# Patient Record
Sex: Male | Born: 1940 | Race: White | Hispanic: No | Marital: Married | Smoking: Former smoker
Health system: Southern US, Community
[De-identification: ages and names within clinical notes are randomized; demographics above are authoritative.]

## PROBLEM LIST (undated history)

## (undated) DIAGNOSIS — Z992 Dependence on renal dialysis: Secondary | ICD-10-CM

## (undated) DIAGNOSIS — I2510 Atherosclerotic heart disease of native coronary artery without angina pectoris: Secondary | ICD-10-CM

## (undated) DIAGNOSIS — E119 Type 2 diabetes mellitus without complications: Secondary | ICD-10-CM

## (undated) DIAGNOSIS — Z9289 Personal history of other medical treatment: Secondary | ICD-10-CM

## (undated) DIAGNOSIS — J189 Pneumonia, unspecified organism: Secondary | ICD-10-CM

## (undated) DIAGNOSIS — I739 Peripheral vascular disease, unspecified: Secondary | ICD-10-CM

## (undated) DIAGNOSIS — I350 Nonrheumatic aortic (valve) stenosis: Secondary | ICD-10-CM

## (undated) DIAGNOSIS — I219 Acute myocardial infarction, unspecified: Secondary | ICD-10-CM

## (undated) DIAGNOSIS — J449 Chronic obstructive pulmonary disease, unspecified: Secondary | ICD-10-CM

## (undated) DIAGNOSIS — I639 Cerebral infarction, unspecified: Secondary | ICD-10-CM

## (undated) DIAGNOSIS — I209 Angina pectoris, unspecified: Secondary | ICD-10-CM

## (undated) DIAGNOSIS — I4891 Unspecified atrial fibrillation: Secondary | ICD-10-CM

## (undated) DIAGNOSIS — Z952 Presence of prosthetic heart valve: Secondary | ICD-10-CM

## (undated) DIAGNOSIS — Z950 Presence of cardiac pacemaker: Secondary | ICD-10-CM

## (undated) DIAGNOSIS — I442 Atrioventricular block, complete: Secondary | ICD-10-CM

## (undated) DIAGNOSIS — N186 End stage renal disease: Secondary | ICD-10-CM

## (undated) DIAGNOSIS — D509 Iron deficiency anemia, unspecified: Secondary | ICD-10-CM

## (undated) DIAGNOSIS — E785 Hyperlipidemia, unspecified: Secondary | ICD-10-CM

## (undated) DIAGNOSIS — Z8719 Personal history of other diseases of the digestive system: Secondary | ICD-10-CM

## (undated) DIAGNOSIS — Z8711 Personal history of peptic ulcer disease: Secondary | ICD-10-CM

## (undated) DIAGNOSIS — I10 Essential (primary) hypertension: Secondary | ICD-10-CM

## (undated) DIAGNOSIS — F40240 Claustrophobia: Secondary | ICD-10-CM

## (undated) DIAGNOSIS — I509 Heart failure, unspecified: Secondary | ICD-10-CM

## (undated) HISTORY — PX: CARDIAC CATHETERIZATION: SHX172

## (undated) HISTORY — DX: Peripheral vascular disease, unspecified: I73.9

## (undated) HISTORY — DX: Essential (primary) hypertension: I10

## (undated) HISTORY — DX: Acute myocardial infarction, unspecified: I21.9

## (undated) HISTORY — PX: COLONOSCOPY W/ BIOPSIES AND POLYPECTOMY: SHX1376

## (undated) HISTORY — DX: Hyperlipidemia, unspecified: E78.5

## (undated) HISTORY — DX: Unspecified atrial fibrillation: I48.91

## (undated) HISTORY — PX: TONSILLECTOMY: SUR1361

## (undated) HISTORY — PX: CORONARY ANGIOPLASTY: SHX604

---

## 1979-07-28 HISTORY — PX: CHOLECYSTECTOMY OPEN: SUR202

## 1989-07-27 HISTORY — PX: CATARACT EXTRACTION W/ INTRAOCULAR LENS  IMPLANT, BILATERAL: SHX1307

## 1998-06-21 ENCOUNTER — Emergency Department (HOSPITAL_COMMUNITY): Admission: EM | Admit: 1998-06-21 | Discharge: 1998-06-21 | Payer: Self-pay | Admitting: Emergency Medicine

## 1998-10-07 ENCOUNTER — Encounter: Payer: Self-pay | Admitting: Emergency Medicine

## 1998-10-07 ENCOUNTER — Emergency Department (HOSPITAL_COMMUNITY): Admission: EM | Admit: 1998-10-07 | Discharge: 1998-10-08 | Payer: Self-pay | Admitting: Emergency Medicine

## 1999-06-20 ENCOUNTER — Ambulatory Visit (HOSPITAL_COMMUNITY): Admission: RE | Admit: 1999-06-20 | Discharge: 1999-06-20 | Payer: Self-pay | Admitting: *Deleted

## 1999-06-20 ENCOUNTER — Encounter: Payer: Self-pay | Admitting: *Deleted

## 1999-10-12 ENCOUNTER — Emergency Department (HOSPITAL_COMMUNITY): Admission: EM | Admit: 1999-10-12 | Discharge: 1999-10-12 | Payer: Self-pay | Admitting: Emergency Medicine

## 1999-10-12 ENCOUNTER — Encounter: Payer: Self-pay | Admitting: Emergency Medicine

## 2003-04-11 ENCOUNTER — Inpatient Hospital Stay (HOSPITAL_COMMUNITY): Admission: EM | Admit: 2003-04-11 | Discharge: 2003-04-14 | Payer: Self-pay | Admitting: Emergency Medicine

## 2003-04-11 ENCOUNTER — Encounter: Payer: Self-pay | Admitting: Emergency Medicine

## 2003-04-12 ENCOUNTER — Encounter: Payer: Self-pay | Admitting: Family Medicine

## 2003-04-13 ENCOUNTER — Encounter: Payer: Self-pay | Admitting: *Deleted

## 2005-11-20 ENCOUNTER — Emergency Department (HOSPITAL_COMMUNITY): Admission: EM | Admit: 2005-11-20 | Discharge: 2005-11-20 | Payer: Self-pay | Admitting: Emergency Medicine

## 2005-11-27 ENCOUNTER — Encounter: Admission: RE | Admit: 2005-11-27 | Discharge: 2005-11-27 | Payer: Self-pay | Admitting: Nephrology

## 2007-08-28 ENCOUNTER — Ambulatory Visit (HOSPITAL_COMMUNITY): Admission: RE | Admit: 2007-08-28 | Discharge: 2007-08-29 | Payer: Self-pay | Admitting: Ophthalmology

## 2007-08-28 ENCOUNTER — Encounter (INDEPENDENT_AMBULATORY_CARE_PROVIDER_SITE_OTHER): Payer: Self-pay | Admitting: Ophthalmology

## 2007-11-13 DIAGNOSIS — D638 Anemia in other chronic diseases classified elsewhere: Secondary | ICD-10-CM | POA: Insufficient documentation

## 2009-01-10 ENCOUNTER — Emergency Department (HOSPITAL_COMMUNITY): Admission: EM | Admit: 2009-01-10 | Discharge: 2009-01-10 | Payer: Self-pay | Admitting: Emergency Medicine

## 2009-01-14 ENCOUNTER — Inpatient Hospital Stay (HOSPITAL_COMMUNITY): Admission: EM | Admit: 2009-01-14 | Discharge: 2009-01-16 | Payer: Self-pay | Admitting: Emergency Medicine

## 2009-08-07 ENCOUNTER — Emergency Department (HOSPITAL_COMMUNITY): Admission: EM | Admit: 2009-08-07 | Discharge: 2009-08-07 | Payer: Self-pay | Admitting: Emergency Medicine

## 2009-08-14 ENCOUNTER — Encounter: Admission: RE | Admit: 2009-08-14 | Discharge: 2009-08-14 | Payer: Self-pay | Admitting: Orthopedic Surgery

## 2009-09-02 ENCOUNTER — Emergency Department (HOSPITAL_COMMUNITY): Admission: EM | Admit: 2009-09-02 | Discharge: 2009-09-02 | Payer: Self-pay | Admitting: Emergency Medicine

## 2009-09-12 ENCOUNTER — Emergency Department (HOSPITAL_COMMUNITY): Admission: EM | Admit: 2009-09-12 | Discharge: 2009-09-12 | Payer: Self-pay | Admitting: Emergency Medicine

## 2009-12-05 DIAGNOSIS — J309 Allergic rhinitis, unspecified: Secondary | ICD-10-CM | POA: Insufficient documentation

## 2010-01-19 DIAGNOSIS — I10 Essential (primary) hypertension: Secondary | ICD-10-CM | POA: Insufficient documentation

## 2010-11-16 ENCOUNTER — Encounter
Admission: RE | Admit: 2010-11-16 | Discharge: 2010-11-16 | Payer: Self-pay | Source: Home / Self Care | Attending: Family Medicine | Admitting: Family Medicine

## 2011-03-13 LAB — BASIC METABOLIC PANEL
BUN: 27 mg/dL — ABNORMAL HIGH (ref 6–23)
CO2: 26 mEq/L (ref 19–32)
Calcium: 8.4 mg/dL (ref 8.4–10.5)
Chloride: 98 mEq/L (ref 96–112)
GFR calc Af Amer: 48 mL/min — ABNORMAL LOW (ref >60)
GFR calc non Af Amer: 49 mL/min — ABNORMAL LOW (ref >60)
Glucose, Bld: 244 mg/dL — ABNORMAL HIGH (ref 70–99)
Potassium: 3.9 mEq/L (ref 3.5–5.1)
Potassium: 4.2 mEq/L (ref 3.5–5.1)
Sodium: 132 mEq/L — ABNORMAL LOW (ref 135–145)
Sodium: 134 mEq/L — ABNORMAL LOW (ref 135–145)

## 2011-03-13 LAB — CBC
HCT: 36.5 % — ABNORMAL LOW (ref 39.0–52.0)
HCT: 36.6 % — ABNORMAL LOW (ref 39.0–52.0)
Hemoglobin: 13.1 g/dL (ref 13.0–17.0)
MCHC: 34.8 g/dL (ref 30.0–36.0)
MCHC: 35.9 g/dL (ref 30.0–36.0)
MCV: 91.6 fL (ref 78.0–100.0)
Platelets: 244 10*3/uL (ref 150–400)
RBC: 3.98 MIL/uL — ABNORMAL LOW (ref 4.22–5.81)
RBC: 4.45 MIL/uL (ref 4.22–5.81)
RDW: 12.5 % (ref 11.5–15.5)
WBC: 11.5 10*3/uL — ABNORMAL HIGH (ref 4.0–10.5)
WBC: 7.3 10*3/uL (ref 4.0–10.5)
WBC: 7.7 10*3/uL (ref 4.0–10.5)

## 2011-03-13 LAB — GLUCOSE, CAPILLARY
Glucose-Capillary: 173 mg/dL — ABNORMAL HIGH (ref 70–99)
Glucose-Capillary: 174 mg/dL — ABNORMAL HIGH (ref 70–99)
Glucose-Capillary: 248 mg/dL — ABNORMAL HIGH (ref 70–99)
Glucose-Capillary: 258 mg/dL — ABNORMAL HIGH (ref 70–99)
Glucose-Capillary: 273 mg/dL — ABNORMAL HIGH (ref 70–99)
Glucose-Capillary: 281 mg/dL — ABNORMAL HIGH (ref 70–99)

## 2011-03-13 LAB — LIPID PANEL
Cholesterol: 146 mg/dL (ref 0–200)
HDL: 22 mg/dL — ABNORMAL LOW (ref >39)
LDL Cholesterol: 82 mg/dL (ref 0–99)
Total CHOL/HDL Ratio: 6.6 RATIO

## 2011-03-13 LAB — CULTURE, BLOOD (ROUTINE X 2)

## 2011-03-13 LAB — POCT I-STAT, CHEM 8
BUN: 38 mg/dL — ABNORMAL HIGH (ref 6–23)
Calcium, Ion: 1.11 mmol/L — ABNORMAL LOW (ref 1.12–1.32)
Creatinine, Ser: 1.8 mg/dL — ABNORMAL HIGH (ref 0.4–1.5)
Glucose, Bld: 256 mg/dL — ABNORMAL HIGH (ref 70–99)
TCO2: 25 mmol/L (ref 0–100)

## 2011-03-13 LAB — DIFFERENTIAL
Basophils Absolute: 0.2 10*3/uL — ABNORMAL HIGH (ref 0.0–0.1)
Basophils Relative: 2 % — ABNORMAL HIGH (ref 0–1)
Eosinophils Relative: 2 % (ref 0–5)
Lymphocytes Relative: 19 % (ref 12–46)

## 2011-03-13 LAB — SEDIMENTATION RATE: Sed Rate: 53 mm/hr — ABNORMAL HIGH (ref 0–16)

## 2011-03-13 LAB — TSH: TSH: 3.659 u[IU]/mL (ref 0.350–4.500)

## 2011-03-13 LAB — BODY FLUID CULTURE

## 2011-03-14 ENCOUNTER — Encounter: Payer: Self-pay | Admitting: Physician Assistant

## 2011-04-10 NOTE — Op Note (Signed)
NAMEDANDRAE, BOWLING               ACCOUNT NO.:  [id number]   MEDICAL RECORD NO.:  [id number]          PATIENT TYPE:  OIB   LOCATION:  2550                         FACILITY:  [facility]   PHYSICIAN:  Chrystie Nose. Zigmund Daniel, M.D. DATE OF BIRTH:  1941/02/16   DATE OF PROCEDURE:  08/28/2007  DATE OF DISCHARGE:                               OPERATIVE REPORT   ADMISSION DIAGNOSES:  1. Dislocated intraocular lens in the vitreous, right eye.  2. Diabetic retinopathy, right eye.  3. Retained lens material, right eye.   PROCEDURES:  Pars plana vitrectomy with 25-gauge system, removal of  intraocular lens from the vitreous, panretinal photocoagulation,  secondary intraocular lens placement with suture in the right eye.   SURGEON:  Chrystie Nose. Zigmund Daniel, M.D.   ASSISTANT:  Thurman Coyer. Lundquist, P.A.   ANESTHESIA:  General.   DETAILS:  Usual prep and drape, limbal peritomy from 8 o'clock to 4  o'clock.  Scleral flaps raised at 3 o'clock and 9 o'clock in  anticipation of IOL suture, 25 gauge trocars placed at 8, 10 and 2  o'clock.  A three-layered diamond knife corneoscleral wound was created  from 10 o'clock to 2 o'clock, incorporating the previous cataract wound.  Pars plana vitrectomy was begun just behind the pupillary axis.  The 25-  gauge probe was placed in the anterior chamber and removal of lens  material was performed from this area.  The vitrectomy was carried  posteriorly, where additional lens material was seen and removed with  the 25-gauge system.  The vitrectomy was carried out to the periphery,  where the intraocular lens was found.  The lens was ensnarled in  vitreous.  The 25-gauge system was used to remove the vitreous carefully  from the intraocular lens and free it in the vitreous cavity.  The  vitrectomy was carried out to the vitreous base, where the vitreous was  trimmed.  The endolaser was positioned in the eye, 2666 burns were  placed around the retinal periphery with a power  between 400 and 1500  milliwatts, 1000 microns each and 0.1 second each.  A full panretinal  photocoagulation was performed..  The corneoscleral wound was opened  with a diamond knife.  The intraocular lens was passed with 25-gauge  forceps through the pupil into the anterior chamber, where it was  grasped with 20-gauge serrated forceps and removed from the eye.  The  lens was sent for gross study.  A new intraocular lens made by [employer], [id number], power 22.0 D, length 12.5 mm, optic 7.0 mm,  serial number [id number], was brought onto the field.  The expiration  date was October 2012.  The lens was inspected and cleaned.  Two Prolene  sutures were passed beneath the scleral flaps at 3 and 9 o'clock behind  the iris and anterior to the capsular remnants.  The sutures were  externalized through the corneoscleral wound, attached to the  intraocular lens, and the intraocular lens was passed into the posterior  chamber and dialed into place.  The sutures were knotted externally  beneath the scleral flaps.  The corneoscleral wound was closed with four  10-0 interrupted nylon sutures.  Additional vitrectomy was performed to  remove hemorrhage from the vitreous cavity and the retinal surface.  Some additional lens fragments were seen and removed.  An 80% gas-fluid  exchange was performed.  The 25-gauge trocars were removed from the eye.  The wounds were tested and found to be tight.  The conjunctiva was  closed with 7-0 chromic suture.  Polymyxin and gentamicin were irrigated  into [place]'s, Marcaine was injected around the globe for postop pain.  Decadron 10 mg was injected into the lower subconjunctival space.  TobraDex ophthalmic ointment, a patch and shield were placed.  The  closing pressure was 10 with a Barraquer keratometer.   COMPLICATIONS:  None.   DURATION:  1 hour 30 minutes.   The patient was awakened and taken to recovery in satisfactory  condition.       Chrystie Nose. Zigmund Daniel, M.D.  Electronically Signed     JDM/MEDQ  D:  08/28/2007  T:  08/29/2007  Job:  [id number]

## 2011-04-10 NOTE — H&P (Signed)
NAMEHEITOR, Jeffrey [name]               ACCOUNT NO.:  [id number]   MEDICAL RECORD NO.:  [id number]          PATIENT TYPE:  INP   LOCATION:  1425                         FACILITY:  [facility]   PHYSICIAN:  Karlyn Agee, M.D. DATE OF BIRTH:  July 12, 1941   DATE OF ADMISSION:  01/13/2009  DATE OF DISCHARGE:                              HISTORY & PHYSICAL   PRIMARY CARE PHYSICIAN:  Rachell Cipro, M.D.   CHIEF COMPLAINT:  Pain and swelling and inflammation of the left ankle  and leg.   HISTORY OF PRESENT ILLNESS:  This is a 70 year old obese Caucasian  gentleman with a history of diabetes, hypertension and coronary artery  disease who got up out of bed in the dark up to 3 nights ago and slipped  and twisted his left ankle.  The patient experienced pain not only in  the ankle and foot but also up in the leg, came to the emergency room,  was evaluated, had x-rays of the ankle done which revealed no acute  fracture.  He was discharged home with pain medications.  Unfortunately,  the prescription was not signed and he could not fill the prescription.  He returned today because of ecchymosis, redness of the ankle, leg pain  extending further up the leg, and also fever and chills.  In the  emergency room, the patient was evaluated and felt to have a cellulitis  and the hospitalist service called to assist with management.  The  patient says the location of the pain has been constant since his injury  and that the pain has always been extending up his leg.  He does admit  to subjective fever and chills and he says he is completely unable to  bear weight on the foot.   He also gives a history of orthopnea and PND and dyspnea with exertion.   PAST MEDICAL HISTORY:  1. Diabetes.  2. Hypertension.  3. Hyperlipidemia.  4. Coronary artery disease.   MEDICATIONS:  1. Amlodipine 10 mg daily.  2. Aspirin 81 mg daily.  3. Glipizide 10 mg twice daily.  4. HCTZ 25 mg daily.  5. Lisinopril 20 mg twice  daily.  6. Metformin 500 mg twice daily.  7. Nitroglycerin sublingually p.r.n.  8. Simvastatin 20 mg at bedtime.   ALLERGIES:  BYETTA causes nausea.   SOCIAL HISTORY:  Denies tobacco, alcohol or illicit drug use.  He is a  retired Nature conservation officer.   FAMILY HISTORY:  Significant for diabetes and hypertension.   REVIEW OF SYSTEMS:  Other than noted above, a 10-point review of systems  was unremarkable.   PHYSICAL EXAM:  Mildly obese Caucasian gentleman reclining in the  stretcher in no acute distress at this time.  VITALS:  His temperature is 98.0, pulse 104, respirations 20, blood  pressure 136/94, he is saturating at 95% on room air.  Pupils are round  and equal, mucous membranes pink and anicteric, no cervical  lymphadenopathy or thyromegaly noted.  No carotid bruits.  CHEST:  Clear to auscultation bilaterally.  CARDIOVASCULAR SYSTEM:  Regular rhythm.  ABDOMEN:  Obese and soft.  He has  right upper quadrant incisional hernia  stemming  from his cholecystectomy.  EXTREMITIES:  He has edema of both legs to just below the knees, left  greater than right.  He has extensive ecchymosis around the left ankle extending down onto  the dorsum of his left foot.  There is no ecchymosis of the leg.  However, it is tender up the leg towards the knee.  He is unable to flex  or dorsiflex the left foot if held in plantar flex position.  CENTRAL NERVOUS SYSTEM:  Cranial nerves II-XII are grossly intact.  He  has no focal lateralizing signs.   LABS:  His white count is 11.5, hemoglobin 14.2, platelets 310.  Absolute granulocyte count is mildly elevated at 8.1.  His serum  chemistry is significant for a low sodium of 133, elevated potassium of  5.1 and a BUN elevated at 38, creatinine 1.8.  Six months ago his BUN  and creatinine were normal.  His glucose is elevated to 256.  X-rays of  the foot revealed no evidence of arthropathy, fracture or dislocation.   ASSESSMENT:  1. Acute injury to  the left ankle and foot without any evidence of      fracture so far.  2. Acute pain and inability to ambulate.  3. Questionable cellulitis.  4. Diabetes type 2, uncontrolled.  5. Hypertension, controlled.  6. History of hyperlipidemia.  7. Acute renal failure.   PLAN:  1. Will admit this gentleman for hydration and better control of his      diabetes.  2. Will repeat plain x-rays to be sure an occult fracture does not      manifest, including x-rays of the tib/fib.  3. If there is no evidence of fracture, will do MRI of the ankle and      foot.  Will start him on Rocephin although despite the leukocytosis      and a subjective history of fever, cellulitis is not a convincing      diagnosis.  Will add sliding scale to his regimen.  Continue      glipizide and hold his metformin because of his renal failure.      Other plans as per orders.      Karlyn Agee, M.D.  Electronically Signed     LC/MEDQ  D:  01/14/2009  T:  01/14/2009  Job:  [id number]   cc:   Rachell Cipro, M.D.

## 2011-04-13 NOTE — Discharge Summary (Signed)
NAME:  Jeffrey [name], Jeffrey [name]                         ACCOUNT NO.:  [id number]   MEDICAL RECORD NO.:  [id number]                   PATIENT TYPE:  INP   LOCATION:  [id number]                                 FACILITY:  [facility]   PHYSICIAN:  Benito Mccreedy, M.D.             DATE OF BIRTH:  [birth year]   DATE OF ADMISSION:  04/11/2003  DATE OF DISCHARGE:  04/14/2003                                 DISCHARGE SUMMARY   DISCHARGE DIAGNOSES:  1. Chest pain, resolved.  No evidence of ischemia.  Cardiolite done on Apr 13, 2003.  2. History of ataxia, transient, possibly secondary to transient ischemic     attack.  Outpatient open MRI/MRA pending.  Outpatient follow-up and     complete workup recommended per primary care physician of patient.  3. Questionable history of myocardial infarction in the past.  4. Diabetes type 2.  5. Hypertension.  6. History of medical noncompliance.   DISCHARGE MEDICATIONS:  1. Aspirin 325 mg p.o. daily.  2. Glucotrol 10 mg p.o. b.i.d.  3. Lopressor 25 b.i.d.  4. Norvasc 10 daily.  5. Zestril 10 mg b.i.d.  6. The patient's Glucophage is currently on hold.   He had a Cardiolite test and, however, on discharge his CBGs have been less  than 200 mg/dl.   ACTIVITY:  As tolerated.   DIET:  Should be 2000-calorie ADA diet and cardiac diet.   DISCHARGE INSTRUCTIONS:  The patient is to report to M.D. if he experiences  any problems.   FOLLOW-UP:  He is to follow-up with Dr. Don Broach office in about two weeks.  The patient is to call for appointment.  Open MRI has been arranged for the  patient at 3:45 p.m. on Apr 23, 2003.  The patient is to call Linus Orn at [phone], and he has been furnished with this number.   HISTORY OF PRESENT ILLNESS:  The patient presented to the ED on the day of  admission with substernal chest pain following a heated argument with his  son.  He was also noted to have had some blurred vision and ataxia with left  facial numbness during  this period.  He was concerned that he might have had  some stroke.  His blood pressure was elevated at 182/89 in the ED during  admission evaluation.  He also had some mild headache, nausea, vomiting, and  indigestion which was subsequently relieved with Tums.  He complained of  generalized weakness and palpitations.  Cardiac and pulmonary exams were  unremarkable at the time of admission.  His EKG revealed some bradycardia  with third-degree AV block without any acute ST and T-wave changes.  On  account of his coronary disease and risk factors as well as questionable  history of prior myocardial infarction, the patient was admitted for further  evaluation.   HOSPITAL COURSE:  The patient was admitted to the hospitalist service.  Serial cardiac enzymes were obtained.  There was no evidence of myocardial  infarction.  He did not have any significant arrhythmias on telemetry  monitoring.  His symptoms resolved, both chest pain and neurologic symptoms,  spontaneously within less than 24 hours.  Cardiology was consulted, and  Cardiolite test was done.  Results are as indicated above.  They are  concerned about possible transient ischemic attack.  MRI was scheduled for  the patient, but he refused the test because he was claustrophobic.  We  tried to do MRI under sedation at [facility], which he also refused  for fear that he may wake up in the middle of the test and be scared to  death.  The patient is currently hemodynamically stable.  He does not  exhibit any neurologic symptoms.  He is being discharged home on aspirin and  his other medications.  He is to follow up with his primary care physician  for further workup of his neurologic complaints.  He is to get his open MRI  done as stated above.  The patient is discharged home in stable condition  __________.   PROCEDURE:  Cardiolite test, as mentioned above.   CONDITION ON DISCHARGE:  Stable and improved.   DISCHARGE  LABORATORY DATA:  WBC count 6.8, hemoglobin 14.4, hematocrit 40.4,  MCV 89.6, platelet count 218.  Sodium 135, potassium 3.7, chloride 105, CO2  27, glucose 161, BUN 12, creatinine 0.9, calcium 9.2.  Hemoglobin A1C 7.2%.  Total cholesterol 157, triglycerides 293, ADL 33, LDL 55.  Homocystine level  8.27 mcmol/L.                                               Benito Mccreedy, M.D.    GO/MEDQ  D:  04/14/2003  T:  04/15/2003  Job:  [id number]   cc:   Fabio Asa, M.D.  [place] [address]  Fax: [phone]   Ples Specter. Henrene Pastor, M.D.  [address]  [place] [address]  Fax: [phone]

## 2011-04-13 NOTE — Consult Note (Signed)
NAME:  Jeffrey [name], Jeffrey [name] NO.:  [id number]   MEDICAL RECORD NO.:  [id number]                   PATIENT TYPE:  INP   LOCATION:  [id number]                                 FACILITY:  [facility]   PHYSICIAN:  Fabio Asa, M.D.                 DATE OF BIRTH:  Jun 18, 1941   DATE OF CONSULTATION:  04/12/2003  DATE OF DISCHARGE:                                   CONSULTATION   REPORT TITLE:  CARDIOLOGY CONSULTATION.   REFERRING PHYSICIAN:  Eber Hong. Geni Bers, M.D.   INDICATION FOR CONSULTATION:  Chest pain.   HISTORY OF PRESENT ILLNESS:  The patient is a 70 year old gentleman known to  me from previous evaluation in 2001. He presented to the emergency room with  complaints of substernal chest pain following a heated argument with his  son. He was also noted to have blurred vision, ataxia and left facial  numbness. The patient was concerned that he may have had a stroke. He was  noted to have an elevated blood pressure of 182/89 in the emergency room.  His chief complaint upon arrival to the emergency room was mild headache,  nausea, vomiting and indigestion relieved by TUMS.  He had generalized  weakness and palpitations.   The patient relates to me that he did not keep his previous scheduled  cardiac catheterization appointment secondary to concerns regarding possible  risk. He has had increasing dyspnea. Serial cardiac enzymes have been  negative x3. His ECG reveals a bradycardiac rhythm with first degree AV  block. There are no acute ST changes noted. The patient described the  symptom as if someone was squeezing the breath out of him.   PAST MEDICAL HISTORY:  1. Significant for questionable myocardial infarction in the past.  2. Diabetes mellitus type 2.  3. Hypertension.  4. Medical noncompliance.   ALLERGIES:  No known drug allergies.   CURRENT MEDICATIONS:  1. Prinivil 10 mg daily.  2. Glucophage 1 g b.i.d.  3. Glucotrol XL 10 mg b.i.d.  4.  Enteric-coated aspirin 81 mg daily.   PAST SURGICAL HISTORY:  Significant for cholecystectomy.   SOCIAL HISTORY:  He is married. Stopped smoking 16 years prior. He had been  smoking up to four packs per day. No history of alcohol or illicit drug use.  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 He states that he climbs a 40 foot ladder and has had no  chest pain with this type of exertion.   FAMILY HISTORY:  Father had a CVA, insulin-dependent diabetes mellitus,  hypertension, and MI. Mother had a CVA and dementia.   REVIEW OF SYSTEMS:  Is as per HPI. He has had a recent left thumb injury  related to his circular saw.   PHYSICAL EXAMINATION:  GENERAL:  An elderly male, appearing older than his  stated age.  VITAL SIGNS:  Blood pressure 187/110, pulse 60, respiratory rate 20.  Telemetry reveals sinus rhythm to sinus bradycardia.  HEENT:  Unremarkable.  NECK:  No carotid bruits.  PULMONARY:  Examination reveals a large chest. Breath sounds are slightly  diminished.  CARDIAC:  Regular rate and rhythm. Heart tones are distant. There is no  appreciable murmur.  ABDOMEN:  Obese. Examination is made difficult by body habitus. There are  normal bowel sounds.  EXTREMITIES:  Reveal distal pulses which are equal and palpable. There is no  peripheral edema noted.  NEUROLOGICAL:  Nonfocal.  SKIN:  Warm and dry.   LABORATORY DATA:  White count 6.8, hematocrit 40.4.  Potassium 3.7,  creatinine 0.7. Serial cardiac enzymes negative as noted above.   ECG as noted above.   IMPRESSION:  1. This is a 70 year old gentleman with previous inferior defect     demonstrated on Cardiolite. Left heart catheterization recommended at     that time. The patient was hesitant to proceed. He remains hesitant to     proceed with cardiac catheterization. We discussed his options. Will     repeat his adenosine/Cardiolite. If his inferior defect is unchanged,      will not recommend further aggressive intervention and it sounds as if he     has activity limited primarily by motivation. He understands his comorbid     conditions including his diabetes, hypertension, and obesity are     contributing to coronary artery disease.  2. Hypertension. Blood pressure currently is poorly controlled. Would     increase both his Norvasc to 10 mg daily as well as his Lisinopril to 20     mg daily.  3. Dyslipidemia. If a recent cholesterol profile has not been obtained, his     LDL goal is less than 100, further recommendations pending the outcome of     his adenosine/Cardiolite.                                               Fabio Asa, M.D.    HP/MEDQ  D:  04/12/2003  T:  04/12/2003  Job:  [id number]

## 2011-08-04 ENCOUNTER — Emergency Department (HOSPITAL_COMMUNITY)
Admission: EM | Admit: 2011-08-04 | Discharge: 2011-08-04 | Disposition: A | Discharge status: Home / Self Care | Payer: Medicare Other | Attending: Emergency Medicine | Admitting: Emergency Medicine

## 2011-08-04 ENCOUNTER — Emergency Department (HOSPITAL_COMMUNITY): Payer: Medicare Other

## 2011-08-04 DIAGNOSIS — S61209A Unspecified open wound of unspecified finger without damage to nail, initial encounter: Secondary | ICD-10-CM | POA: Insufficient documentation

## 2011-08-04 DIAGNOSIS — I10 Essential (primary) hypertension: Secondary | ICD-10-CM | POA: Insufficient documentation

## 2011-08-04 DIAGNOSIS — E119 Type 2 diabetes mellitus without complications: Secondary | ICD-10-CM | POA: Insufficient documentation

## 2011-08-04 DIAGNOSIS — Y92009 Unspecified place in unspecified non-institutional (private) residence as the place of occurrence of the external cause: Secondary | ICD-10-CM | POA: Insufficient documentation

## 2011-08-04 DIAGNOSIS — W298XXA Contact with other powered powered hand tools and household machinery, initial encounter: Secondary | ICD-10-CM | POA: Insufficient documentation

## 2011-08-05 NOTE — Consult Note (Signed)
NAMEJAYGER, SCHMOCK NO.:  [id number]  MEDICAL RECORD NO.:  [id number]  LOCATION:  WLED                         FACILITY:  [facility]  PHYSICIAN:  Leanora Cover, MD        DATE OF BIRTH:  [birth year]  DATE OF CONSULTATION:  08/04/2011 DATE OF DISCHARGE:  08/04/2011                                CONSULTATION   Consult is from [facility] Emergency Department.  Consult is for left thumb table saw injury.  HISTORY:  Mr. Poyer is a 70 year old right-hand dominant male who states he was ripping a piece of wood when he cut him with a table saw, when the saw tipped lacerating his left thumb on the blade.  He was working in his own home.  He suffered a laceration to the volar aspect of the IP joint of the left thumb.  He came to [facility] Emergency Department where he was evaluated.  He was felt to have a tendon injury, was consulted for management of the condition.  Reports no previous injuries to the thumb and no other injuries at this time.  ALLERGIES:  No known drug allergies.  PAST MEDICAL HISTORY: 1. Diabetes. 2. Hypercholesterolemia. 3. Hypertension.  PAST SURGICAL HISTORY:  Cholecystectomy and lower back surgery.  MEDICATIONS:  Metformin, glipizide, amlodipine, Losartan, Januvia, and simvastatin.  SOCIAL HISTORY:  Mr. Colberg does not smoke and does not use alcohol.  FAMILY HISTORY:  Positive for diabetes and hypertension.  REVIEW OF SYSTEMS:  13-point review of systems negative.  PHYSICAL EXAMINATION:  VITAL SIGNS:  Temperature 98.3, pulse 87, respirations 20, and BP 144/73. HEAD:  Normocephalic and atraumatic. NECK:  Supple, full range of motion.  He is resting comfortably in the ER exam room.  He is cooperative with examination.  He is not complaining of any pain. EXTREMITIES:  Right upper extremity has intact sensation, capillary refill in all fingertips.  He can flex and extend the IP joint of thumb and cross his fingers.  No wounds.  No  tenderness to palpation.  Left upper extremity with the exception of the thumb, he has no wounds and no tenderness to palpation.  He has intact sensation and capillary refill in the fingertips.  He has full flexion and extension of the digits.  In the thumb, he has a laceration at the volar aspect of the IP joint of the thumb.  There is a small amount of skin loss.  He has decreased sensation in the tip of the thumb.  He has brisk capillary refill.  He is unable to flex at the IP joint.  Placing the IP joint in flexion, he is unable to maintain it there.  RADIOGRAPHY:  AP, lateral, and oblique views of the thumb showed no fractures, dislocations, or radiopaque foreign bodies.  ASSESSMENT AND PLAN:  Left thumb laceration with flexor pollicis longus laceration, possible digital nerve lacerations.  Discussed with Mr. Slader, the nature of the injury.  At this time, I recommended irrigation and debridement of wound in the emergency department with followup early this week for surgical planning for potential tendon and nerve repair.  PROCEDURE:  Digital block was performed with 10 cc of half  solution of 1% plain lidocaine and 0.5% plain Marcaine.  This is adequate to give total digital anesthesia to the left thumb.  The wound was copiously irrigated with sterile saline and Betadine solution.  1000 cc was used. There is no gross contamination.  5-0 or 4-0 nylon suture was used to oppose the skin edges.  There was good coverage of deeper tissues.  The wound was dressed with sterile Xeroform and 2 x 2's, and wrapped with a Kling bandage.  A thumb spica splint with the wrist and 30 degrees of flexion in the thumb in oppositions was placed.  The thumb tip was pink with brisk capillary refill after procedure.  I will give him Percocet 5/325 1-2 p.o. q.6 hours p.r.n. pain, dispensed #30 and doxycycline 100 mg p.o. b.i.d. x7 days.  I will follow up with him in the office on Monday for surgical  plan.     Leanora Cover, MD     KK/MEDQ  D:  08/04/2011  T:  08/04/2011  Job:  [id number]  Electronically Signed by Leanora Cover  on 08/05/2011 05:07:34 PM

## 2011-08-06 ENCOUNTER — Ambulatory Visit
Admission: RE | Admit: 2011-08-06 | Discharge: 2011-08-06 | Disposition: A | Discharge status: Home / Self Care | Payer: Medicare Other | Source: Ambulatory Visit | Attending: Orthopedic Surgery | Admitting: Orthopedic Surgery

## 2011-08-06 ENCOUNTER — Other Ambulatory Visit: Payer: Self-pay | Admitting: Orthopedic Surgery

## 2011-08-06 ENCOUNTER — Encounter (HOSPITAL_BASED_OUTPATIENT_CLINIC_OR_DEPARTMENT_OTHER)
Admission: RE | Admit: 2011-08-06 | Discharge: 2011-08-06 | Disposition: A | Discharge status: Home / Self Care | Payer: Medicare Other | Source: Ambulatory Visit | Attending: Orthopedic Surgery | Admitting: Orthopedic Surgery

## 2011-08-06 DIAGNOSIS — Z01811 Encounter for preprocedural respiratory examination: Secondary | ICD-10-CM

## 2011-08-06 LAB — BASIC METABOLIC PANEL
BUN: 27 mg/dL — ABNORMAL HIGH (ref 6–23)
Calcium: 9.7 mg/dL (ref 8.4–10.5)
GFR calc non Af Amer: 56 mL/min — ABNORMAL LOW (ref >60)
Glucose, Bld: 209 mg/dL — ABNORMAL HIGH (ref 70–99)
Sodium: 136 mEq/L (ref 135–145)

## 2011-08-07 ENCOUNTER — Emergency Department (HOSPITAL_COMMUNITY)
Admission: EM | Admit: 2011-08-07 | Discharge: 2011-08-07 | Disposition: A | Discharge status: Home / Self Care | Payer: Medicare Other | Attending: Emergency Medicine | Admitting: Emergency Medicine

## 2011-08-07 ENCOUNTER — Ambulatory Visit (HOSPITAL_BASED_OUTPATIENT_CLINIC_OR_DEPARTMENT_OTHER)
Admission: RE | Admit: 2011-08-07 | Discharge: 2011-08-07 | Disposition: A | Discharge status: Home / Self Care | Payer: Medicare Other | Source: Ambulatory Visit | Attending: Orthopedic Surgery | Admitting: Orthopedic Surgery

## 2011-08-07 DIAGNOSIS — W298XXA Contact with other powered powered hand tools and household machinery, initial encounter: Secondary | ICD-10-CM | POA: Insufficient documentation

## 2011-08-07 DIAGNOSIS — Z01812 Encounter for preprocedural laboratory examination: Secondary | ICD-10-CM | POA: Insufficient documentation

## 2011-08-07 DIAGNOSIS — E7800 Pure hypercholesterolemia, unspecified: Secondary | ICD-10-CM | POA: Insufficient documentation

## 2011-08-07 DIAGNOSIS — S61209A Unspecified open wound of unspecified finger without damage to nail, initial encounter: Secondary | ICD-10-CM | POA: Insufficient documentation

## 2011-08-07 DIAGNOSIS — Z01810 Encounter for preprocedural cardiovascular examination: Secondary | ICD-10-CM | POA: Insufficient documentation

## 2011-08-07 DIAGNOSIS — Y998 Other external cause status: Secondary | ICD-10-CM | POA: Insufficient documentation

## 2011-08-07 DIAGNOSIS — E119 Type 2 diabetes mellitus without complications: Secondary | ICD-10-CM | POA: Insufficient documentation

## 2011-08-07 DIAGNOSIS — I10 Essential (primary) hypertension: Secondary | ICD-10-CM | POA: Insufficient documentation

## 2011-08-07 DIAGNOSIS — Z01818 Encounter for other preprocedural examination: Secondary | ICD-10-CM | POA: Insufficient documentation

## 2011-08-07 DIAGNOSIS — I441 Atrioventricular block, second degree: Secondary | ICD-10-CM | POA: Insufficient documentation

## 2011-08-07 DIAGNOSIS — Y92009 Unspecified place in unspecified non-institutional (private) residence as the place of occurrence of the external cause: Secondary | ICD-10-CM | POA: Insufficient documentation

## 2011-08-07 DIAGNOSIS — IMO0002 Reserved for concepts with insufficient information to code with codable children: Secondary | ICD-10-CM | POA: Insufficient documentation

## 2011-08-07 DIAGNOSIS — S65509A Unspecified injury of blood vessel of unspecified finger, initial encounter: Secondary | ICD-10-CM | POA: Insufficient documentation

## 2011-08-07 LAB — GLUCOSE, CAPILLARY
Glucose-Capillary: 212 mg/dL — ABNORMAL HIGH (ref 70–99)
Glucose-Capillary: 163 mg/dL — ABNORMAL HIGH (ref 70–99)
Glucose-Capillary: 184 mg/dL — ABNORMAL HIGH (ref 70–99)

## 2011-08-07 LAB — POCT HEMOGLOBIN-HEMACUE: Hemoglobin: 13.3 g/dL (ref 13.0–17.0)

## 2011-08-08 NOTE — Op Note (Signed)
[name], [name]               ACCOUNT NO.:  [id number]  MEDICAL RECORD NO.:  [id number]  LOCATION:  [facility]                         FACILITY:  [facility]  PHYSICIAN:  Leanora Cover, MD        DATE OF BIRTH:  01-15-1941  DATE OF PROCEDURE:  08/07/2011 DATE OF DISCHARGE:  08/07/2011                              OPERATIVE REPORT   PREOPERATIVE DIAGNOSIS:  Left thumb flexor pollicis longus and digital nerve lacerations.  POSTOPERATIVE DIAGNOSIS:  Left thumb flexor pollicis longus, radial and ulnar digital nerve laceration, and radial digital artery laceration.  PROCEDURE:  Left thumb:  Repair of FPL, repair of radial and ulnar digital nerves, and repair of radial digital artery.  SURGEON:  Leanora Cover, MD  ASSISTANT:  Daryll Brod, MD  ANESTHESIA:  General with regional.  IV FLUIDS:  Per anesthesia flow sheet.  ESTIMATED BLOOD LOSS:  Minimal.  COMPLICATIONS:  None.  SPECIMENS:  None.  TOURNIQUET TIME:  81 minutes.  DISPOSITION:  Stable to PACU.  INDICATIONS:  Jeffrey [name] is a 70 year old male who lacerated his left thumb at the volar aspect of the IP joint on a table saw on Saturday. He presented to the [facility] Emergency Department where he was evaluated. He had decreased sensation of the thumb tip and inability to flex the IP joint.  The wound was irrigated and debrided and surgery planned for the outpatient surgical center.  Risks, benefits, and alternatives of surgery were discussed including the risk of blood loss; infection; damage to nerves, vessels, tendons, ligaments, bone; failure to surgery; need for additional surgery; complications with wound healing; continued pain and stiffness.  He voiced understanding of these risks and elected to proceed.  OPERATIVE COURSE:  After being identified preoperatively by myself, the patient and I agreed upon the procedure and site of procedure.  The surgical site was marked.  Risks, benefits, and alternatives of surgery were  reviewed and he wished to proceed.  Surgical consent had been signed.  He was given 1 g of IV Ancef as preoperative antibiotic prophylaxis.  Regional block was performed by Anesthesia in preoperative holding.  He was transferred to the operating room and placed on the operating table in supine position with left upper extremity on an arm board.  General anesthesia was induced by the anesthesiologist.  Left upper extremity was prepped and draped in normal sterile orthopedic fashion.  A surgical pause was performed between surgeons, Anesthesia, and operating staff and all were in agreement as to the patient, procedure, and site of procedure.  Tourniquet at the proximal aspect of the extremity was inflated to 250 mmHg after exsanguinated of the limb with an Esmarch bandage.  The wound on the thumb was extended in a Brunner-type fashion.  This was carried through the subcutaneous tissues.  There was noted to be a 100% FPL laceration.  The radial and ulnar digital nerves were identified and had been lacerated as well. There was laceration to the radial digital artery.  The ulnar digital artery was intact.  This was traced through the zone of injury.  The FPL was unable to be milked out through the sheath.  An incision was made at  the wrist.  The FCR tendon sheath was incised and the FCR was retracted ulnarly.  The FPL was easily identified.  It was brought out through this wound.  The edges were cleaned.  A 2-0 Prolene suture was used in a Bunnell-type fashion.  A pediatric feeding tube was placed through the tendon sheath.  The 2-0 Prolene suture was placed through this and it was pulled back through the tendon sheath.  The tendon was brought out to the level of the distal phalanx.  There was minimal stump remaining at the distal phalanx.  The Prolene was then passed around the edges of the bone and through the dorsum of the nail.  It was tied over a button with a Webril bumper.  This apposed  the tendon to the base of the distal phalanx well.  The microscope was brought in.  The radial digital artery was cleared of clot and repaired in an interrupted circumferential fashion using 9-0 nylon suture.  The ends had been freshened.  The radial and ulnar digital artery were identified and the stumps identified.  They were very damaged.  They were freshened as best possible.  A 9-0 nylon suture was again used in an interrupted fashion to oppose the nerve ends with a very slight gap to allow for nerve healing.  The wound was irrigated with sterile saline.  The skin was closed with 4-0 nylon in a horizontal mattress fashion.  The wound at the wrist was closed as well.  The wounds were dressed with sterile Xeroform and 4x4s and wrapped with a Kerlix bandage.  A dorsal blocking splint was placed with the wrist flexed to 30 degrees in the thumb and palmar opposition.  This was wrapped with Kerlix and Ace bandage.  The tourniquet was deflated at 81 minutes.  The thumb tip was pink with brisk capillary refill after deflation of the tourniquet.  The operative drapes were broken down.  The patient was awoken from anesthesia safely. He was transferred back to the stretcher and taken to PACU in stable condition.  I will see him back in the office in 1 week for postoperative followup.  I will give him Norco 5/325 one to two p.o. q.6 h. p.r.n. pain, dispensed #50.     Leanora Cover, MD     KK/MEDQ  D:  08/07/2011  T:  08/07/2011  Job:  [id number]  Electronically Signed by Leanora Cover  on 08/08/2011 02:44:45 PM

## 2011-09-05 NOTE — Consult Note (Signed)
NAME:  Jeffrey [name], Jeffrey [name] NO.:  [id number]  MEDICAL RECORD NO.:  [id number]  LOCATION:                                 FACILITY:  PHYSICIAN:  Mali Nachum Derossett, MD         DATE OF BIRTH:  08/22/1941  DATE OF CONSULTATION: DATE OF DISCHARGE:                                CONSULTATION   Mr. Tsung is a pleasant 70 year old male who had been seen by [place] in the past.  Around 2008, he had had some chest pain.  He has multiple risk factors for coronary artery disease.  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  He has not seen [place] since 2009.  Initially, there were plans to proceed with diagnostic catheterization, but the patient never had this done.  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  He went to [facility] and had tendon repair.  While being monitored, he had some transient Wenckebach without symptoms.  His baseline EKG even from 2009 shows sinus rhythm with first-degree AV block.  He denies any chest pain or shortness of breath.  He has not had palpitations or syncope.  His current medications are not completely known, but he is apparently not on a beta-blocker or another chronotropic agent.  He is on metformin, glipizide, Zocor, and Norvasc.  PAST MEDICAL HISTORY:  Remarkable for: 1. Treated hypertension. 2. Treated dyslipidemia. 3. Type 2 non-insulin-dependent diabetes. 4. He had cholecystectomy in the [id number] which was complicated by a     gangrenous gallbladder, this required sometime to heal.  SOCIAL HISTORY:  He has been married for 42 years.  He quit smoking in 1985.  He has 3 children and 5 grandchildren.  FAMILY HISTORY:  Remarkable that his father had diabetes.  REVIEW OF SYSTEMS:  Essentially unremarkable except for noted above.  PHYSICAL EXAMINATION:  VITAL SIGNS:  Blood pressure 164/84, pulse 77, temp 97, and O2  sat is 93%. GENERAL:  He is a well-developed, overweight male in no acute distress. HEENT:  Normocephalic.  Extraocular movements are intact.  Sclerae anicteric. NECK:  Without JVD.  Does have a soft transmitted murmur to both carotids. CHEST:  Clear to auscultation and percussion. CARDIAC:  Regular rate and rhythm with a 2/6 systolic murmur at the left sternal border and aortic valve area with the preserved S2. ABDOMEN:  Obese and nontender.  He has a large right upper quadrant surgical scar. EXTREMITIES:  Without edema.  Distal pulses are intact. NEURO:  Grossly intact.  He is awake, alert, oriented, and cooperative. Moves all extremities without obvious deficit. SKIN:  Cool and dry.  IMPRESSION: 1. Asymptomatic transient Wenckebach. 2. Baseline first-degree arteriovenous block. 3. Treated hypertension. 4. Type 2 non-insulin-dependent diabetes. 5. Treated dyslipidemia. 6. Ex-smoker.  PLAN:  The patient will be seen now by Dr. Debara Pickett as an outpatient.  He may need another echocardiogram and essentially he may have progressedto mild aortic stenosis from his last echo in 2008.     Erlene Quan, P.A.  ______________________________ Mali Lucilla Petrenko, MD    LKK/MEDQ  D:  08/07/2011  T:  08/08/2011  Job:  [id number]  Electronically Signed by Kerin Ransom P.A. on 09/05/2011 09:11:01 AM Electronically Signed by K. Zsofia Prout M.D. on 09/05/2011 08:42:25 PM

## 2011-09-06 LAB — BASIC METABOLIC PANEL
Calcium: 9.2
Creatinine, Ser: 1.12
GFR calc Af Amer: >60
GFR calc non Af Amer: >60
Sodium: 138

## 2011-09-06 LAB — CBC
Hemoglobin: 12.9 — ABNORMAL LOW
MCHC: 34.9
RBC: 4.07 — ABNORMAL LOW
WBC: 5.2

## 2012-07-31 ENCOUNTER — Encounter (HOSPITAL_COMMUNITY): Payer: Self-pay | Admitting: *Deleted

## 2012-07-31 ENCOUNTER — Emergency Department (HOSPITAL_COMMUNITY)
Admission: EM | Admit: 2012-07-31 | Discharge: 2012-08-01 | Disposition: A | Discharge status: Home / Self Care | Payer: Medicare Other | Attending: Gastroenterology | Admitting: Gastroenterology

## 2012-07-31 DIAGNOSIS — E119 Type 2 diabetes mellitus without complications: Secondary | ICD-10-CM | POA: Insufficient documentation

## 2012-07-31 DIAGNOSIS — T18108A Unspecified foreign body in esophagus causing other injury, initial encounter: Secondary | ICD-10-CM | POA: Insufficient documentation

## 2012-07-31 DIAGNOSIS — Z9089 Acquired absence of other organs: Secondary | ICD-10-CM | POA: Insufficient documentation

## 2012-07-31 DIAGNOSIS — Z79899 Other long term (current) drug therapy: Secondary | ICD-10-CM | POA: Insufficient documentation

## 2012-07-31 DIAGNOSIS — I10 Essential (primary) hypertension: Secondary | ICD-10-CM | POA: Insufficient documentation

## 2012-07-31 DIAGNOSIS — IMO0002 Reserved for concepts with insufficient information to code with codable children: Secondary | ICD-10-CM | POA: Insufficient documentation

## 2012-07-31 DIAGNOSIS — G8929 Other chronic pain: Secondary | ICD-10-CM | POA: Insufficient documentation

## 2012-07-31 DIAGNOSIS — M549 Dorsalgia, unspecified: Secondary | ICD-10-CM | POA: Insufficient documentation

## 2012-07-31 DIAGNOSIS — E785 Hyperlipidemia, unspecified: Secondary | ICD-10-CM | POA: Insufficient documentation

## 2012-07-31 MED ORDER — GLUCAGON HCL (RDNA) 1 MG IJ SOLR
1.0000 mg | Freq: Once | INTRAMUSCULAR | Status: AC
  Administered 2012-07-31: 1 mg via INTRAVENOUS
  Filled 2012-07-31: qty 1

## 2012-07-31 MED ORDER — ONDANSETRON HCL 4 MG/2ML IJ SOLN
4.0000 mg | Freq: Once | INTRAMUSCULAR | Status: AC
  Administered 2012-07-31: 4 mg via INTRAVENOUS
  Filled 2012-07-31: qty 2

## 2012-07-31 NOTE — ED Notes (Signed)
Pt states ate stew with potatoes/onions/etc about 90 mins ago; feels like has a potato stuck in his throat; able to speak in full sentences but states when he tries to drink something it comes back up; no resp distress

## 2012-07-31 NOTE — ED Notes (Addendum)
Pt was eating dinner tonight approximately 8PM, food became lodged in his throat.  Difficulty swallowing now.  Four months ago pt choked on a piece of food and wife had to perform heimleich and food was dislodged.  Pt reports at that time he couldn't breathe.  No breathing difficulty with this episode.

## 2012-07-31 NOTE — ED Provider Notes (Signed)
History     CSN: [id number]  Arrival date & time 07/31/12  2154   First MD Initiated Contact with Patient 07/31/12 2308      Chief Complaint  Patient presents with  . Foreign Body    (Consider location/radiation/quality/duration/timing/severity/associated sxs/prior treatment) Patient is a 71 y.o. male presenting with foreign body. The history is provided by the patient and the spouse.  Foreign Body  The current episode started 3 to 5 hours ago. The foreign body is suspected to be swallowed. The foreign body is food. The incident was witnessed. The incident was witnessed/reported by the patient. Pertinent negatives include no chest pain, no fever, no abdominal pain, no congestion, no choking and no difficulty breathing. He has been behaving normally. His past medical history does not include esophageal disease. There were no sick contacts. He has received no recent medical care.    Past Medical History  Diagnosis Date  . Diabetes mellitus   . Hyperlipidemia   . Hypertension   . Chronic back pain     Past Surgical History  Procedure Date  . Cholecystectomy     No family history on file.  History  Substance Use Topics  . Smoking status: Not on file  . Smokeless tobacco: Not on file  . Alcohol Use:       Review of Systems  Constitutional: Negative for fever.  HENT: Negative for congestion.   Respiratory: Negative for choking.   Cardiovascular: Negative for chest pain.  Gastrointestinal: Negative for abdominal pain.  All other systems reviewed and are negative.    Allergies  Byetta 10 mcg pen  Home Medications   Current Outpatient Rx  Name Route Sig Dispense Refill  . AMLODIPINE BESYLATE 10 MG PO TABS Oral Take 10 mg by mouth daily.      [name] GLIPIZIDE 10 MG PO TABS Oral Take 10 mg by mouth 2 (two) times daily before a meal.      . LOSARTAN POTASSIUM-HCTZ 100-25 MG PO TABS Oral Take 1 tablet by mouth daily.      [name] METFORMIN HCL 1000 MG PO TABS Oral Take 1,000 mg  by mouth 2 (two) times daily with a meal.      . SIMVASTATIN 20 MG PO TABS Oral Take 20 mg by mouth at bedtime.        BP 161/72  Pulse 85  Temp 98.3 F (36.8 C)  Resp 20  SpO2 99%  Physical Exam  Constitutional: He is oriented to person, place, and time. He appears well-developed and well-nourished. No distress.  HENT:  Head: Normocephalic and atraumatic.  Mouth/Throat: Oropharynx is clear and moist.  Eyes: Conjunctivae and EOM are normal.  Neck: Normal range of motion. Neck supple.  Cardiovascular: Normal rate and regular rhythm.   Pulmonary/Chest: Effort normal and breath sounds normal. No stridor. He has no wheezes. He has no rales.  Abdominal: Soft. Bowel sounds are normal. There is no tenderness. There is no rebound and no guarding.  Musculoskeletal: Normal range of motion.  Neurological: He is alert and oriented to person, place, and time.  Skin: Skin is warm and dry.  Psychiatric: He has a normal mood and affect.    ED Course  Procedures (including critical care time)  Labs Reviewed - No data to display No results found.   No diagnosis found.    MDM  To endoscopy        Vong Garringer K Neylan Koroma-Rasch, MD 08/01/12 [phone]

## 2012-08-01 ENCOUNTER — Other Ambulatory Visit (HOSPITAL_COMMUNITY): Payer: Self-pay | Admitting: Gastroenterology

## 2012-08-01 ENCOUNTER — Encounter (HOSPITAL_COMMUNITY): Payer: Self-pay | Admitting: *Deleted

## 2012-08-01 ENCOUNTER — Encounter (HOSPITAL_COMMUNITY)
Admission: EM | Disposition: A | Discharge status: Home / Self Care | Payer: Self-pay | Source: Home / Self Care | Attending: Emergency Medicine

## 2012-08-01 ENCOUNTER — Ambulatory Visit (HOSPITAL_COMMUNITY)
Admission: RE | Admit: 2012-08-01 | Discharge: 2012-08-01 | Disposition: A | Discharge status: Home / Self Care | Payer: Medicare Other | Source: Ambulatory Visit | Attending: Gastroenterology | Admitting: Gastroenterology

## 2012-08-01 DIAGNOSIS — R1310 Dysphagia, unspecified: Secondary | ICD-10-CM | POA: Insufficient documentation

## 2012-08-01 DIAGNOSIS — R070 Pain in throat: Secondary | ICD-10-CM

## 2012-08-01 DIAGNOSIS — K224 Dyskinesia of esophagus: Secondary | ICD-10-CM | POA: Insufficient documentation

## 2012-08-01 HISTORY — PX: ESOPHAGOGASTRODUODENOSCOPY: SHX5428

## 2012-08-01 SURGERY — EGD (ESOPHAGOGASTRODUODENOSCOPY)
Anesthesia: Moderate Sedation

## 2012-08-01 MED ORDER — NITROGLYCERIN 0.4 MG SL SUBL
0.4000 mg | SUBLINGUAL_TABLET | Freq: Once | SUBLINGUAL | Status: AC
  Administered 2012-08-01: 0.4 mg via SUBLINGUAL
  Filled 2012-08-01: qty 25

## 2012-08-01 NOTE — ED Notes (Signed)
[id number] Expected date:<BR> Expected time:<BR> Means of arrival:<BR> Comments:<BR> Pt in endo

## 2012-08-01 NOTE — ED Notes (Signed)
Endoscopy RN at bedside to take pt to endoscopy

## 2012-08-01 NOTE — Progress Notes (Signed)
Pt transferred to Endo unit for EGD d/t food impaction.  Pt was not vomiting or spitting.  Inquired if pt had any pain in throat or chest.  Pt stated some soreness/pain in throat.  Paged Dr. Watt Climes.  Per Dr. Watt Climes, gave pt small amount of water to drink to see if food impaction had passed.  Pt did drink water without any problem.  Pt did not vomit or choke from drinking the water.  Dr. Watt Climes informed.  Per Dr. Watt Climes, pt ok to be d/c'ed to home.  Pt to be scheduled for barium swallow later this morning and continue a liquid diet.  Pt informed of this info and Alesia Morin., RN in ED informed also.

## 2012-08-04 ENCOUNTER — Encounter (HOSPITAL_COMMUNITY): Payer: Self-pay

## 2012-08-04 ENCOUNTER — Encounter (HOSPITAL_COMMUNITY): Payer: Self-pay | Admitting: Gastroenterology

## 2012-11-21 DIAGNOSIS — Z87891 Personal history of nicotine dependence: Secondary | ICD-10-CM | POA: Insufficient documentation

## 2013-02-20 ENCOUNTER — Telehealth: Payer: Self-pay | Admitting: Physician Assistant

## 2013-02-20 NOTE — Telephone Encounter (Signed)
Pt has not been seen since June 2012.  We will refill  No medication until seen.  Refill glipizide returned to pharmacy denied. Tried to call patient.  No valid phone numbers.

## 2013-07-10 ENCOUNTER — Other Ambulatory Visit: Payer: Self-pay | Admitting: Physician Assistant

## 2013-07-13 NOTE — Telephone Encounter (Signed)
Pt has been dismissed. Only one month refill given.

## 2013-08-12 ENCOUNTER — Other Ambulatory Visit: Payer: Self-pay | Admitting: Physician Assistant

## 2014-02-14 ENCOUNTER — Other Ambulatory Visit: Payer: Self-pay | Admitting: Physician Assistant

## 2014-02-15 NOTE — Telephone Encounter (Signed)
Pt dismissed and has not been seen in a year.  Refill denied until pt contacts office

## 2014-04-04 ENCOUNTER — Other Ambulatory Visit: Payer: Self-pay | Admitting: Physician Assistant

## 2014-04-05 NOTE — Telephone Encounter (Signed)
Pt has not been seen in over a year.  Refill denied until contact office

## 2014-09-16 ENCOUNTER — Other Ambulatory Visit: Payer: Self-pay | Admitting: *Deleted

## 2014-09-16 ENCOUNTER — Encounter: Payer: Self-pay | Admitting: Vascular Surgery

## 2014-09-16 DIAGNOSIS — Z01810 Encounter for preprocedural cardiovascular examination: Secondary | ICD-10-CM

## 2014-09-16 DIAGNOSIS — N184 Chronic kidney disease, stage 4 (severe): Secondary | ICD-10-CM

## 2014-09-28 ENCOUNTER — Emergency Department (HOSPITAL_COMMUNITY): Payer: Medicare Other

## 2014-09-28 ENCOUNTER — Inpatient Hospital Stay (HOSPITAL_COMMUNITY)
Admission: EM | Admit: 2014-09-28 | Discharge: 2014-10-02 | DRG: 281 | Disposition: A | Discharge status: Home Health | Payer: Medicare Other | Attending: Internal Medicine | Admitting: Internal Medicine

## 2014-09-28 ENCOUNTER — Encounter (HOSPITAL_COMMUNITY): Payer: Self-pay | Admitting: Emergency Medicine

## 2014-09-28 DIAGNOSIS — R4182 Altered mental status, unspecified: Secondary | ICD-10-CM

## 2014-09-28 DIAGNOSIS — I4589 Other specified conduction disorders: Secondary | ICD-10-CM | POA: Diagnosis present

## 2014-09-28 DIAGNOSIS — R7989 Other specified abnormal findings of blood chemistry: Secondary | ICD-10-CM

## 2014-09-28 DIAGNOSIS — Z7982 Long term (current) use of aspirin: Secondary | ICD-10-CM

## 2014-09-28 DIAGNOSIS — E785 Hyperlipidemia, unspecified: Secondary | ICD-10-CM | POA: Diagnosis present

## 2014-09-28 DIAGNOSIS — Z87891 Personal history of nicotine dependence: Secondary | ICD-10-CM

## 2014-09-28 DIAGNOSIS — R410 Disorientation, unspecified: Secondary | ICD-10-CM | POA: Diagnosis present

## 2014-09-28 DIAGNOSIS — I120 Hypertensive chronic kidney disease with stage 5 chronic kidney disease or end stage renal disease: Secondary | ICD-10-CM | POA: Diagnosis not present

## 2014-09-28 DIAGNOSIS — N185 Chronic kidney disease, stage 5: Secondary | ICD-10-CM | POA: Diagnosis present

## 2014-09-28 DIAGNOSIS — E119 Type 2 diabetes mellitus without complications: Secondary | ICD-10-CM | POA: Insufficient documentation

## 2014-09-28 DIAGNOSIS — N179 Acute kidney failure, unspecified: Secondary | ICD-10-CM | POA: Insufficient documentation

## 2014-09-28 DIAGNOSIS — R001 Bradycardia, unspecified: Secondary | ICD-10-CM | POA: Diagnosis present

## 2014-09-28 DIAGNOSIS — E11649 Type 2 diabetes mellitus with hypoglycemia without coma: Secondary | ICD-10-CM

## 2014-09-28 DIAGNOSIS — Z8673 Personal history of transient ischemic attack (TIA), and cerebral infarction without residual deficits: Secondary | ICD-10-CM

## 2014-09-28 DIAGNOSIS — R778 Other specified abnormalities of plasma proteins: Secondary | ICD-10-CM | POA: Diagnosis present

## 2014-09-28 DIAGNOSIS — G8929 Other chronic pain: Secondary | ICD-10-CM | POA: Diagnosis present

## 2014-09-28 DIAGNOSIS — I214 Non-ST elevation (NSTEMI) myocardial infarction: Principal | ICD-10-CM | POA: Diagnosis present

## 2014-09-28 DIAGNOSIS — Z6834 Body mass index (BMI) 34.0-34.9, adult: Secondary | ICD-10-CM

## 2014-09-28 DIAGNOSIS — R079 Chest pain, unspecified: Secondary | ICD-10-CM

## 2014-09-28 DIAGNOSIS — I4891 Unspecified atrial fibrillation: Secondary | ICD-10-CM | POA: Diagnosis present

## 2014-09-28 DIAGNOSIS — E1121 Type 2 diabetes mellitus with diabetic nephropathy: Secondary | ICD-10-CM | POA: Diagnosis present

## 2014-09-28 DIAGNOSIS — I2582 Chronic total occlusion of coronary artery: Secondary | ICD-10-CM | POA: Diagnosis present

## 2014-09-28 DIAGNOSIS — I10 Essential (primary) hypertension: Secondary | ICD-10-CM

## 2014-09-28 DIAGNOSIS — I442 Atrioventricular block, complete: Secondary | ICD-10-CM | POA: Diagnosis not present

## 2014-09-28 DIAGNOSIS — I441 Atrioventricular block, second degree: Secondary | ICD-10-CM | POA: Diagnosis present

## 2014-09-28 DIAGNOSIS — R011 Cardiac murmur, unspecified: Secondary | ICD-10-CM | POA: Diagnosis present

## 2014-09-28 DIAGNOSIS — E669 Obesity, unspecified: Secondary | ICD-10-CM | POA: Diagnosis present

## 2014-09-28 DIAGNOSIS — E11319 Type 2 diabetes mellitus with unspecified diabetic retinopathy without macular edema: Secondary | ICD-10-CM | POA: Diagnosis present

## 2014-09-28 DIAGNOSIS — E162 Hypoglycemia, unspecified: Secondary | ICD-10-CM | POA: Diagnosis not present

## 2014-09-28 DIAGNOSIS — K219 Gastro-esophageal reflux disease without esophagitis: Secondary | ICD-10-CM | POA: Diagnosis present

## 2014-09-28 DIAGNOSIS — I2510 Atherosclerotic heart disease of native coronary artery without angina pectoris: Secondary | ICD-10-CM | POA: Diagnosis present

## 2014-09-28 DIAGNOSIS — I350 Nonrheumatic aortic (valve) stenosis: Secondary | ICD-10-CM | POA: Diagnosis present

## 2014-09-28 DIAGNOSIS — I4510 Unspecified right bundle-branch block: Secondary | ICD-10-CM | POA: Diagnosis present

## 2014-09-28 DIAGNOSIS — Z79899 Other long term (current) drug therapy: Secondary | ICD-10-CM

## 2014-09-28 DIAGNOSIS — N183 Chronic kidney disease, stage 3 (moderate): Secondary | ICD-10-CM

## 2014-09-28 DIAGNOSIS — N189 Chronic kidney disease, unspecified: Secondary | ICD-10-CM

## 2014-09-28 DIAGNOSIS — M549 Dorsalgia, unspecified: Secondary | ICD-10-CM | POA: Diagnosis present

## 2014-09-28 DIAGNOSIS — E8770 Fluid overload, unspecified: Secondary | ICD-10-CM | POA: Diagnosis present

## 2014-09-28 DIAGNOSIS — I272 Other secondary pulmonary hypertension: Secondary | ICD-10-CM | POA: Diagnosis present

## 2014-09-28 HISTORY — DX: Nonrheumatic aortic (valve) stenosis: I35.0

## 2014-09-28 LAB — COMPREHENSIVE METABOLIC PANEL
ALT: 9 U/L (ref 0–53)
AST: 11 U/L (ref 0–37)
Albumin: 3.6 g/dL (ref 3.5–5.2)
Alkaline Phosphatase: 82 U/L (ref 39–117)
Anion gap: 23 — ABNORMAL HIGH (ref 5–15)
BUN: 77 mg/dL — ABNORMAL HIGH (ref 6–23)
CALCIUM: 9.4 mg/dL (ref 8.4–10.5)
CO2: 17 mEq/L — ABNORMAL LOW (ref 19–32)
Chloride: 96 mEq/L (ref 96–112)
Creatinine, Ser: 5.15 mg/dL — ABNORMAL HIGH (ref 0.50–1.35)
GFR, EST AFRICAN AMERICAN: 11 mL/min — AB (ref >90)
GFR, EST NON AFRICAN AMERICAN: 10 mL/min — AB (ref >90)
GLUCOSE: 308 mg/dL — AB (ref 70–99)
Potassium: 3.8 mEq/L (ref 3.7–5.3)
Sodium: 136 mEq/L — ABNORMAL LOW (ref 137–147)
Total Bilirubin: 0.4 mg/dL (ref 0.3–1.2)
Total Protein: 7.3 g/dL (ref 6.0–8.3)

## 2014-09-28 LAB — CBC
HCT: 34.4 % — ABNORMAL LOW (ref 39.0–52.0)
Hemoglobin: 12.6 g/dL — ABNORMAL LOW (ref 13.0–17.0)
MCH: 30.7 pg (ref 26.0–34.0)
MCHC: 36.6 g/dL — AB (ref 30.0–36.0)
MCV: 83.9 fL (ref 78.0–100.0)
Platelets: 245 10*3/uL (ref 150–400)
RBC: 4.1 MIL/uL — ABNORMAL LOW (ref 4.22–5.81)
RDW: 12.9 % (ref 11.5–15.5)
WBC: 8.2 10*3/uL (ref 4.0–10.5)

## 2014-09-28 LAB — URINE MICROSCOPIC-ADD ON

## 2014-09-28 LAB — I-STAT TROPONIN, ED: Troponin i, poc: 0.03 ng/mL (ref 0.00–0.08)

## 2014-09-28 LAB — CBG MONITORING, ED: Glucose-Capillary: 241 mg/dL — ABNORMAL HIGH (ref 70–99)

## 2014-09-28 LAB — URINALYSIS, ROUTINE W REFLEX MICROSCOPIC
Bilirubin Urine: NEGATIVE
Glucose, UA: 500 mg/dL — AB
KETONES UR: NEGATIVE mg/dL
LEUKOCYTES UA: NEGATIVE
NITRITE: NEGATIVE
Protein, ur: >300 mg/dL — AB
Specific Gravity, Urine: 1.013 (ref 1.005–1.030)
Urobilinogen, UA: 0.2 mg/dL (ref 0.0–1.0)
pH: 5.5 (ref 5.0–8.0)

## 2014-09-28 LAB — AMMONIA: AMMONIA: 24 umol/L (ref 11–60)

## 2014-09-28 MED ORDER — NITROGLYCERIN 0.4 MG SL SUBL
0.4000 mg | SUBLINGUAL_TABLET | SUBLINGUAL | Status: DC | PRN
Start: 2014-09-28 — End: 2014-10-02
  Administered 2014-09-28: 0.4 mg via SUBLINGUAL
  Filled 2014-09-28: qty 1

## 2014-09-28 MED ORDER — ASPIRIN 81 MG PO CHEW
243.0000 mg | CHEWABLE_TABLET | Freq: Once | ORAL | Status: AC
  Administered 2014-09-28: 243 mg via ORAL
  Filled 2014-09-28: qty 3

## 2014-09-28 MED ORDER — ONDANSETRON HCL 4 MG/2ML IJ SOLN
4.0000 mg | Freq: Once | INTRAMUSCULAR | Status: AC
  Administered 2014-09-28: 4 mg via INTRAVENOUS
  Filled 2014-09-28: qty 2

## 2014-09-28 MED ORDER — NITROGLYCERIN 0.4 MG SL SUBL
0.4000 mg | SUBLINGUAL_TABLET | SUBLINGUAL | Status: DC | PRN
  Administered 2014-09-28: 0.4 mg via SUBLINGUAL

## 2014-09-28 MED ORDER — ASPIRIN 325 MG PO TABS: 325.0000 mg | ORAL_TABLET | Freq: Once | ORAL | Status: DC

## 2014-09-28 NOTE — ED Notes (Signed)
MD at bedside. 

## 2014-09-28 NOTE — ED Provider Notes (Signed)
CSN: [id number]     Arrival date & time 09/28/14  2033 History   First MD Initiated Contact with Patient 09/28/14 2053     Chief Complaint  Patient presents with  . Extremity Weakness    left sided  . Chest Pain     (Consider location/radiation/quality/duration/timing/severity/associated sxs/prior Treatment) Patient is a 73 y.o. male presenting with chest pain and general illness.  Chest Pain Associated symptoms: fatigue   Associated symptoms: no cough, no fever, no nausea, no shortness of breath and not vomiting   Illness Location:  Generalized Quality:  Confusion, weakness, tremor Severity:  Moderate Onset quality:  Sudden Duration: 30 minutes. Timing:  Constant Progression:  Partially resolved Chronicity:  New Context:  Spontaneous Relieved by:  Nothing Worsened by:  Nothing Associated symptoms: chest pain, diarrhea and fatigue   Associated symptoms: no cough, no fever, no nausea, no shortness of breath and no vomiting     Past Medical History  Diagnosis Date  . Diabetes mellitus   . Hyperlipidemia   . Hypertension   . Chronic back pain   . Renal disorder    Past Surgical History  Procedure Laterality Date  . Cholecystectomy    . Esophagogastroduodenoscopy  08/01/2012    Procedure: ESOPHAGOGASTRODUODENOSCOPY (EGD);  Surgeon: [name], MD;  Location: [facility];  Service: Endoscopy;  Laterality: N/A;   Family History  Problem Relation Age of Onset  . Diabetes Father   . Diabetes Sister    History  Substance Use Topics  . Smoking status: Former Smoker -- 2.00 packs/day for 32 years    Quit date: 11/27/1983  . Smokeless tobacco: Not on file  . Alcohol Use: No     Comment: occ    Review of Systems  Constitutional: Positive for fatigue. Negative for fever.  Respiratory: Negative for cough and shortness of breath.   Cardiovascular: Positive for chest pain.  Gastrointestinal: Positive for diarrhea. Negative for nausea and vomiting.  All other systems  reviewed and are negative.     Allergies  Byetta 10 mcg pen  Home Medications   Prior to Admission medications   Medication Sig Start Date End Date Taking? Authorizing Provider  amLODipine (NORVASC) 10 MG tablet Take 10 mg by mouth daily. 08/03/14  Yes Historical Provider, MD  aspirin 81 MG chewable tablet Chew 81 mg by mouth daily.   Yes Historical Provider, MD  calcitRIOL (ROCALTROL) 0.25 MCG capsule Take 0.25 mcg by mouth daily. 09/02/14  Yes Historical Provider, MD  cloNIDine (CATAPRES) 0.1 MG tablet Take 0.1 mg by mouth 2 (two) times daily. 08/30/14  Yes Historical Provider, MD  furosemide (LASIX) 80 MG tablet Take 80 mg by mouth 2 (two) times daily. 08/30/14  Yes Historical Provider, MD  Insulin Detemir (LEVEMIR) 100 UNIT/ML Pen Inject 35 Units into the skin at bedtime. 06/11/14  Yes Historical Provider, MD  lisinopril (PRINIVIL,ZESTRIL) 40 MG tablet Take 40 mg by mouth 2 (two) times daily. 09/27/14  Yes Historical Provider, MD  nitroGLYCERIN (NITROSTAT) 0.4 MG SL tablet Take 0.4 mg by mouth as needed. 10/16/13  Yes Historical Provider, MD  simvastatin (ZOCOR) 20 MG tablet TAKE 1 TABLET BY MOUTH EVERY NIGHT AT BEDTIME 08/12/13  Yes Mary B Dixon, PA-C  amLODipine (NORVASC) 10 MG tablet Take 10 mg by mouth daily.      Historical Provider, MD  glipiZIDE (GLUCOTROL) 10 MG tablet Take 10 mg by mouth 2 (two) times daily before a meal.      Historical Provider, MD  losartan-hydrochlorothiazide (HYZAAR) 100-25 MG per tablet Take 1 tablet by mouth daily.      Historical Provider, MD  metFORMIN (GLUCOPHAGE) 1000 MG tablet Take 1,000 mg by mouth 2 (two) times daily with a meal.      Historical Provider, MD   BP 153/63 mmHg  Pulse 133  Temp(Src) 97.6 F (36.4 C) (Oral)  Resp 18  Ht 5' 11.5" (1.816 m)  Wt 250 lb (113.399 kg)  BMI 34.39 kg/m2  SpO2 95% Physical Exam  Constitutional: He is oriented to person, place, and time. He appears well-developed and well-nourished.  HENT:  Head:  Normocephalic and atraumatic.  Eyes: Conjunctivae and EOM are normal.  Neck: Normal range of motion. Neck supple.  Cardiovascular: Normal rate, regular rhythm and normal heart sounds.   Pulmonary/Chest: Effort normal and breath sounds normal. No respiratory distress.  Abdominal: He exhibits no distension. There is no tenderness. There is no rebound and no guarding.  Musculoskeletal: Normal range of motion.  Neurological: He is alert and oriented to person, place, and time. He has normal strength. No cranial nerve deficit or sensory deficit.  Skin: Skin is warm and dry.  Vitals reviewed.   ED Course  Procedures (including critical care time) Labs Review Labs Reviewed  CBC - Abnormal; Notable for the following:    RBC 4.10 (*)    Hemoglobin 12.6 (*)    HCT 34.4 (*)    MCHC 36.6 (*)    All other components within normal limits  COMPREHENSIVE METABOLIC PANEL - Abnormal; Notable for the following:    Sodium 136 (*)    CO2 17 (*)    Glucose, Bld 308 (*)    BUN 77 (*)    Creatinine, Ser 5.15 (*)    GFR calc non Af Amer 10 (*)    GFR calc Af Amer 11 (*)    Anion gap 23 (*)    All other components within normal limits  URINALYSIS, ROUTINE W REFLEX MICROSCOPIC - Abnormal; Notable for the following:    Glucose, UA 500 (*)    Hgb urine dipstick SMALL (*)    Protein, ur >300 (*)    All other components within normal limits  URINE MICROSCOPIC-ADD ON - Abnormal; Notable for the following:    Casts GRANULAR CAST (*)    All other components within normal limits  CBG MONITORING, ED - Abnormal; Notable for the following:    Glucose-Capillary 241 (*)    All other components within normal limits  AMMONIA  TROPONIN I  TROPONIN I  TROPONIN I  I-STAT TROPOININ, ED    Imaging Review Dg Chest 1 View  09/28/2014   CLINICAL DATA:  Left-sided arm and leg weakness. Memory loss. Diabetes. Mid chest pain. Code stroke.  EXAM: CHEST - 1 VIEW  COMPARISON:  08/06/2011  FINDINGS: Shallow inspiration.  Mild cardiac enlargement with mild central pulmonary vascular congestion. No edema or consolidation. Atelectasis in the left lung base. Calcified and tortuous aorta. No pneumothorax.  IMPRESSION: Cardiac enlargement with mild pulmonary vascular congestion. No edema or consolidation. Atelectasis left lung base.   Electronically Signed   By: Lucienne Capers M.D.   On: 09/28/2014 21:54   Ct Head Wo Contrast  09/28/2014   CLINICAL DATA:  73 year old male with acute onset of left-sided arm and leg weakness, and memory loss at 8 p.m. this evening.  EXAM: CT HEAD WITHOUT CONTRAST  TECHNIQUE: Contiguous axial images were obtained from the base of the skull through the vertex without intravenous  contrast.  COMPARISON:  Head CT 09/02/2009.  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 Mild cerebral atrophy. Patchy and confluent areas of decreased attenuation are noted throughout the deep and periventricular white matter of the cerebral hemispheres bilaterally, compatible with chronic microvascular ischemic disease. Physiologic calcifications in the basal ganglia bilaterally. No acute intracranial abnormalities. Specifically, no evidence of acute intracranial hemorrhage, no definite findings of acute/subacute cerebral ischemia, no mass, mass effect, hydrocephalus or abnormal intra or extra-axial fluid collections. Visualized paranasal sinuses and mastoids are well pneumatized. No acute displaced skull fractures are identified.  IMPRESSION: 1. No acute intracranial abnormalities. 2. 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   Electronically Signed   By: Vinnie Langton M.D.   On: 09/28/2014 21:56     EKG Interpretation   Date/Time:  Tuesday September 28 2014 20:44:03 EST Ventricular Rate:  85 PR Interval:    QRS Duration: 161 QT Interval:  412 QTC Calculation: 490 R Axis:    130 Text Interpretation:  Atrial fibrillation Right bundle branch block qrs  has widened since last tracing Confirmed by Debby Freiberg [id number]) on  09/28/2014 9:01:28 PM      MDM   Final diagnoses:  Altered mental status    73 y.o. male with pertinent PMH of DM, CKD, chronic back pain presents with acute confusion, chest pain, shaking 30 minutes prior to arrival.  I was called to evaluate the patient in triage due to concern for AMS and concern for code stroke.  Pt has no focal neuro deficits on exam and is not disoriented.  His wife does not endorse any lateralizing symptoms and he appears to be improving.  Initial CBG 240, ECG as above with new afib and RBBB.  Chest pain relieved with nitro.  Initial troponin negative.  CXR with volume overload, pt with some dyspnea.  Labs returned with AKI, likely acute on chronic.  Also with uremia.  This is likely etiology of symptoms.  Consulted hospitalist for stepdown admission and nephrology who state that they will likely dialyze in am.  As pt has negative initial trop and relief, will not start him on heparin.  Admitted in stable condition.  1. Hyperlipidemia   2. Altered mental status   3. Chest pain   4. Essential hypertension         Debby Freiberg, MD 09/29/14 [phone]

## 2014-09-28 NOTE — ED Notes (Signed)
Patient transported to CT/XR ?

## 2014-09-28 NOTE — ED Notes (Signed)
Patient arrives with wife. Patient c/o left sided arm and leg weakness, memory loss. Symptom onset 2000 tonight.

## 2014-09-28 NOTE — ED Notes (Signed)
Patient left arm with increased mobility.

## 2014-09-28 NOTE — ED Notes (Signed)
Dr. Gentry at bedside. 

## 2014-09-28 NOTE — H&P (Signed)
[facility] History and Physical  Jeffrey [name] [id number] DOB: [birth year] DOA: 09/28/2014  Referring physician: ED physician PCP: No primary care provider on file.  Specialists:   Chief Complaint: chest pain  HPI: Jeffrey [name] is a 73 y.o. male with past medical history for diabetes type 2, hypertension, hyperlipidemia, CAD, CKD-III,  history of stroke (without deficit), who presents with chest pain.  Patient reports that at about 8 PM, he started having chest pain. His chest pain is located in the substernal area, constant, 10 out of 10 in severity, sharp, radiating to the left arm. He also had pain over his left leg.his chest pain was not pleuritic, deep breath did not change his chest pain. He does not have cough, fever or chills. Has mild nausea, but no vomiting. Patient has very mild diarrhea in the past 3 days with average of 1 bowel movement with loose stool each day. He does not have abdominal pain. No symptoms for UTI. Per patient's wife, patient was confused for almost 2 hours after his chest pain started. He was brought to ED by his wife. His chest pain improved significantly after given nitroglycerin sublingually. When I evaluated the patient, patient was oriented 3. His chest pain is minimal.   Patient was found to have negative troponin, new onset of RBBB on EKG, elevated blood pressure at 159/119 mmHg. Elevated Cre from 1.26 on 08/06/11 to 5.15 on admission. He is admitted to inpatient for further evaluation and treatment.  Review of Systems: As presented in the history of presenting illness, rest negative.  Where does patient live?  Lives with his wife at home Can patient participate in ADLs? Yes  Allergy:  Allergies  Allergen Reactions  . Byetta 10 Mcg Pen [Exenatide] Nausea And Vomiting    Past Medical History  Diagnosis Date  . Diabetes mellitus   . Hyperlipidemia   . Hypertension   . Chronic back pain   . Renal disorder     Past Surgical History   Procedure Laterality Date  . Cholecystectomy    . Esophagogastroduodenoscopy  08/01/2012    Procedure: ESOPHAGOGASTRODUODENOSCOPY (EGD);  Surgeon: [name], MD;  Location: [facility];  Service: Endoscopy;  Laterality: N/A;    Social History:  reports that he quit smoking about 30 years ago. He does not have any smokeless tobacco history on file. He reports that he does not drink alcohol or use illicit drugs.  Family History:  Family History  Problem Relation Age of Onset  . Diabetes Father   . Diabetes Sister      Prior to Admission medications   Medication Sig Start Date End Date Taking? Authorizing Provider  amLODipine (NORVASC) 10 MG tablet Take 10 mg by mouth daily. 08/03/14  Yes Historical Provider, MD  aspirin 81 MG chewable tablet Chew 81 mg by mouth daily.   Yes Historical Provider, MD  calcitRIOL (ROCALTROL) 0.25 MCG capsule Take 0.25 mcg by mouth daily. 09/02/14  Yes Historical Provider, MD  cloNIDine (CATAPRES) 0.1 MG tablet Take 0.1 mg by mouth 2 (two) times daily. 08/30/14  Yes Historical Provider, MD  furosemide (LASIX) 80 MG tablet Take 80 mg by mouth 2 (two) times daily. 08/30/14  Yes Historical Provider, MD  Insulin Detemir (LEVEMIR) 100 UNIT/ML Pen Inject 35 Units into the skin at bedtime. 06/11/14  Yes Historical Provider, MD  lisinopril (PRINIVIL,ZESTRIL) 40 MG tablet Take 40 mg by mouth 2 (two) times daily. 09/27/14  Yes Historical Provider, MD  nitroGLYCERIN (NITROSTAT) 0.4  MG SL tablet Take 0.4 mg by mouth as needed. 10/16/13  Yes Historical Provider, MD  simvastatin (ZOCOR) 20 MG tablet TAKE 1 TABLET BY MOUTH EVERY NIGHT AT BEDTIME 08/12/13  Yes Mary B Dixon, PA-C  amLODipine (NORVASC) 10 MG tablet Take 10 mg by mouth daily.      Historical Provider, MD  glipiZIDE (GLUCOTROL) 10 MG tablet Take 10 mg by mouth 2 (two) times daily before a meal.      Historical Provider, MD  losartan-hydrochlorothiazide (HYZAAR) 100-25 MG per tablet Take 1 tablet by mouth daily.       Historical Provider, MD  metFORMIN (GLUCOPHAGE) 1000 MG tablet Take 1,000 mg by mouth 2 (two) times daily with a meal.      Historical Provider, MD    Physical Exam: Filed Vitals:   09/28/14 2200 09/28/14 2246 09/28/14 2330 09/29/14 0030  BP: 149/74 153/63 159/115 168/71  Pulse: 64 133 79 139  Temp:  97.6 F (36.4 C)    TempSrc:  Oral    Resp: 16 18 19 17   Height:      Weight:      SpO2: 94% 95% 96% 96%   General: Not in acute distress HEENT:       Eyes: PERRL, EOMI, no scleral icterus       ENT: No discharge from the ears and nose, no pharynx injection, no tonsillar enlargement.        Neck: No JVD, no bruit, no mass felt. Cardiac: [id number], RRR, 2/6 systolic murmurs, no gallops or rubs Pulm: Good air movement bilaterally. Clear to auscultation bilaterally. No rales, wheezing, rhonchi or rubs. Abd: Soft, nondistended, nontender, no rebound pain, no organomegaly, BS present Ext: No edema. 2+DP/PT pulse bilaterally Musculoskeletal: No joint deformities, erythema, or stiffness, ROM full Skin: No rashes.  Neuro: Alert and oriented X3, cranial nerves II-XII grossly intact, muscle strength 5/5 in all extremeties, sensation to light touch intact. Brachial reflex 2+ bilaterally. Knee reflex 1+ bilaterally. Negative Babinski's sign. Normal finger to nose test. Psych: Patient is not psychotic, no suicidal or hemocidal ideation.  Labs on Admission:  Basic Metabolic Panel:  Recent Labs Lab 09/28/14 2115  NA 136*  K 3.8  CL 96  CO2 17*  GLUCOSE 308*  BUN 77*  CREATININE 5.15*  CALCIUM 9.4   Liver Function Tests:  Recent Labs Lab 09/28/14 2115  AST 11  ALT 9  ALKPHOS 82  BILITOT 0.4  PROT 7.3  ALBUMIN 3.6   No results for input(s): LIPASE, AMYLASE in the last 168 hours.  Recent Labs Lab 09/28/14 2115  AMMONIA 24   CBC:  Recent Labs Lab 09/28/14 2115  WBC 8.2  HGB 12.6*  HCT 34.4*  MCV 83.9  PLT 245   Cardiac Enzymes: No results for input(s): CKTOTAL, CKMB,  CKMBINDEX, TROPONINI in the last 168 hours.  BNP (last 3 results) No results for input(s): PROBNP in the last 8760 hours. CBG:  Recent Labs Lab 09/28/14 2039  GLUCAP 241*    Radiological Exams on Admission: Dg Chest 1 View  09/28/2014   CLINICAL DATA:  Left-sided arm and leg weakness. Memory loss. Diabetes. Mid chest pain. Code stroke.  EXAM: CHEST - 1 VIEW  COMPARISON:  08/06/2011  FINDINGS: Shallow inspiration. Mild cardiac enlargement with mild central pulmonary vascular congestion. No edema or consolidation. Atelectasis in the left lung base. Calcified and tortuous aorta. No pneumothorax.  IMPRESSION: Cardiac enlargement with mild pulmonary vascular congestion. No edema or consolidation. Atelectasis left lung base.  Electronically Signed   By: Lucienne Capers M.D.   On: 09/28/2014 21:54   Ct Head Wo Contrast  09/28/2014   CLINICAL DATA:  73 year old male with acute onset of left-sided arm and leg weakness, and memory loss at 8 p.m. this evening.  EXAM: CT HEAD WITHOUT CONTRAST  TECHNIQUE: Contiguous axial images were obtained from the base of the skull through the vertex without intravenous contrast.  COMPARISON:  Head CT 09/02/2009.  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 Mild cerebral atrophy. Patchy and confluent areas of decreased attenuation are noted throughout the deep and periventricular white matter of the cerebral hemispheres bilaterally, compatible with chronic microvascular ischemic disease. Physiologic calcifications in the basal ganglia bilaterally. No acute intracranial abnormalities. Specifically, no evidence of acute intracranial hemorrhage, no definite findings of acute/subacute cerebral ischemia, no mass, mass effect, hydrocephalus or abnormal intra or extra-axial fluid collections. Visualized paranasal sinuses and mastoids are well pneumatized. No acute displaced skull fractures are identified.  IMPRESSION: 1. No acute intracranial abnormalities.  2. 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   Electronically Signed   By: Vinnie Langton M.D.   On: 09/28/2014 21:56    EKG: Independently reviewed.   Assessment/Plan Principal Problem:   Chest pain Active Problems:   Hypertension   History of stroke   DM (diabetes mellitus), type 2   HLD (hyperlipidemia)   CAD (coronary artery disease)   CKD (chronic kidney disease), stage III  Chest pain: patient has significant risk factors for coronary artery disease, including diabetes, hypertension, hyperlipidemia, chronic kidney disease. It is important to rule out ACS. He responded to nitroglycerin quickly. Currently patient's chest pain is very minimal. Another potential differential diagnosis is pulmonary embolism, which is less likely given low Well's score probability and fact that he responded to nitroglycerin with almost complete relief of chest pain.  -will admit to tele bed - cycle CE q6 x3 and repeat her EKG in the am  - Nitroglycerin, Morphine, and aspirin - switch home zocor to high dose of lipitor 80 mg daily - start metoprolol 12.5 mg bid - check FLP and A1C  - will get cardiology consult if test positive for CEs  - 2d echo  HTN: patient is lisinopril, Hyzaar, clonidine, amlodipine Lasix at home - will hold lasix and lisinopril, Hyzaar, Lasix in the setting of acute on CKD - continue home clonidine and amlodipine - start hydralazine 50 mg every 6 hours - Started metoprolol as above  Acute on CKD-III ?: Cre is up from 1.26 on 08/06/11 to 5.15 on admission. Not very sure this is acutely worsening or deteriorated renal fx over the past several years. ED consulted renal. - will check FeNa and renal US - follow up renal rec - hold lisinopril, Hyzaar and Lasix  DM-II: last A1c was 7.9 on 01/14/09. On Levemir  and glipizide, metformin at home  - hold oral  medications - Decrease Levemir dose from 35 units to 20 units while patient is nothing by mouth - SSI - check A1c  Hx of stroke: initially patient's son reported that the patient could have left leg numbness, but when I talked to patient's wife and confirmed that the patient just had left leg  pain without weakness or numbness. Therefore will not pursue stroke workup at this moment. -continue ASA and statin  DVT ppx: SQ Heparin   Code Status: Full  code Family Communication:  Yes, patient's  family     at bed side Disposition Plan: Admit to inpatient   Date of Service 09/29/2014    Ivor Costa [facility] Pager [phone]  If 7PM-7AM, please contact night-coverage [email or web address] Password [email or web address] 09/29/2014, 12:43 AM

## 2014-09-28 NOTE — ED Notes (Signed)
Patient was given 1 NTG at home prior to arrival. Patient is able to state where he is now, is unable to recall events earlier today or in the past couple weeks.

## 2014-09-28 NOTE — ED Notes (Signed)
Patient transported to CT 

## 2014-09-28 NOTE — ED Notes (Signed)
Dr Colin Rhein updated

## 2014-09-29 ENCOUNTER — Inpatient Hospital Stay (HOSPITAL_COMMUNITY): Payer: Medicare Other

## 2014-09-29 ENCOUNTER — Other Ambulatory Visit: Payer: Self-pay

## 2014-09-29 ENCOUNTER — Encounter (HOSPITAL_COMMUNITY): Payer: Self-pay | Admitting: Internal Medicine

## 2014-09-29 DIAGNOSIS — E669 Obesity, unspecified: Secondary | ICD-10-CM | POA: Diagnosis present

## 2014-09-29 DIAGNOSIS — N179 Acute kidney failure, unspecified: Secondary | ICD-10-CM

## 2014-09-29 DIAGNOSIS — I442 Atrioventricular block, complete: Secondary | ICD-10-CM | POA: Diagnosis present

## 2014-09-29 DIAGNOSIS — E1122 Type 2 diabetes mellitus with diabetic chronic kidney disease: Secondary | ICD-10-CM

## 2014-09-29 DIAGNOSIS — N189 Chronic kidney disease, unspecified: Secondary | ICD-10-CM

## 2014-09-29 DIAGNOSIS — E785 Hyperlipidemia, unspecified: Secondary | ICD-10-CM

## 2014-09-29 DIAGNOSIS — Z79899 Other long term (current) drug therapy: Secondary | ICD-10-CM | POA: Diagnosis not present

## 2014-09-29 DIAGNOSIS — I441 Atrioventricular block, second degree: Secondary | ICD-10-CM | POA: Diagnosis present

## 2014-09-29 DIAGNOSIS — I350 Nonrheumatic aortic (valve) stenosis: Secondary | ICD-10-CM | POA: Diagnosis present

## 2014-09-29 DIAGNOSIS — I214 Non-ST elevation (NSTEMI) myocardial infarction: Secondary | ICD-10-CM | POA: Diagnosis present

## 2014-09-29 DIAGNOSIS — I120 Hypertensive chronic kidney disease with stage 5 chronic kidney disease or end stage renal disease: Secondary | ICD-10-CM | POA: Diagnosis present

## 2014-09-29 DIAGNOSIS — Z6834 Body mass index (BMI) 34.0-34.9, adult: Secondary | ICD-10-CM | POA: Diagnosis not present

## 2014-09-29 DIAGNOSIS — I2582 Chronic total occlusion of coronary artery: Secondary | ICD-10-CM | POA: Diagnosis present

## 2014-09-29 DIAGNOSIS — E8770 Fluid overload, unspecified: Secondary | ICD-10-CM | POA: Diagnosis present

## 2014-09-29 DIAGNOSIS — R079 Chest pain, unspecified: Secondary | ICD-10-CM

## 2014-09-29 DIAGNOSIS — I359 Nonrheumatic aortic valve disorder, unspecified: Secondary | ICD-10-CM

## 2014-09-29 DIAGNOSIS — I10 Essential (primary) hypertension: Secondary | ICD-10-CM | POA: Diagnosis present

## 2014-09-29 DIAGNOSIS — E162 Hypoglycemia, unspecified: Secondary | ICD-10-CM | POA: Diagnosis not present

## 2014-09-29 DIAGNOSIS — R011 Cardiac murmur, unspecified: Secondary | ICD-10-CM | POA: Diagnosis present

## 2014-09-29 DIAGNOSIS — E1121 Type 2 diabetes mellitus with diabetic nephropathy: Secondary | ICD-10-CM | POA: Diagnosis present

## 2014-09-29 DIAGNOSIS — I4510 Unspecified right bundle-branch block: Secondary | ICD-10-CM | POA: Diagnosis present

## 2014-09-29 DIAGNOSIS — I4589 Other specified conduction disorders: Secondary | ICD-10-CM | POA: Diagnosis present

## 2014-09-29 DIAGNOSIS — G8929 Other chronic pain: Secondary | ICD-10-CM | POA: Diagnosis present

## 2014-09-29 DIAGNOSIS — Z8673 Personal history of transient ischemic attack (TIA), and cerebral infarction without residual deficits: Secondary | ICD-10-CM | POA: Diagnosis not present

## 2014-09-29 DIAGNOSIS — I4891 Unspecified atrial fibrillation: Secondary | ICD-10-CM | POA: Diagnosis present

## 2014-09-29 DIAGNOSIS — E11649 Type 2 diabetes mellitus with hypoglycemia without coma: Secondary | ICD-10-CM

## 2014-09-29 DIAGNOSIS — Z7982 Long term (current) use of aspirin: Secondary | ICD-10-CM | POA: Diagnosis not present

## 2014-09-29 DIAGNOSIS — I272 Other secondary pulmonary hypertension: Secondary | ICD-10-CM | POA: Diagnosis present

## 2014-09-29 DIAGNOSIS — K219 Gastro-esophageal reflux disease without esophagitis: Secondary | ICD-10-CM | POA: Diagnosis present

## 2014-09-29 DIAGNOSIS — I2510 Atherosclerotic heart disease of native coronary artery without angina pectoris: Secondary | ICD-10-CM | POA: Diagnosis present

## 2014-09-29 DIAGNOSIS — E11319 Type 2 diabetes mellitus with unspecified diabetic retinopathy without macular edema: Secondary | ICD-10-CM | POA: Diagnosis present

## 2014-09-29 DIAGNOSIS — M549 Dorsalgia, unspecified: Secondary | ICD-10-CM | POA: Diagnosis present

## 2014-09-29 DIAGNOSIS — I209 Angina pectoris, unspecified: Secondary | ICD-10-CM

## 2014-09-29 DIAGNOSIS — Z87891 Personal history of nicotine dependence: Secondary | ICD-10-CM | POA: Diagnosis not present

## 2014-09-29 DIAGNOSIS — R410 Disorientation, unspecified: Secondary | ICD-10-CM | POA: Diagnosis present

## 2014-09-29 DIAGNOSIS — N185 Chronic kidney disease, stage 5: Secondary | ICD-10-CM | POA: Diagnosis present

## 2014-09-29 DIAGNOSIS — R001 Bradycardia, unspecified: Secondary | ICD-10-CM | POA: Diagnosis present

## 2014-09-29 LAB — COMPREHENSIVE METABOLIC PANEL
ALT: 6 U/L (ref 0–53)
ANION GAP: 16 — AB (ref 5–15)
AST: 8 U/L (ref 0–37)
Albumin: 2.8 g/dL — ABNORMAL LOW (ref 3.5–5.2)
Alkaline Phosphatase: 66 U/L (ref 39–117)
BILIRUBIN TOTAL: 0.3 mg/dL (ref 0.3–1.2)
BUN: 74 mg/dL — ABNORMAL HIGH (ref 6–23)
CHLORIDE: 102 meq/L (ref 96–112)
CO2: 20 meq/L (ref 19–32)
CREATININE: 5.18 mg/dL — AB (ref 0.50–1.35)
Calcium: 8.7 mg/dL (ref 8.4–10.5)
GFR calc Af Amer: 11 mL/min — ABNORMAL LOW (ref >90)
GFR, EST NON AFRICAN AMERICAN: 10 mL/min — AB (ref >90)
Glucose, Bld: 193 mg/dL — ABNORMAL HIGH (ref 70–99)
Potassium: 4 mEq/L (ref 3.7–5.3)
Sodium: 138 mEq/L (ref 137–147)
Total Protein: 6 g/dL (ref 6.0–8.3)

## 2014-09-29 LAB — GLUCOSE, CAPILLARY
GLUCOSE-CAPILLARY: 125 mg/dL — AB (ref 70–99)
GLUCOSE-CAPILLARY: 156 mg/dL — AB (ref 70–99)
GLUCOSE-CAPILLARY: 255 mg/dL — AB (ref 70–99)
Glucose-Capillary: 178 mg/dL — ABNORMAL HIGH (ref 70–99)

## 2014-09-29 LAB — CBC
HCT: 29.5 % — ABNORMAL LOW (ref 39.0–52.0)
Hemoglobin: 10.8 g/dL — ABNORMAL LOW (ref 13.0–17.0)
MCH: 30.7 pg (ref 26.0–34.0)
MCHC: 36.6 g/dL — ABNORMAL HIGH (ref 30.0–36.0)
MCV: 83.8 fL (ref 78.0–100.0)
PLATELETS: 223 10*3/uL (ref 150–400)
RBC: 3.52 MIL/uL — ABNORMAL LOW (ref 4.22–5.81)
RDW: 12.9 % (ref 11.5–15.5)
WBC: 6.8 10*3/uL (ref 4.0–10.5)

## 2014-09-29 LAB — CBG MONITORING, ED: Glucose-Capillary: 263 mg/dL — ABNORMAL HIGH (ref 70–99)

## 2014-09-29 LAB — LIPID PANEL
Cholesterol: 120 mg/dL (ref 0–200)
HDL: 32 mg/dL — ABNORMAL LOW (ref >39)
LDL Cholesterol: 56 mg/dL (ref 0–99)
TRIGLYCERIDES: 162 mg/dL — AB (ref <150)
Total CHOL/HDL Ratio: 3.8 RATIO
VLDL: 32 mg/dL (ref 0–40)

## 2014-09-29 LAB — RAPID URINE DRUG SCREEN, HOSP PERFORMED
AMPHETAMINES: NOT DETECTED
BENZODIAZEPINES: NOT DETECTED
Barbiturates: NOT DETECTED
COCAINE: NOT DETECTED
Opiates: NOT DETECTED
Tetrahydrocannabinol: NOT DETECTED

## 2014-09-29 LAB — TROPONIN I
TROPONIN I: 0.74 ng/mL — AB (ref <0.30)
Troponin I: <0.3 ng/mL (ref <0.30)

## 2014-09-29 LAB — CREATININE, URINE, RANDOM: Creatinine, Urine: 44 mg/dL

## 2014-09-29 LAB — PROTIME-INR
INR: 1.12 (ref 0.00–1.49)
PROTHROMBIN TIME: 14.6 s (ref 11.6–15.2)

## 2014-09-29 LAB — HEMOGLOBIN A1C
HEMOGLOBIN A1C: 6.8 % — AB (ref <5.7)
Mean Plasma Glucose: 148 mg/dL — ABNORMAL HIGH (ref <117)

## 2014-09-29 LAB — MRSA PCR SCREENING: MRSA BY PCR: NEGATIVE

## 2014-09-29 LAB — SODIUM, URINE, RANDOM: Sodium, Ur: 39 mEq/L

## 2014-09-29 MED ORDER — MORPHINE SULFATE 2 MG/ML IJ SOLN: 2.0000 mg | INTRAMUSCULAR | Status: DC | PRN

## 2014-09-29 MED ORDER — NITROGLYCERIN 0.2 MG/HR TD PT24
0.2000 mg | MEDICATED_PATCH | Freq: Every day | TRANSDERMAL | Status: DC
  Administered 2014-09-29 – 2014-09-30 (×2): 0.2 mg via TRANSDERMAL
  Filled 2014-09-29 (×3): qty 1

## 2014-09-29 MED ORDER — ACETAMINOPHEN 325 MG PO TABS: 650.0000 mg | ORAL_TABLET | Freq: Four times a day (QID) | ORAL | Status: DC | PRN

## 2014-09-29 MED ORDER — METOPROLOL TARTRATE 12.5 MG HALF TABLET
12.5000 mg | ORAL_TABLET | Freq: Two times a day (BID) | ORAL | Status: DC
  Administered 2014-09-29 (×2): 12.5 mg via ORAL
  Filled 2014-09-29 (×3): qty 1

## 2014-09-29 MED ORDER — GI COCKTAIL ~~LOC~~
30.0000 mL | Freq: Once | ORAL | Status: AC
  Administered 2014-09-29: 30 mL via ORAL
  Filled 2014-09-29: qty 30

## 2014-09-29 MED ORDER — AMLODIPINE BESYLATE 10 MG PO TABS
10.0000 mg | ORAL_TABLET | Freq: Every day | ORAL | Status: DC
  Administered 2014-09-29 – 2014-10-02 (×4): 10 mg via ORAL
  Filled 2014-09-29 (×4): qty 1

## 2014-09-29 MED ORDER — SODIUM CHLORIDE 0.9 % IJ SOLN: 3.0000 mL | INTRAMUSCULAR | Status: DC | PRN

## 2014-09-29 MED ORDER — ACETAMINOPHEN 650 MG RE SUPP: 650.0000 mg | Freq: Four times a day (QID) | RECTAL | Status: DC | PRN

## 2014-09-29 MED ORDER — CALCITRIOL 0.25 MCG PO CAPS
0.2500 ug | ORAL_CAPSULE | Freq: Every day | ORAL | Status: DC
  Administered 2014-09-29 – 2014-10-02 (×4): 0.25 ug via ORAL
  Filled 2014-09-29 (×4): qty 1

## 2014-09-29 MED ORDER — INSULIN DETEMIR 100 UNIT/ML ~~LOC~~ SOLN
20.0000 [IU] | Freq: Every day | SUBCUTANEOUS | Status: DC
  Administered 2014-09-29 – 2014-10-01 (×4): 20 [IU] via SUBCUTANEOUS
  Filled 2014-09-29 (×6): qty 0.2

## 2014-09-29 MED ORDER — SODIUM CHLORIDE 0.9 % IJ SOLN
3.0000 mL | Freq: Two times a day (BID) | INTRAMUSCULAR | Status: DC
  Administered 2014-09-29 – 2014-10-01 (×5): 3 mL via INTRAVENOUS

## 2014-09-29 MED ORDER — HEPARIN SODIUM (PORCINE) 5000 UNIT/ML IJ SOLN
5000.0000 [IU] | Freq: Three times a day (TID) | INTRAMUSCULAR | Status: DC
  Administered 2014-09-29 (×3): 5000 [IU] via SUBCUTANEOUS
  Filled 2014-09-29 (×10): qty 1

## 2014-09-29 MED ORDER — ONDANSETRON HCL 4 MG/2ML IJ SOLN: 4.0000 mg | Freq: Three times a day (TID) | INTRAMUSCULAR | Status: DC | PRN

## 2014-09-29 MED ORDER — CLONIDINE HCL 0.1 MG PO TABS
0.1000 mg | ORAL_TABLET | Freq: Two times a day (BID) | ORAL | Status: DC
  Administered 2014-09-29 (×2): 0.1 mg via ORAL
  Filled 2014-09-29 (×3): qty 1

## 2014-09-29 MED ORDER — ATORVASTATIN CALCIUM 80 MG PO TABS
80.0000 mg | ORAL_TABLET | Freq: Every day | ORAL | Status: DC
  Administered 2014-09-29 – 2014-10-01 (×4): 80 mg via ORAL
  Filled 2014-09-29 (×6): qty 1

## 2014-09-29 MED ORDER — SODIUM CHLORIDE 0.9 % IV SOLN: 250.0000 mL | INTRAVENOUS | Status: DC | PRN

## 2014-09-29 MED ORDER — HYDRALAZINE HCL 20 MG/ML IJ SOLN: 10.0000 mg | INTRAMUSCULAR | Status: DC | PRN

## 2014-09-29 MED ORDER — HYDRALAZINE HCL 50 MG PO TABS
50.0000 mg | ORAL_TABLET | Freq: Four times a day (QID) | ORAL | Status: DC
  Administered 2014-09-29 – 2014-10-02 (×15): 50 mg via ORAL
  Filled 2014-09-29 (×20): qty 1

## 2014-09-29 MED ORDER — ASPIRIN 81 MG PO CHEW
81.0000 mg | CHEWABLE_TABLET | Freq: Every day | ORAL | Status: DC
  Administered 2014-09-29 – 2014-09-30 (×2): 81 mg via ORAL
  Filled 2014-09-29 (×2): qty 1

## 2014-09-29 MED ORDER — INSULIN ASPART 100 UNIT/ML ~~LOC~~ SOLN
0.0000 [IU] | Freq: Three times a day (TID) | SUBCUTANEOUS | Status: DC
  Administered 2014-09-29 (×2): 2 [IU] via SUBCUTANEOUS
  Administered 2014-09-29 – 2014-09-30 (×2): 1 [IU] via SUBCUTANEOUS
  Administered 2014-09-30: 2 [IU] via SUBCUTANEOUS
  Administered 2014-09-30 – 2014-10-01 (×2): 1 [IU] via SUBCUTANEOUS
  Administered 2014-10-01 – 2014-10-02 (×3): 2 [IU] via SUBCUTANEOUS
  Administered 2014-10-02: 1 [IU] via SUBCUTANEOUS

## 2014-09-29 NOTE — Consult Note (Addendum)
Renal Service Consult Note [place] 09/29/2014 [address] Requesting Physician: Dr Grandville Silos  Reason for Consult:  Renal failure HPI: The patient is a 73 y.o. year-old with hx of DM2, HTN presented to ED last night with left sided arm and leg weakness and memory loss. Brought in by EMS.  Also had chest pain at rec'd one SL NTG prior to arriving. On ED eval there was no lateralizing sign no code stroke was not called. EKG showed new afib with RVR; CP relieved with NTG, trop negative, CXR w vol overload and creat up at 5.0.  Pt admitted. ACEi and ARB and lasix were held, clon and amlodipine continued and started MTP and hydralazine. Last creat here was 1.26 in 2012.  UA prot > 300, 5.5, 1.013, clear, neg nit/LE, 0-2 rbc/wbc, +gran cast. CXR vasc congestion w/o edema.   PMH includes DM2, HTN, diab retinopathy.  He sees Dr Mercy Moore at [facility] and has a function of "13%" according to pts wife, they have an appt in a week for vein mapping to start access planning. On October 12th this year the creat was 4.7 at the office.  He denies nausea, vomiting dysgeusia, SOB.   ROS  no jt pain  no rash   Chart review: 03/2013 - chest pain, ruled out, Cardiolite negative. Hx ataxia d/t TIA, get OP MRI/MRA. DM 2, HTN, medical noncompliance  Past Medical History  Past Medical History  Diagnosis Date  . Diabetes mellitus   . Hyperlipidemia   . Hypertension   . Chronic back pain   . Renal disorder    Past Surgical History  Past Surgical History  Procedure Laterality Date  . Cholecystectomy    . Esophagogastroduodenoscopy  08/01/2012    Procedure: ESOPHAGOGASTRODUODENOSCOPY (EGD);  Surgeon: [name], MD;  Location: [facility];  Service: Endoscopy;  Laterality: N/A;   Family History  Family History  Problem Relation Age of Onset  . Diabetes Father   . Diabetes Sister    Social History  reports that he quit smoking about 30 years ago. He does not have any  smokeless tobacco history on file. He reports that he does not drink alcohol or use illicit drugs. Allergies  Allergies  Allergen Reactions  . Byetta 10 Mcg Pen [Exenatide] Nausea And Vomiting   Home medications Prior to Admission medications   Medication Sig Start Date End Date Taking? Authorizing Provider  amLODipine (NORVASC) 10 MG tablet Take 10 mg by mouth daily. 08/03/14  Yes Historical Provider, MD  aspirin 81 MG chewable tablet Chew 81 mg by mouth daily.   Yes Historical Provider, MD  calcitRIOL (ROCALTROL) 0.25 MCG capsule Take 0.25 mcg by mouth daily. 09/02/14  Yes Historical Provider, MD  cloNIDine (CATAPRES) 0.1 MG tablet Take 0.1 mg by mouth 2 (two) times daily. 08/30/14  Yes Historical Provider, MD  furosemide (LASIX) 80 MG tablet Take 80 mg by mouth 2 (two) times daily. 08/30/14  Yes Historical Provider, MD  Insulin Detemir (LEVEMIR) 100 UNIT/ML Pen Inject 35 Units into the skin at bedtime. 06/11/14  Yes Historical Provider, MD  lisinopril (PRINIVIL,ZESTRIL) 40 MG tablet Take 40 mg by mouth 2 (two) times daily. 09/27/14  Yes Historical Provider, MD  nitroGLYCERIN (NITROSTAT) 0.4 MG SL tablet Take 0.4 mg by mouth as needed. 10/16/13  Yes Historical Provider, MD  simvastatin (ZOCOR) 20 MG tablet TAKE 1 TABLET BY MOUTH EVERY NIGHT AT BEDTIME 08/12/13  Yes Mary B Dixon, PA-C  amLODipine ([place])  10 MG tablet Take 10 mg by mouth daily.      Historical Provider, MD  glipiZIDE (GLUCOTROL) 10 MG tablet Take 10 mg by mouth 2 (two) times daily before a meal.      Historical Provider, MD  losartan-hydrochlorothiazide (HYZAAR) 100-25 MG per tablet Take 1 tablet by mouth daily.      Historical Provider, MD  metFORMIN (GLUCOPHAGE) 1000 MG tablet Take 1,000 mg by mouth 2 (two) times daily with a meal.      Historical Provider, MD   Liver Function Tests  Recent Labs Lab 09/28/14 2115 09/29/14 0650  AST 11 8  ALT 9 6  ALKPHOS 82 66  BILITOT 0.4 0.3  PROT 7.3 6.0  ALBUMIN 3.6 2.8*   No  results for input(s): LIPASE, AMYLASE in the last 168 hours. CBC  Recent Labs Lab 09/28/14 2115 09/29/14 0650  WBC 8.2 6.8  HGB 12.6* 10.8*  HCT 34.4* 29.5*  MCV 83.9 83.8  PLT 245 [id number]   Basic Metabolic Panel  Recent Labs Lab 09/28/14 2115 09/29/14 0650  NA 136* 138  K 3.8 4.0  CL 96 102  CO2 17* 20  GLUCOSE 308* 193*  BUN 77* 74*  CREATININE 5.15* 5.18*  CALCIUM 9.4 8.7    Filed Vitals:   09/28/14 2330 09/29/14 0030 09/29/14 0150 09/29/14 0525  BP: 159/115 168/71 179/85 141/78  Pulse: 79 139 75 84  Temp:   98.2 F (36.8 C) 98.2 F (36.8 C)  TempSrc:   Oral Oral  Resp: 19 17 18 18   Height:   5\' 11"  (1.803 m)   Weight:   109.408 kg (241 lb 3.2 oz)   SpO2: 96% 96% 97% 96%   Exam Older adult male No rash, cyanosis or gangrene Sclera anicteric, throat clear Chest clear bilat RRR 3/6 blowing syst murmur Abd soft, NTND No LE or UE edema Neuro is Ox 3 and nonfocal   Creat Oct 12th 2015 was 4.7 UA prot > 300, 5.5, 1.013, clear, neg nit/LE, 0-2 rbc/wbc, +gran cast CXR vasc congestion w/o edema.   Assessment: 1 CKD stage V - pt is followed by [facility] and is actively undergoing the process of getting an access in and getting ready for dialysis. He is not grossly uremic and does not require RRT at this time. Renal function is at baseline, no new suggestions.  2 Bradycardia A-V dissociation - per primary svc 3 DM longstanding w retinopathy 4 HTN on norvasc, acei, ARB/HCT, lasix, clonidine. Vol status is good.  5 Hx of CVA 6 Chest pain - echo pending   Plan- will sign off, please call as needed.   Kelly Splinter MD (pgr) [phone]    [phone] 09/29/2014, 12:11 PM

## 2014-09-29 NOTE — Progress Notes (Signed)
Echocardiogram 2D Echocardiogram has been performed.  Jeffrey [name] 09/29/2014, 4:16 PM

## 2014-09-29 NOTE — Progress Notes (Signed)
PT Cancellation Note  Patient Details Name: Jeffrey [name] MRN: [id number] DOB: 06-04-1941   Cancelled Treatment:    Reason Eval/Treat Not Completed: Other (comment) Pt having EKG now.  Also on strict bed rest order.   Please update activity level for PT evaluation.  Thanks!   Chele Cornell,KATHrine E 09/29/2014, 10:48 AM Carmelia Bake, PT, DPT 09/29/2014 Pager: [phone]

## 2014-09-29 NOTE — Progress Notes (Signed)
[facility] PROGRESS NOTE  Jeffrey [name] [id number] DOB: [birth year] DOA: 09/28/2014 PCP: No primary care provider on file.  Assessment/Plan: #1 chest pain Patient had presented with chest pain which was relieved by nitroglycerin. Patient with multiple cardiac risk factors of diabetes, hypertension,hyperlipidemia, history of stroke, chronic kidney disease. Patient also noted to have a murmur on exam. Patient denies any current chest pain.EKG revealing sinus bradycardia with transient rates in the 40s and possible A-V dissociation.cardiac enzymes negative 2. 2-D echo is pending. Patient has been seen by cardiology who recommended transfer to [facility] stepdown unit for further evaluation of patient's possible A-V dissociation and further cardiac workup. Patient currently with acute on chronic kidney disease with a creatinine of 5 and a such likely unable to undergo a cardiac catheterization. Will discontinue clonidine and metoprolol.continue aspirin, Lipitor, hydralazine. Cardiology following and appreciate input and recommendations.  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 EKG and per telemetry. Cardiac enzymes have been negative 2. 2-D echo is pending. Discontinue metoprolol and clonidine. Pacer pads to bedside. Patient has been seen by cardiology and recommended transfer to [facility] for further evaluation and possible EP evaluation. Follow.  #3 acute on chronic kidney disease stage III ??etiology. Patient's creatinine currently of 5.18 last creatinine in Epic was 1.26 in September 2012. Patient was on a ACE inhibitor, ARB, diuretic on admission. ACE inhibitor ARB and diuretic have been discontinued. FENa pending. Renal ultrasound pending. Place a Foley catheter. Consult with nephrology for further evaluation and management.  #4 hypertension Continue Norvasc, hydralazine. Diuretic, ARB, ACE inhibitor held secondary to acute on chronic kidney disease.  Metoprolol and clonidine have been discontinued secondary to A-V dissociation.  #5 diabetes mellitus Check a hemoglobin A1c.CBGs have ranged from 125-263. Continue Levemir. Continue sliding scale insulin.  #6 history of CVA CT head negative. Continue aspirin for secondary stroke prevention.  #7 prophylaxis PPI for GI prophylaxis. Heparin for DVT prophylaxis.   Code Status: full Family Communication: updated patient and family at bedside. Disposition Plan: transfer to [facility] stepdown unit.   Consultants:  Cardiology: Dr. Claiborne Billings 09/29/2014  Procedures:  Chest x-ray 09/28/2014  CT head 09/28/2014  Antibiotics:  none  HPI/Subjective: Patient denies any chest pain. No shortness of breath. Patient states that I want to go home.  Objective: Filed Vitals:   09/29/14 0525  BP: 141/78  Pulse: 84  Temp: 98.2 F (36.8 C)  Resp: 18    Intake/Output Summary (Last 24 hours) at 09/29/14 1129 Last data filed at 09/29/14 [id number]  Gross per 24 hour  Intake      0 ml  Output    150 ml  Net   -150 ml   Filed Weights   09/28/14 2036 09/29/14 0150  Weight: 113.399 kg (250 lb) 109.408 kg (241 lb 3.2 oz)    Exam:   General:  NAD  Cardiovascular: bradycardic with 3/6 systolic ejection murmur.  Respiratory: clear to auscultation bilaterally.  GI: Soft/NT/ND/+BS  Musculoskeletal: No c/c/e  Data Reviewed: Basic Metabolic Panel:  Recent Labs Lab 09/28/14 2115 09/29/14 0650  NA 136* 138  K 3.8 4.0  CL 96 102  CO2 17* 20  GLUCOSE 308* 193*  BUN 77* 74*  CREATININE 5.15* 5.18*  CALCIUM 9.4 8.7   Liver Function Tests:  Recent Labs Lab 09/28/14 2115 09/29/14 0650  AST 11 8  ALT 9 6  ALKPHOS 82 66  BILITOT 0.4 0.3  PROT 7.3 6.0  ALBUMIN 3.6 2.8*  No results for input(s): LIPASE, AMYLASE in the last 168 hours.  Recent Labs Lab 09/28/14 2115  AMMONIA 24   CBC:  Recent Labs Lab 09/28/14 2115 09/29/14 0650  WBC 8.2 6.8  HGB 12.6* 10.8*  HCT  34.4* 29.5*  MCV 83.9 83.8  PLT 245 223   Cardiac Enzymes:  Recent Labs Lab 09/29/14 0045 09/29/14 0650  TROPONINI <0.30 <0.30   BNP (last 3 results) No results for input(s): PROBNP in the last 8760 hours. CBG:  Recent Labs Lab 09/28/14 2039 09/29/14 0105 09/29/14 0811  GLUCAP 241* 263* 178*    No results found for this or any previous visit (from the past 240 hour(s)).   Studies: Dg Chest 1 View  09/28/2014   CLINICAL DATA:  Left-sided arm and leg weakness. Memory loss. Diabetes. Mid chest pain. Code stroke.  EXAM: CHEST - 1 VIEW  COMPARISON:  08/06/2011  FINDINGS: Shallow inspiration. Mild cardiac enlargement with mild central pulmonary vascular congestion. No edema or consolidation. Atelectasis in the left lung base. Calcified and tortuous aorta. No pneumothorax.  IMPRESSION: Cardiac enlargement with mild pulmonary vascular congestion. No edema or consolidation. Atelectasis left lung base.   Electronically Signed   By: Lucienne Capers M.D.   On: 09/28/2014 21:54   Ct Head Wo Contrast  09/28/2014   CLINICAL DATA:  73 year old male with acute onset of left-sided arm and leg weakness, and memory loss at 8 p.m. this evening.  EXAM: CT HEAD WITHOUT CONTRAST  TECHNIQUE: Contiguous axial images were obtained from the base of the skull through the vertex without intravenous contrast.  COMPARISON:  Head CT 09/02/2009.  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 Mild cerebral atrophy. Patchy and confluent areas of decreased attenuation are noted throughout the deep and periventricular white matter of the cerebral hemispheres bilaterally, compatible with chronic microvascular ischemic disease. Physiologic calcifications in the basal ganglia bilaterally. No acute intracranial abnormalities. Specifically, no evidence of acute intracranial hemorrhage, no definite findings of acute/subacute cerebral ischemia, no mass, mass effect, hydrocephalus or abnormal intra or  extra-axial fluid collections. Visualized paranasal sinuses and mastoids are well pneumatized. No acute displaced skull fractures are identified.  IMPRESSION: 1. No acute intracranial abnormalities. 2. 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   Electronically Signed   By: Vinnie Langton M.D.   On: 09/28/2014 21:56    Scheduled Meds: . amLODipine  10 mg Oral Daily  . aspirin  81 mg Oral Daily  . atorvastatin  80 mg Oral q1800  . calcitRIOL  0.25 mcg Oral Daily  . heparin  5,000 Units Subcutaneous 3 times per day  . hydrALAZINE  50 mg Oral 4 times per day  . insulin aspart  0-9 Units Subcutaneous TID WC  . insulin detemir  20 Units Subcutaneous QHS  . nitroGLYCERIN  0.2 mg Transdermal Daily  . sodium chloride  3 mL Intravenous Q12H   Continuous Infusions:   Principal Problem:   Chest pain Active Problems:   AV node dysfunction   Hyperlipidemia   Hypertension   History of stroke   DM (diabetes mellitus), type 2   HLD (hyperlipidemia)   CAD (coronary artery disease)   CKD (chronic kidney disease), stage III   Acute on chronic renal failure    Time spent: 75 mins    [facility] MD [facility] Pager [phone]. If 7PM-7AM, please contact night-coverage at [email or web address], password  [email or web address] 09/29/2014, 11:29 AM  LOS: 1 day

## 2014-09-29 NOTE — Consult Note (Signed)
Cardiology Consultation  Jeffrey [name]    [id number] Apr 02, 1941  Reason for Consult: Chest pain  Requesting Physician: Blaine Hamper  Primary Cardiologist: Dr. Adria Dill  Nephrologist: Dr. Mercy Moore  HPI: [name] is a 73 year old Caucasian male who has a history of  Type 2 diabetes mellitus, long-standing history of hypertension, hyperlipidemia, remote history of stroke, and chronic kidney disease.  According to his wife, the patient has a several year history of atrial fibrillation and has been followed by Dr. Bridgette Habermann in [place].  He has never had a heart catheterization.  He has had a stress test a long time ago.  Last evening while he was sitting in a recliner chair.  He developed significant substernal chest discomfort associated with left arm radiation and felt as if he was having a heart attack.  He became somewhat disoriented.  He ultimately was brought to [facility] where he was seen and admitted by the hospitalist service.  He was felt to have new onset right bundle branch block on ECG and his creatinine on admission was 5.15. The patient had been on lisinopril, Hyzaar, clonidine, amlodipine, and furosemide at home and these were held with the exception of clonidine M amlodipine and the patient was also started on metoprolol. Cardiology consultation is now requested for further evaluation. Presently, he denies any residual chest pain.  He denies shortness of breath.  He has admitted to occasional leg swelling.   Past Medical History  Diagnosis Date  . Diabetes mellitus   . Hyperlipidemia   . Hypertension   . Chronic back pain   . Renal disorder    Past Surgical History  Procedure Laterality Date  . Cholecystectomy    . Esophagogastroduodenoscopy  08/01/2012    Procedure: ESOPHAGOGASTRODUODENOSCOPY (EGD);  Surgeon: [name], MD;  Location: [facility];  Service: Endoscopy;  Laterality: N/A;    FAMHx: Family History  Problem Relation Age of  Onset  . Diabetes Father   . Diabetes Sister     SOCHx:  reports that he quit smoking about 30 years ago. He does not have any smokeless tobacco history on file. He reports that he does not drink alcohol or use illicit drugs. He is retired from [employer].  He lives with his wife. He has 3 children  ALLERGIES: Allergies  Allergen Reactions  . Byetta 10 Mcg Pen [Exenatide] Nausea And Vomiting    ROS: A comprehensive review of systems was negative except for: obesity,chronic back pain, history of GERD,remote stroke without residual deficit, occasional leg swelling.  HOME MEDICATIONS: Prescriptions prior to admission  Medication Sig Dispense Refill Last Dose  . amLODipine (NORVASC) 10 MG tablet Take 10 mg by mouth daily.   09/28/2014 at Unknown time  . aspirin 81 MG chewable tablet Chew 81 mg by mouth daily.   09/28/2014 at Unknown time  . calcitRIOL (ROCALTROL) 0.25 MCG capsule Take 0.25 mcg by mouth daily.  6 09/28/2014 at Unknown time  . cloNIDine (CATAPRES) 0.1 MG tablet Take 0.1 mg by mouth 2 (two) times daily.  1 09/28/2014 at Unknown time  . furosemide (LASIX) 80 MG tablet Take 80 mg by mouth 2 (two) times daily.  6 09/28/2014 at Unknown time  . Insulin Detemir (LEVEMIR) 100 UNIT/ML Pen Inject 35 Units into the skin at bedtime.   09/27/2014 at Unknown time  . lisinopril (PRINIVIL,ZESTRIL) 40 MG tablet Take 40 mg by mouth 2 (two) times daily.   09/28/2014 at Unknown time  . nitroGLYCERIN (NITROSTAT) 0.4 MG  SL tablet Take 0.4 mg by mouth as needed.   09/28/2014 at Unknown time  . simvastatin (ZOCOR) 20 MG tablet TAKE 1 TABLET BY MOUTH EVERY NIGHT AT BEDTIME 30 tablet 3 09/27/2014 at Unknown time  . amLODipine (NORVASC) 10 MG tablet Take 10 mg by mouth daily.     Unknown at Unknown time  . glipiZIDE (GLUCOTROL) 10 MG tablet Take 10 mg by mouth 2 (two) times daily before a meal.     Unknown at Unknown time  . losartan-hydrochlorothiazide (HYZAAR) 100-25 MG per tablet Take 1 tablet by mouth  daily.     Unknown at Unknown time  . metFORMIN (GLUCOPHAGE) 1000 MG tablet Take 1,000 mg by mouth 2 (two) times daily with a meal.     Unknown at Unknown time    HOSPITAL MEDICATIONS: Scheduled: . amLODipine  10 mg Oral Daily  . aspirin  81 mg Oral Daily  . atorvastatin  80 mg Oral q1800  . calcitRIOL  0.25 mcg Oral Daily  . cloNIDine  0.1 mg Oral BID  . heparin  5,000 Units Subcutaneous 3 times per day  . hydrALAZINE  50 mg Oral 4 times per day  . insulin aspart  0-9 Units Subcutaneous TID WC  . insulin detemir  20 Units Subcutaneous QHS  . metoprolol tartrate  12.5 mg Oral BID  . nitroGLYCERIN  0.2 mg Transdermal Daily  . sodium chloride  3 mL Intravenous Q12H    VITALS: Blood pressure 141/78, pulse 84, temperature 98.2 F (36.8 C), temperature source Oral, resp. rate 18, height '5\' 11"'  (1.803 m), weight 241 lb 3.2 oz (109.408 kg), SpO2 96 %.  PHYSICAL EXAM: General appearance: alert and no distress Neck: no adenopathy, no carotid bruit, no JVD, supple, symmetrical, trachea midline and thyroid not enlarged, symmetric, no tenderness/mass/nodules Lungs: diminished breath sounds no wheezing Heart: Bradycardica and irregular; 2/6 harsh systolic murmur Abdomen: soft, non-tender; bowel sounds normal; no masses,  no organomegaly Extremities: Homans sign is negative, no sign of DVT Pulses: 2+ and symmetric Skin: Skin color, texture, turgor normal. No rashes or lesions Neurologic: Grossly normal  Psychologic: Slow affect  ECG (independently read by me): atrial fibrillation right bundle branch block with repolarization changes with initial rate in the 70s to 80s.  Stat EKG ordered by me : ? AV dissociation with ventricular rate in the low 40's  LABS: Results for orders placed or performed during the hospital encounter of 09/28/14 (from the past 48 hour(s))  POC CBG, ED     Status: Abnormal   Collection Time: 09/28/14  8:39 PM  Result Value Ref Range   Glucose-Capillary 241 (H) 70  - 99 mg/dL  CBC     Status: Abnormal   Collection Time: 09/28/14  9:15 PM  Result Value Ref Range   WBC 8.2 4.0 - 10.5 K/uL   RBC 4.10 (L) 4.22 - 5.81 MIL/uL   Hemoglobin 12.6 (L) 13.0 - 17.0 g/dL   HCT 34.4 (L) 39.0 - 52.0 %   MCV 83.9 78.0 - 100.0 fL   MCH 30.7 26.0 - 34.0 pg   MCHC 36.6 (H) 30.0 - 36.0 g/dL   RDW 12.9 11.5 - 15.5 %   Platelets 245 150 - 400 K/uL  Comprehensive metabolic panel     Status: Abnormal   Collection Time: 09/28/14  9:15 PM  Result Value Ref Range   Sodium 136 (L) 137 - 147 mEq/L   Potassium 3.8 3.7 - 5.3 mEq/L   Chloride 96 96 -  112 mEq/L   CO2 17 (L) 19 - 32 mEq/L   Glucose, Bld 308 (H) 70 - 99 mg/dL   BUN 77 (H) 6 - 23 mg/dL   Creatinine, Ser 5.15 (H) 0.50 - 1.35 mg/dL   Calcium 9.4 8.4 - 10.5 mg/dL   Total Protein 7.3 6.0 - 8.3 g/dL   Albumin 3.6 3.5 - 5.2 g/dL   AST 11 0 - 37 U/L   ALT 9 0 - 53 U/L   Alkaline Phosphatase 82 39 - 117 U/L   Total Bilirubin 0.4 0.3 - 1.2 mg/dL   GFR calc non Af Amer 10 (L) >90 mL/min   GFR calc Af Amer 11 (L) >90 mL/min    Comment: (NOTE) The eGFR has been calculated using the CKD EPI equation. This calculation has not been validated in all clinical situations. eGFR's persistently <90 mL/min signify possible Chronic Kidney Disease.    Anion gap 23 (H) 5 - 15    Comment: REPEATED TO VERIFY  Ammonia     Status: None   Collection Time: 09/28/14  9:15 PM  Result Value Ref Range   Ammonia 24 11 - 60 umol/L  I-stat troponin, ED (not at [facility])     Status: None   Collection Time: 09/28/14  9:20 PM  Result Value Ref Range   Troponin i, poc 0.03 0.00 - 0.08 ng/mL   Comment 3            Comment: Due to the release kinetics of cTnI, a negative result within the first hours of the onset of symptoms does not rule out myocardial infarction with certainty. If myocardial infarction is still suspected, repeat the test at appropriate intervals.   Urinalysis, Routine w reflex microscopic     Status: Abnormal    Collection Time: 09/28/14 10:57 PM  Result Value Ref Range   Color, Urine YELLOW YELLOW   APPearance CLEAR CLEAR   Specific Gravity, Urine 1.013 1.005 - 1.030   pH 5.5 5.0 - 8.0   Glucose, UA 500 (A) NEGATIVE mg/dL   Hgb urine dipstick SMALL (A) NEGATIVE   Bilirubin Urine NEGATIVE NEGATIVE   Ketones, ur NEGATIVE NEGATIVE mg/dL   Protein, ur >300 (A) NEGATIVE mg/dL   Urobilinogen, UA 0.2 0.0 - 1.0 mg/dL   Nitrite NEGATIVE NEGATIVE   Leukocytes, UA NEGATIVE NEGATIVE  Urine microscopic-add on     Status: Abnormal   Collection Time: 09/28/14 10:57 PM  Result Value Ref Range   Squamous Epithelial / LPF RARE RARE   WBC, UA 0-2 <3 WBC/hpf   RBC / HPF 0-2 <3 RBC/hpf   Casts GRANULAR CAST (A) NEGATIVE  Troponin I (q 6hr x 3)     Status: None   Collection Time: 09/29/14 12:45 AM  Result Value Ref Range   Troponin I <0.30 <0.30 ng/mL    Comment:        Due to the release kinetics of cTnI, a negative result within the first hours of the onset of symptoms does not rule out myocardial infarction with certainty. If myocardial infarction is still suspected, repeat the test at appropriate intervals.   CBG monitoring, ED     Status: Abnormal   Collection Time: 09/29/14  1:05 AM  Result Value Ref Range   Glucose-Capillary 263 (H) 70 - 99 mg/dL   Comment 1 Notify RN    Comment 2 Documented in Chart   Troponin I (q 6hr x 3)     Status: None   Collection Time:  09/29/14  6:50 AM  Result Value Ref Range   Troponin I <0.30 <0.30 ng/mL    Comment:        Due to the release kinetics of cTnI, a negative result within the first hours of the onset of symptoms does not rule out myocardial infarction with certainty. If myocardial infarction is still suspected, repeat the test at appropriate intervals.   Comprehensive metabolic panel     Status: Abnormal   Collection Time: 09/29/14  6:50 AM  Result Value Ref Range   Sodium 138 137 - 147 mEq/L   Potassium 4.0 3.7 - 5.3 mEq/L   Chloride 102 96 -  112 mEq/L   CO2 20 19 - 32 mEq/L   Glucose, Bld 193 (H) 70 - 99 mg/dL   BUN 74 (H) 6 - 23 mg/dL   Creatinine, Ser 5.18 (H) 0.50 - 1.35 mg/dL   Calcium 8.7 8.4 - 10.5 mg/dL   Total Protein 6.0 6.0 - 8.3 g/dL   Albumin 2.8 (L) 3.5 - 5.2 g/dL   AST 8 0 - 37 U/L   ALT 6 0 - 53 U/L   Alkaline Phosphatase 66 39 - 117 U/L   Total Bilirubin 0.3 0.3 - 1.2 mg/dL   GFR calc non Af Amer 10 (L) >90 mL/min   GFR calc Af Amer 11 (L) >90 mL/min    Comment: (NOTE) The eGFR has been calculated using the CKD EPI equation. This calculation has not been validated in all clinical situations. eGFR's persistently <90 mL/min signify possible Chronic Kidney Disease.    Anion gap 16 (H) 5 - 15  CBC     Status: Abnormal   Collection Time: 09/29/14  6:50 AM  Result Value Ref Range   WBC 6.8 4.0 - 10.5 K/uL   RBC 3.52 (L) 4.22 - 5.81 MIL/uL   Hemoglobin 10.8 (L) 13.0 - 17.0 g/dL   HCT 29.5 (L) 39.0 - 52.0 %   MCV 83.8 78.0 - 100.0 fL   MCH 30.7 26.0 - 34.0 pg   MCHC 36.6 (H) 30.0 - 36.0 g/dL   RDW 12.9 11.5 - 15.5 %   Platelets 223 150 - 400 K/uL  Protime-INR     Status: None   Collection Time: 09/29/14  6:50 AM  Result Value Ref Range   Prothrombin Time 14.6 11.6 - 15.2 seconds   INR 1.12 0.00 - 1.49  Lipid panel     Status: Abnormal   Collection Time: 09/29/14  6:50 AM  Result Value Ref Range   Cholesterol 120 0 - 200 mg/dL   Triglycerides 162 (H) <150 mg/dL   HDL 32 (L) >39 mg/dL   Total CHOL/HDL Ratio 3.8 RATIO   VLDL 32 0 - 40 mg/dL   LDL Cholesterol 56 0 - 99 mg/dL    Comment:        Total Cholesterol/HDL:CHD Risk Coronary Heart Disease Risk Table                     Men   Women  1/2 Average Risk   3.4   3.3  Average Risk       5.0   4.4  2 X Average Risk   9.6   7.1  3 X Average Risk  23.4   11.0        Use the calculated Patient Ratio above and the CHD Risk Table to determine the patient's CHD Risk.        ATP III CLASSIFICATION (  LDL):  <100     mg/dL   Optimal  100-129  mg/dL    Near or Above                    Optimal  130-159  mg/dL   Borderline  160-189  mg/dL   High  >190     mg/dL   Very High Performed at [facility]   Urine rapid drug screen (hosp performed)     Status: None   Collection Time: 09/29/14  7:20 AM  Result Value Ref Range   Opiates NONE DETECTED NONE DETECTED   Cocaine NONE DETECTED NONE DETECTED   Benzodiazepines NONE DETECTED NONE DETECTED   Amphetamines NONE DETECTED NONE DETECTED   Tetrahydrocannabinol NONE DETECTED NONE DETECTED   Barbiturates NONE DETECTED NONE DETECTED    Comment:        DRUG SCREEN FOR MEDICAL PURPOSES ONLY.  IF CONFIRMATION IS NEEDED FOR ANY PURPOSE, NOTIFY LAB WITHIN 5 DAYS.        LOWEST DETECTABLE LIMITS FOR URINE DRUG SCREEN Drug Class       Cutoff (ng/mL) Amphetamine      1000 Barbiturate      200 Benzodiazepine   163 Tricyclics       846 Opiates          300 Cocaine          300 THC              50   Glucose, capillary     Status: Abnormal   Collection Time: 09/29/14  8:11 AM  Result Value Ref Range   Glucose-Capillary 178 (H) 70 - 99 mg/dL    IMAGING: Dg Chest 1 View  09/28/2014   CLINICAL DATA:  Left-sided arm and leg weakness. Memory loss. Diabetes. Mid chest pain. Code stroke.  EXAM: CHEST - 1 VIEW  COMPARISON:  08/06/2011  FINDINGS: Shallow inspiration. Mild cardiac enlargement with mild central pulmonary vascular congestion. No edema or consolidation. Atelectasis in the left lung base. Calcified and tortuous aorta. No pneumothorax.  IMPRESSION: Cardiac enlargement with mild pulmonary vascular congestion. No edema or consolidation. Atelectasis left lung base.   Electronically Signed   By: Lucienne Capers M.D.   On: 09/28/2014 21:54   Ct Head Wo Contrast  09/28/2014   CLINICAL DATA:  73 year old male with acute onset of left-sided arm and leg weakness, and memory loss at 8 p.m. this evening.  EXAM: CT HEAD WITHOUT CONTRAST  TECHNIQUE: Contiguous axial images were obtained from the  base of the skull through the vertex without intravenous contrast.  COMPARISON:  Head CT 09/02/2009.  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 Mild cerebral atrophy. Patchy and confluent areas of decreased attenuation are noted throughout the deep and periventricular white matter of the cerebral hemispheres bilaterally, compatible with chronic microvascular ischemic disease. Physiologic calcifications in the basal ganglia bilaterally. No acute intracranial abnormalities. Specifically, no evidence of acute intracranial hemorrhage, no definite findings of acute/subacute cerebral ischemia, no mass, mass effect, hydrocephalus or abnormal intra or extra-axial fluid collections. Visualized paranasal sinuses and mastoids are well pneumatized. No acute displaced skull fractures are identified.  IMPRESSION: 1. No acute intracranial abnormalities. 2. 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   Electronically Signed   By: Vinnie Langton M.D.   On: 09/28/2014 21:56    IMPRESSION: 1. Probable A-V  dissociation with right bundle branch block rhythm 2. History of atrial fibrillation 3.  Chest pain, possible unstable angina 4.  Acute renal failure 5.  History of prior stroke 6. Type 2 diabetes mellitus with complications 7.  History of hyperlipidemia 8.  Chronic back pain  RECOMMENDATION: Telemetry and ECG has revealed significant bradycardia with transient heart rates in the low 40s and suggestion of A-V dissociation.  Will hold metoprolol and clonidine.  I recommend that the patient be transferred to [facility] stepdown with pacing pads.  He may ultimately require temporary/permanent pacemaker for sinus node dysfunction and advanced heart block.  Patient has significant cardiac risk factors for ischemic heart disease.  Recommend serial cardiac markers.  He now has acute  renal failure with creatinine in excess of 5, precluding diagnostic catheterization.  Recommend 2-D echo Doppler imaging to assess systolic and diastolic function and further evaluation of his harsh systolic murmur which may be representative of a component of aortic stenosis.  Ischemic evaluation will be necessary.  Consider electrophysiology evaluation if advanced heart block does not resolve with discontinuance of negative chronotropic medications. Recommend renal consultation.    Time Spent Directly with Patient: 60 minutes  Attending:  [name], MD, [facility] 09/29/2014 10:26 AM

## 2014-09-29 NOTE — Progress Notes (Signed)
Report called to [place] at [facility]. Hospital course reviewed and all questions answered. Callie Fielding RN

## 2014-09-30 ENCOUNTER — Encounter (HOSPITAL_COMMUNITY)
Admission: EM | Disposition: A | Discharge status: Home Health | Payer: Self-pay | Source: Home / Self Care | Attending: Internal Medicine

## 2014-09-30 DIAGNOSIS — E119 Type 2 diabetes mellitus without complications: Secondary | ICD-10-CM | POA: Insufficient documentation

## 2014-09-30 DIAGNOSIS — R7989 Other specified abnormal findings of blood chemistry: Secondary | ICD-10-CM

## 2014-09-30 DIAGNOSIS — Z8673 Personal history of transient ischemic attack (TIA), and cerebral infarction without residual deficits: Secondary | ICD-10-CM

## 2014-09-30 DIAGNOSIS — I2510 Atherosclerotic heart disease of native coronary artery without angina pectoris: Secondary | ICD-10-CM

## 2014-09-30 DIAGNOSIS — N185 Chronic kidney disease, stage 5: Secondary | ICD-10-CM

## 2014-09-30 DIAGNOSIS — R778 Other specified abnormalities of plasma proteins: Secondary | ICD-10-CM | POA: Diagnosis present

## 2014-09-30 DIAGNOSIS — I25119 Atherosclerotic heart disease of native coronary artery with unspecified angina pectoris: Secondary | ICD-10-CM

## 2014-09-30 DIAGNOSIS — I350 Nonrheumatic aortic (valve) stenosis: Secondary | ICD-10-CM

## 2014-09-30 HISTORY — PX: LEFT AND RIGHT HEART CATHETERIZATION WITH CORONARY ANGIOGRAM: SHX5449

## 2014-09-30 LAB — CBC WITH DIFFERENTIAL/PLATELET
Basophils Absolute: 0 10*3/uL (ref 0.0–0.1)
Basophils Relative: 0 % (ref 0–1)
EOS ABS: 0.2 10*3/uL (ref 0.0–0.7)
Eosinophils Relative: 3 % (ref 0–5)
HEMATOCRIT: 29.9 % — AB (ref 39.0–52.0)
HEMOGLOBIN: 10.7 g/dL — AB (ref 13.0–17.0)
LYMPHS ABS: 1.8 10*3/uL (ref 0.7–4.0)
Lymphocytes Relative: 22 % (ref 12–46)
MCH: 30.7 pg (ref 26.0–34.0)
MCHC: 35.8 g/dL (ref 30.0–36.0)
MCV: 85.7 fL (ref 78.0–100.0)
MONOS PCT: 8 % (ref 3–12)
Monocytes Absolute: 0.7 10*3/uL (ref 0.1–1.0)
NEUTROS PCT: 67 % (ref 43–77)
Neutro Abs: 5.4 10*3/uL (ref 1.7–7.7)
Platelets: 221 10*3/uL (ref 150–400)
RBC: 3.49 MIL/uL — AB (ref 4.22–5.81)
RDW: 13.2 % (ref 11.5–15.5)
WBC: 8 10*3/uL (ref 4.0–10.5)

## 2014-09-30 LAB — GLUCOSE, CAPILLARY
GLUCOSE-CAPILLARY: 134 mg/dL — AB (ref 70–99)
GLUCOSE-CAPILLARY: 180 mg/dL — AB (ref 70–99)
GLUCOSE-CAPILLARY: 133 mg/dL — AB (ref 70–99)
GLUCOSE-CAPILLARY: 190 mg/dL — AB (ref 70–99)
Glucose-Capillary: 129 mg/dL — ABNORMAL HIGH (ref 70–99)

## 2014-09-30 LAB — COMPREHENSIVE METABOLIC PANEL
ALBUMIN: 2.9 g/dL — AB (ref 3.5–5.2)
ALT: 5 U/L (ref 0–53)
AST: 9 U/L (ref 0–37)
Alkaline Phosphatase: 63 U/L (ref 39–117)
Anion gap: 16 — ABNORMAL HIGH (ref 5–15)
BUN: 76 mg/dL — ABNORMAL HIGH (ref 6–23)
CALCIUM: 8.5 mg/dL (ref 8.4–10.5)
CO2: 19 mEq/L (ref 19–32)
Chloride: 106 mEq/L (ref 96–112)
Creatinine, Ser: 5.67 mg/dL — ABNORMAL HIGH (ref 0.50–1.35)
GFR calc non Af Amer: 9 mL/min — ABNORMAL LOW (ref >90)
GFR, EST AFRICAN AMERICAN: 10 mL/min — AB (ref >90)
GLUCOSE: 146 mg/dL — AB (ref 70–99)
Potassium: 4.2 mEq/L (ref 3.7–5.3)
SODIUM: 141 meq/L (ref 137–147)
TOTAL PROTEIN: 5.8 g/dL — AB (ref 6.0–8.3)
Total Bilirubin: 0.4 mg/dL (ref 0.3–1.2)

## 2014-09-30 LAB — POCT I-STAT 3, ART BLOOD GAS (G3+)
Acid-base deficit: 7 mmol/L — ABNORMAL HIGH (ref 0.0–2.0)
BICARBONATE: 18.4 meq/L — AB (ref 20.0–24.0)
O2 Saturation: 93 %
PCO2 ART: 34.6 mmHg — AB (ref 35.0–45.0)
PH ART: 7.334 — AB (ref 7.350–7.450)
TCO2: 19 mmol/L (ref 0–100)
pO2, Arterial: 72 mmHg — ABNORMAL LOW (ref 80.0–100.0)

## 2014-09-30 LAB — POCT I-STAT 3, VENOUS BLOOD GAS (G3P V)
Acid-base deficit: 7 mmol/L — ABNORMAL HIGH (ref 0.0–2.0)
Bicarbonate: 19.3 mEq/L — ABNORMAL LOW (ref 20.0–24.0)
O2 SAT: 69 %
PO2 VEN: 39 mmHg (ref 30.0–45.0)
TCO2: 20 mmol/L (ref 0–100)
pCO2, Ven: 38.7 mmHg — ABNORMAL LOW (ref 45.0–50.0)
pH, Ven: 7.306 — ABNORMAL HIGH (ref 7.250–7.300)

## 2014-09-30 LAB — TROPONIN I
Troponin I: 1.46 ng/mL (ref <0.30)
Troponin I: 1.36 ng/mL (ref <0.30)
Troponin I: 1.08 ng/mL (ref <0.30)

## 2014-09-30 LAB — POCT ACTIVATED CLOTTING TIME: Activated Clotting Time: 118 seconds

## 2014-09-30 LAB — HEPARIN LEVEL (UNFRACTIONATED): Heparin Unfractionated: <0.1 IU/mL — ABNORMAL LOW (ref 0.30–0.70)

## 2014-09-30 LAB — MAGNESIUM: Magnesium: 2.5 mg/dL (ref 1.5–2.5)

## 2014-09-30 SURGERY — LEFT AND RIGHT HEART CATHETERIZATION WITH CORONARY ANGIOGRAM
Anesthesia: LOCAL

## 2014-09-30 MED ORDER — SODIUM CHLORIDE 0.9 % IJ SOLN
3.0000 mL | Freq: Two times a day (BID) | INTRAMUSCULAR | Status: DC
  Administered 2014-09-30: 3 mL via INTRAVENOUS

## 2014-09-30 MED ORDER — ACETAMINOPHEN 325 MG PO TABS: 650.0000 mg | ORAL_TABLET | ORAL | Status: DC | PRN

## 2014-09-30 MED ORDER — SODIUM CHLORIDE 0.9 % IV SOLN
INTRAVENOUS | Status: DC
  Administered 2014-09-30: 11:00:00 via INTRAVENOUS

## 2014-09-30 MED ORDER — HEPARIN SODIUM (PORCINE) 5000 UNIT/ML IJ SOLN
5000.0000 [IU] | Freq: Three times a day (TID) | INTRAMUSCULAR | Status: DC
  Administered 2014-10-01 – 2014-10-02 (×5): 5000 [IU] via SUBCUTANEOUS
  Filled 2014-09-30 (×7): qty 1

## 2014-09-30 MED ORDER — ASPIRIN 81 MG PO CHEW: 81.0000 mg | CHEWABLE_TABLET | ORAL | Status: DC

## 2014-09-30 MED ORDER — SODIUM CHLORIDE 0.9 % IV SOLN: 250.0000 mL | INTRAVENOUS | Status: DC | PRN

## 2014-09-30 MED ORDER — ONDANSETRON HCL 4 MG/2ML IJ SOLN: 4.0000 mg | Freq: Four times a day (QID) | INTRAMUSCULAR | Status: DC | PRN

## 2014-09-30 MED ORDER — HEPARIN (PORCINE) IN NACL 100-0.45 UNIT/ML-% IJ SOLN
1600.0000 [IU]/h | INTRAMUSCULAR | Status: DC
  Administered 2014-09-30: 1300 [IU]/h via INTRAVENOUS
  Filled 2014-09-30 (×2): qty 250

## 2014-09-30 MED ORDER — SODIUM CHLORIDE 0.9 % IJ SOLN: 3.0000 mL | INTRAMUSCULAR | Status: DC | PRN

## 2014-09-30 MED ORDER — SODIUM CHLORIDE 0.9 % IV SOLN
INTRAVENOUS | Status: DC
  Administered 2014-10-01 (×3): via INTRAVENOUS

## 2014-09-30 MED ORDER — FENTANYL CITRATE 0.05 MG/ML IJ SOLN
INTRAMUSCULAR | Status: AC
  Filled 2014-09-30: qty 2

## 2014-09-30 MED ORDER — HEPARIN BOLUS VIA INFUSION
3000.0000 [IU] | Freq: Once | INTRAVENOUS | Status: AC
  Administered 2014-09-30 (×2): 3000 [IU] via INTRAVENOUS
  Filled 2014-09-30: qty 3000

## 2014-09-30 MED ORDER — ASPIRIN EC 81 MG PO TBEC
81.0000 mg | DELAYED_RELEASE_TABLET | Freq: Every day | ORAL | Status: DC
  Administered 2014-10-01 – 2014-10-02 (×2): 81 mg via ORAL
  Filled 2014-09-30 (×2): qty 1

## 2014-09-30 MED ORDER — NITROGLYCERIN 1 MG/10 ML FOR IR/CATH LAB
INTRA_ARTERIAL | Status: AC
  Filled 2014-09-30: qty 10

## 2014-09-30 MED ORDER — HEPARIN (PORCINE) IN NACL 2-0.9 UNIT/ML-% IJ SOLN
INTRAMUSCULAR | Status: AC
  Filled 2014-09-30: qty 1000

## 2014-09-30 MED ORDER — LIDOCAINE HCL (PF) 1 % IJ SOLN
INTRAMUSCULAR | Status: AC
  Filled 2014-09-30: qty 30

## 2014-09-30 MED ORDER — MIDAZOLAM HCL 2 MG/2ML IJ SOLN
INTRAMUSCULAR | Status: AC
  Filled 2014-09-30: qty 2

## 2014-09-30 NOTE — Progress Notes (Signed)
ANTICOAGULATION CONSULT NOTE - Initial Consult  Pharmacy Consult for heparin  Indication: chest pain/ACS  Allergies  Allergen Reactions  . Byetta 10 Mcg Pen [Exenatide] Nausea And Vomiting    Patient Measurements: Height: 5\' 11"  (180.3 cm) Weight: 236 lb 4.8 oz (107.185 kg) IBW/kg (Calculated) : 75.3 Heparin Dosing Weight: 95 kg  Vital Signs: Temp: 98.1 F (36.7 C) (11/05 0745) Temp Source: Oral (11/05 0745) BP: 155/58 mmHg (11/05 0745) Pulse Rate: 66 (11/05 0745)  Labs:  Recent Labs  09/28/14 2115  09/29/14 0650 09/29/14 1158 09/30/14 0105 09/30/14 0801  HGB 12.6*  --  10.8*  --   --  10.7*  HCT 34.4*  --  29.5*  --   --  29.9*  PLT 245  --  223  --   --  221  LABPROT  --   --  14.6  --   --   --   INR  --   --  1.12  --   --   --   HEPARINUNFRC  --   --   --   --   --  <0.10*  CREATININE 5.15*  --  5.18*  --   --  5.67*  TROPONINI  --   < > <0.30 0.74* 1.46* 1.36*  < > = values in this interval not displayed.  Estimated Creatinine Clearance: 14 mL/min (by C-G formula based on Cr of 5.67).   Medical History: Past Medical History  Diagnosis Date  . Diabetes mellitus   . Hyperlipidemia   . Hypertension   . Chronic back pain   . Renal disorder     Medications:  Prescriptions prior to admission  Medication Sig Dispense Refill Last Dose  . amLODipine (NORVASC) 10 MG tablet Take 10 mg by mouth daily.   09/28/2014 at Unknown time  . aspirin 81 MG chewable tablet Chew 81 mg by mouth daily.   09/28/2014 at Unknown time  . calcitRIOL (ROCALTROL) 0.25 MCG capsule Take 0.25 mcg by mouth daily.  6 09/28/2014 at Unknown time  . cloNIDine (CATAPRES) 0.1 MG tablet Take 0.1 mg by mouth 2 (two) times daily.  1 09/28/2014 at Unknown time  . furosemide (LASIX) 80 MG tablet Take 80 mg by mouth 2 (two) times daily.  6 09/28/2014 at Unknown time  . Insulin Detemir (LEVEMIR) 100 UNIT/ML Pen Inject 35 Units into the skin at bedtime.   09/27/2014 at Unknown time  . lisinopril  (PRINIVIL,ZESTRIL) 40 MG tablet Take 40 mg by mouth 2 (two) times daily.   09/28/2014 at Unknown time  . simvastatin (ZOCOR) 20 MG tablet Take 20 mg by mouth at bedtime.   09/28/2014 at Unknown time  . amLODipine (NORVASC) 10 MG tablet Take 10 mg by mouth daily.     Unknown at Unknown time  . glipiZIDE (GLUCOTROL) 10 MG tablet Take 10 mg by mouth 2 (two) times daily before a meal.     Unknown at Unknown time  . losartan-hydrochlorothiazide (HYZAAR) 100-25 MG per tablet Take 1 tablet by mouth daily.     Unknown at Unknown time  . metFORMIN (GLUCOPHAGE) 1000 MG tablet Take 1,000 mg by mouth 2 (two) times daily with a meal.     Unknown at Unknown time  . nitroGLYCERIN (NITROSTAT) 0.4 MG SL tablet Take 0.4 mg by mouth as needed.   Completed Course at Unknown time    Assessment: 73 yo M admitted on 09/28/2014 with slightly elevated troponins and CP. Heparin gtt started and first  HL is undetectable. No issues with gtt per nurse. H/H low but stable since yesterday, plt wnl and stable with no reported s/s bleeding.   Goal of Therapy:  Heparin level 0.3-0.7 units/ml Monitor platelets by anticoagulation protocol: Yes   Plan:  - Increase heparin rate to 1600 u/hr - F/u plans after cath this afternoon - Continue to monitor for s/s bleeding  Harolyn Rutherford, PharmD Clinical Pharmacist - Resident Pager: [phone] Pharmacy: [phone] 09/30/2014 11:36 AM

## 2014-09-30 NOTE — Progress Notes (Signed)
Site area: RFA/RFV Site Prior to Removal:  Level 0 Pressure Applied For:20 min Manual:   Patient Status During Pull:  stable Post Pull Site:  Level 0 Post Pull Instructions Given:  yes Post Pull Pulses Present: palpable Dressing Applied:  clear Bedrest begins @ 1620 Comments:no complications

## 2014-09-30 NOTE — Progress Notes (Signed)
  [facility] Progress Note   Subjective: asked to see again as pt will likely need heart cath and possibly will need HD thereafter d/t dye exposure  Filed Vitals:   09/30/14 0400 09/30/14 0500 09/30/14 0600 09/30/14 0745  BP: 154/67 150/36 125/47 155/58  Pulse: 72 67 120 66  Temp:    98.1 F (36.7 C)  TempSrc:    Oral  Resp: 19 16 15 15   Height:      Weight:      SpO2: 97% 96% 90% 95%   Exam: Older adult male No rash, cyanosis or gangrene Chest clear bilat RRR 3/6 blowing syst murmur Abd soft, NTND No LE or UE edema Neuro is Ox 3 and nonfocal   Creat Oct 12th 2015 was 4.7 UA prot > 300, 5.5, 1.013, clear, neg nit/LE, 0-2 rbc/wbc, +gran cast CXR vasc congestion w/o edema.   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 ARB/acei on hold. Vol status is good.  7 Hx of CVA  Plan- for cath today, pt euvolemic, getting IV NS precath which is appropriate. Will follow.     Kelly Splinter MD  pager [phone]    cell [phone]  09/30/2014, 12:12 PM     Recent Labs Lab 09/28/14 2115 09/29/14 0650 09/30/14 0801  NA 136* 138 141  K 3.8 4.0 4.2  CL 96 102 106  CO2 17* 20 19  GLUCOSE 308* 193* 146*  BUN 77* 74* 76*  CREATININE 5.15* 5.18* 5.67*  CALCIUM 9.4 8.7 8.5    Recent Labs Lab 09/28/14 2115 09/29/14 0650 09/30/14 0801  AST 11 8 9   ALT 9 6 5   ALKPHOS 82 66 63  BILITOT 0.4 0.3 0.4  PROT 7.3 6.0 5.8*  ALBUMIN 3.6 2.8* 2.9*    Recent Labs Lab 09/28/14 2115 09/29/14 0650 09/30/14 0801  WBC 8.2 6.8 8.0  NEUTROABS  --   --  5.4  HGB 12.6* 10.8* 10.7*  HCT 34.4* 29.5* 29.9*  MCV 83.9 83.8 85.7  PLT 245 223 221   . amLODipine  10 mg Oral Daily  . aspirin  81 mg Oral Daily  . [START ON 10/01/2014] aspirin  81 mg Oral Pre-Cath  . atorvastatin  80 mg  Oral q1800  . calcitRIOL  0.25 mcg Oral Daily  . hydrALAZINE  50 mg Oral 4 times per day  . insulin aspart  0-9 Units Subcutaneous TID WC  . insulin detemir  20 Units Subcutaneous QHS  . nitroGLYCERIN  0.2 mg Transdermal Daily  . sodium chloride  3 mL Intravenous Q12H  . sodium chloride  3 mL Intravenous Q12H   . sodium chloride 100 mL/hr at 09/30/14 1120  . heparin 1,300 Units/hr (09/30/14 0225)   sodium chloride, sodium chloride, acetaminophen **OR** acetaminophen, hydrALAZINE, morphine injection, nitroGLYCERIN, ondansetron (ZOFRAN) IV, sodium chloride, sodium chloride

## 2014-09-30 NOTE — H&P (View-Only) (Signed)
SUBJECTIVE:  No further chest pain or SOB.  No acute complaints.  Family at bedside and report that at home he does get SOB walking short distances and does not do much activity, he attributes this to chronic right knee pain.  He denies any history of syncopal or near syncopal episodes.   OBJECTIVE:   Vitals:   Filed Vitals:   09/30/14 0400 09/30/14 0500 09/30/14 0600 09/30/14 0745  BP: 154/67 150/36 125/47 155/58  Pulse: 72 67 120 66  Temp:    98.1 F (36.7 C)  TempSrc:    Oral  Resp: 19 16 15 15   Height:      Weight:      SpO2: 97% 96% 90% 95%   I&O's:   Intake/Output Summary (Last 24 hours) at 09/30/14 1109 Last data filed at 09/30/14 0845  Gross per 24 hour  Intake    327 ml  Output   1750 ml  Net  -1423 ml   TELEMETRY: Reviewed telemetry pt in sinus rhythm with rate 70s-80s:  PHYSICAL EXAM General: Well developed, well nourished, in no acute distress Head:   Normal cephalic and atramatic  Lungs:  Clear bilaterally to auscultation. Heart:  RRR harsh 3/6 systolic murmur over aortic area.   Abdomen: abdomen soft and non-tender Extremities:  Trace edema.   Neuro: Alert and oriented. Psych:  Normal affect, responds appropriately Skin: No rash   LABS: Basic Metabolic Panel:  Recent Labs  09/29/14 0650 09/30/14 0801  NA 138 141  K 4.0 4.2  CL 102 106  CO2 20 19  GLUCOSE 193* 146*  BUN 74* 76*  CREATININE 5.18* 5.67*  CALCIUM 8.7 8.5  MG  --  2.5   Liver Function Tests:  Recent Labs  09/29/14 0650 09/30/14 0801  AST 8 9  ALT 6 5  ALKPHOS 66 63  BILITOT 0.3 0.4  PROT 6.0 5.8*  ALBUMIN 2.8* 2.9*   No results for input(s): LIPASE, AMYLASE in the last 72 hours. CBC:  Recent Labs  09/29/14 0650 09/30/14 0801  WBC 6.8 8.0  NEUTROABS  --  5.4  HGB 10.8* 10.7*  HCT 29.5* 29.9*  MCV 83.8 85.7  PLT 223 221   Cardiac Enzymes:  Recent Labs  09/29/14 1158 09/30/14 0105 09/30/14 0801  TROPONINI 0.74* 1.46* 1.36*   BNP: Invalid input(s):  POCBNP D-Dimer: No results for input(s): DDIMER in the last 72 hours. Hemoglobin A1C:  Recent Labs  09/29/14 0650  HGBA1C 6.8*   Fasting Lipid Panel:  Recent Labs  09/29/14 0650  CHOL 120  HDL 32*  LDLCALC 56  TRIG 162*  CHOLHDL 3.8   Thyroid Function Tests: No results for input(s): TSH, T4TOTAL, T3FREE, THYROIDAB in the last 72 hours.  Invalid input(s): FREET3 Anemia Panel: No results for input(s): VITAMINB12, FOLATE, FERRITIN, TIBC, IRON, RETICCTPCT in the last 72 hours. Coag Panel:   Lab Results  Component Value Date   INR 1.12 09/29/2014    RADIOLOGY: Dg Chest 1 View  09/28/2014   CLINICAL DATA:  Left-sided arm and leg weakness. Memory loss. Diabetes. Mid chest pain. Code stroke.  EXAM: CHEST - 1 VIEW  COMPARISON:  08/06/2011  FINDINGS: Shallow inspiration. Mild cardiac enlargement with mild central pulmonary vascular congestion. No edema or consolidation. Atelectasis in the left lung base. Calcified and tortuous aorta. No pneumothorax.  IMPRESSION: Cardiac enlargement with mild pulmonary vascular congestion. No edema or consolidation. Atelectasis left lung base.   Electronically Signed   By: Lucienne Capers  M.D.   On: 09/28/2014 21:54   Ct Head Wo Contrast  09/28/2014   CLINICAL DATA:  73 year old male with acute onset of left-sided arm and leg weakness, and memory loss at 8 p.m. this evening.  EXAM: CT HEAD WITHOUT CONTRAST  TECHNIQUE: Contiguous axial images were obtained from the base of the skull through the vertex without intravenous contrast.  COMPARISON:  Head CT 09/02/2009.  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 Mild cerebral atrophy. Patchy and confluent areas of decreased attenuation are noted throughout the deep and periventricular white matter of the cerebral hemispheres bilaterally, compatible with chronic microvascular ischemic disease. Physiologic calcifications in the basal ganglia bilaterally. No acute intracranial abnormalities.  Specifically, no evidence of acute intracranial hemorrhage, no definite findings of acute/subacute cerebral ischemia, no mass, mass effect, hydrocephalus or abnormal intra or extra-axial fluid collections. Visualized paranasal sinuses and mastoids are well pneumatized. No acute displaced skull fractures are identified.  IMPRESSION: 1. No acute intracranial abnormalities. 2. 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   Electronically Signed   By: Vinnie Langton M.D.   On: 09/28/2014 21:56   US Renal  09/30/2014   CLINICAL DATA:  Elevated creatinine.  EXAM: RENAL/URINARY TRACT ULTRASOUND COMPLETE  COMPARISON:  Renal ultrasound 11/27/2005  FINDINGS: Right Kidney:  Length: 10.7 cm. The right renal cortex is echogenic without cortical thinning. Negative for right hydronephrosis.  Left Kidney:  Length: 10.7 cm. Left renal cortex may be echogenic but difficult to evaluate the echogenicity. There is a round anechoic structure in the left kidney mid pole which is exophytic. This structure measures up to 3.2 cm and most compatible with a cyst. Previously, this cyst measured up to 2.1 cm.  Bladder:  Bladder is decompressed with a Foley catheter.  IMPRESSION: Negative for hydronephrosis.  Renal parenchyma appears to be echogenic. Findings could be associated with medical renal disease but nonspecific.  Left renal cyst.   Electronically Signed   By: Markus Daft M.D.   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 - Trop 0.74>1.46>1.36, echo shows severe aortic stenosis without evidence of LV dysfunction - Patient needs diagnostic cardiac cathertization as he will likely need AVR and possibly bypass.  Discussed this need with Nephrology (Dr. Jonnie Finner), this may mean that he will need dialysis sooner than expected (was to go for access  planning next week) - Placed patient NPO will take for cardiac cath later this afternoon.  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 - HR much improved today after discontinuation of nodal agents - No need for temporary pacemaker at this time.  CKD Stage 5: ACE and ARB have been held/discontinued.  Also holding Lasix as patient appears euvolemic.  Nephrology feels this is progression of his diabetic nephropathy and will need dialysis in the near future.  However this will likely be sooner than expected as will preform cardiac cath today given his NSTEMI and severe Aortic stenosis.  T2DM: On metformin, glipizide and levemir at home.  Metformin contraindicated in CKD5 and has been discontinued.   -Patient placed on SSI-S and Levemir 20 units -CBG this AM better controlled.  HTN: 155/58 this AM - Amlodipine 10mg  daily -Hydralazine 50mg  Q6 -Hydralazine prn - Home diuretics ACE and ARB and clonidine held.    Lucious Groves,  DO  09/30/2014  11:09 AM  I have examined the patient and reviewed assessment and plan and discussed with patient.  Agree with above as stated.  Patient with multiple medical issues.  Needs  Cath and possible AVR.  He had DOE prior to admission.  Exertion was limited.  He will likely need dialysis in the near future.  D/w Dr. Angelena Form regarding diagnostic cath only.  I think hold off on cardiac eval to preserve renal function would be dangerous given NSTEMI and symptomatic severe AS.  I don't think the valve needs to be crossed.  Obtain right heart pressures as well.  Procedure explained to the patient in detail and he is willing to proceed.   VARANASI,JAYADEEP S.

## 2014-09-30 NOTE — CV Procedure (Signed)
Jeffrey [name] is a 73 y.o. male   [id number]  [id number] LOCATION:  FACILITY: [facility]  PHYSICIAN: [name], MD, [facility] Mar 30, 1941   DATE OF PROCEDURE:  09/30/2014      RIGHT AND LEFT HEART CARDIAC CATHETERIZATION   HISTORY:  Jeffrey [name] is a 73 y.o. male who has a history of type 2 diabetes mellitus, a long-standing history of hypertension, hyperlipidemia, remote history of stroke, and chronic kidney disease.  He developed significant chest pain 2 nights ago associated with left arm radiation.  He's ruled in for non-ST segment elevation MI.  He transiently developed advanced heart block with AV dissociation following institution of beta blocker therapy.  His renal disease had progressed to stage V with a creatinine in excess of 5.  An echo Doppler study has well-preserved global LV function, but with evidence for significant aortic valve stenosis.  He is now referred for right and left heart catheterization.  He will be undergoing AV fistula for his renal disease.   PROCEDURE: Right and Left heart catheterization: Swan-Ganz catheterization, thermodilution an assumed Fick cardiac output determinations, coronary angiography, left ventricular pressure recording without ventriculography.  The patient was brought to the second floor [facility] cath lab in the postabsorptive state. Versed 2 mg and fentanyl 50 mcg were administered for conscious sedation. The right groin was prepped and draped in sterile fashion and a 5 [place] arterial sheath and 7 French venous sheath were inserted without difficulty. A Swan-Ganz catheter was advanced into the venous sheath and pressures were obtained in the right atrium, right ventricle, pulmonary artery, and pulmonary capillary wedge position. Cardiac outputs were obtained by the thermodilution and assumed Fick methods. Oxygen saturation was obtained in the pulmonary artery and aorta. A pigtail catheter was inserted and simultaneous AO/PA pressures  were recorded. The pigtail catheter was advanced into the left ventricle and simultaneous left ventricular and PCW pressures were recorded. Left ventriculography was performed in the RAO projection.  A left ventricle to aorta pullback was performed. The pigtail catheter was then removed and diagnostic catheterization to delineate the coronary anatomy was performed utilizing 5 French Judkins 4 left and right diagnostic catheters. All catheters were removed and the patient. Hemostasis was obtained by direct manual pressure. The patient tolerated the procedure well and returned to his room in satisfactory condition.   HEMODYNAMICS:   RA: 10 RV: 40/8 PA: 43/13 PC: mean 20 LV: 178/16  AO: 135/50  Oxygen saturation in the aorta 93% and the pulmonary artery 69%  Cardiac output: 7.2 l/min (Thermo); 8.6 (Fick)  Cardiac index: 3.2 l/m/m2                3.8  Aortic valve peak to peak gradient 43, mean gradient 43; aortic valve area 1.72 cm with an index of 0.76  ANGIOGRAPHY:   Fluoroscopy revealed coronary calcification as well as significant calcification of the aortic valve with reduced excursion.  Left main: moderate size vessel which bifurcated into the LAD and left circumflex coronary arteries  LAD: heart size vessel that had 50-60% stenosis distal to the first diagonal branch in the proximal segment.  There were moderate septal collaterals supplying the distal RCA.  The LAD gave rise to 3 diagonal branches and extended to the apex and was otherwise free of significant obstructive disease.  Left circumflex: moderate size vessel that gave rise to 2 marginal branches.  There was 30% smooth narrowing in the mid AV groove circumflex.  Right coronary artery: total occlusion  in the proximal to mid segment after a small RV branch.  There were extensive left-to-right collaterals supplying the PLA and PDA predominantly via septal perforating arteries originating from the LAD.  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  Calcified aortic valve with a 43 mm gradient in this patient with atrial fibrillation and calculated valve area of 1.72 cm  Mild pulmonary hypertension.  Total contrast: 70 cc Omnipaque  RECOMMENDATIONS:  Mr. Feik developed advanced heart block during this admission which may be due to total occlusion of his RCA and beta blocker therapy. This has subtotally resolved with discontinuance of beta blocker and clonidine. 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  He is followed by the renal service and may need initiation of dialysis.  He has aortic stenosis with a peak gradient at catheterization of 43 mm in the setting of atrial fibrillation and his valve area of 1.72 does not suggest severe AS.   [name], MD, [facility] 09/30/2014 3:59 PM

## 2014-09-30 NOTE — Progress Notes (Signed)
Utilization review completed. Oluwatomisin Deman, RN, BSN. 

## 2014-09-30 NOTE — Progress Notes (Signed)
OT Cancellation Note  Patient Details Name: [name] MRN: [id number] DOB: 1941/03/03   Cancelled Treatment:    Reason Eval/Treat Not Completed: Patient not medically ready - pt with strict bedrest orders.  Will initiate eval once activity orders progressed.    Darlina Rumpf [place], OTR/L [id number]  09/30/2014, 10:13 AM

## 2014-09-30 NOTE — Interval H&P Note (Signed)
History and Physical Interval Note: Cath Lab Visit (complete for each Cath Lab visit)  Clinical Evaluation Leading to the Procedure:   ACS: Yes.    Non-ACS:    Anginal Classification: CCS IV  Anti-ischemic medical therapy: Maximal Therapy (2 or more classes of medications)  Non-Invasive Test Results: No non-invasive testing performed  Prior CABG: No previous CABG       09/30/2014 2:48 PM  Jeffrey [name]  has presented today for surgery, with the diagnosis of diagnostic only  The various methods of treatment have been discussed with the patient and family. After consideration of risks, benefits and other options for treatment, the patient has consented to  Procedure(s): LEFT AND RIGHT HEART CATHETERIZATION WITH CORONARY ANGIOGRAM (N/A) as a surgical intervention .  The patient's history has been reviewed, patient examined, no change in status, stable for surgery.  I have reviewed the patient's chart and labs.  Questions were answered to the patient's satisfaction.     KELLY,THOMAS A

## 2014-09-30 NOTE — Progress Notes (Signed)
PT Cancellation Note  Patient Details Name: Jeffrey [name] MRN: [id number] DOB: 1941/08/26   Cancelled Treatment:    Reason Eval/Treat Not Completed: Medical issues which prohibited therapy (pt going for cardiac cath)   [facility] 09/30/2014, 1:58 PM

## 2014-09-30 NOTE — Progress Notes (Signed)
PT Cancellation Note  Patient Details Name: Jeffrey [name] MRN: [id number] DOB: 05-25-1941   Cancelled Treatment:    Reason Eval/Treat Not Completed: Other (comment) (Pt with strict bedrest orders written at same time as PT orders.) Need updated activity orders to evaluate pt. Will check back later today.   Janayia Burggraf 09/30/2014, 9:40 AM  Suanne Marker PT [phone]

## 2014-09-30 NOTE — Progress Notes (Signed)
ANTICOAGULATION CONSULT NOTE - Initial Consult  Pharmacy Consult for heparin  Indication: chest pain/ACS  Allergies  Allergen Reactions  . Byetta 10 Mcg Pen [Exenatide] Nausea And Vomiting    Patient Measurements: Height: 5\' 11"  (180.3 cm) Weight: 239 lb 6.7 oz (108.6 kg) IBW/kg (Calculated) : 75.3 Heparin Dosing Weight:   Vital Signs: Temp: 98.1 F (36.7 C) (11/04 2000) Temp Source: Oral (11/04 2000) BP: 146/40 mmHg (11/04 2200) Pulse Rate: 77 (11/04 2200)  Labs:  Recent Labs  09/28/14 2115 09/29/14 0045 09/29/14 0650 09/29/14 1158  HGB 12.6*  --  10.8*  --   HCT 34.4*  --  29.5*  --   PLT 245  --  223  --   LABPROT  --   --  14.6  --   INR  --   --  1.12  --   CREATININE 5.15*  --  5.18*  --   TROPONINI  --  <0.30 <0.30 0.74*    Estimated Creatinine Clearance: 15.4 mL/min (by C-G formula based on Cr of 5.18).   Medical History: Past Medical History  Diagnosis Date  . Diabetes mellitus   . Hyperlipidemia   . Hypertension   . Chronic back pain   . Renal disorder     Medications:  Prescriptions prior to admission  Medication Sig Dispense Refill Last Dose  . amLODipine (NORVASC) 10 MG tablet Take 10 mg by mouth daily.   09/28/2014 at Unknown time  . aspirin 81 MG chewable tablet Chew 81 mg by mouth daily.   09/28/2014 at Unknown time  . calcitRIOL (ROCALTROL) 0.25 MCG capsule Take 0.25 mcg by mouth daily.  6 09/28/2014 at Unknown time  . cloNIDine (CATAPRES) 0.1 MG tablet Take 0.1 mg by mouth 2 (two) times daily.  1 09/28/2014 at Unknown time  . furosemide (LASIX) 80 MG tablet Take 80 mg by mouth 2 (two) times daily.  6 09/28/2014 at Unknown time  . Insulin Detemir (LEVEMIR) 100 UNIT/ML Pen Inject 35 Units into the skin at bedtime.   09/27/2014 at Unknown time  . lisinopril (PRINIVIL,ZESTRIL) 40 MG tablet Take 40 mg by mouth 2 (two) times daily.   09/28/2014 at Unknown time  . simvastatin (ZOCOR) 20 MG tablet Take 20 mg by mouth at bedtime.   09/28/2014 at Unknown  time  . amLODipine (NORVASC) 10 MG tablet Take 10 mg by mouth daily.     Unknown at Unknown time  . glipiZIDE (GLUCOTROL) 10 MG tablet Take 10 mg by mouth 2 (two) times daily before a meal.     Unknown at Unknown time  . losartan-hydrochlorothiazide (HYZAAR) 100-25 MG per tablet Take 1 tablet by mouth daily.     Unknown at Unknown time  . metFORMIN (GLUCOPHAGE) 1000 MG tablet Take 1,000 mg by mouth 2 (two) times daily with a meal.     Unknown at Unknown time  . nitroGLYCERIN (NITROSTAT) 0.4 MG SL tablet Take 0.4 mg by mouth as needed.   Completed Course at Unknown time    Assessment: 73 yo with dm2 htn and stage v CKD admitted for afib with rvr and stroke like sx's (stroke rule out) now with elevated troponin of 0.74. Heparin per acs protocol.  Goal of Therapy:  Heparin level 0.3-0.7 units/ml Monitor platelets by anticoagulation protocol: Yes   Plan:  Heparin 3000 unit bolus then 1300 units/hr. 8 hour HL  Daily HL and cbc starting 11/6  Curlene Dolphin 09/30/2014,1:16 AM

## 2014-09-30 NOTE — Progress Notes (Signed)
SUBJECTIVE:  No further chest pain or SOB.  No acute complaints.  Family at bedside and report that at home he does get SOB walking short distances and does not do much activity, he attributes this to chronic right knee pain.  He denies any history of syncopal or near syncopal episodes.   OBJECTIVE:   Vitals:   Filed Vitals:   09/30/14 0400 09/30/14 0500 09/30/14 0600 09/30/14 0745  BP: 154/67 150/36 125/47 155/58  Pulse: 72 67 120 66  Temp:    98.1 F (36.7 C)  TempSrc:    Oral  Resp: 19 16 15 15   Height:      Weight:      SpO2: 97% 96% 90% 95%   I&O's:   Intake/Output Summary (Last 24 hours) at 09/30/14 1109 Last data filed at 09/30/14 0845  Gross per 24 hour  Intake    327 ml  Output   1750 ml  Net  -1423 ml   TELEMETRY: Reviewed telemetry pt in sinus rhythm with rate 70s-80s:  PHYSICAL EXAM General: Well developed, well nourished, in no acute distress Head:   Normal cephalic and atramatic  Lungs:  Clear bilaterally to auscultation. Heart:  RRR harsh 3/6 systolic murmur over aortic area.   Abdomen: abdomen soft and non-tender Extremities:  Trace edema.   Neuro: Alert and oriented. Psych:  Normal affect, responds appropriately Skin: No rash   LABS: Basic Metabolic Panel:  Recent Labs  09/29/14 0650 09/30/14 0801  NA 138 141  K 4.0 4.2  CL 102 106  CO2 20 19  GLUCOSE 193* 146*  BUN 74* 76*  CREATININE 5.18* 5.67*  CALCIUM 8.7 8.5  MG  --  2.5   Liver Function Tests:  Recent Labs  09/29/14 0650 09/30/14 0801  AST 8 9  ALT 6 5  ALKPHOS 66 63  BILITOT 0.3 0.4  PROT 6.0 5.8*  ALBUMIN 2.8* 2.9*   No results for input(s): LIPASE, AMYLASE in the last 72 hours. CBC:  Recent Labs  09/29/14 0650 09/30/14 0801  WBC 6.8 8.0  NEUTROABS  --  5.4  HGB 10.8* 10.7*  HCT 29.5* 29.9*  MCV 83.8 85.7  PLT 223 221   Cardiac Enzymes:  Recent Labs  09/29/14 1158 09/30/14 0105 09/30/14 0801  TROPONINI 0.74* 1.46* 1.36*   BNP: Invalid input(s):  POCBNP D-Dimer: No results for input(s): DDIMER in the last 72 hours. Hemoglobin A1C:  Recent Labs  09/29/14 0650  HGBA1C 6.8*   Fasting Lipid Panel:  Recent Labs  09/29/14 0650  CHOL 120  HDL 32*  LDLCALC 56  TRIG 162*  CHOLHDL 3.8   Thyroid Function Tests: No results for input(s): TSH, T4TOTAL, T3FREE, THYROIDAB in the last 72 hours.  Invalid input(s): FREET3 Anemia Panel: No results for input(s): VITAMINB12, FOLATE, FERRITIN, TIBC, IRON, RETICCTPCT in the last 72 hours. Coag Panel:   Lab Results  Component Value Date   INR 1.12 09/29/2014    RADIOLOGY: Dg Chest 1 View  09/28/2014   CLINICAL DATA:  Left-sided arm and leg weakness. Memory loss. Diabetes. Mid chest pain. Code stroke.  EXAM: CHEST - 1 VIEW  COMPARISON:  08/06/2011  FINDINGS: Shallow inspiration. Mild cardiac enlargement with mild central pulmonary vascular congestion. No edema or consolidation. Atelectasis in the left lung base. Calcified and tortuous aorta. No pneumothorax.  IMPRESSION: Cardiac enlargement with mild pulmonary vascular congestion. No edema or consolidation. Atelectasis left lung base.   Electronically Signed   By: Lucienne Capers  M.D.   On: 09/28/2014 21:54   Ct Head Wo Contrast  09/28/2014   CLINICAL DATA:  73 year old male with acute onset of left-sided arm and leg weakness, and memory loss at 8 p.m. this evening.  EXAM: CT HEAD WITHOUT CONTRAST  TECHNIQUE: Contiguous axial images were obtained from the base of the skull through the vertex without intravenous contrast.  COMPARISON:  Head CT 09/02/2009.  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 Mild cerebral atrophy. Patchy and confluent areas of decreased attenuation are noted throughout the deep and periventricular white matter of the cerebral hemispheres bilaterally, compatible with chronic microvascular ischemic disease. Physiologic calcifications in the basal ganglia bilaterally. No acute intracranial abnormalities.  Specifically, no evidence of acute intracranial hemorrhage, no definite findings of acute/subacute cerebral ischemia, no mass, mass effect, hydrocephalus or abnormal intra or extra-axial fluid collections. Visualized paranasal sinuses and mastoids are well pneumatized. No acute displaced skull fractures are identified.  IMPRESSION: 1. No acute intracranial abnormalities. 2. 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   Electronically Signed   By: Vinnie Langton M.D.   On: 09/28/2014 21:56   US Renal  09/30/2014   CLINICAL DATA:  Elevated creatinine.  EXAM: RENAL/URINARY TRACT ULTRASOUND COMPLETE  COMPARISON:  Renal ultrasound 11/27/2005  FINDINGS: Right Kidney:  Length: 10.7 cm. The right renal cortex is echogenic without cortical thinning. Negative for right hydronephrosis.  Left Kidney:  Length: 10.7 cm. Left renal cortex may be echogenic but difficult to evaluate the echogenicity. There is a round anechoic structure in the left kidney mid pole which is exophytic. This structure measures up to 3.2 cm and most compatible with a cyst. Previously, this cyst measured up to 2.1 cm.  Bladder:  Bladder is decompressed with a Foley catheter.  IMPRESSION: Negative for hydronephrosis.  Renal parenchyma appears to be echogenic. Findings could be associated with medical renal disease but nonspecific.  Left renal cyst.   Electronically Signed   By: Markus Daft M.D.   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 - Trop 0.74>1.46>1.36, echo shows severe aortic stenosis without evidence of LV dysfunction - Patient needs diagnostic cardiac cathertization as he will likely need AVR and possibly bypass.  Discussed this need with Nephrology (Dr. Jonnie Finner), this may mean that he will need dialysis sooner than expected (was to go for access  planning next week) - Placed patient NPO will take for cardiac cath later this afternoon.  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 - HR much improved today after discontinuation of nodal agents - No need for temporary pacemaker at this time.  CKD Stage 5: ACE and ARB have been held/discontinued.  Also holding Lasix as patient appears euvolemic.  Nephrology feels this is progression of his diabetic nephropathy and will need dialysis in the near future.  However this will likely be sooner than expected as will preform cardiac cath today given his NSTEMI and severe Aortic stenosis.  T2DM: On metformin, glipizide and levemir at home.  Metformin contraindicated in CKD5 and has been discontinued.   -Patient placed on SSI-S and Levemir 20 units -CBG this AM better controlled.  HTN: 155/58 this AM - Amlodipine 10mg  daily -Hydralazine 50mg  Q6 -Hydralazine prn - Home diuretics ACE and ARB and clonidine held.    Lucious Groves,  DO  09/30/2014  11:09 AM  I have examined the patient and reviewed assessment and plan and discussed with patient.  Agree with above as stated.  Patient with multiple medical issues.  Needs  Cath and possible AVR.  He had DOE prior to admission.  Exertion was limited.  He will likely need dialysis in the near future.  D/w Dr. Angelena Form regarding diagnostic cath only.  I think hold off on cardiac eval to preserve renal function would be dangerous given NSTEMI and symptomatic severe AS.  I don't think the valve needs to be crossed.  Obtain right heart pressures as well.  Procedure explained to the patient in detail and he is willing to proceed.   Chales Pelissier S.

## 2014-09-30 NOTE — Progress Notes (Signed)
[facility]  VATCHE BERMAN [id number] DOB: February 15, 1941 DOA: 09/28/2014 PCP: No primary care provider on file.   Brief narrative: 73 year old male patient with known history of diabetes, hypertension dyslipidemia CAD and chronic kidney disease stage V. He presented to the emergency department because of chest pain. Pain was constant and very sharp and radiated to the left arm from the substernal area. Did not have any pleuritic qualities. No associated constitutional symptoms. According to the patient's wife at time of admission the patient was confused for about 2 hours after his chest pain started.  In the ER the patient's troponin was negative. He had a new right bundle branch blockon 12-lead EKG. He was hypertensive with a blood pressure 159/119. His last known creatinine from 2012 was 1.26. Creatinine this admission 5.15.  Since admission patient has developed sinus bradycardia with transient rates in the 40s and concerns for A-V dissociation and might require permanent pacemaker. 2D echocardiogram completed 11/4 demonstrated normal LV function, severe aortic stenosis.Cardiologist at [facility] requested the patient transferred to [facility] for further evaluation for pacemaker.  The patient has been evaluated by nephrology who confirmed that since 2012 patient has experienced progression in his chronic kidney disease and his baseline creatinine is around 4.7. The patient has been scheduled for outpatient evaluation for permanent dialysis access.  After transfer to [facility] patient's troponins bumped. Subsequently cardiologist has now ordered cardiac catheterization in setting of chest pain with heart block and severe aortic stenosis. There are concerns patient may need valve replacement and bypass surgery. Given patient's underlying severe chronic kidney disease the cardiologist did discuss this with Dr. Jonnie Finner of Nephrology. Lasix has been  discontinued and patient is being gently hydrated. Preadmission ARB and ACE inhibitor on hold.    HPI/Subjective: Currently denies chest pain. Endorsing dyspnea on exertion which has been worse over the past 2 weeks. Denies lower extremity edema.  Assessment/Plan: Active Problems:   Chest pain/elevated troponin In setting of heart block, chronic kidney disease but nonetheless is concerning for underlying ischemia-plan is for cardiac catheterization today-appreciate cardiology assistance-continue IV heparin, aspirin, statin and nitrate    AV node dysfunction Currently hemodynamically stable with varying rhythms including second-degree heart block-heart rate has improved with discontinuation of preadmission AV nodal blocking agents-no indication for temporary pacing at this time    Severe aortic stenosis Had known moderate aortic stenosis documented several years prior but now severe-see above regarding heart catheterization-cardiologist anticipates will need surgical repair    DM (diabetes mellitus), type 2 On Glucotrol and metformin and Levemir prior to admission-given low GFR and anticipated need for dialysis metformin contraindicated and Glucotrol has higher risk for symptomatic hypoglycemia in dialysis patients of both of these medications have been discontinued in favor of Levemir only-hemoglobin A1c 6.8-diabetes educator consult    CKD (chronic kidney disease), stage V Appreciate nephrology assistance-for cardiac cath today i.e. Dye load-plan gentle hydration pre-and post procedure-monitor carefully for worsening of renal function in the event needs to begin dialysis this admission    Essential hypertension Moderate control-ARB and ACE inhibitor discontinued due to renal function-continue Apresoline, Norvasc and Nitro-Dur patch    History of stroke    HLD (hyperlipidemia)    DVT prophylaxis: IV heparin Code Status: full Family Communication: family member at bedside but  asleep Disposition Plan/Expected LOS: stepdown   Consultants: Cardiology Nephrology  Procedures: Cardiac catheterization pending  2-D echocardiogram: - Left ventricle: The cavity size was normal.  There was severe concentric hypertrophy. Systolic function was vigorous. The estimated ejection fraction was in the range of 65% to 70%. Wall motion was normal; there were no regional wall motion abnormalities. - Aortic valve: Cusp separation was reduced. There was severe stenosis. Peak velocity is 4.46m/s. Mean gradient (S): 41 mm Hg. - Left atrium: The atrium was mildly dilated. - Right ventricle: The cavity size was mildly dilated. Wall thickness was normal. - Right atrium: The atrium was mildly dilated.  Cultures: none  Antibiotics: none  Objective: Blood pressure 159/90, pulse 84, temperature 97.8 F (36.6 C), temperature source Oral, resp. rate 19, height 5\' 11"  (1.803 m), weight 236 lb 4.8 oz (107.185 kg), SpO2 94 %.  Intake/Output Summary (Last 24 hours) at 09/30/14 1328 Last data filed at 09/30/14 0845  Gross per 24 hour  Intake    327 ml  Output   1050 ml  Net   -723 ml     Exam: Gen: No acute respiratory distress-alert and denies chest pain Chest: Clear to auscultation bilaterally without wheezes, rhonchi or crackles, room air Cardiac: Regular occasionally bradycardic rate and rhythm, S1-S2, no rubs, holosystolic murmur. negative gallops, no peripheral edema, no JVD Abdomen: Soft nontender nondistended without obvious hepatosplenomegaly, no ascites Extremities: Symmetrical in appearance without cyanosis, clubbing or effusion  Scheduled Meds:  Scheduled Meds: . amLODipine  10 mg Oral Daily  . aspirin  81 mg Oral Daily  . [START ON 10/01/2014] aspirin  81 mg Oral Pre-Cath  . atorvastatin  80 mg Oral q1800  . calcitRIOL  0.25 mcg Oral Daily  . hydrALAZINE  50 mg Oral 4 times per day  . insulin aspart  0-9 Units Subcutaneous TID WC  . insulin detemir   20 Units Subcutaneous QHS  . nitroGLYCERIN  0.2 mg Transdermal Daily  . sodium chloride  3 mL Intravenous Q12H  . sodium chloride  3 mL Intravenous Q12H   Continuous Infusions: . sodium chloride 100 mL/hr at 09/30/14 1120  . heparin 1,600 Units/hr (09/30/14 1224)    Data Reviewed: Basic Metabolic Panel:  Recent Labs Lab 09/28/14 2115 09/29/14 0650 09/30/14 0801  NA 136* 138 141  K 3.8 4.0 4.2  CL 96 102 106  CO2 17* 20 19  GLUCOSE 308* 193* 146*  BUN 77* 74* 76*  CREATININE 5.15* 5.18* 5.67*  CALCIUM 9.4 8.7 8.5  MG  --   --  2.5   Liver Function Tests:  Recent Labs Lab 09/28/14 2115 09/29/14 0650 09/30/14 0801  AST 11 8 9   ALT 9 6 5   ALKPHOS 82 66 63  BILITOT 0.4 0.3 0.4  PROT 7.3 6.0 5.8*  ALBUMIN 3.6 2.8* 2.9*   No results for input(s): LIPASE, AMYLASE in the last 168 hours.  Recent Labs Lab 09/28/14 2115  AMMONIA 24   CBC:  Recent Labs Lab 09/28/14 2115 09/29/14 0650 09/30/14 0801  WBC 8.2 6.8 8.0  NEUTROABS  --   --  5.4  HGB 12.6* 10.8* 10.7*  HCT 34.4* 29.5* 29.9*  MCV 83.9 83.8 85.7  PLT 245 223 221   Cardiac Enzymes:  Recent Labs Lab 09/29/14 0045 09/29/14 0650 09/29/14 1158 09/30/14 0105 09/30/14 0801  TROPONINI <0.30 <0.30 0.74* 1.46* 1.36*   BNP (last 3 results) No results for input(s): PROBNP in the last 8760 hours. CBG:  Recent Labs Lab 09/29/14 1133 09/29/14 1652 09/29/14 2215 09/30/14 0801 09/30/14 1210  GLUCAP 125* 156* 255* 134* 180*    Recent Results (from the past 240  hour(s))  MRSA PCR Screening     Status: None   Collection Time: 09/29/14  1:17 PM  Result Value Ref Range Status   MRSA by PCR NEGATIVE NEGATIVE Final    Comment:        The GeneXpert MRSA Assay (FDA approved for NASAL specimens only), is one component of a comprehensive MRSA colonization surveillance program. It is not intended to diagnose MRSA infection nor to guide or monitor treatment for MRSA infections.       Studies:  Recent x-ray studies have been reviewed in detail by the Attending Physician  Time spent :      Erin Hearing, [facility] Office  [phone] Pager [phone]   **If unable to reach the above provider after paging please contact the [facility] @ [phone]  On-Call/Text Page:      [email or web address]      password [email or web address]  If 7PM-7AM, please contact night-coverage [email or web address] Password [email or web address] 09/30/2014, 1:28 PM   LOS: 2 days  Examined patient and discussed assessment and plan with ANP Ebony Hail and agree with above. Patient with multiple complex medical problems> 35 minutes spent in direct patient care

## 2014-10-01 ENCOUNTER — Encounter (HOSPITAL_COMMUNITY): Payer: Self-pay | Admitting: Interventional Cardiology

## 2014-10-01 DIAGNOSIS — I350 Nonrheumatic aortic (valve) stenosis: Secondary | ICD-10-CM

## 2014-10-01 DIAGNOSIS — N179 Acute kidney failure, unspecified: Secondary | ICD-10-CM | POA: Insufficient documentation

## 2014-10-01 LAB — BASIC METABOLIC PANEL
Anion gap: 17 — ABNORMAL HIGH (ref 5–15)
BUN: 70 mg/dL — ABNORMAL HIGH (ref 6–23)
CO2: 16 meq/L — AB (ref 19–32)
Calcium: 8 mg/dL — ABNORMAL LOW (ref 8.4–10.5)
Chloride: 107 mEq/L (ref 96–112)
Creatinine, Ser: 5.15 mg/dL — ABNORMAL HIGH (ref 0.50–1.35)
GFR calc Af Amer: 11 mL/min — ABNORMAL LOW (ref >90)
GFR calc non Af Amer: 10 mL/min — ABNORMAL LOW (ref >90)
GLUCOSE: 158 mg/dL — AB (ref 70–99)
Potassium: 4.2 mEq/L (ref 3.7–5.3)
Sodium: 140 mEq/L (ref 137–147)

## 2014-10-01 LAB — CBC
HCT: 32.5 % — ABNORMAL LOW (ref 39.0–52.0)
HEMOGLOBIN: 11 g/dL — AB (ref 13.0–17.0)
MCH: 29.6 pg (ref 26.0–34.0)
MCHC: 33.8 g/dL (ref 30.0–36.0)
MCV: 87.6 fL (ref 78.0–100.0)
Platelets: 221 10*3/uL (ref 150–400)
RBC: 3.71 MIL/uL — AB (ref 4.22–5.81)
RDW: 13.4 % (ref 11.5–15.5)
WBC: 7.9 10*3/uL (ref 4.0–10.5)

## 2014-10-01 LAB — GLUCOSE, CAPILLARY
GLUCOSE-CAPILLARY: 161 mg/dL — AB (ref 70–99)
GLUCOSE-CAPILLARY: 179 mg/dL — AB (ref 70–99)
GLUCOSE-CAPILLARY: 178 mg/dL — AB (ref 70–99)
Glucose-Capillary: 134 mg/dL — ABNORMAL HIGH (ref 70–99)

## 2014-10-01 NOTE — Progress Notes (Signed)
  [facility] Progress Note   Subjective: doing well, had heart cath yesterday ; AS was not as bad as thought previously . Also had 100% RCA lesion w collaterals from L side, 60% LAD and no LCx lesions. No intervention needed.  70 cc dye and creat is down today, up in chair, no SOB. Good UOP.   Filed Vitals:   10/01/14 0500 10/01/14 0600 10/01/14 0748 10/01/14 1044  BP: 138/77 126/44 150/53 174/62  Pulse: 77 84 89   Temp:   98.4 F (36.9 C)   TempSrc:   Oral   Resp: 19 15 21    Height:      Weight: 109.2 kg (240 lb 11.9 oz)     SpO2: 97% 97% 96%    Exam: Older adult male No rash, cyanosis or gangrene Chest clear bilat RRR 3/6 blowing syst murmur Abd soft, NTND No LE or UE edema Neuro is Ox 3 and nonfocal   Creat Oct 12th 2015 was 4.7 UA prot > 300, 5.5, 1.013, clear, neg nit/LE, 0-2 rbc/wbc, +gran cast CXR vasc congestion w/o edema.   Assessment: 1 CKD 5 - stable renal function today after heart cath yest 2 NSTEMI / chest pain - 2VCAD by cath, no PCI needed 3 Bradycardia with A-V dissociation- better off BB, clon 4 AS- not severe by cath 5 DM longstanding w retinopathy 6 HTN on amlod and hydralazine. ARB/acei on hold. Vol status is good.  7 Hx of CVA  Plan- decrease IVF's 75/hr, check creat am.  Doing well from renal standpoint.     Kelly Splinter MD  pager [phone]    cell [phone]  10/01/2014, 11:17 AM     Recent Labs Lab 09/29/14 0650 09/30/14 0801 10/01/14 0226  NA 138 141 140  K 4.0 4.2 4.2  CL 102 106 107  CO2 20 19 16*  GLUCOSE 193* 146* 158*  BUN 74* 76* 70*  CREATININE 5.18* 5.67* 5.15*  CALCIUM 8.7 8.5 8.0*    Recent Labs Lab 09/28/14 2115 09/29/14 0650 09/30/14 0801  AST 11 8 9   ALT 9 6 5   ALKPHOS 82 66 63  BILITOT 0.4 0.3 0.4  PROT 7.3 6.0 5.8*  ALBUMIN 3.6 2.8* 2.9*    Recent Labs Lab 09/29/14 0650 09/30/14 0801 10/01/14 0226  WBC 6.8 8.0 7.9  NEUTROABS  --  5.4  --   HGB 10.8* 10.7* 11.0*  HCT 29.5*  29.9* 32.5*  MCV 83.8 85.7 87.6  PLT 223 221 221   . amLODipine  10 mg Oral Daily  . aspirin EC  81 mg Oral Daily  . atorvastatin  80 mg Oral q1800  . calcitRIOL  0.25 mcg Oral Daily  . heparin  5,000 Units Subcutaneous 3 times per day  . hydrALAZINE  50 mg Oral 4 times per day  . insulin aspart  0-9 Units Subcutaneous TID WC  . insulin detemir  20 Units Subcutaneous QHS  . sodium chloride  3 mL Intravenous Q12H   . sodium chloride 125 mL/hr at 10/01/14 0537   sodium chloride, acetaminophen, hydrALAZINE, morphine injection, nitroGLYCERIN, ondansetron (ZOFRAN) IV, ondansetron (ZOFRAN) IV, sodium chloride

## 2014-10-01 NOTE — Progress Notes (Signed)
[name] [id number] DOB: 05-Dec-1940 DOA: 09/28/2014 PCP: No primary care provider on file.   Brief narrative: 73 year old male patient with known history of diabetes, hypertension dyslipidemia CAD and chronic kidney disease stage V. He presented to the emergency department because of chest pain. Pain was constant and very sharp and radiated to the left arm from the substernal area. Did not have any pleuritic qualities. No associated constitutional symptoms. According to the patient's wife at time of admission the patient was confused for about 2 hours after his chest pain started.  In the ER the patient's troponin was negative. He had a new right bundle branch blockon 12-lead EKG. He was hypertensive with a blood pressure 159/119. His last known creatinine from 2012 was 1.26. Creatinine this admission 5.15.  Since admission patient has developed sinus bradycardia with transient rates in the 40s and concerns for A-V dissociation and might require permanent pacemaker. 2D echocardiogram completed 11/4 demonstrated normal LV function, severe aortic stenosis.Cardiologist at [facility] requested the patient transferred to [facility] for further evaluation for pacemaker.  The patient has been evaluated by nephrology who confirmed that since 2012 patient has experienced progression in his chronic kidney disease and his baseline creatinine is around 4.7. The patient has been scheduled for outpatient evaluation for permanent dialysis access.  After transfer to [facility] patient's troponins bumped. Subsequently cardiologist has now ordered cardiac catheterization in setting of chest pain with heart block and severe aortic stenosis. There are concerns patient may need valve replacement and bypass surgery. Given patient's underlying severe chronic kidney disease the cardiologist did discuss this with Dr. Jonnie Finner of Nephrology. Lasix has been  discontinued and patient is being gently hydrated. Preadmission ARB and ACE inhibitor on hold.  Cardiac cath revealed primarily non obstructive CAD although did have significant disease RCA but fed by multiple collaterals. Aortic stenosis better clarified on cath as moderate so no surgery indicated. Paln at this point is to focus on medical therapy. Renal function has remained stable post cath and if remains stable in am 11/7 could possibly be ready to dc home. Plan is to have PT/OT evaluate pt.   HPI/Subjective: No CP at rest. Family reported pt with frequent urination and dribbling pre admit.  Assessment/Plan: Active Problems:   Chest pain/elevated troponin In setting of heart block, chronic kidney disease-cardiac catheterization 11/5 with non obstructive CAD except for severe dz RCA (fed by collaterals)-appreciate cardiology assistance-continue aspirin, statin and nitrate    AV node dysfunction Currently hemodynamically stable -heart rate improved with discontinuation of preadmission AV nodal blocking agents-no indication for temporary pacing at this time-11/6 Cards repeating EKG since rhythm today appears junctional    Severe aortic stenosis Had known moderate aortic stenosis documented several years prior -ECHO this admit with severe Aos but cath clarified as moderate    DM (diabetes mellitus), type 2 On Glucotrol and metformin and Levemir prior to admission-given low GFR and anticipated need for dialysis metformin contraindicated and Glucotrol has higher risk for symptomatic hypoglycemia in dialysis patients of both of these medications have been discontinued in favor of Levemir only-hemoglobin A1c 6.8-diabetes educator consult    CKD (chronic kidney disease), stage V Appreciate nephrology assistance--thus far renal function stable post cath-Nephro has decreased IVFs to 75/hr    Essential hypertension Moderate control-ARB and ACE inhibitor discontinued due to renal function/unclear if  should resume at dc-continue Apresoline, Norvasc and Nitro-Dur patch   Physical  Deconditioning PT/OT eval-HHPT recommended as well as RW with wheels so will complete face to face    History of stroke    HLD (hyperlipidemia)    DVT prophylaxis: IV heparin Code Status: full Family Communication: Multiple family members at the bedside Disposition Plan/Expected LOS: Transfer to Telemetry but keep on Team 1 since anticipate dc 11/7   Consultants: Cardiology Nephrology  Procedures: Cardiac catheterization: Significant coronary obstructive disease with coronary calcification and total occlusion of the proximal to mid RCA with extensive left-to-right collaterals; 50 - 60% stenosis in the proximal LAD after the first diagonal branch, and 30% mid AV groove circumflex stenoses.Calcified aortic valve with a 43 mm gradient in this patient with atrial fibrillation and calculated valve area of 1.72 cm. Mild pulmonary hypertension  2-D echocardiogram: - Left ventricle: The cavity size was normal. There was severe concentric hypertrophy. Systolic function was vigorous. The estimated ejection fraction was in the range of 65% to 70%. Wall motion was normal; there were no regional wall motion abnormalities. - Aortic valve: Cusp separation was reduced. There was severe stenosis. Peak velocity is 4.20m/s. Mean gradient (S): 41 mm Hg. - Left atrium: The atrium was mildly dilated. - Right ventricle: The cavity size was mildly dilated. Wall thickness was normal. - Right atrium: The atrium was mildly dilated.  Cultures: none  Antibiotics: none  Objective: Blood pressure 152/59, pulse 82, temperature 98.5 F (36.9 C), temperature source Oral, resp. rate 21, height 5\' 11"  (1.803 m), weight 240 lb 11.9 oz (109.2 kg), SpO2 95 %.  Intake/Output Summary (Last 24 hours) at 10/01/14 1257 Last data filed at 10/01/14 1000  Gross per 24 hour  Intake 1720.83 ml  Output   1700 ml  Net  20.83  ml     Exam: Gen: No acute respiratory distress-alert and denies chest pain Chest: Clear to auscultation bilaterally without wheezes, rhonchi or crackles, room air Cardiac: Regular occasionally bradycardic rate and rhythm, S1-S2, no rubs, holosystolic murmur. negative gallops, no peripheral edema, no JVD Abdomen: Soft nontender nondistended without obvious hepatosplenomegaly, no ascites Extremities: Symmetrical in appearance without cyanosis, clubbing or effusion  Scheduled Meds:  Scheduled Meds: . amLODipine  10 mg Oral Daily  . aspirin EC  81 mg Oral Daily  . atorvastatin  80 mg Oral q1800  . calcitRIOL  0.25 mcg Oral Daily  . heparin  5,000 Units Subcutaneous 3 times per day  . hydrALAZINE  50 mg Oral 4 times per day  . insulin aspart  0-9 Units Subcutaneous TID WC  . insulin detemir  20 Units Subcutaneous QHS  . sodium chloride  3 mL Intravenous Q12H   Continuous Infusions: . sodium chloride 125 mL/hr at 10/01/14 0537    Data Reviewed: Basic Metabolic Panel:  Recent Labs Lab 09/28/14 2115 09/29/14 0650 09/30/14 0801 10/01/14 0226  NA 136* 138 141 140  K 3.8 4.0 4.2 4.2  CL 96 102 106 107  CO2 17* 20 19 16*  GLUCOSE 308* 193* 146* 158*  BUN 77* 74* 76* 70*  CREATININE 5.15* 5.18* 5.67* 5.15*  CALCIUM 9.4 8.7 8.5 8.0*  MG  --   --  2.5  --    Liver Function Tests:  Recent Labs Lab 09/28/14 2115 09/29/14 0650 09/30/14 0801  AST 11 8 9   ALT 9 6 5   ALKPHOS 82 66 63  BILITOT 0.4 0.3 0.4  PROT 7.3 6.0 5.8*  ALBUMIN 3.6 2.8* 2.9*    Recent Labs Lab 09/28/14 2115  AMMONIA 24  CBC:  Recent Labs Lab 09/28/14 2115 09/29/14 0650 09/30/14 0801 10/01/14 0226  WBC 8.2 6.8 8.0 7.9  NEUTROABS  --   --  5.4  --   HGB 12.6* 10.8* 10.7* 11.0*  HCT 34.4* 29.5* 29.9* 32.5*  MCV 83.9 83.8 85.7 87.6  PLT 245 223 221 221   Cardiac Enzymes:  Recent Labs Lab 09/29/14 0650 09/29/14 1158 09/30/14 0105 09/30/14 0801 09/30/14 1940  TROPONINI <0.30 0.74*  1.46* 1.36* 1.08*   BNP (last 3 results) No results for input(s): PROBNP in the last 8760 hours. CBG:  Recent Labs Lab 09/30/14 1601 09/30/14 1654 09/30/14 2102 10/01/14 0751 10/01/14 1231  GLUCAP 133* 129* 190* 134* 161*    Recent Results (from the past 240 hour(s))  MRSA PCR Screening     Status: None   Collection Time: 09/29/14  1:17 PM  Result Value Ref Range Status   MRSA by PCR NEGATIVE NEGATIVE Final    Comment:        The GeneXpert MRSA Assay (FDA approved for NASAL specimens only), is one component of a comprehensive MRSA colonization surveillance program. It is not intended to diagnose MRSA infection nor to guide or monitor treatment for MRSA infections.      Studies:  Recent x-ray studies have been reviewed in detail by the Attending Physician  Time spent :      Erin Hearing, [place] [facility] Office  [phone] Pager [phone]   **If unable to reach the above provider after paging please contact the [place] @ [phone]  On-Call/Text Page:      [email or web address]      password [email or web address]  If 7PM-7AM, please contact night-coverage [email or web address] Password [email or web address] 10/01/2014, 12:57 PM   LOS: 3 days  I have directly reviewed the clinical findings, lab, imaging studies and management of this patient in detail. I have interviewed and examined the patient and agree with the documentation,  as recorded by the Physician extender.  Faye Ramsay M.D on 10/01/2014 at 8:49 PM [facility] Pager [phone]  If 7PM-7AM, please contact night-coverage [email or web address] Office  [phone]

## 2014-10-01 NOTE — Evaluation (Signed)
Physical Therapy Evaluation Patient Details Name: Jeffrey [name] MRN: [id number] DOB: 1941-08-04 Today's Date: 10/01/2014   History of Present Illness  Pt adm with chest pain and found to have NSTEMI. Pt developed 3rd degree heart block. Pt also has CKD and is scheduled for vein mapping in anticipation of initiation of HD soon. Past medical history for diabetes type 2, hypertension, hyperlipidemia, CAD, CKD-III,  history of stroke (without deficit  Clinical Impression  Pt admitted with above. Pt currently with functional limitations due to the deficits listed below (see PT Problem List).  Pt will benefit from skilled PT to increase their independence and safety with mobility to allow discharge home with family.    Follow Up Recommendations Home health PT;Supervision/Assistance - 24 hour    Equipment Recommendations  Rolling walker with 5" wheels    Recommendations for Other Services       Precautions / Restrictions Precautions Precautions: Fall Restrictions Weight Bearing Restrictions: No      Mobility  Bed Mobility               General bed mobility comments: Pt up in chair.  Transfers Overall transfer level: Needs assistance Equipment used: Rolling walker (2 wheeled) Transfers: Sit to/from Stand Sit to Stand: Min assist         General transfer comment: Verbal cues for hand placement and assist to bring hips up.  Ambulation/Gait Ambulation/Gait assistance: Min assist Ambulation Distance (Feet): 30 Feet Assistive device: Rolling walker (2 wheeled) Gait Pattern/deviations: Step-through pattern;Decreased step length - right;Decreased step length - left;Shuffle;Trunk flexed Gait velocity: decr Gait velocity interpretation: Below normal speed for age/gender General Gait Details: Verbal cues to stand more erect and to turn all the way to the chair before sitting.  Stairs            Wheelchair Mobility    Modified Rankin (Stroke Patients Only)        Balance Overall balance assessment: Needs assistance Sitting-balance support: No upper extremity supported;Feet supported Sitting balance-Leahy Scale: Fair     Standing balance support: Bilateral upper extremity supported Standing balance-Leahy Scale: Poor Standing balance comment: Walker and min A for static standing.                             Pertinent Vitals/Pain Pain Assessment: Faces Faces Pain Scale: Hurts little more Pain Location: rt knee Pain Intervention(s): Limited activity within patient's tolerance;Monitored during session;Repositioned    Home Living Family/patient expects to be discharged to:: Private residence Living Arrangements: Spouse/significant other;Children Available Help at Discharge: Family;Available 24 hours/day Type of Home: House Home Access: Stairs to enter Entrance Stairs-Rails: Right Entrance Stairs-Number of Steps: 5 Home Layout: One level Home Equipment: None      Prior Function Level of Independence: Independent               Hand Dominance        Extremity/Trunk Assessment   Upper Extremity Assessment: Defer to OT evaluation           Lower Extremity Assessment: Generalized weakness         Communication   Communication: No difficulties  Cognition Arousal/Alertness: Awake/alert Behavior During Therapy: WFL for tasks assessed/performed Overall Cognitive Status: Within Functional Limits for tasks assessed                      General Comments      Exercises  Assessment/Plan    PT Assessment Patient needs continued PT services  PT Diagnosis Difficulty walking;Generalized weakness   PT Problem List Decreased strength;Decreased activity tolerance;Decreased balance;Decreased mobility;Decreased knowledge of use of DME;Decreased knowledge of precautions;Obesity  PT Treatment Interventions DME instruction;Gait training;Stair training;Functional mobility training;Therapeutic  activities;Therapeutic exercise;Patient/family education;Balance training   PT Goals (Current goals can be found in the Care Plan section) Acute Rehab PT Goals Patient Stated Goal: Return home PT Goal Formulation: With patient/family Time For Goal Achievement: 10/08/14 Potential to Achieve Goals: Good    Frequency Min 3X/week   Barriers to discharge        Co-evaluation               End of Session Equipment Utilized During Treatment: Gait belt Activity Tolerance: Patient limited by fatigue Patient left: in chair;with call bell/phone within reach;with family/visitor present Nurse Communication: Mobility status         Time: [id number] PT Time Calculation (min): 17 min   Charges:   PT Evaluation $Initial PT Evaluation Tier I: 1 Procedure PT Treatments $Gait Training: 8-22 mins   PT G Codes:          Rayana Geurin 10/12/14, 11:48 AM  Suanne Marker PT [phone]
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
## 2014-10-01 NOTE — Plan of Care (Signed)
Problem: Phase II Progression Outcomes Goal: Anginal pain relieved Outcome: Completed/Met Date Met:  10/01/14 Goal: Cath/PCI Day Path if indicated Outcome: Completed/Met Date Met:  10/01/14 Goal: CV Risk Factors identified Outcome: Completed/Met Date Met:  10/01/14

## 2014-10-01 NOTE — Progress Notes (Addendum)
SUBJECTIVE:  No further chest pain or SOB.  No acute complaints.    OBJECTIVE:   Vitals:   Filed Vitals:   10/01/14 0400 10/01/14 0500 10/01/14 0600 10/01/14 0748  BP: 133/58 138/77 126/44 150/53  Pulse: 73 77 84 89  Temp:    98.4 F (36.9 C)  TempSrc:    Oral  Resp: 25 19 15 21   Height:      Weight:  240 lb 11.9 oz (109.2 kg)    SpO2: 94% 97% 97% 96%   I&O's:    Intake/Output Summary (Last 24 hours) at 10/01/14 1028 Last data filed at 10/01/14 1000  Gross per 24 hour  Intake 1720.83 ml  Output   2225 ml  Net -504.17 ml   TELEMETRY: Reviewed telemetry pt appears to be in a regular rhythm although difficult to visualize p waves  PHYSICAL EXAM General: Well developed, well nourished, in no acute distress Head:   Normal cephalic and atramatic  Lungs:  Clear bilaterally to auscultation. Heart:  RRR 3/6 systolic murmur over aortic area.   Abdomen: abdomen soft and non-tender Extremities:  No edema.   Neuro: Alert and oriented. Psych:  Normal affect, responds appropriately Skin: No rash   LABS: Basic Metabolic Panel:  Recent Labs  09/30/14 0801 10/01/14 0226  NA 141 140  K 4.2 4.2  CL 106 107  CO2 19 16*  GLUCOSE 146* 158*  BUN 76* 70*  CREATININE 5.67* 5.15*  CALCIUM 8.5 8.0*  MG 2.5  --    Liver Function Tests:  Recent Labs  09/29/14 0650 09/30/14 0801  AST 8 9  ALT 6 5  ALKPHOS 66 63  BILITOT 0.3 0.4  PROT 6.0 5.8*  ALBUMIN 2.8* 2.9*   No results for input(s): LIPASE, AMYLASE in the last 72 hours. CBC:  Recent Labs  09/30/14 0801 10/01/14 0226  WBC 8.0 7.9  NEUTROABS 5.4  --   HGB 10.7* 11.0*  HCT 29.9* 32.5*  MCV 85.7 87.6  PLT 221 221   Cardiac Enzymes:  Recent Labs  09/30/14 0105 09/30/14 0801 09/30/14 1940  TROPONINI 1.46* 1.36* 1.08*   BNP: Invalid input(s): POCBNP D-Dimer: No results for input(s): DDIMER in the last 72 hours. Hemoglobin A1C:  Recent Labs  09/29/14 0650  HGBA1C 6.8*   Fasting Lipid  Panel:  Recent Labs  09/29/14 0650  CHOL 120  HDL 32*  LDLCALC 56  TRIG 162*  CHOLHDL 3.8   Thyroid Function Tests: No results for input(s): TSH, T4TOTAL, T3FREE, THYROIDAB in the last 72 hours.  Invalid input(s): FREET3 Anemia Panel: No results for input(s): VITAMINB12, FOLATE, FERRITIN, TIBC, IRON, RETICCTPCT in the last 72 hours. Coag Panel:   Lab Results  Component Value Date   INR 1.12 09/29/2014    RADIOLOGY: Dg Chest 1 View  09/28/2014   CLINICAL DATA:  Left-sided arm and leg weakness. Memory loss. Diabetes. Mid chest pain. Code stroke.  EXAM: CHEST - 1 VIEW  COMPARISON:  08/06/2011  FINDINGS: Shallow inspiration. Mild cardiac enlargement with mild central pulmonary vascular congestion. No edema or consolidation. Atelectasis in the left lung base. Calcified and tortuous aorta. No pneumothorax.  IMPRESSION: Cardiac enlargement with mild pulmonary vascular congestion. No edema or consolidation. Atelectasis left lung base.   Electronically Signed   By: Lucienne Capers M.D.   On: 09/28/2014 21:54   Ct Head Wo Contrast  09/28/2014   CLINICAL DATA:  73 year old male with acute onset of left-sided arm and leg weakness, and memory  loss at 8 p.m. this evening.  EXAM: CT HEAD WITHOUT CONTRAST  TECHNIQUE: Contiguous axial images were obtained from the base of the skull through the vertex without intravenous contrast.  COMPARISON:  Head CT 09/02/2009.  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 Mild cerebral atrophy. Patchy and confluent areas of decreased attenuation are noted throughout the deep and periventricular white matter of the cerebral hemispheres bilaterally, compatible with chronic microvascular ischemic disease. Physiologic calcifications in the basal ganglia bilaterally. No acute intracranial abnormalities. Specifically, no evidence of acute intracranial hemorrhage, no definite findings of acute/subacute cerebral ischemia, no mass, mass effect, hydrocephalus  or abnormal intra or extra-axial fluid collections. Visualized paranasal sinuses and mastoids are well pneumatized. No acute displaced skull fractures are identified.  IMPRESSION: 1. No acute intracranial abnormalities. 2. 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   Electronically Signed   By: Vinnie Langton M.D.   On: 09/28/2014 21:56   US Renal  09/30/2014   CLINICAL DATA:  Elevated creatinine.  EXAM: RENAL/URINARY TRACT ULTRASOUND COMPLETE  COMPARISON:  Renal ultrasound 11/27/2005  FINDINGS: Right Kidney:  Length: 10.7 cm. The right renal cortex is echogenic without cortical thinning. Negative for right hydronephrosis.  Left Kidney:  Length: 10.7 cm. Left renal cortex may be echogenic but difficult to evaluate the echogenicity. There is a round anechoic structure in the left kidney mid pole which is exophytic. This structure measures up to 3.2 cm and most compatible with a cyst. Previously, this cyst measured up to 2.1 cm.  Bladder:  Bladder is decompressed with a Foley catheter.  IMPRESSION: Negative for hydronephrosis.  Renal parenchyma appears to be echogenic. Findings could be associated with medical renal disease but nonspecific.  Left renal cyst.   Electronically Signed   By: Markus Daft M.D.   On: 09/30/2014 00:36      ASSESSMENT/ PLAN:  NSTEMI/ Moderate Aortic Stenosis - Trop 0.74>1.46>1.36, echo shows severe aortic stenosis without evidence of LV dysfunction, subsequent cardiac cath shows occlusion of RCA with extensive collaterals, he does have 50-60% stenosis in the LAD and 30% in the Circumflex.  His Aortic Stenosis appears moderate from testing during cardiac cath.  No intervention needed at this time.  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  ? Afib on telemetry/cardiac cath - HR much improved, no need for pacemaker at  this time. - Rhythm appears regular, possibly junctional. Will repeat a 12 lead EKG.  CKD Stage 5: ACE and ARB have been held/discontinued.  Also holding Lasix as patient appears euvolemic. SCr slightly better this morning.  Will need to monitor renal function s/p cath. -Nephrology following  T2DM: On metformin, glipizide and levemir at home.  Metformin contraindicated in CKD5 and has been discontinued.   -Patient placed on SSI-S and Levemir 20 units -CBGs well controlled  HTN: Hypertensive this AM - Amlodipine 10mg  daily -Hydralazine 50mg  Q6 -Hydralazine prn - Home diuretics ACE and ARB and clonidine held.    Dispo: Stable from cardiac standpoint for transfer to floor, will need close monitoring of renal function post cath.  Lucious Groves, DO  10/01/2014  10:28 AM   I have examined the patient and reviewed assessment and plan and discussed with patient.  Agree with above as stated.  Watch renal function closely.  Improved today.  Moderate AS by cath.  Continue medical therapy.  Possibly ready for d/c tomorrow from cardiac standopint if renal function stable.  Dr. Melvia Heaps following.  Cardiology f/u with Dr. Claiborne Billings.  Eron Staat S.

## 2014-10-02 LAB — BASIC METABOLIC PANEL
Anion gap: 15 (ref 5–15)
BUN: 64 mg/dL — ABNORMAL HIGH (ref 6–23)
CO2: 16 meq/L — AB (ref 19–32)
Calcium: 8.1 mg/dL — ABNORMAL LOW (ref 8.4–10.5)
Chloride: 107 mEq/L (ref 96–112)
Creatinine, Ser: 5.37 mg/dL — ABNORMAL HIGH (ref 0.50–1.35)
GFR calc Af Amer: 11 mL/min — ABNORMAL LOW (ref >90)
GFR, EST NON AFRICAN AMERICAN: 9 mL/min — AB (ref >90)
GLUCOSE: 139 mg/dL — AB (ref 70–99)
POTASSIUM: 4.4 meq/L (ref 3.7–5.3)
Sodium: 138 mEq/L (ref 137–147)

## 2014-10-02 LAB — CBC
HCT: 29.8 % — ABNORMAL LOW (ref 39.0–52.0)
Hemoglobin: 10.3 g/dL — ABNORMAL LOW (ref 13.0–17.0)
MCH: 30.3 pg (ref 26.0–34.0)
MCHC: 34.6 g/dL (ref 30.0–36.0)
MCV: 87.6 fL (ref 78.0–100.0)
PLATELETS: 188 10*3/uL (ref 150–400)
RBC: 3.4 MIL/uL — AB (ref 4.22–5.81)
RDW: 13.4 % (ref 11.5–15.5)
WBC: 5.9 10*3/uL (ref 4.0–10.5)

## 2014-10-02 LAB — GLUCOSE, CAPILLARY
Glucose-Capillary: 141 mg/dL — ABNORMAL HIGH (ref 70–99)
Glucose-Capillary: 195 mg/dL — ABNORMAL HIGH (ref 70–99)

## 2014-10-02 MED ORDER — HYDRALAZINE HCL 50 MG PO TABS: 50.0000 mg | ORAL_TABLET | Freq: Four times a day (QID) | ORAL | Status: DC

## 2014-10-02 MED ORDER — SIMVASTATIN 20 MG PO TABS: 20.0000 mg | ORAL_TABLET | Freq: Every day | ORAL | Status: DC

## 2014-10-02 MED ORDER — FUROSEMIDE 80 MG PO TABS: 80.0000 mg | ORAL_TABLET | Freq: Two times a day (BID) | ORAL | Status: DC

## 2014-10-02 NOTE — Progress Notes (Signed)
Pt A&O x4; pt discharge education and instructions completed with pt and family at bedside. All voices understanding and denies any questions. Pt handed his prescription for zocor, hydralazine and lasix. Pt IV and telemetry removed; pt discharge home with spouse to transport him home. Pt transported off unit via wheelchair with belongings and family at side. Francis Gaines Zully Frane RN.

## 2014-10-02 NOTE — Progress Notes (Addendum)
Cardiac Rehab Phase I  Order received.  Pt has already ambulated with PT today.  Reviewed d/c education with pt and family: diet, exercise, MI book, when to call 911/MD, and restrictions.  Permission given to send info to [facility] for Phase II Cardiac Rehab.  Pt and family voiced understanding. Alberteen Sam, [place], ACSM RCEP [phone]

## 2014-10-02 NOTE — Discharge Summary (Signed)
Physician Discharge Summary  Jeffrey [name] [id number] DOB: 09/12/1941 DOA: 09/28/2014  PCP: No primary care provider on file.  Admit date: 09/28/2014 Discharge date: 10/02/2014  Recommendations for Outpatient Follow-up:  1. Pt will need to follow up with PCP in 2-3 weeks post discharge 2. Please obtain BMP to evaluate electrolytes and kidney function 3. Please also check CBC to evaluate Hg and Hct level 4. Please note that ACEI, ARB's now contraindicated due to worsening renal failure, both medications stopped 5. Also stopped metformin due to renal failure   Discharge Diagnoses:  Active Problems:   Chest pain   History of stroke   DM (diabetes mellitus), type 2   HLD (hyperlipidemia)   CAD (coronary artery disease)   CKD (chronic kidney disease), stage V   AV node dysfunction   Essential hypertension   Severe aortic stenosis   Elevated troponin   Diabetes type 2, controlled   Aortic stenosis   AKI (acute kidney injury)  Discharge Condition: Stable  Diet recommendation: Heart healthy diet discussed in details    Brief narrative: 73 year old male patient with known history of diabetes, hypertension dyslipidemia CAD and chronic kidney disease stage V. He presented to the emergency department because of chest pain. Pain was constant and very sharp and radiated to the left arm from the substernal area. Did not have any pleuritic qualities. No associated constitutional symptoms. According to the patient's wife at time of admission the patient was confused for about 2 hours after his chest pain started.  In the ER the patient's troponin was negative. He had a new right bundle branch blockon 12-lead EKG. He was hypertensive with a blood pressure 159/119. His last known creatinine from 2012 was 1.26. Creatinine this admission 5.15.  Since admission patient has developed sinus bradycardia with transient rates in the 40s and concerns for A-V dissociation and might require permanent  pacemaker. 2D echocardiogram completed 11/4 demonstrated normal LV function, severe aortic stenosis.Cardiologist at [facility] requested the patient transferred to [facility] for further evaluation for pacemaker.  The patient has been evaluated by nephrology who confirmed that since 2012 patient has experienced progression in his chronic kidney disease and his baseline creatinine is around 4.7. The patient has been scheduled for outpatient evaluation for permanent dialysis access.  After transfer to [facility] patient's troponins bumped. Subsequently cardiologist has now ordered cardiac catheterization in setting of chest pain with heart block and severe aortic stenosis. There are concerns patient may need valve replacement and bypass surgery. Given patient's underlying severe chronic kidney disease the cardiologist did discuss this with Dr. Jonnie Finner of Nephrology. Lasix has been discontinued and patient is being gently hydrated. Preadmission ARB and ACE inhibitor on hold.  Cardiac cath revealed primarily non obstructive CAD although did have significant disease RCA but fed by multiple collaterals. Aortic stenosis better clarified on cath as moderate so no surgery indicated. Paln at this point is to focus on medical therapy. Renal function has remained stable post cath and if remains stable in am 11/7 could possibly be ready to dc home. Plan is to have PT/OT evaluate pt.  Assessment/Plan: Active Problems:  Chest pain/elevated troponin In setting of heart block, chronic kidney disease-cardiac catheterization 11/5 with non obstructive CAD except for severe dz RCA (fed by collaterals)-appreciate cardiology assistance-continue aspirin, statin and nitrate. Pt is chest pain free this am and wants to go home.    AV node dysfunction Currently hemodynamically stable -heart rate improved with discontinuation of preadmission AV nodal blocking  agents-no indication for temporary pacing at this time-11/6    Severe aortic stenosis Had known moderate aortic stenosis documented several years prior -ECHO this admit with severe Aos but cath clarified as moderate   DM (diabetes mellitus), type 2 On Glucotrol and metformin and Levemir prior to admission-given low GFR and anticipated need for dialysis metformin contraindicated and Glucotrol has higher risk for symptomatic hypoglycemia in dialysis patients so both of these medications have been discontinued in favor of Levemir only-hemoglobin A1c 6.8-diabetes educator consulted and provided recommendations    CKD (chronic kidney disease), stage V Appreciate nephrology assistance--thus far renal function stable post cath-Nephro has decreased IVFs, pt will need to follow up with nephrologist. He was made aware.    Essential hypertension Moderate control-ARB and ACE inhibitor discontinued due to renal dysfunction   Physical Deconditioning PT/OT eval-HHPT recommended as well as RW with wheels so will complete face to face History of stroke HLD (hyperlipidemia)    Code Status: full Family Communication: Multiple family members at the bedside Disposition Plan/Expected LOS: Home   Consultants: Cardiology Nephrology  Procedures: Cardiac catheterization: Significant coronary obstructive disease with coronary calcification and total occlusion of the proximal to mid RCA with extensive left-to-right collaterals; 50 - 60% stenosis in the proximal LAD after the first diagonal branch, and 30% mid AV groove circumflex stenoses.Calcified aortic valve with a 43 mm gradient in this patient with atrial fibrillation and calculated valve area of 1.72 cm. Mild pulmonary hypertension  2-D echocardiogram: - Left ventricle: The cavity size was normal. There was severe concentric hypertrophy. Systolic function was vigorous. The estimated ejection fraction was in the range of 65% to 70%. Wall motion was normal; there were no regional wall  motion abnormalities. - Aortic valve: Cusp separation was reduced. There was severe stenosis. Peak velocity is 4.17m/s. Mean gradient (S): 41 mm Hg. - Left atrium: The atrium was mildly dilated. - Right ventricle: The cavity size was mildly dilated. Wall thickness was normal. - Right atrium: The atrium was mildly dilated.    Discharge Exam: Filed Vitals:   10/02/14 0512  BP: 143/43  Pulse: 84  Temp: 97.7 F (36.5 C)  Resp: 18   Filed Vitals:   10/01/14 1711 10/01/14 2057 10/02/14 0031 10/02/14 0512  BP: 145/58 132/71 147/59 143/43  Pulse: 84 79 84 84  Temp: 98.5 F (36.9 C) 98.3 F (36.8 C)  97.7 F (36.5 C)  TempSrc: Oral Oral  Oral  Resp: 20 18  18   Height:      Weight:    112.175 kg (247 lb 4.8 oz)  SpO2: 95% 94%  98%    General: Pt is alert, follows commands appropriately, not in acute distress Cardiovascular: Regular rate and rhythm, SEM 4/6, no rubs, no gallops Respiratory: Clear to auscultation bilaterally, no wheezing, no crackles, no rhonchi Abdominal: Soft, non tender, non distended, bowel sounds +, no guarding   Discharge Instructions  Discharge Instructions    Diet - low sodium heart healthy    Complete by:  As directed      Increase activity slowly    Complete by:  As directed             Medication List    STOP taking these medications        cloNIDine 0.1 MG tablet  Commonly known as:  CATAPRES     glipiZIDE 10 MG tablet  Commonly known as:  GLUCOTROL     lisinopril 40 MG tablet  Commonly known as:  PRINIVIL,ZESTRIL     losartan-hydrochlorothiazide 100-25 MG per tablet  Commonly known as:  HYZAAR     metFORMIN 1000 MG tablet  Commonly known as:  GLUCOPHAGE      TAKE these medications        amLODipine 10 MG tablet  Commonly known as:  NORVASC  Take 10 mg by mouth daily.     aspirin 81 MG chewable tablet  Chew 81 mg by mouth daily.     calcitRIOL 0.25 MCG capsule  Commonly known as:  ROCALTROL  Take 0.25 mcg by  mouth daily.     furosemide 80 MG tablet  Commonly known as:  LASIX  Take 1 tablet (80 mg total) by mouth 2 (two) times daily.     hydrALAZINE 50 MG tablet  Commonly known as:  APRESOLINE  Take 1 tablet (50 mg total) by mouth every 6 (six) hours.     Insulin Detemir 100 UNIT/ML Pen  Commonly known as:  LEVEMIR  Inject 35 Units into the skin at bedtime.     NITROSTAT 0.4 MG SL tablet  Generic drug:  nitroGLYCERIN  Take 0.4 mg by mouth as needed.     simvastatin 20 MG tablet  Commonly known as:  ZOCOR  Take 1 tablet (20 mg total) by mouth at bedtime.            Follow-up Information    Follow up with [facility].   Contact information:   [place] [id number] [phone]       Follow up with Faye Ramsay, MD.   Specialty:  Internal Medicine   Why:  As needed, If symptoms worsen   Contact information:   [address] [place] [address] [phone]        The results of significant diagnostics from this hospitalization (including imaging, microbiology, ancillary and laboratory) are listed below for reference.     Microbiology: Recent Results (from the past 240 hour(s))  MRSA PCR Screening     Status: None   Collection Time: 09/29/14  1:17 PM  Result Value Ref Range Status   MRSA by PCR NEGATIVE NEGATIVE Final    Comment:        The GeneXpert MRSA Assay (FDA approved for NASAL specimens only), is one component of a comprehensive MRSA colonization surveillance program. It is not intended to diagnose MRSA infection nor to guide or monitor treatment for MRSA infections.      Labs: Basic Metabolic Panel:  Recent Labs Lab 09/28/14 2115 09/29/14 0650 09/30/14 0801 10/01/14 0226 10/02/14 0430  NA 136* 138 141 140 138  K 3.8 4.0 4.2 4.2 4.4  CL 96 102 106 107 107  CO2 17* 20 19 16* 16*  GLUCOSE 308* 193* 146* 158* 139*  BUN 77* 74* 76* 70* 64*  CREATININE 5.15* 5.18* 5.67* 5.15* 5.37*  CALCIUM 9.4 8.7  8.5 8.0* 8.1*  MG  --   --  2.5  --   --    Liver Function Tests:  Recent Labs Lab 09/28/14 2115 09/29/14 0650 09/30/14 0801  AST 11 8 9   ALT 9 6 5   ALKPHOS 82 66 63  BILITOT 0.4 0.3 0.4  PROT 7.3 6.0 5.8*  ALBUMIN 3.6 2.8* 2.9*    Recent Labs Lab 09/28/14 2115  AMMONIA 24   CBC:  Recent Labs Lab 09/28/14 2115 09/29/14 0650 09/30/14 0801 10/01/14 0226 10/02/14 0430  WBC 8.2 6.8 8.0 7.9 5.9  NEUTROABS  --   --  5.4  --   --   HGB 12.6* 10.8* 10.7* 11.0* 10.3*  HCT 34.4* 29.5* 29.9* 32.5* 29.8*  MCV 83.9 83.8 85.7 87.6 87.6  PLT 245 223 221 221 188   Cardiac Enzymes:  Recent Labs Lab 09/29/14 0650 09/29/14 1158 09/30/14 0105 09/30/14 0801 09/30/14 1940  TROPONINI <0.30 0.74* 1.46* 1.36* 1.08*   CBG:  Recent Labs Lab 10/01/14 0751 10/01/14 1231 10/01/14 1721 10/01/14 2053 10/02/14 0617  GLUCAP 134* 161* 179* 178* 141*   SIGNED: Time coordinating discharge: Over 30 minutes  Faye Ramsay, MD  [facility] 10/02/2014, 10:27 AM Pager [phone]  If 7PM-7AM, please contact night-coverage [email or web address] Password [email or web address]

## 2014-10-02 NOTE — Plan of Care (Signed)
Problem: Phase III Progression Outcomes Goal: No anginal pain Outcome: Completed/Met Date Met:  10/02/14 Goal: Vascular site scale level 0 - I Vascular Site Scale Level 0: No bruising/bleeding/hematoma Level I (Mild): Bruising/Ecchymosis, minimal bleeding/ooozing, palpable hematoma < 3 cm Level II (Moderate): Bleeding not affecting hemodynamic parameters, pseudoaneurysm, palpable hematoma > 3 cm Level III (Severe) Bleeding which affects hemodynamic parameters or retroperitoneal hemorrhage  Outcome: Completed/Met Date Met:  10/02/14

## 2014-10-02 NOTE — Discharge Instructions (Signed)
Aortic Stenosis Aortic stenosis is a narrowing of the aortic valve. The aortic valve is a gatelike structure that is located between the lower left chamber of the heart (left ventricle) and the blood vessel that leads away from the heart (aorta). When the aortic valve is narrowed, it does not open all the way. This makes it hard for the heart to pump blood into the aorta and causes the heart to work harder. The extra work can weaken the heart over time and lead to heart failure. CAUSES  Causes of aortic stenosis include:  Calcium deposits on the aortic valve that have made the valve stiff. This condition generally affects those over the age of 30. It is the most common cause of aortic stenosis.  A birth defect.  Rheumatic fever. This is a problem that may occur after a strep throat infection that was not treated adequately. Rheumatic fever can cause permanent damage to heart valves. SIGNS AND SYMPTOMS  People with aortic stenosis usually have no symptoms until the condition becomes severe. It may take 10-20 years for mild or moderate aortic stenosis to become severe. Symptoms may include:   Shortness of breath, especially with physical activity.   Feeling weak and tired (fatigued) or getting tired easily.  Chest discomfort (angina). This may occur with minimal activity if the aortic stenosis is severe.  An irregular or faster-than-normal heartbeat.  Dizziness or fainting that happens with exertion or after taking certain heart medicines (such as nitroglycerin). DIAGNOSIS  Aortic stenosis is usually diagnosed with a physical exam and with a type of imaging test called echocardiography. During echocardiography, sound waves are used to evaluate how blood flows through the heart. If your health care provider suspects aortic stenosis but the test does not clearly show it, a procedure called cardiac catheterization may be done to diagnose the condition. Tests may also be done to evaluate heart  function. They may include:  Electrocardiography. During this test, the electrical impulses of the heart are recorded while you are lying down and sticky patches are placed on your chest, arms, and legs.  Stress tests. There is more than one type of stress test. If a stress test is needed, ask your health care provider about which type is best for you.  Blood tests. TREATMENT  Treatment depends on how severe the aortic stenosis is, your symptoms, and the problems it is causing.   Observation. If the aortic stenosis is mild, no treatment may be needed. However, you will need to have the condition checked regularly to make sure it is not getting worse or causing serious problems.  Surgery. Surgery to repair or replace the aortic valve is the most common treatment for aortic stenosis. Several types of surgeries are available. The most common are open-heart surgery and transcutaneous aortic valve replacement (TAVR). TAVR does not require that the chest be opened. It is usually performed on elderly patients and those who are not able to have open-heart surgery.  Medicines. Medicines may be given to keep symptoms from getting worse. Medicines cannot reverse aortic stenosis. HOME CARE INSTRUCTIONS   You may need to avoid certain types of physical activity. If your aortic stenosis is mild, you may need to avoid only strenuous activity. The more severe your aortic stenosis, the more activities you will need to avoid. Talk with your health care provider about the types of activity you should avoid.  Take medicines only as directed by your health care provider.  If you are a woman with  aortic valve stenosis and want to get pregnant, talk to your health care provider before you become pregnant.  If you are a woman with aortic valve stenosis and are pregnant, keep all follow-up visits with all recommended health care providers.  Keep all follow-up visits for tests, exams, and treatments as directed by  your health care provider. SEEK IMMEDIATE MEDICAL CARE IF:  You develop chest pain or tightness.   You develop shortness of breath or difficulty breathing.   You develop light-headedness or faint.   It feels like your heartbeat is irregular or faster than normal.  You have a fever. Document Released: 08/11/2003 Document Revised: 03/29/2014 Document Reviewed: 11/06/2012 [facility] Patient Information 2015 [place], [place]. This information is not intended to replace advice given to you by your health care provider. Make sure you discuss any questions you have with your health care provider. Chronic Kidney Disease Chronic kidney disease occurs when the kidneys are damaged over a long period. The kidneys are two organs that lie on either side of the spine between the middle of the back and the front of the abdomen. The kidneys:   Remove wastes and extra water from the blood.   Produce important hormones. These help keep bones strong, regulate blood pressure, and help create red blood cells.   Balance the fluids and chemicals in the blood and tissues. A small amount of kidney damage may not cause problems, but a large amount of damage may make it difficult or impossible for the kidneys to work the way they should. If steps are not taken to slow down the kidney damage or stop it from getting worse, the kidneys may stop working permanently. Most of the time, chronic kidney disease does not go away. However, it can often be controlled, and those with the disease can usually live normal lives. CAUSES  The most common causes of chronic kidney disease are diabetes and high blood pressure (hypertension). Chronic kidney disease may also be caused by:   Diseases that cause the kidneys' filters to become inflamed.   Diseases that affect the immune system.   Genetic diseases.   Medicines that damage the kidneys, such as anti-inflammatory medicines.  Poisoning or exposure to toxic  substances.   A reoccurring kidney or urinary infection.   A problem with urine flow. This may be caused by:   Cancer.   Kidney stones.   An enlarged prostate in males. SIGNS AND SYMPTOMS  Because the kidney damage in chronic kidney disease occurs slowly, symptoms develop slowly and may not be obvious until the kidney damage becomes severe. A person may have a kidney disease for years without showing any symptoms. Symptoms can include:   Swelling (edema) of the legs, ankles, or feet.   Tiredness (lethargy).   Nausea or vomiting.   Confusion.   Problems with urination, such as:   Decreased urine production.   Frequent urination, especially at night.   Frequent accidents in children who are potty trained.   Muscle twitches and cramps.   Shortness of breath.  Weakness.   Persistent itchiness.   Loss of appetite.  Metallic taste in the mouth.  Trouble sleeping.  Slowed development in children.  Short stature in children. DIAGNOSIS  Chronic kidney disease may be detected and diagnosed by tests, including blood, urine, imaging, or kidney biopsy tests.  TREATMENT  Most chronic kidney diseases cannot be cured. Treatment usually involves relieving symptoms and preventing or slowing the progression of the disease. Treatment may include:  A special diet. You may need to avoid alcohol and foods thatare salty and high in potassium.   Medicines. These may:   Lower blood pressure.   Relieve anemia.   Relieve swelling.   Protect the bones. HOME CARE INSTRUCTIONS   Follow your prescribed diet.   Take medicines only as directed by your health care provider. Do not take any new medicines (prescription, over-the-counter, or nutritional supplements) unless approved by your health care provider. Many medicines can worsen your kidney damage or need to have the dose adjusted.   Quit smoking if you smoke. Talk to your health care provider about a  smoking cessation program.   Keep all follow-up visits as directed by your health care provider. SEEK IMMEDIATE MEDICAL CARE IF:  Your symptoms get worse or you develop new symptoms.   You develop symptoms of end-stage kidney disease. These include:   Headaches.   Abnormally dark or light skin.   Numbness in the hands or feet.   Easy bruising.   Frequent hiccups.   Menstruation stops.   You have a fever.   You have decreased urine production.   You havepain or bleeding when urinating. MAKE SURE YOU:  Understand these instructions.  Will watch your condition.  Will get help right away if you are not doing well or get worse. FOR MORE INFORMATION   American Association of Kidney Patients: BombTimer.gl  National Kidney Foundation: www.kidney.[place]: https://mathis.com/  [facility]: www.lifeoptions.org and www.kidneyschool.org Document Released: 08/21/2008 Document Revised: 03/29/2014 Document Reviewed: 07/11/2012 [facility] Patient Information 2015 [place], [place]. This information is not intended to replace advice given to you by your health care provider. Make sure you discuss any questions you have with your health care provider.

## 2014-10-02 NOTE — Progress Notes (Signed)
Patient ID: CHAISE GOLDSBORO, male   DOB: 19-Apr-1941, 73 y.o.   MRN: [id number]   SUBJECTIVE:  No further chest pain or SOB.  No acute complaints.    OBJECTIVE:   Vitals:   Filed Vitals:   10/01/14 1711 10/01/14 2057 10/02/14 0031 10/02/14 0512  BP: 145/58 132/71 147/59 143/43  Pulse: 84 79 84 84  Temp: 98.5 F (36.9 C) 98.3 F (36.8 C)  97.7 F (36.5 C)  TempSrc: Oral Oral  Oral  Resp: 20 18  18   Height:      Weight:    112.175 kg (247 lb 4.8 oz)  SpO2: 95% 94%  98%   I&O's:    Intake/Output Summary (Last 24 hours) at 10/02/14 1112 Last data filed at 10/02/14 0730  Gross per 24 hour  Intake   1350 ml  Output   1525 ml  Net   -175 ml   TELEMETRY: NSR no heat block   PHYSICAL EXAM General: Well developed, well nourished, in no acute distress Head:   Normal cephalic and atramatic  Lungs:  Clear bilaterally to auscultation. Heart:  RRR 3/6 systolic murmur over aortic area.   Abdomen: abdomen soft and non-tender Extremities:  No edema.   Neuro: Alert and oriented. Psych:  Normal affect, responds appropriately Skin: No rash RFA/RFV cath sight A no hematoma    LABS: Basic Metabolic Panel:  Recent Labs  09/30/14 0801 10/01/14 0226 10/02/14 0430  NA 141 140 138  K 4.2 4.2 4.4  CL 106 107 107  CO2 19 16* 16*  GLUCOSE 146* 158* 139*  BUN 76* 70* 64*  CREATININE 5.67* 5.15* 5.37*  CALCIUM 8.5 8.0* 8.1*  MG 2.5  --   --    Liver Function Tests:  Recent Labs  09/30/14 0801  AST 9  ALT 5  ALKPHOS 63  BILITOT 0.4  PROT 5.8*  ALBUMIN 2.9*   No results for input(s): LIPASE, AMYLASE in the last 72 hours. CBC:  Recent Labs  09/30/14 0801 10/01/14 0226 10/02/14 0430  WBC 8.0 7.9 5.9  NEUTROABS 5.4  --   --   HGB 10.7* 11.0* 10.3*  HCT 29.9* 32.5* 29.8*  MCV 85.7 87.6 87.6  PLT 221 221 188   Cardiac Enzymes:  Recent Labs  09/30/14 0105 09/30/14 0801 09/30/14 1940  TROPONINI 1.46* 1.36* 1.08*   Coag Panel:   Lab Results  Component Value Date    INR 1.12 09/29/2014    RADIOLOGY: Dg Chest 1 View  09/28/2014   CLINICAL DATA:  Left-sided arm and leg weakness. Memory loss. Diabetes. Mid chest pain. Code stroke.  EXAM: CHEST - 1 VIEW  COMPARISON:  08/06/2011  FINDINGS: Shallow inspiration. Mild cardiac enlargement with mild central pulmonary vascular congestion. No edema or consolidation. Atelectasis in the left lung base. Calcified and tortuous aorta. No pneumothorax.  IMPRESSION: Cardiac enlargement with mild pulmonary vascular congestion. No edema or consolidation. Atelectasis left lung base.   Electronically Signed   By: Lucienne Capers M.D.   On: 09/28/2014 21:54   Ct Head Wo Contrast  09/28/2014   CLINICAL DATA:  73 year old male with acute onset of left-sided arm and leg weakness, and memory loss at 8 p.m. this evening.  EXAM: CT HEAD WITHOUT CONTRAST  TECHNIQUE: Contiguous axial images were obtained from the base of the skull through the vertex without intravenous contrast.  COMPARISON:  Head CT 09/02/2009.  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 Mild cerebral atrophy. Patchy and  confluent areas of decreased attenuation are noted throughout the deep and periventricular white matter of the cerebral hemispheres bilaterally, compatible with chronic microvascular ischemic disease. Physiologic calcifications in the basal ganglia bilaterally. No acute intracranial abnormalities. Specifically, no evidence of acute intracranial hemorrhage, no definite findings of acute/subacute cerebral ischemia, no mass, mass effect, hydrocephalus or abnormal intra or extra-axial fluid collections. Visualized paranasal sinuses and mastoids are well pneumatized. No acute displaced skull fractures are identified.  IMPRESSION: 1. No acute intracranial abnormalities. 2. 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   Electronically  Signed   By: Vinnie Langton M.D.   On: 09/28/2014 21:56   US Renal  09/30/2014   CLINICAL DATA:  Elevated creatinine.  EXAM: RENAL/URINARY TRACT ULTRASOUND COMPLETE  COMPARISON:  Renal ultrasound 11/27/2005  FINDINGS: Right Kidney:  Length: 10.7 cm. The right renal cortex is echogenic without cortical thinning. Negative for right hydronephrosis.  Left Kidney:  Length: 10.7 cm. Left renal cortex may be echogenic but difficult to evaluate the echogenicity. There is a round anechoic structure in the left kidney mid pole which is exophytic. This structure measures up to 3.2 cm and most compatible with a cyst. Previously, this cyst measured up to 2.1 cm.  Bladder:  Bladder is decompressed with a Foley catheter.  IMPRESSION: Negative for hydronephrosis.  Renal parenchyma appears to be echogenic. Findings could be associated with medical renal disease but nonspecific.  Left renal cyst.   Electronically Signed   By: Markus Daft M.D.   On: 09/30/2014 00:36      ASSESSMENT/ PLAN:  Mr. Dona developed advanced heart block during this admission which may be due to total occlusion of his RCA and beta blocker therapy. This has subtotally resolved with discontinuance of beta blocker and clonidine. 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 He is followed by the renal service and may need initiation of dialysis. He has aortic stenosis with a peak gradient at catheterization of 43 mm in the setting of atrial fibrillation and his valve area of 1.72 does not suggest severe AS.  Ok to d/c home will need vascular consult for fistula placement  Cath sight looks fine with no hematoma  Jenkins Rouge

## 2014-10-02 NOTE — Progress Notes (Signed)
Physical Therapy Treatment Patient Details Name: Jeffrey [name] MRN: [id number] DOB: [birth year] Today's Date: 10/02/2014    History of Present Illness Pt adm with chest pain and found to have NSTEMI. Pt developed 3rd degree heart block. Pt also has CKD and is scheduled for vein mapping in anticipation of initiation of HD soon. Past medical history for diabetes type 2, hypertension, hyperlipidemia, CAD, CKD-III,  history of stroke (without deficit    PT Comments    Pt needs encouragement for mobility, but was able to increase ambulation distance.  Pt will continue to need 24hr support at home as he is having difficulty coming to stand without A.  Will continue to follow.    Follow Up Recommendations  Home health PT;Supervision/Assistance - 24 hour     Equipment Recommendations  Rolling walker with 5" wheels    Recommendations for Other Services       Precautions / Restrictions Precautions Precautions: Fall Restrictions Weight Bearing Restrictions: No    Mobility  Bed Mobility Overal bed mobility: Needs Assistance Bed Mobility: Supine to Sit     Supine to sit: Min assist     General bed mobility comments: A to bring trunk up to sitting.    Transfers Overall transfer level: Needs assistance Equipment used: Rolling walker (2 wheeled) Transfers: Sit to/from Stand Sit to Stand: Min assist         General transfer comment: cues for UE use as pt tends to pull on RW.    Ambulation/Gait Ambulation/Gait assistance: Min guard Ambulation Distance (Feet): 100 Feet Assistive device: Rolling walker (2 wheeled) Gait Pattern/deviations: Step-through pattern;Decreased stride length;Trunk flexed     General Gait Details: cues for upright posture and encouragement for increased distance as pt tends to self-limit.     Stairs            Wheelchair Mobility    Modified Rankin (Stroke Patients Only)       Balance Overall balance assessment: Needs  assistance Sitting-balance support: No upper extremity supported;Feet supported Sitting balance-Leahy Scale: Fair     Standing balance support: Bilateral upper extremity supported Standing balance-Leahy Scale: Poor Standing balance comment: leans heavily on RW for balance.                      Cognition Arousal/Alertness: Awake/alert Behavior During Therapy: WFL for tasks assessed/performed Overall Cognitive Status: Within Functional Limits for tasks assessed                      Exercises      General Comments        Pertinent Vitals/Pain Pain Assessment: No/denies pain    Home Living                      Prior Function            PT Goals (current goals can now be found in the care plan section) Acute Rehab PT Goals Patient Stated Goal: to go home PT Goal Formulation: With patient/family Time For Goal Achievement: 10/08/14 Potential to Achieve Goals: Good Progress towards PT goals: Progressing toward goals    Frequency  Min 3X/week    PT Plan Current plan remains appropriate    Co-evaluation             End of Session Equipment Utilized During Treatment: Gait belt Activity Tolerance: Patient limited by fatigue Patient left: in bed;with call bell/phone within reach;with family/visitor present (Sitting EOB)  Time: [id number] PT Time Calculation (min): 24 min  Charges:  $Gait Training: 23-37 mins                    G CodesCatarina Hartshorn, [employer] 10/02/2014, 8:59 AM

## 2014-10-02 NOTE — Progress Notes (Signed)
During discharge education; pt and spouse informs RN pt no longer take his glipizide since he's started on Levemir; also needed prescription for zocor; MD paged and notified. MD to make necessary changes and update AVS. P. Amo Merrianne Mccumbers RN.

## 2014-10-02 NOTE — Progress Notes (Signed)
  [facility] Progress Note   Subjective: no complaints, has voided well after foley removed, creat 5.3 today  Filed Vitals:   10/01/14 1711 10/01/14 2057 10/02/14 0031 10/02/14 0512  BP: 145/58 132/71 147/59 143/43  Pulse: 84 79 84 84  Temp: 98.5 F (36.9 C) 98.3 F (36.8 C)  97.7 F (36.5 C)  TempSrc: Oral Oral  Oral  Resp: 20 18  18   Height:      Weight:    112.175 kg (247 lb 4.8 oz)  SpO2: 95% 94%  98%   Exam: Older adult male No rash, cyanosis or gangrene Chest clear bilat RRR 3/6 blowing syst murmur Abd soft, NTND No LE or UE edema Neuro is Ox 3 and nonfocal   Creat Oct 12th 2015 was 4.7 UA prot > 300, 5.5, 1.013, clear, neg nit/LE, 0-2 rbc/wbc, +gran cast CXR vasc congestion w/o edema.   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 ARB/acei on hold. Vol status is good.  7 Hx of CVA  Plan - stopped IVF's, will sign off.  Would cont to hold acei/ARB for now, can be resumed as needed in OP setting. Would resume po lasix as discharge (80 bid).     Kelly Splinter MD  pager [phone]    cell [phone]  10/02/2014, 10:13 AM     Recent Labs Lab 09/30/14 0801 10/01/14 0226 10/02/14 0430  NA 141 140 138  K 4.2 4.2 4.4  CL 106 107 107  CO2 19 16* 16*  GLUCOSE 146* 158* 139*  BUN 76* 70* 64*  CREATININE 5.67* 5.15* 5.37*  CALCIUM 8.5 8.0* 8.1*    Recent Labs Lab 09/28/14 2115 09/29/14 0650 09/30/14 0801  AST 11 8 9   ALT 9 6 5   ALKPHOS 82 66 63  BILITOT 0.4 0.3 0.4  PROT 7.3 6.0 5.8*  ALBUMIN 3.6 2.8* 2.9*    Recent Labs Lab 09/30/14 0801 10/01/14 0226 10/02/14 0430  WBC 8.0 7.9 5.9  NEUTROABS 5.4  --   --   HGB 10.7* 11.0* 10.3*  HCT 29.9* 32.5* 29.8*  MCV 85.7 87.6 87.6  PLT 221 221 188   . amLODipine   10 mg Oral Daily  . aspirin EC  81 mg Oral Daily  . atorvastatin  80 mg Oral q1800  . calcitRIOL  0.25 mcg Oral Daily  . heparin  5,000 Units Subcutaneous 3 times per day  . hydrALAZINE  50 mg Oral 4 times per day  . insulin aspart  0-9 Units Subcutaneous TID WC  . insulin detemir  20 Units Subcutaneous QHS  . sodium chloride  3 mL Intravenous Q12H   . sodium chloride 75 mL/hr at 10/01/14 2206   sodium chloride, acetaminophen, hydrALAZINE, morphine injection, nitroGLYCERIN, ondansetron (ZOFRAN) IV, ondansetron (ZOFRAN) IV, sodium chloride

## 2014-10-03 ENCOUNTER — Telehealth: Payer: Self-pay | Admitting: Physician Assistant

## 2014-10-03 NOTE — Telephone Encounter (Signed)
Family members had requested we contact the the patient/wife because he seemed to be doing poorly today. I contacted the wife and he has been having diarrhea, by mouth intake is poor. He has only had about 1.5 glasses of ginger ale today and ate almost nothing.  I reviewed the medications with her and the only new medication is hydralazine.   She does not think he is hypotensive and prior to the diarrhea today, he was feeling pretty well. She is compliant with the medication changes made at discharge, holding the metformin among others.  He is currently on Lasix 80 mg twice a day, but has not had his p.m. Dose.  Advised her that if his diarrhea did not improve, he could become dehydrated. I suggested she hold his Lasix tonight and restart it tomorrow if he was feeling better. I advised that he needed to drink about a liter of liquid daily. I advised her that if his symptoms did not improve, she should bring him to the emergency room or call his family physician. He is not currently having any cardiac issues.  She had not weight him today and did not know what his weight was. I encouraged her to get daily weights and continue to monitor him closely.

## 2014-10-03 NOTE — Care Management Note (Signed)
    Page 1 of 2   10/03/2014     8:40:11 AM CARE MANAGEMENT NOTE 10/03/2014  Patient:  SAIGE, BUSBY   Account Number:  [id number]  Date Initiated:  10/03/2014  Documentation initiated by:  [facility]  Subjective/Objective Assessment:   adm: chest pain or SOB     Action/Plan:   discharge planning   Anticipated DC Date:  10/02/2014   Anticipated DC Plan:  [place]  CM consult      [facility] Choice  HOME HEALTH   Choice offered to / List presented to:  C-1 Patient   DME arranged  [place]      DME agency  [place] arranged  [place].   Status of service:  Completed, signed off Medicare Important Message given?   (If response is "NO", the following Medicare IM given date fields will be blank) Date Medicare IM given:   Medicare IM given by:   Date Additional Medicare IM given:   Additional Medicare IM given by:    Discharge Disposition:  [place]  Per UR Regulation:    If discussed at Long Length of Stay Meetings, dates discussed:    Comments:  10/02/14 12:05 Cm met with pt in room to offer choice of home health agency.  Pt chooses [facility] to [facility].  Address and contact informaiton verified with pt.  DME rep called to please deliver rollator to room prior to discharge. Referral called to [facility] rep, Miranda.  No other CM needs were communicated.  Mariane Masters, BSN, CM [phone].

## 2014-10-07 ENCOUNTER — Encounter: Payer: Self-pay | Admitting: Vascular Surgery

## 2014-10-08 ENCOUNTER — Ambulatory Visit (INDEPENDENT_AMBULATORY_CARE_PROVIDER_SITE_OTHER): Payer: Medicare Other | Admitting: Vascular Surgery

## 2014-10-08 ENCOUNTER — Other Ambulatory Visit: Payer: Self-pay

## 2014-10-08 ENCOUNTER — Encounter: Payer: Self-pay | Admitting: Vascular Surgery

## 2014-10-08 ENCOUNTER — Ambulatory Visit (HOSPITAL_COMMUNITY)
Admission: RE | Admit: 2014-10-08 | Discharge: 2014-10-08 | Disposition: A | Discharge status: Home / Self Care | Payer: Medicare Other | Source: Ambulatory Visit | Attending: Vascular Surgery | Admitting: Vascular Surgery

## 2014-10-08 ENCOUNTER — Ambulatory Visit (INDEPENDENT_AMBULATORY_CARE_PROVIDER_SITE_OTHER)
Admission: RE | Admit: 2014-10-08 | Discharge: 2014-10-08 | Disposition: A | Discharge status: Home / Self Care | Payer: Medicare Other | Source: Ambulatory Visit | Attending: Vascular Surgery | Admitting: Vascular Surgery

## 2014-10-08 VITALS — BP 149/52 | HR 60 | Ht 71.0 in | Wt 242.0 lb

## 2014-10-08 DIAGNOSIS — Z01810 Encounter for preprocedural cardiovascular examination: Secondary | ICD-10-CM

## 2014-10-08 DIAGNOSIS — N184 Chronic kidney disease, stage 4 (severe): Secondary | ICD-10-CM | POA: Diagnosis not present

## 2014-10-08 DIAGNOSIS — N185 Chronic kidney disease, stage 5: Secondary | ICD-10-CM

## 2014-10-08 NOTE — Progress Notes (Signed)
New Access Referred by:  Windy Kalata, MD [address] [place], [place] [address]  Reason for referral: New access  History of Present Illness  Jeffrey [name] is a 73 y.o. (22-Oct-1941) male who presents for evaluation for permanent access.  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 The patient is right hand dominant.  The patient has not had previous access procedures.   Of note, he was recently discharged from [facility] on 11/7 after being admitted for chest pain. His cardiac catheterization on 11/5 revealed non obstructive CAD, except for severe disease of the RCA with extensive left-to-right collaterals. His 2D ECHO revealed an EF of 65-70% and severe aortic stenosis. He also had new left sided weakness while hospitalized, but his CT scan was negative for acute stroke. He had a carotid duplex study performed yesterday at [facility].   He has hypertension managed on two antihypertensives. He has insulin dependent diabetes. He has hyperlipidemia managed on a statin. He takes a daily aspirin.   Past Medical History  Diagnosis Date  . Diabetes mellitus   . Hyperlipidemia   . Hypertension   . Chronic back pain   . Renal disorder   . Aortic stenosis   . Anemia   . Atrial fibrillation   . Myocardial infarction   . Peripheral vascular disease     Past Surgical History  Procedure Laterality Date  . Cholecystectomy    . Esophagogastroduodenoscopy  08/01/2012    Procedure: ESOPHAGOGASTRODUODENOSCOPY (EGD);  Surgeon: [name], MD;  Location: [facility];  Service: Endoscopy;  Laterality: N/A;    History   Social History  . Marital Status: Married    Spouse Name: N/A    Number of Children: N/A  . Years of Education: N/A   Occupational History  . Not on file.   Social History Main Topics  . Smoking status: Former Smoker -- 2.00 packs/day for 32 years    Quit date: 11/27/1983  . Smokeless tobacco: Not on file  . Alcohol Use: No     Comment: occ  . Drug Use: No  .  Sexual Activity: Not on file   Other Topics Concern  . Not on file   Social History Narrative    Family History  Problem Relation Age of Onset  . Diabetes Father   . Heart disease Father   . Diabetes Sister     Current Outpatient Prescriptions on File Prior to Visit  Medication Sig Dispense Refill  . amLODipine (NORVASC) 10 MG tablet Take 10 mg by mouth daily.      [name] aspirin 81 MG chewable tablet Chew 81 mg by mouth daily.    . calcitRIOL (ROCALTROL) 0.25 MCG capsule Take 0.25 mcg by mouth daily.  6  . furosemide (LASIX) 80 MG tablet Take 1 tablet (80 mg total) by mouth 2 (two) times daily. 60 tablet 1  . hydrALAZINE (APRESOLINE) 50 MG tablet Take 1 tablet (50 mg total) by mouth every 6 (six) hours. 120 tablet 1  . Insulin Detemir (LEVEMIR) 100 UNIT/ML Pen Inject 35 Units into the skin at bedtime.    . simvastatin (ZOCOR) 20 MG tablet Take 1 tablet (20 mg total) by mouth at bedtime. 30 tablet 1  . nitroGLYCERIN (NITROSTAT) 0.4 MG SL tablet Take 0.4 mg by mouth as needed.     No current facility-administered medications on file prior to visit.    Allergies  Allergen Reactions  . Byetta 10 Mcg Pen [Exenatide] Nausea And Vomiting  REVIEW OF SYSTEMS:  (Positives checked otherwise negative)  CARDIOVASCULAR:  [x]  previous chest pain, [x]  chest pressure, []  palpitations, [x]  shortness of breath when laying flat, [x]  shortness of breath with exertion,  [x]  pain in feet when walking, []  pain in feet when laying flat, []  history of blood clot in veins (DVT), []  history of phlebitis, [x]  swelling in legs, []  varicose veins  PULMONARY:  []  productive cough, []  asthma, []  wheezing  NEUROLOGIC:  [x]  weakness in arms or legs, [x]  numbness in arms or legs, []  difficulty speaking or slurred speech, [x]  temporary loss of vision in one eye, []  dizziness  HEMATOLOGIC:  []  bleeding problems, []  problems with blood clotting too easily  MUSCULOSKEL:  []  joint pain, []  joint  swelling  GASTROINTEST:  []  vomiting blood, []  blood in stool     GENITOURINARY:  []  burning with urination, []  blood in urine  PSYCHIATRIC:  []  history of major depression  INTEGUMENTARY:  []  rashes, []  ulcers  CONSTITUTIONAL:  []  fever, []  chills  Physical Examination  Filed Vitals:   10/08/14 1353  BP: 149/52  Pulse: 60  Height: 5\' 11"  (1.803 m)  Weight: 242 lb (109.77 kg)  SpO2: 97%   Body mass index is 33.77 kg/(m^2).  General: A&O x 3, ill-appearing male in NAD  Head: [place]/AT  Neck: Supple  Pulmonary: Sym exp, good air movt, CTAB, no rales, rhonchi, & wheezing   Cardiac: RRR, Nl S1, S2, harsh systolic murmur  Vascular: Vessel Right Left  Radial Palpable Palpable  Ulnar Palpable Palpable  Brachial Palpable Palpable  Carotid Palpable, with bruit Palpable, without bruit   Gastrointestinal: soft, NTND, -G/R, - HSM, - masses  Musculoskeletal: M/S 5/5 throughout. Extremities without ischemic changes. Feet are warm and pink bilaterally.   Neurologic: CN 2-12 grossly intact. Pain and light touch intact in extremities. Motor exam as listed above  Psychiatric: Judgment intact, Mood & affect appropriate for pt's clinical situation  Dermatologic: See M/S exam for extremity exam, no rashes otherwise noted  Lymph; no palpable cervical LAD  Non-Invasive Vascular Imaging  Vein Mapping  (Date: 10/08/2014):   R arm: acceptable vein conduits include cephalic and basilic  L arm: acceptable vein conduits include cephalic and basilic                                     BUE Doppler (Date: 10/08/2014):   Patent arteries of bilateral upper extremities.   Medical Decision Making  Jeffrey [name] is a 73 y.o. male who presents with CKD stage IV not yet on dialysis  Based on vein mapping and examination, this patient's permanent access options include: left radial cephalic versus brachial cephalic AV fistula.   I had an extensive discussion with this patient in  regards to the nature of access surgery, including risk, benefits, and alternatives.    The patient is aware that the risks of access surgery include but are not limited to: bleeding, infection, steal syndrome, nerve damage, ischemic monomelic neuropathy, failure of access to mature, and possible need for additional access procedures in the future.  The patient has  agreed to proceed with the above procedure which will be scheduled 10/19/14.  Virgina Jock, PA-C [facility] [place] Office: [phone] Pager: [phone]  10/08/2014, 2:20 PM  This patient was seen in conjunction with Dr. Bridgett Larsson  Addendum  I have independently interviewed and examined the patient, and  I agree with the physician assistant's findings.  Pt has multiple options for fistula.  Would start with L RC vs BC AVF.  Risk, benefits, and alternatives to access surgery were discussed.  The patient is aware the risks include but are not limited to: bleeding, infection, steal syndrome, nerve damage, ischemic monomelic neuropathy, failure to mature, need for additional procedures, death and stroke.  The patient agrees to proceed forward with the procedure.   Adele Barthel, MD [facility] [place] Office: [phone] Pager: [phone]  10/08/2014, 5:24 PM

## 2014-10-18 ENCOUNTER — Encounter (HOSPITAL_COMMUNITY): Payer: Self-pay | Admitting: *Deleted

## 2014-10-18 MED ORDER — DEXTROSE 5 % IV SOLN
1.5000 g | INTRAVENOUS | Status: AC
  Administered 2014-10-19: 1.5 g via INTRAVENOUS
  Filled 2014-10-18: qty 1.5

## 2014-10-18 NOTE — Progress Notes (Signed)
Dr. E.Fitzgerald ( anesthesia) made aware that pt spouse indicated that pt had a  MI this month. No new orders given. Anesthesia to review DOS.

## 2014-10-18 NOTE — Progress Notes (Signed)
Pt authorized wife to help complete SDW pre-op call while on speaker-phone. According to wife Jacqlyn Larsen, Pt had an MI on 10/02/14. Pt denies SOB and chest pain.

## 2014-10-19 ENCOUNTER — Encounter (HOSPITAL_COMMUNITY)
Admission: RE | Disposition: A | Discharge status: Home / Self Care | Payer: Self-pay | Source: Ambulatory Visit | Attending: Vascular Surgery

## 2014-10-19 ENCOUNTER — Ambulatory Visit (HOSPITAL_COMMUNITY)
Admission: RE | Admit: 2014-10-19 | Discharge: 2014-10-19 | Disposition: A | Discharge status: Home / Self Care | Payer: Medicare Other | Source: Ambulatory Visit | Attending: Vascular Surgery | Admitting: Vascular Surgery

## 2014-10-19 ENCOUNTER — Encounter (HOSPITAL_COMMUNITY): Payer: Self-pay | Admitting: Anesthesiology

## 2014-10-19 ENCOUNTER — Telehealth: Payer: Self-pay | Admitting: Vascular Surgery

## 2014-10-19 ENCOUNTER — Ambulatory Visit (HOSPITAL_COMMUNITY): Payer: Medicare Other | Admitting: Anesthesiology

## 2014-10-19 DIAGNOSIS — I129 Hypertensive chronic kidney disease with stage 1 through stage 4 chronic kidney disease, or unspecified chronic kidney disease: Secondary | ICD-10-CM | POA: Diagnosis not present

## 2014-10-19 DIAGNOSIS — E119 Type 2 diabetes mellitus without complications: Secondary | ICD-10-CM | POA: Diagnosis not present

## 2014-10-19 DIAGNOSIS — I350 Nonrheumatic aortic (valve) stenosis: Secondary | ICD-10-CM | POA: Diagnosis not present

## 2014-10-19 DIAGNOSIS — E785 Hyperlipidemia, unspecified: Secondary | ICD-10-CM | POA: Insufficient documentation

## 2014-10-19 DIAGNOSIS — I252 Old myocardial infarction: Secondary | ICD-10-CM | POA: Diagnosis not present

## 2014-10-19 DIAGNOSIS — I4891 Unspecified atrial fibrillation: Secondary | ICD-10-CM | POA: Diagnosis not present

## 2014-10-19 DIAGNOSIS — Z794 Long term (current) use of insulin: Secondary | ICD-10-CM | POA: Insufficient documentation

## 2014-10-19 DIAGNOSIS — N184 Chronic kidney disease, stage 4 (severe): Secondary | ICD-10-CM | POA: Insufficient documentation

## 2014-10-19 DIAGNOSIS — I739 Peripheral vascular disease, unspecified: Secondary | ICD-10-CM | POA: Diagnosis not present

## 2014-10-19 DIAGNOSIS — Z7982 Long term (current) use of aspirin: Secondary | ICD-10-CM | POA: Diagnosis not present

## 2014-10-19 DIAGNOSIS — Z87891 Personal history of nicotine dependence: Secondary | ICD-10-CM | POA: Insufficient documentation

## 2014-10-19 HISTORY — DX: Pneumonia, unspecified organism: J18.9

## 2014-10-19 HISTORY — PX: AV FISTULA PLACEMENT: SHX1204

## 2014-10-19 HISTORY — DX: Claustrophobia: F40.240

## 2014-10-19 LAB — POCT I-STAT 4, (NA,K, GLUC, HGB,HCT)
GLUCOSE: 169 mg/dL — AB (ref 70–99)
HEMATOCRIT: 30 % — AB (ref 39.0–52.0)
HEMOGLOBIN: 10.2 g/dL — AB (ref 13.0–17.0)
Potassium: 4.1 mEq/L (ref 3.7–5.3)
Sodium: 134 mEq/L — ABNORMAL LOW (ref 137–147)

## 2014-10-19 LAB — GLUCOSE, CAPILLARY
GLUCOSE-CAPILLARY: 169 mg/dL — AB (ref 70–99)
GLUCOSE-CAPILLARY: 175 mg/dL — AB (ref 70–99)

## 2014-10-19 SURGERY — ARTERIOVENOUS (AV) FISTULA CREATION
Anesthesia: General | Site: Arm Upper | Laterality: Left

## 2014-10-19 MED ORDER — LIDOCAINE-EPINEPHRINE (PF) 1 %-1:200000 IJ SOLN
INTRAMUSCULAR | Status: DC | PRN
  Administered 2014-10-19: 10 mL

## 2014-10-19 MED ORDER — THROMBIN 20000 UNITS EX SOLR
CUTANEOUS | Status: AC
  Filled 2014-10-19: qty 20000

## 2014-10-19 MED ORDER — SODIUM CHLORIDE 0.9 % IR SOLN
Status: DC | PRN
  Administered 2014-10-19: 500 mL

## 2014-10-19 MED ORDER — HYDROMORPHONE HCL 1 MG/ML IJ SOLN: 0.2500 mg | INTRAMUSCULAR | Status: DC | PRN

## 2014-10-19 MED ORDER — ONDANSETRON HCL 4 MG/2ML IJ SOLN: 4.0000 mg | Freq: Once | INTRAMUSCULAR | Status: DC | PRN

## 2014-10-19 MED ORDER — CEFUROXIME SODIUM 1.5 G IJ SOLR
INTRAMUSCULAR | Status: AC
  Filled 2014-10-19: qty 1.5

## 2014-10-19 MED ORDER — BUPIVACAINE HCL (PF) 0.5 % IJ SOLN
INTRAMUSCULAR | Status: DC | PRN
  Administered 2014-10-19: 10 mL

## 2014-10-19 MED ORDER — CHLORHEXIDINE GLUCONATE CLOTH 2 % EX PADS: 6.0000 | MEDICATED_PAD | Freq: Once | CUTANEOUS | Status: DC

## 2014-10-19 MED ORDER — LIDOCAINE-EPINEPHRINE (PF) 1 %-1:200000 IJ SOLN
INTRAMUSCULAR | Status: AC
  Filled 2014-10-19: qty 10

## 2014-10-19 MED ORDER — SODIUM CHLORIDE 0.9 % IV SOLN
INTRAVENOUS | Status: DC
  Administered 2014-10-19 (×2): via INTRAVENOUS

## 2014-10-19 MED ORDER — FENTANYL CITRATE 0.05 MG/ML IJ SOLN
INTRAMUSCULAR | Status: DC | PRN
  Administered 2014-10-19 (×2): 50 ug via INTRAVENOUS

## 2014-10-19 MED ORDER — FENTANYL CITRATE 0.05 MG/ML IJ SOLN
INTRAMUSCULAR | Status: AC
  Filled 2014-10-19: qty 5

## 2014-10-19 MED ORDER — OXYCODONE-ACETAMINOPHEN 5-325 MG PO TABS: 1.0000 | ORAL_TABLET | Freq: Four times a day (QID) | ORAL | Status: DC | PRN

## 2014-10-19 MED ORDER — THROMBIN 20000 UNITS EX SOLR
CUTANEOUS | Status: DC | PRN
  Administered 2014-10-19: 20 mL via TOPICAL

## 2014-10-19 MED ORDER — PROPOFOL 10 MG/ML IV BOLUS
INTRAVENOUS | Status: DC | PRN
  Administered 2014-10-19 (×3): 20 mg via INTRAVENOUS

## 2014-10-19 MED ORDER — 0.9 % SODIUM CHLORIDE (POUR BTL) OPTIME
TOPICAL | Status: DC | PRN
  Administered 2014-10-19: 1000 mL

## 2014-10-19 MED ORDER — PROPOFOL 10 MG/ML IV BOLUS
INTRAVENOUS | Status: AC
  Filled 2014-10-19: qty 20

## 2014-10-19 SURGICAL SUPPLY — 38 items
ADH SKN CLS APL DERMABOND .7 (GAUZE/BANDAGES/DRESSINGS) ×1
ARMBAND PINK RESTRICT EXTREMIT (MISCELLANEOUS) ×3 IMPLANT
BLADE SURG 10 STRL SS (BLADE) ×3 IMPLANT
CANISTER SUCTION 2500CC (MISCELLANEOUS) ×3 IMPLANT
CLIP TI MEDIUM 6 (CLIP) ×3 IMPLANT
CLIP TI WIDE RED SMALL 6 (CLIP) ×3 IMPLANT
COVER PROBE W GEL 5X96 (DRAPES) ×2 IMPLANT
COVER SURGICAL LIGHT HANDLE (MISCELLANEOUS) ×3 IMPLANT
DECANTER SPIKE VIAL GLASS SM (MISCELLANEOUS) ×3 IMPLANT
DERMABOND ADVANCED (GAUZE/BANDAGES/DRESSINGS) ×2
DERMABOND ADVANCED .7 DNX12 (GAUZE/BANDAGES/DRESSINGS) IMPLANT
ELECT REM PT RETURN 9FT ADLT (ELECTROSURGICAL) ×3
ELECTRODE REM PT RTRN 9FT ADLT (ELECTROSURGICAL) ×1 IMPLANT
GLOVE BIO SURGEON STRL SZ 6.5 (GLOVE) ×1 IMPLANT
GLOVE BIO SURGEON STRL SZ7 (GLOVE) ×3 IMPLANT
GLOVE BIO SURGEONS STRL SZ 6.5 (GLOVE) ×1
GLOVE BIOGEL PI IND STRL 6.5 (GLOVE) IMPLANT
GLOVE BIOGEL PI IND STRL 7.5 (GLOVE) ×1 IMPLANT
GLOVE BIOGEL PI INDICATOR 6.5 (GLOVE) ×4
GLOVE BIOGEL PI INDICATOR 7.5 (GLOVE) ×2
GLOVE ECLIPSE 6.5 STRL STRAW (GLOVE) ×2 IMPLANT
GOWN STRL REUS W/ TWL LRG LVL3 (GOWN DISPOSABLE) ×3 IMPLANT
GOWN STRL REUS W/TWL LRG LVL3 (GOWN DISPOSABLE) ×9
KIT BASIN OR (CUSTOM PROCEDURE TRAY) ×3 IMPLANT
KIT ROOM TURNOVER OR (KITS) ×3 IMPLANT
LIQUID BAND (GAUZE/BANDAGES/DRESSINGS) ×3 IMPLANT
NS IRRIG 1000ML POUR BTL (IV SOLUTION) ×3 IMPLANT
PACK CV ACCESS (CUSTOM PROCEDURE TRAY) ×3 IMPLANT
PAD ARMBOARD 7.5X6 YLW CONV (MISCELLANEOUS) ×6 IMPLANT
PROBE PENCIL 8 MHZ STRL DISP (MISCELLANEOUS) IMPLANT
SPONGE SURGIFOAM ABS GEL 100 (HEMOSTASIS) ×2 IMPLANT
SUT MNCRL AB 4-0 PS2 18 (SUTURE) ×3 IMPLANT
SUT PROLENE 6 0 BV (SUTURE) ×4 IMPLANT
SUT PROLENE 7 0 BV 1 (SUTURE) ×5 IMPLANT
SUT VIC AB 3-0 SH 27 (SUTURE) ×3
SUT VIC AB 3-0 SH 27X BRD (SUTURE) ×1 IMPLANT
UNDERPAD 30X30 INCONTINENT (UNDERPADS AND DIAPERS) ×3 IMPLANT
WATER STERILE IRR 1000ML POUR (IV SOLUTION) ×3 IMPLANT

## 2014-10-19 NOTE — H&P (View-Only) (Signed)
New Access Referred by:  Windy Kalata, MD [address] [place], [place] [address]  Reason for referral: New access  History of Present Illness  Jeffrey [name] is a 73 y.o. (May 05, 1941) male who presents for evaluation for permanent access.  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 The patient is right hand dominant.  The patient has not had previous access procedures.   Of note, he was recently discharged from [facility] on 11/7 after being admitted for chest pain. His cardiac catheterization on 11/5 revealed non obstructive CAD, except for severe disease of the RCA with extensive left-to-right collaterals. His 2D ECHO revealed an EF of 65-70% and severe aortic stenosis. He also had new left sided weakness while hospitalized, but his CT scan was negative for acute stroke. He had a carotid duplex study performed yesterday at [facility].   He has hypertension managed on two antihypertensives. He has insulin dependent diabetes. He has hyperlipidemia managed on a statin. He takes a daily aspirin.   Past Medical History  Diagnosis Date  . Diabetes mellitus   . Hyperlipidemia   . Hypertension   . Chronic back pain   . Renal disorder   . Aortic stenosis   . Anemia   . Atrial fibrillation   . Myocardial infarction   . Peripheral vascular disease     Past Surgical History  Procedure Laterality Date  . Cholecystectomy    . Esophagogastroduodenoscopy  08/01/2012    Procedure: ESOPHAGOGASTRODUODENOSCOPY (EGD);  Surgeon: [name], MD;  Location: [facility];  Service: Endoscopy;  Laterality: N/A;    History   Social History  . Marital Status: Married    Spouse Name: N/A    Number of Children: N/A  . Years of Education: N/A   Occupational History  . Not on file.   Social History Main Topics  . Smoking status: Former Smoker -- 2.00 packs/day for 32 years    Quit date: 11/27/1983  . Smokeless tobacco: Not on file  . Alcohol Use: No     Comment: occ  . Drug Use: No  .  Sexual Activity: Not on file   Other Topics Concern  . Not on file   Social History Narrative    Family History  Problem Relation Age of Onset  . Diabetes Father   . Heart disease Father   . Diabetes Sister     Current Outpatient Prescriptions on File Prior to Visit  Medication Sig Dispense Refill  . amLODipine (NORVASC) 10 MG tablet Take 10 mg by mouth daily.      [name] aspirin 81 MG chewable tablet Chew 81 mg by mouth daily.    . calcitRIOL (ROCALTROL) 0.25 MCG capsule Take 0.25 mcg by mouth daily.  6  . furosemide (LASIX) 80 MG tablet Take 1 tablet (80 mg total) by mouth 2 (two) times daily. 60 tablet 1  . hydrALAZINE (APRESOLINE) 50 MG tablet Take 1 tablet (50 mg total) by mouth every 6 (six) hours. 120 tablet 1  . Insulin Detemir (LEVEMIR) 100 UNIT/ML Pen Inject 35 Units into the skin at bedtime.    . simvastatin (ZOCOR) 20 MG tablet Take 1 tablet (20 mg total) by mouth at bedtime. 30 tablet 1  . nitroGLYCERIN (NITROSTAT) 0.4 MG SL tablet Take 0.4 mg by mouth as needed.     No current facility-administered medications on file prior to visit.    Allergies  Allergen Reactions  . Byetta 10 Mcg Pen [Exenatide] Nausea And Vomiting  REVIEW OF SYSTEMS:  (Positives checked otherwise negative)  CARDIOVASCULAR:  [x]  previous chest pain, [x]  chest pressure, []  palpitations, [x]  shortness of breath when laying flat, [x]  shortness of breath with exertion,  [x]  pain in feet when walking, []  pain in feet when laying flat, []  history of blood clot in veins (DVT), []  history of phlebitis, [x]  swelling in legs, []  varicose veins  PULMONARY:  []  productive cough, []  asthma, []  wheezing  NEUROLOGIC:  [x]  weakness in arms or legs, [x]  numbness in arms or legs, []  difficulty speaking or slurred speech, [x]  temporary loss of vision in one eye, []  dizziness  HEMATOLOGIC:  []  bleeding problems, []  problems with blood clotting too easily  MUSCULOSKEL:  []  joint pain, []  joint  swelling  GASTROINTEST:  []  vomiting blood, []  blood in stool     GENITOURINARY:  []  burning with urination, []  blood in urine  PSYCHIATRIC:  []  history of major depression  INTEGUMENTARY:  []  rashes, []  ulcers  CONSTITUTIONAL:  []  fever, []  chills  Physical Examination  Filed Vitals:   10/08/14 1353  BP: 149/52  Pulse: 60  Height: 5\' 11"  (1.803 m)  Weight: 242 lb (109.77 kg)  SpO2: 97%   Body mass index is 33.77 kg/(m^2).  General: A&O x 3, ill-appearing male in NAD  Head: [place]/AT  Neck: Supple  Pulmonary: Sym exp, good air movt, CTAB, no rales, rhonchi, & wheezing   Cardiac: RRR, Nl S1, S2, harsh systolic murmur  Vascular: Vessel Right Left  Radial Palpable Palpable  Ulnar Palpable Palpable  Brachial Palpable Palpable  Carotid Palpable, with bruit Palpable, without bruit   Gastrointestinal: soft, NTND, -G/R, - HSM, - masses  Musculoskeletal: M/S 5/5 throughout. Extremities without ischemic changes. Feet are warm and pink bilaterally.   Neurologic: CN 2-12 grossly intact. Pain and light touch intact in extremities. Motor exam as listed above  Psychiatric: Judgment intact, Mood & affect appropriate for pt's clinical situation  Dermatologic: See M/S exam for extremity exam, no rashes otherwise noted  Lymph; no palpable cervical LAD  Non-Invasive Vascular Imaging  Vein Mapping  (Date: 10/08/2014):   R arm: acceptable vein conduits include cephalic and basilic  L arm: acceptable vein conduits include cephalic and basilic                                     BUE Doppler (Date: 10/08/2014):   Patent arteries of bilateral upper extremities.   Medical Decision Making  Jeffrey [name] is a 73 y.o. male who presents with CKD stage IV not yet on dialysis  Based on vein mapping and examination, this patient's permanent access options include: left radial cephalic versus brachial cephalic AV fistula.   I had an extensive discussion with this patient in  regards to the nature of access surgery, including risk, benefits, and alternatives.    The patient is aware that the risks of access surgery include but are not limited to: bleeding, infection, steal syndrome, nerve damage, ischemic monomelic neuropathy, failure of access to mature, and possible need for additional access procedures in the future.  The patient has  agreed to proceed with the above procedure which will be scheduled 10/19/14.  Virgina Jock, PA-C [facility] [place] Office: [phone] Pager: [phone]  10/08/2014, 2:20 PM  This patient was seen in conjunction with Dr. Bridgett Larsson  Addendum  I have independently interviewed and examined the patient, and  I agree with the physician assistant's findings.  Pt has multiple options for fistula.  Would start with L RC vs BC AVF.  Risk, benefits, and alternatives to access surgery were discussed.  The patient is aware the risks include but are not limited to: bleeding, infection, steal syndrome, nerve damage, ischemic monomelic neuropathy, failure to mature, need for additional procedures, death and stroke.  The patient agrees to proceed forward with the procedure.   Adele Barthel, MD [facility] [place] Office: [phone] Pager: [phone]  10/08/2014, 5:24 PM

## 2014-10-19 NOTE — Anesthesia Postprocedure Evaluation (Signed)
  Anesthesia Post-op Note  Patient: Jeffrey [name]  Procedure(s) Performed: Procedure(s): BRACHIOCEPHALIC ARTERIOVENOUS (AV) FISTULA CREATION  (Left)  Patient Location: PACU  Anesthesia Type:MAC  Level of Consciousness: awake, alert , oriented and patient cooperative  Airway and Oxygen Therapy: Patient Spontanous Breathing  Post-op Pain: none  Post-op Assessment: Post-op Vital signs reviewed, Patient's Cardiovascular Status Stable, Respiratory Function Stable, Patent Airway, No signs of Nausea or vomiting and Pain level controlled  Post-op Vital Signs: stable  Last Vitals:  Filed Vitals:   10/19/14 0919  BP: 138/46  Pulse: 59  Temp: 36.5 C  Resp: 16    Complications: No apparent anesthesia complications

## 2014-10-19 NOTE — Anesthesia Procedure Notes (Signed)
Procedure Name: MAC Date/Time: 10/19/2014 7:30 AM Performed by: Babs Bertin Pre-anesthesia Checklist: Patient identified, Emergency Drugs available, Suction available, Patient being monitored and Timeout performed Patient Re-evaluated:Patient Re-evaluated prior to inductionOxygen Delivery Method: Simple face mask

## 2014-10-19 NOTE — Op Note (Signed)
OPERATIVE NOTE   PROCEDURE: left brachiocephalic arteriovenous fistula placement  PRE-OPERATIVE DIAGNOSIS: chronic kidney disease stage IV   POST-OPERATIVE DIAGNOSIS: same as above   SURGEON: Adele Barthel, MD  ASSISTANT(S): Silva Bandy, PAC   ANESTHESIA: local and MAC  ESTIMATED BLOOD LOSS: 50 cc  FINDING(S): 1.  Palpable thrill and radial pulse at the end of case  SPECIMEN(S):  none  INDICATIONS:   Jeffrey [name] is a 73 y.o. male who presents with chronic kidney disease stage IV.  The patient is scheduled for left brachiocephalic arteriovenous fistula placement.  The patient is aware the risks include but are not limited to: bleeding, infection, steal syndrome, nerve damage, ischemic monomelic neuropathy, failure to mature, and need for additional procedures.  The patient is aware of the risks of the procedure and elects to proceed forward.  DESCRIPTION: After full informed written consent was obtained from the patient, the patient was brought back to the operating room and placed supine upon the operating table.  Prior to induction, the patient received IV antibiotics.   After obtaining adequate anesthesia, the patient was then prepped and draped in the standard fashion for a left arm access procedure.  I turned my attention first to identifying the patient's wrist cephalic vein and radial artery.  The vein appeared too small for use for access purposes.  I then turned my attention to the patient's antecubitum, there I identified the cephalic vein and brachial artery.  Using SonoSite guidance, the location of these vessels were marked out on the skin.   At this point, I injected local anesthetic to obtain a field block of the antecubitum.  In total, I injected about 5 mL of a 1:1 mixture of 0.5% Marcaine without epinephrine and 1% lidocaine with epinephrine.  I made a transverse incision at the level of the antecubitum and dissected through the subcutaneous tissue and fascia to gain  exposure of the brachial artery.  This was noted to be 5 mm in diameter externally.  This was dissected out proximally and distally and controlled with vessel loops .  I then dissected out the cephalic vein.  This was noted to be 4-5 mm in diameter externally.  The distal segment of the vein was ligated with a  2-0 silk, and the vein was transected.  The proximal segment was iinterrogated with serial dilators.  The vein accepted up to a 5 mm dilator without any difficulty.  I then instilled the heparinized saline into the vein and clamped it.  At this point, I reset my exposure of the brachial artery and placed the artery under tension proximally and distally.  I made an arteriotomy with a #11 blade, and then I extended the arteriotomy with a Potts scissor.  I injected heparinized saline proximal and distal to this arteriotomy.  The vein was then sewn to the artery in an end-to-side configuration with a running stitch of 7-0 Prolene.  Prior to completing this anastomosis, I allowed the vein and artery to backbleed.  There was no evidence of clot from any vessels.  I completed the anastomosis in the usual fashion and then released all vessel loops and clamps.  There was palpable  thrill in the venous outflow, and there was a palpable radial pulse.  At this point, I irrigated out the surgical wound.  There was no further active bleeding.  The subcutaneous tissue was reapproximated with a running stitch of 3-0 Vicryl.  The skin was then reapproximated with a running subcuticular  stitch of 4-0 Vicryl.  The skin was then cleaned, dried, and reinforced with Dermabond.  The patient tolerated this procedure well.   COMPLICATIONS: none  CONDITION: stable  Adele Barthel, MD [facility] [place] Office: [phone] Pager: [phone]  10/19/2014, 9:13 AM

## 2014-10-19 NOTE — Telephone Encounter (Addendum)
-----   Message from Mena Goes, RN sent at 10/19/2014  9:35 AM EST ----- Regarding: Schedule   ----- Message -----    From: Alvia Grove, PA-C    Sent: 10/19/2014   9:17 AM      To: [facility] Charge Pool  S/p left brachial cephalic AVF [id number]  F/u with Dr. Bridgett Larsson in 6 weeks. No duplex.  Thanks Kim    10/19/14: patients phone # does not have voicemail. Mailed letter to home address, dpm

## 2014-10-19 NOTE — Transfer of Care (Signed)
Immediate Anesthesia Transfer of Care Note  Patient: Jeffrey [name]  Procedure(s) Performed: Procedure(s): BRACHIOCEPHALIC ARTERIOVENOUS (AV) FISTULA CREATION  (Left)  Patient Location: PACU  Anesthesia Type:MAC  Level of Consciousness: awake, alert  and oriented  Airway & Oxygen Therapy: Patient Spontanous Breathing  Post-op Assessment: Report given to PACU RN and Post -op Vital signs reviewed and stable  Post vital signs: Reviewed and stable  Complications: No apparent anesthesia complications

## 2014-10-19 NOTE — Anesthesia Preprocedure Evaluation (Signed)
Anesthesia Evaluation  Patient identified by MRN, date of birth, ID band Patient awake    Reviewed: Allergy & Precautions, H&P , NPO status , Patient's Chart, lab work & pertinent test results  Airway        Dental   Pulmonary former smoker,          Cardiovascular hypertension, + CAD, + Past MI and + Peripheral Vascular Disease + dysrhythmias Atrial Fibrillation + Valvular Problems/Murmurs AS     Neuro/Psych    GI/Hepatic   Endo/Other  diabetes, Type 2, Insulin DependentMorbid obesity  Renal/GU Dialysis, ESRF and CRFRenal disease     Musculoskeletal  (+) Arthritis -,   Abdominal   Peds  Hematology  (+) anemia ,   Anesthesia Other Findings   Reproductive/Obstetrics                             Anesthesia Physical Anesthesia Plan  ASA: III  Anesthesia Plan: General   Post-op Pain Management:    Induction: Intravenous  Airway Management Planned: Oral ETT  Additional Equipment:   Intra-op Plan:   Post-operative Plan: Extubation in OR  Informed Consent: I have reviewed the patients History and Physical, chart, labs and discussed the procedure including the risks, benefits and alternatives for the proposed anesthesia with the patient or authorized representative who has indicated his/her understanding and acceptance.     Plan Discussed with: CRNA, Anesthesiologist and Surgeon  Anesthesia Plan Comments:         Anesthesia Quick Evaluation

## 2014-10-19 NOTE — Interval H&P Note (Signed)
History and Physical Interval Note:  10/19/2014 7:07 AM  Jeffrey [name]  has presented today for surgery, with the diagnosis of Stage IV Chronic Kidney Disease N18.4  The various methods of treatment have been discussed with the patient and family. After consideration of risks, benefits and other options for treatment, the patient has consented to  Procedure(s): RADIOCEPHALIC VERSUS BRACHIOCEPHALIC ARTERIOVENOUS (AV) FISTULA CREATION  (Left) as a surgical intervention .  The patient's history has been reviewed, patient examined, no change in status, stable for surgery.  I have reviewed the patient's chart and labs.  Questions were answered to the patient's satisfaction.     Azad Calame [id number]

## 2014-10-19 NOTE — Discharge Instructions (Signed)
What to eat: ° °For your first meals, you should eat lightly; only small meals initially.  If you do not have nausea, you may eat larger meals.  Avoid spicy, greasy and heavy food.   ° °General Anesthesia, Adult, Care After  °Refer to this sheet in the next few weeks. These instructions provide you with information on caring for yourself after your procedure. Your health care provider may also give you more specific instructions. Your treatment has been planned according to current medical practices, but problems sometimes occur. Call your health care provider if you have any problems or questions after your procedure.  °WHAT TO EXPECT AFTER THE PROCEDURE  °After the procedure, it is typical to experience:  °Sleepiness.  °Nausea and vomiting. °HOME CARE INSTRUCTIONS  °For the first 24 hours after general anesthesia:  °Have a responsible person with you.  °Do not drive a car. If you are alone, do not take public transportation.  °Do not drink alcohol.  °Do not take medicine that has not been prescribed by your health care provider.  °Do not sign important papers or make important decisions.  °You may resume a normal diet and activities as directed by your health care provider.  °Change bandages (dressings) as directed.  °If you have questions or problems that seem related to general anesthesia, call the hospital and ask for the anesthetist or anesthesiologist on call. °SEEK MEDICAL CARE IF:  °You have nausea and vomiting that continue the day after anesthesia.  °You develop a rash. °SEEK IMMEDIATE MEDICAL CARE IF:  °You have difficulty breathing.  °You have chest pain.  °You have any allergic problems. °Document Released: 02/18/2001 Document Revised: 07/15/2013 Document Reviewed: 05/28/2013  °ExitCare® Patient Information ©2014 [employer].  ° °Tissue Adhesive Wound Care  ° ° °Some cuts and wounds can be closed with tissue adhesive. Adhesive is like glue. It holds the skin together and helps a wound heal faster.  This adhesive goes away on its own as the wound heals.  °HOME CARE  °Showers are allowed. Do not soak the wound in water. Do not take baths, swim, or use hot tubs. Do not use soaps or creams on your wound.  °If a bandage (dressing) was put on, change it as often as told by your doctor.  °Keep the bandage dry.  °Do not scratch, pick, or rub the adhesive.  °Do not put tape over the adhesive. The adhesive could come off.  °Protect the wound from another injury.  °Protect the wound from sun and tanning beds.  °Only take medicine as told by your doctor.  °Keep all doctor visits as told. °GET HELP RIGHT AWAY IF:  °Your wound is red, puffy (swollen), hot, or tender.  °You get a rash after the glue is put on.  °You have more pain in the wound.  °You have a red streak going away from the wound.  °You have yellowish-white fluid (pus) coming from the wound.  °You have more bleeding.  °You have a fever.  °You have chills and start to shake.  °You notice a bad smell coming from the wound.  °Your wound or adhesive breaks open. °MAKE SURE YOU:  °Understand these instructions.  °Will watch your condition.  °Will get help right away if you are not doing well or get worse. °Document Released: 08/21/2008 Document Revised: 09/02/2013 Document Reviewed: 06/03/2013  °ExitCare® Patient Information ©2015 [employer]. This information is not intended to replace advice given to you by your health care provider.   Make sure you discuss any questions you have with your health care provider.  ° ° °

## 2014-10-20 ENCOUNTER — Encounter (HOSPITAL_COMMUNITY): Payer: Self-pay | Admitting: Vascular Surgery

## 2014-10-26 DIAGNOSIS — I639 Cerebral infarction, unspecified: Secondary | ICD-10-CM

## 2014-10-26 DIAGNOSIS — I219 Acute myocardial infarction, unspecified: Secondary | ICD-10-CM

## 2014-10-26 HISTORY — DX: Cerebral infarction, unspecified: I63.9

## 2014-10-26 HISTORY — DX: Acute myocardial infarction, unspecified: I21.9

## 2014-10-27 ENCOUNTER — Ambulatory Visit: Payer: Medicare Other | Admitting: Dietician

## 2014-10-28 ENCOUNTER — Ambulatory Visit: Payer: Medicare Other | Admitting: Physician Assistant

## 2014-11-04 ENCOUNTER — Encounter (HOSPITAL_COMMUNITY): Payer: Self-pay | Admitting: Cardiovascular Disease

## 2014-11-09 ENCOUNTER — Telehealth: Payer: Self-pay | Admitting: *Deleted

## 2014-11-09 NOTE — Telephone Encounter (Signed)
Faxed cardiac rehab phase II order to [facility] signed by Dr. Claiborne Billings.

## 2014-11-26 ENCOUNTER — Other Ambulatory Visit: Payer: Self-pay | Admitting: Internal Medicine

## 2014-12-02 ENCOUNTER — Encounter: Payer: Self-pay | Admitting: Vascular Surgery

## 2014-12-03 ENCOUNTER — Encounter: Payer: Self-pay | Admitting: Vascular Surgery

## 2014-12-03 ENCOUNTER — Ambulatory Visit (INDEPENDENT_AMBULATORY_CARE_PROVIDER_SITE_OTHER): Payer: Self-pay | Admitting: Vascular Surgery

## 2014-12-03 VITALS — BP 147/59 | HR 86 | Ht 71.0 in | Wt 237.0 lb

## 2014-12-03 DIAGNOSIS — N185 Chronic kidney disease, stage 5: Secondary | ICD-10-CM

## 2014-12-03 NOTE — Progress Notes (Signed)
    Postoperative Access Visit   History of Present Illness  ODAY Jeffrey [name] is a 74 y.o. year old male who presents for postoperative follow-up for: L BC AVF (Date: 10/19/14).  The patient's wounds are healed.  The patient notes no steal symptoms.  Pt has baseline paraesthesia in L hand from prior surgeries.  The patient is able to complete their activities of daily living.  The patient's current symptoms are: intermittent paraesthesia in finger tips.  For VQI Use Only  PRE-ADM LIVING: Home  AMB STATUS: Ambulatory  Physical Examination Filed Vitals:   12/03/14 0941  BP: 147/59  Pulse: 86    LUE: Incision is healed, skin feels warm, hand grip is 5/5, sensation in digits is intact, palpable thrill, bruit can be auscultated   Medical Decision Making  JOSH SHURTLEFF is a 74 y.o. year old male who presents s/p L BC AVF.  The patient's access is ready for use.  Thank you for allowing [place] to participate in this patient's care.  Adele Barthel, MD [facility] Office: [phone] Pager: [phone]  12/03/2014, 10:18 AM

## 2014-12-21 ENCOUNTER — Telehealth (HOSPITAL_COMMUNITY): Payer: Self-pay | Admitting: Cardiac Rehabilitation

## 2014-12-21 NOTE — Telephone Encounter (Signed)
pc to pt to enroll in cardiac rehab services.  Pt declined due to inability to afford $50 copay/visit.  Pt also declined cardiac maintenance program. Dr. Claiborne Billings made aware.

## 2014-12-24 ENCOUNTER — Other Ambulatory Visit: Payer: Self-pay | Admitting: Internal Medicine

## 2015-01-11 ENCOUNTER — Emergency Department (HOSPITAL_COMMUNITY): Payer: Medicare Other

## 2015-01-11 ENCOUNTER — Encounter (HOSPITAL_COMMUNITY): Payer: Self-pay

## 2015-01-11 ENCOUNTER — Encounter (HOSPITAL_COMMUNITY)
Admission: EM | Disposition: A | Discharge status: Home / Self Care | Payer: Self-pay | Source: Home / Self Care | Attending: Pulmonary Disease

## 2015-01-11 ENCOUNTER — Inpatient Hospital Stay (HOSPITAL_COMMUNITY)
Admission: EM | Admit: 2015-01-11 | Discharge: 2015-01-15 | DRG: 682 | Disposition: A | Discharge status: Home / Self Care | Payer: Medicare Other | Attending: Internal Medicine | Admitting: Internal Medicine

## 2015-01-11 DIAGNOSIS — F40240 Claustrophobia: Secondary | ICD-10-CM | POA: Diagnosis present

## 2015-01-11 DIAGNOSIS — Z9049 Acquired absence of other specified parts of digestive tract: Secondary | ICD-10-CM | POA: Diagnosis present

## 2015-01-11 DIAGNOSIS — Z992 Dependence on renal dialysis: Secondary | ICD-10-CM

## 2015-01-11 DIAGNOSIS — E119 Type 2 diabetes mellitus without complications: Secondary | ICD-10-CM | POA: Diagnosis present

## 2015-01-11 DIAGNOSIS — R531 Weakness: Secondary | ICD-10-CM | POA: Diagnosis present

## 2015-01-11 DIAGNOSIS — E875 Hyperkalemia: Secondary | ICD-10-CM | POA: Diagnosis present

## 2015-01-11 DIAGNOSIS — Z87891 Personal history of nicotine dependence: Secondary | ICD-10-CM

## 2015-01-11 DIAGNOSIS — J811 Chronic pulmonary edema: Secondary | ICD-10-CM | POA: Diagnosis present

## 2015-01-11 DIAGNOSIS — N189 Chronic kidney disease, unspecified: Secondary | ICD-10-CM

## 2015-01-11 DIAGNOSIS — I252 Old myocardial infarction: Secondary | ICD-10-CM | POA: Diagnosis not present

## 2015-01-11 DIAGNOSIS — N186 End stage renal disease: Secondary | ICD-10-CM | POA: Diagnosis present

## 2015-01-11 DIAGNOSIS — I441 Atrioventricular block, second degree: Secondary | ICD-10-CM | POA: Diagnosis present

## 2015-01-11 DIAGNOSIS — E43 Unspecified severe protein-calorie malnutrition: Secondary | ICD-10-CM | POA: Diagnosis present

## 2015-01-11 DIAGNOSIS — I2510 Atherosclerotic heart disease of native coronary artery without angina pectoris: Secondary | ICD-10-CM | POA: Diagnosis present

## 2015-01-11 DIAGNOSIS — N179 Acute kidney failure, unspecified: Secondary | ICD-10-CM

## 2015-01-11 DIAGNOSIS — N2581 Secondary hyperparathyroidism of renal origin: Secondary | ICD-10-CM | POA: Diagnosis present

## 2015-01-11 DIAGNOSIS — J810 Acute pulmonary edema: Secondary | ICD-10-CM

## 2015-01-11 DIAGNOSIS — I4891 Unspecified atrial fibrillation: Secondary | ICD-10-CM | POA: Diagnosis present

## 2015-01-11 DIAGNOSIS — Z8673 Personal history of transient ischemic attack (TIA), and cerebral infarction without residual deficits: Secondary | ICD-10-CM

## 2015-01-11 DIAGNOSIS — I739 Peripheral vascular disease, unspecified: Secondary | ICD-10-CM | POA: Diagnosis present

## 2015-01-11 DIAGNOSIS — D631 Anemia in chronic kidney disease: Secondary | ICD-10-CM | POA: Diagnosis present

## 2015-01-11 DIAGNOSIS — E785 Hyperlipidemia, unspecified: Secondary | ICD-10-CM | POA: Diagnosis present

## 2015-01-11 DIAGNOSIS — E872 Acidosis, unspecified: Secondary | ICD-10-CM

## 2015-01-11 DIAGNOSIS — I442 Atrioventricular block, complete: Secondary | ICD-10-CM | POA: Diagnosis present

## 2015-01-11 DIAGNOSIS — Z7982 Long term (current) use of aspirin: Secondary | ICD-10-CM | POA: Diagnosis not present

## 2015-01-11 DIAGNOSIS — I120 Hypertensive chronic kidney disease with stage 5 chronic kidney disease or end stage renal disease: Secondary | ICD-10-CM | POA: Diagnosis present

## 2015-01-11 DIAGNOSIS — Z794 Long term (current) use of insulin: Secondary | ICD-10-CM | POA: Diagnosis not present

## 2015-01-11 DIAGNOSIS — J9601 Acute respiratory failure with hypoxia: Secondary | ICD-10-CM | POA: Diagnosis present

## 2015-01-11 DIAGNOSIS — I350 Nonrheumatic aortic (valve) stenosis: Secondary | ICD-10-CM

## 2015-01-11 DIAGNOSIS — Z6832 Body mass index (BMI) 32.0-32.9, adult: Secondary | ICD-10-CM | POA: Diagnosis not present

## 2015-01-11 DIAGNOSIS — R0609 Other forms of dyspnea: Secondary | ICD-10-CM

## 2015-01-11 DIAGNOSIS — M1990 Unspecified osteoarthritis, unspecified site: Secondary | ICD-10-CM | POA: Diagnosis present

## 2015-01-11 DIAGNOSIS — N19 Unspecified kidney failure: Secondary | ICD-10-CM

## 2015-01-11 DIAGNOSIS — R0902 Hypoxemia: Secondary | ICD-10-CM

## 2015-01-11 HISTORY — DX: Atrioventricular block, complete: I44.2

## 2015-01-11 HISTORY — DX: Atherosclerotic heart disease of native coronary artery without angina pectoris: I25.10

## 2015-01-11 LAB — I-STAT TROPONIN, ED: TROPONIN I, POC: 0.02 ng/mL (ref 0.00–0.08)

## 2015-01-11 LAB — APTT: aPTT: 41 seconds — ABNORMAL HIGH (ref 24–37)

## 2015-01-11 LAB — GLUCOSE, CAPILLARY
GLUCOSE-CAPILLARY: 86 mg/dL (ref 70–99)
GLUCOSE-CAPILLARY: 101 mg/dL — AB (ref 70–99)
GLUCOSE-CAPILLARY: 93 mg/dL (ref 70–99)

## 2015-01-11 LAB — TSH: TSH: 3.687 u[IU]/mL (ref 0.350–4.500)

## 2015-01-11 LAB — COMPREHENSIVE METABOLIC PANEL
ALBUMIN: 3.4 g/dL — AB (ref 3.5–5.2)
ALK PHOS: 82 U/L (ref 39–117)
ALT: 8 U/L (ref 0–53)
ALT: 8 U/L (ref 0–53)
AST: 9 U/L (ref 0–37)
AST: 9 U/L (ref 0–37)
Albumin: 3.3 g/dL — ABNORMAL LOW (ref 3.5–5.2)
Alkaline Phosphatase: 77 U/L (ref 39–117)
Anion gap: 19 — ABNORMAL HIGH (ref 5–15)
Anion gap: 17 — ABNORMAL HIGH (ref 5–15)
BILIRUBIN TOTAL: 0.8 mg/dL (ref 0.3–1.2)
BUN: 154 mg/dL — AB (ref 6–23)
BUN: 154 mg/dL — ABNORMAL HIGH (ref 6–23)
CHLORIDE: 104 mmol/L (ref 96–112)
CHLORIDE: 104 mmol/L (ref 96–112)
CO2: 14 mmol/L — AB (ref 19–32)
CO2: 16 mmol/L — AB (ref 19–32)
CREATININE: 10.88 mg/dL — AB (ref 0.50–1.35)
Calcium: 8.3 mg/dL — ABNORMAL LOW (ref 8.4–10.5)
Calcium: 8.3 mg/dL — ABNORMAL LOW (ref 8.4–10.5)
Creatinine, Ser: 10.84 mg/dL — ABNORMAL HIGH (ref 0.50–1.35)
GFR calc Af Amer: 5 mL/min — ABNORMAL LOW (ref >90)
GFR calc Af Amer: 5 mL/min — ABNORMAL LOW (ref >90)
GFR calc non Af Amer: 4 mL/min — ABNORMAL LOW (ref >90)
GFR, EST NON AFRICAN AMERICAN: 4 mL/min — AB (ref >90)
GLUCOSE: 98 mg/dL (ref 70–99)
Glucose, Bld: 96 mg/dL (ref 70–99)
POTASSIUM: 5.4 mmol/L — AB (ref 3.5–5.1)
Potassium: 5.1 mmol/L (ref 3.5–5.1)
Sodium: 137 mmol/L (ref 135–145)
Sodium: 137 mmol/L (ref 135–145)
Total Bilirubin: 0.8 mg/dL (ref 0.3–1.2)
Total Protein: 6.1 g/dL (ref 6.0–8.3)
Total Protein: 6 g/dL (ref 6.0–8.3)

## 2015-01-11 LAB — BASIC METABOLIC PANEL
Anion gap: 9 (ref 5–15)
BUN: 109 mg/dL — ABNORMAL HIGH (ref 6–23)
CHLORIDE: 105 mmol/L (ref 96–112)
CO2: 25 mmol/L (ref 19–32)
Calcium: 8.2 mg/dL — ABNORMAL LOW (ref 8.4–10.5)
Creatinine, Ser: 8.39 mg/dL — ABNORMAL HIGH (ref 0.50–1.35)
GFR calc Af Amer: 6 mL/min — ABNORMAL LOW (ref >90)
GFR, EST NON AFRICAN AMERICAN: 5 mL/min — AB (ref >90)
GLUCOSE: 100 mg/dL — AB (ref 70–99)
POTASSIUM: 4.3 mmol/L (ref 3.5–5.1)
SODIUM: 139 mmol/L (ref 135–145)

## 2015-01-11 LAB — BRAIN NATRIURETIC PEPTIDE: B Natriuretic Peptide: 679.8 pg/mL — ABNORMAL HIGH (ref 0.0–100.0)

## 2015-01-11 LAB — I-STAT CG4 LACTIC ACID, ED
LACTIC ACID, VENOUS: 0.6 mmol/L (ref 0.5–2.0)
Lactic Acid, Venous: 0.86 mmol/L (ref 0.5–2.0)

## 2015-01-11 LAB — CBC WITH DIFFERENTIAL/PLATELET
Basophils Absolute: 0 10*3/uL (ref 0.0–0.1)
Basophils Relative: 0 % (ref 0–1)
EOS ABS: 0.1 10*3/uL (ref 0.0–0.7)
EOS PCT: 2 % (ref 0–5)
HCT: 24.8 % — ABNORMAL LOW (ref 39.0–52.0)
Hemoglobin: 8.3 g/dL — ABNORMAL LOW (ref 13.0–17.0)
LYMPHS PCT: 17 % (ref 12–46)
Lymphs Abs: 0.9 10*3/uL (ref 0.7–4.0)
MCH: 29.1 pg (ref 26.0–34.0)
MCHC: 33.5 g/dL (ref 30.0–36.0)
MCV: 87 fL (ref 78.0–100.0)
Monocytes Absolute: 0.6 10*3/uL (ref 0.1–1.0)
Monocytes Relative: 12 % (ref 3–12)
NEUTROS ABS: 3.6 10*3/uL (ref 1.7–7.7)
Neutrophils Relative %: 69 % (ref 43–77)
Platelets: 233 10*3/uL (ref 150–400)
RBC: 2.85 MIL/uL — AB (ref 4.22–5.81)
RDW: 14.1 % (ref 11.5–15.5)
WBC: 5.2 10*3/uL (ref 4.0–10.5)

## 2015-01-11 LAB — CBC
HEMATOCRIT: 25.7 % — AB (ref 39.0–52.0)
Hemoglobin: 8.6 g/dL — ABNORMAL LOW (ref 13.0–17.0)
MCH: 29.3 pg (ref 26.0–34.0)
MCHC: 33.5 g/dL (ref 30.0–36.0)
MCV: 87.4 fL (ref 78.0–100.0)
Platelets: 234 10*3/uL (ref 150–400)
RBC: 2.94 MIL/uL — AB (ref 4.22–5.81)
RDW: 14.1 % (ref 11.5–15.5)
WBC: 5.2 10*3/uL (ref 4.0–10.5)

## 2015-01-11 LAB — I-STAT CHEM 8, ED
BUN: 139 mg/dL — ABNORMAL HIGH (ref 6–23)
CHLORIDE: 105 mmol/L (ref 96–112)
Calcium, Ion: 1.03 mmol/L — ABNORMAL LOW (ref 1.13–1.30)
Creatinine, Ser: 10.8 mg/dL — ABNORMAL HIGH (ref 0.50–1.35)
GLUCOSE: 100 mg/dL — AB (ref 70–99)
HCT: 28 % — ABNORMAL LOW (ref 39.0–52.0)
HEMOGLOBIN: 9.5 g/dL — AB (ref 13.0–17.0)
Potassium: 5 mmol/L (ref 3.5–5.1)
Sodium: 137 mmol/L (ref 135–145)
TCO2: 14 mmol/L (ref 0–100)

## 2015-01-11 LAB — POCT I-STAT TROPONIN I: Troponin i, poc: 0.02 ng/mL (ref 0.00–0.08)

## 2015-01-11 LAB — MAGNESIUM: Magnesium: 2.9 mg/dL — ABNORMAL HIGH (ref 1.5–2.5)

## 2015-01-11 LAB — MRSA PCR SCREENING: MRSA BY PCR: NEGATIVE

## 2015-01-11 LAB — PROTIME-INR
INR: 1.33 (ref 0.00–1.49)
Prothrombin Time: 16.7 seconds — ABNORMAL HIGH (ref 11.6–15.2)

## 2015-01-11 LAB — LACTIC ACID, PLASMA: LACTIC ACID, VENOUS: 0.8 mmol/L (ref 0.5–2.0)

## 2015-01-11 SURGERY — TEMPORARY PACEMAKER INSERTION
Anesthesia: LOCAL

## 2015-01-11 MED ORDER — NEPRO/CARBSTEADY PO LIQD: 237.0000 mL | ORAL | Status: DC | PRN

## 2015-01-11 MED ORDER — HEPARIN SODIUM (PORCINE) 5000 UNIT/ML IJ SOLN
5000.0000 [IU] | Freq: Three times a day (TID) | INTRAMUSCULAR | Status: DC
  Administered 2015-01-11 – 2015-01-15 (×11): 5000 [IU] via SUBCUTANEOUS
  Filled 2015-01-11 (×15): qty 1

## 2015-01-11 MED ORDER — PENTAFLUOROPROP-TETRAFLUOROETH EX AERO: 1.0000 "application " | INHALATION_SPRAY | CUTANEOUS | Status: DC | PRN

## 2015-01-11 MED ORDER — ASPIRIN 81 MG PO CHEW
CHEWABLE_TABLET | ORAL | Status: AC
  Filled 2015-01-11: qty 4

## 2015-01-11 MED ORDER — CALCITRIOL 0.25 MCG PO CAPS
0.2500 ug | ORAL_CAPSULE | Freq: Every day | ORAL | Status: DC
  Administered 2015-01-12 – 2015-01-15 (×3): 0.25 ug via ORAL
  Filled 2015-01-11 (×4): qty 1

## 2015-01-11 MED ORDER — LIDOCAINE HCL (PF) 1 % IJ SOLN: 5.0000 mL | INTRAMUSCULAR | Status: DC | PRN

## 2015-01-11 MED ORDER — ATROPINE SULFATE 0.1 MG/ML IJ SOLN
INTRAMUSCULAR | Status: AC
  Administered 2015-01-11: 1 mg
  Filled 2015-01-11: qty 10

## 2015-01-11 MED ORDER — DARBEPOETIN ALFA 60 MCG/0.3ML IJ SOSY
60.0000 ug | PREFILLED_SYRINGE | INTRAMUSCULAR | Status: DC
  Administered 2015-01-11: 60 ug via INTRAVENOUS
  Filled 2015-01-11: qty 0.3

## 2015-01-11 MED ORDER — INSULIN ASPART 100 UNIT/ML ~~LOC~~ SOLN
2.0000 [IU] | SUBCUTANEOUS | Status: DC
  Administered 2015-01-12: 4 [IU] via SUBCUTANEOUS

## 2015-01-11 MED ORDER — SODIUM CHLORIDE 0.9 % IV SOLN
350.0000 mg | Freq: Once | INTRAVENOUS | Status: AC
  Administered 2015-01-11: 350 mg via INTRAVENOUS
  Filled 2015-01-11: qty 3.5

## 2015-01-11 MED ORDER — SODIUM CHLORIDE 0.9 % IV SOLN: 100.0000 mL | INTRAVENOUS | Status: DC | PRN

## 2015-01-11 MED ORDER — HEPARIN SODIUM (PORCINE) 1000 UNIT/ML DIALYSIS
1000.0000 [IU] | INTRAMUSCULAR | Status: DC | PRN
  Filled 2015-01-11: qty 1

## 2015-01-11 MED ORDER — LIDOCAINE-PRILOCAINE 2.5-2.5 % EX CREA: 1.0000 "application " | TOPICAL_CREAM | CUTANEOUS | Status: DC | PRN

## 2015-01-11 MED ORDER — ATROPINE SULFATE 1 MG/ML IJ SOLN: 1.0000 mg | Freq: Once | INTRAMUSCULAR | Status: AC

## 2015-01-11 MED ORDER — PANTOPRAZOLE SODIUM 40 MG PO TBEC
40.0000 mg | DELAYED_RELEASE_TABLET | Freq: Every day | ORAL | Status: DC
  Administered 2015-01-11 – 2015-01-15 (×5): 40 mg via ORAL
  Filled 2015-01-11 (×5): qty 1

## 2015-01-11 MED ORDER — HEPARIN SODIUM (PORCINE) 1000 UNIT/ML DIALYSIS
20.0000 [IU]/kg | INTRAMUSCULAR | Status: DC | PRN
  Filled 2015-01-11: qty 3

## 2015-01-11 MED ORDER — ASPIRIN 81 MG PO CHEW
324.0000 mg | CHEWABLE_TABLET | Freq: Once | ORAL | Status: AC
  Administered 2015-01-11: 324 mg via ORAL
  Filled 2015-01-11: qty 4

## 2015-01-11 MED ORDER — SODIUM BICARBONATE 8.4 % IV SOLN
50.0000 meq | Freq: Once | INTRAVENOUS | Status: AC
  Administered 2015-01-11: 50 meq via INTRAVENOUS
  Filled 2015-01-11: qty 50

## 2015-01-11 MED ORDER — ALTEPLASE 2 MG IJ SOLR: 2.0000 mg | Freq: Once | INTRAMUSCULAR | Status: AC | PRN

## 2015-01-11 NOTE — Care Management Note (Addendum)
    Page 1 of 1   01/16/2015     8:48:58 AM CARE MANAGEMENT NOTE 01/16/2015  Patient:  GERVASE, PETRICCA   Account Number:  [id number]  Date Initiated:  01/11/2015  Documentation initiated by:  Elissa Hefty  Subjective/Objective Assessment:   adm w complete heart block, temp pacer to be inserted     Action/Plan:   lives w wife, pcp dr Rosana Hoes cloward, esrd-hd   Anticipated DC Date:     Anticipated DC Plan:  HOME/SELF CARE         Choice offered to / List presented to:             Status of service:   Medicare Important Message given?  YES (If response is "NO", the following Medicare IM given date fields will be blank) Date Medicare IM given:  01/14/2015 Medicare IM given by:  Elissa Hefty Date Additional Medicare IM given:   Additional Medicare IM given by:    Discharge Disposition:  HOME/SELF CARE  Per UR Regulation:  Reviewed for med. necessity/level of care/duration of stay  If discussed at [place] of Stay Meetings, dates discussed:    Comments:  01/16/15 08:30 Cm called MD to confirm home oxygen order was not intended.  MD confirmed.  Pt sats at 97% room air.  No other CM needs were comunicated.  Mariane Masters, BSN, CM [phone].

## 2015-01-11 NOTE — ED Notes (Signed)
Nephrology at bedside

## 2015-01-11 NOTE — ED Notes (Signed)
[facility]- Pt coming from PCP, was seen there due to N/V/D for the last week. Pt was scheduled to have first dialysis treatment tomorrow but based on PCP findings he needs today. Pt noted to have dyspnea with exertion. No distress noted.

## 2015-01-11 NOTE — Progress Notes (Signed)
Patient arrived to unit via stretcher, he is alert and oriented, no complaints of pain or discomfort, hemodialysis nurse in room and started dialysis, she is currently at bedside with patient, family members inwaiting room and updated as to delay inseeing patient, contact information gathered, will continue to monitor patient

## 2015-01-11 NOTE — ED Provider Notes (Signed)
CSN: [id number]     Arrival date & time 01/11/15  [id number] History   First MD Initiated Contact with Patient 01/11/15 1003     Chief Complaint  Patient presents with  . Shortness of Breath  . Emesis     (Consider location/radiation/quality/duration/timing/severity/associated sxs/prior Treatment) Patient is a 74 y.o. male presenting with shortness of breath. The history is provided by the patient.  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 No fever or chills. Pt states his sx are generalized and began gradually and have progressively worsened over the past week. He called his PCP today who advised him to report to the ED for immediate evaluation. He endorses worsening constant SOB since yesterday as well with dry, nonproductive cough. His SOB is constant, worse when lying flat, and worsens with exertion. No alleviating factors. He denies h/o similar sx. No CP, diaphoresis, or palpitations.  Past Medical History  Diagnosis Date  . Diabetes mellitus   . Hyperlipidemia   . Hypertension   . Chronic back pain   . Renal disorder   . Aortic stenosis   . Anemia   . Atrial fibrillation   . Myocardial infarction   . Peripheral vascular disease   . Pneumonia   . Arthritis   . Claustrophobia    Past Surgical History  Procedure Laterality Date  . Cholecystectomy    . Esophagogastroduodenoscopy  08/01/2012    Procedure: ESOPHAGOGASTRODUODENOSCOPY (EGD);  Surgeon: [name], MD;  Location: [facility];  Service: Endoscopy;  Laterality: N/A;  . Cardiac catheterization      November 2015  . Tonsillectomy    . Colonoscopy  w/ biopsies and polypectomy    . Av fistula placement Left 10/19/2014    Procedure: BRACHIOCEPHALIC ARTERIOVENOUS (AV) FISTULA CREATION ;  Surgeon: Conrad [place], MD;  Location: [facility];  Service: Vascular;  Laterality: Left;  . Left and right heart catheterization with coronary angiogram N/A 09/30/2014    Procedure: LEFT AND RIGHT HEART CATHETERIZATION WITH CORONARY ANGIOGRAM;  Surgeon: [name], MD;  Location: [facility];  Service: Cardiovascular;  Laterality: N/A;   Family History  Problem Relation Age of Onset  . Diabetes Father   . Heart disease Father   . Diabetes Sister    History  Substance Use Topics  . Smoking status: Former Smoker -- 2.00 packs/day for 32 years    Quit date: 11/27/1983  . Smokeless tobacco: Never Used  . Alcohol Use: No     Comment: occasional beer    Review of Systems  Constitutional: Positive for fatigue. Negative for fever and chills.  HENT: Negative for congestion, rhinorrhea and sore throat.   Eyes: Negative for visual disturbance.  Respiratory: Positive for shortness of breath. Negative for cough and wheezing.   Gastrointestinal: Positive for nausea and vomiting.  Musculoskeletal: Negative for neck pain.  Skin: Negative for rash and wound.  Neurological: Negative for dizziness, syncope and headaches.  Psychiatric/Behavioral: Positive for confusion.      Allergies  Byetta 10 mcg pen  Home Medications   Prior to Admission medications   Medication  Sig Start Date End Date Taking? Authorizing Provider  amLODipine (NORVASC) 10 MG tablet Take 10 mg by mouth daily.      Historical Provider, MD  aspirin 81 MG chewable tablet Chew 81 mg by mouth daily.    Historical Provider, MD  calcitRIOL (ROCALTROL) 0.25 MCG capsule Take 0.25 mcg by mouth daily. 09/02/14   Historical Provider, MD  cloNIDine (CATAPRES) 0.1 MG tablet  08/30/14   Historical Provider, MD  furosemide (LASIX) 80 MG tablet Take 1 tablet (80 mg total) by mouth 2 (two) times daily.  10/02/14   Theodis Blaze, MD  hydrALAZINE (APRESOLINE) 100 MG tablet  11/18/14   Historical Provider, MD  hydrALAZINE (APRESOLINE) 50 MG tablet Take 1 tablet (50 mg total) by mouth every 6 (six) hours. 10/02/14   Theodis Blaze, MD  Insulin Detemir (LEVEMIR) 100 UNIT/ML Pen Inject 35 Units into the skin at bedtime. 06/11/14   Historical Provider, MD  lisinopril (PRINIVIL,ZESTRIL) 40 MG tablet  09/27/14   Historical Provider, MD  nitroGLYCERIN (NITROSTAT) 0.4 MG SL tablet Take 0.4 mg by mouth as needed. 10/16/13   Historical Provider, MD  oxyCODONE-acetaminophen (ROXICET) 5-325 MG per tablet Take 1 tablet by mouth every 6 (six) hours as needed for severe pain. 10/19/14   Alvia Grove, PA-C  simvastatin (ZOCOR) 20 MG tablet Take 1 tablet (20 mg total) by mouth at bedtime. 10/02/14   Theodis Blaze, MD   BP 119/47 mmHg  Pulse 42  Temp(Src) 97.4 F (36.3 C) (Oral)  Resp 16  SpO2 94% Physical Exam  Constitutional: He is oriented to person, place, and time. He has a sickly appearance. He appears ill.  HENT:  Head: Normocephalic and atraumatic.  Mouth/Throat: No oropharyngeal exudate.  Eyes: Conjunctivae are normal. Pupils are equal, round, and reactive to light.  Neck: Neck supple.  Cardiovascular: Normal heart sounds and intact distal pulses.  Bradycardia present.  Exam reveals no friction rub.   No murmur heard. Pulmonary/Chest: Effort normal. No respiratory distress. He has no wheezes. He has rales (bibasilar to mid lung fields).  Abdominal: Soft. Bowel sounds are normal. He exhibits no distension. There is no tenderness.  Musculoskeletal: He exhibits edema.  Neurological: He is alert and oriented to person, place, and time.  Skin: Skin is warm. No rash noted.  Nursing note and vitals reviewed.   ED Course  Procedures (including critical care time) Labs Review Labs Reviewed  CBC - Abnormal; Notable for the following:    RBC 2.94 (*)    Hemoglobin 8.6 (*)    HCT 25.7 (*)    All other  components within normal limits  COMPREHENSIVE METABOLIC PANEL - Abnormal; Notable for the following:    CO2 14 (*)    BUN 154 (*)    Creatinine, Ser 10.88 (*)    Calcium 8.3 (*)    Albumin 3.3 (*)    GFR calc non Af Amer 4 (*)    GFR calc Af Amer 5 (*)    Anion gap 19 (*)    All other components within normal limits  MAGNESIUM - Abnormal; Notable for the following:    Magnesium 2.9 (*)    All other components within normal limits  BRAIN NATRIURETIC PEPTIDE - Abnormal; Notable for the following:    B Natriuretic Peptide 679.8 (*)    All other components within normal limits  CBC WITH DIFFERENTIAL/PLATELET - Abnormal; Notable for the following:    RBC 2.85 (*)    Hemoglobin 8.3 (*)  HCT 24.8 (*)    All other components within normal limits  COMPREHENSIVE METABOLIC PANEL - Abnormal; Notable for the following:    Potassium 5.4 (*)    CO2 16 (*)    BUN 154 (*)    Creatinine, Ser 10.84 (*)    Calcium 8.3 (*)    Albumin 3.4 (*)    GFR calc non Af Amer 4 (*)    GFR calc Af Amer 5 (*)    Anion gap 17 (*)    All other components within normal limits  APTT - Abnormal; Notable for the following:    aPTT 41 (*)    All other components within normal limits  PROTIME-INR - Abnormal; Notable for the following:    Prothrombin Time 16.7 (*)    All other components within normal limits  BASIC METABOLIC PANEL - Abnormal; Notable for the following:    Potassium 2.0 (*)    BUN <5 (*)    Calcium 7.7 (*)    GFR calc non Af Amer 84 (*)    All other components within normal limits  I-STAT CHEM 8, ED - Abnormal; Notable for the following:    BUN 139 (*)    Creatinine, Ser 10.80 (*)    Glucose, Bld 100 (*)    Calcium, Ion 1.03 (*)    Hemoglobin 9.5 (*)    HCT 28.0 (*)    All other components within normal limits  MRSA PCR SCREENING  TSH  LACTIC ACID, PLASMA  GLUCOSE, CAPILLARY  CBC  BASIC METABOLIC PANEL  MAGNESIUM  PHOSPHORUS  HEPATITIS B SURFACE ANTIGEN  HEPATITIS B SURFACE  ANTIBODY, QUANTITATIVE  HEPATITIS B CORE ANTIBODY, TOTAL  I-STAT TROPOININ, ED  I-STAT CG4 LACTIC ACID, ED  I-STAT TROPOININ, ED  I-STAT CG4 LACTIC ACID, ED  POCT I-STAT TROPONIN I  I-STAT TROPOININ, ED    Imaging Review No results found.   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 See HPI above. On arrival, T 97.4, HR 35-45, with RR 17, BP 119/47, satting 94% on RA. ECG immediately obtained confirming 3rd degree/complete heart block. Pt placed on pacer pads with atropine at bedside. At this time, pt mentating well and maintaining pressures - will monitor closely with pacing/atropine as indicated. At this time, primary considerations include metabolic derangement 2/2 ESRD versus occlusion of collaterals identified on LHC in 09/2014 with subsequent sinus node dysfunction/SSS. Will consult Cardiology for pacer placement/evaluation, as well as Nephrology and Critical Care.  CBC with no leukocytosis, Hgb 8.6 likely 2/2 ESRD. CMP with K 5.1 but acidosis with Co2 14 and BUN 154, c/w AoCKD and now ESRD. Lactate WNL and troponin negative, sugesting primary metabolic component. CXR shows fluid overload with pulmonary edema. Discussed with Cardiology, who will place temp PM  to support pt through dialysis. Dw Nephrology who will dialyze in unit. ICU in agreement.  Will admit to ICU for CHB 2/2 metabolic derangements 2/2 ESRD. Pt remains bradycardic but presures stable and mentating well.  Clinical Impression: 1. Third degree heart block   2. Metabolic acidosis   3. Uremia   4. Hypoxemia     Disposition: Admit  Condition: Critical  Pt seen in conjunction with Dr. Cindi Carbon, MD 01/11/15 2121

## 2015-01-11 NOTE — Progress Notes (Signed)
After reviewing BMP from 1700 multiple changes in labs, spoke with e-link orders given to have redraw for BMP, report given to night shift nurse and to follow lab levels.

## 2015-01-11 NOTE — Procedures (Signed)
Patient was seen on dialysis and the procedure was supervised.  BFR 200  Via AVF-small needles BP is  120/36.   Patient appears to be tolerating treatment well  Jeffrey [name] A 01/11/2015

## 2015-01-11 NOTE — Consult Note (Addendum)
ELECTROPHYSIOLOGY CONSULT NOTE    Patient ID: Jeffrey [name] MRN: [id number], DOB/AGE: Mar 28, 1941 74 y.o.  Admit date: 01/11/2015 Date of Consult: 01/11/2015  Primary Physician: Thurman Coyer, MD Primary Cardiologist: Claiborne Billings  Reason for Consultation: heart block  HPI:  Jeffrey [name] is a 74 y.o. male with a past medical history significant for ESRD (due to start HD today - LUE AV fistula recently placed), diabetes, hyperlipidemia, hypertension, aortic stenosis, CAD (chronically occluded RCA with collaterals), and complete heart block.    He carries a diagnosis of atrial fibrillation but this is not well documented.  He has not been on anticoagulation.   He was admitted in November of 2015 with chest pain and heart block. His AVN blocking agents were discontinued and his conduction improved.  Catheterization demonstrated chronically occluded proximal to mid RCA with extensive left to right collaterals.  He subsequently had dialysis access placed in left upper extremity.  He was scheduled to begin dialysis today but has had a week long progressive fatigue and dizziness.  His family brought him to the ER for further evaluation.  He was found to be in AV  block with a ventricular escape in the 30's with a RBBB.    He has been on no AV nodal blocking agents since discharge 09/2014.    Last echo 09/2014 demonstrated EF of 65-70%, no RWMA, severe AS, LA 46.   Plan today is to place temp wire and dialyze.   EP has been asked to evaluate for treatment options.  Past Medical History  Diagnosis Date  . Diabetes mellitus   . Hyperlipidemia   . Hypertension   . Chronic back pain   . End stage renal disease     a. on dialysis, LUE fistula  . Aortic stenosis     a. severe by echo 09/2014  . Anemia   . Atrial fibrillation     a. not well documented, not on anticoagulation  . Coronary artery disease     a. chronically occluded RCA per cath 09/2014 with collaterals  . Peripheral  vascular disease   . Arthritis   . Claustrophobia   . Complete heart block     a. 11/205 resolved off AVN blocking agents b. recurred 12/2014  . CVA (cerebral infarction)     a.  no deficit      Surgical History:  Past Surgical History  Procedure Laterality Date  . Cholecystectomy    . Esophagogastroduodenoscopy  08/01/2012    Procedure: ESOPHAGOGASTRODUODENOSCOPY (EGD);  Surgeon: [name], MD;  Location: [facility];  Service: Endoscopy;  Laterality: N/A;  . Tonsillectomy    . Colonoscopy w/ biopsies and polypectomy    . Av fistula placement Left 10/19/2014    Procedure: BRACHIOCEPHALIC ARTERIOVENOUS (AV) FISTULA CREATION ;  Surgeon: Conrad Park Hills, MD;  Location: [facility];  Service: Vascular;  Laterality: Left;  . Left and right heart catheterization with coronary angiogram N/A 09/30/2014    Procedure: LEFT AND RIGHT HEART CATHETERIZATION WITH CORONARY ANGIOGRAM;  Surgeon: [name], MD;  Location: [facility] CATH LAB;  Service: Cardiovascular;  Laterality: N/A;      Current facility-administered medications:  .  atropine 0.1 MG/ML injection, , , ,   Current outpatient prescriptions:  .  amLODipine (NORVASC) 10 MG tablet, Take 10 mg by mouth daily.  , Disp: , Rfl:  .  aspirin 81 MG chewable tablet, Chew 81 mg by mouth daily., Disp: , Rfl:  .  calcitRIOL (ROCALTROL) 0.25  MCG capsule, Take 0.25 mcg by mouth daily., Disp: , Rfl: 6 .  furosemide (LASIX) 80 MG tablet, Take 1 tablet (80 mg total) by mouth 2 (two) times daily., Disp: 60 tablet, Rfl: 1 .  hydrALAZINE (APRESOLINE) 50 MG tablet, Take 1 tablet (50 mg total) by mouth every 6 (six) hours. (Patient taking differently: Take 50 mg by mouth 3 (three) times daily. ), Disp: 120 tablet, Rfl: 1 .  Insulin Detemir (LEVEMIR) 100 UNIT/ML Pen, INJECT 35 UNITS INTO THE SKIN AT BEDTIME., Disp: , Rfl:  .  nitroGLYCERIN (NITROSTAT) 0.4 MG SL tablet, Take 0.4 mg by mouth as needed., Disp: , Rfl:  .  simvastatin (ZOCOR) 20 MG tablet, Take 1 tablet  (20 mg total) by mouth at bedtime., Disp: 30 tablet, Rfl: 1 .  oxyCODONE-acetaminophen (ROXICET) 5-325 MG per tablet, Take 1 tablet by mouth every 6 (six) hours as needed for severe pain. (Patient not taking: Reported on 01/11/2015), Disp: 15 tablet, Rfl: 0  Allergies:  Allergies  Allergen Reactions  . Byetta 10 Mcg Pen [Exenatide] Nausea And Vomiting    History   Social History  . Marital Status: Married    Spouse Name: N/A  . Number of Children: N/A  . Years of Education: N/A   Occupational History  . Not on file.   Social History Main Topics  . Smoking status: Former Smoker -- 2.00 packs/day for 32 years    Quit date: 11/27/1983  . Smokeless tobacco: Never Used  . Alcohol Use: No     Comment: occasional beer  . Drug Use: No  . Sexual Activity: Not on file   Other Topics Concern  . Not on file   Social History Narrative     Family History  Problem Relation Age of Onset  . Diabetes Father   . Heart disease Father   . Diabetes Sister     BP 121/38 mmHg  Pulse 38  Temp(Src) 97.4 F (36.3 C) (Oral)  Resp 12  SpO2 99%  Physical Exam: Filed Vitals:   01/11/15 1400 01/11/15 1410 01/11/15 1415 01/11/15 1430  BP:  112/28 111/59 107/36  Pulse:  59 78 63  Temp: 97.5 F (36.4 C) 97.5 F (36.4 C)    TempSrc: Oral Oral    Resp:  20 22 15   Weight:  234 lb 5.6 oz (106.3 kg)    SpO2:  95% 96% 94%    GEN- The patient is elderly and chronically ill appearing, sleeping but rouses.  Currently receiving dialysis Head- normocephalic, atraumatic Eyes-  Sclera clear, conjunctiva pink Ears- hearing intact Oropharynx- clear Neck- supple Lungs- Clear to ausculation bilaterally, normal work of breathing Heart- irregular bradycardic rhythm, 2/6 SEM LUSB GI- soft, NT, ND, + BS Extremities- no clubbing, cyanosis, + dependant edema  MS- age appropriate atrophy Skin- no rash or lesion Psych- euthymic mood, full affect Neuro- currently receiving HD, unable to  assess    Labs:   Lab Results  Component Value Date   WBC 5.2 01/11/2015   HGB 9.5* 01/11/2015   HCT 28.0* 01/11/2015   MCV 87.4 01/11/2015   PLT 234 01/11/2015     Recent Labs Lab 01/11/15 1000 01/11/15 1013  NA 137 137  K 5.1 5.0  CL 104 105  CO2 14*  --   BUN 154* 139*  CREATININE 10.88* 10.80*  CALCIUM 8.3*  --   PROT 6.1  --   BILITOT 0.8  --   ALKPHOS 77  --   ALT 8  --  AST 9  --   GLUCOSE 98 100*    Radiology/Studies: Dg Chest Portable 1 View 01/11/2015   CLINICAL DATA:  74 year old male with shortness of breath and chest pain. Vomiting. Initial encounter.  EXAM: PORTABLE CHEST - 1 VIEW  COMPARISON:  09/28/2014 and earlier.  FINDINGS: Portable AP upright view at 1027 hours. Increased interstitial opacity and indistinctness of pulmonary vasculature. Lower lung volumes. Stable cardiomegaly and mediastinal contours. no pneumothorax. Mild veiling bibasilar opacity. No consolidation identified. Cardiac monitor or resuscitation pads project over the chest.  IMPRESSION: Abnormal pulmonary opacity most suggestive of acute pulmonary edema, likely with small pleural effusions.   Electronically Signed   By: Genevie Ann M.D.   On: 01/11/2015 10:40    EKG: sinus brady w Mobitz I heart block,ventricular rate 42, RBBB  TELEMETRY: Mobitz I heart block with ventricular rate in the 40's  A/P  1. Mobitz I second degree AV block The patient presents with fatigue and lethagy in the setting of second degree AV block and advanced renal failure.  Though his current metabolic derangement may be the cause for his symptoms, I also worry that he has advanced conduction system disease as evident by long first degree AV block, RBBB, and LPHB.  Severe AS frequently accompanies degenerative conduction system disease. He is presently receiving dialysis and appears stable.  At this time, I doubt that he needs a temporary pacing wire, but would make sure he has Zoll Pads on.  Continue to monitor/  assess post dialysis today.  IF he remains bradycardic and conduction does not improve then I would favor PPM implantation.  Pt is clinically quite ill.   A high level of decision making is required for management of this patient.  He is at risk of decompensation/ worsening conduction.  Would keep in CCU for now.   EP to follow.

## 2015-01-11 NOTE — ED Notes (Signed)
Pt resting at this time, reports it is hard to breathe. Pt's O2 increased to 3L/min. Pt reports that feels better.

## 2015-01-11 NOTE — ED Notes (Signed)
Pt noted to be in complete heart block. Pt placed on zoll pads, atropine at bedside. ED resident at bedside at this time.

## 2015-01-11 NOTE — Consult Note (Signed)
[facility] Renal Consultation Note  Requesting MD: Ward- ER Indication for Consultation: uremic symptoms  HPI:  Jeffrey [name] is a 74 y.o. male with past medical history significant for hypertension, diabetes mellitus, hyperlipidemia, coronary artery disease, aortic stenosis as well as peripheral vascular disease. He also has significant chronic kidney disease followed by Dr. Mercy Moore at [facility]. He is status post fistula placement in November 2015. Patient was seen by Dr. Mercy Moore on January 29 at which time patient was doing reasonably well and no plans were in place to initiate dialysis. He contacted our office recently when he started to have uremic symptoms. These have progressed fairly rapidly. He reports a been going on for about a week. He reports not being able to eat with dry heaves and really having a mobility issue as well today. Arrangements have been made for him to start at the [facility] and he was due to start tomorrow. He came to our office today to get some preliminary labs drawn and basically felt terrible so was sent here to the emergency department. He is found to be in complete heart block with initial heart rates in the 30s after atropine in the 50s. Cardiology is seen and wants to place a temporary pacer wire. Patient continues to complain of of basically uremic symptoms. BUN is over 130. He understands that it is time to initiate dialysis and he is ready.  CREATININE, SER  Date/Time Value Ref Range Status  01/11/2015 10:13 AM 10.80* 0.50 - 1.35 mg/dL Final  01/11/2015 10:00 AM 10.88* 0.50 - 1.35 mg/dL Final  10/02/2014 04:30 AM 5.37* 0.50 - 1.35 mg/dL Final  10/01/2014 02:26 AM 5.15* 0.50 - 1.35 mg/dL Final  09/30/2014 08:01 AM 5.67* 0.50 - 1.35 mg/dL Final  09/29/2014 06:50 AM 5.18* 0.50 - 1.35 mg/dL Final  09/28/2014 09:15 PM 5.15* 0.50 - 1.35 mg/dL Final  08/06/2011 03:05 PM 1.26 0.50 - 1.35 mg/dL Final  01/15/2009  06:05 AM 1.44 0.4 - 1.5 mg/dL Final  01/14/2009 04:30 AM 1.73* 0.4 - 1.5 mg/dL Final  01/13/2009 10:55 PM 1.8* 0.4 - 1.5 mg/dL Final  08/28/2007 11:56 AM 1.12  Final     PMHx:   Past Medical History  Diagnosis Date  . Diabetes mellitus   . Hyperlipidemia   . Hypertension   . Chronic back pain   . End stage renal disease     a. on dialysis, LUE fistula  . Aortic stenosis     a. severe by echo 09/2014  . Anemia   . Atrial fibrillation     a. not well documented, not on anticoagulation  . Coronary artery disease     a. chronically occluded RCA per cath 09/2014 with collaterals  . Peripheral vascular disease   . Arthritis   . Claustrophobia   . Complete heart block     a. 11/205 resolved off AVN blocking agents b. recurred 12/2014  . CVA (cerebral infarction)     a.  no deficit     Past Surgical History  Procedure Laterality Date  . Cholecystectomy    . Esophagogastroduodenoscopy  08/01/2012    Procedure: ESOPHAGOGASTRODUODENOSCOPY (EGD);  Surgeon: [name], MD;  Location: [facility];  Service: Endoscopy;  Laterality: N/A;  . Tonsillectomy    . Colonoscopy w/ biopsies and polypectomy    . Av fistula placement Left 10/19/2014    Procedure: BRACHIOCEPHALIC ARTERIOVENOUS (AV) FISTULA CREATION ;  Surgeon: Conrad Beaver, MD;  Location: [place];  Service: Vascular;  Laterality: Left;  . Left and right heart catheterization with coronary angiogram N/A 09/30/2014    Procedure: LEFT AND RIGHT HEART CATHETERIZATION WITH CORONARY ANGIOGRAM;  Surgeon: [name], MD;  Location: [facility] CATH LAB;  Service: Cardiovascular;  Laterality: N/A;    Family Hx:  Family History  Problem Relation Age of Onset  . Diabetes Father   . Heart disease Father   . Diabetes Sister     Social History:  reports that he quit smoking about 31 years ago. He has never used smokeless tobacco. He reports that he does not drink alcohol or use illicit drugs.  Allergies:  Allergies  Allergen Reactions  .  Byetta 10 Mcg Pen [Exenatide] Nausea And Vomiting    Medications: Prior to Admission medications   Medication Sig Start Date End Date Taking? Authorizing Provider  amLODipine (NORVASC) 10 MG tablet Take 10 mg by mouth daily.     Yes Historical Provider, MD  aspirin 81 MG chewable tablet Chew 81 mg by mouth daily.   Yes Historical Provider, MD  calcitRIOL (ROCALTROL) 0.25 MCG capsule Take 0.25 mcg by mouth daily. 09/02/14  Yes Historical Provider, MD  furosemide (LASIX) 80 MG tablet Take 1 tablet (80 mg total) by mouth 2 (two) times daily. 10/02/14  Yes Theodis Blaze, MD  hydrALAZINE (APRESOLINE) 50 MG tablet Take 1 tablet (50 mg total) by mouth every 6 (six) hours. Patient taking differently: Take 50 mg by mouth 3 (three) times daily.  10/02/14  Yes Theodis Blaze, MD  Insulin Detemir (LEVEMIR) 100 UNIT/ML Pen INJECT 35 UNITS INTO THE SKIN AT BEDTIME. 12/21/14  Yes Historical Provider, MD  nitroGLYCERIN (NITROSTAT) 0.4 MG SL tablet Take 0.4 mg by mouth as needed. 10/16/13  Yes Historical Provider, MD  simvastatin (ZOCOR) 20 MG tablet Take 1 tablet (20 mg total) by mouth at bedtime. 10/02/14  Yes Theodis Blaze, MD  oxyCODONE-acetaminophen (ROXICET) 5-325 MG per tablet Take 1 tablet by mouth every 6 (six) hours as needed for severe pain. Patient not taking: Reported on 01/11/2015 10/19/14   Alvia Grove, PA-C    I have reviewed the patient's current medications.  Labs:  Results for orders placed or performed during the hospital encounter of 01/11/15 (from the past 48 hour(s))  CBC     Status: Abnormal   Collection Time: 01/11/15 10:00 AM  Result Value Ref Range   WBC 5.2 4.0 - 10.5 K/uL   RBC 2.94 (L) 4.22 - 5.81 MIL/uL   Hemoglobin 8.6 (L) 13.0 - 17.0 g/dL   HCT 25.7 (L) 39.0 - 52.0 %   MCV 87.4 78.0 - 100.0 fL   MCH 29.3 26.0 - 34.0 pg   MCHC 33.5 30.0 - 36.0 g/dL   RDW 14.1 11.5 - 15.5 %   Platelets 234 150 - 400 K/uL  Comprehensive metabolic panel     Status: Abnormal   Collection  Time: 01/11/15 10:00 AM  Result Value Ref Range   Sodium 137 135 - 145 mmol/L   Potassium 5.1 3.5 - 5.1 mmol/L   Chloride 104 96 - 112 mmol/L   CO2 14 (L) 19 - 32 mmol/L   Glucose, Bld 98 70 - 99 mg/dL   BUN 154 (H) 6 - 23 mg/dL   Creatinine, Ser 10.88 (H) 0.50 - 1.35 mg/dL   Calcium 8.3 (L) 8.4 - 10.5 mg/dL   Total Protein 6.1 6.0 - 8.3 g/dL   Albumin 3.3 (L) 3.5 - 5.2 g/dL  AST 9 0 - 37 U/L   ALT 8 0 - 53 U/L   Alkaline Phosphatase 77 39 - 117 U/L   Total Bilirubin 0.8 0.3 - 1.2 mg/dL   GFR calc non Af Amer 4 (L) >90 mL/min   GFR calc Af Amer 5 (L) >90 mL/min    Comment: (NOTE) The eGFR has been calculated using the CKD EPI equation. This calculation has not been validated in all clinical situations. eGFR's persistently <90 mL/min signify possible Chronic Kidney Disease.    Anion gap 19 (H) 5 - 15  Magnesium     Status: Abnormal   Collection Time: 01/11/15 10:00 AM  Result Value Ref Range   Magnesium 2.9 (H) 1.5 - 2.5 mg/dL  I-Stat Troponin, ED - 0, 3, 6 hours (not at [facility])     Status: None   Collection Time: 01/11/15 10:12 AM  Result Value Ref Range   Troponin i, poc 0.02 0.00 - 0.08 ng/mL   Comment 3            Comment: Due to the release kinetics of cTnI, a negative result within the first hours of the onset of symptoms does not rule out myocardial infarction with certainty. If myocardial infarction is still suspected, repeat the test at appropriate intervals.   I-Stat Chem 8, ED     Status: Abnormal   Collection Time: 01/11/15 10:13 AM  Result Value Ref Range   Sodium 137 135 - 145 mmol/L   Potassium 5.0 3.5 - 5.1 mmol/L   Chloride 105 96 - 112 mmol/L   BUN 139 (H) 6 - 23 mg/dL   Creatinine, Ser 10.80 (H) 0.50 - 1.35 mg/dL   Glucose, Bld 100 (H) 70 - 99 mg/dL   Calcium, Ion 1.03 (L) 1.13 - 1.30 mmol/L   TCO2 14 0 - 100 mmol/L   Hemoglobin 9.5 (L) 13.0 - 17.0 g/dL   HCT 28.0 (L) 39.0 - 52.0 %  I-Stat CG4 Lactic Acid, ED     Status: None   Collection Time:  01/11/15 10:33 AM  Result Value Ref Range   Lactic Acid, Venous 0.86 0.5 - 2.0 mmol/L  Brain natriuretic peptide     Status: Abnormal   Collection Time: 01/11/15 10:48 AM  Result Value Ref Range   B Natriuretic Peptide 679.8 (H) 0.0 - 100.0 pg/mL  TSH     Status: None   Collection Time: 01/11/15 10:58 AM  Result Value Ref Range   TSH 3.687 0.350 - 4.500 uIU/mL     ROS:  A comprehensive review of systems was negative except for: Constitutional: positive for anorexia and fatigue Cardiovascular: positive for dyspnea and lower extremity edema Gastrointestinal: positive for nausea and vomiting being unable to walk  Physical Exam: Filed Vitals:   01/11/15 1215  BP: 128/42  Pulse: 52  Temp:   Resp: 11     General: Elderly appearing white male. He appears to be in mild distress secondary to uremic symptoms HEENT: Eyes are bloodshot. Mucous members are moist without lesion Neck: There is no jugular venous distention Heart: Bradycardic and irregular Lungs: Decreased breath sounds at the bases Abdomen: Soft, nontender, nondistended Extremities: Trace edema- there is a left upper arm AV fistula with good thrill and bruit Skin: Warm and dry Neuro: Alert and tell he is not feeling well  Assessment/Plan: 74 year old white male with multiple medical issues including advanced CKD now with uremic symptoms 1.Renal- patient with uremic symptoms is now time to initiate dialysis therapy. Patient  understands this. Will plan for a 2 hour treatment, gentle, using AV fistula later today. Patient  maybe needs to get pacing wire placed prior to receiving dialysis therapy?   He will get dialysis on Tuesday and Wednesday as well. It seems that he has an outpatient spot in [place] and that will just need to be clarified.  2. HTN/Vol-  - patient now with heart block and lowish blood pressure. He was on a number of antihypertensives in the outpatient setting including labetalol. They will be placed on hold  at this time.  We may need to do dialysis in the ICU 3. Anemia  - he has received iron as an outpatient. Hemoglobin is lower than what it has been. We will initiate ESA and check iron stores to see if he needs more iron 4. Secondary hyperparathyroidism was on calcitriol as an outpatient. Last PTH was actually good at 87. Will continue calcitriol here 5. Disposition will be based on how well he bounces back from this acute illness. Hopefully he would be able to return to his home living situation.   Thank you for the consult, will continue to follow with you    Kaidon Kinker A 01/11/2015, 12:30 PM

## 2015-01-11 NOTE — H&P (Signed)
PULMONARY / CRITICAL CARE MEDICINE   Name: Jeffrey [name] MRN: [id number] DOB: 1941/04/12    ADMISSION DATE:  01/11/2015 CONSULTATION DATE:  01/11/2015  REFERRING MD :  EDP  CHIEF COMPLAINT:  Complete heart block  INITIAL PRESENTATION: 74 year old male with history of stage 4 kidney disease presenting to the ED with the chief complaint of weakness.  Patient was attached to monitor where he was found to be in complete heart block.  Cardiology, renal and CC were consulted.  Patient was noted in renal failure with K of 5, HCO3 of 15, Cr 10.8 and BUN of 139.  STUDIES:  2/16 renal failure and complete heart block  SIGNIFICANT EVENTS: 2/16 first HD.  PAST MEDICAL HISTORY :   has a past medical history of Diabetes mellitus; Hyperlipidemia; Hypertension; Chronic back pain; End stage renal disease; Aortic stenosis; Anemia; Atrial fibrillation; Coronary artery disease; Peripheral vascular disease; Arthritis; Claustrophobia; Complete heart block; and CVA (cerebral infarction).  has past surgical history that includes Cholecystectomy; Esophagogastroduodenoscopy (08/01/2012); Tonsillectomy; Colonoscopy w/ biopsies and polypectomy; AV fistula placement (Left, 10/19/2014); and left and right heart catheterization with coronary angiogram (N/A, 09/30/2014). Prior to Admission medications   Medication Sig Start Date End Date Taking? Authorizing Provider  amLODipine (NORVASC) 10 MG tablet Take 10 mg by mouth daily.     Yes Historical Provider, MD  aspirin 81 MG chewable tablet Chew 81 mg by mouth daily.   Yes Historical Provider, MD  calcitRIOL (ROCALTROL) 0.25 MCG capsule Take 0.25 mcg by mouth daily. 09/02/14  Yes Historical Provider, MD  furosemide (LASIX) 80 MG tablet Take 1 tablet (80 mg total) by mouth 2 (two) times daily. 10/02/14  Yes Theodis Blaze, MD  hydrALAZINE (APRESOLINE) 50 MG tablet Take 1 tablet (50 mg total) by mouth every 6 (six) hours. Patient taking differently: Take 50 mg by mouth 3  (three) times daily.  10/02/14  Yes Theodis Blaze, MD  Insulin Detemir (LEVEMIR) 100 UNIT/ML Pen INJECT 35 UNITS INTO THE SKIN AT BEDTIME. 12/21/14  Yes Historical Provider, MD  nitroGLYCERIN (NITROSTAT) 0.4 MG SL tablet Take 0.4 mg by mouth as needed. 10/16/13  Yes Historical Provider, MD  simvastatin (ZOCOR) 20 MG tablet Take 1 tablet (20 mg total) by mouth at bedtime. 10/02/14  Yes Theodis Blaze, MD  oxyCODONE-acetaminophen (ROXICET) 5-325 MG per tablet Take 1 tablet by mouth every 6 (six) hours as needed for severe pain. Patient not taking: Reported on 01/11/2015 10/19/14   Alvia Grove, PA-C   Allergies  Allergen Reactions  . Byetta 10 Mcg Pen [Exenatide] Nausea And Vomiting    FAMILY HISTORY:  has no family status information on file.  SOCIAL HISTORY:  reports that he quit smoking about 31 years ago. He has never used smokeless tobacco. He reports that he does not drink alcohol or use illicit drugs.  REVIEW OF SYSTEMS:  Positive for feeling tired but no other complaints.  12 point ROS is negative other above.  SUBJECTIVE:   VITAL SIGNS: Temp:  [97.4 F (36.3 C)] 97.4 F (36.3 C) (02/16 1003) Pulse Rate:  [33-97] 33 (02/16 1230) Resp:  [11-19] 13 (02/16 1230) BP: (115-129)/(38-47) 115/42 mmHg (02/16 1230) SpO2:  [94 %-100 %] 99 % (02/16 1230) HEMODYNAMICS:   VENTILATOR SETTINGS:  2L [place] INTAKE / OUTPUT: No intake or output data in the 24 hours ending 01/11/15 1249  PHYSICAL EXAMINATION: General:  Chronically ill appearing male, NAD Neuro:  Alert and oriented, moving all ext to  command. HEENT:  [place]/AT, PERRL, EOM-I and MMM. Cardiovascular:  IRIR, brady, Nl S1/S2, -M/R/G. Lungs:  Diffuse crackles. Abdomen:  Soft, NT, ND and +BS. Musculoskeletal:  +2 edema bilaterally. Skin:  Intact.  LABS:  CBC  Recent Labs Lab 01/11/15 1000 01/11/15 1013  WBC 5.2  --   HGB 8.6* 9.5*  HCT 25.7* 28.0*  PLT 234  --    Coag's No results for input(s): APTT, INR in the last 168  hours. BMET  Recent Labs Lab 01/11/15 1000 01/11/15 1013  NA 137 137  K 5.1 5.0  CL 104 105  CO2 14*  --   BUN 154* 139*  CREATININE 10.88* 10.80*  GLUCOSE 98 100*   Electrolytes  Recent Labs Lab 01/11/15 1000  CALCIUM 8.3*  MG 2.9*   Sepsis Markers  Recent Labs Lab 01/11/15 1033  LATICACIDVEN 0.86   ABG No results for input(s): PHART, PCO2ART, PO2ART in the last 168 hours. Liver Enzymes  Recent Labs Lab 01/11/15 1000  AST 9  ALT 8  ALKPHOS 77  BILITOT 0.8  ALBUMIN 3.3*   Cardiac Enzymes No results for input(s): TROPONINI, PROBNP in the last 168 hours. Glucose No results for input(s): GLUCAP in the last 168 hours.  Imaging No results found.   ASSESSMENT / PLAN:  PULMONARY OETT None A: Hypoxemia due to pulmonary edema. P:   - Titrate O2 for sats. - Monitor for airway protection. - HD.  CARDIOVASCULAR CVL None Left AV graft. A: Complete heart block, likely related to metabolic disarray. P:  - Calcium x1 now. - HD. - External pacer in place. - Tele monitoring. - Cardiology following.  RENAL A:  ESRD at this point.  Hyperkalemia and acidosis. P:   - Renal consult appreciated. - HD in the ICU today.  GASTROINTESTINAL A:  No active issues. P:   - Renal diet. - Replace electrolytes as indicated. - BMET in AM.  HEMATOLOGIC A:  No active issues. P:  - Monitor.  INFECTIOUS A:  No active issues. P:   - Monitor off abx.  ENDOCRINE A:  DM.   P:   - ISS.  NEUROLOGIC A:  Tired, likely related to low HR and marginal BP. P:   - Hold all anti-HTN and sedating medications. - Monitor post dialysis.  FAMILY  - Updates: Family updated at length bedside.  TODAY'S SUMMARY: 43, ESRD now, complete heart block, admit to ICU and HD today.  The patient is critically ill with multiple organ systems failure and requires high complexity decision making for assessment and support, frequent evaluation and titration of therapies,  application of advanced monitoring technologies and extensive interpretation of multiple databases.   Critical Care Time devoted to patient care services described in this note is  35  Minutes. This time reflects time of care of this signee Dr Jennet Maduro. This critical care time does not reflect procedure time, or teaching time or supervisory time of PA/NP/Med student/Med Resident etc but could involve care discussion time.  Rush Farmer, M.D. [facility] Pulmonary/Critical Care Medicine. Pager: [phone]. After hours pager: [phone].  01/11/2015, 12:49 PM

## 2015-01-11 NOTE — ED Notes (Signed)
Critical care at bedside  

## 2015-01-11 NOTE — ED Provider Notes (Addendum)
I saw and evaluated the patient, reviewed the resident's note and I agree with the findings and plan.   EKG Interpretation   Date/Time:  Tuesday January 11 2015 09:59:58 EST Ventricular Rate:  45 PR Interval:    QRS Duration: 169 QT Interval:  536 QTC Calculation: 464 R Axis:   106 Text Interpretation:  Complete AV block with wide QRS complex Right bundle  branch block Confirmed by WARD,  DO, KRISTEN [id number]) on 01/11/2015 10:03:35  AM      Pt is a 74 y.o. male with history of end-stage renal disease who is scheduled to start dialysis tomorrow, hypertension, diabetes, hyperlipidemia, coronary artery disease status post cardiac catheterization in November 2015 which showed RCA disease with collateral flow. In November he was admitted for complete heart block thought secondary to nodal agents. Heart block resolved after stopping clonidine and beta blockers. He has not been on these medications since he comes in today with shortness of breath, generalized weakness and fatigue for the past week and is found to be in third-degree heart block. He is mentating normally and has a normal blood pressure. Labs show potassium of 5.0. Troponin negative.  Magnesium is elevated. He is uremic has a metabolic acidosis. Cardiology has seen the patient and will place a transvenous pacemaker as they would like for him to be dialyzed as they think that this may be more metabolic in nature causing his heart block and would like to hold off on permanent pacemaker at this time.  They will placed transvenous pacemaker when patient is upstairs.  Nephrology has also seen the patient. Critical care to admit.   CRITICAL CARE Performed by: Nyra Jabs   Total critical care time: 45 minutes  Critical care time was exclusive of separately billable procedures and treating other patients.  Critical care was necessary to treat or prevent imminent or life-threatening deterioration.  Critical care was time spent  personally by me on the following activities: development of treatment plan with patient and/or surrogate as well as nursing, discussions with consultants, evaluation of patient's response to treatment, examination of patient, obtaining history from patient or surrogate, ordering and performing treatments and interventions, ordering and review of laboratory studies, ordering and review of radiographic studies, pulse oximetry and re-evaluation of patient's condition.   [place], DO 01/11/15 1249    EKG Interpretation  Date/Time:  Tuesday January 11 2015 12:04:38 EST Ventricular Rate:  41 PR Interval:    QRS Duration: 168 QT Interval:  571 QTC Calculation: 472 R Axis:   106 Text Interpretation:  Incomplete analysis due to missing data in precordial lead(s) Junctional rhythm Right bundle branch block Baseline wander in lead(s) V2 Missing lead(s): V6 No significant change was found Confirmed by WARD,  DO, KRISTEN [phone]) on 01/11/2015 1:02:32 PM        [place], DO 01/11/15 1303

## 2015-01-11 NOTE — ED Notes (Signed)
Cardiology at bedside.

## 2015-01-11 NOTE — ED Notes (Signed)
MD at bedside. 

## 2015-01-11 NOTE — Consult Note (Signed)
CONSULT NOTE  Date: 01/11/2015               Patient Name:  Jeffrey [name] MRN: [id number]  DOB: 1941-10-20 Age / Sex: 74 y.o., male        PCP: Thurman Coyer Primary Cardiologist: Claiborne Billings             Referring Physician: Ward, ( [facility])               Reason for Consult: Complete heart block            History of Present Illness: Patient is a 74 y.o. male with a PMHx of complete heart block, aortic stenosis, CAD, CVA, ESRD , who was admitted to [facility] on 01/11/2015 for evaluation of  Generalized weakness,   He's had progressive renal dysufnction for months. This has progressed rapidly over the past several weeks and this week, he has spent his time in bed.    He was seen in the nephrology office , was found to have a slow HR and was sent to the ER.  [name]  Denies any chest pain   Medications: Outpatient medications:  (Not in a hospital admission)  Current medications: Current Facility-Administered Medications  Medication Dose Route Frequency Provider Last Rate Last Dose  . atropine 0.1 MG/ML injection            Current Outpatient Prescriptions  Medication Sig Dispense Refill  . amLODipine (NORVASC) 10 MG tablet Take 10 mg by mouth daily.      [name] aspirin 81 MG chewable tablet Chew 81 mg by mouth daily.    . calcitRIOL (ROCALTROL) 0.25 MCG capsule Take 0.25 mcg by mouth daily.  6  . cloNIDine (CATAPRES) 0.1 MG tablet     . furosemide (LASIX) 80 MG tablet Take 1 tablet (80 mg total) by mouth 2 (two) times daily. 60 tablet 1  . hydrALAZINE (APRESOLINE) 100 MG tablet     . hydrALAZINE (APRESOLINE) 50 MG tablet Take 1 tablet (50 mg total) by mouth every 6 (six) hours. 120 tablet 1  . Insulin Detemir (LEVEMIR) 100 UNIT/ML Pen Inject 35 Units into the skin at bedtime.    [name] lisinopril (PRINIVIL,ZESTRIL) 40 MG tablet     . nitroGLYCERIN (NITROSTAT) 0.4 MG SL tablet Take 0.4 mg by mouth as needed.    [name] oxyCODONE-acetaminophen (ROXICET) 5-325 MG per tablet Take 1 tablet  by mouth every 6 (six) hours as needed for severe pain. 15 tablet 0  . simvastatin (ZOCOR) 20 MG tablet Take 1 tablet (20 mg total) by mouth at bedtime. 30 tablet 1     Allergies  Allergen Reactions  . Byetta 10 Mcg Pen [Exenatide] Nausea And Vomiting     Past Medical History  Diagnosis Date  . Diabetes mellitus   . Hyperlipidemia   . Hypertension   . Chronic back pain   . Renal disorder   . Aortic stenosis   . Anemia   . Atrial fibrillation   . Myocardial infarction   . Peripheral vascular disease   . Pneumonia   . Arthritis   . Claustrophobia     Past Surgical History  Procedure Laterality Date  . Cholecystectomy    . Esophagogastroduodenoscopy  08/01/2012    Procedure: ESOPHAGOGASTRODUODENOSCOPY (EGD);  Surgeon: [name], MD;  Location: [facility];  Service: Endoscopy;  Laterality: N/A;  . Cardiac catheterization      November 2015  . Tonsillectomy    .  Colonoscopy w/ biopsies and polypectomy    . Av fistula placement Left 10/19/2014    Procedure: BRACHIOCEPHALIC ARTERIOVENOUS (AV) FISTULA CREATION ;  Surgeon: Conrad Hitchita, MD;  Location: [facility];  Service: Vascular;  Laterality: Left;  . Left and right heart catheterization with coronary angiogram N/A 09/30/2014    Procedure: LEFT AND RIGHT HEART CATHETERIZATION WITH CORONARY ANGIOGRAM;  Surgeon: [name], MD;  Location[facility] LAB;  Service: Cardiovascular;  Laterality: N/A;    Family History  Problem Relation Age of Onset  . Diabetes Father   . Heart disease Father   . Diabetes Sister     Social History:  reports that he quit smoking about 31 years ago. He has never used smokeless tobacco. He reports that he does not drink alcohol or use illicit drugs.   Review of Systems: Constitutional:  denies fever, chills, diaphoresis,  Has ahd significant appetite change and  fatigue.  HEENT: denies photophobia, eye pain, redness, hearing loss, ear pain, congestion, sore throat, rhinorrhea, sneezing, neck  pain, neck stiffness and tinnitus.  Respiratory: denies SOB, DOE, cough, chest tightness, and wheezing.  Cardiovascular: denies chest pain, palpitations and leg swelling.  Gastrointestinal: denies nausea, vomiting, abdominal pain, diarrhea, constipation, blood in stool.  Genitourinary: denies dysuria, urgency, frequency, hematuria, flank pain and difficulty urinating.  Musculoskeletal: denies  myalgias, back pain, joint swelling, arthralgias and gait problem.   Skin: denies pallor, rash and wound.  Neurological: denies dizziness, seizures, syncope, weakness, light-headedness, numbness and headaches.   Hematological: denies adenopathy, easy bruising, personal or family bleeding history.  Psychiatric/ Behavioral: denies suicidal ideation, mood changes, confusion, nervousness, sleep disturbance and agitation.    Physical Exam: BP 118/43 mmHg  Pulse 90  Temp(Src) 97.4 F (36.3 C) (Oral)  Resp 16  SpO2 97%  Wt Readings from Last 3 Encounters:  12/03/14 237 lb (107.502 kg)  10/19/14 242 lb (109.77 kg)  10/08/14 242 lb (109.77 kg)    General: Vital signs reviewed and noted. Elderly male, chronically ill appearing   Head: Normocephalic, atraumatic, sclera anicteric,   Neck: Supple. Negative for carotid bruits.   Lungs:  Clear bilaterally, no  wheezes, rales, or rhonchi. Breathing is normal   Heart: RR, bradycardia,  2/6 systolic murmur radiating to the URSB   Abdomen/ GI :  Soft, non-tender, non-distended with normoactive bowel sounds. No hepatomegaly. No rebound/guarding. No obvious abdominal masses  MSK: Strength and the appear normal for age.   Extremities: No clubbing or cyanosis. No edema.  Distal pedal pulses are 2+ and equal   Neurologic:  CN are grossly intact,  Slow to respond.   Psych: Slow to respond    Lab results: Basic Metabolic Panel:  Recent Labs Lab 01/11/15 1013  NA 137  K 5.0  CL 105  GLUCOSE 100*  BUN 139*  CREATININE 10.80*    Liver Function Tests: No  results for input(s): AST, ALT, ALKPHOS, BILITOT, PROT, ALBUMIN in the last 168 hours. No results for input(s): LIPASE, AMYLASE in the last 168 hours. No results for input(s): AMMONIA in the last 168 hours.  CBC:  Recent Labs Lab 01/11/15 1000 01/11/15 1013  WBC 5.2  --   HGB 8.6* 9.5*  HCT 25.7* 28.0*  MCV 87.4  --   PLT 234  --     Cardiac Enzymes: No results for input(s): CKTOTAL, CKMB, CKMBINDEX, TROPONINI in the last 168 hours.  BNP: Invalid input(s): POCBNP  CBG: No results for input(s): GLUCAP in the last  168 hours.  Coagulation Studies: No results for input(s): LABPROT, INR in the last 72 hours.   Other results:  Personal review of EKG shows :  NSR with complete AV block with HR in 30s.    Imaging: Dg Chest Portable 1 View  01/11/2015   CLINICAL DATA:  74 year old male with shortness of breath and chest pain. Vomiting. Initial encounter.  EXAM: PORTABLE CHEST - 1 VIEW  COMPARISON:  09/28/2014 and earlier.  FINDINGS: Portable AP upright view at 1027 hours. Increased interstitial opacity and indistinctness of pulmonary vasculature. Lower lung volumes. Stable cardiomegaly and mediastinal contours. no pneumothorax. Mild veiling bibasilar opacity. No consolidation identified. Cardiac monitor or resuscitation pads project over the chest.  IMPRESSION: Abnormal pulmonary opacity most suggestive of acute pulmonary edema, likely with small pleural effusions.   Electronically Signed   By: Genevie Ann M.D.   On: 01/11/2015 10:40       Assessment & Plan:  1. Complete AV block- his BP is fairly stable.  He has had documented heart block since Nov. 2015.   This appears to be worse.  May be related to his ESRD and related metabolic issues. Will place a temp pacing wire today so that he can be dialyzed.  Anticipate permanent pacer soon / tomorrow if he does not improve   2. Aortic stenosis - mean gradient of 43 at time of cath .  Echo shows normal LV systolic function with a mean  Aortic valve gradient of 41 mm Hg.   3. ESRD - needs to have dialysis.  Potassium is ok.    4. 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  He has extensive left -right collaterals  No angina .   5.      Thayer Headings, Brooke Bonito., MD, [facility] 01/11/2015, 11:04 AM Office - [phone] Pager [phone]

## 2015-01-11 NOTE — Progress Notes (Signed)
Pt first HD tx complete w/ no complications. 1 L removed. Pt alert, resting, no c/o.

## 2015-01-11 NOTE — ED Notes (Signed)
On arrival pt noted to have a HR of of 39, ED resident available and asked to come to bedside.

## 2015-01-12 DIAGNOSIS — N186 End stage renal disease: Secondary | ICD-10-CM

## 2015-01-12 DIAGNOSIS — Z992 Dependence on renal dialysis: Secondary | ICD-10-CM

## 2015-01-12 DIAGNOSIS — R0609 Other forms of dyspnea: Secondary | ICD-10-CM

## 2015-01-12 DIAGNOSIS — I959 Hypotension, unspecified: Secondary | ICD-10-CM

## 2015-01-12 DIAGNOSIS — I441 Atrioventricular block, second degree: Secondary | ICD-10-CM

## 2015-01-12 LAB — CBC
HEMATOCRIT: 23.9 % — AB (ref 39.0–52.0)
HEMOGLOBIN: 7.8 g/dL — AB (ref 13.0–17.0)
MCH: 28.9 pg (ref 26.0–34.0)
MCHC: 32.6 g/dL (ref 30.0–36.0)
MCV: 88.5 fL (ref 78.0–100.0)
Platelets: 191 10*3/uL (ref 150–400)
RBC: 2.7 MIL/uL — AB (ref 4.22–5.81)
RDW: 14.5 % (ref 11.5–15.5)
WBC: 5.6 10*3/uL (ref 4.0–10.5)

## 2015-01-12 LAB — BASIC METABOLIC PANEL
ANION GAP: 10 (ref 5–15)
Anion gap: 10 (ref 5–15)
BUN: 111 mg/dL — ABNORMAL HIGH (ref 6–23)
CHLORIDE: 96 mmol/L (ref 96–112)
CO2: 29 mmol/L (ref 19–32)
CO2: 22 mmol/L (ref 19–32)
CREATININE: 8.41 mg/dL — AB (ref 0.50–1.35)
Calcium: 7.7 mg/dL — ABNORMAL LOW (ref 8.4–10.5)
Calcium: 8.1 mg/dL — ABNORMAL LOW (ref 8.4–10.5)
Chloride: 107 mmol/L (ref 96–112)
Creatinine, Ser: 0.84 mg/dL (ref 0.50–1.35)
GFR calc Af Amer: 6 mL/min — ABNORMAL LOW (ref >90)
GFR, EST NON AFRICAN AMERICAN: 84 mL/min — AB (ref >90)
GFR, EST NON AFRICAN AMERICAN: 5 mL/min — AB (ref >90)
GLUCOSE: 89 mg/dL (ref 70–99)
Glucose, Bld: 89 mg/dL (ref 70–99)
Potassium: 2 mmol/L — CL (ref 3.5–5.1)
Potassium: 4.6 mmol/L (ref 3.5–5.1)
SODIUM: 135 mmol/L (ref 135–145)
SODIUM: 139 mmol/L (ref 135–145)

## 2015-01-12 LAB — HEPATITIS B SURFACE ANTIGEN: Hepatitis B Surface Ag: NEGATIVE

## 2015-01-12 LAB — GLUCOSE, CAPILLARY
GLUCOSE-CAPILLARY: 85 mg/dL (ref 70–99)
GLUCOSE-CAPILLARY: 163 mg/dL — AB (ref 70–99)
Glucose-Capillary: 94 mg/dL (ref 70–99)
Glucose-Capillary: 108 mg/dL — ABNORMAL HIGH (ref 70–99)
Glucose-Capillary: 169 mg/dL — ABNORMAL HIGH (ref 70–99)

## 2015-01-12 LAB — CLOSTRIDIUM DIFFICILE BY PCR: CDIFFPCR: NEGATIVE

## 2015-01-12 LAB — FERRITIN: FERRITIN: 189 ng/mL (ref 22–322)

## 2015-01-12 LAB — PHOSPHORUS: PHOSPHORUS: 8.4 mg/dL — AB (ref 2.3–4.6)

## 2015-01-12 LAB — MAGNESIUM: Magnesium: 2.5 mg/dL (ref 1.5–2.5)

## 2015-01-12 MED ORDER — CHLORHEXIDINE GLUCONATE 4 % EX LIQD
Freq: Once | CUTANEOUS | Status: AC
  Administered 2015-01-12: 21:00:00 via TOPICAL
  Filled 2015-01-12: qty 60

## 2015-01-12 MED ORDER — CHLORHEXIDINE GLUCONATE 4 % EX LIQD
CUTANEOUS | Status: AC
  Administered 2015-01-12: 4 via TOPICAL
  Filled 2015-01-12: qty 15

## 2015-01-12 MED ORDER — SODIUM CHLORIDE 0.9 % IV SOLN: INTRAVENOUS | Status: DC

## 2015-01-12 MED ORDER — INSULIN ASPART 100 UNIT/ML ~~LOC~~ SOLN: 0.0000 [IU] | Freq: Every day | SUBCUTANEOUS | Status: DC

## 2015-01-12 MED ORDER — CALCIUM ACETATE 667 MG PO CAPS
1334.0000 mg | ORAL_CAPSULE | Freq: Three times a day (TID) | ORAL | Status: DC
  Administered 2015-01-12 – 2015-01-15 (×6): 1334 mg via ORAL
  Filled 2015-01-12 (×13): qty 2

## 2015-01-12 MED ORDER — CHLORHEXIDINE GLUCONATE 4 % EX LIQD
Freq: Once | CUTANEOUS | Status: AC
  Administered 2015-01-13: 05:00:00 via TOPICAL
  Filled 2015-01-12: qty 60

## 2015-01-12 MED ORDER — CEFAZOLIN SODIUM-DEXTROSE 2-3 GM-% IV SOLR
2.0000 g | INTRAVENOUS | Status: AC
  Filled 2015-01-12: qty 50

## 2015-01-12 MED ORDER — HEPARIN SODIUM (PORCINE) 1000 UNIT/ML DIALYSIS
20.0000 [IU]/kg | INTRAMUSCULAR | Status: DC | PRN
  Filled 2015-01-12: qty 3

## 2015-01-12 MED ORDER — SODIUM CHLORIDE 0.9 % IR SOLN
80.0000 mg | Status: AC
  Filled 2015-01-12: qty 2

## 2015-01-12 MED ORDER — SODIUM CHLORIDE 0.9 % IV SOLN
INTRAVENOUS | Status: DC
  Administered 2015-01-13: 06:00:00 via INTRAVENOUS

## 2015-01-12 MED ORDER — NEPRO/CARBSTEADY PO LIQD
237.0000 mL | Freq: Two times a day (BID) | ORAL | Status: DC
  Administered 2015-01-12: 237 mL via ORAL
  Filled 2015-01-12 (×7): qty 237

## 2015-01-12 MED ORDER — INSULIN ASPART 100 UNIT/ML ~~LOC~~ SOLN
0.0000 [IU] | Freq: Three times a day (TID) | SUBCUTANEOUS | Status: DC
Start: 2015-01-13 — End: 2015-01-15
  Administered 2015-01-13 – 2015-01-14 (×2): 2 [IU] via SUBCUTANEOUS
  Administered 2015-01-14 – 2015-01-15 (×2): 3 [IU] via SUBCUTANEOUS

## 2015-01-12 MED ORDER — CHLORHEXIDINE GLUCONATE 4 % EX LIQD: 60.0000 mL | Freq: Once | CUTANEOUS | Status: AC

## 2015-01-12 MED ORDER — CHLORHEXIDINE GLUCONATE 4 % EX LIQD
60.0000 mL | Freq: Once | CUTANEOUS | Status: AC
  Administered 2015-01-12: 4 via TOPICAL

## 2015-01-12 MED ORDER — CHLORHEXIDINE GLUCONATE 4 % EX LIQD: Freq: Once | CUTANEOUS | Status: DC

## 2015-01-12 NOTE — Progress Notes (Signed)
PULMONARY / CRITICAL CARE MEDICINE   Name: Jeffrey [name] MRN: [id number] DOB: [birth year]    ADMISSION DATE:  01/11/2015 CONSULTATION DATE:  01/11/2015  REFERRING MD :  EDP  CHIEF COMPLAINT:  Complete heart block  INITIAL PRESENTATION: 74 year old male with history of stage 4 kidney disease presenting to the ED with the chief complaint of weakness.  Patient was attached to monitor where he was found to be in complete heart block.  Cardiology, renal and CC were consulted.  Patient was noted in renal failure with K of 5, HCO3 of 15, Cr 10.8 and BUN of 139.  STUDIES:  2/16 renal failure and complete heart block  SIGNIFICANT EVENTS: 2/16 first HD. 2/17 feels much better but remains in heart block.  SUBJECTIVE: HD with correction of electrolytes but remains in heart block, feels better this AM.  VITAL SIGNS: Temp:  [97.4 F (36.3 C)-98 F (36.7 C)] 97.8 F (36.6 C) (02/17 0800) Pulse Rate:  [26-122] 40 (02/17 0845) Resp:  [11-22] 16 (02/17 0845) BP: (106-146)/(20-59) 140/33 mmHg (02/17 0845) SpO2:  [90 %-100 %] 94 % (02/17 0845) Weight:  [104.2 kg (229 lb 11.5 oz)-106.3 kg (234 lb 5.6 oz)] 104.9 kg (231 lb 4.2 oz) (02/17 0715) HEMODYNAMICS:   VENTILATOR SETTINGS:  2L [place] INTAKE / OUTPUT:  Intake/Output Summary (Last 24 hours) at 01/12/15 0850 Last data filed at 01/12/15 0600  Gross per 24 hour  Intake  223.5 ml  Output   1500 ml  Net -1276.5 ml   PHYSICAL EXAMINATION: General:  Chronically ill appearing male, NAD Neuro:  Alert and oriented, moving all ext to command. HEENT:  [place]/AT, PERRL, EOM-I and MMM. Cardiovascular:  IRIR, brady, Nl S1/S2, -M/R/G. Lungs: Bibasilar crackles Abdomen:  Soft, NT, ND and +BS. Musculoskeletal:  +2 edema bilaterally. Skin:  Intact.  LABS:  CBC  Recent Labs Lab 01/11/15 1000 01/11/15 1013 01/11/15 1103 01/12/15 0306  WBC 5.2  --  5.2 5.6  HGB 8.6* 9.5* 8.3* 7.8*  HCT 25.7* 28.0* 24.8* 23.9*  PLT 234  --  233 191    Coag's  Recent Labs Lab 01/11/15 1103  APTT 41*  INR 1.33   BMET  Recent Labs Lab 01/11/15 1600 01/11/15 1922 01/12/15 0306  NA 135 139 139  K 2.0* 4.3 4.6  CL 96 105 107  CO2 29 25 22   BUN <5* 109* 111*  CREATININE 0.84 8.39* 8.41*  GLUCOSE 89 100* 89   Electrolytes  Recent Labs Lab 01/11/15 1000  01/11/15 1600 01/11/15 1922 01/12/15 0306  CALCIUM 8.3*  < > 7.7* 8.2* 8.1*  MG 2.9*  --   --   --  2.5  PHOS  --   --   --   --  8.4*  < > = values in this interval not displayed. Sepsis Markers  Recent Labs Lab 01/11/15 1033 01/11/15 1325 01/11/15 1332  LATICACIDVEN 0.86 0.8 0.60   ABG No results for input(s): PHART, PCO2ART, PO2ART in the last 168 hours. Liver Enzymes  Recent Labs Lab 01/11/15 1000 01/11/15 1103  AST 9 9  ALT 8 8  ALKPHOS 77 82  BILITOT 0.8 0.8  ALBUMIN 3.3* 3.4*   Cardiac Enzymes No results for input(s): TROPONINI, PROBNP in the last 168 hours. Glucose  Recent Labs Lab 01/11/15 1609 01/11/15 2036 01/11/15 2302 01/12/15 0306  GLUCAP 86 101* 93 85    Imaging Dg Chest Portable 1 View  01/11/2015   CLINICAL DATA:  74 year old male with shortness  of breath and chest pain. Vomiting. Initial encounter.  EXAM: PORTABLE CHEST - 1 VIEW  COMPARISON:  09/28/2014 and earlier.  FINDINGS: Portable AP upright view at 1027 hours. Increased interstitial opacity and indistinctness of pulmonary vasculature. Lower lung volumes. Stable cardiomegaly and mediastinal contours. no pneumothorax. Mild veiling bibasilar opacity. No consolidation identified. Cardiac monitor or resuscitation pads project over the chest.  IMPRESSION: Abnormal pulmonary opacity most suggestive of acute pulmonary edema, likely with small pleural effusions.   Electronically Signed   By: Genevie Ann M.D.   On: 01/11/2015 10:40   ASSESSMENT / PLAN:  PULMONARY OETT None A: Hypoxemia due to pulmonary edema. P:   - Titrate O2 for sats. - Monitor for airway protection. - HD  per renal.  CARDIOVASCULAR CVL None Left AV graft. A: Complete heart block, likely related to metabolic disarray. P:  - HD per renal. - External pacer in place. - Tele monitoring. - Spoke with EP, considering pacer, awaiting final decision however.  RENAL A:  ESRD at this point.  Hyperkalemia and acidosis. P:   - Renal consult appreciated. - HD again today. - Replace electrolytes as indicated.  GASTROINTESTINAL A:  No active issues. P:   - Renal diabetic diet. - BMET in AM.  HEMATOLOGIC A:  No active issues. P:  - Monitor.  INFECTIOUS A:  No active issues. P:   - Monitor off abx.  ENDOCRINE A:  DM.   P:   - ISS.  NEUROLOGIC A:  Tired, likely related to low HR and marginal BP. P:   - Hold all anti-HTN and sedating medications. - Monitor post dialysis.  FAMILY  - Updates: Patient updated at length bedside.  Given persistent heart block, HR in the 30's, EP to determine need for pacer.  Will hold in the ICU for close monitoring given cardiac rhythm.  The patient is critically ill with multiple organ systems failure and requires high complexity decision making for assessment and support, frequent evaluation and titration of therapies, application of advanced monitoring technologies and extensive interpretation of multiple databases.   Critical Care Time devoted to patient care services described in this note is  35  Minutes. This time reflects time of care of this signee Dr Jennet Maduro. This critical care time does not reflect procedure time, or teaching time or supervisory time of PA/NP/Med student/Med Resident etc but could involve care discussion time.  Rush Farmer, M.D. [facility] Pulmonary/Critical Care Medicine. Pager: [phone]. After hours pager: [phone].  01/12/2015, 8:50 AM

## 2015-01-12 NOTE — Progress Notes (Signed)
eLink Physician-Brief Progress Note Patient Name: LAVONTA BEERE DOB: 1941/08/16 MRN: [id number]   Date of Service  01/12/2015  HPI/Events of Note  Changed SSI to [facility] + qhs  eICU Interventions       Intervention Category Minor Interventions: Routine modifications to care plan (e.g. PRN medications for pain, fever)  Luxe Cuadros,Dequan S. 01/12/2015, 7:32 PM

## 2015-01-12 NOTE — Progress Notes (Signed)
Subjective:  Had first HD late yesterday afternoon, tolerated it well.  Says feels much better this AM- still appears to be in heart block  Objective Vital signs in last 24 hours: Filed Vitals:   01/12/15 0300 01/12/15 0400 01/12/15 0500 01/12/15 0600  BP: 112/43 121/34 140/39 130/29  Pulse: 39 33 33 33  Temp:  97.9 F (36.6 C)    TempSrc:  Oral    Resp: 13     Height:      Weight:   104.2 kg (229 lb 11.5 oz)   SpO2: 90% 92% 90% 92%   Weight change:   Intake/Output Summary (Last 24 hours) at 01/12/15 [id number] Last data filed at 01/12/15 0600  Gross per 24 hour  Intake  223.5 ml  Output   1500 ml  Net -1276.5 ml    Assessment/Plan: 74 year old white male with multiple medical issues including advanced CKD now with uremic symptoms 1.Renal- late stage 4 CKD/ now ESRD- patient with uremic symptoms now time to initiate dialysis therapy. Patient understands this. Had 2 hour treatment yest, tolerated well- planning for second treatment today. Patientmaybe needs to get pacing wire placed vs permanent pacer this hospitalization. He will get dialysis today and Wednesday as well. It seems that he has an outpatient spot in [place] verified upon discharge from the hospital 2. HTN/Vol- heart block - patient now with heart block and lowish blood pressure. He was on a number of antihypertensives in the outpatient setting including labetalol. They are on hold at this time.  3. Anemia - he has received iron as an outpatient. Hemoglobin is lower than what it has been. Have initiated ESA and check iron stores to see if he needs more iron 4. Secondary hyperparathyroidism was on calcitriol as an outpatient. Last PTH was actually good at 87. Will continue calcitriol here.  Needs a binder as well, will add phoslo 5. Disposition will be based on how well he bounces back from this acute illness. Hopefully he would be able to return to his home living situation.    Marlet Korte A    Labs: Basic  Metabolic Panel:  Recent Labs Lab 01/11/15 1600 01/11/15 1922 01/12/15 0306  NA 135 139 139  K 2.0* 4.3 4.6  CL 96 105 107  CO2 29 25 22   GLUCOSE 89 100* 89  BUN <5* 109* 111*  CREATININE 0.84 8.39* 8.41*  CALCIUM 7.7* 8.2* 8.1*  PHOS  --   --  8.4*   Liver Function Tests:  Recent Labs Lab 01/11/15 1000 01/11/15 1103  AST 9 9  ALT 8 8  ALKPHOS 77 82  BILITOT 0.8 0.8  PROT 6.1 6.0  ALBUMIN 3.3* 3.4*   No results for input(s): LIPASE, AMYLASE in the last 168 hours. No results for input(s): AMMONIA in the last 168 hours. CBC:  Recent Labs Lab 01/11/15 1000 01/11/15 1013 01/11/15 1103 01/12/15 0306  WBC 5.2  --  5.2 5.6  NEUTROABS  --   --  3.6  --   HGB 8.6* 9.5* 8.3* 7.8*  HCT 25.7* 28.0* 24.8* 23.9*  MCV 87.4  --  87.0 88.5  PLT 234  --  233 191   Cardiac Enzymes: No results for input(s): CKTOTAL, CKMB, CKMBINDEX, TROPONINI in the last 168 hours. CBG:  Recent Labs Lab 01/11/15 1609 01/11/15 2036 01/11/15 2302 01/12/15 0306  GLUCAP 86 101* 93 85    Iron Studies: No results for input(s): IRON, TIBC, TRANSFERRIN, FERRITIN in the last 72 hours. Studies/Results: Dg  Chest Portable 1 View  01/11/2015   CLINICAL DATA:  74 year old male with shortness of breath and chest pain. Vomiting. Initial encounter.  EXAM: PORTABLE CHEST - 1 VIEW  COMPARISON:  09/28/2014 and earlier.  FINDINGS: Portable AP upright view at 1027 hours. Increased interstitial opacity and indistinctness of pulmonary vasculature. Lower lung volumes. Stable cardiomegaly and mediastinal contours. no pneumothorax. Mild veiling bibasilar opacity. No consolidation identified. Cardiac monitor or resuscitation pads project over the chest.  IMPRESSION: Abnormal pulmonary opacity most suggestive of acute pulmonary edema, likely with small pleural effusions.   Electronically Signed   By: Genevie Ann M.D.   On: 01/11/2015 10:40   Medications: Infusions:    Scheduled Medications: . calcitRIOL  0.25 mcg  Oral Daily  . [START ON 01/18/2015] darbepoetin (ARANESP) injection - DIALYSIS  60 mcg Intravenous Q Tue-HD  . heparin subcutaneous  5,000 Units Subcutaneous 3 times per day  . insulin aspart  2-6 Units Subcutaneous 6 times per day  . pantoprazole  40 mg Oral Q1200    have reviewed scheduled and prn medications.  Physical Exam: General: looks much brighter this AM Heart: irreg Lungs: mostly clear Abdomen: soft, non tender Extremities: no peripheral edema Dialysis Access: left upper arm AVF is patent- minimal bruising    01/12/2015,6:53 AM  LOS: 1 day

## 2015-01-12 NOTE — Progress Notes (Signed)
Subjective: Feels much better today than yesterday. No longer lethargic.   Objective: Vital signs in last 24 hours: Temp:  [97.5 F (36.4 C)-98.1 F (36.7 C)] 98.1 F (36.7 C) (02/17 1015) Pulse Rate:  [26-122] 39 (02/17 1015) Resp:  [11-22] 15 (02/17 1015) BP: (106-156)/(20-59) 156/27 mmHg (02/17 1015) SpO2:  [90 %-100 %] 92 % (02/17 1015) Weight:  [229 lb 11.5 oz (104.2 kg)-234 lb 5.6 oz (106.3 kg)] 231 lb 0.7 oz (104.8 kg) (02/17 1015) Last BM Date: 01/11/15  Intake/Output from previous day: 02/16 0701 - 02/17 0700 In: 223.5 [P.O.:120; IV Piggyback:103.5] Out: 1500 [Urine:500] Intake/Output this shift:    Medications Current Facility-Administered Medications  Medication Dose Route Frequency Provider Last Rate Last Dose  . 0.9 %  sodium chloride infusion  100 mL Intravenous PRN Louis Meckel, MD      . 0.9 %  sodium chloride infusion  100 mL Intravenous PRN Louis Meckel, MD      . 0.9 %  sodium chloride infusion   Intravenous Continuous Deboraha Sprang, MD      . 0.9 %  sodium chloride infusion   Intravenous Continuous Deboraha Sprang, MD      . calcitRIOL (ROCALTROL) capsule 0.25 mcg  0.25 mcg Oral Daily Louis Meckel, MD      . calcium acetate (PHOSLO) capsule 1,334 mg  1,334 mg Oral TID WC Louis Meckel, MD   1,334 mg at 01/12/15 0800  . ceFAZolin (ANCEF) IVPB 2 g/50 mL premix  2 g Intravenous On Call Deboraha Sprang, MD      . Derrill Memo ON 01/18/2015] Darbepoetin Alfa (ARANESP) injection 60 mcg  60 mcg Intravenous Q Tue-HD Louis Meckel, MD   60 mcg at 01/11/15 1548  . feeding supplement (NEPRO CARB STEADY) liquid 237 mL  237 mL Oral PRN Louis Meckel, MD      . gentamicin (GARAMYCIN) 80 mg in sodium chloride irrigation 0.9 % 500 mL irrigation  80 mg Irrigation On Call Deboraha Sprang, MD      . heparin injection 1,000 Units  1,000 Units Dialysis PRN Louis Meckel, MD      . heparin injection 2,100 Units  20 Units/kg  Dialysis PRN Louis Meckel, MD      . heparin injection 2,100 Units  20 Units/kg Dialysis PRN Louis Meckel, MD      . heparin injection 5,000 Units  5,000 Units Subcutaneous 3 times per day Rush Farmer, MD   5,000 Units at 01/12/15 [phone]  . insulin aspart (novoLOG) injection 2-6 Units  2-6 Units Subcutaneous 6 times per day Rush Farmer, MD   2 Units at 01/11/15 1315  . lidocaine (PF) (XYLOCAINE) 1 % injection 5 mL  5 mL Intradermal PRN Louis Meckel, MD      . lidocaine-prilocaine (EMLA) cream 1 application  1 application Topical PRN Louis Meckel, MD      . pantoprazole (PROTONIX) EC tablet 40 mg  40 mg Oral Q1200 Rush Farmer, MD   40 mg at 01/11/15 1309  . pentafluoroprop-tetrafluoroeth (GEBAUERS) aerosol 1 application  1 application Topical PRN Louis Meckel, MD        PE: General appearance: alert, cooperative and no distress Neck: no JVD Lungs: clear to auscultation bilaterally carotids brisk  Heart: bradycardiac, irregular rhythm, 2/6 SM at LUSB split s2 Extremities: no LEE Pulses: 2+ and symmetric Skin: warm and dry Neurologic: Grossly normal  Lab Results:   Recent Labs  01/11/15 1000 01/11/15 1013 01/11/15 1103 01/12/15 0306  WBC 5.2  --  5.2 5.6  HGB 8.6* 9.5* 8.3* 7.8*  HCT 25.7* 28.0* 24.8* 23.9*  PLT 234  --  233 191   BMET  Recent Labs  01/11/15 1600 01/11/15 1922 01/12/15 0306  NA 135 139 139  K 2.0* 4.3 4.6  CL 96 105 107  CO2 29 25 22   GLUCOSE 89 100* 89  BUN <5* 109* 111*  CREATININE 0.84 8.39* 8.41*  CALCIUM 7.7* 8.2* 8.1*   PT/INR  Recent Labs  01/11/15 1103  LABPROT 16.7*  INR 1.33   Assessment/Plan  Active Problems:   Aortic valve stenosis, moderate   Mobitz type 1 second degree AV block   ESRD (end stage renal disease) on dialysis   Dyspnea on exertion   1. Heart Block: current rate is in the mid to upper 40s. BP stable. Pt notes significant improvement in symptoms. Electrolytes  have improved with HD. He is currently undergoing another round of HD.  Have reviewed AS with Dr Burt Knack who notes that while echo data were consistent with severe AS the LVOT/Ao ratio was 0.37 and the bradycardia present at echo but resolved at cath are thus more likely to be consistent with moderate AS   Hence, there is no immediate need for AVR and thus the indication for pacing with MBZ1 heart block will be symptoms.  Given that, and that he feels much better following HD, we will get him up and walk him and see how he feels.  If he remains symptomatic and/or chronotropically incompetent then pacing for symptomatic irreversible bradycardia would be appropriate  Have reveiwed this extensively  11:04-11:45   LOS: 1 day    Brittainy M. Rosita Fire, PA-C 01/12/2015 11:34 AM

## 2015-01-12 NOTE — Progress Notes (Signed)
INITIAL NUTRITION ASSESSMENT  Pt meets criteria for SEVERE MALNUTRITION in the context of acute illness as evidenced by 5% weight loss x 2 weeks, intake of </= 50% of his needs for >/= 5 days and moderate depletion of muscle.  DOCUMENTATION CODES Per approved criteria  -Severe malnutrition in the context of acute illness or injury   INTERVENTION: Nepro Shake po BID, each supplement provides 425 kcal and 19 grams protein  Reviewed basics of renal diet with pt and family. Will follow up for formal education.   NUTRITION DIAGNOSIS: Increased nutrient needs related to hemodialysis as evidenced by estimated needs.   Goal: Pt to meet >/= 90% of their estimated nutrition needs   Monitor:  PO intake, supplement acceptance, weight trends, labs  Reason for Assessment: Pt identified as at nutrition risk on the Malnutrition Screen Tool  74 y.o. male  ASSESSMENT: Pt with stage 4 kidney dz presented to ED with weakness, found to be in complete heart block. Pt in renal failure and started HD 2/16 and will continue HD in [place] at d/c.   Pt lives with daughter who is pt's caregiver. Daughter has responsibility for shopping and cooking. Pt has had a poor appetite x 2 weeks and basically did not eat for 2 weeks. Per their report pt was not eating at all during this time. Pt reports weight loss, 5% in 2 weeks. They do not feel that this was related to fluid loss.  Phosphorus elevated  Nutrition Focused Physical Exam:  Subcutaneous Fat:  Orbital Region: WDL Upper Arm Region: WDL Thoracic and Lumbar Region: WDL  Muscle:  Temple Region: mild/moderate depletion Clavicle Bone Region: WDL Clavicle and Acromion Bone Region: WDL Scapular Bone Region: WDL Dorsal Hand: severe depletion Patellar Region: WDL Anterior Thigh Region: WDL Posterior Calf Region: mild/moderate depletion  Edema: not present   Height: Ht Readings from Last 1 Encounters:  01/11/15 5\' 11"  (1.803 m)    Weight: Wt  Readings from Last 1 Encounters:  01/12/15 231 lb 0.7 oz (104.8 kg)    Ideal Body Weight: 78.1 kg   % Ideal Body Weight: 134%  Wt Readings from Last 10 Encounters:  01/12/15 231 lb 0.7 oz (104.8 kg)  12/03/14 237 lb (107.502 kg)  10/19/14 242 lb (109.77 kg)  10/08/14 242 lb (109.77 kg)  10/02/14 247 lb 4.8 oz (112.175 kg)    Usual Body Weight: 242 lb   % Usual Body Weight: 95%  BMI:  Body mass index is 32.24 kg/(m^2).  Estimated Nutritional Needs: Kcal: [id number] Protein: 115-130 grams Fluid: 1.2 L  Skin: abrasion  Diet Order: Diet NPO time specified  EDUCATION NEEDS: -Education not appropriate at this time.    Intake/Output Summary (Last 24 hours) at 01/12/15 1029 Last data filed at 01/12/15 1015  Gross per 24 hour  Intake  223.5 ml  Output   1500 ml  Net -1276.5 ml    Last BM: 2/16   Labs:   Recent Labs Lab 01/11/15 1000  01/11/15 1600 01/11/15 1922 01/12/15 0306  NA 137  < > 135 139 139  K 5.1  < > 2.0* 4.3 4.6  CL 104  < > 96 105 107  CO2 14*  < > 29 25 22   BUN 154*  < > <5* 109* 111*  CREATININE 10.88*  < > 0.84 8.39* 8.41*  CALCIUM 8.3*  < > 7.7* 8.2* 8.1*  MG 2.9*  --   --   --  2.5  PHOS  --   --   --   --  8.4*  GLUCOSE 98  < > 89 100* 89  < > = values in this interval not displayed.  CBG (last 3)   Recent Labs  01/11/15 2302 01/12/15 0306 01/12/15 0755  GLUCAP 93 85 94    Scheduled Meds: . calcitRIOL  0.25 mcg Oral Daily  . calcium acetate  1,334 mg Oral TID WC  . [START ON 01/18/2015] darbepoetin (ARANESP) injection - DIALYSIS  60 mcg Intravenous Q Tue-HD  . heparin subcutaneous  5,000 Units Subcutaneous 3 times per day  . insulin aspart  2-6 Units Subcutaneous 6 times per day  . pantoprazole  40 mg Oral Q1200    Continuous Infusions:   Past Medical History  Diagnosis Date  . Diabetes mellitus   . Hyperlipidemia   . Hypertension   . Chronic back pain   . End stage renal disease     a. on dialysis, LUE fistula  .  Aortic stenosis     a. severe by echo 09/2014  . Anemia   . Atrial fibrillation     a. not well documented, not on anticoagulation  . Coronary artery disease     a. chronically occluded RCA per cath 09/2014 with collaterals  . Peripheral vascular disease   . Arthritis   . Claustrophobia   . Complete heart block     a. 11/205 resolved off AVN blocking agents b. recurred 12/2014  . CVA (cerebral infarction)     a.  no deficit     Past Surgical History  Procedure Laterality Date  . Cholecystectomy    . Esophagogastroduodenoscopy  08/01/2012    Procedure: ESOPHAGOGASTRODUODENOSCOPY (EGD);  Surgeon: [name], MD;  Location: [facility];  Service: Endoscopy;  Laterality: N/A;  . Tonsillectomy    . Colonoscopy w/ biopsies and polypectomy    . Av fistula placement Left 10/19/2014    Procedure: BRACHIOCEPHALIC ARTERIOVENOUS (AV) FISTULA CREATION ;  Surgeon: Conrad Fort Shaw, MD;  Location: [facility];  Service: Vascular;  Laterality: Left;  . Left and right heart catheterization with coronary angiogram N/A 09/30/2014    Procedure: LEFT AND RIGHT HEART CATHETERIZATION WITH CORONARY ANGIOGRAM;  Surgeon: [name], MD;  Location: [facility];  Service: Cardiovascular;  Laterality: N/A;    Maylon Peppers RD, [place], [place] Pager [phone] After Hours Pager

## 2015-01-13 ENCOUNTER — Encounter (HOSPITAL_COMMUNITY)
Admission: EM | Disposition: A | Discharge status: Home / Self Care | Payer: Self-pay | Source: Home / Self Care | Attending: Pulmonary Disease

## 2015-01-13 DIAGNOSIS — R0902 Hypoxemia: Secondary | ICD-10-CM | POA: Diagnosis present

## 2015-01-13 DIAGNOSIS — R0609 Other forms of dyspnea: Secondary | ICD-10-CM

## 2015-01-13 LAB — BASIC METABOLIC PANEL
Anion gap: 11 (ref 5–15)
BUN: 70 mg/dL — ABNORMAL HIGH (ref 6–23)
CHLORIDE: 103 mmol/L (ref 96–112)
CO2: 26 mmol/L (ref 19–32)
CREATININE: 6.32 mg/dL — AB (ref 0.50–1.35)
Calcium: 8.3 mg/dL — ABNORMAL LOW (ref 8.4–10.5)
GFR calc Af Amer: 9 mL/min — ABNORMAL LOW (ref >90)
GFR calc non Af Amer: 8 mL/min — ABNORMAL LOW (ref >90)
Glucose, Bld: 106 mg/dL — ABNORMAL HIGH (ref 70–99)
Potassium: 4.2 mmol/L (ref 3.5–5.1)
Sodium: 140 mmol/L (ref 135–145)

## 2015-01-13 LAB — GLUCOSE, CAPILLARY
GLUCOSE-CAPILLARY: 103 mg/dL — AB (ref 70–99)
Glucose-Capillary: 144 mg/dL — ABNORMAL HIGH (ref 70–99)
Glucose-Capillary: 116 mg/dL — ABNORMAL HIGH (ref 70–99)
Glucose-Capillary: 144 mg/dL — ABNORMAL HIGH (ref 70–99)

## 2015-01-13 LAB — HEPATITIS B CORE ANTIBODY, TOTAL: Hep B Core Total Ab: NEGATIVE

## 2015-01-13 LAB — MAGNESIUM: Magnesium: 2.4 mg/dL (ref 1.5–2.5)

## 2015-01-13 LAB — CBC
HEMATOCRIT: 24.6 % — AB (ref 39.0–52.0)
HEMOGLOBIN: 7.9 g/dL — AB (ref 13.0–17.0)
MCH: 28.7 pg (ref 26.0–34.0)
MCHC: 32.1 g/dL (ref 30.0–36.0)
MCV: 89.5 fL (ref 78.0–100.0)
Platelets: 190 10*3/uL (ref 150–400)
RBC: 2.75 MIL/uL — ABNORMAL LOW (ref 4.22–5.81)
RDW: 14.6 % (ref 11.5–15.5)
WBC: 6.4 10*3/uL (ref 4.0–10.5)

## 2015-01-13 LAB — IRON AND TIBC
IRON: 59 ug/dL (ref 42–165)
Saturation Ratios: 26 % (ref 20–55)
TIBC: 227 ug/dL (ref 215–435)
UIBC: 168 ug/dL (ref 125–400)

## 2015-01-13 LAB — HEPATITIS B SURFACE ANTIBODY, QUANTITATIVE: Hepatitis B-Post: <3.1 m[IU]/mL — ABNORMAL LOW (ref >9.9)

## 2015-01-13 LAB — PHOSPHORUS: Phosphorus: 5.8 mg/dL — ABNORMAL HIGH (ref 2.3–4.6)

## 2015-01-13 LAB — ABO/RH: ABO/RH(D): O NEG

## 2015-01-13 LAB — PREPARE RBC (CROSSMATCH)

## 2015-01-13 SURGERY — PERMANENT PACEMAKER INSERTION

## 2015-01-13 MED ORDER — SODIUM CHLORIDE 0.9 % IV SOLN: Freq: Once | INTRAVENOUS | Status: DC

## 2015-01-13 MED ORDER — AMLODIPINE BESYLATE 5 MG PO TABS
5.0000 mg | ORAL_TABLET | Freq: Every day | ORAL | Status: DC
  Administered 2015-01-13: 5 mg via ORAL
  Filled 2015-01-13 (×2): qty 1

## 2015-01-13 NOTE — Plan of Care (Cosign Needed)
Problem: Food- and Nutrition-Related Knowledge Deficit (NB-1.1) Goal: Nutrition education Formal process to instruct or train a patient/client in a skill or to impart knowledge to help patients/clients voluntarily manage or modify food choices and eating behavior to maintain or improve health. Outcome: Completed/Met Date Met:  01/13/15 Pt initially identified due to Malnutrition Screening Tool (MST) report; required additional nutrition education for renal diet  Pt with hx of DM2, and familiar with CHO counting. New HD pt for ESRD.  Wife at bedside. Discussed outpatient education, pt receptive.   DI provided "Chose A Meal: A Personal Meal Plan for Dialysis Patients" from the [employer]. Discussed different food groups and emphasized the importance of protein. Explained how to reduce potassium, phosphorous and sodium intake in the diet, and provided alternative food options.  Provided a menu with sample foods for breakfast, lunch and dinner, and discussed fluid restriction. Also included helpful tips for maintaining CHO counting while on renal diet. Teach back method used.  Expect good compliance.  Body mass index is 32.24 kg/(m^2). Pt meets criteria for obesity class I based on current BMI.  Current diet order is Renal/CHO Mod with 1.2L fluid restriction. Labs and medications reviewed. No further nutrition interventions warranted at this time. If additional nutrition issues arise, please consult RD.  Jeffrey Dove, MS Dietetic Intern Pager: [phone]

## 2015-01-13 NOTE — Progress Notes (Signed)
SUBJECTIVE: The patient is doing well today.  At this time, he denies chest pain, shortness of breath, or any new concerns.  Remains significantly improved symptomatically.  Remains in Mobitz I, heart rate 30-40's at rest, up to 60's with exertion.   Plan for HD and transfusion tomorrow.  CURRENT MEDICATIONS: . sodium chloride   Intravenous Once  . amLODipine  5 mg Oral QHS  . calcitRIOL  0.25 mcg Oral Daily  . calcium acetate  1,334 mg Oral TID WC  .  ceFAZolin (ANCEF) IV  2 g Intravenous On Call  . [START ON 01/18/2015] darbepoetin (ARANESP) injection - DIALYSIS  60 mcg Intravenous Q Tue-HD  . feeding supplement (NEPRO CARB STEADY)  237 mL Oral BID BM  . gentamicin irrigation  80 mg Irrigation On Call  . heparin subcutaneous  5,000 Units Subcutaneous 3 times per day  . insulin aspart  0-15 Units Subcutaneous TID WC  . insulin aspart  0-5 Units Subcutaneous QHS  . pantoprazole  40 mg Oral Q1200   . sodium chloride 50 mL/hr at 01/13/15 0606    OBJECTIVE: Physical Exam: Filed Vitals:   01/13/15 0600 01/13/15 0700 01/13/15 0800 01/13/15 0900  BP: 166/46  155/46 149/40  Pulse: 36 38 36 39  Temp:   97.7 F (36.5 C)   TempSrc:   Oral   Resp: 15 9 13 12   Height:      Weight:      SpO2: 99% 87% 93% 94%    Intake/Output Summary (Last 24 hours) at 01/13/15 0940 Last data filed at 01/13/15 0800  Gross per 24 hour  Intake    335 ml  Output    500 ml  Net   -165 ml    Telemetry reveals Mobitz1 with ventricular rates 30-40's, HR up to 70's w exertion.   Physical Exam: Well developed and nourished in no acute distress HENT normal Neck supple with JVP-flat Clear Regular rate and rhythm,2/6 with preserved S2 Abd-soft with active BS No Clubbing cyanosis edema Skin-warm and dry A & Oriented  Grossly normal sensory and motor function   LABS: Basic Metabolic Panel:  Recent Labs  01/12/15 0306 01/13/15 0331  NA 139 140  K 4.6 4.2  CL 107 103  CO2 22 26  GLUCOSE  89 106*  BUN 111* 70*  CREATININE 8.41* 6.32*  CALCIUM 8.1* 8.3*  MG 2.5 2.4  PHOS 8.4* 5.8*   Liver Function Tests:  Recent Labs  01/11/15 1000 01/11/15 1103  AST 9 9  ALT 8 8  ALKPHOS 77 82  BILITOT 0.8 0.8  PROT 6.1 6.0  ALBUMIN 3.3* 3.4*   CBC:  Recent Labs  01/11/15 1103 01/12/15 0306 01/13/15 0331  WBC 5.2 5.6 6.4  NEUTROABS 3.6  --   --   HGB 8.3* 7.8* 7.9*  HCT 24.8* 23.9* 24.6*  MCV 87.0 88.5 89.5  PLT 233 191 190    RADIOLOGY: Dg Chest Portable 1 View 01/11/2015   CLINICAL DATA:  74 year old male with shortness of breath and chest pain. Vomiting. Initial encounter.  EXAM: PORTABLE CHEST - 1 VIEW  COMPARISON:  09/28/2014 and earlier.  FINDINGS: Portable AP upright view at 1027 hours. Increased interstitial opacity and indistinctness of pulmonary vasculature. Lower lung volumes. Stable cardiomegaly and mediastinal contours. no pneumothorax. Mild veiling bibasilar opacity. No consolidation identified. Cardiac monitor or resuscitation pads project over the chest.  IMPRESSION: Abnormal pulmonary opacity most suggestive of acute pulmonary edema, likely with small pleural effusions.  Electronically Signed   By: Genevie Ann M.D.   On: 01/11/2015 10:40    ASSESSMENT AND PLAN:  Active Problems:   Aortic valve stenosis, moderate   Mobitz type 1 second degree AV block   ESRD (end stage renal disease) on dialysis   Dyspnea on exertion   Largely asymptomatic relative to bradycardia  Will follow abnd hold on pacing for now Will followup in a few weeks Have reviewed with family

## 2015-01-13 NOTE — Progress Notes (Signed)
PULMONARY / CRITICAL CARE MEDICINE   Name: Jeffrey [name] MRN: [id number] DOB: Dec 15, 1940    ADMISSION DATE:  01/11/2015 CONSULTATION DATE:  01/11/2015  REFERRING MD :  EDP  CHIEF COMPLAINT:  Complete heart block  INITIAL PRESENTATION: 74 year old male with history of stage 4 kidney disease presenting to the ED with the chief complaint of weakness.  Patient was attached to monitor where he was found to be in complete heart block.  Cardiology, renal and CC were consulted.  Patient was noted in renal failure with K of 5, HCO3 of 15, Cr 10.8 and BUN of 139.  STUDIES:  2/16 renal failure and complete heart block  SIGNIFICANT EVENTS: 2/16 first HD. 2/17 feels much better but remains in heart block.  SUBJECTIVE: Feels much better this AM, HR increases to 70 with ambulation.  VITAL SIGNS: Temp:  [97.4 F (36.3 C)-98.4 F (36.9 C)] 97.7 F (36.5 C) (02/18 0800) Pulse Rate:  [32-147] 39 (02/18 0900) Resp:  [9-25] 12 (02/18 0900) BP: (115-166)/(10-93) 149/40 mmHg (02/18 0900) SpO2:  [87 %-100 %] 94 % (02/18 0900) Weight:  [104.6 kg (230 lb 9.6 oz)] 104.6 kg (230 lb 9.6 oz) (02/18 0410) HEMODYNAMICS:   VENTILATOR SETTINGS:  2L [place] INTAKE / OUTPUT:  Intake/Output Summary (Last 24 hours) at 01/13/15 1032 Last data filed at 01/13/15 0800  Gross per 24 hour  Intake    335 ml  Output    500 ml  Net   -165 ml   PHYSICAL EXAMINATION: General:  Chronically ill appearing male, NAD Neuro:  Alert and oriented, moving all ext to command. HEENT:  [place]/AT, PERRL, EOM-I and MMM. Cardiovascular:  IRIR, brady, Nl S1/S2, -M/R/G. Lungs: Bibasilar crackles Abdomen:  Soft, NT, ND and +BS. Musculoskeletal:  +2 edema bilaterally. Skin:  Intact.  LABS:  CBC  Recent Labs Lab 01/11/15 1103 01/12/15 0306 01/13/15 0331  WBC 5.2 5.6 6.4  HGB 8.3* 7.8* 7.9*  HCT 24.8* 23.9* 24.6*  PLT 233 191 190   Coag's  Recent Labs Lab 01/11/15 1103  APTT 41*  INR 1.33   BMET  Recent Labs Lab  01/11/15 1922 01/12/15 0306 01/13/15 0331  NA 139 139 140  K 4.3 4.6 4.2  CL 105 107 103  CO2 25 22 26   BUN 109* 111* 70*  CREATININE 8.39* 8.41* 6.32*  GLUCOSE 100* 89 106*   Electrolytes  Recent Labs Lab 01/11/15 1000  01/11/15 1922 01/12/15 0306 01/13/15 0331  CALCIUM 8.3*  < > 8.2* 8.1* 8.3*  MG 2.9*  --   --  2.5 2.4  PHOS  --   --   --  8.4* 5.8*  < > = values in this interval not displayed. Sepsis Markers  Recent Labs Lab 01/11/15 1033 01/11/15 1325 01/11/15 1332  LATICACIDVEN 0.86 0.8 0.60   ABG No results for input(s): PHART, PCO2ART, PO2ART in the last 168 hours. Liver Enzymes  Recent Labs Lab 01/11/15 1000 01/11/15 1103  AST 9 9  ALT 8 8  ALKPHOS 77 82  BILITOT 0.8 0.8  ALBUMIN 3.3* 3.4*   Cardiac Enzymes No results for input(s): TROPONINI, PROBNP in the last 168 hours. Glucose  Recent Labs Lab 01/12/15 0306 01/12/15 0755 01/12/15 1213 01/12/15 1608 01/12/15 2127 01/13/15 0741  GLUCAP 85 94 108* 169* 163* 103*    Imaging No results found. ASSESSMENT / PLAN:  PULMONARY OETT None A: Hypoxemia due to pulmonary edema. P:   - Titrate O2 for sats. - IS per  RT protocol. - HD per renal.  CARDIOVASCULAR CVL None Left AV graft. A: Complete heart block, likely related to metabolic disarray. P:  - HD per renal. - Tele monitoring. - Spoke with EP, no pacer necessary as HR increases with ambulation.  RENAL A:  ESRD at this point.  Hyperkalemia and acidosis. P:   - Renal consult appreciated. - HD again today. - Replace electrolytes as indicated.  GASTROINTESTINAL A:  No active issues. P:   - Renal diabetic diet. - BMET in AM.  HEMATOLOGIC A:  No active issues. P:  - Monitor.  INFECTIOUS A:  No active issues. P:   - Monitor off abx.  ENDOCRINE A:  DM.   P:   - ISS.  NEUROLOGIC A:  Tired, likely related to low HR and marginal BP. P:   - Hold all anti-HTN and sedating medications. - Monitor post  dialysis.  Cardiology has decided to pacer is needed, will transfer to SDU and to [facility] service.  PCCM will sign off, please call back if needed.  Rush Farmer, M.D. [facility] Pulmonary/Critical Care Medicine. Pager: [phone]. After hours pager: [phone].  01/13/2015, 10:32 AM

## 2015-01-13 NOTE — Progress Notes (Signed)
Subjective:  Continues to feel much better than upon admission.  S/p HD #2 yesterday.  Patient thinks he may be getting pacer this AM but Dr. Caryl Comes in his note indicates it might not be needed if no symptoms  Objective Vital signs in last 24 hours: Filed Vitals:   01/13/15 0400 01/13/15 0410 01/13/15 0500 01/13/15 0600  BP: 147/38  149/31 166/46  Pulse: 41  36 36  Temp:  98.1 F (36.7 C)    TempSrc:  Oral    Resp: 12  11 15   Height:      Weight:  104.6 kg (230 lb 9.6 oz)    SpO2: 95%  98% 99%   Weight change: -1.4 kg (-3 lb 1.4 oz)  Intake/Output Summary (Last 24 hours) at 01/13/15 [id number] Last data filed at 01/13/15 0414  Gross per 24 hour  Intake    240 ml  Output    500 ml  Net   -260 ml    Assessment/Plan: 74 year old white male with multiple medical issues including advanced CKD now with uremic symptoms 1.Renal- late stage 4 CKD/ now ESRD- patient with uremic symptoms now time to initiate dialysis therapy. Patient understands this. Had first HD 2/16, second 2/17.  Patientmaybe needs to get pacing wire placed vs permanent pacer this hospitalization. I will give him a break today and plan for HD tomorrow.  Need to clarify OP spot because patient now says he wants to go [facility]- very close to his house 2. HTN/Vol- heart block - patient now with heart block blood pressure better. He was on a number of antihypertensives in the outpatient setting including labetalol. They were on hold, will resume norvasc q HS.  Possible pacer this admit, today ???  3. Anemia - he has received iron as an outpatient. Hemoglobin is lower than what it has been. Have initiated ESA and check iron stores to see if he needs more iron- still pending.  Will plan on giving blood with HD tomorrow given hgb in the 7's 4. Secondary hyperparathyroidism was on calcitriol as an outpatient. Last PTH was actually good at 87. Will continue calcitriol here.  Needs a binder as well, will add phoslo 5. Disposition will be  based on how well he bounces back from this acute illness. Hopefully he would be able to return to his home living situation.  Seems to be doing well.  Will clarify OP dialysis spot today   Aileene Lanum A    Labs: Basic Metabolic Panel:  Recent Labs Lab 01/11/15 1922 01/12/15 0306 01/13/15 0331  NA 139 139 140  K 4.3 4.6 4.2  CL 105 107 103  CO2 25 22 26   GLUCOSE 100* 89 106*  BUN 109* 111* 70*  CREATININE 8.39* 8.41* 6.32*  CALCIUM 8.2* 8.1* 8.3*  PHOS  --  8.4* 5.8*   Liver Function Tests:  Recent Labs Lab 01/11/15 1000 01/11/15 1103  AST 9 9  ALT 8 8  ALKPHOS 77 82  BILITOT 0.8 0.8  PROT 6.1 6.0  ALBUMIN 3.3* 3.4*   No results for input(s): LIPASE, AMYLASE in the last 168 hours. No results for input(s): AMMONIA in the last 168 hours. CBC:  Recent Labs Lab 01/11/15 1000  01/11/15 1103 01/12/15 0306 01/13/15 0331  WBC 5.2  --  5.2 5.6 6.4  NEUTROABS  --   --  3.6  --   --   HGB 8.6*  < > 8.3* 7.8* 7.9*  HCT 25.7*  < > 24.8* 23.9*  24.6*  MCV 87.4  --  87.0 88.5 89.5  PLT 234  --  233 191 190  < > = values in this interval not displayed. Cardiac Enzymes: No results for input(s): CKTOTAL, CKMB, CKMBINDEX, TROPONINI in the last 168 hours. CBG:  Recent Labs Lab 01/12/15 0306 01/12/15 0755 01/12/15 1213 01/12/15 1608 01/12/15 2127  GLUCAP 85 94 108* 169* 163*    Iron Studies:   Recent Labs  01/12/15 0730  FERRITIN 189   Studies/Results: Dg Chest Portable 1 View  01/11/2015   CLINICAL DATA:  74 year old male with shortness of breath and chest pain. Vomiting. Initial encounter.  EXAM: PORTABLE CHEST - 1 VIEW  COMPARISON:  09/28/2014 and earlier.  FINDINGS: Portable AP upright view at 1027 hours. Increased interstitial opacity and indistinctness of pulmonary vasculature. Lower lung volumes. Stable cardiomegaly and mediastinal contours. no pneumothorax. Mild veiling bibasilar opacity. No consolidation identified. Cardiac monitor or  resuscitation pads project over the chest.  IMPRESSION: Abnormal pulmonary opacity most suggestive of acute pulmonary edema, likely with small pleural effusions.   Electronically Signed   By: Genevie Ann M.D.   On: 01/11/2015 10:40   Medications: Infusions: . sodium chloride 50 mL/hr at 01/13/15 0606    Scheduled Medications: . calcitRIOL  0.25 mcg Oral Daily  . calcium acetate  1,334 mg Oral TID WC  .  ceFAZolin (ANCEF) IV  2 g Intravenous On Call  . [START ON 01/18/2015] darbepoetin (ARANESP) injection - DIALYSIS  60 mcg Intravenous Q Tue-HD  . feeding supplement (NEPRO CARB STEADY)  237 mL Oral BID BM  . gentamicin irrigation  80 mg Irrigation On Call  . heparin subcutaneous  5,000 Units Subcutaneous 3 times per day  . insulin aspart  0-15 Units Subcutaneous TID WC  . insulin aspart  0-5 Units Subcutaneous QHS  . pantoprazole  40 mg Oral Q1200    have reviewed scheduled and prn medications.  Physical Exam: General: looks  brighter this AM Heart: irreg Lungs: mostly clear Abdomen: soft, non tender Extremities: no peripheral edema Dialysis Access: left upper arm AVF is patent- minimal bruising    01/13/2015,7:12 AM  LOS: 2 days

## 2015-01-14 DIAGNOSIS — E43 Unspecified severe protein-calorie malnutrition: Secondary | ICD-10-CM | POA: Diagnosis present

## 2015-01-14 LAB — GLUCOSE, CAPILLARY
GLUCOSE-CAPILLARY: 96 mg/dL (ref 70–99)
GLUCOSE-CAPILLARY: 142 mg/dL — AB (ref 70–99)
GLUCOSE-CAPILLARY: 155 mg/dL — AB (ref 70–99)
GLUCOSE-CAPILLARY: 99 mg/dL (ref 70–99)

## 2015-01-14 LAB — BASIC METABOLIC PANEL
Anion gap: 9 (ref 5–15)
BUN: 66 mg/dL — ABNORMAL HIGH (ref 6–23)
CO2: 27 mmol/L (ref 19–32)
CREATININE: 6.56 mg/dL — AB (ref 0.50–1.35)
Calcium: 8.3 mg/dL — ABNORMAL LOW (ref 8.4–10.5)
Chloride: 103 mmol/L (ref 96–112)
GFR calc Af Amer: 9 mL/min — ABNORMAL LOW (ref >90)
GFR calc non Af Amer: 7 mL/min — ABNORMAL LOW (ref >90)
Glucose, Bld: 101 mg/dL — ABNORMAL HIGH (ref 70–99)
Potassium: 3.9 mmol/L (ref 3.5–5.1)
Sodium: 139 mmol/L (ref 135–145)

## 2015-01-14 LAB — CBC
HCT: 23.9 % — ABNORMAL LOW (ref 39.0–52.0)
Hemoglobin: 7.7 g/dL — ABNORMAL LOW (ref 13.0–17.0)
MCH: 29.4 pg (ref 26.0–34.0)
MCHC: 32.2 g/dL (ref 30.0–36.0)
MCV: 91.2 fL (ref 78.0–100.0)
Platelets: 160 10*3/uL (ref 150–400)
RBC: 2.62 MIL/uL — AB (ref 4.22–5.81)
RDW: 14.7 % (ref 11.5–15.5)
WBC: 6.4 10*3/uL (ref 4.0–10.5)

## 2015-01-14 LAB — MAGNESIUM: Magnesium: 2.3 mg/dL (ref 1.5–2.5)

## 2015-01-14 LAB — PHOSPHORUS: Phosphorus: 5.5 mg/dL — ABNORMAL HIGH (ref 2.3–4.6)

## 2015-01-14 MED ORDER — HYDRALAZINE HCL 50 MG PO TABS
50.0000 mg | ORAL_TABLET | Freq: Three times a day (TID) | ORAL | Status: DC
  Administered 2015-01-14 – 2015-01-15 (×3): 50 mg via ORAL
  Filled 2015-01-14 (×5): qty 1

## 2015-01-14 MED ORDER — HYDRALAZINE HCL 20 MG/ML IJ SOLN: 10.0000 mg | INTRAMUSCULAR | Status: DC | PRN

## 2015-01-14 MED ORDER — AMLODIPINE BESYLATE 10 MG PO TABS
10.0000 mg | ORAL_TABLET | Freq: Every day | ORAL | Status: DC
  Administered 2015-01-14 – 2015-01-15 (×2): 10 mg via ORAL
  Filled 2015-01-14 (×2): qty 1

## 2015-01-14 NOTE — Progress Notes (Signed)
Subjective:  Continues to feel much better than upon admission.  Now on for his third HD.  Now seems that pacer has been decided against. He is more mobile.  Has been set up as OP at [facility]   Objective Vital signs in last 24 hours: Filed Vitals:   01/14/15 0748 01/14/15 0753 01/14/15 0830 01/14/15 0845  BP: 145/48 148/67 132/62 121/49  Pulse: 44 48 47 46  Temp: 97.8 F (36.6 C)   97.8 F (36.6 C)  TempSrc: Oral   Oral  Resp: 14 15 13 13   Height:      Weight: 105 kg (231 lb 7.7 oz)     SpO2: 93%   95%   Weight change: 0.244 kg (8.6 oz)  Intake/Output Summary (Last 24 hours) at 01/14/15 [id number] Last data filed at 01/14/15 0845  Gross per 24 hour  Intake   1210 ml  Output    650 ml  Net    560 ml    Assessment/Plan: 74 year old white male with multiple medical issues including advanced CKD now with uremic symptoms 1.Renal- late stage 4 CKD/ now ESRD- patient with uremic symptoms now time to initiate dialysis therapy. Patient understands this. Had first HD 2/16, second 2/17 and third today.  Now seems he wont need a pacer. Has a spot at [facility] on TTS- so will plan for fourth HD tomorrow but then could be discharged tomorrow from my standpoint to start there on Tuesday. 2. HTN/Vol- heart block - patient now with heart block blood pressure better. He was on a number of antihypertensives in the outpatient setting including labetalol. I have resumed norvasc q HS.  Volume status seems OK 3. Anemia - he has received iron as an outpatient. Hemoglobin is lower than what it has been. Have initiated ESA and check iron stores to see if he needs more iron- sat 26 and ferr 189.   giving blood with HD today 4. Secondary hyperparathyroidism was on calcitriol as an outpatient. Last PTH was actually good at 87. Will continue calcitriol here.  Needs a binder as well, have added phoslo 5. Disposition seems to be approaching discharge- could aim for tomorrow after HD- then to go to OP center on  Tuesday  Jourden Delmont A    Labs: Basic Metabolic Panel:  Recent Labs Lab 01/12/15 0306 01/13/15 0331 01/14/15 0333  NA 139 140 139  K 4.6 4.2 3.9  CL 107 103 103  CO2 22 26 27   GLUCOSE 89 106* 101*  BUN 111* 70* 66*  CREATININE 8.41* 6.32* 6.56*  CALCIUM 8.1* 8.3* 8.3*  PHOS 8.4* 5.8* 5.5*   Liver Function Tests:  Recent Labs Lab 01/11/15 1000 01/11/15 1103  AST 9 9  ALT 8 8  ALKPHOS 77 82  BILITOT 0.8 0.8  PROT 6.1 6.0  ALBUMIN 3.3* 3.4*   No results for input(s): LIPASE, AMYLASE in the last 168 hours. No results for input(s): AMMONIA in the last 168 hours. CBC:  Recent Labs Lab 01/11/15 1000  01/11/15 1103 01/12/15 0306 01/13/15 0331 01/14/15 0333  WBC 5.2  --  5.2 5.6 6.4 6.4  NEUTROABS  --   --  3.6  --   --   --   HGB 8.6*  < > 8.3* 7.8* 7.9* 7.7*  HCT 25.7*  < > 24.8* 23.9* 24.6* 23.9*  MCV 87.4  --  87.0 88.5 89.5 91.2  PLT 234  --  233 191 190 160  < > = values in this interval  not displayed. Cardiac Enzymes: No results for input(s): CKTOTAL, CKMB, CKMBINDEX, TROPONINI in the last 168 hours. CBG:  Recent Labs Lab 01/13/15 0741 01/13/15 1202 01/13/15 1707 01/13/15 2140 01/14/15 0721  GLUCAP 103* 144* 116* 144* 96    Iron Studies:   Recent Labs  01/12/15 0730  IRON 59  TIBC 227  FERRITIN 189   Studies/Results: No results found. Medications: Infusions:    Scheduled Medications: . amLODipine  5 mg Oral QHS  . calcitRIOL  0.25 mcg Oral Daily  . calcium acetate  1,334 mg Oral TID WC  . [START ON 01/18/2015] darbepoetin (ARANESP) injection - DIALYSIS  60 mcg Intravenous Q Tue-HD  . feeding supplement (NEPRO CARB STEADY)  237 mL Oral BID BM  . heparin subcutaneous  5,000 Units Subcutaneous 3 times per day  . insulin aspart  0-15 Units Subcutaneous TID WC  . insulin aspart  0-5 Units Subcutaneous QHS  . pantoprazole  40 mg Oral Q1200    have reviewed scheduled and prn medications.  Physical Exam: General: looks   brighter this AM Heart: irreg Lungs: mostly clear Abdomen: soft, non tender Extremities: no peripheral edema Dialysis Access: left upper arm AVF is patent- minimal bruising    01/14/2015,9:03 AM  LOS: 3 days

## 2015-01-14 NOTE — Progress Notes (Signed)
SUBJECTIVE: The patient is doing well today.  At this time, he denies chest pain, shortness of breath, or any new concerns.  Remains significantly improved symptomatically.  He states "I feel like a new man".  Remains in Mobitz I, heart rate 30-40's at rest, up to 60's with exertion.      CURRENT MEDICATIONS: . sodium chloride   Intravenous Once  . amLODipine  5 mg Oral QHS  . calcitRIOL  0.25 mcg Oral Daily  . calcium acetate  1,334 mg Oral TID WC  . [START ON 01/18/2015] darbepoetin (ARANESP) injection - DIALYSIS  60 mcg Intravenous Q Tue-HD  . feeding supplement (NEPRO CARB STEADY)  237 mL Oral BID BM  . heparin subcutaneous  5,000 Units Subcutaneous 3 times per day  . insulin aspart  0-15 Units Subcutaneous TID WC  . insulin aspart  0-5 Units Subcutaneous QHS  . pantoprazole  40 mg Oral Q1200      OBJECTIVE: Physical Exam: Filed Vitals:   01/14/15 0100 01/14/15 0200 01/14/15 0248 01/14/15 0330  BP:      Pulse: 38 41 41 41  Temp:    98.1 F (36.7 C)  TempSrc:    Oral  Resp:  14 14 14   Height:    5\' 11"  (1.803 m)  Weight:    231 lb 12.8 oz (105.144 kg)  SpO2: 96% 94% 88% 90%    Intake/Output Summary (Last 24 hours) at 01/14/15 0719 Last data filed at 01/14/15 0600  Gross per 24 hour  Intake   1110 ml  Output    650 ml  Net    460 ml    Telemetry reveals Mobitz1 with ventricular rates 30-40's, chronically ill, HR up to 70's w exertion.  Physical Exam: Well developed and nourished in no acute distress HENT normal Neck supple with JVP-flat Clear Regular rate and rhythm,2/6 with preserved S2 Abd-soft with active BS No Clubbing cyanosis edema Skin-warm and dry A & Oriented  Grossly normal sensory and motor function   LABS: Basic Metabolic Panel:  Recent Labs  01/13/15 0331 01/14/15 0333  NA 140 139  K 4.2 3.9  CL 103 103  CO2 26 27  GLUCOSE 106* 101*  BUN 70* 66*  CREATININE 6.32* 6.56*  CALCIUM 8.3* 8.3*  MG 2.4 2.3  PHOS 5.8* 5.5*   Liver  Function Tests:  Recent Labs  01/11/15 1000 01/11/15 1103  AST 9 9  ALT 8 8  ALKPHOS 77 82  BILITOT 0.8 0.8  PROT 6.1 6.0  ALBUMIN 3.3* 3.4*   CBC:  Recent Labs  01/11/15 1103  01/13/15 0331 01/14/15 0333  WBC 5.2  < > 6.4 6.4  NEUTROABS 3.6  --   --   --   HGB 8.3*  < > 7.9* 7.7*  HCT 24.8*  < > 24.6* 23.9*  MCV 87.0  < > 89.5 91.2  PLT 233  < > 190 160  < > = values in this interval not displayed.  RADIOLOGY: Dg Chest Portable 1 View 01/11/2015   CLINICAL DATA:  74 year old male with shortness of breath and chest pain. Vomiting. Initial encounter.  EXAM: PORTABLE CHEST - 1 VIEW  COMPARISON:  09/28/2014 and earlier.  FINDINGS: Portable AP upright view at 1027 hours. Increased interstitial opacity and indistinctness of pulmonary vasculature. Lower lung volumes. Stable cardiomegaly and mediastinal contours. no pneumothorax. Mild veiling bibasilar opacity. No consolidation identified. Cardiac monitor or resuscitation pads project over the chest.  IMPRESSION: Abnormal pulmonary opacity  most suggestive of acute pulmonary edema, likely with small pleural effusions.   Electronically Signed   By: Genevie Ann M.D.   On: 01/11/2015 10:40    ASSESSMENT AND PLAN:  Active Problems:   Aortic valve stenosis, moderate   Mobitz type 1 second degree AV block   ESRD (end stage renal disease) on dialysis   Dyspnea on exertion   Hypoxemia  1. Mobitz I second degree AV block Doing well at this time, Appears clinically stable I had a long discussion with patient, spouse, and son today.  They are clear that they would prefer to avoid PPM. Will therefore follow with Dr Caryl Comes in the office.  If he develops worsening symptoms of bradycardia then pacing may be required in the future.    OK to discharge from EP standpoint. Electrophysiology team to see as needed while here. Please call with questions.

## 2015-01-14 NOTE — Progress Notes (Signed)
01/14/2015 9:34 AM Hemodialysis Outpatient Note; this patient has been accepted at the [place] [facility] center on a Tuesday, Thursday and Saturday 2nd shift schedule. The center can begin treatment on Tuesday 2.23.16 at 11:30 AM. Thank you. Gordy Savers

## 2015-01-14 NOTE — Progress Notes (Signed)
PT Cancellation Note  Patient Details Name: Jeffrey [name] MRN: [id number] DOB: 1941-07-30   Cancelled Treatment:    Reason Eval/Treat Not Completed: Patient at procedure or test/unavailable;Other (comment) (Was in HD when attempted this AM and is now fatigued.) Pt has been amb in room with nursing and has equipment and support at home. Will check on tomorrow.   Mazell Aylesworth 01/14/2015, 4:06 PM  [employer] [place]

## 2015-01-14 NOTE — Progress Notes (Signed)
[facility] TEAM 1 - Stepdown/ICU TEAM Progress Note  Jeffrey [name] [id number] DOB: 21-Sep-1941 DOA: 01/11/2015 PCP: Thurman Coyer, MD  Admit HPI / Brief Narrative: 74 year old male with history of DM, HLD, HTN, AoS, Afib, CAD, PVD, prior CVA, and stage 4 chronic kidney disease who presented to the ED with a chief complaint of weakness. Patient was found to be in complete heart block. Cardiology, Renal and PCCM were consulted. Patient was noted in renal failure with K of 5, HCO3 of 15, Cr 10.8 and BUN of 139.  Significant Events: 2/16 first HD 2/17 remains in heart block.  HPI/Subjective: Pt states he feels much better today.  He denies cp, sob, n/v, or abdom pain.  He is tolerating HD w/o difficulty at the time of my exam.    Assessment/Plan:  Acute hypoxic respiratory failure due to pulmonary edema Corrected w/ HD  Late stage 4 CKD declined to new ESRD w/ hyperkalemia and metabolic acidosis  Per Nephrology - HD has been initiated - has been set up for outpt HD - to get one more inpt tx Saturday, and will then plan for d/c home   Mobitz Type 1 second degree AV block Per EP - no plans for pacer at this time - to f/u w/ EP "in a few weeks"  Anemia of CKD For transfusion today in HD - Fe studies now within low normal range - f/u CBC in AM   CAD cardiac catheterization 09/30/14 with "significant coronary obstructive disease with coronary calcification and total occlusion of the proximal to mid RCA with extensive left-to-right collaterals; 50 - 60% stenosis in the proximal LAD after the first diagonal branch, and 30% mid AV groove circumflex stenoses"  DM CBG currently well controlled  HTN BP reasonably controlled - follow w/o change for now w/ ongoing HD   AoS - moderate Per cath 09/30/14 "43 mm gradient in this patient with atrial fibrillation and calculated valve area of 1.72 cm"  Severe malnutrition in the context of acute illness   Code Status: FULL Family  Communication: no family present at time of exam Disposition Plan: transfer to med bed on renal unit - plan for d/c home after HD 2/20  Consultants: Nephrology  [facility] Cards/EP  Antibiotics: none  DVT prophylaxis: SQ heparin   Objective: Blood pressure 148/67, pulse 48, temperature 97.8 F (36.6 C), temperature source Oral, resp. rate 15, height 5\' 11"  (1.803 m), weight 105 kg (231 lb 7.7 oz), SpO2 93 %.  Intake/Output Summary (Last 24 hours) at 01/14/15 0820 Last data filed at 01/14/15 0600  Gross per 24 hour  Intake   1060 ml  Output    650 ml  Net    410 ml   Exam: General: No acute respiratory distress Lungs: Clear to auscultation bilaterally without wheezes or crackles Cardiovascular: Regular rhythm without murmur gallop or rub  Abdomen: Nontender, nondistended, soft, bowel sounds positive, no rebound, no ascites, no appreciable mass Extremities: No significant cyanosis, clubbing, or edema bilateral lower extremities  Data Reviewed: Basic Metabolic Panel:  Recent Labs Lab 01/11/15 1000  01/11/15 1600 01/11/15 1922 01/12/15 0306 01/13/15 0331 01/14/15 0333  NA 137  < > 135 139 139 140 139  K 5.1  < > 2.0* 4.3 4.6 4.2 3.9  CL 104  < > 96 105 107 103 103  CO2 14*  < > 29 25 22 26 27   GLUCOSE 98  < > 89 100* 89 106* 101*  BUN 154*  < > <  5* 109* 111* 70* 66*  CREATININE 10.88*  < > 0.84 8.39* 8.41* 6.32* 6.56*  CALCIUM 8.3*  < > 7.7* 8.2* 8.1* 8.3* 8.3*  MG 2.9*  --   --   --  2.5 2.4 2.3  PHOS  --   --   --   --  8.4* 5.8* 5.5*  < > = values in this interval not displayed.  Liver Function Tests:  Recent Labs Lab 01/11/15 1000 01/11/15 1103  AST 9 9  ALT 8 8  ALKPHOS 77 82  BILITOT 0.8 0.8  PROT 6.1 6.0  ALBUMIN 3.3* 3.4*   Coags:  Recent Labs Lab 01/11/15 1103  INR 1.33    Recent Labs Lab 01/11/15 1103  APTT 41*   CBC:  Recent Labs Lab 01/11/15 1000 01/11/15 1013 01/11/15 1103 01/12/15 0306 01/13/15 0331 01/14/15 0333  WBC 5.2   --  5.2 5.6 6.4 6.4  NEUTROABS  --   --  3.6  --   --   --   HGB 8.6* 9.5* 8.3* 7.8* 7.9* 7.7*  HCT 25.7* 28.0* 24.8* 23.9* 24.6* 23.9*  MCV 87.4  --  87.0 88.5 89.5 91.2  PLT 234  --  233 191 190 160   CBG:  Recent Labs Lab 01/12/15 2127 01/13/15 0741 01/13/15 1202 01/13/15 1707 01/13/15 2140  GLUCAP 163* 103* 144* 116* 144*    Recent Results (from the past 240 hour(s))  MRSA PCR Screening     Status: None   Collection Time: 01/11/15  2:05 PM  Result Value Ref Range Status   MRSA by PCR NEGATIVE NEGATIVE Final    Comment:        The GeneXpert MRSA Assay (FDA approved for NASAL specimens only), is one component of a comprehensive MRSA colonization surveillance program. It is not intended to diagnose MRSA infection nor to guide or monitor treatment for MRSA infections.   Clostridium Difficile by PCR     Status: None   Collection Time: 01/12/15 10:15 AM  Result Value Ref Range Status   C difficile by pcr NEGATIVE NEGATIVE Final     Studies:  Recent x-ray studies have been reviewed in detail by the Attending Physician  Scheduled Meds:  Scheduled Meds: . sodium chloride   Intravenous Once  . amLODipine  5 mg Oral QHS  . calcitRIOL  0.25 mcg Oral Daily  . calcium acetate  1,334 mg Oral TID WC  . [START ON 01/18/2015] darbepoetin (ARANESP) injection - DIALYSIS  60 mcg Intravenous Q Tue-HD  . feeding supplement (NEPRO CARB STEADY)  237 mL Oral BID BM  . heparin subcutaneous  5,000 Units Subcutaneous 3 times per day  . insulin aspart  0-15 Units Subcutaneous TID WC  . insulin aspart  0-5 Units Subcutaneous QHS  . pantoprazole  40 mg Oral Q1200    Time spent on care of this patient: 35 mins   Abhiram Criado T , MD   [facility] Office  [phone] Pager - Text Page per Shea Evans as per below:  On-Call/Text Page:      [email or web address]      password [email or web address]  If 7PM-7AM, please contact night-coverage [email or web address] Password [email or web address] 01/14/2015, 8:20 AM   LOS: 3  days

## 2015-01-14 NOTE — Procedures (Signed)
Patient was seen on dialysis and the procedure was supervised.  BFR 300  Via AVF BP is  141/57.   Patient appears to be tolerating treatment well  Jeffrey [name] A 01/14/2015

## 2015-01-15 DIAGNOSIS — Z992 Dependence on renal dialysis: Secondary | ICD-10-CM

## 2015-01-15 LAB — CBC
HEMATOCRIT: 28.2 % — AB (ref 39.0–52.0)
HEMOGLOBIN: 9.2 g/dL — AB (ref 13.0–17.0)
MCH: 29.5 pg (ref 26.0–34.0)
MCHC: 32.6 g/dL (ref 30.0–36.0)
MCV: 90.4 fL (ref 78.0–100.0)
Platelets: 140 10*3/uL — ABNORMAL LOW (ref 150–400)
RBC: 3.12 MIL/uL — ABNORMAL LOW (ref 4.22–5.81)
RDW: 15.4 % (ref 11.5–15.5)
WBC: 7.2 10*3/uL (ref 4.0–10.5)

## 2015-01-15 LAB — TYPE AND SCREEN
ABO/RH(D): O NEG
ANTIBODY SCREEN: NEGATIVE
Unit division: 0
Unit division: 0

## 2015-01-15 LAB — GLUCOSE, CAPILLARY
GLUCOSE-CAPILLARY: 154 mg/dL — AB (ref 70–99)
Glucose-Capillary: 111 mg/dL — ABNORMAL HIGH (ref 70–99)

## 2015-01-15 MED ORDER — INSULIN DETEMIR 100 UNIT/ML FLEXPEN: 8.0000 [IU] | PEN_INJECTOR | Freq: Every day | SUBCUTANEOUS | Status: DC

## 2015-01-15 MED ORDER — HEPARIN SODIUM (PORCINE) 1000 UNIT/ML DIALYSIS: 20.0000 [IU]/kg | INTRAMUSCULAR | Status: DC | PRN

## 2015-01-15 MED ORDER — HYDRALAZINE HCL 50 MG PO TABS: 50.0000 mg | ORAL_TABLET | Freq: Three times a day (TID) | ORAL | Status: DC

## 2015-01-15 MED ORDER — DARBEPOETIN ALFA 60 MCG/0.3ML IJ SOSY: 60.0000 ug | PREFILLED_SYRINGE | INTRAMUSCULAR | Status: DC

## 2015-01-15 MED ORDER — CALCIUM ACETATE 667 MG PO CAPS: 1334.0000 mg | ORAL_CAPSULE | Freq: Three times a day (TID) | ORAL | Status: DC

## 2015-01-15 MED ORDER — PENTAFLUOROPROP-TETRAFLUOROETH EX AERO
INHALATION_SPRAY | CUTANEOUS | Status: AC
  Administered 2015-01-15: 07:00:00
  Filled 2015-01-15: qty 103.5

## 2015-01-15 MED ORDER — PANTOPRAZOLE SODIUM 40 MG PO TBEC: 40.0000 mg | DELAYED_RELEASE_TABLET | Freq: Every day | ORAL | Status: DC

## 2015-01-15 NOTE — Procedures (Signed)
Patient was seen on dialysis and the procedure was supervised.  BFR 250  Via AVF BP is  142/58.   Patient appears to be tolerating treatment well  Ozetta Flatley A 01/15/2015

## 2015-01-15 NOTE — Progress Notes (Signed)
PT Cancellation/Discharge Note  Patient Details Name: Jeffrey [name] MRN: [id number] DOB: Dec 09, 1940   Cancelled Treatment:    Reason Eval/Treat Not Completed: Patient at procedure or test/unavailable;Patient declined, no reason specified.  Patient in HD in am.  This pm, patient declined PT services, stating he is "getting around fine".  "I'm just ready to go home"   RN made aware. PT will sign off.   Jeffrey [name] 01/15/2015, 3:02 PM Jeffrey [name]. Jeffrey [name], [place] Pager [phone]

## 2015-01-15 NOTE — Progress Notes (Signed)
Subjective:  Continues to feel much better than upon admission.  Now on for his fourth HD.  Now seems that pacer has been decided against. He is more mobile.  Has been set up as OP at [facility] is for discharge after HD today   Objective Vital signs in last 24 hours: Filed Vitals:   01/15/15 0703 01/15/15 0730 01/15/15 0800 01/15/15 0830  BP: 139/64 143/55 151/58 138/58  Pulse: 46 46 41 47  Temp:      TempSrc:      Resp:      Height:      Weight:      SpO2:       Weight change: -0.144 kg (-5.1 oz)  Intake/Output Summary (Last 24 hours) at 01/15/15 [id number] Last data filed at 01/15/15 0520  Gross per 24 hour  Intake    835 ml  Output   1530 ml  Net   -695 ml    Assessment/Plan: 74 year old white male with multiple medical issues including advanced CKD now with uremic symptoms 1.Renal- late stage 4 CKD/ now ESRD- patient with uremic symptoms now time to initiate dialysis therapy. Patient understands this. Had first HD 2/16, second 2/17 and third 2/19 and fourth today.  Now seems he wont need a pacer. Has a spot at [facility] on TTS- to be   discharged today after HD and to start there ([facility]) on Tuesday. AVF working well 2. HTN/Vol- heart block - patient now with heart block blood pressure better. He was on a number of antihypertensives in the outpatient setting including labetalol. I have resumed norvasc q HS.  Volume status seems OK 3. Anemia - he has received iron as an outpatient. Hemoglobin is lower than what it has been. Have initiated ESA    Got  blood with HD yesterday  4. Secondary hyperparathyroidism was on calcitriol as an outpatient. Last PTH was actually good at 87. Will continue calcitriol here.  Needs a binder as well, have added phoslo 5. Disposition - for discharge today  Quitman Norberto A    Labs: Basic Metabolic Panel:  Recent Labs Lab 01/12/15 0306 01/13/15 0331 01/14/15 0333  NA 139 140 139  K 4.6 4.2 3.9  CL 107 103 103  CO2 22 26 27   GLUCOSE 89 106*  101*  BUN 111* 70* 66*  CREATININE 8.41* 6.32* 6.56*  CALCIUM 8.1* 8.3* 8.3*  PHOS 8.4* 5.8* 5.5*   Liver Function Tests:  Recent Labs Lab 01/11/15 1000 01/11/15 1103  AST 9 9  ALT 8 8  ALKPHOS 77 82  BILITOT 0.8 0.8  PROT 6.1 6.0  ALBUMIN 3.3* 3.4*   No results for input(s): LIPASE, AMYLASE in the last 168 hours. No results for input(s): AMMONIA in the last 168 hours. CBC:  Recent Labs Lab 01/11/15 1103 01/12/15 0306 01/13/15 0331 01/14/15 0333 01/15/15 0500  WBC 5.2 5.6 6.4 6.4 7.2  NEUTROABS 3.6  --   --   --   --   HGB 8.3* 7.8* 7.9* 7.7* 9.2*  HCT 24.8* 23.9* 24.6* 23.9* 28.2*  MCV 87.0 88.5 89.5 91.2 90.4  PLT 233 191 190 160 140*   Cardiac Enzymes: No results for input(s): CKTOTAL, CKMB, CKMBINDEX, TROPONINI in the last 168 hours. CBG:  Recent Labs Lab 01/13/15 2140 01/14/15 0721 01/14/15 1252 01/14/15 1606 01/14/15 2158  GLUCAP 144* 96 142* 155* 99    Iron Studies:  No results for input(s): IRON, TIBC, TRANSFERRIN, FERRITIN in the last 72 hours. Studies/Results: No  results found. Medications: Infusions:    Scheduled Medications: . amLODipine  10 mg Oral Daily  . calcitRIOL  0.25 mcg Oral Daily  . calcium acetate  1,334 mg Oral TID WC  . [START ON 01/18/2015] darbepoetin (ARANESP) injection - DIALYSIS  60 mcg Intravenous Q Tue-HD  . feeding supplement (NEPRO CARB STEADY)  237 mL Oral BID BM  . heparin subcutaneous  5,000 Units Subcutaneous 3 times per day  . hydrALAZINE  50 mg Oral TID  . insulin aspart  0-15 Units Subcutaneous TID WC  . insulin aspart  0-5 Units Subcutaneous QHS  . pantoprazole  40 mg Oral Q1200    have reviewed scheduled and prn medications.  Physical Exam: General: looks  brighter this AM Heart: irreg Lungs: mostly clear Abdomen: soft, non tender Extremities: no peripheral edema Dialysis Access: left upper arm AVF is patent- minimal bruising    01/15/2015,9:03 AM  LOS: 4 days

## 2015-01-15 NOTE — Discharge Summary (Signed)
DISCHARGE SUMMARY  Jeffrey [name]  MR#: [id number]  DOB:12/12/1940  Date of Admission: 01/11/2015 Date of Discharge: 01/15/2015  Attending Physician:MCCLUNG,JEFFREY T  Patient's WN:[id number] L, MD  Consults: Nephrology  [facility] Cards/EP  Disposition: d/c home   Follow-up Appts:     Follow-up Information    Follow up with Jeffrey L, MD. Schedule an appointment as soon as possible for a visit in 1 week.   Specialty:  Internal Medicine   Contact information:   [address] [place] [address] [phone]       Follow up with Jeffrey Axe, MD. Schedule an appointment as soon as possible for a visit in 2 weeks.   Specialty:  Cardiology   Contact information:   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 Patient was thought to be in complete heart block. Cardiology, Renal and PCCM were consulted. Patient was also noted to be in severe renal failure with K of 5, HCO3 of 15, Cr 10.8 and BUN of 139.  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 w/  hyperkalemia and metabolic acidosis  Per Nephrology - HD has been initiated - has been set up for outpt HD - last inpt tx was on 2/20 - cleared for d/c home per Nephrology    Mobitz Type 1 second degree AV block Per EP his arrhythmia was a Mobitz 1, and not CHB - no plans for pacer at this time - to f/u w/ EP "in a few weeks"  Anemia of CKD Transfused 2/19 in HD - Fe studies within low normal range - f/u CBC as outpt   CAD cardiac catheterization 09/30/14 with "significant coronary obstructive disease with coronary calcification and total occlusion of the proximal to mid RCA with extensive left-to-right collaterals; 50 - 60% stenosis in the proximal LAD after the first diagonal branch, and 30% mid AV groove circumflex stenoses"  DM CBG currently well controlled  HTN BP reasonably controlled - follow w/o change for now w/ ongoing HD   AoS - moderate Per cath 09/30/14 "43 mm gradient in this patient with atrial fibrillation and calculated valve area of 1.72 cm"  Severe malnutrition in the context of acute illness      Medication List    STOP taking these medications        furosemide 80 MG tablet  Commonly known as:  LASIX      TAKE these medications        amLODipine 10 MG tablet  Commonly known as:  NORVASC  Take 10 mg by mouth  daily.     aspirin 81 MG chewable tablet  Chew 81 mg by mouth daily.     calcitRIOL 0.25 MCG capsule  Commonly known as:  ROCALTROL  Take 0.25 mcg by mouth daily.     calcium acetate 667 MG capsule  Commonly known as:  PHOSLO  Take 2 capsules (1,334 mg total) by mouth 3 (three) times daily with meals.     Darbepoetin Alfa 60 MCG/0.3ML Sosy injection  Commonly known as:  ARANESP  Inject 0.3 mLs (60 mcg total) into the vein every Tuesday with hemodialysis.  Start taking on:  01/18/2015     hydrALAZINE 50 MG tablet  Commonly known as:  APRESOLINE  Take 1 tablet (50 mg total) by mouth 3 (three) times daily.     Insulin Detemir 100 UNIT/ML  Pen  Commonly known as:  LEVEMIR  Inject 8 Units into the skin daily at 10 pm.     NITROSTAT 0.4 MG SL tablet  Generic drug:  nitroGLYCERIN  Take 0.4 mg by mouth as needed.     oxyCODONE-acetaminophen 5-325 MG per tablet  Commonly known as:  ROXICET  Take 1 tablet by mouth every 6 (six) hours as needed for severe pain.     simvastatin 20 MG tablet  Commonly known as:  ZOCOR  Take 1 tablet (20 mg total) by mouth at bedtime.        Day of Discharge BP 151/44 mmHg  Pulse 51  Temp(Src) 98 F (36.7 C) (Oral)  Resp 16  Ht 5\' 11"  (1.803 m)  Wt 102.2 kg (225 lb 5 oz)  BMI 31.44 kg/m2  SpO2 97%  Physical Exam: General: No acute respiratory distress Lungs: Clear to auscultation bilaterally without wheezes or crackles Cardiovascular: Regular rate and rhythm without gallop or rub normal S1 and S2 - 3/6 holosystolic M Abdomen: Nontender, nondistended, soft, bowel sounds positive, no rebound, no ascites, no appreciable mass Extremities: No significant cyanosis, clubbing, or edema bilateral lower extremities  Basic Metabolic Panel:  Recent Labs Lab 01/11/15 1000  01/11/15 1600 01/11/15 1922 01/12/15 0306 01/13/15 0331 01/14/15 0333  NA 137  < > 135 139 139 140 139  K 5.1  < > 2.0* 4.3 4.6 4.2 3.9  CL 104  < > 96 105 107 103 103  CO2 14*  < > 29 25 22 26 27   GLUCOSE 98  < > 89 100* 89 106* 101*  BUN 154*  < > <5* 109* 111* 70* 66*  CREATININE 10.88*  < > 0.84 8.39* 8.41* 6.32* 6.56*  CALCIUM 8.3*  < > 7.7* 8.2* 8.1* 8.3* 8.3*  MG 2.9*  --   --   --  2.5 2.4 2.3  PHOS  --   --   --   --  8.4* 5.8* 5.5*  < > = values in this interval not displayed.  Liver Function Tests:  Recent Labs Lab 01/11/15 1000 01/11/15 1103  AST 9 9  ALT 8 8  ALKPHOS 77 82  BILITOT 0.8 0.8  PROT 6.1 6.0  ALBUMIN 3.3* 3.4*   Coags:  Recent Labs Lab 01/11/15 1103  INR 1.33   CBC:  Recent Labs Lab 01/11/15 1103 01/12/15 0306 01/13/15 0331 01/14/15 0333 01/15/15 0500  WBC 5.2  5.6 6.4 6.4 7.2  NEUTROABS 3.6  --   --   --   --   HGB 8.3* 7.8* 7.9* 7.7* 9.2*  HCT 24.8* 23.9* 24.6* 23.9* 28.2*  MCV 87.0 88.5 89.5 91.2 90.4  PLT 233 191 190 160 140*   CBG:  Recent Labs Lab 01/14/15 1252 01/14/15 1606 01/14/15 2158 01/15/15 1016 01/15/15 1210  GLUCAP 142* 155* 99 111* 154*    Recent Results (from the past 240 hour(s))  MRSA PCR Screening     Status: None   Collection Time: 01/11/15  2:05 PM  Result Value Ref Range Status   MRSA by PCR NEGATIVE NEGATIVE Final    Comment:        The GeneXpert MRSA Assay (FDA approved for NASAL specimens only), is one component of a comprehensive MRSA colonization surveillance program. It is not intended to diagnose MRSA infection nor to guide or monitor treatment for MRSA infections.   Clostridium Difficile by PCR     Status: None   Collection Time: 01/12/15 10:15 AM  Result Value Ref Range Status   C difficile by pcr NEGATIVE NEGATIVE Final      Time spent in discharge (includes decision making & examination of pt): >35 minutes  01/15/2015, 1:50 PM   Cherene Altes, MD [facility] Office  [phone] Pager [phone]  On-Call/Text Page:      [email or web address]      password [facility]

## 2015-01-15 NOTE — Progress Notes (Signed)
New Admission Note:   Arrival Method:  VIA WHEELCHAIR Mental Orientation: A&OX4 Telemetry: 2 DEGREE HEART BLOCK Assessment: Completed Skin: INTACT IV: NSL Pain: DENIES Tubes: N/A Safety Measures: Safety Fall Prevention Plan has been given, discussed and signed Admission: Completed [address] Orientation: Patient has been orientated to the room, unit and staff.  Family: NONE PRESENT  Orders have been reviewed and implemented. Will continue to monitor the patient. Call light has been placed within reach and bed alarm has been activated.   Dudley Major BSN, Consulting civil engineer number: [phone]

## 2015-01-15 NOTE — Progress Notes (Signed)
Pt provided with discharge papers including instruction on follow up appointments, new medications, activity level, and diet recommendations. Pt and wife verbalized understanding of all information. Both IV sites were DC'd without complication. VSS. Pt escorted out via wheelchair by this RN.   Tyna Jaksch, RN

## 2015-01-15 NOTE — Discharge Instructions (Signed)
End-Stage Kidney Disease °The kidneys are two organs that lie on either side of the spine between the middle of the back and the front of the abdomen. The kidneys:  °· Remove wastes and extra water from the blood.   °· Produce important hormones. These help keep bones strong, regulate blood pressure, and help create red blood cells.   °· Balance the fluids and chemicals in the blood and tissues. °End-stage kidney disease occurs when the kidneys are so damaged that they cannot do their job. When the kidneys cannot do their job, life-threatening problems occur. The body cannot stay clean and strong without the help of the kidneys. In end-stage kidney disease, the kidneys cannot get better. You need a new kidney or treatments to do some of the work healthy kidneys do in order to stay alive. °CAUSES  °End-stage kidney disease usually occurs when a long-lasting (chronic) kidney disease gets worse. It may also occur after the kidneys are suddenly damaged (acute kidney injury).  °SYMPTOMS  °· Swelling (edema) of the legs, ankles, or feet.   °· Tiredness (lethargy).   °· Nausea or vomiting.   °· Confusion.   °· Problems with urination, such as:   °¨ Decreased urine production.   °¨ Frequent urination, especially at night.   °¨ Frequent accidents in children who are potty trained.   °· Muscle twitches and cramps.   °· Persistent itchiness.   °· Loss of appetite.   °· Headaches.   °· Abnormally dark or light skin.   °· Numbness in the hands or feet.   °· Easy bruising.   °· Frequent hiccups.   °· Menstruation stops. °DIAGNOSIS  °Your health care provider will measure your blood pressure and take some tests. These may include:  °· Urine tests.   °· Blood tests.   °· Imaging tests, such as:   °¨ An ultrasound exam.   °¨ Computed tomography (CT). °· A kidney biopsy. °TREATMENT  °There are two treatments for end-stage kidney disease:  °· A procedure that removes toxic wastes from the body (dialysis).   °· Receiving a new kidney  (kidney transplant). °Both of these treatments have serious risks and consequences. Your health care provider will help you determine which treatment is best for you based on your health, age, and other factors. In addition to having dialysis or a kidney transplant, you may need to take medicines to control high blood pressure (hypertension) and cholesterol and to decrease phosphorus levels in your blood.  °HOME CARE INSTRUCTIONS °· Follow your prescribed diet.   °· Take medicines only as directed by your health care provider.   °· Do not take any new medicines (prescription, over-the-counter, or nutritional supplements) unless approved by your health care provider. Many medicines can worsen your kidney damage or need to have the dose adjusted.   °· Keep all follow-up visits as directed by your health care provider. °MAKE SURE YOU: °· Understand these instructions. °· Will watch your condition. °· Will get help right away if you are not doing well or get worse. °Document Released: 02/02/2004 Document Revised: 03/29/2014 Document Reviewed: 07/11/2012 °ExitCare® Patient Information ©2015 [employer]. This information is not intended to replace advice given to you by your health care provider. Make sure you discuss any questions you have with your health care provider. ° °

## 2015-02-02 ENCOUNTER — Ambulatory Visit (HOSPITAL_COMMUNITY)
Admission: EM | Admit: 2015-02-02 | Discharge: 2015-02-02 | Disposition: A | Discharge status: Home / Self Care | Payer: Medicare Other | Attending: Emergency Medicine | Admitting: Emergency Medicine

## 2015-02-02 ENCOUNTER — Emergency Department (HOSPITAL_COMMUNITY): Payer: Medicare Other

## 2015-02-02 ENCOUNTER — Encounter (HOSPITAL_COMMUNITY)
Admission: EM | Disposition: A | Discharge status: Home / Self Care | Payer: Medicare Other | Source: Home / Self Care | Attending: Emergency Medicine

## 2015-02-02 ENCOUNTER — Telehealth: Payer: Self-pay | Admitting: Vascular Surgery

## 2015-02-02 ENCOUNTER — Emergency Department (HOSPITAL_COMMUNITY): Payer: Medicare Other | Admitting: Anesthesiology

## 2015-02-02 ENCOUNTER — Encounter (HOSPITAL_COMMUNITY): Payer: Self-pay | Admitting: Family Medicine

## 2015-02-02 ENCOUNTER — Other Ambulatory Visit: Payer: Self-pay | Admitting: *Deleted

## 2015-02-02 DIAGNOSIS — Y929 Unspecified place or not applicable: Secondary | ICD-10-CM | POA: Insufficient documentation

## 2015-02-02 DIAGNOSIS — Z6830 Body mass index (BMI) 30.0-30.9, adult: Secondary | ICD-10-CM | POA: Insufficient documentation

## 2015-02-02 DIAGNOSIS — I120 Hypertensive chronic kidney disease with stage 5 chronic kidney disease or end stage renal disease: Secondary | ICD-10-CM | POA: Diagnosis not present

## 2015-02-02 DIAGNOSIS — T148XXA Other injury of unspecified body region, initial encounter: Secondary | ICD-10-CM

## 2015-02-02 DIAGNOSIS — Z9889 Other specified postprocedural states: Secondary | ICD-10-CM | POA: Diagnosis not present

## 2015-02-02 DIAGNOSIS — T82898A Other specified complication of vascular prosthetic devices, implants and grafts, initial encounter: Secondary | ICD-10-CM | POA: Diagnosis not present

## 2015-02-02 DIAGNOSIS — I739 Peripheral vascular disease, unspecified: Secondary | ICD-10-CM | POA: Diagnosis not present

## 2015-02-02 DIAGNOSIS — R079 Chest pain, unspecified: Secondary | ICD-10-CM | POA: Insufficient documentation

## 2015-02-02 DIAGNOSIS — I2510 Atherosclerotic heart disease of native coronary artery without angina pectoris: Secondary | ICD-10-CM | POA: Insufficient documentation

## 2015-02-02 DIAGNOSIS — Z79899 Other long term (current) drug therapy: Secondary | ICD-10-CM | POA: Insufficient documentation

## 2015-02-02 DIAGNOSIS — I4510 Unspecified right bundle-branch block: Secondary | ICD-10-CM | POA: Diagnosis not present

## 2015-02-02 DIAGNOSIS — E785 Hyperlipidemia, unspecified: Secondary | ICD-10-CM | POA: Diagnosis not present

## 2015-02-02 DIAGNOSIS — J449 Chronic obstructive pulmonary disease, unspecified: Secondary | ICD-10-CM | POA: Diagnosis not present

## 2015-02-02 DIAGNOSIS — E119 Type 2 diabetes mellitus without complications: Secondary | ICD-10-CM | POA: Diagnosis not present

## 2015-02-02 DIAGNOSIS — R0789 Other chest pain: Secondary | ICD-10-CM

## 2015-02-02 DIAGNOSIS — G8929 Other chronic pain: Secondary | ICD-10-CM | POA: Diagnosis not present

## 2015-02-02 DIAGNOSIS — Z7982 Long term (current) use of aspirin: Secondary | ICD-10-CM | POA: Diagnosis not present

## 2015-02-02 DIAGNOSIS — I4891 Unspecified atrial fibrillation: Secondary | ICD-10-CM | POA: Insufficient documentation

## 2015-02-02 DIAGNOSIS — Z79891 Long term (current) use of opiate analgesic: Secondary | ICD-10-CM | POA: Diagnosis not present

## 2015-02-02 DIAGNOSIS — Z8673 Personal history of transient ischemic attack (TIA), and cerebral infarction without residual deficits: Secondary | ICD-10-CM | POA: Diagnosis not present

## 2015-02-02 DIAGNOSIS — Z87891 Personal history of nicotine dependence: Secondary | ICD-10-CM | POA: Insufficient documentation

## 2015-02-02 DIAGNOSIS — Z992 Dependence on renal dialysis: Secondary | ICD-10-CM | POA: Diagnosis not present

## 2015-02-02 DIAGNOSIS — M1990 Unspecified osteoarthritis, unspecified site: Secondary | ICD-10-CM | POA: Insufficient documentation

## 2015-02-02 DIAGNOSIS — N186 End stage renal disease: Secondary | ICD-10-CM | POA: Insufficient documentation

## 2015-02-02 DIAGNOSIS — Z794 Long term (current) use of insulin: Secondary | ICD-10-CM | POA: Diagnosis not present

## 2015-02-02 DIAGNOSIS — T82818A Embolism of vascular prosthetic devices, implants and grafts, initial encounter: Secondary | ICD-10-CM | POA: Diagnosis not present

## 2015-02-02 DIAGNOSIS — Z419 Encounter for procedure for purposes other than remedying health state, unspecified: Secondary | ICD-10-CM

## 2015-02-02 DIAGNOSIS — Y832 Surgical operation with anastomosis, bypass or graft as the cause of abnormal reaction of the patient, or of later complication, without mention of misadventure at the time of the procedure: Secondary | ICD-10-CM | POA: Diagnosis not present

## 2015-02-02 DIAGNOSIS — Z4931 Encounter for adequacy testing for hemodialysis: Secondary | ICD-10-CM

## 2015-02-02 HISTORY — PX: INSERTION OF DIALYSIS CATHETER: SHX1324

## 2015-02-02 LAB — CBC WITH DIFFERENTIAL/PLATELET
BASOS ABS: 0 10*3/uL (ref 0.0–0.1)
BASOS PCT: 1 % (ref 0–1)
Eosinophils Absolute: 0.1 10*3/uL (ref 0.0–0.7)
Eosinophils Relative: 2 % (ref 0–5)
HCT: 27.2 % — ABNORMAL LOW (ref 39.0–52.0)
HEMOGLOBIN: 9.1 g/dL — AB (ref 13.0–17.0)
Lymphocytes Relative: 21 % (ref 12–46)
Lymphs Abs: 1.4 10*3/uL (ref 0.7–4.0)
MCH: 30 pg (ref 26.0–34.0)
MCHC: 33.5 g/dL (ref 30.0–36.0)
MCV: 89.8 fL (ref 78.0–100.0)
Monocytes Absolute: 0.6 10*3/uL (ref 0.1–1.0)
Monocytes Relative: 10 % (ref 3–12)
Neutro Abs: 4.3 10*3/uL (ref 1.7–7.7)
Neutrophils Relative %: 66 % (ref 43–77)
PLATELETS: 223 10*3/uL (ref 150–400)
RBC: 3.03 MIL/uL — ABNORMAL LOW (ref 4.22–5.81)
RDW: 15.4 % (ref 11.5–15.5)
WBC: 6.5 10*3/uL (ref 4.0–10.5)

## 2015-02-02 LAB — GLUCOSE, CAPILLARY
Glucose-Capillary: 146 mg/dL — ABNORMAL HIGH (ref 70–99)
Glucose-Capillary: 146 mg/dL — ABNORMAL HIGH (ref 70–99)

## 2015-02-02 LAB — BASIC METABOLIC PANEL
ANION GAP: 12 (ref 5–15)
BUN: 47 mg/dL — AB (ref 6–23)
CO2: 25 mmol/L (ref 19–32)
CREATININE: 5.07 mg/dL — AB (ref 0.50–1.35)
Calcium: 9.1 mg/dL (ref 8.4–10.5)
Chloride: 102 mmol/L (ref 96–112)
GFR calc Af Amer: 12 mL/min — ABNORMAL LOW (ref >90)
GFR calc non Af Amer: 10 mL/min — ABNORMAL LOW (ref >90)
GLUCOSE: 199 mg/dL — AB (ref 70–99)
Potassium: 3.4 mmol/L — ABNORMAL LOW (ref 3.5–5.1)
Sodium: 139 mmol/L (ref 135–145)

## 2015-02-02 LAB — PROTIME-INR
INR: 1.15 (ref 0.00–1.49)
Prothrombin Time: 14.9 seconds (ref 11.6–15.2)

## 2015-02-02 LAB — I-STAT TROPONIN, ED: Troponin i, poc: 0.02 ng/mL (ref 0.00–0.08)

## 2015-02-02 SURGERY — INSERTION OF DIALYSIS CATHETER
Anesthesia: LOCAL | Site: Neck | Laterality: Right

## 2015-02-02 MED ORDER — ONDANSETRON 4 MG PO TBDP: 4.0000 mg | ORAL_TABLET | Freq: Once | ORAL | Status: DC

## 2015-02-02 MED ORDER — SODIUM CHLORIDE 0.9 % IV SOLN
INTRAVENOUS | Status: DC
  Administered 2015-02-02: 13:00:00 via INTRAVENOUS

## 2015-02-02 MED ORDER — EPHEDRINE SULFATE 50 MG/ML IJ SOLN
INTRAMUSCULAR | Status: AC
  Filled 2015-02-02: qty 1

## 2015-02-02 MED ORDER — LIDOCAINE HCL (PF) 1 % IJ SOLN
INTRAMUSCULAR | Status: AC
  Filled 2015-02-02: qty 30

## 2015-02-02 MED ORDER — CEFAZOLIN SODIUM-DEXTROSE 2-3 GM-% IV SOLR
INTRAVENOUS | Status: DC | PRN
  Administered 2015-02-02: 2 g via INTRAVENOUS

## 2015-02-02 MED ORDER — ONDANSETRON HCL 4 MG/2ML IJ SOLN
INTRAMUSCULAR | Status: AC
  Filled 2015-02-02: qty 2

## 2015-02-02 MED ORDER — LIDOCAINE HCL (CARDIAC) 20 MG/ML IV SOLN
INTRAVENOUS | Status: AC
  Filled 2015-02-02: qty 10

## 2015-02-02 MED ORDER — 0.9 % SODIUM CHLORIDE (POUR BTL) OPTIME
TOPICAL | Status: DC | PRN
  Administered 2015-02-02: 1000 mL

## 2015-02-02 MED ORDER — HEPARIN SODIUM (PORCINE) 1000 UNIT/ML IJ SOLN
INTRAMUSCULAR | Status: AC
  Filled 2015-02-02: qty 1

## 2015-02-02 MED ORDER — GLYCOPYRROLATE 0.2 MG/ML IJ SOLN
INTRAMUSCULAR | Status: AC
  Filled 2015-02-02: qty 3

## 2015-02-02 MED ORDER — MEPERIDINE HCL 25 MG/ML IJ SOLN: 6.2500 mg | INTRAMUSCULAR | Status: DC | PRN

## 2015-02-02 MED ORDER — MIDAZOLAM HCL 2 MG/2ML IJ SOLN: 0.5000 mg | Freq: Once | INTRAMUSCULAR | Status: DC | PRN

## 2015-02-02 MED ORDER — SUCCINYLCHOLINE CHLORIDE 20 MG/ML IJ SOLN
INTRAMUSCULAR | Status: AC
  Filled 2015-02-02: qty 1

## 2015-02-02 MED ORDER — ARTIFICIAL TEARS OP OINT
TOPICAL_OINTMENT | OPHTHALMIC | Status: AC
  Filled 2015-02-02: qty 3.5

## 2015-02-02 MED ORDER — HYDROCODONE-ACETAMINOPHEN 5-325 MG PO TABS: 2.0000 | ORAL_TABLET | Freq: Once | ORAL | Status: DC

## 2015-02-02 MED ORDER — FENTANYL CITRATE 0.05 MG/ML IJ SOLN
25.0000 ug | INTRAMUSCULAR | Status: DC | PRN
Start: 2015-02-02 — End: 2015-02-02

## 2015-02-02 MED ORDER — HEPARIN SODIUM (PORCINE) 1000 UNIT/ML IJ SOLN
INTRAMUSCULAR | Status: DC | PRN
  Administered 2015-02-02: 4.2 mL

## 2015-02-02 MED ORDER — MORPHINE SULFATE 4 MG/ML IJ SOLN
4.0000 mg | Freq: Once | INTRAMUSCULAR | Status: AC
  Administered 2015-02-02: 4 mg via INTRAVENOUS
  Filled 2015-02-02: qty 1

## 2015-02-02 MED ORDER — OXYCODONE-ACETAMINOPHEN 5-325 MG PO TABS: 1.0000 | ORAL_TABLET | Freq: Four times a day (QID) | ORAL | Status: DC | PRN

## 2015-02-02 MED ORDER — SODIUM CHLORIDE 0.9 % IJ SOLN
INTRAMUSCULAR | Status: AC
  Filled 2015-02-02: qty 10

## 2015-02-02 MED ORDER — ONDANSETRON HCL 4 MG/2ML IJ SOLN
4.0000 mg | Freq: Once | INTRAMUSCULAR | Status: AC
  Administered 2015-02-02: 4 mg via INTRAVENOUS
  Filled 2015-02-02: qty 2

## 2015-02-02 MED ORDER — CEFAZOLIN SODIUM-DEXTROSE 2-3 GM-% IV SOLR
INTRAVENOUS | Status: AC
Start: 2015-02-02 — End: 2015-02-02
  Filled 2015-02-02: qty 50

## 2015-02-02 MED ORDER — LIDOCAINE HCL (PF) 1 % IJ SOLN
INTRAMUSCULAR | Status: DC | PRN
  Administered 2015-02-02: 12 mL

## 2015-02-02 MED ORDER — ROCURONIUM BROMIDE 50 MG/5ML IV SOLN
INTRAVENOUS | Status: AC
  Filled 2015-02-02: qty 1

## 2015-02-02 MED ORDER — HEPARIN SODIUM (PORCINE) 5000 UNIT/ML IJ SOLN
INTRAMUSCULAR | Status: DC | PRN
  Administered 2015-02-02: 500 mL

## 2015-02-02 MED ORDER — PROMETHAZINE HCL 25 MG/ML IJ SOLN: 6.2500 mg | INTRAMUSCULAR | Status: DC | PRN

## 2015-02-02 SURGICAL SUPPLY — 41 items
BAG DECANTER FOR FLEXI CONT (MISCELLANEOUS) ×2 IMPLANT
BIOPATCH RED 1 DISK 7.0 (GAUZE/BANDAGES/DRESSINGS) ×2 IMPLANT
CATH CANNON HEMO 15F 50CM (CATHETERS) IMPLANT
CATH CANNON HEMO 15FR 19 (HEMODIALYSIS SUPPLIES) ×1 IMPLANT
CATH CANNON HEMO 15FR 23CM (HEMODIALYSIS SUPPLIES) IMPLANT
CATH CANNON HEMO 15FR 31CM (HEMODIALYSIS SUPPLIES) IMPLANT
CATH CANNON HEMO 15FR 32 (HEMODIALYSIS SUPPLIES) IMPLANT
CATH CANNON HEMO 15FR 32CM (HEMODIALYSIS SUPPLIES) IMPLANT
COVER PROBE W GEL 5X96 (DRAPES) IMPLANT
COVER SURGICAL LIGHT HANDLE (MISCELLANEOUS) ×2 IMPLANT
DECANTER SPIKE VIAL GLASS SM (MISCELLANEOUS) ×2 IMPLANT
DRAPE C-ARM 42X72 X-RAY (DRAPES) ×2 IMPLANT
DRAPE CHEST BREAST 15X10 FENES (DRAPES) ×2 IMPLANT
GAUZE SPONGE 2X2 8PLY STRL LF (GAUZE/BANDAGES/DRESSINGS) ×1 IMPLANT
GAUZE SPONGE 4X4 16PLY XRAY LF (GAUZE/BANDAGES/DRESSINGS) ×2 IMPLANT
GLOVE BIOGEL PI IND STRL 6.5 (GLOVE) IMPLANT
GLOVE BIOGEL PI INDICATOR 6.5 (GLOVE) ×2
GLOVE SS BIOGEL STRL SZ 7 (GLOVE) ×1 IMPLANT
GLOVE SUPERSENSE BIOGEL SZ 7 (GLOVE) ×1
GOWN STRL REUS W/ TWL LRG LVL3 (GOWN DISPOSABLE) ×2 IMPLANT
GOWN STRL REUS W/TWL LRG LVL3 (GOWN DISPOSABLE) ×4
KIT BASIN OR (CUSTOM PROCEDURE TRAY) ×2 IMPLANT
KIT ROOM TURNOVER OR (KITS) ×2 IMPLANT
NDL 18GX1X1/2 (RX/OR ONLY) (NEEDLE) ×1 IMPLANT
NDL HYPO 25GX1X1/2 BEV (NEEDLE) ×1 IMPLANT
NEEDLE 18GX1X1/2 (RX/OR ONLY) (NEEDLE) ×2 IMPLANT
NEEDLE 22X1 1/2 (OR ONLY) (NEEDLE) ×2 IMPLANT
NEEDLE HYPO 25GX1X1/2 BEV (NEEDLE) ×2 IMPLANT
NS IRRIG 1000ML POUR BTL (IV SOLUTION) ×2 IMPLANT
PACK SURGICAL SETUP 50X90 (CUSTOM PROCEDURE TRAY) ×2 IMPLANT
PAD ARMBOARD 7.5X6 YLW CONV (MISCELLANEOUS) ×4 IMPLANT
SOAP 2 % CHG 4 OZ (WOUND CARE) ×2 IMPLANT
SPONGE GAUZE 2X2 STER 10/PKG (GAUZE/BANDAGES/DRESSINGS) ×1
SUT ETHILON 3 0 PS 1 (SUTURE) ×2 IMPLANT
SUT VICRYL 4-0 PS2 18IN ABS (SUTURE) ×2 IMPLANT
SYR 20CC LL (SYRINGE) ×2 IMPLANT
SYR 5ML LL (SYRINGE) ×4 IMPLANT
SYR CONTROL 10ML LL (SYRINGE) ×2 IMPLANT
SYRINGE 10CC LL (SYRINGE) ×2 IMPLANT
TAPE CLOTH SURG 4X10 WHT LF (GAUZE/BANDAGES/DRESSINGS) ×1 IMPLANT
WATER STERILE IRR 1000ML POUR (IV SOLUTION) ×2 IMPLANT

## 2015-02-02 NOTE — ED Notes (Addendum)
Pt presents from home with c/o LUE edema. Pt reports went to dialysis yesterday, and when staff inserted needle, pt's fistula started bleeding and arm began to swell/bruise.  Staff was able to control bleeding and instructed him to put ice on arm 31min on/50min off for the rest of the day.  Pt reports that arm has continued to be extremely painful/tender and the edema spread to left shoulder/pectoral area.  Pt is Tue/Thurs/Sat Dialysis pt and did NOT receive any dialysis yesterday.

## 2015-02-02 NOTE — Telephone Encounter (Addendum)
-----   Message from Mena Goes, RN sent at 02/02/2015  1:26 PM EST ----- Regarding: Schedule   ----- Message -----    From: Alvia Grove, PA-C    Sent: 02/02/2015  12:38 PM      To: [facility] Charge Pool  S/p TDC placement 02/02/15  F/u with Dr. Kellie Simmering in 5 weeks.   Thanks, Kim   Notified patient of post op appt. on 03-08-15 at 10:45

## 2015-02-02 NOTE — Anesthesia Postprocedure Evaluation (Signed)
  Anesthesia Post-op Note  Patient: Jeffrey [name]  Procedure(s) Performed: Procedure(s): INSERTION OF DIALYSIS CATHETER  RIGHT INTERNAL JUGULAR (Right)  Patient Location: PACU  Anesthesia Type:MAC  Level of Consciousness: awake, alert , oriented and patient cooperative  Airway and Oxygen Therapy: Patient Spontanous Breathing  Post-op Pain: none  Post-op Assessment: Post-op Vital signs reviewed, Patient's Cardiovascular Status Stable, Respiratory Function Stable, Patent Airway, No signs of Nausea or vomiting and Pain level controlled  Post-op Vital Signs: Reviewed and stable  Last Vitals:  Filed Vitals:   02/02/15 1430  BP: 157/47  Pulse: 34  Temp:   Resp: 16    Complications: No apparent anesthesia complications

## 2015-02-02 NOTE — ED Provider Notes (Signed)
CSN: [id number]     Arrival date & time 02/02/15  0911 History   First MD Initiated Contact with Patient 02/02/15 0913     Chief Complaint  Patient presents with  . Extremity Pain  . Arm Swelling      (Consider location/radiation/quality/duration/timing/severity/associated sxs/prior Treatment) HPI  Jeffrey [name] is a 74 y.o. male with history of end-stage renal disease who began dialysis about 3 weeks ago. He has a pmh of hypertension, diabetes, hyperlipidemia, coronary artery disease status post cardiac catheterization in November 2015 which showed RCA disease with collateral flow. He presents today with complaint of left arm pain. The patient is dialyzed Tuesday, Thursday, Saturday. His last full dialysis was Saturday, 4 days ago. Patient had about 10 minutes of dialysis, before it began bleeding profusely. The patient was had a pressure dressing was placed, ice packs placed. And was discharged home. He was told to apply ice every 15 minutes. The patient began having worsening pain that was progressive all night. Around midnight, he began having pain which extended into the left chest wall. Had associated chills. The patient denies fever, difficulty breathing, nausea, abdominal pain, myalgias. He does not take any anticoagulants.  Past Medical History  Diagnosis Date  . Diabetes mellitus   . Hyperlipidemia   . Hypertension   . Chronic back pain   . End stage renal disease     a. on dialysis, LUE fistula  . Aortic stenosis     a. severe by echo 09/2014  . Anemia   . Atrial fibrillation     a. not well documented, not on anticoagulation  . Coronary artery disease     a. chronically occluded RCA per cath 09/2014 with collaterals  . Peripheral vascular disease   . Arthritis   . Claustrophobia   . Complete heart block     a. 11/205 resolved off AVN blocking agents b. recurred 12/2014  . CVA (cerebral infarction)     a.  no deficit    Past Surgical History  Procedure Laterality Date   . Cholecystectomy    . Esophagogastroduodenoscopy  08/01/2012    Procedure: ESOPHAGOGASTRODUODENOSCOPY (EGD);  Surgeon: [name], MD;  Location: [facility];  Service: Endoscopy;  Laterality: N/A;  . Tonsillectomy    . Colonoscopy w/ biopsies and polypectomy    . Av fistula placement Left 10/19/2014    Procedure: BRACHIOCEPHALIC ARTERIOVENOUS (AV) FISTULA CREATION ;  Surgeon: Conrad Merom, MD;  Location: [address];  Service: Vascular;  Laterality: Left;  . Left and right heart catheterization with coronary angiogram N/A 09/30/2014    Procedure: LEFT AND RIGHT HEART CATHETERIZATION WITH CORONARY ANGIOGRAM;  Surgeon: [name], MD;  Location: [facility] CATH LAB;  Service: Cardiovascular;  Laterality: N/A;   Family History  Problem Relation Age of Onset  . Diabetes Father   . Heart disease Father   . Diabetes Sister    History  Substance Use Topics  . Smoking status: Former Smoker -- 2.00 packs/day for 32 years    Quit date: 11/27/1983  . Smokeless tobacco: Never Used  . Alcohol Use: No     Comment: occasional beer    Review of Systems  Ten systems reviewed and are negative for acute change, except as noted in the HPI.    Allergies  Byetta 10 mcg pen  Home Medications   Prior to Admission medications   Medication Sig Start Date End Date Taking? Authorizing Provider  amLODipine (NORVASC) 10 MG tablet Take 10  mg by mouth daily.      Historical Provider, MD  aspirin 81 MG chewable tablet Chew 81 mg by mouth daily.    Historical Provider, MD  calcitRIOL (ROCALTROL) 0.25 MCG capsule Take 0.25 mcg by mouth daily. 09/02/14   Historical Provider, MD  calcium acetate (PHOSLO) 667 MG capsule Take 2 capsules (1,334 mg total) by mouth 3 (three) times daily with meals. 01/15/15   Cherene Altes, MD  Darbepoetin Alfa (ARANESP) 60 MCG/0.3ML SOSY injection Inject 0.3 mLs (60 mcg total) into the vein every Tuesday with hemodialysis. 01/18/15   Cherene Altes, MD  hydrALAZINE (APRESOLINE) 50 MG  tablet Take 1 tablet (50 mg total) by mouth 3 (three) times daily. 01/15/15   Cherene Altes, MD  Insulin Detemir (LEVEMIR) 100 UNIT/ML Pen Inject 8 Units into the skin daily at 10 pm. 01/15/15   Cherene Altes, MD  nitroGLYCERIN (NITROSTAT) 0.4 MG SL tablet Take 0.4 mg by mouth as needed. 10/16/13   Historical Provider, MD  oxyCODONE-acetaminophen (ROXICET) 5-325 MG per tablet Take 1 tablet by mouth every 6 (six) hours as needed for severe pain. Patient not taking: Reported on 01/11/2015 10/19/14   Alvia Grove, PA-C  simvastatin (ZOCOR) 20 MG tablet Take 1 tablet (20 mg total) by mouth at bedtime. 10/02/14   Theodis Blaze, MD   BP 163/45 mmHg  Pulse 59  Temp(Src) 98.6 F (37 C) (Oral)  Resp 16  Ht 5' 11.5" (1.816 m)  Wt 224 lb 13.9 oz (102 kg)  BMI 30.93 kg/m2  SpO2 99% Physical Exam  Constitutional: He appears well-developed and well-nourished. No distress.  HENT:  Head: Normocephalic and atraumatic.  Eyes: Conjunctivae are normal. No scleral icterus.  Neck: Normal range of motion. Neck supple.  Cardiovascular: Normal rate, regular rhythm, normal heart sounds and intact distal pulses.   Pulmonary/Chest: Effort normal and breath sounds normal. No respiratory distress.  Abdominal: Soft. There is no tenderness.  Musculoskeletal: He exhibits no edema.  Left arm with intact distal pulses, palpable thrill in the antecubital AV graft. There is a tense hematoma on the medial part of the left arm, which is warm, erythematous. Patient has some small vesicles secondary to tape. There is swelling and tenderness into the left axilla and the left pectoralis major with telangiectasia.  Neurological: He is alert.  Skin: Skin is warm and dry. He is not diaphoretic.  Psychiatric: His behavior is normal.  Nursing note and vitals reviewed.       ED Course  Procedures (including critical care time) Labs Review Labs Reviewed  CULTURE, BLOOD (ROUTINE X 2)  CULTURE, BLOOD (ROUTINE X 2)    BASIC METABOLIC PANEL  CBC WITH DIFFERENTIAL/PLATELET  PROTIME-INR    Imaging Review No results found.   EKG Interpretation None      MDM   Final diagnoses:  Chest wall pain    9:59 AM BP 151/57 mmHg  Pulse 58  Temp(Src) 98.6 F (37 C) (Oral)  Resp 16  Ht 5' 11.5" (1.816 m)  Wt 224 lb 13.9 oz (102 kg)  BMI 30.93 kg/m2  SpO2 48%  The 74 year old male with left arm pain. There is a tense hematoma in the left arm. Suspicious for dissection of AV graft with bleeding into the left arm and axilla. We'll check a troponin. He has a normal EKG today. Cultures have been drawn to rule out any infection. There may be an overlying cellulitis versus inflammatory response. Patient will need nephrology and  vascular surgery consults.   10:40 AM BP 151/57 mmHg  Pulse 58  Temp(Src) 98.6 F (37 C) (Oral)  Resp 16  Ht 5' 11.5" (1.816 m)  Wt 224 lb 13.9 oz (102 kg)  BMI 30.93 kg/m2  SpO2 99% Troponin negative. Negative cxr   11:35 AM BP 151/57 mmHg  Pulse 58  Temp(Src) 98.6 F (37 C) (Oral)  Resp 16  Ht 5' 11.5" (1.816 m)  Wt 224 lb 13.9 oz (102 kg)  BMI 30.93 kg/m2  SpO2 99% I spoken with Dr. Posey Pronto regarding the patient's issues. He will consult on the patient. He has asked that vascular surgery be consults it. He suggests the patient may need arteriogram to rule out further bleeding and extravasation. Patient still rates his pain at an elevated platelet level. I will repeat the dose of his morphine. I placed consult to vascular surgery.  12:10 PM BP 151/57 mmHg  Pulse 58  Temp(Src) 98.6 F (37 C) (Oral)  Resp 16  Ht 5' 11.5" (1.816 m)  Wt 224 lb 13.9 oz (102 kg)  BMI 30.93 kg/m2  SpO2 99% Patient will be taken to the OR by Dr. Kellie Simmering for catheter placement. He does not appear to need dialysis today. Plan to discharge after Catheter placement.  Patient last ate a sausage biscuit at 8:30 AM.   Margarita Mail, PA-C 02/02/15 1620

## 2015-02-02 NOTE — Transfer of Care (Signed)
Immediate Anesthesia Transfer of Care Note  Patient: Jeffrey [name]  Procedure(s) Performed: Procedure(s): INSERTION OF DIALYSIS CATHETER  RIGHT INTERNAL JUGULAR (Right)  Patient Location: PACU  Anesthesia Type:MAC  Level of Consciousness: awake and alert   Airway & Oxygen Therapy: Room air  Post-op Assessment: Report given to RN, Post -op Vital signs reviewed and stable and Patient moving all extremities  Post vital signs: Reviewed and stable  Last Vitals:  Filed Vitals:   02/02/15 1200  BP: 165/57  Pulse: 57  Temp:   Resp: 19    Complications: No apparent anesthesia complications

## 2015-02-02 NOTE — Op Note (Signed)
OPERATIVE REPORT  Date of Surgery: 02/02/2015  Surgeon: Tinnie Gens, MD  Assistant: Nurse  Pre-op Diagnosis: End Stage Renal Disease with hematoma left upper arm AV graft  Post-op Diagnosis: Same  Procedure: Procedure(s): #1 bilateral ultrasound localization internal jugular veins #2 insertion tunneled hemodialysis catheter right IJ-19 cm  Anesthesia: Straight local  EBL: Minimal  Complications: None  Procedure Details: The patient was taken the operating are placed in the Trendelenburg position upper chest and neck exposed. Both internal jugular veins were imaged using the mode ultrasound-sono site both noted to be large and widely patent. After prepping and draping in routine sterile manner the right IJ was entered using a supraclavicular approach guidewire passed in the right atrium under fluoroscopic guidance. After dilating the track appropriately a 19 cm tunneled hemodialysis catheter was positioned in the right atrium tunneled peripherally secured with nylon sutures and the wound closed with Vicryl subcuticular fashion sterile dressing applied patient taken to recovery in stable condition for chest x-ray. He tolerated the procedure well   Tinnie Gens, MD 02/02/2015 1:50 PM

## 2015-02-02 NOTE — H&P (Signed)
[facility] History & Physical  Reason for Consult: left upper arm hematoma Referring Physician:  EDP [id number]  History of Present Illness: This is a 74 y.o. male with ESRD on HD TTS  who presents to the [facility] ED today with chest pain and swelling of his left upper arm. He has a left upper arm fistula placed by Dr. Bridgett Larsson on 10/19/14. His fistula started bleeding yesterday during dialysis and his treatment was stopped. The swelling in his left arm started overnight and he started to have chest pain around midnight. He denies any previous bleeding episodes.   He denies any current chest pain or shortness of breath. He is not on any blood thinners. He last a sausage biscuit and cup of coffee around 0830 this morning.   He has a PMH of diabetes mellitus on insulin. His hypertension is managed on norvasc and hydralazine. He has a history of aortic stenosis. He is hyperlipidemia managed on a statin.   Past Medical History  Diagnosis Date  . Diabetes mellitus   . Hyperlipidemia   . Hypertension   . Chronic back pain   . End stage renal disease     a. on dialysis, LUE fistula  . Aortic stenosis     a. severe by echo 09/2014  . Anemia   . Atrial fibrillation     a. not well documented, not on anticoagulation  . Coronary artery disease     a. chronically occluded RCA per cath 09/2014 with collaterals  . Peripheral vascular disease   . Arthritis   . Claustrophobia   . Complete heart block     a. 11/205 resolved off AVN blocking agents b. recurred 12/2014  . CVA (cerebral infarction)     a.  no deficit    Past Surgical History  Procedure Laterality Date  . Cholecystectomy    . Esophagogastroduodenoscopy  08/01/2012    Procedure: ESOPHAGOGASTRODUODENOSCOPY (EGD);  Surgeon: [name], MD;  Location: [facility];  Service: Endoscopy;  Laterality: N/A;  . Tonsillectomy    . Colonoscopy w/ biopsies and polypectomy    . Av fistula placement Left 10/19/2014   Procedure: BRACHIOCEPHALIC ARTERIOVENOUS (AV) FISTULA CREATION ;  Surgeon: Conrad Charlotte, MD;  Location: [place];  Service: Vascular;  Laterality: Left;  . Left and right heart catheterization with coronary angiogram N/A 09/30/2014    Procedure: LEFT AND RIGHT HEART CATHETERIZATION WITH CORONARY ANGIOGRAM;  Surgeon: [name], MD;  Location: [facility] CATH LAB;  Service: Cardiovascular;  Laterality: N/A;    Allergies  Allergen Reactions  . Byetta 10 Mcg Pen [Exenatide] Nausea And Vomiting    Prior to Admission medications   Medication Sig Start Date End Date Taking? Authorizing Provider  amLODipine (NORVASC) 10 MG tablet Take 10 mg by mouth daily.     Yes Historical Provider, MD  aspirin 81 MG chewable tablet Chew 81 mg by mouth daily.   Yes Historical Provider, MD  Insulin Detemir (LEVEMIR) 100 UNIT/ML Pen Inject 8 Units into the skin daily at 10 pm. Patient taking differently: Inject 25 Units into the skin daily at 10 pm.  01/15/15  Yes Cherene Altes, MD  multivitamin (RENA-VIT) TABS tablet Take 1 tablet by mouth daily.   Yes Historical Provider, MD  nitroGLYCERIN (NITROSTAT) 0.4 MG SL tablet Take 0.4 mg by mouth as needed. 10/16/13  Yes Historical Provider, MD  simvastatin (ZOCOR) 20 MG tablet Take 1 tablet (20 mg total) by mouth at bedtime. 10/02/14  Yes Theodis Blaze, MD  calcium acetate (PHOSLO) 667 MG capsule Take 2 capsules (1,334 mg total) by mouth 3 (three) times daily with meals. Patient not taking: Reported on 02/02/2015 01/15/15   Cherene Altes, MD  Darbepoetin Alfa (ARANESP) 60 MCG/0.3ML SOSY injection Inject 0.3 mLs (60 mcg total) into the vein every Tuesday with hemodialysis. Patient not taking: Reported on 02/02/2015 01/18/15   Cherene Altes, MD  hydrALAZINE (APRESOLINE) 50 MG tablet Take 1 tablet (50 mg total) by mouth 3 (three) times daily. Patient not taking: Reported on 02/02/2015 01/15/15   Cherene Altes, MD  oxyCODONE-acetaminophen (ROXICET) 5-325 MG per tablet Take 1  tablet by mouth every 6 (six) hours as needed for severe pain. Patient not taking: Reported on 01/11/2015 10/19/14   Alvia Grove, PA-C    History   Social History  . Marital Status: Married    Spouse Name: N/A  . Number of Children: N/A  . Years of Education: N/A   Occupational History  . Not on file.   Social History Main Topics  . Smoking status: Former Smoker -- 2.00 packs/day for 32 years    Quit date: 11/27/1983  . Smokeless tobacco: Never Used  . Alcohol Use: No     Comment: occasional beer  . Drug Use: No  . Sexual Activity: Not on file   Other Topics Concern  . Not on file   Social History Narrative    Family History  Problem Relation Age of Onset  . Diabetes Father   . Heart disease Father   . Diabetes Sister     ROS: [x]  Positive   [ ]  Negative   [ ]  All sytems reviewed and are negative  General: [ ]  Weight loss, [ ]  Fever, [ ]  chills Neurologic: [ ]  Dizziness, [ ]  Blackouts, [ ]  Seizure [ ]  Stroke, [ ]  "Mini stroke", [ ]  Slurred speech, [ ]  Temporary blindness; [ ]  weakness in arms or legs, [ ]  Hoarseness [x] numbness in feet Cardiac: [ ]  Chest pain/pressure, [ ]  Shortness of breath at rest [ ]  Shortness of breath with exertion, [ ]  Atrial fibrillation or irregular heartbeat [x] shortness of breath lying flat Vascular: [ ]  Pain in legs with walking, [ ]  Pain in legs at rest, [ ]  Pain in legs at night,  [ ]  Non-healing ulcer, [ ]  Blood clot in vein/DVT,   Pulmonary: [ ]  Home oxygen, [ ]  Productive cough, [ ]  Coughing up blood, [ ]  Asthma,  [ ]  Wheezing Musculoskeletal:  [ ]  Arthritis, [ ]  Low back pain, [ ]  Joint pain Hematologic: [ ]  Easy Bruising, [ ]  Anemia; [ ]  Hepatitis Gastrointestinal: [ ]  Blood in stool, [ ]  Gastroesophageal Reflux/heartburn, [ ]  Trouble swallowing Urinary: [ x] chronic Kidney disease, [ x] on HD - [ ]  MWF or [x ] TTHS, [ ]  Burning with urination, [ ]  Difficulty urinating Endocrine: [ ]  hx of diabetes, [ ]  hx of thyroid  disease Skin: [ ]  Rashes, [ ]  Wounds Psychological: [ ]  Anxiety, [ ]  Depression   Physical Examination  Filed Vitals:   02/02/15 0945  BP: 151/57  Pulse: 58  Temp:   Resp: 16   Body mass index is 30.93 kg/(m^2).  General:  WDWN in NAD Gait: Not observed HENT: WNL, normocephalic Pulmonary: normal non-labored breathing , without Rales, rhonchi,  wheezing Cardiac: RRR, systolic murmur,  without carotid bruits Abdomen: soft, NT, no masses Skin: without rashes, without ulcers  Vascular Exam/Pulses:  palpable radial pulses bilaterally. Thrill palpable in left AVF.  Extremities: Left upper arm hematoma and swelling.  Musculoskeletal: no muscle wasting or atrophy  Neurologic: A&O X 3; Appropriate Affect ; SENSATION: normal; MOTOR FUNCTION:  moving all extremities equally. Speech is fluent/normal   CBC    Component Value Date/Time   WBC 6.5 02/02/2015 0945   RBC 3.03* 02/02/2015 0945   HGB 9.1* 02/02/2015 0945   HCT 27.2* 02/02/2015 0945   PLT 223 02/02/2015 0945   MCV 89.8 02/02/2015 0945   MCH 30.0 02/02/2015 0945   MCHC 33.5 02/02/2015 0945   RDW 15.4 02/02/2015 0945   LYMPHSABS 1.4 02/02/2015 0945   MONOABS 0.6 02/02/2015 0945   EOSABS 0.1 02/02/2015 0945   BASOSABS 0.0 02/02/2015 0945    BMET    Component Value Date/Time   NA 139 02/02/2015 0945   K 3.4* 02/02/2015 0945   CL 102 02/02/2015 0945   CO2 25 02/02/2015 0945   GLUCOSE 199* 02/02/2015 0945   BUN 47* 02/02/2015 0945   CREATININE 5.07* 02/02/2015 0945   CALCIUM 9.1 02/02/2015 0945   GFRNONAA 10* 02/02/2015 0945   GFRAA 12* 02/02/2015 0945   ASSESSMENT/PLAN: This is a 74 y.o. male with ESRD on HD TTS with a hematoma of his left upper arm secondary to dialysis cannulation. His chest pain is unlikely cardiac in nature having no acute EKG changes and negative troponin. His CXR is negative except for tiny right pleural effustion. His fistula is still patent with a strong palpable thrill. He will need to rest  his fistula for next 4 weeks for swelling to improve. In the meantime, he will need to dialyze through a catheter. We will proceed to the OR for tunneled dialysis catheter placement under local anesthesia. He will be discharged home afterwards and resume dialysis tomorrow. We will follow up in 5 weeks to determine if his fistula is ready for use.    Virgina Jock, PA-C [facility] Pager: [phone]

## 2015-02-02 NOTE — Consult Note (Addendum)
  This patient is currently on hemodialysis using a left upper arm AV graft placed in December 2015. Yesterday patient had bleeding around puncture site while after only 10 minutes of hemodialysis. He was eventually sent home from dialysis center and developed hematoma in the left upper arm. Comes to the emergency department today with moderate size diffuse hematoma in the left upper arm and needs dialysis access.  On exam there is a diffuse hematoma but pulse and thrill in the graft is excellent. He has a good radial pulse distally in the left upper extremity with no evidence of steal.  Believe the best plan would be to insert a hemodialysis catheter through the right IJ and let this graft hematoma resolve over the next 6 weeks. Plan insertion of dialysis catheter today and we will follow-up patient in office in about 5 weeks

## 2015-02-02 NOTE — ED Provider Notes (Addendum)
Medical screening examination/treatment/procedure(s) were conducted as a shared visit with non-physician practitioner(s) and myself.  I personally evaluated the patient during the encounter.   EKG Interpretation   Date/Time:  Wednesday February 02 2015 09:37:44 EST Ventricular Rate:  65 PR Interval:  289 QRS Duration: 155 QT Interval:  449 QTC Calculation: 467 R Axis:   120 Text Interpretation:  Unknown rhythm, irregular rate Prolonged PR interval  Right bundle branch block No significant change since last tracing  Confirmed by Britania Shreeve  MD, Ross Bender ([id number]) on 02/02/2015 9:51:47 AM      Patient seen by me. Patient was significant hematoma left upper arm possibly involving left upper chest. Patient is dialysis patient normally dialyzed Tuesdays Thursdays and Saturdays yesterday during dialysis started bleeding. They had stopped dialysis. Patient stated he developed swelling in the arm overnight. Started with chest pain around midnight. Suspect that this is bleeding from the graft site. With significant hematoma. Patient still has a thrill in the AV fistula site in the left upper arm.  Would recommend chest x-ray EKG reviewed shows no sniffing changes compared to past EKGs. Patient known to have a history of aortic stenosis. And patient has stated is a dialysis patient. As stated recommend chest x-ray would get a baseline troponin just in case the chest pain is not related to the hematoma. Labs. May need to consult the nephrology as well as vascular surgery.  Patient does have swelling in the left chest area. Most likely due to some mild dissection of blood into that area. Patient is a dialysis patient. Good chance troponin could be elevated. However the chest pain did start around the midnight seem to be delayed from the time of the bleed of the arm area. But does seem to correlate with the timing of the troponin or EKG without any acute changes. Feel that probably not cardiac in nature.   Fredia Sorrow, MD 02/02/15 [id number]  Fredia Sorrow, MD 02/02/15 [phone]

## 2015-02-02 NOTE — Anesthesia Preprocedure Evaluation (Addendum)
Anesthesia Evaluation  Patient identified by MRN, date of birth, ID band Patient awake    Reviewed: Allergy & Precautions, NPO status , Patient's Chart, lab work & pertinent test results  History of Anesthesia Complications Negative for: history of anesthetic complications  Airway Mallampati: I  TM Distance: >3 FB Neck ROM: Full    Dental  (+) Edentulous Upper, Edentulous Lower   Pulmonary COPDformer smoker (quit 1985),  breath sounds clear to auscultation        Cardiovascular hypertension, Pt. on medications - angina+ CAD (occluded RCA) and + Peripheral Vascular Disease + dysrhythmias Atrial Fibrillation + Valvular Problems/Murmurs (11/15 ECHO: severe AS) AS Rhythm:Regular Rate:Normal + Systolic murmurs [id number] ECHO: EF 65-70%, severe aortic stenosis. Peak velocity is 4.43m/s. Mean gradient (S): 41 mm Hg   Neuro/Psych CVA, No Residual Symptoms    GI/Hepatic negative GI ROS, Neg liver ROS,   Endo/Other  diabetes (glu 146), Insulin DependentMorbid obesity  Renal/GU ESRF and DialysisRenal disease (K+ 3.4, unsuccessful dialysis yesterday)     Musculoskeletal   Abdominal (+) + obese,   Peds  Hematology  (+) Blood dyscrasia (Hb 9.1), ,   Anesthesia Other Findings   Reproductive/Obstetrics                          Anesthesia Physical Anesthesia Plan  ASA: III  Anesthesia Plan: MAC   Post-op Pain Management:    Induction:   Airway Management Planned: Natural Airway and Nasal Cannula  Additional Equipment:   Intra-op Plan:   Post-operative Plan:   Informed Consent: I have reviewed the patients History and Physical, chart, labs and discussed the procedure including the risks, benefits and alternatives for the proposed anesthesia with the patient or authorized representative who has indicated his/her understanding and acceptance.     Plan Discussed with: CRNA and Surgeon  Anesthesia Plan  Comments: (Plan routine monitors, local by surgeon)        Anesthesia Quick Evaluation

## 2015-02-03 ENCOUNTER — Encounter (HOSPITAL_COMMUNITY): Payer: Self-pay | Admitting: Vascular Surgery

## 2015-02-03 ENCOUNTER — Other Ambulatory Visit: Payer: Self-pay | Admitting: Internal Medicine

## 2015-02-03 NOTE — Progress Notes (Signed)
Patient is at dialysis, family did not know more than is indicated

## 2015-02-08 LAB — CULTURE, BLOOD (ROUTINE X 2)
CULTURE: NO GROWTH
Culture: NO GROWTH

## 2015-02-14 ENCOUNTER — Encounter: Payer: Self-pay | Admitting: Internal Medicine

## 2015-02-14 ENCOUNTER — Ambulatory Visit (INDEPENDENT_AMBULATORY_CARE_PROVIDER_SITE_OTHER): Payer: Medicare Other | Admitting: Internal Medicine

## 2015-02-14 VITALS — BP 172/50 | HR 53 | Ht 71.0 in | Wt 222.0 lb

## 2015-02-14 DIAGNOSIS — I441 Atrioventricular block, second degree: Secondary | ICD-10-CM

## 2015-02-14 NOTE — Patient Instructions (Signed)
Your physician recommends that you continue on your current medications as directed. Please refer to the Current Medication list given to you today.  Your physician wants you to follow-up in: 4 months with Dr. Caryl Comes. You will receive a reminder letter in the mail two months in advance. If you don't receive a letter, please call our office to schedule the follow-up appointment.

## 2015-02-14 NOTE — Progress Notes (Signed)
Patient Care Team: Thurman Coyer, MD as PCP - General (Internal Medicine) Fleet Contras, MD as Consulting Physician (Nephrology)   HPI  Jeffrey [name] is a 74 y.o. 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  He is admitted with complete heart block and his AV nodal blocking agents were discontinued. This was in November 2015   he was readmitted to/16 symptoms of weakness and bradycardia.  This improved markedly with dialysis.  His aortic stenosis was reviewed and felt to be moderate. Initial numbers suggestive of severe aortic stenosis were felt to be exaggerated because of bradycardia.  Prior to discharge his Heart rates were in the 30s and 40s at rest in 60s with exertion. However, he had no associated symptoms. It was elected to follow and not to pace.  Catheterization 11/15 demonstrated an occluded right coronary artery with left-right collaterals.  Past Medical History  Diagnosis Date  . Diabetes mellitus   . Hyperlipidemia   . Hypertension   . Chronic back pain   . End stage renal disease     a. on dialysis, LUE fistula  . Aortic stenosis     a. severe by echo 09/2014  . Anemia   . Atrial fibrillation     a. not well documented, not on anticoagulation  . Coronary artery disease     a. chronically occluded RCA per cath 09/2014 with collaterals  . Peripheral vascular disease   . Arthritis   . Claustrophobia   . Complete heart block     a. 11/205 resolved off AVN blocking agents b. recurred 12/2014  . CVA (cerebral infarction)     a.  no deficit     Past Surgical History  Procedure Laterality Date  . Cholecystectomy    . Esophagogastroduodenoscopy  08/01/2012    Procedure: ESOPHAGOGASTRODUODENOSCOPY (EGD);  Surgeon: [name], MD;  Location: [facility];  Service: Endoscopy;  Laterality: N/A;  . Tonsillectomy    . Colonoscopy w/ biopsies  and polypectomy    . Av fistula placement Left 10/19/2014    Procedure: BRACHIOCEPHALIC ARTERIOVENOUS (AV) FISTULA CREATION ;  Surgeon: Conrad Wallace, MD;  Location: [facility];  Service: Vascular;  Laterality: Left;  . Left and right heart catheterization with coronary angiogram N/A 09/30/2014    Procedure: LEFT AND RIGHT HEART CATHETERIZATION WITH CORONARY ANGIOGRAM;  Surgeon: [name], MD;  Location: [facility] CATH LAB;  Service: Cardiovascular;  Laterality: N/A;  . Insertion of dialysis catheter Right 02/02/2015    Procedure: INSERTION OF DIALYSIS CATHETER  RIGHT INTERNAL JUGULAR;  Surgeon: Mal [name], MD;  Location: [place];  Service: Vascular;  Laterality: Right;    Current Outpatient Prescriptions  Medication Sig Dispense Refill  . amLODipine (NORVASC) 10 MG tablet Take 10 mg by mouth daily.      [name] aspirin 81 MG chewable tablet Chew 81 mg by mouth daily.    . Insulin Detemir (LEVEMIR) 100 UNIT/ML Pen Inject 8 Units into the skin daily at 10 pm. (Patient taking differently: Inject 20 Units into the skin daily at 10 pm. ) 15 mL 11  . multivitamin (RENA-VIT) TABS tablet Take 1 tablet by mouth daily.    . nitroGLYCERIN (NITROSTAT) 0.4 MG SL tablet Take 0.4 mg by mouth as needed.    . simvastatin (ZOCOR) 20 MG tablet Take 1 tablet (20 mg total) by mouth at bedtime.  30 tablet 1   No current facility-administered medications for this visit.    Allergies  Allergen Reactions  . Byetta 10 Mcg Pen [Exenatide] Nausea And Vomiting    Review of Systems negative except from HPI and PMH  Physical Exam BP 172/50 mmHg  Pulse 53  Ht 5\' 11"  (1.803 m)  Wt 222 lb (100.699 kg)  BMI 30.98 kg/m2 Well developed and well nourished in no acute distress HENT normal E scleral and icterus clear Neck Supple JVP flat; carotids delayed Dialysis catheter in place right subclavian  Clear to ausculation Irregular and slow rate and rhythm,  3/6 harsh systolic murmur Soft with active bowel sounds No clubbing  cyanosis Trace Edema Alert and oriented, grossly normal motor and sensory function Skin Warm and Dry  ECG was ordered today demonstrated sinus rhythm at 75 with 3-2 Mobitz heart block  Assessment and  Plan  Mobitz 1 second degree AV block  Aortic stenosis moderate  Hypertension  End-stage renal disease  AS is stable  The patient is stable.   He continues with asymptomatic second-degree heart block.  Blood pressure remains very elevated. I have been in touch with his nephrologist and his primary care physician whom he should see to dialysis concerning possible up titration of his medications.

## 2015-03-07 ENCOUNTER — Encounter: Payer: Self-pay | Admitting: Vascular Surgery

## 2015-03-08 ENCOUNTER — Encounter: Payer: Self-pay | Admitting: Vascular Surgery

## 2015-03-08 ENCOUNTER — Ambulatory Visit (INDEPENDENT_AMBULATORY_CARE_PROVIDER_SITE_OTHER): Payer: Medicare Other | Admitting: Vascular Surgery

## 2015-03-08 VITALS — BP 178/65 | HR 47 | Ht 71.0 in | Wt 218.2 lb

## 2015-03-08 DIAGNOSIS — N186 End stage renal disease: Secondary | ICD-10-CM

## 2015-03-08 NOTE — Progress Notes (Signed)
Subjective:     Patient ID: Jeffrey [name], male   DOB: 1941/07/07, 74 y.o.   MRN: [id number]  HPI this 74 year old male with end-stage renal disease had a left brachial cephalic fistula created by Dr. Bridgett Larsson in November 2015. Patient developed a significant hematoma during dialysis in early March and I inserted a temporary tunneled hemodialysis catheter in the right IJ. Patient returns today for assessment of the fistula. The hematoma has resolved according to the patient. He has occasional tingling in the left hand but no pain. There are plans to try to utilize the fistula next week on hemodialysis.  Past Medical History  Diagnosis Date  . Diabetes mellitus   . Hyperlipidemia   . Hypertension   . Chronic back pain   . End stage renal disease     a. on dialysis, LUE fistula  . Aortic stenosis     a. severe by echo 09/2014  . Anemia   . Atrial fibrillation     a. not well documented, not on anticoagulation  . Coronary artery disease     a. chronically occluded RCA per cath 09/2014 with collaterals  . Peripheral vascular disease   . Arthritis   . Claustrophobia   . Complete heart block     a. 11/205 resolved off AVN blocking agents b. recurred 12/2014  . CVA (cerebral infarction)     a.  no deficit     History  Substance Use Topics  . Smoking status: Former Smoker -- 2.00 packs/day for 32 years    Quit date: 11/27/1983  . Smokeless tobacco: Never Used  . Alcohol Use: No     Comment: occasional beer    Family History  Problem Relation Age of Onset  . Diabetes Father   . Heart disease Father   . Diabetes Sister     Allergies  Allergen Reactions  . Byetta 10 Mcg Pen [Exenatide] Nausea And Vomiting     Current outpatient prescriptions:  .  amLODipine (NORVASC) 10 MG tablet, Take 10 mg by mouth daily.  , Disp: , Rfl:  .  aspirin 81 MG chewable tablet, Chew 81 mg by mouth daily., Disp: , Rfl:  .  Insulin Detemir (LEVEMIR) 100 UNIT/ML Pen, Inject 8 Units into the skin daily  at 10 pm. (Patient taking differently: Inject 20 Units into the skin daily at 10 pm. ), Disp: 15 mL, Rfl: 11 .  multivitamin (RENA-VIT) TABS tablet, Take 1 tablet by mouth daily., Disp: , Rfl:  .  nitroGLYCERIN (NITROSTAT) 0.4 MG SL tablet, Take 0.4 mg by mouth as needed., Disp: , Rfl:  .  simvastatin (ZOCOR) 20 MG tablet, Take 1 tablet (20 mg total) by mouth at bedtime., Disp: 30 tablet, Rfl: 1  Filed Vitals:   03/08/15 1043 03/08/15 1047  BP: 185/77 178/65  Pulse: 46 47  Height: 5\' 11"  (1.803 m)   Weight: 218 lb 3.2 oz (98.975 kg)   SpO2: 99%     Body mass index is 30.45 kg/(m^2).          Review of Systems   denies chest pain, dyspnea on exertion, hemoptysis  Objective:   Physical Exam BP 178/65 mmHg  Pulse 47  Ht 5\' 11"  (1.803 m)  Wt 218 lb 3.2 oz (98.975 kg)  BMI 30.45 kg/m2  SpO2 99%  Gen. well-developed well-nourished male no apparent stress alert and oriented 3 Left upper arm brachial cephalic fistula with excellent pulse and palpable thrill Hematoma has resolved 1+ radial pulse  palpable with adequately perfused left hand Lungs no rhonchi or wheezing with tunneled hemodialysis catheter in right IJ     Assessment:     Resolution of hematoma left upper arm brachial cephalic fistula Okay to use fistula next week as planned    Plan:     If fistula usage is satisfactory for 2 weeks then notify our office and we will arrange removal of right IJ hemodialysis catheter

## 2015-04-27 ENCOUNTER — Emergency Department (HOSPITAL_COMMUNITY): Payer: Medicare Other

## 2015-04-27 ENCOUNTER — Inpatient Hospital Stay (HOSPITAL_COMMUNITY)
Admission: EM | Admit: 2015-04-27 | Discharge: 2015-04-29 | DRG: 308 | Disposition: A | Discharge status: Home / Self Care | Payer: Medicare Other | Attending: Internal Medicine | Admitting: Internal Medicine

## 2015-04-27 ENCOUNTER — Encounter (HOSPITAL_COMMUNITY): Payer: Self-pay

## 2015-04-27 DIAGNOSIS — E119 Type 2 diabetes mellitus without complications: Secondary | ICD-10-CM | POA: Diagnosis present

## 2015-04-27 DIAGNOSIS — Z833 Family history of diabetes mellitus: Secondary | ICD-10-CM

## 2015-04-27 DIAGNOSIS — R079 Chest pain, unspecified: Secondary | ICD-10-CM

## 2015-04-27 DIAGNOSIS — Z8673 Personal history of transient ischemic attack (TIA), and cerebral infarction without residual deficits: Secondary | ICD-10-CM | POA: Diagnosis not present

## 2015-04-27 DIAGNOSIS — I120 Hypertensive chronic kidney disease with stage 5 chronic kidney disease or end stage renal disease: Secondary | ICD-10-CM | POA: Diagnosis present

## 2015-04-27 DIAGNOSIS — Z992 Dependence on renal dialysis: Secondary | ICD-10-CM | POA: Diagnosis not present

## 2015-04-27 DIAGNOSIS — M549 Dorsalgia, unspecified: Secondary | ICD-10-CM | POA: Diagnosis present

## 2015-04-27 DIAGNOSIS — I2582 Chronic total occlusion of coronary artery: Secondary | ICD-10-CM | POA: Diagnosis present

## 2015-04-27 DIAGNOSIS — Z794 Long term (current) use of insulin: Secondary | ICD-10-CM

## 2015-04-27 DIAGNOSIS — N186 End stage renal disease: Secondary | ICD-10-CM | POA: Diagnosis present

## 2015-04-27 DIAGNOSIS — Z79899 Other long term (current) drug therapy: Secondary | ICD-10-CM

## 2015-04-27 DIAGNOSIS — Z7982 Long term (current) use of aspirin: Secondary | ICD-10-CM

## 2015-04-27 DIAGNOSIS — I272 Other secondary pulmonary hypertension: Secondary | ICD-10-CM | POA: Diagnosis present

## 2015-04-27 DIAGNOSIS — I739 Peripheral vascular disease, unspecified: Secondary | ICD-10-CM | POA: Diagnosis present

## 2015-04-27 DIAGNOSIS — Z87891 Personal history of nicotine dependence: Secondary | ICD-10-CM | POA: Diagnosis not present

## 2015-04-27 DIAGNOSIS — I2510 Atherosclerotic heart disease of native coronary artery without angina pectoris: Secondary | ICD-10-CM | POA: Diagnosis present

## 2015-04-27 DIAGNOSIS — G8929 Other chronic pain: Secondary | ICD-10-CM | POA: Diagnosis present

## 2015-04-27 DIAGNOSIS — E1159 Type 2 diabetes mellitus with other circulatory complications: Secondary | ICD-10-CM

## 2015-04-27 DIAGNOSIS — R001 Bradycardia, unspecified: Secondary | ICD-10-CM | POA: Diagnosis present

## 2015-04-27 DIAGNOSIS — F40240 Claustrophobia: Secondary | ICD-10-CM | POA: Diagnosis present

## 2015-04-27 DIAGNOSIS — I441 Atrioventricular block, second degree: Secondary | ICD-10-CM | POA: Diagnosis present

## 2015-04-27 DIAGNOSIS — Z8249 Family history of ischemic heart disease and other diseases of the circulatory system: Secondary | ICD-10-CM

## 2015-04-27 DIAGNOSIS — E785 Hyperlipidemia, unspecified: Secondary | ICD-10-CM | POA: Diagnosis present

## 2015-04-27 DIAGNOSIS — I25119 Atherosclerotic heart disease of native coronary artery with unspecified angina pectoris: Secondary | ICD-10-CM

## 2015-04-27 DIAGNOSIS — I350 Nonrheumatic aortic (valve) stenosis: Secondary | ICD-10-CM | POA: Diagnosis present

## 2015-04-27 DIAGNOSIS — I442 Atrioventricular block, complete: Secondary | ICD-10-CM | POA: Diagnosis not present

## 2015-04-27 DIAGNOSIS — R7989 Other specified abnormal findings of blood chemistry: Secondary | ICD-10-CM | POA: Diagnosis present

## 2015-04-27 DIAGNOSIS — R778 Other specified abnormalities of plasma proteins: Secondary | ICD-10-CM | POA: Diagnosis present

## 2015-04-27 LAB — BASIC METABOLIC PANEL
Anion gap: 13 (ref 5–15)
BUN: 23 mg/dL — ABNORMAL HIGH (ref 6–20)
CHLORIDE: 94 mmol/L — AB (ref 101–111)
CO2: 30 mmol/L (ref 22–32)
Calcium: 8.6 mg/dL — ABNORMAL LOW (ref 8.9–10.3)
Creatinine, Ser: 3.03 mg/dL — ABNORMAL HIGH (ref 0.61–1.24)
GFR calc non Af Amer: 19 mL/min — ABNORMAL LOW (ref >60)
GFR, EST AFRICAN AMERICAN: 22 mL/min — AB (ref >60)
Glucose, Bld: 104 mg/dL — ABNORMAL HIGH (ref 65–99)
POTASSIUM: 3.2 mmol/L — AB (ref 3.5–5.1)
Sodium: 137 mmol/L (ref 135–145)

## 2015-04-27 LAB — CBC WITH DIFFERENTIAL/PLATELET
Basophils Absolute: 0 10*3/uL (ref 0.0–0.1)
Basophils Relative: 0 % (ref 0–1)
EOS PCT: 3 % (ref 0–5)
Eosinophils Absolute: 0.2 10*3/uL (ref 0.0–0.7)
HEMATOCRIT: 33.6 % — AB (ref 39.0–52.0)
HEMOGLOBIN: 11.7 g/dL — AB (ref 13.0–17.0)
LYMPHS ABS: 1.4 10*3/uL (ref 0.7–4.0)
LYMPHS PCT: 19 % (ref 12–46)
MCH: 30.4 pg (ref 26.0–34.0)
MCHC: 34.8 g/dL (ref 30.0–36.0)
MCV: 87.3 fL (ref 78.0–100.0)
MONOS PCT: 12 % (ref 3–12)
Monocytes Absolute: 1 10*3/uL (ref 0.1–1.0)
Neutro Abs: 5.1 10*3/uL (ref 1.7–7.7)
Neutrophils Relative %: 66 % (ref 43–77)
PLATELETS: 214 10*3/uL (ref 150–400)
RBC: 3.85 MIL/uL — ABNORMAL LOW (ref 4.22–5.81)
RDW: 13.5 % (ref 11.5–15.5)
WBC: 7.7 10*3/uL (ref 4.0–10.5)

## 2015-04-27 LAB — APTT: APTT: 35 s (ref 24–37)

## 2015-04-27 LAB — PROTIME-INR
INR: 1.19 (ref 0.00–1.49)
Prothrombin Time: 15.3 seconds — ABNORMAL HIGH (ref 11.6–15.2)

## 2015-04-27 LAB — GLUCOSE, CAPILLARY: Glucose-Capillary: 192 mg/dL — ABNORMAL HIGH (ref 65–99)

## 2015-04-27 LAB — TROPONIN I: Troponin I: 0.05 ng/mL — ABNORMAL HIGH (ref <0.031)

## 2015-04-27 LAB — MRSA PCR SCREENING: MRSA BY PCR: NEGATIVE

## 2015-04-27 MED ORDER — NITROGLYCERIN 0.4 MG SL SUBL: 0.4000 mg | SUBLINGUAL_TABLET | SUBLINGUAL | Status: DC | PRN

## 2015-04-27 MED ORDER — HEPARIN SODIUM (PORCINE) 5000 UNIT/ML IJ SOLN
5000.0000 [IU] | Freq: Three times a day (TID) | INTRAMUSCULAR | Status: DC
  Administered 2015-04-27 – 2015-04-29 (×5): 5000 [IU] via SUBCUTANEOUS
  Filled 2015-04-27 (×8): qty 1

## 2015-04-27 MED ORDER — AMLODIPINE BESYLATE 10 MG PO TABS
10.0000 mg | ORAL_TABLET | Freq: Every day | ORAL | Status: DC
  Administered 2015-04-27: 10 mg via ORAL
  Filled 2015-04-27 (×2): qty 1

## 2015-04-27 MED ORDER — RENA-VITE PO TABS
1.0000 | ORAL_TABLET | Freq: Every day | ORAL | Status: DC
  Administered 2015-04-28 – 2015-04-29 (×2): 1 via ORAL
  Filled 2015-04-27 (×2): qty 1

## 2015-04-27 MED ORDER — ASPIRIN 300 MG RE SUPP: 300.0000 mg | RECTAL | Status: AC

## 2015-04-27 MED ORDER — INSULIN ASPART 100 UNIT/ML ~~LOC~~ SOLN
0.0000 [IU] | Freq: Three times a day (TID) | SUBCUTANEOUS | Status: DC
  Administered 2015-04-28: 5 [IU] via SUBCUTANEOUS

## 2015-04-27 MED ORDER — ASPIRIN 81 MG PO CHEW: 324.0000 mg | CHEWABLE_TABLET | ORAL | Status: AC

## 2015-04-27 MED ORDER — ASPIRIN 81 MG PO CHEW
81.0000 mg | CHEWABLE_TABLET | Freq: Every day | ORAL | Status: DC
  Administered 2015-04-28 – 2015-04-29 (×2): 81 mg via ORAL
  Filled 2015-04-27 (×2): qty 1

## 2015-04-27 MED ORDER — ASPIRIN EC 81 MG PO TBEC: 81.0000 mg | DELAYED_RELEASE_TABLET | Freq: Every day | ORAL | Status: DC

## 2015-04-27 MED ORDER — INSULIN DETEMIR 100 UNIT/ML ~~LOC~~ SOLN
35.0000 [IU] | Freq: Every day | SUBCUTANEOUS | Status: DC
  Administered 2015-04-28: 35 [IU] via SUBCUTANEOUS
  Filled 2015-04-27 (×3): qty 0.35

## 2015-04-27 MED ORDER — SODIUM CHLORIDE 0.9 % IV SOLN
INTRAVENOUS | Status: DC
  Administered 2015-04-27 – 2015-04-28 (×2): via INTRAVENOUS

## 2015-04-27 MED ORDER — ONDANSETRON HCL 4 MG/2ML IJ SOLN: 4.0000 mg | Freq: Four times a day (QID) | INTRAMUSCULAR | Status: DC | PRN

## 2015-04-27 MED ORDER — ACETAMINOPHEN 325 MG PO TABS: 650.0000 mg | ORAL_TABLET | ORAL | Status: DC | PRN

## 2015-04-27 MED ORDER — INSULIN DETEMIR 100 UNIT/ML FLEXPEN: 35.0000 [IU] | PEN_INJECTOR | Freq: Every day | SUBCUTANEOUS | Status: DC

## 2015-04-27 MED ORDER — SIMVASTATIN 20 MG PO TABS
20.0000 mg | ORAL_TABLET | Freq: Every day | ORAL | Status: DC
  Administered 2015-04-27 – 2015-04-28 (×2): 20 mg via ORAL
  Filled 2015-04-27 (×3): qty 1

## 2015-04-27 NOTE — H&P (Signed)
Jeffrey [name] is an 74 y.o. male.   Primary Cardiologist: Dr. Caryl Comes PMD: Chief Complaint: Chest pain HPI: 74 year old man with known coronary artery disease. He had a cath showing an occluded RCA in November 2015 with left to right collaterals. He was at dialysis today. He had chest discomfort which felt like a band around his upper chest. He also had some hypotension during dialysis with systolics around 88. He had bradycardia into the high 30s.  He was hospitalized several months ago and had high degree heart block noted at that time. Pacemaker was discussed but he was managed medically since he was asymptomatic. At this time, he denies lightheadedness or passing out spells. He does occasionally have lightheadedness when he stands up. This lasts just a few seconds and then he can walk with a cane. He has not passed out.  Past Medical History  Diagnosis Date  . Diabetes mellitus   . Hyperlipidemia   . Hypertension   . Chronic back pain   . End stage renal disease     a. on dialysis, LUE fistula  . Aortic stenosis     a. severe by echo 09/2014  . Anemia   . Atrial fibrillation     a. not well documented, not on anticoagulation  . Coronary artery disease     a. chronically occluded RCA per cath 09/2014 with collaterals  . Peripheral vascular disease   . Arthritis   . Claustrophobia   . Complete heart block     a. 11/205 resolved off AVN blocking agents b. recurred 12/2014  . CVA (cerebral infarction)     a.  no deficit     Past Surgical History  Procedure Laterality Date  . Cholecystectomy    . Esophagogastroduodenoscopy  08/01/2012    Procedure: ESOPHAGOGASTRODUODENOSCOPY (EGD);  Surgeon: [name], MD;  Location: [facility];  Service: Endoscopy;  Laterality: N/A;  . Tonsillectomy    . Colonoscopy w/ biopsies and polypectomy    . Av fistula placement Left 10/19/2014    Procedure: BRACHIOCEPHALIC ARTERIOVENOUS (AV) FISTULA CREATION ;  Surgeon: Conrad Hamtramck, MD;  Location:  [facility];  Service: Vascular;  Laterality: Left;  . Left and right heart catheterization with coronary angiogram N/A 09/30/2014    Procedure: LEFT AND RIGHT HEART CATHETERIZATION WITH CORONARY ANGIOGRAM;  Surgeon: [name], MD;  Location: [facility] CATH LAB;  Service: Cardiovascular;  Laterality: N/A;  . Insertion of dialysis catheter Right 02/02/2015    Procedure: INSERTION OF DIALYSIS CATHETER  RIGHT INTERNAL JUGULAR;  Surgeon: Mal [name], MD;  Location: [place];  Service: Vascular;  Laterality: Right;    Family History  Problem Relation Age of Onset  . Diabetes Father   . Heart disease Father   . Diabetes Sister    Social History:  reports that he quit smoking about 31 years ago. He has never used smokeless tobacco. He reports that he does not drink alcohol or use illicit drugs.  Allergies:  Allergies  Allergen Reactions  . Byetta 10 Mcg Pen [Exenatide] Nausea And Vomiting     (Not in a hospital admission)  Results for orders placed or performed during the hospital encounter of 04/27/15 (from the past 48 hour(s))  Troponin I     Status: Abnormal   Collection Time: 04/27/15  4:49 PM  Result Value Ref Range   Troponin I 0.05 (H) <0.031 ng/mL    Comment:        PERSISTENTLY INCREASED TROPONIN VALUES IN  THE RANGE OF 0.04-0.49 ng/mL CAN BE SEEN IN:       -UNSTABLE ANGINA       -CONGESTIVE HEART FAILURE       -MYOCARDITIS       -CHEST TRAUMA       -ARRYHTHMIAS       -LATE PRESENTING MYOCARDIAL INFARCTION       -COPD   CLINICAL FOLLOW-UP RECOMMENDED.   CBC with Differential/Platelet     Status: Abnormal   Collection Time: 04/27/15  4:49 PM  Result Value Ref Range   WBC 7.7 4.0 - 10.5 K/uL   RBC 3.85 (L) 4.22 - 5.81 MIL/uL   Hemoglobin 11.7 (L) 13.0 - 17.0 g/dL   HCT 33.6 (L) 39.0 - 52.0 %   MCV 87.3 78.0 - 100.0 fL   MCH 30.4 26.0 - 34.0 pg   MCHC 34.8 30.0 - 36.0 g/dL   RDW 13.5 11.5 - 15.5 %   Platelets 214 150 - 400 K/uL   Neutrophils Relative % 66 43 - 77 %   Neutro  Abs 5.1 1.7 - 7.7 K/uL   Lymphocytes Relative 19 12 - 46 %   Lymphs Abs 1.4 0.7 - 4.0 K/uL   Monocytes Relative 12 3 - 12 %   Monocytes Absolute 1.0 0.1 - 1.0 K/uL   Eosinophils Relative 3 0 - 5 %   Eosinophils Absolute 0.2 0.0 - 0.7 K/uL   Basophils Relative 0 0 - 1 %   Basophils Absolute 0.0 0.0 - 0.1 K/uL  Basic metabolic panel     Status: Abnormal   Collection Time: 04/27/15  4:49 PM  Result Value Ref Range   Sodium 137 135 - 145 mmol/L   Potassium 3.2 (L) 3.5 - 5.1 mmol/L   Chloride 94 (L) 101 - 111 mmol/L   CO2 30 22 - 32 mmol/L   Glucose, Bld 104 (H) 65 - 99 mg/dL   BUN 23 (H) 6 - 20 mg/dL   Creatinine, Ser 3.03 (H) 0.61 - 1.24 mg/dL   Calcium 8.6 (L) 8.9 - 10.3 mg/dL   GFR calc non Af Amer 19 (L) >60 mL/min   GFR calc Af Amer 22 (L) >60 mL/min    Comment: (NOTE) The eGFR has been calculated using the CKD EPI equation. This calculation has not been validated in all clinical situations. eGFR's persistently <60 mL/min signify possible Chronic Kidney Disease.    Anion gap 13 5 - 15   Dg Chest Port 1 View  04/27/2015   CLINICAL DATA:  Initial encounter for 1 day history of chest pain  EXAM: PORTABLE CHEST - 1 VIEW  COMPARISON:  02/02/2015.  FINDINGS: 1645 hours. Lordotic positioning with low lung volumes. No focal consolidation or pulmonary edema. No pleural effusion. Vascular congestion noted without overt pulmonary edema. Cardiopericardial silhouette is at upper limits of normal for size. Telemetry leads overlie the chest.  IMPRESSION: Low volume film.  Cardiomegaly with vascular congestion.   Electronically Signed   By: [name] Stanley M.D.   On: 04/27/2015 16:51    ROS:   OBJECTIVE:   Vitals:   Filed Vitals:   04/27/15 1604 04/27/15 1707 04/27/15 1715 04/27/15 1750  BP: 152/44 146/43 167/36 111/65  Pulse: 47 44 71 62  Temp: 97.4 F (36.3 C)     TempSrc: Oral     Resp: '16 14 14 16  ' SpO2: 99% 99% 98% 98%   I&O's:  No intake or output data in the 24 hours ending  04/27/15  1820 TELEMETRY: Reviewed telemetry pt in : NSR with intermittent complete heart block     PHYSICAL EXAM General: Well developed, well nourished, in no acute distress Head:   Normal cephalic and atramatic  Lungs:   Clear bilaterally to auscultation. Heart:  Slightly irregular, S1 S2  No JVD.   Abdomen: abdomen soft and non-tender Msk:  Back normal,  Normal strength and tone for age. Extremities:   No edema.   Neuro: Alert and oriented. Psych:  Normal affect, responds appropriately  LABS: Basic Metabolic Panel:  Recent Labs  04/27/15 1649  NA 137  K 3.2*  CL 94*  CO2 30  GLUCOSE 104*  BUN 23*  CREATININE 3.03*  CALCIUM 8.6*   Liver Function Tests: No results for input(s): AST, ALT, ALKPHOS, BILITOT, PROT, ALBUMIN in the last 72 hours. No results for input(s): LIPASE, AMYLASE in the last 72 hours. CBC:  Recent Labs  04/27/15 1649  WBC 7.7  NEUTROABS 5.1  HGB 11.7*  HCT 33.6*  MCV 87.3  PLT 214   Cardiac Enzymes:  Recent Labs  04/27/15 1649  TROPONINI 0.05*   BNP: Invalid input(s): POCBNP D-Dimer: No results for input(s): DDIMER in the last 72 hours. Hemoglobin A1C: No results for input(s): HGBA1C in the last 72 hours. Fasting Lipid Panel: No results for input(s): CHOL, HDL, LDLCALC, TRIG, CHOLHDL, LDLDIRECT in the last 72 hours. Thyroid Function Tests: No results for input(s): TSH, T4TOTAL, T3FREE, THYROIDAB in the last 72 hours.  Invalid input(s): FREET3 Anemia Panel: No results for input(s): VITAMINB12, FOLATE, FERRITIN, TIBC, IRON, RETICCTPCT in the last 72 hours. Coag Panel:   Lab Results  Component Value Date   INR 1.15 02/02/2015   INR 1.33 01/11/2015   INR 1.12 09/29/2014       Assessment/Plan Chest discomfort: Troponin slightly elevated. This is of unclear significance given his renal disease. We will rule out for MI with serial enzymes. Watch on telemetry in the stepdown unit.  Bradycardia: His blood pressure is stable.  Although there is a conduction abnormality, I don't think he needs temporary pacemaker. Will see how he does overnight. Will consider having EP look at the patient again tomorrow. Its difficult to get a sense of whether his symptoms are coming from any type of bradycardia. He does have lightheadedness but this seems reasonable for the patient while he is on dialysis, or for a few seconds after standing. I am not convinced that a pacemaker would help with those types of symptoms.  Diabetes: Sliding scale insulin. No nighttime coverage. Continuing Levemir per his home dose. Will make nothing by mouth past midnight in the event he does require procedure. Glucose checks 4 times daily.  CAD: Cath results as noted above. He was not having typical angina with exertion prior to this. At this time, medical therapy is limited by his heart rate. Cannot add beta blocker.  Gabriele Loveland S. 04/27/2015, 6:20 PM

## 2015-04-27 NOTE — ED Notes (Signed)
[facility]- pt coming from dialysis began having chest pain with 20 min left of dialysis. Pt reports a discomfort and sharp pain. Pt had MI in December. 324 ASA and 2 nitro en route.

## 2015-04-27 NOTE — ED Provider Notes (Signed)
CSN: [id number]     Arrival date & time 04/27/15  1553 History   First MD Initiated Contact with Patient 04/27/15 1617     Chief Complaint  Patient presents with  . Chest Pain     (Consider location/radiation/quality/duration/timing/severity/associated sxs/prior Treatment) Patient is a 74 y.o. male presenting with chest pain. The history is provided by the patient and the EMS personnel.  Chest Pain Associated symptoms: shortness of breath   Associated symptoms: no abdominal pain, no back pain, no fever, no headache and no nausea    patient is a dialysis patient. Patient normally dialyzed on on day Wednesdays and Fridays. Today at dialysis started with chest pain about 20 minutes prior to the end of dialysis. Lasted for at least 30 minutes. Resolved with aspirin provided by EMS and sublingual nitroglycerin. Patient is now pain-free. The pain was left side of chest. Was described to be 6 out of 10. Associated with shortness of breath no nausea. Patient was seen several months ago for complete heart block by Dr. Caryl Comes while in the hospital they opted not to do a pacemaker. Patient currently has no symptoms.  Past Medical History  Diagnosis Date  . Diabetes mellitus   . Hyperlipidemia   . Hypertension   . Chronic back pain   . End stage renal disease     a. on dialysis, LUE fistula  . Aortic stenosis     a. severe by echo 09/2014  . Anemia   . Atrial fibrillation     a. not well documented, not on anticoagulation  . Coronary artery disease     a. chronically occluded RCA per cath 09/2014 with collaterals  . Peripheral vascular disease   . Arthritis   . Claustrophobia   . Complete heart block     a. 11/205 resolved off AVN blocking agents b. recurred 12/2014  . CVA (cerebral infarction)     a.  no deficit    Past Surgical History  Procedure Laterality Date  . Cholecystectomy    . Esophagogastroduodenoscopy  08/01/2012    Procedure: ESOPHAGOGASTRODUODENOSCOPY (EGD);  Surgeon: [name], MD;  Location: [facility];  Service: Endoscopy;  Laterality: N/A;  . Tonsillectomy    . Colonoscopy w/ biopsies and polypectomy    . Av fistula placement Left 10/19/2014    Procedure: BRACHIOCEPHALIC ARTERIOVENOUS (AV) FISTULA CREATION ;  Surgeon: Conrad Middleway, MD;  Location: [place];  Service: Vascular;  Laterality: Left;  . Left and right heart catheterization with coronary angiogram N/A 09/30/2014    Procedure: LEFT AND RIGHT HEART CATHETERIZATION WITH CORONARY ANGIOGRAM;  Surgeon: [name], MD;  Location: [facility] CATH LAB;  Service: Cardiovascular;  Laterality: N/A;  . Insertion of dialysis catheter Right 02/02/2015    Procedure: INSERTION OF DIALYSIS CATHETER  RIGHT INTERNAL JUGULAR;  Surgeon: Mal [name], MD;  Location: [place];  Service: Vascular;  Laterality: Right;   Family History  Problem Relation Age of Onset  . Diabetes Father   . Heart disease Father   . Diabetes Sister    History  Substance Use Topics  . Smoking status: Former Smoker -- 2.00 packs/day for 32 years    Quit date: 11/27/1983  . Smokeless tobacco: Never Used  . Alcohol Use: No     Comment: occasional beer    Review of Systems  Constitutional: Negative for fever.  HENT: Negative for congestion.   Eyes: Negative for visual disturbance.  Respiratory: Positive for shortness of breath.  Cardiovascular: Positive for chest pain.  Gastrointestinal: Negative for nausea and abdominal pain.  Musculoskeletal: Negative for back pain.  Skin: Negative for rash.  Neurological: Negative for headaches.  Hematological: Bruises/bleeds easily.  Psychiatric/Behavioral: Negative for confusion.      Allergies  Byetta 10 mcg pen  Home Medications   Prior to Admission medications   Medication Sig Start Date End Date Taking? Authorizing Provider  amLODipine (NORVASC) 10 MG tablet Take 10 mg by mouth daily.     Yes Historical Provider, MD  aspirin 81 MG chewable tablet Chew 81 mg by mouth daily.   Yes  Historical Provider, MD  Insulin Detemir (LEVEMIR) 100 UNIT/ML Pen Inject 8 Units into the skin daily at 10 pm. Patient taking differently: Inject 35 Units into the skin daily at 10 pm.  01/15/15  Yes Cherene Altes, MD  multivitamin (RENA-VIT) TABS tablet Take 1 tablet by mouth daily.   Yes Historical Provider, MD  nitroGLYCERIN (NITROSTAT) 0.4 MG SL tablet Take 0.4 mg by mouth as needed. 10/16/13  Yes Historical Provider, MD  simvastatin (ZOCOR) 20 MG tablet Take 1 tablet (20 mg total) by mouth at bedtime. 10/02/14  Yes Theodis Blaze, MD   BP 163/41 mmHg  Pulse 41  Temp(Src) 97.4 F (36.3 C) (Oral)  Resp 24  SpO2 97% Physical Exam  Constitutional: He is oriented to person, place, and time. He appears well-developed and well-nourished. No distress.  HENT:  Head: Normocephalic and atraumatic.  Eyes: Conjunctivae and EOM are normal. Pupils are equal, round, and reactive to light.  Neck: Normal range of motion.  Cardiovascular: Normal rate, regular rhythm and intact distal pulses.   No murmur heard. Pulmonary/Chest: Effort normal and breath sounds normal. No respiratory distress.  Abdominal: Soft. Bowel sounds are normal. There is no tenderness.  Musculoskeletal: Normal range of motion.  Dialysis AV fistula left arm. Good thrill.  Neurological: He is alert and oriented to person, place, and time. No cranial nerve deficit. He exhibits normal muscle tone. Coordination normal.  Skin: Skin is warm. No erythema.  Nursing note and vitals reviewed.   ED Course  Procedures (including critical care time) Labs Review Labs Reviewed  TROPONIN I - Abnormal; Notable for the following:    Troponin I 0.05 (*)    All other components within normal limits  CBC WITH DIFFERENTIAL/PLATELET - Abnormal; Notable for the following:    RBC 3.85 (*)    Hemoglobin 11.7 (*)    HCT 33.6 (*)    All other components within normal limits  BASIC METABOLIC PANEL - Abnormal; Notable for the following:     Potassium 3.2 (*)    Chloride 94 (*)    Glucose, Bld 104 (*)    BUN 23 (*)    Creatinine, Ser 3.03 (*)    Calcium 8.6 (*)    GFR calc non Af Amer 19 (*)    GFR calc Af Amer 22 (*)    All other components within normal limits  TROPONIN I  TROPONIN I  TROPONIN I   Results for orders placed or performed during the hospital encounter of 04/27/15  Troponin I  Result Value Ref Range   Troponin I 0.05 (H) <0.031 ng/mL  CBC with Differential/Platelet  Result Value Ref Range   WBC 7.7 4.0 - 10.5 K/uL   RBC 3.85 (L) 4.22 - 5.81 MIL/uL   Hemoglobin 11.7 (L) 13.0 - 17.0 g/dL   HCT 33.6 (L) 39.0 - 52.0 %   MCV 87.3  78.0 - 100.0 fL   MCH 30.4 26.0 - 34.0 pg   MCHC 34.8 30.0 - 36.0 g/dL   RDW 13.5 11.5 - 15.5 %   Platelets 214 150 - 400 K/uL   Neutrophils Relative % 66 43 - 77 %   Neutro Abs 5.1 1.7 - 7.7 K/uL   Lymphocytes Relative 19 12 - 46 %   Lymphs Abs 1.4 0.7 - 4.0 K/uL   Monocytes Relative 12 3 - 12 %   Monocytes Absolute 1.0 0.1 - 1.0 K/uL   Eosinophils Relative 3 0 - 5 %   Eosinophils Absolute 0.2 0.0 - 0.7 K/uL   Basophils Relative 0 0 - 1 %   Basophils Absolute 0.0 0.0 - 0.1 K/uL  Basic metabolic panel  Result Value Ref Range   Sodium 137 135 - 145 mmol/L   Potassium 3.2 (L) 3.5 - 5.1 mmol/L   Chloride 94 (L) 101 - 111 mmol/L   CO2 30 22 - 32 mmol/L   Glucose, Bld 104 (H) 65 - 99 mg/dL   BUN 23 (H) 6 - 20 mg/dL   Creatinine, Ser 3.03 (H) 0.61 - 1.24 mg/dL   Calcium 8.6 (L) 8.9 - 10.3 mg/dL   GFR calc non Af Amer 19 (L) >60 mL/min   GFR calc Af Amer 22 (L) >60 mL/min   Anion gap 13 5 - 15     Imaging Review Dg Chest Port 1 View  04/27/2015   CLINICAL DATA:  Initial encounter for 1 day history of chest pain  EXAM: PORTABLE CHEST - 1 VIEW  COMPARISON:  02/02/2015.  FINDINGS: 1645 hours. Lordotic positioning with low lung volumes. No focal consolidation or pulmonary edema. No pleural effusion. Vascular congestion noted without overt pulmonary edema. Cardiopericardial  silhouette is at upper limits of normal for size. Telemetry leads overlie the chest.  IMPRESSION: Low volume film.  Cardiomegaly with vascular congestion.   Electronically Signed   By: [name] Stanley M.D.   On: 04/27/2015 16:51     EKG Interpretation   Date/Time:  Wednesday April 27 2015 17:15:13 EDT Ventricular Rate:  43 PR Interval:  66 QRS Duration: 167 QT Interval:  517 QTC Calculation: 437 R Axis:   128 Text Interpretation:  Complete AV block with wide QRS complex Right bundle  branch block Confirmed by Ayannah Faddis  MD, Lanna Labella ([id number]) on 04/27/2015 5:50:57  PM     Repeat EKG consistent with complete heart block.     MDM   Final diagnoses:  Chest pain  Complete heart block    Patient with no further chest pain after receiving the sublingual nitroglycerin and aspirin by EMS. Patient also unmonitored noted to be in complete heart block. Repeat EKG was consistent with that. Cardiology was called. They will admit for cardiac rule out and for further monitoring of the heart block. Patient is not a candidate at this time of for pacemaker insertion. Patient showing no comp locating factors and no symptoms.  Patient noted to have a slight increase in troponin. Serial troponins will be done. Sometimes dialysis patient will have a slight increase in troponin. If this continues to increase could be significant.      Fredia Sorrow, MD 04/27/15 [phone]

## 2015-04-27 NOTE — ED Notes (Signed)
Pt placed on Zoll Monitor.

## 2015-04-27 NOTE — ED Notes (Signed)
Pt HR in the low 40's, second EKG captured and given to EDP. Pt denying chest pain or discomfort at this time. No distress noted.

## 2015-04-28 ENCOUNTER — Telehealth: Payer: Self-pay | Admitting: Internal Medicine

## 2015-04-28 LAB — LIPID PANEL
CHOL/HDL RATIO: 3.6 ratio
CHOLESTEROL: 100 mg/dL (ref 0–200)
HDL: 28 mg/dL — AB (ref >40)
LDL CALC: 48 mg/dL (ref 0–99)
Triglycerides: 121 mg/dL (ref <150)
VLDL: 24 mg/dL (ref 0–40)

## 2015-04-28 LAB — BASIC METABOLIC PANEL
Anion gap: 12 (ref 5–15)
BUN: 35 mg/dL — AB (ref 6–20)
CALCIUM: 8.8 mg/dL — AB (ref 8.9–10.3)
CHLORIDE: 93 mmol/L — AB (ref 101–111)
CO2: 29 mmol/L (ref 22–32)
Creatinine, Ser: 3.96 mg/dL — ABNORMAL HIGH (ref 0.61–1.24)
GFR calc non Af Amer: 13 mL/min — ABNORMAL LOW (ref >60)
GFR, EST AFRICAN AMERICAN: 16 mL/min — AB (ref >60)
GLUCOSE: 113 mg/dL — AB (ref 65–99)
Potassium: 3.3 mmol/L — ABNORMAL LOW (ref 3.5–5.1)
SODIUM: 134 mmol/L — AB (ref 135–145)

## 2015-04-28 LAB — GLUCOSE, CAPILLARY
GLUCOSE-CAPILLARY: 220 mg/dL — AB (ref 65–99)
GLUCOSE-CAPILLARY: 154 mg/dL — AB (ref 65–99)
Glucose-Capillary: 103 mg/dL — ABNORMAL HIGH (ref 65–99)
Glucose-Capillary: 111 mg/dL — ABNORMAL HIGH (ref 65–99)

## 2015-04-28 LAB — TROPONIN I
TROPONIN I: 0.03 ng/mL (ref <0.031)
Troponin I: 0.05 ng/mL — ABNORMAL HIGH (ref <0.031)

## 2015-04-28 LAB — TSH: TSH: 1.422 u[IU]/mL (ref 0.350–4.500)

## 2015-04-28 MED ORDER — AMLODIPINE BESYLATE 10 MG PO TABS
10.0000 mg | ORAL_TABLET | Freq: Every day | ORAL | Status: DC
  Administered 2015-04-28: 10 mg via ORAL
  Filled 2015-04-28 (×2): qty 1

## 2015-04-28 NOTE — Telephone Encounter (Signed)
Informed niece to speak with nurse taking care of patient in the hospital.  Advised to have her call to EP lab and find out if Dr. Lovena Le will be able to see patient before this evening.  Ask if patient will be receiving implant today.  She stated that nurse says she can't get anyone to call her back -- I explained that Lovena Le has been in procedures since 730 am and to call EP lab to get confirmation of consult time. Informed her that I don't think this will happen with Taylor's schedule today, but have nurse call and speak with Dr. Lovena Le about this. She is agreeable.

## 2015-04-28 NOTE — Consult Note (Signed)
Cardiologist:  Caryl Comes Reason for Consult: Bradycardia Referring Physician:   [name] is an 74 y.o. male.  HPI:   The patient is a 74 yo male with a history of CAD, CHB, CVA, DM, HTN, HLD, AS, ESRD, Anemia, PVD.  He underwent a right and left heart cath in Nov 2015 which revealed mild pulmonary HTN, totally occluded prox to mid RCA with extensive L to R collaterals 50 - 60% stenosis in the proximal LAD after the first diagonal branch, and 30% mid AV groove circumflex stenoses.  AO valve area 1.72cm^2.  He developed CHB during that admission and was on beta blocker at the time.   Echo in Nov 2015 revealed EF of 65-70% LV size normal, Severe AS, LA mild dilation, RV mild dilation.  Dr. Rayann Heman consulted during admission in Feb 2016.  At that time the patient wanted to avoid a PPM.  He presented yesterday from dialysis with CP and a HR in the 30's.  Initial EKG HR 42 with 2nd degree AVB and possible intermittent CHB.  Troponin 0.05 then >0.03 x two.  He reports the CP was severe 9/10 "squeezing" and associated with a "clammy" feeling, SOB, dizziness.  He also reports periodic dizziness prior to admission.  CP has resolved.    Past Medical History  Diagnosis Date  . Diabetes mellitus   . Hyperlipidemia   . Hypertension   . Chronic back pain   . End stage renal disease     a. on dialysis, LUE fistula  . Aortic stenosis     a. severe by echo 09/2014  . Anemia   . Atrial fibrillation     a. not well documented, not on anticoagulation  . Coronary artery disease     a. chronically occluded RCA per cath 09/2014 with collaterals  . Peripheral vascular disease   . Arthritis   . Claustrophobia   . Complete heart block     a. 11/205 resolved off AVN blocking agents b. recurred 12/2014  . CVA (cerebral infarction)     a.  no deficit     Past Surgical History  Procedure Laterality Date  . Cholecystectomy    . Esophagogastroduodenoscopy  08/01/2012    Procedure: ESOPHAGOGASTRODUODENOSCOPY  (EGD);  Surgeon: [name], MD;  Location: [facility];  Service: Endoscopy;  Laterality: N/A;  . Tonsillectomy    . Colonoscopy w/ biopsies and polypectomy    . Av fistula placement Left 10/19/2014    Procedure: BRACHIOCEPHALIC ARTERIOVENOUS (AV) FISTULA CREATION ;  Surgeon: Conrad New Home, MD;  Location: [place];  Service: Vascular;  Laterality: Left;  . Left and right heart catheterization with coronary angiogram N/A 09/30/2014    Procedure: LEFT AND RIGHT HEART CATHETERIZATION WITH CORONARY ANGIOGRAM;  Surgeon: [name], MD;  Location: [facility] CATH LAB;  Service: Cardiovascular;  Laterality: N/A;  . Insertion of dialysis catheter Right 02/02/2015    Procedure: INSERTION OF DIALYSIS CATHETER  RIGHT INTERNAL JUGULAR;  Surgeon: Mal [name], MD;  Location: [place];  Service: Vascular;  Laterality: Right;    Family History  Problem Relation Age of Onset  . Diabetes Father   . Heart disease Father   . Diabetes Sister     Social History:  reports that he quit smoking about 31 years ago. He has never used smokeless tobacco. He reports that he does not drink alcohol or use illicit drugs.  Allergies:  Allergies  Allergen Reactions  . Byetta 10 Mcg Pen [Exenatide]  Nausea And Vomiting    Medications: Scheduled Meds: . amLODipine  10 mg Oral Daily  . aspirin  324 mg Oral NOW   Or  . aspirin  300 mg Rectal NOW  . aspirin  81 mg Oral Daily  . heparin  5,000 Units Subcutaneous 3 times per day  . insulin aspart  0-15 Units Subcutaneous TID WC  . insulin detemir  35 Units Subcutaneous QHS  . multivitamin  1 tablet Oral Daily  . simvastatin  20 mg Oral QHS   Continuous Infusions: . sodium chloride 10 mL/hr at 04/28/15 0000   PRN Meds:.acetaminophen, nitroGLYCERIN, ondansetron (ZOFRAN) IV   Results for orders placed or performed during the hospital encounter of 04/27/15 (from the past 48 hour(s))  Troponin I     Status: Abnormal   Collection Time: 04/27/15  4:49 PM  Result Value Ref  Range   Troponin I 0.05 (H) <0.031 ng/mL    Comment:        PERSISTENTLY INCREASED TROPONIN VALUES IN THE RANGE OF 0.04-0.49 ng/mL CAN BE SEEN IN:       -UNSTABLE ANGINA       -CONGESTIVE HEART FAILURE       -MYOCARDITIS       -CHEST TRAUMA       -ARRYHTHMIAS       -LATE PRESENTING MYOCARDIAL INFARCTION       -COPD   CLINICAL FOLLOW-UP RECOMMENDED.   CBC with Differential/Platelet     Status: Abnormal   Collection Time: 04/27/15  4:49 PM  Result Value Ref Range   WBC 7.7 4.0 - 10.5 K/uL   RBC 3.85 (L) 4.22 - 5.81 MIL/uL   Hemoglobin 11.7 (L) 13.0 - 17.0 g/dL   HCT 33.6 (L) 39.0 - 52.0 %   MCV 87.3 78.0 - 100.0 fL   MCH 30.4 26.0 - 34.0 pg   MCHC 34.8 30.0 - 36.0 g/dL   RDW 13.5 11.5 - 15.5 %   Platelets 214 150 - 400 K/uL   Neutrophils Relative % 66 43 - 77 %   Neutro Abs 5.1 1.7 - 7.7 K/uL   Lymphocytes Relative 19 12 - 46 %   Lymphs Abs 1.4 0.7 - 4.0 K/uL   Monocytes Relative 12 3 - 12 %   Monocytes Absolute 1.0 0.1 - 1.0 K/uL   Eosinophils Relative 3 0 - 5 %   Eosinophils Absolute 0.2 0.0 - 0.7 K/uL   Basophils Relative 0 0 - 1 %   Basophils Absolute 0.0 0.0 - 0.1 K/uL  Basic metabolic panel     Status: Abnormal   Collection Time: 04/27/15  4:49 PM  Result Value Ref Range   Sodium 137 135 - 145 mmol/L   Potassium 3.2 (L) 3.5 - 5.1 mmol/L   Chloride 94 (L) 101 - 111 mmol/L   CO2 30 22 - 32 mmol/L   Glucose, Bld 104 (H) 65 - 99 mg/dL   BUN 23 (H) 6 - 20 mg/dL   Creatinine, Ser 3.03 (H) 0.61 - 1.24 mg/dL   Calcium 8.6 (L) 8.9 - 10.3 mg/dL   GFR calc non Af Amer 19 (L) >60 mL/min   GFR calc Af Amer 22 (L) >60 mL/min    Comment: (NOTE) The eGFR has been calculated using the CKD EPI equation. This calculation has not been validated in all clinical situations. eGFR's persistently <60 mL/min signify possible Chronic Kidney Disease.    Anion gap 13 5 - 15  MRSA PCR Screening  Status: None   Collection Time: 04/27/15  8:52 PM  Result Value Ref Range   MRSA by  PCR NEGATIVE NEGATIVE    Comment:        The GeneXpert MRSA Assay (FDA approved for NASAL specimens only), is one component of a comprehensive MRSA colonization surveillance program. It is not intended to diagnose MRSA infection nor to guide or monitor treatment for MRSA infections.   Glucose, capillary     Status: Abnormal   Collection Time: 04/27/15  9:41 PM  Result Value Ref Range   Glucose-Capillary 192 (H) 65 - 99 mg/dL   Comment 1 Capillary Specimen   Troponin I     Status: None   Collection Time: 04/27/15 10:45 PM  Result Value Ref Range   Troponin I <0.03 <0.031 ng/mL    Comment:        NO INDICATION OF MYOCARDIAL INJURY.   Protime-INR     Status: Abnormal   Collection Time: 04/27/15 10:45 PM  Result Value Ref Range   Prothrombin Time 15.3 (H) 11.6 - 15.2 seconds   INR 1.19 0.00 - 1.49  APTT     Status: None   Collection Time: 04/27/15 10:45 PM  Result Value Ref Range   aPTT 35 24 - 37 seconds  TSH     Status: None   Collection Time: 04/27/15 10:45 PM  Result Value Ref Range   TSH 1.422 0.350 - 4.500 uIU/mL  Troponin I     Status: None   Collection Time: 04/28/15 12:20 AM  Result Value Ref Range   Troponin I 0.03 <0.031 ng/mL    Comment:        NO INDICATION OF MYOCARDIAL INJURY.     Dg Chest Port 1 View  04/27/2015   CLINICAL DATA:  Initial encounter for 1 day history of chest pain  EXAM: PORTABLE CHEST - 1 VIEW  COMPARISON:  02/02/2015.  FINDINGS: 1645 hours. Lordotic positioning with low lung volumes. No focal consolidation or pulmonary edema. No pleural effusion. Vascular congestion noted without overt pulmonary edema. Cardiopericardial silhouette is at upper limits of normal for size. Telemetry leads overlie the chest.  IMPRESSION: Low volume film.  Cardiomegaly with vascular congestion.   Electronically Signed   By: [name] Stanley M.D.   On: 04/27/2015 16:51    Review of Systems  Constitutional: Positive for diaphoresis. Negative for fever.  HENT:  Negative for congestion and sore throat.   Respiratory: Positive for shortness of breath. Negative for cough.   Cardiovascular: Positive for chest pain. Negative for palpitations, orthopnea, leg swelling and PND.  Gastrointestinal: Negative for nausea, vomiting, abdominal pain, blood in stool and melena.  Neurological: Positive for dizziness.  All other systems reviewed and are negative.  Blood pressure 159/21, pulse 40, temperature 97.6 F (36.4 C), temperature source Oral, resp. rate 13, height 5' 11.5" (1.816 m), weight 216 lb 7.9 oz (98.2 kg), SpO2 92 %. Physical Exam  Nursing note and vitals reviewed. Constitutional: He appears well-developed and well-nourished. No distress.  HENT:  Head: Normocephalic and atraumatic.  Eyes: EOM are normal. Pupils are equal, round, and reactive to light. No scleral icterus.  Neck: Normal range of motion. Neck supple. No JVD present.  Cardiovascular: Normal rate, S1 normal and S2 normal.  An irregularly irregular rhythm present.  Murmur heard.  Systolic murmur is present with a grade of 2/6  Pulses:      Radial pulses are 1+ on the right side, and 1+ on the left  side.       Dorsalis pedis pulses are 2+ on the right side, and 2+ on the left side.  Respiratory: Effort normal and breath sounds normal. He has no wheezes. He has no rales.  GI: Soft. Bowel sounds are normal. He exhibits no distension. There is no tenderness.  Musculoskeletal: He exhibits no edema.  Lymphadenopathy:    He has no cervical adenopathy.  Neurological: He is alert. He exhibits normal muscle tone.  Skin: Skin is warm and dry.  Psychiatric: He has a normal mood and affect.    Assessment/Plan: Active Problems:   Complete heart block   Second deg AVB   DM   CADm, total RCA with collaterals   CP    AS, Severe.  Mean gradient 57mHg.   There still appears to be some association between the P and QRS at times on the EKG this morning as the QRS interval and P to P changes at  times.   He is not on AVN agents.  Given the severity of CP, I would have expected his troponin to be more elevated than 0.05; next two were <0.03.  He was hypotensive at the time with severe AS and bradycardia.  May have been demand related.  HR seems to respond to activity as it increased to the 70's when I sat him up.   I suspect a PPM may be required however.  MD opinion to follow.   HTarri Fuller PCayucos6/12/2014, 7:45 AM     EP Attending  Patient seen and examined. I have reviewed the findings as documented above by BTarri Fuller PAC. He has symptomatic bradycardia due to intermittant complete heart block which has occurred previously as well. He has a clear cut pacing indication. Real problem is risk of infectious complications after pacing in the setting of hemodialysis. In addition he has surgical AS. Only other real question is if he is not an aortic valve replacement candidate, and I suspect he is not, then if overall longevity would preclude pacing. Dr. SRenaldo Reelhas seen him previously and will followup tomorrow.  GMikle BosworthD

## 2015-04-28 NOTE — Telephone Encounter (Signed)
New message      Want Dr Caryl Comes to know pt is in the hosp waiting to see a cardiologist.  He has had nothing to eat all day thinking he is going to get a pacemaker.  Niece want Dr Caryl Comes to go see pt.

## 2015-04-28 NOTE — Care Management Note (Signed)
Case Management Note  Patient Details  Name: Jeffrey [name] MRN: [id number] Date of Birth: Oct 24, 1941  Subjective/Objective:      Adm w complete heart block              Action/Plan: lives w wife, pcp dr Rosana Hoes cloward   Expected Discharge Date:  04/30/15               Expected Discharge Plan:  [place]  In-House Referral:     Discharge planning Services     Post Acute Care Choice:    Choice offered to:     DME Arranged:    DME Agency:     HH Arranged:    [place] Agency:     Status of Service:     Medicare Important Message Given:    Date Medicare IM Given:    Medicare IM give by:    Date Additional Medicare IM Given:    Additional Medicare Important Message give by:     If discussed at [place] of Stay Meetings, dates discussed:    Additional Comments: ur review done  Lacretia Leigh, RN 04/28/2015, 10:48 AM

## 2015-04-29 DIAGNOSIS — I441 Atrioventricular block, second degree: Principal | ICD-10-CM

## 2015-04-29 LAB — RENAL FUNCTION PANEL
ALBUMIN: 3.2 g/dL — AB (ref 3.5–5.0)
Anion gap: 11 (ref 5–15)
BUN: 57 mg/dL — ABNORMAL HIGH (ref 6–20)
CALCIUM: 9.1 mg/dL (ref 8.9–10.3)
CO2: 25 mmol/L (ref 22–32)
Chloride: 99 mmol/L — ABNORMAL LOW (ref 101–111)
Creatinine, Ser: 5.03 mg/dL — ABNORMAL HIGH (ref 0.61–1.24)
GFR, EST AFRICAN AMERICAN: 12 mL/min — AB (ref >60)
GFR, EST NON AFRICAN AMERICAN: 10 mL/min — AB (ref >60)
Glucose, Bld: 206 mg/dL — ABNORMAL HIGH (ref 65–99)
PHOSPHORUS: 5.4 mg/dL — AB (ref 2.5–4.6)
POTASSIUM: 3.8 mmol/L (ref 3.5–5.1)
Sodium: 135 mmol/L (ref 135–145)

## 2015-04-29 LAB — GLUCOSE, CAPILLARY
Glucose-Capillary: 149 mg/dL — ABNORMAL HIGH (ref 65–99)
Glucose-Capillary: 170 mg/dL — ABNORMAL HIGH (ref 65–99)

## 2015-04-29 LAB — HEMOGLOBIN A1C
Hgb A1c MFr Bld: 6.7 % — ABNORMAL HIGH (ref 4.8–5.6)
MEAN PLASMA GLUCOSE: 146 mg/dL

## 2015-04-29 LAB — CBC
HCT: 28.6 % — ABNORMAL LOW (ref 39.0–52.0)
Hemoglobin: 10.3 g/dL — ABNORMAL LOW (ref 13.0–17.0)
MCH: 31.5 pg (ref 26.0–34.0)
MCHC: 36 g/dL (ref 30.0–36.0)
MCV: 87.5 fL (ref 78.0–100.0)
Platelets: 187 10*3/uL (ref 150–400)
RBC: 3.27 MIL/uL — AB (ref 4.22–5.81)
RDW: 13.6 % (ref 11.5–15.5)
WBC: 6.7 10*3/uL (ref 4.0–10.5)

## 2015-04-29 MED ORDER — SODIUM CHLORIDE 0.9 % IV SOLN: 100.0000 mL | INTRAVENOUS | Status: DC | PRN

## 2015-04-29 MED ORDER — NEPRO/CARBSTEADY PO LIQD
237.0000 mL | ORAL | Status: DC | PRN
  Filled 2015-04-29: qty 237

## 2015-04-29 MED ORDER — HEPARIN SODIUM (PORCINE) 1000 UNIT/ML DIALYSIS: 1000.0000 [IU] | INTRAMUSCULAR | Status: DC | PRN

## 2015-04-29 MED ORDER — ALTEPLASE 2 MG IJ SOLR
2.0000 mg | Freq: Once | INTRAMUSCULAR | Status: DC | PRN
  Filled 2015-04-29: qty 2

## 2015-04-29 MED ORDER — LIDOCAINE HCL (PF) 1 % IJ SOLN: 5.0000 mL | INTRAMUSCULAR | Status: DC | PRN

## 2015-04-29 MED ORDER — LIDOCAINE-PRILOCAINE 2.5-2.5 % EX CREA: 1.0000 "application " | TOPICAL_CREAM | CUTANEOUS | Status: DC | PRN

## 2015-04-29 MED ORDER — PENTAFLUOROPROP-TETRAFLUOROETH EX AERO: 1.0000 "application " | INHALATION_SPRAY | CUTANEOUS | Status: DC | PRN

## 2015-04-29 MED ORDER — HEPARIN SODIUM (PORCINE) 1000 UNIT/ML DIALYSIS
8000.0000 [IU] | Freq: Once | INTRAMUSCULAR | Status: DC
  Filled 2015-04-29: qty 8

## 2015-04-29 NOTE — Progress Notes (Signed)
Patient Name: Jeffrey [name]      SUBJECTIVE  wtihout sob or chest pain or LH this am  Past Medical History  Diagnosis Date  . Diabetes mellitus   . Hyperlipidemia   . Hypertension   . Chronic back pain   . End stage renal disease     a. on dialysis, LUE fistula  . Aortic stenosis     a. severe by echo 09/2014  . Anemia   . Atrial fibrillation     a. not well documented, not on anticoagulation  . Coronary artery disease     a. chronically occluded RCA per cath 09/2014 with collaterals  . Peripheral vascular disease   . Arthritis   . Claustrophobia   . Complete heart block     a. 11/205 resolved off AVN blocking agents b. recurred 12/2014  . CVA (cerebral infarction)     a.  no deficit     Scheduled Meds:  Scheduled Meds: . amLODipine  10 mg Oral Daily  . aspirin  81 mg Oral Daily  . heparin  5,000 Units Subcutaneous 3 times per day  . insulin aspart  0-15 Units Subcutaneous TID WC  . insulin detemir  35 Units Subcutaneous QHS  . multivitamin  1 tablet Oral Daily  . simvastatin  20 mg Oral QHS   Continuous Infusions: . sodium chloride Stopped (04/28/15 1400)   acetaminophen, nitroGLYCERIN, ondansetron (ZOFRAN) IV    PHYSICAL EXAM Filed Vitals:   04/29/15 0352 04/29/15 0400 04/29/15 0600 04/29/15 0800  BP:  141/23 150/37 177/32  Pulse:  41 34 58  Temp: 97.7 F (36.5 C)   98.1 F (36.7 C)  TempSrc: Oral   Oral  Resp:  14 15 17   Height:      Weight:      SpO2:  90% 94% 97%    General appearance: alert, cooperative and no distress Lungs: clear to auscultation bilaterally Heart: irregularly irregular rhythm, S1, S2 normal and systolic murmur: systolic ejection 3/6, crescendo and blowing at 2nd left intercostal space Abdomen: soft, non-tender; bowel sounds normal; no masses,  no organomegaly Extremities: edema none Skin: Skin color, texture, turgor normal. No rashes or lesions or  some erythema at the site of the previous transcuateneous  dialysis catheter Neurologic: Grossly normal  TELEMETRY: Reviewed telemetry pt in 2 AVB 1 mostly wth some higher grade block perhaps:    Intake/Output Summary (Last 24 hours) at 04/29/15 1014 Last data filed at 04/29/15 0354  Gross per 24 hour  Intake    760 ml  Output    600 ml  Net    160 ml    LABS: Basic Metabolic Panel:  Recent Labs Lab 04/27/15 1649 04/28/15 0703  NA 137 134*  K 3.2* 3.3*  CL 94* 93*  CO2 30 29  GLUCOSE 104* 113*  BUN 23* 35*  CREATININE 3.03* 3.96*  CALCIUM 8.6* 8.8*   Cardiac Enzymes:  Recent Labs  04/27/15 2245 04/28/15 0020 04/28/15 0703  TROPONINI <0.03 0.03 0.05*   CBC:  Recent Labs Lab 04/27/15 1649  WBC 7.7  NEUTROABS 5.1  HGB 11.7*  HCT 33.6*  MCV 87.3  PLT 214   PROTIME:  Recent Labs  04/27/15 2245  LABPROT 15.3*  INR 1.19   Liver Function Tests: No results for input(s): AST, ALT, ALKPHOS, BILITOT, PROT, ALBUMIN in the last 72 hours. No results for input(s): LIPASE, AMYLASE in the last 72 hours. BNP: BNP (last 3  results)  Recent Labs  01/11/15 1048  BNP 679.8*    ProBNP (last 3 results) No results for input(s): PROBNP in the last 8760 hours.  D-Dimer: No results for input(s): DDIMER in the last 72 hours. Hemoglobin A1C:  Recent Labs  04/27/15 2245  HGBA1C 6.7*   Fasting Lipid Panel:  Recent Labs  04/28/15 0703  CHOL 100  HDL 28*  LDLCALC 48  TRIG 121  CHOLHDL 3.6   Thyroid Function Tests:  Recent Labs  04/27/15 2245  TSH 1.422       ASSESSMENT AND PLAN:  Principal Problem:   Mobitz type 1 second degree AV block Active Problems:   Elevated troponin   ESRD (end stage renal disease) on dialysis   It strill remains unclear that there are attributable symptoms to his bradycardia and heart block,  The hypotension with HD may or may not have anything to do wth bradycardia.  We have reviewed this with his daughter and wife  Their inclination is to proceed but to defer, his is to  not proceed, and in the absence of clear symptoms, while there is a Class 1 indication for pacing, I have told him that while we can, it is not clear he will be better for it.  He is agreeable however that if this happens again, he will proceed at that point  For now will discharge after HD today  Signed, [name] MD  04/29/2015

## 2015-04-29 NOTE — Discharge Summary (Signed)
Physician Discharge Summary      Cardiologist:  Caryl Comes Patient ID: YEDIDYA MCCOMMON MRN: [id number] DOB/AGE: 1941-10-01 74 y.o.  Admit date: 04/27/2015 Discharge date: 04/29/2015  Admission Diagnoses:  Wenchbach   Discharge Diagnoses:  Principal Problem:   Mobitz type 1 second degree AV block Active Problems:   Elevated troponin   ESRD (end stage renal disease) on dialysis   Wenkebach   Bradycardia   DM   CAD  Discharged Condition: stable  Hospital Course:   74 year old man with known coronary artery disease. He had a cath showing an occluded RCA in November 2015 with left to right collaterals. He was at dialysis today. He had chest discomfort which felt like a band around his upper chest. He also had some hypotension during dialysis with systolics around 88. He had bradycardia into the high 30s.  He was hospitalized several months ago and had high degree heart block noted at that time. Pacemaker was discussed but he was managed medically since he was asymptomatic. At this time, he denies lightheadedness or passing out spells. He does occasionally have lightheadedness when he stands up. This lasts just a few seconds and then he can walk with a cane. He has not passed out.    He was admitted for observation.  Troponin was minimally elevated in the setting of ESRD(0.05). Home insulin and sliding scale given.  EP was consulted.  Dr. Caryl Comes feels it is not clear the the heart block attributed to his symptoms.  There is class one indication for pacemaker and should this happen again, he will implant one.  The patient was seen by Dr. Caryl Comes who felt he was stable for DC home after hemodialysis.   Consults: EP, Nephrology  Significant Diagnostic Studies:  EXAM: PORTABLE CHEST - 1 VIEW  COMPARISON: 02/02/2015.  FINDINGS: 1645 hours. Lordotic positioning with low lung volumes. No focal consolidation or pulmonary edema. No pleural effusion. Vascular congestion noted without overt  pulmonary edema. Cardiopericardial silhouette is at upper limits of normal for size. Telemetry leads overlie the chest.  IMPRESSION: Low volume film. Cardiomegaly with vascular congestion  Treatments: See above  Discharge Exam: Blood pressure 132/68, pulse 47, temperature 96.7 F (35.9 C), temperature source Oral, resp. rate 17, height 5' 11.5" (1.816 m), weight 222 lb 10.6 oz (101 kg), SpO2 96 %.   Disposition: 01-Home or Self Care      Discharge Instructions    Diet - low sodium heart healthy    Complete by:  As directed      Increase activity slowly    Complete by:  As directed             Medication List    TAKE these medications        amLODipine 10 MG tablet  Commonly known as:  NORVASC  Take 10 mg by mouth daily.     aspirin 81 MG chewable tablet  Chew 81 mg by mouth daily.     Insulin Detemir 100 UNIT/ML Pen  Commonly known as:  LEVEMIR  Inject 8 Units into the skin daily at 10 pm.     multivitamin Tabs tablet  Take 1 tablet by mouth daily.     NITROSTAT 0.4 MG SL tablet  Generic drug:  nitroGLYCERIN  Take 0.4 mg by mouth as needed.     simvastatin 20 MG tablet  Commonly known as:  ZOCOR  Take 1 tablet (20 mg total) by mouth at bedtime.       Follow-up  Information    Follow up with [name], MD On 07/14/2015.   Specialty:  Cardiology   Why:  8:30 AM   Contact information:   [id number] [place] [address] [phone]       Signed: Tarri Fuller 04/29/2015, 3:45 PM

## 2015-04-29 NOTE — Consult Note (Signed)
Renal Service Consult Note Jeffrey [name] 04/29/2015 Jeffrey [name] D Requesting Physician:  Dr Caryl Comes  Reason for Consult:  ESRD pt w bradycardia and hypotension on HD HPI: The patient is a 74 y.o. year-old with hx of aortic stenosis, DM, HTN, afib, CAD w RCA occlusion and ESRD on HD MWF. Has been having problems with late hypotension at HD. Had another episode yesterday with HR in the 30's and sent to ED after HD.  Admitted to hospital.  HR now is 50's and no plan for PPM at this time. Asked to see for HD.   Weight gains are 3-4 kg usually.  Started HD in last 6 months. No CP , SOB, n/v/d, no abd pain or confusion.  Past Medical History  Past Medical History  Diagnosis Date  . Diabetes mellitus   . Hyperlipidemia   . Hypertension   . Chronic back pain   . End stage renal disease     a. on dialysis, LUE fistula  . Aortic stenosis     a. severe by echo 09/2014  . Anemia   . Atrial fibrillation     a. not well documented, not on anticoagulation  . Coronary artery disease     a. chronically occluded RCA per cath 09/2014 with collaterals  . Peripheral vascular disease   . Arthritis   . Claustrophobia   . Complete heart block     a. 11/205 resolved off AVN blocking agents b. recurred 12/2014  . CVA (cerebral infarction)     a.  no deficit    Past Surgical History  Past Surgical History  Procedure Laterality Date  . Cholecystectomy    . Esophagogastroduodenoscopy  08/01/2012    Procedure: ESOPHAGOGASTRODUODENOSCOPY (EGD);  Surgeon: [name], MD;  Location: [facility];  Service: Endoscopy;  Laterality: N/A;  . Tonsillectomy    . Colonoscopy w/ biopsies and polypectomy    . Av fistula placement Left 10/19/2014    Procedure: BRACHIOCEPHALIC ARTERIOVENOUS (AV) FISTULA CREATION ;  Surgeon: Conrad Menlo, MD;  Location: [facility];  Service: Vascular;  Laterality: Left;  . Left and right heart catheterization with coronary angiogram N/A 09/30/2014     Procedure: LEFT AND RIGHT HEART CATHETERIZATION WITH CORONARY ANGIOGRAM;  Surgeon: [name], MD;  Location: [facility] CATH LAB;  Service: Cardiovascular;  Laterality: N/A;  . Insertion of dialysis catheter Right 02/02/2015    Procedure: INSERTION OF DIALYSIS CATHETER  RIGHT INTERNAL JUGULAR;  Surgeon: Mal [name], MD;  Location: [facility] OR;  Service: Vascular;  Laterality: Right;   Family History  Family History  Problem Relation Age of Onset  . Diabetes Father   . Heart disease Father   . Diabetes Sister    Social History  reports that he quit smoking about 31 years ago. He has never used smokeless tobacco. He reports that he does not drink alcohol or use illicit drugs. Allergies  Allergies  Allergen Reactions  . Byetta 10 Mcg Pen [Exenatide] Nausea And Vomiting   Home medications Prior to Admission medications   Medication Sig Start Date End Date Taking? Authorizing Provider  amLODipine (NORVASC) 10 MG tablet Take 10 mg by mouth daily.     Yes Historical Provider, MD  aspirin 81 MG chewable tablet Chew 81 mg by mouth daily.   Yes Historical Provider, MD  Insulin Detemir (LEVEMIR) 100 UNIT/ML Pen Inject 8 Units into the skin daily at 10 pm. Patient taking differently: Inject 35 Units into the skin  daily at 10 pm.  01/15/15  Yes Cherene Altes, MD  multivitamin (RENA-VIT) TABS tablet Take 1 tablet by mouth daily.   Yes Historical Provider, MD  nitroGLYCERIN (NITROSTAT) 0.4 MG SL tablet Take 0.4 mg by mouth as needed. 10/16/13  Yes Historical Provider, MD  simvastatin (ZOCOR) 20 MG tablet Take 1 tablet (20 mg total) by mouth at bedtime. 10/02/14  Yes Theodis Blaze, MD   Liver Function Tests No results for input(s): AST, ALT, ALKPHOS, BILITOT, PROT, ALBUMIN in the last 168 hours. No results for input(s): LIPASE, AMYLASE in the last 168 hours. CBC  Recent Labs Lab 04/27/15 1649  WBC 7.7  NEUTROABS 5.1  HGB 11.7*  HCT 33.6*  MCV 87.3  PLT [id number]   Basic Metabolic Panel  Recent  Labs Lab 04/27/15 1649 04/28/15 0703  NA 137 134*  K 3.2* 3.3*  CL 94* 93*  CO2 30 29  GLUCOSE 104* 113*  BUN 23* 35*  CREATININE 3.03* 3.96*  CALCIUM 8.6* 8.8*    Filed Vitals:   04/29/15 0400 04/29/15 0600 04/29/15 0800 04/29/15 1126  BP: 141/23 150/37 177/32 138/60  Pulse: 41 34 58 50  Temp:   98.1 F (36.7 C) 97.7 F (36.5 C)  TempSrc:   Oral Oral  Resp: 14 15 17 22   Height:      Weight:      SpO2: 90% 94% 97% 97%   Exam Alert, no distress No rash, cyanosis or gangrene Sclera anicteric, throat clear No jvd Chest clear bilat Irreg 2/6 sharp SEM, no S3 Abd soft ntnd no mass or ascites No LE edema Access + bruit L arm  HD: MWF NW 4h 97.5kg  2/2 bath  LUA AVF   Heparin 10000 Hect 4, Ven 50/wk, last Hb 11   Assessment: 1. Bradycardia - in pt with CAF and severe AS. No plans for PPM at this time, cannot say for sure per cards that hypotension is symptomatic given that is occurs on HD and there are other reasons to explain it.  2. Hypotension , resolved- prob related to fluid shift given high wt gains 3. ESRD on HD MWF 4. CAD hx RCA occlusion w collaterals 5. Vol up 1 kg only today   Plan- HD today next shift, ok for dc from renal standpoint  Kelly Splinter MD (pgr) [phone]    ([phone] 04/29/2015, 11:37 AM

## 2015-04-30 LAB — HEPATITIS B SURFACE ANTIGEN: Hepatitis B Surface Ag: NEGATIVE — AB

## 2015-04-30 NOTE — Progress Notes (Signed)
Pt VSS, post HD, discussed D/C summary with pt and family.  Pt D/C in w/c to car to go home with wife.    Carol Ada, RN

## 2015-06-13 ENCOUNTER — Telehealth: Payer: Self-pay | Admitting: Internal Medicine

## 2015-06-13 NOTE — Telephone Encounter (Signed)
New message     Pt c/o of Chest Pain: STAT if CP now or developed within 24 hours  1. Are you having CP right now? no  2. Are you experiencing any other symptoms (ex. SOB, nausea, vomiting, sweating)?  Low heart rate (low as the 40's), extreme fatigue  3. How long have you been experiencing CP? Over weekend 4. Is your CP continuous or coming and going? 5. Have you taken Nitroglycerin? yes Pt is having more episodes and is having to take nitro more often.  Pt is at dialysis today until 3pm?

## 2015-06-13 NOTE — Telephone Encounter (Signed)
Spoke with nurse, Angie, at dialysis.  Confirmed patient was taking NTG properly - pt reports only taking one secondary to CP. Nurse says that before Wenckebach incident in February his HR may drop in low 60s during tx.  States that since February hospital stay, HR drops into upper 40s/50s during tx but pt is not d/c from dialysis until HR is greater that 60. Pt reports more fatigue and irregular heart rhythm. Asked her to tell patient we will talk with him later today or tomorrow to see about sooner appt w/ Caryl Comes.

## 2015-06-16 ENCOUNTER — Encounter: Payer: Self-pay | Admitting: Physician Assistant

## 2015-06-16 ENCOUNTER — Ambulatory Visit (INDEPENDENT_AMBULATORY_CARE_PROVIDER_SITE_OTHER): Payer: Medicare Other | Admitting: Physician Assistant

## 2015-06-16 VITALS — BP 150/60 | HR 61 | Ht 71.0 in | Wt 219.2 lb

## 2015-06-16 DIAGNOSIS — I953 Hypotension of hemodialysis: Secondary | ICD-10-CM | POA: Diagnosis not present

## 2015-06-16 DIAGNOSIS — R001 Bradycardia, unspecified: Secondary | ICD-10-CM | POA: Diagnosis not present

## 2015-06-16 NOTE — Progress Notes (Signed)
Cardiology Office Note   Date:  06/16/2015   ID:  Jeffrey [name], DOB [birth year], MRN [id number]  PCP:  Thurman Coyer, MD  Cardiologist:  Dr Suzie Portela, PA-C   Chief complaint: Symptomatic bradycardia  History of Present Illness: Jeffrey [name] is a 74 y.o. male with a history of ESRD on HD, occluded RCA w/ collat, Wenkebach and bradycardia, off BB and rate-lowering CCB.   Jeffrey [name] presents for evaluation of recurrent symptomatic bradycardia during dialysis. Since his last discharge from the hospital, he was initially doing well. However, in the last week or 2, he has had more episodes of symptomatic bradycardia during dialysis. He will develop chest pain and some shortness of breath. At times it feels like there is a band around his chest. This is associated with his heart rate dropping into the 30s and [id number], and a systolic blood pressure in the 80s. Once his heart rate improves, his blood pressure will improve. When he left dialysis yesterday, his heart rate was in the 0000000 and his systolic blood pressure was 119. He states they're afraid to put him on dialysis and keep having to slow the takeoff rate in order to maintain his blood pressure.   Past Medical History  Diagnosis Date  . Diabetes mellitus   . Hyperlipidemia   . Hypertension   . Chronic back pain   . End stage renal disease     a. on dialysis, LUE fistula  . Aortic stenosis     a. severe by echo 09/2014  . Anemia   . Atrial fibrillation     a. not well documented, not on anticoagulation  . Coronary artery disease     a. chronically occluded RCA per cath 09/2014 with collaterals  . Peripheral vascular disease   . Arthritis   . Claustrophobia   . Complete heart block     a. 11/205 resolved off AVN blocking agents b. recurred 12/2014  . CVA (cerebral infarction)     a.  no deficit     Past Surgical History  Procedure Laterality Date  . Cholecystectomy    . Esophagogastroduodenoscopy   08/01/2012    Procedure: ESOPHAGOGASTRODUODENOSCOPY (EGD);  Surgeon: [name], MD;  Location: [facility];  Service: Endoscopy;  Laterality: N/A;  . Tonsillectomy    . Colonoscopy w/ biopsies and polypectomy    . Av fistula placement Left 10/19/2014    Procedure: BRACHIOCEPHALIC ARTERIOVENOUS (AV) FISTULA CREATION ;  Surgeon: Conrad [place], MD;  Location: [facility] [place];  Service: Vascular;  Laterality: Left;  . Left and right heart catheterization with coronary angiogram N/A 09/30/2014    Procedure: LEFT AND RIGHT HEART CATHETERIZATION WITH CORONARY ANGIOGRAM;  Surgeon: [name], MD;  Location: [facility] CATH LAB;  Service: Cardiovascular;  Laterality: N/A;  . Insertion of dialysis catheter Right 02/02/2015    Procedure: INSERTION OF DIALYSIS CATHETER  RIGHT INTERNAL JUGULAR;  Surgeon: Mal [name], MD;  Location: [facility];  Service: Vascular;  Laterality: Right;    Current Outpatient Prescriptions  Medication Sig Dispense Refill  . amLODipine (NORVASC) 10 MG tablet Take 10 mg by mouth daily.      [name] aspirin 81 MG chewable tablet Chew 81 mg by mouth daily.    . hydrALAZINE (APRESOLINE) 25 MG tablet Take 25 mg by mouth 2 (two) times daily.    . Insulin Detemir (LEVEMIR FLEXTOUCH) 100 UNIT/ML Pen Inject 35 Units into the skin at bedtime.    [name]  multivitamin (RENA-VIT) TABS tablet Take 1 tablet by mouth daily.    . nitroGLYCERIN (NITROSTAT) 0.4 MG SL tablet Take 0.4 mg by mouth every 5 (five) minutes as needed for chest pain.     . simvastatin (ZOCOR) 20 MG tablet Take 1 tablet (20 mg total) by mouth at bedtime. 30 tablet 1   No current facility-administered medications for this visit.    Allergies:   Byetta 10 mcg pen    Social History:  The patient  reports that he quit smoking about 31 years ago. He has never used smokeless tobacco. He reports that he does not drink alcohol or use illicit drugs.   Family History:  The patient's family history includes Diabetes in his father and sister; Heart disease  in his father.    ROS:  Please see the history of present illness. All other systems are reviewed and negative.    PHYSICAL EXAM: VS:  BP 150/60 mmHg  Pulse 61  Ht 5\' 11"  (1.803 m)  Wt 219 lb 3.2 oz (99.428 kg)  BMI 30.59 kg/m2  SpO2 96% , BMI Body mass index is 30.59 kg/(m^2). GEN: Well nourished, well developed, in no acute distress HEENT: normal for age Neck:  JVD 9 cm, bilateral carotid bruits due to radiation of murmur, or masses Cardiac: RRR; aortic stenosis murmur, no rubs, or gallops,no edema  Respiratory:  clear to auscultation bilaterally, normal work of breathing GI: soft, nontender, nondistended, + BS MS: no deformity or atrophy Skin: warm and dry, no rash Neuro:  Strength and sensation are intact Psych: euthymic mood, full affect   EKG:  EKG is not ordered today.  Recent Labs: 01/11/2015: ALT 8; B Natriuretic Peptide 679.8* 01/14/2015: Magnesium 2.3 04/27/2015: TSH 1.422 04/29/2015: BUN 57*; Creatinine, Ser 5.03*; Hemoglobin 10.3*; Platelets 187; Potassium 3.8; Sodium 135    Lipid Panel    Component Value Date/Time   CHOL 100 04/28/2015 0703   TRIG 121 04/28/2015 0703   HDL 28* 04/28/2015 0703   CHOLHDL 3.6 04/28/2015 0703   VLDL 24 04/28/2015 0703   LDLCALC 48 04/28/2015 0703     Wt Readings from Last 3 Encounters:  06/16/15 219 lb 3.2 oz (99.428 kg)  04/29/15 222 lb 10.6 oz (101 kg)  03/08/15 218 lb 3.2 oz (98.975 kg)     Other studies Reviewed: Additional studies/ records that were reviewed today include: Previous office notes, hospital records and echo  ASSESSMENT AND PLAN:  1.  Symptomatic bradycardia and Hypotension: We'll review this with Dr. Caryl Comes as he may be a candidate for pacemaker. For now, to minimize the hypotension that goes along with the bradycardia, will have him cut his amlodipine in half on dialysis days as he takes in the morning prior to dialysis. We'll have him take his hydralazine dose after dialysis.  Hopefully, this will help  keep him coming hypotensive along with the bradycardia until a final decision can be made about a device.  Current medicines are reviewed at length with the patient today.  The patient has concerns regarding medicines. These were dressed  The following changes have been made:  Decrease amlodipine to 5 mg daily on dialysis days. He takes hydralazine 25 mg twice a day, take the a.m. dose after dialysis on dialysis days.  Labs/ tests ordered today include: No orders of the defined types were placed in this encounter.     Disposition:  The office will call with follow-up information   Signed, Lenoard Aden  06/16/2015 4:55 PM  [facility] [place], [place], [place]  [address] Phone: [phone]; Fax: [phone]

## 2015-06-16 NOTE — Patient Instructions (Signed)
Medication Instructions:  Your physician has recommended you make the following change in your medication:   1- Decrease Amlodipine to have 1/2 tab on days you have dialysis   2- Take first dose of hydralazine after dialysis  Labwork: NONE  Testing/Procedures: NONE  Follow-Up: Will discuss possible pacemaker with Dr. Caryl Comes. The office will call you with an appointment.  Any Other Special Instructions Will Be Listed Below (If Applicable).

## 2015-06-16 NOTE — Telephone Encounter (Signed)
Pt seen by PA in office today

## 2015-06-28 ENCOUNTER — Telehealth: Payer: Self-pay | Admitting: *Deleted

## 2015-06-28 NOTE — Telephone Encounter (Signed)
Received call from operator that pt's dtr calling in to discuss plan of care w/ dad.  Before she could be transferred to discuss, she hung up. Called patient and informed him that I was unaware of plan of care but did review OV w/ Rosaria Ferries, NP and unsure if she has discussed with Dr. Caryl Comes yet.  Informed patient that I would follow up with this and discuss w/ Caryl Comes.  Informed him that I would call him back once reviewed with physician. Patient verbalized understanding and agreeable to plan.

## 2015-06-29 ENCOUNTER — Telehealth: Payer: Self-pay | Admitting: Internal Medicine

## 2015-06-29 NOTE — Telephone Encounter (Signed)
Dialysis nurse tells me patient is symptomatic bradycardic, dropping into 40s/50s. Patient is lethargic, SOB, has no energy, and fatigued. Nurse states that he looks bad today.  Patient is taking 1 nitro every other day (non-HD days) secondary to "chest pain". Informed nurse that I spoke with patient yesterday (who did not tell me of any complaints of fatigue, SOB, no energy) and informed him that I would be reviewing with Dr. Caryl Comes plan of care and letting him know - he verbalized understanding and agreeable. Explained to nurse that I would discuss with Caryl Comes and call her and pt with instructions.

## 2015-06-29 NOTE — Telephone Encounter (Signed)
New message   Need to confirm follow up plan for patient.  Patient at HD now   Patient took 1 nitro. Has taken every other day.    Pt C/O BP issue: STAT if pt C/O blurred vision, one-sided weakness or slurred speech  1. What are your last 5 BP readings? Vital signs 158/69 sitting / standing  131/67 / hr down  59 -continually dropping.  2. Are you having any other symptoms (ex. Dizziness, headache, blurred vision, passed out)?  Nurse think patient is symptomatic now   3. What is your BP issue? Discuss next plan of care .

## 2015-06-30 NOTE — Telephone Encounter (Signed)
Called patient. Scheduled him to see Caryl Comes on 8/11 (sooner appt) to address issues. Patient verbalized understanding and agreeable to plan.

## 2015-07-07 ENCOUNTER — Encounter: Payer: Self-pay | Admitting: Internal Medicine

## 2015-07-07 ENCOUNTER — Ambulatory Visit (INDEPENDENT_AMBULATORY_CARE_PROVIDER_SITE_OTHER): Payer: Medicare Other | Admitting: Internal Medicine

## 2015-07-07 ENCOUNTER — Other Ambulatory Visit: Payer: Self-pay

## 2015-07-07 VITALS — BP 136/62 | HR 74 | Ht 71.0 in | Wt 221.8 lb

## 2015-07-07 DIAGNOSIS — R001 Bradycardia, unspecified: Secondary | ICD-10-CM

## 2015-07-07 NOTE — Patient Instructions (Signed)
Medication Instructions:  Your physician recommends that you continue on your current medications as directed. Please refer to the Current Medication list given to you today.  Labwork: Handwritten prescription given to you today for:  BMET, CBCD  Testing/Procedures: Your physician has recommended that you have a pacemaker inserted. A pacemaker is a small device that is placed under the skin of your chest or abdomen to help control abnormal heart rhythms. This device uses electrical pulses to prompt the heart to beat at a normal rate. Pacemakers are used to treat heart rhythms that are too slow. Wire (leads) are attached to the pacemaker that goes into the chambers of you heart. This is done in the hospital and usually requires and overnight stay.  Devun Anna will call you with detail for this procedure  Follow-Up: Will call you to arrange wound check  Any Other Special Instructions Will Be Listed Below (If Applicable). Thank you for choosing [facility]!!       Pacemaker Implantation The heart has its own electrical system, or natural pacemaker, to regulate the heartbeat. Sometimes, the natural pacemaker system of the heart fails and causes the heart to beat too slowly. If this happens, a pacemaker can be surgically placed to help the heart beat at a normal or programmed rate. A pacemaker is a small, battery-powered device that is placed under the skin and is programmed to sense your heartbeats. If your heart rate is lower than the programmed rate, the pacemaker will pace your heart. Parts of a pacemaker include:  Wires or leads. The leads are placed in the heart and transmit electricity to the heart. The leads are connected to the pulse generator.  Pulse generator. The pulse generator contains a computer and a memory system. The pulse generator also produces the electrical signal that triggers the heart to beat. A pacemaker may be placed if:  You have a slow heartbeat  (bradycardia).  You have fainting (syncope).  Shortness of breath (dyspnea) due to heart problems. LET [facility] CARE PROVIDER KNOW ABOUT:  Any allergies you may have.  All medicines you are taking, including vitamins, herbs, eye drops, creams, and over-the-counter medicines.  Previous problems you or members of your family have had with the use of anesthetics.  Any blood disorders you have.  Previous surgeries you have had.  Medical conditions you have.  Possibility of pregnancy, if this applies. RISKS AND COMPLICATIONS Generally, pacemaker implantation is a safe procedure. However, problems can occur and include:  Bleeding.  Unable to place the pacemaker under local sedation.  Infection. BEFORE THE PROCEDURE  You will have blood work drawn before the procedure.  Do not use any tobacco products including cigarettes, chewing tobacco, or electronic cigarettes. If you need help quitting, ask your health care provider.  Do not eat or drink anything after midnight on the night before the procedure or as directed by your health care provider.  Ask your health care provider about:  Changing or stopping your regular medicines. This is especially important if you are taking diabetes medicines or blood thinners.  Taking medicines such as aspirin and ibuprofen. These medicines can thin your blood. Do not take these medicines before your procedure if your health care provider asks you not to.  Ask your health care provider if you can take a sip of water with any approved medicines the morning of the procedure. PROCEDURE  The surgery to place a pacemaker is considered a minimally invasive surgical procedure. It is done under  a local anesthetic, which is an injection at the incision site that makes the skin numb. You are also given sedation and pain medicine that makes you drowsy during the procedure.   An intravenous line (IV) will be started in your hand or arm so sedation and  pain medicine can be given during the pacemaker procedure.  A numbing medicine will be injected into the skin where the pacemaker is to be placed. A small incision will then be made into the skin. The pacemaker is usually placed under the skin near the collarbone.  After the incision has been made, the leads will be inserted into a large vein and guided into the heart using X-ray.  Using the same incision that was used to place the leads, a small pocket will be created under the skin to hold the pulse generator. The leads will then be connected to the pulse generator.  The incision site will then be closed. A bandage (dressing) is placed over the pacemaker site. The dressing is removed 24-48 hours afterward. AFTER THE PROCEDURE  You will be taken to a recovery area after the pacemaker implant. Your vital signs such as blood pressure, heart rate, breathing, and oxygen levels will be monitored.  A chest X-ray will be done after the pacemaker has been implanted. This is to make sure the pacemaker and leads are in the correct place. Document Released: 11/02/2002 Document Revised: 03/29/2014 Document Reviewed: 03/18/2012 [facility] Patient Information 2015 [place], [place]. This information is not intended to replace advice given to you by your health care provider. Make sure you discuss any questions you have with your health care provider.     Pacemaker Implantation, Care After Refer to this sheet over the next few weeks. These instructions provide you with information on caring for yourself after the procedure. Your health care provider may also give you more specific instructions. Your treatment has been planned according to current medical practices, but problems sometimes occur. Call your health care provider if you have any problems or questions regarding your pacemaker.  WHAT TO EXPECT AFTER THE PROCEDURE  You may feel pain. Some pain is normal. It may last a few days.  A slight bump may be  seen over the skin where the device was placed. Sometimes, it is possible to feel the device under the skin. This is normal.  In the months and years afterward, your health care provider will check the device, the leads, and the battery every few months. Eventually, when the battery is low, the device will be replaced. HOME CARE INSTRUCTIONS Medicines  Take medicines only as directed by your health care provider.  If you were prescribed an antibiotic medicine, finish it all even if you start to feel better.  Do not take any other medicines without asking your health care provider first. Some medicines, including certain painkillers, can cause bleeding in your stomach after surgery. Wound Care  Do not remove the bandage on your chest until directed to do so by your health care provider.  After your bandage is removed, you may see pieces of tape called skin adhesive strips over the area where the cut was made (incision site). Let them fall off on their own.  Check the incision site every day to make sure it is not infected, bleeding, or starting to pull apart.  Do not use lotions or ointments near the incision site unless directed to do so.  Keep the incision area clean and dry for 2-3 days after  the procedure or as directed by your health care provider. It takes several weeks for the incision site to completely heal.  Do not take baths, swim, or use a hot tub until your health care provider approves. Activities  Try to walk a little every day. Exercising is important after this procedure. It is also important to use your shoulder on the side of the pacemaker in daily tasks that do not require exaggerated motion.  Avoid sudden jerking, pulling, or chopping movements that pull your upper arm far away from your body for at least 6 weeks.  Do not lift your upper arm above your shoulders for at least 6 weeks. This means no tennis, golf, or swimming for this period of time. If you sleep with  the arm above your head, use a restraint to prevent this from happening as you sleep.  You may go back to work when your health care provider says it is okay. Check with your health care provider before you start to drive or play sports. Other Instructions  Follow diet instructions if they were provided. You should be able to eat what you usually do right away, but you may need to limit your salt intake.  Weigh yourself every day. If you suddenly gain weight, fluid may be building up in your body.  Always carry your pacemaker identification card with you. The card should list the implant date, device model, and manufacturer. Consider wearing a medical alert bracelet or necklace.  Tell all health care providers that you have a pacemaker. This may prevent them from giving you a magnetic resource imaging scan (MRI) because of the strong magnets used during that test.  If you must pass through a metal detector, quickly walk through it. Do not stop under the detector or stand near it.  Avoid places or objects with a strong electric or magnetic field, including:  Engineer, maintenance. When at the airport, let officials know you have a pacemaker. Your ID card will let you be checked in a way that is safe for you and that will not damage your pacemaker. Also, do not let a security person wave a magnetic wand near your pacemaker. That can make it stop working.  Power plants.  Large electrical generators.  Radiofrequency transmission towers, such as cell phone and radio towers.  Do not use amateur (ham) radio equipment or electric (arc) welding torches. Some devices are safe to use if held at least 1 foot from your pacemaker. These include power tools, lawn mowers, and speakers. If you are unsure of whether something is safe to use, ask your health care provider.  You may safely use electric blankets, heating pads, computers, and microwave ovens.  When using your cell phone, hold it to the ear  opposite the pacemaker. Do not leave your cell phone in a pocket over the pacemaker.  Keep all follow-up visits as directed by your health care provider. This is how your health care provider makes sure your chest is healing the way it should. Ask your health care provider when you should come back to have your stitches or staples taken out.  Have your pacemaker checked every 3-6 months or as directed by your health care provider. Most pacemakers last for 4-8 years before a new one is needed. SEEK MEDICAL CARE IF:  You gain weight suddenly.  Your legs or feet swell more than they have before.  It feels like your heart is fluttering or skipping beats (heart palpitations).  You have a fever. SEEK IMMEDIATE MEDICAL CARE IF:  You have chest pain.  You feel more short of breath than you have felt before.  You feel more light-headed than you have felt before.  You have problems with your incision site, such as swelling or bleeding, or it starts to open up.  You have drainage, redness, swelling, or pain at your incision site. Document Released: 06/01/2005 Document Revised: 03/29/2014 Document Reviewed: 03/14/2012 [facility] Patient Information 2015 [place], [place]. This information is not intended to replace advice given to you by your health care provider. Make sure you discuss any questions you have with your health care provider.

## 2015-07-07 NOTE — Progress Notes (Signed)
Patient Care Team: Thurman Coyer, MD as PCP - General (Internal Medicine) Fleet Contras, MD as Consulting Physician (Nephrology)   HPI  Jeffrey [name] is a 74 y.o. 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  He is admitted with complete heart block and his AV nodal blocking agents were discontinued. This was in November 2015  he was readmitted 2/16 symptoms of weakness and bradycardia.  This improved markedly with dialysis. More recently, he has had problems at dialysis with bradycardia associated with hypotension and chest pain. The latter resolves with improvement in the heart rate in the blood pressure follows along.  His aortic stenosis was reviewed and felt to be moderate. Initial numbers suggestive of severe aortic stenosis were felt to be exaggerated because of bradycardia.    Catheterization 11/15 demonstrated an occluded right coronary artery with left-right collaterals.  Past Medical History  Diagnosis Date  . Diabetes mellitus   . Hyperlipidemia   . Hypertension   . Chronic back pain   . End stage renal disease     a. on dialysis, LUE fistula  . Aortic stenosis     a. severe by echo 09/2014  . Anemia   . Atrial fibrillation     a. not well documented, not on anticoagulation  . Coronary artery disease     a. chronically occluded RCA per cath 09/2014 with collaterals  . Peripheral vascular disease   . Arthritis   . Claustrophobia   . Complete heart block     a. 11/205 resolved off AVN blocking agents b. recurred 12/2014  . CVA (cerebral infarction)     a.  no deficit     Past Surgical History  Procedure Laterality Date  . Cholecystectomy    . Esophagogastroduodenoscopy  08/01/2012    Procedure: ESOPHAGOGASTRODUODENOSCOPY (EGD);  Surgeon: [name], MD;  Location: [facility];  Service: Endoscopy;  Laterality: N/A;  . Tonsillectomy    .  Colonoscopy w/ biopsies and polypectomy    . Av fistula placement Left 10/19/2014    Procedure: BRACHIOCEPHALIC ARTERIOVENOUS (AV) FISTULA CREATION ;  Surgeon: Conrad Proberta, MD;  Location: [facility];  Service: Vascular;  Laterality: Left;  . Left and right heart catheterization with coronary angiogram N/A 09/30/2014    Procedure: LEFT AND RIGHT HEART CATHETERIZATION WITH CORONARY ANGIOGRAM;  Surgeon: [name], MD;  Location: [facility] CATH LAB;  Service: Cardiovascular;  Laterality: N/A;  . Insertion of dialysis catheter Right 02/02/2015    Procedure: INSERTION OF DIALYSIS CATHETER  RIGHT INTERNAL JUGULAR;  Surgeon: Mal [name], MD;  Location: [facility];  Service: Vascular;  Laterality: Right;    Current Outpatient Prescriptions  Medication Sig Dispense Refill  . amLODipine (NORVASC) 10 MG tablet Take 10 mg by mouth daily.      [name] aspirin 81 MG chewable tablet Chew 81 mg by mouth daily.    . calcium carbonate (TUMS - DOSED IN MG ELEMENTAL CALCIUM) 500 MG chewable tablet Chew 4 tablets by mouth 3 (three) times daily before meals.     . hydrALAZINE (APRESOLINE) 25 MG tablet Take 25 mg by mouth 2 (two) times daily.    . Insulin Detemir (LEVEMIR FLEXTOUCH) 100 UNIT/ML Pen Inject 35 Units into the skin at bedtime.    . multivitamin (RENA-VIT) TABS tablet Take 1 tablet by mouth daily.    . nitroGLYCERIN (NITROSTAT) 0.4  MG SL tablet Place 0.4 mg under the tongue every 5 (five) minutes as needed for chest pain.     . simvastatin (ZOCOR) 20 MG tablet Take 1 tablet (20 mg total) by mouth at bedtime. 30 tablet 1  . lidocaine-prilocaine (EMLA) cream Apply 1 application onto the left arm as directed     No current facility-administered medications for this visit.    Allergies  Allergen Reactions  . Byetta 10 Mcg Pen [Exenatide] Diarrhea and Nausea And Vomiting    Review of Systems negative except from HPI and PMH  Physical Exam BP 136/62 mmHg  Pulse 74  Ht 5\' 11"  (1.803 m)  Wt 221 lb 12.8 oz (100.608 kg)   BMI 30.95 kg/m2 Well developed and well nourished in no acute distress HENT normal E scleral and icterus clear Neck Supple JVP flat; carotids delayed Dialysis shunt in left arm Clear to ausculation Irregular and slow rate and rhythm,  3/6 harsh systolic murmur S2 single Soft with active bowel sounds No clubbing cyanosis Trace Edema Alert and oriented, grossly normal motor and sensory function Skin Warm and Dry  ECG was ordered today demonstrated sinus rhythm at 75 with 3-2 Mobitz heart block  Assessment and  Plan  Mobitz 1 second degree AV block   Complete heart block  Aortic stenosis moderate  Hypertension  End-stage renal disease  AS is stable and felt to be moderate The symptoms of chest pain hypotension in the context of bradycardia during dialysis is not clearly to me a primary arrhythmic event. He is a known occluded RCA with left-to-right collaterals. Another postulated mechanism would be inferior ischemia given by hypotension across his collaterals with subsequent Bezold-Jarisch  reflects an aggravation of his hypotension   I spoke with Dr. CD from nephrology. It is her impression that the bradycardia precedes the dialysis. When he has been dialyzed with bradycardia he tolerates an extremely poorly with chest pain shortness of breath and weakness. He has had asymptomatic bradycardia over months.  There seems to be a change in his ability to tolerate it. This may be partly related to dialysis.  The chest pain may also derive from his aortic stenosis as well as his collateralized right coronary artery. Maintaining blood pressure with  CLS pacemaker might be an advantage here.

## 2015-07-08 ENCOUNTER — Telehealth: Payer: Self-pay | Admitting: Internal Medicine

## 2015-07-08 NOTE — Telephone Encounter (Signed)
Reviewed PPM implant instructions for patient.    NPO after MN, wash chest and neck with antibacterial soap night before and morning of procedure, take 1/2 Levemir dose the night before procedure (Sunday), may take morning medications with small amount of water. He will have labs drawn at dialysis tomorrow and we will hopefully be able to scan into [facility] Monday morning. Procedure on Monday. Wound check scheduled for 8/25. Patient verbalized understanding and agreeable to plan.

## 2015-07-08 NOTE — Telephone Encounter (Signed)
New Message  Pt calling to speak w/ Sherri concerning Pacemaker surg on 8/15. Please call back and discuss.

## 2015-07-11 ENCOUNTER — Observation Stay (HOSPITAL_COMMUNITY)
Admission: RE | Admit: 2015-07-11 | Discharge: 2015-07-12 | Disposition: A | Discharge status: Home / Self Care | Payer: Medicare Other | Source: Ambulatory Visit | Attending: Internal Medicine | Admitting: Internal Medicine

## 2015-07-11 ENCOUNTER — Encounter (HOSPITAL_COMMUNITY)
Admission: RE | Disposition: A | Discharge status: Home / Self Care | Payer: Medicare Other | Source: Ambulatory Visit | Attending: Internal Medicine

## 2015-07-11 ENCOUNTER — Encounter (HOSPITAL_COMMUNITY): Payer: Self-pay | Admitting: General Practice

## 2015-07-11 DIAGNOSIS — Z7982 Long term (current) use of aspirin: Secondary | ICD-10-CM | POA: Insufficient documentation

## 2015-07-11 DIAGNOSIS — Z8673 Personal history of transient ischemic attack (TIA), and cerebral infarction without residual deficits: Secondary | ICD-10-CM | POA: Diagnosis not present

## 2015-07-11 DIAGNOSIS — I350 Nonrheumatic aortic (valve) stenosis: Secondary | ICD-10-CM | POA: Diagnosis not present

## 2015-07-11 DIAGNOSIS — N186 End stage renal disease: Secondary | ICD-10-CM | POA: Diagnosis not present

## 2015-07-11 DIAGNOSIS — R001 Bradycardia, unspecified: Secondary | ICD-10-CM | POA: Diagnosis not present

## 2015-07-11 DIAGNOSIS — I120 Hypertensive chronic kidney disease with stage 5 chronic kidney disease or end stage renal disease: Secondary | ICD-10-CM | POA: Diagnosis not present

## 2015-07-11 DIAGNOSIS — I441 Atrioventricular block, second degree: Principal | ICD-10-CM | POA: Insufficient documentation

## 2015-07-11 DIAGNOSIS — Z79899 Other long term (current) drug therapy: Secondary | ICD-10-CM | POA: Diagnosis not present

## 2015-07-11 DIAGNOSIS — E1151 Type 2 diabetes mellitus with diabetic peripheral angiopathy without gangrene: Secondary | ICD-10-CM | POA: Diagnosis not present

## 2015-07-11 DIAGNOSIS — I2510 Atherosclerotic heart disease of native coronary artery without angina pectoris: Secondary | ICD-10-CM | POA: Insufficient documentation

## 2015-07-11 DIAGNOSIS — Z794 Long term (current) use of insulin: Secondary | ICD-10-CM | POA: Insufficient documentation

## 2015-07-11 DIAGNOSIS — Z992 Dependence on renal dialysis: Secondary | ICD-10-CM | POA: Insufficient documentation

## 2015-07-11 DIAGNOSIS — I442 Atrioventricular block, complete: Secondary | ICD-10-CM | POA: Diagnosis not present

## 2015-07-11 DIAGNOSIS — E785 Hyperlipidemia, unspecified: Secondary | ICD-10-CM | POA: Diagnosis not present

## 2015-07-11 DIAGNOSIS — Z95818 Presence of other cardiac implants and grafts: Secondary | ICD-10-CM | POA: Insufficient documentation

## 2015-07-11 HISTORY — PX: INSERT / REPLACE / REMOVE PACEMAKER: SUR710

## 2015-07-11 HISTORY — DX: Dependence on renal dialysis: N18.6

## 2015-07-11 HISTORY — DX: Personal history of other medical treatment: Z92.89

## 2015-07-11 HISTORY — DX: Iron deficiency anemia, unspecified: D50.9

## 2015-07-11 HISTORY — DX: Angina pectoris, unspecified: I20.9

## 2015-07-11 HISTORY — DX: Type 2 diabetes mellitus without complications: E11.9

## 2015-07-11 HISTORY — DX: Personal history of other diseases of the digestive system: Z87.19

## 2015-07-11 HISTORY — DX: Dependence on renal dialysis: Z99.2

## 2015-07-11 HISTORY — DX: Cerebral infarction, unspecified: I63.9

## 2015-07-11 HISTORY — DX: Presence of cardiac pacemaker: Z95.0

## 2015-07-11 HISTORY — PX: EP IMPLANTABLE DEVICE: SHX172B

## 2015-07-11 HISTORY — DX: Personal history of peptic ulcer disease: Z87.11

## 2015-07-11 LAB — GLUCOSE, CAPILLARY
GLUCOSE-CAPILLARY: 96 mg/dL (ref 65–99)
GLUCOSE-CAPILLARY: 258 mg/dL — AB (ref 65–99)
Glucose-Capillary: 83 mg/dL (ref 65–99)

## 2015-07-11 LAB — BASIC METABOLIC PANEL
Anion gap: 13 (ref 5–15)
BUN: 36 mg/dL — AB (ref 6–20)
CO2: 28 mmol/L (ref 22–32)
CREATININE: 4.93 mg/dL — AB (ref 0.61–1.24)
Calcium: 8.9 mg/dL (ref 8.9–10.3)
Chloride: 96 mmol/L — ABNORMAL LOW (ref 101–111)
GFR calc Af Amer: 12 mL/min — ABNORMAL LOW (ref >60)
GFR, EST NON AFRICAN AMERICAN: 10 mL/min — AB (ref >60)
GLUCOSE: 96 mg/dL (ref 65–99)
POTASSIUM: 3.5 mmol/L (ref 3.5–5.1)
SODIUM: 137 mmol/L (ref 135–145)

## 2015-07-11 LAB — CBC
HCT: 26.5 % — ABNORMAL LOW (ref 39.0–52.0)
Hemoglobin: 9 g/dL — ABNORMAL LOW (ref 13.0–17.0)
MCH: 30.5 pg (ref 26.0–34.0)
MCHC: 34 g/dL (ref 30.0–36.0)
MCV: 89.8 fL (ref 78.0–100.0)
PLATELETS: 309 10*3/uL (ref 150–400)
RBC: 2.95 MIL/uL — ABNORMAL LOW (ref 4.22–5.81)
RDW: 13.6 % (ref 11.5–15.5)
WBC: 8.4 10*3/uL (ref 4.0–10.5)

## 2015-07-11 LAB — PROTIME-INR
INR: 1.44 (ref 0.00–1.49)
Prothrombin Time: 17.6 seconds — ABNORMAL HIGH (ref 11.6–15.2)

## 2015-07-11 LAB — SURGICAL PCR SCREEN
MRSA, PCR: NEGATIVE
Staphylococcus aureus: NEGATIVE

## 2015-07-11 SURGERY — PACEMAKER IMPLANT
Anesthesia: LOCAL

## 2015-07-11 MED ORDER — CEFAZOLIN SODIUM-DEXTROSE 2-3 GM-% IV SOLR
INTRAVENOUS | Status: AC
  Filled 2015-07-11: qty 50

## 2015-07-11 MED ORDER — NITROGLYCERIN 0.4 MG SL SUBL: 0.4000 mg | SUBLINGUAL_TABLET | SUBLINGUAL | Status: DC | PRN

## 2015-07-11 MED ORDER — FENTANYL CITRATE (PF) 100 MCG/2ML IJ SOLN
INTRAMUSCULAR | Status: AC
  Filled 2015-07-11: qty 4

## 2015-07-11 MED ORDER — CEFAZOLIN SODIUM 1-5 GM-% IV SOLN
1.0000 g | INTRAVENOUS | Status: AC
  Administered 2015-07-12: 10:00:00 1 g via INTRAVENOUS
  Filled 2015-07-11: qty 50

## 2015-07-11 MED ORDER — LIDOCAINE-PRILOCAINE 2.5-2.5 % EX CREA
TOPICAL_CREAM | Freq: Every day | CUTANEOUS | Status: DC | PRN
  Filled 2015-07-11: qty 5

## 2015-07-11 MED ORDER — CEFAZOLIN SODIUM-DEXTROSE 2-3 GM-% IV SOLR: 2.0000 g | INTRAVENOUS | Status: DC

## 2015-07-11 MED ORDER — MIDAZOLAM HCL 5 MG/5ML IJ SOLN
INTRAMUSCULAR | Status: AC
  Filled 2015-07-11: qty 25

## 2015-07-11 MED ORDER — LIDOCAINE HCL (PF) 1 % IJ SOLN
INTRAMUSCULAR | Status: AC
  Filled 2015-07-11: qty 30

## 2015-07-11 MED ORDER — CHLORHEXIDINE GLUCONATE 4 % EX LIQD
60.0000 mL | Freq: Once | CUTANEOUS | Status: DC
  Filled 2015-07-11: qty 60

## 2015-07-11 MED ORDER — CEFAZOLIN SODIUM-DEXTROSE 2-3 GM-% IV SOLR
INTRAVENOUS | Status: DC | PRN
  Administered 2015-07-11: 2 g via INTRAVENOUS

## 2015-07-11 MED ORDER — RENA-VITE PO TABS
1.0000 | ORAL_TABLET | Freq: Every day | ORAL | Status: DC
  Administered 2015-07-11: 19:00:00 via ORAL
  Filled 2015-07-11 (×2): qty 1

## 2015-07-11 MED ORDER — SODIUM CHLORIDE 0.9 % IV SOLN: INTRAVENOUS | Status: DC

## 2015-07-11 MED ORDER — ASPIRIN 81 MG PO CHEW
81.0000 mg | CHEWABLE_TABLET | Freq: Every day | ORAL | Status: DC
  Administered 2015-07-11 – 2015-07-12 (×2): 81 mg via ORAL
  Filled 2015-07-11 (×2): qty 1

## 2015-07-11 MED ORDER — LIDOCAINE HCL (PF) 1 % IJ SOLN
INTRAMUSCULAR | Status: DC | PRN
  Administered 2015-07-11: 40 mL via SUBCUTANEOUS

## 2015-07-11 MED ORDER — FENTANYL CITRATE (PF) 100 MCG/2ML IJ SOLN
INTRAMUSCULAR | Status: AC
  Filled 2015-07-11: qty 2

## 2015-07-11 MED ORDER — HEPARIN (PORCINE) IN NACL 2-0.9 UNIT/ML-% IJ SOLN
INTRAMUSCULAR | Status: DC | PRN
  Administered 2015-07-11: 14:00:00

## 2015-07-11 MED ORDER — GENTAMICIN SULFATE 40 MG/ML IJ SOLN: 80.0000 mg | INTRAMUSCULAR | Status: DC

## 2015-07-11 MED ORDER — IOHEXOL 350 MG/ML SOLN
INTRAVENOUS | Status: DC | PRN
  Administered 2015-07-11: 10 mL via INTRAVENOUS

## 2015-07-11 MED ORDER — FENTANYL CITRATE (PF) 100 MCG/2ML IJ SOLN
25.0000 ug | INTRAMUSCULAR | Status: DC | PRN
  Administered 2015-07-11: 25 ug via INTRAVENOUS

## 2015-07-11 MED ORDER — ACETAMINOPHEN 325 MG PO TABS
325.0000 mg | ORAL_TABLET | ORAL | Status: DC | PRN
  Administered 2015-07-11: 650 mg via ORAL
  Filled 2015-07-11: qty 2

## 2015-07-11 MED ORDER — AMLODIPINE BESYLATE 10 MG PO TABS
10.0000 mg | ORAL_TABLET | Freq: Every day | ORAL | Status: DC
  Administered 2015-07-11: 10 mg via ORAL
  Filled 2015-07-11 (×3): qty 1

## 2015-07-11 MED ORDER — GENTAMICIN SULFATE 40 MG/ML IJ SOLN
INTRAMUSCULAR | Status: DC | PRN
  Administered 2015-07-11: 15:00:00

## 2015-07-11 MED ORDER — HYDRALAZINE HCL 25 MG PO TABS
25.0000 mg | ORAL_TABLET | Freq: Two times a day (BID) | ORAL | Status: DC
  Administered 2015-07-11: 25 mg via ORAL
  Filled 2015-07-11 (×2): qty 1

## 2015-07-11 MED ORDER — MUPIROCIN 2 % EX OINT
TOPICAL_OINTMENT | CUTANEOUS | Status: AC
  Administered 2015-07-11: 1 via TOPICAL
  Filled 2015-07-11: qty 22

## 2015-07-11 MED ORDER — CEFAZOLIN SODIUM 1-5 GM-% IV SOLN
1.0000 g | Freq: Four times a day (QID) | INTRAVENOUS | Status: DC
  Filled 2015-07-11 (×3): qty 50

## 2015-07-11 MED ORDER — ONDANSETRON HCL 4 MG/2ML IJ SOLN
4.0000 mg | Freq: Four times a day (QID) | INTRAMUSCULAR | Status: DC | PRN
  Administered 2015-07-11: 4 mg via INTRAVENOUS

## 2015-07-11 MED ORDER — ONDANSETRON HCL 4 MG/2ML IJ SOLN
INTRAMUSCULAR | Status: AC
  Filled 2015-07-11: qty 2

## 2015-07-11 MED ORDER — MIDAZOLAM HCL 5 MG/5ML IJ SOLN
INTRAMUSCULAR | Status: DC | PRN
  Administered 2015-07-11: 1 mg via INTRAVENOUS

## 2015-07-11 MED ORDER — SIMVASTATIN 20 MG PO TABS
20.0000 mg | ORAL_TABLET | Freq: Every day | ORAL | Status: DC
  Administered 2015-07-11: 22:00:00 20 mg via ORAL
  Filled 2015-07-11: qty 1

## 2015-07-11 MED ORDER — CALCIUM CARBONATE ANTACID 500 MG PO CHEW
4.0000 | CHEWABLE_TABLET | Freq: Three times a day (TID) | ORAL | Status: DC
  Administered 2015-07-12: 08:00:00 800 mg via ORAL
  Filled 2015-07-11 (×4): qty 4

## 2015-07-11 MED ORDER — INSULIN DETEMIR 100 UNIT/ML FLEXPEN: 35.0000 [IU] | PEN_INJECTOR | Freq: Every day | SUBCUTANEOUS | Status: DC

## 2015-07-11 MED ORDER — INSULIN DETEMIR 100 UNIT/ML ~~LOC~~ SOLN
35.0000 [IU] | Freq: Every day | SUBCUTANEOUS | Status: DC
  Administered 2015-07-11: 35 [IU] via SUBCUTANEOUS
  Filled 2015-07-11 (×2): qty 0.35

## 2015-07-11 MED ORDER — SODIUM CHLORIDE 0.9 % IR SOLN
Status: AC
  Filled 2015-07-11: qty 2

## 2015-07-11 MED ORDER — YOU HAVE A PACEMAKER BOOK
Freq: Once | Status: AC
  Administered 2015-07-11: 20:00:00
  Filled 2015-07-11: qty 1

## 2015-07-11 MED ORDER — MUPIROCIN 2 % EX OINT
1.0000 "application " | TOPICAL_OINTMENT | Freq: Once | CUTANEOUS | Status: AC
  Administered 2015-07-11: 1 via TOPICAL
  Filled 2015-07-11: qty 22

## 2015-07-11 SURGICAL SUPPLY — 7 items
CABLE SURGICAL S-101-97-12 (CABLE) ×1 IMPLANT
LEAD CAPSURE NOVUS 45CM (Lead) ×1 IMPLANT
LEAD CAPSURE NOVUS 5076-52CM (Lead) ×1 IMPLANT
PAD DEFIB LIFELINK (PAD) ×1 IMPLANT
PPM ELUNA DR-T 394969 (Pacemaker) ×1 IMPLANT
SHEATH CLASSIC 7F (SHEATH) ×2 IMPLANT
TRAY PACEMAKER INSERTION (CUSTOM PROCEDURE TRAY) ×1 IMPLANT

## 2015-07-11 NOTE — Progress Notes (Signed)
CTSP for chest pain not pleuritic and reminsicent of pain in Dec  Echo bedside by Dr [facility] >>neg Speculated that may be related to demand ischemic with the faster heart rate assoc with restoration of AV synchrony.  Device reprogrammed VVI 60 and over next 10 min pain relieved and less sob Concurrently had received zofran and fentanyl  BP 120

## 2015-07-11 NOTE — H&P (Signed)
Patient Care Team: Thurman Coyer, MD as PCP - General (Internal Medicine) Fleet Contras, MD as Consulting Physician (Nephrology)   HPI  Jeffrey [name] is a 74 y.o. 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 He is admitted with complete heart block and his AV nodal blocking agents were discontinued. This was in November 2015 he was readmitted 2/16 symptoms of weakness and bradycardia.  This improved markedly with dialysis. More recently, he has had problems at dialysis with bradycardia associated with hypotension and chest pain. The latter resolves with improvement in the heart rate in the blood pressure follows along.  His aortic stenosis was reviewed and felt to be moderate. Initial numbers suggestive of severe aortic stenosis were felt to be exaggerated because of bradycardia.   Catheterization 11/15 demonstrated an occluded right coronary artery with left-right collaterals.  Past Medical History  Diagnosis Date  . Diabetes mellitus   . Hyperlipidemia   . Hypertension   . Chronic back pain   . End stage renal disease     a. on dialysis, LUE fistula  . Aortic stenosis     a. severe by echo 09/2014  . Anemia   . Atrial fibrillation     a. not well documented, not on anticoagulation  . Coronary artery disease     a. chronically occluded RCA per cath 09/2014 with collaterals  . Peripheral vascular disease   . Arthritis   . Claustrophobia   . Complete heart block     a. 11/205 resolved off AVN blocking agents b. recurred 12/2014  . CVA (cerebral infarction)     a. no deficit     Past Surgical History  Procedure Laterality Date  . Cholecystectomy    . Esophagogastroduodenoscopy  08/01/2012    Procedure: ESOPHAGOGASTRODUODENOSCOPY (EGD); Surgeon: [name], MD; Location: [facility]; Service: Endoscopy; Laterality: N/A;  . Tonsillectomy    . Colonoscopy w/ biopsies and polypectomy    . Av fistula placement Left 10/19/2014    Procedure: BRACHIOCEPHALIC ARTERIOVENOUS (AV) FISTULA CREATION ; Surgeon: Conrad Trenton, MD; Location: [place]; Service: Vascular; Laterality: Left;  . Left and right heart catheterization with coronary angiogram N/A 09/30/2014    Procedure: LEFT AND RIGHT HEART CATHETERIZATION WITH CORONARY ANGIOGRAM; Surgeon: [name], MD; Location: [facility] CATH LAB; Service: Cardiovascular; Laterality: N/A;  . Insertion of dialysis catheter Right 02/02/2015    Procedure: INSERTION OF DIALYSIS CATHETER RIGHT INTERNAL JUGULAR; Surgeon: Mal [name], MD; Location: [place]; Service: Vascular; Laterality: Right;    Current Outpatient Prescriptions  Medication Sig Dispense Refill  . amLODipine (NORVASC) 10 MG tablet Take 10 mg by mouth daily.     [name] aspirin 81 MG chewable tablet Chew 81 mg by mouth daily.    . calcium carbonate (TUMS - DOSED IN MG ELEMENTAL CALCIUM) 500 MG chewable tablet Chew 4 tablets by mouth 3 (three) times daily before meals.     . hydrALAZINE (APRESOLINE) 25 MG tablet Take 25 mg by mouth 2 (two) times daily.    . Insulin Detemir (LEVEMIR FLEXTOUCH) 100 UNIT/ML Pen Inject 35 Units into the skin at bedtime.    . multivitamin (RENA-VIT) TABS tablet Take 1 tablet by mouth daily.    . nitroGLYCERIN (NITROSTAT) 0.4 MG SL tablet Place 0.4 mg under the tongue every 5 (five) minutes as needed for chest pain.     . simvastatin (ZOCOR) 20  MG tablet Take 1 tablet (20 mg total) by mouth at bedtime. 30 tablet 1  . lidocaine-prilocaine (EMLA) cream Apply 1 application onto the left arm as directed     No current facility-administered medications for this visit.    Allergies  Allergen Reactions  . Byetta 10 Mcg Pen [Exenatide] Diarrhea and Nausea  And Vomiting    Review of Systems negative except from HPI and PMH  Physical Exam BP 136/62 mmHg  Pulse 74  Ht 5\' 11"  (1.803 m)  Wt 221 lb 12.8 oz (100.608 kg)  BMI 30.95 kg/m2 Well developed and well nourished in no acute distress HENT normal E scleral and icterus clear Neck Supple JVP flat; carotids delayed Dialysis shunt in left arm Clear to ausculation Irregular and slow rate and rhythm,  3/6 harsh systolic murmur S2 single, irregular Soft with active bowel sounds No clubbing cyanosis Trace Edema Alert and oriented, grossly normal motor and sensory function Skin Warm and Dry  ECG was ordered today demonstrated sinus rhythm at 75 with 3-2 Mobitz heart block  Assessment and Plan  Mobitz 1 second degree AV block   Complete heart block  Aortic stenosis moderate  Hypertension  End-stage renal disease  AS is stable and felt to be moderate The symptoms of chest pain hypotension in the context of bradycardia during dialysis is not clearly to me a primary arrhythmic event. He is a known occluded RCA with left-to-right collaterals. Another postulated mechanism would be inferior ischemia given by hypotension across his collaterals with subsequent Bezold-Jarisch reflects an aggravation of his hypotension   I spoke with Dr. CD from nephrology. It is her impression that the bradycardia precedes the dialysis. When he has been dialyzed with bradycardia he tolerates an extremely poorly with chest pain shortness of breath and weakness. He has had asymptomatic bradycardia over months. There seems to be a change in his ability to tolerate it. This may be partly related to dialysis.  The chest pain may also derive from his aortic stenosis as well as his collateralized right coronary artery. Maintaining blood pressure with CLS pacemaker might be an advantage here.   Procedure explained with risks and benefits including but not limited to bleeding, infection, pneumothorax and  tamponade.  The patient understands and agrees to proceed.  Jeffrey Loflin MD 07/11/2015 12:17 PM

## 2015-07-12 ENCOUNTER — Encounter (HOSPITAL_COMMUNITY): Payer: Self-pay | Admitting: Cardiology

## 2015-07-12 ENCOUNTER — Observation Stay (HOSPITAL_COMMUNITY): Payer: Medicare Other

## 2015-07-12 DIAGNOSIS — Z959 Presence of cardiac and vascular implant and graft, unspecified: Secondary | ICD-10-CM | POA: Diagnosis not present

## 2015-07-12 DIAGNOSIS — Z95818 Presence of other cardiac implants and grafts: Secondary | ICD-10-CM | POA: Insufficient documentation

## 2015-07-12 DIAGNOSIS — N186 End stage renal disease: Secondary | ICD-10-CM | POA: Diagnosis not present

## 2015-07-12 DIAGNOSIS — I441 Atrioventricular block, second degree: Secondary | ICD-10-CM | POA: Diagnosis not present

## 2015-07-12 DIAGNOSIS — I442 Atrioventricular block, complete: Secondary | ICD-10-CM | POA: Diagnosis not present

## 2015-07-12 DIAGNOSIS — I120 Hypertensive chronic kidney disease with stage 5 chronic kidney disease or end stage renal disease: Secondary | ICD-10-CM | POA: Diagnosis not present

## 2015-07-12 LAB — GLUCOSE, CAPILLARY: Glucose-Capillary: 135 mg/dL — ABNORMAL HIGH (ref 65–99)

## 2015-07-12 MED FILL — Lidocaine HCl Local Preservative Free (PF) Inj 1%: INTRAMUSCULAR | Qty: 60 | Status: AC

## 2015-07-12 NOTE — Discharge Instructions (Signed)
° ° °  Supplemental Discharge Instructions for  Pacemaker/Defibrillator Patients  Activity No heavy lifting or vigorous activity with your left/right arm for 6 to 8 weeks.  Do not raise your left/right arm above your head for one week.  Gradually raise your affected arm as drawn below.           __         07/16/15                  07/17/15                  07/18/15                      07/19/15  NO DRIVING for  1 week   ; you may begin driving on  [id number]   .  WOUND CARE - Keep the wound area clean and dry.  Do not get this area wet for one week. No showers for one week; you may shower on  07/19/15   . - The tape/steri-strips on your wound will fall off; do not pull them off.  No bandage is needed on the site.  DO  NOT apply any creams, oils, or ointments to the wound area. - If you notice any drainage or discharge from the wound, any swelling or bruising at the site, or you develop a fever > 101? F after you are discharged home, call the office at once.  Special Instructions - You are still able to use cellular telephones; use the ear opposite the side where you have your pacemaker/defibrillator.  Avoid carrying your cellular phone near your device. - When traveling through airports, show security personnel your identification card to avoid being screened in the metal detectors.  Ask the security personnel to use the hand wand. - Avoid arc welding equipment, MRI testing (magnetic resonance imaging), TENS units (transcutaneous nerve stimulators).  Call the office for questions about other devices. - Avoid electrical appliances that are in poor condition or are not properly grounded. - Microwave ovens are safe to be near or to operate.

## 2015-07-12 NOTE — Discharge Summary (Signed)
ELECTROPHYSIOLOGY PROCEDURE DISCHARGE SUMMARY    Patient ID: Jeffrey [name],  MRN: [id number], DOB/AGE: [birth year] 74 y.o.  Admit date: 07/11/2015 Discharge date: 07/12/2015  Primary Care Physician: Thurman Coyer, MD Electrophysiologist: [facility]  Primary Discharge Diagnosis:  Symptomatic bradycardia status post pacemaker implantation this admission  Secondary Discharge Diagnosis:  1.  ESRD on HD 2.  Moderate aortic stenosis 3.  Diabetes 4.  Hypertension 5.  Hyperlipidemia 6.  CAD with chronically occluded RCA 7.  PVD 8.  Prior CVA 9.  ?AF - not well documented  Allergies  Allergen Reactions  . Byetta 10 Mcg Pen [Exenatide] Diarrhea and Nausea And Vomiting     Procedures This Admission:  1.  Implantation of a Biotronik dual chamber PPM on 07/11/15 by Dr Curt Bears.  The patient received a BTK model number [id number] PPM with model number 5076 right atrial lead and 5076 right ventricular lead. There were no immediate post procedure complications. 2.  CXR on 07/12/15 demonstrated no pneumothorax status post device implantation.   Brief HPI: Jeffrey [name] is a 74 y.o. male with a past medical history as outlined above.  He has had documented heart block in the setting of moderate AS and ESRD on HD.  The patient has had symptomatic bradycardia without reversible causes identified.  Risks, benefits, and alternatives to PPM implantation were reviewed with the patient who wished to proceed.   Hospital Course:  The patient was admitted and underwent implantation of a Biotronik dual chamber pacemaker with details as outlined above.  He  was monitored on telemetry overnight which demonstrated sinus rhythm with 1st degree heart block.  He had some chest pain post procedure that was felt to be related to demand ischemia with restoration of AV synchrony and faster rates.  His device was programmed VVI 60 with resolution of pain.  Beside echo demonstrated no effusion.  Left chest was without  hematoma or ecchymosis.  The device was interrogated and found to be functioning normally.  CXR was obtained and demonstrated no pneumothorax status post device implantation.  Wound care, arm mobility, and restrictions were reviewed with the patient.  The patient was examined and considered stable for discharge to home.    Physical Exam: Filed Vitals:   07/11/15 2000 07/12/15 0251 07/12/15 0320 07/12/15 0754  BP: 123/67 156/56  170/82  Pulse: 75 66  67  Temp: 98.8 F (37.1 C) 98.2 F (36.8 C)  97.4 F (36.3 C)  TempSrc: Axillary Oral  Oral  Resp: 26 17  19   Height:      Weight:   219 lb 2.2 oz (99.4 kg)   SpO2: 94% 94%  91%    Labs:   Lab Results  Component Value Date   WBC 8.4 07/11/2015   HGB 9.0* 07/11/2015   HCT 26.5* 07/11/2015   MCV 89.8 07/11/2015   PLT 309 07/11/2015     Recent Labs Lab 07/11/15 1102  NA 137  K 3.5  CL 96*  CO2 28  BUN 36*  CREATININE 4.93*  CALCIUM 8.9  GLUCOSE 96    Discharge Medications:    Medication List    TAKE these medications        amLODipine 10 MG tablet  Commonly known as:  NORVASC  Take 10 mg by mouth daily.     aspirin 81 MG chewable tablet  Chew 81 mg by mouth daily.     calcium carbonate 500 MG chewable tablet  Commonly known as:  TUMS -  dosed in mg elemental calcium  Chew 4 tablets by mouth 3 (three) times daily before meals.     hydrALAZINE 25 MG tablet  Commonly known as:  APRESOLINE  Take 25 mg by mouth 2 (two) times daily.     LEVEMIR FLEXTOUCH 100 UNIT/ML Pen  Generic drug:  Insulin Detemir  Inject 35 Units into the skin at bedtime.     lidocaine-prilocaine cream  Commonly known as:  EMLA  Apply 1 application onto the left arm as directed     multivitamin Tabs tablet  Take 1 tablet by mouth daily.     NITROSTAT 0.4 MG SL tablet  Generic drug:  nitroGLYCERIN  Place 0.4 mg under the tongue every 5 (five) minutes as needed for chest pain.     simvastatin 20 MG tablet  Commonly known as:  ZOCOR    Take 1 tablet (20 mg total) by mouth at bedtime.        Disposition:  Discharge Instructions    Diet - low sodium heart healthy    Complete by:  As directed      Increase activity slowly    Complete by:  As directed           Follow-up Information    Follow up with [facility] On 07/21/2015.   Why:  at [facility] for wound check   Contact information:   [address]  [phone]       Follow up with [name], MD On 10/11/2015.   Specialty:  Cardiology   Why:  at 3:15PM   Contact information:   [address] [address] [phone]       Duration of Discharge Encounter: Greater than 30 minutes including physician time.  Signed, Chanetta Marshall, NP 07/12/2015 9:06 AM   I have seen and examined this patient with Chanetta Marshall.  Agree with above, note added to reflect my findings.  On exam, pacemaker site clean dry and intact.    Yanilen Adamik M. Marzetta Lanza MD 07/12/2015 12:40 PM

## 2015-07-14 ENCOUNTER — Ambulatory Visit: Payer: Medicare Other | Admitting: Internal Medicine

## 2015-07-18 ENCOUNTER — Encounter (HOSPITAL_COMMUNITY): Payer: Self-pay | Admitting: *Deleted

## 2015-07-18 ENCOUNTER — Inpatient Hospital Stay (HOSPITAL_BASED_OUTPATIENT_CLINIC_OR_DEPARTMENT_OTHER): Payer: Medicare Other

## 2015-07-18 ENCOUNTER — Inpatient Hospital Stay (HOSPITAL_COMMUNITY)
Admission: EM | Admit: 2015-07-18 | Discharge: 2015-08-05 | DRG: 266 | Disposition: A | Discharge status: Home / Self Care | Payer: Medicare Other | Attending: Cardiovascular Disease | Admitting: Cardiovascular Disease

## 2015-07-18 ENCOUNTER — Emergency Department (HOSPITAL_COMMUNITY): Payer: Medicare Other

## 2015-07-18 DIAGNOSIS — R0902 Hypoxemia: Secondary | ICD-10-CM

## 2015-07-18 DIAGNOSIS — Z87891 Personal history of nicotine dependence: Secondary | ICD-10-CM | POA: Diagnosis not present

## 2015-07-18 DIAGNOSIS — I272 Other secondary pulmonary hypertension: Secondary | ICD-10-CM | POA: Diagnosis present

## 2015-07-18 DIAGNOSIS — J90 Pleural effusion, not elsewhere classified: Secondary | ICD-10-CM | POA: Diagnosis present

## 2015-07-18 DIAGNOSIS — I309 Acute pericarditis, unspecified: Secondary | ICD-10-CM | POA: Diagnosis not present

## 2015-07-18 DIAGNOSIS — E118 Type 2 diabetes mellitus with unspecified complications: Secondary | ICD-10-CM

## 2015-07-18 DIAGNOSIS — T148 Other injury of unspecified body region: Secondary | ICD-10-CM | POA: Diagnosis present

## 2015-07-18 DIAGNOSIS — R110 Nausea: Secondary | ICD-10-CM

## 2015-07-18 DIAGNOSIS — G8929 Other chronic pain: Secondary | ICD-10-CM | POA: Diagnosis present

## 2015-07-18 DIAGNOSIS — I351 Nonrheumatic aortic (valve) insufficiency: Secondary | ICD-10-CM | POA: Diagnosis present

## 2015-07-18 DIAGNOSIS — E8779 Other fluid overload: Secondary | ICD-10-CM | POA: Diagnosis not present

## 2015-07-18 DIAGNOSIS — Z7902 Long term (current) use of antithrombotics/antiplatelets: Secondary | ICD-10-CM

## 2015-07-18 DIAGNOSIS — Z992 Dependence on renal dialysis: Secondary | ICD-10-CM

## 2015-07-18 DIAGNOSIS — K761 Chronic passive congestion of liver: Secondary | ICD-10-CM | POA: Diagnosis not present

## 2015-07-18 DIAGNOSIS — J9811 Atelectasis: Secondary | ICD-10-CM | POA: Diagnosis not present

## 2015-07-18 DIAGNOSIS — D631 Anemia in chronic kidney disease: Secondary | ICD-10-CM | POA: Diagnosis present

## 2015-07-18 DIAGNOSIS — Z9842 Cataract extraction status, left eye: Secondary | ICD-10-CM | POA: Diagnosis not present

## 2015-07-18 DIAGNOSIS — Z79899 Other long term (current) drug therapy: Secondary | ICD-10-CM

## 2015-07-18 DIAGNOSIS — Z794 Long term (current) use of insulin: Secondary | ICD-10-CM | POA: Diagnosis not present

## 2015-07-18 DIAGNOSIS — K219 Gastro-esophageal reflux disease without esophagitis: Secondary | ICD-10-CM | POA: Diagnosis present

## 2015-07-18 DIAGNOSIS — R109 Unspecified abdominal pain: Secondary | ICD-10-CM

## 2015-07-18 DIAGNOSIS — E1122 Type 2 diabetes mellitus with diabetic chronic kidney disease: Secondary | ICD-10-CM | POA: Diagnosis present

## 2015-07-18 DIAGNOSIS — I4510 Unspecified right bundle-branch block: Secondary | ICD-10-CM | POA: Diagnosis present

## 2015-07-18 DIAGNOSIS — Z961 Presence of intraocular lens: Secondary | ICD-10-CM | POA: Diagnosis present

## 2015-07-18 DIAGNOSIS — D62 Acute posthemorrhagic anemia: Secondary | ICD-10-CM | POA: Diagnosis not present

## 2015-07-18 DIAGNOSIS — E1165 Type 2 diabetes mellitus with hyperglycemia: Secondary | ICD-10-CM | POA: Diagnosis not present

## 2015-07-18 DIAGNOSIS — J811 Chronic pulmonary edema: Secondary | ICD-10-CM | POA: Diagnosis present

## 2015-07-18 DIAGNOSIS — I340 Nonrheumatic mitral (valve) insufficiency: Secondary | ICD-10-CM | POA: Diagnosis not present

## 2015-07-18 DIAGNOSIS — E1151 Type 2 diabetes mellitus with diabetic peripheral angiopathy without gangrene: Secondary | ICD-10-CM | POA: Diagnosis present

## 2015-07-18 DIAGNOSIS — I2510 Atherosclerotic heart disease of native coronary artery without angina pectoris: Secondary | ICD-10-CM | POA: Diagnosis present

## 2015-07-18 DIAGNOSIS — N186 End stage renal disease: Secondary | ICD-10-CM | POA: Diagnosis not present

## 2015-07-18 DIAGNOSIS — E875 Hyperkalemia: Secondary | ICD-10-CM | POA: Diagnosis not present

## 2015-07-18 DIAGNOSIS — I350 Nonrheumatic aortic (valve) stenosis: Secondary | ICD-10-CM

## 2015-07-18 DIAGNOSIS — E8770 Fluid overload, unspecified: Secondary | ICD-10-CM | POA: Diagnosis present

## 2015-07-18 DIAGNOSIS — E785 Hyperlipidemia, unspecified: Secondary | ICD-10-CM | POA: Diagnosis present

## 2015-07-18 DIAGNOSIS — Z833 Family history of diabetes mellitus: Secondary | ICD-10-CM | POA: Diagnosis not present

## 2015-07-18 DIAGNOSIS — Z006 Encounter for examination for normal comparison and control in clinical research program: Secondary | ICD-10-CM | POA: Diagnosis not present

## 2015-07-18 DIAGNOSIS — J9621 Acute and chronic respiratory failure with hypoxia: Secondary | ICD-10-CM | POA: Diagnosis present

## 2015-07-18 DIAGNOSIS — Z7982 Long term (current) use of aspirin: Secondary | ICD-10-CM | POA: Diagnosis not present

## 2015-07-18 DIAGNOSIS — K7200 Acute and subacute hepatic failure without coma: Secondary | ICD-10-CM | POA: Diagnosis present

## 2015-07-18 DIAGNOSIS — Z8673 Personal history of transient ischemic attack (TIA), and cerebral infarction without residual deficits: Secondary | ICD-10-CM | POA: Diagnosis not present

## 2015-07-18 DIAGNOSIS — I252 Old myocardial infarction: Secondary | ICD-10-CM | POA: Diagnosis not present

## 2015-07-18 DIAGNOSIS — Q211 Atrial septal defect: Secondary | ICD-10-CM

## 2015-07-18 DIAGNOSIS — I2584 Coronary atherosclerosis due to calcified coronary lesion: Secondary | ICD-10-CM | POA: Diagnosis present

## 2015-07-18 DIAGNOSIS — D689 Coagulation defect, unspecified: Secondary | ICD-10-CM | POA: Diagnosis present

## 2015-07-18 DIAGNOSIS — I2582 Chronic total occlusion of coronary artery: Secondary | ICD-10-CM | POA: Diagnosis present

## 2015-07-18 DIAGNOSIS — N2581 Secondary hyperparathyroidism of renal origin: Secondary | ICD-10-CM | POA: Diagnosis present

## 2015-07-18 DIAGNOSIS — Z950 Presence of cardiac pacemaker: Secondary | ICD-10-CM | POA: Diagnosis not present

## 2015-07-18 DIAGNOSIS — Z952 Presence of prosthetic heart valve: Secondary | ICD-10-CM

## 2015-07-18 DIAGNOSIS — I25110 Atherosclerotic heart disease of native coronary artery with unstable angina pectoris: Secondary | ICD-10-CM | POA: Diagnosis not present

## 2015-07-18 DIAGNOSIS — J189 Pneumonia, unspecified organism: Secondary | ICD-10-CM

## 2015-07-18 DIAGNOSIS — I5042 Chronic combined systolic (congestive) and diastolic (congestive) heart failure: Secondary | ICD-10-CM

## 2015-07-18 DIAGNOSIS — E11649 Type 2 diabetes mellitus with hypoglycemia without coma: Secondary | ICD-10-CM

## 2015-07-18 DIAGNOSIS — I5043 Acute on chronic combined systolic (congestive) and diastolic (congestive) heart failure: Secondary | ICD-10-CM | POA: Diagnosis present

## 2015-07-18 DIAGNOSIS — R55 Syncope and collapse: Secondary | ICD-10-CM

## 2015-07-18 DIAGNOSIS — J9601 Acute respiratory failure with hypoxia: Secondary | ICD-10-CM | POA: Diagnosis present

## 2015-07-18 DIAGNOSIS — Z9841 Cataract extraction status, right eye: Secondary | ICD-10-CM

## 2015-07-18 DIAGNOSIS — I25119 Atherosclerotic heart disease of native coronary artery with unspecified angina pectoris: Secondary | ICD-10-CM | POA: Diagnosis not present

## 2015-07-18 DIAGNOSIS — Z888 Allergy status to other drugs, medicaments and biological substances status: Secondary | ICD-10-CM

## 2015-07-18 DIAGNOSIS — R7989 Other specified abnormal findings of blood chemistry: Secondary | ICD-10-CM | POA: Diagnosis not present

## 2015-07-18 DIAGNOSIS — Z8249 Family history of ischemic heart disease and other diseases of the circulatory system: Secondary | ICD-10-CM | POA: Diagnosis not present

## 2015-07-18 DIAGNOSIS — I480 Paroxysmal atrial fibrillation: Secondary | ICD-10-CM | POA: Diagnosis present

## 2015-07-18 DIAGNOSIS — I5023 Acute on chronic systolic (congestive) heart failure: Secondary | ICD-10-CM

## 2015-07-18 DIAGNOSIS — R0789 Other chest pain: Secondary | ICD-10-CM | POA: Diagnosis not present

## 2015-07-18 DIAGNOSIS — I120 Hypertensive chronic kidney disease with stage 5 chronic kidney disease or end stage renal disease: Secondary | ICD-10-CM | POA: Diagnosis present

## 2015-07-18 DIAGNOSIS — R740 Nonspecific elevation of levels of transaminase and lactic acid dehydrogenase [LDH]: Secondary | ICD-10-CM | POA: Diagnosis not present

## 2015-07-18 DIAGNOSIS — Z954 Presence of other heart-valve replacement: Secondary | ICD-10-CM | POA: Diagnosis not present

## 2015-07-18 DIAGNOSIS — R079 Chest pain, unspecified: Secondary | ICD-10-CM | POA: Diagnosis present

## 2015-07-18 DIAGNOSIS — I5033 Acute on chronic diastolic (congestive) heart failure: Secondary | ICD-10-CM | POA: Diagnosis present

## 2015-07-18 DIAGNOSIS — R072 Precordial pain: Secondary | ICD-10-CM | POA: Diagnosis not present

## 2015-07-18 DIAGNOSIS — I509 Heart failure, unspecified: Secondary | ICD-10-CM

## 2015-07-18 HISTORY — DX: Presence of prosthetic heart valve: Z95.2

## 2015-07-18 HISTORY — DX: Heart failure, unspecified: I50.9

## 2015-07-18 LAB — TROPONIN I
TROPONIN I: 0.72 ng/mL — AB (ref <0.031)
TROPONIN I: 0.73 ng/mL — AB (ref <0.031)
TROPONIN I: 0.82 ng/mL — AB (ref <0.031)
TROPONIN I: 0.64 ng/mL — AB (ref <0.031)

## 2015-07-18 LAB — CBC
HCT: 23.4 % — ABNORMAL LOW (ref 39.0–52.0)
HEMATOCRIT: 27 % — AB (ref 39.0–52.0)
HEMOGLOBIN: 8.9 g/dL — AB (ref 13.0–17.0)
HEMOGLOBIN: 7.9 g/dL — AB (ref 13.0–17.0)
MCH: 30.1 pg (ref 26.0–34.0)
MCH: 31.3 pg (ref 26.0–34.0)
MCHC: 33 g/dL (ref 30.0–36.0)
MCHC: 33.8 g/dL (ref 30.0–36.0)
MCV: 91.2 fL (ref 78.0–100.0)
MCV: 92.9 fL (ref 78.0–100.0)
Platelets: 370 10*3/uL (ref 150–400)
Platelets: 302 10*3/uL (ref 150–400)
RBC: 2.96 MIL/uL — AB (ref 4.22–5.81)
RBC: 2.52 MIL/uL — AB (ref 4.22–5.81)
RDW: 14.3 % (ref 11.5–15.5)
RDW: 14.4 % (ref 11.5–15.5)
WBC: 11.1 10*3/uL — AB (ref 4.0–10.5)
WBC: 9.6 10*3/uL (ref 4.0–10.5)

## 2015-07-18 LAB — BASIC METABOLIC PANEL
ANION GAP: 15 (ref 5–15)
BUN: 44 mg/dL — AB (ref 6–20)
CHLORIDE: 94 mmol/L — AB (ref 101–111)
CO2: 28 mmol/L (ref 22–32)
Calcium: 9.7 mg/dL (ref 8.9–10.3)
Creatinine, Ser: 5.63 mg/dL — ABNORMAL HIGH (ref 0.61–1.24)
GFR, EST AFRICAN AMERICAN: 10 mL/min — AB (ref >60)
GFR, EST NON AFRICAN AMERICAN: 9 mL/min — AB (ref >60)
Glucose, Bld: 124 mg/dL — ABNORMAL HIGH (ref 65–99)
POTASSIUM: 3.8 mmol/L (ref 3.5–5.1)
SODIUM: 137 mmol/L (ref 135–145)

## 2015-07-18 LAB — GLUCOSE, CAPILLARY
GLUCOSE-CAPILLARY: 212 mg/dL — AB (ref 65–99)
Glucose-Capillary: 95 mg/dL (ref 65–99)
Glucose-Capillary: 165 mg/dL — ABNORMAL HIGH (ref 65–99)

## 2015-07-18 LAB — I-STAT TROPONIN, ED: Troponin i, poc: 0.25 ng/mL (ref 0.00–0.08)

## 2015-07-18 LAB — HEPATITIS B SURFACE ANTIGEN: HEP B S AG: NEGATIVE

## 2015-07-18 LAB — BRAIN NATRIURETIC PEPTIDE: B NATRIURETIC PEPTIDE 5: 2856 pg/mL — AB (ref 0.0–100.0)

## 2015-07-18 MED ORDER — SODIUM CHLORIDE 0.9 % IV SOLN: 250.0000 mL | INTRAVENOUS | Status: DC | PRN

## 2015-07-18 MED ORDER — ONDANSETRON HCL 4 MG/2ML IJ SOLN
4.0000 mg | Freq: Four times a day (QID) | INTRAMUSCULAR | Status: DC | PRN
  Administered 2015-07-25 – 2015-07-31 (×6): 4 mg via INTRAVENOUS
  Filled 2015-07-18 (×6): qty 2

## 2015-07-18 MED ORDER — ACETAMINOPHEN 325 MG PO TABS
650.0000 mg | ORAL_TABLET | Freq: Four times a day (QID) | ORAL | Status: DC | PRN
  Administered 2015-08-02: 650 mg via ORAL
  Filled 2015-07-18: qty 2

## 2015-07-18 MED ORDER — ALBUTEROL SULFATE (2.5 MG/3ML) 0.083% IN NEBU
2.5000 mg | INHALATION_SOLUTION | RESPIRATORY_TRACT | Status: DC | PRN
  Administered 2015-07-18 – 2015-07-25 (×4): 2.5 mg via RESPIRATORY_TRACT
  Filled 2015-07-18 (×4): qty 3

## 2015-07-18 MED ORDER — HYDROMORPHONE HCL 1 MG/ML IJ SOLN
0.5000 mg | INTRAMUSCULAR | Status: DC | PRN
  Administered 2015-07-26: 1 mg via INTRAVENOUS
  Filled 2015-07-18: qty 1

## 2015-07-18 MED ORDER — ASPIRIN 325 MG PO TABS
325.0000 mg | ORAL_TABLET | Freq: Every day | ORAL | Status: DC
  Administered 2015-07-18 – 2015-08-03 (×15): 325 mg via ORAL
  Filled 2015-07-18 (×17): qty 1

## 2015-07-18 MED ORDER — HYDROCODONE-HOMATROPINE 5-1.5 MG/5ML PO SYRP: 5.0000 mL | ORAL_SOLUTION | ORAL | Status: DC | PRN

## 2015-07-18 MED ORDER — FUROSEMIDE 10 MG/ML IJ SOLN
80.0000 mg | Freq: Once | INTRAMUSCULAR | Status: AC
  Administered 2015-07-18: 80 mg via INTRAVENOUS
  Filled 2015-07-18: qty 8

## 2015-07-18 MED ORDER — INSULIN ASPART 100 UNIT/ML ~~LOC~~ SOLN
0.0000 [IU] | Freq: Three times a day (TID) | SUBCUTANEOUS | Status: DC
  Administered 2015-07-18 – 2015-07-19 (×3): 2 [IU] via SUBCUTANEOUS
  Administered 2015-07-20: 1 [IU] via SUBCUTANEOUS
  Administered 2015-07-20: 2 [IU] via SUBCUTANEOUS
  Administered 2015-07-21: 1 [IU] via SUBCUTANEOUS
  Administered 2015-07-21: 2 [IU] via SUBCUTANEOUS
  Administered 2015-07-23 (×3): 1 [IU] via SUBCUTANEOUS
  Administered 2015-07-24 (×2): 2 [IU] via SUBCUTANEOUS
  Administered 2015-07-24: 3 [IU] via SUBCUTANEOUS
  Administered 2015-07-25 (×3): 2 [IU] via SUBCUTANEOUS
  Administered 2015-07-26: 1 [IU] via SUBCUTANEOUS
  Administered 2015-07-26 – 2015-07-30 (×10): 2 [IU] via SUBCUTANEOUS
  Administered 2015-07-30 – 2015-07-31 (×3): 1 [IU] via SUBCUTANEOUS
  Administered 2015-07-31 – 2015-08-01 (×3): 2 [IU] via SUBCUTANEOUS
  Administered 2015-08-03: 1 [IU] via SUBCUTANEOUS
  Administered 2015-08-03: 3 [IU] via SUBCUTANEOUS
  Administered 2015-08-04 (×4): 1 [IU] via SUBCUTANEOUS

## 2015-07-18 MED ORDER — NITROGLYCERIN 0.4 MG SL SUBL: 0.4000 mg | SUBLINGUAL_TABLET | SUBLINGUAL | Status: DC | PRN

## 2015-07-18 MED ORDER — DARBEPOETIN ALFA 100 MCG/0.5ML IJ SOSY
PREFILLED_SYRINGE | INTRAMUSCULAR | Status: AC
  Filled 2015-07-18: qty 0.5

## 2015-07-18 MED ORDER — METOPROLOL TARTRATE 12.5 MG HALF TABLET
12.5000 mg | ORAL_TABLET | Freq: Two times a day (BID) | ORAL | Status: DC
  Administered 2015-07-18 – 2015-08-05 (×24): 12.5 mg via ORAL
  Filled 2015-07-18 (×38): qty 1

## 2015-07-18 MED ORDER — HEPARIN SODIUM (PORCINE) 5000 UNIT/ML IJ SOLN
5000.0000 [IU] | Freq: Three times a day (TID) | INTRAMUSCULAR | Status: DC
  Administered 2015-07-18 – 2015-08-05 (×47): 5000 [IU] via SUBCUTANEOUS
  Filled 2015-07-18 (×55): qty 1

## 2015-07-18 MED ORDER — ONDANSETRON HCL 4 MG PO TABS: 4.0000 mg | ORAL_TABLET | Freq: Four times a day (QID) | ORAL | Status: DC | PRN

## 2015-07-18 MED ORDER — CALCIUM CARBONATE ANTACID 500 MG PO CHEW
4.0000 | CHEWABLE_TABLET | Freq: Three times a day (TID) | ORAL | Status: DC
  Administered 2015-07-18 – 2015-07-21 (×6): 800 mg via ORAL
  Filled 2015-07-18 (×13): qty 4

## 2015-07-18 MED ORDER — DARBEPOETIN ALFA 100 MCG/0.5ML IJ SOSY
100.0000 ug | PREFILLED_SYRINGE | INTRAMUSCULAR | Status: DC
  Administered 2015-07-18 – 2015-08-01 (×2): 100 ug via INTRAVENOUS
  Filled 2015-07-18 (×3): qty 0.5

## 2015-07-18 MED ORDER — CETYLPYRIDINIUM CHLORIDE 0.05 % MT LIQD
7.0000 mL | Freq: Two times a day (BID) | OROMUCOSAL | Status: DC
  Administered 2015-07-19 – 2015-08-04 (×25): 7 mL via OROMUCOSAL

## 2015-07-18 MED ORDER — ACETAMINOPHEN 650 MG RE SUPP: 650.0000 mg | Freq: Four times a day (QID) | RECTAL | Status: DC | PRN

## 2015-07-18 MED ORDER — SODIUM CHLORIDE 0.9 % IJ SOLN
3.0000 mL | INTRAMUSCULAR | Status: DC | PRN
  Administered 2015-07-21: 3 mL via INTRAVENOUS
  Filled 2015-07-18: qty 3

## 2015-07-18 MED ORDER — NITROGLYCERIN 2 % TD OINT
1.0000 [in_us] | TOPICAL_OINTMENT | Freq: Four times a day (QID) | TRANSDERMAL | Status: DC
  Administered 2015-07-18 – 2015-07-22 (×15): 1 [in_us] via TOPICAL
  Filled 2015-07-18 (×24): qty 30
  Filled 2015-07-18: qty 1
  Filled 2015-07-18 (×6): qty 30

## 2015-07-18 MED ORDER — RENA-VITE PO TABS
1.0000 | ORAL_TABLET | Freq: Every day | ORAL | Status: DC
  Administered 2015-07-18 – 2015-08-04 (×17): 1 via ORAL
  Filled 2015-07-18 (×22): qty 1

## 2015-07-18 MED ORDER — CLOPIDOGREL BISULFATE 75 MG PO TABS
75.0000 mg | ORAL_TABLET | Freq: Every day | ORAL | Status: DC
  Administered 2015-07-18 – 2015-07-28 (×10): 75 mg via ORAL
  Filled 2015-07-18 (×11): qty 1

## 2015-07-18 MED ORDER — SODIUM CHLORIDE 0.9 % IJ SOLN
3.0000 mL | Freq: Two times a day (BID) | INTRAMUSCULAR | Status: DC
  Administered 2015-07-18 – 2015-07-25 (×14): 3 mL via INTRAVENOUS

## 2015-07-18 NOTE — ED Notes (Signed)
Lab at the bedside 

## 2015-07-18 NOTE — ED Notes (Signed)
Pt states that he had a Pacemaker placed on Monday (Medtronic). States that yesterday he began experiencing CP and SOB.

## 2015-07-18 NOTE — Progress Notes (Signed)
  Echocardiogram 2D Echocardiogram has been performed.  Jeffrey [name] 07/18/2015, 3:34 PM

## 2015-07-18 NOTE — Care Management Note (Signed)
Case Management Note  Patient Details  Name: Jeffrey [name] MRN: [id number] Date of Birth: 14-Jul-1941  Subjective/Objective:       Adm r resp failure             Action/Plan: lives w fam, pcp dr Rosana Hoes coward   Expected Discharge Date:                  Expected Discharge Plan:  Home/Self Care  In-House Referral:     Discharge planning Services     Post Acute Care Choice:    Choice offered to:     DME Arranged:    DME Agency:     HH Arranged:    [place] Agency:     Status of Service:     Medicare Important Message Given:    Date Medicare IM Given:    Medicare IM give by:    Date Additional Medicare IM Given:    Additional Medicare Important Message give by:     If discussed at [place] of Stay Meetings, dates discussed:    Additional Comments: ur review done  Lacretia Leigh, RN 07/18/2015, 1:34 PM

## 2015-07-18 NOTE — ED Notes (Signed)
Pt is a MWF dialysis pt.

## 2015-07-18 NOTE — ED Notes (Signed)
MD Arnoldo Morale made aware patient removed from BiPap due to intolerance and placed on 3L [place].

## 2015-07-18 NOTE — Consult Note (Signed)
Jeffrey [name] is an 74 y.o. male referred by Dr Arnoldo Morale   Chief Complaint: ESRD, Pulm Edema, Secd HPTH, anemia HPI:  74yo WM with ESRD comes to ER CO SOB and chest tightness.  Had episode Sat early AM that lasted about 2 hours and then resolved.  Sxs returned last night so he presented to ER.  He just had a pacemaker placed last week for bradycardia.  He was cathed last year and has total occlusion of RCA with collaterals.  He also has severe AS.  Last HD was Friday and he says he got to his DW.  Denies excessive fluid intake over weekend  Past Medical History  Diagnosis Date  . Hyperlipidemia   . Hypertension   . Aortic stenosis     a. severe by echo 09/2014  . Atrial fibrillation     a. not well documented, not on anticoagulation  . Coronary artery disease     a. chronically occluded RCA per cath 09/2014 with collaterals  . Peripheral vascular disease   . Claustrophobia   . Complete heart block     a. 11/205 resolved off AVN blocking agents b. recurred 12/2014  . Presence of permanent cardiac pacemaker   . Anginal pain   . Myocardial infarction 10/2014  . Pneumonia   . Type II diabetes mellitus   . Iron deficiency anemia   . History of blood transfusion     "related to gallbladder OR"  . History of stomach ulcers   . CVA (cerebral vascular accident) 10/2014    denies residual on 07/11/2015  . ESRD (end stage renal disease) on dialysis     a. on dialysis; [facility]; MWF, LUE fistula (07/11/2015)    Past Surgical History  Procedure Laterality Date  . Esophagogastroduodenoscopy  08/01/2012    Procedure: ESOPHAGOGASTRODUODENOSCOPY (EGD);  Surgeon: [name], MD;  Location: [facility];  Service: Endoscopy;  Laterality: N/A;  . Tonsillectomy    . Colonoscopy w/ biopsies and polypectomy    . Av fistula placement Left 10/19/2014    Procedure: BRACHIOCEPHALIC ARTERIOVENOUS (AV) FISTULA CREATION ;  Surgeon: Conrad Cerritos, MD;  Location: [facility];  Service: Vascular;  Laterality:  Left;  . Left and right heart catheterization with coronary angiogram N/A 09/30/2014    Procedure: LEFT AND RIGHT HEART CATHETERIZATION WITH CORONARY ANGIOGRAM;  Surgeon: [name], MD;  Location: [facility] CATH LAB;  Service: Cardiovascular;  Laterality: N/A;  . Insertion of dialysis catheter Right 02/02/2015    Procedure: INSERTION OF DIALYSIS CATHETER  RIGHT INTERNAL JUGULAR;  Surgeon: Mal [name], MD;  Location: [facility];  Service: Vascular;  Laterality: Right;  . Cardiac catheterization    . Insert / replace / remove pacemaker  07/11/2015  . Cholecystectomy open  1980's  . Cataract extraction w/ intraocular lens  implant, bilateral Bilateral 1990's  . Coronary angioplasty    . Ep implantable device N/A 07/11/2015    Procedure: Pacemaker Implant;  Surgeon: [name], MD;  Location: [facility] LAB;  Service: Cardiovascular;  Laterality: N/A;    Family History  Problem Relation Age of Onset  . Diabetes Father   . Heart disease Father   . Diabetes Sister    Social History:  reports that he quit smoking about 31 years ago. He has never used smokeless tobacco. He reports that he drinks alcohol. He reports that he does not use illicit drugs.  Married, lives with wife.  Allergies:  Allergies  Allergen  Reactions  . Byetta 10 Mcg Pen [Exenatide] Diarrhea and Nausea And Vomiting     (Not in a hospital admission)   Lab Results: UA: ND   Recent Labs  07/18/15 0134  WBC 11.1*  HGB 8.9*  HCT 27.0*  PLT 370   BMET  Recent Labs  07/18/15 0134  NA 137  K 3.8  CL 94*  CO2 28  GLUCOSE 124*  BUN 44*  CREATININE 5.63*  CALCIUM 9.7   LFT No results for input(s): PROT, ALBUMIN, AST, ALT, ALKPHOS, BILITOT, BILIDIR, IBILI in the last 72 hours. Dg Chest 2 View  07/18/2015   CLINICAL DATA:  Cough, chest pain and shortness of breath tonight. Recent pacemaker placement.  EXAM: CHEST  2 VIEW  COMPARISON:  07/12/2015  FINDINGS: Dual lead right-sided pacemaker, leads intact. No  pneumothorax. Cardiomegaly is unchanged. Increased perivascular haziness suggestive of pulmonary edema. Probable bilateral pleural effusions with blunting of the costophrenic angles and associated bibasilar atelectasis. No acute osseous abnormalities are seen.  IMPRESSION: Small pleural effusions and perivascular haziness suggestive of pulmonary edema. Recommend correlation for CHF.   Electronically Signed   By: Jeb Levering M.D.   On: 07/18/2015 02:08    ROS: No change in vision + SOB + Chest tightness No Abd pain Lt knee pain from recent fall No dysuria  PHYSICAL EXAM: Blood pressure 120/55, pulse 64, temperature 99.1 F (37.3 C), temperature source Oral, resp. rate 18, SpO2 97 %. HEENT: PERRLA EOMI NECK:+ JVD LUNGS:Bil crackles CARDIAC:irreg, irreg with 3/6 systolic M LSB ABD:+ BS NTND no HSM EXT: Tr edema.  LUA AVF + bruit.  Tenderness and effusion Lt knee NEURO:CNI M&SI, Ox3 no asterixis  HD: [facility].  MWF 4hr, BFR 400, 2K 2Ca, DW 97.5  Assessment: 1. Pulm edema probably combination of extra fluid, AS, anemia 2. ESRD 3. Sec HPTH 4. Anemia 5. AS 6. SP pacemaker 7. DM PLAN: 1. HD this AM and try to remove fluid 2. Aranesp 3. Cont Tums 4. May need re evaluation of his aortic valve   Hunter Bachar T 07/18/2015, 6:11 AM

## 2015-07-18 NOTE — ED Notes (Signed)
RN called Resp [facility] for BIPAP

## 2015-07-18 NOTE — Consult Note (Signed)
Patient ID: Jeffrey [name] MRN: [id number], DOB/AGE: May 10, 1941   Admit date: 07/18/2015   Primary Physician: Thurman Coyer, MD Primary Cardiologist: Dr. Caryl Comes  Pt. Profile:  74 y/o male with a h/o CAD, aortic stenosis, PAF, symptomatic bradycardia s/p recent PPM insertion, type 2 diabetes mellitus, a long-standing history of hypertension, hyperlipidemia, remote history of stroke and chronic kidney disease on HD, presenting with chest pain and dyspnea, in the setting of anemia.  Problem List  Past Medical History  Diagnosis Date  . Hyperlipidemia   . Hypertension   . Aortic stenosis     a. severe by echo 09/2014  . Atrial fibrillation     a. not well documented, not on anticoagulation  . Coronary artery disease     a. chronically occluded RCA per cath 09/2014 with collaterals  . Peripheral vascular disease   . Claustrophobia   . Complete heart block     a. 11/205 resolved off AVN blocking agents b. recurred 12/2014  . Presence of permanent cardiac pacemaker   . Anginal pain   . Myocardial infarction 10/2014  . Pneumonia   . Type II diabetes mellitus   . Iron deficiency anemia   . History of blood transfusion     "related to gallbladder OR"  . History of stomach ulcers   . CVA (cerebral vascular accident) 10/2014    denies residual on 07/11/2015  . ESRD (end stage renal disease) on dialysis     a. on dialysis; [facility]; MWF, LUE fistula (07/11/2015)    Past Surgical History  Procedure Laterality Date  . Esophagogastroduodenoscopy  08/01/2012    Procedure: ESOPHAGOGASTRODUODENOSCOPY (EGD);  Surgeon: [name], MD;  Location: [facility];  Service: Endoscopy;  Laterality: N/A;  . Tonsillectomy    . Colonoscopy w/ biopsies and polypectomy    . Av fistula placement Left 10/19/2014    Procedure: BRACHIOCEPHALIC ARTERIOVENOUS (AV) FISTULA CREATION ;  Surgeon: Conrad Ruidoso Downs, MD;  Location: [place];  Service: Vascular;  Laterality: Left;  . Left and right heart  catheterization with coronary angiogram N/A 09/30/2014    Procedure: LEFT AND RIGHT HEART CATHETERIZATION WITH CORONARY ANGIOGRAM;  Surgeon: [name], MD;  Location: [facility] CATH LAB;  Service: Cardiovascular;  Laterality: N/A;  . Insertion of dialysis catheter Right 02/02/2015    Procedure: INSERTION OF DIALYSIS CATHETER  RIGHT INTERNAL JUGULAR;  Surgeon: Mal [name], MD;  Location: [place];  Service: Vascular;  Laterality: Right;  . Cardiac catheterization    . Insert / replace / remove pacemaker  07/11/2015  . Cholecystectomy open  1980's  . Cataract extraction w/ intraocular lens  implant, bilateral Bilateral 1990's  . Coronary angioplasty    . Ep implantable device N/A 07/11/2015    Procedure: Pacemaker Implant;  Surgeon: [name], MD;  Location: [place] CV LAB;  Service: Cardiovascular;  Laterality: N/A;     Allergies  Allergies  Allergen Reactions  . Byetta 10 Mcg Pen [Exenatide] Diarrhea and Nausea And Vomiting    HPI  The patient is a 74 y/o male, followed by Dr. 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  In November 2015, he presented with chest pain and ruled in for NSTEMI. Subsequent LHC by Dr. Claiborne Billings 10/01/15 demonstrated significant coronary obstructive disease with coronary calcification and total occlusion of  the proximal to mid RCA with extensive left-to-right collaterals; 50-60% stenosis in the proximal LAD after the first diagonal branch, and 30% mid AV groove circumflex stenoses. Given his collateral circulation, no intervention was performed. He was also noted to have a calcified AV with a 43 mm gradient and calculated valve area of 1.72 cm 2. His AS was felt to be moderate. Systolic function was noted to be well preserved at 65-70%.  Most recently, on 07/11/15, he  underwent insertion of a Biotronik dual chamber PPM for recurrent symptomatic bradycardia. Per records, he did have some chest pain post procedure that was felt to be related to demand ischemia with restoration of AV synchrony and faster rates. However, his device was reprogrammed VVI 60 with resolution of pain. Prior to discharge, beside echo demonstrated no effusion, CXR was unremarkable for pneumothorax and device interrogation revealed normal functioning. He was discharged home on 07/12/15.   He presented back to the [facility] ED on 07/18/15 with complaints of chest pain and dyspnea. Symptoms developed 2 days ago. He notes substernal chest pressure and tightness started after the development of a cough and increased work of breathing. CP does not radiate. Not worsened by exertion. Coughing exacerbates his pain. Denies syncope/ near syncope. No orthopnea, PND or LEE. No s/s of systemic infection, including no fever, chills, n/v/d.    On arrival to [facility], CXR showed proper pacemaker lead placement and no evidence for pneumothorax. Cardiac silhouette is unchanged compared to prior. There are however small pleural effusions and perivascular haziness suggestive of pulmonary edema.  BNP is abnormal at 2856.0 (679.8 6 months ago). EKG shows RBBB, unchanged from prior. Troponin levels are abnormal x 3 at  0.73, 0.72, 0.82. CBC is remarkable for progressive anemia with Hgb now at 7.9. Over the last 3 months, he has had serial decreases in Hgb from 12-->7.9.   He denies any recent melena but does note a 1 week h/o brown productive sputum every morning that has the appearance of dried blood.   Earlier today, he underwent HD and 2L of fluid was removed.      Home Medications  Prior to Admission medications   Medication Sig Start Date End Date Taking? Authorizing Provider  amLODipine (NORVASC) 10 MG tablet Take 10 mg by mouth daily.     Yes Historical Provider, MD  aspirin 81 MG chewable tablet Chew 81 mg by mouth  daily.   Yes Historical Provider, MD  calcium carbonate (TUMS - DOSED IN MG ELEMENTAL CALCIUM) 500 MG chewable tablet Chew 4 tablets by mouth 3 (three) times daily before meals.    Yes Historical Provider, MD  hydrALAZINE (APRESOLINE) 25 MG tablet Take 25 mg by mouth 2 (two) times daily. 05/03/15  Yes Historical Provider, MD  Insulin Detemir (LEVEMIR FLEXTOUCH) 100 UNIT/ML Pen Inject 35 Units into the skin at bedtime. 05/03/15  Yes Historical Provider, MD  lidocaine-prilocaine (EMLA) cream Apply 1 application topically 3 (three) times a week. Apply 1 application onto the left arm as directed 06/21/15  Yes Historical Provider, MD  multivitamin (RENA-VIT) TABS tablet Take 1 tablet by mouth daily.   Yes Historical Provider, MD  nitroGLYCERIN (NITROSTAT) 0.4 MG SL tablet Place 0.4 mg under the tongue every 5 (five) minutes as needed for chest pain.  10/16/13  Yes Historical Provider, MD  simvastatin (ZOCOR) 20 MG tablet Take 1 tablet (20 mg total) by mouth at bedtime. 10/02/14  Yes Theodis Blaze, MD    Family History  Family History  Problem Relation Age of Onset  . Diabetes Father   . Heart disease Father   . Diabetes Sister     Social History  Social History   Social History  . Marital Status: Married    Spouse Name: N/A  . Number of Children: N/A  . Years of Education: N/A   Occupational History  . Not on file.   Social History Main Topics  . Smoking status: Former Smoker -- 2.00 packs/day for 32 years    Quit date: 11/27/1983  . Smokeless tobacco: Never Used  . Alcohol Use: Yes     Comment: 07/11/2015 "stopped drinking in 09/2014"  . Drug Use: No  . Sexual Activity: Not on file   Other Topics Concern  . Not on file   Social History Narrative     Review of Systems General:  No chills, fever, night sweats or weight changes.  Cardiovascular:  No chest pain, dyspnea on exertion, edema, orthopnea, palpitations, paroxysmal nocturnal dyspnea. Dermatological: No rash,  lesions/masses Respiratory: No cough, dyspnea Urologic: No hematuria, dysuria Abdominal:   No nausea, vomiting, diarrhea, bright red blood per rectum, melena, or hematemesis Neurologic:  No visual changes, wkns, changes in mental status. All other systems reviewed and are otherwise negative except as noted above.  Physical Exam  Blood pressure 157/55, pulse 62, temperature 97.9 F (36.6 C), temperature source Oral, resp. rate 13, height 5\' 11"  (1.803 m), weight 214 lb 8.1 oz (97.3 kg), SpO2 95 %.  General: Pleasant, NAD Psych: Normal affect. Neuro: Alert and oriented X 3. Moves all extremities spontaneously. HEENT: Normal  Neck: mild radiation of murmur bilaterally, mild JVD. Lungs:  Unlabored, faint bibasilar rales present Heart: irregularly irregular  3/6 murmur heard throughout the precordium. Abdomen: Soft, non-tender, non-distended, BS + x 4.  Extremities: No clubbing, cyanosis or edema. DP/PT/Radials 2+ and equal bilaterally.  Labs  Troponin [facility] Test)  Recent Labs  07/18/15 0155  TROPIPOC 0.25*    Recent Labs  07/18/15 0301 07/18/15 0401 07/18/15 1039  TROPONINI 0.72* 0.73* 0.82*   Lab Results  Component Value Date   WBC 9.6 07/18/2015   HGB 7.9* 07/18/2015   HCT 23.4* 07/18/2015   MCV 92.9 07/18/2015   PLT 302 07/18/2015    Recent Labs Lab 07/18/15 0134  NA 137  K 3.8  CL 94*  CO2 28  BUN 44*  CREATININE 5.63*  CALCIUM 9.7  GLUCOSE 124*   Lab Results  Component Value Date   CHOL 100 04/28/2015   HDL 28* 04/28/2015   LDLCALC 48 04/28/2015   TRIG 121 04/28/2015   No results found for: DDIMER   Radiology/Studies  Dg Chest 2 View  07/18/2015   CLINICAL DATA:  Cough, chest pain and shortness of breath tonight. Recent pacemaker placement.  EXAM: CHEST  2 VIEW  COMPARISON:  07/12/2015  FINDINGS: Dual lead right-sided pacemaker, leads intact. No pneumothorax. Cardiomegaly is unchanged. Increased perivascular haziness suggestive of  pulmonary edema. Probable bilateral pleural effusions with blunting of the costophrenic angles and associated bibasilar atelectasis. No acute osseous abnormalities are seen.  IMPRESSION: Small pleural effusions and perivascular haziness suggestive of pulmonary edema. Recommend correlation for CHF.   Electronically Signed   By: Jeb Levering M.D.   On: 07/18/2015 02:08   Dg Chest 2 View  07/12/2015   CLINICAL DATA:  Status post pacemaker placement, low right shoulder pain  EXAM: CHEST - 2 VIEW  COMPARISON:  04/27/2015  FINDINGS: Cardiac shadow is stable. Lungs  are well aerated bilaterally. No focal infiltrate or sizable effusion is noted. Mild central vascular congestion is noted without interstitial edema. A pacing device is now seen. No pneumothorax is noted. No bony abnormality is seen.  IMPRESSION: Mild vascular congestion.  Status post pacemaker without pneumothorax.   Electronically Signed   By: Inez Catalina M.D.   On: 07/12/2015 07:41    ECG  RBBB, unchanged from prior EKGs.   ASSESSMENT AND PLAN  Principal Problem:   Acute respiratory failure Active Problems:   DM (diabetes mellitus), type 2   HLD (hyperlipidemia)   CAD (coronary artery disease)   Chest pain   Fluid overload   ESRD on hemodialysis   1. Chest Pain/ Abnormal Troponin: atypical for ischemia and exacerbated by coughing. Denies exertional component. Recent PPM insertion roughly 1 week ago, however CXR negative for pneumothorax. EKG shows chronic RBBB. Troponins however are mildly elevated x 3 (0.073-->0.072-->0.82). This is also in the setting of CKD and anemia with Hgb of 7.9. Troponin elevation, likely secondary to demand ischemia. He did have a LHC 09/2014 that showed an occluded prox-mid RCA but with L-R collaterals. Also with residual 50-60% stenosis in the proximal LAD after the first diagonal branch, and 30% mid AV groove circumflex stenoses. Will continue w/u with a 2D echo to reassess LV systolic function (normal  EF at last assessment). If significant change in LVF and/or wall motion, will continue ischemic w/u. Will also assess for pericardial effusion given recent PPM insertion/ ? Perforation. Likely Pericarditis.  2. Aortic Stenosis: doubt recent symptoms are secondary to his AS. Recent CP sounds atypical. He denies syncope/ near syncope. His last 2D echo 09/2014 suggested severe AS by measurement with peak velocity of 4.4 m/s, Mean gradient of 41 mmHg and AVR of 1.76 cm2. However, these measurements were felt to be exaggerated because of his severe bradycardia at that time. Will reassess AV velocity, gradients and valve area now that he is s/p PPM. If, measurements suggest severe AS, will need to consider AVR. Given his multiple co morbidities  he will likely need to be considered for TAVR.    3. Acute Diastolic CHF: patient with increased O2 demands, evidence of volume overload on admit, CXR c/w mild acute pulmonary edema and elevated BNP. S/p HD today with 2L volume removal. Continue volume control through HD. Will check 2D echo to reassess LVF.   4. Anemia: progressive decline in Hgb over the last 3 months from 12-->7.9. He denies melena but questionable 1 week h/o coffee ground emesis. Consider FOBT and possible GI eval for upper endoscopy. Although, this may also be subsequent to his ESRD. Continue to follow H/H. If further drops, may need to hold ASA and Plavix.   5. PPM: recently implanted 07/11/15 for symptomatic bradycardia. HR in the 60s. CXR w/o pneumothorax. Incision site well healed. Does not appear to be infected locally. May have pericarditis given recent symptoms. Device rep contacted to check device today.   6. PAF: in and out of afib this admit. HR well controlled in the 60s. CHA2DS2 VASc score is high given prior CVA, Age >75, HTN and DM. Unsure why he has not been on oral anticoagulation. However, given his anemia, he is not a candidate at this point.  He is on ASA + Plavix  7. HLD: LDL 2  months ago was well controlled at 48. Currently not on statin therapy.  8. DM: management per primary.   9. HTN: BP is well controlled. Continue to  monitor.    Signed, Lyda Jester, PA-C 07/18/2015, 2:02 PM  I have seen and examined the patient along with SIMMONS, BRITTAINY, PA-C.  I have reviewed the chart, notes and new data.  I agree with PA's note.  Key new complaints: principal complaint is pleuritic chest discomfort, worsened by cough and deep breathing Key examination changes: no pericardial rub, elevated JVD, no rales Key new findings / data: lead parameters are unchanged from implant; device programmed VVI soon post-implant, after he developed chest discomfort similar to today's complaint. Echo shows a small sliver of pericardial effusion, that was not present on last echo November 2015 (of course, he has renal failure and could have an alternative reason for this). Minor "plateau" pattern of troponin elevation is not consistent with coronary etiology, although it probably signifies adverse cardiac prognosis in longer term. Although aortic stenosis is near severe and may explain worsening HF, but is not expected to cause chest pain at rest.  PLAN: Suspect acute pericarditis due to small microperforation, without adverse effect on hemodynamics or lead performance. May simply need supportive care/symptom palliation. He seems to be fluid overloaded and still makes urine 2-3 times a day. Will try to see if diuretics improve cough. Anemia is also making CHF worse, especially in the setting of AS. Suspicion for new, acute coronary insufficiency is not high. Would not start anticoagulation for AFib in view of suspected pericarditis and anemia.  Sanda Klein, MD, [place] [phone] 07/18/2015, 5:38 PM

## 2015-07-18 NOTE — Progress Notes (Signed)
Received call from RN for Bipap per MD order. Order reviewed and confirmed by RT. RT set up Bipap, patient on Bipap at this time, no complications noted. Pt is stable at this time. RN aware.

## 2015-07-18 NOTE — Progress Notes (Signed)
Patient is seen &examined. Please see today's H&P for the details. 74 y/o male with PMH of HTN, HPL, CAD, h/o heart block s/p PPM, Aortic Stenosis, ESRD on HD is presented with SOB.  -admitted with acute hypoxic respiratory failure, pulmonary edema, chf, fluid overload. Patient is clinically stable, undergoing HD. He also reported chest tightness prior to admission, trop at 0.7. We will obtain echo, consulted cardiology for chest pains, and evaluation for aortic stenosis pend echo.    Kinnie Feil  [facility] Pager [phone]. If 7PM-7AM, please contact night-coverage at [email or web address], password [facility] 07/18/2015, 10:38 AM  LOS: 0 days

## 2015-07-18 NOTE — H&P (Signed)
[facility] Admission History and Physical       Jeffrey [name] [id number] DOB: 1941/10/15 DOA: 07/18/2015  Referring physician: EDP PCP: Thurman Coyer, MD  Specialists:   Chief Complaint: SOB   HPI: Jeffrey [name] is a 74 y.o. male with a history of ESRD on HD, P. Afib, CAD, HTN and DM2 and recent Pacemaker placement who presents to the ED with complaints of increased SOB and Cough over the past 24 hours.  He reports chest pain associated with coughing.      Review of Systems:  Constitutional: No Weight Loss, No Weight Gain, Night Sweats, Fevers, Chills, Dizziness, Light Headedness, Fatigue, or Generalized Weakness HEENT: No Headaches, Difficulty Swallowing,Tooth/Dental Problems,Sore Throat,  No Sneezing, Rhinitis, Ear Ache, Nasal Congestion, or Post Nasal Drip,  Cardio-vascular:   +Chest pain, +Orthopnea, PND, +Edema in Lower Extremities, Anasarca, Dizziness, Palpitations  Resp: No +Dyspnea, +DOE, No Productive Cough, +Non-Productive Cough, No Hemoptysis, No Wheezing.    GI: No Heartburn, Indigestion, Abdominal Pain, Nausea, Vomiting, Diarrhea, Constipation, Hematemesis, Hematochezia, Melena, Change in Bowel Habits,  Loss of Appetite  GU: No Dysuria, No Change in Color of Urine, No Urgency or Urinary Frequency, No Flank pain.  Musculoskeletal: No Joint Pain or Swelling, No Decreased Range of Motion, No Back Pain.  Neurologic: No Syncope, No Seizures, Muscle Weakness, Paresthesia, Vision Disturbance or Loss, No Diplopia, No Vertigo, No Difficulty Walking,  Skin: No Rash or Lesions. Psych: No Change in Mood or Affect, No Depression or Anxiety, No Memory loss, No Confusion, or Hallucinations   Past Medical History  Diagnosis Date  . Hyperlipidemia   . Hypertension   . Aortic stenosis     a. severe by echo 09/2014  . Atrial fibrillation     a. not well documented, not on anticoagulation  . Coronary artery disease     a. chronically occluded RCA per cath 09/2014  with collaterals  . Peripheral vascular disease   . Claustrophobia   . Complete heart block     a. 11/205 resolved off AVN blocking agents b. recurred 12/2014  . Presence of permanent cardiac pacemaker   . Anginal pain   . Myocardial infarction 10/2014  . Pneumonia   . Type II diabetes mellitus   . Iron deficiency anemia   . History of blood transfusion     "related to gallbladder OR"  . History of stomach ulcers   . CVA (cerebral vascular accident) 10/2014    denies residual on 07/11/2015  . ESRD (end stage renal disease) on dialysis     a. on dialysis; [facility]; MWF, LUE fistula (07/11/2015)     Past Surgical History  Procedure Laterality Date  . Esophagogastroduodenoscopy  08/01/2012    Procedure: ESOPHAGOGASTRODUODENOSCOPY (EGD);  Surgeon: [name], MD;  Location: [facility];  Service: Endoscopy;  Laterality: N/A;  . Tonsillectomy    . Colonoscopy w/ biopsies and polypectomy    . Av fistula placement Left 10/19/2014    Procedure: BRACHIOCEPHALIC ARTERIOVENOUS (AV) FISTULA CREATION ;  Surgeon: Conrad [place], MD;  Location: [place];  Service: Vascular;  Laterality: Left;  . Left and right heart catheterization with coronary angiogram N/A 09/30/2014    Procedure: LEFT AND RIGHT HEART CATHETERIZATION WITH CORONARY ANGIOGRAM;  Surgeon: [name], MD;  Location: [facility] CATH LAB;  Service: Cardiovascular;  Laterality: N/A;  . Insertion of dialysis catheter Right 02/02/2015    Procedure: INSERTION OF DIALYSIS CATHETER  RIGHT INTERNAL JUGULAR;  Surgeon: Mal [name],  MD;  Location: [facility] OR;  Service: Vascular;  Laterality: Right;  . Cardiac catheterization    . Insert / replace / remove pacemaker  07/11/2015  . Cholecystectomy open  1980's  . Cataract extraction w/ intraocular lens  implant, bilateral Bilateral 1990's  . Coronary angioplasty    . Ep implantable device N/A 07/11/2015    Procedure: Pacemaker Implant;  Surgeon: [name], MD;  Location: [place] CV LAB;   Service: Cardiovascular;  Laterality: N/A;      Prior to Admission medications   Medication Sig Start Date End Date Taking? Authorizing Provider  amLODipine (NORVASC) 10 MG tablet Take 10 mg by mouth daily.      Historical Provider, MD  aspirin 81 MG chewable tablet Chew 81 mg by mouth daily.    Historical Provider, MD  calcium carbonate (TUMS - DOSED IN MG ELEMENTAL CALCIUM) 500 MG chewable tablet Chew 4 tablets by mouth 3 (three) times daily before meals.     Historical Provider, MD  hydrALAZINE (APRESOLINE) 25 MG tablet Take 25 mg by mouth 2 (two) times daily. 05/03/15   Historical Provider, MD  Insulin Detemir (LEVEMIR FLEXTOUCH) 100 UNIT/ML Pen Inject 35 Units into the skin at bedtime. 05/03/15   Historical Provider, MD  lidocaine-prilocaine (EMLA) cream Apply 1 application onto the left arm as directed 06/21/15   Historical Provider, MD  multivitamin (RENA-VIT) TABS tablet Take 1 tablet by mouth daily.    Historical Provider, MD  nitroGLYCERIN (NITROSTAT) 0.4 MG SL tablet Place 0.4 mg under the tongue every 5 (five) minutes as needed for chest pain.  10/16/13   Historical Provider, MD  simvastatin (ZOCOR) 20 MG tablet Take 1 tablet (20 mg total) by mouth at bedtime. 10/02/14   Theodis Blaze, MD     Allergies  Allergen Reactions  . Byetta 10 Mcg Pen [Exenatide] Diarrhea and Nausea And Vomiting    Social History:  reports that he quit smoking about 31 years ago. He has never used smokeless tobacco. He reports that he drinks alcohol. He reports that he does not use illicit drugs.    Family History  Problem Relation Age of Onset  . Diabetes Father   . Heart disease Father   . Diabetes Sister        Physical Exam:  GEN:  Pleasant Obese  74 y.o. Caucasian male examined and in no acute distress; cooperative with exam Filed Vitals:   07/18/15 0130 07/18/15 0136 07/18/15 0245 07/18/15 0300  BP:  142/86 125/85 112/92  Pulse: 77  75 76  Temp: 99.1 F (37.3 C)     TempSrc: Oral       Resp: 24  22 21   SpO2: 94%  90% 92%   Blood pressure 112/92, pulse 76, temperature 99.1 F (37.3 C), temperature source Oral, resp. rate 21, SpO2 92 %. PSYCH: He is alert and oriented x4; does not appear anxious does not appear depressed; affect is normal HEENT: Normocephalic and Atraumatic, Mucous membranes pink; PERRLA; EOM intact; Fundi:  Benign;  No scleral icterus, Nares: Patent, Oropharynx: Clear, Edentulous,    Neck:  FROM, No Cervical Lymphadenopathy nor Thyromegaly or Carotid Bruit; No JVD; Breasts:: Not examined CHEST WALL: No tenderness CHEST: Normal respiration, clear to auscultation bilaterally HEART: Regular rate and rhythm; no murmurs rubs or gallops BACK: No kyphosis or scoliosis; No CVA tenderness ABDOMEN: Positive Bowel Sounds, Obese, Soft Non-Tender, No Rebound or Guarding; No Masses, No Organomegaly, No Pannus; No Intertriginous candida. Rectal Exam: Not done  EXTREMITIES: No  Cyanosis, Clubbing, L> R Lower Ext  Edema of BLEs; No Ulcerations. Genitalia: not examined PULSES: 2+ and symmetric SKIN: Normal hydration no rash or ulceration CNS:  Alert and Oriented x 4, No Focal Deficits Vascular: pulses palpable throughout    Labs on Admission:  Basic Metabolic Panel:  Recent Labs Lab 07/11/15 1102 07/18/15 0134  NA 137 137  K 3.5 3.8  CL 96* 94*  CO2 28 28  GLUCOSE 96 124*  BUN 36* 44*  CREATININE 4.93* 5.63*  CALCIUM 8.9 9.7   Liver Function Tests: No results for input(s): AST, ALT, ALKPHOS, BILITOT, PROT, ALBUMIN in the last 168 hours. No results for input(s): LIPASE, AMYLASE in the last 168 hours. No results for input(s): AMMONIA in the last 168 hours. CBC:  Recent Labs Lab 07/11/15 1102 07/18/15 0134  WBC 8.4 11.1*  HGB 9.0* 8.9*  HCT 26.5* 27.0*  MCV 89.8 91.2  PLT 309 370   Cardiac Enzymes: No results for input(s): CKTOTAL, CKMB, CKMBINDEX, TROPONINI in the last 168 hours.  BNP (last 3 results)  Recent Labs  01/11/15 1048  07/18/15 0209  BNP 679.8* 2856.0*    ProBNP (last 3 results) No results for input(s): PROBNP in the last 8760 hours.  CBG:  Recent Labs Lab 07/11/15 1041 07/11/15 1547 07/11/15 2121 07/12/15 0644  GLUCAP 96 83 258* 135*    Radiological Exams on Admission: Dg Chest 2 View  07/18/2015   CLINICAL DATA:  Cough, chest pain and shortness of breath tonight. Recent pacemaker placement.  EXAM: CHEST  2 VIEW  COMPARISON:  07/12/2015  FINDINGS: Dual lead right-sided pacemaker, leads intact. No pneumothorax. Cardiomegaly is unchanged. Increased perivascular haziness suggestive of pulmonary edema. Probable bilateral pleural effusions with blunting of the costophrenic angles and associated bibasilar atelectasis. No acute osseous abnormalities are seen.  IMPRESSION: Small pleural effusions and perivascular haziness suggestive of pulmonary edema. Recommend correlation for CHF.   Electronically Signed   By: Jeb Levering M.D.   On: 07/18/2015 02:08     EKG: Independently reviewed. Normal Sinus Rhythm at 90 +RBBB   Assessment/Plan:     73 y.o. male with  Principal Problem:   1.     Acute respiratory failure- due to Fluid Overload   BiPAP/ O2 PRN   Renal consulted by EDP    For Early dialysis this AM    Active Problems:   2.    Fluid overload   Dialysis this AM      3.    Chest pain   Cardiac Monitoring   Cycle Troponin   Nitropaste, O2 ASA, Metoprolol, Plavix    Continue Simvastatin     4.    CAD (coronary artery disease)   On ASA,  Metoprolol  And Simvastatin Rx        5.    ESRD on hemodialysis- his regular Schedule is MWF, but had Dialysis this past week Tues , Wednesday and Friday   Dialysis This AM      6.    DM (diabetes mellitus), type 2   SSI coverage PRN    Check HbA1C       7.    HLD (hyperlipidemia)   Continue Simvasatin     8.    DVT Prophylaxis   Lovenox    Code Status:     FULL CODE      Family Communication:   Family at Bedside        Disposition Plan:  Inpatient  Status        Time spent: [facility] Pager [phone]   If [place] Please Contact the Day Rounding Team MD for [facility]  If 7PM-7AM, Please Contact Night-Floor Coverage  [email or web address] Password [facility] 07/18/2015, 3:48 AM     ADDENDUM:   Patient was seen and examined on 07/18/2015

## 2015-07-18 NOTE — ED Notes (Signed)
Pt taken to xray 

## 2015-07-18 NOTE — ED Provider Notes (Signed)
CSN: [id number]     Arrival date & time 07/18/15  0118 History  This chart was scribed for Everlene Balls, MD by Ludger Nutting, ED Scribe. This patient was seen in room A04C/A04C and the patient's care was started 2:01 AM.    Chief Complaint  Patient presents with  . Chest Pain  . Shortness of Breath    The history is provided by the patient. No language interpreter was used.     HPI Comments: Jeffrey [name] is a 74 y.o. male with past medical history of ESRD, MI, CAD who presents to the Emergency Department complaining of unchanged right sided, burning and pressure-like chest pain with associated SOB that began 1 day ago. He reports associated cough that is occasionally productive of sputum along with nausea, bilaterally leg swelling and leg pain. Patient states he had a pacemaker placed and was discharged 1 week ago. He receives dialysis treatment every MWF with the last occurrence on 8/19. He denies history of DVT/PE in the past. He reports requiring home oxygen but does not use this because he is unable to afford it. He denies fever, vomiting.   Past Medical History  Diagnosis Date  . Hyperlipidemia   . Hypertension   . Aortic stenosis     a. severe by echo 09/2014  . Atrial fibrillation     a. not well documented, not on anticoagulation  . Coronary artery disease     a. chronically occluded RCA per cath 09/2014 with collaterals  . Peripheral vascular disease   . Claustrophobia   . Complete heart block     a. 11/205 resolved off AVN blocking agents b. recurred 12/2014  . Presence of permanent cardiac pacemaker   . Anginal pain   . Myocardial infarction 10/2014  . Pneumonia   . Type II diabetes mellitus   . Iron deficiency anemia   . History of blood transfusion     "related to gallbladder OR"  . History of stomach ulcers   . CVA (cerebral vascular accident) 10/2014    denies residual on 07/11/2015  . ESRD (end stage renal disease) on dialysis     a. on dialysis; [facility];  MWF, LUE fistula (07/11/2015)   Past Surgical History  Procedure Laterality Date  . Esophagogastroduodenoscopy  08/01/2012    Procedure: ESOPHAGOGASTRODUODENOSCOPY (EGD);  Surgeon: [name], MD;  Location: [facility];  Service: Endoscopy;  Laterality: N/A;  . Tonsillectomy    . Colonoscopy w/ biopsies and polypectomy    . Av fistula placement Left 10/19/2014    Procedure: BRACHIOCEPHALIC ARTERIOVENOUS (AV) FISTULA CREATION ;  Surgeon: Conrad [place], MD;  Location: [place];  Service: Vascular;  Laterality: Left;  . Left and right heart catheterization with coronary angiogram N/A 09/30/2014    Procedure: LEFT AND RIGHT HEART CATHETERIZATION WITH CORONARY ANGIOGRAM;  Surgeon: [name], MD;  Location: [facility] CATH LAB;  Service: Cardiovascular;  Laterality: N/A;  . Insertion of dialysis catheter Right 02/02/2015    Procedure: INSERTION OF DIALYSIS CATHETER  RIGHT INTERNAL JUGULAR;  Surgeon: Mal [name], MD;  Location: [place];  Service: Vascular;  Laterality: Right;  . Cardiac catheterization    . Insert / replace / remove pacemaker  07/11/2015  . Cholecystectomy open  1980's  . Cataract extraction w/ intraocular lens  implant, bilateral Bilateral 1990's  . Coronary angioplasty    . Ep implantable device N/A 07/11/2015    Procedure: Pacemaker Implant;  Surgeon: [name], MD;  Location: [place] CV LAB;  Service: Cardiovascular;  Laterality: N/A;   Family History  Problem Relation Age of Onset  . Diabetes Father   . Heart disease Father   . Diabetes Sister    Social History  Substance Use Topics  . Smoking status: Former Smoker -- 2.00 packs/day for 32 years    Quit date: 11/27/1983  . Smokeless tobacco: Never Used  . Alcohol Use: Yes     Comment: 07/11/2015 "stopped drinking in 09/2014"    Review of Systems  10 Systems reviewed and all are negative for acute change except as noted in the HPI.   Allergies  Byetta 10 mcg pen  Home Medications   Prior to Admission  medications   Medication Sig Start Date End Date Taking? Authorizing Provider  amLODipine (NORVASC) 10 MG tablet Take 10 mg by mouth daily.      Historical Provider, MD  aspirin 81 MG chewable tablet Chew 81 mg by mouth daily.    Historical Provider, MD  calcium carbonate (TUMS - DOSED IN MG ELEMENTAL CALCIUM) 500 MG chewable tablet Chew 4 tablets by mouth 3 (three) times daily before meals.     Historical Provider, MD  hydrALAZINE (APRESOLINE) 25 MG tablet Take 25 mg by mouth 2 (two) times daily. 05/03/15   Historical Provider, MD  Insulin Detemir (LEVEMIR FLEXTOUCH) 100 UNIT/ML Pen Inject 35 Units into the skin at bedtime. 05/03/15   Historical Provider, MD  lidocaine-prilocaine (EMLA) cream Apply 1 application onto the left arm as directed 06/21/15   Historical Provider, MD  multivitamin (RENA-VIT) TABS tablet Take 1 tablet by mouth daily.    Historical Provider, MD  nitroGLYCERIN (NITROSTAT) 0.4 MG SL tablet Place 0.4 mg under the tongue every 5 (five) minutes as needed for chest pain.  10/16/13   Historical Provider, MD  simvastatin (ZOCOR) 20 MG tablet Take 1 tablet (20 mg total) by mouth at bedtime. 10/02/14   Theodis Blaze, MD   BP 142/86 mmHg  Pulse 77  Temp(Src) 99.1 F (37.3 C) (Oral)  Resp 24  SpO2 94% Physical Exam  Constitutional: He is oriented to person, place, and time. Vital signs are normal. He appears well-developed and well-nourished.  Non-toxic appearance. He does not appear ill. No distress.  HENT:  Head: Normocephalic and atraumatic.  Nose: Nose normal.  Mouth/Throat: Oropharynx is clear and moist. No oropharyngeal exudate.  Eyes: Conjunctivae and EOM are normal. Pupils are equal, round, and reactive to light. No scleral icterus.  Neck: Normal range of motion. Neck supple. No tracheal deviation, no edema, no erythema and normal range of motion present. No thyroid mass and no thyromegaly present.  Cardiovascular: Normal rate, regular rhythm, S1 normal, S2 normal, intact  distal pulses and normal pulses.  Exam reveals no gallop and no friction rub.   Murmur heard.  Systolic murmur is present  Pulses:      Radial pulses are 2+ on the right side, and 2+ on the left side.       Dorsalis pedis pulses are 2+ on the right side, and 2+ on the left side.  Pacemaker to right chest  Pulmonary/Chest: Breath sounds normal. Accessory muscle usage present. Tachypnea noted. He is in respiratory distress. He has no wheezes. He has no rhonchi. He has no rales.  Abdominal: Soft. Normal appearance and bowel sounds are normal. He exhibits no distension, no ascites and no mass. There is no hepatosplenomegaly. There is no tenderness. There is no rebound, no guarding and  no CVA tenderness.  Musculoskeletal: Normal range of motion. He exhibits edema (LLE). He exhibits no tenderness.  Lymphadenopathy:    He has no cervical adenopathy.  Neurological: He is alert and oriented to person, place, and time. He has normal strength. No cranial nerve deficit or sensory deficit.  Skin: Skin is warm, dry and intact. No petechiae and no rash noted. He is not diaphoretic. No erythema. No pallor.  No signs of infection, no warmth, no redness, no drainage.   Psychiatric: He has a normal mood and affect. His behavior is normal. Judgment normal.  Nursing note and vitals reviewed.   ED Course  Procedures (including critical care time)  DIAGNOSTIC STUDIES: Oxygen Saturation is 90% on  2 L/min, low by my interpretation.    COORDINATION OF CARE: 2:10 AM Discussed treatment plan with pt at bedside and pt agreed to plan.   Labs Review Labs Reviewed  BASIC METABOLIC PANEL - Abnormal; Notable for the following:    Chloride 94 (*)    Glucose, Bld 124 (*)    BUN 44 (*)    Creatinine, Ser 5.63 (*)    GFR calc non Af Amer 9 (*)    GFR calc Af Amer 10 (*)    All other components within normal limits  CBC - Abnormal; Notable for the following:    WBC 11.1 (*)    RBC 2.96 (*)    Hemoglobin 8.9 (*)     HCT 27.0 (*)    All other components within normal limits  BRAIN NATRIURETIC PEPTIDE - Abnormal; Notable for the following:    B Natriuretic Peptide 2856.0 (*)    All other components within normal limits  TROPONIN I - Abnormal; Notable for the following:    Troponin I 0.72 (*)    All other components within normal limits  TROPONIN I - Abnormal; Notable for the following:    Troponin I 0.73 (*)    All other components within normal limits  I-STAT TROPOININ, ED - Abnormal; Notable for the following:    Troponin i, poc 0.25 (*)    All other components within normal limits  TROPONIN I  TROPONIN I    Imaging Review Dg Chest 2 View  07/18/2015   CLINICAL DATA:  Cough, chest pain and shortness of breath tonight. Recent pacemaker placement.  EXAM: CHEST  2 VIEW  COMPARISON:  07/12/2015  FINDINGS: Dual lead right-sided pacemaker, leads intact. No pneumothorax. Cardiomegaly is unchanged. Increased perivascular haziness suggestive of pulmonary edema. Probable bilateral pleural effusions with blunting of the costophrenic angles and associated bibasilar atelectasis. No acute osseous abnormalities are seen.  IMPRESSION: Small pleural effusions and perivascular haziness suggestive of pulmonary edema. Recommend correlation for CHF.   Electronically Signed   By: Jeb Levering M.D.   On: 07/18/2015 02:08   I have personally reviewed and evaluated these images and lab results as part of my medical decision-making.   EKG Interpretation   Date/Time:  Monday July 18 2015 01:26:26 EDT Ventricular Rate:  90 PR Interval:    QRS Duration: 148 QT Interval:  384 QTC Calculation: 469 R Axis:   129 Text Interpretation:  Right bundle branch block T wave abnormality,  consider inferior ischemia Abnormal ECG No significant change since last  tracing Confirmed by Glynn Octave [phone]) on 07/18/2015 1:31:21 AM      MDM   Final diagnoses:  None   Patient presents to emergency department for  worsening shortness of breath and cough. After recent  discharge, chest x-ray shows pleural effusions and pulmonary edema. BNP is elevated as well. This is likely heart failure versus fluid overload from end-stage renal disease. I spoke with cardiology who does not believe this is an NSTEMI.  I spoke with Dr. Arnoldo Morale with [facility] who will [facility] the patient.  Nephrology is aware as well for dialysis tomorrow.   I personally performed the services described in this documentation, which was scribed in my presence. The recorded information has been reviewed and is accurate.   Everlene Balls, MD 07/18/15 [phone]

## 2015-07-19 DIAGNOSIS — I5042 Chronic combined systolic (congestive) and diastolic (congestive) heart failure: Secondary | ICD-10-CM

## 2015-07-19 DIAGNOSIS — R072 Precordial pain: Secondary | ICD-10-CM

## 2015-07-19 LAB — RENAL FUNCTION PANEL
ALBUMIN: 2.9 g/dL — AB (ref 3.5–5.0)
ANION GAP: 12 (ref 5–15)
BUN: 44 mg/dL — ABNORMAL HIGH (ref 6–20)
CALCIUM: 9.3 mg/dL (ref 8.9–10.3)
CO2: 27 mmol/L (ref 22–32)
Chloride: 96 mmol/L — ABNORMAL LOW (ref 101–111)
Creatinine, Ser: 5.16 mg/dL — ABNORMAL HIGH (ref 0.61–1.24)
GFR, EST AFRICAN AMERICAN: 11 mL/min — AB (ref >60)
GFR, EST NON AFRICAN AMERICAN: 10 mL/min — AB (ref >60)
Glucose, Bld: 176 mg/dL — ABNORMAL HIGH (ref 65–99)
PHOSPHORUS: 4.3 mg/dL (ref 2.5–4.6)
POTASSIUM: 4.5 mmol/L (ref 3.5–5.1)
SODIUM: 135 mmol/L (ref 135–145)

## 2015-07-19 LAB — CBC
HEMATOCRIT: 24.9 % — AB (ref 39.0–52.0)
Hemoglobin: 8.1 g/dL — ABNORMAL LOW (ref 13.0–17.0)
MCH: 30.2 pg (ref 26.0–34.0)
MCHC: 32.5 g/dL (ref 30.0–36.0)
MCV: 92.9 fL (ref 78.0–100.0)
PLATELETS: 293 10*3/uL (ref 150–400)
RBC: 2.68 MIL/uL — AB (ref 4.22–5.81)
RDW: 14.6 % (ref 11.5–15.5)
WBC: 10 10*3/uL (ref 4.0–10.5)

## 2015-07-19 LAB — GLUCOSE, CAPILLARY
GLUCOSE-CAPILLARY: 161 mg/dL — AB (ref 65–99)
GLUCOSE-CAPILLARY: 158 mg/dL — AB (ref 65–99)
Glucose-Capillary: 156 mg/dL — ABNORMAL HIGH (ref 65–99)
Glucose-Capillary: 160 mg/dL — ABNORMAL HIGH (ref 65–99)
Glucose-Capillary: 151 mg/dL — ABNORMAL HIGH (ref 65–99)

## 2015-07-19 LAB — BASIC METABOLIC PANEL
Anion gap: 11 (ref 5–15)
BUN: 34 mg/dL — AB (ref 6–20)
CO2: 28 mmol/L (ref 22–32)
Calcium: 8.5 mg/dL — ABNORMAL LOW (ref 8.9–10.3)
Chloride: 95 mmol/L — ABNORMAL LOW (ref 101–111)
Creatinine, Ser: 4.32 mg/dL — ABNORMAL HIGH (ref 0.61–1.24)
GFR, EST AFRICAN AMERICAN: 14 mL/min — AB (ref >60)
GFR, EST NON AFRICAN AMERICAN: 12 mL/min — AB (ref >60)
Glucose, Bld: 159 mg/dL — ABNORMAL HIGH (ref 65–99)
POTASSIUM: 4.3 mmol/L (ref 3.5–5.1)
SODIUM: 134 mmol/L — AB (ref 135–145)

## 2015-07-19 MED ORDER — SODIUM CHLORIDE 0.9 % IV SOLN: 100.0000 mL | INTRAVENOUS | Status: DC | PRN

## 2015-07-19 MED ORDER — LIDOCAINE HCL (PF) 1 % IJ SOLN: 5.0000 mL | INTRAMUSCULAR | Status: DC | PRN

## 2015-07-19 MED ORDER — PENTAFLUOROPROP-TETRAFLUOROETH EX AERO: 1.0000 "application " | INHALATION_SPRAY | CUTANEOUS | Status: DC | PRN

## 2015-07-19 MED ORDER — NEPRO/CARBSTEADY PO LIQD: 237.0000 mL | ORAL | Status: DC | PRN

## 2015-07-19 MED ORDER — ALTEPLASE 2 MG IJ SOLR
2.0000 mg | Freq: Once | INTRAMUSCULAR | Status: DC | PRN
  Filled 2015-07-19: qty 2

## 2015-07-19 MED ORDER — HEPARIN SODIUM (PORCINE) 1000 UNIT/ML DIALYSIS
1000.0000 [IU] | INTRAMUSCULAR | Status: DC | PRN
  Filled 2015-07-19: qty 1

## 2015-07-19 MED ORDER — DOXYCYCLINE HYCLATE 100 MG PO TABS
100.0000 mg | ORAL_TABLET | Freq: Two times a day (BID) | ORAL | Status: DC
  Administered 2015-07-19 – 2015-07-20 (×3): 100 mg via ORAL
  Filled 2015-07-19 (×3): qty 1

## 2015-07-19 MED ORDER — LIDOCAINE-PRILOCAINE 2.5-2.5 % EX CREA
1.0000 "application " | TOPICAL_CREAM | CUTANEOUS | Status: DC | PRN
  Filled 2015-07-19: qty 5

## 2015-07-19 NOTE — Progress Notes (Signed)
Pt arrived to floor at 1706, via stretcher.  A&0x4.  Oriented to room.  Call bell at bedside and instructed to call for assistance.  Verbalized understanding.  Karie Kirks, Therapist, sports.

## 2015-07-19 NOTE — Progress Notes (Addendum)
Attempted report to 3E x2.

## 2015-07-19 NOTE — Progress Notes (Signed)
Patient Name: Jeffrey [name] Date of Encounter: 07/19/2015  Principal Problem:   Acute respiratory failure Active Problems:   DM (diabetes mellitus), type 2   HLD (hyperlipidemia)   CAD (coronary artery disease)   Chest pain   Fluid overload   ESRD on hemodialysis   Length of Stay: 1  SUBJECTIVE  Pleuritic chest pain no longer a problem. Cough with deep inspiration and orthopnea, weakness persist, despite dialysis yesterday and -1.8L balance. No diuresis after loop diuretics.  CURRENT MEDS . antiseptic oral rinse  7 mL Mouth Rinse BID  . aspirin  325 mg Oral Daily  . calcium carbonate  4 tablet Oral TID WC  . clopidogrel  75 mg Oral Daily  . darbepoetin (ARANESP) injection - DIALYSIS  100 mcg Intravenous Q Mon-HD  . heparin  5,000 Units Subcutaneous 3 times per day  . insulin aspart  0-9 Units Subcutaneous TID WC  . metoprolol tartrate  12.5 mg Oral BID  . multivitamin  1 tablet Oral QHS  . nitroGLYCERIN  1 inch Topical 4 times per day  . sodium chloride  3 mL Intravenous Q12H    OBJECTIVE   Intake/Output Summary (Last 24 hours) at 07/19/15 0948 Last data filed at 07/19/15 0830  Gross per 24 hour  Intake    846 ml  Output   2251 ml  Net  -1405 ml   Filed Weights   07/18/15 1155  Weight: 214 lb 8.1 oz (97.3 kg)    PHYSICAL EXAM Filed Vitals:   07/19/15 0000 07/19/15 0400 07/19/15 0409 07/19/15 0757  BP: 152/103 113/48 113/48 151/65  Pulse: 79 75 76 57  Temp:   98 F (36.7 C) 98.2 F (36.8 C)  TempSrc:   Oral Oral  Resp: 25 15 23 22   Height:      Weight:      SpO2: 93% 91% 92% 97%   General: Alert, oriented x3, no distress Head: no evidence of trauma, PERRL, EOMI, no exophtalmos or lid lag, no myxedema, no xanthelasma; normal ears, nose and oropharynx Neck: 8-9 cm elevation in jugular venous pulsations and no hepatojugular reflux; carotid pulses with mild delay and bilateral carotid bruits Chest: clear to auscultation, no signs of consolidation by  percussion or palpation, normal fremitus, symmetrical and full respiratory excursions Cardiovascular: normal position and quality of the apical impulse, regular rhythm, normal first and second heart sounds, no rubs or gallops, 3/6 mid peaking systolic aortic murmur Abdomen: no tenderness or distention, no masses by palpation, no abnormal pulsatility or arterial bruits, normal bowel sounds, no hepatosplenomegaly Extremities: no clubbing, cyanosis or edema; 2+ radial, ulnar and brachial pulses bilaterally; 2+ right femoral, posterior tibial and dorsalis pedis pulses; 2+ left femoral, posterior tibial and dorsalis pedis pulses; no subclavian or femoral bruits Neurological: grossly nonfocal  LABS  CBC  Recent Labs  07/18/15 0835 07/19/15 0241  WBC 9.6 10.0  HGB 7.9* 8.1*  HCT 23.4* 24.9*  MCV 92.9 92.9  PLT 302 0000000   Basic Metabolic Panel  Recent Labs  07/18/15 0134 07/19/15 0241  NA 137 134*  K 3.8 4.3  CL 94* 95*  CO2 28 28  GLUCOSE 124* 159*  BUN 44* 34*  CREATININE 5.63* 4.32*  CALCIUM 9.7 8.5*   Liver Function Tests No results for input(s): AST, ALT, ALKPHOS, BILITOT, PROT, ALBUMIN in the last 72 hours. No results for input(s): LIPASE, AMYLASE in the last 72 hours. Cardiac Enzymes  Recent Labs  07/18/15 0401 07/18/15 1039 07/18/15 1603  TROPONINI 0.73* 0.82* 0.64*   Radiology Studies Imaging results have been reviewed and Dg Chest 2 View  07/18/2015   CLINICAL DATA:  Cough, chest pain and shortness of breath tonight. Recent pacemaker placement.  EXAM: CHEST  2 VIEW  COMPARISON:  07/12/2015  FINDINGS: Dual lead right-sided pacemaker, leads intact. No pneumothorax. Cardiomegaly is unchanged. Increased perivascular haziness suggestive of pulmonary edema. Probable bilateral pleural effusions with blunting of the costophrenic angles and associated bibasilar atelectasis. No acute osseous abnormalities are seen.  IMPRESSION: Small pleural effusions and perivascular haziness  suggestive of pulmonary edema. Recommend correlation for CHF.   Electronically Signed   By: Jeb Levering M.D.   On: 07/18/2015 02:08    TELE NSR, mostly 1:1 conduction  ECHO Left ventricle: LVEF is approximately 50% with distal inferior and apical hypokinesis. The cavity size was normal. Wall thickness was normal. Systolic function was normal. The estimated ejection fraction was in the range of 55% to 60%. - Aortic valve: AV is thickened, calcified with restricted motion Peak and mean gradiehts through the valve are 75 and 45 mm Hg respectively consistent with severe AS. - Mitral valve: There was mild regurgitation. - Left atrium: The atrium was severely dilated. - Right ventricle: The cavity size was mildly dilated. Wall thickness was normal. - Right atrium: The atrium was moderately dilated. - Pulmonary arteries: PA peak pressure: 32 mm Hg (S). - Pericardium, extracardiac: A trivial pericardial effusion was identified  CATH 09/30/2014 ANGIOGRAPHY:   Fluoroscopy revealed coronary calcification as well as significant calcification of the aortic valve with reduced excursion. Left main: moderate size vessel which bifurcated into the LAD and left circumflex coronary arteries LAD: heart size vessel that had 50-60% stenosis distal to the first diagonal branch in the proximal segment. There were moderate septal collaterals supplying the distal RCA. The LAD gave rise to 3 diagonal branches and extended to the apex and was otherwise free of significant obstructive disease. Left circumflex: moderate size vessel that gave rise to 2 marginal branches. There was 30% smooth narrowing in the mid AV groove circumflex. Right coronary artery: total occlusion in the proximal to mid segment after a small RV branch. There were extensive left-to-right collaterals supplying the PLA and PDA predominantly via septal perforating arteries originating from the LAD. Left ventriculography was  not performed due to the patient's severe chronic kidney disease  ECG SR, 1st deg AVB, RBBB, PVCs  ASSESSMENT AND PLAN  1. Mild acute pericarditis, suspect microperforation. Trivial effusion. I think he will do fine with conservative management 2. Acute on chronic diastolic heart failure - combination of progression to severe aortic stenosis and ESRD. Poor response to high dose loop diuretic, will need HD for additional volume removal. Will ask Dr. Jonnie Finner to see him for additional volume removal via HD. 3. Severe aortic stenosis, now symptomatic. Not sure he is a candidate for conventional AVR (history of stroke, ESRD, some degree of deconditioning) and does not need coronary revascularization (RCA CTO, otherwise mild-moderate disease).. Will ask Dr. Burt Knack to evaluate for TAVR. Not sure about TAVR results in ESRD, but may be a better option for him. 4. Moderate anemia - might benefit from blood transfusion during HD. Hgb has dropped >2 g/dL since June and anemia is more deleterious with AS and CHF. Recheck iron status? (OK in February, may have been rechecked at dialysis center) 5. Dual chamber permanent pacemaker, recently placed. Normal lead parameters and good wound healing. Programmed VVIR backup currently due to concern re: demand  ischemia. I think after we optimize volume status can try programming DDDR again. 6. CAD with RCA-CTO and collateral filling   Sanda Klein, MD, [facility] [phone] office [phone] pager 07/19/2015 9:48 AM

## 2015-07-19 NOTE — Progress Notes (Signed)
Per RN, pt said his wife pulled a tick off his leg. Will start Doxycycline empirically.

## 2015-07-19 NOTE — Consult Note (Signed)
CARDIOLOGY CONSULT NOTE  Patient ID: Jeffrey [name], MRN: [id number], DOB/AGE: [id number] 76 y.o. Admit date: 07/18/2015 Date of Consult: 07/19/2015  Primary Physician: Thurman Coyer, MD Primary Cardiologist: Dr Caryl Comes Referring Physician: Dr Sallyanne Kuster  Chief Complaint: Shortness of breath Reason for Consultation: Severe aortic stenosis  HPI: 74 yo male with multiple medical problems, admitted 8-22 with chest pain and dyspnea after undergoing permanent pacemaker placement for symptomatic bradycardia. He has developed ESRD requiring hemodialysis over the past 6-8 months. During dialysis, he has had problems with symptomatic hypotension and bradycardia. He has longstanding diabetes and developed progressive renal disease over many years. The patient has been in poor health since he was hospitalized in November 2015. At that time he underwent cardiac catheterization demonstrating moderate CAD and moderate AS (mean gradient 43 mmHg but AVA calculated 1.7 square cm because of high CO). Medical therapy was recommended.   Over the past several months, the patient has become increasingly short of breath with minimal activity. He has had episodes of chest pressure at rest and with activity. Also has had frequent lightheadedness and hypotension with hemodialysis.   The patient's wife is at the bedside and she provides many of the details of his medical history. 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 He does not drink alcohol. Had previously been active. They have 3 children and 5 grandchildren, and live in [place].   Medical History:  Past Medical History  Diagnosis Date  . Hyperlipidemia   . Hypertension   . Aortic stenosis     a. severe by echo 09/2014  . Atrial fibrillation     a. not well documented, not on anticoagulation  . Coronary artery disease     a. chronically occluded RCA per cath 09/2014 with collaterals  . Peripheral vascular disease   .  Claustrophobia   . Complete heart block     a. 11/205 resolved off AVN blocking agents b. recurred 12/2014  . Presence of permanent cardiac pacemaker   . Anginal pain   . Myocardial infarction 10/2014  . Pneumonia   . Type II diabetes mellitus   . Iron deficiency anemia   . History of blood transfusion     "related to gallbladder OR"  . History of stomach ulcers   . CVA (cerebral vascular accident) 10/2014    denies residual on 07/11/2015  . ESRD (end stage renal disease) on dialysis     a. on dialysis; [facility]; MWF, LUE fistula (07/11/2015)      Surgical History:  Past Surgical History  Procedure Laterality Date  . Esophagogastroduodenoscopy  08/01/2012    Procedure: ESOPHAGOGASTRODUODENOSCOPY (EGD);  Surgeon: [name], MD;  Location: [facility];  Service: Endoscopy;  Laterality: N/A;  . Tonsillectomy    . Colonoscopy w/ biopsies and polypectomy    . Av fistula placement Left 10/19/2014    Procedure: BRACHIOCEPHALIC ARTERIOVENOUS (AV) FISTULA CREATION ;  Surgeon: Conrad [place], MD;  Location: [facility];  Service: Vascular;  Laterality: Left;  . Left and right heart catheterization with coronary angiogram N/A 09/30/2014    Procedure: LEFT AND RIGHT HEART CATHETERIZATION WITH CORONARY ANGIOGRAM;  Surgeon: [name], MD;  Location[facility];  Service: Cardiovascular;  Laterality: N/A;  . Insertion of dialysis catheter Right 02/02/2015    Procedure: INSERTION OF DIALYSIS CATHETER  RIGHT INTERNAL JUGULAR;  Surgeon: Mal [name], MD;  Location: [facility];  Service: Vascular;  Laterality: Right;  .  Cardiac catheterization    . Insert / replace / remove pacemaker  07/11/2015  . Cholecystectomy open  1980's  . Cataract extraction w/ intraocular lens  implant, bilateral Bilateral 1990's  . Coronary angioplasty    . Ep implantable device N/A 07/11/2015    Procedure: Pacemaker Implant;  Surgeon: [name], MD;  Location: [facility] CV [facility];  Service: Cardiovascular;   Laterality: N/A;     Home Meds: Prior to Admission medications   Medication Sig Start Date End Date Taking? Authorizing Provider  amLODipine (NORVASC) 10 MG tablet Take 10 mg by mouth daily.     Yes Historical Provider, MD  aspirin 81 MG chewable tablet Chew 81 mg by mouth daily.   Yes Historical Provider, MD  calcium carbonate (TUMS - DOSED IN MG ELEMENTAL CALCIUM) 500 MG chewable tablet Chew 4 tablets by mouth 3 (three) times daily before meals.    Yes Historical Provider, MD  hydrALAZINE (APRESOLINE) 25 MG tablet Take 25 mg by mouth 2 (two) times daily. 05/03/15  Yes Historical Provider, MD  Insulin Detemir (LEVEMIR FLEXTOUCH) 100 UNIT/ML Pen Inject 35 Units into the skin at bedtime. 05/03/15  Yes Historical Provider, MD  lidocaine-prilocaine (EMLA) cream Apply 1 application topically 3 (three) times a week. Apply 1 application onto the left arm as directed 06/21/15  Yes Historical Provider, MD  multivitamin (RENA-VIT) TABS tablet Take 1 tablet by mouth daily.   Yes Historical Provider, MD  nitroGLYCERIN (NITROSTAT) 0.4 MG SL tablet Place 0.4 mg under the tongue every 5 (five) minutes as needed for chest pain.  10/16/13  Yes Historical Provider, MD  simvastatin (ZOCOR) 20 MG tablet Take 1 tablet (20 mg total) by mouth at bedtime. 10/02/14  Yes Theodis Blaze, MD    Inpatient Medications:  . antiseptic oral rinse  7 mL Mouth Rinse BID  . aspirin  325 mg Oral Daily  . calcium carbonate  4 tablet Oral TID WC  . clopidogrel  75 mg Oral Daily  . darbepoetin (ARANESP) injection - DIALYSIS  100 mcg Intravenous Q Mon-HD  . heparin  5,000 Units Subcutaneous 3 times per day  . insulin aspart  0-9 Units Subcutaneous TID WC  . metoprolol tartrate  12.5 mg Oral BID  . multivitamin  1 tablet Oral QHS  . nitroGLYCERIN  1 inch Topical 4 times per day  . sodium chloride  3 mL Intravenous Q12H      Allergies:  Allergies  Allergen Reactions  . Byetta 10 Mcg Pen [Exenatide] Diarrhea and Nausea And  Vomiting    Social History   Social History  . Marital Status: Married    Spouse Name: N/A  . Number of Children: N/A  . Years of Education: N/A   Occupational History  . Not on file.   Social History Main Topics  . Smoking status: Former Smoker -- 2.00 packs/day for 32 years    Quit date: 11/27/1983  . Smokeless tobacco: Never Used  . Alcohol Use: Yes     Comment: 07/11/2015 "stopped drinking in 09/2014"  . Drug Use: No  . Sexual Activity: Not on file   Other Topics Concern  . Not on file   Social History Narrative     Family History  Problem Relation Age of Onset  . Diabetes Father   . Heart disease Father   . Diabetes Sister      Review of Systems: General: negative for chills, fever, night sweats or weight changes.  ENT: negative for  rhinorrhea or epistaxis Cardiovascular: see HPI Dermatological: negative for rash Respiratory: positive for cough and shortness of breath GI: negative for nausea, vomiting, diarrhea, bright red blood per rectum, melena, or hematemesis GU: no hematuria, urgency, or frequency. Still makes some urine.  Neurologic: positive for dizziness Heme: no easy bruising or bleeding Endo: negative for excessive thirst, thyroid disorder, or flushing Musculoskeletal: negative for joint pain or swelling, negative for myalgias  All other systems reviewed and are otherwise negative except as noted above.  Physical Exam: Blood pressure 114/49, pulse 55, temperature 98.1 F (36.7 C), temperature source Oral, resp. rate 14, height 5\' 11"  (1.803 m), weight 214 lb 8.1 oz (97.3 kg), SpO2 94 %. Pt is alert and oriented, pleasant elderly male in no distress. HEENT: normal Neck: JVP normal. Carotid upstrokes normal with bilateral L>R bruits. No thyromegaly. Lungs: equal expansion, clear bilaterally CV: Apex is discrete and nondisplaced, RRR with grade 3/6 harsh systolic murmur at the RUSB Abd: soft, NT, +BS, no bruit, no hepatosplenomegaly Back: no CVA  tenderness Ext: no C/C/E Skin: warm and dry without rash Neuro: CNII-XII intact             Strength intact = bilaterally    Labs:  Recent Labs  07/18/15 0301 07/18/15 0401 07/18/15 1039 07/18/15 1603  TROPONINI 0.72* 0.73* 0.82* 0.64*   Lab Results  Component Value Date   WBC 10.0 07/19/2015   HGB 8.1* 07/19/2015   HCT 24.9* 07/19/2015   MCV 92.9 07/19/2015   PLT 293 07/19/2015    Recent Labs Lab 07/19/15 0241  NA 134*  K 4.3  CL 95*  CO2 28  BUN 34*  CREATININE 4.32*  CALCIUM 8.5*  GLUCOSE 159*   Lab Results  Component Value Date   CHOL 100 04/28/2015   HDL 28* 04/28/2015   LDLCALC 48 04/28/2015   TRIG 121 04/28/2015   No results found for: DDIMER  Radiology/Studies:  Dg Chest 2 View  07/18/2015   CLINICAL DATA:  Cough, chest pain and shortness of breath tonight. Recent pacemaker placement.  EXAM: CHEST  2 VIEW  COMPARISON:  07/12/2015  FINDINGS: Dual lead right-sided pacemaker, leads intact. No pneumothorax. Cardiomegaly is unchanged. Increased perivascular haziness suggestive of pulmonary edema. Probable bilateral pleural effusions with blunting of the costophrenic angles and associated bibasilar atelectasis. No acute osseous abnormalities are seen.  IMPRESSION: Small pleural effusions and perivascular haziness suggestive of pulmonary edema. Recommend correlation for CHF.   Electronically Signed   By: Jeb Levering M.D.   On: 07/18/2015 02:08   Dg Chest 2 View  07/12/2015   CLINICAL DATA:  Status post pacemaker placement, low right shoulder pain  EXAM: CHEST - 2 VIEW  COMPARISON:  04/27/2015  FINDINGS: Cardiac shadow is stable. Lungs are well aerated bilaterally. No focal infiltrate or sizable effusion is noted. Mild central vascular congestion is noted without interstitial edema. A pacing device is now seen. No pneumothorax is noted. No bony abnormality is seen.  IMPRESSION: Mild vascular congestion.  Status post pacemaker without pneumothorax.    Electronically Signed   By: Inez Catalina M.D.   On: 07/12/2015 07:41    EKG: NSR with RBBB  Cardiac Studies: 2D Echo: Study Conclusions  - Left ventricle: LVEF is approximately 50% with distal inferior and apical hypokinesis. The cavity size was normal. Wall thickness was normal. Systolic function was normal. The estimated ejection fraction was in the range of 55% to 60%. - Aortic valve: AV is thickened, calcified with restricted motion  Peak and mean gradiehts through the valve are 75 and 45 mm Hg respectively conssitent with severe AS. - Mitral valve: There was mild regurgitation. - Left atrium: The atrium was severely dilated. - Right ventricle: The cavity size was mildly dilated. Wall thickness was normal. - Right atrium: The atrium was moderately dilated. - Pulmonary arteries: PA peak pressure: 32 mm Hg (S). - Pericardium, extracardiac: A trivial pericardial effusion was identified.  CARDIAC CATH STUDY 09/30/2014: HEMODYNAMICS:   RA: 10 RV: 40/8 PA: 43/13 PC: mean 20 LV: 178/16  AO: 135/50  Oxygen saturation in the aorta 93% and the pulmonary artery 69%  Cardiac output: 7.2 l/min (Thermo); 8.6 (Fick)  Cardiac index: 3.2 l/m/m2 3.8  Aortic valve peak to peak gradient 43, mean gradient 43; aortic valve area 1.72 cm with an index of 0.76  ANGIOGRAPHY:   Fluoroscopy revealed coronary calcification as well as significant calcification of the aortic valve with reduced excursion.  Left main: moderate size vessel which bifurcated into the LAD and left circumflex coronary arteries  LAD: heart size vessel that had 50-60% stenosis distal to the first diagonal branch in the proximal segment. There were moderate septal collaterals supplying the distal RCA. The LAD gave rise to 3 diagonal branches and extended to the apex and was otherwise free of significant obstructive disease.  Left circumflex: moderate size vessel that gave rise to 2  marginal branches. There was 30% smooth narrowing in the mid AV groove circumflex.  Right coronary artery: total occlusion in the proximal to mid segment after a small RV branch. There were extensive left-to-right collaterals supplying the PLA and PDA predominantly via septal perforating arteries originating from the LAD.  Left ventriculography was not performed due to the patient's severe chronic kidney disease  Total contrast: 70 cc Omnipaque  IMPRESSION:  Significant coronary obstructive disease with coronary calcification and total occlusion of the proximal to mid RCA with extensive left-to-right collaterals; 50 - 60% stenosis in the proximal LAD after the first diagonal branch, and 30% mid AV groove circumflex stenoses.  Calcified aortic valve with a 43 mm gradient in this patient with atrial fibrillation and calculated valve area of 1.72 cm  Mild pulmonary hypertension.  Total contrast: 70 cc Omnipaque  RECOMMENDATIONS:  Mr. Coniglio developed advanced heart block during this admission which may be due to total occlusion of his RCA and beta blocker therapy. This has subtotally resolved with discontinuance of beta blocker and clonidine. 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 He is followed by the renal service and may need initiation of dialysis. He has aortic stenosis with a peak gradient at catheterization of 43 mm in the setting of atrial fibrillation and his valve area of 1.72 does not suggest severe AS.  STS RISK CALCULATOR Procedure: AV Replacement + CAB Risk of Mortality: 9.502% Morbidity or Mortality: 39.672% Long Length of Stay: 25.484% Short Length of Stay: 8.788% Permanent Stroke: 3.084% Prolonged Ventilation: 27.948% DSW Infection: 1.534% Renal Failure: N/A  Reoperation: 13.613% ASSESSMENT AND PLAN:  74 yo male with NYHA Class 4 CHF on background of multiple comorbid medical problems including ESRD and longstanding  diabetes, who has Stage D severe aortic stenosis. I have personally reviewed with patient's echo study and he meets all criteria for severe aortic stenosis with a mean gradient of 45 mmHg, dimensionless index of 0.221, AVA of 0.7 square cm, and peak velocity of 458 cm/s. His exam and progressive symptoms are also suggestive of severe aortic stenosis. His cardiac  cath study from last year is also reviewed and demonstrates total occlusion of the RCA with left-to-right collaterals and moderate LAD stenosis. His coronaries were difficult to fill likely secondary to high coronary flow.   I have reviewed the natural history of aortic stenosis with the patient and his family members who are present today. We have discussed the limitations of medical therapy and the poor prognosis associated with symptomatic aortic stenosis. We have also reviewed potential treatment options, including palliative medical therapy, conventional surgical aortic valve replacement, and transcatheter aortic valve replacement. We discussed treatment options in the context of this patient's specific comorbid medical conditions. I do not think this patient will be a candidate for conventional surgical aortic valve replacement considering his severe comorbidities and poor functional capacity.  He would like to pursue treatment and further evaluation for TAVR is indicated. Will arrange CT studies, repeat cardiac cath to assess CAD burden in setting of episodic chest pain, and cardiac surgical consultation. I have reviewed inpatient notes and discussed his case with Dr Sallyanne Kuster and Dr Jonnie Finner. He will remain hospitalized for several more days to undergo inpatient dialysis to treat his heart failure.   SignedSherren Mocha MD, [facility] 07/19/2015, 5:03 PM

## 2015-07-19 NOTE — Progress Notes (Addendum)
PROGRESS NOTE  Jeffrey [name] [id number] DOB: 07-05-1941 DOA: 07/18/2015 PCP: Thurman Coyer, MD  Brief history 74 year old male with history of coronary artery disease, aortic stenosis, paroxysmal atrial fibrillation, symptomatic bradycardia status post permanent pacemaker on 07/11/2015, hypertension, hyperlipidemia, stroke, ESRD presented with increasing dyspnea, chest discomfort, and cough. Chest x-ray revealed pulmonary edema with bilateral pleural effusions suggesting fluid overload. Nephrology was consulted and the patient was taken to dialysis. He endorses compliance with his outpatient dialysis. The patient was also noted to have elevated troponins. Cardiology was consulted and felt that this may be demand ischemia in the setting of ESRD, pericarditis, and fluid overloaded. Nephrology was consulted to help manage her dialysis. Assessment/Plan: Acute respiratory failure with hypoxia -Secondary to pulmonary edema and fluid overload -Presently stable on 3 L nasal cannula -Dialysis to help manage with fluid Acute on chronic diastolic CHF -[id number] echocardiogram EF 55-60%, severe aortic stenosis, inferior and apical HK -HD to help with fluid management -Continue metoprolol tartrate -Severe aortic stenosis may be contributing -daily weights Chest pain in a setting of established coronary artery disease/Pericarditis -Likely due to pericarditis-->conservative management per cardiology -Appreciate cardiology follow-up -Continue aspirin and Plavix -November 2015 catheterization total occlusion right RCA ;50-60% stenosis in the proximal LAD  -Elevated troponin due to demand ischemia in the setting of ESRD and decompensated CHF Severe aortic stenosis -Management per cardiology Paroxysmal atrial fibrillation  -Patient is going in and out of atrial fibrillation during this admission  -Continue aspirin and Plavix  -per cardiology--not a good candidate for  AC -CHADS-VASc>2 -rate controlled Hypertension -Continue metoprolol tartrate -Amlodipine and hydralazine on hold secondary to soft blood pressure Hyperlipidemia  -LDL 48  -restart simvastatin Diabetes mellitus type 2   -04/27/2015 hemoglobin A1c 6.7  -CBG is mostly controlled without Levemir at this time  -Novo[employer] sliding scale  -Monitor CBGs and start lower dose Levemir if indicated.    Family Communication:   Wife updated at beside Disposition Plan:   Transfer out of stepdown to tele     Procedures/Studies: Dg Chest 2 View  07/18/2015   CLINICAL DATA:  Cough, chest pain and shortness of breath tonight. Recent pacemaker placement.  EXAM: CHEST  2 VIEW  COMPARISON:  07/12/2015  FINDINGS: Dual lead right-sided pacemaker, leads intact. No pneumothorax. Cardiomegaly is unchanged. Increased perivascular haziness suggestive of pulmonary edema. Probable bilateral pleural effusions with blunting of the costophrenic angles and associated bibasilar atelectasis. No acute osseous abnormalities are seen.  IMPRESSION: Small pleural effusions and perivascular haziness suggestive of pulmonary edema. Recommend correlation for CHF.   Electronically Signed   By: Jeb Levering M.D.   On: 07/18/2015 02:08   Dg Chest 2 View  07/12/2015   CLINICAL DATA:  Status post pacemaker placement, low right shoulder pain  EXAM: CHEST - 2 VIEW  COMPARISON:  04/27/2015  FINDINGS: Cardiac shadow is stable. Lungs are well aerated bilaterally. No focal infiltrate or sizable effusion is noted. Mild central vascular congestion is noted without interstitial edema. A pacing device is now seen. No pneumothorax is noted. No bony abnormality is seen.  IMPRESSION: Mild vascular congestion.  Status post pacemaker without pneumothorax.   Electronically Signed   By: Inez Catalina M.D.   On: 07/12/2015 07:41         Subjective:  patient still complains of intermittent chest pain. Shortness of breath is somewhat better. Still  has dyspnea on exertion. Denies any fevers, chills, nausea, vomiting, diarrhea, abdominal pain.  Denies any hemoptysis.  Objective: Filed Vitals:   07/19/15 0947 07/19/15 1128 07/19/15 1200 07/19/15 1605  BP: 130/59 107/34 101/40 114/49  Pulse: 74 55 54 55  Temp:  98 F (36.7 C)  98.1 F (36.7 C)  TempSrc:  Oral  Oral  Resp:  11 20 14   Height:      Weight:      SpO2:  95% 96% 94%    Intake/Output Summary (Last 24 hours) at 07/19/15 1626 Last data filed at 07/19/15 1300  Gross per 24 hour  Intake    723 ml  Output    325 ml  Net    398 ml   Weight change:  Exam:   General:  Pt is alert, follows commands appropriately, not in acute distress  HEENT: No icterus, No thrush, No neck mass, [place]/AT  Cardiovascular: RRR, S1/S2, no rubs,+S3  Respiratory: bibasilar crackles. No wheezing. Good air movement.   Abdomen: Soft/+BS, non tender, non distended, no guarding; no hepatosplenomegaly  Extremities: No edema, No lymphangitis, No petechiae, No rashes, no synovitis  Data Reviewed: Basic Metabolic Panel:  Recent Labs Lab 07/18/15 0134 07/19/15 0241  NA 137 134*  K 3.8 4.3  CL 94* 95*  CO2 28 28  GLUCOSE 124* 159*  BUN 44* 34*  CREATININE 5.63* 4.32*  CALCIUM 9.7 8.5*   Liver Function Tests: No results for input(s): AST, ALT, ALKPHOS, BILITOT, PROT, ALBUMIN in the last 168 hours. No results for input(s): LIPASE, AMYLASE in the last 168 hours. No results for input(s): AMMONIA in the last 168 hours. CBC:  Recent Labs Lab 07/18/15 0134 07/18/15 0835 07/19/15 0241  WBC 11.1* 9.6 10.0  HGB 8.9* 7.9* 8.1*  HCT 27.0* 23.4* 24.9*  MCV 91.2 92.9 92.9  PLT 370 302 293   Cardiac Enzymes:  Recent Labs Lab 07/18/15 0301 07/18/15 0401 07/18/15 1039 07/18/15 1603  TROPONINI 0.72* 0.73* 0.82* 0.64*   BNP: Invalid input(s): POCBNP CBG:  Recent Labs Lab 07/18/15 1156 07/18/15 1603 07/18/15 2124 07/19/15 0756 07/19/15 1128  GLUCAP 95 165* 212* 156* 161*     Recent Results (from the past 240 hour(s))  Surgical pcr screen     Status: None   Collection Time: 07/11/15 11:02 AM  Result Value Ref Range Status   MRSA, PCR NEGATIVE NEGATIVE Final   Staphylococcus aureus NEGATIVE NEGATIVE Final    Comment:        The Xpert SA Assay (FDA approved for NASAL specimens in patients over 101 years of age), is one component of a comprehensive surveillance program.  Test performance has been validated by [facility] for patients greater than or equal to 27 year old. It is not intended to diagnose infection nor to guide or monitor treatment.      Scheduled Meds: . antiseptic oral rinse  7 mL Mouth Rinse BID  . aspirin  325 mg Oral Daily  . calcium carbonate  4 tablet Oral TID WC  . clopidogrel  75 mg Oral Daily  . darbepoetin (ARANESP) injection - DIALYSIS  100 mcg Intravenous Q Mon-HD  . heparin  5,000 Units Subcutaneous 3 times per day  . insulin aspart  0-9 Units Subcutaneous TID WC  . metoprolol tartrate  12.5 mg Oral BID  . multivitamin  1 tablet Oral QHS  . nitroGLYCERIN  1 inch Topical 4 times per day  . sodium chloride  3 mL Intravenous Q12H   Continuous Infusions:    Danean Marner, DO  [facility] Pager [phone]  If 7PM-7AM, please contact night-coverage [email or web address] Password [email or web address] 07/19/2015, 4:26 PM   LOS: 1 day

## 2015-07-19 NOTE — Progress Notes (Signed)
  [facility] Progress Note   Subjective: breathing somewhat better after HD yest, still coughing  Filed Vitals:   07/19/15 1128 07/19/15 1200 07/19/15 1605 07/19/15 1705  BP: 107/34 101/40 114/49 134/68  Pulse: 55 54 55 56  Temp: 98 F (36.7 C)  98.1 F (36.7 C) 98.3 F (36.8 C)  TempSrc: Oral  Oral Oral  Resp: 11 20 14 16   Height:      Weight:    98.3 kg (216 lb 11.4 oz)  SpO2: 95% 96% 94% 97%   Exam: HEENT: PERRLA EOMI NECK:+ JVD LUNGS:Bil crackles CARDIAC:irreg, irreg with 3/6 systolic M LSB ABD:+ BS NTND no HSM EXT: Tr edema. LUA AVF + bruit. Tenderness and effusion Lt knee NEURO:CNI M&SI, Ox3 no asterixis  HD: [facility]. MWF 4hr, BFR 400, 2K 2Ca, DW 97.5  Assessment: 1. Pulm edema probably combination of extra fluid, AS, anemia 2. ESRD 3. Sec HPTH 4. Anemia 5. AS 6. SP pacemaker 7. DM PLAN: 1. HD tomorrow am, may need another HD Thursday if still symptomatic   Kelly Splinter MD  pager [phone]    cell [phone]  07/19/2015, 5:14 PM     Recent Labs Lab 07/18/15 0134 07/19/15 0241  NA 137 134*  K 3.8 4.3  CL 94* 95*  CO2 28 28  GLUCOSE 124* 159*  BUN 44* 34*  CREATININE 5.63* 4.32*  CALCIUM 9.7 8.5*   No results for input(s): AST, ALT, ALKPHOS, BILITOT, PROT, ALBUMIN in the last 168 hours.  Recent Labs Lab 07/18/15 0134 07/18/15 0835 07/19/15 0241  WBC 11.1* 9.6 10.0  HGB 8.9* 7.9* 8.1*  HCT 27.0* 23.4* 24.9*  MCV 91.2 92.9 92.9  PLT 370 302 293   . antiseptic oral rinse  7 mL Mouth Rinse BID  . aspirin  325 mg Oral Daily  . calcium carbonate  4 tablet Oral TID WC  . clopidogrel  75 mg Oral Daily  . darbepoetin (ARANESP) injection - DIALYSIS  100 mcg Intravenous Q Mon-HD  . heparin  5,000 Units Subcutaneous 3 times per day  . insulin aspart  0-9 Units Subcutaneous TID WC  . metoprolol tartrate  12.5 mg Oral BID  . multivitamin  1 tablet Oral QHS  . nitroGLYCERIN  1 inch Topical 4 times per day  . sodium chloride  3  mL Intravenous Q12H     sodium chloride, acetaminophen **OR** acetaminophen, albuterol, HYDROcodone-homatropine, HYDROmorphone (DILAUDID) injection, nitroGLYCERIN, ondansetron **OR** ondansetron (ZOFRAN) IV, sodium chloride

## 2015-07-20 ENCOUNTER — Other Ambulatory Visit: Payer: Self-pay | Admitting: *Deleted

## 2015-07-20 ENCOUNTER — Other Ambulatory Visit (HOSPITAL_COMMUNITY): Payer: Medicare Other

## 2015-07-20 ENCOUNTER — Inpatient Hospital Stay (HOSPITAL_COMMUNITY): Payer: Medicare Other

## 2015-07-20 DIAGNOSIS — I350 Nonrheumatic aortic (valve) stenosis: Secondary | ICD-10-CM

## 2015-07-20 DIAGNOSIS — Z992 Dependence on renal dialysis: Secondary | ICD-10-CM

## 2015-07-20 DIAGNOSIS — R0789 Other chest pain: Secondary | ICD-10-CM

## 2015-07-20 DIAGNOSIS — E1165 Type 2 diabetes mellitus with hyperglycemia: Secondary | ICD-10-CM

## 2015-07-20 DIAGNOSIS — N186 End stage renal disease: Secondary | ICD-10-CM

## 2015-07-20 LAB — CBC
HCT: 27.6 % — ABNORMAL LOW (ref 39.0–52.0)
Hemoglobin: 9 g/dL — ABNORMAL LOW (ref 13.0–17.0)
MCH: 30.2 pg (ref 26.0–34.0)
MCHC: 32.6 g/dL (ref 30.0–36.0)
MCV: 92.6 fL (ref 78.0–100.0)
Platelets: 332 10*3/uL (ref 150–400)
RBC: 2.98 MIL/uL — ABNORMAL LOW (ref 4.22–5.81)
RDW: 14.8 % (ref 11.5–15.5)
WBC: 10.9 10*3/uL — ABNORMAL HIGH (ref 4.0–10.5)

## 2015-07-20 LAB — PULMONARY FUNCTION TEST
DL/VA % PRED: 108 %
DL/VA: 4.82 ml/min/mmHg/L
DLCO UNC % PRED: 53 %
DLCO cor % pred: 67 %
DLCO cor: 19.92 ml/min/mmHg
DLCO unc: 15.85 ml/min/mmHg
FEF 25-75 PRE: 0.9 L/s
FEF 25-75 Post: 1.15 L/sec
FEF2575-%CHANGE-POST: 27 %
FEF2575-%Pred-Post: 58 %
FEF2575-%Pred-Pre: 45 %
FEV1-%Change-Post: 6 %
FEV1-%PRED-PRE: 57 %
FEV1-%Pred-Post: 61 %
FEV1-PRE: 1.6 L
FEV1-Post: 1.71 L
FEV1FVC-%CHANGE-POST: 10 %
FEV1FVC-%Pred-Pre: 93 %
FEV6-%CHANGE-POST: -4 %
FEV6-%PRED-POST: 62 %
FEV6-%PRED-PRE: 65 %
FEV6-POST: 2.24 L
FEV6-PRE: 2.36 L
FEV6FVC-%Change-Post: -1 %
FEV6FVC-%PRED-POST: 105 %
FEV6FVC-%PRED-PRE: 107 %
FVC-%Change-Post: -3 %
FVC-%PRED-POST: 59 %
FVC-%Pred-Pre: 61 %
FVC-POST: 2.28 L
FVC-Pre: 2.36 L
POST FEV6/FVC RATIO: 99 %
Post FEV1/FVC ratio: 75 %
Pre FEV1/FVC ratio: 68 %
Pre FEV6/FVC Ratio: 100 %
RV % PRED: 74 %
RV: 1.83 L
TLC % PRED: 67 %
TLC: 4.49 L

## 2015-07-20 LAB — GLUCOSE, CAPILLARY
GLUCOSE-CAPILLARY: 155 mg/dL — AB (ref 65–99)
Glucose-Capillary: 166 mg/dL — ABNORMAL HIGH (ref 65–99)
Glucose-Capillary: 128 mg/dL — ABNORMAL HIGH (ref 65–99)
Glucose-Capillary: 148 mg/dL — ABNORMAL HIGH (ref 65–99)

## 2015-07-20 LAB — BASIC METABOLIC PANEL
ANION GAP: 14 (ref 5–15)
BUN: 52 mg/dL — AB (ref 6–20)
CHLORIDE: 92 mmol/L — AB (ref 101–111)
CO2: 27 mmol/L (ref 22–32)
Calcium: 9.3 mg/dL (ref 8.9–10.3)
Creatinine, Ser: 5.72 mg/dL — ABNORMAL HIGH (ref 0.61–1.24)
GFR calc Af Amer: 10 mL/min — ABNORMAL LOW (ref >60)
GFR, EST NON AFRICAN AMERICAN: 9 mL/min — AB (ref >60)
GLUCOSE: 167 mg/dL — AB (ref 65–99)
POTASSIUM: 4.5 mmol/L (ref 3.5–5.1)
SODIUM: 133 mmol/L — AB (ref 135–145)

## 2015-07-20 MED ORDER — MIDODRINE HCL 5 MG PO TABS
10.0000 mg | ORAL_TABLET | Freq: Three times a day (TID) | ORAL | Status: DC
  Administered 2015-07-20 – 2015-07-23 (×8): 10 mg via ORAL
  Administered 2015-07-23: 5 mg via ORAL
  Administered 2015-07-24 – 2015-07-31 (×20): 10 mg via ORAL
  Filled 2015-07-20 (×14): qty 2
  Filled 2015-07-20: qty 1
  Filled 2015-07-20 (×15): qty 2

## 2015-07-20 MED ORDER — ALBUTEROL SULFATE (2.5 MG/3ML) 0.083% IN NEBU
2.5000 mg | INHALATION_SOLUTION | Freq: Once | RESPIRATORY_TRACT | Status: AC
  Administered 2015-07-20: 2.5 mg via RESPIRATORY_TRACT

## 2015-07-20 NOTE — Progress Notes (Signed)
Patient Demographics:    Jeffrey [name], is a 74 y.o. male, DOB - 1941/05/25, [id number]  Admit date - 07/18/2015   Admitting Physician Theressa Millard, MD  Outpatient Primary MD for the patient is Thurman Coyer, MD  LOS - 2   Chief Complaint  Patient presents with  . Chest Pain  . Shortness of Breath        Subjective:    Jeffrey [name] today has, No headache, No chest pain, No abdominal pain - No Nausea, No new weakness tingling or numbness, No Cough - mildly improved shortness of breath.   Assessment  & Plan :     Acute respiratory failure with hypoxia due to acute on chronic diastolic CHF with EF [id number] underlying severe aortic stenosis in a patient with ESRD.  Dialysis for fluid removal, renal and cardiology on board, blood pressure borderline so admitted to drain to augment fluid removal during dialysis procedures. Further management per cardiology and renal. Supportive care with oxygen nebulizer treatment to continue. Beta blocker as tolerated by blood pressure.    Severe aortic stenosis. Cardiology on board, looking into TAV R procedure.   Chest pain in a setting of established coronary artery disease/Pericarditis -Likely due to pericarditis-->conservative management per cardiology, continue aspirin and Plavix.Elevated troponin due to demand ischemia in the setting of ESRD and decompensated CHF   Paroxysmal atrial fibrillation [place] VASC 2 score - 2. Patient is going in and out of atrial fibrillation during this admission , on aspirin and Plavix. Cardiology on board different" to cardiology. Per cardiology poor candidate for anticoagulation.   Hypertension - BP currently soft, low-dose beta blocker if tolerated.   Hyperlipidemia - on home dose statin   Tic removal night of  07/19/2015. Unknown duration of tick exposure, on doxycycline, L[facility] and [facility] serology ordered.   Diabetes mellitus type 2 - last A1c on 04/27/2015 6.7. Currently on sliding scale along with low-dose Levemir will monitor.      Code Status : Full  Family Communication  : Family bedside  Disposition Plan  : TBD  Consults  :  Cards, Renal  Procedures  :   CT Angela chest abdomen pelvis ordered by cardiology for possible TAVR procedure  DVT Prophylaxis  :  Heparin   Lab Results  Component Value Date   PLT 332 07/20/2015    Inpatient Medications  Scheduled Meds: . [COMPLETED] albuterol  2.5 mg Nebulization Once  . antiseptic oral rinse  7 mL Mouth Rinse BID  . aspirin  325 mg Oral Daily  . calcium carbonate  4 tablet Oral TID WC  . clopidogrel  75 mg Oral Daily  . darbepoetin (ARANESP) injection - DIALYSIS  100 mcg Intravenous Q Mon-HD  . doxycycline  100 mg Oral Q12H  . heparin  5,000 Units Subcutaneous 3 times per day  . insulin aspart  0-9 Units Subcutaneous TID WC  . metoprolol tartrate  12.5 mg Oral BID  . midodrine  10 mg Oral TID WC  . multivitamin  1 tablet Oral QHS  . nitroGLYCERIN  1 inch Topical 4 times per day  . sodium chloride  3 mL Intravenous Q12H   Continuous Infusions:  PRN Meds:.sodium chloride, sodium chloride, sodium chloride, acetaminophen **OR** acetaminophen,  albuterol, alteplase, feeding supplement (NEPRO CARB STEADY), heparin, HYDROcodone-homatropine, HYDROmorphone (DILAUDID) injection, lidocaine (PF), lidocaine-prilocaine, nitroGLYCERIN, ondansetron **OR** ondansetron (ZOFRAN) IV, pentafluoroprop-tetrafluoroeth, sodium chloride  Antibiotics  :    Anti-infectives    Start     Dose/Rate Route Frequency Ordered Stop   07/19/15 2200  doxycycline (VIBRA-TABS) tablet 100 mg     100 mg Oral Every 12 hours 07/19/15 2129          Objective:   Filed Vitals:   07/19/15 2202 07/19/15 2300 07/20/15 0639 07/20/15 0900  BP: 117/66  143/67  98/49  Pulse: 77  66 59  Temp: 97.9 F (36.6 C)  97.9 F (36.6 C) 98.5 F (36.9 C)  TempSrc: Oral  Oral Oral  Resp: 18  18 18   Height:      Weight:   98.113 kg (216 lb 4.8 oz)   SpO2: 91% 97% 96% 95%    Wt Readings from Last 3 Encounters:  07/20/15 98.113 kg (216 lb 4.8 oz)  07/12/15 99.4 kg (219 lb 2.2 oz)  07/07/15 100.608 kg (221 lb 12.8 oz)     Intake/Output Summary (Last 24 hours) at 07/20/15 1109 Last data filed at 07/20/15 0900  Gross per 24 hour  Intake    480 ml  Output    450 ml  Net     30 ml     Physical Exam  Awake Alert, Oriented X 3, No new F.N deficits, Normal affect [place].AT,PERRAL Supple Neck,No JVD, No cervical lymphadenopathy appriciated.  Symmetrical Chest wall movement, Good air movement bilaterally, basilar wheezing RRR,No Gallops,Rubs or new Murmurs, No Parasternal Heave +ve B.Sounds, Abd Soft, No tenderness, No organomegaly appriciated, No rebound - guarding or rigidity. No Cyanosis, Clubbing or edema, No new Rash or bruise       Data Review:   Micro Results Recent Results (from the past 240 hour(s))  Surgical pcr screen     Status: None   Collection Time: 07/11/15 11:02 AM  Result Value Ref Range Status   MRSA, PCR NEGATIVE NEGATIVE Final   Staphylococcus aureus NEGATIVE NEGATIVE Final    Comment:        The Xpert SA Assay (FDA approved for NASAL specimens in patients over 42 years of age), is one component of a comprehensive surveillance program.  Test performance has been validated by [facility] for patients greater than or equal to 62 year old. It is not intended to diagnose infection nor to guide or monitor treatment.     Radiology Reports Dg Chest 2 View  07/18/2015   CLINICAL DATA:  Cough, chest pain and shortness of breath tonight. Recent pacemaker placement.  EXAM: CHEST  2 VIEW  COMPARISON:  07/12/2015  FINDINGS: Dual lead right-sided pacemaker, leads intact. No pneumothorax. Cardiomegaly is unchanged. Increased  perivascular haziness suggestive of pulmonary edema. Probable bilateral pleural effusions with blunting of the costophrenic angles and associated bibasilar atelectasis. No acute osseous abnormalities are seen.  IMPRESSION: Small pleural effusions and perivascular haziness suggestive of pulmonary edema. Recommend correlation for CHF.   Electronically Signed   By: Jeb Levering M.D.   On: 07/18/2015 02:08   Dg Chest 2 View  07/12/2015   CLINICAL DATA:  Status post pacemaker placement, low right shoulder pain  EXAM: CHEST - 2 VIEW  COMPARISON:  04/27/2015  FINDINGS: Cardiac shadow is stable. Lungs are well aerated bilaterally. No focal infiltrate or sizable effusion is noted. Mild central vascular congestion is noted without interstitial edema. A pacing device is now  seen. No pneumothorax is noted. No bony abnormality is seen.  IMPRESSION: Mild vascular congestion.  Status post pacemaker without pneumothorax.   Electronically Signed   By: Inez Catalina M.D.   On: 07/12/2015 07:41     CBC  Recent Labs Lab 07/18/15 0134 07/18/15 0835 07/19/15 0241 07/20/15 0310  WBC 11.1* 9.6 10.0 10.9*  HGB 8.9* 7.9* 8.1* 9.0*  HCT 27.0* 23.4* 24.9* 27.6*  PLT 370 302 293 332  MCV 91.2 92.9 92.9 92.6  MCH 30.1 31.3 30.2 30.2  MCHC 33.0 33.8 32.5 32.6  RDW 14.3 14.4 14.6 14.8    Chemistries   Recent Labs Lab 07/18/15 0134 07/19/15 0241 07/19/15 2025 07/20/15 0310  NA 137 134* 135 133*  K 3.8 4.3 4.5 4.5  CL 94* 95* 96* 92*  CO2 28 28 27 27   GLUCOSE 124* 159* 176* 167*  BUN 44* 34* 44* 52*  CREATININE 5.63* 4.32* 5.16* 5.72*  CALCIUM 9.7 8.5* 9.3 9.3   ------------------------------------------------------------------------------------------------------------------ estimated creatinine clearance is 13.1 mL/min (by C-G formula based on Cr of 5.72). ------------------------------------------------------------------------------------------------------------------ No results for input(s): HGBA1C  in the last 72 hours. ------------------------------------------------------------------------------------------------------------------ No results for input(s): CHOL, HDL, LDLCALC, TRIG, CHOLHDL, LDLDIRECT in the last 72 hours. ------------------------------------------------------------------------------------------------------------------ No results for input(s): TSH, T4TOTAL, T3FREE, THYROIDAB in the last 72 hours.  Invalid input(s): FREET3 ------------------------------------------------------------------------------------------------------------------ No results for input(s): VITAMINB12, FOLATE, FERRITIN, TIBC, IRON, RETICCTPCT in the last 72 hours.  Coagulation profile No results for input(s): INR, PROTIME in the last 168 hours.  No results for input(s): DDIMER in the last 72 hours.  Cardiac Enzymes  Recent Labs Lab 07/18/15 0401 07/18/15 1039 07/18/15 1603  TROPONINI 0.73* 0.82* 0.64*   ------------------------------------------------------------------------------------------------------------------ Invalid input(s): POCBNP   Time Spent in minutes  35   Sinclair Alligood K M.D on 07/20/2015 at 11:09 AM  Between 7am to 7pm - Pager - [phone]  After 7pm go to [email or web address] - password [facility]  [facility] -  Office  [phone]

## 2015-07-20 NOTE — Progress Notes (Addendum)
  [facility] Progress Note   Subjective: no complaints  Filed Vitals:   07/20/15 0639 07/20/15 0900 07/20/15 1230 07/20/15 1300  BP: 143/67 98/49 132/58 124/54  Pulse: 66 59 62 65  Temp: 97.9 F (36.6 C) 98.5 F (36.9 C) 97.7 F (36.5 C)   TempSrc: Oral Oral Oral   Resp: 18 18 18 18   Height:      Weight: 98.113 kg (216 lb 4.8 oz)  97.9 kg (215 lb 13.3 oz)   SpO2: 96% 95% 93%    Exam: Alert, on HD, no distress LUNGS: faint basilar rales, improving CARDIAC:irreg, irreg with 3/6 systolic M LSB ABD:+ BS NTND no HSM EXT: Tr edema. LUA AVF + bruit. Tenderness and effusion Lt knee NEURO: Ox3 no asterixis  [facility] MWF 4hr, BFR 400, 2K 2Ca, DW 97.5 heparin none Mircera 100 q 2 Venofer 50/wk  Assessment: 1. Pulm edema / vol excess 2. ESRD 3. Sec HPTH 4. Anemia 5. AS severe 6. SP pacemaker 7. DM PLAN: 1. HD tomorrow again, cont to lower vol   Kelly Splinter MD  pager [phone]    cell [phone]  07/20/2015, 1:25 PM     Recent Labs Lab 07/19/15 0241 07/19/15 2025 07/20/15 0310  NA 134* 135 133*  K 4.3 4.5 4.5  CL 95* 96* 92*  CO2 28 27 27   GLUCOSE 159* 176* 167*  BUN 34* 44* 52*  CREATININE 4.32* 5.16* 5.72*  CALCIUM 8.5* 9.3 9.3  PHOS  --  4.3  --     Recent Labs Lab 07/19/15 2025  ALBUMIN 2.9*    Recent Labs Lab 07/18/15 0835 07/19/15 0241 07/20/15 0310  WBC 9.6 10.0 10.9*  HGB 7.9* 8.1* 9.0*  HCT 23.4* 24.9* 27.6*  MCV 92.9 92.9 92.6  PLT 302 293 332   . antiseptic oral rinse  7 mL Mouth Rinse BID  . aspirin  325 mg Oral Daily  . calcium carbonate  4 tablet Oral TID WC  . clopidogrel  75 mg Oral Daily  . darbepoetin (ARANESP) injection - DIALYSIS  100 mcg Intravenous Q Mon-HD  . doxycycline  100 mg Oral Q12H  . heparin  5,000 Units Subcutaneous 3 times per day  . insulin aspart  0-9 Units Subcutaneous TID WC  . metoprolol tartrate  12.5 mg Oral BID  . midodrine  10 mg Oral TID WC  . multivitamin  1 tablet Oral QHS  .  nitroGLYCERIN  1 inch Topical 4 times per day  . sodium chloride  3 mL Intravenous Q12H     sodium chloride, sodium chloride, sodium chloride, acetaminophen **OR** acetaminophen, albuterol, alteplase, feeding supplement (NEPRO CARB STEADY), heparin, HYDROcodone-homatropine, HYDROmorphone (DILAUDID) injection, lidocaine (PF), lidocaine-prilocaine, nitroGLYCERIN, ondansetron **OR** ondansetron (ZOFRAN) IV, pentafluoroprop-tetrafluoroeth, sodium chloride

## 2015-07-20 NOTE — Progress Notes (Signed)
Patient Name: Jeffrey [name] Date of Encounter: 07/20/2015  Principal Problem:   Acute respiratory failure Active Problems:   DM (diabetes mellitus), type 2   HLD (hyperlipidemia)   CAD (coronary artery disease)   Chest pain   Fluid overload   ESRD on hemodialysis   Acute on chronic diastolic CHF (congestive heart failure)   Length of Stay: 2  SUBJECTIVE  Still dyspneic. "rough night". No chest pain. Just being hooked up to dialysis as I examined him. Evaluated by Dr. Burt Knack last night and w/u for possible TAVR was initiated.  CURRENT MEDS . antiseptic oral rinse  7 mL Mouth Rinse BID  . aspirin  325 mg Oral Daily  . calcium carbonate  4 tablet Oral TID WC  . clopidogrel  75 mg Oral Daily  . darbepoetin (ARANESP) injection - DIALYSIS  100 mcg Intravenous Q Mon-HD  . doxycycline  100 mg Oral Q12H  . heparin  5,000 Units Subcutaneous 3 times per day  . insulin aspart  0-9 Units Subcutaneous TID WC  . metoprolol tartrate  12.5 mg Oral BID  . midodrine  10 mg Oral TID WC  . multivitamin  1 tablet Oral QHS  . nitroGLYCERIN  1 inch Topical 4 times per day  . sodium chloride  3 mL Intravenous Q12H    OBJECTIVE   Intake/Output Summary (Last 24 hours) at 07/20/15 1255 Last data filed at 07/20/15 0900  Gross per 24 hour  Intake    480 ml  Output    450 ml  Net     30 ml   Filed Weights   07/18/15 1155 07/19/15 1705 07/20/15 0639  Weight: 97.3 kg (214 lb 8.1 oz) 98.3 kg (216 lb 11.4 oz) 98.113 kg (216 lb 4.8 oz)    PHYSICAL EXAM Filed Vitals:   07/19/15 2202 07/19/15 2300 07/20/15 0639 07/20/15 0900  BP: 117/66  143/67 98/49  Pulse: 77  66 59  Temp: 97.9 F (36.6 C)  97.9 F (36.6 C) 98.5 F (36.9 C)  TempSrc: Oral  Oral Oral  Resp: 18  18 18   Height:      Weight:   98.113 kg (216 lb 4.8 oz)   SpO2: 91% 97% 96% 95%   General: Alert, oriented x3, no distress Head: no evidence of trauma, PERRL, EOMI, no exophtalmos or lid lag, no myxedema, no xanthelasma;  normal ears, nose and oropharynx Neck: 7-8 cm elevated jugular venous pulsations and no hepatojugular reflux; brisk carotid pulses without delay and no carotid bruits Chest: clear to auscultation, no signs of consolidation by percussion or palpation, normal fremitus, symmetrical and full respiratory excursions Cardiovascular: normal position and quality of the apical impulse, regular rhythm, normal first and second heart sounds, no rubs or gallops, 3/6 mid peaking systolic ejection murmur Abdomen: no tenderness or distention, no masses by palpation, no abnormal pulsatility or arterial bruits, normal bowel sounds, no hepatosplenomegaly Extremities: no clubbing, cyanosis or edema; 2+ radial, ulnar and brachial pulses bilaterally; 2+ right femoral, posterior tibial and dorsalis pedis pulses; 2+ left femoral, posterior tibial and dorsalis pedis pulses; no subclavian or femoral bruits Neurological: grossly nonfocal  LABS  CBC  Recent Labs  07/19/15 0241 07/20/15 0310  WBC 10.0 10.9*  HGB 8.1* 9.0*  HCT 24.9* 27.6*  MCV 92.9 92.6  PLT 293 [id number]   Basic Metabolic Panel  Recent Labs  07/19/15 2025 07/20/15 0310  NA 135 133*  K 4.5 4.5  CL 96* 92*  CO2 27 27  GLUCOSE 176* 167*  BUN 44* 52*  CREATININE 5.16* 5.72*  CALCIUM 9.3 9.3  PHOS 4.3  --    Liver Function Tests  Recent Labs  07/19/15 2025  ALBUMIN 2.9*   No results for input(s): LIPASE, AMYLASE in the last 72 hours. Cardiac Enzymes  Recent Labs  07/18/15 0401 07/18/15 1039 07/18/15 1603  TROPONINI 0.73* 0.82* 0.64*    Radiology Studies Imaging results have been reviewed and No results found.  TELE Not connected  ASSESSMENT AND PLAN  1. Mild acute pericarditis, suspect microperforation. No longer complains of pain. Trivial effusion. I think he will do fine with conservative management 2. Acute on chronic diastolic heart failure - combination of progression to severe aortic stenosis and ESRD.  HD for  additional volume removal today, possibly again Thursday. 3. Severe aortic stenosis, now symptomatic. Dr. Burt Knack started evaluation for TAVR. Not sure about TAVR results in ESRD, but may be his only option. 4. Moderate anemia - Hgb increased to 9 today. Recheck iron status? (OK in February, may have been rechecked at dialysis center) 5. Dual chamber permanent pacemaker, recently placed. Normal lead parameters and good wound healing. Programmed VVIR backup currently due to concern re: demand ischemia. I think after we optimize volume status can try programming DDDR again. 6. CAD with RCA-CTO and collateral filling   Sanda Klein, MD, [facility] [phone] office [phone] pager 07/20/2015 12:55 PM

## 2015-07-21 ENCOUNTER — Ambulatory Visit: Payer: Medicare Other

## 2015-07-21 ENCOUNTER — Inpatient Hospital Stay (HOSPITAL_COMMUNITY): Payer: Medicare Other

## 2015-07-21 ENCOUNTER — Encounter (HOSPITAL_COMMUNITY): Payer: Self-pay | Admitting: Thoracic Surgery (Cardiothoracic Vascular Surgery)

## 2015-07-21 DIAGNOSIS — I25110 Atherosclerotic heart disease of native coronary artery with unstable angina pectoris: Secondary | ICD-10-CM

## 2015-07-21 DIAGNOSIS — I350 Nonrheumatic aortic (valve) stenosis: Secondary | ICD-10-CM

## 2015-07-21 LAB — GLUCOSE, CAPILLARY
GLUCOSE-CAPILLARY: 139 mg/dL — AB (ref 65–99)
GLUCOSE-CAPILLARY: 120 mg/dL — AB (ref 65–99)
GLUCOSE-CAPILLARY: 174 mg/dL — AB (ref 65–99)
Glucose-Capillary: 163 mg/dL — ABNORMAL HIGH (ref 65–99)

## 2015-07-21 LAB — B. BURGDORFI ANTIBODIES: B burgdorferi Ab IgG+IgM: <0.91 {ISR} (ref 0.00–0.90)

## 2015-07-21 MED ORDER — LIDOCAINE-PRILOCAINE 2.5-2.5 % EX CREA
1.0000 "application " | TOPICAL_CREAM | CUTANEOUS | Status: DC | PRN
  Filled 2015-07-21: qty 5

## 2015-07-21 MED ORDER — HEPARIN SODIUM (PORCINE) 1000 UNIT/ML DIALYSIS
1000.0000 [IU] | INTRAMUSCULAR | Status: DC | PRN
  Filled 2015-07-21: qty 1

## 2015-07-21 MED ORDER — LIDOCAINE HCL (PF) 1 % IJ SOLN: 5.0000 mL | INTRAMUSCULAR | Status: DC | PRN

## 2015-07-21 MED ORDER — ALTEPLASE 2 MG IJ SOLR
2.0000 mg | Freq: Once | INTRAMUSCULAR | Status: DC | PRN
  Filled 2015-07-21: qty 2

## 2015-07-21 MED ORDER — SODIUM CHLORIDE 0.9 % IV SOLN: INTRAVENOUS | Status: DC

## 2015-07-21 MED ORDER — SODIUM CHLORIDE 0.9 % IV SOLN: 100.0000 mL | INTRAVENOUS | Status: DC | PRN

## 2015-07-21 MED ORDER — PENTAFLUOROPROP-TETRAFLUOROETH EX AERO: 1.0000 "application " | INHALATION_SPRAY | CUTANEOUS | Status: DC | PRN

## 2015-07-21 MED ORDER — SODIUM CHLORIDE 0.9 % IV SOLN: 250.0000 mL | INTRAVENOUS | Status: DC | PRN

## 2015-07-21 MED ORDER — NEPRO/CARBSTEADY PO LIQD: 237.0000 mL | ORAL | Status: DC | PRN

## 2015-07-21 MED ORDER — SODIUM CHLORIDE 0.9 % IJ SOLN: 3.0000 mL | Freq: Two times a day (BID) | INTRAMUSCULAR | Status: DC

## 2015-07-21 MED ORDER — DOXYCYCLINE HYCLATE 100 MG PO TABS
200.0000 mg | ORAL_TABLET | Freq: Once | ORAL | Status: AC
  Administered 2015-07-21: 200 mg via ORAL
  Filled 2015-07-21: qty 2

## 2015-07-21 MED ORDER — CALCIUM CARBONATE ANTACID 500 MG PO CHEW
1.0000 | CHEWABLE_TABLET | Freq: Three times a day (TID) | ORAL | Status: DC
  Administered 2015-07-21: 200 mg via ORAL
  Filled 2015-07-21: qty 1

## 2015-07-21 MED ORDER — IOHEXOL 350 MG/ML SOLN
100.0000 mL | Freq: Once | INTRAVENOUS | Status: AC | PRN
  Administered 2015-07-21: 100 mL via INTRAVENOUS

## 2015-07-21 MED ORDER — SODIUM CHLORIDE 0.9 % IJ SOLN
3.0000 mL | INTRAMUSCULAR | Status: DC | PRN
Start: 2015-07-21 — End: 2015-07-22

## 2015-07-21 NOTE — Progress Notes (Signed)
Patient Demographics:    Jeffrey [name], is a 74 y.o. male, DOB - 10/26/1941, ZN:[id number]  Admit date - 07/18/2015   Admitting Physician Jeffrey Millard, MD  Outpatient Primary MD for the patient is Jeffrey Coyer, MD  LOS - 3   Chief Complaint  Patient presents with  . Chest Pain  . Shortness of Breath        Subjective:    Jeffrey [name] today has, No headache, No chest pain, No abdominal pain - No Nausea, No new weakness tingling or numbness, No Cough - mildly improved shortness of breath.   Assessment  & Plan :     Acute respiratory failure with hypoxia due to acute on chronic diastolic CHF with EF [id number] underlying severe aortic stenosis in a patient with ESRD.  Dialysis for fluid removal, renal and cardiology on board, blood pressure borderline so admitted to drain to augment fluid removal during dialysis procedures. Further management per cardiology and renal. Supportive care with oxygen nebulizer treatment to continue. Beta blocker as tolerated by blood pressure.    Severe aortic stenosis. Cardiology on board, looking into TAV R procedure.   Chest pain in a setting of established coronary artery disease/Pericarditis  Likely due to pericarditis-->conservative management per cardiology, continue aspirin and Plavix.Elevated troponin due to demand ischemia in the setting of ESRD and decompensated CHF. Appreciate cardiology input.   Paroxysmal atrial fibrillation [place] VASC 2 score - 2. Patient is going in and out of atrial fibrillation during this admission , on aspirin and Plavix. Cardiology on board different" to cardiology. Per cardiology poor candidate for anticoagulation.   Hypertension - BP currently soft, low-dose beta blocker if tolerated.   Hyperlipidemia - on home dose  statin   Tic removal night of 07/19/2015. Unknown duration of tick exposure, given prophylaxis with doxycycline, [facility] serology ordered.   Diabetes mellitus type 2 - last A1c on 04/27/2015 6.7. Currently on sliding scale along with low-dose Levemir will monitor.   CBG (last 3)   Recent Labs  07/20/15 1737 07/20/15 2101 07/21/15 0614  GLUCAP 128* 148* 139*       Code Status : Full  Family Communication  : Family bedside  Disposition Plan  : TBD  Consults  :  Cards, Renal  Procedures  :   CT Angela chest abdomen pelvis ordered by cardiology for possible TAVR procedure  DVT Prophylaxis  :  Heparin   Lab Results  Component Value Date   PLT 332 07/20/2015    Inpatient Medications  Scheduled Meds: . antiseptic oral rinse  7 mL Mouth Rinse BID  . aspirin  325 mg Oral Daily  . calcium carbonate  4 tablet Oral TID WC  . clopidogrel  75 mg Oral Daily  . darbepoetin (ARANESP) injection - DIALYSIS  100 mcg Intravenous Q Mon-HD  . doxycycline  100 mg Oral Q12H  . heparin  5,000 Units Subcutaneous 3 times per day  . insulin aspart  0-9 Units Subcutaneous TID WC  . metoprolol tartrate  12.5 mg Oral BID  . midodrine  10 mg Oral TID WC  . multivitamin  1 tablet Oral QHS  . nitroGLYCERIN  1 inch Topical 4 times per day  . sodium chloride  3 mL Intravenous Q12H   Continuous Infusions:  PRN Meds:.sodium chloride, sodium chloride, sodium chloride, acetaminophen **OR** acetaminophen, albuterol, alteplase, feeding supplement (NEPRO CARB STEADY), heparin, HYDROcodone-homatropine, HYDROmorphone (DILAUDID) injection, lidocaine (PF), lidocaine-prilocaine, nitroGLYCERIN, ondansetron **OR** ondansetron (ZOFRAN) IV, pentafluoroprop-tetrafluoroeth, sodium chloride  Antibiotics  :    Anti-infectives    Start     Dose/Rate Route Frequency Ordered Stop   07/19/15 2200  doxycycline (VIBRA-TABS) tablet 100 mg     100 mg Oral Every 12 hours 07/19/15 2129           Objective:   Filed Vitals:   07/21/15 0830 07/21/15 0900 07/21/15 0930 07/21/15 1000  BP: 120/55 131/45 100/49 114/41  Pulse: 62 55 57 55  Temp:      TempSrc:      Resp: 19 19 17 17   Height:      Weight:      SpO2: 95% 97% 97% 97%    Wt Readings from Last 3 Encounters:  07/21/15 96.3 kg (212 lb 4.9 oz)  07/12/15 99.4 kg (219 lb 2.2 oz)  07/07/15 100.608 kg (221 lb 12.8 oz)     Intake/Output Summary (Last 24 hours) at 07/21/15 1025 Last data filed at 07/21/15 1001  Gross per 24 hour  Intake    240 ml  Output   1637 ml  Net  -1397 ml     Physical Exam  Awake Alert, Oriented X 3, No new F.N deficits, Normal affect .AT,PERRAL Supple Neck,No JVD, No cervical lymphadenopathy appriciated.  Symmetrical Chest wall movement, Good air movement bilaterally, basilar wheezing RRR,No Gallops,Rubs, loud aortic systolic Murmur, No Parasternal Heave +ve B.Sounds, Abd Soft, No tenderness, No organomegaly appriciated, No rebound - guarding or rigidity. No Cyanosis, Clubbing or edema, No new Rash or bruise       Data Review:   Micro Results Recent Results (from the past 240 hour(s))  Surgical pcr screen     Status: None   Collection Time: 07/11/15 11:02 AM  Result Value Ref Range Status   MRSA, PCR NEGATIVE NEGATIVE Final   Staphylococcus aureus NEGATIVE NEGATIVE Final    Comment:        The Xpert SA Assay (FDA approved for NASAL specimens in patients over 37 years of age), is one component of a comprehensive surveillance program.  Test performance has been validated by [facility] for patients greater than or equal to 20 year old. It is not intended to diagnose infection nor to guide or monitor treatment.     Radiology Reports Dg Chest 2 View  07/18/2015   CLINICAL DATA:  Cough, chest pain and shortness of breath tonight. Recent pacemaker placement.  EXAM: CHEST  2 VIEW  COMPARISON:  07/12/2015  FINDINGS: Dual lead right-sided pacemaker, leads intact. No pneumothorax.  Cardiomegaly is unchanged. Increased perivascular haziness suggestive of pulmonary edema. Probable bilateral pleural effusions with blunting of the costophrenic angles and associated bibasilar atelectasis. No acute osseous abnormalities are seen.  IMPRESSION: Small pleural effusions and perivascular haziness suggestive of pulmonary edema. Recommend correlation for CHF.   Electronically Signed   By: Jeffrey [name] M.D.   On: 07/18/2015 02:08   Dg Chest 2 View  07/12/2015   CLINICAL DATA:  Status post pacemaker placement, low right shoulder pain  EXAM: CHEST - 2 VIEW  COMPARISON:  04/27/2015  FINDINGS: Cardiac shadow is stable. Lungs are well aerated bilaterally. No focal infiltrate or sizable effusion is noted. Mild central vascular congestion is noted without interstitial edema. A pacing device  is now seen. No pneumothorax is noted. No bony abnormality is seen.  IMPRESSION: Mild vascular congestion.  Status post pacemaker without pneumothorax.   Electronically Signed   By: Inez Catalina M.D.   On: 07/12/2015 07:41     CBC  Recent Labs Lab 07/18/15 0134 07/18/15 0835 07/19/15 0241 07/20/15 0310  WBC 11.1* 9.6 10.0 10.9*  HGB 8.9* 7.9* 8.1* 9.0*  HCT 27.0* 23.4* 24.9* 27.6*  PLT 370 302 293 332  MCV 91.2 92.9 92.9 92.6  MCH 30.1 31.3 30.2 30.2  MCHC 33.0 33.8 32.5 32.6  RDW 14.3 14.4 14.6 14.8    Chemistries   Recent Labs Lab 07/18/15 0134 07/19/15 0241 07/19/15 2025 07/20/15 0310  NA 137 134* 135 133*  K 3.8 4.3 4.5 4.5  CL 94* 95* 96* 92*  CO2 28 28 27 27   GLUCOSE 124* 159* 176* 167*  BUN 44* 34* 44* 52*  CREATININE 5.63* 4.32* 5.16* 5.72*  CALCIUM 9.7 8.5* 9.3 9.3   ------------------------------------------------------------------------------------------------------------------ estimated creatinine clearance is 13 mL/min (by C-G formula based on Cr of  5.72). ------------------------------------------------------------------------------------------------------------------ No results for input(s): HGBA1C in the last 72 hours. ------------------------------------------------------------------------------------------------------------------ No results for input(s): CHOL, HDL, LDLCALC, TRIG, CHOLHDL, LDLDIRECT in the last 72 hours. ------------------------------------------------------------------------------------------------------------------ No results for input(s): TSH, T4TOTAL, T3FREE, THYROIDAB in the last 72 hours.  Invalid input(s): FREET3 ------------------------------------------------------------------------------------------------------------------ No results for input(s): VITAMINB12, FOLATE, FERRITIN, TIBC, IRON, RETICCTPCT in the last 72 hours.  Coagulation profile No results for input(s): INR, PROTIME in the last 168 hours.  No results for input(s): DDIMER in the last 72 hours.  Cardiac Enzymes  Recent Labs Lab 07/18/15 0401 07/18/15 1039 07/18/15 1603  TROPONINI 0.73* 0.82* 0.64*   ------------------------------------------------------------------------------------------------------------------ Invalid input(s): POCBNP   Time Spent in minutes  35   Jaquise Faux K M.D on 07/21/2015 at 10:25 AM  Between 7am to 7pm - Pager - [phone]  After 7pm go to [email or web address] - password [facility]  [facility] -  Office  [phone]

## 2015-07-21 NOTE — Progress Notes (Signed)
Advised the importance of bed alarm, A Ox4,coherent, pt still refused to put the bed alarm. Will continue to monitor pt

## 2015-07-21 NOTE — Evaluation (Signed)
Physical Therapy Evaluation Patient Details Name: Jeffrey [name] MRN: [id number] DOB: [birth year] Today's Date: 07/21/2015   History of Present Illness  Jeffrey [name] is a 74 y.o. male with a history of ESRD on HD, P. Afib, CAD, HTN and DM2 and recent Pacemaker placement who presents to the ED with complaints of increased SOB and Cough over the past 24 hours. He reports chest pain associated with coughing. With sever AS now planned TAV R.  Clinical Impression  Patient presents with decreased mobility due to deficits listed below in PT problem list.  He will benefit from skilled PT in the acute setting to allow return home with family assist and HHPT.  PT pre-TAVR assessment completed also as noted below.  Will follow along for future assessments as appropriate.      Follow Up Recommendations Home health PT    Equipment Recommendations  Rolling walker with 5" wheels    Recommendations for Other Services       Precautions / Restrictions Precautions Precautions: Fall      Mobility  Bed Mobility Overal bed mobility: Modified Independent             General bed mobility comments: increased time  Transfers Overall transfer level: Needs assistance Equipment used: Rolling walker (2 wheeled) Transfers: Sit to/from Stand Sit to Stand: Min guard         General transfer comment: assist for anterior weight shift  Ambulation/Gait Ambulation/Gait assistance: Min guard Ambulation Distance (Feet): 190 Feet (with three seated rest breaks) Assistive device: Rolling walker (2 wheeled) Gait Pattern/deviations: Step-through pattern;Decreased stride length;Trunk flexed;Wide base of support     General Gait Details: limited by SOB ambulating on O2 with decreased endurance throughout  Stairs            Wheelchair Mobility    Modified Rankin (Stroke Patients Only)        Balance Overall balance assessment: Needs assistance   Sitting balance-Leahy Scale: Good      Standing balance support: Bilateral upper extremity supported Standing balance-Leahy Scale: Poor Standing balance comment: UE support needed for balance due to weakness                             Pertinent Vitals/Pain Pain Assessment: No/denies pain    Home Living Family/patient expects to be discharged to:: Private residence Living Arrangements: Alone Available Help at Discharge: Family;Available 24 hours/day Type of Home: House Home Access: Stairs to enter Entrance Stairs-Rails: Right Entrance Stairs-Number of Steps: 5 Home Layout: One level Home Equipment: None Additional Comments: home O2    Prior Function Level of Independence: Needs assistance      ADL's / Homemaking Assistance Needed: recent need for help with bathing/dressing        Hand Dominance   Dominant Hand: Right    Extremity/Trunk Assessment   Upper Extremity Assessment: Generalized weakness           Lower Extremity Assessment: Generalized weakness         Communication   Communication: No difficulties  Cognition Arousal/Alertness: Awake/alert Behavior During Therapy: WFL for tasks assessed/performed Overall Cognitive Status: Within Functional Limits for tasks assessed                      General Comments   Pre-TAVR assessment  6 Minute Walk Test  Distance - 142 ft      Pre    Post  BP  123/53    124/47  HR    55    57  SaO2    95    94  Modified Borg Dyspnea 3    9  RPE    9    17    5  Meter Walk Test  Trial   1) 13.6 sec   2) 15.9 sec  3) 16.0 sec  Average - 15.2 sec = 1.07 ft/sec   Clinical Frailty Scale - 6                Exercises        Assessment/Plan    PT Assessment Patient needs continued PT services  PT Diagnosis Difficulty walking;Generalized weakness   PT Problem List Decreased strength;Cardiopulmonary status limiting activity;Decreased activity tolerance;Decreased mobility;Decreased knowledge of use  of DME  PT Treatment Interventions DME instruction;Balance training;Gait training;Stair training;Functional mobility training;Patient/family education;Therapeutic activities;Therapeutic exercise   PT Goals (Current goals can be found in the Care Plan section) Acute Rehab PT Goals Patient Stated Goal: To get stronger after procedure PT Goal Formulation: With patient Time For Goal Achievement: 08/04/15 Potential to Achieve Goals: Fair    Frequency Min 3X/week   Barriers to discharge        Co-evaluation               End of Session Equipment Utilized During Treatment: Gait belt;Oxygen Activity Tolerance: Patient limited by fatigue Patient left: in bed;with call bell/phone within reach;with family/visitor present           Time: 1425-1500 PT Time Calculation (min) (ACUTE ONLY): 35 min   Charges:   PT Evaluation $Initial PT Evaluation Tier I: 1 Procedure PT Treatments $Gait Training: 8-22 mins   PT G Codes:        Jeffrey [name],Jeffrey [name] 08/16/2015, 3:36 PM Jeffrey [name], [place] Aug 16, 2015

## 2015-07-21 NOTE — Progress Notes (Signed)
Dialysis RN called d/t pt desat to 89-90% on 5 lpm [place].  Pt in no distress, no increased WOB noted.  Pt states he "can't wear face mask" and states he is "claustrophobic".  Increased [place] to 7 lpm, sat increased to 93-95%.  No distress noted, VSS,  BBSH w/ crackles.  Dialysis RN states pt has just started dialysis and hopes to pull off a lot of fluid.  Pt aware that if he desaturates further, he may need to wear oxygen mask.

## 2015-07-21 NOTE — Care Management Important Message (Signed)
Important Message  Patient Details  Name: Jeffrey [name] MRN: [id number] Date of Birth: 1941/01/21   Medicare Important Message Given:  Yes-second notification given    Pricilla Handler 07/21/2015, 12:07 PM

## 2015-07-21 NOTE — Progress Notes (Signed)
PT Cancellation Note  Patient Details Name: Jeffrey [name] MRN: [id number] DOB: 1941-08-03   Cancelled Treatment:    Reason Eval/Treat Not Completed: Patient at procedure or test/unavailable; currently in HD.  Will attempt later today for TAV R pre-assessment as able.   Symia Herdt,CYNDI 07/21/2015, 9:51 AM  Magda Kiel, PT [phone] 07/21/2015

## 2015-07-21 NOTE — Progress Notes (Addendum)
Subjective:  Had hd today with 2 l uf / just walked  some with PT  Objective Vital signs in last 24 hours: Filed Vitals:   07/21/15 0900 07/21/15 0930 07/21/15 1000 07/21/15 1030  BP: 131/45 100/49 114/41 116/60  Pulse: 55 57 55 59  Temp:    97.1 F (36.2 C)  TempSrc:    Oral  Resp: 19 17 17 18   Height:      Weight:    93.9 kg (207 lb 0.2 oz)  SpO2: 97% 97% 97% 98%   Weight change: -0.4 kg (-14.1 oz)  Physical Exam:  General- Alert,, no distress LUNGS: rare faint basilar rales, improving CARDIAC:irreg, irreg with 3/6 systolic M LSB ABD:+ BS NTND no HSM EXT: Tr  Bipedal edema.  LUA AVF + bruit.    OP HD= [facility] MWF 4hr, BFR 400, 2K 2Ca, DW 97.5 heparin none Mircera 100 q 2 Venofer 50/wk  Problem/Plan: 1. Pulm edema / vol excess- with extensive cardiac issues uf daily gently ,hd in am normal schedule now about 4 kg below prior edw . 2. ESRD = MWF hd  3. Sec HPTH= phos4.3ca correct 10.1 decr tums 4 to 1 ac  4. Anemia= HGB 9.0 on esa  5. AS severe= Card . eval in process for AV surgery 6. SP pacemaker 7. DM   Ernest Haber, PA-C [facility] Beeper [phone] 07/21/2015,2:26 PM  LOS: 3 days   Pt seen, examined and agree w A/P as above.  Kelly Splinter MD pager [phone]    cell [phone] 07/21/2015, 4:56 PM    Labs: Basic Metabolic Panel:  Recent Labs Lab 07/19/15 0241 07/19/15 2025 07/20/15 0310  NA 134* 135 133*  K 4.3 4.5 4.5  CL 95* 96* 92*  CO2 28 27 27   GLUCOSE 159* 176* 167*  BUN 34* 44* 52*  CREATININE 4.32* 5.16* 5.72*  CALCIUM 8.5* 9.3 9.3  PHOS  --  4.3  --    Liver Function Tests:  Recent Labs Lab 07/19/15 2025  ALBUMIN 2.9*   No results for input(s): LIPASE, AMYLASE in the last 168 hours. No results for input(s): AMMONIA in the last 168 hours. CBC:  Recent Labs Lab 07/18/15 0134 07/18/15 0835 07/19/15 0241 07/20/15 0310  WBC 11.1* 9.6 10.0 10.9*  HGB 8.9* 7.9* 8.1* 9.0*  HCT 27.0* 23.4* 24.9* 27.6*  MCV 91.2  92.9 92.9 92.6  PLT 370 302 293 332   Cardiac Enzymes:  Recent Labs Lab 07/18/15 0301 07/18/15 0401 07/18/15 1039 07/18/15 1603  TROPONINI 0.72* 0.73* 0.82* 0.64*   CBG:  Recent Labs Lab 07/20/15 1210 07/20/15 1737 07/20/15 2101 07/21/15 0614 07/21/15 1132  GLUCAP 166* 128* 148* 139* 120*    Studies/Results: No results found. Medications:   . antiseptic oral rinse  7 mL Mouth Rinse BID  . aspirin  325 mg Oral Daily  . calcium carbonate  4 tablet Oral TID WC  . clopidogrel  75 mg Oral Daily  . darbepoetin (ARANESP) injection - DIALYSIS  100 mcg Intravenous Q Mon-HD  . heparin  5,000 Units Subcutaneous 3 times per day  . insulin aspart  0-9 Units Subcutaneous TID WC  . metoprolol tartrate  12.5 mg Oral BID  . midodrine  10 mg Oral TID WC  . multivitamin  1 tablet Oral QHS  . nitroGLYCERIN  1 inch Topical 4 times per day  . sodium chloride  3 mL Intravenous Q12H

## 2015-07-21 NOTE — Progress Notes (Signed)
Patient Name: Jeffrey [name] Date of Encounter: 07/21/2015  Principal Problem:  Acute respiratory failure Active Problems:  DM (diabetes mellitus), type 2  HLD (hyperlipidemia)  CAD (coronary artery disease)  Chest pain  Fluid overload  ESRD on hemodialysis  Acute on chronic diastolic CHF (congestive heart failure) Length of Stay: 3  SUBJECTIVE  Feels better. Seen in dialysis center. No chest pain, SOB or palpitation. Had dialysis yesterday. Next dialysis plan for Monday.  Evaluated by Dr. Burt Knack last night and w/u for possible TAVR was initiated.  CURRENT MEDS . antiseptic oral rinse  7 mL Mouth Rinse BID  . aspirin  325 mg Oral Daily  . calcium carbonate  4 tablet Oral TID WC  . clopidogrel  75 mg Oral Daily  . darbepoetin (ARANESP) injection - DIALYSIS  100 mcg Intravenous Q Mon-HD  . doxycycline  200 mg Oral Once  . heparin  5,000 Units Subcutaneous 3 times per day  . insulin aspart  0-9 Units Subcutaneous TID WC  . metoprolol tartrate  12.5 mg Oral BID  . midodrine  10 mg Oral TID WC  . multivitamin  1 tablet Oral QHS  . nitroGLYCERIN  1 inch Topical 4 times per day  . sodium chloride  3 mL Intravenous Q12H    OBJECTIVE  Filed Vitals:   07/21/15 0900 07/21/15 0930 07/21/15 1000 07/21/15 1030  BP: 131/45 100/49 114/41 116/60  Pulse: 55 57 55 59  Temp:    97.1 F (36.2 C)  TempSrc:    Oral  Resp: 19 17 17 18   Height:      Weight:      SpO2: 97% 97% 97% 98%    Intake/Output Summary (Last 24 hours) at 07/21/15 1044 Last data filed at 07/21/15 1030  Gross per 24 hour  Intake    240 ml  Output   3637 ml  Net  -3397 ml   Filed Weights   07/20/15 1615 07/21/15 0551 07/21/15 0747  Weight: 212 lb 11.9 oz (96.5 kg) 217 lb 3.2 oz (98.521 kg) 212 lb 4.9 oz (96.3 kg)    PHYSICAL EXAM  General: Pleasant, NAD. Neuro: Alert and oriented X 3. Moves all extremities spontaneously. Psych: Normal affect. HEENT:  Normal  Neck: Supple without bruits. +  JVD. Lungs:  Resp regular and unlabored, CTA. Heart: RRR no s3, s4. Systolic  murmurs. Abdomen: Soft, non-tender, non-distended, BS + x 4.  Extremities: No clubbing, cyanosis or edema. DP/PT/Radials 2+ and equal bilaterally.  Accessory Clinical Findings  CBC  Recent Labs  07/19/15 0241 07/20/15 0310  WBC 10.0 10.9*  HGB 8.1* 9.0*  HCT 24.9* 27.6*  MCV 92.9 92.6  PLT 293 [id number]   Basic Metabolic Panel  Recent Labs  07/19/15 2025 07/20/15 0310  NA 135 133*  K 4.5 4.5  CL 96* 92*  CO2 27 27  GLUCOSE 176* 167*  BUN 44* 52*  CREATININE 5.16* 5.72*  CALCIUM 9.3 9.3  PHOS 4.3  --    Liver Function Tests  Recent Labs  07/19/15 2025  ALBUMIN 2.9*   No results for input(s): LIPASE, AMYLASE in the last 72 hours. Cardiac Enzymes  Recent Labs  07/18/15 1603  TROPONINI 0.64*   TELE  Sinus rhythm at rate of 60s.   Radiology/Studies  Dg Chest 2 View  07/18/2015   CLINICAL DATA:  Cough, chest pain and shortness of breath tonight. Recent pacemaker placement.  EXAM: CHEST  2 VIEW  COMPARISON:  07/12/2015  FINDINGS: Dual  lead right-sided pacemaker, leads intact. No pneumothorax. Cardiomegaly is unchanged. Increased perivascular haziness suggestive of pulmonary edema. Probable bilateral pleural effusions with blunting of the costophrenic angles and associated bibasilar atelectasis. No acute osseous abnormalities are seen.  IMPRESSION: Small pleural effusions and perivascular haziness suggestive of pulmonary edema. Recommend correlation for CHF.   Electronically Signed   By: Jeb Levering M.D.   On: 07/18/2015 02:08   Dg Chest 2 View  07/12/2015   CLINICAL DATA:  Status post pacemaker placement, low right shoulder pain  EXAM: CHEST - 2 VIEW  COMPARISON:  04/27/2015  FINDINGS: Cardiac shadow is stable. Lungs are well aerated bilaterally. No focal infiltrate or sizable effusion is noted. Mild central vascular congestion is noted without interstitial edema. A pacing device is now  seen. No pneumothorax is noted. No bony abnormality is seen.  IMPRESSION: Mild vascular congestion.  Status post pacemaker without pneumothorax.   Electronically Signed   By: Inez Catalina M.D.   On: 07/12/2015 07:41    ASSESSMENT AND PLAN    1. Mild acute pericarditis, . No longer complains of pain. Trivial effusion. Continue conservative management  2. Acute on chronic diastolic heart failure - combination of progression to severe aortic stenosis and ESRD. HD for additional volume removal today. Had HD yesterday and possibly again Monday.   3. Severe aortic stenosis, now symptomatic. Dr. Burt Knack started evaluation for TAVR. Not sure about TAVR results in ESRD, but may be his only option.  4. Moderate anemia - Hgb was 9 yesterday. No CBC today.  Recheck iron status? (OK in February, may have been rechecked at dialysis center)  5. Dual chamber permanent pacemaker, recently placed. Normal lead parameters and good wound healing. Programmed VVIR backup currently due to concern re demand ischemia.  6. CAD with RCA-CTO and collateral filling - Continue ASA and plavix.   Jarrett Soho PA-C Pager [phone]  Patient seen, examined. Available data reviewed. Agree with findings, assessment, and plan as outlined by [name], PA-C. Exam reveals a pleasant elderly male in NAD. Lungs CTA, heart RRR with a 3/6 harsh laste peaking systolic murmur at the RUSB, mild peripheral edema. He is clinically better after hemodialysis. CTA reviewed and official interpretation is pending. By my review, he does appear to have adequate bilateral access if TAVR is pursued. Cardiac CTA just completed and interpretation pending. I have recommended coronary angiography as he has known moderate CAD and has had episodes of chest discomfort over past month. After studies completed he will undergo cardiac surgical consultation with either Dr Roxy Manns or Dr Cyndia Bent.   I have reviewed the risks, indications, and  alternatives to cardiac catheterization and possible PCI with the patient. Risks include but are not limited to bleeding, infection, vascular injury, stroke, myocardial infection, arrhythmia, kidney injury, radiation-related injury in the case of prolonged fluoroscopy use, emergency cardiac surgery, and death. The patient understands the risks of serious complication is low ([id number]).   [name], M.D. 07/21/2015 2:17 PM

## 2015-07-21 NOTE — Consult Note (Signed)
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  Patient states that he has been told that he had a heart murmur for many years. He was followed for several years by Dr. Bridgette Habermann in [place] for hypertension and atrial fibrillatioin.  He remained reasonably active physically until approximately 1 year ago. He developed progressive chronic kidney disease and was followed by Dr. Mercy Moore. In November 2015 the patient was admitted to the hospital acutely with a prolonged episode of substernal chest pain and shortness of breath associated with elevated Troponin levels. His hypertension was under poor control and creatinine had risen to 5.1. He initially had sinus rhythm with right bundle branch block but he developed complete heart block with addition of beta blocker therapy.  Transthoracic echocardiogram performed at that time revealed normal left ventricular systolic function with ejection fraction estimated 65%. There was severe aortic stenosis with peak velocity across the  aortic valve measured 4.4 m/s corresponding to a mean transvalvular gradient estimated 41 mmHg.  Cardiac catheterization performed at that time demonstrated multivessel coronary artery disease with 50-60% stenosis of the proximal left anterior descending coronary artery and 100% chronic occlusion of the mid right coronary artery with left-to-right collaterals. Mean transvalvular gradient measured at catheterization was reported 43 mmHg. The reported valve area was calculated 1.72 cm. There was mild pulmonary hypertension. The patient's heart block improved with holding beta blocker therapy. The patient was treated medically.  Since that time the patient has continued to gradually deteriorate. He underwent placement of a left upper arm AV fistula and eventually began hemodialysis in March of this year.  He began to experience episodes of symptomatic bradycardia during dialysis associated with progressive shortness of breath and chest pain.  He eventually underwent dual-chamber permanent pacemaker placement on 07/11/2015.  He was readmitted to the hospital 07/18/2015 with recurrent substernal chest pain and shortness of breath at rest associated with anemia and elevated troponin levels.  Follow-up transthoracic echocardiogram confirms the presence of severe aortic stenosis with peak velocity across the aortic valve measured as high as 4.6 m/s corresponding to mean transvalvular gradient estimated 45 mmHg. Left ventricular ejection fraction was reported 55-60%.  The patient was referred to Dr. Burt Knack and the multidisciplinary heart valve team for management, and follow-up diagnostic cardiac catheterization has been planned for tomorrow. Cardiothoracic surgical consultation was requested.  The patient is married and lives with his wife and son in [place].  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 He has remained reasonably active physically until the past year.  Over the past year the patient has experienced progressive decline of health primarily related to his cardiac disease and chronic kidney disease.  He states that he experiences exertional chest tightness and shortness of breath with very mild activity and occasionally at rest. He has had periods of chest tightness and shortness of breath during dialysis treatments. He sometimes wakes from his sleep feeling as though he smothering and has to sit up. He has had intermittent dizzy spells without syncope. His mobility is somewhat limited because of chronic pain in his left knee. He walks using a cane.  He has had a chronically poor appetite and reports having lost more than 50 pounds over the past 6 months.    Past Medical History  Diagnosis Date  . Hyperlipidemia   . Hypertension   . Aortic stenosis     a. severe by echo 09/2014  . Atrial fibrillation     a. not well documented, not on anticoagulation  . Coronary artery disease     a. chronically occluded RCA per cath 09/2014 with collaterals  . Peripheral vascular disease   . Claustrophobia   . Complete heart block   . Presence of permanent cardiac pacemaker   . Anginal pain   . Myocardial infarction 10/2014  . Pneumonia   . Type II diabetes mellitus   . Iron deficiency anemia   . History of blood transfusion     "related to gallbladder OR"  . History of stomach ulcers   . CVA (cerebral vascular accident) 10/2014    denies residual on 07/11/2015  . ESRD (end stage renal disease) on dialysis     a. on dialysis; [facility]; MWF, LUE fistula (07/11/2015)  . CHF (congestive heart failure)   . Renal insufficiency     Past Surgical History  Procedure Laterality Date  . Esophagogastroduodenoscopy  08/01/2012    Procedure: ESOPHAGOGASTRODUODENOSCOPY (EGD);  Surgeon: [name], MD;  Location: [facility];  Service: Endoscopy;  Laterality: N/A;  . Tonsillectomy    . Colonoscopy w/ biopsies and polypectomy    . Av fistula placement  Left 10/19/2014    Procedure: BRACHIOCEPHALIC ARTERIOVENOUS (AV) FISTULA CREATION ;  Surgeon: Conrad Johnson Lane, MD;  Location: [facility];  Service: Vascular;  Laterality: Left;  . Left and right heart catheterization with coronary angiogram N/A 09/30/2014    Procedure: LEFT AND RIGHT HEART CATHETERIZATION WITH CORONARY ANGIOGRAM;  Surgeon: [name], MD;  Location: [facility] LAB;  Service: Cardiovascular;  Laterality: N/A;  . Insertion of dialysis catheter Right 02/02/2015    Procedure: INSERTION OF DIALYSIS CATHETER  RIGHT INTERNAL JUGULAR;  Surgeon: Mal [name], MD;  Location: [facility];  Service: Vascular;  Laterality: Right;  . Cardiac catheterization    . Insert / replace / remove pacemaker  07/11/2015  . Cholecystectomy open  1980's  . Cataract extraction w/ intraocular lens  implant, bilateral Bilateral 1990's  . Coronary angioplasty    . Ep implantable device N/A 07/11/2015    Procedure: Pacemaker Implant;  Surgeon: [name], MD;  Location: [facility] LAB;  Service: Cardiovascular;  Laterality: N/A;    Family History  Problem Relation Age of Onset  . Diabetes Father   . Heart disease Father   . Diabetes Sister     Social History   Social History  . Marital Status: Married    Spouse Name: N/A  . Number of Children: N/A  . Years of Education: N/A   Occupational History  .  Not on file.   Social History Main Topics  . Smoking status: Former Smoker -- 2.00 packs/day for 32 years    Quit date: 11/27/1983  . Smokeless tobacco: Never Used  . Alcohol Use: Yes     Comment: 07/11/2015 "stopped drinking in 09/2014"  . Drug Use: No  . Sexual Activity: Not on file   Other Topics Concern  . Not on file   Social History Narrative    Prior to Admission medications   Medication Sig Start Date End Date Taking? Authorizing Provider  amLODipine (NORVASC) 10 MG tablet Take 10 mg by mouth daily.     Yes Historical Provider, MD  aspirin 81 MG chewable tablet Chew 81 mg by mouth  daily.   Yes Historical Provider, MD  calcium carbonate (TUMS - DOSED IN MG ELEMENTAL CALCIUM) 500 MG chewable tablet Chew 4 tablets by mouth 3 (three) times daily before meals.    Yes Historical Provider, MD  hydrALAZINE (APRESOLINE) 25 MG tablet Take 25 mg by mouth 2 (two) times daily. 05/03/15  Yes Historical Provider, MD  Insulin Detemir (LEVEMIR FLEXTOUCH) 100 UNIT/ML Pen Inject 35 Units into the skin at bedtime. 05/03/15  Yes Historical Provider, MD  lidocaine-prilocaine (EMLA) cream Apply 1 application topically 3 (three) times a week. Apply 1 application onto the left arm as directed 06/21/15  Yes Historical Provider, MD  multivitamin (RENA-VIT) TABS tablet Take 1 tablet by mouth daily.   Yes Historical Provider, MD  nitroGLYCERIN (NITROSTAT) 0.4 MG SL tablet Place 0.4 mg under the tongue every 5 (five) minutes as needed for chest pain.  10/16/13  Yes Historical Provider, MD  simvastatin (ZOCOR) 20 MG tablet Take 1 tablet (20 mg total) by mouth at bedtime. 10/02/14  Yes Theodis Blaze, MD    Current Facility-Administered Medications  Medication Dose Route Frequency Provider Last Rate Last Dose  . 0.9 %  sodium chloride infusion  250 mL Intravenous PRN Theressa Millard, MD      . 0.9 %  sodium chloride infusion  100 mL Intravenous PRN Roney Jaffe, MD      . 0.9 %  sodium chloride infusion  100 mL Intravenous PRN Roney Jaffe, MD      . acetaminophen (TYLENOL) tablet 650 mg  650 mg Oral Q6H PRN Theressa Millard, MD       Or  . acetaminophen (TYLENOL) suppository 650 mg  650 mg Rectal Q6H PRN Theressa Millard, MD      . albuterol (PROVENTIL) (2.5 MG/3ML) 0.083% nebulizer solution 2.5 mg  2.5 mg Nebulization Q4H PRN Kinnie Feil, MD   2.5 mg at 07/18/15 2354  . alteplase (CATHFLO ACTIVASE) injection 2 mg  2 mg Intracatheter Once PRN Roney Jaffe, MD      . antiseptic oral rinse (CPC / CETYLPYRIDINIUM CHLORIDE 0.05%) solution 7 mL  7 mL Mouth Rinse BID Theressa Millard, MD   7 mL  at 07/21/15 1120  . aspirin tablet 325 mg  325 mg Oral Daily Theressa Millard, MD   325 mg at 07/21/15 1120  . calcium carbonate (TUMS - dosed in mg elemental calcium) chewable tablet 200 mg of elemental calcium  1 tablet Oral TID WC Ernest Haber, PA-C      . clopidogrel (PLAVIX) tablet 75 mg  75 mg Oral Daily Theressa Millard, MD   75 mg at 07/21/15 1120  . Darbepoetin Alfa (ARANESP) injection 100 mcg  100 mcg Intravenous Q Mon-HD Fleet Contras, MD  100 mcg at 07/18/15 1051  . feeding supplement (NEPRO CARB STEADY) liquid 237 mL  237 mL Oral PRN Roney Jaffe, MD      . heparin injection 1,000 Units  1,000 Units Dialysis PRN Roney Jaffe, MD      . heparin injection 5,000 Units  5,000 Units Subcutaneous 3 times per day Theressa Millard, MD   5,000 Units at 07/21/15 1421  . HYDROcodone-homatropine (HYCODAN) 5-1.5 MG/5ML syrup 5 mL  5 mL Oral Q4H PRN Mihai Croitoru, MD      . HYDROmorphone (DILAUDID) injection 0.5-1 mg  0.5-1 mg Intravenous Q3H PRN Theressa Millard, MD      . insulin aspart (novoLOG) injection 0-9 Units  0-9 Units Subcutaneous TID WC Kinnie Feil, MD   1 Units at 07/21/15 [phone]  . lidocaine (PF) (XYLOCAINE) 1 % injection 5 mL  5 mL Intradermal PRN Roney Jaffe, MD      . lidocaine-prilocaine (EMLA) cream 1 application  1 application Topical PRN Roney Jaffe, MD      . metoprolol tartrate (LOPRESSOR) tablet 12.5 mg  12.5 mg Oral BID Theressa Millard, MD   Stopped at 07/20/15 2200  . midodrine (PROAMATINE) tablet 10 mg  10 mg Oral TID WC Thurnell Lose, MD   10 mg at 07/21/15 1119  . multivitamin (RENA-VIT) tablet 1 tablet  1 tablet Oral QHS Fleet Contras, MD   1 tablet at 07/20/15 2136  . nitroGLYCERIN (NITROGLYN) 2 % ointment 1 inch  1 inch Topical 4 times per day Theressa Millard, MD   1 inch at 07/21/15 1133  . nitroGLYCERIN (NITROSTAT) SL tablet 0.4 mg  0.4 mg Sublingual Q5 min PRN Everlene Balls, MD      . ondansetron (ZOFRAN) tablet 4 mg  4 mg  Oral Q6H PRN Theressa Millard, MD       Or  . ondansetron (ZOFRAN) injection 4 mg  4 mg Intravenous Q6H PRN Theressa Millard, MD      . pentafluoroprop-tetrafluoroeth (GEBAUERS) aerosol 1 application  1 application Topical PRN Roney Jaffe, MD      . sodium chloride 0.9 % injection 3 mL  3 mL Intravenous Q12H Theressa Millard, MD   3 mL at 07/21/15 1122  . sodium chloride 0.9 % injection 3 mL  3 mL Intravenous PRN Theressa Millard, MD        Allergies  Allergen Reactions  . Byetta 10 Mcg Pen [Exenatide] Diarrhea and Nausea And Vomiting      Review of Systems:   General:  normal appetite, decreased energy, no weight gain, + significant weight loss, no fever  Cardiac:  + chest pain with exertion, + chest pain at rest, + SOB with exertion, + resting SOB, + PND, no orthopnea, no palpitations, + arrhythmia, no atrial fibrillation, mild LE edema, + dizzy spells, no syncope  Respiratory:  + shortness of breath, no home oxygen, + productive cough, + dry cough, no bronchitis, no wheezing, no hemoptysis, no asthma, no pain with inspiration or cough, no sleep apnea, no CPAP at night  GI:   no difficulty swallowing, no reflux, no frequent heartburn, no hiatal hernia, no abdominal pain, no constipation, no diarrhea, no hematochezia, no hematemesis, no melena  GU:   + makes a small amount of urine daily, no dysuria,  no frequency, no urinary tract infection, no hematuria, no enlarged prostate, no kidney stones, + kidney disease, on dialysis MWF at [facility] via  left upper arm AV fistula  Vascular:  no pain suggestive of claudication, no pain in feet, no leg cramps, no varicose veins, no DVT, no non-healing foot ulcer  Neuro:   + stroke, no TIA's, no seizures, no headaches, no temporary blindness one eye,  no slurred speech, no peripheral neuropathy, + chronic pain, mild instability of gait, no memory/cognitive dysfunction  Musculoskeletal: + arthritis - primarily involving  the left knee, + joint swelling, no myalgias, mild difficulty walking, diminished mobility   Skin:   no rash, no itching, no skin infections, no pressure sores or ulcerations  Psych:   no anxiety, no depression, no nervousness, no unusual recent stress  Eyes:   + blurry vision, no floaters, no recent vision changes, does not wear glasses or contacts  ENT:   no hearing loss, edentulous, no dentures, last saw dentist many years ago  Hematologic:  no easy bruising, no abnormal bleeding, no clotting disorder, no frequent epistaxis  Endocrine:  + diabetes,  checks CBG's at home     Physical Exam:   BP 116/60 mmHg  Pulse 59  Temp(Src) 97.1 F (36.2 C) (Oral)  Resp 18  Ht 5\' 11"  (1.803 m)  Wt 93.9 kg (207 lb 0.2 oz)  BMI 28.89 kg/m2  SpO2 98%  General:  Chronically ill-appearing  HEENT:  Unremarkable   Neck:   no JVD, no bruits, no adenopathy   Chest:   Few bibasilar crackles, o/w clear to auscultation, symmetrical breath sounds, no wheezes, no rhonchi   CV:   RRR, grade IV/VI crescendo/decrescendo systolic murmur best LSB  Abdomen:  soft, non-tender, no masses   Extremities:  warm, well-perfused, pulses diminished, mild lower extremity edema, functioning AV fistula L upper arm  Rectal/GU  Deferred  Neuro:   Grossly non-focal and symmetrical throughout  Skin:   Clean and dry, no rashes, no breakdown  Diagnostic Tests:  Transthoracic Echocardiography  Patient:  Jeffrey [name], Jeffrey [name] MR #:    [id number] Study Date: 09/29/2014 Gender:   M Age:    73 Height:   180.3 cm Weight:   108.4 kg BSA:    2.36 m^2 Pt. Status: Room:    [place]  ATTENDING  Thompson, Reddick, RDCS ADMITTING  Ivor Costa [address], Soledad Gerlach [address] [place] PERFORMING  [facility], Inpatient  cc:  ------------------------------------------------------------------- LV EF: 65% -   70%  ------------------------------------------------------------------- Indications:   Chest pain 786.51.  ------------------------------------------------------------------- History:  PMH:  Coronary artery disease. Stroke. Risk factors: Former tobacco use. Hypertension. Diabetes mellitus.  ------------------------------------------------------------------- Study Conclusions  - Left ventricle: The cavity size was normal. There was severe concentric hypertrophy. Systolic function was vigorous. The estimated ejection fraction was in the range of 65% to 70%. Wall motion was normal; there were no regional wall motion abnormalities. - Aortic valve: Cusp separation was reduced. There was severe stenosis. Peak velocity is 4.59m/s. Mean gradient (S): 41 mm Hg. - Left atrium: The atrium was mildly dilated. - Right ventricle: The cavity size was mildly dilated. Wall thickness was normal. - Right atrium: The atrium was mildly dilated.  Transthoracic echocardiography. M-mode, complete 2D, spectral Doppler, and color Doppler. Birthdate: Patient birthdate: 04-Jul-1941. Age: Patient is 74 yr old. Sex: Gender: male. BMI: 33.3 kg/m^2. Blood pressure:   145/30 Patient status: Inpatient. Study date: Study date: 09/29/2014. Study time: 03:17 PM. Location: ICU/CCU  -------------------------------------------------------------------  ------------------------------------------------------------------- Left ventricle: The cavity size was normal. There was severe concentric hypertrophy. Systolic function  was vigorous. The estimated ejection fraction was in the range of 65% to 70%. Wall motion was normal; there were no regional wall motion abnormalities.  ------------------------------------------------------------------- Aortic valve:  Trileaflet; moderately thickened, moderately calcified leaflets. Cusp separation was reduced. Doppler:  There was severe  stenosis.  There was no regurgitation.  VTI ratio of LVOT to aortic valve: 0.36. Valve area (VTI): 1.76 cm^2. Indexed valve area (VTI): 0.74 cm^2/m^2. Valve area (Vmax): 1.64 cm^2. Indexed valve area (Vmax): 0.69 cm^2/m^2. Mean velocity ratio of LVOT to aortic valve: 0.32. Valve area (Vmean): 1.56 cm^2. Indexed valve area (Vmean): 0.66 cm^2/m^2.  Mean gradient (S): 41 mm Hg. Peak gradient (S): 77 mm Hg.  ------------------------------------------------------------------- Aorta: Aortic root: The aortic root was normal in size.  ------------------------------------------------------------------- Mitral valve:  Structurally normal valve.  Mobility was not restricted. Doppler: Transvalvular velocity was within the normal range. There was no evidence for stenosis. There was no regurgitation.  Peak gradient (D): 7 mm Hg.  ------------------------------------------------------------------- Left atrium: The atrium was mildly dilated.  ------------------------------------------------------------------- Right ventricle: The cavity size was mildly dilated. Wall thickness was normal. Systolic function was normal.  ------------------------------------------------------------------- Pulmonic valve:  Structurally normal valve.  Cusp separation was normal. Doppler: Transvalvular velocity was within the normal range. There was no evidence for stenosis. There was trivial regurgitation.  ------------------------------------------------------------------- Tricuspid valve:  Structurally normal valve.  Doppler: Transvalvular velocity was within the normal range. There was no regurgitation.  ------------------------------------------------------------------- Pulmonary artery:  The main pulmonary artery was normal-sized. Systolic pressure was within the normal range.  ------------------------------------------------------------------- Right atrium: The atrium was mildly  dilated.  ------------------------------------------------------------------- Pericardium: There was no pericardial effusion.  ------------------------------------------------------------------- Systemic veins: Inferior vena cava: The vessel was dilated. The respirophasic diameter changes were blunted (< 50%), consistent with elevated central venous pressure.  ------------------------------------------------------------------- Measurements  Left ventricle              Value     Reference LV ID, ED, PLAX chordal          47.6 mm    43 - 52 LV ID, ES, PLAX chordal          28.6 mm    23 - 38 LV fx shortening, PLAX chordal      40  %    >=29 LV PW thickness, ED            17.1 mm    --------- IVS/LV PW ratio, ED            1.01      <=1.3 Stroke volume, 2D             191  ml    --------- Stroke volume/bsa, 2D           81  ml/m^2  --------- LV e&', lateral              14.7 cm/s   --------- LV E/e&', lateral             9.18      --------- LV e&', medial               7.83 cm/s   --------- LV E/e&', medial              17.24     --------- LV e&', average              11.27 cm/s   --------- LV E/e&', average             11.98     ---------  Ventricular septum            Value     Reference IVS thickness, ED             17.2 mm    ---------  LVOT                   Value     Reference LVOT ID, S                25  mm    --------- LVOT area                 4.91 cm^2   --------- LVOT peak velocity, S           147  cm/s   --------- LVOT mean velocity, S           93.7 cm/s   --------- LVOT VTI, S                 39  cm    --------- LVOT peak gradient, S           9   mm Hg  ---------  Aortic valve               Value     Reference Aortic valve mean velocity, S       295  cm/s   --------- Aortic valve VTI, S            109  cm    --------- Aortic mean gradient, S          41  mm Hg  --------- Aortic peak gradient, S          77  mm Hg  --------- VTI ratio, LVOT/AV            0.36      --------- Aortic valve area, VTI          1.76 cm^2   --------- Aortic valve area/bsa, VTI        0.74 cm^2/m^2 --------- Aortic valve area, peak velocity     1.64 cm^2   --------- Aortic valve area/bsa, peak        0.69 cm^2/m^2 --------- velocity Velocity ratio, mean, LVOT/AV       0.32      --------- Aortic valve area, mean velocity     1.56 cm^2   --------- Aortic valve area/bsa, mean        0.66 cm^2/m^2 --------- velocity  Aorta                   Value     Reference Aortic root ID, ED            37  mm    ---------  Left atrium                Value     Reference LA ID, A-P, ES              46  mm    --------- LA ID/bsa, A-P              1.95 cm/m^2  <=2.2 LA volume, S               138  ml    --------- LA volume/bsa, S             58.4 ml/m^2  --------- LA volume, ES, 1-p A4C  105  ml    --------- LA volume/bsa, ES, 1-p A4C        44.4 ml/m^2  --------- LA volume, ES, 1-p A2C          184  ml    --------- LA volume/bsa, ES, 1-p A2C        77.8 ml/m^2  ---------  Mitral valve               Value     Reference Mitral E-wave peak velocity        135  cm/s   --------- Mitral deceleration time          204  ms    150 - 230 Mitral peak gradient, D          7   mm Hg  ---------  Legend: (L) and (H) mark values outside specified reference range.  ------------------------------------------------------------------- Prepared and Electronically Authenticated by  Candee Furbish, M.D. 2015-11-04T17:40:49   Transthoracic Echocardiography  Patient:  Jeffrey [name], Jeffrey [name] MR #:    [id number] Study Date: 07/18/2015 Gender:   M Age:    73 Height:   180.3 cm Weight:   97.1 kg BSA:    2.23 m^2 Pt. Status: Room:    2H16C  ADMITTING  Arnoldo Morale, Harvette C ATTENDING  Arnoldo Morale, Harvette C SONOGRAPHER Marygrace Drought, RCS ORDERING   [place], [place] REFERRING  [place], [place] PERFORMING  [facility], Outpatient  cc:  ------------------------------------------------------------------- LV EF: 55% -  60%  ------------------------------------------------------------------- Indications:   424.1 Aortic valve disorders.  ------------------------------------------------------------------- History:  PMH:  Chest pain. Coronary artery disease. Risk factors: Hypertension. Diabetes mellitus.  ------------------------------------------------------------------- Study Conclusions  - Left ventricle: LVEF is approximately 50% with distal inferior and apical hypokinesis. The cavity size was normal. Wall thickness was normal. Systolic function was normal. The estimated ejection fraction was in the range of 55% to 60%. - Aortic valve: AV is thickened, calcified with restricted motion Peak and mean gradiehts through the valve are 75 and 45 mm Hg respectively conssitent with severe AS. - Mitral valve: There was mild regurgitation. - Left atrium: The atrium was severely dilated. - Right ventricle: The cavity size was mildly dilated. Wall thickness was normal. - Right atrium: The atrium was moderately dilated. - Pulmonary  arteries: PA peak pressure: 32 mm Hg (S). - Pericardium, extracardiac: A trivial pericardial effusion was identified.  Transthoracic echocardiography. M-mode, complete 2D, spectral Doppler, and color Doppler. Birthdate: Patient birthdate: [birth year]. Age: Patient is 74 yr old. Sex: Gender: male. BMI: 29.8 kg/m^2. Blood pressure:   123/63 Patient status: Inpatient. Study date: Study date: 07/18/2015. Study time: 02:46 PM. Location: Echo laboratory.  -------------------------------------------------------------------  ------------------------------------------------------------------- Left ventricle: LVEF is approximately 50% with distal inferior and apical hypokinesis. The cavity size was normal. Wall thickness was normal. Systolic function was normal. The estimated ejection fraction was in the range of 55% to 60%.  ------------------------------------------------------------------- Aortic valve: AV is thickened, calcified with restricted motion Peak and mean gradiehts through the valve are 75 and 45 mm Hg respectively conssitent with severe AS. Doppler:   VTI ratio of LVOT to aortic valve: 0.23. Valve area (VTI): 0.74 cm^2. Indexed valve area (VTI): 0.33 cm^2/m^2. Peak velocity ratio of LVOT to aortic valve: 0.21. Valve area (Vmax): 0.66 cm^2. Indexed valve area (Vmax): 0.3 cm^2/m^2. Mean velocity ratio of LVOT to aortic valve: 0.24. Valve area (Vmean): 0.74 cm^2. Indexed valve area (Vmean): 0.33 cm^2/m^2.  Mean gradient (S): 43 mm Hg. Peak gradient (S): 84 mm Hg.  -------------------------------------------------------------------  Mitral valve:  Calcified annulus. Mildly thickened leaflets . Doppler: There was mild regurgitation.  Peak gradient (D): 8 mm Hg.  ------------------------------------------------------------------- Left atrium: The atrium was severely dilated.  ------------------------------------------------------------------- Right  ventricle: The cavity size was mildly dilated. Wall thickness was normal. Pacer wire or catheter noted in right ventricle. Systolic function was normal.  ------------------------------------------------------------------- Pulmonic valve:  Structurally normal valve.  Cusp separation was normal. Doppler: Transvalvular velocity was within the normal range. There was mild regurgitation.  ------------------------------------------------------------------- Tricuspid valve:  Structurally normal valve.  Leaflet separation was normal. Doppler: Transvalvular velocity was within the normal range. There was mild regurgitation.  ------------------------------------------------------------------- Right atrium: The atrium was moderately dilated. Pacer wire or catheter noted in right atrium.  ------------------------------------------------------------------- Pericardium: A trivial pericardial effusion was identified.  ------------------------------------------------------------------- Systemic veins: Inferior vena cava: The vessel was dilated. The respirophasic diameter changes were blunted (< 50%), consistent with elevated central venous pressure. Diameter: 21 mm.  ------------------------------------------------------------------- Measurements  IVC                    Value     Reference ID                    21  mm    ---------  Left ventricle              Value     Reference LV ID, ED, PLAX chordal      (H)   55.3 mm    43 - 52 LV ID, ES, PLAX chordal          37.3 mm    23 - 38 LV fx shortening, PLAX chordal      33  %    >=29 LV PW thickness, ED            11.7 mm    --------- IVS/LV PW ratio, ED            1.05      <=1.3 Stroke volume, 2D             77  ml    --------- Stroke volume/bsa, 2D            35  ml/m^2  --------- LV ejection fraction, 1-p A4C       64  %    --------- LV e&', lateral              12.4 cm/s   --------- LV E/e&', lateral             11.13     --------- LV e&', medial               7.44 cm/s   --------- LV E/e&', medial              18.55     --------- LV e&', average              9.92 cm/s   --------- LV E/e&', average             13.91     ---------  Ventricular septum            Value     Reference IVS thickness, ED             12.3 mm    ---------  LVOT                   Value     Reference LVOT ID, S  20  mm    --------- LVOT area                 3.14 cm^2   --------- LVOT peak velocity, S           95.9 cm/s   --------- LVOT mean velocity, S           71.5 cm/s   --------- LVOT VTI, S                24.6 cm    --------- LVOT peak gradient, S           4   mm Hg  ---------  Aortic valve               Value     Reference Aortic valve peak velocity, S       458  cm/s   --------- Aortic valve mean velocity, S       303  cm/s   --------- Aortic valve VTI, S            105  cm    --------- Aortic mean gradient, S          43  mm Hg  --------- Aortic peak gradient, S          84  mm Hg  --------- VTI ratio, LVOT/AV            0.23      --------- Aortic valve area, VTI          0.74 cm^2   --------- Aortic valve area/bsa, VTI        0.33 cm^2/m^2 --------- Velocity ratio, peak, LVOT/AV       0.21      --------- Aortic valve area, peak velocity     0.66 cm^2   --------- Aortic valve area/bsa, peak         0.3  cm^2/m^2 --------- velocity Velocity ratio, mean, LVOT/AV       0.24      --------- Aortic valve area, mean velocity     0.74 cm^2   --------- Aortic valve area/bsa, mean        0.33 cm^2/m^2 --------- velocity  Aorta                   Value     Reference Aortic root ID, ED            31  mm    ---------  Left atrium                Value     Reference LA ID, A-P, ES              55  mm    --------- LA ID/bsa, A-P          (H)   2.47 cm/m^2  <=2.2 LA volume, S               115  ml    --------- LA volume/bsa, S             51.6 ml/m^2  --------- LA volume, ES, 1-p A4C          116  ml    --------- LA volume/bsa, ES, 1-p A4C        52.1 ml/m^2  --------- LA volume, ES, 1-p A2C          108  ml    --------- LA volume/bsa, ES, 1-p A2C  48.5 ml/m^2  ---------  Mitral valve               Value     Reference Mitral E-wave peak velocity        138  cm/s   --------- Mitral deceleration time         162  ms    150 - 230 Mitral peak gradient, D          8   mm Hg  ---------  Pulmonary arteries            Value     Reference PA pressure, S, DP        (H)   32  mm Hg  <=30  Tricuspid valve              Value     Reference Tricuspid maximal inflow         254  cm/s   --------- velocity, PISA Tricuspid regurg peak velocity      254  cm/s   --------- Tricuspid peak RV-RA gradient       26  mm Hg  --------- Tricuspid maximal regurg         26  cm/s   --------- velocity, PISA  Systemic veins              Value     Reference Estimated CVP               3   mm Hg   ---------  Right ventricle              Value     Reference RV pressure, S, DP            29  mm Hg  <=30 RV s&', lateral, S             15.6 cm/s   ---------  Legend: (L) and (H) mark values outside specified reference range.  ------------------------------------------------------------------- Prepared and Electronically Authenticated by  Dorris Carnes, M.D. 2016-08-22T20:31:52   STS Risk Calculator  Procedure    AVR + CABG  Risk of Mortality   23.7% Morbidity or Mortality  60.1% Prolonged LOS   48.8% Short LOS    3.0% Permanent Stroke   5.7% Prolonged Vent Support  51.7% DSW Infection    3.0% Renal Failure    n/a% Reoperation    21.6%    Impression:  Patient has stage D severe symptomatic aortic stenosis and multivessel coronary artery disease.  He presents with progressive symptoms of substernal chest tightness and shortness of breath consistent with unstable angina and chronic diastolic congestive heart failure, [place] Heart Association functional class IV.  I have personally reviewed the patient's two most recent transthoracic echocardiograms and the diagnostic cardiac catheterization performed in November 2015.  Echocardiograms clearly demonstrate the presence of severe aortic stenosis with severe calcification, thickening, and restricted leaflet mobility involving all 3 leaflets of the aortic valve. Peak velocity across the aortic valve was measured well above 4 m/s corresponding to mean transvalvular gradients greater than 40 mmHg on both studies.  Left ventricular systolic function appears reasonably well preserved, although the ejection fraction has dropped slightly since last November. Diagnostic cardiac catheterization performed last November demonstrated multivessel coronary artery disease with severely calcified moderate stenosis of the proximal left anterior descending coronary artery and 100% chronic occlusion  of the right coronary artery.  Follow-up catheterization has been planned for tomorrow.  There is no question that risks associated  with conventional surgical aortic valve replacement and coronary artery bypass grafting would be extremely high in this elderly gentleman with numerous comorbid medical problems.    Plan:  I have discussed the nature of the patient's multiple problems with the patient and his wife at the bedside this evening. We await results from diagnostic cardiac catheterization planned for tomorrow. Will continue to follow and discuss treatment options at length with Dr. Burt Knack and other members of the multidisciplinary heart valve team.   I spent in excess of 120 minutes during the conduct of this hospital consultation and >50% of this time involved direct face-to-face encounter for counseling and/or coordination of the patient's care.   Valentina Gu. Roxy Manns, MD 07/21/2015 6:09 PM

## 2015-07-22 ENCOUNTER — Encounter (HOSPITAL_COMMUNITY)
Admission: EM | Disposition: A | Discharge status: Home / Self Care | Payer: Medicare Other | Source: Home / Self Care | Attending: Internal Medicine

## 2015-07-22 DIAGNOSIS — I350 Nonrheumatic aortic (valve) stenosis: Secondary | ICD-10-CM

## 2015-07-22 DIAGNOSIS — I2510 Atherosclerotic heart disease of native coronary artery without angina pectoris: Secondary | ICD-10-CM

## 2015-07-22 DIAGNOSIS — E8770 Fluid overload, unspecified: Secondary | ICD-10-CM | POA: Insufficient documentation

## 2015-07-22 HISTORY — PX: CARDIAC CATHETERIZATION: SHX172

## 2015-07-22 LAB — BASIC METABOLIC PANEL
Anion gap: 11 (ref 5–15)
BUN: 33 mg/dL — AB (ref 6–20)
CHLORIDE: 95 mmol/L — AB (ref 101–111)
CO2: 28 mmol/L (ref 22–32)
Calcium: 8.6 mg/dL — ABNORMAL LOW (ref 8.9–10.3)
Creatinine, Ser: 4.23 mg/dL — ABNORMAL HIGH (ref 0.61–1.24)
GFR calc Af Amer: 14 mL/min — ABNORMAL LOW (ref >60)
GFR, EST NON AFRICAN AMERICAN: 12 mL/min — AB (ref >60)
GLUCOSE: 154 mg/dL — AB (ref 65–99)
POTASSIUM: 4 mmol/L (ref 3.5–5.1)
Sodium: 134 mmol/L — ABNORMAL LOW (ref 135–145)

## 2015-07-22 LAB — POCT ACTIVATED CLOTTING TIME: Activated Clotting Time: 116 seconds

## 2015-07-22 LAB — POCT I-STAT 3, VENOUS BLOOD GAS (G3P V)
Acid-Base Excess: 4 mmol/L — ABNORMAL HIGH (ref 0.0–2.0)
Bicarbonate: 28.2 mEq/L — ABNORMAL HIGH (ref 20.0–24.0)
O2 SAT: 60 %
PH VEN: 7.435 — AB (ref 7.250–7.300)
TCO2: 30 mmol/L (ref 0–100)
pCO2, Ven: 42.1 mmHg — ABNORMAL LOW (ref 45.0–50.0)
pO2, Ven: 31 mmHg (ref 30.0–45.0)

## 2015-07-22 LAB — PROTIME-INR
INR: 1.28 (ref 0.00–1.49)
Prothrombin Time: 16.2 seconds — ABNORMAL HIGH (ref 11.6–15.2)

## 2015-07-22 LAB — GLUCOSE, CAPILLARY
Glucose-Capillary: 128 mg/dL — ABNORMAL HIGH (ref 65–99)
Glucose-Capillary: 128 mg/dL — ABNORMAL HIGH (ref 65–99)
Glucose-Capillary: 117 mg/dL — ABNORMAL HIGH (ref 65–99)
Glucose-Capillary: 207 mg/dL — ABNORMAL HIGH (ref 65–99)

## 2015-07-22 LAB — POCT I-STAT 3, ART BLOOD GAS (G3+)
Acid-Base Excess: 2 mmol/L (ref 0.0–2.0)
Bicarbonate: 26.4 mEq/L — ABNORMAL HIGH (ref 20.0–24.0)
O2 SAT: 92 %
PCO2 ART: 40.1 mmHg (ref 35.0–45.0)
PO2 ART: 62 mmHg — AB (ref 80.0–100.0)
TCO2: 28 mmol/L (ref 0–100)
pH, Arterial: 7.427 (ref 7.350–7.450)

## 2015-07-22 LAB — CBC
HCT: 25.3 % — ABNORMAL LOW (ref 39.0–52.0)
Hemoglobin: 8.4 g/dL — ABNORMAL LOW (ref 13.0–17.0)
MCH: 31.1 pg (ref 26.0–34.0)
MCHC: 33.2 g/dL (ref 30.0–36.0)
MCV: 93.7 fL (ref 78.0–100.0)
PLATELETS: 345 10*3/uL (ref 150–400)
RBC: 2.7 MIL/uL — AB (ref 4.22–5.81)
RDW: 15.1 % (ref 11.5–15.5)
WBC: 10.6 10*3/uL — ABNORMAL HIGH (ref 4.0–10.5)

## 2015-07-22 LAB — RMSF, IGG, IFA

## 2015-07-22 LAB — ROCKY MTN SPOTTED FVR ABS PNL(IGG+IGM)
RMSF IGG: POSITIVE — AB
RMSF IGM: 0.31 {index} (ref 0.00–0.89)

## 2015-07-22 SURGERY — RIGHT/LEFT HEART CATH AND CORONARY ANGIOGRAPHY

## 2015-07-22 MED ORDER — SODIUM CHLORIDE 0.9 % IJ SOLN
3.0000 mL | Freq: Two times a day (BID) | INTRAMUSCULAR | Status: DC
Start: 2015-07-22 — End: 2015-07-26
  Administered 2015-07-22 – 2015-07-25 (×7): 3 mL via INTRAVENOUS

## 2015-07-22 MED ORDER — SODIUM CHLORIDE 0.9 % IV SOLN: 250.0000 mL | INTRAVENOUS | Status: DC | PRN

## 2015-07-22 MED ORDER — CALCIUM CARBONATE ANTACID 500 MG PO CHEW
3.0000 | CHEWABLE_TABLET | Freq: Three times a day (TID) | ORAL | Status: DC
  Administered 2015-07-22 – 2015-08-03 (×15): 600 mg via ORAL
  Filled 2015-07-22 (×27): qty 3

## 2015-07-22 MED ORDER — FENTANYL CITRATE (PF) 100 MCG/2ML IJ SOLN
INTRAMUSCULAR | Status: AC
  Filled 2015-07-22: qty 4

## 2015-07-22 MED ORDER — SODIUM CHLORIDE 0.9 % IV SOLN
62.5000 mg | INTRAVENOUS | Status: DC
  Administered 2015-07-22 – 2015-08-05 (×4): 62.5 mg via INTRAVENOUS
  Filled 2015-07-22 (×6): qty 5

## 2015-07-22 MED ORDER — MIDODRINE HCL 5 MG PO TABS
ORAL_TABLET | ORAL | Status: AC
  Filled 2015-07-22: qty 2

## 2015-07-22 MED ORDER — MIDAZOLAM HCL 2 MG/2ML IJ SOLN
INTRAMUSCULAR | Status: AC
  Filled 2015-07-22: qty 4

## 2015-07-22 MED ORDER — SODIUM CHLORIDE 0.9 % IJ SOLN: 3.0000 mL | INTRAMUSCULAR | Status: DC | PRN

## 2015-07-22 MED ORDER — MIDAZOLAM HCL 2 MG/2ML IJ SOLN
INTRAMUSCULAR | Status: DC | PRN
  Administered 2015-07-22: 1 mg via INTRAVENOUS

## 2015-07-22 MED ORDER — SODIUM CHLORIDE 0.9 % IJ SOLN
3.0000 mL | Freq: Two times a day (BID) | INTRAMUSCULAR | Status: DC
  Administered 2015-07-22 – 2015-07-25 (×5): 3 mL via INTRAVENOUS

## 2015-07-22 MED ORDER — IOHEXOL 350 MG/ML SOLN
INTRAVENOUS | Status: DC | PRN
  Administered 2015-07-22: 80 mL via INTRA_ARTERIAL

## 2015-07-22 MED ORDER — LIDOCAINE HCL (PF) 1 % IJ SOLN
INTRAMUSCULAR | Status: AC
  Filled 2015-07-22: qty 30

## 2015-07-22 MED ORDER — HEPARIN (PORCINE) IN NACL 2-0.9 UNIT/ML-% IJ SOLN
INTRAMUSCULAR | Status: AC
  Filled 2015-07-22: qty 1500

## 2015-07-22 MED ORDER — LIDOCAINE HCL (PF) 1 % IJ SOLN
INTRAMUSCULAR | Status: DC | PRN
  Administered 2015-07-22: 14:00:00

## 2015-07-22 MED ORDER — NITROGLYCERIN 1 MG/10 ML FOR IR/CATH LAB
INTRA_ARTERIAL | Status: AC
Start: 2015-07-22 — End: 2015-07-22
  Filled 2015-07-22: qty 10

## 2015-07-22 MED ORDER — SODIUM CHLORIDE 0.9 % IV SOLN
INTRAVENOUS | Status: DC | PRN
  Administered 2015-07-22: 10 mL/h via INTRAVENOUS

## 2015-07-22 MED ORDER — FENTANYL CITRATE (PF) 100 MCG/2ML IJ SOLN
INTRAMUSCULAR | Status: DC | PRN
  Administered 2015-07-22: 25 ug via INTRAVENOUS

## 2015-07-22 SURGICAL SUPPLY — 18 items
CATH INFINITI 5FR ANG PIGTAIL (CATHETERS) ×3
CATH INFINITI 5FR JL5 (CATHETERS) ×3
CATH INFINITI 5FR MULTPACK ANG (CATHETERS) ×3
CATH SWAN GANZ 7F STRAIGHT (CATHETERS) ×3
CATHETER LAUNCHER 5F JL5 (CATHETERS) ×3
DEVICE RAD COMP TR BAND LRG (VASCULAR PRODUCTS) ×3
KIT HEART LEFT (KITS) ×3
KIT HEART RIGHT NAMIC (KITS) ×3
PACK CARDIAC CATHETERIZATION (CUSTOM PROCEDURE TRAY) ×3
SHEATH PINNACLE 5F 10CM (SHEATH) ×3
SHEATH PINNACLE 7F 10CM (SHEATH) ×3
SYR MEDRAD MARK V 150ML (SYRINGE) ×3
TRANSDUCER W/STOPCOCK (MISCELLANEOUS) ×6
TUBING CIL FLEX 10 FLL-RA (TUBING) ×3
WIRE EMERALD 3MM-J .025X260CM (WIRE) ×3
WIRE EMERALD 3MM-J .035X150CM (WIRE) ×3
WIRE EMERALD 3MM-J .035X260CM (WIRE) ×3
WIRE SAFE-T 1.5MM-J .035X260CM (WIRE)

## 2015-07-22 NOTE — Progress Notes (Signed)
Biotronik will check pacer and make adjustments.  To DDR with Cls

## 2015-07-22 NOTE — Progress Notes (Signed)
Patient Name: Jeffrey [name] Date of Encounter: 07/22/2015  Principal Problem:   Acute respiratory failure Active Problems:   DM (diabetes mellitus), type 2   HLD (hyperlipidemia)   CAD (coronary artery disease)   Chest pain   Fluid overload   ESRD on hemodialysis   Acute on chronic diastolic CHF (congestive heart failure)   Severe aortic stenosis   Length of Stay: 4  SUBJECTIVE  Seen during HD> Feeling better. For R/L cath later today for TAVR evaluation.  CURRENT MEDS . antiseptic oral rinse  7 mL Mouth Rinse BID  . aspirin  325 mg Oral Daily  . calcium carbonate  1 tablet Oral TID WC  . clopidogrel  75 mg Oral Daily  . darbepoetin (ARANESP) injection - DIALYSIS  100 mcg Intravenous Q Mon-HD  . heparin  5,000 Units Subcutaneous 3 times per day  . insulin aspart  0-9 Units Subcutaneous TID WC  . metoprolol tartrate  12.5 mg Oral BID  . midodrine  10 mg Oral TID WC  . multivitamin  1 tablet Oral QHS  . nitroGLYCERIN  1 inch Topical 4 times per day  . sodium chloride  3 mL Intravenous Q12H  . sodium chloride  3 mL Intravenous Q12H    OBJECTIVE   Intake/Output Summary (Last 24 hours) at 07/22/15 0926 Last data filed at 07/22/15 0600  Gross per 24 hour  Intake    660 ml  Output   2000 ml  Net  -1340 ml   Filed Weights   07/21/15 1030 07/22/15 0627 07/22/15 0748  Weight: 207 lb 0.2 oz (93.9 kg) 207 lb 14.4 oz (94.303 kg) 209 lb 3.5 oz (94.9 kg)    PHYSICAL EXAM Filed Vitals:   07/22/15 0750 07/22/15 0755 07/22/15 0830 07/22/15 0900  BP: 138/69 136/63 142/67 132/59  Pulse: 55 58 68 54  Temp:      TempSrc:      Resp: 16 16 16 15   Height:      Weight:      SpO2: 96% 96% 98% 99%   General: Alert, oriented x3, no distress Head: no evidence of trauma, PERRL, EOMI, no exophtalmos or lid lag, no myxedema, no xanthelasma; normal ears, nose and oropharynx Neck: normal jugular venous pulsations and no hepatojugular reflux; brisk carotid pulses without delay and no  carotid bruits Chest: clear to auscultation, no signs of consolidation by percussion or palpation, normal fremitus, symmetrical and full respiratory excursions Cardiovascular: normal position and quality of the apical impulse, regular rhythm, normal first and second heart sounds, no rubs or gallops, mid-late peaking widespread systolic murmur Abdomen: no tenderness or distention, no masses by palpation, no abnormal pulsatility or arterial bruits, normal bowel sounds, no hepatosplenomegaly Extremities: no clubbing, cyanosis or edema; 2+ radial, ulnar and brachial pulses bilaterally; 2+ right femoral, posterior tibial and dorsalis pedis pulses; 2+ left femoral, posterior tibial and dorsalis pedis pulses; no subclavian or femoral bruits Neurological: grossly nonfocal  LABS  CBC  Recent Labs  07/20/15 0310 07/22/15 0820  WBC 10.9* 10.6*  HGB 9.0* 8.4*  HCT 27.6* 25.3*  MCV 92.6 93.7  PLT 332 123456   Basic Metabolic Panel  Recent Labs  07/19/15 2025 07/20/15 0310 07/22/15 0820  NA 135 133* 134*  K 4.5 4.5 4.0  CL 96* 92* 95*  CO2 27 27 28   GLUCOSE 176* 167* 154*  BUN 44* 52* 33*  CREATININE 5.16* 5.72* 4.23*  CALCIUM 9.3 9.3 8.6*  PHOS 4.3  --   --  Liver Function Tests  Recent Labs  07/19/15 2025  ALBUMIN 2.9*    Radiology Studies Imaging results have been reviewed and No results found.    ASSESSMENT AND PLAN  1. Mild acute pericarditis, . No longer complains of pain. Trivial effusion. Continue conservative management  2. Acute on chronic diastolic heart failure - combination of progression to severe aortic stenosis and ESRD. HD for additional volume removal today. Marked improvement after volume removal with HD.   3. Severe aortic stenosis, now symptomatic. Being evaluated by multidisciplinary heart valve team. Pending interpretation of CTA chest and abdomen. Cath later today.   4. Moderate anemia - Hgb was 8.4 today. Was 9.0 8/24. Monitor closely.  5. Dual  chamber permanent pacemaker, recently placed. Normal lead parameters and good wound healing. Programmed VVIR backup currently due to concern re demand ischemia. Would try to reprogram DDDR today.  6. CAD with RCA-CTO and collateral filling - Continue ASA, lopressor and plavix.    Sanda Klein, MD, [facility] [phone] office [phone] pager 07/22/2015 9:26 AM

## 2015-07-22 NOTE — Progress Notes (Addendum)
Site area: RFA/RFV Site Prior to Removal:  Level 0 Pressure Applied For:40 min Manual:  yes  Patient Status During Pull:  stable Post Pull Site:  Possible lateral growth  1 Post Pull Instructions Given:  yes Post Pull Pulses Present: palpable Dressing Applied clear:   Bedrest begins @ 1600 till 2000 Comments:restless leg-possible lateral hematoma

## 2015-07-22 NOTE — Progress Notes (Signed)
Patient Name: Jeffrey [name] Date of Encounter: 07/22/2015  Principal Problem:  Acute respiratory failure Active Problems:  DM (diabetes mellitus), type 2  HLD (hyperlipidemia)  CAD (coronary artery disease)  Chest pain  Fluid overload  ESRD on hemodialysis  Acute on chronic diastolic CHF (congestive heart failure) Length of Stay: 3  SUBJECTIVE  Feels better. Seen in dialysis center. No chest pain, SOB or palpitation. Plan for cath later today for TVAR evaluation.   CURRENT MEDS . antiseptic oral rinse  7 mL Mouth Rinse BID  . aspirin  325 mg Oral Daily  . calcium carbonate  1 tablet Oral TID WC  . clopidogrel  75 mg Oral Daily  . darbepoetin (ARANESP) injection - DIALYSIS  100 mcg Intravenous Q Mon-HD  . heparin  5,000 Units Subcutaneous 3 times per day  . insulin aspart  0-9 Units Subcutaneous TID WC  . metoprolol tartrate  12.5 mg Oral BID  . midodrine  10 mg Oral TID WC  . multivitamin  1 tablet Oral QHS  . nitroGLYCERIN  1 inch Topical 4 times per day  . sodium chloride  3 mL Intravenous Q12H  . sodium chloride  3 mL Intravenous Q12H    OBJECTIVE  Filed Vitals:   07/22/15 0627 07/22/15 0748 07/22/15 0750 07/22/15 0755  BP: 105/39 149/75 138/69 136/63  Pulse: 57 60 55 58  Temp: 97.8 F (36.6 C) 97.3 F (36.3 C)    TempSrc: Oral Oral    Resp: 19 18 16 16   Height:      Weight: 207 lb 14.4 oz (94.303 kg) 209 lb 3.5 oz (94.9 kg)    SpO2: 95% 96% 96% 96%    Intake/Output Summary (Last 24 hours) at 07/22/15 0853 Last data filed at 07/22/15 0600  Gross per 24 hour  Intake    660 ml  Output   2000 ml  Net  -1340 ml   Filed Weights   07/21/15 1030 07/22/15 0627 07/22/15 0748  Weight: 207 lb 0.2 oz (93.9 kg) 207 lb 14.4 oz (94.303 kg) 209 lb 3.5 oz (94.9 kg)    PHYSICAL EXAM  General: Pleasant, NAD. Neuro: Alert and oriented X 3. Moves all extremities spontaneously. Psych: Normal affect. HEENT:  Normal  Neck: Supple without bruits or  JVD. Lungs:  Resp regular and unlabored, CTA. Heart: RRR no s3, s4. 3/6 harse systolic murmurs. Abdomen: Soft, non-tender, non-distended, BS + x 4.  Extremities: No clubbing, cyanosis or edema. DP/PT/Radials 2+ and equal bilaterally.  Accessory Clinical Findings  CBC  Recent Labs  07/20/15 0310 07/22/15 0820  WBC 10.9* 10.6*  HGB 9.0* 8.4*  HCT 27.6* 25.3*  MCV 92.6 93.7  PLT 332 123456   Basic Metabolic Panel  Recent Labs  07/19/15 2025 07/20/15 0310 07/22/15 0820  NA 135 133* 134*  K 4.5 4.5 4.0  CL 96* 92* 95*  CO2 27 27 28   GLUCOSE 176* 167* 154*  BUN 44* 52* 33*  CREATININE 5.16* 5.72* 4.23*  CALCIUM 9.3 9.3 8.6*  PHOS 4.3  --   --    Liver Function Tests  Recent Labs  07/19/15 2025  ALBUMIN 2.9*   TELE  Paced rhythm. PVCs.   Radiology/Studies  Dg Chest 2 View  07/18/2015   CLINICAL DATA:  Cough, chest pain and shortness of breath tonight. Recent pacemaker placement.  EXAM: CHEST  2 VIEW  COMPARISON:  07/12/2015  FINDINGS: Dual lead right-sided pacemaker, leads intact. No pneumothorax. Cardiomegaly is unchanged. Increased perivascular  haziness suggestive of pulmonary edema. Probable bilateral pleural effusions with blunting of the costophrenic angles and associated bibasilar atelectasis. No acute osseous abnormalities are seen.  IMPRESSION: Small pleural effusions and perivascular haziness suggestive of pulmonary edema. Recommend correlation for CHF.   Electronically Signed   By: Jeb Levering M.D.   On: 07/18/2015 02:08   Dg Chest 2 View  07/12/2015   CLINICAL DATA:  Status post pacemaker placement, low right shoulder pain  EXAM: CHEST - 2 VIEW  COMPARISON:  04/27/2015  FINDINGS: Cardiac shadow is stable. Lungs are well aerated bilaterally. No focal infiltrate or sizable effusion is noted. Mild central vascular congestion is noted without interstitial edema. A pacing device is now seen. No pneumothorax is noted. No bony abnormality is seen.  IMPRESSION:  Mild vascular congestion.  Status post pacemaker without pneumothorax.   Electronically Signed   By: Inez Catalina M.D.   On: 07/12/2015 07:41    ASSESSMENT AND PLAN    1. Mild acute pericarditis, . No longer complains of pain. Trivial effusion. Continue conservative management  2. Acute on chronic diastolic heart failure - combination of progression to severe aortic stenosis and ESRD. HD for additional volume removal today. Seen in HD center.   3. Severe aortic stenosis, now symptomatic. Being evaluated by multidisciplinary heart valve team. Pending interpretation of CTA chest and abdomen. Cath later today.   4. Moderate anemia - Hgb was 8.4 today. Was 9.0 8/24. Monitor closely.  5. Dual chamber permanent pacemaker, recently placed. Normal lead parameters and good wound healing. Programmed VVIR backup currently due to concern re demand ischemia.  6. CAD with RCA-CTO and collateral filling - Continue ASA, lopressor and plavix.   Jarrett Soho PA-C Pager [phone]

## 2015-07-22 NOTE — Progress Notes (Signed)
Patient refused CPAP for tonight. RT informed Pt to call if he changes his mind.

## 2015-07-22 NOTE — Interval H&P Note (Signed)
History and Physical Interval Note:  07/22/2015 1:59 PM  Jeffrey [name]  has presented today for cardiac cath with the diagnosis of severe aortic valve stenosis, workup for AVR/TAVR.  The various methods of treatment have been discussed with the patient and family. After consideration of risks, benefits and other options for treatment, the patient has consented to  Procedure(s): Left Heart Cath and Coronary Angiography (N/A) as a surgical intervention .  The patient's history has been reviewed, patient examined, no change in status, stable for surgery.  I have reviewed the patient's chart and labs.  Questions were answered to the patient's satisfaction.    Cath Lab Visit (complete for each Cath Lab visit)  Clinical Evaluation Leading to the Procedure:   ACS: No.  Non-ACS:    Anginal Classification: CCS III  Anti-ischemic medical therapy: Minimal Therapy (1 class of medications)  Non-Invasive Test Results: No non-invasive testing performed  Prior CABG: No previous CABG        MCALHANY,CHRISTOPHER

## 2015-07-22 NOTE — H&P (View-Only) (Signed)
Patient Name: Jeffrey [name] Date of Encounter: 07/22/2015  Principal Problem:   Acute respiratory failure Active Problems:   DM (diabetes mellitus), type 2   HLD (hyperlipidemia)   CAD (coronary artery disease)   Chest pain   Fluid overload   ESRD on hemodialysis   Acute on chronic diastolic CHF (congestive heart failure)   Severe aortic stenosis   Length of Stay: 4  SUBJECTIVE  Seen during HD> Feeling better. For R/L cath later today for TAVR evaluation.  CURRENT MEDS . antiseptic oral rinse  7 mL Mouth Rinse BID  . aspirin  325 mg Oral Daily  . calcium carbonate  1 tablet Oral TID WC  . clopidogrel  75 mg Oral Daily  . darbepoetin (ARANESP) injection - DIALYSIS  100 mcg Intravenous Q Mon-HD  . heparin  5,000 Units Subcutaneous 3 times per day  . insulin aspart  0-9 Units Subcutaneous TID WC  . metoprolol tartrate  12.5 mg Oral BID  . midodrine  10 mg Oral TID WC  . multivitamin  1 tablet Oral QHS  . nitroGLYCERIN  1 inch Topical 4 times per day  . sodium chloride  3 mL Intravenous Q12H  . sodium chloride  3 mL Intravenous Q12H    OBJECTIVE   Intake/Output Summary (Last 24 hours) at 07/22/15 0926 Last data filed at 07/22/15 0600  Gross per 24 hour  Intake    660 ml  Output   2000 ml  Net  -1340 ml   Filed Weights   07/21/15 1030 07/22/15 0627 07/22/15 0748  Weight: 207 lb 0.2 oz (93.9 kg) 207 lb 14.4 oz (94.303 kg) 209 lb 3.5 oz (94.9 kg)    PHYSICAL EXAM Filed Vitals:   07/22/15 0750 07/22/15 0755 07/22/15 0830 07/22/15 0900  BP: 138/69 136/63 142/67 132/59  Pulse: 55 58 68 54  Temp:      TempSrc:      Resp: 16 16 16 15   Height:      Weight:      SpO2: 96% 96% 98% 99%   General: Alert, oriented x3, no distress Head: no evidence of trauma, PERRL, EOMI, no exophtalmos or lid lag, no myxedema, no xanthelasma; normal ears, nose and oropharynx Neck: normal jugular venous pulsations and no hepatojugular reflux; brisk carotid pulses without delay and no  carotid bruits Chest: clear to auscultation, no signs of consolidation by percussion or palpation, normal fremitus, symmetrical and full respiratory excursions Cardiovascular: normal position and quality of the apical impulse, regular rhythm, normal first and second heart sounds, no rubs or gallops, mid-late peaking widespread systolic murmur Abdomen: no tenderness or distention, no masses by palpation, no abnormal pulsatility or arterial bruits, normal bowel sounds, no hepatosplenomegaly Extremities: no clubbing, cyanosis or edema; 2+ radial, ulnar and brachial pulses bilaterally; 2+ right femoral, posterior tibial and dorsalis pedis pulses; 2+ left femoral, posterior tibial and dorsalis pedis pulses; no subclavian or femoral bruits Neurological: grossly nonfocal  LABS  CBC  Recent Labs  07/20/15 0310 07/22/15 0820  WBC 10.9* 10.6*  HGB 9.0* 8.4*  HCT 27.6* 25.3*  MCV 92.6 93.7  PLT 332 [id number]   Basic Metabolic Panel  Recent Labs  07/19/15 2025 07/20/15 0310 07/22/15 0820  NA 135 133* 134*  K 4.5 4.5 4.0  CL 96* 92* 95*  CO2 27 27 28   GLUCOSE 176* 167* 154*  BUN 44* 52* 33*  CREATININE 5.16* 5.72* 4.23*  CALCIUM 9.3 9.3 8.6*  PHOS 4.3  --   --  Liver Function Tests  Recent Labs  07/19/15 2025  ALBUMIN 2.9*    Radiology Studies Imaging results have been reviewed and No results found.    ASSESSMENT AND PLAN  1. Mild acute pericarditis, . No longer complains of pain. Trivial effusion. Continue conservative management  2. Acute on chronic diastolic heart failure - combination of progression to severe aortic stenosis and ESRD. HD for additional volume removal today. Marked improvement after volume removal with HD.   3. Severe aortic stenosis, now symptomatic. Being evaluated by multidisciplinary heart valve team. Pending interpretation of CTA chest and abdomen. Cath later today.   4. Moderate anemia - Hgb was 8.4 today. Was 9.0 8/24. Monitor closely.  5. Dual  chamber permanent pacemaker, recently placed. Normal lead parameters and good wound healing. Programmed VVIR backup currently due to concern re demand ischemia. Would try to reprogram DDDR today.  6. CAD with RCA-CTO and collateral filling - Continue ASA, lopressor and plavix.    Sanda Klein, MD, [facility] [phone] office [phone] pager 07/22/2015 9:26 AM

## 2015-07-22 NOTE — Progress Notes (Signed)
Patient Demographics:    Jeffrey [name], is a 74 y.o. male, DOB - [birth year], DR:[id number]  Admit date - 07/18/2015   Admitting Physician Theressa Millard, MD  Outpatient Primary MD for the patient is Thurman Coyer, MD  LOS - 4   Chief Complaint  Patient presents with  . Chest Pain  . Shortness of Breath        Subjective:    [name] today has, No headache, No chest pain, No abdominal pain - No Nausea, No new weakness tingling or numbness, No Cough - improved shortness of breath.   Assessment  & Plan :     Acute respiratory failure with hypoxia due to acute on chronic diastolic CHF with EF [id number] underlying severe aortic stenosis in a patient with ESRD.  Dialysis for fluid removal, renal and cardiology on board, blood pressure borderline so admitted to drain to augment fluid removal during dialysis procedures. Further management per cardiology and renal. Supportive care with oxygen nebulizer treatment to continue. Beta blocker as tolerated by blood pressure.    Severe aortic stenosis. Cardiology on board, looking into TAV R procedure.   Chest pain in a setting of established coronary artery disease/Pericarditis  Likely due to pericarditis-->conservative management per cardiology, continue aspirin and Plavix.Elevated troponin due to demand ischemia in the setting of ESRD and decompensated CHF. L heart Cath due on 07-22-15.[name] Appreciate cardiology input.   Paroxysmal atrial fibrillation [place] VASC 2 score - 2. Patient is going in and out of atrial fibrillation during this admission , on aspirin and Plavix. Cardiology on board different" to cardiology. Per cardiology poor candidate for anticoagulation.   Hypertension - BP currently soft, low-dose beta blocker if tolerated.   Hyperlipidemia  - on home dose statin   Tic removal night of 07/19/2015. Unknown duration of tick exposure, given prophylaxis with doxycycline, Lyme serology -ve and [facility] serology pending.   Diabetes mellitus type 2 - last A1c on 04/27/2015 6.7. Currently on sliding scale along with low-dose Levemir will monitor.   CBG (last 3)   Recent Labs  07/21/15 1715 07/21/15 2133 07/22/15 0655  GLUCAP 163* 174* 128*       Code Status : Full  Family Communication  : Family bedside  Disposition Plan  : TBD  Consults  :  Cards, Renal  Procedures  :   CT Angela chest abdomen pelvis ordered by cardiology for possible TAVR procedure  DVT Prophylaxis  :  Heparin   Lab Results  Component Value Date   PLT 332 07/20/2015    Inpatient Medications  Scheduled Meds: . antiseptic oral rinse  7 mL Mouth Rinse BID  . aspirin  325 mg Oral Daily  . calcium carbonate  1 tablet Oral TID WC  . clopidogrel  75 mg Oral Daily  . darbepoetin (ARANESP) injection - DIALYSIS  100 mcg Intravenous Q Mon-HD  . heparin  5,000 Units Subcutaneous 3 times per day  . insulin aspart  0-9 Units Subcutaneous TID WC  . metoprolol tartrate  12.5 mg Oral BID  . midodrine  10 mg Oral TID WC  . multivitamin  1 tablet Oral QHS  . nitroGLYCERIN  1 inch Topical 4 times per day  . sodium chloride  3  mL Intravenous Q12H  . sodium chloride  3 mL Intravenous Q12H   Continuous Infusions: . sodium chloride     PRN Meds:.sodium chloride, sodium chloride, sodium chloride, sodium chloride, acetaminophen **OR** acetaminophen, albuterol, alteplase, feeding supplement (NEPRO CARB STEADY), heparin, HYDROcodone-homatropine, HYDROmorphone (DILAUDID) injection, lidocaine (PF), lidocaine-prilocaine, nitroGLYCERIN, ondansetron **OR** ondansetron (ZOFRAN) IV, pentafluoroprop-tetrafluoroeth, sodium chloride, sodium chloride  Antibiotics  :    Anti-infectives    Start     Dose/Rate Route Frequency Ordered Stop   07/21/15 1400   doxycycline (VIBRA-TABS) tablet 200 mg     200 mg Oral  Once 07/21/15 1027 07/21/15 1421   07/19/15 2200  doxycycline (VIBRA-TABS) tablet 100 mg  Status:  Discontinued     100 mg Oral Every 12 hours 07/19/15 2129 07/21/15 1027        Objective:   Filed Vitals:   07/22/15 0627 07/22/15 0748 07/22/15 0750 07/22/15 0755  BP: 105/39 149/75 138/69 136/63  Pulse: 57 60 55 58  Temp: 97.8 F (36.6 C) 97.3 F (36.3 C)    TempSrc: Oral Oral    Resp: 19 18 16 16   Height:      Weight: 94.303 kg (207 lb 14.4 oz) 94.9 kg (209 lb 3.5 oz)    SpO2: 95% 96% 96% 96%    Wt Readings from Last 3 Encounters:  07/22/15 94.9 kg (209 lb 3.5 oz)  07/12/15 99.4 kg (219 lb 2.2 oz)  07/07/15 100.608 kg (221 lb 12.8 oz)     Intake/Output Summary (Last 24 hours) at 07/22/15 0808 Last data filed at 07/22/15 0600  Gross per 24 hour  Intake    660 ml  Output   2000 ml  Net  -1340 ml     Physical Exam  Awake Alert, Oriented X 3, No new F.N deficits, Normal affect [place].AT,PERRAL Supple Neck,No JVD, No cervical lymphadenopathy appriciated.  Symmetrical Chest wall movement, Good air movement bilaterally, basilar wheezing RRR,No Gallops,Rubs, loud aortic systolic Murmur, No Parasternal Heave +ve B.Sounds, Abd Soft, No tenderness, No organomegaly appriciated, No rebound - guarding or rigidity. No Cyanosis, Clubbing or edema, No new Rash or bruise       Data Review:   Micro Results No results found for this or any previous visit (from the past 240 hour(s)).  Radiology Reports Dg Chest 2 View  07/18/2015   CLINICAL DATA:  Cough, chest pain and shortness of breath tonight. Recent pacemaker placement.  EXAM: CHEST  2 VIEW  COMPARISON:  07/12/2015  FINDINGS: Dual lead right-sided pacemaker, leads intact. No pneumothorax. Cardiomegaly is unchanged. Increased perivascular haziness suggestive of pulmonary edema. Probable bilateral pleural effusions with blunting of the costophrenic angles and associated  bibasilar atelectasis. No acute osseous abnormalities are seen.  IMPRESSION: Small pleural effusions and perivascular haziness suggestive of pulmonary edema. Recommend correlation for CHF.   Electronically Signed   By: Jeb Levering M.D.   On: 07/18/2015 02:08   Dg Chest 2 View  07/12/2015   CLINICAL DATA:  Status post pacemaker placement, low right shoulder pain  EXAM: CHEST - 2 VIEW  COMPARISON:  04/27/2015  FINDINGS: Cardiac shadow is stable. Lungs are well aerated bilaterally. No focal infiltrate or sizable effusion is noted. Mild central vascular congestion is noted without interstitial edema. A pacing device is now seen. No pneumothorax is noted. No bony abnormality is seen.  IMPRESSION: Mild vascular congestion.  Status post pacemaker without pneumothorax.   Electronically Signed   By: Inez Catalina M.D.   On: 07/12/2015 07:41  CBC  Recent Labs Lab 07/18/15 0134 07/18/15 0835 07/19/15 0241 07/20/15 0310  WBC 11.1* 9.6 10.0 10.9*  HGB 8.9* 7.9* 8.1* 9.0*  HCT 27.0* 23.4* 24.9* 27.6*  PLT 370 302 293 332  MCV 91.2 92.9 92.9 92.6  MCH 30.1 31.3 30.2 30.2  MCHC 33.0 33.8 32.5 32.6  RDW 14.3 14.4 14.6 14.8    Chemistries   Recent Labs Lab 07/18/15 0134 07/19/15 0241 07/19/15 2025 07/20/15 0310  NA 137 134* 135 133*  K 3.8 4.3 4.5 4.5  CL 94* 95* 96* 92*  CO2 28 28 27 27   GLUCOSE 124* 159* 176* 167*  BUN 44* 34* 44* 52*  CREATININE 5.63* 4.32* 5.16* 5.72*  CALCIUM 9.7 8.5* 9.3 9.3   ------------------------------------------------------------------------------------------------------------------ estimated creatinine clearance is 12.9 mL/min (by C-G formula based on Cr of 5.72). ------------------------------------------------------------------------------------------------------------------ No results for input(s): HGBA1C in the last 72 hours. ------------------------------------------------------------------------------------------------------------------ No  results for input(s): CHOL, HDL, LDLCALC, TRIG, CHOLHDL, LDLDIRECT in the last 72 hours. ------------------------------------------------------------------------------------------------------------------ No results for input(s): TSH, T4TOTAL, T3FREE, THYROIDAB in the last 72 hours.  Invalid input(s): FREET3 ------------------------------------------------------------------------------------------------------------------ No results for input(s): VITAMINB12, FOLATE, FERRITIN, TIBC, IRON, RETICCTPCT in the last 72 hours.  Coagulation profile No results for input(s): INR, PROTIME in the last 168 hours.  No results for input(s): DDIMER in the last 72 hours.  Cardiac Enzymes  Recent Labs Lab 07/18/15 0401 07/18/15 1039 07/18/15 1603  TROPONINI 0.73* 0.82* 0.64*   ------------------------------------------------------------------------------------------------------------------ Invalid input(s): POCBNP   Time Spent in minutes  35   SINGH,PRASHANT K M.D on 07/22/2015 at 8:08 AM  Between 7am to 7pm - Pager - [phone]  After 7pm go to [email or web address] - password [facility]  [facility] -  Office  [phone]

## 2015-07-22 NOTE — Progress Notes (Signed)
[facility] Progress Note  Assessment/Plan: 1. Pulm edema / vol excess- HD 8/22 UF 2 L 1.4 L 8/24 and 2 L 8/25 - post weight 93.9 - wieght up 1 kg this am from yesterday - HD today then d/c daily HD - however, he needs to strictly watch fluids; has home O2 but had not been using it - if gains high, may need four time a week HD  2. ESRD = MWF hd - goal today - set new EDW to post HD weight today K 4 today - changed to 4 K bath 3. Sec HPTH= tums decreased to 3 ac 4. Anemia= HGB 9.0 down to 8.4 - resume weekly Fe - will need ^ in Aranesp Monday if still down - need to keep Hgb up due to severe AS 5. AS severe= Card . eval in process for AV surgery 6. SP pacemaker 7. DM  Myriam Jacobson, PA-C [place] 07/22/2015,9:28 AM  LOS: 4 days   Pt seen, examined and agree w A/P as above.  Kelly Splinter MD pager [phone]    cell [phone] 07/22/2015, 10:44 AM    Subjective:   Sleepy - they kept waking me up all night  Objective Filed Vitals:   07/22/15 0750 07/22/15 0755 07/22/15 0830 07/22/15 0900  BP: 138/69 136/63 142/67 132/59  Pulse: 55 58 68 54  Temp:      TempSrc:      Resp: 16 16 16 15   Height:      Weight:      SpO2: 96% 96% 98% 99%   Physical Exam goal 3.5 on HD  General: NAD supine breathing easily Heart:irreg irreg 3/6 murmur Lungs: no rales Abdomen: soft NT Extremities: no edema Dialysis Access: left upper AVF  Dialysis Orders: [facility] MWF 4hr, BFR 400, 2K 2Ca, DW 97.5 heparin none Mircera 100 q 2 Venofer 50/wk  Additional Objective Labs: Basic Metabolic Panel:  Recent Labs Lab 07/19/15 2025 07/20/15 0310 07/22/15 0820  NA 135 133* 134*  K 4.5 4.5 4.0  CL 96* 92* 95*  CO2 27 27 28   GLUCOSE 176* 167* 154*  BUN 44* 52* 33*  CREATININE 5.16* 5.72* 4.23*  CALCIUM 9.3 9.3 8.6*  PHOS 4.3  --   --    Liver Function Tests:  Recent Labs Lab 07/19/15 2025  ALBUMIN 2.9*   CBC:  Recent Labs Lab  07/18/15 0134 07/18/15 0835 07/19/15 0241 07/20/15 0310 07/22/15 0820  WBC 11.1* 9.6 10.0 10.9* 10.6*  HGB 8.9* 7.9* 8.1* 9.0* 8.4*  HCT 27.0* 23.4* 24.9* 27.6* 25.3*  MCV 91.2 92.9 92.9 92.6 93.7  PLT 370 302 293 332 345  Cardiac Enzymes:  Recent Labs Lab 07/18/15 0301 07/18/15 0401 07/18/15 1039 07/18/15 1603  TROPONINI 0.72* 0.73* 0.82* 0.64*   CBG:  Recent Labs Lab 07/21/15 0614 07/21/15 1132 07/21/15 1715 07/21/15 2133 07/22/15 0655  GLUCAP 139* 120* 163* 174* 128*  Medications: . sodium chloride     . antiseptic oral rinse  7 mL Mouth Rinse BID  . aspirin  325 mg Oral Daily  . calcium carbonate  1 tablet Oral TID WC  . clopidogrel  75 mg Oral Daily  . darbepoetin (ARANESP) injection - DIALYSIS  100 mcg Intravenous Q Mon-HD  . heparin  5,000 Units Subcutaneous 3 times per day  . insulin aspart  0-9 Units Subcutaneous TID WC  . metoprolol tartrate  12.5 mg Oral BID  . midodrine  10 mg Oral TID WC  .  multivitamin  1 tablet Oral QHS  . nitroGLYCERIN  1 inch Topical 4 times per day  . sodium chloride  3 mL Intravenous Q12H  . sodium chloride  3 mL Intravenous Q12H

## 2015-07-23 LAB — GLUCOSE, CAPILLARY
Glucose-Capillary: 135 mg/dL — ABNORMAL HIGH (ref 65–99)
Glucose-Capillary: 132 mg/dL — ABNORMAL HIGH (ref 65–99)
Glucose-Capillary: 136 mg/dL — ABNORMAL HIGH (ref 65–99)
Glucose-Capillary: 187 mg/dL — ABNORMAL HIGH (ref 65–99)

## 2015-07-23 LAB — BASIC METABOLIC PANEL
Anion gap: 11 (ref 5–15)
BUN: 25 mg/dL — AB (ref 6–20)
CHLORIDE: 95 mmol/L — AB (ref 101–111)
CO2: 28 mmol/L (ref 22–32)
CREATININE: 3.96 mg/dL — AB (ref 0.61–1.24)
Calcium: 8.5 mg/dL — ABNORMAL LOW (ref 8.9–10.3)
GFR calc Af Amer: 16 mL/min — ABNORMAL LOW (ref >60)
GFR calc non Af Amer: 13 mL/min — ABNORMAL LOW (ref >60)
GLUCOSE: 149 mg/dL — AB (ref 65–99)
Potassium: 4.1 mmol/L (ref 3.5–5.1)
SODIUM: 134 mmol/L — AB (ref 135–145)

## 2015-07-23 LAB — CBC
HCT: 26 % — ABNORMAL LOW (ref 39.0–52.0)
Hemoglobin: 8.5 g/dL — ABNORMAL LOW (ref 13.0–17.0)
MCH: 30.8 pg (ref 26.0–34.0)
MCHC: 32.7 g/dL (ref 30.0–36.0)
MCV: 94.2 fL (ref 78.0–100.0)
PLATELETS: 357 10*3/uL (ref 150–400)
RBC: 2.76 MIL/uL — ABNORMAL LOW (ref 4.22–5.81)
RDW: 15.4 % (ref 11.5–15.5)
WBC: 11.1 10*3/uL — AB (ref 4.0–10.5)

## 2015-07-23 NOTE — Progress Notes (Signed)
Subjective:  No complaints of shortness of breath overnight.  Tolerated cath Well yesterday.  Objective:  Vital Signs in the last 24 hours: BP 93/47 mmHg  Pulse 79  Temp(Src) 98.3 F (36.8 C) (Oral)  Resp 20  Ht 5\' 11"  (1.803 m)  Wt 92.9 kg (204 lb 12.9 oz)  BMI 28.58 kg/m2  SpO2 97%  Physical Exam: Pleasant male in no acute distress Lungs:  Clear  Cardiac:  Regular rhythm, normal S1 and S2, no S3, irregular beats noted, harsh 2 to 3/6 systolic murmur of aortic stenosis late peaking  Abdomen:  Soft, nontender, no masses Extremities:  No edema present, Site clean and dry.    Intake/Output from previous day: 08/26 0701 - 08/27 0700 In: 240 [P.O.:240] Out: 2490  Weight Filed Weights   07/22/15 0748 07/22/15 1120 07/23/15 0446  Weight: 94.9 kg (209 lb 3.5 oz) 92.4 kg (203 lb 11.3 oz) 92.9 kg (204 lb 12.9 oz)    Lab Results: Basic Metabolic Panel:  Recent Labs  07/22/15 0820 07/23/15 0321  NA 134* 134*  K 4.0 4.1  CL 95* 95*  CO2 28 28  GLUCOSE 154* 149*  BUN 33* 25*  CREATININE 4.23* 3.96*    CBC:  Recent Labs  07/22/15 0820 07/23/15 0321  WBC 10.6* 11.1*  HGB 8.4* 8.5*  HCT 25.3* 26.0*  MCV 93.7 94.2  PLT 345 357    BNP    Component Value Date/Time   BNP 2856.0* 07/18/2015 0209    PROTIME: Lab Results  Component Value Date   INR 1.28 07/22/2015   INR 1.44 07/11/2015   INR 1.19 04/27/2015    Telemetry: Sinus rhythm some atrial paced with ventricular paced beats  Assessment/Plan:  1.  Severe aortic stenosis currently being evaluated for TAVR 2.  Coronary artery disease at catheterization stable with chronic total occlusion of the right coronary artery with moderate LAD disease 3.  End-stage renal disease on dialysis  Recommendations:  It might be best to go ahead and get his gated cardiac scan on Monday prior to discharge.  Catheterization site appears to be doing well.  Breathing is much better.     Kerry Hough  MD  [facility]  07/23/2015, 10:45 AM

## 2015-07-23 NOTE — Progress Notes (Addendum)
Patient Demographics:    Jeffrey [name], is a 74 y.o. male, DOB - [birth year], DR:[id number]  Admit date - 07/18/2015   Admitting Physician Theressa Millard, MD  Outpatient Primary MD for the patient is Thurman Coyer, MD  LOS - 5   Chief Complaint  Patient presents with  . Chest Pain  . Shortness of Breath        Subjective:    Jeffrey [name] today has, No headache, No chest pain, No abdominal pain - No Nausea, No new weakness tingling or numbness, No Cough - improved shortness of breath.   Assessment  & Plan :     Acute respiratory failure with hypoxia due to acute on chronic diastolic CHF with EF [id number] underlying severe aortic stenosis in a patient with ESRD.  Dialysis for fluid removal, renal and cardiology on board, blood pressure borderline so admitted to drain to augment fluid removal during dialysis procedures. Further management per cardiology and renal. Supportive care with oxygen nebulizer treatment to continue. Beta blocker as tolerated by blood pressure.    Severe aortic stenosis. Cardiology on board, looking into TAV R procedure, likely Monday.   Chest pain in a setting of established coronary artery disease/Pericarditis  Likely due to pericarditis-->conservative management per cardiology, continue aspirin and Plavix.Elevated troponin due to demand ischemia in the setting of ESRD and decompensated CHF. L heart Cath on 07-22-15 stable as below.    Paroxysmal atrial fibrillation [place] VASC 2 score - 2. Patient is going in and out of atrial fibrillation during this admission , on aspirin and Plavix. Cardiology on board different" to cardiology. Per cardiology poor candidate for anticoagulation.   Incidental L Adrenal and R Kidney lesions - follow with PCP.   Hypertension - BP  currently soft, low-dose beta blocker if tolerated.   Hyperlipidemia - on home dose statin   Tic removal night of 07/19/2015. Unknown duration of tick exposure, given prophylaxis with doxycycline 200 mg 1 dose, Lyme serology -ve and [facility] serology IgM -ve IgG +ve, D/W ID - Dr Dairl Ponder,  this likely is past infection and since asymptomatic no further treatment..   Diabetes mellitus type 2 - last A1c on 04/27/2015 6.7. Currently on sliding scale along with low-dose Levemir will monitor.   CBG (last 3)   Recent Labs  07/22/15 1653 07/22/15 2142 07/23/15 0601  GLUCAP 117* 207* 135*       Code Status : Full  Family Communication  : Family bedside  Disposition Plan  : TBD  Consults  :  Cards, Renal ID Dr. De Burrs over the phone  Procedures  :   CT Angela chest abdomen pelvis ordered by cardiology for possible TAVR procedure  L Heart Cath -   Mid RCA lesion, 100% stenosed with distal vessel filling from left to right collaterals.  Prox Cx lesion, 20% stenosed.  Mid Cx lesion, 30% stenosed.  3rd Mrg lesion, 20% stenosed.  Mid LAD lesion, 50% stenosed.  Ost 2nd Diag lesion, 40% stenosed.  1. 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 This does not appear to be flow limiting. Chronic occlusion mid RCA with filling of the distal vessel from left to right collaterals.  2. Severe aortic stenosis by  echo and cardiac cath in November. I did not cross the aortic valve today.   Recommendations: Continue planning for AVR vs TAVR. He will not need coronary intervention before the aortic valve procedure. He may be able to be discharged this weekend and have follow up in the valve clinic to finalize planning for the aortic valve procedure.   DVT Prophylaxis  :  Heparin   Lab Results  Component Value Date   PLT 357 07/23/2015    Inpatient Medications  Scheduled Meds: . antiseptic oral rinse  7 mL Mouth Rinse BID  .  aspirin  325 mg Oral Daily  . calcium carbonate  3 tablet Oral TID WC  . clopidogrel  75 mg Oral Daily  . darbepoetin (ARANESP) injection - DIALYSIS  100 mcg Intravenous Q Mon-HD  . ferric gluconate (FERRLECIT/NULECIT) IV  62.5 mg Intravenous Q Fri-HD  . heparin  5,000 Units Subcutaneous 3 times per day  . insulin aspart  0-9 Units Subcutaneous TID WC  . metoprolol tartrate  12.5 mg Oral BID  . midodrine  10 mg Oral TID WC  . multivitamin  1 tablet Oral QHS  . sodium chloride  3 mL Intravenous Q12H  . sodium chloride  3 mL Intravenous Q12H  . sodium chloride  3 mL Intravenous Q12H   Continuous Infusions:   PRN Meds:.sodium chloride, sodium chloride, sodium chloride, acetaminophen **OR** acetaminophen, albuterol, HYDROcodone-homatropine, HYDROmorphone (DILAUDID) injection, nitroGLYCERIN, ondansetron **OR** ondansetron (ZOFRAN) IV, sodium chloride, sodium chloride, sodium chloride  Antibiotics  :    Anti-infectives    Start     Dose/Rate Route Frequency Ordered Stop   07/21/15 1400  doxycycline (VIBRA-TABS) tablet 200 mg     200 mg Oral  Once 07/21/15 1027 07/21/15 1421   07/19/15 2200  doxycycline (VIBRA-TABS) tablet 100 mg  Status:  Discontinued     100 mg Oral Every 12 hours 07/19/15 2129 07/21/15 1027        Objective:   Filed Vitals:   07/22/15 1819 07/22/15 2216 07/23/15 0156 07/23/15 0446  BP: 100/46 102/60 96/45 103/41  Pulse:  72 74 83  Temp:  97.6 F (36.4 C) 98 F (36.7 C) 98.3 F (36.8 C)  TempSrc:  Oral Oral Oral  Resp:  18 20 18   Height:      Weight:    92.9 kg (204 lb 12.9 oz)  SpO2:  98% 97% 97%    Wt Readings from Last 3 Encounters:  07/23/15 92.9 kg (204 lb 12.9 oz)  07/12/15 99.4 kg (219 lb 2.2 oz)  07/07/15 100.608 kg (221 lb 12.8 oz)     Intake/Output Summary (Last 24 hours) at 07/23/15 1011 Last data filed at 07/23/15 [id number]  Gross per 24 hour  Intake    480 ml  Output   2490 ml  Net  -2010 ml     Physical Exam  Awake Alert, Oriented X  3, No new F.N deficits, Normal affect [place].AT,PERRAL Supple Neck,No JVD, No cervical lymphadenopathy appriciated.  Symmetrical Chest wall movement, Good air movement bilaterally, basilar wheezing RRR,No Gallops,Rubs, loud aortic systolic Murmur, No Parasternal Heave +ve B.Sounds, Abd Soft, No tenderness, No organomegaly appriciated, No rebound - guarding or rigidity. No Cyanosis, Clubbing or edema, No new Rash or bruise       Data Review:   Micro Results No results found for this or any previous visit (from the past 240 hour(s)).  Radiology Reports Dg Chest 2 View  07/18/2015   CLINICAL DATA:  Cough,  chest pain and shortness of breath tonight. Recent pacemaker placement.  EXAM: CHEST  2 VIEW  COMPARISON:  07/12/2015  FINDINGS: Dual lead right-sided pacemaker, leads intact. No pneumothorax. Cardiomegaly is unchanged. Increased perivascular haziness suggestive of pulmonary edema. Probable bilateral pleural effusions with blunting of the costophrenic angles and associated bibasilar atelectasis. No acute osseous abnormalities are seen.  IMPRESSION: Small pleural effusions and perivascular haziness suggestive of pulmonary edema. Recommend correlation for CHF.   Electronically Signed   By: Jeb Levering M.D.   On: 07/18/2015 02:08   Dg Chest 2 View  07/12/2015   CLINICAL DATA:  Status post pacemaker placement, low right shoulder pain  EXAM: CHEST - 2 VIEW  COMPARISON:  04/27/2015  FINDINGS: Cardiac shadow is stable. Lungs are well aerated bilaterally. No focal infiltrate or sizable effusion is noted. Mild central vascular congestion is noted without interstitial edema. A pacing device is now seen. No pneumothorax is noted. No bony abnormality is seen.  IMPRESSION: Mild vascular congestion.  Status post pacemaker without pneumothorax.   Electronically Signed   By: Inez Catalina M.D.   On: 07/12/2015 07:41     CBC  Recent Labs Lab 07/18/15 0835 07/19/15 0241 07/20/15 0310 07/22/15 0820  07/23/15 0321  WBC 9.6 10.0 10.9* 10.6* 11.1*  HGB 7.9* 8.1* 9.0* 8.4* 8.5*  HCT 23.4* 24.9* 27.6* 25.3* 26.0*  PLT 302 293 332 345 357  MCV 92.9 92.9 92.6 93.7 94.2  MCH 31.3 30.2 30.2 31.1 30.8  MCHC 33.8 32.5 32.6 33.2 32.7  RDW 14.4 14.6 14.8 15.1 15.4    Chemistries   Recent Labs Lab 07/19/15 0241 07/19/15 2025 07/20/15 0310 07/22/15 0820 07/23/15 0321  NA 134* 135 133* 134* 134*  K 4.3 4.5 4.5 4.0 4.1  CL 95* 96* 92* 95* 95*  CO2 28 27 27 28 28   GLUCOSE 159* 176* 167* 154* 149*  BUN 34* 44* 52* 33* 25*  CREATININE 4.32* 5.16* 5.72* 4.23* 3.96*  CALCIUM 8.5* 9.3 9.3 8.6* 8.5*   ------------------------------------------------------------------------------------------------------------------ estimated creatinine clearance is 18.5 mL/min (by C-G formula based on Cr of 3.96). ------------------------------------------------------------------------------------------------------------------ No results for input(s): HGBA1C in the last 72 hours. ------------------------------------------------------------------------------------------------------------------ No results for input(s): CHOL, HDL, LDLCALC, TRIG, CHOLHDL, LDLDIRECT in the last 72 hours. ------------------------------------------------------------------------------------------------------------------ No results for input(s): TSH, T4TOTAL, T3FREE, THYROIDAB in the last 72 hours.  Invalid input(s): FREET3 ------------------------------------------------------------------------------------------------------------------ No results for input(s): VITAMINB12, FOLATE, FERRITIN, TIBC, IRON, RETICCTPCT in the last 72 hours.  Coagulation profile  Recent Labs Lab 07/22/15 0720  INR 1.28    No results for input(s): DDIMER in the last 72 hours.  Cardiac Enzymes  Recent Labs Lab 07/18/15 0401 07/18/15 1039 07/18/15 1603  TROPONINI 0.73* 0.82* 0.64*    ------------------------------------------------------------------------------------------------------------------ Invalid input(s): POCBNP   Time Spent in minutes  35   SINGH,PRASHANT K M.D on 07/23/2015 at 10:11 AM  Between 7am to 7pm - Pager - [phone]  After 7pm go to [email or web address] - password [facility]  [facility] -  Office  [phone]

## 2015-07-23 NOTE — Progress Notes (Signed)
  [facility] Progress Note  Assessment: 1 Pulm edema , resolved 2 ESRD new lower dry wt, mwf hd 3 Severe AS per cards 4 Anemia fe, esa 5 SP pacemaker 6 DM  Plan - ok for dc from renal standpoint  Kelly Splinter MD (pgr) [phone]    ([phone] 07/23/2015, 11:50 AM   Subjective: doing well, wants to go home  Objective Filed Vitals:   07/23/15 0156 07/23/15 0446 07/23/15 1000 07/23/15 1039  BP: 96/45 103/41 93/47 93/47   Pulse: 74 83 79 79  Temp: 98 F (36.7 C) 98.3 F (36.8 C)    TempSrc: Oral Oral    Resp: 20 18  20   Height:      Weight:  92.9 kg (204 lb 12.9 oz)    SpO2: 97% 97%  97%   Physical Exam goal 3.5 on HD  General: NAD supine breathing easily Heart:irreg irreg 3/6 murmur Lungs: no rales Abdomen: soft NT Extremities: no edema Dialysis Access: left upper AVF  Dialysis Orders: [facility] MWF 4hr, BFR 400, 2K 2Ca, DW 97.5 heparin none Mircera 100 q 2 Venofer 50/wk  Additional Objective Labs: Basic Metabolic Panel:  Recent Labs Lab 07/19/15 2025 07/20/15 0310 07/22/15 0820 07/23/15 0321  NA 135 133* 134* 134*  K 4.5 4.5 4.0 4.1  CL 96* 92* 95* 95*  CO2 27 27 28 28   GLUCOSE 176* 167* 154* 149*  BUN 44* 52* 33* 25*  CREATININE 5.16* 5.72* 4.23* 3.96*  CALCIUM 9.3 9.3 8.6* 8.5*  PHOS 4.3  --   --   --    Liver Function Tests:  Recent Labs Lab 07/19/15 2025  ALBUMIN 2.9*   CBC:  Recent Labs Lab 07/18/15 0835 07/19/15 0241 07/20/15 0310 07/22/15 0820 07/23/15 0321  WBC 9.6 10.0 10.9* 10.6* 11.1*  HGB 7.9* 8.1* 9.0* 8.4* 8.5*  HCT 23.4* 24.9* 27.6* 25.3* 26.0*  MCV 92.9 92.9 92.6 93.7 94.2  PLT 302 293 332 345 357  Cardiac Enzymes:  Recent Labs Lab 07/18/15 0301 07/18/15 0401 07/18/15 1039 07/18/15 1603  TROPONINI 0.72* 0.73* 0.82* 0.64*   CBG:  Recent Labs Lab 07/22/15 1159 07/22/15 1653 07/22/15 2142 07/23/15 0601 07/23/15 1113  GLUCAP 128* 117* 207* 135* 132*  Medications:   . antiseptic oral rinse   7 mL Mouth Rinse BID  . aspirin  325 mg Oral Daily  . calcium carbonate  3 tablet Oral TID WC  . clopidogrel  75 mg Oral Daily  . darbepoetin (ARANESP) injection - DIALYSIS  100 mcg Intravenous Q Mon-HD  . ferric gluconate (FERRLECIT/NULECIT) IV  62.5 mg Intravenous Q Fri-HD  . heparin  5,000 Units Subcutaneous 3 times per day  . insulin aspart  0-9 Units Subcutaneous TID WC  . metoprolol tartrate  12.5 mg Oral BID  . midodrine  10 mg Oral TID WC  . multivitamin  1 tablet Oral QHS  . sodium chloride  3 mL Intravenous Q12H  . sodium chloride  3 mL Intravenous Q12H  . sodium chloride  3 mL Intravenous Q12H

## 2015-07-23 NOTE — Progress Notes (Signed)
[address]       [place] [address]             [phone]     CARDIOTHORACIC SURGERY PROGRESS NOTE  1 Day Post-Op  S/P Procedure(s) (LRB): Right/Left Heart Cath and Coronary Angiography (N/A)  Subjective: Feels better  Objective: Vital signs in last 24 hours: Temp:  [97.6 F (36.4 C)-98.3 F (36.8 C)] 98.3 F (36.8 C) (08/27 0446) Pulse Rate:  [68-135] 79 (08/27 1039) Cardiac Rhythm:  [-] Ventricular paced (08/27 0701) Resp:  [10-21] 20 (08/27 1039) BP: (93-122)/(41-81) 93/47 mmHg (08/27 1039) SpO2:  [89 %-100 %] 97 % (08/27 1039) Weight:  [92.9 kg (204 lb 12.9 oz)] 92.9 kg (204 lb 12.9 oz) (08/27 0446)  Physical Exam:  Rhythm:   paced  Breath sounds: Few crackles at base  Heart sounds:  RRR w/ systolic murmur  Incisions:  n/a  Abdomen:  soft  Extremities:  warm   Intake/Output from previous day: 08/26 0701 - 08/27 0700 In: 240 [P.O.:240] Out: 2490  Intake/Output this shift: Total I/O In: 240 [P.O.:240] Out: -   Lab Results:  Recent Labs  07/22/15 0820 07/23/15 0321  WBC 10.6* 11.1*  HGB 8.4* 8.5*  HCT 25.3* 26.0*  PLT 345 357   BMET:  Recent Labs  07/22/15 0820 07/23/15 0321  NA 134* 134*  K 4.0 4.1  CL 95* 95*  CO2 28 28  GLUCOSE 154* 149*  BUN 33* 25*  CREATININE 4.23* 3.96*  CALCIUM 8.6* 8.5*    CBG (last 3)   Recent Labs  07/22/15 2142 07/23/15 0601 07/23/15 1113  GLUCAP 207* 135* 132*   PT/INR:   Recent Labs  07/22/15 0720  LABPROT 16.2*  INR 1.28    CXR:  CHEST 2 VIEW  COMPARISON: 07/12/2015  FINDINGS: Dual lead right-sided pacemaker, leads intact. No pneumothorax. Cardiomegaly is unchanged. Increased perivascular haziness suggestive of pulmonary edema. Probable bilateral pleural effusions with blunting of the costophrenic angles and associated bibasilar atelectasis. No acute osseous abnormalities are seen.  IMPRESSION: Small pleural effusions and perivascular haziness suggestive  of pulmonary edema. Recommend correlation for CHF.   Electronically Signed  By: Jeb Levering M.D.  On: 07/18/2015 02:08   CARDIAC CATHETERIZATION  Procedures    Right/Left Heart Cath and Coronary Angiography    PACS Images    Show images for Cardiac catheterization     Link to Procedure Log    Procedure Log      Indications    Severe aortic valve stenosis [I35.0 (ICD-10-CM)]    Technique and Indications    Estimated blood loss <50 mL. Indication: Severe aortic valve stenosis, chest pain concerning for unstable angina, known CAD. Cardiac cath today to assess right heart pressures and exclude progression of CAD.   Procedure: The risks, benefits, complications, treatment options, and expected outcomes were discussed with the patient. The patient and/or family concurred with the proposed plan, giving informed consent. The patient was brought to the cath lab after IV hydration was begun and oral premedication was given. The patient was further sedated with Versed and Fentanyl. The right groin was prepped and draped in the usual manner. Using the modified Seldinger access technique, a 5 French sheath was placed in the right femoral artery. A 7 French sheath was placed in the right femoral vein. A right heart catheterization was performed with a balloon tipped catheter. Standard diagnostic catheters were used to perform selective coronary angiography. The RCA was engaged  with a JR4 catheter and a the left main was engaged with a JL5 catheter. I changed to a 5 Fr JL5 guiding catheter to get better filling of the coronaries. I did not attempt to cross the aortic valve.   There were no immediate complications. The patient was taken to the recovery area in stable condition.   EBL: 5 cc    Conclusion     Mid RCA lesion, 100% stenosed with distal vessel filling from left to right collaterals.  Prox Cx lesion, 20% stenosed.  Mid Cx lesion, 30% stenosed.  3rd Mrg lesion,  20% stenosed.  Mid LAD lesion, 50% stenosed.  Ost 2nd Diag lesion, 40% stenosed.  1. 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 This does not appear to be flow limiting. Chronic occlusion mid RCA with filling of the distal vessel from left to right collaterals.  2. Severe aortic stenosis by echo and cardiac cath in November. I did not cross the aortic valve today.   Recommendations: Continue planning for AVR vs TAVR. He will not need coronary intervention before the aortic valve procedure. He may be able to be discharged this weekend and have follow up in the valve clinic to finalize planning for the aortic valve procedure.      Coronary Findings    Dominance: Right   Left Anterior Descending   . Prox LAD lesion, 50% stenosed. calcified .   [name] First Diagonal Branch   The vessel is moderate in size.   . First Septal Branch   The vessel is moderate in size.   [name] Second Diagonal Branch   The vessel is small in size.   Colon Flattery 2nd Diag lesion, 40% stenosed. discrete .   [name] Second Septal Branch   The vessel is small in size.   . Third Diagonal Branch   The vessel is small in size.   . Third Septal Branch   The vessel is small in size.     Left Circumflex   . Prox Cx lesion, 20% stenosed. calcified discrete .   [name] Mid Cx lesion, 30% stenosed. diffuse .   [name] First Obtuse Marginal Branch   The vessel is small in size.   [name] Second Obtuse Marginal Branch   The vessel is moderate in size.   . Third Obtuse Marginal Branch   . 3rd Mrg lesion, 20% stenosed. diffuse .     Right Coronary Artery   . Mid RCA lesion, 100% stenosed. chronic total occlusion .   [name] Right Posterior Descending Artery   RPDA filled by collaterals from 3rd Sept.       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    Coronary Diagrams    Diagnostic Diagram            Implants    Name ID Temporary Type Supply     No information to display    Hemo Data       Most Recent Value   Fick Cardiac Output  7.27 L/min   Fick Cardiac Output Index  3.4 (L/min)/BSA   RA A Wave  6 mmHg   RA V Wave  7 mmHg   RA Mean  7 mmHg   RV Systolic Pressure  57 mmHg   RV Diastolic Pressure  2 mmHg   RV EDP  8 mmHg   PA Systolic Pressure  60 mmHg   PA Diastolic Pressure  16 mmHg  PA Mean  38 mmHg   PW A Wave  27 mmHg   PW V Wave  22 mmHg   PW Mean  24 mmHg   AO Systolic Pressure  123XX123 mmHg   AO Diastolic Pressure  46 mmHg   AO Mean  71 mmHg   QP/QS  1   TPVR Index  11.19 HRUI   TSVR Index  20.91 HRUI   PVR SVR Ratio  0.22   TPVR/TSVR Ratio  0.54         Assessment/Plan: S/P Procedure(s) (LRB): Right/Left Heart Cath and Coronary Angiography (N/A)  Mr Allums remains clinically stable, overall much improved since initiation of dialysis.  Results of cath noted.  No significant progression of CAD since previous cath.  Moderate pulmonary hypertension.  CTA chest/abd/pelvis reveals diffuse atherosclerotic disease with heavy calcification involving most of the thoracic and abdominal aorta.  He does not appear to have adequate pelvic vascular access for transfemoral approach for TAVR.  Left subclavian approach probably should be avoided because of dialysis access in left upper arm.  Cardiac-gated CTA heart remains pending.  If Mr Mclinn goes home we will arrange for it to be performed as an outpatient and f/u in multidisciplinary heart valve clinic once it has been completed.   I spent in excess of 15 minutes during the conduct of this hospital encounter and >50% of this time involved direct face-to-face encounter with the patient for counseling and/or coordination of their care.    Rexene Alberts 07/23/2015 12:56 PM

## 2015-07-23 NOTE — Progress Notes (Signed)
Patient refused CPAP for tonight 

## 2015-07-24 DIAGNOSIS — E785 Hyperlipidemia, unspecified: Secondary | ICD-10-CM

## 2015-07-24 LAB — GLUCOSE, CAPILLARY
GLUCOSE-CAPILLARY: 154 mg/dL — AB (ref 65–99)
GLUCOSE-CAPILLARY: 219 mg/dL — AB (ref 65–99)
GLUCOSE-CAPILLARY: 153 mg/dL — AB (ref 65–99)
Glucose-Capillary: 157 mg/dL — ABNORMAL HIGH (ref 65–99)

## 2015-07-24 NOTE — Progress Notes (Signed)
Subjective:  Had some shortness of breath this morning when his oxygen tube got cramped.  No chest pain currently and sitting up on side of bed, no chest pain  Objective:  Vital Signs in the last 24 hours: BP 127/71 mmHg  Pulse 92  Temp(Src) 98.1 F (36.7 C) (Oral)  Resp 20  Ht 5\' 11"  (1.803 m)  Wt 94.257 kg (207 lb 12.8 oz)  BMI 28.99 kg/m2  SpO2 100%  Physical Exam: Pleasant male in no acute distress Lungs:  Clear  Cardiac:  Regular rhythm, normal S1 and S2, no S3, irregular beats noted, harsh 2 to 3/6 systolic murmur of aortic stenosis late peaking  Abdomen:  Soft, nontender, no masses Extremities:  No edema present, Site clean and dry.    Intake/Output from previous day: 08/27 0701 - 08/28 0700 In: 1092 [P.O.:1092] Out: -  Weight Filed Weights   07/22/15 1120 07/23/15 0446 07/24/15 0434  Weight: 92.4 kg (203 lb 11.3 oz) 92.9 kg (204 lb 12.9 oz) 94.257 kg (207 lb 12.8 oz)   Lab Results: Basic Metabolic Panel:  Recent Labs  07/22/15 0820 07/23/15 0321  NA 134* 134*  K 4.0 4.1  CL 95* 95*  CO2 28 28  GLUCOSE 154* 149*  BUN 33* 25*  CREATININE 4.23* 3.96*    CBC:  Recent Labs  07/22/15 0820 07/23/15 0321  WBC 10.6* 11.1*  HGB 8.4* 8.5*  HCT 25.3* 26.0*  MCV 93.7 94.2  PLT 345 357    BNP    Component Value Date/Time   BNP 2856.0* 07/18/2015 0209    PROTIME: Lab Results  Component Value Date   INR 1.28 07/22/2015   INR 1.44 07/11/2015   INR 1.19 04/27/2015    Telemetry: Sinus rhythm some atrial paced with ventricular paced beats  Assessment/Plan:  1.  Severe aortic stenosis currently being evaluated for TAVR-some dyspnea this morning 2.  Coronary artery disease at catheterization stable with chronic total occlusion of the right coronary artery with moderate LAD disease 3.  End-stage renal disease on dialysis  Recommendations:  We'll try to get imaging study done tomorrow to assess for TAVR.  Dr. Burt Knack to follow-up tomorrow about  timing.  He can dialyze tomorrow and then arrange dialysis as outpatient.     Kerry Hough  MD [facility]  07/24/2015, 10:15 AM

## 2015-07-24 NOTE — Progress Notes (Signed)
  [facility] Progress Note  Assessment: 1 Pulm edema , resolved 2 ESRD new lower dry wt, mwf hd 3 Severe AS per cards 4 Anemia fe, esa 5 SP pacemaker 6 DM  Plan - kept for more xrays he needs prior to TAVR. Plan HD Monday  Jeffrey Splinter MD (pgr) [phone]    ([phone] 07/24/2015, 2:48 PM   Subjective: stable  Objective Filed Vitals:   07/23/15 1500 07/23/15 2058 07/24/15 0100 07/24/15 0434  BP: 94/34 103/42 93/67 127/71  Pulse: 109 77 76 92  Temp: 97.8 F (36.6 C) 98 F (36.7 C) 98.3 F (36.8 C) 98.1 F (36.7 C)  TempSrc: Oral Oral Oral Oral  Resp: 20 20 20 20   Height:      Weight:    94.257 kg (207 lb 12.8 oz)  SpO2: 87% 98% 98% 100%   Physical Exam goal 3.5 on HD  General: NAD supine breathing easily Heart:irreg irreg 3/6 murmur Lungs: no rales Abdomen: soft NT Extremities: no edema Dialysis Access: left upper AVF  Dialysis Orders: [facility] MWF 4hr, BFR 400, 2K 2Ca, DW 97.5 heparin none Mircera 100 q 2 Venofer 50/wk  Additional Objective Labs: Basic Metabolic Panel:  Recent Labs Lab 07/19/15 2025 07/20/15 0310 07/22/15 0820 07/23/15 0321  NA 135 133* 134* 134*  K 4.5 4.5 4.0 4.1  CL 96* 92* 95* 95*  CO2 27 27 28 28   GLUCOSE 176* 167* 154* 149*  BUN 44* 52* 33* 25*  CREATININE 5.16* 5.72* 4.23* 3.96*  CALCIUM 9.3 9.3 8.6* 8.5*  PHOS 4.3  --   --   --    Liver Function Tests:  Recent Labs Lab 07/19/15 2025  ALBUMIN 2.9*   CBC:  Recent Labs Lab 07/18/15 0835 07/19/15 0241 07/20/15 0310 07/22/15 0820 07/23/15 0321  WBC 9.6 10.0 10.9* 10.6* 11.1*  HGB 7.9* 8.1* 9.0* 8.4* 8.5*  HCT 23.4* 24.9* 27.6* 25.3* 26.0*  MCV 92.9 92.9 92.6 93.7 94.2  PLT 302 293 332 345 357  Cardiac Enzymes:  Recent Labs Lab 07/18/15 0301 07/18/15 0401 07/18/15 1039 07/18/15 1603  TROPONINI 0.72* 0.73* 0.82* 0.64*   CBG:  Recent Labs Lab 07/23/15 1113 07/23/15 1612 07/23/15 2101 07/24/15 0709 07/24/15 1114  GLUCAP 132* 136*  187* 154* 219*  Medications:   . antiseptic oral rinse  7 mL Mouth Rinse BID  . aspirin  325 mg Oral Daily  . calcium carbonate  3 tablet Oral TID WC  . clopidogrel  75 mg Oral Daily  . darbepoetin (ARANESP) injection - DIALYSIS  100 mcg Intravenous Q Mon-HD  . ferric gluconate (FERRLECIT/NULECIT) IV  62.5 mg Intravenous Q Fri-HD  . heparin  5,000 Units Subcutaneous 3 times per day  . insulin aspart  0-9 Units Subcutaneous TID WC  . metoprolol tartrate  12.5 mg Oral BID  . midodrine  10 mg Oral TID WC  . multivitamin  1 tablet Oral QHS  . sodium chloride  3 mL Intravenous Q12H  . sodium chloride  3 mL Intravenous Q12H  . sodium chloride  3 mL Intravenous Q12H

## 2015-07-24 NOTE — Progress Notes (Signed)
Patient refused CPAP for tonight. RT informed Pt to call if he changes his mind.

## 2015-07-24 NOTE — Progress Notes (Signed)
Patient Demographics:    Jeffrey [name], is a 74 y.o. male, DOB - [birth year], ZN:[id number]  Admit date - 07/18/2015   Admitting Physician Theressa Millard, MD  Outpatient Primary MD for the patient is Thurman Coyer, MD  LOS - 6   Chief Complaint  Patient presents with  . Chest Pain  . Shortness of Breath        Subjective:    Jeffrey [name] today has, No headache, No chest pain, No abdominal pain - No Nausea, No new weakness tingling or numbness, No Cough - improved shortness of breath, had some problems with oxygen tube on 07/24/2015 morning.   Assessment  & Plan :     Acute respiratory failure with hypoxia due to acute on chronic diastolic CHF with EF [id number] underlying severe aortic stenosis in a patient with ESRD.  Dialysis for fluid removal, renal and cardiology on board, blood pressure borderline so admitted to drain to augment fluid removal during dialysis procedures. Further management per cardiology and renal. Supportive care with oxygen nebulizer treatment to continue. Beta blocker as tolerated by blood pressure.    Severe aortic stenosis. Cardiology on board, looking into TAV R procedure, likely Monday ?, Appears quite frail and simply best option will be to complete the procedure and then discharge .   Chest pain in a setting of established coronary artery disease/Pericarditis  Likely due to pericarditis-->conservative management per cardiology, continue aspirin and Plavix.Elevated troponin due to demand ischemia in the setting of ESRD and decompensated CHF. L heart Cath on 07-22-15 stable as below.    Paroxysmal atrial fibrillation [place] VASC 2 score - 2. Patient is going in and out of atrial fibrillation during this admission , on aspirin and Plavix. Cardiology on board different" to  cardiology. Per cardiology poor candidate for anticoagulation.   Incidental L Adrenal and R Kidney lesions CT scan ordered by cardiology - follow with PCP age appropriate outpatient workup.   Hypertension - BP currently soft, low-dose beta blocker if tolerated.   Hyperlipidemia - on home dose statin   Tic removal night of 07/19/2015. Unknown duration of tick exposure, given prophylaxis with doxycycline 200 mg 1 dose, Lyme serology -ve and [facility] serology IgM -ve IgG +ve, D/W ID - Dr Dairl Ponder,  this likely is past infection and since asymptomatic no further treatment..   Diabetes mellitus type 2 - last A1c on 04/27/2015 6.7. Currently on sliding scale along with low-dose Levemir will monitor.   CBG (last 3)   Recent Labs  07/23/15 1612 07/23/15 2101 07/24/15 0709  GLUCAP 136* 187* 154*       Code Status : Full  Family Communication  : Family bedside  Disposition Plan  : TBD  Consults  :  Cards, Renal ID Dr. De Burrs over the phone  Procedures  :   CT Angela chest abdomen pelvis ordered by cardiology for possible TAVR procedure  L Heart Cath -   Mid RCA lesion, 100% stenosed with distal vessel filling from left to right collaterals.  Prox Cx lesion, 20% stenosed.  Mid Cx lesion, 30% stenosed.  3rd Mrg lesion, 20% stenosed.  Mid LAD lesion, 50% stenosed.  Ost 2nd Diag lesion, 40% stenosed.  1. 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 This does not appear to be flow limiting. Chronic occlusion mid RCA with filling of the distal vessel from left to right collaterals.  2. Severe aortic stenosis by echo and cardiac cath in November. I did not cross the aortic valve today.   Recommendations: Continue planning for AVR vs TAVR. He will not need coronary intervention before the aortic valve procedure. He may be able to be discharged this weekend and have follow up in the valve clinic to finalize planning for the  aortic valve procedure.   DVT Prophylaxis  :  Heparin   Lab Results  Component Value Date   PLT 357 07/23/2015    Inpatient Medications  Scheduled Meds: . antiseptic oral rinse  7 mL Mouth Rinse BID  . aspirin  325 mg Oral Daily  . calcium carbonate  3 tablet Oral TID WC  . clopidogrel  75 mg Oral Daily  . darbepoetin (ARANESP) injection - DIALYSIS  100 mcg Intravenous Q Mon-HD  . ferric gluconate (FERRLECIT/NULECIT) IV  62.5 mg Intravenous Q Fri-HD  . heparin  5,000 Units Subcutaneous 3 times per day  . insulin aspart  0-9 Units Subcutaneous TID WC  . metoprolol tartrate  12.5 mg Oral BID  . midodrine  10 mg Oral TID WC  . multivitamin  1 tablet Oral QHS  . sodium chloride  3 mL Intravenous Q12H  . sodium chloride  3 mL Intravenous Q12H  . sodium chloride  3 mL Intravenous Q12H   Continuous Infusions:   PRN Meds:.sodium chloride, sodium chloride, sodium chloride, acetaminophen **OR** acetaminophen, albuterol, HYDROcodone-homatropine, HYDROmorphone (DILAUDID) injection, nitroGLYCERIN, ondansetron **OR** ondansetron (ZOFRAN) IV, sodium chloride, sodium chloride, sodium chloride  Antibiotics  :    Anti-infectives    Start     Dose/Rate Route Frequency Ordered Stop   07/21/15 1400  doxycycline (VIBRA-TABS) tablet 200 mg     200 mg Oral  Once 07/21/15 1027 07/21/15 1421   07/19/15 2200  doxycycline (VIBRA-TABS) tablet 100 mg  Status:  Discontinued     100 mg Oral Every 12 hours 07/19/15 2129 07/21/15 1027        Objective:   Filed Vitals:   07/23/15 1500 07/23/15 2058 07/24/15 0100 07/24/15 0434  BP: 94/34 103/42 93/67 127/71  Pulse: 109 77 76 92  Temp: 97.8 F (36.6 C) 98 F (36.7 C) 98.3 F (36.8 C) 98.1 F (36.7 C)  TempSrc: Oral Oral Oral Oral  Resp: 20 20 20 20   Height:      Weight:    94.257 kg (207 lb 12.8 oz)  SpO2: 87% 98% 98% 100%    Wt Readings from Last 3 Encounters:  07/24/15 94.257 kg (207 lb 12.8 oz)  07/12/15 99.4 kg (219 lb 2.2 oz)  07/07/15  100.608 kg (221 lb 12.8 oz)     Intake/Output Summary (Last 24 hours) at 07/24/15 1039 Last data filed at 07/24/15 0856  Gross per 24 hour  Intake    972 ml  Output     25 ml  Net    947 ml     Physical Exam  Awake Alert, Oriented X 3, No new F.N deficits, Normal affect [place].AT,PERRAL Supple Neck,No JVD, No cervical lymphadenopathy appriciated.  Symmetrical Chest wall movement, Good air movement bilaterally, basilar wheezing RRR,No Gallops,Rubs, loud aortic systolic Murmur, No Parasternal Heave +ve B.Sounds, Abd Soft, No tenderness, No organomegaly appriciated, No rebound - guarding or rigidity. No Cyanosis, Clubbing or edema, No new Rash  or bruise       Data Review:   Micro Results No results found for this or any previous visit (from the past 240 hour(s)).  Radiology Reports Dg Chest 2 View  07/18/2015   CLINICAL DATA:  Cough, chest pain and shortness of breath tonight. Recent pacemaker placement.  EXAM: CHEST  2 VIEW  COMPARISON:  07/12/2015  FINDINGS: Dual lead right-sided pacemaker, leads intact. No pneumothorax. Cardiomegaly is unchanged. Increased perivascular haziness suggestive of pulmonary edema. Probable bilateral pleural effusions with blunting of the costophrenic angles and associated bibasilar atelectasis. No acute osseous abnormalities are seen.  IMPRESSION: Small pleural effusions and perivascular haziness suggestive of pulmonary edema. Recommend correlation for CHF.   Electronically Signed   By: Jeb Levering M.D.   On: 07/18/2015 02:08   Dg Chest 2 View  07/12/2015   CLINICAL DATA:  Status post pacemaker placement, low right shoulder pain  EXAM: CHEST - 2 VIEW  COMPARISON:  04/27/2015  FINDINGS: Cardiac shadow is stable. Lungs are well aerated bilaterally. No focal infiltrate or sizable effusion is noted. Mild central vascular congestion is noted without interstitial edema. A pacing device is now seen. No pneumothorax is noted. No bony abnormality is seen.   IMPRESSION: Mild vascular congestion.  Status post pacemaker without pneumothorax.   Electronically Signed   By: Inez Catalina M.D.   On: 07/12/2015 07:41   Ct Angio Chest Aorta W/cm &/or Wo/cm  07/23/2015   CLINICAL DATA:  74 year old male with history of severe aortic stenosis. Preprocedural study prior to potential transcatheter aortic valve replacement (TAVR).  EXAM: CT ANGIOGRAPHY CHEST, ABDOMEN AND PELVIS  TECHNIQUE: Multidetector CT imaging through the chest, abdomen and pelvis was performed using the standard protocol during bolus administration of intravenous contrast. Multiplanar reconstructed images and MIPs were obtained and reviewed to evaluate the vascular anatomy.  CONTRAST:  119mL OMNIPAQUE IOHEXOL 350 MG/ML SOLN  COMPARISON:  No priors.  FINDINGS: CTA CHEST FINDINGS  Mediastinum/Lymph Nodes: Heart size is enlarged. There is no significant pericardial fluid, thickening or pericardial calcification. There is atherosclerosis of the thoracic aorta, the great vessels of the mediastinum and the coronary arteries, including calcified atherosclerotic plaque in the left main, left anterior descending, left circumflex and right coronary arteries. Severe thickening and calcification of the aortic valve. Calcifications of the mitral valve and mitral annulus. 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 Dilatation of the pulmonic trunk (4.8 cm in diameter), suggestive of pulmonary arterial hypertension. Multiple borderline enlarged mediastinal lymph nodes measuring up to 9 mm in short axis. No definite pathologically enlarged mediastinal or hilar lymph nodes. Esophagus is unremarkable in appearance. No axillary lymphadenopathy.  Lungs/Pleura: Moderate bilateral pleural effusions layering dependently with extensive passive atelectasis in the dependent portions of the lower lobes of the lungs bilaterally. Mild diffuse ground-glass  attenuation and interlobular septal thickening in the lungs suggestive of a background of mild interstitial pulmonary edema. No acute consolidative airspace disease. No suspicious appearing pulmonary nodule or mass.  Musculoskeletal/Soft Tissues: There are no aggressive appearing lytic or blastic lesions noted in the visualized portions of the skeleton.  CTA ABDOMEN AND PELVIS FINDINGS  Hepatobiliary: No cystic or solid hepatic lesions. No intra or extrahepatic biliary ductal dilatation. Status post cholecystectomy.  Pancreas: No pancreatic mass. No pancreatic ductal dilatation. No pancreatic or peripancreatic fluid or inflammatory changes.  Spleen: Unremarkable.  Adrenals/Urinary Tract: 2.2 cm left adrenal nodule is indeterminate. Right adrenal gland is  normal in appearance. Exophytic 3.5 cm simple cyst in the posterior aspect of the interpolar region of the left kidney. In the lateral interpolar region of the right kidney there is a 1.7 cm high attenuation (74 HU) lesion which could represent an enhancing lesion or a proteinaceous/hemorrhagic cyst. No hydroureteronephrosis. Urinary bladder is remarkable for a 5 cm diverticulum in the posterior aspect of the urinary bladder on the left side.  Stomach/Bowel: The appearance of the stomach is normal. Several diverticulae of the duodenum are noted. No surrounding inflammatory changes to suggest associated duodenal diverticulitis. No pathologic dilatation of small bowel or colon. Numerous colonic diverticulae are noted, without surrounding inflammatory changes to suggest an acute diverticulitis at this time. Normal appendix.  Vascular/Lymphatic: Vascular findings and measurements pertinent to potential TAVR procedure, as detailed below. No aneurysm or dissection identified. No lymphadenopathy noted in the abdomen or pelvis.  Reproductive: Prostate gland and seminal vesicles are unremarkable in appearance.  Other: Small bilateral inguinal hernias containing only fat  incidentally noted. No significant volume of ascites. No pneumoperitoneum.  Musculoskeletal: Chronic appearing compression fracture of superior endplate of L1 with approximately 15% loss of anterior vertebral body height. There are no aggressive appearing lytic or blastic lesions noted in the visualized portions of the skeleton.  VASCULAR MEASUREMENTS PERTINENT TO TAVR:  AORTA:  Minimal Aortic Diameter -  16 x 16 mm  Severity of Aortic Calcification -  severe  RIGHT PELVIS:  Right Common Iliac Artery -  Minimal Diameter - 9.5 x 8.1 mm  Tortuosity - mild  Calcification - moderate  Right External Iliac Artery -  Minimal Diameter - 8.7 x 6.9 mm  Tortuosity - mild  Calcification - mild  Right Common Femoral Artery -  Minimal Diameter - Proximal 9.0 x 7.8 mm; Distal 8.6 x 3.6 mm  Tortuosity - mild  Calcification - moderate proximally, severe distally  LEFT PELVIS:  Left Common Iliac Artery -  Minimal Diameter - 11.6 x 10.0 mm  Tortuosity - mild  Calcification - moderate  Left External Iliac Artery -  Minimal Diameter - 8.8 x 7.8 mm  Tortuosity - mild  Calcification - mild  Left Common Femoral Artery -  Minimal Diameter - Proximal 7.0 x 6.1 mm; Distal 6.5 x 5.8 mm  Tortuosity - mild  Calcification - moderate proximally, severe distally  Review of the MIP images confirms the above findings.  IMPRESSION: 1. Vascular findings and measurements pertinent to potential TAVR procedure, as detailed above. This patient does appear to have suitable pelvic arterial access bilaterally, particularly if vascular access is achieved in the proximal common femoral arteries (distal common femoral arteries are not suitable for vascular access). 2. Severe thickening calcifications of the aortic valve, compatible with the reported clinical history of aortic stenosis. 3. Severe dilatation of the pulmonic trunk and main pulmonary arteries, suggestive of pulmonary arterial hypertension. 4. Cardiomegaly with evidence of mild interstitial pulmonary  edema and moderate bilateral pleural effusions, suggesting congestive heart failure. Numerous borderline enlarged mediastinal lymph nodes are likely secondary to underlying congestive heart failure. 5. 2.2 cm left adrenal nodule is indeterminate. Statistically, this is likely to represent a small adenoma, and could be further characterized with nonemergent noncontrast CT the abdomen. 6. 1.7 cm high attenuation lesion in the interpolar region of the right kidney may represent either a proteinaceous/hemorrhagic cyst or a solid enhancing lesion. Ideally, this would be characterized by MRI, however, given the patient's pacemaker, further evaluation with nonemergent renal ultrasound is strongly recommended at this  time. Alternatively, if this lesion is of similar high attenuation on the followup noncontrast CT the abdomen (recommended for number 6 above), no further characterization may be needed. 7. Additional incidental findings, as above.   Electronically Signed   By: Vinnie Langton M.D.   On: 07/23/2015 16:01   Ct Angio Abd/pel W/ And/or W/o  07/23/2015   CLINICAL DATA:  74 year old male with history of severe aortic stenosis. Preprocedural study prior to potential transcatheter aortic valve replacement (TAVR).  EXAM: CT ANGIOGRAPHY CHEST, ABDOMEN AND PELVIS  TECHNIQUE: Multidetector CT imaging through the chest, abdomen and pelvis was performed using the standard protocol during bolus administration of intravenous contrast. Multiplanar reconstructed images and MIPs were obtained and reviewed to evaluate the vascular anatomy.  CONTRAST:  150mL OMNIPAQUE IOHEXOL 350 MG/ML SOLN  COMPARISON:  No priors.  FINDINGS: CTA CHEST FINDINGS  Mediastinum/Lymph Nodes: Heart size is enlarged. There is no significant pericardial fluid, thickening or pericardial calcification. There is atherosclerosis of the thoracic aorta, the great vessels of the mediastinum and the coronary arteries, including calcified atherosclerotic  plaque in the left main, left anterior descending, left circumflex and right coronary arteries. Severe thickening and calcification of the aortic valve. Calcifications of the mitral valve and mitral annulus. 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 Dilatation of the pulmonic trunk (4.8 cm in diameter), suggestive of pulmonary arterial hypertension. Multiple borderline enlarged mediastinal lymph nodes measuring up to 9 mm in short axis. No definite pathologically enlarged mediastinal or hilar lymph nodes. Esophagus is unremarkable in appearance. No axillary lymphadenopathy.  Lungs/Pleura: Moderate bilateral pleural effusions layering dependently with extensive passive atelectasis in the dependent portions of the lower lobes of the lungs bilaterally. Mild diffuse ground-glass attenuation and interlobular septal thickening in the lungs suggestive of a background of mild interstitial pulmonary edema. No acute consolidative airspace disease. No suspicious appearing pulmonary nodule or mass.  Musculoskeletal/Soft Tissues: There are no aggressive appearing lytic or blastic lesions noted in the visualized portions of the skeleton.  CTA ABDOMEN AND PELVIS FINDINGS  Hepatobiliary: No cystic or solid hepatic lesions. No intra or extrahepatic biliary ductal dilatation. Status post cholecystectomy.  Pancreas: No pancreatic mass. No pancreatic ductal dilatation. No pancreatic or peripancreatic fluid or inflammatory changes.  Spleen: Unremarkable.  Adrenals/Urinary Tract: 2.2 cm left adrenal nodule is indeterminate. Right adrenal gland is normal in appearance. Exophytic 3.5 cm simple cyst in the posterior aspect of the interpolar region of the left kidney. In the lateral interpolar region of the right kidney there is a 1.7 cm high attenuation (74 HU) lesion which could represent an enhancing lesion or a proteinaceous/hemorrhagic cyst. No  hydroureteronephrosis. Urinary bladder is remarkable for a 5 cm diverticulum in the posterior aspect of the urinary bladder on the left side.  Stomach/Bowel: The appearance of the stomach is normal. Several diverticulae of the duodenum are noted. No surrounding inflammatory changes to suggest associated duodenal diverticulitis. No pathologic dilatation of small bowel or colon. Numerous colonic diverticulae are noted, without surrounding inflammatory changes to suggest an acute diverticulitis at this time. Normal appendix.  Vascular/Lymphatic: Vascular findings and measurements pertinent to potential TAVR procedure, as detailed below. No aneurysm or dissection identified. No lymphadenopathy noted in the abdomen or pelvis.  Reproductive: Prostate gland and seminal vesicles are unremarkable in appearance.  Other: Small bilateral inguinal hernias containing only fat incidentally noted. No significant volume of ascites. No pneumoperitoneum.  Musculoskeletal: Chronic appearing compression fracture of  superior endplate of L1 with approximately 15% loss of anterior vertebral body height. There are no aggressive appearing lytic or blastic lesions noted in the visualized portions of the skeleton.  VASCULAR MEASUREMENTS PERTINENT TO TAVR:  AORTA:  Minimal Aortic Diameter -  16 x 16 mm  Severity of Aortic Calcification -  severe  RIGHT PELVIS:  Right Common Iliac Artery -  Minimal Diameter - 9.5 x 8.1 mm  Tortuosity - mild  Calcification - moderate  Right External Iliac Artery -  Minimal Diameter - 8.7 x 6.9 mm  Tortuosity - mild  Calcification - mild  Right Common Femoral Artery -  Minimal Diameter - Proximal 9.0 x 7.8 mm; Distal 8.6 x 3.6 mm  Tortuosity - mild  Calcification - moderate proximally, severe distally  LEFT PELVIS:  Left Common Iliac Artery -  Minimal Diameter - 11.6 x 10.0 mm  Tortuosity - mild  Calcification - moderate  Left External Iliac Artery -  Minimal Diameter - 8.8 x 7.8 mm  Tortuosity - mild   Calcification - mild  Left Common Femoral Artery -  Minimal Diameter - Proximal 7.0 x 6.1 mm; Distal 6.5 x 5.8 mm  Tortuosity - mild  Calcification - moderate proximally, severe distally  Review of the MIP images confirms the above findings.  IMPRESSION: 1. Vascular findings and measurements pertinent to potential TAVR procedure, as detailed above. This patient does appear to have suitable pelvic arterial access bilaterally, particularly if vascular access is achieved in the proximal common femoral arteries (distal common femoral arteries are not suitable for vascular access). 2. Severe thickening calcifications of the aortic valve, compatible with the reported clinical history of aortic stenosis. 3. Severe dilatation of the pulmonic trunk and main pulmonary arteries, suggestive of pulmonary arterial hypertension. 4. Cardiomegaly with evidence of mild interstitial pulmonary edema and moderate bilateral pleural effusions, suggesting congestive heart failure. Numerous borderline enlarged mediastinal lymph nodes are likely secondary to underlying congestive heart failure. 5. 2.2 cm left adrenal nodule is indeterminate. Statistically, this is likely to represent a small adenoma, and could be further characterized with nonemergent noncontrast CT the abdomen. 6. 1.7 cm high attenuation lesion in the interpolar region of the right kidney may represent either a proteinaceous/hemorrhagic cyst or a solid enhancing lesion. Ideally, this would be characterized by MRI, however, given the patient's pacemaker, further evaluation with nonemergent renal ultrasound is strongly recommended at this time. Alternatively, if this lesion is of similar high attenuation on the followup noncontrast CT the abdomen (recommended for number 6 above), no further characterization may be needed. 7. Additional incidental findings, as above.   Electronically Signed   By: Vinnie Langton M.D.   On: 07/23/2015 16:01     CBC  Recent Labs Lab  07/18/15 0835 07/19/15 0241 07/20/15 0310 07/22/15 0820 07/23/15 0321  WBC 9.6 10.0 10.9* 10.6* 11.1*  HGB 7.9* 8.1* 9.0* 8.4* 8.5*  HCT 23.4* 24.9* 27.6* 25.3* 26.0*  PLT 302 293 332 345 357  MCV 92.9 92.9 92.6 93.7 94.2  MCH 31.3 30.2 30.2 31.1 30.8  MCHC 33.8 32.5 32.6 33.2 32.7  RDW 14.4 14.6 14.8 15.1 15.4    Chemistries   Recent Labs Lab 07/19/15 0241 07/19/15 2025 07/20/15 0310 07/22/15 0820 07/23/15 0321  NA 134* 135 133* 134* 134*  K 4.3 4.5 4.5 4.0 4.1  CL 95* 96* 92* 95* 95*  CO2 28 27 27 28 28   GLUCOSE 159* 176* 167* 154* 149*  BUN 34* 44* 52* 33* 25*  CREATININE 4.32* 5.16* 5.72* 4.23* 3.96*  CALCIUM 8.5* 9.3 9.3 8.6* 8.5*   ------------------------------------------------------------------------------------------------------------------ estimated creatinine clearance is 18.6 mL/min (by C-G formula based on Cr of 3.96). ------------------------------------------------------------------------------------------------------------------ No results for input(s): HGBA1C in the last 72 hours. ------------------------------------------------------------------------------------------------------------------ No results for input(s): CHOL, HDL, LDLCALC, TRIG, CHOLHDL, LDLDIRECT in the last 72 hours. ------------------------------------------------------------------------------------------------------------------ No results for input(s): TSH, T4TOTAL, T3FREE, THYROIDAB in the last 72 hours.  Invalid input(s): FREET3 ------------------------------------------------------------------------------------------------------------------ No results for input(s): VITAMINB12, FOLATE, FERRITIN, TIBC, IRON, RETICCTPCT in the last 72 hours.  Coagulation profile  Recent Labs Lab 07/22/15 0720  INR 1.28    No results for input(s): DDIMER in the last 72 hours.  Cardiac Enzymes  Recent Labs Lab 07/18/15 0401 07/18/15 1039 07/18/15 1603  TROPONINI 0.73* 0.82* 0.64*    ------------------------------------------------------------------------------------------------------------------ Invalid input(s): POCBNP   Time Spent in minutes  35   Jacarius Handel K M.D on 07/24/2015 at 10:39 AM  Between 7am to 7pm - Pager - [phone]  After 7pm go to [email or web address] - password [facility]  [facility] -  Office  [phone]

## 2015-07-25 ENCOUNTER — Inpatient Hospital Stay (HOSPITAL_COMMUNITY): Payer: Medicare Other

## 2015-07-25 ENCOUNTER — Encounter (HOSPITAL_COMMUNITY): Payer: Self-pay | Admitting: Cardiovascular Disease

## 2015-07-25 DIAGNOSIS — I350 Nonrheumatic aortic (valve) stenosis: Secondary | ICD-10-CM | POA: Insufficient documentation

## 2015-07-25 LAB — RENAL FUNCTION PANEL
Albumin: 3.3 g/dL — ABNORMAL LOW (ref 3.5–5.0)
Anion gap: 17 — ABNORMAL HIGH (ref 5–15)
BUN: 64 mg/dL — ABNORMAL HIGH (ref 6–20)
CALCIUM: 9.3 mg/dL (ref 8.9–10.3)
CO2: 23 mmol/L (ref 22–32)
CREATININE: 9.52 mg/dL — AB (ref 0.61–1.24)
Chloride: 92 mmol/L — ABNORMAL LOW (ref 101–111)
GFR, EST AFRICAN AMERICAN: 5 mL/min — AB (ref >60)
GFR, EST NON AFRICAN AMERICAN: 5 mL/min — AB (ref >60)
Glucose, Bld: 176 mg/dL — ABNORMAL HIGH (ref 65–99)
Phosphorus: 6.8 mg/dL — ABNORMAL HIGH (ref 2.5–4.6)
Potassium: 5.4 mmol/L — ABNORMAL HIGH (ref 3.5–5.1)
SODIUM: 132 mmol/L — AB (ref 135–145)

## 2015-07-25 LAB — GLUCOSE, CAPILLARY
GLUCOSE-CAPILLARY: 188 mg/dL — AB (ref 65–99)
GLUCOSE-CAPILLARY: 158 mg/dL — AB (ref 65–99)
GLUCOSE-CAPILLARY: 164 mg/dL — AB (ref 65–99)
Glucose-Capillary: 119 mg/dL — ABNORMAL HIGH (ref 65–99)
Glucose-Capillary: 165 mg/dL — ABNORMAL HIGH (ref 65–99)

## 2015-07-25 MED ORDER — PENTAFLUOROPROP-TETRAFLUOROETH EX AERO: 1.0000 "application " | INHALATION_SPRAY | CUTANEOUS | Status: DC | PRN

## 2015-07-25 MED ORDER — SODIUM CHLORIDE 0.9 % IV SOLN: 100.0000 mL | INTRAVENOUS | Status: DC | PRN

## 2015-07-25 MED ORDER — LIDOCAINE-PRILOCAINE 2.5-2.5 % EX CREA
1.0000 "application " | TOPICAL_CREAM | CUTANEOUS | Status: DC | PRN
  Filled 2015-07-25: qty 5

## 2015-07-25 MED ORDER — HEPARIN SODIUM (PORCINE) 1000 UNIT/ML DIALYSIS
1000.0000 [IU] | INTRAMUSCULAR | Status: DC | PRN
  Filled 2015-07-25: qty 1

## 2015-07-25 MED ORDER — METOPROLOL TARTRATE 1 MG/ML IV SOLN
5.0000 mg | INTRAVENOUS | Status: DC | PRN
  Administered 2015-07-25 (×2): 5 mg via INTRAVENOUS

## 2015-07-25 MED ORDER — NEPRO/CARBSTEADY PO LIQD: 237.0000 mL | ORAL | Status: DC | PRN

## 2015-07-25 MED ORDER — ALTEPLASE 2 MG IJ SOLR
2.0000 mg | Freq: Once | INTRAMUSCULAR | Status: DC | PRN
  Filled 2015-07-25: qty 2

## 2015-07-25 MED ORDER — IOHEXOL 350 MG/ML SOLN
100.0000 mL | Freq: Once | INTRAVENOUS | Status: AC | PRN
  Administered 2015-07-25: 100 mL via INTRAVENOUS

## 2015-07-25 MED ORDER — LIDOCAINE HCL (PF) 1 % IJ SOLN: 5.0000 mL | INTRAMUSCULAR | Status: DC | PRN

## 2015-07-25 MED FILL — Nitroglycerin IV Soln 100 MCG/ML in D5W: INTRA_ARTERIAL | Qty: 10 | Status: AC

## 2015-07-25 NOTE — Progress Notes (Signed)
SUBJECTIVE:  The patient had a very uncomfortable night.  He reports chest pain off and on.  He has had intermittent SOB.     PHYSICAL EXAM Filed Vitals:   07/24/15 0434 07/24/15 2041 07/25/15 0120 07/25/15 0610  BP: 127/71 104/39  122/62  Pulse: 92 69 78 83  Temp: 98.1 F (36.7 C) 97.6 F (36.4 C)  97.7 F (36.5 C)  TempSrc: Oral Oral  Oral  Resp: 20 20 21 22   Height:      Weight: 207 lb 12.8 oz (94.257 kg)   210 lb (95.255 kg)  SpO2: 100% 95% 96% 98%   General:  No acute distress but chronically ill Lungs:  Decreased breath sounds at the bases Heart:  RRR, murmur Abdomen:  Positive bowel sounds, no rebound no guarding Extremities:  No edema  Neuro:  Nonfocal  LABS:  Results for orders placed or performed during the hospital encounter of 07/18/15 (from the past 24 hour(s))  Glucose, capillary     Status: Abnormal   Collection Time: 07/24/15 11:14 AM  Result Value Ref Range   Glucose-Capillary 219 (H) 65 - 99 mg/dL   Comment 1 Notify RN   Glucose, capillary     Status: Abnormal   Collection Time: 07/24/15  4:44 PM  Result Value Ref Range   Glucose-Capillary 153 (H) 65 - 99 mg/dL   Comment 1 Notify RN    Comment 2 Document in Chart   Glucose, capillary     Status: Abnormal   Collection Time: 07/24/15  9:21 PM  Result Value Ref Range   Glucose-Capillary 157 (H) 65 - 99 mg/dL   Comment 1 Notify RN   Glucose, capillary     Status: Abnormal   Collection Time: 07/25/15  6:52 AM  Result Value Ref Range   Glucose-Capillary 188 (H) 65 - 99 mg/dL    Intake/Output Summary (Last 24 hours) at 07/25/15 0914 Last data filed at 07/25/15 0853  Gross per 24 hour  Intake    700 ml  Output      0 ml  Net    700 ml    CT:  1. Vascular findings and measurements pertinent to potential TAVR procedure, as detailed above. This patient does appear to have suitable pelvic arterial access bilaterally, particularly if vascular access is achieved in the proximal common femoral  arteries (distal common femoral arteries are not suitable for vascular access). 2. Severe thickening calcifications of the aortic valve, compatible with the reported clinical history of aortic stenosis. 3. Severe dilatation of the pulmonic trunk and main pulmonary arteries, suggestive of pulmonary arterial hypertension. 4. Cardiomegaly with evidence of mild interstitial pulmonary edema and moderate bilateral pleural effusions, suggesting congestive heart failure. Numerous borderline enlarged mediastinal lymph nodes are likely secondary to underlying congestive heart failure. 5. 2.2 cm left adrenal nodule is indeterminate. Statistically, this is likely to represent a small adenoma, and could be further characterized with nonemergent noncontrast CT the abdomen. 6. 1.7 cm high attenuation lesion in the interpolar region of the right kidney may represent either a proteinaceous/hemorrhagic cyst or a solid enhancing lesion. Ideally, this would be characterized by MRI, however, given the patient's pacemaker, further evaluation with nonemergent renal ultrasound is strongly recommended at this time. Alternatively, if this lesion is of similar high attenuation on the followup noncontrast CT the abdomen (recommended for number 6 above), no further characterization may be needed. 7. Additional incidental findings, as above.  ASSESSMENT AND PLAN:  AS:  I spoke  with Dr. Burt Knack this morning.  With ongoing chest pain and intermittent acute dyspnea I do not think he would be able to stay out of the hospital prior to valve surgery.  He is going to get cardiac gated chest CT today and Dr. Meda Coffee is aware to help facilitate this.  Dr. Burt Knack will discuss further the timing of TAVR and approach with Dr. Roxy Manns.    CAD:   Medical management.  CAD stable since previous cath.    ACUTE ON CHRONIC RESPIRATORY FAILURE:  No acute distress but he feels uncomforable.    ESRD:  Dialysis MWF.   Volume management per  dialysis.    [name] [facility] 07/25/2015 9:14 AM

## 2015-07-25 NOTE — Progress Notes (Signed)
Pt refused HD today. Pt stated " I'm too weak and I dont have the energy after I had my CT scan today" I'll be all right, I want my dialysis tomorrow instead." Family members at bedside. On call nephrologist made aware of pt's refusal as well as informed the current results of K=5.4. Ok to be dialyzed tomorrow per on call nephrologist.

## 2015-07-25 NOTE — Procedures (Addendum)
Dialysis was not performed.  Jeffrey [name] W 07/25/2015, 1:26 PM

## 2015-07-25 NOTE — Progress Notes (Addendum)
Instructed to bladder scan pt and I&0 cath if >261ml.Bladder scan >249ml reading noted.  I&0 cath obtained 256ml   dark urine.  At 0830  Pt tolerated well.  Will continue to monitor.  Karie Kirks, Therapist, sports.

## 2015-07-25 NOTE — Progress Notes (Signed)
PT Cancellation Note  Patient Details Name: GEORGY STAPLE MRN: [id number] DOB: 1940-12-23   Cancelled Treatment:    Reason Eval/Treat Not Completed: Fatigue/lethargy limiting ability to participate (pt declined stating "I can't breathe" sats 97% on 6L with HR 95. Will attempt at later date)   Melford Aase 07/25/2015, 11:08 AM Elwyn Reach, [place]

## 2015-07-25 NOTE — Care Management Important Message (Signed)
Important Message  Patient Details  Name: Jeffrey [name] MRN: [id number] Date of Birth: Feb 11, 1941   Medicare Important Message Given:  Yes-third notification given    Pricilla Handler 07/25/2015, 3:30 PM

## 2015-07-25 NOTE — Progress Notes (Signed)
[facility] On Call Note  Jeffrey [name] declined to go for dialysis tonight stating too tired and didn't feel like it. Lab this evening shows K 5.4 so should be fine to wait until tomorrow AM.  [name], MD [place] Pager 07/25/2015, 9:02 PM

## 2015-07-25 NOTE — Progress Notes (Signed)
Patient Demographics:    Jeffrey [name], is a 74 y.o. male, DOB - [birth year], ZN:[id number]  Admit date - 07/18/2015   Admitting Physician Theressa Millard, MD  Outpatient Primary MD for the patient is Thurman Coyer, MD  LOS - 7   Chief Complaint  Patient presents with  . Chest Pain  . Shortness of Breath        Subjective:    Jeffrey [name] today has, No headache, No chest pain, No abdominal pain - No Nausea, No new weakness tingling or numbness, No Cough - improved shortness of breath, had some problems with oxygen tube on 07/24/2015 morning.   Assessment  & Plan :     Acute respiratory failure with hypoxia due to acute on chronic diastolic CHF with EF [id number] underlying severe aortic stenosis in a patient with ESRD.  Dialysis for fluid removal, renal and cardiology on board, blood pressure borderline so admitted to drain to augment fluid removal during dialysis procedures. Further management per cardiology and renal. Supportive care with oxygen nebulizer treatment to continue. Beta blocker as tolerated by blood pressure.    Severe aortic stenosis. Cardiology on board, looking into TAV R procedure, likely Monday ?, Appears quite frail and simply best option will be to complete the procedure and then discharge. Discussed with cardiology personally on 07/25/2015.   Chest pain in a setting of established coronary artery disease/Pericarditis  Likely due to pericarditis-->conservative management per cardiology, continue aspirin and Plavix.Elevated troponin due to demand ischemia in the setting of ESRD and decompensated CHF. L heart Cath on 07-22-15 stable as below.    Paroxysmal atrial fibrillation [place] VASC 2 score - 2. Patient is going in and out of atrial fibrillation during this admission , on  aspirin and Plavix. Cardiology on board will defer further management of cardiac issues to cardiology. Per cardiology poor candidate for anticoagulation.   Incidental L Adrenal and R Kidney lesions CT scan ordered by cardiology - follow with PCP age appropriate outpatient workup.   Hypertension - BP currently soft, low-dose beta blocker if tolerated.   Hyperlipidemia - on home dose statin   Tic removal night of 07/19/2015. Unknown duration of tick exposure, given prophylaxis with doxycycline 200 mg 1 dose, Lyme serology -ve and [facility] serology IgM -ve IgG +ve, D/W ID - Dr Dairl Ponder,  this likely is past infection and since asymptomatic no further treatment..   Diabetes mellitus type 2 - last A1c on 04/27/2015 6.7. Currently on sliding scale along with low-dose Levemir will monitor.   CBG (last 3)   Recent Labs  07/24/15 1644 07/24/15 2121 07/25/15 0652  GLUCAP 153* 157* 188*       Code Status : Full  Family Communication  : Family bedside  Disposition Plan  : TBD  Consults  :  Cards, Renal ID Dr. De Burrs over the phone  Procedures  :   CT Angela chest abdomen pelvis ordered by cardiology for possible TAVR procedure  L Heart Cath -   Mid RCA lesion, 100% stenosed with distal vessel filling from left to right collaterals.  Prox Cx lesion, 20% stenosed.  Mid Cx lesion, 30% stenosed.  3rd Mrg lesion, 20% stenosed.  Mid LAD lesion, 50% stenosed.  Ost 2nd Diag  lesion, 40% stenosed.  1. 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 This does not appear to be flow limiting. Chronic occlusion mid RCA with filling of the distal vessel from left to right collaterals.  2. Severe aortic stenosis by echo and cardiac cath in November. I did not cross the aortic valve today.   Recommendations: Continue planning for AVR vs TAVR. He will not need coronary intervention before the aortic valve procedure. He may be able to be  discharged this weekend and have follow up in the valve clinic to finalize planning for the aortic valve procedure.   DVT Prophylaxis  :  Heparin   Lab Results  Component Value Date   PLT 357 07/23/2015    Inpatient Medications  Scheduled Meds: . antiseptic oral rinse  7 mL Mouth Rinse BID  . aspirin  325 mg Oral Daily  . calcium carbonate  3 tablet Oral TID WC  . clopidogrel  75 mg Oral Daily  . darbepoetin (ARANESP) injection - DIALYSIS  100 mcg Intravenous Q Mon-HD  . ferric gluconate (FERRLECIT/NULECIT) IV  62.5 mg Intravenous Q Fri-HD  . heparin  5,000 Units Subcutaneous 3 times per day  . insulin aspart  0-9 Units Subcutaneous TID WC  . metoprolol tartrate  12.5 mg Oral BID  . midodrine  10 mg Oral TID WC  . multivitamin  1 tablet Oral QHS  . sodium chloride  3 mL Intravenous Q12H  . sodium chloride  3 mL Intravenous Q12H  . sodium chloride  3 mL Intravenous Q12H   Continuous Infusions:   PRN Meds:.sodium chloride, sodium chloride, sodium chloride, acetaminophen **OR** acetaminophen, albuterol, HYDROcodone-homatropine, HYDROmorphone (DILAUDID) injection, nitroGLYCERIN, ondansetron **OR** ondansetron (ZOFRAN) IV, sodium chloride, sodium chloride, sodium chloride  Antibiotics  :    Anti-infectives    Start     Dose/Rate Route Frequency Ordered Stop   07/21/15 1400  doxycycline (VIBRA-TABS) tablet 200 mg     200 mg Oral  Once 07/21/15 1027 07/21/15 1421   07/19/15 2200  doxycycline (VIBRA-TABS) tablet 100 mg  Status:  Discontinued     100 mg Oral Every 12 hours 07/19/15 2129 07/21/15 1027        Objective:   Filed Vitals:   07/25/15 0120 07/25/15 0610 07/25/15 0914 07/25/15 0924  BP:  122/62 115/53   Pulse: 78 83 105   Temp:  97.7 F (36.5 C)  97.6 F (36.4 C)  TempSrc:  Oral  Oral  Resp: 21 22  20   Height:      Weight:  95.255 kg (210 lb)    SpO2: 96% 98%  95%    Wt Readings from Last 3 Encounters:  07/25/15 95.255 kg (210 lb)  07/12/15 99.4 kg (219 lb  2.2 oz)  07/07/15 100.608 kg (221 lb 12.8 oz)     Intake/Output Summary (Last 24 hours) at 07/25/15 1016 Last data filed at 07/25/15 0853  Gross per 24 hour  Intake    700 ml  Output    200 ml  Net    500 ml     Physical Exam  Awake Alert, Oriented X 3, No new F.N deficits, Normal affect Jeffrey [name],PERRAL Supple Neck,No JVD, No cervical lymphadenopathy appriciated.  Symmetrical Chest wall movement, Good air movement bilaterally, basilar wheezing RRR,No Gallops,Rubs, loud aortic systolic Murmur, No Parasternal Heave +ve B.Sounds, Abd Soft, No tenderness, No organomegaly appriciated, No rebound - guarding or rigidity. No Cyanosis, Clubbing or edema, No new Rash  or bruise       Data Review:   Micro Results No results found for this or any previous visit (from the past 240 hour(s)).  Radiology Reports Dg Chest 2 View  07/18/2015   CLINICAL DATA:  Cough, chest pain and shortness of breath tonight. Recent pacemaker placement.  EXAM: CHEST  2 VIEW  COMPARISON:  07/12/2015  FINDINGS: Dual lead right-sided pacemaker, leads intact. No pneumothorax. Cardiomegaly is unchanged. Increased perivascular haziness suggestive of pulmonary edema. Probable bilateral pleural effusions with blunting of the costophrenic angles and associated bibasilar atelectasis. No acute osseous abnormalities are seen.  IMPRESSION: Small pleural effusions and perivascular haziness suggestive of pulmonary edema. Recommend correlation for CHF.   Electronically Signed   By: Jeb Levering M.D.   On: 07/18/2015 02:08   Dg Chest 2 View  07/12/2015   CLINICAL DATA:  Status post pacemaker placement, low right shoulder pain  EXAM: CHEST - 2 VIEW  COMPARISON:  04/27/2015  FINDINGS: Cardiac shadow is stable. Lungs are well aerated bilaterally. No focal infiltrate or sizable effusion is noted. Mild central vascular congestion is noted without interstitial edema. A pacing device is now seen. No pneumothorax is noted. No bony  abnormality is seen.  IMPRESSION: Mild vascular congestion.  Status post pacemaker without pneumothorax.   Electronically Signed   By: Inez Catalina M.D.   On: 07/12/2015 07:41   Ct Angio Chest Aorta W/cm &/or Wo/cm  07/23/2015   CLINICAL DATA:  74 year old male with history of severe aortic stenosis. Preprocedural study prior to potential transcatheter aortic valve replacement (TAVR).  EXAM: CT ANGIOGRAPHY CHEST, ABDOMEN AND PELVIS  TECHNIQUE: Multidetector CT imaging through the chest, abdomen and pelvis was performed using the standard protocol during bolus administration of intravenous contrast. Multiplanar reconstructed images and MIPs were obtained and reviewed to evaluate the vascular anatomy.  CONTRAST:  1110mL OMNIPAQUE IOHEXOL 350 MG/ML SOLN  COMPARISON:  No priors.  FINDINGS: CTA CHEST FINDINGS  Mediastinum/Lymph Nodes: Heart size is enlarged. There is no significant pericardial fluid, thickening or pericardial calcification. There is atherosclerosis of the thoracic aorta, the great vessels of the mediastinum and the coronary arteries, including calcified atherosclerotic plaque in the left main, left anterior descending, left circumflex and right coronary arteries. Severe thickening and calcification of the aortic valve. Calcifications of the mitral valve and mitral annulus. 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 Dilatation of the pulmonic trunk (4.8 cm in diameter), suggestive of pulmonary arterial hypertension. Multiple borderline enlarged mediastinal lymph nodes measuring up to 9 mm in short axis. No definite pathologically enlarged mediastinal or hilar lymph nodes. Esophagus is unremarkable in appearance. No axillary lymphadenopathy.  Lungs/Pleura: Moderate bilateral pleural effusions layering dependently with extensive passive atelectasis in the dependent portions of the lower lobes of the lungs bilaterally. Mild  diffuse ground-glass attenuation and interlobular septal thickening in the lungs suggestive of a background of mild interstitial pulmonary edema. No acute consolidative airspace disease. No suspicious appearing pulmonary nodule or mass.  Musculoskeletal/Soft Tissues: There are no aggressive appearing lytic or blastic lesions noted in the visualized portions of the skeleton.  CTA ABDOMEN AND PELVIS FINDINGS  Hepatobiliary: No cystic or solid hepatic lesions. No intra or extrahepatic biliary ductal dilatation. Status post cholecystectomy.  Pancreas: No pancreatic mass. No pancreatic ductal dilatation. No pancreatic or peripancreatic fluid or inflammatory changes.  Spleen: Unremarkable.  Adrenals/Urinary Tract: 2.2 cm left adrenal nodule is indeterminate. Right adrenal gland is  normal in appearance. Exophytic 3.5 cm simple cyst in the posterior aspect of the interpolar region of the left kidney. In the lateral interpolar region of the right kidney there is a 1.7 cm high attenuation (74 HU) lesion which could represent an enhancing lesion or a proteinaceous/hemorrhagic cyst. No hydroureteronephrosis. Urinary bladder is remarkable for a 5 cm diverticulum in the posterior aspect of the urinary bladder on the left side.  Stomach/Bowel: The appearance of the stomach is normal. Several diverticulae of the duodenum are noted. No surrounding inflammatory changes to suggest associated duodenal diverticulitis. No pathologic dilatation of small bowel or colon. Numerous colonic diverticulae are noted, without surrounding inflammatory changes to suggest an acute diverticulitis at this time. Normal appendix.  Vascular/Lymphatic: Vascular findings and measurements pertinent to potential TAVR procedure, as detailed below. No aneurysm or dissection identified. No lymphadenopathy noted in the abdomen or pelvis.  Reproductive: Prostate gland and seminal vesicles are unremarkable in appearance.  Other: Small bilateral inguinal hernias  containing only fat incidentally noted. No significant volume of ascites. No pneumoperitoneum.  Musculoskeletal: Chronic appearing compression fracture of superior endplate of L1 with approximately 15% loss of anterior vertebral body height. There are no aggressive appearing lytic or blastic lesions noted in the visualized portions of the skeleton.  VASCULAR MEASUREMENTS PERTINENT TO TAVR:  AORTA:  Minimal Aortic Diameter -  16 x 16 mm  Severity of Aortic Calcification -  severe  RIGHT PELVIS:  Right Common Iliac Artery -  Minimal Diameter - 9.5 x 8.1 mm  Tortuosity - mild  Calcification - moderate  Right External Iliac Artery -  Minimal Diameter - 8.7 x 6.9 mm  Tortuosity - mild  Calcification - mild  Right Common Femoral Artery -  Minimal Diameter - Proximal 9.0 x 7.8 mm; Distal 8.6 x 3.6 mm  Tortuosity - mild  Calcification - moderate proximally, severe distally  LEFT PELVIS:  Left Common Iliac Artery -  Minimal Diameter - 11.6 x 10.0 mm  Tortuosity - mild  Calcification - moderate  Left External Iliac Artery -  Minimal Diameter - 8.8 x 7.8 mm  Tortuosity - mild  Calcification - mild  Left Common Femoral Artery -  Minimal Diameter - Proximal 7.0 x 6.1 mm; Distal 6.5 x 5.8 mm  Tortuosity - mild  Calcification - moderate proximally, severe distally  Review of the MIP images confirms the above findings.  IMPRESSION: 1. Vascular findings and measurements pertinent to potential TAVR procedure, as detailed above. This patient does appear to have suitable pelvic arterial access bilaterally, particularly if vascular access is achieved in the proximal common femoral arteries (distal common femoral arteries are not suitable for vascular access). 2. Severe thickening calcifications of the aortic valve, compatible with the reported clinical history of aortic stenosis. 3. Severe dilatation of the pulmonic trunk and main pulmonary arteries, suggestive of pulmonary arterial hypertension. 4. Cardiomegaly with evidence of mild  interstitial pulmonary edema and moderate bilateral pleural effusions, suggesting congestive heart failure. Numerous borderline enlarged mediastinal lymph nodes are likely secondary to underlying congestive heart failure. 5. 2.2 cm left adrenal nodule is indeterminate. Statistically, this is likely to represent a small adenoma, and could be further characterized with nonemergent noncontrast CT the abdomen. 6. 1.7 cm high attenuation lesion in the interpolar region of the right kidney may represent either a proteinaceous/hemorrhagic cyst or a solid enhancing lesion. Ideally, this would be characterized by MRI, however, given the patient's pacemaker, further evaluation with nonemergent renal ultrasound is strongly recommended at this  time. Alternatively, if this lesion is of similar high attenuation on the followup noncontrast CT the abdomen (recommended for number 6 above), no further characterization may be needed. 7. Additional incidental findings, as above.   Electronically Signed   By: Vinnie Langton M.D.   On: 07/23/2015 16:01   Ct Angio Abd/pel W/ And/or W/o  07/23/2015   CLINICAL DATA:  74 year old male with history of severe aortic stenosis. Preprocedural study prior to potential transcatheter aortic valve replacement (TAVR).  EXAM: CT ANGIOGRAPHY CHEST, ABDOMEN AND PELVIS  TECHNIQUE: Multidetector CT imaging through the chest, abdomen and pelvis was performed using the standard protocol during bolus administration of intravenous contrast. Multiplanar reconstructed images and MIPs were obtained and reviewed to evaluate the vascular anatomy.  CONTRAST:  118mL OMNIPAQUE IOHEXOL 350 MG/ML SOLN  COMPARISON:  No priors.  FINDINGS: CTA CHEST FINDINGS  Mediastinum/Lymph Nodes: Heart size is enlarged. There is no significant pericardial fluid, thickening or pericardial calcification. There is atherosclerosis of the thoracic aorta, the great vessels of the mediastinum and the coronary arteries, including  calcified atherosclerotic plaque in the left main, left anterior descending, left circumflex and right coronary arteries. Severe thickening and calcification of the aortic valve. Calcifications of the mitral valve and mitral annulus. 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 Dilatation of the pulmonic trunk (4.8 cm in diameter), suggestive of pulmonary arterial hypertension. Multiple borderline enlarged mediastinal lymph nodes measuring up to 9 mm in short axis. No definite pathologically enlarged mediastinal or hilar lymph nodes. Esophagus is unremarkable in appearance. No axillary lymphadenopathy.  Lungs/Pleura: Moderate bilateral pleural effusions layering dependently with extensive passive atelectasis in the dependent portions of the lower lobes of the lungs bilaterally. Mild diffuse ground-glass attenuation and interlobular septal thickening in the lungs suggestive of a background of mild interstitial pulmonary edema. No acute consolidative airspace disease. No suspicious appearing pulmonary nodule or mass.  Musculoskeletal/Soft Tissues: There are no aggressive appearing lytic or blastic lesions noted in the visualized portions of the skeleton.  CTA ABDOMEN AND PELVIS FINDINGS  Hepatobiliary: No cystic or solid hepatic lesions. No intra or extrahepatic biliary ductal dilatation. Status post cholecystectomy.  Pancreas: No pancreatic mass. No pancreatic ductal dilatation. No pancreatic or peripancreatic fluid or inflammatory changes.  Spleen: Unremarkable.  Adrenals/Urinary Tract: 2.2 cm left adrenal nodule is indeterminate. Right adrenal gland is normal in appearance. Exophytic 3.5 cm simple cyst in the posterior aspect of the interpolar region of the left kidney. In the lateral interpolar region of the right kidney there is a 1.7 cm high attenuation (74 HU) lesion which could represent an enhancing lesion or a  proteinaceous/hemorrhagic cyst. No hydroureteronephrosis. Urinary bladder is remarkable for a 5 cm diverticulum in the posterior aspect of the urinary bladder on the left side.  Stomach/Bowel: The appearance of the stomach is normal. Several diverticulae of the duodenum are noted. No surrounding inflammatory changes to suggest associated duodenal diverticulitis. No pathologic dilatation of small bowel or colon. Numerous colonic diverticulae are noted, without surrounding inflammatory changes to suggest an acute diverticulitis at this time. Normal appendix.  Vascular/Lymphatic: Vascular findings and measurements pertinent to potential TAVR procedure, as detailed below. No aneurysm or dissection identified. No lymphadenopathy noted in the abdomen or pelvis.  Reproductive: Prostate gland and seminal vesicles are unremarkable in appearance.  Other: Small bilateral inguinal hernias containing only fat incidentally noted. No significant volume of ascites. No pneumoperitoneum.  Musculoskeletal: Chronic appearing compression fracture of  superior endplate of L1 with approximately 15% loss of anterior vertebral body height. There are no aggressive appearing lytic or blastic lesions noted in the visualized portions of the skeleton.  VASCULAR MEASUREMENTS PERTINENT TO TAVR:  AORTA:  Minimal Aortic Diameter -  16 x 16 mm  Severity of Aortic Calcification -  severe  RIGHT PELVIS:  Right Common Iliac Artery -  Minimal Diameter - 9.5 x 8.1 mm  Tortuosity - mild  Calcification - moderate  Right External Iliac Artery -  Minimal Diameter - 8.7 x 6.9 mm  Tortuosity - mild  Calcification - mild  Right Common Femoral Artery -  Minimal Diameter - Proximal 9.0 x 7.8 mm; Distal 8.6 x 3.6 mm  Tortuosity - mild  Calcification - moderate proximally, severe distally  LEFT PELVIS:  Left Common Iliac Artery -  Minimal Diameter - 11.6 x 10.0 mm  Tortuosity - mild  Calcification - moderate  Left External Iliac Artery -  Minimal Diameter - 8.8 x 7.8  mm  Tortuosity - mild  Calcification - mild  Left Common Femoral Artery -  Minimal Diameter - Proximal 7.0 x 6.1 mm; Distal 6.5 x 5.8 mm  Tortuosity - mild  Calcification - moderate proximally, severe distally  Review of the MIP images confirms the above findings.  IMPRESSION: 1. Vascular findings and measurements pertinent to potential TAVR procedure, as detailed above. This patient does appear to have suitable pelvic arterial access bilaterally, particularly if vascular access is achieved in the proximal common femoral arteries (distal common femoral arteries are not suitable for vascular access). 2. Severe thickening calcifications of the aortic valve, compatible with the reported clinical history of aortic stenosis. 3. Severe dilatation of the pulmonic trunk and main pulmonary arteries, suggestive of pulmonary arterial hypertension. 4. Cardiomegaly with evidence of mild interstitial pulmonary edema and moderate bilateral pleural effusions, suggesting congestive heart failure. Numerous borderline enlarged mediastinal lymph nodes are likely secondary to underlying congestive heart failure. 5. 2.2 cm left adrenal nodule is indeterminate. Statistically, this is likely to represent a small adenoma, and could be further characterized with nonemergent noncontrast CT the abdomen. 6. 1.7 cm high attenuation lesion in the interpolar region of the right kidney may represent either a proteinaceous/hemorrhagic cyst or a solid enhancing lesion. Ideally, this would be characterized by MRI, however, given the patient's pacemaker, further evaluation with nonemergent renal ultrasound is strongly recommended at this time. Alternatively, if this lesion is of similar high attenuation on the followup noncontrast CT the abdomen (recommended for number 6 above), no further characterization may be needed. 7. Additional incidental findings, as above.   Electronically Signed   By: Vinnie Langton M.D.   On: 07/23/2015 16:01      CBC  Recent Labs Lab 07/19/15 0241 07/20/15 0310 07/22/15 0820 07/23/15 0321  WBC 10.0 10.9* 10.6* 11.1*  HGB 8.1* 9.0* 8.4* 8.5*  HCT 24.9* 27.6* 25.3* 26.0*  PLT 293 332 345 357  MCV 92.9 92.6 93.7 94.2  MCH 30.2 30.2 31.1 30.8  MCHC 32.5 32.6 33.2 32.7  RDW 14.6 14.8 15.1 15.4    Chemistries   Recent Labs Lab 07/19/15 0241 07/19/15 2025 07/20/15 0310 07/22/15 0820 07/23/15 0321  NA 134* 135 133* 134* 134*  K 4.3 4.5 4.5 4.0 4.1  CL 95* 96* 92* 95* 95*  CO2 28 27 27 28 28   GLUCOSE 159* 176* 167* 154* 149*  BUN 34* 44* 52* 33* 25*  CREATININE 4.32* 5.16* 5.72* 4.23* 3.96*  CALCIUM 8.5* 9.3  9.3 8.6* 8.5*   ------------------------------------------------------------------------------------------------------------------ estimated creatinine clearance is 18.7 mL/min (by C-G formula based on Cr of 3.96). ------------------------------------------------------------------------------------------------------------------ No results for input(s): HGBA1C in the last 72 hours. ------------------------------------------------------------------------------------------------------------------ No results for input(s): CHOL, HDL, LDLCALC, TRIG, CHOLHDL, LDLDIRECT in the last 72 hours. ------------------------------------------------------------------------------------------------------------------ No results for input(s): TSH, T4TOTAL, T3FREE, THYROIDAB in the last 72 hours.  Invalid input(s): FREET3 ------------------------------------------------------------------------------------------------------------------ No results for input(s): VITAMINB12, FOLATE, FERRITIN, TIBC, IRON, RETICCTPCT in the last 72 hours.  Coagulation profile  Recent Labs Lab 07/22/15 0720  INR 1.28    No results for input(s): DDIMER in the last 72 hours.  Cardiac Enzymes  Recent Labs Lab 07/18/15 1039 07/18/15 1603  TROPONINI 0.82* 0.64*    ------------------------------------------------------------------------------------------------------------------ Invalid input(s): POCBNP   Time Spent in minutes  35   SINGH,PRASHANT K M.D on 07/25/2015 at 10:16 AM  Between 7am to 7pm - Pager - [phone]  After 7pm go to [email or web address] - password [facility]  [facility] -  Office  [phone]

## 2015-07-25 NOTE — Progress Notes (Signed)
Pt. And family request that they would like to NOT have the bed alarm on. Pt. And family made aware of safety importance and fall precautions. Charge RN Nellie aware.

## 2015-07-26 ENCOUNTER — Inpatient Hospital Stay (HOSPITAL_COMMUNITY): Payer: Medicare Other

## 2015-07-26 DIAGNOSIS — I350 Nonrheumatic aortic (valve) stenosis: Secondary | ICD-10-CM

## 2015-07-26 LAB — GLUCOSE, CAPILLARY
GLUCOSE-CAPILLARY: 198 mg/dL — AB (ref 65–99)
GLUCOSE-CAPILLARY: 173 mg/dL — AB (ref 65–99)
GLUCOSE-CAPILLARY: 173 mg/dL — AB (ref 65–99)
Glucose-Capillary: 148 mg/dL — ABNORMAL HIGH (ref 65–99)
Glucose-Capillary: 164 mg/dL — ABNORMAL HIGH (ref 65–99)

## 2015-07-26 MED ORDER — PROMETHAZINE HCL 25 MG/ML IJ SOLN
12.5000 mg | Freq: Four times a day (QID) | INTRAMUSCULAR | Status: DC | PRN
  Administered 2015-07-26: 12.5 mg via INTRAVENOUS
  Filled 2015-07-26 (×2): qty 1

## 2015-07-26 MED ORDER — DIPHENHYDRAMINE HCL 50 MG/ML IJ SOLN
12.5000 mg | Freq: Once | INTRAMUSCULAR | Status: AC
  Administered 2015-07-26: 12.5 mg via INTRAVENOUS
  Filled 2015-07-26: qty 1

## 2015-07-26 MED ORDER — DARBEPOETIN ALFA 100 MCG/0.5ML IJ SOSY
100.0000 ug | PREFILLED_SYRINGE | Freq: Once | INTRAMUSCULAR | Status: AC
  Administered 2015-07-27: 100 ug via INTRAVENOUS
  Filled 2015-07-26: qty 0.5

## 2015-07-26 NOTE — Progress Notes (Signed)
Pt c/o nausea after receiving zofran this AM.  Pt given phenergan, shortly after pt c/o of being diaphoretic and feeling awful.  Pt wanting to sit on the side of the bed, but swaying back and forth.  Assisted pt back to lying position.  VS wnl.  MD notified and orders given for 12.5 benadryl, DC phenergan, 6 L o2, and have pt sitting up in bed at 45 degree angle.  Orders carried out, cardiology notified as well.  Will continue to monitor.

## 2015-07-26 NOTE — Progress Notes (Signed)
[address]       [place] [address]             [phone]     CARDIOTHORACIC SURGERY PROGRESS NOTE  4 Days Post-Op  S/P Procedure(s) (LRB): Right/Left Heart Cath and Coronary Angiography (N/A)  Subjective: Mr Jeffrey [name] states that he has been having abdominal pain and nausea all day, as well as emesis early this morning.  He did have a "good" bowel movement last night.  He denies cough or chest pain.  Still dyspneic with activity.  Dialysis not done yesterday or today.  Objective: Vital signs in last 24 hours: Temp:  [97.5 F (36.4 C)-98.2 F (36.8 C)] 97.5 F (36.4 C) (08/30 1506) Pulse Rate:  [72-85] 83 (08/30 1552) Cardiac Rhythm:  [-] Ventricular paced (08/30 0835) Resp:  [20] 20 (08/30 1423) BP: (75-116)/(42-60) 92/46 mmHg (08/30 1552) SpO2:  [91 %-96 %] 96 % (08/30 1423) Weight:  [94.666 kg (208 lb 11.2 oz)] 94.666 kg (208 lb 11.2 oz) (08/30 [id number])  Physical Exam:  Rhythm:   paced  Breath sounds: Few crackles  Heart sounds:  RRR w/ systolic murmur  Incisions:  n/a  Abdomen:  Non-distended.  Mild tenderness diffusely w/out rebound or guarding  Extremities:  Warm, adequately perfused   Intake/Output from previous day: 08/29 0701 - 08/30 0700 In: 580 [P.O.:580] Out: 200 [Urine:200] Intake/Output this shift: Total I/O In: 340 [P.O.:340] Out: -   Lab Results: No results for input(s): WBC, HGB, HCT, PLT in the last 72 hours. BMET:  Recent Labs  07/25/15 1948  NA 132*  K 5.4*  CL 92*  CO2 23  GLUCOSE 176*  BUN 64*  CREATININE 9.52*  CALCIUM 9.3    CBG (last 3)   Recent Labs  07/26/15 1128 07/26/15 1453 07/26/15 1639  GLUCAP 173* 173* 148*   PT/INR:  No results for input(s): LABPROT, INR in the last 72 hours.  CXR:  N/A  Cardiac TAVR CT  TECHNIQUE: The patient was scanned on a Philips 256 scanner. A 120 kV retrospective scan was triggered in the descending thoracic aorta at 111 HU's. Gantry rotation speed was 270  msecs and collimation was .9 mm. 5 mg of iv Metoprolol or nitro were given. The 3D data set was reconstructed in 5% intervals of the R-R cycle. Systolic and diastolic phases were analyzed on a dedicated work station using MPR, MIP and VRT modes. The patient received 80 cc of contrast.  FINDINGS: Aortic Valve: Trileaflet. Severely thickened and calcified with severely restricted leaflet opening.  Aorta: Minimal diffuse calcifications. Normal caliber. No dissection.  Sinotubular Junction: 36 x 33 mm  Ascending Thoracic Aorta: 38 x 36 mm  Aortic Arch: Not visualized  Descending Thoracic Aorta: 29 x 28 mm  Sinus of Valsalva Measurements:  Non-coronary: 37 mm  Right -coronary: 38 mm  Left -coronary: 40 mm  Coronary Artery Height above Annulus:  Left Main: 12 mm  Right Coronary: 16 mm  Virtual Basal Annulus Measurements:  Maximum/Minimum Diameter: 28 x 23 mm  Perimeter: 97 mm  Area: 521 mm2  Optimum Fluoroscopic Angle for Delivery: RAO 2 CRA 2  IMPRESSION: 1. Severely thickened and calcified aortic valve with severely restricted leaflet opening and annular measurements suitable for deployment of 26 mm Edward-SAPIEN 3 valve.  2. Sufficient annulus to coronary distance.  3. Optimum Fluoroscopic Angle for Delivery: RAO 2 CRA 2.  Ena Dawley   Electronically Signed  By: Ena Dawley  On:  07/25/2015 19:05      Study Result     EXAM: OVER-READ INTERPRETATION CT CHEST  The following report is an over-read performed by radiologist Dr. Fonnie [facility] on 07/25/2015. This over-read does not include interpretation of cardiac or coronary anatomy or pathology. The coronary calcium score/coronary CTA interpretation by the cardiologist is attached.  COMPARISON: 07/21/2015.  FINDINGS: Pulmonary arteries are rather enlarged. Low right paratracheal lymph nodes measure up to 11 mm,  likely reactive. Visualized portions of the lungs show septal thickening with a focal area of new consolidation in the subpleural lingula. Bilateral effusions and compressive atelectasis in both lower lobes, incompletely imaged.  IMPRESSION: 1. Congestive heart failure. 2. New infectious or inflammatory consolidation in the lingula. 3. Pulmonary arterial hypertension.  Electronically Signed: By: Lorin Picket M.D. On: 07/25/2015 15:19     PORTABLE ABDOMEN - 1 VIEW  COMPARISON: Abdominal CT from 5 days ago  FINDINGS: Small bilateral kidneys with high-density appearance from IV contrast yesterday. Contrast is seen in the urinary bladder, including large diverticulum. Nonobstructive bowel gas pattern. No concerning intra-abdominal mass effect. Cholecystectomy.  IMPRESSION: 1. Nonobstructive bowel gas pattern. 2. Large bladder diverticulum.   Electronically Signed  By: Monte Fantasia M.D.  On: 07/26/2015 08:51   Assessment/Plan:  Source of abdominal pain, nausea and emesis unclear, although no findings to suggest an acute abdominal process has developed.  Cardiac-gated CTA notable for new opacity in left lung c/w possible pneumonia.  Dialysis hasn't been done since Friday.  I am not sure that Mr Kaczmarczyk will be ready for TAVR next week.  Although he has severe AS and CHF, his CHF improved dramatically following dialysis last week.  He hasn't been dialyzed since despite remaining in the hospital, and now his biggest complaint is abdominal pain and nausea.  CT suggests possible developing inflammatory process in the left lung.  Will continue to follow and consider possible TAVR next week if there are no other ongoing medical problems.  I spent in excess of 15 minutes during the conduct of this hospital encounter and >50% of this time involved direct face-to-face encounter with the patient for counseling and/or coordination of their care.    Rexene Alberts 07/26/2015 6:34 PM

## 2015-07-26 NOTE — Progress Notes (Signed)
Pt took evening pills then vomited. Pt c/o abdominal pain and nausea. Pt given zofran, pt states he cannot take tylenol right now and cannot have other IV pain medicine as it will lower his blood pressure. Will continue to monitor. Ronnette Hila, RN

## 2015-07-26 NOTE — Progress Notes (Signed)
Pt refused wearing CPAP. Maintained on 02 at 2L  [place] . Sat on the low 90's.pt also refused to have 3 SR up. Stated "i won't fall". Continued to observe closely. No SOB noted

## 2015-07-26 NOTE — Progress Notes (Signed)
PT Cancellation Note  Patient Details Name: Jeffrey [name] MRN: [id number] DOB: [birth year]   Cancelled Treatment:    Reason Eval/Treat Not Completed: Other (comment) Sleeping soundly upon arrival;  Pt's wife states he has been quite nauseated today, and politely requests that we hold PT for today;   Roney Marion, [place] Pager [phone] Office [phone]   Roney Marion [facility] 07/26/2015, 9:41 AM

## 2015-07-26 NOTE — Consult Note (Signed)
HEART AND [place] SURGERY CONSULTATION REPORT  Referring Provider is [name], MD PCP is Thurman Coyer, MD  Chief Complaint  Patient presents with  . Chest Pain  . 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 An echo shows severe AS with mean gradient of 45 mm Hg, EF of 55-60%. His wife and daughter say that he was doing well until a few weeks ago when he had rapid progression of shortness of breath, lower extremity edema, decreased appetite and generalized weakness. Cardiac cath on 07/22/2015 shows chronic occlusion of the mid RCA with left to right collaterals to the distal vessel, and moderate 50% mid LAD disease. He has undergone TAVR workup while in the hospital due to his symptoms with generalized weakness and ESRD on HD.   Past Medical History  Diagnosis Date  . Hyperlipidemia   . Hypertension   . Aortic stenosis     a. severe by echo 09/2014  . Atrial fibrillation     a. not well documented, not on anticoagulation  . Coronary artery disease     a. chronically occluded RCA per cath 09/2014 with collaterals  . Peripheral vascular disease   . Claustrophobia   . Complete heart block   . Presence of permanent cardiac pacemaker   . Anginal pain   . Myocardial infarction 10/2014  . Pneumonia   . Type  II diabetes mellitus   . Iron deficiency anemia   . History of blood transfusion     "related to gallbladder OR"  . History of stomach ulcers   . CVA (cerebral vascular accident) 10/2014    denies residual on 07/11/2015  . ESRD (end stage renal disease) on dialysis     a. on dialysis; [facility]; MWF, LUE fistula (07/11/2015)  . CHF (congestive heart failure)   . Renal insufficiency     Past Surgical History  Procedure Laterality Date  . Esophagogastroduodenoscopy  08/01/2012    Procedure: ESOPHAGOGASTRODUODENOSCOPY (EGD);  Surgeon: [name], MD;  Location: [facility];  Service: Endoscopy;  Laterality: N/A;  . Tonsillectomy    . Colonoscopy w/ biopsies and polypectomy    . Av fistula placement Left 10/19/2014    Procedure: BRACHIOCEPHALIC ARTERIOVENOUS (AV) FISTULA CREATION ;  Surgeon: Conrad Tensed, MD;  Location: [facility];  Service: Vascular;  Laterality: Left;  . Left and right heart catheterization with coronary angiogram N/A 09/30/2014    Procedure: LEFT AND RIGHT HEART CATHETERIZATION WITH CORONARY ANGIOGRAM;  Surgeon: [name], MD;  Location: [facility] CATH LAB;  Service: Cardiovascular;  Laterality: N/A;  . Insertion of dialysis catheter Right 02/02/2015    Procedure: INSERTION OF DIALYSIS CATHETER  RIGHT INTERNAL JUGULAR;  Surgeon: Mal [name], MD;  Location: [facility];  Service: Vascular;  Laterality: Right;  . Cardiac catheterization    . Insert / replace / remove pacemaker  07/11/2015  . Cholecystectomy open  1980's  .  Cataract extraction w/ intraocular lens  implant, bilateral Bilateral 1990's  . Coronary angioplasty    . Ep implantable device N/A 07/11/2015    Procedure: Pacemaker Implant;  Surgeon: [name], MD;  Location: [facility] CV LAB;  Service: Cardiovascular;  Laterality: N/A;  . Cardiac catheterization N/A 07/22/2015    Procedure: Right/Left Heart Cath and Coronary Angiography;  Surgeon: [name], MD;  Location: [facility];   Service: Cardiovascular;  Laterality: N/A;    Family History  Problem Relation Age of Onset  . Diabetes Father   . Heart disease Father   . Diabetes Sister     Social History   Social History  . Marital Status: Married    Spouse Name: N/A  . Number of Children: N/A  . Years of Education: N/A   Occupational History  . Not on file.   Social History Main Topics  . Smoking status: Former Smoker -- 2.00 packs/day for 32 years    Quit date: 11/27/1983  . Smokeless tobacco: Never Used  . Alcohol Use: Yes     Comment: 07/11/2015 "stopped drinking in 09/2014"  . Drug Use: No  . Sexual Activity: Not on file   Other Topics Concern  . Not on file   Social History Narrative    Current Facility-Administered Medications  Medication Dose Route Frequency Provider Last Rate Last Dose  . 0.9 %  sodium chloride infusion  100 mL Intravenous PRN Roney Jaffe, MD      . acetaminophen (TYLENOL) tablet 650 mg  650 mg Oral Q6H PRN Theressa Millard, MD      . albuterol (PROVENTIL) (2.5 MG/3ML) 0.083% nebulizer solution 2.5 mg  2.5 mg Nebulization Q4H PRN Kinnie Feil, MD   2.5 mg at 07/25/15 1620  . alteplase (CATHFLO ACTIVASE) injection 2 mg  2 mg Intracatheter Once PRN Roney Jaffe, MD      . antiseptic oral rinse (CPC / CETYLPYRIDINIUM CHLORIDE 0.05%) solution 7 mL  7 mL Mouth Rinse BID Theressa Millard, MD   7 mL at 07/26/15 1000  . aspirin tablet 325 mg  325 mg Oral Daily Theressa Millard, MD   325 mg at 07/26/15 1102  . calcium carbonate (TUMS - dosed in mg elemental calcium) chewable tablet 600 mg of elemental calcium  3 tablet Oral TID WC Alric Seton, PA-C   600 mg of elemental calcium at 07/25/15 1143  . clopidogrel (PLAVIX) tablet 75 mg  75 mg Oral Daily Theressa Millard, MD   75 mg at 07/26/15 1103  . Darbepoetin Alfa (ARANESP) injection 100 mcg  100 mcg Intravenous Q Mon-HD Fleet Contras, MD   100 mcg at 07/18/15 1051  . Darbepoetin Alfa (ARANESP) injection 100  mcg  100 mcg Intravenous Once Thurnell Lose, MD      . feeding supplement (NEPRO CARB STEADY) liquid 237 mL  237 mL Oral PRN Roney Jaffe, MD      . ferric gluconate (NULECIT) 62.5 mg in sodium chloride 0.9 % 100 mL IVPB  62.5 mg Intravenous Q Fri-HD Alric Seton, PA-C   62.5 mg at 07/22/15 1020  . heparin injection 1,000 Units  1,000 Units Dialysis PRN Roney Jaffe, MD      . heparin injection 5,000 Units  5,000 Units Subcutaneous 3 times per day Theressa Millard, MD   5,000 Units at 07/26/15 [phone]  . HYDROcodone-homatropine (HYCODAN) 5-1.5 MG/5ML syrup 5 mL  5 mL Oral Q4H PRN Sanda Klein, MD      .  insulin aspart (novoLOG) injection 0-9 Units  0-9 Units Subcutaneous TID WC Kinnie Feil, MD   2 Units at 07/26/15 1309  . lidocaine (PF) (XYLOCAINE) 1 % injection 5 mL  5 mL Intradermal PRN Roney Jaffe, MD      . lidocaine-prilocaine (EMLA) cream 1 application  1 application Topical PRN Roney Jaffe, MD      . metoprolol (LOPRESSOR) injection 5 mg  5 mg Intravenous Q5 min PRN Dorothy Spark, MD   5 mg at 07/25/15 1453  . metoprolol tartrate (LOPRESSOR) tablet 12.5 mg  12.5 mg Oral BID Theressa Millard, MD   12.5 mg at 07/25/15 2209  . midodrine (PROAMATINE) tablet 10 mg  10 mg Oral TID WC Thurnell Lose, MD   10 mg at 07/26/15 1103  . multivitamin (RENA-VIT) tablet 1 tablet  1 tablet Oral QHS Fleet Contras, MD   1 tablet at 07/25/15 2209  . nitroGLYCERIN (NITROSTAT) SL tablet 0.4 mg  0.4 mg Sublingual Q5 min PRN Everlene Balls, MD      . ondansetron (ZOFRAN) injection 4 mg  4 mg Intravenous Q6H PRN Theressa Millard, MD   4 mg at 07/26/15 0740  . pentafluoroprop-tetrafluoroeth (GEBAUERS) aerosol 1 application  1 application Topical PRN Roney Jaffe, MD      . promethazine (PHENERGAN) injection 12.5 mg  12.5 mg Intravenous Q6H PRN Thurnell Lose, MD   12.5 mg at 07/26/15 1347    Allergies  Allergen Reactions  . Byetta 10 Mcg Pen [Exenatide] Diarrhea and Nausea And  Vomiting      Review of Systems:   General:normal appetite, decreased energy, no weight gain, + significant weight loss, no fever Cardiac:+ chest pain with exertion, + chest pain at rest, + SOB with exertion, + resting SOB, + PND, no orthopnea, no palpitations, + arrhythmia, no atrial fibrillation, mild LE edema, + dizzy spells, no syncope Respiratory:+ shortness of breath, no home oxygen, + productive cough, + dry cough, no bronchitis, no wheezing, no hemoptysis, no asthma, no pain with inspiration or cough, no sleep apnea, no CPAP at night GI:no difficulty swallowing, no reflux, no frequent heartburn, no hiatal hernia, no abdominal pain, no constipation, no diarrhea, no hematochezia, no hematemesis, no melena GU:+ makes a small amount of urine daily, no dysuria, no frequency, no urinary tract infection, no hematuria, no enlarged prostate, no kidney stones, + kidney disease, on dialysis MWF at [facility] via left upper arm AV fistula Vascular:no pain suggestive of claudication, no pain in feet, no leg cramps, no varicose veins, no DVT, no non-healing foot ulcer Neuro:+ stroke, no TIA's, no seizures, no headaches, no temporary blindness one eye, no slurred speech, no peripheral neuropathy, + chronic pain, mild instability of gait, no memory/cognitive dysfunction Musculoskeletal:+ arthritis - primarily involving the left knee, + joint swelling, no myalgias, mild difficulty walking, diminished mobility  Skin:no rash, no itching, no skin infections, no pressure sores or ulcerations Psych:no anxiety, no depression, no nervousness, no unusual recent  stress Eyes:+ blurry vision, no floaters, no recent vision changes, does not wear glasses or contacts ENT:no hearing loss, edentulous, no dentures, last saw dentist many years ago Hematologic:no easy bruising, no abnormal bleeding, no clotting disorder, no frequent epistaxis Endocrine:+ diabetes, checks CBG's at home       Physical Exam:   BP 93/52 mmHg  Pulse 79  Temp(Src) 97.8 F (36.6 C) (Oral)  Resp 20  Ht 5\' 11"  (1.803 m)  Wt 94.666 kg (  208 lb 11.2 oz)  BMI 29.12 kg/m2  SpO2 91%  General:  Chronically ill-appearing but in no distress  HEENT:  Unremarkable , NCAT, PERLA, EOMI, edentulous  Neck:   no JVD, no bruits, no adenopathy or thyromegaly  Chest:   clear to auscultation, symmetrical breath sounds, no wheezes, no rhonchi   CV:   RRR, grade III/VI crescendo/decrescendo murmur heard best at RSB,  no diastolic murmur  Abdomen:  soft, non-tender, no masses or organomegaly  Extremities:  warm, well-perfused, pulses diminished in feet, mild LE edema, left upper arm AV fistula  Rectal/GU  Deferred  Neuro:   Grossly non-focal and symmetrical throughout  Skin:   Clean and dry, no rashes, no breakdown   Diagnostic Tests:             *[facility]*         *[facility]*            [address], [place] [address]              [phone]  ------------------------------------------------------------------- Transthoracic Echocardiography  Patient:  Sheridan, Ritchison MR #:    [id number] Study Date: 07/18/2015 Gender:   M Age:    26 Height:   180.3 cm Weight:   97.1 kg BSA:    2.23 m^2 Pt. Status: Room:    2H16C  ADMITTING  Arnoldo Morale, Harvette C ATTENDING  Arnoldo Morale, Harvette C SONOGRAPHER Marygrace Drought,  RCS ORDERING   [place], Ulugbek N REFERRING  [place], Ulugbek N PERFORMING  [facility], Outpatient  cc:  ------------------------------------------------------------------- LV EF: 55% -  60%  ------------------------------------------------------------------- Indications:   424.1 Aortic valve disorders.  ------------------------------------------------------------------- History:  PMH:  Chest pain. Coronary artery disease. Risk factors: Hypertension. Diabetes mellitus.  ------------------------------------------------------------------- Study Conclusions  - Left ventricle: LVEF is approximately 50% with distal inferior and apical hypokinesis. The cavity size was normal. Wall thickness was normal. Systolic function was normal. The estimated ejection fraction was in the range of 55% to 60%. - Aortic valve: AV is thickened, calcified with restricted motion Peak and mean gradiehts through the valve are 75 and 45 mm Hg respectively conssitent with severe AS. - Mitral valve: There was mild regurgitation. - Left atrium: The atrium was severely dilated. - Right ventricle: The cavity size was mildly dilated. Wall thickness was normal. - Right atrium: The atrium was moderately dilated. - Pulmonary arteries: PA peak pressure: 32 mm Hg (S). - Pericardium, extracardiac: A trivial pericardial effusion was identified.  Transthoracic echocardiography. M-mode, complete 2D, spectral Doppler, and color Doppler. Birthdate: Patient birthdate: 15-Dec-1940. Age: Patient is 74 yr old. Sex: Gender: male. BMI: 29.8 kg/m^2. Blood pressure:   123/63 Patient status: Inpatient. Study date: Study date: 07/18/2015. Study time: 02:46 PM. Location: Echo laboratory.  -------------------------------------------------------------------  ------------------------------------------------------------------- Left ventricle: LVEF is approximately 50% with distal inferior  and apical hypokinesis. The cavity size was normal. Wall thickness was normal. Systolic function was normal. The estimated ejection fraction was in the range of 55% to 60%.  ------------------------------------------------------------------- Aortic valve: AV is thickened, calcified with restricted motion Peak and mean gradiehts through the valve are 75 and 45 mm Hg respectively conssitent with severe AS. Doppler:   VTI ratio of LVOT to aortic valve: 0.23. Valve area (VTI): 0.74 cm^2. Indexed valve area (VTI): 0.33 cm^2/m^2. Peak velocity ratio of LVOT to aortic valve: 0.21. Valve area (Vmax): 0.66 cm^2. Indexed valve area (Vmax): 0.3 cm^2/m^2. Mean  velocity ratio of LVOT to aortic valve: 0.24. Valve area (Vmean): 0.74 cm^2. Indexed valve area (Vmean): 0.33 cm^2/m^2.  Mean gradient (S): 43 mm Hg. Peak gradient (S): 84 mm Hg.  ------------------------------------------------------------------- Mitral valve:  Calcified annulus. Mildly thickened leaflets . Doppler: There was mild regurgitation.  Peak gradient (D): 8 mm Hg.  ------------------------------------------------------------------- Left atrium: The atrium was severely dilated.  ------------------------------------------------------------------- Right ventricle: The cavity size was mildly dilated. Wall thickness was normal. Pacer wire or catheter noted in right ventricle. Systolic function was normal.  ------------------------------------------------------------------- Pulmonic valve:  Structurally normal valve.  Cusp separation was normal. Doppler: Transvalvular velocity was within the normal range. There was mild regurgitation.  ------------------------------------------------------------------- Tricuspid valve:  Structurally normal valve.  Leaflet separation was normal. Doppler: Transvalvular velocity was within the normal range. There was mild  regurgitation.  ------------------------------------------------------------------- Right atrium: The atrium was moderately dilated. Pacer wire or catheter noted in right atrium.  ------------------------------------------------------------------- Pericardium: A trivial pericardial effusion was identified.  ------------------------------------------------------------------- Systemic veins: Inferior vena cava: The vessel was dilated. The respirophasic diameter changes were blunted (< 50%), consistent with elevated central venous pressure. Diameter: 21 mm.  ------------------------------------------------------------------- Measurements  IVC                    Value     Reference ID                    21  mm    ---------  Left ventricle              Value     Reference LV ID, ED, PLAX chordal      (H)   55.3 mm    43 - 52 LV ID, ES, PLAX chordal          37.3 mm    23 - 38 LV fx shortening, PLAX chordal      33  %    >=29 LV PW thickness, ED            11.7 mm    --------- IVS/LV PW ratio, ED            1.05      <=1.3 Stroke volume, 2D             77  ml    --------- Stroke volume/bsa, 2D           35  ml/m^2  --------- LV ejection fraction, 1-p A4C       64  %    --------- LV e&', lateral              12.4 cm/s   --------- LV E/e&', lateral             11.13     --------- LV e&', medial               7.44 cm/s   --------- LV E/e&', medial              18.55     --------- LV e&', average              9.92 cm/s   --------- LV E/e&', average             13.91     ---------  Ventricular septum            Value     Reference IVS thickness, ED              12.3  mm    ---------  LVOT                   Value     Reference LVOT ID, S                20  mm    --------- LVOT area                 3.14 cm^2   --------- LVOT peak velocity, S           95.9 cm/s   --------- LVOT mean velocity, S           71.5 cm/s   --------- LVOT VTI, S                24.6 cm    --------- LVOT peak gradient, S           4   mm Hg  ---------  Aortic valve               Value     Reference Aortic valve peak velocity, S       458  cm/s   --------- Aortic valve mean velocity, S       303  cm/s   --------- Aortic valve VTI, S            105  cm    --------- Aortic mean gradient, S          43  mm Hg  --------- Aortic peak gradient, S          84  mm Hg  --------- VTI ratio, LVOT/AV            0.23      --------- Aortic valve area, VTI          0.74 cm^2   --------- Aortic valve area/bsa, VTI        0.33 cm^2/m^2 --------- Velocity ratio, peak, LVOT/AV       0.21      --------- Aortic valve area, peak velocity     0.66 cm^2   --------- Aortic valve area/bsa, peak        0.3  cm^2/m^2 --------- velocity Velocity ratio, mean, LVOT/AV       0.24      --------- Aortic valve area, mean velocity     0.74 cm^2   --------- Aortic valve area/bsa, mean        0.33 cm^2/m^2 --------- velocity  Aorta                   Value     Reference Aortic root ID, ED            31  mm    ---------  Left atrium                Value     Reference LA ID, A-P, ES              55  mm    --------- LA ID/bsa, A-P          (H)   2.47 cm/m^2  <=2.2 LA volume, S                115  ml    --------- LA volume/bsa, S             51.6 ml/m^2  --------- LA volume, ES, 1-p A4C          116  ml    --------- LA volume/bsa, ES, 1-p A4C        52.1 ml/m^2  --------- LA volume, ES, 1-p A2C          108  ml    --------- LA volume/bsa, ES, 1-p A2C        48.5 ml/m^2  ---------  Mitral valve               Value     Reference Mitral E-wave peak velocity        138  cm/s   --------- Mitral deceleration time         162  ms    150 - 230 Mitral peak gradient, D          8   mm Hg  ---------  Pulmonary arteries            Value     Reference PA pressure, S, DP        (H)   32  mm Hg  <=30  Tricuspid valve              Value     Reference Tricuspid maximal inflow         254  cm/s   --------- velocity, PISA Tricuspid regurg peak velocity      254  cm/s   --------- Tricuspid peak RV-RA gradient       26  mm Hg  --------- Tricuspid maximal regurg         26  cm/s   --------- velocity, PISA  Systemic veins              Value     Reference Estimated CVP               3   mm Hg  ---------  Right ventricle              Value     Reference RV pressure, S, DP            29  mm Hg  <=30 RV s&', lateral, S             15.6 cm/s   ---------  Legend: (L) and (H) mark values outside specified reference range.  ------------------------------------------------------------------- Prepared and Electronically Authenticated by  Dorris Carnes, M.D. 2016-08-22T20:31:52   Cardiac Cath:  Conclusion     Mid RCA lesion, 100% stenosed with distal vessel filling from left to right collaterals.  Prox Cx lesion, 20% stenosed.  Mid Cx lesion, 30%  stenosed.  3rd Mrg lesion, 20% stenosed.  Mid LAD lesion, 50% stenosed.  Ost 2nd Diag lesion, 40% stenosed.  1. 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 This does not appear to be flow limiting. Chronic occlusion mid RCA with filling of the distal vessel from left to right collaterals.  2. Severe aortic stenosis by echo and cardiac cath in November. I did not cross the aortic valve today.   Recommendations: Continue planning for AVR vs TAVR. He will not need coronary intervention before the aortic valve procedure. He may be able to be discharged this weekend and have follow up in the valve clinic to finalize planning for the aortic valve procedure.      Coronary Findings    Dominance: Right   Left Anterior Descending   . Prox LAD lesion, 50% stenosed. calcified .   [name] First Diagonal Branch   The vessel is moderate in  size.   . First Septal Branch   The vessel is moderate in size.   [name] Second Diagonal Branch   The vessel is small in size.   Colon Flattery 2nd Diag lesion, 40% stenosed. discrete .   [name] Second Septal Branch   The vessel is small in size.   . Third Diagonal Branch   The vessel is small in size.   . Third Septal Branch   The vessel is small in size.     Left Circumflex   . Prox Cx lesion, 20% stenosed. calcified discrete .   [name] Mid Cx lesion, 30% stenosed. diffuse .   [name] First Obtuse Marginal Branch   The vessel is small in size.   [name] Second Obtuse Marginal Branch   The vessel is moderate in size.   . Third Obtuse Marginal Branch   . 3rd Mrg lesion, 20% stenosed. diffuse .     Right Coronary Artery   . Mid RCA lesion, 100% stenosed. chronic total occlusion .   [name] Right Posterior Descending Artery   RPDA filled by collaterals from 3rd Sept.       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    Coronary Diagrams    Diagnostic Diagram            Implants     Name ID Temporary Type Supply   No information to display    Hemo Data       Most Recent Value   Fick Cardiac Output  7.27 L/min   Fick Cardiac Output Index  3.4 (L/min)/BSA   RA A Wave  6 mmHg   RA V Wave  7 mmHg   RA Mean  7 mmHg   RV Systolic Pressure  57 mmHg   RV Diastolic Pressure  2 mmHg   RV EDP  8 mmHg   PA Systolic Pressure  60 mmHg   PA Diastolic Pressure  16 mmHg   PA Mean  38 mmHg   PW A Wave  27 mmHg   PW V Wave  22 mmHg   PW Mean  24 mmHg   AO Systolic Pressure  123XX123 mmHg   AO Diastolic Pressure  46 mmHg   AO Mean  71 mmHg   QP/QS  1   TPVR Index  11.19 HRUI   TSVR Index  20.91 HRUI   PVR SVR Ratio  0.22   TPVR/TSVR Ratio  0.54    Order-Level Documents - 07/18/15:      Scan on 07/20/2015 8:02 PM by Deneise Lever, MDScan on 07/20/2015 8:02 PM by Deneise Lever, MD    Encounter-Level Documents - 07/18/15:      Scan on 07/22/2015 2:57 PM by Provider Default, MDScan on 07/22/2015 2:57 PM by Provider Default, MD     Scan on 07/26/2015 9:02 AM by Provider Default, [place] on 07/26/2015 9:02 AM by Provider Default, MD     Electronic signature on 07/18/2015 2:48 AM     Scan on 07/19/2015 1:34 PM by Chauncey Reading JohnsonScan on 07/19/2015 1:34 PM by Robie Ridge    Signed    Electronically signed by [name], MD on 07/22/15 at 1509 EDT   ADDENDUM REPORT: 07/25/2015 19:05  CLINICAL DATA: Aortic stenosis  EXAM: Cardiac TAVR CT  TECHNIQUE: The patient was scanned on a Philips [id number] scanner. A 120 kV retrospective scan was triggered in the descending thoracic aorta at 111 HU's. Gantry rotation speed was 270  msecs and collimation was .9 mm. 5 mg of iv Metoprolol or nitro were given. The 3D data set was reconstructed in 5% intervals of the R-R cycle. Systolic and diastolic phases were analyzed on a dedicated work station using MPR, MIP and VRT modes. The patient received 80 cc of contrast.  FINDINGS: Aortic  Valve: Trileaflet. Severely thickened and calcified with severely restricted leaflet opening.  Aorta: Minimal diffuse calcifications. Normal caliber. No dissection.  Sinotubular Junction: 36 x 33 mm  Ascending Thoracic Aorta: 38 x 36 mm  Aortic Arch: Not visualized  Descending Thoracic Aorta: 29 x 28 mm  Sinus of Valsalva Measurements:  Non-coronary: 37 mm  Right -coronary: 38 mm  Left -coronary: 40 mm  Coronary Artery Height above Annulus:  Left Main: 12 mm  Right Coronary: 16 mm  Virtual Basal Annulus Measurements:  Maximum/Minimum Diameter: 28 x 23 mm  Perimeter: 97 mm  Area: 521 mm2  Optimum Fluoroscopic Angle for Delivery: RAO 2 CRA 2  IMPRESSION: 1. Severely thickened and calcified aortic valve with severely restricted leaflet opening and annular measurements suitable for deployment of 26 mm Edward-SAPIEN 3 valve.  2. Sufficient annulus to coronary distance.  3. Optimum Fluoroscopic Angle for Delivery: RAO 2 CRA 2.  Ena Dawley   Electronically Signed  By: Ena Dawley  On: 07/25/2015 19:05    CLINICAL DATA: 74 year old male with history of severe aortic stenosis. Preprocedural study prior to potential transcatheter aortic valve replacement (TAVR).  EXAM: CT ANGIOGRAPHY CHEST, ABDOMEN AND PELVIS  TECHNIQUE: Multidetector CT imaging through the chest, abdomen and pelvis was performed using the standard protocol during bolus administration of intravenous contrast. Multiplanar reconstructed images and MIPs were obtained and reviewed to evaluate the vascular anatomy.  CONTRAST: 168mL OMNIPAQUE IOHEXOL 350 MG/ML SOLN  COMPARISON: No priors.  FINDINGS: CTA CHEST FINDINGS  Mediastinum/Lymph Nodes: Heart size is enlarged. There is no significant pericardial fluid, thickening or pericardial calcification. There is atherosclerosis of the thoracic aorta, the great vessels of the  mediastinum and the coronary arteries, including calcified atherosclerotic plaque in the left main, left anterior descending, left circumflex and right coronary arteries. Severe thickening and calcification of the aortic valve. Calcifications of the mitral valve and mitral annulus. 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 Dilatation of the pulmonic trunk (4.8 cm in diameter), suggestive of pulmonary arterial hypertension. Multiple borderline enlarged mediastinal lymph nodes measuring up to 9 mm in short axis. No definite pathologically enlarged mediastinal or hilar lymph nodes. Esophagus is unremarkable in appearance. No axillary lymphadenopathy.  Lungs/Pleura: Moderate bilateral pleural effusions layering dependently with extensive passive atelectasis in the dependent portions of the lower lobes of the lungs bilaterally. Mild diffuse ground-glass attenuation and interlobular septal thickening in the lungs suggestive of a background of mild interstitial pulmonary edema. No acute consolidative airspace disease. No suspicious appearing pulmonary nodule or mass.  Musculoskeletal/Soft Tissues: There are no aggressive appearing lytic or blastic lesions noted in the visualized portions of the skeleton.  CTA ABDOMEN AND PELVIS FINDINGS  Hepatobiliary: No cystic or solid hepatic lesions. No intra or extrahepatic biliary ductal dilatation. Status post cholecystectomy.  Pancreas: No pancreatic mass. No pancreatic ductal dilatation. No pancreatic or peripancreatic fluid or inflammatory changes.  Spleen: Unremarkable.  Adrenals/Urinary Tract: 2.2 cm left adrenal nodule is indeterminate. Right adrenal gland is normal in appearance. Exophytic 3.5 cm simple cyst in the posterior aspect of the interpolar region of the left kidney. In the  lateral interpolar region of the right kidney there is a 1.7 cm  high attenuation (74 HU) lesion which could represent an enhancing lesion or a proteinaceous/hemorrhagic cyst. No hydroureteronephrosis. Urinary bladder is remarkable for a 5 cm diverticulum in the posterior aspect of the urinary bladder on the left side.  Stomach/Bowel: The appearance of the stomach is normal. Several diverticulae of the duodenum are noted. No surrounding inflammatory changes to suggest associated duodenal diverticulitis. No pathologic dilatation of small bowel or colon. Numerous colonic diverticulae are noted, without surrounding inflammatory changes to suggest an acute diverticulitis at this time. Normal appendix.  Vascular/Lymphatic: Vascular findings and measurements pertinent to potential TAVR procedure, as detailed below. No aneurysm or dissection identified. No lymphadenopathy noted in the abdomen or pelvis.  Reproductive: Prostate gland and seminal vesicles are unremarkable in appearance.  Other: Small bilateral inguinal hernias containing only fat incidentally noted. No significant volume of ascites. No pneumoperitoneum.  Musculoskeletal: Chronic appearing compression fracture of superior endplate of L1 with approximately 15% loss of anterior vertebral body height. There are no aggressive appearing lytic or blastic lesions noted in the visualized portions of the skeleton.  VASCULAR MEASUREMENTS PERTINENT TO TAVR:  AORTA:  Minimal Aortic Diameter - 16 x 16 mm  Severity of Aortic Calcification - severe  RIGHT PELVIS:  Right Common Iliac Artery -  Minimal Diameter - 9.5 x 8.1 mm  Tortuosity - mild  Calcification - moderate  Right External Iliac Artery -  Minimal Diameter - 8.7 x 6.9 mm  Tortuosity - mild  Calcification - mild  Right Common Femoral Artery -  Minimal Diameter - Proximal 9.0 x 7.8 mm; Distal 8.6 x 3.6 mm  Tortuosity - mild  Calcification - moderate proximally, severe distally  LEFT  PELVIS:  Left Common Iliac Artery -  Minimal Diameter - 11.6 x 10.0 mm  Tortuosity - mild  Calcification - moderate  Left External Iliac Artery -  Minimal Diameter - 8.8 x 7.8 mm  Tortuosity - mild  Calcification - mild  Left Common Femoral Artery -  Minimal Diameter - Proximal 7.0 x 6.1 mm; Distal 6.5 x 5.8 mm  Tortuosity - mild  Calcification - moderate proximally, severe distally  Review of the MIP images confirms the above findings.  IMPRESSION: 1. Vascular findings and measurements pertinent to potential TAVR procedure, as detailed above. This patient does appear to have suitable pelvic arterial access bilaterally, particularly if vascular access is achieved in the proximal common femoral arteries (distal common femoral arteries are not suitable for vascular access). 2. Severe thickening calcifications of the aortic valve, compatible with the reported clinical history of aortic stenosis. 3. Severe dilatation of the pulmonic trunk and main pulmonary arteries, suggestive of pulmonary arterial hypertension. 4. Cardiomegaly with evidence of mild interstitial pulmonary edema and moderate bilateral pleural effusions, suggesting congestive heart failure. Numerous borderline enlarged mediastinal lymph nodes are likely secondary to underlying congestive heart failure. 5. 2.2 cm left adrenal nodule is indeterminate. Statistically, this is likely to represent a small adenoma, and could be further characterized with nonemergent noncontrast CT the abdomen. 6. 1.7 cm high attenuation lesion in the interpolar region of the right kidney may represent either a proteinaceous/hemorrhagic cyst or a solid enhancing lesion. Ideally, this would be characterized by MRI, however, given the patient's pacemaker, further evaluation with nonemergent renal ultrasound is strongly recommended at this time. Alternatively, if this lesion is of similar high attenuation on  the followup noncontrast CT the abdomen (recommended for number 6 above), no  further characterization may be needed. 7. Additional incidental findings, as above.   Electronically Signed  By: Vinnie Langton M.D.  On: 07/23/2015 16:01         Impression:  He has stage D severe symptomatic aortic stenosis and multivessel coronary artery disease presenting with progressive symptoms of substernal chest tightness and shortness of breath consistent with unstable angina and chronic diastolic congestive heart failure, [place] [employer] functional class IV. I have personally reviewed his most recent echo, cath and CT studies. He has severe stenosis of a trileaflet aortic valve with a chronically occluded RCA and moderate LAD stenosis. I agree that he would be a very high risk patient for open surgical AVR due to his age, renal failure and other comorbid factors. I think TAVR would be a much better option for him. He is a candidate for a 26 mm Sapien 3 valve. His pelvic vasculature appears adequate for a transfemoral approach from the proximal common femoral arteries. .  Following the decision to proceed with transcatheter aortic valve replacement, a discussion has been held regarding what types of management strategies would be attempted intraoperatively in the event of life-threatening complications, including whether or not the patient would be considered a candidate for the use of cardiopulmonary bypass and/or conversion to open sternotomy for attempted surgical intervention.  The patient, his wife, daughter and son have been advised of a variety of complications that might develop including but not limited to risks of death, stroke, paravalvular leak, aortic dissection or other major vascular complications, aortic annulus rupture, device embolization, cardiac rupture or perforation, mitral regurgitation, acute myocardial infarction, arrhythmia, heart block or bradycardia requiring  permanent pacemaker placement, congestive heart failure, respiratory failure, renal failure, pneumonia, infection, other late complications related to structural valve deterioration or migration, or other complications that might ultimately cause a temporary or permanent loss of functional independence or other long term morbidity.  The patient provides full informed consent for the procedure as described and all questions were answered. He will probably need to remain hospitalized and undergo TAVR next Tuesday Sept 6 since he is weak and short of breath with minimal activity and will probably not do well if discharged.    Plan:  Transfemoral TAVR 08/02/2015.    Gaye Pollack, MD 07/26/2015

## 2015-07-26 NOTE — Progress Notes (Signed)
Patient Demographics:    Jeffrey [name], is a 74 y.o. male, DOB - 06/15/1941, ZN:[id number]  Admit date - 07/18/2015   Admitting Physician Theressa Millard, MD  Outpatient Primary MD for the patient is Thurman Coyer, MD  LOS - 8   Chief Complaint  Patient presents with  . Chest Pain  . Shortness of Breath      Summary  This is a pleasant 74 year old Caucasian male with history of ESRD on dialysis, severe aortic stenosis, running diastolic dysfunction, type 2 diabetes mellitus who was admitted to the hospital with chest pain and shortness of breath due to acute on chronic diastolic CHF worsened by severe aortic stenosis. He's been seen by cardiology and nephrology, dialysis for fluid removal. He needs TAVR procedure as soon as possible and cardiology is working diligently on it.   Subjective:    Jeffrey [name] today has, No headache, No chest pain, No abdominal pain -  No new weakness tingling or numbness, No Cough - he is to have intermittent shortness of breath, mild nausea this morning.   Assessment  & Plan :     Acute respiratory failure with hypoxia due to acute on chronic diastolic CHF with EF [id number] underlying severe aortic stenosis in a patient with ESRD.  Dialysis for fluid removal, renal and cardiology on board, blood pressure borderline so admitted to drain to augment fluid removal during dialysis procedures. Further management per cardiology and renal. Supportive care with oxygen nebulizer treatment to continue. Beta blocker as tolerated by blood pressure.    Severe aortic stenosis. Cardiology on board, looking into TAV R procedure, likely Monday ?, Appears quite frail and simply best option will be to complete the procedure and then discharge. Discussed with cardiology personally on  07/25/2015. They will try to do the TAV R procedure as soon as possible.   Chest pain in a setting of established coronary artery disease/Pericarditis  Likely due to pericarditis-->conservative management per cardiology, continue aspirin and Plavix.Elevated troponin due to demand ischemia in the setting of ESRD and decompensated CHF. L heart Cath on 07-22-15 stable as below.    Paroxysmal atrial fibrillation [place] VASC 2 score - 2. Patient is going in and out of atrial fibrillation during this admission , on aspirin and Plavix. Cardiology on board will defer further management of cardiac issues to cardiology. Per cardiology poor candidate for anticoagulation.   Incidental L Adrenal and R Kidney lesions CT scan ordered by cardiology - follow with PCP age appropriate outpatient workup.   Hypertension - BP currently soft, low-dose beta blocker if tolerated.   Hyperlipidemia - on home dose statin   Tic removal night of 07/19/2015. Unknown duration of tick exposure, given prophylaxis with doxycycline 200 mg 1 dose, Lyme serology -ve and [facility] serology IgM -ve IgG +ve, D/W ID - Dr Dairl Ponder,  this likely is past infection and since asymptomatic no further treatment..   Diabetes mellitus type 2 - last A1c on 04/27/2015 6.7. Currently on sliding scale along with low-dose Levemir will monitor.   CBG (last 3)   Recent Labs  07/25/15 1647 07/25/15 2121 07/26/15 0631  GLUCAP 158* 164* 198*    Mild nausea on 07/26/2015. Likely due to hypotension and poor perfusion, placed in  flat position from sitting, improved, KUB stable last BM last night. Abdominal exam stable. Zofran as needed.   Code Status : Full, condition remains guarded  Family Communication  : Family bedside  Disposition Plan  : To be discharged after his TAVR procedure, too tenuous to discharge before that.  Consults  :  Cards, Renal ID Dr. De Burrs over the phone  Procedures  :   CT Angela chest abdomen pelvis ordered  by cardiology for possible TAVR procedure  L Heart Cath -   Mid RCA lesion, 100% stenosed with distal vessel filling from left to right collaterals.  Prox Cx lesion, 20% stenosed.  Mid Cx lesion, 30% stenosed.  3rd Mrg lesion, 20% stenosed.  Mid LAD lesion, 50% stenosed.  Ost 2nd Diag lesion, 40% stenosed.  1. 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 This does not appear to be flow limiting. Chronic occlusion mid RCA with filling of the distal vessel from left to right collaterals.  2. Severe aortic stenosis by echo and cardiac cath in November. I did not cross the aortic valve today.   Recommendations: Continue planning for AVR vs TAVR. He will not need coronary intervention before the aortic valve procedure. He may be able to be discharged this weekend and have follow up in the valve clinic to finalize planning for the aortic valve procedure.   DVT Prophylaxis  :  Heparin   Lab Results  Component Value Date   PLT 357 07/23/2015    Inpatient Medications  Scheduled Meds: . antiseptic oral rinse  7 mL Mouth Rinse BID  . aspirin  325 mg Oral Daily  . calcium carbonate  3 tablet Oral TID WC  . clopidogrel  75 mg Oral Daily  . darbepoetin (ARANESP) injection - DIALYSIS  100 mcg Intravenous Q Mon-HD  . ferric gluconate (FERRLECIT/NULECIT) IV  62.5 mg Intravenous Q Fri-HD  . heparin  5,000 Units Subcutaneous 3 times per day  . insulin aspart  0-9 Units Subcutaneous TID WC  . metoprolol tartrate  12.5 mg Oral BID  . midodrine  10 mg Oral TID WC  . multivitamin  1 tablet Oral QHS   Continuous Infusions:   PRN Meds:.sodium chloride, acetaminophen **OR** [DISCONTINUED] acetaminophen, albuterol, alteplase, feeding supplement (NEPRO CARB STEADY), heparin, HYDROcodone-homatropine, lidocaine (PF), lidocaine-prilocaine, metoprolol, nitroGLYCERIN, [DISCONTINUED] ondansetron **OR** ondansetron (ZOFRAN) IV,  pentafluoroprop-tetrafluoroeth  Antibiotics  :    Anti-infectives    Start     Dose/Rate Route Frequency Ordered Stop   07/21/15 1400  doxycycline (VIBRA-TABS) tablet 200 mg     200 mg Oral  Once 07/21/15 1027 07/21/15 1421   07/19/15 2200  doxycycline (VIBRA-TABS) tablet 100 mg  Status:  Discontinued     100 mg Oral Every 12 hours 07/19/15 2129 07/21/15 1027        Objective:   Filed Vitals:   07/25/15 1620 07/25/15 2114 07/26/15 0625 07/26/15 1100  BP:  116/60 75/42 93/52   Pulse:  85 72 79  Temp:  98.2 F (36.8 C) 97.8 F (36.6 C)   TempSrc:  Oral Oral   Resp:  20 20   Height:      Weight:   94.666 kg (208 lb 11.2 oz)   SpO2: 93% 94% 91%     Wt Readings from Last 3 Encounters:  07/26/15 94.666 kg (208 lb 11.2 oz)  07/12/15 99.4 kg (219 lb 2.2 oz)  07/07/15 100.608 kg (221 lb 12.8 oz)  Intake/Output Summary (Last 24 hours) at 07/26/15 1107 Last data filed at 07/26/15 0751  Gross per 24 hour  Intake    460 ml  Output      0 ml  Net    460 ml     Physical Exam  Awake Alert, Oriented X 3, No new F.N deficits, Normal affect [place].AT,PERRAL Supple Neck,No JVD, No cervical lymphadenopathy appriciated.  Symmetrical Chest wall movement, Good air movement bilaterally, basilar wheezing RRR,No Gallops,Rubs, loud aortic systolic Murmur, No Parasternal Heave +ve B.Sounds, Abd Soft, No tenderness, No organomegaly appriciated, No rebound - guarding or rigidity. No Cyanosis, Clubbing or edema, No new Rash or bruise       Data Review:   Micro Results No results found for this or any previous visit (from the past 240 hour(s)).  Radiology Reports Dg Chest 2 View  07/18/2015   CLINICAL DATA:  Cough, chest pain and shortness of breath tonight. Recent pacemaker placement.  EXAM: CHEST  2 VIEW  COMPARISON:  07/12/2015  FINDINGS: Dual lead right-sided pacemaker, leads intact. No pneumothorax. Cardiomegaly is unchanged. Increased perivascular haziness suggestive of pulmonary  edema. Probable bilateral pleural effusions with blunting of the costophrenic angles and associated bibasilar atelectasis. No acute osseous abnormalities are seen.  IMPRESSION: Small pleural effusions and perivascular haziness suggestive of pulmonary edema. Recommend correlation for CHF.   Electronically Signed   By: Jeb Levering M.D.   On: 07/18/2015 02:08   Dg Chest 2 View  07/12/2015   CLINICAL DATA:  Status post pacemaker placement, low right shoulder pain  EXAM: CHEST - 2 VIEW  COMPARISON:  04/27/2015  FINDINGS: Cardiac shadow is stable. Lungs are well aerated bilaterally. No focal infiltrate or sizable effusion is noted. Mild central vascular congestion is noted without interstitial edema. A pacing device is now seen. No pneumothorax is noted. No bony abnormality is seen.  IMPRESSION: Mild vascular congestion.  Status post pacemaker without pneumothorax.   Electronically Signed   By: Inez Catalina M.D.   On: 07/12/2015 07:41   Ct Coronary Morp W/cta Cor W/score W/ca W/cm &/or Wo/cm  07/25/2015   ADDENDUM REPORT: 07/25/2015 19:05  CLINICAL DATA:  Aortic stenosis  EXAM: Cardiac TAVR CT  TECHNIQUE: The patient was scanned on a Philips 256 scanner. A 120 kV retrospective scan was triggered in the descending thoracic aorta at 111 HU's. Gantry rotation speed was 270 msecs and collimation was .9 mm. 5 mg of iv Metoprolol or nitro were given. The 3D data set was reconstructed in 5% intervals of the R-R cycle. Systolic and diastolic phases were analyzed on a dedicated work station using MPR, MIP and VRT modes. The patient received 80 cc of contrast.  FINDINGS: Aortic Valve: Trileaflet. Severely thickened and calcified with severely restricted leaflet opening.  Aorta: Minimal diffuse calcifications. Normal caliber. No dissection.  Sinotubular Junction:  36 x 33 mm  Ascending Thoracic Aorta:  38 x 36 mm  Aortic Arch:  Not visualized  Descending Thoracic Aorta:  29 x 28 mm  Sinus of Valsalva Measurements:   Non-coronary:  37 mm  Right -coronary:  38 mm  Left -coronary:  40 mm  Coronary Artery Height above Annulus:  Left Main:  12 mm  Right Coronary:  16 mm  Virtual Basal Annulus Measurements:  Maximum/Minimum Diameter:  28 x 23 mm  Perimeter:  97 mm  Area:  521 mm2  Optimum Fluoroscopic Angle for Delivery:  RAO 2 CRA 2  IMPRESSION: 1. Severely thickened and calcified aortic  valve with severely restricted leaflet opening and annular measurements suitable for deployment of 26 mm Edward-SAPIEN 3 valve.  2.  Sufficient annulus to coronary distance.  3.  Optimum Fluoroscopic Angle for Delivery:  RAO 2 CRA 2.  Ena Dawley   Electronically Signed   By: Ena Dawley   On: 07/25/2015 19:05   07/25/2015   EXAM: OVER-READ INTERPRETATION  CT CHEST  The following report is an over-read performed by radiologist Dr. Fonnie Birkenhead [facility] on 07/25/2015. This over-read does not include interpretation of cardiac or coronary anatomy or pathology. The coronary calcium score/coronary CTA interpretation by the cardiologist is attached.  COMPARISON:  07/21/2015.  FINDINGS: Pulmonary arteries are rather enlarged. Low right paratracheal lymph nodes measure up to 11 mm, likely reactive. Visualized portions of the lungs show septal thickening with a focal area of new consolidation in the subpleural lingula. Bilateral effusions and compressive atelectasis in both lower lobes, incompletely imaged.  IMPRESSION: 1. Congestive heart failure. 2. New infectious or inflammatory consolidation in the lingula. 3. Pulmonary arterial hypertension.  Electronically Signed: By: Lorin Picket M.D. On: 07/25/2015 15:19   Dg Abd Portable 1v  07/26/2015   CLINICAL DATA:  Nausea  EXAM: PORTABLE ABDOMEN - 1 VIEW  COMPARISON:  Abdominal CT from 5 days ago  FINDINGS: Small bilateral kidneys with high-density appearance from IV contrast yesterday. Contrast is seen in the urinary bladder, including large diverticulum. Nonobstructive bowel  gas pattern. No concerning intra-abdominal mass effect. Cholecystectomy.  IMPRESSION: 1. Nonobstructive bowel gas pattern. 2. Large bladder diverticulum.   Electronically Signed   By: Monte Fantasia M.D.   On: 07/26/2015 08:51   Ct Angio Chest Aorta W/cm &/or Wo/cm  07/23/2015   CLINICAL DATA:  74 year old male with history of severe aortic stenosis. Preprocedural study prior to potential transcatheter aortic valve replacement (TAVR).  EXAM: CT ANGIOGRAPHY CHEST, ABDOMEN AND PELVIS  TECHNIQUE: Multidetector CT imaging through the chest, abdomen and pelvis was performed using the standard protocol during bolus administration of intravenous contrast. Multiplanar reconstructed images and MIPs were obtained and reviewed to evaluate the vascular anatomy.  CONTRAST:  177mL OMNIPAQUE IOHEXOL 350 MG/ML SOLN  COMPARISON:  No priors.  FINDINGS: CTA CHEST FINDINGS  Mediastinum/Lymph Nodes: Heart size is enlarged. There is no significant pericardial fluid, thickening or pericardial calcification. There is atherosclerosis of the thoracic aorta, the great vessels of the mediastinum and the coronary arteries, including calcified atherosclerotic plaque in the left main, left anterior descending, left circumflex and right coronary arteries. Severe thickening and calcification of the aortic valve. Calcifications of the mitral valve and mitral annulus. 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 Dilatation of the pulmonic trunk (4.8 cm in diameter), suggestive of pulmonary arterial hypertension. Multiple borderline enlarged mediastinal lymph nodes measuring up to 9 mm in short axis. No definite pathologically enlarged mediastinal or hilar lymph nodes. Esophagus is unremarkable in appearance. No axillary lymphadenopathy.  Lungs/Pleura: Moderate bilateral pleural effusions layering dependently with extensive passive atelectasis in the dependent  portions of the lower lobes of the lungs bilaterally. Mild diffuse ground-glass attenuation and interlobular septal thickening in the lungs suggestive of a background of mild interstitial pulmonary edema. No acute consolidative airspace disease. No suspicious appearing pulmonary nodule or mass.  Musculoskeletal/Soft Tissues: There are no aggressive appearing lytic or blastic lesions noted in the visualized portions of the skeleton.  CTA ABDOMEN AND PELVIS FINDINGS  Hepatobiliary: No cystic or solid  hepatic lesions. No intra or extrahepatic biliary ductal dilatation. Status post cholecystectomy.  Pancreas: No pancreatic mass. No pancreatic ductal dilatation. No pancreatic or peripancreatic fluid or inflammatory changes.  Spleen: Unremarkable.  Adrenals/Urinary Tract: 2.2 cm left adrenal nodule is indeterminate. Right adrenal gland is normal in appearance. Exophytic 3.5 cm simple cyst in the posterior aspect of the interpolar region of the left kidney. In the lateral interpolar region of the right kidney there is a 1.7 cm high attenuation (74 HU) lesion which could represent an enhancing lesion or a proteinaceous/hemorrhagic cyst. No hydroureteronephrosis. Urinary bladder is remarkable for a 5 cm diverticulum in the posterior aspect of the urinary bladder on the left side.  Stomach/Bowel: The appearance of the stomach is normal. Several diverticulae of the duodenum are noted. No surrounding inflammatory changes to suggest associated duodenal diverticulitis. No pathologic dilatation of small bowel or colon. Numerous colonic diverticulae are noted, without surrounding inflammatory changes to suggest an acute diverticulitis at this time. Normal appendix.  Vascular/Lymphatic: Vascular findings and measurements pertinent to potential TAVR procedure, as detailed below. No aneurysm or dissection identified. No lymphadenopathy noted in the abdomen or pelvis.  Reproductive: Prostate gland and seminal vesicles are unremarkable  in appearance.  Other: Small bilateral inguinal hernias containing only fat incidentally noted. No significant volume of ascites. No pneumoperitoneum.  Musculoskeletal: Chronic appearing compression fracture of superior endplate of L1 with approximately 15% loss of anterior vertebral body height. There are no aggressive appearing lytic or blastic lesions noted in the visualized portions of the skeleton.  VASCULAR MEASUREMENTS PERTINENT TO TAVR:  AORTA:  Minimal Aortic Diameter -  16 x 16 mm  Severity of Aortic Calcification -  severe  RIGHT PELVIS:  Right Common Iliac Artery -  Minimal Diameter - 9.5 x 8.1 mm  Tortuosity - mild  Calcification - moderate  Right External Iliac Artery -  Minimal Diameter - 8.7 x 6.9 mm  Tortuosity - mild  Calcification - mild  Right Common Femoral Artery -  Minimal Diameter - Proximal 9.0 x 7.8 mm; Distal 8.6 x 3.6 mm  Tortuosity - mild  Calcification - moderate proximally, severe distally  LEFT PELVIS:  Left Common Iliac Artery -  Minimal Diameter - 11.6 x 10.0 mm  Tortuosity - mild  Calcification - moderate  Left External Iliac Artery -  Minimal Diameter - 8.8 x 7.8 mm  Tortuosity - mild  Calcification - mild  Left Common Femoral Artery -  Minimal Diameter - Proximal 7.0 x 6.1 mm; Distal 6.5 x 5.8 mm  Tortuosity - mild  Calcification - moderate proximally, severe distally  Review of the MIP images confirms the above findings.  IMPRESSION: 1. Vascular findings and measurements pertinent to potential TAVR procedure, as detailed above. This patient does appear to have suitable pelvic arterial access bilaterally, particularly if vascular access is achieved in the proximal common femoral arteries (distal common femoral arteries are not suitable for vascular access). 2. Severe thickening calcifications of the aortic valve, compatible with the reported clinical history of aortic stenosis. 3. Severe dilatation of the pulmonic trunk and main pulmonary arteries, suggestive of pulmonary  arterial hypertension. 4. Cardiomegaly with evidence of mild interstitial pulmonary edema and moderate bilateral pleural effusions, suggesting congestive heart failure. Numerous borderline enlarged mediastinal lymph nodes are likely secondary to underlying congestive heart failure. 5. 2.2 cm left adrenal nodule is indeterminate. Statistically, this is likely to represent a small adenoma, and could be further characterized with nonemergent noncontrast CT the abdomen. 6. 1.7  cm high attenuation lesion in the interpolar region of the right kidney may represent either a proteinaceous/hemorrhagic cyst or a solid enhancing lesion. Ideally, this would be characterized by MRI, however, given the patient's pacemaker, further evaluation with nonemergent renal ultrasound is strongly recommended at this time. Alternatively, if this lesion is of similar high attenuation on the followup noncontrast CT the abdomen (recommended for number 6 above), no further characterization may be needed. 7. Additional incidental findings, as above.   Electronically Signed   By: Vinnie Langton M.D.   On: 07/23/2015 16:01   Ct Angio Abd/pel W/ And/or W/o  07/23/2015   CLINICAL DATA:  74 year old male with history of severe aortic stenosis. Preprocedural study prior to potential transcatheter aortic valve replacement (TAVR).  EXAM: CT ANGIOGRAPHY CHEST, ABDOMEN AND PELVIS  TECHNIQUE: Multidetector CT imaging through the chest, abdomen and pelvis was performed using the standard protocol during bolus administration of intravenous contrast. Multiplanar reconstructed images and MIPs were obtained and reviewed to evaluate the vascular anatomy.  CONTRAST:  162mL OMNIPAQUE IOHEXOL 350 MG/ML SOLN  COMPARISON:  No priors.  FINDINGS: CTA CHEST FINDINGS  Mediastinum/Lymph Nodes: Heart size is enlarged. There is no significant pericardial fluid, thickening or pericardial calcification. There is atherosclerosis of the thoracic aorta, the great vessels of  the mediastinum and the coronary arteries, including calcified atherosclerotic plaque in the left main, left anterior descending, left circumflex and right coronary arteries. Severe thickening and calcification of the aortic valve. Calcifications of the mitral valve and mitral annulus. 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 Dilatation of the pulmonic trunk (4.8 cm in diameter), suggestive of pulmonary arterial hypertension. Multiple borderline enlarged mediastinal lymph nodes measuring up to 9 mm in short axis. No definite pathologically enlarged mediastinal or hilar lymph nodes. Esophagus is unremarkable in appearance. No axillary lymphadenopathy.  Lungs/Pleura: Moderate bilateral pleural effusions layering dependently with extensive passive atelectasis in the dependent portions of the lower lobes of the lungs bilaterally. Mild diffuse ground-glass attenuation and interlobular septal thickening in the lungs suggestive of a background of mild interstitial pulmonary edema. No acute consolidative airspace disease. No suspicious appearing pulmonary nodule or mass.  Musculoskeletal/Soft Tissues: There are no aggressive appearing lytic or blastic lesions noted in the visualized portions of the skeleton.  CTA ABDOMEN AND PELVIS FINDINGS  Hepatobiliary: No cystic or solid hepatic lesions. No intra or extrahepatic biliary ductal dilatation. Status post cholecystectomy.  Pancreas: No pancreatic mass. No pancreatic ductal dilatation. No pancreatic or peripancreatic fluid or inflammatory changes.  Spleen: Unremarkable.  Adrenals/Urinary Tract: 2.2 cm left adrenal nodule is indeterminate. Right adrenal gland is normal in appearance. Exophytic 3.5 cm simple cyst in the posterior aspect of the interpolar region of the left kidney. In the lateral interpolar region of the right kidney there is a 1.7 cm high attenuation (74 HU) lesion which could  represent an enhancing lesion or a proteinaceous/hemorrhagic cyst. No hydroureteronephrosis. Urinary bladder is remarkable for a 5 cm diverticulum in the posterior aspect of the urinary bladder on the left side.  Stomach/Bowel: The appearance of the stomach is normal. Several diverticulae of the duodenum are noted. No surrounding inflammatory changes to suggest associated duodenal diverticulitis. No pathologic dilatation of small bowel or colon. Numerous colonic diverticulae are noted, without surrounding inflammatory changes to suggest an acute diverticulitis at this time. Normal appendix.  Vascular/Lymphatic: Vascular findings and measurements pertinent to potential TAVR procedure, as detailed below. No aneurysm or  dissection identified. No lymphadenopathy noted in the abdomen or pelvis.  Reproductive: Prostate gland and seminal vesicles are unremarkable in appearance.  Other: Small bilateral inguinal hernias containing only fat incidentally noted. No significant volume of ascites. No pneumoperitoneum.  Musculoskeletal: Chronic appearing compression fracture of superior endplate of L1 with approximately 15% loss of anterior vertebral body height. There are no aggressive appearing lytic or blastic lesions noted in the visualized portions of the skeleton.  VASCULAR MEASUREMENTS PERTINENT TO TAVR:  AORTA:  Minimal Aortic Diameter -  16 x 16 mm  Severity of Aortic Calcification -  severe  RIGHT PELVIS:  Right Common Iliac Artery -  Minimal Diameter - 9.5 x 8.1 mm  Tortuosity - mild  Calcification - moderate  Right External Iliac Artery -  Minimal Diameter - 8.7 x 6.9 mm  Tortuosity - mild  Calcification - mild  Right Common Femoral Artery -  Minimal Diameter - Proximal 9.0 x 7.8 mm; Distal 8.6 x 3.6 mm  Tortuosity - mild  Calcification - moderate proximally, severe distally  LEFT PELVIS:  Left Common Iliac Artery -  Minimal Diameter - 11.6 x 10.0 mm  Tortuosity - mild  Calcification - moderate  Left External Iliac  Artery -  Minimal Diameter - 8.8 x 7.8 mm  Tortuosity - mild  Calcification - mild  Left Common Femoral Artery -  Minimal Diameter - Proximal 7.0 x 6.1 mm; Distal 6.5 x 5.8 mm  Tortuosity - mild  Calcification - moderate proximally, severe distally  Review of the MIP images confirms the above findings.  IMPRESSION: 1. Vascular findings and measurements pertinent to potential TAVR procedure, as detailed above. This patient does appear to have suitable pelvic arterial access bilaterally, particularly if vascular access is achieved in the proximal common femoral arteries (distal common femoral arteries are not suitable for vascular access). 2. Severe thickening calcifications of the aortic valve, compatible with the reported clinical history of aortic stenosis. 3. Severe dilatation of the pulmonic trunk and main pulmonary arteries, suggestive of pulmonary arterial hypertension. 4. Cardiomegaly with evidence of mild interstitial pulmonary edema and moderate bilateral pleural effusions, suggesting congestive heart failure. Numerous borderline enlarged mediastinal lymph nodes are likely secondary to underlying congestive heart failure. 5. 2.2 cm left adrenal nodule is indeterminate. Statistically, this is likely to represent a small adenoma, and could be further characterized with nonemergent noncontrast CT the abdomen. 6. 1.7 cm high attenuation lesion in the interpolar region of the right kidney may represent either a proteinaceous/hemorrhagic cyst or a solid enhancing lesion. Ideally, this would be characterized by MRI, however, given the patient's pacemaker, further evaluation with nonemergent renal ultrasound is strongly recommended at this time. Alternatively, if this lesion is of similar high attenuation on the followup noncontrast CT the abdomen (recommended for number 6 above), no further characterization may be needed. 7. Additional incidental findings, as above.   Electronically Signed   By: Vinnie Langton  M.D.   On: 07/23/2015 16:01     CBC  Recent Labs Lab 07/20/15 0310 07/22/15 0820 07/23/15 0321  WBC 10.9* 10.6* 11.1*  HGB 9.0* 8.4* 8.5*  HCT 27.6* 25.3* 26.0*  PLT 332 345 357  MCV 92.6 93.7 94.2  MCH 30.2 31.1 30.8  MCHC 32.6 33.2 32.7  RDW 14.8 15.1 15.4    Chemistries   Recent Labs Lab 07/19/15 2025 07/20/15 0310 07/22/15 0820 07/23/15 0321 07/25/15 1948  NA 135 133* 134* 134* 132*  K 4.5 4.5 4.0 4.1 5.4*  CL 96*  92* 95* 95* 92*  CO2 27 27 28 28 23   GLUCOSE 176* 167* 154* 149* 176*  BUN 44* 52* 33* 25* 64*  CREATININE 5.16* 5.72* 4.23* 3.96* 9.52*  CALCIUM 9.3 9.3 8.6* 8.5* 9.3   ------------------------------------------------------------------------------------------------------------------ estimated creatinine clearance is 7.8 mL/min (by C-G formula based on Cr of 9.52). ------------------------------------------------------------------------------------------------------------------ No results for input(s): HGBA1C in the last 72 hours. ------------------------------------------------------------------------------------------------------------------ No results for input(s): CHOL, HDL, LDLCALC, TRIG, CHOLHDL, LDLDIRECT in the last 72 hours. ------------------------------------------------------------------------------------------------------------------ No results for input(s): TSH, T4TOTAL, T3FREE, THYROIDAB in the last 72 hours.  Invalid input(s): FREET3 ------------------------------------------------------------------------------------------------------------------ No results for input(s): VITAMINB12, FOLATE, FERRITIN, TIBC, IRON, RETICCTPCT in the last 72 hours.  Coagulation profile  Recent Labs Lab 07/22/15 0720  INR 1.28    No results for input(s): DDIMER in the last 72 hours.  Cardiac Enzymes No results for input(s): CKMB, TROPONINI, MYOGLOBIN in the last 168 hours.  Invalid input(s):  CK ------------------------------------------------------------------------------------------------------------------ Invalid input(s): POCBNP   Time Spent in minutes  35   SINGH,PRASHANT K M.D on 07/26/2015 at 11:07 AM  Between 7am to 7pm - Pager - [phone]  After 7pm go to [email or web address] - password [facility]  [facility] -  Office  [phone]

## 2015-07-26 NOTE — Progress Notes (Signed)
UR completed 

## 2015-07-26 NOTE — Progress Notes (Signed)
[facility] ROUNDING NOTE   Subjective:   Interval History: low BP and somnolent this morning  Received dilaudid  Objective:  Vital signs in last 24 hours:  Temp:  [97.5 F (36.4 C)-98.2 F (36.8 C)] 97.5 F (36.4 C) (08/30 1506) Pulse Rate:  [72-85] 83 (08/30 1552) Resp:  [20] 20 (08/30 1423) BP: (75-116)/(42-60) 92/46 mmHg (08/30 1552) SpO2:  [91 %-96 %] 96 % (08/30 1423) Weight:  [94.666 kg (208 lb 11.2 oz)] 94.666 kg (208 lb 11.2 oz) (08/30 0625)  Weight change: -0.59 kg (-1 lb 4.8 oz) Filed Weights   07/24/15 0434 07/25/15 0610 07/26/15 0625  Weight: 94.257 kg (207 lb 12.8 oz) 95.255 kg (210 lb) 94.666 kg (208 lb 11.2 oz)    Intake/Output: I/O last 3 completed shifts: In: 920 [P.O.:920] Out: 200 [Urine:200]   Intake/Output this shift:     CVS- RRR ejection systolic murmur RS- CTA no rales  ABD- BS present soft non-distended EXT- no edema   Basic Metabolic Panel:  Recent Labs Lab 07/19/15 2025 07/20/15 0310 07/22/15 0820 07/23/15 0321 07/25/15 1948  NA 135 133* 134* 134* 132*  K 4.5 4.5 4.0 4.1 5.4*  CL 96* 92* 95* 95* 92*  CO2 27 27 28 28 23   GLUCOSE 176* 167* 154* 149* 176*  BUN 44* 52* 33* 25* 64*  CREATININE 5.16* 5.72* 4.23* 3.96* 9.52*  CALCIUM 9.3 9.3 8.6* 8.5* 9.3  PHOS 4.3  --   --   --  6.8*    Liver Function Tests:  Recent Labs Lab 07/19/15 2025 07/25/15 1948  ALBUMIN 2.9* 3.3*   No results for input(s): LIPASE, AMYLASE in the last 168 hours. No results for input(s): AMMONIA in the last 168 hours.  CBC:  Recent Labs Lab 07/20/15 0310 07/22/15 0820 07/23/15 0321  WBC 10.9* 10.6* 11.1*  HGB 9.0* 8.4* 8.5*  HCT 27.6* 25.3* 26.0*  MCV 92.6 93.7 94.2  PLT 332 345 357    Cardiac Enzymes: No results for input(s): CKTOTAL, CKMB, CKMBINDEX, TROPONINI in the last 168 hours.  BNP: Invalid input(s): POCBNP  CBG:  Recent Labs Lab 07/25/15 2121 07/26/15 0631 07/26/15 1128 07/26/15 1453 07/26/15 1639   GLUCAP 164* 198* 173* 173* 148*    Microbiology: Results for orders placed or performed during the hospital encounter of 07/11/15  Surgical pcr screen     Status: None   Collection Time: 07/11/15 11:02 AM  Result Value Ref Range Status   MRSA, PCR NEGATIVE NEGATIVE Final   Staphylococcus aureus NEGATIVE NEGATIVE Final    Comment:        The Xpert SA Assay (FDA approved for NASAL specimens in patients over 12 years of age), is one component of a comprehensive surveillance program.  Test performance has been validated by [facility] for patients greater than or equal to 60 year old. It is not intended to diagnose infection nor to guide or monitor treatment.     Coagulation Studies: No results for input(s): LABPROT, INR in the last 72 hours.  Urinalysis: No results for input(s): COLORURINE, LABSPEC, PHURINE, GLUCOSEU, HGBUR, BILIRUBINUR, KETONESUR, PROTEINUR, UROBILINOGEN, NITRITE, LEUKOCYTESUR in the last 72 hours.  Invalid input(s): APPERANCEUR    Imaging: Ct Coronary Morp W/cta Cor W/score W/ca W/cm &/or Wo/cm  07/25/2015   ADDENDUM REPORT: 07/25/2015 19:05  CLINICAL DATA:  Aortic stenosis  EXAM: Cardiac TAVR CT  TECHNIQUE: The patient was scanned on a Philips 256 scanner. A 120 kV retrospective scan was triggered in the descending thoracic  aorta at 111 HU's. Gantry rotation speed was 270 msecs and collimation was .9 mm. 5 mg of iv Metoprolol or nitro were given. The 3D data set was reconstructed in 5% intervals of the R-R cycle. Systolic and diastolic phases were analyzed on a dedicated work station using MPR, MIP and VRT modes. The patient received 80 cc of contrast.  FINDINGS: Aortic Valve: Trileaflet. Severely thickened and calcified with severely restricted leaflet opening.  Aorta: Minimal diffuse calcifications. Normal caliber. No dissection.  Sinotubular Junction:  36 x 33 mm  Ascending Thoracic Aorta:  38 x 36 mm  Aortic Arch:  Not visualized  Descending Thoracic Aorta:   29 x 28 mm  Sinus of Valsalva Measurements:  Non-coronary:  37 mm  Right -coronary:  38 mm  Left -coronary:  40 mm  Coronary Artery Height above Annulus:  Left Main:  12 mm  Right Coronary:  16 mm  Virtual Basal Annulus Measurements:  Maximum/Minimum Diameter:  28 x 23 mm  Perimeter:  97 mm  Area:  521 mm2  Optimum Fluoroscopic Angle for Delivery:  RAO 2 CRA 2  IMPRESSION: 1. Severely thickened and calcified aortic valve with severely restricted leaflet opening and annular measurements suitable for deployment of 26 mm Edward-SAPIEN 3 valve.  2.  Sufficient annulus to coronary distance.  3.  Optimum Fluoroscopic Angle for Delivery:  RAO 2 CRA 2.  Ena Dawley   Electronically Signed   By: Ena Dawley   On: 07/25/2015 19:05   07/25/2015   EXAM: OVER-READ INTERPRETATION  CT CHEST  The following report is an over-read performed by radiologist Dr. Fonnie Birkenhead [facility] on 07/25/2015. This over-read does not include interpretation of cardiac or coronary anatomy or pathology. The coronary calcium score/coronary CTA interpretation by the cardiologist is attached.  COMPARISON:  07/21/2015.  FINDINGS: Pulmonary arteries are rather enlarged. Low right paratracheal lymph nodes measure up to 11 mm, likely reactive. Visualized portions of the lungs show septal thickening with a focal area of new consolidation in the subpleural lingula. Bilateral effusions and compressive atelectasis in both lower lobes, incompletely imaged.  IMPRESSION: 1. Congestive heart failure. 2. New infectious or inflammatory consolidation in the lingula. 3. Pulmonary arterial hypertension.  Electronically Signed: By: Lorin Picket M.D. On: 07/25/2015 15:19   Dg Abd Portable 1v  07/26/2015   CLINICAL DATA:  Nausea  EXAM: PORTABLE ABDOMEN - 1 VIEW  COMPARISON:  Abdominal CT from 5 days ago  FINDINGS: Small bilateral kidneys with high-density appearance from IV contrast yesterday. Contrast is seen in the urinary bladder,  including large diverticulum. Nonobstructive bowel gas pattern. No concerning intra-abdominal mass effect. Cholecystectomy.  IMPRESSION: 1. Nonobstructive bowel gas pattern. 2. Large bladder diverticulum.   Electronically Signed   By: Monte Fantasia M.D.   On: 07/26/2015 08:51     Medications:     . antiseptic oral rinse  7 mL Mouth Rinse BID  . aspirin  325 mg Oral Daily  . calcium carbonate  3 tablet Oral TID WC  . clopidogrel  75 mg Oral Daily  . darbepoetin (ARANESP) injection - DIALYSIS  100 mcg Intravenous Q Mon-HD  . darbepoetin (ARANESP) injection - DIALYSIS  100 mcg Intravenous Once  . ferric gluconate (FERRLECIT/NULECIT) IV  62.5 mg Intravenous Q Fri-HD  . heparin  5,000 Units Subcutaneous 3 times per day  . insulin aspart  0-9 Units Subcutaneous TID WC  . metoprolol tartrate  12.5 mg Oral BID  . midodrine  10 mg  Oral TID WC  . multivitamin  1 tablet Oral QHS   sodium chloride, acetaminophen **OR** [DISCONTINUED] acetaminophen, albuterol, alteplase, feeding supplement (NEPRO CARB STEADY), heparin, HYDROcodone-homatropine, lidocaine (PF), lidocaine-prilocaine, metoprolol, nitroGLYCERIN, [DISCONTINUED] ondansetron **OR** ondansetron (ZOFRAN) IV, pentafluoroprop-tetrafluoroeth  Assessment/ Plan:   ESRD- MWF dialysis refused yesterday  ANEMIA- stable  MBD- binders and vit D  HTN/VOL- aortic stenosis difficult to assess  ACCESS- AVF       LOS: 8 Vicente Weidler W @TODAY @7 :19 PM

## 2015-07-26 NOTE — Progress Notes (Signed)
Patient Name: Jeffrey [name] Date of Encounter: 07/26/2015  Principal Problem:   Acute respiratory failure Active Problems:   DM (diabetes mellitus), type 2   HLD (hyperlipidemia)   CAD (coronary artery disease)   Chest pain   Fluid overload   ESRD on hemodialysis   Acute on chronic diastolic CHF (congestive heart failure)   Severe aortic stenosis   Hypervolemia   Aortic stenosis  SUBJECTIVE  Declined dialysis yesterday, likely today. K of 5.4. Feels weak. Able to sleep for getting dilauded around 4am. Now discontinued due to hypotension.   CURRENT MEDS . antiseptic oral rinse  7 mL Mouth Rinse BID  . aspirin  325 mg Oral Daily  . calcium carbonate  3 tablet Oral TID WC  . clopidogrel  75 mg Oral Daily  . darbepoetin (ARANESP) injection - DIALYSIS  100 mcg Intravenous Q Mon-HD  . darbepoetin (ARANESP) injection - DIALYSIS  100 mcg Intravenous Once  . ferric gluconate (FERRLECIT/NULECIT) IV  62.5 mg Intravenous Q Fri-HD  . heparin  5,000 Units Subcutaneous 3 times per day  . insulin aspart  0-9 Units Subcutaneous TID WC  . metoprolol tartrate  12.5 mg Oral BID  . midodrine  10 mg Oral TID WC  . multivitamin  1 tablet Oral QHS    OBJECTIVE  Filed Vitals:   07/25/15 1620 07/25/15 2114 07/26/15 0625 07/26/15 1100  BP:  116/60 75/42 93/52   Pulse:  85 72 79  Temp:  98.2 F (36.8 C) 97.8 F (36.6 C)   TempSrc:  Oral Oral   Resp:  20 20   Height:      Weight:   208 lb 11.2 oz (94.666 kg)   SpO2: 93% 94% 91%     Intake/Output Summary (Last 24 hours) at 07/26/15 1141 Last data filed at 07/26/15 0751  Gross per 24 hour  Intake    460 ml  Output      0 ml  Net    460 ml   Filed Weights   07/24/15 0434 07/25/15 0610 07/26/15 0625  Weight: 207 lb 12.8 oz (94.257 kg) 210 lb (95.255 kg) 208 lb 11.2 oz (94.666 kg)    PHYSICAL EXAM  General: Chronically ill appearing in  NAD. Neuro: Alert and oriented X 3. Moves all extremities spontaneously. Psych: Normal  affect. HEENT:  Normal  Neck: Supple without bruits or JVD. Lungs:  Resp regular and unlabored. Diminished breath sound at bases Heart: RRR. Systolic murmurs. Abdomen: Soft, non-tender, non-distended, BS + x 4.  Extremities: No clubbing, cyanosis or edema. DP/PT/Radials 2+ and equal bilaterally.  Accessory Clinical Findings  CBC No results for input(s): WBC, NEUTROABS, HGB, HCT, MCV, PLT in the last 72 hours. Basic Metabolic Panel  Recent Labs  07/25/15 1948  NA 132*  K 5.4*  CL 92*  CO2 23  GLUCOSE 176*  BUN 64*  CREATININE 9.52*  CALCIUM 9.3  PHOS 6.8*   Liver Function Tests  Recent Labs  07/25/15 1948  ALBUMIN 3.3*    TELE  Paced Rhythm  Radiology/Studies Cardiac gated chest CT IMPRESSION: 1. Severely thickened and calcified aortic valve with severely restricted leaflet opening and annular measurements suitable for deployment of 26 mm Edward-SAPIEN 3 valve.  2. Sufficient annulus to coronary distance.  3. Optimum Fluoroscopic Angle for Delivery: RAO 2 CRA 2.  ASSESSMENT AND PLAN     1. Severe aortic stenosis, now symptomatic. Being evaluated by multidisciplinary heart valve team.  - Had cardiac CT showed everely  thickened and calcified aortic valve with severely restricted leaflet opening and annular measurements suitable for deployment of 26 mm Edward-SAPIEN 3 valve. Dr. Burt Knack will discuss further the timing of TAVR and approach with Dr. Roxy Manns  2. CAD with RCA-CTO and collateral filling - Continue ASA, lopressor and plavix.   3. Dual chamber permanent pacemaker, recently placed.  - paced rhythm  4. ESRD - per nephrology  5. Acute respiratory failure with hypoxia - Currently in not in acute distress  Signed, Bhagat,Bhavinkumar PA-C Pager [phone]  History and all data above reviewed.  Patient examined.  I agree with the findings as above.  The patient has acute episodes of SOB.  He does not have crackles on exam.  He gets very  uncomfortable and panting and does have pain.  Sats have been low 90s on [place] 2L  The patient exam reveals COR:RRR  ,  Lungs: Decreased breath sounds but no wheezing  ,  Abd: Positive bowel sounds, no rebound no guarding, Ext No edema  .  All available labs, radiology testing, previous records reviewed. Agree with documented assessment and plan.   AS:  Discussed with Dr. Burt Knack.  The soonest that they can do his procedure is next Tuesday.  The patient does not think that he can leave because of his acute episodes of dyspnea.  Continue to monitor in house.    [name] Dorian Duval  12:01 PM  07/26/2015

## 2015-07-27 ENCOUNTER — Inpatient Hospital Stay (HOSPITAL_COMMUNITY): Payer: Medicare Other

## 2015-07-27 DIAGNOSIS — E875 Hyperkalemia: Secondary | ICD-10-CM

## 2015-07-27 LAB — COMPREHENSIVE METABOLIC PANEL
ALBUMIN: 3.2 g/dL — AB (ref 3.5–5.0)
ALT: 1562 U/L — AB (ref 17–63)
AST: 2886 U/L — AB (ref 15–41)
Alkaline Phosphatase: 113 U/L (ref 38–126)
Anion gap: 26 — ABNORMAL HIGH (ref 5–15)
BUN: 84 mg/dL — AB (ref 6–20)
CHLORIDE: 90 mmol/L — AB (ref 101–111)
CO2: 16 mmol/L — AB (ref 22–32)
CREATININE: 11.84 mg/dL — AB (ref 0.61–1.24)
Calcium: 9.2 mg/dL (ref 8.9–10.3)
GFR calc Af Amer: 4 mL/min — ABNORMAL LOW (ref >60)
GFR calc non Af Amer: 4 mL/min — ABNORMAL LOW (ref >60)
GLUCOSE: 172 mg/dL — AB (ref 65–99)
POTASSIUM: 7.2 mmol/L — AB (ref 3.5–5.1)
SODIUM: 132 mmol/L — AB (ref 135–145)
Total Bilirubin: 1.6 mg/dL — ABNORMAL HIGH (ref 0.3–1.2)
Total Protein: 6.9 g/dL (ref 6.5–8.1)

## 2015-07-27 LAB — GLUCOSE, CAPILLARY
GLUCOSE-CAPILLARY: 176 mg/dL — AB (ref 65–99)
GLUCOSE-CAPILLARY: 114 mg/dL — AB (ref 65–99)
GLUCOSE-CAPILLARY: 116 mg/dL — AB (ref 65–99)
Glucose-Capillary: 110 mg/dL — ABNORMAL HIGH (ref 65–99)

## 2015-07-27 LAB — CBC
HCT: 25.8 % — ABNORMAL LOW (ref 39.0–52.0)
Hemoglobin: 8.2 g/dL — ABNORMAL LOW (ref 13.0–17.0)
MCH: 30.4 pg (ref 26.0–34.0)
MCHC: 31.8 g/dL (ref 30.0–36.0)
MCV: 95.6 fL (ref 78.0–100.0)
PLATELETS: 358 10*3/uL (ref 150–400)
RBC: 2.7 MIL/uL — AB (ref 4.22–5.81)
RDW: 17.9 % — AB (ref 11.5–15.5)
WBC: 13.3 10*3/uL — AB (ref 4.0–10.5)

## 2015-07-27 LAB — LIPASE, BLOOD: LIPASE: 27 U/L (ref 22–51)

## 2015-07-27 MED ORDER — DIPHENOXYLATE-ATROPINE 2.5-0.025 MG PO TABS
1.0000 | ORAL_TABLET | Freq: Once | ORAL | Status: AC
  Administered 2015-07-27: 1 via ORAL
  Filled 2015-07-27: qty 1

## 2015-07-27 MED ORDER — HYDROCODONE-ACETAMINOPHEN 5-325 MG PO TABS
1.0000 | ORAL_TABLET | ORAL | Status: AC | PRN
  Administered 2015-07-27 – 2015-07-31 (×2): 1 via ORAL
  Filled 2015-07-27 (×2): qty 1

## 2015-07-27 MED ORDER — DARBEPOETIN ALFA 100 MCG/0.5ML IJ SOSY
PREFILLED_SYRINGE | INTRAMUSCULAR | Status: AC
  Filled 2015-07-27: qty 0.5

## 2015-07-27 NOTE — Progress Notes (Signed)
Pt no c/o diarrhea/loose stool x2 but flushed stool before showing RN. Also complaining of upper back pain and bilateral arm pain. Pt states he has a dull pain "all over". Pt states he has been having more back pain over the past few days. K kirby notified and new orders given. Medicine explained to wife and patient, last BP 111/53. Pt with small loose stool observed by RN, Ordered medicine given to patient.   Pt also refusing to wear CPAP over night, per wife pt wears only if in respiratory distress. Pt on 6Lnc with O2 sats in mid 90s.

## 2015-07-27 NOTE — Progress Notes (Signed)
CRITICAL VALUE ALERT  Critical value received:  Potassium 7.2  Date of notification:  07/27/2015   Time of notification:  0647  Critical value read back:Yes.    Nurse who received alert:  Ronnette Hila, RN  MD notified (1st page):  Tylene Fantasia, NP  Time of first page:  [phone]  Responding MD:  Tylene Fantasia, NP  Time MD responded:  06:50  K Baltazar Najjar also notified of elevated AST and ALT.   HD notified of critical value and to come get patient for dialysis per Tylene Fantasia, NP. Patient and wife updated on plan of care. BP 102/51, O2 98%on 2L, pt A/Ox4, still complaining of abdominal pain. Dr. Carles Collet to bedside to see patient and updated on lab results and events overnight. Patient transported to HD via bed and report given to HD RN. Central telemetry notified. Report given to day shift RN.

## 2015-07-27 NOTE — Procedures (Signed)
I have seen and examined this patient and agree with the plan of care  Left arm fistula   Blood pressure stable Pearl Bents W 07/27/2015, 9:16 AM

## 2015-07-27 NOTE — Progress Notes (Signed)
PT Cancellation Note  Patient Details Name: Jeffrey [name] MRN: [id number] DOB: [birth year]   Cancelled Treatment:    Reason Eval/Treat Not Completed: Other (comment). Pt adamantly refused PT for mobility and at bed level. Will check back tomorrow.   [place], [place] 07/27/2015, 1:59 PM

## 2015-07-27 NOTE — Progress Notes (Signed)
Pt taken up to HD via bed 0745 on O2 at 6L MD spoke to patient and wife at the bedside.

## 2015-07-27 NOTE — Progress Notes (Signed)
Patient Name: Jeffrey [name] Date of Encounter: 07/27/2015   SUBJECTIVE  Went for a abdominal US. Per family patient was feeling well after HD this morning. Abdominal pain improved. TVAR early next week.   CURRENT MEDS . antiseptic oral rinse  7 mL Mouth Rinse BID  . aspirin  325 mg Oral Daily  . calcium carbonate  3 tablet Oral TID WC  . clopidogrel  75 mg Oral Daily  . darbepoetin (ARANESP) injection - DIALYSIS  100 mcg Intravenous Q Mon-HD  . ferric gluconate (FERRLECIT/NULECIT) IV  62.5 mg Intravenous Q Fri-HD  . heparin  5,000 Units Subcutaneous 3 times per day  . insulin aspart  0-9 Units Subcutaneous TID WC  . metoprolol tartrate  12.5 mg Oral BID  . midodrine  10 mg Oral TID WC  . multivitamin  1 tablet Oral QHS    OBJECTIVE  Filed Vitals:   07/27/15 1000 07/27/15 1030 07/27/15 1100 07/27/15 1119  BP: 115/55 111/59 112/61 111/51  Pulse: 85 91 91 90  Temp:    97.6 F (36.4 C)  TempSrc:    Oral  Resp:    16  Height:      Weight:      SpO2:    98%    Intake/Output Summary (Last 24 hours) at 07/27/15 1201 Last data filed at 07/27/15 1119  Gross per 24 hour  Intake    120 ml  Output      0 ml  Net    120 ml   Filed Weights   07/26/15 0625 07/27/15 0526 07/27/15 0745  Weight: 208 lb 11.2 oz (94.666 kg) 202 lb 2.6 oz (91.7 kg) 206 lb 12.7 oz (93.8 kg)    PHYSICAL EXAM  General: Chronically ill appearing in NAD. Neuro: Alert and oriented X 3. Moves all extremities spontaneously. Psych: Normal affect. HEENT: Normal Neck: Supple without bruits or JVD. Lungs: Resp regular and unlabored. Diminished breath sound at bases Heart: RRR. Systolic murmurs. Abdomen: Soft, non-tender, non-distended, BS + x 4.  Extremities: No clubbing, cyanosis or edema. DP/PT/Radials 2+ and equal bilaterally.  Accessory Clinical Findings  CBC  Recent Labs  07/27/15 0345  WBC 13.3*  HGB 8.2*  HCT 25.8*  MCV 95.6  PLT 123456   Basic Metabolic Panel  Recent  Labs  07/25/15 1948 07/27/15 0345  NA 132* 132*  K 5.4* 7.2*  CL 92* 90*  CO2 23 16*  GLUCOSE 176* 172*  BUN 64* 84*  CREATININE 9.52* 11.84*  CALCIUM 9.3 9.2  PHOS 6.8*  --    Liver Function Tests  Recent Labs  07/25/15 1948 07/27/15 0345  AST  --  2886*  ALT  --  1562*  ALKPHOS  --  113  BILITOT  --  1.6*  PROT  --  6.9  ALBUMIN 3.3* 3.2*    Recent Labs  07/27/15 0841  LIPASE 27    TELE  Paced rhythm   ASSESSMENT AND PLAN  1. Severe aortic stenosis, now symptomatic. Being evaluated by multidisciplinary heart valve team.  - Had cardiac CT showed everely thickened and calcified aortic valve with severely restricted leaflet opening and annular measurements suitable for deployment of 26 mm Edward-SAPIEN 3 valve. Also concerning for possible pneumonia?  - TVAR tentatively planned 08/02/15  2. CAD with RCA-CTO and collateral filling - Continue ASA, lopressor and plavix.   3. Dual chamber permanent pacemaker, recently placed.  - paced rhythm  4. ESRD - K of 7.2, had dialysis earlier  today. No event on tele.   5. Acute respiratory failure with hypoxia - Currently in not in acute distress  6. Abdominal pain - Significantly elevated AST and ALT. Pending abdominal US.  Signed, Leanor Kail PA-C   Patient seen, examined. Available data reviewed. Agree with findings, assessment, and plan as outlined by [name], PA-C. The patient was independently interviewed and examined. There are multiple family members at the bedside. I also discussed his case with his wife over the telephone.  On exam, the patient is elderly and in no distress. Jugular venous pressure is mildly elevated. Lung fields are clear with the exception of mildly diminished breath sounds in the bases. Heart is regular rate and rhythm with a grade 4/6 harsh late peaking systolic murmur with absent A2. There is no pretibial edema.  The patient has stage D severe symptomatic aortic stenosis  with acute on chronic diastolic heart failure. He is clinically improved after hemodialysis this morning. Of note he had marked hyperkalemia after refusing dialysis earlier this week. He also has significant transaminitis which may be due to hypotension (shock liver). He does appear clinically improved at present. I have reviewed treatment options once again with the patient and his family. Because of his progressive heart failure and need for recurrent hospitalizations, they are very reluctant for him to leave the hospital before undergoing aortic valve replacement. The patient is not a candidate for conventional aortic valve surgery because of his severe comorbid conditions. As long as he is clinically stable, will plan on TAVR next week. Will follow along until that time to make sure there are no medical contraindications to undergoing TAVR. Will repeat his liver function tests later this week. Note his abdominal ultrasound is negative for any acute pathology. Plan discussed at length with the patient and his family.  [name], M.D. 07/27/2015 5:41 PM

## 2015-07-27 NOTE — Progress Notes (Signed)
PROGRESS NOTE  Jeffrey [name] [id number] DOB: [birth year] DOA: 07/18/2015 PCP: Thurman Coyer, MD  Brief History 74 year old Caucasian male with history of ESRD on dialysis, severe aortic stenosis, running diastolic dysfunction, type 2 diabetes mellitus who was admitted to the hospital with chest pain and shortness of breath due to acute on chronic diastolic CHF worsened by severe aortic stenosis. He's been seen by cardiology and nephrology, dialysis for fluid removal. The patient underwent left heart catheterization 07/22/2015 which showed 100% mid RCA, 50% mid LAD.  He is tentatively scheduled for TAVR on 08/02/15 Assessment/Plan: Acute respiratory failure with hypoxia due to acute on chronic diastolic CHF with EF [id number] underlying severe aortic stenosis in a patient with ESRD.  -Dialysis for fluid removal, renal and cardiology on board, - blood pressure borderline--continue  Midodrine -Appreciate  cardiology and renal.  -Supportive care with oxygen nebulizer treatment to continue.  -Beta blocker as tolerated by blood pressure. -Presently stable on 6 L nasal cannula  Severe aortic stenosis. Cardiology on board, looking into TAV R procedure, likely Monday ?, Appears quite frail and simply best option will be to complete the procedure and then discharge.  -TAVR planned 08/02/15  Hyperkalemia -Potassium 7.2, telemetry did not reveal any changes -Patient taken to dialysis early morning 07/27/2015 -Dialysis was not performed on 07/25/2015; last dialysis was 07/22/2015   Transaminasemia -suspect due to transient episodes of hypotension -trend LFTs -07/18/2015 hepatitis B surface antigen negative  -Check hepatitis C antibody  -Check lipase  -Abdominal ultrasound  -Ehrlichia antibodies; repeat RMSF antibodies -8/25--received doxy 200mg  x 1  Chest pain in a setting of established coronary artery disease/Pericarditis Likely due to pericarditis-->conservative management per  cardiology, continue aspirin and Plavix.Elevated troponin due to demand ischemia in the setting of ESRD and decompensated CHF. L heart Cath on 07-22-15 stable as below.    Paroxysmal atrial fibrillation [place] VASC 2 score - 2. Patient is going in and out of atrial fibrillation during this admission , on aspirin and Plavix. Cardiology on board will defer further management of cardiac issues to cardiology. Per cardiology poor candidate for anticoagulation.   Incidental L Adrenal and R Kidney lesions CT scan ordered by cardiology - follow with PCP age appropriate outpatient workup.   Hypertension - BP currently soft, low-dose beta blocker if tolerated.   Hyperlipidemia - on home dose statin   Tic removal night of 07/19/2015. Unknown duration of tick exposure, given prophylaxis with doxycycline 200 mg 1 dose, Lyme serology -ve and [facility] serology IgM -ve IgG +ve, D/W ID - Dr Linus Salmons, this likely is past infection and since asymptomatic no further treatment..   Diabetes mellitus type 2 - last A1c on 04/27/2015 6.7. Currently on sliding scale along with low-dose Levemir will monitor.       Family Communication:   Wife updated at beside Disposition Plan:   In patient through TAVR 9/6       Procedures/Studies: Dg Chest 2 View  07/18/2015   CLINICAL DATA:  Cough, chest pain and shortness of breath tonight. Recent pacemaker placement.  EXAM: CHEST  2 VIEW  COMPARISON:  07/12/2015  FINDINGS: Dual lead right-sided pacemaker, leads intact. No pneumothorax. Cardiomegaly is unchanged. Increased perivascular haziness suggestive of pulmonary edema. Probable bilateral pleural effusions with blunting of the costophrenic angles and associated bibasilar atelectasis. No acute osseous abnormalities are seen.  IMPRESSION: Small pleural effusions and perivascular haziness suggestive of pulmonary edema. Recommend correlation for CHF.  Electronically Signed   By: Jeb Levering M.D.   On:  07/18/2015 02:08   Dg Chest 2 View  07/12/2015   CLINICAL DATA:  Status post pacemaker placement, low right shoulder pain  EXAM: CHEST - 2 VIEW  COMPARISON:  04/27/2015  FINDINGS: Cardiac shadow is stable. Lungs are well aerated bilaterally. No focal infiltrate or sizable effusion is noted. Mild central vascular congestion is noted without interstitial edema. A pacing device is now seen. No pneumothorax is noted. No bony abnormality is seen.  IMPRESSION: Mild vascular congestion.  Status post pacemaker without pneumothorax.   Electronically Signed   By: Inez Catalina M.D.   On: 07/12/2015 07:41   Ct Coronary Morp W/cta Cor W/score W/ca W/cm &/or Wo/cm  07/25/2015   ADDENDUM REPORT: 07/25/2015 19:05  CLINICAL DATA:  Aortic stenosis  EXAM: Cardiac TAVR CT  TECHNIQUE: The patient was scanned on a Philips 256 scanner. A 120 kV retrospective scan was triggered in the descending thoracic aorta at 111 HU's. Gantry rotation speed was 270 msecs and collimation was .9 mm. 5 mg of iv Metoprolol or nitro were given. The 3D data set was reconstructed in 5% intervals of the R-R cycle. Systolic and diastolic phases were analyzed on a dedicated work station using MPR, MIP and VRT modes. The patient received 80 cc of contrast.  FINDINGS: Aortic Valve: Trileaflet. Severely thickened and calcified with severely restricted leaflet opening.  Aorta: Minimal diffuse calcifications. Normal caliber. No dissection.  Sinotubular Junction:  36 x 33 mm  Ascending Thoracic Aorta:  38 x 36 mm  Aortic Arch:  Not visualized  Descending Thoracic Aorta:  29 x 28 mm  Sinus of Valsalva Measurements:  Non-coronary:  37 mm  Right -coronary:  38 mm  Left -coronary:  40 mm  Coronary Artery Height above Annulus:  Left Main:  12 mm  Right Coronary:  16 mm  Virtual Basal Annulus Measurements:  Maximum/Minimum Diameter:  28 x 23 mm  Perimeter:  97 mm  Area:  521 mm2  Optimum Fluoroscopic Angle for Delivery:  RAO 2 CRA 2  IMPRESSION: 1. Severely thickened  and calcified aortic valve with severely restricted leaflet opening and annular measurements suitable for deployment of 26 mm Edward-SAPIEN 3 valve.  2.  Sufficient annulus to coronary distance.  3.  Optimum Fluoroscopic Angle for Delivery:  RAO 2 CRA 2.  Ena Dawley   Electronically Signed   By: Ena Dawley   On: 07/25/2015 19:05   07/25/2015   EXAM: OVER-READ INTERPRETATION  CT CHEST  The following report is an over-read performed by radiologist Dr. Fonnie Birkenhead [facility] on 07/25/2015. This over-read does not include interpretation of cardiac or coronary anatomy or pathology. The coronary calcium score/coronary CTA interpretation by the cardiologist is attached.  COMPARISON:  07/21/2015.  FINDINGS: Pulmonary arteries are rather enlarged. Low right paratracheal lymph nodes measure up to 11 mm, likely reactive. Visualized portions of the lungs show septal thickening with a focal area of new consolidation in the subpleural lingula. Bilateral effusions and compressive atelectasis in both lower lobes, incompletely imaged.  IMPRESSION: 1. Congestive heart failure. 2. New infectious or inflammatory consolidation in the lingula. 3. Pulmonary arterial hypertension.  Electronically Signed: By: Lorin Picket M.D. On: 07/25/2015 15:19   Dg Abd Portable 1v  07/26/2015   CLINICAL DATA:  Nausea  EXAM: PORTABLE ABDOMEN - 1 VIEW  COMPARISON:  Abdominal CT from 5 days ago  FINDINGS: Small bilateral kidneys with high-density appearance from  IV contrast yesterday. Contrast is seen in the urinary bladder, including large diverticulum. Nonobstructive bowel gas pattern. No concerning intra-abdominal mass effect. Cholecystectomy.  IMPRESSION: 1. Nonobstructive bowel gas pattern. 2. Large bladder diverticulum.   Electronically Signed   By: Monte Fantasia M.D.   On: 07/26/2015 08:51   Ct Angio Chest Aorta W/cm &/or Wo/cm  07/23/2015   CLINICAL DATA:  74 year old male with history of severe aortic  stenosis. Preprocedural study prior to potential transcatheter aortic valve replacement (TAVR).  EXAM: CT ANGIOGRAPHY CHEST, ABDOMEN AND PELVIS  TECHNIQUE: Multidetector CT imaging through the chest, abdomen and pelvis was performed using the standard protocol during bolus administration of intravenous contrast. Multiplanar reconstructed images and MIPs were obtained and reviewed to evaluate the vascular anatomy.  CONTRAST:  152mL OMNIPAQUE IOHEXOL 350 MG/ML SOLN  COMPARISON:  No priors.  FINDINGS: CTA CHEST FINDINGS  Mediastinum/Lymph Nodes: Heart size is enlarged. There is no significant pericardial fluid, thickening or pericardial calcification. There is atherosclerosis of the thoracic aorta, the great vessels of the mediastinum and the coronary arteries, including calcified atherosclerotic plaque in the left main, left anterior descending, left circumflex and right coronary arteries. Severe thickening and calcification of the aortic valve. Calcifications of the mitral valve and mitral annulus. 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 Dilatation of the pulmonic trunk (4.8 cm in diameter), suggestive of pulmonary arterial hypertension. Multiple borderline enlarged mediastinal lymph nodes measuring up to 9 mm in short axis. No definite pathologically enlarged mediastinal or hilar lymph nodes. Esophagus is unremarkable in appearance. No axillary lymphadenopathy.  Lungs/Pleura: Moderate bilateral pleural effusions layering dependently with extensive passive atelectasis in the dependent portions of the lower lobes of the lungs bilaterally. Mild diffuse ground-glass attenuation and interlobular septal thickening in the lungs suggestive of a background of mild interstitial pulmonary edema. No acute consolidative airspace disease. No suspicious appearing pulmonary nodule or mass.  Musculoskeletal/Soft Tissues: There are no aggressive  appearing lytic or blastic lesions noted in the visualized portions of the skeleton.  CTA ABDOMEN AND PELVIS FINDINGS  Hepatobiliary: No cystic or solid hepatic lesions. No intra or extrahepatic biliary ductal dilatation. Status post cholecystectomy.  Pancreas: No pancreatic mass. No pancreatic ductal dilatation. No pancreatic or peripancreatic fluid or inflammatory changes.  Spleen: Unremarkable.  Adrenals/Urinary Tract: 2.2 cm left adrenal nodule is indeterminate. Right adrenal gland is normal in appearance. Exophytic 3.5 cm simple cyst in the posterior aspect of the interpolar region of the left kidney. In the lateral interpolar region of the right kidney there is a 1.7 cm high attenuation (74 HU) lesion which could represent an enhancing lesion or a proteinaceous/hemorrhagic cyst. No hydroureteronephrosis. Urinary bladder is remarkable for a 5 cm diverticulum in the posterior aspect of the urinary bladder on the left side.  Stomach/Bowel: The appearance of the stomach is normal. Several diverticulae of the duodenum are noted. No surrounding inflammatory changes to suggest associated duodenal diverticulitis. No pathologic dilatation of small bowel or colon. Numerous colonic diverticulae are noted, without surrounding inflammatory changes to suggest an acute diverticulitis at this time. Normal appendix.  Vascular/Lymphatic: Vascular findings and measurements pertinent to potential TAVR procedure, as detailed below. No aneurysm or dissection identified. No lymphadenopathy noted in the abdomen or pelvis.  Reproductive: Prostate gland and seminal vesicles are unremarkable in appearance.  Other: Small bilateral inguinal hernias containing only fat incidentally noted. No significant volume of ascites. No pneumoperitoneum.  Musculoskeletal: Chronic appearing compression fracture  of superior endplate of L1 with approximately 15% loss of anterior vertebral body height. There are no aggressive appearing lytic or blastic  lesions noted in the visualized portions of the skeleton.  VASCULAR MEASUREMENTS PERTINENT TO TAVR:  AORTA:  Minimal Aortic Diameter -  16 x 16 mm  Severity of Aortic Calcification -  severe  RIGHT PELVIS:  Right Common Iliac Artery -  Minimal Diameter - 9.5 x 8.1 mm  Tortuosity - mild  Calcification - moderate  Right External Iliac Artery -  Minimal Diameter - 8.7 x 6.9 mm  Tortuosity - mild  Calcification - mild  Right Common Femoral Artery -  Minimal Diameter - Proximal 9.0 x 7.8 mm; Distal 8.6 x 3.6 mm  Tortuosity - mild  Calcification - moderate proximally, severe distally  LEFT PELVIS:  Left Common Iliac Artery -  Minimal Diameter - 11.6 x 10.0 mm  Tortuosity - mild  Calcification - moderate  Left External Iliac Artery -  Minimal Diameter - 8.8 x 7.8 mm  Tortuosity - mild  Calcification - mild  Left Common Femoral Artery -  Minimal Diameter - Proximal 7.0 x 6.1 mm; Distal 6.5 x 5.8 mm  Tortuosity - mild  Calcification - moderate proximally, severe distally  Review of the MIP images confirms the above findings.  IMPRESSION: 1. Vascular findings and measurements pertinent to potential TAVR procedure, as detailed above. This patient does appear to have suitable pelvic arterial access bilaterally, particularly if vascular access is achieved in the proximal common femoral arteries (distal common femoral arteries are not suitable for vascular access). 2. Severe thickening calcifications of the aortic valve, compatible with the reported clinical history of aortic stenosis. 3. Severe dilatation of the pulmonic trunk and main pulmonary arteries, suggestive of pulmonary arterial hypertension. 4. Cardiomegaly with evidence of mild interstitial pulmonary edema and moderate bilateral pleural effusions, suggesting congestive heart failure. Numerous borderline enlarged mediastinal lymph nodes are likely secondary to underlying congestive heart failure. 5. 2.2 cm left adrenal nodule is indeterminate. Statistically, this is  likely to represent a small adenoma, and could be further characterized with nonemergent noncontrast CT the abdomen. 6. 1.7 cm high attenuation lesion in the interpolar region of the right kidney may represent either a proteinaceous/hemorrhagic cyst or a solid enhancing lesion. Ideally, this would be characterized by MRI, however, given the patient's pacemaker, further evaluation with nonemergent renal ultrasound is strongly recommended at this time. Alternatively, if this lesion is of similar high attenuation on the followup noncontrast CT the abdomen (recommended for number 6 above), no further characterization may be needed. 7. Additional incidental findings, as above.   Electronically Signed   By: Vinnie Langton M.D.   On: 07/23/2015 16:01   Ct Angio Abd/pel W/ And/or W/o  07/23/2015   CLINICAL DATA:  74 year old male with history of severe aortic stenosis. Preprocedural study prior to potential transcatheter aortic valve replacement (TAVR).  EXAM: CT ANGIOGRAPHY CHEST, ABDOMEN AND PELVIS  TECHNIQUE: Multidetector CT imaging through the chest, abdomen and pelvis was performed using the standard protocol during bolus administration of intravenous contrast. Multiplanar reconstructed images and MIPs were obtained and reviewed to evaluate the vascular anatomy.  CONTRAST:  129mL OMNIPAQUE IOHEXOL 350 MG/ML SOLN  COMPARISON:  No priors.  FINDINGS: CTA CHEST FINDINGS  Mediastinum/Lymph Nodes: Heart size is enlarged. There is no significant pericardial fluid, thickening or pericardial calcification. There is atherosclerosis of the thoracic aorta, the great vessels of the mediastinum and the coronary arteries, including calcified atherosclerotic plaque in  the left main, left anterior descending, left circumflex and right coronary arteries. Severe thickening and calcification of the aortic valve. Calcifications of the mitral valve and mitral annulus. Right-sided pacemaker device in position with lead tips terminating  in the right atrial appendage and right ventricular apex. Dilatation of the pulmonic trunk (4.8 cm in diameter), suggestive of pulmonary arterial hypertension. Multiple borderline enlarged mediastinal lymph nodes measuring up to 9 mm in short axis. No definite pathologically enlarged mediastinal or hilar lymph nodes. Esophagus is unremarkable in appearance. No axillary lymphadenopathy.  Lungs/Pleura: Moderate bilateral pleural effusions layering dependently with extensive passive atelectasis in the dependent portions of the lower lobes of the lungs bilaterally. Mild diffuse ground-glass attenuation and interlobular septal thickening in the lungs suggestive of a background of mild interstitial pulmonary edema. No acute consolidative airspace disease. No suspicious appearing pulmonary nodule or mass.  Musculoskeletal/Soft Tissues: There are no aggressive appearing lytic or blastic lesions noted in the visualized portions of the skeleton.  CTA ABDOMEN AND PELVIS FINDINGS  Hepatobiliary: No cystic or solid hepatic lesions. No intra or extrahepatic biliary ductal dilatation. Status post cholecystectomy.  Pancreas: No pancreatic mass. No pancreatic ductal dilatation. No pancreatic or peripancreatic fluid or inflammatory changes.  Spleen: Unremarkable.  Adrenals/Urinary Tract: 2.2 cm left adrenal nodule is indeterminate. Right adrenal gland is normal in appearance. Exophytic 3.5 cm simple cyst in the posterior aspect of the interpolar region of the left kidney. In the lateral interpolar region of the right kidney there is a 1.7 cm high attenuation (74 HU) lesion which could represent an enhancing lesion or a proteinaceous/hemorrhagic cyst. No hydroureteronephrosis. Urinary bladder is remarkable for a 5 cm diverticulum in the posterior aspect of the urinary bladder on the left side.  Stomach/Bowel: The appearance of the stomach is normal. Several diverticulae of the duodenum are noted. No surrounding inflammatory changes to  suggest associated duodenal diverticulitis. No pathologic dilatation of small bowel or colon. Numerous colonic diverticulae are noted, without surrounding inflammatory changes to suggest an acute diverticulitis at this time. Normal appendix.  Vascular/Lymphatic: Vascular findings and measurements pertinent to potential TAVR procedure, as detailed below. No aneurysm or dissection identified. No lymphadenopathy noted in the abdomen or pelvis.  Reproductive: Prostate gland and seminal vesicles are unremarkable in appearance.  Other: Small bilateral inguinal hernias containing only fat incidentally noted. No significant volume of ascites. No pneumoperitoneum.  Musculoskeletal: Chronic appearing compression fracture of superior endplate of L1 with approximately 15% loss of anterior vertebral body height. There are no aggressive appearing lytic or blastic lesions noted in the visualized portions of the skeleton.  VASCULAR MEASUREMENTS PERTINENT TO TAVR:  AORTA:  Minimal Aortic Diameter -  16 x 16 mm  Severity of Aortic Calcification -  severe  RIGHT PELVIS:  Right Common Iliac Artery -  Minimal Diameter - 9.5 x 8.1 mm  Tortuosity - mild  Calcification - moderate  Right External Iliac Artery -  Minimal Diameter - 8.7 x 6.9 mm  Tortuosity - mild  Calcification - mild  Right Common Femoral Artery -  Minimal Diameter - Proximal 9.0 x 7.8 mm; Distal 8.6 x 3.6 mm  Tortuosity - mild  Calcification - moderate proximally, severe distally  LEFT PELVIS:  Left Common Iliac Artery -  Minimal Diameter - 11.6 x 10.0 mm  Tortuosity - mild  Calcification - moderate  Left External Iliac Artery -  Minimal Diameter - 8.8 x 7.8 mm  Tortuosity - mild  Calcification - mild  Left Common Femoral Artery -  Minimal Diameter - Proximal 7.0 x 6.1 mm; Distal 6.5 x 5.8 mm  Tortuosity - mild  Calcification - moderate proximally, severe distally  Review of the MIP images confirms the above findings.  IMPRESSION: 1. Vascular findings and measurements  pertinent to potential TAVR procedure, as detailed above. This patient does appear to have suitable pelvic arterial access bilaterally, particularly if vascular access is achieved in the proximal common femoral arteries (distal common femoral arteries are not suitable for vascular access). 2. Severe thickening calcifications of the aortic valve, compatible with the reported clinical history of aortic stenosis. 3. Severe dilatation of the pulmonic trunk and main pulmonary arteries, suggestive of pulmonary arterial hypertension. 4. Cardiomegaly with evidence of mild interstitial pulmonary edema and moderate bilateral pleural effusions, suggesting congestive heart failure. Numerous borderline enlarged mediastinal lymph nodes are likely secondary to underlying congestive heart failure. 5. 2.2 cm left adrenal nodule is indeterminate. Statistically, this is likely to represent a small adenoma, and could be further characterized with nonemergent noncontrast CT the abdomen. 6. 1.7 cm high attenuation lesion in the interpolar region of the right kidney may represent either a proteinaceous/hemorrhagic cyst or a solid enhancing lesion. Ideally, this would be characterized by MRI, however, given the patient's pacemaker, further evaluation with nonemergent renal ultrasound is strongly recommended at this time. Alternatively, if this lesion is of similar high attenuation on the followup noncontrast CT the abdomen (recommended for number 6 above), no further characterization may be needed. 7. Additional incidental findings, as above.   Electronically Signed   By: Vinnie Langton M.D.   On: 07/23/2015 16:01         Subjective: Patient complained of nausea and vomiting over night with epigastric discomfort. Denied any chest pain, shortness breath, fevers, chills, headache, coughing, hemoptysis. Denies any rashes. No hematochezia or melena.   Objective: Filed Vitals:   07/27/15 0022 07/27/15 0526 07/27/15 0728 07/27/15  0730  BP: 111/53 96/56 102/51   Pulse: 70 62    Temp:  97 F (36.1 C)    TempSrc:  Oral    Resp:  18    Height:      Weight:  91.7 kg (202 lb 2.6 oz)    SpO2:  98%  98%    Intake/Output Summary (Last 24 hours) at 07/27/15 0741 Last data filed at 07/26/15 2200  Gross per 24 hour  Intake    340 ml  Output      0 ml  Net    340 ml   Weight change: -2.966 kg (-6 lb 8.6 oz) Exam:   General:  Pt is alert, follows commands appropriately, not in acute distress  HEENT: No icterus, No thrush, No neck mass, [place]/AT; no meningismus   Cardiovascular: RRR, S1/S2, no rubs, no gallops  Respiratory: Bibasilar crackles. No wheeze.   Abdomen: Soft/+BS, epigastric tender without guarding, non distended, no guarding  Extremities: No edema, No lymphangitis, No petechiae, No rashes, no synovitis  Data Reviewed: Basic Metabolic Panel:  Recent Labs Lab 07/22/15 0820 07/23/15 0321 07/25/15 1948 07/27/15 0345  NA 134* 134* 132* 132*  K 4.0 4.1 5.4* 7.2*  CL 95* 95* 92* 90*  CO2 28 28 23  16*  GLUCOSE 154* 149* 176* 172*  BUN 33* 25* 64* 84*  CREATININE 4.23* 3.96* 9.52* 11.84*  CALCIUM 8.6* 8.5* 9.3 9.2  PHOS  --   --  6.8*  --    Liver Function Tests:  Recent Labs Lab 07/25/15 1948 07/27/15 0345  AST  --  2886*  ALT  --  1562*  ALKPHOS  --  113  BILITOT  --  1.6*  PROT  --  6.9  ALBUMIN 3.3* 3.2*   No results for input(s): LIPASE, AMYLASE in the last 168 hours. No results for input(s): AMMONIA in the last 168 hours. CBC:  Recent Labs Lab 07/22/15 0820 07/23/15 0321 07/27/15 0345  WBC 10.6* 11.1* 13.3*  HGB 8.4* 8.5* 8.2*  HCT 25.3* 26.0* 25.8*  MCV 93.7 94.2 95.6  PLT 345 357 358   Cardiac Enzymes: No results for input(s): CKTOTAL, CKMB, CKMBINDEX, TROPONINI in the last 168 hours. BNP: Invalid input(s): POCBNP CBG:  Recent Labs Lab 07/26/15 1128 07/26/15 1453 07/26/15 1639 07/26/15 2214 07/27/15 0542  GLUCAP 173* 173* 148* 164* 176*    No results  found for this or any previous visit (from the past 240 hour(s)).   Scheduled Meds: . antiseptic oral rinse  7 mL Mouth Rinse BID  . aspirin  325 mg Oral Daily  . calcium carbonate  3 tablet Oral TID WC  . clopidogrel  75 mg Oral Daily  . darbepoetin (ARANESP) injection - DIALYSIS  100 mcg Intravenous Q Mon-HD  . darbepoetin (ARANESP) injection - DIALYSIS  100 mcg Intravenous Once  . ferric gluconate (FERRLECIT/NULECIT) IV  62.5 mg Intravenous Q Fri-HD  . heparin  5,000 Units Subcutaneous 3 times per day  . insulin aspart  0-9 Units Subcutaneous TID WC  . metoprolol tartrate  12.5 mg Oral BID  . midodrine  10 mg Oral TID WC  . multivitamin  1 tablet Oral QHS   Continuous Infusions:    Labrenda Lasky, DO  [facility] Pager [phone]  If 7PM-7AM, please contact night-coverage [email or web address] Password [email or web address] 07/27/2015, 7:41 AM   LOS: 9 days

## 2015-07-27 NOTE — Progress Notes (Signed)
PT Cancellation Note  Patient Details Name: Jeffrey [name] MRN: [id number] DOB: Feb 18, 1941   Cancelled Treatment:    Reason Eval/Treat Not Completed: Patient at procedure or test/unavailable in the AM, will check back this afternoon.   [place], [place] 07/27/2015, 1:09 PM

## 2015-07-28 DIAGNOSIS — R740 Nonspecific elevation of levels of transaminase and lactic acid dehydrogenase [LDH]: Secondary | ICD-10-CM

## 2015-07-28 DIAGNOSIS — K761 Chronic passive congestion of liver: Secondary | ICD-10-CM

## 2015-07-28 DIAGNOSIS — I5033 Acute on chronic diastolic (congestive) heart failure: Secondary | ICD-10-CM

## 2015-07-28 LAB — EHRLICHIA ANTIBODY PANEL
E CHAFFEENSIS AB, IGG: NEGATIVE
E chaffeensis (HGE) Ab, IgM: NEGATIVE
E. Chaffeensis (HME) IgM Titer: NEGATIVE
E.Chaffeensis (HME) IgG: NEGATIVE

## 2015-07-28 LAB — GLUCOSE, CAPILLARY
GLUCOSE-CAPILLARY: 113 mg/dL — AB (ref 65–99)
Glucose-Capillary: 152 mg/dL — ABNORMAL HIGH (ref 65–99)
Glucose-Capillary: 160 mg/dL — ABNORMAL HIGH (ref 65–99)
Glucose-Capillary: 131 mg/dL — ABNORMAL HIGH (ref 65–99)

## 2015-07-28 LAB — CBC
HCT: 24.1 % — ABNORMAL LOW (ref 39.0–52.0)
Hemoglobin: 7.8 g/dL — ABNORMAL LOW (ref 13.0–17.0)
MCH: 31.3 pg (ref 26.0–34.0)
MCHC: 32.4 g/dL (ref 30.0–36.0)
MCV: 96.8 fL (ref 78.0–100.0)
PLATELETS: 258 10*3/uL (ref 150–400)
RBC: 2.49 MIL/uL — AB (ref 4.22–5.81)
RDW: 18 % — ABNORMAL HIGH (ref 11.5–15.5)
WBC: 11.5 10*3/uL — ABNORMAL HIGH (ref 4.0–10.5)

## 2015-07-28 LAB — HEPATITIS C ANTIBODY

## 2015-07-28 MED ORDER — ONDANSETRON HCL 4 MG/2ML IJ SOLN
4.0000 mg | Freq: Once | INTRAMUSCULAR | Status: AC
Start: 2015-07-28 — End: 2015-07-28
  Administered 2015-07-28: 4 mg via INTRAVENOUS
  Filled 2015-07-28: qty 2

## 2015-07-28 NOTE — Progress Notes (Signed)
Physical Therapy Treatment Patient Details Name: Jeffrey [name] MRN: [id number] DOB: [birth year] Today's Date: 07/28/2015    History of Present Illness Jeffrey [name] is a 74 y.o. male with a history of ESRD on HD, P. Afib, CAD, HTN and DM2 and recent Pacemaker placement who presents to the ED with complaints of increased SOB and Cough over the past 24 hours. He reports chest pain associated with coughing. With sever AS now planned TAV R.    PT Comments    Pt declining transfers or mobility at this time due to fatigue. Spent session education pt and family on importance of mobility and building up strength/endurance even before procedure takes place. Both verbalized understanding and family supportive. Demonstrated exercises pt can do either in seated or supine position and all verbalized understanding and agreement. Also encouraged ambulation to bathroom with nursing staff as pt has been refusing PT sessions as of late.    Follow Up Recommendations  Home health PT     Equipment Recommendations  Rolling walker with 5" wheels    Recommendations for Other Services       Precautions / Restrictions Precautions Precautions: Fall Restrictions Weight Bearing Restrictions: No    Mobility  Bed Mobility               General bed mobility comments: Pt up in recliner  Transfers                 General transfer comment: Pt refused to perform transfers this session due to fatigue and c/o nausea  Ambulation/Gait             General Gait Details: Pt refused gait/mobility this session   Stairs            Wheelchair Mobility    Modified Rankin (Stroke Patients Only)       Balance                                    Cognition Arousal/Alertness: Awake/alert Behavior During Therapy: WFL for tasks assessed/performed Overall Cognitive Status: Within Functional Limits for tasks assessed                      Exercises       General Comments General comments (skin integrity, edema, etc.): Session focused on education on importance of mobility and therex to prepare for procedure for overall endurance/strengthening. Family at bedside also. PT demonstrated exercises in seated and supine position for BLE to address strength including LAQ, isometric hip adduction, heel slides, hip abduction, and ankle pumps with recommendation for 10 reps each side      Pertinent Vitals/Pain Pain Assessment: No/denies pain    Home Living                      Prior Function            PT Goals (current goals can now be found in the care plan section) Acute Rehab PT Goals PT Goal Formulation: With patient/family Time For Goal Achievement: 08/04/15 Potential to Achieve Goals: Fair Progress towards PT goals: Not progressing toward goals - comment (pt has been refusing sessions)    Frequency  Min 3X/week    PT Plan Current plan remains appropriate    Co-evaluation             End of Session   Activity Tolerance: Patient  limited by fatigue Patient left: in chair;with call bell/phone within reach;with family/visitor present     Time: [id number] PT Time Calculation (min) (ACUTE ONLY): 9 min  Charges:  $Self Care/Home Management: 8-22                    G Codes:      Juanna Cao, PT, DPT Pager #: [phone]  07/28/2015, 2:12 PM

## 2015-07-28 NOTE — Progress Notes (Signed)
PT Cancellation Note  Patient Details Name: Jeffrey [name] MRN: [id number] DOB: [birth year]   Cancelled Treatment:    Reason Eval/Treat Not Completed: Other (comment) Pt refused to work with PT this AM stating "I told them yesterday I wasn't ready." Pt declined OOB at this time and stated he would get up at some point today but was too tired this morning. Will check back again with pt as schedule allows.   Canary Brim Ivory Broad, PT, DPT Pager #: [phone]  07/28/2015, 8:16 AM

## 2015-07-28 NOTE — Progress Notes (Signed)
    Subjective:  Feeling better today. States he's been sitting up most of day. Family at bedside. No CP or shortness of breath at rest.  Objective:  Vital Signs in the last 24 hours: Temp:  [98 F (36.7 C)-98.1 F (36.7 C)] 98.1 F (36.7 C) (09/01 1353) Pulse Rate:  [70-83] 70 (09/01 1353) Resp:  [16] 16 (09/01 1353) BP: (94-112)/(39-67) 102/59 mmHg (09/01 1353) SpO2:  [98 %-100 %] 99 % (09/01 1353) Weight:  [205 lb 6.4 oz (93.169 kg)] 205 lb 6.4 oz (93.169 kg) (09/01 0628)  Intake/Output from previous day: 08/31 0701 - 09/01 0700 In: 540 [P.O.:540] Out: 0   Physical Exam: Pt is alert and oriented, NAD  Lab Results:  Recent Labs  07/27/15 0345 07/28/15 0600  WBC 13.3* 11.5*  HGB 8.2* 7.8*  PLT 358 258    Recent Labs  07/27/15 0345 07/28/15 0600  NA 132* 135  K 7.2* 5.2*  CL 90* 95*  CO2 16* 24  GLUCOSE 172* 117*  BUN 84* 58*  CREATININE 11.84* 8.81*   No results for input(s): TROPONINI in the last 72 hours.  Invalid input(s): CK, MB  Assessment/Plan:  Severe symptomatic AS with diastolic heart failure - plans for TAVR next Tuesday. Transaminase elevation has hit a plateau. GI evaluation reviewed. I am going to stop plavix in case alternative access is required for TAVR. I have reviewed all notes and cannot find a clear indication for plavix - looks like it was started recently for chest pain but no real evidence for ACS and coronary anatomy stable at cath.  [name], M.D. 07/28/2015, 6:10 PM

## 2015-07-28 NOTE — Progress Notes (Signed)
PROGRESS NOTE  ROSHAWN BARBIN [id number] DOB: 03-10-1941 DOA: 07/18/2015 PCP: Thurman Coyer, MD  Brief History 74 year old male with history of diabetes mellitus, coronary artery disease, aortic stenosis, paroxysmal atrial fibrillation, symptomatic bradycardia status post permanent pacemaker on 07/11/2015, hypertension, hyperlipidemia, stroke, ESRD presented with increasing dyspnea, chest discomfort, and cough. Chest x-ray revealed pulmonary edema with bilateral pleural effusions suggesting fluid overload. Nephrology was consulted and the patient was taken to dialysis due to acute on chronic diastolic CHF worsened by severe aortic stenosis. He's been seen by cardiology and nephrology, dialysis for fluid removal. The patient underwent left heart catheterization 07/22/2015 which showed 100% mid RCA, 50% mid LAD. He is tentatively scheduled for TAVR on 08/02/15 Assessment/Plan: Acute respiratory failure with hypoxia due to acute on chronic diastolic CHF with underlying severe aortic stenosis in a patient with ESRD.  -Dialysis for fluid removal,  -appreciate cardiology and renal followup - blood pressure borderline--continue Midodrine -Supportive care with oxygen nebulizer treatment to continue.  -Beta blocker as tolerated by blood pressure. -Presently stable on 5 L nasal cannula Acute on chronic diastolic CHF -[id number] echocardiogram EF 55-60%, severe aortic stenosis, inferior and apical HK -HD to help with fluid management -Continue metoprolol tartrate as BP tolerates -Severe aortic stenosis contributing -daily weights  Severe aortic stenosis.  -appreciate cardiology -TAVR planned 08/02/15  Hyperkalemia -07/27/15--Potassium 7.2, telemetry did not reveal any changes -Patient taken to dialysis early morning 07/27/2015 -Dialysis was not performed on 07/25/2015; last dialysis was 07/22/2015  -improved Transaminasemia -suspect due to transient episodes of hypotension and  hepatic congestion from CHF -trend LFTs -07/18/2015 hepatitis B surface antigen negative  -Check hepatitis C antibody--neg -Check lipase--27  -Abdominal ultrasound--s/p cholecystectomy, no liver lesions -Ehrlichia antibodies; repeat RMSF antibodies-pending but doubt infection -8/25--received doxy 200mg  x 1 -consult GI for opinion  Chest pain in a setting of established coronary artery disease/Pericarditis Likely due to pericarditis-->conservative management per cardiology,  -continue aspirin and Plavix. -Elevated troponin due to demand ischemia in the setting of ESRD and decompensated CHF.  -L heart Cath on 07-22-15--100% mid RCA, 50% mid LAD  Paroxysmal atrial fibrillation [place] VASC 2 score - 2.  -going in and out of atrial fibrillation during this admission -on aspirin and Plavix.   -Per cardiology poor candidate for anticoagulation. -rate controlled  Incidental L Adrenal and R Kidney lesions CT scan ordered by cardiology - follow with PCP age appropriate outpatient workup.   Hypertension - BP currently soft, low-dose beta blocker if tolerated.   Hyperlipidemia - on home dose statin   Tic removal night of 07/19/2015. Unknown duration of tick exposure, given prophylaxis with doxycycline 200 mg 1 dose, Lyme serology -ve and [facility] serology IgM -ve IgG +ve, D/W ID - Dr Linus Salmons, this likely is past infection and since asymptomatic no further treatment..   Diabetes mellitus type 2  -04/27/2015 hemoglobin A1c 6.7  -CBG controlled without Levemir at this time  -NovoLog sliding scale   Family Communication:   Wife updated at beside Disposition Plan:   Remain in hospital through 08/02/15      Procedures/Studies: Dg Chest 2 View  07/27/2015   CLINICAL DATA:  Pneumonia, CHF, chest pain and shortness of breath, but not today  EXAM: CHEST  2 VIEW  COMPARISON:  07/25/2015  FINDINGS: Bilateral interstitial thickening with prominence of the central pulmonary vasculature  bilateral small pleural effusions. No pneumothorax. Stable cardiomegaly. Thoracic aortic atherosclerosis. No acute osseous abnormality. Dual lead cardiac  pacemaker.  IMPRESSION: Mild CHF.   Electronically Signed   By: Kathreen Devoid   On: 07/27/2015 17:36   Dg Chest 2 View  07/18/2015   CLINICAL DATA:  Cough, chest pain and shortness of breath tonight. Recent pacemaker placement.  EXAM: CHEST  2 VIEW  COMPARISON:  07/12/2015  FINDINGS: Dual lead right-sided pacemaker, leads intact. No pneumothorax. Cardiomegaly is unchanged. Increased perivascular haziness suggestive of pulmonary edema. Probable bilateral pleural effusions with blunting of the costophrenic angles and associated bibasilar atelectasis. No acute osseous abnormalities are seen.  IMPRESSION: Small pleural effusions and perivascular haziness suggestive of pulmonary edema. Recommend correlation for CHF.   Electronically Signed   By: Jeb Levering M.D.   On: 07/18/2015 02:08   Dg Chest 2 View  07/12/2015   CLINICAL DATA:  Status post pacemaker placement, low right shoulder pain  EXAM: CHEST - 2 VIEW  COMPARISON:  04/27/2015  FINDINGS: Cardiac shadow is stable. Lungs are well aerated bilaterally. No focal infiltrate or sizable effusion is noted. Mild central vascular congestion is noted without interstitial edema. A pacing device is now seen. No pneumothorax is noted. No bony abnormality is seen.  IMPRESSION: Mild vascular congestion.  Status post pacemaker without pneumothorax.   Electronically Signed   By: Inez Catalina M.D.   On: 07/12/2015 07:41   US Abdomen Complete  07/27/2015   CLINICAL DATA:  Mid abdominal pain, elevated LFTs  EXAM: ULTRASOUND ABDOMEN COMPLETE  COMPARISON:  07/21/2015  FINDINGS: Gallbladder: Surgically absent  Common bile duct: Diameter: 5.5 mm  Liver: No focal lesion identified. Within normal limits in parenchymal echogenicity.  IVC: No abnormality visualized.  Pancreas: Obscured by bowel gas  Spleen: Size and appearance  within normal limits.  Right Kidney: Length: 9.7 cm.  9.6 cm  Left Kidney: Length: 3 cm simple cyst lower pole. Echogenicity within normal limits. No mass or hydronephrosis visualized.  Abdominal aorta: Largely obscured by bowel gas  Other findings: Bilateral pleural effusions  IMPRESSION: Study is somewhat limited. No acute findings identified other than bilateral pleural effusions.   Electronically Signed   By: Skipper Cliche M.D.   On: 07/27/2015 12:42   Ct Coronary Morp W/cta Cor W/score W/ca W/cm &/or Wo/cm  07/25/2015   ADDENDUM REPORT: 07/25/2015 19:05  CLINICAL DATA:  Aortic stenosis  EXAM: Cardiac TAVR CT  TECHNIQUE: The patient was scanned on a Philips 256 scanner. A 120 kV retrospective scan was triggered in the descending thoracic aorta at 111 HU's. Gantry rotation speed was 270 msecs and collimation was .9 mm. 5 mg of iv Metoprolol or nitro were given. The 3D data set was reconstructed in 5% intervals of the R-R cycle. Systolic and diastolic phases were analyzed on a dedicated work station using MPR, MIP and VRT modes. The patient received 80 cc of contrast.  FINDINGS: Aortic Valve: Trileaflet. Severely thickened and calcified with severely restricted leaflet opening.  Aorta: Minimal diffuse calcifications. Normal caliber. No dissection.  Sinotubular Junction:  36 x 33 mm  Ascending Thoracic Aorta:  38 x 36 mm  Aortic Arch:  Not visualized  Descending Thoracic Aorta:  29 x 28 mm  Sinus of Valsalva Measurements:  Non-coronary:  37 mm  Right -coronary:  38 mm  Left -coronary:  40 mm  Coronary Artery Height above Annulus:  Left Main:  12 mm  Right Coronary:  16 mm  Virtual Basal Annulus Measurements:  Maximum/Minimum Diameter:  28 x 23 mm  Perimeter:  97 mm  Area:  521 mm2  Optimum Fluoroscopic Angle for Delivery:  RAO 2 CRA 2  IMPRESSION: 1. Severely thickened and calcified aortic valve with severely restricted leaflet opening and annular measurements suitable for deployment of 26 mm Edward-SAPIEN 3  valve.  2.  Sufficient annulus to coronary distance.  3.  Optimum Fluoroscopic Angle for Delivery:  RAO 2 CRA 2.  Ena Dawley   Electronically Signed   By: Ena Dawley   On: 07/25/2015 19:05   07/25/2015   EXAM: OVER-READ INTERPRETATION  CT CHEST  The following report is an over-read performed by radiologist Dr. Fonnie Birkenhead [facility] on 07/25/2015. This over-read does not include interpretation of cardiac or coronary anatomy or pathology. The coronary calcium score/coronary CTA interpretation by the cardiologist is attached.  COMPARISON:  07/21/2015.  FINDINGS: Pulmonary arteries are rather enlarged. Low right paratracheal lymph nodes measure up to 11 mm, likely reactive. Visualized portions of the lungs show septal thickening with a focal area of new consolidation in the subpleural lingula. Bilateral effusions and compressive atelectasis in both lower lobes, incompletely imaged.  IMPRESSION: 1. Congestive heart failure. 2. New infectious or inflammatory consolidation in the lingula. 3. Pulmonary arterial hypertension.  Electronically Signed: By: Lorin Picket M.D. On: 07/25/2015 15:19   Dg Abd Portable 1v  07/26/2015   CLINICAL DATA:  Nausea  EXAM: PORTABLE ABDOMEN - 1 VIEW  COMPARISON:  Abdominal CT from 5 days ago  FINDINGS: Small bilateral kidneys with high-density appearance from IV contrast yesterday. Contrast is seen in the urinary bladder, including large diverticulum. Nonobstructive bowel gas pattern. No concerning intra-abdominal mass effect. Cholecystectomy.  IMPRESSION: 1. Nonobstructive bowel gas pattern. 2. Large bladder diverticulum.   Electronically Signed   By: Monte Fantasia M.D.   On: 07/26/2015 08:51   Ct Angio Chest Aorta W/cm &/or Wo/cm  07/23/2015   CLINICAL DATA:  74 year old male with history of severe aortic stenosis. Preprocedural study prior to potential transcatheter aortic valve replacement (TAVR).  EXAM: CT ANGIOGRAPHY CHEST, ABDOMEN AND PELVIS   TECHNIQUE: Multidetector CT imaging through the chest, abdomen and pelvis was performed using the standard protocol during bolus administration of intravenous contrast. Multiplanar reconstructed images and MIPs were obtained and reviewed to evaluate the vascular anatomy.  CONTRAST:  173mL OMNIPAQUE IOHEXOL 350 MG/ML SOLN  COMPARISON:  No priors.  FINDINGS: CTA CHEST FINDINGS  Mediastinum/Lymph Nodes: Heart size is enlarged. There is no significant pericardial fluid, thickening or pericardial calcification. There is atherosclerosis of the thoracic aorta, the great vessels of the mediastinum and the coronary arteries, including calcified atherosclerotic plaque in the left main, left anterior descending, left circumflex and right coronary arteries. Severe thickening and calcification of the aortic valve. Calcifications of the mitral valve and mitral annulus. 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 Dilatation of the pulmonic trunk (4.8 cm in diameter), suggestive of pulmonary arterial hypertension. Multiple borderline enlarged mediastinal lymph nodes measuring up to 9 mm in short axis. No definite pathologically enlarged mediastinal or hilar lymph nodes. Esophagus is unremarkable in appearance. No axillary lymphadenopathy.  Lungs/Pleura: Moderate bilateral pleural effusions layering dependently with extensive passive atelectasis in the dependent portions of the lower lobes of the lungs bilaterally. Mild diffuse ground-glass attenuation and interlobular septal thickening in the lungs suggestive of a background of mild interstitial pulmonary edema. No acute consolidative airspace disease. No suspicious appearing pulmonary nodule or mass.  Musculoskeletal/Soft Tissues: There are no aggressive appearing lytic or blastic  lesions noted in the visualized portions of the skeleton.  CTA ABDOMEN AND PELVIS FINDINGS  Hepatobiliary: No cystic or  solid hepatic lesions. No intra or extrahepatic biliary ductal dilatation. Status post cholecystectomy.  Pancreas: No pancreatic mass. No pancreatic ductal dilatation. No pancreatic or peripancreatic fluid or inflammatory changes.  Spleen: Unremarkable.  Adrenals/Urinary Tract: 2.2 cm left adrenal nodule is indeterminate. Right adrenal gland is normal in appearance. Exophytic 3.5 cm simple cyst in the posterior aspect of the interpolar region of the left kidney. In the lateral interpolar region of the right kidney there is a 1.7 cm high attenuation (74 HU) lesion which could represent an enhancing lesion or a proteinaceous/hemorrhagic cyst. No hydroureteronephrosis. Urinary bladder is remarkable for a 5 cm diverticulum in the posterior aspect of the urinary bladder on the left side.  Stomach/Bowel: The appearance of the stomach is normal. Several diverticulae of the duodenum are noted. No surrounding inflammatory changes to suggest associated duodenal diverticulitis. No pathologic dilatation of small bowel or colon. Numerous colonic diverticulae are noted, without surrounding inflammatory changes to suggest an acute diverticulitis at this time. Normal appendix.  Vascular/Lymphatic: Vascular findings and measurements pertinent to potential TAVR procedure, as detailed below. No aneurysm or dissection identified. No lymphadenopathy noted in the abdomen or pelvis.  Reproductive: Prostate gland and seminal vesicles are unremarkable in appearance.  Other: Small bilateral inguinal hernias containing only fat incidentally noted. No significant volume of ascites. No pneumoperitoneum.  Musculoskeletal: Chronic appearing compression fracture of superior endplate of L1 with approximately 15% loss of anterior vertebral body height. There are no aggressive appearing lytic or blastic lesions noted in the visualized portions of the skeleton.  VASCULAR MEASUREMENTS PERTINENT TO TAVR:  AORTA:  Minimal Aortic Diameter -  16 x 16 mm   Severity of Aortic Calcification -  severe  RIGHT PELVIS:  Right Common Iliac Artery -  Minimal Diameter - 9.5 x 8.1 mm  Tortuosity - mild  Calcification - moderate  Right External Iliac Artery -  Minimal Diameter - 8.7 x 6.9 mm  Tortuosity - mild  Calcification - mild  Right Common Femoral Artery -  Minimal Diameter - Proximal 9.0 x 7.8 mm; Distal 8.6 x 3.6 mm  Tortuosity - mild  Calcification - moderate proximally, severe distally  LEFT PELVIS:  Left Common Iliac Artery -  Minimal Diameter - 11.6 x 10.0 mm  Tortuosity - mild  Calcification - moderate  Left External Iliac Artery -  Minimal Diameter - 8.8 x 7.8 mm  Tortuosity - mild  Calcification - mild  Left Common Femoral Artery -  Minimal Diameter - Proximal 7.0 x 6.1 mm; Distal 6.5 x 5.8 mm  Tortuosity - mild  Calcification - moderate proximally, severe distally  Review of the MIP images confirms the above findings.  IMPRESSION: 1. Vascular findings and measurements pertinent to potential TAVR procedure, as detailed above. This patient does appear to have suitable pelvic arterial access bilaterally, particularly if vascular access is achieved in the proximal common femoral arteries (distal common femoral arteries are not suitable for vascular access). 2. Severe thickening calcifications of the aortic valve, compatible with the reported clinical history of aortic stenosis. 3. Severe dilatation of the pulmonic trunk and main pulmonary arteries, suggestive of pulmonary arterial hypertension. 4. Cardiomegaly with evidence of mild interstitial pulmonary edema and moderate bilateral pleural effusions, suggesting congestive heart failure. Numerous borderline enlarged mediastinal lymph nodes are likely secondary to underlying congestive heart failure. 5. 2.2 cm left adrenal nodule is indeterminate. Statistically,  this is likely to represent a small adenoma, and could be further characterized with nonemergent noncontrast CT the abdomen. 6. 1.7 cm high attenuation lesion  in the interpolar region of the right kidney may represent either a proteinaceous/hemorrhagic cyst or a solid enhancing lesion. Ideally, this would be characterized by MRI, however, given the patient's pacemaker, further evaluation with nonemergent renal ultrasound is strongly recommended at this time. Alternatively, if this lesion is of similar high attenuation on the followup noncontrast CT the abdomen (recommended for number 6 above), no further characterization may be needed. 7. Additional incidental findings, as above.   Electronically Signed   By: Vinnie Langton M.D.   On: 07/23/2015 16:01   Ct Angio Abd/pel W/ And/or W/o  07/23/2015   CLINICAL DATA:  74 year old male with history of severe aortic stenosis. Preprocedural study prior to potential transcatheter aortic valve replacement (TAVR).  EXAM: CT ANGIOGRAPHY CHEST, ABDOMEN AND PELVIS  TECHNIQUE: Multidetector CT imaging through the chest, abdomen and pelvis was performed using the standard protocol during bolus administration of intravenous contrast. Multiplanar reconstructed images and MIPs were obtained and reviewed to evaluate the vascular anatomy.  CONTRAST:  153mL OMNIPAQUE IOHEXOL 350 MG/ML SOLN  COMPARISON:  No priors.  FINDINGS: CTA CHEST FINDINGS  Mediastinum/Lymph Nodes: Heart size is enlarged. There is no significant pericardial fluid, thickening or pericardial calcification. There is atherosclerosis of the thoracic aorta, the great vessels of the mediastinum and the coronary arteries, including calcified atherosclerotic plaque in the left main, left anterior descending, left circumflex and right coronary arteries. Severe thickening and calcification of the aortic valve. Calcifications of the mitral valve and mitral annulus. 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 Dilatation of the pulmonic trunk (4.8 cm in diameter), suggestive of pulmonary arterial  hypertension. Multiple borderline enlarged mediastinal lymph nodes measuring up to 9 mm in short axis. No definite pathologically enlarged mediastinal or hilar lymph nodes. Esophagus is unremarkable in appearance. No axillary lymphadenopathy.  Lungs/Pleura: Moderate bilateral pleural effusions layering dependently with extensive passive atelectasis in the dependent portions of the lower lobes of the lungs bilaterally. Mild diffuse ground-glass attenuation and interlobular septal thickening in the lungs suggestive of a background of mild interstitial pulmonary edema. No acute consolidative airspace disease. No suspicious appearing pulmonary nodule or mass.  Musculoskeletal/Soft Tissues: There are no aggressive appearing lytic or blastic lesions noted in the visualized portions of the skeleton.  CTA ABDOMEN AND PELVIS FINDINGS  Hepatobiliary: No cystic or solid hepatic lesions. No intra or extrahepatic biliary ductal dilatation. Status post cholecystectomy.  Pancreas: No pancreatic mass. No pancreatic ductal dilatation. No pancreatic or peripancreatic fluid or inflammatory changes.  Spleen: Unremarkable.  Adrenals/Urinary Tract: 2.2 cm left adrenal nodule is indeterminate. Right adrenal gland is normal in appearance. Exophytic 3.5 cm simple cyst in the posterior aspect of the interpolar region of the left kidney. In the lateral interpolar region of the right kidney there is a 1.7 cm high attenuation (74 HU) lesion which could represent an enhancing lesion or a proteinaceous/hemorrhagic cyst. No hydroureteronephrosis. Urinary bladder is remarkable for a 5 cm diverticulum in the posterior aspect of the urinary bladder on the left side.  Stomach/Bowel: The appearance of the stomach is normal. Several diverticulae of the duodenum are noted. No surrounding inflammatory changes to suggest associated duodenal diverticulitis. No pathologic dilatation of small bowel or colon. Numerous colonic diverticulae are noted, without  surrounding inflammatory changes to suggest an acute diverticulitis  at this time. Normal appendix.  Vascular/Lymphatic: Vascular findings and measurements pertinent to potential TAVR procedure, as detailed below. No aneurysm or dissection identified. No lymphadenopathy noted in the abdomen or pelvis.  Reproductive: Prostate gland and seminal vesicles are unremarkable in appearance.  Other: Small bilateral inguinal hernias containing only fat incidentally noted. No significant volume of ascites. No pneumoperitoneum.  Musculoskeletal: Chronic appearing compression fracture of superior endplate of L1 with approximately 15% loss of anterior vertebral body height. There are no aggressive appearing lytic or blastic lesions noted in the visualized portions of the skeleton.  VASCULAR MEASUREMENTS PERTINENT TO TAVR:  AORTA:  Minimal Aortic Diameter -  16 x 16 mm  Severity of Aortic Calcification -  severe  RIGHT PELVIS:  Right Common Iliac Artery -  Minimal Diameter - 9.5 x 8.1 mm  Tortuosity - mild  Calcification - moderate  Right External Iliac Artery -  Minimal Diameter - 8.7 x 6.9 mm  Tortuosity - mild  Calcification - mild  Right Common Femoral Artery -  Minimal Diameter - Proximal 9.0 x 7.8 mm; Distal 8.6 x 3.6 mm  Tortuosity - mild  Calcification - moderate proximally, severe distally  LEFT PELVIS:  Left Common Iliac Artery -  Minimal Diameter - 11.6 x 10.0 mm  Tortuosity - mild  Calcification - moderate  Left External Iliac Artery -  Minimal Diameter - 8.8 x 7.8 mm  Tortuosity - mild  Calcification - mild  Left Common Femoral Artery -  Minimal Diameter - Proximal 7.0 x 6.1 mm; Distal 6.5 x 5.8 mm  Tortuosity - mild  Calcification - moderate proximally, severe distally  Review of the MIP images confirms the above findings.  IMPRESSION: 1. Vascular findings and measurements pertinent to potential TAVR procedure, as detailed above. This patient does appear to have suitable pelvic arterial access bilaterally,  particularly if vascular access is achieved in the proximal common femoral arteries (distal common femoral arteries are not suitable for vascular access). 2. Severe thickening calcifications of the aortic valve, compatible with the reported clinical history of aortic stenosis. 3. Severe dilatation of the pulmonic trunk and main pulmonary arteries, suggestive of pulmonary arterial hypertension. 4. Cardiomegaly with evidence of mild interstitial pulmonary edema and moderate bilateral pleural effusions, suggesting congestive heart failure. Numerous borderline enlarged mediastinal lymph nodes are likely secondary to underlying congestive heart failure. 5. 2.2 cm left adrenal nodule is indeterminate. Statistically, this is likely to represent a small adenoma, and could be further characterized with nonemergent noncontrast CT the abdomen. 6. 1.7 cm high attenuation lesion in the interpolar region of the right kidney may represent either a proteinaceous/hemorrhagic cyst or a solid enhancing lesion. Ideally, this would be characterized by MRI, however, given the patient's pacemaker, further evaluation with nonemergent renal ultrasound is strongly recommended at this time. Alternatively, if this lesion is of similar high attenuation on the followup noncontrast CT the abdomen (recommended for number 6 above), no further characterization may be needed. 7. Additional incidental findings, as above.   Electronically Signed   By: Vinnie Langton M.D.   On: 07/23/2015 16:01         Subjective: Patient denies fevers, chills, headache, chest pain, dyspnea, nausea, vomiting, diarrhea, abdominal pain, dysuria, hematuria   Objective: Filed Vitals:   07/27/15 1119 07/27/15 1350 07/27/15 2026 07/28/15 0628  BP: 111/51 100/48 94/39 98/42   Pulse: 90 88 82 83  Temp: 97.6 F (36.4 C) 97.6 F (36.4 C) 98.1 F (36.7 C) 98 F (36.7 C)  TempSrc: Oral Oral Oral Oral  Resp: 16 16 16 16   Height:      Weight:    93.169 kg  (205 lb 6.4 oz)  SpO2: 98% 94% 100% 98%    Intake/Output Summary (Last 24 hours) at 07/28/15 1039 Last data filed at 07/28/15 0806  Gross per 24 hour  Intake    660 ml  Output      0 ml  Net    660 ml   Weight change: 2.1 kg (4 lb 10.1 oz) Exam:   General:  Pt is alert, follows commands appropriately, not in acute distress  HEENT: No icterus, No thrush, No neck mass, [place]/AT  Cardiovascular: RRR, S1/S2, no rubs, no gallops  Respiratory: Fine bibasilar crackles. No wheezing. Good air movement.  Abdomen: Soft/+BS, non tender, non distended, no guarding; no hepatosplenomegaly  Extremities: No edema, No lymphangitis, No petechiae, No rashes, no synovitis; no cyanosis or clubbing   Data Reviewed: Basic Metabolic Panel:  Recent Labs Lab 07/22/15 0820 07/23/15 0321 07/25/15 1948 07/27/15 0345 07/28/15 0600  NA 134* 134* 132* 132* 135  K 4.0 4.1 5.4* 7.2* 5.2*  CL 95* 95* 92* 90* 95*  CO2 28 28 23  16* 24  GLUCOSE 154* 149* 176* 172* 117*  BUN 33* 25* 64* 84* 58*  CREATININE 4.23* 3.96* 9.52* 11.84* 8.81*  CALCIUM 8.6* 8.5* 9.3 9.2 8.7*  PHOS  --   --  6.8*  --   --    Liver Function Tests:  Recent Labs Lab 07/25/15 1948 07/27/15 0345 07/28/15 0600  AST  --  2886* 2549*  ALT  --  1562* 2332*  ALKPHOS  --  113 126  BILITOT  --  1.6* 1.3*  PROT  --  6.9 6.2*  ALBUMIN 3.3* 3.2* 3.0*    Recent Labs Lab 07/27/15 0841  LIPASE 27   No results for input(s): AMMONIA in the last 168 hours. CBC:  Recent Labs Lab 07/22/15 0820 07/23/15 0321 07/27/15 0345 07/28/15 0600  WBC 10.6* 11.1* 13.3* 11.5*  HGB 8.4* 8.5* 8.2* 7.8*  HCT 25.3* 26.0* 25.8* 24.1*  MCV 93.7 94.2 95.6 96.8  PLT 345 357 358 258   Cardiac Enzymes: No results for input(s): CKTOTAL, CKMB, CKMBINDEX, TROPONINI in the last 168 hours. BNP: Invalid input(s): POCBNP CBG:  Recent Labs Lab 07/26/15 2214 07/27/15 0542 07/27/15 1258 07/27/15 1653 07/27/15 2131  GLUCAP 164* 176* 110* 114*  116*    Recent Results (from the past 240 hour(s))  Culture, blood (routine x 2)     Status: None (Preliminary result)   Collection Time: 07/27/15  8:30 AM  Result Value Ref Range Status   Specimen Description BLOOD  Final   Special Requests   Final    BOTTLES DRAWN AEROBIC AND ANAEROBIC L AVF RED PORT 10ML   Culture PENDING  Incomplete   Report Status PENDING  Incomplete  Culture, blood (routine x 2)     Status: None (Preliminary result)   Collection Time: 07/27/15  8:45 AM  Result Value Ref Range Status   Specimen Description BLOOD  Final   Special Requests   Final    BOTTLES DRAWN AEROBIC AND ANAEROBIC L AVF REDPORT 10 ML   Culture PENDING  Incomplete   Report Status PENDING  Incomplete     Scheduled Meds: . antiseptic oral rinse  7 mL Mouth Rinse BID  . aspirin  325 mg Oral Daily  . calcium carbonate  3 tablet Oral TID WC  . clopidogrel  75 mg Oral Daily  . darbepoetin (ARANESP) injection - DIALYSIS  100 mcg Intravenous Q Mon-HD  . ferric gluconate (FERRLECIT/NULECIT) IV  62.5 mg Intravenous Q Fri-HD  . heparin  5,000 Units Subcutaneous 3 times per day  . insulin aspart  0-9 Units Subcutaneous TID WC  . metoprolol tartrate  12.5 mg Oral BID  . midodrine  10 mg Oral TID WC  . multivitamin  1 tablet Oral QHS   Continuous Infusions:    Jayce Boyko, DO  [facility] Pager [phone]  If 7PM-7AM, please contact night-coverage [email or web address] Password [email or web address] 07/28/2015, 10:39 AM   LOS: 10 days

## 2015-07-28 NOTE — Consult Note (Signed)
[facility] Consult: 12:15 PM 07/28/2015  LOS: 10 days    Referring Provider: Dr. Carles Collet Primary Care Physician:  Thurman Coyer, MD Primary Gastroenterologist:  Althia Forts     Reason for Consultation:  Transaminitis.   HPI: Jeffrey [name] is a 74 y.o. male.  Hx severe aortic stenosis, CAD with chronic RCA occlusion., diastolic HF,  PAF. S/p pacemaker/ICD for heart block  07/13/2015.  Peripheral vascular disease, previous stroke.  DM2.  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  In 2013 he had a food impaction which passed spontaneously. Says he had a colonoscopy "25 years ago" with a polyp removed.  Not interested in another colonoscopy.   For past few weeks he's had progressive dyspnea on exertion, lower extremity edema, weakness and anorexia.  Admitted with substernal chest pain 10 days ago. Echocardiogram shows severe AS. Cardiac cath 07/22/15 shows chronic mid RCA occlusion with left to right collaterals to the distal vessel, 50% LAD disease. After consulting with Dr. Mohammed Kindle and his cardiology team, it was decided that TAVR is the safest, best option and it is scheduled for 08/02/15. He has been undergoing routine HD as well as dialysis for fluid removal.  Borderline blood pressure has required midodrine for support.  LFTs were not obtained at the time of his initial admission. Previous LFTs last obtained in 12/2014 when they were normal. First set of LFTs on 07/27/15: T bili 1.6, AST/ALT 2886/1562.  Alkaline phosphatase normal. Today AST/ALT  2549/2332. T bili 1.3.  Hep B surface Ab and core Ab negative in 12/2014. Hep C Ab <0.1 on 07/27/15.  CT angio abd/pelvis 8/25: Normal diameter biliary ducts, s/p cholecystectomy. Duodenal and colonic tics, small fat-containing inguinal hernias. L1 compression fracture, adrenal nodule, right  kidney lesion (likely cyst). Ultrasound abdomen 8/31: limited study but no hepatobiliary abnormalities.  Maximum pro time was 17.6 and INR of 1.4 on 07/11/15.  these have not been checked since 8/26 when they were 16.2/1.2.  Review of MAR in Epic will not allow me to review meds given around those dates, thus unable to tell whether or not he was receiving blood thinners. Plavix as well as 3 times a day subcutaneous heparin are new meds, this admission. He is not on PPI now or at home.   Pt's appetite has declined and he developed nausea since admission.  Non-bloody emesis once a couple of days ago.  Daily, brown BMs.  No dysphagia.  Patient received ferric gluconate infusion on 8/26. Aranesp was last given on 07/26/15.  He recalls blood transfusion 30 or so years ago at time of ruptured GB and open cholecystectomy.   Past Medical History  Diagnosis Date  . Hyperlipidemia   . Hypertension   . Aortic stenosis     a. severe by echo 09/2014  . Atrial fibrillation     a. not well documented, not on anticoagulation  . Coronary artery disease     a. chronically occluded RCA per cath 09/2014 with collaterals  . Peripheral vascular disease   . Claustrophobia   . Complete heart block   .  Presence of permanent cardiac pacemaker   . Anginal pain   . Myocardial infarction 10/2014  . Pneumonia   . Type II diabetes mellitus   . Iron deficiency anemia   . History of blood transfusion     "related to gallbladder OR"  . History of stomach ulcers   . CVA (cerebral vascular accident) 10/2014    denies residual on 07/11/2015  . ESRD (end stage renal disease) on dialysis     a. on dialysis; [facility]; MWF, LUE fistula (07/11/2015)  . CHF (congestive heart failure)   . Renal insufficiency     Past Surgical History  Procedure Laterality Date  . Esophagogastroduodenoscopy  08/01/2012    Procedure: ESOPHAGOGASTRODUODENOSCOPY (EGD);  Surgeon: [name], MD;  Location: [facility];  Service:  Endoscopy;  Laterality: N/A;  . Tonsillectomy    . Colonoscopy w/ biopsies and polypectomy    . Av fistula placement Left 10/19/2014    Procedure: BRACHIOCEPHALIC ARTERIOVENOUS (AV) FISTULA CREATION ;  Surgeon: Conrad Hatfield, MD;  Location: [place];  Service: Vascular;  Laterality: Left;  . Left and right heart catheterization with coronary angiogram N/A 09/30/2014    Procedure: LEFT AND RIGHT HEART CATHETERIZATION WITH CORONARY ANGIOGRAM;  Surgeon: [name], MD;  Location: [facility] CATH LAB;  Service: Cardiovascular;  Laterality: N/A;  . Insertion of dialysis catheter Right 02/02/2015    Procedure: INSERTION OF DIALYSIS CATHETER  RIGHT INTERNAL JUGULAR;  Surgeon: Mal [name], MD;  Location: [place];  Service: Vascular;  Laterality: Right;  . Cardiac catheterization    . Insert / replace / remove pacemaker  07/11/2015  . Cholecystectomy open  1980's  . Cataract extraction w/ intraocular lens  implant, bilateral Bilateral 1990's  . Coronary angioplasty    . Ep implantable device N/A 07/11/2015    Procedure: Pacemaker Implant;  Surgeon: [name], MD;  Location: [place] CV LAB;  Service: Cardiovascular;  Laterality: N/A;  . Cardiac catheterization N/A 07/22/2015    Procedure: Right/Left Heart Cath and Coronary Angiography;  Surgeon: [name], MD;  Location: [place] CV LAB;  Service: Cardiovascular;  Laterality: N/A;    Prior to Admission medications   Medication Sig Start Date End Date Taking? Authorizing Provider  amLODipine (NORVASC) 10 MG tablet Take 10 mg by mouth daily.     Yes Historical Provider, MD  aspirin 81 MG chewable tablet Chew 81 mg by mouth daily.   Yes Historical Provider, MD  calcium carbonate (TUMS - DOSED IN MG ELEMENTAL CALCIUM) 500 MG chewable tablet Chew 4 tablets by mouth 3 (three) times daily before meals.    Yes Historical Provider, MD  hydrALAZINE (APRESOLINE) 25 MG tablet Take 25 mg by mouth 2 (two) times daily. 05/03/15  Yes Historical  Provider, MD  Insulin Detemir (LEVEMIR FLEXTOUCH) 100 UNIT/ML Pen Inject 35 Units into the skin at bedtime. 05/03/15  Yes Historical Provider, MD  lidocaine-prilocaine (EMLA) cream Apply 1 application topically 3 (three) times a week. Apply 1 application onto the left arm as directed 06/21/15  Yes Historical Provider, MD  multivitamin (RENA-VIT) TABS tablet Take 1 tablet by mouth daily.   Yes Historical Provider, MD  nitroGLYCERIN (NITROSTAT) 0.4 MG SL tablet Place 0.4 mg under the tongue every 5 (five) minutes as needed for chest pain.  10/16/13  Yes Historical Provider, MD  simvastatin (ZOCOR) 20 MG tablet Take 1 tablet (20 mg total) by mouth at bedtime. 10/02/14  Yes Theodis Blaze, MD  Scheduled Meds: . antiseptic oral rinse  7 mL Mouth Rinse BID  . aspirin  325 mg Oral Daily  . calcium carbonate  3 tablet Oral TID WC  . clopidogrel  75 mg Oral Daily  . darbepoetin (ARANESP) injection - DIALYSIS  100 mcg Intravenous Q Mon-HD  . ferric gluconate (FERRLECIT/NULECIT) IV  62.5 mg Intravenous Q Fri-HD  . heparin  5,000 Units Subcutaneous 3 times per day  . insulin aspart  0-9 Units Subcutaneous TID WC  . metoprolol tartrate  12.5 mg Oral BID  . midodrine  10 mg Oral TID WC  . multivitamin  1 tablet Oral QHS   Infusions:   PRN Meds: acetaminophen **OR** [DISCONTINUED] acetaminophen, albuterol, HYDROcodone-acetaminophen, HYDROcodone-homatropine, metoprolol, nitroGLYCERIN, [DISCONTINUED] ondansetron **OR** ondansetron (ZOFRAN) IV   Allergies as of 07/18/2015 - Review Complete 07/18/2015  Allergen Reaction Noted  . [place] 10 mcg pen [exenatide] Diarrhea and Nausea And Vomiting 07/31/2012    Family History  Problem Relation Age of Onset  . Diabetes Father   . Heart disease Father   . Diabetes Sister     Social History   Social History  . Marital Status: Married    Spouse Name: N/A  . Number of Children: N/A  . Years of Education: N/A   Occupational History  . Not on file.    Social History Main Topics  . Smoking status: Former Smoker -- 2.00 packs/day for 32 years    Quit date: 11/27/1983  . Smokeless tobacco: Never Used  . Alcohol Use: Yes     Comment: 07/11/2015 "stopped drinking in 09/2014"  . Drug Use: No  . Sexual Activity: Not on file   Other Topics Concern  . Not on file   Social History Narrative    REVIEW OF SYSTEMS: Constitutional:  Weakness, no falls ENT:  No nose bleeds Pulm:  + SOB/DOE: improved CV:  No chest pain, improving edema.  GU:  Oliguric, no blood in urine.  GI:  Per HPI Heme:  Per HPI   Transfusions:  Per HPI Neuro:  No headaches, no peripheral tingling or numbness Derm:  No itching, no rash or sores.  Endocrine:  No sweats or chills.  No polyuria or dysuria Immunization:  Not queried.  Travel:  None beyond local counties in last few months.    PHYSICAL EXAM: Vital signs in last 24 hours: Filed Vitals:   07/28/15 1040  BP: 112/67  Pulse: 81  Temp: 98.0  Resp: 16   Wt Readings from Last 3 Encounters:  07/28/15 205 lb 6.4 oz (93.169 kg)  07/12/15 219 lb 2.2 oz (99.4 kg)  07/07/15 221 lb 12.8 oz (100.608 kg)    General: pleasant, ill-appearing.  Looks tired and moderately frail.  Head:  No swelling or asymmetry  Eyes:  No icterus or pallor Ears:  Slightly HOH  Nose:  No congestion or discharge Mouth:  Clear, moist oral MM Neck:  No mass, bruits or JVD Lungs:  Diminished but clear.  No resting dyspnea, no cough Heart: RRR, 3/6systolic murmer.  Pacer/ICD on right upper chest.  Abdomen:  Soft, protuberant, NT.  Active BS.  Incisional hernia in RUQ above chole scar.  No tenderness or masses.  No HSM.   Rectal: deferred   Musc/Skeltl: no joint swelling, redness or deformity Extremities:  No CCE.    Neurologic:  Oriented x 3. Alert. No tremor.  Moves all 4 limbs, strength not tested. Skin:  Prominent, blanchable small vessels in upper left chest. Tattoos:  None seen Nodes:  No cervical adenopathy   Psych:   Pleasant, good spirits, engaged.   Intake/Output from previous day: 08/31 0701 - 09/01 0700 In: 540 [P.O.:540] Out: 0  Intake/Output this shift: Total I/O In: 240 [P.O.:240] Out: 0   LAB RESULTS:  Recent Labs  07/27/15 0345 07/28/15 0600  WBC 13.3* 11.5*  HGB 8.2* 7.8*  HCT 25.8* 24.1*  PLT 358 258   BMET Lab Results  Component Value Date   NA 135 07/28/2015   NA 132* 07/27/2015   NA 132* 07/25/2015   K 5.2* 07/28/2015   K 7.2* 07/27/2015   K 5.4* 07/25/2015   CL 95* 07/28/2015   CL 90* 07/27/2015   CL 92* 07/25/2015   CO2 24 07/28/2015   CO2 16* 07/27/2015   CO2 23 07/25/2015   GLUCOSE 117* 07/28/2015   GLUCOSE 172* 07/27/2015   GLUCOSE 176* 07/25/2015   BUN 58* 07/28/2015   BUN 84* 07/27/2015   BUN 64* 07/25/2015   CREATININE 8.81* 07/28/2015   CREATININE 11.84* 07/27/2015   CREATININE 9.52* 07/25/2015   CALCIUM 8.7* 07/28/2015   CALCIUM 9.2 07/27/2015   CALCIUM 9.3 07/25/2015   LFT  Recent Labs  07/25/15 1948 07/27/15 0345 07/28/15 0600  PROT  --  6.9 6.2*  ALBUMIN 3.3* 3.2* 3.0*  AST  --  2886* 2549*  ALT  --  1562* 2332*  ALKPHOS  --  113 126  BILITOT  --  1.6* 1.3*   PT/INR Lab Results  Component Value Date   INR 1.28 07/22/2015   INR 1.44 07/11/2015   INR 1.19 04/27/2015   Hepatitis Panel  Recent Labs  07/27/15 0841  HCVAB <0.1   C-Diff No components found for: CDIFF Lipase     Component Value Date/Time   LIPASE 27 07/27/2015 0841    Drugs of Abuse     Component Value Date/Time   LABOPIA NONE DETECTED 09/29/2014 0720   COCAINSCRNUR NONE DETECTED 09/29/2014 0720   LABBENZ NONE DETECTED 09/29/2014 0720   AMPHETMU NONE DETECTED 09/29/2014 0720   THCU NONE DETECTED 09/29/2014 0720   LABBARB NONE DETECTED 09/29/2014 0720     RADIOLOGY STUDIES: Dg Chest 2 View  07/27/2015   CLINICAL DATA:  Pneumonia, CHF, chest pain and shortness of breath, but not today  EXAM: CHEST  2 VIEW  COMPARISON:  07/25/2015  FINDINGS:  Bilateral interstitial thickening with prominence of the central pulmonary vasculature bilateral small pleural effusions. No pneumothorax. Stable cardiomegaly. Thoracic aortic atherosclerosis. No acute osseous abnormality. Dual lead cardiac pacemaker.  IMPRESSION: Mild CHF.   Electronically Signed   By: Kathreen Devoid   On: 07/27/2015 17:36   US Abdomen Complete  07/27/2015   CLINICAL DATA:  Mid abdominal pain, elevated LFTs  EXAM: ULTRASOUND ABDOMEN COMPLETE  COMPARISON:  07/21/2015  FINDINGS: Gallbladder: Surgically absent  Common bile duct: Diameter: 5.5 mm  Liver: No focal lesion identified. Within normal limits in parenchymal echogenicity.  IVC: No abnormality visualized.  Pancreas: Obscured by bowel gas  Spleen: Size and appearance within normal limits.  Right Kidney: Length: 9.7 cm.  9.6 cm  Left Kidney: Length: 3 cm simple cyst lower pole. Echogenicity within normal limits. No mass or hydronephrosis visualized.  Abdominal aorta: Largely obscured by bowel gas  Other findings: Bilateral pleural effusions  IMPRESSION: Study is somewhat limited. No acute findings identified other than bilateral pleural effusions.   Electronically Signed   By: Skipper Cliche M.D.   On: 07/27/2015 12:42  ENDOSCOPIC STUDIES: Remote colonoscopy, no records exist  IMPRESSION:   *  Transaminitis.  Likely due to passive hepatic congestion from heart failure due to severe AS.    *  Severe AS and CAD.  TAVR planned for 08/02/15   PLAN:     *  Check coags in AM to assess for any associated, acute synthetic liver dysfunction. Recheck Hep B acute serologies. (negative 12/2013)   Azucena Freed  07/28/2015, 12:15 PM Pager: [phone]      Attending physician's note   I have taken a history, examined the patient and reviewed the chart. I agree with the Advanced Practitioner's note, impression and recommendations. Elevated transaminases very likely due to passive hepatic congestion due to heart failure from severe AS. R/O  acute hepatitis A & B however this is unlikely. Hep C Ab was negative. Abd [place] negative for biliary and hepatic pathology. Trend LFTs and check PT/INR. Consider PRBC tranfusion which could help with HF-defer to primary service.   Ladene Artist, MD [facility] [phone] pager Mon-Fri 8a-5p [phone] weekends, holidays and 5p-8a or per [facility]

## 2015-07-28 NOTE — Progress Notes (Signed)
[facility] ROUNDING NOTE   Subjective:   Interval History: appears much better  Objective:  Vital signs in last 24 hours:  Temp:  [97.6 F (36.4 C)-98.1 F (36.7 C)] 98 F (36.7 C) (09/01 0628) Pulse Rate:  [81-88] 81 (09/01 1040) Resp:  [16] 16 (09/01 0628) BP: (94-112)/(39-67) 112/67 mmHg (09/01 1040) SpO2:  [94 %-100 %] 98 % (09/01 0628) Weight:  [93.169 kg (205 lb 6.4 oz)] 93.169 kg (205 lb 6.4 oz) (09/01 0628)  Weight change: 2.1 kg (4 lb 10.1 oz) Filed Weights   07/27/15 0526 07/27/15 0745 07/28/15 0628  Weight: 91.7 kg (202 lb 2.6 oz) 93.8 kg (206 lb 12.7 oz) 93.169 kg (205 lb 6.4 oz)    Intake/Output: I/O last 3 completed shifts: In: 540 [P.O.:540] Out: 0    Intake/Output this shift:  Total I/O In: 240 [P.O.:240] Out: 0   CVS- RRR RS- CTA ABD- BS present soft non-distended EXT- no edema   Basic Metabolic Panel:  Recent Labs Lab 07/22/15 0820 07/23/15 0321 07/25/15 1948 07/27/15 0345 07/28/15 0600  NA 134* 134* 132* 132* 135  K 4.0 4.1 5.4* 7.2* 5.2*  CL 95* 95* 92* 90* 95*  CO2 28 28 23  16* 24  GLUCOSE 154* 149* 176* 172* 117*  BUN 33* 25* 64* 84* 58*  CREATININE 4.23* 3.96* 9.52* 11.84* 8.81*  CALCIUM 8.6* 8.5* 9.3 9.2 8.7*  PHOS  --   --  6.8*  --   --     Liver Function Tests:  Recent Labs Lab 07/25/15 1948 07/27/15 0345 07/28/15 0600  AST  --  2886* 2549*  ALT  --  1562* 2332*  ALKPHOS  --  113 126  BILITOT  --  1.6* 1.3*  PROT  --  6.9 6.2*  ALBUMIN 3.3* 3.2* 3.0*    Recent Labs Lab 07/27/15 0841  LIPASE 27   No results for input(s): AMMONIA in the last 168 hours.  CBC:  Recent Labs Lab 07/22/15 0820 07/23/15 0321 07/27/15 0345 07/28/15 0600  WBC 10.6* 11.1* 13.3* 11.5*  HGB 8.4* 8.5* 8.2* 7.8*  HCT 25.3* 26.0* 25.8* 24.1*  MCV 93.7 94.2 95.6 96.8  PLT 345 357 358 258    Cardiac Enzymes: No results for input(s): CKTOTAL, CKMB, CKMBINDEX, TROPONINI in the last 168 hours.  BNP: Invalid  input(s): POCBNP  CBG:  Recent Labs Lab 07/27/15 1258 07/27/15 1653 07/27/15 2131 07/28/15 0753 07/28/15 1139  GLUCAP 110* 114* 116* 113* 152*    Microbiology: Results for orders placed or performed during the hospital encounter of 07/18/15  Culture, blood (routine x 2)     Status: None (Preliminary result)   Collection Time: 07/27/15  8:30 AM  Result Value Ref Range Status   Specimen Description BLOOD  Final   Special Requests   Final    BOTTLES DRAWN AEROBIC AND ANAEROBIC L AVF RED PORT 10ML   Culture NO GROWTH 1 DAY  Final   Report Status PENDING  Incomplete  Culture, blood (routine x 2)     Status: None (Preliminary result)   Collection Time: 07/27/15  8:45 AM  Result Value Ref Range Status   Specimen Description BLOOD  Final   Special Requests   Final    BOTTLES DRAWN AEROBIC AND ANAEROBIC L AVF REDPORT 10 ML   Culture NO GROWTH 1 DAY  Final   Report Status PENDING  Incomplete    Coagulation Studies: No results for input(s): LABPROT, INR in the last 72 hours.  Urinalysis: No results for input(s): COLORURINE, LABSPEC, PHURINE, GLUCOSEU, HGBUR, BILIRUBINUR, KETONESUR, PROTEINUR, UROBILINOGEN, NITRITE, LEUKOCYTESUR in the last 72 hours.  Invalid input(s): APPERANCEUR    Imaging: Dg Chest 2 View  07/27/2015   CLINICAL DATA:  Pneumonia, CHF, chest pain and shortness of breath, but not today  EXAM: CHEST  2 VIEW  COMPARISON:  07/25/2015  FINDINGS: Bilateral interstitial thickening with prominence of the central pulmonary vasculature bilateral small pleural effusions. No pneumothorax. Stable cardiomegaly. Thoracic aortic atherosclerosis. No acute osseous abnormality. Dual lead cardiac pacemaker.  IMPRESSION: Mild CHF.   Electronically Signed   By: Kathreen Devoid   On: 07/27/2015 17:36   US Abdomen Complete  07/27/2015   CLINICAL DATA:  Mid abdominal pain, elevated LFTs  EXAM: ULTRASOUND ABDOMEN COMPLETE  COMPARISON:  07/21/2015  FINDINGS: Gallbladder: Surgically absent   Common bile duct: Diameter: 5.5 mm  Liver: No focal lesion identified. Within normal limits in parenchymal echogenicity.  IVC: No abnormality visualized.  Pancreas: Obscured by bowel gas  Spleen: Size and appearance within normal limits.  Right Kidney: Length: 9.7 cm.  9.6 cm  Left Kidney: Length: 3 cm simple cyst lower pole. Echogenicity within normal limits. No mass or hydronephrosis visualized.  Abdominal aorta: Largely obscured by bowel gas  Other findings: Bilateral pleural effusions  IMPRESSION: Study is somewhat limited. No acute findings identified other than bilateral pleural effusions.   Electronically Signed   By: Skipper Cliche M.D.   On: 07/27/2015 12:42     Medications:     . antiseptic oral rinse  7 mL Mouth Rinse BID  . aspirin  325 mg Oral Daily  . calcium carbonate  3 tablet Oral TID WC  . clopidogrel  75 mg Oral Daily  . darbepoetin (ARANESP) injection - DIALYSIS  100 mcg Intravenous Q Mon-HD  . ferric gluconate (FERRLECIT/NULECIT) IV  62.5 mg Intravenous Q Fri-HD  . heparin  5,000 Units Subcutaneous 3 times per day  . insulin aspart  0-9 Units Subcutaneous TID WC  . metoprolol tartrate  12.5 mg Oral BID  . midodrine  10 mg Oral TID WC  . 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

## 2015-07-29 DIAGNOSIS — I25110 Atherosclerotic heart disease of native coronary artery with unstable angina pectoris: Secondary | ICD-10-CM

## 2015-07-29 DIAGNOSIS — I350 Nonrheumatic aortic (valve) stenosis: Secondary | ICD-10-CM

## 2015-07-29 DIAGNOSIS — R7989 Other specified abnormal findings of blood chemistry: Secondary | ICD-10-CM

## 2015-07-29 LAB — COMPREHENSIVE METABOLIC PANEL
ALBUMIN: 3.2 g/dL — AB (ref 3.5–5.0)
ALK PHOS: 126 U/L (ref 38–126)
ALT: 2332 U/L — AB (ref 17–63)
ALT: 1813 U/L — ABNORMAL HIGH (ref 17–63)
ANION GAP: 18 — AB (ref 5–15)
AST: 2549 U/L — ABNORMAL HIGH (ref 15–41)
AST: 966 U/L — ABNORMAL HIGH (ref 15–41)
Albumin: 3 g/dL — ABNORMAL LOW (ref 3.5–5.0)
Alkaline Phosphatase: 147 U/L — ABNORMAL HIGH (ref 38–126)
Anion gap: 16 — ABNORMAL HIGH (ref 5–15)
BUN: 58 mg/dL — ABNORMAL HIGH (ref 6–20)
BUN: 80 mg/dL — ABNORMAL HIGH (ref 6–20)
CALCIUM: 8.7 mg/dL — AB (ref 8.9–10.3)
CALCIUM: 8.9 mg/dL (ref 8.9–10.3)
CO2: 24 mmol/L (ref 22–32)
CO2: 22 mmol/L (ref 22–32)
CREATININE: 8.81 mg/dL — AB (ref 0.61–1.24)
Chloride: 95 mmol/L — ABNORMAL LOW (ref 101–111)
Chloride: 92 mmol/L — ABNORMAL LOW (ref 101–111)
Creatinine, Ser: 10.21 mg/dL — ABNORMAL HIGH (ref 0.61–1.24)
GFR calc non Af Amer: 4 mL/min — ABNORMAL LOW (ref >60)
GFR, EST AFRICAN AMERICAN: 6 mL/min — AB (ref >60)
GFR, EST AFRICAN AMERICAN: 5 mL/min — AB (ref >60)
GFR, EST NON AFRICAN AMERICAN: 5 mL/min — AB (ref >60)
GLUCOSE: 168 mg/dL — AB (ref 65–99)
Glucose, Bld: 117 mg/dL — ABNORMAL HIGH (ref 65–99)
POTASSIUM: 6.2 mmol/L — AB (ref 3.5–5.1)
Potassium: 5.2 mmol/L — ABNORMAL HIGH (ref 3.5–5.1)
SODIUM: 132 mmol/L — AB (ref 135–145)
Sodium: 135 mmol/L (ref 135–145)
TOTAL PROTEIN: 6.7 g/dL (ref 6.5–8.1)
Total Bilirubin: 1.3 mg/dL — ABNORMAL HIGH (ref 0.3–1.2)
Total Bilirubin: 1.6 mg/dL — ABNORMAL HIGH (ref 0.3–1.2)
Total Protein: 6.2 g/dL — ABNORMAL LOW (ref 6.5–8.1)

## 2015-07-29 LAB — RENAL FUNCTION PANEL
Albumin: 3 g/dL — ABNORMAL LOW (ref 3.5–5.0)
Anion gap: 21 — ABNORMAL HIGH (ref 5–15)
BUN: 89 mg/dL — ABNORMAL HIGH (ref 6–20)
CO2: 19 mmol/L — ABNORMAL LOW (ref 22–32)
Calcium: 8.9 mg/dL (ref 8.9–10.3)
Chloride: 93 mmol/L — ABNORMAL LOW (ref 101–111)
Creatinine, Ser: 10.47 mg/dL — ABNORMAL HIGH (ref 0.61–1.24)
GFR calc Af Amer: 5 mL/min — ABNORMAL LOW (ref >60)
GFR calc non Af Amer: 4 mL/min — ABNORMAL LOW (ref >60)
Glucose, Bld: 168 mg/dL — ABNORMAL HIGH (ref 65–99)
Phosphorus: 9.8 mg/dL — ABNORMAL HIGH (ref 2.5–4.6)
Potassium: 5.4 mmol/L — ABNORMAL HIGH (ref 3.5–5.1)
Sodium: 133 mmol/L — ABNORMAL LOW (ref 135–145)

## 2015-07-29 LAB — ROCKY MTN SPOTTED FVR ABS PNL(IGG+IGM)
RMSF IGG: POSITIVE — AB
RMSF IgM: 0.31 index (ref 0.00–0.89)

## 2015-07-29 LAB — HEPATITIS A ANTIBODY, IGM: HEP A IGM: NEGATIVE

## 2015-07-29 LAB — CBC
HCT: 25.2 % — ABNORMAL LOW (ref 39.0–52.0)
Hemoglobin: 8.3 g/dL — ABNORMAL LOW (ref 13.0–17.0)
MCH: 31.8 pg (ref 26.0–34.0)
MCHC: 32.9 g/dL (ref 30.0–36.0)
MCV: 96.6 fL (ref 78.0–100.0)
Platelets: 229 10*3/uL (ref 150–400)
RBC: 2.61 MIL/uL — ABNORMAL LOW (ref 4.22–5.81)
RDW: 18.4 % — ABNORMAL HIGH (ref 11.5–15.5)
WBC: 11.8 10*3/uL — ABNORMAL HIGH (ref 4.0–10.5)

## 2015-07-29 LAB — GLUCOSE, CAPILLARY
GLUCOSE-CAPILLARY: 154 mg/dL — AB (ref 65–99)
Glucose-Capillary: 156 mg/dL — ABNORMAL HIGH (ref 65–99)
Glucose-Capillary: 165 mg/dL — ABNORMAL HIGH (ref 65–99)
Glucose-Capillary: 122 mg/dL — ABNORMAL HIGH (ref 65–99)

## 2015-07-29 LAB — RMSF, IGG, IFA

## 2015-07-29 LAB — PROTIME-INR
INR: 1.96 — ABNORMAL HIGH (ref 0.00–1.49)
PROTHROMBIN TIME: 22.3 s — AB (ref 11.6–15.2)

## 2015-07-29 MED ORDER — SODIUM CHLORIDE 0.9 % IV SOLN: 100.0000 mL | INTRAVENOUS | Status: DC | PRN

## 2015-07-29 MED ORDER — ONDANSETRON HCL 4 MG/2ML IJ SOLN
4.0000 mg | Freq: Three times a day (TID) | INTRAMUSCULAR | Status: AC
  Administered 2015-07-29 – 2015-07-30 (×4): 4 mg via INTRAVENOUS
  Filled 2015-07-29 (×4): qty 2

## 2015-07-29 MED ORDER — HEPARIN SODIUM (PORCINE) 1000 UNIT/ML DIALYSIS
1000.0000 [IU] | INTRAMUSCULAR | Status: DC | PRN
  Filled 2015-07-29: qty 1

## 2015-07-29 MED ORDER — SODIUM POLYSTYRENE SULFONATE 15 GM/60ML PO SUSP
15.0000 g | Freq: Once | ORAL | Status: AC
Start: 2015-07-29 — End: 2015-07-29
  Administered 2015-07-29: 15 g via ORAL
  Filled 2015-07-29: qty 60

## 2015-07-29 MED ORDER — LIDOCAINE-PRILOCAINE 2.5-2.5 % EX CREA
1.0000 "application " | TOPICAL_CREAM | CUTANEOUS | Status: DC | PRN
  Filled 2015-07-29: qty 5

## 2015-07-29 MED ORDER — PANTOPRAZOLE SODIUM 40 MG PO TBEC
40.0000 mg | DELAYED_RELEASE_TABLET | Freq: Every day | ORAL | Status: DC
  Administered 2015-07-29 – 2015-08-05 (×7): 40 mg via ORAL
  Filled 2015-07-29 (×7): qty 1

## 2015-07-29 MED ORDER — CALCIUM CARBONATE ANTACID 500 MG PO CHEW
1.0000 | CHEWABLE_TABLET | Freq: Three times a day (TID) | ORAL | Status: AC | PRN
  Administered 2015-07-29 – 2015-08-05 (×2): 200 mg via ORAL
  Filled 2015-07-29 (×4): qty 1

## 2015-07-29 MED ORDER — ALTEPLASE 2 MG IJ SOLR
2.0000 mg | Freq: Once | INTRAMUSCULAR | Status: DC | PRN
  Filled 2015-07-29: qty 2

## 2015-07-29 MED ORDER — LIDOCAINE HCL (PF) 1 % IJ SOLN: 5.0000 mL | INTRAMUSCULAR | Status: DC | PRN

## 2015-07-29 MED ORDER — SODIUM CHLORIDE 0.9 % IV SOLN
1.0000 g | Freq: Once | INTRAVENOUS | Status: AC
  Administered 2015-07-29: 1 g via INTRAVENOUS
  Filled 2015-07-29: qty 10

## 2015-07-29 MED ORDER — SODIUM POLYSTYRENE SULFONATE 15 GM/60ML PO SUSP: 45.0000 g | Freq: Once | ORAL | Status: DC

## 2015-07-29 MED ORDER — PHYTONADIONE 5 MG PO TABS
10.0000 mg | ORAL_TABLET | Freq: Once | ORAL | Status: AC
  Administered 2015-07-29: 10 mg via ORAL
  Filled 2015-07-29: qty 2

## 2015-07-29 MED ORDER — PENTAFLUOROPROP-TETRAFLUOROETH EX AERO: 1.0000 "application " | INHALATION_SPRAY | CUTANEOUS | Status: DC | PRN

## 2015-07-29 NOTE — Progress Notes (Signed)
Daily Rounding Note  07/29/2015, 8:37 AM  LOS: 11 days   SUBJECTIVE:       No abdominal pain.  Good sized BM yesterday.  Still c/o nausea but no emesis.   OBJECTIVE:         Vital signs in last 24 hours:    Temp:  [97.8 F (36.6 C)-98.3 F (36.8 C)] 98.3 F (36.8 C) (09/02 0426) Pulse Rate:  [70-86] 76 (09/02 0426) Resp:  [16-18] 18 (09/02 0426) BP: (85-112)/(34-68) 91/34 mmHg (09/02 0426) SpO2:  [96 %-99 %] 97 % (09/02 0426) Weight:  [207 lb 10.8 oz (94.2 kg)] 207 lb 10.8 oz (94.2 kg) (09/02 0500) Last BM Date: 07/27/15 Filed Weights   07/27/15 0745 07/28/15 0628 07/29/15 0500  Weight: 206 lb 12.7 oz (93.8 kg) 205 lb 6.4 oz (93.169 kg) 207 lb 10.8 oz (94.2 kg)   General: looks ill, tired, frail.  comfortable   Heart: RRR with 2-3/6 harsh syst murmer Chest: clear bil.  No cough or dyspnea at rest Abdomen: soft, hypoactive BS.  NT, ND, no HSM  Extremities: slight LE edema Neuro/Psych:  Pleasant, somewhat lethargic. Oriented x 3.    Intake/Output from previous day: 09/01 0701 - 09/02 0700 In: 770 [P.O.:660; IV Piggyback:110] Out: 0   Intake/Output this shift:    Lab Results:  Recent Labs  07/27/15 0345 07/28/15 0600  WBC 13.3* 11.5*  HGB 8.2* 7.8*  HCT 25.8* 24.1*  PLT 358 258   BMET  Recent Labs  07/27/15 0345 07/28/15 0600 07/29/15 0402  NA 132* 135 132*  K 7.2* 5.2* 6.2*  CL 90* 95* 92*  CO2 16* 24 22  GLUCOSE 172* 117* 168*  BUN 84* 58* 80*  CREATININE 11.84* 8.81* 10.21*  CALCIUM 9.2 8.7* 8.9   LFT  Recent Labs  07/27/15 0345 07/28/15 0600 07/29/15 0402  PROT 6.9 6.2* 6.7  ALBUMIN 3.2* 3.0* 3.2*  AST 2886* 2549* 966*  ALT 1562* 2332* 1813*  ALKPHOS 113 126 147*  BILITOT 1.6* 1.3* 1.6*   PT/INR  Recent Labs  07/29/15 0402  LABPROT 22.3*  INR 1.96*   Hepatitis Panel  Recent Labs  07/27/15 0841 07/28/15 1815  HCVAB <0.1  --   HEPAIGM  --  Negative     Studies/Results: Dg Chest 2 View  07/27/2015   CLINICAL DATA:  Pneumonia, CHF, chest pain and shortness of breath, but not today  EXAM: CHEST  2 VIEW  COMPARISON:  07/25/2015  FINDINGS: Bilateral interstitial thickening with prominence of the central pulmonary vasculature bilateral small pleural effusions. No pneumothorax. Stable cardiomegaly. Thoracic aortic atherosclerosis. No acute osseous abnormality. Dual lead cardiac pacemaker.  IMPRESSION: Mild CHF.   Electronically Signed   By: Kathreen Devoid   On: 07/27/2015 17:36   US Abdomen Complete  07/27/2015   CLINICAL DATA:  Mid abdominal pain, elevated LFTs  EXAM: ULTRASOUND ABDOMEN COMPLETE  COMPARISON:  07/21/2015  FINDINGS: Gallbladder: Surgically absent  Common bile duct: Diameter: 5.5 mm  Liver: No focal lesion identified. Within normal limits in parenchymal echogenicity.  IVC: No abnormality visualized.  Pancreas: Obscured by bowel gas  Spleen: Size and appearance within normal limits.  Right Kidney: Length: 9.7 cm.  9.6 cm  Left Kidney: Length: 3 cm simple cyst lower pole. Echogenicity within normal limits. No mass or hydronephrosis visualized.  Abdominal aorta: Largely obscured by bowel gas  Other findings: Bilateral pleural effusions  IMPRESSION: Study is somewhat limited. No acute  findings identified other than bilateral pleural effusions.   Electronically Signed   By: Skipper Cliche M.D.   On: 07/27/2015 12:42   Scheduled Meds: . antiseptic oral rinse  7 mL Mouth Rinse BID  . aspirin  325 mg Oral Daily  . calcium carbonate  3 tablet Oral TID WC  . darbepoetin (ARANESP) injection - DIALYSIS  100 mcg Intravenous Q Mon-HD  . ferric gluconate (FERRLECIT/NULECIT) IV  62.5 mg Intravenous Q Fri-HD  . heparin  5,000 Units Subcutaneous 3 times per day  . insulin aspart  0-9 Units Subcutaneous TID WC  . metoprolol tartrate  12.5 mg Oral BID  . midodrine  10 mg Oral TID WC  . multivitamin  1 tablet Oral QHS   Continuous Infusions:  PRN  Meds:.acetaminophen **OR** [DISCONTINUED] acetaminophen, albuterol, calcium carbonate, HYDROcodone-acetaminophen, HYDROcodone-homatropine, metoprolol, nitroGLYCERIN, [DISCONTINUED] ondansetron **OR** ondansetron (ZOFRAN) IV   ASSESMENT:   * Transaminitis. Likely due to passive hepatic congestion from heart failure due to severe AS: ie shock liver. Improving. Acute hepatitis A IgM negative. Awaiting hep B serologies (Hep A IgM, Hep B surface Ag, Hep B core Ab total, Hep Be ab)   *  Coagulopathy.  Only thinner is subq heparin.  This reflects element of hepatic synthetic dysfunction.   * Severe AS and CAD. TAVR planned for 08/02/15   *  Chronic anemia, due to renal disease/chronic disease.  On Aranesp. Got Ferric gluconate on 8/26.   *  ESRD.  Note treatment for hyperkalemia this AM.    *  IDDM.   PLAN   *  coags in AM x 2 days. LFTs in AM.    *  Add scheduled Zofran for next 24 hours. Added daily Protonix.     Azucena Freed  07/29/2015, 8:37 AM Pager: [phone]    Attending physician's note   I have taken an interval history, reviewed the chart and examined the patient. I agree with the Advanced Practitioner's note, impression and recommendations. Passive hepatic congestion leading to elevated LFTs. LFTs have significantly improved. Elevated INR due to liver dysfunction or low Vit K. Would give Vit K if ok cardiology-unsure about best approach with upcoming TAVR. Adding Protonix daily and Zofran for GERD, nausea. Continue to trend LFTs and trend PT/INR. GI signing off. Please call if needed.     Pricilla Riffle. Fuller Plan, MD Marval Regal [phone] pager Mon-Fri 8a-5p [phone] weekends, holidays and 5p-8a or per [facility]

## 2015-07-29 NOTE — Progress Notes (Deleted)
   Patient Name: JOSEMIGUEL SETTLE Date of Encounter: 07/29/2015   SUBJECTIVE  No chest pain or sob. No abdominal pain. Still complaining of nausea.   CURRENT MEDS . antiseptic oral rinse  7 mL Mouth Rinse BID  . aspirin  325 mg Oral Daily  . calcium carbonate  3 tablet Oral TID WC  . darbepoetin (ARANESP) injection - DIALYSIS  100 mcg Intravenous Q Mon-HD  . ferric gluconate (FERRLECIT/NULECIT) IV  62.5 mg Intravenous Q Fri-HD  . heparin  5,000 Units Subcutaneous 3 times per day  . insulin aspart  0-9 Units Subcutaneous TID WC  . metoprolol tartrate  12.5 mg Oral BID  . midodrine  10 mg Oral TID WC  . multivitamin  1 tablet Oral QHS  . ondansetron  4 mg Intravenous 3 times per day  . pantoprazole  40 mg Oral Q0600    OBJECTIVE  Filed Vitals:   07/29/15 0027 07/29/15 0037 07/29/15 0426 07/29/15 0500  BP: 85/43 90/40 91/34    Pulse:  79 76   Temp:   98.3 F (36.8 C)   TempSrc:   Oral   Resp:   18   Height:      Weight:    207 lb 10.8 oz (94.2 kg)  SpO2:   97%     Intake/Output Summary (Last 24 hours) at 07/29/15 1154 Last data filed at 07/29/15 0900  Gross per 24 hour  Intake    650 ml  Output      0 ml  Net    650 ml   Filed Weights   07/27/15 0745 07/28/15 0628 07/29/15 0500  Weight: 206 lb 12.7 oz (93.8 kg) 205 lb 6.4 oz (93.169 kg) 207 lb 10.8 oz (94.2 kg)    PHYSICAL EXAM  General: ill appearing male in NAD. Neuro: Alert and oriented X 3. Moves all extremities spontaneously. Psych: Normal affect. HEENT:  Normal  Neck: Supple without bruits or JVD. Lungs:  Resp regular and unlabored, CTA. Heart: RRR no s3, s4. Systolic murmurs. Abdomen: Soft, non-tender, non-distended, BS + x 4.  Extremities: No clubbing, cyanosis or edema. DP/PT/Radials 2+ and equal bilaterally.  Accessory Clinical Findings  CBC  Recent Labs  07/27/15 0345 07/28/15 0600  WBC 13.3* 11.5*  HGB 8.2* 7.8*  HCT 25.8* 24.1*  MCV 95.6 96.8  PLT 358 0000000   Basic Metabolic  Panel  Recent Labs  07/28/15 0600 07/29/15 0402  NA 135 132*  K 5.2* 6.2*  CL 95* 92*  CO2 24 22  GLUCOSE 117* 168*  BUN 58* 80*  CREATININE 8.81* 10.21*  CALCIUM 8.7* 8.9   Liver Function Tests  Recent Labs  07/28/15 0600 07/29/15 0402  AST 2549* 966*  ALT 2332* 1813*  ALKPHOS 126 147*  BILITOT 1.3* 1.6*  PROT 6.2* 6.7  ALBUMIN 3.0* 3.2*    Recent Labs  07/27/15 0841  LIPASE 27    ASSESSMENT AND PLAN 1. Severe aortic stenosis with diastolic heart failure - TAVR planned for next Tuesday.Stopped plavix if alternative TAVR access is required. Still nauseated.  Elevated LFT due to hepatic congestion leading to hepatic dysfunction and elevated INR. GI recommended Vit K. Will discuss with MD.    Jarrett Soho PA-C Pager [phone]

## 2015-07-29 NOTE — Procedures (Signed)
I have seen and examined this patient and agree with the plan of care    Hb 8.3    K 6.2   Change bath to 1 K   Jozie Wulf W 07/29/2015, 6:12 PM

## 2015-07-29 NOTE — Progress Notes (Signed)
K:6.2, MD notified, ordered Ca Gluconate IV and Kexelate, will continue to monitor, thanks Arvella Nigh RN

## 2015-07-29 NOTE — Progress Notes (Signed)
PROGRESS NOTE  BODIN WIGGER [id number] DOB: 1941-02-10 DOA: 07/18/2015 PCP: Thurman Coyer, MD    Brief History 74 year old male with history of diabetes mellitus, coronary artery disease, aortic stenosis, paroxysmal atrial fibrillation, symptomatic bradycardia status post permanent pacemaker on 07/11/2015, hypertension, hyperlipidemia, stroke, ESRD presented with increasing dyspnea, chest discomfort, and cough. Chest x-ray revealed pulmonary edema with bilateral pleural effusions suggesting fluid overload. Nephrology was consulted and the patient was taken to dialysis due to acute on chronic diastolic CHF worsened by severe aortic stenosis. He's been seen by cardiology and nephrology, dialysis for fluid removal. The patient underwent left heart catheterization 07/22/2015 which showed 100% mid RCA, 50% mid LAD. He is tentatively scheduled for TAVR on 08/02/15 Assessment/Plan: Acute respiratory failure with hypoxia due to acute on chronic diastolic CHF with underlying severe aortic stenosis  -Dialysis for fluid removal,  -appreciate cardiology and renal followup - blood pressure borderline--continue Midodrine -Supportive care with oxygen nebulizer treatment to continue.  -Metoprolol tartrate as tolerated by blood pressure. -Presently stable on 5 L nasal cannula Acute on chronic diastolic CHF -[id number] echocardiogram EF 55-60%, severe aortic stenosis, inferior and apical HK -HD to help with fluid management -Continue metoprolol tartrate as BP tolerates -Severe aortic stenosis contributing -daily weights -still appears clinically hypervolemic -07/29/15--HD---normally MWF  Transaminasemia/Ischemic Hepatitis -suspect due to transient episodes of hypotension and hepatic congestion from CHF -trend LFTs--tending down -07/18/2015 hepatitis B surface antigen negative  -Check hepatitis C antibody--neg -Check lipase--27  -Abdominal ultrasound--s/p cholecystectomy, no liver  lesions -Ehrlichia antibodies--neg -repeatRMSF serologies--no increase in IgG or IgM -8/25--received doxy 200mg  x 1 -appreciate GI opinion  Coagulopathy -secondary to liver dysfunction  -vitamin K given 9/2/216 -follow  Severe aortic stenosis.  -appreciate cardiology -TAVR planned 08/02/15  Hyperkalemia -Patient was hyperkalemic again today--Kayexalate given in the early morning -07/29/2015 dialysis -Repeat BMP in the morning   Chest pain in a setting of established coronary artery disease/Pericarditis Likely due to pericarditis-->conservative management per cardiology,  -continue aspirin and Plavix. -Elevated troponin due to demand ischemia in the setting of ESRD and decompensated CHF.  -L heart Cath on 07-22-15--100% mid RCA, 50% mid LAD  Paroxysmal atrial fibrillation [place] VASC 2 score - 2.  -going in and out of atrial fibrillation during this admission -on aspirin and Plavix.  -Per cardiology poor candidate for anticoagulation. -rate controlled  Incidental L Adrenal and R Kidney lesions CT scan ordered by cardiology - follow with PCP age appropriate outpatient workup.   Hypertension - BP currently soft, low-dose beta blocker if tolerated.   Hyperlipidemia - on home dose statin   Tic removal night of 07/19/2015. Unknown duration of tick exposure, given prophylaxis with doxycycline 200 mg 1 dose  Diabetes mellitus type 2  -04/27/2015 hemoglobin A1c 6.7  -CBG controlled without Levemir at this time  -NovoLog sliding scale   Family Communication: Daughter updated at beside Disposition Plan: Remain in hospital through 08/02/15   Procedures/Studies: Dg Chest 2 View  07/27/2015   CLINICAL DATA:  Pneumonia, CHF, chest pain and shortness of breath, but not today  EXAM: CHEST  2 VIEW  COMPARISON:  07/25/2015  FINDINGS: Bilateral interstitial thickening with prominence of the central pulmonary vasculature bilateral small pleural effusions. No pneumothorax.  Stable cardiomegaly. Thoracic aortic atherosclerosis. No acute osseous abnormality. Dual lead cardiac pacemaker.  IMPRESSION: Mild CHF.   Electronically Signed   By: Kathreen Devoid   On: 07/27/2015 17:36   Dg Chest 2 View  07/18/2015   CLINICAL DATA:  Cough, chest pain and shortness of breath tonight. Recent pacemaker placement.  EXAM: CHEST  2 VIEW  COMPARISON:  07/12/2015  FINDINGS: Dual lead right-sided pacemaker, leads intact. No pneumothorax. Cardiomegaly is unchanged. Increased perivascular haziness suggestive of pulmonary edema. Probable bilateral pleural effusions with blunting of the costophrenic angles and associated bibasilar atelectasis. No acute osseous abnormalities are seen.  IMPRESSION: Small pleural effusions and perivascular haziness suggestive of pulmonary edema. Recommend correlation for CHF.   Electronically Signed   By: Jeb Levering M.D.   On: 07/18/2015 02:08   Dg Chest 2 View  07/12/2015   CLINICAL DATA:  Status post pacemaker placement, low right shoulder pain  EXAM: CHEST - 2 VIEW  COMPARISON:  04/27/2015  FINDINGS: Cardiac shadow is stable. Lungs are well aerated bilaterally. No focal infiltrate or sizable effusion is noted. Mild central vascular congestion is noted without interstitial edema. A pacing device is now seen. No pneumothorax is noted. No bony abnormality is seen.  IMPRESSION: Mild vascular congestion.  Status post pacemaker without pneumothorax.   Electronically Signed   By: Inez Catalina M.D.   On: 07/12/2015 07:41   US Abdomen Complete  07/27/2015   CLINICAL DATA:  Mid abdominal pain, elevated LFTs  EXAM: ULTRASOUND ABDOMEN COMPLETE  COMPARISON:  07/21/2015  FINDINGS: Gallbladder: Surgically absent  Common bile duct: Diameter: 5.5 mm  Liver: No focal lesion identified. Within normal limits in parenchymal echogenicity.  IVC: No abnormality visualized.  Pancreas: Obscured by bowel gas  Spleen: Size and appearance within normal limits.  Right Kidney: Length: 9.7 cm.   9.6 cm  Left Kidney: Length: 3 cm simple cyst lower pole. Echogenicity within normal limits. No mass or hydronephrosis visualized.  Abdominal aorta: Largely obscured by bowel gas  Other findings: Bilateral pleural effusions  IMPRESSION: Study is somewhat limited. No acute findings identified other than bilateral pleural effusions.   Electronically Signed   By: Skipper Cliche M.D.   On: 07/27/2015 12:42   Ct Coronary Morp W/cta Cor W/score W/ca W/cm &/or Wo/cm  07/25/2015   ADDENDUM REPORT: 07/25/2015 19:05  CLINICAL DATA:  Aortic stenosis  EXAM: Cardiac TAVR CT  TECHNIQUE: The patient was scanned on a Philips 256 scanner. A 120 kV retrospective scan was triggered in the descending thoracic aorta at 111 HU's. Gantry rotation speed was 270 msecs and collimation was .9 mm. 5 mg of iv Metoprolol or nitro were given. The 3D data set was reconstructed in 5% intervals of the R-R cycle. Systolic and diastolic phases were analyzed on a dedicated work station using MPR, MIP and VRT modes. The patient received 80 cc of contrast.  FINDINGS: Aortic Valve: Trileaflet. Severely thickened and calcified with severely restricted leaflet opening.  Aorta: Minimal diffuse calcifications. Normal caliber. No dissection.  Sinotubular Junction:  36 x 33 mm  Ascending Thoracic Aorta:  38 x 36 mm  Aortic Arch:  Not visualized  Descending Thoracic Aorta:  29 x 28 mm  Sinus of Valsalva Measurements:  Non-coronary:  37 mm  Right -coronary:  38 mm  Left -coronary:  40 mm  Coronary Artery Height above Annulus:  Left Main:  12 mm  Right Coronary:  16 mm  Virtual Basal Annulus Measurements:  Maximum/Minimum Diameter:  28 x 23 mm  Perimeter:  97 mm  Area:  521 mm2  Optimum Fluoroscopic Angle for Delivery:  RAO 2 CRA 2  IMPRESSION: 1. Severely thickened and calcified aortic valve with severely restricted leaflet opening  and annular measurements suitable for deployment of 26 mm Edward-SAPIEN 3 valve.  2.  Sufficient annulus to coronary distance.   3.  Optimum Fluoroscopic Angle for Delivery:  RAO 2 CRA 2.  Ena Dawley   Electronically Signed   By: Ena Dawley   On: 07/25/2015 19:05   07/25/2015   EXAM: OVER-READ INTERPRETATION  CT CHEST  The following report is an over-read performed by radiologist Dr. Fonnie Birkenhead [facility] on 07/25/2015. This over-read does not include interpretation of cardiac or coronary anatomy or pathology. The coronary calcium score/coronary CTA interpretation by the cardiologist is attached.  COMPARISON:  07/21/2015.  FINDINGS: Pulmonary arteries are rather enlarged. Low right paratracheal lymph nodes measure up to 11 mm, likely reactive. Visualized portions of the lungs show septal thickening with a focal area of new consolidation in the subpleural lingula. Bilateral effusions and compressive atelectasis in both lower lobes, incompletely imaged.  IMPRESSION: 1. Congestive heart failure. 2. New infectious or inflammatory consolidation in the lingula. 3. Pulmonary arterial hypertension.  Electronically Signed: By: Lorin Picket M.D. On: 07/25/2015 15:19   Dg Abd Portable 1v  07/26/2015   CLINICAL DATA:  Nausea  EXAM: PORTABLE ABDOMEN - 1 VIEW  COMPARISON:  Abdominal CT from 5 days ago  FINDINGS: Small bilateral kidneys with high-density appearance from IV contrast yesterday. Contrast is seen in the urinary bladder, including large diverticulum. Nonobstructive bowel gas pattern. No concerning intra-abdominal mass effect. Cholecystectomy.  IMPRESSION: 1. Nonobstructive bowel gas pattern. 2. Large bladder diverticulum.   Electronically Signed   By: Monte Fantasia M.D.   On: 07/26/2015 08:51   Ct Angio Chest Aorta W/cm &/or Wo/cm  07/23/2015   CLINICAL DATA:  74 year old male with history of severe aortic stenosis. Preprocedural study prior to potential transcatheter aortic valve replacement (TAVR).  EXAM: CT ANGIOGRAPHY CHEST, ABDOMEN AND PELVIS  TECHNIQUE: Multidetector CT imaging through the chest,  abdomen and pelvis was performed using the standard protocol during bolus administration of intravenous contrast. Multiplanar reconstructed images and MIPs were obtained and reviewed to evaluate the vascular anatomy.  CONTRAST:  143mL OMNIPAQUE IOHEXOL 350 MG/ML SOLN  COMPARISON:  No priors.  FINDINGS: CTA CHEST FINDINGS  Mediastinum/Lymph Nodes: Heart size is enlarged. There is no significant pericardial fluid, thickening or pericardial calcification. There is atherosclerosis of the thoracic aorta, the great vessels of the mediastinum and the coronary arteries, including calcified atherosclerotic plaque in the left main, left anterior descending, left circumflex and right coronary arteries. Severe thickening and calcification of the aortic valve. Calcifications of the mitral valve and mitral annulus. 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 Dilatation of the pulmonic trunk (4.8 cm in diameter), suggestive of pulmonary arterial hypertension. Multiple borderline enlarged mediastinal lymph nodes measuring up to 9 mm in short axis. No definite pathologically enlarged mediastinal or hilar lymph nodes. Esophagus is unremarkable in appearance. No axillary lymphadenopathy.  Lungs/Pleura: Moderate bilateral pleural effusions layering dependently with extensive passive atelectasis in the dependent portions of the lower lobes of the lungs bilaterally. Mild diffuse ground-glass attenuation and interlobular septal thickening in the lungs suggestive of a background of mild interstitial pulmonary edema. No acute consolidative airspace disease. No suspicious appearing pulmonary nodule or mass.  Musculoskeletal/Soft Tissues: There are no aggressive appearing lytic or blastic lesions noted in the visualized portions of the skeleton.  CTA ABDOMEN AND PELVIS FINDINGS  Hepatobiliary: No cystic or solid hepatic lesions. No intra or extrahepatic  biliary  ductal dilatation. Status post cholecystectomy.  Pancreas: No pancreatic mass. No pancreatic ductal dilatation. No pancreatic or peripancreatic fluid or inflammatory changes.  Spleen: Unremarkable.  Adrenals/Urinary Tract: 2.2 cm left adrenal nodule is indeterminate. Right adrenal gland is normal in appearance. Exophytic 3.5 cm simple cyst in the posterior aspect of the interpolar region of the left kidney. In the lateral interpolar region of the right kidney there is a 1.7 cm high attenuation (74 HU) lesion which could represent an enhancing lesion or a proteinaceous/hemorrhagic cyst. No hydroureteronephrosis. Urinary bladder is remarkable for a 5 cm diverticulum in the posterior aspect of the urinary bladder on the left side.  Stomach/Bowel: The appearance of the stomach is normal. Several diverticulae of the duodenum are noted. No surrounding inflammatory changes to suggest associated duodenal diverticulitis. No pathologic dilatation of small bowel or colon. Numerous colonic diverticulae are noted, without surrounding inflammatory changes to suggest an acute diverticulitis at this time. Normal appendix.  Vascular/Lymphatic: Vascular findings and measurements pertinent to potential TAVR procedure, as detailed below. No aneurysm or dissection identified. No lymphadenopathy noted in the abdomen or pelvis.  Reproductive: Prostate gland and seminal vesicles are unremarkable in appearance.  Other: Small bilateral inguinal hernias containing only fat incidentally noted. No significant volume of ascites. No pneumoperitoneum.  Musculoskeletal: Chronic appearing compression fracture of superior endplate of L1 with approximately 15% loss of anterior vertebral body height. There are no aggressive appearing lytic or blastic lesions noted in the visualized portions of the skeleton.  VASCULAR MEASUREMENTS PERTINENT TO TAVR:  AORTA:  Minimal Aortic Diameter -  16 x 16 mm  Severity of Aortic Calcification -  severe  RIGHT PELVIS:   Right Common Iliac Artery -  Minimal Diameter - 9.5 x 8.1 mm  Tortuosity - mild  Calcification - moderate  Right External Iliac Artery -  Minimal Diameter - 8.7 x 6.9 mm  Tortuosity - mild  Calcification - mild  Right Common Femoral Artery -  Minimal Diameter - Proximal 9.0 x 7.8 mm; Distal 8.6 x 3.6 mm  Tortuosity - mild  Calcification - moderate proximally, severe distally  LEFT PELVIS:  Left Common Iliac Artery -  Minimal Diameter - 11.6 x 10.0 mm  Tortuosity - mild  Calcification - moderate  Left External Iliac Artery -  Minimal Diameter - 8.8 x 7.8 mm  Tortuosity - mild  Calcification - mild  Left Common Femoral Artery -  Minimal Diameter - Proximal 7.0 x 6.1 mm; Distal 6.5 x 5.8 mm  Tortuosity - mild  Calcification - moderate proximally, severe distally  Review of the MIP images confirms the above findings.  IMPRESSION: 1. Vascular findings and measurements pertinent to potential TAVR procedure, as detailed above. This patient does appear to have suitable pelvic arterial access bilaterally, particularly if vascular access is achieved in the proximal common femoral arteries (distal common femoral arteries are not suitable for vascular access). 2. Severe thickening calcifications of the aortic valve, compatible with the reported clinical history of aortic stenosis. 3. Severe dilatation of the pulmonic trunk and main pulmonary arteries, suggestive of pulmonary arterial hypertension. 4. Cardiomegaly with evidence of mild interstitial pulmonary edema and moderate bilateral pleural effusions, suggesting congestive heart failure. Numerous borderline enlarged mediastinal lymph nodes are likely secondary to underlying congestive heart failure. 5. 2.2 cm left adrenal nodule is indeterminate. Statistically, this is likely to represent a small adenoma, and could be further characterized with nonemergent noncontrast CT the abdomen. 6. 1.7 cm high attenuation lesion in the  interpolar region of the right kidney may  represent either a proteinaceous/hemorrhagic cyst or a solid enhancing lesion. Ideally, this would be characterized by MRI, however, given the patient's pacemaker, further evaluation with nonemergent renal ultrasound is strongly recommended at this time. Alternatively, if this lesion is of similar high attenuation on the followup noncontrast CT the abdomen (recommended for number 6 above), no further characterization may be needed. 7. Additional incidental findings, as above.   Electronically Signed   By: Vinnie Langton M.D.   On: 07/23/2015 16:01   Ct Angio Abd/pel W/ And/or W/o  07/23/2015   CLINICAL DATA:  74 year old male with history of severe aortic stenosis. Preprocedural study prior to potential transcatheter aortic valve replacement (TAVR).  EXAM: CT ANGIOGRAPHY CHEST, ABDOMEN AND PELVIS  TECHNIQUE: Multidetector CT imaging through the chest, abdomen and pelvis was performed using the standard protocol during bolus administration of intravenous contrast. Multiplanar reconstructed images and MIPs were obtained and reviewed to evaluate the vascular anatomy.  CONTRAST:  154mL OMNIPAQUE IOHEXOL 350 MG/ML SOLN  COMPARISON:  No priors.  FINDINGS: CTA CHEST FINDINGS  Mediastinum/Lymph Nodes: Heart size is enlarged. There is no significant pericardial fluid, thickening or pericardial calcification. There is atherosclerosis of the thoracic aorta, the great vessels of the mediastinum and the coronary arteries, including calcified atherosclerotic plaque in the left main, left anterior descending, left circumflex and right coronary arteries. Severe thickening and calcification of the aortic valve. Calcifications of the mitral valve and mitral annulus. 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 Dilatation of the pulmonic trunk (4.8 cm in diameter), suggestive of pulmonary arterial hypertension. Multiple borderline enlarged  mediastinal lymph nodes measuring up to 9 mm in short axis. No definite pathologically enlarged mediastinal or hilar lymph nodes. Esophagus is unremarkable in appearance. No axillary lymphadenopathy.  Lungs/Pleura: Moderate bilateral pleural effusions layering dependently with extensive passive atelectasis in the dependent portions of the lower lobes of the lungs bilaterally. Mild diffuse ground-glass attenuation and interlobular septal thickening in the lungs suggestive of a background of mild interstitial pulmonary edema. No acute consolidative airspace disease. No suspicious appearing pulmonary nodule or mass.  Musculoskeletal/Soft Tissues: There are no aggressive appearing lytic or blastic lesions noted in the visualized portions of the skeleton.  CTA ABDOMEN AND PELVIS FINDINGS  Hepatobiliary: No cystic or solid hepatic lesions. No intra or extrahepatic biliary ductal dilatation. Status post cholecystectomy.  Pancreas: No pancreatic mass. No pancreatic ductal dilatation. No pancreatic or peripancreatic fluid or inflammatory changes.  Spleen: Unremarkable.  Adrenals/Urinary Tract: 2.2 cm left adrenal nodule is indeterminate. Right adrenal gland is normal in appearance. Exophytic 3.5 cm simple cyst in the posterior aspect of the interpolar region of the left kidney. In the lateral interpolar region of the right kidney there is a 1.7 cm high attenuation (74 HU) lesion which could represent an enhancing lesion or a proteinaceous/hemorrhagic cyst. No hydroureteronephrosis. Urinary bladder is remarkable for a 5 cm diverticulum in the posterior aspect of the urinary bladder on the left side.  Stomach/Bowel: The appearance of the stomach is normal. Several diverticulae of the duodenum are noted. No surrounding inflammatory changes to suggest associated duodenal diverticulitis. No pathologic dilatation of small bowel or colon. Numerous colonic diverticulae are noted, without surrounding inflammatory changes to suggest  an acute diverticulitis at this time. Normal appendix.  Vascular/Lymphatic: Vascular findings and measurements pertinent to potential TAVR procedure, as detailed below. No aneurysm or dissection identified. No lymphadenopathy noted  in the abdomen or pelvis.  Reproductive: Prostate gland and seminal vesicles are unremarkable in appearance.  Other: Small bilateral inguinal hernias containing only fat incidentally noted. No significant volume of ascites. No pneumoperitoneum.  Musculoskeletal: Chronic appearing compression fracture of superior endplate of L1 with approximately 15% loss of anterior vertebral body height. There are no aggressive appearing lytic or blastic lesions noted in the visualized portions of the skeleton.  VASCULAR MEASUREMENTS PERTINENT TO TAVR:  AORTA:  Minimal Aortic Diameter -  16 x 16 mm  Severity of Aortic Calcification -  severe  RIGHT PELVIS:  Right Common Iliac Artery -  Minimal Diameter - 9.5 x 8.1 mm  Tortuosity - mild  Calcification - moderate  Right External Iliac Artery -  Minimal Diameter - 8.7 x 6.9 mm  Tortuosity - mild  Calcification - mild  Right Common Femoral Artery -  Minimal Diameter - Proximal 9.0 x 7.8 mm; Distal 8.6 x 3.6 mm  Tortuosity - mild  Calcification - moderate proximally, severe distally  LEFT PELVIS:  Left Common Iliac Artery -  Minimal Diameter - 11.6 x 10.0 mm  Tortuosity - mild  Calcification - moderate  Left External Iliac Artery -  Minimal Diameter - 8.8 x 7.8 mm  Tortuosity - mild  Calcification - mild  Left Common Femoral Artery -  Minimal Diameter - Proximal 7.0 x 6.1 mm; Distal 6.5 x 5.8 mm  Tortuosity - mild  Calcification - moderate proximally, severe distally  Review of the MIP images confirms the above findings.  IMPRESSION: 1. Vascular findings and measurements pertinent to potential TAVR procedure, as detailed above. This patient does appear to have suitable pelvic arterial access bilaterally, particularly if vascular access is achieved in the  proximal common femoral arteries (distal common femoral arteries are not suitable for vascular access). 2. Severe thickening calcifications of the aortic valve, compatible with the reported clinical history of aortic stenosis. 3. Severe dilatation of the pulmonic trunk and main pulmonary arteries, suggestive of pulmonary arterial hypertension. 4. Cardiomegaly with evidence of mild interstitial pulmonary edema and moderate bilateral pleural effusions, suggesting congestive heart failure. Numerous borderline enlarged mediastinal lymph nodes are likely secondary to underlying congestive heart failure. 5. 2.2 cm left adrenal nodule is indeterminate. Statistically, this is likely to represent a small adenoma, and could be further characterized with nonemergent noncontrast CT the abdomen. 6. 1.7 cm high attenuation lesion in the interpolar region of the right kidney may represent either a proteinaceous/hemorrhagic cyst or a solid enhancing lesion. Ideally, this would be characterized by MRI, however, given the patient's pacemaker, further evaluation with nonemergent renal ultrasound is strongly recommended at this time. Alternatively, if this lesion is of similar high attenuation on the followup noncontrast CT the abdomen (recommended for number 6 above), no further characterization may be needed. 7. Additional incidental findings, as above.   Electronically Signed   By: Vinnie Langton M.D.   On: 07/23/2015 16:01         Subjective: Patient had loose stools with Kayexalate. Denies any fevers, chills, chest pain, shortness breath, vomiting, abdominal pain. He has some nausea. Denies any headaches.  Objective: Filed Vitals:   07/29/15 0500 07/29/15 1400 07/29/15 1645 07/29/15 1700  BP:  96/43 107/62 102/60  Pulse:  78 83 86  Temp:  97.7 F (36.5 C)    TempSrc:  Oral    Resp:  20 18 20   Height:      Weight: 94.2 kg (207 lb 10.8 oz)  SpO2:  98% 96% 95%    Intake/Output Summary (Last 24 hours) at  07/29/15 1708 Last data filed at 07/29/15 0900  Gross per 24 hour  Intake    650 ml  Output      0 ml  Net    650 ml   Weight change: 0.4 kg (14.1 oz) Exam:   General:  Pt is alert, follows commands appropriately, not in acute distress  HEENT: No icterus, No thrush, No neck mass, [place]/AT  Cardiovascular: RRR, S1/S2, no rubs, no gallops  Respiratory: Bibasilar crackles. No wheezing. Good air movement.  Abdomen: Soft/+BS, non tender, non distended, no guarding  Extremities: 1+LE edema, No lymphangitis, No petechiae, No rashes, no synovitis  Data Reviewed: Basic Metabolic Panel:  Recent Labs Lab 07/23/15 0321 07/25/15 1948 07/27/15 0345 07/28/15 0600 07/29/15 0402  NA 134* 132* 132* 135 132*  K 4.1 5.4* 7.2* 5.2* 6.2*  CL 95* 92* 90* 95* 92*  CO2 28 23 16* 24 22  GLUCOSE 149* 176* 172* 117* 168*  BUN 25* 64* 84* 58* 80*  CREATININE 3.96* 9.52* 11.84* 8.81* 10.21*  CALCIUM 8.5* 9.3 9.2 8.7* 8.9  PHOS  --  6.8*  --   --   --    Liver Function Tests:  Recent Labs Lab 07/25/15 1948 07/27/15 0345 07/28/15 0600 07/29/15 0402  AST  --  2886* 2549* 966*  ALT  --  1562* 2332* 1813*  ALKPHOS  --  113 126 147*  BILITOT  --  1.6* 1.3* 1.6*  PROT  --  6.9 6.2* 6.7  ALBUMIN 3.3* 3.2* 3.0* 3.2*    Recent Labs Lab 07/27/15 0841  LIPASE 27   No results for input(s): AMMONIA in the last 168 hours. CBC:  Recent Labs Lab 07/23/15 0321 07/27/15 0345 07/28/15 0600  WBC 11.1* 13.3* 11.5*  HGB 8.5* 8.2* 7.8*  HCT 26.0* 25.8* 24.1*  MCV 94.2 95.6 96.8  PLT 357 358 258   Cardiac Enzymes: No results for input(s): CKTOTAL, CKMB, CKMBINDEX, TROPONINI in the last 168 hours. BNP: Invalid input(s): POCBNP CBG:  Recent Labs Lab 07/28/15 1643 07/28/15 2130 07/29/15 0618 07/29/15 1139 07/29/15 1601  GLUCAP 160* 131* 156* 154* 165*    Recent Results (from the past 240 hour(s))  Culture, blood (routine x 2)     Status: None (Preliminary result)   Collection Time:  07/27/15  8:30 AM  Result Value Ref Range Status   Specimen Description BLOOD  Final   Special Requests   Final    BOTTLES DRAWN AEROBIC AND ANAEROBIC L AVF RED PORT 10ML   Culture NO GROWTH 2 DAYS  Final   Report Status PENDING  Incomplete  Culture, blood (routine x 2)     Status: None (Preliminary result)   Collection Time: 07/27/15  8:45 AM  Result Value Ref Range Status   Specimen Description BLOOD  Final   Special Requests   Final    BOTTLES DRAWN AEROBIC AND ANAEROBIC L AVF REDPORT 10 ML   Culture NO GROWTH 2 DAYS  Final   Report Status PENDING  Incomplete     Scheduled Meds: . antiseptic oral rinse  7 mL Mouth Rinse BID  . aspirin  325 mg Oral Daily  . calcium carbonate  3 tablet Oral TID WC  . darbepoetin (ARANESP) injection - DIALYSIS  100 mcg Intravenous Q Mon-HD  . ferric gluconate (FERRLECIT/NULECIT) IV  62.5 mg Intravenous Q Fri-HD  . heparin  5,000 Units Subcutaneous 3 times per  day  . insulin aspart  0-9 Units Subcutaneous TID WC  . metoprolol tartrate  12.5 mg Oral BID  . midodrine  10 mg Oral TID WC  . multivitamin  1 tablet Oral QHS  . ondansetron  4 mg Intravenous 3 times per day  . pantoprazole  40 mg Oral Q0600   Continuous Infusions:    Haizel Gatchell, DO  [facility] Pager [phone]  If 7PM-7AM, please contact night-coverage [email or web address] Password [email or web address] 07/29/2015, 5:08 PM   LOS: 11 days

## 2015-07-29 NOTE — Progress Notes (Signed)
Initial Nutrition Assessment  INTERVENTION:  Provide Nepro Shake po BID, each supplement provides 425 kcal and 19 grams protein   NUTRITION DIAGNOSIS:   Inadequate oral intake related to nausea as evidenced by meal completion < 50%.   GOAL:   Patient will meet greater than or equal to 90% of their needs   MONITOR:   PO intake, Supplement acceptance, Labs, Weight trends, Skin, I & O's  REASON FOR ASSESSMENT:   LOS    ASSESSMENT:   74 y.o. male with a history of ESRD on HD, P. Afib, CAD, HTN and DM2 and recent Pacemaker placement who presents to the ED with complaints of increased SOB and Cough over the past 24 hours.  Pt asleep at time of visit; per MD note pt had a very uncomfortable night.  Per gastroenterologist note, pt continues to complain of nausea; had BM yesterday. Pt's weight has dropped >12 lbs in the past few weeks- 5% weight loss is significant for time frame. Per nursing notes, prior to 8/28 pt was eating 75-100% of most meals but, since then he has only been eating 2-25% of most meals.   Medications: calcium carbonate, Zofran, Protonix, Rena-Vit Labs: low sodium and chloride, high potassium, high BUN/creatinine, high AST/ALT, low GFR  Diet Order:  Diet renal/carb modified with fluid restriction Diet-HS Snack?: Nothing; Room service appropriate?: Yes; Fluid consistency:: Thin  Skin:  Wound (see comment) (closed incision on right chest)  Last BM:  8/31  Height:   Ht Readings from Last 1 Encounters:  07/18/15 5\' 11"  (1.803 m)    Weight:   Wt Readings from Last 1 Encounters:  07/29/15 207 lb 10.8 oz (94.2 kg)    Ideal Body Weight:  75.5 kg  BMI:  Body mass index is 28.98 kg/(m^2).  Estimated Nutritional Needs:   Kcal:  [id number]  Protein:  90-100 grams  Fluid:  per MD  EDUCATION NEEDS:   No education needs identified at this time  [place], LDN Inpatient Clinical Dietitian Pager: [phone] After Hours Pager: [phone]

## 2015-07-29 NOTE — Progress Notes (Signed)
SUBJECTIVE:  The patient had a very uncomfortable night.  He reports chest pain off and on.  He has had intermittent SOB.     PHYSICAL EXAM Filed Vitals:   07/29/15 0027 07/29/15 0037 07/29/15 0426 07/29/15 0500  BP: 85/43 90/40 91/34    Pulse:  79 76   Temp:   98.3 F (36.8 C)   TempSrc:   Oral   Resp:   18   Height:      Weight:    207 lb 10.8 oz (94.2 kg)  SpO2:   97%    General:  No acute distress but chronically ill Lungs:  Decreased breath sounds at the bases Heart:  RRR, murmur Abdomen:  Positive bowel sounds, no rebound no guarding Extremities:  No edema  Neuro:  Nonfocal  LABS:  Results for orders placed or performed during the hospital encounter of 07/18/15 (from the past 24 hour(s))  Glucose, capillary     Status: Abnormal   Collection Time: 07/28/15  4:43 PM  Result Value Ref Range   Glucose-Capillary 160 (H) 65 - 99 mg/dL  Hepatitis A antibody, IgM     Status: None   Collection Time: 07/28/15  6:15 PM  Result Value Ref Range   Hep A IgM Negative Negative  Glucose, capillary     Status: Abnormal   Collection Time: 07/28/15  9:30 PM  Result Value Ref Range   Glucose-Capillary 131 (H) 65 - 99 mg/dL  Protime-INR     Status: Abnormal   Collection Time: 07/29/15  4:02 AM  Result Value Ref Range   Prothrombin Time 22.3 (H) 11.6 - 15.2 seconds   INR 1.96 (H) 0.00 - 1.49  Comprehensive metabolic panel     Status: Abnormal   Collection Time: 07/29/15  4:02 AM  Result Value Ref Range   Sodium 132 (L) 135 - 145 mmol/L   Potassium 6.2 (HH) 3.5 - 5.1 mmol/L   Chloride 92 (L) 101 - 111 mmol/L   CO2 22 22 - 32 mmol/L   Glucose, Bld 168 (H) 65 - 99 mg/dL   BUN 80 (H) 6 - 20 mg/dL   Creatinine, Ser 10.21 (H) 0.61 - 1.24 mg/dL   Calcium 8.9 8.9 - 10.3 mg/dL   Total Protein 6.7 6.5 - 8.1 g/dL   Albumin 3.2 (L) 3.5 - 5.0 g/dL   AST 966 (H) 15 - 41 U/L   ALT 1813 (H) 17 - 63 U/L   Alkaline Phosphatase 147 (H) 38 - 126 U/L   Total Bilirubin 1.6 (H) 0.3 - 1.2  mg/dL   GFR calc non Af Amer 4 (L) >60 mL/min   GFR calc Af Amer 5 (L) >60 mL/min   Anion gap 18 (H) 5 - 15  Glucose, capillary     Status: Abnormal   Collection Time: 07/29/15  6:18 AM  Result Value Ref Range   Glucose-Capillary 156 (H) 65 - 99 mg/dL   Comment 1 Notify RN    Comment 2 Document in Chart   Glucose, capillary     Status: Abnormal   Collection Time: 07/29/15 11:39 AM  Result Value Ref Range   Glucose-Capillary 154 (H) 65 - 99 mg/dL   Comment 1 Document in Chart     Intake/Output Summary (Last 24 hours) at 07/29/15 1158 Last data filed at 07/29/15 0900  Gross per 24 hour  Intake    650 ml  Output      0 ml  Net    650  ml    CT:  1. Vascular findings and measurements pertinent to potential TAVR procedure, as detailed above. This patient does appear to have suitable pelvic arterial access bilaterally, particularly if vascular access is achieved in the proximal common femoral arteries (distal common femoral arteries are not suitable for vascular access). 2. Severe thickening calcifications of the aortic valve, compatible with the reported clinical history of aortic stenosis. 3. Severe dilatation of the pulmonic trunk and main pulmonary arteries, suggestive of pulmonary arterial hypertension. 4. Cardiomegaly with evidence of mild interstitial pulmonary edema and moderate bilateral pleural effusions, suggesting congestive heart failure. Numerous borderline enlarged mediastinal lymph nodes are likely secondary to underlying congestive heart failure. 5. 2.2 cm left adrenal nodule is indeterminate. Statistically, this is likely to represent a small adenoma, and could be further characterized with nonemergent noncontrast CT the abdomen. 6. 1.7 cm high attenuation lesion in the interpolar region of the right kidney may represent either a proteinaceous/hemorrhagic cyst or a solid enhancing lesion. Ideally, this would be characterized by MRI, however, given the patient's  pacemaker, further evaluation with nonemergent renal ultrasound is strongly recommended at this time. Alternatively, if this lesion is of similar high attenuation on the followup noncontrast CT the abdomen (recommended for number 6 above), no further characterization may be needed. 7. Additional incidental findings, as above.  ASSESSMENT AND PLAN:  AS:    For TAVR on Tuesday.  Plavix on hold.  Continue current therapy.    CAD:   Medical management.  CAD stable since previous cath.    ACUTE ON CHRONIC RESPIRATORY FAILURE:  No acute distress but he feels uncomforable.    ESRD:  Dialysis MWF.   Volume management per dialysis.    ELEVATED LFTs:   INR 1.96.  I will give vit K.   Enzymes are trending down.    [name] [facility] 07/29/2015 11:58 AM

## 2015-07-29 NOTE — Care Management Important Message (Signed)
Important Message  Patient Details  Name: Jeffrey [name] MRN: [id number] Date of Birth: 1941-11-04   Medicare Important Message Given:  Yes-fourth notification given    Pricilla Handler 07/29/2015, 12:42 PM

## 2015-07-29 NOTE — Progress Notes (Signed)
      [address]       [place] [address]             [phone]     CARDIOTHORACIC SURGERY PROGRESS NOTE  7 Days Post-Op  S/P Procedure(s) (LRB): Right/Left Heart Cath and Coronary Angiography (N/A)  Subjective: Nausea persists.  Currently denies SOB  Objective: Vital signs in last 24 hours: Temp:  [97.8 F (36.6 C)-98.3 F (36.8 C)] 98.3 F (36.8 C) (09/02 0426) Pulse Rate:  [70-86] 76 (09/02 0426) Cardiac Rhythm:  [-] Ventricular paced (09/02 0700) Resp:  [16-18] 18 (09/02 0426) BP: (85-103)/(34-68) 91/34 mmHg (09/02 0426) SpO2:  [96 %-99 %] 97 % (09/02 0426) Weight:  [94.2 kg (207 lb 10.8 oz)] 94.2 kg (207 lb 10.8 oz) (09/02 0500)  Physical Exam:  Rhythm:   paced  Breath sounds: Fairly clear  Heart sounds:  RRR w/ systolic murmur  Incisions:  n/a  Abdomen:  Soft, non-distended, minimally tender  Extremities:  Warm, well-perfused   Intake/Output from previous day: 09/01 0701 - 09/02 0700 In: 770 [P.O.:660; IV Piggyback:110] Out: 0  Intake/Output this shift: Total I/O In: 120 [P.O.:120] Out: 0   Lab Results:  Recent Labs  07/27/15 0345 07/28/15 0600  WBC 13.3* 11.5*  HGB 8.2* 7.8*  HCT 25.8* 24.1*  PLT 358 258   BMET:  Recent Labs  07/28/15 0600 07/29/15 0402  NA 135 132*  K 5.2* 6.2*  CL 95* 92*  CO2 24 22  GLUCOSE 117* 168*  BUN 58* 80*  CREATININE 8.81* 10.21*  CALCIUM 8.7* 8.9    CBG (last 3)   Recent Labs  07/28/15 2130 07/29/15 0618 07/29/15 1139  GLUCAP 131* 156* 154*   PT/INR:   Recent Labs  07/29/15 0402  LABPROT 22.3*  INR 1.96*    CXR:  CHEST 2 VIEW  COMPARISON: 07/25/2015  FINDINGS: Bilateral interstitial thickening with prominence of the central pulmonary vasculature bilateral small pleural effusions. No pneumothorax. Stable cardiomegaly. Thoracic aortic atherosclerosis. No acute osseous abnormality. Dual lead cardiac pacemaker.  IMPRESSION: Mild CHF.   Electronically  Signed  By: Kathreen Devoid  On: 07/27/2015 17:36  Assessment/Plan: S/P Procedure(s) (LRB): Right/Left Heart Cath and Coronary Angiography (N/A)  I remain concerned regarding Mr Meete's multiple ongoing problems.   Transaminase levels continue to drift down, presumably related to hypoperfusion injury to the liver or "shock liver".  I don't think you can attribute the previous very high levels of SGOT and SGPT to "passive congestion."  He does not have much ongoing fluid overload or CHF.  Hyperkalemia persists.  I remain hopeful that he will improve with an aggressive approach to his dialysis treatments so that he will be stable enough to proceed with TAVR next week.  Perhaps he needs more frequent HD treatments for short duration or even transfer to ICU for CRRT.  I spent in excess of 15 minutes during the conduct of this hospital encounter and >50% of this time involved direct face-to-face encounter with the patient for counseling and/or coordination of their care.   Rexene Alberts 07/29/2015 1:49 PM

## 2015-07-30 DIAGNOSIS — J9601 Acute respiratory failure with hypoxia: Secondary | ICD-10-CM

## 2015-07-30 DIAGNOSIS — I25110 Atherosclerotic heart disease of native coronary artery with unstable angina pectoris: Secondary | ICD-10-CM

## 2015-07-30 LAB — HEPATITIS B CORE ANTIBODY, IGM: HEP B C IGM: NEGATIVE

## 2015-07-30 LAB — GLUCOSE, CAPILLARY
GLUCOSE-CAPILLARY: 106 mg/dL — AB (ref 65–99)
GLUCOSE-CAPILLARY: 118 mg/dL — AB (ref 65–99)
GLUCOSE-CAPILLARY: 148 mg/dL — AB (ref 65–99)
GLUCOSE-CAPILLARY: 177 mg/dL — AB (ref 65–99)
Glucose-Capillary: 172 mg/dL — ABNORMAL HIGH (ref 65–99)

## 2015-07-30 LAB — COMPREHENSIVE METABOLIC PANEL
ALK PHOS: 134 U/L — AB (ref 38–126)
ALT: 1199 U/L — AB (ref 17–63)
AST: 358 U/L — AB (ref 15–41)
Albumin: 2.9 g/dL — ABNORMAL LOW (ref 3.5–5.0)
Anion gap: 16 — ABNORMAL HIGH (ref 5–15)
BUN: 47 mg/dL — AB (ref 6–20)
CALCIUM: 8.7 mg/dL — AB (ref 8.9–10.3)
CO2: 27 mmol/L (ref 22–32)
CREATININE: 7.2 mg/dL — AB (ref 0.61–1.24)
Chloride: 91 mmol/L — ABNORMAL LOW (ref 101–111)
GFR, EST AFRICAN AMERICAN: 8 mL/min — AB (ref >60)
GFR, EST NON AFRICAN AMERICAN: 7 mL/min — AB (ref >60)
Glucose, Bld: 121 mg/dL — ABNORMAL HIGH (ref 65–99)
Potassium: 3.9 mmol/L (ref 3.5–5.1)
Sodium: 134 mmol/L — ABNORMAL LOW (ref 135–145)
Total Bilirubin: 1.5 mg/dL — ABNORMAL HIGH (ref 0.3–1.2)
Total Protein: 6 g/dL — ABNORMAL LOW (ref 6.5–8.1)

## 2015-07-30 LAB — PROTIME-INR
INR: 1.73 — ABNORMAL HIGH (ref 0.00–1.49)
Prothrombin Time: 20.2 seconds — ABNORMAL HIGH (ref 11.6–15.2)

## 2015-07-30 LAB — HEPATITIS B E ANTIGEN: Hep B E Ag: NEGATIVE

## 2015-07-30 LAB — HEPATITIS B SURFACE ANTIGEN: HEP B S AG: NEGATIVE

## 2015-07-30 MED ORDER — ONDANSETRON HCL 4 MG/2ML IJ SOLN
4.0000 mg | Freq: Once | INTRAMUSCULAR | Status: AC
  Administered 2015-07-30: 4 mg via INTRAVENOUS
  Filled 2015-07-30: qty 2

## 2015-07-30 MED ORDER — PHYTONADIONE 5 MG PO TABS
10.0000 mg | ORAL_TABLET | Freq: Every day | ORAL | Status: DC
  Administered 2015-07-30 – 2015-08-03 (×4): 10 mg via ORAL
  Filled 2015-07-30 (×6): qty 2

## 2015-07-30 NOTE — Progress Notes (Signed)
Patient Name: Jeffrey [name] Date of Encounter: 07/30/2015  Principal Problem:   Acute respiratory failure Active Problems:   DM (diabetes mellitus), type 2   HLD (hyperlipidemia)   CAD (coronary artery disease)   Chest pain   Fluid overload   ESRD on hemodialysis   Acute on chronic diastolic CHF (congestive heart failure)   Severe aortic stenosis   Hypervolemia   Aortic stenosis   Hyperkalemia   Length of Stay: 12  SUBJECTIVE  No further chest pain, no dyspnea lying fully horizontal in bed. Transaminases improving rapidly.  CURRENT MEDS . antiseptic oral rinse  7 mL Mouth Rinse BID  . aspirin  325 mg Oral Daily  . calcium carbonate  3 tablet Oral TID WC  . darbepoetin (ARANESP) injection - DIALYSIS  100 mcg Intravenous Q Mon-HD  . ferric gluconate (FERRLECIT/NULECIT) IV  62.5 mg Intravenous Q Fri-HD  . heparin  5,000 Units Subcutaneous 3 times per day  . insulin aspart  0-9 Units Subcutaneous TID WC  . metoprolol tartrate  12.5 mg Oral BID  . midodrine  10 mg Oral TID WC  . multivitamin  1 tablet Oral QHS  . ondansetron  4 mg Intravenous 3 times per day  . pantoprazole  40 mg Oral Q0600    OBJECTIVE   Intake/Output Summary (Last 24 hours) at 07/30/15 1153 Last data filed at 07/30/15 1100  Gross per 24 hour  Intake    680 ml  Output   2005 ml  Net  -1325 ml   Filed Weights   07/29/15 1630 07/29/15 2045 07/30/15 0500  Weight: 207 lb 14.3 oz (94.3 kg) 203 lb 7.8 oz (92.3 kg) 203 lb (92.08 kg)    PHYSICAL EXAM Filed Vitals:   07/29/15 2045 07/30/15 0500 07/30/15 0948 07/30/15 1057  BP: 105/61 97/51 100/48 110/58  Pulse: 79 75 77 75  Temp: 97.5 F (36.4 C) 97 F (36.1 C)    TempSrc: Oral Oral    Resp: 21 18    Height:      Weight: 203 lb 7.8 oz (92.3 kg) 203 lb (92.08 kg)    SpO2: 98% 98%     General: Alert, oriented x3, no distress Head: no evidence of trauma, PERRL, EOMI, no exophtalmos or lid lag, no myxedema, no xanthelasma; normal ears, nose and  oropharynx Neck: normal jugular venous pulsations and no hepatojugular reflux; carotid pulses with delay and bilateral carotid bruits Chest: clear to auscultation, no signs of consolidation by percussion or palpation, normal fremitus, symmetrical and full respiratory excursions Cardiovascular: normal position and quality of the apical impulse, regular rhythm, normal first and severely diminished second heart sound, no rubs or gallops, 3/6 late peaking AS murmur Abdomen: no tenderness or distention, no masses by palpation, no abnormal pulsatility or arterial bruits, normal bowel sounds, no hepatosplenomegaly Extremities: no clubbing, cyanosis or edema; 2+ radial, ulnar and brachial pulses bilaterally; 2+ right femoral, posterior tibial and dorsalis pedis pulses; 2+ left femoral, posterior tibial and dorsalis pedis pulses; no subclavian or femoral bruits Neurological: grossly nonfocal  LABS  CBC  Recent Labs  07/28/15 0600 07/29/15 1741  WBC 11.5* 11.8*  HGB 7.8* 8.3*  HCT 24.1* 25.2*  MCV 96.8 96.6  PLT 258 [id number]   Basic Metabolic Panel  Recent Labs  07/29/15 1740 07/30/15 0307  NA 133* 134*  K 5.4* 3.9  CL 93* 91*  CO2 19* 27  GLUCOSE 168* 121*  BUN 89* 47*  CREATININE 10.47* 7.20*  CALCIUM 8.9  8.7*  PHOS 9.8*  --    Liver Function Tests  Recent Labs  07/29/15 0402 07/29/15 1740 07/30/15 0307  AST 966*  --  358*  ALT 1813*  --  1199*  ALKPHOS 147*  --  134*  BILITOT 1.6*  --  1.5*  PROT 6.7  --  6.0*  ALBUMIN 3.2* 3.0* 2.9*    TELE NSR  ASSESSMENT AND PLAN  1. Critical AS 2. Acute on chronic diastolic HF 3. CAD with 100% chronic RCA 4. ESRD on HDF 5. Acute hepatitis, likely due to hypotension/ischemia - improving  Suspect marked elevation in transaminases was due to low cardiac output and hypotension. He is very sensitive to fluid shifts due to the severity of the AS. I think he is as close to euvolemia today as we will get him. Try to keep around  current weight. TAVR Tuesday 08/02/15.   Sanda Klein, MD, [facility] [phone] office [phone] pager 07/30/2015 11:53 AM

## 2015-07-30 NOTE — Progress Notes (Signed)
[facility] ROUNDING NOTE   Subjective:   Interval History: scheduled for valve replacement on 9/6   Objective:  Vital signs in last 24 hours:  Temp:  [97 F (36.1 C)-97.7 F (36.5 C)] 97 F (36.1 C) (09/03 0500) Pulse Rate:  [75-86] 75 (09/03 0500) Resp:  [16-23] 18 (09/03 0500) BP: (92-117)/(43-73) 97/51 mmHg (09/03 0500) SpO2:  [95 %-100 %] 98 % (09/03 0500) Weight:  [92.08 kg (203 lb)-94.3 kg (207 lb 14.3 oz)] 92.08 kg (203 lb) (09/03 0500)  Weight change: 0.1 kg (3.5 oz) Filed Weights   07/29/15 1630 07/29/15 2045 07/30/15 0500  Weight: 94.3 kg (207 lb 14.3 oz) 92.3 kg (203 lb 7.8 oz) 92.08 kg (203 lb)    Intake/Output: I/O last 3 completed shifts: In: [id number] [P.O.:762; IV Piggyback:110] Out: 2005 [Other:2005]   Intake/Output this shift:      General: Pt is alert, follows commands appropriately, not in acute distress  HEENT: No icterus, No thrush, No neck mass, [place]/AT  Cardiovascular: RRR, S1/S2, no rubs, no gallops  Respiratory: Bibasilar crackles. No wheezing. Good air movement.  Abdomen: Soft/+BS, non tender, non distended, no guarding  Extremities: 1+LE edema, No lymphangitis, No petechiae, No rashes, no synovitis   Basic Metabolic Panel:  Recent Labs Lab 07/25/15 1948 07/27/15 0345 07/28/15 0600 07/29/15 0402 07/29/15 1740 07/30/15 0307  NA 132* 132* 135 132* 133* 134*  K 5.4* 7.2* 5.2* 6.2* 5.4* 3.9  CL 92* 90* 95* 92* 93* 91*  CO2 23 16* 24 22 19* 27  GLUCOSE 176* 172* 117* 168* 168* 121*  BUN 64* 84* 58* 80* 89* 47*  CREATININE 9.52* 11.84* 8.81* 10.21* 10.47* 7.20*  CALCIUM 9.3 9.2 8.7* 8.9 8.9 8.7*  PHOS 6.8*  --   --   --  9.8*  --     Liver Function Tests:  Recent Labs Lab 07/27/15 0345 07/28/15 0600 07/29/15 0402 07/29/15 1740 07/30/15 0307  AST 2886* 2549* 966*  --  358*  ALT 1562* 2332* 1813*  --  1199*  ALKPHOS 113 126 147*  --  134*  BILITOT 1.6* 1.3* 1.6*  --  1.5*  PROT 6.9 6.2* 6.7  --  6.0*   ALBUMIN 3.2* 3.0* 3.2* 3.0* 2.9*    Recent Labs Lab 07/27/15 0841  LIPASE 27   No results for input(s): AMMONIA in the last 168 hours.  CBC:  Recent Labs Lab 07/27/15 0345 07/28/15 0600 07/29/15 1741  WBC 13.3* 11.5* 11.8*  HGB 8.2* 7.8* 8.3*  HCT 25.8* 24.1* 25.2*  MCV 95.6 96.8 96.6  PLT 358 258 229    Cardiac Enzymes: No results for input(s): CKTOTAL, CKMB, CKMBINDEX, TROPONINI in the last 168 hours.  BNP: Invalid input(s): POCBNP  CBG:  Recent Labs Lab 07/29/15 1139 07/29/15 1601 07/29/15 2139 07/30/15 0605 07/30/15 0726  GLUCAP 154* 165* 122* 106* 118*    Microbiology: Results for orders placed or performed during the hospital encounter of 07/18/15  Culture, blood (routine x 2)     Status: None (Preliminary result)   Collection Time: 07/27/15  8:30 AM  Result Value Ref Range Status   Specimen Description BLOOD  Final   Special Requests   Final    BOTTLES DRAWN AEROBIC AND ANAEROBIC L AVF RED PORT 10ML   Culture NO GROWTH 2 DAYS  Final   Report Status PENDING  Incomplete  Culture, blood (routine x 2)     Status: None (Preliminary result)   Collection Time: 07/27/15  8:45 AM  Result Value Ref Range Status   Specimen Description BLOOD  Final   Special Requests   Final    BOTTLES DRAWN AEROBIC AND ANAEROBIC L AVF REDPORT 10 ML   Culture NO GROWTH 2 DAYS  Final   Report Status PENDING  Incomplete    Coagulation Studies:  Recent Labs  07/29/15 0402 07/30/15 0307  LABPROT 22.3* 20.2*  INR 1.96* 1.73*    Urinalysis: No results for input(s): COLORURINE, LABSPEC, PHURINE, GLUCOSEU, HGBUR, BILIRUBINUR, KETONESUR, PROTEINUR, UROBILINOGEN, NITRITE, LEUKOCYTESUR in the last 72 hours.  Invalid input(s): APPERANCEUR    Imaging: No results found.   Medications:     . antiseptic oral rinse  7 mL Mouth Rinse BID  . aspirin  325 mg Oral Daily  . calcium carbonate  3 tablet Oral TID WC  . darbepoetin (ARANESP) injection - DIALYSIS  100 mcg  Intravenous Q Mon-HD  . ferric gluconate (FERRLECIT/NULECIT) IV  62.5 mg Intravenous Q Fri-HD  . heparin  5,000 Units Subcutaneous 3 times per day  . insulin aspart  0-9 Units Subcutaneous TID WC  . metoprolol tartrate  12.5 mg Oral BID  . midodrine  10 mg Oral TID WC  . multivitamin  1 tablet Oral QHS  . ondansetron  4 mg Intravenous 3 times per day  . pantoprazole  40 mg Oral Q0600   acetaminophen **OR** [DISCONTINUED] acetaminophen, albuterol, calcium carbonate, HYDROcodone-acetaminophen, HYDROcodone-homatropine, metoprolol, nitroGLYCERIN, [DISCONTINUED] ondansetron **OR** ondansetron (ZOFRAN) IV  Assessment/ Plan:   ESRD-  MWF dialysis K 3.9  ANEMIA-  Stable 8.3    MBD- stable   HTN/VOL- controlled   ACCESS- fistula  OTHER- AVR planned for Tuesday 9/6      LOS: 12 Jeffrey [name] W @TODAY @10 :28 AM

## 2015-07-30 NOTE — Procedures (Signed)
Pt did not want to be placed on BiPAP at this time.  RT instructed pt to call when ready to be placed on CPAP.  RN also informed.

## 2015-07-30 NOTE — Care Management (Signed)
Parameter in system to notify MD if DBP <43mmhg. VS 1344 105/49 pulse 71. S/W Dr. Carles Collet on unit, MD aware, no new orders received

## 2015-07-30 NOTE — Progress Notes (Signed)
PROGRESS NOTE  Jeffrey [name] [id number] DOB: 1941/03/17 DOA: 07/18/2015 PCP: Thurman Coyer, MD   Brief History 74 year old male with history of diabetes mellitus, coronary artery disease, aortic stenosis, paroxysmal atrial fibrillation, symptomatic bradycardia status post permanent pacemaker on 07/11/2015, hypertension, hyperlipidemia, stroke, ESRD presented with increasing dyspnea, chest discomfort, and cough. Chest x-ray revealed pulmonary edema with bilateral pleural effusions suggesting fluid overload. Nephrology was consulted and the patient was taken to dialysis due to acute on chronic diastolic CHF worsened by severe aortic stenosis. He's been seen by cardiology and nephrology, dialysis for fluid removal. The patient underwent left heart catheterization 07/22/2015 which showed 100% mid RCA, 50% mid LAD. He is tentatively scheduled for TAVR on 08/02/15 Assessment/Plan: Acute respiratory failure with hypoxia due to acute on chronic diastolic CHF with underlying severe aortic stenosis -Dialysis for fluid removal,  -appreciate cardiology and renal followup - blood pressure borderline--continue Midodrine -Supportive care with oxygen, nebulizer treatment  -Metoprolol tartrate as tolerated by blood pressure. -Presently stable on 6 L nasal cannula Acute on chronic diastolic CHF -[id number] echocardiogram EF 55-60%, severe aortic stenosis, inferior and apical HK -HD to help with fluid management -Continue metoprolol tartrate as BP tolerates -Severe aortic stenosis contributing -daily weights -07/29/15--HD---normally MWF -pt has PPM for complete heart block  Transaminasemia/Ischemic Hepatitis -due to transient episodes of hypotension and hepatic congestion from CHF -trend LFTs--tending down -07/18/2015 hepatitis B surface antigen negative  -Check hepatitis C antibody--neg -Check lipase--27  -Abdominal ultrasound--s/p cholecystectomy, no liver lesions -Ehrlichia  antibodies--neg -repeatRMSF serologies--no increase in IgG or IgM -8/25--received doxy 200mg  x 1 -appreciate GI opinion  Coagulopathy -secondary to liver dysfunction  -restart daily vitamin K -recheck INR -follow  Severe aortic stenosis.  -appreciate cardiology -TAVR planned 08/02/15  Intermitten/Recurrent nausea -likely due to low flow state from CHF and hypotension -prn zofran  Hyperkalemia -Patient was hyperkalemic again today--Kayexalate given in the early morning -07/29/2015 dialysis -Repeat BMP in the morning   Chest pain in a setting of established coronary artery disease/Pericarditis Likely due to pericarditis-->conservative management per cardiology,  -continue aspirin and Plavix. -Elevated troponin due to demand ischemia in the setting of ESRD and decompensated CHF.  -L heart Cath on 07-22-15--100% mid RCA, 50% mid LAD  Paroxysmal atrial fibrillation [place] VASC 2 score - 2.  -going in and out of atrial fibrillation during this admission -on aspirin and Plavix.  -Per cardiology poor candidate for anticoagulation. -rate controlled  Incidental L Adrenal and R Kidney lesions CT scan ordered by cardiology - follow with PCP age appropriate outpatient workup.   Hypertension - BP currently soft, low-dose beta blocker if tolerated.   Hyperlipidemia - on home dose statin   Tic removal night of 07/19/2015. Unknown duration of tick exposure, given prophylaxis with doxycycline 200 mg 1 dose  Diabetes mellitus type 2  -04/27/2015 hemoglobin A1c 6.7  -CBG controlled without Levemir at this time  -NovoLog sliding scale   Family Communication: Daughter updated at beside Disposition Plan: Remain in hospital through 08/02/15   Procedures/Studies: Dg Chest 2 View  07/27/2015   CLINICAL DATA:  Pneumonia, CHF, chest pain and shortness of breath, but not today  EXAM: CHEST  2 VIEW  COMPARISON:  07/25/2015  FINDINGS: Bilateral interstitial thickening with  prominence of the central pulmonary vasculature bilateral small pleural effusions. No pneumothorax. Stable cardiomegaly. Thoracic aortic atherosclerosis. No acute osseous abnormality. Dual lead cardiac pacemaker.  IMPRESSION: Mild CHF.   Electronically Signed   By:  Kathreen Devoid   On: 07/27/2015 17:36   Dg Chest 2 View  07/18/2015   CLINICAL DATA:  Cough, chest pain and shortness of breath tonight. Recent pacemaker placement.  EXAM: CHEST  2 VIEW  COMPARISON:  07/12/2015  FINDINGS: Dual lead right-sided pacemaker, leads intact. No pneumothorax. Cardiomegaly is unchanged. Increased perivascular haziness suggestive of pulmonary edema. Probable bilateral pleural effusions with blunting of the costophrenic angles and associated bibasilar atelectasis. No acute osseous abnormalities are seen.  IMPRESSION: Small pleural effusions and perivascular haziness suggestive of pulmonary edema. Recommend correlation for CHF.   Electronically Signed   By: Jeb Levering M.D.   On: 07/18/2015 02:08   Dg Chest 2 View  07/12/2015   CLINICAL DATA:  Status post pacemaker placement, low right shoulder pain  EXAM: CHEST - 2 VIEW  COMPARISON:  04/27/2015  FINDINGS: Cardiac shadow is stable. Lungs are well aerated bilaterally. No focal infiltrate or sizable effusion is noted. Mild central vascular congestion is noted without interstitial edema. A pacing device is now seen. No pneumothorax is noted. No bony abnormality is seen.  IMPRESSION: Mild vascular congestion.  Status post pacemaker without pneumothorax.   Electronically Signed   By: Inez Catalina M.D.   On: 07/12/2015 07:41   US Abdomen Complete  07/27/2015   CLINICAL DATA:  Mid abdominal pain, elevated LFTs  EXAM: ULTRASOUND ABDOMEN COMPLETE  COMPARISON:  07/21/2015  FINDINGS: Gallbladder: Surgically absent  Common bile duct: Diameter: 5.5 mm  Liver: No focal lesion identified. Within normal limits in parenchymal echogenicity.  IVC: No abnormality visualized.  Pancreas:  Obscured by bowel gas  Spleen: Size and appearance within normal limits.  Right Kidney: Length: 9.7 cm.  9.6 cm  Left Kidney: Length: 3 cm simple cyst lower pole. Echogenicity within normal limits. No mass or hydronephrosis visualized.  Abdominal aorta: Largely obscured by bowel gas  Other findings: Bilateral pleural effusions  IMPRESSION: Study is somewhat limited. No acute findings identified other than bilateral pleural effusions.   Electronically Signed   By: Skipper Cliche M.D.   On: 07/27/2015 12:42   Ct Coronary Morp W/cta Cor W/score W/ca W/cm &/or Wo/cm  07/25/2015   ADDENDUM REPORT: 07/25/2015 19:05  CLINICAL DATA:  Aortic stenosis  EXAM: Cardiac TAVR CT  TECHNIQUE: The patient was scanned on a Philips 256 scanner. A 120 kV retrospective scan was triggered in the descending thoracic aorta at 111 HU's. Gantry rotation speed was 270 msecs and collimation was .9 mm. 5 mg of iv Metoprolol or nitro were given. The 3D data set was reconstructed in 5% intervals of the R-R cycle. Systolic and diastolic phases were analyzed on a dedicated work station using MPR, MIP and VRT modes. The patient received 80 cc of contrast.  FINDINGS: Aortic Valve: Trileaflet. Severely thickened and calcified with severely restricted leaflet opening.  Aorta: Minimal diffuse calcifications. Normal caliber. No dissection.  Sinotubular Junction:  36 x 33 mm  Ascending Thoracic Aorta:  38 x 36 mm  Aortic Arch:  Not visualized  Descending Thoracic Aorta:  29 x 28 mm  Sinus of Valsalva Measurements:  Non-coronary:  37 mm  Right -coronary:  38 mm  Left -coronary:  40 mm  Coronary Artery Height above Annulus:  Left Main:  12 mm  Right Coronary:  16 mm  Virtual Basal Annulus Measurements:  Maximum/Minimum Diameter:  28 x 23 mm  Perimeter:  97 mm  Area:  521 mm2  Optimum Fluoroscopic Angle for Delivery:  RAO 2 CRA  2  IMPRESSION: 1. Severely thickened and calcified aortic valve with severely restricted leaflet opening and annular measurements  suitable for deployment of 26 mm Edward-SAPIEN 3 valve.  2.  Sufficient annulus to coronary distance.  3.  Optimum Fluoroscopic Angle for Delivery:  RAO 2 CRA 2.  Ena Dawley   Electronically Signed   By: Ena Dawley   On: 07/25/2015 19:05   07/25/2015   EXAM: OVER-READ INTERPRETATION  CT CHEST  The following report is an over-read performed by radiologist Dr. Fonnie Birkenhead [facility] on 07/25/2015. This over-read does not include interpretation of cardiac or coronary anatomy or pathology. The coronary calcium score/coronary CTA interpretation by the cardiologist is attached.  COMPARISON:  07/21/2015.  FINDINGS: Pulmonary arteries are rather enlarged. Low right paratracheal lymph nodes measure up to 11 mm, likely reactive. Visualized portions of the lungs show septal thickening with a focal area of new consolidation in the subpleural lingula. Bilateral effusions and compressive atelectasis in both lower lobes, incompletely imaged.  IMPRESSION: 1. Congestive heart failure. 2. New infectious or inflammatory consolidation in the lingula. 3. Pulmonary arterial hypertension.  Electronically Signed: By: Lorin Picket M.D. On: 07/25/2015 15:19   Dg Abd Portable 1v  07/26/2015   CLINICAL DATA:  Nausea  EXAM: PORTABLE ABDOMEN - 1 VIEW  COMPARISON:  Abdominal CT from 5 days ago  FINDINGS: Small bilateral kidneys with high-density appearance from IV contrast yesterday. Contrast is seen in the urinary bladder, including large diverticulum. Nonobstructive bowel gas pattern. No concerning intra-abdominal mass effect. Cholecystectomy.  IMPRESSION: 1. Nonobstructive bowel gas pattern. 2. Large bladder diverticulum.   Electronically Signed   By: Monte Fantasia M.D.   On: 07/26/2015 08:51   Ct Angio Chest Aorta W/cm &/or Wo/cm  07/23/2015   CLINICAL DATA:  74 year old male with history of severe aortic stenosis. Preprocedural study prior to potential transcatheter aortic valve replacement (TAVR).   EXAM: CT ANGIOGRAPHY CHEST, ABDOMEN AND PELVIS  TECHNIQUE: Multidetector CT imaging through the chest, abdomen and pelvis was performed using the standard protocol during bolus administration of intravenous contrast. Multiplanar reconstructed images and MIPs were obtained and reviewed to evaluate the vascular anatomy.  CONTRAST:  114mL OMNIPAQUE IOHEXOL 350 MG/ML SOLN  COMPARISON:  No priors.  FINDINGS: CTA CHEST FINDINGS  Mediastinum/Lymph Nodes: Heart size is enlarged. There is no significant pericardial fluid, thickening or pericardial calcification. There is atherosclerosis of the thoracic aorta, the great vessels of the mediastinum and the coronary arteries, including calcified atherosclerotic plaque in the left main, left anterior descending, left circumflex and right coronary arteries. Severe thickening and calcification of the aortic valve. Calcifications of the mitral valve and mitral annulus. 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 Dilatation of the pulmonic trunk (4.8 cm in diameter), suggestive of pulmonary arterial hypertension. Multiple borderline enlarged mediastinal lymph nodes measuring up to 9 mm in short axis. No definite pathologically enlarged mediastinal or hilar lymph nodes. Esophagus is unremarkable in appearance. No axillary lymphadenopathy.  Lungs/Pleura: Moderate bilateral pleural effusions layering dependently with extensive passive atelectasis in the dependent portions of the lower lobes of the lungs bilaterally. Mild diffuse ground-glass attenuation and interlobular septal thickening in the lungs suggestive of a background of mild interstitial pulmonary edema. No acute consolidative airspace disease. No suspicious appearing pulmonary nodule or mass.  Musculoskeletal/Soft Tissues: There are no aggressive appearing lytic or blastic lesions noted in the visualized portions of the skeleton.  CTA ABDOMEN  AND PELVIS FINDINGS  Hepatobiliary: No cystic or solid hepatic lesions. No intra or extrahepatic biliary ductal dilatation. Status post cholecystectomy.  Pancreas: No pancreatic mass. No pancreatic ductal dilatation. No pancreatic or peripancreatic fluid or inflammatory changes.  Spleen: Unremarkable.  Adrenals/Urinary Tract: 2.2 cm left adrenal nodule is indeterminate. Right adrenal gland is normal in appearance. Exophytic 3.5 cm simple cyst in the posterior aspect of the interpolar region of the left kidney. In the lateral interpolar region of the right kidney there is a 1.7 cm high attenuation (74 HU) lesion which could represent an enhancing lesion or a proteinaceous/hemorrhagic cyst. No hydroureteronephrosis. Urinary bladder is remarkable for a 5 cm diverticulum in the posterior aspect of the urinary bladder on the left side.  Stomach/Bowel: The appearance of the stomach is normal. Several diverticulae of the duodenum are noted. No surrounding inflammatory changes to suggest associated duodenal diverticulitis. No pathologic dilatation of small bowel or colon. Numerous colonic diverticulae are noted, without surrounding inflammatory changes to suggest an acute diverticulitis at this time. Normal appendix.  Vascular/Lymphatic: Vascular findings and measurements pertinent to potential TAVR procedure, as detailed below. No aneurysm or dissection identified. No lymphadenopathy noted in the abdomen or pelvis.  Reproductive: Prostate gland and seminal vesicles are unremarkable in appearance.  Other: Small bilateral inguinal hernias containing only fat incidentally noted. No significant volume of ascites. No pneumoperitoneum.  Musculoskeletal: Chronic appearing compression fracture of superior endplate of L1 with approximately 15% loss of anterior vertebral body height. There are no aggressive appearing lytic or blastic lesions noted in the visualized portions of the skeleton.  VASCULAR MEASUREMENTS PERTINENT TO TAVR:   AORTA:  Minimal Aortic Diameter -  16 x 16 mm  Severity of Aortic Calcification -  severe  RIGHT PELVIS:  Right Common Iliac Artery -  Minimal Diameter - 9.5 x 8.1 mm  Tortuosity - mild  Calcification - moderate  Right External Iliac Artery -  Minimal Diameter - 8.7 x 6.9 mm  Tortuosity - mild  Calcification - mild  Right Common Femoral Artery -  Minimal Diameter - Proximal 9.0 x 7.8 mm; Distal 8.6 x 3.6 mm  Tortuosity - mild  Calcification - moderate proximally, severe distally  LEFT PELVIS:  Left Common Iliac Artery -  Minimal Diameter - 11.6 x 10.0 mm  Tortuosity - mild  Calcification - moderate  Left External Iliac Artery -  Minimal Diameter - 8.8 x 7.8 mm  Tortuosity - mild  Calcification - mild  Left Common Femoral Artery -  Minimal Diameter - Proximal 7.0 x 6.1 mm; Distal 6.5 x 5.8 mm  Tortuosity - mild  Calcification - moderate proximally, severe distally  Review of the MIP images confirms the above findings.  IMPRESSION: 1. Vascular findings and measurements pertinent to potential TAVR procedure, as detailed above. This patient does appear to have suitable pelvic arterial access bilaterally, particularly if vascular access is achieved in the proximal common femoral arteries (distal common femoral arteries are not suitable for vascular access). 2. Severe thickening calcifications of the aortic valve, compatible with the reported clinical history of aortic stenosis. 3. Severe dilatation of the pulmonic trunk and main pulmonary arteries, suggestive of pulmonary arterial hypertension. 4. Cardiomegaly with evidence of mild interstitial pulmonary edema and moderate bilateral pleural effusions, suggesting congestive heart failure. Numerous borderline enlarged mediastinal lymph nodes are likely secondary to underlying congestive heart failure. 5. 2.2 cm left adrenal nodule is indeterminate. Statistically, this is likely to represent a small adenoma, and could be further characterized  with nonemergent noncontrast CT  the abdomen. 6. 1.7 cm high attenuation lesion in the interpolar region of the right kidney may represent either a proteinaceous/hemorrhagic cyst or a solid enhancing lesion. Ideally, this would be characterized by MRI, however, given the patient's pacemaker, further evaluation with nonemergent renal ultrasound is strongly recommended at this time. Alternatively, if this lesion is of similar high attenuation on the followup noncontrast CT the abdomen (recommended for number 6 above), no further characterization may be needed. 7. Additional incidental findings, as above.   Electronically Signed   By: Vinnie Langton M.D.   On: 07/23/2015 16:01   Ct Angio Abd/pel W/ And/or W/o  07/23/2015   CLINICAL DATA:  74 year old male with history of severe aortic stenosis. Preprocedural study prior to potential transcatheter aortic valve replacement (TAVR).  EXAM: CT ANGIOGRAPHY CHEST, ABDOMEN AND PELVIS  TECHNIQUE: Multidetector CT imaging through the chest, abdomen and pelvis was performed using the standard protocol during bolus administration of intravenous contrast. Multiplanar reconstructed images and MIPs were obtained and reviewed to evaluate the vascular anatomy.  CONTRAST:  177mL OMNIPAQUE IOHEXOL 350 MG/ML SOLN  COMPARISON:  No priors.  FINDINGS: CTA CHEST FINDINGS  Mediastinum/Lymph Nodes: Heart size is enlarged. There is no significant pericardial fluid, thickening or pericardial calcification. There is atherosclerosis of the thoracic aorta, the great vessels of the mediastinum and the coronary arteries, including calcified atherosclerotic plaque in the left main, left anterior descending, left circumflex and right coronary arteries. Severe thickening and calcification of the aortic valve. Calcifications of the mitral valve and mitral annulus. 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 Dilatation of the pulmonic trunk (4.8 cm in  diameter), suggestive of pulmonary arterial hypertension. Multiple borderline enlarged mediastinal lymph nodes measuring up to 9 mm in short axis. No definite pathologically enlarged mediastinal or hilar lymph nodes. Esophagus is unremarkable in appearance. No axillary lymphadenopathy.  Lungs/Pleura: Moderate bilateral pleural effusions layering dependently with extensive passive atelectasis in the dependent portions of the lower lobes of the lungs bilaterally. Mild diffuse ground-glass attenuation and interlobular septal thickening in the lungs suggestive of a background of mild interstitial pulmonary edema. No acute consolidative airspace disease. No suspicious appearing pulmonary nodule or mass.  Musculoskeletal/Soft Tissues: There are no aggressive appearing lytic or blastic lesions noted in the visualized portions of the skeleton.  CTA ABDOMEN AND PELVIS FINDINGS  Hepatobiliary: No cystic or solid hepatic lesions. No intra or extrahepatic biliary ductal dilatation. Status post cholecystectomy.  Pancreas: No pancreatic mass. No pancreatic ductal dilatation. No pancreatic or peripancreatic fluid or inflammatory changes.  Spleen: Unremarkable.  Adrenals/Urinary Tract: 2.2 cm left adrenal nodule is indeterminate. Right adrenal gland is normal in appearance. Exophytic 3.5 cm simple cyst in the posterior aspect of the interpolar region of the left kidney. In the lateral interpolar region of the right kidney there is a 1.7 cm high attenuation (74 HU) lesion which could represent an enhancing lesion or a proteinaceous/hemorrhagic cyst. No hydroureteronephrosis. Urinary bladder is remarkable for a 5 cm diverticulum in the posterior aspect of the urinary bladder on the left side.  Stomach/Bowel: The appearance of the stomach is normal. Several diverticulae of the duodenum are noted. No surrounding inflammatory changes to suggest associated duodenal diverticulitis. No pathologic dilatation of small bowel or colon.  Numerous colonic diverticulae are noted, without surrounding inflammatory changes to suggest an acute diverticulitis at this time. Normal appendix.  Vascular/Lymphatic: Vascular findings and measurements pertinent to  potential TAVR procedure, as detailed below. No aneurysm or dissection identified. No lymphadenopathy noted in the abdomen or pelvis.  Reproductive: Prostate gland and seminal vesicles are unremarkable in appearance.  Other: Small bilateral inguinal hernias containing only fat incidentally noted. No significant volume of ascites. No pneumoperitoneum.  Musculoskeletal: Chronic appearing compression fracture of superior endplate of L1 with approximately 15% loss of anterior vertebral body height. There are no aggressive appearing lytic or blastic lesions noted in the visualized portions of the skeleton.  VASCULAR MEASUREMENTS PERTINENT TO TAVR:  AORTA:  Minimal Aortic Diameter -  16 x 16 mm  Severity of Aortic Calcification -  severe  RIGHT PELVIS:  Right Common Iliac Artery -  Minimal Diameter - 9.5 x 8.1 mm  Tortuosity - mild  Calcification - moderate  Right External Iliac Artery -  Minimal Diameter - 8.7 x 6.9 mm  Tortuosity - mild  Calcification - mild  Right Common Femoral Artery -  Minimal Diameter - Proximal 9.0 x 7.8 mm; Distal 8.6 x 3.6 mm  Tortuosity - mild  Calcification - moderate proximally, severe distally  LEFT PELVIS:  Left Common Iliac Artery -  Minimal Diameter - 11.6 x 10.0 mm  Tortuosity - mild  Calcification - moderate  Left External Iliac Artery -  Minimal Diameter - 8.8 x 7.8 mm  Tortuosity - mild  Calcification - mild  Left Common Femoral Artery -  Minimal Diameter - Proximal 7.0 x 6.1 mm; Distal 6.5 x 5.8 mm  Tortuosity - mild  Calcification - moderate proximally, severe distally  Review of the MIP images confirms the above findings.  IMPRESSION: 1. Vascular findings and measurements pertinent to potential TAVR procedure, as detailed above. This patient does appear to have  suitable pelvic arterial access bilaterally, particularly if vascular access is achieved in the proximal common femoral arteries (distal common femoral arteries are not suitable for vascular access). 2. Severe thickening calcifications of the aortic valve, compatible with the reported clinical history of aortic stenosis. 3. Severe dilatation of the pulmonic trunk and main pulmonary arteries, suggestive of pulmonary arterial hypertension. 4. Cardiomegaly with evidence of mild interstitial pulmonary edema and moderate bilateral pleural effusions, suggesting congestive heart failure. Numerous borderline enlarged mediastinal lymph nodes are likely secondary to underlying congestive heart failure. 5. 2.2 cm left adrenal nodule is indeterminate. Statistically, this is likely to represent a small adenoma, and could be further characterized with nonemergent noncontrast CT the abdomen. 6. 1.7 cm high attenuation lesion in the interpolar region of the right kidney may represent either a proteinaceous/hemorrhagic cyst or a solid enhancing lesion. Ideally, this would be characterized by MRI, however, given the patient's pacemaker, further evaluation with nonemergent renal ultrasound is strongly recommended at this time. Alternatively, if this lesion is of similar high attenuation on the followup noncontrast CT the abdomen (recommended for number 6 above), no further characterization may be needed. 7. Additional incidental findings, as above.   Electronically Signed   By: Vinnie Langton M.D.   On: 07/23/2015 16:01         Subjective: Patient complains of persistent nausea. Denies any fevers, chills, chest discomfort, shortness breath, vomiting, diarrhea, abdominal pain, dysuria, hematuria. No hematochezia or melena.  Objective: Filed Vitals:   07/30/15 0500 07/30/15 0948 07/30/15 1057 07/30/15 1344  BP: 97/51 100/48 110/58 105/49  Pulse: 75 77 75 71  Temp: 97 F (36.1 C)   97.2 F (36.2 C)  TempSrc: Oral    Oral  Resp: 18   18  Height:      Weight: 92.08 kg (203 lb)     SpO2: 98%   95%    Intake/Output Summary (Last 24 hours) at 07/30/15 1511 Last data filed at 07/30/15 1100  Gross per 24 hour  Intake    680 ml  Output   2005 ml  Net  -1325 ml   Weight change: 0.1 kg (3.5 oz) Exam:   General:  Pt is alert, follows commands appropriately, not in acute distress  HEENT: No icterus, No thrush, No neck mass, [place]/AT  Cardiovascular: RRR, S1/S2, no rubs, no gallops  Respiratory: Bibasilar crackles. No wheeze. Good air movement  Abdomen: Soft/+BS, non tender, non distended, no guarding  Extremities: No edema, No lymphangitis, No petechiae, No rashes, no synovitis  Data Reviewed: Basic Metabolic Panel:  Recent Labs Lab 07/25/15 1948 07/27/15 0345 07/28/15 0600 07/29/15 0402 07/29/15 1740 07/30/15 0307  NA 132* 132* 135 132* 133* 134*  K 5.4* 7.2* 5.2* 6.2* 5.4* 3.9  CL 92* 90* 95* 92* 93* 91*  CO2 23 16* 24 22 19* 27  GLUCOSE 176* 172* 117* 168* 168* 121*  BUN 64* 84* 58* 80* 89* 47*  CREATININE 9.52* 11.84* 8.81* 10.21* 10.47* 7.20*  CALCIUM 9.3 9.2 8.7* 8.9 8.9 8.7*  PHOS 6.8*  --   --   --  9.8*  --    Liver Function Tests:  Recent Labs Lab 07/27/15 0345 07/28/15 0600 07/29/15 0402 07/29/15 1740 07/30/15 0307  AST 2886* 2549* 966*  --  358*  ALT 1562* 2332* 1813*  --  1199*  ALKPHOS 113 126 147*  --  134*  BILITOT 1.6* 1.3* 1.6*  --  1.5*  PROT 6.9 6.2* 6.7  --  6.0*  ALBUMIN 3.2* 3.0* 3.2* 3.0* 2.9*    Recent Labs Lab 07/27/15 0841  LIPASE 27   No results for input(s): AMMONIA in the last 168 hours. CBC:  Recent Labs Lab 07/27/15 0345 07/28/15 0600 07/29/15 1741  WBC 13.3* 11.5* 11.8*  HGB 8.2* 7.8* 8.3*  HCT 25.8* 24.1* 25.2*  MCV 95.6 96.8 96.6  PLT 358 258 229   Cardiac Enzymes: No results for input(s): CKTOTAL, CKMB, CKMBINDEX, TROPONINI in the last 168 hours. BNP: Invalid input(s): POCBNP CBG:  Recent Labs Lab 07/29/15 1601  07/29/15 2139 07/30/15 0605 07/30/15 0726 07/30/15 1119  GLUCAP 165* 122* 106* 118* 172*    Recent Results (from the past 240 hour(s))  Culture, blood (routine x 2)     Status: None (Preliminary result)   Collection Time: 07/27/15  8:30 AM  Result Value Ref Range Status   Specimen Description BLOOD  Final   Special Requests   Final    BOTTLES DRAWN AEROBIC AND ANAEROBIC L AVF RED PORT 10ML   Culture NO GROWTH 3 DAYS  Final   Report Status PENDING  Incomplete  Culture, blood (routine x 2)     Status: None (Preliminary result)   Collection Time: 07/27/15  8:45 AM  Result Value Ref Range Status   Specimen Description BLOOD  Final   Special Requests   Final    BOTTLES DRAWN AEROBIC AND ANAEROBIC L AVF REDPORT 10 ML   Culture NO GROWTH 3 DAYS  Final   Report Status PENDING  Incomplete     Scheduled Meds: . antiseptic oral rinse  7 mL Mouth Rinse BID  . aspirin  325 mg Oral Daily  . calcium carbonate  3 tablet Oral TID WC  . darbepoetin (ARANESP) injection - DIALYSIS  100 mcg Intravenous Q Mon-HD  . ferric gluconate (FERRLECIT/NULECIT) IV  62.5 mg Intravenous Q Fri-HD  . heparin  5,000 Units Subcutaneous 3 times per day  . insulin aspart  0-9 Units Subcutaneous TID WC  . metoprolol tartrate  12.5 mg Oral BID  . midodrine  10 mg Oral TID WC  . multivitamin  1 tablet Oral QHS  . pantoprazole  40 mg Oral Q0600  . phytonadione  10 mg Oral Daily   Continuous Infusions:    Jawara Latorre, DO  [facility] Pager [phone]  If 7PM-7AM, please contact night-coverage [email or web address] Password [email or web address] 07/30/2015, 3:11 PM   LOS: 12 days

## 2015-07-31 LAB — PROTIME-INR
INR: 1.55 — ABNORMAL HIGH (ref 0.00–1.49)
Prothrombin Time: 18.6 seconds — ABNORMAL HIGH (ref 11.6–15.2)

## 2015-07-31 LAB — GLUCOSE, CAPILLARY
GLUCOSE-CAPILLARY: 133 mg/dL — AB (ref 65–99)
GLUCOSE-CAPILLARY: 146 mg/dL — AB (ref 65–99)
GLUCOSE-CAPILLARY: 181 mg/dL — AB (ref 65–99)
Glucose-Capillary: 174 mg/dL — ABNORMAL HIGH (ref 65–99)

## 2015-07-31 NOTE — Progress Notes (Signed)
[facility] ROUNDING NOTE   Subjective:   Interval History:scheduled for TAVR on 9/6  Objective:  Vital signs in last 24 hours:  Temp:  [97.2 F (36.2 C)-97.9 F (36.6 C)] 97.9 F (36.6 C) (09/04 0848) Pulse Rate:  [68-103] 77 (09/04 0848) Resp:  [16-20] 18 (09/04 0848) BP: (97-109)/(49-64) 109/62 mmHg (09/04 0848) SpO2:  [91 %-100 %] 97 % (09/04 0848) Weight:  [92.352 kg (203 lb 9.6 oz)] 92.352 kg (203 lb 9.6 oz) (09/04 0444)  Weight change: -1.948 kg (-4 lb 4.7 oz) Filed Weights   07/29/15 2045 07/30/15 0500 07/31/15 0444  Weight: 92.3 kg (203 lb 7.8 oz) 92.08 kg (203 lb) 92.352 kg (203 lb 9.6 oz)    Intake/Output: I/O last 3 completed shifts: In: 680 [P.O.:680] Out: 2005 [Other:2005]   Intake/Output this shift:  Total I/O In: 415 [P.O.:415] Out: 0   CVS- RRR RS- CTA  Some diminished air entry on right  ABD- BS present soft non-distended EXT-  1 + edema   Basic Metabolic Panel:  Recent Labs Lab 07/25/15 1948 07/27/15 0345 07/28/15 0600 07/29/15 0402 07/29/15 1740 07/30/15 0307  NA 132* 132* 135 132* 133* 134*  K 5.4* 7.2* 5.2* 6.2* 5.4* 3.9  CL 92* 90* 95* 92* 93* 91*  CO2 23 16* 24 22 19* 27  GLUCOSE 176* 172* 117* 168* 168* 121*  BUN 64* 84* 58* 80* 89* 47*  CREATININE 9.52* 11.84* 8.81* 10.21* 10.47* 7.20*  CALCIUM 9.3 9.2 8.7* 8.9 8.9 8.7*  PHOS 6.8*  --   --   --  9.8*  --     Liver Function Tests:  Recent Labs Lab 07/27/15 0345 07/28/15 0600 07/29/15 0402 07/29/15 1740 07/30/15 0307  AST 2886* 2549* 966*  --  358*  ALT 1562* 2332* 1813*  --  1199*  ALKPHOS 113 126 147*  --  134*  BILITOT 1.6* 1.3* 1.6*  --  1.5*  PROT 6.9 6.2* 6.7  --  6.0*  ALBUMIN 3.2* 3.0* 3.2* 3.0* 2.9*    Recent Labs Lab 07/27/15 0841  LIPASE 27   No results for input(s): AMMONIA in the last 168 hours.  CBC:  Recent Labs Lab 07/27/15 0345 07/28/15 0600 07/29/15 1741  WBC 13.3* 11.5* 11.8*  HGB 8.2* 7.8* 8.3*  HCT 25.8* 24.1*  25.2*  MCV 95.6 96.8 96.6  PLT 358 258 229    Cardiac Enzymes: No results for input(s): CKTOTAL, CKMB, CKMBINDEX, TROPONINI in the last 168 hours.  BNP: Invalid input(s): POCBNP  CBG:  Recent Labs Lab 07/30/15 0726 07/30/15 1119 07/30/15 1615 07/30/15 2133 07/31/15 0640  GLUCAP 118* 172* 148* 177* 133*    Microbiology: Results for orders placed or performed during the hospital encounter of 07/18/15  Culture, blood (routine x 2)     Status: None (Preliminary result)   Collection Time: 07/27/15  8:30 AM  Result Value Ref Range Status   Specimen Description BLOOD  Final   Special Requests   Final    BOTTLES DRAWN AEROBIC AND ANAEROBIC L AVF RED PORT 10ML   Culture NO GROWTH 3 DAYS  Final   Report Status PENDING  Incomplete  Culture, blood (routine x 2)     Status: None (Preliminary result)   Collection Time: 07/27/15  8:45 AM  Result Value Ref Range Status   Specimen Description BLOOD  Final   Special Requests   Final    BOTTLES DRAWN AEROBIC AND ANAEROBIC L AVF REDPORT 10 ML   Culture NO  GROWTH 3 DAYS  Final   Report Status PENDING  Incomplete    Coagulation Studies:  Recent Labs  07/29/15 0402 07/30/15 0307 07/31/15 0400  LABPROT 22.3* 20.2* 18.6*  INR 1.96* 1.73* 1.55*    Urinalysis: No results for input(s): COLORURINE, LABSPEC, PHURINE, GLUCOSEU, HGBUR, BILIRUBINUR, KETONESUR, PROTEINUR, UROBILINOGEN, NITRITE, LEUKOCYTESUR in the last 72 hours.  Invalid input(s): APPERANCEUR    Imaging: No results found.   Medications:     . antiseptic oral rinse  7 mL Mouth Rinse BID  . aspirin  325 mg Oral Daily  . calcium carbonate  3 tablet Oral TID WC  . darbepoetin (ARANESP) injection - DIALYSIS  100 mcg Intravenous Q Mon-HD  . ferric gluconate (FERRLECIT/NULECIT) IV  62.5 mg Intravenous Q Fri-HD  . heparin  5,000 Units Subcutaneous 3 times per day  . insulin aspart  0-9 Units Subcutaneous TID WC  . metoprolol tartrate  12.5 mg Oral BID  . midodrine   10 mg Oral TID WC  . multivitamin  1 tablet Oral QHS  . pantoprazole  40 mg Oral Q0600  . phytonadione  10 mg Oral Daily   acetaminophen **OR** [DISCONTINUED] acetaminophen, albuterol, calcium carbonate, HYDROcodone-acetaminophen, HYDROcodone-homatropine, metoprolol, nitroGLYCERIN, [DISCONTINUED] ondansetron **OR** ondansetron (ZOFRAN) IV  Assessment/ Plan:  Incidental L Adrenal and R Kidney lesions CT scan ordered by cardiology - follow with PCP age appropriate outpatient workup.   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

## 2015-07-31 NOTE — Progress Notes (Signed)
PROGRESS NOTE  CALVERT POLSON [id number] DOB: Apr 09, 1941 DOA: 07/18/2015 PCP: Thurman Coyer, MD  Brief History 74 year old male with history of diabetes mellitus, coronary artery disease, aortic stenosis, paroxysmal atrial fibrillation, symptomatic bradycardia status post permanent pacemaker on 07/11/2015, hypertension, hyperlipidemia, stroke, ESRD presented with increasing dyspnea, chest discomfort, and cough. Chest x-ray revealed pulmonary edema with bilateral pleural effusions suggesting fluid overload. Nephrology was consulted and the patient was taken to dialysis due to acute on chronic diastolic CHF worsened by severe aortic stenosis. He's been seen by cardiology and nephrology, dialysis for fluid removal. The patient underwent left heart catheterization 07/22/2015 which showed 100% mid RCA, 50% mid LAD. He is tentatively scheduled for TAVR on 08/02/15. Patient's overriding complaint is that of nausea with intermittent emesis. This is thought to be due to his low flow state and resultant possible gastropathy as well as effects of midodrine which was d/ced on 9/4. Assessment/Plan: Acute respiratory failure with hypoxia due to acute on chronic diastolic CHF with underlying severe aortic stenosis -Dialysis for fluid removal,  -appreciate cardiology and renal followup - blood pressure borderline--continue Midodrine -Supportive care with oxygen, nebulizer treatment  -Metoprolol tartrate as tolerated by blood pressure. -Presently stable on 6 L nasal cannula Acute on chronic diastolic CHF -[id number] echocardiogram EF 55-60%, severe aortic stenosis, inferior and apical HK -HD to help with fluid management -Continue metoprolol tartrate as BP tolerates -Severe aortic stenosis contributing -daily weights -07/29/15--HD---normally MWF -pt has PPM for complete heart block  Transaminasemia/Ischemic Hepatitis -due to transient episodes of hypotension and hepatic congestion from  CHF -trend LFTs--trending down -07/18/2015 hepatitis B surface antigen negative  -Check hepatitis C antibody--neg -Check lipase--27  -Abdominal ultrasound--s/p cholecystectomy, no liver lesions -Ehrlichia antibodies--neg -repeatRMSF serologies--no increase in IgG or IgM -8/25--received doxy 200mg  x 1 -appreciate GI opinion  Coagulopathy -secondary to liver dysfunction  -restart daily vitamin K -recheck INR -follow  Severe aortic stenosis.  -appreciate cardiology -TAVR planned 08/02/15  Intermitten/Recurrent nausea -likely due to low flow state from CHF and hypotension -midodrine d/ced by cardiology -prn zofran  Hyperkalemia -Patient was hyperkalemic again today--Kayexalate given in the early morning -07/29/2015 dialysis -resolved  Chest pain in a setting of established coronary artery disease/Pericarditis Likely due to pericarditis-->conservative management per cardiology,  -continue aspirin and Plavix. -Elevated troponin due to demand ischemia in the setting of ESRD and decompensated CHF.  -L heart Cath on 07-22-15--100% mid RCA, 50% mid LAD  Paroxysmal atrial fibrillation [place] VASC 2 score - 2.  -going in and out of atrial fibrillation during this admission -on aspirin  -plavix on hold due for surgery; last dose on 07/28/15  -Per cardiology poor candidate for anticoagulation. -rate controlled  Incidental L Adrenal and R Kidney lesions CT scan ordered by cardiology - follow with PCP age appropriate outpatient workup.   Hypertension - BP currently soft, low-dose beta blocker if tolerated.   Hyperlipidemia - on home dose statin   Tic removal night of 07/19/2015. Unknown duration of tick exposure, given prophylaxis with doxycycline 200 mg 1 dose  Diabetes mellitus type 2  -04/27/2015 hemoglobin A1c 6.7  -CBG controlled without Levemir at this time  -NovoLog sliding scale   Family Communication: Daughter updated at beside Disposition Plan: Remain  in hospital through 08/02/15    Procedures/Studies: Dg Chest 2 View  07/27/2015   CLINICAL DATA:  Pneumonia, CHF, chest pain and shortness of breath, but not today  EXAM: CHEST  2 VIEW  COMPARISON:  07/25/2015  FINDINGS: Bilateral interstitial thickening with prominence of the central pulmonary vasculature bilateral small pleural effusions. No pneumothorax. Stable cardiomegaly. Thoracic aortic atherosclerosis. No acute osseous abnormality. Dual lead cardiac pacemaker.  IMPRESSION: Mild CHF.   Electronically Signed   By: Kathreen Devoid   On: 07/27/2015 17:36   Dg Chest 2 View  07/18/2015   CLINICAL DATA:  Cough, chest pain and shortness of breath tonight. Recent pacemaker placement.  EXAM: CHEST  2 VIEW  COMPARISON:  07/12/2015  FINDINGS: Dual lead right-sided pacemaker, leads intact. No pneumothorax. Cardiomegaly is unchanged. Increased perivascular haziness suggestive of pulmonary edema. Probable bilateral pleural effusions with blunting of the costophrenic angles and associated bibasilar atelectasis. No acute osseous abnormalities are seen.  IMPRESSION: Small pleural effusions and perivascular haziness suggestive of pulmonary edema. Recommend correlation for CHF.   Electronically Signed   By: Jeb Levering M.D.   On: 07/18/2015 02:08   Dg Chest 2 View  07/12/2015   CLINICAL DATA:  Status post pacemaker placement, low right shoulder pain  EXAM: CHEST - 2 VIEW  COMPARISON:  04/27/2015  FINDINGS: Cardiac shadow is stable. Lungs are well aerated bilaterally. No focal infiltrate or sizable effusion is noted. Mild central vascular congestion is noted without interstitial edema. A pacing device is now seen. No pneumothorax is noted. No bony abnormality is seen.  IMPRESSION: Mild vascular congestion.  Status post pacemaker without pneumothorax.   Electronically Signed   By: Inez Catalina M.D.   On: 07/12/2015 07:41   US Abdomen Complete  07/27/2015   CLINICAL DATA:  Mid abdominal pain, elevated LFTs  EXAM:  ULTRASOUND ABDOMEN COMPLETE  COMPARISON:  07/21/2015  FINDINGS: Gallbladder: Surgically absent  Common bile duct: Diameter: 5.5 mm  Liver: No focal lesion identified. Within normal limits in parenchymal echogenicity.  IVC: No abnormality visualized.  Pancreas: Obscured by bowel gas  Spleen: Size and appearance within normal limits.  Right Kidney: Length: 9.7 cm.  9.6 cm  Left Kidney: Length: 3 cm simple cyst lower pole. Echogenicity within normal limits. No mass or hydronephrosis visualized.  Abdominal aorta: Largely obscured by bowel gas  Other findings: Bilateral pleural effusions  IMPRESSION: Study is somewhat limited. No acute findings identified other than bilateral pleural effusions.   Electronically Signed   By: Skipper Cliche M.D.   On: 07/27/2015 12:42   Ct Coronary Morp W/cta Cor W/score W/ca W/cm &/or Wo/cm  07/25/2015   ADDENDUM REPORT: 07/25/2015 19:05  CLINICAL DATA:  Aortic stenosis  EXAM: Cardiac TAVR CT  TECHNIQUE: The patient was scanned on a Philips 256 scanner. A 120 kV retrospective scan was triggered in the descending thoracic aorta at 111 HU's. Gantry rotation speed was 270 msecs and collimation was .9 mm. 5 mg of iv Metoprolol or nitro were given. The 3D data set was reconstructed in 5% intervals of the R-R cycle. Systolic and diastolic phases were analyzed on a dedicated work station using MPR, MIP and VRT modes. The patient received 80 cc of contrast.  FINDINGS: Aortic Valve: Trileaflet. Severely thickened and calcified with severely restricted leaflet opening.  Aorta: Minimal diffuse calcifications. Normal caliber. No dissection.  Sinotubular Junction:  36 x 33 mm  Ascending Thoracic Aorta:  38 x 36 mm  Aortic Arch:  Not visualized  Descending Thoracic Aorta:  29 x 28 mm  Sinus of Valsalva Measurements:  Non-coronary:  37 mm  Right -coronary:  38 mm  Left -coronary:  40 mm  Coronary Artery Height above Annulus:  Left  Main:  12 mm  Right Coronary:  16 mm  Virtual Basal Annulus  Measurements:  Maximum/Minimum Diameter:  28 x 23 mm  Perimeter:  97 mm  Area:  521 mm2  Optimum Fluoroscopic Angle for Delivery:  RAO 2 CRA 2  IMPRESSION: 1. Severely thickened and calcified aortic valve with severely restricted leaflet opening and annular measurements suitable for deployment of 26 mm Edward-SAPIEN 3 valve.  2.  Sufficient annulus to coronary distance.  3.  Optimum Fluoroscopic Angle for Delivery:  RAO 2 CRA 2.  Ena Dawley   Electronically Signed   By: Ena Dawley   On: 07/25/2015 19:05   07/25/2015   EXAM: OVER-READ INTERPRETATION  CT CHEST  The following report is an over-read performed by radiologist Dr. Fonnie Birkenhead [facility] on 07/25/2015. This over-read does not include interpretation of cardiac or coronary anatomy or pathology. The coronary calcium score/coronary CTA interpretation by the cardiologist is attached.  COMPARISON:  07/21/2015.  FINDINGS: Pulmonary arteries are rather enlarged. Low right paratracheal lymph nodes measure up to 11 mm, likely reactive. Visualized portions of the lungs show septal thickening with a focal area of new consolidation in the subpleural lingula. Bilateral effusions and compressive atelectasis in both lower lobes, incompletely imaged.  IMPRESSION: 1. Congestive heart failure. 2. New infectious or inflammatory consolidation in the lingula. 3. Pulmonary arterial hypertension.  Electronically Signed: By: Lorin Picket M.D. On: 07/25/2015 15:19   Dg Abd Portable 1v  07/26/2015   CLINICAL DATA:  Nausea  EXAM: PORTABLE ABDOMEN - 1 VIEW  COMPARISON:  Abdominal CT from 5 days ago  FINDINGS: Small bilateral kidneys with high-density appearance from IV contrast yesterday. Contrast is seen in the urinary bladder, including large diverticulum. Nonobstructive bowel gas pattern. No concerning intra-abdominal mass effect. Cholecystectomy.  IMPRESSION: 1. Nonobstructive bowel gas pattern. 2. Large bladder diverticulum.   Electronically  Signed   By: Monte Fantasia M.D.   On: 07/26/2015 08:51   Ct Angio Chest Aorta W/cm &/or Wo/cm  07/23/2015   CLINICAL DATA:  74 year old male with history of severe aortic stenosis. Preprocedural study prior to potential transcatheter aortic valve replacement (TAVR).  EXAM: CT ANGIOGRAPHY CHEST, ABDOMEN AND PELVIS  TECHNIQUE: Multidetector CT imaging through the chest, abdomen and pelvis was performed using the standard protocol during bolus administration of intravenous contrast. Multiplanar reconstructed images and MIPs were obtained and reviewed to evaluate the vascular anatomy.  CONTRAST:  160mL OMNIPAQUE IOHEXOL 350 MG/ML SOLN  COMPARISON:  No priors.  FINDINGS: CTA CHEST FINDINGS  Mediastinum/Lymph Nodes: Heart size is enlarged. There is no significant pericardial fluid, thickening or pericardial calcification. There is atherosclerosis of the thoracic aorta, the great vessels of the mediastinum and the coronary arteries, including calcified atherosclerotic plaque in the left main, left anterior descending, left circumflex and right coronary arteries. Severe thickening and calcification of the aortic valve. Calcifications of the mitral valve and mitral annulus. 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 Dilatation of the pulmonic trunk (4.8 cm in diameter), suggestive of pulmonary arterial hypertension. Multiple borderline enlarged mediastinal lymph nodes measuring up to 9 mm in short axis. No definite pathologically enlarged mediastinal or hilar lymph nodes. Esophagus is unremarkable in appearance. No axillary lymphadenopathy.  Lungs/Pleura: Moderate bilateral pleural effusions layering dependently with extensive passive atelectasis in the dependent portions of the lower lobes of the lungs bilaterally. Mild diffuse ground-glass attenuation and interlobular septal thickening in the lungs suggestive  of a background of mild  interstitial pulmonary edema. No acute consolidative airspace disease. No suspicious appearing pulmonary nodule or mass.  Musculoskeletal/Soft Tissues: There are no aggressive appearing lytic or blastic lesions noted in the visualized portions of the skeleton.  CTA ABDOMEN AND PELVIS FINDINGS  Hepatobiliary: No cystic or solid hepatic lesions. No intra or extrahepatic biliary ductal dilatation. Status post cholecystectomy.  Pancreas: No pancreatic mass. No pancreatic ductal dilatation. No pancreatic or peripancreatic fluid or inflammatory changes.  Spleen: Unremarkable.  Adrenals/Urinary Tract: 2.2 cm left adrenal nodule is indeterminate. Right adrenal gland is normal in appearance. Exophytic 3.5 cm simple cyst in the posterior aspect of the interpolar region of the left kidney. In the lateral interpolar region of the right kidney there is a 1.7 cm high attenuation (74 HU) lesion which could represent an enhancing lesion or a proteinaceous/hemorrhagic cyst. No hydroureteronephrosis. Urinary bladder is remarkable for a 5 cm diverticulum in the posterior aspect of the urinary bladder on the left side.  Stomach/Bowel: The appearance of the stomach is normal. Several diverticulae of the duodenum are noted. No surrounding inflammatory changes to suggest associated duodenal diverticulitis. No pathologic dilatation of small bowel or colon. Numerous colonic diverticulae are noted, without surrounding inflammatory changes to suggest an acute diverticulitis at this time. Normal appendix.  Vascular/Lymphatic: Vascular findings and measurements pertinent to potential TAVR procedure, as detailed below. No aneurysm or dissection identified. No lymphadenopathy noted in the abdomen or pelvis.  Reproductive: Prostate gland and seminal vesicles are unremarkable in appearance.  Other: Small bilateral inguinal hernias containing only fat incidentally noted. No significant volume of ascites. No pneumoperitoneum.  Musculoskeletal:  Chronic appearing compression fracture of superior endplate of L1 with approximately 15% loss of anterior vertebral body height. There are no aggressive appearing lytic or blastic lesions noted in the visualized portions of the skeleton.  VASCULAR MEASUREMENTS PERTINENT TO TAVR:  AORTA:  Minimal Aortic Diameter -  16 x 16 mm  Severity of Aortic Calcification -  severe  RIGHT PELVIS:  Right Common Iliac Artery -  Minimal Diameter - 9.5 x 8.1 mm  Tortuosity - mild  Calcification - moderate  Right External Iliac Artery -  Minimal Diameter - 8.7 x 6.9 mm  Tortuosity - mild  Calcification - mild  Right Common Femoral Artery -  Minimal Diameter - Proximal 9.0 x 7.8 mm; Distal 8.6 x 3.6 mm  Tortuosity - mild  Calcification - moderate proximally, severe distally  LEFT PELVIS:  Left Common Iliac Artery -  Minimal Diameter - 11.6 x 10.0 mm  Tortuosity - mild  Calcification - moderate  Left External Iliac Artery -  Minimal Diameter - 8.8 x 7.8 mm  Tortuosity - mild  Calcification - mild  Left Common Femoral Artery -  Minimal Diameter - Proximal 7.0 x 6.1 mm; Distal 6.5 x 5.8 mm  Tortuosity - mild  Calcification - moderate proximally, severe distally  Review of the MIP images confirms the above findings.  IMPRESSION: 1. Vascular findings and measurements pertinent to potential TAVR procedure, as detailed above. This patient does appear to have suitable pelvic arterial access bilaterally, particularly if vascular access is achieved in the proximal common femoral arteries (distal common femoral arteries are not suitable for vascular access). 2. Severe thickening calcifications of the aortic valve, compatible with the reported clinical history of aortic stenosis. 3. Severe dilatation of the pulmonic trunk and main pulmonary arteries, suggestive of pulmonary arterial hypertension. 4. Cardiomegaly with evidence of mild interstitial pulmonary edema and  moderate bilateral pleural effusions, suggesting congestive heart failure.  Numerous borderline enlarged mediastinal lymph nodes are likely secondary to underlying congestive heart failure. 5. 2.2 cm left adrenal nodule is indeterminate. Statistically, this is likely to represent a small adenoma, and could be further characterized with nonemergent noncontrast CT the abdomen. 6. 1.7 cm high attenuation lesion in the interpolar region of the right kidney may represent either a proteinaceous/hemorrhagic cyst or a solid enhancing lesion. Ideally, this would be characterized by MRI, however, given the patient's pacemaker, further evaluation with nonemergent renal ultrasound is strongly recommended at this time. Alternatively, if this lesion is of similar high attenuation on the followup noncontrast CT the abdomen (recommended for number 6 above), no further characterization may be needed. 7. Additional incidental findings, as above.   Electronically Signed   By: Vinnie Langton M.D.   On: 07/23/2015 16:01   Ct Angio Abd/pel W/ And/or W/o  07/23/2015   CLINICAL DATA:  74 year old male with history of severe aortic stenosis. Preprocedural study prior to potential transcatheter aortic valve replacement (TAVR).  EXAM: CT ANGIOGRAPHY CHEST, ABDOMEN AND PELVIS  TECHNIQUE: Multidetector CT imaging through the chest, abdomen and pelvis was performed using the standard protocol during bolus administration of intravenous contrast. Multiplanar reconstructed images and MIPs were obtained and reviewed to evaluate the vascular anatomy.  CONTRAST:  12mL OMNIPAQUE IOHEXOL 350 MG/ML SOLN  COMPARISON:  No priors.  FINDINGS: CTA CHEST FINDINGS  Mediastinum/Lymph Nodes: Heart size is enlarged. There is no significant pericardial fluid, thickening or pericardial calcification. There is atherosclerosis of the thoracic aorta, the great vessels of the mediastinum and the coronary arteries, including calcified atherosclerotic plaque in the left main, left anterior descending, left circumflex and right coronary  arteries. Severe thickening and calcification of the aortic valve. Calcifications of the mitral valve and mitral annulus. 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 Dilatation of the pulmonic trunk (4.8 cm in diameter), suggestive of pulmonary arterial hypertension. Multiple borderline enlarged mediastinal lymph nodes measuring up to 9 mm in short axis. No definite pathologically enlarged mediastinal or hilar lymph nodes. Esophagus is unremarkable in appearance. No axillary lymphadenopathy.  Lungs/Pleura: Moderate bilateral pleural effusions layering dependently with extensive passive atelectasis in the dependent portions of the lower lobes of the lungs bilaterally. Mild diffuse ground-glass attenuation and interlobular septal thickening in the lungs suggestive of a background of mild interstitial pulmonary edema. No acute consolidative airspace disease. No suspicious appearing pulmonary nodule or mass.  Musculoskeletal/Soft Tissues: There are no aggressive appearing lytic or blastic lesions noted in the visualized portions of the skeleton.  CTA ABDOMEN AND PELVIS FINDINGS  Hepatobiliary: No cystic or solid hepatic lesions. No intra or extrahepatic biliary ductal dilatation. Status post cholecystectomy.  Pancreas: No pancreatic mass. No pancreatic ductal dilatation. No pancreatic or peripancreatic fluid or inflammatory changes.  Spleen: Unremarkable.  Adrenals/Urinary Tract: 2.2 cm left adrenal nodule is indeterminate. Right adrenal gland is normal in appearance. Exophytic 3.5 cm simple cyst in the posterior aspect of the interpolar region of the left kidney. In the lateral interpolar region of the right kidney there is a 1.7 cm high attenuation (74 HU) lesion which could represent an enhancing lesion or a proteinaceous/hemorrhagic cyst. No hydroureteronephrosis. Urinary bladder is remarkable for a 5 cm diverticulum in the posterior  aspect of the urinary bladder on the left side.  Stomach/Bowel: The appearance of the stomach is normal. Several diverticulae of the duodenum are noted.  No surrounding inflammatory changes to suggest associated duodenal diverticulitis. No pathologic dilatation of small bowel or colon. Numerous colonic diverticulae are noted, without surrounding inflammatory changes to suggest an acute diverticulitis at this time. Normal appendix.  Vascular/Lymphatic: Vascular findings and measurements pertinent to potential TAVR procedure, as detailed below. No aneurysm or dissection identified. No lymphadenopathy noted in the abdomen or pelvis.  Reproductive: Prostate gland and seminal vesicles are unremarkable in appearance.  Other: Small bilateral inguinal hernias containing only fat incidentally noted. No significant volume of ascites. No pneumoperitoneum.  Musculoskeletal: Chronic appearing compression fracture of superior endplate of L1 with approximately 15% loss of anterior vertebral body height. There are no aggressive appearing lytic or blastic lesions noted in the visualized portions of the skeleton.  VASCULAR MEASUREMENTS PERTINENT TO TAVR:  AORTA:  Minimal Aortic Diameter -  16 x 16 mm  Severity of Aortic Calcification -  severe  RIGHT PELVIS:  Right Common Iliac Artery -  Minimal Diameter - 9.5 x 8.1 mm  Tortuosity - mild  Calcification - moderate  Right External Iliac Artery -  Minimal Diameter - 8.7 x 6.9 mm  Tortuosity - mild  Calcification - mild  Right Common Femoral Artery -  Minimal Diameter - Proximal 9.0 x 7.8 mm; Distal 8.6 x 3.6 mm  Tortuosity - mild  Calcification - moderate proximally, severe distally  LEFT PELVIS:  Left Common Iliac Artery -  Minimal Diameter - 11.6 x 10.0 mm  Tortuosity - mild  Calcification - moderate  Left External Iliac Artery -  Minimal Diameter - 8.8 x 7.8 mm  Tortuosity - mild  Calcification - mild  Left Common Femoral Artery -  Minimal Diameter - Proximal 7.0 x 6.1 mm; Distal 6.5  x 5.8 mm  Tortuosity - mild  Calcification - moderate proximally, severe distally  Review of the MIP images confirms the above findings.  IMPRESSION: 1. Vascular findings and measurements pertinent to potential TAVR procedure, as detailed above. This patient does appear to have suitable pelvic arterial access bilaterally, particularly if vascular access is achieved in the proximal common femoral arteries (distal common femoral arteries are not suitable for vascular access). 2. Severe thickening calcifications of the aortic valve, compatible with the reported clinical history of aortic stenosis. 3. Severe dilatation of the pulmonic trunk and main pulmonary arteries, suggestive of pulmonary arterial hypertension. 4. Cardiomegaly with evidence of mild interstitial pulmonary edema and moderate bilateral pleural effusions, suggesting congestive heart failure. Numerous borderline enlarged mediastinal lymph nodes are likely secondary to underlying congestive heart failure. 5. 2.2 cm left adrenal nodule is indeterminate. Statistically, this is likely to represent a small adenoma, and could be further characterized with nonemergent noncontrast CT the abdomen. 6. 1.7 cm high attenuation lesion in the interpolar region of the right kidney may represent either a proteinaceous/hemorrhagic cyst or a solid enhancing lesion. Ideally, this would be characterized by MRI, however, given the patient's pacemaker, further evaluation with nonemergent renal ultrasound is strongly recommended at this time. Alternatively, if this lesion is of similar high attenuation on the followup noncontrast CT the abdomen (recommended for number 6 above), no further characterization may be needed. 7. Additional incidental findings, as above.   Electronically Signed   By: Vinnie Langton M.D.   On: 07/23/2015 16:01         Subjective: Patient continues to complain of intermittent nausea without emesis. Denies any headache, fevers, chills, chest  pain, shortness breath, vomiting, diarrhea, dysuria.  Objective: Filed Vitals:   07/30/15 2134  07/31/15 0444 07/31/15 0848 07/31/15 1408  BP: 100/64 101/56 109/62 107/62  Pulse: 103 77 77 74  Temp: 97.8 F (36.6 C) 97.5 F (36.4 C) 97.9 F (36.6 C) 97.9 F (36.6 C)  TempSrc: Oral Oral Oral Oral  Resp: 18 20 18 18   Height:      Weight:  92.352 kg (203 lb 9.6 oz)    SpO2: 91% 100% 97% 95%    Intake/Output Summary (Last 24 hours) at 07/31/15 1607 Last data filed at 07/31/15 1000  Gross per 24 hour  Intake    415 ml  Output      0 ml  Net    415 ml   Weight change: -1.948 kg (-4 lb 4.7 oz) Exam:   General:  Pt is alert, follows commands appropriately, not in acute distress  HEENT: No icterus, No thrush, No neck mass, [place]/AT  Cardiovascular: RRR, S1/S2, no rubs, no gallops  Respiratory: Bibasilar crackles. No wheeze. Good air movement.  Abdomen: Soft/+BS, non tender, non distended, no guarding  Extremities: No edema, No lymphangitis, No petechiae, No rashes, no synovitis  Data Reviewed: Basic Metabolic Panel:  Recent Labs Lab 07/25/15 1948 07/27/15 0345 07/28/15 0600 07/29/15 0402 07/29/15 1740 07/30/15 0307  NA 132* 132* 135 132* 133* 134*  K 5.4* 7.2* 5.2* 6.2* 5.4* 3.9  CL 92* 90* 95* 92* 93* 91*  CO2 23 16* 24 22 19* 27  GLUCOSE 176* 172* 117* 168* 168* 121*  BUN 64* 84* 58* 80* 89* 47*  CREATININE 9.52* 11.84* 8.81* 10.21* 10.47* 7.20*  CALCIUM 9.3 9.2 8.7* 8.9 8.9 8.7*  PHOS 6.8*  --   --   --  9.8*  --    Liver Function Tests:  Recent Labs Lab 07/27/15 0345 07/28/15 0600 07/29/15 0402 07/29/15 1740 07/30/15 0307  AST 2886* 2549* 966*  --  358*  ALT 1562* 2332* 1813*  --  1199*  ALKPHOS 113 126 147*  --  134*  BILITOT 1.6* 1.3* 1.6*  --  1.5*  PROT 6.9 6.2* 6.7  --  6.0*  ALBUMIN 3.2* 3.0* 3.2* 3.0* 2.9*    Recent Labs Lab 07/27/15 0841  LIPASE 27   No results for input(s): AMMONIA in the last 168 hours. CBC:  Recent Labs Lab  07/27/15 0345 07/28/15 0600 07/29/15 1741  WBC 13.3* 11.5* 11.8*  HGB 8.2* 7.8* 8.3*  HCT 25.8* 24.1* 25.2*  MCV 95.6 96.8 96.6  PLT 358 258 229   Cardiac Enzymes: No results for input(s): CKTOTAL, CKMB, CKMBINDEX, TROPONINI in the last 168 hours. BNP: Invalid input(s): POCBNP CBG:  Recent Labs Lab 07/30/15 1119 07/30/15 1615 07/30/15 2133 07/31/15 0640 07/31/15 1108  GLUCAP 172* 148* 177* 133* 174*    Recent Results (from the past 240 hour(s))  Culture, blood (routine x 2)     Status: None (Preliminary result)   Collection Time: 07/27/15  8:30 AM  Result Value Ref Range Status   Specimen Description BLOOD  Final   Special Requests   Final    BOTTLES DRAWN AEROBIC AND ANAEROBIC L AVF RED PORT 10ML   Culture NO GROWTH 4 DAYS  Final   Report Status PENDING  Incomplete  Culture, blood (routine x 2)     Status: None (Preliminary result)   Collection Time: 07/27/15  8:45 AM  Result Value Ref Range Status   Specimen Description BLOOD  Final   Special Requests   Final    BOTTLES DRAWN AEROBIC AND ANAEROBIC L AVF REDPORT 10  ML   Culture NO GROWTH 4 DAYS  Final   Report Status PENDING  Incomplete     Scheduled Meds: . antiseptic oral rinse  7 mL Mouth Rinse BID  . aspirin  325 mg Oral Daily  . calcium carbonate  3 tablet Oral TID WC  . darbepoetin (ARANESP) injection - DIALYSIS  100 mcg Intravenous Q Mon-HD  . ferric gluconate (FERRLECIT/NULECIT) IV  62.5 mg Intravenous Q Fri-HD  . heparin  5,000 Units Subcutaneous 3 times per day  . insulin aspart  0-9 Units Subcutaneous TID WC  . metoprolol tartrate  12.5 mg Oral BID  . multivitamin  1 tablet Oral QHS  . pantoprazole  40 mg Oral Q0600  . phytonadione  10 mg Oral Daily   Continuous Infusions:    TAT, DAVID, DO  [facility] Pager [phone]  If 7PM-7AM, please contact night-coverage [email or web address] Password [email or web address] 07/31/2015, 4:07 PM   LOS: 13 days

## 2015-07-31 NOTE — Progress Notes (Signed)
Rt removed bipap per pt.  Pt says he will not wear again until after surgery on Tuesday.  Rt will continue to monitor.

## 2015-07-31 NOTE — Progress Notes (Signed)
Patient Name: Jeffrey [name] Date of Encounter: 07/31/2015  Principal Problem:   Acute respiratory failure Active Problems:   DM (diabetes mellitus), type 2   HLD (hyperlipidemia)   CAD (coronary artery disease)   Chest pain   Fluid overload   ESRD on hemodialysis   Acute on chronic diastolic CHF (congestive heart failure)   Severe aortic stenosis   Hypervolemia   Aortic stenosis   Hyperkalemia   Primary Cardiologist: Dr Caryl Comes, Dr Burt Knack  Patient Profile: 74 yo male w/ hx CAD, AS, PAF, PPM, DM, HTN, HL, CVA, ESRD on HD, was admitted 08/22 w/ acute resp failure. Cards following, plan is for TAVR 09/06. Dry weight decreased w/ HD and LFTs were abnl (but had not been checked since 12/2014).    SUBJECTIVE: Was having a great day until he got 2 pills (midodrine 5 mg x 2). About 10" later, became nauseated and vomited them up.  OBJECTIVE Filed Vitals:   07/30/15 1629 07/30/15 2134 07/31/15 0444 07/31/15 0848  BP: 97/51 100/64 101/56 109/62  Pulse: 68 103 77 77  Temp: 97.7 F (36.5 C) 97.8 F (36.6 C) 97.5 F (36.4 C) 97.9 F (36.6 C)  TempSrc: Oral Oral Oral Oral  Resp: 16 18 20 18   Height:      Weight:   203 lb 9.6 oz (92.352 kg)   SpO2: 94% 91% 100% 97%    Intake/Output Summary (Last 24 hours) at 07/31/15 1145 Last data filed at 07/31/15 1000  Gross per 24 hour  Intake    415 ml  Output      0 ml  Net    415 ml   Filed Weights   07/29/15 2045 07/30/15 0500 07/31/15 0444  Weight: 203 lb 7.8 oz (92.3 kg) 203 lb (92.08 kg) 203 lb 9.6 oz (92.352 kg)    PHYSICAL EXAM General: Well developed, well nourished, male in no acute distress. Head: Normocephalic, atraumatic.  Neck: Supple without bruits, JVD 10 cm Lungs:  Resp regular and unlabored, decreased BS bases, few rales. Heart: RRR, S1, S2, no S3, S4, 2/6 murmur; no rub. Abdomen: Soft, non-tender, non-distended, BS + x 4.  Extremities: No clubbing, cyanosis, edema.  Neuro: Alert and oriented X 3. Moves all  extremities spontaneously. Psych: Normal affect.  LABS: CBC: Recent Labs  07/29/15 1741  WBC 11.8*  HGB 8.3*  HCT 25.2*  MCV 96.6  PLT 229   INR: Recent Labs  07/31/15 0400  INR [id number]*   Basic Metabolic Panel: Recent Labs  07/29/15 1740 07/30/15 0307  NA 133* 134*  K 5.4* 3.9  CL 93* 91*  CO2 19* 27  GLUCOSE 168* 121*  BUN 89* 47*  CREATININE 10.47* 7.20*  CALCIUM 8.9 8.7*  PHOS 9.8*  --    Liver Function Tests: Recent Labs  07/29/15 0402 07/29/15 1740 07/30/15 0307  AST 966*  --  358*  ALT 1813*  --  1199*  ALKPHOS 147*  --  134*  BILITOT 1.6*  --  1.5*  PROT 6.7  --  6.0*  ALBUMIN 3.2* 3.0* 2.9*   BNP:  B NATRIURETIC PEPTIDE  Date/Time Value Ref Range Status  07/18/2015 02:09 AM 2856.0* 0.0 - 100.0 pg/mL Final  01/11/2015 10:48 AM 679.8* 0.0 - 100.0 pg/mL Final    TELE:   Atrial fib, V pacing     Current Medications:  . antiseptic oral rinse  7 mL Mouth Rinse BID  . aspirin  325 mg Oral Daily  .  calcium carbonate  3 tablet Oral TID WC  . darbepoetin (ARANESP) injection - DIALYSIS  100 mcg Intravenous Q Mon-HD  . ferric gluconate (FERRLECIT/NULECIT) IV  62.5 mg Intravenous Q Fri-HD  . heparin  5,000 Units Subcutaneous 3 times per day  . insulin aspart  0-9 Units Subcutaneous TID WC  . metoprolol tartrate  12.5 mg Oral BID  . midodrine  10 mg Oral TID WC  . multivitamin  1 tablet Oral QHS  . pantoprazole  40 mg Oral Q0600  . phytonadione  10 mg Oral Daily      ASSESSMENT AND PLAN: Principal Problem:   Acute respiratory failure - volume management w/ HD    ESRD on hemodialysis  - per Nephrology and IM - d/c'd midodrine as it was scheduled, which will hopefully help the N&V - OK to give as PRN for HD if GI symptoms will allow, will leave to Nephrology to manage  Otherwise, per IM/TCTS/Nephrology Active Problems:   DM (diabetes mellitus), type 2   HLD (hyperlipidemia)   CAD (coronary artery disease)   Chest pain   Fluid overload    Acute on chronic diastolic CHF (congestive heart failure)   Severe aortic stenosis   Hypervolemia   Aortic stenosis   Hyperkalemia   Signed, Barrett, Rhonda , PA-C 11:45 AM 07/31/2015  I have seen and examined the patient along with Barrett, Rhonda , PA-C.  I have reviewed the chart, notes and new data.  I agree with PA's note.  Key new complaints: nausea is dominant complaint Key examination changes: no overt hypervolemia and no severe hypotension Key new findings / data: transaminases continue to improve  PLAN: DC midodrine except for immediately pre-dialysis. Midodrine active metabolite (80% cleared renally) may be accumulating (it is cleared by HD, so give only pre dialysis and only if necessary). Reevaluate in AM and after HD tomorrow to see if ready for TAVR  Sanda Klein, MD, [place] [phone] 07/31/2015, 12:58 PM

## 2015-08-01 LAB — RENAL FUNCTION PANEL
ALBUMIN: 2.9 g/dL — AB (ref 3.5–5.0)
Anion gap: 19 — ABNORMAL HIGH (ref 5–15)
BUN: 81 mg/dL — AB (ref 6–20)
CO2: 21 mmol/L — ABNORMAL LOW (ref 22–32)
CREATININE: 9.51 mg/dL — AB (ref 0.61–1.24)
Calcium: 8.3 mg/dL — ABNORMAL LOW (ref 8.9–10.3)
Chloride: 90 mmol/L — ABNORMAL LOW (ref 101–111)
GFR calc Af Amer: 5 mL/min — ABNORMAL LOW (ref >60)
GFR, EST NON AFRICAN AMERICAN: 5 mL/min — AB (ref >60)
Glucose, Bld: 138 mg/dL — ABNORMAL HIGH (ref 65–99)
PHOSPHORUS: 8.5 mg/dL — AB (ref 2.5–4.6)
POTASSIUM: 4.9 mmol/L (ref 3.5–5.1)
Sodium: 130 mmol/L — ABNORMAL LOW (ref 135–145)

## 2015-08-01 LAB — COMPREHENSIVE METABOLIC PANEL
ALK PHOS: 116 U/L (ref 38–126)
ALT: 606 U/L — AB (ref 17–63)
AST: 90 U/L — ABNORMAL HIGH (ref 15–41)
Albumin: 2.9 g/dL — ABNORMAL LOW (ref 3.5–5.0)
Anion gap: 18 — ABNORMAL HIGH (ref 5–15)
BUN: 77 mg/dL — ABNORMAL HIGH (ref 6–20)
CALCIUM: 8.4 mg/dL — AB (ref 8.9–10.3)
CO2: 24 mmol/L (ref 22–32)
CREATININE: 9.48 mg/dL — AB (ref 0.61–1.24)
Chloride: 90 mmol/L — ABNORMAL LOW (ref 101–111)
GFR, EST AFRICAN AMERICAN: 5 mL/min — AB (ref >60)
GFR, EST NON AFRICAN AMERICAN: 5 mL/min — AB (ref >60)
Glucose, Bld: 140 mg/dL — ABNORMAL HIGH (ref 65–99)
Potassium: 4.7 mmol/L (ref 3.5–5.1)
Sodium: 132 mmol/L — ABNORMAL LOW (ref 135–145)
Total Bilirubin: 1.3 mg/dL — ABNORMAL HIGH (ref 0.3–1.2)
Total Protein: 6.3 g/dL — ABNORMAL LOW (ref 6.5–8.1)

## 2015-08-01 LAB — CULTURE, BLOOD (ROUTINE X 2)
Culture: NO GROWTH
Culture: NO GROWTH

## 2015-08-01 LAB — CBC
HCT: 26.7 % — ABNORMAL LOW (ref 39.0–52.0)
HEMATOCRIT: 26.6 % — AB (ref 39.0–52.0)
HEMOGLOBIN: 8.4 g/dL — AB (ref 13.0–17.0)
Hemoglobin: 8.7 g/dL — ABNORMAL LOW (ref 13.0–17.0)
MCH: 31 pg (ref 26.0–34.0)
MCH: 31.9 pg (ref 26.0–34.0)
MCHC: 31.6 g/dL (ref 30.0–36.0)
MCHC: 32.6 g/dL (ref 30.0–36.0)
MCV: 98.2 fL (ref 78.0–100.0)
MCV: 97.8 fL (ref 78.0–100.0)
PLATELETS: 217 10*3/uL (ref 150–400)
Platelets: 223 10*3/uL (ref 150–400)
RBC: 2.71 MIL/uL — AB (ref 4.22–5.81)
RBC: 2.73 MIL/uL — ABNORMAL LOW (ref 4.22–5.81)
RDW: 20.4 % — ABNORMAL HIGH (ref 11.5–15.5)
RDW: 20.5 % — AB (ref 11.5–15.5)
WBC: 10 10*3/uL (ref 4.0–10.5)
WBC: 10.8 10*3/uL — AB (ref 4.0–10.5)

## 2015-08-01 LAB — PROTIME-INR
INR: 1.33 (ref 0.00–1.49)
PROTHROMBIN TIME: 16.6 s — AB (ref 11.6–15.2)

## 2015-08-01 LAB — GLUCOSE, CAPILLARY
GLUCOSE-CAPILLARY: 191 mg/dL — AB (ref 65–99)
Glucose-Capillary: 104 mg/dL — ABNORMAL HIGH (ref 65–99)
Glucose-Capillary: 197 mg/dL — ABNORMAL HIGH (ref 65–99)
Glucose-Capillary: 163 mg/dL — ABNORMAL HIGH (ref 65–99)

## 2015-08-01 LAB — BLOOD GAS, ARTERIAL
ACID-BASE EXCESS: 1.5 mmol/L (ref 0.0–2.0)
BICARBONATE: 25.1 meq/L — AB (ref 20.0–24.0)
Drawn by: 35849
FIO2: 0.21
O2 SAT: 96.8 %
PO2 ART: 86.6 mmHg (ref 80.0–100.0)
Patient temperature: 98.6
TCO2: 26.3 mmol/L (ref 0–100)
pCO2 arterial: 36.7 mmHg (ref 35.0–45.0)
pH, Arterial: 7.45 (ref 7.350–7.450)

## 2015-08-01 LAB — APTT: APTT: 40 s — AB (ref 24–37)

## 2015-08-01 MED ORDER — ALTEPLASE 2 MG IJ SOLR
2.0000 mg | Freq: Once | INTRAMUSCULAR | Status: DC | PRN
  Filled 2015-08-01: qty 2

## 2015-08-01 MED ORDER — DOPAMINE-DEXTROSE 3.2-5 MG/ML-% IV SOLN
0.0000 ug/kg/min | INTRAVENOUS | Status: DC
  Filled 2015-08-01: qty 250

## 2015-08-01 MED ORDER — MAGNESIUM SULFATE 50 % IJ SOLN
40.0000 meq | INTRAMUSCULAR | Status: DC
  Filled 2015-08-01: qty 10

## 2015-08-01 MED ORDER — SODIUM CHLORIDE 0.9 % IV SOLN
INTRAVENOUS | Status: DC
  Filled 2015-08-01: qty 30

## 2015-08-01 MED ORDER — PHENYLEPHRINE HCL 10 MG/ML IJ SOLN
30.0000 ug/min | INTRAVENOUS | Status: DC
  Filled 2015-08-01: qty 2

## 2015-08-01 MED ORDER — VANCOMYCIN HCL 10 G IV SOLR
1250.0000 mg | INTRAVENOUS | Status: AC
  Administered 2015-08-02: 1250 mg via INTRAVENOUS
  Filled 2015-08-01: qty 1250

## 2015-08-01 MED ORDER — DEXMEDETOMIDINE HCL IN NACL 400 MCG/100ML IV SOLN
0.1000 ug/kg/h | INTRAVENOUS | Status: AC
  Administered 2015-08-02: .2 ug/kg/h via INTRAVENOUS
  Filled 2015-08-01: qty 100

## 2015-08-01 MED ORDER — LIDOCAINE HCL (PF) 1 % IJ SOLN: 5.0000 mL | INTRAMUSCULAR | Status: DC | PRN

## 2015-08-01 MED ORDER — POTASSIUM CHLORIDE 2 MEQ/ML IV SOLN
80.0000 meq | INTRAVENOUS | Status: DC
  Filled 2015-08-01: qty 40

## 2015-08-01 MED ORDER — SODIUM CHLORIDE 0.9 % IV SOLN: 100.0000 mL | INTRAVENOUS | Status: DC | PRN

## 2015-08-01 MED ORDER — DARBEPOETIN ALFA 100 MCG/0.5ML IJ SOSY
PREFILLED_SYRINGE | INTRAMUSCULAR | Status: AC
  Filled 2015-08-01: qty 0.5

## 2015-08-01 MED ORDER — NOREPINEPHRINE BITARTRATE 1 MG/ML IV SOLN
0.0000 ug/min | INTRAVENOUS | Status: DC
  Filled 2015-08-01: qty 4

## 2015-08-01 MED ORDER — EPINEPHRINE HCL 1 MG/ML IJ SOLN
0.0000 ug/min | INTRAVENOUS | Status: DC
  Filled 2015-08-01: qty 4

## 2015-08-01 MED ORDER — PENTAFLUOROPROP-TETRAFLUOROETH EX AERO: 1.0000 "application " | INHALATION_SPRAY | CUTANEOUS | Status: DC | PRN

## 2015-08-01 MED ORDER — LIDOCAINE-PRILOCAINE 2.5-2.5 % EX CREA: 1.0000 "application " | TOPICAL_CREAM | CUTANEOUS | Status: DC | PRN

## 2015-08-01 MED ORDER — CHLORHEXIDINE GLUCONATE 4 % EX LIQD
30.0000 mL | CUTANEOUS | Status: AC
  Administered 2015-08-02 (×2): 2 via TOPICAL
  Filled 2015-08-01 (×2): qty 30

## 2015-08-01 MED ORDER — SODIUM CHLORIDE 0.9 % IV SOLN
INTRAVENOUS | Status: AC
  Administered 2015-08-02: 2.3 [IU]/h via INTRAVENOUS
  Filled 2015-08-01: qty 2.5

## 2015-08-01 MED ORDER — HEPARIN SODIUM (PORCINE) 1000 UNIT/ML DIALYSIS: 1000.0000 [IU] | INTRAMUSCULAR | Status: DC | PRN

## 2015-08-01 MED ORDER — DEXTROSE 5 % IV SOLN
1.5000 g | INTRAVENOUS | Status: AC
  Administered 2015-08-02: 1.5 g via INTRAVENOUS
  Filled 2015-08-01: qty 1.5

## 2015-08-01 MED ORDER — NITROGLYCERIN IN D5W 200-5 MCG/ML-% IV SOLN
2.0000 ug/min | INTRAVENOUS | Status: AC
  Administered 2015-08-02: 5 ug/min via INTRAVENOUS
  Filled 2015-08-01: qty 250

## 2015-08-01 NOTE — Progress Notes (Addendum)
PT Cancellation Note and D/C Note  Patient Details Name: Jeffrey [name] MRN: [id number] DOB: [birth year]   Cancelled Treatment:    Reason Eval/Treat Not Completed: Other (comment) (Politely declined, tired from dialysis per pt.  )MD:  PT will sign off due to pt for TAVR surgery tomorrow.  Please reorder PT as appropriate after surgery.  Thanks.  Sign off.     Irwin Brakeman F 08/01/2015, 1:43 PM  [facility] [phone] (pager)

## 2015-08-01 NOTE — Care Management Important Message (Signed)
Important Message  Patient Details  Name: Jeffrey [name] MRN: [id number] Date of Birth: November 03, 1941   Medicare Important Message Given:  Yes-fourth notification given    Loann Quill 08/01/2015, 9:26 AM

## 2015-08-01 NOTE — Progress Notes (Signed)
PROGRESS NOTE  SAKARI WOFFORD [id number] DOB: 1941-05-05 DOA: 07/18/2015 PCP: Thurman Coyer, MD  Brief History 74 year old male with history of diabetes mellitus, coronary artery disease, aortic stenosis, paroxysmal atrial fibrillation, symptomatic bradycardia status post permanent pacemaker on 07/11/2015, hypertension, hyperlipidemia, stroke, ESRD presented with increasing dyspnea, chest discomfort, and cough. Chest x-ray revealed pulmonary edema with bilateral pleural effusions suggesting fluid overload. Nephrology was consulted and the patient was taken to dialysis due to acute on chronic diastolic CHF worsened by severe aortic stenosis. He's been seen by cardiology and nephrology, dialysis for fluid removal. The patient underwent left heart catheterization 07/22/2015 which showed 100% mid RCA, 50% mid LAD. He is tentatively scheduled for TAVR on 08/02/15. Patient's overriding complaint is that of nausea with intermittent emesis. This is thought to be due to his low flow state and resultant possible gastropathy as well as effects of midodrine which was d/ced on 9/4. Assessment/Plan: Acute respiratory failure with hypoxia due to acute on chronic diastolic CHF with underlying severe aortic stenosis -Dialysis for fluid removal,  -appreciate cardiology and renal followup - blood pressure borderline-stable off midodrine -Supportive care with oxygen, nebulizer treatment  -Metoprolol tartrate as tolerated by blood pressure. -Presently stable on 4 L nasal cannula -TAVR planned 08/02/15 Acute on chronic diastolic CHF -[id number] echocardiogram EF 55-60%, severe aortic stenosis, inferior and apical HK -HD to help with fluid management -Continue metoprolol tartrate as BP tolerates -Severe aortic stenosis contributing -daily weights -07/29/15--HD---normally MWF -pt has PPM for complete heart block -New dry wt is around 93- 95 kg Transaminasemia/Ischemic Hepatitis -due to transient  episodes of hypotension and hepatic congestion from CHF -trend LFTs--trending down -07/18/2015 hepatitis B surface antigen negative  -Check hepatitis C antibody--neg -Check lipase--27  -Abdominal ultrasound--s/p cholecystectomy, no liver lesions -Ehrlichia antibodies--neg -repeatRMSF serologies--no increase in IgG or IgM -8/25--received doxy 200mg  x 1 -appreciate GI opinion  Coagulopathy -secondary to liver dysfunction  -restart daily vitamin K -recheck INR--improving--1.96--->1.33 -follow  Severe aortic stenosis.  -appreciate cardiology -TAVR planned 08/02/15  Intermitten/Recurrent nausea -likely due to low flow state from CHF and hypotension -midodrine d/ced by cardiology -prn zofran  Hyperkalemia -Patient was hyperkalemic again today--Kayexalate given in the early morning -07/29/2015, 08/01/15 dialysis -resolved  Chest pain in a setting of established coronary artery disease/Pericarditis Likely due to pericarditis-->conservative management per cardiology,  -continue aspirin and Plavix.--plavix on hold for surgery 9/6 (last dose 9/1) -Elevated troponin due to demand ischemia in the setting of ESRD and decompensated CHF.  -L heart Cath on 07-22-15--100% mid RCA, 50% mid LAD  Paroxysmal atrial fibrillation [place] VASC 2 score - 2.  -going in and out of atrial fibrillation during this admission -on aspirin  -plavix on hold due for surgery; last dose on 07/28/15  -Per cardiology poor candidate for anticoagulation. -rate controlled  Incidental L Adrenal and R Kidney lesions CT scan ordered by cardiology - follow with PCP age appropriate outpatient workup.  Hyperlipidemia - on home dose statin  Diabetes mellitus type 2  -04/27/2015 hemoglobin A1c 6.7  -CBG controlled without Levemir at this time  -NovoLog sliding scale   Family Communication: Daughter updated at beside Disposition Plan: Remain in hospital through 08/02/15     Procedures/Studies: Dg  Chest 2 View  07/27/2015   CLINICAL DATA:  Pneumonia, CHF, chest pain and shortness of breath, but not today  EXAM: CHEST  2 VIEW  COMPARISON:  07/25/2015  FINDINGS: Bilateral interstitial thickening with prominence of the central  pulmonary vasculature bilateral small pleural effusions. No pneumothorax. Stable cardiomegaly. Thoracic aortic atherosclerosis. No acute osseous abnormality. Dual lead cardiac pacemaker.  IMPRESSION: Mild CHF.   Electronically Signed   By: Kathreen Devoid   On: 07/27/2015 17:36   Dg Chest 2 View  07/18/2015   CLINICAL DATA:  Cough, chest pain and shortness of breath tonight. Recent pacemaker placement.  EXAM: CHEST  2 VIEW  COMPARISON:  07/12/2015  FINDINGS: Dual lead right-sided pacemaker, leads intact. No pneumothorax. Cardiomegaly is unchanged. Increased perivascular haziness suggestive of pulmonary edema. Probable bilateral pleural effusions with blunting of the costophrenic angles and associated bibasilar atelectasis. No acute osseous abnormalities are seen.  IMPRESSION: Small pleural effusions and perivascular haziness suggestive of pulmonary edema. Recommend correlation for CHF.   Electronically Signed   By: Jeb Levering M.D.   On: 07/18/2015 02:08   Dg Chest 2 View  07/12/2015   CLINICAL DATA:  Status post pacemaker placement, low right shoulder pain  EXAM: CHEST - 2 VIEW  COMPARISON:  04/27/2015  FINDINGS: Cardiac shadow is stable. Lungs are well aerated bilaterally. No focal infiltrate or sizable effusion is noted. Mild central vascular congestion is noted without interstitial edema. A pacing device is now seen. No pneumothorax is noted. No bony abnormality is seen.  IMPRESSION: Mild vascular congestion.  Status post pacemaker without pneumothorax.   Electronically Signed   By: Inez Catalina M.D.   On: 07/12/2015 07:41   US Abdomen Complete  07/27/2015   CLINICAL DATA:  Mid abdominal pain, elevated LFTs  EXAM: ULTRASOUND ABDOMEN COMPLETE  COMPARISON:  07/21/2015   FINDINGS: Gallbladder: Surgically absent  Common bile duct: Diameter: 5.5 mm  Liver: No focal lesion identified. Within normal limits in parenchymal echogenicity.  IVC: No abnormality visualized.  Pancreas: Obscured by bowel gas  Spleen: Size and appearance within normal limits.  Right Kidney: Length: 9.7 cm.  9.6 cm  Left Kidney: Length: 3 cm simple cyst lower pole. Echogenicity within normal limits. No mass or hydronephrosis visualized.  Abdominal aorta: Largely obscured by bowel gas  Other findings: Bilateral pleural effusions  IMPRESSION: Study is somewhat limited. No acute findings identified other than bilateral pleural effusions.   Electronically Signed   By: Skipper Cliche M.D.   On: 07/27/2015 12:42   Ct Coronary Morp W/cta Cor W/score W/ca W/cm &/or Wo/cm  07/25/2015   ADDENDUM REPORT: 07/25/2015 19:05  CLINICAL DATA:  Aortic stenosis  EXAM: Cardiac TAVR CT  TECHNIQUE: The patient was scanned on a Philips 256 scanner. A 120 kV retrospective scan was triggered in the descending thoracic aorta at 111 HU's. Gantry rotation speed was 270 msecs and collimation was .9 mm. 5 mg of iv Metoprolol or nitro were given. The 3D data set was reconstructed in 5% intervals of the R-R cycle. Systolic and diastolic phases were analyzed on a dedicated work station using MPR, MIP and VRT modes. The patient received 80 cc of contrast.  FINDINGS: Aortic Valve: Trileaflet. Severely thickened and calcified with severely restricted leaflet opening.  Aorta: Minimal diffuse calcifications. Normal caliber. No dissection.  Sinotubular Junction:  36 x 33 mm  Ascending Thoracic Aorta:  38 x 36 mm  Aortic Arch:  Not visualized  Descending Thoracic Aorta:  29 x 28 mm  Sinus of Valsalva Measurements:  Non-coronary:  37 mm  Right -coronary:  38 mm  Left -coronary:  40 mm  Coronary Artery Height above Annulus:  Left Main:  12 mm  Right Coronary:  16 mm  Virtual Basal Annulus Measurements:  Maximum/Minimum Diameter:  28 x 23 mm   Perimeter:  97 mm  Area:  521 mm2  Optimum Fluoroscopic Angle for Delivery:  RAO 2 CRA 2  IMPRESSION: 1. Severely thickened and calcified aortic valve with severely restricted leaflet opening and annular measurements suitable for deployment of 26 mm Edward-SAPIEN 3 valve.  2.  Sufficient annulus to coronary distance.  3.  Optimum Fluoroscopic Angle for Delivery:  RAO 2 CRA 2.  Ena Dawley   Electronically Signed   By: Ena Dawley   On: 07/25/2015 19:05   07/25/2015   EXAM: OVER-READ INTERPRETATION  CT CHEST  The following report is an over-read performed by radiologist Dr. Fonnie Birkenhead [facility] on 07/25/2015. This over-read does not include interpretation of cardiac or coronary anatomy or pathology. The coronary calcium score/coronary CTA interpretation by the cardiologist is attached.  COMPARISON:  07/21/2015.  FINDINGS: Pulmonary arteries are rather enlarged. Low right paratracheal lymph nodes measure up to 11 mm, likely reactive. Visualized portions of the lungs show septal thickening with a focal area of new consolidation in the subpleural lingula. Bilateral effusions and compressive atelectasis in both lower lobes, incompletely imaged.  IMPRESSION: 1. Congestive heart failure. 2. New infectious or inflammatory consolidation in the lingula. 3. Pulmonary arterial hypertension.  Electronically Signed: By: Lorin Picket M.D. On: 07/25/2015 15:19   Dg Abd Portable 1v  07/26/2015   CLINICAL DATA:  Nausea  EXAM: PORTABLE ABDOMEN - 1 VIEW  COMPARISON:  Abdominal CT from 5 days ago  FINDINGS: Small bilateral kidneys with high-density appearance from IV contrast yesterday. Contrast is seen in the urinary bladder, including large diverticulum. Nonobstructive bowel gas pattern. No concerning intra-abdominal mass effect. Cholecystectomy.  IMPRESSION: 1. Nonobstructive bowel gas pattern. 2. Large bladder diverticulum.   Electronically Signed   By: Monte Fantasia M.D.   On: 07/26/2015 08:51     Ct Angio Chest Aorta W/cm &/or Wo/cm  07/23/2015   CLINICAL DATA:  74 year old male with history of severe aortic stenosis. Preprocedural study prior to potential transcatheter aortic valve replacement (TAVR).  EXAM: CT ANGIOGRAPHY CHEST, ABDOMEN AND PELVIS  TECHNIQUE: Multidetector CT imaging through the chest, abdomen and pelvis was performed using the standard protocol during bolus administration of intravenous contrast. Multiplanar reconstructed images and MIPs were obtained and reviewed to evaluate the vascular anatomy.  CONTRAST:  170mL OMNIPAQUE IOHEXOL 350 MG/ML SOLN  COMPARISON:  No priors.  FINDINGS: CTA CHEST FINDINGS  Mediastinum/Lymph Nodes: Heart size is enlarged. There is no significant pericardial fluid, thickening or pericardial calcification. There is atherosclerosis of the thoracic aorta, the great vessels of the mediastinum and the coronary arteries, including calcified atherosclerotic plaque in the left main, left anterior descending, left circumflex and right coronary arteries. Severe thickening and calcification of the aortic valve. Calcifications of the mitral valve and mitral annulus. 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 Dilatation of the pulmonic trunk (4.8 cm in diameter), suggestive of pulmonary arterial hypertension. Multiple borderline enlarged mediastinal lymph nodes measuring up to 9 mm in short axis. No definite pathologically enlarged mediastinal or hilar lymph nodes. Esophagus is unremarkable in appearance. No axillary lymphadenopathy.  Lungs/Pleura: Moderate bilateral pleural effusions layering dependently with extensive passive atelectasis in the dependent portions of the lower lobes of the lungs bilaterally. Mild diffuse ground-glass attenuation and interlobular septal thickening in the lungs suggestive of a background of mild interstitial pulmonary edema. No acute  consolidative  airspace disease. No suspicious appearing pulmonary nodule or mass.  Musculoskeletal/Soft Tissues: There are no aggressive appearing lytic or blastic lesions noted in the visualized portions of the skeleton.  CTA ABDOMEN AND PELVIS FINDINGS  Hepatobiliary: No cystic or solid hepatic lesions. No intra or extrahepatic biliary ductal dilatation. Status post cholecystectomy.  Pancreas: No pancreatic mass. No pancreatic ductal dilatation. No pancreatic or peripancreatic fluid or inflammatory changes.  Spleen: Unremarkable.  Adrenals/Urinary Tract: 2.2 cm left adrenal nodule is indeterminate. Right adrenal gland is normal in appearance. Exophytic 3.5 cm simple cyst in the posterior aspect of the interpolar region of the left kidney. In the lateral interpolar region of the right kidney there is a 1.7 cm high attenuation (74 HU) lesion which could represent an enhancing lesion or a proteinaceous/hemorrhagic cyst. No hydroureteronephrosis. Urinary bladder is remarkable for a 5 cm diverticulum in the posterior aspect of the urinary bladder on the left side.  Stomach/Bowel: The appearance of the stomach is normal. Several diverticulae of the duodenum are noted. No surrounding inflammatory changes to suggest associated duodenal diverticulitis. No pathologic dilatation of small bowel or colon. Numerous colonic diverticulae are noted, without surrounding inflammatory changes to suggest an acute diverticulitis at this time. Normal appendix.  Vascular/Lymphatic: Vascular findings and measurements pertinent to potential TAVR procedure, as detailed below. No aneurysm or dissection identified. No lymphadenopathy noted in the abdomen or pelvis.  Reproductive: Prostate gland and seminal vesicles are unremarkable in appearance.  Other: Small bilateral inguinal hernias containing only fat incidentally noted. No significant volume of ascites. No pneumoperitoneum.  Musculoskeletal: Chronic appearing compression fracture of superior endplate  of L1 with approximately 15% loss of anterior vertebral body height. There are no aggressive appearing lytic or blastic lesions noted in the visualized portions of the skeleton.  VASCULAR MEASUREMENTS PERTINENT TO TAVR:  AORTA:  Minimal Aortic Diameter -  16 x 16 mm  Severity of Aortic Calcification -  severe  RIGHT PELVIS:  Right Common Iliac Artery -  Minimal Diameter - 9.5 x 8.1 mm  Tortuosity - mild  Calcification - moderate  Right External Iliac Artery -  Minimal Diameter - 8.7 x 6.9 mm  Tortuosity - mild  Calcification - mild  Right Common Femoral Artery -  Minimal Diameter - Proximal 9.0 x 7.8 mm; Distal 8.6 x 3.6 mm  Tortuosity - mild  Calcification - moderate proximally, severe distally  LEFT PELVIS:  Left Common Iliac Artery -  Minimal Diameter - 11.6 x 10.0 mm  Tortuosity - mild  Calcification - moderate  Left External Iliac Artery -  Minimal Diameter - 8.8 x 7.8 mm  Tortuosity - mild  Calcification - mild  Left Common Femoral Artery -  Minimal Diameter - Proximal 7.0 x 6.1 mm; Distal 6.5 x 5.8 mm  Tortuosity - mild  Calcification - moderate proximally, severe distally  Review of the MIP images confirms the above findings.  IMPRESSION: 1. Vascular findings and measurements pertinent to potential TAVR procedure, as detailed above. This patient does appear to have suitable pelvic arterial access bilaterally, particularly if vascular access is achieved in the proximal common femoral arteries (distal common femoral arteries are not suitable for vascular access). 2. Severe thickening calcifications of the aortic valve, compatible with the reported clinical history of aortic stenosis. 3. Severe dilatation of the pulmonic trunk and main pulmonary arteries, suggestive of pulmonary arterial hypertension. 4. Cardiomegaly with evidence of mild interstitial pulmonary edema and moderate bilateral pleural effusions, suggesting congestive heart failure. Numerous borderline  enlarged mediastinal lymph nodes are likely  secondary to underlying congestive heart failure. 5. 2.2 cm left adrenal nodule is indeterminate. Statistically, this is likely to represent a small adenoma, and could be further characterized with nonemergent noncontrast CT the abdomen. 6. 1.7 cm high attenuation lesion in the interpolar region of the right kidney may represent either a proteinaceous/hemorrhagic cyst or a solid enhancing lesion. Ideally, this would be characterized by MRI, however, given the patient's pacemaker, further evaluation with nonemergent renal ultrasound is strongly recommended at this time. Alternatively, if this lesion is of similar high attenuation on the followup noncontrast CT the abdomen (recommended for number 6 above), no further characterization may be needed. 7. Additional incidental findings, as above.   Electronically Signed   By: Vinnie Langton M.D.   On: 07/23/2015 16:01   Ct Angio Abd/pel W/ And/or W/o  07/23/2015   CLINICAL DATA:  74 year old male with history of severe aortic stenosis. Preprocedural study prior to potential transcatheter aortic valve replacement (TAVR).  EXAM: CT ANGIOGRAPHY CHEST, ABDOMEN AND PELVIS  TECHNIQUE: Multidetector CT imaging through the chest, abdomen and pelvis was performed using the standard protocol during bolus administration of intravenous contrast. Multiplanar reconstructed images and MIPs were obtained and reviewed to evaluate the vascular anatomy.  CONTRAST:  179mL OMNIPAQUE IOHEXOL 350 MG/ML SOLN  COMPARISON:  No priors.  FINDINGS: CTA CHEST FINDINGS  Mediastinum/Lymph Nodes: Heart size is enlarged. There is no significant pericardial fluid, thickening or pericardial calcification. There is atherosclerosis of the thoracic aorta, the great vessels of the mediastinum and the coronary arteries, including calcified atherosclerotic plaque in the left main, left anterior descending, left circumflex and right coronary arteries. Severe thickening and calcification of the aortic valve.  Calcifications of the mitral valve and mitral annulus. 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 Dilatation of the pulmonic trunk (4.8 cm in diameter), suggestive of pulmonary arterial hypertension. Multiple borderline enlarged mediastinal lymph nodes measuring up to 9 mm in short axis. No definite pathologically enlarged mediastinal or hilar lymph nodes. Esophagus is unremarkable in appearance. No axillary lymphadenopathy.  Lungs/Pleura: Moderate bilateral pleural effusions layering dependently with extensive passive atelectasis in the dependent portions of the lower lobes of the lungs bilaterally. Mild diffuse ground-glass attenuation and interlobular septal thickening in the lungs suggestive of a background of mild interstitial pulmonary edema. No acute consolidative airspace disease. No suspicious appearing pulmonary nodule or mass.  Musculoskeletal/Soft Tissues: There are no aggressive appearing lytic or blastic lesions noted in the visualized portions of the skeleton.  CTA ABDOMEN AND PELVIS FINDINGS  Hepatobiliary: No cystic or solid hepatic lesions. No intra or extrahepatic biliary ductal dilatation. Status post cholecystectomy.  Pancreas: No pancreatic mass. No pancreatic ductal dilatation. No pancreatic or peripancreatic fluid or inflammatory changes.  Spleen: Unremarkable.  Adrenals/Urinary Tract: 2.2 cm left adrenal nodule is indeterminate. Right adrenal gland is normal in appearance. Exophytic 3.5 cm simple cyst in the posterior aspect of the interpolar region of the left kidney. In the lateral interpolar region of the right kidney there is a 1.7 cm high attenuation (74 HU) lesion which could represent an enhancing lesion or a proteinaceous/hemorrhagic cyst. No hydroureteronephrosis. Urinary bladder is remarkable for a 5 cm diverticulum in the posterior aspect of the urinary bladder on the left side.  Stomach/Bowel: The  appearance of the stomach is normal. Several diverticulae of the duodenum are noted. No surrounding inflammatory changes to suggest associated duodenal diverticulitis. No  pathologic dilatation of small bowel or colon. Numerous colonic diverticulae are noted, without surrounding inflammatory changes to suggest an acute diverticulitis at this time. Normal appendix.  Vascular/Lymphatic: Vascular findings and measurements pertinent to potential TAVR procedure, as detailed below. No aneurysm or dissection identified. No lymphadenopathy noted in the abdomen or pelvis.  Reproductive: Prostate gland and seminal vesicles are unremarkable in appearance.  Other: Small bilateral inguinal hernias containing only fat incidentally noted. No significant volume of ascites. No pneumoperitoneum.  Musculoskeletal: Chronic appearing compression fracture of superior endplate of L1 with approximately 15% loss of anterior vertebral body height. There are no aggressive appearing lytic or blastic lesions noted in the visualized portions of the skeleton.  VASCULAR MEASUREMENTS PERTINENT TO TAVR:  AORTA:  Minimal Aortic Diameter -  16 x 16 mm  Severity of Aortic Calcification -  severe  RIGHT PELVIS:  Right Common Iliac Artery -  Minimal Diameter - 9.5 x 8.1 mm  Tortuosity - mild  Calcification - moderate  Right External Iliac Artery -  Minimal Diameter - 8.7 x 6.9 mm  Tortuosity - mild  Calcification - mild  Right Common Femoral Artery -  Minimal Diameter - Proximal 9.0 x 7.8 mm; Distal 8.6 x 3.6 mm  Tortuosity - mild  Calcification - moderate proximally, severe distally  LEFT PELVIS:  Left Common Iliac Artery -  Minimal Diameter - 11.6 x 10.0 mm  Tortuosity - mild  Calcification - moderate  Left External Iliac Artery -  Minimal Diameter - 8.8 x 7.8 mm  Tortuosity - mild  Calcification - mild  Left Common Femoral Artery -  Minimal Diameter - Proximal 7.0 x 6.1 mm; Distal 6.5 x 5.8 mm  Tortuosity - mild  Calcification - moderate proximally,  severe distally  Review of the MIP images confirms the above findings.  IMPRESSION: 1. Vascular findings and measurements pertinent to potential TAVR procedure, as detailed above. This patient does appear to have suitable pelvic arterial access bilaterally, particularly if vascular access is achieved in the proximal common femoral arteries (distal common femoral arteries are not suitable for vascular access). 2. Severe thickening calcifications of the aortic valve, compatible with the reported clinical history of aortic stenosis. 3. Severe dilatation of the pulmonic trunk and main pulmonary arteries, suggestive of pulmonary arterial hypertension. 4. Cardiomegaly with evidence of mild interstitial pulmonary edema and moderate bilateral pleural effusions, suggesting congestive heart failure. Numerous borderline enlarged mediastinal lymph nodes are likely secondary to underlying congestive heart failure. 5. 2.2 cm left adrenal nodule is indeterminate. Statistically, this is likely to represent a small adenoma, and could be further characterized with nonemergent noncontrast CT the abdomen. 6. 1.7 cm high attenuation lesion in the interpolar region of the right kidney may represent either a proteinaceous/hemorrhagic cyst or a solid enhancing lesion. Ideally, this would be characterized by MRI, however, given the patient's pacemaker, further evaluation with nonemergent renal ultrasound is strongly recommended at this time. Alternatively, if this lesion is of similar high attenuation on the followup noncontrast CT the abdomen (recommended for number 6 above), no further characterization may be needed. 7. Additional incidental findings, as above.   Electronically Signed   By: Vinnie Langton M.D.   On: 07/23/2015 16:01         Subjective: For the past 24 hours, the patient has been feeling well without any episodes of nausea. Denies fevers, chills, chest pain, shortness breath, vomiting, diarrhea, abdominal pain,  dysuria, hematuria no rashes.  Objective: Filed Vitals:   08/01/15 1030  08/01/15 1100 08/01/15 1115 08/01/15 1140  BP: 112/66 105/55 111/71 111/61  Pulse: 81 76 85 83  Temp:   98.6 F (37 C) 97.7 F (36.5 C)  TempSrc:   Oral Oral  Resp:   20 20  Height:      Weight:   91.1 kg (200 lb 13.4 oz)   SpO2:   95% 100%    Intake/Output Summary (Last 24 hours) at 08/01/15 1240 Last data filed at 08/01/15 1111  Gross per 24 hour  Intake    340 ml  Output   2971 ml  Net  -2631 ml   Weight change: 1.148 kg (2 lb 8.5 oz) Exam:   General:  Pt is alert, follows commands appropriately, not in acute distress  HEENT: No icterus, No thrush, No neck mass, [place]/AT  Cardiovascular: RRR, S1/S2, no rubs, no gallops  Respiratory: Fine bibasilar crackles. No wheezing.  Abdomen: Soft/+BS, non tender, non distended, no guarding  Extremities: No edema, No lymphangitis, No petechiae, No rashes, no synovitis  Data Reviewed: Basic Metabolic Panel:  Recent Labs Lab 07/25/15 1948  07/29/15 0402 07/29/15 1740 07/30/15 0307 08/01/15 0344 08/01/15 0809  NA 132*  < > 132* 133* 134* 132* 130*  K 5.4*  < > 6.2* 5.4* 3.9 4.7 4.9  CL 92*  < > 92* 93* 91* 90* 90*  CO2 23  < > 22 19* 27 24 21*  GLUCOSE 176*  < > 168* 168* 121* 140* 138*  BUN 64*  < > 80* 89* 47* 77* 81*  CREATININE 9.52*  < > 10.21* 10.47* 7.20* 9.48* 9.51*  CALCIUM 9.3  < > 8.9 8.9 8.7* 8.4* 8.3*  PHOS 6.8*  --   --  9.8*  --   --  8.5*  < > = values in this interval not displayed. Liver Function Tests:  Recent Labs Lab 07/27/15 0345 07/28/15 0600 07/29/15 0402 07/29/15 1740 07/30/15 0307 08/01/15 0344 08/01/15 0809  AST 2886* 2549* 966*  --  358* 90*  --   ALT 1562* 2332* 1813*  --  1199* 606*  --   ALKPHOS 113 126 147*  --  134* 116  --   BILITOT 1.6* 1.3* 1.6*  --  1.5* 1.3*  --   PROT 6.9 6.2* 6.7  --  6.0* 6.3*  --   ALBUMIN 3.2* 3.0* 3.2* 3.0* 2.9* 2.9* 2.9*    Recent Labs Lab 07/27/15 0841  LIPASE 27    No results for input(s): AMMONIA in the last 168 hours. CBC:  Recent Labs Lab 07/27/15 0345 07/28/15 0600 07/29/15 1741 08/01/15 0344 08/01/15 0809  WBC 13.3* 11.5* 11.8* 10.0 10.8*  HGB 8.2* 7.8* 8.3* 8.4* 8.7*  HCT 25.8* 24.1* 25.2* 26.6* 26.7*  MCV 95.6 96.8 96.6 98.2 97.8  PLT 358 258 229 223 217   Cardiac Enzymes: No results for input(s): CKTOTAL, CKMB, CKMBINDEX, TROPONINI in the last 168 hours. BNP: Invalid input(s): POCBNP CBG:  Recent Labs Lab 07/31/15 1108 07/31/15 1630 07/31/15 2104 08/01/15 0639 08/01/15 1144  GLUCAP 174* 146* 181* 191* 104*    Recent Results (from the past 240 hour(s))  Culture, blood (routine x 2)     Status: None (Preliminary result)   Collection Time: 07/27/15  8:30 AM  Result Value Ref Range Status   Specimen Description BLOOD  Final   Special Requests   Final    BOTTLES DRAWN AEROBIC AND ANAEROBIC L AVF RED PORT 10ML   Culture NO GROWTH 4 DAYS  Final  Report Status PENDING  Incomplete  Culture, blood (routine x 2)     Status: None (Preliminary result)   Collection Time: 07/27/15  8:45 AM  Result Value Ref Range Status   Specimen Description BLOOD  Final   Special Requests   Final    BOTTLES DRAWN AEROBIC AND ANAEROBIC L AVF REDPORT 10 ML   Culture NO GROWTH 4 DAYS  Final   Report Status PENDING  Incomplete     Scheduled Meds: . antiseptic oral rinse  7 mL Mouth Rinse BID  . aspirin  325 mg Oral Daily  . calcium carbonate  3 tablet Oral TID WC  . chlorhexidine  30 mL Topical UD  . Darbepoetin Alfa      . darbepoetin (ARANESP) injection - DIALYSIS  100 mcg Intravenous Q Mon-HD  . ferric gluconate (FERRLECIT/NULECIT) IV  62.5 mg Intravenous Q Fri-HD  . heparin  5,000 Units Subcutaneous 3 times per day  . insulin aspart  0-9 Units Subcutaneous TID WC  . metoprolol tartrate  12.5 mg Oral BID  . multivitamin  1 tablet Oral QHS  . pantoprazole  40 mg Oral Q0600  . phytonadione  10 mg Oral Daily   Continuous Infusions:     Vernisha Bacote, DO  [facility] Pager [phone]  If 7PM-7AM, please contact night-coverage [email or web address] Password [email or web address] 08/01/2015, 12:40 PM   LOS: 14 days

## 2015-08-01 NOTE — Progress Notes (Signed)
    Subjective:  Pt doing fine this am. Seen in HD. No chest pain or shortness of breath. Had nausea yesterday around noon for one hour but felt ok remainder of day.  Objective:  Vital Signs in the last 24 hours: Temp:  [96.9 F (36.1 C)-97.9 F (36.6 C)] 96.9 F (36.1 C) (09/05 0730) Pulse Rate:  [59-85] 82 (09/05 1000) Resp:  [18-19] 19 (09/05 0730) BP: (95-107)/(47-65) 106/65 mmHg (09/05 1000) SpO2:  [94 %-99 %] 94 % (09/05 0730) Weight:  [206 lb 2.1 oz (93.5 kg)-208 lb 5.4 oz (94.5 kg)] 208 lb 5.4 oz (94.5 kg) (09/05 0730)  Intake/Output from previous day: 09/04 0701 - 09/05 0700 In: 34 [P.O.:755] Out: 0   Physical Exam: Pt is alert and oriented, chronically ill-appearing male in NAD HEENT: normal Neck: JVP - normal Lungs: CTA bilaterally CV: RRR with 3/6 harsh systolic murmur at the LSB Abd: soft, NT, Positive BS, no hepatomegaly Ext: no C/C/E, right groin with extensive ecchymoses, nontender Skin: warm/dry no rash   Lab Results:  Recent Labs  08/01/15 0344 08/01/15 0809  WBC 10.0 10.8*  HGB 8.4* 8.7*  PLT 223 217    Recent Labs  08/01/15 0344 08/01/15 0809  NA 132* 130*  K 4.7 4.9  CL 90* 90*  CO2 24 21*  GLUCOSE 140* 138*  BUN 77* 81*  CREATININE 9.48* 9.51*   No results for input(s): TROPONINI in the last 72 hours.  Invalid input(s): CK, MB  Assessment/Plan:  1. Acute on chronic diastolic heart failure 2. Severe Aortic stenosis (Stage D) 3. ESRD 4. Transaminitis, improved 5. Coagulopathy, improved with INR 1.3 today 6. Anemia, chronic disease/ESRD 7. Diabetes, Type II 8 Paroxysmal AF - not felt to be a candidate for anticoagulation in past. Currently on ASA alone. Will have to decide on strategy post-TAVR 9. CAD - stable at recent cath with chronic occlusion of dominant RCA, moderate LAD stenosis.   Plan for TAVR tomorrow. All data reviewed (CT's, Cath, echo, lab data). Anticipate left transfemoral approach, 26 mm Sapien 3 transcatheter  valve. The patient is marginal but clinically stable. I don't think delaying TAVR will result in significant clinical improvement, but do have concerns about his multiple chronic medical problems. Hopeful with treatment of his severe aortic stenosis, he will tolerate hemodialysis better from a hemodynamic standpoint.   Following the decision to proceed with transcatheter aortic valve replacement, a discussion has been held regarding what types of management strategies would be attempted intraoperatively in the event of life-threatening complications, including whether or not the patient would be considered a candidate for the use of cardiopulmonary bypass and/or conversion to open sternotomy for attempted surgical intervention.  The patient has been advised of a variety of complications that might develop including but not limited to risks of death, stroke, paravalvular leak, aortic dissection or other major vascular complications, aortic annulus rupture, device embolization, cardiac rupture or perforation, mitral regurgitation, acute myocardial infarction, arrhythmia, heart block or bradycardia requiring permanent pacemaker placement, congestive heart failure, respiratory failure, renal failure, pneumonia, infection, other late complications related to structural valve deterioration or migration, or other complications that might ultimately cause a temporary or permanent loss of functional independence or other long term morbidity.  The patient provides full informed consent for the procedure as described and all questions were answered.  [name], M.D. 08/01/2015, 10:36 AM

## 2015-08-01 NOTE — Procedures (Signed)
STable on HD today.  No SOB, cough or CP.  BP's low 100's to 90's.  For TAVR procedure tomorrow. New dry wt is around 93- 95 kg.  Has lost sig body weight. Exam unchanged.    I was present at this dialysis session, have reviewed the session itself and made  appropriate changes Kelly Splinter MD (pgr) [phone]    ([phone] 05/14/2015, 10:31 AM

## 2015-08-01 NOTE — Anesthesia Preprocedure Evaluation (Addendum)
Anesthesia Evaluation  Patient identified by MRN, date of birth, ID band Patient awake    Reviewed: Allergy & Precautions, NPO status , Patient's Chart, lab work & pertinent test results  History of Anesthesia Complications Negative for: history of anesthetic complications  Airway Mallampati: II  TM Distance: >3 FB Neck ROM: Full    Dental  (+) Edentulous Lower, Edentulous Upper   Pulmonary former smoker (quit 1985),  breath sounds clear to auscultation        Cardiovascular hypertension, Pt. on medications (-) angina+ CAD (100% RCA, mod LD), + Past MI and + Peripheral Vascular Disease + dysrhythmias Atrial Fibrillation + pacemaker + Valvular Problems/Murmurs AS Rhythm:Regular Rate:Normal  07/18/15 ECHO: EF 50-60%, severe AS with peak grad 75 mmHg, mean grad 45 mmHg, mild MR   Neuro/Psych Anxiety CVA, No Residual Symptoms    GI/Hepatic negative GI ROS, Elevated LFTs   Endo/Other  diabetes, Insulin Dependent  Renal/GU ESRF and DialysisRenal disease     Musculoskeletal   Abdominal   Peds  Hematology  (+) Blood dyscrasia (Hb 8.7, INR 1.33), ,   Anesthesia Other Findings   Reproductive/Obstetrics                           Anesthesia Physical Anesthesia Plan  ASA: IV  Anesthesia Plan: General   Post-op Pain Management:    Induction: Intravenous  Airway Management Planned: Oral ETT  Additional Equipment: Arterial line, PA Cath, TEE and Ultrasound Guidance Line Placement  Intra-op Plan:   Post-operative Plan: Extubation in OR  Informed Consent: I have reviewed the patients History and Physical, chart, labs and discussed the procedure including the risks, benefits and alternatives for the proposed anesthesia with the patient or authorized representative who has indicated his/her understanding and acceptance.   Dental advisory given  Plan Discussed with: CRNA and Surgeon  Anesthesia  Plan Comments: (Plan routine monitors, A line, PA cath, GETA with TEE )        Anesthesia Quick Evaluation

## 2015-08-02 ENCOUNTER — Encounter (HOSPITAL_COMMUNITY): Payer: Self-pay | Admitting: Anesthesiology

## 2015-08-02 ENCOUNTER — Inpatient Hospital Stay (HOSPITAL_COMMUNITY): Payer: Medicare Other | Admitting: Certified Registered"

## 2015-08-02 ENCOUNTER — Inpatient Hospital Stay (HOSPITAL_COMMUNITY): Payer: Medicare Other

## 2015-08-02 ENCOUNTER — Encounter (HOSPITAL_COMMUNITY)
Admission: EM | Disposition: A | Discharge status: Home / Self Care | Payer: Medicare Other | Source: Home / Self Care | Attending: Internal Medicine

## 2015-08-02 DIAGNOSIS — I350 Nonrheumatic aortic (valve) stenosis: Secondary | ICD-10-CM

## 2015-08-02 DIAGNOSIS — I351 Nonrheumatic aortic (valve) insufficiency: Secondary | ICD-10-CM

## 2015-08-02 DIAGNOSIS — Z006 Encounter for examination for normal comparison and control in clinical research program: Secondary | ICD-10-CM

## 2015-08-02 DIAGNOSIS — Z952 Presence of prosthetic heart valve: Secondary | ICD-10-CM

## 2015-08-02 HISTORY — DX: Presence of prosthetic heart valve: Z95.2

## 2015-08-02 HISTORY — PX: TRANSCATHETER AORTIC VALVE REPLACEMENT, TRANSFEMORAL: SHX6400

## 2015-08-02 HISTORY — PX: TEE WITHOUT CARDIOVERSION: SHX5443

## 2015-08-02 LAB — CREATININE, SERUM
Creatinine, Ser: 6.84 mg/dL — ABNORMAL HIGH (ref 0.61–1.24)
GFR, EST AFRICAN AMERICAN: 8 mL/min — AB (ref >60)
GFR, EST NON AFRICAN AMERICAN: 7 mL/min — AB (ref >60)

## 2015-08-02 LAB — CBC
HEMATOCRIT: 25.1 % — AB (ref 39.0–52.0)
HEMATOCRIT: 26.9 % — AB (ref 39.0–52.0)
HEMOGLOBIN: 7.9 g/dL — AB (ref 13.0–17.0)
Hemoglobin: 8.4 g/dL — ABNORMAL LOW (ref 13.0–17.0)
MCH: 31.5 pg (ref 26.0–34.0)
MCH: 31.3 pg (ref 26.0–34.0)
MCHC: 31.5 g/dL (ref 30.0–36.0)
MCHC: 31.2 g/dL (ref 30.0–36.0)
MCV: 100 fL (ref 78.0–100.0)
MCV: 100.4 fL — AB (ref 78.0–100.0)
Platelets: 150 10*3/uL (ref 150–400)
Platelets: 150 10*3/uL (ref 150–400)
RBC: 2.51 MIL/uL — AB (ref 4.22–5.81)
RBC: 2.68 MIL/uL — AB (ref 4.22–5.81)
RDW: 21.2 % — ABNORMAL HIGH (ref 11.5–15.5)
RDW: 21.5 % — ABNORMAL HIGH (ref 11.5–15.5)
WBC: 8.6 10*3/uL (ref 4.0–10.5)
WBC: 6.7 10*3/uL (ref 4.0–10.5)

## 2015-08-02 LAB — POCT I-STAT, CHEM 8
BUN: 44 mg/dL — AB (ref 6–20)
BUN: 43 mg/dL — ABNORMAL HIGH (ref 6–20)
BUN: 44 mg/dL — ABNORMAL HIGH (ref 6–20)
BUN: 44 mg/dL — ABNORMAL HIGH (ref 6–20)
CALCIUM ION: 1.04 mmol/L — AB (ref 1.13–1.30)
CALCIUM ION: 1.02 mmol/L — AB (ref 1.13–1.30)
CALCIUM ION: 1.06 mmol/L — AB (ref 1.13–1.30)
CHLORIDE: 95 mmol/L — AB (ref 101–111)
CREATININE: 6.3 mg/dL — AB (ref 0.61–1.24)
Calcium, Ion: 1.01 mmol/L — ABNORMAL LOW (ref 1.13–1.30)
Chloride: 94 mmol/L — ABNORMAL LOW (ref 101–111)
Chloride: 96 mmol/L — ABNORMAL LOW (ref 101–111)
Chloride: 95 mmol/L — ABNORMAL LOW (ref 101–111)
Creatinine, Ser: 6 mg/dL — ABNORMAL HIGH (ref 0.61–1.24)
Creatinine, Ser: 6.2 mg/dL — ABNORMAL HIGH (ref 0.61–1.24)
Creatinine, Ser: 6.8 mg/dL — ABNORMAL HIGH (ref 0.61–1.24)
GLUCOSE: 172 mg/dL — AB (ref 65–99)
GLUCOSE: 169 mg/dL — AB (ref 65–99)
GLUCOSE: 132 mg/dL — AB (ref 65–99)
Glucose, Bld: 176 mg/dL — ABNORMAL HIGH (ref 65–99)
HCT: 27 % — ABNORMAL LOW (ref 39.0–52.0)
HCT: 24 % — ABNORMAL LOW (ref 39.0–52.0)
HCT: 28 % — ABNORMAL LOW (ref 39.0–52.0)
HEMATOCRIT: 27 % — AB (ref 39.0–52.0)
HEMOGLOBIN: 9.2 g/dL — AB (ref 13.0–17.0)
HEMOGLOBIN: 9.2 g/dL — AB (ref 13.0–17.0)
HEMOGLOBIN: 8.2 g/dL — AB (ref 13.0–17.0)
HEMOGLOBIN: 9.5 g/dL — AB (ref 13.0–17.0)
POTASSIUM: 4.1 mmol/L (ref 3.5–5.1)
Potassium: 4.1 mmol/L (ref 3.5–5.1)
Potassium: 4.3 mmol/L (ref 3.5–5.1)
Potassium: 4.4 mmol/L (ref 3.5–5.1)
SODIUM: 131 mmol/L — AB (ref 135–145)
SODIUM: 131 mmol/L — AB (ref 135–145)
Sodium: 130 mmol/L — ABNORMAL LOW (ref 135–145)
Sodium: 131 mmol/L — ABNORMAL LOW (ref 135–145)
TCO2: 24 mmol/L (ref 0–100)
TCO2: 22 mmol/L (ref 0–100)
TCO2: 24 mmol/L (ref 0–100)
TCO2: 23 mmol/L (ref 0–100)

## 2015-08-02 LAB — GLUCOSE, CAPILLARY
GLUCOSE-CAPILLARY: 148 mg/dL — AB (ref 65–99)
GLUCOSE-CAPILLARY: 99 mg/dL (ref 65–99)
GLUCOSE-CAPILLARY: 96 mg/dL (ref 65–99)
GLUCOSE-CAPILLARY: 109 mg/dL — AB (ref 65–99)
GLUCOSE-CAPILLARY: 117 mg/dL — AB (ref 65–99)
Glucose-Capillary: 207 mg/dL — ABNORMAL HIGH (ref 65–99)

## 2015-08-02 LAB — POCT I-STAT 4, (NA,K, GLUC, HGB,HCT)
Glucose, Bld: 151 mg/dL — ABNORMAL HIGH (ref 65–99)
Glucose, Bld: 135 mg/dL — ABNORMAL HIGH (ref 65–99)
HCT: 28 % — ABNORMAL LOW (ref 39.0–52.0)
HCT: 28 % — ABNORMAL LOW (ref 39.0–52.0)
HEMOGLOBIN: 9.5 g/dL — AB (ref 13.0–17.0)
Hemoglobin: 9.5 g/dL — ABNORMAL LOW (ref 13.0–17.0)
Potassium: 4.2 mmol/L (ref 3.5–5.1)
Potassium: 4.2 mmol/L (ref 3.5–5.1)
Sodium: 133 mmol/L — ABNORMAL LOW (ref 135–145)
Sodium: 133 mmol/L — ABNORMAL LOW (ref 135–145)

## 2015-08-02 LAB — POCT I-STAT 3, ART BLOOD GAS (G3+)
ACID-BASE DEFICIT: 2 mmol/L (ref 0.0–2.0)
BICARBONATE: 24.8 meq/L — AB (ref 20.0–24.0)
BICARBONATE: 22.4 meq/L (ref 20.0–24.0)
O2 SAT: 91 %
O2 Saturation: 99 %
PH ART: 7.372 (ref 7.350–7.450)
PO2 ART: 159 mmHg — AB (ref 80.0–100.0)
PO2 ART: 56 mmHg — AB (ref 80.0–100.0)
TCO2: 26 mmol/L (ref 0–100)
TCO2: 23 mmol/L (ref 0–100)
pCO2 arterial: 42.8 mmHg (ref 35.0–45.0)
pCO2 arterial: 33.3 mmHg — ABNORMAL LOW (ref 35.0–45.0)
pH, Arterial: 7.432 (ref 7.350–7.450)

## 2015-08-02 LAB — HEMOGLOBIN A1C
HEMOGLOBIN A1C: 5.6 % (ref 4.8–5.6)
Mean Plasma Glucose: 114 mg/dL

## 2015-08-02 LAB — PROTIME-INR
INR: 1.57 — ABNORMAL HIGH (ref 0.00–1.49)
PROTHROMBIN TIME: 18.8 s — AB (ref 11.6–15.2)

## 2015-08-02 LAB — PREPARE RBC (CROSSMATCH)

## 2015-08-02 LAB — APTT: aPTT: 39 seconds — ABNORMAL HIGH (ref 24–37)

## 2015-08-02 LAB — SURGICAL PCR SCREEN
MRSA, PCR: NEGATIVE
STAPHYLOCOCCUS AUREUS: NEGATIVE

## 2015-08-02 LAB — MAGNESIUM: Magnesium: 2 mg/dL (ref 1.7–2.4)

## 2015-08-02 SURGERY — IMPLANTATION, AORTIC VALVE, TRANSCATHETER, FEMORAL APPROACH
Anesthesia: General | Site: Groin

## 2015-08-02 MED ORDER — 0.9 % SODIUM CHLORIDE (POUR BTL) OPTIME
TOPICAL | Status: DC | PRN
  Administered 2015-08-02: 4000 mL

## 2015-08-02 MED ORDER — IODIXANOL 320 MG/ML IV SOLN
INTRAVENOUS | Status: DC | PRN
  Administered 2015-08-02: 40.1 mL via INTRAVENOUS

## 2015-08-02 MED ORDER — GLYCOPYRROLATE 0.2 MG/ML IJ SOLN
INTRAMUSCULAR | Status: AC
  Filled 2015-08-02: qty 3

## 2015-08-02 MED ORDER — HEPARIN SODIUM (PORCINE) 1000 UNIT/ML IJ SOLN
INTRAMUSCULAR | Status: DC | PRN
Start: 2015-08-02 — End: 2015-08-02
  Administered 2015-08-02: 13000 [IU] via INTRAVENOUS

## 2015-08-02 MED ORDER — TRAMADOL HCL 50 MG PO TABS
50.0000 mg | ORAL_TABLET | ORAL | Status: DC | PRN
  Administered 2015-08-04 – 2015-08-05 (×2): 100 mg via ORAL
  Filled 2015-08-02 (×2): qty 2

## 2015-08-02 MED ORDER — CLOPIDOGREL BISULFATE 75 MG PO TABS
75.0000 mg | ORAL_TABLET | Freq: Every day | ORAL | Status: DC
  Administered 2015-08-03 – 2015-08-05 (×3): 75 mg via ORAL
  Filled 2015-08-02 (×4): qty 1

## 2015-08-02 MED ORDER — HEPARIN SODIUM (PORCINE) 1000 UNIT/ML IJ SOLN
INTRAMUSCULAR | Status: AC
  Filled 2015-08-02: qty 2

## 2015-08-02 MED ORDER — GLYCOPYRROLATE 0.2 MG/ML IJ SOLN
INTRAMUSCULAR | Status: DC | PRN
  Administered 2015-08-02: 0.6 mg via INTRAVENOUS

## 2015-08-02 MED ORDER — DEXMEDETOMIDINE HCL IN NACL 200 MCG/50ML IV SOLN: 0.1000 ug/kg/h | INTRAVENOUS | Status: DC

## 2015-08-02 MED ORDER — PROTAMINE SULFATE 10 MG/ML IV SOLN
INTRAVENOUS | Status: DC | PRN
  Administered 2015-08-02 (×3): 20 mg via INTRAVENOUS
  Administered 2015-08-02: 10 mg via INTRAVENOUS

## 2015-08-02 MED ORDER — LIDOCAINE HCL (CARDIAC) 20 MG/ML IV SOLN
INTRAVENOUS | Status: DC | PRN
  Administered 2015-08-02: 20 mg via INTRAVENOUS
  Administered 2015-08-02: 30 mg via INTRAVENOUS

## 2015-08-02 MED ORDER — SODIUM CHLORIDE 0.9 % IJ SOLN
INTRAMUSCULAR | Status: AC
  Filled 2015-08-02: qty 20

## 2015-08-02 MED ORDER — ROCURONIUM BROMIDE 100 MG/10ML IV SOLN
INTRAVENOUS | Status: DC | PRN
Start: 2015-08-02 — End: 2015-08-02
  Administered 2015-08-02: 25 mg via INTRAVENOUS
  Administered 2015-08-02: 5 mg via INTRAVENOUS

## 2015-08-02 MED ORDER — ROCURONIUM BROMIDE 50 MG/5ML IV SOLN
INTRAVENOUS | Status: AC
  Filled 2015-08-02: qty 1

## 2015-08-02 MED ORDER — SUCCINYLCHOLINE CHLORIDE 20 MG/ML IJ SOLN
INTRAMUSCULAR | Status: AC
  Filled 2015-08-02: qty 1

## 2015-08-02 MED ORDER — MIDAZOLAM HCL 2 MG/2ML IJ SOLN
INTRAMUSCULAR | Status: AC
  Filled 2015-08-02: qty 4

## 2015-08-02 MED ORDER — NEOSTIGMINE METHYLSULFATE 10 MG/10ML IV SOLN
INTRAVENOUS | Status: DC | PRN
  Administered 2015-08-02: 4 mg via INTRAVENOUS

## 2015-08-02 MED ORDER — ATORVASTATIN CALCIUM 40 MG PO TABS
40.0000 mg | ORAL_TABLET | Freq: Every day | ORAL | Status: DC
  Administered 2015-08-02 – 2015-08-05 (×4): 40 mg via ORAL
  Filled 2015-08-02 (×4): qty 1

## 2015-08-02 MED ORDER — DEXTROSE 5 % IV SOLN
0.0000 ug/min | INTRAVENOUS | Status: DC
  Filled 2015-08-02: qty 2

## 2015-08-02 MED ORDER — FENTANYL CITRATE (PF) 250 MCG/5ML IJ SOLN
INTRAMUSCULAR | Status: AC
  Filled 2015-08-02: qty 5

## 2015-08-02 MED ORDER — OXYCODONE HCL 5 MG PO TABS
5.0000 mg | ORAL_TABLET | ORAL | Status: DC | PRN
  Administered 2015-08-02 – 2015-08-04 (×7): 5 mg via ORAL
  Administered 2015-08-05 (×2): 10 mg via ORAL
  Filled 2015-08-02: qty 2
  Filled 2015-08-02 (×7): qty 1
  Filled 2015-08-02: qty 2

## 2015-08-02 MED ORDER — LACTATED RINGERS IV SOLN: 500.0000 mL | Freq: Once | INTRAVENOUS | Status: DC | PRN

## 2015-08-02 MED ORDER — MORPHINE SULFATE (PF) 2 MG/ML IV SOLN: 1.0000 mg | INTRAVENOUS | Status: AC | PRN

## 2015-08-02 MED ORDER — SODIUM CHLORIDE 0.9 % IV SOLN
INTRAVENOUS | Status: DC
  Filled 2015-08-02: qty 2.5

## 2015-08-02 MED ORDER — SODIUM CHLORIDE 0.9 % IR SOLN
Status: DC | PRN
  Administered 2015-08-02: 1500 mL

## 2015-08-02 MED ORDER — LIDOCAINE HCL 4 % MT SOLN
OROMUCOSAL | Status: DC | PRN
Start: 2015-08-02 — End: 2015-08-02
  Administered 2015-08-02: 4 mL via TOPICAL

## 2015-08-02 MED ORDER — SODIUM CHLORIDE 0.9 % IV SOLN: 250.0000 mL | INTRAVENOUS | Status: DC | PRN

## 2015-08-02 MED ORDER — EPHEDRINE SULFATE 50 MG/ML IJ SOLN
INTRAMUSCULAR | Status: AC
  Filled 2015-08-02: qty 1

## 2015-08-02 MED ORDER — FENTANYL CITRATE (PF) 100 MCG/2ML IJ SOLN
INTRAMUSCULAR | Status: DC | PRN
  Administered 2015-08-02: 50 ug via INTRAVENOUS
  Administered 2015-08-02 (×3): 25 ug via INTRAVENOUS

## 2015-08-02 MED ORDER — FAMOTIDINE IN NACL 20-0.9 MG/50ML-% IV SOLN
20.0000 mg | Freq: Two times a day (BID) | INTRAVENOUS | Status: DC
  Administered 2015-08-02: 20 mg via INTRAVENOUS
  Filled 2015-08-02: qty 50

## 2015-08-02 MED ORDER — SODIUM CHLORIDE 0.9 % IJ SOLN: 3.0000 mL | INTRAMUSCULAR | Status: DC | PRN

## 2015-08-02 MED ORDER — ONDANSETRON HCL 4 MG/2ML IJ SOLN
INTRAMUSCULAR | Status: DC | PRN
  Administered 2015-08-02: 4 mg via INTRAVENOUS

## 2015-08-02 MED ORDER — MORPHINE SULFATE (PF) 2 MG/ML IV SOLN: 2.0000 mg | INTRAVENOUS | Status: DC | PRN

## 2015-08-02 MED ORDER — ALBUMIN HUMAN 5 % IV SOLN
250.0000 mL | INTRAVENOUS | Status: DC | PRN
  Filled 2015-08-02: qty 250

## 2015-08-02 MED ORDER — ARTIFICIAL TEARS OP OINT
TOPICAL_OINTMENT | OPHTHALMIC | Status: AC
  Filled 2015-08-02: qty 3.5

## 2015-08-02 MED ORDER — PROPOFOL 10 MG/ML IV BOLUS
INTRAVENOUS | Status: AC
  Filled 2015-08-02: qty 20

## 2015-08-02 MED ORDER — PHENYLEPHRINE HCL 10 MG/ML IJ SOLN
20.0000 mg | INTRAVENOUS | Status: DC | PRN
  Administered 2015-08-02: 20 ug/min via INTRAVENOUS

## 2015-08-02 MED ORDER — NEOSTIGMINE METHYLSULFATE 10 MG/10ML IV SOLN
INTRAVENOUS | Status: AC
  Filled 2015-08-02: qty 1

## 2015-08-02 MED ORDER — ONDANSETRON HCL 4 MG/2ML IJ SOLN
INTRAMUSCULAR | Status: AC
  Filled 2015-08-02: qty 2

## 2015-08-02 MED ORDER — NOREPINEPHRINE BITARTRATE 1 MG/ML IV SOLN
4000.0000 ug | INTRAVENOUS | Status: DC | PRN
Start: 2015-08-02 — End: 2015-08-02
  Administered 2015-08-02: 1 ug/min via INTRAVENOUS

## 2015-08-02 MED ORDER — SODIUM CHLORIDE 0.9 % IV SOLN
INTRAVENOUS | Status: DC | PRN
  Administered 2015-08-02: 07:00:00 via INTRAVENOUS

## 2015-08-02 MED ORDER — MIDAZOLAM HCL 2 MG/2ML IJ SOLN: 2.0000 mg | INTRAMUSCULAR | Status: DC | PRN

## 2015-08-02 MED ORDER — VANCOMYCIN HCL IN DEXTROSE 1-5 GM/200ML-% IV SOLN
1000.0000 mg | Freq: Once | INTRAVENOUS | Status: AC
  Administered 2015-08-02: 1000 mg via INTRAVENOUS
  Filled 2015-08-02: qty 200

## 2015-08-02 MED ORDER — DEXTROSE 5 % IV SOLN
1.5000 g | Freq: Two times a day (BID) | INTRAVENOUS | Status: AC
  Administered 2015-08-02 – 2015-08-03 (×3): 1.5 g via INTRAVENOUS
  Filled 2015-08-02 (×5): qty 1.5

## 2015-08-02 MED ORDER — PROPOFOL 10 MG/ML IV BOLUS
INTRAVENOUS | Status: DC | PRN
  Administered 2015-08-02: 80 mg via INTRAVENOUS

## 2015-08-02 MED ORDER — NITROGLYCERIN IN D5W 200-5 MCG/ML-% IV SOLN: 0.0000 ug/min | INTRAVENOUS | Status: DC

## 2015-08-02 MED ORDER — LIDOCAINE HCL (CARDIAC) 20 MG/ML IV SOLN
INTRAVENOUS | Status: AC
  Filled 2015-08-02: qty 10

## 2015-08-02 MED ORDER — PROTAMINE SULFATE 10 MG/ML IV SOLN
INTRAVENOUS | Status: AC
  Filled 2015-08-02: qty 25

## 2015-08-02 MED ORDER — INSULIN REGULAR BOLUS VIA INFUSION
0.0000 [IU] | Freq: Three times a day (TID) | INTRAVENOUS | Status: DC
  Filled 2015-08-02: qty 10

## 2015-08-02 MED ORDER — SODIUM CHLORIDE 0.9 % IJ SOLN
3.0000 mL | Freq: Two times a day (BID) | INTRAMUSCULAR | Status: DC
  Administered 2015-08-02 – 2015-08-04 (×5): 3 mL via INTRAVENOUS

## 2015-08-02 SURGICAL SUPPLY — 108 items
ADAPTER UNIV SWAN GANZ BIP (ADAPTER) IMPLANT
ADAPTER UNV SWAN GANZ BIP (ADAPTER) ×2
ADH SKN CLS APL DERMABOND .7 (GAUZE/BANDAGES/DRESSINGS) ×2
ADPR CATH UNV NS SG CATH (ADAPTER) ×2
ATTRACTOMAT 16X20 MAGNETIC DRP (DRAPES) IMPLANT
BAG BANDED W/RUBBER/TAPE 36X54 (MISCELLANEOUS) ×4 IMPLANT
BAG DECANTER FOR FLEXI CONT (MISCELLANEOUS) IMPLANT
BAG EQP BAND 135X91 W/RBR TAPE (MISCELLANEOUS) ×2
BAG SNAP BAND KOVER 36X36 (MISCELLANEOUS) ×8 IMPLANT
BLADE 10 SAFETY STRL DISP (BLADE) ×2 IMPLANT
BLADE STERNUM SYSTEM 6 (BLADE) ×4 IMPLANT
BLADE SURG ROTATE 9660 (MISCELLANEOUS) ×2 IMPLANT
CABLE PACING FASLOC BIEGE (MISCELLANEOUS) ×2 IMPLANT
CABLE PACING FASLOC BLUE (MISCELLANEOUS) ×2 IMPLANT
CANISTER SUCTION 2500CC (MISCELLANEOUS) ×2 IMPLANT
CANNULA FEM VENOUS REMOTE 22FR (CANNULA) IMPLANT
CANNULA OPTISITE PERFUSION 16F (CANNULA) IMPLANT
CANNULA OPTISITE PERFUSION 18F (CANNULA) IMPLANT
CATH DIAG EXPO 6F AL2 (CATHETERS) ×2 IMPLANT
CATH DIAG EXPO 6F VENT PIG 145 (CATHETERS) ×4 IMPLANT
CATH S G BIP PACING (SET/KITS/TRAYS/PACK) ×6 IMPLANT
CATH STRAIGHT 5FR 65CM (CATHETERS) ×2 IMPLANT
CLIP TI MEDIUM 24 (CLIP) ×4 IMPLANT
CLIP TI WIDE RED SMALL 24 (CLIP) ×4 IMPLANT
CONT SPEC 4OZ CLIKSEAL STRL BL (MISCELLANEOUS) ×6 IMPLANT
COVER BACK TABLE 24X17X13 BIG (DRAPES) ×2 IMPLANT
COVER DOME SNAP 22 D (MISCELLANEOUS) ×4 IMPLANT
COVER MAYO STAND STRL (DRAPES) ×4 IMPLANT
COVER TABLE BACK 60X90 (DRAPES) ×2 IMPLANT
CRADLE DONUT ADULT HEAD (MISCELLANEOUS) ×4 IMPLANT
DERMABOND ADVANCED (GAUZE/BANDAGES/DRESSINGS) ×2
DERMABOND ADVANCED .7 DNX12 (GAUZE/BANDAGES/DRESSINGS) ×2 IMPLANT
DRAPE INCISE IOBAN 66X45 STRL (DRAPES) ×2 IMPLANT
DRAPE SLUSH MACHINE 52X66 (DRAPES) ×4 IMPLANT
DRAPE TABLE COVER HEAVY DUTY (DRAPES) ×4 IMPLANT
DRSG TEGADERM 4X4.75 (GAUZE/BANDAGES/DRESSINGS) ×4 IMPLANT
ELECT REM PT RETURN 9FT ADLT (ELECTROSURGICAL) ×8
ELECTRODE REM PT RTRN 9FT ADLT (ELECTROSURGICAL) ×4 IMPLANT
FELT TEFLON 6X6 (MISCELLANEOUS) ×4 IMPLANT
FEMORAL VENOUS CANN RAP (CANNULA) IMPLANT
GAUZE SPONGE 4X4 12PLY STRL (GAUZE/BANDAGES/DRESSINGS) ×4 IMPLANT
GLOVE BIO SURGEON STRL SZ 6.5 (GLOVE) ×2 IMPLANT
GLOVE BIO SURGEONS STRL SZ 6.5 (GLOVE) ×2
GLOVE BIOGEL PI IND STRL 6.5 (GLOVE) IMPLANT
GLOVE BIOGEL PI INDICATOR 6.5 (GLOVE) ×8
GLOVE ECLIPSE 7.5 STRL STRAW (GLOVE) ×2 IMPLANT
GLOVE ECLIPSE 8.0 STRL XLNG CF (GLOVE) ×6 IMPLANT
GLOVE EUDERMIC 7 POWDERFREE (GLOVE) ×4 IMPLANT
GLOVE ORTHO TXT STRL SZ7.5 (GLOVE) ×4 IMPLANT
GOWN STRL REUS W/ TWL LRG LVL3 (GOWN DISPOSABLE) ×6 IMPLANT
GOWN STRL REUS W/ TWL XL LVL3 (GOWN DISPOSABLE) ×12 IMPLANT
GOWN STRL REUS W/TWL LRG LVL3 (GOWN DISPOSABLE) ×24
GOWN STRL REUS W/TWL XL LVL3 (GOWN DISPOSABLE) ×24
GUIDEWIRE SAF TJ AMPL .035X180 (WIRE) ×4 IMPLANT
GUIDEWIRE SAFE TJ AMPLATZ EXST (WIRE) ×2 IMPLANT
GUIDEWIRE STRAIGHT .035 260CM (WIRE) ×2 IMPLANT
INSERT FOGARTY 61MM (MISCELLANEOUS) ×4 IMPLANT
INSERT FOGARTY SM (MISCELLANEOUS) ×8 IMPLANT
INSERT FOGARTY XLG (MISCELLANEOUS) IMPLANT
KIT BASIN OR (CUSTOM PROCEDURE TRAY) ×4 IMPLANT
KIT DILATOR VASC 18G NDL (KITS) IMPLANT
KIT ROOM TURNOVER OR (KITS) ×4 IMPLANT
KIT SUCTION CATH 14FR (SUCTIONS) ×6 IMPLANT
NDL PERC 18GX7CM (NEEDLE) ×2 IMPLANT
NEEDLE PERC 18GX7CM (NEEDLE) ×4 IMPLANT
NS IRRIG 1000ML POUR BTL (IV SOLUTION) ×16 IMPLANT
PACK AORTA (CUSTOM PROCEDURE TRAY) ×4 IMPLANT
PAD ARMBOARD 7.5X6 YLW CONV (MISCELLANEOUS) ×8 IMPLANT
PAD ELECT DEFIB RADIOL ZOLL (MISCELLANEOUS) ×4 IMPLANT
PATCH TACHOSII LRG 9.5X4.8 (VASCULAR PRODUCTS) IMPLANT
SHEATH PINNACLE 6F 10CM (SHEATH) ×4 IMPLANT
SHIELD RADPAD SCOOP 12X17 (MISCELLANEOUS) ×2 IMPLANT
SLEEVE REPOSITIONING LENGTH 30 (MISCELLANEOUS) ×2 IMPLANT
SPONGE GAUZE 4X4 12PLY STER LF (GAUZE/BANDAGES/DRESSINGS) ×2 IMPLANT
SPONGE LAP 4X18 X RAY DECT (DISPOSABLE) ×4 IMPLANT
STOPCOCK 4 WAY LG BORE MALE ST (IV SETS) ×2 IMPLANT
STOPCOCK MORSE 400PSI 3WAY (MISCELLANEOUS) ×8 IMPLANT
SUT ETHIBOND X763 2 0 SH 1 (SUTURE) ×4 IMPLANT
SUT GORETEX CV 4 TH 22 36 (SUTURE) ×4 IMPLANT
SUT GORETEX CV4 TH-18 (SUTURE) ×10 IMPLANT
SUT GORETEX TH-18 36 INCH (SUTURE) ×4 IMPLANT
SUT MNCRL AB 3-0 PS2 18 (SUTURE) ×4 IMPLANT
SUT PROLENE 3 0 SH1 36 (SUTURE) IMPLANT
SUT PROLENE 4 0 RB 1 (SUTURE)
SUT PROLENE 4-0 RB1 .5 CRCL 36 (SUTURE) ×2 IMPLANT
SUT PROLENE 5 0 C 1 36 (SUTURE) ×8 IMPLANT
SUT PROLENE 6 0 C 1 30 (SUTURE) ×8 IMPLANT
SUT SILK  1 MH (SUTURE)
SUT SILK 1 MH (SUTURE) ×2 IMPLANT
SUT SILK 2 0 SH CR/8 (SUTURE) IMPLANT
SUT VIC AB 2-0 CT1 27 (SUTURE)
SUT VIC AB 2-0 CT1 TAPERPNT 27 (SUTURE) ×2 IMPLANT
SUT VIC AB 2-0 CTX 36 (SUTURE) IMPLANT
SUT VIC AB 3-0 SH 8-18 (SUTURE) ×14 IMPLANT
SYR 30ML LL (SYRINGE) ×12 IMPLANT
SYR 50ML LL SCALE MARK (SYRINGE) ×4 IMPLANT
SYRINGE 10CC LL (SYRINGE) ×6 IMPLANT
TAPE CLOTH SURG 4X10 WHT LF (GAUZE/BANDAGES/DRESSINGS) ×2 IMPLANT
TOWEL OR 17X26 10 PK STRL BLUE (TOWEL DISPOSABLE) ×8 IMPLANT
TRANSDUCER W/STOPCOCK (MISCELLANEOUS) ×4 IMPLANT
TRAY FOLEY IC TEMP SENS 14FR (CATHETERS) ×4 IMPLANT
TUBE SUCT INTRACARD DLP 20F (MISCELLANEOUS) IMPLANT
TUBING ART PRESS 72  MALE/FEM (TUBING) ×2
TUBING ART PRESS 72 MALE/FEM (TUBING) IMPLANT
TUBING HIGH PRESSURE 120CM (CONNECTOR) ×4 IMPLANT
VALVE HEART TRANSCATH SZ3 29MM (Prosthesis & Implant Heart) ×2 IMPLANT
WIRE .035 3MM-J 145CM (WIRE) ×2 IMPLANT
WIRE AMPLATZ SS-J .035X180CM (WIRE) ×2 IMPLANT

## 2015-08-02 NOTE — Progress Notes (Signed)
Patient had TAVR this am.  Pt presently in ICU Dr. [name] has kindly accepted care of this patient Please contact us if there is any questions or concerns.  [place] signing off.  DTat

## 2015-08-02 NOTE — Op Note (Signed)
CARDIOTHORACIC SURGERY OPERATIVE NOTE  Date of Procedure:  08/02/2015  Preoperative Diagnosis: Severe Aortic Stenosis   Postoperative Diagnosis: Same   Procedure:    Transcatheter Aortic Valve Replacement - Open Left Transfemoral Approach  Edwards Sapien 3 Transcatheter Heart Valve (size 29 mm, model # 9600TFX, serial # [id number])   Co-Surgeons:  Valentina Gu. Roxy Manns, MD and [name], MD  Assistants:   Gaye Pollack, MD   Anesthesiologist:  Midge Minium, MD  Echocardiographer:  Jenkins Rouge, MD  Pre-operative Echo Findings:  Severe aortic stenosis  moderate left ventricular systolic dysfunction  Mild aortic insufficiency  Mild mitral regurgitation  Post-operative Echo Findings:  Trvial paravalvular leak  Unchanged left ventricular systolic function      DETAILS OF THE OPERATIVE PROCEDURE  The majority of the procedure is documented separately in a procedure note by Dr. Burt Knack.   TRANSFEMORAL ACCESS:   A small incision is made in the left groin immediately over the common femoral artery. The subcutaneous tissues are divided with electrocautery and the anterior surface of the common femoral artery is identified. Sharp dissection is utilized to free up the artery proximally and distally.  Because of severe plaque in the common femoral artery dissection was continued proximally to the external iliac artery.  A pair of CV-4 Gore-tex sutures are place as diamond-shaped purse-strings on the anterior surface of the external iliac artery.  The patient is heparinized systemically and ACT verified > 250 seconds.  The external iliac artery is punctured using an 18 gauge needle and a soft J-tipped guidewire is passed into the common iliac artery under fluoroscopic guidance.  A 6 Fr straight diagnostic catheter is placed over the guidewire and the guidewire is removed.  An Amplatz super stiff guidewire is passed through the sheath into the descending thoracic aorta and the  introducing diagnostic catheter is removed.  Serial dilators are passed over the guidewire under continuous fluoroscopic guidance, making certain that each dilator passes easily all of the way into the distal abdominal aorta.  A 16 Fr Commander introducer sheath is passed over the guidewire into the abdominal aorta.  The introducing dilator is removed, the sheath is flushed with heparinized saline, and the sheath is secured to the skin.    FEMORAL SHEATH REMOVAL AND ARTERIAL CLOSURE:  After the completion of successful valve deployment as documented separately by Dr. Burt Knack, the femoral artery sheath is removed and the arteriotomy is closed using the previously placed Gore-tex purse-string sutures. Once the repair has been completed protamine was administered to reverse the anticoagulation. The incision is irrigated with saline solution and subsequently closed in multiple layers using absorbable suture.  The skin incision is closed using a subcuticular skin closure.     Valentina Gu. Roxy Manns MD 08/02/2015 10:17 AM

## 2015-08-02 NOTE — Progress Notes (Signed)
  Echocardiogram Echocardiogram Transesophageal has been performed.  Donata Clay 08/02/2015, 12:06 PM

## 2015-08-02 NOTE — Progress Notes (Signed)
Okay to transport pt w/o tele per verbal order [place].

## 2015-08-02 NOTE — Transfer of Care (Signed)
Immediate Anesthesia Transfer of Care Note  Patient: Jeffrey [name]  Procedure(s) Performed: Procedure(s): TRANSCATHETER AORTIC VALVE REPLACEMENT, TRANSFEMORAL (Left) TRANSESOPHAGEAL ECHOCARDIOGRAM (TEE) (N/A)  Patient Location: SICU  Anesthesia Type:General  Level of Consciousness: awake and alert   Airway & Oxygen Therapy: Patient Spontanous Breathing and Patient connected to nasal cannula oxygen  Post-op Assessment: Report given to RN and Post -op Vital signs reviewed and stable  Post vital signs: Reviewed and stable  Last Vitals:  Filed Vitals:   08/02/15 0537  BP: 91/48  Pulse: 74  Temp: 36.5 C  Resp: 21    Complications: No apparent anesthesia complications

## 2015-08-02 NOTE — Progress Notes (Signed)
Pt off tele, CCMD notified.

## 2015-08-02 NOTE — Op Note (Signed)
[facility]  TAVR OPERATIVE NOTE   Date of Procedure:  08/02/2015  Preoperative Diagnosis: Severe Aortic Stenosis   Postoperative Diagnosis: Same   Procedure:    Transcatheter Aortic Valve Replacement - Transfemoral Approach  Edwards Sapien 3 THV (size 29 mm, model # 9600TFX, serial # [id number])   Co-Surgeons:  Valentina Gu. Roxy Manns, MD and [name], MD  Assistants:   Gaye Pollack, MD   Anesthesiologist:  Dr Glennon Mac  Echocardiographer:  Dr Johnsie Cancel  Pre-operative Echo Findings:  Severe aortic stenosis  Mild left ventricular systolic dysfunction  Mild MR  Post-operative Echo Findings:  Trace paravalvular leak  Mild left ventricular systolic dysfunction  BRIEF CLINICAL NOTE AND INDICATIONS FOR SURGERY  74 yo male with multiple medical problems including ESRD, acute on chronic systolic and diastolic heart failure, diabetes, and severe symptomatic aortic stenosis presents for TAVR.  During the course of the patient's preoperative work up they have been evaluated comprehensively by a multidisciplinary team of specialists coordinated through the [facility] in the [facility].  They have been demonstrated to suffer from symptomatic severe aortic stenosis as noted above. The patient has been counseled extensively as to the relative risks and benefits of all options for the treatment of severe aortic stenosis including long term medical therapy, conventional surgery for aortic valve replacement, and transcatheter aortic valve replacement.  The patient has been independently evaluated by two cardiac surgeons including Dr Roxy Manns and Dr. Cyndia Bent, and they are felt to be at high risk for conventional surgical aortic valve replacement based upon a predicted risk of mortality using the Society of Thor[employer] Surgeons risk calculator of 9.5%. Both surgeons indicated the patient would be a poor candidate for conventional surgery  (predicted risk of mortality >15% and/or predicted risk of permanent morbidity >50%).   Based upon review of all of the patient's preoperative diagnostic tests they are felt to be candidate for transcatheter aortic valve replacement using the transfemoral approach as an alternative to high risk conventional surgery.    Following the decision to proceed with transcatheter aortic valve replacement, a discussion has been held regarding what types of management strategies would be attempted intraoperatively in the event of life-threatening complications, including whether or not the patient would be considered a candidate for the use of cardiopulmonary bypass and/or conversion to open sternotomy for attempted surgical intervention.  The patient has been advised of a variety of complications that might develop peculiar to this approach including but not limited to risks of death, stroke, paravalvular leak, aortic dissection or other major vascular complications, aortic annulus rupture, device embolization, cardiac rupture or perforation, acute myocardial infarction, arrhythmia, heart block or bradycardia requiring permanent pacemaker placement, congestive heart failure, respiratory failure, renal failure, pneumonia, infection, other late complications related to structural valve deterioration or migration, or other complications that might ultimately cause a temporary or permanent loss of functional independence or other long term morbidity.  The patient provides full informed consent for the procedure as described and all questions were answered preoperatively.    DETAILS OF THE OPERATIVE PROCEDURE  PREPARATION:    The patient is brought to the operating room on the above mentioned date and central monitoring was established by the anesthesia team including placement of Swan-Ganz catheter and radial arterial line. The patient is placed in the supine position on the operating table.  Intravenous antibiotics are  administered. General endotracheal anesthesia is induced uneventfully. A Foley catheter is placed.  Baseline  transesophageal echocardiogram was performed. The patient's chest, abdomen, both groins, and both lower extremities are prepared and draped in a sterile manner. A time out procedure is performed.   PERIPHERAL ACCESS:    Using the modified Seldinger technique, femoral arterial and venous access was obtained with placement of 6 Fr sheaths on the right side.  A pigtail diagnostic catheter was passed through the right femoral arterial sheath under fluoroscopic guidance into the aortic root.  A temporary transvenous pacemaker catheter was passed through the right femoral venous sheath under fluoroscopic guidance into the right ventricle.  The pacemaker was tested to ensure stable lead placement and pacemaker capture. Aortic root angiography was performed in order to determine the optimal angiographic angle for valve deployment.   TRANSFEMORAL ACCESS:   A left femoral arterial cutdown was performed by Dr Roxy Manns. Please see his separate operative note for details. The patient was heparinized systemically and ACT verified > 250 seconds.    A 16 Fr transfemoral E-sheath was introduced into the left femoral artery after progressively dilating over an Amplatz superstiff wire. An AL-2 catheter was used to direct a straight-tip exchange length wire across the native aortic valve into the left ventricle. This was exchanged out for a pigtail catheter and position was confirmed in the LV apex. Simultaneous LV and Ao pressures were recorded.  The pigtail catheter was then exchanged for an Amplatz Extra-stiff wire in the LV apex. At that point, BAV was performed using a 25 mm valvuloplasty balloon.  Once optimal position was achieved, BAV was done under rapid ventricular pacing at 180 bpm. The patient recovered well hemodynamically.   TRANSCATHETER HEART VALVE DEPLOYMENT:  An Edwards Sapien 3 THV (size 29 mm)  was prepared and crimped per manufacturer's guidelines, and the proper orientation of the valve is confirmed on the [employer] delivery system. The valve was advanced through the introducer sheath using normal technique until in an appropriate position in the abdominal aorta beyond the sheath tip. The balloon was then retracted and using the fine-tuning wheel was centered on the valve. The valve was then advanced across the aortic arch using appropriate flexion of the catheter. The valve was carefully positioned across the aortic valve annulus. The Commander catheter was retracted using normal technique. Once final position of the valve has been confirmed by angiographic assessment, the valve is deployed while temporarily holding ventilation and during rapid ventricular pacing to maintain systolic blood pressure < 50 mmHg and pulse pressure < 10 mmHg. The balloon inflation is held for >3 seconds after reaching full deployment volume. Once the balloon has fully deflated the balloon is retracted into the ascending aorta and valve function is assessed using TEE. There is felt to be trivial paravalvular leak and no central aortic insufficiency.  The patient's hemodynamic recovery following valve deployment is good.  The deployment balloon and guidewire are both removed. Echo demostrated acceptable post-procedural gradients, stable mitral valve function, and trivial AI.   PROCEDURE COMPLETION:  The sheath was then removed and arteriotomy repaired by Dr Roxy Manns. Please see his separate report for details.  The temporary pacemaker, pigtail catheters and femoral sheaths were removed with manual pressure used for hemostasis.   The patient tolerated the procedure well and is transported to the surgical intensive care in stable condition. There were no immediate intraoperative complications. All sponge instrument and needle counts are verified correct at completion of the operation.   No blood products were  administered during the operation.  The patient received a  total of 40 mL of intravenous contrast during the procedure.  [name] MD 08/02/2015 10:21 AM

## 2015-08-02 NOTE — Anesthesia Postprocedure Evaluation (Signed)
  Anesthesia Post-op Note  Patient: Jeffrey [name]  Procedure(s) Performed: Procedure(s): TRANSCATHETER AORTIC VALVE REPLACEMENT, TRANSFEMORAL (Left) TRANSESOPHAGEAL ECHOCARDIOGRAM (TEE) (N/A)  Patient Location: SICU  Anesthesia Type:General  Level of Consciousness: awake, alert , oriented and patient cooperative  Airway and Oxygen Therapy: Patient Spontanous Breathing  Post-op Pain: mild  Post-op Assessment: Post-op Vital signs reviewed, Patient's Cardiovascular Status Stable, Respiratory Function Stable, Patent Airway and No signs of Nausea or vomiting.  Patient up in chair.              Post-op Vital Signs: Reviewed and stable  Last Vitals:  Filed Vitals:   08/02/15 1500  BP:   Pulse:   Temp: 35.7 C  Resp:     Complications: No apparent anesthesia complications

## 2015-08-02 NOTE — Interval H&P Note (Signed)
History and Physical Interval Note:  08/02/2015 8:24 AM  Jeffrey [name]  has presented today for surgery, with the diagnosis of SEVERE AS  The various methods of treatment have been discussed with the patient and family. After consideration of risks, benefits and other options for treatment, the patient has consented to  Procedure(s): TRANSCATHETER AORTIC VALVE REPLACEMENT, TRANSFEMORAL (Left) TRANSESOPHAGEAL ECHOCARDIOGRAM (TEE) (N/A) as a surgical intervention .  The patient's history has been reviewed, patient examined, no change in status, stable for surgery.  I have reviewed the patient's chart and labs.  Questions were answered to the patient's satisfaction.     [name]

## 2015-08-02 NOTE — Anesthesia Procedure Notes (Addendum)
Anesthesia Procedure Note Central line insertion note. Skin prepped and draped in sterile fashion. Patent vessel identified on u/s using linear probe. Needle advanced under live u/s guidance with aspiration of blood upon entry into vessel. Catheter passed easily over finder needle. Wire passed easily through catheter and location confirmed with u/s. Image saved in chart. Introducer catheter advanced over wire, with aspiration of blood through all ports for confirmation. Line sutured and dressing applied. Pt tolerated well with no immediate complications.   Procedure Name: Intubation Date/Time: 08/02/2015 8:06 AM Performed by: Melina Copa, Lesta Limbert R Pre-anesthesia Checklist: Patient identified, Emergency Drugs available, Suction available, Patient being monitored and Timeout performed Patient Re-evaluated:Patient Re-evaluated prior to inductionOxygen Delivery Method: Circle system utilized Preoxygenation: Pre-oxygenation with 100% oxygen Intubation Type: IV induction Ventilation: Mask ventilation without difficulty Laryngoscope Size: Miller and 2 Tube type: Oral Tube size: 7.5 mm Number of attempts: 1 Airway Equipment and Method: Stylet and LTA kit utilized Placement Confirmation: ETT inserted through vocal cords under direct vision,  positive ETCO2 and breath sounds checked- equal and bilateral Secured at: 23 cm Tube secured with: Tape Dental Injury: Teeth and Oropharynx as per pre-operative assessment

## 2015-08-02 NOTE — Progress Notes (Addendum)
[facility] Progress Note   Subjective: Alert after TAVR procedure this am  Filed Vitals:   08/02/15 1400 08/02/15 1415 08/02/15 1430 08/02/15 1445  BP: 105/53     Pulse: 75 73 75 74  Temp: 96.3 F (35.7 C) 96.3 F (35.7 C) 96.3 F (35.7 C)   TempSrc:      Resp: 11 16 10 11   Height:      Weight:      SpO2: 98% 99% 100% 99%   Exam: Alert, no distress No jvd Chest clear bilat RRR no MRG Abd soft ntnd no ascites No LE edema LUA AVF +bruit Neuro is alert, Ox 3  MWF NW  4hr   2/2 bath  97.5kg   LUA AVF  Heparin 10K      Assessment: 1 Severe AS,. s/p TAVR procedure  2 ESRD HD wed 3 Vol is euvolemic 4 Anemia 5 HPTH 6 SP pacemaker 7 DM  Plan - HD tomorrow, minimal UF    Kelly Splinter MD  pager [phone]    cell [phone]  08/02/2015, 3:01 PM     Recent Labs Lab 07/29/15 1740 07/30/15 0307 08/01/15 0344 08/01/15 0809  08/02/15 0814 08/02/15 0908 08/02/15 0946 08/02/15 1039  NA 133* 134* 132* 130*  < > 131* 130* 131* 133*  K 5.4* 3.9 4.7 4.9  < > 4.1 4.3 4.1 4.2  CL 93* 91* 90* 90*  --  94* 96* 95*  --   CO2 19* 27 24 21*  --   --   --   --   --   GLUCOSE 168* 121* 140* 138*  < > 176* 172* 169* 135*  BUN 89* 47* 77* 81*  --  44* 43* 44*  --   CREATININE 10.47* 7.20* 9.48* 9.51*  --  6.00* 6.30* 6.20*  --   CALCIUM 8.9 8.7* 8.4* 8.3*  --   --   --   --   --   PHOS 9.8*  --   --  8.5*  --   --   --   --   --   < > = values in this interval not displayed.  Recent Labs Lab 07/29/15 0402  07/30/15 0307 08/01/15 0344 08/01/15 0809  AST 966*  --  358* 90*  --   ALT 1813*  --  1199* 606*  --   ALKPHOS 147*  --  134* 116  --   BILITOT 1.6*  --  1.5* 1.3*  --   PROT 6.7  --  6.0* 6.3*  --   ALBUMIN 3.2*  < > 2.9* 2.9* 2.9*  < > = values in this interval not displayed.  Recent Labs Lab 08/01/15 0344 08/01/15 0809  08/02/15 0946 08/02/15 1039 08/02/15 1040  WBC 10.0 10.8*  --   --   --  8.6  HGB 8.4* 8.7*  < > 8.2* 9.5* 7.9*  HCT  26.6* 26.7*  < > 24.0* 28.0* 25.1*  MCV 98.2 97.8  --   --   --  100.0  PLT 223 217  --   --   --  150  < > = values in this interval not displayed. [name] antiseptic oral rinse  7 mL Mouth Rinse BID  . aspirin  325 mg Oral Daily  . atorvastatin  40 mg Oral q1800  . calcium carbonate  3 tablet Oral TID WC  . cefUROXime (ZINACEF)  IV  1.5 g Intravenous Q12H  . [START ON 08/03/2015] clopidogrel  75 mg Oral Q breakfast  . darbepoetin (ARANESP) injection - DIALYSIS  100 mcg Intravenous Q Mon-HD  . famotidine (PEPCID) IV  20 mg Intravenous Q12H  . ferric gluconate (FERRLECIT/NULECIT) IV  62.5 mg Intravenous Q Fri-HD  . heparin  5,000 Units Subcutaneous 3 times per day  . insulin aspart  0-9 Units Subcutaneous TID WC  . metoprolol tartrate  12.5 mg Oral BID  . multivitamin  1 tablet Oral QHS  . pantoprazole  40 mg Oral Q0600  . phytonadione  10 mg Oral Daily  . sodium chloride  3 mL Intravenous Q12H  . vancomycin  1,000 mg Intravenous Once   . dexmedetomidine    . nitroGLYCERIN    . phenylephrine (NEO-SYNEPHRINE) Adult infusion     sodium chloride, acetaminophen **OR** [DISCONTINUED] acetaminophen, albumin human, albuterol, calcium carbonate, HYDROcodone-homatropine, lactated ringers, metoprolol, midazolam, morphine injection, morphine injection, nitroGLYCERIN, [DISCONTINUED] ondansetron **OR** ondansetron (ZOFRAN) IV, oxyCODONE, sodium chloride, traMADol

## 2015-08-02 NOTE — H&P (View-Only) (Signed)
    Subjective:  Pt doing fine this am. Seen in HD. No chest pain or shortness of breath. Had nausea yesterday around noon for one hour but felt ok remainder of day.  Objective:  Vital Signs in the last 24 hours: Temp:  [96.9 F (36.1 C)-97.9 F (36.6 C)] 96.9 F (36.1 C) (09/05 0730) Pulse Rate:  [59-85] 82 (09/05 1000) Resp:  [18-19] 19 (09/05 0730) BP: (95-107)/(47-65) 106/65 mmHg (09/05 1000) SpO2:  [94 %-99 %] 94 % (09/05 0730) Weight:  [206 lb 2.1 oz (93.5 kg)-208 lb 5.4 oz (94.5 kg)] 208 lb 5.4 oz (94.5 kg) (09/05 0730)  Intake/Output from previous day: 09/04 0701 - 09/05 0700 In: 110 [P.O.:755] Out: 0   Physical Exam: Pt is alert and oriented, chronically ill-appearing male in NAD HEENT: normal Neck: JVP - normal Lungs: CTA bilaterally CV: RRR with 3/6 harsh systolic murmur at the LSB Abd: soft, NT, Positive BS, no hepatomegaly Ext: no C/C/E, right groin with extensive ecchymoses, nontender Skin: warm/dry no rash   Lab Results:  Recent Labs  08/01/15 0344 08/01/15 0809  WBC 10.0 10.8*  HGB 8.4* 8.7*  PLT 223 217    Recent Labs  08/01/15 0344 08/01/15 0809  NA 132* 130*  K 4.7 4.9  CL 90* 90*  CO2 24 21*  GLUCOSE 140* 138*  BUN 77* 81*  CREATININE 9.48* 9.51*   No results for input(s): TROPONINI in the last 72 hours.  Invalid input(s): CK, MB  Assessment/Plan:  1. Acute on chronic diastolic heart failure 2. Severe Aortic stenosis (Stage D) 3. ESRD 4. Transaminitis, improved 5. Coagulopathy, improved with INR 1.3 today 6. Anemia, chronic disease/ESRD 7. Diabetes, Type II 8 Paroxysmal AF - not felt to be a candidate for anticoagulation in past. Currently on ASA alone. Will have to decide on strategy post-TAVR 9. CAD - stable at recent cath with chronic occlusion of dominant RCA, moderate LAD stenosis.   Plan for TAVR tomorrow. All data reviewed (CT's, Cath, echo, lab data). Anticipate left transfemoral approach, 26 mm Sapien 3 transcatheter  valve. The patient is marginal but clinically stable. I don't think delaying TAVR will result in significant clinical improvement, but do have concerns about his multiple chronic medical problems. Hopeful with treatment of his severe aortic stenosis, he will tolerate hemodialysis better from a hemodynamic standpoint.   Following the decision to proceed with transcatheter aortic valve replacement, a discussion has been held regarding what types of management strategies would be attempted intraoperatively in the event of life-threatening complications, including whether or not the patient would be considered a candidate for the use of cardiopulmonary bypass and/or conversion to open sternotomy for attempted surgical intervention.  The patient has been advised of a variety of complications that might develop including but not limited to risks of death, stroke, paravalvular leak, aortic dissection or other major vascular complications, aortic annulus rupture, device embolization, cardiac rupture or perforation, mitral regurgitation, acute myocardial infarction, arrhythmia, heart block or bradycardia requiring permanent pacemaker placement, congestive heart failure, respiratory failure, renal failure, pneumonia, infection, other late complications related to structural valve deterioration or migration, or other complications that might ultimately cause a temporary or permanent loss of functional independence or other long term morbidity.  The patient provides full informed consent for the procedure as described and all questions were answered.  [name], M.D. 08/01/2015, 10:36 AM

## 2015-08-03 ENCOUNTER — Encounter (HOSPITAL_COMMUNITY): Payer: Self-pay | Admitting: Cardiovascular Disease

## 2015-08-03 ENCOUNTER — Inpatient Hospital Stay (HOSPITAL_COMMUNITY): Payer: Medicare Other

## 2015-08-03 LAB — BASIC METABOLIC PANEL
ANION GAP: 14 (ref 5–15)
BUN: 53 mg/dL — ABNORMAL HIGH (ref 6–20)
CALCIUM: 8.3 mg/dL — AB (ref 8.9–10.3)
CO2: 24 mmol/L (ref 22–32)
Chloride: 91 mmol/L — ABNORMAL LOW (ref 101–111)
Creatinine, Ser: 7.24 mg/dL — ABNORMAL HIGH (ref 0.61–1.24)
GFR, EST AFRICAN AMERICAN: 8 mL/min — AB (ref >60)
GFR, EST NON AFRICAN AMERICAN: 6 mL/min — AB (ref >60)
Glucose, Bld: 126 mg/dL — ABNORMAL HIGH (ref 65–99)
POTASSIUM: 4.5 mmol/L (ref 3.5–5.1)
SODIUM: 129 mmol/L — AB (ref 135–145)

## 2015-08-03 LAB — GLUCOSE, CAPILLARY
GLUCOSE-CAPILLARY: 144 mg/dL — AB (ref 65–99)
Glucose-Capillary: 204 mg/dL — ABNORMAL HIGH (ref 65–99)
Glucose-Capillary: 98 mg/dL (ref 65–99)
Glucose-Capillary: 192 mg/dL — ABNORMAL HIGH (ref 65–99)

## 2015-08-03 LAB — CBC
HCT: 28 % — ABNORMAL LOW (ref 39.0–52.0)
Hemoglobin: 8.7 g/dL — ABNORMAL LOW (ref 13.0–17.0)
MCH: 31.2 pg (ref 26.0–34.0)
MCHC: 31.1 g/dL (ref 30.0–36.0)
MCV: 100.4 fL — AB (ref 78.0–100.0)
PLATELETS: 149 10*3/uL — AB (ref 150–400)
RBC: 2.79 MIL/uL — AB (ref 4.22–5.81)
RDW: 21.6 % — AB (ref 11.5–15.5)
WBC: 7.3 10*3/uL (ref 4.0–10.5)

## 2015-08-03 LAB — MAGNESIUM: MAGNESIUM: 2 mg/dL (ref 1.7–2.4)

## 2015-08-03 MED ORDER — ASPIRIN 81 MG PO CHEW
81.0000 mg | CHEWABLE_TABLET | Freq: Every day | ORAL | Status: DC
  Administered 2015-08-04 – 2015-08-05 (×2): 81 mg via ORAL
  Filled 2015-08-03: qty 1

## 2015-08-03 MED ORDER — INSULIN DETEMIR 100 UNIT/ML ~~LOC~~ SOLN
10.0000 [IU] | Freq: Every day | SUBCUTANEOUS | Status: DC
  Administered 2015-08-03 – 2015-08-05 (×3): 10 [IU] via SUBCUTANEOUS
  Filled 2015-08-03 (×4): qty 0.1

## 2015-08-03 NOTE — Progress Notes (Signed)
[address]       [place] [address]             [phone]        CARDIOTHORACIC SURGERY PROGRESS NOTE   R1 Day Post-Op Procedure(s) (LRB): TRANSCATHETER AORTIC VALVE REPLACEMENT, TRANSFEMORAL (Left) TRANSESOPHAGEAL ECHOCARDIOGRAM (TEE) (N/A)  Subjective: Looks great.  Feels well other than mild soreness in left groin.  States that he feels [facility] BETTER than prior to surgery  Objective: Vital signs: BP Readings from Last 1 Encounters:  08/03/15 108/46   Pulse Readings from Last 1 Encounters:  08/03/15 72   Resp Readings from Last 1 Encounters:  08/03/15 26   Temp Readings from Last 1 Encounters:  08/03/15 97.8 F (36.6 C) Oral    Hemodynamics: PAP: (42-56)/(24-34) 47/30 mmHg  Physical Exam:  Rhythm:   paced  Breath sounds: clear  Heart sounds:  RRR w/out murmur  Incisions:  Clean and dry  Abdomen:  Soft, non-distended, non-tender  Extremities:  Warm, well-perfused    Intake/Output from previous day: 09/06 0701 - 09/07 0700 In: 2117.4 [P.O.:360; I.V.:1107.4; IV Piggyback:650] Out: [id number] [Urine:425; Stool:400; Blood:50] Intake/Output this shift:    Lab Results:  CBC: Recent Labs  08/02/15 1630 08/02/15 1631 08/03/15 0510  WBC 6.7  --  7.3  HGB 8.4* 9.5* 8.7*  HCT 26.9* 28.0* 28.0*  PLT 150  --  149*    BMET:  Recent Labs  08/01/15 0809  08/02/15 1631 08/03/15 0510  NA 130*  < > 131* 129*  K 4.9  < > 4.4 4.5  CL 90*  < > 95* 91*  CO2 21*  --   --  24  GLUCOSE 138*  < > 132* 126*  BUN 81*  < > 44* 53*  CREATININE 9.51*  < > 6.80* 7.24*  CALCIUM 8.3*  --   --  8.3*  < > = values in this interval not displayed.   PT/INR:   Recent Labs  08/02/15 1040  LABPROT 18.8*  INR 1.57*    CBG (last 3)   Recent Labs  08/02/15 1627 08/02/15 2159 08/03/15 0751  GLUCAP 117* 207* 144*    ABG    Component Value Date/Time   PHART 7.432 08/02/2015 1039   PCO2ART 33.3* 08/02/2015 1039   PO2ART 56.0* 08/02/2015 1039     HCO3 22.4 08/02/2015 1039   TCO2 23 08/02/2015 1631   ACIDBASEDEF 2.0 08/02/2015 1039   O2SAT 91.0 08/02/2015 1039    CXR:  PORTABLE CHEST - 1 VIEW  COMPARISON: 08/02/2015.  FINDINGS: Interim removal Swan-Ganz catheter. Right IJ sheath in stable position Cardiac pacer stable position. Aortic valve replacement. Stable cardiomegaly. Mild pulmonary venous congestion. Interim clearing of mild pulmonary interstitial edema. Bibasilar subsegmental atelectasis. No significant pleural effusion. No pneumothorax.  IMPRESSION: 1. Interim removal of Swan-Ganz catheter. Right IJ sheath in stable position. 2. Cardiac pacer in stable position. Prior aortic valve replacement. Stable cardiomegaly and pulmonary venous congestion. Interim clearing of pulmonary interstitial edema. No pleural effusion. 3. Mild bibasilar subsegmental atelectasis.   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   Rexene Alberts 08/03/2015 8:21 AM

## 2015-08-03 NOTE — Progress Notes (Signed)
[facility] Progress Note   Subjective: Alert , going to floor bed this am  Filed Vitals:   08/03/15 0800 08/03/15 0900 08/03/15 1000 08/03/15 1101  BP: 115/33 111/45 110/72 116/61  Pulse: 76 79 78 79  Temp:    97.6 F (36.4 C)  TempSrc:    Oral  Resp: 23 18 18 18   Height:      Weight:      SpO2: 95% 95% 96% 99%   Exam: Alert, no distress No jvd Chest clear bilat RRR no MRG Abd soft ntnd no ascites No LE edema LUA AVF +bruit Neuro is alert, Ox 3  MWF NW  4hr   2/2 bath  97.5kg   LUA AVF  Heparin 10K      Assessment: 1 Severe AS, s/p TAVR 2 ESRD HD wed 3 Vol is up 2-3kg 4 Anemia 5 HPTH 6 SP pacemaker 7 DM  Plan - HD today    Rob Jonnie Finner MD  pager [phone]    cell [phone]  08/03/2015, 11:57 AM     Recent Labs Lab 07/29/15 1740  08/01/15 0344 08/01/15 0809  08/02/15 0946 08/02/15 1039 08/02/15 1630 08/02/15 1631 08/03/15 0510  NA 133*  < > 132* 130*  < > 131* 133*  --  131* 129*  K 5.4*  < > 4.7 4.9  < > 4.1 4.2  --  4.4 4.5  CL 93*  < > 90* 90*  < > 95*  --   --  95* 91*  CO2 19*  < > 24 21*  --   --   --   --   --  24  GLUCOSE 168*  < > 140* 138*  < > 169* 135*  --  132* 126*  BUN 89*  < > 77* 81*  < > 44*  --   --  44* 53*  CREATININE 10.47*  < > 9.48* 9.51*  < > 6.20*  --  6.84* 6.80* 7.24*  CALCIUM 8.9  < > 8.4* 8.3*  --   --   --   --   --  8.3*  PHOS 9.8*  --   --  8.5*  --   --   --   --   --   --   < > = values in this interval not displayed.  Recent Labs Lab 07/29/15 0402  07/30/15 0307 08/01/15 0344 08/01/15 0809  AST 966*  --  358* 90*  --   ALT 1813*  --  1199* 606*  --   ALKPHOS 147*  --  134* 116  --   BILITOT 1.6*  --  1.5* 1.3*  --   PROT 6.7  --  6.0* 6.3*  --   ALBUMIN 3.2*  < > 2.9* 2.9* 2.9*  < > = values in this interval not displayed.  Recent Labs Lab 08/02/15 1040 08/02/15 1630 08/02/15 1631 08/03/15 0510  WBC 8.6 6.7  --  7.3  HGB 7.9* 8.4* 9.5* 8.7*  HCT 25.1* 26.9* 28.0* 28.0*  MCV  100.0 100.4*  --  100.4*  PLT 150 150  --  149*   . antiseptic oral rinse  7 mL Mouth Rinse BID  . aspirin  81 mg Oral Daily  . atorvastatin  40 mg Oral q1800  . calcium carbonate  3 tablet Oral TID WC  . cefUROXime (ZINACEF)  IV  1.5 g Intravenous Q12H  . clopidogrel  75 mg Oral Q breakfast  . darbepoetin (  ARANESP) injection - DIALYSIS  100 mcg Intravenous Q Mon-HD  . ferric gluconate (FERRLECIT/NULECIT) IV  62.5 mg Intravenous Q Fri-HD  . heparin  5,000 Units Subcutaneous 3 times per day  . insulin aspart  0-9 Units Subcutaneous TID WC  . insulin detemir  10 Units Subcutaneous Daily  . metoprolol tartrate  12.5 mg Oral BID  . multivitamin  1 tablet Oral QHS  . pantoprazole  40 mg Oral Q0600  . sodium chloride  3 mL Intravenous Q12H     sodium chloride, acetaminophen **OR** [DISCONTINUED] acetaminophen, albuterol, calcium carbonate, HYDROcodone-homatropine, metoprolol, nitroGLYCERIN, [DISCONTINUED] ondansetron **OR** ondansetron (ZOFRAN) IV, oxyCODONE, sodium chloride, traMADol

## 2015-08-03 NOTE — Care Management Note (Signed)
Case Management Note  Patient Details  Name: Jeffrey [name] MRN: [id number] Date of Birth: [birth year]  Subjective/Objective:       Post op TAVR on 9-06.  Looks great.  Has already taken laps walking around unit.  Wife at bedside.  Prior to admission was home with wife and son, independent.  Plan is to go home with wife, and son who will be with him 24/7.  Has been using RW with ambulation, may need one on discharge.  Will need order if needs prior to discharge.              Action/Plan:   Expected Discharge Date:                  Expected Discharge Plan:  Home/Self Care  In-House Referral:     Discharge planning Services  CM Consult  Post Acute Care Choice:    Choice offered to:     DME Arranged:    DME Agency:     HH Arranged:    HH Agency:     Status of Service:  In process, will continue to follow  Medicare Important Message Given:  Yes-fourth notification given Date Medicare IM Given:    Medicare IM give by:    Date Additional Medicare IM Given:    Additional Medicare Important Message give by:     If discussed at [name] of Stay Meetings, dates discussed:    Additional Comments:  Vergie Living, RN 08/03/2015, 10:51 AM

## 2015-08-03 NOTE — Progress Notes (Addendum)
    Subjective:  C/o left groin pain after walking this morning, otherwise feels well. No CP or dyspnea.   Objective:  Vital Signs in the last 24 hours: Temp:  [96.3 F (35.7 C)-97.6 F (36.4 C)] 97.6 F (36.4 C) (09/07 0353) Pulse Rate:  [64-87] 72 (09/07 0700) Resp:  [9-26] 26 (09/07 0700) BP: (101-119)/(46-69) 108/46 mmHg (09/07 0700) SpO2:  [94 %-100 %] 98 % (09/07 0700) Arterial Line BP: (128-147)/(46-58) 132/50 mmHg (09/06 1430) Weight:  [210 lb 1.6 oz (95.3 kg)] 210 lb 1.6 oz (95.3 kg) (09/07 0600)  Intake/Output from previous day: 09/06 0701 - 09/07 0700 In: 2117.4 [P.O.:360; I.V.:1107.4; IV Piggyback:650] Out: 875 [Urine:425; Stool:400; Blood:50]  Physical Exam: Pt is alert and oriented, elderly male in NAD HEENT: normal Neck: JVP - normal Lungs: Coarse bilaterally CV: RRR without murmur or gallop Abd: soft, NT Ext: no C/C/E, stable ecchymoses on right, left groin incision clear Skin: warm/dry no rash   Lab Results:  Recent Labs  08/02/15 1630 08/02/15 1631 08/03/15 0510  WBC 6.7  --  7.3  HGB 8.4* 9.5* 8.7*  PLT 150  --  149*    Recent Labs  08/01/15 0809  08/02/15 1631 08/03/15 0510  NA 130*  < > 131* 129*  K 4.9  < > 4.4 4.5  CL 90*  < > 95* 91*  CO2 21*  --   --  24  GLUCOSE 138*  < > 132* 126*  BUN 81*  < > 44* 53*  CREATININE 9.51*  < > 6.80* 7.24*  < > = values in this interval not displayed. No results for input(s): TROPONINI in the last 72 hours.  Invalid input(s): CK, MB  Tele: A-sensed V-paced rhythm  CXR: IMPRESSION: 1. Interim removal of Swan-Ganz catheter. Right IJ sheath in stable position. 2. Cardiac pacer in stable position. Prior aortic valve replacement. Stable cardiomegaly and pulmonary venous congestion. Interim clearing of pulmonary interstitial edema. No pleural effusion. 3. Mild bibasilar subsegmental atelectasis.  Assessment/Plan:  1. Acute on chronic diastolic CHF, improved 2. Severe aortic stenosis s/p  TAVR POD#1 3. ESRD 4. Type II DM 5. PAF - maintaining sinus rhythm, deemed poor candidate for anticoagulation in past 6. CAD without angina 7. Anemia - stable 8. Transaminitis - improved  Progressing very well. H/H stable, interstitial edema clearing. For HD today. Will tx to 2W Stepdown today. Start plavix 75 mg daily. Check 2D Echo. Add Levemir 10 units daily. Stop Vit K and repeat INR tomorrow.  [name], M.D. 08/03/2015, 7:40 AM

## 2015-08-03 NOTE — Procedures (Signed)
Patient was seen on dialysis and the procedure was supervised.  BFR 350  Via AVF BP is  102/65.   Patient appears to be tolerating treatment well  Jeffrey [name] A 08/03/2015

## 2015-08-03 NOTE — Progress Notes (Signed)
Pt transferred to [address] via wheelchair. Transferred to [place] with RN Jen Mow at bedside. No complications.  Eleonore Chiquito Rn [place]

## 2015-08-03 NOTE — Progress Notes (Signed)
Received patient from [address] [place] @ 11:00am   Alert & oriented, up in recliner, vss  B/p 116/61  On room air O2  99%.      Peripheral  IV sites  X 2  NSL  In right arm.  Mervyn Skeeters, RN

## 2015-08-03 NOTE — Plan of Care (Signed)
Problem: Phase I Progression Outcomes Goal: Voiding-avoid urinary catheter unless indicated Outcome: Completed/Met Date Met:  08/03/15 Pt oliguric, foley d/ced previous shift

## 2015-08-04 DIAGNOSIS — Z954 Presence of other heart-valve replacement: Secondary | ICD-10-CM

## 2015-08-04 LAB — GLUCOSE, CAPILLARY
GLUCOSE-CAPILLARY: 129 mg/dL — AB (ref 65–99)
GLUCOSE-CAPILLARY: 137 mg/dL — AB (ref 65–99)
Glucose-Capillary: 131 mg/dL — ABNORMAL HIGH (ref 65–99)
Glucose-Capillary: 100 mg/dL — ABNORMAL HIGH (ref 65–99)

## 2015-08-04 LAB — PROTIME-INR
INR: 1.38 (ref 0.00–1.49)
Prothrombin Time: 17.1 seconds — ABNORMAL HIGH (ref 11.6–15.2)

## 2015-08-04 NOTE — Progress Notes (Signed)
    Subjective:  C/o left groin pain with walking; no CP or dyspnea.  Objective:  Vital Signs in the last 24 hours: Temp:  [97.5 F (36.4 C)-98.4 F (36.9 C)] 98.2 F (36.8 C) (09/08 0543) Pulse Rate:  [76-84] 77 (09/08 0959) Resp:  [11-20] 18 (09/08 0543) BP: (91-116)/(48-85) 113/61 mmHg (09/08 0959) SpO2:  [90 %-99 %] 98 % (09/08 0543) Weight:  [204 lb (92.534 kg)-212 lb 4.9 oz (96.3 kg)] 204 lb (92.534 kg) (09/08 0543)  Intake/Output from previous day: 09/07 0701 - 09/08 0700 In: 10 [I.V.:10] Out: 2493   Physical Exam: Pt is alert and oriented, elderly male in NAD HEENT: normal Neck: supple Lungs: Diminished BS bases CV: RRR 1/6 systolic murmur LSB; no diastolic murmur Abd: soft, NT, no masses Ext: trace edema; stable ecchymoses on right, left groin incision clear Skin: warm/dry no rash Neuro: grossly intact   Lab Results:  Recent Labs  08/02/15 1630 08/02/15 1631 08/03/15 0510  WBC 6.7  --  7.3  HGB 8.4* 9.5* 8.7*  PLT 150  --  149*    Recent Labs  08/02/15 1631 08/03/15 0510  NA 131* 129*  K 4.4 4.5  CL 95* 91*  CO2  --  24  GLUCOSE 132* 126*  BUN 44* 53*  CREATININE 6.80* 7.24*    Tele: V-paced rhythm  CXR: IMPRESSION: 1. Interim removal of Swan-Ganz catheter. Right IJ sheath in stable position. 2. Cardiac pacer in stable position. Prior aortic valve replacement. Stable cardiomegaly and pulmonary venous congestion. Interim clearing of pulmonary interstitial edema. No pleural effusion. 3. Mild bibasilar subsegmental atelectasis.  Assessment/Plan:  1. Acute on chronic diastolic CHF, improved 2. Severe aortic stenosis s/p TAVR POD#2 3. ESRD 4. Type II DM 5. PAF - maintaining sinus rhythm, deemed poor candidate for anticoagulation in past 6. CAD without angina 7. Anemia - stable 8. Transaminitis - improving  Progressing very well. Continue ASA and plavix. Check 2D Echo today s/p TAVR. Plan dialysis in AM and if stable, DC with outpt  fu with Dr Burt Knack. Will need LFTs rechecked as outpt.  Kirk Ruths, M.D. 08/04/2015, 10:48 AM

## 2015-08-04 NOTE — Progress Notes (Addendum)
[address]       [place] [address]             [phone]          2 Days Post-Op Procedure(s) (LRB): TRANSCATHETER AORTIC VALVE REPLACEMENT, TRANSFEMORAL (Left) TRANSESOPHAGEAL ECHOCARDIOGRAM (TEE) (N/A)  Subjective: C/o "burning pain" in groin incision when he gets up and moving, otherwise feels "better than I have been".   Objective: Vital signs in last 24 hours: Patient Vitals for the past 24 hrs:  BP Temp Temp src Pulse Resp SpO2 Weight  08/04/15 0543 107/62 mmHg 98.2 F (36.8 C) Oral 76 18 98 % 204 lb (92.534 kg)  08/03/15 2206 (!) 99/51 mmHg 98.4 F (36.9 C) Oral 84 - 90 % -  08/03/15 1723 (!) 107/53 mmHg 97.8 F (36.6 C) Oral 79 11 97 % 206 lb 12.7 oz (93.8 kg)  08/03/15 1700 (!) 101/51 mmHg - - 80 - - -  08/03/15 1600 (!) 94/49 mmHg - - 80 - - -  08/03/15 1530 (!) 99/52 mmHg - - 76 - - -  08/03/15 1500 (!) 92/52 mmHg - - 77 - - -  08/03/15 1430 (!) 94/48 mmHg - - 76 - - -  08/03/15 1400 (!) 91/52 mmHg - - 77 - - -  08/03/15 1346 102/65 mmHg - - 77 - - -  08/03/15 1335 111/85 mmHg 97.5 F (36.4 C) Oral 77 20 99 % 212 lb 4.9 oz (96.3 kg)  08/03/15 1101 116/61 mmHg 97.6 F (36.4 C) Oral 79 18 99 % -  08/03/15 1000 110/72 mmHg - - 78 18 96 % -  08/03/15 0900 (!) 111/45 mmHg - - 79 18 95 % -   Current Weight  08/04/15 204 lb (92.534 kg)   CBGs 192-129  Intake/Output from previous day: 09/07 0701 - 09/08 0700 In: 10 [I.V.:10] Out: 2493     PHYSICAL EXAM:  Heart: RRR, paced Lungs: Clear Wound: L Groin with ecchymosis, but incision clean and dry Extremities: No significant LE edema    Lab Results: CBC: Recent Labs  08/02/15 1630 08/02/15 1631 08/03/15 0510  WBC 6.7  --  7.3  HGB 8.4* 9.5* 8.7*  HCT 26.9* 28.0* 28.0*  PLT 150  --  149*   BMET:  Recent Labs  08/02/15 1631 08/03/15 0510  NA 131* 129*  K 4.4 4.5  CL 95* 91*  CO2  --  24  GLUCOSE 132* 126*  BUN 44* 53*  CREATININE 6.80* 7.24*  CALCIUM  --   8.3*    PT/INR:  Recent Labs  08/02/15 1040  LABPROT 18.8*  INR 1.57*      Assessment/Plan: S/P Procedure(s) (LRB): TRANSCATHETER AORTIC VALVE REPLACEMENT, TRANSFEMORAL (Left) TRANSESOPHAGEAL ECHOCARDIOGRAM (TEE) (N/A)  CV- BPs low normal but stable, SR/paced. Did not get echo yesterday, likely due to being on dialysis. Will follow up 2D echo today. Continue Plavix/ASA.  ESRD- Continue HD per renal service.  DM- Levemir restarted, titrate dose prn.  Continue ambulation, pulm toilet, routine post-TAVR care.  Disp- home per cardiology.   LOS: 17 days    COLLINS,GINA H 08/04/2015  I have seen and examined the patient and agree with the assessment and plan as outlined.  Looks really good.  Await f/u ECHO.  Possibly ready for d/c home after HD tomorrow.  I spent in excess of 15 minutes during the conduct of this hospital encounter and >50% of this time involved direct face-to-face encounter  with the patient for counseling and/or coordination of their care.   Rexene Alberts 08/04/2015 8:55 AM

## 2015-08-04 NOTE — Care Management Important Message (Signed)
Important Message  Patient Details  Name: COLIE CASTOR MRN: [id number] Date of Birth: 12-07-1940   Medicare Important Message Given:  [facility] notification given    Nathen May 08/04/2015, 9:54 AMImportant Message  Patient Details  Name: NYQUAN PURSIFULL MRN: [id number] Date of Birth: 04-Mar-1941   Medicare Important Message Given:  Yes-second notification given    Nathen May 08/04/2015, 9:53 AM

## 2015-08-04 NOTE — Progress Notes (Signed)
[facility] Progress Note   Subjective: up in chair  Filed Vitals:   08/03/15 1723 08/03/15 2206 08/04/15 0543 08/04/15 0959  BP: 107/53 99/51 107/62 113/61  Pulse: 79 84 76 77  Temp: 97.8 F (36.6 C) 98.4 F (36.9 C) 98.2 F (36.8 C)   TempSrc: Oral Oral Oral   Resp: 11  18   Height:      Weight: 93.8 kg (206 lb 12.7 oz)  92.534 kg (204 lb)   SpO2: 97% 90% 98%    Exam: Alert, no distress No jvd Chest clear bilat RRR no MRG Abd soft ntnd no ascites No LE edema LUA AVF +bruit Neuro is alert, Ox 3  MWF NW  4hr   2/2 bath  97.5kg   LUA AVF  Heparin 10K      Assessment: 1 Severe AS, s/p TAVR 2 ESRD HD friday 3 Vol below dry wt 4 Anemia 5 HPTH 6 SP pacemaker 7 DM  Plan - HD Friday first shift, poss dc tomorrow after HD    Kelly Splinter MD  pager [phone]    cell [phone]  08/04/2015, 12:30 PM     Recent Labs Lab 07/29/15 1740  08/01/15 0344 08/01/15 0809  08/02/15 0946 08/02/15 1039 08/02/15 1630 08/02/15 1631 08/03/15 0510  NA 133*  < > 132* 130*  < > 131* 133*  --  131* 129*  K 5.4*  < > 4.7 4.9  < > 4.1 4.2  --  4.4 4.5  CL 93*  < > 90* 90*  < > 95*  --   --  95* 91*  CO2 19*  < > 24 21*  --   --   --   --   --  24  GLUCOSE 168*  < > 140* 138*  < > 169* 135*  --  132* 126*  BUN 89*  < > 77* 81*  < > 44*  --   --  44* 53*  CREATININE 10.47*  < > 9.48* 9.51*  < > 6.20*  --  6.84* 6.80* 7.24*  CALCIUM 8.9  < > 8.4* 8.3*  --   --   --   --   --  8.3*  PHOS 9.8*  --   --  8.5*  --   --   --   --   --   --   < > = values in this interval not displayed.  Recent Labs Lab 07/29/15 0402  07/30/15 0307 08/01/15 0344 08/01/15 0809  AST 966*  --  358* 90*  --   ALT 1813*  --  1199* 606*  --   ALKPHOS 147*  --  134* 116  --   BILITOT 1.6*  --  1.5* 1.3*  --   PROT 6.7  --  6.0* 6.3*  --   ALBUMIN 3.2*  < > 2.9* 2.9* 2.9*  < > = values in this interval not displayed.  Recent Labs Lab 08/02/15 1040 08/02/15 1630 08/02/15 1631  08/03/15 0510  WBC 8.6 6.7  --  7.3  HGB 7.9* 8.4* 9.5* 8.7*  HCT 25.1* 26.9* 28.0* 28.0*  MCV 100.0 100.4*  --  100.4*  PLT 150 150  --  149*   . antiseptic oral rinse  7 mL Mouth Rinse BID  . aspirin  81 mg Oral Daily  . atorvastatin  40 mg Oral q1800  . calcium carbonate  3 tablet Oral TID WC  . clopidogrel  75 mg Oral  Q breakfast  . darbepoetin (ARANESP) injection - DIALYSIS  100 mcg Intravenous Q Mon-HD  . ferric gluconate (FERRLECIT/NULECIT) IV  62.5 mg Intravenous Q Fri-HD  . heparin  5,000 Units Subcutaneous 3 times per day  . insulin aspart  0-9 Units Subcutaneous TID WC  . insulin detemir  10 Units Subcutaneous Daily  . metoprolol tartrate  12.5 mg Oral BID  . multivitamin  1 tablet Oral QHS  . pantoprazole  40 mg Oral Q0600  . sodium chloride  3 mL Intravenous Q12H     sodium chloride, acetaminophen **OR** [DISCONTINUED] acetaminophen, albuterol, calcium carbonate, HYDROcodone-homatropine, metoprolol, nitroGLYCERIN, [DISCONTINUED] ondansetron **OR** ondansetron (ZOFRAN) IV, oxyCODONE, sodium chloride, traMADol

## 2015-08-04 NOTE — Progress Notes (Signed)
08/04/2015 5:37 PM Encouraged pt again to walk.  Pt states he just walked to the bathroom and he is "give out."  Does not want to walk at this time.  Asked to call us when he was ready. [place]

## 2015-08-04 NOTE — Progress Notes (Signed)
08/04/2015 1400 Encouraged pt to walk.  Pt does not want to walk at this time, he is "hurting too much."  Pt medicated for the pain, will attempt to ambulate again when pain under control. [place]

## 2015-08-05 ENCOUNTER — Inpatient Hospital Stay (HOSPITAL_COMMUNITY): Payer: Medicare Other

## 2015-08-05 ENCOUNTER — Other Ambulatory Visit (HOSPITAL_COMMUNITY): Payer: Medicare Other

## 2015-08-05 ENCOUNTER — Other Ambulatory Visit: Payer: Self-pay | Admitting: Physician Assistant

## 2015-08-05 DIAGNOSIS — I350 Nonrheumatic aortic (valve) stenosis: Secondary | ICD-10-CM

## 2015-08-05 DIAGNOSIS — I340 Nonrheumatic mitral (valve) insufficiency: Secondary | ICD-10-CM

## 2015-08-05 LAB — RENAL FUNCTION PANEL
Albumin: 2.4 g/dL — ABNORMAL LOW (ref 3.5–5.0)
Anion gap: 14 (ref 5–15)
BUN: 40 mg/dL — AB (ref 6–20)
CALCIUM: 7.7 mg/dL — AB (ref 8.9–10.3)
CHLORIDE: 92 mmol/L — AB (ref 101–111)
CO2: 23 mmol/L (ref 22–32)
CREATININE: 6.11 mg/dL — AB (ref 0.61–1.24)
GFR, EST AFRICAN AMERICAN: 9 mL/min — AB (ref >60)
GFR, EST NON AFRICAN AMERICAN: 8 mL/min — AB (ref >60)
Glucose, Bld: 105 mg/dL — ABNORMAL HIGH (ref 65–99)
Phosphorus: 6.5 mg/dL — ABNORMAL HIGH (ref 2.5–4.6)
Potassium: 4.2 mmol/L (ref 3.5–5.1)
SODIUM: 129 mmol/L — AB (ref 135–145)

## 2015-08-05 LAB — CBC
HEMATOCRIT: 28.1 % — AB (ref 39.0–52.0)
HEMOGLOBIN: 8.9 g/dL — AB (ref 13.0–17.0)
MCH: 32.1 pg (ref 26.0–34.0)
MCHC: 31.7 g/dL (ref 30.0–36.0)
MCV: 101.4 fL — AB (ref 78.0–100.0)
Platelets: 105 10*3/uL — ABNORMAL LOW (ref 150–400)
RBC: 2.77 MIL/uL — AB (ref 4.22–5.81)
RDW: 21.8 % — ABNORMAL HIGH (ref 11.5–15.5)
WBC: 8.1 10*3/uL (ref 4.0–10.5)

## 2015-08-05 LAB — TYPE AND SCREEN
ABO/RH(D): O NEG
Antibody Screen: NEGATIVE
UNIT DIVISION: 0
Unit division: 0

## 2015-08-05 LAB — CBC WITH DIFFERENTIAL/PLATELET
BASOS ABS: 0 10*3/uL (ref 0.0–0.1)
Basophils Relative: 0 % (ref 0–1)
EOS ABS: 0.3 10*3/uL (ref 0.0–0.7)
Eosinophils Relative: 3 % (ref 0–5)
HCT: 27.3 % — ABNORMAL LOW (ref 39.0–52.0)
Hemoglobin: 8.5 g/dL — ABNORMAL LOW (ref 13.0–17.0)
LYMPHS ABS: 0.7 10*3/uL (ref 0.7–4.0)
Lymphocytes Relative: 9 % — ABNORMAL LOW (ref 12–46)
MCH: 31.3 pg (ref 26.0–34.0)
MCHC: 31.1 g/dL (ref 30.0–36.0)
MCV: 100.4 fL — AB (ref 78.0–100.0)
MONO ABS: 1.2 10*3/uL — AB (ref 0.1–1.0)
Monocytes Relative: 14 % — ABNORMAL HIGH (ref 3–12)
NEUTROS PCT: 74 % (ref 43–77)
Neutro Abs: 6.1 10*3/uL (ref 1.7–7.7)
PLATELETS: 112 10*3/uL — AB (ref 150–400)
RBC: 2.72 MIL/uL — AB (ref 4.22–5.81)
RDW: 21.5 % — AB (ref 11.5–15.5)
WBC: 8.3 10*3/uL (ref 4.0–10.5)

## 2015-08-05 LAB — GLUCOSE, CAPILLARY
GLUCOSE-CAPILLARY: 89 mg/dL (ref 65–99)
Glucose-Capillary: 101 mg/dL — ABNORMAL HIGH (ref 65–99)
Glucose-Capillary: 104 mg/dL — ABNORMAL HIGH (ref 65–99)

## 2015-08-05 MED ORDER — NEPRO/CARBSTEADY PO LIQD
237.0000 mL | Freq: Two times a day (BID) | ORAL | Status: DC
  Filled 2015-08-05 (×3): qty 237

## 2015-08-05 MED ORDER — PANTOPRAZOLE SODIUM 40 MG PO TBEC: 40.0000 mg | DELAYED_RELEASE_TABLET | Freq: Every day | ORAL | Status: DC

## 2015-08-05 MED ORDER — OXYCODONE-ACETAMINOPHEN 5-325 MG PO TABS: 1.0000 | ORAL_TABLET | Freq: Four times a day (QID) | ORAL | Status: DC | PRN

## 2015-08-05 MED ORDER — METOPROLOL TARTRATE 25 MG PO TABS: 12.5000 mg | ORAL_TABLET | Freq: Two times a day (BID) | ORAL | Status: DC

## 2015-08-05 MED ORDER — CLOPIDOGREL BISULFATE 75 MG PO TABS: 75.0000 mg | ORAL_TABLET | Freq: Every day | ORAL | Status: DC

## 2015-08-05 MED ORDER — ATORVASTATIN CALCIUM 40 MG PO TABS: 40.0000 mg | ORAL_TABLET | Freq: Every day | ORAL | Status: DC

## 2015-08-05 MED FILL — Magnesium Sulfate Inj 50%: INTRAMUSCULAR | Qty: 10 | Status: AC

## 2015-08-05 MED FILL — Potassium Chloride Inj 2 mEq/ML: INTRAVENOUS | Qty: 40 | Status: AC

## 2015-08-05 MED FILL — Heparin Sodium (Porcine) Inj 1000 Unit/ML: INTRAMUSCULAR | Qty: 30 | Status: AC

## 2015-08-05 NOTE — Care Management Note (Signed)
Case Management Note  Patient Details  Name: Jeffrey [name] MRN: [id number] Date of Birth: 1941/06/01  Subjective/Objective:      Pt is s/p TAVR     Action/Plan:  Pt is independent at home with wife, grandaughter and son.  Per pt; he will have 24 hour supervision upon discharge.     Expected Discharge Date:                  Expected Discharge Plan:  Home/Self Care  In-House Referral:     Discharge planning Services  CM Consult  Post Acute Care Choice:    Choice offered to:     DME Arranged:    DME Agency:  [place] Arranged:    [place] Agency:  [facility]  Status of Service:  In process, will continue to follow  Medicare Important Message Given:  Yes-third notification given Date Medicare IM Given:    Medicare IM give by:    Date Additional Medicare IM Given:    Additional Medicare Important Message give by:     If discussed at [place] of Stay Meetings, dates discussed:    Additional Comments: CM assessed pt, offered pt choice, pt choice [facility] for both HHPT and rolling walker, agency contacted, referral accepted/contingent upon orders being written.  CM requested bedside nurse for orders during progression rounds.  CM requested order on physician sticky note on summary page. Maryclare Labrador, RN 08/05/2015, 2:24 PM

## 2015-08-05 NOTE — Progress Notes (Signed)
  Echocardiogram 2D Echocardiogram has been performed.  Jeffrey [name] 08/05/2015, 2:52 PM

## 2015-08-05 NOTE — Progress Notes (Signed)
    Subjective:  Patient denies CP or dyspnea; left groin pain improving  Objective:  Vital Signs in the last 24 hours: Temp:  [97.8 F (36.6 C)-98 F (36.7 C)] 97.8 F (36.6 C) (09/09 0512) Pulse Rate:  [67-77] 76 (09/09 0512) Resp:  [16-17] 16 (09/09 0512) BP: (108-119)/(51-64) 111/58 mmHg (09/09 0512) SpO2:  [92 %-100 %] 94 % (09/09 0512) Weight:  [96.4 kg (212 lb 8.4 oz)] 96.4 kg (212 lb 8.4 oz) (09/09 0512)  Intake/Output from previous day: 09/08 0701 - 09/09 0700 In: 120 [P.O.:120] Out: -   Physical Exam: Pt is alert and oriented, elderly male in NAD HEENT: normal Neck: supple Lungs: CTA CV: RRR 1/6 systolic murmur LSB; no diastolic murmur Abd: soft, NT, no masses Ext: trace edema; stable ecchymoses on right, left groin incision clear Skin: warm/dry no rash Neuro: grossly intact   Lab Results:  Recent Labs  08/03/15 0510 08/05/15 0355  WBC 7.3 8.1  HGB 8.7* 8.9*  PLT 149* 105*    Recent Labs  08/02/15 1631 08/03/15 0510  NA 131* 129*  K 4.4 4.5  CL 95* 91*  CO2  --  24  GLUCOSE 132* 126*  BUN 44* 53*  CREATININE 6.80* 7.24*    Tele: V-paced rhythm  CXR: IMPRESSION: 1. Interim removal of Swan-Ganz catheter. Right IJ sheath in stable position. 2. Cardiac pacer in stable position. Prior aortic valve replacement. Stable cardiomegaly and pulmonary venous congestion. Interim clearing of pulmonary interstitial edema. No pleural effusion. 3. Mild bibasilar subsegmental atelectasis.  Assessment/Plan:  1. Acute on chronic diastolic CHF, improved 2. Severe aortic stenosis s/p TAVR POD#2 3. ESRD 4. Type II DM 5. PAF - maintaining sinus rhythm, deemed poor candidate for anticoagulation in past 6. CAD without angina 7. Anemia - stable 8. Transaminitis - improving  Progressing very well. Continue ASA and plavix. Await 2D Echo today s/p TAVR and completion of dialysis. If echo stable, DC today with outpt fu with Dr Burt Knack. Will need LFTs rechecked  as outpt. > 30 min PA and physician time D2 Kirk Ruths, M.D. 08/05/2015, 9:30 AM

## 2015-08-05 NOTE — Care Management Important Message (Signed)
Important Message  Patient Details  Name: Jeffrey [name] MRN: [id number] Date of Birth: 1941-01-14   Medicare Important Message Given:  Yes-third notification given    Nathen May 08/05/2015, 11:33 AMImportant Message  Patient Details  Name: Jeffrey [name] MRN: [id number] Date of Birth: June 09, 1941   Medicare Important Message Given:  Yes-third notification given    Nathen May 08/05/2015, 11:33 AM

## 2015-08-05 NOTE — Discharge Summary (Signed)
After discharge, called by nurse for pain med rx as patient has been receiving pain medicine regularly. Discussed with Dr. Stanford Breed who agreed he is appropriate for Percocet rx. This was added to his discharge med list and a short term prescription was provided.   Also ordered HHPT per CM request.   Melina Copa PA-C

## 2015-08-05 NOTE — Discharge Summary (Signed)
Discharge Summary   Patient ID: Jeffrey [name],  MRN: [id number], DOB/AGE: 76-05-1941 76 y.o.  Admit date: 07/18/2015 Discharge date: 08/05/2015  Primary Care Provider: [facility] Primary Cardiologist: Dr. Caryl Comes   Discharge Diagnoses Principal Problem:   S/P TAVR (transcatheter aortic valve replacement) Active Problems:   DM (diabetes mellitus), type 2   HLD (hyperlipidemia)   CAD (coronary artery disease)   Chest pain   Fluid overload   Acute respiratory failure   ESRD on hemodialysis   Acute on chronic diastolic CHF (congestive heart failure)   Severe aortic stenosis   Hypervolemia   Aortic stenosis   Hyperkalemia   Allergies Allergies  Allergen Reactions  . Byetta 10 Mcg Pen [Exenatide] Diarrhea and Nausea And Vomiting    Procedures  Cath 07/22/15 Conclusion     Mid RCA lesion, 100% stenosed with distal vessel filling from left to right collaterals.  Prox Cx lesion, 20% stenosed.  Mid Cx lesion, 30% stenosed.  3rd Mrg lesion, 20% stenosed.  Mid LAD lesion, 50% stenosed.  Ost 2nd Diag lesion, 40% stenosed.  1. 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 This does not appear to be flow limiting. Chronic occlusion mid RCA with filling of the distal vessel from left to right collaterals.  2. Severe aortic stenosis by echo and cardiac cath in November. I did not cross the aortic valve today.   Recommendations: Continue planning for AVR vs TAVR. He will not need coronary intervention before the aortic valve procedure. He may be able to be discharged this weekend and have follow up in the valve clinic to finalize planning for the aortic valve procedure     TEE: 08/02/15 Study Conclusions  - Impressions: Pre TAVR: severely calcified trileaflet aortic valve mean gradient 43 mmHg by TTE. Mild AR. Mild MR EF 45% with septal apical and inferior wall hypokineis. Normal aortic root no pericardial effusion and  no LAA thormbus. PFO present  After aortic ballon valvotomy AR became servere  Post TAVR: EF 50% no new RWMA;s. Excellent position of 29 mm Sapien 3 valve. Trivial peri valvular regurgitation and mean gradietn less than 5 mmhg. MR remained Mild. No pericardial effusion and normal aortic root withno trauma. PFO persisted  Impressions:  - Pre TAVR: severely calcified trileaflet aortic valve mean gradient 43 mmHg by TTE. Mild AR. Mild MR EF 45% with septal apical and inferior wall hypokineis. Normal aortic root no pericardial effusion and no LAA thormbus. PFO present  After aortic ballon valvotomy AR became servere  Post TAVR: EF 50% no new RWMA;s. Excellent position of 29 mm Sapien 3 valve. Trivial peri valvular regurgitation and mean gradietn less than 5 mmhg. MR remained Mild. No pericardial effusion and normal aortic root withno trauma. PFO persisted   Transcatheter Aortic Valve Replacement - Transfemoral Approach: 08/02/15 Edwards Sapien 3 THV (size 29 mm, model # 9600TFX, serial # [id number])  Echo 08/05/15 LV EF: 45% -  50%  ------------------------------------------------------------------- History:  PMH: Status post TAVR. Coronary artery disease. Congestive heart failure. Risk factors: Chest pain. End stage renal disease. Diabetes mellitus. Dyslipidemia.  ------------------------------------------------------------------- Study Conclusions  - Left ventricle: The cavity size was normal. Wall thickness was normal. Systolic function was mildly reduced. The estimated ejection fraction was in the range of 45% to 50%. Wall motion was normal; there were no regional wall motion abnormalities. Features are consistent with a pseudonormal left ventricular filling pattern, with concomitant abnormal relaxation and increased filling pressure (grade  2 diastolic dysfunction). - Aortic valve: Valve area (VTI): 2.62 cm^2. Valve  area (Vmax): 2.97 cm^2. Valve area (Vmean): 2.41 cm^2. - Mitral valve: There was mild regurgitation. - Left atrium: The atrium was severely dilated. - Right ventricle: The cavity size was mildly dilated. Systolic function was mildly reduced. - Right atrium: The atrium was mildly dilated.  Impressions:  - The TAVR appears to be working normally   History of Present Illness  The patient is a 74 y/o male, followed by Dr. Caryl Comes, with a h/o CAD, Aortic stenosis, PAF, symptomatic bradycardia s/p PPM insertion, type 2 diabetes mellitus, a long-standing history of hypertension, hyperlipidemia, remote history of stroke and chronic kidney disease on HD (M,W,F) who who presented 07/18/15 to the ED with complaints of increased SOB and Cough over the past 24 hours.   In November 2015, he presented with chest pain and ruled in for NSTEMI. Subsequent LHC by Dr. Claiborne Billings 10/01/15 demonstrated significant coronary obstructive disease with coronary calcification and total occlusion of the proximal to mid RCA with extensive left-to-right collaterals; 50-60% stenosis in the proximal LAD after the first diagonal branch, and 30% mid AV groove circumflex stenoses. Given his collateral circulation, no intervention was performed. He was also noted to have a calcified AV with a 43 mm gradient and calculated valve area of 1.72 cm 2. His AS was felt to be moderate. Systolic function was noted to be well preserved at 65-70%.  Most recently, on 07/11/15, he underwent insertion of a Biotronik dual chamber PPM for recurrent symptomatic bradycardia. Per records, he did have some chest pain post procedure that was felt to be related to demand ischemia with restoration of AV synchrony and faster rates. However, his device was reprogrammed VVI 60 with resolution of pain. Prior to discharge, beside echo demonstrated no effusion, CXR was unremarkable for pneumothorax and device interrogation revealed normal functioning. He was  discharged home on 07/12/15.   He presented back to the [facility] ED on 07/18/15 with complaints of chest pain and dyspnea. Symptoms developed 2 days ago. He notes substernal chest pressure and tightness started after the development of a cough and increased work of breathing.    Hospital Course  He was admitted with acute respiratory failure due to volume overload. He underwent HD for volume management. He was symptomatic due to stage D severe aortic stenosis with acute on chronic diastolic heart failure. He was clinically improved after hemodialysis however had marked hyperkalemia after refusing dialysis, then aggred for HD. He also had significant transaminitis which was due to hypotension (shock liver) and seen by GI team and given vitamin K. He was seen by heart valve team and felt not a candidate for conventional aortic valve surgery because of his severe comorbid conditions. Plan made for TVAR. Cardiac cath showed chronic mid RCA occlusion with left to right collaterals to the distal vessel, 50% LAD disease. CAD stable since previous cath recommended medical management. Cardiac CT showed everely thickened and calcified aortic valve with severely restricted leaflet opening and annular measurements suitable for deployment of 26 mm Edward-SAPIEN 3 valve. Patient had successful TAVR procedure 08/02/15. He progressed will and interstitial edema cleared. He maintained sinus rhythm, deemed poor candidate for anticoagulation in past. Started on DAPT with ASA and Plavix. Patient denied chest pain or dyspnea. He felt stable for discharge. He had dialysis and echo prior to discharge. Post TAVR echo showed LV Ef of 45-50%, no WM abnormality, grade 2 DD, mild mitral regur, mild dilated RA, Aortic  valve: Valve area (VTI): 2.62 cm^2. Valve area (Vmax): 2.97 cm^2. Valve area (Vmean): 2.41 cm^2. F/u with Dr. Burt Knack in 1 month with same day repeat echo. He will also need LFT recheck at that time. Resume outpatient HD, MWF.    Discharge Vitals Blood pressure 125/50, pulse 75, temperature 97.5 F (36.4 C), temperature source Oral, resp. rate 18, height 5\' 11"  (1.803 m), weight 217 lb 6 oz (98.6 kg), SpO2 100 %.  Filed Weights   08/04/15 0543 08/05/15 0512 08/05/15 0830  Weight: 204 lb (92.534 kg) 212 lb 8.4 oz (96.4 kg) 217 lb 6 oz (98.6 kg)    Labs  CBC  Recent Labs  08/05/15 0355 08/05/15 0945  WBC 8.1 8.3  NEUTROABS  --  6.1  HGB 8.9* 8.5*  HCT 28.1* 27.3*  MCV 101.4* 100.4*  PLT 105* [id number]*   Basic Metabolic Panel  Recent Labs  08/03/15 0510 08/05/15 0945  NA 129* 129*  K 4.5 4.2  CL 91* 92*  CO2 24 23  GLUCOSE 126* 105*  BUN 53* 40*  CREATININE 7.24* 6.11*  CALCIUM 8.3* 7.7*  MG 2.0  --   PHOS  --  6.5*   Liver Function Tests  Recent Labs  08/05/15 0945  ALBUMIN 2.4*    Disposition  Pt is being discharged home today in good condition.  Follow-up Plans & Appointments  Follow-up Information    Follow up with [name], MD On 09/05/2015.   Specialty:  Cardiology   Why:  @ 10:30 for echo and @ 11:30 for cardiology follow up   Contact information:   [address] [place] [address] [phone]           Discharge Instructions    Call MD for:  redness, tenderness, or signs of infection (pain, swelling, redness, odor or green/yellow discharge around incision site)    Complete by:  As directed      Diet - low sodium heart healthy    Complete by:  As directed      Discharge instructions    Complete by:  As directed   No driving until seen in clinic by cardiologist.     Increase activity slowly    Complete by:  As directed            F/u Labs/Studies: LFT recheck   Discharge Medications    Medication List    STOP taking these medications        amLODipine 10 MG tablet  Commonly known as:  NORVASC     hydrALAZINE 25 MG tablet  Commonly known as:  APRESOLINE     simvastatin 20 MG tablet  Commonly known as:  ZOCOR       TAKE these medications        aspirin 81 MG chewable tablet  Chew 81 mg by mouth daily.     atorvastatin 40 MG tablet  Commonly known as:  LIPITOR  Take 1 tablet (40 mg total) by mouth daily at 6 PM.     calcium carbonate 500 MG chewable tablet  Commonly known as:  TUMS - dosed in mg elemental calcium  Chew 4 tablets by mouth 3 (three) times daily before meals.     clopidogrel 75 MG tablet  Commonly known as:  PLAVIX  Take 1 tablet (75 mg total) by mouth daily with breakfast.     LEVEMIR FLEXTOUCH 100 UNIT/ML Pen  Generic drug:  Insulin Detemir  Inject 35 Units into the  skin at bedtime.     lidocaine-prilocaine cream  Commonly known as:  EMLA  Apply 1 application topically 3 (three) times a week. Apply 1 application onto the left arm as directed     metoprolol tartrate 25 MG tablet  Commonly known as:  LOPRESSOR  Take 0.5 tablets (12.5 mg total) by mouth 2 (two) times daily.     multivitamin Tabs tablet  Take 1 tablet by mouth daily.     NITROSTAT 0.4 MG SL tablet  Generic drug:  nitroGLYCERIN  Place 0.4 mg under the tongue every 5 (five) minutes as needed for chest pain.     pantoprazole 40 MG tablet  Commonly known as:  PROTONIX  Take 1 tablet (40 mg total) by mouth daily.        Duration of Discharge Encounter   Greater than 30 minutes including physician time.  Signed, Sharina Petre PA-C 08/05/2015, 5:28 PM

## 2015-08-05 NOTE — Progress Notes (Signed)
[facility] Progress Note   Subjective: on HD  Filed Vitals:   08/05/15 1000 08/05/15 1035 08/05/15 1100 08/05/15 1130  BP: 132/47 131/48 137/49 136/52  Pulse: 78 76 79 73  Temp:      TempSrc:      Resp:      Height:      Weight:      SpO2:       Exam: Alert, no distress No jvd Chest clear bilat RRR no MRG Abd soft ntnd no ascites No LE edema LUA AVF +bruit Neuro is alert, Ox 3  MWF NW  4hr   2/2 bath  97.5kg   LUA AVF  Heparin 10K      Assessment: 1 Severe AS, s/p TAVR 2 ESRD HD friday 3 Vol below dry wt 4 Anemia 5 HPTH 6 SP pacemaker 7 DM 8 Dispo poss dc today , per cards  Plan - HD today    Kelly Splinter MD  pager [phone]    cell [phone]  08/05/2015, 12:11 PM     Recent Labs Lab 07/29/15 1740  08/01/15 0809  08/02/15 1631 08/03/15 0510 08/05/15 0945  NA 133*  < > 130*  < > 131* 129* 129*  K 5.4*  < > 4.9  < > 4.4 4.5 4.2  CL 93*  < > 90*  < > 95* 91* 92*  CO2 19*  < > 21*  --   --  24 23  GLUCOSE 168*  < > 138*  < > 132* 126* 105*  BUN 89*  < > 81*  < > 44* 53* 40*  CREATININE 10.47*  < > 9.51*  < > 6.80* 7.24* 6.11*  CALCIUM 8.9  < > 8.3*  --   --  8.3* 7.7*  PHOS 9.8*  --  8.5*  --   --   --  6.5*  < > = values in this interval not displayed.  Recent Labs Lab 07/30/15 0307 08/01/15 0344 08/01/15 0809 08/05/15 0945  AST 358* 90*  --   --   ALT 1199* 606*  --   --   ALKPHOS 134* 116  --   --   BILITOT 1.5* 1.3*  --   --   PROT 6.0* 6.3*  --   --   ALBUMIN 2.9* 2.9* 2.9* 2.4*    Recent Labs Lab 08/03/15 0510 08/05/15 0355 08/05/15 0945  WBC 7.3 8.1 8.3  NEUTROABS  --   --  6.1  HGB 8.7* 8.9* 8.5*  HCT 28.0* 28.1* 27.3*  MCV 100.4* 101.4* 100.4*  PLT 149* 105* 112*   . antiseptic oral rinse  7 mL Mouth Rinse BID  . aspirin  81 mg Oral Daily  . atorvastatin  40 mg Oral q1800  . calcium carbonate  3 tablet Oral TID WC  . clopidogrel  75 mg Oral Q breakfast  . darbepoetin (ARANESP) injection - DIALYSIS   100 mcg Intravenous Q Mon-HD  . ferric gluconate (FERRLECIT/NULECIT) IV  62.5 mg Intravenous Q Fri-HD  . heparin  5,000 Units Subcutaneous 3 times per day  . insulin aspart  0-9 Units Subcutaneous TID WC  . insulin detemir  10 Units Subcutaneous Daily  . metoprolol tartrate  12.5 mg Oral BID  . multivitamin  1 tablet Oral QHS  . pantoprazole  40 mg Oral Q0600  . sodium chloride  3 mL Intravenous Q12H     sodium chloride, acetaminophen **OR** [DISCONTINUED] acetaminophen, albuterol, HYDROcodone-homatropine, metoprolol, nitroGLYCERIN, [DISCONTINUED] ondansetron **  OR** ondansetron (ZOFRAN) IV, oxyCODONE, sodium chloride, traMADol

## 2015-08-05 NOTE — Progress Notes (Signed)
Nutrition Follow-up  DOCUMENTATION CODES:   Not applicable  INTERVENTION:    Continue Nepro Shake po BID, each supplement provides 425 kcal and 19 grams protein  NUTRITION DIAGNOSIS:   Inadequate oral intake related to nausea as evidenced by meal completion < 50%, improved  GOAL:   Patient will meet greater than or equal to 90% of their needs, progressing  MONITOR:   PO intake, Supplement acceptance, Labs, Weight trends, Skin, I & O's  ASSESSMENT:   74 y.o. male with a history of ESRD on HD, P. Afib, CAD, HTN and DM2 and recent Pacemaker placement who presents to the ED with complaints of increased SOB and Cough over the past 24 hours.  Pt s/p procedure 9/6: TRANSCATHETER AORTIC VALVE REPLACEMENT  Pt continues on a Carbohydrate Modified diet.  PO intake remains variable at 0-100% per flowsheet records.  Was receiving Nepro Shake supplements prior to procedure.  Will re-order.  Diet Order:  Diet Carb Modified Fluid consistency:: Thin; Room service appropriate?: Yes  Skin:  Wound (see comment) (closed incision on right chest)  Last BM:  9/7  Height:   Ht Readings from Last 1 Encounters:  07/18/15 5\' 11"  (1.803 m)    Weight:   Wt Readings from Last 1 Encounters:  08/05/15 217 lb 6 oz (98.6 kg)    Ideal Body Weight:  75.5 kg  BMI:  Body mass index is 30.33 kg/(m^2).  Estimated Nutritional Needs:   Kcal:  2000-2300  Protein:  90-100 grams  Fluid:  per MD  EDUCATION NEEDS:   No education needs identified at this time  Arthur Holms, RD, LDN Pager #: [phone] After-Hours Pager #: [phone]

## 2015-08-05 NOTE — Progress Notes (Signed)
Physical Therapy Treatment Patient Details Name: Jeffrey [name] MRN: [id number] DOB: 02-28-1941 Today's Date: 08/05/2015    History of Present Illness Jeffrey [name] is a 74 y.o. male with a history of ESRD on HD, P. Afib, CAD, HTN and DM2 and recent Pacemaker placement who presents to the ED with complaints of increased SOB and Cough over the past 24 hours. He reports chest pain associated with coughing. With sever AS now planned TAV R., now s/p TAVR on 08/02/15.    PT Comments    Patient fatigued from dialysis session earlier today and limited walking distance as patient anticipates d/c home this pm.  Has two steps to enter but will have assist from family and has bilateral railings.  Feel walker for home will assist with access to/from dialysis.  Patient seems overall more energetic than prior to procedure despite having had dialysis this afternoon.  Recommend HHPT initially, but would like to see him referred to outpatient cardiac rehab soon after d/c.  Follow Up Recommendations  Home health PT     Equipment Recommendations  Rolling walker with 5" wheels    Recommendations for Other Services       Precautions / Restrictions Precautions Precautions: Fall    Mobility  Bed Mobility               General bed mobility comments: Pt up in recliner  Transfers   Equipment used: Rolling walker (2 wheeled) Transfers: Sit to/from Stand Sit to Stand: Supervision         General transfer comment: increased time due to pain in groin  Ambulation/Gait Ambulation/Gait assistance: Supervision Ambulation Distance (Feet): 60 Feet Assistive device: Rolling walker (2 wheeled) Gait Pattern/deviations: Step-through pattern;Trunk flexed     General Gait Details: mild flexion with ambulation, walked quickly back to chair; limited due to fatigue/general weakness after dialysis   Stairs            Wheelchair Mobility    Modified Rankin (Stroke Patients Only)        Balance           Standing balance support: No upper extremity supported Standing balance-Leahy Scale: Fair Standing balance comment: can stand static without UE support                    Cognition Arousal/Alertness: Awake/alert Behavior During Therapy: WFL for tasks assessed/performed Overall Cognitive Status: Within Functional Limits for tasks assessed                      Exercises      General Comments        Pertinent Vitals/Pain Pain Assessment: Faces Faces Pain Scale: Hurts whole lot Pain Location: left groin surgical wound Pain Descriptors / Indicators: Sore Pain Intervention(s): Monitored during session    Home Living                      Prior Function            PT Goals (current goals can now be found in the care plan section) Progress towards PT goals: Goals met/education completed, patient discharged from PT    Frequency  Min 3X/week    PT Plan Current plan remains appropriate    Co-evaluation             End of Session Equipment Utilized During Treatment: Gait belt Activity Tolerance: Patient tolerated treatment well Patient left: in chair;with call bell/phone within reach  Time: 7542-3702 PT Time Calculation (min) (ACUTE ONLY): 28 min  Charges:  $Gait Training: 8-22 mins $Self Care/Home Management: 8-22                    G Codes:      WYNN,CYNDI Aug 28, 2015, 3:43 PM  Magda Kiel, [place] 08/28/15

## 2015-08-07 ENCOUNTER — Emergency Department (HOSPITAL_COMMUNITY): Payer: Medicare Other

## 2015-08-07 ENCOUNTER — Emergency Department (HOSPITAL_BASED_OUTPATIENT_CLINIC_OR_DEPARTMENT_OTHER)
Admit: 2015-08-07 | Discharge: 2015-08-07 | Disposition: A | Discharge status: Home / Self Care | Payer: Medicare Other | Attending: Emergency Medicine | Admitting: Emergency Medicine

## 2015-08-07 ENCOUNTER — Emergency Department (HOSPITAL_COMMUNITY)
Admission: EM | Admit: 2015-08-07 | Discharge: 2015-08-07 | Disposition: A | Discharge status: Home / Self Care | Payer: Medicare Other | Attending: Emergency Medicine | Admitting: Emergency Medicine

## 2015-08-07 ENCOUNTER — Encounter (HOSPITAL_COMMUNITY): Payer: Self-pay

## 2015-08-07 ENCOUNTER — Telehealth: Payer: Self-pay | Admitting: Physician Assistant

## 2015-08-07 DIAGNOSIS — R7989 Other specified abnormal findings of blood chemistry: Secondary | ICD-10-CM

## 2015-08-07 DIAGNOSIS — M79609 Pain in unspecified limb: Secondary | ICD-10-CM

## 2015-08-07 DIAGNOSIS — R2242 Localized swelling, mass and lump, left lower limb: Secondary | ICD-10-CM | POA: Diagnosis present

## 2015-08-07 DIAGNOSIS — R05 Cough: Secondary | ICD-10-CM | POA: Diagnosis not present

## 2015-08-07 DIAGNOSIS — Z79899 Other long term (current) drug therapy: Secondary | ICD-10-CM | POA: Insufficient documentation

## 2015-08-07 DIAGNOSIS — Z862 Personal history of diseases of the blood and blood-forming organs and certain disorders involving the immune mechanism: Secondary | ICD-10-CM | POA: Insufficient documentation

## 2015-08-07 DIAGNOSIS — I120 Hypertensive chronic kidney disease with stage 5 chronic kidney disease or end stage renal disease: Secondary | ICD-10-CM | POA: Insufficient documentation

## 2015-08-07 DIAGNOSIS — Z87891 Personal history of nicotine dependence: Secondary | ICD-10-CM | POA: Insufficient documentation

## 2015-08-07 DIAGNOSIS — Z8701 Personal history of pneumonia (recurrent): Secondary | ICD-10-CM | POA: Insufficient documentation

## 2015-08-07 DIAGNOSIS — I25119 Atherosclerotic heart disease of native coronary artery with unspecified angina pectoris: Secondary | ICD-10-CM | POA: Insufficient documentation

## 2015-08-07 DIAGNOSIS — Z992 Dependence on renal dialysis: Secondary | ICD-10-CM | POA: Insufficient documentation

## 2015-08-07 DIAGNOSIS — Z7982 Long term (current) use of aspirin: Secondary | ICD-10-CM | POA: Insufficient documentation

## 2015-08-07 DIAGNOSIS — M7981 Nontraumatic hematoma of soft tissue: Secondary | ICD-10-CM | POA: Insufficient documentation

## 2015-08-07 DIAGNOSIS — Z9889 Other specified postprocedural states: Secondary | ICD-10-CM | POA: Insufficient documentation

## 2015-08-07 DIAGNOSIS — Z952 Presence of prosthetic heart valve: Secondary | ICD-10-CM | POA: Diagnosis not present

## 2015-08-07 DIAGNOSIS — E119 Type 2 diabetes mellitus without complications: Secondary | ICD-10-CM | POA: Insufficient documentation

## 2015-08-07 DIAGNOSIS — I509 Heart failure, unspecified: Secondary | ICD-10-CM | POA: Diagnosis not present

## 2015-08-07 DIAGNOSIS — M7989 Other specified soft tissue disorders: Secondary | ICD-10-CM

## 2015-08-07 DIAGNOSIS — Z8673 Personal history of transient ischemic attack (TIA), and cerebral infarction without residual deficits: Secondary | ICD-10-CM | POA: Insufficient documentation

## 2015-08-07 DIAGNOSIS — S7012XA Contusion of left thigh, initial encounter: Secondary | ICD-10-CM

## 2015-08-07 DIAGNOSIS — I5043 Acute on chronic combined systolic (congestive) and diastolic (congestive) heart failure: Secondary | ICD-10-CM | POA: Diagnosis not present

## 2015-08-07 DIAGNOSIS — I252 Old myocardial infarction: Secondary | ICD-10-CM | POA: Insufficient documentation

## 2015-08-07 DIAGNOSIS — Z794 Long term (current) use of insulin: Secondary | ICD-10-CM | POA: Insufficient documentation

## 2015-08-07 DIAGNOSIS — I4891 Unspecified atrial fibrillation: Secondary | ICD-10-CM | POA: Diagnosis not present

## 2015-08-07 DIAGNOSIS — R059 Cough, unspecified: Secondary | ICD-10-CM

## 2015-08-07 DIAGNOSIS — E785 Hyperlipidemia, unspecified: Secondary | ICD-10-CM | POA: Diagnosis not present

## 2015-08-07 DIAGNOSIS — R600 Localized edema: Secondary | ICD-10-CM

## 2015-08-07 DIAGNOSIS — N186 End stage renal disease: Secondary | ICD-10-CM | POA: Diagnosis not present

## 2015-08-07 DIAGNOSIS — Z7902 Long term (current) use of antithrombotics/antiplatelets: Secondary | ICD-10-CM | POA: Diagnosis not present

## 2015-08-07 LAB — CBC WITH DIFFERENTIAL/PLATELET
BASOS ABS: 0 10*3/uL (ref 0.0–0.1)
Basophils Relative: 0 % (ref 0–1)
EOS ABS: 0.1 10*3/uL (ref 0.0–0.7)
EOS PCT: 2 % (ref 0–5)
HCT: 26.5 % — ABNORMAL LOW (ref 39.0–52.0)
HEMOGLOBIN: 8.4 g/dL — AB (ref 13.0–17.0)
Lymphocytes Relative: 9 % — ABNORMAL LOW (ref 12–46)
Lymphs Abs: 0.6 10*3/uL — ABNORMAL LOW (ref 0.7–4.0)
MCH: 31.6 pg (ref 26.0–34.0)
MCHC: 31.7 g/dL (ref 30.0–36.0)
MCV: 99.6 fL (ref 78.0–100.0)
Monocytes Absolute: 1.2 10*3/uL — ABNORMAL HIGH (ref 0.1–1.0)
Monocytes Relative: 17 % — ABNORMAL HIGH (ref 3–12)
NEUTROS PCT: 72 % (ref 43–77)
Neutro Abs: 4.9 10*3/uL (ref 1.7–7.7)
PLATELETS: 117 10*3/uL — AB (ref 150–400)
RBC: 2.66 MIL/uL — AB (ref 4.22–5.81)
RDW: 20.5 % — ABNORMAL HIGH (ref 11.5–15.5)
WBC: 6.7 10*3/uL (ref 4.0–10.5)

## 2015-08-07 LAB — BASIC METABOLIC PANEL
Anion gap: 14 (ref 5–15)
BUN: 38 mg/dL — AB (ref 6–20)
CALCIUM: 7.4 mg/dL — AB (ref 8.9–10.3)
CO2: 24 mmol/L (ref 22–32)
CREATININE: 5.81 mg/dL — AB (ref 0.61–1.24)
Chloride: 90 mmol/L — ABNORMAL LOW (ref 101–111)
GFR calc Af Amer: 10 mL/min — ABNORMAL LOW (ref >60)
GFR, EST NON AFRICAN AMERICAN: 8 mL/min — AB (ref >60)
GLUCOSE: 86 mg/dL (ref 65–99)
Potassium: 4.2 mmol/L (ref 3.5–5.1)
Sodium: 128 mmol/L — ABNORMAL LOW (ref 135–145)

## 2015-08-07 LAB — I-STAT TROPONIN, ED: TROPONIN I, POC: 0.33 ng/mL — AB (ref 0.00–0.08)

## 2015-08-07 LAB — I-STAT CG4 LACTIC ACID, ED: LACTIC ACID, VENOUS: 1.3 mmol/L (ref 0.5–2.0)

## 2015-08-07 LAB — BRAIN NATRIURETIC PEPTIDE: B NATRIURETIC PEPTIDE 5: 3748.2 pg/mL — AB (ref 0.0–100.0)

## 2015-08-07 NOTE — ED Provider Notes (Signed)
74 yo male w/ recent transcath Aortic valve replacement via left groin. P/w LLE swelling.  Surgical site normal post op. Trop slightly elevated and cards aware and unconcerned. Plan is to [place] LLE, if normal can be dc'd w/ f/u with HD in AM.    Patient's ultrasound showed a thigh hematoma with no pseudoaneurysm. Patient advised to use compression and is to follow up with PCP. Return precautions given.  Renne Musca, MD 08/07/15 [phone]

## 2015-08-07 NOTE — Discharge Instructions (Signed)
Hematoma A hematoma is a collection of blood. The collection of blood can turn into a hard, painful lump under the skin. Your skin may turn blue or yellow if the hematoma is close to the surface of the skin. Most hematomas get better in a few days to weeks. Some hematomas are serious and need medical care. Hematomas can be very small or very big. HOME CARE  Apply ice to the injured area:  Put ice in a plastic bag.  Place a towel between your skin and the bag.  Leave the ice on for 20 minutes, 2-3 times a day for the first 1 to 2 days.  After the first 2 days, switch to using warm packs on the injured area.  Raise (elevate) the injured area to lessen pain and puffiness (swelling). You may also wrap the area with an elastic bandage. Make sure the bandage is not wrapped too tight.  If you have a painful hematoma on your leg or foot, you may use crutches for a couple days.  Only take medicines as told by your doctor. GET HELP RIGHT AWAY IF:   Your pain gets worse.  Your pain is not controlled with medicine.  You have a fever.  Your puffiness gets worse.  Your skin turns more blue or yellow.  Your skin over the hematoma breaks or starts bleeding.  Your hematoma is in your chest or belly (abdomen) and you are short of breath, feel weak, or have a change in consciousness.  Your hematoma is on your scalp and you have a headache that gets worse or a change in alertness or consciousness. MAKE SURE YOU:   Understand these instructions.  Will watch your condition.  Will get help right away if you are not doing well or get worse. Document Released: 12/20/2004 Document Revised: 07/15/2013 Document Reviewed: 04/22/2013 [facility] Patient Information 2015 [place], [place]. This information is not intended to replace advice given to you by your health care provider. Make sure you discuss any questions you have with your health care provider.

## 2015-08-07 NOTE — ED Provider Notes (Signed)
CSN: [id number]     Arrival date & time 08/07/15  1158 History   First MD Initiated Contact with Patient 08/07/15 1315     Chief Complaint  Patient presents with  . Leg Swelling     (Consider location/radiation/quality/duration/timing/severity/associated sxs/prior Treatment) HPI  Jeffrey [name] 74 y.o.male  PCP: [name],[name], MD  Blood pressure 102/57, pulse 64, temperature 97.8 F (36.6 C), temperature source Oral, resp. rate 16, height 5\' 11"  (1.803 m), weight 217 lb 9.6 oz (98.703 kg), SpO2 100 %.  SIGNIFICANT PMH: hypertension, a-fib, aortic stenosis with recent aortic valve repair, CAD, PVD, hx of MI, pneumonia, Diabetes, ESRD on dilaysis MWF- LUE fistula  CHIEF COMPLAINT: lower extremity swelling and cough  When: patient had an AVR transcatheter procedure done on 08/04/15 and since discharge has developed LE swelling that has been worsening How: today his swelling to the legs is so severe that he has had difficulty walking and significant coughing Chronicity: acute Location: left leg Radiation: N/A Quality and severity: achy and cramping pain Alleviating factors: rest Worsening factors: walking Treatments tried: None Associated Symptoms: coughing and right leg swelling. Negative ROS: Confusion, diaphoresis, fever, headache, weakness (general or focal), change of vision,  neck pain, dysphagia, aphagia, chest pain, shortness of breath,  back pain, abdominal pains, nausea, vomiting, diarrhea,  rash.   Past Medical History  Diagnosis Date  . Hyperlipidemia   . Hypertension   . Aortic stenosis     a. severe by echo 09/2014  . Atrial fibrillation     a. not well documented, not on anticoagulation  . Coronary artery disease     a. chronically occluded RCA per cath 09/2014 with collaterals  . Peripheral vascular disease   . Claustrophobia   . Complete heart block   . Presence of permanent cardiac pacemaker   . Anginal pain   . Myocardial infarction 10/2014  .  Pneumonia   . Type II diabetes mellitus   . Iron deficiency anemia   . History of blood transfusion     "related to gallbladder OR"  . History of stomach ulcers   . CVA (cerebral vascular accident) 10/2014    denies residual on 07/11/2015  . ESRD (end stage renal disease) on dialysis     a. on dialysis; [place]; MWF, LUE fistula (07/11/2015)  . CHF (congestive heart failure)   . Renal insufficiency   . 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 Esophagogastroduodenoscopy  08/01/2012    Procedure: ESOPHAGOGASTRODUODENOSCOPY (EGD);  Surgeon: [name], MD;  Location: [facility];  Service: Endoscopy;  Laterality: N/A;  . Tonsillectomy    . Colonoscopy w/ biopsies and polypectomy    . Av fistula placement Left 10/19/2014    Procedure: BRACHIOCEPHALIC ARTERIOVENOUS (AV) FISTULA CREATION ;  Surgeon: Conrad Reynolds, MD;  Location: [facility];  Service: Vascular;  Laterality: Left;  . Left and right heart catheterization with coronary angiogram N/A 09/30/2014    Procedure: LEFT AND RIGHT HEART CATHETERIZATION WITH CORONARY ANGIOGRAM;  Surgeon: [name], MD;  Location: [facility] C[facility] LAB;  Service: Cardiovascular;  Laterality: N/A;  . Insertion of dialysis catheter Right 02/02/2015    Procedure: INSERTION OF DIALYSIS CATHETER  RIGHT INTERNAL JUGULAR;  Surgeon: Mal [name], MD;  Location: [facility];  Service: Vascular;  Laterality: Right;  . Cardiac  catheterization    . Insert / replace / remove pacemaker  07/11/2015  . Cholecystectomy open  1980's  . Cataract extraction w/ intraocular lens  implant, bilateral Bilateral 1990's  . Coronary angioplasty    . Ep implantable device N/A 07/11/2015    Procedure: Pacemaker Implant;  Surgeon: [name], MD;  Location: [facility] CV LAB;  Service: Cardiovascular;  Laterality: N/A;  .  Cardiac catheterization N/A 07/22/2015    Procedure: Right/Left Heart Cath and Coronary Angiography;  Surgeon: [name], MD;  Location: [place] CV LAB;  Service: Cardiovascular;  Laterality: N/A;  . Transcatheter aortic valve replacement, transfemoral Left 08/02/2015    Procedure: TRANSCATHETER AORTIC VALVE REPLACEMENT, TRANSFEMORAL;  Surgeon: [name], MD;  Location: [place];  Service: Open Heart Surgery;  Laterality: Left;  . Tee without cardioversion N/A 08/02/2015    Procedure: TRANSESOPHAGEAL ECHOCARDIOGRAM (TEE);  Surgeon: [name], MD;  Location: [facility];  Service: Open Heart Surgery;  Laterality: N/A;   Family History  Problem Relation Age of Onset  . Diabetes Father   . Heart disease Father   . Diabetes Sister    Social History  Substance Use Topics  . Smoking status: Former Smoker -- 2.00 packs/day for 32 years    Quit date: 11/27/1983  . Smokeless tobacco: Never Used  . Alcohol Use: Yes     Comment: 07/11/2015 "stopped drinking in 09/2014"    Review of Systems    Allergies  [place] 10 mcg pen  Home Medications   Prior to Admission medications   Medication Sig Start Date End Date Taking? Authorizing Provider  aspirin 81 MG chewable tablet Chew 81 mg by mouth daily.   Yes Historical Provider, MD  atorvastatin (LIPITOR) 40 MG tablet Take 1 tablet (40 mg total) by mouth daily at 6 PM. 08/05/15  Yes Bhavinkumar Bhagat, PA  calcium carbonate (TUMS - DOSED IN MG ELEMENTAL CALCIUM) 500 MG chewable tablet Chew 4 tablets by mouth 3 (three) times daily before meals.    Yes Historical Provider, MD  clopidogrel (PLAVIX) 75 MG tablet Take 1 tablet (75 mg total) by mouth daily with breakfast. 08/05/15  Yes Bhavinkumar Bhagat, PA  Insulin Detemir (LEVEMIR FLEXTOUCH) 100 UNIT/ML Pen Inject 35 Units into the skin at bedtime. 05/03/15  Yes Historical Provider, MD  lidocaine-prilocaine (EMLA) cream Apply 1 application topically 3 (three) times a week. Apply 1 application  onto the left arm as directed 06/21/15  Yes Historical Provider, MD  metoprolol tartrate (LOPRESSOR) 25 MG tablet Take 0.5 tablets (12.5 mg total) by mouth 2 (two) times daily. 08/05/15  Yes Bhavinkumar Bhagat, PA  multivitamin (RENA-VIT) TABS tablet Take 1 tablet by mouth daily.   Yes Historical Provider, MD  oxyCODONE-acetaminophen (PERCOCET/ROXICET) 5-325 MG per tablet Take 1 tablet by mouth every 6 (six) hours as needed for moderate pain or severe pain. 08/05/15  Yes Dayna N Dunn, PA-C  pantoprazole (PROTONIX) 40 MG tablet Take 1 tablet (40 mg total) by mouth daily. 08/05/15  Yes Bhavinkumar Bhagat, PA  nitroGLYCERIN (NITROSTAT) 0.4 MG SL tablet Place 0.4 mg under the tongue every 5 (five) minutes as needed for chest pain.  10/16/13   Historical Provider, MD   BP 103/43 mmHg  Pulse 64  Temp(Src) 97.8 F (36.6 C) (Oral)  Resp 15  Ht 5\' 11"  (1.803 m)  Wt 217 lb 9.6 oz (98.703 kg)  BMI 30.36 kg/m2  SpO2 99% Physical Exam  Constitutional: He appears well-developed and well-nourished. No distress.  HENT:  Head: Normocephalic and atraumatic.  Eyes: Pupils are equal, round, and reactive to light.  Neck: Normal range of motion. Neck supple.  Cardiovascular: Normal rate and regular rhythm.   Pulmonary/Chest: Effort normal. He has no decreased breath sounds. He has no wheezes.  Cough during exam  Abdominal: Soft.  Musculoskeletal:  Significant pitting edema to lower extremities.  Left leg is worse than right. Transcatheter site is to left inguinal region. Tender to touch. Associated ecchymosis to abdominal wall.  Neurological: He is alert.  Skin: Skin is warm and dry.  Nursing note and vitals reviewed.   ED Course  Procedures (including critical care time) Labs Review Labs Reviewed  BASIC METABOLIC PANEL - Abnormal; Notable for the following:    Sodium 128 (*)    Chloride 90 (*)    BUN 38 (*)    Creatinine, Ser 5.81 (*)    Calcium 7.4 (*)    GFR calc non Af Amer 8 (*)    GFR calc Af  Amer 10 (*)    All other components within normal limits  BRAIN NATRIURETIC PEPTIDE - Abnormal; Notable for the following:    B Natriuretic Peptide 3748.2 (*)    All other components within normal limits  CBC WITH DIFFERENTIAL/PLATELET - Abnormal; Notable for the following:    RBC 2.66 (*)    Hemoglobin 8.4 (*)    HCT 26.5 (*)    RDW 20.5 (*)    Platelets 117 (*)    Lymphocytes Relative 9 (*)    Lymphs Abs 0.6 (*)    Monocytes Relative 17 (*)    Monocytes Absolute 1.2 (*)    All other components within normal limits  I-STAT TROPOININ, ED - Abnormal; Notable for the following:    Troponin i, poc 0.33 (*)    All other components within normal limits  I-STAT CG4 LACTIC ACID, ED    Imaging Review Dg Chest 2 View  08/07/2015   CLINICAL DATA:  Acute left foot swelling.  EXAM: CHEST  2 VIEW  COMPARISON:  August 03, 2015.  FINDINGS: Stable cardiomegaly is noted. Stable mild central pulmonary vascular congestion is noted. Right-sided pacemaker is unchanged in position. No pneumothorax is noted. Status post aortic valve replacement. Mild bilateral pleural effusions are noted. Mild bibasilar subsegmental atelectasis is noted which is not significantly changed.  IMPRESSION: Stable mild bibasilar subsegmental atelectasis and mild pleural effusions. Stable cardiomegaly and mild central pulmonary vascular congestion.   Electronically Signed   By: Marijo Conception, M.D.   On: 08/07/2015 14:24   I have personally reviewed and evaluated these images and lab results as part of my medical decision-making.   EKG Interpretation   Date/Time:  Sunday August 07 2015 12:57:19 EDT Ventricular Rate:  65 PR Interval:  308 QRS Duration: 242 QT Interval:  512 QTC Calculation: 532 R Axis:   -97 Text Interpretation:  Atrial-sensed ventricular-paced rhythm with  prolonged AV conduction Abnormal ECG Confirmed by KNOTT MD, DANIEL [id number])  on 08/07/2015 2:38:15 PM      MDM   Final diagnoses:  Swelling  of lower extremity  Cough  S/P TAVR (transcatheter aortic valve replacement)   Dr. Laneta Simmers has seen patient as well. He spoke with Cardiology who is going to see the patient as well.   Filed Vitals:   08/07/15 1530  BP: 103/43  Pulse: 64  Temp:   Resp: 15   Cardiology has seen patient. He recommends doppler study to the lower extremity. If no blood clots  the patient can be discharged and will need to have routine dialysis tomorrow to full fluid off his leg. If positive will need inpatient admission.  At the end of shift patient hand off to ER resident MD, Renne Musca.   Delos Haring, PA-C 08/07/15 [place], PA-C 08/07/15 1553  Leo Grosser, MD 08/07/15 2113

## 2015-08-07 NOTE — ED Notes (Signed)
Patient transported to X-ray 

## 2015-08-07 NOTE — Progress Notes (Signed)
VASCULAR LAB PRELIMINARY  PRELIMINARY  PRELIMINARY  PRELIMINARY  Left lower extremity venous duplex completed.    Preliminary report:  There is no DVT or SVT noted in the left lower extremity.  There is a large hematoma noted in the groin with no sign of pseudoaneurysm noted.  Camil Hausmann, RVT 08/07/2015, 5:19 PM

## 2015-08-07 NOTE — Consult Note (Signed)
CARDIOLOGY CONSULT NOTE     Primary Care Physician: Thurman Coyer, MD Referring Physician:  Admit Date: 08/07/2015  Reason for consultation:  L leg swelling  Jeffrey [name] is a 74 y.o. male with a h/o multiple comoribidities including ESRD on HD, CAD, severe AS s/p recent TAVR, and atrial fibrillation who presents for evaluation of L leg swelling.  He is s/p recent TAVR with open L femoral approach.  He has had gradual worsening of L leg swelling.  He has stable SOB.  He reports last dialysis on Friday but is not sure how much fluid was removed.   Today, he denies symptoms of palpitations, chest pain, dizziness, presyncope, syncope, or neurologic sequela. The patient is tolerating medications without difficulties and is otherwise without complaint today.   Past Medical History  Diagnosis Date  . Hyperlipidemia   . Hypertension   . Aortic stenosis     a. severe by echo 09/2014  . Atrial fibrillation     a. not well documented, not on anticoagulation  . Coronary artery disease     a. chronically occluded RCA per cath 09/2014 with collaterals  . Peripheral vascular disease   . Claustrophobia   . Complete heart block   . Presence of permanent cardiac pacemaker   . Anginal pain   . Myocardial infarction 10/2014  . Pneumonia   . Type II diabetes mellitus   . Iron deficiency anemia   . History of blood transfusion     "related to gallbladder OR"  . History of stomach ulcers   . CVA (cerebral vascular accident) 10/2014    denies residual on 07/11/2015  . ESRD (end stage renal disease) on dialysis     a. on dialysis; [facility]; MWF, LUE fistula (07/11/2015)  . CHF (congestive heart failure)   . Renal insufficiency   . 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 Esophagogastroduodenoscopy  08/01/2012   Procedure: ESOPHAGOGASTRODUODENOSCOPY (EGD);  Surgeon: [name], MD;  Location: [facility];  Service: Endoscopy;  Laterality: N/A;  . Tonsillectomy    . Colonoscopy w/ biopsies and polypectomy    . Av fistula placement Left 10/19/2014    Procedure: BRACHIOCEPHALIC ARTERIOVENOUS (AV) FISTULA CREATION ;  Surgeon: Conrad Bremen, MD;  Location: [facility];  Service: Vascular;  Laterality: Left;  . Left and right heart catheterization with coronary angiogram N/A 09/30/2014    Procedure: LEFT AND RIGHT HEART CATHETERIZATION WITH CORONARY ANGIOGRAM;  Surgeon: [name], MD;  Location: [facility];  Service: Cardiovascular;  Laterality: N/A;  . Insertion of dialysis catheter Right 02/02/2015    Procedure: INSERTION OF DIALYSIS CATHETER  RIGHT INTERNAL JUGULAR;  Surgeon: Mal [name], MD;  Location: [facility];  Service: Vascular;  Laterality: Right;  . Cardiac catheterization    . Insert / replace / remove pacemaker  07/11/2015  . Cholecystectomy open  1980's  . Cataract extraction w/ intraocular lens  implant, bilateral Bilateral 1990's  . Coronary angioplasty    . Ep implantable device N/A 07/11/2015    Procedure: Pacemaker Implant;  Surgeon: [name], MD;  Location: [facility];  Service: Cardiovascular;  Laterality: N/A;  . Cardiac catheterization N/A 07/22/2015    Procedure: Right/Left Heart Cath and Coronary Angiography;  Surgeon: [name], MD;  Location: [facility];  Service: Cardiovascular;  Laterality: N/A;  . Transcatheter aortic valve replacement, transfemoral Left 08/02/2015    Procedure: TRANSCATHETER AORTIC VALVE REPLACEMENT, TRANSFEMORAL;  Surgeon: [name], MD;  Location: [facility];  Service: Open Heart Surgery;  Laterality: Left;  . Tee without cardioversion N/A 08/02/2015    Procedure: TRANSESOPHAGEAL ECHOCARDIOGRAM (TEE);  Surgeon: [name], MD;  Location: [facility];  Service: Open Heart Surgery;  Laterality: N/A;       Allergies  Allergen  Reactions  . Byetta 10 Mcg Pen [Exenatide] Diarrhea and Nausea And Vomiting    Social History   Social History  . Marital Status: Married    Spouse Name: N/A  . Number of Children: N/A  . Years of Education: N/A   Occupational History  . Not on file.   Social History Main Topics  . Smoking status: Former Smoker -- 2.00 packs/day for 32 years    Quit date: 11/27/1983  . Smokeless tobacco: Never Used  . Alcohol Use: Yes     Comment: 07/11/2015 "stopped drinking in 09/2014"  . Drug Use: No  . Sexual Activity: Not on file   Other Topics Concern  . Not on file   Social History Narrative    Family History  Problem Relation Age of Onset  . Diabetes Father   . Heart disease Father   . Diabetes Sister     ROS- All systems are reviewed and negative except as per the HPI above  Physical Exam: Telemetry: Filed Vitals:   08/07/15 1303 08/07/15 1430 08/07/15 1500 08/07/15 1530  BP: 102/57 114/48 96/52 103/43  Pulse: 64   64  Temp: 97.8 F (36.6 C)     TempSrc: Oral     Resp: 16  20 15   Height: 5\' 11"  (1.803 m)     Weight: 98.703 kg (217 lb 9.6 oz)     SpO2: 100%   99%    GEN- The patient is chronically ill appearing, alert and oriented x 3 today.   Head- normocephalic, atraumatic Eyes-  Sclera clear, conjunctiva pink Ears- hearing intact Oropharynx- clear Neck- supple, + JVD Lungs- bibasilar rales, normal work of breathing Heart- Regular rate and rhythm, 2/6 SEM GI- soft, NT, ND, + BS Extremities- L groin incision is clear and dry without drainage, moderately tender hematoma at L groin, diffuse ecchymosis over R groin and abdomen,  +1 DP/PT pulses bilaterally, 3 + L leg edena, +1 dependant R leg edema MS- diffuse atrophy Skin- no rash or lesion Psych- euthymic mood, full affect Neuro- strength and sensation are intact  EKG: sinus with V pacing  Labs:   Lab Results  Component Value Date   WBC 6.7 08/07/2015   HGB 8.4* 08/07/2015   HCT 26.5* 08/07/2015    MCV 99.6 08/07/2015   PLT 117* 08/07/2015    Recent Labs Lab 08/01/15 0344  08/07/15 1400  NA 132*  < > 128*  K 4.7  < > 4.2  CL 90*  < > 90*  CO2 24  < > 24  BUN 77*  < > 38*  CREATININE 9.48*  < > 5.81*  CALCIUM 8.4*  < > 7.4*  PROT 6.3*  --   --   BILITOT 1.3*  --   --   ALKPHOS 116  --   --   ALT 606*  --   --   AST 90*  --   --   GLUCOSE 140*  < > 86  < > = values in this interval  not displayed. Lab Results  Component Value Date   TROPONINI 0.64* 07/18/2015    Lab Results  Component Value Date   CHOL 100 04/28/2015   CHOL 120 09/29/2014   CHOL  01/14/2009    146        ATP III CLASSIFICATION:  <200     mg/dL   Desirable  200-239  mg/dL   Borderline High  >=240    mg/dL   High          Lab Results  Component Value Date   HDL 28* 04/28/2015   HDL 32* 09/29/2014   HDL 22* 01/14/2009   Lab Results  Component Value Date   LDLCALC 48 04/28/2015   LDLCALC 56 09/29/2014   LDLCALC  01/14/2009    82        Total Cholesterol/HDL:CHD Risk Coronary Heart Disease Risk Table                     Men   Women  1/2 Average Risk   3.4   3.3  Average Risk       5.0   4.4  2 X Average Risk   9.6   7.1  3 X Average Risk  23.4   11.0        Use the calculated Patient Ratio above and the CHD Risk Table to determine the patient's CHD Risk.        ATP III CLASSIFICATION (LDL):  <100     mg/dL   Optimal  100-129  mg/dL   Near or Above                    Optimal  130-159  mg/dL   Borderline  160-189  mg/dL   High  >190     mg/dL   Very High   Lab Results  Component Value Date   TRIG 121 04/28/2015   TRIG 162* 09/29/2014   TRIG 210* 01/14/2009   Lab Results  Component Value Date   CHOLHDL 3.6 04/28/2015   CHOLHDL 3.8 09/29/2014   CHOLHDL 6.6 01/14/2009   No results found for: LDLDIRECT    Radiology: CXR reveals stable R sided pacemaker, mild worsening of interstitial edema  ASSESSMENT AND PLAN:   1. L leg edema (unilateral) Surgical site is clean and  dry Pulses are ok Would recommend ultrasound of L leg and groin to exclude DVT and also to evaluate for pseudoaneurysm/ fistula (unlikely).  If no DVT, could be discharged with follow-up with Dr Burt Knack (I will alert him of visit to ED).  2. Elevated troponin/ CAD Trivial and without associated symptoms No further workup at this time  3. Acute on chronic systolic dysfunction Appears that he would benefit from additional dialysis.  This can be coordinated by ED team with nephrology As he makes very little urine, I doubt that diuretics would be beneficial at this time.  4. ESRD As above  Anticipate discharge if [place] is ok Please call if I can assist further  Thompson Grayer, MD 08/07/2015  3:53 PM

## 2015-08-07 NOTE — Telephone Encounter (Signed)
    Patient called weekend call service to report new onset left leg swelling. Patient has ESRD on HD and severe AS s/p TAVR 08/02/15. He has new onset swelling in his left foot and calf. Not painful but very tight. No CP or SOB. I am concerned for DVT in the setting of recent surgery and have recommended that he be seen in the ED. He agreed.   Angelena Form PA-C  [facility]

## 2015-08-07 NOTE — ED Notes (Signed)
Pt reports d/c from hospital 08-04-15, s/p transcatheter aortic valve replacement.  Since discharge pt has noticed left leg swelling.  Today pt unable to walk on left leg d/t pain and swelling.  No chest pain, shortness of breath.

## 2015-08-08 DIAGNOSIS — Z95810 Presence of automatic (implantable) cardiac defibrillator: Secondary | ICD-10-CM | POA: Insufficient documentation

## 2015-08-22 ENCOUNTER — Ambulatory Visit
Admission: RE | Admit: 2015-08-22 | Discharge: 2015-08-22 | Disposition: A | Discharge status: Home / Self Care | Payer: Medicare Other | Source: Ambulatory Visit | Attending: Thoracic Surgery (Cardiothoracic Vascular Surgery) | Admitting: Thoracic Surgery (Cardiothoracic Vascular Surgery)

## 2015-08-22 ENCOUNTER — Other Ambulatory Visit: Payer: Self-pay | Admitting: Thoracic Surgery (Cardiothoracic Vascular Surgery)

## 2015-08-22 ENCOUNTER — Ambulatory Visit (INDEPENDENT_AMBULATORY_CARE_PROVIDER_SITE_OTHER): Payer: Medicare Other | Admitting: Physician Assistant

## 2015-08-22 VITALS — BP 130/60 | HR 86 | Resp 20 | Ht 71.0 in | Wt 217.0 lb

## 2015-08-22 DIAGNOSIS — Z954 Presence of other heart-valve replacement: Secondary | ICD-10-CM

## 2015-08-22 DIAGNOSIS — Z952 Presence of prosthetic heart valve: Secondary | ICD-10-CM

## 2015-08-22 DIAGNOSIS — I350 Nonrheumatic aortic (valve) stenosis: Secondary | ICD-10-CM | POA: Diagnosis not present

## 2015-08-22 NOTE — Progress Notes (Addendum)
[address]       [place] [address]             [phone]          HPI: Patient returns for wound check, having undergone TAVR via open left transfemoral approach on 08/02/2015 . The patient's postoperative course was generally uneventful and he was discharged home on 08/05/2015 in good condition.  Since hospital discharge, the patient was seen in the ER at [facility] on 08/07/2015 with increased left leg swelling. He was seen by cardiology at that time and a lower extremity Duplex was performed which was negative for DVT or pseudoaneurysm.    He presents today for routine check of his left groin wound.  He states that he has overall been feeling well since surgery until the last 2-3 days, during which time he has had worsening dyspnea and orthopnea. He has had a mild cough, but no purulent sputum,and has had persistent swelling in his left leg. He denies fevers or chills, chest pain, calf tenderness or dizziness.  He has not had dialysis for the past 4 days, as he skipped today to go to some other appointments this morning.  He states he has been making more urine than usual at home, but denies dysuria.     Current Outpatient Prescriptions  Medication Sig Dispense Refill  . aspirin 81 MG chewable tablet Chew 81 mg by mouth daily.    [name] atorvastatin (LIPITOR) 40 MG tablet Take 1 tablet (40 mg total) by mouth daily at 6 PM. 30 tablet 11  . calcium carbonate (TUMS - DOSED IN MG ELEMENTAL CALCIUM) 500 MG chewable tablet Chew 4 tablets by mouth 3 (three) times daily before meals.     . clopidogrel (PLAVIX) 75 MG tablet Take 1 tablet (75 mg total) by mouth daily with breakfast. 30 tablet 6  . Insulin Detemir (LEVEMIR FLEXTOUCH) 100 UNIT/ML Pen Inject 35 Units into the skin at bedtime.    . lidocaine-prilocaine (EMLA) cream Apply 1 application topically 3 (three) times a week. Apply 1 application onto the left arm as directed    . metoprolol tartrate (LOPRESSOR) 25 MG  tablet Take 0.5 tablets (12.5 mg total) by mouth 2 (two) times daily. 60 tablet 6  . multivitamin (RENA-VIT) TABS tablet Take 1 tablet by mouth daily.    . nitroGLYCERIN (NITROSTAT) 0.4 MG SL tablet Place 0.4 mg under the tongue every 5 (five) minutes as needed for chest pain.     [name] oxyCODONE-acetaminophen (PERCOCET/ROXICET) 5-325 MG per tablet Take 1 tablet by mouth every 6 (six) hours as needed for moderate pain or severe pain. 20 tablet 0  . pantoprazole (PROTONIX) 40 MG tablet Take 1 tablet (40 mg total) by mouth daily. 30 tablet 11   No current facility-administered medications for this visit.     Physical Exam: BP 130/60 HR 86 Resp 20 Wounds: Left groin wound intact without erythema or drainage.  There is resolving ecchymosis. The most distal portion of the incision is macerated, but does not appear infected. Heart: Regular rate and rhythm Lungs: Clear to auscultation Extremities: + bilateral LE edema, L>R   Diagnostic Tests: Chest xray: No results found.     Assessment/Plan: The patient is overall stable from TAVR.  His groin wound appears macerated today but noted infected.  He has not been cleaning the area, stating he was told not to.  I have asked him to shower daily and clean the  area with soap and water and dry thoroughly, then cover with a dry gauze as needed.  His concerns of increased dyspnea and edema appear to be related primarily to volume overload, as he has not had dialysis in 4 days.  We will plan to check a chest x-ray today just to insure that he does not have a new effusion. Ultrasound of the leg from 9/11 did not demonstrate a DVT.  He does have HD tomorrow, and I have encouraged him to get back on schedule and not skip sessions. He may need additional HD sessions in light of his volume overload. This was discussed with Dr. Roxy Manns in the office today. Otherwise, he appears well. Blood pressure is a little higher today than it typically runs (100-110s per patient),  but is not worrisome.  We will see him back at his previously scheduled appointment in 2 weeks, or sooner if he develops further problems.   ADDENDUM: PA/Lateral CXR (9/26)  FINDINGS: RIGHT subclavian sequential pacemaker with leads projecting at RIGHT atrium and RIGHT ventricle.  Enlargement of cardiac silhouette post TAVR.  Slight pulmonary vascular congestion.  Calcified tortuous aorta.  Bibasilar atelectasis.  Remaining lungs clear.  No definite acute infiltrate, pleural effusion, or pneumothorax.  Osseous mineralization normal.  IMPRESSION: Enlargement of cardiac silhouette post TAVR with slight pulmonary vascular congestion.  Minimal bibasilar atelectasis.

## 2015-09-05 ENCOUNTER — Ambulatory Visit (INDEPENDENT_AMBULATORY_CARE_PROVIDER_SITE_OTHER): Payer: Medicare Other | Admitting: Thoracic Surgery (Cardiothoracic Vascular Surgery)

## 2015-09-05 ENCOUNTER — Ambulatory Visit (INDEPENDENT_AMBULATORY_CARE_PROVIDER_SITE_OTHER): Payer: Medicare Other | Admitting: Cardiovascular Disease

## 2015-09-05 ENCOUNTER — Encounter: Payer: Self-pay | Admitting: Thoracic Surgery (Cardiothoracic Vascular Surgery)

## 2015-09-05 ENCOUNTER — Encounter: Payer: Self-pay | Admitting: Cardiovascular Disease

## 2015-09-05 ENCOUNTER — Other Ambulatory Visit: Payer: Self-pay

## 2015-09-05 ENCOUNTER — Ambulatory Visit (HOSPITAL_COMMUNITY): Payer: Medicare Other | Attending: Internal Medicine

## 2015-09-05 VITALS — BP 139/63 | HR 71 | Resp 16 | Ht 71.0 in | Wt 212.0 lb

## 2015-09-05 VITALS — BP 140/70 | HR 70 | Ht 71.0 in | Wt 212.1 lb

## 2015-09-05 DIAGNOSIS — I313 Pericardial effusion (noninflammatory): Secondary | ICD-10-CM | POA: Diagnosis not present

## 2015-09-05 DIAGNOSIS — I340 Nonrheumatic mitral (valve) insufficiency: Secondary | ICD-10-CM | POA: Diagnosis not present

## 2015-09-05 DIAGNOSIS — I120 Hypertensive chronic kidney disease with stage 5 chronic kidney disease or end stage renal disease: Secondary | ICD-10-CM | POA: Diagnosis not present

## 2015-09-05 DIAGNOSIS — E119 Type 2 diabetes mellitus without complications: Secondary | ICD-10-CM | POA: Insufficient documentation

## 2015-09-05 DIAGNOSIS — Z954 Presence of other heart-valve replacement: Secondary | ICD-10-CM

## 2015-09-05 DIAGNOSIS — I25110 Atherosclerotic heart disease of native coronary artery with unstable angina pectoris: Secondary | ICD-10-CM | POA: Diagnosis not present

## 2015-09-05 DIAGNOSIS — I358 Other nonrheumatic aortic valve disorders: Secondary | ICD-10-CM | POA: Insufficient documentation

## 2015-09-05 DIAGNOSIS — S31104A Unspecified open wound of abdominal wall, left lower quadrant without penetration into peritoneal cavity, initial encounter: Secondary | ICD-10-CM | POA: Insufficient documentation

## 2015-09-05 DIAGNOSIS — I517 Cardiomegaly: Secondary | ICD-10-CM | POA: Diagnosis not present

## 2015-09-05 DIAGNOSIS — I350 Nonrheumatic aortic (valve) stenosis: Secondary | ICD-10-CM | POA: Diagnosis not present

## 2015-09-05 DIAGNOSIS — Z992 Dependence on renal dialysis: Secondary | ICD-10-CM | POA: Diagnosis not present

## 2015-09-05 DIAGNOSIS — I10 Essential (primary) hypertension: Secondary | ICD-10-CM | POA: Diagnosis not present

## 2015-09-05 DIAGNOSIS — Z952 Presence of prosthetic heart valve: Secondary | ICD-10-CM | POA: Insufficient documentation

## 2015-09-05 DIAGNOSIS — N186 End stage renal disease: Secondary | ICD-10-CM | POA: Insufficient documentation

## 2015-09-05 DIAGNOSIS — E785 Hyperlipidemia, unspecified: Secondary | ICD-10-CM | POA: Insufficient documentation

## 2015-09-05 MED ORDER — COLLAGENASE 250 UNIT/GM EX OINT: 1.0000 "application " | TOPICAL_OINTMENT | Freq: Every day | CUTANEOUS | Status: DC

## 2015-09-05 MED ORDER — ASPIRIN EC 325 MG PO TBEC
325.0000 mg | DELAYED_RELEASE_TABLET | Freq: Every day | ORAL | Status: DC
Start: 2015-09-05 — End: 2017-05-15

## 2015-09-05 NOTE — Patient Instructions (Addendum)
Medication Instructions:  Your physician has recommended you make the following change in your medication:  1. STOP Plavix (clopidogrel) 2. INCREASE Aspirin 325mg  (enteric coated) take one by mouth daily  Labwork: No new orders.  Testing/Procedures: No new orders.   Follow-Up: Please go to Dr Guy Sandifer office for groin check today.  Your physician recommends that you keep your scheduled follow-up appointment with Dr Caryl Comes.  Your physician has requested that you have an echocardiogram in 1 YEAR. Echocardiography is a painless test that uses sound waves to create images of your heart. It provides your doctor with information about the size and shape of your heart and how well your heart's chambers and valves are working. This procedure takes approximately one hour. There are no restrictions for this procedure.  Your physician wants you to follow-up in: 1 YEAR with Dr Burt Knack.  You will receive a reminder letter in the mail two months in advance. If you don't receive a letter, please call our office to schedule the follow-up appointment.    Any Other Special Instructions Will Be Listed Below (If Applicable).  Your physician discussed the importance of taking an antibiotic prior to any dental, gastrointestinal, genitourinary procedures to prevent damage to the heart valves from infection.

## 2015-09-05 NOTE — Progress Notes (Addendum)
[facility]       CARDIOTHORACIC SURGERY NOTE  Referring Provider is [name], MD Primary Cardiologist is Deboraha Sprang, MD PCP is Thurman Coyer, MD   HPI:  Patient returns to the office today as an unscheduled office visit for wound check status post transcatheter aortic valve replacement via open left transfemoral approach on 08/02/2015. His early postoperative recovery in the hospital was uneventful and he was discharged home on the third postoperative day.  He was seen in the emergency department several days later because of swelling in his left lower extremity.  Noninvasive duplex scan performed at that time was noted to reveal a hematoma in the left groin at that time but no sign of deep venous thrombosis.  He was seen in follow-up in our office on 08/22/2015 at which time he was doing fairly well.  At that time his groin incision appeared macerated on exam but there was no sign of infection. Local wound care instructions were given.  The patient was seen in the office for routine follow-up earlier today by Dr. Burt Knack. Although the patient was doing quite well the appearance of his groin incision was a bit concerning and a follow-up wound check office visit was requested.  The patient reports feeling well overall. He notes that his breathing is considerably better than it was prior to surgery. He has not been having any problems with dialysis. He has no fevers or chills. He has no pain in his groin. There has been no reported drainage from his groin incision.   Current Outpatient Prescriptions  Medication Sig Dispense Refill  . aspirin EC 325 MG tablet Take 1 tablet (325 mg total) by mouth daily. 1 tablet 0  . atorvastatin (LIPITOR) 40 MG tablet Take 1 tablet (40 mg total) by mouth daily at 6 PM. 30 tablet 11  . calcium carbonate (TUMS - DOSED IN MG ELEMENTAL CALCIUM) 500 MG chewable tablet Chew 4 tablets by mouth 3  (three) times daily before meals.     . Insulin Detemir (LEVEMIR FLEXTOUCH) 100 UNIT/ML Pen Inject 35 Units into the skin at bedtime.    . lidocaine-prilocaine (EMLA) cream Apply 1 application topically 3 (three) times a week. Apply 1 application onto the left arm as directed    . metoprolol tartrate (LOPRESSOR) 25 MG tablet Take 0.5 tablets (12.5 mg total) by mouth 2 (two) times daily. 60 tablet 6  . multivitamin (RENA-VIT) TABS tablet Take 1 tablet by mouth daily.    . nitroGLYCERIN (NITROSTAT) 0.4 MG SL tablet Place 0.4 mg under the tongue every 5 (five) minutes as needed for chest pain.     . pantoprazole (PROTONIX) 40 MG tablet Take 1 tablet (40 mg total) by mouth daily. 30 tablet 11  . oxyCODONE-acetaminophen (PERCOCET/ROXICET) 5-325 MG per tablet Take 1 tablet by mouth every 6 (six) hours as needed for moderate pain or severe pain. 20 tablet 0   No current facility-administered medications for this visit.      Physical Exam:   BP 139/63 mmHg  Pulse 71  Resp 16  Ht 5\' 11"  (1.803 m)  Wt 212 lb (96.163 kg)  BMI 29.58 kg/m2  SpO2 98%  General:  Well-appearing  Chest:   clear  CV:   Regular rate and rhythm with soft systolic murmur  Incisions:  Skin edge necrosis involving much of the left groin incision is noted without any signs of soft tissue infection or cellulitis at  this time  Abdomen:  Soft and nontender  Extremities:  Warm and well-perfused  Diagnostic Tests:  n/a   Impression:  Patient has skin edge necrosis involving his left groin incision without any signs for wound infection.  Plan:  I have given the patient a prescription for Santyl ointment to apply once daily to his skin incision. He has been instructed to wash using an antimicrobial soap and water.  The patient will return in 1 week for follow-up and wound check.   I spent in excess of 10 minutes during the conduct of this office consultation and >50% of this time involved direct face-to-face encounter  with the patient for counseling and/or coordination of their care.    Valentina Gu. Roxy Manns, MD 09/05/2015 1:47 PM

## 2015-09-05 NOTE — Patient Instructions (Signed)
Keep incision clean and dry.  Wash with antibacterial soap and water.  Apply Santyl ointment once daily

## 2015-09-07 ENCOUNTER — Encounter: Payer: Self-pay | Admitting: Cardiovascular Disease

## 2015-09-07 NOTE — Progress Notes (Signed)
Cardiology Office Note Date:  09/07/2015   ID:  Jeffrey [name], DOB May 27, 1941, MRN [id number]  PCP:  Thurman Coyer, MD  Cardiologist:  [name], MD    Chief Complaint  Patient presents with  . Follow-up    Aortic stenosis post-TAVR     History of Present Illness: Jeffrey [name] is a 74 y.o. male who presents for 30-day TAVR follow-up. The patient has a complex medical history with ESRD, severe symptomatic aortic stenosis, hx stroke, coronary artery disease with chronic occlusion of the RCA. He underwent PPM placement in August 2016. He then underwent TAVR evaluation after presenting with acute on chronic systolic and diastolic heart failure and ultimately was treated with a 26 mm Sapien 3 THV via a left transfemoral approach 08/02/2015.   He is doing well overall. He's tolerating dialysis well. No chest pain or shortness of breath with activity. Energy level is improved. He is concerned about his groin incision and notes some redness there. He denies tenderness or fever. He does complain of itching all over since he left the hospital. No rash. This is his biggest complaint today.   Past Medical History  Diagnosis Date  . Hyperlipidemia   . Hypertension   . Aortic stenosis     a. severe by echo 09/2014  . Atrial fibrillation ([place])     a. not well documented, not on anticoagulation  . Coronary artery disease     a. chronically occluded RCA per cath 09/2014 with collaterals  . Peripheral vascular disease ([place])   . Claustrophobia   . Complete heart block ([place])   . Presence of permanent cardiac pacemaker   . Anginal pain ([place])   . Myocardial infarction ([place]) 10/2014  . Pneumonia   . Type II diabetes mellitus ([place])   . Iron deficiency anemia   . History of blood transfusion     "related to gallbladder OR"  . History of stomach ulcers   . CVA (cerebral vascular accident) ([place]) 10/2014    denies residual on 07/11/2015  . ESRD (end stage renal disease) on dialysis [facility])      a. on dialysis; [facility]; MWF, LUE fistula (07/11/2015)  . CHF (congestive heart failure) ([place])   . Renal insufficiency   . 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 Esophagogastroduodenoscopy  08/01/2012    Procedure: ESOPHAGOGASTRODUODENOSCOPY (EGD);  Surgeon: [name], MD;  Location: [facility];  Service: Endoscopy;  Laterality: N/A;  . Tonsillectomy    . Colonoscopy w/ biopsies and polypectomy    . Av fistula placement Left 10/19/2014    Procedure: BRACHIOCEPHALIC ARTERIOVENOUS (AV) FISTULA CREATION ;  Surgeon: Conrad , MD;  Location: [facility];  Service: Vascular;  Laterality: Left;  . Left and right heart catheterization with coronary angiogram N/A 09/30/2014    Procedure: LEFT AND RIGHT HEART CATHETERIZATION WITH CORONARY ANGIOGRAM;  Surgeon: [name], MD;  Location: [facility] CATH LAB;  Service: Cardiovascular;  Laterality: N/A;  . Insertion of dialysis catheter Right 02/02/2015    Procedure: INSERTION OF DIALYSIS CATHETER  RIGHT INTERNAL JUGULAR;  Surgeon: Mal [name], MD;  Location: [facility];  Service: Vascular;  Laterality: Right;  . Cardiac catheterization    . Insert / replace / remove pacemaker  07/11/2015  . Cholecystectomy open  1980's  . Cataract  extraction w/ intraocular lens  implant, bilateral Bilateral 1990's  . Coronary angioplasty    . Ep implantable device N/A 07/11/2015    Procedure: Pacemaker Implant;  Surgeon: [name], MD;  Location: [place] CV LAB;  Service: Cardiovascular;  Laterality: N/A;  . Cardiac catheterization N/A 07/22/2015    Procedure: Right/Left Heart Cath and Coronary Angiography;  Surgeon: [name], MD;  Location: [place] CV LAB;  Service: Cardiovascular;  Laterality: N/A;  . Transcatheter aortic valve replacement, transfemoral Left 08/02/2015     Procedure: TRANSCATHETER AORTIC VALVE REPLACEMENT, TRANSFEMORAL;  Surgeon: [name], MD;  Location: [place] [facility];  Service: Open Heart Surgery;  Laterality: Left;  . Tee without cardioversion N/A 08/02/2015    Procedure: TRANSESOPHAGEAL ECHOCARDIOGRAM (TEE);  Surgeon: [name], MD;  Location: [facility];  Service: Open Heart Surgery;  Laterality: N/A;    Current Outpatient Prescriptions  Medication Sig Dispense Refill  . atorvastatin (LIPITOR) 40 MG tablet Take 1 tablet (40 mg total) by mouth daily at 6 PM. 30 tablet 11  . calcium carbonate (TUMS - DOSED IN MG ELEMENTAL CALCIUM) 500 MG chewable tablet Chew 4 tablets by mouth 3 (three) times daily before meals.     . Insulin Detemir (LEVEMIR FLEXTOUCH) 100 UNIT/ML Pen Inject 35 Units into the skin at bedtime.    . lidocaine-prilocaine (EMLA) cream Apply 1 application topically 3 (three) times a week. Apply 1 application onto the left arm as directed    . metoprolol tartrate (LOPRESSOR) 25 MG tablet Take 0.5 tablets (12.5 mg total) by mouth 2 (two) times daily. 60 tablet 6  . multivitamin (RENA-VIT) TABS tablet Take 1 tablet by mouth daily.    . nitroGLYCERIN (NITROSTAT) 0.4 MG SL tablet Place 0.4 mg under the tongue every 5 (five) minutes as needed for chest pain.     [name] oxyCODONE-acetaminophen (PERCOCET/ROXICET) 5-325 MG per tablet Take 1 tablet by mouth every 6 (six) hours as needed for moderate pain or severe pain. 20 tablet 0  . pantoprazole (PROTONIX) 40 MG tablet Take 1 tablet (40 mg total) by mouth daily. 30 tablet 11  . aspirin EC 325 MG tablet Take 1 tablet (325 mg total) by mouth daily. 1 tablet 0  . collagenase (SANTYL) ointment Apply 1 application topically daily. 15 g 0   No current facility-administered medications for this visit.    Allergies:   Byetta 10 mcg pen   Social History:  The patient  reports that he quit smoking about 31 years ago. He has never used smokeless tobacco. He reports that he drinks alcohol. He reports  that he does not use illicit drugs.   Family History:  The patient's  family history includes Diabetes in his father and sister; Heart disease in his father.    ROS:  Please see the history of present illness.  Otherwise, review of systems is positive for fatigue.  All other systems are reviewed and negative.    PHYSICAL EXAM: VS:  BP 140/70 mmHg  Pulse 70  Ht 5\' 11"  (1.803 m)  Wt 212 lb 1.9 oz (96.217 kg)  BMI 29.60 kg/m2 , BMI Body mass index is 29.6 kg/(m^2). GEN: Well nourished, well developed, in no acute distress HEENT: normal Neck: no JVD, no masses.  Cardiac: RRR with 2/6 SEM at the RUSB, no diastolic murmur                Respiratory:  clear to auscultation bilaterally, normal work of breathing GI: soft, nontender, nondistended, +  BS MS: no deformity or atrophy Ext: trace pretibial edema Skin: warm and dry, no rash. Left groin incision with erythema or discharge Neuro:  Strength and sensation are intact Psych: euthymic mood, full affect  EKG:  EKG is ordered today. The ekg ordered today shows AV-sequential pacing  Recent Labs: 04/27/2015: TSH 1.422 08/01/2015: ALT 606* 08/03/2015: Magnesium 2.0 08/07/2015: B Natriuretic Peptide 3748.2*; BUN 38*; Creatinine, Ser 5.81*; Hemoglobin 8.4*; Platelets 117*; Potassium 4.2; Sodium 128*   Lipid Panel     Component Value Date/Time   CHOL 100 04/28/2015 0703   TRIG 121 04/28/2015 0703   HDL 28* 04/28/2015 0703   CHOLHDL 3.6 04/28/2015 0703   VLDL 24 04/28/2015 0703   LDLCALC 48 04/28/2015 0703      Wt Readings from Last 3 Encounters:  09/05/15 212 lb (96.163 kg)  09/05/15 212 lb 1.9 oz (96.217 kg)  08/22/15 217 lb (98.431 kg)     Cardiac Studies Reviewed: 2D Echo: Study Conclusions  - Left ventricle: The cavity size was normal. Wall thickness was increased in a pattern of mild LVH. Systolic function was normal. The estimated ejection fraction was in the range of 55% to 60%. - Aortic valve: AV prosthesis opens  well Peak and mean gradients through the valve are 22 and 14 mm Hg respectivley. There is a small perivalvular leak. - Mitral valve: Calcified annulus. Mildly thickened leaflets . There was mild regurgitation. - Left atrium: The atrium was severely dilated. - Pericardium, extracardiac: A small pericardial effusion was identified.  ASSESSMENT AND PLAN: Aortic valve disease s/p TAVR - pt progressing very well now one month out from TAVR. He has marked improvement in his functional capacity, now NYHA I. Echo reviewed and shows normal valve function with mild paravalvular leak. Will repeat echo in one year. He will go to Dr Guy Sandifer office from here to have his left groin incision checked. I have asked him to stop plavix because I suspect diffuse itching is a drug reaction/allergy. He will increase ASA dose to 325 mg daily. He will call if symptoms don't resolve in 7-10 days. He understands the need to follow SBE prophylaxis per guidelines.   Current medicines are reviewed with the patient today.  The patient does not have concerns regarding medicines.  Labs/ tests ordered today include:   Orders Placed This Encounter  Procedures  . EKG 12-Lead    Disposition:   FU one year with a repeat echo and Valve Clinic appt.  Deatra James, MD  09/07/2015 6:02 AM    [address] [place], [place], [place]  [address] Phone: [phone]; Fax: [phone]

## 2015-09-12 ENCOUNTER — Encounter: Payer: Self-pay | Admitting: Thoracic Surgery (Cardiothoracic Vascular Surgery)

## 2015-09-12 ENCOUNTER — Ambulatory Visit (INDEPENDENT_AMBULATORY_CARE_PROVIDER_SITE_OTHER): Payer: Medicare Other | Admitting: Thoracic Surgery (Cardiothoracic Vascular Surgery)

## 2015-09-12 VITALS — BP 186/66 | HR 57 | Resp 16 | Ht 71.0 in | Wt 212.0 lb

## 2015-09-12 DIAGNOSIS — I350 Nonrheumatic aortic (valve) stenosis: Secondary | ICD-10-CM

## 2015-09-12 DIAGNOSIS — S31104D Unspecified open wound of abdominal wall, left lower quadrant without penetration into peritoneal cavity, subsequent encounter: Secondary | ICD-10-CM

## 2015-09-12 DIAGNOSIS — Z954 Presence of other heart-valve replacement: Secondary | ICD-10-CM

## 2015-09-12 DIAGNOSIS — Z952 Presence of prosthetic heart valve: Secondary | ICD-10-CM

## 2015-09-12 NOTE — Patient Instructions (Signed)
Stop Santyl application when wound VAC therapy starts

## 2015-09-12 NOTE — Progress Notes (Signed)
[address]       [place] [address]             [phone]     CARDIOTHORACIC SURGERY OFFICE NOTE  Referring Provider is [name], MD Primary Cardiologist is Deboraha Sprang, MD PCP is Thurman Coyer, MD   HPI:  Patient returns to the office for wound check status post transcatheter aortic valve replacement via open left transfemoral approach on 08/02/2015. He was seen in the office last week and noted to have skin edge necrosis with poor wound healing but no sign of ongoing wound infection. Local wound care was prescribed using Santyl ointment to facilitate enzymatic debridement of the devitalized skin edges. The patient returns to the office for wound check today. He reports that he feels quite well. He has no shortness of breath. He has no pain. He has not had fevers or chills.   Current Outpatient Prescriptions  Medication Sig Dispense Refill  . aspirin EC 325 MG tablet Take 1 tablet (325 mg total) by mouth daily. 1 tablet 0  . atorvastatin (LIPITOR) 40 MG tablet Take 1 tablet (40 mg total) by mouth daily at 6 PM. 30 tablet 11  . calcium carbonate (TUMS - DOSED IN MG ELEMENTAL CALCIUM) 500 MG chewable tablet Chew 4 tablets by mouth 3 (three) times daily before meals.     . collagenase (SANTYL) ointment Apply 1 application topically daily. 15 g 0  . Insulin Detemir (LEVEMIR FLEXTOUCH) 100 UNIT/ML Pen Inject 35 Units into the skin at bedtime.    . lidocaine-prilocaine (EMLA) cream Apply 1 application topically 3 (three) times a week. Apply 1 application onto the left arm as directed    . metoprolol tartrate (LOPRESSOR) 25 MG tablet Take 0.5 tablets (12.5 mg total) by mouth 2 (two) times daily. 60 tablet 6  . multivitamin (RENA-VIT) TABS tablet Take 1 tablet by mouth daily.    . nitroGLYCERIN (NITROSTAT) 0.4 MG SL tablet Place 0.4 mg under the tongue every 5 (five) minutes as needed for chest pain.     [name] oxyCODONE-acetaminophen (PERCOCET/ROXICET)  5-325 MG per tablet Take 1 tablet by mouth every 6 (six) hours as needed for moderate pain or severe pain. 20 tablet 0  . pantoprazole (PROTONIX) 40 MG tablet Take 1 tablet (40 mg total) by mouth daily. 30 tablet 11   No current facility-administered medications for this visit.      Physical Exam:   BP 186/66 mmHg  Pulse 57  Resp 16  Ht 5\' 11"  (1.803 m)  Wt 212 lb (96.163 kg)  BMI 29.58 kg/m2  SpO2 98%  General:  Well-appearing  Chest:   Clear  CV:   Regular rate and rhythm  Incisions:  Incomplete enzymatic breakdown of devitalized skin edges with evidence of continued skin edge necrosis and failure of local wound healing without signs of infection.  Abdomen:  Soft and nontender  Extremities:  Warm and well-perfused  Minor Procedure:  Sharp excisional debridement is performed on the patient's left groin wound to excise all devitalized tissue and necrotic skin edges. There is no sign of infection present. There is no purulence in the base of the wound. After completion of debridement the wound measures 6 cm in length by 2 cm in width and 1 cm in maximum depth.   Impression:  Nonhealing left groin wound status post excisional debridement of skin edge necrosis and devitalized tissues without sign of active infection. I feel this wound  would best be treated using negative pressure wound therapy.  Plan:  We will arrange for home health nursing care to begin wound VAC placement as soon as practical. The patient will return in 3 weeks for follow-up and wound check.  I spent in excess of 15 minutes during the conduct of this office consultation and >50% of this time involved direct face-to-face encounter with the patient for counseling and/or coordination of their care.    Valentina Gu. Roxy Manns, MD 09/12/2015 10:01 AM

## 2015-10-03 ENCOUNTER — Encounter: Payer: Medicare Other | Admitting: Thoracic Surgery (Cardiothoracic Vascular Surgery)

## 2015-10-04 ENCOUNTER — Encounter: Payer: Self-pay | Admitting: Thoracic Surgery (Cardiothoracic Vascular Surgery)

## 2015-10-04 ENCOUNTER — Ambulatory Visit (INDEPENDENT_AMBULATORY_CARE_PROVIDER_SITE_OTHER): Payer: Medicare Other | Admitting: Thoracic Surgery (Cardiothoracic Vascular Surgery)

## 2015-10-04 VITALS — BP 156/69 | HR 82 | Resp 18 | Ht 71.0 in | Wt 201.0 lb

## 2015-10-04 DIAGNOSIS — Z954 Presence of other heart-valve replacement: Secondary | ICD-10-CM

## 2015-10-04 DIAGNOSIS — Z952 Presence of prosthetic heart valve: Secondary | ICD-10-CM

## 2015-10-04 DIAGNOSIS — S31104D Unspecified open wound of abdominal wall, left lower quadrant without penetration into peritoneal cavity, subsequent encounter: Secondary | ICD-10-CM

## 2015-10-04 DIAGNOSIS — I350 Nonrheumatic aortic (valve) stenosis: Secondary | ICD-10-CM | POA: Diagnosis not present

## 2015-10-04 NOTE — Progress Notes (Signed)
[address]       [place], [address]             [phone]     CARDIOTHORACIC SURGERY OFFICE NOTE  Referring Provider is [name], MD Primary Cardiologist is Deboraha Sprang, MD PCP is Thurman Coyer, MD   HPI:  Patient returns to the office for wound check status post transcatheter aortic valve replacement via open left transfemoral approach on 08/02/2015. Postoperatively the patient developed skin edge necrosis of his left groin wound which ultimately required surgical debridement and application of wound VAC. He was last seen in our office on 09/12/2015. He returns to the office today stating that he is doing exceptionally well. He states that he feels dramatically better than he did prior to surgery. He is not having any exertional shortness of breath or chest pain. Dialysis is apparently going very well. He has not had any fevers or chills. There have not been any problems reported by home health care regarding wound management.   Current Outpatient Prescriptions  Medication Sig Dispense Refill  . aspirin EC 325 MG tablet Take 1 tablet (325 mg total) by mouth daily. 1 tablet 0  . atorvastatin (LIPITOR) 40 MG tablet Take 1 tablet (40 mg total) by mouth daily at 6 PM. 30 tablet 11  . calcium carbonate (TUMS - DOSED IN MG ELEMENTAL CALCIUM) 500 MG chewable tablet Chew 4 tablets by mouth 3 (three) times daily before meals.     . Insulin Detemir (LEVEMIR FLEXTOUCH) 100 UNIT/ML Pen Inject 35 Units into the skin at bedtime.    . lidocaine-prilocaine (EMLA) cream Apply 1 application topically 3 (three) times a week. Apply 1 application onto the left arm as directed    . metoprolol tartrate (LOPRESSOR) 25 MG tablet Take 0.5 tablets (12.5 mg total) by mouth 2 (two) times daily. 60 tablet 6  . multivitamin (RENA-VIT) TABS tablet Take 1 tablet by mouth daily.    . nitroGLYCERIN (NITROSTAT) 0.4 MG SL tablet Place 0.4 mg under the tongue every 5 (five) minutes as  needed for chest pain.     [name] oxyCODONE-acetaminophen (PERCOCET/ROXICET) 5-325 MG per tablet Take 1 tablet by mouth every 6 (six) hours as needed for moderate pain or severe pain. 20 tablet 0  . pantoprazole (PROTONIX) 40 MG tablet Take 1 tablet (40 mg total) by mouth daily. 30 tablet 11   No current facility-administered medications for this visit.      Physical Exam:   BP 156/69 mmHg  Pulse 82  Resp 18  Ht 5\' 11"  (1.803 m)  Wt 201 lb (91.173 kg)  BMI 28.05 kg/m2  SpO2 91%  General:  Elderly but well-appearing  Chest:   Clear to auscultation  CV:   Regular rate and rhythm  Incisions:  Left groin wound is healing nicely. The wound has granulated in considerably. The wound now measures 1.5 x 2 cm in length. It is very shallow, measuring less than 4 mm in depth. There is no cellulitis or sign of infection.  Abdomen:  Soft nontender  Extremities:  Warm and well-perfused  Diagnostic Tests:  n/a   Impression:  Excellent progress with left groin wound.  Plan:  I recommend continuing negative pressure wound therapy for at least another week or so. I'm hopeful that within the next 1-2 weeks the wound will granulate in to the point where the wound VAC could be discontinued.  However, I would be reluctant to stop at  this point. The patient will return in 3 weeks for follow-up wound check.  I spent in excess of 10 minutes during the conduct of this office consultation and >50% of this time involved direct face-to-face encounter with the patient for counseling and/or coordination of their care.    Valentina Gu. Roxy Manns, MD 10/04/2015 10:30 AM

## 2015-10-08 DIAGNOSIS — Z48812 Encounter for surgical aftercare following surgery on the circulatory system: Secondary | ICD-10-CM | POA: Diagnosis not present

## 2015-10-11 ENCOUNTER — Encounter: Payer: Medicare Other | Admitting: Internal Medicine

## 2015-10-24 ENCOUNTER — Ambulatory Visit (INDEPENDENT_AMBULATORY_CARE_PROVIDER_SITE_OTHER): Payer: Medicare Other | Admitting: Physician Assistant

## 2015-10-24 ENCOUNTER — Encounter: Payer: Medicare Other | Admitting: Thoracic Surgery (Cardiothoracic Vascular Surgery)

## 2015-10-24 VITALS — BP 138/52 | HR 76 | Resp 16 | Ht 71.0 in | Wt 212.5 lb

## 2015-10-24 DIAGNOSIS — I350 Nonrheumatic aortic (valve) stenosis: Secondary | ICD-10-CM

## 2015-10-24 DIAGNOSIS — Z954 Presence of other heart-valve replacement: Secondary | ICD-10-CM | POA: Diagnosis not present

## 2015-10-24 DIAGNOSIS — Z952 Presence of prosthetic heart valve: Secondary | ICD-10-CM

## 2015-10-24 NOTE — Progress Notes (Signed)
  HPI:  Patient returns for  postoperative follow-up having undergone an  VAC placement to left groin for skin edge necrosis. He initially underwent a TAVR via left transfemoral approach on 08/02/2015 by Dr. Roxy Manns and Burt Knack. He developed left lower leg swelling after being discharged from the hospital and was lower extremity duplex US was negative for DVT. He was then seen for routine follow up afterward and had a left groin wound incision that appeared to be macerated, but no  signs of infection. He was given local wound care instructions. He returned to see Dr. Roxy Manns on 10/10 for follow up of the left groin wound. He developed skin edge necrosis of the left groin wound. He was instructed to use Santyl ointment to help with enzymatic debridement of the devitalized skin edges. Again, he had no sign of local wound infection. It was then decided to use negative pressure wound therapy and a wound VAC was placed. He was last seen on 11/08. The left groin was granulating, no cellulitis, and it measured 1.5 x 2 cm in length and was less than 4 mm deep. He presents today for follow up of left groin wound. Patient denies fever, chills, or drainage.  Current Outpatient Prescriptions  Medication Sig Dispense Refill  . aspirin EC 325 MG tablet Take 1 tablet (325 mg total) by mouth daily. 1 tablet 0  . atorvastatin (LIPITOR) 40 MG tablet Take 1 tablet (40 mg total) by mouth daily at 6 PM. 30 tablet 11  . calcium carbonate (TUMS - DOSED IN MG ELEMENTAL CALCIUM) 500 MG chewable tablet Chew 4 tablets by mouth 3 (three) times daily before meals.     . Insulin Detemir (LEVEMIR FLEXTOUCH) 100 UNIT/ML Pen Inject 35 Units into the skin at bedtime.    . lidocaine-prilocaine (EMLA) cream Apply 1 application topically 3 (three) times a week. Apply 1 application onto the left arm as directed    . metoprolol tartrate (LOPRESSOR) 25 MG tablet Take 0.5 tablets (12.5 mg total) by mouth 2 (two) times daily. 60 tablet 6  . multivitamin  (RENA-VIT) TABS tablet Take 1 tablet by mouth daily.    . nitroGLYCERIN (NITROSTAT) 0.4 MG SL tablet Place 0.4 mg under the tongue every 5 (five) minutes as needed for chest pain.     [name] oxyCODONE-acetaminophen (PERCOCET/ROXICET) 5-325 MG per tablet Take 1 tablet by mouth every 6 (six) hours as needed for moderate pain or severe pain. 20 tablet 0  . pantoprazole (PROTONIX) 40 MG tablet Take 1 tablet (40 mg total) by mouth daily. 30 tablet 11  Vital Signs: BP 138/52, HR 76, RR 16, oxygen saturation 90% on room air  Physical Exam: CV-RRR with a grade II/VI systolic ejection murmur heard best at right upper sternal border Pulmonary-Clear Extremities-Trace LE edema Wound-left groin wound is clean and dry, completely closed   Impression and Plan: Mr. Langenberg left groin wound has healed completely. He will return to see Dr. Roxy Manns PRN. He will continue to be followed by Dr. Burt Knack. I told him to call Dr. Antionette Char office for a follow up in the next 1-2 months.    Nani Skillern, PA-C [facility] [phone]

## 2015-12-13 ENCOUNTER — Encounter: Payer: Medicare Other | Admitting: Internal Medicine

## 2015-12-14 ENCOUNTER — Encounter: Payer: Self-pay | Admitting: Internal Medicine

## 2015-12-29 IMAGING — CR DG CHEST 2V
2 series · 2 of 2 positions shown · non-contrast
Comparison: 08/07/2015

CLINICAL DATA: Post AVR on 08/02/2015, weakness, shortness of
breath

EXAM:
CHEST  2 VIEW

[w chest pa]
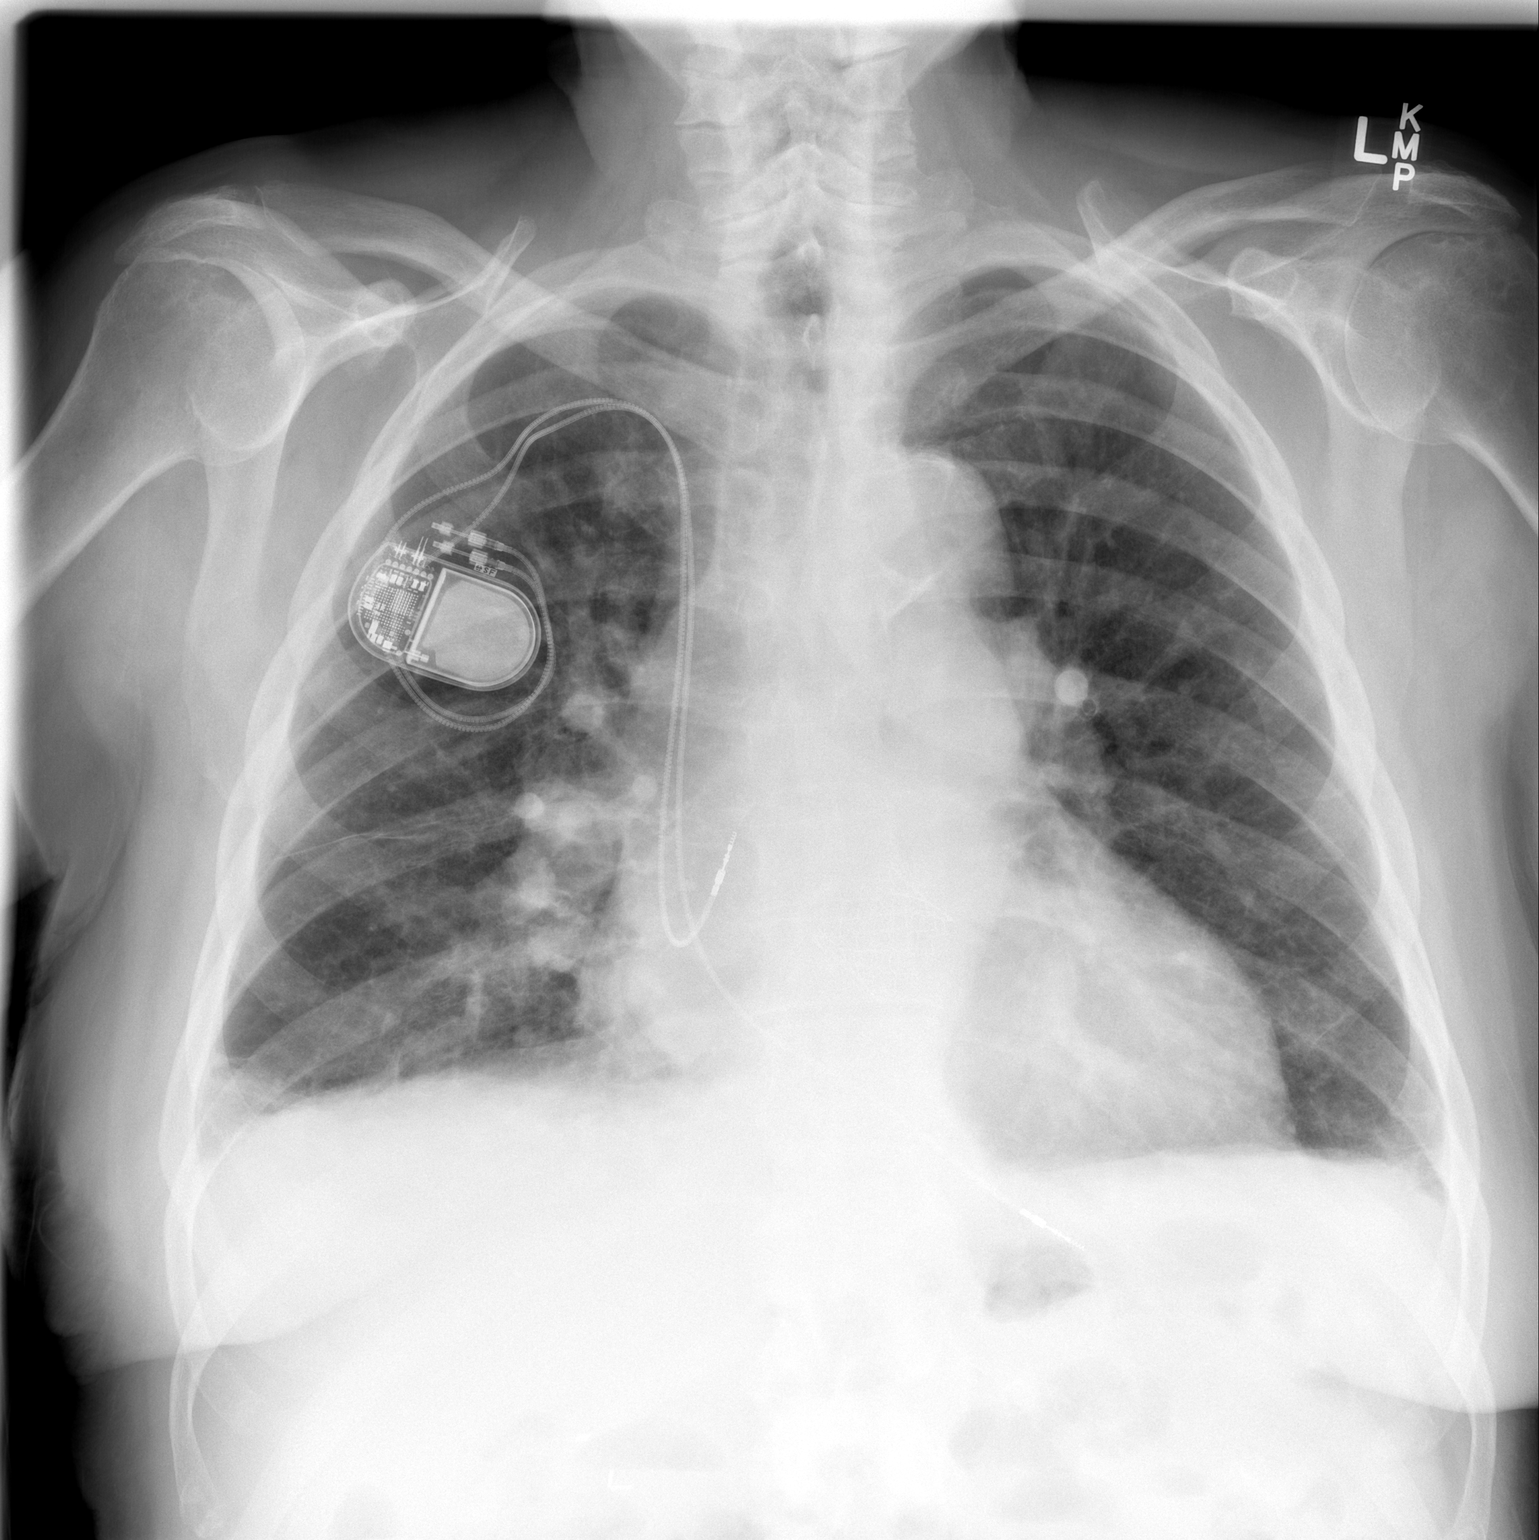

[w chest lat]
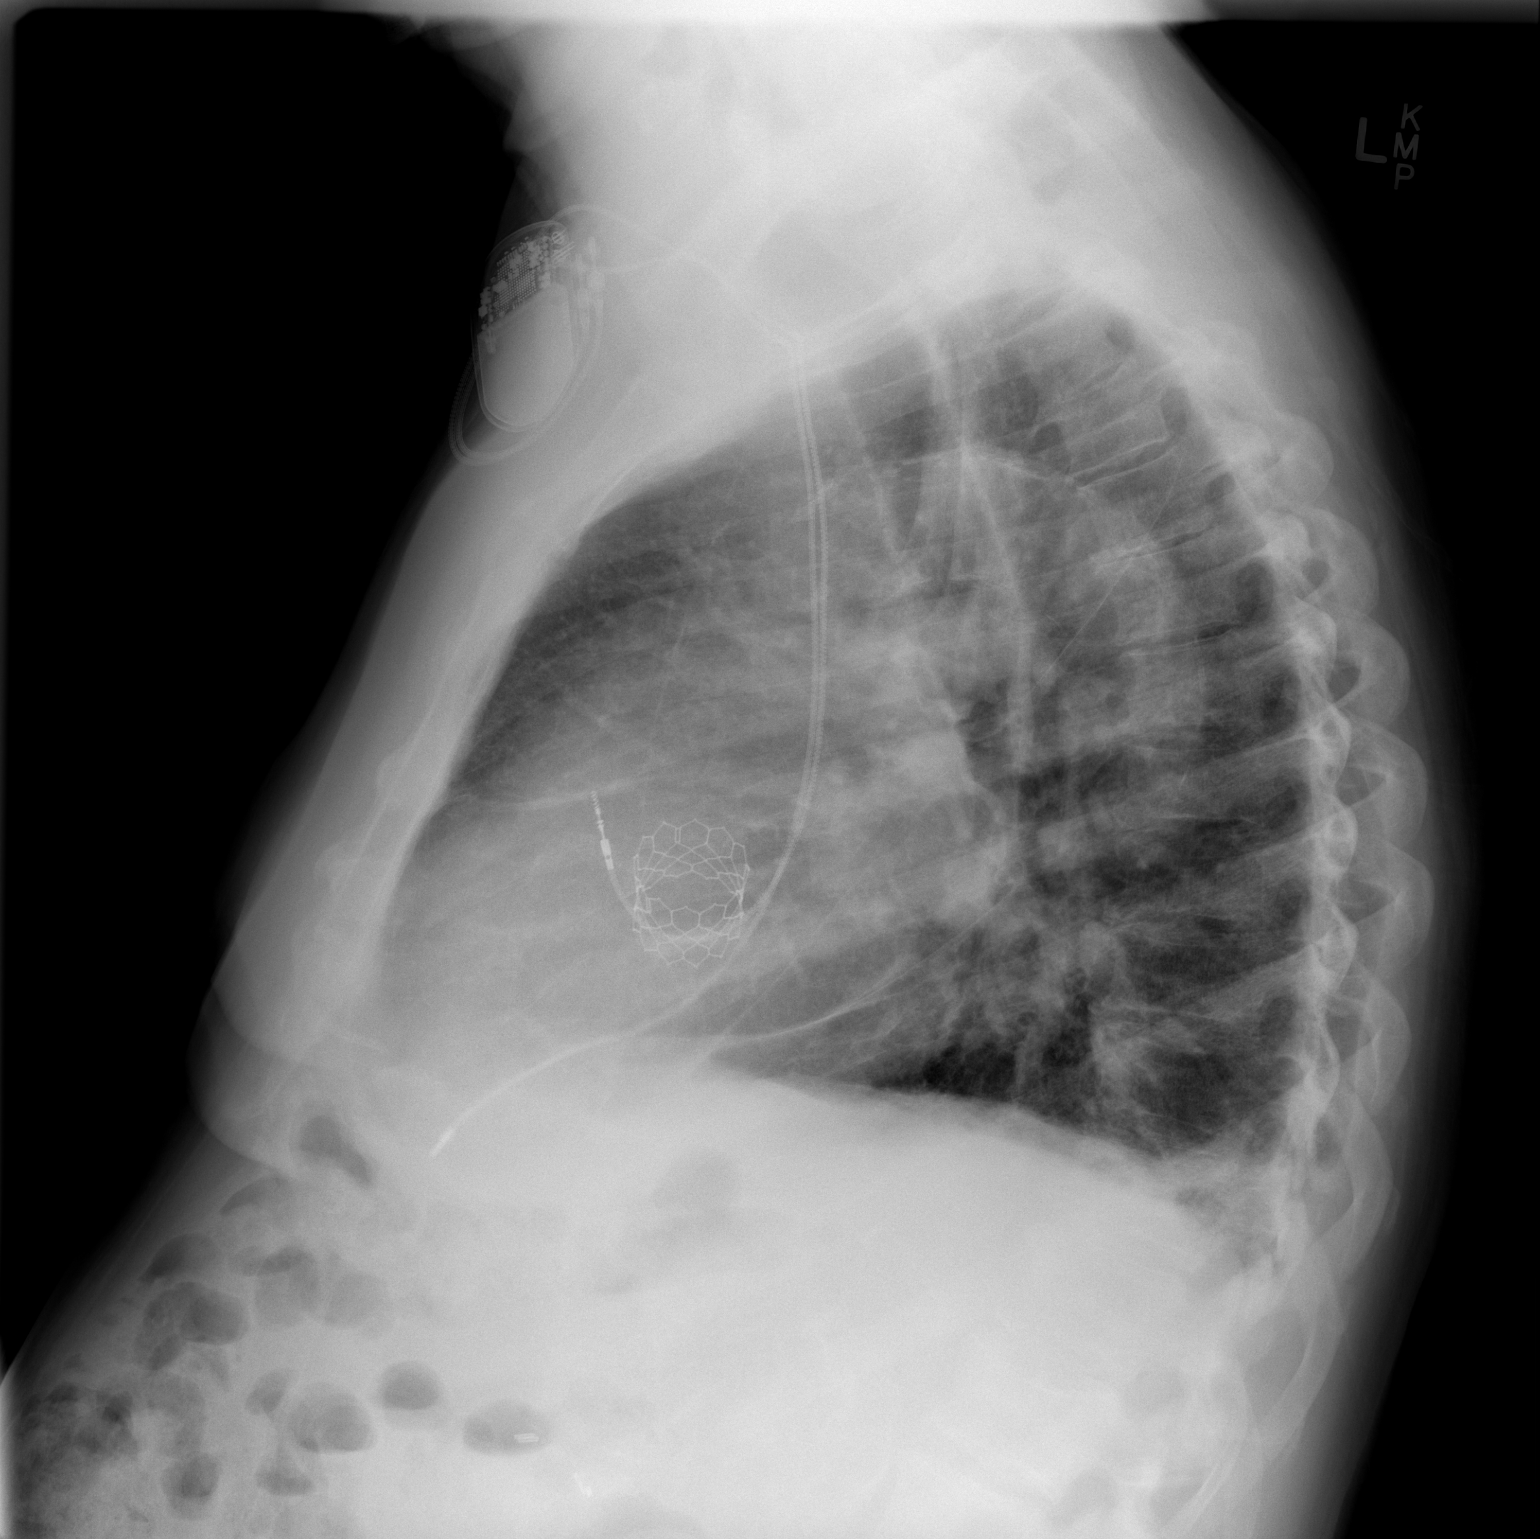

[2 of 2 positions shown; findings below may reference images not displayed]

FINDINGS: RIGHT subclavian sequential pacemaker with leads projecting at RIGHT
atrium and RIGHT ventricle.

Enlargement of cardiac silhouette post TAVR.

Slight pulmonary vascular congestion.

Calcified tortuous aorta.

Bibasilar atelectasis.

Remaining lungs clear.

No definite acute infiltrate, pleural effusion, or pneumothorax.

Osseous mineralization normal.
IMPRESSION: Enlargement of cardiac silhouette post TAVR with slight pulmonary
vascular congestion.

Minimal bibasilar atelectasis.

## 2015-12-30 ENCOUNTER — Encounter: Payer: Self-pay | Admitting: Internal Medicine

## 2016-01-06 ENCOUNTER — Encounter: Payer: Self-pay | Admitting: Internal Medicine

## 2016-02-02 ENCOUNTER — Encounter: Payer: Self-pay | Admitting: Internal Medicine

## 2016-02-02 ENCOUNTER — Ambulatory Visit (INDEPENDENT_AMBULATORY_CARE_PROVIDER_SITE_OTHER): Payer: Medicare Other | Admitting: Internal Medicine

## 2016-02-02 VITALS — BP 138/72 | HR 69 | Ht 71.0 in | Wt 225.0 lb

## 2016-02-02 DIAGNOSIS — Z950 Presence of cardiac pacemaker: Secondary | ICD-10-CM

## 2016-02-02 DIAGNOSIS — R001 Bradycardia, unspecified: Secondary | ICD-10-CM | POA: Diagnosis not present

## 2016-02-02 DIAGNOSIS — I442 Atrioventricular block, complete: Secondary | ICD-10-CM | POA: Diagnosis not present

## 2016-02-02 NOTE — Patient Instructions (Signed)
Medication Instructions:  Your physician recommends that you continue on your current medications as directed. Please refer to the Current Medication list given to you today.  Labwork: None ordered  Testing/Procedures: None ordered  Follow-Up: Remote monitoring is used to monitor your Pacemaker of ICD from home. This monitoring reduces the number of office visits required to check your device to one time per year. It allows [place] to keep an eye on the functioning of your device to ensure it is working properly. You are scheduled for a device check from home on 05/02/2016. You may send your transmission at any time that day. If you have a wireless device, the transmission will be sent automatically. After your physician reviews your transmission, you will receive a postcard with your next transmission date.  Your physician wants you to follow-up in: August with Chanetta Marshall, NP.  You will receive a reminder letter in the mail two months in advance. If you don't receive a letter, please call our office to schedule the follow-up appointment.  If you need a refill on your cardiac medications before your next appointment, please call your pharmacy.  Thank you for choosing [facility]!!

## 2016-02-02 NOTE — Progress Notes (Signed)
Patient Care Team: Thurman Coyer, MD as PCP - General (Internal Medicine) Deboraha Sprang, MD as PCP - Cardiology (Cardiology) Fleet Contras, MD as Consulting Physician (Nephrology)   HPI  Jeffrey [name] is a 75 y.o. 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  He had been having bradycardia associated chest pain during dialysis. This would resolve with normalization of heart rate.  He is also seeing Dr. Billee Cashing.  Initially his aortic stenosis was felt to be exaggerated because of bradycardia; however, given ongoing symptoms following pacing he underwent TAVR. This was associated with significant interval improvement. He received a sapien 3  He has developed CHB  LVEF post TAVR 55-60%  ATrial fibrillation noted on device--up to 14 hrs  Not withstanding all of that, he has done incredibly well. He is walking now with just a cane. He denies significant shortness of breath. His issues with dialysis are also much less problematic   Catheterization 11/15 demonstrated an occluded right coronary artery with left-right collaterals.  Past Medical History  Diagnosis Date  . Hyperlipidemia   . Hypertension   . Aortic stenosis     a. severe by echo 09/2014  . Atrial fibrillation ([place])     a. not well documented, not on anticoagulation  . Coronary artery disease     a. chronically occluded RCA per cath 09/2014 with collaterals  . Peripheral vascular disease ([place])   . Claustrophobia   . Complete heart block ([place])   . Presence of permanent cardiac pacemaker   . Anginal pain ([place])   . Myocardial infarction ([place]) 10/2014  . Pneumonia   . Type II diabetes mellitus ([place])   . Iron deficiency anemia   . History of blood transfusion     "related to gallbladder OR"  . History of  stomach ulcers   . CVA (cerebral vascular accident) ([place]) 10/2014    denies residual on 07/11/2015  . ESRD (end stage renal disease) on dialysis [facility])     a. on dialysis; [facility]; MWF, LUE fistula (07/11/2015)  . CHF (congestive heart failure) ([place])   . Renal insufficiency   . 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 Esophagogastroduodenoscopy  08/01/2012    Procedure: ESOPHAGOGASTRODUODENOSCOPY (EGD);  Surgeon: [name], MD;  Location: [facility];  Service: Endoscopy;  Laterality: N/A;  . Tonsillectomy    . Colonoscopy w/ biopsies and polypectomy    . Av fistula placement Left 10/19/2014    Procedure: BRACHIOCEPHALIC ARTERIOVENOUS (AV) FISTULA CREATION ;  Surgeon: Conrad Beallsville, MD;  Location: [place];  Service: Vascular;  Laterality: Left;  . Left and right heart catheterization with coronary angiogram N/A 09/30/2014    Procedure: LEFT AND RIGHT HEART CATHETERIZATION WITH CORONARY ANGIOGRAM;  Surgeon: [name], MD;  Location: [facility] CATH LAB;  Service: Cardiovascular;  Laterality: N/A;  . Insertion of dialysis catheter Right 02/02/2015    Procedure: INSERTION OF DIALYSIS CATHETER  RIGHT INTERNAL JUGULAR;  Surgeon: Mal [name], MD;  Location: [facility];  Service: Vascular;  Laterality: Right;  . Cardiac catheterization    . Insert / replace / remove pacemaker  07/11/2015  . Cholecystectomy open  1980's  . Cataract extraction w/ intraocular lens  implant, bilateral Bilateral 1990's  . Coronary angioplasty    . Ep implantable device N/A 07/11/2015    Procedure: Pacemaker Implant;  Surgeon: [name], MD;  Location: [facility] CV LAB;  Service: Cardiovascular;  Laterality: N/A;  . Cardiac catheterization N/A 07/22/2015    Procedure: Right/Left Heart Cath and Coronary Angiography;  Surgeon: [name], MD;  Location: [facility] CV LAB;  Service: Cardiovascular;  Laterality: N/A;  . Transcatheter aortic valve replacement, transfemoral Left 08/02/2015    Procedure: TRANSCATHETER AORTIC VALVE REPLACEMENT, TRANSFEMORAL;  Surgeon: [name], MD;  Location: [facility];  Service: Open Heart Surgery;  Laterality: Left;  . Tee without cardioversion N/A 08/02/2015    Procedure: TRANSESOPHAGEAL ECHOCARDIOGRAM (TEE);  Surgeon: [name], MD;  Location: [facility];  Service: Open Heart Surgery;  Laterality: N/A;    Current Outpatient Prescriptions  Medication Sig Dispense Refill  . aspirin EC 325 MG tablet Take 1 tablet (325 mg total) by mouth daily. 1 tablet 0  . atorvastatin (LIPITOR) 40 MG tablet Take 1 tablet (40 mg total) by mouth daily at 6 PM. 30 tablet 11  . Insulin Detemir (LEVEMIR FLEXTOUCH) 100 UNIT/ML Pen Inject 35 Units into the skin at bedtime.    . lidocaine-prilocaine (EMLA) cream Apply 1 application topically 3 (three) times a week. Apply 1 application onto the left arm as directed    . metoprolol tartrate (LOPRESSOR) 25 MG tablet Take 0.5 tablets (12.5 mg total) by mouth 2 (two) times daily. 60 tablet 6  . nitroGLYCERIN (NITROSTAT) 0.4 MG SL tablet Place 0.4 mg under the tongue every 5 (five) minutes as needed for chest pain.     . pantoprazole (PROTONIX) 40 MG tablet Take 1 tablet (40 mg total) by mouth daily. 30 tablet 11  . RENVELA 800 MG tablet Take 3 tablets by mouth 3 (three) times daily before meals.     No current facility-administered medications for this visit.    Allergies  Allergen Reactions  . Byetta 10 Mcg Pen [Exenatide] Diarrhea and Nausea And Vomiting    Review of Systems negative except from HPI and PMH  Physical Exam BP 138/72 mmHg  Pulse 69  Ht 5\' 11"  (1.803 m)  Wt 225 lb (102.059 kg)  BMI 31.39 kg/m2 Well developed and well nourished in no acute distress HENT normal E scleral and icterus clear Neck Supple JVP flat; carotids delayed Dialysis  shunt in left arm Clear to ausculation Irregular and slow rate and rhythm,  2/6 systolic m Soft with active bowel sounds No clubbing cyanosis Trace Edema Alert and oriented, grossly normal motor and sensory function Skin Warm and Dry  ECG was ordered today demonstrated sinus rhythm  with PVCs synchronous pacing with a PR interval of 360 ms   Assessment and  Plan  Complete heart block    Pacemaker Biotronik  Aortic stenosis s/p TAVR  Atrial fibrillation-paroxysmal  Ventricular Tachycardia  End-stage renal disease  He is much improved following device implantation. He has unfortunately developed complete heart block and has a very broad QRS. We will need to be attended to the possibility of pacemaker in his cardiomyopathy.  His atrial fibrillation is of moderate duration about 13 hours. The questions that is unknown is the duration of atrial fibrillation that should prompt therapy. In the normal population this is in the range of probably 6-12 hours. In the end-stage renal dialysis population, i.e.  would think that that number should be longer based on the information that we have about the benefits of Coumadin being less than the risk of bleeding being more. I will arbitrarily establish a threshold of about 36 hours.  Ventricular tachycardia-nonsustained was identified. In the context of post TAVR normal LVEF, this is something we would simply observe.  Event that he has symptoms of worsening shortness of breath, we would anticipate reassessing LV function

## 2016-02-07 LAB — CUP PACEART INCLINIC DEVICE CHECK
Date Time Interrogation Session: 20170314172122
Implantable Lead Implant Date: 20160815
Implantable Lead Location: 753860
Implantable Lead Model: 5076
MDC IDC LEAD IMPLANT DT: 20160815
MDC IDC LEAD LOCATION: 753859
MDC IDC PG SERIAL: 68600861

## 2016-02-09 NOTE — Addendum Note (Signed)
Addended by: Freada Bergeron on: 02/09/2016 03:17 PM   Modules accepted: Orders

## 2016-04-10 ENCOUNTER — Encounter: Payer: Self-pay | Admitting: Internal Medicine

## 2016-05-03 ENCOUNTER — Ambulatory Visit (INDEPENDENT_AMBULATORY_CARE_PROVIDER_SITE_OTHER): Payer: Medicare Other | Admitting: *Deleted

## 2016-05-03 DIAGNOSIS — I442 Atrioventricular block, complete: Secondary | ICD-10-CM

## 2016-05-03 NOTE — Progress Notes (Signed)
Remote pacemaker transmission.   

## 2016-05-07 ENCOUNTER — Encounter: Payer: Self-pay | Admitting: Internal Medicine

## 2016-05-11 LAB — CUP PACEART REMOTE DEVICE CHECK
Date Time Interrogation Session: 20170616153958
Implantable Lead Location: 753860
Implantable Lead Model: 5076
Lead Channel Impedance Value: 410 Ohm
Lead Channel Impedance Value: 429 Ohm
Lead Channel Pacing Threshold Pulse Width: 0.4 ms
MDC IDC LEAD IMPLANT DT: 20160815
MDC IDC LEAD IMPLANT DT: 20160815
MDC IDC LEAD LOCATION: 753859
MDC IDC MSMT LEADCHNL RA PACING THRESHOLD AMPLITUDE: 1.1 V
MDC IDC MSMT LEADCHNL RA SENSING INTR AMPL: 2.7 mV
MDC IDC MSMT LEADCHNL RV PACING THRESHOLD AMPLITUDE: 0.7 V
MDC IDC MSMT LEADCHNL RV PACING THRESHOLD PULSEWIDTH: 0.4 ms
MDC IDC MSMT LEADCHNL RV SENSING INTR AMPL: 14.5 mV
MDC IDC STAT BRADY RA PERCENT PACED: 42 %
MDC IDC STAT BRADY RV PERCENT PACED: 99 %
Pulse Gen Model: 394929
Pulse Gen Serial Number: 68600861

## 2016-05-16 ENCOUNTER — Encounter: Payer: Self-pay | Admitting: Cardiology

## 2016-07-19 ENCOUNTER — Encounter (INDEPENDENT_AMBULATORY_CARE_PROVIDER_SITE_OTHER): Payer: Self-pay

## 2016-07-19 ENCOUNTER — Ambulatory Visit (HOSPITAL_COMMUNITY): Payer: Medicare Other | Attending: Cardiology

## 2016-07-19 ENCOUNTER — Other Ambulatory Visit: Payer: Self-pay

## 2016-07-19 ENCOUNTER — Other Ambulatory Visit: Payer: Self-pay | Admitting: Cardiovascular Disease

## 2016-07-19 ENCOUNTER — Ambulatory Visit: Payer: Medicare Other | Admitting: Cardiovascular Disease

## 2016-07-19 DIAGNOSIS — I132 Hypertensive heart and chronic kidney disease with heart failure and with stage 5 chronic kidney disease, or end stage renal disease: Secondary | ICD-10-CM | POA: Insufficient documentation

## 2016-07-19 DIAGNOSIS — I2510 Atherosclerotic heart disease of native coronary artery without angina pectoris: Secondary | ICD-10-CM | POA: Diagnosis not present

## 2016-07-19 DIAGNOSIS — I359 Nonrheumatic aortic valve disorder, unspecified: Secondary | ICD-10-CM | POA: Diagnosis present

## 2016-07-19 DIAGNOSIS — Z952 Presence of prosthetic heart valve: Secondary | ICD-10-CM

## 2016-07-19 DIAGNOSIS — E785 Hyperlipidemia, unspecified: Secondary | ICD-10-CM | POA: Diagnosis not present

## 2016-07-19 DIAGNOSIS — Z953 Presence of xenogenic heart valve: Secondary | ICD-10-CM | POA: Diagnosis not present

## 2016-07-19 DIAGNOSIS — N186 End stage renal disease: Secondary | ICD-10-CM | POA: Diagnosis not present

## 2016-07-19 DIAGNOSIS — I77810 Thoracic aortic ectasia: Secondary | ICD-10-CM | POA: Diagnosis not present

## 2016-07-19 DIAGNOSIS — I340 Nonrheumatic mitral (valve) insufficiency: Secondary | ICD-10-CM | POA: Diagnosis not present

## 2016-07-19 DIAGNOSIS — Z954 Presence of other heart-valve replacement: Secondary | ICD-10-CM

## 2016-07-19 DIAGNOSIS — Z87891 Personal history of nicotine dependence: Secondary | ICD-10-CM | POA: Diagnosis not present

## 2016-07-19 DIAGNOSIS — I509 Heart failure, unspecified: Secondary | ICD-10-CM | POA: Insufficient documentation

## 2016-07-19 DIAGNOSIS — Z8249 Family history of ischemic heart disease and other diseases of the circulatory system: Secondary | ICD-10-CM | POA: Insufficient documentation

## 2016-07-19 DIAGNOSIS — E1122 Type 2 diabetes mellitus with diabetic chronic kidney disease: Secondary | ICD-10-CM | POA: Insufficient documentation

## 2016-07-19 DIAGNOSIS — I351 Nonrheumatic aortic (valve) insufficiency: Secondary | ICD-10-CM | POA: Insufficient documentation

## 2016-07-25 NOTE — Progress Notes (Signed)
Electrophysiology Office Note Date: 07/26/2016  ID:  Jeffrey [name], DOB 1941/02/22, MRN [id number]  PCP: Thurman Coyer, MD Primary Cardiologist: Burt Knack Electrophysiologist: Caryl Comes  CC: Pacemaker follow-up  Jeffrey [name] is a 75 y.o. male seen today for Dr Caryl Comes.  He presents today for routine electrophysiology followup.  Since last being seen in our clinic, the patient reports doing reasonably well.  He had a mechanical fall 3 weeks ago.  He denies chest pain, palpitations, dyspnea, PND, orthopnea, nausea, vomiting, dizziness, syncope, edema, weight gain, or early satiety.  Device History: Biotronik dual chamber PPM implanted 2016 for complete heart block    Past Medical History:  Diagnosis Date  . Anginal pain ([place])   . Aortic stenosis    a. severe by echo 09/2014  . Atrial fibrillation ([place])    a. not well documented, not on anticoagulation  . CHF (congestive heart failure) ([place])   . Claustrophobia   . Complete heart block ([place])   . Coronary artery disease    a. chronically occluded RCA per cath 09/2014 with collaterals  . CVA (cerebral vascular accident) ([place]) 10/2014   denies residual on 07/11/2015  . ESRD (end stage renal disease) on dialysis [facility])    a. on dialysis; [facility]; MWF, LUE fistula (07/11/2015)  . History of blood transfusion    "related to gallbladder OR"  . History of stomach ulcers   . Hyperlipidemia   . Hypertension   . Iron deficiency anemia   . Myocardial infarction ([place]) 10/2014  . Peripheral vascular disease ([place])   . Pneumonia   . Presence of permanent cardiac pacemaker   . Renal insufficiency   . 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 Type II diabetes mellitus ([place])    Past Surgical History:  Procedure Laterality Date  . AV FISTULA PLACEMENT Left 10/19/2014   Procedure: BRACHIOCEPHALIC ARTERIOVENOUS (AV) FISTULA  CREATION ;  Surgeon: Conrad Coinjock, MD;  Location: [facility];  Service: Vascular;  Laterality: Left;  . CARDIAC CATHETERIZATION    . CARDIAC CATHETERIZATION N/A 07/22/2015   Procedure: Right/Left Heart Cath and Coronary Angiography;  Surgeon: [name], MD;  Location: [place] CV LAB;  Service: Cardiovascular;  Laterality: N/A;  . CATARACT EXTRACTION W/ INTRAOCULAR LENS  IMPLANT, BILATERAL Bilateral 1990's  . CHOLECYSTECTOMY OPEN  1980's  . COLONOSCOPY W/ BIOPSIES AND POLYPECTOMY    . CORONARY ANGIOPLASTY    . EP IMPLANTABLE DEVICE N/A 07/11/2015   Procedure: Pacemaker Implant;  Surgeon: [name], MD;  Location: [place] CV LAB;  Service: Cardiovascular;  Laterality: N/A;  . ESOPHAGOGASTRODUODENOSCOPY  08/01/2012   Procedure: ESOPHAGOGASTRODUODENOSCOPY (EGD);  Surgeon: [name], MD;  Location: [facility];  Service: Endoscopy;  Laterality: N/A;  . INSERT / REPLACE / REMOVE PACEMAKER  07/11/2015  . INSERTION OF DIALYSIS CATHETER Right 02/02/2015   Procedure: INSERTION OF DIALYSIS CATHETER  RIGHT INTERNAL JUGULAR;  Surgeon: Mal [name], MD;  Location: [facility];  Service: Vascular;  Laterality: Right;  . LEFT AND RIGHT HEART CATHETERIZATION WITH CORONARY ANGIOGRAM N/A 09/30/2014   Procedure: LEFT AND RIGHT HEART CATHETERIZATION WITH CORONARY ANGIOGRAM;  Surgeon: [name], MD;  Location: [facility] CATH LAB;  Service: Cardiovascular;  Laterality: N/A;  . TEE WITHOUT CARDIOVERSION N/A 08/02/2015   Procedure: TRANSESOPHAGEAL ECHOCARDIOGRAM (TEE);  Surgeon: [name], MD;  Location: [facility];  Service: Open  Heart Surgery;  Laterality: N/A;  . TONSILLECTOMY    . TRANSCATHETER AORTIC VALVE REPLACEMENT, TRANSFEMORAL Left 08/02/2015   Procedure: TRANSCATHETER AORTIC VALVE REPLACEMENT, TRANSFEMORAL;  Surgeon: [name], MD;  Location: [facility];  Service: Open Heart Surgery;  Laterality: Left;    Current Outpatient Prescriptions  Medication Sig Dispense Refill  . amLODipine (NORVASC)  10 MG tablet Take 1 tablet by mouth daily.    [name] aspirin EC 325 MG tablet Take 1 tablet (325 mg total) by mouth daily. 1 tablet 0  . atorvastatin (LIPITOR) 40 MG tablet Take 1 tablet (40 mg total) by mouth daily at 6 PM. 30 tablet 11  . Insulin Detemir (LEVEMIR FLEXTOUCH) 100 UNIT/ML Pen Inject 35 Units into the skin at bedtime.    . lidocaine-prilocaine (EMLA) cream Apply 1 application topically 3 (three) times a week. Apply 1 application onto the left arm as directed    . metoprolol tartrate (LOPRESSOR) 25 MG tablet Take 0.5 tablets (12.5 mg total) by mouth 2 (two) times daily. 60 tablet 6  . nitroGLYCERIN (NITROSTAT) 0.4 MG SL tablet Place 0.4 mg under the tongue every 5 (five) minutes as needed for chest pain.     . pantoprazole (PROTONIX) 40 MG tablet Take 1 tablet (40 mg total) by mouth daily. 30 tablet 11  . RENVELA 800 MG tablet Take 3 tablets by mouth 3 (three) times daily before meals.     No current facility-administered medications for this visit.     Allergies:   Byetta 10 mcg pen [exenatide]   Social History: Social History   Social History  . Marital status: Married    Spouse name: N/A  . Number of children: N/A  . Years of education: N/A   Occupational History  . Not on file.   Social History Main Topics  . Smoking status: Former Smoker    Packs/day: 2.00    Years: 32.00    Quit date: 11/27/1983  . Smokeless tobacco: Never Used  . Alcohol use Yes     Comment: 07/11/2015 "stopped drinking in 09/2014"  . Drug use: No  . Sexual activity: Not on file   Other Topics Concern  . Not on file   Social History Narrative  . No narrative on file    Family History: Family History  Problem Relation Age of Onset  . Diabetes Father   . Heart disease Father   . Diabetes Sister      Review of Systems: All other systems reviewed and are otherwise negative except as noted above.   Physical Exam: VS:  BP (!) 136/58   Pulse 76   Ht 5' 11.5" (1.816 m)   Wt 232 lb  1.9 oz (105.3 kg)   SpO2 95%   BMI 31.92 kg/m  , BMI Body mass index is 31.92 kg/m.  GEN- The patient is elderly, chronically ill and disheveled appearing, alert and oriented x 3 today.   HEENT: normocephalic, atraumatic; sclera clear, conjunctiva pink; hearing intact; oropharynx clear; neck supple Lungs- Clear to ausculation bilaterally, normal work of breathing.  No wheezes, rales, rhonchi Heart- Regular rate and rhythm GI- soft, non-tender, non-distended, bowel sounds present Extremities- no clubbing, cyanosis, or edema MS- no significant deformity or atrophy Skin- warm and dry, no rash or lesion; PPM pocket well healed, RUE fistula Psych- euthymic mood, full affect Neuro- strength and sensation are intact  PPM Interrogation- reviewed in detail today,  See PACEART report  EKG:  EKG is not ordered today.  Recent  Labs: 08/01/2015: ALT 606 08/03/2015: Magnesium 2.0 08/07/2015: B Natriuretic Peptide 3,748.2; BUN 38; Creatinine, Ser 5.81; Hemoglobin 8.4; Platelets 117; Potassium 4.2; Sodium 128   Wt Readings from Last 3 Encounters:  07/26/16 232 lb 1.9 oz (105.3 kg)  02/02/16 225 lb (102.1 kg)  10/24/15 212 lb 8.4 oz (96.4 kg)     Other studies Reviewed: Additional studies/ records that were reviewed today include: Dr Caryl Comes and Dr Antionette Char office notes  Assessment and Plan:  1.  Complete heart block Normal PPM function See Pace Art report No changes today  2.  Paroxysmal atrial fibrillation Burden by device interrogation today <1% Per Dr Olin Pia last note, will consider warfarin if AF episodes last longer than 36 hours CHADS2VASC is 6  3.  Aortic stenosis s/p TAVR Followed by Dr Burt Knack Stable by echo 06/2016  4.  ESRD On HD   Current medicines are reviewed at length with the patient today.   The patient does not have concerns regarding his medicines.  The following changes were made today:  none  Labs/ tests ordered today include: none No orders of the defined types  were placed in this encounter.    Disposition:   Follow up with home monitoring, Dr Caryl Comes 1 year     Signed, Chanetta Marshall, NP 07/26/2016 10:13 AM  [place] [address] [phone] (office) [phone] (fax)

## 2016-07-26 ENCOUNTER — Encounter: Payer: Self-pay | Admitting: Nurse Practitioner

## 2016-07-26 ENCOUNTER — Ambulatory Visit (INDEPENDENT_AMBULATORY_CARE_PROVIDER_SITE_OTHER): Payer: Medicare Other | Admitting: Nurse Practitioner

## 2016-07-26 ENCOUNTER — Encounter (INDEPENDENT_AMBULATORY_CARE_PROVIDER_SITE_OTHER): Payer: Self-pay

## 2016-07-26 VITALS — BP 136/58 | HR 76 | Ht 71.5 in | Wt 232.1 lb

## 2016-07-26 DIAGNOSIS — Z954 Presence of other heart-valve replacement: Secondary | ICD-10-CM | POA: Diagnosis not present

## 2016-07-26 DIAGNOSIS — I442 Atrioventricular block, complete: Secondary | ICD-10-CM

## 2016-07-26 DIAGNOSIS — I480 Paroxysmal atrial fibrillation: Secondary | ICD-10-CM

## 2016-07-26 DIAGNOSIS — Z952 Presence of prosthetic heart valve: Secondary | ICD-10-CM

## 2016-07-26 DIAGNOSIS — N186 End stage renal disease: Secondary | ICD-10-CM | POA: Diagnosis not present

## 2016-07-26 DIAGNOSIS — Z992 Dependence on renal dialysis: Secondary | ICD-10-CM

## 2016-07-26 NOTE — Patient Instructions (Signed)
Medication Instructions:  Your physician recommends that you continue on your current medications as directed. Please refer to the Current Medication list given to you today.   Labwork: None ordered  Testing/Procedures: None ordered  Follow-Up: Your physician wants you to follow-up in:  [place] will receive a reminder letter in the mail two months in advance. If you don't receive a letter, please call our office to schedule the follow-up appointment.  Remote monitoring is used to monitor your Pacemaker of ICD from home. This monitoring reduces the number of office visits required to check your device to one time per year. It allows [place] to keep an eye on the functioning of your device to ensure it is working properly. You are scheduled for a device check from home on 10/25/2016. You may send your transmission at any time that day. If you have a wireless device, the transmission will be sent automatically. After your physician reviews your transmission, you will receive a postcard with your next transmission date.    Any Other Special Instructions Will Be Listed Below (If Applicable).   If you need a refill on your cardiac medications before your next appointment, please call your pharmacy.

## 2016-07-27 LAB — CUP PACEART INCLINIC DEVICE CHECK
Date Time Interrogation Session: 20170901081252
Implantable Lead Implant Date: 20160815
Implantable Lead Location: 753860
Implantable Lead Model: 5076
MDC IDC LEAD IMPLANT DT: 20160815
MDC IDC LEAD LOCATION: 753859
Pulse Gen Serial Number: 68600861

## 2016-08-03 ENCOUNTER — Telehealth: Payer: Self-pay | Admitting: Cardiovascular Disease

## 2016-08-03 NOTE — Telephone Encounter (Signed)
Follow Up:     Returning call from yesterday,not sure who called him.

## 2016-08-03 NOTE — Telephone Encounter (Signed)
Informed pt of echo results. Pt verbalized understanding.

## 2016-08-07 ENCOUNTER — Encounter: Payer: Self-pay | Admitting: Cardiovascular Disease

## 2016-08-07 ENCOUNTER — Ambulatory Visit (INDEPENDENT_AMBULATORY_CARE_PROVIDER_SITE_OTHER): Payer: Medicare Other | Admitting: Cardiovascular Disease

## 2016-08-07 VITALS — BP 150/62 | HR 73 | Ht 71.5 in | Wt 234.0 lb

## 2016-08-07 DIAGNOSIS — Z954 Presence of other heart-valve replacement: Secondary | ICD-10-CM | POA: Diagnosis not present

## 2016-08-07 DIAGNOSIS — I350 Nonrheumatic aortic (valve) stenosis: Secondary | ICD-10-CM | POA: Diagnosis not present

## 2016-08-07 DIAGNOSIS — Z952 Presence of prosthetic heart valve: Secondary | ICD-10-CM

## 2016-08-07 NOTE — Progress Notes (Signed)
Cardiology Office Note Date:  08/07/2016   ID:  Jeffrey [name], DOB 07/11/1941, MRN [id number]  PCP:  Thurman Coyer, MD  Cardiologist:  [name], MD    Chief Complaint  Patient presents with  . Yearly Cardiac Exam    no sx   History of Present Illness: Jeffrey [name] is a 75 y.o. male who presents for one year TAVR follow-up. The patient has a complex medical history with ESRD, severe symptomatic aortic stenosis, hx of stroke, and coronary artery disease with chronic occlusion of the RCA. He underwent PPM placement in August 2016. He then underwent TAVR evaluation after presenting with acute on chronic systolic and diastolic heart failure and ultimately was treated with a 26 mm Sapien 3 THV via a left transfemoral approach 08/02/2015.   He's doing quite well. He is remodeling his home on his own and is able to do physical work. He doesn't do much walking but denies dyspnea with exertion. He occasionally has shortness of breath with hot weather, but otherwise has no complaints. He denies chest pain. No recent NTG use. No edema, orthopnea, or PND.   Past Medical History:  Diagnosis Date  . Anginal pain ([place])   . Aortic stenosis    a. severe by echo 09/2014  . Atrial fibrillation ([place])    a. not well documented, not on anticoagulation  . CHF (congestive heart failure) ([place])   . Claustrophobia   . Complete heart block ([place])   . Coronary artery disease    a. chronically occluded RCA per cath 09/2014 with collaterals  . CVA (cerebral vascular accident) ([place]) 10/2014   denies residual on 07/11/2015  . ESRD (end stage renal disease) on dialysis [name])    a. on dialysis; [facility]; MWF, LUE fistula (07/11/2015)  . History of blood transfusion    "related to gallbladder OR"  . History of stomach ulcers   . Hyperlipidemia   . Hypertension   . Iron deficiency anemia   . Myocardial infarction ([employer]) 10/2014  . Peripheral vascular disease ([place])   . Pneumonia   . Presence of  permanent cardiac pacemaker   . Renal insufficiency   . 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 Type II diabetes mellitus ([place])     Past Surgical History:  Procedure Laterality Date  . AV FISTULA PLACEMENT Left 10/19/2014   Procedure: BRACHIOCEPHALIC ARTERIOVENOUS (AV) FISTULA CREATION ;  Surgeon: Conrad , MD;  Location: [facility];  Service: Vascular;  Laterality: Left;  . CARDIAC CATHETERIZATION    . CARDIAC CATHETERIZATION N/A 07/22/2015   Procedure: Right/Left Heart Cath and Coronary Angiography;  Surgeon: [name], MD;  Location: [facility] CV LAB;  Service: Cardiovascular;  Laterality: N/A;  . CATARACT EXTRACTION W/ INTRAOCULAR LENS  IMPLANT, BILATERAL Bilateral 1990's  . CHOLECYSTECTOMY OPEN  1980's  . COLONOSCOPY W/ BIOPSIES AND POLYPECTOMY    . CORONARY ANGIOPLASTY    . EP IMPLANTABLE DEVICE N/A 07/11/2015   Procedure: Pacemaker Implant;  Surgeon: [name], MD;  Location: [place] CV LAB;  Service: Cardiovascular;  Laterality: N/A;  . ESOPHAGOGASTRODUODENOSCOPY  08/01/2012   Procedure: ESOPHAGOGASTRODUODENOSCOPY (EGD);  Surgeon: [name], MD;  Location: [facility];  Service: Endoscopy;  Laterality: N/A;  . INSERT / REPLACE / REMOVE PACEMAKER  07/11/2015  . INSERTION OF DIALYSIS CATHETER Right 02/02/2015   Procedure: INSERTION OF DIALYSIS  CATHETER  RIGHT INTERNAL JUGULAR;  Surgeon: Mal [name], MD;  Location: [facility];  Service: Vascular;  Laterality: Right;  . LEFT AND RIGHT HEART CATHETERIZATION WITH CORONARY ANGIOGRAM N/A 09/30/2014   Procedure: LEFT AND RIGHT HEART CATHETERIZATION WITH CORONARY ANGIOGRAM;  Surgeon: [name], MD;  Location[facility];  Service: Cardiovascular;  Laterality: N/A;  . TEE WITHOUT CARDIOVERSION N/A 08/02/2015   Procedure: TRANSESOPHAGEAL ECHOCARDIOGRAM (TEE);  Surgeon: [name], MD;  Location: [facility];  Service: Open Heart Surgery;  Laterality: N/A;  . TONSILLECTOMY    . TRANSCATHETER AORTIC VALVE REPLACEMENT, TRANSFEMORAL Left 08/02/2015   Procedure: TRANSCATHETER AORTIC VALVE REPLACEMENT, TRANSFEMORAL;  Surgeon: [name], MD;  Location: [place];  Service: Open Heart Surgery;  Laterality: Left;    Current Outpatient Prescriptions  Medication Sig Dispense Refill  . amLODipine (NORVASC) 10 MG tablet Take 1 tablet by mouth daily.    [name] aspirin EC 325 MG tablet Take 1 tablet (325 mg total) by mouth daily. 1 tablet 0  . atorvastatin (LIPITOR) 40 MG tablet Take 1 tablet (40 mg total) by mouth daily at 6 PM. 30 tablet 11  . Insulin Detemir (LEVEMIR FLEXTOUCH) 100 UNIT/ML Pen Inject 35 Units into the skin at bedtime.    . lidocaine-prilocaine (EMLA) cream Apply 1 application topically 3 (three) times a week. Apply 1 application onto the left arm as directed    . metoprolol tartrate (LOPRESSOR) 25 MG tablet Take 0.5 tablets (12.5 mg total) by mouth 2 (two) times daily. 60 tablet 6  . nitroGLYCERIN (NITROSTAT) 0.4 MG SL tablet Place 0.4 mg under the tongue every 5 (five) minutes as needed for chest pain.     [name] RENVELA 800 MG tablet Take 3 tablets by mouth 3 (three) times daily before meals.     No current facility-administered medications for this visit.     Allergies:   Byetta 10 mcg pen [exenatide]   Social History:  The patient  reports that he quit smoking about 32 years ago. He has a 64.00 pack-year smoking history. He has never used smokeless tobacco. He reports that he drinks alcohol. He reports that he does not use drugs.   Family History:  The patient's family history includes Diabetes in his father and sister; Heart disease in his father.   ROS:  Please see the history of present illness.  Otherwise, review of systems is positive for easy brusing, erectile dysfunction.  All other systems are reviewed and negative.   PHYSICAL EXAM: VS:  BP (!) 150/62   Pulse 73   Ht 5' 11.5"  (1.816 m)   Wt 106.1 kg (234 lb)   SpO2 96%   BMI 32.18 kg/m  , BMI Body mass index is 32.18 kg/m. GEN: Well nourished, well developed, in no acute distress  HEENT: normal  Neck: no JVD, no masses. bilateral carotid bruits Cardiac: RRR with 2/6 systolic murmur at the RUSB              Respiratory:  clear to auscultation bilaterally, normal work of breathing GI: soft, nontender, nondistended, + BS MS: no deformity or atrophy  Ext: no pretibial edema Skin: warm and dry, no rash Neuro:  Strength and sensation are intact Psych: euthymic mood, full affect  EKG:  EKG is not ordered today.  Recent Labs: No results found for requested labs within last 8760 hours.   Lipid Panel     Component Value Date/Time   CHOL 100 04/28/2015 0703  TRIG 121 04/28/2015 0703   HDL 28 (L) 04/28/2015 0703   CHOLHDL 3.6 04/28/2015 0703   VLDL 24 04/28/2015 0703   LDLCALC 48 04/28/2015 0703      Wt Readings from Last 3 Encounters:  08/07/16 106.1 kg (234 lb)  07/26/16 105.3 kg (232 lb 1.9 oz)  02/02/16 102.1 kg (225 lb)     Cardiac Studies Reviewed: 2D Echo - 07/19/2016: Study Conclusions  - Left ventricle: The cavity size was normal. Wall thickness was   increased in a pattern of moderate LVH. Septal-lateral   dyssynchroncy consistent with RV pacing. Systolic function was   mildly reduced. The estimated ejection fraction was in the range   of 45% to 50%. Diffuse hypokinesis. Doppler parameters are   consistent with abnormal left ventricular relaxation (grade 1   diastolic dysfunction). 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 Trivial peri-valvular regurgitation. No   significant bioprosthetic valvular stenosis. Mean gradient (S):   12 mm Hg. - Aorta: Mildly dilated aortic root and ascending aorta. Aortic   root dimension: 39 mm (ED). Ascending aortic diameter: 38 mm (S). - Mitral valve: Mildly calcified annulus. There was trivial    regurgitation. - Left atrium: The atrium was moderately dilated. - Right ventricle: The cavity size was normal. Pacer wire or   catheter noted in right ventricle. Systolic function was normal. - Right atrium: The atrium was mildly dilated. - Tricuspid valve: Peak RV-RA gradient (S): 17 mm Hg. - Pulmonary arteries: PA peak pressure: 25 mm Hg (S). - Systemic veins: IVC measured 2.0 cm with < 50% respirophasic   variation, suggesting RA pressure 8 mmHg.  Impressions:  - Normal LV size with moderate LV hypertrophy. EF 45-50% with   diffuse hypokinesis and septal-lateral dyssynchrony likely due to   pacing. Normal RV size and systolic function. Bioprosthetic   aortic valve s/p TAVR, trivial perivalvular regurgitation.  ASSESSMENT AND PLAN: 1.  Aortic stenosis now one year out from TAVR: NYHA I sx's. Doing well on current medical therapy. Recent echo reviewed and shows normal function of his transcatheter heart valve with normal gradients and trivial paravalvular leak. SBE prophylaxis guidelines reviewed with the patient. Will see back as needed.  2. ESRD: stable. Reports that he tolerates dialysis well.  3. Erectile dysfunction: no cardiac contraindication to use of Viagra. Discussed the contraindication with NTG - he has not used this in over 6 months.   4. PPM: followed by Dr Caryl Comes  Current medicines are reviewed with the patient today.  The patient does not have concerns regarding medicines.  Labs/ tests ordered today include:  No orders of the defined types were placed in this encounter.   Disposition:   FU Dr Caryl Comes as scheduled  Signed, [name], MD  08/07/2016 4:03 PM    [facility] [address], [place], [place]  [address] Phone: [phone]; Fax: [phone]

## 2016-08-07 NOTE — Patient Instructions (Signed)
Medication Instructions:  Your physician recommends that you continue on your current medications as directed. Please refer to the Current Medication list given to you today.  Labwork: No new orders.   Testing/Procedures: No new orders.   Follow-Up: Your physician recommends that you schedule a follow-up appointment as needed.    Any Other Special Instructions Will Be Listed Below (If Applicable).     If you need a refill on your cardiac medications before your next appointment, please call your pharmacy.

## 2016-10-15 ENCOUNTER — Ambulatory Visit: Payer: Medicare Other | Admitting: Cardiovascular Disease

## 2016-10-25 ENCOUNTER — Ambulatory Visit (INDEPENDENT_AMBULATORY_CARE_PROVIDER_SITE_OTHER): Payer: Medicare Other | Admitting: *Deleted

## 2016-10-25 DIAGNOSIS — I442 Atrioventricular block, complete: Secondary | ICD-10-CM | POA: Diagnosis not present

## 2016-10-25 NOTE — Progress Notes (Signed)
Remote pacemaker transmission.   

## 2016-11-06 ENCOUNTER — Encounter: Payer: Self-pay | Admitting: Cardiology

## 2016-11-28 ENCOUNTER — Telehealth: Payer: Self-pay | Admitting: Internal Medicine

## 2016-11-28 NOTE — Telephone Encounter (Signed)
°  New Prob   Requesting documentation stating that pt can go without wearing a seatbelt while driving as the seatbelt pushes down on his pacemaker and causes him discomfort. Please call.

## 2016-11-28 NOTE — Telephone Encounter (Signed)
I called and notified Jeffrey [name] that we are unable to supply a note that patient cannot wear his seatbelt due to his device.  She voices understanding.

## 2016-11-29 LAB — CUP PACEART REMOTE DEVICE CHECK
Brady Statistic RV Percent Paced: 96 %
Date Time Interrogation Session: 20180104094850
Implantable Lead Implant Date: 20160815
Implantable Pulse Generator Implant Date: 20160815
Lead Channel Impedance Value: 429 Ohm
Lead Channel Impedance Value: 429 Ohm
Lead Channel Pacing Threshold Pulse Width: 0.4 ms
MDC IDC LEAD IMPLANT DT: 20160815
MDC IDC LEAD LOCATION: 753859
MDC IDC LEAD LOCATION: 753860
MDC IDC MSMT LEADCHNL RA PACING THRESHOLD AMPLITUDE: 1 V
MDC IDC MSMT LEADCHNL RA PACING THRESHOLD PULSEWIDTH: 0.4 ms
MDC IDC MSMT LEADCHNL RA SENSING INTR AMPL: 2.8 mV
MDC IDC MSMT LEADCHNL RV PACING THRESHOLD AMPLITUDE: 0.8 V
MDC IDC MSMT LEADCHNL RV SENSING INTR AMPL: 14 mV
MDC IDC PG SERIAL: 68600861
MDC IDC STAT BRADY RA PERCENT PACED: 42 %

## 2017-01-23 ENCOUNTER — Emergency Department (HOSPITAL_COMMUNITY)
Admission: EM | Admit: 2017-01-23 | Discharge: 2017-01-23 | Disposition: A | Discharge status: Home / Self Care | Payer: Medicare Other | Attending: Emergency Medicine | Admitting: Emergency Medicine

## 2017-01-23 ENCOUNTER — Encounter (HOSPITAL_COMMUNITY): Payer: Self-pay | Admitting: *Deleted

## 2017-01-23 ENCOUNTER — Emergency Department (HOSPITAL_COMMUNITY): Payer: Medicare Other

## 2017-01-23 DIAGNOSIS — J069 Acute upper respiratory infection, unspecified: Secondary | ICD-10-CM | POA: Diagnosis not present

## 2017-01-23 DIAGNOSIS — R112 Nausea with vomiting, unspecified: Secondary | ICD-10-CM | POA: Insufficient documentation

## 2017-01-23 DIAGNOSIS — Z794 Long term (current) use of insulin: Secondary | ICD-10-CM | POA: Insufficient documentation

## 2017-01-23 DIAGNOSIS — R197 Diarrhea, unspecified: Secondary | ICD-10-CM | POA: Diagnosis not present

## 2017-01-23 DIAGNOSIS — Z8673 Personal history of transient ischemic attack (TIA), and cerebral infarction without residual deficits: Secondary | ICD-10-CM | POA: Diagnosis not present

## 2017-01-23 DIAGNOSIS — I5033 Acute on chronic diastolic (congestive) heart failure: Secondary | ICD-10-CM | POA: Diagnosis not present

## 2017-01-23 DIAGNOSIS — R05 Cough: Secondary | ICD-10-CM | POA: Diagnosis present

## 2017-01-23 DIAGNOSIS — Z992 Dependence on renal dialysis: Secondary | ICD-10-CM | POA: Insufficient documentation

## 2017-01-23 DIAGNOSIS — Z87891 Personal history of nicotine dependence: Secondary | ICD-10-CM | POA: Insufficient documentation

## 2017-01-23 DIAGNOSIS — I132 Hypertensive heart and chronic kidney disease with heart failure and with stage 5 chronic kidney disease, or end stage renal disease: Secondary | ICD-10-CM | POA: Diagnosis not present

## 2017-01-23 DIAGNOSIS — I252 Old myocardial infarction: Secondary | ICD-10-CM | POA: Insufficient documentation

## 2017-01-23 DIAGNOSIS — Z79899 Other long term (current) drug therapy: Secondary | ICD-10-CM | POA: Insufficient documentation

## 2017-01-23 DIAGNOSIS — N186 End stage renal disease: Secondary | ICD-10-CM | POA: Insufficient documentation

## 2017-01-23 DIAGNOSIS — Z7982 Long term (current) use of aspirin: Secondary | ICD-10-CM | POA: Insufficient documentation

## 2017-01-23 DIAGNOSIS — I2510 Atherosclerotic heart disease of native coronary artery without angina pectoris: Secondary | ICD-10-CM | POA: Insufficient documentation

## 2017-01-23 DIAGNOSIS — E860 Dehydration: Secondary | ICD-10-CM

## 2017-01-23 LAB — CBC WITH DIFFERENTIAL/PLATELET
Basophils Absolute: 0 10*3/uL (ref 0.0–0.1)
Basophils Relative: 0 %
EOS PCT: 4 %
Eosinophils Absolute: 0.4 10*3/uL (ref 0.0–0.7)
HCT: 32.7 % — ABNORMAL LOW (ref 39.0–52.0)
Hemoglobin: 11.4 g/dL — ABNORMAL LOW (ref 13.0–17.0)
LYMPHS ABS: 2.3 10*3/uL (ref 0.7–4.0)
LYMPHS PCT: 24 %
MCH: 31.8 pg (ref 26.0–34.0)
MCHC: 34.9 g/dL (ref 30.0–36.0)
MCV: 91.1 fL (ref 78.0–100.0)
MONO ABS: 0.7 10*3/uL (ref 0.1–1.0)
Monocytes Relative: 8 %
Neutro Abs: 6.3 10*3/uL (ref 1.7–7.7)
Neutrophils Relative %: 64 %
PLATELETS: 215 10*3/uL (ref 150–400)
RBC: 3.59 MIL/uL — AB (ref 4.22–5.81)
RDW: 12.6 % (ref 11.5–15.5)
WBC: 9.7 10*3/uL (ref 4.0–10.5)

## 2017-01-23 LAB — I-STAT CG4 LACTIC ACID, ED
LACTIC ACID, VENOUS: 2.23 mmol/L — AB (ref 0.5–1.9)
LACTIC ACID, VENOUS: 1.92 mmol/L — AB (ref 0.5–1.9)
Lactic Acid, Venous: 2.2 mmol/L (ref 0.5–1.9)

## 2017-01-23 LAB — I-STAT TROPONIN, ED: TROPONIN I, POC: 0.04 ng/mL (ref 0.00–0.08)

## 2017-01-23 LAB — COMPREHENSIVE METABOLIC PANEL
ALT: 10 U/L — AB (ref 17–63)
AST: 19 U/L (ref 15–41)
Albumin: 3.7 g/dL (ref 3.5–5.0)
Alkaline Phosphatase: 85 U/L (ref 38–126)
Anion gap: 16 — ABNORMAL HIGH (ref 5–15)
BILIRUBIN TOTAL: 0.9 mg/dL (ref 0.3–1.2)
BUN: 68 mg/dL — ABNORMAL HIGH (ref 6–20)
CHLORIDE: 95 mmol/L — AB (ref 101–111)
CO2: 25 mmol/L (ref 22–32)
CREATININE: 6.48 mg/dL — AB (ref 0.61–1.24)
Calcium: 9 mg/dL (ref 8.9–10.3)
GFR, EST AFRICAN AMERICAN: 8 mL/min — AB (ref >60)
GFR, EST NON AFRICAN AMERICAN: 7 mL/min — AB (ref >60)
Glucose, Bld: 205 mg/dL — ABNORMAL HIGH (ref 65–99)
Potassium: 4.4 mmol/L (ref 3.5–5.1)
Sodium: 136 mmol/L (ref 135–145)
Total Protein: 7.2 g/dL (ref 6.5–8.1)

## 2017-01-23 MED ORDER — ONDANSETRON 4 MG PO TBDP: 4.0000 mg | ORAL_TABLET | Freq: Three times a day (TID) | ORAL | 0 refills | Status: DC | PRN

## 2017-01-23 MED ORDER — BENZONATATE 100 MG PO CAPS: 100.0000 mg | ORAL_CAPSULE | Freq: Two times a day (BID) | ORAL | Status: DC | PRN

## 2017-01-23 MED ORDER — SODIUM CHLORIDE 0.9 % IV BOLUS (SEPSIS)
500.0000 mL | Freq: Once | INTRAVENOUS | Status: AC
  Administered 2017-01-23: 500 mL via INTRAVENOUS

## 2017-01-23 MED ORDER — BENZONATATE 100 MG PO CAPS: 100.0000 mg | ORAL_CAPSULE | Freq: Two times a day (BID) | ORAL | 0 refills | Status: DC | PRN

## 2017-01-23 NOTE — ED Provider Notes (Signed)
[facility] DEPT Provider Note   CSN: [id number] Arrival date & time: 01/23/17  1558     History   Chief Complaint Chief Complaint  Patient presents with  . URI    PCP sent for eval of possible pneumonia    HPI Jeffrey [name] is a 76 y.o. male.  HPI  1 week of coughing, productive of "green goo", associated with shortness of breath, with coughing has chest pain, reports after episodes of coughing pain worse and lasts 10 minutes. Takes aspirin when has it. Went to [facility] today and recommended coming to ED for evaluation Missed dialysis this AM because feeling too sick, last was Monday Vomited this AM, half a small trash can,  Has been vomiting every day. Not eating much because throwing up.  No diarrhea Runny nose. No sore throat.  Subjective fevers at home the other Feeling ok today except coughing/choking.    Past Medical History:  Diagnosis Date  . Anginal pain ([place])   . Aortic stenosis    a. severe by echo 09/2014  . Atrial fibrillation ([place])    a. not well documented, not on anticoagulation  . CHF (congestive heart failure) ([place])   . Claustrophobia   . Complete heart block ([place])   . Coronary artery disease    a. chronically occluded RCA per cath 09/2014 with collaterals  . CVA (cerebral vascular accident) ([place]) 10/2014   denies residual on 07/11/2015  . ESRD (end stage renal disease) on dialysis [facility]    a. on dialysis; [facility]; MWF, LUE fistula (07/11/2015)  . History of blood transfusion    "related to gallbladder OR"  . History of stomach ulcers   . Hyperlipidemia   . Hypertension   . Iron deficiency anemia   . Myocardial infarction 10/2014  . Peripheral vascular disease ([place])   . Pneumonia   . Presence of permanent cardiac pacemaker   . Renal insufficiency   . 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 Type II  diabetes mellitus [facility])     Patient Active Problem List   Diagnosis Date Noted  . Non-healing open wound of left groin 09/05/2015  . Automatic implantable cardioverter-defibrillator in situ 08/08/2015  . 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 Hyperkalemia 07/27/2015  . Aortic stenosis   . Hypervolemia   . Severe aortic stenosis   . Acute on chronic diastolic CHF (congestive heart failure) ([place]) 07/19/2015  . Fluid overload 07/18/2015  . Acute respiratory failure ([place]) 07/18/2015  . ESRD on hemodialysis ([place]) 07/18/2015  . Hypoxia   . Type 2 diabetes mellitus with complication ([place])   . Cardiac device in situ, other   . 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/2015  . Chest pain   . Atherosclerosis of native coronary artery of native heart with angina pectoris ([place])   . Type 2 diabetes mellitus with other circulatory complications   . End stage renal disease ([place]) 03/08/2015  . Protein-calorie malnutrition, severe ([place]) 01/14/2015  . ESRD (end stage renal disease) on dialysis ([place]) 01/12/2015  . Aortic valve stenosis, moderate 01/11/2015  . Mobitz type 1 second degree AV block   . Elevated troponin 09/30/2014  . History of stroke 09/29/2014  . DM (diabetes mellitus), type 2 ([place]) 09/29/2014  . HLD (hyperlipidemia) 09/29/2014  . CAD (coronary artery disease) 09/29/2014  . Atherosclerosis of coronary artery 09/29/2014  .  Essential hypertension   . History of tobacco use 11/21/2012  . Breath shortness 11/11/2012  . Hyperlipidemia 01/19/2010  . Hypertension 01/19/2010  . Arthritis, degenerative 01/19/2010  . Allergic rhinitis 12/05/2009  . Diabetes mellitus ([place]) 12/05/2009  . Idiopathic peripheral neuropathy 01/05/2009  . 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  . Absolute anemia 11/13/2007    Past Surgical History:  Procedure Laterality Date  . AV FISTULA PLACEMENT Left 10/19/2014   Procedure: BRACHIOCEPHALIC  ARTERIOVENOUS (AV) FISTULA CREATION ;  Surgeon: Conrad Dodson, MD;  Location: [facility];  Service: Vascular;  Laterality: Left;  . CARDIAC CATHETERIZATION    . CARDIAC CATHETERIZATION N/A 07/22/2015   Procedure: Right/Left Heart Cath and Coronary Angiography;  Surgeon: [name], MD;  Location: [facility];  Service: Cardiovascular;  Laterality: N/A;  . CATARACT EXTRACTION W/ INTRAOCULAR LENS  IMPLANT, BILATERAL Bilateral 1990's  . CHOLECYSTECTOMY OPEN  1980's  . COLONOSCOPY W/ BIOPSIES AND POLYPECTOMY    . CORONARY ANGIOPLASTY    . EP IMPLANTABLE DEVICE N/A 07/11/2015   Procedure: Pacemaker Implant;  Surgeon: [name], MD;  Location: [facility];  Service: Cardiovascular;  Laterality: N/A;  . ESOPHAGOGASTRODUODENOSCOPY  08/01/2012   Procedure: ESOPHAGOGASTRODUODENOSCOPY (EGD);  Surgeon: [name], MD;  Location: [facility];  Service: Endoscopy;  Laterality: N/A;  . INSERT / REPLACE / REMOVE PACEMAKER  07/11/2015  . INSERTION OF DIALYSIS CATHETER Right 02/02/2015   Procedure: INSERTION OF DIALYSIS CATHETER  RIGHT INTERNAL JUGULAR;  Surgeon: Mal [name], MD;  Location: [facility];  Service: Vascular;  Laterality: Right;  . LEFT AND RIGHT HEART CATHETERIZATION WITH CORONARY ANGIOGRAM N/A 09/30/2014   Procedure: LEFT AND RIGHT HEART CATHETERIZATION WITH CORONARY ANGIOGRAM;  Surgeon: [name], MD;  Location: [facility] CATH LAB;  Service: Cardiovascular;  Laterality: N/A;  . TEE WITHOUT CARDIOVERSION N/A 08/02/2015   Procedure: TRANSESOPHAGEAL ECHOCARDIOGRAM (TEE);  Surgeon: [name], MD;  Location: [facility];  Service: Open Heart Surgery;  Laterality: N/A;  . TONSILLECTOMY    . TRANSCATHETER AORTIC VALVE REPLACEMENT, TRANSFEMORAL Left 08/02/2015   Procedure: TRANSCATHETER AORTIC VALVE REPLACEMENT, TRANSFEMORAL;  Surgeon: [name], MD;  Location: [facility];  Service: Open Heart Surgery;  Laterality: Left;       Home Medications    Prior to Admission medications     Medication Sig Start Date End Date Taking? Authorizing Provider  amLODipine (NORVASC) 10 MG tablet Take 10 mg by mouth daily.    Yes Historical Provider, MD  aspirin EC 325 MG tablet Take 1 tablet (325 mg total) by mouth daily. 09/05/15  Yes [name], MD  atorvastatin (LIPITOR) 40 MG tablet Take 1 tablet (40 mg total) by mouth daily at 6 PM. 08/05/15  Yes Bhavinkumar Bhagat, PA  calcium carbonate (TUMS - DOSED IN MG ELEMENTAL CALCIUM) 500 MG chewable tablet Chew 1 tablet by mouth 4 (four) times daily as needed for indigestion or heartburn.   Yes Historical Provider, MD  cinacalcet (SENSIPAR) 30 MG tablet Take 30 mg by mouth daily with supper.   Yes Historical Provider, MD  Insulin Detemir (LEVEMIR FLEXTOUCH) 100 UNIT/ML Pen Inject 38 Units into the skin at bedtime.    Yes Historical Provider, MD  lidocaine-prilocaine (EMLA) cream Apply 1 application topically once as needed (prior to accessing port).    Yes Historical Provider, MD  metoprolol tartrate (LOPRESSOR) 25 MG tablet Take 0.5 tablets (12.5 mg total) by mouth 2 (two) times daily. 08/05/15  Yes Leanor Kail,  PA  nitroGLYCERIN (NITROSTAT) 0.4 MG SL tablet Place 0.4 mg under the tongue every 5 (five) minutes as needed for chest pain.    Yes Historical Provider, MD  ondansetron (ZOFRAN) 8 MG tablet Take 8 mg by mouth daily as needed for nausea or vomiting.   Yes Historical Provider, MD  ranitidine (ZANTAC) 150 MG tablet Take 150 mg by mouth 2 (two) times daily as needed for heartburn.   Yes Historical Provider, MD  sevelamer carbonate (RENVELA) 800 MG tablet Take 2,400 mg by mouth 3 (three) times daily with meals.   Yes Historical Provider, MD  benzonatate (TESSALON) 100 MG capsule Take 1 capsule (100 mg total) by mouth 2 (two) times daily as needed for cough. 01/23/17   Gareth Morgan, MD  ondansetron (ZOFRAN ODT) 4 MG disintegrating tablet Take 1 tablet (4 mg total) by mouth every 8 (eight) hours as needed for nausea or vomiting.  01/23/17   Gareth Morgan, MD    Family History Family History  Problem Relation Age of Onset  . Diabetes Father   . Heart disease Father   . Diabetes Sister     Social History Social History  Substance Use Topics  . Smoking status: Former Smoker    Packs/day: 2.00    Years: 32.00    Quit date: 11/27/1983  . Smokeless tobacco: Never Used  . Alcohol use Yes     Comment: 07/11/2015 "stopped drinking in 09/2014"     Allergies   Byetta 10 mcg pen [exenatide] and Codeine   Review of Systems Review of Systems  Constitutional: Negative for fever (not today but subjective days ago).  HENT: Positive for congestion. Negative for sore throat.   Eyes: Negative for visual disturbance.  Respiratory: Positive for cough, shortness of breath and wheezing.   Cardiovascular: Positive for chest pain.  Gastrointestinal: Positive for nausea and vomiting. Negative for abdominal pain, constipation and diarrhea.  Genitourinary: Negative for difficulty urinating and dysuria.  Musculoskeletal: Positive for back pain. Negative for neck stiffness.  Skin: Negative for rash.  Neurological: Negative for syncope and headaches.     Physical Exam Updated Vital Signs BP 152/55 (BP Location: Right Arm)   Pulse (!) 54   Temp 98.1 F (36.7 C) (Oral)   Resp 19   Ht 5\' 11"  (1.803 m)   Wt 236 lb (107 kg)   SpO2 97%   BMI 32.92 kg/m   Physical Exam  Constitutional: He is oriented to person, place, and time. He appears well-developed and well-nourished. No distress.  HENT:  Head: Normocephalic and atraumatic.  Eyes: Conjunctivae and EOM are normal.  Neck: Normal range of motion.  Cardiovascular: Normal rate, regular rhythm, normal heart sounds and intact distal pulses.  Exam reveals no gallop and no friction rub.   No murmur heard. Pulmonary/Chest: Effort normal. No respiratory distress. He has wheezes (occasional however clearing). He has no rales.  Abdominal: Soft. He exhibits no distension.  There is no tenderness. There is no guarding.  Musculoskeletal: He exhibits no edema.  Left fistula with thrill and no erythema  Neurological: He is alert and oriented to person, place, and time.  Skin: Skin is warm and dry. He is not diaphoretic.  Nursing note and vitals reviewed.    ED Treatments / Results  Labs (all labs ordered are listed, but only abnormal results are displayed) Labs Reviewed  CBC WITH DIFFERENTIAL/PLATELET - Abnormal; Notable for the following:       Result Value   RBC 3.59 (*)  Hemoglobin 11.4 (*)    HCT 32.7 (*)    All other components within normal limits  COMPREHENSIVE METABOLIC PANEL - Abnormal; Notable for the following:    Chloride 95 (*)    Glucose, Bld 205 (*)    BUN 68 (*)    Creatinine, Ser 6.48 (*)    ALT 10 (*)    GFR calc non Af Amer 7 (*)    GFR calc Af Amer 8 (*)    Anion gap 16 (*)    All other components within normal limits  I-STAT CG4 LACTIC ACID, ED - Abnormal; Notable for the following:    Lactic Acid, Venous 2.23 (*)    All other components within normal limits  I-STAT CG4 LACTIC ACID, ED - Abnormal; Notable for the following:    Lactic Acid, Venous 2.20 (*)    All other components within normal limits  I-STAT CG4 LACTIC ACID, ED - Abnormal; Notable for the following:    Lactic Acid, Venous 1.92 (*)    All other components within normal limits  I-STAT TROPOININ, ED    EKG  EKG Interpretation  Date/Time:  Wednesday January 23 2017 16:35:07 EST Ventricular Rate:  148 PR Interval:    QRS Duration: 203 QT Interval:  397 QTC Calculation: 736 R Axis:   100 Text Interpretation:  Ventricular-paced complexes Baseline wander in lead(s) V3 No significant change since last tracing Confirmed by [facility] MD, Ennifer Harston ([id number]) on 01/23/2017 6:01:21 PM       Radiology Dg Chest 2 View  Result Date: 01/23/2017 CLINICAL DATA:  Shortness of breath, cough, emesis and nausea. EXAM: CHEST  2 VIEW COMPARISON:  08/22/2015 and CT chest  07/21/2015. FINDINGS: Trachea is midline. Heart is enlarged, stable. Thoracic aorta is calcified. Right subclavian pacemaker lead tips are in the right atrium and right ventricle. Aortic valve replacement. Mild right hilar prominence, unchanged. Minimal bibasilar atelectasis. No pleural fluid. IMPRESSION: 1. Minimal bibasilar atelectasis. 2.  Aortic atherosclerosis (ICD10-170.0). Electronically Signed   By: Lorin Picket M.D.   On: 01/23/2017 16:58    Procedures Procedures (including critical care time)  Medications Ordered in ED Medications  sodium chloride 0.9 % bolus 500 mL (0 mLs Intravenous Stopped 01/23/17 2042)     Initial Impression / Assessment and Plan / ED Course  I have reviewed the triage vital signs and the nursing notes.  Pertinent labs & imaging results that were available during my care of the patient were reviewed by me and considered in my medical decision making (see chart for details).     76 year old male with a history of ESRD on dialysis Monday Wednesday Friday, aortic stenosis status post T aVR, diabetes, CVA, complete heart block with pacemaker in place, coronary artery disease, atrial fibrillation who presents with concern for cough, nasal congestion, chest pain with coughing.  Lactic acid mildly elevated at 2.23. Chest x-ray shows no sign of pneumonia. Setting of no pneumonia, no leukocytosis, normal vital signs, have low suspicion that lactic acid elevation represents sepsis. Suspect this is likely secondary to dehydration in setting of patient not eating or drinking as much over the last few days, and vomiting this morning.  Patient reports chest pain with coughing, with concern that he has a cardiac history. EKG shows a ventricularly paced rhythm, troponin evaluated and negative, and have low suspicion for ACS. Suspect this is likely musculoskeletal pain from coughing. Low suspicion for pulmonary embolus given clinical history, no hypoxia, no tachypnea, no  tachycardia and feel more  likely pain from URI/msk pain.  Patient missed dialysis and labs obtained showing no acute changes.  Initially attempted to po hydrate patient give dialysis hx and repeat mildly elevated lactic acid without significant change. Given 500cc NS with improvement of lactic acid to 1.92.  Suspect symptoms secondary to viral URI and lactate secondary to dehydration. Doubt influenza and given duration of symptoms for 1 week do not feel tamilfu is indicated.    Gave rx for zofran and tessalon pearls. Patient discharged in stable condition with understanding of reasons to return.     Final Clinical Impressions(s) / ED Diagnoses   Final diagnoses:  Upper respiratory tract infection, unspecified type  Dehydration  Nausea and vomiting, intractability of vomiting not specified, unspecified vomiting type    New Prescriptions Discharge Medication List as of 01/23/2017 10:13 PM    START taking these medications   Details  ondansetron (ZOFRAN ODT) 4 MG disintegrating tablet Take 1 tablet (4 mg total) by mouth every 8 (eight) hours as needed for nausea or vomiting., Starting Wed 01/23/2017, Print         Gareth Morgan, MD 01/24/17 [phone]

## 2017-01-23 NOTE — ED Notes (Signed)
Notified EDP,Schlosman,MD., pt. I-stat CG4 Lactic acid results 2.20. RN,Spencer made aware.

## 2017-01-23 NOTE — ED Notes (Signed)
Patient transported to X-ray 

## 2017-01-23 NOTE — ED Triage Notes (Signed)
Pt was sent by his PCP for concerns of pneumonia.  Pt missed his HD treatment today due to being sick (normally M/W/F schedule). Pt is alert and oriented.  Has has a URI with productive cough (phlegm, green and brown), pt states that he has coughed so much that he gets sob and at times it makes him vomit.  No fever or chills with this.

## 2017-01-24 ENCOUNTER — Ambulatory Visit (INDEPENDENT_AMBULATORY_CARE_PROVIDER_SITE_OTHER): Payer: Medicare Other | Admitting: *Deleted

## 2017-01-24 DIAGNOSIS — I442 Atrioventricular block, complete: Secondary | ICD-10-CM | POA: Diagnosis not present

## 2017-01-24 NOTE — Progress Notes (Signed)
Remote pacemaker transmission.   

## 2017-01-29 ENCOUNTER — Encounter: Payer: Self-pay | Admitting: Cardiology

## 2017-01-29 LAB — CUP PACEART REMOTE DEVICE CHECK
Brady Statistic RA Percent Paced: 43 %
Brady Statistic RV Percent Paced: 96 %
Date Time Interrogation Session: 20180306140446
Implantable Lead Implant Date: 20160815
Implantable Lead Location: 753859
Implantable Lead Location: 753860
Lead Channel Pacing Threshold Amplitude: 1.1 V
Lead Channel Pacing Threshold Pulse Width: 0.4 ms
Lead Channel Sensing Intrinsic Amplitude: 3.1 mV
MDC IDC LEAD IMPLANT DT: 20160815
MDC IDC MSMT LEADCHNL RA IMPEDANCE VALUE: 429 Ohm
MDC IDC MSMT LEADCHNL RV IMPEDANCE VALUE: 410 Ohm
MDC IDC MSMT LEADCHNL RV PACING THRESHOLD AMPLITUDE: 0.7 V
MDC IDC MSMT LEADCHNL RV PACING THRESHOLD PULSEWIDTH: 0.4 ms
MDC IDC MSMT LEADCHNL RV SENSING INTR AMPL: 13.3 mV
MDC IDC PG IMPLANT DT: 20160815
Pulse Gen Model: 394929
Pulse Gen Serial Number: 68600861

## 2017-01-30 ENCOUNTER — Telehealth: Payer: Self-pay | Admitting: Cardiovascular Disease

## 2017-01-30 NOTE — Telephone Encounter (Signed)
Reviewed chart and the pt's primary cardiologist has been Dr Caryl Comes (Dr Burt Knack performed TAVR and planned PRN follow-up).  The pt is undergoing kidney transplant and he needs cardiac clearance to proceed (notes in Care Everywhere).  I spoke with Nira Conn RN and scheduled the pt to see Dr Caryl Comes tomorrow.

## 2017-01-30 NOTE — Telephone Encounter (Signed)
Pt's wife called pt needs an appt sooner.

## 2017-01-31 ENCOUNTER — Ambulatory Visit (INDEPENDENT_AMBULATORY_CARE_PROVIDER_SITE_OTHER): Payer: Medicare Other | Admitting: Internal Medicine

## 2017-01-31 ENCOUNTER — Encounter: Payer: Self-pay | Admitting: Internal Medicine

## 2017-01-31 VITALS — BP 158/50 | HR 61 | Ht 71.0 in | Wt 239.2 lb

## 2017-01-31 DIAGNOSIS — Z950 Presence of cardiac pacemaker: Secondary | ICD-10-CM

## 2017-01-31 DIAGNOSIS — I480 Paroxysmal atrial fibrillation: Secondary | ICD-10-CM | POA: Diagnosis not present

## 2017-01-31 DIAGNOSIS — I442 Atrioventricular block, complete: Secondary | ICD-10-CM | POA: Diagnosis not present

## 2017-01-31 DIAGNOSIS — R001 Bradycardia, unspecified: Secondary | ICD-10-CM | POA: Diagnosis not present

## 2017-01-31 DIAGNOSIS — Z952 Presence of prosthetic heart valve: Secondary | ICD-10-CM

## 2017-01-31 LAB — CUP PACEART INCLINIC DEVICE CHECK
Battery Remaining Longevity: 99 mo
Brady Statistic RA Percent Paced: 38 %
Date Time Interrogation Session: 20180308171524
Implantable Lead Implant Date: 20160815
Implantable Lead Location: 753859
Implantable Lead Location: 753860
Implantable Lead Model: 5076
Implantable Lead Model: 5076
Lead Channel Impedance Value: 409 Ohm
Lead Channel Pacing Threshold Amplitude: 0.8 V
Lead Channel Pacing Threshold Pulse Width: 0.4 ms
Lead Channel Setting Pacing Amplitude: 2 V
Lead Channel Setting Pacing Pulse Width: 0.4 ms
MDC IDC LEAD IMPLANT DT: 20160815
MDC IDC MSMT LEADCHNL RA SENSING INTR AMPL: 4.5 mV
MDC IDC MSMT LEADCHNL RV IMPEDANCE VALUE: 448 Ohm
MDC IDC MSMT LEADCHNL RV SENSING INTR AMPL: 10.9 mV
MDC IDC PG IMPLANT DT: 20160815
MDC IDC PG SERIAL: 68600861
MDC IDC SET LEADCHNL RV PACING AMPLITUDE: 2 V
MDC IDC STAT BRADY RV PERCENT PACED: 97 %
Pulse Gen Model: 394929

## 2017-01-31 NOTE — Patient Instructions (Signed)
Medication Instructions: - Your physician recommends that you continue on your current medications as directed. Please refer to the Current Medication list given to you today.  ** we are waiting on a call back from Dr. Lorrene Reid regarding a blood thinner for you **   Labwork: - none ordered  Procedures/Testing: - none ordered  Follow-Up: - Remote monitoring is used to monitor your Pacemaker of ICD from home. This monitoring reduces the number of office visits required to check your device to one time per year. It allows [place] to keep an eye on the functioning of your device to ensure it is working properly. You are scheduled for a device check from home on 05/02/17. You may send your transmission at any time that day. If you have a wireless device, the transmission will be sent automatically. After your physician reviews your transmission, you will receive a postcard with your next transmission date.  - Your physician wants you to follow-up in: 1 year with Dr. Caryl Comes. You will receive a reminder letter in the mail two months in advance. If you don't receive a letter, please call our office to schedule the follow-up appointment.  Any Additional Special Instructions Will Be Listed Below (If Applicable).     If you need a refill on your cardiac medications before your next appointment, please call your pharmacy.

## 2017-01-31 NOTE — Progress Notes (Signed)
Patient Care Team: Thurman Coyer, MD as PCP - General (Internal Medicine) Deboraha Sprang, MD as PCP - Cardiology (Cardiology) Fleet Contras, MD as Consulting Physician (Nephrology)   HPI  Jeffrey [name] is a 76 y.o. male Seen for cardiac clearance for potential kidney transplant.  He has known coronary disease..>>>"with coronary calcification and total occlusion of the proximal to mid RCA with extensive left-to-right collaterals; 50 - 60% stenosis in the proximal LAD after the first diagonal branch, and 30% mid AV groove circumflex stenoses.  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    He has moderate aortic stenosis in the context of end-stage renal disease and poorly controlled blood pressure.  He had been having bradycardia associated chest pain during dialysis. The latter resolves with normalization of heart rate   He has seen  Dr. Billee Cashing for his aortic stenosis; measures severity was felt to be exaggerated because of bradycardia. Subsequently  he underwent TAVR. 9/1  This was associated with significant interval improvement. He received a sapien 3 and Which was complicated by CHB   He remains functionally improved. He can climb stairs. He helps install cabinets.  He has noted no change in his functional status in the last week or 2 (see below)    LVEF post TAVR 55-60% 8/17 Echo   ATrial fibrillation noted on device--up to 14 hrs  Not withstanding all of that, he has done incredibly well. He is walking now with just a cane. He denies significant shortness of breath. His issues with dialysis are also much less problematic   Catheterization 11/15 demonstrated an occluded right coronary artery with left-right collaterals.  Past Medical History:  Diagnosis Date  . Anginal pain ([place])   . Aortic stenosis    a. severe by echo 09/2014  . Atrial fibrillation ([place])    a. not well  documented, not on anticoagulation  . CHF (congestive heart failure) ([place])   . Claustrophobia   . Complete heart block ([place])   . Coronary artery disease    a. chronically occluded RCA per cath 09/2014 with collaterals  . CVA (cerebral vascular accident) ([place]) 10/2014   denies residual on 07/11/2015  . ESRD (end stage renal disease) on dialysis [facility])    a. on dialysis; [facility]; MWF, LUE fistula (07/11/2015)  . History of blood transfusion    "related to gallbladder OR"  . History of stomach ulcers   . Hyperlipidemia   . Hypertension   . Iron deficiency anemia   . Myocardial infarction 10/2014  . Peripheral vascular disease ([place])   . Pneumonia   . Presence of permanent cardiac pacemaker   . Renal insufficiency   . 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 Type II diabetes mellitus ([place])     Past Surgical History:  Procedure Laterality Date  . AV FISTULA PLACEMENT Left 10/19/2014   Procedure: BRACHIOCEPHALIC ARTERIOVENOUS (AV) FISTULA CREATION ;  Surgeon: Conrad Sully, MD;  Location: [place];  Service: Vascular;  Laterality: Left;  . CARDIAC CATHETERIZATION    . CARDIAC CATHETERIZATION N/A 07/22/2015   Procedure: Right/Left Heart Cath and Coronary Angiography;  Surgeon: [name], MD;  Location: [place] CV LAB;  Service: Cardiovascular;  Laterality: N/A;  . CATARACT EXTRACTION W/ INTRAOCULAR LENS  IMPLANT, BILATERAL  Bilateral 1990's  . CHOLECYSTECTOMY OPEN  1980's  . COLONOSCOPY W/ BIOPSIES AND POLYPECTOMY    . CORONARY ANGIOPLASTY    . EP IMPLANTABLE DEVICE N/A 07/11/2015   Procedure: Pacemaker Implant;  Surgeon: [name], MD;  Location: [place] CV LAB;  Service: Cardiovascular;  Laterality: N/A;  . ESOPHAGOGASTRODUODENOSCOPY  08/01/2012   Procedure: ESOPHAGOGASTRODUODENOSCOPY (EGD);  Surgeon: [name], MD;  Location: [facility];   Service: Endoscopy;  Laterality: N/A;  . INSERT / REPLACE / REMOVE PACEMAKER  07/11/2015  . INSERTION OF DIALYSIS CATHETER Right 02/02/2015   Procedure: INSERTION OF DIALYSIS CATHETER  RIGHT INTERNAL JUGULAR;  Surgeon: Mal [name], MD;  Location: [place];  Service: Vascular;  Laterality: Right;  . LEFT AND RIGHT HEART CATHETERIZATION WITH CORONARY ANGIOGRAM N/A 09/30/2014   Procedure: LEFT AND RIGHT HEART CATHETERIZATION WITH CORONARY ANGIOGRAM;  Surgeon: [name], MD;  Location: [facility] CATH LAB;  Service: Cardiovascular;  Laterality: N/A;  . TEE WITHOUT CARDIOVERSION N/A 08/02/2015   Procedure: TRANSESOPHAGEAL ECHOCARDIOGRAM (TEE);  Surgeon: [name], MD;  Location: [place];  Service: Open Heart Surgery;  Laterality: N/A;  . TONSILLECTOMY    . TRANSCATHETER AORTIC VALVE REPLACEMENT, TRANSFEMORAL Left 08/02/2015   Procedure: TRANSCATHETER AORTIC VALVE REPLACEMENT, TRANSFEMORAL;  Surgeon: [name], MD;  Location: [place];  Service: Open Heart Surgery;  Laterality: Left;    Current Outpatient Prescriptions  Medication Sig Dispense Refill  . amLODipine (NORVASC) 10 MG tablet Take 10 mg by mouth daily.     [name] aspirin EC 325 MG tablet Take 1 tablet (325 mg total) by mouth daily. 1 tablet 0  . atorvastatin (LIPITOR) 40 MG tablet Take 1 tablet (40 mg total) by mouth daily at 6 PM. 30 tablet 11  . benzonatate (TESSALON) 100 MG capsule Take 1 capsule (100 mg total) by mouth 2 (two) times daily as needed for cough. 21 capsule 0  . calcium carbonate (TUMS - DOSED IN MG ELEMENTAL CALCIUM) 500 MG chewable tablet Chew 1 tablet by mouth 4 (four) times daily as needed for indigestion or heartburn.    . cinacalcet (SENSIPAR) 30 MG tablet Take 30 mg by mouth daily with supper.    . Insulin Detemir (LEVEMIR FLEXTOUCH) 100 UNIT/ML Pen Inject 38 Units into the skin at bedtime.     . lidocaine-prilocaine (EMLA) cream Apply 1 application topically once as needed (prior to accessing port).     . metoprolol  tartrate (LOPRESSOR) 25 MG tablet Take 0.5 tablets (12.5 mg total) by mouth 2 (two) times daily. 60 tablet 6  . nitroGLYCERIN (NITROSTAT) 0.4 MG SL tablet Place 0.4 mg under the tongue every 5 (five) minutes as needed for chest pain.     [name] ondansetron (ZOFRAN ODT) 4 MG disintegrating tablet Take 1 tablet (4 mg total) by mouth every 8 (eight) hours as needed for nausea or vomiting. 20 tablet 0  . ondansetron (ZOFRAN) 8 MG tablet Take 8 mg by mouth daily as needed for nausea or vomiting.    . ranitidine (ZANTAC) 150 MG tablet Take 150 mg by mouth 2 (two) times daily as needed for heartburn.    . sevelamer carbonate (RENVELA) 800 MG tablet Take 2,400 mg by mouth 3 (three) times daily with meals.     No current facility-administered medications for this visit.     Allergies  Allergen Reactions  . Byetta 10 Mcg Pen [Exenatide] Diarrhea and Nausea And Vomiting  . Codeine Itching    Review of Systems negative  except from HPI and PMH  Physical Exam BP (!) 158/50   Pulse 61   Ht 5\' 11"  (1.803 m)   Wt 239 lb 3.2 oz (108.5 kg)   SpO2 98%   BMI 33.36 kg/m  Well developed and well nourished in no acute distress HENT normal E scleral and icterus clear Neck Supple JVP flat; carotids delayed Dialysis shunt in left arm Clear to ausculation Irregular and slow rate and rhythm,  2/6 systolic m Soft with active bowel sounds No clubbing cyanosis Trace Edema Alert and oriented, grossly normal motor and sensory function Skin Warm and Dry  ECG was ordered today demonstrated sinus rhythm  with PVCs synchronous pacing with a PR interval of 360 ms   Assessment and  Plan  Complete heart block    Pacemaker Biotronik  Aortic stenosis s/p TAVR  Ventricular Tachycardia  End-stage renal disease  Atrial fibrillation-flutter  Persistent  Preoperative evaluation.  No intercurrent Ventricular tachycardia   functional status remains quite good. It sounds like he is at 4 mets . I think his risk  for surgery would be acceptable for renal transplant.  The acute issue is that he has now had persistent atrial fibrillation/flutter. I have called Dr. Sharyne Richters who is his primary nephrologist to discuss anticoagulation. He is currently on high-dose aspirin?.  Last echocardiogram evaluation 8/17 doctor's [facility]

## 2017-02-06 ENCOUNTER — Telehealth: Payer: Self-pay | Admitting: Internal Medicine

## 2017-02-06 NOTE — Telephone Encounter (Signed)
New message    Pt states the office won't call to request surgery clearance for his kidney transplant. I advised him that it is protocol that the performing Dr office give [place] a call to request it so that we have the appropriate information. He could not give me much information and what I have below is all of the information he was able to provide.  Request for surgical clearance:  1. What type of surgery is being performed? Kidney transplant   2. When is this surgery scheduled? Won't schedule until letter   3. Are there any medications that need to be held prior to surgery and how long? no  4. Name of physician performing surgery? unsure  5. What is your office phone and fax number? unsure

## 2017-02-06 NOTE — Telephone Encounter (Signed)
I attempted to contact pt at [phone], received recorded message the subscriber at this number has been changed, disconnected or no longer in service

## 2017-02-07 ENCOUNTER — Encounter: Payer: Self-pay | Admitting: Physician Assistant

## 2017-02-07 NOTE — Telephone Encounter (Signed)
I attempted to call the patient at his contact #- no longer in service. I routed a copy of Dr. Olin Pia note to Dr. Lorrene Reid (renal) to see if she could help facilitate where to send this.

## 2017-02-18 NOTE — Progress Notes (Deleted)
Cardiology Office Note:    Date:  02/18/2017   ID:  Jeffrey [name], DOB [birth year], MRN [id number]  PCP:  Jeffrey Coyer, MD  Cardiologist:  Dr. Sherren [name]   Electrophysiologist:  Dr. Virl [name]   Referring MD: Jeffrey [name], Jeffrey Rossetti, MD   No chief complaint on file. ***  History of Present Illness:    Jeffrey [name] is a 76 y.o. male with a hx of ESRD, aortic stenosis, prior CVA, CAD with chronic occlusion of the RCA, s/p PPM, combined systolic and diastolic CHF.  He underwent TAVR in 8/16.  He was last seen by Dr. Sherren [name] in 9/17.  Last seen by Dr. Virl [name] 01/31/17.  Interrogation of his device demonstrated atrial fibrillation.  Dr. Caryl [name] was to consult with Nephrology and get him set up for anticoagulation.    Prior CV studies:   The following studies were reviewed today:  Echo 07/19/16 Mod LVH, sept-lat dyssynchrony c/w RV pacing, EF 45-50, diff HK, Gr 1 DD, s/p TAVR (mean 12; trivial AI), mildly dilated aortic root (39), ascending aorta 38, MAC, mod lAE, mild RAE, PASP 25  R/L HC 8/16 RCA mid 100 (L-R collats) LCx prox 20, mid 30, OM3 20 LAD mid 64, D2 ost 40  Past Medical History:  Diagnosis Date  . Anginal pain ([place])   . Aortic stenosis    a. severe by echo 09/2014  . Atrial fibrillation ([place])    a. not well documented, not on anticoagulation  . CHF (congestive heart failure) ([place])   . Claustrophobia   . Complete heart block ([place])   . Coronary artery disease    a. chronically occluded RCA per cath 09/2014 with collaterals  . CVA (cerebral vascular accident) ([place]) 10/2014   denies residual on 07/11/2015  . ESRD (end stage renal disease) on dialysis [facility])    a. on dialysis; [facility]; MWF, LUE fistula (07/11/2015)  . History of blood transfusion    "related to gallbladder OR"  . History of stomach ulcers   . Hyperlipidemia   . Hypertension   . Iron deficiency anemia   . Myocardial infarction 10/2014  . Peripheral vascular disease ([place])   .  Pneumonia   . Presence of permanent cardiac pacemaker   . Renal insufficiency   . 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 Type II diabetes mellitus ([place])     Past Surgical History:  Procedure Laterality Date  . AV FISTULA PLACEMENT Left 10/19/2014   Procedure: BRACHIOCEPHALIC ARTERIOVENOUS (AV) FISTULA CREATION ;  Surgeon: Jeffrey [name] , MD;  Location: [place];  Service: Vascular;  Laterality: Left;  . CARDIAC CATHETERIZATION    . CARDIAC CATHETERIZATION N/A 07/22/2015   Procedure: Right/Left Heart Cath and Coronary Angiography;  Surgeon: Jeffrey Blanks, MD;  Location: [place] CV LAB;  Service: Cardiovascular;  Laterality: N/A;  . CATARACT EXTRACTION W/ INTRAOCULAR LENS  IMPLANT, BILATERAL Bilateral 1990's  . CHOLECYSTECTOMY OPEN  1980's  . COLONOSCOPY W/ BIOPSIES AND POLYPECTOMY    . CORONARY ANGIOPLASTY    . EP IMPLANTABLE DEVICE N/A 07/11/2015   Procedure: Pacemaker Implant;  Surgeon: Jeffrey Meredith Leeds, MD;  Location: [place] CV LAB;  Service: Cardiovascular;  Laterality: N/A;  . ESOPHAGOGASTRODUODENOSCOPY  08/01/2012   Procedure: ESOPHAGOGASTRODUODENOSCOPY (EGD);  Surgeon: [place], MD;  Location[facility];  Service: Endoscopy;  Laterality: N/A;  . INSERT /  REPLACE / REMOVE PACEMAKER  07/11/2015  . INSERTION OF DIALYSIS CATHETER Right 02/02/2015   Procedure: INSERTION OF DIALYSIS CATHETER  RIGHT INTERNAL JUGULAR;  Surgeon: Jeffrey [name], MD;  Location: [place];  Service: Vascular;  Laterality: Right;  . LEFT AND RIGHT HEART CATHETERIZATION WITH CORONARY ANGIOGRAM N/A 09/30/2014   Procedure: LEFT AND RIGHT HEART CATHETERIZATION WITH CORONARY ANGIOGRAM;  Surgeon: Jeffrey Sine, MD;  Location[facility] CATH LAB;  Service: Cardiovascular;  Laterality: N/A;  . TEE WITHOUT CARDIOVERSION N/A 08/02/2015   Procedure: TRANSESOPHAGEAL ECHOCARDIOGRAM (TEE);  Surgeon: Jeffrey Mocha, MD;  Location: [place];  Service: Open Heart Surgery;  Laterality: N/A;  . TONSILLECTOMY    . TRANSCATHETER AORTIC VALVE REPLACEMENT, TRANSFEMORAL Left 08/02/2015   Procedure: TRANSCATHETER AORTIC VALVE REPLACEMENT, TRANSFEMORAL;  Surgeon: Jeffrey Mocha, MD;  Location: [place];  Service: Open Heart Surgery;  Laterality: Left;    Current Medications: No outpatient prescriptions have been marked as taking for the 02/19/17 encounter (Appointment) with Jeffrey Shi, PA-C.     Allergies:   Byetta 10 mcg pen [exenatide] and Codeine   Social History   Social History  . Marital status: Married    Spouse name: N/A  . Number of children: N/A  . Years of education: N/A   Social History Main Topics  . Smoking status: Former Smoker    Packs/day: 2.00    Years: 32.00    Quit date: 11/27/1983  . Smokeless tobacco: Never Used  . Alcohol use Yes     Comment: 07/11/2015 "stopped drinking in 09/2014"  . Drug use: No  . Sexual activity: Not on file   Other Topics Concern  . Not on file   Social History Narrative  . No narrative on file     Family History  Problem Relation Age of Onset  . Diabetes Father   . Heart disease Father   . Diabetes Sister      ROS:   Please see the history of present illness.    ROS All other systems reviewed and are negative.   EKGs/Labs/Other Test Reviewed:    EKG:  EKG is *** ordered today.  The ekg ordered today demonstrates ***  Recent Labs: 01/23/2017: ALT 10; BUN 68; Creatinine, Ser 6.48; Hemoglobin 11.4; Platelets 215; Potassium 4.4; Sodium 136   Recent Lipid Panel    Component Value Date/Time   CHOL 100 04/28/2015 0703   TRIG 121 04/28/2015 0703   HDL 28 (L) 04/28/2015 0703   CHOLHDL 3.6 04/28/2015 0703   VLDL 24 04/28/2015 0703   LDLCALC 48 04/28/2015 0703     Physical Exam:    VS:  There were no vitals taken for this visit.    Wt Readings from Last 3 Encounters:  01/31/17 239 lb 3.2 oz (108.5 kg)  01/23/17 236 lb (107 kg)    08/07/16 234 lb (106.1 kg)     ***Physical Exam  ASSESSMENT:    1. Coronary artery disease involving native coronary artery of native heart without angina pectoris   2. Chronic combined systolic and diastolic CHF (congestive heart failure) ([place])   3. ESRD (end stage renal disease) on dialysis ([place])   4. S/P TAVR (transcatheter aortic valve replacement)   5. Essential hypertension   6. Other hyperlipidemia    PLAN:    In order of problems listed above:  Coronary artery disease involving native coronary artery of native heart without angina pectoris  Chronic combined systolic and diastolic CHF (congestive heart failure) ([place])  ESRD (end stage renal disease) on dialysis ([place])  S/P TAVR (transcatheter aortic valve replacement)  Essential hypertension  Other hyperlipidemia***  Dispo:  No Follow-up on file.   Medication Adjustments/Labs and Tests Ordered: Current medicines are reviewed at length with the patient today.  Concerns regarding medicines are outlined above.  Medication changes, Labs and Tests ordered today are outlined in the Patient Instructions noted below. There are no Patient Instructions on file for this visit. Signed, Richardson Dopp, PA-C  02/18/2017 10:33 PM    [facility] [place], [place] [address], [place]  [address] Phone: [phone]; Fax: [phone]

## 2017-02-19 ENCOUNTER — Ambulatory Visit: Payer: Medicare Other | Admitting: Physician Assistant

## 2017-03-09 ENCOUNTER — Inpatient Hospital Stay (HOSPITAL_COMMUNITY)
Admission: EM | Admit: 2017-03-09 | Discharge: 2017-03-14 | DRG: 190 | Disposition: A | Discharge status: Home / Self Care | Payer: Medicare Other | Attending: Family Medicine | Admitting: Family Medicine

## 2017-03-09 ENCOUNTER — Encounter (HOSPITAL_COMMUNITY): Payer: Self-pay | Admitting: Emergency Medicine

## 2017-03-09 ENCOUNTER — Emergency Department (HOSPITAL_COMMUNITY): Payer: Medicare Other

## 2017-03-09 DIAGNOSIS — E1165 Type 2 diabetes mellitus with hyperglycemia: Secondary | ICD-10-CM | POA: Diagnosis present

## 2017-03-09 DIAGNOSIS — E785 Hyperlipidemia, unspecified: Secondary | ICD-10-CM | POA: Diagnosis present

## 2017-03-09 DIAGNOSIS — I5022 Chronic systolic (congestive) heart failure: Secondary | ICD-10-CM | POA: Diagnosis present

## 2017-03-09 DIAGNOSIS — Z952 Presence of prosthetic heart valve: Secondary | ICD-10-CM

## 2017-03-09 DIAGNOSIS — Z87891 Personal history of nicotine dependence: Secondary | ICD-10-CM

## 2017-03-09 DIAGNOSIS — Z95810 Presence of automatic (implantable) cardiac defibrillator: Secondary | ICD-10-CM | POA: Diagnosis present

## 2017-03-09 DIAGNOSIS — J441 Chronic obstructive pulmonary disease with (acute) exacerbation: Secondary | ICD-10-CM | POA: Diagnosis not present

## 2017-03-09 DIAGNOSIS — E669 Obesity, unspecified: Secondary | ICD-10-CM | POA: Diagnosis present

## 2017-03-09 DIAGNOSIS — E11649 Type 2 diabetes mellitus with hypoglycemia without coma: Secondary | ICD-10-CM

## 2017-03-09 DIAGNOSIS — Z6832 Body mass index (BMI) 32.0-32.9, adult: Secondary | ICD-10-CM

## 2017-03-09 DIAGNOSIS — Z950 Presence of cardiac pacemaker: Secondary | ICD-10-CM

## 2017-03-09 DIAGNOSIS — I25119 Atherosclerotic heart disease of native coronary artery with unspecified angina pectoris: Secondary | ICD-10-CM | POA: Diagnosis present

## 2017-03-09 DIAGNOSIS — Z7982 Long term (current) use of aspirin: Secondary | ICD-10-CM

## 2017-03-09 DIAGNOSIS — R05 Cough: Secondary | ICD-10-CM

## 2017-03-09 DIAGNOSIS — R0603 Acute respiratory distress: Secondary | ICD-10-CM | POA: Diagnosis present

## 2017-03-09 DIAGNOSIS — R062 Wheezing: Secondary | ICD-10-CM

## 2017-03-09 DIAGNOSIS — Z794 Long term (current) use of insulin: Secondary | ICD-10-CM

## 2017-03-09 DIAGNOSIS — J9621 Acute and chronic respiratory failure with hypoxia: Secondary | ICD-10-CM | POA: Diagnosis present

## 2017-03-09 DIAGNOSIS — Z79899 Other long term (current) drug therapy: Secondary | ICD-10-CM

## 2017-03-09 DIAGNOSIS — D638 Anemia in other chronic diseases classified elsewhere: Secondary | ICD-10-CM | POA: Diagnosis present

## 2017-03-09 DIAGNOSIS — Z992 Dependence on renal dialysis: Secondary | ICD-10-CM

## 2017-03-09 DIAGNOSIS — I132 Hypertensive heart and chronic kidney disease with heart failure and with stage 5 chronic kidney disease, or end stage renal disease: Secondary | ICD-10-CM | POA: Diagnosis present

## 2017-03-09 DIAGNOSIS — I10 Essential (primary) hypertension: Secondary | ICD-10-CM | POA: Diagnosis present

## 2017-03-09 DIAGNOSIS — R059 Cough, unspecified: Secondary | ICD-10-CM

## 2017-03-09 DIAGNOSIS — Z9861 Coronary angioplasty status: Secondary | ICD-10-CM

## 2017-03-09 DIAGNOSIS — Z953 Presence of xenogenic heart valve: Secondary | ICD-10-CM

## 2017-03-09 DIAGNOSIS — I252 Old myocardial infarction: Secondary | ICD-10-CM

## 2017-03-09 DIAGNOSIS — N186 End stage renal disease: Secondary | ICD-10-CM

## 2017-03-09 DIAGNOSIS — E1122 Type 2 diabetes mellitus with diabetic chronic kidney disease: Secondary | ICD-10-CM | POA: Diagnosis present

## 2017-03-09 DIAGNOSIS — I2510 Atherosclerotic heart disease of native coronary artery without angina pectoris: Secondary | ICD-10-CM | POA: Diagnosis present

## 2017-03-09 DIAGNOSIS — D631 Anemia in chronic kidney disease: Secondary | ICD-10-CM | POA: Diagnosis present

## 2017-03-09 DIAGNOSIS — E871 Hypo-osmolality and hyponatremia: Secondary | ICD-10-CM | POA: Diagnosis present

## 2017-03-09 DIAGNOSIS — M898X9 Other specified disorders of bone, unspecified site: Secondary | ICD-10-CM | POA: Diagnosis present

## 2017-03-09 DIAGNOSIS — Z951 Presence of aortocoronary bypass graft: Secondary | ICD-10-CM

## 2017-03-09 DIAGNOSIS — R0602 Shortness of breath: Secondary | ICD-10-CM

## 2017-03-09 DIAGNOSIS — M6282 Rhabdomyolysis: Secondary | ICD-10-CM | POA: Diagnosis present

## 2017-03-09 DIAGNOSIS — E1151 Type 2 diabetes mellitus with diabetic peripheral angiopathy without gangrene: Secondary | ICD-10-CM | POA: Diagnosis present

## 2017-03-09 DIAGNOSIS — N2581 Secondary hyperparathyroidism of renal origin: Secondary | ICD-10-CM | POA: Diagnosis present

## 2017-03-09 DIAGNOSIS — Z8673 Personal history of transient ischemic attack (TIA), and cerebral infarction without residual deficits: Secondary | ICD-10-CM

## 2017-03-09 LAB — CBC
HCT: 30.6 % — ABNORMAL LOW (ref 39.0–52.0)
HEMOGLOBIN: 10.3 g/dL — AB (ref 13.0–17.0)
MCH: 31.4 pg (ref 26.0–34.0)
MCHC: 33.7 g/dL (ref 30.0–36.0)
MCV: 93.3 fL (ref 78.0–100.0)
PLATELETS: 205 10*3/uL (ref 150–400)
RBC: 3.28 MIL/uL — ABNORMAL LOW (ref 4.22–5.81)
RDW: 13.6 % (ref 11.5–15.5)
WBC: 7 10*3/uL (ref 4.0–10.5)

## 2017-03-09 LAB — COMPREHENSIVE METABOLIC PANEL
ALBUMIN: 3.6 g/dL (ref 3.5–5.0)
ALT: 15 U/L — ABNORMAL LOW (ref 17–63)
ANION GAP: 12 (ref 5–15)
AST: 21 U/L (ref 15–41)
Alkaline Phosphatase: 95 U/L (ref 38–126)
BILIRUBIN TOTAL: 1 mg/dL (ref 0.3–1.2)
BUN: 28 mg/dL — ABNORMAL HIGH (ref 6–20)
CALCIUM: 7.9 mg/dL — AB (ref 8.9–10.3)
CO2: 26 mmol/L (ref 22–32)
Chloride: 95 mmol/L — ABNORMAL LOW (ref 101–111)
Creatinine, Ser: 5.35 mg/dL — ABNORMAL HIGH (ref 0.61–1.24)
GFR calc Af Amer: 11 mL/min — ABNORMAL LOW (ref >60)
GFR calc non Af Amer: 9 mL/min — ABNORMAL LOW (ref >60)
GLUCOSE: 143 mg/dL — AB (ref 65–99)
POTASSIUM: 4.2 mmol/L (ref 3.5–5.1)
SODIUM: 133 mmol/L — AB (ref 135–145)
TOTAL PROTEIN: 7.1 g/dL (ref 6.5–8.1)

## 2017-03-09 LAB — LIPASE, BLOOD: LIPASE: 26 U/L (ref 11–51)

## 2017-03-09 LAB — I-STAT CG4 LACTIC ACID, ED: Lactic Acid, Venous: 0.85 mmol/L (ref 0.5–1.9)

## 2017-03-09 MED ORDER — IPRATROPIUM-ALBUTEROL 0.5-2.5 (3) MG/3ML IN SOLN
3.0000 mL | Freq: Once | RESPIRATORY_TRACT | Status: AC
  Administered 2017-03-09: 3 mL via RESPIRATORY_TRACT
  Filled 2017-03-09: qty 3

## 2017-03-09 MED ORDER — ALBUTEROL (5 MG/ML) CONTINUOUS INHALATION SOLN
10.0000 mg/h | INHALATION_SOLUTION | RESPIRATORY_TRACT | Status: DC
  Administered 2017-03-10: 10 mg/h via RESPIRATORY_TRACT
  Filled 2017-03-09: qty 20

## 2017-03-09 MED ORDER — ONDANSETRON 4 MG PO TBDP
ORAL_TABLET | ORAL | Status: AC
  Filled 2017-03-09: qty 1

## 2017-03-09 MED ORDER — ONDANSETRON 4 MG PO TBDP
4.0000 mg | ORAL_TABLET | Freq: Once | ORAL | Status: AC | PRN
  Administered 2017-03-09: 4 mg via ORAL

## 2017-03-09 NOTE — ED Triage Notes (Signed)
C/O sob, nausea/vomiting/diarrhea since Wednesday.  Per family some confusion.  Also reports pain in center of chest worse with cough.  Productive cough.  Lung sounds coarse in triage.

## 2017-03-10 ENCOUNTER — Encounter (HOSPITAL_COMMUNITY): Payer: Self-pay | Admitting: Family Medicine

## 2017-03-10 DIAGNOSIS — I25119 Atherosclerotic heart disease of native coronary artery with unspecified angina pectoris: Secondary | ICD-10-CM | POA: Diagnosis not present

## 2017-03-10 DIAGNOSIS — J9601 Acute respiratory failure with hypoxia: Secondary | ICD-10-CM | POA: Diagnosis not present

## 2017-03-10 DIAGNOSIS — D649 Anemia, unspecified: Secondary | ICD-10-CM | POA: Diagnosis not present

## 2017-03-10 DIAGNOSIS — I10 Essential (primary) hypertension: Secondary | ICD-10-CM

## 2017-03-10 DIAGNOSIS — Z95810 Presence of automatic (implantable) cardiac defibrillator: Secondary | ICD-10-CM | POA: Diagnosis not present

## 2017-03-10 DIAGNOSIS — E871 Hypo-osmolality and hyponatremia: Secondary | ICD-10-CM

## 2017-03-10 DIAGNOSIS — Z952 Presence of prosthetic heart valve: Secondary | ICD-10-CM | POA: Diagnosis not present

## 2017-03-10 DIAGNOSIS — J441 Chronic obstructive pulmonary disease with (acute) exacerbation: Secondary | ICD-10-CM | POA: Diagnosis not present

## 2017-03-10 DIAGNOSIS — Z992 Dependence on renal dialysis: Secondary | ICD-10-CM | POA: Diagnosis not present

## 2017-03-10 DIAGNOSIS — Z794 Long term (current) use of insulin: Secondary | ICD-10-CM

## 2017-03-10 DIAGNOSIS — N186 End stage renal disease: Secondary | ICD-10-CM | POA: Diagnosis not present

## 2017-03-10 DIAGNOSIS — E1122 Type 2 diabetes mellitus with diabetic chronic kidney disease: Secondary | ICD-10-CM | POA: Diagnosis not present

## 2017-03-10 LAB — GLUCOSE, CAPILLARY
GLUCOSE-CAPILLARY: 332 mg/dL — AB (ref 65–99)
Glucose-Capillary: 206 mg/dL — ABNORMAL HIGH (ref 65–99)
Glucose-Capillary: 335 mg/dL — ABNORMAL HIGH (ref 65–99)
Glucose-Capillary: 197 mg/dL — ABNORMAL HIGH (ref 65–99)
Glucose-Capillary: 288 mg/dL — ABNORMAL HIGH (ref 65–99)

## 2017-03-10 LAB — RENAL FUNCTION PANEL
Albumin: 3.5 g/dL (ref 3.5–5.0)
Anion gap: 15 (ref 5–15)
BUN: 51 mg/dL — ABNORMAL HIGH (ref 6–20)
CO2: 24 mmol/L (ref 22–32)
Calcium: 7.4 mg/dL — ABNORMAL LOW (ref 8.9–10.3)
Chloride: 88 mmol/L — ABNORMAL LOW (ref 101–111)
Creatinine, Ser: 6.61 mg/dL — ABNORMAL HIGH (ref 0.61–1.24)
GFR calc Af Amer: 8 mL/min — ABNORMAL LOW (ref >60)
GFR calc non Af Amer: 7 mL/min — ABNORMAL LOW (ref >60)
Glucose, Bld: 253 mg/dL — ABNORMAL HIGH (ref 65–99)
Phosphorus: 4 mg/dL (ref 2.5–4.6)
Potassium: 3.6 mmol/L (ref 3.5–5.1)
Sodium: 127 mmol/L — ABNORMAL LOW (ref 135–145)

## 2017-03-10 LAB — BASIC METABOLIC PANEL
ANION GAP: 15 (ref 5–15)
BUN: 32 mg/dL — ABNORMAL HIGH (ref 6–20)
CO2: 24 mmol/L (ref 22–32)
Calcium: 7.4 mg/dL — ABNORMAL LOW (ref 8.9–10.3)
Chloride: 93 mmol/L — ABNORMAL LOW (ref 101–111)
Creatinine, Ser: 5.83 mg/dL — ABNORMAL HIGH (ref 0.61–1.24)
GFR calc Af Amer: 10 mL/min — ABNORMAL LOW (ref >60)
GFR calc non Af Amer: 8 mL/min — ABNORMAL LOW (ref >60)
GLUCOSE: 330 mg/dL — AB (ref 65–99)
POTASSIUM: 3.2 mmol/L — AB (ref 3.5–5.1)
Sodium: 132 mmol/L — ABNORMAL LOW (ref 135–145)

## 2017-03-10 LAB — RESPIRATORY PANEL BY PCR
ADENOVIRUS-RVPPCR: NOT DETECTED
BORDETELLA PERTUSSIS-RVPCR: NOT DETECTED
CHLAMYDOPHILA PNEUMONIAE-RVPPCR: NOT DETECTED
CORONAVIRUS NL63-RVPPCR: NOT DETECTED
CORONAVIRUS OC43-RVPPCR: NOT DETECTED
Coronavirus 229E: NOT DETECTED
Coronavirus HKU1: NOT DETECTED
INFLUENZA B-RVPPCR: NOT DETECTED
Influenza A: NOT DETECTED
METAPNEUMOVIRUS-RVPPCR: NOT DETECTED
MYCOPLASMA PNEUMONIAE-RVPPCR: NOT DETECTED
PARAINFLUENZA VIRUS 3-RVPPCR: DETECTED — AB
PARAINFLUENZA VIRUS 4-RVPPCR: NOT DETECTED
Parainfluenza Virus 1: NOT DETECTED
Parainfluenza Virus 2: NOT DETECTED
Respiratory Syncytial Virus: NOT DETECTED
Rhinovirus / Enterovirus: NOT DETECTED

## 2017-03-10 LAB — CBC
HEMATOCRIT: 28.5 % — AB (ref 39.0–52.0)
HEMOGLOBIN: 9.4 g/dL — AB (ref 13.0–17.0)
MCH: 31.1 pg (ref 26.0–34.0)
MCHC: 33 g/dL (ref 30.0–36.0)
MCV: 94.4 fL (ref 78.0–100.0)
Platelets: 165 10*3/uL (ref 150–400)
RBC: 3.02 MIL/uL — AB (ref 4.22–5.81)
RDW: 13.7 % (ref 11.5–15.5)
WBC: 6.2 10*3/uL (ref 4.0–10.5)

## 2017-03-10 LAB — PROCALCITONIN: Procalcitonin: 0.17 ng/mL

## 2017-03-10 LAB — MRSA PCR SCREENING: MRSA BY PCR: NEGATIVE

## 2017-03-10 LAB — INFLUENZA PANEL BY PCR (TYPE A & B)
Influenza A By PCR: NEGATIVE
Influenza B By PCR: NEGATIVE

## 2017-03-10 LAB — BRAIN NATRIURETIC PEPTIDE: B Natriuretic Peptide: 656.1 pg/mL — ABNORMAL HIGH (ref 0.0–100.0)

## 2017-03-10 MED ORDER — BENZONATATE 100 MG PO CAPS
100.0000 mg | ORAL_CAPSULE | Freq: Two times a day (BID) | ORAL | Status: DC | PRN
  Administered 2017-03-10 – 2017-03-12 (×4): 100 mg via ORAL
  Filled 2017-03-10 (×4): qty 1

## 2017-03-10 MED ORDER — AMLODIPINE BESYLATE 10 MG PO TABS
10.0000 mg | ORAL_TABLET | Freq: Every day | ORAL | Status: DC
  Administered 2017-03-10 – 2017-03-14 (×5): 10 mg via ORAL
  Filled 2017-03-10 (×5): qty 1

## 2017-03-10 MED ORDER — INSULIN DETEMIR 100 UNIT/ML ~~LOC~~ SOLN
45.0000 [IU] | Freq: Every day | SUBCUTANEOUS | Status: DC
Start: 2017-03-10 — End: 2017-03-11
  Administered 2017-03-10: 45 [IU] via SUBCUTANEOUS
  Filled 2017-03-10: qty 0.45

## 2017-03-10 MED ORDER — POTASSIUM CHLORIDE CRYS ER 20 MEQ PO TBCR
30.0000 meq | EXTENDED_RELEASE_TABLET | Freq: Once | ORAL | Status: AC
  Administered 2017-03-10: 30 meq via ORAL
  Filled 2017-03-10: qty 1

## 2017-03-10 MED ORDER — DOXERCALCIFEROL 4 MCG/2ML IV SOLN
2.0000 ug | INTRAVENOUS | Status: DC
  Administered 2017-03-11 – 2017-03-13 (×2): 2 ug via INTRAVENOUS
  Filled 2017-03-10 (×2): qty 2

## 2017-03-10 MED ORDER — DOXYCYCLINE HYCLATE 100 MG IV SOLR
100.0000 mg | Freq: Two times a day (BID) | INTRAVENOUS | Status: DC
  Administered 2017-03-10 – 2017-03-13 (×8): 100 mg via INTRAVENOUS
  Filled 2017-03-10 (×9): qty 100

## 2017-03-10 MED ORDER — ONDANSETRON HCL 4 MG/2ML IJ SOLN
4.0000 mg | Freq: Four times a day (QID) | INTRAMUSCULAR | Status: DC | PRN
  Administered 2017-03-10: 4 mg via INTRAVENOUS
  Filled 2017-03-10: qty 2

## 2017-03-10 MED ORDER — METHYLPREDNISOLONE SODIUM SUCC 40 MG IJ SOLR
40.0000 mg | Freq: Every day | INTRAMUSCULAR | Status: DC
  Administered 2017-03-10: 40 mg via INTRAVENOUS
  Filled 2017-03-10: qty 1

## 2017-03-10 MED ORDER — ALBUTEROL SULFATE (2.5 MG/3ML) 0.083% IN NEBU
2.5000 mg | INHALATION_SOLUTION | RESPIRATORY_TRACT | Status: DC | PRN
  Administered 2017-03-10: 2.5 mg via RESPIRATORY_TRACT
  Filled 2017-03-10: qty 3

## 2017-03-10 MED ORDER — IPRATROPIUM-ALBUTEROL 0.5-2.5 (3) MG/3ML IN SOLN
3.0000 mL | Freq: Four times a day (QID) | RESPIRATORY_TRACT | Status: DC
  Administered 2017-03-10: 3 mL via RESPIRATORY_TRACT
  Filled 2017-03-10: qty 3

## 2017-03-10 MED ORDER — INSULIN ASPART 100 UNIT/ML ~~LOC~~ SOLN
0.0000 [IU] | Freq: Three times a day (TID) | SUBCUTANEOUS | Status: DC
  Administered 2017-03-10: 15 [IU] via SUBCUTANEOUS
  Administered 2017-03-10: 4 [IU] via SUBCUTANEOUS
  Administered 2017-03-10: 15 [IU] via SUBCUTANEOUS
  Administered 2017-03-11: 18 [IU] via SUBCUTANEOUS
  Administered 2017-03-11: 15 [IU] via SUBCUTANEOUS
  Administered 2017-03-12: 7 [IU] via SUBCUTANEOUS
  Administered 2017-03-12: 11 [IU] via SUBCUTANEOUS
  Administered 2017-03-12 – 2017-03-13 (×2): 7 [IU] via SUBCUTANEOUS
  Administered 2017-03-13 – 2017-03-14 (×2): 11 [IU] via SUBCUTANEOUS

## 2017-03-10 MED ORDER — ATORVASTATIN CALCIUM 40 MG PO TABS
40.0000 mg | ORAL_TABLET | Freq: Every day | ORAL | Status: DC
  Administered 2017-03-10 – 2017-03-12 (×3): 40 mg via ORAL
  Filled 2017-03-10 (×3): qty 1

## 2017-03-10 MED ORDER — ASPIRIN EC 325 MG PO TBEC
325.0000 mg | DELAYED_RELEASE_TABLET | Freq: Every day | ORAL | Status: DC
  Administered 2017-03-10 – 2017-03-14 (×5): 325 mg via ORAL
  Filled 2017-03-10 (×5): qty 1

## 2017-03-10 MED ORDER — RENA-VITE PO TABS
1.0000 | ORAL_TABLET | Freq: Every day | ORAL | Status: DC
  Administered 2017-03-10 – 2017-03-13 (×4): 1 via ORAL
  Filled 2017-03-10 (×4): qty 1

## 2017-03-10 MED ORDER — METHYLPREDNISOLONE SODIUM SUCC 40 MG IJ SOLR
40.0000 mg | Freq: Two times a day (BID) | INTRAMUSCULAR | Status: DC
  Administered 2017-03-10 – 2017-03-11 (×3): 40 mg via INTRAVENOUS
  Filled 2017-03-10 (×3): qty 1

## 2017-03-10 MED ORDER — INSULIN ASPART 100 UNIT/ML ~~LOC~~ SOLN
15.0000 [IU] | Freq: Three times a day (TID) | SUBCUTANEOUS | Status: DC
  Administered 2017-03-10: 15 [IU] via SUBCUTANEOUS

## 2017-03-10 MED ORDER — INSULIN DETEMIR 100 UNIT/ML ~~LOC~~ SOLN: 38.0000 [IU] | Freq: Every day | SUBCUTANEOUS | Status: DC

## 2017-03-10 MED ORDER — METOPROLOL TARTRATE 12.5 MG HALF TABLET
12.5000 mg | ORAL_TABLET | Freq: Two times a day (BID) | ORAL | Status: DC
  Administered 2017-03-10 – 2017-03-13 (×6): 12.5 mg via ORAL
  Filled 2017-03-10 (×9): qty 1

## 2017-03-10 MED ORDER — HEPARIN SODIUM (PORCINE) 5000 UNIT/ML IJ SOLN
5000.0000 [IU] | Freq: Three times a day (TID) | INTRAMUSCULAR | Status: DC
  Administered 2017-03-10 – 2017-03-14 (×12): 5000 [IU] via SUBCUTANEOUS
  Filled 2017-03-10 (×11): qty 1

## 2017-03-10 MED ORDER — FAMOTIDINE 20 MG PO TABS
10.0000 mg | ORAL_TABLET | Freq: Every day | ORAL | Status: DC
  Administered 2017-03-10: 10 mg via ORAL
  Administered 2017-03-11 – 2017-03-12 (×2): 20 mg via ORAL
  Administered 2017-03-13: 10 mg via ORAL
  Filled 2017-03-10 (×4): qty 1

## 2017-03-10 MED ORDER — ALBUTEROL SULFATE (2.5 MG/3ML) 0.083% IN NEBU
2.5000 mg | INHALATION_SOLUTION | Freq: Four times a day (QID) | RESPIRATORY_TRACT | Status: DC
  Administered 2017-03-10: 2.5 mg via RESPIRATORY_TRACT
  Filled 2017-03-10: qty 3

## 2017-03-10 MED ORDER — CINACALCET HCL 30 MG PO TABS: 30.0000 mg | ORAL_TABLET | Freq: Every day | ORAL | Status: DC

## 2017-03-10 MED ORDER — ONDANSETRON HCL 4 MG/2ML IJ SOLN: 4.0000 mg | Freq: Four times a day (QID) | INTRAMUSCULAR | Status: DC | PRN

## 2017-03-10 MED ORDER — IPRATROPIUM-ALBUTEROL 0.5-2.5 (3) MG/3ML IN SOLN
3.0000 mL | Freq: Three times a day (TID) | RESPIRATORY_TRACT | Status: DC
  Administered 2017-03-10: 3 mL via RESPIRATORY_TRACT
  Filled 2017-03-10 (×2): qty 3

## 2017-03-10 MED ORDER — SEVELAMER CARBONATE 800 MG PO TABS
2400.0000 mg | ORAL_TABLET | Freq: Three times a day (TID) | ORAL | Status: DC
  Administered 2017-03-10 – 2017-03-11 (×5): 2400 mg via ORAL
  Filled 2017-03-10 (×5): qty 3

## 2017-03-10 MED ORDER — ACETAMINOPHEN 650 MG RE SUPP: 650.0000 mg | Freq: Four times a day (QID) | RECTAL | Status: DC | PRN

## 2017-03-10 MED ORDER — ACETAMINOPHEN 325 MG PO TABS
650.0000 mg | ORAL_TABLET | Freq: Four times a day (QID) | ORAL | Status: DC | PRN
  Administered 2017-03-11 – 2017-03-12 (×2): 650 mg via ORAL
  Filled 2017-03-10 (×2): qty 2

## 2017-03-10 MED ORDER — ONDANSETRON HCL 4 MG PO TABS: 4.0000 mg | ORAL_TABLET | Freq: Four times a day (QID) | ORAL | Status: DC | PRN

## 2017-03-10 MED ORDER — INSULIN ASPART 100 UNIT/ML ~~LOC~~ SOLN
10.0000 [IU] | Freq: Three times a day (TID) | SUBCUTANEOUS | Status: DC
  Administered 2017-03-10: 10 [IU] via SUBCUTANEOUS

## 2017-03-10 NOTE — ED Provider Notes (Signed)
[facility] DEPT Provider Note   CSN: [id number] Arrival date & time: 03/09/17  2132     History   Chief Complaint Chief Complaint  Patient presents with  . Cough  . Shortness of Breath  . Nausea    HPI Jeffrey [name] is a 76 y.o. male.  HPI Patient presents to the emergency department with confusion, fever, cough, body aches, nausea, vomiting since Wednesday.  Patient has increasing shortness of breath over the last 2 days.Patient states that the cough is keeping him at night.  He is not able lay flat due to his coughing.  Patient states that seems make the condition betterThe patient denies chest pain,  headache,blurred vision, neck pain, fever, cough, weakness, numbness, dizziness, anorexia, edema, abdominal pain,diarrhea, rash, back pain, dysuria, hematemesis, bloody stool, near syncope, or syncope. Past Medical History:  Diagnosis Date  . Anginal pain ([place])   . Aortic stenosis    a. severe by echo 09/2014  . Atrial fibrillation ([place])    a. not well documented, not on anticoagulation  . CHF (congestive heart failure) ([place])   . Claustrophobia   . Complete heart block ([place])   . Coronary artery disease    a. chronically occluded RCA per cath 09/2014 with collaterals  . CVA (cerebral vascular accident) ([place]) 10/2014   denies residual on 07/11/2015  . ESRD (end stage renal disease) on dialysis [facility])    a. on dialysis; [facility]; MWF, LUE fistula (07/11/2015)  . History of blood transfusion    "related to gallbladder OR"  . History of stomach ulcers   . Hyperlipidemia   . Hypertension   . Iron deficiency anemia   . Myocardial infarction ([place]) 10/2014  . Peripheral vascular disease ([place])   . Pneumonia   . Presence of permanent cardiac pacemaker   . Renal insufficiency   . 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 Type II diabetes mellitus [facility])      Patient Active Problem List   Diagnosis Date Noted  . Non-healing open wound of left groin 09/05/2015  . Automatic implantable cardioverter-defibrillator in situ 08/08/2015  . 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 Hyperkalemia 07/27/2015  . Aortic stenosis   . Hypervolemia   . Severe aortic stenosis   . Chronic combined systolic and diastolic CHF (congestive heart failure) ([place]) 07/19/2015  . Fluid overload 07/18/2015  . Acute respiratory failure ([place]) 07/18/2015  . ESRD on hemodialysis ([place]) 07/18/2015  . Hypoxia   . Type 2 diabetes mellitus with complication ([place])   . Cardiac device in situ, other   . 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/2015  . Chest pain   . Atherosclerosis of native coronary artery of native heart with angina pectoris ([place])   . Type 2 diabetes mellitus with other circulatory complications ([place])   . End stage renal disease ([place]) 03/08/2015  . Protein-calorie malnutrition, severe ([place]) 01/14/2015  . ESRD (end stage renal disease) on dialysis ([place]) 01/12/2015  . Aortic valve stenosis, moderate 01/11/2015  . Mobitz type 1 second degree AV block   . Elevated troponin 09/30/2014  . History of stroke 09/29/2014  . DM (diabetes mellitus), type 2 ([place]) 09/29/2014  . HLD (hyperlipidemia) 09/29/2014  . CAD (coronary artery disease) 09/29/2014  . Atherosclerosis of coronary artery 09/29/2014  . Essential hypertension   . History of tobacco use 11/21/2012  . Breath  shortness 11/11/2012  . Hyperlipidemia 01/19/2010  . Arthritis, degenerative 01/19/2010  . Allergic rhinitis 12/05/2009  . Diabetes mellitus ([place]) 12/05/2009  . Idiopathic peripheral neuropathy 01/05/2009  . 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  . Absolute anemia 11/13/2007    Past Surgical History:  Procedure Laterality Date  . AV FISTULA PLACEMENT Left 10/19/2014   Procedure: BRACHIOCEPHALIC ARTERIOVENOUS (AV) FISTULA CREATION  ;  Surgeon: Conrad Fiddletown, MD;  Location: [facility];  Service: Vascular;  Laterality: Left;  . CARDIAC CATHETERIZATION    . CARDIAC CATHETERIZATION N/A 07/22/2015   Procedure: Right/Left Heart Cath and Coronary Angiography;  Surgeon: [name], MD;  Location: [facility] CV LAB;  Service: Cardiovascular;  Laterality: N/A;  . CATARACT EXTRACTION W/ INTRAOCULAR LENS  IMPLANT, BILATERAL Bilateral 1990's  . CHOLECYSTECTOMY OPEN  1980's  . COLONOSCOPY W/ BIOPSIES AND POLYPECTOMY    . CORONARY ANGIOPLASTY    . EP IMPLANTABLE DEVICE N/A 07/11/2015   Procedure: Pacemaker Implant;  Surgeon: [name], MD;  Location: [facility] CV LAB;  Service: Cardiovascular;  Laterality: N/A;  . ESOPHAGOGASTRODUODENOSCOPY  08/01/2012   Procedure: ESOPHAGOGASTRODUODENOSCOPY (EGD);  Surgeon: [name], MD;  Location: [facility];  Service: Endoscopy;  Laterality: N/A;  . INSERT / REPLACE / REMOVE PACEMAKER  07/11/2015  . INSERTION OF DIALYSIS CATHETER Right 02/02/2015   Procedure: INSERTION OF DIALYSIS CATHETER  RIGHT INTERNAL JUGULAR;  Surgeon: Mal [name], MD;  Location: [facility];  Service: Vascular;  Laterality: Right;  . LEFT AND RIGHT HEART CATHETERIZATION WITH CORONARY ANGIOGRAM N/A 09/30/2014   Procedure: LEFT AND RIGHT HEART CATHETERIZATION WITH CORONARY ANGIOGRAM;  Surgeon: [name], MD;  Location: [facility] CATH LAB;  Service: Cardiovascular;  Laterality: N/A;  . TEE WITHOUT CARDIOVERSION N/A 08/02/2015   Procedure: TRANSESOPHAGEAL ECHOCARDIOGRAM (TEE);  Surgeon: [name], MD;  Location: [facility];  Service: Open Heart Surgery;  Laterality: N/A;  . TONSILLECTOMY    . TRANSCATHETER AORTIC VALVE REPLACEMENT, TRANSFEMORAL Left 08/02/2015   Procedure: TRANSCATHETER AORTIC VALVE REPLACEMENT, TRANSFEMORAL;  Surgeon: [name], MD;  Location: [facility];  Service: Open Heart Surgery;  Laterality: Left;       Home Medications    Prior to Admission medications   Medication Sig Start Date End Date  Taking? Authorizing Provider  amLODipine (NORVASC) 10 MG tablet Take 10 mg by mouth daily.     Historical Provider, MD  aspirin EC 325 MG tablet Take 1 tablet (325 mg total) by mouth daily. 09/05/15   [name], MD  atorvastatin (LIPITOR) 40 MG tablet Take 1 tablet (40 mg total) by mouth daily at 6 PM. 08/05/15   Bhavinkumar Bhagat, PA  benzonatate (TESSALON) 100 MG capsule Take 1 capsule (100 mg total) by mouth 2 (two) times daily as needed for cough. 01/23/17   Gareth Morgan, MD  calcium carbonate (TUMS - DOSED IN MG ELEMENTAL CALCIUM) 500 MG chewable tablet Chew 1 tablet by mouth 4 (four) times daily as needed for indigestion or heartburn.    Historical Provider, MD  cinacalcet (SENSIPAR) 30 MG tablet Take 30 mg by mouth daily with supper.    Historical Provider, MD  Insulin Detemir (LEVEMIR FLEXTOUCH) 100 UNIT/ML Pen Inject 38 Units into the skin at bedtime.     Historical Provider, MD  lidocaine-prilocaine (EMLA) cream Apply 1 application topically once as needed (prior to accessing port).     Historical Provider, MD  metoprolol tartrate (LOPRESSOR) 25 MG tablet Take 0.5 tablets (12.5 mg total) by  mouth 2 (two) times daily. 08/05/15   Bhavinkumar Bhagat, PA  nitroGLYCERIN (NITROSTAT) 0.4 MG SL tablet Place 0.4 mg under the tongue every 5 (five) minutes as needed for chest pain.     Historical Provider, MD  ondansetron (ZOFRAN ODT) 4 MG disintegrating tablet Take 1 tablet (4 mg total) by mouth every 8 (eight) hours as needed for nausea or vomiting. 01/23/17   Gareth Morgan, MD  ondansetron (ZOFRAN) 8 MG tablet Take 8 mg by mouth daily as needed for nausea or vomiting.    Historical Provider, MD  ranitidine (ZANTAC) 150 MG tablet Take 150 mg by mouth 2 (two) times daily as needed for heartburn.    Historical Provider, MD  sevelamer carbonate (RENVELA) 800 MG tablet Take 2,400 mg by mouth 3 (three) times daily with meals.    Historical Provider, MD    Family History Family History  Problem  Relation Age of Onset  . Diabetes Father   . Heart disease Father   . Diabetes Sister     Social History Social History  Substance Use Topics  . Smoking status: Former Smoker    Packs/day: 2.00    Years: 32.00    Quit date: 11/27/1983  . Smokeless tobacco: Never Used  . Alcohol use Yes     Comment: 07/11/2015 "stopped drinking in 09/2014"     Allergies   Byetta 10 mcg pen [exenatide] and Codeine   Review of Systems Review of Systems  All other systems negative except as documented in the HPI. All pertinent positives and negatives as reviewed in the HPI. Physical Exam Updated Vital Signs BP (!) 159/65   Pulse (!) 55   Temp 99.2 F (37.3 C)   Resp 15   Ht 5' 11.5" (1.816 m)   Wt 107 kg   SpO2 95%   BMI 32.46 kg/m   Physical Exam  Constitutional: He is oriented to person, place, and time. He appears well-developed and well-nourished. No distress.  HENT:  Head: Normocephalic and atraumatic.  Mouth/Throat: Oropharynx is clear and moist.  Eyes: EOM are normal. Pupils are equal, round, and reactive to light.  Neck: Normal range of motion. Neck supple.  Cardiovascular: Normal rate, regular rhythm and normal heart sounds.  Exam reveals no gallop and no friction rub.   No murmur heard. Pulmonary/Chest: Effort normal. No respiratory distress. He has wheezes. He has no rales.  Abdominal: Soft. Bowel sounds are normal. He exhibits no distension. There is no tenderness.  Neurological: He is alert and oriented to person, place, and time. He exhibits normal muscle tone. Coordination normal.  Skin: Skin is warm and dry. Capillary refill takes less than 2 seconds. No rash noted. No erythema.  Psychiatric: He has a normal mood and affect. His behavior is normal.  Nursing note and vitals reviewed.    ED Treatments / Results  Labs (all labs ordered are listed, but only abnormal results are displayed) Labs Reviewed  COMPREHENSIVE METABOLIC PANEL - Abnormal; Notable for the  following:       Result Value   Sodium 133 (*)    Chloride 95 (*)    Glucose, Bld 143 (*)    BUN 28 (*)    Creatinine, Ser 5.35 (*)    Calcium 7.9 (*)    ALT 15 (*)    GFR calc non Af Amer 9 (*)    GFR calc Af Amer 11 (*)    All other components within normal limits  CBC - Abnormal; Notable for  the following:    RBC 3.28 (*)    Hemoglobin 10.3 (*)    HCT 30.6 (*)    All other components within normal limits  LIPASE, BLOOD  INFLUENZA PANEL BY PCR (TYPE A & B)  URINALYSIS, ROUTINE W REFLEX MICROSCOPIC  I-STAT CG4 LACTIC ACID, ED    EKG  EKG Interpretation  Date/Time:  Saturday March 09 2017 21:56:25 EDT Ventricular Rate:  55 PR Interval:    QRS Duration: 216 QT Interval:  526 QTC Calculation: 503 R Axis:   -78 Text Interpretation:  Ventricular-paced rhythm Abnormal ECG Confirmed by Gilford Raid MD, JULIE ([id number]) on 03/09/2017 11:06:06 PM       Radiology Dg Chest Port 1 View  Result Date: 03/09/2017 CLINICAL DATA:  Dyspnea, nausea, vomiting and diarrhea since Wednesday EXAM: PORTABLE CHEST 1 VIEW COMPARISON:  01/23/2017 FINDINGS: Borderline cardiomegaly with aortic valve repair. Aortic atherosclerosis with slight uncoiling of the thoracic aorta. No aneurysm. Right subclavian pacemaker apparatus with leads in the right atrium and right ventricle. Mild bilateral stable hilar fullness likely vascular in etiology. Minimal bibasilar atelectasis without pneumonic consolidation. No effusion or pneumothorax. No acute osseous abnormality. IMPRESSION: Stable cardiomegaly with aortic atherosclerosis. Bibasilar atelectasis without pneumonic consolidation. Mild central vascular congestion. Electronically Signed   By: Ashley Royalty M.D.   On: 03/09/2017 23:22    Procedures Procedures (including critical care time)  Medications Ordered in ED Medications  ondansetron (ZOFRAN-ODT) 4 MG disintegrating tablet (not administered)  albuterol (PROVENTIL,VENTOLIN) solution continuous neb (10 mg/hr  Nebulization New Bag/Given 03/10/17 0006)  ondansetron (ZOFRAN-ODT) disintegrating tablet 4 mg (4 mg Oral Given 03/09/17 2201)  ipratropium-albuterol (DUONEB) 0.5-2.5 (3) MG/3ML nebulizer solution 3 mL (3 mLs Nebulization Given 03/09/17 2319)     Initial Impression / Assessment and Plan / ED Course  I have reviewed the triage vital signs and the nursing notes.  Pertinent labs & imaging results that were available during my care of the patient were reviewed by me and considered in my medical decision making (see chart for details).     Patient will be admitted to the hospital for further evaluation and care.  I spoke with the [facility] hospitals will be down to admit the patient.  Patient has require an hour-long neb treatment, along with 4 other supportive care for his breathing  Final Clinical Impressions(s) / ED Diagnoses   Final diagnoses:  None    New Prescriptions New Prescriptions   No medications on file     Dalia Heading, PA-C 03/14/17 0042    Isla Pence, MD 03/14/17 2314

## 2017-03-10 NOTE — H&P (Signed)
History and Physical  Patient Name: Jeffrey [name]     VZC:[id number]    DOB: 1941/09/05    DOA: 03/09/2017 PCP: Thurman Coyer, MD   Patient coming from: Home  Chief Complaint: Dyspnea, cough  HPI: Jeffrey [name] is a 76 y.o. male with a past medical history significant for ESRD on HD MWF, AS s/p TAVR, CHB with pacer, and IDDM who presents with worsening dyspnea, cough, and malaise.  The patient was in his usual state of health until 4 days ago, Wednesday when he developed malaise, episodes of vomiting and diarrhea, myalgias, productive cough, and pleuritic chest pain. Over the next 3 days, cough worsened, and he developed dyspnea with minimal exertion. He has wheezing, productive cough without hemoptysis, subjective fevers, shortness of breath with exertion,. No leg swelling, no change in his chronic orthopnea, no paroxysmal nocturnal dyspnea.  Finally today, family thought he was "talking out of his head" (I.e. Couldn't remember that his daughter had driven him to the ER, was asking why he hadn't seen her all day), so they brought him to the hospital.  ED course: -Temp 99.67F, heart rate 55, respirations 18, blood pressure 120/76, pulse oximetry 94% on room air -Na 133, K 4.2, Cr 5.35, WBC 7K, Hgb 10.3 -Lipase normal -Lactate 0.85 -Influenza PCR negative -Chest x-ray no focal opacity or pneumonia -ECG showed paced rhythm -He was given bronchodilators and had slight improvement, but was still extremely dyspneic working hard to breathe with any exertion, some TRH are asked to evaluate for dyspnea, hypoxic respiratory failure    The patient has a 30+ year history of smoking, denies diagnosis of COPD, inhaler use. Prior PFTs done by Dr. Annamaria Boots 2 years ago shows that he has obstruction, air trapping, and FEV1 mid 50s.  Wife has had a URI this week.     ROS: Review of Systems  Constitutional: Positive for fever and malaise/fatigue.  Respiratory: Positive for cough, sputum production,  shortness of breath and wheezing.   Cardiovascular: Positive for chest pain and orthopnea (chronic). Negative for leg swelling and PND.  Genitourinary: Negative for dysuria, frequency, hematuria and urgency.  Musculoskeletal: Positive for myalgias.  All other systems reviewed and are negative.         Past Medical History:  Diagnosis Date  . Anginal pain ([place])   . Aortic stenosis    a. severe by echo 09/2014  . Atrial fibrillation ([place])    a. not well documented, not on anticoagulation  . CHF (congestive heart failure) ([place])   . Claustrophobia   . Complete heart block ([place])   . Coronary artery disease    a. chronically occluded RCA per cath 09/2014 with collaterals  . CVA (cerebral vascular accident) ([place]) 10/2014   denies residual on 07/11/2015  . ESRD (end stage renal disease) on dialysis [facility])    a. on dialysis; [facility]; MWF, LUE fistula (07/11/2015)  . History of blood transfusion    "related to gallbladder OR"  . History of stomach ulcers   . Hyperlipidemia   . Hypertension   . Iron deficiency anemia   . Myocardial infarction ([place]) 10/2014  . Peripheral vascular disease ([place])   . Pneumonia   . Presence of permanent cardiac pacemaker   . Renal insufficiency   . 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 Type II diabetes mellitus [facility])     Past Surgical  History:  Procedure Laterality Date  . AV FISTULA PLACEMENT Left 10/19/2014   Procedure: BRACHIOCEPHALIC ARTERIOVENOUS (AV) FISTULA CREATION ;  Surgeon: Conrad Courtland, MD;  Location: [employer];  Service: Vascular;  Laterality: Left;  . CARDIAC CATHETERIZATION    . CARDIAC CATHETERIZATION N/A 07/22/2015   Procedure: Right/Left Heart Cath and Coronary Angiography;  Surgeon: [name], MD;  Location: [place] CV LAB;  Service: Cardiovascular;  Laterality: N/A;  . CATARACT EXTRACTION W/  INTRAOCULAR LENS  IMPLANT, BILATERAL Bilateral 1990's  . CHOLECYSTECTOMY OPEN  1980's  . COLONOSCOPY W/ BIOPSIES AND POLYPECTOMY    . CORONARY ANGIOPLASTY    . EP IMPLANTABLE DEVICE N/A 07/11/2015   Procedure: Pacemaker Implant;  Surgeon: [name], MD;  Location: [place] CV LAB;  Service: Cardiovascular;  Laterality: N/A;  . ESOPHAGOGASTRODUODENOSCOPY  08/01/2012   Procedure: ESOPHAGOGASTRODUODENOSCOPY (EGD);  Surgeon: [name], MD;  Location: [facility];  Service: Endoscopy;  Laterality: N/A;  . INSERT / REPLACE / REMOVE PACEMAKER  07/11/2015  . INSERTION OF DIALYSIS CATHETER Right 02/02/2015   Procedure: INSERTION OF DIALYSIS CATHETER  RIGHT INTERNAL JUGULAR;  Surgeon: Mal [name], MD;  Location: [facility];  Service: Vascular;  Laterality: Right;  . LEFT AND RIGHT HEART CATHETERIZATION WITH CORONARY ANGIOGRAM N/A 09/30/2014   Procedure: LEFT AND RIGHT HEART CATHETERIZATION WITH CORONARY ANGIOGRAM;  Surgeon: [name], MD;  Location: [facility] CATH LAB;  Service: Cardiovascular;  Laterality: N/A;  . TEE WITHOUT CARDIOVERSION N/A 08/02/2015   Procedure: TRANSESOPHAGEAL ECHOCARDIOGRAM (TEE);  Surgeon: [name], MD;  Location: [place];  Service: Open Heart Surgery;  Laterality: N/A;  . TONSILLECTOMY    . TRANSCATHETER AORTIC VALVE REPLACEMENT, TRANSFEMORAL Left 08/02/2015   Procedure: TRANSCATHETER AORTIC VALVE REPLACEMENT, TRANSFEMORAL;  Surgeon: [name], MD;  Location: [place];  Service: Open Heart Surgery;  Laterality: Left;    Social History: Patient lives with his wife.  The patient walks with a cane.  He is a smoker for 30+ years, quit >20 yaers ago.  From [place] originally, lived most of his life in [place].    Allergies  Allergen Reactions  . Byetta 10 Mcg Pen [Exenatide] Diarrhea and Nausea And Vomiting  . Codeine Itching    Family history: family history includes Diabetes in his father and sister; Heart disease in his father.  Prior to Admission  medications   Medication Sig Start Date End Date Taking? Authorizing Provider  amLODipine (NORVASC) 10 MG tablet Take 10 mg by mouth daily.     Historical Provider, MD  aspirin EC 325 MG tablet Take 1 tablet (325 mg total) by mouth daily. 09/05/15   [name], MD  atorvastatin (LIPITOR) 40 MG tablet Take 1 tablet (40 mg total) by mouth daily at 6 PM. 08/05/15   Bhavinkumar Bhagat, PA  benzonatate (TESSALON) 100 MG capsule Take 1 capsule (100 mg total) by mouth 2 (two) times daily as needed for cough. 01/23/17   Gareth Morgan, MD  calcium carbonate (TUMS - DOSED IN MG ELEMENTAL CALCIUM) 500 MG chewable tablet Chew 1 tablet by mouth 4 (four) times daily as needed for indigestion or heartburn.    Historical Provider, MD  cinacalcet (SENSIPAR) 30 MG tablet Take 30 mg by mouth daily with supper.    Historical Provider, MD  Insulin Detemir (LEVEMIR FLEXTOUCH) 100 UNIT/ML Pen Inject 38 Units into the skin at bedtime.     Historical Provider, MD  lidocaine-prilocaine (EMLA) cream Apply 1 application topically once as  needed (prior to accessing port).     Historical Provider, MD  metoprolol tartrate (LOPRESSOR) 25 MG tablet Take 0.5 tablets (12.5 mg total) by mouth 2 (two) times daily. 08/05/15   Bhavinkumar Bhagat, PA  nitroGLYCERIN (NITROSTAT) 0.4 MG SL tablet Place 0.4 mg under the tongue every 5 (five) minutes as needed for chest pain.     Historical Provider, MD  ondansetron (ZOFRAN ODT) 4 MG disintegrating tablet Take 1 tablet (4 mg total) by mouth every 8 (eight) hours as needed for nausea or vomiting. 01/23/17   Gareth Morgan, MD  ondansetron (ZOFRAN) 8 MG tablet Take 8 mg by mouth daily as needed for nausea or vomiting.    Historical Provider, MD  ranitidine (ZANTAC) 150 MG tablet Take 150 mg by mouth 2 (two) times daily as needed for heartburn.    Historical Provider, MD  sevelamer carbonate (RENVELA) 800 MG tablet Take 2,400 mg by mouth 3 (three) times daily with meals.    Historical Provider, MD        Physical Exam: BP (!) 159/65   Pulse (!) 55   Temp 99.2 F (37.3 C)   Resp 15   Ht 5' 11.5" (1.816 m)   Wt 107 kg (236 lb)   SpO2 95%   BMI 32.46 kg/m  General appearance: Obese elderly ill-appearing adult male, alert and in in mild distress from cough, dyspnea.   Eyes: Anicteric, conjunctiva pink, lids and lashes normal. PERRL.    ENT: No nasal deformity, discharge, epistaxis.  Hearing normal. OP moist without lesions.  Partially edentulous. Neck: No neck masses.  Trachea midline.  No thyromegaly/tenderness. Lymph: No cervical or supraclavicular lymphadenopathy. Skin: Warm and dry.  No jaundice.  No suspicious rashes or lesions. Cardiac: RRR, nl S1-S2, soft SEM.  Capillary refill is brisk.  JV's somewhat distended.  No LE edema.  Radial and DP pulses 2+ and symmetric. Respiratory: Mild tachypnea, can't breathe without coughing.  Wheezes diffusely. Abdomen: Abdomen soft.  No TTP. No ascites, distension, hepatosplenomegaly.   MSK: No deformities or effusions.  No cyanosis or clubbing. Neuro: Cranial nerves normal.  Sensation intact to light touch. Speech is fluent.  Muscle strength normal.    Psych: Sensorium intact and responding to questions, attention normal.  Behavior appropriate.  Affect normal.  Judgment and insight appear normal.     Labs on Admission:  I have personally reviewed following labs and imaging studies: CBC:  Recent Labs Lab 03/09/17 2205  WBC 7.0  HGB 10.3*  HCT 30.6*  MCV 93.3  PLT 128   Basic Metabolic Panel:  Recent Labs Lab 03/09/17 2205  NA 133*  K 4.2  CL 95*  CO2 26  GLUCOSE 143*  BUN 28*  CREATININE 5.35*  CALCIUM 7.9*   GFR: Estimated Creatinine Clearance: 14.3 mL/min (A) (by C-G formula based on SCr of 5.35 mg/dL (H)).  Liver Function Tests:  Recent Labs Lab 03/09/17 2205  AST 21  ALT 15*  ALKPHOS 95  BILITOT 1.0  PROT 7.1  ALBUMIN 3.6    Recent Labs Lab 03/09/17 2205  LIPASE 26   No results for  input(s): AMMONIA in the last 168 hours. Coagulation Profile: No results for input(s): INR, PROTIME in the last 168 hours. Cardiac Enzymes: No results for input(s): CKTOTAL, CKMB, CKMBINDEX, TROPONINI in the last 168 hours. BNP (last 3 results) No results for input(s): PROBNP in the last 8760 hours. HbA1C: No results for input(s): HGBA1C in the last 72 hours. CBG: No results for  input(s): GLUCAP in the last 168 hours. Lipid Profile: No results for input(s): CHOL, HDL, LDLCALC, TRIG, CHOLHDL, LDLDIRECT in the last 72 hours. Thyroid Function Tests: No results for input(s): TSH, T4TOTAL, FREET4, T3FREE, THYROIDAB in the last 72 hours. Anemia Panel: No results for input(s): VITAMINB12, FOLATE, FERRITIN, TIBC, IRON, RETICCTPCT in the last 72 hours. Sepsis Labs: Lactate 0.85 Invalid input(s): PROCALCITONIN, LACTICIDVEN No results found for this or any previous visit (from the past 240 hour(s)).       Radiological Exams on Admission: Personally reviewed CXR shows no pneumonia: Dg Chest Port 1 View  Result Date: 03/09/2017 CLINICAL DATA:  Dyspnea, nausea, vomiting and diarrhea since Wednesday EXAM: PORTABLE CHEST 1 VIEW COMPARISON:  01/23/2017 FINDINGS: Borderline cardiomegaly with aortic valve repair. Aortic atherosclerosis with slight uncoiling of the thoracic aorta. No aneurysm. Right subclavian pacemaker apparatus with leads in the right atrium and right ventricle. Mild bilateral stable hilar fullness likely vascular in etiology. Minimal bibasilar atelectasis without pneumonic consolidation. No effusion or pneumothorax. No acute osseous abnormality. IMPRESSION: Stable cardiomegaly with aortic atherosclerosis. Bibasilar atelectasis without pneumonic consolidation. Mild central vascular congestion. Electronically Signed   By: Ashley Royalty M.D.   On: 03/09/2017 23:22    EKG: Independently reviewed. Rate 55, v-paced.  Echocardiogram 8/17: EF 45-50% Grade I DD  PFT 2016: FEV1 57% FEV1/FVC  68% pred            Assessment/Plan  1. COPD exacerbation:  Has COPD but doesn't know it.   -Solu-Medrol 40 mg IV daily -Albuterol scheduled and when necessary -Check RVP -Check Procalcitonin -Doxycycline IV   2. Hyponatremia:  Mild, hypervolemic.  3. Insulin-dependent diabetes:  -Continue Levemir -High dose SSI while on steroids  4. History of CVA, AVR, CABG, CAD, chronic systolic CHF:  EF 31%. -Continue amlodipine, metoprolol -Continue aspirin, statin  5. ESRD on HD:  Will need HD on Monday. -Continue Sensipar, Renvela -If BNP significantly elevated, discuss extra HD with Nephrology           DVT prophylaxis: Heparin  Code Status: FULL  Family Communication: Wfie, son, daughter at bedside  Disposition Plan: Anticipate steroids, BDs, doxycycline for COPD flare.  Re-eval breathing tomorrow, if still having difficulty breathing with light exertion, make inpatient and continue treatment, otherwise home. Consults called: None Admission status: OBS At the point of initial evaluation, it is my clinical opinion that admission for OBSERVATION is reasonable and necessary because the patient's presenting complaints in the context of their chronic conditions represent sufficient risk of deterioration or significant morbidity to constitute reasonable grounds for close observation in the hospital setting, but that the patient may be medically stable for discharge from the hospital within 24 to 48 hours.    Medical decision making: Patient seen at 1:15 AM on 03/10/2017.  The patient was discussed with Irena Cords, PA-C.  What exists of the patient's chart was reviewed in depth and summarized above.  Clinical condition: stable from hemodynamic standpoint for floor.        Jeffrey [name] [facility] Pager [phone]

## 2017-03-10 NOTE — Consult Note (Signed)
[facility] Renal Consultation Note    Indication for Consultation:  Management of ESRD/hemodialysis; anemia, hypertension/volume and secondary hyperparathyroidism PCP: Thurman Coyer, MD   HPI: Jeffrey [name] is a 76 y.o. 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  He is s/p TAVR, CHB with pacer, hx CVA. There were no reports of any untoward symptoms on his outpt dialysis treatment notes last Wed or Friday but he said he told the staff about his breathing issues.  He dialyzed without incident Friday with a net UF of 2.3 L and attained his EDW of 104 kg.  He was afebrile and BP were in usual controlled range.    According to admission H and P his symptoms started Wednesday with cough, V, D, DOE, wheezing and today his family brought him to the ED because he was "talking out of his head". He was afebrile upon admission. O2 sat was initial 92 % WBC 7  K 4.2 CXR showed CM, mild central vascular congestions.  He tells me his vomiting was induced by coughing so much. He thinks some of his symptoms have been induced by being under the cold air duct and cold temperatures a his dialysis unit.   He had SSCP associated with coughing so much he thought he was having a heart attack.  He is coughing only mucous.  Has more coughing when he lies flat.  The breathing treatments have helped.  He could "scarcely catch his breath when he came in."  He is eating ok. He denies diarrhea.  He quit smoking in 1985 and used to do long distance trucking.    Past Medical History:  Diagnosis Date  . Anginal pain ([place])   . Aortic stenosis    a. severe by echo 09/2014  . Atrial fibrillation ([place])    a. not well documented, not on anticoagulation  . CHF (congestive heart failure) ([place])   . Claustrophobia   . Complete heart block ([place])   . Coronary artery disease    a. chronically occluded RCA per cath 09/2014 with collaterals   . CVA (cerebral vascular accident) ([place]) 10/2014   denies residual on 07/11/2015  . ESRD (end stage renal disease) on dialysis [facility])    a. on dialysis; [facility]; MWF, LUE fistula (07/11/2015)  . History of blood transfusion    "related to gallbladder OR"  . History of stomach ulcers   . Hyperlipidemia   . Hypertension   . Iron deficiency anemia   . Myocardial infarction ([place]) 10/2014  . Peripheral vascular disease ([place])   . Pneumonia   . Presence of permanent cardiac pacemaker   . Renal insufficiency   . 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 Type II diabetes mellitus ([place])    Past Surgical History:  Procedure Laterality Date  . AV FISTULA PLACEMENT Left 10/19/2014   Procedure: BRACHIOCEPHALIC ARTERIOVENOUS (AV) FISTULA CREATION ;  Surgeon: Conrad Lushton, MD;  Location: [facility];  Service: Vascular;  Laterality: Left;  . CARDIAC CATHETERIZATION    . CARDIAC CATHETERIZATION N/A 07/22/2015   Procedure: Right/Left Heart Cath and Coronary Angiography;  Surgeon: [name], MD;  Location: [facility] CV LAB;  Service: Cardiovascular;  Laterality: N/A;  . CATARACT EXTRACTION W/ INTRAOCULAR LENS  IMPLANT, BILATERAL Bilateral 1990's  .  CHOLECYSTECTOMY OPEN  1980's  . COLONOSCOPY W/ BIOPSIES AND POLYPECTOMY    . CORONARY ANGIOPLASTY    . EP IMPLANTABLE DEVICE N/A 07/11/2015   Procedure: Pacemaker Implant;  Surgeon: [name], MD;  Location: [facility] CV LAB;  Service: Cardiovascular;  Laterality: N/A;  . ESOPHAGOGASTRODUODENOSCOPY  08/01/2012   Procedure: ESOPHAGOGASTRODUODENOSCOPY (EGD);  Surgeon: [name], MD;  Location: [facility];  Service: Endoscopy;  Laterality: N/A;  . INSERT / REPLACE / REMOVE PACEMAKER  07/11/2015  . INSERTION OF DIALYSIS CATHETER Right 02/02/2015   Procedure: INSERTION OF DIALYSIS CATHETER  RIGHT INTERNAL JUGULAR;   Surgeon: Mal [name], MD;  Location: [facility];  Service: Vascular;  Laterality: Right;  . LEFT AND RIGHT HEART CATHETERIZATION WITH CORONARY ANGIOGRAM N/A 09/30/2014   Procedure: LEFT AND RIGHT HEART CATHETERIZATION WITH CORONARY ANGIOGRAM;  Surgeon: [name], MD;  Location: [facility] CATH LAB;  Service: Cardiovascular;  Laterality: N/A;  . TEE WITHOUT CARDIOVERSION N/A 08/02/2015   Procedure: TRANSESOPHAGEAL ECHOCARDIOGRAM (TEE);  Surgeon: [name], MD;  Location: [facility];  Service: Open Heart Surgery;  Laterality: N/A;  . TONSILLECTOMY    . TRANSCATHETER AORTIC VALVE REPLACEMENT, TRANSFEMORAL Left 08/02/2015   Procedure: TRANSCATHETER AORTIC VALVE REPLACEMENT, TRANSFEMORAL;  Surgeon: [name], MD;  Location: [facility];  Service: Open Heart Surgery;  Laterality: Left;   Family History  Problem Relation Age of Onset  . Diabetes Father   . Heart disease Father   . Diabetes Sister   . Alzheimer's disease Mother    Social History:  reports that he quit smoking about 33 years ago. He has a 64.00 pack-year smoking history. He has never used smokeless tobacco. He reports that he drinks alcohol. He reports that he does not use drugs. Allergies  Allergen Reactions  . Byetta 10 Mcg Pen [Exenatide] Diarrhea and Nausea And Vomiting  . Codeine Itching   Prior to Admission medications   Medication Sig Start Date End Date Taking? Authorizing Provider  amLODipine (NORVASC) 10 MG tablet Take 10 mg by mouth daily.    Yes Historical Provider, MD  aspirin EC 325 MG tablet Take 1 tablet (325 mg total) by mouth daily. 09/05/15  Yes [name], MD  atorvastatin (LIPITOR) 40 MG tablet Take 1 tablet (40 mg total) by mouth daily at 6 PM. 08/05/15  Yes Bhavinkumar Bhagat, PA  benzonatate (TESSALON) 100 MG capsule Take 1 capsule (100 mg total) by mouth 2 (two) times daily as needed for cough. 01/23/17  Yes Gareth Morgan, MD  calcium carbonate (TUMS - DOSED IN MG ELEMENTAL CALCIUM) 500 MG chewable tablet Chew 1  tablet by mouth 4 (four) times daily as needed for indigestion or heartburn.   Yes Historical Provider, MD  cinacalcet (SENSIPAR) 30 MG tablet Take 30 mg by mouth daily with supper.   Yes Historical Provider, MD  Insulin Detemir (LEVEMIR FLEXTOUCH) 100 UNIT/ML Pen Inject 36 Units into the skin at bedtime.    Yes Historical Provider, MD  lidocaine-prilocaine (EMLA) cream Apply 1 application topically once as needed (prior to accessing port).    Yes Historical Provider, MD  metoprolol tartrate (LOPRESSOR) 25 MG tablet Take 0.5 tablets (12.5 mg total) by mouth 2 (two) times daily. 08/05/15  Yes Bhavinkumar Bhagat, PA  nitroGLYCERIN (NITROSTAT) 0.4 MG SL tablet Place 0.4 mg under the tongue every 5 (five) minutes as needed for chest pain.    Yes Historical Provider, MD  ondansetron (ZOFRAN ODT) 4 MG disintegrating tablet Take 1 tablet (4 mg  total) by mouth every 8 (eight) hours as needed for nausea or vomiting. 01/23/17  Yes Gareth Morgan, MD  ondansetron (ZOFRAN) 8 MG tablet Take 8 mg by mouth daily as needed for nausea or vomiting.   Yes Historical Provider, MD  ranitidine (ZANTAC) 150 MG tablet Take 150 mg by mouth 2 (two) times daily as needed for heartburn.   Yes Historical Provider, MD  sevelamer carbonate (RENVELA) 800 MG tablet Take 2,400-3,200 mg by mouth See admin instructions. Take 4 capsules with meals and 3 with snacks   Yes Historical Provider, MD   Current Facility-Administered Medications  Medication Dose Route Frequency Provider Last Rate Last Dose  . acetaminophen (TYLENOL) tablet 650 mg  650 mg Oral Q6H PRN Edwin Dada, MD       Or  . acetaminophen (TYLENOL) suppository 650 mg  650 mg Rectal Q6H PRN Edwin Dada, MD      . albuterol (PROVENTIL) (2.5 MG/3ML) 0.083% nebulizer solution 2.5 mg  2.5 mg Nebulization Q2H PRN Edwin Dada, MD      . amLODipine (NORVASC) tablet 10 mg  10 mg Oral Daily Edwin Dada, MD      . aspirin EC tablet 325 mg  325 mg  Oral Daily Edwin Dada, MD   325 mg at 03/10/17 0900  . atorvastatin (LIPITOR) tablet 40 mg  40 mg Oral q1800 Edwin Dada, MD      . benzonatate (TESSALON) capsule 100 mg  100 mg Oral BID PRN Edwin Dada, MD   100 mg at 03/10/17 0329  . cinacalcet (SENSIPAR) tablet 30 mg  30 mg Oral Q supper Edwin Dada, MD      . doxycycline (VIBRAMYCIN) 100 mg in dextrose 5 % 250 mL IVPB  100 mg Intravenous Q12H Edwin Dada, MD   100 mg at 03/10/17 0330  . famotidine (PEPCID) tablet 10 mg  10 mg Oral QHS Edwin Dada, MD      . heparin injection 5,000 Units  5,000 Units Subcutaneous Q8H Edwin Dada, MD   5,000 Units at 03/10/17 [phone]  . insulin aspart (novoLOG) injection 0-20 Units  0-20 Units Subcutaneous TID WC Edwin Dada, MD   15 Units at 03/10/17 0858  . insulin aspart (novoLOG) injection 10 Units  10 Units Subcutaneous TID WC Clanford L Johnson, MD      . insulin detemir (LEVEMIR) injection 45 Units  45 Units Subcutaneous QHS Clanford L Johnson, MD      . ipratropium-albuterol (DUONEB) 0.5-2.5 (3) MG/3ML nebulizer solution 3 mL  3 mL Nebulization Q6H Clanford L Johnson, MD      . methylPREDNISolone sodium succinate (SOLU-MEDROL) 40 mg/mL injection 40 mg  40 mg Intravenous Q12H Clanford L Johnson, MD      . metoprolol tartrate (LOPRESSOR) tablet 12.5 mg  12.5 mg Oral BID Edwin Dada, MD      . ondansetron (ZOFRAN) tablet 4 mg  4 mg Oral Q6H PRN Edwin Dada, MD       Or  . ondansetron (ZOFRAN) injection 4 mg  4 mg Intravenous Q6H PRN Edwin Dada, MD      . ondansetron (ZOFRAN-ODT) 4 MG disintegrating tablet           . sevelamer carbonate (RENVELA) tablet 2,400 mg  2,400 mg Oral TID WC Edwin Dada, MD   2,400 mg at 03/10/17 0859   Labs: Basic Metabolic Panel:  Recent Labs Lab 03/09/17 2205 03/10/17  0403  NA 133* 132*  K 4.2 3.2*  CL 95* 93*  CO2 26 24  GLUCOSE 143* 330*  BUN 28*  32*  CREATININE 5.35* 5.83*  CALCIUM 7.9* 7.4*   Liver Function Tests:  Recent Labs Lab 03/09/17 2205  AST 21  ALT 15*  ALKPHOS 95  BILITOT 1.0  PROT 7.1  ALBUMIN 3.6    Recent Labs Lab 03/09/17 2205  LIPASE 26   CBC:  Recent Labs Lab 03/09/17 2205 03/10/17 0403  WBC 7.0 6.2  HGB 10.3* 9.4*  HCT 30.6* 28.5*  MCV 93.3 94.4  PLT 205 165    CBG:  Recent Labs Lab 03/10/17 0208 03/10/17 0821  GLUCAP 206* 332*   Studies/Results: Dg Chest Port 1 View  Result Date: 03/09/2017 CLINICAL DATA:  Dyspnea, nausea, vomiting and diarrhea since Wednesday EXAM: PORTABLE CHEST 1 VIEW COMPARISON:  01/23/2017 FINDINGS: Borderline cardiomegaly with aortic valve repair. Aortic atherosclerosis with slight uncoiling of the thoracic aorta. No aneurysm. Right subclavian pacemaker apparatus with leads in the right atrium and right ventricle. Mild bilateral stable hilar fullness likely vascular in etiology. Minimal bibasilar atelectasis without pneumonic consolidation. No effusion or pneumothorax. No acute osseous abnormality. IMPRESSION: Stable cardiomegaly with aortic atherosclerosis. Bibasilar atelectasis without pneumonic consolidation. Mild central vascular congestion. Electronically Signed   By: Ashley Royalty M.D.   On: 03/09/2017 23:22    ROS: As per HPI otherwise negative.  Physical Exam: Vitals:   03/09/17 2330 03/10/17 0208 03/10/17 0532 03/10/17 0849  BP: (!) 120/45 (!) 119/36 (!) 119/36   Pulse: (!) 55 (!) 48 (!) 54   Resp: 15 20 19    Temp:  97.8 F (36.6 C) 98.9 F (37.2 C)   TempSrc:  Oral Oral   SpO2: 98% 99% 92% 94%  Weight:  103.9 kg (229 lb)    Height:         General: elderly WM NAD breathing easily on room air Head: NCAT sclera not icteric MMM Neck: Supple. No JVD Lungs: bilateral wheezes - some crackles right base; coughs with deep inspiration Heart: RRR with S1 S2.  Abdomen: soft NT + BS - large RUQ scar from prior GB surgery Lower extremities:without  edema or ischemic changes, no open wounds  Neuro: A & O  X 3. Moves all extremities spontaneously. Psych:  Responds to questions appropriately with a normal affect. Dialysis Access:  Left upper AVF + bruit  Dialysis Orders: MWF NW 4 hr 400/800 EDW 104 2 K 2.5 Ca left upper AVF no heparin Hectorol 2 Mircera 75 q 4 weeks - last 4/4 resumed - had been off since 1/24   Recent labs: hgb 10.8 stable 40% sat ipTH 148 Ca/P ok  On 4 renvela and sensipar 30  Assessment/Plan: 1. Exacerbation of COPD influenza neg - resp panel in process- steroid/nebs/doxycyline per primary- improving  2. ESRD -  MWF - HD tomorrow K 3.2 - start on 4 K bath 3. Hypertension/volume  - BP controlled- Med Rx records indicate he is on amlodipine 10 and MTP 12.5 bid (needs updated med list after d/c since amlodipine is not on actual med list; may need EDW down a tad 4. Anemia  -hgb 10.4 - down to 9.4 - if continues downward trend would redose on Wed on 2 week interval instead of 4 week- no ESA for now 5. Metabolic bone disease -  Continue hectorol/binders Ca on the low side - needs 2.5 Ca bath but Ca is low - hold sensipar 30 mg  for now 6. Nutrition - heart healthy diet ok because K is low - add multivit 7. DM - sugars up likely from steroids- per primary 8. ASCVD hx TAVR, hx CVA, CABG; EF 45-50% 06/2016  Myriam Jacobson, PA-C [facility] Beeper [phone] 03/10/2017, 10:11 AM   Renal Attending: I agree with note as articulated bove. Acquanetta Cabanilla C

## 2017-03-10 NOTE — ED Notes (Signed)
Pt adamantly refusing in and out catheter.  Has attempted urine sample via urinal.  States he will obtain urine this way but we "have to give him a little time".

## 2017-03-10 NOTE — Progress Notes (Signed)
03/09/2017 10:08 PM  03/10/2017 9:39 AM  Jeffrey [name] was seen and examined.  The H&P by the admitting provider , orders, imaging was reviewed.  Please see orders.  Will continue to follow.   Murvin Natal, MD [facility]

## 2017-03-11 DIAGNOSIS — I132 Hypertensive heart and chronic kidney disease with heart failure and with stage 5 chronic kidney disease, or end stage renal disease: Secondary | ICD-10-CM | POA: Diagnosis present

## 2017-03-11 DIAGNOSIS — R05 Cough: Secondary | ICD-10-CM | POA: Diagnosis present

## 2017-03-11 DIAGNOSIS — Z950 Presence of cardiac pacemaker: Secondary | ICD-10-CM | POA: Diagnosis not present

## 2017-03-11 DIAGNOSIS — E1122 Type 2 diabetes mellitus with diabetic chronic kidney disease: Secondary | ICD-10-CM | POA: Diagnosis present

## 2017-03-11 DIAGNOSIS — E1165 Type 2 diabetes mellitus with hyperglycemia: Secondary | ICD-10-CM | POA: Diagnosis present

## 2017-03-11 DIAGNOSIS — Z992 Dependence on renal dialysis: Secondary | ICD-10-CM | POA: Diagnosis not present

## 2017-03-11 DIAGNOSIS — E871 Hypo-osmolality and hyponatremia: Secondary | ICD-10-CM | POA: Diagnosis present

## 2017-03-11 DIAGNOSIS — Z951 Presence of aortocoronary bypass graft: Secondary | ICD-10-CM | POA: Diagnosis not present

## 2017-03-11 DIAGNOSIS — Z87891 Personal history of nicotine dependence: Secondary | ICD-10-CM | POA: Diagnosis not present

## 2017-03-11 DIAGNOSIS — I10 Essential (primary) hypertension: Secondary | ICD-10-CM | POA: Diagnosis not present

## 2017-03-11 DIAGNOSIS — Z95810 Presence of automatic (implantable) cardiac defibrillator: Secondary | ICD-10-CM | POA: Diagnosis not present

## 2017-03-11 DIAGNOSIS — I252 Old myocardial infarction: Secondary | ICD-10-CM | POA: Diagnosis not present

## 2017-03-11 DIAGNOSIS — I5022 Chronic systolic (congestive) heart failure: Secondary | ICD-10-CM | POA: Diagnosis present

## 2017-03-11 DIAGNOSIS — E785 Hyperlipidemia, unspecified: Secondary | ICD-10-CM | POA: Diagnosis present

## 2017-03-11 DIAGNOSIS — J9601 Acute respiratory failure with hypoxia: Secondary | ICD-10-CM | POA: Diagnosis not present

## 2017-03-11 DIAGNOSIS — I2510 Atherosclerotic heart disease of native coronary artery without angina pectoris: Secondary | ICD-10-CM | POA: Diagnosis present

## 2017-03-11 DIAGNOSIS — N2581 Secondary hyperparathyroidism of renal origin: Secondary | ICD-10-CM | POA: Diagnosis present

## 2017-03-11 DIAGNOSIS — Z953 Presence of xenogenic heart valve: Secondary | ICD-10-CM | POA: Diagnosis not present

## 2017-03-11 DIAGNOSIS — D631 Anemia in chronic kidney disease: Secondary | ICD-10-CM | POA: Diagnosis present

## 2017-03-11 DIAGNOSIS — Z794 Long term (current) use of insulin: Secondary | ICD-10-CM | POA: Diagnosis not present

## 2017-03-11 DIAGNOSIS — R0603 Acute respiratory distress: Secondary | ICD-10-CM | POA: Diagnosis present

## 2017-03-11 DIAGNOSIS — Z8673 Personal history of transient ischemic attack (TIA), and cerebral infarction without residual deficits: Secondary | ICD-10-CM | POA: Diagnosis not present

## 2017-03-11 DIAGNOSIS — N186 End stage renal disease: Secondary | ICD-10-CM | POA: Diagnosis present

## 2017-03-11 DIAGNOSIS — Z9861 Coronary angioplasty status: Secondary | ICD-10-CM | POA: Diagnosis not present

## 2017-03-11 DIAGNOSIS — I25119 Atherosclerotic heart disease of native coronary artery with unspecified angina pectoris: Secondary | ICD-10-CM | POA: Diagnosis present

## 2017-03-11 DIAGNOSIS — E1151 Type 2 diabetes mellitus with diabetic peripheral angiopathy without gangrene: Secondary | ICD-10-CM | POA: Diagnosis present

## 2017-03-11 DIAGNOSIS — Z952 Presence of prosthetic heart valve: Secondary | ICD-10-CM | POA: Diagnosis not present

## 2017-03-11 DIAGNOSIS — J441 Chronic obstructive pulmonary disease with (acute) exacerbation: Secondary | ICD-10-CM | POA: Diagnosis present

## 2017-03-11 DIAGNOSIS — M6282 Rhabdomyolysis: Secondary | ICD-10-CM | POA: Diagnosis present

## 2017-03-11 LAB — CBC
HEMATOCRIT: 25.9 % — AB (ref 39.0–52.0)
HEMOGLOBIN: 9.2 g/dL — AB (ref 13.0–17.0)
MCH: 32.3 pg (ref 26.0–34.0)
MCHC: 35.5 g/dL (ref 30.0–36.0)
MCV: 90.9 fL (ref 78.0–100.0)
Platelets: 160 10*3/uL (ref 150–400)
RBC: 2.85 MIL/uL — ABNORMAL LOW (ref 4.22–5.81)
RDW: 13.5 % (ref 11.5–15.5)
WBC: 5.2 10*3/uL (ref 4.0–10.5)

## 2017-03-11 LAB — GLUCOSE, CAPILLARY
GLUCOSE-CAPILLARY: 278 mg/dL — AB (ref 65–99)
GLUCOSE-CAPILLARY: 189 mg/dL — AB (ref 65–99)
GLUCOSE-CAPILLARY: 304 mg/dL — AB (ref 65–99)
Glucose-Capillary: 324 mg/dL — ABNORMAL HIGH (ref 65–99)

## 2017-03-11 LAB — RENAL FUNCTION PANEL
ANION GAP: 13 (ref 5–15)
Albumin: 3.4 g/dL — ABNORMAL LOW (ref 3.5–5.0)
BUN: 48 mg/dL — AB (ref 6–20)
CHLORIDE: 93 mmol/L — AB (ref 101–111)
CO2: 23 mmol/L (ref 22–32)
Calcium: 7.3 mg/dL — ABNORMAL LOW (ref 8.9–10.3)
Creatinine, Ser: 6.96 mg/dL — ABNORMAL HIGH (ref 0.61–1.24)
GFR calc Af Amer: 8 mL/min — ABNORMAL LOW (ref >60)
GFR calc non Af Amer: 7 mL/min — ABNORMAL LOW (ref >60)
GLUCOSE: 220 mg/dL — AB (ref 65–99)
POTASSIUM: 4.3 mmol/L (ref 3.5–5.1)
Phosphorus: 4.8 mg/dL — ABNORMAL HIGH (ref 2.5–4.6)
Sodium: 129 mmol/L — ABNORMAL LOW (ref 135–145)

## 2017-03-11 MED ORDER — LIDOCAINE-PRILOCAINE 2.5-2.5 % EX CREA: 1.0000 "application " | TOPICAL_CREAM | CUTANEOUS | Status: DC | PRN

## 2017-03-11 MED ORDER — SODIUM CHLORIDE 0.9 % IV SOLN: 100.0000 mL | INTRAVENOUS | Status: DC | PRN

## 2017-03-11 MED ORDER — LIDOCAINE HCL (PF) 1 % IJ SOLN: 5.0000 mL | INTRAMUSCULAR | Status: DC | PRN

## 2017-03-11 MED ORDER — IPRATROPIUM-ALBUTEROL 0.5-2.5 (3) MG/3ML IN SOLN
3.0000 mL | RESPIRATORY_TRACT | Status: DC
  Administered 2017-03-11 – 2017-03-13 (×10): 3 mL via RESPIRATORY_TRACT
  Filled 2017-03-11 (×10): qty 3

## 2017-03-11 MED ORDER — INSULIN ASPART 100 UNIT/ML ~~LOC~~ SOLN
18.0000 [IU] | Freq: Three times a day (TID) | SUBCUTANEOUS | Status: DC
Start: 2017-03-11 — End: 2017-03-12
  Administered 2017-03-11 – 2017-03-12 (×3): 18 [IU] via SUBCUTANEOUS

## 2017-03-11 MED ORDER — DOXERCALCIFEROL 4 MCG/2ML IV SOLN
INTRAVENOUS | Status: AC
  Filled 2017-03-11: qty 2

## 2017-03-11 MED ORDER — ALTEPLASE 2 MG IJ SOLR: 2.0000 mg | Freq: Once | INTRAMUSCULAR | Status: DC | PRN

## 2017-03-11 MED ORDER — PENTAFLUOROPROP-TETRAFLUOROETH EX AERO: 1.0000 "application " | INHALATION_SPRAY | CUTANEOUS | Status: DC | PRN

## 2017-03-11 MED ORDER — INSULIN DETEMIR 100 UNIT/ML ~~LOC~~ SOLN
50.0000 [IU] | Freq: Every day | SUBCUTANEOUS | Status: DC
  Administered 2017-03-11: 50 [IU] via SUBCUTANEOUS
  Filled 2017-03-11: qty 0.5

## 2017-03-11 MED ORDER — HEPARIN SODIUM (PORCINE) 1000 UNIT/ML DIALYSIS: 1000.0000 [IU] | INTRAMUSCULAR | Status: DC | PRN

## 2017-03-11 NOTE — Progress Notes (Signed)
Acute respiratory distress ruled in Acute respiratory failure ruled out

## 2017-03-11 NOTE — Procedures (Signed)
Pt here with COPD flare, on OBS status.  Flu neg by swab, on steroids/ nebs/ doxy po.  Improving. HD today, UF to dry wt.    I was present at this dialysis session, have reviewed the session itself and made  appropriate changes Kelly Splinter MD [place] pager [phone]   03/11/2017, 11:14 AM

## 2017-03-11 NOTE — Progress Notes (Signed)
PROGRESS NOTE   Jeffrey [name]  FUX:[id number]  DOB: [birth year]  DOA: 03/09/2017 PCP: Thurman Coyer, MD  Hospital course: Jeffrey [name] is a 76 y.o. male with a past medical history significant for ESRD on HD MWF, AS s/p TAVR, CHB with pacer, and IDDM who presents with worsening dyspnea, cough, and malaise.  He was admitted with COPD exacerbation.    Assessment & Plan:    1. COPD exacerbation Pt continues to struggle, severe cough and wheezing.  He says that he has not been receiving the scheduled nebs as ordered or the PRN nebs.  He struggled to breath most of the night.  He needs ongoing acute hospital care for treatment. He needs ongoing IV solumedrol.  Scheduled nebs need to be given, will intensify the frequency of treatments, continue antibiotics.  Continue to monitor closely.  Repeat CXR in AM.    2. Hyponatremia:  Mild, hypervolemic.  Pt receiving HD treatment 4/16.    3. Insulin-dependent diabetes:  - Uncontrolled blood sugars on steroids, will intensify therapy while on steroids, increase lantus to 50 units and increase prandial coverage to 18 units TID AC of novolog plus supplemental sliding scale coverage.    4. History of CVA, AVR, CABG, CAD, chronic systolic CHF:  EF 02%. -Continue amlodipine, metoprolol -Continue aspirin, statin  5. ESRD on HD:  Nephrology following for HD needs, HD treatment received 4/16 in hospital.  DVT prophylaxis: Heparin  Code Status: FULL  Family Communication: Wife, son, daughter  Disposition Plan: Home  Consults called: nephrology  Subjective: Pt reports that he hasn't been receiving neb treatments since I saw him yesterday morning,  He is coughing and short of breath with significant wheezing, was seen in HD today  Objective: Vitals:   03/11/17 1000 03/11/17 1029 03/11/17 1100 03/11/17 1107  BP: (!) 110/48 (!) 103/46 (!) 122/39 (!) 131/44  Pulse: (!) 47 (!) 55 (!) 54 (!) 54  Resp:    16  Temp:    97.8 F (36.6 C)    TempSrc:    Oral  SpO2:    95%  Weight:    103.8 kg (228 lb 13.4 oz)  Height:        Intake/Output Summary (Last 24 hours) at 03/11/17 1141 Last data filed at 03/11/17 1107  Gross per 24 hour  Intake             1100 ml  Output             3500 ml  Net            -2400 ml   Filed Weights   03/10/17 0208 03/11/17 0700 03/11/17 1107  Weight: 103.9 kg (229 lb) 107.3 kg (236 lb 8.9 oz) 103.8 kg (228 lb 13.4 oz)    Exam:  General exam: awake, alert cooperative seems frustrated Respiratory system: diffuse upper airway congestion noises, diffuse insp/exp wheezing bilateral Cardiovascular system: S1 & S2 heard.  Gastrointestinal system: Abdomen is nondistended, soft and nontender. Normal bowel sounds heard. Central nervous system: Alert and oriented. No focal neurological deficits. Extremities: no Cyanosis.   Data Reviewed: Basic Metabolic Panel:  Recent Labs Lab 03/09/17 2205 03/10/17 0403 03/10/17 1947 03/11/17 0724  NA 133* 132* 127* 129*  K 4.2 3.2* 3.6 4.3  CL 95* 93* 88* 93*  CO2 26 24 24 23   GLUCOSE 143* 330* 253* 220*  BUN 28* 32* 51* 48*  CREATININE 5.35* 5.83* 6.61* 6.96*  CALCIUM 7.9* 7.4* 7.4* 7.3*  PHOS  --   --  4.0 4.8*   Liver Function Tests:  Recent Labs Lab 03/09/17 2205 03/10/17 1947 03/11/17 0724  AST 21  --   --   ALT 15*  --   --   ALKPHOS 95  --   --   BILITOT 1.0  --   --   PROT 7.1  --   --   ALBUMIN 3.6 3.5 3.4*    Recent Labs Lab 03/09/17 2205  LIPASE 26   No results for input(s): AMMONIA in the last 168 hours. CBC:  Recent Labs Lab 03/09/17 2205 03/10/17 0403 03/11/17 0724  WBC 7.0 6.2 5.2  HGB 10.3* 9.4* 9.2*  HCT 30.6* 28.5* 25.9*  MCV 93.3 94.4 90.9  PLT 205 165 160   Cardiac Enzymes: No results for input(s): CKTOTAL, CKMB, CKMBINDEX, TROPONINI in the last 168 hours. CBG (last 3)   Recent Labs  03/10/17 1619 03/10/17 2304 03/11/17 0336  GLUCAP 197* 288* 278*   Recent Results (from the past 240 hour(s))   Respiratory Panel by PCR     Status: Abnormal   Collection Time: 03/09/17 11:13 PM  Result Value Ref Range Status   Adenovirus NOT DETECTED NOT DETECTED Final   Coronavirus 229E NOT DETECTED NOT DETECTED Final   Coronavirus HKU1 NOT DETECTED NOT DETECTED Final   Coronavirus NL63 NOT DETECTED NOT DETECTED Final   Coronavirus OC43 NOT DETECTED NOT DETECTED Final   Metapneumovirus NOT DETECTED NOT DETECTED Final   Rhinovirus / Enterovirus NOT DETECTED NOT DETECTED Final   Influenza A NOT DETECTED NOT DETECTED Final   Influenza B NOT DETECTED NOT DETECTED Final   Parainfluenza Virus 1 NOT DETECTED NOT DETECTED Final   Parainfluenza Virus 2 NOT DETECTED NOT DETECTED Final   Parainfluenza Virus 3 DETECTED (A) NOT DETECTED Final   Parainfluenza Virus 4 NOT DETECTED NOT DETECTED Final   Respiratory Syncytial Virus NOT DETECTED NOT DETECTED Final   Bordetella pertussis NOT DETECTED NOT DETECTED Final   Chlamydophila pneumoniae NOT DETECTED NOT DETECTED Final   Mycoplasma pneumoniae NOT DETECTED NOT DETECTED Final  MRSA PCR Screening     Status: None   Collection Time: 03/10/17  2:00 PM  Result Value Ref Range Status   MRSA by PCR NEGATIVE NEGATIVE Final    Comment:        The GeneXpert MRSA Assay (FDA approved for NASAL specimens only), is one component of a comprehensive MRSA colonization surveillance program. It is not intended to diagnose MRSA infection nor to guide or monitor treatment for MRSA infections.      Studies: Dg Chest Port 1 View  Result Date: 03/09/2017 CLINICAL DATA:  Dyspnea, nausea, vomiting and diarrhea since Wednesday EXAM: PORTABLE CHEST 1 VIEW COMPARISON:  01/23/2017 FINDINGS: Borderline cardiomegaly with aortic valve repair. Aortic atherosclerosis with slight uncoiling of the thoracic aorta. No aneurysm. Right subclavian pacemaker apparatus with leads in the right atrium and right ventricle. Mild bilateral stable hilar fullness likely vascular in etiology.  Minimal bibasilar atelectasis without pneumonic consolidation. No effusion or pneumothorax. No acute osseous abnormality. IMPRESSION: Stable cardiomegaly with aortic atherosclerosis. Bibasilar atelectasis without pneumonic consolidation. Mild central vascular congestion. Electronically Signed   By: Ashley Royalty M.D.   On: 03/09/2017 23:22   Scheduled Meds: . amLODipine  10 mg Oral Daily  . aspirin EC  325 mg Oral Daily  . atorvastatin  40 mg Oral q1800  . doxercalciferol  2 mcg Intravenous Q M,W,F-HD  . doxycycline (VIBRAMYCIN) IV  100 mg Intravenous Q12H  . famotidine  10 mg Oral QHS  . heparin  5,000 Units Subcutaneous Q8H  . insulin aspart  0-20 Units Subcutaneous TID WC  . insulin aspart  18 Units Subcutaneous TID WC  . insulin detemir  50 Units Subcutaneous QHS  . ipratropium-albuterol  3 mL Nebulization Q4H  . methylPREDNISolone (SOLU-MEDROL) injection  40 mg Intravenous Q12H  . metoprolol tartrate  12.5 mg Oral BID  . multivitamin  1 tablet Oral QHS  . sevelamer carbonate  2,400 mg Oral TID WC   Continuous Infusions:  Principal Problem:   COPD with acute exacerbation (HCC) Active Problems:   Type 2 diabetes mellitus with chronic kidney disease on chronic dialysis, with long-term current use of insulin (HCC)   Essential hypertension   ESRD (end stage renal disease) on dialysis [facility])   Atherosclerosis of native coronary artery of native heart with angina pectoris (HCC)   Acute respiratory failure (HCC)   S/P TAVR (transcatheter aortic valve replacement)   Absolute anemia   Automatic implantable cardioverter-defibrillator in situ   Hyponatremia  Time spent:   Irwin Brakeman, MD, FAAFP [facility] Pager [phone]  If 7PM-7AM, please contact night-coverage [email or web address] Password [email or web address] 03/11/2017, 11:41 AM    LOS: 0 days

## 2017-03-11 NOTE — Progress Notes (Signed)
Anemia associated with chronic kidney disease

## 2017-03-11 NOTE — Progress Notes (Signed)
Type 2 diabetes with hyperglycemia

## 2017-03-12 ENCOUNTER — Inpatient Hospital Stay (HOSPITAL_COMMUNITY): Payer: Medicare Other

## 2017-03-12 LAB — RENAL FUNCTION PANEL
ALBUMIN: 3.5 g/dL (ref 3.5–5.0)
Anion gap: 16 — ABNORMAL HIGH (ref 5–15)
BUN: 33 mg/dL — AB (ref 6–20)
CALCIUM: 8 mg/dL — AB (ref 8.9–10.3)
CO2: 24 mmol/L (ref 22–32)
Chloride: 93 mmol/L — ABNORMAL LOW (ref 101–111)
Creatinine, Ser: 4.83 mg/dL — ABNORMAL HIGH (ref 0.61–1.24)
GFR calc Af Amer: 12 mL/min — ABNORMAL LOW (ref >60)
GFR, EST NON AFRICAN AMERICAN: 10 mL/min — AB (ref >60)
GLUCOSE: 270 mg/dL — AB (ref 65–99)
PHOSPHORUS: 3 mg/dL (ref 2.5–4.6)
POTASSIUM: 3.7 mmol/L (ref 3.5–5.1)
SODIUM: 133 mmol/L — AB (ref 135–145)

## 2017-03-12 LAB — GLUCOSE, CAPILLARY
GLUCOSE-CAPILLARY: 234 mg/dL — AB (ref 65–99)
Glucose-Capillary: 291 mg/dL — ABNORMAL HIGH (ref 65–99)
Glucose-Capillary: 219 mg/dL — ABNORMAL HIGH (ref 65–99)
Glucose-Capillary: 158 mg/dL — ABNORMAL HIGH (ref 65–99)

## 2017-03-12 MED ORDER — SEVELAMER CARBONATE 800 MG PO TABS
1600.0000 mg | ORAL_TABLET | Freq: Three times a day (TID) | ORAL | Status: DC
  Administered 2017-03-12 – 2017-03-14 (×5): 1600 mg via ORAL
  Filled 2017-03-12 (×5): qty 2

## 2017-03-12 MED ORDER — GUAIFENESIN ER 600 MG PO TB12
600.0000 mg | ORAL_TABLET | Freq: Two times a day (BID) | ORAL | Status: DC
  Administered 2017-03-12 – 2017-03-14 (×4): 600 mg via ORAL
  Filled 2017-03-12 (×4): qty 1

## 2017-03-12 MED ORDER — INSULIN DETEMIR 100 UNIT/ML ~~LOC~~ SOLN
55.0000 [IU] | Freq: Every day | SUBCUTANEOUS | Status: DC
  Administered 2017-03-12 – 2017-03-13 (×2): 55 [IU] via SUBCUTANEOUS
  Filled 2017-03-12 (×2): qty 0.55

## 2017-03-12 MED ORDER — INSULIN ASPART 100 UNIT/ML ~~LOC~~ SOLN
20.0000 [IU] | Freq: Three times a day (TID) | SUBCUTANEOUS | Status: DC
  Administered 2017-03-12 – 2017-03-14 (×5): 20 [IU] via SUBCUTANEOUS

## 2017-03-12 MED ORDER — DEXTROMETHORPHAN POLISTIREX ER 30 MG/5ML PO SUER
30.0000 mg | Freq: Two times a day (BID) | ORAL | Status: DC | PRN
Start: 2017-03-12 — End: 2017-03-14
  Filled 2017-03-12: qty 5

## 2017-03-12 MED ORDER — METHYLPREDNISOLONE SODIUM SUCC 40 MG IJ SOLR
40.0000 mg | Freq: Two times a day (BID) | INTRAMUSCULAR | Status: DC
  Administered 2017-03-12 – 2017-03-13 (×3): 40 mg via INTRAVENOUS
  Filled 2017-03-12 (×3): qty 1

## 2017-03-12 MED ORDER — DARBEPOETIN ALFA 60 MCG/0.3ML IJ SOSY
60.0000 ug | PREFILLED_SYRINGE | INTRAMUSCULAR | Status: DC
  Administered 2017-03-13: 60 ug via INTRAVENOUS
  Filled 2017-03-12: qty 0.3

## 2017-03-12 NOTE — Progress Notes (Signed)
Subjective:  Sitting in bedside chair ,tolerated hd yest. Sl below edw, no sob   Objective Vital signs in last 24 hours: Vitals:   03/11/17 2013 03/11/17 2333 03/12/17 0533 03/12/17 0802  BP: (!) 137/47  (!) 138/43   Pulse: (!) 54  65   Resp: 16  18   Temp: 97.7 F (36.5 C)  97.8 F (36.6 C)   TempSrc:      SpO2: 97% 95% 98% 95%  Weight:      Height:       Weight change: -3.5 kg (-7 lb 11.5 oz)  Physical Exam: General:  NAD/ alert , Ox3   Lungs: Coarse BS with bilateral wheezes  Heart: RRR with no rub or mur.  Abdomen:  BS +,soft /NT/ND Lower extremities:no pedal edema  Dialysis Access:  Left upper AVF + bruit  Dialysis Orders: MWF NW 4 hr 400/800 EDW 104 2 K 2.5 Ca left upper AVF no heparin Hectorol 2 Mircera 75 q 4 weeks - last 4/4 resumed - had been off since 1/24  OP Recent labs: hgb 10.8 stable 40% sat ipTH 148 Ca/P ok  On 4 renvela and sensipar 30  Problem/Plan: 1. Exacerbation of COPD influenza neg - Improving on steroid/nebs/doxycyline per primary 2. ESRD -  MWF - HD tomorrow K 3.7 - use  4 K bath agoind  Ck pre hd labs 3. Hypertension/volume  - BP controlled-3.5l uf yest =sl below edw 103.8 kg  Post wt/  on amlodipine 10 and MTP 12.5 bid/ may need EDW down a tad 4. Anemia  -hgb 10.4 - down to 9.4>9.2  =with  downward trend will redose on Wed  18th on 2 week interval instead of 4 week/and with ASVD needs q 2weeks as op NOT  q4wks/  5. Metabolic bone disease - Phos 3.0 / Ca  8.0 ,Corec Ca= 8.4 /Continue hectorol/binders Ca on the low side - needs 2.5 Ca bath with  Ca  lowish  - holding  sensipar 30 mg for now 6. Nutrition - heart healthy diet ok because K is low - add multivit 7. DM - sugars up sec  steroids- per primary 8. ASCVD hx TAVR, hx CVA, CABG; EF 45-50% 06/2016  Ernest Haber, PA-C [facility] Beeper [phone] 03/12/2017,9:33 AM  LOS: 1 day   Pt seen, examined and agree w A/P as above.  Kelly Splinter MD [facility] pager  [phone]   03/12/2017, 4:13 PM    Labs: Basic Metabolic Panel:  Recent Labs Lab 03/10/17 1947 03/11/17 0724 03/12/17 0415  NA 127* 129* 133*  K 3.6 4.3 3.7  CL 88* 93* 93*  CO2 24 23 24   GLUCOSE 253* 220* 270*  BUN 51* 48* 33*  CREATININE 6.61* 6.96* 4.83*  CALCIUM 7.4* 7.3* 8.0*  PHOS 4.0 4.8* 3.0   Liver Function Tests:  Recent Labs Lab 03/09/17 2205 03/10/17 1947 03/11/17 0724 03/12/17 0415  AST 21  --   --   --   ALT 15*  --   --   --   ALKPHOS 95  --   --   --   BILITOT 1.0  --   --   --   PROT 7.1  --   --   --   ALBUMIN 3.6 3.5 3.4* 3.5    Recent Labs Lab 03/09/17 2205  LIPASE 26    Recent Labs Lab 03/09/17 2205 03/10/17 0403 03/11/17 0724  WBC 7.0 6.2 5.2  HGB 10.3* 9.4* 9.2*  HCT 30.6* 28.5* 25.9*  MCV 93.3 94.4 90.9  PLT 205 165 160  CBG:  Recent Labs Lab 03/11/17 0336 03/11/17 1149 03/11/17 1705 03/11/17 2129 03/12/17 0813  GLUCAP 278* 189* 304* 324* 234*  Medications: . doxycycline (VIBRAMYCIN) IV     . amLODipine  10 mg Oral Daily  . aspirin EC  325 mg Oral Daily  . atorvastatin  40 mg Oral q1800  . doxercalciferol  2 mcg Intravenous Q M,W,F-HD  . famotidine  10 mg Oral QHS  . heparin  5,000 Units Subcutaneous Q8H  . insulin aspart  0-20 Units Subcutaneous TID WC  . insulin aspart  20 Units Subcutaneous TID WC  . insulin detemir  55 Units Subcutaneous QHS  . ipratropium-albuterol  3 mL Nebulization Q4H  . methylPREDNISolone (SOLU-MEDROL) injection  40 mg Intravenous Q12H  . metoprolol tartrate  12.5 mg Oral BID  . multivitamin  1 tablet Oral QHS  . sevelamer carbonate  2,400 mg Oral TID WC

## 2017-03-12 NOTE — Progress Notes (Signed)
PROGRESS NOTE   Jeffrey [name]  WIO:[id number]  DOB: 1941/07/16  DOA: 03/09/2017 PCP: Thurman Coyer, MD  Hospital course: Jeffrey [name] is a 76 y.o. male with a past medical history significant for ESRD on HD MWF, AS s/p TAVR, CHB with pacer, and IDDM who presents with worsening dyspnea, cough, and malaise.  He was admitted with COPD exacerbation.    Assessment & Plan:    1. Acute Resp Distress / COPD exacerbation Pt tested positive for parainfluenza virus, continues with severe cough and wheezing.  Add mucinex.  He says that he has not been receiving the scheduled nebs as ordered or the PRN nebs.  He struggled to breath most of the night.  He needs ongoing acute hospital care for treatment. He needs ongoing IV solumedrol.  Scheduled nebs need to be given, will intensify the frequency of treatments, continue antibiotics.  Continue to monitor closely.  .    2. Hyponatremia:  Mild, hypervolemic.  Improved after HD treatment 4/16.    3. Insulin-dependent diabetes: Type 2 DM with hyperglycemia - Uncontrolled blood sugars on steroids, will intensify therapy while on steroids, increase lantus to 55 units and increase prandial coverage to 20 units TID AC of novolog plus supplemental sliding scale coverage.    CBG (last 3)   Recent Labs  03/11/17 2129 03/12/17 0813 03/12/17 1148  GLUCAP 324* 234* 291*   4. History of CVA, AVR, CABG, CAD, chronic systolic CHF:  EF 42%. -Continue amlodipine, metoprolol -Continue aspirin, statin  5. ESRD on HD:  Nephrology following for HD needs, HD treatment received 4/16 in hospital.  Anemia associated with CKD  DVT prophylaxis: Heparin  Code Status: FULL  Family Communication: Wife, son, daughter  Disposition Plan: Home  Consults called: nephrology  Subjective: Pt coughing all night long and not able to sleep, still with diffuse wheezing   Objective: Vitals:   03/12/17 0533 03/12/17 0802 03/12/17 0958 03/12/17 1151  BP: (!)  138/43  (!) 145/34   Pulse: 65  62   Resp: 18  16   Temp: 97.8 F (36.6 C)  98 F (36.7 C)   TempSrc:   Oral   SpO2: 98% 95% 100% 100%  Weight:      Height:        Intake/Output Summary (Last 24 hours) at 03/12/17 1248 Last data filed at 03/12/17 0600  Gross per 24 hour  Intake              730 ml  Output                0 ml  Net              730 ml   Filed Weights   03/10/17 0208 03/11/17 0700 03/11/17 1107  Weight: 103.9 kg (229 lb) 107.3 kg (236 lb 8.9 oz) 103.8 kg (228 lb 13.4 oz)    Exam:  General exam: awake, alert cooperative seems frustrated Respiratory system:  diffuse exp wheezing bilateral Cardiovascular system: S1 & S2 heard.  Gastrointestinal system: Abdomen is nondistended, soft and nontender. Normal bowel sounds heard. Central nervous system: Alert and oriented. No focal neurological deficits. Extremities: no cyanosis.   Data Reviewed: Basic Metabolic Panel:  Recent Labs Lab 03/09/17 2205 03/10/17 0403 03/10/17 1947 03/11/17 0724 03/12/17 0415  NA 133* 132* 127* 129* 133*  K 4.2 3.2* 3.6 4.3 3.7  CL 95* 93* 88* 93* 93*  CO2 26 24 24 23 24   GLUCOSE 143* 330* 253*  220* 270*  BUN 28* 32* 51* 48* 33*  CREATININE 5.35* 5.83* 6.61* 6.96* 4.83*  CALCIUM 7.9* 7.4* 7.4* 7.3* 8.0*  PHOS  --   --  4.0 4.8* 3.0   Liver Function Tests:  Recent Labs Lab 03/09/17 2205 03/10/17 1947 03/11/17 0724 03/12/17 0415  AST 21  --   --   --   ALT 15*  --   --   --   ALKPHOS 95  --   --   --   BILITOT 1.0  --   --   --   PROT 7.1  --   --   --   ALBUMIN 3.6 3.5 3.4* 3.5    Recent Labs Lab 03/09/17 2205  LIPASE 26   No results for input(s): AMMONIA in the last 168 hours. CBC:  Recent Labs Lab 03/09/17 2205 03/10/17 0403 03/11/17 0724  WBC 7.0 6.2 5.2  HGB 10.3* 9.4* 9.2*  HCT 30.6* 28.5* 25.9*  MCV 93.3 94.4 90.9  PLT 205 165 160   Cardiac Enzymes: No results for input(s): CKTOTAL, CKMB, CKMBINDEX, TROPONINI in the last 168 hours. CBG  (last 3)   Recent Labs  03/11/17 2129 03/12/17 0813 03/12/17 1148  GLUCAP 324* 234* 291*   Recent Results (from the past 240 hour(s))  Respiratory Panel by PCR     Status: Abnormal   Collection Time: 03/09/17 11:13 PM  Result Value Ref Range Status   Adenovirus NOT DETECTED NOT DETECTED Final   Coronavirus 229E NOT DETECTED NOT DETECTED Final   Coronavirus HKU1 NOT DETECTED NOT DETECTED Final   Coronavirus NL63 NOT DETECTED NOT DETECTED Final   Coronavirus OC43 NOT DETECTED NOT DETECTED Final   Metapneumovirus NOT DETECTED NOT DETECTED Final   Rhinovirus / Enterovirus NOT DETECTED NOT DETECTED Final   Influenza A NOT DETECTED NOT DETECTED Final   Influenza B NOT DETECTED NOT DETECTED Final   Parainfluenza Virus 1 NOT DETECTED NOT DETECTED Final   Parainfluenza Virus 2 NOT DETECTED NOT DETECTED Final   Parainfluenza Virus 3 DETECTED (A) NOT DETECTED Final   Parainfluenza Virus 4 NOT DETECTED NOT DETECTED Final   Respiratory Syncytial Virus NOT DETECTED NOT DETECTED Final   Bordetella pertussis NOT DETECTED NOT DETECTED Final   Chlamydophila pneumoniae NOT DETECTED NOT DETECTED Final   Mycoplasma pneumoniae NOT DETECTED NOT DETECTED Final  MRSA PCR Screening     Status: None   Collection Time: 03/10/17  2:00 PM  Result Value Ref Range Status   MRSA by PCR NEGATIVE NEGATIVE Final    Comment:        The GeneXpert MRSA Assay (FDA approved for NASAL specimens only), is one component of a comprehensive MRSA colonization surveillance program. It is not intended to diagnose MRSA infection nor to guide or monitor treatment for MRSA infections.      Studies: No results found. Scheduled Meds: . amLODipine  10 mg Oral Daily  . aspirin EC  325 mg Oral Daily  . atorvastatin  40 mg Oral q1800  . [START ON 03/13/2017] darbepoetin (ARANESP) injection - DIALYSIS  60 mcg Intravenous Q Wed-HD  . doxercalciferol  2 mcg Intravenous Q M,W,F-HD  . famotidine  10 mg Oral QHS  . heparin   5,000 Units Subcutaneous Q8H  . insulin aspart  0-20 Units Subcutaneous TID WC  . insulin aspart  20 Units Subcutaneous TID WC  . insulin detemir  55 Units Subcutaneous QHS  . ipratropium-albuterol  3 mL Nebulization Q4H  . methylPREDNISolone (  SOLU-MEDROL) injection  40 mg Intravenous Q12H  . metoprolol tartrate  12.5 mg Oral BID  . multivitamin  1 tablet Oral QHS  . sevelamer carbonate  1,600 mg Oral TID WC   Continuous Infusions: . doxycycline (VIBRAMYCIN) IV      Principal Problem:   COPD with acute exacerbation (HCC) Active Problems:   Type 2 diabetes mellitus with chronic kidney disease on chronic dialysis, with long-term current use of insulin (HCC)   Essential hypertension   ESRD (end stage renal disease) on dialysis [facility])   Atherosclerosis of native coronary artery of native heart with angina pectoris (HCC)   Acute respiratory failure (HCC)   S/P TAVR (transcatheter aortic valve replacement)   Absolute anemia   Automatic implantable cardioverter-defibrillator in situ   Hyponatremia  Time spent:   Irwin Brakeman, MD, FAAFP [facility] Pager [phone]  If 7PM-7AM, please contact night-coverage [email or web address] Password [email or web address] 03/12/2017, 12:48 PM    LOS: 1 day

## 2017-03-12 NOTE — Care Management Note (Signed)
Case Management Note  Patient Details  Name: JASMINE MACEACHERN MRN: [id number] Date of Birth: 1940/12/26  Subjective/Objective:                 Patient admitted from home with wife for parainfluenza, hx copd esrd on hd. continues to c/o sob requiring iv steroids and nebs.    Action/Plan:  CM will continue to follow for dc needs.  Expected Discharge Date:  03/10/17               Expected Discharge Plan:  Home/Self Care  In-House Referral:     Discharge planning Services  CM Consult  Post Acute Care Choice:    Choice offered to:     DME Arranged:    DME Agency:     HH Arranged:    HH Agency:     Status of Service:  In process, will continue to follow  If discussed at Long Length of Stay Meetings, dates discussed:    Additional Comments:  Carles Collet, RN 03/12/2017, 4:45 PM

## 2017-03-13 DIAGNOSIS — R05 Cough: Secondary | ICD-10-CM

## 2017-03-13 LAB — RENAL FUNCTION PANEL
Albumin: 3.2 g/dL — ABNORMAL LOW (ref 3.5–5.0)
Anion gap: 16 — ABNORMAL HIGH (ref 5–15)
BUN: 54 mg/dL — ABNORMAL HIGH (ref 6–20)
CALCIUM: 7.7 mg/dL — AB (ref 8.9–10.3)
CO2: 21 mmol/L — ABNORMAL LOW (ref 22–32)
CREATININE: 6.38 mg/dL — AB (ref 0.61–1.24)
Chloride: 91 mmol/L — ABNORMAL LOW (ref 101–111)
GFR, EST AFRICAN AMERICAN: 9 mL/min — AB (ref >60)
GFR, EST NON AFRICAN AMERICAN: 7 mL/min — AB (ref >60)
Glucose, Bld: 253 mg/dL — ABNORMAL HIGH (ref 65–99)
Phosphorus: 4.5 mg/dL (ref 2.5–4.6)
Potassium: 4.2 mmol/L (ref 3.5–5.1)
Sodium: 128 mmol/L — ABNORMAL LOW (ref 135–145)

## 2017-03-13 LAB — CBC
HCT: 27.2 % — ABNORMAL LOW (ref 39.0–52.0)
Hemoglobin: 9.3 g/dL — ABNORMAL LOW (ref 13.0–17.0)
MCH: 31.4 pg (ref 26.0–34.0)
MCHC: 34.2 g/dL (ref 30.0–36.0)
MCV: 91.9 fL (ref 78.0–100.0)
PLATELETS: 212 10*3/uL (ref 150–400)
RBC: 2.96 MIL/uL — AB (ref 4.22–5.81)
RDW: 13.4 % (ref 11.5–15.5)
WBC: 9.7 10*3/uL (ref 4.0–10.5)

## 2017-03-13 LAB — GLUCOSE, CAPILLARY
GLUCOSE-CAPILLARY: 266 mg/dL — AB (ref 65–99)
GLUCOSE-CAPILLARY: 235 mg/dL — AB (ref 65–99)
Glucose-Capillary: 301 mg/dL — ABNORMAL HIGH (ref 65–99)
Glucose-Capillary: 187 mg/dL — ABNORMAL HIGH (ref 65–99)

## 2017-03-13 LAB — CK: CK TOTAL: 1039 U/L — AB (ref 49–397)

## 2017-03-13 MED ORDER — IPRATROPIUM-ALBUTEROL 0.5-2.5 (3) MG/3ML IN SOLN
3.0000 mL | Freq: Four times a day (QID) | RESPIRATORY_TRACT | Status: DC
  Administered 2017-03-13 (×2): 3 mL via RESPIRATORY_TRACT
  Filled 2017-03-13 (×2): qty 3

## 2017-03-13 MED ORDER — DOXERCALCIFEROL 4 MCG/2ML IV SOLN
INTRAVENOUS | Status: AC
  Administered 2017-03-13: 12:00:00
  Filled 2017-03-13: qty 2

## 2017-03-13 MED ORDER — IPRATROPIUM-ALBUTEROL 0.5-2.5 (3) MG/3ML IN SOLN
3.0000 mL | Freq: Four times a day (QID) | RESPIRATORY_TRACT | Status: DC
  Administered 2017-03-13: 3 mL via RESPIRATORY_TRACT
  Filled 2017-03-13 (×2): qty 3

## 2017-03-13 MED ORDER — DARBEPOETIN ALFA 60 MCG/0.3ML IJ SOSY
PREFILLED_SYRINGE | INTRAMUSCULAR | Status: AC
  Administered 2017-03-13: 12:00:00
  Filled 2017-03-13: qty 0.3

## 2017-03-13 MED ORDER — METHYLPREDNISOLONE SODIUM SUCC 125 MG IJ SOLR
60.0000 mg | Freq: Four times a day (QID) | INTRAMUSCULAR | Status: DC
  Administered 2017-03-13 – 2017-03-14 (×4): 60 mg via INTRAVENOUS
  Filled 2017-03-13 (×4): qty 2

## 2017-03-13 MED ORDER — DOXYCYCLINE HYCLATE 100 MG PO TABS
100.0000 mg | ORAL_TABLET | Freq: Two times a day (BID) | ORAL | Status: DC
  Administered 2017-03-13 – 2017-03-14 (×2): 100 mg via ORAL
  Filled 2017-03-13 (×2): qty 1

## 2017-03-13 NOTE — Progress Notes (Signed)
[facility]  PROGRESS NOTE  Jeffrey [name] PZW:[id number] DOB: Apr 12, 1941 DOA: 03/09/2017 PCP: Thurman Coyer, MD   Brief HPI:   76 y.o.malewith a past medical history significant for ESRD on HD MWF, AS s/p TAVR, CHB with pacer, and IDDMwho presents with worsening dyspnea, cough, and malaise.  He was admitted with COPD exacerbation.     Subjective   Patient seen after dialysis, continues to have coughing spell and says that he is not breathing better. CPK elevated to 1039.   Assessment/Plan:     Acute respiratory /distress COPD exacerbation- respiratory panel by PCR is positive for parainfluenza 3, will change Solu-Medrol to 60 minute grams IV every 6 hours, DuoNeb nebs every 6 hours scheduled. Mucinex 1 tablet by mouth twice a day. Continue doxycycline. Will change doxycycline from IV to by mouth.  Elevated CPK/mild rhabdomyolysis- patient is on atorvastatin, will hold atorvastatin at this time. Unfortunately we cannot give IV fluids as patient is on hemodialysis. Will repeat CPK in a.m.  Type 2 diabetes mellitus- patient's blood glucose uncontrolled on steroids. Currently on Lantus 55 units, Milkovich 20 units 3 times a day before meals along with sliding scale insulin.  History of CVA, AVR, CABG, CAD, chronic systolic CHF- Stable, continue amlodipine, metoprolol. Starting on hold as above.  ESRD on dialysis- nephrology following for hemodialysis.   DVT prophylaxis: Heparin  Code Status: Full code  Family Communication: No family at bedside   Disposition Plan: Home in next 1-2 days.   Consultants:  Nephrology  Procedures:  None   Antibiotics:   Anti-infectives    Start     Dose/Rate Route Frequency Ordered Stop   03/13/17 2100  doxycycline (VIBRA-TABS) tablet 100 mg     100 mg Oral Every 12 hours 03/13/17 1404     03/10/17 0230  doxycycline (VIBRAMYCIN) 100 mg in dextrose 5 % 250 mL IVPB  Status:  Discontinued     100 mg 125 mL/hr over 120 Minutes  Intravenous Every 12 hours 03/10/17 0209 03/13/17 1403       Objective   Vitals:   03/13/17 1056 03/13/17 1126 03/13/17 1133 03/13/17 1200  BP: (!) 140/46 134/60    Pulse: 64 60  (!) 50  Resp: 16     Temp: 97.8 F (36.6 C) 97.8 F (36.6 C)    TempSrc: Oral Oral    SpO2: 97% 98% 94%   Weight: 105 kg (231 lb 7.7 oz)     Height:        Intake/Output Summary (Last 24 hours) at 03/13/17 1433 Last data filed at 03/13/17 1056  Gross per 24 hour  Intake              980 ml  Output             3500 ml  Net            -2520 ml   Filed Weights   03/12/17 2112 03/13/17 0650 03/13/17 1056  Weight: 107.5 kg (237 lb) 108.5 kg (239 lb 3.2 oz) 105 kg (231 lb 7.7 oz)     Physical Examination:  General exam: Appears calm and comfortable. Respiratory system: Bilateral wheezing. Cardiovascular system:  RRR. No  murmurs, rubs, gallops. No pedal edema. GI system: Abdomen is nondistended, soft and nontender. No organomegaly.  Central nervous system. No focal neurological deficits. 5 x 5 power in all extremities. Skin: No rashes, lesions or ulcers. Psychiatry: Alert, oriented x 3.Judgement and insight appear normal. Affect normal.  Data Reviewed: I have personally reviewed following labs and imaging studies  CBG:  Recent Labs Lab 03/12/17 1148 03/12/17 1641 03/12/17 2152 03/13/17 0406 03/13/17 1238  GLUCAP 291* 219* 158* 301* 266*    CBC:  Recent Labs Lab 03/09/17 2205 03/10/17 0403 03/11/17 0724 03/13/17 0704  WBC 7.0 6.2 5.2 9.7  HGB 10.3* 9.4* 9.2* 9.3*  HCT 30.6* 28.5* 25.9* 27.2*  MCV 93.3 94.4 90.9 91.9  PLT 205 165 160 361    Basic Metabolic Panel:  Recent Labs Lab 03/10/17 0403 03/10/17 1947 03/11/17 0724 03/12/17 0415 03/13/17 0705  NA 132* 127* 129* 133* 128*  K 3.2* 3.6 4.3 3.7 4.2  CL 93* 88* 93* 93* 91*  CO2 24 24 23 24  21*  GLUCOSE 330* 253* 220* 270* 253*  BUN 32* 51* 48* 33* 54*  CREATININE 5.83* 6.61* 6.96* 4.83* 6.38*  CALCIUM 7.4*  7.4* 7.3* 8.0* 7.7*  PHOS  --  4.0 4.8* 3.0 4.5    Recent Results (from the past 240 hour(s))  Respiratory Panel by PCR     Status: Abnormal   Collection Time: 03/09/17 11:13 PM  Result Value Ref Range Status   Adenovirus NOT DETECTED NOT DETECTED Final   Coronavirus 229E NOT DETECTED NOT DETECTED Final   Coronavirus HKU1 NOT DETECTED NOT DETECTED Final   Coronavirus NL63 NOT DETECTED NOT DETECTED Final   Coronavirus OC43 NOT DETECTED NOT DETECTED Final   Metapneumovirus NOT DETECTED NOT DETECTED Final   Rhinovirus / Enterovirus NOT DETECTED NOT DETECTED Final   Influenza A NOT DETECTED NOT DETECTED Final   Influenza B NOT DETECTED NOT DETECTED Final   Parainfluenza Virus 1 NOT DETECTED NOT DETECTED Final   Parainfluenza Virus 2 NOT DETECTED NOT DETECTED Final   Parainfluenza Virus 3 DETECTED (A) NOT DETECTED Final   Parainfluenza Virus 4 NOT DETECTED NOT DETECTED Final   Respiratory Syncytial Virus NOT DETECTED NOT DETECTED Final   Bordetella pertussis NOT DETECTED NOT DETECTED Final   Chlamydophila pneumoniae NOT DETECTED NOT DETECTED Final   Mycoplasma pneumoniae NOT DETECTED NOT DETECTED Final  MRSA PCR Screening     Status: None   Collection Time: 03/10/17  2:00 PM  Result Value Ref Range Status   MRSA by PCR NEGATIVE NEGATIVE Final    Comment:        The GeneXpert MRSA Assay (FDA approved for NASAL specimens only), is one component of a comprehensive MRSA colonization surveillance program. It is not intended to diagnose MRSA infection nor to guide or monitor treatment for MRSA infections.      Liver Function Tests:  Recent Labs Lab 03/09/17 2205 03/10/17 1947 03/11/17 0724 03/12/17 0415 03/13/17 0705  AST 21  --   --   --   --   ALT 15*  --   --   --   --   ALKPHOS 95  --   --   --   --   BILITOT 1.0  --   --   --   --   PROT 7.1  --   --   --   --   ALBUMIN 3.6 3.5 3.4* 3.5 3.2*    Recent Labs Lab 03/09/17 2205  LIPASE 26   No results for  input(s): AMMONIA in the last 168 hours.  Cardiac Enzymes:  Recent Labs Lab 03/13/17 0704  CKTOTAL 1,039*   BNP (last 3 results)  Recent Labs  03/10/17 0403  BNP 656.1*    ProBNP (last 3 results) No  results for input(s): PROBNP in the last 8760 hours.    Studies: Dg Chest Port 1 View  Result Date: 03/12/2017 CLINICAL DATA:  76 year old male with COPD exacerbation, wheezing and cough. Subsequent encounter. EXAM: PORTABLE CHEST 1 VIEW COMPARISON:  03/09/2017 and 08/22/2015. FINDINGS: Cardiomegaly and biventricular pacer in place. Central pulmonary vascular prominence stable without frank pulmonary edema. No segmental consolidation or pneumothorax. Calcified tortuous aorta. No plain film evidence of pulmonary malignancy. IMPRESSION: Cardiomegaly. Central pulmonary vascular prominence stable in appearance without pulmonary edema. No focal consolidation. Calcified tortuous aorta. Electronically Signed   By: Genia Del M.D.   On: 03/12/2017 13:45    Scheduled Meds: . amLODipine  10 mg Oral Daily  . aspirin EC  325 mg Oral Daily  . darbepoetin (ARANESP) injection - DIALYSIS  60 mcg Intravenous Q Wed-HD  . doxercalciferol  2 mcg Intravenous Q M,W,F-HD  . doxycycline  100 mg Oral Q12H  . famotidine  10 mg Oral QHS  . guaiFENesin  600 mg Oral BID  . heparin  5,000 Units Subcutaneous Q8H  . insulin aspart  0-20 Units Subcutaneous TID WC  . insulin aspart  20 Units Subcutaneous TID WC  . insulin detemir  55 Units Subcutaneous QHS  . ipratropium-albuterol  3 mL Nebulization Q6H  . methylPREDNISolone (SOLU-MEDROL) injection  60 mg Intravenous Q6H  . metoprolol tartrate  12.5 mg Oral BID  . multivitamin  1 tablet Oral QHS  . sevelamer carbonate  1,600 mg Oral TID WC      Time spent: 25 min  [facility] Pager [phone]. If 7PM-7AM, please contact night-coverage at [email or web address], Office  [phone]  password [email or web address] 03/13/2017, 2:33 PM  LOS: 2 days

## 2017-03-13 NOTE — Procedures (Signed)
Breathing better.  Goal if 4 L with HD today, coming off it looks like just 1 kg over dry wt.  Stable for dc from renal standpoint.    I was present at this dialysis session, have reviewed the session itself and made  appropriate changes Kelly Splinter MD [place] pager [phone]   03/13/2017, 12:12 PM

## 2017-03-14 LAB — BASIC METABOLIC PANEL
ANION GAP: 14 (ref 5–15)
BUN: 42 mg/dL — ABNORMAL HIGH (ref 6–20)
CO2: 22 mmol/L (ref 22–32)
Calcium: 7.9 mg/dL — ABNORMAL LOW (ref 8.9–10.3)
Chloride: 91 mmol/L — ABNORMAL LOW (ref 101–111)
Creatinine, Ser: 4.51 mg/dL — ABNORMAL HIGH (ref 0.61–1.24)
GFR calc Af Amer: 13 mL/min — ABNORMAL LOW (ref >60)
GFR calc non Af Amer: 11 mL/min — ABNORMAL LOW (ref >60)
GLUCOSE: 190 mg/dL — AB (ref 65–99)
POTASSIUM: 4.4 mmol/L (ref 3.5–5.1)
Sodium: 127 mmol/L — ABNORMAL LOW (ref 135–145)

## 2017-03-14 LAB — CK: Total CK: 1029 U/L — ABNORMAL HIGH (ref 49–397)

## 2017-03-14 LAB — GLUCOSE, CAPILLARY
GLUCOSE-CAPILLARY: 260 mg/dL — AB (ref 65–99)
Glucose-Capillary: 205 mg/dL — ABNORMAL HIGH (ref 65–99)

## 2017-03-14 MED ORDER — PREDNISONE 10 MG PO TABS: ORAL_TABLET | ORAL | 0 refills | Status: DC

## 2017-03-14 MED ORDER — GUAIFENESIN ER 600 MG PO TB12: 600.0000 mg | ORAL_TABLET | Freq: Two times a day (BID) | ORAL | 0 refills | Status: DC

## 2017-03-14 MED ORDER — IPRATROPIUM-ALBUTEROL 0.5-2.5 (3) MG/3ML IN SOLN
3.0000 mL | Freq: Three times a day (TID) | RESPIRATORY_TRACT | Status: DC
  Administered 2017-03-14: 3 mL via RESPIRATORY_TRACT
  Filled 2017-03-14: qty 3

## 2017-03-14 MED ORDER — DOXYCYCLINE HYCLATE 100 MG PO TABS: 100.0000 mg | ORAL_TABLET | Freq: Two times a day (BID) | ORAL | 0 refills | Status: DC

## 2017-03-14 NOTE — Care Management Note (Signed)
Case Management Note  Patient Details  Name: Jeffrey [name] MRN: [id number] Date of Birth: Apr 17, 1941  Subjective/Objective:    CM following for progression and d/c planning.                 Action/Plan: 03/14/2017 Met with pt and wife, no d/c needs identified.   Expected Discharge Date:  03/14/17               Expected Discharge Plan:  Home/Self Care  In-House Referral:  NA  Discharge planning Services  CM Consult  Post Acute Care Choice:  NA Choice offered to:  NA  DME Arranged:  N/A DME Agency:  NA  HH Arranged:  NA HH Agency:  NA  Status of Service:  Completed, signed off  If discussed at [place] of Stay Meetings, dates discussed:    Additional Comments:  Adron Bene, RN 03/14/2017, 3:52 PM

## 2017-03-14 NOTE — Care Management Important Message (Signed)
Important Message  Patient Details  Name: Jeffrey [name] MRN: [id number] Date of Birth: 07-06-1941   Medicare Important Message Given:  Yes    Guled Gahan, Rory Percy, RN 03/14/2017, 10:39 AM

## 2017-03-14 NOTE — Discharge Summary (Addendum)
Physician Discharge Summary  Jeffrey [name] OAC:[id number] DOB: 01-20-1941 DOA: 03/09/2017  PCP: Thurman Coyer, MD  Admit date: 03/09/2017 Discharge date: 03/14/2017  Time spent: *25 minutes  Recommendations for Outpatient Follow-up:  1. Follow up PCP in 2 weeks 2. Hold Lipitor due to elevated  CPK 3. Check CPK level in 2 weeks   Discharge Diagnoses:  Principal Problem:   COPD with acute exacerbation ([place]) Active Problems:   Type 2 diabetes mellitus with chronic kidney disease on chronic dialysis, with long-term current use of insulin (HCC)   Essential hypertension   ESRD (end stage renal disease) on dialysis [facility])   Atherosclerosis of native coronary artery of native heart with angina pectoris (HCC)   Acute respiratory failure (HCC)   S/P TAVR (transcatheter aortic valve replacement)   Absolute anemia   Automatic implantable cardioverter-defibrillator in situ   Hyponatremia   Discharge Condition: Stable  Diet recommendation: Carb modified diet  Filed Weights   03/12/17 2112 03/13/17 0650 03/13/17 1056  Weight: 107.5 kg (237 lb) 108.5 kg (239 lb 3.2 oz) 105 kg (231 lb 7.7 oz)    History of present illness:  76 y.o.malewith a past medical history significant for ESRD on HD MWF, AS s/p TAVR, CHB with pacer, and IDDMwho presents with worsening dyspnea, cough, and malaise. He was admitted with COPD exacerbation.     Hospital Course:   Acute respiratory /distress COPD exacerbation- respiratory panel by PCR is positive for parainfluenza 3,  Solu-Medrol to 60 milli grams IV every 6 hours, DuoNeb nebs every 6 hours scheduled. Mucinex 1 tablet by mouth twice a day. Breathing has improved, will discharge on Doxycycline 100 mg po bid x 5 days. Prednisone taper over 10 days.  Elevated CPK/mild rhabdomyolysis- patient is on atorvastatin, will hold atorvastatin at this time. Unfortunately we cannot give IV fluids as patient is on hemodialysis. CPK level is 1029. Will need to  check CPK level in 2 weeks.  Type 2 diabetes mellitus- patient's blood glucose uncontrolled on steroids. Was started on  Levemir 55 units, meal coverage 20 units 3 times a day before meals along with sliding scale insulin. Will discharge on home dose of Levemir 35 units subcut daily.  History of CVA, AVR, CABG, CAD, chronic systolic CHF- Stable, continue amlodipine, metoprolol. Statin on hold as above.  ESRD on dialysis- patient on hemodialysis.  Procedures:  None   Consultations:  Nephrology  Discharge Exam: Vitals:   03/14/17 0426 03/14/17 0810  BP: 122/65 (!) 142/76  Pulse: 64 (!) 57  Resp: 18 16  Temp: 98.1 F (36.7 C) 97.7 F (36.5 C)    General: Appears in no acute distress Cardiovascular: RRR, S1S2 Respiratory: Clear bilaterally  Discharge Instructions   Discharge Instructions    Diet - low sodium heart healthy    Complete by:  As directed    Discharge instructions    Complete by:  As directed    Do not take Lipitor for two weeks. Check with your PCP before starting Lipitor   Increase activity slowly    Complete by:  As directed      Current Discharge Medication List    START taking these medications   Details  doxycycline (VIBRA-TABS) 100 MG tablet Take 1 tablet (100 mg total) by mouth every 12 (twelve) hours. Qty: 10 tablet, Refills: 0    guaiFENesin (MUCINEX) 600 MG 12 hr tablet Take 1 tablet (600 mg total) by mouth 2 (two) times daily. Qty: 10 tablet, Refills: 0  predniSONE (DELTASONE) 10 MG tablet Prednisone 40 mg po daily x 2 day then Prednisone 30 mg po daily x 2 day then Prednisone 20 mg po daily x 2 day then Prednisone 10 mg daily x 2 day then stop... Qty: 30 tablet, Refills: 0      CONTINUE these medications which have NOT CHANGED   Details  amLODipine (NORVASC) 10 MG tablet Take 10 mg by mouth daily.     aspirin EC 325 MG tablet Take 1 tablet (325 mg total) by mouth daily. Qty: 1 tablet, Refills: 0   Associated Diagnoses:  Aortic stenosis    benzonatate (TESSALON) 100 MG capsule Take 1 capsule (100 mg total) by mouth 2 (two) times daily as needed for cough. Qty: 21 capsule, Refills: 0    calcium carbonate (TUMS - DOSED IN MG ELEMENTAL CALCIUM) 500 MG chewable tablet Chew 1 tablet by mouth 4 (four) times daily as needed for indigestion or heartburn.    cinacalcet (SENSIPAR) 30 MG tablet Take 30 mg by mouth daily with supper.    Insulin Detemir (LEVEMIR FLEXTOUCH) 100 UNIT/ML Pen Inject 36 Units into the skin at bedtime.     lidocaine-prilocaine (EMLA) cream Apply 1 application topically once as needed (prior to accessing port).     metoprolol tartrate (LOPRESSOR) 25 MG tablet Take 0.5 tablets (12.5 mg total) by mouth 2 (two) times daily. Qty: 60 tablet, Refills: 6    nitroGLYCERIN (NITROSTAT) 0.4 MG SL tablet Place 0.4 mg under the tongue every 5 (five) minutes as needed for chest pain.     ondansetron (ZOFRAN ODT) 4 MG disintegrating tablet Take 1 tablet (4 mg total) by mouth every 8 (eight) hours as needed for nausea or vomiting. Qty: 20 tablet, Refills: 0    ondansetron (ZOFRAN) 8 MG tablet Take 8 mg by mouth daily as needed for nausea or vomiting.    ranitidine (ZANTAC) 150 MG tablet Take 150 mg by mouth 2 (two) times daily as needed for heartburn.    sevelamer carbonate (RENVELA) 800 MG tablet Take 2,400-3,200 mg by mouth See admin instructions. Take 4 capsules with meals and 3 with snacks      STOP taking these medications     atorvastatin (LIPITOR) 40 MG tablet        Allergies  Allergen Reactions  . Byetta 10 Mcg Pen [Exenatide] Diarrhea and Nausea And Vomiting  . Codeine Itching      The results of significant diagnostics from this hospitalization (including imaging, microbiology, ancillary and laboratory) are listed below for reference.    Significant Diagnostic Studies: Dg Chest Port 1 View  Result Date: 03/12/2017 CLINICAL DATA:  76 year old male with COPD exacerbation,  wheezing and cough. Subsequent encounter. EXAM: PORTABLE CHEST 1 VIEW COMPARISON:  03/09/2017 and 08/22/2015. FINDINGS: Cardiomegaly and biventricular pacer in place. Central pulmonary vascular prominence stable without frank pulmonary edema. No segmental consolidation or pneumothorax. Calcified tortuous aorta. No plain film evidence of pulmonary malignancy. IMPRESSION: Cardiomegaly. Central pulmonary vascular prominence stable in appearance without pulmonary edema. No focal consolidation. Calcified tortuous aorta. Electronically Signed   By: Genia Del M.D.   On: 03/12/2017 13:45   Dg Chest Port 1 View  Result Date: 03/09/2017 CLINICAL DATA:  Dyspnea, nausea, vomiting and diarrhea since Wednesday EXAM: PORTABLE CHEST 1 VIEW COMPARISON:  01/23/2017 FINDINGS: Borderline cardiomegaly with aortic valve repair. Aortic atherosclerosis with slight uncoiling of the thoracic aorta. No aneurysm. Right subclavian pacemaker apparatus with leads in the right atrium and right ventricle. Mild bilateral  stable hilar fullness likely vascular in etiology. Minimal bibasilar atelectasis without pneumonic consolidation. No effusion or pneumothorax. No acute osseous abnormality. IMPRESSION: Stable cardiomegaly with aortic atherosclerosis. Bibasilar atelectasis without pneumonic consolidation. Mild central vascular congestion. Electronically Signed   By: Ashley Royalty M.D.   On: 03/09/2017 23:22    Microbiology: Recent Results (from the past 240 hour(s))  Respiratory Panel by PCR     Status: Abnormal   Collection Time: 03/09/17 11:13 PM  Result Value Ref Range Status   Adenovirus NOT DETECTED NOT DETECTED Final   Coronavirus 229E NOT DETECTED NOT DETECTED Final   Coronavirus HKU1 NOT DETECTED NOT DETECTED Final   Coronavirus NL63 NOT DETECTED NOT DETECTED Final   Coronavirus OC43 NOT DETECTED NOT DETECTED Final   Metapneumovirus NOT DETECTED NOT DETECTED Final   Rhinovirus / Enterovirus NOT DETECTED NOT DETECTED Final    Influenza A NOT DETECTED NOT DETECTED Final   Influenza B NOT DETECTED NOT DETECTED Final   Parainfluenza Virus 1 NOT DETECTED NOT DETECTED Final   Parainfluenza Virus 2 NOT DETECTED NOT DETECTED Final   Parainfluenza Virus 3 DETECTED (A) NOT DETECTED Final   Parainfluenza Virus 4 NOT DETECTED NOT DETECTED Final   Respiratory Syncytial Virus NOT DETECTED NOT DETECTED Final   Bordetella pertussis NOT DETECTED NOT DETECTED Final   Chlamydophila pneumoniae NOT DETECTED NOT DETECTED Final   Mycoplasma pneumoniae NOT DETECTED NOT DETECTED Final  MRSA PCR Screening     Status: None   Collection Time: 03/10/17  2:00 PM  Result Value Ref Range Status   MRSA by PCR NEGATIVE NEGATIVE Final    Comment:        The GeneXpert MRSA Assay (FDA approved for NASAL specimens only), is one component of a comprehensive MRSA colonization surveillance program. It is not intended to diagnose MRSA infection nor to guide or monitor treatment for MRSA infections.      Labs: Basic Metabolic Panel:  Recent Labs Lab 03/10/17 1947 03/11/17 0724 03/12/17 0415 03/13/17 0705 03/14/17 0356  NA 127* 129* 133* 128* 127*  K 3.6 4.3 3.7 4.2 4.4  CL 88* 93* 93* 91* 91*  CO2 24 23 24  21* 22  GLUCOSE 253* 220* 270* 253* 190*  BUN 51* 48* 33* 54* 42*  CREATININE 6.61* 6.96* 4.83* 6.38* 4.51*  CALCIUM 7.4* 7.3* 8.0* 7.7* 7.9*  PHOS 4.0 4.8* 3.0 4.5  --    Liver Function Tests:  Recent Labs Lab 03/09/17 2205 03/10/17 1947 03/11/17 0724 03/12/17 0415 03/13/17 0705  AST 21  --   --   --   --   ALT 15*  --   --   --   --   ALKPHOS 95  --   --   --   --   BILITOT 1.0  --   --   --   --   PROT 7.1  --   --   --   --   ALBUMIN 3.6 3.5 3.4* 3.5 3.2*    Recent Labs Lab 03/09/17 2205  LIPASE 26   No results for input(s): AMMONIA in the last 168 hours. CBC:  Recent Labs Lab 03/09/17 2205 03/10/17 0403 03/11/17 0724 03/13/17 0704  WBC 7.0 6.2 5.2 9.7  HGB 10.3* 9.4* 9.2* 9.3*  HCT 30.6*  28.5* 25.9* 27.2*  MCV 93.3 94.4 90.9 91.9  PLT 205 165 160 212   Cardiac Enzymes:  Recent Labs Lab 03/13/17 0704 03/14/17 0356  CKTOTAL 1,039* 1,029*   BNP: BNP (last  3 results)  Recent Labs  03/10/17 0403  BNP 656.1*    ProBNP (last 3 results) No results for input(s): PROBNP in the last 8760 hours.  CBG:  Recent Labs Lab 03/13/17 1238 03/13/17 1714 03/13/17 2122 03/14/17 0405 03/14/17 0737  GLUCAP 266* 235* 187* 205* 260*       Signed:  Oswald Hillock MD.  [facility] 03/14/2017, 10:05 AM

## 2017-03-14 NOTE — Progress Notes (Signed)
Pt discharge to home, discharge instructions, medications, prescriptions and follow up appointments discussed and reviewed with pt, verbalized understanding. IV discontinued, catheter intact, site clean and dry. Pt was escorted out of the unit in wheelchair with wife, took all belongings with them.

## 2017-03-14 NOTE — Care Management Note (Signed)
Case Management Note  Patient Details  Name: Jeffrey [name] MRN: [id number] Date of Birth: 09-Nov-1941  Subjective/Objective:       CM following for progression and d/c planning.              Action/Plan: 03/14/2017 Pt for d/c today to home, no d/c needs identified.   Expected Discharge Date:  03/14/17               Expected Discharge Plan:  Home/Self Care  In-House Referral:   None  Discharge planning Services  CM Consult  Post Acute Care Choice:   N/A Choice offered to:   N/A  DME Arranged:   N/A DME Agency:   N/A  HH Arranged:   N/A HH Agency:   N/A  Status of Service:  Complete  If discussed at Long Length of Stay Meetings, dates discussed:    Additional Comments:  Adron Bene, RN 03/14/2017, 10:40 AM

## 2017-03-22 ENCOUNTER — Encounter (HOSPITAL_COMMUNITY): Payer: Self-pay | Admitting: Emergency Medicine

## 2017-03-22 ENCOUNTER — Emergency Department (HOSPITAL_COMMUNITY)
Admission: EM | Admit: 2017-03-22 | Discharge: 2017-03-22 | Disposition: A | Discharge status: Home / Self Care | Payer: Medicare Other | Attending: Emergency Medicine | Admitting: Emergency Medicine

## 2017-03-22 ENCOUNTER — Emergency Department (HOSPITAL_COMMUNITY): Payer: Medicare Other

## 2017-03-22 DIAGNOSIS — I252 Old myocardial infarction: Secondary | ICD-10-CM | POA: Diagnosis not present

## 2017-03-22 DIAGNOSIS — R0601 Orthopnea: Secondary | ICD-10-CM | POA: Insufficient documentation

## 2017-03-22 DIAGNOSIS — I132 Hypertensive heart and chronic kidney disease with heart failure and with stage 5 chronic kidney disease, or end stage renal disease: Secondary | ICD-10-CM | POA: Diagnosis not present

## 2017-03-22 DIAGNOSIS — I2510 Atherosclerotic heart disease of native coronary artery without angina pectoris: Secondary | ICD-10-CM | POA: Insufficient documentation

## 2017-03-22 DIAGNOSIS — Z7982 Long term (current) use of aspirin: Secondary | ICD-10-CM | POA: Diagnosis not present

## 2017-03-22 DIAGNOSIS — N186 End stage renal disease: Secondary | ICD-10-CM | POA: Insufficient documentation

## 2017-03-22 DIAGNOSIS — E1122 Type 2 diabetes mellitus with diabetic chronic kidney disease: Secondary | ICD-10-CM | POA: Diagnosis not present

## 2017-03-22 DIAGNOSIS — Z8673 Personal history of transient ischemic attack (TIA), and cerebral infarction without residual deficits: Secondary | ICD-10-CM | POA: Insufficient documentation

## 2017-03-22 DIAGNOSIS — J441 Chronic obstructive pulmonary disease with (acute) exacerbation: Secondary | ICD-10-CM | POA: Insufficient documentation

## 2017-03-22 DIAGNOSIS — Z87891 Personal history of nicotine dependence: Secondary | ICD-10-CM | POA: Insufficient documentation

## 2017-03-22 DIAGNOSIS — R0602 Shortness of breath: Secondary | ICD-10-CM | POA: Diagnosis present

## 2017-03-22 DIAGNOSIS — I5042 Chronic combined systolic (congestive) and diastolic (congestive) heart failure: Secondary | ICD-10-CM | POA: Insufficient documentation

## 2017-03-22 DIAGNOSIS — Z992 Dependence on renal dialysis: Secondary | ICD-10-CM | POA: Diagnosis not present

## 2017-03-22 HISTORY — DX: Chronic obstructive pulmonary disease, unspecified: J44.9

## 2017-03-22 LAB — COMPREHENSIVE METABOLIC PANEL
ALBUMIN: 3.4 g/dL — AB (ref 3.5–5.0)
ALT: 17 U/L (ref 17–63)
ANION GAP: 13 (ref 5–15)
AST: 17 U/L (ref 15–41)
Alkaline Phosphatase: 61 U/L (ref 38–126)
BUN: 84 mg/dL — AB (ref 6–20)
CHLORIDE: 99 mmol/L — AB (ref 101–111)
CO2: 24 mmol/L (ref 22–32)
Calcium: 8.3 mg/dL — ABNORMAL LOW (ref 8.9–10.3)
Creatinine, Ser: 5.14 mg/dL — ABNORMAL HIGH (ref 0.61–1.24)
GFR calc Af Amer: 11 mL/min — ABNORMAL LOW (ref >60)
GFR calc non Af Amer: 10 mL/min — ABNORMAL LOW (ref >60)
GLUCOSE: 234 mg/dL — AB (ref 65–99)
POTASSIUM: 5 mmol/L (ref 3.5–5.1)
Sodium: 136 mmol/L (ref 135–145)
Total Bilirubin: 0.5 mg/dL (ref 0.3–1.2)
Total Protein: 5.7 g/dL — ABNORMAL LOW (ref 6.5–8.1)

## 2017-03-22 LAB — CBC WITH DIFFERENTIAL/PLATELET
BASOS ABS: 0 10*3/uL (ref 0.0–0.1)
BASOS PCT: 0 %
EOS ABS: 0.2 10*3/uL (ref 0.0–0.7)
Eosinophils Relative: 1 %
HCT: 31.1 % — ABNORMAL LOW (ref 39.0–52.0)
HEMOGLOBIN: 10.4 g/dL — AB (ref 13.0–17.0)
Lymphocytes Relative: 17 %
Lymphs Abs: 2.2 10*3/uL (ref 0.7–4.0)
MCH: 31.7 pg (ref 26.0–34.0)
MCHC: 33.4 g/dL (ref 30.0–36.0)
MCV: 94.8 fL (ref 78.0–100.0)
Monocytes Absolute: 1 10*3/uL (ref 0.1–1.0)
Monocytes Relative: 8 %
Neutro Abs: 9.2 10*3/uL — ABNORMAL HIGH (ref 1.7–7.7)
Neutrophils Relative %: 74 %
Platelets: 167 10*3/uL (ref 150–400)
RBC: 3.28 MIL/uL — AB (ref 4.22–5.81)
RDW: 15.1 % (ref 11.5–15.5)
WBC: 12.6 10*3/uL — AB (ref 4.0–10.5)

## 2017-03-22 LAB — TROPONIN I: TROPONIN I: 0.06 ng/mL — AB (ref <0.03)

## 2017-03-22 LAB — CK: Total CK: 100 U/L (ref 49–397)

## 2017-03-22 LAB — BRAIN NATRIURETIC PEPTIDE: B NATRIURETIC PEPTIDE 5: 507.6 pg/mL — AB (ref 0.0–100.0)

## 2017-03-22 MED ORDER — IPRATROPIUM BROMIDE 0.02 % IN SOLN
0.5000 mg | Freq: Once | RESPIRATORY_TRACT | Status: AC
  Administered 2017-03-22: 0.5 mg via RESPIRATORY_TRACT
  Filled 2017-03-22: qty 2.5

## 2017-03-22 MED ORDER — METHYLPREDNISOLONE SODIUM SUCC 125 MG IJ SOLR
125.0000 mg | Freq: Once | INTRAMUSCULAR | Status: AC
  Administered 2017-03-22: 125 mg via INTRAVENOUS
  Filled 2017-03-22: qty 2

## 2017-03-22 MED ORDER — ALBUTEROL SULFATE (2.5 MG/3ML) 0.083% IN NEBU
5.0000 mg | INHALATION_SOLUTION | Freq: Once | RESPIRATORY_TRACT | Status: AC
Start: 2017-03-22 — End: 2017-03-22
  Administered 2017-03-22: 5 mg via RESPIRATORY_TRACT
  Filled 2017-03-22: qty 6

## 2017-03-22 MED ORDER — ALBUTEROL SULFATE HFA 108 (90 BASE) MCG/ACT IN AERS
2.0000 | INHALATION_SPRAY | RESPIRATORY_TRACT | Status: DC | PRN
Start: 2017-03-22 — End: 2017-03-22
  Administered 2017-03-22: 2 via RESPIRATORY_TRACT
  Filled 2017-03-22: qty 6.7

## 2017-03-22 NOTE — ED Provider Notes (Signed)
Patient presented to the ER with cough and shortness of breath. Patient was recently hospitalized for same. Patient reports that after going home from the hospital he has not improved. He has persistent shortness of breath productive cough. This morning he coughed and had a lot of phlegm corrected his stroke, causing him to feel like he was choking.  Face to face Exam: HEENT - PERRLA Lungs - bilateral wheezing Heart - RRR, no M/R/G Abd - S/NT/ND Neuro - alert, oriented x3  Plan: Patient with shortness of breath and bronchospasm on examination. Obtain x-ray to rule out developing pneumonia, treated with bronchodilator therapy and follow progress.   Orpah Greek, MD 03/22/17 [phone]

## 2017-03-22 NOTE — ED Provider Notes (Signed)
[facility] DEPT Provider Note   CSN: [id number] Arrival date & time: 03/22/17  0433     History   Chief Complaint Chief Complaint  Patient presents with  . Shortness of Breath    HPI Jeffrey [name] is a 76 y.o. male.  Patient with a history of COPD, CVA, ESRD-HD (MWF), HLD, HTN, PVD, atrial fibrillation, cardiac pacemaker, CHF, CAD, valvular disease, T2DM presents with severe SOB. He reports recent admission for same and feeling no better when he went home. He has a deep cough that is chronic, but no fever, vomiting, dizziness or syncope/near-syncope. He states his only chest pain is with cough. Tonight, the patient woke from sleep choking and having severe SOB, prompting ER visit for evaluation. He does not use oxygen at home. He does not have a nebulizer or inhaler. He was given a taper dose of steroids when recently discharged from the hospital and is scheduled to complete that today. He is compliant with medications, dialysis.   The history is provided by the patient. No language interpreter was used.  Shortness of Breath  Associated symptoms include cough and chest pain (See HPI.). Pertinent negatives include no fever, no vomiting and no abdominal pain.    Past Medical History:  Diagnosis Date  . Anginal pain ([place])   . Aortic stenosis    a. severe by echo 09/2014  . Atrial fibrillation ([place])    a. not well documented, not on anticoagulation  . CHF (congestive heart failure) ([place])   . Claustrophobia   . Complete heart block ([place])   . COPD (chronic obstructive pulmonary disease) ([place])   . Coronary artery disease    a. chronically occluded RCA per cath 09/2014 with collaterals  . CVA (cerebral vascular accident) ([place]) 10/2014   denies residual on 07/11/2015  . ESRD (end stage renal disease) on dialysis [facility])    a. on dialysis; [facility]; MWF, LUE fistula (07/11/2015)  . History of blood transfusion    "related to gallbladder OR"  . History of stomach ulcers     . Hyperlipidemia   . Hypertension   . Iron deficiency anemia   . Myocardial infarction ([place]) 10/2014  . Peripheral vascular disease ([place])   . Pneumonia   . Presence of permanent cardiac pacemaker   . Renal insufficiency   . 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 Type II diabetes mellitus [facility])     Patient Active Problem List   Diagnosis Date Noted  . Hyponatremia 03/10/2017  . COPD with acute exacerbation ([place]) 03/10/2017  . Non-healing open wound of left groin 09/05/2015  . Automatic implantable cardioverter-defibrillator in situ 08/08/2015  . 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 Hyperkalemia 07/27/2015  . Aortic stenosis   . Hypervolemia   . Severe aortic stenosis   . Chronic combined systolic and diastolic CHF (congestive heart failure) ([place]) 07/19/2015  . Fluid overload 07/18/2015  . Acute respiratory failure ([place]) 07/18/2015  . Hypoxia   . Cardiac device in situ, other   . 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/2015  . Chest pain   . Atherosclerosis of native coronary artery of native heart with angina pectoris ([place])   . Protein-calorie malnutrition, severe ([place]) 01/14/2015  . ESRD (end stage renal disease) on dialysis ([place]) 01/12/2015  . Aortic valve stenosis, moderate 01/11/2015  . Mobitz type 1 second degree AV block   .  Elevated troponin 09/30/2014  . History of stroke 09/29/2014  . Type 2 diabetes mellitus with chronic kidney disease on chronic dialysis, with long-term current use of insulin ([place]) 09/29/2014  . HLD (hyperlipidemia) 09/29/2014  . CAD (coronary artery disease) 09/29/2014  . Essential hypertension   . History of tobacco use 11/21/2012  . Hyperlipidemia 01/19/2010  . Arthritis, degenerative 01/19/2010  . Allergic rhinitis 12/05/2009  . Idiopathic peripheral neuropathy 01/05/2009  .  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  . Absolute anemia 11/13/2007    Past Surgical History:  Procedure Laterality Date  . AV FISTULA PLACEMENT Left 10/19/2014   Procedure: BRACHIOCEPHALIC ARTERIOVENOUS (AV) FISTULA CREATION ;  Surgeon: Conrad Ply[place]onville, MD;  Location: [facility];  Service: Vascular;  Laterality: Left;  . CARDIAC CATHETERIZATION    . CARDIAC CATHETERIZATION N/A 07/22/2015   Procedure: Right/Left Heart Cath and Coronary Angiography;  Surgeon: [name], MD;  Location: [facility] CV LAB;  Service: Cardiovascular;  Laterality: N/A;  . CATARACT EXTRACTION W/ INTRAOCULAR LENS  IMPLANT, BILATERAL Bilateral 1990's  . CHOLECYSTECTOMY OPEN  1980's  . COLONOSCOPY W/ BIOPSIES AND POLYPECTOMY    . CORONARY ANGIOPLASTY    . EP IMPLANTABLE DEVICE N/A 07/11/2015   Procedure: Pacemaker Implant;  Surgeon: [name], MD;  Location: [facility] CV LAB;  Service: Cardiovascular;  Laterality: N/A;  . ESOPHAGOGASTRODUODENOSCOPY  08/01/2012   Procedure: ESOPHAGOGASTRODUODENOSCOPY (EGD);  Surgeon: [name], MD;  Location: [facility];  Service: Endoscopy;  Laterality: N/A;  . INSERT / REPLACE / REMOVE PACEMAKER  07/11/2015  . INSERTION OF DIALYSIS CATHETER Right 02/02/2015   Procedure: INSERTION OF DIALYSIS CATHETER  RIGHT INTERNAL JUGULAR;  Surgeon: Mal [name], MD;  Location: [facility];  Service: Vascular;  Laterality: Right;  . LEFT AND RIGHT HEART CATHETERIZATION WITH CORONARY ANGIOGRAM N/A 09/30/2014   Procedure: LEFT AND RIGHT HEART CATHETERIZATION WITH CORONARY ANGIOGRAM;  Surgeon: [name], MD;  Location: [facility] CATH LAB;  Service: Cardiovascular;  Laterality: N/A;  . TEE WITHOUT CARDIOVERSION N/A 08/02/2015   Procedure: TRANSESOPHAGEAL ECHOCARDIOGRAM (TEE);  Surgeon: [name], MD;  Location: [facility];  Service: Open Heart Surgery;  Laterality: N/A;  . TONSILLECTOMY    . TRANSCATHETER AORTIC VALVE REPLACEMENT, TRANSFEMORAL Left 08/02/2015   Procedure:  TRANSCATHETER AORTIC VALVE REPLACEMENT, TRANSFEMORAL;  Surgeon: [name], MD;  Location: [facility];  Service: Open Heart Surgery;  Laterality: Left;       Home Medications    Prior to Admission medications   Medication Sig Start Date End Date Taking? Authorizing Provider  amLODipine (NORVASC) 10 MG tablet Take 10 mg by mouth daily.    Yes Historical Provider, MD  aspirin EC 325 MG tablet Take 1 tablet (325 mg total) by mouth daily. 09/05/15  Yes [name], MD  benzonatate (TESSALON) 100 MG capsule Take 1 capsule (100 mg total) by mouth 2 (two) times daily as needed for cough. 01/23/17  Yes Gareth Morgan, MD  calcium carbonate (TUMS - DOSED IN MG ELEMENTAL CALCIUM) 500 MG chewable tablet Chew 1 tablet by mouth 4 (four) times daily as needed for indigestion or heartburn.   Yes Historical Provider, MD  cinacalcet (SENSIPAR) 30 MG tablet Take 30 mg by mouth daily with supper.   Yes Historical Provider, MD  guaiFENesin (MUCINEX) 600 MG 12 hr tablet Take 1 tablet (600 mg total) by mouth 2 (two) times daily. 03/14/17  Yes Oswald Hillock, MD  Insulin Detemir (LEVEMIR FLEXTOUCH) 100 UNIT/ML Pen Inject 36  Units into the skin at bedtime.    Yes Historical Provider, MD  lidocaine-prilocaine (EMLA) cream Apply 1 application topically once as needed (prior to accessing port).    Yes Historical Provider, MD  metoprolol tartrate (LOPRESSOR) 25 MG tablet Take 0.5 tablets (12.5 mg total) by mouth 2 (two) times daily. 08/05/15  Yes Bhavinkumar Bhagat, PA  nitroGLYCERIN (NITROSTAT) 0.4 MG SL tablet Place 0.4 mg under the tongue every 5 (five) minutes as needed for chest pain.    Yes Historical Provider, MD  ondansetron (ZOFRAN ODT) 4 MG disintegrating tablet Take 1 tablet (4 mg total) by mouth every 8 (eight) hours as needed for nausea or vomiting. 01/23/17  Yes Gareth Morgan, MD  ondansetron (ZOFRAN) 8 MG tablet Take 8 mg by mouth daily as needed for nausea or vomiting.   Yes Historical Provider, MD    predniSONE (DELTASONE) 10 MG tablet Prednisone 40 mg po daily x 2 day then Prednisone 30 mg po daily x 2 day then Prednisone 20 mg po daily x 2 day then Prednisone 10 mg daily x 2 day then stop... 03/14/17  Yes Oswald Hillock, MD  ranitidine (ZANTAC) 150 MG tablet Take 150 mg by mouth 2 (two) times daily as needed for heartburn.   Yes Historical Provider, MD  sevelamer carbonate (RENVELA) 800 MG tablet Take 2,400-3,200 mg by mouth See admin instructions. Take 4 capsules with meals and 3 with snacks   Yes Historical Provider, MD    Family History Family History  Problem Relation Age of Onset  . Diabetes Father   . Heart disease Father   . Diabetes Sister   . Alzheimer's disease Mother     Social History Social History  Substance Use Topics  . Smoking status: Former Smoker    Packs/day: 2.00    Years: 32.00    Quit date: 11/27/1983  . Smokeless tobacco: Never Used  . Alcohol use Yes     Comment: 07/11/2015 "stopped drinking in 09/2014"     Allergies   Byetta 10 mcg pen [exenatide] and Codeine   Review of Systems Review of Systems  Constitutional: Negative for chills and fever.  HENT: Negative.   Respiratory: Positive for cough, choking and shortness of breath.   Cardiovascular: Positive for chest pain (See HPI.).  Gastrointestinal: Negative.  Negative for abdominal pain, nausea and vomiting.  Musculoskeletal: Negative.  Negative for myalgias.  Skin: Negative.   Neurological: Negative.  Negative for syncope and weakness.     Physical Exam Updated Vital Signs BP (!) 155/47   Pulse (!) 55   Temp 98.3 F (36.8 C) (Oral)   Resp 18   SpO2 98%   Physical Exam  Constitutional: He is oriented to person, place, and time. He appears well-developed and well-nourished.  HENT:  Head: Normocephalic.  Neck: Normal range of motion. Neck supple.  Cardiovascular: Normal rate and regular rhythm.   Pulmonary/Chest: Effort normal. He has wheezes. He has rales.  Actively coughing.   Abdominal: Soft. Bowel sounds are normal. There is no tenderness. There is no rebound and no guarding.  Musculoskeletal: Normal range of motion. He exhibits no edema.  Neurological: He is alert and oriented to person, place, and time.  CN's 3-12 grossly intact. Speech is clear and focused. No facial asymmetry. No lateralizing weakness. Reflexes are equal. No deficits of coordination. Ambulatory without imbalance.    Skin: Skin is warm and dry. No rash noted.  Psychiatric: He has a normal mood and affect.  ED Treatments / Results  Labs (all labs ordered are listed, but only abnormal results are displayed) Labs Reviewed  CBC WITH DIFFERENTIAL/PLATELET  TROPONIN I  BRAIN NATRIURETIC PEPTIDE  COMPREHENSIVE METABOLIC PANEL  CK    EKG  EKG Interpretation  Date/Time:  Friday March 22 2017 04:41:33 EDT Ventricular Rate:  55 PR Interval:    QRS Duration: 204 QT Interval:  498 QTC Calculation: 477 R Axis:   -81 Text Interpretation:  VENTRICULAR PACED RHYTHM with underlying atrial flutter No significant change since last tracing Confirmed by POLLINA  MD, CHRISTOPHER ([id number]) on 03/22/2017 4:52:06 AM       Radiology No results found.  Procedures Procedures (including critical care time)  Medications Ordered in ED Medications  albuterol (PROVENTIL) (2.5 MG/3ML) 0.083% nebulizer solution 5 mg (not administered)  ipratropium (ATROVENT) nebulizer solution 0.5 mg (not administered)     Initial Impression / Assessment and Plan / ED Course  I have reviewed the triage vital signs and the nursing notes.  Pertinent labs & imaging results that were available during my care of the patient were reviewed by me and considered in my medical decision making (see chart for details).     Patient here for evaluation and management of SOB and cough. He reports he woke up tonight coughing and choking. He was not able to catch his breath and became concerned. He has had similar symptoms  before. Chart reviewed. Consult notes show this was a common complaint for the patient on recent admission.   He is not hypoxic here. Duoneb given with improvement in cough. CXR does not show any evidence of fluid overload. Labs are essentially unremarkable. Troponin 0.06, appears to be baseline. CK was found to be elevated during that admission to ~1000 - tonight it is normal at 100. Creatinine c/w ESRD.   The patient and family are reassured. IV Solumedrol provided. Will refer to pulmonology to discuss breathing difficulty and potential benefit of nebulizer machine. He is discharged with an inhaler.   Final Clinical Impressions(s) / ED Diagnoses   Final diagnoses:  None   1. Dyspnea  New Prescriptions New Prescriptions   No medications on file     Charlann Lange, PA-C 03/22/17 [place], MD 03/22/17 [phone]

## 2017-03-22 NOTE — ED Triage Notes (Signed)
Patient arrives to ED from home. Patient recently hospitalized - discharged a week ago. Reports that he was awakened from sleep feeling as if he was choking and was short of breath at approx 0300. Patient speaking in full sentences, but then begins to cough.  Denies pain. Patient takes dialysis on Mon, Wed & Fri. Due today.

## 2017-03-22 NOTE — ED Notes (Signed)
ED Provider at bedside. 

## 2017-03-27 ENCOUNTER — Encounter: Payer: Self-pay | Admitting: Pulmonary Disease

## 2017-03-27 ENCOUNTER — Ambulatory Visit (INDEPENDENT_AMBULATORY_CARE_PROVIDER_SITE_OTHER): Payer: Medicare Other | Admitting: Pulmonary Disease

## 2017-03-27 VITALS — BP 138/58 | HR 85 | Ht 71.0 in | Wt 231.8 lb

## 2017-03-27 DIAGNOSIS — J449 Chronic obstructive pulmonary disease, unspecified: Secondary | ICD-10-CM

## 2017-03-27 MED ORDER — BUDESONIDE-FORMOTEROL FUMARATE 160-4.5 MCG/ACT IN AERO: 2.0000 | INHALATION_SPRAY | Freq: Two times a day (BID) | RESPIRATORY_TRACT | 0 refills | Status: DC

## 2017-03-27 MED ORDER — IPRATROPIUM-ALBUTEROL 0.5-2.5 (3) MG/3ML IN SOLN: 3.0000 mL | Freq: Four times a day (QID) | RESPIRATORY_TRACT | 6 refills | Status: DC | PRN

## 2017-03-27 NOTE — Progress Notes (Signed)
Jeffrey [name]    [id number]    03-03-1941  Primary Care Physician:[name],[name], MD  Referring Physician: Thurman Coyer, MD [address], [place] [address]  Chief complaint:  Consult for evaluation, management of COPD  HPI: 76 year old with history of end-stage renal disease on hemodialysis, COPD (CAT Score 37), diabetes mellitus, aortic stenosis s/p TAVR, ICD implantation. He was admitted in April 2018 for acute respiratory failure, COPD exacerbation in setting of parainfluenza virus. He was treated with Solu-Medrol, doonebs, Mucinex, doxycycline with improvement in symptoms.  Since his discharge he reports ongoing issues with dyspnea on exertion, cough with sputum production, wheezing. He has worsening symptoms during dialysis which he attributes to being placed under a air conditoner vent. He states that the cold air makes his breathing worse. He went to the ED for this issue and was given neb treatment. Is currently on just albuterol rescue inhaler.  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 Smoking history: 60 pack year smoking history. Quit in 1983.  Outpatient Encounter Prescriptions as of 03/27/2017  Medication Sig  . albuterol (PROVENTIL HFA;VENTOLIN HFA) 108 (90 Base) MCG/ACT inhaler Inhale into the lungs.  [name] amLODipine (NORVASC) 10 MG tablet Take 10 mg by mouth daily.   [name] aspirin EC 325 MG tablet Take 1 tablet (325 mg total) by mouth daily.  . calcium carbonate (TUMS - DOSED IN MG ELEMENTAL CALCIUM) 500 MG chewable tablet Chew 1 tablet by mouth 4 (four) times daily as needed for indigestion or heartburn.  . cinacalcet (SENSIPAR) 30 MG tablet Take 30 mg by mouth daily with supper.  . Insulin Detemir (LEVEMIR FLEXTOUCH) 100 UNIT/ML Pen Inject 36 Units into the skin at bedtime.   . lidocaine-prilocaine (EMLA) cream Apply 1 application topically  once as needed (prior to accessing port).   . metoprolol tartrate (LOPRESSOR) 25 MG tablet Take 0.5 tablets (12.5 mg total) by mouth 2 (two) times daily.  . nitroGLYCERIN (NITROSTAT) 0.4 MG SL tablet Place 0.4 mg under the tongue every 5 (five) minutes as needed for chest pain.   [name] ondansetron (ZOFRAN ODT) 4 MG disintegrating tablet Take 1 tablet (4 mg total) by mouth every 8 (eight) hours as needed for nausea or vomiting.  . ondansetron (ZOFRAN) 8 MG tablet Take 8 mg by mouth daily as needed for nausea or vomiting.  . ranitidine (ZANTAC) 150 MG tablet Take 150 mg by mouth 2 (two) times daily as needed for heartburn.  . sevelamer carbonate (RENVELA) 800 MG tablet Take 2,400-3,200 mg by mouth See admin instructions. Take 4 capsules with meals and 3 with snacks  . [DISCONTINUED] benzonatate (TESSALON) 100 MG capsule Take 1 capsule (100 mg total) by mouth 2 (two) times daily as needed for cough.  . [DISCONTINUED] guaiFENesin (MUCINEX) 600 MG 12 hr tablet Take 1 tablet (600 mg total) by mouth 2 (two) times daily.  . [DISCONTINUED] predniSONE (DELTASONE) 10 MG tablet Prednisone 40 mg po daily x 2 day then Prednisone 30 mg po daily x 2 day then Prednisone 20 mg po daily x 2 day then Prednisone 10 mg daily x 2 day then stop...   No facility-administered encounter medications on file as of 03/27/2017.     Allergies as of 03/27/2017 - Review Complete 03/27/2017  Allergen Reaction Noted  . Byetta 10 mcg pen [exenatide] Diarrhea and Nausea And Vomiting 07/31/2012  . Codeine Itching  01/23/2017    Past Medical History:  Diagnosis Date  . Anginal pain ([place])   . Aortic stenosis    a. severe by echo 09/2014  . Atrial fibrillation ([place])    a. not well documented, not on anticoagulation  . CHF (congestive heart failure) ([place])   . Claustrophobia   . Complete heart block ([place])   . COPD (chronic obstructive pulmonary disease) ([place])   . Coronary artery disease    a. chronically occluded RCA per cath  09/2014 with collaterals  . CVA (cerebral vascular accident) ([place]) 10/2014   denies residual on 07/11/2015  . ESRD (end stage renal disease) on dialysis [facility])    a. on dialysis; [facility]; MWF, LUE fistula (07/11/2015)  . History of blood transfusion    "related to gallbladder OR"  . History of stomach ulcers   . Hyperlipidemia   . Hypertension   . Iron deficiency anemia   . Myocardial infarction ([place]) 10/2014  . Peripheral vascular disease ([place])   . Pneumonia   . Presence of permanent cardiac pacemaker   . Renal insufficiency   . 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 Type II diabetes mellitus ([place])     Past Surgical History:  Procedure Laterality Date  . AV FISTULA PLACEMENT Left 10/19/2014   Procedure: BRACHIOCEPHALIC ARTERIOVENOUS (AV) FISTULA CREATION ;  Surgeon: Conrad Slayton, MD;  Location: [facility];  Service: Vascular;  Laterality: Left;  . CARDIAC CATHETERIZATION    . CARDIAC CATHETERIZATION N/A 07/22/2015   Procedure: Right/Left Heart Cath and Coronary Angiography;  Surgeon: [name], MD;  Location: [facility] CV LAB;  Service: Cardiovascular;  Laterality: N/A;  . CATARACT EXTRACTION W/ INTRAOCULAR LENS  IMPLANT, BILATERAL Bilateral 1990's  . CHOLECYSTECTOMY OPEN  1980's  . COLONOSCOPY W/ BIOPSIES AND POLYPECTOMY    . CORONARY ANGIOPLASTY    . EP IMPLANTABLE DEVICE N/A 07/11/2015   Procedure: Pacemaker Implant;  Surgeon: [name], MD;  Location: [facility] CV LAB;  Service: Cardiovascular;  Laterality: N/A;  . ESOPHAGOGASTRODUODENOSCOPY  08/01/2012   Procedure: ESOPHAGOGASTRODUODENOSCOPY (EGD);  Surgeon: [name], MD;  Location: [facility];  Service: Endoscopy;  Laterality: N/A;  . INSERT / REPLACE / REMOVE PACEMAKER  07/11/2015  . INSERTION OF DIALYSIS CATHETER Right 02/02/2015   Procedure: INSERTION OF DIALYSIS CATHETER  RIGHT  INTERNAL JUGULAR;  Surgeon: Mal [name], MD;  Location: [facility];  Service: Vascular;  Laterality: Right;  . LEFT AND RIGHT HEART CATHETERIZATION WITH CORONARY ANGIOGRAM N/A 09/30/2014   Procedure: LEFT AND RIGHT HEART CATHETERIZATION WITH CORONARY ANGIOGRAM;  Surgeon: [name], MD;  Location: [facility] CATH LAB;  Service: Cardiovascular;  Laterality: N/A;  . TEE WITHOUT CARDIOVERSION N/A 08/02/2015   Procedure: TRANSESOPHAGEAL ECHOCARDIOGRAM (TEE);  Surgeon: [name], MD;  Location: [facility];  Service: Open Heart Surgery;  Laterality: N/A;  . TONSILLECTOMY    . TRANSCATHETER AORTIC VALVE REPLACEMENT, TRANSFEMORAL Left 08/02/2015   Procedure: TRANSCATHETER AORTIC VALVE REPLACEMENT, TRANSFEMORAL;  Surgeon: [name], MD;  Location: [facility];  Service: Open Heart Surgery;  Laterality: Left;    Family History  Problem Relation Age of Onset  . Diabetes Father   . Heart disease Father   . Diabetes Sister   . Alzheimer's disease Mother     Social History   Social History  . Marital status: Married    Spouse name:  N/A  . Number of children: N/A  . Years of education: N/A   Occupational History  . Not on file.   Social History Main Topics  . Smoking status: Former Smoker    Packs/day: 2.00    Years: 32.00    Quit date: 11/27/1983  . Smokeless tobacco: Never Used  . Alcohol use Yes     Comment: occ  . Drug use: No  . Sexual activity: Yes    Birth control/ protection: None   Other Topics Concern  . Not on file   Social History Narrative  . No narrative on file    Review of systems: Review of Systems  Constitutional: Negative for fever and chills.  HENT: Negative.   Eyes: Negative for blurred vision.  Respiratory: as per HPI  Cardiovascular: Negative for chest pain and palpitations.  Gastrointestinal: Negative for vomiting, diarrhea, blood per rectum. Genitourinary: Negative for dysuria, urgency, frequency and hematuria.  Musculoskeletal: Negative for myalgias, back pain  and joint pain.  Skin: Negative for itching and rash.  Neurological: Negative for dizziness, tremors, focal weakness, seizures and loss of consciousness.  Endo/Heme/Allergies: Negative for environmental allergies.  Psychiatric/Behavioral: Negative for depression, suicidal ideas and hallucinations.  All other systems reviewed and are negative.  Physical Exam: Blood pressure (!) 138/58, pulse 85, height 5\' 11"  (1.803 m), weight 231 lb 12.8 oz (105.1 kg), SpO2 96 %. Gen:      No acute distress HEENT:  EOMI, sclera anicteric Neck:     No masses; no thyromegaly Lungs:    Clear to auscultation bilaterally; normal respiratory effort CV:         Regular rate and rhythm; no murmurs Abd:      + bowel sounds; soft, non-tender; no palpable masses, no distension Ext:    No edema; adequate peripheral perfusion Skin:      Warm and dry; no rash Neuro: alert and oriented x 3 Psych: normal mood and affect  Data Reviewed: Respiratory virus panel 03/09/17- positive for parainfluenza  CT 07/21/15-moderate bilateral pleural effusion, passive atelectasis bilaterally, mild groundglass opacities suggestive of pulmonary edema. No pulmonary mass or nodule and flat. Chest x-ray 03/22/17-cardiomegaly, mild interstitial edema Reviewed all images personally.  Echo 07/19/16 - Normal LV size with moderate LV hypertrophy. EF 45-50% with   diffuse hypokinesis and septal-lateral dyssynchrony likely due to   pacing. Normal RV size and systolic function. Bioprosthetic   aortic valve s/p TAVR, trivial perivalvular regurgitation.  PFTs 07/20/15 FVC 2.28 (59%) FEV1 1.71 (61%) F/F 75 TLC 67% DLCO 53% Moderate obstruction with no bronchodilator response Moderate restriction with diffusion defect.  Assessment:  COPD GOLD D (cat score 37, several exacerbations) Poorly controlled after recent episode of exacerbation, parainfluenza infection. He is just on albuterol inhaler. I'll start him on Symbicort and give prescription  for duonebs to be used as needed for dyspnea. He continue using the albuterol as needed.  Asbestos exposure Reports asbestos exposure during his lifetime. There is no evidence of pulmonary fibrosis on prior imaging. We will continue to monitor.  Plan/Recommendations: - Symbicort 160/4.5 - Prescribed nebulizer, duonebs - Continue using the albuterol rescue inhaler.   Marshell Garfinkel MD [facility] Pager [phone] 03/27/2017, 3:49 PM  CC: [name], Dianna Rossetti, MD

## 2017-03-27 NOTE — Addendum Note (Signed)
Addended by: Maryanna Shape A on: 03/27/2017 04:36 PM   Modules accepted: Orders

## 2017-03-27 NOTE — Patient Instructions (Addendum)
He will start on symbicort inhaler 160/4.5 Give nebulizer machine and duo nebs every 6 hrs as needed for dyspnea Continue using the albuterol  Return in 3 months

## 2017-03-27 NOTE — Progress Notes (Signed)
Patient ID: Jeffrey [name], male   DOB: October 27, 1941, 76 y.o.   MRN: [id number] Patient seen in the office today and instructed on use of symbicort aerosol.  Patient expressed understanding and demonstrated technique.

## 2017-03-29 ENCOUNTER — Telehealth: Payer: Self-pay | Admitting: Pulmonary Disease

## 2017-03-29 MED ORDER — IPRATROPIUM-ALBUTEROL 0.5-2.5 (3) MG/3ML IN SOLN: 3.0000 mL | Freq: Four times a day (QID) | RESPIRATORY_TRACT | 6 refills | Status: DC

## 2017-03-29 NOTE — Telephone Encounter (Signed)
Called and spoke with pharmacy and they stated that the albuterol neb meds was sent in for prn and this cannot be on the rx due to medicare guidelines.  New rx has been sent in and nothing further is needed

## 2017-04-01 ENCOUNTER — Other Ambulatory Visit: Payer: Self-pay | Admitting: Internal Medicine

## 2017-04-02 ENCOUNTER — Telehealth: Payer: Self-pay | Admitting: Pulmonary Disease

## 2017-04-02 MED ORDER — IPRATROPIUM-ALBUTEROL 0.5-2.5 (3) MG/3ML IN SOLN: 3.0000 mL | Freq: Four times a day (QID) | RESPIRATORY_TRACT | 6 refills | Status: DC

## 2017-04-02 NOTE — Telephone Encounter (Signed)
Rx was sent to the wrong pharmacy. Canceled rx that was sent to [facility]. Resent rx to [facility]. Contacted pharmacy and made them aware that this was sent. They had no further questions. Nothing further is needed.

## 2017-04-15 ENCOUNTER — Telehealth: Payer: Self-pay | Admitting: Pulmonary Disease

## 2017-04-15 DIAGNOSIS — R0602 Shortness of breath: Secondary | ICD-10-CM

## 2017-04-15 NOTE — Telephone Encounter (Signed)
Please see if he can be seen as acute visit. Will need chest x ray before visit.  PM

## 2017-04-15 NOTE — Telephone Encounter (Signed)
PM  Please Advise-Sick Message  Pt called in c/o lots of coughing spells that leave him chocking. He states he has a hard time producing mucus but when he does it is greenish. He is c/o increase sob,lots of wheezing,chest tightness, Denies fever, he states he had to give himself a breathing treatment x 2 hours ago. He states when he went to dialysis they listened to his lungs and stated that the fluid build up in seems to be getting worse. Pt wants to know what should his next steps be.   Allergies  Allergen Reactions  . Byetta 10 Mcg Pen [Exenatide] Diarrhea and Nausea And Vomiting  . Codeine Itching   Current Outpatient Prescriptions on File Prior to Visit  Medication Sig Dispense Refill  . albuterol (PROVENTIL HFA;VENTOLIN HFA) 108 (90 Base) MCG/ACT inhaler Inhale into the lungs.    [name] amLODipine (NORVASC) 10 MG tablet Take 10 mg by mouth daily.     [name] aspirin EC 325 MG tablet Take 1 tablet (325 mg total) by mouth daily. 1 tablet 0  . budesonide-formoterol (SYMBICORT) 160-4.5 MCG/ACT inhaler Inhale 2 puffs into the lungs 2 (two) times daily. 1 Inhaler 0  . calcium carbonate (TUMS - DOSED IN MG ELEMENTAL CALCIUM) 500 MG chewable tablet Chew 1 tablet by mouth 4 (four) times daily as needed for indigestion or heartburn.    . cinacalcet (SENSIPAR) 30 MG tablet Take 30 mg by mouth daily with supper.    . Insulin Detemir (LEVEMIR FLEXTOUCH) 100 UNIT/ML Pen Inject 36 Units into the skin at bedtime.     [name] ipratropium-albuterol (DUONEB) 0.5-2.5 (3) MG/3ML SOLN Take 3 mLs by nebulization every 6 (six) hours. 360 mL 6  . lidocaine-prilocaine (EMLA) cream Apply 1 application topically once as needed (prior to accessing port).     . metoprolol tartrate (LOPRESSOR) 25 MG tablet Take 0.5 tablets (12.5 mg total) by mouth 2 (two) times daily. 60 tablet 6  . nitroGLYCERIN (NITROSTAT) 0.4 MG SL tablet Place 0.4 mg under the tongue every 5 (five) minutes as needed for chest pain.     [name] ondansetron (ZOFRAN ODT)  4 MG disintegrating tablet Take 1 tablet (4 mg total) by mouth every 8 (eight) hours as needed for nausea or vomiting. 20 tablet 0  . ondansetron (ZOFRAN) 8 MG tablet Take 8 mg by mouth daily as needed for nausea or vomiting.    . ranitidine (ZANTAC) 150 MG tablet Take 150 mg by mouth 2 (two) times daily as needed for heartburn.    . sevelamer carbonate (RENVELA) 800 MG tablet Take 2,400-3,200 mg by mouth See admin instructions. Take 4 capsules with meals and 3 with snacks     No current facility-administered medications on file prior to visit.

## 2017-04-15 NOTE — Telephone Encounter (Signed)
Pt has been scheduled for acute visit with MW on 04/16/17 at 9:00a with CXR piror. Pt's spouse is aware and voiced her understanding. Nothing further needed.

## 2017-04-16 ENCOUNTER — Ambulatory Visit (INDEPENDENT_AMBULATORY_CARE_PROVIDER_SITE_OTHER): Payer: Medicare Other | Admitting: Internal Medicine

## 2017-04-16 ENCOUNTER — Ambulatory Visit (INDEPENDENT_AMBULATORY_CARE_PROVIDER_SITE_OTHER)
Admission: RE | Admit: 2017-04-16 | Discharge: 2017-04-16 | Disposition: A | Discharge status: Home / Self Care | Payer: Medicare Other | Source: Ambulatory Visit | Attending: Pulmonary Disease | Admitting: Pulmonary Disease

## 2017-04-16 ENCOUNTER — Encounter: Payer: Self-pay | Admitting: Internal Medicine

## 2017-04-16 VITALS — BP 120/58 | HR 72 | Ht 71.0 in | Wt 230.0 lb

## 2017-04-16 DIAGNOSIS — J441 Chronic obstructive pulmonary disease with (acute) exacerbation: Secondary | ICD-10-CM

## 2017-04-16 DIAGNOSIS — R0602 Shortness of breath: Secondary | ICD-10-CM | POA: Diagnosis not present

## 2017-04-16 MED ORDER — CEFDINIR 300 MG PO CAPS: 300.0000 mg | ORAL_CAPSULE | Freq: Two times a day (BID) | ORAL | 0 refills | Status: DC

## 2017-04-16 MED ORDER — FLUTTER DEVI: 0 refills | Status: DC

## 2017-04-16 MED ORDER — BUDESONIDE-FORMOTEROL FUMARATE 160-4.5 MCG/ACT IN AERO
2.0000 | INHALATION_SPRAY | Freq: Two times a day (BID) | RESPIRATORY_TRACT | 11 refills | Status: DC
Start: 2017-04-16 — End: 2017-05-03

## 2017-04-16 NOTE — Progress Notes (Signed)
Jeffrey [name]    [id number]       Primary Care Physician:Jeffrey L, MD  Referring Physician: Thurman Coyer, MD [address] [place], [place] [address]  Chief complaint:  Consult for evaluation, management of COPD  HPI: 23-yowm obese s/p smoking cessation 1985  with history of end-stage renal disease on hemodialysis, COPD GOLD 0    diabetes mellitus, aortic stenosis s/p TAVR, ICD implantation. He was admitted in April 2018 for acute respiratory failure, COPD exacerbation in setting of parainfluenza virus. He was treated with Solu-Medrol, doonebs, Mucinex, doxycycline with improvement in symptoms.   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 Smoking history: 60 pack year smoking history. Quit in 1983.     04/16/2017 acute extended ov/Jeffrey [name] re:  aecopd / GOLD 0 criteria previously  Chief Complaint  Patient presents with  . Acute Visit    Pt states "lung full of congestion"- increased SOB x 2 months. He states also coughing more and cough was prod last night after he used symbicort with green sputum.  He has only been using symbicort every 4 hours and not using his albuterol inhaler. He uses neb once per day on average.   downhill since transient improvement p d/c from hospital 2 m prior to OV   nausea is 24/7  symb over use (up to every 4 hours) and extremely poor insight into meds  HD mwf  No better with prednisone in past  Cough worse in am's, better p neb, green mucus mostly in ams Sob at rest between treatments and no better before vs after HD    No obvious patterns in day to day or daytime variability or   mucus plugs or hemoptysis or cp or chest tightness, subjective wheeze or overt sinus or hb symptoms. No unusual exp hx or h/o childhood pna/ asthma or knowledge of premature birth.   Also denies any obvious fluctuation of symptoms with weather  or environmental changes or other aggravating or alleviating factors except as outlined above   Current Medications, Allergies, Complete Past Medical History, Past Surgical History, Family History, and Social History were reviewed in [facility] record.  ROS  The following are not active complaints unless bolded sore throat, dysphagia, dental problems, itching, sneezing,  nasal congestion or excess/ purulent secretions, ear ache,   fever, chills, sweats, unintended wt loss, classically pleuritic or exertional cp,  orthopnea pnd or leg swelling, presyncope, palpitations, abdominal pain, anorexia, nausea, vomiting, diarrhea  or change in bowel or bladder habits, change in stools or urine, dysuria,hematuria,  rash, arthralgias, visual complaints, headache, numbness, weakness or ataxia or problems with walking or coordination,  change in mood/affect or memory.              Physical Exam:   W/c bound elderly wm nad at rest  Wt Readings from Last 3 Encounters:  04/16/17 230 lb (104.3 kg)  03/27/17 231 lb 12.8 oz (105.1 kg)  03/13/17 231 lb 7.7 oz (105 kg)    Vital signs reviewed  - Note on arrival 02 sats  93% on RA      HEENT: nl dentition, turbinates bilaterally, and oropharynx. Nl external ear canals without cough reflex   NECK :  without JVD/Nodes/TM/ nl carotid upstrokes bilaterally   LUNGS: no acc muscle use,  Nl contour chest  With junky  insp and exp rhonchi  bilaterally   CV:  RRR  no s3 o-  II/VI sem with no increase in P2, and 1+ pitting bilateral lower ext  edema   ABD:  soft and nontender with nl inspiratory excursion in the supine position. No bruits or organomegaly appreciated, bowel sounds nl  MS:  Nl gait/ ext warm without deformities, calf tenderness, cyanosis or clubbing No obvious joint restrictions   SKIN: warm and dry without lesions    NEURO:  alert, approp, nl sensorium with  no motor or cerebellar deficits apparent.       Data  Reviewed: Respiratory virus panel 03/09/17- positive for parainfluenza  CT 07/21/15-moderate bilateral pleural effusion, passive atelectasis bilaterally, mild groundglass opacities suggestive of pulmonary edema. No pulmonary mass or nodule and flat. Chest x-ray 03/22/17-cardiomegaly, mild interstitial edema Reviewed all images personally.  Echo 07/19/16 - Normal LV size with moderate LV hypertrophy. EF 45-50% with   diffuse hypokinesis and septal-lateral dyssynchrony likely due to   pacing. Normal RV size and systolic function. Bioprosthetic   aortic valve s/p TAVR, trivial perivalvular regurgitation.  PFTs 07/20/15 FVC 2.28 (59%) FEV1 1.71 (61%) F/F 75 TLC 67% DLCO 53%    CXR PA and Lateral:   04/16/2017 :    I personally reviewed images and agree with radiology impression as follows:   Cardiac shadow is mildly enlarged. Changes consistent withTAVR are seen. Pacing device is again noted and stable. Aortic calcifications are seen. The lungs are well aerated bilaterally. Some minimal atelectatic changes are noted in the bases bilaterally. No bony abnormality is seen.     Assessment:

## 2017-04-16 NOTE — Patient Instructions (Addendum)
Change Zantac 150 mg in am (after dialysis) and one at bedtime   omnicef 300 mg twice daily x 7 days   For cough > mucinex dm 1200 mg every 12 hours as needed and use the flutter valve as much as possible  Plan A = Automatic = Symbicort 160 Take 2 puffs first thing in am and then another 2 puffs about 12 hours later.     Plan B = Backup Only use your albuterol (proventil) as a rescue medication to be used if you can't catch your breath by resting or doing a relaxed purse lip breathing pattern.  - The less you use it, the better it will work when you need it. - Ok to use the inhaler up to 2 puffs  every 4 hours if you must but call for appointment if use goes up over your usual need - Don't leave home without it !!  (think of it like the spare tire for your car)   Plan C = Crisis -   use your albuterol/ipatropium nebulizer if you first try Plan B and it fails to help > ok to use the nebulizer up to every 4 hours but if start needing it regularly call for immediate appointment  See Tammy NP w/in 2 weeks with all your medications, even over the counter meds, separated in two separate bags, the ones you take no matter what vs the ones you stop once you feel better and take only as needed when you feel you need them.   Tammy  will generate for you a new user friendly medication calendar that will put [place] all on the same page re: your medication use.     Without this process, it simply isn't possible to assure that we are providing  your outpatient care  with  the attention to detail we feel you deserve.   If we cannot assure that you're getting that kind of care,  then we cannot manage your problem effectively from this clinic.  Once you have seen Tammy and we are sure that we're all on the same page with your medication use she will arrange follow up with me.

## 2017-04-17 DIAGNOSIS — E669 Obesity, unspecified: Secondary | ICD-10-CM | POA: Insufficient documentation

## 2017-04-17 NOTE — Assessment & Plan Note (Signed)
Body mass index is 32.08 kg/m.  -  trending no change despite reported nausea x 2 months Lab Results  Component Value Date   TSH 1.422 04/27/2015     Contributing to gerd risk/ doe/reviewed the need and the process to achieve and maintain neg calorie balance > defer f/u primary care including intermittently monitoring thyroid status

## 2017-04-17 NOTE — Assessment & Plan Note (Addendum)
PFTs 07/20/15 FVC 2.28 (59%) FEV1 1.71 (61%) F/F 75 TLC 67% DLCO 53%  So only GOLD 0 but with symptoms very difficult to control ? Why?   DDX of  difficult airways management almost all start with A and  include Adherence, Ace Inhibitors, Acid Reflux, Active Sinus Disease, Alpha 1 Antitripsin deficiency, Anxiety masquerading as Airways dz,  ABPA,  Allergy(esp in young), Aspiration (esp in elderly), Adverse effects of meds,  Active smokers, A bunch of PE's (a small clot burden can't cause this syndrome unless there is already severe underlying pulm or vascular dz with poor reserve) plus two Bs  = Bronchiectasis and Beta blocker use..and one C= CHF   Adherence is always the initial "prime suspect" and is a multilayered concern that requires a "trust but verify" approach in every patient - starting with knowing how to use medications, especially inhalers, correctly, keeping up with refills and understanding the fundamental difference between maintenance and prns vs those medications only taken for a very short course and then stopped and not refilled.  - very poor hfa baseline  - 04/16/2017  After extensive coaching HFA effectiveness =    75%  - needs to return with all meds in hand using a trust but verify approach to confirm accurate Medication  Reconciliation The principal here is that until we are certain that the  patients are doing what we've asked, it makes no sense to ask them to do more.  - desperately needs accurate/ meaningful med reconciliation:  To keep things simple, I have asked the patient to first separate medicines that are perceived as maintenance, that is to be taken daily "no matter what", from those medicines that are taken on only on an as-needed basis and I have given the patient examples of both, and then return to see our NP to generate a  detailed  medication calendar which should be followed until the next physician sees the patient and updates it.    ? Acid (or non-acid)  GERD > always difficult to exclude as up to 75% of pts in some series report no assoc GI/ Heartburn symptoms> rec max (24h)  h2 suppression and diet restrictions/ reviewed and instructions given in writing - low threshold to change to ppi bid ac if not improving  ? Active sinus dz > ominicef and f/u with sinus CT  ? Asp:  DgES 2013:  1.  Repeated  flash laryngeal penetration with thin liquids. 2.  Slight irregularity of the anterior aspect of the cervical esophagus at the C5-6 level, which could represent a mucosal lesion. 3.  Occasional spasms in the cervical and thoracic esophagus. -probably needs to be repeated   ? Allergy/ asthma > symb 160 2bid should be adequate if uses correctly   ? Bronchiectasis > f/u with hrct next if not improving  / in meantime add max dose mucinex/ flutter valve  ? BB effects:  Should be ok on low dose lopressor   ? chf > no recent echo but previous study was abnormal, may need to repeat if not improving with airways rx / in meantime keep as dry as tol on HD      I had an extended discussion with the patient reviewing all relevant studies completed to date and  lasting 25 minutes of a 40  minute acute visit with pt not previously known to me     re  severe non-specific but potentially very serious refractory respiratory symptoms of uncertain and potentially multiple  etiologies.   Each maintenance medication was reviewed in detail including most importantly the difference between maintenance and prns and under what circumstances the prns are to be triggered using an action plan format that is not reflected in the computer generated alphabetically organized AVS.    Please see AVS for specific instructions unique to this office visit that I personally wrote and verbalized to the the pt in detail and then reviewed with pt  by my nurse highlighting any changes in therapy/plan of care  recommended at today's visit.

## 2017-04-27 ENCOUNTER — Inpatient Hospital Stay (HOSPITAL_COMMUNITY)
Admission: EM | Admit: 2017-04-27 | Discharge: 2017-05-03 | DRG: 280 | Disposition: A | Discharge status: Home / Self Care | Payer: Medicare Other | Attending: Internal Medicine | Admitting: Internal Medicine

## 2017-04-27 ENCOUNTER — Emergency Department (HOSPITAL_COMMUNITY): Payer: Medicare Other

## 2017-04-27 ENCOUNTER — Encounter (HOSPITAL_COMMUNITY): Payer: Self-pay | Admitting: Emergency Medicine

## 2017-04-27 DIAGNOSIS — E1122 Type 2 diabetes mellitus with diabetic chronic kidney disease: Secondary | ICD-10-CM | POA: Diagnosis present

## 2017-04-27 DIAGNOSIS — E8779 Other fluid overload: Secondary | ICD-10-CM

## 2017-04-27 DIAGNOSIS — Z952 Presence of prosthetic heart valve: Secondary | ICD-10-CM

## 2017-04-27 DIAGNOSIS — E784 Other hyperlipidemia: Secondary | ICD-10-CM | POA: Diagnosis not present

## 2017-04-27 DIAGNOSIS — I200 Unstable angina: Secondary | ICD-10-CM

## 2017-04-27 DIAGNOSIS — J189 Pneumonia, unspecified organism: Secondary | ICD-10-CM | POA: Diagnosis present

## 2017-04-27 DIAGNOSIS — E785 Hyperlipidemia, unspecified: Secondary | ICD-10-CM | POA: Diagnosis present

## 2017-04-27 DIAGNOSIS — D631 Anemia in chronic kidney disease: Secondary | ICD-10-CM | POA: Diagnosis present

## 2017-04-27 DIAGNOSIS — E1151 Type 2 diabetes mellitus with diabetic peripheral angiopathy without gangrene: Secondary | ICD-10-CM | POA: Diagnosis present

## 2017-04-27 DIAGNOSIS — N2581 Secondary hyperparathyroidism of renal origin: Secondary | ICD-10-CM | POA: Diagnosis present

## 2017-04-27 DIAGNOSIS — Z833 Family history of diabetes mellitus: Secondary | ICD-10-CM

## 2017-04-27 DIAGNOSIS — I350 Nonrheumatic aortic (valve) stenosis: Secondary | ICD-10-CM | POA: Diagnosis present

## 2017-04-27 DIAGNOSIS — Z794 Long term (current) use of insulin: Secondary | ICD-10-CM

## 2017-04-27 DIAGNOSIS — J441 Chronic obstructive pulmonary disease with (acute) exacerbation: Secondary | ICD-10-CM | POA: Diagnosis present

## 2017-04-27 DIAGNOSIS — Z8249 Family history of ischemic heart disease and other diseases of the circulatory system: Secondary | ICD-10-CM | POA: Diagnosis not present

## 2017-04-27 DIAGNOSIS — Z992 Dependence on renal dialysis: Secondary | ICD-10-CM | POA: Diagnosis not present

## 2017-04-27 DIAGNOSIS — I2510 Atherosclerotic heart disease of native coronary artery without angina pectoris: Secondary | ICD-10-CM | POA: Diagnosis not present

## 2017-04-27 DIAGNOSIS — R0789 Other chest pain: Secondary | ICD-10-CM | POA: Diagnosis present

## 2017-04-27 DIAGNOSIS — I442 Atrioventricular block, complete: Secondary | ICD-10-CM | POA: Diagnosis not present

## 2017-04-27 DIAGNOSIS — Z7982 Long term (current) use of aspirin: Secondary | ICD-10-CM

## 2017-04-27 DIAGNOSIS — Z9981 Dependence on supplemental oxygen: Secondary | ICD-10-CM

## 2017-04-27 DIAGNOSIS — I272 Pulmonary hypertension, unspecified: Secondary | ICD-10-CM | POA: Diagnosis present

## 2017-04-27 DIAGNOSIS — Z8673 Personal history of transient ischemic attack (TIA), and cerebral infarction without residual deficits: Secondary | ICD-10-CM

## 2017-04-27 DIAGNOSIS — I132 Hypertensive heart and chronic kidney disease with heart failure and with stage 5 chronic kidney disease, or end stage renal disease: Secondary | ICD-10-CM | POA: Diagnosis present

## 2017-04-27 DIAGNOSIS — Z95810 Presence of automatic (implantable) cardiac defibrillator: Secondary | ICD-10-CM

## 2017-04-27 DIAGNOSIS — G609 Hereditary and idiopathic neuropathy, unspecified: Secondary | ICD-10-CM | POA: Diagnosis present

## 2017-04-27 DIAGNOSIS — F40240 Claustrophobia: Secondary | ICD-10-CM | POA: Diagnosis present

## 2017-04-27 DIAGNOSIS — R7989 Other specified abnormal findings of blood chemistry: Secondary | ICD-10-CM

## 2017-04-27 DIAGNOSIS — Z6832 Body mass index (BMI) 32.0-32.9, adult: Secondary | ICD-10-CM

## 2017-04-27 DIAGNOSIS — E8770 Fluid overload, unspecified: Secondary | ICD-10-CM | POA: Diagnosis not present

## 2017-04-27 DIAGNOSIS — I25119 Atherosclerotic heart disease of native coronary artery with unspecified angina pectoris: Secondary | ICD-10-CM | POA: Diagnosis present

## 2017-04-27 DIAGNOSIS — Z79899 Other long term (current) drug therapy: Secondary | ICD-10-CM

## 2017-04-27 DIAGNOSIS — I5043 Acute on chronic combined systolic (congestive) and diastolic (congestive) heart failure: Secondary | ICD-10-CM | POA: Diagnosis present

## 2017-04-27 DIAGNOSIS — R748 Abnormal levels of other serum enzymes: Secondary | ICD-10-CM | POA: Diagnosis not present

## 2017-04-27 DIAGNOSIS — I25110 Atherosclerotic heart disease of native coronary artery with unstable angina pectoris: Secondary | ICD-10-CM | POA: Diagnosis not present

## 2017-04-27 DIAGNOSIS — N186 End stage renal disease: Secondary | ICD-10-CM | POA: Diagnosis present

## 2017-04-27 DIAGNOSIS — E662 Morbid (severe) obesity with alveolar hypoventilation: Secondary | ICD-10-CM | POA: Diagnosis present

## 2017-04-27 DIAGNOSIS — I5042 Chronic combined systolic (congestive) and diastolic (congestive) heart failure: Secondary | ICD-10-CM | POA: Diagnosis present

## 2017-04-27 DIAGNOSIS — I4891 Unspecified atrial fibrillation: Secondary | ICD-10-CM | POA: Diagnosis present

## 2017-04-27 DIAGNOSIS — J9621 Acute and chronic respiratory failure with hypoxia: Secondary | ICD-10-CM | POA: Diagnosis present

## 2017-04-27 DIAGNOSIS — I255 Ischemic cardiomyopathy: Secondary | ICD-10-CM | POA: Diagnosis present

## 2017-04-27 DIAGNOSIS — E871 Hypo-osmolality and hyponatremia: Secondary | ICD-10-CM | POA: Diagnosis present

## 2017-04-27 DIAGNOSIS — J440 Chronic obstructive pulmonary disease with acute lower respiratory infection: Secondary | ICD-10-CM | POA: Diagnosis present

## 2017-04-27 DIAGNOSIS — I214 Non-ST elevation (NSTEMI) myocardial infarction: Principal | ICD-10-CM | POA: Diagnosis present

## 2017-04-27 DIAGNOSIS — Z8711 Personal history of peptic ulcer disease: Secondary | ICD-10-CM

## 2017-04-27 DIAGNOSIS — J810 Acute pulmonary edema: Secondary | ICD-10-CM | POA: Diagnosis not present

## 2017-04-27 DIAGNOSIS — R778 Other specified abnormalities of plasma proteins: Secondary | ICD-10-CM | POA: Diagnosis present

## 2017-04-27 DIAGNOSIS — I10 Essential (primary) hypertension: Secondary | ICD-10-CM

## 2017-04-27 DIAGNOSIS — Z7951 Long term (current) use of inhaled steroids: Secondary | ICD-10-CM

## 2017-04-27 DIAGNOSIS — J181 Lobar pneumonia, unspecified organism: Secondary | ICD-10-CM | POA: Diagnosis not present

## 2017-04-27 DIAGNOSIS — Z87891 Personal history of nicotine dependence: Secondary | ICD-10-CM

## 2017-04-27 DIAGNOSIS — I361 Nonrheumatic tricuspid (valve) insufficiency: Secondary | ICD-10-CM | POA: Diagnosis not present

## 2017-04-27 DIAGNOSIS — E11649 Type 2 diabetes mellitus with hypoglycemia without coma: Secondary | ICD-10-CM

## 2017-04-27 DIAGNOSIS — E8889 Other specified metabolic disorders: Secondary | ICD-10-CM | POA: Diagnosis present

## 2017-04-27 DIAGNOSIS — E669 Obesity, unspecified: Secondary | ICD-10-CM | POA: Diagnosis present

## 2017-04-27 LAB — HEPATIC FUNCTION PANEL
ALBUMIN: 3.1 g/dL — AB (ref 3.5–5.0)
ALK PHOS: 98 U/L (ref 38–126)
ALT: 12 U/L — AB (ref 17–63)
AST: 20 U/L (ref 15–41)
Bilirubin, Direct: 0.2 mg/dL (ref 0.1–0.5)
Indirect Bilirubin: 1 mg/dL — ABNORMAL HIGH (ref 0.3–0.9)
TOTAL PROTEIN: 7 g/dL (ref 6.5–8.1)
Total Bilirubin: 1.2 mg/dL (ref 0.3–1.2)

## 2017-04-27 LAB — I-STAT ARTERIAL BLOOD GAS, ED
BICARBONATE: 24.9 mmol/L (ref 20.0–28.0)
O2 SAT: 87 %
PO2 ART: 53 mmHg — AB (ref 83.0–108.0)
TCO2: 26 mmol/L (ref 0–100)
pCO2 arterial: 39.5 mmHg (ref 32.0–48.0)
pH, Arterial: 7.407 (ref 7.350–7.450)

## 2017-04-27 LAB — CBC
HEMATOCRIT: 31 % — AB (ref 39.0–52.0)
Hemoglobin: 10 g/dL — ABNORMAL LOW (ref 13.0–17.0)
MCH: 30.7 pg (ref 26.0–34.0)
MCHC: 32.3 g/dL (ref 30.0–36.0)
MCV: 95.1 fL (ref 78.0–100.0)
Platelets: 401 10*3/uL — ABNORMAL HIGH (ref 150–400)
RBC: 3.26 MIL/uL — ABNORMAL LOW (ref 4.22–5.81)
RDW: 16.7 % — ABNORMAL HIGH (ref 11.5–15.5)
WBC: 20.8 10*3/uL — AB (ref 4.0–10.5)

## 2017-04-27 LAB — I-STAT TROPONIN, ED: TROPONIN I, POC: 0.13 ng/mL — AB (ref 0.00–0.08)

## 2017-04-27 LAB — CBG MONITORING, ED
Glucose-Capillary: 402 mg/dL — ABNORMAL HIGH (ref 65–99)
Glucose-Capillary: 468 mg/dL — ABNORMAL HIGH (ref 65–99)
Glucose-Capillary: 369 mg/dL — ABNORMAL HIGH (ref 65–99)

## 2017-04-27 LAB — BRAIN NATRIURETIC PEPTIDE: B Natriuretic Peptide: 1193.6 pg/mL — ABNORMAL HIGH (ref 0.0–100.0)

## 2017-04-27 LAB — BASIC METABOLIC PANEL
ANION GAP: 12 (ref 5–15)
BUN: 44 mg/dL — ABNORMAL HIGH (ref 6–20)
CO2: 26 mmol/L (ref 22–32)
Calcium: 8.6 mg/dL — ABNORMAL LOW (ref 8.9–10.3)
Chloride: 95 mmol/L — ABNORMAL LOW (ref 101–111)
Creatinine, Ser: 3.76 mg/dL — ABNORMAL HIGH (ref 0.61–1.24)
GFR calc Af Amer: 16 mL/min — ABNORMAL LOW (ref >60)
GFR, EST NON AFRICAN AMERICAN: 14 mL/min — AB (ref >60)
Glucose, Bld: 436 mg/dL — ABNORMAL HIGH (ref 65–99)
POTASSIUM: 4.7 mmol/L (ref 3.5–5.1)
SODIUM: 133 mmol/L — AB (ref 135–145)

## 2017-04-27 LAB — GLUCOSE, CAPILLARY: Glucose-Capillary: 275 mg/dL — ABNORMAL HIGH (ref 65–99)

## 2017-04-27 LAB — TROPONIN I: Troponin I: 1.44 ng/mL (ref <0.03)

## 2017-04-27 MED ORDER — LEVOFLOXACIN IN D5W 750 MG/150ML IV SOLN
750.0000 mg | Freq: Once | INTRAVENOUS | Status: DC
  Filled 2017-04-27: qty 150

## 2017-04-27 MED ORDER — HEPARIN SODIUM (PORCINE) 1000 UNIT/ML DIALYSIS: 40.0000 [IU]/kg | INTRAMUSCULAR | Status: DC | PRN

## 2017-04-27 MED ORDER — SODIUM CHLORIDE 0.9 % IV SOLN: 100.0000 mL | INTRAVENOUS | Status: DC | PRN

## 2017-04-27 MED ORDER — ACETAMINOPHEN 325 MG PO TABS: 650.0000 mg | ORAL_TABLET | Freq: Four times a day (QID) | ORAL | Status: DC | PRN

## 2017-04-27 MED ORDER — FAMOTIDINE 20 MG PO TABS
20.0000 mg | ORAL_TABLET | Freq: Two times a day (BID) | ORAL | Status: DC
  Administered 2017-04-27 – 2017-04-28 (×2): 20 mg via ORAL
  Filled 2017-04-27 (×2): qty 1

## 2017-04-27 MED ORDER — MOMETASONE FURO-FORMOTEROL FUM 200-5 MCG/ACT IN AERO
2.0000 | INHALATION_SPRAY | Freq: Two times a day (BID) | RESPIRATORY_TRACT | Status: DC
  Administered 2017-04-28 – 2017-05-02 (×9): 2 via RESPIRATORY_TRACT
  Filled 2017-04-27: qty 8.8

## 2017-04-27 MED ORDER — SODIUM CHLORIDE 0.9 % IV SOLN: 250.0000 mL | INTRAVENOUS | Status: DC | PRN

## 2017-04-27 MED ORDER — METHYLPREDNISOLONE SODIUM SUCC 125 MG IJ SOLR
125.0000 mg | Freq: Once | INTRAMUSCULAR | Status: AC
Start: 2017-04-27 — End: 2017-04-27
  Administered 2017-04-27: 125 mg via INTRAVENOUS
  Filled 2017-04-27: qty 2

## 2017-04-27 MED ORDER — ACETAMINOPHEN 650 MG RE SUPP: 650.0000 mg | Freq: Four times a day (QID) | RECTAL | Status: DC | PRN

## 2017-04-27 MED ORDER — INSULIN ASPART 100 UNIT/ML ~~LOC~~ SOLN: 0.0000 [IU] | Freq: Three times a day (TID) | SUBCUTANEOUS | Status: DC

## 2017-04-27 MED ORDER — SODIUM CHLORIDE 0.9% FLUSH: 3.0000 mL | Freq: Two times a day (BID) | INTRAVENOUS | Status: DC

## 2017-04-27 MED ORDER — LIDOCAINE HCL (PF) 1 % IJ SOLN: 5.0000 mL | INTRAMUSCULAR | Status: DC | PRN

## 2017-04-27 MED ORDER — CINACALCET HCL 30 MG PO TABS
30.0000 mg | ORAL_TABLET | Freq: Every day | ORAL | Status: DC
  Administered 2017-04-28 – 2017-05-03 (×6): 30 mg via ORAL
  Filled 2017-04-27 (×7): qty 1

## 2017-04-27 MED ORDER — ONDANSETRON HCL 4 MG/2ML IJ SOLN: 4.0000 mg | Freq: Four times a day (QID) | INTRAMUSCULAR | Status: DC | PRN

## 2017-04-27 MED ORDER — SODIUM CHLORIDE 0.9% FLUSH
3.0000 mL | Freq: Two times a day (BID) | INTRAVENOUS | Status: DC
  Administered 2017-04-27 – 2017-05-02 (×7): 3 mL via INTRAVENOUS

## 2017-04-27 MED ORDER — HEPARIN (PORCINE) IN NACL 100-0.45 UNIT/ML-% IJ SOLN
1500.0000 [IU]/h | INTRAMUSCULAR | Status: DC
  Administered 2017-04-27 – 2017-04-30 (×4): 1400 [IU]/h via INTRAVENOUS
  Administered 2017-04-30: 1600 [IU]/h via INTRAVENOUS
  Filled 2017-04-27 (×7): qty 250

## 2017-04-27 MED ORDER — INSULIN DETEMIR 100 UNIT/ML ~~LOC~~ SOLN
36.0000 [IU] | Freq: Every day | SUBCUTANEOUS | Status: DC
  Administered 2017-04-27: 36 [IU] via SUBCUTANEOUS
  Filled 2017-04-27 (×3): qty 0.36

## 2017-04-27 MED ORDER — NITROGLYCERIN 0.4 MG SL SUBL
0.4000 mg | SUBLINGUAL_TABLET | SUBLINGUAL | Status: DC | PRN
  Administered 2017-04-27 – 2017-04-28 (×2): 0.4 mg via SUBLINGUAL
  Filled 2017-04-27: qty 1

## 2017-04-27 MED ORDER — IPRATROPIUM-ALBUTEROL 0.5-2.5 (3) MG/3ML IN SOLN
3.0000 mL | Freq: Three times a day (TID) | RESPIRATORY_TRACT | Status: DC
  Administered 2017-04-28 – 2017-05-02 (×11): 3 mL via RESPIRATORY_TRACT
  Filled 2017-04-27 (×12): qty 3

## 2017-04-27 MED ORDER — ALBUTEROL SULFATE (2.5 MG/3ML) 0.083% IN NEBU: 2.5000 mg | INHALATION_SOLUTION | RESPIRATORY_TRACT | Status: DC | PRN

## 2017-04-27 MED ORDER — LIDOCAINE-PRILOCAINE 2.5-2.5 % EX CREA: 1.0000 "application " | TOPICAL_CREAM | Freq: Once | CUTANEOUS | Status: DC | PRN

## 2017-04-27 MED ORDER — PENTAFLUOROPROP-TETRAFLUOROETH EX AERO: 1.0000 "application " | INHALATION_SPRAY | CUTANEOUS | Status: DC | PRN

## 2017-04-27 MED ORDER — ALBUTEROL SULFATE (2.5 MG/3ML) 0.083% IN NEBU
5.0000 mg | INHALATION_SOLUTION | Freq: Once | RESPIRATORY_TRACT | Status: AC
  Administered 2017-04-27: 5 mg via RESPIRATORY_TRACT
  Filled 2017-04-27: qty 6

## 2017-04-27 MED ORDER — INSULIN ASPART 100 UNIT/ML ~~LOC~~ SOLN
0.0000 [IU] | Freq: Every day | SUBCUTANEOUS | Status: DC
  Administered 2017-04-27: 3 [IU] via SUBCUTANEOUS

## 2017-04-27 MED ORDER — ALTEPLASE 2 MG IJ SOLR: 2.0000 mg | Freq: Once | INTRAMUSCULAR | Status: DC | PRN

## 2017-04-27 MED ORDER — AMLODIPINE BESYLATE 5 MG PO TABS
10.0000 mg | ORAL_TABLET | Freq: Every day | ORAL | Status: DC
  Administered 2017-04-28: 10 mg via ORAL
  Filled 2017-04-27 (×2): qty 2

## 2017-04-27 MED ORDER — ASPIRIN EC 325 MG PO TBEC
325.0000 mg | DELAYED_RELEASE_TABLET | Freq: Every day | ORAL | Status: DC
  Administered 2017-04-28 – 2017-04-30 (×3): 325 mg via ORAL
  Filled 2017-04-27 (×4): qty 1

## 2017-04-27 MED ORDER — ONDANSETRON HCL 4 MG PO TABS
4.0000 mg | ORAL_TABLET | Freq: Four times a day (QID) | ORAL | Status: DC | PRN
  Filled 2017-04-27: qty 1

## 2017-04-27 MED ORDER — INSULIN ASPART 100 UNIT/ML ~~LOC~~ SOLN
10.0000 [IU] | Freq: Once | SUBCUTANEOUS | Status: AC
  Administered 2017-04-27: 10 [IU] via INTRAVENOUS
  Filled 2017-04-27: qty 1

## 2017-04-27 MED ORDER — HEPARIN BOLUS VIA INFUSION
3000.0000 [IU] | Freq: Once | INTRAVENOUS | Status: AC
  Administered 2017-04-27: 3000 [IU] via INTRAVENOUS
  Filled 2017-04-27: qty 3000

## 2017-04-27 MED ORDER — SEVELAMER CARBONATE 800 MG PO TABS
3200.0000 mg | ORAL_TABLET | Freq: Three times a day (TID) | ORAL | Status: DC
  Administered 2017-04-28 – 2017-05-03 (×13): 3200 mg via ORAL
  Filled 2017-04-27 (×13): qty 4

## 2017-04-27 MED ORDER — HEPARIN SODIUM (PORCINE) 1000 UNIT/ML DIALYSIS: 1000.0000 [IU] | INTRAMUSCULAR | Status: DC | PRN

## 2017-04-27 MED ORDER — SODIUM CHLORIDE 0.9% FLUSH: 3.0000 mL | INTRAVENOUS | Status: DC | PRN

## 2017-04-27 MED ORDER — CALCIUM CARBONATE ANTACID 500 MG PO CHEW
1.0000 | CHEWABLE_TABLET | Freq: Four times a day (QID) | ORAL | Status: DC | PRN
  Administered 2017-04-28 (×2): 200 mg via ORAL
  Filled 2017-04-27 (×2): qty 1

## 2017-04-27 MED ORDER — NITROGLYCERIN IN D5W 200-5 MCG/ML-% IV SOLN
10.0000 ug/min | INTRAVENOUS | Status: DC
  Administered 2017-04-27: 10 ug/min via INTRAVENOUS
  Filled 2017-04-27: qty 250

## 2017-04-27 MED ORDER — LEVOFLOXACIN IN D5W 500 MG/100ML IV SOLN: 500.0000 mg | INTRAVENOUS | Status: DC

## 2017-04-27 MED ORDER — MORPHINE SULFATE (PF) 4 MG/ML IV SOLN
2.0000 mg | Freq: Once | INTRAVENOUS | Status: AC
  Administered 2017-04-27: 2 mg via INTRAVENOUS
  Filled 2017-04-27: qty 1

## 2017-04-27 MED ORDER — LIDOCAINE-PRILOCAINE 2.5-2.5 % EX CREA: 1.0000 "application " | TOPICAL_CREAM | CUTANEOUS | Status: DC | PRN

## 2017-04-27 MED ORDER — METOPROLOL TARTRATE 12.5 MG HALF TABLET
12.5000 mg | ORAL_TABLET | Freq: Two times a day (BID) | ORAL | Status: DC
  Administered 2017-04-27 – 2017-04-30 (×5): 12.5 mg via ORAL
  Filled 2017-04-27 (×6): qty 1

## 2017-04-27 MED ORDER — ATORVASTATIN CALCIUM 40 MG PO TABS
40.0000 mg | ORAL_TABLET | Freq: Every day | ORAL | Status: DC
  Administered 2017-04-27 – 2017-05-01 (×5): 40 mg via ORAL
  Filled 2017-04-27 (×5): qty 1

## 2017-04-27 MED ORDER — MORPHINE SULFATE (PF) 4 MG/ML IV SOLN
4.0000 mg | INTRAVENOUS | Status: DC | PRN
  Administered 2017-04-28: 4 mg via INTRAVENOUS
  Filled 2017-04-27: qty 1

## 2017-04-27 MED ORDER — IPRATROPIUM-ALBUTEROL 0.5-2.5 (3) MG/3ML IN SOLN: 3.0000 mL | Freq: Four times a day (QID) | RESPIRATORY_TRACT | Status: DC

## 2017-04-27 NOTE — ED Triage Notes (Signed)
Pt c/o chest pain-- has taken 3 NTG at home, states pain in jaw and gums also-- states was in the hospital in [place] last weekend--

## 2017-04-27 NOTE — Progress Notes (Signed)
Patient arrived to unit per ED stretcher c Bipap.  Reviewed treatment plan and this RN agrees.  Report received from bedside RN, Junie Panning.  Consent obtained.  Patient A & o X 4. Lung sounds diminished to ausculation in all fields. BLE 2+ edema. Cardiac: NSR, V paced.  Prepped LUAVF with alcohol and cannulated with two 15 gauge needles.  Pulsation of blood noted.  Flushed access well with saline per protocol.  Connected and secured lines and initiated tx at 1902.  UF goal of 3500 mL and net fluid removal of 3000 mL.  Will continue to monitor.

## 2017-04-27 NOTE — Progress Notes (Signed)
Dialysis treatment completed.  3000 mL ultrafiltrated and net fluid removal 2500 mL.    Patient resting comfortably. Lung sounds diminished to ausculation in all fields, pt sating well on 4L [place]. BLE 2+ edema. Cardiac: V paced.  Disconnected lines and removed needles.  Pressure held for 10 minutes and band aid/gauze dressing applied.  Report given to bedside RN, Gerald Stabs.

## 2017-04-27 NOTE — ED Notes (Signed)
Pt. C/o worsening CP. EDP at bedside. Contacted RT about Bipap.

## 2017-04-27 NOTE — ED Notes (Signed)
Pt. Placed back on NRB mask at this time due to low oxygen saturation and worsening CP.

## 2017-04-27 NOTE — ED Notes (Signed)
Pt. Given urinal and encouraged to void. Pt. sts he is unable to at this time.

## 2017-04-27 NOTE — Consult Note (Addendum)
Reason for Consult: Volume overload/respiratory failure in patient with ESRD Referring Physician: Jacquelynn Cree M.D. [facility])  HPI:  76 year old Caucasian man with past medical history significant for end-stage renal disease on dialysis M/W/F who was admitted for management of chest pain and worsening shortness of breath/cough. He has a history of TAVR, complete heart block status post pacemaker, history of CVA, COPD, type 2 diabetes mellitus, grade 1 diastolic heart failure with EF of 45-50% with diffuse hypokinesis.  He reports having completed his entire dialysis treatment yesterday leaving only about 0.5 kg above his dry weight. Presented with increasing substernal chest pain that radiated to the left shoulder and not alleviated by 3 doses of sublingual nitroglycerin. He had been on oxygen since his last hospital stay but supplementation did not help today.  Dialysis prescription: Monday/Wednesday/Friday, [facility] 4 hours, blood flow rate 400/dialysate flow 800, EDW 104 kg, 2K/2.5 calcium, left upper arm aVF, no heparin, Hectorol 2 g, Mircera 75 g to 4 weeks  Past Medical History:  Diagnosis Date  . Anginal pain ([place])   . Aortic stenosis    a. severe by echo 09/2014  . Atrial fibrillation ([place])    a. not well documented, not on anticoagulation  . CHF (congestive heart failure) ([place])   . Claustrophobia   . Complete heart block ([place])   . COPD (chronic obstructive pulmonary disease) ([place])   . Coronary artery disease    a. chronically occluded RCA per cath 09/2014 with collaterals  . CVA (cerebral vascular accident) ([place]) 10/2014   denies residual on 07/11/2015  . ESRD (end stage renal disease) on dialysis [facility])    a. on dialysis; [place] [facility]; MWF, LUE fistula (07/11/2015)  . History of blood transfusion    "related to gallbladder OR"  . History of stomach ulcers   . Hyperlipidemia   . Hypertension   . Iron deficiency anemia   . Myocardial infarction ([place]) 10/2014   . Peripheral vascular disease ([place])   . Pneumonia   . Presence of permanent cardiac pacemaker   . Renal insufficiency   . 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 Type II diabetes mellitus ([place])     Past Surgical History:  Procedure Laterality Date  . AV FISTULA PLACEMENT Left 10/19/2014   Procedure: BRACHIOCEPHALIC ARTERIOVENOUS (AV) FISTULA CREATION ;  Surgeon: Conrad , MD;  Location: [facility];  Service: Vascular;  Laterality: Left;  . CARDIAC CATHETERIZATION    . CARDIAC CATHETERIZATION N/A 07/22/2015   Procedure: Right/Left Heart Cath and Coronary Angiography;  Surgeon: [name], MD;  Location: [facility] CV LAB;  Service: Cardiovascular;  Laterality: N/A;  . CATARACT EXTRACTION W/ INTRAOCULAR LENS  IMPLANT, BILATERAL Bilateral 1990's  . CHOLECYSTECTOMY OPEN  1980's  . COLONOSCOPY W/ BIOPSIES AND POLYPECTOMY    . CORONARY ANGIOPLASTY    . EP IMPLANTABLE DEVICE N/A 07/11/2015   Procedure: Pacemaker Implant;  Surgeon: [name], MD;  Location: [facility] CV LAB;  Service: Cardiovascular;  Laterality: N/A;  . ESOPHAGOGASTRODUODENOSCOPY  08/01/2012   Procedure: ESOPHAGOGASTRODUODENOSCOPY (EGD);  Surgeon: [name], MD;  Location: [facility];  Service: Endoscopy;  Laterality: N/A;  . INSERT / REPLACE / REMOVE PACEMAKER  07/11/2015  . INSERTION OF DIALYSIS CATHETER Right 02/02/2015   Procedure: INSERTION OF DIALYSIS CATHETER  RIGHT INTERNAL JUGULAR;  Surgeon: Mal [name], MD;  Location: [facility];  Service: Vascular;  Laterality: Right;  . LEFT AND RIGHT HEART CATHETERIZATION WITH CORONARY ANGIOGRAM N/A 09/30/2014   Procedure: LEFT AND RIGHT HEART CATHETERIZATION WITH CORONARY ANGIOGRAM;  Surgeon: [name], MD;  Location: [facility] CATH LAB;  Service: Cardiovascular;  Laterality: N/A;  . TEE WITHOUT CARDIOVERSION N/A 08/02/2015   Procedure:  TRANSESOPHAGEAL ECHOCARDIOGRAM (TEE);  Surgeon: [name], MD;  Location: [facility];  Service: Open Heart Surgery;  Laterality: N/A;  . TONSILLECTOMY    . TRANSCATHETER AORTIC VALVE REPLACEMENT, TRANSFEMORAL Left 08/02/2015   Procedure: TRANSCATHETER AORTIC VALVE REPLACEMENT, TRANSFEMORAL;  Surgeon: [name], MD;  Location: [facility];  Service: Open Heart Surgery;  Laterality: Left;    Family History  Problem Relation Age of Onset  . Diabetes Father   . Heart disease Father   . Diabetes Sister   . Alzheimer's disease Mother     Social History:  reports that he quit smoking about 33 years ago. He has a 64.00 pack-year smoking history. He has never used smokeless tobacco. He reports that he drinks alcohol. He reports that he does not use drugs.  Allergies:  Allergies  Allergen Reactions  . Byetta 10 Mcg Pen [Exenatide] Diarrhea and Nausea And Vomiting  . Codeine Itching    Medications: Scheduled:     Dg Chest Portable 1 View  Result Date: 04/27/2017 CLINICAL DATA:  Chest pain EXAM: PORTABLE CHEST 1 VIEW COMPARISON:  04/16/2017 FINDINGS: The cardio pericardial silhouette is enlarged. Asymmetric airspace disease noted, diffusely on the right and involving the left base. Probable small bilateral effusions. The visualized bony structures of the thorax are intact. Telemetry leads overlie the chest. IMPRESSION: Lateral airspace disease compatible with diffuse infection or asymmetric pulmonary edema. Electronically Signed   By: [name] Stanley M.D.   On: 04/27/2017 14:25    Review of Systems  Constitutional: Positive for malaise/fatigue. Negative for chills and fever.  HENT: Negative.   Eyes: Negative.   Respiratory: Positive for cough, sputum production and shortness of breath. Negative for wheezing.   Cardiovascular: Positive for chest pain, orthopnea and leg swelling.  Gastrointestinal: Negative.   Genitourinary: Negative.   Musculoskeletal: Negative.   Skin: Negative.    Neurological: Positive for weakness.  Endo/Heme/Allergies: Negative.    Blood pressure (!) 140/52, pulse 75, temperature 98.1 F (36.7 C), temperature source Rectal, resp. rate 15, height 5\' 11"  (1.803 m), weight 104.3 kg (230 lb), SpO2 96 %. Physical Exam  Nursing note and vitals reviewed. Constitutional: He is oriented to person, place, and time. He appears well-developed and well-nourished.  Appears comfortable at this time on BiPAP  HENT:  Head: Normocephalic and atraumatic.  Right Ear: External ear normal.  Mouth/Throat: Oropharynx is clear and moist.  Eyes: EOM are normal. Pupils are equal, round, and reactive to light. No scleral icterus.  Neck: Normal range of motion. JVD present. No thyromegaly present.  Cardiovascular: Normal rate, regular rhythm and normal heart sounds.   Respiratory: He has wheezes. He has rales.   Coarse rales with expiratory wheeze bilaterally-on BiPAP  GI: Soft. Bowel sounds are normal. He exhibits no distension. There is no tenderness. There is no rebound.  Musculoskeletal: He exhibits edema.  1+ lower extremity edema over legs, left upper arm aVF  Neurological: He is alert and oriented to person, place, and time.  Skin: Skin is warm and dry. No rash noted. No erythema.    Assessment/Plan: 1. Acute on chronic respiratory failure: Suspected to be from a combination of volume overload and possibly  superimposed lower respiratory tract infection given presence of leukocytosis. Started on empiric management with Levaquin and will undertake emergent hemodialysis for ultrafiltration with hopes of reversing respiratory failure. 2. End-stage renal disease: Emergent hemodialysis to be undertaken today for volume management. 3. Chest pain: Known history of coronary artery disease/angina-to be admitted to the stepdown unit for ongoing telemetry with aspirin/metoprolol and IV heparin. 4. Hyponatremia: Mild, appears to be chronic and likely associated with his COPD  rather than entirely from volume overload. 5. Anemia: Associated with end-stage renal disease, reconcile medications for ESA. 6. Metabolic bone disease: Will reconcile the ED are dosing with usual dialysis and restart phosphorus binders when able to satisfactorily take oral intake.  Robynn Marcel K. 04/27/2017, 6:07 PM

## 2017-04-27 NOTE — ED Notes (Signed)
Pt. 82% on 4L via [place] oxygen. Pt. Placed on NRB at this time. Will make EDP aware.

## 2017-04-27 NOTE — H&P (Signed)
History and Physical:    Jeffrey [name]   AJG:[id number] DOB: Mar 03, 1941 DOA: 04/27/2017  Referring MD/provider: Dr. Lita Mains PCP: Thea Silversmith Dianna Rossetti, MD  Cardiologist: Dr. Caryl Comes Nephrologist: Dr. Lorrene Reid  Patient coming from: Home  Chief Complaint: Chest pain  History of Present Illness:   Jeffrey [name] is an 76 y.o. male multiple medical problems including ESRD who receives HD Q M/W/F and was last dialyzed 04/26/17, atrial fibrillation status post pacemaker, CAD with chronic RCA occlusion, severe aortic stenosis status post bioprosthetic TAVR, chronic systolic and diastolic CHF (EF 35-59 percent, diffuse hypokinesis, grade 1 diastolic dysfunction noted on echo 07/19/16), COPD, diabetes who presents with chest pain that started gradually at 8:30 this morning.  Chest pain radiated to left shoulder, took a NTG, which eased pain off for about 1 hour but then the pain came back so he took an additional NTG which eased the pain off for another hour, then when it came back the 3rd time, he took another NTG but the 3rd time it did not ease the pain off, so he came to the ED for evaluation.  Has been on oxygen at home since his previous hospital stay, and symptoms are associated with worsening shortness of breath.    ED Course:  The patient was given a sublingual nitroglycerin and placed on a nitroglycerin drip as well as BiPAP. Labs were significant for a WBC of 20.8. BNP was 1193.6, troponin 0.13, BUN 44, creatinine 3.76. ABG pH 7.407, PCO2 39.5, PO2 53.  ROS:   Review of Systems  Constitutional: Positive for malaise/fatigue. Negative for chills and fever.  HENT: Negative for sore throat.   Respiratory: Positive for cough, sputum production and shortness of breath. Negative for stridor.   Cardiovascular: Positive for chest pain and palpitations.  Gastrointestinal: Positive for heartburn, nausea and vomiting. Negative for abdominal pain, blood in stool, constipation, diarrhea and melena.    Genitourinary: Negative.   Musculoskeletal: Positive for back pain. Negative for falls.  Skin: Negative.   Neurological: Positive for weakness. Negative for dizziness.  Endo/Heme/Allergies: Bruises/bleeds easily.  Psychiatric/Behavioral: Negative for depression.    Past Medical History:   Past Medical History:  Diagnosis Date  . Anginal pain ([place])   . Aortic stenosis    a. severe by echo 09/2014  . Atrial fibrillation ([employer])    a. not well documented, not on anticoagulation  . CHF (congestive heart failure) ([place])   . Claustrophobia   . Complete heart block ([place])   . COPD (chronic obstructive pulmonary disease) ([place])   . Coronary artery disease    a. chronically occluded RCA per cath 09/2014 with collaterals  . CVA (cerebral vascular accident) ([place]) 10/2014   denies residual on 07/11/2015  . ESRD (end stage renal disease) on dialysis [facility])    a. on dialysis; [facility]; MWF, LUE fistula (07/11/2015)  . History of blood transfusion    "related to gallbladder OR"  . History of stomach ulcers   . Hyperlipidemia   . Hypertension   . Iron deficiency anemia   . Myocardial infarction ([place]) 10/2014  . Peripheral vascular disease ([place])   . Pneumonia   . Presence of permanent cardiac pacemaker   . Renal insufficiency   . 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 Type II diabetes mellitus ([place])     Past Surgical History:   Past Surgical History:  Procedure Laterality Date  . AV FISTULA PLACEMENT Left 10/19/2014   Procedure: BRACHIOCEPHALIC ARTERIOVENOUS (AV) FISTULA CREATION ;  Surgeon: Conrad Rice, MD;  Location: [place];  Service: Vascular;  Laterality: Left;  . CARDIAC CATHETERIZATION    . CARDIAC CATHETERIZATION N/A 07/22/2015   Procedure: Right/Left Heart Cath and Coronary Angiography;  Surgeon: [name], MD;  Location: [place] CV LAB;   Service: Cardiovascular;  Laterality: N/A;  . CATARACT EXTRACTION W/ INTRAOCULAR LENS  IMPLANT, BILATERAL Bilateral 1990's  . CHOLECYSTECTOMY OPEN  1980's  . COLONOSCOPY W/ BIOPSIES AND POLYPECTOMY    . CORONARY ANGIOPLASTY    . EP IMPLANTABLE DEVICE N/A 07/11/2015   Procedure: Pacemaker Implant;  Surgeon: [name], MD;  Location: [place] CV LAB;  Service: Cardiovascular;  Laterality: N/A;  . ESOPHAGOGASTRODUODENOSCOPY  08/01/2012   Procedure: ESOPHAGOGASTRODUODENOSCOPY (EGD);  Surgeon: [name], MD;  Location: [facility];  Service: Endoscopy;  Laterality: N/A;  . INSERT / REPLACE / REMOVE PACEMAKER  07/11/2015  . INSERTION OF DIALYSIS CATHETER Right 02/02/2015   Procedure: INSERTION OF DIALYSIS CATHETER  RIGHT INTERNAL JUGULAR;  Surgeon: Mal [name], MD;  Location: [place] [facility];  Service: Vascular;  Laterality: Right;  . LEFT AND RIGHT HEART CATHETERIZATION WITH CORONARY ANGIOGRAM N/A 09/30/2014   Procedure: LEFT AND RIGHT HEART CATHETERIZATION WITH CORONARY ANGIOGRAM;  Surgeon: [name], MD;  Location: [facility] CATH LAB;  Service: Cardiovascular;  Laterality: N/A;  . TEE WITHOUT CARDIOVERSION N/A 08/02/2015   Procedure: TRANSESOPHAGEAL ECHOCARDIOGRAM (TEE);  Surgeon: [name], MD;  Location: [place];  Service: Open Heart Surgery;  Laterality: N/A;  . TONSILLECTOMY    . TRANSCATHETER AORTIC VALVE REPLACEMENT, TRANSFEMORAL Left 08/02/2015   Procedure: TRANSCATHETER AORTIC VALVE REPLACEMENT, TRANSFEMORAL;  Surgeon: [name], MD;  Location: [place];  Service: Open Heart Surgery;  Laterality: Left;    Social History:   Social History   Social History  . Marital status: Married    Spouse name: N/A  . Number of children: N/A  . Years of education: N/A   Occupational History  . Not on file.   Social History Main Topics  . Smoking status: Former Smoker    Packs/day: 2.00    Years: 32.00    Quit date: 11/27/1983  . Smokeless tobacco: Never Used  . Alcohol use Yes      Comment: occ  . Drug use: No  . Sexual activity: Yes    Birth control/ protection: None   Other Topics Concern  . Not on file   Social History Narrative  . No narrative on file    Allergies   Byetta 10 mcg pen [exenatide] and Codeine  Family history:   Family History  Problem Relation Age of Onset  . Diabetes Father   . Heart disease Father   . Diabetes Sister   . Alzheimer's disease Mother     Current Medications:   Prior to Admission medications   Medication Sig Start Date End Date Taking? Authorizing Provider  albuterol (PROVENTIL HFA;VENTOLIN HFA) 108 (90 Base) MCG/ACT inhaler Inhale into the lungs. 03/26/17  Yes [provider]  amLODipine (NORVASC) 10 MG tablet Take 10 mg by mouth daily.    Yes [provider]  aspirin EC 325 MG tablet Take 1 tablet (325 mg total) by mouth daily. 09/05/15  Yes [name], MD  atorvastatin (LIPITOR) 40 MG tablet Take 40 mg by mouth daily.   Yes [provider]  calcium carbonate (TUMS -  DOSED IN MG ELEMENTAL CALCIUM) 500 MG chewable tablet Chew 1 tablet by mouth 4 (four) times daily as needed for indigestion or heartburn.   Yes [provider]  cinacalcet (SENSIPAR) 30 MG tablet Take 30 mg by mouth daily with supper.   Yes [provider]  Insulin Detemir (LEVEMIR FLEXTOUCH) 100 UNIT/ML Pen Inject 36 Units into the skin at bedtime.    Yes [provider]  ipratropium-albuterol (DUONEB) 0.5-2.5 (3) MG/3ML SOLN Take 3 mLs by nebulization every 6 (six) hours. 04/02/17  Yes Mannam, Praveen, MD  lidocaine-prilocaine (EMLA) cream Apply 1 application topically once as needed (prior to accessing port).    Yes [provider]  metoprolol tartrate (LOPRESSOR) 25 MG tablet Take 0.5 tablets (12.5 mg total) by mouth 2 (two) times daily. 08/05/15  Yes Bhagat, Bhavinkumar, PA  nitroGLYCERIN (NITROSTAT) 0.4 MG SL tablet Place 0.4 mg under the tongue every 5 (five) minutes as needed for chest  pain.    Yes [provider]  ranitidine (ZANTAC) 150 MG tablet Take 150 mg by mouth 2 (two) times daily as needed for heartburn.   Yes [provider]  sevelamer carbonate (RENVELA) 800 MG tablet Take 2,400-3,200 mg by mouth See admin instructions. Take 4 capsules with meals and 3 with snacks   Yes [provider]  budesonide-formoterol (SYMBICORT) 160-4.5 MCG/ACT inhaler Inhale 2 puffs into the lungs 2 (two) times daily. 04/16/17 04/17/17  Tanda Rockers, MD    Physical Exam:   Vitals:   04/27/17 1535 04/27/17 1600 04/27/17 1630 04/27/17 1700  BP:  (!) 143/68 (!) 143/72 132/62  Pulse: 79 96 99 74  Resp: (!) 24 (!) 25 (!) 22 (!) 22  Temp:      TempSrc:      SpO2: 91% (!) 89% (!) 89% 96%  Weight:      Height:         Physical Exam: Blood pressure 132/62, pulse 74, temperature 98.1 F (36.7 C), temperature source Rectal, resp. rate (!) 22, height 5\' 11"  (1.803 m), weight 104.3 kg (230 lb), SpO2 96 %. Gen: Elderly chronically ill appearing male in moderate respiratory distress with BiPAP on. Head: Normocephalic, atraumatic. Eyes: Pupils equal, round and reactive to light. Extraocular movements intact.  Sclerae nonicteric. No lid lag. Mouth: Oropharynx reveals moist mucous membranes, unable to examine posterior pharynx due to BiPAP. Edentulous. Neck: Supple, no thyromegaly, no lymphadenopathy, no obvious jugular venous distention. Chest: Lungs are coarse with bilateral rails midway up the lung fields. Increased work of breathing with tachypnea. CV: Heart sounds are regular with an S1, S2. Grade 2/6 systolic murmur. Abdomen: Soft, obese, nontender with normal active bowel sounds. No hepatosplenomegaly or palpable masses. Extremities: Extremities are with 2+ pitting edema. Left upper arm AV fistula with positive thrill and bruit. Skin: Warm and dry. Scattered ecchymosis. Neuro: Alert and oriented times 3; grossly nonfocal.  Psych: Insight is good and judgment  is appropriate. Mood and affect normal.   Data Review:    Labs: Basic Metabolic Panel:  Recent Labs Lab 04/27/17 1352  NA 133*  K 4.7  CL 95*  CO2 26  GLUCOSE 436*  BUN 44*  CREATININE 3.76*  CALCIUM 8.6*   Liver Function Tests:  Recent Labs Lab 04/27/17 1357  AST 20  ALT 12*  ALKPHOS 98  BILITOT 1.2  PROT 7.0  ALBUMIN 3.1*   No results for input(s): LIPASE, AMYLASE in the last 168 hours. No results for input(s): AMMONIA in the last  168 hours. CBC:  Recent Labs Lab 04/27/17 1352  WBC 20.8*  HGB 10.0*  HCT 31.0*  MCV 95.1  PLT 401*   Cardiac Enzymes: No results for input(s): CKTOTAL, CKMB, CKMBINDEX, TROPONINI in the last 168 hours.  BNP (last 3 results) No results for input(s): PROBNP in the last 8760 hours. CBG:  Recent Labs Lab 04/27/17 1634 04/27/17 1703  GLUCAP 468* 402*    Urinalysis    Component Value Date/Time   COLORURINE YELLOW 09/28/2014 [place] 09/28/2014 2257   LABSPEC 1.013 09/28/2014 2257   PHURINE 5.5 09/28/2014 2257   GLUCOSEU 500 (A) 09/28/2014 2257   HGBUR SMALL (A) 09/28/2014 2257   BILIRUBINUR NEGATIVE 09/28/2014 2257   KETONESUR NEGATIVE 09/28/2014 2257   PROTEINUR >300 (A) 09/28/2014 2257   UROBILINOGEN 0.2 09/28/2014 2257   NITRITE NEGATIVE 09/28/2014 2257   LEUKOCYTESUR NEGATIVE 09/28/2014 2257      Radiographic Studies: Dg Chest Portable 1 View  Result Date: 04/27/2017 CLINICAL DATA:  Chest pain EXAM: PORTABLE CHEST 1 VIEW COMPARISON:  04/16/2017 FINDINGS: The cardio pericardial silhouette is enlarged. Asymmetric airspace disease noted, diffusely on the right and involving the left base. Probable small bilateral effusions. The visualized bony structures of the thorax are intact. Telemetry leads overlie the chest. IMPRESSION: Lateral airspace disease compatible with diffuse infection or asymmetric pulmonary edema. Electronically Signed   By: [name] Stanley M.D.   On: 04/27/2017 14:25     EKG: Independently reviewed. Atrial sensed ventricular paced rhythm.   Assessment/Plan:   Principal Problems:   Chest pain in a patient with known CAD/angina/elevated troponin Admit to SDU. Cardiology consulted by EDP. Continue aspirin and metoprolol. Cycle troponins. We'll start on IV heparin given that this is a high risk patient.    Acute on chronic respiratory failure with hypoxia (HCC)/acute on chronic systolic and diastolic CHF/fluid overload/hypervolemia Multifactorial with volume overload, acute on chronic systolic/diastolic CHF, possible superimposed pneumonia, probable obesity hypoventilation syndrome all contributory. Have spoken to Dr. Posey Pronto about assessing him for the need for urgent HD to address volume overload. Currently needing BiPAP. Given leukocytosis and chest x-ray showing possible superimposed pneumonia, will empirically place on Levaquin.  Active Problems:   COPD Recently finished a course of steroids. Continue Symbicort and duo nebs every 6 hours.    Hypertension Continue Norvasc and metoprolol.    Type 2 diabetes mellitus with chronic kidney disease on chronic dialysis, with long-term current use of insulin (HCC) Continue Levemir 36 units daily at bedtime.    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 Continue Sensipar, Renvela and calcium carbonate.    Severe aortic stenosis status post transcatheter aortic valve replacement Status post TAVR.    Hyperlipidemia Continue Lipitor.    Automatic implantable cardioverter-defibrillator in situ    Idiopathic peripheral neuropathy     Hyponatremia Likely reflective of volume overload.    Morbid obesity due to excess calories (HCC) Body mass index is 32.08 kg/m.   Other information:   DVT prophylaxis: Therapeutic dose heparin ordered. Code Status: Full code. Family Communication: Daughter at the  bedside Disposition Plan: SDU. Consults called: Dr. Posey Pronto, nephrology; cardiology consult called awaiting return phone call. Admission status: Inpatient.   The medical decision making on this patient was of high complexity and the patient is at high risk for clinical deterioration, therefore this is a level 3 visit.   Attestation regarding necessity of inpatient status:   The appropriate admission  status for this patient is INPATIENT. Inpatient status is judged to be reasonable and necessary in order to provide the required intensity of service to ensure the patient's safety. The patient's presenting symptoms, physical exam findings, and initial radiographic and laboratory data in the context of their chronic comorbidities is felt to place them at high risk for further clinical deterioration. Furthermore, it is not anticipated that the patient will be medically stable for discharge from the hospital within 2 midnights of admission. The following factors support the admission status of inpatient.    The patient's presenting symptoms include respiratory distress with increased work of breathing and tachypnea as well as chest pain concerning for unstable angina.  The worrisome physical exam findings include volume overload with bilateral crackles in the lung fields, 2+ edema, increased work of breathing.  The initial radiographic and laboratory data are worrisome because of chest x-ray showing pulmonary edema versus pneumonia versus both; leukocytosis; elevated troponin.  The chronic co-morbidities include COPD, chronic systolic/diastolic CHF, CAD, ESRD on HD.   * I certify that at the point of admission it is my clinical judgment that the patient will require inpatient hospital care spanning beyond 2 midnights from the point of admission due to high intensity of service, high risk for further deterioration and high frequency of surveillance required.*    RAMA,CHRISTINA [facility] Pager [phone] Cell: [phone]   If 7PM-7AM, please contact night-coverage [email or web address] Password [email or web address] 04/27/2017, 5:18 PM

## 2017-04-27 NOTE — ED Notes (Signed)
Pt. Does  Not think he will be able to tolerate BiPAP. Pt. Placed on venturi mask at this and titrated to range of 89-91%.

## 2017-04-27 NOTE — ED Notes (Signed)
Nephrology verbal to D/C nitroglycerin.

## 2017-04-27 NOTE — ED Notes (Signed)
Pt. States increasing CP. EDP made aware. RT at bedside with Bipap.

## 2017-04-27 NOTE — Progress Notes (Signed)
Respiratory contacted for trial off bipap during HD tx.  Will come when able.  Will continue to monitor.

## 2017-04-27 NOTE — ED Notes (Signed)
Admitting at bedside 

## 2017-04-27 NOTE — Progress Notes (Signed)
ANTICOAGULATION CONSULT NOTE - Initial Consult  Pharmacy Consult for Heparin Indication: chest pain/ACS  Allergies  Allergen Reactions  . Byetta 10 Mcg Pen [Exenatide] Diarrhea and Nausea And Vomiting  . Codeine Itching    Patient Measurements: Height: 5\' 11"  (180.3 cm) Weight: 229 lb 15 oz (104.3 kg) (ED weight) IBW/kg (Calculated) : 75.3 Heparin Dosing Weight: 102 kg  Vital Signs: Temp: 98 F (36.7 C) (06/02 1857) Temp Source: Rectal (06/02 1447) BP: 124/60 (06/02 1902) Pulse Rate: 84 (06/02 1902)  Labs:  Recent Labs  04/27/17 1352  HGB 10.0*  HCT 31.0*  PLT 401*  CREATININE 3.76*    Estimated Creatinine Clearance: 19.9 mL/min (A) (by C-G formula based on SCr of 3.76 mg/dL (H)).   Medical History: Past Medical History:  Diagnosis Date  . Anginal pain ([place])   . Aortic stenosis    a. severe by echo 09/2014  . Atrial fibrillation ([place])    a. not well documented, not on anticoagulation  . CHF (congestive heart failure) ([place])   . Claustrophobia   . Complete heart block ([place])   . COPD (chronic obstructive pulmonary disease) ([place])   . Coronary artery disease    a. chronically occluded RCA per cath 09/2014 with collaterals  . CVA (cerebral vascular accident) ([place]) 10/2014   denies residual on 07/11/2015  . ESRD (end stage renal disease) on dialysis [facility] ([place]    a. on dialysis; [facility]; MWF, LUE fistula (07/11/2015)  . History of blood transfusion    "related to gallbladder OR"  . History of stomach ulcers   . Hyperlipidemia   . Hypertension   . Iron deficiency anemia   . Myocardial infarction ([place]) 10/2014  . Peripheral vascular disease ([place])   . Pneumonia   . Presence of permanent cardiac pacemaker   . Renal insufficiency   . 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 Type II diabetes mellitus [facility])      Assessment: 76 year old male  with ESRD to begin heparin for chest pain  Goal of Therapy:  Heparin level 0.3-0.7 units/ml Monitor platelets by anticoagulation protocol: Yes   Plan:  Heparin 3000 units iv bolus x 1 Heparin drip at 1400 units / hr Daily heparin level / CBC  Also to begin Levaquin for pneumonia --> 750 mg IV x 1 then 500 mg iv Q 48  Thank you Anette Guarneri, PharmD [phone] 04/27/2017,7:16 PM

## 2017-04-27 NOTE — ED Provider Notes (Signed)
[facility] DEPT Provider Note   CSN: [id number] Arrival date & time: 04/27/17  1317     History   Chief Complaint Chief Complaint  Patient presents with  . Chest Pain    HPI Jeffrey [name] is a 76 y.o. male.  HPI Patient was recently admitted for pneumonia. States he had his normal dialysis yesterday. Develop shortness of breath, , cough and left-sided chest pain this morning which has progressed throughout the day. Denies any fever or chills. Has had some bilateral lower extremity swelling. Took nitroglycerin at home with some relief of his chest pain. States he took a breathing treatment earlier with some improvement of his shortness of breath. Past Medical History:  Diagnosis Date  . Anginal pain ([place])   . Aortic stenosis    a. severe by echo 09/2014  . Atrial fibrillation ([place])    a. not well documented, not on anticoagulation  . CHF (congestive heart failure) ([place])   . Claustrophobia   . Complete heart block ([place])   . COPD (chronic obstructive pulmonary disease) ([place])   . Coronary artery disease    a. chronically occluded RCA per cath 09/2014 with collaterals  . CVA (cerebral vascular accident) ([place]) 10/2014   denies residual on 07/11/2015  . ESRD (end stage renal disease) on dialysis [facility])    a. on dialysis; [place] [facility]; MWF, LUE fistula (07/11/2015)  . History of blood transfusion    "related to gallbladder OR"  . History of stomach ulcers   . Hyperlipidemia   . Hypertension   . Iron deficiency anemia   . Myocardial infarction ([place]) 10/2014  . Peripheral vascular disease ([place])   . Pneumonia   . Presence of permanent cardiac pacemaker   . Renal insufficiency   . 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 Type II diabetes mellitus [facility])     Patient Active Problem List   Diagnosis Date Noted  . Morbid obesity due to excess calories ([place])  04/17/2017  . Hyponatremia 03/10/2017  . COPD with acute exacerbation ([place]) 03/10/2017  . Non-healing open wound of left groin 09/05/2015  . Automatic implantable cardioverter-defibrillator in situ 08/08/2015  . 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 Aortic stenosis   . Hypervolemia   . Severe aortic stenosis   . Chronic combined systolic and diastolic CHF (congestive heart failure) ([place]) 07/19/2015  . Fluid overload 07/18/2015  . Acute on chronic respiratory failure with hypoxia ([place]) 07/18/2015  . Cardiac device in situ, other   . 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/2015  . Chest pain   . 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 Protein-calorie malnutrition, severe ([place]) 01/14/2015  . ESRD (end stage renal disease) on dialysis ([place]) 01/12/2015  . Aortic valve stenosis, moderate 01/11/2015  . Mobitz type 1 second degree AV block   . Elevated troponin 09/30/2014  . History of stroke 09/29/2014  . Type 2 diabetes mellitus with chronic kidney disease on chronic dialysis, with long-term current use of insulin ([place]) 09/29/2014  . CAD (coronary artery disease) 09/29/2014  . Essential hypertension   . History of tobacco use 11/21/2012  . Hyperlipidemia 01/19/2010  . Arthritis, degenerative 01/19/2010  . Allergic rhinitis 12/05/2009  . Idiopathic peripheral neuropathy 01/05/2009  . 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  . Absolute anemia 11/13/2007  Past Surgical History:  Procedure Laterality Date  . AV FISTULA PLACEMENT Left 10/19/2014   Procedure: BRACHIOCEPHALIC ARTERIOVENOUS (AV) FISTULA CREATION ;  Surgeon: Conrad Townsend, MD;  Location: [facility];  Service: Vascular;  Laterality: Left;  . CARDIAC CATHETERIZATION    . CARDIAC CATHETERIZATION N/A 07/22/2015   Procedure: Right/Left Heart Cath and Coronary Angiography;  Surgeon: [name], MD;  Location: [facility] CV LAB;  Service: Cardiovascular;  Laterality: N/A;  . CATARACT EXTRACTION W/ INTRAOCULAR LENS  IMPLANT, BILATERAL Bilateral 1990's  . CHOLECYSTECTOMY OPEN  1980's  . COLONOSCOPY W/ BIOPSIES AND POLYPECTOMY    . CORONARY ANGIOPLASTY    . EP IMPLANTABLE DEVICE N/A 07/11/2015   Procedure: Pacemaker Implant;  Surgeon: [name], MD;  Location: [facility] CV LAB;  Service: Cardiovascular;  Laterality: N/A;  . ESOPHAGOGASTRODUODENOSCOPY  08/01/2012   Procedure: ESOPHAGOGASTRODUODENOSCOPY (EGD);  Surgeon: [name], MD;  Location: [facility];  Service: Endoscopy;  Laterality: N/A;  . INSERT / REPLACE / REMOVE PACEMAKER  07/11/2015  . INSERTION OF DIALYSIS CATHETER Right 02/02/2015   Procedure: INSERTION OF DIALYSIS CATHETER  RIGHT INTERNAL JUGULAR;  Surgeon: Mal [name], MD;  Location: [facility];  Service: Vascular;  Laterality: Right;  . LEFT AND RIGHT HEART CATHETERIZATION WITH CORONARY ANGIOGRAM N/A 09/30/2014   Procedure: LEFT AND RIGHT HEART CATHETERIZATION WITH CORONARY ANGIOGRAM;  Surgeon: [name], MD;  Location: [facility] CATH LAB;  Service: Cardiovascular;  Laterality: N/A;  . TEE WITHOUT CARDIOVERSION N/A 08/02/2015   Procedure: TRANSESOPHAGEAL ECHOCARDIOGRAM (TEE);  Surgeon: [name], MD;  Location: [facility];  Service: Open Heart Surgery;  Laterality: N/A;  . TONSILLECTOMY    . TRANSCATHETER AORTIC VALVE REPLACEMENT, TRANSFEMORAL Left 08/02/2015   Procedure: TRANSCATHETER AORTIC VALVE REPLACEMENT, TRANSFEMORAL;  Surgeon: [name], MD;  Location: [facility];  Service: Open Heart Surgery;  Laterality: Left;       Home Medications    Prior to Admission medications   Medication Sig Start Date End Date Taking? Authorizing Provider  albuterol (PROVENTIL HFA;VENTOLIN HFA) 108 (90 Base) MCG/ACT inhaler Inhale into the lungs. 03/26/17  Yes [provider]  amLODipine (NORVASC) 10 MG tablet Take 10 mg by mouth daily.    Yes [provider]  aspirin EC 325 MG  tablet Take 1 tablet (325 mg total) by mouth daily. 09/05/15  Yes [name], MD  atorvastatin (LIPITOR) 40 MG tablet Take 40 mg by mouth daily.   Yes [provider]  calcium carbonate (TUMS - DOSED IN MG ELEMENTAL CALCIUM) 500 MG chewable tablet Chew 1 tablet by mouth 4 (four) times daily as needed for indigestion or heartburn.   Yes [provider]  cinacalcet (SENSIPAR) 30 MG tablet Take 30 mg by mouth daily with supper.   Yes [provider]  Insulin Detemir (LEVEMIR FLEXTOUCH) 100 UNIT/ML Pen Inject 36 Units into the skin at bedtime.    Yes [provider]  ipratropium-albuterol (DUONEB) 0.5-2.5 (3) MG/3ML SOLN Take 3 mLs by nebulization every 6 (six) hours. 04/02/17  Yes Mannam, Praveen, MD  lidocaine-prilocaine (EMLA) cream Apply 1 application topically once as needed (prior to accessing port).    Yes [provider]  metoprolol tartrate (LOPRESSOR) 25 MG tablet Take 0.5 tablets (12.5 mg total) by mouth 2 (two) times daily. 08/05/15  Yes Bhagat, Bhavinkumar, PA  nitroGLYCERIN (NITROSTAT) 0.4 MG SL tablet Place 0.4 mg under the tongue every 5 (five) minutes as needed for chest pain.    Yes  [provider]  ranitidine (ZANTAC) 150 MG tablet Take 150 mg by mouth 2 (two) times daily as needed for heartburn.   Yes [provider]  sevelamer carbonate (RENVELA) 800 MG tablet Take 2,400-3,200 mg by mouth See admin instructions. Take 4 capsules with meals and 3 with snacks   Yes [provider]  budesonide-formoterol (SYMBICORT) 160-4.5 MCG/ACT inhaler Inhale 2 puffs into the lungs 2 (two) times daily. 04/16/17 04/17/17  Tanda Rockers, MD    Family History Family History  Problem Relation Age of Onset  . Diabetes Father   . Heart disease Father   . Diabetes Sister   . Alzheimer's disease Mother     Social History Social History  Substance Use Topics  . Smoking status: Former Smoker    Packs/day: 2.00    Years: 32.00      Quit date: 11/27/1983  . Smokeless tobacco: Never Used  . Alcohol use Yes     Comment: occ     Allergies   Byetta 10 mcg pen [exenatide] and Codeine   Review of Systems Review of Systems  Constitutional: Positive for fatigue. Negative for chills and fever.  Respiratory: Positive for cough, shortness of breath and wheezing.   Cardiovascular: Positive for chest pain and leg swelling. Negative for palpitations.  Gastrointestinal: Negative for abdominal pain, constipation, diarrhea, nausea and vomiting.  Musculoskeletal: Negative for back pain, myalgias, neck pain and neck stiffness.  Skin: Negative for rash and wound.  Neurological: Negative for dizziness, weakness, light-headedness, numbness and headaches.  All other systems reviewed and are negative.    Physical Exam Updated Vital Signs BP (!) 148/60 (BP Location: Right Arm)   Pulse 86   Temp 98 F (36.7 C) (Oral)   Resp (!) 23   Ht 5\' 11"  (1.803 m)   Wt 102.6 kg (226 lb 3.1 oz)   SpO2 96%   BMI 31.55 kg/m   Physical Exam  Constitutional: He is oriented to person, place, and time. He appears well-developed and well-nourished.  HENT:  Head: Normocephalic and atraumatic.  Mouth/Throat: Oropharynx is clear and moist.  Eyes: EOM are normal. Pupils are equal, round, and reactive to light.  Neck: Normal range of motion. Neck supple.  Cardiovascular: Normal rate and regular rhythm.   Murmur (Holosystolic murmur) heard. Pulmonary/Chest: He has wheezes. He has rales.  Crackles in bilateral bases. Diffuse expiratory wheezing throughout. Increase work of breathing.  Abdominal: Soft. Bowel sounds are normal. There is no tenderness. There is no rebound and no guarding.  Musculoskeletal: Normal range of motion. He exhibits edema. He exhibits no tenderness.  1+ bilateral lower extremity pitting edema. No asymmetry or calf tenderness.  Neurological: He is alert and oriented to person, place, and time.  Moving all extremities  without focal deficit. Sensation intact.  Skin: Skin is warm and dry. Capillary refill takes less than 2 seconds. No rash noted. No erythema.  Psychiatric: He has a normal mood and affect. His behavior is normal.  Nursing note and vitals reviewed.    ED Treatments / Results  Labs (all labs ordered are listed, but only abnormal results are displayed) Labs Reviewed  BASIC METABOLIC PANEL - Abnormal; Notable for the following:       Result Value   Sodium 133 (*)    Chloride 95 (*)    Glucose, Bld 436 (*)    BUN 44 (*)    Creatinine, Ser 3.76 (*)    Calcium 8.6 (*)    GFR calc  non Af Amer 14 (*)    GFR calc Af Amer 16 (*)    All other components within normal limits  CBC - Abnormal; Notable for the following:    WBC 20.8 (*)    RBC 3.26 (*)    Hemoglobin 10.0 (*)    HCT 31.0 (*)    RDW 16.7 (*)    Platelets 401 (*)    All other components within normal limits  HEPATIC FUNCTION PANEL - Abnormal; Notable for the following:    Albumin 3.1 (*)    ALT 12 (*)    Indirect Bilirubin 1.0 (*)    All other components within normal limits  BRAIN NATRIURETIC PEPTIDE - Abnormal; Notable for the following:    B Natriuretic Peptide 1,193.6 (*)    All other components within normal limits  TROPONIN I - Abnormal; Notable for the following:    Troponin I 1.44 (*)    All other components within normal limits  TROPONIN I - Abnormal; Notable for the following:    Troponin I 1.39 (*)    All other components within normal limits  TROPONIN I - Abnormal; Notable for the following:    Troponin I 1.86 (*)    All other components within normal limits  GLUCOSE, CAPILLARY - Abnormal; Notable for the following:    Glucose-Capillary 275 (*)    All other components within normal limits  BASIC METABOLIC PANEL - Abnormal; Notable for the following:    Sodium 132 (*)    Chloride 92 (*)    Glucose, Bld 457 (*)    BUN 31 (*)    Creatinine, Ser 2.80 (*)    Calcium 8.1 (*)    GFR calc non Af Amer 20 (*)     GFR calc Af Amer 23 (*)    All other components within normal limits  CBC - Abnormal; Notable for the following:    WBC 16.1 (*)    RBC 2.91 (*)    Hemoglobin 8.7 (*)    HCT 27.5 (*)    RDW 17.0 (*)    All other components within normal limits  HEPARIN LEVEL (UNFRACTIONATED) - Abnormal; Notable for the following:    Heparin Unfractionated <0.10 (*)    All other components within normal limits  GLUCOSE, CAPILLARY - Abnormal; Notable for the following:    Glucose-Capillary 437 (*)    All other components within normal limits  RENAL FUNCTION PANEL - Abnormal; Notable for the following:    Sodium 132 (*)    Chloride 94 (*)    Glucose, Bld 357 (*)    BUN 36 (*)    Creatinine, Ser 3.08 (*)    Calcium 8.5 (*)    Albumin 2.9 (*)    GFR calc non Af Amer 18 (*)    GFR calc Af Amer 21 (*)    All other components within normal limits  GLUCOSE, CAPILLARY - Abnormal; Notable for the following:    Glucose-Capillary 351 (*)    All other components within normal limits  CBC - Abnormal; Notable for the following:    WBC 14.7 (*)    RBC 3.00 (*)    Hemoglobin 9.0 (*)    HCT 28.2 (*)    RDW 17.3 (*)    All other components within normal limits  BASIC METABOLIC PANEL - Abnormal; Notable for the following:    Sodium 134 (*)    Chloride 92 (*)    Glucose, Bld 267 (*)    BUN  49 (*)    Creatinine, Ser 3.98 (*)    Calcium 8.2 (*)    GFR calc non Af Amer 13 (*)    GFR calc Af Amer 15 (*)    All other components within normal limits  GLUCOSE, CAPILLARY - Abnormal; Notable for the following:    Glucose-Capillary 171 (*)    All other components within normal limits  GLUCOSE, CAPILLARY - Abnormal; Notable for the following:    Glucose-Capillary 176 (*)    All other components within normal limits  GLUCOSE, CAPILLARY - Abnormal; Notable for the following:    Glucose-Capillary 269 (*)    All other components within normal limits  GLUCOSE, CAPILLARY - Abnormal; Notable for the following:     Glucose-Capillary 178 (*)    All other components within normal limits  GLUCOSE, CAPILLARY - Abnormal; Notable for the following:    Glucose-Capillary 201 (*)    All other components within normal limits  I-STAT TROPOININ, ED - Abnormal; Notable for the following:    Troponin i, poc 0.13 (*)    All other components within normal limits  I-STAT ARTERIAL BLOOD GAS, ED - Abnormal; Notable for the following:    pO2, Arterial 53.0 (*)    All other components within normal limits  CBG MONITORING, ED - Abnormal; Notable for the following:    Glucose-Capillary 468 (*)    All other components within normal limits  CBG MONITORING, ED - Abnormal; Notable for the following:    Glucose-Capillary 402 (*)    All other components within normal limits  CBG MONITORING, ED - Abnormal; Notable for the following:    Glucose-Capillary 369 (*)    All other components within normal limits  MRSA PCR SCREENING  RESPIRATORY PANEL BY PCR  CULTURE, EXPECTORATED SPUTUM-ASSESSMENT  CULTURE, EXPECTORATED SPUTUM-ASSESSMENT  HEPARIN LEVEL (UNFRACTIONATED)  HEPARIN LEVEL (UNFRACTIONATED)  HEPARIN LEVEL (UNFRACTIONATED)  MAGNESIUM  HEMOGLOBIN A1C  HEPARIN LEVEL (UNFRACTIONATED)  CBC  BASIC METABOLIC PANEL    EKG  EKG Interpretation  Date/Time:  Saturday April 27 2017 13:20:07 EDT Ventricular Rate:  93 PR Interval:  158 QRS Duration: 206 QT Interval:  452 QTC Calculation: 561 R Axis:   -61 Text Interpretation:  Atrial-sensed ventricular-paced rhythm Abnormal ECG Confirmed by Lita Mains  MD, Osmin Welz ([id number]) on 04/27/2017 4:49:17 PM       Radiology No results found.  Procedures Procedures (including critical care time)  Medications Ordered in ED Medications  atorvastatin (LIPITOR) tablet 40 mg (40 mg Oral Given 04/29/17 1749)  cinacalcet (SENSIPAR) tablet 30 mg (30 mg Oral Given 04/29/17 1749)  calcium carbonate (TUMS - dosed in mg elemental calcium) chewable tablet 200 mg of elemental calcium (200 mg of  elemental calcium Oral Given 04/28/17 1859)  sevelamer carbonate (RENVELA) tablet 3,200 mg (3,200 mg Oral Given 04/29/17 1749)  aspirin EC tablet 325 mg (325 mg Oral Given 04/29/17 1750)  metoprolol tartrate (LOPRESSOR) tablet 12.5 mg (12.5 mg Oral Given 04/29/17 2134)  mometasone-formoterol (DULERA) 200-5 MCG/ACT inhaler 2 puff (2 puffs Inhalation Given 04/29/17 2046)  sodium chloride flush (NS) 0.9 % injection 3 mL (3 mLs Intravenous Not Given 04/29/17 2136)  sodium chloride flush (NS) 0.9 % injection 3 mL (not administered)  0.9 %  sodium chloride infusion (not administered)  acetaminophen (TYLENOL) tablet 650 mg (not administered)    Or  acetaminophen (TYLENOL) suppository 650 mg (not administered)  ondansetron (ZOFRAN) tablet 4 mg (not administered)    Or  ondansetron (ZOFRAN) injection 4 mg (not administered)  morphine 4 MG/ML injection 4 mg (4 mg Intravenous Given 04/28/17 0100)  heparin ADULT infusion 100 units/mL (25000 units/259mL sodium chloride 0.45%) (1,400 Units/hr Intravenous New Bag/Given 04/29/17 0620)  ipratropium-albuterol (DUONEB) 0.5-2.5 (3) MG/3ML nebulizer solution 3 mL (3 mLs Nebulization Given 04/29/17 2046)  insulin aspart (novoLOG) injection 0-20 Units (4 Units Subcutaneous Given 04/29/17 1754)  insulin aspart (novoLOG) injection 0-5 Units (2 Units Subcutaneous Given 04/29/17 2134)  ferric gluconate (NULECIT) 125 mg in sodium chloride 0.9 % 100 mL IVPB (0 mg Intravenous Stopping Infusion hung by another clincian 04/29/17 1630)  feeding supplement (NEPRO CARB STEADY) liquid 237 mL (237 mLs Oral Not Given 04/29/17 1400)  multivitamin (RENA-VIT) tablet 1 tablet (1 tablet Oral Given 04/29/17 2134)  sevelamer carbonate (RENVELA) tablet 1,600 mg (1,600 mg Oral Not Given 04/29/17 1600)  amLODipine (NORVASC) tablet 10 mg (10 mg Oral Given 04/29/17 2134)  famotidine (PEPCID) tablet 20 mg (20 mg Oral Given 04/29/17 1748)  levofloxacin (LEVAQUIN) tablet 500 mg (not administered)  predniSONE (DELTASONE)  tablet 50 mg (50 mg Oral Given 04/29/17 1748)  MEDLINE mouth rinse (15 mLs Mouth Rinse Given 04/29/17 2136)  insulin aspart (novoLOG) injection 8 Units (8 Units Subcutaneous Given 04/29/17 1754)  insulin detemir (LEVEMIR) injection 44 Units (44 Units Subcutaneous Given 04/29/17 2134)  albuterol (PROVENTIL) (2.5 MG/3ML) 0.083% nebulizer solution 2.5 mg (not administered)  methylPREDNISolone sodium succinate (SOLU-MEDROL) 125 mg/2 mL injection 125 mg (125 mg Intravenous Given 04/27/17 1435)  albuterol (PROVENTIL) (2.5 MG/3ML) 0.083% nebulizer solution 5 mg (5 mg Nebulization Given 04/27/17 1436)  morphine 4 MG/ML injection 2 mg (2 mg Intravenous Given 04/27/17 1612)  insulin aspart (novoLOG) injection 10 Units (10 Units Intravenous Given 04/27/17 1636)  heparin bolus via infusion 3,000 Units (3,000 Units Intravenous Bolus from Bag 04/27/17 2005)  levofloxacin (LEVAQUIN) tablet 750 mg (750 mg Oral Given 04/28/17 1606)   CRITICAL CARE Performed by: Lita Mains, Kaiyan Luczak Total critical care time: 25 minutes Critical care time was exclusive of separately billable procedures and treating other patients. Critical care was necessary to treat or prevent imminent or life-threatening deterioration. Critical care was time spent personally by me on the following activities: development of treatment plan with patient and/or surrogate as well as nursing, discussions with consultants, evaluation of patient's response to treatment, examination of patient, obtaining history from patient or surrogate, ordering and performing treatments and interventions, ordering and review of laboratory studies, ordering and review of radiographic studies, pulse oximetry and re-evaluation of patient's condition.  Initial Impression / Assessment and Plan / ED Course  I have reviewed the triage vital signs and the nursing notes.  Pertinent labs & imaging results that were available during my care of the patient were reviewed by me and considered in my  medical decision making (see chart for details).    Patient initially given nitroglycerin with some improvement of his chest pain. Requiring nonrebreather to maintain oxygen saturations. The patient had waxing and waning chest pain while in the emergency department. Started on nitroglycerin drip. Patient initially refused BiPAP. We will treat with Ativan as needed so the patient can tolerate BiPAP. X-ray with bilateral infiltrates. Given nonproductive cough and fact patient is afebrile think that pneumonia is less likely than pulmonary edema. Discuss with hospitalist who will see patient in the emergency department. Have paged cardiology but waiting reply. Patient received 325mg  aspirin prior to arrival in the emergency department.  Final Clinical Impressions(s) / ED Diagnoses   Final diagnoses:  Unstable angina (HCC)  Acute pulmonary edema (  [facility])    New Prescriptions Current Discharge Medication List       Julianne [facility], MD 04/30/17 [phone]

## 2017-04-27 NOTE — ED Notes (Signed)
BiPAP applied at this time

## 2017-04-28 ENCOUNTER — Inpatient Hospital Stay (HOSPITAL_COMMUNITY): Payer: Medicare Other

## 2017-04-28 DIAGNOSIS — J181 Lobar pneumonia, unspecified organism: Secondary | ICD-10-CM

## 2017-04-28 DIAGNOSIS — I5042 Chronic combined systolic (congestive) and diastolic (congestive) heart failure: Secondary | ICD-10-CM

## 2017-04-28 DIAGNOSIS — J9621 Acute and chronic respiratory failure with hypoxia: Secondary | ICD-10-CM

## 2017-04-28 DIAGNOSIS — Z952 Presence of prosthetic heart valve: Secondary | ICD-10-CM

## 2017-04-28 DIAGNOSIS — I350 Nonrheumatic aortic (valve) stenosis: Secondary | ICD-10-CM

## 2017-04-28 DIAGNOSIS — G609 Hereditary and idiopathic neuropathy, unspecified: Secondary | ICD-10-CM

## 2017-04-28 DIAGNOSIS — Z95810 Presence of automatic (implantable) cardiac defibrillator: Secondary | ICD-10-CM

## 2017-04-28 DIAGNOSIS — I214 Non-ST elevation (NSTEMI) myocardial infarction: Principal | ICD-10-CM

## 2017-04-28 DIAGNOSIS — E784 Other hyperlipidemia: Secondary | ICD-10-CM

## 2017-04-28 DIAGNOSIS — I361 Nonrheumatic tricuspid (valve) insufficiency: Secondary | ICD-10-CM

## 2017-04-28 LAB — BASIC METABOLIC PANEL
Anion gap: 13 (ref 5–15)
BUN: 31 mg/dL — AB (ref 6–20)
CHLORIDE: 92 mmol/L — AB (ref 101–111)
CO2: 27 mmol/L (ref 22–32)
CREATININE: 2.8 mg/dL — AB (ref 0.61–1.24)
Calcium: 8.1 mg/dL — ABNORMAL LOW (ref 8.9–10.3)
GFR calc Af Amer: 23 mL/min — ABNORMAL LOW (ref >60)
GFR calc non Af Amer: 20 mL/min — ABNORMAL LOW (ref >60)
GLUCOSE: 457 mg/dL — AB (ref 65–99)
Potassium: 4.1 mmol/L (ref 3.5–5.1)
Sodium: 132 mmol/L — ABNORMAL LOW (ref 135–145)

## 2017-04-28 LAB — RESPIRATORY PANEL BY PCR
ADENOVIRUS-RVPPCR: NOT DETECTED
Bordetella pertussis: NOT DETECTED
CORONAVIRUS 229E-RVPPCR: NOT DETECTED
CORONAVIRUS NL63-RVPPCR: NOT DETECTED
CORONAVIRUS OC43-RVPPCR: NOT DETECTED
Chlamydophila pneumoniae: NOT DETECTED
Coronavirus HKU1: NOT DETECTED
Influenza A: NOT DETECTED
Influenza B: NOT DETECTED
METAPNEUMOVIRUS-RVPPCR: NOT DETECTED
MYCOPLASMA PNEUMONIAE-RVPPCR: NOT DETECTED
PARAINFLUENZA VIRUS 1-RVPPCR: NOT DETECTED
PARAINFLUENZA VIRUS 2-RVPPCR: NOT DETECTED
PARAINFLUENZA VIRUS 4-RVPPCR: NOT DETECTED
Parainfluenza Virus 3: NOT DETECTED
Respiratory Syncytial Virus: NOT DETECTED
Rhinovirus / Enterovirus: NOT DETECTED

## 2017-04-28 LAB — RENAL FUNCTION PANEL
Albumin: 2.9 g/dL — ABNORMAL LOW (ref 3.5–5.0)
Anion gap: 12 (ref 5–15)
BUN: 36 mg/dL — ABNORMAL HIGH (ref 6–20)
CO2: 26 mmol/L (ref 22–32)
Calcium: 8.5 mg/dL — ABNORMAL LOW (ref 8.9–10.3)
Chloride: 94 mmol/L — ABNORMAL LOW (ref 101–111)
Creatinine, Ser: 3.08 mg/dL — ABNORMAL HIGH (ref 0.61–1.24)
GFR calc Af Amer: 21 mL/min — ABNORMAL LOW (ref >60)
GFR calc non Af Amer: 18 mL/min — ABNORMAL LOW (ref >60)
Glucose, Bld: 357 mg/dL — ABNORMAL HIGH (ref 65–99)
Phosphorus: 3.6 mg/dL (ref 2.5–4.6)
Potassium: 4 mmol/L (ref 3.5–5.1)
Sodium: 132 mmol/L — ABNORMAL LOW (ref 135–145)

## 2017-04-28 LAB — CBC
HCT: 27.5 % — ABNORMAL LOW (ref 39.0–52.0)
Hemoglobin: 8.7 g/dL — ABNORMAL LOW (ref 13.0–17.0)
MCH: 29.9 pg (ref 26.0–34.0)
MCHC: 31.6 g/dL (ref 30.0–36.0)
MCV: 94.5 fL (ref 78.0–100.0)
PLATELETS: 313 10*3/uL (ref 150–400)
RBC: 2.91 MIL/uL — ABNORMAL LOW (ref 4.22–5.81)
RDW: 17 % — AB (ref 11.5–15.5)
WBC: 16.1 10*3/uL — ABNORMAL HIGH (ref 4.0–10.5)

## 2017-04-28 LAB — TROPONIN I
TROPONIN I: 1.39 ng/mL — AB (ref <0.03)
TROPONIN I: 1.86 ng/mL — AB (ref <0.03)

## 2017-04-28 LAB — HEPARIN LEVEL (UNFRACTIONATED)
Heparin Unfractionated: 0.31 IU/mL (ref 0.30–0.70)
Heparin Unfractionated: <0.1 IU/mL — ABNORMAL LOW (ref 0.30–0.70)
Heparin Unfractionated: 0.35 IU/mL (ref 0.30–0.70)

## 2017-04-28 LAB — GLUCOSE, CAPILLARY
GLUCOSE-CAPILLARY: 437 mg/dL — AB (ref 65–99)
GLUCOSE-CAPILLARY: 176 mg/dL — AB (ref 65–99)
Glucose-Capillary: 351 mg/dL — ABNORMAL HIGH (ref 65–99)
Glucose-Capillary: 171 mg/dL — ABNORMAL HIGH (ref 65–99)

## 2017-04-28 LAB — MRSA PCR SCREENING: MRSA BY PCR: NEGATIVE

## 2017-04-28 LAB — ECHOCARDIOGRAM COMPLETE
Height: 71 in
WEIGHTICAEL: 3668.45 [oz_av]

## 2017-04-28 MED ORDER — INSULIN DETEMIR 100 UNIT/ML ~~LOC~~ SOLN
42.0000 [IU] | Freq: Every day | SUBCUTANEOUS | Status: DC
  Administered 2017-04-28: 42 [IU] via SUBCUTANEOUS
  Filled 2017-04-28 (×2): qty 0.42

## 2017-04-28 MED ORDER — LEVOFLOXACIN 750 MG PO TABS
750.0000 mg | ORAL_TABLET | Freq: Once | ORAL | Status: AC
  Administered 2017-04-28: 750 mg via ORAL
  Filled 2017-04-28: qty 1

## 2017-04-28 MED ORDER — INSULIN ASPART 100 UNIT/ML ~~LOC~~ SOLN
0.0000 [IU] | Freq: Every day | SUBCUTANEOUS | Status: DC
  Administered 2017-04-29 – 2017-05-01 (×2): 2 [IU] via SUBCUTANEOUS

## 2017-04-28 MED ORDER — NITROGLYCERIN 0.4 MG SL SUBL
SUBLINGUAL_TABLET | SUBLINGUAL | Status: AC
  Administered 2017-04-28: 0.4 mg via SUBLINGUAL
  Filled 2017-04-28: qty 1

## 2017-04-28 MED ORDER — INSULIN ASPART 100 UNIT/ML ~~LOC~~ SOLN
0.0000 [IU] | Freq: Three times a day (TID) | SUBCUTANEOUS | Status: DC
  Administered 2017-04-28: 4 [IU] via SUBCUTANEOUS
  Administered 2017-04-28 (×2): 20 [IU] via SUBCUTANEOUS
  Administered 2017-04-29: 4 [IU] via SUBCUTANEOUS
  Administered 2017-04-29: 11 [IU] via SUBCUTANEOUS
  Administered 2017-04-30: 20 [IU] via SUBCUTANEOUS
  Administered 2017-04-30: 11 [IU] via SUBCUTANEOUS
  Administered 2017-04-30 – 2017-05-01 (×2): 4 [IU] via SUBCUTANEOUS
  Administered 2017-05-02: 3 [IU] via SUBCUTANEOUS
  Administered 2017-05-02: 7 [IU] via SUBCUTANEOUS
  Administered 2017-05-02: 3 [IU] via SUBCUTANEOUS
  Administered 2017-05-03: 4 [IU] via SUBCUTANEOUS

## 2017-04-28 MED ORDER — NEPRO/CARBSTEADY PO LIQD
237.0000 mL | Freq: Two times a day (BID) | ORAL | Status: DC
  Administered 2017-04-30: 237 mL via ORAL

## 2017-04-28 MED ORDER — LEVOFLOXACIN 500 MG PO TABS
500.0000 mg | ORAL_TABLET | ORAL | Status: DC
  Administered 2017-04-30 – 2017-05-02 (×2): 500 mg via ORAL
  Filled 2017-04-28 (×2): qty 1

## 2017-04-28 MED ORDER — RENA-VITE PO TABS
1.0000 | ORAL_TABLET | Freq: Every day | ORAL | Status: DC
  Administered 2017-04-28 – 2017-05-02 (×5): 1 via ORAL
  Filled 2017-04-28 (×5): qty 1

## 2017-04-28 MED ORDER — SODIUM CHLORIDE 0.9 % IV SOLN
125.0000 mg | INTRAVENOUS | Status: AC
  Administered 2017-04-29 – 2017-05-03 (×3): 125 mg via INTRAVENOUS
  Filled 2017-04-28 (×6): qty 10

## 2017-04-28 MED ORDER — PREDNISONE 50 MG PO TABS
50.0000 mg | ORAL_TABLET | Freq: Every day | ORAL | Status: DC
  Administered 2017-04-28 – 2017-05-03 (×6): 50 mg via ORAL
  Filled 2017-04-28 (×6): qty 1

## 2017-04-28 MED ORDER — INSULIN ASPART 100 UNIT/ML ~~LOC~~ SOLN
6.0000 [IU] | Freq: Three times a day (TID) | SUBCUTANEOUS | Status: DC
  Administered 2017-04-29: 6 [IU] via SUBCUTANEOUS

## 2017-04-28 MED ORDER — FAMOTIDINE 20 MG PO TABS
20.0000 mg | ORAL_TABLET | Freq: Every day | ORAL | Status: DC
  Administered 2017-04-29 – 2017-05-03 (×5): 20 mg via ORAL
  Filled 2017-04-28 (×5): qty 1

## 2017-04-28 MED ORDER — AMLODIPINE BESYLATE 10 MG PO TABS
10.0000 mg | ORAL_TABLET | Freq: Every day | ORAL | Status: DC
  Administered 2017-04-29: 10 mg via ORAL
  Filled 2017-04-28: qty 1

## 2017-04-28 MED ORDER — SEVELAMER CARBONATE 800 MG PO TABS
1600.0000 mg | ORAL_TABLET | Freq: Two times a day (BID) | ORAL | Status: DC
  Administered 2017-05-03: 1600 mg via ORAL
  Filled 2017-04-28: qty 2

## 2017-04-28 NOTE — Progress Notes (Signed)
Patient is stable on 4L at this time without acute respiratory distress. RT will continue to monitor as needed.

## 2017-04-28 NOTE — Progress Notes (Signed)
PROGRESS NOTE    Jeffrey [name]  DGU:[id number] DOB: 26-Dec-1940 DOA: 04/27/2017 PCP: Thurman Coyer, MD   Brief Narrative:  76 y.o. WM PMHx  ESRD on HD M/W/F and was last dialyzed 04/26/17, atrial fibrillation S/P  pacemaker, CAD with chronic RCA occlusion, severe aortic stenosis S/P Bioprosthetic TAVR, chronic systolic and diastolic CHF (EF 95-63 percent, diffuse hypokinesis, grade 1 diastolic dysfunction noted on echo 07/19/16), COPD, DM Type 2.   who presents with chest pain that started gradually at 8:30 this morning.  Chest pain radiated to left shoulder, took a NTG, which eased pain off for about 1 hour but then the pain came back so he took an additional NTG which eased the pain off for another hour, then when it came back the 3rd time, he took another NTG but the 3rd time it did not ease the pain off, so he came to the ED for evaluation.  Has been on oxygen at home since his previous hospital stay, and symptoms are associated with worsening shortness of breath.    ED Course:  The patient was given a sublingual nitroglycerin and placed on a nitroglycerin drip as well as BiPAP. Labs were significant for a WBC of 20.8. BNP was 1193.6, troponin 0.13, BUN 44, creatinine 3.76. ABG pH 7.407, PCO2 39.5, PO2 53.   Subjective: 6/3 A/O 4 patient currently pain-free. States last episode of CP 0200 last night. States his overall health has deteriorated in last 5 weeks. Previously able to ambulate with cane now unable to ambulate dependent upon wheelchair.     Assessment & Plan:   Principal Problem:   Acute on chronic respiratory failure with hypoxia (HCC) Active Problems:   Type 2 diabetes mellitus with chronic kidney disease on chronic dialysis, with long-term current use of insulin (HCC)   Essential hypertension   Elevated troponin   ESRD (end stage renal disease) on dialysis [facility])   Atherosclerosis of native coronary artery of native heart with angina pectoris (HCC)   Fluid overload  Chronic combined systolic and diastolic CHF (congestive heart failure) (HCC)   Severe aortic stenosis   Hypervolemia   S/P TAVR (transcatheter aortic valve replacement)   Hyperlipidemia   Automatic implantable cardioverter-defibrillator in situ   Idiopathic peripheral neuropathy   Hyponatremia   Morbid obesity due to excess calories ([place])   Chest pain in a patient with known CAD/angina/elevated troponin/NSTEMI -Patient most likely suffered mild NSTEMI, however given his overall poor health status and currently pain-free will treat medically. Cardiology on board and agree with plan. -Continue to follow troponins to ensure resolution -Continue IV heparin -Strict in and out. -2.3 L -Metoprolol 12.5 mg BID  Severe aortic stenosis status post transcatheter aortic valve replacement -Status post TAVR. -Cardiology aware patient's admission await further recommendations.  Acute on chronic respiratory failure with hypoxia (HCC)/Acute on Chronic systolic and diastolic CHF/fluid overload -Multifactorial with volume overload, acute on chronic systolic/diastolic CHF, possible superimposed pneumonia, probable obesity hypoventilation syndrome all contributory.  -HD per nephrology -6/3 echocardiogram completed awaiting read -6/3 spoke with Cardiology Dr. Candee Furbish, will see patient will await recommendations.   Automatic implantable cardioverter-defibrillator in situ   CAP -All of patient has been afebrile since admission, given patient's leukocytosis and significant core morbidities will treat empirically for CAP. -Complete 7 day course antibiotics -Flutter valve -Sputum and respiratory virus panel pending -Prednisone 50 mg daily -DuoNeb TID -Dulera 200/5 g twice a day  COPD - See CAP    Hypertension -Amlodipine 10 mg  daily -Metoprolol 12.5 mg  BID.    Type 2 diabetes mellitus with chronic kidney disease on chronic dialysis, with long-term current use of insulin  (HCC) -Increase Levemir 42 units daily at bedtime. -NovoLog 6 units QAC -Resistant SSI  Idiopathic peripheral neuropathy  -Most likely secondary to his diabetes, hemoglobin A1c pending    ESRD (end stage renal disease) on dialysis (HCC)/secondary hyperparathyroidism -Treatment per Nephrology consultation requested, may need additional HD. Continue Sensipar, Renvela and calcium carbonate.      Hyperlipidemia Continue Lipitor.  Hyponatremia Likely reflective of volume overload.    Morbid obesity due to excess calories (HCC) Body mass index is 32.08 kg/m.   Other information:   DVT prophylaxis: Therapeutic dose heparin ordered. Code Status: Full code. Family Communication:    Consultants:  Nephrology Dr. Posey Pronto,  Cardiology Dr. Candee Furbish      Procedures/Significant Events:  6/2 PCXR:Lateral airspace disease compatible with diffuse infection or asymmetric pulmonary edema.   VENTILATOR SETTINGS: NA   Cultures 6/3 respiratory virus panel pending 6/3 sputum pending    Antimicrobials: Anti-infectives    Start     Stop   04/30/17 1800  levofloxacin (LEVAQUIN) tablet 500 mg         04/29/17 2000  levofloxacin (LEVAQUIN) IVPB 500 mg  Status:  Discontinued     04/28/17 1421   04/28/17 1500  levofloxacin (LEVAQUIN) tablet 750 mg     04/28/17 1606   04/27/17 1930  levofloxacin (LEVAQUIN) IVPB 750 mg  Status:  Discontinued     04/28/17 1421       Devices    LINES / TUBES:      Continuous Infusions: . sodium chloride    . [START ON 04/29/2017] ferric gluconate (FERRLECIT/NULECIT) IV    . heparin 1,400 Units/hr (04/28/17 1206)  . [START ON 04/29/2017] levofloxacin (LEVAQUIN) IV    . levofloxacin (LEVAQUIN) IV       Objective: Vitals:   04/28/17 0319 04/28/17 0643 04/28/17 0812 04/28/17 0814  BP: 137/62 132/66    Pulse: 91 88    Resp: (!) 24 20    Temp: 98.2 F (36.8 C) 97.7 F (36.5 C)    TempSrc: Oral Oral    SpO2: 91% 97% 100% 92%   Weight: 229 lb 4.5 oz (104 kg)     Height:        Intake/Output Summary (Last 24 hours) at 04/28/17 1255 Last data filed at 04/28/17 0400  Gross per 24 hour  Intake            110.6 ml  Output             2500 ml  Net          -2389.4 ml   Filed Weights   04/27/17 1857 04/27/17 2232 04/28/17 0319  Weight: 229 lb 15 oz (104.3 kg) 229 lb 4.5 oz (104 kg) 229 lb 4.5 oz (104 kg)    Examination:  General: A/O 4, positive acute respiratory distress Eyes: negative scleral hemorrhage, negative anisocoria, negative icterus ENT: Negative Runny nose, negative gingival bleeding, Neck:  Negative scars, masses, torticollis, lymphadenopathy, JVD Lungs: diffuse poor air movement positive mild wheezes, negative crackles Cardiovascular: Regular rate and rhythm without murmur gallop or rub normal S1 and S2 Abdomen: negative abdominal pain, nondistended, positive soft, bowel sounds, no rebound, no ascites, no appreciable mass Extremities: No significant cyanosis, clubbing, +1-2+ edema bilateral lower extremities Skin: Negative rashes, lesions, ulcers Psychiatric:  Negative depression, negative anxiety, negative fatigue, negative  mania  Central nervous system:  Cranial nerves II through XII intact, tongue/uvula midline, all extremities muscle strength 5/5, sensation intact throughout, negative dysarthria, negative expressive aphasia, negative receptive aphasia.  .     Data Reviewed: Care during the described time interval was provided by me .  I have reviewed this patient's available data, including medical history, events of note, physical examination, and all test results as part of my evaluation. I have personally reviewed and interpreted all radiology studies.  CBC:  Recent Labs Lab 04/27/17 1352 04/28/17 0541  WBC 20.8* 16.1*  HGB 10.0* 8.7*  HCT 31.0* 27.5*  MCV 95.1 94.5  PLT 401* 401   Basic Metabolic Panel:  Recent Labs Lab 04/27/17 1352 04/28/17 0541 04/28/17 1053  NA  133* 132* 132*  K 4.7 4.1 4.0  CL 95* 92* 94*  CO2 26 27 26   GLUCOSE 436* 457* 357*  BUN 44* 31* 36*  CREATININE 3.76* 2.80* 3.08*  CALCIUM 8.6* 8.1* 8.5*  PHOS  --   --  3.6   GFR: Estimated Creatinine Clearance: 24.3 mL/min (A) (by C-G formula based on SCr of 3.08 mg/dL (H)). Liver Function Tests:  Recent Labs Lab 04/27/17 1357 04/28/17 1053  AST 20  --   ALT 12*  --   ALKPHOS 98  --   BILITOT 1.2  --   PROT 7.0  --   ALBUMIN 3.1* 2.9*   No results for input(s): LIPASE, AMYLASE in the last 168 hours. No results for input(s): AMMONIA in the last 168 hours. Coagulation Profile: No results for input(s): INR, PROTIME in the last 168 hours. Cardiac Enzymes:  Recent Labs Lab 04/27/17 2230 04/28/17 0541 04/28/17 1053  TROPONINI 1.44* 1.39* 1.86*   BNP (last 3 results) No results for input(s): PROBNP in the last 8760 hours. HbA1C: No results for input(s): HGBA1C in the last 72 hours. CBG:  Recent Labs Lab 04/27/17 1703 04/27/17 1743 04/27/17 2242 04/28/17 0757 04/28/17 1129  GLUCAP 402* 369* 275* 437* 351*   Lipid Profile: No results for input(s): CHOL, HDL, LDLCALC, TRIG, CHOLHDL, LDLDIRECT in the last 72 hours. Thyroid Function Tests: No results for input(s): TSH, T4TOTAL, FREET4, T3FREE, THYROIDAB in the last 72 hours. Anemia Panel: No results for input(s): VITAMINB12, FOLATE, FERRITIN, TIBC, IRON, RETICCTPCT in the last 72 hours. Urine analysis:    Component Value Date/Time   COLORURINE YELLOW 09/28/2014 2257   APPEARANCEUR CLEAR 09/28/2014 2257   LABSPEC 1.013 09/28/2014 2257   PHURINE 5.5 09/28/2014 2257   GLUCOSEU 500 (A) 09/28/2014 2257   HGBUR SMALL (A) 09/28/2014 2257   BILIRUBINUR NEGATIVE 09/28/2014 2257   KETONESUR NEGATIVE 09/28/2014 2257   PROTEINUR >300 (A) 09/28/2014 2257   UROBILINOGEN 0.2 09/28/2014 2257   NITRITE NEGATIVE 09/28/2014 2257   LEUKOCYTESUR NEGATIVE 09/28/2014 2257   Sepsis  Labs: @LABRCNTIP (procalcitonin:4,lacticidven:4)  ) Recent Results (from the past 240 hour(s))  MRSA PCR Screening     Status: None   Collection Time: 04/28/17  9:58 AM  Result Value Ref Range Status   MRSA by PCR NEGATIVE NEGATIVE Final    Comment:        The GeneXpert MRSA Assay (FDA approved for NASAL specimens only), is one component of a comprehensive MRSA colonization surveillance program. It is not intended to diagnose MRSA infection nor to guide or monitor treatment for MRSA infections.          Radiology Studies: Dg Chest Portable 1 View  Result Date: 04/27/2017 CLINICAL DATA:  Chest pain  EXAM: PORTABLE CHEST 1 VIEW COMPARISON:  04/16/2017 FINDINGS: The cardio pericardial silhouette is enlarged. Asymmetric airspace disease noted, diffusely on the right and involving the left base. Probable small bilateral effusions. The visualized bony structures of the thorax are intact. Telemetry leads overlie the chest. IMPRESSION: Lateral airspace disease compatible with diffuse infection or asymmetric pulmonary edema. Electronically Signed   By: [name] Stanley M.D.   On: 04/27/2017 14:25        Scheduled Meds: . [START ON 04/29/2017] amLODipine  10 mg Oral QHS  . aspirin EC  325 mg Oral Daily  . atorvastatin  40 mg Oral Daily  . cinacalcet  30 mg Oral Q supper  . [START ON 04/29/2017] famotidine  20 mg Oral Daily  . feeding supplement (NEPRO CARB STEADY)  237 mL Oral BID BM  . insulin aspart  0-20 Units Subcutaneous TID WC  . insulin aspart  0-5 Units Subcutaneous QHS  . insulin detemir  36 Units Subcutaneous QHS  . ipratropium-albuterol  3 mL Nebulization TID  . metoprolol tartrate  12.5 mg Oral BID  . mometasone-formoterol  2 puff Inhalation BID  . multivitamin  1 tablet Oral QHS  . sevelamer carbonate  1,600 mg Oral BID  . sevelamer carbonate  3,200 mg Oral TID WC  . sodium chloride flush  3 mL Intravenous Q12H  . sodium chloride flush  3 mL Intravenous Q12H    Continuous Infusions: . sodium chloride    . [START ON 04/29/2017] ferric gluconate (FERRLECIT/NULECIT) IV    . heparin 1,400 Units/hr (04/28/17 1206)  . [START ON 04/29/2017] levofloxacin (LEVAQUIN) IV    . levofloxacin (LEVAQUIN) IV       LOS: 1 day    Time spent: 40 minutes    WOODS, Geraldo Docker, MD [facility] Pager [phone]   If 7PM-7AM, please contact night-coverage [email or web address] Password [email or web address] 04/28/2017, 12:55 PM

## 2017-04-28 NOTE — Progress Notes (Signed)
ANTICOAGULATION CONSULT NOTE - Follow-up Consult  Pharmacy Consult for Heparin Indication: chest pain/ACS  Allergies  Allergen Reactions  . Byetta 10 Mcg Pen [Exenatide] Diarrhea and Nausea And Vomiting  . Codeine Itching    Patient Measurements: Height: 5\' 11"  (180.3 cm) Weight: 229 lb 4.5 oz (104 kg) IBW/kg (Calculated) : 75.3 Heparin Dosing Weight: 102 kg  Vital Signs: Temp: 98.2 F (36.8 C) (06/03 0319) Temp Source: Oral (06/03 0319) BP: 137/62 (06/03 0319) Pulse Rate: 91 (06/03 0319)  Labs:  Recent Labs  04/27/17 1352 04/27/17 2230 04/28/17 0535 04/28/17 0541  HGB 10.0*  --   --  8.7*  HCT 31.0*  --   --  27.5*  PLT 401*  --   --  313  HEPARINUNFRC  --   --  0.31  --   CREATININE 3.76*  --   --   --   TROPONINI  --  1.44*  --   --     Estimated Creatinine Clearance: 19.9 mL/min (A) (by C-G formula based on SCr of 3.76 mg/dL (H)).   Assessment: 76 year old male on heparin for r/o ACS. Heparin level therapeutic on 1400 units/hr. Hgb down a bit, no bleeding noted.  Goal of Therapy:  Heparin level 0.3-0.7 units/ml Monitor platelets by anticoagulation protocol: Yes   Plan:  Continue heparin drip at 1400 units / hr Will f/u 6h confirmatory heparin level  Sherlon Handing, PharmD, BCPS Clinical pharmacist, pager [phone] 04/28/2017,6:33 AM

## 2017-04-28 NOTE — Consult Note (Addendum)
Cardiology Consultation:   Patient ID: Jeffrey [name]; [id number]; 11/27/1940   Admit date: 04/27/2017 Date of Consult: 04/28/2017  Primary Care Provider: Thurman Coyer, MD Primary Cardiologist/ Primary Electrophysiologist:  Dr. Caryl Comes   Patient Profile:   Jeffrey [name] is a 76 y.o. male who is being seen today for the evaluation of Elevated troponin with known coronary artery disease at the request of Dr. Dia Crawford.  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  He's had in the past, bradycardia associated chest pain during hemodialysis.  He came in yesterday after having chest discomfort that started in the morning radiating to his left shoulder easing off with nitroglycerin but then returned. After the third nitroglycerin he came to the ER for further evaluation. He is on home oxygen. Had some increased shortness of breath.  Currently chest pain-free. He was placed originally on BiPAP in the ER with a white count of 21, BNP 1193 and original troponin was 0.13 but has subsequently elevated to as high as 1.8. Creatinine currently is 3.7.  He states that over the past 5 weeks he has had more and more trouble walking due to generalized weakness.     Past Medical History:  Diagnosis Date  . Anginal pain ([place])   . Aortic stenosis    a. severe by echo 09/2014  . Atrial fibrillation ([place])    a. not well documented, not on anticoagulation  . CHF (congestive heart failure) ([place])   . Claustrophobia   . Complete heart block ([place])   . COPD (chronic obstructive pulmonary disease) ([place])   . Coronary artery disease    a. chronically occluded RCA per cath 09/2014 with  collaterals  . CVA (cerebral vascular accident) ([place]) 10/2014   denies residual on 07/11/2015  . ESRD (end stage renal disease) on dialysis [facility])    a. on dialysis; [facility]; MWF, LUE fistula (07/11/2015)  . History of blood transfusion    "related to gallbladder OR"  . History of stomach ulcers   . Hyperlipidemia   . Hypertension   . Iron deficiency anemia   . Myocardial infarction ([place]) 10/2014  . Peripheral vascular disease ([place])   . Pneumonia   . Presence of permanent cardiac pacemaker   . Renal insufficiency   . 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 Type II diabetes mellitus ([place])     Past Surgical History:  Procedure Laterality Date  . AV FISTULA PLACEMENT Left 10/19/2014   Procedure: BRACHIOCEPHALIC ARTERIOVENOUS (AV) FISTULA CREATION ;  Surgeon: Conrad Austin, MD;  Location: [place];  Service: Vascular;  Laterality: Left;  . CARDIAC CATHETERIZATION    . CARDIAC CATHETERIZATION N/A 07/22/2015   Procedure: Right/Left Heart Cath and Coronary Angiography;  Surgeon: [name], MD;  Location: [place] CV LAB;  Service: Cardiovascular;  Laterality: N/A;  . CATARACT EXTRACTION W/ INTRAOCULAR LENS  IMPLANT, BILATERAL Bilateral 1990's  . CHOLECYSTECTOMY OPEN  1980's  . COLONOSCOPY W/ BIOPSIES AND POLYPECTOMY    . CORONARY ANGIOPLASTY    . EP IMPLANTABLE DEVICE N/A 07/11/2015   Procedure: Pacemaker Implant;  Surgeon: [name], MD;  Location: [place] CV LAB;  Service: Cardiovascular;  Laterality: N/A;  . ESOPHAGOGASTRODUODENOSCOPY  08/01/2012   Procedure: ESOPHAGOGASTRODUODENOSCOPY (EGD);  Surgeon: [name], MD;  Location: [facility];  Service: Endoscopy;  Laterality: N/A;  . INSERT / REPLACE / REMOVE PACEMAKER  07/11/2015  . INSERTION OF DIALYSIS CATHETER Right 02/02/2015   Procedure: INSERTION OF DIALYSIS CATHETER  RIGHT INTERNAL  JUGULAR;  Surgeon: Mal [name], MD;  Location: [place];  Service: Vascular;  Laterality: Right;  . LEFT AND RIGHT HEART CATHETERIZATION WITH CORONARY ANGIOGRAM N/A 09/30/2014   Procedure: LEFT AND RIGHT HEART CATHETERIZATION WITH CORONARY ANGIOGRAM;  Surgeon: [name], MD;  Location: [facility] CATH LAB;  Service: Cardiovascular;  Laterality: N/A;  . TEE WITHOUT CARDIOVERSION N/A 08/02/2015   Procedure: TRANSESOPHAGEAL ECHOCARDIOGRAM (TEE);  Surgeon: [name], MD;  Location: [place];  Service: Open Heart Surgery;  Laterality: N/A;  . TONSILLECTOMY    . TRANSCATHETER AORTIC VALVE REPLACEMENT, TRANSFEMORAL Left 08/02/2015   Procedure: TRANSCATHETER AORTIC VALVE REPLACEMENT, TRANSFEMORAL;  Surgeon: [name], MD;  Location: [place];  Service: Open Heart Surgery;  Laterality: Left;     Inpatient Medications: Scheduled Meds: . [START ON 04/29/2017] amLODipine  10 mg Oral QHS  . aspirin EC  325 mg Oral Daily  . atorvastatin  40 mg Oral Daily  . cinacalcet  30 mg Oral Q supper  . [START ON 04/29/2017] famotidine  20 mg Oral Daily  . feeding supplement (NEPRO CARB STEADY)  237 mL Oral BID BM  . insulin aspart  0-20 Units Subcutaneous TID WC  . insulin aspart  0-5 Units Subcutaneous QHS  . insulin detemir  36 Units Subcutaneous QHS  . ipratropium-albuterol  3 mL Nebulization TID  . metoprolol tartrate  12.5 mg Oral BID  . mometasone-formoterol  2 puff Inhalation BID  . multivitamin  1 tablet Oral QHS  . sevelamer carbonate  1,600 mg Oral BID  . sevelamer carbonate  3,200 mg Oral TID WC  . sodium chloride flush  3 mL Intravenous Q12H  . sodium chloride flush  3 mL Intravenous Q12H   Continuous Infusions: . sodium chloride    . [START ON 04/29/2017] ferric gluconate (FERRLECIT/NULECIT) IV    . heparin 1,400 Units/hr (04/28/17 1206)  . [START ON 04/29/2017] levofloxacin (LEVAQUIN) IV    . levofloxacin (LEVAQUIN) IV     PRN Meds: sodium chloride, acetaminophen **OR** acetaminophen, albuterol,  calcium carbonate, morphine injection, ondansetron **OR** ondansetron (ZOFRAN) IV, sodium chloride flush  Allergies:    Allergies  Allergen Reactions  . Byetta 10 Mcg Pen [Exenatide] Diarrhea and Nausea And Vomiting  . Codeine Itching    Social History:   Social History   Social History  . Marital status: Married    Spouse name: N/A  . Number of children: N/A  . Years of education: N/A   Occupational History  . Not on file.   Social History Main Topics  . Smoking status: Former Smoker    Packs/day: 2.00    Years: 32.00    Quit date: 11/27/1983  . Smokeless tobacco: Never Used  . Alcohol use Yes     Comment: occ  . Drug use: No  . Sexual activity: Yes    Birth control/ protection: None   Other Topics Concern  . Not on file   Social History Narrative  . No narrative on file    Family History:   The patient's family history includes Alzheimer's disease in his mother; Diabetes in his  father and sister; Heart disease in his father.  ROS:  Please see the history of present illness.  ROS no fevers, no chills, no orthopnea, no PND. All other ROS reviewed and negative.     Physical Exam/Data:   Vitals:   04/28/17 0319 04/28/17 0643 04/28/17 0812 04/28/17 0814  BP: 137/62 132/66    Pulse: 91 88    Resp: (!) 24 20    Temp: 98.2 F (36.8 C) 97.7 F (36.5 C)    TempSrc: Oral Oral    SpO2: 91% 97% 100% 92%  Weight: 229 lb 4.5 oz (104 kg)     Height:        Intake/Output Summary (Last 24 hours) at 04/28/17 1318 Last data filed at 04/28/17 0400  Gross per 24 hour  Intake            110.6 ml  Output             2500 ml  Net          -2389.4 ml   Filed Weights   04/27/17 1857 04/27/17 2232 04/28/17 0319  Weight: 229 lb 15 oz (104.3 kg) 229 lb 4.5 oz (104 kg) 229 lb 4.5 oz (104 kg)   Body mass index is 31.98 kg/m.  General:  Well nourished, well developed, in no acute distress HEENT: normal Lymph: no adenopathy Neck: no JVD Endocrine:  No  thryomegaly Vascular: No carotid bruits; Left arm AV fistula.  Cardiac:  normal S1, S2; RRR; 2/6 systolic murmur  Lungs:  clear to auscultation bilaterally, no wheezing, rhonchi or rales  Abd: soft, nontender, no hepatomegaly Ext: Bilateral 1-2+ lower extremity edema Musculoskeletal:  No deformities, BUE and BLE strength normal and equal Skin: warm and dry  Neuro:  CNs 2-12 intact, no focal abnormalities noted Psych:  Normal affect    EKG:  The EKG was personally reviewed and demonstrates ventricular pacing  Relevant CV Studies:    Laboratory Data:  Chemistry Recent Labs Lab 04/27/17 1352 04/28/17 0541 04/28/17 1053  NA 133* 132* 132*  K 4.7 4.1 4.0  CL 95* 92* 94*  CO2 26 27 26   GLUCOSE 436* 457* 357*  BUN 44* 31* 36*  CREATININE 3.76* 2.80* 3.08*  CALCIUM 8.6* 8.1* 8.5*  GFRNONAA 14* 20* 18*  GFRAA 16* 23* 21*  ANIONGAP 12 13 12      Recent Labs Lab 04/27/17 1357 04/28/17 1053  PROT 7.0  --   ALBUMIN 3.1* 2.9*  AST 20  --   ALT 12*  --   ALKPHOS 98  --   BILITOT 1.2  --    Hematology Recent Labs Lab 04/27/17 1352 04/28/17 0541  WBC 20.8* 16.1*  RBC 3.26* 2.91*  HGB 10.0* 8.7*  HCT 31.0* 27.5*  MCV 95.1 94.5  MCH 30.7 29.9  MCHC 32.3 31.6  RDW 16.7* 17.0*  PLT 401* 313   Cardiac Enzymes Recent Labs Lab 04/27/17 2230 04/28/17 0541 04/28/17 1053  TROPONINI 1.44* 1.39* 1.86*    Recent Labs Lab 04/27/17 1411  TROPIPOC 0.13*    BNP Recent Labs Lab 04/27/17 1352  BNP 1,193.6*    DDimer No results for input(s): DDIMER in the last 168 hours.  Radiology/Studies:  Dg Chest Portable 1 View  Result Date: 04/27/2017 CLINICAL DATA:  Chest pain EXAM: PORTABLE CHEST 1 VIEW COMPARISON:  04/16/2017 FINDINGS: The cardio pericardial silhouette is enlarged. Asymmetric airspace disease noted, diffusely on the right and involving the left base. Probable small bilateral effusions. The visualized  bony structures of the thorax are intact. Telemetry leads  overlie the chest. IMPRESSION: Lateral airspace disease compatible with diffuse infection or asymmetric pulmonary edema. Electronically Signed   By: [name] Stanley M.D.   On: 04/27/2017 14:25    Assessment and Plan:   76 year old male with known coronary artery disease, end-stage renal disease on hemodialysis aortic valve replacement via TAVR, with ischemic cardiomyopathy, slightly worsened with elevated troponin of 1.8 and recent chest pain concerning for non-ST elevation myocardial infarction.  Non-ST elevation myocardial infarction  - Troponin mildly elevated however recent concomitant anginal chest discomfort with radiation to his lower gums noted. He is currently chest pain-free. We discussed the pros and cons of cardiac catheterization and at this point, he would like to continue with medical therapy. I would continue with IV heparin overnight. Unfortunately, about once every 3 weeks his blood pressures will drop to the 60s and 70s in dialysis and sometimes he needs to be placed in the Trendelenburg position. I'm worried about [place] adding additional anti-anginal medications. I will give him low-dose isosorbide 15 mg once a day to see if this helps. Continue with low-dose metoprolol. In addition, he has become more debilitated over the last several weeks and notes that it is been quite challenging to walk because of weakness.  - Of course, if anginal symptoms worsen or become more worrisome, we can always pursue diagnostic heart catheterization. His right coronary artery is known to be occluded. A nuclear stress test would most certainly be abnormal with ischemia noted in that territory.  End-stage renal disease  - Nephrology is going to try to take off more fluid during hemodialysis sessions.  Coronary artery disease  - As above  Status post aortic valve replacement  - TAVR  Pacemaker  - Functioning well.  - This followed complete heart block after TAVR  Ischemic cardiomyopathy/acute on  chronic systolic heart failure  - He has felt better once increased fluid has been taken off during hemodialysis.  - EF is moderately reduced at approximate 40%, inferolateral apical akinesis noted.  - Unable to utilize ACE inhibitor. Low-dose beta blocker on board.  We will follow along.   Signed, Candee Furbish, MD  04/28/2017 1:18 PM

## 2017-04-28 NOTE — Progress Notes (Signed)
Dr. Sherral Hammers made aware of pt's troponin 1.86. Pt is asymptomatic with no c/o chest pain at this time. MD to consult with cardiology. No new orders at this time.

## 2017-04-28 NOTE — Progress Notes (Signed)
Dr. Sherral Hammers made aware of patient's troponin 1.39 trending down from 1.44. Pt has no c/o chest pain at this time. Echocardiogram ordered.

## 2017-04-28 NOTE — Progress Notes (Signed)
Patient c/o of chest pain . 12 lead ekg, 1 SL nitro and 4mg  morphine given. Patients chest pain has subsided. On call MD made aware. No new orders at these time

## 2017-04-28 NOTE — Progress Notes (Signed)
ANTICOAGULATION CONSULT NOTE - Follow-up Consult  Pharmacy Consult for Heparin Indication: chest pain/ACS  Allergies  Allergen Reactions  . Byetta 10 Mcg Pen [Exenatide] Diarrhea and Nausea And Vomiting  . Codeine Itching    Patient Measurements: Height: 5\' 11"  (180.3 cm) Weight: 229 lb 4.5 oz (104 kg) IBW/kg (Calculated) : 75.3 Heparin Dosing Weight: 102 kg  Vital Signs: Temp: 97.7 F (36.5 C) (06/03 0643) Temp Source: Oral (06/03 0643) BP: 132/66 (06/03 0643) Pulse Rate: 88 (06/03 0643)  Labs:  Recent Labs  04/27/17 1352 04/27/17 2230 04/28/17 0535 04/28/17 0541 04/28/17 1053  HGB 10.0*  --   --  8.7*  --   HCT 31.0*  --   --  27.5*  --   PLT 401*  --   --  313  --   HEPARINUNFRC  --   --  0.31  --  <0.10*  CREATININE 3.76*  --   --  2.80* 3.08*  TROPONINI  --  1.44*  --  1.39* 1.86*    Estimated Creatinine Clearance: 24.3 mL/min (A) (by C-G formula based on SCr of 3.08 mg/dL (H)).   Assessment: 76 year old male on heparin for r/o ACS. No anticoagulation prior to admission.   Confirmatory heparin level was undetectable after being therapeutic on 1400 units/hr. Per conversation with RN, patient pulled out IV and IV team had to come put in another line, so heparin was off shortly prior to level being drawn. Heparin is running now.  Will not increase dose and re-check level in 6 hours.   Goal of Therapy:  Heparin level 0.3-0.7 units/ml Monitor platelets by anticoagulation protocol: Yes   Plan:  Continue heparin drip at 1400 units / hr Repeat cHL at 2000 Daily heparin level and CBC  F/U cardiology plans  Demetrius Charity, PharmD Acute Care Pharmacy Resident  Pager: [phone] 04/28/2017

## 2017-04-28 NOTE — Progress Notes (Signed)
[facility] Progress Note   Subjective: "I had some chest pain this morning but they gave me an aspirin and a shot of morphine and I feel OK now".  Sitting up at bedside. Denies SOB at present.   Objective Vitals:   04/28/17 0319 04/28/17 0643 04/28/17 0812 04/28/17 0814  BP: 137/62 132/66    Pulse: 91 88    Resp: (!) 24 20    Temp: 98.2 F (36.8 C) 97.7 F (36.5 C)    TempSrc: Oral Oral    SpO2: 91% 97% 100% 92%  Weight: 104 kg (229 lb 4.5 oz)     Height:       Physical Exam General: pleasant chronically ill appearing male in NAD Heart: S1,S2, ? S4 RRR. V paced on monitor. Prominent neck veins.  Lungs: BBS with audible expiratory wheezes RU lung fields, few scattered bibasilar crackles. No WOB  Abdomen: obese, soft, non-distended Extremities: Bilateral trace LE edema-pre tib/ankles.  Dialysis Access: LUA AVF + bruit  Additional Objective Labs: Basic Metabolic Panel:  Recent Labs Lab 04/27/17 1352 04/28/17 0541  NA 133* 132*  K 4.7 4.1  CL 95* 92*  CO2 26 27  GLUCOSE 436* 457*  BUN 44* 31*  CREATININE 3.76* 2.80*  CALCIUM 8.6* 8.1*   Liver Function Tests:  Recent Labs Lab 04/27/17 1357  AST 20  ALT 12*  ALKPHOS 98  BILITOT 1.2  PROT 7.0  ALBUMIN 3.1*  CBC:  Recent Labs Lab 04/27/17 1352 04/28/17 0541  WBC 20.8* 16.1*  HGB 10.0* 8.7*  HCT 31.0* 27.5*  MCV 95.1 94.5  PLT 401* 313   Blood Culture    Component Value Date/Time   SDES BLOOD 07/27/2015 0845   SPECREQUEST  07/27/2015 0845    BOTTLES DRAWN AEROBIC AND ANAEROBIC L AVF [place] 10 ML   CULT NO GROWTH 5 DAYS 07/27/2015 0845   REPTSTATUS 08/01/2015 FINAL 07/27/2015 0845    Cardiac Enzymes:  Recent Labs Lab 04/27/17 2230 04/28/17 0541  TROPONINI 1.44* 1.39*   CBG:  Recent Labs Lab 04/27/17 1634 04/27/17 1703 04/27/17 1743 04/27/17 2242 04/28/17 0757  GLUCAP 468* 402* 369* 275* 437*   Studies/Results: Dg Chest Portable 1 View  Result Date:  04/27/2017 CLINICAL DATA:  Chest pain EXAM: PORTABLE CHEST 1 VIEW COMPARISON:  04/16/2017 FINDINGS: The cardio pericardial silhouette is enlarged. Asymmetric airspace disease noted, diffusely on the right and involving the left base. Probable small bilateral effusions. The visualized bony structures of the thorax are intact. Telemetry leads overlie the chest. IMPRESSION: Lateral airspace disease compatible with diffuse infection or asymmetric pulmonary edema. Electronically Signed   By: [name] Stanley M.D.   On: 04/27/2017 14:25   Medications: . sodium chloride    . heparin 1,400 Units/hr (04/27/17 2006)  . [START ON 04/29/2017] levofloxacin (LEVAQUIN) IV    . levofloxacin (LEVAQUIN) IV     . amLODipine  10 mg Oral Daily  . aspirin EC  325 mg Oral Daily  . atorvastatin  40 mg Oral Daily  . cinacalcet  30 mg Oral Q supper  . famotidine  20 mg Oral BID  . insulin aspart  0-20 Units Subcutaneous TID WC  . insulin aspart  0-5 Units Subcutaneous QHS  . insulin detemir  36 Units Subcutaneous QHS  . ipratropium-albuterol  3 mL Nebulization TID  . metoprolol tartrate  12.5 mg Oral BID  . mometasone-formoterol  2 puff Inhalation BID  . sevelamer carbonate  3,200 mg Oral TID WC  .  sodium chloride flush  3 mL Intravenous Q12H  . sodium chloride flush  3 mL Intravenous Q12H   HD orders: MWF [facility] 4 hrs 400/800 160 NRe  2.0 K/2.5 Ca 103 kg -No heparin -Mircera 100 mcg IV Q 2 weeks (last dose 04/24/17 HGB 9.0 04/24/17) -Venofer 100 mg IV X 5 (2/5 doses given Fe 45 Tsat 27% 0523/18) -Hectoral 2 mcg IV TIW (Last PTH 194 04/17/17)  BMD meds: -Sensipar 30 mg PO daily -Renvela 800 mg 4 tabs PO TID AC 2 tabs PO BID w/snack (last Phos 4.2 Ca  8.1 C Ca 8.8 04/17/17)  Assessment/Plan: 1. Acute on Chronic Respiratory Failure: Required BIPAP on adm-now nasal cannula-O2 sats 90% on RA. CXR shows lateral airspace disease suggesting infection vs pulmonary edema. Had urgent HD last PM, still wheezing today with  ankle edema today. Cont O2, Nebs, will lower EDW with HD tomorrow. On levaquin per primary. Afebrile, WBC 16.1.  2. Chest pain: Known CAD.Troponin peak 1.44. On heparin drip-cont ASA/metoprolol. Per primary.  3. ESRD - MWF urgent HD for volume removal last night. Will serially dialyze pt to lower volume, next tx. 04/29/17. K+4.1. Change to 180 NRe on DC (done).  4. Anemia - HGB 8.7 Last ESA Mircera 100 mcg IV 04/24/17. Continue OP Fe load.  5. Secondary hyperparathyroidism - Add renal function panel to today's labs. Cont binders, sensipar, VDRA 6. HTN/volume - HD last PM Pre wt 104.3 kg Net UF 2.5 liters post wt 104 kg. Still with wheezing, bibasilar crackle, ankle edema. HD tomorrow attempt 3-3.5 liters. Continue metoprolol and amlodipine (change to q hs). BP looks stable at present.  7. Nutrition - Albumin 3.1 reports poor PO intake. Renal/Carb mod diet, renal vit, nepro. 8. DM: per primary 9. H/O Aortic stenosis S/P TAVR 08/02/15 10. H/O Afib. Not on anticoagulation as OP. Now on Heparin drip.  11. H/O CHB: perm pacer: Appropriate capture-variable rate 12. H/O CVA 35. H/O CAD.     Marica Trentham H. Kaymarie Wynn NP-C 04/28/2017, 9:54 AM  [employer] [phone]

## 2017-04-28 NOTE — Progress Notes (Signed)
  Echocardiogram 2D Echocardiogram has been performed.  Jeffrey [name] 04/28/2017, 9:35 AM

## 2017-04-28 NOTE — Progress Notes (Signed)
ANTICOAGULATION CONSULT NOTE - Follow-up Consult  Pharmacy Consult for Heparin Indication: chest pain/ACS  Allergies  Allergen Reactions  . Byetta 10 Mcg Pen [Exenatide] Diarrhea and Nausea And Vomiting  . Codeine Itching    Patient Measurements: Height: 5\' 11"  (180.3 cm) Weight: 229 lb 4.5 oz (104 kg) IBW/kg (Calculated) : 75.3 Heparin Dosing Weight: 102 kg  Vital Signs: Temp: 97.6 F (36.4 C) (06/03 2010) Temp Source: Oral (06/03 2010) BP: 138/71 (06/03 2010) Pulse Rate: 76 (06/03 2010)  Labs:  Recent Labs  04/27/17 1352 04/27/17 2230 04/28/17 0535 04/28/17 0541 04/28/17 1053 04/28/17 1945  HGB 10.0*  --   --  8.7*  --   --   HCT 31.0*  --   --  27.5*  --   --   PLT 401*  --   --  313  --   --   HEPARINUNFRC  --   --  0.31  --  <0.10* 0.35  CREATININE 3.76*  --   --  2.80* 3.08*  --   TROPONINI  --  1.44*  --  1.39* 1.86*  --     Estimated Creatinine Clearance: 24.3 mL/min (A) (by C-G formula based on SCr of 3.08 mg/dL (H)).   Assessment: 76 year old male on heparin for r/o ACS. No anticoagulation prior to admission.   PM heparin level therapeutic  Goal of Therapy:  Heparin level 0.3-0.7 units/ml Monitor platelets by anticoagulation protocol: Yes   Plan:  Continue heparin drip at 1400 units / hr Daily heparin level and CBC  F/U cardiology plans  Thank you Anette Guarneri, PharmD [phone] 04/28/2017

## 2017-04-28 NOTE — Progress Notes (Signed)
Dr. Sherral Hammers made aware of pt's blood sugar 437. Blood draw already done this am. Resistant insulin sliding scale ordered.

## 2017-04-29 DIAGNOSIS — R748 Abnormal levels of other serum enzymes: Secondary | ICD-10-CM

## 2017-04-29 LAB — CBC
HEMATOCRIT: 28.2 % — AB (ref 39.0–52.0)
HEMOGLOBIN: 9 g/dL — AB (ref 13.0–17.0)
MCH: 30 pg (ref 26.0–34.0)
MCHC: 31.9 g/dL (ref 30.0–36.0)
MCV: 94 fL (ref 78.0–100.0)
Platelets: 281 10*3/uL (ref 150–400)
RBC: 3 MIL/uL — ABNORMAL LOW (ref 4.22–5.81)
RDW: 17.3 % — AB (ref 11.5–15.5)
WBC: 14.7 10*3/uL — ABNORMAL HIGH (ref 4.0–10.5)

## 2017-04-29 LAB — BASIC METABOLIC PANEL
Anion gap: 13 (ref 5–15)
BUN: 49 mg/dL — AB (ref 6–20)
CHLORIDE: 92 mmol/L — AB (ref 101–111)
CO2: 29 mmol/L (ref 22–32)
CREATININE: 3.98 mg/dL — AB (ref 0.61–1.24)
Calcium: 8.2 mg/dL — ABNORMAL LOW (ref 8.9–10.3)
GFR calc Af Amer: 15 mL/min — ABNORMAL LOW (ref >60)
GFR calc non Af Amer: 13 mL/min — ABNORMAL LOW (ref >60)
Glucose, Bld: 267 mg/dL — ABNORMAL HIGH (ref 65–99)
Potassium: 4.9 mmol/L (ref 3.5–5.1)
Sodium: 134 mmol/L — ABNORMAL LOW (ref 135–145)

## 2017-04-29 LAB — GLUCOSE, CAPILLARY
Glucose-Capillary: 269 mg/dL — ABNORMAL HIGH (ref 65–99)
Glucose-Capillary: 178 mg/dL — ABNORMAL HIGH (ref 65–99)
Glucose-Capillary: 201 mg/dL — ABNORMAL HIGH (ref 65–99)

## 2017-04-29 LAB — HEPARIN LEVEL (UNFRACTIONATED): Heparin Unfractionated: 0.37 IU/mL (ref 0.30–0.70)

## 2017-04-29 LAB — MAGNESIUM: Magnesium: 2 mg/dL (ref 1.7–2.4)

## 2017-04-29 MED ORDER — ALTEPLASE 2 MG IJ SOLR: 2.0000 mg | Freq: Once | INTRAMUSCULAR | Status: DC | PRN

## 2017-04-29 MED ORDER — SODIUM CHLORIDE 0.9 % IV SOLN: 100.0000 mL | INTRAVENOUS | Status: DC | PRN

## 2017-04-29 MED ORDER — ORAL CARE MOUTH RINSE
15.0000 mL | Freq: Two times a day (BID) | OROMUCOSAL | Status: DC
  Administered 2017-04-29 – 2017-05-02 (×3): 15 mL via OROMUCOSAL

## 2017-04-29 MED ORDER — ALBUTEROL SULFATE (2.5 MG/3ML) 0.083% IN NEBU: 2.5000 mg | INHALATION_SOLUTION | RESPIRATORY_TRACT | Status: DC | PRN

## 2017-04-29 MED ORDER — INSULIN ASPART 100 UNIT/ML ~~LOC~~ SOLN
8.0000 [IU] | Freq: Three times a day (TID) | SUBCUTANEOUS | Status: DC
  Administered 2017-04-29: 8 [IU] via SUBCUTANEOUS

## 2017-04-29 MED ORDER — HEPARIN SODIUM (PORCINE) 1000 UNIT/ML DIALYSIS: 1000.0000 [IU] | INTRAMUSCULAR | Status: DC | PRN

## 2017-04-29 MED ORDER — LIDOCAINE HCL (PF) 1 % IJ SOLN: 5.0000 mL | INTRAMUSCULAR | Status: DC | PRN

## 2017-04-29 MED ORDER — LIDOCAINE-PRILOCAINE 2.5-2.5 % EX CREA: 1.0000 "application " | TOPICAL_CREAM | CUTANEOUS | Status: DC | PRN

## 2017-04-29 MED ORDER — PENTAFLUOROPROP-TETRAFLUOROETH EX AERO: 1.0000 "application " | INHALATION_SPRAY | CUTANEOUS | Status: DC | PRN

## 2017-04-29 MED ORDER — INSULIN DETEMIR 100 UNIT/ML ~~LOC~~ SOLN
44.0000 [IU] | Freq: Every day | SUBCUTANEOUS | Status: DC
  Administered 2017-04-29: 44 [IU] via SUBCUTANEOUS
  Filled 2017-04-29: qty 0.44

## 2017-04-29 NOTE — Care Management Note (Addendum)
Case Management Note  Patient Details  Name: Jeffrey [name] MRN: [id number] Date of Birth: [birth year]  Subjective/Objective:   From home with spouse, presents with resp failure, small NSTEMI, ? pnaon heparin drip, has home oxygen (2 liters ) with [employer], will benefit from pt/ot eval.  He has [facility] [employer].  6/6 Tomi Bamberger RN, BSN - s/p heart cath today, noting 65% prox LAD lesion and 1005% mid RCA, will treat with medical therapy trial.   WBC 13.1, hgb 9.3, na 128.    6/7 Tomi Bamberger RN, BSN - wife is with patient all the time, she states she takes care of him, he would like to have a manual w/chair to help him get to HD.  NCM left note for MD to order this for patient.  Wife states she helps him with his medications also.   PCP  Gwenlyn Perking              Action/Plan: NCM will follow for dc needs. Expected Discharge Date:  04/30/17               Expected Discharge Plan:  Home/Self Care  In-House Referral:     Discharge planning Services  CM Consult  Post Acute Care Choice:    Choice offered to:     DME Arranged:    DME Agency:     HH Arranged:    HH Agency:     Status of Service:  In process, will continue to follow  If discussed at Long Length of Stay Meetings, dates discussed:    Additional Comments:  Zenon Mayo, RN 04/29/2017, 12:51 PM

## 2017-04-29 NOTE — Progress Notes (Signed)
[place] TEAM 1 - Stepdown/ICU TEAM  Jeffrey [name]  KDT:[id number] DOB: [birth year] DOA: 04/27/2017 PCP: Thurman Coyer, MD    Brief Narrative:  76 y.o.M Hx ESRD on HD M/W/F, atrial fibrillation S/P pacemaker, CAD with chronic RCA occlusion, severe aortic stenosis S/P Bioprosthetic TAVR, chronic systolic and diastolic CHF (EF 99-83% w/ grade 1 diastolic dysfunction on echo 07/19/16), COPD, and DM2 who presented with chest pain which radiated into his L shoulder associated w/ SOB.    Subjective: The patient is seen on dialysis.  He reports complete resolution of his chest discomfort.  He denies shortness of breath fevers chills nausea or vomiting.  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   DVT prophylaxis: IV heparin  Code Status: FULL CODE Family Communication: no family present at time of exam  Disposition Plan: SDU  Consultants:  Cardiology Nephrology   Procedures: none  Antimicrobials:  Levaquin 6/3 >  Objective: Blood pressure 130/64, pulse 72, temperature 97.8 F (36.6 C), temperature source Oral, resp. rate 16, height 5\' 11"  (1.803 m), weight 106.1 kg (233 lb 14.5 oz), SpO2 96  %.  Intake/Output Summary (Last 24 hours) at 04/29/17 1449 Last data filed at 04/29/17 0900  Gross per 24 hour  Intake          1794.67 ml  Output              400 ml  Net          1394.67 ml   Filed Weights   04/28/17 0319 04/29/17 0500 04/29/17 1220  Weight: 104 kg (229 lb 4.5 oz) 104.4 kg (230 lb 1.6 oz) 106.1 kg (233 lb 14.5 oz)    Examination: General: No acute respiratory distress Lungs: Clear to auscultation bilaterally without wheezes or crackles Cardiovascular: Regular rate and rhythm without murmur gallop or rub normal S1 and S2 Abdomen: Nontender, nondistended, soft, bowel sounds positive, no rebound, no ascites, no appreciable mass Extremities: No significant cyanosis, clubbing, or edema bilateral lower extremities  CBC:  Recent Labs Lab 04/27/17 1352 04/28/17 0541 04/29/17 0401  WBC 20.8* 16.1* 14.7*  HGB 10.0* 8.7* 9.0*  HCT 31.0* 27.5* 28.2*  MCV 95.1 94.5 94.0  PLT 401* 313 382   Basic Metabolic Panel:  Recent Labs Lab 04/27/17 1352 04/28/17 0541 04/28/17 1053 04/29/17 0401  NA 133* 132* 132* 134*  K 4.7 4.1 4.0 4.9  CL 95* 92* 94* 92*  CO2 26 27 26 29   GLUCOSE 436* 457* 357* 267*  BUN 44* 31* 36* 49*  CREATININE 3.76* 2.80* 3.08* 3.98*  CALCIUM 8.6* 8.1* 8.5* 8.2*  MG  --   --   --  2.0  PHOS  --   --  3.6  --  GFR: Estimated Creatinine Clearance: 19 mL/min (A) (by C-G formula based on SCr of 3.98 mg/dL (H)).  Liver Function Tests:  Recent Labs Lab 04/27/17 1357 04/28/17 1053  AST 20  --   ALT 12*  --   ALKPHOS 98  --   BILITOT 1.2  --   PROT 7.0  --   ALBUMIN 3.1* 2.9*    Cardiac Enzymes:  Recent Labs Lab 04/27/17 2230 04/28/17 0541 04/28/17 1053  TROPONINI 1.44* 1.39* 1.86*    HbA1C: Hgb A1c MFr Bld  Date/Time Value Ref Range Status  08/01/2015 12:50 PM 5.6 4.8 - 5.6 % Final    Comment:    (NOTE)         Pre-diabetes: 5.7 - 6.4         Diabetes: >6.4         Glycemic control for adults with diabetes: <7.0    04/27/2015 10:45 PM 6.7 (H) 4.8 - 5.6 % Final    Comment:    (NOTE)         Pre-diabetes: 5.7 - 6.4         Diabetes: >6.4         Glycemic control for adults with diabetes: <7.0     CBG:  Recent Labs Lab 04/28/17 0757 04/28/17 1129 04/28/17 1734 04/28/17 2057 04/29/17 0754  GLUCAP 437* 351* 171* 176* 269*    Recent Results (from the past 240 hour(s))  MRSA PCR Screening     Status: None   Collection Time: 04/28/17  9:58 AM  Result Value Ref Range Status   MRSA by PCR NEGATIVE NEGATIVE Final    Comment:        The GeneXpert MRSA Assay (FDA approved for NASAL specimens only), is one component of a comprehensive MRSA colonization surveillance program. It is not intended to diagnose MRSA infection nor to guide or monitor treatment for MRSA infections.   Respiratory Panel by PCR     Status: None   Collection Time: 04/28/17  4:07 PM  Result Value Ref Range Status   Adenovirus NOT DETECTED NOT DETECTED Final   Coronavirus 229E NOT DETECTED NOT DETECTED Final   Coronavirus HKU1 NOT DETECTED NOT DETECTED Final   Coronavirus NL63 NOT DETECTED NOT DETECTED Final   Coronavirus OC43 NOT DETECTED NOT DETECTED Final   Metapneumovirus NOT DETECTED NOT DETECTED Final   Rhinovirus / Enterovirus NOT DETECTED NOT DETECTED Final   Influenza A NOT DETECTED NOT DETECTED Final   Influenza B NOT DETECTED NOT DETECTED Final   Parainfluenza Virus 1 NOT DETECTED NOT DETECTED Final   Parainfluenza Virus 2 NOT DETECTED NOT DETECTED Final   Parainfluenza Virus 3 NOT DETECTED NOT DETECTED Final   Parainfluenza Virus 4 NOT DETECTED NOT DETECTED Final   Respiratory Syncytial Virus NOT DETECTED NOT DETECTED Final   Bordetella pertussis NOT DETECTED NOT DETECTED Final   Chlamydophila pneumoniae NOT DETECTED NOT DETECTED Final   Mycoplasma pneumoniae NOT DETECTED NOT DETECTED Final     Scheduled Meds: . amLODipine  10 mg Oral QHS  . aspirin EC  325 mg Oral Daily  . atorvastatin  40 mg  Oral Daily  . cinacalcet  30 mg Oral Q supper  . famotidine  20 mg Oral Daily  . feeding supplement (NEPRO CARB STEADY)  237 mL Oral BID BM  . insulin aspart  0-20 Units Subcutaneous TID WC  . insulin aspart  0-5 Units Subcutaneous QHS  . insulin aspart  6 Units Subcutaneous TID WC  .  insulin detemir  42 Units Subcutaneous QHS  . ipratropium-albuterol  3 mL Nebulization TID  . [START ON 04/30/2017] levofloxacin  500 mg Oral Q48H  . mouth rinse  15 mL Mouth Rinse BID  . metoprolol tartrate  12.5 mg Oral BID  . mometasone-formoterol  2 puff Inhalation BID  . multivitamin  1 tablet Oral QHS  . predniSONE  50 mg Oral Q breakfast  . sevelamer carbonate  1,600 mg Oral BID  . sevelamer carbonate  3,200 mg Oral TID WC  . sodium chloride flush  3 mL Intravenous Q12H  . sodium chloride flush  3 mL Intravenous Q12H     LOS: 2 days   Cherene Altes, MD [facility] Office  [phone] Pager - Text Page per Shea Evans as per below:  On-Call/Text Page:      [email or web address]      password [email or web address]  If 7PM-7AM, please contact night-coverage [email or web address] Password [email or web address] 04/29/2017, 2:49 PM

## 2017-04-29 NOTE — Progress Notes (Signed)
ANTICOAGULATION CONSULT NOTE - Follow-up Consult  Pharmacy Consult for Heparin Indication: chest pain/ACS  Allergies  Allergen Reactions  . Byetta 10 Mcg Pen [Exenatide] Diarrhea and Nausea And Vomiting  . Codeine Itching    Patient Measurements: Height: 5\' 11"  (180.3 cm) Weight: 230 lb 1.6 oz (104.4 kg) IBW/kg (Calculated) : 75.3 Heparin Dosing Weight: 102 kg  Assessment: 76 year old male on heparin for CP. No AC PTA. Last heparin level was therapeutic at 0.37. Hgb low but stable at 9.0, plts wnl.  Goal of Therapy:  Heparin level 0.3-0.7 units/ml Monitor platelets by anticoagulation protocol: Yes   Plan:  Continue heparin gtt at 1,400 units/hr Monitor daily heparin level, CBC, s/s of bleed F/U cards plans   Elenor Quinones, PharmD, [facility] Clinical Pharmacist Pager [phone] 04/29/2017 8:03 AM

## 2017-04-29 NOTE — Progress Notes (Signed)
Admit: 04/27/2017 LOS: 2  38M ESRD [place] with NSTEMI, hypervolemia  Subjective:  Says breathing is improved but not resolved Denies angina For dialysis later today   06/03 0701 - 06/04 0700 In: 1072 [P.O.:750; I.V.:322] Out: 400 [Urine:400]  Filed Weights   04/27/17 2232 04/28/17 0319 04/29/17 0500  Weight: 104 kg (229 lb 4.5 oz) 104 kg (229 lb 4.5 oz) 104.4 kg (230 lb 1.6 oz)    Scheduled Meds: . amLODipine  10 mg Oral QHS  . aspirin EC  325 mg Oral Daily  . atorvastatin  40 mg Oral Daily  . cinacalcet  30 mg Oral Q supper  . famotidine  20 mg Oral Daily  . feeding supplement (NEPRO CARB STEADY)  237 mL Oral BID BM  . insulin aspart  0-20 Units Subcutaneous TID WC  . insulin aspart  0-5 Units Subcutaneous QHS  . insulin aspart  6 Units Subcutaneous TID WC  . insulin detemir  42 Units Subcutaneous QHS  . ipratropium-albuterol  3 mL Nebulization TID  . [START ON 04/30/2017] levofloxacin  500 mg Oral Q48H  . mouth rinse  15 mL Mouth Rinse BID  . metoprolol tartrate  12.5 mg Oral BID  . mometasone-formoterol  2 puff Inhalation BID  . multivitamin  1 tablet Oral QHS  . predniSONE  50 mg Oral Q breakfast  . sevelamer carbonate  1,600 mg Oral BID  . sevelamer carbonate  3,200 mg Oral TID WC  . sodium chloride flush  3 mL Intravenous Q12H  . sodium chloride flush  3 mL Intravenous Q12H   Continuous Infusions: . sodium chloride    . ferric gluconate (FERRLECIT/NULECIT) IV    . heparin 1,400 Units/hr (04/29/17 0620)   PRN Meds:.sodium chloride, acetaminophen **OR** acetaminophen, albuterol, calcium carbonate, morphine injection, ondansetron **OR** ondansetron (ZOFRAN) IV, sodium chloride flush  Current Labs: reviewed    Physical Exam:  Blood pressure (!) 156/65, pulse 75, temperature 97.7 F (36.5 C), temperature source Oral, resp. rate 13, height 5\' 11"  (1.803 m), weight 104.4 kg (230 lb 1.6 oz), SpO2 96 %. NAD, obese Interrupts full sentences to catch breath, lungs  vesicular and diminished in the bases otherwise clear RRR Trace edema and lower extremity is bilaterally, left slightly worse than right LUE AV fistula with bruit and thrill Nonfocal with intact cranial nerves II through XII  HD orders: MWF [place] 4 hrs 400/800 160 NRe  2.0 K/2.5 Ca 103 kg -No heparin -Mircera 100 mcg IV Q 2 weeks (last dose 04/24/17 HGB 9.0 04/24/17) -Venofer 100 mg IV X 5 (2/5 doses given Fe 45 Tsat 27% 0523/18) -Hectoral 2 mcg IV TIW (Last PTH 194 04/17/17)  BMD meds: -Sensipar 30 mg PO daily -Renvela 800 mg 4 tabs PO TID AC 2 tabs PO BID w/snack (last Phos 4.2 Ca  8.1 C Ca 8.8 04/17/17)  A 1. ESRD MWF [place] 2. SOB / Pulm Edema / Hypoxia 3. NSTEMI, being medically managed 4. Progressive Debility over past several weeks 5. ANemia 6. 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 HTN, on MTP, amlodipin 8. S/p TAVR 9. Leukocytosis, down trending  P 1. HD today on schedule, target UF 3.5 L, need post weights, 2K bath 2. Reassess after dialysis for potential additional ultrafiltration 3. Question if amlodipine should be discontinued to permit further dose titration of metoprolol, will let cardiology consider   Pearson Grippe MD 04/29/2017, 9:22 AM   Recent Labs Lab 04/28/17 0541 04/28/17 1053 04/29/17 0401  NA 132* 132* 134*  K 4.1 4.0 4.9  CL 92* 94* 92*  CO2 27 26 29   GLUCOSE 457* 357* 267*  BUN 31* 36* 49*  CREATININE 2.80* 3.08* 3.98*  CALCIUM 8.1* 8.5* 8.2*  PHOS  --  3.6  --     Recent Labs Lab 04/27/17 1352 04/28/17 0541 04/29/17 0401  WBC 20.8* 16.1* 14.7*  HGB 10.0* 8.7* 9.0*  HCT 31.0* 27.5* 28.2*  MCV 95.1 94.5 94.0  PLT 401* 313 281

## 2017-04-29 NOTE — Progress Notes (Signed)
Progress Note  Patient Name: Jeffrey [name] Date of Encounter: 04/29/2017  Primary Cardiologist: Lequita Halt / Marlou Porch  Subjective   Feels very good this am no dyspnea wondering when dialysis will be   Inpatient Medications    Scheduled Meds: . amLODipine  10 mg Oral QHS  . aspirin EC  325 mg Oral Daily  . atorvastatin  40 mg Oral Daily  . cinacalcet  30 mg Oral Q supper  . famotidine  20 mg Oral Daily  . feeding supplement (NEPRO CARB STEADY)  237 mL Oral BID BM  . insulin aspart  0-20 Units Subcutaneous TID WC  . insulin aspart  0-5 Units Subcutaneous QHS  . insulin aspart  6 Units Subcutaneous TID WC  . insulin detemir  42 Units Subcutaneous QHS  . ipratropium-albuterol  3 mL Nebulization TID  . [START ON 04/30/2017] levofloxacin  500 mg Oral Q48H  . mouth rinse  15 mL Mouth Rinse BID  . metoprolol tartrate  12.5 mg Oral BID  . mometasone-formoterol  2 puff Inhalation BID  . multivitamin  1 tablet Oral QHS  . predniSONE  50 mg Oral Q breakfast  . sevelamer carbonate  1,600 mg Oral BID  . sevelamer carbonate  3,200 mg Oral TID WC  . sodium chloride flush  3 mL Intravenous Q12H  . sodium chloride flush  3 mL Intravenous Q12H   Continuous Infusions: . sodium chloride    . ferric gluconate (FERRLECIT/NULECIT) IV    . heparin 1,400 Units/hr (04/29/17 0620)   PRN Meds: sodium chloride, acetaminophen **OR** acetaminophen, albuterol, calcium carbonate, morphine injection, ondansetron **OR** ondansetron (ZOFRAN) IV, sodium chloride flush   Vital Signs    Vitals:   04/29/17 0500 04/29/17 0751 04/29/17 0752 04/29/17 0759  BP:    (!) 156/65  Pulse:    75  Resp:    13  Temp:    97.7 F (36.5 C)  TempSrc:    Oral  SpO2:  92% 93% 96%  Weight: 230 lb 1.6 oz (104.4 kg)     Height:        Intake/Output Summary (Last 24 hours) at 04/29/17 1101 Last data filed at 04/29/17 0900  Gross per 24 hour  Intake          1794.67 ml  Output              400 ml  Net          1394.67 ml     Filed Weights   04/27/17 2232 04/28/17 0319 04/29/17 0500  Weight: 229 lb 4.5 oz (104 kg) 229 lb 4.5 oz (104 kg) 230 lb 1.6 oz (104.4 kg)    Telemetry    V pacing - Personally Reviewed  ECG    Paced - Personally Reviewed  Physical Exam  Fistula LUE with thrill Pacer under left clavicle Obese white male  GEN: No acute distress.   Neck: No JVD Cardiac: RRR, no murmurs, rubs, or gallops.  Respiratory: Clear to auscultation bilaterally. GI: Soft, nontender, non-distended  MS: No edema; No deformity. Neuro:  Nonfocal  Psych: Normal affect   Labs    Chemistry  Recent Labs Lab 04/27/17 1357 04/28/17 0541 04/28/17 1053 04/29/17 0401  NA  --  132* 132* 134*  K  --  4.1 4.0 4.9  CL  --  92* 94* 92*  CO2  --  27 26 29   GLUCOSE  --  457* 357* 267*  BUN  --  31* 36*  49*  CREATININE  --  2.80* 3.08* 3.98*  CALCIUM  --  8.1* 8.5* 8.2*  PROT 7.0  --   --   --   ALBUMIN 3.1*  --  2.9*  --   AST 20  --   --   --   ALT 12*  --   --   --   ALKPHOS 98  --   --   --   BILITOT 1.2  --   --   --   GFRNONAA  --  20* 18* 13*  GFRAA  --  23* 21* 15*  ANIONGAP  --  13 12 13      Hematology  Recent Labs Lab 04/27/17 1352 04/28/17 0541 04/29/17 0401  WBC 20.8* 16.1* 14.7*  RBC 3.26* 2.91* 3.00*  HGB 10.0* 8.7* 9.0*  HCT 31.0* 27.5* 28.2*  MCV 95.1 94.5 94.0  MCH 30.7 29.9 30.0  MCHC 32.3 31.6 31.9  RDW 16.7* 17.0* 17.3*  PLT 401* 313 281    Cardiac Enzymes  Recent Labs Lab 04/27/17 2230 04/28/17 0541 04/28/17 1053  TROPONINI 1.44* 1.39* 1.86*     Recent Labs Lab 04/27/17 1411  TROPIPOC 0.13*     BNP  Recent Labs Lab 04/27/17 1352  BNP 1,193.6*     DDimer No results for input(s): DDIMER in the last 168 hours.   Radiology    Dg Chest Portable 1 View  Result Date: 04/27/2017 CLINICAL DATA:  Chest pain EXAM: PORTABLE CHEST 1 VIEW COMPARISON:  04/16/2017 FINDINGS: The cardio pericardial silhouette is enlarged. Asymmetric airspace disease noted,  diffusely on the right and involving the left base. Probable small bilateral effusions. The visualized bony structures of the thorax are intact. Telemetry leads overlie the chest. IMPRESSION: Lateral airspace disease compatible with diffuse infection or asymmetric pulmonary edema. Electronically Signed   By: [name] Stanley M.D.   On: 04/27/2017 14:25    Cardiac Studies   Echo reviewed 04/28/17  EF 40% TAVR valve no perivalvular leak mean gradient 15 mmHg  Patient Profile     76 year old male with known coronary artery disease, end-stage renal disease on hemodialysis aortic valve replacement via TAVR, with ischemic cardiomyopathy, slightly worsened with elevated troponin of 1.8 and recent chest pain concerning for non-ST elevation myocardial infarction.  Assessment & Plan    CAD:  Last cath 06/2015 with total RCA collateralized and 50% mid LAD See note from Dr Marlou Porch plan medical Rx per patient request  TAVR:  SEM on exam no AR gradients reasonable Needs SBE CRF:  Fistula LUE ok dialysis today   Will arrange outpatient f/u with Dr Marlou Porch  Signed, Jenkins Rouge, MD  04/29/2017, 11:01 AM

## 2017-04-30 ENCOUNTER — Encounter: Payer: Medicare Other | Admitting: Adult Health

## 2017-04-30 LAB — LIPID PANEL
Cholesterol: 95 mg/dL (ref 0–200)
HDL: 55 mg/dL (ref >40)
LDL Cholesterol: 30 mg/dL (ref 0–99)
TRIGLYCERIDES: 51 mg/dL (ref <150)
Total CHOL/HDL Ratio: 1.7 RATIO
VLDL: 10 mg/dL (ref 0–40)

## 2017-04-30 LAB — CBC
HCT: 29.3 % — ABNORMAL LOW (ref 39.0–52.0)
HEMOGLOBIN: 9.3 g/dL — AB (ref 13.0–17.0)
MCH: 29.9 pg (ref 26.0–34.0)
MCHC: 31.7 g/dL (ref 30.0–36.0)
MCV: 94.2 fL (ref 78.0–100.0)
Platelets: 259 10*3/uL (ref 150–400)
RBC: 3.11 MIL/uL — AB (ref 4.22–5.81)
RDW: 17.5 % — ABNORMAL HIGH (ref 11.5–15.5)
WBC: 13.2 10*3/uL — ABNORMAL HIGH (ref 4.0–10.5)

## 2017-04-30 LAB — HEPARIN LEVEL (UNFRACTIONATED)
HEPARIN UNFRACTIONATED: 0.17 [IU]/mL — AB (ref 0.30–0.70)
HEPARIN UNFRACTIONATED: 0.32 [IU]/mL (ref 0.30–0.70)
Heparin Unfractionated: 0.53 IU/mL (ref 0.30–0.70)

## 2017-04-30 LAB — BASIC METABOLIC PANEL
ANION GAP: 11 (ref 5–15)
BUN: 30 mg/dL — ABNORMAL HIGH (ref 6–20)
CHLORIDE: 93 mmol/L — AB (ref 101–111)
CO2: 27 mmol/L (ref 22–32)
Calcium: 7.8 mg/dL — ABNORMAL LOW (ref 8.9–10.3)
Creatinine, Ser: 3.02 mg/dL — ABNORMAL HIGH (ref 0.61–1.24)
GFR, EST AFRICAN AMERICAN: 21 mL/min — AB (ref >60)
GFR, EST NON AFRICAN AMERICAN: 18 mL/min — AB (ref >60)
Glucose, Bld: 308 mg/dL — ABNORMAL HIGH (ref 65–99)
POTASSIUM: 4 mmol/L (ref 3.5–5.1)
SODIUM: 131 mmol/L — AB (ref 135–145)

## 2017-04-30 LAB — GLUCOSE, CAPILLARY
GLUCOSE-CAPILLARY: 377 mg/dL — AB (ref 65–99)
GLUCOSE-CAPILLARY: 267 mg/dL — AB (ref 65–99)
GLUCOSE-CAPILLARY: 189 mg/dL — AB (ref 65–99)
Glucose-Capillary: 156 mg/dL — ABNORMAL HIGH (ref 65–99)

## 2017-04-30 LAB — HEMOGLOBIN A1C
Hgb A1c MFr Bld: 7 % — ABNORMAL HIGH (ref 4.8–5.6)
Mean Plasma Glucose: 154 mg/dL

## 2017-04-30 MED ORDER — ISOSORBIDE DINITRATE 10 MG PO TABS
10.0000 mg | ORAL_TABLET | Freq: Three times a day (TID) | ORAL | Status: DC
  Administered 2017-04-30 – 2017-05-01 (×4): 10 mg via ORAL
  Filled 2017-04-30 (×5): qty 1

## 2017-04-30 MED ORDER — INSULIN DETEMIR 100 UNIT/ML ~~LOC~~ SOLN
48.0000 [IU] | Freq: Every day | SUBCUTANEOUS | Status: DC
  Administered 2017-04-30 – 2017-05-02 (×3): 48 [IU] via SUBCUTANEOUS
  Filled 2017-04-30 (×4): qty 0.48

## 2017-04-30 MED ORDER — INSULIN ASPART 100 UNIT/ML ~~LOC~~ SOLN
12.0000 [IU] | Freq: Three times a day (TID) | SUBCUTANEOUS | Status: DC
  Administered 2017-04-30 – 2017-05-03 (×7): 12 [IU] via SUBCUTANEOUS

## 2017-04-30 MED ORDER — METOPROLOL TARTRATE 25 MG PO TABS
25.0000 mg | ORAL_TABLET | Freq: Two times a day (BID) | ORAL | Status: DC
  Administered 2017-04-30 – 2017-05-03 (×6): 25 mg via ORAL
  Filled 2017-04-30 (×6): qty 1

## 2017-04-30 NOTE — Progress Notes (Signed)
ANTICOAGULATION CONSULT NOTE - Follow-up Consult  Pharmacy Consult for Heparin Indication: chest pain/ACS  Allergies  Allergen Reactions  . Byetta 10 Mcg Pen [Exenatide] Diarrhea and Nausea And Vomiting  . Codeine Itching    Patient Measurements: Height: 5\' 11"  (180.3 cm) Weight: 227 lb 11.8 oz (103.3 kg) IBW/kg (Calculated) : 75.3 Heparin Dosing Weight: 102 kg  Vital Signs: Temp: 98.4 F (36.9 C) (06/05 1147) Temp Source: Oral (06/05 1147) BP: 149/57 (06/05 1147) Pulse Rate: 69 (06/05 1147)  Labs:  Recent Labs  04/27/17 2230  04/28/17 0541 04/28/17 1053  04/29/17 0401 04/30/17 0242 04/30/17 1128  HGB  --   < > 8.7*  --   --  9.0* 9.3*  --   HCT  --   --  27.5*  --   --  28.2* 29.3*  --   PLT  --   --  313  --   --  281 259  --   HEPARINUNFRC  --   < >  --  <0.10*  < > 0.37 0.17* 0.32  CREATININE  --   < > 2.80* 3.08*  --  3.98* 3.02*  --   TROPONINI 1.44*  --  1.39* 1.86*  --   --   --   --   < > = values in this interval not displayed.  Estimated Creatinine Clearance: 24.7 mL/min (A) (by C-G formula based on SCr of 3.02 mg/dL (H)).   Assessment: 76 year old male on heparin for CP. No AC PTA. Last heparin level was therapeutic at 0.32 after rate increase. Hgb low but stable at 9.3, plts wnl.  Goal of Therapy:  Heparin level 0.3-0.7 units/ml Monitor platelets by anticoagulation protocol: Yes   Plan:  Continue heparin gtt at 1,600 units/hr Monitor daily heparin level, CBC, s/s of bleed F/U cath tomorrow  Elenor Quinones, PharmD, BCPS Clinical Pharmacist Pager [phone] 04/30/2017 1:57 PM

## 2017-04-30 NOTE — Progress Notes (Signed)
Admit: 04/27/2017 LOS: 3  36M ESRD [place] with NSTEMI, hypervolemia  Subjective:  Feels improved, but still developed angina and dyspnea when using walker to bathroom last evening Wants to regain independence, be more ambulatory [place] Yesterday 3.5L UF, post weight 102.6kg  BP stable post HD  06/04 0701 - 06/05 0700 In: 1468 [P.O.:1050; I.V.:308; IV Piggyback:110] Out: 3825 [Urine:325]  Filed Weights   04/29/17 1220 04/29/17 1630 04/30/17 0451  Weight: 106.1 kg (233 lb 14.5 oz) 102.6 kg (226 lb 3.1 oz) 103.3 kg (227 lb 11.8 oz)    Scheduled Meds: . amLODipine  10 mg Oral QHS  . aspirin EC  325 mg Oral Daily  . atorvastatin  40 mg Oral Daily  . cinacalcet  30 mg Oral Q supper  . famotidine  20 mg Oral Daily  . feeding supplement (NEPRO CARB STEADY)  237 mL Oral BID BM  . insulin aspart  0-20 Units Subcutaneous TID WC  . insulin aspart  0-5 Units Subcutaneous QHS  . insulin aspart  12 Units Subcutaneous TID WC  . insulin detemir  48 Units Subcutaneous QHS  . ipratropium-albuterol  3 mL Nebulization TID  . levofloxacin  500 mg Oral Q48H  . mouth rinse  15 mL Mouth Rinse BID  . metoprolol tartrate  12.5 mg Oral BID  . mometasone-formoterol  2 puff Inhalation BID  . multivitamin  1 tablet Oral QHS  . predniSONE  50 mg Oral Q breakfast  . sevelamer carbonate  1,600 mg Oral BID  . sevelamer carbonate  3,200 mg Oral TID WC  . sodium chloride flush  3 mL Intravenous Q12H   Continuous Infusions: . sodium chloride    . ferric gluconate (FERRLECIT/NULECIT) IV Stopped (04/29/17 1630)  . heparin 1,600 Units/hr (04/30/17 0419)   PRN Meds:.sodium chloride, acetaminophen **OR** acetaminophen, albuterol, calcium carbonate, morphine injection, ondansetron **OR** ondansetron (ZOFRAN) IV, sodium chloride flush  Current Labs: reviewed    Physical Exam:  Blood pressure 122/61, pulse 78, temperature 97.8 F (36.6 C), temperature source  Oral, resp. rate 17, height 5\' 11"  (1.803 m), weight 103.3 kg (227 lb 11.8 oz), SpO2 97 %. NAD, obese Less winded with speech, improved aeration in bases RRR Trace edema and lower extremity is bilateraly seems better today LUE AV fistula with bruit and thrill Nonfocal with intact cranial nerves II through XII  HD orders: MWF [place] 4 hrs 400/800 160 NRe  2.0 K/2.5 Ca 103 kg -No heparin -Mircera 100 mcg IV Q 2 weeks (last dose 04/24/17 HGB 9.0 04/24/17) -Venofer 100 mg IV X 5 (2/5 doses given Fe 45 Tsat 27% 0523/18) -Hectoral 2 mcg IV TIW (Last PTH 194 04/17/17)  BMD meds: -Sensipar 30 mg PO daily -Renvela 800 mg 4 tabs PO TID AC 2 tabs PO BID w/snack (last Phos 4.2 Ca  8.1 C Ca 8.8 04/17/17)  A 1. ESRD MWF [place] 2. SOB / Pulm Edema / Hypoxia 3. NSTEMI, being medically managed 4. Progressive Debility over past several weeks 5. ANemia 6. 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 HTN, on MTP, amlodipin 8. S/p TAVR 9. Hx/o CHB, s/p PPM 10. Leukocytosis, down trending  P 1. HD on schedule, probe for lower EDW,  2. Need standing post weights 3. Question if amlodipine should be discontinued to permit further dose titration of metoprolol, will let cardiology consider   Pearson Grippe MD 04/30/2017, 9:42 AM   Recent Labs Lab 04/28/17 1053 04/29/17 0401 04/30/17  0242  NA 132* 134* 131*  K 4.0 4.9 4.0  CL 94* 92* 93*  CO2 26 29 27   GLUCOSE 357* 267* 308*  BUN 36* 49* 30*  CREATININE 3.08* 3.98* 3.02*  CALCIUM 8.5* 8.2* 7.8*  PHOS 3.6  --   --     Recent Labs Lab 04/28/17 0541 04/29/17 0401 04/30/17 0242  WBC 16.1* 14.7* 13.2*  HGB 8.7* 9.0* 9.3*  HCT 27.5* 28.2* 29.3*  MCV 94.5 94.0 94.2  PLT 313 281 259

## 2017-04-30 NOTE — Progress Notes (Signed)
Pt watching video #115 cardiac cath, PCI and stents

## 2017-04-30 NOTE — Progress Notes (Signed)
Progress Note  Patient Name: Jeffrey [name] Date of Encounter: 04/30/2017  Primary Cardiologist: Lequita Halt / Marlou Porch  Subjective   Pt had chest pressure last evening for about 20 minutes while the nurse was working on his IV's. His breathing is better, but he is still short of breath with minimal exertion and has orthopnea. He has been using a wheel chair due to activity intolerance and would llike to be able to get back to using a walker or cane.   Inpatient Medications    Scheduled Meds: . amLODipine  10 mg Oral QHS  . aspirin EC  325 mg Oral Daily  . atorvastatin  40 mg Oral Daily  . cinacalcet  30 mg Oral Q supper  . famotidine  20 mg Oral Daily  . feeding supplement (NEPRO CARB STEADY)  237 mL Oral BID BM  . insulin aspart  0-20 Units Subcutaneous TID WC  . insulin aspart  0-5 Units Subcutaneous QHS  . insulin aspart  12 Units Subcutaneous TID WC  . insulin detemir  48 Units Subcutaneous QHS  . ipratropium-albuterol  3 mL Nebulization TID  . levofloxacin  500 mg Oral Q48H  . mouth rinse  15 mL Mouth Rinse BID  . metoprolol tartrate  12.5 mg Oral BID  . mometasone-formoterol  2 puff Inhalation BID  . multivitamin  1 tablet Oral QHS  . predniSONE  50 mg Oral Q breakfast  . sevelamer carbonate  1,600 mg Oral BID  . sevelamer carbonate  3,200 mg Oral TID WC  . sodium chloride flush  3 mL Intravenous Q12H   Continuous Infusions: . sodium chloride    . ferric gluconate (FERRLECIT/NULECIT) IV Stopped (04/29/17 1630)  . heparin 1,600 Units/hr (04/30/17 0419)   PRN Meds: sodium chloride, acetaminophen **OR** acetaminophen, albuterol, calcium carbonate, morphine injection, ondansetron **OR** ondansetron (ZOFRAN) IV, sodium chloride flush   Vital Signs    Vitals:   04/30/17 0451 04/30/17 0748 04/30/17 0811 04/30/17 0812  BP:  122/61    Pulse:  78    Resp:  17    Temp:  97.8 F (36.6 C)    TempSrc:  Oral    SpO2:  95% 97% 97%  Weight: 227 lb 11.8 oz (103.3 kg)       Height:        Intake/Output Summary (Last 24 hours) at 04/30/17 0958 Last data filed at 04/30/17 0935  Gross per 24 hour  Intake          1105.33 ml  Output             3825 ml  Net         -2719.67 ml   Filed Weights   04/29/17 1220 04/29/17 1630 04/30/17 0451  Weight: 233 lb 14.5 oz (106.1 kg) 226 lb 3.1 oz (102.6 kg) 227 lb 11.8 oz (103.3 kg)    Telemetry    V pacing - Personally Reviewed  ECG    Paced - Personally Reviewed  Physical Exam  Fistula LUE with bruit and thrill Pacer under left clavicle Obese white male  GEN: No acute distress.   Neck: No JVD Cardiac: RRR, 2/6 SEM, rubs, or gallops.  Respiratory: Clear to auscultation bilaterally. GI: Soft, nontender, non-distended  MS: 1+ LE edema; No deformity. Neuro:  Nonfocal  Psych: Normal affect   Labs    Chemistry  Recent Labs Lab 04/27/17 1357  04/28/17 1053 04/29/17 0401 04/30/17 0242  NA  --   < > 132*  134* 131*  K  --   < > 4.0 4.9 4.0  CL  --   < > 94* 92* 93*  CO2  --   < > 26 29 27   GLUCOSE  --   < > 357* 267* 308*  BUN  --   < > 36* 49* 30*  CREATININE  --   < > 3.08* 3.98* 3.02*  CALCIUM  --   < > 8.5* 8.2* 7.8*  PROT 7.0  --   --   --   --   ALBUMIN 3.1*  --  2.9*  --   --   AST 20  --   --   --   --   ALT 12*  --   --   --   --   ALKPHOS 98  --   --   --   --   BILITOT 1.2  --   --   --   --   GFRNONAA  --   < > 18* 13* 18*  GFRAA  --   < > 21* 15* 21*  ANIONGAP  --   < > 12 13 11   < > = values in this interval not displayed.   Hematology  Recent Labs Lab 04/28/17 0541 04/29/17 0401 04/30/17 0242  WBC 16.1* 14.7* 13.2*  RBC 2.91* 3.00* 3.11*  HGB 8.7* 9.0* 9.3*  HCT 27.5* 28.2* 29.3*  MCV 94.5 94.0 94.2  MCH 29.9 30.0 29.9  MCHC 31.6 31.9 31.7  RDW 17.0* 17.3* 17.5*  PLT 313 281 259    Cardiac Enzymes  Recent Labs Lab 04/27/17 2230 04/28/17 0541 04/28/17 1053  TROPONINI 1.44* 1.39* 1.86*     Recent Labs Lab 04/27/17 1411  TROPIPOC 0.13*      BNP  Recent Labs Lab 04/27/17 1352  BNP 1,193.6*     DDimer No results for input(s): DDIMER in the last 168 hours.   Radiology    No results found.  Cardiac Studies   Echo  04/28/17   Study Conclusions - Left ventricle: The cavity size was normal. Wall thickness was   increased in a pattern of moderate LVH. Systolic function was   mildly to moderately reduced. The estimated ejection fraction was   40%. Diffuse hypokinesis. Doppler parameters are consistent with   restrictive physiology, indicative of decreased left ventricular   diastolic compliance and/or increased left atrial pressure.   Doppler parameters are consistent with high ventricular filling   pressure. - Regional wall motion abnormality: Hypokinesis of the basal   inferoseptal myocardium; moderate hypokinesis of the apical   anterior, apical septal, apical lateral, and apical myocardium. 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 No significant bioprosthetic valvular   stenosis. There was no regurgitation. Peak velocity (S): 290   cm/s. Mean gradient (S): 15 mm Hg. Valve area (VTI): 2.16 cm^2.   Valve area (Vmax): 2.08 cm^2. Valve area (Vmean): 2.15 cm^2. - Mitral valve: Calcified annulus. Mildly thickened leaflets .   There was moderate regurgitation. There is some acoustic   shadowing from mitral annular calcification. - Left atrium: The atrium was severely dilated. - Right ventricle: Pacer wire or catheter noted in right ventricle. - Right atrium: The atrium was mildly dilated. - Tricuspid valve: There was mild regurgitation. - Pulmonic valve: There was mild regurgitation.  Impressions: - I compared today&'s study to the study dated 07/19/16. LVEF appears   slightly better in some views and worse in others,  but appears to   be in the 40% range. The apex, apical septum, anteroapical wall,   and anterolateral walls may be slightly more hypokinetic than    before.   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  Assessment & Plan    CAD:   -Troponins mildly elevated with 1.44, 1.39, 1.86.  -Last cath 06/2015 with total RCA collateralized and 50% mid LAD -Decision not to pursue cardiac cath at present per discussion with Dr. Marlou Porch and pt and optimize medical therapy.  -Pt had some chest pressure last night and still has significant dyspnea with minimal exertion. -Pt often has low BP with dialysis. Nephrology suggests discontinuation of amlodipine to allow for up titration of BB. SBP 120's-140's. Will discuss with Dr. Johnsie Cancel possibility of stopping amlodipine, increasing metoprolol to 25 mg bid and adding isordil 10 mg TID.  -continue aspirin and statin -With ongoing symptoms will discuss cardiac cath option with Dr. Schuyler Amor. Johnsie Cancel reviewed and discussed with pt and family. Will plan for right and left heart cath tomorrow afternoon. He will have dialysis in the morning.   S/p TAVR:   -SEM on exam, echo shows no perivalvular leak, gradient 15 mmHg  CRF   -Dialysis per nephrology, managing fluid status, 3.5L UF yesterday, looking to decrease dry wt target -Fistula LUE ok   Signed, Daune Perch, NP  04/30/2017, 9:58 AM    Patient examined chart reviewed. Exam with obese chronically ill white male. SEM through TAVR valve basilar Rales plus one edema and dialysis fistula. SSCP with dyspnea this am concerning for more angina has total RCA with collaterals and myovue would not be helpful to risk stratify. Given borderline BP and need for dialysis  And elevated troponins favor right and left cath. Discussed with patient and family willing to proceed tomorrow after dialysis in am.  Stop  norvasc start isordil 10 tid.   Jenkins Rouge

## 2017-04-30 NOTE — Progress Notes (Signed)
ANTICOAGULATION CONSULT NOTE - Follow-up Consult  Pharmacy Consult for Heparin Indication: chest pain/ACS  Allergies  Allergen Reactions  . Byetta 10 Mcg Pen [Exenatide] Diarrhea and Nausea And Vomiting  . Codeine Itching    Patient Measurements: Height: 5\' 11"  (180.3 cm) Weight: 227 lb 11.8 oz (103.3 kg) IBW/kg (Calculated) : 75.3 Heparin Dosing Weight: 102 kg  Vital Signs: Temp: 97.9 F (36.6 C) (06/05 1929) Temp Source: Oral (06/05 1929) BP: 133/53 (06/05 1929) Pulse Rate: 73 (06/05 1929)  Labs:  Recent Labs  04/27/17 2230  04/28/17 0541 04/28/17 1053  04/29/17 0401 04/30/17 0242 04/30/17 1128 04/30/17 1813  HGB  --   < > 8.7*  --   --  9.0* 9.3*  --   --   HCT  --   --  27.5*  --   --  28.2* 29.3*  --   --   PLT  --   --  313  --   --  281 259  --   --   HEPARINUNFRC  --   < >  --  <0.10*  < > 0.37 0.17* 0.32 0.53  CREATININE  --   < > 2.80* 3.08*  --  3.98* 3.02*  --   --   TROPONINI 1.44*  --  1.39* 1.86*  --   --   --   --   --   < > = values in this interval not displayed.  Estimated Creatinine Clearance: 24.7 mL/min (A) (by C-G formula based on SCr of 3.02 mg/dL (H)).   Assessment: 76 year old male on heparin for CP. No AC PTA. Last heparin level was therapeutic at 0.32 after rate increase. Hgb low but stable at 9.3, plts wnl.  Repeat heparin level remains therapeutic at 0.53 on heparin 1600 units/hr. No issues with infusion or bleeding noted.  Goal of Therapy:  Heparin level 0.3-0.7 units/ml Monitor platelets by anticoagulation protocol: Yes   Plan:  Continue heparin 1600 units/hr Monitor daily heparin level, CBC, s/s of bleed F/U cath tomorrow  Andrey Cota. Diona Foley, PharmD, BCPS Clinical Pharmacist [phone] 04/30/2017 7:33 PM

## 2017-04-30 NOTE — Progress Notes (Signed)
ANTICOAGULATION CONSULT NOTE - Follow-up Consult  Pharmacy Consult for Heparin Indication: chest pain/ACS  Allergies  Allergen Reactions  . Byetta 10 Mcg Pen [Exenatide] Diarrhea and Nausea And Vomiting  . Codeine Itching    Patient Measurements: Height: 5\' 11"  (180.3 cm) Weight: 226 lb 3.1 oz (102.6 kg) IBW/kg (Calculated) : 75.3 Heparin Dosing Weight: 102 kg  Vital Signs: Temp: 98.1 F (36.7 C) (06/05 0327) Temp Source: Oral (06/05 0327) BP: 144/52 (06/05 0327) Pulse Rate: 71 (06/05 0327)  Labs:  Recent Labs  04/27/17 2230  04/28/17 0541 04/28/17 1053 04/28/17 1945 04/29/17 0401 04/30/17 0242  HGB  --   --  8.7*  --   --  9.0* 9.3*  HCT  --   --  27.5*  --   --  28.2* 29.3*  PLT  --   --  313  --   --  281 259  HEPARINUNFRC  --   < >  --  <0.10* 0.35 0.37 0.17*  CREATININE  --   --  2.80* 3.08*  --  3.98* 3.02*  TROPONINI 1.44*  --  1.39* 1.86*  --   --   --   < > = values in this interval not displayed.  Estimated Creatinine Clearance: 24.6 mL/min (A) (by C-G formula based on SCr of 3.02 mg/dL (H)).   Assessment: 76 year old male on heparin for r/o ACS. Heparin level down to subtherapeutic (0.17) on gtt at 1400 units/hr. CBC stable. No issues with bleeding reported per RN. Heparin off for 30 min as line leaking - this occurred about 0100. Since pt at low end of goal range previously will increase gtt slightly.  Goal of Therapy:  Heparin level 0.3-0.7 units/ml Monitor platelets by anticoagulation protocol: Yes   Plan:  Increase heparin drip to 1600 units / hr Will f/u 8 he heparin level  Sherlon Handing, PharmD, BCPS Clinical pharmacist, pager [phone] 04/30/2017

## 2017-04-30 NOTE — Progress Notes (Signed)
PROGRESS NOTE    Jeffrey [name]  MPN:[id number] DOB: 05-Oct-1941 DOA: 04/27/2017 PCP: Thurman Coyer, MD   Brief Narrative:  76 y.o. WM PMHx  ESRD on HD M/W/F and was last dialyzed 04/26/17, atrial fibrillation S/P  pacemaker, CAD with chronic RCA occlusion, severe aortic stenosis S/P Bioprosthetic TAVR, chronic systolic and diastolic CHF (EF 00-86 percent, diffuse hypokinesis, grade 1 diastolic dysfunction noted on echo 07/19/16), COPD, DM Type 2.   who presents with chest pain that started gradually at 8:30 this morning.  Chest pain radiated to left shoulder, took a NTG, which eased pain off for about 1 hour but then the pain came back so he took an additional NTG which eased the pain off for another hour, then when it came back the 3rd time, he took another NTG but the 3rd time it did not ease the pain off, so he came to the ED for evaluation.  Has been on oxygen at home since his previous hospital stay, and symptoms are associated with worsening shortness of breath.    ED Course:  The patient was given a sublingual nitroglycerin and placed on a nitroglycerin drip as well as BiPAP. Labs were significant for a WBC of 20.8. BNP was 1193.6, troponin 0.13, BUN 44, creatinine 3.76. ABG pH 7.407, PCO2 39.5, PO2 53.   Subjective: 6/5 currently negative CP, negative increased SOB. States last night positive CP and increased SOB about baseline.      Assessment & Plan:   Principal Problem:   Acute on chronic respiratory failure with hypoxia (HCC) Active Problems:   Type 2 diabetes mellitus with chronic kidney disease on chronic dialysis, with long-term current use of insulin (HCC)   Essential hypertension   Elevated troponin   ESRD (end stage renal disease) on dialysis [facility])   Atherosclerosis of native coronary artery of native heart with angina pectoris (HCC)   Fluid overload   Chronic combined systolic and diastolic CHF (congestive heart failure) (HCC)   Severe aortic stenosis  Hypervolemia   S/P TAVR (transcatheter aortic valve replacement)   Hyperlipidemia   Automatic implantable cardioverter-defibrillator in situ   Idiopathic peripheral neuropathy   Hyponatremia   Morbid obesity due to excess calories ([place])   Chest pain in a patient with known CAD/angina/elevated troponin/NSTEMI -Patient most likely suffered mild NSTEMI. Unfortunate patient appears to failed conservative medical management, recurrent CAP overnight. -Cardiology to perform right/left heart catheterization on 6/6 -Continue IV heparin -Strict in and out. Since admission - 3.5 L -Isosorbide dinitrate 10 mg TID -Metoprolol 25 mg BID  Severe aortic stenosis status post transcatheter aortic valve replacement -Status post TAVR.  Acute on chronic respiratory failure with hypoxia (HCC)/Acute on Chronic systolic and diastolic CHF/fluid overload -Multifactorial with volume overload, acute on chronic systolic/diastolic CHF, possible superimposed pneumonia, probable obesity hypoventilation syndrome all contributory.  -HD per nephrology -6/3 echocardiogram completed awaiting read -6/5 right/heart catheterization scheduled for 6/6   Hypertension -Amlodipine 10 mg daily -See NSTEMI   Automatic implantable cardioverter-defibrillator in situ   CAP -All of patient has been afebrile since admission, given patient's leukocytosis and significant core morbidities will treat empirically for CAP. -Complete 7 day course antibiotics -Flutter valve -Sputum and respiratory virus panel pending -Prednisone 50 mg daily -DuoNeb TID -Dulera 200/5 g twice a day  COPD - See CAP    Type 2 diabetes mellitus controlled with CKD. with long-term current use of insulin (HCC) -Increase Levemir 48 units daily at bedtime. -NovoLog 12 units QAC -Resistant SSI  Idiopathic peripheral neuropathy  -Most likely secondary to his diabetes, hemoglobin A1c pending    ESRD (end stage renal disease) on dialysis  (HCC)/secondary hyperparathyroidism -Treatment per Nephrology consultation requested, may need additional HD. Continue Sensipar, Renvela and calcium carbonate.      Hyperlipidemia  -Continue Lipitor 40 mg daily -Lipid panel pending  Hyponatremia -Likely reflective of volume overload. Stable Recent Labs     04/27/17  1352  04/28/17  0541  04/28/17  1053  04/29/17  0401  04/30/17  0242  NA  133*  132*  132*  134*  131*     Morbid obesity due to excess calories (HCC) Body mass index is 32.08 kg/m.   Other information:   DVT prophylaxis: Therapeutic dose heparin ordered. Code Status: Full code. Family Communication:    Consultants:  Nephrology Dr. Posey Pronto,  Cardiology Dr. Candee Furbish      Procedures/Significant Events:  6/2 PCXR:Lateral airspace disease compatible with diffuse infection or asymmetric pulmonary edema. 6/3 Echocardiogram:- Left ventricle: moderate LVH. Systolic function -LVEF= 10%. Diffuse hypokinesis.  - Regional wall motion abnormality: Hypokinesis of the basal, inferoseptal myocardium; moderate hypokinesis of the apical anterior, apical septal, apical lateral, and apical myocardium. - Aortic valve: Bioprosthetic aortic valve s/p transcatheter aortic valve replacement. No significant bioprosthetic valvular stenosis. There was moderate regurgitation.  - Left atrium: severely dilated. - Right ventricle: Pacer wire or catheter noted in right ventricle.     VENTILATOR SETTINGS: NA   Cultures 6/3 respiratory virus panel pending 6/3 sputum pending    Antimicrobials: Anti-infectives    Start     Stop   04/30/17 1800  levofloxacin (LEVAQUIN) tablet 500 mg         04/29/17 2000  levofloxacin (LEVAQUIN) IVPB 500 mg  Status:  Discontinued     04/28/17 1421   04/28/17 1500  levofloxacin (LEVAQUIN) tablet 750 mg     04/28/17 1606   04/27/17 1930  levofloxacin (LEVAQUIN) IVPB 750 mg  Status:  Discontinued     04/28/17 1421        Devices    LINES / TUBES:      Continuous Infusions: . sodium chloride    . ferric gluconate (FERRLECIT/NULECIT) IV Stopped (04/29/17 1630)  . heparin 1,600 Units/hr (04/30/17 0419)     Objective: Vitals:   04/29/17 2257 04/30/17 0327 04/30/17 0451 04/30/17 0748  BP: (!) 148/60 (!) 144/52  122/61  Pulse: 86 71  78  Resp: (!) 23 12  17   Temp: 98 F (36.7 C) 98.1 F (36.7 C)  97.8 F (36.6 C)  TempSrc: Oral Oral  Oral  SpO2: 96% 93%  95%  Weight:   227 lb 11.8 oz (103.3 kg)   Height:        Intake/Output Summary (Last 24 hours) at 04/30/17 0805 Last data filed at 04/30/17 0400  Gross per 24 hour  Intake             1468 ml  Output             3825 ml  Net            -2357 ml   Filed Weights   04/29/17 1220 04/29/17 1630 04/30/17 0451  Weight: 233 lb 14.5 oz (106.1 kg) 226 lb 3.1 oz (102.6 kg) 227 lb 11.8 oz (103.3 kg)    Examination:  General: A/O 4, positive acute respiratory distress Eyes: negative scleral hemorrhage, negative anisocoria, negative icterus ENT: Negative Runny nose, negative gingival bleeding, Neck:  Negative scars, masses, torticollis, lymphadenopathy, JVD Lungs: diffuse poor air movement positive mild wheezes, negative crackles Cardiovascular: Regular rate and rhythm, loud systolic murmur, negative gallop or rub normal S1 and S2 Abdomen: negative abdominal pain, nondistended, positive soft, bowel sounds, no rebound, no ascites, no appreciable mass Extremities: No significant cyanosis, clubbing, +1-2+ edema bilateral lower extremities Skin: Negative rashes, lesions, ulcers Psychiatric:  Negative depression, negative anxiety, negative fatigue, negative mania  Central nervous system:  Cranial nerves II through XII intact, tongue/uvula midline, all extremities muscle strength 5/5, sensation intact throughout, negative dysarthria, negative expressive aphasia, negative receptive aphasia.  .     Data Reviewed: Care during the described  time interval was provided by me .  I have reviewed this patient's available data, including medical history, events of note, physical examination, and all test results as part of my evaluation. I have personally reviewed and interpreted all radiology studies.  CBC:  Recent Labs Lab 04/27/17 1352 04/28/17 0541 04/29/17 0401 04/30/17 0242  WBC 20.8* 16.1* 14.7* 13.2*  HGB 10.0* 8.7* 9.0* 9.3*  HCT 31.0* 27.5* 28.2* 29.3*  MCV 95.1 94.5 94.0 94.2  PLT 401* 313 281 741   Basic Metabolic Panel:  Recent Labs Lab 04/27/17 1352 04/28/17 0541 04/28/17 1053 04/29/17 0401 04/30/17 0242  NA 133* 132* 132* 134* 131*  K 4.7 4.1 4.0 4.9 4.0  CL 95* 92* 94* 92* 93*  CO2 26 27 26 29 27   GLUCOSE 436* 457* 357* 267* 308*  BUN 44* 31* 36* 49* 30*  CREATININE 3.76* 2.80* 3.08* 3.98* 3.02*  CALCIUM 8.6* 8.1* 8.5* 8.2* 7.8*  MG  --   --   --  2.0  --   PHOS  --   --  3.6  --   --    GFR: Estimated Creatinine Clearance: 24.7 mL/min (A) (by C-G formula based on SCr of 3.02 mg/dL (H)). Liver Function Tests:  Recent Labs Lab 04/27/17 1357 04/28/17 1053  AST 20  --   ALT 12*  --   ALKPHOS 98  --   BILITOT 1.2  --   PROT 7.0  --   ALBUMIN 3.1* 2.9*   No results for input(s): LIPASE, AMYLASE in the last 168 hours. No results for input(s): AMMONIA in the last 168 hours. Coagulation Profile: No results for input(s): INR, PROTIME in the last 168 hours. Cardiac Enzymes:  Recent Labs Lab 04/27/17 2230 04/28/17 0541 04/28/17 1053  TROPONINI 1.44* 1.39* 1.86*   BNP (last 3 results) No results for input(s): PROBNP in the last 8760 hours. HbA1C:  Recent Labs  04/29/17 0401  HGBA1C 7.0*   CBG:  Recent Labs Lab 04/28/17 2057 04/29/17 0754 04/29/17 1743 04/29/17 2119 04/30/17 0743  GLUCAP 176* 269* 178* 201* 377*   Lipid Profile: No results for input(s): CHOL, HDL, LDLCALC, TRIG, CHOLHDL, LDLDIRECT in the last 72 hours. Thyroid Function Tests: No results for input(s):  TSH, T4TOTAL, FREET4, T3FREE, THYROIDAB in the last 72 hours. Anemia Panel: No results for input(s): VITAMINB12, FOLATE, FERRITIN, TIBC, IRON, RETICCTPCT in the last 72 hours. Urine analysis:    Component Value Date/Time   COLORURINE YELLOW 09/28/2014 2257   APPEARANCEUR CLEAR 09/28/2014 2257   LABSPEC 1.013 09/28/2014 2257   PHURINE 5.5 09/28/2014 2257   GLUCOSEU 500 (A) 09/28/2014 2257   HGBUR SMALL (A) 09/28/2014 2257   BILIRUBINUR NEGATIVE 09/28/2014 2257   KETONESUR NEGATIVE 09/28/2014 2257   PROTEINUR >300 (A) 09/28/2014 2257   UROBILINOGEN 0.2 09/28/2014 2257   NITRITE  NEGATIVE 09/28/2014 2257   LEUKOCYTESUR NEGATIVE 09/28/2014 2257   Sepsis Labs: @LABRCNTIP (procalcitonin:4,lacticidven:4)  ) Recent Results (from the past 240 hour(s))  MRSA PCR Screening     Status: None   Collection Time: 04/28/17  9:58 AM  Result Value Ref Range Status   MRSA by PCR NEGATIVE NEGATIVE Final    Comment:        The GeneXpert MRSA Assay (FDA approved for NASAL specimens only), is one component of a comprehensive MRSA colonization surveillance program. It is not intended to diagnose MRSA infection nor to guide or monitor treatment for MRSA infections.   Respiratory Panel by PCR     Status: None   Collection Time: 04/28/17  4:07 PM  Result Value Ref Range Status   Adenovirus NOT DETECTED NOT DETECTED Final   Coronavirus 229E NOT DETECTED NOT DETECTED Final   Coronavirus HKU1 NOT DETECTED NOT DETECTED Final   Coronavirus NL63 NOT DETECTED NOT DETECTED Final   Coronavirus OC43 NOT DETECTED NOT DETECTED Final   Metapneumovirus NOT DETECTED NOT DETECTED Final   Rhinovirus / Enterovirus NOT DETECTED NOT DETECTED Final   Influenza A NOT DETECTED NOT DETECTED Final   Influenza B NOT DETECTED NOT DETECTED Final   Parainfluenza Virus 1 NOT DETECTED NOT DETECTED Final   Parainfluenza Virus 2 NOT DETECTED NOT DETECTED Final   Parainfluenza Virus 3 NOT DETECTED NOT DETECTED Final    Parainfluenza Virus 4 NOT DETECTED NOT DETECTED Final   Respiratory Syncytial Virus NOT DETECTED NOT DETECTED Final   Bordetella pertussis NOT DETECTED NOT DETECTED Final   Chlamydophila pneumoniae NOT DETECTED NOT DETECTED Final   Mycoplasma pneumoniae NOT DETECTED NOT DETECTED Final         Radiology Studies: No results found.      Scheduled Meds: . amLODipine  10 mg Oral QHS  . aspirin EC  325 mg Oral Daily  . atorvastatin  40 mg Oral Daily  . cinacalcet  30 mg Oral Q supper  . famotidine  20 mg Oral Daily  . feeding supplement (NEPRO CARB STEADY)  237 mL Oral BID BM  . insulin aspart  0-20 Units Subcutaneous TID WC  . insulin aspart  0-5 Units Subcutaneous QHS  . insulin aspart  8 Units Subcutaneous TID WC  . insulin detemir  44 Units Subcutaneous QHS  . ipratropium-albuterol  3 mL Nebulization TID  . levofloxacin  500 mg Oral Q48H  . mouth rinse  15 mL Mouth Rinse BID  . metoprolol tartrate  12.5 mg Oral BID  . mometasone-formoterol  2 puff Inhalation BID  . multivitamin  1 tablet Oral QHS  . predniSONE  50 mg Oral Q breakfast  . sevelamer carbonate  1,600 mg Oral BID  . sevelamer carbonate  3,200 mg Oral TID WC  . sodium chloride flush  3 mL Intravenous Q12H   Continuous Infusions: . sodium chloride    . ferric gluconate (FERRLECIT/NULECIT) IV Stopped (04/29/17 1630)  . heparin 1,600 Units/hr (04/30/17 0419)     LOS: 3 days    Time spent: 40 minutes    Derran Sear, Geraldo Docker, MD [facility] Pager [phone]   If 7PM-7AM, please contact night-coverage [email or web address] Password [email or web address] 04/30/2017, 8:05 AM

## 2017-04-30 NOTE — Progress Notes (Signed)
Bilateral Groins clipped for procedure tomorrow

## 2017-05-01 ENCOUNTER — Ambulatory Visit (HOSPITAL_COMMUNITY): Admission: RE | Admit: 2017-05-01 | Payer: Medicare Other | Source: Ambulatory Visit | Admitting: Cardiovascular Disease

## 2017-05-01 ENCOUNTER — Encounter (HOSPITAL_COMMUNITY)
Admission: EM | Disposition: A | Discharge status: Home / Self Care | Payer: Self-pay | Source: Home / Self Care | Attending: Internal Medicine

## 2017-05-01 DIAGNOSIS — Z992 Dependence on renal dialysis: Secondary | ICD-10-CM

## 2017-05-01 DIAGNOSIS — N186 End stage renal disease: Secondary | ICD-10-CM

## 2017-05-01 DIAGNOSIS — I25110 Atherosclerotic heart disease of native coronary artery with unstable angina pectoris: Secondary | ICD-10-CM

## 2017-05-01 LAB — CBC
HCT: 29.5 % — ABNORMAL LOW (ref 39.0–52.0)
HEMATOCRIT: 27.2 % — AB (ref 39.0–52.0)
HEMOGLOBIN: 9 g/dL — AB (ref 13.0–17.0)
HEMOGLOBIN: 9.3 g/dL — AB (ref 13.0–17.0)
MCH: 30.3 pg (ref 26.0–34.0)
MCH: 29.8 pg (ref 26.0–34.0)
MCHC: 33.1 g/dL (ref 30.0–36.0)
MCHC: 31.5 g/dL (ref 30.0–36.0)
MCV: 91.6 fL (ref 78.0–100.0)
MCV: 94.6 fL (ref 78.0–100.0)
PLATELETS: 209 10*3/uL (ref 150–400)
Platelets: 241 10*3/uL (ref 150–400)
RBC: 2.97 MIL/uL — ABNORMAL LOW (ref 4.22–5.81)
RBC: 3.12 MIL/uL — AB (ref 4.22–5.81)
RDW: 17.2 % — AB (ref 11.5–15.5)
RDW: 18.1 % — ABNORMAL HIGH (ref 11.5–15.5)
WBC: 14.7 10*3/uL — AB (ref 4.0–10.5)
WBC: 13.1 10*3/uL — AB (ref 4.0–10.5)

## 2017-05-01 LAB — POCT I-STAT 3, VENOUS BLOOD GAS (G3P V)
Acid-Base Excess: 7 mmol/L — ABNORMAL HIGH (ref 0.0–2.0)
Bicarbonate: 31.2 mmol/L — ABNORMAL HIGH (ref 20.0–28.0)
O2 Saturation: 71 %
PH VEN: 7.454 — AB (ref 7.250–7.430)
TCO2: 33 mmol/L (ref 0–100)
pCO2, Ven: 44.4 mmHg (ref 44.0–60.0)
pO2, Ven: 36 mmHg (ref 32.0–45.0)

## 2017-05-01 LAB — PROTIME-INR
INR: 1.27
PROTHROMBIN TIME: 16 s — AB (ref 11.4–15.2)

## 2017-05-01 LAB — POCT ACTIVATED CLOTTING TIME: Activated Clotting Time: 131 seconds

## 2017-05-01 LAB — BASIC METABOLIC PANEL
ANION GAP: 14 (ref 5–15)
BUN: 52 mg/dL — ABNORMAL HIGH (ref 6–20)
CALCIUM: 7.6 mg/dL — AB (ref 8.9–10.3)
CHLORIDE: 90 mmol/L — AB (ref 101–111)
CO2: 24 mmol/L (ref 22–32)
Creatinine, Ser: 4.46 mg/dL — ABNORMAL HIGH (ref 0.61–1.24)
GFR calc non Af Amer: 11 mL/min — ABNORMAL LOW (ref >60)
GFR, EST AFRICAN AMERICAN: 13 mL/min — AB (ref >60)
GLUCOSE: 160 mg/dL — AB (ref 65–99)
POTASSIUM: 3.8 mmol/L (ref 3.5–5.1)
Sodium: 128 mmol/L — ABNORMAL LOW (ref 135–145)

## 2017-05-01 LAB — CREATININE, SERUM
Creatinine, Ser: 2.46 mg/dL — ABNORMAL HIGH (ref 0.61–1.24)
GFR calc non Af Amer: 24 mL/min — ABNORMAL LOW (ref >60)
GFR, EST AFRICAN AMERICAN: 27 mL/min — AB (ref >60)

## 2017-05-01 LAB — GLUCOSE, CAPILLARY
GLUCOSE-CAPILLARY: 195 mg/dL — AB (ref 65–99)
Glucose-Capillary: 112 mg/dL — ABNORMAL HIGH (ref 65–99)
Glucose-Capillary: 100 mg/dL — ABNORMAL HIGH (ref 65–99)
Glucose-Capillary: 106 mg/dL — ABNORMAL HIGH (ref 65–99)
Glucose-Capillary: 232 mg/dL — ABNORMAL HIGH (ref 65–99)

## 2017-05-01 LAB — HEPARIN LEVEL (UNFRACTIONATED)
HEPARIN UNFRACTIONATED: 0.97 [IU]/mL — AB (ref 0.30–0.70)
Heparin Unfractionated: 0.73 IU/mL — ABNORMAL HIGH (ref 0.30–0.70)

## 2017-05-01 LAB — POCT I-STAT 3, ART BLOOD GAS (G3+)
ACID-BASE EXCESS: 6 mmol/L — AB (ref 0.0–2.0)
Bicarbonate: 30.1 mmol/L — ABNORMAL HIGH (ref 20.0–28.0)
O2 Saturation: 96 %
PH ART: 7.473 — AB (ref 7.350–7.450)
PO2 ART: 78 mmHg — AB (ref 83.0–108.0)
TCO2: 31 mmol/L (ref 0–100)
pCO2 arterial: 41.1 mmHg (ref 32.0–48.0)

## 2017-05-01 SURGERY — RIGHT HEART CATH AND CORONARY ANGIOGRAPHY
Anesthesia: LOCAL

## 2017-05-01 MED ORDER — FENTANYL CITRATE (PF) 100 MCG/2ML IJ SOLN
INTRAMUSCULAR | Status: DC | PRN
  Administered 2017-05-01: 25 ug via INTRAVENOUS

## 2017-05-01 MED ORDER — ONDANSETRON HCL 4 MG/2ML IJ SOLN: 4.0000 mg | Freq: Four times a day (QID) | INTRAMUSCULAR | Status: DC | PRN

## 2017-05-01 MED ORDER — HEPARIN SODIUM (PORCINE) 5000 UNIT/ML IJ SOLN
5000.0000 [IU] | Freq: Three times a day (TID) | INTRAMUSCULAR | Status: DC
  Administered 2017-05-01 – 2017-05-03 (×6): 5000 [IU] via SUBCUTANEOUS
  Filled 2017-05-01 (×5): qty 1

## 2017-05-01 MED ORDER — SODIUM CHLORIDE 0.9% FLUSH: 3.0000 mL | Freq: Two times a day (BID) | INTRAVENOUS | Status: DC

## 2017-05-01 MED ORDER — ASPIRIN EC 81 MG PO TBEC
81.0000 mg | DELAYED_RELEASE_TABLET | Freq: Every day | ORAL | Status: DC
  Administered 2017-05-02 – 2017-05-03 (×2): 81 mg via ORAL
  Filled 2017-05-01 (×2): qty 1

## 2017-05-01 MED ORDER — SODIUM CHLORIDE 0.9% FLUSH: 3.0000 mL | INTRAVENOUS | Status: DC | PRN

## 2017-05-01 MED ORDER — IOPAMIDOL (ISOVUE-370) INJECTION 76%
INTRAVENOUS | Status: AC
  Filled 2017-05-01: qty 100

## 2017-05-01 MED ORDER — ASPIRIN 81 MG PO CHEW
81.0000 mg | CHEWABLE_TABLET | ORAL | Status: AC
  Administered 2017-05-01: 81 mg via ORAL
  Filled 2017-05-01: qty 1

## 2017-05-01 MED ORDER — LIDOCAINE HCL (PF) 1 % IJ SOLN
INTRAMUSCULAR | Status: AC
  Filled 2017-05-01: qty 30

## 2017-05-01 MED ORDER — MIDAZOLAM HCL 2 MG/2ML IJ SOLN
INTRAMUSCULAR | Status: AC
  Filled 2017-05-01: qty 2

## 2017-05-01 MED ORDER — ACETAMINOPHEN 325 MG PO TABS: 650.0000 mg | ORAL_TABLET | ORAL | Status: DC | PRN

## 2017-05-01 MED ORDER — SODIUM CHLORIDE 0.9 % IV SOLN
INTRAVENOUS | Status: AC | PRN
  Administered 2017-05-01: 10 mL/h via INTRAVENOUS

## 2017-05-01 MED ORDER — HEPARIN (PORCINE) IN NACL 2-0.9 UNIT/ML-% IJ SOLN
INTRAMUSCULAR | Status: AC | PRN
  Administered 2017-05-01: 1000 mL

## 2017-05-01 MED ORDER — SODIUM CHLORIDE 0.9% FLUSH
3.0000 mL | Freq: Two times a day (BID) | INTRAVENOUS | Status: DC
  Administered 2017-05-01: 3 mL via INTRAVENOUS

## 2017-05-01 MED ORDER — IOPAMIDOL (ISOVUE-370) INJECTION 76%
INTRAVENOUS | Status: DC | PRN
  Administered 2017-05-01: 90 mL via INTRA_ARTERIAL

## 2017-05-01 MED ORDER — FENTANYL CITRATE (PF) 100 MCG/2ML IJ SOLN
INTRAMUSCULAR | Status: AC
  Filled 2017-05-01: qty 2

## 2017-05-01 MED ORDER — ASPIRIN 81 MG PO CHEW: 81.0000 mg | CHEWABLE_TABLET | Freq: Every day | ORAL | Status: DC

## 2017-05-01 MED ORDER — LIDOCAINE HCL (PF) 1 % IJ SOLN
INTRAMUSCULAR | Status: AC
Start: 2017-05-01 — End: 2017-05-01
  Filled 2017-05-01: qty 30

## 2017-05-01 MED ORDER — SODIUM CHLORIDE 0.9 % IV SOLN: 250.0000 mL | INTRAVENOUS | Status: DC | PRN

## 2017-05-01 MED ORDER — ISOSORBIDE DINITRATE 20 MG PO TABS
20.0000 mg | ORAL_TABLET | Freq: Three times a day (TID) | ORAL | Status: DC
  Administered 2017-05-01 – 2017-05-03 (×6): 20 mg via ORAL
  Filled 2017-05-01 (×8): qty 1

## 2017-05-01 MED ORDER — HEPARIN (PORCINE) IN NACL 2-0.9 UNIT/ML-% IJ SOLN
INTRAMUSCULAR | Status: AC
  Filled 2017-05-01: qty 1000

## 2017-05-01 MED ORDER — SODIUM CHLORIDE 0.9 % IV SOLN: INTRAVENOUS | Status: DC

## 2017-05-01 MED ORDER — LIDOCAINE HCL (PF) 1 % IJ SOLN
INTRAMUSCULAR | Status: DC | PRN
  Administered 2017-05-01: 12 mL

## 2017-05-01 MED ORDER — ATORVASTATIN CALCIUM 80 MG PO TABS
80.0000 mg | ORAL_TABLET | Freq: Every day | ORAL | Status: DC
  Administered 2017-05-02 – 2017-05-03 (×2): 80 mg via ORAL
  Filled 2017-05-01 (×2): qty 1

## 2017-05-01 MED ORDER — MIDAZOLAM HCL 2 MG/2ML IJ SOLN
INTRAMUSCULAR | Status: DC | PRN
  Administered 2017-05-01: 2 mg via INTRAVENOUS

## 2017-05-01 SURGICAL SUPPLY — 13 items
CATH INFINITI 5FR JL5 (CATHETERS) ×1 IMPLANT
CATH INFINITI 5FR MULTPACK ANG (CATHETERS) ×1 IMPLANT
CATH SWAN GANZ 7F STRAIGHT (CATHETERS) ×1 IMPLANT
GUIDEWIRE .025 260CM (WIRE) ×1 IMPLANT
KIT HEART LEFT (KITS) ×2 IMPLANT
PACK CARDIAC CATHETERIZATION (CUSTOM PROCEDURE TRAY) ×2 IMPLANT
SHEATH PINNACLE 5F 10CM (SHEATH) ×1 IMPLANT
SHEATH PINNACLE 7F 10CM (SHEATH) ×1 IMPLANT
TRANSDUCER W/STOPCOCK (MISCELLANEOUS) ×4 IMPLANT
TUBING ART PRESS 72  MALE/FEM (TUBING) ×1
TUBING ART PRESS 72 MALE/FEM (TUBING) IMPLANT
WIRE EMERALD 3MM-J .035X150CM (WIRE) ×3 IMPLANT
WIRE HI TORQ VERSACORE-J 145CM (WIRE) ×2 IMPLANT

## 2017-05-01 NOTE — Progress Notes (Signed)
Progress Note  Patient Name: Jeffrey [name] Date of Encounter: 05/01/2017  Primary Cardiologist: Lequita Halt / Marlou Porch  Subjective   No issues with dialysis other than some bleeding around iv heparin dose decreased  Inpatient Medications    Scheduled Meds: . aspirin EC  325 mg Oral Daily  . atorvastatin  40 mg Oral Daily  . cinacalcet  30 mg Oral Q supper  . famotidine  20 mg Oral Daily  . feeding supplement (NEPRO CARB STEADY)  237 mL Oral BID BM  . insulin aspart  0-20 Units Subcutaneous TID WC  . insulin aspart  0-5 Units Subcutaneous QHS  . insulin aspart  12 Units Subcutaneous TID WC  . insulin detemir  48 Units Subcutaneous QHS  . ipratropium-albuterol  3 mL Nebulization TID  . isosorbide dinitrate  10 mg Oral TID  . levofloxacin  500 mg Oral Q48H  . mouth rinse  15 mL Mouth Rinse BID  . metoprolol tartrate  25 mg Oral BID  . mometasone-formoterol  2 puff Inhalation BID  . multivitamin  1 tablet Oral QHS  . predniSONE  50 mg Oral Q breakfast  . sevelamer carbonate  1,600 mg Oral BID  . sevelamer carbonate  3,200 mg Oral TID WC  . sodium chloride flush  3 mL Intravenous Q12H  . sodium chloride flush  3 mL Intravenous Q12H   Continuous Infusions: . sodium chloride    . sodium chloride Stopped (04/30/17 1909)  . sodium chloride    . ferric gluconate (FERRLECIT/NULECIT) IV Stopped (04/29/17 1630)  . heparin 1,500 Units/hr (05/01/17 0433)   PRN Meds: sodium chloride, sodium chloride, acetaminophen **OR** acetaminophen, albuterol, calcium carbonate, morphine injection, ondansetron **OR** ondansetron (ZOFRAN) IV, sodium chloride flush, sodium chloride flush   Vital Signs    Vitals:   05/01/17 0845 05/01/17 0900 05/01/17 0915 05/01/17 0930  BP: (!) 103/39 (!) 96/49 (!) 111/44 (!) 115/44  Pulse: 66 68 69 61  Resp:      Temp:      TempSrc:      SpO2:      Weight:      Height:        Intake/Output Summary (Last 24 hours) at 05/01/17 0950 Last data filed at 05/01/17  0700  Gross per 24 hour  Intake          1148.92 ml  Output              250 ml  Net           898.92 ml   Filed Weights   04/30/17 0451 05/01/17 0500 05/01/17 0655  Weight: 227 lb 11.8 oz (103.3 kg) 229 lb 0.9 oz (103.9 kg) 229 lb 0.9 oz (103.9 kg)    Telemetry    V pacing - Personally Reviewed  ECG    Paced - Personally Reviewed  Physical Exam  Fistula LUE with bruit and thrill Pacer under left clavicle Obese white male  GEN: No acute distress.   Neck: No JVD Cardiac: RRR, 2/6 SEM, rubs, or gallops.  Respiratory: Clear to auscultation bilaterally. GI: Soft, nontender, non-distended  MS: 1+ LE edema; No deformity. Neuro:  Nonfocal  Psych: Normal affect   Labs    Chemistry  Recent Labs Lab 04/27/17 1357  04/28/17 1053 04/29/17 0401 04/30/17 0242 05/01/17 0248  NA  --   < > 132* 134* 131* 128*  K  --   < > 4.0 4.9 4.0 3.8  CL  --   < >  94* 92* 93* 90*  CO2  --   < > 26 29 27 24   GLUCOSE  --   < > 357* 267* 308* 160*  BUN  --   < > 36* 49* 30* 52*  CREATININE  --   < > 3.08* 3.98* 3.02* 4.46*  CALCIUM  --   < > 8.5* 8.2* 7.8* 7.6*  PROT 7.0  --   --   --   --   --   ALBUMIN 3.1*  --  2.9*  --   --   --   AST 20  --   --   --   --   --   ALT 12*  --   --   --   --   --   ALKPHOS 98  --   --   --   --   --   BILITOT 1.2  --   --   --   --   --   GFRNONAA  --   < > 18* 13* 18* 11*  GFRAA  --   < > 21* 15* 21* 13*  ANIONGAP  --   < > 12 13 11 14   < > = values in this interval not displayed.   Hematology  Recent Labs Lab 04/29/17 0401 04/30/17 0242 05/01/17 0248  WBC 14.7* 13.2* 14.7*  RBC 3.00* 3.11* 2.97*  HGB 9.0* 9.3* 9.0*  HCT 28.2* 29.3* 27.2*  MCV 94.0 94.2 91.6  MCH 30.0 29.9 30.3  MCHC 31.9 31.7 33.1  RDW 17.3* 17.5* 17.2*  PLT 281 259 241    Cardiac Enzymes  Recent Labs Lab 04/27/17 2230 04/28/17 0541 04/28/17 1053  TROPONINI 1.44* 1.39* 1.86*     Recent Labs Lab 04/27/17 1411  TROPIPOC 0.13*     BNP  Recent  Labs Lab 04/27/17 1352  BNP 1,193.6*     DDimer No results for input(s): DDIMER in the last 168 hours.   Radiology    No results found.  Cardiac Studies   Echo  04/28/17   Study Conclusions - Left ventricle: The cavity size was normal. Wall thickness was   increased in a pattern of moderate LVH. Systolic function was   mildly to moderately reduced. The estimated ejection fraction was   40%. Diffuse hypokinesis. Doppler parameters are consistent with   restrictive physiology, indicative of decreased left ventricular   diastolic compliance and/or increased left atrial pressure.   Doppler parameters are consistent with high ventricular filling   pressure. - Regional wall motion abnormality: Hypokinesis of the basal   inferoseptal myocardium; moderate hypokinesis of the apical   anterior, apical septal, apical lateral, and apical myocardium. 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 No significant bioprosthetic valvular   stenosis. There was no regurgitation. Peak velocity (S): 290   cm/s. Mean gradient (S): 15 mm Hg. Valve area (VTI): 2.16 cm^2.   Valve area (Vmax): 2.08 cm^2. Valve area (Vmean): 2.15 cm^2. - Mitral valve: Calcified annulus. Mildly thickened leaflets .   There was moderate regurgitation. There is some acoustic   shadowing from mitral annular calcification. - Left atrium: The atrium was severely dilated. - Right ventricle: Pacer wire or catheter noted in right ventricle. - Right atrium: The atrium was mildly dilated. - Tricuspid valve: There was mild regurgitation. - Pulmonic valve: There was mild regurgitation.  Impressions: - I compared today&'s study to the study dated 07/19/16. LVEF appears   slightly better in  some views and worse in others, but appears to   be in the 40% range. The apex, apical septum, anteroapical wall,   and anterolateral walls may be slightly more hypokinetic than   before.   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  Assessment & Plan    CAD:   - known total RCA with collaterals elevated troponin and recurrent dyspnea and chest tightness With minimal activity for cath latter today . Nitrates started   S/p TAVR:   -SEM on exam, echo shows no perivalvular leak, gradient 15 mmHg  CRF   -Dialysis per nephrology, due again 6/8 after this am norvasc DC to support BP  -Fistula LUE ok   Jenkins Rouge

## 2017-05-01 NOTE — H&P (View-Only) (Signed)
Progress Note  Patient Name: Jeffrey [name] Date of Encounter: 05/01/2017  Primary Cardiologist: Lequita Halt / Marlou Porch  Subjective   No issues with dialysis other than some bleeding around iv heparin dose decreased  Inpatient Medications    Scheduled Meds: . aspirin EC  325 mg Oral Daily  . atorvastatin  40 mg Oral Daily  . cinacalcet  30 mg Oral Q supper  . famotidine  20 mg Oral Daily  . feeding supplement (NEPRO CARB STEADY)  237 mL Oral BID BM  . insulin aspart  0-20 Units Subcutaneous TID WC  . insulin aspart  0-5 Units Subcutaneous QHS  . insulin aspart  12 Units Subcutaneous TID WC  . insulin detemir  48 Units Subcutaneous QHS  . ipratropium-albuterol  3 mL Nebulization TID  . isosorbide dinitrate  10 mg Oral TID  . levofloxacin  500 mg Oral Q48H  . mouth rinse  15 mL Mouth Rinse BID  . metoprolol tartrate  25 mg Oral BID  . mometasone-formoterol  2 puff Inhalation BID  . multivitamin  1 tablet Oral QHS  . predniSONE  50 mg Oral Q breakfast  . sevelamer carbonate  1,600 mg Oral BID  . sevelamer carbonate  3,200 mg Oral TID WC  . sodium chloride flush  3 mL Intravenous Q12H  . sodium chloride flush  3 mL Intravenous Q12H   Continuous Infusions: . sodium chloride    . sodium chloride Stopped (04/30/17 1909)  . sodium chloride    . ferric gluconate (FERRLECIT/NULECIT) IV Stopped (04/29/17 1630)  . heparin 1,500 Units/hr (05/01/17 0433)   PRN Meds: sodium chloride, sodium chloride, acetaminophen **OR** acetaminophen, albuterol, calcium carbonate, morphine injection, ondansetron **OR** ondansetron (ZOFRAN) IV, sodium chloride flush, sodium chloride flush   Vital Signs    Vitals:   05/01/17 0845 05/01/17 0900 05/01/17 0915 05/01/17 0930  BP: (!) 103/39 (!) 96/49 (!) 111/44 (!) 115/44  Pulse: 66 68 69 61  Resp:      Temp:      TempSrc:      SpO2:      Weight:      Height:        Intake/Output Summary (Last 24 hours) at 05/01/17 0950 Last data filed at 05/01/17  0700  Gross per 24 hour  Intake          1148.92 ml  Output              250 ml  Net           898.92 ml   Filed Weights   04/30/17 0451 05/01/17 0500 05/01/17 0655  Weight: 227 lb 11.8 oz (103.3 kg) 229 lb 0.9 oz (103.9 kg) 229 lb 0.9 oz (103.9 kg)    Telemetry    V pacing - Personally Reviewed  ECG    Paced - Personally Reviewed  Physical Exam  Fistula LUE with bruit and thrill Pacer under left clavicle Obese white male  GEN: No acute distress.   Neck: No JVD Cardiac: RRR, 2/6 SEM, rubs, or gallops.  Respiratory: Clear to auscultation bilaterally. GI: Soft, nontender, non-distended  MS: 1+ LE edema; No deformity. Neuro:  Nonfocal  Psych: Normal affect   Labs    Chemistry  Recent Labs Lab 04/27/17 1357  04/28/17 1053 04/29/17 0401 04/30/17 0242 05/01/17 0248  NA  --   < > 132* 134* 131* 128*  K  --   < > 4.0 4.9 4.0 3.8  CL  --   < >  94* 92* 93* 90*  CO2  --   < > 26 29 27 24   GLUCOSE  --   < > 357* 267* 308* 160*  BUN  --   < > 36* 49* 30* 52*  CREATININE  --   < > 3.08* 3.98* 3.02* 4.46*  CALCIUM  --   < > 8.5* 8.2* 7.8* 7.6*  PROT 7.0  --   --   --   --   --   ALBUMIN 3.1*  --  2.9*  --   --   --   AST 20  --   --   --   --   --   ALT 12*  --   --   --   --   --   ALKPHOS 98  --   --   --   --   --   BILITOT 1.2  --   --   --   --   --   GFRNONAA  --   < > 18* 13* 18* 11*  GFRAA  --   < > 21* 15* 21* 13*  ANIONGAP  --   < > 12 13 11 14   < > = values in this interval not displayed.   Hematology  Recent Labs Lab 04/29/17 0401 04/30/17 0242 05/01/17 0248  WBC 14.7* 13.2* 14.7*  RBC 3.00* 3.11* 2.97*  HGB 9.0* 9.3* 9.0*  HCT 28.2* 29.3* 27.2*  MCV 94.0 94.2 91.6  MCH 30.0 29.9 30.3  MCHC 31.9 31.7 33.1  RDW 17.3* 17.5* 17.2*  PLT 281 259 241    Cardiac Enzymes  Recent Labs Lab 04/27/17 2230 04/28/17 0541 04/28/17 1053  TROPONINI 1.44* 1.39* 1.86*     Recent Labs Lab 04/27/17 1411  TROPIPOC 0.13*     BNP  Recent  Labs Lab 04/27/17 1352  BNP 1,193.6*     DDimer No results for input(s): DDIMER in the last 168 hours.   Radiology    No results found.  Cardiac Studies   Echo  04/28/17   Study Conclusions - Left ventricle: The cavity size was normal. Wall thickness was   increased in a pattern of moderate LVH. Systolic function was   mildly to moderately reduced. The estimated ejection fraction was   40%. Diffuse hypokinesis. Doppler parameters are consistent with   restrictive physiology, indicative of decreased left ventricular   diastolic compliance and/or increased left atrial pressure.   Doppler parameters are consistent with high ventricular filling   pressure. - Regional wall motion abnormality: Hypokinesis of the basal   inferoseptal myocardium; moderate hypokinesis of the apical   anterior, apical septal, apical lateral, and apical myocardium. 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 No significant bioprosthetic valvular   stenosis. There was no regurgitation. Peak velocity (S): 290   cm/s. Mean gradient (S): 15 mm Hg. Valve area (VTI): 2.16 cm^2.   Valve area (Vmax): 2.08 cm^2. Valve area (Vmean): 2.15 cm^2. - Mitral valve: Calcified annulus. Mildly thickened leaflets .   There was moderate regurgitation. There is some acoustic   shadowing from mitral annular calcification. - Left atrium: The atrium was severely dilated. - Right ventricle: Pacer wire or catheter noted in right ventricle. - Right atrium: The atrium was mildly dilated. - Tricuspid valve: There was mild regurgitation. - Pulmonic valve: There was mild regurgitation.  Impressions: - I compared today&'s study to the study dated 07/19/16. LVEF appears   slightly better in  some views and worse in others, but appears to   be in the 40% range. The apex, apical septum, anteroapical wall,   and anterolateral walls may be slightly more hypokinetic than   before.   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  Assessment & Plan    CAD:   - known total RCA with collaterals elevated troponin and recurrent dyspnea and chest tightness With minimal activity for cath latter today . Nitrates started   S/p TAVR:   -SEM on exam, echo shows no perivalvular leak, gradient 15 mmHg  CRF   -Dialysis per nephrology, due again 6/8 after this am norvasc DC to support BP  -Fistula LUE ok   Jenkins Rouge

## 2017-05-01 NOTE — Progress Notes (Signed)
Site area: rt groin fa and fv sheaths Site Prior to Removal:  Level 0 Pressure Applied For: 20 minutes Manual:   yes Patient Status During Pull:  stable Post Pull Site:  Level 0 Post Pull Instructions Given:  yes Post Pull Pulses Present: yes Dressing Applied:  Gauze and tegaderm Bedrest begins @ 1450 Comments:

## 2017-05-01 NOTE — Procedures (Signed)
I was present at this dialysis session. I have reviewed the session itself and made appropriate changes.   Tolerating well. Goal UF 3L, will be under outpatient EDW.  4K bath.    For LHC + RHC later today. Stopped amlodipine on ISMN.  Next HD tentative 6/8 inpatient or outpatient.   Filed Weights   04/30/17 0451 05/01/17 0500 05/01/17 0655  Weight: 103.3 kg (227 lb 11.8 oz) 103.9 kg (229 lb 0.9 oz) 103.9 kg (229 lb 0.9 oz)     Recent Labs Lab 04/28/17 1053  05/01/17 0248  NA 132*  < > 128*  K 4.0  < > 3.8  CL 94*  < > 90*  CO2 26  < > 24  GLUCOSE 357*  < > 160*  BUN 36*  < > 52*  CREATININE 3.08*  < > 4.46*  CALCIUM 8.5*  < > 7.6*  PHOS 3.6  --   --   < > = values in this interval not displayed.   Recent Labs Lab 04/29/17 0401 04/30/17 0242 05/01/17 0248  WBC 14.7* 13.2* 14.7*  HGB 9.0* 9.3* 9.0*  HCT 28.2* 29.3* 27.2*  MCV 94.0 94.2 91.6  PLT 281 259 241    Scheduled Meds: . aspirin EC  325 mg Oral Daily  . atorvastatin  40 mg Oral Daily  . cinacalcet  30 mg Oral Q supper  . famotidine  20 mg Oral Daily  . feeding supplement (NEPRO CARB STEADY)  237 mL Oral BID BM  . insulin aspart  0-20 Units Subcutaneous TID WC  . insulin aspart  0-5 Units Subcutaneous QHS  . insulin aspart  12 Units Subcutaneous TID WC  . insulin detemir  48 Units Subcutaneous QHS  . ipratropium-albuterol  3 mL Nebulization TID  . isosorbide dinitrate  10 mg Oral TID  . levofloxacin  500 mg Oral Q48H  . mouth rinse  15 mL Mouth Rinse BID  . metoprolol tartrate  25 mg Oral BID  . mometasone-formoterol  2 puff Inhalation BID  . multivitamin  1 tablet Oral QHS  . predniSONE  50 mg Oral Q breakfast  . sevelamer carbonate  1,600 mg Oral BID  . sevelamer carbonate  3,200 mg Oral TID WC  . sodium chloride flush  3 mL Intravenous Q12H  . sodium chloride flush  3 mL Intravenous Q12H   Continuous Infusions: . sodium chloride    . sodium chloride Stopped (04/30/17 1909)  . [START ON  05/02/2017] sodium chloride    . ferric gluconate (FERRLECIT/NULECIT) IV Stopped (04/29/17 1630)  . heparin 1,500 Units/hr (05/01/17 0433)   PRN Meds:.sodium chloride, sodium chloride, acetaminophen **OR** acetaminophen, albuterol, calcium carbonate, morphine injection, ondansetron **OR** ondansetron (ZOFRAN) IV, sodium chloride flush, sodium chloride flush   Pearson Grippe  MD 05/01/2017, 7:50 AM

## 2017-05-01 NOTE — Interval H&P Note (Signed)
Cath Lab Visit (complete for each Cath Lab visit)  Clinical Evaluation Leading to the Procedure:   ACS: No.  Non-ACS:    Anginal Classification: CCS III  Anti-ischemic medical therapy: Maximal Therapy (2 or more classes of medications)  Non-Invasive Test Results: No non-invasive testing performed  Prior CABG: Previous CABG      History and Physical Interval Note:  05/01/2017 1:01 PM  Jeffrey [name]  has presented today for surgery, with the diagnosis of elevated troponins, sob  The various methods of treatment have been discussed with the patient and family. After consideration of risks, benefits and other options for treatment, the patient has consented to  Procedure(s): Right/Left Heart Cath and Coronary Angiography (N/A) as a surgical intervention .  The patient's history has been reviewed, patient examined, no change in status, stable for surgery.  I have reviewed the patient's chart and labs.  Questions were answered to the patient's satisfaction.     Shelva Majestic

## 2017-05-01 NOTE — Progress Notes (Signed)
ANTICOAGULATION CONSULT NOTE - Follow Up Consult  Pharmacy Consult for heparin Indication: chest pain/ACS  Labs:  Recent Labs  04/28/17 0541 04/28/17 1053  04/29/17 0401 04/30/17 0242 04/30/17 1128 04/30/17 1813 05/01/17 0248  HGB 8.7*  --   --  9.0* 9.3*  --   --  9.0*  HCT 27.5*  --   --  28.2* 29.3*  --   --  27.2*  PLT 313  --   --  281 259  --   --  241  LABPROT  --   --   --   --   --   --   --  16.0*  INR  --   --   --   --   --   --   --  1.27  HEPARINUNFRC  --  <0.10*  < > 0.37 0.17* 0.32 0.53 0.73*  CREATININE 2.80* 3.08*  --  3.98* 3.02*  --   --   --   TROPONINI 1.39* 1.86*  --   --   --   --   --   --   < > = values in this interval not displayed.   Assessment: 76yo male now above goal on heparin after two levels at goal though had been trending up; RN notes pt had some bleeding from IV site yesterday during day shift and after HD, eventually controlled, Hgb fairly stable.  Goal of Therapy:  Heparin level 0.3-0.7 units/ml   Plan:  Will decrease heparin gtt slightly to 1500 units/hr and check level in [place], PharmD, BCPS  05/01/2017,4:33 AM

## 2017-05-01 NOTE — Progress Notes (Addendum)
rebleed noted leaving cath holding. Pressure reapplied by Eddie Dibbles.  Site remains level zero with palpable pulses. New bed rest time is 1515 till 1915.

## 2017-05-01 NOTE — Progress Notes (Signed)
[name] - Stepdown/ICU TEAM  Jeffrey [name]  GMW:[id number] DOB: 03-Jan-1941 DOA: 04/27/2017 PCP: Jeffrey Coyer, MD    Brief Narrative:  76 y.o.M Hx ESRD on HD M/W/F, atrial fibrillation S/P pacemaker, CAD with chronic RCA occlusion, severe aortic stenosis S/P Bioprosthetic TAVR, chronic systolic and diastolic CHF (EF 44-03% w/ grade 1 diastolic dysfunction on echo 07/19/16), COPD, and DM2 who presented with chest pain which radiated into his L shoulder associated w/ SOB.    Subjective: Pt has been in HD and the cath lab each time I have attempted to visit him today.  His active med problems have been addressed today by Cardiology and Nephrology.    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   DVT prophylaxis: SQ heparin  Code Status: FULL CODE Family Communication:  Disposition Plan: SDU  Consultants:  Cardiology Nephrology   Procedures: none  Antimicrobials:  Levaquin 6/3 >  Objective: Blood pressure (!) 118/34, pulse 61, temperature 97.6 F (36.4 C), temperature source Oral, resp. rate 10, height 5\' 11"  (1.803 m), weight 101 kg (222 lb 10.6 oz), SpO2 94 %.  Intake/Output Summary (Last 24 hours) at 05/01/17 1716 Last data filed at 05/01/17 1104  Gross per 24 hour  Intake           661.55 ml  Output             2970 ml  Net         -2308.45  ml   Filed Weights   05/01/17 0500 05/01/17 0655 05/01/17 1104  Weight: 103.9 kg (229 lb 0.9 oz) 103.9 kg (229 lb 0.9 oz) 101 kg (222 lb 10.6 oz)    Examination: No exam today = no charge  CBC:  Recent Labs Lab 04/28/17 0541 04/29/17 0401 04/30/17 0242 05/01/17 0248 05/01/17 1556  WBC 16.1* 14.7* 13.2* 14.7* 13.1*  HGB 8.7* 9.0* 9.3* 9.0* 9.3*  HCT 27.5* 28.2* 29.3* 27.2* 29.5*  MCV 94.5 94.0 94.2 91.6 94.6  PLT 313 281 259 241 474   Basic Metabolic Panel:  Recent Labs Lab 04/28/17 0541 04/28/17 1053 04/29/17 0401 04/30/17 0242 05/01/17 0248 05/01/17 1556  NA 132* 132* 134* 131* 128*  --   K 4.1 4.0 4.9 4.0 3.8  --   CL 92* 94* 92* 93* 90*  --   CO2 27 26 29 27 24   --   GLUCOSE 457* 357* 267* 308* 160*  --   BUN 31* 36* 49* 30* 52*  --   CREATININE 2.80* 3.08* 3.98* 3.02* 4.46* 2.46*  CALCIUM 8.1* 8.5* 8.2* 7.8* 7.6*  --   MG  --   --  2.0  --   --   --   PHOS  --  3.6  --   --   --   --    GFR: Estimated Creatinine Clearance: 30 mL/min (A) (by  C-G formula based on SCr of 2.46 mg/dL (H)).  Liver Function Tests:  Recent Labs Lab 04/27/17 1357 04/28/17 1053  AST 20  --   ALT 12*  --   ALKPHOS 98  --   BILITOT 1.2  --   PROT 7.0  --   ALBUMIN 3.1* 2.9*    Cardiac Enzymes:  Recent Labs Lab 04/27/17 2230 04/28/17 0541 04/28/17 1053  TROPONINI 1.44* 1.39* 1.86*    HbA1C: Hgb A1c MFr Bld  Date/Time Value Ref Range Status  04/29/2017 04:01 AM 7.0 (H) 4.8 - 5.6 % Final    Comment:    (NOTE)         Pre-diabetes: 5.7 - 6.4         Diabetes: >6.4         Glycemic control for adults with diabetes: <7.0   08/01/2015 12:50 PM 5.6 4.8 - 5.6 % Final    Comment:    (NOTE)         Pre-diabetes: 5.7 - 6.4         Diabetes: >6.4         Glycemic control for adults with diabetes: <7.0     CBG:  Recent Labs Lab 04/30/17 2100 05/01/17 1031 05/01/17 1208 05/01/17 1428 05/01/17 1634  GLUCAP 189* 112* 100* 106* 195*    Recent Results (from  the past 240 hour(s))  MRSA PCR Screening     Status: None   Collection Time: 04/28/17  9:58 AM  Result Value Ref Range Status   MRSA by PCR NEGATIVE NEGATIVE Final    Comment:        The GeneXpert MRSA Assay (FDA approved for NASAL specimens only), is one component of a comprehensive MRSA colonization surveillance program. It is not intended to diagnose MRSA infection nor to guide or monitor treatment for MRSA infections.   Respiratory Panel by PCR     Status: None   Collection Time: 04/28/17  4:07 PM  Result Value Ref Range Status   Adenovirus NOT DETECTED NOT DETECTED Final   Coronavirus 229E NOT DETECTED NOT DETECTED Final   Coronavirus HKU1 NOT DETECTED NOT DETECTED Final   Coronavirus NL63 NOT DETECTED NOT DETECTED Final   Coronavirus OC43 NOT DETECTED NOT DETECTED Final   Metapneumovirus NOT DETECTED NOT DETECTED Final   Rhinovirus / Enterovirus NOT DETECTED NOT DETECTED Final   Influenza A NOT DETECTED NOT DETECTED Final   Influenza B NOT DETECTED NOT DETECTED Final   Parainfluenza Virus 1 NOT DETECTED NOT DETECTED Final   Parainfluenza Virus 2 NOT DETECTED NOT DETECTED Final   Parainfluenza Virus 3 NOT DETECTED NOT DETECTED Final   Parainfluenza Virus 4 NOT DETECTED NOT DETECTED Final   Respiratory Syncytial Virus NOT DETECTED NOT DETECTED Final   Bordetella pertussis NOT DETECTED NOT DETECTED Final   Chlamydophila pneumoniae NOT DETECTED NOT DETECTED Final   Mycoplasma pneumoniae NOT DETECTED NOT DETECTED Final     Scheduled Meds: . [START ON 05/02/2017] aspirin EC  81 mg Oral Daily  . [START ON 05/02/2017] atorvastatin  80 mg Oral Daily  . cinacalcet  30 mg Oral Q supper  . famotidine  20 mg Oral Daily  . feeding supplement (NEPRO CARB STEADY)  237 mL Oral BID BM  . heparin  5,000 Units Subcutaneous Q8H  . insulin aspart  0-20 Units Subcutaneous TID WC  . insulin aspart  0-5 Units Subcutaneous QHS  . insulin aspart  12 Units Subcutaneous TID WC  . insulin  detemir  48 Units Subcutaneous QHS  . ipratropium-albuterol  3 mL Nebulization TID  . isosorbide dinitrate  20 mg Oral TID  . levofloxacin  500 mg Oral Q48H  . mouth rinse  15 mL Mouth Rinse BID  . metoprolol tartrate  25 mg Oral BID  . mometasone-formoterol  2 puff Inhalation BID  . multivitamin  1 tablet Oral QHS  . predniSONE  50 mg Oral Q breakfast  . sevelamer carbonate  1,600 mg Oral BID  . sevelamer carbonate  3,200 mg Oral TID WC  . sodium chloride flush  3 mL Intravenous Q12H  . sodium chloride flush  3 mL Intravenous Q12H     LOS: 4 days   Cherene Altes, MD [facility] Office  [phone] Pager - Text Page per Shea Evans as per below:  On-Call/Text Page:      [email or web address]      password [email or web address]  If 7PM-7AM, please contact night-coverage [email or web address] Password [email or web address] 05/01/2017, 5:16 PM

## 2017-05-02 ENCOUNTER — Other Ambulatory Visit: Payer: Self-pay

## 2017-05-02 ENCOUNTER — Ambulatory Visit (INDEPENDENT_AMBULATORY_CARE_PROVIDER_SITE_OTHER): Payer: Medicare Other | Admitting: *Deleted

## 2017-05-02 ENCOUNTER — Encounter (HOSPITAL_COMMUNITY): Payer: Self-pay | Admitting: Cardiology

## 2017-05-02 DIAGNOSIS — I25119 Atherosclerotic heart disease of native coronary artery with unspecified angina pectoris: Secondary | ICD-10-CM

## 2017-05-02 DIAGNOSIS — I442 Atrioventricular block, complete: Secondary | ICD-10-CM | POA: Diagnosis not present

## 2017-05-02 DIAGNOSIS — I2510 Atherosclerotic heart disease of native coronary artery without angina pectoris: Secondary | ICD-10-CM | POA: Diagnosis present

## 2017-05-02 LAB — BASIC METABOLIC PANEL
Anion gap: 12 (ref 5–15)
BUN: 34 mg/dL — ABNORMAL HIGH (ref 6–20)
CHLORIDE: 95 mmol/L — AB (ref 101–111)
CO2: 25 mmol/L (ref 22–32)
CREATININE: 3.53 mg/dL — AB (ref 0.61–1.24)
Calcium: 7.4 mg/dL — ABNORMAL LOW (ref 8.9–10.3)
GFR calc non Af Amer: 15 mL/min — ABNORMAL LOW (ref >60)
GFR, EST AFRICAN AMERICAN: 18 mL/min — AB (ref >60)
Glucose, Bld: 280 mg/dL — ABNORMAL HIGH (ref 65–99)
POTASSIUM: 4.6 mmol/L (ref 3.5–5.1)
Sodium: 132 mmol/L — ABNORMAL LOW (ref 135–145)

## 2017-05-02 LAB — CBC
HEMATOCRIT: 26.7 % — AB (ref 39.0–52.0)
Hemoglobin: 8.6 g/dL — ABNORMAL LOW (ref 13.0–17.0)
MCH: 30.7 pg (ref 26.0–34.0)
MCHC: 32.2 g/dL (ref 30.0–36.0)
MCV: 95.4 fL (ref 78.0–100.0)
PLATELETS: 197 10*3/uL (ref 150–400)
RBC: 2.8 MIL/uL — AB (ref 4.22–5.81)
RDW: 18.1 % — ABNORMAL HIGH (ref 11.5–15.5)
WBC: 9.2 10*3/uL (ref 4.0–10.5)

## 2017-05-02 LAB — GLUCOSE, CAPILLARY
GLUCOSE-CAPILLARY: 146 mg/dL — AB (ref 65–99)
GLUCOSE-CAPILLARY: 146 mg/dL — AB (ref 65–99)
Glucose-Capillary: 207 mg/dL — ABNORMAL HIGH (ref 65–99)
Glucose-Capillary: 195 mg/dL — ABNORMAL HIGH (ref 65–99)

## 2017-05-02 MED ORDER — ALBUTEROL SULFATE (2.5 MG/3ML) 0.083% IN NEBU: 2.5000 mg | INHALATION_SOLUTION | RESPIRATORY_TRACT | Status: DC | PRN

## 2017-05-02 MED FILL — Lidocaine HCl Local Preservative Free (PF) Inj 1%: INTRAMUSCULAR | Qty: 30 | Status: AC

## 2017-05-02 NOTE — Progress Notes (Signed)
Admit: 04/27/2017 LOS: 5  59M ESRD [place] with NSTEMI, hypervolemia  Subjective:  LHC/RHC yesterday; no PCI; he has some frustration that no PCI performed HD Yesterday 2.7L UF, post weight 101kg  Hasn't attempted ambulation, no reported dyspnea or angina   06/06 0701 - 06/07 0700 In: 470 [P.O.:360; IV Piggyback:110] Out: 2720   Filed Weights   05/01/17 0500 05/01/17 0655 05/01/17 1104  Weight: 103.9 kg (229 lb 0.9 oz) 103.9 kg (229 lb 0.9 oz) 101 kg (222 lb 10.6 oz)    Scheduled Meds: . aspirin EC  81 mg Oral Daily  . atorvastatin  80 mg Oral Daily  . cinacalcet  30 mg Oral Q supper  . famotidine  20 mg Oral Daily  . feeding supplement (NEPRO CARB STEADY)  237 mL Oral BID BM  . heparin  5,000 Units Subcutaneous Q8H  . insulin aspart  0-20 Units Subcutaneous TID WC  . insulin aspart  0-5 Units Subcutaneous QHS  . insulin aspart  12 Units Subcutaneous TID WC  . insulin detemir  48 Units Subcutaneous QHS  . ipratropium-albuterol  3 mL Nebulization TID  . isosorbide dinitrate  20 mg Oral TID  . levofloxacin  500 mg Oral Q48H  . mouth rinse  15 mL Mouth Rinse BID  . metoprolol tartrate  25 mg Oral BID  . mometasone-formoterol  2 puff Inhalation BID  . multivitamin  1 tablet Oral QHS  . predniSONE  50 mg Oral Q breakfast  . sevelamer carbonate  1,600 mg Oral BID  . sevelamer carbonate  3,200 mg Oral TID WC  . sodium chloride flush  3 mL Intravenous Q12H   Continuous Infusions: . sodium chloride    . ferric gluconate (FERRLECIT/NULECIT) IV Stopped (05/01/17 1106)   PRN Meds:.sodium chloride, acetaminophen, albuterol, calcium carbonate, morphine injection, ondansetron (ZOFRAN) IV, sodium chloride flush  Current Labs: reviewed    Physical Exam:  Blood pressure 126/61, pulse 68, temperature 97.5 F (36.4 C), temperature source Oral, resp. rate 15, height 5\' 11"  (1.803 m), weight 101 kg (222 lb 10.6 oz), SpO2 96 %. NAD, obese Good aeration in bases, nl wob, no  crackles RRR Edema improved LUE AV fistula with bruit and thrill Nonfocal with intact cranial nerves II through XII  HD orders: MWF [place] 4 hrs 400/800 160 NRe  2.0 K/2.5 Ca 103 kg -No heparin -Mircera 100 mcg IV Q 2 weeks (last dose 04/24/17 HGB 9.0 04/24/17) -Venofer 100 mg IV X 5 (2/5 doses given Fe 45 Tsat 27% 0523/18) -Hectoral 2 mcg IV TIW (Last PTH 194 04/17/17)  BMD meds: -Sensipar 30 mg PO daily -Renvela 800 mg 4 tabs PO TID AC 2 tabs PO BID w/snack (last Phos 4.2 Ca  8.1 C Ca 8.8 04/17/17)  A 1. ESRD MWF [place] 2. SOB / Pulm Edema / Hypoxia 3. NSTEMI, being medically managed s/p LHC/RHC -- MTP increased now on ISDN 4. Progressive Debility over past several weeks 5. ANemia, next ESA due 6/13 6. 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 HTN on MTP, amlodipine stopped 8. S/p TAVR 9. Hx/o CHB, s/p PPM 10. Leukocytosis, resolved  P 1. HD on schedule MWF cont to probe for lower EDW; 2K, no heparin 2. Need standing post weights 3. Pt to discuss recent LHC with rounding cardiology   Pearson Grippe MD 05/02/2017, 10:24 AM   Recent Labs Lab 04/28/17 1053  04/30/17 0242 05/01/17 0248 05/01/17 1556 05/02/17 0237  NA 132*  < > 131* 128*  --  132*  K 4.0  < > 4.0 3.8  --  4.6  CL 94*  < > 93* 90*  --  95*  CO2 26  < > 27 24  --  25  GLUCOSE 357*  < > 308* 160*  --  280*  BUN 36*  < > 30* 52*  --  34*  CREATININE 3.08*  < > 3.02* 4.46* 2.46* 3.53*  CALCIUM 8.5*  < > 7.8* 7.6*  --  7.4*  PHOS 3.6  --   --   --   --   --   < > = values in this interval not displayed.  Recent Labs Lab 05/01/17 0248 05/01/17 1556 05/02/17 0237  WBC 14.7* 13.1* 9.2  HGB 9.0* 9.3* 8.6*  HCT 27.2* 29.5* 26.7*  MCV 91.6 94.6 95.4  PLT 241 209 197

## 2017-05-02 NOTE — Discharge Instructions (Signed)
PLEASE REMEMBER TO BRING ALL OF YOUR MEDICATIONS TO EACH OF YOUR FOLLOW-UP OFFICE VISITS.  PLEASE ATTEND ALL SCHEDULED FOLLOW-UP APPOINTMENTS.   Activity: Increase activity slowly as tolerated. You may shower, but no soaking baths (or swimming) for 1 week. No driving for 1 week. No lifting over 10 lbs for 2 weeks. No sexual activity for 2 weeks.   You May Return to Work: in 1 week (if applicable)  Wound Care: You may wash cath site gently with soap and water. Keep cath site clean and dry. If you notice pain, swelling, bleeding or pus at your cath site, please call [phone].

## 2017-05-02 NOTE — Care Management Important Message (Signed)
Important Message  Patient Details  Name: Jeffrey [name] MRN: [id number] Date of Birth: 01/14/1941   Medicare Important Message Given:  Yes    Nathen May 05/02/2017, 12:09 PM

## 2017-05-02 NOTE — Progress Notes (Signed)
Progress Note  Patient Name: Jeffrey [name] Date of Encounter: 05/02/2017  Primary Cardiologist: Lequita Halt / Marlou Porch  Subjective   Pt initially very upset that he did not get an intervention yesterday with cath and did not understand medical therapy. I had a long discussion with pt about the plan for medical therapy and purpose of each of the medications. He admits that his breathing is much better and is not longer having chest pain. He says that he feel much better about the situation now.   Inpatient Medications    Scheduled Meds: . aspirin EC  81 mg Oral Daily  . atorvastatin  80 mg Oral Daily  . cinacalcet  30 mg Oral Q supper  . famotidine  20 mg Oral Daily  . feeding supplement (NEPRO CARB STEADY)  237 mL Oral BID BM  . heparin  5,000 Units Subcutaneous Q8H  . insulin aspart  0-20 Units Subcutaneous TID WC  . insulin aspart  0-5 Units Subcutaneous QHS  . insulin aspart  12 Units Subcutaneous TID WC  . insulin detemir  48 Units Subcutaneous QHS  . ipratropium-albuterol  3 mL Nebulization TID  . isosorbide dinitrate  20 mg Oral TID  . levofloxacin  500 mg Oral Q48H  . mouth rinse  15 mL Mouth Rinse BID  . metoprolol tartrate  25 mg Oral BID  . mometasone-formoterol  2 puff Inhalation BID  . multivitamin  1 tablet Oral QHS  . predniSONE  50 mg Oral Q breakfast  . sevelamer carbonate  1,600 mg Oral BID  . sevelamer carbonate  3,200 mg Oral TID WC  . sodium chloride flush  3 mL Intravenous Q12H   Continuous Infusions: . sodium chloride    . ferric gluconate (FERRLECIT/NULECIT) IV Stopped (05/01/17 1106)   PRN Meds: sodium chloride, acetaminophen, albuterol, calcium carbonate, morphine injection, ondansetron (ZOFRAN) IV, sodium chloride flush   Vital Signs    Vitals:   05/01/17 2350 05/02/17 0408 05/02/17 0800 05/02/17 0900  BP: 111/86 (!) 135/58 126/61   Pulse: 69 63 68   Resp: 18 13 15    Temp:  97.9 F (36.6 C) 97.5 F (36.4 C)   TempSrc:  Oral Oral   SpO2: 95%  93% 100% 96%  Weight:      Height:        Intake/Output Summary (Last 24 hours) at 05/02/17 1136 Last data filed at 05/02/17 0900  Gross per 24 hour  Intake              950 ml  Output                0 ml  Net              950 ml   Filed Weights   05/01/17 0500 05/01/17 0655 05/01/17 1104  Weight: 229 lb 0.9 oz (103.9 kg) 229 lb 0.9 oz (103.9 kg) 222 lb 10.6 oz (101 kg)    Telemetry    V pacing - Personally Reviewed  ECG    Paced - Personally Reviewed  Physical Exam  Fistula LUE with bruit and thrill Pacer under left clavicle Obese white male  GEN: No acute distress.   Neck: No JVD Cardiac: RRR, 2/6 SEM, No rubs, or gallops.  Respiratory: Clear to auscultation bilaterally except for very faint exp wheezes on the left.  GI: Soft, nontender, non-distended  MS: No edema; No deformity. Neuro:  Nonfocal  Psych: Normal affect   Right groin cath site  bruised but soft and without tenderness or hematoma.   Labs    Chemistry  Recent Labs Lab 04/27/17 1357  04/28/17 1053  04/30/17 0242 05/01/17 0248 05/01/17 1556 05/02/17 0237  NA  --   < > 132*  < > 131* 128*  --  132*  K  --   < > 4.0  < > 4.0 3.8  --  4.6  CL  --   < > 94*  < > 93* 90*  --  95*  CO2  --   < > 26  < > 27 24  --  25  GLUCOSE  --   < > 357*  < > 308* 160*  --  280*  BUN  --   < > 36*  < > 30* 52*  --  34*  CREATININE  --   < > 3.08*  < > 3.02* 4.46* 2.46* 3.53*  CALCIUM  --   < > 8.5*  < > 7.8* 7.6*  --  7.4*  PROT 7.0  --   --   --   --   --   --   --   ALBUMIN 3.1*  --  2.9*  --   --   --   --   --   AST 20  --   --   --   --   --   --   --   ALT 12*  --   --   --   --   --   --   --   ALKPHOS 98  --   --   --   --   --   --   --   BILITOT 1.2  --   --   --   --   --   --   --   GFRNONAA  --   < > 18*  < > 18* 11* 24* 15*  GFRAA  --   < > 21*  < > 21* 13* 27* 18*  ANIONGAP  --   < > 12  < > 11 14  --  12  < > = values in this interval not displayed.   Hematology  Recent Labs Lab  05/01/17 0248 05/01/17 1556 05/02/17 0237  WBC 14.7* 13.1* 9.2  RBC 2.97* 3.12* 2.80*  HGB 9.0* 9.3* 8.6*  HCT 27.2* 29.5* 26.7*  MCV 91.6 94.6 95.4  MCH 30.3 29.8 30.7  MCHC 33.1 31.5 32.2  RDW 17.2* 18.1* 18.1*  PLT 241 209 197    Cardiac Enzymes  Recent Labs Lab 04/27/17 2230 04/28/17 0541 04/28/17 1053  TROPONINI 1.44* 1.39* 1.86*     Recent Labs Lab 04/27/17 1411  TROPIPOC 0.13*     BNP  Recent Labs Lab 04/27/17 1352  BNP 1,193.6*     DDimer No results for input(s): DDIMER in the last 168 hours.   Radiology    No results found.  Cardiac Studies   Echo  04/28/17   Study Conclusions - Left ventricle: The cavity size was normal. Wall thickness was   increased in a pattern of moderate LVH. Systolic function was   mildly to moderately reduced. The estimated ejection fraction was   40%. Diffuse hypokinesis. Doppler parameters are consistent with   restrictive physiology, indicative of decreased left ventricular   diastolic compliance and/or increased left atrial pressure.   Doppler parameters are consistent with high ventricular filling   pressure. - Regional wall motion abnormality: Hypokinesis of  the basal   inferoseptal myocardium; moderate hypokinesis of the apical   anterior, apical septal, apical lateral, and apical myocardium. 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 No significant bioprosthetic valvular   stenosis. There was no regurgitation. Peak velocity (S): 290   cm/s. Mean gradient (S): 15 mm Hg. Valve area (VTI): 2.16 cm^2.   Valve area (Vmax): 2.08 cm^2. Valve area (Vmean): 2.15 cm^2. - Mitral valve: Calcified annulus. Mildly thickened leaflets .   There was moderate regurgitation. There is some acoustic   shadowing from mitral annular calcification. - Left atrium: The atrium was severely dilated. - Right ventricle: Pacer wire or catheter noted in right ventricle. - Right atrium: The  atrium was mildly dilated. - Tricuspid valve: There was mild regurgitation. - Pulmonic valve: There was mild regurgitation.  Impressions: - I compared today&'s study to the study dated 07/19/16. LVEF appears   slightly better in some views and worse in others, but appears to   be in the 40% range. The apex, apical septum, anteroapical wall,   and anterolateral walls may be slightly more hypokinetic than   before.   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  Assessment & Plan    CAD:  -Pt admitted with chest tightness and significant DOE with minimal activity -known total RCA with collaterals  -elevated troponin  -R&L heart cath done yesterday showed mild elevation of right heart pressures with mild pulmonary hypertension, well-seated TAVR valve, Multi-vessel CAD with a calcified proximal to mid LAD with 60-70% proximal LAD stenosis, which has slightly progressed from previously, 30-40% mid AV groove circumflex stenoses with 30% stenosis in the distal marginal branch; and previously noted old total occlusion of the mid RCA with left-to-right collaterals to the PDA vessel. Recommendation os for increased medical therapy and if pt develops recurrent symptomatolgy, consider PCI with atherectomy to the prox LAD lesion.   -Atorvastatin has been increased to 80 mg. Isordil added, metoprolol increased to 25 mg bid, on aspirin 81 mg (amlodipine was stopped to make room in BP for heart meds) -Chest pain has resolved and breathing is better since admission with dialysis, med changes and breathing treatments.   Chronic systolic heart failure -EF 40% on echo -Fluid status controlled with dialysis. No appears euvolemic.  S/p TAVR:   -SEM on  exam, echo shows no perivalvular leak, gradient 15 mmHg -Heart cath showed well-seated TAVR valve  CRF   -Dialysis per nephrology, due again 6/8 after this am norvasc DC to support BP  -Fistula LUE ok  Daune Perch, AGNP-C 05/02/2017  11:36 AM Pager: [phone]  Patient examined chart reviewed. Lungs less rales breathing better moderate proximal LAD stenosis Medical Rx. Fluid status adjusted at dialysis Normal functioning TAVR valve Ok to d/c home per primary Service will arrange outpatient f/u with Dr Bertram Savin

## 2017-05-02 NOTE — Progress Notes (Signed)
[facility] TEAM 1 - Stepdown/ICU TEAM  Jeffrey [name]  QBH:[id number] DOB: 11-Sep-1941 DOA: 04/27/2017 PCP: Thurman Coyer, MD    Brief Narrative:  76 y.o.M Hx ESRD on HD M/W/F, atrial fibrillation S/P pacemaker, CAD with chronic RCA occlusion, severe aortic stenosis S/P Bioprosthetic TAVR, chronic systolic and diastolic CHF (EF 09-73% w/ grade 1 diastolic dysfunction on echo 07/19/16), COPD, and DM2 who presented with chest pain which radiated into his L shoulder associated w/ SOB.    Subjective: The patient is resting comfortably in the bed at the time of visit.  He is in no acute respiratory distress and is suffering with no uncontrolled pain.  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   DVT prophylaxis: SQ heparin  Code Status: FULL CODE Family Communication: no family is present at the time of my visit  Disposition Plan: PT/OT evals - ambulate - if does well w/ ambulation can consider d/c home  post HD 6/8  Consultants:  Cardiology Nephrology   Antimicrobials:  Levaquin 6/3 > 6/7  Objective: Blood pressure (!) 115/43, pulse 68, temperature 97.5 F (36.4 C), temperature source Oral, resp. rate 12, height 5\' 11"  (1.803 m), weight 101 kg (222 lb 10.6 oz), SpO2 97 %.  Intake/Output Summary (Last 24 hours) at 05/02/17 1639 Last data filed at 05/02/17 1438  Gross per 24 hour  Intake             1190 ml  Output                0 ml  Net             1190 ml   Filed Weights   05/01/17 0500 05/01/17 0655 05/01/17 1104  Weight: 103.9 kg (229 lb 0.9 oz) 103.9 kg (229 lb 0.9 oz) 101 kg (222 lb 10.6 oz)    Examination: General: No acute respiratory distress Lungs: Clear to auscultation bilaterally without wheeze Cardiovascular: Regular rate and rhythm without murmur  Abdomen: soft, bowel sounds positive Extremities: No significant edema bilateral lower extremities   CBC:  Recent Labs Lab 04/29/17 0401 04/30/17 0242 05/01/17 0248 05/01/17 1556 05/02/17 0237  WBC 14.7* 13.2* 14.7* 13.1* 9.2  HGB 9.0* 9.3* 9.0* 9.3* 8.6*  HCT 28.2* 29.3* 27.2* 29.5* 26.7*  MCV 94.0 94.2 91.6 94.6 95.4  PLT 281 259 241 209 532   Basic Metabolic Panel:  Recent Labs Lab 04/28/17 1053 04/29/17 0401 04/30/17 0242 05/01/17 0248 05/01/17 1556 05/02/17 0237  NA 132* 134* 131*  128*  --  132*  K 4.0 4.9 4.0 3.8  --  4.6  CL 94* 92* 93* 90*  --  95*  CO2 26 29 27 24   --  25  GLUCOSE 357* 267* 308* 160*  --  280*  BUN 36* 49* 30* 52*  --  34*  CREATININE 3.08* 3.98* 3.02* 4.46* 2.46* 3.53*  CALCIUM 8.5* 8.2* 7.8* 7.6*  --  7.4*  MG  --  2.0  --   --   --   --   PHOS 3.6  --   --   --   --   --    GFR: Estimated Creatinine Clearance: 20.9 mL/min (A) (by C-G formula based on SCr of 3.53 mg/dL (H)).  Liver Function Tests:  Recent Labs Lab 04/27/17 1357 04/28/17 1053  AST 20  --   ALT 12*  --   ALKPHOS 98  --   BILITOT 1.2  --   PROT 7.0  --   ALBUMIN 3.1* 2.9*    Cardiac  Enzymes:  Recent Labs Lab 04/27/17 2230 04/28/17 0541 04/28/17 1053  TROPONINI 1.44* 1.39* 1.86*    HbA1C: Hgb A1c MFr Bld  Date/Time Value Ref Range Status  04/29/2017 04:01 AM 7.0 (H) 4.8 - 5.6 % Final    Comment:    (NOTE)         Pre-diabetes: 5.7 - 6.4         Diabetes: >6.4         Glycemic control for adults with diabetes: <7.0   08/01/2015 12:50 PM 5.6 4.8 - 5.6 % Final    Comment:    (NOTE)         Pre-diabetes: 5.7 - 6.4         Diabetes: >6.4         Glycemic control for adults with diabetes: <7.0     CBG:  Recent Labs Lab 05/01/17 1428 05/01/17 1634 05/01/17 2109 05/02/17 0823 05/02/17 1204  GLUCAP 106* 195* 232* 207* 146*    Recent Results (from the past 240 hour(s))  MRSA PCR Screening     Status: None   Collection Time: 04/28/17  9:58 AM  Result Value Ref Range Status   MRSA by PCR NEGATIVE NEGATIVE Final    Comment:        The GeneXpert MRSA Assay (FDA approved for NASAL specimens only), is one component of a comprehensive MRSA colonization surveillance program. It is not intended to diagnose MRSA infection nor to guide or monitor treatment for MRSA infections.   Respiratory Panel by PCR     Status: None   Collection Time: 04/28/17  4:07 PM  Result Value Ref Range Status   Adenovirus NOT DETECTED NOT DETECTED Final   Coronavirus 229E NOT DETECTED NOT DETECTED Final   Coronavirus HKU1 NOT DETECTED NOT DETECTED Final   Coronavirus NL63 NOT DETECTED NOT DETECTED Final   Coronavirus OC43 NOT DETECTED NOT DETECTED Final   Metapneumovirus NOT DETECTED NOT DETECTED Final   Rhinovirus / Enterovirus NOT DETECTED NOT DETECTED Final   Influenza A NOT DETECTED NOT DETECTED Final   Influenza B NOT DETECTED NOT DETECTED Final   Parainfluenza Virus 1 NOT DETECTED NOT DETECTED Final   Parainfluenza Virus 2 NOT DETECTED NOT DETECTED Final   Parainfluenza Virus 3 NOT DETECTED NOT DETECTED Final   Parainfluenza Virus 4 NOT DETECTED NOT DETECTED  Final   Respiratory Syncytial Virus NOT DETECTED NOT DETECTED Final   Bordetella pertussis NOT DETECTED  NOT DETECTED Final   Chlamydophila pneumoniae NOT DETECTED NOT DETECTED Final   Mycoplasma pneumoniae NOT DETECTED NOT DETECTED Final     Scheduled Meds: . aspirin EC  81 mg Oral Daily  . atorvastatin  80 mg Oral Daily  . cinacalcet  30 mg Oral Q supper  . famotidine  20 mg Oral Daily  . feeding supplement (NEPRO CARB STEADY)  237 mL Oral BID BM  . heparin  5,000 Units Subcutaneous Q8H  . insulin aspart  0-20 Units Subcutaneous TID WC  . insulin aspart  0-5 Units Subcutaneous QHS  . insulin aspart  12 Units Subcutaneous TID WC  . insulin detemir  48 Units Subcutaneous QHS  . ipratropium-albuterol  3 mL Nebulization TID  . isosorbide dinitrate  20 mg Oral TID  . levofloxacin  500 mg Oral Q48H  . mouth rinse  15 mL Mouth Rinse BID  . metoprolol tartrate  25 mg Oral BID  . mometasone-formoterol  2 puff Inhalation BID  . multivitamin  1 tablet Oral QHS  . predniSONE  50 mg Oral Q breakfast  . sevelamer carbonate  1,600 mg Oral BID  . sevelamer carbonate  3,200 mg Oral TID WC  . sodium chloride flush  3 mL Intravenous Q12H     LOS: 5 days   Cherene Altes, MD [facility] Office  [phone] Pager - Text Page per Shea Evans as per below:  On-Call/Text Page:      [email or web address]      password [email or web address]  If 7PM-7AM, please contact night-coverage [email or web address] Password [email or web address] 05/02/2017, 4:39 PM

## 2017-05-03 DIAGNOSIS — E8770 Fluid overload, unspecified: Secondary | ICD-10-CM

## 2017-05-03 DIAGNOSIS — I2510 Atherosclerotic heart disease of native coronary artery without angina pectoris: Secondary | ICD-10-CM

## 2017-05-03 DIAGNOSIS — J441 Chronic obstructive pulmonary disease with (acute) exacerbation: Secondary | ICD-10-CM

## 2017-05-03 LAB — CBC
HEMATOCRIT: 26.1 % — AB (ref 39.0–52.0)
HEMOGLOBIN: 8.5 g/dL — AB (ref 13.0–17.0)
MCH: 30.8 pg (ref 26.0–34.0)
MCHC: 32.6 g/dL (ref 30.0–36.0)
MCV: 94.6 fL (ref 78.0–100.0)
Platelets: 185 10*3/uL (ref 150–400)
RBC: 2.76 MIL/uL — ABNORMAL LOW (ref 4.22–5.81)
RDW: 18 % — ABNORMAL HIGH (ref 11.5–15.5)
WBC: 9.4 10*3/uL (ref 4.0–10.5)

## 2017-05-03 LAB — GLUCOSE, CAPILLARY
GLUCOSE-CAPILLARY: 196 mg/dL — AB (ref 65–99)
Glucose-Capillary: 111 mg/dL — ABNORMAL HIGH (ref 65–99)
Glucose-Capillary: 191 mg/dL — ABNORMAL HIGH (ref 65–99)

## 2017-05-03 LAB — BASIC METABOLIC PANEL
ANION GAP: 12 (ref 5–15)
BUN: 61 mg/dL — AB (ref 6–20)
CO2: 25 mmol/L (ref 22–32)
Calcium: 7.5 mg/dL — ABNORMAL LOW (ref 8.9–10.3)
Chloride: 94 mmol/L — ABNORMAL LOW (ref 101–111)
Creatinine, Ser: 4.96 mg/dL — ABNORMAL HIGH (ref 0.61–1.24)
GFR calc non Af Amer: 10 mL/min — ABNORMAL LOW (ref >60)
GFR, EST AFRICAN AMERICAN: 12 mL/min — AB (ref >60)
Glucose, Bld: 311 mg/dL — ABNORMAL HIGH (ref 65–99)
POTASSIUM: 4.2 mmol/L (ref 3.5–5.1)
SODIUM: 131 mmol/L — AB (ref 135–145)

## 2017-05-03 MED ORDER — INSULIN DETEMIR 100 UNIT/ML ~~LOC~~ SOLN: 48.0000 [IU] | Freq: Every day | SUBCUTANEOUS | 0 refills | Status: DC

## 2017-05-03 MED ORDER — ISOSORBIDE DINITRATE 20 MG PO TABS: 20.0000 mg | ORAL_TABLET | Freq: Three times a day (TID) | ORAL | 0 refills | Status: DC

## 2017-05-03 MED ORDER — METOPROLOL TARTRATE 25 MG PO TABS: 25.0000 mg | ORAL_TABLET | Freq: Two times a day (BID) | ORAL | 0 refills | Status: DC

## 2017-05-03 MED ORDER — MOMETASONE FURO-FORMOTEROL FUM 200-5 MCG/ACT IN AERO: 2.0000 | INHALATION_SPRAY | Freq: Two times a day (BID) | RESPIRATORY_TRACT | 0 refills | Status: DC

## 2017-05-03 MED ORDER — CALCIUM CARBONATE ANTACID 500 MG PO CHEW: 1.0000 | CHEWABLE_TABLET | Freq: Four times a day (QID) | ORAL | 0 refills | Status: DC | PRN

## 2017-05-03 MED ORDER — SEVELAMER CARBONATE 800 MG PO TABS: 3200.0000 mg | ORAL_TABLET | Freq: Three times a day (TID) | ORAL | 0 refills | Status: DC

## 2017-05-03 MED ORDER — ATORVASTATIN CALCIUM 40 MG PO TABS: 80.0000 mg | ORAL_TABLET | Freq: Every day | ORAL | 0 refills | Status: DC

## 2017-05-03 MED ORDER — NEPRO/CARBSTEADY PO LIQD
237.0000 mL | Freq: Two times a day (BID) | ORAL | 0 refills | Status: DC
Start: 2017-05-04 — End: 2017-11-21

## 2017-05-03 MED ORDER — SEVELAMER CARBONATE 800 MG PO TABS
1600.0000 mg | ORAL_TABLET | Freq: Two times a day (BID) | ORAL | 0 refills | Status: DC
Start: 2017-05-04 — End: 2017-09-26

## 2017-05-03 NOTE — Procedures (Signed)
I was present at this dialysis session. I have reviewed the session itself and made appropriate changes.   2L [place], goal post weight 100kg.  Pt feels well.  Needs PT eval post HD.  He wants to go home today.  Filed Weights   05/01/17 0655 05/01/17 1104 05/03/17 0500  Weight: 103.9 kg (229 lb 0.9 oz) 101 kg (222 lb 10.6 oz) 104.3 kg (229 lb 15 oz)     Recent Labs Lab 04/28/17 1053  05/03/17 0249  NA 132*  < > 131*  K 4.0  < > 4.2  CL 94*  < > 94*  CO2 26  < > 25  GLUCOSE 357*  < > 311*  BUN 36*  < > 61*  CREATININE 3.08*  < > 4.96*  CALCIUM 8.5*  < > 7.5*  PHOS 3.6  --   --   < > = values in this interval not displayed.   Recent Labs Lab 05/01/17 1556 05/02/17 0237 05/03/17 0249  WBC 13.1* 9.2 9.4  HGB 9.3* 8.6* 8.5*  HCT 29.5* 26.7* 26.1*  MCV 94.6 95.4 94.6  PLT 209 197 185    Scheduled Meds: . aspirin EC  81 mg Oral Daily  . atorvastatin  80 mg Oral Daily  . cinacalcet  30 mg Oral Q supper  . famotidine  20 mg Oral Daily  . feeding supplement (NEPRO CARB STEADY)  237 mL Oral BID BM  . heparin  5,000 Units Subcutaneous Q8H  . insulin aspart  0-20 Units Subcutaneous TID WC  . insulin aspart  0-5 Units Subcutaneous QHS  . insulin aspart  12 Units Subcutaneous TID WC  . insulin detemir  48 Units Subcutaneous QHS  . ipratropium-albuterol  3 mL Nebulization TID  . isosorbide dinitrate  20 mg Oral TID  . mouth rinse  15 mL Mouth Rinse BID  . metoprolol tartrate  25 mg Oral BID  . mometasone-formoterol  2 puff Inhalation BID  . multivitamin  1 tablet Oral QHS  . predniSONE  50 mg Oral Q breakfast  . sevelamer carbonate  1,600 mg Oral BID  . sevelamer carbonate  3,200 mg Oral TID WC   Continuous Infusions: . ferric gluconate (FERRLECIT/NULECIT) IV Stopped (05/01/17 1106)   PRN Meds:.acetaminophen, albuterol, calcium carbonate, morphine injection, ondansetron (ZOFRAN) IV   Pearson Grippe  MD 05/03/2017, 8:02 AM

## 2017-05-03 NOTE — Progress Notes (Signed)
Remote pacemaker transmission.   

## 2017-05-03 NOTE — Discharge Summary (Signed)
Physician Discharge Summary  Jeffrey [name] OEV:[id number] DOB: Apr 18, 1941 DOA: 04/27/2017  PCP: Thurman Coyer, MD  Admit date: 04/27/2017 Discharge date: 05/03/2017  Time spent: 30 minutes  Recommendations for Outpatient Follow-up:   Chest pain in a patient with known CAD/angina/elevated troponin/NSTEMI -Patient most likely suffered mild NSTEMI. Unfortunate patient appears to failed conservative medical management, recurrent CAP overnight. -right/left heart catheterization on 6/6: Showed extensive disease see results below -Strict in and out. Since admission - 3.5 L -Isosorbide dinitrate  -Metoprolol -Follow-up with Dr. 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 Have refused all outpatient help.  Severe aortic stenosisstatus post transcatheter aortic valve replacement -Status post TAVR. -Follow-up with Dr. 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  -HD per nephrology -6/3 echocardiogram LVEF=40%. Diffuse hypokinesis -Spoke with patient and wife who stated does not want home PT, does not want outpatient physical therapy, does not want cardiac rehabilitation. Have refused all outpatient help.   Hypertension -DC Amlodipine secondary to CHF  -See NSTEMI  Automatic implantable cardioverter-defibrillator in  situ   CAP left lower lobe -All of patient has been afebrile since admission, given patient's leukocytosis and significant core morbidities will treat empirically for CAP. 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 with long-term current use of insulin (HCC) -6/4 Hemoglobin A1c= 7.0 -Increase Levemir 48 units daily at bedtime.  Idiopathic peripheral neuropathy -Most likely secondary to his diabetes,   ESRD (end stage renal disease) on dialysis [facility] HD M/W/F)/secondary hyperparathyroidism -Treatment per Nephrology. Continue Sensipar, Renvela and calcium carbonate.  Hyperlipidemia  -Continue Lipitor 80 mg daily  Hyponatremia -Resolved  Morbid obesity due to excess calories (HCC) Body mass index is 32.08 kg/m.   Goals of care  -Spoke with patient and wife who stated does not want home PT, does not want outpatient physical therapy, does not want cardiac rehabilitation. Have refused all outpatient help.    Discharge Diagnoses:  Principal Problem:   Acute on chronic respiratory failure with hypoxia (HCC) Active Problems:   Type 2 diabetes mellitus with chronic kidney disease on chronic dialysis, with long-term current use of insulin (HCC)   Essential hypertension   Elevated troponin   ESRD (end stage renal disease) on dialysis [facility])   Atherosclerosis of native coronary artery of native heart with angina pectoris (HCC)   Fluid overload   Chronic combined systolic and diastolic CHF (congestive heart failure) (HCC)   Severe aortic stenosis   Hypervolemia   S/P TAVR (transcatheter aortic valve replacement)   Hyperlipidemia   Automatic implantable cardioverter-defibrillator in situ   Idiopathic peripheral neuropathy   Hyponatremia   Morbid obesity due to excess calories [facility])   Coronary  artery disease   Discharge Condition: Stable  Diet recommendation: Heart healthy  Filed Weights   05/03/17 0500 05/03/17 0745 05/03/17  1149  Weight: 229 lb 15 oz (104.3 kg) 227 lb 11.8 oz (103.3 kg) 219 lb 9.3 oz (99.6 kg)    History of present illness:  76 y.o.WM PMHx  ESRD on HD M/W/Fand was last dialyzed 04/26/17, atrial fibrillation S/P  pacemaker, CAD with chronic RCA occlusion, severe aortic stenosis S/P Bioprosthetic TAVR, chronic systolic and diastolic CHF (EF 00-86 percent, diffuse hypokinesis, grade 1 diastolic dysfunction noted on echo 07/19/16), COPD, DM Type 2.   who presents with chest pain that started gradually at 8:30 this morning. Chest pain radiated to left shoulder, took a NTG, which eased pain off for about 1 hour but then the pain came back so he took an additional NTG which eased the pain off for another hour, then when it came back the 3rd time, he took another NTG but the 3rd time it did not ease the pain off, so he came to the ED for evaluation. Has been on oxygen at home since his previous hospital stay, and symptoms are associated with worsening shortness of breath.  Patient suffered what appeared to be a mild NSTEMI patient was evaluated by cardiology and determine medical management best course of action. Patient extremely debilitated however Spoke with patient and wife who stated does not want home PT, does not want outpatient physical therapy, does not want cardiac rehabilitation. Have refused all outpatient help.   Procedures: 6/2 PCXR:Lateral airspace disease compatible with diffuse infection or asymmetric pulmonary edema. 6/3 Echocardiogram:- Left ventricle: moderate LVH. Systolic function -PYPP=50%. Diffuse hypokinesis.  - Regional wall motion abnormality: Hypokinesis of the basal,inferoseptal myocardium; moderate hypokinesis of the apicalanterior, apical septal, apical lateral, and apical myocardium. - Aortic valve: Bioprosthetic aortic valve s/p  transcatheter aortic valve replacement. No significant bioprosthetic valvular stenosis. There was moderate regurgitation.  - Left atrium: severely dilated. - Right ventricle: Pacer wire or catheter noted in right ventricle. 6/6 right heart catheterization: Multi-vessel CAD with a calcified proximal to mid LAD with 60-70% proximal LAD stenosis, which has slightly progressed from previously, 30-40% mid AV groove circumflex stenoses with 30% stenosis in the distal marginal branch; and previously noted old total occlusion of the mid RCA with left-to-right collaterals to the PDA vessel.   Consultations: Nephrology Dr. Posey Pronto,  Cardiology Dr. Candee Furbish  Antibiotics Anti-infectives    Start     Stop   04/30/17 1800  levofloxacin (LEVAQUIN) tablet 500 mg  Status:  Discontinued     05/02/17 1740   04/29/17 2000  levofloxacin (LEVAQUIN) IVPB 500 mg  Status:  Discontinued     04/28/17 1421   04/28/17 1500  levofloxacin (LEVAQUIN) tablet 750 mg     04/28/17 1606   04/27/17 1930  levofloxacin (LEVAQUIN) IVPB 750 mg  Status:  Discontinued     04/28/17 1421       Discharge Exam: Vitals:   05/03/17 1130 05/03/17 1149 05/03/17 1227 05/03/17 1631  BP: (!) 142/65 137/68 (!) 134/54 119/77  Pulse: 69 70 62   Resp:  14 17   Temp:  98 F (36.7 C) 97.9 F (36.6 C) 98.2 F (36.8 C)  TempSrc:  Oral Oral Oral  SpO2:  98% 98%   Weight:  219 lb 9.3 oz (99.6 kg)    Height:        General: A/O 4, positive acute respiratory distress Eyes: negative scleral hemorrhage, negative anisocoria, negative icterus ENT: Negative Runny nose, negative gingival bleeding, Neck:  Negative scars, masses, torticollis, lymphadenopathy, JVD Lungs: diffuse poor air movement positive mild  wheezes, negative crackles Cardiovascular: Regular rate and rhythm, loud systolic murmur, negative gallop or rub normal S1 and S2 Abdomen: negative abdominal pain, nondistended, positive soft, bowel sounds, no rebound, no ascites, no  appreciable mass Extremities: No significant cyanosis, clubbing, +1-2+ edema bilateral lower extremities Discharge Instructions   Allergies as of 05/03/2017      Reactions   Byetta 10 Mcg Pen [exenatide] Diarrhea, Nausea And Vomiting   Codeine Itching      Medication List    STOP taking these medications   amLODipine 10 MG tablet Commonly known as:  NORVASC   budesonide-formoterol 160-4.5 MCG/ACT inhaler Commonly known as:  SYMBICORT Replaced by:  mometasone-formoterol 200-5 MCG/ACT Aero     TAKE these medications   albuterol 108 (90 Base) MCG/ACT inhaler Commonly known as:  PROVENTIL HFA;VENTOLIN HFA Inhale into the lungs.   aspirin EC 325 MG tablet Take 1 tablet (325 mg total) by mouth daily.   atorvastatin 40 MG tablet Commonly known as:  LIPITOR Take 2 tablets (80 mg total) by mouth daily. What changed:  how much to take   calcium carbonate 500 MG chewable tablet Commonly known as:  TUMS - dosed in mg elemental calcium Chew 1 tablet (200 mg of elemental calcium total) by mouth 4 (four) times daily as needed for indigestion or heartburn.   cinacalcet 30 MG tablet Commonly known as:  SENSIPAR Take 30 mg by mouth daily with supper.   feeding supplement (NEPRO CARB STEADY) Liqd Take 237 mLs by mouth 2 (two) times daily between meals. Start taking on:  05/04/2017   insulin detemir 100 UNIT/ML injection Commonly known as:  LEVEMIR Inject 0.48 mLs (48 Units total) into the skin at bedtime. What changed:  how much to take  how to take this   ipratropium-albuterol 0.5-2.5 (3) MG/3ML Soln Commonly known as:  DUONEB Take 3 mLs by nebulization every 6 (six) hours.   isosorbide dinitrate 20 MG tablet Commonly known as:  ISORDIL Take 1 tablet (20 mg total) by mouth 3 (three) times daily.   lidocaine-prilocaine cream Commonly known as:  EMLA Apply 1 application topically once as needed (prior to accessing port).   metoprolol tartrate 25 MG tablet Commonly known  as:  LOPRESSOR Take 1 tablet (25 mg total) by mouth 2 (two) times daily. What changed:  how much to take   mometasone-formoterol 200-5 MCG/ACT Aero Commonly known as:  DULERA Inhale 2 puffs into the lungs 2 (two) times daily. Replaces:  budesonide-formoterol 160-4.5 MCG/ACT inhaler   NITROSTAT 0.4 MG SL tablet Generic drug:  nitroGLYCERIN Place 0.4 mg under the tongue every 5 (five) minutes as needed for chest pain.   ranitidine 150 MG tablet Commonly known as:  ZANTAC Take 150 mg by mouth 2 (two) times daily as needed for heartburn.   sevelamer carbonate 800 MG tablet Commonly known as:  RENVELA Take 4 tablets (3,200 mg total) by mouth 3 (three) times daily with meals. What changed:  how much to take  when to take this  additional instructions   sevelamer carbonate 800 MG tablet Commonly known as:  RENVELA Take 2 tablets (1,600 mg total) by mouth 2 (two) times daily at 10 am and 4 pm. Start taking on:  05/04/2017 What changed:  You were already taking a medication with the same name, and this prescription was added. Make sure you understand how and when to take each.      Allergies  Allergen Reactions  . Byetta 10 Mcg Pen [Exenatide] Diarrhea  and Nausea And Vomiting  . Codeine Itching   Follow-up Information    Eileen Stanford, PA-C Follow up.   Specialties:  Cardiology, Radiology Why:  on June 20th at 2:30 for cardiology hospital follow up.  Contact information: [address] [place] [address] [phone]            The results of significant diagnostics from this hospitalization (including imaging, microbiology, ancillary and laboratory) are listed below for reference.    Significant Diagnostic Studies: Dg Chest 2 View  Result Date: 04/16/2017 CLINICAL DATA:  Shortness of Breath EXAM: CHEST  2 VIEW COMPARISON:  03/22/2017 FINDINGS: Cardiac shadow is mildly enlarged. Changes consistent withTAVR are seen. Pacing device is again  noted and stable. Aortic calcifications are seen. The lungs are well aerated bilaterally. Some minimal atelectatic changes are noted in the bases bilaterally. No bony abnormality is seen. IMPRESSION: Minimal bibasilar atelectasis. Electronically Signed   By: Inez Catalina M.D.   On: 04/16/2017 08:52   Dg Chest Portable 1 View  Result Date: 04/27/2017 CLINICAL DATA:  Chest pain EXAM: PORTABLE CHEST 1 VIEW COMPARISON:  04/16/2017 FINDINGS: The cardio pericardial silhouette is enlarged. Asymmetric airspace disease noted, diffusely on the right and involving the left base. Probable small bilateral effusions. The visualized bony structures of the thorax are intact. Telemetry leads overlie the chest. IMPRESSION: Lateral airspace disease compatible with diffuse infection or asymmetric pulmonary edema. Electronically Signed   By: [name] Stanley M.D.   On: 04/27/2017 14:25    Microbiology: Recent Results (from the past 240 hour(s))  MRSA PCR Screening     Status: None   Collection Time: 04/28/17  9:58 AM  Result Value Ref Range Status   MRSA by PCR NEGATIVE NEGATIVE Final    Comment:        The GeneXpert MRSA Assay (FDA approved for NASAL specimens only), is one component of a comprehensive MRSA colonization surveillance program. It is not intended to diagnose MRSA infection nor to guide or monitor treatment for MRSA infections.   Respiratory Panel by PCR     Status: None   Collection Time: 04/28/17  4:07 PM  Result Value Ref Range Status   Adenovirus NOT DETECTED NOT DETECTED Final   Coronavirus 229E NOT DETECTED NOT DETECTED Final   Coronavirus HKU1 NOT DETECTED NOT DETECTED Final   Coronavirus NL63 NOT DETECTED NOT DETECTED Final   Coronavirus OC43 NOT DETECTED NOT DETECTED Final   Metapneumovirus NOT DETECTED NOT DETECTED Final   Rhinovirus / Enterovirus NOT DETECTED NOT DETECTED Final   Influenza A NOT DETECTED NOT DETECTED Final   Influenza B NOT DETECTED NOT DETECTED Final    Parainfluenza Virus 1 NOT DETECTED NOT DETECTED Final   Parainfluenza Virus 2 NOT DETECTED NOT DETECTED Final   Parainfluenza Virus 3 NOT DETECTED NOT DETECTED Final   Parainfluenza Virus 4 NOT DETECTED NOT DETECTED Final   Respiratory Syncytial Virus NOT DETECTED NOT DETECTED Final   Bordetella pertussis NOT DETECTED NOT DETECTED Final   Chlamydophila pneumoniae NOT DETECTED NOT DETECTED Final   Mycoplasma pneumoniae NOT DETECTED NOT DETECTED Final     Labs: Basic Metabolic Panel:  Recent Labs Lab 04/28/17 1053 04/29/17 0401 04/30/17 0242 05/01/17 0248 05/01/17 1556 05/02/17 0237 05/03/17 0249  NA 132* 134* 131* 128*  --  132* 131*  K 4.0 4.9 4.0 3.8  --  4.6 4.2  CL 94* 92* 93* 90*  --  95* 94*  CO2 26 29 27 24   --  25 25  GLUCOSE 357* 267* 308* 160*  --  280* 311*  BUN 36* 49* 30* 52*  --  34* 61*  CREATININE 3.08* 3.98* 3.02* 4.46* 2.46* 3.53* 4.96*  CALCIUM 8.5* 8.2* 7.8* 7.6*  --  7.4* 7.5*  MG  --  2.0  --   --   --   --   --   PHOS 3.6  --   --   --   --   --   --    Liver Function Tests:  Recent Labs Lab 04/27/17 1357 04/28/17 1053  AST 20  --   ALT 12*  --   ALKPHOS 98  --   BILITOT 1.2  --   PROT 7.0  --   ALBUMIN 3.1* 2.9*   No results for input(s): LIPASE, AMYLASE in the last 168 hours. No results for input(s): AMMONIA in the last 168 hours. CBC:  Recent Labs Lab 04/30/17 0242 05/01/17 0248 05/01/17 1556 05/02/17 0237 05/03/17 0249  WBC 13.2* 14.7* 13.1* 9.2 9.4  HGB 9.3* 9.0* 9.3* 8.6* 8.5*  HCT 29.3* 27.2* 29.5* 26.7* 26.1*  MCV 94.2 91.6 94.6 95.4 94.6  PLT 259 241 209 197 185   Cardiac Enzymes:  Recent Labs Lab 04/27/17 2230 04/28/17 0541 04/28/17 1053  TROPONINI 1.44* 1.39* 1.86*   BNP: BNP (last 3 results)  Recent Labs  03/10/17 0403 03/22/17 0503 04/27/17 1352  BNP 656.1* 507.6* 1,193.6*    ProBNP (last 3 results) No results for input(s): PROBNP in the last 8760 hours.  CBG:  Recent Labs Lab 05/02/17 1728  05/02/17 2128 05/03/17 0916 05/03/17 1245 05/03/17 1636  GLUCAP 146* 195* 196* 111* 191*       Signed:  Dia Crawford, MD [facility] [phone] pager

## 2017-05-03 NOTE — Plan of Care (Signed)
Problem: Health Behavior/Discharge Planning: Goal: Ability to manage health-related needs will improve Outcome: Completed/Met Date Met: 05/03/17 Patient declined home health services. Had originally stated he wanted a wheelchair for home but declined these services prior to discharge.

## 2017-05-03 NOTE — Plan of Care (Signed)
Problem: Health Behavior/Discharge Planning: Goal: Ability to manage health-related needs will improve Outcome: Progressing CM following for equipment needs. PT/OT to evaluate prior to discharge.  Problem: Activity: Goal: Risk for activity intolerance will decrease Outcome: Progressing PT/OT eval ordered.

## 2017-05-03 NOTE — Progress Notes (Signed)
OT Cancellation Note  Patient Details Name: KARVER FADDEN MRN: [id number] DOB: 04/08/1941   Cancelled Treatment:    Reason Eval/Treat Not Completed: Patient at procedure or test/ unavailable. Pt in HD.  Will try again this pm.   Lucille Passy, OTR/L [phone]   Lucille Passy M 05/03/2017, 10:24 AM

## 2017-05-03 NOTE — Evaluation (Signed)
Occupational Therapy Evaluation Patient Details Name: Jeffrey [name] MRN: [id number] DOB: 1941/04/17 Today's Date: 05/03/2017    History of Present Illness This 76 y.o. male admitted with radiating CP and SOB.   Pt underwent cardiac cath which showed mild elevation of Rt heart pressures and mild pulmonaary hypertension, well-seated TAVR valve, multi vessel CAD.   Medical therapy has been initiated.  PMH includes:  ESRD with HD 3x/wk; s/p TAVR; chronic systolic heart failure, CAD   Clinical Impression   Pt admitted with above.  He demonstrates generalized weakness, decreased activity tolerance.  He requires min - mod A with ADLs and min A for functional mobility.  He and wife both report he is functioning at his current baseline.  She has been assisting him at home, and states she feels confident continuing (she does appear to potentially be assisting him more than he needs and encouraged him to do as much for self as possible).   They are not interested in further therapy/rehab.  He wants to discharge home ASAP, wife agreeable.  They do not feel they need PT to see, and do not feel they need to practice steps before home (he has 4 to enter).  Wife states they have been doing this regularly, and have a system.   He would like a manual w/c for community mobility, and when he is fatigued at home, and would benefit from a tub transfer bench.   They do not want HH therapies, nor a HH aide.  OT will sign of at this time.      Follow Up Recommendations  No OT follow up;Supervision/Assistance - 24 hour    Equipment Recommendations  Wheelchair (measurements OT);Tub/shower bench    Recommendations for Other Services       Precautions / Restrictions Precautions Precautions: Fall      Mobility Bed Mobility Overal bed mobility: Modified Independent                Transfers Overall transfer level: Needs assistance Equipment used: Standard walker Transfers: Sit to/from Stand;Stand Pivot  Transfers Sit to Stand: Min guard Stand pivot transfers: Min assist       General transfer comment: assist for balance    Balance Overall balance assessment: Needs assistance Sitting-balance support: Feet supported Sitting balance-Leahy Scale: Good     Standing balance support: Bilateral upper extremity supported;During functional activity Standing balance-Leahy Scale: Poor Standing balance comment: Pt heavily reliant on UE support                            ADL either performed or assessed with clinical judgement   ADL Overall ADL's : Needs assistance/impaired Eating/Feeding: Independent   Grooming: Wash/dry hands;Wash/dry face;Oral care;Brushing hair;Set up;Sitting   Upper Body Bathing: Set up;Sitting   Lower Body Bathing: Minimal assistance;Sit to/from stand   Upper Body Dressing : Minimal assistance;Sitting   Lower Body Dressing: Minimal assistance;Sit to/from stand Lower Body Dressing Details (indicate cue type and reason): Pt is able to don/doff socks, but wife reports she does this for him at home  Toilet Transfer: Minimal assistance;Ambulation;Comfort height toilet;Grab bars;RW   Toileting- Clothing Manipulation and Hygiene: Minimal assistance;Sit to/from stand Toileting - Clothing Manipulation Details (indicate cue type and reason): assist to pull  up pants    Tub/Shower Transfer Details (indicate cue type and reason): Discussed options for tub seats with pt and wife - ultimately recommend Tub transfer bench, but instructed them how to use fold  down metal chair or plastic lawn chair with rubber mat  Functional mobility during ADLs: Minimal assistance;Rolling walker General ADL Comments: Wife reports she feels comfortable assisting him with ADLs.  Encouraged her to allow pt to do as much for self as able, but unsure that will occur      Vision         Perception     Praxis      Pertinent Vitals/Pain Pain Assessment: No/denies pain     Hand  Dominance Right   Extremity/Trunk Assessment Upper Extremity Assessment Upper Extremity Assessment: Generalized weakness   Lower Extremity Assessment Lower Extremity Assessment: Generalized weakness   Cervical / Trunk Assessment Cervical / Trunk Assessment: Normal   Communication Communication Communication: No difficulties   Cognition Arousal/Alertness: Awake/alert Behavior During Therapy: WFL for tasks assessed/performed Overall Cognitive Status: Within Functional Limits for tasks assessed                                     General Comments  Pt and wife request w/c for home to make it easier to take him to appts or when he is too fatigued to ambulate at home     Exercises     Shoulder Instructions      Home Living Family/patient expects to be discharged to:: Private residence Living Arrangements: Spouse/significant other Available Help at Discharge: Family;Available 24 hours/day Type of Home: House Home Access: Stairs to enter [employer] of Steps: 4 Entrance Stairs-Rails: Right;Left;Can reach both Home Layout: One level     Bathroom Shower/Tub: Corporate investment banker: Standard Bathroom Accessibility: Yes   Home Equipment: Environmental consultant - 2 wheels;Grab bars - tub/shower;Grab bars - toilet;Cane - single point   Additional Comments: home 02      Prior Functioning/Environment Level of Independence: Needs assistance  Gait / Transfers Assistance Needed: Pt reports he has been using RW to ambulate short distances at home  with min A from wife, since admission ~3wks ago.  They use w/c at dialysis center to transport him into HD center  ADL's / Homemaking Assistance Needed: Pt and wife report she assists him with bathing and dressing as well as IADLs    Comments: wife confirms pt status and reports she is able to continue to assist him and feels comfortable with assisting him  at discharge         OT Problem List: Decreased  strength;Decreased activity tolerance;Impaired balance (sitting and/or standing);Decreased safety awareness;Decreased knowledge of use of DME or AE      OT Treatment/Interventions:      OT Goals(Current goals can be found in the care plan section) Acute Rehab OT Goals Patient Stated Goal: to go home ASAP  OT Goal Formulation: All assessment and education complete, DC therapy  OT Frequency:     Barriers to D/C:            Co-evaluation              AM-PAC PT "6 Clicks" Daily Activity     Outcome Measure Help from another person eating meals?: None Help from another person taking care of personal grooming?: A Little Help from another person toileting, which includes using toliet, bedpan, or urinal?: A Little Help from another person bathing (including washing, rinsing, drying)?: A Little Help from another person to put on and taking off regular upper body clothing?: A Lot Help from another person to  put on and taking off regular lower body clothing?: A Lot 6 Click Score: 17   End of Session Equipment Utilized During Treatment: Gait belt;Rolling walker Nurse Communication: Mobility status  Activity Tolerance: Patient limited by fatigue Patient left: in bed;with call bell/phone within reach;with family/visitor present  OT Visit Diagnosis: Unsteadiness on feet (R26.81);Muscle weakness (generalized) (M62.81)                Time: 1655-3748 OT Time Calculation (min): 22 min Charges:  OT General Charges $OT Visit: 1 Procedure OT Evaluation $OT Eval Moderate Complexity: 1 Procedure G-Codes:     Lucille Passy, OTR/L [phone]   Marston, Nester Bachus M 05/03/2017, 3:01 PM

## 2017-05-03 NOTE — Progress Notes (Signed)
Patient discharged to home with family. PIV x1 removed. Discharge instructions reviewed with patient and patient's spouse including care of right femoral incision site and s/s incision site infection. Prescriptions sent with patient. Patient originally requested a wheelchair at discharge but this was unable to be obtained before case management left for the day. Patient stated he just wanted to go home and didn't want to wait for equipment.   Joellen Jersey, RN

## 2017-05-03 NOTE — Progress Notes (Signed)
PT Cancellation Note  Patient Details Name: Jeffrey [name] MRN: [id number] DOB: 03/22/1941   Cancelled Treatment:    Reason Eval/Treat Not Completed: Patient at procedure or test/unavailable; currently in HD.  Will attempt later as time permits.    Reginia Naas 05/03/2017, 12:07 PM  Magda Kiel, [place] 05/03/2017

## 2017-05-03 NOTE — Progress Notes (Signed)
PT Cancellation Note  Patient Details Name: Jeffrey [name] MRN: [id number] DOB: [birth year]   Cancelled Treatment:    Reason Eval/Treat Not Completed: PT screened, no needs identified, will sign off. Per OT pt and wife feel pt close to baseline and they are comfortable managing his care at home without therapy involvement.   Shary Decamp [place] 05/03/2017, 3:55 PM  [employer] PT [phone]

## 2017-05-03 NOTE — Progress Notes (Signed)
Progress Note  Patient Name: Jeffrey [name] Date of Encounter: 05/03/2017  Primary Cardiologist: Lequita Halt / Marlou Porch  Subjective   No chest pain or dyspnea Finishing dialysis going home today   Inpatient Medications    Scheduled Meds: . aspirin EC  81 mg Oral Daily  . atorvastatin  80 mg Oral Daily  . cinacalcet  30 mg Oral Q supper  . famotidine  20 mg Oral Daily  . feeding supplement (NEPRO CARB STEADY)  237 mL Oral BID BM  . heparin  5,000 Units Subcutaneous Q8H  . insulin aspart  0-20 Units Subcutaneous TID WC  . insulin aspart  0-5 Units Subcutaneous QHS  . insulin aspart  12 Units Subcutaneous TID WC  . insulin detemir  48 Units Subcutaneous QHS  . ipratropium-albuterol  3 mL Nebulization TID  . isosorbide dinitrate  20 mg Oral TID  . mouth rinse  15 mL Mouth Rinse BID  . metoprolol tartrate  25 mg Oral BID  . mometasone-formoterol  2 puff Inhalation BID  . multivitamin  1 tablet Oral QHS  . predniSONE  50 mg Oral Q breakfast  . sevelamer carbonate  1,600 mg Oral BID  . sevelamer carbonate  3,200 mg Oral TID WC   Continuous Infusions:  PRN Meds: acetaminophen, albuterol, calcium carbonate, morphine injection, ondansetron (ZOFRAN) IV   Vital Signs    Vitals:   05/03/17 1030 05/03/17 1100 05/03/17 1130 05/03/17 1149  BP: (!) 148/65 133/62 (!) 142/65 137/68  Pulse: 68 70 69 70  Resp:    14  Temp:    98 F (36.7 C)  TempSrc:    Oral  SpO2:    98%  Weight:    219 lb 9.3 oz (99.6 kg)  Height:        Intake/Output Summary (Last 24 hours) at 05/03/17 1211 Last data filed at 05/03/17 1149  Gross per 24 hour  Intake              360 ml  Output             3700 ml  Net            -3340 ml   Filed Weights   05/03/17 0500 05/03/17 0745 05/03/17 1149  Weight: 229 lb 15 oz (104.3 kg) 227 lb 11.8 oz (103.3 kg) 219 lb 9.3 oz (99.6 kg)    Telemetry    V pacing - Personally Reviewed  ECG    Paced - Personally Reviewed  Physical Exam  Fistula LUE with bruit  and thrill Pacer under left clavicle Affect appropriate Obese chronically ill white male  HEENT: normal Neck supple with no adenopathy JVP normal no bruits no thyromegaly Lungs clear with no wheezing and good diaphragmatic motion Heart:  S1/S2 SEM through TAVR valve no AR  murmur, no rub, gallop or click PMI normal Abdomen: benighn, BS positve, no tenderness, no AAA no bruit.  No HSM or HJR Distal pulses intact with no bruits No edema Neuro non-focal Skin warm and dry No muscular weakness   Right groin cath site bruised but soft and without tenderness or hematoma.   Labs    Chemistry  Recent Labs Lab 04/27/17 1357  04/28/17 1053  05/01/17 0248 05/01/17 1556 05/02/17 0237 05/03/17 0249  NA  --   < > 132*  < > 128*  --  132* 131*  K  --   < > 4.0  < > 3.8  --  4.6 4.2  CL  --   < > 94*  < > 90*  --  95* 94*  CO2  --   < > 26  < > 24  --  25 25  GLUCOSE  --   < > 357*  < > 160*  --  280* 311*  BUN  --   < > 36*  < > 52*  --  34* 61*  CREATININE  --   < > 3.08*  < > 4.46* 2.46* 3.53* 4.96*  CALCIUM  --   < > 8.5*  < > 7.6*  --  7.4* 7.5*  PROT 7.0  --   --   --   --   --   --   --   ALBUMIN 3.1*  --  2.9*  --   --   --   --   --   AST 20  --   --   --   --   --   --   --   ALT 12*  --   --   --   --   --   --   --   ALKPHOS 98  --   --   --   --   --   --   --   BILITOT 1.2  --   --   --   --   --   --   --   GFRNONAA  --   < > 18*  < > 11* 24* 15* 10*  GFRAA  --   < > 21*  < > 13* 27* 18* 12*  ANIONGAP  --   < > 12  < > 14  --  12 12  < > = values in this interval not displayed.   Hematology  Recent Labs Lab 05/01/17 1556 05/02/17 0237 05/03/17 0249  WBC 13.1* 9.2 9.4  RBC 3.12* 2.80* 2.76*  HGB 9.3* 8.6* 8.5*  HCT 29.5* 26.7* 26.1*  MCV 94.6 95.4 94.6  MCH 29.8 30.7 30.8  MCHC 31.5 32.2 32.6  RDW 18.1* 18.1* 18.0*  PLT 209 197 185    Cardiac Enzymes  Recent Labs Lab 04/27/17 2230 04/28/17 0541 04/28/17 1053  TROPONINI 1.44* 1.39* 1.86*      Recent Labs Lab 04/27/17 1411  TROPIPOC 0.13*     BNP  Recent Labs Lab 04/27/17 1352  BNP 1,193.6*     DDimer No results for input(s): DDIMER in the last 168 hours.   Radiology    No results found.  Cardiac Studies   Echo  04/28/17   Study Conclusions - Left ventricle: The cavity size was normal. Wall thickness was   increased in a pattern of moderate LVH. Systolic function was   mildly to moderately reduced. The estimated ejection fraction was   40%. Diffuse hypokinesis. Doppler parameters are consistent with   restrictive physiology, indicative of decreased left ventricular   diastolic compliance and/or increased left atrial pressure.   Doppler parameters are consistent with high ventricular filling   pressure. - Regional wall motion abnormality: Hypokinesis of the basal   inferoseptal myocardium; moderate hypokinesis of the apical   anterior, apical septal, apical lateral, and apical myocardium. 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 No significant bioprosthetic valvular   stenosis. There was no regurgitation. Peak velocity (S): 290   cm/s. Mean gradient (S): 15 mm Hg. Valve area (VTI): 2.16 cm^2.   Valve area (Vmax): 2.08 cm^2.  Valve area (Vmean): 2.15 cm^2. - Mitral valve: Calcified annulus. Mildly thickened leaflets .   There was moderate regurgitation. There is some acoustic   shadowing from mitral annular calcification. - Left atrium: The atrium was severely dilated. - Right ventricle: Pacer wire or catheter noted in right ventricle. - Right atrium: The atrium was mildly dilated. - Tricuspid valve: There was mild regurgitation. - Pulmonic valve: There was mild regurgitation.  Impressions: - I compared today&'s study to the study dated 07/19/16. LVEF appears   slightly better in some views and worse in others, but appears to   be in the 40% range. The apex, apical septum, anteroapical wall,   and  anterolateral walls may be slightly more hypokinetic than   before.   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  Assessment & Plan    CAD:  -Pt admitted with chest tightness and significant DOE with minimal activity -known total RCA with collaterals  -elevated troponin  -R&L heart cath done  05/01/17 showed mild elevation of right heart pressures with mild pulmonary hypertension, well-seated TAVR valve, Multi-vessel CAD with a calcified proximal to mid LAD with 60-70% proximal LAD stenosis, which has slightly progressed from previously, 30-40% mid AV groove circumflex stenoses with 30% stenosis in the distal marginal branch; and previously noted old total occlusion of the mid RCA with left-to-right collaterals to the PDA vessel. Recommendation os for increased medical therapy and if pt develops recurrent symptomatolgy, consider PCI with atherectomy to the prox LAD lesion.   -Atorvastatin has been increased to 80 mg. Isordil added, metoprolol increased to 25 mg bid, on aspirin 81 mg (amlodipine was stopped to make room in BP for heart meds) -Chest pain has resolved and breathing is better since admission with dialysis, med changes and breathing treatments.   Chronic systolic heart failure -EF 40% on echo -Fluid status controlled with dialysis. No appears euvolemic.  S/p TAVR:   -SEM on exam, echo shows no perivalvular leak, gradient 15 mmHg -Heart cath showed well-seated TAVR valve  CRF   -D/C home after dialysis today  Will arrange outpatient f/u with Dr Marlou Porch  Jenkins Rouge, MD

## 2017-05-08 ENCOUNTER — Encounter: Payer: Self-pay | Admitting: Cardiology

## 2017-05-10 ENCOUNTER — Other Ambulatory Visit: Payer: Self-pay

## 2017-05-14 NOTE — Progress Notes (Signed)
Cardiology Office Note    Date:  05/15/2017   ID:  Jeffrey [name], DOB 1941-03-20, MRN [id number]  PCP:  Thurman Coyer, MD  Cardiologist:  Dr. Marlou Porch / Dr Burt Knack (TAVR) / Dr. Caryl Comes (EP)  CC: post hospital follow up  History of Present Illness:  Jeffrey [name] is a 76 y.o. 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   He was seen by Dr. Caryl Comes on 01/31/17. 14 hours of atrial fibrillation was noted on his device. There was some question about starting [place] given elevated CHADSVASC of 5 (CHF, HTN, age, DMT2, vasc dz) but he wanted to clear it with Dr. Lorrene Reid first. It does not appear this ever happened.   Recently admitted 6/2-05/03/17 for chest pain and generalized weakness. His troponin peaked at 1.86. 2D ECHO showed EF 40% with diffuse HK and WMA. R&L heart cath 05/01/17 showed mild elevation of right heart pressures with mild pulmonary hypertension, well-seated TAVR valve, multi-vessel CAD with a calcified proximal to mid LAD with 60-70% proximal LAD stenosis, which has slightly progressed from previously, 30-40% mid AV groove circumflex stenoses with 30% stenosis in the distal marginal branch; and previously noted old total occlusion of the mid RCA with left-to-right collaterals to the PDA vessel. Recommendation was for increased medical therapy and if pt develops recurrent symptomatolgy, we could consider PCI with atherectomy to the prox LAD lesion. Atorvastatin was increased to 80 mg. Isordil was added and metoprolol increased to 25 mg bid (amlodipine was stopped to make room in BP).  He also had some volume overload that was treated with HD.   Today he presents to clinic for follow up. Since leaving the hospital he has been feeling much better in terms of his chest pain. Thinks Isordil is helping. His breathing has been okay. He sometimes has some trouble breathing at  night with orthopnea and PND but this has improved since wearing nocturnal 02. He has has some intermittent LE edema. No palpitations. No blood in stool or urine. Occasionally gets dizziness but no syncope.  He follows with Dr. Lorrene Reid with nephrology. He does HD M, W, Fri.    Past Medical History:  Diagnosis Date  . Anginal pain ([place])   . Aortic stenosis    a. severe by echo 09/2014  . Atrial fibrillation ([place])    a. not well documented, not on anticoagulation  . CHF (congestive heart failure) ([place])    04/28/17 echo-EF 40%, mod LVH, diastolic dysfunction  . Claustrophobia   . Complete heart block ([place])   . COPD (chronic obstructive pulmonary disease) ([place])   . Coronary artery disease    a. chronically occluded RCA per cath 09/2014 with collaterals B. cath 05/01/17 chr occ RCA w/collaterals, 60-70% mid LAD,   . CVA (cerebral vascular accident) ([place]) 10/2014   denies residual on 07/11/2015  . ESRD (end stage renal disease) on dialysis [facility])    a. on dialysis; [facility]; MWF, LUE fistula (07/11/2015)  . History of blood transfusion    "related to gallbladder OR"  . History of stomach ulcers   . Hyperlipidemia   . Hypertension   . Iron deficiency anemia   . Myocardial infarction ([place]) 10/2014  . Peripheral vascular disease ([place])   . Pneumonia   . Presence of permanent cardiac pacemaker   . Renal insufficiency   .  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 Type II diabetes mellitus ([place])     Past Surgical History:  Procedure Laterality Date  . AV FISTULA PLACEMENT Left 10/19/2014   Procedure: BRACHIOCEPHALIC ARTERIOVENOUS (AV) FISTULA CREATION ;  Surgeon: Conrad Lakeview, MD;  Location: [facility];  Service: Vascular;  Laterality: Left;  . CARDIAC CATHETERIZATION    . CARDIAC CATHETERIZATION N/A 07/22/2015   Procedure: Right/Left Heart Cath and Coronary Angiography;  Surgeon:  [name], MD;  Location: [facility] CV LAB;  Service: Cardiovascular;  Laterality: N/A;  . CATARACT EXTRACTION W/ INTRAOCULAR LENS  IMPLANT, BILATERAL Bilateral 1990's  . CHOLECYSTECTOMY OPEN  1980's  . COLONOSCOPY W/ BIOPSIES AND POLYPECTOMY    . CORONARY ANGIOPLASTY    . EP IMPLANTABLE DEVICE N/A 07/11/2015   Procedure: Pacemaker Implant;  Surgeon: [name], MD;  Location: [facility] CV LAB;  Service: Cardiovascular;  Laterality: N/A;  . ESOPHAGOGASTRODUODENOSCOPY  08/01/2012   Procedure: ESOPHAGOGASTRODUODENOSCOPY (EGD);  Surgeon: [name], MD;  Location: [facility];  Service: Endoscopy;  Laterality: N/A;  . INSERT / REPLACE / REMOVE PACEMAKER  07/11/2015  . INSERTION OF DIALYSIS CATHETER Right 02/02/2015   Procedure: INSERTION OF DIALYSIS CATHETER  RIGHT INTERNAL JUGULAR;  Surgeon: Mal [name], MD;  Location: [facility];  Service: Vascular;  Laterality: Right;  . LEFT AND RIGHT HEART CATHETERIZATION WITH CORONARY ANGIOGRAM N/A 09/30/2014   Procedure: LEFT AND RIGHT HEART CATHETERIZATION WITH CORONARY ANGIOGRAM;  Surgeon: [name], MD;  Location: [facility] C[facility] LAB;  Service: Cardiovascular;  Laterality: N/A;  . TEE WITHOUT CARDIOVERSION N/A 08/02/2015   Procedure: TRANSESOPHAGEAL ECHOCARDIOGRAM (TEE);  Surgeon: [name], MD;  Location: [facility];  Service: Open Heart Surgery;  Laterality: N/A;  . TONSILLECTOMY    . TRANSCATHETER AORTIC VALVE REPLACEMENT, TRANSFEMORAL Left 08/02/2015   Procedure: TRANSCATHETER AORTIC VALVE REPLACEMENT, TRANSFEMORAL;  Surgeon: [name], MD;  Location: [facility];  Service: Open Heart Surgery;  Laterality: Left;    Current Medications: Outpatient Medications Prior to Visit  Medication Sig Dispense Refill  . atorvastatin (LIPITOR) 40 MG tablet Take 2 tablets (80 mg total) by mouth daily. 1 tablet 0  . calcium carbonate (TUMS - DOSED IN MG ELEMENTAL CALCIUM) 500 MG chewable tablet Chew 1 tablet (200 mg of elemental calcium total) by  mouth 4 (four) times daily as needed for indigestion or heartburn. 120 tablet 0  . cinacalcet (SENSIPAR) 30 MG tablet Take 30 mg by mouth daily with supper.    . insulin detemir (LEVEMIR) 100 UNIT/ML injection Inject 0.48 mLs (48 Units total) into the skin at bedtime. 10 mL 0  . ipratropium-albuterol (DUONEB) 0.5-2.5 (3) MG/3ML SOLN Take 3 mLs by nebulization every 6 (six) hours. 360 mL 6  . lidocaine-prilocaine (EMLA) cream Apply 1 application topically once as needed (prior to accessing port).     . metoprolol tartrate (LOPRESSOR) 25 MG tablet Take 1 tablet (25 mg total) by mouth 2 (two) times daily. 60 tablet 0  . mometasone-formoterol (DULERA) 200-5 MCG/ACT AERO Inhale 2 puffs into the lungs 2 (two) times daily. 1 Inhaler 0  . nitroGLYCERIN (NITROSTAT) 0.4 MG SL tablet Place 0.4 mg under the tongue every 5 (five) minutes as needed for chest pain.     . Nutritional Supplements (FEEDING SUPPLEMENT, NEPRO CARB STEADY,) LIQD Take 237 mLs by mouth 2 (two) times daily between meals. 60 Can  0  . ranitidine (ZANTAC) 150 MG tablet Take 150 mg by mouth 2 (two) times daily as needed for heartburn.    . sevelamer carbonate (RENVELA) 800 MG tablet Take 4 tablets (3,200 mg total) by mouth 3 (three) times daily with meals. 120 tablet 0  . aspirin EC 325 MG tablet Take 1 tablet (325 mg total) by mouth daily. 1 tablet 0  . isosorbide dinitrate (ISORDIL) 20 MG tablet Take 1 tablet (20 mg total) by mouth 3 (three) times daily. 90 tablet 0  . albuterol (PROVENTIL HFA;VENTOLIN HFA) 108 (90 Base) MCG/ACT inhaler Inhale into the lungs.    . sevelamer carbonate (RENVELA) 800 MG tablet Take 2 tablets (1,600 mg total) by mouth 2 (two) times daily at 10 am and 4 pm. 120 tablet 0   No facility-administered medications prior to visit.      Allergies:   [place] 10 mcg pen [exenatide] and Codeine   Social History   Social History  . Marital status: Married    Spouse name: N/A  . Number of children: N/A  . Years of  education: N/A   Social History Main Topics  . Smoking status: Former Smoker    Packs/day: 2.00    Years: 32.00    Quit date: 11/27/1983  . Smokeless tobacco: Never Used  . Alcohol use Yes     Comment: occ  . Drug use: No  . Sexual activity: Yes    Birth control/ protection: None   Other Topics Concern  . None   Social History Narrative  . None     Family History:  The patient's family history includes Alzheimer's disease in his mother; Diabetes in his father and sister; Heart disease in his father.      ROS:   Please see the history of present illness.    ROS All other systems reviewed and are negative.   PHYSICAL EXAM:   VS:  BP (!) 132/40   Pulse 74   Ht 5\' 11"  (1.803 m)   Wt 226 lb (102.5 kg)   SpO2 (!) 87%   BMI 31.52 kg/m    GEN: Well nourished, well developed, in no acute distress, obese, chronically ill appearing white mail HEENT: normal  Neck: no JVD, carotid bruits, or masses Cardiac: regular; no murmurs, rubs, or gallops, 1+ bilateral LE edema  Respiratory:  clear to auscultation bilaterally, normal work of breathing GI: soft, nontender, nondistended, + BS MS: no deformity or atrophy  Skin: warm and dry, no rash Neuro:  Alert and Oriented x 3, Strength and sensation are intact Psych: euthymic mood, full affect    Wt Readings from Last 3 Encounters:  05/15/17 226 lb (102.5 kg)  05/03/17 219 lb 9.3 oz (99.6 kg)  04/16/17 230 lb (104.3 kg)      Studies/Labs Reviewed:   EKG:  EKG is NOT ordered today.    Recent Labs: 04/27/2017: ALT 12; B Natriuretic Peptide 1,193.6 04/29/2017: Magnesium 2.0 05/03/2017: BUN 61; Creatinine, Ser 4.96; Hemoglobin 8.5; Platelets 185; Potassium 4.2; Sodium 131   Lipid Panel    Component Value Date/Time   CHOL 95 04/30/2017 1412   TRIG 51 04/30/2017 1412   HDL 55 04/30/2017 1412   CHOLHDL 1.7 04/30/2017 1412   VLDL 10 04/30/2017 1412   LDLCALC 30 04/30/2017 1412    Additional studies/ records that were reviewed  today include:  Echo  04/28/17   Study Conclusions - Left ventricle: The cavity size was normal. Wall thickness was increased in  a pattern of moderate LVH. Systolic function was mildly to moderately reduced. The estimated ejection fraction was 40%. Diffuse hypokinesis. Doppler parameters are consistent with restrictive physiology, indicative of decreased left ventricular diastolic compliance and/or increased left atrial pressure. Doppler parameters are consistent with high ventricular filling pressure. - Regional wall motion abnormality: Hypokinesis of the basal inferoseptal myocardium; moderate hypokinesis of the apical anterior, apical septal, apical lateral, and apical myocardium. - Aortic valve: Bioprosthetic aortic valve s/p transcatheter aortic valve replacement. No significant bioprosthetic valvular stenosis. There was no regurgitation. Peak velocity (S): 290 cm/s. Mean gradient (S): 15 mm Hg. Valve area (VTI): 2.16 cm^2. Valve area (Vmax): 2.08 cm^2. Valve area (Vmean): 2.15 cm^2. - Mitral valve: Calcified annulus. Mildly thickened leaflets . There was moderate regurgitation. There is some acoustic shadowing from mitral annular calcification. - Left atrium: The atrium was severely dilated. - Right ventricle: Pacer wire or catheter noted in right ventricle. - Right atrium: The atrium was mildly dilated. - Tricuspid valve: There was mild regurgitation. - Pulmonic valve: There was mild regurgitation. Impressions: - I compared today&'s study to the study dated 07/19/16. LVEF appears slightly better in some views and worse in others, but appears to be in the 40% range. The apex, apical septum, anteroapical wall, and anterolateral walls may be slightly more hypokinetic than before.  05/01/17 Right Heart Cath and Coronary Angiography  Conclusion     Prox LAD lesion, 65 %stenosed.  Prox Cx to Mid Cx lesion, 40 %stenosed.  3rd Mrg lesion,  30 %stenosed.  Mid RCA lesion, 100 %stenosed.  Hemodynamic findings consistent with mild pulmonary hypertension.   Mild elevation of right heart pressures with mild pulmonary hypertension.  Well-seated TAVR valve.  Multi-vessel CAD with a calcified proximal to mid LAD with 60-70% proximal LAD stenosis, which has slightly progressed from previously, 30-40% mid AV groove circumflex stenoses with 30% stenosis in the distal marginal branch; and previously noted old total occlusion of the mid RCA with left-to-right collaterals to the PDA vessel.  RECOMMENDATION: Recommend an initial increased medical therapy trial.  If patient develops recurrent symptomatology, consider possible PCI with atherectomy to the proximal LAD lesion.      ASSESSMENT & PLAN:   CAD: chest pain has improved on Isordil. If he has return of symptoms, we can consider PCI with atherectomy to the prox LAD lesion. Currently on ASA 325mg  daily. Will decrease this 81 mg daily, especially since I am starting coumadin. Continue statin and BB  Chronic systolic CHF: volume management per nephrology. Continue BB. No ACE/ARB given ESRD  Persistent atrial fibrillation: He was seen by Dr. Caryl Comes on 01/31/17. 14 hours of atrial fibrillation was noted on his device. There was some question about starting [place] given elevated CHADSVASC of 5 (CHF, HTN, age, DMT2, vasc dz) but he wanted to clear it with Dr. Lorrene Reid first. It does not appear this ever happened. I had device interrogated today which showed he is in persistent atrial fibrillation. He has never had any issues with bleeding in the past. I will start coumadin (not a DOAC candidate given ESRD on HD) and have him seen in the coumadin clinic next week.   AS s/p TAVR: echo on 04/28/17 showed stable valve replacement with Mean gradient (S): 15 mm Hg and Vmax 2.08 cm^2  CHB s/p PPM: followed by Dr. Caryl Comes.   ESRD on HD: M, W, Fri. Followed by Dr. Lorrene Reid, I will let her know I have started  coumadin.   HTN: BP well controlled  today  HLD: continue statin.   Medication Adjustments/Labs and Tests Ordered: Current medicines are reviewed at length with the patient today.  Concerns regarding medicines are outlined above.  Medication changes, Labs and Tests ordered today are listed in the Patient Instructions below. Patient Instructions  Medication Instructions:  Your physician has recommended you make the following change in your medication:  1.  START Coumadin 5 mg taking 1 tablet daily 2.  REDUCE the Aspirin to 81 mg taking 1 tablet daily  Labwork: None ordered  Testing/Procedures: None ordered  Follow-Up: Your physician recommends that you schedule a follow-up appointment in: [place] Your physician recommends that you schedule a follow-up appointment in: 3 MONTHS WITH DR. Marlou Porch   Any Other Special Instructions Will Be Listed Below (If Applicable).     If you need a refill on your cardiac medications before your next appointment, please call your pharmacy.   Vitamin K Foods and Warfarin Warfarin is a blood thinner (anticoagulant). Anticoagulant medicines help prevent the formation of blood clots. These medicines work by decreasing the activity of vitamin K, which promotes normal blood clotting. When you take warfarin, problems can occur from suddenly increasing or decreasing the amount of vitamin K that you eat from one day to the next. Problems may include:  Blood clots.  Bleeding.  What general guidelines do I need to follow? To avoid problems when taking warfarin:  Eat a balanced diet that includes: ? Fresh fruits and vegetables. ? Whole grains. ? Low-fat dairy products. ? Lean proteins, such as fish, eggs, and lean cuts of meat.  Keep your intake of vitamin K consistent from day to day. To do this: ? Avoid eating large amounts of vitamin K one day and low amounts of vitamin K the next day. ? If you take a  multivitamin that contains vitamin K, be sure to take it every day. ? Know which foods contain vitamin K. Use the lists below to understand serving sizes and the amount of vitamin K in one serving.  Avoid major changes in your diet. If you are going to change your diet, talk with your health care provider before making changes.  Work with a Financial planner (dietitian) to develop a meal plan that works best for you.  High vitamin K foods Foods that are high in vitamin K contain more than 100 mcg (micrograms) per serving. These include:  Broccoli (cooked) -  cup has 110 mcg.  Brussels sprouts (cooked) -  cup has 109 mcg.  Greens, beet (cooked) -  cup has 350 mcg.  Greens, collard (cooked) -  cup has 418 mcg.  Greens, turnip (cooked) -  cup has 265 mcg.  Green onions or scallions -  cup has 105 mcg.  Kale (fresh or frozen) -  cup has 531 mcg.  Parsley (raw) - 10 sprigs has 164 mcg.  Spinach (cooked) -  cup has 444 mcg.  Swiss chard (cooked) -  cup has 287 mcg.  Moderate vitamin K foods Foods that have a moderate amount of vitamin K contain 25-100 mcg per serving. These include:  Asparagus (cooked) - 5 spears have 38 mcg.  Black-eyed peas (dried) -  cup has 32 mcg.  Cabbage (cooked) -  cup has 37 mcg.  Kiwi fruit - 1 medium has 31 mcg.  Lettuce - 1 cup has 57-63 mcg.  Okra (frozen) -  cup has 44 mcg.  Prunes (dried) - 5 prunes have  25 mcg.  Watercress (raw) - 1 cup has 85 mcg.  Low vitamin K foods Foods low in vitamin K contain less than 25 mcg per serving. These include:  Artichoke - 1 medium has 18 mcg.  Avocado - 1 oz. has 6 mcg.  Blueberries -  cup has 14 mcg.  Cabbage (raw) -  cup has 21 mcg.  Carrots (cooked) -  cup has 11 mcg.  Cauliflower (raw) -  cup has 11 mcg.  Cucumber with peel (raw) -  cup has 9 mcg.  Grapes -  cup has 12 mcg.  Mango - 1 medium has 9 mcg.  Nuts - 1 oz. has 15 mcg.  Pear - 1 medium has 8  mcg.  Peas (cooked) -  cup has 19 mcg.  Pickles - 1 spear has 14 mcg.  Pumpkin seeds - 1 oz. has 13 mcg.  Sauerkraut (canned) -  cup has 16 mcg.  Soybeans (cooked) -  cup has 16 mcg.  Tomato (raw) - 1 medium has 10 mcg.  Tomato sauce -  cup has 17 mcg.  Vitamin K-free foods If a food contain less than 5 mcg per serving, it is considered to have no vitamin K. These foods include:  Bread and cereal products.  Cheese.  Eggs.  Fish and shellfish.  Meat and poultry.  Milk and dairy products.  Sunflower seeds.  Actual amounts of vitamin K in foods may be different depending on processing. Talk with your dietitian about what foods you can eat and what foods you should avoid. This information is not intended to replace advice given to you by your health care provider. Make sure you discuss any questions you have with your health care provider. Document Released: 09/09/2009 Document Revised: 06/03/2016 Document Reviewed: 02/15/2016 Elsevier Interactive Patient Education  2017 [place], Angelena Form, [place]  05/15/2017 3:33 PM    [facility] [address], [place], [place]  [address] Phone: [phone]; Fax: [phone]

## 2017-05-15 ENCOUNTER — Encounter (INDEPENDENT_AMBULATORY_CARE_PROVIDER_SITE_OTHER): Payer: Self-pay

## 2017-05-15 ENCOUNTER — Encounter: Payer: Self-pay | Admitting: Physician Assistant

## 2017-05-15 ENCOUNTER — Ambulatory Visit (INDEPENDENT_AMBULATORY_CARE_PROVIDER_SITE_OTHER): Payer: Medicare Other | Admitting: Physician Assistant

## 2017-05-15 ENCOUNTER — Other Ambulatory Visit: Payer: Self-pay | Admitting: Physician Assistant

## 2017-05-15 VITALS — BP 132/40 | HR 74 | Ht 71.0 in | Wt 226.0 lb

## 2017-05-15 DIAGNOSIS — I25110 Atherosclerotic heart disease of native coronary artery with unstable angina pectoris: Secondary | ICD-10-CM

## 2017-05-15 DIAGNOSIS — N186 End stage renal disease: Secondary | ICD-10-CM | POA: Diagnosis not present

## 2017-05-15 DIAGNOSIS — Z950 Presence of cardiac pacemaker: Secondary | ICD-10-CM

## 2017-05-15 DIAGNOSIS — I5022 Chronic systolic (congestive) heart failure: Secondary | ICD-10-CM

## 2017-05-15 DIAGNOSIS — E785 Hyperlipidemia, unspecified: Secondary | ICD-10-CM | POA: Diagnosis not present

## 2017-05-15 DIAGNOSIS — I10 Essential (primary) hypertension: Secondary | ICD-10-CM

## 2017-05-15 DIAGNOSIS — Z952 Presence of prosthetic heart valve: Secondary | ICD-10-CM

## 2017-05-15 DIAGNOSIS — Z992 Dependence on renal dialysis: Secondary | ICD-10-CM

## 2017-05-15 MED ORDER — ASPIRIN EC 81 MG PO TBEC: 81.0000 mg | DELAYED_RELEASE_TABLET | Freq: Every day | ORAL | 3 refills | Status: DC

## 2017-05-15 MED ORDER — WARFARIN SODIUM 5 MG PO TABS: 5.0000 mg | ORAL_TABLET | Freq: Every day | ORAL | 0 refills | Status: DC

## 2017-05-15 MED ORDER — ISOSORBIDE DINITRATE 20 MG PO TABS: 20.0000 mg | ORAL_TABLET | Freq: Three times a day (TID) | ORAL | 3 refills | Status: DC

## 2017-05-15 NOTE — Patient Instructions (Addendum)
Medication Instructions:  Your physician has recommended you make the following change in your medication:  1.  START Coumadin 5 mg taking 1 tablet daily 2.  REDUCE the Aspirin to 81 mg taking 1 tablet daily  Labwork: None ordered  Testing/Procedures: None ordered  Follow-Up: Your physician recommends that you schedule a follow-up appointment in: [place] Your physician recommends that you schedule a follow-up appointment in: 3 MONTHS WITH DR. Marlou Porch   Any Other Special Instructions Will Be Listed Below (If Applicable).     If you need a refill on your cardiac medications before your next appointment, please call your pharmacy.   Vitamin K Foods and Warfarin Warfarin is a blood thinner (anticoagulant). Anticoagulant medicines help prevent the formation of blood clots. These medicines work by decreasing the activity of vitamin K, which promotes normal blood clotting. When you take warfarin, problems can occur from suddenly increasing or decreasing the amount of vitamin K that you eat from one day to the next. Problems may include:  Blood clots.  Bleeding.  What general guidelines do I need to follow? To avoid problems when taking warfarin:  Eat a balanced diet that includes: ? Fresh fruits and vegetables. ? Whole grains. ? Low-fat dairy products. ? Lean proteins, such as fish, eggs, and lean cuts of meat.  Keep your intake of vitamin K consistent from day to day. To do this: ? Avoid eating large amounts of vitamin K one day and low amounts of vitamin K the next day. ? If you take a multivitamin that contains vitamin K, be sure to take it every day. ? Know which foods contain vitamin K. Use the lists below to understand serving sizes and the amount of vitamin K in one serving.  Avoid major changes in your diet. If you are going to change your diet, talk with your health care provider before making changes.  Work with a Administrator, Civil Service (dietitian) to develop a meal plan that works best for you.  High vitamin K foods Foods that are high in vitamin K contain more than 100 mcg (micrograms) per serving. These include:  Broccoli (cooked) -  cup has 110 mcg.  Brussels sprouts (cooked) -  cup has 109 mcg.  Greens, beet (cooked) -  cup has 350 mcg.  Greens, collard (cooked) -  cup has 418 mcg.  Greens, turnip (cooked) -  cup has 265 mcg.  Green onions or scallions -  cup has 105 mcg.  Kale (fresh or frozen) -  cup has 531 mcg.  Parsley (raw) - 10 sprigs has 164 mcg.  Spinach (cooked) -  cup has 444 mcg.  Swiss chard (cooked) -  cup has 287 mcg.  Moderate vitamin K foods Foods that have a moderate amount of vitamin K contain 25-100 mcg per serving. These include:  Asparagus (cooked) - 5 spears have 38 mcg.  Black-eyed peas (dried) -  cup has 32 mcg.  Cabbage (cooked) -  cup has 37 mcg.  Kiwi fruit - 1 medium has 31 mcg.  Lettuce - 1 cup has 57-63 mcg.  Okra (frozen) -  cup has 44 mcg.  Prunes (dried) - 5 prunes have 25 mcg.  Watercress (raw) - 1 cup has 85 mcg.  Low vitamin K foods Foods low in vitamin K contain less than 25 mcg per serving. These include:  Artichoke - 1 medium has 18 mcg.  Avocado - 1 oz. has 6 mcg.  Blueberries -  cup has 14 mcg.  Cabbage (raw) -  cup has 21 mcg.  Carrots (cooked) -  cup has 11 mcg.  Cauliflower (raw) -  cup has 11 mcg.  Cucumber with peel (raw) -  cup has 9 mcg.  Grapes -  cup has 12 mcg.  Mango - 1 medium has 9 mcg.  Nuts - 1 oz. has 15 mcg.  Pear - 1 medium has 8 mcg.  Peas (cooked) -  cup has 19 mcg.  Pickles - 1 spear has 14 mcg.  Pumpkin seeds - 1 oz. has 13 mcg.  Sauerkraut (canned) -  cup has 16 mcg.  Soybeans (cooked) -  cup has 16 mcg.  Tomato (raw) - 1 medium has 10 mcg.  Tomato sauce -  cup has 17 mcg.  Vitamin K-free foods If a food contain less than 5 mcg per serving, it is considered to have  no vitamin K. These foods include:  Bread and cereal products.  Cheese.  Eggs.  Fish and shellfish.  Meat and poultry.  Milk and dairy products.  Sunflower seeds.  Actual amounts of vitamin K in foods may be different depending on processing. Talk with your dietitian about what foods you can eat and what foods you should avoid. This information is not intended to replace advice given to you by your health care provider. Make sure you discuss any questions you have with your health care provider. Document Released: 09/09/2009 Document Revised: 06/03/2016 Document Reviewed: 02/15/2016 Elsevier Interactive Patient Education  2017 [employer].

## 2017-05-21 ENCOUNTER — Ambulatory Visit (INDEPENDENT_AMBULATORY_CARE_PROVIDER_SITE_OTHER): Payer: Medicare Other | Admitting: Pharmacist

## 2017-05-21 ENCOUNTER — Ambulatory Visit: Payer: Medicare Other

## 2017-05-21 DIAGNOSIS — I481 Persistent atrial fibrillation: Secondary | ICD-10-CM

## 2017-05-21 DIAGNOSIS — I4819 Other persistent atrial fibrillation: Secondary | ICD-10-CM

## 2017-05-21 DIAGNOSIS — Z5181 Encounter for therapeutic drug level monitoring: Secondary | ICD-10-CM

## 2017-05-21 DIAGNOSIS — I4891 Unspecified atrial fibrillation: Secondary | ICD-10-CM | POA: Insufficient documentation

## 2017-05-21 DIAGNOSIS — Z952 Presence of prosthetic heart valve: Secondary | ICD-10-CM

## 2017-05-21 LAB — POCT INR: INR: 1.3

## 2017-05-28 ENCOUNTER — Ambulatory Visit (INDEPENDENT_AMBULATORY_CARE_PROVIDER_SITE_OTHER): Payer: Medicare Other | Admitting: *Deleted

## 2017-05-28 DIAGNOSIS — Z5181 Encounter for therapeutic drug level monitoring: Secondary | ICD-10-CM

## 2017-05-28 DIAGNOSIS — I481 Persistent atrial fibrillation: Secondary | ICD-10-CM

## 2017-05-28 DIAGNOSIS — Z952 Presence of prosthetic heart valve: Secondary | ICD-10-CM

## 2017-05-28 DIAGNOSIS — I4819 Other persistent atrial fibrillation: Secondary | ICD-10-CM

## 2017-05-28 LAB — CUP PACEART REMOTE DEVICE CHECK
Brady Statistic AP VP Percent: 55 %
Brady Statistic AS VP Percent: 44 %
Brady Statistic RA Percent Paced: 55 %
Brady Statistic RV Percent Paced: 100 %
Implantable Lead Implant Date: 20160815
Implantable Lead Implant Date: 20160815
Implantable Lead Location: 753859
Implantable Lead Model: 5076
Implantable Lead Model: 5076
Implantable Pulse Generator Implant Date: 20160815
Lead Channel Impedance Value: 416 Ohm
Lead Channel Pacing Threshold Amplitude: 0.9 V
Lead Channel Pacing Threshold Amplitude: 1.2 V
Lead Channel Pacing Threshold Pulse Width: 0.4 ms
Lead Channel Setting Pacing Amplitude: 2 V
Lead Channel Setting Pacing Amplitude: 2 V
MDC IDC LEAD LOCATION: 753860
MDC IDC MSMT LEADCHNL RA PACING THRESHOLD PULSEWIDTH: 0.4 ms
MDC IDC MSMT LEADCHNL RV IMPEDANCE VALUE: 406 Ohm
MDC IDC PG SERIAL: 68600861
MDC IDC SESS DTM: 20180703063007
MDC IDC SET LEADCHNL RV PACING PULSEWIDTH: 0.4 ms
MDC IDC SET LEADCHNL RV SENSING SENSITIVITY: 2 mV
MDC IDC STAT BRADY AP VS PERCENT: 0 %
MDC IDC STAT BRADY AS VS PERCENT: 0 %

## 2017-05-28 LAB — POCT INR: INR: 2.1

## 2017-05-28 MED ORDER — WARFARIN SODIUM 5 MG PO TABS: ORAL_TABLET | ORAL | 1 refills | Status: DC

## 2017-06-04 ENCOUNTER — Ambulatory Visit (INDEPENDENT_AMBULATORY_CARE_PROVIDER_SITE_OTHER): Payer: Medicare Other | Admitting: *Deleted

## 2017-06-04 DIAGNOSIS — I481 Persistent atrial fibrillation: Secondary | ICD-10-CM

## 2017-06-04 DIAGNOSIS — Z5181 Encounter for therapeutic drug level monitoring: Secondary | ICD-10-CM | POA: Diagnosis not present

## 2017-06-04 DIAGNOSIS — Z952 Presence of prosthetic heart valve: Secondary | ICD-10-CM | POA: Diagnosis not present

## 2017-06-04 DIAGNOSIS — I4819 Other persistent atrial fibrillation: Secondary | ICD-10-CM

## 2017-06-04 LAB — POCT INR: INR: 2.4

## 2017-06-11 ENCOUNTER — Ambulatory Visit (INDEPENDENT_AMBULATORY_CARE_PROVIDER_SITE_OTHER): Payer: Medicare Other | Admitting: *Deleted

## 2017-06-11 DIAGNOSIS — I481 Persistent atrial fibrillation: Secondary | ICD-10-CM | POA: Diagnosis not present

## 2017-06-11 DIAGNOSIS — Z5181 Encounter for therapeutic drug level monitoring: Secondary | ICD-10-CM

## 2017-06-11 DIAGNOSIS — Z952 Presence of prosthetic heart valve: Secondary | ICD-10-CM | POA: Diagnosis not present

## 2017-06-11 DIAGNOSIS — I4819 Other persistent atrial fibrillation: Secondary | ICD-10-CM

## 2017-06-11 LAB — POCT INR: INR: 2.5

## 2017-06-13 ENCOUNTER — Telehealth: Payer: Self-pay | Admitting: Pharmacist

## 2017-06-13 NOTE — Telephone Encounter (Signed)
Pt previously in office reporting itching on chest and rash. He reports this started with new medications (warfarin and isosorbide). Request sent to Dr. Marlou Porch and Dr. Caryl Comes (pt's cardiologists) about change/stop isosorbide as trial to help.   Per Dr. Marlou Porch and Dr. Caryl Comes both ok with d/c isosorbide to see if helps with itching/rash. If this does not help will consider other options.   Pt states understanding and appreciation. He will update [place] on itching at next INR check.

## 2017-06-17 ENCOUNTER — Emergency Department (HOSPITAL_COMMUNITY): Payer: Medicare Other

## 2017-06-17 ENCOUNTER — Emergency Department (HOSPITAL_COMMUNITY)
Admission: EM | Admit: 2017-06-17 | Discharge: 2017-06-17 | Disposition: A | Discharge status: Home / Self Care | Payer: Medicare Other | Attending: Emergency Medicine | Admitting: Emergency Medicine

## 2017-06-17 DIAGNOSIS — I5032 Chronic diastolic (congestive) heart failure: Secondary | ICD-10-CM | POA: Diagnosis not present

## 2017-06-17 DIAGNOSIS — Z7901 Long term (current) use of anticoagulants: Secondary | ICD-10-CM | POA: Insufficient documentation

## 2017-06-17 DIAGNOSIS — I110 Hypertensive heart disease with heart failure: Secondary | ICD-10-CM | POA: Insufficient documentation

## 2017-06-17 DIAGNOSIS — Z7982 Long term (current) use of aspirin: Secondary | ICD-10-CM | POA: Insufficient documentation

## 2017-06-17 DIAGNOSIS — Z794 Long term (current) use of insulin: Secondary | ICD-10-CM | POA: Diagnosis not present

## 2017-06-17 DIAGNOSIS — Z992 Dependence on renal dialysis: Secondary | ICD-10-CM | POA: Insufficient documentation

## 2017-06-17 DIAGNOSIS — R109 Unspecified abdominal pain: Secondary | ICD-10-CM

## 2017-06-17 DIAGNOSIS — J449 Chronic obstructive pulmonary disease, unspecified: Secondary | ICD-10-CM | POA: Insufficient documentation

## 2017-06-17 DIAGNOSIS — Z8673 Personal history of transient ischemic attack (TIA), and cerebral infarction without residual deficits: Secondary | ICD-10-CM | POA: Diagnosis not present

## 2017-06-17 DIAGNOSIS — I252 Old myocardial infarction: Secondary | ICD-10-CM | POA: Diagnosis not present

## 2017-06-17 DIAGNOSIS — N3000 Acute cystitis without hematuria: Secondary | ICD-10-CM | POA: Diagnosis not present

## 2017-06-17 DIAGNOSIS — R1084 Generalized abdominal pain: Secondary | ICD-10-CM | POA: Diagnosis not present

## 2017-06-17 DIAGNOSIS — Z87891 Personal history of nicotine dependence: Secondary | ICD-10-CM | POA: Diagnosis not present

## 2017-06-17 DIAGNOSIS — N186 End stage renal disease: Secondary | ICD-10-CM | POA: Diagnosis not present

## 2017-06-17 DIAGNOSIS — E119 Type 2 diabetes mellitus without complications: Secondary | ICD-10-CM | POA: Diagnosis not present

## 2017-06-17 DIAGNOSIS — I4891 Unspecified atrial fibrillation: Secondary | ICD-10-CM | POA: Diagnosis not present

## 2017-06-17 DIAGNOSIS — I120 Hypertensive chronic kidney disease with stage 5 chronic kidney disease or end stage renal disease: Secondary | ICD-10-CM | POA: Diagnosis not present

## 2017-06-17 DIAGNOSIS — Z79899 Other long term (current) drug therapy: Secondary | ICD-10-CM | POA: Insufficient documentation

## 2017-06-17 DIAGNOSIS — I2510 Atherosclerotic heart disease of native coronary artery without angina pectoris: Secondary | ICD-10-CM | POA: Insufficient documentation

## 2017-06-17 LAB — COMPREHENSIVE METABOLIC PANEL
ALK PHOS: 76 U/L (ref 38–126)
ALT: 8 U/L — ABNORMAL LOW (ref 17–63)
ANION GAP: 16 — AB (ref 5–15)
AST: 14 U/L — AB (ref 15–41)
Albumin: 3.4 g/dL — ABNORMAL LOW (ref 3.5–5.0)
BILIRUBIN TOTAL: 0.6 mg/dL (ref 0.3–1.2)
BUN: 74 mg/dL — AB (ref 6–20)
CALCIUM: 7.6 mg/dL — AB (ref 8.9–10.3)
CO2: 22 mmol/L (ref 22–32)
Chloride: 96 mmol/L — ABNORMAL LOW (ref 101–111)
Creatinine, Ser: 6.91 mg/dL — ABNORMAL HIGH (ref 0.61–1.24)
GFR calc Af Amer: 8 mL/min — ABNORMAL LOW (ref >60)
GFR, EST NON AFRICAN AMERICAN: 7 mL/min — AB (ref >60)
Glucose, Bld: 172 mg/dL — ABNORMAL HIGH (ref 65–99)
POTASSIUM: 4.9 mmol/L (ref 3.5–5.1)
Sodium: 134 mmol/L — ABNORMAL LOW (ref 135–145)
Total Protein: 5.9 g/dL — ABNORMAL LOW (ref 6.5–8.1)

## 2017-06-17 LAB — URINALYSIS, ROUTINE W REFLEX MICROSCOPIC
BILIRUBIN URINE: NEGATIVE
Glucose, UA: >=500 mg/dL — AB
Ketones, ur: NEGATIVE mg/dL
NITRITE: NEGATIVE
PH: 6 (ref 5.0–8.0)
Protein, ur: 100 mg/dL — AB
SPECIFIC GRAVITY, URINE: 1.01 (ref 1.005–1.030)

## 2017-06-17 LAB — LIPASE, BLOOD: Lipase: 46 U/L (ref 11–51)

## 2017-06-17 LAB — CBC
HEMATOCRIT: 37.2 % — AB (ref 39.0–52.0)
Hemoglobin: 12.4 g/dL — ABNORMAL LOW (ref 13.0–17.0)
MCH: 30.2 pg (ref 26.0–34.0)
MCHC: 33.3 g/dL (ref 30.0–36.0)
MCV: 90.7 fL (ref 78.0–100.0)
Platelets: 190 10*3/uL (ref 150–400)
RBC: 4.1 MIL/uL — ABNORMAL LOW (ref 4.22–5.81)
RDW: 16.2 % — AB (ref 11.5–15.5)
WBC: 9.8 10*3/uL (ref 4.0–10.5)

## 2017-06-17 LAB — PROTIME-INR
INR: 2.75
PROTHROMBIN TIME: 29.6 s — AB (ref 11.4–15.2)

## 2017-06-17 MED ORDER — CEPHALEXIN 500 MG PO CAPS: 500.0000 mg | ORAL_CAPSULE | Freq: Three times a day (TID) | ORAL | 0 refills | Status: DC

## 2017-06-17 MED ORDER — CEFTRIAXONE SODIUM 1 G IJ SOLR
1.0000 g | Freq: Once | INTRAMUSCULAR | Status: AC
  Administered 2017-06-17: 1 g via INTRAVENOUS
  Filled 2017-06-17: qty 10

## 2017-06-17 MED ORDER — ONDANSETRON 4 MG PO TBDP: 4.0000 mg | ORAL_TABLET | Freq: Three times a day (TID) | ORAL | 0 refills | Status: DC | PRN

## 2017-06-17 MED ORDER — IOPAMIDOL (ISOVUE-300) INJECTION 61%
INTRAVENOUS | Status: AC
  Administered 2017-06-17: 100 mL
  Filled 2017-06-17: qty 100

## 2017-06-17 NOTE — Discharge Instructions (Signed)
You may take over-the-counter Tylenol 1000 mg every 6 hours as needed for pain. 

## 2017-06-17 NOTE — ED Notes (Signed)
Pt presents to ED for assessment of right sided flank and abdominal pain, starting two days ago.  Pt c/o constant nausea, denies vomiting.  Denies changes in bowels.  C/o difficulty starting urination, and little urine coming out when he is able to go.  Pt sts hx of kidney stones.  Also still has appendix.

## 2017-06-17 NOTE — ED Notes (Signed)
Pt reports he has been having abd pains since Friday on the right lower abd area. Pt states he is unable to get any relief.

## 2017-06-17 NOTE — ED Provider Notes (Signed)
TIME SEEN: 3:35 AM  By signing my name below, I, Ny'Kea Lewis, attest that this documentation has been prepared under the direction and in the presence of Ward, Delice Bison, DO. Electronically Signed: Lise Auer, ED Scribe. 06/17/17. 3:46 AM.  CHIEF COMPLAINT: Abdominal Pain   HPI:  Jeffrey [name] is a 76 y.o. 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 He notes associated nausea and RLQ abdominal pain. He describes the pain as "soreness" now. States he is already feeling much better. Per pt's relative, he began to experience right flank pain two days ago that radiates into the right lower quadrant. He endorses a hx of nephrolithiasis, but denies the pain is similar. Pt is currently on dialysis M,W, and F and report he has not missed any treatments. Denies vomiting or diarrhea. He states he does still urinate and urinates 2-3 times a day. No dysuria, hematuria, urinary frequency or urgency. No penile discharge. Has been on dialysis for over 2 years.  He is status post cholecystectomy.  ROS: See HPI Constitutional: no fever  Eyes: no drainage  ENT: no runny nose   Cardiovascular:  no chest pain  Resp: no SOB  GI: no vomiting or diarrhea, + nausea GU: no dysuria Integumentary: no rash  Allergy: no hives  Musculoskeletal: no leg swelling  Neurological: no slurred speech ROS otherwise negative  PAST MEDICAL HISTORY/PAST SURGICAL HISTORY:  Past Medical History:  Diagnosis Date  . Anginal pain ([place])   . Aortic stenosis    a. severe by echo 09/2014  . Atrial fibrillation ([place])    a. not well documented, not on anticoagulation  . CHF (congestive heart failure) ([place])    04/28/17 echo-EF 40%, mod LVH, diastolic dysfunction  . Claustrophobia   . Complete heart block ([place])   . COPD (chronic obstructive pulmonary  disease) ([place])   . Coronary artery disease    a. chronically occluded RCA per cath 09/2014 with collaterals B. cath 05/01/17 chr occ RCA w/collaterals, 60-70% mid LAD,   . CVA (cerebral vascular accident) ([place]) 10/2014   denies residual on 07/11/2015  . ESRD (end stage renal disease) on dialysis [facility])    a. on dialysis; [facility]; MWF, LUE fistula (07/11/2015)  . History of blood transfusion    "related to gallbladder OR"  . History of stomach ulcers   . Hyperlipidemia   . Hypertension   . Iron deficiency anemia   . Myocardial infarction ([place]) 10/2014  . Peripheral vascular disease ([place])   . Pneumonia   . Presence of permanent cardiac pacemaker   . Renal insufficiency   . 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 Type II diabetes mellitus ([facility])     MEDICATIONS:  Prior to Admission medications   Medication Sig Start Date End Date Taking? Authorizing Provider  albuterol (PROVENTIL HFA;VENTOLIN HFA) 108 (90 Base) MCG/ACT inhaler Inhale into the lungs. 03/26/17   [provider]  aspirin EC 81 MG tablet Take 1 tablet (81 mg total) by mouth daily. 05/15/17   Eileen Stanford, PA-C  atorvastatin (LIPITOR) 40 MG tablet Take 2 tablets (80 mg total) by mouth daily. 05/03/17   Allie Bossier, MD  cinacalcet [facility]) 30  MG tablet Take 30 mg by mouth daily with supper.    [provider]  insulin detemir (LEVEMIR) 100 UNIT/ML injection Inject 0.48 mLs (48 Units total) into the skin at bedtime. 05/03/17   Allie Bossier, MD  ipratropium-albuterol (DUONEB) 0.5-2.5 (3) MG/3ML SOLN Take 3 mLs by nebulization every 6 (six) hours. 04/02/17   Mannam, Hart Robinsons, MD  lidocaine-prilocaine (EMLA) cream Apply 1 application topically once as needed (prior to accessing port).     [provider]  metoprolol tartrate (LOPRESSOR) 25 MG tablet Take 1 tablet (25 mg total) by  mouth 2 (two) times daily. 05/03/17   Allie Bossier, MD  mometasone-formoterol [facility]) 200-5 MCG/ACT AERO Inhale 2 puffs into the lungs 2 (two) times daily. 05/03/17   Allie Bossier, MD  nitroGLYCERIN (NITROSTAT) 0.4 MG SL tablet Place 0.4 mg under the tongue every 5 (five) minutes as needed for chest pain.     [provider]  Nutritional Supplements (FEEDING SUPPLEMENT, NEPRO CARB STEADY,) LIQD Take 237 mLs by mouth 2 (two) times daily between meals. 05/04/17   Allie Bossier, MD  ranitidine (ZANTAC) 150 MG tablet Take 150 mg by mouth 2 (two) times daily as needed for heartburn.    [provider]  sevelamer carbonate (RENVELA) 800 MG tablet Take 4 tablets (3,200 mg total) by mouth 3 (three) times daily with meals. 05/03/17   Allie Bossier, MD  sevelamer carbonate (RENVELA) 800 MG tablet Take 2 tablets (1,600 mg total) by mouth 2 (two) times daily at 10 am and 4 pm. 05/04/17   Allie Bossier, MD  warfarin (COUMADIN) 5 MG tablet Take as directed by coumadin clinic 05/28/17   Jerline Pain, MD    ALLERGIES:  Allergies  Allergen Reactions  . Byetta 10 Mcg Pen [Exenatide] Diarrhea and Nausea And Vomiting  . Codeine Itching    SOCIAL HISTORY:  Social History  Substance Use Topics  . Smoking status: Former Smoker    Packs/day: 2.00    Years: 32.00    Quit date: 11/27/1983  . Smokeless tobacco: Never Used  . Alcohol use Yes     Comment: occ    FAMILY HISTORY: Family History  Problem Relation Age of Onset  . Diabetes Father   . Heart disease Father   . Diabetes Sister   . Alzheimer's disease Mother     EXAM: BP (!) 168/61 (BP Location: Right Arm)   Pulse 66   Temp 98 F (36.7 C) (Oral)   Resp 16   Ht 5\' 11"  (1.803 m)   Wt 210 lb (95.3 kg)   SpO2 96%   BMI 29.29 kg/m  CONSTITUTIONAL: Alert and oriented and responds appropriately to questions. Elderly, obese, in no distress, afebrile, smiling and laughing HEAD: Normocephalic EYES: Conjunctivae clear, pupils  appear equal, EOMI ENT: normal nose; moist mucous membranes NECK: Supple, no meningismus, no nuchal rigidity, no LAD  CARD: RRR; S1 and S2 appreciated; + murmur, no clicks, no rubs, no gallops RESP: Normal chest excursion without splinting or tachypnea; breath sounds clear and equal bilaterally; no wheezes, no rhonchi, no rales, no hypoxia or respiratory distress, speaking full sentences ABD/GI: Normal bowel sounds; non-distended; soft, very minimally tender throughout the right abdomen, no rebound, no guarding, no peritoneal signs, no hepatosplenomegaly BACK:  The back appears normal and is non-tender to palpation, there is no CVA tenderness EXT: Normal ROM in all joints; non-tender to palpation; no edema; normal capillary refill; no cyanosis, no calf tenderness  or swelling, fistula with good thrill lumbar region the left upper extremity with 2+ radial pulses bilaterally and no tenderness over this area, redness, warmth    SKIN: Normal color for age and race; warm; no rash NEURO: Moves all extremities equally PSYCH: The patient's mood and manner are appropriate. Grooming and personal hygiene are appropriate.  MEDICAL DECISION MAKING: Patient with complaints of several days of right-sided abdominal pain and nausea. Reports symptoms are slowly improving. States this feels different than his previous episodes of kidney stones. His daughter was concerned for appendicitis. Labs obtained in triage are unremarkable other than renal failure which is his baseline. Potassium is 4.9. No leukocytosis. We'll obtain CT scan for further evaluation. Will check INR. Urine also pending. He declines any pain or nausea medicine at this time. He has asked several times if he can go home.  ED PROGRESS: Patient's CT scan shows mild right hydronephrosis and perinephric stranding without any sign of nephrolithiasis or ureteral obstruction. His urine shows 6-30 red blood cells, 6-30 white blood cells and rare bacteria. I have  sent a urine culture. His INR is therapeutic. I have very low suspicion that this is truly pyelonephritis given he is so well-appearing. Discussed with family that he could've recently passed a kidney stone and that is why he is feeling better and what we see the mild hydronephrosis and perinephric stranding. We will give him a dose of IV ceftriaxone here and discharge him on Keflex in case there is any component of a urinary tract infection. Given he looks so well and has no current complaints and is hemodynamically stable, afebrile and nontoxic, I feel he is safe for discharge. He would prefer discharge home and again has asked several times during the course of his ED visit when he can go home because he is feeling much better. Have recommended close follow-up with his primary care provider. He is scheduled for dialysis at 6 AM today which unfortunately has missed but his daughter feels confident that she can call the dialysis center and get him dialysis later today. Discussed at length return precautions. Patient and family are comfortable with this plan.   At this time, I do not feel there is any life-threatening condition present. I have reviewed and discussed all results (EKG, imaging, lab, urine as appropriate) and exam findings with patient/family. I have reviewed nursing notes and appropriate previous records.  I feel the patient is safe to be discharged home without further emergent workup and can continue workup as an outpatient as needed. Discussed usual and customary return precautions. Patient/family verbalize understanding and are comfortable with this plan.  Outpatient follow-up has been provided if needed. All questions have been answered.  I personally performed the services described in this documentation, which was scribed in my presence. The recorded information has been reviewed and is accurate.     Ward, Delice Bison, DO 06/17/17 [phone]

## 2017-06-18 LAB — URINE CULTURE: Culture: NO GROWTH

## 2017-06-25 ENCOUNTER — Ambulatory Visit (INDEPENDENT_AMBULATORY_CARE_PROVIDER_SITE_OTHER): Payer: Medicare Other | Admitting: *Deleted

## 2017-06-25 DIAGNOSIS — I4819 Other persistent atrial fibrillation: Secondary | ICD-10-CM

## 2017-06-25 DIAGNOSIS — I481 Persistent atrial fibrillation: Secondary | ICD-10-CM

## 2017-06-25 DIAGNOSIS — Z952 Presence of prosthetic heart valve: Secondary | ICD-10-CM

## 2017-06-25 DIAGNOSIS — Z5181 Encounter for therapeutic drug level monitoring: Secondary | ICD-10-CM | POA: Diagnosis not present

## 2017-06-25 LAB — POCT INR: INR: 2.1

## 2017-06-27 ENCOUNTER — Ambulatory Visit: Payer: Medicare Other | Admitting: Pulmonary Disease

## 2017-07-16 ENCOUNTER — Ambulatory Visit (INDEPENDENT_AMBULATORY_CARE_PROVIDER_SITE_OTHER): Payer: Medicare Other | Admitting: Pharmacist

## 2017-07-16 DIAGNOSIS — I481 Persistent atrial fibrillation: Secondary | ICD-10-CM | POA: Diagnosis not present

## 2017-07-16 DIAGNOSIS — Z952 Presence of prosthetic heart valve: Secondary | ICD-10-CM | POA: Diagnosis not present

## 2017-07-16 DIAGNOSIS — Z5181 Encounter for therapeutic drug level monitoring: Secondary | ICD-10-CM

## 2017-07-16 DIAGNOSIS — I4819 Other persistent atrial fibrillation: Secondary | ICD-10-CM

## 2017-07-16 LAB — POCT INR: INR: 1.5

## 2017-07-16 MED ORDER — WARFARIN SODIUM 5 MG PO TABS: ORAL_TABLET | ORAL | 0 refills | Status: DC

## 2017-08-01 ENCOUNTER — Ambulatory Visit (INDEPENDENT_AMBULATORY_CARE_PROVIDER_SITE_OTHER): Payer: Medicare Other | Admitting: *Deleted

## 2017-08-01 DIAGNOSIS — I442 Atrioventricular block, complete: Secondary | ICD-10-CM

## 2017-08-02 NOTE — Progress Notes (Signed)
Remote pacemaker transmission.   

## 2017-08-06 ENCOUNTER — Encounter: Payer: Self-pay | Admitting: Cardiology

## 2017-08-08 ENCOUNTER — Ambulatory Visit (INDEPENDENT_AMBULATORY_CARE_PROVIDER_SITE_OTHER): Payer: Medicare Other | Admitting: Pharmacist

## 2017-08-08 DIAGNOSIS — Z5181 Encounter for therapeutic drug level monitoring: Secondary | ICD-10-CM

## 2017-08-08 DIAGNOSIS — I4891 Unspecified atrial fibrillation: Secondary | ICD-10-CM | POA: Diagnosis not present

## 2017-08-08 DIAGNOSIS — Z952 Presence of prosthetic heart valve: Secondary | ICD-10-CM | POA: Diagnosis not present

## 2017-08-08 LAB — POCT INR: INR: 1.6

## 2017-08-20 ENCOUNTER — Ambulatory Visit (INDEPENDENT_AMBULATORY_CARE_PROVIDER_SITE_OTHER): Payer: Medicare Other | Admitting: *Deleted

## 2017-08-20 DIAGNOSIS — Z952 Presence of prosthetic heart valve: Secondary | ICD-10-CM | POA: Diagnosis not present

## 2017-08-20 DIAGNOSIS — I4891 Unspecified atrial fibrillation: Secondary | ICD-10-CM

## 2017-08-20 DIAGNOSIS — Z5181 Encounter for therapeutic drug level monitoring: Secondary | ICD-10-CM

## 2017-08-20 LAB — POCT INR: INR: 1.9

## 2017-08-20 MED ORDER — WARFARIN SODIUM 5 MG PO TABS: ORAL_TABLET | ORAL | 0 refills | Status: DC

## 2017-08-23 IMAGING — DX DG CHEST 2V
2 series · 2 of 2 positions shown · non-contrast
Comparison: 03/22/2017

CLINICAL DATA: Shortness of Breath

EXAM:
CHEST  2 VIEW

[chest pa]
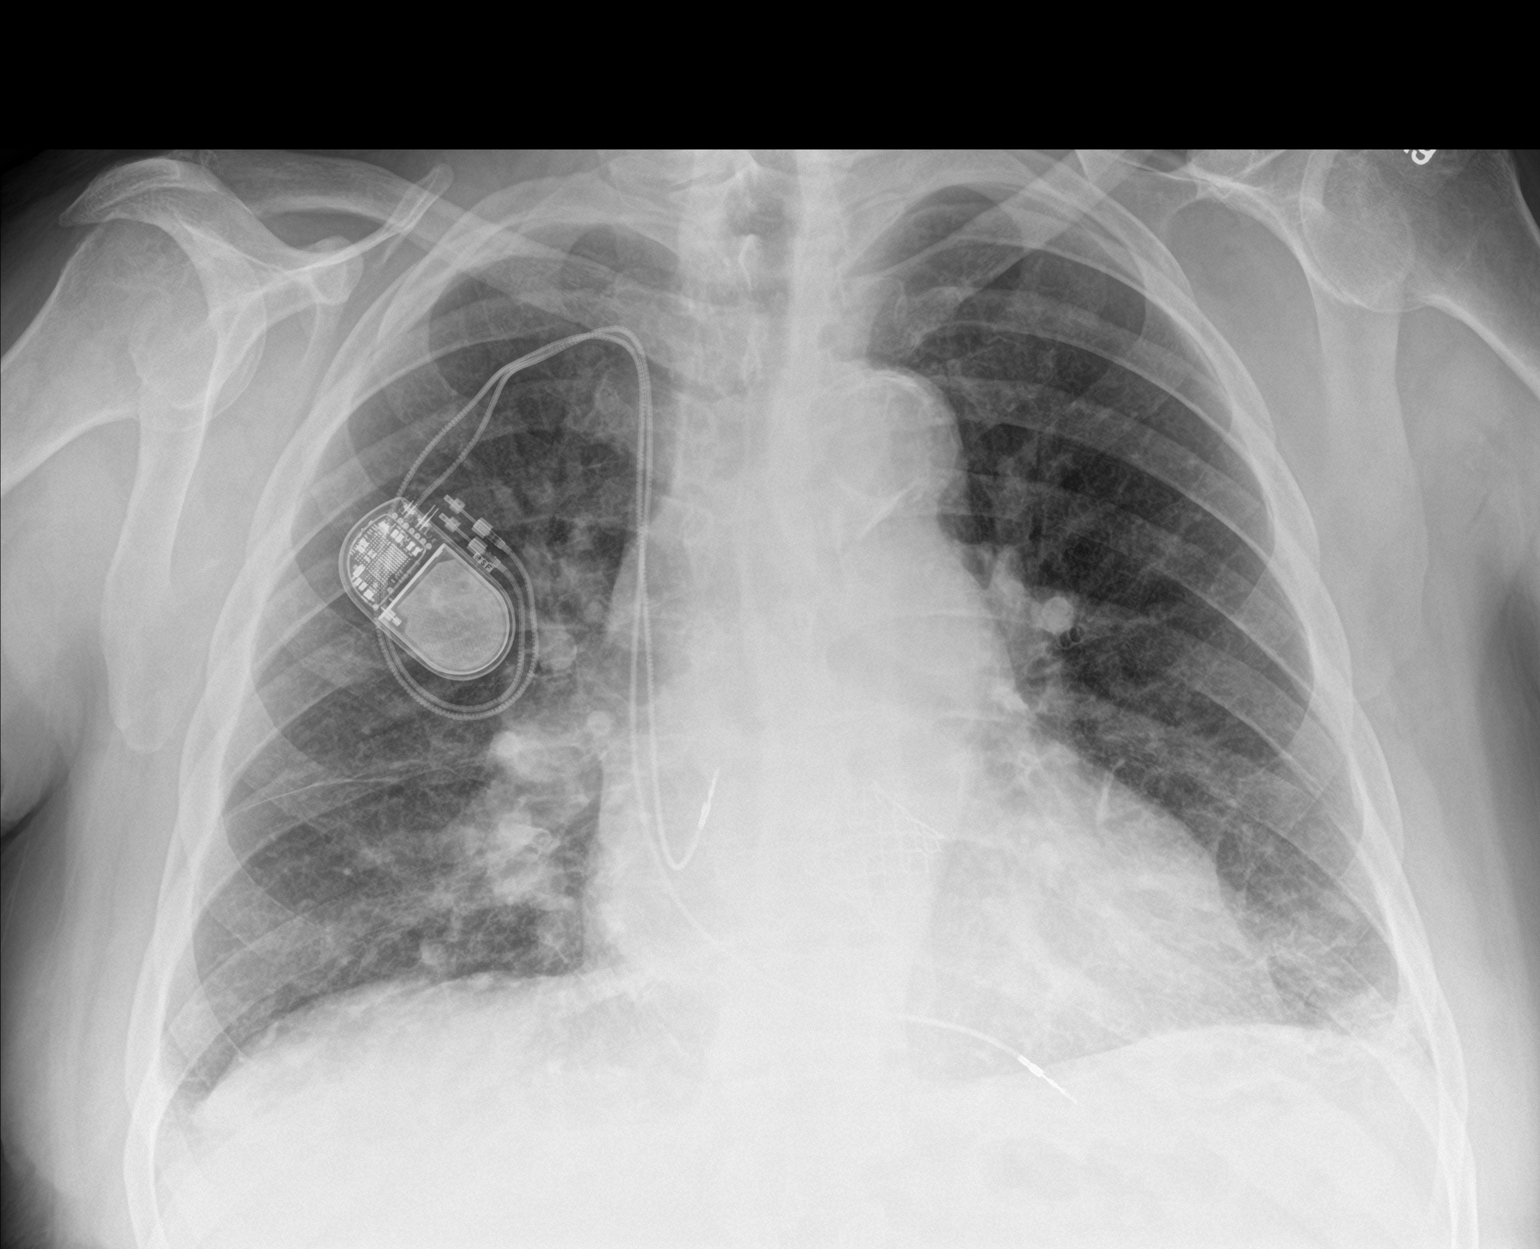

[chest lat]
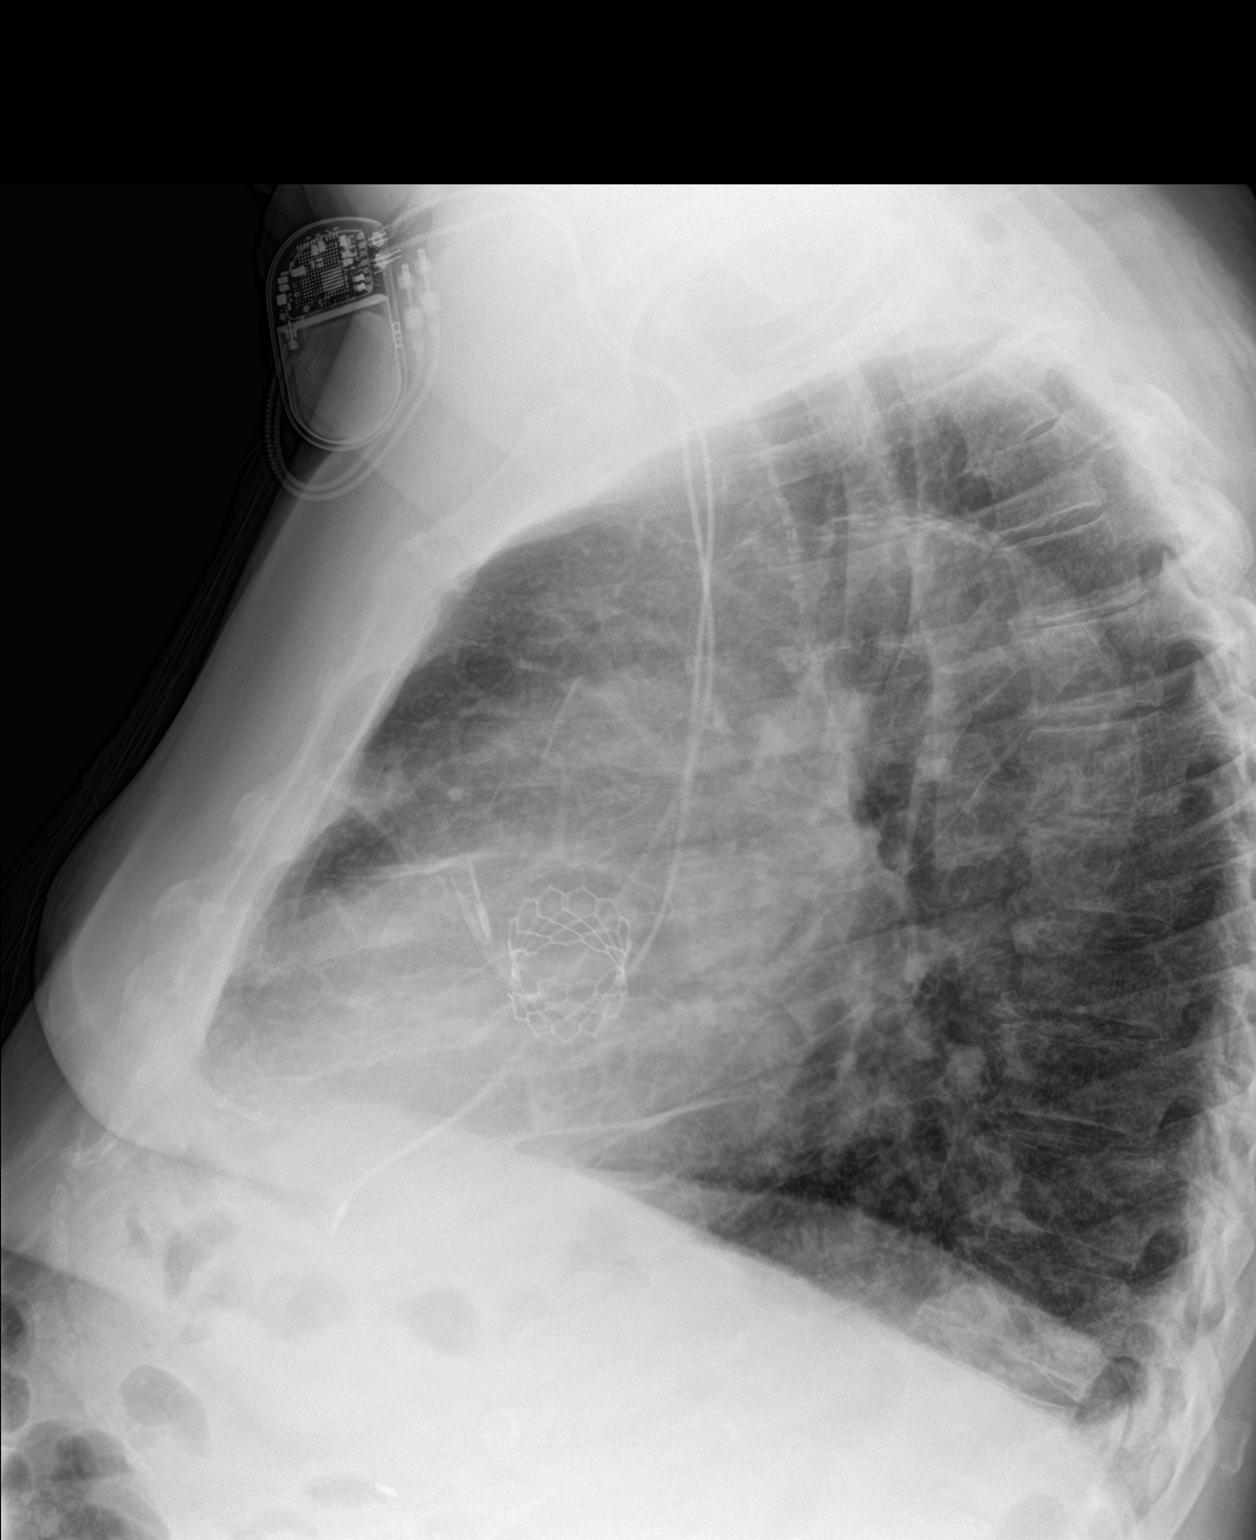

[2 of 2 positions shown; findings below may reference images not displayed]

FINDINGS: Cardiac shadow is mildly enlarged. Changes consistent withTAVR are
seen. Pacing device is again noted and stable. Aortic calcifications
are seen. The lungs are well aerated bilaterally. Some minimal
atelectatic changes are noted in the bases bilaterally. No bony
abnormality is seen.
IMPRESSION: Minimal bibasilar atelectasis.

## 2017-08-27 LAB — CUP PACEART REMOTE DEVICE CHECK
Brady Statistic AP VP Percent: 51 %
Brady Statistic AS VP Percent: 47 %
Brady Statistic AS VS Percent: 1 %
Date Time Interrogation Session: 20181002061746
Implantable Lead Implant Date: 20160815
Implantable Lead Location: 753860
Implantable Lead Model: 5076
Lead Channel Impedance Value: 415 Ohm
Lead Channel Impedance Value: 428 Ohm
Lead Channel Pacing Threshold Pulse Width: 0.4 ms
Lead Channel Setting Pacing Amplitude: 2 V
Lead Channel Setting Pacing Pulse Width: 0.4 ms
Lead Channel Setting Sensing Sensitivity: 2 mV
MDC IDC LEAD IMPLANT DT: 20160815
MDC IDC LEAD LOCATION: 753859
MDC IDC MSMT LEADCHNL RA PACING THRESHOLD AMPLITUDE: 1.2 V
MDC IDC MSMT LEADCHNL RA PACING THRESHOLD PULSEWIDTH: 0.4 ms
MDC IDC MSMT LEADCHNL RV PACING THRESHOLD AMPLITUDE: 0.8 V
MDC IDC PG IMPLANT DT: 20160815
MDC IDC PG SERIAL: 68600861
MDC IDC SET LEADCHNL RV PACING AMPLITUDE: 2 V
MDC IDC STAT BRADY AP VS PERCENT: 0 %
MDC IDC STAT BRADY RA PERCENT PACED: 52 %
MDC IDC STAT BRADY RV PERCENT PACED: 98 %
Pulse Gen Model: 394929

## 2017-09-05 ENCOUNTER — Ambulatory Visit (INDEPENDENT_AMBULATORY_CARE_PROVIDER_SITE_OTHER): Payer: Medicare Other | Admitting: Pharmacist

## 2017-09-05 DIAGNOSIS — Z952 Presence of prosthetic heart valve: Secondary | ICD-10-CM

## 2017-09-05 DIAGNOSIS — Z5181 Encounter for therapeutic drug level monitoring: Secondary | ICD-10-CM

## 2017-09-05 LAB — POCT INR: INR: 2.8

## 2017-09-26 ENCOUNTER — Ambulatory Visit (INDEPENDENT_AMBULATORY_CARE_PROVIDER_SITE_OTHER): Payer: Medicare Other | Admitting: *Deleted

## 2017-09-26 ENCOUNTER — Ambulatory Visit (INDEPENDENT_AMBULATORY_CARE_PROVIDER_SITE_OTHER): Payer: Medicare Other | Admitting: Internal Medicine

## 2017-09-26 ENCOUNTER — Encounter: Payer: Self-pay | Admitting: Internal Medicine

## 2017-09-26 ENCOUNTER — Encounter (INDEPENDENT_AMBULATORY_CARE_PROVIDER_SITE_OTHER): Payer: Self-pay

## 2017-09-26 VITALS — BP 102/60 | HR 67 | Ht 71.0 in | Wt 235.0 lb

## 2017-09-26 DIAGNOSIS — E669 Obesity, unspecified: Secondary | ICD-10-CM

## 2017-09-26 DIAGNOSIS — J449 Chronic obstructive pulmonary disease, unspecified: Secondary | ICD-10-CM | POA: Diagnosis not present

## 2017-09-26 DIAGNOSIS — Z8673 Personal history of transient ischemic attack (TIA), and cerebral infarction without residual deficits: Secondary | ICD-10-CM | POA: Diagnosis not present

## 2017-09-26 DIAGNOSIS — Z5181 Encounter for therapeutic drug level monitoring: Secondary | ICD-10-CM

## 2017-09-26 DIAGNOSIS — Z952 Presence of prosthetic heart valve: Secondary | ICD-10-CM

## 2017-09-26 LAB — POCT INR: INR: 1.9

## 2017-09-26 NOTE — Assessment & Plan Note (Addendum)
Body mass index is 32.78 kg/m.  -  trending up  Lab Results  Component Value Date   TSH 1.422 04/27/2015     Contributing to gerd risk/ doe/reviewed the need and the process to achieve and maintain neg calorie balance > defer f/u primary care including intermittently monitoring thyroid status    Advised re early mobilization post op to prevent atx related to obesity

## 2017-09-26 NOTE — Assessment & Plan Note (Addendum)
Quit smoking in 1985 - Spirometry 09/26/2017  FEV1 2.55 (83%)  Ratio 76 off all rx x months  So he is at risk of AB but no flares in months and has no significant copd at present so no contraindication to kidney transplant nor need for maint rx - can use saba prn going forward and no need for regular f/u   I had an extended final summary discussion with the patient reviewing all relevant studies completed to date and  lasting 15 to 20 minutes of a 25 minute visit on the following issues:     I reviewed the Fletcher curve with the patient that basically indicates  if you quit smoking when your best day FEV1 is still well preserved (as is clearly  the case here)  it is highly unlikely you will progress to severe disease and informed the patient there was  no medication on the market that has proven to alter the curve/ its downward trajectory  or the likelihood of progression of their disease(unlike other chronic medical conditions such as atheroclerosis where we do think we can change the natural hx with risk reducing meds)    Therefore  maintaining abstinence is the most important aspect of care, not choice of inhalers or for that matter, doctors.    rx is for symptoms or flares, neither of  Which applies here    I had an extended discussion with the patient and wife reviewing all relevant studies completed to date and  lasting 15 to 20 minutes of a 25 minute visit    Each maintenance medication was reviewed in detail including most importantly the difference between maintenance and prns and under what circumstances the prns are to be triggered using an action plan format that is not reflected in the computer generated alphabetically organized AVS.    Please see AVS for specific instructions unique to this visit that I personally wrote and verbalized to the the pt in detail and then reviewed with pt  by my nurse highlighting any  changes in therapy recommended at today's visit to their plan of care.

## 2017-09-26 NOTE — Progress Notes (Signed)
Jeffrey [name]    [id number]       Primary Care Physician:[name],[name], MD  Referring Physician: Thurman Coyer, MD [address] [place], [place] [address]  Chief complaint:  Consult for evaluation, management of COPD  Brief patient profile:  76-yowm obese s/p smoking cessation 1985  with history of end-stage renal disease on hemodialysis, COPD GOLD 0    diabetes mellitus, aortic stenosis s/p TAVR, ICD implantation. He was admitted in April 2018 for acute respiratory failure, COPD exacerbation in setting of parainfluenza virus. He was treated with Solu-Medrol, doonebs, Mucinex, doxycycline with improvement in symptoms.   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 Smoking history: 60 pack year smoking history. Quit in 1983.    History of Present Illness  04/16/2017 acute extended ov/Alleya Demeter re:  aecopd / GOLD 0 criteria previously  Chief Complaint  Patient presents with  . Acute Visit    Pt states "lung full of congestion"- increased SOB x 2 months. He states also coughing more and cough was prod last night after he used symbicort with green sputum.  He has only been using symbicort every 4 hours and not using his albuterol inhaler. He uses neb once per day on average.   downhill since transient improvement p d/c from hospital 2 m prior to OV   nausea is 24/7  symb over use (up to every 4 hours) and extremely poor insight into meds  HD mwf  No better with prednisone in past  Cough worse in am's, better p neb, green mucus mostly in ams Sob at rest between treatments and no better before vs after HD  rec Change Zantac 150 mg in am (after dialysis) and one at bedtime  omnicef 300 mg twice daily x 7 days  For cough > mucinex dm 1200 mg every 12 hours as needed and use the flutter valve as much as possible Plan A = Automatic = Symbicort 160 Take 2 puffs first  thing in am and then another 2 puffs about 12 hours later.  Plan B = Backup Only use your albuterol (proventil) as a rescue medication  Plan C = Crisis -   use your albuterol/ipatropium nebulizer if you first try Plan B and it fails to help > ok to use the nebulizer up to every 4 hours but if start needing it regularly call for immediate appointment See Tammy NP w/in 2 weeks with all your medications > did not do > admit with angina     09/26/2017  f/u ov/Tarea Skillman re:  COPD 0  No need for inhalers in months  Chief Complaint  Patient presents with  . Pulmonary Consult    Referred back by Dr Buelah Manis- needs pulm clearance for kidney transplant. Pt states his breathing is "great" and he has not respiratory problems.   Mostly sleeps on R side horizontally / one pillow no 02  No cough / congestion in am Able to do shopping at [facility]  ok/ walk downhill mb and back to house x about 150 ft qod s  Limiting sob/ cp / claudication No maint or prns now   No obvious day to day or daytime variability or assoc excess/ purulent sputum or mucus plugs or hemoptysis or cp or chest tightness, subjective wheeze or overt sinus or hb symptoms. No unusual exp hx or h/o childhood pna/ asthma or knowledge of premature  birth.  Sleeping ok flat without nocturnal  or early am exacerbation  of respiratory  c/o's or need for noct saba. Also denies any obvious fluctuation of symptoms with weather or environmental changes or other aggravating or alleviating factors except as outlined above   Current Allergies, Complete Past Medical History, Past Surgical History, Family History, and Social History were reviewed in [employer] record.  ROS  The following are not active complaints unless bolded Hoarseness, sore throat, dysphagia, dental problems, itching, sneezing,  nasal congestion or discharge of excess mucus or purulent secretions, ear ache,   fever, chills, sweats, unintended wt loss or wt gain, classically  pleuritic or exertional cp,  orthopnea pnd or leg swelling, presyncope, palpitations, abdominal pain, anorexia, nausea, vomiting, diarrhea  or change in bowel habits or change in bladder habits, change in stools or change in urine, dysuria, hematuria,  rash, arthralgias, visual complaints, headache, numbness, weakness or ataxia or problems with walking or coordination,  change in mood/affect or memory.        Current Meds  Medication Sig  . albuterol (PROVENTIL HFA;VENTOLIN HFA) 108 (90 Base) MCG/ACT inhaler Inhale into the lungs - not using x months  . aspirin EC 81 MG tablet Take 1 tablet (81 mg total) by mouth daily.  [name] atorvastatin (LIPITOR) 40 MG tablet Take 2 tablets (80 mg total) by mouth daily.  . cinacalcet (SENSIPAR) 30 MG tablet Take 30 mg by mouth daily with supper.  . insulin detemir (LEVEMIR) 100 UNIT/ML injection Inject 0.48 mLs (48 Units total) into the skin at bedtime.  [name] ipratropium-albuterol (DUONEB) 0.5-2.5 (3) MG/3ML SOLN No longer using at all   . lidocaine-prilocaine (EMLA) cream Apply 1 application topically once as needed (prior to accessing port).   . metoprolol tartrate (LOPRESSOR) 25 MG tablet Take 1 tablet (25 mg total) by mouth 2 (two) times daily.  . nitroGLYCERIN (NITROSTAT) 0.4 MG SL tablet Place 0.4 mg under the tongue every 5 (five) minutes as needed for chest pain.   . Nutritional Supplements (FEEDING SUPPLEMENT, NEPRO CARB STEADY,) LIQD Take 237 mLs by mouth 2 (two) times daily between meals.  . ranitidine (ZANTAC) 150 MG tablet Take 150 mg by mouth 2 (two) times daily as needed for heartburn.  . warfarin (COUMADIN) 5 MG tablet Take as directed by coumadin clinic              Physical Exam:   amb  Elderly obese wm nad    09/26/2017       235  04/16/17 230 lb (104.3 kg)  03/27/17 231 lb 12.8 oz (105.1 kg)  03/13/17 231 lb 7.7 oz (105 kg)    Vital signs reviewed  - Note on arrival 02 sats  94% on RA      HEENT: nl dentition, turbinates  bilaterally, and oropharynx. Nl external ear canals without cough reflex   NECK :  without JVD/Nodes/TM/ nl carotid upstrokes bilaterally   LUNGS: no acc muscle use,  Nl contour chest  Clear to A and P bilaterally   CV:  RRR  no s3 o-  II/VI sem with no increase in P2, and  No significant lower ext  edema   ABD: obese/ soft and nontender with nl inspiratory excursion in the supine position. No bruits or organomegaly appreciated, bowel sounds nl  MS:  Nl gait/ ext warm without deformities, calf tenderness, cyanosis or clubbing No obvious joint restrictions   SKIN: warm and dry without lesions    NEURO:  alert, approp, nl sensorium with  no motor or cerebellar deficits apparent.

## 2017-09-26 NOTE — Patient Instructions (Signed)
You do not have significant copd so you are cleared for surgery from a pulmonary perspective  No need for regular pulmonary follow up here- call if needed

## 2017-09-30 ENCOUNTER — Telehealth: Payer: Self-pay | Admitting: *Deleted

## 2017-09-30 NOTE — Telephone Encounter (Signed)
I mentioned to wife that per office visit with Angelena Form, patient was to f/u with Dr Marlou Porch but wife states that the patient has never saw Dr Marlou Porch and she questions why this was put in. There is a recall in for patient to see Dr Marlou Porch, but she stated that they never received a letter. Please advise. Thanks, MI

## 2017-09-30 NOTE — Telephone Encounter (Signed)
Will route to Dr. Marlou Porch to see if ok for pt to continue Imdur since he never d/c'ed it?

## 2017-09-30 NOTE — Telephone Encounter (Signed)
Patients wife called and requested a refill on isosorbide for the patient. This medication is not listed on patients current med list. Per 06/13/17 phone note patient was to d/c the isosorbide due to itching/rash the was possibly related to this medication. Per wife patient continued to take the isosorbide and the rash and itching subsided. She states that they discussed this with the coumadin clinic when he came in for his inr, but a note was not put in epic. Okay to refill this for the patient? Please advise. Thanks, MI

## 2017-10-03 NOTE — Telephone Encounter (Signed)
I am fine with him continuing his isosorbide. Candee Furbish, MD

## 2017-10-04 ENCOUNTER — Other Ambulatory Visit: Payer: Self-pay | Admitting: *Deleted

## 2017-10-04 MED ORDER — ISOSORBIDE DINITRATE 20 MG PO TABS: 20.0000 mg | ORAL_TABLET | Freq: Three times a day (TID) | ORAL | 2 refills | Status: DC

## 2017-10-04 NOTE — Telephone Encounter (Signed)
Patients wife called to follow up on request to reorder patients isosorbide. She is concerned as patient does not have enough medication to take all of today's doses. She is aware that per Dr Marlou Porch okay to continue isosorbide and that I will send rx in to [facility] as requested. She verbalized her understanding and appreciation.

## 2017-10-22 ENCOUNTER — Ambulatory Visit (INDEPENDENT_AMBULATORY_CARE_PROVIDER_SITE_OTHER): Payer: Medicare Other | Admitting: *Deleted

## 2017-10-22 ENCOUNTER — Encounter (INDEPENDENT_AMBULATORY_CARE_PROVIDER_SITE_OTHER): Payer: Self-pay

## 2017-10-22 DIAGNOSIS — Z5181 Encounter for therapeutic drug level monitoring: Secondary | ICD-10-CM | POA: Diagnosis not present

## 2017-10-22 DIAGNOSIS — Z952 Presence of prosthetic heart valve: Secondary | ICD-10-CM

## 2017-10-22 LAB — POCT INR: INR: 2.3

## 2017-10-22 NOTE — Patient Instructions (Signed)
Continue taking 1and 1/2 tablets every day except 1 tablet on Tuesday, Thursday, and Saturday.  Recheck INR in 4 weeks  Call coumadin clinic with any questions concerns any new medications or if scheduled for any new procedures  [phone]

## 2017-10-31 ENCOUNTER — Ambulatory Visit (INDEPENDENT_AMBULATORY_CARE_PROVIDER_SITE_OTHER): Payer: Medicare Other | Admitting: *Deleted

## 2017-10-31 DIAGNOSIS — I442 Atrioventricular block, complete: Secondary | ICD-10-CM

## 2017-10-31 NOTE — Progress Notes (Signed)
Remote pacemaker transmission.   

## 2017-11-08 ENCOUNTER — Encounter: Payer: Self-pay | Admitting: Cardiology

## 2017-11-08 LAB — CUP PACEART REMOTE DEVICE CHECK
Brady Statistic AS VP Percent: 49 %
Brady Statistic RA Percent Paced: 51 %
Brady Statistic RV Percent Paced: 100 %
Implantable Lead Implant Date: 20160815
Implantable Lead Location: 753859
Implantable Lead Model: 5076
Implantable Lead Model: 5076
Lead Channel Impedance Value: 421 Ohm
Lead Channel Pacing Threshold Amplitude: 1.2 V
Lead Channel Setting Pacing Amplitude: 2 V
Lead Channel Setting Pacing Pulse Width: 0.4 ms
Lead Channel Setting Sensing Sensitivity: 2 mV
MDC IDC LEAD IMPLANT DT: 20160815
MDC IDC LEAD LOCATION: 753860
MDC IDC MSMT LEADCHNL RA IMPEDANCE VALUE: 436 Ohm
MDC IDC MSMT LEADCHNL RA PACING THRESHOLD PULSEWIDTH: 0.4 ms
MDC IDC MSMT LEADCHNL RV PACING THRESHOLD AMPLITUDE: 0.7 V
MDC IDC MSMT LEADCHNL RV PACING THRESHOLD PULSEWIDTH: 0.4 ms
MDC IDC PG IMPLANT DT: 20160815
MDC IDC PG SERIAL: 68600861
MDC IDC SESS DTM: 20181214060744
MDC IDC SET LEADCHNL RA PACING AMPLITUDE: 2 V
MDC IDC STAT BRADY AP VP PERCENT: 51 %
MDC IDC STAT BRADY AP VS PERCENT: 0 %
MDC IDC STAT BRADY AS VS PERCENT: 0 %

## 2017-11-14 ENCOUNTER — Encounter (HOSPITAL_COMMUNITY): Payer: Self-pay | Admitting: Emergency Medicine

## 2017-11-14 ENCOUNTER — Emergency Department (HOSPITAL_COMMUNITY)
Admission: EM | Admit: 2017-11-14 | Discharge: 2017-11-14 | Disposition: A | Discharge status: Home / Self Care | Payer: Medicare Other | Attending: Emergency Medicine | Admitting: Emergency Medicine

## 2017-11-14 ENCOUNTER — Emergency Department (HOSPITAL_COMMUNITY): Payer: Medicare Other

## 2017-11-14 ENCOUNTER — Telehealth: Payer: Self-pay

## 2017-11-14 DIAGNOSIS — J449 Chronic obstructive pulmonary disease, unspecified: Secondary | ICD-10-CM | POA: Insufficient documentation

## 2017-11-14 DIAGNOSIS — Z794 Long term (current) use of insulin: Secondary | ICD-10-CM | POA: Diagnosis not present

## 2017-11-14 DIAGNOSIS — R072 Precordial pain: Secondary | ICD-10-CM | POA: Diagnosis not present

## 2017-11-14 DIAGNOSIS — I5042 Chronic combined systolic (congestive) and diastolic (congestive) heart failure: Secondary | ICD-10-CM | POA: Diagnosis not present

## 2017-11-14 DIAGNOSIS — N186 End stage renal disease: Secondary | ICD-10-CM | POA: Diagnosis not present

## 2017-11-14 DIAGNOSIS — Z7901 Long term (current) use of anticoagulants: Secondary | ICD-10-CM | POA: Diagnosis not present

## 2017-11-14 DIAGNOSIS — Z87891 Personal history of nicotine dependence: Secondary | ICD-10-CM | POA: Insufficient documentation

## 2017-11-14 DIAGNOSIS — R1013 Epigastric pain: Secondary | ICD-10-CM | POA: Insufficient documentation

## 2017-11-14 DIAGNOSIS — I2510 Atherosclerotic heart disease of native coronary artery without angina pectoris: Secondary | ICD-10-CM | POA: Insufficient documentation

## 2017-11-14 DIAGNOSIS — Z79899 Other long term (current) drug therapy: Secondary | ICD-10-CM | POA: Diagnosis not present

## 2017-11-14 DIAGNOSIS — E1122 Type 2 diabetes mellitus with diabetic chronic kidney disease: Secondary | ICD-10-CM | POA: Insufficient documentation

## 2017-11-14 DIAGNOSIS — Z992 Dependence on renal dialysis: Secondary | ICD-10-CM | POA: Diagnosis not present

## 2017-11-14 DIAGNOSIS — I132 Hypertensive heart and chronic kidney disease with heart failure and with stage 5 chronic kidney disease, or end stage renal disease: Secondary | ICD-10-CM | POA: Insufficient documentation

## 2017-11-14 DIAGNOSIS — Z7982 Long term (current) use of aspirin: Secondary | ICD-10-CM | POA: Insufficient documentation

## 2017-11-14 DIAGNOSIS — Z95810 Presence of automatic (implantable) cardiac defibrillator: Secondary | ICD-10-CM | POA: Insufficient documentation

## 2017-11-14 LAB — I-STAT TROPONIN, ED: Troponin i, poc: 0.02 ng/mL (ref 0.00–0.08)

## 2017-11-14 LAB — CBC
HEMATOCRIT: 37 % — AB (ref 39.0–52.0)
Hemoglobin: 12.4 g/dL — ABNORMAL LOW (ref 13.0–17.0)
MCH: 31.6 pg (ref 26.0–34.0)
MCHC: 33.5 g/dL (ref 30.0–36.0)
MCV: 94.4 fL (ref 78.0–100.0)
Platelets: 214 10*3/uL (ref 150–400)
RBC: 3.92 MIL/uL — ABNORMAL LOW (ref 4.22–5.81)
RDW: 15 % (ref 11.5–15.5)
WBC: 9.4 10*3/uL (ref 4.0–10.5)

## 2017-11-14 LAB — TROPONIN I: Troponin I: 0.03 ng/mL (ref <0.03)

## 2017-11-14 LAB — BASIC METABOLIC PANEL
Anion gap: 14 (ref 5–15)
BUN: 53 mg/dL — AB (ref 6–20)
CO2: 26 mmol/L (ref 22–32)
CREATININE: 6.41 mg/dL — AB (ref 0.61–1.24)
Calcium: 8.7 mg/dL — ABNORMAL LOW (ref 8.9–10.3)
Chloride: 93 mmol/L — ABNORMAL LOW (ref 101–111)
GFR calc Af Amer: 9 mL/min — ABNORMAL LOW (ref >60)
GFR, EST NON AFRICAN AMERICAN: 7 mL/min — AB (ref >60)
GLUCOSE: 224 mg/dL — AB (ref 65–99)
Potassium: 5 mmol/L (ref 3.5–5.1)
Sodium: 133 mmol/L — ABNORMAL LOW (ref 135–145)

## 2017-11-14 LAB — LIPASE, BLOOD: Lipase: 43 U/L (ref 11–51)

## 2017-11-14 LAB — BRAIN NATRIURETIC PEPTIDE: B Natriuretic Peptide: 159.6 pg/mL — ABNORMAL HIGH (ref 0.0–100.0)

## 2017-11-14 NOTE — ED Triage Notes (Signed)
Pt was sent over from his MD for an abnormal EKG and was told he could have a blockage. Pt states for 2 weeks he has been having intermittent chest pains. Pt is also dialysis patient with last full treatment  Yesterday.

## 2017-11-14 NOTE — ED Notes (Signed)
Pt updated as to why MD has not been in the room yet.

## 2017-11-14 NOTE — Telephone Encounter (Signed)
Patient's wife called to report that they were at Dr. Collier Flowers office and were instructed to proceed to the ED immediately because "EKG showed blockage." She could not give any other information. She called to ask if he should in fact go to the hospital.  Instructed her to do as instructed since the doctor who saw and examined the patient deemed it necessary to go. She agreed with treatment plan.

## 2017-11-14 NOTE — Consult Note (Signed)
Cardiology Consultation:   Patient ID: Jeffrey [name]; [id number]; 12/04/1940   Admit date: 11/14/2017 Date of Consult: 11/14/2017  Primary Care Provider: Thurman Coyer, MD Primary Cardiologist: Dr. Yevonne [name] Primary Electrophysiologist: Dr. Caryl [name]   Patient Profile:   Jeffrey [name] is a 76 y.o. male with a hx of history of AS s/p TAVR 08/02/2015, CHB s/p PPM, know obstructive CAD per cath 04/2017 treated medically, ESRD on HD (MWF), HTN, PAF on Coumadin, HLD, DM2 and chronic systolic CHF who is being seen today for the evaluation of chest pain at the request of Dr. Dayna [name].  History of Present Illness:   Jeffrey [name] is a pleasant man who presented to the ED with intermittent, worsening epigastric pain that began approximately 2 weeks ago. He describes the pain as a dull, heavy feeling that seems worse with exertion and lying down and becomes more prominent at night and after dialysis. He admits to nausea with some vomiting, weakness, and mild dizziness that started yesterday,11/13/17. He denies fever or diarrhea. At times, he finds it hard to breath and has used his home oxygen which was leftover from a PNA dx. He checked his O2 saturation prior to starting the O2 and found it to be in the 60's. He states that the O2 made his epigastric pain and SOB better. He has also been taking Ranitidine and one dose of Zofran with some relief. He denies palpitations, numbness or tingling in the arms or hands, back or jaw pain. His symptoms are not similar to his symptoms from 04/2017 in which he was found to have multi-vessel CAD with a calcified proximal to mid LAD with 60-70% proximal LAD occulusion. Recommendations at that time were to treat him medically, with the possibility of stenting the LAD at a later date. He has been stable with chest pain since his last admission 6/2-6/8/20018.   In the ED, his troponin levels have been negative, CXR showed mild cardiomegaly, but otherwise  unremarkable. EKG is unconcerning for ischemia and appears to be similar to past tracings.     Past Medical History:  Diagnosis Date  . Anginal pain ([place])   . Aortic stenosis    a. severe by echo 09/2014  . Atrial fibrillation ([place])    a. not well documented, not on anticoagulation  . CHF (congestive heart failure) ([place])    04/28/17 echo-EF 40%, mod LVH, diastolic dysfunction  . Claustrophobia   . Complete heart block ([place])   . COPD (chronic obstructive pulmonary disease) ([place])   . Coronary artery disease    a. chronically occluded RCA per cath 09/2014 with collaterals B. cath 05/01/17 chr occ RCA w/collaterals, 60-70% mid LAD,   . CVA (cerebral vascular accident) ([place]) 10/2014   denies residual on 07/11/2015  . ESRD (end stage renal disease) on dialysis [facility])    a. on dialysis; [facility]; MWF, LUE fistula (07/11/2015)  . History of blood transfusion    "related to gallbladder OR"  . History of stomach ulcers   . Hyperlipidemia   . Hypertension   . Iron deficiency anemia   . Myocardial infarction ([place]) 10/2014  . Peripheral vascular disease ([place])   . Pneumonia   . Presence of permanent cardiac pacemaker   . Renal insufficiency   . 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 Type II diabetes mellitus ([facility])     Past  Surgical History:  Procedure Laterality Date  . AV FISTULA PLACEMENT Left 10/19/2014   Procedure: BRACHIOCEPHALIC ARTERIOVENOUS (AV) FISTULA CREATION ;  Surgeon: Conrad Halawa, MD;  Location: [place];  Service: Vascular;  Laterality: Left;  . CARDIAC CATHETERIZATION    . CARDIAC CATHETERIZATION N/A 07/22/2015   Procedure: Right/Left Heart Cath and Coronary Angiography;  Surgeon: [name], MD;  Location: [facility] CV LAB;  Service: Cardiovascular;  Laterality: N/A;  . CATARACT EXTRACTION W/ INTRAOCULAR LENS  IMPLANT, BILATERAL Bilateral  1990's  . CHOLECYSTECTOMY OPEN  1980's  . COLONOSCOPY W/ BIOPSIES AND POLYPECTOMY    . CORONARY ANGIOPLASTY    . EP IMPLANTABLE DEVICE N/A 07/11/2015   Procedure: Pacemaker Implant;  Surgeon: [name], MD;  Location: [place] CV LAB;  Service: Cardiovascular;  Laterality: N/A;  . ESOPHAGOGASTRODUODENOSCOPY  08/01/2012   Procedure: ESOPHAGOGASTRODUODENOSCOPY (EGD);  Surgeon: [name], MD;  Location: [facility];  Service: Endoscopy;  Laterality: N/A;  . INSERT / REPLACE / REMOVE PACEMAKER  07/11/2015  . INSERTION OF DIALYSIS CATHETER Right 02/02/2015   Procedure: INSERTION OF DIALYSIS CATHETER  RIGHT INTERNAL JUGULAR;  Surgeon: Mal [name], MD;  Location: [facility];  Service: Vascular;  Laterality: Right;  . LEFT AND RIGHT HEART CATHETERIZATION WITH CORONARY ANGIOGRAM N/A 09/30/2014   Procedure: LEFT AND RIGHT HEART CATHETERIZATION WITH CORONARY ANGIOGRAM;  Surgeon: [name], MD;  Location[facility];  Service: Cardiovascular;  Laterality: N/A;  . TEE WITHOUT CARDIOVERSION N/A 08/02/2015   Procedure: TRANSESOPHAGEAL ECHOCARDIOGRAM (TEE);  Surgeon: [name], MD;  Location: [facility];  Service: Open Heart Surgery;  Laterality: N/A;  . TONSILLECTOMY    . TRANSCATHETER AORTIC VALVE REPLACEMENT, TRANSFEMORAL Left 08/02/2015   Procedure: TRANSCATHETER AORTIC VALVE REPLACEMENT, TRANSFEMORAL;  Surgeon: [name], MD;  Location: [place];  Service: Open Heart Surgery;  Laterality: Left;     Prior to Admission medications   Medication Sig Start Date End Date Taking? Authorizing Provider  albuterol (PROVENTIL HFA;VENTOLIN HFA) 108 (90 Base) MCG/ACT inhaler Inhale into the lungs. 03/26/17  Yes [provider]  aspirin EC 81 MG tablet Take 1 tablet (81 mg total) by mouth daily. 05/15/17  Yes Eileen Stanford, PA-C  atorvastatin (LIPITOR) 40 MG tablet Take 2 tablets (80 mg total) by mouth daily. 05/03/17  Yes Allie Bossier, MD  cinacalcet (SENSIPAR) 30 MG tablet Take 30 mg by  mouth daily with supper.   Yes [provider]  insulin detemir (LEVEMIR) 100 UNIT/ML injection Inject 0.48 mLs (48 Units total) into the skin at bedtime. 05/03/17  Yes Allie Bossier, MD  ipratropium-albuterol (DUONEB) 0.5-2.5 (3) MG/3ML SOLN Take 3 mLs by nebulization every 6 (six) hours. 04/02/17  Yes Mannam, Praveen, MD  isosorbide dinitrate (ISORDIL) 20 MG tablet Take 1 tablet (20 mg total) 3 (three) times daily by mouth. 10/04/17  Yes Jerline Pain, MD  lidocaine-prilocaine (EMLA) cream Apply 1 application topically once as needed (prior to accessing port).    Yes [provider]  metoprolol tartrate (LOPRESSOR) 25 MG tablet Take 1 tablet (25 mg total) by mouth 2 (two) times daily. 05/03/17  Yes Allie Bossier, MD  nitroGLYCERIN (NITROSTAT) 0.4 MG SL tablet Place 0.4 mg under the tongue every 5 (five) minutes as needed for chest pain.    Yes [provider]  Nutritional Supplements (FEEDING SUPPLEMENT, NEPRO CARB STEADY,) LIQD Take 237 mLs by mouth 2 (two) times daily between meals. 05/04/17  Yes Dia Crawford  J, MD  ranitidine (ZANTAC) 150 MG tablet Take 150 mg by mouth 2 (two) times daily as needed for heartburn.   Yes [provider]  warfarin (COUMADIN) 5 MG tablet Take as directed by coumadin clinic Patient taking differently: Take 5-7.5 mg by mouth See admin instructions. 5mg  by mouth once daily Tues, Thurs, Sat - 7.5mg  by mouth once daily on Mon, Wed, Fri, 08/20/17  Yes Jerline Pain, MD    Allergies:    Allergies  Allergen Reactions  . Byetta 10 Mcg Pen [Exenatide] Diarrhea and Nausea And Vomiting  . Codeine Itching    Social History:   Social History   Socioeconomic History  . Marital status: Married    Spouse name: Not on file  . Number of children: Not on file  . Years of education: Not on file  . Highest education level: Not on file  Social Needs  . Financial resource strain: Not on file  . Food insecurity - worry: Not on file  . Food  insecurity - inability: Not on file  . Transportation needs - medical: Not on file  . Transportation needs - non-medical: Not on file  Occupational History  . Not on file  Tobacco Use  . Smoking status: Former Smoker    Packs/day: 2.00    Years: 32.00    Pack years: 64.00    Last attempt to quit: 11/27/1983    Years since quitting: 33.9  . Smokeless tobacco: Never Used  Substance and Sexual Activity  . Alcohol use: Yes    Comment: occ  . Drug use: No  . Sexual activity: Yes    Birth control/protection: None  Other Topics Concern  . Not on file  Social History Narrative  . Not on file    Family History:   Family History  Problem Relation Age of [place]set  . Diabetes Father   . Heart disease Father   . Diabetes Sister   . Alzheimer's disease Mother    Family Status:  Family Status  Relation Name Status  . Father  Deceased  . Sister  Alive  . Mother  Deceased  . MGM  Deceased  . MGF  Deceased  . PGM  Deceased  . PGF  Deceased    ROS:  Please see the history of present illness.  All other ROS reviewed and negative.     Physical Exam/Data:   Vitals:   11/14/17 1430 11/14/17 1445 11/14/17 1500 11/14/17 1530  BP: (!) 129/53 124/89 (!) 119/57 (!) 117/57  Pulse: 71 70 76 71  Resp: 19 19 18  (!) 21  Temp:      TempSrc:      SpO2: 96% 98% 98% 95%  Weight:      Height:       No intake or output data in the 24 hours ending 11/14/17 1740 Filed Weights   11/14/17 1338  Weight: 237 lb (107.5 kg)   Body mass index is 33.05 kg/m.   General: Well developed, well nourished, NAD Skin: Warm, dry, intact  Head: Normocephalic, atraumatic, clear, moist mucus membranes. Neck: Negative for carotid bruits. No JVD Lungs:Clear to ausculation bilaterally. No wheezes, rales, or rhonchi. Breathing is unlabored. Cardiovascular: RRR with S1 S2. No murmurs, rubs, gallops, or LV heave appreciated. Abdomen: Firm, tender mid epigastric region with normoactive bowel sounds. He is  guarding. No obvious abdominal masses. MSK: Strength and tone appear normal for age. 5/5 in all extremities Extremities: No edema. No clubbing or cyanosis. DP/PT  pulses 2+ bilaterally Neuro: Alert and oriented. No focal deficits. No facial asymmetry. MAE spontaneously. Psych: Responds to questions appropriately with normal affect.     EKG:  The EKG was personally reviewed and demonstrates: 11/14/17 V-paced SR-72 Telemetry:  Telemetry was personally reviewed and demonstrates: V-pace SR 82  Relevant CV Studies:  ECHO: Echo 04/28/17  Study Conclusions - Left ventricle: The cavity size was normal. Wall thickness was increased in a pattern of moderate LVH. Systolic function was mildly to moderately reduced. The estimated ejection fraction was 40%. Diffuse hypokinesis. Doppler parameters are consistent with restrictive physiology, indicative of decreased left ventricular diastolic compliance and/or increased left atrial pressure. Doppler parameters are consistent with high ventricular filling pressure. - Regional wall motion abnormality: Hypokinesis of the basal inferoseptal myocardium; moderate hypokinesis of the apical anterior, apical septal, apical lateral, and apical myocardium. - Aortic valve: Bioprosthetic aortic valve s/p transcatheter aortic valve replacement. No significant bioprosthetic valvular stenosis. There was no regurgitation. Peak velocity (S): 290 cm/s. Mean gradient (S): 15 mm Hg. Valve area (VTI): 2.16 cm^2. Valve area (Vmax): 2.08 cm^2. Valve area (Vmean): 2.15 cm^2. - Mitral valve: Calcified annulus. Mildly thickened leaflets . There was moderate regurgitation. There is some acoustic shadowing from mitral annular calcification. - Left atrium: The atrium was severely dilated. - Right ventricle: Pacer wire or catheter noted in right ventricle. - Right atrium: The atrium was mildly dilated. - Tricuspid valve: There was mild  regurgitation. - Pulmonic valve: There was mild regurgitation. Impressions: - I compared today&'s study to the study dated 07/19/16. LVEF appears slightly better in some views and worse in others, but appears to be in the 40% range. The apex, apical septum, anteroapical wall, and anterolateral walls may be slightly more hypokinetic than before.  CATH:   05/01/17 Right Heart Cath and Coronary Angiography  Conclusion     Prox LAD lesion, 65 %stenosed.  Prox Cx to Mid Cx lesion, 40 %stenosed.  3rd Mrg lesion, 30 %stenosed.  Mid RCA lesion, 100 %stenosed.  Hemodynamic findings consistent with mild pulmonary hypertension.  Mild elevation of right heart pressures with mild pulmonary hypertension.  Well-seated TAVR valve.  Multi-vessel CAD with a calcified proximal to mid LAD with 60-70% proximal LAD stenosis, which has slightly progressed from previously, 30-40% mid AV groove circumflex stenoses with 30% stenosis in the distal marginal branch; and previously noted old total occlusion of the mid RCA with left-to-right collaterals to the PDA vessel.  RECOMMENDATION: Recommend an initial increased medical therapy trial. If patient develops recurrent symptomatology, consider possible PCI with atherectomy to the proximal LAD lesion.    Laboratory Data:  Chemistry Recent Labs  Lab 11/14/17 1232  NA 133*  K 5.0  CL 93*  CO2 26  GLUCOSE 224*  BUN 53*  CREATININE 6.41*  CALCIUM 8.7*  GFRNONAA 7*  GFRAA 9*  ANIONGAP 14    Total Protein  Date Value Ref Range Status  06/17/2017 5.9 (L) 6.5 - 8.1 g/dL Final   Albumin  Date Value Ref Range Status  06/17/2017 3.4 (L) 3.5 - 5.0 g/dL Final   AST  Date Value Ref Range Status  06/17/2017 14 (L) 15 - 41 U/L Final   ALT  Date Value Ref Range Status  06/17/2017 8 (L) 17 - 63 U/L Final   Alkaline Phosphatase  Date Value Ref Range Status  06/17/2017 76 38 - 126 U/L Final   Total Bilirubin  Date Value Ref  Range Status  06/17/2017 0.6 0.3 - 1.2 mg/dL  Final   Hematology Recent Labs  Lab 11/14/17 1232  WBC 9.4  RBC 3.92*  HGB 12.4*  HCT 37.0*  MCV 94.4  MCH 31.6  MCHC 33.5  RDW 15.0  PLT 214   Cardiac EnzymesNo results for input(s): TROPONINI in the last 168 hours.  Recent Labs  Lab 11/14/17 1255  TROPIPOC 0.02    BNP Recent Labs  Lab 11/14/17 1238  BNP 159.6*    DDimer No results for input(s): DDIMER in the last 168 hours. TSH:  Lab Results  Component Value Date   TSH 1.422 04/27/2015   Lipids: Lab Results  Component Value Date   CHOL 95 04/30/2017   HDL 55 04/30/2017   LDLCALC 30 04/30/2017   TRIG 51 04/30/2017   CHOLHDL 1.7 04/30/2017   HgbA1c: Lab Results  Component Value Date   HGBA1C 7.0 (H) 04/29/2017    Radiology/Studies:  Dg Chest 2 View  Result Date: 11/14/2017 CLINICAL DATA:  Initial evaluation for Eric epigastric pain in shortness breath for 2 weeks. EXAM: CHEST  2 VIEW COMPARISON:  Prior radiograph from 04/27/2017. FINDINGS: Dual lead right-sided pacemaker/ AICD in place with electrodes overlying the right atrium and right ventricle. Mild cardiomegaly, stable. Mediastinal silhouette normal. Aortic atherosclerosis. Lungs normally inflated. Minimal subsegmental atelectasis present at the lung bases. No focal infiltrates. No pulmonary edema or pleural effusion. No pneumothorax. No acute osseus abnormality. IMPRESSION: 1. No active cardiopulmonary disease. 2. Mild cardiomegaly. 3. Aortic atherosclerosis. Electronically Signed   By: Jeannine Boga M.D.   On: 11/14/2017 13:15    Assessment and Plan:   1. CAD:  -Chest pain appears to be epigastric in location -Troponin level negative -EKG unremarkable -CXR negative -CP/epigastirc pain free now -Pt has know LAD lesion with recommendations to treat medically -ASA, stain  2. Chronic systolic heart failure:  -On BB -No ACE/ARB given ESRD  3. Persistent Afib: -On coumadin therapy -No s/s of  bleeding -Pt followed by coumadin clinic -CHADSVASC=5  4. S/p TAVR: -Followed by Dr. Velora Mediate -Echo on 04/28/17 showed stable valve replacement with Mean gradient (S): 15 mm Hg and Vmax 2.08 cm^2  5. ESRD: -HD MWF -Followed by Dr. Lorrene Reid   Active Problems:   * No active hospital problems. *   For questions or updates, please contact [place] Please consult [email or web address] for contact info under Cardiology/STEMI.   Signed, Kathyrn Drown, NP  11/14/2017 5:40 PM  As above, patient seen and examined.  Briefly he is a 76 year old male with past medical history of aortic stenosis status post prior TAVR, prior pacemaker, coronary artery disease, end-stage renal disease dialysis dependent, paroxysmal atrial fibrillation on Coumadin, hypertension, diabetes mellitus, hyperlipidemia for evaluation of epigastric/abdominal pain.  Last echocardiogram June 2018 showed ejection fraction 40%.  Patient is status post aortic valve replacement with mean gradient 15 mmHg and no aortic insufficiency.  Moderate mitral regurgitation severe left atrial enlargement; and mild right atrial enlargement.  Last catheterization June 2018 showed 65% proximal LAD and chronically occluded RCA.  Patient states he has had intermittent abdominal pain for 2 weeks.  It is in the upper abdomen and not related to exertion.  No radiation.  He has also had nausea but no diarrhea.  No melena or hematochezia.  He developed pain yesterday following dialysis which was persistent until 4 AM.  He also notes some dyspnea on exertion and orthopnea.  He came to the emergency room and cardiology asked to evaluate.  On examination he does have some abdominal tenderness to  palpation but no masses; no rebound or guarding.  Troponin is 0.02.  Electrocardiogram is AV paced.  1 epigastric/abdominal pain-pain is likely GI in etiology.  There was some improvement with Zantac.  It was continuous for approximately 15 hours and his troponin is  normal.  He has therefore ruled out.  I do not think he requires further cardiology evaluation.  His most recent catheterization is outlined above and he is not having exertional chest pain.  Would recommend further GI evaluation per emergency room.  2 coronary artery disease-continue statin.  3 history of paroxysmal atrial fibrillation-patient is AV paced.  Continue metoprolol for rate control if atrial fibrillation recurs.  Continue Coumadin. CHADSvasc 8.  4 status post aortic valve replacement-most recent echocardiogram showed normal function.  Continue SBE prophylaxis.  5 status post pacemaker  6 end-stage renal disease-Per nephrology.  We will sign off.  Patient can be discharged from a cardiac standpoint once fully evaluated and treated for abdominal pain.  Further management per emergency room.  Kirk Ruths, MD

## 2017-11-14 NOTE — ED Notes (Signed)
Pt taken to CT.

## 2017-11-14 NOTE — ED Provider Notes (Signed)
[facility] EMERGENCY DEPARTMENT Provider Note   CSN: [id number] Arrival date & time: 11/14/17  1218     History   Chief Complaint Chief Complaint  Patient presents with  . Chest Pain    HPI Jeffrey [name] is a 76 y.o. male.   Chest Pain   This is a recurrent problem. The current episode started more than 1 week ago. The problem occurs daily. The problem has been gradually worsening. The pain is associated with exertion, walking, movement and breathing. The pain is present in the substernal region. The pain is moderate. The quality of the pain is described as brief, heavy and sharp. The pain does not radiate. Associated symptoms include abdominal pain. Pertinent negatives include no fever and no nausea. He has tried nothing for the symptoms. The treatment provided no relief. There are no known risk factors.    Past Medical History:  Diagnosis Date  . Anginal pain ([place])   . Aortic stenosis    a. severe by echo 09/2014  . Atrial fibrillation ([place])    a. not well documented, not on anticoagulation  . CHF (congestive heart failure) ([place])    04/28/17 echo-EF 40%, mod LVH, diastolic dysfunction  . Claustrophobia   . Complete heart block ([place])   . COPD (chronic obstructive pulmonary disease) ([place])   . Coronary artery disease    a. chronically occluded RCA per cath 09/2014 with collaterals B. cath 05/01/17 chr occ RCA w/collaterals, 60-70% mid LAD,   . CVA (cerebral vascular accident) ([place]) 10/2014   denies residual on 07/11/2015  . ESRD (end stage renal disease) on dialysis [facility])    a. on dialysis; [facility]; MWF, LUE fistula (07/11/2015)  . History of blood transfusion    "related to gallbladder OR"  . History of stomach ulcers   . Hyperlipidemia   . Hypertension   . Iron deficiency anemia   . Myocardial infarction ([place]) 10/2014  . Peripheral vascular disease ([place])   . Pneumonia   . Presence of permanent cardiac pacemaker   . Renal insufficiency   .  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 Type II diabetes mellitus [facility])     Patient Active Problem List   Diagnosis Date Noted  . COPD GOLD 0 09/26/2017  . Atrial fibrillation ([place]) [I48.91] 05/21/2017  . Encounter for therapeutic drug monitoring 05/21/2017  . Coronary artery disease   . Obesity (BMI 30-39.9) 04/17/2017  . Hyponatremia 03/10/2017  . COPD with acute exacerbation ([place]) 03/10/2017  . Non-healing open wound of left groin 09/05/2015  . Automatic implantable cardioverter-defibrillator in situ 08/08/2015  . 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 Aortic stenosis   . Hypervolemia   . Severe aortic stenosis   . Chronic combined systolic and diastolic CHF (congestive heart failure) ([place]) 07/19/2015  . Fluid overload 07/18/2015  . Acute on chronic respiratory failure with hypoxia ([place]) 07/18/2015  . Cardiac device in situ, other   . 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/2015  . Chest pain   . Atherosclerosis of native coronary artery of native heart with angina pectoris ([place])   . Protein-calorie malnutrition, severe ([place]) 01/14/2015  . ESRD (end stage renal disease) on dialysis ([place]) 01/12/2015  . Aortic valve stenosis, moderate 01/11/2015  . Mobitz type 1 second degree AV block   . Elevated troponin 09/30/2014  . History of  stroke 09/29/2014  . Type 2 diabetes mellitus with chronic kidney disease on chronic dialysis, with long-term current use of insulin ([place]) 09/29/2014  . CAD (coronary artery disease) 09/29/2014  . Essential hypertension   . History of tobacco use 11/21/2012  . Hyperlipidemia 01/19/2010  . Arthritis, degenerative 01/19/2010  . Allergic rhinitis 12/05/2009  . Idiopathic peripheral neuropathy 01/05/2009  . 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  . Absolute anemia 11/13/2007     Past Surgical History:  Procedure Laterality Date  . AV FISTULA PLACEMENT Left 10/19/2014   Procedure: BRACHIOCEPHALIC ARTERIOVENOUS (AV) FISTULA CREATION ;  Surgeon: Conrad Opal, MD;  Location: [facility];  Service: Vascular;  Laterality: Left;  . CARDIAC CATHETERIZATION    . CARDIAC CATHETERIZATION N/A 07/22/2015   Procedure: Right/Left Heart Cath and Coronary Angiography;  Surgeon: [name], MD;  Location: [facility] CV LAB;  Service: Cardiovascular;  Laterality: N/A;  . CATARACT EXTRACTION W/ INTRAOCULAR LENS  IMPLANT, BILATERAL Bilateral 1990's  . CHOLECYSTECTOMY OPEN  1980's  . COLONOSCOPY W/ BIOPSIES AND POLYPECTOMY    . CORONARY ANGIOPLASTY    . EP IMPLANTABLE DEVICE N/A 07/11/2015   Procedure: Pacemaker Implant;  Surgeon: [name], MD;  Location: [facility] CV LAB;  Service: Cardiovascular;  Laterality: N/A;  . ESOPHAGOGASTRODUODENOSCOPY  08/01/2012   Procedure: ESOPHAGOGASTRODUODENOSCOPY (EGD);  Surgeon: [name], MD;  Location: [facility];  Service: Endoscopy;  Laterality: N/A;  . INSERT / REPLACE / REMOVE PACEMAKER  07/11/2015  . INSERTION OF DIALYSIS CATHETER Right 02/02/2015   Procedure: INSERTION OF DIALYSIS CATHETER  RIGHT INTERNAL JUGULAR;  Surgeon: Mal [name], MD;  Location: [facility];  Service: Vascular;  Laterality: Right;  . LEFT AND RIGHT HEART CATHETERIZATION WITH CORONARY ANGIOGRAM N/A 09/30/2014   Procedure: LEFT AND RIGHT HEART CATHETERIZATION WITH CORONARY ANGIOGRAM;  Surgeon: [name], MD;  Location: [facility] CATH LAB;  Service: Cardiovascular;  Laterality: N/A;  . TEE WITHOUT CARDIOVERSION N/A 08/02/2015   Procedure: TRANSESOPHAGEAL ECHOCARDIOGRAM (TEE);  Surgeon: [name], MD;  Location: [facility];  Service: Open Heart Surgery;  Laterality: N/A;  . TONSILLECTOMY    . TRANSCATHETER AORTIC VALVE REPLACEMENT, TRANSFEMORAL Left 08/02/2015   Procedure: TRANSCATHETER AORTIC VALVE REPLACEMENT, TRANSFEMORAL;  Surgeon: [name], MD;   Location: [facility];  Service: Open Heart Surgery;  Laterality: Left;       Home Medications    Prior to Admission medications   Medication Sig Start Date End Date Taking? Authorizing Provider  albuterol (PROVENTIL HFA;VENTOLIN HFA) 108 (90 Base) MCG/ACT inhaler Inhale into the lungs. 03/26/17  Yes [provider]  aspirin EC 81 MG tablet Take 1 tablet (81 mg total) by mouth daily. 05/15/17  Yes Eileen Stanford, PA-C  atorvastatin (LIPITOR) 40 MG tablet Take 2 tablets (80 mg total) by mouth daily. 05/03/17  Yes Allie Bossier, MD  cinacalcet (SENSIPAR) 30 MG tablet Take 30 mg by mouth daily with supper.   Yes [provider]  insulin detemir (LEVEMIR) 100 UNIT/ML injection Inject 0.48 mLs (48 Units total) into the skin at bedtime. 05/03/17  Yes Allie Bossier, MD  ipratropium-albuterol (DUONEB) 0.5-2.5 (3) MG/3ML SOLN Take 3 mLs by nebulization every 6 (six) hours. 04/02/17  Yes Mannam, Praveen, MD  isosorbide dinitrate (ISORDIL) 20 MG tablet Take 1 tablet (20 mg total) 3 (three) times daily by mouth. 10/04/17  Yes Jerline Pain, MD  lidocaine-prilocaine (EMLA) cream Apply 1 application topically once as needed (prior to accessing port).  Yes [provider]  metoprolol tartrate (LOPRESSOR) 25 MG tablet Take 1 tablet (25 mg total) by mouth 2 (two) times daily. 05/03/17  Yes Allie Bossier, MD  nitroGLYCERIN (NITROSTAT) 0.4 MG SL tablet Place 0.4 mg under the tongue every 5 (five) minutes as needed for chest pain.    Yes [provider]  Nutritional Supplements (FEEDING SUPPLEMENT, NEPRO CARB STEADY,) LIQD Take 237 mLs by mouth 2 (two) times daily between meals. 05/04/17  Yes Allie Bossier, MD  ranitidine (ZANTAC) 150 MG tablet Take 150 mg by mouth 2 (two) times daily as needed for heartburn.   Yes [provider]  warfarin (COUMADIN) 5 MG tablet Take as directed by coumadin clinic Patient taking differently: Take 5-7.5 mg by mouth See admin instructions.  5mg  by mouth once daily Tues, Thurs, Sat - 7.5mg  by mouth once daily on Mon, Wed, Fri, 08/20/17  Yes Jerline Pain, MD    Family History Family History  Problem Relation Age of Onset  . Diabetes Father   . Heart disease Father   . Diabetes Sister   . Alzheimer's disease Mother     Social History Social History   Tobacco Use  . Smoking status: Former Smoker    Packs/day: 2.00    Years: 32.00    Pack years: 64.00    Last attempt to quit: 11/27/1983    Years since quitting: 33.9  . Smokeless tobacco: Never Used  Substance Use Topics  . Alcohol use: Yes    Comment: occ  . Drug use: No     Allergies   Byetta 10 mcg pen [exenatide] and Codeine   Review of Systems Review of Systems  Constitutional: Negative for fever.  Cardiovascular: Positive for chest pain.  Gastrointestinal: Positive for abdominal pain. Negative for nausea.  All other systems reviewed and are negative.    Physical Exam Updated Vital Signs BP 122/61   Pulse 77   Temp 97.8 F (36.6 C) (Oral)   Resp 16   Ht 5\' 11"  (1.803 m)   Wt 107.5 kg (237 lb)   SpO2 96%   BMI 33.05 kg/m   Physical Exam  Constitutional: He is oriented to person, place, and time. He appears well-developed and well-nourished.  HENT:  Head: Normocephalic and atraumatic.  Eyes: Conjunctivae and EOM are normal.  Neck: Normal range of motion.  Cardiovascular: Normal rate.  Pulmonary/Chest: [place] normal. No stridor. No respiratory distress. He has no wheezes.  Abdominal: Soft. He exhibits no distension. There is no tenderness.  Musculoskeletal: Normal range of motion. He exhibits no edema or deformity.  Neurological: He is alert and oriented to person, place, and time. No cranial nerve deficit.  Skin: Skin is warm and dry.  Nursing note and vitals reviewed.    ED Treatments / Results  Labs (all labs ordered are listed, but only abnormal results are displayed) Labs Reviewed  BASIC METABOLIC PANEL - Abnormal; Notable for  the following components:      Result Value   Sodium 133 (*)    Chloride 93 (*)    Glucose, Bld 224 (*)    BUN 53 (*)    Creatinine, Ser 6.41 (*)    Calcium 8.7 (*)    GFR calc non Af Amer 7 (*)    GFR calc Af Amer 9 (*)    All other components within normal limits  CBC - Abnormal; Notable for the following components:   RBC 3.92 (*)    Hemoglobin 12.4 (*)  HCT 37.0 (*)    All other components within normal limits  BRAIN NATRIURETIC PEPTIDE - Abnormal; Notable for the following components:   B Natriuretic Peptide 159.6 (*)    All other components within normal limits  TROPONIN I - Abnormal; Notable for the following components:   Troponin I 0.03 (*)    All other components within normal limits  LIPASE, BLOOD  I-STAT TROPONIN, ED    EKG  EKG Interpretation  Date/Time:  Thursday November 14 2017 12:22:54 EST Ventricular Rate:  72 PR Interval:  180 QRS Duration: 212 QT Interval:  488 QTC Calculation: 534 R Axis:   -76 Text Interpretation:  AV dual-paced rhythm Abnormal ECG No significant change since last tracing paced rhythm noted  Confirmed by Thomasene Lot, [place]ene[place] [phone]) on 11/14/2017 2:45:51 PM       Radiology Ct Abdomen Pelvis Wo Contrast  Result Date: 11/14/2017 CLINICAL DATA:  76 year old male with abdominal pain, nausea and vomiting for 2 weeks. EXAM: CT ABDOMEN AND PELVIS WITHOUT CONTRAST TECHNIQUE: Multidetector CT imaging of the abdomen and pelvis was performed following the standard protocol without IV contrast. COMPARISON:  06/17/2017 and 07/21/2015 CTs FINDINGS: Please note that parenchymal abnormalities may be missed without intravenous contrast. Lower chest: No acute abnormality. Cardiomegaly, aortic valve replacement and pacemaker noted. Hepatobiliary: No significant hepatic abnormalities identified. The patient is status post cholecystectomy. No biliary dilatation. Pancreas: Unremarkable Spleen: Unremarkable Adrenals/Urinary Tract: Bilateral renal atrophy  and a left renal cyst are again noted. There is no evidence of hydronephrosis or obstructing urinary calculi. 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 The right adrenal gland is unremarkable. Bladder is unchanged with a moderate to large diverticulum. Stomach/Bowel: Stomach is within normal limits. Appendix appears normal. No evidence of bowel wall thickening, distention, or inflammatory changes. Vascular/Lymphatic: Aortic atherosclerosis. No enlarged abdominal or pelvic lymph nodes. Reproductive: Prostate is unremarkable. Other: No ascites, focal collection or pneumoperitoneum. Bilateral inguinal hernias containing fat again noted. Musculoskeletal: No acute abnormalities identified. Heterogeneity of the visualized spine is unchanged with mild L1 compression again noted. Increased density within both femoral heads likely represent AVN without flattening or fracture. IMPRESSION: 1. No evidence of acute abnormality 2. Cardiomegaly, aortic valve replacement and heavy atherosclerotic vascular calcifications 3. Bilateral inguinal hernias containing fat 4. Question bilateral femoral head AVN.  No fracture or flattening. 5.  Aortic Atherosclerosis (ICD10-I70.0). Electronically Signed   By: Margarette Canada M.D.   On: 11/14/2017 18:11   Dg Chest 2 View  Result Date: 11/14/2017 CLINICAL DATA:  Initial evaluation for Eric epigastric pain in shortness breath for 2 weeks. EXAM: CHEST  2 VIEW COMPARISON:  Prior radiograph from 04/27/2017. FINDINGS: Dual lead right-sided pacemaker/ AICD in place with electrodes overlying the right atrium and right ventricle. Mild cardiomegaly, stable. Mediastinal silhouette normal. Aortic atherosclerosis. Lungs normally inflated. Minimal subsegmental atelectasis present at the lung bases. No focal infiltrates. No pulmonary edema or pleural effusion. No pneumothorax. No acute osseus abnormality. IMPRESSION: 1. No active cardiopulmonary disease. 2. Mild cardiomegaly. 3. Aortic  atherosclerosis. Electronically Signed   By: Jeannine Boga M.D.   On: 11/14/2017 13:15    Procedures Procedures (including critical care time)  Medications Ordered in ED Medications - No data to display   Initial Impression / Assessment and Plan / ED Course  I have reviewed the triage vital signs and the nursing notes.  Pertinent labs & imaging results that were available during my care of the patient were reviewed by me and considered in my  medical decision making (see chart for details).    She has a history of multivessel disease.  Last time we decided not to do any intervention and stated that if his symptoms worsen that they would likely do it.  Symptoms here today are not entirely typical for ACS however it is associated shortness of breath and nausea and he does have some orthopnea so we will ask cardiology to see him for recommendations on further workup and management of the chest pain. Cardiology seen and doesn't think it is cardiac. Repeat troponin similar to baseline.  Considered GI cause, CT ok for emergent causes, will ask to follow up with GI and PCP for further recommendations andmanagement.   Final Clinical Impressions(s) / ED Diagnoses   Final diagnoses:  Epigastric pain    ED Discharge Orders    None       Tamarcus Condie, Corene Cornea, MD 11/14/17 [phone]

## 2017-11-21 ENCOUNTER — Ambulatory Visit (INDEPENDENT_AMBULATORY_CARE_PROVIDER_SITE_OTHER): Payer: Medicare Other | Admitting: Pharmacist

## 2017-11-21 DIAGNOSIS — Z5181 Encounter for therapeutic drug level monitoring: Secondary | ICD-10-CM | POA: Diagnosis not present

## 2017-11-21 DIAGNOSIS — Z952 Presence of prosthetic heart valve: Secondary | ICD-10-CM

## 2017-11-21 LAB — POCT INR: INR: 2.2

## 2017-11-21 MED ORDER — WARFARIN SODIUM 5 MG PO TABS: ORAL_TABLET | ORAL | 0 refills | Status: DC

## 2017-11-21 NOTE — Patient Instructions (Signed)
Description   Continue taking 1and 1/2 tablets every day except 1 tablet on Tuesday, Thursday, and Saturday.  Recheck INR in 4 weeks  Call coumadin clinic with any questions concerns any new medications or if scheduled for any new procedures  [phone]

## 2017-12-19 ENCOUNTER — Ambulatory Visit (INDEPENDENT_AMBULATORY_CARE_PROVIDER_SITE_OTHER): Payer: Medicare Other | Admitting: *Deleted

## 2017-12-19 DIAGNOSIS — I4891 Unspecified atrial fibrillation: Secondary | ICD-10-CM

## 2017-12-19 DIAGNOSIS — Z952 Presence of prosthetic heart valve: Secondary | ICD-10-CM | POA: Diagnosis not present

## 2017-12-19 DIAGNOSIS — Z5181 Encounter for therapeutic drug level monitoring: Secondary | ICD-10-CM | POA: Diagnosis not present

## 2017-12-19 LAB — POCT INR: INR: 2.5

## 2017-12-19 NOTE — Patient Instructions (Signed)
Description   Continue taking 1 and 1/2 tablets every day except 1 tablet on Tuesday, Thursday, and Saturday.  Recheck INR in 6 weeks.  Call coumadin clinic with any questions concerns any new medications or if scheduled for any new procedures  [phone]

## 2018-01-08 ENCOUNTER — Telehealth: Payer: Self-pay | Admitting: Physician Assistant

## 2018-01-08 NOTE — Telephone Encounter (Signed)
Spoke with patient's wife and gave her Dr Marlou Porch recommendations..  They will DC Coumadin and Isordil and monitor itching,  monitor device if he should go into Afib rhythm and continue ASA 81mg  daily.  She verbalized understanding.

## 2018-01-08 NOTE — Telephone Encounter (Signed)
Spoke with patient in regards to itching and scratching after starting Isordil and Coumadin... Isordil 20mg  was started on 10/04/17 and Coumadin 5mg  on 11/21/17.  The itching came on after the Coumadin was started, benadryl relieved it a bit, but it has been real intense the past 2.5 weeks and he is scratching intensely. Please advise.  Thank you.

## 2018-01-08 NOTE — Telephone Encounter (Signed)
Stop the Coumadin and Isordil. Continue to monitor device for any further evidence of AFIB. Currently none.  Make sure he is on ASA 81.  Candee Furbish, MD

## 2018-01-08 NOTE — Telephone Encounter (Signed)
New Message   Pt c/o medication issue:  1. Name of Medication: warfarin (COUMADIN) 5 MG tablet and isosorbide dinitrate (ISORDIL) 20 MG tablet 2. How are you currently taking this medication (dosage and times per day)? isosorbide dinitrate (ISORDIL) 20 MG tablet Take 1 tablet (20 mg total) 3 (three) times daily by mouth.  3. Are you having a reaction (difficulty breathing--STAT)? hives  4. What is your medication issue? Patients wife is calling stating that the patient has started breaking out in hives when he takes the medications. Not sure which one is causing the hives. Please call to [facility].

## 2018-01-13 ENCOUNTER — Telehealth: Payer: Self-pay | Admitting: Internal Medicine

## 2018-01-13 NOTE — Telephone Encounter (Signed)
Per wife, rash continues and is about the same as before. Per wife, patient does not show any signs of improvement or worsening symptoms. C/O continued itching on arms, legs, chest, back, neck, abdomen and raised whelps like hives with benadryl. No c/o fever. Per wife, patient has been holding isordil and warfarin since the 13th of February. Per wife, she did admit changing detergents a few days ago, but after the patient started warfarin. No change in soaps, lotions or cologne. Wife advised that since there was no improvement with the rash after stopping isordil and warfarin, that it is less likely the cause. Patient is seeing his PCP tomorrow and will address this further with his office. Wife advised that message would be sent to Dr. Marlou Porch for instructions about restarting isordil and warfarin. Wife verbalized understanding of plan.

## 2018-01-13 NOTE — Telephone Encounter (Signed)
New Message     Patient wife is calling back, they called last week about her husband having a allergic reaction to medication and they stopped his warafrin and isosorbide last week and still has the rash, it maybe one of the other medication he is taking    Pt c/o medication issue:  1. Name of Medication:  omdanfetron 8mg  (generic for zofran)  atorvastatin (LIPITOR) 40 MG tablet Take 2 tablets (80 mg total) by mouth daily.    cinacalcet (SENSIPAR) 30 MG tablet Take 30 mg by mouth daily with supper   metoprolol tartrate (LOPRESSOR) 25 MG tablet Take 1 tablet (25 mg total) by mouth 2 (two) times daily.     2. How are you currently taking this medication (dosage and times per day)? As prescribed  3. Are you having a reaction (difficulty breathing--STAT)? yes  4. What is your medication issue? Rash and welts, she is giving him generic benadryl for the itching

## 2018-01-16 NOTE — Telephone Encounter (Signed)
Let's continue to stay off of isosorbide and warfarin. If his pacer interrogation shows any further atrial fibrillation episodes in the future, we can always resume the coumadin. For now continue ASA.   Candee Furbish, MD

## 2018-01-17 NOTE — Telephone Encounter (Signed)
Lm on VM to c/b for recommendations.

## 2018-01-17 NOTE — Telephone Encounter (Signed)
Reviewed orders and recommendations made by Dr Marlou Porch with patient's wife (on [place])  She states understanding.  She reports pt was seen by his PCP and dx with scabies.  He is being treated for this at the present time.

## 2018-01-30 ENCOUNTER — Ambulatory Visit (INDEPENDENT_AMBULATORY_CARE_PROVIDER_SITE_OTHER): Payer: Medicare Other | Admitting: *Deleted

## 2018-01-30 DIAGNOSIS — I442 Atrioventricular block, complete: Secondary | ICD-10-CM

## 2018-01-30 LAB — CUP PACEART REMOTE DEVICE CHECK
Date Time Interrogation Session: 20190324151547
Implantable Lead Implant Date: 20160815
Implantable Lead Implant Date: 20160815
Implantable Lead Model: 5076
MDC IDC LEAD LOCATION: 753859
MDC IDC LEAD LOCATION: 753860
MDC IDC PG IMPLANT DT: 20160815
MDC IDC PG SERIAL: 68600861

## 2018-01-30 NOTE — Progress Notes (Signed)
Remote pacemaker transmission.   

## 2018-01-31 ENCOUNTER — Encounter: Payer: Self-pay | Admitting: Cardiology

## 2018-02-18 ENCOUNTER — Encounter: Payer: Self-pay | Admitting: Internal Medicine

## 2018-02-18 ENCOUNTER — Ambulatory Visit (INDEPENDENT_AMBULATORY_CARE_PROVIDER_SITE_OTHER): Payer: Medicare Other | Admitting: Internal Medicine

## 2018-02-18 VITALS — BP 144/60 | HR 68 | Ht 71.0 in | Wt 241.0 lb

## 2018-02-18 DIAGNOSIS — I442 Atrioventricular block, complete: Secondary | ICD-10-CM | POA: Diagnosis not present

## 2018-02-18 DIAGNOSIS — I5022 Chronic systolic (congestive) heart failure: Secondary | ICD-10-CM

## 2018-02-18 DIAGNOSIS — Z950 Presence of cardiac pacemaker: Secondary | ICD-10-CM

## 2018-02-18 NOTE — Patient Instructions (Addendum)
    Medication Instructions: Your physician recommends that you continue on your current medications as directed. Please refer to the Current Medication list given to you today.  Labwork: None Ordered  Procedures/Testing: None Ordered  Follow-Up: Your physician wants you to follow-up in: 1 YEAR with Tommye Standard, PA. You will receive a reminder letter in the mail two months in advance. If you don't receive a letter, please call our office to schedule the follow-up appointment.  Remote monitoring is used to monitor your Pacemaker from home. This monitoring reduces the number of office visits required to check your device to one time per year. It allows [place] to keep an eye on the functioning of your device to ensure it is working properly. You are scheduled for a device check from home on 05/01/18. You may send your transmission at any time that day. If you have a wireless device, the transmission will be sent automatically. After your physician reviews your transmission, you will receive a postcard with your next transmission date.   If you need a refill on your cardiac medications before your next appointment, please call your pharmacy.       If you need a refill on your cardiac medications before your next appointment, please call your pharmacy.

## 2018-02-18 NOTE — Progress Notes (Signed)
Patient Care Team: [name], Dianna Rossetti, MD as PCP - General (Internal Medicine) Deboraha Sprang, MD as PCP - Cardiology (Cardiology) Fleet Contras, MD as Consulting Physician (Nephrology)   HPI  Jeffrey [name] is a 77 y.o. male Seen in follow-up for pacemaker implanted initially for intermittent complete heart block and then complete heart block complicating Taber  He has known coronary disease..>>>"with coronary calcification and total occlusion of the proximal to mid RCA with extensive left-to-right collaterals; 50 - 60% stenosis in the proximal LAD after the first diagonal branch, and 30% mid AV groove circumflex stenoses.  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    He has moderate aortic stenosis in the context of end-stage renal disease and poorly controlled blood pressure.  He had been having bradycardia associated chest pain during dialysis. The latter resolves with normalization of heart rate   Underwent TAVR. 9/1  This was associated with significant interval improvement. He received a sapien 3 and Which was complicated by CHB      Catheterization 11/15 demonstrated an occluded right coronary artery with left-right collateral LVEF post TAVR 55-60% 8/17 Echo   Exercise tolerance is modest.  He is limited more by his knees.  He is undergoing dialysis and tolerated it reasonably well.  He has had atrial fibrillation and he was started on Coumadin.  This was discontinued because of a rash.  He is on low-dose aspirin.  Thromboembolic risk factors ( age  -2, HTN-1, TIA/CVA-2, DM-1, Vasc disease -1) for a CHADSVASc Score of 7     Past Medical History:  Diagnosis Date  . Anginal pain ([place])   . Aortic stenosis    a. severe by echo 09/2014  . Atrial fibrillation ([place])    a. not well documented, not on anticoagulation  . CHF (congestive heart failure) ([place])    04/28/17 echo-EF  40%, mod LVH, diastolic dysfunction  . Claustrophobia   . Complete heart block ([place])   . COPD (chronic obstructive pulmonary disease) ([place])   . Coronary artery disease    a. chronically occluded RCA per cath 09/2014 with collaterals B. cath 05/01/17 chr occ RCA w/collaterals, 60-70% mid LAD,   . CVA (cerebral vascular accident) ([place]) 10/2014   denies residual on 07/11/2015  . ESRD (end stage renal disease) on dialysis [facility])    a. on dialysis; [facility]; MWF, LUE fistula (07/11/2015)  . History of blood transfusion    "related to gallbladder OR"  . History of stomach ulcers   . Hyperlipidemia   . Hypertension   . Iron deficiency anemia   . Myocardial infarction ([place]) 10/2014  . Peripheral vascular disease ([place])   . Pneumonia   . Presence of permanent cardiac pacemaker   . Renal insufficiency   . 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 Type II diabetes mellitus ([place])     Past Surgical History:  Procedure Laterality Date  . AV FISTULA PLACEMENT Left 10/19/2014   Procedure: BRACHIOCEPHALIC ARTERIOVENOUS (AV) FISTULA CREATION ;  Surgeon: Conrad Frewsburg, MD;  Location: [place];  Service: Vascular;  Laterality: Left;  . CARDIAC CATHETERIZATION    . CARDIAC CATHETERIZATION N/A 07/22/2015   Procedure: Right/Left Heart Cath and Coronary Angiography;  Surgeon: [name], MD;  Location: [place] CV LAB;  Service: Cardiovascular;  Laterality: N/A;  . CATARACT EXTRACTION W/ INTRAOCULAR LENS  IMPLANT, BILATERAL Bilateral 1990's  . CHOLECYSTECTOMY OPEN  1980's  . COLONOSCOPY W/ BIOPSIES AND POLYPECTOMY    . CORONARY ANGIOPLASTY    . EP IMPLANTABLE DEVICE N/A 07/11/2015   Procedure: Pacemaker Implant;  Surgeon: [name], MD;  Location: [place] CV LAB;  Service: Cardiovascular;  Laterality: N/A;  . ESOPHAGOGASTRODUODENOSCOPY  08/01/2012   Procedure:  ESOPHAGOGASTRODUODENOSCOPY (EGD);  Surgeon: [name], MD;  Location[facility];  Service: Endoscopy;  Laterality: N/A;  . INSERT / REPLACE / REMOVE PACEMAKER  07/11/2015  . INSERTION OF DIALYSIS CATHETER Right 02/02/2015   Procedure: INSERTION OF DIALYSIS CATHETER  RIGHT INTERNAL JUGULAR;  Surgeon: Mal [name], MD;  Location: [place];  Service: Vascular;  Laterality: Right;  . LEFT AND RIGHT HEART CATHETERIZATION WITH CORONARY ANGIOGRAM N/A 09/30/2014   Procedure: LEFT AND RIGHT HEART CATHETERIZATION WITH CORONARY ANGIOGRAM;  Surgeon: [name], MD;  Location: [facility] CATH LAB;  Service: Cardiovascular;  Laterality: N/A;  . TEE WITHOUT CARDIOVERSION N/A 08/02/2015   Procedure: TRANSESOPHAGEAL ECHOCARDIOGRAM (TEE);  Surgeon: [name], MD;  Location: [place];  Service: Open Heart Surgery;  Laterality: N/A;  . TONSILLECTOMY    . TRANSCATHETER AORTIC VALVE REPLACEMENT, TRANSFEMORAL Left 08/02/2015   Procedure: TRANSCATHETER AORTIC VALVE REPLACEMENT, TRANSFEMORAL;  Surgeon: [name], MD;  Location: [place];  Service: Open Heart Surgery;  Laterality: Left;    Current Outpatient Medications  Medication Sig Dispense Refill  . aspirin EC 81 MG tablet Take 1 tablet (81 mg total) by mouth daily. 90 tablet 3  . atorvastatin (LIPITOR) 40 MG tablet Take 2 tablets (80 mg total) by mouth daily. 1 tablet 0  . cinacalcet (SENSIPAR) 30 MG tablet Take 30 mg by mouth daily with supper.    . insulin detemir (LEVEMIR) 100 UNIT/ML injection Inject 0.48 mLs (48 Units total) into the skin at bedtime. 10 mL 0  . lidocaine-prilocaine (EMLA) cream Apply 1 application topically once as needed (prior to accessing port).     . metoprolol tartrate (LOPRESSOR) 25 MG tablet Take 1 tablet (25 mg total) by mouth 2 (two) times daily. 60 tablet 0  . nitroGLYCERIN (NITROSTAT) 0.4 MG SL tablet Place 0.4 mg under the tongue every 5 (five) minutes as needed for chest pain.     . ranitidine (ZANTAC) 150 MG tablet Take 150 mg by  mouth 2 (two) times daily as needed for heartburn.     No current facility-administered medications for this visit.     Allergies  Allergen Reactions  . Byetta 10 Mcg Pen [Exenatide] Diarrhea and Nausea And Vomiting  . Codeine Itching    Review of Systems negative except from HPI and PMH  Physical Exam BP (!) 144/60   Pulse 68   Ht 5\' 11"  (1.803 m)   Wt 241 lb (109.3 kg)   SpO2 98%   BMI 33.61 kg/m  Well developed and nourished in no acute distress HENT normal Neck supple with JVP-flat Clear Regular rate and rhythm, n2/6 M  Abd-soft with active BS No Clubbing cyanosis edema  Graft on L arm Skin-warm and dry A & Oriented  Grossly normal sensory and motor function   ECG was ordered today AV pacing with freq PVCs  18/23/51  Assessment and  Plan  Complete heart block    Coronary artery disease  Pacemaker Biotronik  Aortic stenosis s/p TAVR  Ventricular Tachycardia  End-stage renal disease  Atrial  fibrillation-flutter  Persistent    Pt has been taken off anticoagulation 2/2 presumptive rash--he is on aspirin which is probably reasonable given his Taber and his coronary artery disease  He is not protective for his thromboembolic risk.  Data from [place] suggested apixaban may be even better than warfarin in this cohort.  Will defer to his primary physicians should be noted also that in the cohort of  ESRD patients presence of a prior stroke seems to be the trigger to justify the risks of anticoagulation.  No intercurrent Ventricular tachycardia  Afib intermittent    We spent more than 50% of our >25 min visit in face to face counseling regarding the above

## 2018-02-19 LAB — CUP PACEART INCLINIC DEVICE CHECK
Implantable Lead Implant Date: 20160815
Implantable Lead Implant Date: 20160815
Implantable Lead Location: 753859
Implantable Lead Location: 753860
Implantable Lead Model: 5076
Lead Channel Pacing Threshold Amplitude: 0.9 V
Lead Channel Pacing Threshold Amplitude: 0.9 V
Lead Channel Pacing Threshold Pulse Width: 0.4 ms
Lead Channel Pacing Threshold Pulse Width: 0.4 ms
Lead Channel Sensing Intrinsic Amplitude: 2.9 mV
Lead Channel Sensing Intrinsic Amplitude: 2.9 mV
MDC IDC MSMT LEADCHNL RA IMPEDANCE VALUE: 448 Ohm
MDC IDC MSMT LEADCHNL RA PACING THRESHOLD PULSEWIDTH: 0.4 ms
MDC IDC MSMT LEADCHNL RV IMPEDANCE VALUE: 409 Ohm
MDC IDC MSMT LEADCHNL RV PACING THRESHOLD AMPLITUDE: 0.8 V
MDC IDC MSMT LEADCHNL RV SENSING INTR AMPL: 16.4 mV
MDC IDC MSMT LEADCHNL RV SENSING INTR AMPL: 16.4 mV
MDC IDC PG IMPLANT DT: 20160815
MDC IDC SESS DTM: 20190326201000
MDC IDC SET LEADCHNL RA PACING AMPLITUDE: 2 V
MDC IDC SET LEADCHNL RV PACING AMPLITUDE: 2 V
MDC IDC SET LEADCHNL RV PACING PULSEWIDTH: 0.4 ms
MDC IDC SET LEADCHNL RV SENSING SENSITIVITY: 2 mV
Pulse Gen Serial Number: 68600861

## 2018-04-14 ENCOUNTER — Encounter (HOSPITAL_COMMUNITY)
Admission: EM | Disposition: A | Discharge status: Home / Self Care | Payer: Self-pay | Source: Home / Self Care | Attending: Internal Medicine

## 2018-04-14 ENCOUNTER — Emergency Department (HOSPITAL_COMMUNITY): Payer: Medicare Other

## 2018-04-14 ENCOUNTER — Other Ambulatory Visit: Payer: Self-pay

## 2018-04-14 ENCOUNTER — Encounter (HOSPITAL_COMMUNITY): Payer: Self-pay | Admitting: Emergency Medicine

## 2018-04-14 ENCOUNTER — Inpatient Hospital Stay (HOSPITAL_COMMUNITY)
Admission: EM | Admit: 2018-04-14 | Discharge: 2018-04-19 | DRG: 246 | Disposition: A | Discharge status: Home / Self Care | Payer: Medicare Other | Attending: Internal Medicine | Admitting: Internal Medicine

## 2018-04-14 DIAGNOSIS — Z952 Presence of prosthetic heart valve: Secondary | ICD-10-CM

## 2018-04-14 DIAGNOSIS — R079 Chest pain, unspecified: Secondary | ICD-10-CM | POA: Diagnosis present

## 2018-04-14 DIAGNOSIS — I10 Essential (primary) hypertension: Secondary | ICD-10-CM | POA: Diagnosis not present

## 2018-04-14 DIAGNOSIS — F40240 Claustrophobia: Secondary | ICD-10-CM | POA: Diagnosis present

## 2018-04-14 DIAGNOSIS — I5042 Chronic combined systolic (congestive) and diastolic (congestive) heart failure: Secondary | ICD-10-CM | POA: Diagnosis present

## 2018-04-14 DIAGNOSIS — I350 Nonrheumatic aortic (valve) stenosis: Secondary | ICD-10-CM | POA: Diagnosis present

## 2018-04-14 DIAGNOSIS — Z9111 Patient's noncompliance with dietary regimen: Secondary | ICD-10-CM

## 2018-04-14 DIAGNOSIS — D638 Anemia in other chronic diseases classified elsewhere: Secondary | ICD-10-CM | POA: Diagnosis present

## 2018-04-14 DIAGNOSIS — Z992 Dependence on renal dialysis: Secondary | ICD-10-CM | POA: Diagnosis not present

## 2018-04-14 DIAGNOSIS — I2584 Coronary atherosclerosis due to calcified coronary lesion: Secondary | ICD-10-CM | POA: Diagnosis present

## 2018-04-14 DIAGNOSIS — N2581 Secondary hyperparathyroidism of renal origin: Secondary | ICD-10-CM | POA: Diagnosis present

## 2018-04-14 DIAGNOSIS — E785 Hyperlipidemia, unspecified: Secondary | ICD-10-CM | POA: Diagnosis present

## 2018-04-14 DIAGNOSIS — Z79899 Other long term (current) drug therapy: Secondary | ICD-10-CM

## 2018-04-14 DIAGNOSIS — I2510 Atherosclerotic heart disease of native coronary artery without angina pectoris: Secondary | ICD-10-CM | POA: Diagnosis present

## 2018-04-14 DIAGNOSIS — I249 Acute ischemic heart disease, unspecified: Secondary | ICD-10-CM

## 2018-04-14 DIAGNOSIS — I482 Chronic atrial fibrillation: Secondary | ICD-10-CM | POA: Diagnosis present

## 2018-04-14 DIAGNOSIS — I25110 Atherosclerotic heart disease of native coronary artery with unstable angina pectoris: Secondary | ICD-10-CM | POA: Diagnosis not present

## 2018-04-14 DIAGNOSIS — N186 End stage renal disease: Secondary | ICD-10-CM | POA: Diagnosis present

## 2018-04-14 DIAGNOSIS — E8889 Other specified metabolic disorders: Secondary | ICD-10-CM | POA: Diagnosis present

## 2018-04-14 DIAGNOSIS — Z8673 Personal history of transient ischemic attack (TIA), and cerebral infarction without residual deficits: Secondary | ICD-10-CM

## 2018-04-14 DIAGNOSIS — E1122 Type 2 diabetes mellitus with diabetic chronic kidney disease: Secondary | ICD-10-CM | POA: Diagnosis present

## 2018-04-14 DIAGNOSIS — I214 Non-ST elevation (NSTEMI) myocardial infarction: Secondary | ICD-10-CM | POA: Diagnosis not present

## 2018-04-14 DIAGNOSIS — Z950 Presence of cardiac pacemaker: Secondary | ICD-10-CM

## 2018-04-14 DIAGNOSIS — Z9049 Acquired absence of other specified parts of digestive tract: Secondary | ICD-10-CM

## 2018-04-14 DIAGNOSIS — Z6834 Body mass index (BMI) 34.0-34.9, adult: Secondary | ICD-10-CM

## 2018-04-14 DIAGNOSIS — I441 Atrioventricular block, second degree: Secondary | ICD-10-CM | POA: Diagnosis present

## 2018-04-14 DIAGNOSIS — I132 Hypertensive heart and chronic kidney disease with heart failure and with stage 5 chronic kidney disease, or end stage renal disease: Secondary | ICD-10-CM | POA: Diagnosis present

## 2018-04-14 DIAGNOSIS — Z7982 Long term (current) use of aspirin: Secondary | ICD-10-CM

## 2018-04-14 DIAGNOSIS — I25119 Atherosclerotic heart disease of native coronary artery with unspecified angina pectoris: Secondary | ICD-10-CM | POA: Diagnosis present

## 2018-04-14 DIAGNOSIS — Z95810 Presence of automatic (implantable) cardiac defibrillator: Secondary | ICD-10-CM

## 2018-04-14 DIAGNOSIS — J449 Chronic obstructive pulmonary disease, unspecified: Secondary | ICD-10-CM | POA: Diagnosis present

## 2018-04-14 DIAGNOSIS — Z8249 Family history of ischemic heart disease and other diseases of the circulatory system: Secondary | ICD-10-CM

## 2018-04-14 DIAGNOSIS — I252 Old myocardial infarction: Secondary | ICD-10-CM

## 2018-04-14 DIAGNOSIS — R748 Abnormal levels of other serum enzymes: Secondary | ICD-10-CM | POA: Diagnosis not present

## 2018-04-14 DIAGNOSIS — E11649 Type 2 diabetes mellitus with hypoglycemia without coma: Secondary | ICD-10-CM

## 2018-04-14 DIAGNOSIS — Z953 Presence of xenogenic heart valve: Secondary | ICD-10-CM

## 2018-04-14 DIAGNOSIS — R21 Rash and other nonspecific skin eruption: Secondary | ICD-10-CM | POA: Diagnosis present

## 2018-04-14 DIAGNOSIS — E669 Obesity, unspecified: Secondary | ICD-10-CM | POA: Diagnosis present

## 2018-04-14 DIAGNOSIS — L299 Pruritus, unspecified: Secondary | ICD-10-CM | POA: Diagnosis present

## 2018-04-14 DIAGNOSIS — Z833 Family history of diabetes mellitus: Secondary | ICD-10-CM

## 2018-04-14 DIAGNOSIS — T45515A Adverse effect of anticoagulants, initial encounter: Secondary | ICD-10-CM | POA: Diagnosis present

## 2018-04-14 DIAGNOSIS — R0902 Hypoxemia: Secondary | ICD-10-CM | POA: Diagnosis present

## 2018-04-14 DIAGNOSIS — Z87891 Personal history of nicotine dependence: Secondary | ICD-10-CM

## 2018-04-14 DIAGNOSIS — R778 Other specified abnormalities of plasma proteins: Secondary | ICD-10-CM

## 2018-04-14 DIAGNOSIS — T380X5A Adverse effect of glucocorticoids and synthetic analogues, initial encounter: Secondary | ICD-10-CM | POA: Diagnosis present

## 2018-04-14 DIAGNOSIS — Z794 Long term (current) use of insulin: Secondary | ICD-10-CM

## 2018-04-14 DIAGNOSIS — R7989 Other specified abnormal findings of blood chemistry: Secondary | ICD-10-CM

## 2018-04-14 DIAGNOSIS — R0602 Shortness of breath: Secondary | ICD-10-CM

## 2018-04-14 DIAGNOSIS — I959 Hypotension, unspecified: Secondary | ICD-10-CM | POA: Diagnosis not present

## 2018-04-14 DIAGNOSIS — I480 Paroxysmal atrial fibrillation: Secondary | ICD-10-CM | POA: Diagnosis present

## 2018-04-14 DIAGNOSIS — I4891 Unspecified atrial fibrillation: Secondary | ICD-10-CM | POA: Diagnosis present

## 2018-04-14 DIAGNOSIS — E1151 Type 2 diabetes mellitus with diabetic peripheral angiopathy without gangrene: Secondary | ICD-10-CM | POA: Diagnosis present

## 2018-04-14 DIAGNOSIS — Z9981 Dependence on supplemental oxygen: Secondary | ICD-10-CM

## 2018-04-14 DIAGNOSIS — Z955 Presence of coronary angioplasty implant and graft: Secondary | ICD-10-CM

## 2018-04-14 DIAGNOSIS — I272 Pulmonary hypertension, unspecified: Secondary | ICD-10-CM | POA: Diagnosis present

## 2018-04-14 HISTORY — PX: LEFT HEART CATH AND CORONARY ANGIOGRAPHY: CATH118249

## 2018-04-14 LAB — GLUCOSE, CAPILLARY
GLUCOSE-CAPILLARY: 180 mg/dL — AB (ref 65–99)
Glucose-Capillary: 124 mg/dL — ABNORMAL HIGH (ref 65–99)
Glucose-Capillary: 112 mg/dL — ABNORMAL HIGH (ref 65–99)

## 2018-04-14 LAB — HEMOGLOBIN A1C
HEMOGLOBIN A1C: 7.4 % — AB (ref 4.8–5.6)
Mean Plasma Glucose: 165.68 mg/dL

## 2018-04-14 LAB — I-STAT TROPONIN, ED: TROPONIN I, POC: 0.14 ng/mL — AB (ref 0.00–0.08)

## 2018-04-14 LAB — CBC
HEMATOCRIT: 31 % — AB (ref 39.0–52.0)
Hemoglobin: 10 g/dL — ABNORMAL LOW (ref 13.0–17.0)
MCH: 30.5 pg (ref 26.0–34.0)
MCHC: 32.3 g/dL (ref 30.0–36.0)
MCV: 94.5 fL (ref 78.0–100.0)
Platelets: 174 10*3/uL (ref 150–400)
RBC: 3.28 MIL/uL — AB (ref 4.22–5.81)
RDW: 14 % (ref 11.5–15.5)
WBC: 7.7 10*3/uL (ref 4.0–10.5)

## 2018-04-14 LAB — BASIC METABOLIC PANEL
ANION GAP: 15 (ref 5–15)
BUN: 61 mg/dL — ABNORMAL HIGH (ref 6–20)
CALCIUM: 8.8 mg/dL — AB (ref 8.9–10.3)
CHLORIDE: 97 mmol/L — AB (ref 101–111)
CO2: 22 mmol/L (ref 22–32)
Creatinine, Ser: 6.59 mg/dL — ABNORMAL HIGH (ref 0.61–1.24)
GFR calc non Af Amer: 7 mL/min — ABNORMAL LOW (ref >60)
GFR, EST AFRICAN AMERICAN: 8 mL/min — AB (ref >60)
Glucose, Bld: 233 mg/dL — ABNORMAL HIGH (ref 65–99)
POTASSIUM: 4.5 mmol/L (ref 3.5–5.1)
Sodium: 134 mmol/L — ABNORMAL LOW (ref 135–145)

## 2018-04-14 LAB — TROPONIN I
TROPONIN I: 0.36 ng/mL — AB (ref <0.03)
TROPONIN I: 0.53 ng/mL — AB (ref <0.03)

## 2018-04-14 LAB — HEPARIN LEVEL (UNFRACTIONATED): Heparin Unfractionated: 0.17 IU/mL — ABNORMAL LOW (ref 0.30–0.70)

## 2018-04-14 LAB — TSH: TSH: 3.286 u[IU]/mL (ref 0.350–4.500)

## 2018-04-14 LAB — CBG MONITORING, ED: Glucose-Capillary: 125 mg/dL — ABNORMAL HIGH (ref 65–99)

## 2018-04-14 LAB — MRSA PCR SCREENING: MRSA BY PCR: NEGATIVE

## 2018-04-14 LAB — POCT ACTIVATED CLOTTING TIME: Activated Clotting Time: 147 seconds

## 2018-04-14 SURGERY — LEFT HEART CATH AND CORONARY ANGIOGRAPHY
Anesthesia: LOCAL

## 2018-04-14 MED ORDER — HEPARIN BOLUS VIA INFUSION
4000.0000 [IU] | Freq: Once | INTRAVENOUS | Status: AC
  Administered 2018-04-14: 4000 [IU] via INTRAVENOUS
  Filled 2018-04-14: qty 4000

## 2018-04-14 MED ORDER — CINACALCET HCL 30 MG PO TABS
30.0000 mg | ORAL_TABLET | Freq: Every day | ORAL | Status: DC
  Administered 2018-04-15 – 2018-04-17 (×3): 30 mg via ORAL
  Filled 2018-04-14 (×3): qty 1

## 2018-04-14 MED ORDER — INSULIN ASPART 100 UNIT/ML ~~LOC~~ SOLN
0.0000 [IU] | Freq: Every day | SUBCUTANEOUS | Status: DC
  Administered 2018-04-15: 3 [IU] via SUBCUTANEOUS

## 2018-04-14 MED ORDER — ASPIRIN 81 MG PO CHEW
324.0000 mg | CHEWABLE_TABLET | Freq: Once | ORAL | Status: AC
  Administered 2018-04-14: 81 mg via ORAL
  Filled 2018-04-14: qty 4

## 2018-04-14 MED ORDER — SODIUM CHLORIDE 0.9% FLUSH: 3.0000 mL | INTRAVENOUS | Status: DC | PRN

## 2018-04-14 MED ORDER — ASPIRIN EC 81 MG PO TBEC
81.0000 mg | DELAYED_RELEASE_TABLET | Freq: Every day | ORAL | Status: DC
  Administered 2018-04-15 – 2018-04-17 (×3): 81 mg via ORAL
  Filled 2018-04-14 (×3): qty 1

## 2018-04-14 MED ORDER — METOPROLOL TARTRATE 25 MG PO TABS
25.0000 mg | ORAL_TABLET | Freq: Two times a day (BID) | ORAL | Status: DC
  Administered 2018-04-14 – 2018-04-19 (×6): 25 mg via ORAL
  Filled 2018-04-14 (×7): qty 1

## 2018-04-14 MED ORDER — INSULIN ASPART 100 UNIT/ML ~~LOC~~ SOLN
0.0000 [IU] | Freq: Three times a day (TID) | SUBCUTANEOUS | Status: DC
  Administered 2018-04-14: 2 [IU] via SUBCUTANEOUS
  Administered 2018-04-15: 8 [IU] via SUBCUTANEOUS
  Administered 2018-04-16: 2 [IU] via SUBCUTANEOUS
  Administered 2018-04-16: 8 [IU] via SUBCUTANEOUS
  Administered 2018-04-16 – 2018-04-17 (×2): 3 [IU] via SUBCUTANEOUS
  Administered 2018-04-17 – 2018-04-18 (×2): 5 [IU] via SUBCUTANEOUS
  Administered 2018-04-19: 8 [IU] via SUBCUTANEOUS
  Administered 2018-04-19: 15 [IU] via SUBCUTANEOUS
  Filled 2018-04-14: qty 1

## 2018-04-14 MED ORDER — ACETAMINOPHEN 325 MG PO TABS
650.0000 mg | ORAL_TABLET | ORAL | Status: DC | PRN
  Administered 2018-04-18: 650 mg via ORAL
  Filled 2018-04-14: qty 2

## 2018-04-14 MED ORDER — TRIAMCINOLONE 0.1 % CREAM:EUCERIN CREAM 1:1
TOPICAL_CREAM | Freq: Three times a day (TID) | CUTANEOUS | Status: DC
  Administered 2018-04-15: 1 via TOPICAL
  Administered 2018-04-16 – 2018-04-17 (×4): via TOPICAL
  Filled 2018-04-14: qty 1

## 2018-04-14 MED ORDER — SODIUM CHLORIDE 0.9% FLUSH: 3.0000 mL | Freq: Two times a day (BID) | INTRAVENOUS | Status: DC

## 2018-04-14 MED ORDER — ATORVASTATIN CALCIUM 80 MG PO TABS: 80.0000 mg | ORAL_TABLET | Freq: Every day | ORAL | Status: DC

## 2018-04-14 MED ORDER — CALCIUM CARBONATE ANTACID 1250 MG/5ML PO SUSP
500.0000 mg | Freq: Four times a day (QID) | ORAL | Status: DC | PRN
  Administered 2018-04-18: 500 mg via ORAL
  Filled 2018-04-14: qty 5

## 2018-04-14 MED ORDER — SODIUM CHLORIDE 0.9 % IV SOLN: 250.0000 mL | INTRAVENOUS | Status: DC | PRN

## 2018-04-14 MED ORDER — SORBITOL 70 % SOLN: 30.0000 mL | Status: DC | PRN

## 2018-04-14 MED ORDER — DARBEPOETIN ALFA 60 MCG/0.3ML IJ SOSY
60.0000 ug | PREFILLED_SYRINGE | INTRAMUSCULAR | Status: DC
  Filled 2018-04-14: qty 0.3

## 2018-04-14 MED ORDER — FENTANYL CITRATE (PF) 100 MCG/2ML IJ SOLN
INTRAMUSCULAR | Status: AC
  Filled 2018-04-14: qty 2

## 2018-04-14 MED ORDER — NITROGLYCERIN IN D5W 200-5 MCG/ML-% IV SOLN
0.0000 ug/min | Freq: Once | INTRAVENOUS | Status: AC
  Administered 2018-04-14: 5 ug/min via INTRAVENOUS
  Filled 2018-04-14: qty 250

## 2018-04-14 MED ORDER — LIDOCAINE HCL (PF) 1 % IJ SOLN
INTRAMUSCULAR | Status: DC | PRN
  Administered 2018-04-14: 15 mL

## 2018-04-14 MED ORDER — SODIUM CHLORIDE 0.9% FLUSH
3.0000 mL | Freq: Two times a day (BID) | INTRAVENOUS | Status: DC
  Administered 2018-04-14 – 2018-04-16 (×3): 3 mL via INTRAVENOUS

## 2018-04-14 MED ORDER — INSULIN DETEMIR 100 UNIT/ML ~~LOC~~ SOLN
48.0000 [IU] | Freq: Every day | SUBCUTANEOUS | Status: DC
  Administered 2018-04-14 – 2018-04-16 (×3): 48 [IU] via SUBCUTANEOUS
  Filled 2018-04-14 (×4): qty 0.48

## 2018-04-14 MED ORDER — ZOLPIDEM TARTRATE 5 MG PO TABS: 5.0000 mg | ORAL_TABLET | Freq: Every evening | ORAL | Status: DC | PRN

## 2018-04-14 MED ORDER — DOXERCALCIFEROL 4 MCG/2ML IV SOLN
2.0000 ug | INTRAVENOUS | Status: DC
  Administered 2018-04-17: 2 ug via INTRAVENOUS
  Filled 2018-04-14 (×2): qty 2

## 2018-04-14 MED ORDER — CAMPHOR-MENTHOL 0.5-0.5 % EX LOTN
1.0000 "application " | TOPICAL_LOTION | Freq: Three times a day (TID) | CUTANEOUS | Status: DC | PRN
  Administered 2018-04-17: 1 via TOPICAL
  Filled 2018-04-14: qty 222

## 2018-04-14 MED ORDER — HEPARIN (PORCINE) IN NACL 100-0.45 UNIT/ML-% IJ SOLN
1600.0000 [IU]/h | INTRAMUSCULAR | Status: DC
Start: 2018-04-14 — End: 2018-04-14
  Administered 2018-04-14: 1400 [IU]/h via INTRAVENOUS
  Filled 2018-04-14 (×3): qty 250

## 2018-04-14 MED ORDER — LIDOCAINE HCL (PF) 1 % IJ SOLN
INTRAMUSCULAR | Status: AC
Start: 2018-04-14 — End: ?
  Filled 2018-04-14: qty 30

## 2018-04-14 MED ORDER — MIDAZOLAM HCL 2 MG/2ML IJ SOLN
INTRAMUSCULAR | Status: AC
  Filled 2018-04-14: qty 2

## 2018-04-14 MED ORDER — ATORVASTATIN CALCIUM 80 MG PO TABS
80.0000 mg | ORAL_TABLET | Freq: Every day | ORAL | Status: DC
  Administered 2018-04-14 – 2018-04-19 (×5): 80 mg via ORAL
  Filled 2018-04-14 (×5): qty 1

## 2018-04-14 MED ORDER — ACETAMINOPHEN 325 MG PO TABS: 650.0000 mg | ORAL_TABLET | ORAL | Status: DC | PRN

## 2018-04-14 MED ORDER — FAMOTIDINE 20 MG PO TABS
20.0000 mg | ORAL_TABLET | Freq: Two times a day (BID) | ORAL | Status: DC
  Administered 2018-04-14 – 2018-04-15 (×2): 20 mg via ORAL
  Filled 2018-04-14 (×2): qty 1

## 2018-04-14 MED ORDER — NEPRO/CARBSTEADY PO LIQD: 237.0000 mL | Freq: Three times a day (TID) | ORAL | Status: DC | PRN

## 2018-04-14 MED ORDER — IOHEXOL 350 MG/ML SOLN
INTRAVENOUS | Status: DC | PRN
  Administered 2018-04-14: 80 mL via INTRA_ARTERIAL

## 2018-04-14 MED ORDER — HEPARIN (PORCINE) IN NACL 100-0.45 UNIT/ML-% IJ SOLN
1700.0000 [IU]/h | INTRAMUSCULAR | Status: DC
  Administered 2018-04-15: 1500 [IU]/h via INTRAVENOUS
  Administered 2018-04-15: 1600 [IU]/h via INTRAVENOUS
  Administered 2018-04-16: 1550 [IU]/h via INTRAVENOUS
  Administered 2018-04-17: 1700 [IU]/h via INTRAVENOUS
  Administered 2018-04-17: 1650 [IU]/h via INTRAVENOUS
  Filled 2018-04-14 (×5): qty 250

## 2018-04-14 MED ORDER — HEPARIN BOLUS VIA INFUSION
3000.0000 [IU] | Freq: Once | INTRAVENOUS | Status: AC
  Administered 2018-04-14: 3000 [IU] via INTRAVENOUS
  Filled 2018-04-14: qty 3000

## 2018-04-14 MED ORDER — NITROGLYCERIN IN D5W 200-5 MCG/ML-% IV SOLN
0.0000 ug/min | INTRAVENOUS | Status: DC
  Administered 2018-04-14: 10 ug/min via INTRAVENOUS
  Administered 2018-04-17: 15 ug/min via INTRAVENOUS
  Filled 2018-04-14: qty 250

## 2018-04-14 MED ORDER — FENTANYL CITRATE (PF) 100 MCG/2ML IJ SOLN
INTRAMUSCULAR | Status: DC | PRN
  Administered 2018-04-14: 25 ug via INTRAVENOUS

## 2018-04-14 MED ORDER — DOCUSATE SODIUM 283 MG RE ENEM: 1.0000 | ENEMA | RECTAL | Status: DC | PRN

## 2018-04-14 MED ORDER — HEPARIN (PORCINE) IN NACL 2-0.9 UNITS/ML
INTRAMUSCULAR | Status: AC | PRN
  Administered 2018-04-14 (×2): 500 mL

## 2018-04-14 MED ORDER — ONDANSETRON HCL 4 MG/2ML IJ SOLN
4.0000 mg | Freq: Four times a day (QID) | INTRAMUSCULAR | Status: DC | PRN
  Administered 2018-04-17: 4 mg via INTRAVENOUS
  Filled 2018-04-14: qty 2

## 2018-04-14 MED ORDER — SODIUM CHLORIDE 0.9% FLUSH
3.0000 mL | Freq: Two times a day (BID) | INTRAVENOUS | Status: DC
  Administered 2018-04-14 – 2018-04-17 (×4): 3 mL via INTRAVENOUS

## 2018-04-14 MED ORDER — NITROGLYCERIN 2 % TD OINT
1.0000 [in_us] | TOPICAL_OINTMENT | Freq: Once | TRANSDERMAL | Status: AC
  Administered 2018-04-14: 1 [in_us] via TOPICAL
  Filled 2018-04-14: qty 1

## 2018-04-14 MED ORDER — HYDROXYZINE HCL 25 MG PO TABS
25.0000 mg | ORAL_TABLET | Freq: Three times a day (TID) | ORAL | Status: DC | PRN
  Administered 2018-04-17: 25 mg via ORAL
  Filled 2018-04-14: qty 1

## 2018-04-14 MED ORDER — ONDANSETRON HCL 4 MG/2ML IJ SOLN: 4.0000 mg | Freq: Four times a day (QID) | INTRAMUSCULAR | Status: DC | PRN

## 2018-04-14 MED ORDER — ASPIRIN 81 MG PO CHEW: 81.0000 mg | CHEWABLE_TABLET | ORAL | Status: DC

## 2018-04-14 MED ORDER — SODIUM CHLORIDE 0.9 % IV SOLN
INTRAVENOUS | Status: DC
  Administered 2018-04-14: 13:00:00 via INTRAVENOUS

## 2018-04-14 MED ORDER — MIDAZOLAM HCL 2 MG/2ML IJ SOLN
INTRAMUSCULAR | Status: DC | PRN
  Administered 2018-04-14: 2 mg via INTRAVENOUS

## 2018-04-14 MED ORDER — HEPARIN (PORCINE) IN NACL 1000-0.9 UT/500ML-% IV SOLN
INTRAVENOUS | Status: AC
  Filled 2018-04-14: qty 1000

## 2018-04-14 MED ORDER — LIDOCAINE HCL (PF) 1 % IJ SOLN
INTRAMUSCULAR | Status: AC
  Filled 2018-04-14: qty 30

## 2018-04-14 MED ORDER — ASPIRIN 81 MG PO CHEW: 81.0000 mg | CHEWABLE_TABLET | Freq: Every day | ORAL | Status: DC

## 2018-04-14 SURGICAL SUPPLY — 7 items
CATH INFINITI 5FR JL5 (CATHETERS) ×1 IMPLANT
CATH INFINITI 5FR MULTPACK ANG (CATHETERS) ×1 IMPLANT
HOVERMATT SINGLE USE (MISCELLANEOUS) ×1 IMPLANT
KIT HEART LEFT (KITS) ×2 IMPLANT
PACK CARDIAC CATHETERIZATION (CUSTOM PROCEDURE TRAY) ×2 IMPLANT
TRANSDUCER W/STOPCOCK (MISCELLANEOUS) ×2 IMPLANT
WIRE EMERALD 3MM-J .035X150CM (WIRE) ×1 IMPLANT

## 2018-04-14 NOTE — ED Notes (Signed)
EDP at bedside  

## 2018-04-14 NOTE — H&P (View-Only) (Signed)
Cardiology Consultation:   Patient ID: Jeffrey [name]; [id number]; 01-07-1941   Admit date: 04/14/2018 Date of Consult: 04/14/2018  Primary Care Provider: Thurman Coyer, MD Primary Cardiologist: [name], MD  Primary Electrophysiologist:     Patient Profile:   Jeffrey [name] is a 77 y.o. 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 Hal Hope.  History of Present Illness:   [name] has a history of CAD, last cath 2018 with CTO of RCA. He normally takes 1 nitro SL tablet once monthly for symptoms. Last evening at approximately 11:30 pm, he has a sudden onset of 8/10 substernal crushing chest pain that radiated to both shoulders and into his left neck and jaw. He had associated shortness of breath, but no diaphoresis or nausea. He took a nitro with relief for about 30 min. The pain returned and he took another nitro. He again felt relief, but this was short-lived. When the chest pain came back a third time, he decided to report to the ED. He had to take a third nitro to get in the car, he didn't think he would make it to the ER. On arrival, initial POC troponin was 0.14. EKG with paced rhythm. He was started on heparin drip and nitro drip and is now chest pain free.  He dialyzes on MWF. Mildly volume overloaded, not completely unexpected after the weekend and today being his normal HD day.   Past Medical History:  Diagnosis Date  . Anginal pain ([place])   . Aortic stenosis    a. severe by echo 09/2014  . Atrial fibrillation ([place])    a. not well documented, not on anticoagulation  . CHF (congestive heart failure) ([place])    04/28/17 echo-EF 40%, mod LVH, diastolic  dysfunction  . Claustrophobia   . Complete heart block ([place])   . COPD (chronic obstructive pulmonary disease) ([place])   . Coronary artery disease    a. chronically occluded RCA per cath 09/2014 with collaterals B. cath 05/01/17 chr occ RCA w/collaterals, 60-70% mid LAD,   . CVA (cerebral vascular accident) ([place]) 10/2014   denies residual on 07/11/2015  . ESRD (end stage renal disease) on dialysis [facility])    a. on dialysis; [facility]; MWF, LUE fistula (07/11/2015)  . History of blood transfusion    "related to gallbladder OR"  . History of stomach ulcers   . Hyperlipidemia   . Hypertension   . Iron deficiency anemia   . Myocardial infarction ([place]) 10/2014  . Peripheral vascular disease ([place])   . Pneumonia   . Presence of permanent cardiac pacemaker   . 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 Type II diabetes mellitus ([place])     Past Surgical History:  Procedure Laterality Date  . AV FISTULA PLACEMENT Left 10/19/2014   Procedure: BRACHIOCEPHALIC ARTERIOVENOUS (AV) FISTULA CREATION ;  Surgeon: Conrad Unionville, MD;  Location: [place];  Service: Vascular;  Laterality: Left;  . CARDIAC CATHETERIZATION    . CARDIAC CATHETERIZATION N/A 07/22/2015   Procedure: Right/Left  Heart Cath and Coronary Angiography;  Surgeon: [name], MD;  Location: [place] CV LAB;  Service: Cardiovascular;  Laterality: N/A;  . CATARACT EXTRACTION W/ INTRAOCULAR LENS  IMPLANT, BILATERAL Bilateral 1990's  . CHOLECYSTECTOMY OPEN  1980's  . COLONOSCOPY W/ BIOPSIES AND POLYPECTOMY    . CORONARY ANGIOPLASTY    . EP IMPLANTABLE DEVICE N/A 07/11/2015   Procedure: Pacemaker Implant;  Surgeon: [name], MD;  Location: [place] CV LAB;  Service: Cardiovascular;  Laterality: N/A;  . ESOPHAGOGASTRODUODENOSCOPY  08/01/2012   Procedure: ESOPHAGOGASTRODUODENOSCOPY (EGD);  Surgeon: [name], MD;  [facility];  Service: Endoscopy;  Laterality: N/A;  . INSERT / REPLACE / REMOVE PACEMAKER  07/11/2015  . INSERTION OF DIALYSIS CATHETER Right 02/02/2015   Procedure: INSERTION OF DIALYSIS CATHETER  RIGHT INTERNAL JUGULAR;  Surgeon: Mal [name], MD;  Location: [place];  Service: Vascular;  Laterality: Right;  . LEFT AND RIGHT HEART CATHETERIZATION WITH CORONARY ANGIOGRAM N/A 09/30/2014   Procedure: LEFT AND RIGHT HEART CATHETERIZATION WITH CORONARY ANGIOGRAM;  Surgeon: [name], MD;  [facility] C[facility] LAB;  Service: Cardiovascular;  Laterality: N/A;  . TEE WITHOUT CARDIOVERSION N/A 08/02/2015   Procedure: TRANSESOPHAGEAL ECHOCARDIOGRAM (TEE);  Surgeon: [name], MD;  Location: [place];  Service: Open Heart Surgery;  Laterality: N/A;  . TONSILLECTOMY    . TRANSCATHETER AORTIC VALVE REPLACEMENT, TRANSFEMORAL Left 08/02/2015   Procedure: TRANSCATHETER AORTIC VALVE REPLACEMENT, TRANSFEMORAL;  Surgeon: [name], MD;  Location: [place] [place];  Service: Open Heart Surgery;  Laterality: Left;     Home Medications:  Prior to Admission medications   Medication Sig Start Date End Date Taking? Authorizing Provider  aspirin EC 81 MG tablet Take 1 tablet (81 mg total) by mouth daily. 05/15/17  Yes Eileen Stanford, PA-C  atorvastatin (LIPITOR) 40 MG tablet Take 2 tablets (80 mg total) by mouth daily. 05/03/17  Yes Allie Bossier, MD  cinacalcet (SENSIPAR) 30 MG tablet Take 30 mg by mouth daily with supper.   Yes [provider]  insulin detemir (LEVEMIR) 100 UNIT/ML injection Inject 0.48 mLs (48 Units total) into the skin at bedtime. 05/03/17  Yes Allie Bossier, MD  lidocaine-prilocaine (EMLA) cream Apply 1 application topically once as needed (prior to accessing port).    Yes [provider]  metoprolol tartrate (LOPRESSOR) 25 MG tablet Take 1 tablet (25 mg total) by mouth 2 (two) times daily. 05/03/17  Yes Allie Bossier, MD  nitroGLYCERIN (NITROSTAT) 0.4 MG SL  tablet Place 0.4 mg under the tongue every 5 (five) minutes as needed for chest pain.    Yes [provider]  ranitidine (ZANTAC) 150 MG tablet Take 150 mg by mouth 2 (two) times daily as needed for heartburn.   Yes [provider]    Inpatient Medications: Scheduled Meds: . [START ON 04/15/2018] aspirin EC  81 mg Oral Daily  . atorvastatin  80 mg Oral Daily  . cinacalcet  30 mg Oral Q supper  . famotidine  20 mg Oral BID  . insulin aspart  0-15 Units Subcutaneous TID WC  . insulin aspart  0-5 Units Subcutaneous QHS  . insulin detemir  48 Units Subcutaneous QHS  . metoprolol tartrate  25 mg Oral BID  . sodium chloride flush  3 mL Intravenous Q12H   Continuous Infusions: . sodium chloride    . heparin 1,400 Units/hr (04/14/18 0410)  . nitroGLYCERIN 10 mcg/min (04/14/18 1009)   PRN Meds: sodium chloride,  acetaminophen, ondansetron (ZOFRAN) IV, sodium chloride flush  Allergies:    Allergies  Allergen Reactions  . Byetta 10 Mcg Pen [Exenatide] Diarrhea and Nausea And Vomiting  . Codeine Itching    Social History:   Social History   Socioeconomic History  . Marital status: Married    Spouse name: Not on file  . Number of children: Not on file  . Years of education: Not on file  . Highest education level: Not on file  Occupational History  . Occupation: retired  Scientific laboratory technician  . Financial resource strain: Not on file  . Food insecurity:    Worry: Not on file    Inability: Not on file  . Transportation needs:    Medical: Not on file    Non-medical: Not on file  Tobacco Use  . Smoking status: Former Smoker    Packs/day: 2.00    Years: 32.00    Pack years: 64.00    Last attempt to quit: 11/27/1983    Years since quitting: 34.4  . Smokeless tobacco: Never Used  Substance and Sexual Activity  . Alcohol use: Yes    Comment: rare  . Drug use: No  . Sexual activity: Yes    Birth control/protection: None  Lifestyle  . Physical activity:    Days per  week: Not on file    Minutes per session: Not on file  . Stress: Not on file  Relationships  . Social connections:    Talks on phone: Not on file    Gets together: Not on file    Attends religious service: Not on file    Active member of club or organization: Not on file    Attends meetings of clubs or organizations: Not on file    Relationship status: Not on file  . Intimate partner violence:    Fear of current or ex partner: Not on file    Emotionally abused: Not on file    Physically abused: Not on file    Forced sexual activity: Not on file  Other Topics Concern  . Not on file  Social History Narrative  . Not on file    Family History:    Family History  Problem Relation Age of Onset  . Diabetes Father   . Heart disease Father   . Diabetes Sister   . Alzheimer's disease Mother      ROS:  Please see the history of present illness.   All other ROS reviewed and negative.     Physical Exam/Data:   Vitals:   04/14/18 0845 04/14/18 0900 04/14/18 0930 04/14/18 0945  BP: 128/64 (!) 110/91 (!) 122/58 127/79  Pulse: 80 75 76 76  Resp: 17 18 20 14   Temp:      TempSrc:      SpO2: 97% 99% 99% 98%  Weight:      Height:        Intake/Output Summary (Last 24 hours) at 04/14/2018 1025 Last data filed at 04/14/2018 1002 Gross per 24 hour  Intake 8.08 ml  Output -  Net 8.08 ml   Filed Weights   04/14/18 0337  Weight: 241 lb (109.3 kg)   Body mass index is 33.61 kg/m.  General:  Well nourished, well developed, in no acute distress HEENT: normal Neck: no JVD Vascular: No carotid bruits Cardiac:  normal S1, S2; RRR; no murmur Lungs:  clear to auscultation bilaterally, no wheezing, rhonchi or rales  Abd: soft, nontender, no hepatomegaly  Ext: trace B LE  edema Musculoskeletal:  No deformities, BUE and BLE strength normal and equal, left fistula Skin: warm and dry  Neuro:  CNs 2-12 intact, no focal abnormalities noted Psych:  Normal affect   EKG:  The EKG was  personally reviewed and demonstrates:  Paced rhythm Telemetry:  Telemetry was personally reviewed and demonstrates:  Paced rhythm  Relevant CV Studies:  Heart cath 05/01/17:  Prox LAD lesion, 65 %stenosed.  Prox Cx to Mid Cx lesion, 40 %stenosed.  3rd Mrg lesion, 30 %stenosed.  Mid RCA lesion, 100 %stenosed.  Hemodynamic findings consistent with mild pulmonary hypertension.   Mild elevation of right heart pressures with mild pulmonary hypertension.  Well-seated TAVR valve.  Multi-vessel CAD with a calcified proximal to mid LAD with 60-70% proximal LAD stenosis, which has slightly progressed from previously, 30-40% mid AV groove circumflex stenoses with 30% stenosis in the distal marginal branch; and previously noted old total occlusion of the mid RCA with left-to-right collaterals to the PDA vessel.  RECOMMENDATION: Recommend an initial increased medical therapy trial.  If patient develops recurrent symptomatology, consider possible PCI with atherectomy to the proximal LAD lesion.   Echo 04/28/17: Study Conclusions - Left ventricle: The cavity size was normal. Wall thickness was   increased in a pattern of moderate LVH. Systolic function was   mildly to moderately reduced. The estimated ejection fraction was   40%. Diffuse hypokinesis. Doppler parameters are consistent with   restrictive physiology, indicative of decreased left ventricular   diastolic compliance and/or increased left atrial pressure.   Doppler parameters are consistent with high ventricular filling   pressure. - Regional wall motion abnormality: Hypokinesis of the basal   inferoseptal myocardium; moderate hypokinesis of the apical   anterior, apical septal, apical lateral, and apical myocardium. 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 No significant bioprosthetic valvular   stenosis. There was no regurgitation. Peak velocity (S): 290   cm/s. Mean gradient  (S): 15 mm Hg. Valve area (VTI): 2.16 cm^2.   Valve area (Vmax): 2.08 cm^2. Valve area (Vmean): 2.15 cm^2. - Mitral valve: Calcified annulus. Mildly thickened leaflets .   There was moderate regurgitation. There is some acoustic   shadowing from mitral annular calcification. - Left atrium: The atrium was severely dilated. - Right ventricle: Pacer wire or catheter noted in right ventricle. - Right atrium: The atrium was mildly dilated. - Tricuspid valve: There was mild regurgitation. - Pulmonic valve: There was mild regurgitation.  Impressions: - I compared today&'s study to the study dated 07/19/16. LVEF appears   slightly better in some views and worse in others, but appears to   be in the 40% range. The apex, apical septum, anteroapical wall,   and anterolateral walls may be slightly more hypokinetic than   before.  Laboratory Data:  Chemistry Recent Labs  Lab 04/14/18 0230  NA 134*  K 4.5  CL 97*  CO2 22  GLUCOSE 233*  BUN 61*  CREATININE 6.59*  CALCIUM 8.8*  GFRNONAA 7*  GFRAA 8*  ANIONGAP 15    No results for input(s): PROT, ALBUMIN, AST, ALT, ALKPHOS, BILITOT in the last 168 hours. Hematology Recent Labs  Lab 04/14/18 0230  WBC 7.7  RBC 3.28*  HGB 10.0*  HCT 31.0*  MCV 94.5  MCH 30.5  MCHC 32.3  RDW 14.0  PLT 174   Cardiac EnzymesNo results for input(s): TROPONINI in the last 168 hours.  Recent Labs  Lab 04/14/18 0239  TROPIPOC 0.14*  BNPNo results for input(s): BNP, PROBNP in the last 168 hours.  DDimer No results for input(s): DDIMER in the last 168 hours.  Radiology/Studies:  Dg Chest 2 View  Result Date: 04/14/2018 CLINICAL DATA:  Chest pain EXAM: CHEST - 2 VIEW COMPARISON:  11/14/2017 FINDINGS: Right-sided pacing device as before, incompletely visualized ventricular leads. Cardiac valve. Incomplete inclusion of the lung bases. Hyperinflation. No pleural effusion. Cardiomegaly. Aortic atherosclerosis. No pneumothorax. IMPRESSION: No active  cardiopulmonary disease.  Cardiomegaly Electronically Signed   By: Donavan Foil M.D.   On: 04/14/2018 03:17    Assessment and Plan:   1. Chest pain, CAD s/p heart cath in 2018 with occlusion of the RCA, 65% stenosis of the proximal LAD, and 40% stenosis of the proximal to mid Cx - troponin 0.14 at 0239 (3 hrs after initial onset of chest pain) - repeat troponin pending - EKG with paced rhythm (A-sensed) - heparin and nitro drips started, now chest pain free - would favor repeat heart catheterization today given his positive troponin and known CAD - he is NPO   2. Chronic systolic and diastolic heart failure - volume status managed by nephrology and HD - mild lower extremity edema not out of proportion to expected edema on HD day after the weekend - no further shortness of breath   3. AS s/p TAVR, stable by last echo 2018   4. ESRD on HD - to manage volume status    For questions or updates, please contact [facility] Please consult [email or web address] for contact info under Cardiology/STEMI.   Signed, Ledora Bottcher, [place]  04/14/2018 10:25 AM   Agree with note by Fabian Sharp PA-C  Patient with known CAD occluded dominant right with moderate proximal LAD disease by recent cath a year ago.  He has had TAVR, and has chronic systolic heart failure as well as chronic renal insufficiency on hemodialysis.  He was admitted with accelerated angina having taken 3 sublingual nitroglycerin.  His rhythm is paced.  His first enzymes are minimally positive.  He will need diagnostic coronary angiography to define his anatomy and rule out progression of disease in his proximal LAD.  His exam is benign.  Lorretta Harp, M.D., [place], [facility], [place] [address] [place], [place]  [id number]  [phone] 04/14/2018 12:53 PM

## 2018-04-14 NOTE — ED Provider Notes (Signed)
[facility] EMERGENCY DEPARTMENT Provider Note   CSN: [id number] Arrival date & time: 04/14/18  0206     History   Chief Complaint Chief Complaint  Patient presents with  . Chest Pain    HPI Jeffrey [name] is a 77 y.o. male.  Patient presents to the emergency department for evaluation of chest pain.  Patient had onset of substernal chest pain that radiated to both shoulders and his jaw earlier tonight.  He had significant shortness of breath associated with the pain.  He took a nitroglycerin and the pain resolved.  30 minutes later the pain came back.  He took another nitroglycerin with resolution of the pain.  Approximately 30 minutes after that, the pain came back once again.  He proceeded to the ER, took a third nitroglycerin on the way and he is now pain-free again.  He no longer has any shortness of breath.  He reports that this is unusual for him, he normally only has to take a nitro approximately once a month.     Past Medical History:  Diagnosis Date  . Anginal pain ([place])   . Aortic stenosis    a. severe by echo 09/2014  . Atrial fibrillation ([place])    a. not well documented, not on anticoagulation  . CHF (congestive heart failure) ([place])    04/28/17 echo-EF 40%, mod LVH, diastolic dysfunction  . Claustrophobia   . Complete heart block ([place])   . COPD (chronic obstructive pulmonary disease) ([place])   . Coronary artery disease    a. chronically occluded RCA per cath 09/2014 with collaterals B. cath 05/01/17 chr occ RCA w/collaterals, 60-70% mid LAD,   . CVA (cerebral vascular accident) ([place]) 10/2014   denies residual on 07/11/2015  . ESRD (end stage renal disease) on dialysis [facility])    a. on dialysis; [facility]; MWF, LUE fistula (07/11/2015)  . History of blood transfusion    "related to gallbladder OR"  . History of stomach ulcers   . Hyperlipidemia   . Hypertension   . Iron deficiency anemia   . Myocardial infarction ([place]) 10/2014  . Peripheral  vascular disease ([place])   . Pneumonia   . Presence of permanent cardiac pacemaker   . Renal insufficiency   . 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 Type II diabetes mellitus [facility])     Patient Active Problem List   Diagnosis Date Noted  . COPD GOLD 0 09/26/2017  . Atrial fibrillation ([place]) [I48.91] 05/21/2017  . Encounter for therapeutic drug monitoring 05/21/2017  . Coronary artery disease   . Obesity (BMI 30-39.9) 04/17/2017  . Hyponatremia 03/10/2017  . COPD with acute exacerbation ([place]) 03/10/2017  . Non-healing open wound of left groin 09/05/2015  . Automatic implantable cardioverter-defibrillator in situ 08/08/2015  . 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 Aortic stenosis   . Hypervolemia   . Severe aortic stenosis   . Chronic combined systolic and diastolic CHF (congestive heart failure) ([place]) 07/19/2015  . Fluid overload 07/18/2015  . Acute on chronic respiratory failure with hypoxia ([place]) 07/18/2015  . Cardiac device in situ, other   . 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/2015  . Chest pain   . Atherosclerosis of native coronary artery of native heart with angina pectoris ([place])   . Protein-calorie malnutrition, severe ([place]) 01/14/2015  . ESRD (end stage renal disease)  on dialysis ([place]) 01/12/2015  . Aortic valve stenosis, moderate 01/11/2015  . Mobitz type 1 second degree AV block   . Elevated troponin 09/30/2014  . History of stroke 09/29/2014  . Type 2 diabetes mellitus with chronic kidney disease on chronic dialysis, with long-term current use of insulin ([place]) 09/29/2014  . CAD (coronary artery disease) 09/29/2014  . Essential hypertension   . History of tobacco use 11/21/2012  . Hyperlipidemia 01/19/2010  . Arthritis, degenerative 01/19/2010  . Allergic rhinitis 12/05/2009  . Idiopathic peripheral  neuropathy 01/05/2009  . 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  . Absolute anemia 11/13/2007    Past Surgical History:  Procedure Laterality Date  . AV FISTULA PLACEMENT Left 10/19/2014   Procedure: BRACHIOCEPHALIC ARTERIOVENOUS (AV) FISTULA CREATION ;  Surgeon: Conrad Finzel, MD;  Location: [place] [facility];  Service: Vascular;  Laterality: Left;  . CARDIAC CATHETERIZATION    . CARDIAC CATHETERIZATION N/A 07/22/2015   Procedure: Right/Left Heart Cath and Coronary Angiography;  Surgeon: [name], MD;  Location: [place] [facility] CV LAB;  Service: Cardiovascular;  Laterality: N/A;  . CATARACT EXTRACTION W/ INTRAOCULAR LENS  IMPLANT, BILATERAL Bilateral 1990's  . CHOLECYSTECTOMY OPEN  1980's  . COLONOSCOPY W/ BIOPSIES AND POLYPECTOMY    . CORONARY ANGIOPLASTY    . EP IMPLANTABLE DEVICE N/A 07/11/2015   Procedure: Pacemaker Implant;  Surgeon: [name], MD;  Location: [place] CV LAB;  Service: Cardiovascular;  Laterality: N/A;  . ESOPHAGOGASTRODUODENOSCOPY  08/01/2012   Procedure: ESOPHAGOGASTRODUODENOSCOPY (EGD);  Surgeon: [name], MD;  Location: [facility];  Service: Endoscopy;  Laterality: N/A;  . INSERT / REPLACE / REMOVE PACEMAKER  07/11/2015  . INSERTION OF DIALYSIS CATHETER Right 02/02/2015   Procedure: INSERTION OF DIALYSIS CATHETER  RIGHT INTERNAL JUGULAR;  Surgeon: Mal [name], MD;  Location: [place];  Service: Vascular;  Laterality: Right;  . LEFT AND RIGHT HEART CATHETERIZATION WITH CORONARY ANGIOGRAM N/A 09/30/2014   Procedure: LEFT AND RIGHT HEART CATHETERIZATION WITH CORONARY ANGIOGRAM;  Surgeon: [name], MD;  Location: [facility] CATH LAB;  Service: Cardiovascular;  Laterality: N/A;  . TEE WITHOUT CARDIOVERSION N/A 08/02/2015   Procedure: TRANSESOPHAGEAL ECHOCARDIOGRAM (TEE);  Surgeon: [name], MD;  Location: [facility];  Service: Open Heart Surgery;  Laterality: N/A;  . TONSILLECTOMY    . TRANSCATHETER AORTIC VALVE REPLACEMENT, TRANSFEMORAL Left  08/02/2015   Procedure: TRANSCATHETER AORTIC VALVE REPLACEMENT, TRANSFEMORAL;  Surgeon: [name], MD;  Location: [place];  Service: Open Heart Surgery;  Laterality: Left;        Home Medications    Prior to Admission medications   Medication Sig Start Date End Date Taking? Authorizing Provider  aspirin EC 81 MG tablet Take 1 tablet (81 mg total) by mouth daily. 05/15/17  Yes Eileen Stanford, PA-C  atorvastatin (LIPITOR) 40 MG tablet Take 2 tablets (80 mg total) by mouth daily. 05/03/17  Yes Allie Bossier, MD  cinacalcet (SENSIPAR) 30 MG tablet Take 30 mg by mouth daily with supper.   Yes [provider]  insulin detemir (LEVEMIR) 100 UNIT/ML injection Inject 0.48 mLs (48 Units total) into the skin at bedtime. 05/03/17  Yes Allie Bossier, MD  lidocaine-prilocaine (EMLA) cream Apply 1 application topically once as needed (prior to accessing port).    Yes [provider]  metoprolol tartrate (LOPRESSOR) 25 MG tablet Take 1 tablet (25 mg total) by mouth 2 (two) times daily. 05/03/17  Yes Allie Bossier, MD  nitroGLYCERIN (NITROSTAT) 0.4  MG SL tablet Place 0.4 mg under the tongue every 5 (five) minutes as needed for chest pain.    Yes [provider]  ranitidine (ZANTAC) 150 MG tablet Take 150 mg by mouth 2 (two) times daily as needed for heartburn.   Yes [provider]    Family History Family History  Problem Relation Age of Onset  . Diabetes Father   . Heart disease Father   . Diabetes Sister   . Alzheimer's disease Mother     Social History Social History   Tobacco Use  . Smoking status: Former Smoker    Packs/day: 2.00    Years: 32.00    Pack years: 64.00    Last attempt to quit: 11/27/1983    Years since quitting: 34.4  . Smokeless tobacco: Never Used  Substance Use Topics  . Alcohol use: Yes    Comment: occ  . Drug use: No     Allergies   Byetta 10 mcg pen [exenatide] and Codeine   Review of Systems Review of Systems    Respiratory: Positive for shortness of breath.   Cardiovascular: Positive for chest pain.  All other systems reviewed and are negative.    Physical Exam Updated Vital Signs BP (!) 105/47   Pulse 66   Temp 97.7 F (36.5 C) (Oral)   Resp 19   SpO2 97%   Physical Exam  Constitutional: He is oriented to person, place, and time. He appears well-developed and well-nourished. No distress.  HENT:  Head: Normocephalic and atraumatic.  Right Ear: Hearing normal.  Left Ear: Hearing normal.  Nose: Nose normal.  Mouth/Throat: Oropharynx is clear and moist and mucous membranes are normal.  Eyes: Pupils are equal, round, and reactive to light. Conjunctivae and EOM are normal.  Neck: Normal range of motion. Neck supple.  Cardiovascular: Regular rhythm, S1 normal and S2 normal. Exam reveals no gallop and no friction rub.  No murmur heard. Pulmonary/Chest: Effort normal and breath sounds normal. No respiratory distress. He exhibits no tenderness.  Abdominal: Soft. Normal appearance and bowel sounds are normal. There is no hepatosplenomegaly. There is no tenderness. There is no rebound, no guarding, no tenderness at McBurney's point and negative Murphy's sign. No hernia.  Musculoskeletal: Normal range of motion.  Neurological: He is alert and oriented to person, place, and time. He has normal strength. No cranial nerve deficit or sensory deficit. Coordination normal. GCS eye subscore is 4. GCS verbal subscore is 5. GCS motor subscore is 6.  Skin: Skin is warm, dry and intact. No rash noted. No cyanosis.  Psychiatric: He has a normal mood and affect. His speech is normal and behavior is normal. Thought content normal.  Nursing note and vitals reviewed.    ED Treatments / Results  Labs (all labs ordered are listed, but only abnormal results are displayed) Labs Reviewed  BASIC METABOLIC PANEL - Abnormal; Notable for the following components:      Result Value   Sodium 134 (*)    Chloride 97  (*)    Glucose, Bld 233 (*)    BUN 61 (*)    Creatinine, Ser 6.59 (*)    Calcium 8.8 (*)    GFR calc non Af Amer 7 (*)    GFR calc Af Amer 8 (*)    All other components within normal limits  CBC - Abnormal; Notable for the following components:   RBC 3.28 (*)    Hemoglobin 10.0 (*)    HCT 31.0 (*)  All other components within normal limits  I-STAT TROPONIN, ED - Abnormal; Notable for the following components:   Troponin i, poc 0.14 (*)    All other components within normal limits    EKG EKG Interpretation  Date/Time:  Monday Apr 14 2018 02:10:50 EDT Ventricular Rate:  65 PR Interval:  198 QRS Duration: 192 QT Interval:  468 QTC Calculation: 486 R Axis:   -59 Text Interpretation:  Atrial-sensed ventricular-paced rhythm Abnormal ECG Confirmed by Orpah Greek [id number]) on 04/14/2018 3:04:27 AM   Radiology Dg Chest 2 View  Result Date: 04/14/2018 CLINICAL DATA:  Chest pain EXAM: CHEST - 2 VIEW COMPARISON:  11/14/2017 FINDINGS: Right-sided pacing device as before, incompletely visualized ventricular leads. Cardiac valve. Incomplete inclusion of the lung bases. Hyperinflation. No pleural effusion. Cardiomegaly. Aortic atherosclerosis. No pneumothorax. IMPRESSION: No active cardiopulmonary disease.  Cardiomegaly Electronically Signed   By: Donavan Foil M.D.   On: 04/14/2018 03:17    Procedures Procedures (including critical care time)  Medications Ordered in ED Medications  nitroGLYCERIN (NITROGLYN) 2 % ointment 1 inch (has no administration in time range)  aspirin chewable tablet 324 mg (has no administration in time range)     Initial Impression / Assessment and Plan / ED Course  I have reviewed the triage vital signs and the nursing notes.  Pertinent labs & imaging results that were available during my care of the patient were reviewed by me and considered in my medical decision making (see chart for details).     Patient with a known history of coronary  artery disease presents to the ER with chest pain.  Pain has been severe at times tonight, resolves with nitroglycerin but then comes back.  This is unusual for him, he reports that he generally only gets pain approximately once a month that goes away with a nitroglycerin and does not return.  He is currently pain-free.  EKG shows ventricular pacing.  Troponin is slightly elevated at 0.14.  Patient initiated on IV nitro.  He was placed on Nitropaste, but did start having recurrent chest pain and shortness of breath.  Patient therefore placed on nitro drip.  Discussed with cardiology on-call, agrees with current treatment plan.  Does not feel that the patient warrants admission to the cardiology service at this time (appears to have been deemed medical management in the past), cardiology will consult.  CRITICAL CARE Performed by: Orpah Greek   Total critical care time: 30 minutes  Critical care time was exclusive of separately billable procedures and treating other patients.  Critical care was necessary to treat or prevent imminent or life-threatening deterioration.  Critical care was time spent personally by me on the following activities: development of treatment plan with patient and/or surrogate as well as nursing, discussions with consultants, evaluation of patient's response to treatment, examination of patient, obtaining history from patient or surrogate, ordering and performing treatments and interventions, ordering and review of laboratory studies, ordering and review of radiographic studies, pulse oximetry and re-evaluation of patient's condition.   Final Clinical Impressions(s) / ED Diagnoses   Final diagnoses:  ACS (acute coronary syndrome) [facility])    ED Discharge Orders    None       Orpah Greek, MD 04/14/18 [phone]

## 2018-04-14 NOTE — ED Notes (Addendum)
Pacemaker is being interrogated by [employer]

## 2018-04-14 NOTE — Progress Notes (Signed)
Admitting patient when cath lab came to get patient. Will assess and document once pt. Returns from cath lab.

## 2018-04-14 NOTE — Interval H&P Note (Signed)
Cath Lab Visit (complete for each Cath Lab visit)  Clinical Evaluation Leading to the Procedure:   ACS: Yes.    Non-ACS:    Anginal Classification: CCS IV  Anti-ischemic medical therapy: Maximal Therapy (2 or more classes of medications)  Non-Invasive Test Results: No non-invasive testing performed  Prior CABG: No previous CABG      History and Physical Interval Note:  04/14/2018 3:19 PM  Jeffrey [name]  has presented today for surgery, with the diagnosis of cp  The various methods of treatment have been discussed with the patient and family. After consideration of risks, benefits and other options for treatment, the patient has consented to  Procedure(s): LEFT HEART CATH AND CORONARY ANGIOGRAPHY (N/A) as a surgical intervention .  The patient's history has been reviewed, patient examined, no change in status, stable for surgery.  I have reviewed the patient's chart and labs.  Questions were answered to the patient's satisfaction.     Shelva Majestic

## 2018-04-14 NOTE — Interval H&P Note (Signed)
Cath Lab Visit (complete for each Cath Lab visit)  Clinical Evaluation Leading to the Procedure:   ACS: Yes.    Non-ACS:    Anginal Classification: CCS IV  Anti-ischemic medical therapy: Maximal Therapy (2 or more classes of medications)  Non-Invasive Test Results: No non-invasive testing performed  Prior CABG: No previous CABG      History and Physical Interval Note:  04/14/2018 3:18 PM  Jeffrey [name]  has presented today for surgery, with the diagnosis of cp  The various methods of treatment have been discussed with the patient and family. After consideration of risks, benefits and other options for treatment, the patient has consented to  Procedure(s): LEFT HEART CATH AND CORONARY ANGIOGRAPHY (N/A) as a surgical intervention .  The patient's history has been reviewed, patient examined, no change in status, stable for surgery.  I have reviewed the patient's chart and labs.  Questions were answered to the patient's satisfaction.     Shelva Majestic

## 2018-04-14 NOTE — H&P (Signed)
History and Physical    Jeffrey [name] SEG:[id number] DOB: 02-23-1941 DOA: 04/14/2018  PCP: Thurman Coyer, MD Consultants:  [facility] - cardiology; [facility] - nephrology Patient coming from:  Home - lives with wife and son;  [place]: wife, [phone]  Chief Complaint:  Chest pain  HPI: Jeffrey [name] is a 77 y.o. male with medical history significant of DM; s/p TAVR; pacemaker placement; PVD; HTN; HLD; ESRD on MWF HD; CVA; COPD; diastolic CHF; afib (not on Warfarin due to rash); and CAD (cath 05/01/17 with 100% RCA stenosis and multivessel disease with med management but possible future need for PCI with atherectomy to the proximal LAD lesion) presenting with chest pain.  He began "having chest pain, it feels like something squeezing to death.  My gums start hurting and my shoulders hurt like heck".  He gets SOB with the chest pain.  Last night, his arms were hurting too.  The chest pain started last night about 11pm.  He took a NTG and needed another 30 minutes later.  It started really hurting about 1230 and he took another NTG because "I didn't think I was Jeffrey [name] make it here".  After that it kind of eased up.  Then in the ER it started again.  The pain did resolve with the NTG but then it recurred.  Substernal chest pressure, similar to prior episodes.   He was diagnosed with PNA last week and given 2 different types of antibiotics at HD for the last 3 sessions.  It does seem to be better now.   ED Course:  CP - resolves with NTG and recurs.   Troponin 0.14.  Started on NTG drip and heparin drip.  Cardiology to consult.  Review of Systems: As per HPI; otherwise review of systems reviewed and negative.   Ambulatory Status:  Ambulates with a cane or a scooter  Past Medical History:  Diagnosis Date  . Anginal pain ([place])   . Aortic stenosis    a. severe by echo 09/2014  . Atrial fibrillation ([place])    a. not well documented, not on anticoagulation  . CHF (congestive heart failure) ([place])     04/28/17 echo-EF 40%, mod LVH, diastolic dysfunction  . Claustrophobia   . Complete heart block ([place])   . COPD (chronic obstructive pulmonary disease) ([place])   . Coronary artery disease    a. chronically occluded RCA per cath 09/2014 with collaterals B. cath 05/01/17 chr occ RCA w/collaterals, 60-70% mid LAD,   . CVA (cerebral vascular accident) ([place]) 10/2014   denies residual on 07/11/2015  . ESRD (end stage renal disease) on dialysis [facility])    a. on dialysis; [place] [facility]; MWF, LUE fistula (07/11/2015)  . History of blood transfusion    "related to gallbladder OR"  . History of stomach ulcers   . Hyperlipidemia   . Hypertension   . Iron deficiency anemia   . Myocardial infarction ([place]) 10/2014  . Peripheral vascular disease ([place])   . Pneumonia   . Presence of permanent cardiac pacemaker   . 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 Type II diabetes mellitus ([place])     Past Surgical History:  Procedure Laterality Date  . AV FISTULA PLACEMENT Left 10/19/2014   Procedure: BRACHIOCEPHALIC ARTERIOVENOUS (AV) FISTULA CREATION ;  Surgeon: Conrad [place], MD;  Location: [place];  Service: Vascular;  Laterality: Left;  . CARDIAC  CATHETERIZATION    . CARDIAC CATHETERIZATION N/A 07/22/2015   Procedure: Right/Left Heart Cath and Coronary Angiography;  Surgeon: [name], MD;  Location: [place] CV LAB;  Service: Cardiovascular;  Laterality: N/A;  . CATARACT EXTRACTION W/ INTRAOCULAR LENS  IMPLANT, BILATERAL Bilateral 1990's  . CHOLECYSTECTOMY OPEN  1980's  . COLONOSCOPY W/ BIOPSIES AND POLYPECTOMY    . CORONARY ANGIOPLASTY    . EP IMPLANTABLE DEVICE N/A 07/11/2015   Procedure: Pacemaker Implant;  Surgeon: [name], MD;  Location: [place] CV LAB;  Service: Cardiovascular;  Laterality: N/A;  . ESOPHAGOGASTRODUODENOSCOPY  08/01/2012   Procedure:  ESOPHAGOGASTRODUODENOSCOPY (EGD);  Surgeon: [name], MD;  Location: [facility];  Service: Endoscopy;  Laterality: N/A;  . INSERT / REPLACE / REMOVE PACEMAKER  07/11/2015  . INSERTION OF DIALYSIS CATHETER Right 02/02/2015   Procedure: INSERTION OF DIALYSIS CATHETER  RIGHT INTERNAL JUGULAR;  Surgeon: Mal [name], MD;  Location: [place];  Service: Vascular;  Laterality: Right;  . LEFT AND RIGHT HEART CATHETERIZATION WITH CORONARY ANGIOGRAM N/A 09/30/2014   Procedure: LEFT AND RIGHT HEART CATHETERIZATION WITH CORONARY ANGIOGRAM;  Surgeon: [name], MD;  Location: [facility] CATH LAB;  Service: Cardiovascular;  Laterality: N/A;  . TEE WITHOUT CARDIOVERSION N/A 08/02/2015   Procedure: TRANSESOPHAGEAL ECHOCARDIOGRAM (TEE);  Surgeon: [name], MD;  Location: [place];  Service: Open Heart Surgery;  Laterality: N/A;  . TONSILLECTOMY    . TRANSCATHETER AORTIC VALVE REPLACEMENT, TRANSFEMORAL Left 08/02/2015   Procedure: TRANSCATHETER AORTIC VALVE REPLACEMENT, TRANSFEMORAL;  Surgeon: [name], MD;  Location: [place];  Service: Open Heart Surgery;  Laterality: Left;    Social History   Socioeconomic History  . Marital status: Married    Spouse name: Not on file  . Number of children: Not on file  . Years of education: Not on file  . Highest education level: Not on file  Occupational History  . Occupation: retired  Scientific laboratory technician  . Financial resource strain: Not on file  . Food insecurity:    Worry: Not on file    Inability: Not on file  . Transportation needs:    Medical: Not on file    Non-medical: Not on file  Tobacco Use  . Smoking status: Former Smoker    Packs/day: 2.00    Years: 32.00    Pack years: 64.00    Last attempt to quit: 11/27/1983    Years since quitting: 34.4  . Smokeless tobacco: Never Used  Substance and Sexual Activity  . Alcohol use: Yes    Comment: rare  . Drug use: No  . Sexual activity: Yes    Birth control/protection: None  Lifestyle  . Physical activity:      Days per week: Not on file    Minutes per session: Not on file  . Stress: Not on file  Relationships  . Social connections:    Talks on phone: Not on file    Gets together: Not on file    Attends religious service: Not on file    Active member of club or organization: Not on file    Attends meetings of clubs or organizations: Not on file    Relationship status: Not on file  . Intimate partner violence:    Fear of current or ex partner: Not on file    Emotionally abused: Not on file    Physically abused: Not on file    Forced sexual activity: Not on file  Other Topics Concern  .  Not on file  Social History Narrative  . Not on file    Allergies  Allergen Reactions  . Byetta 10 Mcg Pen [Exenatide] Diarrhea and Nausea And Vomiting  . Codeine Itching    Family History  Problem Relation Age of Onset  . Diabetes Father   . Heart disease Father   . Diabetes Sister   . Alzheimer's disease Mother     Prior to Admission medications   Medication Sig Start Date End Date Taking? Authorizing Provider  aspirin EC 81 MG tablet Take 1 tablet (81 mg total) by mouth daily. 05/15/17  Yes Eileen Stanford, PA-C  atorvastatin (LIPITOR) 40 MG tablet Take 2 tablets (80 mg total) by mouth daily. 05/03/17  Yes Allie Bossier, MD  cinacalcet (SENSIPAR) 30 MG tablet Take 30 mg by mouth daily with supper.   Yes [provider]  insulin detemir (LEVEMIR) 100 UNIT/ML injection Inject 0.48 mLs (48 Units total) into the skin at bedtime. 05/03/17  Yes Allie Bossier, MD  lidocaine-prilocaine (EMLA) cream Apply 1 application topically once as needed (prior to accessing port).    Yes [provider]  metoprolol tartrate (LOPRESSOR) 25 MG tablet Take 1 tablet (25 mg total) by mouth 2 (two) times daily. 05/03/17  Yes Allie Bossier, MD  nitroGLYCERIN (NITROSTAT) 0.4 MG SL tablet Place 0.4 mg under the tongue every 5 (five) minutes as needed for chest pain.    Yes [provider]   ranitidine (ZANTAC) 150 MG tablet Take 150 mg by mouth 2 (two) times daily as needed for heartburn.   Yes [provider]    Physical Exam: Vitals:   04/14/18 0845 04/14/18 0900 04/14/18 0930 04/14/18 0945  BP: 128/64 (!) 110/91 (!) 122/58 127/79  Pulse: 80 75 76 76  Resp: 17 18 20 14   Temp:      TempSrc:      SpO2: 97% 99% 99% 98%  Weight:      Height:         General:  Appears calm and comfortable and is NAD Eyes:  PERRL, EOMI, normal lids, iris ENT:  grossly normal hearing, lips & tongue, mmm Neck:  no LAD, masses or thyromegaly; no carotid bruits Cardiovascular:  RRR, no m/r/g. No LE edema.  Respiratory:   CTA bilaterally with no wheezes/rales/rhonchi.  Normal respiratory effort. Abdomen:  soft, NT, ND, NABS Back:   normal alignment, no CVAT Skin:  Diffuse maculopapular rash primarily on arms and back with some excoriation from scratching Musculoskeletal:  grossly normal tone BUE/BLE, good ROM, no bony abnormality Lower extremity:  No LE edema.  Limited foot exam with no ulcerations.  2+ distal pulses. Psychiatric:  grossly normal mood and affect, speech fluent and appropriate, AOx3 Neurologic:  CN 2-12 grossly intact, moves all extremities in coordinated fashion, sensation intact    Radiological Exams on Admission: Dg Chest 2 View  Result Date: 04/14/2018 CLINICAL DATA:  Chest pain EXAM: CHEST - 2 VIEW COMPARISON:  11/14/2017 FINDINGS: Right-sided pacing device as before, incompletely visualized ventricular leads. Cardiac valve. Incomplete inclusion of the lung bases. Hyperinflation. No pleural effusion. Cardiomegaly. Aortic atherosclerosis. No pneumothorax. IMPRESSION: No active cardiopulmonary disease.  Cardiomegaly Electronically Signed   By: Donavan Foil M.D.   On: 04/14/2018 03:17    EKG: Independently reviewed.  Atrial sensed ventricular paced rhythm with rate 65   Labs on Admission: I have personally reviewed the available labs and imaging studies at  the time of the admission.  Pertinent labs:   Glucose 233 BUN 61/Creatinine 6.59/GFR 7 Troponin 0.14 (not generally elevated despite ESRD) Hgb 10.0   Assessment/Plan Principal Problem:   NSTEMI (non-ST elevated myocardial infarction) ([place]) Active Problems:   Type 2 diabetes mellitus with chronic kidney disease on chronic dialysis, with long-term current use of insulin (HCC)   Essential hypertension   Elevated troponin   ESRD (end stage renal disease) on dialysis [facility])   Atherosclerosis of native coronary artery of native heart with angina pectoris (HCC)   Mobitz (type) I (Wenckebach's) atrioventricular block   Chronic combined systolic and diastolic CHF (congestive heart failure) (HCC)   S/P TAVR (transcatheter aortic valve replacement)   Hyperlipidemia   Automatic implantable cardioverter-defibrillator in situ   Atrial fibrillation (HCC) [I48.91]   Rash   NSTEMI with known h/o CAD -Patient with substernal chest pain that came on acutely at rest and has been intermittent for several hours but resolves with NTG; he is now on a heparin drip. -CXR unremarkable.   -Troponin is positive, continuing to trend.   -He has known prior diffuse disease on 6/18 cath with possible need for future PCI for proximal LAD lesion - he appears likely to need repeat cath. -EKG with atrial-sensed ventricular-paced rhythm, making it difficult to decipher acute ST changes. -TIMI risk score is 6; which predicts a 14 days risk of death, recurrent MI, or urgent revascularization of 40.9%.  -Will plan to observe in SDU on telemetry to further evaluate for ACS.  -Patient discussed with cardiology overnight, and they felt that the patient was appropriate for admission to the hospitalist service. -Cardiology has again been consulted and asked to see the patient. -ASA 325 mg PO in the ER, continue with 81 mg daily starting tomorrow -morphine given -Continue Lipitor 80 mg daily for now -Risk factor  stratification with FLP and HgbA1c; will also check TSH  ESRD on HD -Patient on chronic MWF HD -Nephrology prn order set utilized -He does not appear to be volume overloaded or otherwise in need of acute HD -Based on probable need for cath, will consult nephrology but likely hold HD until after the procedure if it is to happen today.  HTN -BP is actually on the low side today despite it being his usual day for HD -Continue Lopressor with tonight's dose, assuming BP has recovered  HLD -Continue Lipitor 80 mg daily -Check FLP -Lipids in 6/18 were: 95/55/30/51  DM -A1c was 7.0 in 6/18, recheck now -Continue Levemir -Cover with moderate-scale SSI  CHF -Echo in 6/18 with EF 40% and unspecified diastolic dysfunction. -No evidence of volume overload at this time.  Afib -Pacer/AICD in place -Not on anticoagulation at this time due to a diffuse rash - there was concern it was related to Coumadin -Will address rash (see below), but it likely would be beneficial for the patient to resume anticoagulation as an outpatient -Currently on a heparin drip  Rash -Diffuse maculopapular eruption primarily on B arms and back -Also with excoriation - it is quite pruritic -He has been on triamcinolone ointment without significant relief -While scabies is a consideration, it appears more diffuse on his arms than this and no one else in the family has it -Could consider skin biopsy as an outpatient -For now, will use prn Sarna and Eucerin with TAC TID and add prn hydroxyzine  DVT prophylaxis: Heparin drip Code Status:  Full - confirmed with patient/family Family Communication: Wife, son, daughter all present throughout evaluation Disposition Plan:  Home once clinically  improved Consults called: Cardiology, nephrology  Admission status: It is my clinical opinion that referral for OBSERVATION is reasonable and necessary in this patient based on the above information provided. The aforementioned  taken together are felt to place the patient at high risk for further clinical deterioration. However it is anticipated that the patient may be medically stable for discharge from the hospital within 24 to 48 hours.    Karmen Bongo MD [facility]  If note is complete, please contact covering daytime or nighttime physician. [email or web address] Password [facility]  04/14/2018, 10:54 AM

## 2018-04-14 NOTE — Consult Note (Signed)
[facility] Renal Consultation Note  Indication for Consultation:  Management of ESRD/hemodialysis; anemia, hypertension/volume and secondary hyperparathyroidism  HPI: Jeffrey [name] is a 77 y.o. male with ESRD Chronic HD on  MWF ( [place] and compliant ) has a history of TAVR, complete heart block status post pacemaker, history of CVA, COPD, CAD = s/p heart cath in 2018 with occlusion of the RCA, 65% stenosis of the proximal LAD, and 40% stenosis of the proximal to mid Cx, type 2 diabetes mellitus, grade 1 diastolic heart failure with EF of 45-50% with diffuse hypokinesis.He is admitted for evaluation of Chest pain  .  3 episodes of  Chest pain with sob started Started 1130 pm   "took  Total of 3 NTG  with last one on drive to ER."     Tropon 0.14  iinitially , CXR  No vol. Overload and K 4.5.  Currently in ER on IV NTG drip without Chest pain or sob .  Reports completed op HD on Friday and recently treated at [facility] center for " PNA" seen 5/13 with chills , cough by Dr Lorrene Reid  And RX   With IV Vanc / fortaz  13.,15.,and 17th  Last 3 HD txs with Neg BC on 04/07/18  .He reports those symptoms resolved last week .  He has home O2 to use prn .         Past Medical History:  Diagnosis Date  . Anginal pain ([place])   . Aortic stenosis    a. severe by echo 09/2014  . Atrial fibrillation ([place])    a. not well documented, not on anticoagulation  . CHF (congestive heart failure) ([place])    04/28/17 echo-EF 40%, mod LVH, diastolic dysfunction  . Claustrophobia   . Complete heart block ([place])   . COPD (chronic obstructive pulmonary disease) ([place])   . Coronary artery disease    a. chronically occluded RCA per cath 09/2014 with collaterals B. cath 05/01/17 chr occ RCA w/collaterals, 60-70% mid LAD,   . CVA (cerebral vascular accident) ([place]) 10/2014   denies residual on 07/11/2015  . ESRD (end stage renal disease) on dialysis [facility])    a. on dialysis; [place]; MWF, LUE fistula (07/11/2015)   . History of blood transfusion    "related to gallbladder OR"  . History of stomach ulcers   . Hyperlipidemia   . Hypertension   . Iron deficiency anemia   . Myocardial infarction ([place]) 10/2014  . Peripheral vascular disease ([place])   . Pneumonia   . Presence of permanent cardiac pacemaker   . 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 Type II diabetes mellitus ([place])     Past Surgical History:  Procedure Laterality Date  . AV FISTULA PLACEMENT Left 10/19/2014   Procedure: BRACHIOCEPHALIC ARTERIOVENOUS (AV) FISTULA CREATION ;  Surgeon: Conrad [place], MD;  Location: [place];  Service: Vascular;  Laterality: Left;  . CARDIAC CATHETERIZATION    . CARDIAC CATHETERIZATION N/A 07/22/2015   Procedure: Right/Left Heart Cath and Coronary Angiography;  Surgeon: [name], MD;  Location: [place] CV LAB;  Service: Cardiovascular;  Laterality: N/A;  . CATARACT EXTRACTION W/ INTRAOCULAR LENS  IMPLANT, BILATERAL Bilateral 1990's  . CHOLECYSTECTOMY OPEN  1980's  . COLONOSCOPY W/ BIOPSIES AND POLYPECTOMY    . CORONARY ANGIOPLASTY    . EP IMPLANTABLE DEVICE N/A 07/11/2015  Procedure: Pacemaker Implant;  Surgeon: [name], MD;  Location: [place] CV LAB;  Service: Cardiovascular;  Laterality: N/A;  . ESOPHAGOGASTRODUODENOSCOPY  08/01/2012   Procedure: ESOPHAGOGASTRODUODENOSCOPY (EGD);  Surgeon: [name], MD;  Location[facility];  Service: Endoscopy;  Laterality: N/A;  . INSERT / REPLACE / REMOVE PACEMAKER  07/11/2015  . INSERTION OF DIALYSIS CATHETER Right 02/02/2015   Procedure: INSERTION OF DIALYSIS CATHETER  RIGHT INTERNAL JUGULAR;  Surgeon: Mal [name], MD;  Location: [place];  Service: Vascular;  Laterality: Right;  . LEFT AND RIGHT HEART CATHETERIZATION WITH CORONARY ANGIOGRAM N/A 09/30/2014   Procedure: LEFT AND RIGHT HEART CATHETERIZATION WITH CORONARY  ANGIOGRAM;  Surgeon: [name], MD;  Location: [facility] CATH LAB;  Service: Cardiovascular;  Laterality: N/A;  . TEE WITHOUT CARDIOVERSION N/A 08/02/2015   Procedure: TRANSESOPHAGEAL ECHOCARDIOGRAM (TEE);  Surgeon: [name], MD;  Location: [place];  Service: Open Heart Surgery;  Laterality: N/A;  . TONSILLECTOMY    . TRANSCATHETER AORTIC VALVE REPLACEMENT, TRANSFEMORAL Left 08/02/2015   Procedure: TRANSCATHETER AORTIC VALVE REPLACEMENT, TRANSFEMORAL;  Surgeon: [name], MD;  Location: [place];  Service: Open Heart Surgery;  Laterality: Left;      Family History  Problem Relation Age of Onset  . Diabetes Father   . Heart disease Father   . Diabetes Sister   . Alzheimer's disease Mother       reports that he quit smoking about 34 years ago. He has a 64.00 pack-year smoking history. He has never used smokeless tobacco. He reports that he drinks alcohol. He reports that he does not use drugs.   Allergies  Allergen Reactions  . Byetta 10 Mcg Pen [Exenatide] Diarrhea and Nausea And Vomiting  . Codeine Itching    Prior to Admission medications   Medication Sig Start Date End Date Taking? Authorizing Provider  aspirin EC 81 MG tablet Take 1 tablet (81 mg total) by mouth daily. 05/15/17  Yes Eileen Stanford, PA-C  atorvastatin (LIPITOR) 40 MG tablet Take 2 tablets (80 mg total) by mouth daily. 05/03/17  Yes Allie Bossier, MD  cinacalcet (SENSIPAR) 30 MG tablet Take 30 mg by mouth daily with supper.   Yes [provider]  insulin detemir (LEVEMIR) 100 UNIT/ML injection Inject 0.48 mLs (48 Units total) into the skin at bedtime. 05/03/17  Yes Allie Bossier, MD  lidocaine-prilocaine (EMLA) cream Apply 1 application topically once as needed (prior to accessing port).    Yes [provider]  metoprolol tartrate (LOPRESSOR) 25 MG tablet Take 1 tablet (25 mg total) by mouth 2 (two) times daily. 05/03/17  Yes Allie Bossier, MD  nitroGLYCERIN (NITROSTAT) 0.4 MG SL tablet Place  0.4 mg under the tongue every 5 (five) minutes as needed for chest pain.    Yes [provider]  ranitidine (ZANTAC) 150 MG tablet Take 150 mg by mouth 2 (two) times daily as needed for heartburn.   Yes [provider]     Anti-infectives (From admission, onward)   None      Results for orders placed or performed during the hospital encounter of 04/14/18 (from the past 48 hour(s))  Basic metabolic panel     Status: Abnormal   Collection Time: 04/14/18  2:30 AM  Result Value Ref Range   Sodium 134 (L) 135 - 145 mmol/L   Potassium 4.5 3.5 - 5.1 mmol/L   Chloride 97 (L) 101 - 111 mmol/L   CO2 22 22 - 32 mmol/L  Glucose, Bld 233 (H) 65 - 99 mg/dL   BUN 61 (H) 6 - 20 mg/dL   Creatinine, Ser 6.59 (H) 0.61 - 1.24 mg/dL   Calcium 8.8 (L) 8.9 - 10.3 mg/dL   GFR calc non Af Amer 7 (L) >60 mL/min   GFR calc Af Amer 8 (L) >60 mL/min    Comment: (NOTE) The eGFR has been calculated using the CKD EPI equation. This calculation has not been validated in all clinical situations. eGFR's persistently <60 mL/min signify possible Chronic Kidney Disease.    Anion gap 15 5 - 15    Comment: Performed at [facility] [address] [place], [place] [address]  CBC     Status: Abnormal   Collection Time: 04/14/18  2:30 AM  Result Value Ref Range   WBC 7.7 4.0 - 10.5 K/uL   RBC 3.28 (L) 4.22 - 5.81 MIL/uL   Hemoglobin 10.0 (L) 13.0 - 17.0 g/dL   HCT 31.0 (L) 39.0 - 52.0 %   MCV 94.5 78.0 - 100.0 fL   MCH 30.5 26.0 - 34.0 pg   MCHC 32.3 30.0 - 36.0 g/dL   RDW 14.0 11.5 - 15.5 %   Platelets 174 150 - 400 K/uL    Comment: Performed at [facility], [place] [address] [place], [place] [address]  I-stat troponin, ED     Status: Abnormal   Collection Time: 04/14/18  2:39 AM  Result Value Ref Range   Troponin i, poc 0.14 (HH) 0.00 - 0.08 ng/mL   Comment NOTIFIED PHYSICIAN    Comment 3            Comment: Due to the release kinetics of cTnI, a negative result within the  first hours of the onset of symptoms does not rule out myocardial infarction with certainty. If myocardial infarction is still suspected, repeat the test at appropriate intervals.   Heparin level (unfractionated)     Status: Abnormal   Collection Time: 04/14/18 10:00 AM  Result Value Ref Range   Heparin Unfractionated 0.17 (L) 0.30 - 0.70 IU/mL    Comment: (NOTE) If heparin results are below expected values, and patient dosage has  been confirmed, suggest follow up testing of antithrombin III levels. Performed at [facility], [place] [address], [place], [place] [address]     ROS: see hpi Physical Exam: Vitals:   04/14/18 0930 04/14/18 0945  BP: (!) 122/58 127/79  Pulse: 76 76  Resp: 20 14  Temp:    SpO2: 99% 98%     General: alert Nad obese wm , OX3, Wn WD HEENT: [place] EOMI , NOniocteriuc Neck: no JVD  Heart: RRR , no mur., no rub or gallop  Lungs: Faint expiratory wheeze / nonlabored breathing  Abdomen: obese , soft ,nt, nd Extremities: bilat. 1+ pedal edema  Skin: no pedal ulcers  Warm dry / noted Maculopapular rash  Some excoriations  Back/ arms  Neuro: moves all extrem , no overt focal deficits  Dialysis Access: Pos bruit LUA AVF mild aneurysm   Dialysis Orders: Center: [facility]   on mwf . EDW 106kg HD Bath 2k, 3.5 ca  Time 4hr Heparin NONE. Access LUA AVF  Hec 2 mcg IV/HD Mircera 60 mcg  q 2wks ( last on 04/02/18)    Assessment/Plan 1. ESRD -  Plan HD on schedule  Today , no acute need for HD now , can do after cath 2.  NSTEMI/ Chest Pain/ Ho CAD - Cardiology  seeing  3. HO AS s/p TAVR / "last Echo 2018  Stable "   4. Hypertension/volume  - bp 127/79 nin ER on NTG drip , no excess vol , 5. Anemia  -hgb  10.0  eas on hd Next due 5/22 6. Metabolic bone disease -   Iv Vit d on Hd , phos binder  7. DM type 2 - per admit  8. HO Afib with pacer / AICD - currently SR   Ernest Haber, PA-C [place] [phone] 04/14/2018, 11:20 AM

## 2018-04-14 NOTE — Progress Notes (Signed)
Dr. Joelyn Oms rescheduled 5/20 HD tx to 5/21 using same HD orders for 5/20 already modified in Epic. Floor notified

## 2018-04-14 NOTE — ED Notes (Signed)
Pt states that he does not have any Chest pain at this time.

## 2018-04-14 NOTE — ED Notes (Signed)
Nitro paste removed.

## 2018-04-14 NOTE — Progress Notes (Signed)
[place] for heparin Indication: chest pain/ACS  Allergies  Allergen Reactions  . Byetta 10 Mcg Pen [Exenatide] Diarrhea and Nausea And Vomiting  . Codeine Itching    Patient Measurements: Height: 5\' 11"  (180.3 cm) Weight: 241 lb (109.3 kg) IBW/kg (Calculated) : 75.3 Heparin Dosing Weight: 98.7 kg  Vital Signs: Temp: 97.7 F (36.5 C) (05/20 0213) Temp Source: Oral (05/20 0213) BP: 127/79 (05/20 0945) Pulse Rate: 76 (05/20 0945)  Labs: Recent Labs    04/14/18 0230 04/14/18 1000  HGB 10.0*  --   HCT 31.0*  --   PLT 174  --   HEPARINUNFRC  --  0.17*  CREATININE 6.59*  --     Estimated Creatinine Clearance: 11.4 mL/min (A) (by C-G formula based on SCr of 6.59 mg/dL (H)).  Assessment: 77 yo man to start heparin for CP. Initial heparin level is low at 0.17. No bleeding noted.   Goal of Therapy:  Heparin level 0.3-0.7 units/ml Monitor platelets by anticoagulation protocol: Yes   Plan:  Heparin bolus 3000 units IV x 1 Increase heparin gtt to 1600 units/hr Check an 8 hr heparin level Daily heparin level and CBC  Takaya Hyslop, Rande Lawman 04/14/2018,11:17 AM

## 2018-04-14 NOTE — Progress Notes (Signed)
ANTICOAGULATION CONSULT NOTE  Pharmacy Consult:  Heparin Indication: chest pain/ACS  Allergies  Allergen Reactions  . Byetta 10 Mcg Pen [Exenatide] Diarrhea and Nausea And Vomiting  . Codeine Itching    Patient Measurements: Height: 5\' 11"  (180.3 cm) Weight: 241 lb (109.3 kg) IBW/kg (Calculated) : 75.3 Heparin Dosing Weight: 99  kg  Vital Signs: BP: 131/47 (05/20 1635) Pulse Rate: 60 (05/20 1635)  Labs: Recent Labs    04/14/18 0230 04/14/18 1000 04/14/18 1003  HGB 10.0*  --   --   HCT 31.0*  --   --   PLT 174  --   --   HEPARINUNFRC  --  0.17*  --   CREATININE 6.59*  --   --   TROPONINI  --   --  0.36*    Estimated Creatinine Clearance: 11.4 mL/min (A) (by C-G formula based on SCr of 6.59 mg/dL (H)).  Assessment: 27 YOM s/p cath to resume IV heparin 10 hours post sheath removal.  Sheath removed around 1630 per RN note.  No bleeding documented.   Goal of Therapy:  Heparin level 0.3-0.7 units/ml Monitor platelets by anticoagulation protocol: Yes    Plan:  On 04/15/18 at 0230, restart heparin gtt at 1600 units/hr, no bolus Check 8 hr heparin level Daily heparin level and CBC Monitor for s/sx of bleeding / hematoma   Wylee Dorantes D. Mina Marble, PharmD, BCPS, BCCCP Pager:  [phone] 04/14/2018, 5:10 PM

## 2018-04-14 NOTE — ED Triage Notes (Signed)
Pt c/o R sided CP radiating up to neck and shoulder onset last night @ 2230, pt took 1 nitro, pain resolved for an hour then returned, pt took another nitro at that time. Pt c/o worsening shob at this time.  Pt does have pacemaker, HD, MWF, last tx Friday.

## 2018-04-14 NOTE — ED Notes (Signed)
Attempted to have pt sign consent for cardiac cath, pt has questions and will not sign at this time.  Paged cardiology.

## 2018-04-14 NOTE — Consult Note (Addendum)
Cardiology Consultation:   Patient ID: Jeffrey [name]; [id number]; [birth year]   Admit date: 04/14/2018 Date of Consult: 04/14/2018  Primary Care Provider: Thurman Coyer, MD Primary Cardiologist: [name], MD  Primary Electrophysiologist:     Patient Profile:   Jeffrey [name] is a 77 y.o. 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 Hal Hope.  History of Present Illness:   Jeffrey [name] has a history of CAD, last cath 2018 with CTO of RCA. He normally takes 1 nitro SL tablet once monthly for symptoms. Last evening at approximately 11:30 pm, he has a sudden onset of 8/10 substernal crushing chest pain that radiated to both shoulders and into his left neck and jaw. He had associated shortness of breath, but no diaphoresis or nausea. He took a nitro with relief for about 30 min. The pain returned and he took another nitro. He again felt relief, but this was short-lived. When the chest pain came back a third time, he decided to report to the ED. He had to take a third nitro to get in the car, he didn't think he would make it to the ER. On arrival, initial POC troponin was 0.14. EKG with paced rhythm. He was started on heparin drip and nitro drip and is now chest pain free.  He dialyzes on MWF. Mildly volume overloaded, not completely unexpected after the weekend and today being his normal HD day.   Past Medical History:  Diagnosis Date  . Anginal pain ([place])   . Aortic stenosis    a. severe by echo 09/2014  . Atrial fibrillation ([place])    a. not well documented, not on anticoagulation  . CHF (congestive heart failure) ([place])    04/28/17 echo-EF 40%, mod LVH, diastolic  dysfunction  . Claustrophobia   . Complete heart block ([place])   . COPD (chronic obstructive pulmonary disease) ([place])   . Coronary artery disease    a. chronically occluded RCA per cath 09/2014 with collaterals B. cath 05/01/17 chr occ RCA w/collaterals, 60-70% mid LAD,   . CVA (cerebral vascular accident) ([place]) 10/2014   denies residual on 07/11/2015  . ESRD (end stage renal disease) on dialysis [facility])    a. on dialysis; [facility]; MWF, LUE fistula (07/11/2015)  . History of blood transfusion    "related to gallbladder OR"  . History of stomach ulcers   . Hyperlipidemia   . Hypertension   . Iron deficiency anemia   . Myocardial infarction ([place]) 10/2014  . Peripheral vascular disease ([place])   . Pneumonia   . Presence of permanent cardiac pacemaker   . 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 Type II diabetes mellitus ([place])     Past Surgical History:  Procedure Laterality Date  . AV FISTULA PLACEMENT Left 10/19/2014   Procedure: BRACHIOCEPHALIC ARTERIOVENOUS (AV) FISTULA CREATION ;  Surgeon: Conrad Shady Hills, MD;  Location: [place] [facility];  Service: Vascular;  Laterality: Left;  . CARDIAC CATHETERIZATION    . CARDIAC CATHETERIZATION N/A 07/22/2015   Procedure: Right/Left  Heart Cath and Coronary Angiography;  Surgeon: [name], MD;  Location: [place] CV LAB;  Service: Cardiovascular;  Laterality: N/A;  . CATARACT EXTRACTION W/ INTRAOCULAR LENS  IMPLANT, BILATERAL Bilateral 1990's  . CHOLECYSTECTOMY OPEN  1980's  . COLONOSCOPY W/ BIOPSIES AND POLYPECTOMY    . CORONARY ANGIOPLASTY    . EP IMPLANTABLE DEVICE N/A 07/11/2015   Procedure: Pacemaker Implant;  Surgeon: [name], MD;  Location: [facility] CV LAB;  Service: Cardiovascular;  Laterality: N/A;  . ESOPHAGOGASTRODUODENOSCOPY  08/01/2012   Procedure: ESOPHAGOGASTRODUODENOSCOPY (EGD);  Surgeon: [name], MD;  Location: [facility];  Service: Endoscopy;  Laterality: N/A;  . INSERT / REPLACE / REMOVE PACEMAKER  07/11/2015  . INSERTION OF DIALYSIS CATHETER Right 02/02/2015   Procedure: INSERTION OF DIALYSIS CATHETER  RIGHT INTERNAL JUGULAR;  Surgeon: Mal [name], MD;  Location: [facility];  Service: Vascular;  Laterality: Right;  . LEFT AND RIGHT HEART CATHETERIZATION WITH CORONARY ANGIOGRAM N/A 09/30/2014   Procedure: LEFT AND RIGHT HEART CATHETERIZATION WITH CORONARY ANGIOGRAM;  Surgeon: [name], MD;  Location: [facility] CATH LAB;  Service: Cardiovascular;  Laterality: N/A;  . TEE WITHOUT CARDIOVERSION N/A 08/02/2015   Procedure: TRANSESOPHAGEAL ECHOCARDIOGRAM (TEE);  Surgeon: [name], MD;  Location: [place];  Service: Open Heart Surgery;  Laterality: N/A;  . TONSILLECTOMY    . TRANSCATHETER AORTIC VALVE REPLACEMENT, TRANSFEMORAL Left 08/02/2015   Procedure: TRANSCATHETER AORTIC VALVE REPLACEMENT, TRANSFEMORAL;  Surgeon: [name], MD;  Location: [place];  Service: Open Heart Surgery;  Laterality: Left;     Home Medications:  Prior to Admission medications   Medication Sig Start Date End Date Taking? Authorizing Provider  aspirin EC 81 MG tablet Take 1 tablet (81 mg total) by mouth daily. 05/15/17  Yes Eileen Stanford, PA-C  atorvastatin (LIPITOR) 40 MG tablet Take 2 tablets (80 mg total) by mouth daily. 05/03/17  Yes Allie Bossier, MD  cinacalcet (SENSIPAR) 30 MG tablet Take 30 mg by mouth daily with supper.   Yes [provider]  insulin detemir (LEVEMIR) 100 UNIT/ML injection Inject 0.48 mLs (48 Units total) into the skin at bedtime. 05/03/17  Yes Allie Bossier, MD  lidocaine-prilocaine (EMLA) cream Apply 1 application topically once as needed (prior to accessing port).    Yes [provider]  metoprolol tartrate (LOPRESSOR) 25 MG tablet Take 1 tablet (25 mg total) by mouth 2 (two) times daily. 05/03/17  Yes Allie Bossier, MD  nitroGLYCERIN (NITROSTAT) 0.4 MG SL  tablet Place 0.4 mg under the tongue every 5 (five) minutes as needed for chest pain.    Yes [provider]  ranitidine (ZANTAC) 150 MG tablet Take 150 mg by mouth 2 (two) times daily as needed for heartburn.   Yes [provider]    Inpatient Medications: Scheduled Meds: . [START ON 04/15/2018] aspirin EC  81 mg Oral Daily  . atorvastatin  80 mg Oral Daily  . cinacalcet  30 mg Oral Q supper  . famotidine  20 mg Oral BID  . insulin aspart  0-15 Units Subcutaneous TID WC  . insulin aspart  0-5 Units Subcutaneous QHS  . insulin detemir  48 Units Subcutaneous QHS  . metoprolol tartrate  25 mg Oral BID  . sodium chloride flush  3 mL Intravenous Q12H   Continuous Infusions: . sodium chloride    . heparin 1,400 Units/hr (04/14/18 0410)  . nitroGLYCERIN 10 mcg/min (04/14/18 1009)   PRN Meds: sodium chloride,  acetaminophen, ondansetron (ZOFRAN) IV, sodium chloride flush  Allergies:    Allergies  Allergen Reactions  . Byetta 10 Mcg Pen [Exenatide] Diarrhea and Nausea And Vomiting  . Codeine Itching    Social History:   Social History   Socioeconomic History  . Marital status: Married    Spouse name: Not on file  . Number of children: Not on file  . Years of education: Not on file  . Highest education level: Not on file  Occupational History  . Occupation: retired  Scientific laboratory technician  . Financial resource strain: Not on file  . Food insecurity:    Worry: Not on file    Inability: Not on file  . Transportation needs:    Medical: Not on file    Non-medical: Not on file  Tobacco Use  . Smoking status: Former Smoker    Packs/day: 2.00    Years: 32.00    Pack years: 64.00    Last attempt to quit: 11/27/1983    Years since quitting: 34.4  . Smokeless tobacco: Never Used  Substance and Sexual Activity  . Alcohol use: Yes    Comment: rare  . Drug use: No  . Sexual activity: Yes    Birth control/protection: None  Lifestyle  . Physical activity:    Days per  week: Not on file    Minutes per session: Not on file  . Stress: Not on file  Relationships  . Social connections:    Talks on phone: Not on file    Gets together: Not on file    Attends religious service: Not on file    Active member of club or organization: Not on file    Attends meetings of clubs or organizations: Not on file    Relationship status: Not on file  . Intimate partner violence:    Fear of current or ex partner: Not on file    Emotionally abused: Not on file    Physically abused: Not on file    Forced sexual activity: Not on file  Other Topics Concern  . Not on file  Social History Narrative  . Not on file    Family History:    Family History  Problem Relation Age of Onset  . Diabetes Father   . Heart disease Father   . Diabetes Sister   . Alzheimer's disease Mother      ROS:  Please see the history of present illness.   All other ROS reviewed and negative.     Physical Exam/Data:   Vitals:   04/14/18 0845 04/14/18 0900 04/14/18 0930 04/14/18 0945  BP: 128/64 (!) 110/91 (!) 122/58 127/79  Pulse: 80 75 76 76  Resp: 17 18 20 14   Temp:      TempSrc:      SpO2: 97% 99% 99% 98%  Weight:      Height:        Intake/Output Summary (Last 24 hours) at 04/14/2018 1025 Last data filed at 04/14/2018 1002 Gross per 24 hour  Intake 8.08 ml  Output -  Net 8.08 ml   Filed Weights   04/14/18 0337  Weight: 241 lb (109.3 kg)   Body mass index is 33.61 kg/m.  General:  Well nourished, well developed, in no acute distress HEENT: normal Neck: no JVD Vascular: No carotid bruits Cardiac:  normal S1, S2; RRR; no murmur Lungs:  clear to auscultation bilaterally, no wheezing, rhonchi or rales  Abd: soft, nontender, no hepatomegaly  Ext: trace B LE  edema Musculoskeletal:  No deformities, BUE and BLE strength normal and equal, left fistula Skin: warm and dry  Neuro:  CNs 2-12 intact, no focal abnormalities noted Psych:  Normal affect   EKG:  The EKG was  personally reviewed and demonstrates:  Paced rhythm Telemetry:  Telemetry was personally reviewed and demonstrates:  Paced rhythm  Relevant CV Studies:  Heart cath 05/01/17:  Prox LAD lesion, 65 %stenosed.  Prox Cx to Mid Cx lesion, 40 %stenosed.  3rd Mrg lesion, 30 %stenosed.  Mid RCA lesion, 100 %stenosed.  Hemodynamic findings consistent with mild pulmonary hypertension.   Mild elevation of right heart pressures with mild pulmonary hypertension.  Well-seated TAVR valve.  Multi-vessel CAD with a calcified proximal to mid LAD with 60-70% proximal LAD stenosis, which has slightly progressed from previously, 30-40% mid AV groove circumflex stenoses with 30% stenosis in the distal marginal branch; and previously noted old total occlusion of the mid RCA with left-to-right collaterals to the PDA vessel.  RECOMMENDATION: Recommend an initial increased medical therapy trial.  If patient develops recurrent symptomatology, consider possible PCI with atherectomy to the proximal LAD lesion.   Echo 04/28/17: Study Conclusions - Left ventricle: The cavity size was normal. Wall thickness was   increased in a pattern of moderate LVH. Systolic function was   mildly to moderately reduced. The estimated ejection fraction was   40%. Diffuse hypokinesis. Doppler parameters are consistent with   restrictive physiology, indicative of decreased left ventricular   diastolic compliance and/or increased left atrial pressure.   Doppler parameters are consistent with high ventricular filling   pressure. - Regional wall motion abnormality: Hypokinesis of the basal   inferoseptal myocardium; moderate hypokinesis of the apical   anterior, apical septal, apical lateral, and apical myocardium. 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 No significant bioprosthetic valvular   stenosis. There was no regurgitation. Peak velocity (S): 290   cm/s. Mean gradient  (S): 15 mm Hg. Valve area (VTI): 2.16 cm^2.   Valve area (Vmax): 2.08 cm^2. Valve area (Vmean): 2.15 cm^2. - Mitral valve: Calcified annulus. Mildly thickened leaflets .   There was moderate regurgitation. There is some acoustic   shadowing from mitral annular calcification. - Left atrium: The atrium was severely dilated. - Right ventricle: Pacer wire or catheter noted in right ventricle. - Right atrium: The atrium was mildly dilated. - Tricuspid valve: There was mild regurgitation. - Pulmonic valve: There was mild regurgitation.  Impressions: - I compared today&'s study to the study dated 07/19/16. LVEF appears   slightly better in some views and worse in others, but appears to   be in the 40% range. The apex, apical septum, anteroapical wall,   and anterolateral walls may be slightly more hypokinetic than   before.  Laboratory Data:  Chemistry Recent Labs  Lab 04/14/18 0230  NA 134*  K 4.5  CL 97*  CO2 22  GLUCOSE 233*  BUN 61*  CREATININE 6.59*  CALCIUM 8.8*  GFRNONAA 7*  GFRAA 8*  ANIONGAP 15    No results for input(s): PROT, ALBUMIN, AST, ALT, ALKPHOS, BILITOT in the last 168 hours. Hematology Recent Labs  Lab 04/14/18 0230  WBC 7.7  RBC 3.28*  HGB 10.0*  HCT 31.0*  MCV 94.5  MCH 30.5  MCHC 32.3  RDW 14.0  PLT 174   Cardiac EnzymesNo results for input(s): TROPONINI in the last 168 hours.  Recent Labs  Lab 04/14/18 0239  TROPIPOC 0.14*  BNPNo results for input(s): BNP, PROBNP in the last 168 hours.  DDimer No results for input(s): DDIMER in the last 168 hours.  Radiology/Studies:  Dg Chest 2 View  Result Date: 04/14/2018 CLINICAL DATA:  Chest pain EXAM: CHEST - 2 VIEW COMPARISON:  11/14/2017 FINDINGS: Right-sided pacing device as before, incompletely visualized ventricular leads. Cardiac valve. Incomplete inclusion of the lung bases. Hyperinflation. No pleural effusion. Cardiomegaly. Aortic atherosclerosis. No pneumothorax. IMPRESSION: No active  cardiopulmonary disease.  Cardiomegaly Electronically Signed   By: Donavan Foil M.D.   On: 04/14/2018 03:17    Assessment and Plan:   1. Chest pain, CAD s/p heart cath in 2018 with occlusion of the RCA, 65% stenosis of the proximal LAD, and 40% stenosis of the proximal to mid Cx - troponin 0.14 at 0239 (3 hrs after initial onset of chest pain) - repeat troponin pending - EKG with paced rhythm (A-sensed) - heparin and nitro drips started, now chest pain free - would favor repeat heart catheterization today given his positive troponin and known CAD - he is NPO   2. Chronic systolic and diastolic heart failure - volume status managed by nephrology and HD - mild lower extremity edema not out of proportion to expected edema on HD day after the weekend - no further shortness of breath   3. AS s/p TAVR, stable by last echo 2018   4. ESRD on HD - to manage volume status    For questions or updates, please contact [place] Please consult [email or web address] for contact info under Cardiology/STEMI.   Signed, Ledora Bottcher, [place]  04/14/2018 10:25 AM   Agree with note by Fabian Sharp PA-C  Patient with known CAD occluded dominant right with moderate proximal LAD disease by recent cath a year ago.  He has had TAVR, and has chronic systolic heart failure as well as chronic renal insufficiency on hemodialysis.  He was admitted with accelerated angina having taken 3 sublingual nitroglycerin.  His rhythm is paced.  His first enzymes are minimally positive.  He will need diagnostic coronary angiography to define his anatomy and rule out progression of disease in his proximal LAD.  His exam is benign.  Lorretta Harp, M.D., [place], [facility], [place] [address] [place], [place]  [address]  [phone] 04/14/2018 12:53 PM

## 2018-04-14 NOTE — ED Notes (Addendum)
Pt states chest pain has came back and is now at a 5 states that it is a sharp pain that goes to his back

## 2018-04-14 NOTE — Progress Notes (Addendum)
Site area: rt groin fa sheath Site Prior to Removal:  Level 0 Pressure Applied For: 20 minutes Manual:   yes Patient Status During Pull:  stable Post Pull Site:  Level 0 Post Pull Instructions Given:  yes Post Pull Pulses Present: palpable Dressing Applied:  Gauze and tegaderm Bedrest begins @ 7373 Comments:

## 2018-04-14 NOTE — Progress Notes (Signed)
ANTICOAGULATION CONSULT NOTE - Initial Consult  Pharmacy Consult for heparin Indication: chest pain/ACS  Allergies  Allergen Reactions  . Byetta 10 Mcg Pen [Exenatide] Diarrhea and Nausea And Vomiting  . Codeine Itching    Patient Measurements: Height: 5\' 11"  (180.3 cm) Weight: 241 lb (109.3 kg) IBW/kg (Calculated) : 75.3 Heparin Dosing Weight: 98.7 kg  Vital Signs: Temp: 97.7 F (36.5 C) (05/20 0213) Temp Source: Oral (05/20 0213) BP: 105/47 (05/20 0330) Pulse Rate: 66 (05/20 0330)  Labs: Recent Labs    04/14/18 0230  HGB 10.0*  HCT 31.0*  PLT 174  CREATININE 6.59*    Estimated Creatinine Clearance: 11.4 mL/min (A) (by C-G formula based on SCr of 6.59 mg/dL (H)).   Medical History: Past Medical History:  Diagnosis Date  . Anginal pain ([place])   . Aortic stenosis    a. severe by echo 09/2014  . Atrial fibrillation ([place])    a. not well documented, not on anticoagulation  . CHF (congestive heart failure) ([place])    04/28/17 echo-EF 40%, mod LVH, diastolic dysfunction  . Claustrophobia   . Complete heart block ([place])   . COPD (chronic obstructive pulmonary disease) ([place])   . Coronary artery disease    a. chronically occluded RCA per cath 09/2014 with collaterals B. cath 05/01/17 chr occ RCA w/collaterals, 60-70% mid LAD,   . CVA (cerebral vascular accident) ([place]) 10/2014   denies residual on 07/11/2015  . ESRD (end stage renal disease) on dialysis [facility])    a. on dialysis; [facility]; MWF, LUE fistula (07/11/2015)  . History of blood transfusion    "related to gallbladder OR"  . History of stomach ulcers   . Hyperlipidemia   . Hypertension   . Iron deficiency anemia   . Myocardial infarction ([place]) 10/2014  . Peripheral vascular disease ([place])   . Pneumonia   . Presence of permanent cardiac pacemaker   . Renal insufficiency   . 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 Type II diabetes mellitus ([facility])     Medications:  See medication history  Assessment: 77 yo man to start heparin for CP.  He was not on anticoagulation PTA Goal of Therapy:  Heparin level 0.3-0.7 units/ml Monitor platelets by anticoagulation protocol: Yes   Plan:  Heparin 4000 unit bolus and drip at 1400 units/hr Check heparin level in 6 hours Daily HL and CBC while on heparin Monitor for bleeding complications  Jeffrey [name] 04/14/2018,3:51 AM

## 2018-04-14 NOTE — ED Notes (Signed)
Notified Yates MD of critical troponin 0.36.

## 2018-04-14 NOTE — ED Notes (Signed)
Pt has [employer] pacemaker, this RN called [phone], representative requested to come to [facility] and interrogate device as we do not have equipment for that device. Representative will be here within 3 hours.

## 2018-04-15 ENCOUNTER — Observation Stay (HOSPITAL_COMMUNITY): Payer: Medicare Other

## 2018-04-15 ENCOUNTER — Encounter (HOSPITAL_COMMUNITY): Payer: Self-pay | Admitting: Cardiovascular Disease

## 2018-04-15 ENCOUNTER — Other Ambulatory Visit: Payer: Self-pay

## 2018-04-15 DIAGNOSIS — E1122 Type 2 diabetes mellitus with diabetic chronic kidney disease: Secondary | ICD-10-CM | POA: Diagnosis present

## 2018-04-15 DIAGNOSIS — Z794 Long term (current) use of insulin: Secondary | ICD-10-CM

## 2018-04-15 DIAGNOSIS — I249 Acute ischemic heart disease, unspecified: Secondary | ICD-10-CM | POA: Diagnosis not present

## 2018-04-15 DIAGNOSIS — E1151 Type 2 diabetes mellitus with diabetic peripheral angiopathy without gangrene: Secondary | ICD-10-CM | POA: Diagnosis present

## 2018-04-15 DIAGNOSIS — Z8249 Family history of ischemic heart disease and other diseases of the circulatory system: Secondary | ICD-10-CM | POA: Diagnosis not present

## 2018-04-15 DIAGNOSIS — Z952 Presence of prosthetic heart valve: Secondary | ICD-10-CM | POA: Diagnosis not present

## 2018-04-15 DIAGNOSIS — I25119 Atherosclerotic heart disease of native coronary artery with unspecified angina pectoris: Secondary | ICD-10-CM

## 2018-04-15 DIAGNOSIS — Z9111 Patient's noncompliance with dietary regimen: Secondary | ICD-10-CM | POA: Diagnosis not present

## 2018-04-15 DIAGNOSIS — Z8673 Personal history of transient ischemic attack (TIA), and cerebral infarction without residual deficits: Secondary | ICD-10-CM | POA: Diagnosis not present

## 2018-04-15 DIAGNOSIS — L299 Pruritus, unspecified: Secondary | ICD-10-CM | POA: Diagnosis present

## 2018-04-15 DIAGNOSIS — I482 Chronic atrial fibrillation: Secondary | ICD-10-CM | POA: Diagnosis not present

## 2018-04-15 DIAGNOSIS — N2581 Secondary hyperparathyroidism of renal origin: Secondary | ICD-10-CM | POA: Diagnosis present

## 2018-04-15 DIAGNOSIS — I351 Nonrheumatic aortic (valve) insufficiency: Secondary | ICD-10-CM | POA: Diagnosis not present

## 2018-04-15 DIAGNOSIS — Z95810 Presence of automatic (implantable) cardiac defibrillator: Secondary | ICD-10-CM | POA: Diagnosis not present

## 2018-04-15 DIAGNOSIS — Z950 Presence of cardiac pacemaker: Secondary | ICD-10-CM | POA: Diagnosis not present

## 2018-04-15 DIAGNOSIS — Z833 Family history of diabetes mellitus: Secondary | ICD-10-CM | POA: Diagnosis not present

## 2018-04-15 DIAGNOSIS — R748 Abnormal levels of other serum enzymes: Secondary | ICD-10-CM | POA: Diagnosis not present

## 2018-04-15 DIAGNOSIS — E785 Hyperlipidemia, unspecified: Secondary | ICD-10-CM | POA: Diagnosis present

## 2018-04-15 DIAGNOSIS — I214 Non-ST elevation (NSTEMI) myocardial infarction: Secondary | ICD-10-CM | POA: Diagnosis present

## 2018-04-15 DIAGNOSIS — Z87891 Personal history of nicotine dependence: Secondary | ICD-10-CM | POA: Diagnosis not present

## 2018-04-15 DIAGNOSIS — I4891 Unspecified atrial fibrillation: Secondary | ICD-10-CM

## 2018-04-15 DIAGNOSIS — I5042 Chronic combined systolic (congestive) and diastolic (congestive) heart failure: Secondary | ICD-10-CM

## 2018-04-15 DIAGNOSIS — I2510 Atherosclerotic heart disease of native coronary artery without angina pectoris: Secondary | ICD-10-CM | POA: Diagnosis not present

## 2018-04-15 DIAGNOSIS — I252 Old myocardial infarction: Secondary | ICD-10-CM | POA: Diagnosis not present

## 2018-04-15 DIAGNOSIS — Z79899 Other long term (current) drug therapy: Secondary | ICD-10-CM | POA: Diagnosis not present

## 2018-04-15 DIAGNOSIS — Z9981 Dependence on supplemental oxygen: Secondary | ICD-10-CM | POA: Diagnosis not present

## 2018-04-15 DIAGNOSIS — Z992 Dependence on renal dialysis: Secondary | ICD-10-CM | POA: Diagnosis not present

## 2018-04-15 DIAGNOSIS — N186 End stage renal disease: Secondary | ICD-10-CM | POA: Diagnosis present

## 2018-04-15 DIAGNOSIS — Z7982 Long term (current) use of aspirin: Secondary | ICD-10-CM | POA: Diagnosis not present

## 2018-04-15 DIAGNOSIS — I132 Hypertensive heart and chronic kidney disease with heart failure and with stage 5 chronic kidney disease, or end stage renal disease: Secondary | ICD-10-CM | POA: Diagnosis present

## 2018-04-15 DIAGNOSIS — F40240 Claustrophobia: Secondary | ICD-10-CM | POA: Diagnosis present

## 2018-04-15 DIAGNOSIS — R079 Chest pain, unspecified: Secondary | ICD-10-CM | POA: Diagnosis present

## 2018-04-15 DIAGNOSIS — I25110 Atherosclerotic heart disease of native coronary artery with unstable angina pectoris: Secondary | ICD-10-CM

## 2018-04-15 DIAGNOSIS — Z953 Presence of xenogenic heart valve: Secondary | ICD-10-CM | POA: Diagnosis not present

## 2018-04-15 LAB — TROPONIN I: Troponin I: 0.47 ng/mL (ref <0.03)

## 2018-04-15 LAB — BASIC METABOLIC PANEL
Anion gap: 15 (ref 5–15)
BUN: 68 mg/dL — AB (ref 6–20)
CHLORIDE: 98 mmol/L — AB (ref 101–111)
CO2: 23 mmol/L (ref 22–32)
Calcium: 8.5 mg/dL — ABNORMAL LOW (ref 8.9–10.3)
Creatinine, Ser: 7.11 mg/dL — ABNORMAL HIGH (ref 0.61–1.24)
GFR calc Af Amer: 8 mL/min — ABNORMAL LOW (ref >60)
GFR, EST NON AFRICAN AMERICAN: 6 mL/min — AB (ref >60)
GLUCOSE: 162 mg/dL — AB (ref 65–99)
POTASSIUM: 4.7 mmol/L (ref 3.5–5.1)
Sodium: 136 mmol/L (ref 135–145)

## 2018-04-15 LAB — CBC
HCT: 31.1 % — ABNORMAL LOW (ref 39.0–52.0)
Hemoglobin: 10.3 g/dL — ABNORMAL LOW (ref 13.0–17.0)
MCH: 30.9 pg (ref 26.0–34.0)
MCHC: 33.1 g/dL (ref 30.0–36.0)
MCV: 93.4 fL (ref 78.0–100.0)
PLATELETS: 175 10*3/uL (ref 150–400)
RBC: 3.33 MIL/uL — AB (ref 4.22–5.81)
RDW: 13.9 % (ref 11.5–15.5)
WBC: 10.2 10*3/uL (ref 4.0–10.5)

## 2018-04-15 LAB — GLUCOSE, CAPILLARY
GLUCOSE-CAPILLARY: 114 mg/dL — AB (ref 65–99)
GLUCOSE-CAPILLARY: 253 mg/dL — AB (ref 65–99)
Glucose-Capillary: 259 mg/dL — ABNORMAL HIGH (ref 65–99)

## 2018-04-15 LAB — LIPID PANEL
CHOL/HDL RATIO: 3.6 ratio
Cholesterol: 80 mg/dL (ref 0–200)
HDL: 22 mg/dL — ABNORMAL LOW (ref >40)
LDL Cholesterol: 22 mg/dL (ref 0–99)
Triglycerides: 179 mg/dL — ABNORMAL HIGH (ref <150)
VLDL: 36 mg/dL (ref 0–40)

## 2018-04-15 LAB — HEPARIN LEVEL (UNFRACTIONATED): Heparin Unfractionated: 0.41 IU/mL (ref 0.30–0.70)

## 2018-04-15 MED ORDER — FAMOTIDINE 20 MG PO TABS
20.0000 mg | ORAL_TABLET | Freq: Every day | ORAL | Status: DC
  Administered 2018-04-16 – 2018-04-19 (×3): 20 mg via ORAL
  Filled 2018-04-15 (×3): qty 1

## 2018-04-15 MED FILL — Heparin Sod (Porcine)-NaCl IV Soln 1000 Unit/500ML-0.9%: INTRAVENOUS | Qty: 1000 | Status: AC

## 2018-04-15 NOTE — Plan of Care (Signed)
Neuro: Pt A&Ox4, neuro exam WNL at this time. Will continue to monitor.   Respiratory: Pt on 3-4L o2 via [place]. Pts o2 sats WNL at this time, but does drop during episodes of sleep. Pt states he was told to wear a cpap before, but can not tolerate, MDs notified.   Cardiovascular: Pt remains Vpaced at this time. Cardiology came to bedside today and spoke with pt and family regarding decision to not go forward with CABG. Pt remains on IV heparin and nitro. BP WNL at this time.     GI/GU: Pt with low urine output, but received HD today, 3L off. No bm, PO intake adequate with good appetite.   Skin: Skin intact with no s/s of skin breakdown at this time. Pt able to reposition independently.    Pain: Pt with no reports of pain throughout shift.    Events: NO acute events throughout shift. Pts plan of care to continue with current regimen, Family updated and no further questions at this time.

## 2018-04-15 NOTE — Care Management Obs Status (Signed)
[facility] NOTIFICATION   Patient Details  Name: Jeffrey [name] MRN: [id number] Date of Birth: 08-12-1941   Medicare Observation Status Notification Given:  Yes    Carles Collet, RN 04/15/2018, 2:55 PM

## 2018-04-15 NOTE — Progress Notes (Signed)
[facility] PROGRESS NOTE  Jeffrey [name]:[id number] DOB: 14-Dec-1940 DOA: 04/14/2018  PCP: Thurman Coyer, MD  Brief History/Interval Summary: 77 year old Caucasian male with a past medical history of diabetes, aortic stenosis status post TAVR, pacemaker placement, peripheral vascular disease hypertension, end-stage renal disease on hemodialysis, history of stroke,, coronary artery disease presented with chest pain.  He was seen by cardiology.  He underwent cardiac catheterization which showed multivessel disease.  Cardiothoracic surgery was consulted by cardiology.  Reason for Visit: Coronary artery disease  Consultants: Cardiology.  Nephrology.  Cardiothoracic surgery.  Procedures:   Cardiac catheterization 5/20 Conclusion      Ost Cx lesion is 95% stenosed.  Prox LAD-1 lesion is 60% stenosed.  Prox LAD-2 lesion is 70% stenosed.  Prox RCA to Dist RCA lesion is 100% stenosed.  Prox Cx to Mid Cx lesion is 30% stenosed.  Ost 2nd Diag lesion is 70% stenosed.   Significant multivessel coronary calcification with 3 vessel CAD, and 60% proximal LAD stenosis, 70% calcified stenosis between the first and second diagonal vessel; dominant left circumflex coronary artery with new 90-95% ostial stenosis and diffuse 30% mid stenosis; total chronic mid RCA occlusion.  There is left to right collateralization to the distal RCA.  Well-seated TAVR valve.  RECOMMENDATION: With the patient's significant coronary calcification, and significant multivessel CAD with high-grade ostial circumflex stenosis in a large dominant circumflex vessel, calcified LAD disease and left-to-right collateralization to the distal RCA, will ask Dr. Ricard Dillon to reevaluate for consideration of possible CABG revascularization surgery in this patient with a history of prior TAVR.    Antibiotics: None  Subjective/Interval History: Patient seen while he was getting dialyzed.  He denies any chest pain  shortness of breath.  He denies any nausea or vomiting.  Waiting to talk to the cardiothoracic surgeon.  ROS: Denies any headaches.  Objective:  Vital Signs  Vitals:   04/15/18 0915 04/15/18 0930 04/15/18 1000 04/15/18 1030  BP: (!) 110/53 (!) 124/55 (!) 110/54 (!) 99/54  Pulse: 69 69 67 76  Resp:      Temp:      TempSrc:      SpO2:      Weight:      Height:        Intake/Output Summary (Last 24 hours) at 04/15/2018 1048 Last data filed at 04/15/2018 0700 Gross per 24 hour  Intake 361.48 ml  Output 200 ml  Net 161.48 ml   Filed Weights   04/14/18 0337 04/15/18 0800  Weight: 109.3 kg (241 lb) 109.5 kg (241 lb 6.5 oz)    General appearance: alert, cooperative, appears stated age and no distress Head: Normocephalic, without obvious abnormality, atraumatic Resp: clear to auscultation bilaterally Cardio: regular rate and rhythm, S1, S2 normal, no murmur, click, rub or gallop GI: soft, non-tender; bowel sounds normal; no masses,  no organomegaly Extremities: extremities normal, atraumatic, no cyanosis or edema Neurologic: Alert and oriented x3.  No focal neurological deficits.  Lab Results:  Data Reviewed: I have personally reviewed following labs and imaging studies  CBC: Recent Labs  Lab 04/14/18 0230 04/15/18 0311  WBC 7.7 10.2  HGB 10.0* 10.3*  HCT 31.0* 31.1*  MCV 94.5 93.4  PLT 174 250    Basic Metabolic Panel: Recent Labs  Lab 04/14/18 0230 04/15/18 0311  NA 134* 136  K 4.5 4.7  CL 97* 98*  CO2 22 23  GLUCOSE 233* 162*  BUN 61* 68*  CREATININE 6.59* 7.11*  CALCIUM 8.8* 8.5*  GFR: Estimated Creatinine Clearance: 10.6 mL/min (A) (by C-G formula based on SCr of 7.11 mg/dL (H)).  Cardiac Enzymes: Recent Labs  Lab 04/14/18 1003 04/14/18 1714 04/14/18 2346  TROPONINI 0.36* 0.53* 0.47*    HbA1C: Recent Labs    04/14/18 1003  HGBA1C 7.4*    CBG: Recent Labs  Lab 04/14/18 1323 04/14/18 1616 04/14/18 1743 04/14/18 2137  GLUCAP  125* 124* 112* 180*    Lipid Profile: Recent Labs    04/15/18 0311  CHOL 80  HDL 22*  LDLCALC 22  TRIG 179*  CHOLHDL 3.6    Thyroid Function Tests: Recent Labs    04/14/18 1003  TSH 3.286     Recent Results (from the past 240 hour(s))  MRSA PCR Screening     Status: None   Collection Time: 04/14/18  6:01 PM  Result Value Ref Range Status   MRSA by PCR NEGATIVE NEGATIVE Final    Comment:        The GeneXpert MRSA Assay (FDA approved for NASAL specimens only), is one component of a comprehensive MRSA colonization surveillance program. It is not intended to diagnose MRSA infection nor to guide or monitor treatment for MRSA infections. Performed at [facility], [place] [address] [place], [place] [address]       Radiology Studies: Dg Chest 2 View  Result Date: 04/14/2018 CLINICAL DATA:  Chest pain EXAM: CHEST - 2 VIEW COMPARISON:  11/14/2017 FINDINGS: Right-sided pacing device as before, incompletely visualized ventricular leads. Cardiac valve. Incomplete inclusion of the lung bases. Hyperinflation. No pleural effusion. Cardiomegaly. Aortic atherosclerosis. No pneumothorax. IMPRESSION: No active cardiopulmonary disease.  Cardiomegaly Electronically Signed   By: Donavan Foil M.D.   On: 04/14/2018 03:17     Medications:  Scheduled: . aspirin EC  81 mg Oral Daily  . atorvastatin  80 mg Oral q1800  . cinacalcet  30 mg Oral Q supper  . [START ON 04/16/2018] darbepoetin (ARANESP) injection - DIALYSIS  60 mcg Intravenous Q Wed-HD  . [START ON 04/16/2018] doxercalciferol  2 mcg Intravenous Q M,W,F-HD  . famotidine  20 mg Oral BID  . insulin aspart  0-15 Units Subcutaneous TID WC  . insulin aspart  0-5 Units Subcutaneous QHS  . insulin detemir  48 Units Subcutaneous QHS  . metoprolol tartrate  25 mg Oral BID  . sodium chloride flush  3 mL Intravenous Q12H  . sodium chloride flush  3 mL Intravenous Q12H  . triamcinolone 0.1 % cream : eucerin   Topical TID    Continuous: . sodium chloride    . sodium chloride    . heparin 1,600 Units/hr (04/15/18 0700)  . nitroGLYCERIN 10 mcg/min (04/15/18 0700)   JHE:RDEYCX chloride, sodium chloride, acetaminophen, calcium carbonate (dosed in mg elemental calcium), camphor-menthol **AND** hydrOXYzine, docusate sodium, feeding supplement (NEPRO CARB STEADY), ondansetron (ZOFRAN) IV, sodium chloride flush, sodium chloride flush, sorbitol, zolpidem  Assessment/Plan:    Non-STEMI with a known history of coronary artery disease Patient seen by cardiology.  Underwent cardiac catheterization as mentioned above.  Patient with significant disease.  Cardiothoracic surgery has been consulted for further management.  Patient is currently on heparin infusion.  He is on nitroglycerin infusion.  Continue aspirin and beta-blocker.  He is on a statin.  End-stage renal disease on hemodialysis He is on a Monday Wednesday Friday schedule.  Nephrology is following.  He is being dialyzed today as he could not be dialyzed yesterday.[name]  History of aortic stenosis status post TAVR Stable.  Essential hypertension Monitor blood pressures closely.  Hyperlipidemia Continue statin.  LDL 22.  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  Continue Levemir and sliding scale coverage.  Chronic systolic CHF EF was 60% on echocardiogram from June.  Seems to be adequately compensated.  History of atrial fibrillation status post pacemaker Stable.  Not noted to be on anticoagulation as it was felt that Coumadin caused a rash.  Will defer to cardiologist.  Rash A diffuse maculopapular eruption was primarily involving arms and back.  Apparently is quite pruritic.  Will continue topical agents for now.  Apparently triamcinolone ointment has not provided any relief.  He will benefit from outpatient dermatology evaluation.  Hypoxia while sleeping Nursing staff reported that whenever patient falls asleep her  saturations dropped.  His lungs sounded clear to auscultation today.  He could have obstructive sleep apnea which will need to be pursued as an outpatient.  Normocytic anemia Likely has anemia of chronic disease.  Continue to monitor hemoglobin.   DVT Prophylaxis: Currently on IV heparin Code Status: Full code Family Communication: Discussed with the patient Disposition Plan: As outlined above.  Await cardiothoracic surgery input.    LOS: 0 days   [facility] Pager [phone] 04/15/2018, 10:48 AM  If 7PM-7AM, please contact night-coverage at [email or web address], password [facility]

## 2018-04-15 NOTE — Consult Note (Addendum)
[address]       [place] [address]             [phone]        Sinan G Shelburne Livengood Medical Record #[id number] Date of Birth: [birth year]  Referring: No ref. provider found Primary Care: [name], Dianna Rossetti, MD Primary Cardiologist:Steven Caryl Comes, MD  Chief Complaint:    Chief Complaint  Patient presents with  . Chest Pain    History of Present Illness:   The patient is a 77 year old male who presented to the emergency department yesterday with chest pain.  He has a significant past medical history including diabetes mellitus, status post TAVR, permanent pacemaker placement for complete heart block , peripheral vascular disease, hypertension, hyperlipidemia, end-stage renal disease on Monday Wednesday Friday hemodialysis, previous CVA, COPD, diastolic congestive heart failure, atrial fibrillation and coronary artery disease.  On presentation he had developed chest pain which was squeezing in nature.  There is some radiation to his mouth as well as arms and shoulders.  It was also associated with some shortness of breath.  He took a nitroglycerin and also repeated approximately 30 minutes later.  The pain intensified over time and he presented to the emergency department.  Of note the patient has a recent diagnosis of pneumonia and was given 2 different types of antibiotics on hemodialysis for the last 3 sessions.  In the emergency department he was started on nitroglycerin and heparin drip.  Initial troponin high was 0.14.  Peak troponin is 0.53 and he has ruled in for non-STEMI.  Cardiology consultation was obtained.  A cardiac catheterization was done and results are listed below.  Findings included significant three-vessel disease including total chronic occlusion of the mid RCA with left-to-right collaterals.  Echocardiogram is currently pending.  Nephrology is assisting with management during hospitalization with his hemodialysis treatments.  We are asked to see  the patient in cardiothoracic surgical consultation for consideration of coronary artery surgical revascularization.    Current Activity/ Functional Status: Patient is independent with mobility/ambulation, transfers, ADL's, IADL's. He does use a cane d/t right knee pain. He does deive himself to and from dialysis   Zubrod Score: At the time of surgery this patient's most appropriate activity status/level should be described as: []     0    Normal activity, no symptoms []     1    Restricted in physical strenuous activity but ambulatory, able to do out light work [x]     2    Ambulatory and capable of self care, unable to do work activities, up and about                 more than 50%  Of the time                            []     3    Only limited self care, in bed greater than 50% of waking hours []     4    Completely disabled, no self care, confined to bed or chair []     5    Moribund  Past Medical History:  Diagnosis Date  . Anginal pain ([place])   . Aortic stenosis    a. severe by echo 09/2014  . Atrial fibrillation ([place])    a. not well documented, not on anticoagulation  . CHF (congestive heart failure) ([place])    04/28/17 echo-EF 40%, mod  LVH, diastolic dysfunction  . Claustrophobia   . Complete heart block ([place])   . COPD (chronic obstructive pulmonary disease) ([place])   . Coronary artery disease    a. chronically occluded RCA per cath 09/2014 with collaterals B. cath 05/01/17 chr occ RCA w/collaterals, 60-70% mid LAD,   . CVA (cerebral vascular accident) ([place]) 10/2014   denies residual on 07/11/2015  . ESRD (end stage renal disease) on dialysis [facility])    a. on dialysis; [facility]; MWF, LUE fistula (07/11/2015)  . History of blood transfusion    "related to gallbladder OR"  . History of stomach ulcers   . Hyperlipidemia   . Hypertension   . Iron deficiency anemia   . Myocardial infarction ([place]) 10/2014  . Peripheral vascular disease ([place])   . Pneumonia   . Presence of permanent  cardiac pacemaker   . 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 Type II diabetes mellitus ([place])     Past Surgical History:  Procedure Laterality Date  . AV FISTULA PLACEMENT Left 10/19/2014   Procedure: BRACHIOCEPHALIC ARTERIOVENOUS (AV) FISTULA CREATION ;  Surgeon: Conrad Oswego, MD;  Location: [place];  Service: Vascular;  Laterality: Left;  . CARDIAC CATHETERIZATION    . CARDIAC CATHETERIZATION N/A 07/22/2015   Procedure: Right/Left Heart Cath and Coronary Angiography;  Surgeon: [name], MD;  Location: [place] CV LAB;  Service: Cardiovascular;  Laterality: N/A;  . CATARACT EXTRACTION W/ INTRAOCULAR LENS  IMPLANT, BILATERAL Bilateral 1990's  . CHOLECYSTECTOMY OPEN  1980's  . COLONOSCOPY W/ BIOPSIES AND POLYPECTOMY    . CORONARY ANGIOPLASTY    . EP IMPLANTABLE DEVICE N/A 07/11/2015   Procedure: Pacemaker Implant;  Surgeon: [name], MD;  Location: [place] CV LAB;  Service: Cardiovascular;  Laterality: N/A;  . ESOPHAGOGASTRODUODENOSCOPY  08/01/2012   Procedure: ESOPHAGOGASTRODUODENOSCOPY (EGD);  Surgeon: [name], MD;  Location: [facility];  Service: Endoscopy;  Laterality: N/A;  . INSERT / REPLACE / REMOVE PACEMAKER  07/11/2015  . INSERTION OF DIALYSIS CATHETER Right 02/02/2015   Procedure: INSERTION OF DIALYSIS CATHETER  RIGHT INTERNAL JUGULAR;  Surgeon: Mal [name], MD;  Location: [place];  Service: Vascular;  Laterality: Right;  . LEFT AND RIGHT HEART CATHETERIZATION WITH CORONARY ANGIOGRAM N/A 09/30/2014   Procedure: LEFT AND RIGHT HEART CATHETERIZATION WITH CORONARY ANGIOGRAM;  Surgeon: [name], MD;  Location: [facility] CATH LAB;  Service: Cardiovascular;  Laterality: N/A;  . LEFT HEART CATH AND CORONARY ANGIOGRAPHY N/A 04/14/2018   Procedure: LEFT HEART CATH AND CORONARY ANGIOGRAPHY;  Surgeon: [name], MD;  Location: [place] CV  LAB;  Service: Cardiovascular;  Laterality: N/A;  . TEE WITHOUT CARDIOVERSION N/A 08/02/2015   Procedure: TRANSESOPHAGEAL ECHOCARDIOGRAM (TEE);  Surgeon: [name], MD;  Location: [facility] [facility];  Service: Open Heart Surgery;  Laterality: N/A;  . TONSILLECTOMY    . TRANSCATHETER AORTIC VALVE REPLACEMENT, TRANSFEMORAL Left 08/02/2015   Procedure: TRANSCATHETER AORTIC VALVE REPLACEMENT, TRANSFEMORAL;  Surgeon: [name], MD;  Location: [facility];  Service: Open Heart Surgery;  Laterality: Left;    Social History   Tobacco Use  Smoking Status Former Smoker  . Packs/day: 2.00  . Years: 32.00  . Pack years: 64.00  . Last attempt to quit: 11/27/1983  . Years since quitting: 34.4  Smokeless Tobacco Never Used    Social History   Substance and Sexual Activity  Alcohol Use Yes   Comment: rare     Allergies  Allergen Reactions  . Byetta 10 Mcg Pen [Exenatide] Diarrhea and Nausea And Vomiting  . Codeine Itching    Current Facility-Administered Medications  Medication Dose Route Frequency Provider Last Rate Last Dose  . 0.9 %  sodium chloride infusion  250 mL Intravenous PRN Karmen Bongo, MD      . 0.9 %  sodium chloride infusion  250 mL Intravenous PRN [name], MD      . acetaminophen (TYLENOL) tablet 650 mg  650 mg Oral Q4H PRN Karmen Bongo, MD      . aspirin EC tablet 81 mg  81 mg Oral Daily Karmen Bongo, MD      . atorvastatin (LIPITOR) tablet 80 mg  80 mg Oral q1800 [name], MD   80 mg at 04/14/18 1742  . calcium carbonate (dosed in mg elemental calcium) suspension 500 mg of elemental calcium  500 mg of elemental calcium Oral Q6H PRN Karmen Bongo, MD      . camphor-menthol [facility]) lotion 1 application  1 application Topical T6A PRN Karmen Bongo, MD       And  . hydrOXYzine (ATARAX/VISTARIL) tablet 25 mg  25 mg Oral Q8H PRN Karmen Bongo, MD      . cinacalcet [facility]) tablet 30 mg  30 mg Oral Q supper Karmen Bongo, MD      . Derrill Memo ON 04/16/2018]  Darbepoetin Alfa (ARANESP) injection 60 mcg  60 mcg Intravenous Q Wed-HD Ernest Haber, PA-C      . docusate sodium [facility]) enema 283 mg  1 enema Rectal PRN Karmen Bongo, MD      . Derrill Memo ON 04/16/2018] doxercalciferol (HECTOROL) injection 2 mcg  2 mcg Intravenous Q M,W,F-HD Zeyfang, David, PA-C      . famotidine (PEPCID) tablet 20 mg  20 mg Oral BID Karmen Bongo, MD   20 mg at 04/14/18 2204  . feeding supplement (NEPRO CARB STEADY) liquid 237 mL  237 mL Oral TID PRN Karmen Bongo, MD      . heparin ADULT infusion 100 units/mL (25000 units/26mL sodium chloride 0.45%)  1,600 Units/hr Intravenous Continuous Dang, Thuy D, RPH 16 mL/hr at 04/15/18 0700 1,600 Units/hr at 04/15/18 0700  . insulin aspart (novoLOG) injection 0-15 Units  0-15 Units Subcutaneous TID WC Karmen Bongo, MD   2 Units at 04/14/18 1331  . insulin aspart (novoLOG) injection 0-5 Units  0-5 Units Subcutaneous QHS Karmen Bongo, MD      . insulin detemir (LEVEMIR) injection 48 Units  48 Units Subcutaneous QHS Karmen Bongo, MD   48 Units at 04/14/18 2249  . metoprolol tartrate (LOPRESSOR) tablet 25 mg  25 mg Oral BID Karmen Bongo, MD   25 mg at 04/14/18 2203  . nitroGLYCERIN 50 mg in dextrose 5 % 250 mL (0.2 mg/mL) infusion  0-200 mcg/min Intravenous Continuous Karmen Bongo, MD 3 mL/hr at 04/15/18 0700 10 mcg/min at 04/15/18 0700  . ondansetron (ZOFRAN) injection 4 mg  4 mg Intravenous Q6H PRN Shelva Majestic A, MD      . sodium chloride flush (NS) 0.9 % injection 3 mL  3 mL Intravenous Q12H Karmen Bongo, MD   3 mL at 04/14/18 2209  . sodium chloride flush (NS) 0.9 % injection 3 mL  3 mL Intravenous PRN Karmen Bongo, MD      . sodium chloride flush (NS) 0.9 % injection 3 mL  3 mL Intravenous Q12H [name], MD  3 mL at 04/14/18 2205  . sodium chloride flush (NS) 0.9 % injection 3 mL  3 mL Intravenous PRN [name], MD      . sorbitol 70 % solution 30 mL  30 mL Oral PRN Karmen Bongo, MD      .  triamcinolone 0.1 % cream : eucerin cream, 1:1   Topical TID Karmen Bongo, MD      . zolpidem Lorrin Mais) tablet 5 mg  5 mg Oral QHS PRN Karmen Bongo, MD        Medications Prior to Admission  Medication Sig Dispense Refill Last Dose  . aspirin EC 81 MG tablet Take 1 tablet (81 mg total) by mouth daily. 90 tablet 3 04/13/2018 at Unknown time  . atorvastatin (LIPITOR) 40 MG tablet Take 2 tablets (80 mg total) by mouth daily. 1 tablet 0 04/13/2018 at Unknown time  . cinacalcet (SENSIPAR) 30 MG tablet Take 30 mg by mouth daily with supper.   04/13/2018 at Unknown time  . insulin detemir (LEVEMIR) 100 UNIT/ML injection Inject 0.48 mLs (48 Units total) into the skin at bedtime. 10 mL 0 04/13/2018 at Unknown time  . lidocaine-prilocaine (EMLA) cream Apply 1 application topically once as needed (prior to accessing port).    Past Month at Unknown time  . metoprolol tartrate (LOPRESSOR) 25 MG tablet Take 1 tablet (25 mg total) by mouth 2 (two) times daily. 60 tablet 0 04/13/2018 at 1800  . nitroGLYCERIN (NITROSTAT) 0.4 MG SL tablet Place 0.4 mg under the tongue every 5 (five) minutes as needed for chest pain.    04/14/2018 at Unknown time  . ranitidine (ZANTAC) 150 MG tablet Take 150 mg by mouth 2 (two) times daily as needed for heartburn.   04/13/2018 at Unknown time    Family History  Problem Relation Age of Onset  . Diabetes Father   . Heart disease Father   . Diabetes Sister   . Alzheimer's disease Mother    [name], MD (Primary)    Procedures   LEFT HEART CATH AND CORONARY ANGIOGRAPHY  Conclusion     Ost Cx lesion is 95% stenosed.  Prox LAD-1 lesion is 60% stenosed.  Prox LAD-2 lesion is 70% stenosed.  Prox RCA to Dist RCA lesion is 100% stenosed.  Prox Cx to Mid Cx lesion is 30% stenosed.  Ost 2nd Diag lesion is 70% stenosed.   Significant multivessel coronary calcification with 3 vessel CAD, and 60% proximal LAD stenosis, 70% calcified stenosis between the first and  second diagonal vessel; dominant left circumflex coronary artery with new 90-95% ostial stenosis and diffuse 30% mid stenosis; total chronic mid RCA occlusion.  There is left to right collateralization to the distal RCA.  Well-seated TAVR valve.  RECOMMENDATION: With the patient's significant coronary calcification, and significant multivessel CAD with high-grade ostial circumflex stenosis in a large dominant circumflex vessel, calcified LAD disease and left-to-right collateralization to the distal RCA, will ask Dr. Ricard Dillon to reevaluate for consideration of possible CABG revascularization surgery in this patient with a history of prior TAVR.   Indications   Coronary artery disease involving native coronary artery of native heart with unstable angina pectoris ([place]) [I25.110 (ICD-10-CM)]  Procedural Details/Technique   Technical Details Mr Zamauri Nez is a 77 year old male who underwent TAVR surgery by Dr. Ricard Dillon and Burt Knack. He has end-stage renal disease and is on dialysis. He has known chronic total mid RCA occlusion. At last catheterization he had calcified LAD stenoses and moderate circumflex disease.  He has developed increasing chest pain and was admitted with unstable angina. Initial troponin is mildly positive. He is referred for cardiac catheterization.  Patient presented to the catheterization laboratory in the fasting state. His right femoral artery was puncture anteriorly and a 5 French sheath was inserted without difficulty. Diagnostic catheterization was performed with 5 French Judkins 4 left and right diagnostic catheters. The TAVR valve was not crossed. The patient tolerated the procedure well. Hemostasis was obtained by direct manual pressure.   Estimated blood loss <50 mL.  During this procedure the patient was administered the following to achieve and maintain moderate conscious sedation: Versed 2 mg, Fentanyl 25 mcg, while the patient's heart rate, blood pressure, and oxygen  saturation were continuously monitored. The period of conscious sedation was 24 minutes, of which I was present face-to-face 100% of this time.  Coronary Findings   Diagnostic  Dominance: Left  Left Anterior Descending  Prox LAD-1 lesion 60% stenosed  Prox LAD-1 lesion is 60% stenosed.  Prox LAD-2 lesion 70% stenosed  Prox LAD-2 lesion is 70% stenosed.  First Diagonal Branch  Second Diagonal [place] 2nd Diag lesion 70% stenosed  Ost 2nd Diag lesion is 70% stenosed.  Left Circumflex  Ost Cx lesion 95% stenosed  Ost Cx lesion is 95% stenosed.  Prox Cx to Mid Cx lesion 30% stenosed  Prox Cx to Mid Cx lesion is 30% stenosed.  Right Coronary Artery  Collaterals  Dist RCA filled by collaterals from 2nd Sept.    Collaterals  Dist RCA filled by collaterals from Dist Cx.    Prox RCA to Dist RCA lesion 100% stenosed  Prox RCA to Dist RCA lesion is 100% stenosed.  Intervention   No interventions have been documented.  Coronary Diagrams   Diagnostic Diagram       Implants    No implant documentation for this case.  MERGE Images   Show images for CARDIAC CATHETERIZATION   Link to Procedure Log   Procedure Log    Hemo Data    Most Recent Value  AO Systolic Pressure 701 mmHg  AO Diastolic Pressure 55 mmHg  AO Mean 87 mmHg  Order-Level Documents - 04/14/2018:   Scan on 04/14/2018 11:46 AM by Default, Provider, MD      Encounter-Level Documents - 04/14/2018:   Scan on 04/15/2018 9:04 AM by Default, Provider, MD  Scan on 04/14/2018 4:02 PM by Default, Provider, MD  Scan on 04/14/2018 2:34 PM by Default, Provider, MD  Document on 04/14/2018 4:27 AM by Orpah Greek, MD: ED PB Billing Extract  Electronic signature on 04/14/2018 2:07 AM - Signed  Electronic signature on 04/14/2018 2:07 AM - Signed      Signed   Electronically signed by [name], MD on 04/14/18 at 2100 EDT  External Result Report   External Result Report   Review of Systems    Constitutional: Positive for malaise/fatigue. Negative for chills, diaphoresis, fever and weight loss.  HENT: Negative for congestion, ear discharge, ear pain, hearing loss, nosebleeds, sinus pain, sore throat and tinnitus.   Eyes: Positive for blurred vision. Negative for double vision, photophobia, pain, discharge and redness.  Respiratory: Positive for cough, sputum production and shortness of breath. Negative for hemoptysis and stridor.   Cardiovascular: Positive for chest pain and palpitations. Negative for orthopnea, claudication, leg swelling and PND.  Gastrointestinal: Positive for diarrhea. Negative for abdominal pain, blood in stool, constipation, heartburn, nausea and vomiting.  Genitourinary: Negative for dysuria, flank pain,  frequency, hematuria and urgency.       Does make urine  Musculoskeletal: Positive for back pain, joint pain and myalgias. Negative for falls and neck pain.       Uses cane, + right knee pain  Skin: Positive for itching and rash.  Neurological: Positive for tingling and sensory change. Negative for dizziness, tremors, speech change, focal weakness, seizures, loss of consciousness, weakness and headaches.  Endo/Heme/Allergies: Negative for environmental allergies and polydipsia. Bruises/bleeds easily.  Psychiatric/Behavioral: Negative.      Physical Exam: BP (!) 99/48   Pulse 70   Temp (!) 97.5 F (36.4 C) (Oral)   Resp 18   Ht 5\' 11"  (1.803 m)   Wt 109.5 kg (241 lb 6.5 oz)   SpO2 96%   BMI 33.67 kg/m   Physical Exam  Constitutional: He is oriented to person, place, and time. He appears well-developed and well-nourished. No distress.  HENT:  Head: Normocephalic and atraumatic.  Mouth/Throat: Oropharynx is clear and moist. No oropharyngeal exudate.  edentulous  Eyes: Pupils are equal, round, and reactive to light. Conjunctivae and EOM are normal. Right eye exhibits no discharge. Left eye exhibits no discharge. No scleral icterus.  Neck: No JVD  present. No tracheal deviation present. No thyromegaly present.  Cardiovascular: Normal rate and regular rhythm. Exam reveals no gallop and no friction rub.  No murmur heard. Pedal and radial pulses absent  Pulmonary/Chest: Effort normal. No stridor. No respiratory distress. He has wheezes. He has no rales. He exhibits no tenderness.  Abdominal: Soft. Bowel sounds are normal. He exhibits no distension and no mass. There is no tenderness. There is no rebound and no guarding.  Musculoskeletal: Normal range of motion. He exhibits no edema, tenderness or deformity.  Lymphadenopathy:    He has no cervical adenopathy.  Neurological: He is alert and oriented to person, place, and time. No cranial nerve deficit. He exhibits normal muscle tone.  Gait not tested, in bed during dialysis. Neuro exam is grossly normal   Skin: Skin is warm and dry. Rash noted. He is not diaphoretic. No erythema. No pallor.  hemorhagic scratches on legs, arm and back  Psychiatric: He has a normal mood and affect. His behavior is normal. Judgment and thought content normal.     Diagnostic Studies & Laboratory data:     Recent Radiology Findings:   Dg Chest 2 View  Result Date: 04/14/2018 CLINICAL DATA:  Chest pain EXAM: CHEST - 2 VIEW COMPARISON:  11/14/2017 FINDINGS: Right-sided pacing device as before, incompletely visualized ventricular leads. Cardiac valve. Incomplete inclusion of the lung bases. Hyperinflation. No pleural effusion. Cardiomegaly. Aortic atherosclerosis. No pneumothorax. IMPRESSION: No active cardiopulmonary disease.  Cardiomegaly Electronically Signed   By: Donavan Foil M.D.   On: 04/14/2018 03:17     I have independently reviewed the above radiologic studies and discussed with the patient   Recent Lab Findings: Lab Results  Component Value Date   WBC 10.2 04/15/2018   HGB 10.3 (L) 04/15/2018   HCT 31.1 (L) 04/15/2018   PLT 175 04/15/2018   GLUCOSE 162 (H) 04/15/2018   CHOL 80 04/15/2018    TRIG 179 (H) 04/15/2018   HDL 22 (L) 04/15/2018   LDLCALC 22 04/15/2018   ALT 8 (L) 06/17/2017   AST 14 (L) 06/17/2017   NA 136 04/15/2018   K 4.7 04/15/2018   CL 98 (L) 04/15/2018   CREATININE 7.11 (H) 04/15/2018   BUN 68 (H) 04/15/2018   CO2 23 04/15/2018  TSH 3.286 04/14/2018   INR 2.5 12/19/2017   HGBA1C 7.4 (H) 04/14/2018      Assessment / Plan: The patient is a 77 year old male with severe three-vessel coronary artery disease and recent non-STEMI.  He has multiple significant medical co morbidities that would make CABG risk stratification quite elevated.     John Giovanni, PA-C 04/15/2018 11:50 AM    STS Risk Calculator  Procedure: Isolated CAB CALCULATE  Risk of Mortality:  17.219%   Renal Failure:  NA  Permanent Stroke:  3.599%   Prolonged Ventilation:  47.964%   DSW Infection:  1.071%   Reoperation:  3.860%   Morbidity or Mortality:  49.312%   Short Length of Stay:  2.350%   Long Length of Stay:  62.371%    I have seen and examined the patient, reviewed his past medical history and diagnostic cardiac catheterization and agree with the assessment as outlined by Jadene Pierini.  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  Previous catheterizations have documented  long-standing 100% chronic occlusion of the right coronary artery.  The patient presented with angina pectoris in June 2018 and catheterization revealed long segment diffuse 65% stenosis of the proximal left anterior descending coronary artery with 40% stenosis of the left circumflex coronary artery.  There was mild pulmonary hypertension at that time and the transcatheter aortic valve was functioning normally.  Medical therapy was recommended.  The patient is continued to have episodes of angina intermittently ever since.  He has been seen in the emergency department several times in the last several months with chest pain and sent home after cardiac enzymes were negative.  He experienced a more severe episode of chest pain yesterday morning which prompted his current admission.  Symptoms had resolved by the time patient was admitted to the hospital but cardiac enzymes were weakly positive.  Diagnostic cardiac catheterization performed by Dr. Claiborne Billings reveals further progression in the severity of stenosis in the left anterior descending coronary artery and high-grade proximal stenosis of the left circumflex coronary artery.  Right heart catheterization was not done.  Echocardiogram was ordered but has not been done.  The patient has not had any further episodes of chest pain since admission.  I have discussed the results of the patient's diagnostic cardiac catheterization, the nature of his progressive ischemic heart disease, the complex nature of his comorbid medical problems, and his long-term prognosis at length with the patient and his family this afternoon.  We discussed treatment options including long-term medical therapy without any type of revascularization, high risk PCI and stenting, and extreme risk surgical revascularization.  We discussed both short-term risks, likelihood of recovery and quality of life, and long-term prognosis with each treatment option and all of his questions were answered.  We  await results of the patient's echocardiogram to assess left ventricular systolic function, signs of pulmonary hypertension, and right ventricular function to further characterize the patient's short-term risk and long-term prognosis.  The  patient is interested in discussing whether or not PCI and stenting might be a viable option.  I would be very reluctant to consider this patient a candidate for coronary artery bypass grafting.   I spent in excess of 90 minutes during the conduct of this initial hospital encounter and >50% of this time involved direct face-to-face encounter with the patient for counseling and/or coordination of their care.          Rexene Alberts, MD 04/15/2018 6:01 PM

## 2018-04-15 NOTE — Progress Notes (Addendum)
Progress Note  Patient Name: Jeffrey [name] Date of Encounter: 04/15/2018  Primary Cardiologist: [name], MD   Subjective   Pt hypoxic this AM requiring supplemental O2. Pt states he feels like he has PNA. Will order CXR. Heparin drip and nitro drip still running. No chest pain.   Inpatient Medications    Scheduled Meds: . aspirin EC  81 mg Oral Daily  . atorvastatin  80 mg Oral q1800  . cinacalcet  30 mg Oral Q supper  . [START ON 04/16/2018] darbepoetin (ARANESP) injection - DIALYSIS  60 mcg Intravenous Q Wed-HD  . [START ON 04/16/2018] doxercalciferol  2 mcg Intravenous Q M,W,F-HD  . famotidine  20 mg Oral BID  . insulin aspart  0-15 Units Subcutaneous TID WC  . insulin aspart  0-5 Units Subcutaneous QHS  . insulin detemir  48 Units Subcutaneous QHS  . metoprolol tartrate  25 mg Oral BID  . sodium chloride flush  3 mL Intravenous Q12H  . sodium chloride flush  3 mL Intravenous Q12H  . triamcinolone 0.1 % cream : eucerin   Topical TID   Continuous Infusions: . sodium chloride    . sodium chloride    . heparin 1,600 Units/hr (04/15/18 0700)  . nitroGLYCERIN 10 mcg/min (04/15/18 0700)   PRN Meds: sodium chloride, sodium chloride, acetaminophen, calcium carbonate (dosed in mg elemental calcium), camphor-menthol **AND** hydrOXYzine, docusate sodium, feeding supplement (NEPRO CARB STEADY), ondansetron (ZOFRAN) IV, sodium chloride flush, sodium chloride flush, sorbitol, zolpidem   Vital Signs    Vitals:   04/15/18 0815 04/15/18 0845 04/15/18 0915 04/15/18 0930  BP: (!) 115/36 (!) 116/49 (!) 110/53 (!) 124/55  Pulse: 70 66 69 69  Resp:      Temp:      TempSrc:      SpO2:      Weight:      Height:        Intake/Output Summary (Last 24 hours) at 04/15/2018 0959 Last data filed at 04/15/2018 0700 Gross per 24 hour  Intake 369.56 ml  Output 600 ml  Net -230.44 ml   Filed Weights   04/14/18 0337 04/15/18 0800  Weight: 241 lb (109.3 kg) 241 lb 6.5 oz (109.5 kg)      Telemetry    Paced rhythm - Personally Reviewed  ECG    No new tracings - Personally Reviewed  Physical Exam   GEN: No acute distress.   Neck: No JVD Cardiac: RRR, no murmurs, rubs, or gallops.  Respiratory: Clear to auscultation bilaterally. GI: Soft, nontender, non-distended  MS: No edema; No deformity. Neuro:  Nonfocal  Psych: Normal affect   Labs    Chemistry Recent Labs  Lab 04/14/18 0230 04/15/18 0311  NA 134* 136  K 4.5 4.7  CL 97* 98*  CO2 22 23  GLUCOSE 233* 162*  BUN 61* 68*  CREATININE 6.59* 7.11*  CALCIUM 8.8* 8.5*  GFRNONAA 7* 6*  GFRAA 8* 8*  ANIONGAP 15 15     Hematology Recent Labs  Lab 04/14/18 0230 04/15/18 0311  WBC 7.7 10.2  RBC 3.28* 3.33*  HGB 10.0* 10.3*  HCT 31.0* 31.1*  MCV 94.5 93.4  MCH 30.5 30.9  MCHC 32.3 33.1  RDW 14.0 13.9  PLT 174 175    Cardiac Enzymes Recent Labs  Lab 04/14/18 1003 04/14/18 1714 04/14/18 2346  TROPONINI 0.36* 0.53* 0.47*    Recent Labs  Lab 04/14/18 0239  TROPIPOC 0.14*     BNPNo results for input(s): BNP,  PROBNP in the last 168 hours.   DDimer No results for input(s): DDIMER in the last 168 hours.   Radiology    Dg Chest 2 View  Result Date: 04/14/2018 CLINICAL DATA:  Chest pain EXAM: CHEST - 2 VIEW COMPARISON:  11/14/2017 FINDINGS: Right-sided pacing device as before, incompletely visualized ventricular leads. Cardiac valve. Incomplete inclusion of the lung bases. Hyperinflation. No pleural effusion. Cardiomegaly. Aortic atherosclerosis. No pneumothorax. IMPRESSION: No active cardiopulmonary disease.  Cardiomegaly Electronically Signed   By: Donavan Foil M.D.   On: 04/14/2018 03:17    Cardiac Studies   Left heart cath 04/14/18:  Colon Flattery Cx lesion is 95% stenosed.  Prox LAD-1 lesion is 60% stenosed.  Prox LAD-2 lesion is 70% stenosed.  Prox RCA to Dist RCA lesion is 100% stenosed.  Prox Cx to Mid Cx lesion is 30% stenosed.  Ost 2nd Diag lesion is 70% stenosed.    Significant multivessel coronary calcification with 3 vessel CAD, and 60% proximal LAD stenosis, 70% calcified stenosis between the first and second diagonal vessel; dominant left circumflex coronary artery with new 90-95% ostial stenosis and diffuse 30% mid stenosis; total chronic mid RCA occlusion.  There is left to right collateralization to the distal RCA.  Well-seated TAVR valve.  RECOMMENDATION: With the patient's significant coronary calcification, and significant multivessel CAD with high-grade ostial circumflex stenosis in a large dominant circumflex vessel, calcified LAD disease and left-to-right collateralization to the distal RCA, will ask Dr. Ricard Dillon to reevaluate for consideration of possible CABG revascularization surgery in this patient with a history of prior TAVR.  Patient Profile     77 y.o. 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   Assessment & Plan    1. NSTEMI, CAD, heart cath 04/14/18 with significant 3v disease including total chronic occlusion of the mid RCA with left-to-right collaterals - no intervention during heart cath - consulted TCTS for possible revascularization with CABG, pending - heparin drip running  - may try him off of nitro to evaluate chest pain after TCTS evaluates - echo pending   2. S/p TAVR - stable on heart cath yesterday   3. ESRD on HD - currently in HD   4. Chronic systolic and diastolic heart failure - echo 2018 with LVEF of 40% - volume management by HD and nephrology   5. Hypoxia this morning with cough - ordered CXR - if PNA, ABX per primary   For questions or updates, please contact [facility] Please consult  [email or web address] for contact info under Cardiology/STEMI.      Signed, Ledora Bottcher, PA  04/15/2018, 9:59 AM     Agree with note by Fabian Sharp PA-C  Admitted with NSTEMI. Cath yesterday showed severe 3VD. S/P TAVR. 2D pending. On IV Hep/NTG. TCTS consult pending. No further CP. Exam benign.  Lorretta Harp, M.D., [place], [facility], [place] [address] [place],   [id number]  [phone] 04/15/2018 11:32 AM

## 2018-04-15 NOTE — Progress Notes (Signed)
ANTICOAGULATION CONSULT NOTE  Pharmacy Consult:  Heparin Indication: chest pain/ACS  Allergies  Allergen Reactions  . Byetta 10 Mcg Pen [Exenatide] Diarrhea and Nausea And Vomiting  . Codeine Itching  . Coumadin [Warfarin Sodium] Rash    Patient Measurements: Height: 5\' 11"  (180.3 cm) Weight: 241 lb 6.5 oz (109.5 kg) IBW/kg (Calculated) : 75.3 Heparin Dosing Weight: 99  kg  Vital Signs: Temp: 97.5 F (36.4 C) (05/21 0800) Temp Source: Oral (05/21 0800) BP: 102/38 (05/21 1200) Pulse Rate: 79 (05/21 1200)  Labs: Recent Labs    04/14/18 0230 04/14/18 1000 04/14/18 1003 04/14/18 1714 04/14/18 2346 04/15/18 0311 04/15/18 1348  HGB 10.0*  --   --   --   --  10.3*  --   HCT 31.0*  --   --   --   --  31.1*  --   PLT 174  --   --   --   --  175  --   HEPARINUNFRC  --  0.17*  --   --   --   --  0.41  CREATININE 6.59*  --   --   --   --  7.11*  --   TROPONINI  --   --  0.36* 0.53* 0.47*  --   --     Estimated Creatinine Clearance: 10.6 mL/min (A) (by C-G formula based on SCr of 7.11 mg/dL (H)).  Assessment: 56 YOM s/p cath to resume IV heparin 10 hours post sheath removal after cath yesterday. Cath 5/20 showed significant multivessel CAD - being evaluated by CTVS.  Hgb 10.3, plt 175. No s/sx of bleeding. Underwent HD today so heparin level delayed. Heparin level this afternoon came back therapeutic at 0.41, on 1600 units/hr.   Goal of Therapy:  Heparin level 0.3-0.7 units/ml Monitor platelets by anticoagulation protocol: Yes   Plan:  Continue heparin infusion at 1600 units/hr Daily heparin level and CBC Monitor for s/sx of bleeding / hematoma Monitor plans per CTVS  Doylene Canard, PharmD Clinical Pharmacist  Pager: [phone] Phone: [phone]

## 2018-04-15 NOTE — Progress Notes (Signed)
[facility] NEPHROLOGY PROGRESS NOTE  Assessment/ Plan: Pt is a 77 y.o. yo male  with ESRD Chronic HD on  MWF ( [facility] and compliant ) admitted with non-STEMI.  Status post cardiac catheter with three-vessel disease. Dialysis Orders: Center: [facility]   on mwf . EDW 106kg HD Bath 2k, 3.5 ca  Time 4hr Heparin NONE. Access LUA AVF  Hec 2 mcg IV/HD Mircera 60 mcg  q 2wks ( last on 04/02/18)  Assessment/Plan:  #Chest pain/NSTEMI: Cath showed three-vessel disease, consulted CT surgery.  No chest pain today.  Per cardiology. # ESRD:MWF, receiving dialysis today because of busy schedule yesterday.  Tolerating well.  2K bath with UF goal of 2.5 kg as tolerated. # Anemia: Hemoglobin 10.3.  Continue Aranesp. # Secondary hyperparathyroidism: Continue Sensipar, Hectorol # HTN/volume: Blood pressure acceptable.  Continue to monitor.  Subjective: Seen and examined during dialysis.  Denies chest pain, shortness of breath, nausea vomiting.  Tolerating dialysis well. Objective Vital signs in last 24 hours: Vitals:   04/15/18 0800 04/15/18 0810 04/15/18 0815 04/15/18 0845  BP: (!) 120/57 (!) 111/53 (!) 115/36 (!) 116/49  Pulse: 66 73 70 66  Resp: 18     Temp: (!) 97.5 F (36.4 C)     TempSrc: Oral     SpO2: 96%     Weight: 109.5 kg (241 lb 6.5 oz)     Height:       Weight change:   Intake/Output Summary (Last 24 hours) at 04/15/2018 0852 Last data filed at 04/15/2018 0700 Gross per 24 hour  Intake 369.56 ml  Output 600 ml  Net -230.44 ml       Labs: Basic Metabolic Panel: Recent Labs  Lab 04/14/18 0230 04/15/18 0311  NA 134* 136  K 4.5 4.7  CL 97* 98*  CO2 22 23  GLUCOSE 233* 162*  BUN 61* 68*  CREATININE 6.59* 7.11*  CALCIUM 8.8* 8.5*   Liver Function Tests: No results for input(s): AST, ALT, ALKPHOS, BILITOT, PROT, ALBUMIN in the last 168 hours. No results for input(s): LIPASE, AMYLASE in the last 168 hours. No results for input(s): AMMONIA in the last 168  hours. CBC: Recent Labs  Lab 04/14/18 0230 04/15/18 0311  WBC 7.7 10.2  HGB 10.0* 10.3*  HCT 31.0* 31.1*  MCV 94.5 93.4  PLT 174 175   Cardiac Enzymes: Recent Labs  Lab 04/14/18 1003 04/14/18 1714 04/14/18 2346  TROPONINI 0.36* 0.53* 0.47*   CBG: Recent Labs  Lab 04/14/18 1323 04/14/18 1616 04/14/18 1743 04/14/18 2137  GLUCAP 125* 124* 112* 180*    Iron Studies: No results for input(s): IRON, TIBC, TRANSFERRIN, FERRITIN in the last 72 hours. Studies/Results: Dg Chest 2 View  Result Date: 04/14/2018 CLINICAL DATA:  Chest pain EXAM: CHEST - 2 VIEW COMPARISON:  11/14/2017 FINDINGS: Right-sided pacing device as before, incompletely visualized ventricular leads. Cardiac valve. Incomplete inclusion of the lung bases. Hyperinflation. No pleural effusion. Cardiomegaly. Aortic atherosclerosis. No pneumothorax. IMPRESSION: No active cardiopulmonary disease.  Cardiomegaly Electronically Signed   By: Donavan Foil M.D.   On: 04/14/2018 03:17    Medications: Infusions: . sodium chloride    . sodium chloride    . heparin 1,600 Units/hr (04/15/18 0700)  . nitroGLYCERIN 10 mcg/min (04/15/18 0700)    Scheduled Medications: . aspirin EC  81 mg Oral Daily  . atorvastatin  80 mg Oral q1800  . cinacalcet  30 mg Oral Q supper  . [START ON 04/16/2018] darbepoetin (ARANESP) injection - DIALYSIS  60 mcg Intravenous Q Wed-HD  . [START ON 04/16/2018] doxercalciferol  2 mcg Intravenous Q M,W,F-HD  . famotidine  20 mg Oral BID  . insulin aspart  0-15 Units Subcutaneous TID WC  . insulin aspart  0-5 Units Subcutaneous QHS  . insulin detemir  48 Units Subcutaneous QHS  . metoprolol tartrate  25 mg Oral BID  . sodium chloride flush  3 mL Intravenous Q12H  . sodium chloride flush  3 mL Intravenous Q12H  . triamcinolone 0.1 % cream : eucerin   Topical TID    have reviewed scheduled and prn medications.  Physical Exam: General:NAD, comfortable Heart:RRR, s1s2 nl Lungs:clear b/l, no  cracjle Abdomen:soft, Non-tender, non-distended Extremities:No edema Dialysis Access: Left AV fistula  Dron Prasad Bhandari 04/15/2018,8:52 AM  LOS: 0 days

## 2018-04-15 NOTE — Plan of Care (Signed)
Continue with current plan of care.

## 2018-04-15 NOTE — Progress Notes (Addendum)
Pt found with low o2 sats suring sleep. Pt states he has been told to wear a CPAP before, but he can not tolerate wearing it. Pt placed on 2L [place] at this time. Will continue to monitor.    04/15/18 0722  Vitals  Pulse Rate 60  ECG Heart Rate 62  Resp (!) 23  Oxygen Therapy  SpO2 (!) 76 %

## 2018-04-15 NOTE — Progress Notes (Signed)
   04/15/18 0728  Vitals  Pulse Rate 71  ECG Heart Rate 71  Resp 17  Oxygen Therapy  SpO2 (!) 84 %    O2 increased to 4L [place]. Will continue to monitor.

## 2018-04-15 NOTE — Progress Notes (Signed)
ANTICOAGULATION CONSULT NOTE  Pharmacy Consult:  Heparin Indication: chest pain/ACS  Addendum:   Nurse called and reports profuse bleeding. Instructed her to reduce rate to 1500 units/hr. She is getting further clarification from MD.   Will continue to follow  Erin Hearing PharmD., BCPS Clinical Pharmacist 04/15/2018 3:29 PM

## 2018-04-16 ENCOUNTER — Other Ambulatory Visit (HOSPITAL_COMMUNITY): Payer: Medicare Other

## 2018-04-16 DIAGNOSIS — I482 Chronic atrial fibrillation: Secondary | ICD-10-CM

## 2018-04-16 DIAGNOSIS — Z992 Dependence on renal dialysis: Secondary | ICD-10-CM

## 2018-04-16 DIAGNOSIS — I249 Acute ischemic heart disease, unspecified: Secondary | ICD-10-CM

## 2018-04-16 DIAGNOSIS — N186 End stage renal disease: Secondary | ICD-10-CM

## 2018-04-16 DIAGNOSIS — I10 Essential (primary) hypertension: Secondary | ICD-10-CM

## 2018-04-16 LAB — HEPARIN LEVEL (UNFRACTIONATED)
Heparin Unfractionated: 0.19 IU/mL — ABNORMAL LOW (ref 0.30–0.70)
Heparin Unfractionated: 0.21 IU/mL — ABNORMAL LOW (ref 0.30–0.70)

## 2018-04-16 LAB — GLUCOSE, CAPILLARY
GLUCOSE-CAPILLARY: 148 mg/dL — AB (ref 65–99)
GLUCOSE-CAPILLARY: 275 mg/dL — AB (ref 65–99)
Glucose-Capillary: 200 mg/dL — ABNORMAL HIGH (ref 65–99)
Glucose-Capillary: 185 mg/dL — ABNORMAL HIGH (ref 65–99)

## 2018-04-16 LAB — CBC
HEMATOCRIT: 28.6 % — AB (ref 39.0–52.0)
Hemoglobin: 9.5 g/dL — ABNORMAL LOW (ref 13.0–17.0)
MCH: 31.5 pg (ref 26.0–34.0)
MCHC: 33.2 g/dL (ref 30.0–36.0)
MCV: 94.7 fL (ref 78.0–100.0)
Platelets: 162 10*3/uL (ref 150–400)
RBC: 3.02 MIL/uL — ABNORMAL LOW (ref 4.22–5.81)
RDW: 14.2 % (ref 11.5–15.5)
WBC: 7.3 10*3/uL (ref 4.0–10.5)

## 2018-04-16 NOTE — Progress Notes (Signed)
Patient having consistently low Bps. MD notified. Nitro drip off per MD orders. Will continue to monitor.

## 2018-04-16 NOTE — Progress Notes (Signed)
Courtland Free, RN called and informed pt's HD tx has been moved to 04/17/18

## 2018-04-16 NOTE — Progress Notes (Addendum)
ANTICOAGULATION CONSULT NOTE  Pharmacy Consult:  Heparin Indication: chest pain/ACS  Allergies  Allergen Reactions  . Byetta 10 Mcg Pen [Exenatide] Diarrhea and Nausea And Vomiting  . Codeine Itching  . Coumadin [Warfarin Sodium] Rash    Patient Measurements: Height: 5\' 11"  (180.3 cm) Weight: 233 lb 4 oz (105.8 kg) IBW/kg (Calculated) : 75.3 Heparin Dosing Weight: 99  kg  Vital Signs: Temp: 98.1 F (36.7 C) (05/22 1603) Temp Source: Oral (05/22 1603) BP: 119/40 (05/22 1603) Pulse Rate: 86 (05/22 1603)  Labs: Recent Labs    04/14/18 0230  04/14/18 1003 04/14/18 1714 04/14/18 2346 04/15/18 0311 04/15/18 1348 04/16/18 0212 04/16/18 1642  HGB 10.0*  --   --   --   --  10.3*  --  9.5*  --   HCT 31.0*  --   --   --   --  31.1*  --  28.6*  --   PLT 174  --   --   --   --  175  --  162  --   HEPARINUNFRC  --    < >  --   --   --   --  0.41 0.19* 0.21*  CREATININE 6.59*  --   --   --   --  7.11*  --   --   --   TROPONINI  --   --  0.36* 0.53* 0.47*  --   --   --   --    < > = values in this interval not displayed.    Estimated Creatinine Clearance: 10.4 mL/min (A) (by C-G formula based on SCr of 7.11 mg/dL (H)).  Assessment: 55 YOM started on heparin for ACS. 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 -heparin level is 0.21 after increase to 1550 units/hr   Goal of Therapy:  Heparin level 0.3-0.5 units/ml - given bleeding Monitor platelets by anticoagulation protocol: Yes   Plan:  Increase heparin infusion at 1650 units/hr Daily heparin level and CBC  Hildred Laser, PharmD Clinical Pharmacist 04/16/2018 6:07 PM

## 2018-04-16 NOTE — Progress Notes (Signed)
Progress Note  Patient Name: Jeffrey [name] Date of Encounter: 04/16/2018  Primary Cardiologist: [name], MD   Subjective   Pt hypoxic this AM requiring supplemental O2. Heparin drip and nitro drip still running. No chest pain.  No change in clinical status.  Had hemodialysis yesterday.  Inpatient Medications    Scheduled Meds: . aspirin EC  81 mg Oral Daily  . atorvastatin  80 mg Oral q1800  . cinacalcet  30 mg Oral Q supper  . darbepoetin (ARANESP) injection - DIALYSIS  60 mcg Intravenous Q Wed-HD  . doxercalciferol  2 mcg Intravenous Q M,W,F-HD  . famotidine  20 mg Oral Daily  . insulin aspart  0-15 Units Subcutaneous TID WC  . insulin aspart  0-5 Units Subcutaneous QHS  . insulin detemir  48 Units Subcutaneous QHS  . metoprolol tartrate  25 mg Oral BID  . sodium chloride flush  3 mL Intravenous Q12H  . sodium chloride flush  3 mL Intravenous Q12H  . triamcinolone 0.1 % cream : eucerin   Topical TID   Continuous Infusions: . sodium chloride    . sodium chloride    . heparin 1,500 Units/hr (04/16/18 0300)  . nitroGLYCERIN 0 mcg/min (04/16/18 0219)   PRN Meds: sodium chloride, sodium chloride, acetaminophen, calcium carbonate (dosed in mg elemental calcium), camphor-menthol **AND** hydrOXYzine, docusate sodium, feeding supplement (NEPRO CARB STEADY), ondansetron (ZOFRAN) IV, sodium chloride flush, sodium chloride flush, sorbitol, zolpidem   Vital Signs    Vitals:   04/15/18 2308 04/16/18 0300 04/16/18 0348 04/16/18 0800  BP: (!) 112/57 (!) 96/40  (!) 103/42  Pulse: 72 68  66  Resp: 13 20  18   Temp: 98.4 F (36.9 C)  98.6 F (37 C) 97.8 F (36.6 C)  TempSrc: Oral  Oral Oral  SpO2: 99% 96%  97%  Weight:      Height:        Intake/Output Summary (Last 24 hours) at 04/16/2018 0958 Last data filed at 04/16/2018 0300 Gross per 24 hour  Intake 1076.58 ml  Output 3667 ml  Net -2590.42 ml   Filed Weights   04/14/18 0337 04/15/18 0800 04/15/18 1210    Weight: 241 lb (109.3 kg) 241 lb 6.5 oz (109.5 kg) 233 lb 4 oz (105.8 kg)    Telemetry    Paced rhythm - Personally Reviewed  ECG    No new tracings - Personally Reviewed  Physical Exam   GEN: No acute distress.   Neck: No JVD Cardiac: RRR, no murmurs, rubs, or gallops.  Respiratory: Clear to auscultation bilaterally. GI: Soft, nontender, non-distended  MS: No edema; No deformity. Neuro:  Nonfocal  Psych: Normal affect   Labs    Chemistry Recent Labs  Lab 04/14/18 0230 04/15/18 0311  NA 134* 136  K 4.5 4.7  CL 97* 98*  CO2 22 23  GLUCOSE 233* 162*  BUN 61* 68*  CREATININE 6.59* 7.11*  CALCIUM 8.8* 8.5*  GFRNONAA 7* 6*  GFRAA 8* 8*  ANIONGAP 15 15     Hematology Recent Labs  Lab 04/14/18 0230 04/15/18 0311 04/16/18 0212  WBC 7.7 10.2 7.3  RBC 3.28* 3.33* 3.02*  HGB 10.0* 10.3* 9.5*  HCT 31.0* 31.1* 28.6*  MCV 94.5 93.4 94.7  MCH 30.5 30.9 31.5  MCHC 32.3 33.1 33.2  RDW 14.0 13.9 14.2  PLT 174 175 162    Cardiac Enzymes Recent Labs  Lab 04/14/18 1003 04/14/18 1714 04/14/18 2346  TROPONINI 0.36* 0.53* 0.47*  Recent Labs  Lab 04/14/18 0239  TROPIPOC 0.14*     BNPNo results for input(s): BNP, PROBNP in the last 168 hours.   DDimer No results for input(s): DDIMER in the last 168 hours.   Radiology    Dg Chest Port 1 View  Result Date: 04/15/2018 CLINICAL DATA:  Cough and shortness of breath EXAM: PORTABLE CHEST 1 VIEW COMPARISON:  Apr 14, 2018 FINDINGS: There is no appreciable edema or consolidation. There is cardiomegaly with pulmonary vascularity within normal limits. Pacemaker leads are attached to the right atrium and right ventricle. There is aortic atherosclerosis. No evident adenopathy. No bone lesions. IMPRESSION: Stable cardiac prominence. Aortic atherosclerosis. Pacemaker leads attached to right atrium and right ventricle. No edema or consolidation. Aortic Atherosclerosis (ICD10-I70.0). Electronically Signed   By: Lowella Grip III M.D.   On: 04/15/2018 14:44    Cardiac Studies   Left heart cath 04/14/18:  Colon Flattery Cx lesion is 95% stenosed.  Prox LAD-1 lesion is 60% stenosed.  Prox LAD-2 lesion is 70% stenosed.  Prox RCA to Dist RCA lesion is 100% stenosed.  Prox Cx to Mid Cx lesion is 30% stenosed.  Ost 2nd Diag lesion is 70% stenosed.   Significant multivessel coronary calcification with 3 vessel CAD, and 60% proximal LAD stenosis, 70% calcified stenosis between the first and second diagonal vessel; dominant left circumflex coronary artery with new 90-95% ostial stenosis and diffuse 30% mid stenosis; total chronic mid RCA occlusion.  There is left to right collateralization to the distal RCA.  Well-seated TAVR valve.  RECOMMENDATION: With the patient's significant coronary calcification, and significant multivessel CAD with high-grade ostial circumflex stenosis in a large dominant circumflex vessel, calcified LAD disease and left-to-right collateralization to the distal RCA, will ask Dr. Ricard Dillon to reevaluate for consideration of possible CABG revascularization surgery in this patient with a history of prior TAVR.  Patient Profile     77 y.o. 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  Cardiac catheterization revealed a non-chronic total RCA, 75% calcified mid LAD and 95% calcified ostial circumflex with an acute angle of the left main.  He was seen by Dr. Roxy Manns for cardiovascular consultation who felt he was "high risk" for surgical revascularization.  We are awaiting the results of his 2D echocardiogram.   Assessment & Plan    1. NSTEMI, CAD, heart cath 04/14/18 with significant 3v disease including total chronic occlusion of the  mid RCA with left-to-right collaterals, 75% calcified mid LAD and 95% calcified ostial circumflex coronary artery. - no intervention during heart cath - consulted TCTS for possible revascularization .  He was seen by Dr. Roxy Manns he was "high risk" catheterization which I agree with. - heparin drip running  -He is currently asymptomatic on IV nitroglycerin. - echo pending   2. S/p TAVR - stable on heart cath yesterday   3. ESRD on HD - currently in HD   4. Chronic systolic and diastolic heart failure - echo 2018 with LVEF of 40% - volume management by HD and nephrology   5. Hypoxia this morning with cough - ordered CXR - if PNA, ABX per primary  I have reviewed his angiograms with Drs. Burt Knack,  and [place].  I agree that surgery is probably not a viable option and PCI is high risk given the angulation of the circumflex on the left main requiring orbital atherectomy.  He is remained asymptomatic on IV heparin and nitro.  A 2D echo is pending.  I think if his EF is severely reduced he would not be for either surgical or percutaneous revascularization but rather medical therapy.  If his LV function is at least as good as it was a year ago we will have a discussion regarding risk benefit ratio of attempted revascularization.  Patient understands the risks and is willing to have a discussion pending results of the 2D echocardiogram.   For questions or updates, please contact [place] Please consult [email or web address] for contact info under Cardiology/STEMI.      Signed, Quay Burow, MD  04/16/2018, 9:58 AM

## 2018-04-16 NOTE — Progress Notes (Signed)
Pt c/o midsternal CP radiating to left shoulder and teeth.  NTG drip titrated per orders with resolution of chest pain.  Pt endorses feelings of anxiety with episodes of chest pain as pt states "If they can't do anything they tell me I have less than six months. I don't know what I'm going to do."

## 2018-04-16 NOTE — Progress Notes (Addendum)
ANTICOAGULATION CONSULT NOTE  Pharmacy Consult:  Heparin Indication: chest pain/ACS  Allergies  Allergen Reactions  . Byetta 10 Mcg Pen [Exenatide] Diarrhea and Nausea And Vomiting  . Codeine Itching  . Coumadin [Warfarin Sodium] Rash    Patient Measurements: Height: 5\' 11"  (180.3 cm) Weight: 233 lb 4 oz (105.8 kg) IBW/kg (Calculated) : 75.3 Heparin Dosing Weight: 99  kg  Vital Signs: Temp: 97.8 F (36.6 C) (05/22 0800) Temp Source: Oral (05/22 0800) BP: 103/42 (05/22 0800) Pulse Rate: 66 (05/22 0800)  Labs: Recent Labs    04/14/18 0230 04/14/18 1000 04/14/18 1003 04/14/18 1714 04/14/18 2346 04/15/18 0311 04/15/18 1348 04/16/18 0212  HGB 10.0*  --   --   --   --  10.3*  --  9.5*  HCT 31.0*  --   --   --   --  31.1*  --  28.6*  PLT 174  --   --   --   --  175  --  162  HEPARINUNFRC  --  0.17*  --   --   --   --  0.41 0.19*  CREATININE 6.59*  --   --   --   --  7.11*  --   --   TROPONINI  --   --  0.36* 0.53* 0.47*  --   --   --     Estimated Creatinine Clearance: 10.4 mL/min (A) (by C-G formula based on SCr of 7.11 mg/dL (H)).  Assessment: 3 YOM started on heparin for ACS. 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  Hgb down slightly to 9.5, plt 162. Bleeding at fistula site since yesterday afternoon- last change was saturated in blood pernursing. Heparin level this morning came back subtherapeutic at 0.19, on 1500 units/hr.   Goal of Therapy:  Heparin level 0.3-0.5 units/ml - given bleeding Monitor platelets by anticoagulation protocol: Yes   Plan:  Increase heparin infusion at 1550 units/hr Daily heparin level and CBC Monitor for s/sx of bleeding Monitor plans per PCI  Doylene Canard, PharmD Clinical Pharmacist  Pager: [phone] Phone: [phone]

## 2018-04-16 NOTE — Progress Notes (Signed)
[name] NEPHROLOGY PROGRESS NOTE  Assessment/ Plan: Pt is a 77 y.o. 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  Status post cardiac catheter with three-vessel disease. Dialysis Orders: Center: [facility]   on mwf . EDW 106kg HD Bath 2k, 3.5 ca  Time 4hr Heparin NONE. Access LUA AVF  Hec 2 mcg IV/HD Mircera 60 mcg  q 2wks ( last on 04/02/18)  Assessment/Plan:  #Chest pain/NSTEMI: Cath showed three-vessel disease.  High risk for CT surgery.  Awaiting echocardiogram for further plan.  No chest pain today.   # ESRD:MWF, had dialysis yesterday and plan for another dialysis today to resume his a schedule. # Anemia: Hemoglobin 9.3.  Continue Aranesp. # Secondary hyperparathyroidism: Continue Sensipar, Hectorol # HTN/volume: Blood pressure lower this morning on nitroglycerin.  Continue to monitor.  May need to be off of nitroglycerin if remains hypotensive.  Subjective: Seen and examined at bedside.  No chest pain, nausea vomiting headache. Objective Vital signs in last 24 hours: Vitals:   04/15/18 2308 04/16/18 0300 04/16/18 0348 04/16/18 0800  BP: (!) 112/57 (!) 96/40  (!) 103/42  Pulse: 72 68  66  Resp: 13 20  18   Temp: 98.4 F (36.9 C)  98.6 F (37 C) 97.8 F (36.6 C)  TempSrc: Oral  Oral Oral  SpO2: 99% 96%  97%  Weight:      Height:       Weight change:   Intake/Output Summary (Last 24 hours) at 04/16/2018 1110 Last data filed at 04/16/2018 0300 Gross per 24 hour  Intake 1076.58 ml  Output 3667 ml  Net -2590.42 ml       Labs: Basic Metabolic Panel: Recent Labs  Lab 04/14/18 0230 04/15/18 0311  NA 134* 136  K 4.5 4.7  CL 97* 98*  CO2 22 23  GLUCOSE 233* 162*  BUN 61* 68*  CREATININE 6.59* 7.11*  CALCIUM 8.8* 8.5*   Liver Function Tests: No results for input(s): AST, ALT, ALKPHOS, BILITOT, PROT, ALBUMIN in the last 168 hours. No results for input(s): LIPASE, AMYLASE in the last 168 hours. No results  for input(s): AMMONIA in the last 168 hours. CBC: Recent Labs  Lab 04/14/18 0230 04/15/18 0311 04/16/18 0212  WBC 7.7 10.2 7.3  HGB 10.0* 10.3* 9.5*  HCT 31.0* 31.1* 28.6*  MCV 94.5 93.4 94.7  PLT 174 175 162   Cardiac Enzymes: Recent Labs  Lab 04/14/18 1003 04/14/18 1714 04/14/18 2346  TROPONINI 0.36* 0.53* 0.47*   CBG: Recent Labs  Lab 04/14/18 2137 04/15/18 1317 04/15/18 1802 04/15/18 2130 04/16/18 0904  GLUCAP 180* 114* 253* 259* 148*    Iron Studies: No results for input(s): IRON, TIBC, TRANSFERRIN, FERRITIN in the last 72 hours. Studies/Results: Dg Chest Port 1 View  Result Date: 04/15/2018 CLINICAL DATA:  Cough and shortness of breath EXAM: PORTABLE CHEST 1 VIEW COMPARISON:  Apr 14, 2018 FINDINGS: There is no appreciable edema or consolidation. There is cardiomegaly with pulmonary vascularity within normal limits. Pacemaker leads are attached to the right atrium and right ventricle. There is aortic atherosclerosis. No evident adenopathy. No bone lesions. IMPRESSION: Stable cardiac prominence. Aortic atherosclerosis. Pacemaker leads attached to right atrium and right ventricle. No edema or consolidation. Aortic Atherosclerosis (ICD10-I70.0). Electronically Signed   By: Lowella Grip III M.D.   On: 04/15/2018 14:44    Medications: Infusions: . sodium chloride    . sodium chloride    . heparin  1,550 Units/hr (04/16/18 1018)  . nitroGLYCERIN 5 mcg/min (04/16/18 1101)    Scheduled Medications: . aspirin EC  81 mg Oral Daily  . atorvastatin  80 mg Oral q1800  . cinacalcet  30 mg Oral Q supper  . darbepoetin (ARANESP) injection - DIALYSIS  60 mcg Intravenous Q Wed-HD  . doxercalciferol  2 mcg Intravenous Q M,W,F-HD  . famotidine  20 mg Oral Daily  . insulin aspart  0-15 Units Subcutaneous TID WC  . insulin aspart  0-5 Units Subcutaneous QHS  . insulin detemir  48 Units Subcutaneous QHS  . metoprolol tartrate  25 mg Oral BID  . sodium chloride flush  3 mL  Intravenous Q12H  . sodium chloride flush  3 mL Intravenous Q12H  . triamcinolone 0.1 % cream : eucerin   Topical TID    have reviewed scheduled and prn medications.  Physical Exam: General: Not in distress, comfortable Heart: Regular rate rhythm S1-S2 normal Lungs:clear b/l, no cracjle Abdomen:soft, Non-tender, non-distended Extremities: No edema Dialysis Access: Left AV fistula  [name] 04/16/2018,11:10 AM  LOS: 1 day

## 2018-04-16 NOTE — Plan of Care (Signed)
Continue with current plan of care.

## 2018-04-16 NOTE — Progress Notes (Signed)
[facility]                                                                              Patient Demographics  Jeffrey [name], is a 77 y.o. male, DOB - 01/05/1941, JJH:[id number]  Admit date - 04/14/2018   Admitting Physician Jeffrey Patience, MD  Outpatient Primary MD for the patient is [name], Dianna Rossetti, MD  Outpatient specialists:   LOS - 1  days   Medical records reviewed and are as summarized below:    Chief Complaint  Patient presents with  . Chest Pain       Brief summary   77 year old Caucasian male with a past medical history of diabetes, aortic stenosis status post TAVR, pacemaker placement, peripheral vascular disease hypertension, end-stage renal disease on hemodialysis, history of stroke, coronary artery disease presented with chest pain.  He was seen by cardiology.  He underwent cardiac catheterization which showed multivessel disease.  Cardiothoracic surgery was consulted by cardiology.  Assessment & Plan    Principal Problem:   NSTEMI (non-ST elevated myocardial infarction) ([place]) underlying known history of CAD -Cardiology was consulted.  Cardiac cath 5/20 showed multivessel coronary artery disease -Cardiothoracic surgery consulted, continue heparin infusion, nitro infusion, aspirin and beta-blocker -Continue statin   Active Problems:   Type 2 diabetes mellitus with chronic kidney disease on chronic dialysis, with long-term current use of insulin (HCC) -CBGs controlled, continue Levemir, sliding scale insulin   essential hypertension -Currently stable, monitor BP closely    ESRD (end stage renal disease) on dialysis (HCC) - HD MWF schedule, renal following  History of atrial fibrillation and history of heart block, status post pacemaker  -Defer to cardiology, not on anticoagulation  Rash -Unclear etiology, diffuse macular papular, continue topical agents for now, outpatient dermatology evaluation  History of aortic stenosis S/P  TAVR (transcatheter aortic valve replacement) -No acute issues  Chronic systolic CHF -EF 81% per 2D echo 6/18   Code Status: Full CODE STATUS DVT Prophylaxis: Heparin Family Communication: Discussed in detail with the patient, all imaging results, lab results explained to the patient   Disposition Plan: Awaiting CT VS recommendations  Time Spent in minutes 25 minutes  Procedures:  Cardiac cath 5/20  Ost Cx lesion is 95% stenosed.  Prox LAD-1 lesion is 60% stenosed.  Prox LAD-2 lesion is 70% stenosed.  Prox RCA to Dist RCA lesion is 100% stenosed.  Prox Cx to Mid Cx lesion is 30% stenosed.  Ost 2nd Diag lesion is 70% stenosed. Significant multivessel coronary calcification with 3 vessel CAD, and 60% proximal LAD stenosis, 70% calcified stenosis between the first and second diagonal vessel; dominant left circumflex coronary artery with new 90-95% ostial stenosis and diffuse 30% mid stenosis; total chronic mid RCA occlusion. There is left to right collateralization to the distal RCA.  Consultants:   Cardiology CT VS Nephrology  Antimicrobials:      Medications  Scheduled Meds: . aspirin EC  81 mg Oral Daily  . atorvastatin  80 mg Oral q1800  . cinacalcet  30 mg Oral Q supper  . darbepoetin (ARANESP) injection - DIALYSIS  60 mcg Intravenous Q Wed-HD  .  doxercalciferol  2 mcg Intravenous Q M,W,F-HD  . famotidine  20 mg Oral Daily  . insulin aspart  0-15 Units Subcutaneous TID WC  . insulin aspart  0-5 Units Subcutaneous QHS  . insulin detemir  48 Units Subcutaneous QHS  . metoprolol tartrate  25 mg Oral BID  . sodium chloride flush  3 mL Intravenous Q12H  . sodium chloride flush  3 mL Intravenous Q12H  . triamcinolone 0.1 % cream : eucerin   Topical TID   Continuous Infusions: . sodium chloride    . sodium chloride    . heparin 1,550 Units/hr (04/16/18 1018)  . nitroGLYCERIN 10 mcg/min (04/16/18 1101)   PRN Meds:.sodium chloride, sodium chloride,  acetaminophen, calcium carbonate (dosed in mg elemental calcium), camphor-menthol **AND** hydrOXYzine, docusate sodium, feeding supplement (NEPRO CARB STEADY), ondansetron (ZOFRAN) IV, sodium chloride flush, sodium chloride flush, sorbitol, zolpidem   Antibiotics   Anti-infectives (From admission, onward)   None        Subjective:   [facility] [name] was seen and examined today.  Really no complaints, denies any chest pain or shortness of breath. Patient denies dizziness, abdominal pain, N/V/D/C, new weakness, numbess, tingling. No acute events overnight.    Objective:   Vitals:   04/15/18 2308 04/16/18 0300 04/16/18 0348 04/16/18 0800  BP: (!) 112/57 (!) 96/40  (!) 103/42  Pulse: 72 68  66  Resp: 13 20  18   Temp: 98.4 F (36.9 C)  98.6 F (37 C) 97.8 F (36.6 C)  TempSrc: Oral  Oral Oral  SpO2: 99% 96%  97%  Weight:      Height:        Intake/Output Summary (Last 24 hours) at 04/16/2018 1130 Last data filed at 04/16/2018 0300 Gross per 24 hour  Intake 1076.58 ml  Output 3667 ml  Net -2590.42 ml     Wt Readings from Last 3 Encounters:  04/15/18 105.8 kg (233 lb 4 oz)  02/18/18 109.3 kg (241 lb)  11/14/17 107.5 kg (237 lb)     Exam  General: Alert and oriented x 3, NAD  Eyes:   HEENT:  Atraumatic, normocephalic  Cardiovascular: S1 S2 auscultated, regular rate and rhythm.  Respiratory: Clear to auscultation bilaterally, no wheezing, rales or rhonchi  Gastrointestinal: Soft, nontender, nondistended, + bowel sounds  Ext: no pedal edema bilaterally  Neuro: no new deficits  Musculoskeletal: No digital cyanosis, clubbing  Skin: No rashes  Psych: Normal affect and demeanor, alert and oriented x3    Data Reviewed:  I have personally reviewed following labs and imaging studies  Micro Results Recent Results (from the past 240 hour(s))  MRSA PCR Screening     Status: None   Collection Time: 04/14/18  6:01 PM  Result Value Ref Range Status   MRSA by PCR  NEGATIVE NEGATIVE Final    Comment:        The GeneXpert MRSA Assay (FDA approved for NASAL specimens only), is one component of a comprehensive MRSA colonization surveillance program. It is not intended to diagnose MRSA infection nor to guide or monitor treatment for MRSA infections. Performed at [facility], [place] [address], [place], [place] [address]     Radiology Reports Dg Chest 2 View  Result Date: 04/14/2018 CLINICAL DATA:  Chest pain EXAM: CHEST - 2 VIEW COMPARISON:  11/14/2017 FINDINGS: Right-sided pacing device as before, incompletely visualized ventricular leads. Cardiac valve. Incomplete inclusion of the lung bases. Hyperinflation. No pleural effusion. Cardiomegaly. Aortic atherosclerosis. No pneumothorax. IMPRESSION: No  active cardiopulmonary disease.  Cardiomegaly Electronically Signed   By: Donavan Foil M.D.   On: 04/14/2018 03:17   Dg Chest Port 1 View  Result Date: 04/15/2018 CLINICAL DATA:  Cough and shortness of breath EXAM: PORTABLE CHEST 1 VIEW COMPARISON:  Apr 14, 2018 FINDINGS: There is no appreciable edema or consolidation. There is cardiomegaly with pulmonary vascularity within normal limits. Pacemaker leads are attached to the right atrium and right ventricle. There is aortic atherosclerosis. No evident adenopathy. No bone lesions. IMPRESSION: Stable cardiac prominence. Aortic atherosclerosis. Pacemaker leads attached to right atrium and right ventricle. No edema or consolidation. Aortic Atherosclerosis (ICD10-I70.0). Electronically Signed   By: Lowella Grip III M.D.   On: 04/15/2018 14:44    Lab Data:  CBC: Recent Labs  Lab 04/14/18 0230 04/15/18 0311 04/16/18 0212  WBC 7.7 10.2 7.3  HGB 10.0* 10.3* 9.5*  HCT 31.0* 31.1* 28.6*  MCV 94.5 93.4 94.7  PLT 174 175 563   Basic Metabolic Panel: Recent Labs  Lab 04/14/18 0230 04/15/18 0311  NA 134* 136  K 4.5 4.7  CL 97* 98*  CO2 22 23  GLUCOSE 233* 162*  BUN 61* 68*  CREATININE 6.59*  7.11*  CALCIUM 8.8* 8.5*   GFR: Estimated Creatinine Clearance: 10.4 mL/min (A) (by C-G formula based on SCr of 7.11 mg/dL (H)). Liver Function Tests: No results for input(s): AST, ALT, ALKPHOS, BILITOT, PROT, ALBUMIN in the last 168 hours. No results for input(s): LIPASE, AMYLASE in the last 168 hours. No results for input(s): AMMONIA in the last 168 hours. Coagulation Profile: No results for input(s): INR, PROTIME in the last 168 hours. Cardiac Enzymes: Recent Labs  Lab 04/14/18 1003 04/14/18 1714 04/14/18 2346  TROPONINI 0.36* 0.53* 0.47*   BNP (last 3 results) No results for input(s): PROBNP in the last 8760 hours. HbA1C: Recent Labs    04/14/18 1003  HGBA1C 7.4*   CBG: Recent Labs  Lab 04/14/18 2137 04/15/18 1317 04/15/18 1802 04/15/18 2130 04/16/18 0904  GLUCAP 180* 114* 253* 259* 148*   Lipid Profile: Recent Labs    04/15/18 0311  CHOL 80  HDL 22*  LDLCALC 22  TRIG 179*  CHOLHDL 3.6   Thyroid Function Tests: Recent Labs    04/14/18 1003  TSH 3.286   Anemia Panel: No results for input(s): VITAMINB12, FOLATE, FERRITIN, TIBC, IRON, RETICCTPCT in the last 72 hours. Urine analysis:    Component Value Date/Time   COLORURINE YELLOW 06/17/2017 [place] 06/17/2017 0547   LABSPEC 1.010 06/17/2017 0547   PHURINE 6.0 06/17/2017 0547   GLUCOSEU >=500 (A) 06/17/2017 0547   HGBUR SMALL (A) 06/17/2017 0547   BILIRUBINUR NEGATIVE 06/17/2017 0547   KETONESUR NEGATIVE 06/17/2017 0547   PROTEINUR 100 (A) 06/17/2017 0547   UROBILINOGEN 0.2 09/28/2014 2257   NITRITE NEGATIVE 06/17/2017 0547   LEUKOCYTESUR TRACE (A) 06/17/2017 0547     Willadeen Colantuono M.D. [facility] 04/16/2018, 11:30 AM  Pager: [phone] Between 7am to 7pm - call Pager - [phone]  After 7pm go to [email or web address] - password [email or web address]  Call night coverage person covering after 7pm

## 2018-04-17 ENCOUNTER — Other Ambulatory Visit: Payer: Self-pay

## 2018-04-17 ENCOUNTER — Other Ambulatory Visit (HOSPITAL_COMMUNITY): Payer: Medicare Other

## 2018-04-17 ENCOUNTER — Inpatient Hospital Stay (HOSPITAL_COMMUNITY): Payer: Medicare Other

## 2018-04-17 DIAGNOSIS — I351 Nonrheumatic aortic (valve) insufficiency: Secondary | ICD-10-CM

## 2018-04-17 DIAGNOSIS — E7800 Pure hypercholesterolemia, unspecified: Secondary | ICD-10-CM

## 2018-04-17 LAB — ECHOCARDIOGRAM COMPLETE
Height: 71 in
Weight: 3731.95 oz

## 2018-04-17 LAB — GLUCOSE, CAPILLARY
GLUCOSE-CAPILLARY: 197 mg/dL — AB (ref 65–99)
GLUCOSE-CAPILLARY: 82 mg/dL (ref 65–99)
Glucose-Capillary: 243 mg/dL — ABNORMAL HIGH (ref 65–99)
Glucose-Capillary: 142 mg/dL — ABNORMAL HIGH (ref 65–99)

## 2018-04-17 LAB — HEPARIN LEVEL (UNFRACTIONATED)
HEPARIN UNFRACTIONATED: 0.25 [IU]/mL — AB (ref 0.30–0.70)
HEPARIN UNFRACTIONATED: 0.22 [IU]/mL — AB (ref 0.30–0.70)

## 2018-04-17 LAB — CBC
HCT: 27.6 % — ABNORMAL LOW (ref 39.0–52.0)
HEMOGLOBIN: 9 g/dL — AB (ref 13.0–17.0)
MCH: 30.6 pg (ref 26.0–34.0)
MCHC: 32.6 g/dL (ref 30.0–36.0)
MCV: 93.9 fL (ref 78.0–100.0)
Platelets: 155 10*3/uL (ref 150–400)
RBC: 2.94 MIL/uL — ABNORMAL LOW (ref 4.22–5.81)
RDW: 14.2 % (ref 11.5–15.5)
WBC: 8.1 10*3/uL (ref 4.0–10.5)

## 2018-04-17 MED ORDER — DARBEPOETIN ALFA 60 MCG/0.3ML IJ SOSY
60.0000 ug | PREFILLED_SYRINGE | INTRAMUSCULAR | Status: DC
  Administered 2018-04-17: 60 ug via INTRAVENOUS
  Filled 2018-04-17: qty 0.3

## 2018-04-17 MED ORDER — DOXERCALCIFEROL 4 MCG/2ML IV SOLN
INTRAVENOUS | Status: AC
  Administered 2018-04-17: 2 ug via INTRAVENOUS
  Filled 2018-04-17: qty 2

## 2018-04-17 MED ORDER — INSULIN ASPART 100 UNIT/ML ~~LOC~~ SOLN
4.0000 [IU] | Freq: Three times a day (TID) | SUBCUTANEOUS | Status: DC
  Administered 2018-04-17 – 2018-04-19 (×5): 4 [IU] via SUBCUTANEOUS

## 2018-04-17 MED ORDER — CHLORHEXIDINE GLUCONATE CLOTH 2 % EX PADS
6.0000 | MEDICATED_PAD | Freq: Every day | CUTANEOUS | Status: DC
  Administered 2018-04-18 – 2018-04-19 (×2): 6 via TOPICAL

## 2018-04-17 MED ORDER — INSULIN DETEMIR 100 UNIT/ML ~~LOC~~ SOLN
50.0000 [IU] | Freq: Every day | SUBCUTANEOUS | Status: DC
  Administered 2018-04-17 – 2018-04-19 (×2): 50 [IU] via SUBCUTANEOUS
  Filled 2018-04-17 (×2): qty 0.5

## 2018-04-17 MED ORDER — DARBEPOETIN ALFA 60 MCG/0.3ML IJ SOSY
PREFILLED_SYRINGE | INTRAMUSCULAR | Status: AC
  Administered 2018-04-17: 60 ug via INTRAVENOUS
  Filled 2018-04-17: qty 0.3

## 2018-04-17 NOTE — Progress Notes (Signed)
ANTICOAGULATION CONSULT NOTE  Pharmacy Consult:  Heparin Indication: chest pain/ACS  Allergies  Allergen Reactions  . Byetta 10 Mcg Pen [Exenatide] Diarrhea and Nausea And Vomiting  . Codeine Itching  . Coumadin [Warfarin Sodium] Rash    Patient Measurements: Height: 5\' 11"  (180.3 cm) Weight: 248 lb 0.3 oz (112.5 kg) IBW/kg (Calculated) : 75.3 Heparin Dosing Weight: 99  kg  Vital Signs: Temp: 97.8 F (36.6 C) (05/23 1317) Temp Source: Oral (05/23 1317) BP: 97/22 (05/23 1400) Pulse Rate: 64 (05/23 1400)  Labs: Recent Labs    04/14/18 1714 04/14/18 2346  04/15/18 0311  04/16/18 0212 04/16/18 1642 04/17/18 0219 04/17/18 1207  HGB  --   --    < > 10.3*  --  9.5*  --  9.0*  --   HCT  --   --   --  31.1*  --  28.6*  --  27.6*  --   PLT  --   --   --  175  --  162  --  155  --   HEPARINUNFRC  --   --   --   --    < > 0.19* 0.21* 0.25* 0.22*  CREATININE  --   --   --  7.11*  --   --   --   --   --   TROPONINI 0.53* 0.47*  --   --   --   --   --   --   --    < > = values in this interval not displayed.    Estimated Creatinine Clearance: 10.7 mL/min (A) (by C-G formula based on SCr of 7.11 mg/dL (H)).  Assessment: 89 YOM started on heparin for ACS. 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  Heparin level this morning was 0.25, on 1650 units/hr- there was no change given bleeding. Repeat heparin level came back subtherpaeutic at 0.22. Given repeat level subtherapeutic, will conservatively increase heparin infusion. IV line was removed and was bleeding for extended period of time per nursing. Hgb 9, plt 155. Level is being drawn peripherally.  Goal of Therapy:  Heparin level 0.3-0.5 units/ml - given bleeding Monitor platelets by anticoagulation protocol: Yes   Plan:  Increase heparin infusion at 1700 units/hr Daily heparin level and CBC  Doylene Canard, PharmD Clinical Pharmacist  Pager: [phone] Phone: [phone] 04/17/2018 2:30 PM

## 2018-04-17 NOTE — Progress Notes (Signed)
Pts. Family anxious about results of ECHO. MD paged.

## 2018-04-17 NOTE — Progress Notes (Signed)
[facility]                                                                              Patient Demographics  Jeffrey [name], is a 77 y.o. male, DOB - Jul 07, 1941, HQP:[id number]  Admit date - 04/14/2018   Admitting Physician Rise Patience, MD  Outpatient Primary MD for the patient is [name], Dianna Rossetti, MD  Outpatient specialists:   LOS - 2  days   Medical records reviewed and are as summarized below:    Chief Complaint  Patient presents with  . Chest Pain       Brief summary   77 year old Caucasian male with a past medical history of diabetes, aortic stenosis status post TAVR, pacemaker placement, peripheral vascular disease hypertension, end-stage renal disease on hemodialysis, history of stroke, coronary artery disease presented with chest pain.  He was seen by cardiology.  He underwent cardiac catheterization which showed multivessel disease.  Cardiothoracic surgery was consulted by cardiology.  Assessment & Plan    Principal Problem:   NSTEMI (non-ST elevated myocardial infarction) ([place]) underlying known history of CAD -Cardiology was consulted.  Cardiac cath 5/20 showed multivessel coronary artery disease including total chronic occlusion of the mid RCA with left-to-right collaterals, 75% calcified mid LAD and 95% calcified ostial circumflex coronary artery.  No intervention done during the cardiac cath. -Cardiothoracic surgery was consulted, seen by Dr. Roxy Manns, felt high risk for surgical revascularization, follow 2D echo results -Continue statin  -2D echo showed EF of 45 to 50% with regional wall motion abnormalities, awaiting cardiology recommendations regarding management versus interventional strategy.  Discussed with the son and wife at the bedside  Active Problems:   Type 2 diabetes mellitus with chronic kidney disease on chronic dialysis, with long-term current use of insulin (HCC) -CBGs elevated, 243 this morning -Increase Levemir to 50 units  at bedtime, added NovoLog meal coverage, continue sliding scale insulin    essential hypertension -Currently stable, monitor BP closely    ESRD (end stage renal disease) on dialysis (HCC) - HD MWF schedule, renal following  Chronic atrial fibrillation and history of heart block, status post pacemaker  -Defer to cardiology, not on anticoagulation  Rash -Unclear etiology, diffuse macular papular, continue topical agents for now, outpatient dermatology evaluation  History of aortic stenosis S/P TAVR (transcatheter aortic valve replacement) -No acute issues  Chronic systolic CHF -2D echo showed EF of 45 to 50%   Code Status: Full CODE STATUS DVT Prophylaxis: Heparin Family Communication: Discussed in detail with the patient, all imaging results, lab results explained to the patient   Disposition Plan: Awaiting cardiology recommendation  Time Spent in minutes 25 minutes  Procedures:  Cardiac cath 5/20  Ost Cx lesion is 95% stenosed.  Prox LAD-1 lesion is 60% stenosed.  Prox LAD-2 lesion is 70% stenosed.  Prox RCA to Dist RCA lesion is 100% stenosed.  Prox Cx to Mid Cx lesion is 30% stenosed.  Ost 2nd Diag lesion is 70% stenosed. Significant multivessel coronary calcification with 3 vessel CAD, and 60% proximal LAD stenosis, 70% calcified stenosis between the first and second diagonal vessel; dominant left circumflex coronary artery with  new 90-95% ostial stenosis and diffuse 30% mid stenosis; total chronic mid RCA occlusion. There is left to right collateralization to the distal RCA.  Consultants:   Cardiology CT VS Nephrology  Antimicrobials:      Medications  Scheduled Meds: . aspirin EC  81 mg Oral Daily  . atorvastatin  80 mg Oral q1800  . Chlorhexidine Gluconate Cloth  6 each Topical Q0600  . cinacalcet  30 mg Oral Q supper  . darbepoetin (ARANESP) injection - DIALYSIS  60 mcg Intravenous Q Thu-HD  . doxercalciferol  2 mcg Intravenous Q M,W,F-HD  .  famotidine  20 mg Oral Daily  . insulin aspart  0-15 Units Subcutaneous TID WC  . insulin aspart  0-5 Units Subcutaneous QHS  . insulin detemir  48 Units Subcutaneous QHS  . metoprolol tartrate  25 mg Oral BID  . sodium chloride flush  3 mL Intravenous Q12H  . sodium chloride flush  3 mL Intravenous Q12H  . triamcinolone 0.1 % cream : eucerin   Topical TID   Continuous Infusions: . sodium chloride    . sodium chloride    . heparin 1,650 Units/hr (04/17/18 0865)  . nitroGLYCERIN 15 mcg/min (04/17/18 1025)   PRN Meds:.sodium chloride, sodium chloride, acetaminophen, calcium carbonate (dosed in mg elemental calcium), camphor-menthol **AND** hydrOXYzine, docusate sodium, feeding supplement (NEPRO CARB STEADY), ondansetron (ZOFRAN) IV, sodium chloride flush, sodium chloride flush, sorbitol, zolpidem   Antibiotics   Anti-infectives (From admission, onward)   None        Subjective:   Jeffrey [name] was seen and examined today.  No acute complaints, no chest pain or shortness of breath.  Patient denies dizziness, abdominal pain, N/V/D/C, new weakness, numbess, tingling. No acute events overnight.    Objective:   Vitals:   04/17/18 0630 04/17/18 0730 04/17/18 0800 04/17/18 0835  BP: (!) 123/46 (!) 78/63 (!) 144/53 (!) 123/47  Pulse: 89 73 73 68  Resp: 19 16 19    Temp:  97.7 F (36.5 C)    TempSrc:  Oral    SpO2:  99% 100%   Weight:      Height:        Intake/Output Summary (Last 24 hours) at 04/17/2018 1118 Last data filed at 04/16/2018 1500 Gross per 24 hour  Intake 539.68 ml  Output -  Net 539.68 ml     Wt Readings from Last 3 Encounters:  04/15/18 105.8 kg (233 lb 4 oz)  02/18/18 109.3 kg (241 lb)  11/14/17 107.5 kg (237 lb)     Exam    General: Alert and oriented x 3, NAD  Eyes:   HEENT:   Cardiovascular: S1 S2 auscultated, RRR. No pedal edema b/l  Respiratory: Clear to auscultation bilaterally  Gastrointestinal: Soft, nontender, nondistended, +  bowel sounds  Ext: no pedal edema bilaterally  Neuro: no FND  Musculoskeletal: No digital cyanosis, clubbing  Skin: No rashes  Psych: Normal affect and demeanor, alert and oriented x3    Data Reviewed:  I have personally reviewed following labs and imaging studies  Micro Results Recent Results (from the past 240 hour(s))  MRSA PCR Screening     Status: None   Collection Time: 04/14/18  6:01 PM  Result Value Ref Range Status   MRSA by PCR NEGATIVE NEGATIVE Final    Comment:        The GeneXpert MRSA Assay (FDA approved for NASAL specimens only), is one component of a comprehensive MRSA colonization surveillance program. It is not intended  to diagnose MRSA infection nor to guide or monitor treatment for MRSA infections. Performed at [facility], [place] [address], [place], [place] [address]     Radiology Reports Dg Chest 2 View  Result Date: 04/14/2018 CLINICAL DATA:  Chest pain EXAM: CHEST - 2 VIEW COMPARISON:  11/14/2017 FINDINGS: Right-sided pacing device as before, incompletely visualized ventricular leads. Cardiac valve. Incomplete inclusion of the lung bases. Hyperinflation. No pleural effusion. Cardiomegaly. Aortic atherosclerosis. No pneumothorax. IMPRESSION: No active cardiopulmonary disease.  Cardiomegaly Electronically Signed   By: Donavan Foil M.D.   On: 04/14/2018 03:17   Dg Chest Port 1 View  Result Date: 04/15/2018 CLINICAL DATA:  Cough and shortness of breath EXAM: PORTABLE CHEST 1 VIEW COMPARISON:  Apr 14, 2018 FINDINGS: There is no appreciable edema or consolidation. There is cardiomegaly with pulmonary vascularity within normal limits. Pacemaker leads are attached to the right atrium and right ventricle. There is aortic atherosclerosis. No evident adenopathy. No bone lesions. IMPRESSION: Stable cardiac prominence. Aortic atherosclerosis. Pacemaker leads attached to right atrium and right ventricle. No edema or consolidation. Aortic Atherosclerosis  (ICD10-I70.0). Electronically Signed   By: Lowella Grip III M.D.   On: 04/15/2018 14:44    Lab Data:  CBC: Recent Labs  Lab 04/14/18 0230 04/15/18 0311 04/16/18 0212 04/17/18 0219  WBC 7.7 10.2 7.3 8.1  HGB 10.0* 10.3* 9.5* 9.0*  HCT 31.0* 31.1* 28.6* 27.6*  MCV 94.5 93.4 94.7 93.9  PLT 174 175 162 789   Basic Metabolic Panel: Recent Labs  Lab 04/14/18 0230 04/15/18 0311  NA 134* 136  K 4.5 4.7  CL 97* 98*  CO2 22 23  GLUCOSE 233* 162*  BUN 61* 68*  CREATININE 6.59* 7.11*  CALCIUM 8.8* 8.5*   GFR: Estimated Creatinine Clearance: 10.4 mL/min (A) (by C-G formula based on SCr of 7.11 mg/dL (H)). Liver Function Tests: No results for input(s): AST, ALT, ALKPHOS, BILITOT, PROT, ALBUMIN in the last 168 hours. No results for input(s): LIPASE, AMYLASE in the last 168 hours. No results for input(s): AMMONIA in the last 168 hours. Coagulation Profile: No results for input(s): INR, PROTIME in the last 168 hours. Cardiac Enzymes: Recent Labs  Lab 04/14/18 1003 04/14/18 1714 04/14/18 2346  TROPONINI 0.36* 0.53* 0.47*   BNP (last 3 results) No results for input(s): PROBNP in the last 8760 hours. HbA1C: No results for input(s): HGBA1C in the last 72 hours. CBG: Recent Labs  Lab 04/16/18 0904 04/16/18 1228 04/16/18 1648 04/16/18 2147 04/17/18 0808  GLUCAP 148* 200* 275* 185* 243*   Lipid Profile: Recent Labs    04/15/18 0311  CHOL 80  HDL 22*  LDLCALC 22  TRIG 179*  CHOLHDL 3.6   Thyroid Function Tests: No results for input(s): TSH, T4TOTAL, FREET4, T3FREE, THYROIDAB in the last 72 hours. Anemia Panel: No results for input(s): VITAMINB12, FOLATE, FERRITIN, TIBC, IRON, RETICCTPCT in the last 72 hours. Urine analysis:    Component Value Date/Time   COLORURINE YELLOW 06/17/2017 [place] 06/17/2017 0547   LABSPEC 1.010 06/17/2017 0547   PHURINE 6.0 06/17/2017 0547   GLUCOSEU >=500 (A) 06/17/2017 0547   HGBUR SMALL (A) 06/17/2017  0547   BILIRUBINUR NEGATIVE 06/17/2017 0547   KETONESUR NEGATIVE 06/17/2017 0547   PROTEINUR 100 (A) 06/17/2017 0547   UROBILINOGEN 0.2 09/28/2014 2257   NITRITE NEGATIVE 06/17/2017 0547   LEUKOCYTESUR TRACE (A) 06/17/2017 0547     Ripudeep Rai M.D. [facility] 04/17/2018, 11:18 AM  Pager: [phone] Between  7am to 7pm - call Pager - [phone]  After 7pm go to [email or web address] - password [email or web address]  Call night coverage person covering after 7pm

## 2018-04-17 NOTE — Progress Notes (Signed)
  Echocardiogram 2D Echocardiogram has been performed.  Jeffrey [name] 04/17/2018, 10:14 AM

## 2018-04-17 NOTE — Progress Notes (Addendum)
[facility] NEPHROLOGY PROGRESS NOTE  Assessment/ Plan: Pt is a 77 y.o. yo male  with ESRD Chronic HD on  MWF ( [place] and compliant ) admitted with non-STEMI.  Status post cardiac catheter with three-vessel disease. Dialysis Orders: Center: [facility]   on mwf . EDW 106kg HD [place] 2k, 3.5 ca  Time 4hr Heparin NONE. Access LUA AVF  Hec 2 mcg IV/HD Mircera 60 mcg  q 2wks ( last on 04/02/18)  Assessment/Plan:  #Chest pain/NSTEMI: Cath showed three-vessel disease.  High risk for CT surgery.  Awaiting echocardiogram for further plan.  No chest pain today.  Discussed with the patient and family today. # ESRD:MWF, unable to do dialysis yesterday because of emergent cases and being on nitroglycerin titration.  Plan for dialysis today.  He has no chest pain or shortness of breath.  Order for dialysis for tomorrow as well to resume his MWF schdedule. # Anemia: Hemoglobin 9.  Continue Aranesp. # Secondary hyperparathyroidism: Continue Sensipar, Hectorol # HTN/volume: Blood pressure acceptable now.  Monitor blood pressure.   May need to be off of nitroglycerin if remains hypotensive.  Subjective: Seen and examined at bedside.  No chest pain, shortness of breath, nausea vomiting.  Family members including wife and son at bedside. Objective Vital signs in last 24 hours: Vitals:   04/17/18 0630 04/17/18 0730 04/17/18 0800 04/17/18 0835  BP: (!) 123/46 (!) 78/63 (!) 144/53 (!) 123/47  Pulse: 89 73 73 68  Resp: 19 16 19    Temp:  97.7 F (36.5 C)    TempSrc:  Oral    SpO2:  99% 100%   Weight:      Height:       Weight change:   Intake/Output Summary (Last 24 hours) at 04/17/2018 0932 Last data filed at 04/16/2018 1500 Gross per 24 hour  Intake 899.13 ml  Output -  Net 899.13 ml       Labs: Basic Metabolic Panel: Recent Labs  Lab 04/14/18 0230 04/15/18 0311  NA 134* 136  K 4.5 4.7  CL 97* 98*  CO2 22 23  GLUCOSE 233* 162*  BUN 61* 68*  CREATININE 6.59* 7.11*  CALCIUM  8.8* 8.5*   Liver Function Tests: No results for input(s): AST, ALT, ALKPHOS, BILITOT, PROT, ALBUMIN in the last 168 hours. No results for input(s): LIPASE, AMYLASE in the last 168 hours. No results for input(s): AMMONIA in the last 168 hours. CBC: Recent Labs  Lab 04/14/18 0230 04/15/18 0311 04/16/18 0212 04/17/18 0219  WBC 7.7 10.2 7.3 8.1  HGB 10.0* 10.3* 9.5* 9.0*  HCT 31.0* 31.1* 28.6* 27.6*  MCV 94.5 93.4 94.7 93.9  PLT 174 175 162 155   Cardiac Enzymes: Recent Labs  Lab 04/14/18 1003 04/14/18 1714 04/14/18 2346  TROPONINI 0.36* 0.53* 0.47*   CBG: Recent Labs  Lab 04/16/18 0904 04/16/18 1228 04/16/18 1648 04/16/18 2147 04/17/18 0808  GLUCAP 148* 200* 275* 185* 243*    Iron Studies: No results for input(s): IRON, TIBC, TRANSFERRIN, FERRITIN in the last 72 hours. Studies/Results: Dg Chest Port 1 View  Result Date: 04/15/2018 CLINICAL DATA:  Cough and shortness of breath EXAM: PORTABLE CHEST 1 VIEW COMPARISON:  Apr 14, 2018 FINDINGS: There is no appreciable edema or consolidation. There is cardiomegaly with pulmonary vascularity within normal limits. Pacemaker leads are attached to the right atrium and right ventricle. There is aortic atherosclerosis. No evident adenopathy. No bone lesions. IMPRESSION: Stable cardiac prominence. Aortic atherosclerosis. Pacemaker leads attached to right atrium  and right ventricle. No edema or consolidation. Aortic Atherosclerosis (ICD10-I70.0). Electronically Signed   By: Lowella Grip III M.D.   On: 04/15/2018 14:44    Medications: Infusions: . sodium chloride    . sodium chloride    . heparin 1,650 Units/hr (04/17/18 9169)  . nitroGLYCERIN 15 mcg/min (04/17/18 0339)    Scheduled Medications: . aspirin EC  81 mg Oral Daily  . atorvastatin  80 mg Oral q1800  . cinacalcet  30 mg Oral Q supper  . darbepoetin (ARANESP) injection - DIALYSIS  60 mcg Intravenous Q Thu-HD  . doxercalciferol  2 mcg Intravenous Q M,W,F-HD  .  famotidine  20 mg Oral Daily  . insulin aspart  0-15 Units Subcutaneous TID WC  . insulin aspart  0-5 Units Subcutaneous QHS  . insulin detemir  48 Units Subcutaneous QHS  . metoprolol tartrate  25 mg Oral BID  . sodium chloride flush  3 mL Intravenous Q12H  . sodium chloride flush  3 mL Intravenous Q12H  . triamcinolone 0.1 % cream : eucerin   Topical TID    have reviewed scheduled and prn medications.  Physical Exam: General: Lying in bed, not in distress Heart: Regular rate rhythm S1-S2 normal Lungs: Clear bilateral, no crackles Abdomen:soft, Non-tender, non-distended Extremities: No edema. Dialysis Access: Left AV fistula  Jeffrey [name] Tanna Furry 04/17/2018,9:32 AM  LOS: 2 days

## 2018-04-17 NOTE — Progress Notes (Signed)
Progress Note  Patient Name: Jeffrey [name] Date of Encounter: 04/17/2018  Primary Cardiologist: [name], MD   Subjective   No chest pain since yesterday.  Hemodynamically stable.  Awaiting 2D echo.  Inpatient Medications    Scheduled Meds: . aspirin EC  81 mg Oral Daily  . atorvastatin  80 mg Oral q1800  . cinacalcet  30 mg Oral Q supper  . darbepoetin (ARANESP) injection - DIALYSIS  60 mcg Intravenous Q Wed-HD  . doxercalciferol  2 mcg Intravenous Q M,W,F-HD  . famotidine  20 mg Oral Daily  . insulin aspart  0-15 Units Subcutaneous TID WC  . insulin aspart  0-5 Units Subcutaneous QHS  . insulin detemir  48 Units Subcutaneous QHS  . metoprolol tartrate  25 mg Oral BID  . sodium chloride flush  3 mL Intravenous Q12H  . sodium chloride flush  3 mL Intravenous Q12H  . triamcinolone 0.1 % cream : eucerin   Topical TID   Continuous Infusions: . sodium chloride    . sodium chloride    . heparin 1,650 Units/hr (04/17/18 6294)  . nitroGLYCERIN 15 mcg/min (04/17/18 0339)   PRN Meds: sodium chloride, sodium chloride, acetaminophen, calcium carbonate (dosed in mg elemental calcium), camphor-menthol **AND** hydrOXYzine, docusate sodium, feeding supplement (NEPRO CARB STEADY), ondansetron (ZOFRAN) IV, sodium chloride flush, sodium chloride flush, sorbitol, zolpidem   Vital Signs    Vitals:   04/17/18 0630 04/17/18 0730 04/17/18 0800 04/17/18 0835  BP: (!) 123/46 (!) 78/63 (!) 144/53 (!) 123/47  Pulse: 89 73 73 68  Resp: 19 16 19    Temp:  97.7 F (36.5 C)    TempSrc:  Oral    SpO2:  99% 100%   Weight:      Height:        Intake/Output Summary (Last 24 hours) at 04/17/2018 0846 Last data filed at 04/16/2018 1500 Gross per 24 hour  Intake 899.13 ml  Output -  Net 899.13 ml   Filed Weights   04/14/18 0337 04/15/18 0800 04/15/18 1210  Weight: 241 lb (109.3 kg) 241 lb 6.5 oz (109.5 kg) 233 lb 4 oz (105.8 kg)    Telemetry    Paced rhythm - Personally  Reviewed  ECG    No new tracings - Personally Reviewed  Physical Exam   GEN: No acute distress.   Neck: No JVD Cardiac: RRR, no murmurs, rubs, or gallops.  Respiratory: Clear to auscultation bilaterally. GI: Soft, nontender, non-distended  MS: No edema; No deformity. Neuro:  Nonfocal  Psych: Normal affect   Labs    Chemistry Recent Labs  Lab 04/14/18 0230 04/15/18 0311  NA 134* 136  K 4.5 4.7  CL 97* 98*  CO2 22 23  GLUCOSE 233* 162*  BUN 61* 68*  CREATININE 6.59* 7.11*  CALCIUM 8.8* 8.5*  GFRNONAA 7* 6*  GFRAA 8* 8*  ANIONGAP 15 15     Hematology Recent Labs  Lab 04/15/18 0311 04/16/18 0212 04/17/18 0219  WBC 10.2 7.3 8.1  RBC 3.33* 3.02* 2.94*  HGB 10.3* 9.5* 9.0*  HCT 31.1* 28.6* 27.6*  MCV 93.4 94.7 93.9  MCH 30.9 31.5 30.6  MCHC 33.1 33.2 32.6  RDW 13.9 14.2 14.2  PLT 175 162 155    Cardiac Enzymes Recent Labs  Lab 04/14/18 1003 04/14/18 1714 04/14/18 2346  TROPONINI 0.36* 0.53* 0.47*    Recent Labs  Lab 04/14/18 0239  TROPIPOC 0.14*     BNPNo results for input(s): BNP, PROBNP in the  last 168 hours.   DDimer No results for input(s): DDIMER in the last 168 hours.   Radiology    Dg Chest Port 1 View  Result Date: 04/15/2018 CLINICAL DATA:  Cough and shortness of breath EXAM: PORTABLE CHEST 1 VIEW COMPARISON:  Apr 14, 2018 FINDINGS: There is no appreciable edema or consolidation. There is cardiomegaly with pulmonary vascularity within normal limits. Pacemaker leads are attached to the right atrium and right ventricle. There is aortic atherosclerosis. No evident adenopathy. No bone lesions. IMPRESSION: Stable cardiac prominence. Aortic atherosclerosis. Pacemaker leads attached to right atrium and right ventricle. No edema or consolidation. Aortic Atherosclerosis (ICD10-I70.0). Electronically Signed   By: Lowella Grip III M.D.   On: 04/15/2018 14:44    Cardiac Studies   Left heart cath 04/14/18:  [place] Cx lesion is 95%  stenosed.  Prox LAD-1 lesion is 60% stenosed.  Prox LAD-2 lesion is 70% stenosed.  Prox RCA to Dist RCA lesion is 100% stenosed.  Prox Cx to Mid Cx lesion is 30% stenosed.  Ost 2nd Diag lesion is 70% stenosed.   Significant multivessel coronary calcification with 3 vessel CAD, and 60% proximal LAD stenosis, 70% calcified stenosis between the first and second diagonal vessel; dominant left circumflex coronary artery with new 90-95% ostial stenosis and diffuse 30% mid stenosis; total chronic mid RCA occlusion.  There is left to right collateralization to the distal RCA.  Well-seated TAVR valve.  RECOMMENDATION: With the patient's significant coronary calcification, and significant multivessel CAD with high-grade ostial circumflex stenosis in a large dominant circumflex vessel, calcified LAD disease and left-to-right collateralization to the distal RCA, will ask Dr. Ricard Dillon to reevaluate for consideration of possible CABG revascularization surgery in this patient with a history of prior TAVR.  Patient Profile     77 y.o. 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  Cardiac catheterization revealed a non-chronic total RCA, 75% calcified mid LAD and 95% calcified ostial circumflex with an acute angle of the left main.  He was seen by Dr. Roxy Manns for cardiovascular consultation who felt he was "high risk" for surgical revascularization.  We are awaiting the results of his 2D echocardiogram.   Assessment & Plan    1. NSTEMI, CAD, heart cath 04/14/18 with significant 3v disease including total chronic occlusion of the mid RCA with left-to-right collaterals, 75% calcified mid LAD and 95% calcified ostial circumflex coronary artery. -  no intervention during heart cath - consulted TCTS for possible revascularization .  He was seen by Dr. Roxy Manns he was "high risk" catheterization which I agree with. - heparin drip running  -He is currently asymptomatic on IV nitroglycerin. - echo pending   2. S/p TAVR - stable on heart cath yesterday   3. ESRD on HD - currently in HD   4. Chronic systolic and diastolic heart failure - echo 2018 with LVEF of 40% - volume management by HD and nephrology   5. Hypoxia this morning with cough - ordered CXR - if PNA, ABX per primary  I have reviewed his angiograms with Drs. Burt Knack, and [place].  I agree that surgery is probably not a viable option and PCI is high risk given the angulation  of the circumflex on the left main requiring orbital atherectomy.  He is remained asymptomatic on IV heparin and nitro.  A 2D echo is pending.  I think if his EF is severely reduced he would not be for either surgical or percutaneous revascularization but rather medical therapy.  If his LV function is at least as good as it was a year ago we will have a discussion regarding risk benefit ratio of attempted revascularization.  Patient understands the risks and is willing to have a discussion pending results of the 2D echocardiogram which we are actively awaiting results of this morning.  Had a long discussion with the patient and his family regards to treatment strategy and interventional versus medical options.   For questions or updates, please contact [facility] Please consult [email or web address] for contact info under Cardiology/STEMI.      Signed, Quay Burow, MD  04/17/2018, 8:46 AM

## 2018-04-17 NOTE — Progress Notes (Signed)
Pt noted to be hypotensive with SBP 70's-80's.  ntg drip paused per orders and cardiologist notified.  Prior to discontinuing ntg pt c/o SOB.  Lung sounds clear with intermittent expiratory wheeze. Pt looked at alaris pump and stated "that's why I'm hurting, I'm only at 1.5, turn me up."  This RN educated patient regarding the effects of nitroglycerin on blood pressure.  Pt had previously denied pain but endorsed SOB.  When asked how long pt had experienced chest pain pt stated "a few hours, and it's in the center of my chest."  EKG obtained and cardiologist to floor to evaluate patient.

## 2018-04-18 ENCOUNTER — Inpatient Hospital Stay (HOSPITAL_COMMUNITY)
Admission: EM | Disposition: A | Discharge status: Home / Self Care | Payer: Self-pay | Source: Home / Self Care | Attending: Internal Medicine

## 2018-04-18 ENCOUNTER — Encounter (HOSPITAL_COMMUNITY): Payer: Self-pay | Admitting: Cardiovascular Disease

## 2018-04-18 DIAGNOSIS — I2510 Atherosclerotic heart disease of native coronary artery without angina pectoris: Secondary | ICD-10-CM

## 2018-04-18 HISTORY — PX: CORONARY ATHERECTOMY: CATH118238

## 2018-04-18 HISTORY — PX: CORONARY STENT INTERVENTION: CATH118234

## 2018-04-18 LAB — CBC
HCT: 27.4 % — ABNORMAL LOW (ref 39.0–52.0)
HEMOGLOBIN: 8.8 g/dL — AB (ref 13.0–17.0)
MCH: 30.3 pg (ref 26.0–34.0)
MCHC: 32.1 g/dL (ref 30.0–36.0)
MCV: 94.5 fL (ref 78.0–100.0)
PLATELETS: 162 10*3/uL (ref 150–400)
RBC: 2.9 MIL/uL — AB (ref 4.22–5.81)
RDW: 14.4 % (ref 11.5–15.5)
WBC: 8 10*3/uL (ref 4.0–10.5)

## 2018-04-18 LAB — BASIC METABOLIC PANEL
ANION GAP: 12 (ref 5–15)
BUN: 24 mg/dL — ABNORMAL HIGH (ref 6–20)
CALCIUM: 7.6 mg/dL — AB (ref 8.9–10.3)
CO2: 29 mmol/L (ref 22–32)
CREATININE: 4.13 mg/dL — AB (ref 0.61–1.24)
Chloride: 93 mmol/L — ABNORMAL LOW (ref 101–111)
GFR, EST AFRICAN AMERICAN: 14 mL/min — AB (ref >60)
GFR, EST NON AFRICAN AMERICAN: 12 mL/min — AB (ref >60)
GLUCOSE: 253 mg/dL — AB (ref 65–99)
Potassium: 4 mmol/L (ref 3.5–5.1)
Sodium: 134 mmol/L — ABNORMAL LOW (ref 135–145)

## 2018-04-18 LAB — GLUCOSE, CAPILLARY
GLUCOSE-CAPILLARY: 182 mg/dL — AB (ref 65–99)
GLUCOSE-CAPILLARY: 167 mg/dL — AB (ref 65–99)
GLUCOSE-CAPILLARY: 197 mg/dL — AB (ref 65–99)
Glucose-Capillary: 202 mg/dL — ABNORMAL HIGH (ref 65–99)

## 2018-04-18 LAB — HEPARIN LEVEL (UNFRACTIONATED): HEPARIN UNFRACTIONATED: 0.32 [IU]/mL (ref 0.30–0.70)

## 2018-04-18 LAB — POCT ACTIVATED CLOTTING TIME: ACTIVATED CLOTTING TIME: 356 s

## 2018-04-18 SURGERY — CORONARY ATHERECTOMY
Anesthesia: LOCAL

## 2018-04-18 MED ORDER — SODIUM CHLORIDE 0.9 % IV SOLN
INTRAVENOUS | Status: AC | PRN
  Administered 2018-04-18: 100 mL/h via INTRAVENOUS

## 2018-04-18 MED ORDER — HEPARIN (PORCINE) IN NACL 2-0.9 UNITS/ML
INTRAMUSCULAR | Status: AC | PRN
  Administered 2018-04-18 (×2): 500 mL via INTRA_ARTERIAL

## 2018-04-18 MED ORDER — LIDOCAINE-PRILOCAINE 2.5-2.5 % EX CREA
1.0000 "application " | TOPICAL_CREAM | CUTANEOUS | Status: DC | PRN
  Filled 2018-04-18: qty 5

## 2018-04-18 MED ORDER — ASPIRIN EC 81 MG PO TBEC
81.0000 mg | DELAYED_RELEASE_TABLET | Freq: Every day | ORAL | Status: DC
  Administered 2018-04-19: 81 mg via ORAL
  Filled 2018-04-18: qty 1

## 2018-04-18 MED ORDER — LIDOCAINE HCL (PF) 1 % IJ SOLN: 5.0000 mL | INTRAMUSCULAR | Status: DC | PRN

## 2018-04-18 MED ORDER — IOPAMIDOL (ISOVUE-370) INJECTION 76%
INTRAVENOUS | Status: AC
  Filled 2018-04-18: qty 125

## 2018-04-18 MED ORDER — MIDODRINE HCL 5 MG PO TABS
ORAL_TABLET | ORAL | Status: AC
  Administered 2018-04-18: 10 mg via ORAL
  Filled 2018-04-18: qty 2

## 2018-04-18 MED ORDER — NITROGLYCERIN IN D5W 200-5 MCG/ML-% IV SOLN
0.0000 ug/min | INTRAVENOUS | Status: DC
  Administered 2018-04-18: 10 ug/min via INTRAVENOUS

## 2018-04-18 MED ORDER — HYDRALAZINE HCL 20 MG/ML IJ SOLN: 5.0000 mg | INTRAMUSCULAR | Status: AC | PRN

## 2018-04-18 MED ORDER — METHYLPREDNISOLONE SODIUM SUCC 125 MG IJ SOLR
125.0000 mg | Freq: Once | INTRAMUSCULAR | Status: AC
  Administered 2018-04-18: 125 mg via INTRAVENOUS

## 2018-04-18 MED ORDER — SODIUM CHLORIDE 0.9 % IV SOLN: 250.0000 mL | INTRAVENOUS | Status: DC | PRN

## 2018-04-18 MED ORDER — ASPIRIN 81 MG PO CHEW
81.0000 mg | CHEWABLE_TABLET | ORAL | Status: AC
  Administered 2018-04-18: 81 mg via ORAL
  Filled 2018-04-18: qty 1

## 2018-04-18 MED ORDER — SODIUM CHLORIDE 0.9% FLUSH
3.0000 mL | Freq: Two times a day (BID) | INTRAVENOUS | Status: DC
  Administered 2018-04-19: 3 mL via INTRAVENOUS

## 2018-04-18 MED ORDER — LEVALBUTEROL HCL 0.63 MG/3ML IN NEBU
0.6300 mg | INHALATION_SOLUTION | Freq: Four times a day (QID) | RESPIRATORY_TRACT | Status: DC
  Administered 2018-04-18 (×2): 0.63 mg via RESPIRATORY_TRACT
  Filled 2018-04-18 (×2): qty 3

## 2018-04-18 MED ORDER — BIVALIRUDIN TRIFLUOROACETATE 250 MG IV SOLR
INTRAVENOUS | Status: AC
  Filled 2018-04-18: qty 250

## 2018-04-18 MED ORDER — ALBUMIN HUMAN 25 % IV SOLN
25.0000 g | Freq: Once | INTRAVENOUS | Status: AC
  Administered 2018-04-18: 25 g via INTRAVENOUS
  Filled 2018-04-18: qty 100

## 2018-04-18 MED ORDER — ALTEPLASE 2 MG IJ SOLR: 2.0000 mg | Freq: Once | INTRAMUSCULAR | Status: DC | PRN

## 2018-04-18 MED ORDER — VIPERSLIDE LUBRICANT OPTIME
TOPICAL | Status: DC | PRN
  Administered 2018-04-18: 20 mL via SURGICAL_CAVITY

## 2018-04-18 MED ORDER — LIDOCAINE HCL (PF) 1 % IJ SOLN
5.0000 mL | INTRAMUSCULAR | Status: DC | PRN
Start: 2018-04-18 — End: 2018-04-18

## 2018-04-18 MED ORDER — SODIUM CHLORIDE 0.9 % IV SOLN: INTRAVENOUS | Status: DC

## 2018-04-18 MED ORDER — FENTANYL CITRATE (PF) 100 MCG/2ML IJ SOLN
12.5000 ug | Freq: Once | INTRAMUSCULAR | Status: AC
  Administered 2018-04-18: 12.5 ug via INTRAVENOUS
  Filled 2018-04-18: qty 2

## 2018-04-18 MED ORDER — NITROGLYCERIN 1 MG/10 ML FOR IR/CATH LAB
INTRA_ARTERIAL | Status: AC
  Filled 2018-04-18: qty 10

## 2018-04-18 MED ORDER — NOREPINEPHRINE BITARTRATE 1 MG/ML IV SOLN
INTRAVENOUS | Status: DC | PRN
  Administered 2018-04-18: 10 ug/min via INTRAVENOUS

## 2018-04-18 MED ORDER — CLOPIDOGREL BISULFATE 300 MG PO TABS
600.0000 mg | ORAL_TABLET | Freq: Once | ORAL | Status: AC
  Administered 2018-04-18: 600 mg via ORAL
  Filled 2018-04-18: qty 2

## 2018-04-18 MED ORDER — SODIUM CHLORIDE 0.9 % IV SOLN: 100.0000 mL | INTRAVENOUS | Status: DC | PRN

## 2018-04-18 MED ORDER — ATROPINE SULFATE 1 MG/10ML IJ SOSY
PREFILLED_SYRINGE | INTRAMUSCULAR | Status: AC
  Filled 2018-04-18: qty 10

## 2018-04-18 MED ORDER — HEPARIN SODIUM (PORCINE) 1000 UNIT/ML DIALYSIS: 1000.0000 [IU] | INTRAMUSCULAR | Status: DC | PRN

## 2018-04-18 MED ORDER — LEVALBUTEROL HCL 0.63 MG/3ML IN NEBU: 0.6300 mg | INHALATION_SOLUTION | RESPIRATORY_TRACT | Status: DC | PRN

## 2018-04-18 MED ORDER — LORAZEPAM 0.5 MG PO TABS
0.2500 mg | ORAL_TABLET | Freq: Once | ORAL | Status: AC | PRN
  Administered 2018-04-18: 0.25 mg via ORAL
  Filled 2018-04-18: qty 1

## 2018-04-18 MED ORDER — SODIUM CHLORIDE 0.9% FLUSH: 3.0000 mL | INTRAVENOUS | Status: DC | PRN

## 2018-04-18 MED ORDER — METHYLPREDNISOLONE SODIUM SUCC 125 MG IJ SOLR
INTRAMUSCULAR | Status: AC
  Filled 2018-04-18: qty 2

## 2018-04-18 MED ORDER — LIDOCAINE HCL (PF) 1 % IJ SOLN
INTRAMUSCULAR | Status: AC
  Filled 2018-04-18: qty 30

## 2018-04-18 MED ORDER — NOREPINEPHRINE 4 MG/250ML-% IV SOLN
INTRAVENOUS | Status: AC
  Filled 2018-04-18: qty 250

## 2018-04-18 MED ORDER — MIDODRINE HCL 5 MG PO TABS
10.0000 mg | ORAL_TABLET | Freq: Once | ORAL | Status: AC
  Administered 2018-04-18: 10 mg via ORAL

## 2018-04-18 MED ORDER — HEPARIN (PORCINE) IN NACL 1000-0.9 UT/500ML-% IV SOLN
INTRAVENOUS | Status: AC
  Filled 2018-04-18: qty 1000

## 2018-04-18 MED ORDER — FENTANYL CITRATE (PF) 100 MCG/2ML IJ SOLN
INTRAMUSCULAR | Status: DC | PRN
  Administered 2018-04-18 (×3): 25 ug via INTRAVENOUS

## 2018-04-18 MED ORDER — ALBUMIN HUMAN 25 % IV SOLN
INTRAVENOUS | Status: AC
  Administered 2018-04-18: 25 g via INTRAVENOUS
  Filled 2018-04-18: qty 100

## 2018-04-18 MED ORDER — MIDAZOLAM HCL 2 MG/2ML IJ SOLN
INTRAMUSCULAR | Status: AC
  Filled 2018-04-18: qty 2

## 2018-04-18 MED ORDER — HEPARIN SODIUM (PORCINE) 1000 UNIT/ML DIALYSIS
1000.0000 [IU] | INTRAMUSCULAR | Status: DC | PRN
Start: 2018-04-18 — End: 2018-04-18

## 2018-04-18 MED ORDER — LIDOCAINE-PRILOCAINE 2.5-2.5 % EX CREA
1.0000 | TOPICAL_CREAM | CUTANEOUS | Status: DC | PRN
Start: 2018-04-18 — End: 2018-04-18

## 2018-04-18 MED ORDER — SODIUM CHLORIDE 0.9% FLUSH: 3.0000 mL | Freq: Two times a day (BID) | INTRAVENOUS | Status: DC

## 2018-04-18 MED ORDER — PENTAFLUOROPROP-TETRAFLUOROETH EX AERO
1.0000 | INHALATION_SPRAY | CUTANEOUS | Status: DC | PRN
Start: 2018-04-18 — End: 2018-04-18

## 2018-04-18 MED ORDER — CLOPIDOGREL BISULFATE 75 MG PO TABS
75.0000 mg | ORAL_TABLET | Freq: Every day | ORAL | Status: DC
  Administered 2018-04-19: 75 mg via ORAL
  Filled 2018-04-18: qty 1

## 2018-04-18 MED ORDER — IOPAMIDOL (ISOVUE-370) INJECTION 76%
INTRAVENOUS | Status: AC
  Filled 2018-04-18: qty 100

## 2018-04-18 MED ORDER — IOPAMIDOL (ISOVUE-370) INJECTION 76%
INTRAVENOUS | Status: AC
  Filled 2018-04-18: qty 50

## 2018-04-18 MED ORDER — PENTAFLUOROPROP-TETRAFLUOROETH EX AERO
1.0000 "application " | INHALATION_SPRAY | CUTANEOUS | Status: DC | PRN
  Filled 2018-04-18: qty 103.5

## 2018-04-18 MED ORDER — BIVALIRUDIN BOLUS VIA INFUSION - CUPID
INTRAVENOUS | Status: DC | PRN
  Administered 2018-04-18: 83.4 mg via INTRAVENOUS

## 2018-04-18 MED ORDER — IPRATROPIUM BROMIDE 0.02 % IN SOLN
0.5000 mg | Freq: Four times a day (QID) | RESPIRATORY_TRACT | Status: DC
  Administered 2018-04-18 (×2): 0.5 mg via RESPIRATORY_TRACT
  Filled 2018-04-18 (×2): qty 2.5

## 2018-04-18 MED ORDER — VIPERSLIDE LUBRICANT OPTIME
TOPICAL | Status: DC | PRN
  Administered 2018-04-18: 12:00:00 via SURGICAL_CAVITY

## 2018-04-18 MED ORDER — NITROGLYCERIN 1 MG/10 ML FOR IR/CATH LAB
INTRA_ARTERIAL | Status: DC | PRN
  Administered 2018-04-18 (×3): 100 ug via INTRACORONARY

## 2018-04-18 MED ORDER — FENTANYL CITRATE (PF) 100 MCG/2ML IJ SOLN
INTRAMUSCULAR | Status: AC
  Filled 2018-04-18: qty 2

## 2018-04-18 MED ORDER — MIDAZOLAM HCL 2 MG/2ML IJ SOLN
INTRAMUSCULAR | Status: DC | PRN
  Administered 2018-04-18: 1 mg via INTRAVENOUS

## 2018-04-18 MED ORDER — IOPAMIDOL (ISOVUE-370) INJECTION 76%
INTRAVENOUS | Status: DC | PRN
  Administered 2018-04-18: 215 mL via INTRA_ARTERIAL

## 2018-04-18 MED ORDER — SODIUM CHLORIDE 0.9 % IV SOLN
INTRAVENOUS | Status: DC | PRN
  Administered 2018-04-18 (×2): 1 mg/kg/h via INTRAVENOUS

## 2018-04-18 MED ORDER — CLOPIDOGREL BISULFATE 75 MG PO TABS
75.0000 mg | ORAL_TABLET | Freq: Every day | ORAL | Status: DC
Start: 2018-04-19 — End: 2018-04-18

## 2018-04-18 MED ORDER — METHYLPREDNISOLONE SODIUM SUCC 125 MG IJ SOLR
60.0000 mg | Freq: Two times a day (BID) | INTRAMUSCULAR | Status: DC
  Administered 2018-04-18 – 2018-04-19 (×2): 60 mg via INTRAVENOUS
  Filled 2018-04-18 (×2): qty 2

## 2018-04-18 MED ORDER — LIDOCAINE HCL (PF) 1 % IJ SOLN
INTRAMUSCULAR | Status: DC | PRN
  Administered 2018-04-18: 15 mL

## 2018-04-18 MED ORDER — LABETALOL HCL 5 MG/ML IV SOLN: 10.0000 mg | INTRAVENOUS | Status: AC | PRN

## 2018-04-18 SURGICAL SUPPLY — 27 items
BALLN SAPPHIRE 2.5X12 (BALLOONS) ×2
BALLN SAPPHIRE 2.5X20 (BALLOONS) ×2
BALLN SAPPHIRE ~~LOC~~ 3.0X15 (BALLOONS) ×1 IMPLANT
BALLN SAPPHIRE ~~LOC~~ 3.25X10 (BALLOONS) ×1 IMPLANT
BALLN SAPPHIRE ~~LOC~~ 3.5X15 (BALLOONS) ×1 IMPLANT
BALLOON SAPPHIRE 2.5X12 (BALLOONS) IMPLANT
BALLOON SAPPHIRE 2.5X20 (BALLOONS) IMPLANT
CATH TELEPORT (CATHETERS) ×1 IMPLANT
CATH VISTA GUIDE 7FRXB LAD 3.5 (CATHETERS) ×1 IMPLANT
COVER PRB 48X5XTLSCP FOLD TPE (BAG) IMPLANT
COVER PROBE 5X48 (BAG) ×2
CROWN DIAMONDBACK CLASSIC 1.25 (BURR) ×1 IMPLANT
ELECT DEFIB PAD ADLT CADENCE (PAD) ×1 IMPLANT
KIT ENCORE 26 ADVANTAGE (KITS) ×1 IMPLANT
KIT HEART LEFT (KITS) ×2 IMPLANT
KIT HEMO VALVE WATCHDOG (MISCELLANEOUS) ×1 IMPLANT
LUBRICANT VIPERSLIDE CORONARY (MISCELLANEOUS) ×1 IMPLANT
PACK CARDIAC CATHETERIZATION (CUSTOM PROCEDURE TRAY) ×2 IMPLANT
SHEATH AVANTI 11CM 7FR (SHEATH) ×1 IMPLANT
STENT SYNERGY DES 3X16 (Permanent Stent) ×1 IMPLANT
STENT SYNERGY DES 3X38 (Permanent Stent) ×1 IMPLANT
TRANSDUCER W/STOPCOCK (MISCELLANEOUS) ×2 IMPLANT
TUBING CIL FLEX 10 FLL-RA (TUBING) ×2 IMPLANT
WIRE COUGAR XT STRL 300CM (WIRE) ×2 IMPLANT
WIRE EMERALD 3MM-J .035X150CM (WIRE) ×1 IMPLANT
WIRE HI TORQ WHISPER MS 300CM (WIRE) ×1 IMPLANT
WIRE VIPER ADVANCE COR .012TIP (WIRE) ×1 IMPLANT

## 2018-04-18 NOTE — Interval H&P Note (Signed)
History and Physical Interval Note:  04/18/2018 9:52 AM  Jeffrey [name]  has presented today for PCI with the diagnosis of NSTEMI/advanced severe CAD. The various methods of treatment have been discussed with the patient and family. After consideration of risks, benefits and other options for treatment, the patient has consented to  Procedure(s): CORONARY ATHERECTOMY (N/A) as a surgical intervention .  The patient's history has been reviewed, patient examined, no change in status, stable for surgery.  I have reviewed the patient's chart and labs.  Questions were answered to the patient's satisfaction.    Cath Lab Visit (complete for each Cath Lab visit)  Clinical Evaluation Leading to the Procedure:   ACS: Yes.    Non-ACS:    Anginal Classification: CCS IV  Anti-ischemic medical therapy: Minimal Therapy (1 class of medications)  Non-Invasive Test Results: No non-invasive testing performed  Prior CABG: No previous CABG         Lauree Chandler

## 2018-04-18 NOTE — Care Management Important Message (Signed)
Important Message  Patient Details  Name: Jeffrey [name] MRN: [id number] Date of Birth: 12/31/1940   Medicare Important Message Given:  Yes    Barb Merino Ruchel Brandenburger 04/18/2018, 3:18 PM

## 2018-04-18 NOTE — Progress Notes (Signed)
Jeffrey [name] is admitted with chest pain and a NSTEMI. He has severe triple vessel CAD. He has undergone TAVR in 2016 due to being a poor candidate for open heart surgery and surgical AVR. He now has been found to have progression of his CAD by cath earlier this week. Dr. Claiborne Billings performed his cath this week. Dr. Roxy Manns saw him to discuss possible CABG but he was not felt to be a candidate for CABG given advanced age and comorbidities. I have been asked to perform PCI of the LAD and Circumflex with orbital atherectomy and stenting. This is a very high risk procedure given the ostial nature of the Circumflex stenosis. I have quoted the pt and his family a 10% intra-procedure mortality risk. He is in full agreement to proceed. His family wishes to proceed.   Lauree Chandler 04/18/2018 9:51 AM

## 2018-04-18 NOTE — Progress Notes (Signed)
ANTICOAGULATION CONSULT NOTE  Pharmacy Consult:  Heparin Indication: chest pain/ACS  Allergies  Allergen Reactions  . Byetta 10 Mcg Pen [Exenatide] Diarrhea and Nausea And Vomiting  . Codeine Itching  . Coumadin [Warfarin Sodium] Rash    Patient Measurements: Height: 5\' 11"  (180.3 cm) Weight: 245 lb 2.4 oz (111.2 kg) IBW/kg (Calculated) : 75.3 Heparin Dosing Weight: 99  kg  Vital Signs: Temp: 98.1 F (36.7 C) (05/24 0332) Temp Source: Oral (05/24 0332) BP: 135/108 (05/24 0636) Pulse Rate: 57 (05/24 0114)  Labs: Recent Labs    04/16/18 0212  04/17/18 0219 04/17/18 1207 04/18/18 0310  HGB 9.5*  --  9.0*  --   --   HCT 28.6*  --  27.6*  --   --   PLT 162  --  155  --   --   HEPARINUNFRC 0.19*   < > 0.25* 0.22* 0.32  CREATININE  --   --   --   --  4.13*   < > = values in this interval not displayed.    Estimated Creatinine Clearance: 18.4 mL/min (A) (by C-G formula based on SCr of 4.13 mg/dL (H)).  Assessment: 30 YOM started on heparin for ACS. 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  Heparin level this morning was 0.32, on 1700 units/hr. No issues with infusion per conversation with nursing. Some oozing of blood at IV site - likely related to location near blood pressure cuff. Hgb was 9, plt 155 yesterday.   Goal of Therapy:  Heparin level 0.3-0.5 units/ml - given bleeding Monitor platelets by anticoagulation protocol: Yes   Plan:  Continue heparin infusion at 1700 units/hr Daily heparin level and CBC Follow up duration of heparin infusion pending cardiac plan  Doylene Canard, PharmD Clinical Pharmacist  Pager: [phone] Phone: [phone] 04/18/2018 7:18 AM

## 2018-04-18 NOTE — Progress Notes (Signed)
Physician notified: Angelena Form in procedure in cath. At: 1354  Regarding: called by RT with pt new CP 9/10. RN to bedside, pt receiving neb. Pt states pain is 5//10 and dozing off to sleep when not stimulated. Continues to have auditory exp wheeze s/p neb and solumedrol. Awaiting return response.   Returned Response at: 1400--Berry At bedside. Luke PA on the way.  Order(s): get 12 lead. Restart NTG at 16mcg up to 5 mcg.

## 2018-04-18 NOTE — Progress Notes (Signed)
Per pt he was having chest pain scale of 9. RT notified RN. RN at the pts bedside

## 2018-04-18 NOTE — Progress Notes (Signed)
[facility] NEPHROLOGY PROGRESS NOTE  Assessment/ Plan: Pt is a 77 y.o. 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  Status post cardiac catheter with three-vessel disease. Dialysis Orders: Center: [facility]   on mwf . EDW 106kg HD Bath 2k, 3.5 ca  Time 4hr Heparin NONE. Access LUA AVF  Hec 2 mcg IV/HD Mircera 60 mcg  q 2wks ( last on 04/02/18)  Assessment/Plan:  #Chest pain/NSTEMI: Cath showed three-vessel disease.  High risk for CT surgery.  Echocardiogram with EF of 45 to 50%.  Patient with continuous pain requiring titration of nitro and subsequent hypotension.  Plan for cardiac cath today per cardiology.    # ESRD:MWF, received dialysis yesterday off schedule.  Plan to do dialysis after the catheter today to resume his schedule.  Volume status acceptable, serum potassium level 4.  I have discussed with the patient's family members today.    # Anemia: Hemoglobin 8.8.  Continue Aranesp.  # Secondary hyperparathyroidism: Continue Sensipar, Hectorol  # HTN/volume: Blood pressure low today because of up titration of nitro.  Monitor blood pressure.     Subjective: Seen and examined at bedside.  Overnight event noted, on nitro for chest pain.  Going for cardiac cath.  No shortness of breath.  Denied nausea vomiting.  Family members at bedside.  Objective Vital signs in last 24 hours: Vitals:   04/18/18 0636 04/18/18 0733 04/18/18 0800 04/18/18 0830  BP: (!) 135/108 (!) 100/56 (!) 83/40 (!) 88/53  Pulse:  93 88 98  Resp: 15 18 16 18   Temp:  98.3 F (36.8 C)    TempSrc:  Oral    SpO2:  100% 93% 97%  Weight:      Height:       Weight change:   Intake/Output Summary (Last 24 hours) at 04/18/2018 0950 Last data filed at 04/18/2018 0904 Gross per 24 hour  Intake 849.68 ml  Output 1303 ml  Net -453.32 ml       Labs: Basic Metabolic Panel: Recent Labs  Lab 04/14/18 0230 04/15/18 0311 04/18/18 0310  NA 134* 136 134*   K 4.5 4.7 4.0  CL 97* 98* 93*  CO2 22 23 29   GLUCOSE 233* 162* 253*  BUN 61* 68* 24*  CREATININE 6.59* 7.11* 4.13*  CALCIUM 8.8* 8.5* 7.6*   Liver Function Tests: No results for input(s): AST, ALT, ALKPHOS, BILITOT, PROT, ALBUMIN in the last 168 hours. No results for input(s): LIPASE, AMYLASE in the last 168 hours. No results for input(s): AMMONIA in the last 168 hours. CBC: Recent Labs  Lab 04/14/18 0230 04/15/18 0311 04/16/18 0212 04/17/18 0219 04/18/18 0644  WBC 7.7 10.2 7.3 8.1 8.0  HGB 10.0* 10.3* 9.5* 9.0* 8.8*  HCT 31.0* 31.1* 28.6* 27.6* 27.4*  MCV 94.5 93.4 94.7 93.9 94.5  PLT 174 175 162 155 162   Cardiac Enzymes: Recent Labs  Lab 04/14/18 1003 04/14/18 1714 04/14/18 2346  TROPONINI 0.36* 0.53* 0.47*   CBG: Recent Labs  Lab 04/17/18 0808 04/17/18 1231 04/17/18 1826 04/17/18 2052 04/18/18 0741  GLUCAP 243* 197* 82 142* 182*    Iron Studies: No results for input(s): IRON, TIBC, TRANSFERRIN, FERRITIN in the last 72 hours. Studies/Results: No results found.  Medications: Infusions: . sodium chloride    . sodium chloride    . sodium chloride    . sodium chloride    . heparin 1,700 Units/hr (04/18/18 0904)  . nitroGLYCERIN 20  mcg/min (04/18/18 7482)    Scheduled Medications: . [START ON 04/19/2018] aspirin EC  81 mg Oral Daily  . atorvastatin  80 mg Oral q1800  . Chlorhexidine Gluconate Cloth  6 each Topical Q0600  . cinacalcet  30 mg Oral Q supper  . darbepoetin (ARANESP) injection - DIALYSIS  60 mcg Intravenous Q Thu-HD  . doxercalciferol  2 mcg Intravenous Q M,W,F-HD  . famotidine  20 mg Oral Daily  . insulin aspart  0-15 Units Subcutaneous TID WC  . insulin aspart  0-5 Units Subcutaneous QHS  . insulin aspart  4 Units Subcutaneous TID WC  . insulin detemir  50 Units Subcutaneous QHS  . metoprolol tartrate  25 mg Oral BID  . sodium chloride flush  3 mL Intravenous Q12H  . sodium chloride flush  3 mL Intravenous Q12H  . sodium chloride  flush  3 mL Intravenous Q12H  . triamcinolone 0.1 % cream : eucerin   Topical TID    have reviewed scheduled and prn medications.  Physical Exam: General: Lying on bed, not in distress Heart: Regular rate rhythm S1-S2 normal Lungs: Clear bilateral, no crackles Abdomen:soft, Non-tender, non-distended Extremities: No edema. Dialysis Access: Left AV fistula  Dron Prasad Bhandari 04/18/2018,9:50 AM  LOS: 3 days

## 2018-04-18 NOTE — Progress Notes (Signed)
MD notified for continued CP and difficulty titrating nitroglycerin for CP d/t hypotension.  Order received for 1 time dose fentanyl.  IV fentanyl given per order.  Pt states "a little better, but still a lot of pain."  BP 118/58, will continue to titrate nitroglycerin as blood pressure allows.

## 2018-04-18 NOTE — Progress Notes (Addendum)
[facility]                                                                              Patient Demographics  Nettie Cromwell, is a 77 y.o. male, DOB - Apr 08, 1941, LGX:[id number]  Admit date - 04/14/2018   Admitting Physician Rise Patience, MD  Outpatient Primary MD for the patient is [name], Dianna Rossetti, MD  Outpatient specialists:   LOS - 3  days   Medical records reviewed and are as summarized below:    Chief Complaint  Patient presents with  . Chest Pain       Brief summary   77 year old Caucasian male with a past medical history of diabetes, aortic stenosis status post TAVR, pacemaker placement, peripheral vascular disease hypertension, end-stage renal disease on hemodialysis, history of stroke, coronary artery disease presented with chest pain.  He was seen by cardiology.  He underwent cardiac catheterization which showed multivessel disease.  Cardiothoracic surgery was consulted by cardiology.  Assessment & Plan    Principal Problem:   NSTEMI (non-ST elevated myocardial infarction) ([place]) underlying known history of CAD -Cardiology was consulted.  Cardiac cath 5/20 showed multivessel coronary artery disease including total chronic occlusion of the mid RCA with left-to-right collaterals, 75% calcified mid LAD and 95% calcified ostial circumflex coronary artery.  No intervention done during the cardiac cath. -Cardiothoracic surgery was consulted, seen by Dr. Roxy Manns, felt high risk for surgical revascularization,  -2D echo showed EF of 45 to 50% with regional wall motion abnormalities -Underwent cardiac cath, showed severe stenosis proximal to mid LAD, status post successful PCI.  Severe stenosis of ostial circumflex artery status post PCI -Post-cath, acute wheezing, given Solu-Medrol 125 mg IV x1 with duo nebs  Active Problems:   Type 2 diabetes mellitus with chronic kidney disease on chronic dialysis, with long-term current use of insulin (HCC) -CBGs  elevated, 243 this morning -CBGs likely to increase due to Solu-Medrol, continue Levemir, NovoLog meal coverage, sliding scale insulin    essential hypertension -BP currently stable    ESRD (end stage renal disease) on dialysis (HCC) - HD MWF schedule, renal following  Chronic atrial fibrillation and history of heart block, status post pacemaker  -Defer to cardiology, not on anticoagulation  Rash -Unclear etiology, diffuse macular papular, continue topical agents for now, outpatient dermatology evaluation  History of aortic stenosis S/P TAVR (transcatheter aortic valve replacement) -No acute issues  Chronic systolic CHF -2D echo showed EF of 45 to 50%   Code Status: Full CODE STATUS DVT Prophylaxis: Heparin Family Communication: Discussed in detail with the patient, all imaging results, lab results explained to the patient, discussed with patient's son at the bedside  Disposition Plan: Status post cath today, will follow cards recommendations  Time Spent in minutes 25 minutes  Procedures:  Cardiac cath 5/20  Ost Cx lesion is 95% stenosed.  Prox LAD-1 lesion is 60% stenosed.  Prox LAD-2 lesion is 70% stenosed.  Prox RCA to Dist RCA lesion is 100% stenosed.  Prox Cx to Mid Cx lesion is 30% stenosed.  Ost 2nd Diag lesion is 70% stenosed. Significant multivessel coronary calcification with 3 vessel CAD,  and 60% proximal LAD stenosis, 70% calcified stenosis between the first and second diagonal vessel; dominant left circumflex coronary artery with new 90-95% ostial stenosis and diffuse 30% mid stenosis; total chronic mid RCA occlusion. There is left to right collateralization to the distal RCA.  Consultants:   Cardiology CT VS Nephrology  Antimicrobials:      Medications  Scheduled Meds: . [START ON 04/19/2018] aspirin EC  81 mg Oral Daily  . atorvastatin  80 mg Oral q1800  . Chlorhexidine Gluconate Cloth  6 each Topical Q0600  . cinacalcet  30 mg Oral Q  supper  . [START ON 04/19/2018] clopidogrel  75 mg Oral Daily  . darbepoetin (ARANESP) injection - DIALYSIS  60 mcg Intravenous Q Thu-HD  . doxercalciferol  2 mcg Intravenous Q M,W,F-HD  . famotidine  20 mg Oral Daily  . insulin aspart  0-15 Units Subcutaneous TID WC  . insulin aspart  0-5 Units Subcutaneous QHS  . insulin aspart  4 Units Subcutaneous TID WC  . insulin detemir  50 Units Subcutaneous QHS  . levalbuterol  0.63 mg Nebulization Q6H   And  . ipratropium  0.5 mg Nebulization Q6H  . methylPREDNISolone (SOLU-MEDROL) injection  60 mg Intravenous Q12H  . methylPREDNISolone sodium succinate      . metoprolol tartrate  25 mg Oral BID  . sodium chloride flush  3 mL Intravenous Q12H  . sodium chloride flush  3 mL Intravenous Q12H  . sodium chloride flush  3 mL Intravenous Q12H  . triamcinolone 0.1 % cream : eucerin   Topical TID   Continuous Infusions: . sodium chloride    . sodium chloride    . sodium chloride     PRN Meds:.sodium chloride, sodium chloride, sodium chloride, acetaminophen, calcium carbonate (dosed in mg elemental calcium), camphor-menthol **AND** hydrOXYzine, docusate sodium, feeding supplement (NEPRO CARB STEADY), hydrALAZINE, labetalol, levalbuterol, ondansetron (ZOFRAN) IV, sodium chloride flush, sodium chloride flush, sodium chloride flush, sorbitol, zolpidem   Antibiotics   Anti-infectives (From admission, onward)   None        Subjective:   Cassius Cullinane was seen and examined today.  Had a rough night with chest pain, family at the bedside.  Patient denies dizziness, abdominal pain, N/V/D/C, new weakness, numbess, tingling.    Objective:   Vitals:   04/18/18 0830 04/18/18 1004 04/18/18 1312 04/18/18 1354  BP: (!) 88/53  117/78   Pulse: 98  (!) 110   Resp: 18  17   Temp:   98.1 F (36.7 C)   TempSrc:   Oral   SpO2: 97% 95% 97% 99%  Weight:      Height:        Intake/Output Summary (Last 24 hours) at 04/18/2018 1413 Last data filed at  04/18/2018 1000 Gross per 24 hour  Intake 871.15 ml  Output 1303 ml  Net -431.85 ml     Wt Readings from Last 3 Encounters:  04/17/18 111.2 kg (245 lb 2.4 oz)  02/18/18 109.3 kg (241 lb)  11/14/17 107.5 kg (237 lb)     Exam    General: Sleepy and arousable, NAD  Eyes:   HEENT:  Atraumatic, normocephalic  Cardiovascular: S1 S2 auscultated,Regular rate and rhythm. No pedal edema b/l  Respiratory: Decreased breath sound at the bases  Gastrointestinal: Soft, nontender, nondistended, + bowel sounds  Ext: no pedal edema bilaterally  Neuro no new deficit  Musculoskeletal: No digital cyanosis, clubbing  Skin: No rashes  Psych: sleepy  Data Reviewed:  I  have personally reviewed following labs and imaging studies  Micro Results Recent Results (from the past 240 hour(s))  MRSA PCR Screening     Status: None   Collection Time: 04/14/18  6:01 PM  Result Value Ref Range Status   MRSA by PCR NEGATIVE NEGATIVE Final    Comment:        The GeneXpert MRSA Assay (FDA approved for NASAL specimens only), is one component of a comprehensive MRSA colonization surveillance program. It is not intended to diagnose MRSA infection nor to guide or monitor treatment for MRSA infections. Performed at [facility], [place] [address], [place], [place] [address]     Radiology Reports Dg Chest 2 View  Result Date: 04/14/2018 CLINICAL DATA:  Chest pain EXAM: CHEST - 2 VIEW COMPARISON:  11/14/2017 FINDINGS: Right-sided pacing device as before, incompletely visualized ventricular leads. Cardiac valve. Incomplete inclusion of the lung bases. Hyperinflation. No pleural effusion. Cardiomegaly. Aortic atherosclerosis. No pneumothorax. IMPRESSION: No active cardiopulmonary disease.  Cardiomegaly Electronically Signed   By: Donavan Foil M.D.   On: 04/14/2018 03:17   Dg Chest Port 1 View  Result Date: 04/15/2018 CLINICAL DATA:  Cough and shortness of breath EXAM: PORTABLE CHEST 1 VIEW  COMPARISON:  Apr 14, 2018 FINDINGS: There is no appreciable edema or consolidation. There is cardiomegaly with pulmonary vascularity within normal limits. Pacemaker leads are attached to the right atrium and right ventricle. There is aortic atherosclerosis. No evident adenopathy. No bone lesions. IMPRESSION: Stable cardiac prominence. Aortic atherosclerosis. Pacemaker leads attached to right atrium and right ventricle. No edema or consolidation. Aortic Atherosclerosis (ICD10-I70.0). Electronically Signed   By: Lowella Grip III M.D.   On: 04/15/2018 14:44    Lab Data:  CBC: Recent Labs  Lab 04/14/18 0230 04/15/18 0311 04/16/18 0212 04/17/18 0219 04/18/18 0644  WBC 7.7 10.2 7.3 8.1 8.0  HGB 10.0* 10.3* 9.5* 9.0* 8.8*  HCT 31.0* 31.1* 28.6* 27.6* 27.4*  MCV 94.5 93.4 94.7 93.9 94.5  PLT 174 175 162 155 322   Basic Metabolic Panel: Recent Labs  Lab 04/14/18 0230 04/15/18 0311 04/18/18 0310  NA 134* 136 134*  K 4.5 4.7 4.0  CL 97* 98* 93*  CO2 22 23 29   GLUCOSE 233* 162* 253*  BUN 61* 68* 24*  CREATININE 6.59* 7.11* 4.13*  CALCIUM 8.8* 8.5* 7.6*   GFR: Estimated Creatinine Clearance: 18.4 mL/min (A) (by C-G formula based on SCr of 4.13 mg/dL (H)). Liver Function Tests: No results for input(s): AST, ALT, ALKPHOS, BILITOT, PROT, ALBUMIN in the last 168 hours. No results for input(s): LIPASE, AMYLASE in the last 168 hours. No results for input(s): AMMONIA in the last 168 hours. Coagulation Profile: No results for input(s): INR, PROTIME in the last 168 hours. Cardiac Enzymes: Recent Labs  Lab 04/14/18 1003 04/14/18 1714 04/14/18 2346  TROPONINI 0.36* 0.53* 0.47*   BNP (last 3 results) No results for input(s): PROBNP in the last 8760 hours. HbA1C: No results for input(s): HGBA1C in the last 72 hours. CBG: Recent Labs  Lab 04/17/18 1231 04/17/18 1826 04/17/18 2052 04/18/18 0741 04/18/18 1317  GLUCAP 197* 82 142* 182* 167*   Lipid Profile: No results for  input(s): CHOL, HDL, LDLCALC, TRIG, CHOLHDL, LDLDIRECT in the last 72 hours. Thyroid Function Tests: No results for input(s): TSH, T4TOTAL, FREET4, T3FREE, THYROIDAB in the last 72 hours. Anemia Panel: No results for input(s): VITAMINB12, FOLATE, FERRITIN, TIBC, IRON, RETICCTPCT in the last 72 hours. Urine analysis:    Component Value  Date/Time   COLORURINE YELLOW 06/17/2017 [place] 06/17/2017 0547   LABSPEC 1.010 06/17/2017 0547   PHURINE 6.0 06/17/2017 0547   GLUCOSEU >=500 (A) 06/17/2017 0547   HGBUR SMALL (A) 06/17/2017 0547   BILIRUBINUR NEGATIVE 06/17/2017 0547   KETONESUR NEGATIVE 06/17/2017 0547   PROTEINUR 100 (A) 06/17/2017 0547   UROBILINOGEN 0.2 09/28/2014 2257   NITRITE NEGATIVE 06/17/2017 0547   LEUKOCYTESUR TRACE (A) 06/17/2017 0547     Ripudeep Rai M.D. [facility] 04/18/2018, 2:13 PM  Pager: [phone] Between 7am to 7pm - call Pager - [phone]  After 7pm go to [email or web address] - password [email or web address]  Call night coverage person covering after 7pm

## 2018-04-18 NOTE — Progress Notes (Addendum)
Return from cath lab. R groin site, level 0. Sheath intact, clean dry intact. VSS. Pt educated on bedrest, bleeding risk and restrictions. Will continue to monitor closely. Family at bedside.    Physician notified: Rai At: 66  Regarding: Pt returned from cath lab with auditory wheezing. No PRN neb orders. HR 105 vpaced.  Awaiting return response.   Returned Response at: 1324  Order(s): placing order for xopenex neb. 125mg  solumedrol IV x 1 dose. Please call RT to give treatment.

## 2018-04-18 NOTE — Progress Notes (Signed)
HD tx initiated via 15Gx2 w/o problem, pull/push/flush well w/o problem, VSS w/ soft bp but received orders for interventions from Dr. Carolin Sicks prior to arrival to pt's room, will cont to monitor while on HD tx

## 2018-04-18 NOTE — H&P (View-Only) (Signed)
Progress Note  Patient Name: Jeffrey [name] Date of Encounter: 04/18/2018  Primary Cardiologist: [name], MD   Subjective   2D echo obtained yesterday revealed an ejection fraction in the 45 to 50% range.  He did have more pain last night requiring titration of his IV nitroglycerin.  He is mildly hypotensive as a result.  We had a long discussion this morning with the patient and his family and have agreed to proceed with high risk percutaneous intervention by Dr. Angelena Form using atherectomy of his LAD and ostial circumflex.  He is not a surgical candidate.  Inpatient Medications    Scheduled Meds: . aspirin  81 mg Oral Pre-Cath  . [START ON 04/19/2018] aspirin EC  81 mg Oral Daily  . atorvastatin  80 mg Oral q1800  . Chlorhexidine Gluconate Cloth  6 each Topical Q0600  . cinacalcet  30 mg Oral Q supper  . darbepoetin (ARANESP) injection - DIALYSIS  60 mcg Intravenous Q Thu-HD  . doxercalciferol  2 mcg Intravenous Q M,W,F-HD  . famotidine  20 mg Oral Daily  . insulin aspart  0-15 Units Subcutaneous TID WC  . insulin aspart  0-5 Units Subcutaneous QHS  . insulin aspart  4 Units Subcutaneous TID WC  . insulin detemir  50 Units Subcutaneous QHS  . metoprolol tartrate  25 mg Oral BID  . sodium chloride flush  3 mL Intravenous Q12H  . sodium chloride flush  3 mL Intravenous Q12H  . sodium chloride flush  3 mL Intravenous Q12H  . triamcinolone 0.1 % cream : eucerin   Topical TID   Continuous Infusions: . sodium chloride    . sodium chloride    . sodium chloride    . sodium chloride    . heparin 1,700 Units/hr (04/17/18 2055)  . nitroGLYCERIN 20 mcg/min (04/18/18 0702)   PRN Meds: sodium chloride, sodium chloride, sodium chloride, acetaminophen, calcium carbonate (dosed in mg elemental calcium), camphor-menthol **AND** hydrOXYzine, docusate sodium, feeding supplement (NEPRO CARB STEADY), ondansetron (ZOFRAN) IV, sodium chloride flush, sodium chloride flush, sodium chloride  flush, sorbitol, zolpidem   Vital Signs    Vitals:   04/18/18 0600 04/18/18 0630 04/18/18 0636 04/18/18 0733  BP: (!) 107/51 (!) 75/38 (!) 135/108 (!) 100/56  Pulse:    93  Resp: 13 16 15 18   Temp:    98.3 F (36.8 C)  TempSrc:    Oral  SpO2:    100%  Weight:      Height:        Intake/Output Summary (Last 24 hours) at 04/18/2018 0858 Last data filed at 04/17/2018 1737 Gross per 24 hour  Intake -  Output 1303 ml  Net -1303 ml   Filed Weights   04/15/18 1210 04/17/18 1317 04/17/18 1737  Weight: 233 lb 4 oz (105.8 kg) 248 lb 0.3 oz (112.5 kg) 245 lb 2.4 oz (111.2 kg)    Telemetry    Paced rhythm - Personally Reviewed  ECG    No new tracings - Personally Reviewed  Physical Exam   GEN: No acute distress.   Neck: No JVD Cardiac: RRR, no murmurs, rubs, or gallops.  Respiratory: Clear to auscultation bilaterally. GI: Soft, nontender, non-distended  MS: No edema; No deformity. Neuro:  Nonfocal  Psych: Normal affect   Labs    Chemistry Recent Labs  Lab 04/14/18 0230 04/15/18 0311 04/18/18 0310  NA 134* 136 134*  K 4.5 4.7 4.0  CL 97* 98* 93*  CO2 22 23 29  GLUCOSE 233* 162* 253*  BUN 61* 68* 24*  CREATININE 6.59* 7.11* 4.13*  CALCIUM 8.8* 8.5* 7.6*  GFRNONAA 7* 6* 12*  GFRAA 8* 8* 14*  ANIONGAP 15 15 12      Hematology Recent Labs  Lab 04/16/18 0212 04/17/18 0219 04/18/18 0644  WBC 7.3 8.1 8.0  RBC 3.02* 2.94* 2.90*  HGB 9.5* 9.0* 8.8*  HCT 28.6* 27.6* 27.4*  MCV 94.7 93.9 94.5  MCH 31.5 30.6 30.3  MCHC 33.2 32.6 32.1  RDW 14.2 14.2 14.4  PLT 162 155 162    Cardiac Enzymes Recent Labs  Lab 04/14/18 1003 04/14/18 1714 04/14/18 2346  TROPONINI 0.36* 0.53* 0.47*    Recent Labs  Lab 04/14/18 0239  TROPIPOC 0.14*     BNPNo results for input(s): BNP, PROBNP in the last 168 hours.   DDimer No results for input(s): DDIMER in the last 168 hours.   Radiology    No results found.  Cardiac Studies   Left heart cath 04/14/18:  Colon Flattery  Cx lesion is 95% stenosed.  Prox LAD-1 lesion is 60% stenosed.  Prox LAD-2 lesion is 70% stenosed.  Prox RCA to Dist RCA lesion is 100% stenosed.  Prox Cx to Mid Cx lesion is 30% stenosed.  Ost 2nd Diag lesion is 70% stenosed.   Significant multivessel coronary calcification with 3 vessel CAD, and 60% proximal LAD stenosis, 70% calcified stenosis between the first and second diagonal vessel; dominant left circumflex coronary artery with new 90-95% ostial stenosis and diffuse 30% mid stenosis; total chronic mid RCA occlusion.  There is left to right collateralization to the distal RCA.  Well-seated TAVR valve.  RECOMMENDATION: With the patient's significant coronary calcification, and significant multivessel CAD with high-grade ostial circumflex stenosis in a large dominant circumflex vessel, calcified LAD disease and left-to-right collateralization to the distal RCA, will ask Dr. Ricard Dillon to reevaluate for consideration of possible CABG revascularization surgery in this patient with a history of prior TAVR.  Patient Profile     77 y.o. 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  Cardiac catheterization revealed a non-chronic total RCA, 75% calcified mid LAD and 95% calcified ostial circumflex with an acute angle of the left main.  He was seen by Dr. Roxy Manns for cardiovascular consultation who felt he was "high risk" for surgical revascularization.  We are awaiting the results of his 2D echocardiogram.   Assessment & Plan    1. NSTEMI, CAD, heart cath 04/14/18 with significant 3v disease including total chronic occlusion of the mid RCA with left-to-right collaterals, 75% calcified mid LAD and 95% calcified ostial circumflex  coronary artery. - no intervention during heart cath - consulted TCTS for possible revascularization .  He was seen by Dr. Roxy Manns he was "high risk" catheterization which I agree with. - heparin drip running  -He had recurrent chest pain last night requiring up titration of his IV nitroglycerin.  He is pain-free this morning although his systolic blood pressure is 85. -His 2D echo came back EF 45 to 50%, surprisingly preserved.  Based on this, we have agreed to proceed with high risk percutaneous intervention of his LAD and ostial circumflex by that with Dr. Angelena Form later  this morning.  The patient and his family understand the risks involved and that he is not a surgical candidate but that he has no medication options either.   2. S/p TAVR - stable on heart cath yesterday   3. ESRD on HD - currently in HD   4. Chronic systolic and diastolic heart failure - echo 2018 with LVEF of 40% - volume management by HD and nephrology   5. Hypoxia this morning with cough - ordered CXR - if PNA, ABX per primary  I have reviewed his angiograms with Drs. Burt Knack, and [place].  I agree that surgery is probably not a viable option and PCI is high risk given the angulation of the circumflex on the left main requiring orbital atherectomy.  The patient's clinical status becomes became more tenuous last night with recurrent chest pain requiring escalation of his IV nitroglycerin dose with subsequent hypotension.  The 2D echo came back revealing an EF of 45 to 50%.  Based on this, and after long discussion with the patient and his family this morning at bedside we have come to consensus that the patient wishes to proceed with high risk percutaneous intervention understanding that he not a back-up surgical candidate.  Dr. Angelena Form has agreed to perform this procedure.  For questions or updates, please contact [place] Please consult [email or web address] for contact info under Cardiology/STEMI.       Signed, Quay Burow, MD  04/18/2018, 8:58 AM

## 2018-04-18 NOTE — Progress Notes (Signed)
Pt continues to c/o CP.  BP stable x 27minutes w/ SPB 90-100.  This RN again reinforced to pt education regarding nitroglycerin and the need to hold nitroglycerin in the setting of hypotension.  Pt states "I'm going to have to go to the ER."  Pt informed he was in the hospital and pt stated "I know where I am, you just aren't doing anything for me."  "I'm just going to have to get my family to bring me my nitroglycerin from home since you aren't providing me with it."  This RN educated pt regarding policy against taking home medications as well as the dangers of using home nitroglycerin while receiving IV nitro due to blood pressure and if need arises to hold nitroglycerin IV due to hypotension.  Night cardiologist informed of patient's continued chest pain.

## 2018-04-18 NOTE — Progress Notes (Addendum)
Called to see secondary to chest pain post PCI. Pt says it "5/10". Worse with deep breathing. He is obese and has audible upper airway wheezing, unable to sit him up secondary to RFA approach. His O2 sat is 72%, B/P 091 systolic, HR 98K. His EKG is paced. Will add low dose NTG IV. The pt is intermittently falling asleep despite c/o chest pain.   Kerin Ransom PA-C 04/18/2018 2:20 PM  Agree with note written by Kerin Ransom [facility]  Agree with the above.  I saw the patient as well.  His EKG showed a paced rhythm.  He fell asleep during our conversation.  He is scheduled for hemodialysis at bedside this afternoon.  He status post successful LAD and ostial circumflex diamondback orbital rotational atherectomy and drug-eluting stenting.  Quay Burow 04/18/2018 2:32 PM

## 2018-04-18 NOTE — Progress Notes (Signed)
Physician notified: Kerin Ransom, PA  At: 1447  Regarding: pt c/o chest pain under bilat breast radiating to anterior jaw 10/10. NTG upped to 80mcg. BP 108/58. Requesting something else to pain relief.  Awaiting return response.  Physician notified: Lurena Joiner PA  At: 1529  Regarding: Pt still c/o 10/10, moaning. Restless in bed. NTG 35mcg. Requesting pain relief. Willing to try GI cocktail.  Awaiting return response.

## 2018-04-18 NOTE — Progress Notes (Signed)
Progress Note  Patient Name: Jeffrey [name] Date of Encounter: 04/18/2018  Primary Cardiologist: [name], MD   Subjective   2D echo obtained yesterday revealed an ejection fraction in the 45 to 50% range.  He did have more pain last night requiring titration of his IV nitroglycerin.  He is mildly hypotensive as a result.  We had a long discussion this morning with the patient and his family and have agreed to proceed with high risk percutaneous intervention by Dr. Angelena Form using atherectomy of his LAD and ostial circumflex.  He is not a surgical candidate.  Inpatient Medications    Scheduled Meds: . aspirin  81 mg Oral Pre-Cath  . [START ON 04/19/2018] aspirin EC  81 mg Oral Daily  . atorvastatin  80 mg Oral q1800  . Chlorhexidine Gluconate Cloth  6 each Topical Q0600  . cinacalcet  30 mg Oral Q supper  . darbepoetin (ARANESP) injection - DIALYSIS  60 mcg Intravenous Q Thu-HD  . doxercalciferol  2 mcg Intravenous Q M,W,F-HD  . famotidine  20 mg Oral Daily  . insulin aspart  0-15 Units Subcutaneous TID WC  . insulin aspart  0-5 Units Subcutaneous QHS  . insulin aspart  4 Units Subcutaneous TID WC  . insulin detemir  50 Units Subcutaneous QHS  . metoprolol tartrate  25 mg Oral BID  . sodium chloride flush  3 mL Intravenous Q12H  . sodium chloride flush  3 mL Intravenous Q12H  . sodium chloride flush  3 mL Intravenous Q12H  . triamcinolone 0.1 % cream : eucerin   Topical TID   Continuous Infusions: . sodium chloride    . sodium chloride    . sodium chloride    . sodium chloride    . heparin 1,700 Units/hr (04/17/18 2055)  . nitroGLYCERIN 20 mcg/min (04/18/18 0702)   PRN Meds: sodium chloride, sodium chloride, sodium chloride, acetaminophen, calcium carbonate (dosed in mg elemental calcium), camphor-menthol **AND** hydrOXYzine, docusate sodium, feeding supplement (NEPRO CARB STEADY), ondansetron (ZOFRAN) IV, sodium chloride flush, sodium chloride flush, sodium chloride  flush, sorbitol, zolpidem   Vital Signs    Vitals:   04/18/18 0600 04/18/18 0630 04/18/18 0636 04/18/18 0733  BP: (!) 107/51 (!) 75/38 (!) 135/108 (!) 100/56  Pulse:    93  Resp: 13 16 15 18   Temp:    98.3 F (36.8 C)  TempSrc:    Oral  SpO2:    100%  Weight:      Height:        Intake/Output Summary (Last 24 hours) at 04/18/2018 0858 Last data filed at 04/17/2018 1737 Gross per 24 hour  Intake -  Output 1303 ml  Net -1303 ml   Filed Weights   04/15/18 1210 04/17/18 1317 04/17/18 1737  Weight: 233 lb 4 oz (105.8 kg) 248 lb 0.3 oz (112.5 kg) 245 lb 2.4 oz (111.2 kg)    Telemetry    Paced rhythm - Personally Reviewed  ECG    No new tracings - Personally Reviewed  Physical Exam   GEN: No acute distress.   Neck: No JVD Cardiac: RRR, no murmurs, rubs, or gallops.  Respiratory: Clear to auscultation bilaterally. GI: Soft, nontender, non-distended  MS: No edema; No deformity. Neuro:  Nonfocal  Psych: Normal affect   Labs    Chemistry Recent Labs  Lab 04/14/18 0230 04/15/18 0311 04/18/18 0310  NA 134* 136 134*  K 4.5 4.7 4.0  CL 97* 98* 93*  CO2 22 23 29  GLUCOSE 233* 162* 253*  BUN 61* 68* 24*  CREATININE 6.59* 7.11* 4.13*  CALCIUM 8.8* 8.5* 7.6*  GFRNONAA 7* 6* 12*  GFRAA 8* 8* 14*  ANIONGAP 15 15 12      Hematology Recent Labs  Lab 04/16/18 0212 04/17/18 0219 04/18/18 0644  WBC 7.3 8.1 8.0  RBC 3.02* 2.94* 2.90*  HGB 9.5* 9.0* 8.8*  HCT 28.6* 27.6* 27.4*  MCV 94.7 93.9 94.5  MCH 31.5 30.6 30.3  MCHC 33.2 32.6 32.1  RDW 14.2 14.2 14.4  PLT 162 155 162    Cardiac Enzymes Recent Labs  Lab 04/14/18 1003 04/14/18 1714 04/14/18 2346  TROPONINI 0.36* 0.53* 0.47*    Recent Labs  Lab 04/14/18 0239  TROPIPOC 0.14*     BNPNo results for input(s): BNP, PROBNP in the last 168 hours.   DDimer No results for input(s): DDIMER in the last 168 hours.   Radiology    No results found.  Cardiac Studies   Left heart cath 04/14/18:  Colon Flattery  Cx lesion is 95% stenosed.  Prox LAD-1 lesion is 60% stenosed.  Prox LAD-2 lesion is 70% stenosed.  Prox RCA to Dist RCA lesion is 100% stenosed.  Prox Cx to Mid Cx lesion is 30% stenosed.  Ost 2nd Diag lesion is 70% stenosed.   Significant multivessel coronary calcification with 3 vessel CAD, and 60% proximal LAD stenosis, 70% calcified stenosis between the first and second diagonal vessel; dominant left circumflex coronary artery with new 90-95% ostial stenosis and diffuse 30% mid stenosis; total chronic mid RCA occlusion.  There is left to right collateralization to the distal RCA.  Well-seated TAVR valve.  RECOMMENDATION: With the patient's significant coronary calcification, and significant multivessel CAD with high-grade ostial circumflex stenosis in a large dominant circumflex vessel, calcified LAD disease and left-to-right collateralization to the distal RCA, will ask Dr. Ricard Dillon to reevaluate for consideration of possible CABG revascularization surgery in this patient with a history of prior TAVR.  Patient Profile     77 y.o. 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  Cardiac catheterization revealed a non-chronic total RCA, 75% calcified mid LAD and 95% calcified ostial circumflex with an acute angle of the left main.  He was seen by Dr. Roxy Manns for cardiovascular consultation who felt he was "high risk" for surgical revascularization.  We are awaiting the results of his 2D echocardiogram.   Assessment & Plan    1. NSTEMI, CAD, heart cath 04/14/18 with significant 3v disease including total chronic occlusion of the mid RCA with left-to-right collaterals, 75% calcified mid LAD and 95% calcified ostial circumflex  coronary artery. - no intervention during heart cath - consulted TCTS for possible revascularization .  He was seen by Dr. Roxy Manns he was "high risk" catheterization which I agree with. - heparin drip running  -He had recurrent chest pain last night requiring up titration of his IV nitroglycerin.  He is pain-free this morning although his systolic blood pressure is 85. -His 2D echo came back EF 45 to 50%, surprisingly preserved.  Based on this, we have agreed to proceed with high risk percutaneous intervention of his LAD and ostial circumflex by that with Dr. Angelena Form later  this morning.  The patient and his family understand the risks involved and that he is not a surgical candidate but that he has no medication options either.   2. S/p TAVR - stable on heart cath yesterday   3. ESRD on HD - currently in HD   4. Chronic systolic and diastolic heart failure - echo 2018 with LVEF of 40% - volume management by HD and nephrology   5. Hypoxia this morning with cough - ordered CXR - if PNA, ABX per primary  I have reviewed his angiograms with Drs. Burt Knack, and [place].  I agree that surgery is probably not a viable option and PCI is high risk given the angulation of the circumflex on the left main requiring orbital atherectomy.  The patient's clinical status becomes became more tenuous last night with recurrent chest pain requiring escalation of his IV nitroglycerin dose with subsequent hypotension.  The 2D echo came back revealing an EF of 45 to 50%.  Based on this, and after long discussion with the patient and his family this morning at bedside we have come to consensus that the patient wishes to proceed with high risk percutaneous intervention understanding that he not a back-up surgical candidate.  Dr. Angelena Form has agreed to perform this procedure.  For questions or updates, please contact [place] Please consult [email or web address] for contact info under Cardiology/STEMI.       Signed, Quay Burow, MD  04/18/2018, 8:58 AM

## 2018-04-18 NOTE — Progress Notes (Signed)
Pt SBP remains greater than 100 on nitroglycerin drip.  Pt continues to complain of cp.  Pt currently states "awoken from sleep with cp and unable to breathe."  Nitroglycerin titrated per orders with SBP remaining greater than 100 during titrations.  Pt also given calcium carbonate for indigestion. Cardiologist at bedside to speak with pt and evaluate chest pain.

## 2018-04-18 NOTE — Progress Notes (Signed)
ACT- 186. Goal for sheath pull is 175. Continue to monitor.

## 2018-04-19 LAB — BASIC METABOLIC PANEL
Anion gap: 14 (ref 5–15)
BUN: 17 mg/dL (ref 6–20)
CO2: 26 mmol/L (ref 22–32)
CREATININE: 3.18 mg/dL — AB (ref 0.61–1.24)
Calcium: 8 mg/dL — ABNORMAL LOW (ref 8.9–10.3)
Chloride: 94 mmol/L — ABNORMAL LOW (ref 101–111)
GFR calc Af Amer: 20 mL/min — ABNORMAL LOW (ref >60)
GFR, EST NON AFRICAN AMERICAN: 17 mL/min — AB (ref >60)
Glucose, Bld: 334 mg/dL — ABNORMAL HIGH (ref 65–99)
Potassium: 4.5 mmol/L (ref 3.5–5.1)
SODIUM: 134 mmol/L — AB (ref 135–145)

## 2018-04-19 LAB — GLUCOSE, CAPILLARY
GLUCOSE-CAPILLARY: 373 mg/dL — AB (ref 65–99)
GLUCOSE-CAPILLARY: 420 mg/dL — AB (ref 65–99)
Glucose-Capillary: 404 mg/dL — ABNORMAL HIGH (ref 65–99)
Glucose-Capillary: 451 mg/dL — ABNORMAL HIGH (ref 65–99)

## 2018-04-19 LAB — CBC
HCT: 27.1 % — ABNORMAL LOW (ref 39.0–52.0)
Hemoglobin: 8.8 g/dL — ABNORMAL LOW (ref 13.0–17.0)
MCH: 30.6 pg (ref 26.0–34.0)
MCHC: 32.5 g/dL (ref 30.0–36.0)
MCV: 94.1 fL (ref 78.0–100.0)
PLATELETS: 143 10*3/uL — AB (ref 150–400)
RBC: 2.88 MIL/uL — ABNORMAL LOW (ref 4.22–5.81)
RDW: 14.2 % (ref 11.5–15.5)
WBC: 7 10*3/uL (ref 4.0–10.5)

## 2018-04-19 MED ORDER — INSULIN ASPART 100 UNIT/ML ~~LOC~~ SOLN
10.0000 [IU] | Freq: Once | SUBCUTANEOUS | Status: AC
Start: 2018-04-19 — End: 2018-04-19
  Administered 2018-04-19: 10 [IU] via SUBCUTANEOUS

## 2018-04-19 MED ORDER — NITROGLYCERIN 0.4 MG SL SUBL: 0.4000 mg | SUBLINGUAL_TABLET | SUBLINGUAL | Status: DC | PRN

## 2018-04-19 MED ORDER — CLOPIDOGREL BISULFATE 75 MG PO TABS: 75.0000 mg | ORAL_TABLET | Freq: Every day | ORAL | 4 refills | Status: DC

## 2018-04-19 MED ORDER — INSULIN DETEMIR 100 UNIT/ML ~~LOC~~ SOLN: 50.0000 [IU] | Freq: Every day | SUBCUTANEOUS | 2 refills | Status: DC

## 2018-04-19 MED ORDER — NITROGLYCERIN 0.4 MG SL SUBL: 0.4000 mg | SUBLINGUAL_TABLET | SUBLINGUAL | 12 refills | Status: AC | PRN

## 2018-04-19 MED ORDER — TRIAMCINOLONE 0.1 % CREAM:EUCERIN CREAM 1:1: 1.0000 "application " | TOPICAL_CREAM | Freq: Three times a day (TID) | CUTANEOUS | 1 refills | Status: DC | PRN

## 2018-04-19 MED ORDER — IPRATROPIUM BROMIDE 0.02 % IN SOLN
0.5000 mg | Freq: Three times a day (TID) | RESPIRATORY_TRACT | Status: DC
  Administered 2018-04-19: 0.5 mg via RESPIRATORY_TRACT
  Filled 2018-04-19: qty 2.5

## 2018-04-19 MED ORDER — ALBUTEROL SULFATE HFA 108 (90 BASE) MCG/ACT IN AERS: 2.0000 | INHALATION_SPRAY | Freq: Four times a day (QID) | RESPIRATORY_TRACT | 2 refills | Status: DC | PRN

## 2018-04-19 MED ORDER — LEVALBUTEROL HCL 0.63 MG/3ML IN NEBU
0.6300 mg | INHALATION_SOLUTION | Freq: Three times a day (TID) | RESPIRATORY_TRACT | Status: DC
  Administered 2018-04-19: 0.63 mg via RESPIRATORY_TRACT
  Filled 2018-04-19: qty 3

## 2018-04-19 NOTE — Progress Notes (Signed)
Pt states "I haven't had chest pain since yesterday afternoon."  Nitroglycerin decreased to 40mcg/min.  Will monitor pt and pt educated to call if experiencing chest pain.

## 2018-04-19 NOTE — Progress Notes (Signed)
Pt discharged home with wife and son via wheelchair.  Discharge instructions including post cardiac catheterization care and acute coronary syndrome reviewed and pt verbalizes understanding.  Medications reviewed with patient.  IV and telemetry discontinued.

## 2018-04-19 NOTE — Progress Notes (Signed)
[facility] NEPHROLOGY PROGRESS NOTE  Assessment/ Plan: Pt is a 77 y.o. yo male  with ESRD Chronic HD on  MWF ( [place] and compliant ) admitted with non-STEMI.  Status post cardiac catheter with three-vessel disease. Dialysis Orders: Center: [facility]   on mwf . EDW 106kg HD Bath 2k, 3.5 ca  Time 4hr Heparin NONE. Access LUA AVF  Hec 2 mcg IV/HD Mircera 60 mcg  q 2wks ( last on 04/02/18)  Assessment/Plan:  #Chest pain/NSTEMI: Cath showed three-vessel disease.  High risk for CT surgery.  Echocardiogram with EF of 45 to 50%.  Patient underwent cardiac cath on 5/24 with stent placement.  Denied chest pain or shortness of breath today.  Cardiology evaluation ongoing.  Still on lower dose of nitro drip.  # ESRD:MWF, received dialysis yesterday with 2 L of ultrafiltration.  Serum potassium level 4.5.  Plan for next dialysis on Monday.  # Anemia: Hemoglobin 8.8.  Continue Aranesp.  # Secondary hyperparathyroidism: Continue Sensipar, Hectorol  # HTN/volume: Currently on nitro for chest pain.  Blood pressure acceptable.  Continue to monitor.  Subjective: Seen and examined at bedside.  Had a stent placed yesterday.  Denies chest pain or shortness of breath.  No nausea vomiting.  Tolerated dialysis well yesterday.  Objective Vital signs in last 24 hours: Vitals:   04/19/18 0307 04/19/18 0400 04/19/18 0720 04/19/18 0830  BP: 126/68 102/63 (!) 99/46   Pulse: (!) 101 94 85   Resp: 14 15 (!) 22   Temp: (!) 97.5 F (36.4 C)  97.9 F (36.6 C)   TempSrc: Oral  Oral   SpO2: 97% 94% 96% 95%  Weight:      Height:       Weight change: 2.4 kg (5 lb 4.7 oz)  Intake/Output Summary (Last 24 hours) at 04/19/2018 1052 Last data filed at 04/19/2018 0900 Gross per 24 hour  Intake 1215 ml  Output 2001 ml  Net -786 ml       Labs: Basic Metabolic Panel: Recent Labs  Lab 04/15/18 0311 04/18/18 0310 04/19/18 0227  NA 136 134* 134*  K 4.7 4.0 4.5  CL 98* 93* 94*  CO2 23 29 26    GLUCOSE 162* 253* 334*  BUN 68* 24* 17  CREATININE 7.11* 4.13* 3.18*  CALCIUM 8.5* 7.6* 8.0*   Liver Function Tests: No results for input(s): AST, ALT, ALKPHOS, BILITOT, PROT, ALBUMIN in the last 168 hours. No results for input(s): LIPASE, AMYLASE in the last 168 hours. No results for input(s): AMMONIA in the last 168 hours. CBC: Recent Labs  Lab 04/15/18 0311 04/16/18 0212 04/17/18 0219 04/18/18 0644 04/19/18 0227  WBC 10.2 7.3 8.1 8.0 7.0  HGB 10.3* 9.5* 9.0* 8.8* 8.8*  HCT 31.1* 28.6* 27.6* 27.4* 27.1*  MCV 93.4 94.7 93.9 94.5 94.1  PLT 175 162 155 162 143*   Cardiac Enzymes: Recent Labs  Lab 04/14/18 1003 04/14/18 1714 04/14/18 2346  TROPONINI 0.36* 0.53* 0.47*   CBG: Recent Labs  Lab 04/18/18 1729 04/18/18 2138 04/19/18 0632 04/19/18 0753 04/19/18 0944  GLUCAP 202* 197* 373* 404* 451*    Iron Studies: No results for input(s): IRON, TIBC, TRANSFERRIN, FERRITIN in the last 72 hours. Studies/Results: No results found.  Medications: Infusions: . sodium chloride    . sodium chloride    . sodium chloride    . sodium chloride    . sodium chloride    . sodium chloride    . sodium chloride    .  nitroGLYCERIN Stopped (04/19/18 1047)    Scheduled Medications: . aspirin EC  81 mg Oral Daily  . atorvastatin  80 mg Oral q1800  . Chlorhexidine Gluconate Cloth  6 each Topical Q0600  . cinacalcet  30 mg Oral Q supper  . clopidogrel  75 mg Oral Daily  . darbepoetin (ARANESP) injection - DIALYSIS  60 mcg Intravenous Q Thu-HD  . doxercalciferol  2 mcg Intravenous Q M,W,F-HD  . famotidine  20 mg Oral Daily  . insulin aspart  0-15 Units Subcutaneous TID WC  . insulin aspart  0-5 Units Subcutaneous QHS  . insulin aspart  4 Units Subcutaneous TID WC  . insulin detemir  50 Units Subcutaneous QHS  . levalbuterol  0.63 mg Nebulization TID   And  . ipratropium  0.5 mg Nebulization TID  . metoprolol tartrate  25 mg Oral BID  . sodium chloride flush  3 mL  Intravenous Q12H  . sodium chloride flush  3 mL Intravenous Q12H  . sodium chloride flush  3 mL Intravenous Q12H  . triamcinolone 0.1 % cream : eucerin   Topical TID    have reviewed scheduled and prn medications.  Physical Exam: General: Comfortable, not in distress Heart: Regular rate rhythm S1-S2 normal Lungs: Clear bilateral, no crackle or wheeze Abdomen:soft, Non-tender, non-distended Extremities: No edema. Dialysis Access: Left AV fistula  Jeffrey [name] 04/19/2018,10:52 AM  LOS: 4 days

## 2018-04-19 NOTE — Progress Notes (Signed)
Patient with Right groin femoral sheath ACT read 169 after dialysis completed. Sheath pulled and manual pressure healed x 20 minutes. Patient tolerate procedure well. Pressure dressing applied to site and instructions given to patient to hold pressure on site when coughing. Patient stated he understood. Vial signs stable through out sheath pulling. Procedure ended at [place]. Patient voiced no complaints. Right groin level zero.Will continue to monitor closely.

## 2018-04-19 NOTE — Progress Notes (Signed)
CARDIAC REHAB PHASE I   PRE:  Rate/Rhythm: paced 90  BP:  Supine:   Sitting: 123/74  Standing:    SaO2: 96%RA  MODE:  Ambulation: 100 ft   POST:  Rate/Rhythm: 111  BP:  Supine:   Sitting: 158/79  Standing:    SaO2: 97%RA 1000-1052 Pt walked 100 ft on RA with gait belt use, cane and asst x 1. Denied CP but c/o being tired. Tolerated well. MI education completed with pt and wife who voiced understanding. Stressed importance of plavix with stent. Reviewed MI restrictions, NTG use, refer to dietitian at HD for diet ed, CRP 2. Will refer to [place] CRP 2 but pt may not be able to attend due to dialysis. Encouraged light walking as tolerated with assistance.    Graylon Good, RN BSN  04/19/2018 10:45 AM

## 2018-04-19 NOTE — Discharge Summary (Signed)
Physician Discharge Summary   Patient ID: COLEMAN KALAS MRN: [id number] DOB/AGE: [birth year] 77 y.o.  Admit date: 04/14/2018 Discharge date: 04/19/2018  Primary Care Physician:  Thurman Coyer, MD   Recommendations for Outpatient Follow-up:  1. Follow up with PCP in 1-2 weeks  Home Health: None  Equipment/Devices:   Discharge Condition: stable  CODE STATUS: FULL Diet recommendation: Heart healthy diet   Discharge Diagnoses:    . NSTEMI (non-ST elevated myocardial infarction) ([place]) . Atherosclerosis of native coronary artery of native heart with angina pectoris ([place]) . Atrial fibrillation ([place]) [I48.91] . Automatic implantable cardioverter-defibrillator in situ . Chronic combined systolic and diastolic CHF (congestive heart failure) ([place]) . Elevated troponin . Essential hypertension . Hyperlipidemia . Mobitz (type) I (Wenckebach's) atrioventricular block . Rash    Consults: Cardiology Nephrology    Allergies:   Allergies  Allergen Reactions  . Byetta 10 Mcg Pen [Exenatide] Diarrhea and Nausea And Vomiting  . Codeine Itching  . Coumadin [Warfarin Sodium] Rash     DISCHARGE MEDICATIONS: Allergies as of 04/19/2018      Reactions   Byetta 10 Mcg Pen [exenatide] Diarrhea, Nausea And Vomiting   Codeine Itching   Coumadin [warfarin Sodium] Rash      Medication List    TAKE these medications   albuterol 108 (90 Base) MCG/ACT inhaler Commonly known as:  PROVENTIL HFA;VENTOLIN HFA Inhale 2 puffs into the lungs every 6 (six) hours as needed for wheezing or shortness of breath.   aspirin EC 81 MG tablet Take 1 tablet (81 mg total) by mouth daily.   atorvastatin 40 MG tablet Commonly known as:  LIPITOR Take 2 tablets (80 mg total) by mouth daily.   cinacalcet 30 MG tablet Commonly known as:  SENSIPAR Take 30 mg by mouth daily with supper.   clopidogrel 75 MG tablet Commonly known as:  PLAVIX Take 1 tablet (75 mg total) by mouth daily. Start taking  on:  04/20/2018   insulin detemir 100 UNIT/ML injection Commonly known as:  LEVEMIR Inject 0.5 mLs (50 Units total) into the skin at bedtime. What changed:  how much to take   lidocaine-prilocaine cream Commonly known as:  EMLA Apply 1 application topically once as needed (prior to accessing port).   metoprolol tartrate 25 MG tablet Commonly known as:  LOPRESSOR Take 1 tablet (25 mg total) by mouth 2 (two) times daily.   nitroGLYCERIN 0.4 MG SL tablet Commonly known as:  NITROSTAT Place 1 tablet (0.4 mg total) under the tongue every 5 (five) minutes as needed for chest pain.   ranitidine 150 MG tablet Commonly known as:  ZANTAC Take 150 mg by mouth 2 (two) times daily as needed for heartburn.   triamcinolone 0.1 % cream : eucerin Crea Apply 1 application topically 3 (three) times daily as needed. Apply to arms, back, and other areas with active rash/itching        Brief H and P: For complete details please refer to admission H and P, but in brief 77 year old Caucasian male with a past medical history of diabetes, aortic stenosis status post TAVR, pacemaker placement, peripheral vascular disease hypertension, end-stage renal disease on hemodialysis, history of stroke, coronary artery disease presented with chest pain. He was seen by cardiology. He underwent cardiac catheterization which showed multivessel disease. Cardiothoracic surgery was consulted by cardiology.    Hospital Course:   NSTEMI (non-ST elevated myocardial infarction) [facility]) underlying known history of CAD -Cardiology was consulted.  Cardiac cath 5/20 showed multivessel coronary artery  disease including total chronic occlusion of the mid RCA with left-to-right collaterals, 75% calcified mid LAD and 95% calcified ostial circumflex coronary artery.  No intervention done during the cardiac cath. -Cardiothoracic surgery was consulted, seen by Dr. Roxy Manns, felt high risk for surgical revascularization,  -2D echo showed  EF of 45 to 50% with regional wall motion abnormalities -Underwent cardiac cath, showed severe stenosis proximal to mid LAD, status post successful PCI.  Severe stenosis of ostial circumflex artery status post PCI -Post-cath patient had an episode of acute wheezing, placed on scheduled nebs and given Solu-Medrol. Doing well,-no wheezing, no chest pain or shortness of breath.  Cleared by cardiology for discharge home.  Nitroglycerin weaned off.  Continue dual antiplatelet agents with aspirin and Plavix.     Type 2 diabetes mellitus with chronic kidney disease on chronic dialysis, with long-term current use of insulin (HCC) -CBG was elevated this morning due to IV steroids, dietary noncompliance, eating [employer] in the room. 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 HEENT: anicteric sclera, pupils reactive to light and accommodation CVS: S1-S2 clear no murmur rubs or  gallops Chest: clear to auscultation bilaterally, no wheezing rales or rhonchi Abdomen: soft nontender, nondistended, normal bowel sounds Extremities: no cyanosis, clubbing or edema noted bilaterally Neuro: Cranial nerves II-XII intact, no focal neurological deficits   The results of significant diagnostics from this hospitalization (including imaging, microbiology, ancillary and laboratory) are listed below for reference.      Procedures/Studies:  Procedures   CORONARY STENT INTERVENTION  CORONARY ATHERECTOMY  Conclusion     Prox LAD-1 lesion is 60% stenosed.  Prox LAD-2 lesion is 70% stenosed.  Ost 2nd Diag lesion is 70% stenosed.  Ost Cx lesion is 95% stenosed.  Prox Cx to Mid Cx lesion is 30% stenosed.  Prox RCA to Dist RCA lesion is 100% stenosed.  A drug-eluting stent was successfully placed using a STENT SYNERGY DES 3X16.  Post intervention, there is a 0% residual stenosis.  A drug-eluting stent was successfully placed using a STENT SYNERGY DES 3X38.  Post intervention, there is a 0% residual stenosis.  Post intervention, there is a 0% residual stenosis.   1. Severe stenosis proximal to mid LAD 2. Successful PTCA/orbital atherectomy/DES x 1 proximal to mid LAD 3. Severe stenosis ostial Circumflex artery 4. Successful PTCA/orbital atherectomy/DES x 1 ostium Circumflex  Recommendations: Will continue DAPT with ASA and Plavix for  one year.      Dg Chest 2 View  Result Date: 04/14/2018 CLINICAL DATA:  Chest pain EXAM: CHEST - 2 VIEW COMPARISON:  11/14/2017 FINDINGS: Right-sided pacing device as before, incompletely visualized ventricular leads. Cardiac valve. Incomplete inclusion of the lung bases. Hyperinflation. No pleural effusion. Cardiomegaly. Aortic atherosclerosis. No pneumothorax. IMPRESSION: No active cardiopulmonary disease.  Cardiomegaly Electronically Signed   By: Donavan Foil M.D.   On: 04/14/2018 03:17   Dg Chest Port 1 View  Result Date:  04/15/2018 CLINICAL DATA:  Cough and shortness of breath EXAM: PORTABLE CHEST 1 VIEW COMPARISON:  Apr 14, 2018 FINDINGS: There is no appreciable edema or consolidation. There is cardiomegaly with pulmonary vascularity within normal limits. Pacemaker leads are attached to the right atrium and right ventricle. There is aortic atherosclerosis. No evident adenopathy. No bone lesions. IMPRESSION: Stable cardiac prominence. Aortic atherosclerosis. Pacemaker leads attached to right atrium and right ventricle. No edema or consolidation. Aortic Atherosclerosis (ICD10-I70.0). Electronically Signed   By: Lowella Grip III M.D.   On: 04/15/2018 14:44      LAB RESULTS: Basic Metabolic Panel: Recent Labs  Lab 04/18/18 0310 04/19/18 0227  NA 134* 134*  K 4.0 4.5  CL 93* 94*  CO2 29 26  GLUCOSE 253* 334*  BUN 24* 17  CREATININE 4.13* 3.18*  CALCIUM 7.6* 8.0*   Liver Function Tests: No results for input(s): AST, ALT, ALKPHOS, BILITOT, PROT, ALBUMIN in the last 168 hours. No results for input(s): LIPASE, AMYLASE in the last 168 hours. No results for input(s): AMMONIA in the last 168 hours. CBC: Recent Labs  Lab 04/18/18 0644 04/19/18 0227  WBC 8.0 7.0  HGB 8.8* 8.8*  HCT 27.4* 27.1*  MCV 94.5 94.1  PLT 162 143*   Cardiac Enzymes: Recent Labs  Lab 04/14/18 1714 04/14/18 2346  TROPONINI 0.53* 0.47*   BNP: Invalid input(s): POCBNP CBG: Recent Labs  Lab 04/19/18 0753 04/19/18 0944  GLUCAP 404* 451*      Disposition and Follow-up: Discharge Instructions    Amb Referral to Cardiac Rehabilitation   Complete by:  As directed    Referring to [place] Phase 2   Diagnosis:   NSTEMI Coronary Stents     Diet Carb Modified   Complete by:  As directed    Increase activity slowly   Complete by:  As directed        DISPOSITION: [place], Dianna Rossetti, MD. Schedule an appointment as soon as possible for a visit in 2 week(s).    Specialty:  Internal Medicine Contact information: [address] [place] [address] [phone]        Deboraha Sprang, MD. Schedule an appointment as soon as possible for a visit in 2 week(s).   Specialty:  Cardiology Contact information: [address] [place] [address] [phone]            Time coordinating discharge:  35 minutes  Signed:   Estill Cotta M.D. [facility] 04/19/2018, 12:15 PM Pager: [phone]

## 2018-04-19 NOTE — Progress Notes (Signed)
HD tx completed @ 2315 w/o problem, UF goal met, blood rinsed back, VSS, report given to Erlene Quan, RN

## 2018-04-19 NOTE — Progress Notes (Signed)
DAILY PROGRESS NOTE   Patient Name: Jeffrey [name] Date of Encounter: 04/19/2018  Chief Complaint   "I feel 100% better today"  Patient Profile   77 y.o. 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  Cardiac catheterization revealed a non-chronic total RCA, 75% calcified mid LAD and 95% calcified ostial circumflex with an acute angle of the left main.  He was seen by Dr. Roxy Manns for cardiovascular consultation who felt he was "high risk" for surgical revascularization.  We are awaiting the results of his 2D echocardiogram.  Subjective   No further chest pain overnight - awake and alert. Wants to go home.  Objective   Vitals:   04/19/18 0720 04/19/18 0830 04/19/18 1003 04/19/18 1012  BP: (!) 99/46  123/74 (!) 158/79  Pulse: 85  90 93  Resp: (!) 22  (!) 26 20  Temp: 97.9 F (36.6 C)     TempSrc: Oral     SpO2: 96% 95% 96% 98%  Weight:      Height:        Intake/Output Summary (Last 24 hours) at 04/19/2018 1136 Last data filed at 04/19/2018 0900 Gross per 24 hour  Intake 1215 ml  Output 2001 ml  Net -786 ml   Filed Weights   04/17/18 1737 04/18/18 1900 04/18/18 2345  Weight: 245 lb 2.4 oz (111.2 kg) 253 lb 4.9 oz (114.9 kg) 248 lb 14.4 oz (112.9 kg)    Physical Exam   General appearance: alert and no distress Neck: no carotid bruit, no JVD and thyroid not enlarged, symmetric, no tenderness/mass/nodules Lungs: clear to auscultation bilaterally Heart: regular rate and rhythm Abdomen: soft, non-tender; bowel sounds normal; no masses,  no organomegaly Extremities: extremities normal, atraumatic, no cyanosis or edema Pulses: 2+ and symmetric Skin: Skin color, texture, turgor normal. No rashes or  lesions Neurologic: Grossly normal Psych: Pleasant  Inpatient Medications    Scheduled Meds: . aspirin EC  81 mg Oral Daily  . atorvastatin  80 mg Oral q1800  . Chlorhexidine Gluconate Cloth  6 each Topical Q0600  . cinacalcet  30 mg Oral Q supper  . clopidogrel  75 mg Oral Daily  . darbepoetin (ARANESP) injection - DIALYSIS  60 mcg Intravenous Q Thu-HD  . doxercalciferol  2 mcg Intravenous Q M,W,F-HD  . famotidine  20 mg Oral Daily  . insulin aspart  0-15 Units Subcutaneous TID WC  . insulin aspart  0-5 Units Subcutaneous QHS  . insulin aspart  4 Units Subcutaneous TID WC  . insulin detemir  50 Units Subcutaneous QHS  . levalbuterol  0.63 mg Nebulization TID   And  . ipratropium  0.5 mg Nebulization TID  . metoprolol tartrate  25 mg Oral BID  . sodium chloride flush  3 mL Intravenous Q12H  . sodium chloride flush  3 mL Intravenous Q12H  . sodium chloride flush  3 mL Intravenous Q12H  . triamcinolone 0.1 % cream : eucerin   Topical TID    Continuous Infusions: . sodium chloride    . sodium chloride    . sodium chloride    . sodium chloride    . sodium chloride    . sodium chloride    .  sodium chloride    . nitroGLYCERIN Stopped (04/19/18 1047)    PRN Meds: sodium chloride, sodium chloride, sodium chloride, sodium chloride, sodium chloride, sodium chloride, sodium chloride, acetaminophen, alteplase, calcium carbonate (dosed in mg elemental calcium), camphor-menthol **AND** hydrOXYzine, docusate sodium, feeding supplement (NEPRO CARB STEADY), heparin, levalbuterol, lidocaine (PF), lidocaine-prilocaine, ondansetron (ZOFRAN) IV, pentafluoroprop-tetrafluoroeth, sodium chloride flush, sodium chloride flush, sodium chloride flush, sorbitol, zolpidem   Labs   Results for orders placed or performed during the hospital encounter of 04/14/18 (from the past 48 hour(s))  Heparin level (unfractionated)     Status: Abnormal   Collection Time: 04/17/18 12:07 PM  Result Value Ref Range    Heparin Unfractionated 0.22 (L) 0.30 - 0.70 IU/mL    Comment: (NOTE) If heparin results are below expected values, and patient dosage has  been confirmed, suggest follow up testing of antithrombin III levels. Performed at [facility], [place] [address] [place], [place] [address]   Glucose, capillary     Status: Abnormal   Collection Time: 04/17/18 12:31 PM  Result Value Ref Range   Glucose-Capillary 197 (H) 65 - 99 mg/dL   Comment 1 Notify RN    Comment 2 Document in Chart   Glucose, capillary     Status: None   Collection Time: 04/17/18  6:26 PM  Result Value Ref Range   Glucose-Capillary 82 65 - 99 mg/dL   Comment 1 Notify RN    Comment 2 Document in Chart   Glucose, capillary     Status: Abnormal   Collection Time: 04/17/18  8:52 PM  Result Value Ref Range   Glucose-Capillary 142 (H) 65 - 99 mg/dL   Comment 1 Notify RN    Comment 2 Document in Chart   Heparin level (unfractionated)     Status: None   Collection Time: 04/18/18  3:10 AM  Result Value Ref Range   Heparin Unfractionated 0.32 0.30 - 0.70 IU/mL    Comment: (NOTE) If heparin results are below expected values, and patient dosage has  been confirmed, suggest follow up testing of antithrombin III levels. Performed at [facility], [place] [address] [place], [place] [address]   Basic metabolic panel     Status: Abnormal   Collection Time: 04/18/18  3:10 AM  Result Value Ref Range   Sodium 134 (L) 135 - 145 mmol/L   Potassium 4.0 3.5 - 5.1 mmol/L   Chloride 93 (L) 101 - 111 mmol/L   CO2 29 22 - 32 mmol/L   Glucose, Bld 253 (H) 65 - 99 mg/dL   BUN 24 (H) 6 - 20 mg/dL   Creatinine, Ser 4.13 (H) 0.61 - 1.24 mg/dL   Calcium 7.6 (L) 8.9 - 10.3 mg/dL   GFR calc non Af Amer 12 (L) >60 mL/min   GFR calc Af Amer 14 (L) >60 mL/min    Comment: (NOTE) The eGFR has been calculated using the CKD EPI equation. This calculation has not been validated in all clinical situations. eGFR's persistently <60 mL/min  signify possible Chronic Kidney Disease.    Anion gap 12 5 - 15    Comment: Performed at [facility] [address] [place] [address]  CBC     Status: Abnormal   Collection Time: 04/18/18  6:44 AM  Result Value Ref Range   WBC 8.0 4.0 - 10.5 K/uL   RBC 2.90 (L) 4.22 - 5.81 MIL/uL   Hemoglobin 8.8 (L) 13.0 - 17.0 g/dL   HCT  27.4 (L) 39.0 - 52.0 %   MCV 94.5 78.0 - 100.0 fL   MCH 30.3 26.0 - 34.0 pg   MCHC 32.1 30.0 - 36.0 g/dL   RDW 14.4 11.5 - 15.5 %   Platelets 162 150 - 400 K/uL    Comment: Performed at [facility] [address], [place], [place] [address]  Glucose, capillary     Status: Abnormal   Collection Time: 04/18/18  7:41 AM  Result Value Ref Range   Glucose-Capillary 182 (H) 65 - 99 mg/dL  POCT Activated clotting time     Status: None   Collection Time: 04/18/18 10:23 AM  Result Value Ref Range   Activated Clotting Time 356 seconds  Glucose, capillary     Status: Abnormal   Collection Time: 04/18/18  1:17 PM  Result Value Ref Range   Glucose-Capillary 167 (H) 65 - 99 mg/dL  Glucose, capillary     Status: Abnormal   Collection Time: 04/18/18  5:29 PM  Result Value Ref Range   Glucose-Capillary 202 (H) 65 - 99 mg/dL  Glucose, capillary     Status: Abnormal   Collection Time: 04/18/18  9:38 PM  Result Value Ref Range   Glucose-Capillary 197 (H) 65 - 99 mg/dL   Comment 1 Notify RN   CBC     Status: Abnormal   Collection Time: 04/19/18  2:27 AM  Result Value Ref Range   WBC 7.0 4.0 - 10.5 K/uL   RBC 2.88 (L) 4.22 - 5.81 MIL/uL   Hemoglobin 8.8 (L) 13.0 - 17.0 g/dL   HCT 27.1 (L) 39.0 - 52.0 %   MCV 94.1 78.0 - 100.0 fL   MCH 30.6 26.0 - 34.0 pg   MCHC 32.5 30.0 - 36.0 g/dL   RDW 14.2 11.5 - 15.5 %   Platelets 143 (L) 150 - 400 K/uL    Comment: Performed at [facility], [place], [place], [place] [address]  Basic metabolic panel     Status: Abnormal   Collection Time: 04/19/18  2:27 AM  Result Value Ref Range   Sodium  134 (L) 135 - 145 mmol/L   Potassium 4.5 3.5 - 5.1 mmol/L   Chloride 94 (L) 101 - 111 mmol/L   CO2 26 22 - 32 mmol/L   Glucose, Bld 334 (H) 65 - 99 mg/dL   BUN 17 6 - 20 mg/dL   Creatinine, Ser 3.18 (H) 0.61 - 1.24 mg/dL   Calcium 8.0 (L) 8.9 - 10.3 mg/dL   GFR calc non Af Amer 17 (L) >60 mL/min   GFR calc Af Amer 20 (L) >60 mL/min    Comment: (NOTE) The eGFR has been calculated using the CKD EPI equation. This calculation has not been validated in all clinical situations. eGFR's persistently <60 mL/min signify possible Chronic Kidney Disease.    Anion gap 14 5 - 15    Comment: Performed at [facility] [address], [place], [place] [address]  Glucose, capillary     Status: Abnormal   Collection Time: 04/19/18  6:32 AM  Result Value Ref Range   Glucose-Capillary 373 (H) 65 - 99 mg/dL   Comment 1 Notify RN   Glucose, capillary     Status: Abnormal   Collection Time: 04/19/18  7:53 AM  Result Value Ref Range   Glucose-Capillary 404 (H) 65 - 99 mg/dL   Comment 1 Notify RN    Comment 2 Document in Chart   Glucose,  capillary     Status: Abnormal   Collection Time: 04/19/18  9:44 AM  Result Value Ref Range   Glucose-Capillary 451 (H) 65 - 99 mg/dL   Comment 1 Notify RN    Comment 2 Document in Chart     ECG   A-sensed, v-paced rhythm - Personally Reviewed  Telemetry   V-paced - Personally Reviewed  Radiology    No results found.  Cardiac Studies   N/A  Assessment   1. Principal Problem: 2.   NSTEMI (non-ST elevated myocardial infarction) ([place]) 3. Active Problems: 4.   Type 2 diabetes mellitus with chronic kidney disease on chronic dialysis, with long-term current use of insulin ([place]) 5.   Essential hypertension 6.   Elevated troponin 7.   ESRD (end stage renal disease) on dialysis ([place]) 8.   Atherosclerosis of native coronary artery of native heart with angina pectoris ([place]) 9.   Mobitz (type) I (Wenckebach's) atrioventricular block 10.   Chronic  combined systolic and diastolic CHF (congestive heart failure) ([place]) 11.   S/P TAVR (transcatheter aortic valve replacement) 12.   Hyperlipidemia 13.   Automatic implantable cardioverter-defibrillator in situ 14.   Atrial fibrillation ([place]) [I48.91] 15.   Rash 16.   Chest pain 17.   Plan   1. Feels much better today - no further angina. Will need to remain on DAPT with ASA and Plavix. Nitroglycerin gtts was weaned off. Would send home with SL nitro prn. Will need follow-up with Dr. Caryl Comes after discharge. Ok for d/c today from a cardiac standpoint.  Time Spent Directly with Patient:  I have spent a total of 15 minutes with the patient reviewing hospital notes, telemetry, EKGs, labs and examining the patient as well as establishing an assessment and plan that was discussed personally with the patient. > 50% of time was spent in direct patient care.  Length of Stay:  LOS: 4 days   Pixie Casino, MD, [facility], [place] Director of the Advanced Lipid Disorders &  Cardiovascular Risk Reduction Clinic Diplomate of the American Board of Clinical Lipidology Attending Cardiologist  Direct Dial: [phone]  Fax: [phone]  Website:  [email or web address] Cruise 04/19/2018, 11:36 AM

## 2018-04-20 ENCOUNTER — Telehealth: Payer: Self-pay | Admitting: Cardiology

## 2018-04-20 NOTE — Telephone Encounter (Signed)
I was was notified to contact the patient's wife. She reports that he has not slept in days and cannot sleep. She has tried benadryl which has not helped. I advised her to try OTC melatonin. If this does not work, she should contact his PCP for evaluation.

## 2018-04-22 LAB — POCT ACTIVATED CLOTTING TIME
ACTIVATED CLOTTING TIME: 241 s
ACTIVATED CLOTTING TIME: 219 s
Activated Clotting Time: 186 seconds
Activated Clotting Time: 169 seconds

## 2018-04-22 MED FILL — Heparin Sod (Porcine)-NaCl IV Soln 1000 Unit/500ML-0.9%: INTRAVENOUS | Qty: 1000 | Status: AC

## 2018-05-01 ENCOUNTER — Ambulatory Visit (INDEPENDENT_AMBULATORY_CARE_PROVIDER_SITE_OTHER): Payer: Medicare Other | Admitting: *Deleted

## 2018-05-01 DIAGNOSIS — R55 Syncope and collapse: Secondary | ICD-10-CM

## 2018-05-01 NOTE — Progress Notes (Signed)
Remote pacemaker transmission.   

## 2018-05-07 ENCOUNTER — Ambulatory Visit (INDEPENDENT_AMBULATORY_CARE_PROVIDER_SITE_OTHER): Payer: Medicare Other | Admitting: Internal Medicine

## 2018-05-07 ENCOUNTER — Encounter: Payer: Self-pay | Admitting: Internal Medicine

## 2018-05-07 VITALS — BP 118/50 | HR 66 | Ht 71.0 in | Wt 235.0 lb

## 2018-05-07 DIAGNOSIS — I5022 Chronic systolic (congestive) heart failure: Secondary | ICD-10-CM | POA: Diagnosis not present

## 2018-05-07 DIAGNOSIS — I4891 Unspecified atrial fibrillation: Secondary | ICD-10-CM

## 2018-05-07 DIAGNOSIS — Z950 Presence of cardiac pacemaker: Secondary | ICD-10-CM

## 2018-05-07 DIAGNOSIS — I442 Atrioventricular block, complete: Secondary | ICD-10-CM

## 2018-05-07 LAB — CUP PACEART INCLINIC DEVICE CHECK
Date Time Interrogation Session: 20190612141932
Implantable Lead Implant Date: 20160815
Implantable Lead Implant Date: 20160815
Implantable Lead Location: 753860
Implantable Pulse Generator Implant Date: 20160815
Lead Channel Pacing Threshold Amplitude: 0.7 V
Lead Channel Pacing Threshold Amplitude: 0.8 V
Lead Channel Pacing Threshold Amplitude: 0.8 V
Lead Channel Pacing Threshold Amplitude: 0.9 V
Lead Channel Pacing Threshold Pulse Width: 0.4 ms
Lead Channel Pacing Threshold Pulse Width: 0.4 ms
Lead Channel Pacing Threshold Pulse Width: 0.4 ms
Lead Channel Pacing Threshold Pulse Width: 0.4 ms
Lead Channel Sensing Intrinsic Amplitude: 3.3 mV
Lead Channel Setting Pacing Amplitude: 2 V
Lead Channel Setting Pacing Pulse Width: 0.4 ms
MDC IDC LEAD LOCATION: 753859
MDC IDC MSMT LEADCHNL RA IMPEDANCE VALUE: 429 Ohm
MDC IDC MSMT LEADCHNL RA PACING THRESHOLD AMPLITUDE: 0.8 V
MDC IDC MSMT LEADCHNL RA PACING THRESHOLD PULSEWIDTH: 0.4 ms
MDC IDC MSMT LEADCHNL RA SENSING INTR AMPL: 3.2 mV
MDC IDC MSMT LEADCHNL RV IMPEDANCE VALUE: 429 Ohm
MDC IDC MSMT LEADCHNL RV PACING THRESHOLD AMPLITUDE: 0.8 V
MDC IDC MSMT LEADCHNL RV PACING THRESHOLD PULSEWIDTH: 0.4 ms
MDC IDC MSMT LEADCHNL RV SENSING INTR AMPL: 11.4 mV
MDC IDC MSMT LEADCHNL RV SENSING INTR AMPL: 17.2 mV
MDC IDC SET LEADCHNL RV PACING AMPLITUDE: 2 V
MDC IDC SET LEADCHNL RV SENSING SENSITIVITY: 2 mV
MDC IDC STAT BRADY RA PERCENT PACED: 37 %
MDC IDC STAT BRADY RV PERCENT PACED: 99 %
Pulse Gen Model: 394929
Pulse Gen Serial Number: 68600861

## 2018-05-07 MED ORDER — CLOPIDOGREL BISULFATE 75 MG PO TABS: 75.0000 mg | ORAL_TABLET | Freq: Every day | ORAL | 3 refills | Status: DC

## 2018-05-07 MED ORDER — METOPROLOL TARTRATE 25 MG PO TABS: 25.0000 mg | ORAL_TABLET | Freq: Two times a day (BID) | ORAL | 3 refills | Status: DC

## 2018-05-07 NOTE — Progress Notes (Signed)
Patient Care Team: [name], Dianna Rossetti, MD as PCP - General (Internal Medicine) Deboraha Sprang, MD as PCP - Cardiology (Cardiology) Fleet Contras, MD as Consulting Physician (Nephrology)   HPI  Jeffrey [name] is a 77 y.o. male Seen in follow-up for Biotronik pacemaker implanted initially for intermittent complete heart block and then complete heart block complicating TAVR-sapien 3  He has known coronary disease..>>>"with coronary calcification and total occlusion of the proximal to mid RCA with extensive left-to-right collaterals; 50 - 60% stenosis in the proximal LAD after the first diagonal branch, and 30% mid AV groove circumflex stenoses.         Catheterization 11/15 demonstrated an occluded right coronary artery with left-right collateral LVEF post TAVR 55-60%   DATE TEST EF   6/18 Echo   40 %   5/19 Echo   45-50 %  LAE-severe (57/2 0.4/54)  5/19 LHC   LAD-70%>> stent; circumflex ostial 95%>> stent; RCA-total old      Since discharge he has been feeling quite well.  He is gradually regaining his strength.  He is having no chest pain.  No shortness of breath.  His appetite is improving.  He is undergoing dialysis and tolerated it reasonably well.  He has had atrial fibrillation and he was started on Coumadin.  This was discontinued because of a rash.  He is on low-dose aspirin.  Thromboembolic risk factors ( age  -2, HTN-1, TIA/CVA-2, DM-1, Vasc disease -1) for a CHADSVASc Score of 7     Past Medical History:  Diagnosis Date  . Anginal pain ([name])   . Aortic stenosis    a. severe by echo 09/2014  . Atrial fibrillation ([name])    a. not well documented, not on anticoagulation  . CHF (congestive heart failure) ([name])    04/28/17 echo-EF 40%, mod LVH, diastolic dysfunction  . Claustrophobia   . Complete heart block ([name])   . COPD (chronic obstructive pulmonary disease) ([place])   . Coronary artery disease    a. chronically occluded RCA per cath 09/2014 with  collaterals B. cath 05/01/17 chr occ RCA w/collaterals, 60-70% mid LAD,   . CVA (cerebral vascular accident) ([name]) 10/2014   denies residual on 07/11/2015  . ESRD (end stage renal disease) on dialysis [name])    a. on dialysis; [facility]; MWF, LUE fistula (07/11/2015)  . History of blood transfusion    "related to gallbladder OR"  . History of stomach ulcers   . Hyperlipidemia   . Hypertension   . Iron deficiency anemia   . Myocardial infarction ([name]) 10/2014  . Peripheral vascular disease ([name])   . Pneumonia   . Presence of permanent cardiac pacemaker   . 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 Type II diabetes mellitus ([name])     Past Surgical History:  Procedure Laterality Date  . AV FISTULA PLACEMENT Left 10/19/2014   Procedure: BRACHIOCEPHALIC ARTERIOVENOUS (AV) FISTULA CREATION ;  Surgeon: Conrad [name], MD;  Location: [place];  Service: Vascular;  Laterality: Left;  . CARDIAC CATHETERIZATION    . CARDIAC CATHETERIZATION N/A 07/22/2015   Procedure: Right/Left Heart Cath and Coronary Angiography;  Surgeon: [name], MD;  Location: [place] CV LAB;  Service: Cardiovascular;  Laterality: N/A;  . CATARACT EXTRACTION W/ INTRAOCULAR LENS  IMPLANT, BILATERAL Bilateral 1990's  . CHOLECYSTECTOMY OPEN  1980's  .  COLONOSCOPY W/ BIOPSIES AND POLYPECTOMY    . CORONARY ANGIOPLASTY    . CORONARY ATHERECTOMY N/A 04/18/2018   Procedure: CORONARY ATHERECTOMY;  Surgeon: [name], MD;  Location: [place] CV LAB;  Service: Cardiovascular;  Laterality: N/A;  . CORONARY STENT INTERVENTION N/A 04/18/2018   Procedure: CORONARY STENT INTERVENTION;  Surgeon: [name], MD;  Location: [place] CV LAB;  Service: Cardiovascular;  Laterality: N/A;  . EP IMPLANTABLE DEVICE N/A 07/11/2015   Procedure: Pacemaker Implant;  Surgeon: [name],  MD;  Location: [place] CV LAB;  Service: Cardiovascular;  Laterality: N/A;  . ESOPHAGOGASTRODUODENOSCOPY  08/01/2012   Procedure: ESOPHAGOGASTRODUODENOSCOPY (EGD);  Surgeon: [name], MD;  Location: [facility];  Service: Endoscopy;  Laterality: N/A;  . INSERT / REPLACE / REMOVE PACEMAKER  07/11/2015  . INSERTION OF DIALYSIS CATHETER Right 02/02/2015   Procedure: INSERTION OF DIALYSIS CATHETER  RIGHT INTERNAL JUGULAR;  Surgeon: Mal [name], MD;  Location: [place];  Service: Vascular;  Laterality: Right;  . LEFT AND RIGHT HEART CATHETERIZATION WITH CORONARY ANGIOGRAM N/A 09/30/2014   Procedure: LEFT AND RIGHT HEART CATHETERIZATION WITH CORONARY ANGIOGRAM;  Surgeon: [name], MD;  Location: [facility] CATH LAB;  Service: Cardiovascular;  Laterality: N/A;  . LEFT HEART CATH AND CORONARY ANGIOGRAPHY N/A 04/14/2018   Procedure: LEFT HEART CATH AND CORONARY ANGIOGRAPHY;  Surgeon: [name], MD;  Location: [place] CV LAB;  Service: Cardiovascular;  Laterality: N/A;  . TEE WITHOUT CARDIOVERSION N/A 08/02/2015   Procedure: TRANSESOPHAGEAL ECHOCARDIOGRAM (TEE);  Surgeon: [name], MD;  Location: [place];  Service: Open Heart Surgery;  Laterality: N/A;  . TONSILLECTOMY    . TRANSCATHETER AORTIC VALVE REPLACEMENT, TRANSFEMORAL Left 08/02/2015   Procedure: TRANSCATHETER AORTIC VALVE REPLACEMENT, TRANSFEMORAL;  Surgeon: [name], MD;  Location: [place];  Service: Open Heart Surgery;  Laterality: Left;    Current Outpatient Medications  Medication Sig Dispense Refill  . albuterol (PROVENTIL HFA;VENTOLIN HFA) 108 (90 Base) MCG/ACT inhaler Inhale 2 puffs into the lungs every 6 (six) hours as needed for wheezing or shortness of breath. 1 Inhaler 2  . aspirin EC 81 MG tablet Take 1 tablet (81 mg total) by mouth daily. 90 tablet 3  . atorvastatin (LIPITOR) 40 MG tablet Take 2 tablets (80 mg total) by mouth daily. 1 tablet 0  . cinacalcet (SENSIPAR) 30 MG tablet Take 30 mg by mouth daily with supper.     . clopidogrel (PLAVIX) 75 MG tablet Take 1 tablet (75 mg total) by mouth daily. 90 tablet 3  . insulin detemir (LEVEMIR) 100 UNIT/ML injection Inject 0.5 mLs (50 Units total) into the skin at bedtime. 10 mL 2  . lidocaine-prilocaine (EMLA) cream Apply 1 application topically once as needed (prior to accessing port).     . metoprolol tartrate (LOPRESSOR) 25 MG tablet Take 1 tablet (25 mg total) by mouth 2 (two) times daily. 180 tablet 3  . nitroGLYCERIN (NITROSTAT) 0.4 MG SL tablet Place 1 tablet (0.4 mg total) under the tongue every 5 (five) minutes as needed for chest pain. 30 tablet 12  . ranitidine (ZANTAC) 150 MG tablet Take 150 mg by mouth 2 (two) times daily as needed for heartburn.    . Triamcinolone Acetonide (TRIAMCINOLONE 0.1 % CREAM : EUCERIN) CREA Apply 1 application topically 3 (three) times daily as needed. Apply to arms, back, and other areas with active rash/itching 1 each 1   No current facility-administered medications for this visit.  Allergies  Allergen Reactions  . Byetta 10 Mcg Pen [Exenatide] Diarrhea and Nausea And Vomiting  . Codeine Itching  . Coumadin [Warfarin Sodium] Rash    Review of Systems negative except from HPI and PMH  Physical Exam BP (!) 118/50   Pulse 66   Ht 5\' 11"  (1.803 m)   Wt 235 lb (106.6 kg)   SpO2 94%   BMI 32.78 kg/m  Well developed and nourished in no acute distress HENT normal Neck supple with JVP-flat Clear Regular rate and rhythm, 2/6 systolic murmur Graft in left arm Abd-soft with active BS No Clubbing cyanosis edema Skin-warm and dry A & Oriented  Grossly normal sensory and motor function   ECG was ordered today AV pacing     Assessment and  Plan  Complete heart block    Coronary artery disease two-vessel PCI 5/19  Pacemaker Biotronik  The patient's device was interrogated.  The information was reviewed. No changes were made in the programming.     Aortic stenosis s/p TAVR  Ventricular  Tachycardia  End-stage renal disease  Atrial fibrillation-flutter  Persistent    Holding sinus   No bleeding  Without symptoms of ischemia

## 2018-05-07 NOTE — Patient Instructions (Signed)
Medication Instructions:  Your physician recommends that you continue on your current medications as directed. Please refer to the Current Medication list given to you today.  Labwork: None ordered.  Testing/Procedures: None ordered.  Follow-Up: Your physician wants you to follow-up in: One Year with Dr Caryl Comes. You will receive a reminder letter in the mail two months in advance. If you don't receive a letter, please call our office to schedule the follow-up appointment.  Remote monitoring is used to monitor your Pacemaker from home. This monitoring reduces the number of office visits required to check your device to one time per year. It allows [place] to keep an eye on the functioning of your device to ensure it is working properly. You are scheduled for a device check from home on 07/31/2018. You may send your transmission at any time that day. If you have a wireless device, the transmission will be sent automatically. After your physician reviews your transmission, you will receive a postcard with your next transmission date.    Any Other Special Instructions Will Be Listed Below (If Applicable).     If you need a refill on your cardiac medications before your next appointment, please call your pharmacy.

## 2018-05-22 ENCOUNTER — Telehealth: Payer: Self-pay | Admitting: Internal Medicine

## 2018-05-22 NOTE — Telephone Encounter (Signed)
Have attempted to return call x3. Busy signal picks up.

## 2018-05-22 NOTE — Telephone Encounter (Signed)
Pt's wife called and would like to know if there is a medication pt can take for his cough and congestion that will not interfere with his other medications.

## 2018-05-26 ENCOUNTER — Telehealth: Payer: Self-pay | Admitting: Internal Medicine

## 2018-05-26 NOTE — Telephone Encounter (Signed)
ATC x4- received busy signal.  Will call back.

## 2018-05-27 ENCOUNTER — Encounter: Payer: Self-pay | Admitting: Pulmonary Disease

## 2018-05-27 ENCOUNTER — Ambulatory Visit (INDEPENDENT_AMBULATORY_CARE_PROVIDER_SITE_OTHER)
Admission: RE | Admit: 2018-05-27 | Discharge: 2018-05-27 | Disposition: A | Discharge status: Home / Self Care | Payer: Medicare Other | Source: Ambulatory Visit | Attending: Pulmonary Disease | Admitting: Pulmonary Disease

## 2018-05-27 ENCOUNTER — Ambulatory Visit: Payer: Medicare Other | Admitting: Pulmonary Disease

## 2018-05-27 VITALS — BP 124/68 | HR 71 | Ht 71.0 in | Wt 236.0 lb

## 2018-05-27 DIAGNOSIS — J9621 Acute and chronic respiratory failure with hypoxia: Secondary | ICD-10-CM

## 2018-05-27 DIAGNOSIS — Z992 Dependence on renal dialysis: Secondary | ICD-10-CM | POA: Diagnosis not present

## 2018-05-27 DIAGNOSIS — E669 Obesity, unspecified: Secondary | ICD-10-CM

## 2018-05-27 DIAGNOSIS — N186 End stage renal disease: Secondary | ICD-10-CM | POA: Diagnosis not present

## 2018-05-27 DIAGNOSIS — J441 Chronic obstructive pulmonary disease with (acute) exacerbation: Secondary | ICD-10-CM | POA: Diagnosis not present

## 2018-05-27 MED ORDER — PREDNISONE 10 MG PO TABS: ORAL_TABLET | ORAL | 0 refills | Status: DC

## 2018-05-27 MED ORDER — DOXYCYCLINE HYCLATE 100 MG PO TABS: 100.0000 mg | ORAL_TABLET | Freq: Two times a day (BID) | ORAL | 0 refills | Status: DC

## 2018-05-27 NOTE — Telephone Encounter (Signed)
Pt wife is calling back [phone]

## 2018-05-27 NOTE — Assessment & Plan Note (Signed)
Continue to work towards healthy weight 

## 2018-05-27 NOTE — Telephone Encounter (Signed)
Called and spoke with patient's wife regarding pt's breathing worsen. They rec'd call from [place] with [facility] needing new RX for O2 by 05/30/18. If [facility] does not receive new RX for O2 for pt, they will p/u the O2 Friday 05/30/18. Patient was last seen by [facility] 10/13/17 had qualifying walk, did not desat at that [place].  Pt states breathing has worsen in last several weeks, requesting be seen in office this week. Schedule appt with B. Mack today at 4:30pm. Nothing further needed at this time.

## 2018-05-27 NOTE — Assessment & Plan Note (Signed)
Walk today in office  Doxycycline >>> 1 100 mg tablet every 12 hours for 7 days >>>take with food  >>>wear sunscreen   Prednisone 10mg  tablet  >>>4 tabs for 2 days, then 3 tabs for 2 days, 2 tabs for 2 days, then 1 tab for 2 days, then stop >>>take with food   Chest x-ray today  Follow-up in 4 weeks with Dr. Melvyn Novas or an APP   Overnight oximetry order placed urgently via [place]

## 2018-05-27 NOTE — Patient Instructions (Addendum)
Walk today in office  Doxycycline >>> 1 100 mg tablet every 12 hours for 7 days >>>take with food  >>>wear sunscreen   Prednisone 10mg  tablet  >>>4 tabs for 2 days, then 3 tabs for 2 days, 2 tabs for 2 days, then 1 tab for 2 days, then stop >>>take with food   Chest x-ray today  Follow-up in 4 weeks with Dr. Melvyn Novas or an APP   Overnight oximetry order placed urgently via [place]    Please contact the office if your symptoms worsen or you have concerns that you are not improving.   Thank you for choosing [facility] for your healthcare, and for allowing [place] to partner with you on your healthcare journey. I am thankful to be able to provide care to you today.   Wyn Quaker FNP-C

## 2018-05-27 NOTE — Assessment & Plan Note (Signed)
Continue dialysis

## 2018-05-27 NOTE — Assessment & Plan Note (Signed)
Walk today in office-did not qualify for exertional oxygen Overnight oximetry placed to be completed by [facility] We will follow-up with [facility] to see if patient can continue to use oxygen while we are doing this work-up.

## 2018-05-27 NOTE — Telephone Encounter (Signed)
Patient's wife returned call.  Call was disconnected before I could get her call back number.  I tried to call her back on number on file but phone was busy.

## 2018-05-27 NOTE — Telephone Encounter (Signed)
Attempted to call pt multiple times but each time received a busy signal.  Will try to call back later.

## 2018-05-27 NOTE — Progress Notes (Signed)
@Patient  ID: Jeffrey [name], male    DOB: 04/03/1941, 77 y.o.   MRN: [id number]  Chief Complaint  Patient presents with  . Follow-up    ER 5/20 for chest pain and SOB. Cough with yellow thick phlegm. Increased SOB with wheezing  x 1 week. States he has to stop his dialysis treaments due to his wheezing.  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  Former patient of Dr. Melvyn Novas.  Recent [facility] Encounters:   09/26/17-office visit- Wert 76-yowm obese s/p smoking cessation 1985  with history of end-stage renal disease on hemodialysis, COPD GOLD 0    diabetes mellitus, aortic stenosis s/p TAVR, ICD implantation. He was admitted in April 2018 for acute respiratory failure, COPD exacerbation in setting of parainfluenza virus. He was treated with Solu-Medrol, doonebs, Mucinex, doxycycline with improvement in symptoms.   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 Smoking history: 60 pack year smoking history. Quit in 1983.  09/26/2017  f/u ov/Wert re:  COPD 0  No need for inhalers in months      Chief Complaint  Patient presents with  . Pulmonary Consult    Referred back by Dr Buelah Manis- needs pulm clearance for kidney transplant. Pt states his breathing is "great" and he has not respiratory problems.   Mostly sleeps on R side horizontally / one pillow no 02  No cough / congestion in am Able to do shopping at [facility]  ok/ walk downhill mb and back to house x about 150 ft qod s  Limiting sob/ cp / claudication No maint or prns now   No obvious day to day or daytime variability or assoc excess/ purulent sputum or mucus plugs or hemoptysis or cp or  chest tightness, subjective wheeze or overt sinus or hb symptoms. No unusual exp hx or h/o childhood pna/ asthma or knowledge of premature birth.  Sleeping ok flat without nocturnal  or early am exacerbation  of respiratory  c/o's or need for noct saba. Also denies any obvious fluctuation of symptoms with weather or environmental changes or other aggravating or alleviating factors except as outlined above         05/27/18  OV Pleasant 77 year old patient seen in office today.  Patient of Dr. Melvyn Novas, patient reporting that when he had a heart attack in [facility] couple months ago he was discharged with oxygen.  Patient has been using 3 to 4 L of oxygen with exertion via [facility] since then.  Patient reports that [facility] called him and told him that he needed a local order or his oxygen was can be discontinued.  Patient reporting today to qualify for oxygen.  Patient completed dialysis today.  Patient reports he is on a 4-day a week schedule.  Patient has had follow-up with cardiology Dr. Caryl Comes saw him 2 weeks ago.  Patient reporting that he has had increased coughing, with yellow sputum.  Feels that he needs oxygen at night.  Has never been formally tested.  Or had any formal home sleep study.  Patient reports he was attempted to be placed on CPAP in the hospital but failed this therapy.  Did not like it and felt very  claustrophobic.    Allergies  Allergen Reactions  . Byetta 10 Mcg Pen [Exenatide] Diarrhea and Nausea And Vomiting  . Codeine Itching  . Coumadin [Warfarin Sodium] Rash    Immunization History  Administered Date(s) Administered  . Influenza Split 08/31/2013  . Influenza, High Dose Seasonal PF 11/21/2016, 09/11/2017  . Pneumococcal Conjugate-13 05/27/2015  . Pneumococcal Polysaccharide-23 05/03/2015, 12/05/2016  . Pneumococcal-Unspecified 05/27/2015    Past Medical History:  Diagnosis Date  . Anginal pain ([place])   . Aortic stenosis    a. severe by echo  09/2014  . Atrial fibrillation ([place])    a. not well documented, not on anticoagulation  . CHF (congestive heart failure) ([place])    04/28/17 echo-EF 40%, mod LVH, diastolic dysfunction  . Claustrophobia   . Complete heart block ([place])   . COPD (chronic obstructive pulmonary disease) ([place])   . Coronary artery disease    a. chronically occluded RCA per cath 09/2014 with collaterals B. cath 05/01/17 chr occ RCA w/collaterals, 60-70% mid LAD,   . CVA (cerebral vascular accident) ([place]) 10/2014   denies residual on 07/11/2015  . ESRD (end stage renal disease) on dialysis [facility])    a. on dialysis; [facility]; MWF, LUE fistula (07/11/2015)  . History of blood transfusion    "related to gallbladder OR"  . History of stomach ulcers   . Hyperlipidemia   . Hypertension   . Iron deficiency anemia   . Myocardial infarction ([place]) 10/2014  . Peripheral vascular disease ([place])   . Pneumonia   . Presence of permanent cardiac pacemaker   . 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 Type II diabetes mellitus ([facility])     Tobacco History: Social History   Tobacco Use  Smoking Status Former Smoker  . Packs/day: 2.00  . Years: 32.00  . Pack years: 64.00  . Last attempt to quit: 11/27/1983  . Years since quitting: 34.5  Smokeless Tobacco Never Used   Counseling given: Yes Continue to not smoke.   Outpatient Encounter Medications as of 05/27/2018  Medication Sig  . albuterol (PROVENTIL HFA;VENTOLIN HFA) 108 (90 Base) MCG/ACT inhaler Inhale 2 puffs into the lungs every 6 (six) hours as needed for wheezing or shortness of breath.  [name] aspirin EC 81 MG tablet Take 1 tablet (81 mg total) by mouth daily.  [name] atorvastatin (LIPITOR) 40 MG tablet Take 2 tablets (80 mg total) by mouth daily.  . cinacalcet (SENSIPAR) 30 MG tablet Take 30 mg by mouth daily with supper.  . clopidogrel (PLAVIX) 75 MG tablet  Take 1 tablet (75 mg total) by mouth daily.  . insulin detemir (LEVEMIR) 100 UNIT/ML injection Inject 0.5 mLs (50 Units total) into the skin at bedtime.  . lidocaine-prilocaine (EMLA) cream Apply 1 application topically once as needed (prior to accessing port).   . metoprolol tartrate (LOPRESSOR) 25 MG tablet Take 1 tablet (25 mg total) by mouth 2 (two) times daily.  . nitroGLYCERIN (NITROSTAT) 0.4 MG SL tablet Place 1 tablet (0.4 mg total) under the tongue every 5 (five) minutes as needed for chest pain.  . ranitidine (ZANTAC) 150 MG tablet Take 150 mg by mouth 2 (two) times daily as needed for heartburn.  . Triamcinolone Acetonide (TRIAMCINOLONE 0.1 % CREAM : EUCERIN) CREA Apply 1 application topically 3 (three) times daily as needed. Apply to arms, back, and other areas with active rash/itching  .  doxycycline (VIBRA-TABS) 100 MG tablet Take 1 tablet (100 mg total) by mouth 2 (two) times daily.  . predniSONE (DELTASONE) 10 MG tablet 4 tabs for 2 days, then 3 tabs for 2 days, 2 tabs for 2 days, then 1 tab for 2 days, then stop   No facility-administered encounter medications on file as of 05/27/2018.      Review of Systems  Constitutional: +fatigue, feels hot occasionally  No  weight loss, night sweats HEENT:   No headaches,  Difficulty swallowing,  Tooth/dental problems, or  Sore throat, No sneezing, itching, ear ache, nasal congestion, post nasal drip  CV: No chest pain,  orthopnea, PND, swelling in lower extremities, anasarca, dizziness, palpitations, syncope  GI: +nausea / vomitting / diarrhea occasionally  No heartburn, indigestion, abdominal pain, change in bowel habits, loss of appetite, bloody stools Resp: +wheezing, productive cough, yellow mucous, sob with exertion and rest  No coughing up of blood.   No chest wall deformity Skin: no rash, lesions, no skin changes. GU: no dysuria, change in color of urine, no urgency or frequency.  No flank pain, no hematuria  MS:  No joint pain or  swelling.  No decreased range of motion.  No back pain. Psych:  No change in mood or affect. No depression or anxiety.  No memory loss.   Physical Exam  BP 124/68   Pulse 71   Ht 5\' 11"  (1.803 m)   Wt 236 lb (107 kg)   SpO2 94%   BMI 32.92 kg/m   Wt Readings from Last 3 Encounters:  05/27/18 236 lb (107 kg)  05/07/18 235 lb (106.6 kg)  04/18/18 248 lb 14.4 oz (112.9 kg)     GEN: A/Ox3; pleasant , NAD, well nourished, chronically ill, on RA     HEENT:  [place]/AT,  EACs-clear, TMs-wnl, NOSE-clear, THROAT- +post nasal drip   NECK:  Supple w/ fair ROM; no JVD;  no lymphadenopathy.    RESP: +inspiratory and expiratory wheezes, air movement in all lobes, deep breaths illcit cough no accessory muscle use, no dullness to percussion  CARD:  RRR, no m/r/g, no peripheral edema, pulses intact, no cyanosis or clubbing.  GI:   Soft & nt; nml bowel sounds; no organomegaly or masses detected.   Musco: Warm bil, no deformities or joint swelling noted.   Neuro: alert, no focal deficits noted.    Skin: Warm, no lesions or rashes, left upper arm graft with bandage   Walk today: Walk today in office patient did not have any drops in his oxygen saturations.  Patient maintained 97% or greater with exertion.   Lab Results:  CBC    Component Value Date/Time   WBC 7.0 04/19/2018 0227   RBC 2.88 (L) 04/19/2018 0227   HGB 8.8 (L) 04/19/2018 0227   HCT 27.1 (L) 04/19/2018 0227   PLT 143 (L) 04/19/2018 0227   MCV 94.1 04/19/2018 0227   MCH 30.6 04/19/2018 0227   MCHC 32.5 04/19/2018 0227   RDW 14.2 04/19/2018 0227   LYMPHSABS 2.2 03/22/2017 0502   MONOABS 1.0 03/22/2017 0502   EOSABS 0.2 03/22/2017 0502   BASOSABS 0.0 03/22/2017 0502    BMET    Component Value Date/Time   NA 134 (L) 04/19/2018 0227   K 4.5 04/19/2018 0227   CL 94 (L) 04/19/2018 0227   CO2 26 04/19/2018 0227   GLUCOSE 334 (H) 04/19/2018 0227   BUN 17 04/19/2018 0227   CREATININE 3.18 (H) 04/19/2018 5784  CALCIUM 8.0 (L) 04/19/2018 0227   GFRNONAA 17 (L) 04/19/2018 0227   GFRAA 20 (L) 04/19/2018 0227    BNP    Component Value Date/Time   BNP 159.6 (H) 11/14/2017 1238    ProBNP No results found for: PROBNP  Imaging: No results found.   Assessment & Plan:   77 year old patient seen in office today.  Patient did not qualify for oxygen with walk today.  Will order overnight oximetry to see if he needs oxygen when sleeping.  Patient feels that he needs 3 to 4 L at night.  Will contact [facility] and see how quickly they can obtain this test.  As patient is reporting that they are going to take his oxygen on 05/30/2018.  Patient also with probable COPD exacerbation.  Will treat with doxycycline, prednisone, and chest x-ray today.  Patient to follow-up in 4 weeks.  Patient reports he is unable to follow-up sooner than that as financially it is difficult for him to afford the co-pays to see our office.   Discussed with pt need to follow up with these symptoms:  Note your daily symptoms > remember "red flags" for COPD:   >>>Increase in cough >>>increase in sputum production >>>increase in shortness of breath or activity  intolerance.   If you notice these symptoms, please call the office to be seen.    Emphasized the importance of patient continuing to complete dialysis treatments, see cardiology, seen primary care, and if symptoms worsen or not improving or he feels like he is short of breath needs oxygen and does not have oxygen he needs to present to the emergency room to be evaluated.  COPD with acute exacerbation ([place]) Walk today in office  Doxycycline >>> 1 100 mg tablet every 12 hours for 7 days >>>take with food  >>>wear sunscreen   Prednisone 10mg  tablet  >>>4 tabs for 2 days, then 3 tabs for 2 days, 2 tabs for 2 days, then 1 tab for 2 days, then stop >>>take with food   Chest x-ray today  Follow-up in 4 weeks with Dr. Melvyn Novas or an APP   Overnight oximetry order placed  urgently via [facility]   Acute on chronic respiratory failure with hypoxia ([place]) Walk today in office-did not qualify for exertional oxygen Overnight oximetry placed to be completed by [facility] We will follow-up with [facility] to see if patient can continue to use oxygen while we are doing this work-up.  Obesity (BMI 30-39.9) Continue to work towards healthy weight  ESRD (end stage renal disease) on dialysis [facility]) Continue dialysis     Lauraine Rinne, NP 05/27/2018

## 2018-05-28 NOTE — Progress Notes (Signed)
Your chest x-ray results of come back.  Showing no acute changes.  No plan of care changes at this time.  Keep follow-up appointment.    Follow-up with our office if symptoms worsen or you do not feel like you are improving under her current regimen.  It was a pleasure taking care of you,  Alexandre Faries, FNP 

## 2018-06-02 NOTE — Progress Notes (Signed)
Chart and office note reviewed in detail  > agree with a/p as outlined    

## 2018-06-03 LAB — CUP PACEART REMOTE DEVICE CHECK
Battery Remaining Percentage: 75 %
Brady Statistic AP VP Percent: 59 %
Brady Statistic AP VS Percent: 0 %
Brady Statistic AS VP Percent: 35 %
Brady Statistic AS VS Percent: 1 %
Implantable Lead Implant Date: 20160815
Implantable Lead Location: 753859
Implantable Lead Model: 5076
Lead Channel Impedance Value: 426 Ohm
Lead Channel Pacing Threshold Amplitude: 0.7 V
Lead Channel Pacing Threshold Pulse Width: 0.4 ms
Lead Channel Pacing Threshold Pulse Width: 0.4 ms
Lead Channel Setting Pacing Amplitude: 2 V
Lead Channel Setting Pacing Pulse Width: 0.4 ms
Lead Channel Setting Sensing Sensitivity: 2 mV
MDC IDC LEAD IMPLANT DT: 20160815
MDC IDC LEAD LOCATION: 753860
MDC IDC MSMT LEADCHNL RA IMPEDANCE VALUE: 396 Ohm
MDC IDC MSMT LEADCHNL RA PACING THRESHOLD AMPLITUDE: 0.8 V
MDC IDC PG IMPLANT DT: 20160815
MDC IDC SESS DTM: 20190709053910
MDC IDC SET LEADCHNL RA PACING AMPLITUDE: 2 V
MDC IDC STAT BRADY RA PERCENT PACED: 61 %
MDC IDC STAT BRADY RV PERCENT PACED: 94 %
Pulse Gen Model: 394929
Pulse Gen Serial Number: 68600861

## 2018-06-09 ENCOUNTER — Telehealth: Payer: Self-pay | Admitting: Internal Medicine

## 2018-06-09 MED ORDER — AMOXICILLIN-POT CLAVULANATE 875-125 MG PO TABS: 1.0000 | ORAL_TABLET | Freq: Two times a day (BID) | ORAL | 0 refills | Status: DC

## 2018-06-09 NOTE — Telephone Encounter (Signed)
Called and spoke with pt's spouse [name] who stated on 7/2 when pt saw Aaron Edelman, pt was prescribed an abx and prednisone.  [name] stated pt was getting better but then once pt was finished with meds and also after going to dialysis, pt's symptoms were back again.  Pt has complaints of cough with green to yellow mucus, wheezing, and SOB.  At this time, pt declines to come in for an appt.  Dr. Melvyn Novas, please advise on recs for pt. Thanks!

## 2018-06-09 NOTE — Telephone Encounter (Signed)
Spoke with the pt and notified of recs per MW  He verbalized understanding  Rx was sent

## 2018-06-09 NOTE — Telephone Encounter (Signed)
Augmentin 875 mg take one pill twice daily  X 10 days - take at breakfast and supper with large glass of water.  It would help reduce the usual side effects (diarrhea and yeast infections) if you ate cultured yogurt at lunch.   If not better p that ov mandatory

## 2018-06-11 ENCOUNTER — Telehealth: Payer: Self-pay | Admitting: Internal Medicine

## 2018-06-11 MED ORDER — PREDNISONE 10 MG PO TABS: ORAL_TABLET | ORAL | 0 refills | Status: DC

## 2018-06-11 NOTE — Telephone Encounter (Signed)
Called and spoke with patient regarding MW recommendations Pt verbalized understanding had no further questions. Placed order prednisone to pharmacy today. Nothing further needed.

## 2018-06-11 NOTE — Telephone Encounter (Signed)
mucinex dm 1200 mg every 12 hours as needed   Also ok to call in Prednisone 10 mg take  4 each am x 2 days,   2 each am x 2 days,  1 each am x 2 days and stop but if not improving by 06/13/18 am needs ov same day, ok to add on to my schedule

## 2018-06-11 NOTE — Telephone Encounter (Signed)
Spoke with pt's wife, [name]. Pt is not feeling any better since calling in on Monday. Still reporting issues with chest congestin and coughing. Advised her of MW's response that he would need an OV if he didn't improve. Pt is refusing appointment. Pt's wife would like to know what she could give him OTC.  MW - please advise. Thanks.

## 2018-06-23 ENCOUNTER — Ambulatory Visit (INDEPENDENT_AMBULATORY_CARE_PROVIDER_SITE_OTHER)
Admission: RE | Admit: 2018-06-23 | Discharge: 2018-06-23 | Disposition: A | Discharge status: Home / Self Care | Payer: Medicare Other | Source: Ambulatory Visit | Attending: Pulmonary Disease | Admitting: Pulmonary Disease

## 2018-06-23 ENCOUNTER — Encounter: Payer: Self-pay | Admitting: Pulmonary Disease

## 2018-06-23 ENCOUNTER — Ambulatory Visit: Payer: Medicare Other | Admitting: Pulmonary Disease

## 2018-06-23 VITALS — BP 118/76 | HR 54 | Temp 97.1°F | Ht 71.0 in | Wt 236.0 lb

## 2018-06-23 DIAGNOSIS — N186 End stage renal disease: Secondary | ICD-10-CM | POA: Diagnosis not present

## 2018-06-23 DIAGNOSIS — Z992 Dependence on renal dialysis: Secondary | ICD-10-CM

## 2018-06-23 DIAGNOSIS — R05 Cough: Secondary | ICD-10-CM | POA: Insufficient documentation

## 2018-06-23 DIAGNOSIS — J309 Allergic rhinitis, unspecified: Secondary | ICD-10-CM | POA: Diagnosis not present

## 2018-06-23 DIAGNOSIS — K219 Gastro-esophageal reflux disease without esophagitis: Secondary | ICD-10-CM | POA: Insufficient documentation

## 2018-06-23 DIAGNOSIS — Z87891 Personal history of nicotine dependence: Secondary | ICD-10-CM

## 2018-06-23 DIAGNOSIS — J449 Chronic obstructive pulmonary disease, unspecified: Secondary | ICD-10-CM | POA: Diagnosis not present

## 2018-06-23 DIAGNOSIS — R059 Cough, unspecified: Secondary | ICD-10-CM

## 2018-06-23 MED ORDER — OMEPRAZOLE 20 MG PO CPDR: 20.0000 mg | DELAYED_RELEASE_CAPSULE | Freq: Every day | ORAL | 4 refills | Status: DC

## 2018-06-23 NOTE — Assessment & Plan Note (Signed)
Chest Xray today   Modified barium swallow to rule out aspiration  Start omeprazole 20 mg in the morning on empty stomach, 1 hour before other medications  Continue zantac 150mg  daily at night  We will hold off on antibiotics and more prednisone at this time. Keep follow-up with Dr. Melvyn Novas on 07/01/2018 Review reflux literature listed below.  Start following a GERD diet.

## 2018-06-23 NOTE — Progress Notes (Signed)
Chest x-ray today showing no acute changes.  Proceed forward with plan of care as discussed at office appointment.  Keep follow-up appointment.  Wyn Quaker, FNP

## 2018-06-23 NOTE — Assessment & Plan Note (Signed)
Start 20 mg omeprazole in the morning Continue Zantac 150 mg at night GERD literature provided for patient review as well as GERD diet

## 2018-06-23 NOTE — Patient Instructions (Addendum)
Chest Xray today    Modified barium swallow to rule out aspiration   Start omeprazole 20 mg in the morning on empty stomach, 1 hour before other medications   Continue zantac 150mg  daily at night   We will hold off on antibiotics and more prednisone at this time.  Keep follow-up with Dr. Melvyn Novas on 07/01/2018  Review reflux literature listed below.  Start following a GERD diet.      Gastroesophageal Reflux Disease, Adult Normally, food travels down the esophagus and stays in the stomach to be digested. If a person has gastroesophageal reflux disease (GERD), food and stomach acid move back up into the esophagus. When this happens, the esophagus becomes sore and swollen (inflamed). Over time, GERD can make small holes (ulcers) in the lining of the esophagus. Follow these instructions at home: Diet  Follow a diet as told by your doctor. You may need to avoid foods and drinks such as: ? Coffee and tea (with or without caffeine). ? Drinks that contain alcohol. ? Energy drinks and sports drinks. ? Carbonated drinks or sodas. ? Chocolate and cocoa. ? Peppermint and mint flavorings. ? Garlic and onions. ? Horseradish. ? Spicy and acidic foods, such as peppers, chili powder, curry powder, vinegar, hot sauces, and BBQ sauce. ? Citrus fruit juices and citrus fruits, such as oranges, lemons, and limes. ? Tomato-based foods, such as red sauce, chili, salsa, and pizza with red sauce. ? Fried and fatty foods, such as donuts, french fries, potato chips, and high-fat dressings. ? High-fat meats, such as hot dogs, rib eye steak, sausage, ham, and bacon. ? High-fat dairy items, such as whole milk, butter, and cream cheese.  Eat small meals often. Avoid eating large meals.  Avoid drinking large amounts of liquid with your meals.  Avoid eating meals during the 2-3 hours before bedtime.  Avoid lying down right after you eat.  Do not exercise right after you eat. General instructions  Pay  attention to any changes in your symptoms.  Take over-the-counter and prescription medicines only as told by your doctor. Do not take aspirin, ibuprofen, or other NSAIDs unless your doctor says it is okay.  Do not use any tobacco products, including cigarettes, chewing tobacco, and e-cigarettes. If you need help quitting, ask your doctor.  Wear loose clothes. Do not wear anything tight around your waist.  Raise (elevate) the head of your bed about 6 inches (15 cm).  Try to lower your stress. If you need help doing this, ask your doctor.  If you are overweight, lose an amount of weight that is healthy for you. Ask your doctor about a safe weight loss goal.  Keep all follow-up visits as told by your doctor. This is important. Contact a doctor if:  You have new symptoms.  You lose weight and you do not know why it is happening.  You have trouble swallowing, or it hurts to swallow.  You have wheezing or a cough that keeps happening.  Your symptoms do not get better with treatment.  You have a hoarse voice. Get help right away if:  You have pain in your arms, neck, jaw, teeth, or back.  You feel sweaty, dizzy, or light-headed.  You have chest pain or shortness of breath.  You throw up (vomit) and your throw up looks like blood or coffee grounds.  You pass out (faint).  Your poop (stool) is bloody or black.  You cannot swallow, drink, or eat. This information is not intended to  replace advice given to you by your health care provider. Make sure you discuss any questions you have with your health care provider. Document Released: 04/30/2008 Document Revised: 04/19/2016 Document Reviewed: 03/09/2015 Elsevier Interactive Patient Education  2018 [place] for Gastroesophageal Reflux Disease, Adult When you have gastroesophageal reflux disease (GERD), the foods you eat and your eating habits are very important. Choosing the right foods can help ease your  discomfort. What guidelines do I need to follow?  Choose fruits, vegetables, whole grains, and low-fat dairy products.  Choose low-fat meat, fish, and poultry.  Limit fats such as oils, salad dressings, butter, nuts, and avocado.  Keep a food diary. This helps you identify foods that cause symptoms.  Avoid foods that cause symptoms. These may be different for everyone.  Eat small meals often instead of 3 large meals a day.  Eat your meals slowly, in a place where you are relaxed.  Limit fried foods.  Cook foods using methods other than frying.  Avoid drinking alcohol.  Avoid drinking large amounts of liquids with your meals.  Avoid bending over or lying down until 2-3 hours after eating. What foods are not recommended? These are some foods and drinks that may make your symptoms worse: Vegetables Tomatoes. Tomato juice. Tomato and spaghetti sauce. Chili peppers. Onion and garlic. Horseradish. Fruits Oranges, grapefruit, and lemon (fruit and juice). Meats High-fat meats, fish, and poultry. This includes hot dogs, ribs, ham, sausage, salami, and bacon. Dairy Whole milk and chocolate milk. Sour cream. Cream. Butter. Ice cream. Cream cheese. Drinks Coffee and tea. Bubbly (carbonated) drinks or energy drinks. Condiments Hot sauce. Barbecue sauce. Sweets/Desserts Chocolate and cocoa. Donuts. Peppermint and spearmint. Fats and Oils High-fat foods. This includes [place] fries and potato chips. Other Vinegar. Strong spices. This includes black pepper, white pepper, red pepper, cayenne, curry powder, cloves, ginger, and chili powder. The items listed above may not be a complete list of foods and drinks to avoid. Contact your dietitian for more information. This information is not intended to replace advice given to you by your health care provider. Make sure you discuss any questions you have with your health care provider. Document Released: 05/13/2012 Document Revised:  04/19/2016 Document Reviewed: 09/16/2013 Elsevier Interactive Patient Education  2017 [employer].

## 2018-06-23 NOTE — Assessment & Plan Note (Signed)
Continue not smoking 

## 2018-06-23 NOTE — Assessment & Plan Note (Signed)
Continue follow-up with dialysis

## 2018-06-23 NOTE — Progress Notes (Signed)
@Patient  ID: [name], male    DOB: 06/27/1941, 77 y.o.   MRN: [id number]  Chief Complaint  Patient presents with  . Acute Visit    worsening cough     Referring provider: Thurman Coyer, MD  HPI: 77 year old male patient with COPD Gold 0, end-stage renal disease on hemodialysis 4 days a week, significant cardiac history.   Former patient of Dr. Melvyn Novas.  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 Smoking history: 60 pack year smoking history. Quit in 1983.   Recent [place] [facility] Encounters:   05/27/18  OV Pleasant 77 year old patient seen in office today.  Patient of Dr. Melvyn Novas, patient reporting that when he had a heart attack in [facility] couple months ago he was discharged with oxygen.  Patient has been using 3 to 4 L of oxygen with exertion via [facility] since then.  Patient reports that [employer] called him and told him that he needed a local order or his oxygen was can be discontinued.  Patient reporting today to qualify for oxygen. Patient completed dialysis today.  Patient reports he is on a 4-day a week schedule. Patient has had follow-up with cardiology Dr. Caryl Comes saw him 2 weeks ago. Patient reporting that he has had increased coughing, with yellow sputum.  Feels that he needs oxygen at night.  Has never been formally tested.  Or had any formal home sleep study.  Patient reports he was attempted to be placed on CPAP in the hospital but failed this therapy.  Did not like it and felt very claustrophobic. Plan: Doxycycline, prednisone, chest x-ray, 4-week follow-up    Tests:  09/26/2017-spirometry-normal spirometry, ratio 76, FEV1 83%  Imaging:  05/27/2018-chest x-ray- bronchitic changes with minimal right basilar atelectasis  Cardiac:  04/17/2018-echocardiogram-LV ejection fraction 45 to 50%, mild regurgitation of mitral valve  Labs:   Micro:   Chart  Review:     06/23/18 OV  77 year old patient seen for follow-up today.  Patient with persistent and worsening cough.  Patient reports this is a dry cough is worsened at night.  Patient has been adherent to end-stage renal dialysis.  Patient has had multiple rounds of antibiotics as well as prednisone without any improvement.   Cough ROS:   When to the symptoms start: 8 weeks / 2 mo ago  How are you today: feel worse today  Have you had fever/sore throat (first 5 to 7 days of URI) : negative, throat hoarse  Have you had cough/nasal congestion (10 to 14 days of URI) : yes to both  Have you used anything to treat the cough, as anything improved: nothing has helped, OTC cough meds nope, mucinex not working   Is it a dry or wet cough: dry, sounds wet  Does the cough happen when your breathing or when you breathe out:  Inspiratory  Other any triggers to your cough, or any aggravating factors: lying down, cough worse after eating, at night   Daily antihistamine: none  GERD treatment: zantac daily  Singulair: none   Cough checklist: Adherence, acid reflux, ACE inhibitor, active/ chronic sinus disease, active smoking, adverse effects of medications (amiodarone/Macrodantin/bb), alpha 1, allergies, aspiration, anxiety, bronchiectasis, congestive heart failure (diastolic)   Allergies  Allergen Reactions  . Byetta 10 Mcg Pen [Exenatide] Diarrhea and Nausea And Vomiting  . Codeine Itching  . Coumadin [Warfarin Sodium] Rash    Immunization History  Administered Date(s) Administered  .  Influenza Split 08/31/2013  . Influenza, High Dose Seasonal PF 11/21/2016, 09/11/2017  . Pneumococcal Conjugate-13 05/27/2015  . Pneumococcal Polysaccharide-23 05/03/2015, 12/05/2016  . Pneumococcal-Unspecified 05/27/2015    Past Medical History:  Diagnosis Date  . Anginal pain ([place])   . Aortic stenosis    a. severe by echo 09/2014  . Atrial fibrillation ([place])    a. not well documented, not on  anticoagulation  . CHF (congestive heart failure) ([place])    04/28/17 echo-EF 40%, mod LVH, diastolic dysfunction  . Claustrophobia   . Complete heart block ([place])   . COPD (chronic obstructive pulmonary disease) ([place])   . Coronary artery disease    a. chronically occluded RCA per cath 09/2014 with collaterals B. cath 05/01/17 chr occ RCA w/collaterals, 60-70% mid LAD,   . CVA (cerebral vascular accident) ([place]) 10/2014   denies residual on 07/11/2015  . ESRD (end stage renal disease) on dialysis [facility])    a. on dialysis; [facility]; MWF, LUE fistula (07/11/2015)  . History of blood transfusion    "related to gallbladder OR"  . History of stomach ulcers   . Hyperlipidemia   . Hypertension   . Iron deficiency anemia   . Myocardial infarction ([place]) 10/2014  . Peripheral vascular disease ([place])   . Pneumonia   . Presence of permanent cardiac pacemaker   . 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 Type II diabetes mellitus ([facility])     Tobacco History: Social History   Tobacco Use  Smoking Status Former Smoker  . Packs/day: 2.00  . Years: 32.00  . Pack years: 64.00  . Last attempt to quit: 11/27/1983  . Years since quitting: 34.5  Smokeless Tobacco Never Used   Counseling given: Yes Continue not smoking.  Outpatient Encounter Medications as of 06/23/2018  Medication Sig  . albuterol (PROVENTIL HFA;VENTOLIN HFA) 108 (90 Base) MCG/ACT inhaler Inhale 2 puffs into the lungs every 6 (six) hours as needed for wheezing or shortness of breath.  [name] amoxicillin-clavulanate (AUGMENTIN) 875-125 MG tablet Take 1 tablet by mouth 2 (two) times daily.  [name] aspirin EC 81 MG tablet Take 1 tablet (81 mg total) by mouth daily.  [name] atorvastatin (LIPITOR) 40 MG tablet Take 2 tablets (80 mg total) by mouth daily.  . cinacalcet (SENSIPAR) 30 MG tablet Take 30 mg by mouth daily with supper.  .  clopidogrel (PLAVIX) 75 MG tablet Take 1 tablet (75 mg total) by mouth daily.  [name] doxycycline (VIBRA-TABS) 100 MG tablet Take 1 tablet (100 mg total) by mouth 2 (two) times daily.  . insulin detemir (LEVEMIR) 100 UNIT/ML injection Inject 0.5 mLs (50 Units total) into the skin at bedtime.  . lidocaine-prilocaine (EMLA) cream Apply 1 application topically once as needed (prior to accessing port).   . metoprolol tartrate (LOPRESSOR) 25 MG tablet Take 1 tablet (25 mg total) by mouth 2 (two) times daily.  . nitroGLYCERIN (NITROSTAT) 0.4 MG SL tablet Place 1 tablet (0.4 mg total) under the tongue every 5 (five) minutes as needed for chest pain.  . predniSONE (DELTASONE) 10 MG tablet 4 tabs for 2 days, then 3 tabs for 2 days, 2 tabs for 2 days, then 1 tab for 2 days, then stop  . predniSONE (DELTASONE) 10 MG tablet Take 10 mg 4 tabs am x 2 days,   2 tabs am x 2 days,  1 tab am x 2  days and stop  . ranitidine (ZANTAC) 150 MG tablet Take 150 mg by mouth 2 (two) times daily as needed for heartburn.  . Triamcinolone Acetonide (TRIAMCINOLONE 0.1 % CREAM : EUCERIN) CREA Apply 1 application topically 3 (three) times daily as needed. Apply to arms, back, and other areas with active rash/itching  . omeprazole (PRILOSEC) 20 MG capsule Take 1 capsule (20 mg total) by mouth daily.   No facility-administered encounter medications on file as of 06/23/2018.     Review of Systems   Constitutional: +chills, no recorded fevers, fatigue  No  weight loss, night sweats HEENT:  +difficultly swallowing, feeling food gets stuck  No headaches,  Tooth/dental problems, or  Sore throat, No sneezing, itching, ear ache, nasal congestion, post nasal drip  CV: +LE swelling No chest pain,  orthopnea, PND,  anasarca, dizziness, palpitations, syncope  GI: +indigestion, heartburn, loss of appetite No abdominal pain, nausea, vomiting, diarrhea, change in bowel habits, bloody stools Resp: +dry cough No shortness of breath with exertion or at  rest.  No excess mucus, no productive cough,  No coughing up of blood.  No change in color of mucus.  No wheezing.  No chest wall deformity Skin: no rash, lesions, no skin changes. GU: no dysuria, change in color of urine, no urgency or frequency.  No flank pain, no hematuria  MS:  No joint pain or swelling.  No decreased range of motion.  No back pain. Psych:  No change in mood or affect. No depression or anxiety.  No memory loss.      Physical Exam  BP 118/76   Pulse (!) 54   Ht 5\' 11"  (1.803 m)   Wt 236 lb (107 kg)   SpO2 94%   BMI 32.92 kg/m   Wt Readings from Last 5 Encounters:  06/23/18 236 lb (107 kg)  05/27/18 236 lb (107 kg)  05/07/18 235 lb (106.6 kg)  04/18/18 248 lb 14.4 oz (112.9 kg)  02/18/18 241 lb (109.3 kg)    Physical Exam  Constitutional: He is oriented to person, place, and time and well-developed, well-nourished, and in no distress. No distress.  HENT:  Head: Normocephalic and atraumatic.  Right Ear: Hearing, tympanic membrane, external ear and ear canal normal.  Left Ear: Hearing, tympanic membrane, external ear and ear canal normal.  Mouth/Throat: Uvula is midline and oropharynx is clear and moist. No oropharyngeal exudate.  Eyes: Pupils are equal, round, and reactive to light.  Neck: Normal range of motion. Neck supple. No JVD present.  Cardiovascular: Normal rate, regular rhythm and normal heart sounds.  Pulmonary/Chest: Effort normal. No accessory muscle usage. No respiratory distress. He has no decreased breath sounds. He has wheezes. He has no rhonchi.  Inspiratory breath causes cough, dry cough on exam  Abdominal: Soft. Bowel sounds are normal. There is no tenderness.  Musculoskeletal: Normal range of motion. He exhibits edema (LE 2+ bilateral ).  Lymphadenopathy:    He has no cervical adenopathy.  Neurological: He is alert and oriented to person, place, and time. Gait normal.  Skin: Skin is warm and dry. He is not diaphoretic. No erythema.    Psychiatric: Mood, memory, affect and judgment normal.  Nursing note and vitals reviewed.    Lab Results:  CBC    Component Value Date/Time   WBC 7.0 04/19/2018 0227   RBC 2.88 (L) 04/19/2018 0227   HGB 8.8 (L) 04/19/2018 0227   HCT 27.1 (L) 04/19/2018 0227   PLT 143 (L) 04/19/2018 6433  MCV 94.1 04/19/2018 0227   MCH 30.6 04/19/2018 0227   MCHC 32.5 04/19/2018 0227   RDW 14.2 04/19/2018 0227   LYMPHSABS 2.2 03/22/2017 0502   MONOABS 1.0 03/22/2017 0502   EOSABS 0.2 03/22/2017 0502   BASOSABS 0.0 03/22/2017 0502    BMET    Component Value Date/Time   NA 134 (L) 04/19/2018 0227   K 4.5 04/19/2018 0227   CL 94 (L) 04/19/2018 0227   CO2 26 04/19/2018 0227   GLUCOSE 334 (H) 04/19/2018 0227   BUN 17 04/19/2018 0227   CREATININE 3.18 (H) 04/19/2018 0227   CALCIUM 8.0 (L) 04/19/2018 0227   GFRNONAA 17 (L) 04/19/2018 0227   GFRAA 20 (L) 04/19/2018 0227    BNP    Component Value Date/Time   BNP 159.6 (H) 11/14/2017 1238    ProBNP No results found for: PROBNP  Imaging: Dg Chest 2 View  Result Date: 05/27/2018 CLINICAL DATA:  Cough, congestion, shortness of breath and low-grade fever for 2.5 weeks, no improvement with antibiotics, history hypertension, diabetes mellitus, atrial fibrillation, coronary artery disease post stenting, CHF EXAM: CHEST - 2 VIEW COMPARISON:  04/15/2018 FINDINGS: RIGHT subclavian transvenous pacemaker leads project at RIGHT atrium and RIGHT ventricle. Enlargement of cardiac silhouette post TAVR. Mediastinal contours and pulmonary vascularity normal. Atherosclerotic calcification aorta. Central peribronchial thickening with minimal RIGHT basilar atelectasis. No acute infiltrate, pleural effusion, or pneumothorax. Bones unremarkable. IMPRESSION: Lodgement of cardiac silhouette post pacemaker and TAVR. Bronchitic changes with minimal RIGHT basilar atelectasis. Electronically Signed   By: Lavonia Dana M.D.   On: 05/27/2018 17:33     Assessment & Plan:    77 year old patient seen office today.  I believe cough is driven by worsening and uncontrolled GERD.  Will start patient on PPI as well as continue H2 blocker at night.  Have concern for potential aspiration.  Will order swallow study.  We will also order chest x-ray as patient has had chills as well as reports that he gets choked on food often.   We will hold off on antibiotics as well as prednisone at this time.  His prednisone will worsen the GERD and patient has been treated with 3 rounds of antibiotics already.  Will base treatment off of chest x-ray.  Close follow-up with our office as recommended, patient to keep follow-up with Dr. Melvyn Novas next week.  GERD (gastroesophageal reflux disease) Start 20 mg omeprazole in the morning Continue Zantac 150 mg at night GERD literature provided for patient review as well as GERD diet   COPD GOLD 0 Chest Xray today    Keep follow-up with Dr. Melvyn Novas on 07/01/2018     ESRD (end stage renal disease) on dialysis [facility]) Continue follow-up with dialysis  History of tobacco use Continue not smoking  Cough Chest Xray today   Modified barium swallow to rule out aspiration  Start omeprazole 20 mg in the morning on empty stomach, 1 hour before other medications  Continue zantac 150mg  daily at night  We will hold off on antibiotics and more prednisone at this time. Keep follow-up with Dr. Melvyn Novas on 07/01/2018 Review reflux literature listed below.  Start following a GERD diet.        Lauraine Rinne, NP 06/23/2018

## 2018-06-23 NOTE — Assessment & Plan Note (Signed)
Chest Xray today    Keep follow-up with Dr. Melvyn Novas on 07/01/2018

## 2018-06-25 ENCOUNTER — Other Ambulatory Visit (HOSPITAL_COMMUNITY): Payer: Self-pay | Admitting: Pulmonary Disease

## 2018-06-25 DIAGNOSIS — R1310 Dysphagia, unspecified: Secondary | ICD-10-CM

## 2018-06-30 ENCOUNTER — Ambulatory Visit (HOSPITAL_COMMUNITY)
Admission: RE | Admit: 2018-06-30 | Discharge: 2018-06-30 | Disposition: A | Discharge status: Home / Self Care | Payer: Medicare Other | Source: Ambulatory Visit | Attending: Pulmonary Disease | Admitting: Pulmonary Disease

## 2018-06-30 ENCOUNTER — Ambulatory Visit (HOSPITAL_COMMUNITY): Payer: Medicare Other

## 2018-06-30 DIAGNOSIS — K219 Gastro-esophageal reflux disease without esophagitis: Secondary | ICD-10-CM

## 2018-06-30 NOTE — Progress Notes (Signed)
Chart and office note reviewed in detail  > agree with a/p as outlined    

## 2018-07-01 ENCOUNTER — Ambulatory Visit: Payer: Medicare Other | Admitting: Internal Medicine

## 2018-07-26 ENCOUNTER — Inpatient Hospital Stay (HOSPITAL_COMMUNITY)
Admission: EM | Admit: 2018-07-26 | Discharge: 2018-07-28 | DRG: 190 | Disposition: A | Discharge status: Home Health | Payer: Medicare Other | Attending: Internal Medicine | Admitting: Internal Medicine

## 2018-07-26 ENCOUNTER — Other Ambulatory Visit: Payer: Self-pay

## 2018-07-26 ENCOUNTER — Encounter (HOSPITAL_COMMUNITY): Payer: Self-pay | Admitting: Emergency Medicine

## 2018-07-26 DIAGNOSIS — N2581 Secondary hyperparathyroidism of renal origin: Secondary | ICD-10-CM | POA: Diagnosis present

## 2018-07-26 DIAGNOSIS — Z87891 Personal history of nicotine dependence: Secondary | ICD-10-CM

## 2018-07-26 DIAGNOSIS — I5032 Chronic diastolic (congestive) heart failure: Secondary | ICD-10-CM | POA: Diagnosis present

## 2018-07-26 DIAGNOSIS — E1165 Type 2 diabetes mellitus with hyperglycemia: Secondary | ICD-10-CM | POA: Diagnosis present

## 2018-07-26 DIAGNOSIS — I252 Old myocardial infarction: Secondary | ICD-10-CM

## 2018-07-26 DIAGNOSIS — R0602 Shortness of breath: Secondary | ICD-10-CM

## 2018-07-26 DIAGNOSIS — I350 Nonrheumatic aortic (valve) stenosis: Secondary | ICD-10-CM | POA: Diagnosis present

## 2018-07-26 DIAGNOSIS — Z961 Presence of intraocular lens: Secondary | ICD-10-CM | POA: Diagnosis present

## 2018-07-26 DIAGNOSIS — Z7902 Long term (current) use of antithrombotics/antiplatelets: Secondary | ICD-10-CM

## 2018-07-26 DIAGNOSIS — R079 Chest pain, unspecified: Secondary | ICD-10-CM

## 2018-07-26 DIAGNOSIS — K219 Gastro-esophageal reflux disease without esophagitis: Secondary | ICD-10-CM | POA: Diagnosis present

## 2018-07-26 DIAGNOSIS — Z8711 Personal history of peptic ulcer disease: Secondary | ICD-10-CM

## 2018-07-26 DIAGNOSIS — Z7982 Long term (current) use of aspirin: Secondary | ICD-10-CM

## 2018-07-26 DIAGNOSIS — Z953 Presence of xenogenic heart valve: Secondary | ICD-10-CM

## 2018-07-26 DIAGNOSIS — E1122 Type 2 diabetes mellitus with diabetic chronic kidney disease: Secondary | ICD-10-CM | POA: Diagnosis present

## 2018-07-26 DIAGNOSIS — Z9842 Cataract extraction status, left eye: Secondary | ICD-10-CM

## 2018-07-26 DIAGNOSIS — Z888 Allergy status to other drugs, medicaments and biological substances status: Secondary | ICD-10-CM

## 2018-07-26 DIAGNOSIS — Z885 Allergy status to narcotic agent status: Secondary | ICD-10-CM

## 2018-07-26 DIAGNOSIS — Z79899 Other long term (current) drug therapy: Secondary | ICD-10-CM

## 2018-07-26 DIAGNOSIS — Z8249 Family history of ischemic heart disease and other diseases of the circulatory system: Secondary | ICD-10-CM

## 2018-07-26 DIAGNOSIS — N186 End stage renal disease: Secondary | ICD-10-CM | POA: Diagnosis present

## 2018-07-26 DIAGNOSIS — Z9049 Acquired absence of other specified parts of digestive tract: Secondary | ICD-10-CM

## 2018-07-26 DIAGNOSIS — Z8673 Personal history of transient ischemic attack (TIA), and cerebral infarction without residual deficits: Secondary | ICD-10-CM

## 2018-07-26 DIAGNOSIS — Z6833 Body mass index (BMI) 33.0-33.9, adult: Secondary | ICD-10-CM

## 2018-07-26 DIAGNOSIS — Z9841 Cataract extraction status, right eye: Secondary | ICD-10-CM

## 2018-07-26 DIAGNOSIS — E1151 Type 2 diabetes mellitus with diabetic peripheral angiopathy without gangrene: Secondary | ICD-10-CM | POA: Diagnosis present

## 2018-07-26 DIAGNOSIS — Z7952 Long term (current) use of systemic steroids: Secondary | ICD-10-CM

## 2018-07-26 DIAGNOSIS — I132 Hypertensive heart and chronic kidney disease with heart failure and with stage 5 chronic kidney disease, or end stage renal disease: Secondary | ICD-10-CM | POA: Diagnosis present

## 2018-07-26 DIAGNOSIS — Z794 Long term (current) use of insulin: Secondary | ICD-10-CM

## 2018-07-26 DIAGNOSIS — E669 Obesity, unspecified: Secondary | ICD-10-CM | POA: Diagnosis present

## 2018-07-26 DIAGNOSIS — D631 Anemia in chronic kidney disease: Secondary | ICD-10-CM | POA: Diagnosis present

## 2018-07-26 DIAGNOSIS — F40240 Claustrophobia: Secondary | ICD-10-CM | POA: Diagnosis present

## 2018-07-26 DIAGNOSIS — J441 Chronic obstructive pulmonary disease with (acute) exacerbation: Principal | ICD-10-CM | POA: Diagnosis present

## 2018-07-26 DIAGNOSIS — Z833 Family history of diabetes mellitus: Secondary | ICD-10-CM

## 2018-07-26 DIAGNOSIS — M1990 Unspecified osteoarthritis, unspecified site: Secondary | ICD-10-CM | POA: Diagnosis present

## 2018-07-26 DIAGNOSIS — R0603 Acute respiratory distress: Secondary | ICD-10-CM | POA: Diagnosis not present

## 2018-07-26 DIAGNOSIS — E785 Hyperlipidemia, unspecified: Secondary | ICD-10-CM | POA: Diagnosis present

## 2018-07-26 DIAGNOSIS — I255 Ischemic cardiomyopathy: Secondary | ICD-10-CM | POA: Diagnosis present

## 2018-07-26 DIAGNOSIS — M898X9 Other specified disorders of bone, unspecified site: Secondary | ICD-10-CM | POA: Diagnosis present

## 2018-07-26 DIAGNOSIS — I2510 Atherosclerotic heart disease of native coronary artery without angina pectoris: Secondary | ICD-10-CM | POA: Diagnosis present

## 2018-07-26 DIAGNOSIS — Z95810 Presence of automatic (implantable) cardiac defibrillator: Secondary | ICD-10-CM

## 2018-07-26 DIAGNOSIS — Z955 Presence of coronary angioplasty implant and graft: Secondary | ICD-10-CM

## 2018-07-26 DIAGNOSIS — I442 Atrioventricular block, complete: Secondary | ICD-10-CM | POA: Diagnosis present

## 2018-07-26 DIAGNOSIS — Z8701 Personal history of pneumonia (recurrent): Secondary | ICD-10-CM

## 2018-07-26 DIAGNOSIS — Z992 Dependence on renal dialysis: Secondary | ICD-10-CM

## 2018-07-26 LAB — CBC
HEMATOCRIT: 32.4 % — AB (ref 39.0–52.0)
Hemoglobin: 10.2 g/dL — ABNORMAL LOW (ref 13.0–17.0)
MCH: 29.3 pg (ref 26.0–34.0)
MCHC: 31.5 g/dL (ref 30.0–36.0)
MCV: 93.1 fL (ref 78.0–100.0)
PLATELETS: 261 10*3/uL (ref 150–400)
RBC: 3.48 MIL/uL — ABNORMAL LOW (ref 4.22–5.81)
RDW: 14.8 % (ref 11.5–15.5)
WBC: 10.5 10*3/uL (ref 4.0–10.5)

## 2018-07-26 NOTE — ED Triage Notes (Addendum)
Co sob and L sided chest pain since 6pm.  Pt wears 3 liters home O2.  States he took ASA pta.  Also reports lower jaw pain.

## 2018-07-27 ENCOUNTER — Other Ambulatory Visit: Payer: Self-pay

## 2018-07-27 ENCOUNTER — Emergency Department (HOSPITAL_COMMUNITY): Payer: Medicare Other

## 2018-07-27 DIAGNOSIS — I255 Ischemic cardiomyopathy: Secondary | ICD-10-CM | POA: Diagnosis present

## 2018-07-27 DIAGNOSIS — I132 Hypertensive heart and chronic kidney disease with heart failure and with stage 5 chronic kidney disease, or end stage renal disease: Secondary | ICD-10-CM | POA: Diagnosis present

## 2018-07-27 DIAGNOSIS — F40240 Claustrophobia: Secondary | ICD-10-CM | POA: Diagnosis present

## 2018-07-27 DIAGNOSIS — N186 End stage renal disease: Secondary | ICD-10-CM | POA: Diagnosis present

## 2018-07-27 DIAGNOSIS — K219 Gastro-esophageal reflux disease without esophagitis: Secondary | ICD-10-CM | POA: Diagnosis present

## 2018-07-27 DIAGNOSIS — E1151 Type 2 diabetes mellitus with diabetic peripheral angiopathy without gangrene: Secondary | ICD-10-CM | POA: Diagnosis present

## 2018-07-27 DIAGNOSIS — M1990 Unspecified osteoarthritis, unspecified site: Secondary | ICD-10-CM | POA: Diagnosis present

## 2018-07-27 DIAGNOSIS — M898X9 Other specified disorders of bone, unspecified site: Secondary | ICD-10-CM | POA: Diagnosis present

## 2018-07-27 DIAGNOSIS — E1122 Type 2 diabetes mellitus with diabetic chronic kidney disease: Secondary | ICD-10-CM

## 2018-07-27 DIAGNOSIS — Z992 Dependence on renal dialysis: Secondary | ICD-10-CM | POA: Diagnosis not present

## 2018-07-27 DIAGNOSIS — I442 Atrioventricular block, complete: Secondary | ICD-10-CM | POA: Diagnosis present

## 2018-07-27 DIAGNOSIS — R0603 Acute respiratory distress: Secondary | ICD-10-CM | POA: Diagnosis not present

## 2018-07-27 DIAGNOSIS — I252 Old myocardial infarction: Secondary | ICD-10-CM | POA: Diagnosis not present

## 2018-07-27 DIAGNOSIS — E1165 Type 2 diabetes mellitus with hyperglycemia: Secondary | ICD-10-CM | POA: Diagnosis present

## 2018-07-27 DIAGNOSIS — Z9841 Cataract extraction status, right eye: Secondary | ICD-10-CM | POA: Diagnosis not present

## 2018-07-27 DIAGNOSIS — N2581 Secondary hyperparathyroidism of renal origin: Secondary | ICD-10-CM | POA: Diagnosis present

## 2018-07-27 DIAGNOSIS — I5032 Chronic diastolic (congestive) heart failure: Secondary | ICD-10-CM | POA: Diagnosis present

## 2018-07-27 DIAGNOSIS — I10 Essential (primary) hypertension: Secondary | ICD-10-CM | POA: Diagnosis not present

## 2018-07-27 DIAGNOSIS — D631 Anemia in chronic kidney disease: Secondary | ICD-10-CM | POA: Diagnosis present

## 2018-07-27 DIAGNOSIS — E669 Obesity, unspecified: Secondary | ICD-10-CM | POA: Diagnosis present

## 2018-07-27 DIAGNOSIS — J441 Chronic obstructive pulmonary disease with (acute) exacerbation: Principal | ICD-10-CM

## 2018-07-27 DIAGNOSIS — I2583 Coronary atherosclerosis due to lipid rich plaque: Secondary | ICD-10-CM

## 2018-07-27 DIAGNOSIS — Z6833 Body mass index (BMI) 33.0-33.9, adult: Secondary | ICD-10-CM | POA: Diagnosis not present

## 2018-07-27 DIAGNOSIS — Z794 Long term (current) use of insulin: Secondary | ICD-10-CM

## 2018-07-27 DIAGNOSIS — Z9842 Cataract extraction status, left eye: Secondary | ICD-10-CM | POA: Diagnosis not present

## 2018-07-27 DIAGNOSIS — I2510 Atherosclerotic heart disease of native coronary artery without angina pectoris: Secondary | ICD-10-CM

## 2018-07-27 DIAGNOSIS — E785 Hyperlipidemia, unspecified: Secondary | ICD-10-CM | POA: Diagnosis present

## 2018-07-27 DIAGNOSIS — I350 Nonrheumatic aortic (valve) stenosis: Secondary | ICD-10-CM | POA: Diagnosis present

## 2018-07-27 LAB — CBC
HEMATOCRIT: 29.4 % — AB (ref 39.0–52.0)
HEMOGLOBIN: 9.5 g/dL — AB (ref 13.0–17.0)
MCH: 29.9 pg (ref 26.0–34.0)
MCHC: 32.3 g/dL (ref 30.0–36.0)
MCV: 92.5 fL (ref 78.0–100.0)
Platelets: 219 10*3/uL (ref 150–400)
RBC: 3.18 MIL/uL — ABNORMAL LOW (ref 4.22–5.81)
RDW: 14.7 % (ref 11.5–15.5)
WBC: 9.2 10*3/uL (ref 4.0–10.5)

## 2018-07-27 LAB — CBC WITH DIFFERENTIAL/PLATELET
ABS IMMATURE GRANULOCYTES: 0 10*3/uL (ref 0.0–0.1)
BASOS ABS: 0 10*3/uL (ref 0.0–0.1)
Basophils Relative: 0 %
Eosinophils Absolute: 0 10*3/uL (ref 0.0–0.7)
Eosinophils Relative: 0 %
HCT: 29.7 % — ABNORMAL LOW (ref 39.0–52.0)
HEMOGLOBIN: 9.5 g/dL — AB (ref 13.0–17.0)
Immature Granulocytes: 0 %
LYMPHS ABS: 0.4 10*3/uL — AB (ref 0.7–4.0)
LYMPHS PCT: 5 %
MCH: 29.9 pg (ref 26.0–34.0)
MCHC: 32 g/dL (ref 30.0–36.0)
MCV: 93.4 fL (ref 78.0–100.0)
Monocytes Absolute: 0.2 10*3/uL (ref 0.1–1.0)
Monocytes Relative: 3 %
NEUTROS ABS: 6.5 10*3/uL (ref 1.7–7.7)
Neutrophils Relative %: 92 %
Platelets: 187 10*3/uL (ref 150–400)
RBC: 3.18 MIL/uL — ABNORMAL LOW (ref 4.22–5.81)
RDW: 14.6 % (ref 11.5–15.5)
WBC: 7.2 10*3/uL (ref 4.0–10.5)

## 2018-07-27 LAB — HIV ANTIBODY (ROUTINE TESTING W REFLEX): HIV Screen 4th Generation wRfx: NONREACTIVE

## 2018-07-27 LAB — BASIC METABOLIC PANEL
ANION GAP: 14 (ref 5–15)
Anion gap: 14 (ref 5–15)
BUN: 43 mg/dL — AB (ref 8–23)
BUN: 49 mg/dL — ABNORMAL HIGH (ref 8–23)
CHLORIDE: 97 mmol/L — AB (ref 98–111)
CO2: 21 mmol/L — ABNORMAL LOW (ref 22–32)
CO2: 25 mmol/L (ref 22–32)
Calcium: 8.4 mg/dL — ABNORMAL LOW (ref 8.9–10.3)
Calcium: 8.9 mg/dL (ref 8.9–10.3)
Chloride: 93 mmol/L — ABNORMAL LOW (ref 98–111)
Creatinine, Ser: 4.89 mg/dL — ABNORMAL HIGH (ref 0.61–1.24)
Creatinine, Ser: 5.4 mg/dL — ABNORMAL HIGH (ref 0.61–1.24)
GFR calc Af Amer: 10 mL/min — ABNORMAL LOW (ref >60)
GFR calc Af Amer: 12 mL/min — ABNORMAL LOW (ref >60)
GFR calc non Af Amer: 9 mL/min — ABNORMAL LOW (ref >60)
GFR, EST NON AFRICAN AMERICAN: 10 mL/min — AB (ref >60)
Glucose, Bld: 237 mg/dL — ABNORMAL HIGH (ref 70–99)
Glucose, Bld: 321 mg/dL — ABNORMAL HIGH (ref 70–99)
POTASSIUM: 3.8 mmol/L (ref 3.5–5.1)
POTASSIUM: 3.9 mmol/L (ref 3.5–5.1)
SODIUM: 132 mmol/L — AB (ref 135–145)
Sodium: 132 mmol/L — ABNORMAL LOW (ref 135–145)

## 2018-07-27 LAB — I-STAT TROPONIN, ED: Troponin i, poc: 0.21 ng/mL (ref 0.00–0.08)

## 2018-07-27 LAB — MRSA PCR SCREENING: MRSA by PCR: NEGATIVE

## 2018-07-27 LAB — GLUCOSE, CAPILLARY
GLUCOSE-CAPILLARY: 490 mg/dL — AB (ref 70–99)
GLUCOSE-CAPILLARY: 432 mg/dL — AB (ref 70–99)
GLUCOSE-CAPILLARY: 453 mg/dL — AB (ref 70–99)
Glucose-Capillary: 192 mg/dL — ABNORMAL HIGH (ref 70–99)
Glucose-Capillary: 302 mg/dL — ABNORMAL HIGH (ref 70–99)
Glucose-Capillary: 329 mg/dL — ABNORMAL HIGH (ref 70–99)

## 2018-07-27 LAB — BRAIN NATRIURETIC PEPTIDE: B NATRIURETIC PEPTIDE 5: 2421.8 pg/mL — AB (ref 0.0–100.0)

## 2018-07-27 LAB — TROPONIN I
TROPONIN I: 0.23 ng/mL — AB (ref <0.03)
Troponin I: 0.32 ng/mL (ref <0.03)

## 2018-07-27 LAB — CREATININE, SERUM
CREATININE: 5.18 mg/dL — AB (ref 0.61–1.24)
GFR calc Af Amer: 11 mL/min — ABNORMAL LOW (ref >60)
GFR calc non Af Amer: 10 mL/min — ABNORMAL LOW (ref >60)

## 2018-07-27 MED ORDER — IPRATROPIUM-ALBUTEROL 0.5-2.5 (3) MG/3ML IN SOLN
3.0000 mL | Freq: Four times a day (QID) | RESPIRATORY_TRACT | Status: DC
  Administered 2018-07-27 – 2018-07-28 (×2): 3 mL via RESPIRATORY_TRACT
  Filled 2018-07-27 (×4): qty 3

## 2018-07-27 MED ORDER — IPRATROPIUM-ALBUTEROL 0.5-2.5 (3) MG/3ML IN SOLN: 3.0000 mL | Freq: Two times a day (BID) | RESPIRATORY_TRACT | Status: DC

## 2018-07-27 MED ORDER — METHYLPREDNISOLONE SODIUM SUCC 40 MG IJ SOLR
40.0000 mg | Freq: Every day | INTRAMUSCULAR | Status: DC
  Administered 2018-07-28: 40 mg via INTRAVENOUS
  Filled 2018-07-27: qty 1

## 2018-07-27 MED ORDER — INSULIN DETEMIR 100 UNIT/ML ~~LOC~~ SOLN
40.0000 [IU] | Freq: Every day | SUBCUTANEOUS | Status: DC
  Administered 2018-07-27: 40 [IU] via SUBCUTANEOUS
  Filled 2018-07-27 (×2): qty 0.4

## 2018-07-27 MED ORDER — INSULIN DETEMIR 100 UNIT/ML ~~LOC~~ SOLN: 50.0000 [IU] | Freq: Every day | SUBCUTANEOUS | Status: DC

## 2018-07-27 MED ORDER — CINACALCET HCL 30 MG PO TABS
30.0000 mg | ORAL_TABLET | ORAL | Status: DC
  Administered 2018-07-28: 30 mg via ORAL
  Filled 2018-07-27: qty 1

## 2018-07-27 MED ORDER — SODIUM CHLORIDE 0.9 % IV SOLN
1.0000 g | INTRAVENOUS | Status: DC
  Administered 2018-07-27: 1 g via INTRAVENOUS
  Filled 2018-07-27: qty 10

## 2018-07-27 MED ORDER — METHYLPREDNISOLONE SODIUM SUCC 125 MG IJ SOLR
80.0000 mg | Freq: Once | INTRAMUSCULAR | Status: AC
  Administered 2018-07-27: 80 mg via INTRAVENOUS
  Filled 2018-07-27: qty 2

## 2018-07-27 MED ORDER — INSULIN ASPART 100 UNIT/ML ~~LOC~~ SOLN
0.0000 [IU] | Freq: Three times a day (TID) | SUBCUTANEOUS | Status: DC
  Administered 2018-07-27: 9 [IU] via SUBCUTANEOUS
  Administered 2018-07-27: 7 [IU] via SUBCUTANEOUS

## 2018-07-27 MED ORDER — METOPROLOL SUCCINATE ER 25 MG PO TB24
25.0000 mg | ORAL_TABLET | Freq: Every day | ORAL | Status: DC
  Administered 2018-07-27 – 2018-07-28 (×2): 25 mg via ORAL
  Filled 2018-07-27 (×2): qty 1

## 2018-07-27 MED ORDER — HEPARIN SODIUM (PORCINE) 5000 UNIT/ML IJ SOLN
5000.0000 [IU] | Freq: Three times a day (TID) | INTRAMUSCULAR | Status: DC
  Administered 2018-07-27 – 2018-07-28 (×4): 5000 [IU] via SUBCUTANEOUS
  Filled 2018-07-27 (×3): qty 1

## 2018-07-27 MED ORDER — ATORVASTATIN CALCIUM 80 MG PO TABS
80.0000 mg | ORAL_TABLET | Freq: Every day | ORAL | Status: DC
  Administered 2018-07-27 – 2018-07-28 (×2): 80 mg via ORAL
  Filled 2018-07-27: qty 1
  Filled 2018-07-27: qty 4

## 2018-07-27 MED ORDER — RENA-VITE PO TABS
1.0000 | ORAL_TABLET | Freq: Every day | ORAL | Status: DC
  Administered 2018-07-27: 1 via ORAL
  Filled 2018-07-27 (×2): qty 1

## 2018-07-27 MED ORDER — SODIUM CHLORIDE 0.9 % IV SOLN
500.0000 mg | INTRAVENOUS | Status: DC
  Administered 2018-07-27: 500 mg via INTRAVENOUS
  Filled 2018-07-27: qty 500

## 2018-07-27 MED ORDER — NITROGLYCERIN 0.4 MG SL SUBL
0.4000 mg | SUBLINGUAL_TABLET | SUBLINGUAL | Status: DC | PRN
  Administered 2018-07-27: 0.4 mg via SUBLINGUAL
  Filled 2018-07-27: qty 1

## 2018-07-27 MED ORDER — IPRATROPIUM-ALBUTEROL 0.5-2.5 (3) MG/3ML IN SOLN
3.0000 mL | RESPIRATORY_TRACT | Status: DC
  Administered 2018-07-27 (×2): 3 mL via RESPIRATORY_TRACT
  Filled 2018-07-27 (×2): qty 3

## 2018-07-27 MED ORDER — AZITHROMYCIN 500 MG PO TABS
500.0000 mg | ORAL_TABLET | Freq: Every day | ORAL | Status: DC
  Administered 2018-07-28: 500 mg via ORAL
  Filled 2018-07-27: qty 1

## 2018-07-27 MED ORDER — IPRATROPIUM-ALBUTEROL 0.5-2.5 (3) MG/3ML IN SOLN: 3.0000 mL | RESPIRATORY_TRACT | Status: DC | PRN

## 2018-07-27 MED ORDER — SEVELAMER CARBONATE 800 MG PO TABS
2400.0000 mg | ORAL_TABLET | Freq: Three times a day (TID) | ORAL | Status: DC
  Administered 2018-07-27 – 2018-07-28 (×2): 2400 mg via ORAL
  Filled 2018-07-27 (×2): qty 3

## 2018-07-27 MED ORDER — PANTOPRAZOLE SODIUM 40 MG PO TBEC
40.0000 mg | DELAYED_RELEASE_TABLET | Freq: Every day | ORAL | Status: DC
  Administered 2018-07-27 – 2018-07-28 (×2): 40 mg via ORAL
  Filled 2018-07-27 (×2): qty 1

## 2018-07-27 MED ORDER — ASPIRIN EC 81 MG PO TBEC
81.0000 mg | DELAYED_RELEASE_TABLET | Freq: Every day | ORAL | Status: DC
  Administered 2018-07-27 – 2018-07-28 (×2): 81 mg via ORAL
  Filled 2018-07-27 (×2): qty 1

## 2018-07-27 MED ORDER — FAMOTIDINE 20 MG PO TABS
20.0000 mg | ORAL_TABLET | Freq: Every day | ORAL | Status: DC | PRN
  Administered 2018-07-28: 20 mg via ORAL
  Filled 2018-07-27: qty 1

## 2018-07-27 MED ORDER — SODIUM CHLORIDE 0.9 % IV SOLN: INTRAVENOUS | Status: DC | PRN

## 2018-07-27 MED ORDER — MOMETASONE FURO-FORMOTEROL FUM 200-5 MCG/ACT IN AERO
1.0000 | INHALATION_SPRAY | Freq: Two times a day (BID) | RESPIRATORY_TRACT | Status: DC
  Administered 2018-07-27 (×2): 1 via RESPIRATORY_TRACT
  Filled 2018-07-27: qty 8.8

## 2018-07-27 MED ORDER — TRIAMCINOLONE 0.1 % CREAM:EUCERIN CREAM 1:1
1.0000 "application " | TOPICAL_CREAM | Freq: Three times a day (TID) | CUTANEOUS | Status: DC | PRN
  Filled 2018-07-27: qty 1

## 2018-07-27 MED ORDER — PREDNISONE 20 MG PO TABS
40.0000 mg | ORAL_TABLET | Freq: Every day | ORAL | Status: DC
  Filled 2018-07-27: qty 2

## 2018-07-27 MED ORDER — INSULIN ASPART 100 UNIT/ML ~~LOC~~ SOLN
0.0000 [IU] | Freq: Three times a day (TID) | SUBCUTANEOUS | Status: DC
  Administered 2018-07-27: 15 [IU] via SUBCUTANEOUS
  Administered 2018-07-28: 11 [IU] via SUBCUTANEOUS
  Administered 2018-07-28: 2 [IU] via SUBCUTANEOUS

## 2018-07-27 MED ORDER — CLOPIDOGREL BISULFATE 75 MG PO TABS
75.0000 mg | ORAL_TABLET | Freq: Every day | ORAL | Status: DC
  Administered 2018-07-27 – 2018-07-28 (×2): 75 mg via ORAL
  Filled 2018-07-27 (×2): qty 1

## 2018-07-27 MED ORDER — CINACALCET HCL 30 MG PO TABS: 30.0000 mg | ORAL_TABLET | Freq: Every day | ORAL | Status: DC

## 2018-07-27 MED ORDER — IPRATROPIUM-ALBUTEROL 0.5-2.5 (3) MG/3ML IN SOLN
3.0000 mL | Freq: Once | RESPIRATORY_TRACT | Status: AC
  Administered 2018-07-27: 3 mL via RESPIRATORY_TRACT
  Filled 2018-07-27: qty 3

## 2018-07-27 NOTE — Consult Note (Signed)
[facility] Renal Consultation Note  Indication for Consultation:  Management of ESRD/hemodialysis; anemia, hypertension/volume and secondary hyperparathyroidism  HPI: Jeffrey [name] is a 77 y.o. 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 Roxy Manns (08/2015), 5/20-5/25/19 NSTEMI. Cath multivessel. Turned down for CABG. PCI/stent mid LAD/ostial circ.    Now admitted with Copd exacerbation with  progressive dyspnea and worsening cough despite op Abx/ prednisone taper and home O2. And may have element of volume overload leaving at his edw or under by 1/2 kg last 3 op HD txs. He thinks symptoms started after being "too cold at OP kidney center. "  Denies fevers, wife reports some chills past week ,decre appetite ,and Chest tight when coughing.  CXR shows pulm edema and slight increase in consolidation in RLL and R medial lobe/ K 3.8, hgb 9.5  O2 sat on admit 93% rm air  CE unremarkable , wbc 9.2     Last hd was Friday 07/25/18 .  Currently comfortable with  3 l  O2 /  Eating Lunch sitting up on edge of bed.         Past Medical History:  Diagnosis Date  . Anginal pain ([place])   . Aortic stenosis    a. severe by echo 09/2014  . Atrial fibrillation ([place])    a. not well documented, not on anticoagulation  . CHF (congestive heart failure) ([place])    04/28/17 echo-EF 40%, mod LVH, diastolic dysfunction  . Claustrophobia   . Complete heart block ([place])   . COPD (chronic obstructive pulmonary disease) ([place])   . Coronary artery disease    a. chronically occluded RCA per cath 09/2014 with collaterals B. cath 05/01/17 chr occ RCA w/collaterals, 60-70% mid LAD,   . CVA (cerebral vascular accident) ([place]) 10/2014   denies residual on 07/11/2015  . ESRD (end stage renal disease) on  dialysis [facility]    a. on dialysis; [facility]; MWF, LUE fistula (07/11/2015)  . History of blood transfusion    "related to gallbladder OR"  . History of stomach ulcers   . Hyperlipidemia   . Hypertension   . Iron deficiency anemia   . Myocardial infarction ([place]) 10/2014  . Peripheral vascular disease ([place])   . Pneumonia   . Presence of permanent cardiac pacemaker   . 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 Type II diabetes mellitus ([place])     Past Surgical History:  Procedure Laterality Date  . AV FISTULA PLACEMENT Left 10/19/2014   Procedure: BRACHIOCEPHALIC ARTERIOVENOUS (AV) FISTULA CREATION ;  Surgeon: Conrad [place], MD;  Location: [place];  Service: Vascular;  Laterality: Left;  . CARDIAC CATHETERIZATION    . CARDIAC CATHETERIZATION N/A 07/22/2015   Procedure: Right/Left Heart Cath and Coronary Angiography;  Surgeon: [name], MD;  Location: [place] [facility] LAB;  Service: Cardiovascular;  Laterality: N/A;  . CATARACT EXTRACTION W/ INTRAOCULAR LENS  IMPLANT, BILATERAL Bilateral 1990's  . CHOLECYSTECTOMY OPEN  1980's  . COLONOSCOPY W/ BIOPSIES AND POLYPECTOMY    . CORONARY ANGIOPLASTY    . CORONARY ATHERECTOMY N/A 04/18/2018   Procedure: CORONARY ATHERECTOMY;  Surgeon: [name], MD;  Location: [place] CV LAB;  Service: Cardiovascular;  Laterality: N/A;  . CORONARY STENT INTERVENTION N/A 04/18/2018   Procedure: CORONARY STENT INTERVENTION;  Surgeon: [name], MD;  Location: [place] CV LAB;  Service: Cardiovascular;  Laterality: N/A;  . EP IMPLANTABLE DEVICE N/A 07/11/2015   Procedure: Pacemaker Implant;  Surgeon: [name], MD;  Location: [facility] CV LAB;  Service: Cardiovascular;  Laterality: N/A;  . ESOPHAGOGASTRODUODENOSCOPY  08/01/2012   Procedure: ESOPHAGOGASTRODUODENOSCOPY (EGD);  Surgeon: [name],  MD;  Location: [facility];  Service: Endoscopy;  Laterality: N/A;  . INSERT / REPLACE / REMOVE PACEMAKER  07/11/2015  . INSERTION OF DIALYSIS CATHETER Right 02/02/2015   Procedure: INSERTION OF DIALYSIS CATHETER  RIGHT INTERNAL JUGULAR;  Surgeon: Mal [name], MD;  Location: [facility];  Service: Vascular;  Laterality: Right;  . LEFT AND RIGHT HEART CATHETERIZATION WITH CORONARY ANGIOGRAM N/A 09/30/2014   Procedure: LEFT AND RIGHT HEART CATHETERIZATION WITH CORONARY ANGIOGRAM;  Surgeon: [name], MD;  Location: [facility] CATH LAB;  Service: Cardiovascular;  Laterality: N/A;  . LEFT HEART CATH AND CORONARY ANGIOGRAPHY N/A 04/14/2018   Procedure: LEFT HEART CATH AND CORONARY ANGIOGRAPHY;  Surgeon: [name], MD;  Location: [place] [facility] CV LAB;  Service: Cardiovascular;  Laterality: N/A;  . TEE WITHOUT CARDIOVERSION N/A 08/02/2015   Procedure: TRANSESOPHAGEAL ECHOCARDIOGRAM (TEE);  Surgeon: [name], MD;  Location: [place];  Service: Open Heart Surgery;  Laterality: N/A;  . TONSILLECTOMY    . TRANSCATHETER AORTIC VALVE REPLACEMENT, TRANSFEMORAL Left 08/02/2015   Procedure: TRANSCATHETER AORTIC VALVE REPLACEMENT, TRANSFEMORAL;  Surgeon: [name], MD;  Location: [facility];  Service: Open Heart Surgery;  Laterality: Left;      Family History  Problem Relation Age of Onset  . Diabetes Father   . Heart disease Father   . Diabetes Sister   . Alzheimer's disease Mother       reports that he quit smoking about 34 years ago. He has a 64.00 pack-year smoking history. He has never used smokeless tobacco. He reports that he drinks alcohol. He reports that he does not use drugs.   Allergies  Allergen Reactions  . Byetta 10 Mcg Pen [Exenatide] Diarrhea and Nausea And Vomiting  . Codeine Itching  . Coumadin [Warfarin Sodium] Rash    Prior to Admission medications   Medication Sig Start Date End Date Taking? Authorizing Provider  albuterol (PROVENTIL HFA;VENTOLIN HFA) 108 (90 Base) MCG/ACT inhaler  Inhale 2 puffs into the lungs every 6 (six) hours as needed for wheezing or shortness of breath. 04/19/18  Yes Rai, Ripudeep K, MD  amoxicillin-clavulanate (AUGMENTIN) 875-125 MG tablet Take 1 tablet by mouth 2 (two) times daily. 06/09/18  Yes Tanda Rockers, MD  aspirin EC 81 MG tablet Take 1 tablet (81 mg total) by mouth daily. 05/15/17  Yes Eileen Stanford, PA-C  atorvastatin (LIPITOR) 40 MG tablet Take 2 tablets (80 mg total) by mouth daily. 05/03/17  Yes Allie Bossier, MD  cinacalcet (SENSIPAR) 30 MG tablet Take 30 mg by mouth daily with supper.   Yes [provider]  clopidogrel (PLAVIX) 75 MG tablet Take 1 tablet (75 mg total) by mouth daily. 05/07/18  Yes Deboraha Sprang, MD  doxycycline (VIBRA-TABS) 100 MG tablet Take 1 tablet (100 mg total) by mouth 2 (two) times daily. 05/27/18  Yes Lauraine Rinne, NP  insulin detemir (LEVEMIR) 100 UNIT/ML injection Inject 0.5 mLs (50 Units total) into the skin at bedtime. 04/19/18  Yes Rai, Vernelle Emerald, MD  lidocaine-prilocaine (EMLA) cream Apply 1 application topically once as needed (prior to accessing port).    Yes [provider]  metoprolol tartrate (LOPRESSOR) 25 MG tablet Take 1 tablet (25 mg total) by mouth 2 (two) times daily. 05/07/18  Yes Deboraha Sprang, MD  nitroGLYCERIN (NITROSTAT) 0.4 MG SL tablet Place 1 tablet (0.4 mg total) under the tongue every 5 (five) minutes as needed for chest pain. 04/19/18  Yes Rai, Ripudeep K, MD  omeprazole (PRILOSEC) 20 MG capsule Take 1 capsule (20 mg total) by mouth daily. 06/23/18  Yes Lauraine Rinne, NP  predniSONE (DELTASONE) 10 MG tablet 4 tabs for 2 days, then 3 tabs for 2 days, 2 tabs for 2 days, then 1 tab for 2 days, then stop 05/27/18  Yes Lauraine Rinne, NP  predniSONE (DELTASONE) 10 MG tablet Take 10 mg 4 tabs am x 2 days,   2 tabs am x 2 days,  1 tab am x 2 days and stop 06/11/18  Yes Tanda Rockers, MD  ranitidine (ZANTAC) 150 MG tablet Take 150 mg by mouth 2 (two) times daily as needed for  heartburn.   Yes [provider]  Triamcinolone Acetonide (TRIAMCINOLONE 0.1 % CREAM : EUCERIN) CREA Apply 1 application topically 3 (three) times daily as needed. Apply to arms, back, and other areas with active rash/itching 04/19/18  Yes Rai, Ripudeep K, MD     Anti-infectives (From admission, onward)   Start     Dose/Rate Route Frequency Ordered Stop   07/28/18 1000  azithromycin (ZITHROMAX) tablet 500 mg     500 mg Oral Daily 07/27/18 1221     07/27/18 0145  azithromycin (ZITHROMAX) 500 mg in sodium chloride 0.9 % 250 mL IVPB  Status:  Discontinued     500 mg 250 mL/hr over 60 Minutes Intravenous Every 24 hours 07/27/18 0143 07/27/18 1221   07/27/18 0143  cefTRIAXone (ROCEPHIN) 1 g in sodium chloride 0.9 % 100 mL IVPB  Status:  Discontinued     1 g 200 mL/hr over 30 Minutes Intravenous Every 24 hours 07/27/18 0143 07/27/18 1221      Results for orders placed or performed during the hospital encounter of 07/26/18 (from the past 48 hour(s))  Basic metabolic panel     Status: Abnormal   Collection Time: 07/26/18 11:47 PM  Result Value Ref Range   Sodium 132 (L) 135 - 145 mmol/L   Potassium 3.9 3.5 - 5.1 mmol/L   Chloride 93 (L) 98 - 111 mmol/L   CO2 25 22 - 32 mmol/L   Glucose, Bld 237 (H) 70 - 99 mg/dL   BUN 43 (H) 8 - 23 mg/dL   Creatinine, Ser 4.89 (H) 0.61 - 1.24 mg/dL   Calcium 8.9 8.9 - 10.3 mg/dL   GFR calc non Af Amer 10 (L) >60 mL/min   GFR calc Af Amer 12 (L) >60 mL/min    Comment: (NOTE) The eGFR has been calculated using the CKD EPI equation. This calculation has not been validated in all clinical situations. eGFR's persistently <60 mL/min signify possible Chronic Kidney Disease.    Anion gap 14 5 - 15    Comment: Performed at [facility] [address], [place] [address]  CBC     Status: Abnormal   Collection Time: 07/26/18 11:47 PM  Result Value Ref Range   WBC 10.5 4.0 - 10.5 K/uL   RBC 3.48 (L) 4.22 - 5.81 MIL/uL   Hemoglobin 10.2  (  L) 13.0 - 17.0 g/dL   HCT 32.4 (L) 39.0 - 52.0 %   MCV 93.1 78.0 - 100.0 fL   MCH 29.3 26.0 - 34.0 pg   MCHC 31.5 30.0 - 36.0 g/dL   RDW 14.8 11.5 - 15.5 %   Platelets 261 150 - 400 K/uL    Comment: Performed at [facility] [address], [place], [place] [address]  I-stat troponin, ED     Status: Abnormal   Collection Time: 07/26/18 11:54 PM  Result Value Ref Range   Troponin i, poc 0.21 (HH) 0.00 - 0.08 ng/mL   Comment NOTIFIED PHYSICIAN    Comment 3            Comment: Due to the release kinetics of cTnI, a negative result within the first hours of the onset of symptoms does not rule out myocardial infarction with certainty. If myocardial infarction is still suspected, repeat the test at appropriate intervals.   Brain natriuretic peptide     Status: Abnormal   Collection Time: 07/27/18  1:29 AM  Result Value Ref Range   B Natriuretic Peptide 2,421.8 (H) 0.0 - 100.0 pg/mL    Comment: Performed at [facility] [address], [place], [place] [address]  CBC     Status: Abnormal   Collection Time: 07/27/18  2:06 AM  Result Value Ref Range   WBC 9.2 4.0 - 10.5 K/uL   RBC 3.18 (L) 4.22 - 5.81 MIL/uL   Hemoglobin 9.5 (L) 13.0 - 17.0 g/dL   HCT 29.4 (L) 39.0 - 52.0 %   MCV 92.5 78.0 - 100.0 fL   MCH 29.9 26.0 - 34.0 pg   MCHC 32.3 30.0 - 36.0 g/dL   RDW 14.7 11.5 - 15.5 %   Platelets 219 150 - 400 K/uL    Comment: Performed at [facility], [place] [address], [place], [place] [address]  Creatinine, serum     Status: Abnormal   Collection Time: 07/27/18  2:06 AM  Result Value Ref Range   Creatinine, Ser 5.18 (H) 0.61 - 1.24 mg/dL   GFR calc non Af Amer 10 (L) >60 mL/min   GFR calc Af Amer 11 (L) >60 mL/min    Comment: (NOTE) The eGFR has been calculated using the CKD EPI equation. This calculation has not been validated in all clinical situations. eGFR's persistently <60 mL/min signify possible Chronic Kidney Disease. Performed at [facility], [place] [address], [place], [place] [address]   Troponin I (q 6hr x 3)     Status: Abnormal   Collection Time: 07/27/18  2:06 AM  Result Value Ref Range   Troponin I 0.32 (HH) <0.03 ng/mL    Comment: CRITICAL RESULT CALLED TO, READ BACK BY AND VERIFIED WITH: J.THOMAS,RN 0310 07/27/18 M.CAMPBELL Performed at [facility], [place] [address] [place], [place] [address]   Glucose, capillary     Status: Abnormal   Collection Time: 07/27/18  2:51 AM  Result Value Ref Range   Glucose-Capillary 192 (H) 70 - 99 mg/dL  MRSA PCR Screening     Status: None   Collection Time: 07/27/18  3:27 AM  Result Value Ref Range   MRSA by PCR NEGATIVE NEGATIVE    Comment:        The GeneXpert MRSA Assay (FDA approved for NASAL specimens only), is one component of a comprehensive MRSA colonization surveillance program. It is not intended to diagnose MRSA infection nor to  guide or monitor treatment for MRSA infections. Performed at [facility], [place] [address], [place], [place] [address]   Troponin I (q 6hr x 3)     Status: Abnormal   Collection Time: 07/27/18  7:08 AM  Result Value Ref Range   Troponin I 0.23 (HH) <0.03 ng/mL    Comment: CRITICAL VALUE NOTED.  VALUE IS CONSISTENT WITH PREVIOUSLY REPORTED AND CALLED VALUE. Performed at [facility], [place] [address] [place], [place] [address]   CBC with Differential/Platelet     Status: Abnormal   Collection Time: 07/27/18  7:08 AM  Result Value Ref Range   WBC 7.2 4.0 - 10.5 K/uL   RBC 3.18 (L) 4.22 - 5.81 MIL/uL   Hemoglobin 9.5 (L) 13.0 - 17.0 g/dL   HCT 29.7 (L) 39.0 - 52.0 %   MCV 93.4 78.0 - 100.0 fL   MCH 29.9 26.0 - 34.0 pg   MCHC 32.0 30.0 - 36.0 g/dL   RDW 14.6 11.5 - 15.5 %   Platelets 187 150 - 400 K/uL   Neutrophils Relative % 92 %   Neutro Abs 6.5 1.7 - 7.7 K/uL   Lymphocytes Relative 5 %   Lymphs Abs 0.4 (L) 0.7 - 4.0 K/uL   Monocytes Relative 3 %   Monocytes Absolute 0.2 0.1 - 1.0 K/uL   Eosinophils Relative 0  %   Eosinophils Absolute 0.0 0.0 - 0.7 K/uL   Basophils Relative 0 %   Basophils Absolute 0.0 0.0 - 0.1 K/uL   Immature Granulocytes 0 %   Abs Immature Granulocytes 0.0 0.0 - 0.1 K/uL    Comment: Performed at [facility], [address], [place], [place] [address]  Basic metabolic panel     Status: Abnormal   Collection Time: 07/27/18  7:08 AM  Result Value Ref Range   Sodium 132 (L) 135 - 145 mmol/L   Potassium 3.8 3.5 - 5.1 mmol/L   Chloride 97 (L) 98 - 111 mmol/L   CO2 21 (L) 22 - 32 mmol/L   Glucose, Bld 321 (H) 70 - 99 mg/dL   BUN 49 (H) 8 - 23 mg/dL   Creatinine, Ser 5.40 (H) 0.61 - 1.24 mg/dL   Calcium 8.4 (L) 8.9 - 10.3 mg/dL   GFR calc non Af Amer 9 (L) >60 mL/min   GFR calc Af Amer 10 (L) >60 mL/min    Comment: (NOTE) The eGFR has been calculated using the CKD EPI equation. This calculation has not been validated in all clinical situations. eGFR's persistently <60 mL/min signify possible Chronic Kidney Disease.    Anion gap 14 5 - 15    Comment: Performed at [facility] [address] [place], [place] [address]  Glucose, capillary     Status: Abnormal   Collection Time: 07/27/18  7:39 AM  Result Value Ref Range   Glucose-Capillary 302 (H) 70 - 99 mg/dL  Glucose, capillary     Status: Abnormal   Collection Time: 07/27/18 11:31 AM  Result Value Ref Range   Glucose-Capillary 490 (H) 70 - 99 mg/dL    ROS: Positives  as in hpi   Physical Exam: Vitals:   07/27/18 1006 07/27/18 1134  BP: (!) 154/78 (!) 145/80  Pulse:  87  Resp:  18  Temp:  (!) 97.5 F (36.4 C)  SpO2: 99% 100%     General: Alert WD, WN  Male NAD , OX3 HEENT: [place] , mmm, non icteric  Neck:  supple , no jvd  Heart: RRR, no m, r,g Lungs:  Some scattered Exp Wheezes  with decreased BS throghout ,  Currently non labored breathing  100% O2 Sat  [place] O2  Abdomen: bs pos , soft , NT, ND Extremities: no pedal edema  Skin: No acute rash , dry  Warm  Neuro: alert OX3 , moves all extrem   Dialysis Access: Pos bruit LUA AVF   Dialysis Orders: [facility]: [facility]   on MWF . EDW 105 kg HD Bath 2k, 3.5 Ca  Time 4hr  Heparin NONE  Access  LUA AVF    Hec 1 mcg IV/HD,   Mircera 60 mcg q 2 wks, last given 07/21/18 (last op hgb 10.5 (8/28)  Venofer  71m weely    Assessment/Plan 1. ESRD -  Hd on MWF, HD in am ,fu labs,   Needs  lower edw 2. Dyspnea - with  COPD exacerbation / volume overload  Combination  With CXR  Showing some vol overload  -  AM  HD attempt 3-3.5 liter uf  Use uf profile 4 ,  IV ABX , Solu med .nebs per admit  3. Hypertension/volume  - stable bp/ OP med Metoprol 25 mg bid, uf  On hd as above   4. Anemia  -hgb 9.5 ,Mircera  Given 07/21/18  Fu hgb trend , weekly fe on hd  5. Metabolic bone disease -  hec on hd ,Renvela as phos binder, Sensipar 30 mg on MWF, 6. HO CAD (multi vess. NSTEMI  03/2018, not CABG cand. )/ ICM ( EF 45-50%)/ ho  PPM, TAVR- CE neg , meds   7. HO DM type 2 -per admit 8. Nutrition - renal carb mod , renal vit   DErnest Haber PA-C [phone]/11/2017, 12:25 PM

## 2018-07-27 NOTE — ED Provider Notes (Signed)
[facility] EMERGENCY DEPARTMENT Provider Note   CSN: [id number] Arrival date & time: 07/26/18  2333     History   Chief Complaint Chief Complaint  Patient presents with  . Chest Pain  . Shortness of Breath    HPI Jeffrey [name] is a 77 y.o. male.  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  He presents today for evaluation of shortness of breath.  This is worsened over the past 2 weeks and has been using his home oxygen more frequently.  He denies any fevers, chills, or productive cough.  He did experience some discomfort in his chest that approximately 6 this evening.  The history is provided by the patient.  Chest Pain   This is a new problem. Episode onset: 6 hours ago. The problem occurs constantly. The problem has been gradually improving. The pain is present in the substernal region. The pain is moderate. The pain does not radiate. Associated symptoms include shortness of breath.  Shortness of Breath  Associated symptoms include chest pain.    Past Medical History:  Diagnosis Date  . Anginal pain ([place])   . Aortic stenosis    a. severe by echo 09/2014  . Atrial fibrillation ([place])    a. not well documented, not on anticoagulation  . CHF (congestive heart failure) ([place])    04/28/17 echo-EF 40%, mod LVH, diastolic dysfunction  . Claustrophobia   . Complete heart block ([place])   . COPD (chronic obstructive pulmonary disease) ([place])   . Coronary artery disease    a. chronically occluded RCA per cath 09/2014 with collaterals B. cath 05/01/17 chr occ RCA w/collaterals, 60-70% mid LAD,   . CVA (cerebral vascular accident) ([place]) 10/2014   denies residual on 07/11/2015  . ESRD (end stage renal disease) on dialysis [facility])    a. on dialysis; [facility]; MWF, LUE fistula (07/11/2015)  . History of blood transfusion    "related to gallbladder OR"  . History of stomach ulcers   . Hyperlipidemia   . Hypertension   . Iron deficiency anemia   . Myocardial infarction ([place]) 10/2014  . Peripheral vascular disease ([place])   . Pneumonia   . Presence of permanent cardiac pacemaker   . 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 Type II diabetes mellitus [facility])     Patient Active Problem List   Diagnosis Date Noted  . GERD (gastroesophageal reflux disease) 06/23/2018  . Cough 06/23/2018  . Chest pain 04/15/2018  . NSTEMI (non-ST elevated myocardial infarction) ([place]) 04/14/2018  . Rash 04/14/2018  . COPD GOLD 0 09/26/2017  . Atrial fibrillation ([place]) [I48.91] 05/21/2017  . Encounter for therapeutic drug monitoring 05/21/2017  . Obesity (BMI 30-39.9) 04/17/2017  . Hyponatremia 03/10/2017  . COPD with acute exacerbation ([place]) 03/10/2017  . Non-healing open wound of left groin 09/05/2015  . Automatic implantable cardioverter-defibrillator in situ 08/08/2015  . 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 Hypervolemia   . Chronic combined systolic and diastolic CHF (congestive heart failure) ([place]) 07/19/2015  . Fluid overload 07/18/2015  . Acute on chronic respiratory failure with hypoxia ([place]) 07/18/2015  . 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/2015  . Atherosclerosis of native coronary artery of native heart with angina pectoris ([place])   .  Protein-calorie malnutrition, severe ([place]) 01/14/2015  . ESRD (end stage renal disease) on dialysis ([place]) 01/12/2015  . Mobitz type 1 second degree AV block   . Elevated troponin 09/30/2014  . History of stroke 09/29/2014  . Type 2 diabetes mellitus with chronic kidney disease on chronic dialysis, with long-term current use of insulin ([place]) 09/29/2014  . CAD (coronary artery disease) 09/29/2014  . Essential hypertension    . History of tobacco use 11/21/2012  . Hyperlipidemia 01/19/2010  . Arthritis, degenerative 01/19/2010  . Allergic rhinitis 12/05/2009  . Idiopathic peripheral neuropathy 01/05/2009  . 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  . Absolute anemia 11/13/2007    Past Surgical History:  Procedure Laterality Date  . AV FISTULA PLACEMENT Left 10/19/2014   Procedure: BRACHIOCEPHALIC ARTERIOVENOUS (AV) FISTULA CREATION ;  Surgeon: Conrad Resaca, MD;  Location: [facility];  Service: Vascular;  Laterality: Left;  . CARDIAC CATHETERIZATION    . CARDIAC CATHETERIZATION N/A 07/22/2015   Procedure: Right/Left Heart Cath and Coronary Angiography;  Surgeon: [name], MD;  Location: [facility] CV LAB;  Service: Cardiovascular;  Laterality: N/A;  . CATARACT EXTRACTION W/ INTRAOCULAR LENS  IMPLANT, BILATERAL Bilateral 1990's  . CHOLECYSTECTOMY OPEN  1980's  . COLONOSCOPY W/ BIOPSIES AND POLYPECTOMY    . CORONARY ANGIOPLASTY    . CORONARY ATHERECTOMY N/A 04/18/2018   Procedure: CORONARY ATHERECTOMY;  Surgeon: [name], MD;  Location: [place] CV LAB;  Service: Cardiovascular;  Laterality: N/A;  . CORONARY STENT INTERVENTION N/A 04/18/2018   Procedure: CORONARY STENT INTERVENTION;  Surgeon: [name], MD;  Location: [facility] CV LAB;  Service: Cardiovascular;  Laterality: N/A;  . EP IMPLANTABLE DEVICE N/A 07/11/2015   Procedure: Pacemaker Implant;  Surgeon: [name], MD;  Location: [place] CV LAB;  Service: Cardiovascular;  Laterality: N/A;  . ESOPHAGOGASTRODUODENOSCOPY  08/01/2012   Procedure: ESOPHAGOGASTRODUODENOSCOPY (EGD);  Surgeon: [name], MD;  Location: [facility];  Service: Endoscopy;  Laterality: N/A;  . INSERT / REPLACE / REMOVE PACEMAKER  07/11/2015  . INSERTION OF DIALYSIS CATHETER Right 02/02/2015   Procedure: INSERTION OF DIALYSIS CATHETER  RIGHT INTERNAL JUGULAR;  Surgeon: Mal [name], MD;  Location: [facility];  Service:  Vascular;  Laterality: Right;  . LEFT AND RIGHT HEART CATHETERIZATION WITH CORONARY ANGIOGRAM N/A 09/30/2014   Procedure: LEFT AND RIGHT HEART CATHETERIZATION WITH CORONARY ANGIOGRAM;  Surgeon: [name], MD;  [facility] C[facility];  Service: Cardiovascular;  Laterality: N/A;  . LEFT HEART CATH AND CORONARY ANGIOGRAPHY N/A 04/14/2018   Procedure: LEFT HEART CATH AND CORONARY ANGIOGRAPHY;  Surgeon: [name], MD;  Location: [place] CV LAB;  Service: Cardiovascular;  Laterality: N/A;  . TEE WITHOUT CARDIOVERSION N/A 08/02/2015   Procedure: TRANSESOPHAGEAL ECHOCARDIOGRAM (TEE);  Surgeon: [name], MD;  Location: [facility];  Service: Open Heart Surgery;  Laterality: N/A;  . TONSILLECTOMY    . TRANSCATHETER AORTIC VALVE REPLACEMENT, TRANSFEMORAL Left 08/02/2015   Procedure: TRANSCATHETER AORTIC VALVE REPLACEMENT, TRANSFEMORAL;  Surgeon: [name], MD;  Location: [facility];  Service: Open Heart Surgery;  Laterality: Left;        Home Medications    Prior to Admission medications   Medication Sig Start Date End Date Taking? Authorizing Provider  albuterol (PROVENTIL HFA;VENTOLIN HFA) 108 (90 Base) MCG/ACT inhaler Inhale 2 puffs into the lungs every 6 (six) hours as needed for wheezing or shortness of breath. 04/19/18   Rai, Ripudeep K, MD  amoxicillin-clavulanate (AUGMENTIN) 536-144  MG tablet Take 1 tablet by mouth 2 (two) times daily. 06/09/18   Tanda Rockers, MD  aspirin EC 81 MG tablet Take 1 tablet (81 mg total) by mouth daily. 05/15/17   Eileen Stanford, PA-C  atorvastatin (LIPITOR) 40 MG tablet Take 2 tablets (80 mg total) by mouth daily. 05/03/17   Allie Bossier, MD  cinacalcet (SENSIPAR) 30 MG tablet Take 30 mg by mouth daily with supper.    [provider]  clopidogrel (PLAVIX) 75 MG tablet Take 1 tablet (75 mg total) by mouth daily. 05/07/18   Deboraha Sprang, MD  doxycycline (VIBRA-TABS) 100 MG tablet Take 1 tablet (100 mg total) by mouth 2 (two) times daily.  05/27/18   Lauraine Rinne, NP  insulin detemir (LEVEMIR) 100 UNIT/ML injection Inject 0.5 mLs (50 Units total) into the skin at bedtime. 04/19/18   Rai, Vernelle Emerald, MD  lidocaine-prilocaine (EMLA) cream Apply 1 application topically once as needed (prior to accessing port).     [provider]  metoprolol tartrate (LOPRESSOR) 25 MG tablet Take 1 tablet (25 mg total) by mouth 2 (two) times daily. 05/07/18   Deboraha Sprang, MD  nitroGLYCERIN (NITROSTAT) 0.4 MG SL tablet Place 1 tablet (0.4 mg total) under the tongue every 5 (five) minutes as needed for chest pain. 04/19/18   Rai, Vernelle Emerald, MD  omeprazole (PRILOSEC) 20 MG capsule Take 1 capsule (20 mg total) by mouth daily. 06/23/18   Lauraine Rinne, NP  predniSONE (DELTASONE) 10 MG tablet 4 tabs for 2 days, then 3 tabs for 2 days, 2 tabs for 2 days, then 1 tab for 2 days, then stop 05/27/18   Lauraine Rinne, NP  predniSONE (DELTASONE) 10 MG tablet Take 10 mg 4 tabs am x 2 days,   2 tabs am x 2 days,  1 tab am x 2 days and stop 06/11/18   Tanda Rockers, MD  ranitidine (ZANTAC) 150 MG tablet Take 150 mg by mouth 2 (two) times daily as needed for heartburn.    [provider]  Triamcinolone Acetonide (TRIAMCINOLONE 0.1 % CREAM : EUCERIN) CREA Apply 1 application topically 3 (three) times daily as needed. Apply to arms, back, and other areas with active rash/itching 04/19/18   Rai, Vernelle Emerald, MD    Family History Family History  Problem Relation Age of Onset  . Diabetes Father   . Heart disease Father   . Diabetes Sister   . Alzheimer's disease Mother     Social History Social History   Tobacco Use  . Smoking status: Former Smoker    Packs/day: 2.00    Years: 32.00    Pack years: 64.00    Last attempt to quit: 11/27/1983    Years since quitting: 34.6  . Smokeless tobacco: Never Used  Substance Use Topics  . Alcohol use: Yes    Comment: rare  . Drug use: No     Allergies   Byetta 10 mcg pen [exenatide]; Codeine; and Coumadin  [warfarin sodium]   Review of Systems Review of Systems  Respiratory: Positive for shortness of breath.   Cardiovascular: Positive for chest pain.  All other systems reviewed and are negative.    Physical Exam Updated Vital Signs BP (!) 120/53   Pulse 79   Temp 99 F (37.2 C) (Oral)   Resp (!) 21   SpO2 95%   Physical Exam  Constitutional: He is oriented to person, place, and time. He appears well-developed  and well-nourished. No distress.  HENT:  Head: Normocephalic and atraumatic.  Mouth/Throat: Oropharynx is clear and moist.  Neck: Normal range of motion. Neck supple.  Cardiovascular: Normal rate and regular rhythm. Exam reveals no friction rub.  No murmur heard. Pulmonary/Chest: Effort normal and breath sounds normal. No respiratory distress. He has no wheezes. He has no rales.  There are expiratory wheezes bilaterally.  Patient is in no respiratory distress, however does have difficulty finishing sentences.  Abdominal: Soft. Bowel sounds are normal. He exhibits no distension. There is no tenderness.  Musculoskeletal: Normal range of motion. He exhibits no edema.  Neurological: He is alert and oriented to person, place, and time. Coordination normal.  Skin: Skin is warm and dry. He is not diaphoretic.  Nursing note and vitals reviewed.    ED Treatments / Results  Labs (all labs ordered are listed, but only abnormal results are displayed) Labs Reviewed  BASIC METABOLIC PANEL - Abnormal; Notable for the following components:      Result Value   Sodium 132 (*)    Chloride 93 (*)    Glucose, Bld 237 (*)    BUN 43 (*)    Creatinine, Ser 4.89 (*)    GFR calc non Af Amer 10 (*)    GFR calc Af Amer 12 (*)    All other components within normal limits  CBC - Abnormal; Notable for the following components:   RBC 3.48 (*)    Hemoglobin 10.2 (*)    HCT 32.4 (*)    All other components within normal limits  I-STAT TROPONIN, ED - Abnormal; Notable for the following  components:   Troponin i, poc 0.21 (*)    All other components within normal limits    EKG EKG Interpretation  Date/Time:  Saturday July 26 2018 23:40:42 EDT Ventricular Rate:  89 PR Interval:  164 QRS Duration: 212 QT Interval:  450 QTC Calculation: 547 R Axis:   -74 Text Interpretation:  VENTRICULAR PACED RHYTHM Confirmed by Veryl Speak [phone]) on 07/26/2018 11:50:52 PM   Radiology Dg Chest 2 View  Result Date: 07/27/2018 CLINICAL DATA:  Shortness of breath and LEFT chest pain beginning at 6 p.m. History of CHF, COPD, TAVR. EXAM: CHEST - 2 VIEW COMPARISON:  Chest radiograph June 23, 2018 FINDINGS: Moderate cardiomegaly similar to prior examination. Status post aortic valve replacement. Calcified aortic arch. Mild pulmonary vascular congestion and increasing interstitial prominence. Patchy retrocardiac consolidation. Small pleural effusions. Flattened hemidiaphragms. RIGHT cardiac pacemaker in situ. Calcifications in LEFT neck are likely vascular. Surgical clips in the included right abdomen compatible with cholecystectomy. IMPRESSION: 1. Stable cardiomegaly. Increasing interstitial prominence most compatible with pulmonary edema. Retrocardiac consolidation. Small pleural effusions. 2. Underlying suspected COPD. 3.  Aortic Atherosclerosis (ICD10-I70.0). Electronically Signed   By: Elon Alas M.D.   On: 07/27/2018 00:42    Procedures Procedures (including critical care time)  Medications Ordered in ED Medications  ipratropium-albuterol (DUONEB) 0.5-2.5 (3) MG/3ML nebulizer solution 3 mL (has no administration in time range)     Initial Impression / Assessment and Plan / ED Course  I have reviewed the triage vital signs and the nursing notes.  Pertinent labs & imaging results that were available during my care of the patient were reviewed by me and considered in my medical decision making (see chart for details).  Patient with extensive past medical history presenting  with shortness of breath and chest discomfort.  He has a mild elevation of his troponin, however is a dialysis  patient so I am uncertain the significance of this.  I do feel as though the patient should be admitted for further observation and serial enzymes.  I have spoken with Dr. Hampton Abbot from the hospitalist service who agrees to admit.  Final Clinical Impressions(s) / ED Diagnoses   Final diagnoses:  None    ED Discharge Orders    None       Veryl Speak, MD 07/27/18 [phone]

## 2018-07-27 NOTE — Progress Notes (Signed)
PT Cancellation Note  Patient Details Name: Jeffrey [name] MRN: [id number] DOB: Oct 20, 1941   Cancelled Treatment:    Reason Eval/Treat Not Completed: Other (comment)   Noted critical troponin value; Would like for Dr. Cathlean Sauer to weigh in re: physical activity before proceeding with PT eval;   Will follow,   Roney Marion, [place] Pager [phone] Office [phone]    [place] 07/27/2018, 7:50 AM

## 2018-07-27 NOTE — Progress Notes (Signed)
Patient arrived to unit, 2 assist to get to bed, patient does not endorse pain. Has scabs all over upper body, he says they itch. Patient has pacemaker, cardiac monitoring initiated and verified. Patient has rocephin infusing through right hand IV, Left upper arm has AVF present. Patient updated on plan of care.

## 2018-07-27 NOTE — Progress Notes (Signed)
Pt complained chest pain, 1 dose SL nitro given, EKG done, MD paged  Pt felt better in 1 dose of nitro SL, vitals stable, will continue to monitor  Palma Holter, RN

## 2018-07-27 NOTE — Progress Notes (Signed)
PROGRESS NOTE    Jeffrey [name]  XIH:[id number] DOB: 1941/08/15 DOA: 07/26/2018 PCP: Thurman Coyer, MD    Brief Narrative:  77 year old male who presented with progressive dyspnea and worsening cough.  He does have significant past medical history for COPD with chronic hypoxic respiratory failure, end-stage renal disease on hemodialysis, (MWF), coronary artery disease, aortic stenosis status post TAVR, complete heart block status post pacemaker, and diastolic heart failure.  Reported 2 to 3 weeks of worsening cough and dyspnea, symptoms were refractive to outpatient therapy with antibiotics and steroids.  Her initial physical examination blood pressure 122/53, heart rate 80, respiratory rate 21, temperature 99, oxygen saturation 95%.  Moist mucous membranes, positive JVD, heart S1-S2 present, rhythmic, no gallops, rubs or murmurs, lungs with positive expiratory wheezing, poor air movement, labored breathing, abdomen soft nontender, no significant lower extremity edema.  Left upper extremity AV fistula with thrill.  132, potassium 3.9, chloride 93, bicarb 25, glucose 237, BUN 43, creatinine 4.8, white count 10.5, 10.2, hematocrit 32.4, platelets 261.  Chest x-ray with cardiomegaly, bibasilar atelectasis.  EKG with atrial sensing, V paced.   Patient was admitted to the hospital with working diagnosis of COPD exacerbation.  Assessment & Plan:   Active Problems:   Respiratory distress   1. COPD exacerbation. Patient not back to his baseline, will continue aggressive bronchodilator therapy, systemic steroids, oxymetry monitoring and supplemental 02 per [place]. No infiltrates on chest film, will discontinue ceftriaxone and will continue azithromycin. Continue dulera.   2. ESRD on HD. Patient euvolemic, HD on MWF. Last HD with no major complications, on 34/91. Nephrology contacted for HD in am. Continue cinacalcet and sevelamer.   3. T2DM. Glucose monitoring with insulin sliding scale, patient is  tolerating po well. Capillary glucose has been high 490 and 430, fasting glucose 321. Will increase insulin sliding scale to moderate resistance and will continue basal with levimir 40 units daily.   4. HTN. Continue blood pressure control with metoprolol.   5. Coronary artery disease/ ischemic heart disease. Continue as needed nitroglycerin for chest pain. Dual antiplatelet therapy with asa and clopidogrel, statin therapy with 80 mg of atorvastatin. Mild troponin elevation, not consistent with acute coronary syndrome.   DVT prophylaxis: heparin   Code Status:  full Family Communication: I spoke with patient's family at the bedside and all questions were addressed.  Disposition Plan/ discharge barriers: pending clinical improvement    Consultants:     Procedures:     Antimicrobials:   Azithromycin     Subjective: Patient is feeling better but not back to baseline, continue to have significant dyspnea with exertion, this am had chest pain that improved with nitroglycerin.   Objective: Vitals:   07/27/18 0611 07/27/18 0822 07/27/18 0838 07/27/18 1006  BP:   110/80 (!) 154/78  Pulse:   80   Resp:      Temp: (!) 97.4 F (36.3 C)     TempSrc: Oral     SpO2:  94%  99%  Weight:      Height:        Intake/Output Summary (Last 24 hours) at 07/27/2018 1017 Last data filed at 07/27/2018 0925 Gross per 24 hour  Intake 840 ml  Output -  Net 840 ml   Filed Weights   07/27/18 0246  Weight: 105.6 kg    Examination:   General: deconditioned  Neurology: Awake and alert, non focal  E ENT: mild pallor, no icterus, oral mucosa moist Cardiovascular: No JVD. S1-S2 present,  rhythmic, no gallops, rubs, or murmurs. No lower extremity edema. Pulmonary: decreased breath sounds bilaterally, poor air movement,  expiratory wheezing, with scattered rhonchi and rales. Gastrointestinal. Abdomen protuberant with no organomegaly, non tender, no rebound or guarding Skin. No  rashes Musculoskeletal: no joint deformities     Data Reviewed: I have personally reviewed following labs and imaging studies  CBC: Recent Labs  Lab 07/26/18 2347 07/27/18 0206 07/27/18 0708  WBC 10.5 9.2 7.2  NEUTROABS  --   --  6.5  HGB 10.2* 9.5* 9.5*  HCT 32.4* 29.4* 29.7*  MCV 93.1 92.5 93.4  PLT 261 219 732   Basic Metabolic Panel: Recent Labs  Lab 07/26/18 2347 07/27/18 0206 07/27/18 0708  NA 132*  --  132*  K 3.9  --  3.8  CL 93*  --  97*  CO2 25  --  21*  GLUCOSE 237*  --  321*  BUN 43*  --  49*  CREATININE 4.89* 5.18* 5.40*  CALCIUM 8.9  --  8.4*   GFR: Estimated Creatinine Clearance: 13.7 mL/min (A) (by C-G formula based on SCr of 5.4 mg/dL (H)). Liver Function Tests: No results for input(s): AST, ALT, ALKPHOS, BILITOT, PROT, ALBUMIN in the last 168 hours. No results for input(s): LIPASE, AMYLASE in the last 168 hours. No results for input(s): AMMONIA in the last 168 hours. Coagulation Profile: No results for input(s): INR, PROTIME in the last 168 hours. Cardiac Enzymes: Recent Labs  Lab 07/27/18 0206 07/27/18 0708  TROPONINI 0.32* 0.23*   BNP (last 3 results) No results for input(s): PROBNP in the last 8760 hours. HbA1C: No results for input(s): HGBA1C in the last 72 hours. CBG: Recent Labs  Lab 07/27/18 0251 07/27/18 0739  GLUCAP 192* 302*   Lipid Profile: No results for input(s): CHOL, HDL, LDLCALC, TRIG, CHOLHDL, LDLDIRECT in the last 72 hours. Thyroid Function Tests: No results for input(s): TSH, T4TOTAL, FREET4, T3FREE, THYROIDAB in the last 72 hours. Anemia Panel: No results for input(s): VITAMINB12, FOLATE, FERRITIN, TIBC, IRON, RETICCTPCT in the last 72 hours.    Radiology Studies: I have reviewed all of the imaging during this hospital visit personally     Scheduled Meds: . aspirin EC  81 mg Oral Daily  . atorvastatin  80 mg Oral Daily  . cinacalcet  30 mg Oral Q supper  . clopidogrel  75 mg Oral Daily  . heparin   5,000 Units Subcutaneous Q8H  . insulin aspart  0-9 Units Subcutaneous TID WC  . insulin detemir  40 Units Subcutaneous QHS  . ipratropium-albuterol  3 mL Nebulization BID  . metoprolol succinate  25 mg Oral Daily  . mometasone-formoterol  1 puff Inhalation BID  . pantoprazole  40 mg Oral Daily  . [START ON 07/28/2018] predniSONE  40 mg Oral Q breakfast   Continuous Infusions: . sodium chloride    . azithromycin 500 mg (07/27/18 0407)  . cefTRIAXone (ROCEPHIN)  IV Stopped (07/27/18 0323)     LOS: 0 days        Mauricio Gerome Apley, MD [facility] Pager [phone]

## 2018-07-27 NOTE — Progress Notes (Addendum)
Pt's CBG 463, 15 unit insulin coverage given, MD aware, changes the coverage pattern earlier, pt aware that he is in steroid.  Palma Holter, RN

## 2018-07-27 NOTE — H&P (Signed)
History and Physical  [name]:[id number] DOB: 1941-08-05 DOA: 07/26/2018 2335   PCP: Thurman Coyer, MD     HISTORY   Chief Complaint: progressive dyspnea and worsening cough  HPI: Jeffrey [name] is a 77 y.o. male with COPD on 2-3 L O2, ESRD on HD MWF hx of CAD, AS s/p TAVR, hx of complete heart block s/p PPM, EF 45-50%, who presented with progressive dyspnea after 2-3 weeks of productive cough -- described as dark yellowish in color. He reports having completed 2 course of PO abx (amoxicillin then doxycycline) and prednisone tapers without significant improvement in his symptoms. At baseline uses 2-3L O2 but has been requiring 3-4L O2 over the past week. Increased dyspnea on exertion from baseline. Reports compliance with HD sessions (last session on Fri) and that his weights have remained near 105 kg (dry weight). Denies fevers -- although wife reports that patient has been complaining of more chills over the past week. He has also noted episodes of chest tightness with radiation into his jaw and left shoulder -- this chest pain has been generally associated with coughing spells.   Review of Systems:  + cough, dyspnea, chills (per family) + chest pain + decreased appetitie + associated nausea with cough - no edema, PND, orthopnea - no tarry, melanotic or bloody stools - no dysuria, increased urinary frequency - no weight changes  Rest of systems reviewed are negative, except as per above history.   ED course:  Vitals Blood pressure (!) 122/53, pulse 80, temperature 99 F (37.2 C), temperature source Oral, resp. rate (!) 21, height 5\' 11"  (1.803 m), weight 105.6 kg, SpO2 95 %. Received duonebs x 1 in ED.   Past Medical History:  Diagnosis Date  . Anginal pain ([place])   . Aortic stenosis    a. severe by echo 09/2014  . Atrial fibrillation ([place])    a. not well documented, not on anticoagulation  . CHF (congestive heart failure) ([place])    04/28/17 echo-EF 40%, mod  LVH, diastolic dysfunction  . Claustrophobia   . Complete heart block ([place])   . COPD (chronic obstructive pulmonary disease) ([place])   . Coronary artery disease    a. chronically occluded RCA per cath 09/2014 with collaterals B. cath 05/01/17 chr occ RCA w/collaterals, 60-70% mid LAD,   . CVA (cerebral vascular accident) ([place]) 10/2014   denies residual on 07/11/2015  . ESRD (end stage renal disease) on dialysis [facility])    a. on dialysis; [facility]; MWF, LUE fistula (07/11/2015)  . History of blood transfusion    "related to gallbladder OR"  . History of stomach ulcers   . Hyperlipidemia   . Hypertension   . Iron deficiency anemia   . Myocardial infarction ([place]) 10/2014  . Peripheral vascular disease ([place])   . Pneumonia   . Presence of permanent cardiac pacemaker   . 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 Type II diabetes mellitus ([place])    Past Surgical History:  Procedure Laterality Date  . AV FISTULA PLACEMENT Left 10/19/2014   Procedure: BRACHIOCEPHALIC ARTERIOVENOUS (AV) FISTULA CREATION ;  Surgeon: Conrad Meadow, MD;  Location: [place];  Service: Vascular;  Laterality: Left;  . CARDIAC CATHETERIZATION    . CARDIAC CATHETERIZATION N/A 07/22/2015   Procedure: Right/Left Heart Cath and Coronary Angiography;  Surgeon: [name], MD;  Location:  [place] INVASIVE CV LAB;  Service: Cardiovascular;  Laterality: N/A;  . CATARACT EXTRACTION W/ INTRAOCULAR LENS  IMPLANT, BILATERAL Bilateral 1990's  . CHOLECYSTECTOMY OPEN  1980's  . COLONOSCOPY W/ BIOPSIES AND POLYPECTOMY    . CORONARY ANGIOPLASTY    . CORONARY ATHERECTOMY N/A 04/18/2018   Procedure: CORONARY ATHERECTOMY;  Surgeon: [name], MD;  Location: [place] CV LAB;  Service: Cardiovascular;  Laterality: N/A;  . CORONARY STENT INTERVENTION N/A 04/18/2018   Procedure: CORONARY STENT INTERVENTION;   Surgeon: [name], MD;  Location: [place] CV LAB;  Service: Cardiovascular;  Laterality: N/A;  . EP IMPLANTABLE DEVICE N/A 07/11/2015   Procedure: Pacemaker Implant;  Surgeon: [name], MD;  Location: [place] CV LAB;  Service: Cardiovascular;  Laterality: N/A;  . ESOPHAGOGASTRODUODENOSCOPY  08/01/2012   Procedure: ESOPHAGOGASTRODUODENOSCOPY (EGD);  Surgeon: [name], MD;  Location: [facility];  Service: Endoscopy;  Laterality: N/A;  . INSERT / REPLACE / REMOVE PACEMAKER  07/11/2015  . INSERTION OF DIALYSIS CATHETER Right 02/02/2015   Procedure: INSERTION OF DIALYSIS CATHETER  RIGHT INTERNAL JUGULAR;  Surgeon: Mal [name], MD;  Location: [place];  Service: Vascular;  Laterality: Right;  . LEFT AND RIGHT HEART CATHETERIZATION WITH CORONARY ANGIOGRAM N/A 09/30/2014   Procedure: LEFT AND RIGHT HEART CATHETERIZATION WITH CORONARY ANGIOGRAM;  Surgeon: [name], MD;  Location: [facility] CATH LAB;  Service: Cardiovascular;  Laterality: N/A;  . LEFT HEART CATH AND CORONARY ANGIOGRAPHY N/A 04/14/2018   Procedure: LEFT HEART CATH AND CORONARY ANGIOGRAPHY;  Surgeon: [name], MD;  Location: [place] CV LAB;  Service: Cardiovascular;  Laterality: N/A;  . TEE WITHOUT CARDIOVERSION N/A 08/02/2015   Procedure: TRANSESOPHAGEAL ECHOCARDIOGRAM (TEE);  Surgeon: [name], MD;  Location: [place];  Service: Open Heart Surgery;  Laterality: N/A;  . TONSILLECTOMY    . TRANSCATHETER AORTIC VALVE REPLACEMENT, TRANSFEMORAL Left 08/02/2015   Procedure: TRANSCATHETER AORTIC VALVE REPLACEMENT, TRANSFEMORAL;  Surgeon: [name], MD;  Location: [place];  Service: Open Heart Surgery;  Laterality: Left;    Social History:  reports that he quit smoking about 34 years ago. He has a 64.00 pack-year smoking history. He has never used smokeless tobacco. He reports that he drinks alcohol. He reports that he does not use drugs.  Allergies  Allergen Reactions  . Byetta 10 Mcg Pen [Exenatide]  Diarrhea and Nausea And Vomiting  . Codeine Itching  . Coumadin [Warfarin Sodium] Rash    Family History  Problem Relation Age of Onset  . Diabetes Father   . Heart disease Father   . Diabetes Sister   . Alzheimer's disease Mother       Prior to Admission medications   Medication Sig Start Date End Date Taking? Authorizing Provider  albuterol (PROVENTIL HFA;VENTOLIN HFA) 108 (90 Base) MCG/ACT inhaler Inhale 2 puffs into the lungs every 6 (six) hours as needed for wheezing or shortness of breath. 04/19/18  Yes Rai, Ripudeep K, MD  amoxicillin-clavulanate (AUGMENTIN) 875-125 MG tablet Take 1 tablet by mouth 2 (two) times daily. 06/09/18  Yes Tanda Rockers, MD  aspirin EC 81 MG tablet Take 1 tablet (81 mg total) by mouth daily. 05/15/17  Yes Eileen Stanford, PA-C  atorvastatin (LIPITOR) 40 MG tablet Take 2 tablets (80 mg total) by mouth daily. 05/03/17  Yes Allie Bossier, MD  cinacalcet (SENSIPAR) 30 MG tablet Take 30 mg by mouth daily with supper.   Yes [provider]  clopidogrel (PLAVIX) 75 MG  tablet Take 1 tablet (75 mg total) by mouth daily. 05/07/18  Yes Deboraha Sprang, MD  doxycycline (VIBRA-TABS) 100 MG tablet Take 1 tablet (100 mg total) by mouth 2 (two) times daily. 05/27/18  Yes Lauraine Rinne, NP  insulin detemir (LEVEMIR) 100 UNIT/ML injection Inject 0.5 mLs (50 Units total) into the skin at bedtime. 04/19/18  Yes Rai, Ripudeep K, MD  lidocaine-prilocaine (EMLA) cream Apply 1 application topically once as needed (prior to accessing port).    Yes [provider]  metoprolol tartrate (LOPRESSOR) 25 MG tablet Take 1 tablet (25 mg total) by mouth 2 (two) times daily. 05/07/18  Yes Deboraha Sprang, MD  nitroGLYCERIN (NITROSTAT) 0.4 MG SL tablet Place 1 tablet (0.4 mg total) under the tongue every 5 (five) minutes as needed for chest pain. 04/19/18  Yes Rai, Ripudeep K, MD  omeprazole (PRILOSEC) 20 MG capsule Take 1 capsule (20 mg total) by mouth daily. 06/23/18  Yes  Lauraine Rinne, NP  predniSONE (DELTASONE) 10 MG tablet 4 tabs for 2 days, then 3 tabs for 2 days, 2 tabs for 2 days, then 1 tab for 2 days, then stop 05/27/18  Yes Lauraine Rinne, NP  predniSONE (DELTASONE) 10 MG tablet Take 10 mg 4 tabs am x 2 days,   2 tabs am x 2 days,  1 tab am x 2 days and stop 06/11/18  Yes Tanda Rockers, MD  ranitidine (ZANTAC) 150 MG tablet Take 150 mg by mouth 2 (two) times daily as needed for heartburn.   Yes [provider]  Triamcinolone Acetonide (TRIAMCINOLONE 0.1 % CREAM : EUCERIN) CREA Apply 1 application topically 3 (three) times daily as needed. Apply to arms, back, and other areas with active rash/itching 04/19/18  Yes Rai, Ripudeep K, MD    Temp:  [99 F (37.2 C)] 99 F (37.2 C) (08/31 2340) Pulse Rate:  [78-88] 80 (09/01 0215) Cardiac Rhythm: Atrial paced (09/01 0056) Resp:  [21-28] 21 (09/01 0215) BP: (120-141)/(53-63) 122/53 (09/01 0215) SpO2:  [93 %-98 %] 95 % (09/01 0215) Weight:  [105.6 kg] 105.6 kg (09/01 0246)  PHYSICAL EXAM   BP (!) 122/53   Pulse 80   Temp 99 F (37.2 C) (Oral)   Resp (!) 21   Ht 5\' 11"  (1.803 m)   Wt 105.6 kg   SpO2 95%   BMI 32.46 kg/m    GEN obese elderly caucasian male; appears uncomfortable, labored breathing and audible wheezing  HEENT NCAT EOM PERRL; clear oropharynx, no cervical LAD; moist mucus membranes  JVP estimated 6 cm H2O above RA; no HJR ; no carotid bruits b/l ;  CV regular normal rate; normal S1 and S2; no m/r/g or S3/S4; PMI non displaced; no parasternal heave  RESP  Dense expiratory wheezing throughout; poor air movement throughout; breathing labored; on 4 L O2 [place] ABD soft NT ND +normoactive BS  EXT warm throughout b/l; no peripheral edema b/l; LUE fistula with intact thrill PULSES  DP and radials 2+ intact b/l  SKIN/MSK no rashes or lesions  NEURO/PSYCH AAOx4; no focal deficits   DATA   LABS ON ADMISSION:  Basic Metabolic Panel: Recent Labs  Lab 07/26/18 2347  NA 132*  K 3.9    CL 93*  CO2 25  GLUCOSE 237*  BUN 43*  CREATININE 4.89*  CALCIUM 8.9   CBC: Recent Labs  Lab 07/26/18 2347 07/27/18 0206  WBC 10.5 9.2  HGB 10.2* 9.5*  HCT 32.4* 29.4*  MCV  93.1 92.5  PLT 261 219   Liver Function Tests: No results for input(s): AST, ALT, ALKPHOS, BILITOT, PROT, ALBUMIN in the last 168 hours. No results for input(s): LIPASE, AMYLASE in the last 168 hours. No results for input(s): AMMONIA in the last 168 hours. Coagulation:  Lab Results  Component Value Date   INR 2.5 12/19/2017   INR 2.2 11/21/2017   INR 2.3 10/22/2017   No results found for: PTT Lactic Acid, Venous:     Component Value Date/Time   LATICACIDVEN 0.85 03/09/2017 2347   Cardiac Enzymes: No results for input(s): CKTOTAL, CKMB, CKMBINDEX, TROPONINI in the last 168 hours. Urinalysis:    Component Value Date/Time   COLORURINE YELLOW 06/17/2017 [place] 06/17/2017 0547   LABSPEC 1.010 06/17/2017 0547   PHURINE 6.0 06/17/2017 0547   GLUCOSEU >=500 (A) 06/17/2017 0547   HGBUR SMALL (A) 06/17/2017 0547   BILIRUBINUR NEGATIVE 06/17/2017 0547   KETONESUR NEGATIVE 06/17/2017 0547   PROTEINUR 100 (A) 06/17/2017 0547   UROBILINOGEN 0.2 09/28/2014 2257   NITRITE NEGATIVE 06/17/2017 0547   LEUKOCYTESUR TRACE (A) 06/17/2017 0547    BNP (last 3 results) No results for input(s): PROBNP in the last 8760 hours. CBG: No results for input(s): GLUCAP in the last 168 hours.  Radiological Exams on Admission: Dg Chest 2 View  Result Date: 07/27/2018 CLINICAL DATA:  Shortness of breath and LEFT chest pain beginning at 6 p.m. History of CHF, COPD, TAVR. EXAM: CHEST - 2 VIEW COMPARISON:  Chest radiograph June 23, 2018 FINDINGS: Moderate cardiomegaly similar to prior examination. Status post aortic valve replacement. Calcified aortic arch. Mild pulmonary vascular congestion and increasing interstitial prominence. Patchy retrocardiac consolidation. Small pleural effusions. Flattened  hemidiaphragms. RIGHT cardiac pacemaker in situ. Calcifications in LEFT neck are likely vascular. Surgical clips in the included right abdomen compatible with cholecystectomy. IMPRESSION: 1. Stable cardiomegaly. Increasing interstitial prominence most compatible with pulmonary edema. Retrocardiac consolidation. Small pleural effusions. 2. Underlying suspected COPD. 3.  Aortic Atherosclerosis (ICD10-I70.0). Electronically Signed   By: Elon Alas M.D.   On: 07/27/2018 00:42    EKG: Independently reviewed. V-paced rhythm   ASSESSMENT AND PLAN   Assessment: Jeffrey [name] is a 77 y.o. male presents with increasing O2 requirement and wheezing with productive cough. He notably  has a past medical history of Anginal pain ([place]), Aortic stenosis, Atrial fibrillation ([place]), CHF (congestive heart failure) ([place]), Claustrophobia, Complete heart block ([place]), COPD (chronic obstructive pulmonary disease) ([place]), Coronary artery disease, CVA (cerebral vascular accident) ([place]) (10/2014), ESRD (end stage renal disease) on dialysis [facility]), History of blood transfusion, History of stomach ulcers, Hyperlipidemia, Hypertension, Iron deficiency anemia, Myocardial infarction ([place]) (10/2014), Peripheral vascular disease ([place]), Pneumonia, Presence of permanent cardiac pacemaker, S/P TAVR (transcatheter aortic valve replacement) (08/02/2015), and Type II diabetes mellitus ([place]).   Suspect COPD exacerbation but cannot rule out superimposed PNA at this time. Fluid overload also likely component although on exam appears to be relatively euvolemic and current weight is actually at baseline 105.6 kg -- patient does report good compliance with HD and his dry weights. Of course, there is the possibility that his true dry weight is actually lower than previously thought. Will treat with IV abx for CAP and also treat for COPD flare. Plan for HD resumption on Monday per nephrology.   Active Problems:   Respiratory distress   Plan:    # Respiratory distress with increasing O2 requirement and wheezing: likely COPD exacerbation but cannot rule out superimposed  PNA at this time. Fluid overload also may be contributing although weight is actually at baseline 105.6 kg and patient reports good compliance with HD and his dry weights.  > CXR shows pulm edema and slight increase in consolidation in RLL and R medial lobe - IV ceftriaxone and azithromycin for CAP coverage - duonebs q4h - urine strep and legionella ag pending - solumedrol 80mg  x 1 then prednisone 40mg  x 4 days - BNP pending - plan to resume dialysis while inpatient - sputum culture ordered - low threshold to order non contrast CT chest - starting dulera inhaler while inpatient (at home on ventolin)  # ESRD on HD MWF. Patient at dry weight 105 kg > last HD on Friday - consult nephrology in AM to resume HD - resume home cinacalcet  # Chronic acid reflux   - resume PPI and H2 blocker   # IDDM - on levemir 50 units at home - resuming levemir at 40 units while inpatient and can uptitrate as needed - AC SSI and ACHS fingersticks  # Ischemic cardiomyopathy (EF 45-50%) and CAD s/p stents and hx of complete heart block s/p PPM > mild trop elevation and no discernible ST changes baseline v-paced rhythm. Does not appear significantly volume overloaded on exam. - changed metop tart to succinate  - BNP pending - resume home aspirin and plavix - resume home statin - continue telemetry monitor for 12 hours - trend troponin x 2 - no need for repeat TTE at this time (last echo in 03/2018 in [facility] system)   DVT Prophylaxis: heparin subq Code Status:  Full Code Family Communication: wife and son at bedside  Disposition Plan: inpatient to telemetry   Extended Emergency Contact Information Primary Emergency Contact: Zelek,Rebecca A Address: [address]          [place], [place] [address] [place] Phone: [phone] Relation:  Spouse Secondary Emergency Contact: [place] Phone: [phone] Relation: Son  Time spent: > 35 minutes  Colbert Ewing, MD [facility] Pager [phone]  If 7PM-7AM, please contact night-coverage [email or web address] Password [email or web address] 07/27/2018, 2:47 AM

## 2018-07-27 NOTE — Evaluation (Addendum)
Physical Therapy Evaluation Patient Details Name: Jeffrey [name] MRN: [id number] DOB: [birth year] Today's Date: 07/27/2018   History of Present Illness  Jeffrey [name] is a 77 y.o. male with COPD on 2-3 L O2, ESRD on HD MWF hx of CAD, AS s/p TAVR, hx of complete heart block s/p PPM, EF 45-50%, who presented with progressive dyspnea after 2-3 weeks of productive cough  Clinical Impression   Pt admitted with above diagnosis. Pt currently with functional limitations due to the deficits listed below (see PT Problem List). Working diagnosis of COPD with possible pneumonia; uses RW at home; Presents with decr functional mobility, decr balance, with heavy dependence on UE support in standing; He tells me on eval that he does not want to participate in progressive ambulation, which is unfortunate, as it will be important to assess his activity tolerance post HD;  Pt will benefit from skilled PT to increase their independence and safety with mobility to allow discharge to the venue listed below.       Follow Up Recommendations Home health PT;Supervision/Assistance - 24 hour    Equipment Recommendations  Will pt's insurance cover a shower seat?   Recommendations for Other Services       Precautions / Restrictions Precautions Precautions: Fall      Mobility  Bed Mobility                  Transfers Overall transfer level: Needs assistance   Transfers: Sit to/from Stand Sit to Stand: Mod assist         General transfer comment: Light mod assist to power up; heavy dependence on UE support  Ambulation/Gait Ambulation/Gait assistance: Mod assist Gait Distance (Feet): (March in place at [facility]) Assistive device: 1 person hand held assist       General Gait Details: Heavy dependence on UE support to take 2 march in place steps at EOB; Flatly declining amb  Stairs            Wheelchair Mobility    Modified Rankin (Stroke Patients Only)       Balance Overall balance  assessment: Needs assistance   Sitting balance-Leahy Scale: Good       Standing balance-Leahy Scale: Poor Standing balance comment: Heavy dependence on UE support in standing                             Pertinent Vitals/Pain Pain Assessment: Faces(previous chest pain relieved with nitro) Faces Pain Scale: Hurts a little bit Pain Location: reports generalized discomfort Pain Descriptors / Indicators: Discomfort Pain Intervention(s): Monitored during session    Home Living Family/patient expects to be discharged to:: Private residence Living Arrangements: Spouse/significant other;Children Available Help at Discharge: Family;Available 24 hours/day Type of Home: House Home Access: Stairs to enter Entrance Stairs-Rails: Right;Left;Can reach both Entrance Stairs-Number of Steps: 4 Home Layout: One level Home Equipment: Walker - 2 wheels;Grab bars - toilet;Grab bars - tub/shower;Cane - single point Additional Comments: home O2    Prior Function Level of Independence: Needs assistance   Gait / Transfers Assistance Needed: RW for amb  ADL's / Homemaking Assistance Needed: wife assist        Hand Dominance        Extremity/Trunk Assessment   Upper Extremity Assessment Upper Extremity Assessment: Generalized weakness    Lower Extremity Assessment Lower Extremity Assessment: Generalized weakness       Communication      Cognition Arousal/Alertness: Awake/alert Behavior  During Therapy: WFL for tasks assessed/performed Overall Cognitive Status: Within Functional Limits for tasks assessed                                        General Comments General comments (skin integrity, edema, etc.): session conducted on 3 L supplemental O2; O2 sats 98%`    Exercises     Assessment/Plan    PT Assessment Patient needs continued PT services  PT Problem List Decreased strength;Decreased activity tolerance;Decreased balance;Decreased  mobility;Decreased coordination;Decreased knowledge of use of DME;Decreased safety awareness;Decreased knowledge of precautions;Cardiopulmonary status limiting activity       PT Treatment Interventions DME instruction;Gait training;Stair training;Functional mobility training;Therapeutic activities;Therapeutic exercise;Balance training;Patient/family education    PT Goals (Current goals can be found in the Care Plan section)  Acute Rehab PT Goals Patient Stated Goal: wants to get hoem PT Goal Formulation: With patient Time For Goal Achievement: 08/10/18 Potential to Achieve Goals: Good    Frequency Min 3X/week   Barriers to discharge   He will likely refuse hallway amb; Would like to see how he manages after HD    Co-evaluation               AM-PAC PT "6 Clicks" Daily Activity  Outcome Measure Difficulty turning over in bed (including adjusting bedclothes, sheets and blankets)?: A Little Difficulty moving from lying on back to sitting on the side of the bed? : A Little Difficulty sitting down on and standing up from a chair with arms (e.g., wheelchair, bedside commode, etc,.)?: Unable Help needed moving to and from a bed to chair (including a wheelchair)?: A Little Help needed walking in hospital room?: A Lot Help needed climbing 3-5 steps with a railing? : A Lot 6 Click Score: 14    End of Session Equipment Utilized During Treatment: Gait belt;Oxygen Activity Tolerance: Patient tolerated treatment well Patient left: in bed;with call bell/phone within reach(sitting EOB) Nurse Communication: Mobility status PT Visit Diagnosis: Unsteadiness on feet (R26.81);Other abnormalities of gait and mobility (R26.89)    Time: 8101-7510 PT Time Calculation (min) (ACUTE ONLY): 21 min   Charges:   PT Evaluation $PT Eval Moderate Complexity: 1 Mod          Roney Marion, [place]  Acute Rehabilitation Services Pager [phone] Office [phone]   Colletta [place] 07/27/2018, 1:56  PM

## 2018-07-27 NOTE — Progress Notes (Signed)
Pt's CBG 490, Maximum coverage given, pt is in steriod MD was in bed side and aware Will check after an hour per MD  Palma Holter, RN

## 2018-07-27 NOTE — ED Notes (Signed)
[place] ,MD at bedside 

## 2018-07-27 NOTE — Progress Notes (Signed)
OT Cancellation Note  Patient Details Name: [name] MRN: [id number] DOB: 1941/06/26   Cancelled Treatment:    Reason Eval/Treat Not Completed: Other (comment)  Noted critical troponin value; Would like for Dr. Cathlean Sauer to weigh in re: physical activity before proceeding with OT eval. Will check on pt at a later time   Kari Baars, [place]  Payton Mccallum D 07/27/2018, 10:10 AM

## 2018-07-27 NOTE — Progress Notes (Signed)
Lab called with troponin critical- 0.32. NP notified. No chest pain.

## 2018-07-27 NOTE — Plan of Care (Signed)

## 2018-07-28 DIAGNOSIS — R0603 Acute respiratory distress: Secondary | ICD-10-CM

## 2018-07-28 LAB — GLUCOSE, CAPILLARY
GLUCOSE-CAPILLARY: 314 mg/dL — AB (ref 70–99)
GLUCOSE-CAPILLARY: 123 mg/dL — AB (ref 70–99)

## 2018-07-28 LAB — BASIC METABOLIC PANEL
Anion gap: 15 (ref 5–15)
BUN: 62 mg/dL — AB (ref 8–23)
CO2: 22 mmol/L (ref 22–32)
Calcium: 8.2 mg/dL — ABNORMAL LOW (ref 8.9–10.3)
Chloride: 94 mmol/L — ABNORMAL LOW (ref 98–111)
Creatinine, Ser: 5.97 mg/dL — ABNORMAL HIGH (ref 0.61–1.24)
GFR calc Af Amer: 9 mL/min — ABNORMAL LOW (ref >60)
GFR, EST NON AFRICAN AMERICAN: 8 mL/min — AB (ref >60)
Glucose, Bld: 330 mg/dL — ABNORMAL HIGH (ref 70–99)
POTASSIUM: 3.5 mmol/L (ref 3.5–5.1)
SODIUM: 131 mmol/L — AB (ref 135–145)

## 2018-07-28 LAB — CBC
HCT: 26.3 % — ABNORMAL LOW (ref 39.0–52.0)
Hemoglobin: 8.5 g/dL — ABNORMAL LOW (ref 13.0–17.0)
MCH: 29.3 pg (ref 26.0–34.0)
MCHC: 32.3 g/dL (ref 30.0–36.0)
MCV: 90.7 fL (ref 78.0–100.0)
PLATELETS: 226 10*3/uL (ref 150–400)
RBC: 2.9 MIL/uL — AB (ref 4.22–5.81)
RDW: 14.6 % (ref 11.5–15.5)
WBC: 9.6 10*3/uL (ref 4.0–10.5)

## 2018-07-28 MED ORDER — SEVELAMER CARBONATE 800 MG PO TABS: 2400.0000 mg | ORAL_TABLET | Freq: Three times a day (TID) | ORAL | 0 refills | Status: AC

## 2018-07-28 MED ORDER — FLUTICASONE-SALMETEROL 250-50 MCG/DOSE IN AEPB: 1.0000 | INHALATION_SPRAY | Freq: Two times a day (BID) | RESPIRATORY_TRACT | 0 refills | Status: DC

## 2018-07-28 MED ORDER — IPRATROPIUM-ALBUTEROL 0.5-2.5 (3) MG/3ML IN SOLN: 3.0000 mL | Freq: Four times a day (QID) | RESPIRATORY_TRACT | 0 refills | Status: DC | PRN

## 2018-07-28 MED ORDER — GUAIFENESIN-DM 100-10 MG/5ML PO SYRP: 5.0000 mL | ORAL_SOLUTION | Freq: Four times a day (QID) | ORAL | 0 refills | Status: DC | PRN

## 2018-07-28 MED ORDER — GUAIFENESIN-DM 100-10 MG/5ML PO SYRP
5.0000 mL | ORAL_SOLUTION | ORAL | Status: DC | PRN
  Administered 2018-07-28: 5 mL via ORAL
  Filled 2018-07-28: qty 5

## 2018-07-28 MED ORDER — AZITHROMYCIN 500 MG PO TABS: 500.0000 mg | ORAL_TABLET | Freq: Every day | ORAL | 0 refills | Status: DC

## 2018-07-28 NOTE — Discharge Summary (Signed)
Physician Discharge Summary  Jeffrey [name] OAC:[id number] DOB: 02-26-1941 DOA: 07/26/2018  PCP: Thurman Coyer, MD  Admit date: 07/26/2018 Discharge date: 07/28/2018  Admitted From: Home  Disposition:  Home   Recommendations for Outpatient Follow-up and new medication changes:  1. Follow up with Dr. Thea Silversmith in 7 days.  2. Patient has been placed on short acting bronchodilator as needed with duoneb. 3. Inhaled corticosteroid and long acting b agonist with advair. 4. Nebulizer machine  Home Health: no Equipment/Devices: nebulizer machine   Discharge Condition: stable  CODE STATUS: full  Diet recommendation: heart healthy and diabetic and renal prudent  Brief/Interim Summary: 77 year old male who presented with progressive dyspnea and worsening cough.  He does have significant past medical history for COPD with chronic hypoxic respiratory failure, end-stage renal disease on hemodialysis, (MWF), coronary artery disease, aortic stenosis status post TAVR, complete heart block status post pacemaker, and diastolic heart failure.  Reported 2 to 3 weeks of worsening cough and dyspnea, symptoms were refractive to outpatient therapy with antibiotics and steroids. On his initial physical examination blood pressure 122/53, heart rate 80, respiratory rate 21, temperature 99, oxygen saturation 95%.  Moist mucous membranes, positive JVD, heart S1-S2 present, rhythmic, no gallops, rubs or murmurs, lungs with positive expiratory wheezing, poor air movement, labored breathing, abdomen soft nontender, no significant lower extremity edema.  Left upper extremity AV fistula with thrill. Sodium 132, potassium 3.9, chloride 93, bicarb 25, glucose 237, BUN 43, creatinine 4.8, white count 10.5, Hb 10.2, hematocrit 32.4, platelets 261.  Chest x-ray with cardiomegaly, bibasilar atelectasis.  EKG with atrial sensing, V paced.   Patient was admitted to the hospital with working diagnosis of COPD exacerbation.  1. Acute  COPD exacerbation.  Patient was admitted to the medical ward, he was placed on remote telemetry monitor, oximetry monitoring and submental oxygen per nasal cannula.  He received aggressive bronchodilator therapy with albuterol/ipratropium, inhaled corticosteroids, long-acting beta-2 agonist, systemic steroids and macrolide antibiotic therapy.  He responded well to medical therapy and he was deemed stable to be discharged September 2.  Patient will continue as needed DuoNeb at home, routine inhaled steroids and long-acting beta-2 agonist.  Patient does have supplemental home oxygen.  His oxygen saturation at discharge is 100% on 3 LPM per nasal cannula.  Continue as needed guaifenesin DM for cough suppressive therapy.   2.  End-stage renal disease on hemodialysis.  Patient underwent hemodialysis with no major complications, no signs of volume overload or uremia, continue cinacalcet and sevelamer.  3.  Coronary artery disease, ischemic heart disease.  Mild elevation of troponin, not consistent with acute coronary syndrome, continue goal-directed medical therapy with clopidogrel, aspirin, atorvastatin.   4.  Type 2 diabetes mellitus with uncontrolled hyperglycemia.  Patient was placed on insulin sliding scale for glucose coverage and monitor, continue basal insulin regimen 40 units of Levemir daily.  Glucose remains elevated due to systemic steroids, methylprednisolone has been discontinued at discharge.  Continue close follow-up as an outpatient.  5.  Hypertension.  Continue blood pressure control with metoprolol.  6. Obesity. Calculated BMI 33,4, will need close follow up as outpatient.   Discharge Diagnoses:  Active Problems:   Respiratory distress    Discharge Instructions   Allergies as of 07/28/2018      Reactions   Byetta 10 Mcg Pen [exenatide] Diarrhea, Nausea And Vomiting   Codeine Itching   Coumadin [warfarin Sodium] Rash      Medication List    STOP taking these medications  amoxicillin-clavulanate 875-125 MG tablet Commonly known as:  AUGMENTIN   doxycycline 100 MG tablet Commonly known as:  VIBRA-TABS   predniSONE 10 MG tablet Commonly known as:  DELTASONE     TAKE these medications   albuterol 108 (90 Base) MCG/ACT inhaler Commonly known as:  PROVENTIL HFA;VENTOLIN HFA Inhale 2 puffs into the lungs every 6 (six) hours as needed for wheezing or shortness of breath.   aspirin EC 81 MG tablet Take 1 tablet (81 mg total) by mouth daily.   atorvastatin 40 MG tablet Commonly known as:  LIPITOR Take 2 tablets (80 mg total) by mouth daily.   azithromycin 500 MG tablet Commonly known as:  ZITHROMAX Take 1 tablet (500 mg total) by mouth daily for 4 days.   cinacalcet 30 MG tablet Commonly known as:  SENSIPAR Take 30 mg by mouth daily with supper.   clopidogrel 75 MG tablet Commonly known as:  PLAVIX Take 1 tablet (75 mg total) by mouth daily.   Fluticasone-Salmeterol 250-50 MCG/DOSE Aepb Commonly known as:  ADVAIR Inhale 1 puff into the lungs 2 (two) times daily.   guaiFENesin-dextromethorphan 100-10 MG/5ML syrup Commonly known as:  ROBITUSSIN DM Take 5 mLs by mouth every 6 (six) hours as needed for cough.   insulin detemir 100 UNIT/ML injection Commonly known as:  LEVEMIR Inject 0.5 mLs (50 Units total) into the skin at bedtime.   ipratropium-albuterol 0.5-2.5 (3) MG/3ML Soln Commonly known as:  DUONEB Take 3 mLs by nebulization every 6 (six) hours as needed.   lidocaine-prilocaine cream Commonly known as:  EMLA Apply 1 application topically once as needed (prior to accessing port).   metoprolol tartrate 25 MG tablet Commonly known as:  LOPRESSOR Take 1 tablet (25 mg total) by mouth 2 (two) times daily.   nitroGLYCERIN 0.4 MG SL tablet Commonly known as:  NITROSTAT Place 1 tablet (0.4 mg total) under the tongue every 5 (five) minutes as needed for chest pain.   omeprazole 20 MG capsule Commonly known as:  PRILOSEC Take 1  capsule (20 mg total) by mouth daily.   ranitidine 150 MG tablet Commonly known as:  ZANTAC Take 150 mg by mouth 2 (two) times daily as needed for heartburn.   sevelamer carbonate 800 MG tablet Commonly known as:  RENVELA Take 3 tablets (2,400 mg total) by mouth 3 (three) times daily with meals.   triamcinolone 0.1 % cream : eucerin Crea Apply 1 application topically 3 (three) times daily as needed. Apply to arms, back, and other areas with active rash/itching            Durable Medical Equipment  (From admission, onward)         Start     Ordered   07/28/18 0000  DME Nebulizer machine    Question:  Patient needs a nebulizer to treat with the following condition  Answer:  COPD exacerbation ([place])   07/28/18 1200          Allergies  Allergen Reactions  . Byetta 10 Mcg Pen [Exenatide] Diarrhea and Nausea And Vomiting  . Codeine Itching  . Coumadin [Warfarin Sodium] Rash    Consultations:  Nephrology    Procedures/Studies: Dg Chest 2 View  Result Date: 07/27/2018 CLINICAL DATA:  Shortness of breath and LEFT chest pain beginning at 6 p.m. History of CHF, COPD, TAVR. EXAM: CHEST - 2 VIEW COMPARISON:  Chest radiograph June 23, 2018 FINDINGS: Moderate cardiomegaly similar to prior examination. Status post aortic valve replacement. Calcified aortic arch. Mild  pulmonary vascular congestion and increasing interstitial prominence. Patchy retrocardiac consolidation. Small pleural effusions. Flattened hemidiaphragms. RIGHT cardiac pacemaker in situ. Calcifications in LEFT neck are likely vascular. Surgical clips in the included right abdomen compatible with cholecystectomy. IMPRESSION: 1. Stable cardiomegaly. Increasing interstitial prominence most compatible with pulmonary edema. Retrocardiac consolidation. Small pleural effusions. 2. Underlying suspected COPD. 3.  Aortic Atherosclerosis (ICD10-I70.0). Electronically Signed   By: Elon Alas M.D.   On: 07/27/2018 00:42        Subjective: Patient is feeling better, dyspnea has been stable and back to baseline, no chest pain, no nausea or vomiting.   Discharge Exam: Vitals:   07/28/18 1030 07/28/18 1100  BP: (!) 111/58 (!) 119/96  Pulse: 79 71  Resp:    Temp:    SpO2:     Vitals:   07/28/18 0930 07/28/18 1000 07/28/18 1030 07/28/18 1100  BP: (!) 100/40 (!) 119/57 (!) 111/58 (!) 119/96  Pulse: 79 84 79 71  Resp:      Temp:      TempSrc:      SpO2:      Weight:      Height:        General: Not in pain or dyspnea Neurology: Awake and alert, non focal  E ENT: no pallor, no icterus, oral mucosa moist Cardiovascular: No JVD. S1-S2 present, rhythmic, no gallops, rubs, or murmurs. No lower extremity edema. Pulmonary: positive breath sounds bilaterally, no significant wheezing, rhonchi or rales. Gastrointestinal. Abdomen with no organomegaly, non tender, no rebound or guarding Skin. No rashes Musculoskeletal: no joint deformities Left upper extremity av fistula.   The results of significant diagnostics from this hospitalization (including imaging, microbiology, ancillary and laboratory) are listed below for reference.     Microbiology: Recent Results (from the past 240 hour(s))  MRSA PCR Screening     Status: None   Collection Time: 07/27/18  3:27 AM  Result Value Ref Range Status   MRSA by PCR NEGATIVE NEGATIVE Final    Comment:        The GeneXpert MRSA Assay (FDA approved for NASAL specimens only), is one component of a comprehensive MRSA colonization surveillance program. It is not intended to diagnose MRSA infection nor to guide or monitor treatment for MRSA infections. Performed at [facility], [place] [address] [place], [place] [address]      Labs: BNP (last 3 results) Recent Labs    11/14/17 1238 07/27/18 0129  BNP 159.6* 2,536.6*   Basic Metabolic Panel: Recent Labs  Lab 07/26/18 2347 07/27/18 0206 07/27/18 0708 07/28/18 0401  NA 132*  --  132* 131*   K 3.9  --  3.8 3.5  CL 93*  --  97* 94*  CO2 25  --  21* 22  GLUCOSE 237*  --  321* 330*  BUN 43*  --  49* 62*  CREATININE 4.89* 5.18* 5.40* 5.97*  CALCIUM 8.9  --  8.4* 8.2*   Liver Function Tests: No results for input(s): AST, ALT, ALKPHOS, BILITOT, PROT, ALBUMIN in the last 168 hours. No results for input(s): LIPASE, AMYLASE in the last 168 hours. No results for input(s): AMMONIA in the last 168 hours. CBC: Recent Labs  Lab 07/26/18 2347 07/27/18 0206 07/27/18 0708 07/28/18 0846  WBC 10.5 9.2 7.2 9.6  NEUTROABS  --   --  6.5  --   HGB 10.2* 9.5* 9.5* 8.5*  HCT 32.4* 29.4* 29.7* 26.3*  MCV 93.1 92.5 93.4 90.7  PLT 261 219 187  226   Cardiac Enzymes: Recent Labs  Lab 07/27/18 0206 07/27/18 0708  TROPONINI 0.32* 0.23*   BNP: Invalid input(s): POCBNP CBG: Recent Labs  Lab 07/27/18 1131 07/27/18 1242 07/27/18 1638 07/27/18 2127 07/28/18 0735  GLUCAP 490* 432* 453* 329* 314*   D-Dimer No results for input(s): DDIMER in the last 72 hours. Hgb A1c No results for input(s): HGBA1C in the last 72 hours. Lipid Profile No results for input(s): CHOL, HDL, LDLCALC, TRIG, CHOLHDL, LDLDIRECT in the last 72 hours. Thyroid function studies No results for input(s): TSH, T4TOTAL, T3FREE, THYROIDAB in the last 72 hours.  Invalid input(s): FREET3 Anemia work up No results for input(s): VITAMINB12, FOLATE, FERRITIN, TIBC, IRON, RETICCTPCT in the last 72 hours. Urinalysis    Component Value Date/Time   COLORURINE YELLOW 06/17/2017 [place] 06/17/2017 0547   LABSPEC 1.010 06/17/2017 0547   PHURINE 6.0 06/17/2017 0547   GLUCOSEU >=500 (A) 06/17/2017 0547   HGBUR SMALL (A) 06/17/2017 0547   BILIRUBINUR NEGATIVE 06/17/2017 0547   KETONESUR NEGATIVE 06/17/2017 0547   PROTEINUR 100 (A) 06/17/2017 0547   UROBILINOGEN 0.2 09/28/2014 2257   NITRITE NEGATIVE 06/17/2017 0547   LEUKOCYTESUR TRACE (A) 06/17/2017 0547   Sepsis Labs Invalid input(s):  PROCALCITONIN,  WBC,  LACTICIDVEN Microbiology Recent Results (from the past 240 hour(s))  MRSA PCR Screening     Status: None   Collection Time: 07/27/18  3:27 AM  Result Value Ref Range Status   MRSA by PCR NEGATIVE NEGATIVE Final    Comment:        The GeneXpert MRSA Assay (FDA approved for NASAL specimens only), is one component of a comprehensive MRSA colonization surveillance program. It is not intended to diagnose MRSA infection nor to guide or monitor treatment for MRSA infections. Performed at [facility], [place] [address] [place], [place] [address]      Time coordinating discharge: 45 minutes  SIGNED:   Tawni Millers, MD  [facility] 07/28/2018, 11:40 AM Pager [phone]  If 7PM-7AM, please contact night-coverage [email or web address] Password [email or web address]

## 2018-07-28 NOTE — Progress Notes (Signed)
[facility] Progress Note  Subjective: alert no distress  Vitals:   07/28/18 0930 07/28/18 1000 07/28/18 1030 07/28/18 1100  BP: (!) 100/40 (!) 119/57 (!) 111/58 (!) 119/96  Pulse: 79 84 79 71  Resp:      Temp:      TempSrc:      SpO2:      Weight:      Height:        Inpatient medications: . aspirin EC  81 mg Oral Daily  . atorvastatin  80 mg Oral Daily  . azithromycin  500 mg Oral Daily  . cinacalcet  30 mg Oral Q M,W,F  . clopidogrel  75 mg Oral Daily  . heparin  5,000 Units Subcutaneous Q8H  . insulin aspart  0-15 Units Subcutaneous TID WC  . insulin detemir  40 Units Subcutaneous QHS  . ipratropium-albuterol  3 mL Nebulization Q6H  . methylPREDNISolone (SOLU-MEDROL) injection  40 mg Intravenous Daily  . metoprolol succinate  25 mg Oral Daily  . mometasone-formoterol  1 puff Inhalation BID  . multivitamin  1 tablet Oral QHS  . pantoprazole  40 mg Oral Daily  . sevelamer carbonate  2,400 mg Oral TID WC   . sodium chloride     sodium chloride, famotidine, ipratropium-albuterol, nitroGLYCERIN, triamcinolone 0.1 % cream : eucerin  Iron/TIBC/Ferritin/ %Sat    Component Value Date/Time   IRON 59 01/12/2015 0730   TIBC 227 01/12/2015 0730   FERRITIN 189 01/12/2015 0730   IRONPCTSAT 26 01/12/2015 0730    Exam: Alert, no distress on HD  no jvd  chest mostly clear bilat , occ rhonchi, raspy cough  cor reg no mrg  abd soft obese ntnd no ascites  ext no edema  nf ox 3   Dialysis: mwf nw  4h  105kg  2K/ 3.5Ca  Hep none  LUA AVF Hec 1 mcg IV/HD Mircera 60 mcg q 2 wks, last given 07/21/18 (last op hgb 10.5 (8/28)   Venofer  50mg  weely    Assessment/Plan 1. ESRD HD MWF.  Coming in under edw last 3 hd sessions, needs lowering of dry wt 2. Dyspnea - vol excess/ lean body wt loss/ pulm edema + COPD. Lower vol on HD today.   3. HTN - on metoprolol qd at home, only bp meds. BP's soft 4. Anemia ckd - hgb 9.5 ,Mircera  Given 07/21/18  Fu hgb trend ,  weekly fe on hd  5. Metabolic bone disease -  hec on hd ,Renvela as phos binder, Sensipar 30 mg on MWF, 6. HO CAD/ multi vess. NSTEMI  03/2018, not CABG candidate/ ICM EF 45-50%/ ho  PPM/ sp TAVR- CE neg , meds   7. HO DM type 2 -per admit 8. Nutrition - renal carb mod , renal vit    Plan - HD today, max UF as Ronnie Derby MD [employer] pager [phone]   07/28/2018, 11:25 AM   Recent Labs  Lab 07/27/18 0708 07/28/18 0401  NA 132* 131*  K 3.8 3.5  CL 97* 94*  CO2 21* 22  GLUCOSE 321* 330*  BUN 49* 62*  CREATININE 5.40* 5.97*  CALCIUM 8.4* 8.2*   No results for input(s): AST, ALT, ALKPHOS, BILITOT, PROT in the last 168 hours. Recent Labs  Lab 07/27/18 0708 07/28/18 0846  WBC 7.2 9.6  NEUTROABS 6.5  --   HGB 9.5* 8.5*  HCT 29.7* 26.3*  MCV 93.4 90.7  PLT 187 226

## 2018-07-28 NOTE — Progress Notes (Signed)
Nutrition Consult/Brief Note  RD consulted via COPD Focused Protocol.  Wt Readings from Last 15 Encounters:  07/28/18 108.7 kg  06/23/18 107 kg  05/27/18 107 kg  05/07/18 106.6 kg  04/18/18 112.9 kg  02/18/18 109.3 kg  11/14/17 107.5 kg  09/26/17 106.6 kg  06/17/17 95.3 kg  05/15/17 102.5 kg  05/03/17 99.6 kg  04/16/17 104.3 kg  03/27/17 105.1 kg  03/13/17 105 kg  01/31/17 108.5 kg   Body mass index is 33.42 kg/m. Patient meets criteria for Obesity Class I based on current BMI.   Current diet order is Renal, patient is consuming approximately 75-100% of meals at this time. Labs and medications reviewed.   No nutrition interventions warranted at this time. If nutrition issues arise, please consult RD.   Arthur Holms, RD, LDN Pager #: [phone] After-Hours Pager #: [phone]

## 2018-07-28 NOTE — Progress Notes (Signed)
OT Cancellation Note  Patient Details Name: FLINT HAKEEM MRN: [id number] DOB: 12/28/1940   Cancelled Treatment:    Reason Eval/Treat Not Completed: Patient at procedure or test/ unavailable. Pt currently in hemodialysis. [place], OTR/L Acute Rehab Services Pager [phone] Office [phone]    07/28/2018, 11:17 AM

## 2018-07-28 NOTE — Care Management Note (Signed)
Case Management Note CM Cross Coverage for 3E on Sept. 2 [phone]  Patient Details  Name: Jeffrey [name] MRN: [id number] Date of Birth: 27-Feb-1941  Subjective/Objective:   Pt admitted with COPD                 Action/Plan: PTA pt lived at home with wife, has home 58 with [place] for 3L-PRN. Pt for transition home today with wife to transport home. Orders placed for HHPT/OT- spoke with pt and wife at bedside- choice offered for [facility] agency- discussed orders for PT/OT- per wife and pt they do not feel [place] services would benefit at this time and decline referral. States he will f/u with PCP if needed later. Pt reports that he has been using his home 02 more frequently and has been told by [employer] that he needs new orders and qualifying notes- bedside RN has placed qualifying note and MD placed new 02- per pt's wife she did not bring 02 tank from home- have reached out to [place] with [employer] who will f/u on new 02 orders and have portable tank brought to room for transition home. Pt and wife aware along with bedside RN- pt will be ready for discharge once portable tank arrives.   Expected Discharge Date:  07/28/18               Expected Discharge Plan:  [place]  In-House Referral:     Discharge planning Services  CM Consult  Post Acute Care Choice:  Durable Medical Equipment, Home Health Choice offered to:  Patient, Spouse  DME Arranged:  Oxygen DME Agency:  [facility]  HH Arranged:  PT, OT, Patient Refused [place] Agency:     Status of Service:  Completed, signed off  If discussed at [place] of Stay Meetings, dates discussed:    Discharge Disposition: home/self care   Additional Comments:  Dawayne Patricia, RN 07/28/2018, 3:59 PM

## 2018-07-28 NOTE — Progress Notes (Signed)
SATURATION QUALIFICATIONS: (This note is used to comply with regulatory documentation for home oxygen)  Patient Saturations on Room Air at Rest = 92%  Patient Saturations on Room Air while Ambulating=77%   Patient Saturations on 3 Liters of oxygen while Ambulating 99%  Please briefly explain why patient needs home oxygen:

## 2018-07-28 NOTE — Progress Notes (Signed)
Patient discharged: Home with family with oxygen  Via: Wheelchair   Discharge paperwork given: to patient and family  Reviewed with teach back  IV and telemetry disconnected  Belongings given to patient

## 2018-07-30 ENCOUNTER — Other Ambulatory Visit: Payer: Self-pay

## 2018-07-30 ENCOUNTER — Encounter (HOSPITAL_COMMUNITY): Payer: Self-pay

## 2018-07-30 ENCOUNTER — Emergency Department (HOSPITAL_COMMUNITY): Payer: Medicare Other

## 2018-07-30 ENCOUNTER — Inpatient Hospital Stay (HOSPITAL_COMMUNITY)
Admission: EM | Admit: 2018-07-30 | Discharge: 2018-08-01 | DRG: 250 | Disposition: A | Discharge status: Home / Self Care | Payer: Medicare Other | Source: Ambulatory Visit | Attending: Family Medicine | Admitting: Family Medicine

## 2018-07-30 DIAGNOSIS — I480 Paroxysmal atrial fibrillation: Secondary | ICD-10-CM | POA: Diagnosis present

## 2018-07-30 DIAGNOSIS — I25110 Atherosclerotic heart disease of native coronary artery with unstable angina pectoris: Secondary | ICD-10-CM | POA: Diagnosis present

## 2018-07-30 DIAGNOSIS — Z9981 Dependence on supplemental oxygen: Secondary | ICD-10-CM

## 2018-07-30 DIAGNOSIS — Z9842 Cataract extraction status, left eye: Secondary | ICD-10-CM

## 2018-07-30 DIAGNOSIS — Z953 Presence of xenogenic heart valve: Secondary | ICD-10-CM | POA: Diagnosis not present

## 2018-07-30 DIAGNOSIS — I4891 Unspecified atrial fibrillation: Secondary | ICD-10-CM | POA: Diagnosis not present

## 2018-07-30 DIAGNOSIS — N186 End stage renal disease: Secondary | ICD-10-CM

## 2018-07-30 DIAGNOSIS — Z950 Presence of cardiac pacemaker: Secondary | ICD-10-CM

## 2018-07-30 DIAGNOSIS — Z79899 Other long term (current) drug therapy: Secondary | ICD-10-CM

## 2018-07-30 DIAGNOSIS — Z9049 Acquired absence of other specified parts of digestive tract: Secondary | ICD-10-CM

## 2018-07-30 DIAGNOSIS — J9611 Chronic respiratory failure with hypoxia: Secondary | ICD-10-CM | POA: Diagnosis present

## 2018-07-30 DIAGNOSIS — Z87891 Personal history of nicotine dependence: Secondary | ICD-10-CM

## 2018-07-30 DIAGNOSIS — E785 Hyperlipidemia, unspecified: Secondary | ICD-10-CM | POA: Diagnosis present

## 2018-07-30 DIAGNOSIS — I214 Non-ST elevation (NSTEMI) myocardial infarction: Secondary | ICD-10-CM | POA: Diagnosis present

## 2018-07-30 DIAGNOSIS — T82858A Stenosis of vascular prosthetic devices, implants and grafts, initial encounter: Secondary | ICD-10-CM | POA: Diagnosis present

## 2018-07-30 DIAGNOSIS — Y838 Other surgical procedures as the cause of abnormal reaction of the patient, or of later complication, without mention of misadventure at the time of the procedure: Secondary | ICD-10-CM | POA: Diagnosis present

## 2018-07-30 DIAGNOSIS — N2581 Secondary hyperparathyroidism of renal origin: Secondary | ICD-10-CM | POA: Diagnosis present

## 2018-07-30 DIAGNOSIS — Z885 Allergy status to narcotic agent status: Secondary | ICD-10-CM

## 2018-07-30 DIAGNOSIS — Z992 Dependence on renal dialysis: Secondary | ICD-10-CM

## 2018-07-30 DIAGNOSIS — Z888 Allergy status to other drugs, medicaments and biological substances status: Secondary | ICD-10-CM

## 2018-07-30 DIAGNOSIS — I2510 Atherosclerotic heart disease of native coronary artery without angina pectoris: Secondary | ICD-10-CM | POA: Diagnosis not present

## 2018-07-30 DIAGNOSIS — Z833 Family history of diabetes mellitus: Secondary | ICD-10-CM

## 2018-07-30 DIAGNOSIS — E11649 Type 2 diabetes mellitus with hypoglycemia without coma: Secondary | ICD-10-CM

## 2018-07-30 DIAGNOSIS — I5042 Chronic combined systolic (congestive) and diastolic (congestive) heart failure: Secondary | ICD-10-CM | POA: Diagnosis present

## 2018-07-30 DIAGNOSIS — I132 Hypertensive heart and chronic kidney disease with heart failure and with stage 5 chronic kidney disease, or end stage renal disease: Secondary | ICD-10-CM | POA: Diagnosis present

## 2018-07-30 DIAGNOSIS — Z8711 Personal history of peptic ulcer disease: Secondary | ICD-10-CM

## 2018-07-30 DIAGNOSIS — Z8673 Personal history of transient ischemic attack (TIA), and cerebral infarction without residual deficits: Secondary | ICD-10-CM | POA: Diagnosis not present

## 2018-07-30 DIAGNOSIS — I10 Essential (primary) hypertension: Secondary | ICD-10-CM | POA: Diagnosis present

## 2018-07-30 DIAGNOSIS — Z8249 Family history of ischemic heart disease and other diseases of the circulatory system: Secondary | ICD-10-CM

## 2018-07-30 DIAGNOSIS — Z794 Long term (current) use of insulin: Secondary | ICD-10-CM | POA: Diagnosis not present

## 2018-07-30 DIAGNOSIS — F40240 Claustrophobia: Secondary | ICD-10-CM | POA: Diagnosis present

## 2018-07-30 DIAGNOSIS — E1151 Type 2 diabetes mellitus with diabetic peripheral angiopathy without gangrene: Secondary | ICD-10-CM | POA: Diagnosis present

## 2018-07-30 DIAGNOSIS — E669 Obesity, unspecified: Secondary | ICD-10-CM | POA: Diagnosis present

## 2018-07-30 DIAGNOSIS — E7800 Pure hypercholesterolemia, unspecified: Secondary | ICD-10-CM | POA: Diagnosis not present

## 2018-07-30 DIAGNOSIS — D509 Iron deficiency anemia, unspecified: Secondary | ICD-10-CM | POA: Diagnosis present

## 2018-07-30 DIAGNOSIS — I252 Old myocardial infarction: Secondary | ICD-10-CM

## 2018-07-30 DIAGNOSIS — Z7982 Long term (current) use of aspirin: Secondary | ICD-10-CM

## 2018-07-30 DIAGNOSIS — Z9841 Cataract extraction status, right eye: Secondary | ICD-10-CM

## 2018-07-30 DIAGNOSIS — R55 Syncope and collapse: Secondary | ICD-10-CM | POA: Diagnosis not present

## 2018-07-30 DIAGNOSIS — E1122 Type 2 diabetes mellitus with diabetic chronic kidney disease: Secondary | ICD-10-CM | POA: Diagnosis present

## 2018-07-30 DIAGNOSIS — Z820 Family history of epilepsy and other diseases of the nervous system: Secondary | ICD-10-CM

## 2018-07-30 DIAGNOSIS — J441 Chronic obstructive pulmonary disease with (acute) exacerbation: Secondary | ICD-10-CM | POA: Diagnosis present

## 2018-07-30 DIAGNOSIS — Z7902 Long term (current) use of antithrombotics/antiplatelets: Secondary | ICD-10-CM

## 2018-07-30 DIAGNOSIS — Z6833 Body mass index (BMI) 33.0-33.9, adult: Secondary | ICD-10-CM

## 2018-07-30 DIAGNOSIS — D631 Anemia in chronic kidney disease: Secondary | ICD-10-CM | POA: Diagnosis present

## 2018-07-30 DIAGNOSIS — I442 Atrioventricular block, complete: Secondary | ICD-10-CM | POA: Diagnosis present

## 2018-07-30 DIAGNOSIS — Z955 Presence of coronary angioplasty implant and graft: Secondary | ICD-10-CM

## 2018-07-30 DIAGNOSIS — R011 Cardiac murmur, unspecified: Secondary | ICD-10-CM | POA: Diagnosis present

## 2018-07-30 DIAGNOSIS — Z961 Presence of intraocular lens: Secondary | ICD-10-CM | POA: Diagnosis present

## 2018-07-30 LAB — I-STAT TROPONIN, ED: Troponin i, poc: 1.28 ng/mL (ref 0.00–0.08)

## 2018-07-30 LAB — COMPREHENSIVE METABOLIC PANEL
ALBUMIN: 3 g/dL — AB (ref 3.5–5.0)
ALT: 14 U/L (ref 0–44)
AST: 19 U/L (ref 15–41)
Alkaline Phosphatase: 67 U/L (ref 38–126)
Anion gap: 13 (ref 5–15)
BUN: 27 mg/dL — ABNORMAL HIGH (ref 8–23)
CHLORIDE: 95 mmol/L — AB (ref 98–111)
CO2: 28 mmol/L (ref 22–32)
CREATININE: 3.35 mg/dL — AB (ref 0.61–1.24)
Calcium: 9.1 mg/dL (ref 8.9–10.3)
GFR calc non Af Amer: 16 mL/min — ABNORMAL LOW (ref >60)
GFR, EST AFRICAN AMERICAN: 19 mL/min — AB (ref >60)
GLUCOSE: 140 mg/dL — AB (ref 70–99)
Potassium: 3.7 mmol/L (ref 3.5–5.1)
SODIUM: 136 mmol/L (ref 135–145)
Total Bilirubin: 1.1 mg/dL (ref 0.3–1.2)
Total Protein: 6.1 g/dL — ABNORMAL LOW (ref 6.5–8.1)

## 2018-07-30 LAB — HEPARIN LEVEL (UNFRACTIONATED): Heparin Unfractionated: 0.11 IU/mL — ABNORMAL LOW (ref 0.30–0.70)

## 2018-07-30 LAB — CBC
HEMATOCRIT: 27.9 % — AB (ref 39.0–52.0)
HEMOGLOBIN: 8.9 g/dL — AB (ref 13.0–17.0)
MCH: 29.5 pg (ref 26.0–34.0)
MCHC: 31.9 g/dL (ref 30.0–36.0)
MCV: 92.4 fL (ref 78.0–100.0)
PLATELETS: 233 10*3/uL (ref 150–400)
RBC: 3.02 MIL/uL — AB (ref 4.22–5.81)
RDW: 15.3 % (ref 11.5–15.5)
WBC: 9.8 10*3/uL (ref 4.0–10.5)

## 2018-07-30 LAB — GLUCOSE, CAPILLARY
Glucose-Capillary: 263 mg/dL — ABNORMAL HIGH (ref 70–99)
Glucose-Capillary: 179 mg/dL — ABNORMAL HIGH (ref 70–99)

## 2018-07-30 LAB — TROPONIN I
Troponin I: 2.75 ng/mL (ref <0.03)
Troponin I: 3.37 ng/mL (ref <0.03)

## 2018-07-30 LAB — BRAIN NATRIURETIC PEPTIDE: B Natriuretic Peptide: 3146 pg/mL — ABNORMAL HIGH (ref 0.0–100.0)

## 2018-07-30 MED ORDER — INSULIN ASPART 100 UNIT/ML ~~LOC~~ SOLN
0.0000 [IU] | Freq: Three times a day (TID) | SUBCUTANEOUS | Status: DC
  Administered 2018-07-30: 8 [IU] via SUBCUTANEOUS
  Administered 2018-07-31: 3 [IU] via SUBCUTANEOUS
  Administered 2018-07-31 – 2018-08-01 (×2): 2 [IU] via SUBCUTANEOUS
  Administered 2018-08-01: 5 [IU] via SUBCUTANEOUS

## 2018-07-30 MED ORDER — LEVALBUTEROL HCL 0.63 MG/3ML IN NEBU: 0.6300 mg | INHALATION_SOLUTION | Freq: Once | RESPIRATORY_TRACT | Status: DC

## 2018-07-30 MED ORDER — INSULIN DETEMIR 100 UNIT/ML ~~LOC~~ SOLN
50.0000 [IU] | Freq: Every day | SUBCUTANEOUS | Status: DC
  Administered 2018-07-30: 50 [IU] via SUBCUTANEOUS
  Filled 2018-07-30 (×4): qty 0.5

## 2018-07-30 MED ORDER — CALCIUM CARBONATE ANTACID 1250 MG/5ML PO SUSP
500.0000 mg | Freq: Four times a day (QID) | ORAL | Status: DC | PRN
  Filled 2018-07-30: qty 5

## 2018-07-30 MED ORDER — NEPRO/CARBSTEADY PO LIQD
237.0000 mL | Freq: Three times a day (TID) | ORAL | Status: DC | PRN
  Filled 2018-07-30: qty 237

## 2018-07-30 MED ORDER — ONDANSETRON HCL 4 MG PO TABS: 4.0000 mg | ORAL_TABLET | Freq: Four times a day (QID) | ORAL | Status: DC | PRN

## 2018-07-30 MED ORDER — ACETAMINOPHEN 650 MG RE SUPP: 650.0000 mg | Freq: Four times a day (QID) | RECTAL | Status: DC | PRN

## 2018-07-30 MED ORDER — ACETAMINOPHEN 325 MG PO TABS: 650.0000 mg | ORAL_TABLET | Freq: Four times a day (QID) | ORAL | Status: DC | PRN

## 2018-07-30 MED ORDER — SODIUM CHLORIDE 0.9% FLUSH: 3.0000 mL | INTRAVENOUS | Status: DC | PRN

## 2018-07-30 MED ORDER — SODIUM CHLORIDE 0.9 % IV SOLN: INTRAVENOUS | Status: DC

## 2018-07-30 MED ORDER — INSULIN ASPART 100 UNIT/ML ~~LOC~~ SOLN: 0.0000 [IU] | Freq: Every day | SUBCUTANEOUS | Status: DC

## 2018-07-30 MED ORDER — CINACALCET HCL 30 MG PO TABS
30.0000 mg | ORAL_TABLET | Freq: Every day | ORAL | Status: DC
  Administered 2018-07-30 – 2018-07-31 (×2): 30 mg via ORAL
  Filled 2018-07-30 (×2): qty 1

## 2018-07-30 MED ORDER — HEPARIN (PORCINE) IN NACL 100-0.45 UNIT/ML-% IJ SOLN
1900.0000 [IU]/h | INTRAMUSCULAR | Status: DC
  Administered 2018-07-30: 1600 [IU]/h via INTRAVENOUS
  Administered 2018-07-31: 1900 [IU]/h via INTRAVENOUS
  Filled 2018-07-30 (×2): qty 250

## 2018-07-30 MED ORDER — ASPIRIN 81 MG PO CHEW
81.0000 mg | CHEWABLE_TABLET | ORAL | Status: AC
  Administered 2018-07-31: 81 mg via ORAL
  Filled 2018-07-30: qty 1

## 2018-07-30 MED ORDER — PANTOPRAZOLE SODIUM 40 MG PO TBEC
40.0000 mg | DELAYED_RELEASE_TABLET | Freq: Every day | ORAL | Status: DC
  Administered 2018-07-30 – 2018-08-01 (×3): 40 mg via ORAL
  Filled 2018-07-30 (×3): qty 1

## 2018-07-30 MED ORDER — SEVELAMER CARBONATE 800 MG PO TABS
2400.0000 mg | ORAL_TABLET | Freq: Three times a day (TID) | ORAL | Status: DC
  Administered 2018-07-30 – 2018-08-01 (×3): 2400 mg via ORAL
  Filled 2018-07-30 (×3): qty 3

## 2018-07-30 MED ORDER — NITROGLYCERIN 0.4 MG SL SUBL: 0.4000 mg | SUBLINGUAL_TABLET | SUBLINGUAL | Status: DC | PRN

## 2018-07-30 MED ORDER — SORBITOL 70 % SOLN
30.0000 mL | Status: DC | PRN
  Filled 2018-07-30: qty 30

## 2018-07-30 MED ORDER — METOPROLOL TARTRATE 25 MG PO TABS
25.0000 mg | ORAL_TABLET | Freq: Two times a day (BID) | ORAL | Status: DC
  Administered 2018-07-30 – 2018-08-01 (×3): 25 mg via ORAL
  Filled 2018-07-30 (×3): qty 1

## 2018-07-30 MED ORDER — MOMETASONE FURO-FORMOTEROL FUM 200-5 MCG/ACT IN AERO
2.0000 | INHALATION_SPRAY | Freq: Two times a day (BID) | RESPIRATORY_TRACT | Status: DC
  Administered 2018-07-30 – 2018-07-31 (×3): 2 via RESPIRATORY_TRACT
  Filled 2018-07-30: qty 8.8

## 2018-07-30 MED ORDER — HEPARIN BOLUS VIA INFUSION
4000.0000 [IU] | Freq: Once | INTRAVENOUS | Status: AC
  Administered 2018-07-30: 4000 [IU] via INTRAVENOUS
  Filled 2018-07-30: qty 4000

## 2018-07-30 MED ORDER — IPRATROPIUM-ALBUTEROL 0.5-2.5 (3) MG/3ML IN SOLN
3.0000 mL | RESPIRATORY_TRACT | Status: DC | PRN
  Administered 2018-07-30: 3 mL via RESPIRATORY_TRACT
  Filled 2018-07-30: qty 3

## 2018-07-30 MED ORDER — ONDANSETRON HCL 4 MG/2ML IJ SOLN
4.0000 mg | Freq: Four times a day (QID) | INTRAMUSCULAR | Status: DC | PRN
  Filled 2018-07-30: qty 2

## 2018-07-30 MED ORDER — SODIUM CHLORIDE 0.9 % IV SOLN: 250.0000 mL | INTRAVENOUS | Status: DC | PRN

## 2018-07-30 MED ORDER — CLOPIDOGREL BISULFATE 75 MG PO TABS
75.0000 mg | ORAL_TABLET | Freq: Every day | ORAL | Status: DC
  Administered 2018-07-30 – 2018-08-01 (×3): 75 mg via ORAL
  Filled 2018-07-30 (×3): qty 1

## 2018-07-30 MED ORDER — DOCUSATE SODIUM 283 MG RE ENEM
1.0000 | ENEMA | RECTAL | Status: DC | PRN
  Filled 2018-07-30: qty 1

## 2018-07-30 MED ORDER — ASPIRIN 81 MG PO CHEW: 324.0000 mg | CHEWABLE_TABLET | Freq: Once | ORAL | Status: DC

## 2018-07-30 MED ORDER — GUAIFENESIN-DM 100-10 MG/5ML PO SYRP: 5.0000 mL | ORAL_SOLUTION | Freq: Four times a day (QID) | ORAL | Status: DC | PRN

## 2018-07-30 MED ORDER — HEPARIN BOLUS VIA INFUSION
2000.0000 [IU] | Freq: Once | INTRAVENOUS | Status: AC
  Administered 2018-07-30: 2000 [IU] via INTRAVENOUS
  Filled 2018-07-30: qty 2000

## 2018-07-30 MED ORDER — ASPIRIN EC 81 MG PO TBEC
81.0000 mg | DELAYED_RELEASE_TABLET | Freq: Every day | ORAL | Status: DC
  Administered 2018-07-31 – 2018-08-01 (×2): 81 mg via ORAL
  Filled 2018-07-30 (×2): qty 1

## 2018-07-30 MED ORDER — ATORVASTATIN CALCIUM 80 MG PO TABS
80.0000 mg | ORAL_TABLET | Freq: Every day | ORAL | Status: DC
  Administered 2018-07-30 – 2018-08-01 (×3): 80 mg via ORAL
  Filled 2018-07-30 (×3): qty 1

## 2018-07-30 MED ORDER — FAMOTIDINE 20 MG PO TABS
20.0000 mg | ORAL_TABLET | Freq: Every day | ORAL | Status: DC
  Administered 2018-07-30 – 2018-08-01 (×3): 20 mg via ORAL
  Filled 2018-07-30 (×3): qty 1

## 2018-07-30 MED ORDER — CAMPHOR-MENTHOL 0.5-0.5 % EX LOTN: 1.0000 "application " | TOPICAL_LOTION | Freq: Three times a day (TID) | CUTANEOUS | Status: DC | PRN

## 2018-07-30 MED ORDER — ZOLPIDEM TARTRATE 5 MG PO TABS: 5.0000 mg | ORAL_TABLET | Freq: Every evening | ORAL | Status: DC | PRN

## 2018-07-30 MED ORDER — METHYLPREDNISOLONE SODIUM SUCC 125 MG IJ SOLR: 125.0000 mg | Freq: Once | INTRAMUSCULAR | Status: DC

## 2018-07-30 MED ORDER — HYDROXYZINE HCL 25 MG PO TABS
25.0000 mg | ORAL_TABLET | Freq: Three times a day (TID) | ORAL | Status: DC | PRN
  Filled 2018-07-30: qty 1

## 2018-07-30 MED ORDER — AZITHROMYCIN 500 MG PO TABS
500.0000 mg | ORAL_TABLET | Freq: Every day | ORAL | Status: DC
  Administered 2018-07-31 – 2018-08-01 (×2): 500 mg via ORAL
  Filled 2018-07-30 (×4): qty 1

## 2018-07-30 MED ORDER — SODIUM CHLORIDE 0.9% FLUSH: 3.0000 mL | Freq: Two times a day (BID) | INTRAVENOUS | Status: DC

## 2018-07-30 MED ORDER — SODIUM CHLORIDE 0.9% FLUSH
3.0000 mL | Freq: Two times a day (BID) | INTRAVENOUS | Status: DC
  Administered 2018-07-31: 3 mL via INTRAVENOUS

## 2018-07-30 NOTE — ED Notes (Signed)
Attempted to call report

## 2018-07-30 NOTE — Progress Notes (Signed)
ANTICOAGULATION CONSULT NOTE - Initial Consult  Pharmacy Consult for heparin Indication: chest pain/ACS  Allergies  Allergen Reactions  . Byetta 10 Mcg Pen [Exenatide] Diarrhea and Nausea And Vomiting  . Codeine Itching  . Coumadin [Warfarin Sodium] Rash    Patient Measurements: Height: 5\' 11"  (180.3 cm) Weight: 236 lb (107 kg) IBW/kg (Calculated) : 75.3 Heparin Dosing Weight: 98kg  Vital Signs: Temp: 97.7 F (36.5 C) (09/04 0935) Temp Source: Oral (09/04 0935) BP: 116/42 (09/04 1230) Pulse Rate: 53 (09/04 1230)  Labs: Recent Labs    07/28/18 0401 07/28/18 0846 07/30/18 0946 07/30/18 0955  HGB  --  8.5* 8.9*  --   HCT  --  26.3* 27.9*  --   PLT  --  226 233  --   CREATININE 5.97*  --   --  3.35*    Estimated Creatinine Clearance: 22.3 mL/min (A) (by C-G formula based on SCr of 3.35 mg/dL (H)).   Medical History: Past Medical History:  Diagnosis Date  . Anginal pain ([place])   . Aortic stenosis    a. severe by echo 09/2014  . Atrial fibrillation ([place])    a. not well documented, not on anticoagulation  . CHF (congestive heart failure) ([place])    04/28/17 echo-EF 40%, mod LVH, diastolic dysfunction  . Claustrophobia   . Complete heart block ([place])   . COPD (chronic obstructive pulmonary disease) ([place])   . Coronary artery disease    a. chronically occluded RCA per cath 09/2014 with collaterals B. cath 05/01/17 chr occ RCA w/collaterals, 60-70% mid LAD,   . CVA (cerebral vascular accident) ([place]) 10/2014   denies residual on 07/11/2015  . ESRD (end stage renal disease) on dialysis [facility])    a. on dialysis; [facility]; MWF, LUE fistula (07/11/2015)  . History of blood transfusion    "related to gallbladder OR"  . History of stomach ulcers   . Hyperlipidemia   . Hypertension   . Iron deficiency anemia   . Myocardial infarction ([place]) 10/2014  . Peripheral vascular disease ([place])   . Pneumonia   . Presence of permanent cardiac pacemaker   . S/P TAVR (transcatheter  aortic valve replacement) 08/02/2015   29 mm Edwards Sapien 3 transcatheter heart valve placed via open left transfemoral approach  . Type II diabetes mellitus ([facility])     Medications:  Infusions:  . heparin      Assessment: 16 yom presented to the ED with CP. Troponin elevated and now starting IV heparin. Baseline Hgb low at 8.9 and platelets are WNL. Noted history of afib but pt is not on anticoagulation PTA.   Goal of Therapy:  Heparin level 0.3-0.7 units/ml Monitor platelets by anticoagulation protocol: Yes   Plan:  Heparin bolus 4000 units IV x 1 Heparin gtt 1600 units/hr Check an 8 hr heparin level Daily heparin level and CBC   Lavene Penagos, Rande Lawman 07/30/2018,12:54 PM

## 2018-07-30 NOTE — ED Notes (Signed)
Pacer interrogated with different medtronic equipment and still unable tofind device.

## 2018-07-30 NOTE — Consult Note (Addendum)
Cardiology Consultation:   Patient ID: Jeffrey [name]; [id number]; November 01, 1941   Admit date: 07/30/2018 Date of Consult: 07/30/2018  Primary Care Provider: Thurman Coyer, MD Primary Cardiologist: [name], MD  Primary Electrophysiologist:  Dr. Caryl Comes    Patient Profile:   Jeffrey [name] is a 77 y.o. male with a hx of complete heart block s/p PPM followed by Dr. Caryl Comes, ESRD on HD, DMT2, PVD, multivessel CAD turned down for CABG, s/p recent NSTEMI w/ PCI to LAD and LCx 03/2018, h/o aortic stenosis s/p TAVR in 2016, LV dysfunction (EF 45-50%) paroxysmal Afib no longer on coumadin due to side effects (rash) and COPD w/ chronic hypoxic respiratory failure, who is being seen today for evaluation of chest pain and elevated troponin, at the request of Dr. Lita Mains, Emergency Department.  History of Present Illness:   Jeffrey [name] is a 77 y.o. male with a hx of complete heart block s/p PPM followed by Dr. Caryl Comes, ESRD on HD, DMT2, PVD, multivessel CAD turned down for CABG, s/p recent NSTEMI w/ PCI to LAD and LCx 03/2018, h/o aortic stenosis s/p TAVR in 2016, LV dysfunction (EF 45-50%), paroxysmal Afib no longer on coumadin due to side effects (rash) and COPD w/ chronic hypoxic respiratory failure, who is being seen today for evaluation of chest pain and elevated troponin, at the request of Dr. Lita Mains, Emergency Department.  Most recently, in May of this year, pt was admitted for NSTEMI. Troponin that admission peaked at 0.53. Cardiac cath on 04/14/18 showed significant multivessel coronary calcification with 3 vessel CAD, and 60% proximal LAD stenosis, 70% calcified stenosis between the first and second diagonal vessel; dominant left circumflex coronary artery with new 90-95% ostial stenosis and diffuse 30% mid stenosis; total chronic mid RCA occlusion.  There was left to right collateralization to the distal RCA. CT surgery was consulted for consideration for CABG however he was seen by Dr. Roxy Manns and was  felt too high risk for surgical revascularization. Subsequently, he was taken back to the cath lab on 04/18/18 and underwent Successful PTCA/orbital atherectomy/DES x 1 proximal to mid LAD and PTCA/orbital atherectomy/DES x 1 ostium Circumflex. He was placed on DAPT w/ ASA + Plavix.  He was readmitted by Internal Medicine from 07/26/18-07/28/18 for Acute COPD exacerbation. His CC was as 2-3 week h/o worsening cough and dyspnea, refractory to outpatient antibiotics and steroids. POC troponin was checked and was abnormal at 0.21. However per notes, it was felt that his mild troponin elevation was not c/w ACS. No cardiac w/u was persued and continued medical therapy for CAD recommended. For his COPD, he received aggressive bronchodilator therapy with albuterol/ipratropium, inhaled corticosteroids, long-acting beta-2 agonist, systemic steroids and macrolide antibiotic therapy.  He responded well to medical therapy and he was deemed stable to be discharged September 2. He was discharged on supplemental Home O2 via [place].  Pt now returns back to the [facility] ED with CC of chest pain. Pt notes an episode of chest pain last night while at home. He got up to walk to the bathroom and did not have his oxygen on. He developed severe exertional CP and dyspnea that eased off with nitro and after resuming is O2. He developed another severe episode of chest pain while at HD today. His chest pain was substernal with radiation to his anterior neck, back and jaw/gums. Felt similar to his previous angina. Again, he was w/o supplemental O2 when symptoms developed. He took SL NTG which relieved the pain. He notified the HD  RN and was given additional SL NTG, ASA and O2 and transported to the [facility]. He is currently CP free. He reports being fully compliant w/ DAPT.   In the ED, POC troponin is elevated at 1.28 (much higher from recent NSTEMI 03/2018, when troponin peaked at 0.53). Cardiology asked to evaluate.     Past Medical History:    Diagnosis Date  . Anginal pain ([place])   . Aortic stenosis    a. severe by echo 09/2014  . Atrial fibrillation ([place])    a. not well documented, not on anticoagulation  . CHF (congestive heart failure) ([place])    04/28/17 echo-EF 40%, mod LVH, diastolic dysfunction  . Claustrophobia   . Complete heart block ([place])   . COPD (chronic obstructive pulmonary disease) ([place])   . Coronary artery disease    a. chronically occluded RCA per cath 09/2014 with collaterals B. cath 05/01/17 chr occ RCA w/collaterals, 60-70% mid LAD,   . CVA (cerebral vascular accident) ([place]) 10/2014   denies residual on 07/11/2015  . ESRD (end stage renal disease) on dialysis [facility])    a. on dialysis; [facility]; MWF, LUE fistula (07/11/2015)  . History of blood transfusion    "related to gallbladder OR"  . History of stomach ulcers   . Hyperlipidemia   . Hypertension   . Iron deficiency anemia   . Myocardial infarction ([place]) 10/2014  . Peripheral vascular disease ([place])   . Pneumonia   . Presence of permanent cardiac pacemaker   . 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 Type II diabetes mellitus ([place])     Past Surgical History:  Procedure Laterality Date  . AV FISTULA PLACEMENT Left 10/19/2014   Procedure: BRACHIOCEPHALIC ARTERIOVENOUS (AV) FISTULA CREATION ;  Surgeon: Conrad Nunda, MD;  Location: [facility];  Service: Vascular;  Laterality: Left;  . CARDIAC CATHETERIZATION    . CARDIAC CATHETERIZATION N/A 07/22/2015   Procedure: Right/Left Heart Cath and Coronary Angiography;  Surgeon: [name], MD;  Location: [place] CV LAB;  Service: Cardiovascular;  Laterality: N/A;  . CATARACT EXTRACTION W/ INTRAOCULAR LENS  IMPLANT, BILATERAL Bilateral 1990's  . CHOLECYSTECTOMY OPEN  1980's  . COLONOSCOPY W/ BIOPSIES AND POLYPECTOMY    . CORONARY ANGIOPLASTY    . CORONARY ATHERECTOMY N/A  04/18/2018   Procedure: CORONARY ATHERECTOMY;  Surgeon: [name], MD;  Location: [place] CV LAB;  Service: Cardiovascular;  Laterality: N/A;  . CORONARY STENT INTERVENTION N/A 04/18/2018   Procedure: CORONARY STENT INTERVENTION;  Surgeon: [name], MD;  Location: [place] CV LAB;  Service: Cardiovascular;  Laterality: N/A;  . EP IMPLANTABLE DEVICE N/A 07/11/2015   Procedure: Pacemaker Implant;  Surgeon: [name], MD;  Location: [place] CV LAB;  Service: Cardiovascular;  Laterality: N/A;  . ESOPHAGOGASTRODUODENOSCOPY  08/01/2012   Procedure: ESOPHAGOGASTRODUODENOSCOPY (EGD);  Surgeon: [name], MD;  Location: [facility];  Service: Endoscopy;  Laterality: N/A;  . INSERT / REPLACE / REMOVE PACEMAKER  07/11/2015  . INSERTION OF DIALYSIS CATHETER Right 02/02/2015   Procedure: INSERTION OF DIALYSIS CATHETER  RIGHT INTERNAL JUGULAR;  Surgeon: Mal [name], MD;  Location: [facility];  Service: Vascular;  Laterality: Right;  . LEFT AND RIGHT HEART CATHETERIZATION WITH CORONARY ANGIOGRAM N/A 09/30/2014   Procedure: LEFT AND RIGHT HEART CATHETERIZATION WITH CORONARY ANGIOGRAM;  Surgeon: [name], MD;  Location: [facility]  CATH LAB;  Service: Cardiovascular;  Laterality: N/A;  . LEFT HEART CATH AND CORONARY ANGIOGRAPHY N/A 04/14/2018   Procedure: LEFT HEART CATH AND CORONARY ANGIOGRAPHY;  Surgeon: [name], MD;  Location: [place] CV LAB;  Service: Cardiovascular;  Laterality: N/A;  . TEE WITHOUT CARDIOVERSION N/A 08/02/2015   Procedure: TRANSESOPHAGEAL ECHOCARDIOGRAM (TEE);  Surgeon: [name], MD;  Location: [facility];  Service: Open Heart Surgery;  Laterality: N/A;  . TONSILLECTOMY    . TRANSCATHETER AORTIC VALVE REPLACEMENT, TRANSFEMORAL Left 08/02/2015   Procedure: TRANSCATHETER AORTIC VALVE REPLACEMENT, TRANSFEMORAL;  Surgeon: [name], MD;  Location: [facility];  Service: Open Heart Surgery;  Laterality: Left;     Home Medications:  Prior to  Admission medications   Medication Sig Start Date End Date Taking? Authorizing Provider  albuterol (PROVENTIL HFA;VENTOLIN HFA) 108 (90 Base) MCG/ACT inhaler Inhale 2 puffs into the lungs every 6 (six) hours as needed for wheezing or shortness of breath. 04/19/18  Yes Rai, Ripudeep K, MD  aspirin EC 81 MG tablet Take 1 tablet (81 mg total) by mouth daily. 05/15/17  Yes Eileen Stanford, PA-C  atorvastatin (LIPITOR) 40 MG tablet Take 2 tablets (80 mg total) by mouth daily. 05/03/17  Yes Allie Bossier, MD  azithromycin (ZITHROMAX) 500 MG tablet Take 1 tablet (500 mg total) by mouth daily for 4 days. 07/28/18 08/01/18 Yes Arrien, Jimmy Picket, MD  cinacalcet (SENSIPAR) 30 MG tablet Take 30 mg by mouth daily with supper.   Yes [provider]  clopidogrel (PLAVIX) 75 MG tablet Take 1 tablet (75 mg total) by mouth daily. 05/07/18  Yes Deboraha Sprang, MD  Fluticasone-Salmeterol (ADVAIR DISKUS) 250-50 MCG/DOSE AEPB Inhale 1 puff into the lungs 2 (two) times daily. 07/28/18 08/27/18 Yes Arrien, Jimmy Picket, MD  guaiFENesin-dextromethorphan [facility] DM) 100-10 MG/5ML syrup Take 5 mLs by mouth every 6 (six) hours as needed for cough. 07/28/18  Yes Arrien, Jimmy Picket, MD  insulin detemir (LEVEMIR) 100 UNIT/ML injection Inject 0.5 mLs (50 Units total) into the skin at bedtime. 04/19/18  Yes Rai, Ripudeep K, MD  ipratropium-albuterol (DUONEB) 0.5-2.5 (3) MG/3ML SOLN Take 3 mLs by nebulization every 6 (six) hours as needed. 07/28/18 08/27/18 Yes Arrien, Jimmy Picket, MD  lidocaine-prilocaine (EMLA) cream Apply 1 application topically once as needed (prior to accessing port).    Yes [provider]  metoprolol tartrate (LOPRESSOR) 25 MG tablet Take 1 tablet (25 mg total) by mouth 2 (two) times daily. 05/07/18  Yes Deboraha Sprang, MD  nitroGLYCERIN (NITROSTAT) 0.4 MG SL tablet Place 1 tablet (0.4 mg total) under the tongue every 5 (five) minutes as needed for chest pain. 04/19/18  Yes Rai,  Ripudeep K, MD  omeprazole (PRILOSEC) 20 MG capsule Take 1 capsule (20 mg total) by mouth daily. 06/23/18  Yes Lauraine Rinne, NP  ranitidine (ZANTAC) 150 MG tablet Take 150 mg by mouth 2 (two) times daily as needed for heartburn.   Yes [provider]  sevelamer carbonate (RENVELA) 800 MG tablet Take 3 tablets (2,400 mg total) by mouth 3 (three) times daily with meals. 07/28/18 08/27/18 Yes Arrien, Jimmy Picket, MD  Triamcinolone Acetonide (TRIAMCINOLONE 0.1 % CREAM : EUCERIN) CREA Apply 1 application topically 3 (three) times daily as needed. Apply to arms, back, and other areas with active rash/itching 04/19/18  Yes Rai, Ripudeep K, MD    Inpatient Medications: Scheduled Meds: . aspirin  324 mg Oral Once   Continuous Infusions:  PRN Meds:  Allergies:    Allergies  Allergen Reactions  . Byetta 10 Mcg Pen [Exenatide] Diarrhea and Nausea And Vomiting  . Codeine Itching  . Coumadin [Warfarin Sodium] Rash    Social History:   Social History   Socioeconomic History  . Marital status: Married    Spouse name: Not on file  . Number of children: Not on file  . Years of education: Not on file  . Highest education level: Not on file  Occupational History  . Occupation: retired  Scientific laboratory technician  . Financial resource strain: Not on file  . Food insecurity:    Worry: Not on file    Inability: Not on file  . Transportation needs:    Medical: Not on file    Non-medical: Not on file  Tobacco Use  . Smoking status: Former Smoker    Packs/day: 2.00    Years: 32.00    Pack years: 64.00    Last attempt to quit: 11/27/1983    Years since quitting: 34.6  . Smokeless tobacco: Never Used  Substance and Sexual Activity  . Alcohol use: Yes    Comment: rare  . Drug use: No  . Sexual activity: Yes    Birth control/protection: None  Lifestyle  . Physical activity:    Days per week: Not on file    Minutes per session: Not on file  . Stress: Not on file  Relationships  . Social  connections:    Talks on phone: Not on file    Gets together: Not on file    Attends religious service: Not on file    Active member of club or organization: Not on file    Attends meetings of clubs or organizations: Not on file    Relationship status: Not on file  . Intimate partner violence:    Fear of current or ex partner: Not on file    Emotionally abused: Not on file    Physically abused: Not on file    Forced sexual activity: Not on file  Other Topics Concern  . Not on file  Social History Narrative  . Not on file    Family History:    Family History  Problem Relation Age of Onset  . Diabetes Father   . Heart disease Father   . Diabetes Sister   . Alzheimer's disease Mother      ROS:  Please see the history of present illness.   All other ROS reviewed and negative.     Physical Exam/Data:   Vitals:   07/30/18 0945 07/30/18 1000 07/30/18 1015 07/30/18 1030  BP: (!) 107/41 (!) 98/48 114/69 (!) 101/51  Pulse: (!) 56 65 (!) 58 (!) 58  Resp: (!) 22 (!) 23 20 (!) 24  Temp:      TempSrc:      SpO2: 100% 100% 99% 100%  Weight:      Height:        Intake/Output Summary (Last 24 hours) at 07/30/2018 1050 Last data filed at 07/30/2018 0940 Gross per 24 hour  Intake 200 ml  Output -  Net 200 ml   Filed Weights   07/30/18 0943  Weight: 107 kg   Body mass index is 32.92 kg/m.  General:  Elderly, moderately obese WM in no acute distress HEENT: normal Lymph: no adenopathy Neck: no JVD Endocrine:  No thryomegaly Vascular: No carotid bruits; FA pulses 2+ bilaterally without bruits  Cardiac:  normal S1, S2; RRR; no murmur  Lungs:  Expiratory rhonchi  bilaterally  Abd: soft, nontender, no hepatomegaly  Ext: trace bilateral pretibial edema.  Musculoskeletal:  No deformities, BUE and BLE strength normal and equal Skin: warm and dry  Neuro:  CNs 2-12 intact, no focal abnormalities noted Psych:  Normal affect   EKG:  The EKG was personally reviewed and  demonstrates:  A-V dual-paced rhythm with some inhibition Telemetry:  Telemetry was personally reviewed and demonstrates:  AV paced   Relevant CV Studies:  [name] 04/18/18 Procedures   CORONARY STENT INTERVENTION  CORONARY ATHERECTOMY  Conclusion     Prox LAD-1 lesion is 60% stenosed.  Prox LAD-2 lesion is 70% stenosed.  Ost 2nd Diag lesion is 70% stenosed.  Ost Cx lesion is 95% stenosed.  Prox Cx to Mid Cx lesion is 30% stenosed.  Prox RCA to Dist RCA lesion is 100% stenosed.  A drug-eluting stent was successfully placed using a STENT SYNERGY DES 3X16.  Post intervention, there is a 0% residual stenosis.  A drug-eluting stent was successfully placed using a STENT SYNERGY DES 3X38.  Post intervention, there is a 0% residual stenosis.  Post intervention, there is a 0% residual stenosis.   1. Severe stenosis proximal to mid LAD 2. Successful PTCA/orbital atherectomy/DES x 1 proximal to mid LAD 3. Severe stenosis ostial Circumflex artery 4. Successful PTCA/orbital atherectomy/DES x 1 ostium Circumflex   Coronary Diagrams   Diagnostic Diagram       Post-Intervention Diagram        2D Echo 04/17/18 Study Conclusions  - Left ventricle: The cavity size was normal. There was moderate   concentric hypertrophy. Systolic function was mildly reduced. The   estimated ejection fraction was in the range of 45% to 50%. There   is akinesis of the apicalanterior, lateral, inferior, and apical   myocardium. The study is not technically sufficient to allow   evaluation of LV diastolic function. - Aortic valve: Bioprosthetic valve s/p TAVR with normal valve   function. There was mild-moderate perivalvular regurgitation.   Mean gradient (S): 8 mm Hg. Valve area (VTI): 1.25 cm^2. Valve   area (Vmax): 1.08 cm^2. Valve area (Vmean): 1.12 cm^2.   Regurgitation pressure half-time: 367 ms. - Mitral valve: Severely calcified annulus. There was mild   regurgitation. - Left atrium:  The atrium was severely dilated. - Pulmonary arteries: Systolic pressure could not be accurately   estimated. - Inferior vena cava: The vessel was dilated. The respirophasic   diameter changes were in the normal range (>= 50%). - Pericardium, extracardiac: A trivial, free-flowing pericardial   effusion was identified circumferential to the heart.    Laboratory Data:  Chemistry Recent Labs  Lab 07/26/18 2347 07/27/18 0206 07/27/18 0708 07/28/18 0401  NA 132*  --  132* 131*  K 3.9  --  3.8 3.5  CL 93*  --  97* 94*  CO2 25  --  21* 22  GLUCOSE 237*  --  321* 330*  BUN 43*  --  49* 62*  CREATININE 4.89* 5.18* 5.40* 5.97*  CALCIUM 8.9  --  8.4* 8.2*  GFRNONAA 10* 10* 9* 8*  GFRAA 12* 11* 10* 9*  ANIONGAP 14  --  14 15    No results for input(s): PROT, ALBUMIN, AST, ALT, ALKPHOS, BILITOT in the last 168 hours. Hematology Recent Labs  Lab 07/27/18 0708 07/28/18 0846 07/30/18 0946  WBC 7.2 9.6 9.8  RBC 3.18* 2.90* 3.02*  HGB 9.5* 8.5* 8.9*  HCT 29.7* 26.3* 27.9*  MCV 93.4 90.7 92.4  MCH  29.9 29.3 29.5  MCHC 32.0 32.3 31.9  RDW 14.6 14.6 15.3  PLT 187 226 233   Cardiac Enzymes Recent Labs  Lab 07/27/18 0206 07/27/18 0708  TROPONINI 0.32* 0.23*    Recent Labs  Lab 07/26/18 2354 07/30/18 1009  TROPIPOC 0.21* 1.28*    BNP Recent Labs  Lab 07/27/18 0129  BNP 2,421.8*    DDimer No results for input(s): DDIMER in the last 168 hours.  Radiology/Studies:  Dg Chest 2 View  Result Date: 07/27/2018 CLINICAL DATA:  Shortness of breath and LEFT chest pain beginning at 6 p.m. History of CHF, COPD, TAVR. EXAM: CHEST - 2 VIEW COMPARISON:  Chest radiograph June 23, 2018 FINDINGS: Moderate cardiomegaly similar to prior examination. Status post aortic valve replacement. Calcified aortic arch. Mild pulmonary vascular congestion and increasing interstitial prominence. Patchy retrocardiac consolidation. Small pleural effusions. Flattened hemidiaphragms. RIGHT cardiac  pacemaker in situ. Calcifications in LEFT neck are likely vascular. Surgical clips in the included right abdomen compatible with cholecystectomy. IMPRESSION: 1. Stable cardiomegaly. Increasing interstitial prominence most compatible with pulmonary edema. Retrocardiac consolidation. Small pleural effusions. 2. Underlying suspected COPD. 3.  Aortic Atherosclerosis (ICD10-I70.0). Electronically Signed   By: Elon Alas M.D.   On: 07/27/2018 00:42    Assessment and Plan:   Jeffrey [name] is a 77 y.o. male with a hx of complete heart block s/p PPM followed by Dr. Caryl Comes, ESRD on HD, DMT2, PVD, multivessel CAD turned down for CABG, s/p recent NSTEMI w/ PCI to LAD and LCx 03/2018, h/o aortic stenosis s/p TAVR in 2016, LV dysfunction (EF45-50%), paroxysmal Afib no longer on coumadin due to side effects (rash) and COPD w/ chronic hypoxic respiratory failure, who is being seen today for evaluation of chest pain and elevated troponin, at the request of Dr. Lita Mains, Emergency Department.  1. NSTEMI: recent symptoms c/w unstable angina. He has had 2 severe episodes of substernal chest tightness with radiation to the anterior neck, jaw/gums and back in the past 24 hrs, similar to previous angina and relieved with SL NTG. He has known multivessel disease and recent NSTEMI in May, however felt too high risk for CABG. He underwent PCI +DES to the mid LAD and ostial LCX. There is also CTO of the mid RCA, however he had L>>R collateralization. Also h/o AS s/p TAVR 07/2015. His POC Troponin in the ED today is elevated at 1.28 (much higher from recent NSTEMI 03/2018, when troponin peaked at 0.53). Also up from recent hospitalization for COPDE. POC troponin 4 days ago was 0.21. EKG shows AV paced rhythm. He is currently CP free after receiving SL NTG and ASA at the dialysis center. Given his history and degree of troponin elevation, would recommend admission for further w/u and likely LHC to reassess status of recently placed LAD  and Cx stents. Cycle troponin's x 3 to further assess trend. IV heparin per pharmacy. MD to follow with further recommendations.   2. ESRD: on HD [facility]. Had ~2h session today but cut short due to acute CP. Managment per nephrology.   3. Respiratory: COPD is outlined in [place] and pt had recent hospitalization for ACOPDE/ hypoxic respiratory failure, but pt adamantly denies diagnosis. He does require supplemental O2 24/7. Management per primary.   4. T2DM: management per primary.   5. H/o Severe Aortic Stenosis: s/p TAVR 07/2015. Echo 03/2018 showed normal valve   function. There was mild-moderate perivalvular regurgitation  6. LV Dysfunction: echo 03/2018 showed mildly reduced LVEF at 45-50%. Volume is controlled though  HD. He has trace bilateral LEE on exam. Continue scheduled HD for volume control.  7. H/o Atrial Fibrillation: no longer on coumadin due to side effects (rash). Not on NOACs due to CKD.   8. Anemia: Hgb is 8.9. Ranges over the past year have been variable from 8.5-12.4. ? Anemia of chronic disease from CKD. Management per primary.    For questions or updates, please contact [place] Please consult [email or web address] for contact info under Cardiology/STEMI.   Signed, Lyda Jester, PA-C  07/30/2018 10:50 AM   As above, patient seen and examined.  Briefly he is a 77 year old male with a past medical history of coronary artery disease, complete heart block status post pacemaker, end-stage renal disease dialysis dependent, diabetes mellitus, history of aortic stenosis status post TAVR, paroxysmal atrial fibrillation not on anticoagulation with non-ST elevation myocardial infarction.  Patient is status post PCI May 2019.  He has done well until the past week when he has developed recurrent chest heaviness radiating to his jaws associated with dyspnea and nausea but no diaphoresis.  His pain improves with O2.  He had some during dialysis today and presented to the emergency room.   Presently pain-free.  Troponin elevated.  Electrocardiogram shows AV pacing.  1 non-ST elevation myocardial infarction-patient is presently pain-free but troponin abnormal.  Plan to continue aspirin, Plavix, metoprolol and statin.  Add IV heparin.  Patient will require cardiac catheterization.  The risks and benefits including myocardial infarction, CVA and death discussed and he agrees to proceed.  2 end-stage renal disease-management per nephrology.  3 prior pacemaker  4 prior TAVR  5 paroxysmal atrial fibrillation-patient is AV paced at present.  Continue metoprolol. CHADSvasc 8.  Patient is not on Coumadin because of history of rash.  Will need further discussion with Dr. Caryl Comes after discharge. Not on Doacs due to ESRD.  Kirk Ruths, MD

## 2018-07-30 NOTE — Progress Notes (Signed)
[place] for heparin Indication: chest pain/ACS  Allergies  Allergen Reactions  . Byetta 10 Mcg Pen [Exenatide] Diarrhea and Nausea And Vomiting  . Codeine Itching  . Coumadin [Warfarin Sodium] Rash    Patient Measurements: Height: 5\' 11"  (180.3 cm) Weight: 236 lb (107 kg) IBW/kg (Calculated) : 75.3 Heparin Dosing Weight: 98kg  Vital Signs: Temp: 99.1 F (37.3 C) (09/04 2004) Temp Source: Oral (09/04 2004) BP: 146/55 (09/04 2004) Pulse Rate: 72 (09/04 2108)  Labs: Recent Labs    07/28/18 0401 07/28/18 0846 07/30/18 0946 07/30/18 0955 07/30/18 1722 07/30/18 2145  HGB  --  8.5* 8.9*  --   --   --   HCT  --  26.3* 27.9*  --   --   --   PLT  --  226 233  --   --   --   HEPARINUNFRC  --   --   --   --   --  0.11*  CREATININE 5.97*  --   --  3.35*  --   --   TROPONINI  --   --   --   --  2.75* 3.37*    Estimated Creatinine Clearance: 22.3 mL/min (A) (by C-G formula based on SCr of 3.35 mg/dL (H)).   Medical History: Past Medical History:  Diagnosis Date  . Anginal pain ([place])   . Aortic stenosis    a. severe by echo 09/2014  . Atrial fibrillation ([place])    a. not well documented, not on anticoagulation  . CHF (congestive heart failure) ([place])    04/28/17 echo-EF 40%, mod LVH, diastolic dysfunction  . Claustrophobia   . Complete heart block ([place])   . COPD (chronic obstructive pulmonary disease) ([place])   . Coronary artery disease    a. chronically occluded RCA per cath 09/2014 with collaterals B. cath 05/01/17 chr occ RCA w/collaterals, 60-70% mid LAD,   . CVA (cerebral vascular accident) ([place]) 10/2014   denies residual on 07/11/2015  . ESRD (end stage renal disease) on dialysis [facility])    a. on dialysis; [facility] [place] [facility]; MWF, LUE fistula (07/11/2015)  . History of blood transfusion    "related to gallbladder OR"  . History of stomach ulcers   . Hyperlipidemia   . Hypertension   . Iron deficiency anemia   . Myocardial infarction  ([place]) 10/2014  . Peripheral vascular disease ([place])   . Pneumonia   . Presence of permanent cardiac pacemaker   . 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 Type II diabetes mellitus (HCC)     Medications:  Infusions:  . sodium chloride    . sodium chloride    . [START ON 07/31/2018] sodium chloride    . heparin 1,600 Units/hr (07/30/18 1402)    Assessment: 58 yom presented to the ED with CP. Troponin elevated and now starting IV heparin. Baseline Hgb low at 8.9 and platelets are WNL. Noted history of afib but pt is not on anticoagulation PTA.  Initial heparin level is 0.11 units/ml  Goal of Therapy:  Heparin level 0.3-0.7 units/ml Monitor platelets by anticoagulation protocol: Yes   Plan:  Heparin bolus 2000 units IV x 1 Heparin gtt 1900 units/hr Check an 8 hr heparin level Daily heparin level and CBC   Flo Berroa Poteet 07/30/2018,11:32 PM

## 2018-07-30 NOTE — H&P (Signed)
History and Physical    Jeffrey [name]:[id number] DOB: 10/27/1941 DOA: 07/30/2018  PCP: Thurman Coyer, MD Consultants:  Caryl Comes - EP; [facility] - nephrology; [facility] - cardiology; [facility] - pulmonology Patient coming from:  Home - lives with wife and son; Donald Prose: Wife, [phone]  Chief Complaint: chest pain  HPI: Jeffrey [name] is a 77 y.o. male with medical history significant of DM; pacemaker; PVD; CAD; HTN; HLD; ESRD on MWF HD; CVA; COPD on home O2; chronic combined CHF; severe aortic stenosis s/p TAVR; and afib not on [facility] presenting with chest pain.  He was in HD for about 2 hours and developed a choking sensation, couldn't breathe, and had chest pain into jaw and neck.   He took an NTG x 2.  The nurse "said I was grayer than heck and she was gonna call [phone]."  The NTG eased the pain.  He took ASA and that also eased the pain.  He is currently pain free.  He was told Monday at the time of d/c that he will need O2 24/7.  He was having chest pain when he was admitted recently.  He had improved SOB after d/c.  Last night, about 8pm he was SOB and having CP but wouldn't come to the hospital - so today was really a continuation of last night but "it kind of multiplied itself today."   ED Course:  During HD today he developed CP radiating into the jaw with SOB.  He was discharged 2 days ago with COPD exacerbation.  CP resolved with 2 NTG.  Still having SOB, on home O2.  Troponin is >1.   Cardiology has consulted, thinks this is NSTEMI and is planning LHC.  Review of Systems: As per HPI; otherwise review of systems reviewed and negative.   Ambulatory Status:  Ambulates with a cane or walker  Past Medical History:  Diagnosis Date  . Anginal pain ([place])   . Aortic stenosis    a. severe by echo 09/2014  . Atrial fibrillation ([place])    a. not well documented, not on anticoagulation  . CHF (congestive heart failure) ([place])    04/28/17 echo-EF 40%, mod LVH, diastolic dysfunction  . Claustrophobia   .  Complete heart block ([place])   . COPD (chronic obstructive pulmonary disease) ([place])   . Coronary artery disease    a. chronically occluded RCA per cath 09/2014 with collaterals B. cath 05/01/17 chr occ RCA w/collaterals, 60-70% mid LAD,   . CVA (cerebral vascular accident) ([place]) 10/2014   denies residual on 07/11/2015  . ESRD (end stage renal disease) on dialysis [facility])    a. on dialysis; [facility]; MWF, LUE fistula (07/11/2015)  . History of blood transfusion    "related to gallbladder OR"  . History of stomach ulcers   . Hyperlipidemia   . Hypertension   . Iron deficiency anemia   . Myocardial infarction ([place]) 10/2014  . Peripheral vascular disease ([place])   . Pneumonia   . Presence of permanent cardiac pacemaker   . 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 Type II diabetes mellitus ([place])     Past Surgical History:  Procedure Laterality Date  . AV FISTULA PLACEMENT Left 10/19/2014   Procedure: BRACHIOCEPHALIC ARTERIOVENOUS (AV) FISTULA CREATION ;  Surgeon: Conrad Huron, MD;  Location: [place];  Service: Vascular;  Laterality: Left;  . CARDIAC CATHETERIZATION    .  CARDIAC CATHETERIZATION N/A 07/22/2015   Procedure: Right/Left Heart Cath and Coronary Angiography;  Surgeon: [name], MD;  Location: [place] CV LAB;  Service: Cardiovascular;  Laterality: N/A;  . CATARACT EXTRACTION W/ INTRAOCULAR LENS  IMPLANT, BILATERAL Bilateral 1990's  . CHOLECYSTECTOMY OPEN  1980's  . COLONOSCOPY W/ BIOPSIES AND POLYPECTOMY    . CORONARY ANGIOPLASTY    . CORONARY ATHERECTOMY N/A 04/18/2018   Procedure: CORONARY ATHERECTOMY;  Surgeon: [name], MD;  Location: [place] CV LAB;  Service: Cardiovascular;  Laterality: N/A;  . CORONARY STENT INTERVENTION N/A 04/18/2018   Procedure: CORONARY STENT INTERVENTION;  Surgeon: [name], MD;  Location:  [place] CV LAB;  Service: Cardiovascular;  Laterality: N/A;  . EP IMPLANTABLE DEVICE N/A 07/11/2015   Procedure: Pacemaker Implant;  Surgeon: [name], MD;  Location: [place] CV LAB;  Service: Cardiovascular;  Laterality: N/A;  . ESOPHAGOGASTRODUODENOSCOPY  08/01/2012   Procedure: ESOPHAGOGASTRODUODENOSCOPY (EGD);  Surgeon: [name], MD;  Location: [facility];  Service: Endoscopy;  Laterality: N/A;  . INSERT / REPLACE / REMOVE PACEMAKER  07/11/2015  . INSERTION OF DIALYSIS CATHETER Right 02/02/2015   Procedure: INSERTION OF DIALYSIS CATHETER  RIGHT INTERNAL JUGULAR;  Surgeon: Mal [name], MD;  Location: [place];  Service: Vascular;  Laterality: Right;  . LEFT AND RIGHT HEART CATHETERIZATION WITH CORONARY ANGIOGRAM N/A 09/30/2014   Procedure: LEFT AND RIGHT HEART CATHETERIZATION WITH CORONARY ANGIOGRAM;  Surgeon: [name], MD;  Location: [facility] CATH LAB;  Service: Cardiovascular;  Laterality: N/A;  . LEFT HEART CATH AND CORONARY ANGIOGRAPHY N/A 04/14/2018   Procedure: LEFT HEART CATH AND CORONARY ANGIOGRAPHY;  Surgeon: [name], MD;  Location: [place] CV LAB;  Service: Cardiovascular;  Laterality: N/A;  . TEE WITHOUT CARDIOVERSION N/A 08/02/2015   Procedure: TRANSESOPHAGEAL ECHOCARDIOGRAM (TEE);  Surgeon: [name], MD;  Location: [place];  Service: Open Heart Surgery;  Laterality: N/A;  . TONSILLECTOMY    . TRANSCATHETER AORTIC VALVE REPLACEMENT, TRANSFEMORAL Left 08/02/2015   Procedure: TRANSCATHETER AORTIC VALVE REPLACEMENT, TRANSFEMORAL;  Surgeon: [name], MD;  Location: [place];  Service: Open Heart Surgery;  Laterality: Left;    Social History   Socioeconomic History  . Marital status: Married    Spouse name: Not on file  . Number of children: Not on file  . Years of education: Not on file  . Highest education level: Not on file  Occupational History  . Occupation: retired  Scientific laboratory technician  . Financial resource strain: Not on file  . Food  insecurity:    Worry: Not on file    Inability: Not on file  . Transportation needs:    Medical: Not on file    Non-medical: Not on file  Tobacco Use  . Smoking status: Former Smoker    Packs/day: 2.00    Years: 32.00    Pack years: 64.00    Last attempt to quit: 11/27/1983    Years since quitting: 34.6  . Smokeless tobacco: Never Used  Substance and Sexual Activity  . Alcohol use: Yes    Comment: rare  . Drug use: No  . Sexual activity: Yes    Birth control/protection: None  Lifestyle  . Physical activity:    Days per week: Not on file    Minutes per session: Not on file  . Stress: Not on file  Relationships  . Social connections:    Talks on phone: Not on file    Gets together:  Not on file    Attends religious service: Not on file    Active member of club or organization: Not on file    Attends meetings of clubs or organizations: Not on file    Relationship status: Not on file  . Intimate partner violence:    Fear of current or ex partner: Not on file    Emotionally abused: Not on file    Physically abused: Not on file    Forced sexual activity: Not on file  Other Topics Concern  . Not on file  Social History Narrative  . Not on file    Allergies  Allergen Reactions  . Byetta 10 Mcg Pen [Exenatide] Diarrhea and Nausea And Vomiting  . Codeine Itching  . Coumadin [Warfarin Sodium] Rash    Family History  Problem Relation Age of Onset  . Diabetes Father   . Heart disease Father   . Diabetes Sister   . Alzheimer's disease Mother     Prior to Admission medications   Medication Sig Start Date End Date Taking? Authorizing Provider  albuterol (PROVENTIL HFA;VENTOLIN HFA) 108 (90 Base) MCG/ACT inhaler Inhale 2 puffs into the lungs every 6 (six) hours as needed for wheezing or shortness of breath. 04/19/18  Yes Rai, Ripudeep K, MD  aspirin EC 81 MG tablet Take 1 tablet (81 mg total) by mouth daily. 05/15/17  Yes Eileen Stanford, PA-C  atorvastatin (LIPITOR) 40  MG tablet Take 2 tablets (80 mg total) by mouth daily. 05/03/17  Yes Allie Bossier, MD  azithromycin (ZITHROMAX) 500 MG tablet Take 1 tablet (500 mg total) by mouth daily for 4 days. 07/28/18 08/01/18 Yes Arrien, Jimmy Picket, MD  cinacalcet (SENSIPAR) 30 MG tablet Take 30 mg by mouth daily with supper.   Yes [provider]  clopidogrel (PLAVIX) 75 MG tablet Take 1 tablet (75 mg total) by mouth daily. 05/07/18  Yes Deboraha Sprang, MD  Fluticasone-Salmeterol (ADVAIR DISKUS) 250-50 MCG/DOSE AEPB Inhale 1 puff into the lungs 2 (two) times daily. 07/28/18 08/27/18 Yes Arrien, Jimmy Picket, MD  guaiFENesin-dextromethorphan [facility]) 100-10 MG/5ML syrup Take 5 mLs by mouth every 6 (six) hours as needed for cough. 07/28/18  Yes Arrien, Jimmy Picket, MD  insulin detemir (LEVEMIR) 100 UNIT/ML injection Inject 0.5 mLs (50 Units total) into the skin at bedtime. 04/19/18  Yes Rai, Ripudeep K, MD  ipratropium-albuterol (DUONEB) 0.5-2.5 (3) MG/3ML SOLN Take 3 mLs by nebulization every 6 (six) hours as needed. 07/28/18 08/27/18 Yes Arrien, Jimmy Picket, MD  lidocaine-prilocaine (EMLA) cream Apply 1 application topically once as needed (prior to accessing port).    Yes [provider]  metoprolol tartrate (LOPRESSOR) 25 MG tablet Take 1 tablet (25 mg total) by mouth 2 (two) times daily. 05/07/18  Yes Deboraha Sprang, MD  nitroGLYCERIN (NITROSTAT) 0.4 MG SL tablet Place 1 tablet (0.4 mg total) under the tongue every 5 (five) minutes as needed for chest pain. 04/19/18  Yes Rai, Ripudeep K, MD  omeprazole (PRILOSEC) 20 MG capsule Take 1 capsule (20 mg total) by mouth daily. 06/23/18  Yes Lauraine Rinne, NP  ranitidine (ZANTAC) 150 MG tablet Take 150 mg by mouth 2 (two) times daily as needed for heartburn.   Yes [provider]  sevelamer carbonate (RENVELA) 800 MG tablet Take 3 tablets (2,400 mg total) by mouth 3 (three) times daily with meals. 07/28/18 08/27/18 Yes Arrien, Jimmy Picket, MD    Triamcinolone Acetonide (TRIAMCINOLONE 0.1 % CREAM : EUCERIN) CREA Apply  1 application topically 3 (three) times daily as needed. Apply to arms, back, and other areas with active rash/itching 04/19/18  Yes Rai, Vernelle Emerald, MD    Physical Exam: Vitals:   07/30/18 1121 07/30/18 1131 07/30/18 1204 07/30/18 1230  BP: 94/75 (!) 101/53 (!) 117/48 (!) 116/42  Pulse: (!) 57 (!) 57 (!) 58 (!) 53  Resp: 17 19 (!) 21 (!) 23  Temp:      TempSrc:      SpO2: 97% 100% 100% 99%  Weight:      Height:         General:  Appears calm and comfortable and is NAD Eyes:  PERRL, EOMI, normal lids, iris ENT:  grossly normal hearing, lips & tongue, mmm; edentulous Neck:  no LAD, masses or thyromegaly; no carotid bruits Cardiovascular:  RRR, no m/r/g. No LE edema.  Respiratory:   CTA bilaterally with scattered wheezes and increased expiratory time.  Normal respiratory effort.  Occasional productive cough. Abdomen:  soft, NT, ND, NABS Back:   normal alignment, no CVAT Skin:  Scattered excoriations on upper back Musculoskeletal:  grossly normal tone BUE/BLE, good ROM, no bony abnormality Lower extremity:  No LE edema.   2+ distal pulses. Psychiatric:  grossly normal mood and affect, speech fluent and appropriate, AOx3 Neurologic:  CN 2-12 grossly intact, moves all extremities in coordinated fashion, sensation intact    Radiological Exams on Admission: Dg Chest Port 1 View  Result Date: 07/30/2018 CLINICAL DATA:  Increasing shortness of breath and chest pain today. EXAM: PORTABLE CHEST 1 VIEW COMPARISON:  Radiographs dated 07/27/2018 and 06/23/2018 FINDINGS: Stable cardiomegaly.  Pacemaker in place.  Aortic atherosclerosis. Interstitial markings are slightly accentuated due to the shallow inspiration. No acute infiltrates or effusions. No acute bone abnormality. IMPRESSION: No acute abnormality. Stable cardiomegaly. Accentuated markings due to a shallow inspiration. Electronically Signed   By: Lorriane Shire  M.D.   On: 07/30/2018 11:41    EKG: Independently reviewed.  AV paced with rate 72; no obvious ischemia   Labs on Admission: I have personally reviewed the available labs and imaging studies at the time of the admission.  Pertinent labs:   Glucose 140 BUN 27/Creatinine 3.35/GFR 19 BNP 3146; 2421.8 on 9/1 Troponin 1.28; 0.23 on 9/1 WBC 9.8 Hgb 8.9 - stable  Assessment/Plan Principal Problem:   NSTEMI (non-ST elevated myocardial infarction) ([place]) Active Problems:   Type 2 diabetes mellitus with chronic kidney disease on chronic dialysis, with long-term current use of insulin (HCC)   Essential hypertension   ESRD (end stage renal disease) on dialysis (HCC)   Chronic combined systolic and diastolic CHF (congestive heart failure) ([place])   Hyperlipidemia   COPD with acute exacerbation (HCC)   Atrial fibrillation ([place]) [I48.91]   NSTEMI -Unstable angina -Patient with substernal chest pain that came on acutely during HD but improved with NTG. -In review, however, he has been having escalating chest pain for several days including a more severe episode last night. -CXR unremarkable.   -Troponin positive and increased since last admission. -AV pacing apparent on EKG, no apparent STEMI. -TIMI risk score is 6; which predicts a 14 days risk of death, recurrent MI, or urgent revascularization of 40.9%.  -Will plan to admit to SDU at [facility] on telemetry to further evaluate for ACS.  -Cardiology has consulted and is planning to perform LHC tomorrow AM. -cycle troponin q6h x 3 and repeat EKG in AM -ASA and Plavix daily continued -morphine given -Continue high-dose Lipitor -Start Heparin  drip  HTN -Continue Lopressor -Since he is on HD, an ACE is not contraindicated and may be a good addition for him.  HLD -Continue Lipitor -Lipids were checked in 5/19 and were well controlled (LDL 22)  DM -Will check A1c -Continue Levemir -Cover with moderate-scale SSI  COPD with recent  exacerbation -Appears to be improving -Continue home O2 -Continue Duonebs, complete Azithromycin, continue Advair  ESRD -Patient on chronic TTS HD -Nephrology prn order set utilized -He does not appear to be volume overloaded or otherwise in need of acute HD -Dr. Jonnie Finner was notified of admission  Chronic combined heart failure -Appears to be compensated at this time -Will follow  Afib -Rate controlled with AV pacer -His CHA2DS2-VASc score is 8 but he cannot take NOAC due to ESRD and developed rash with Coumadin; will defer to cardiology      DVT prophylaxis: Heparin drip Code Status:  Full - confirmed with patient/family Family Communication: Wife and son were present throughout evaluation  Disposition Plan:  Home once clinically improved Consults called: Cardiology  Admission status: Admit - It is my clinical opinion that admission to [place] is reasonable and necessary because of the expectation that this patient will require hospital care that crosses at least 2 midnights to treat this condition based on the medical complexity of the problems presented.  Given the aforementioned information, the predictability of an adverse outcome is felt to be significant.    Karmen Bongo MD [facility]  If note is complete, please contact covering daytime or nighttime physician. [email or web address] Password [email or web address]  07/30/2018, 1:18 PM

## 2018-07-30 NOTE — Progress Notes (Signed)
Brett from [facility] contacted to interrogate pacemaker.

## 2018-07-30 NOTE — ED Provider Notes (Addendum)
[facility] EMERGENCY DEPARTMENT Provider Note   CSN: [id number] Arrival date & time: 07/30/18  0932     History   Chief Complaint Chief Complaint  Patient presents with  . Chest Pain    HPI Jeffrey [name] is a 77 y.o. male.  HPI She was recently admitted for COPD exacerbation.  States he recently completed course of steroids and is continue to take p.o. antibiotics.  Had fever last night with delusions and hallucinations per wife.  Patient developed central chest pain radiating to his jaw this morning around 8:00 while undergoing dialysis.  States he took 2 nitroglycerin and his chest pain now is completely resolved.  Had associated with shortness of breath and has persistent cough.  No new lower extremity swelling or pain. Patient states he has had nausea and several episodes of loose stools yesterday.  Unknown whether there was any blood in the stool. Past Medical History:  Diagnosis Date  . Anginal pain ([place])   . Aortic stenosis    a. severe by echo 09/2014  . Atrial fibrillation ([place])    a. not well documented, not on anticoagulation  . CHF (congestive heart failure) ([place])    04/28/17 echo-EF 40%, mod LVH, diastolic dysfunction  . Claustrophobia   . Complete heart block ([place])   . COPD (chronic obstructive pulmonary disease) ([place])   . Coronary artery disease    a. chronically occluded RCA per cath 09/2014 with collaterals B. cath 05/01/17 chr occ RCA w/collaterals, 60-70% mid LAD,   . CVA (cerebral vascular accident) ([place]) 10/2014   denies residual on 07/11/2015  . ESRD (end stage renal disease) on dialysis [facility])    a. on dialysis; [facility]; MWF, LUE fistula (07/11/2015)  . History of blood transfusion    "related to gallbladder OR"  . History of stomach ulcers   . Hyperlipidemia   . Hypertension   . Iron deficiency anemia   . Myocardial infarction ([place]) 10/2014  . Peripheral vascular disease ([place])   . Pneumonia   . Presence of permanent cardiac  pacemaker   . 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 Type II diabetes mellitus [facility])     Patient Active Problem List   Diagnosis Date Noted  . Respiratory distress 07/27/2018  . GERD (gastroesophageal reflux disease) 06/23/2018  . Cough 06/23/2018  . Chest pain 04/15/2018  . NSTEMI (non-ST elevated myocardial infarction) ([place]) 04/14/2018  . Rash 04/14/2018  . COPD GOLD 0 09/26/2017  . Atrial fibrillation ([place]) [I48.91] 05/21/2017  . Encounter for therapeutic drug monitoring 05/21/2017  . Obesity (BMI 30-39.9) 04/17/2017  . Hyponatremia 03/10/2017  . COPD with acute exacerbation ([place]) 03/10/2017  . Non-healing open wound of left groin 09/05/2015  . Automatic implantable cardioverter-defibrillator in situ 08/08/2015  . 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 Hypervolemia   . Chronic combined systolic and diastolic CHF (congestive heart failure) ([place]) 07/19/2015  . Fluid overload 07/18/2015  . Acute on chronic respiratory failure with hypoxia ([place]) 07/18/2015  . 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/2015  . Atherosclerosis of native coronary artery of native heart with angina pectoris ([place])   . Protein-calorie malnutrition, severe ([place]) 01/14/2015  . ESRD (end stage renal disease) on dialysis ([place]) 01/12/2015  . Mobitz type 1 second degree AV block   . Elevated troponin 09/30/2014  . History of stroke 09/29/2014  .  Type 2 diabetes mellitus with chronic kidney disease on chronic dialysis, with long-term current use of insulin ([place]) 09/29/2014  . CAD (coronary artery disease) 09/29/2014  . Essential hypertension   . History of tobacco use 11/21/2012  . Hyperlipidemia 01/19/2010  . Arthritis, degenerative 01/19/2010  . Allergic rhinitis 12/05/2009  . Idiopathic peripheral neuropathy 01/05/2009  . ED (erectile  dysfunction) of organic origin 12/19/2007  . Absolute anemia 11/13/2007    Past Surgical History:  Procedure Laterality Date  . AV FISTULA PLACEMENT Left 10/19/2014   Procedure: BRACHIOCEPHALIC ARTERIOVENOUS (AV) FISTULA CREATION ;  Surgeon: Conrad Franklin, MD;  Location: [facility];  Service: Vascular;  Laterality: Left;  . CARDIAC CATHETERIZATION    . CARDIAC CATHETERIZATION N/A 07/22/2015   Procedure: Right/Left Heart Cath and Coronary Angiography;  Surgeon: [name], MD;  Location: [facility] CV LAB;  Service: Cardiovascular;  Laterality: N/A;  . CATARACT EXTRACTION W/ INTRAOCULAR LENS  IMPLANT, BILATERAL Bilateral 1990's  . CHOLECYSTECTOMY OPEN  1980's  . COLONOSCOPY W/ BIOPSIES AND POLYPECTOMY    . CORONARY ANGIOPLASTY    . CORONARY ATHERECTOMY N/A 04/18/2018   Procedure: CORONARY ATHERECTOMY;  Surgeon: [name], MD;  Location: [facility] CV LAB;  Service: Cardiovascular;  Laterality: N/A;  . CORONARY STENT INTERVENTION N/A 04/18/2018   Procedure: CORONARY STENT INTERVENTION;  Surgeon: [name], MD;  Location: [facility] CV LAB;  Service: Cardiovascular;  Laterality: N/A;  . EP IMPLANTABLE DEVICE N/A 07/11/2015   Procedure: Pacemaker Implant;  Surgeon: [name], MD;  Location: [facility] CV LAB;  Service: Cardiovascular;  Laterality: N/A;  . ESOPHAGOGASTRODUODENOSCOPY  08/01/2012   Procedure: ESOPHAGOGASTRODUODENOSCOPY (EGD);  Surgeon: [name], MD;  Location: [facility];  Service: Endoscopy;  Laterality: N/A;  . INSERT / REPLACE / REMOVE PACEMAKER  07/11/2015  . INSERTION OF DIALYSIS CATHETER Right 02/02/2015   Procedure: INSERTION OF DIALYSIS CATHETER  RIGHT INTERNAL JUGULAR;  Surgeon: Mal [name], MD;  Location: [facility];  Service: Vascular;  Laterality: Right;  . LEFT AND RIGHT HEART CATHETERIZATION WITH CORONARY ANGIOGRAM N/A 09/30/2014   Procedure: LEFT AND RIGHT HEART CATHETERIZATION WITH CORONARY ANGIOGRAM;  Surgeon: [name], MD;  Location: [facility] CATH LAB;  Service: Cardiovascular;  Laterality: N/A;  . LEFT HEART CATH AND CORONARY ANGIOGRAPHY N/A 04/14/2018   Procedure: LEFT HEART CATH AND CORONARY ANGIOGRAPHY;  Surgeon: [name], MD;  Location: [facility] CV LAB;  Service: Cardiovascular;  Laterality: N/A;  . TEE WITHOUT CARDIOVERSION N/A 08/02/2015   Procedure: TRANSESOPHAGEAL ECHOCARDIOGRAM (TEE);  Surgeon: [name], MD;  Location: [facility];  Service: Open Heart Surgery;  Laterality: N/A;  . TONSILLECTOMY    . TRANSCATHETER AORTIC VALVE REPLACEMENT, TRANSFEMORAL Left 08/02/2015   Procedure: TRANSCATHETER AORTIC VALVE REPLACEMENT, TRANSFEMORAL;  Surgeon: [name], MD;  Location: [facility];  Service: Open Heart Surgery;  Laterality: Left;        Home Medications    Prior to Admission medications   Medication Sig Start Date End Date Taking? Authorizing Provider  albuterol (PROVENTIL HFA;VENTOLIN HFA) 108 (90 Base) MCG/ACT inhaler Inhale 2 puffs into the lungs every 6 (six) hours as needed for wheezing or shortness of breath. 04/19/18  Yes Rai, Ripudeep K, MD  aspirin EC 81 MG tablet Take 1 tablet (81 mg total) by mouth daily. 05/15/17  Yes Eileen Stanford, PA-C  atorvastatin (LIPITOR) 40 MG tablet Take 2 tablets (80 mg total) by mouth daily. 05/03/17  Yes Allie Bossier,  MD  azithromycin (ZITHROMAX) 500 MG tablet Take 1 tablet (500 mg total) by mouth daily for 4 days. 07/28/18 08/01/18 Yes Arrien, Jimmy Picket, MD  cinacalcet (SENSIPAR) 30 MG tablet Take 30 mg by mouth daily with supper.   Yes [provider]  clopidogrel (PLAVIX) 75 MG tablet Take 1 tablet (75 mg total) by mouth daily. 05/07/18  Yes Deboraha Sprang, MD  Fluticasone-Salmeterol (ADVAIR DISKUS) 250-50 MCG/DOSE AEPB Inhale 1 puff into the lungs 2 (two) times daily. 07/28/18 08/27/18 Yes Arrien, Jimmy Picket, MD  guaiFENesin-dextromethorphan [facility] DM) 100-10 MG/5ML syrup Take 5 mLs by mouth every 6 (six) hours as needed for  cough. 07/28/18  Yes Arrien, Jimmy Picket, MD  insulin detemir (LEVEMIR) 100 UNIT/ML injection Inject 0.5 mLs (50 Units total) into the skin at bedtime. 04/19/18  Yes Rai, Ripudeep K, MD  ipratropium-albuterol (DUONEB) 0.5-2.5 (3) MG/3ML SOLN Take 3 mLs by nebulization every 6 (six) hours as needed. 07/28/18 08/27/18 Yes Arrien, Jimmy Picket, MD  lidocaine-prilocaine (EMLA) cream Apply 1 application topically once as needed (prior to accessing port).    Yes [provider]  metoprolol tartrate (LOPRESSOR) 25 MG tablet Take 1 tablet (25 mg total) by mouth 2 (two) times daily. 05/07/18  Yes Deboraha Sprang, MD  nitroGLYCERIN (NITROSTAT) 0.4 MG SL tablet Place 1 tablet (0.4 mg total) under the tongue every 5 (five) minutes as needed for chest pain. 04/19/18  Yes Rai, Ripudeep K, MD  omeprazole (PRILOSEC) 20 MG capsule Take 1 capsule (20 mg total) by mouth daily. 06/23/18  Yes Lauraine Rinne, NP  ranitidine (ZANTAC) 150 MG tablet Take 150 mg by mouth 2 (two) times daily as needed for heartburn.   Yes [provider]  sevelamer carbonate (RENVELA) 800 MG tablet Take 3 tablets (2,400 mg total) by mouth 3 (three) times daily with meals. 07/28/18 08/27/18 Yes Arrien, Jimmy Picket, MD  Triamcinolone Acetonide (TRIAMCINOLONE 0.1 % CREAM : EUCERIN) CREA Apply 1 application topically 3 (three) times daily as needed. Apply to arms, back, and other areas with active rash/itching 04/19/18  Yes Rai, Vernelle Emerald, MD    Family History Family History  Problem Relation Age of Onset  . Diabetes Father   . Heart disease Father   . Diabetes Sister   . Alzheimer's disease Mother     Social History Social History   Tobacco Use  . Smoking status: Former Smoker    Packs/day: 2.00    Years: 32.00    Pack years: 64.00    Last attempt to quit: 11/27/1983    Years since quitting: 34.6  . Smokeless tobacco: Never Used  Substance Use Topics  . Alcohol use: Yes    Comment: rare  . Drug use: No      Allergies   Byetta 10 mcg pen [exenatide]; Codeine; and Coumadin [warfarin sodium]   Review of Systems Review of Systems  Constitutional: Positive for fever. Negative for chills.  HENT: Negative for sore throat and trouble swallowing.   Respiratory: Positive for cough, shortness of breath and wheezing.   Cardiovascular: Positive for chest pain. Negative for palpitations and leg swelling.  Gastrointestinal: Positive for diarrhea and nausea. Negative for abdominal pain and vomiting.  Musculoskeletal: Negative for back pain, myalgias and neck pain.  Skin: Negative for rash and wound.  Neurological: Negative for dizziness, light-headedness, numbness and headaches.  Psychiatric/Behavioral: Positive for confusion and hallucinations.  All other systems reviewed and are negative.    Physical Exam Updated Vital Signs BP  134/82 (BP Location: Right Arm)   Pulse 63   Temp 98.1 F (36.7 C) (Oral)   Resp 18   Ht 5\' 11"  (1.803 m)   Wt 108.9 kg   SpO2 100%   BMI 33.48 kg/m   Physical Exam  Constitutional: He is oriented to person, place, and time. He appears well-developed and well-nourished. No distress.  HENT:  Head: Normocephalic and atraumatic.  Mouth/Throat: Oropharynx is clear and moist. No oropharyngeal exudate.  Eyes: Pupils are equal, round, and reactive to light. EOM are normal.  Neck: Normal range of motion. Neck supple. No JVD present.  Cardiovascular: Normal rate and regular rhythm. Exam reveals no gallop and no friction rub.  No murmur heard. Pulmonary/Chest: Effort normal. He has wheezes.  Rhonchi throughout.  Expiratory wheezing  Abdominal: Soft. Bowel sounds are normal. There is no tenderness. There is no rebound and no guarding.  Musculoskeletal: Normal range of motion. He exhibits no edema or tenderness.  No lower extremity swelling, asymmetry or tenderness.  Distal pulses intact.  Lymphadenopathy:    He has no cervical adenopathy.  Neurological: He is alert  and oriented to person, place, and time.  Skin: Skin is warm and dry. Capillary refill takes less than 2 seconds. No rash noted. He is not diaphoretic. No erythema.  Psychiatric: He has a normal mood and affect. His behavior is normal.  Nursing note and vitals reviewed.    ED Treatments / Results  Labs (all labs ordered are listed, but only abnormal results are displayed) Labs Reviewed  CBC - Abnormal; Notable for the following components:      Result Value   RBC 3.02 (*)    Hemoglobin 8.9 (*)    HCT 27.9 (*)    All other components within normal limits  COMPREHENSIVE METABOLIC PANEL - Abnormal; Notable for the following components:   Chloride 95 (*)    Glucose, Bld 140 (*)    BUN 27 (*)    Creatinine, Ser 3.35 (*)    Total Protein 6.1 (*)    Albumin 3.0 (*)    GFR calc non Af Amer 16 (*)    GFR calc Af Amer 19 (*)    All other components within normal limits  BRAIN NATRIURETIC PEPTIDE - Abnormal; Notable for the following components:   B Natriuretic Peptide 3,146.0 (*)    All other components within normal limits  TROPONIN I - Abnormal; Notable for the following components:   Troponin I 2.75 (*)    All other components within normal limits  TROPONIN I - Abnormal; Notable for the following components:   Troponin I 3.37 (*)    All other components within normal limits  TROPONIN I - Abnormal; Notable for the following components:   Troponin I 2.63 (*)    All other components within normal limits  HEPARIN LEVEL (UNFRACTIONATED) - Abnormal; Notable for the following components:   Heparin Unfractionated 0.11 (*)    All other components within normal limits  CBC - Abnormal; Notable for the following components:   RBC 3.08 (*)    Hemoglobin 9.0 (*)    HCT 28.5 (*)    All other components within normal limits  GLUCOSE, CAPILLARY - Abnormal; Notable for the following components:   Glucose-Capillary 263 (*)    All other components within normal limits  GLUCOSE, CAPILLARY -  Abnormal; Notable for the following components:   Glucose-Capillary 179 (*)    All other components within normal limits  BASIC METABOLIC PANEL -  Abnormal; Notable for the following components:   Sodium 134 (*)    Chloride 96 (*)    Glucose, Bld 173 (*)    BUN 42 (*)    Creatinine, Ser 4.54 (*)    Calcium 8.5 (*)    GFR calc non Af Amer 11 (*)    GFR calc Af Amer 13 (*)    All other components within normal limits  GLUCOSE, CAPILLARY - Abnormal; Notable for the following components:   Glucose-Capillary 131 (*)    All other components within normal limits  I-STAT TROPONIN, ED - Abnormal; Notable for the following components:   Troponin i, poc 1.28 (*)    All other components within normal limits  C DIFFICILE QUICK SCREEN W PCR REFLEX  HEPARIN LEVEL (UNFRACTIONATED)    EKG EKG Interpretation  Date/Time:  Wednesday July 30 2018 09:37:23 EDT Ventricular Rate:  72 PR Interval:    QRS Duration: 209 QT Interval:  478 QTC Calculation: 524 R Axis:   -76 Text Interpretation:  A-V dual-paced rhythm with some inhibition No further analysis attempted due to paced rhythm Baseline wander in lead(s) II III aVF No significant change since last tracing Confirmed by Julianne Rice [phone]) on 07/30/2018 10:01:03 AM   Radiology Dg Chest Port 1 View  Result Date: 07/30/2018 CLINICAL DATA:  Increasing shortness of breath and chest pain today. EXAM: PORTABLE CHEST 1 VIEW COMPARISON:  Radiographs dated 07/27/2018 and 06/23/2018 FINDINGS: Stable cardiomegaly.  Pacemaker in place.  Aortic atherosclerosis. Interstitial markings are slightly accentuated due to the shallow inspiration. No acute infiltrates or effusions. No acute bone abnormality. IMPRESSION: No acute abnormality. Stable cardiomegaly. Accentuated markings due to a shallow inspiration. Electronically Signed   By: Lorriane Shire M.D.   On: 07/30/2018 11:41    Procedures Procedures (including critical care time)  Medications Ordered  in ED Medications  aspirin chewable tablet 324 mg (324 mg Oral Not Given 07/30/18 1032)  heparin ADULT infusion 100 units/mL (25000 units/224mL sodium chloride 0.45%) (1,900 Units/hr Intravenous New Bag/Given 07/31/18 0102)  aspirin EC tablet 81 mg (has no administration in time range)  azithromycin (ZITHROMAX) tablet 500 mg (has no administration in time range)  atorvastatin (LIPITOR) tablet 80 mg (80 mg Oral Given 07/30/18 1735)  metoprolol tartrate (LOPRESSOR) tablet 25 mg (25 mg Oral Given 07/30/18 2245)  nitroGLYCERIN (NITROSTAT) SL tablet 0.4 mg (has no administration in time range)  cinacalcet (SENSIPAR) tablet 30 mg (30 mg Oral Given 07/30/18 1735)  insulin detemir (LEVEMIR) injection 50 Units (50 Units Subcutaneous Given 07/30/18 2245)  pantoprazole (PROTONIX) EC tablet 40 mg (40 mg Oral Given 07/30/18 1735)  famotidine (PEPCID) tablet 20 mg (20 mg Oral Given 07/30/18 1735)  sevelamer carbonate (RENVELA) tablet 2,400 mg (2,400 mg Oral Not Given 07/31/18 0748)  mometasone-formoterol (DULERA) 200-5 MCG/ACT inhaler 2 puff (2 puffs Inhalation Given 07/30/18 2106)  guaiFENesin-dextromethorphan (ROBITUSSIN DM) 100-10 MG/5ML syrup 5 mL (has no administration in time range)  ipratropium-albuterol (DUONEB) 0.5-2.5 (3) MG/3ML nebulizer solution 3 mL (3 mLs Nebulization Given 07/30/18 1727)  insulin aspart (novoLOG) injection 0-15 Units (2 Units Subcutaneous Given 07/31/18 0711)  acetaminophen (TYLENOL) tablet 650 mg (has no administration in time range)    Or  acetaminophen (TYLENOL) suppository 650 mg (has no administration in time range)  zolpidem (AMBIEN) tablet 5 mg (has no administration in time range)  sorbitol 70 % solution 30 mL (has no administration in time range)  docusate sodium (ENEMEEZ) enema 283 mg (has no administration in time range)  ondansetron (ZOFRAN) tablet 4 mg (has no administration in time range)    Or  ondansetron (ZOFRAN) injection 4 mg (has no administration in time range)    camphor-menthol (SARNA) lotion 1 application (has no administration in time range)    And  hydrOXYzine (ATARAX/VISTARIL) tablet 25 mg (has no administration in time range)  calcium carbonate (dosed in mg elemental calcium) suspension 500 mg of elemental calcium (has no administration in time range)  feeding supplement (NEPRO CARB STEADY) liquid 237 mL (has no administration in time range)  sodium chloride flush (NS) 0.9 % injection 3 mL (3 mLs Intravenous Not Given 07/30/18 2341)  sodium chloride flush (NS) 0.9 % injection 3 mL (has no administration in time range)  0.9 %  sodium chloride infusion (has no administration in time range)  insulin aspart (novoLOG) injection 0-5 Units (0 Units Subcutaneous Not Given 07/30/18 2340)  clopidogrel (PLAVIX) tablet 75 mg (75 mg Oral Given 07/30/18 1735)  sodium chloride flush (NS) 0.9 % injection 3 mL (3 mLs Intravenous Not Given 07/30/18 2341)  sodium chloride flush (NS) 0.9 % injection 3 mL (has no administration in time range)  0.9 %  sodium chloride infusion (has no administration in time range)  0.9 %  sodium chloride infusion (has no administration in time range)  heparin bolus via infusion 4,000 Units (4,000 Units Intravenous Bolus from Bag 07/30/18 1404)  aspirin chewable tablet 81 mg (81 mg Oral Given 07/31/18 0701)  heparin bolus via infusion 2,000 Units (2,000 Units Intravenous Bolus from Bag 07/30/18 2339)   CRITICAL CARE Performed by: Julianne Rice Total critical care time: 25 minutes Critical care time was exclusive of separately billable procedures and treating other patients. Critical care was necessary to treat or prevent imminent or life-threatening deterioration. Critical care was time spent personally by me on the following activities: development of treatment plan with patient and/or surrogate as well as nursing, discussions with consultants, evaluation of patient's response to treatment, examination of patient, obtaining history from patient  or surrogate, ordering and performing treatments and interventions, ordering and review of laboratory studies, ordering and review of radiographic studies, pulse oximetry and re-evaluation of patient's condition.  Initial Impression / Assessment and Plan / ED Course  I have reviewed the triage vital signs and the nursing notes.  Pertinent labs & imaging results that were available during my care of the patient were reviewed by me and considered in my medical decision making (see chart for details).    Discussed with cardiology who will see the patient.  Hospitalist to admit.   Final Clinical Impressions(s) / ED Diagnoses   Final diagnoses:  NSTEMI (non-ST elevated myocardial infarction) [facility])    ED Discharge Orders    None       Julianne Rice, MD 07/31/18 [id number]    Julianne Rice, MD 08/14/18 [phone]

## 2018-07-30 NOTE — ED Notes (Signed)
Information given to Dr. Lorin Mercy about [employer] rep

## 2018-07-30 NOTE — Progress Notes (Signed)
Subjective:  Recent dc 07/28/18  Pulm edema/Vol excess/ lean body wt loss and Copd exacerbation lower edw /neb./steroids /abx Rx.Now readmitted  With CP last night and went to OP  HD today after 2 hr on  HD more Chest pain with Troponin up to 1.28 (high than recent NSTEMI Admit 03/2018) . Admitted wit NSTEMI with Card plans for Card cath tomor.   Currently in ER denies Cp Or Sob , 100% O2 sat on 4 l [place] O2. Noted he uses home O2 with 3l [place] . CXR  with accentuated markings due to shallow inspiration /no acute abnormality.  Objective Vital signs in last 24 hours: Vitals:   07/30/18 1317 07/30/18 1332 07/30/18 1348 07/30/18 1401  BP:  (!) 118/57  (!) 120/48  Pulse: (!) 57 (!) 55 (!) 33 (!) 57  Resp: 18 (!) 24 20 (!) 24  Temp:      TempSrc:      SpO2: 100% 99% 100% 100%  Weight:      Height:       Weight change:   Physical Exam: General: alert ,elderly obese WM NAD, Calm Heart: RRR, no mur, rub, gal Lungs: Expiratory  Bilateral  Rhonchi, non labored breathing   Abdomen: obese, soft, NT, ND Extremities: Trace pretibial;l edema  Dialysis Access: Pos bruit   Neuro- alert OX3 ,moves all extrem. Appropriate     OP Dialysis: mwf nw  4h  105k/   104.5 kg   2K/ 3.5Ca  Hep none  LUA AVF Hec 31mcg IV/HD Mircera 60 mcg q 2 wks, last given 07/21/18 (last op hgb 10.5 (8/28) Venofer 50mg  weely  Problem/Plan: 1.  ESRD - HD on mwf  Had 2 hrs  Today  And no acute needs for hd today  Next on Friday /labs ok  Today  2. NSTEMI/Unstable angina/ ho CAD sig("not CABG candidate") - for cardic cath in am 3. HTN/volume / ICM  EF 40-50% -  Bp/ vol stable  bp med Lopresor 25mg  bid  as op  4. 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 Secondary hyperparathyroidism -  hec on hd , binder with meals Renvela 3,and sensipar 30 mg q day  8. DM type 2- per admit  9. Nutrition  - Rena/ ,carb  diet,renal vitamin  Ernest Haber, PA-C [place] [phone] 07/30/2018,2:03 PM  LOS: 0 days   Labs: Basic Metabolic Panel: Recent Labs  Lab 07/27/18 0708 07/28/18 0401 07/30/18 0955  NA 132* 131* 136  K 3.8 3.5 3.7  CL 97* 94* 95*  CO2 21* 22 28  GLUCOSE 321* 330* 140*  BUN 49* 62* 27*  CREATININE 5.40* 5.97* 3.35*  CALCIUM 8.4* 8.2* 9.1   Liver Function Tests: Recent Labs  Lab 07/30/18 0955  AST 19  ALT 14  ALKPHOS 67  BILITOT 1.1  PROT 6.1*  ALBUMIN 3.0*   CBC: Recent Labs  Lab 07/26/18 2347 07/27/18 0206 07/27/18 0708 07/28/18 0846 07/30/18 0946  WBC 10.5 9.2 7.2 9.6 9.8  NEUTROABS  --   --  6.5  --   --   HGB 10.2* 9.5* 9.5* 8.5* 8.9*  HCT 32.4* 29.4* 29.7* 26.3* 27.9*  MCV 93.1 92.5 93.4 90.7 92.4  PLT 261 219 187 226 233   Cardiac Enzymes: Recent Labs  Lab 07/27/18 0206 07/27/18 0708  TROPONINI 0.32* 0.23*   CBG: Recent Labs  Lab 07/27/18 1242 07/27/18 1638 07/27/18 2127 07/28/18 0735 07/28/18 1320  GLUCAP 432* 453* 329* 314* 123*    Studies/Results: Dg Chest Port 1 View  Result Date: 07/30/2018 CLINICAL DATA:  Increasing shortness of breath and chest pain today. EXAM: PORTABLE CHEST 1 VIEW COMPARISON:  Radiographs dated 07/27/2018 and 06/23/2018 FINDINGS: Stable cardiomegaly.  Pacemaker in place.  Aortic atherosclerosis. Interstitial markings are slightly accentuated due to the shallow inspiration. No acute infiltrates or effusions. No acute bone abnormality. IMPRESSION: No acute abnormality. Stable cardiomegaly. Accentuated markings due to a shallow inspiration. Electronically Signed   By: Lorriane Shire M.D.   On: 07/30/2018 11:41   Medications: . sodium chloride    . heparin     . aspirin  324 mg Oral Once  . [START ON 07/31/2018] aspirin EC  81 mg Oral Daily  . atorvastatin  80 mg Oral Daily  . azithromycin  500 mg Oral Daily  . cinacalcet  30 mg Oral Q supper  . clopidogrel  75 mg Oral Daily  . famotidine   20 mg Oral Daily  . heparin  4,000 Units Intravenous Once  . insulin aspart  0-15 Units Subcutaneous TID WC  . insulin aspart  0-5 Units Subcutaneous QHS  . insulin detemir  50 Units Subcutaneous QHS  . metoprolol tartrate  25 mg Oral BID  . mometasone-formoterol  2 puff Inhalation BID  . pantoprazole  40 mg Oral Daily  . sevelamer carbonate  2,400 mg Oral TID WC  . sodium chloride flush  3 mL Intravenous Q12H

## 2018-07-30 NOTE — ED Triage Notes (Signed)
Pt arrives EMS from dialysis where he was 1 hour into a 4 hour run and startede having chest pain that radiated to left jaw. Pt took 2 sl nitro and brought pain from 10/10 to 5/10. Given aspirin for total of 324 mg pta. Hx of pacemaker.initial BP 90/40 and given 200 cc ns. bringing bp to 140/50.  non productive cough

## 2018-07-30 NOTE — ED Notes (Signed)
IV attempted without success. 

## 2018-07-30 NOTE — ED Notes (Signed)
MD at bedside. 

## 2018-07-30 NOTE — ED Notes (Signed)
IV team at bedside 

## 2018-07-30 NOTE — ED Notes (Signed)
CRITICAL VALUE ALERT  Critical Value:  I-stat troponin 1.28   Date & Time Notied:  07/30/18 @ 1021   Provider Notified: Lita Mains, MD and Felix Ahmadi, RN   Orders Received/Actions taken:

## 2018-07-30 NOTE — ED Notes (Signed)
No CP currently.

## 2018-07-30 NOTE — Progress Notes (Signed)
Pt received from ED. VSS. CHG complete. Pt oriented to room and unit. Dinner tray ordered. Will continue to monitor.  [name] Canterbury, RN

## 2018-07-30 NOTE — ED Notes (Signed)
Attempt to interrogate pacer fails. Pt states he has [employer]tr[employer].

## 2018-07-30 NOTE — ED Notes (Signed)
[employer] rep contacted who, after serching data base states that leads are [employer]onic but device is [employer] and will not work with their equipment. Number given to this nurse by Tomi Bamberger, [employer] rep for [employer] who is the biotronic [phone]  This number and updated information called to [facility] on 4E

## 2018-07-31 ENCOUNTER — Ambulatory Visit (INDEPENDENT_AMBULATORY_CARE_PROVIDER_SITE_OTHER): Payer: Medicare Other | Admitting: *Deleted

## 2018-07-31 ENCOUNTER — Encounter (HOSPITAL_COMMUNITY)
Admission: EM | Disposition: A | Discharge status: Home / Self Care | Payer: Self-pay | Source: Ambulatory Visit | Attending: Family Medicine

## 2018-07-31 DIAGNOSIS — I4891 Unspecified atrial fibrillation: Secondary | ICD-10-CM

## 2018-07-31 DIAGNOSIS — R55 Syncope and collapse: Secondary | ICD-10-CM | POA: Diagnosis not present

## 2018-07-31 DIAGNOSIS — E1122 Type 2 diabetes mellitus with diabetic chronic kidney disease: Secondary | ICD-10-CM

## 2018-07-31 DIAGNOSIS — I10 Essential (primary) hypertension: Secondary | ICD-10-CM

## 2018-07-31 DIAGNOSIS — Z794 Long term (current) use of insulin: Secondary | ICD-10-CM

## 2018-07-31 DIAGNOSIS — I2510 Atherosclerotic heart disease of native coronary artery without angina pectoris: Secondary | ICD-10-CM

## 2018-07-31 DIAGNOSIS — Z992 Dependence on renal dialysis: Secondary | ICD-10-CM

## 2018-07-31 DIAGNOSIS — N186 End stage renal disease: Secondary | ICD-10-CM

## 2018-07-31 DIAGNOSIS — E7800 Pure hypercholesterolemia, unspecified: Secondary | ICD-10-CM

## 2018-07-31 HISTORY — PX: CORONARY ANGIOGRAPHY: CATH118303

## 2018-07-31 HISTORY — PX: CORONARY BALLOON ANGIOPLASTY: CATH118233

## 2018-07-31 LAB — BASIC METABOLIC PANEL
Anion gap: 15 (ref 5–15)
BUN: 42 mg/dL — AB (ref 8–23)
CALCIUM: 8.5 mg/dL — AB (ref 8.9–10.3)
CHLORIDE: 96 mmol/L — AB (ref 98–111)
CO2: 23 mmol/L (ref 22–32)
CREATININE: 4.54 mg/dL — AB (ref 0.61–1.24)
GFR calc non Af Amer: 11 mL/min — ABNORMAL LOW (ref >60)
GFR, EST AFRICAN AMERICAN: 13 mL/min — AB (ref >60)
GLUCOSE: 173 mg/dL — AB (ref 70–99)
Potassium: 4.4 mmol/L (ref 3.5–5.1)
Sodium: 134 mmol/L — ABNORMAL LOW (ref 135–145)

## 2018-07-31 LAB — POCT ACTIVATED CLOTTING TIME
Activated Clotting Time: 406 seconds
Activated Clotting Time: 219 seconds

## 2018-07-31 LAB — CBC
HEMATOCRIT: 28.5 % — AB (ref 39.0–52.0)
Hemoglobin: 9 g/dL — ABNORMAL LOW (ref 13.0–17.0)
MCH: 29.2 pg (ref 26.0–34.0)
MCHC: 31.6 g/dL (ref 30.0–36.0)
MCV: 92.5 fL (ref 78.0–100.0)
Platelets: 224 10*3/uL (ref 150–400)
RBC: 3.08 MIL/uL — ABNORMAL LOW (ref 4.22–5.81)
RDW: 15.3 % (ref 11.5–15.5)
WBC: 10.1 10*3/uL (ref 4.0–10.5)

## 2018-07-31 LAB — GLUCOSE, CAPILLARY
GLUCOSE-CAPILLARY: 49 mg/dL — AB (ref 70–99)
GLUCOSE-CAPILLARY: 86 mg/dL (ref 70–99)
GLUCOSE-CAPILLARY: 185 mg/dL — AB (ref 70–99)
GLUCOSE-CAPILLARY: 75 mg/dL (ref 70–99)
Glucose-Capillary: 131 mg/dL — ABNORMAL HIGH (ref 70–99)
Glucose-Capillary: 48 mg/dL — ABNORMAL LOW (ref 70–99)
Glucose-Capillary: 206 mg/dL — ABNORMAL HIGH (ref 70–99)

## 2018-07-31 LAB — HEPARIN LEVEL (UNFRACTIONATED): Heparin Unfractionated: 0.4 IU/mL (ref 0.30–0.70)

## 2018-07-31 LAB — TROPONIN I: Troponin I: 2.63 ng/mL (ref <0.03)

## 2018-07-31 SURGERY — CORONARY ANGIOGRAPHY (CATH LAB)
Anesthesia: LOCAL

## 2018-07-31 MED ORDER — HEART ATTACK BOUNCING BOOK
Freq: Once | Status: AC
  Administered 2018-07-31: 22:00:00
  Filled 2018-07-31: qty 1

## 2018-07-31 MED ORDER — CHLORHEXIDINE GLUCONATE CLOTH 2 % EX PADS
6.0000 | MEDICATED_PAD | Freq: Every day | CUTANEOUS | Status: DC
  Administered 2018-07-31 – 2018-08-01 (×2): 6 via TOPICAL

## 2018-07-31 MED ORDER — SODIUM CHLORIDE 0.9% FLUSH
3.0000 mL | Freq: Two times a day (BID) | INTRAVENOUS | Status: DC
  Administered 2018-07-31: 3 mL via INTRAVENOUS

## 2018-07-31 MED ORDER — SODIUM CHLORIDE 0.9 % IV SOLN: 250.0000 mL | INTRAVENOUS | Status: DC | PRN

## 2018-07-31 MED ORDER — HEPARIN (PORCINE) IN NACL 1000-0.9 UT/500ML-% IV SOLN
INTRAVENOUS | Status: DC | PRN
  Administered 2018-07-31 (×2): 500 mL

## 2018-07-31 MED ORDER — SODIUM CHLORIDE 0.9 % IV SOLN
INTRAVENOUS | Status: DC | PRN
  Administered 2018-07-31: 1 mg/kg/h via INTRAVENOUS

## 2018-07-31 MED ORDER — MIDAZOLAM HCL 2 MG/2ML IJ SOLN
INTRAMUSCULAR | Status: DC | PRN
  Administered 2018-07-31: 1 mg via INTRAVENOUS

## 2018-07-31 MED ORDER — HEPARIN SODIUM (PORCINE) 5000 UNIT/ML IJ SOLN
5000.0000 [IU] | Freq: Three times a day (TID) | INTRAMUSCULAR | Status: DC
  Administered 2018-08-01 (×2): 5000 [IU] via SUBCUTANEOUS
  Filled 2018-07-31 (×2): qty 1

## 2018-07-31 MED ORDER — LIDOCAINE HCL (PF) 1 % IJ SOLN
INTRAMUSCULAR | Status: DC | PRN
  Administered 2018-07-31: 15 mL via INTRADERMAL

## 2018-07-31 MED ORDER — BIVALIRUDIN TRIFLUOROACETATE 250 MG IV SOLR
INTRAVENOUS | Status: AC
  Filled 2018-07-31: qty 250

## 2018-07-31 MED ORDER — ANGIOPLASTY BOOK
Freq: Once | Status: AC
  Administered 2018-07-31: 22:00:00
  Filled 2018-07-31: qty 1

## 2018-07-31 MED ORDER — HEPARIN (PORCINE) IN NACL 1000-0.9 UT/500ML-% IV SOLN
INTRAVENOUS | Status: AC
  Filled 2018-07-31: qty 1000

## 2018-07-31 MED ORDER — MIDAZOLAM HCL 2 MG/2ML IJ SOLN
INTRAMUSCULAR | Status: AC
  Filled 2018-07-31: qty 2

## 2018-07-31 MED ORDER — GLUCOSE 40 % PO GEL
ORAL | Status: AC
  Administered 2018-07-31: 37.5 g
  Filled 2018-07-31: qty 1

## 2018-07-31 MED ORDER — SODIUM CHLORIDE 0.9% FLUSH: 3.0000 mL | INTRAVENOUS | Status: DC | PRN

## 2018-07-31 MED ORDER — IOHEXOL 350 MG/ML SOLN
INTRAVENOUS | Status: DC | PRN
  Administered 2018-07-31: 110 mL via INTRA_ARTERIAL

## 2018-07-31 MED ORDER — FENTANYL CITRATE (PF) 100 MCG/2ML IJ SOLN
INTRAMUSCULAR | Status: DC | PRN
  Administered 2018-07-31: 25 ug via INTRAVENOUS

## 2018-07-31 MED ORDER — FENTANYL CITRATE (PF) 100 MCG/2ML IJ SOLN
INTRAMUSCULAR | Status: AC
  Filled 2018-07-31: qty 2

## 2018-07-31 MED ORDER — SODIUM CHLORIDE 0.9 % IV SOLN
INTRAVENOUS | Status: AC | PRN
  Administered 2018-07-31: 10 mL/h via INTRAVENOUS

## 2018-07-31 MED ORDER — LIDOCAINE HCL (PF) 1 % IJ SOLN
INTRAMUSCULAR | Status: AC
  Filled 2018-07-31: qty 30

## 2018-07-31 MED ORDER — BIVALIRUDIN BOLUS VIA INFUSION - CUPID
INTRAVENOUS | Status: DC | PRN
  Administered 2018-07-31: 81.675 mg via INTRAVENOUS

## 2018-07-31 SURGICAL SUPPLY — 20 items
BALLN WOLVERINE 3.00X10 (BALLOONS) ×2
BALLN WOLVERINE 4.00X10 (BALLOONS) ×2
BALLN ~~LOC~~ EMERGE MR 4.5X12 (BALLOONS) ×2
BALLOON WOLVERINE 3.00X10 (BALLOONS) IMPLANT
BALLOON WOLVERINE 4.00X10 (BALLOONS) IMPLANT
BALLOON ~~LOC~~ EMERGE MR 4.5X12 (BALLOONS) IMPLANT
CATH INFINITI MULTIPACK ST 5F (CATHETERS) ×1 IMPLANT
CATH LAUNCHER 6FR EBU3.5 (CATHETERS) ×1 IMPLANT
HOVERMATT SINGLE USE (MISCELLANEOUS) ×1 IMPLANT
KIT ENCORE 26 ADVANTAGE (KITS) ×1 IMPLANT
KIT HEART LEFT (KITS) ×2 IMPLANT
KIT HEMO VALVE WATCHDOG (MISCELLANEOUS) ×1 IMPLANT
PACK CARDIAC CATHETERIZATION (CUSTOM PROCEDURE TRAY) ×2 IMPLANT
SHEATH PINNACLE 5F 10CM (SHEATH) ×1 IMPLANT
SHEATH PINNACLE 6F 10CM (SHEATH) ×2 IMPLANT
SHEATH PROBE COVER 6X72 (BAG) ×1 IMPLANT
TRANSDUCER W/STOPCOCK (MISCELLANEOUS) ×2 IMPLANT
TUBING CIL FLEX 10 FLL-RA (TUBING) ×2 IMPLANT
WIRE ASAHI PROWATER 180CM (WIRE) ×1 IMPLANT
WIRE EMERALD 3MM-J .035X150CM (WIRE) ×1 IMPLANT

## 2018-07-31 NOTE — Progress Notes (Signed)
ANTICOAGULATION CONSULT NOTE - Follow Up Consult  Pharmacy Consult for Heparin Indication: chest pain/ACS  Allergies  Allergen Reactions  . Byetta 10 Mcg Pen [Exenatide] Diarrhea and Nausea And Vomiting  . Codeine Itching  . Coumadin [Warfarin Sodium] Rash    Patient Measurements: Height: 5\' 11"  (180.3 cm) Weight: 240 lb 1.3 oz (108.9 kg) IBW/kg (Calculated) : 75.3 Heparin Dosing Weight: 98 kg  Vital Signs: Temp: 98.1 F (36.7 C) (09/05 0809) Temp Source: Oral (09/05 0809) BP: 134/82 (09/05 0809) Pulse Rate: 63 (09/05 0809)  Labs: Recent Labs    07/30/18 0946 07/30/18 0955 07/30/18 1722 07/30/18 2145 07/31/18 0358 07/31/18 0711  HGB 8.9*  --   --   --  9.0*  --   HCT 27.9*  --   --   --  28.5*  --   PLT 233  --   --   --  224  --   HEPARINUNFRC  --   --   --  0.11*  --  0.40  CREATININE  --  3.35*  --   --  4.54*  --   TROPONINI  --   --  2.75* 3.37* 2.63*  --     Estimated Creatinine Clearance: 16.6 mL/min (A) (by C-G formula based on SCr of 4.54 mg/dL (H)).  Assessment:  77 yr old male continues on IV heparin for ACS/chest pain/NSTEMI. Hx CAD, TAVR, prior stents and atherectomy.  Hx atrial fibrillation but not on Coumadin due to rash, and not on DOAC due to ESRD.   Heparin level is now therapeutic (0.44) on 1900 units/hr.  Cardiac cath planned for later today.  Goal of Therapy:  Heparin level 0.3-0.7 units/ml Monitor platelets by anticoagulation protocol: Yes   Plan:   Continue heparin drip at 1900 units/hr  Daily heparin level and CBC while on heparin.  Follow up post-cath.  Arty Baumgartner, [place] Pager: [phone] or phome: [phone] 07/31/2018,9:35 AM

## 2018-07-31 NOTE — Progress Notes (Signed)
Remote pacemaker transmission.   

## 2018-07-31 NOTE — Progress Notes (Signed)
[facility] Progress Note  Subjective: no c/o, chest pain resolved  Vitals:   07/31/18 0005 07/31/18 0508 07/31/18 0809 07/31/18 0825  BP: (!) 123/56 123/60 134/82   Pulse: (!) 57 71 63   Resp: 19 19 18    Temp: 98.3 F (36.8 C) (!) 97.5 F (36.4 C) 98.1 F (36.7 C)   TempSrc: Oral Oral Oral   SpO2: 98% 99% 100% 100%  Weight:  108.9 kg    Height:        Inpatient medications: . aspirin  324 mg Oral Once  . aspirin EC  81 mg Oral Daily  . atorvastatin  80 mg Oral Daily  . azithromycin  500 mg Oral Daily  . cinacalcet  30 mg Oral Q supper  . clopidogrel  75 mg Oral Daily  . famotidine  20 mg Oral Daily  . insulin aspart  0-15 Units Subcutaneous TID WC  . insulin aspart  0-5 Units Subcutaneous QHS  . insulin detemir  50 Units Subcutaneous QHS  . metoprolol tartrate  25 mg Oral BID  . mometasone-formoterol  2 puff Inhalation BID  . pantoprazole  40 mg Oral Daily  . sevelamer carbonate  2,400 mg Oral TID WC  . sodium chloride flush  3 mL Intravenous Q12H  . sodium chloride flush  3 mL Intravenous Q12H   . sodium chloride    . sodium chloride    . sodium chloride    . heparin 1,900 Units/hr (07/31/18 0102)   sodium chloride, sodium chloride, acetaminophen **OR** acetaminophen, calcium carbonate (dosed in mg elemental calcium), camphor-menthol **AND** hydrOXYzine, docusate sodium, feeding supplement (NEPRO CARB STEADY), guaiFENesin-dextromethorphan, ipratropium-albuterol, nitroGLYCERIN, ondansetron **OR** ondansetron (ZOFRAN) IV, sodium chloride flush, sodium chloride flush, sorbitol, zolpidem  Iron/TIBC/Ferritin/ %Sat    Component Value Date/Time   IRON 59 01/12/2015 0730   TIBC 227 01/12/2015 0730   FERRITIN 189 01/12/2015 0730   IRONPCTSAT 26 01/12/2015 0730    Exam: General: alert ,elderly obese WM NAD, Calm Heart: RRR, no mur, rub, gal Lungs: occ rhonchi, mostly clear non labored breathing   Abdomen: obese, soft, NT, ND Extremities: Trace  pretibial;l edema  Dialysis Access: Pos bruit   Neuro- alert OX3 ,moves all extrem. Appropriate     OP Dialysis:mwf nw 4h 105k/   104.5 kg  2K/ 3.5Ca Hep none LUA AVF Hec 1mcg IV/HD Mircera 60 mcg q 2 wks, last given 07/21/18 (last op hgb 10.5 (8/28) Venofer 50mg  weely  Problems/Plan: 1.  ESRD - HD MWF.  HD tomorrow.  2. Chest pain / +trop - hx CAD sig("not CABG candidate") - for cardic cath today 3. HTN/volume / ICM  EF 40-50% -  Bp/ vol stable  bp med Lopresor 25mg  bid  as op  4. 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 Secondary hyperparathyroidism -  hec on hd , binder with meals Renvela 3,and sensipar 30 mg q day  8. DM type 2- per admit  9. Nutrition  - Rena/ ,carb diet,renal vitamin    Kelly Splinter MD [facility] pager [phone]   07/31/2018, 10:47 AM   Recent Labs  Lab 07/30/18 0955 07/31/18 0358  NA 136 134*  K 3.7 4.4  CL 95* 96*  CO2 28 23  GLUCOSE 140* 173*  BUN 27* 42*  CREATININE 3.35* 4.54*  CALCIUM 9.1 8.5*  ALBUMIN 3.0*  --    Recent Labs  Lab 07/30/18 0955  AST 19  ALT 14  ALKPHOS 67  BILITOT 1.1  PROT 6.1*   Recent Labs  Lab 07/27/18 0708  07/30/18 0946 07/31/18 0358  WBC 7.2   < > 9.8 10.1  NEUTROABS 6.5  --   --   --   HGB 9.5*   < > 8.9* 9.0*  HCT 29.7*   < > 27.9* 28.5*  MCV 93.4   < > 92.4 92.5  PLT 187   < > 233 224   < > = values in this interval not displayed.

## 2018-07-31 NOTE — Progress Notes (Signed)
Inpatient Diabetes Program Recommendations  AACE/ADA: New Consensus Statement on Inpatient Glycemic Control (2015)  Target Ranges:  Prepandial:   less than 140 mg/dL      Peak postprandial:   less than 180 mg/dL (1-2 hours)      Critically ill patients:  140 - 180 mg/dL   Lab Results  Component Value Date   GLUCAP 131 (H) 07/31/2018   HGBA1C 7.4 (H) 04/14/2018    Review of Glycemic Control Results for Jeffrey [name], Jeffrey [name] (MRN [id number]) as of 07/31/2018 09:57  Ref. Range 07/30/2018 17:06 07/30/2018 20:51 07/31/2018 06:08  Glucose-Capillary Latest Ref Range: 70 - 99 mg/dL 263 (H) 179 (H) 131 (H)   Diabetes history: Type 2 DM Outpatient Diabetes medications: Levemir 50 units QHS Current orders for Inpatient glycemic control: Levemir 50 units QHS, Novolog 0-15 units TID, Novolog 0-5 units QHS  Inpatient Diabetes Program Recommendations:    May want to consider checking another A1C. Last one from 04/14/18.   Blood Sugars look good this am.   However, given renal status, may want to reduce correction to Novolog 0-9 units TID to prevent lows. If FSBS is 80mg /dL, consider reducing Levemir to 40 units QHS.   Thanks, Bronson Curb, MSN, RNC-OB Diabetes Coordinator [phone] (8a-5p)

## 2018-07-31 NOTE — Progress Notes (Signed)
Patient says he had some chest pain earlier but did not want to notify staff because "I thought I would fight it off". Patient currently reports zero chest pain. Patient re-educated on the absolute necessity of reporting any pain especially chest, neck and shoulder pain. Patient promises to report any pain to staff going forward. Will continue to monitor.

## 2018-07-31 NOTE — Progress Notes (Signed)
[facility]  PROGRESS NOTE  Jeffrey [name] KGM:[id number] DOB: 1941-03-10 DOA: 07/30/2018 PCP: Thurman Coyer, MD   Brief HPI:   77 year old male with a history of diabetes mellitus, CAD, hypertension, pacemaker placement, ESRD, Monday Wednesday Friday hemodialysis, CVA, COPD, severe aortic stenosis status post TAVR, atrial fibrillation not on anticoagulation came to hospital with chest pain. Patient was seen by cardiology, plan for left heart cath.    Subjective   This morning patient denies any chest pain or shortness of breath.   Assessment/Plan:     1. Acute coronary syndrome-non-ST elevation MI, troponin was elevated to 2.63, patient started on heparin protocol, aspirin, Plavix, statin, beta-blocker.  Plan for cardiac cath today.  Cardiology following  2. Hypertension-blood pressure stable, continue Lopressor  3. Hyperlipidemia-continue Lipitor  4. Diabetes mellitus type 2-continue Levemir, moderate sliding scale insulin with NovoLog  5. COPD-not in exacerbation, continue home O2  6. ESRD-patient on hemodialysis, nephrology following  7. Atrial fibrillation-heart rate is controlled,CHA2DS2VASc score is 8, not on Coumadin due to rash and not on NOAC due to ESRD     CBG: Recent Labs  Lab 07/31/18 0608 07/31/18 1111 07/31/18 1129 07/31/18 1214 07/31/18 1358  GLUCAP 131* 48* 49* 86 206*    CBC: Recent Labs  Lab 07/27/18 0206 07/27/18 0708 07/28/18 0846 07/30/18 0946 07/31/18 0358  WBC 9.2 7.2 9.6 9.8 10.1  NEUTROABS  --  6.5  --   --   --   HGB 9.5* 9.5* 8.5* 8.9* 9.0*  HCT 29.4* 29.7* 26.3* 27.9* 28.5*  MCV 92.5 93.4 90.7 92.4 92.5  PLT 219 187 226 233 644    Basic Metabolic Panel: Recent Labs  Lab 07/26/18 2347 07/27/18 0206 07/27/18 0708 07/28/18 0401 07/30/18 0955 07/31/18 0358  NA 132*  --  132* 131* 136 134*  K 3.9  --  3.8 3.5 3.7 4.4  CL 93*  --  97* 94* 95* 96*  CO2 25  --  21* 22 28 23   GLUCOSE 237*  --  321* 330* 140*  173*  BUN 43*  --  49* 62* 27* 42*  CREATININE 4.89* 5.18* 5.40* 5.97* 3.35* 4.54*  CALCIUM 8.9  --  8.4* 8.2* 9.1 8.5*     DVT prophylaxis: Heparin drip  Code Status: Full code  Family Communication: No family at bedside  Disposition Plan: likely home when medically ready for discharge   Consultants:  Cardiology  Procedures:     Antibiotics:   Anti-infectives (From admission, onward)   Start     Dose/Rate Route Frequency Ordered Stop   07/30/18 1315  [MAR Hold]  azithromycin (ZITHROMAX) tablet 500 mg     (MAR Hold since Thu 07/31/2018 at 1422. Reason: Transfer to a Procedural area.)   500 mg Oral Daily 07/30/18 1312         Objective   Vitals:   07/31/18 0809 07/31/18 0825 07/31/18 1341 07/31/18 1435  BP: 134/82  (!) 130/94   Pulse: 63  (!) 55   Resp: 18  18   Temp: 98.1 F (36.7 C)  (!) 97.4 F (36.3 C)   TempSrc: Oral  Oral   SpO2: 100% 100% 100% 100%  Weight:      Height:        Intake/Output Summary (Last 24 hours) at 07/31/2018 1507 Last data filed at 07/31/2018 1210 Gross per 24 hour  Intake 3 ml  Output -  Net 3 ml   Filed Weights   07/30/18 0943 07/31/18 0347  Weight: 107 kg 108.9 kg     Physical Examination:    General: Appears in no acute distress  Cardiovascular: S1-S2, regular, no murmurs auscultated  Respiratory: Clear to auscultation bilaterally, no wheezing  Abdomen: Soft, nontender, no organomegaly  Extremities: No edema in the lower extremities  Neurologic: Alert, oriented x3, no focal deficit noted     Data Reviewed: I have personally reviewed following labs and imaging studies   Recent Results (from the past 240 hour(s))  MRSA PCR Screening     Status: None   Collection Time: 07/27/18  3:27 AM  Result Value Ref Range Status   MRSA by PCR NEGATIVE NEGATIVE Final    Comment:        The GeneXpert MRSA Assay (FDA approved for NASAL specimens only), is one component of a comprehensive MRSA  colonization surveillance program. It is not intended to diagnose MRSA infection nor to guide or monitor treatment for MRSA infections. Performed at [facility], [place] [address], [place], [place] [address]      Liver Function Tests: Recent Labs  Lab 07/30/18 0955  AST 19  ALT 14  ALKPHOS 67  BILITOT 1.1  PROT 6.1*  ALBUMIN 3.0*   No results for input(s): LIPASE, AMYLASE in the last 168 hours. No results for input(s): AMMONIA in the last 168 hours.  Cardiac Enzymes: Recent Labs  Lab 07/27/18 0206 07/27/18 0708 07/30/18 1722 07/30/18 2145 07/31/18 0358  TROPONINI 0.32* 0.23* 2.75* 3.37* 2.63*   BNP (last 3 results) Recent Labs    11/14/17 1238 07/27/18 0129 07/30/18 0955  BNP 159.6* 2,421.8* 3,146.0*    ProBNP (last 3 results) No results for input(s): PROBNP in the last 8760 hours.    Studies: Dg Chest Port 1 View  Result Date: 07/30/2018 CLINICAL DATA:  Increasing shortness of breath and chest pain today. EXAM: PORTABLE CHEST 1 VIEW COMPARISON:  Radiographs dated 07/27/2018 and 06/23/2018 FINDINGS: Stable cardiomegaly.  Pacemaker in place.  Aortic atherosclerosis. Interstitial markings are slightly accentuated due to the shallow inspiration. No acute infiltrates or effusions. No acute bone abnormality. IMPRESSION: No acute abnormality. Stable cardiomegaly. Accentuated markings due to a shallow inspiration. Electronically Signed   By: Lorriane Shire M.D.   On: 07/30/2018 11:41    Scheduled Meds: . [MAR Hold] aspirin  324 mg Oral Once  . [MAR Hold] aspirin EC  81 mg Oral Daily  . [MAR Hold] atorvastatin  80 mg Oral Daily  . [MAR Hold] azithromycin  500 mg Oral Daily  . [MAR Hold] Chlorhexidine Gluconate Cloth  6 each Topical Q0600  . [MAR Hold] cinacalcet  30 mg Oral Q supper  . [MAR Hold] clopidogrel  75 mg Oral Daily  . [MAR Hold] famotidine  20 mg Oral Daily  . [MAR Hold] insulin aspart  0-15 Units Subcutaneous TID WC  . [MAR Hold] insulin aspart   0-5 Units Subcutaneous QHS  . [MAR Hold] insulin detemir  50 Units Subcutaneous QHS  . [MAR Hold] metoprolol tartrate  25 mg Oral BID  . [MAR Hold] mometasone-formoterol  2 puff Inhalation BID  . [MAR Hold] pantoprazole  40 mg Oral Daily  . [MAR Hold] sevelamer carbonate  2,400 mg Oral TID WC  . [MAR Hold] sodium chloride flush  3 mL Intravenous Q12H  . sodium chloride flush  3 mL Intravenous Q12H      Time spent: 25 min  [facility] Pager [phone]. If 7PM-7AM, please contact night-coverage at [email or web address],  Office  [phone]  password [email or web address]  07/31/2018, 3:07 PM  LOS: 1 day

## 2018-07-31 NOTE — Progress Notes (Signed)
Site area: right groin  Site Prior to Removal:  Level 0  Pressure Applied For 25 MINUTES    Minutes Beginning at 2252  Manual:   Yes.    Patient Status During Pull: WNL  Post Pull Groin Site:  Level 0  Post Pull Instructions Given:  Yes.    Post Pull Pulses Present:  Yes.    Dressing Applied:  Yes.    Comments:  Pt.tolerated procedure well

## 2018-07-31 NOTE — H&P (View-Only) (Signed)
Progress Note  Patient Name: Jeffrey [name] Date of Encounter: 07/31/2018  Primary Cardiologist: [name], MD   Subjective   No CP or dyspnea  Inpatient Medications    Scheduled Meds: . aspirin  324 mg Oral Once  . aspirin EC  81 mg Oral Daily  . atorvastatin  80 mg Oral Daily  . azithromycin  500 mg Oral Daily  . Chlorhexidine Gluconate Cloth  6 each Topical Q0600  . cinacalcet  30 mg Oral Q supper  . clopidogrel  75 mg Oral Daily  . famotidine  20 mg Oral Daily  . insulin aspart  0-15 Units Subcutaneous TID WC  . insulin aspart  0-5 Units Subcutaneous QHS  . insulin detemir  50 Units Subcutaneous QHS  . metoprolol tartrate  25 mg Oral BID  . mometasone-formoterol  2 puff Inhalation BID  . pantoprazole  40 mg Oral Daily  . sevelamer carbonate  2,400 mg Oral TID WC  . sodium chloride flush  3 mL Intravenous Q12H  . sodium chloride flush  3 mL Intravenous Q12H   Continuous Infusions: . sodium chloride    . sodium chloride    . sodium chloride    . heparin 1,900 Units/hr (07/31/18 0102)   PRN Meds: sodium chloride, sodium chloride, acetaminophen **OR** acetaminophen, calcium carbonate (dosed in mg elemental calcium), camphor-menthol **AND** hydrOXYzine, docusate sodium, feeding supplement (NEPRO CARB STEADY), guaiFENesin-dextromethorphan, ipratropium-albuterol, nitroGLYCERIN, ondansetron **OR** ondansetron (ZOFRAN) IV, sodium chloride flush, sodium chloride flush, sorbitol, zolpidem   Vital Signs    Vitals:   07/31/18 0508 07/31/18 0809 07/31/18 0825 07/31/18 1341  BP: 123/60 134/82  (!) 130/94  Pulse: 71 63  (!) 55  Resp: 19 18  18   Temp: (!) 97.5 F (36.4 C) 98.1 F (36.7 C)  (!) 97.4 F (36.3 C)  TempSrc: Oral Oral  Oral  SpO2: 99% 100% 100% 100%  Weight: 108.9 kg     Height:        Intake/Output Summary (Last 24 hours) at 07/31/2018 1415 Last data filed at 07/31/2018 1210 Gross per 24 hour  Intake 3 ml  Output -  Net 3 ml   Filed Weights   07/30/18 0943 07/31/18 0508  Weight: 107 kg 108.9 kg    Telemetry    V Paced with probable underlying atrial fibrillation - Personally Reviewed   Physical Exam   GEN: No acute distress.   Neck: No JVD Cardiac: RRR, 2/6 systolic murmur Respiratory: Clear to auscultation bilaterally. GI: Soft, nontender, non-distended  MS: No edema Neuro:  Nonfocal  Psych: Normal affect   Labs    Chemistry Recent Labs  Lab 07/28/18 0401 07/30/18 0955 07/31/18 0358  NA 131* 136 134*  K 3.5 3.7 4.4  CL 94* 95* 96*  CO2 22 28 23   GLUCOSE 330* 140* 173*  BUN 62* 27* 42*  CREATININE 5.97* 3.35* 4.54*  CALCIUM 8.2* 9.1 8.5*  PROT  --  6.1*  --   ALBUMIN  --  3.0*  --   AST  --  19  --   ALT  --  14  --   ALKPHOS  --  67  --   BILITOT  --  1.1  --   GFRNONAA 8* 16* 11*  GFRAA 9* 19* 13*  ANIONGAP 15 13 15      Hematology Recent Labs  Lab 07/28/18 0846 07/30/18 0946 07/31/18 0358  WBC 9.6 9.8 10.1  RBC 2.90* 3.02* 3.08*  HGB 8.5* 8.9* 9.0*  HCT 26.3* 27.9* 28.5*  MCV 90.7 92.4 92.5  MCH 29.3 29.5 29.2  MCHC 32.3 31.9 31.6  RDW 14.6 15.3 15.3  PLT 226 233 224    Cardiac Enzymes Recent Labs  Lab 07/27/18 0708 07/30/18 1722 07/30/18 2145 07/31/18 0358  TROPONINI 0.23* 2.75* 3.37* 2.63*    Recent Labs  Lab 07/26/18 2354 07/30/18 1009  TROPIPOC 0.21* 1.28*     BNP Recent Labs  Lab 07/27/18 0129 07/30/18 0955  BNP 2,421.8* 3,146.0*      Radiology    Dg Chest Port 1 View  Result Date: 07/30/2018 CLINICAL DATA:  Increasing shortness of breath and chest pain today. EXAM: PORTABLE CHEST 1 VIEW COMPARISON:  Radiographs dated 07/27/2018 and 06/23/2018 FINDINGS: Stable cardiomegaly.  Pacemaker in place.  Aortic atherosclerosis. Interstitial markings are slightly accentuated due to the shallow inspiration. No acute infiltrates or effusions. No acute bone abnormality. IMPRESSION: No acute abnormality. Stable cardiomegaly. Accentuated markings due to a shallow  inspiration. Electronically Signed   By: Lorriane Shire M.D.   On: 07/30/2018 11:41    Patient Profile     77 year old male with a past medical history of coronary artery disease, complete heart block status post pacemaker, end-stage renal disease dialysis dependent, diabetes mellitus, history of aortic stenosis status post TAVR, paroxysmal atrial fibrillation not on anticoagulation with non-ST elevation myocardial infarction.    Assessment & Plan     1 non-ST elevation myocardial infarction-no recurrent chest pain.  Continue aspirin, Plavix, statin, beta-blocker and heparin.  Proceed with cardiac catheterization today.    2 end-stage renal disease-followed by nephrology.  3 prior pacemaker  4 prior TAVR  5 paroxysmal atrial fibrillation-plan to continue metoprolol for rate control.  Will need further discussion with Dr. Caryl Comes following discharge concerning anticoagulation.  Apparently not on Coumadin given history of rash.  Not on DOACs given ESRD.   For questions or updates, please contact [place] Please consult [email or web address] for contact info under Cardiology/STEMI.      Signed, Kirk Ruths, MD  07/31/2018, 2:15 PM

## 2018-07-31 NOTE — Progress Notes (Signed)
Progress Note  Patient Name: Jeffrey [name] Date of Encounter: 07/31/2018  Primary Cardiologist: [name], MD   Subjective   No CP or dyspnea  Inpatient Medications    Scheduled Meds: . aspirin  324 mg Oral Once  . aspirin EC  81 mg Oral Daily  . atorvastatin  80 mg Oral Daily  . azithromycin  500 mg Oral Daily  . Chlorhexidine Gluconate Cloth  6 each Topical Q0600  . cinacalcet  30 mg Oral Q supper  . clopidogrel  75 mg Oral Daily  . famotidine  20 mg Oral Daily  . insulin aspart  0-15 Units Subcutaneous TID WC  . insulin aspart  0-5 Units Subcutaneous QHS  . insulin detemir  50 Units Subcutaneous QHS  . metoprolol tartrate  25 mg Oral BID  . mometasone-formoterol  2 puff Inhalation BID  . pantoprazole  40 mg Oral Daily  . sevelamer carbonate  2,400 mg Oral TID WC  . sodium chloride flush  3 mL Intravenous Q12H  . sodium chloride flush  3 mL Intravenous Q12H   Continuous Infusions: . sodium chloride    . sodium chloride    . sodium chloride    . heparin 1,900 Units/hr (07/31/18 0102)   PRN Meds: sodium chloride, sodium chloride, acetaminophen **OR** acetaminophen, calcium carbonate (dosed in mg elemental calcium), camphor-menthol **AND** hydrOXYzine, docusate sodium, feeding supplement (NEPRO CARB STEADY), guaiFENesin-dextromethorphan, ipratropium-albuterol, nitroGLYCERIN, ondansetron **OR** ondansetron (ZOFRAN) IV, sodium chloride flush, sodium chloride flush, sorbitol, zolpidem   Vital Signs    Vitals:   07/31/18 0508 07/31/18 0809 07/31/18 0825 07/31/18 1341  BP: 123/60 134/82  (!) 130/94  Pulse: 71 63  (!) 55  Resp: 19 18  18   Temp: (!) 97.5 F (36.4 C) 98.1 F (36.7 C)  (!) 97.4 F (36.3 C)  TempSrc: Oral Oral  Oral  SpO2: 99% 100% 100% 100%  Weight: 108.9 kg     Height:        Intake/Output Summary (Last 24 hours) at 07/31/2018 1415 Last data filed at 07/31/2018 1210 Gross per 24 hour  Intake 3 ml  Output -  Net 3 ml   Filed Weights   07/30/18 0943 07/31/18 0508  Weight: 107 kg 108.9 kg    Telemetry    V Paced with probable underlying atrial fibrillation - Personally Reviewed   Physical Exam   GEN: No acute distress.   Neck: No JVD Cardiac: RRR, 2/6 systolic murmur Respiratory: Clear to auscultation bilaterally. GI: Soft, nontender, non-distended  MS: No edema Neuro:  Nonfocal  Psych: Normal affect   Labs    Chemistry Recent Labs  Lab 07/28/18 0401 07/30/18 0955 07/31/18 0358  NA 131* 136 134*  K 3.5 3.7 4.4  CL 94* 95* 96*  CO2 22 28 23   GLUCOSE 330* 140* 173*  BUN 62* 27* 42*  CREATININE 5.97* 3.35* 4.54*  CALCIUM 8.2* 9.1 8.5*  PROT  --  6.1*  --   ALBUMIN  --  3.0*  --   AST  --  19  --   ALT  --  14  --   ALKPHOS  --  67  --   BILITOT  --  1.1  --   GFRNONAA 8* 16* 11*  GFRAA 9* 19* 13*  ANIONGAP 15 13 15      Hematology Recent Labs  Lab 07/28/18 0846 07/30/18 0946 07/31/18 0358  WBC 9.6 9.8 10.1  RBC 2.90* 3.02* 3.08*  HGB 8.5* 8.9* 9.0*  HCT 26.3* 27.9* 28.5*  MCV 90.7 92.4 92.5  MCH 29.3 29.5 29.2  MCHC 32.3 31.9 31.6  RDW 14.6 15.3 15.3  PLT 226 233 224    Cardiac Enzymes Recent Labs  Lab 07/27/18 0708 07/30/18 1722 07/30/18 2145 07/31/18 0358  TROPONINI 0.23* 2.75* 3.37* 2.63*    Recent Labs  Lab 07/26/18 2354 07/30/18 1009  TROPIPOC 0.21* 1.28*     BNP Recent Labs  Lab 07/27/18 0129 07/30/18 0955  BNP 2,421.8* 3,146.0*      Radiology    Dg Chest Port 1 View  Result Date: 07/30/2018 CLINICAL DATA:  Increasing shortness of breath and chest pain today. EXAM: PORTABLE CHEST 1 VIEW COMPARISON:  Radiographs dated 07/27/2018 and 06/23/2018 FINDINGS: Stable cardiomegaly.  Pacemaker in place.  Aortic atherosclerosis. Interstitial markings are slightly accentuated due to the shallow inspiration. No acute infiltrates or effusions. No acute bone abnormality. IMPRESSION: No acute abnormality. Stable cardiomegaly. Accentuated markings due to a shallow  inspiration. Electronically Signed   By: Lorriane Shire M.D.   On: 07/30/2018 11:41    Patient Profile     77 year old male with a past medical history of coronary artery disease, complete heart block status post pacemaker, end-stage renal disease dialysis dependent, diabetes mellitus, history of aortic stenosis status post TAVR, paroxysmal atrial fibrillation not on anticoagulation with non-ST elevation myocardial infarction.    Assessment & Plan     1 non-ST elevation myocardial infarction-no recurrent chest pain.  Continue aspirin, Plavix, statin, beta-blocker and heparin.  Proceed with cardiac catheterization today.    2 end-stage renal disease-followed by nephrology.  3 prior pacemaker  4 prior TAVR  5 paroxysmal atrial fibrillation-plan to continue metoprolol for rate control.  Will need further discussion with Dr. Caryl Comes following discharge concerning anticoagulation.  Apparently not on Coumadin given history of rash.  Not on DOACs given ESRD.   For questions or updates, please contact [place] Please consult [email or web address] for contact info under Cardiology/STEMI.      Signed, Kirk Ruths, MD  07/31/2018, 2:15 PM

## 2018-07-31 NOTE — Interval H&P Note (Signed)
History and Physical Interval Note:  07/31/2018 2:22 PM  Jeffrey [name]  has presented today for surgery, with the diagnosis of cp  The various methods of treatment have been discussed with the patient and family. After consideration of risks, benefits and other options for treatment, the patient has consented to  Procedure(s): LEFT HEART CATH AND CORONARY ANGIOGRAPHY (N/A) as a surgical intervention .  The patient's history has been reviewed, patient examined, no change in status, stable for surgery.  I have reviewed the patient's chart and labs.  Questions were answered to the patient's satisfaction.   Cath Lab Visit (complete for each Cath Lab visit)  Clinical Evaluation Leading to the Procedure:   ACS: Yes.    Non-ACS:    Anginal Classification: CCS IV  Anti-ischemic medical therapy: Maximal Therapy (2 or more classes of medications)  Non-Invasive Test Results: No non-invasive testing performed  Prior CABG: No previous CABG        [name] 07/31/2018 2:22 PM

## 2018-08-01 ENCOUNTER — Encounter (HOSPITAL_COMMUNITY): Payer: Self-pay | Admitting: Cardiology

## 2018-08-01 DIAGNOSIS — I5042 Chronic combined systolic (congestive) and diastolic (congestive) heart failure: Secondary | ICD-10-CM

## 2018-08-01 LAB — BASIC METABOLIC PANEL
Anion gap: 14 (ref 5–15)
BUN: 58 mg/dL — AB (ref 8–23)
CHLORIDE: 95 mmol/L — AB (ref 98–111)
CO2: 24 mmol/L (ref 22–32)
Calcium: 8.2 mg/dL — ABNORMAL LOW (ref 8.9–10.3)
Creatinine, Ser: 5.73 mg/dL — ABNORMAL HIGH (ref 0.61–1.24)
GFR calc Af Amer: 10 mL/min — ABNORMAL LOW (ref >60)
GFR calc non Af Amer: 8 mL/min — ABNORMAL LOW (ref >60)
Glucose, Bld: 122 mg/dL — ABNORMAL HIGH (ref 70–99)
POTASSIUM: 4.4 mmol/L (ref 3.5–5.1)
SODIUM: 133 mmol/L — AB (ref 135–145)

## 2018-08-01 LAB — CBC
HCT: 25.9 % — ABNORMAL LOW (ref 39.0–52.0)
Hemoglobin: 8.3 g/dL — ABNORMAL LOW (ref 13.0–17.0)
MCH: 29.4 pg (ref 26.0–34.0)
MCHC: 32 g/dL (ref 30.0–36.0)
MCV: 91.8 fL (ref 78.0–100.0)
PLATELETS: 243 10*3/uL (ref 150–400)
RBC: 2.82 MIL/uL — ABNORMAL LOW (ref 4.22–5.81)
RDW: 15.5 % (ref 11.5–15.5)
WBC: 9.4 10*3/uL (ref 4.0–10.5)

## 2018-08-01 LAB — GLUCOSE, CAPILLARY
GLUCOSE-CAPILLARY: 102 mg/dL — AB (ref 70–99)
GLUCOSE-CAPILLARY: 231 mg/dL — AB (ref 70–99)
Glucose-Capillary: 127 mg/dL — ABNORMAL HIGH (ref 70–99)

## 2018-08-01 MED ORDER — PANTOPRAZOLE SODIUM 40 MG PO TBEC: 40.0000 mg | DELAYED_RELEASE_TABLET | Freq: Every day | ORAL | 1 refills | Status: DC

## 2018-08-01 NOTE — Discharge Summary (Signed)
Physician Discharge Summary  Jeffrey [name] JKK:[id number] DOB: 03-04-1941 DOA: 07/30/2018  PCP: Thurman Coyer, MD  Admit date: 07/30/2018 Discharge date: 08/01/2018  Time spent: 45* minutes  Recommendations for Outpatient Follow-up:  1. Follow up Cardiology in one week   Discharge Diagnoses:  Principal Problem:   NSTEMI (non-ST elevated myocardial infarction) ([place]) Active Problems:   Type 2 diabetes mellitus with chronic kidney disease on chronic dialysis, with long-term current use of insulin (HCC)   Essential hypertension   ESRD (end stage renal disease) on dialysis (HCC)   Chronic combined systolic and diastolic CHF (congestive heart failure) ([place])   Hyperlipidemia   COPD with acute exacerbation (HCC)   Atrial fibrillation ([place]) [I48.91]   Discharge Condition: Stable  Diet recommendation: Heart healthy  Filed Weights   07/31/18 0508 08/01/18 0300 08/01/18 0757  Weight: 108.9 kg 110 kg 109.8 kg    History of present illness:  77 year old male with a history of diabetes mellitus, CAD, hypertension, pacemaker placement, ESRD, Monday Wednesday Friday hemodialysis, CVA, COPD, severe aortic stenosis status post TAVR, atrial fibrillation not on anticoagulation came to hospital with chest pain. Patient was seen by cardiology, plan for left heart cath.   Hospital Course:  1. Acute coronary syndrome-non-ST elevation MI, troponin was elevated to 2.63, patient started on heparin protocol, aspirin, Plavix, statin, beta-blocker.    Patient underwent cardiac cath, PTCA of circumflex.  Cardiology has signed off.  We will follow-up in the clinic in 1 week.  We will continue home medications.  2. Hypertension-blood pressure stable, continue Lopressor  3. Hyperlipidemia-continue Lipitor  4. Diabetes mellitus type 2-continue Levemir  5. COPD-not in exacerbation, continue home O2  6. ESRD-patient on hemodialysis  7. Atrial fibrillation-heart rate is controlled,CHA2DS2VASc score  is 8, not on Coumadin due to rash and not on NOAC due to ESRD.  Cardiology recommends to continue with aspirin and Plavix for 1 month and then after stopping aspirin consider Coumadin.  Procedures:  Left heart catheterization  Consultations:  Cardiology  Nephrology  Discharge Exam: Vitals:   08/01/18 1200 08/01/18 1207  BP: (!) 153/106   Pulse: (!) 54 (!) 58  Resp: 20 19  Temp:  98.5 F (36.9 C)  SpO2:      General: Appears in no acute distress Cardiovascular: S1-S2, regular Respiratory: Clear bilaterally  Discharge Instructions   Discharge Instructions    Diet - low sodium heart healthy   Complete by:  As directed    Increase activity slowly   Complete by:  As directed      Allergies as of 08/01/2018      Reactions   Byetta 10 Mcg Pen [exenatide] Diarrhea, Nausea And Vomiting   Codeine Itching   Coumadin [warfarin Sodium] Rash      Medication List    STOP taking these medications   azithromycin 500 MG tablet Commonly known as:  ZITHROMAX   omeprazole 20 MG capsule Commonly known as:  PRILOSEC Replaced by:  pantoprazole 40 MG tablet     TAKE these medications   albuterol 108 (90 Base) MCG/ACT inhaler Commonly known as:  PROVENTIL HFA;VENTOLIN HFA Inhale 2 puffs into the lungs every 6 (six) hours as needed for wheezing or shortness of breath.   aspirin EC 81 MG tablet Take 1 tablet (81 mg total) by mouth daily.   atorvastatin 40 MG tablet Commonly known as:  LIPITOR Take 2 tablets (80 mg total) by mouth daily.   cinacalcet 30 MG tablet Commonly known as:  SENSIPAR  Take 30 mg by mouth daily with supper.   clopidogrel 75 MG tablet Commonly known as:  PLAVIX Take 1 tablet (75 mg total) by mouth daily.   Fluticasone-Salmeterol 250-50 MCG/DOSE Aepb Commonly known as:  ADVAIR Inhale 1 puff into the lungs 2 (two) times daily.   guaiFENesin-dextromethorphan 100-10 MG/5ML syrup Commonly known as:  ROBITUSSIN DM Take 5 mLs by mouth every 6 (six)  hours as needed for cough.   insulin detemir 100 UNIT/ML injection Commonly known as:  LEVEMIR Inject 0.5 mLs (50 Units total) into the skin at bedtime.   ipratropium-albuterol 0.5-2.5 (3) MG/3ML Soln Commonly known as:  DUONEB Take 3 mLs by nebulization every 6 (six) hours as needed.   lidocaine-prilocaine cream Commonly known as:  EMLA Apply 1 application topically once as needed (prior to accessing port).   metoprolol tartrate 25 MG tablet Commonly known as:  LOPRESSOR Take 1 tablet (25 mg total) by mouth 2 (two) times daily.   nitroGLYCERIN 0.4 MG SL tablet Commonly known as:  NITROSTAT Place 1 tablet (0.4 mg total) under the tongue every 5 (five) minutes as needed for chest pain.   pantoprazole 40 MG tablet Commonly known as:  PROTONIX Take 1 tablet (40 mg total) by mouth daily. Replaces:  omeprazole 20 MG capsule   ranitidine 150 MG tablet Commonly known as:  ZANTAC Take 150 mg by mouth 2 (two) times daily as needed for heartburn.   sevelamer carbonate 800 MG tablet Commonly known as:  RENVELA Take 3 tablets (2,400 mg total) by mouth 3 (three) times daily with meals.   triamcinolone 0.1 % cream : eucerin Crea Apply 1 application topically 3 (three) times daily as needed. Apply to arms, back, and other areas with active rash/itching      Allergies  Allergen Reactions  . Byetta 10 Mcg Pen [Exenatide] Diarrhea and Nausea And Vomiting  . Codeine Itching  . Coumadin [Warfarin Sodium] Rash      The results of significant diagnostics from this hospitalization (including imaging, microbiology, ancillary and laboratory) are listed below for reference.    Significant Diagnostic Studies: Dg Chest 2 View  Result Date: 07/27/2018 CLINICAL DATA:  Shortness of breath and LEFT chest pain beginning at 6 p.m. History of CHF, COPD, TAVR. EXAM: CHEST - 2 VIEW COMPARISON:  Chest radiograph June 23, 2018 FINDINGS: Moderate cardiomegaly similar to prior examination. Status post  aortic valve replacement. Calcified aortic arch. Mild pulmonary vascular congestion and increasing interstitial prominence. Patchy retrocardiac consolidation. Small pleural effusions. Flattened hemidiaphragms. RIGHT cardiac pacemaker in situ. Calcifications in LEFT neck are likely vascular. Surgical clips in the included right abdomen compatible with cholecystectomy. IMPRESSION: 1. Stable cardiomegaly. Increasing interstitial prominence most compatible with pulmonary edema. Retrocardiac consolidation. Small pleural effusions. 2. Underlying suspected COPD. 3.  Aortic Atherosclerosis (ICD10-I70.0). Electronically Signed   By: Elon Alas M.D.   On: 07/27/2018 00:42   Dg Chest Port 1 View  Result Date: 07/30/2018 CLINICAL DATA:  Increasing shortness of breath and chest pain today. EXAM: PORTABLE CHEST 1 VIEW COMPARISON:  Radiographs dated 07/27/2018 and 06/23/2018 FINDINGS: Stable cardiomegaly.  Pacemaker in place.  Aortic atherosclerosis. Interstitial markings are slightly accentuated due to the shallow inspiration. No acute infiltrates or effusions. No acute bone abnormality. IMPRESSION: No acute abnormality. Stable cardiomegaly. Accentuated markings due to a shallow inspiration. Electronically Signed   By: Lorriane Shire M.D.   On: 07/30/2018 11:41    Microbiology: Recent Results (from the past 240 hour(s))  MRSA PCR Screening  Status: None   Collection Time: 07/27/18  3:27 AM  Result Value Ref Range Status   MRSA by PCR NEGATIVE NEGATIVE Final    Comment:        The GeneXpert MRSA Assay (FDA approved for NASAL specimens only), is one component of a comprehensive MRSA colonization surveillance program. It is not intended to diagnose MRSA infection nor to guide or monitor treatment for MRSA infections. Performed at [facility], [place] [address] [place], [place] [address]      Labs: Basic Metabolic Panel: Recent Labs  Lab 07/27/18 0708 07/28/18 0401 07/30/18 0955  07/31/18 0358 08/01/18 0301  NA 132* 131* 136 134* 133*  K 3.8 3.5 3.7 4.4 4.4  CL 97* 94* 95* 96* 95*  CO2 21* 22 28 23 24   GLUCOSE 321* 330* 140* 173* 122*  BUN 49* 62* 27* 42* 58*  CREATININE 5.40* 5.97* 3.35* 4.54* 5.73*  CALCIUM 8.4* 8.2* 9.1 8.5* 8.2*   Liver Function Tests: Recent Labs  Lab 07/30/18 0955  AST 19  ALT 14  ALKPHOS 67  BILITOT 1.1  PROT 6.1*  ALBUMIN 3.0*   CBC: Recent Labs  Lab 07/27/18 0708 07/28/18 0846 07/30/18 0946 07/31/18 0358 08/01/18 0301  WBC 7.2 9.6 9.8 10.1 9.4  NEUTROABS 6.5  --   --   --   --   HGB 9.5* 8.5* 8.9* 9.0* 8.3*  HCT 29.7* 26.3* 27.9* 28.5* 25.9*  MCV 93.4 90.7 92.4 92.5 91.8  PLT 187 226 233 224 243   Cardiac Enzymes: Recent Labs  Lab 07/27/18 0206 07/27/18 0708 07/30/18 1722 07/30/18 2145 07/31/18 0358  TROPONINI 0.32* 0.23* 2.75* 3.37* 2.63*   BNP: BNP (last 3 results) Recent Labs    11/14/17 1238 07/27/18 0129 07/30/18 0955  BNP 159.6* 2,421.8* 3,146.0*    ProBNP (last 3 results) No results for input(s): PROBNP in the last 8760 hours.  CBG: Recent Labs  Lab 07/31/18 1358 07/31/18 1738 07/31/18 2202 08/01/18 0106 08/01/18 0641  GLUCAP 206* 185* 75 102* 231*       Signed:  Oswald Hillock MD.  [facility] 08/01/2018, 12:18 PM

## 2018-08-01 NOTE — Progress Notes (Signed)
Progress Note  Patient Name: Jeffrey [name] Date of Encounter: 08/01/2018  Primary Cardiologist: [name], MD   Subjective   Denies CP or dyspnea  Inpatient Medications    Scheduled Meds: . aspirin  324 mg Oral Once  . aspirin EC  81 mg Oral Daily  . atorvastatin  80 mg Oral Daily  . azithromycin  500 mg Oral Daily  . Chlorhexidine Gluconate Cloth  6 each Topical Q0600  . cinacalcet  30 mg Oral Q supper  . clopidogrel  75 mg Oral Daily  . famotidine  20 mg Oral Daily  . heparin  5,000 Units Subcutaneous Q8H  . insulin aspart  0-15 Units Subcutaneous TID WC  . insulin aspart  0-5 Units Subcutaneous QHS  . insulin detemir  50 Units Subcutaneous QHS  . metoprolol tartrate  25 mg Oral BID  . mometasone-formoterol  2 puff Inhalation BID  . pantoprazole  40 mg Oral Daily  . sevelamer carbonate  2,400 mg Oral TID WC  . sodium chloride flush  3 mL Intravenous Q12H  . sodium chloride flush  3 mL Intravenous Q12H   Continuous Infusions: . sodium chloride    . sodium chloride     PRN Meds: sodium chloride, sodium chloride, acetaminophen **OR** acetaminophen, calcium carbonate (dosed in mg elemental calcium), camphor-menthol **AND** hydrOXYzine, docusate sodium, feeding supplement (NEPRO CARB STEADY), guaiFENesin-dextromethorphan, ipratropium-albuterol, nitroGLYCERIN, ondansetron **OR** ondansetron (ZOFRAN) IV, sodium chloride flush, sodium chloride flush, sorbitol, zolpidem   Vital Signs    Vitals:   08/01/18 0400 08/01/18 0500 08/01/18 0719 08/01/18 0725  BP: (!) 96/30 111/62  (!) 180/154  Pulse: (!) 54 (!) 54 (!) 55   Resp: 20 (!) 26 (!) 28   Temp:   98.1 F (36.7 C)   TempSrc:   Oral   SpO2: 97% 95% 96%   Weight:      Height:        Intake/Output Summary (Last 24 hours) at 08/01/2018 0842 Last data filed at 08/01/2018 0325 Gross per 24 hour  Intake 483 ml  Output 200 ml  Net 283 ml   Filed Weights   07/30/18 0943 07/31/18 0508 08/01/18 0300  Weight: 107 kg  108.9 kg 110 kg   Telemetry personally reviewed.  Patient has ventricular pacing with underlying atrial fibrillation.  Physical Exam   GEN: No acute distress.  WD/WN Neck: No JVD, supple Cardiac: RRR Respiratory: Clear to auscultation bilaterally; no wheeze. GI: Soft, NT/ND MS: No edema Neuro:  Grossly intact   Labs    Chemistry Recent Labs  Lab 07/30/18 0955 07/31/18 0358 08/01/18 0301  NA 136 134* 133*  K 3.7 4.4 4.4  CL 95* 96* 95*  CO2 28 23 24   GLUCOSE 140* 173* 122*  BUN 27* 42* 58*  CREATININE 3.35* 4.54* 5.73*  CALCIUM 9.1 8.5* 8.2*  PROT 6.1*  --   --   ALBUMIN 3.0*  --   --   AST 19  --   --   ALT 14  --   --   ALKPHOS 67  --   --   BILITOT 1.1  --   --   GFRNONAA 16* 11* 8*  GFRAA 19* 13* 10*  ANIONGAP 13 15 14      Hematology Recent Labs  Lab 07/30/18 0946 07/31/18 0358 08/01/18 0301  WBC 9.8 10.1 9.4  RBC 3.02* 3.08* 2.82*  HGB 8.9* 9.0* 8.3*  HCT 27.9* 28.5* 25.9*  MCV 92.4 92.5 91.8  MCH 29.5  29.2 29.4  MCHC 31.9 31.6 32.0  RDW 15.3 15.3 15.5  PLT 233 224 243    Cardiac Enzymes Recent Labs  Lab 07/27/18 0708 07/30/18 1722 07/30/18 2145 07/31/18 0358  TROPONINI 0.23* 2.75* 3.37* 2.63*    Recent Labs  Lab 07/26/18 2354 07/30/18 1009  TROPIPOC 0.21* 1.28*     BNP Recent Labs  Lab 07/27/18 0129 07/30/18 0955  BNP 2,421.8* 3,146.0*      Radiology    Dg Chest Port 1 View  Result Date: 07/30/2018 CLINICAL DATA:  Increasing shortness of breath and chest pain today. EXAM: PORTABLE CHEST 1 VIEW COMPARISON:  Radiographs dated 07/27/2018 and 06/23/2018 FINDINGS: Stable cardiomegaly.  Pacemaker in place.  Aortic atherosclerosis. Interstitial markings are slightly accentuated due to the shallow inspiration. No acute infiltrates or effusions. No acute bone abnormality. IMPRESSION: No acute abnormality. Stable cardiomegaly. Accentuated markings due to a shallow inspiration. Electronically Signed   By: Lorriane Shire M.D.   On:  07/30/2018 11:41    Patient Profile     77 year old male with a past medical history of coronary artery disease, complete heart block status post pacemaker, end-stage renal disease dialysis dependent, diabetes mellitus, history of aortic stenosis status post TAVR, paroxysmal atrial fibrillation not on anticoagulation with non-ST elevation myocardial infarction.    Assessment & Plan     1 non-ST elevation myocardial infarction-patient underwent PTCA of circumflex yesterday.  Plan to continue aspirin, Plavix, beta-blocker and statin.    2 end-stage renal disease-dialysis today.  3 prior pacemaker  4 prior TAVR  5 paroxysmal atrial fibrillation-as outlined previously plan continue metoprolol for rate control.  Patient is in atrial fibrillation at present.  He will need follow-up with Dr. Caryl Comes after discharge for discussion of anticoagulation.  He apparently had a rash with Coumadin previously and not a candidate for DOACs due to ESRD.  I would also be hesitant to add Coumadin for now given need for aspirin and Plavix for 1 month.  However we could discontinue aspirin 1 month from now, continue Plavix and begin Coumadin at that time. CHADSvasc 7.  [facility] will sign off.   Medication Recommendations: Continue present medications and MAR. Other recommendations (labs, testing, etc): No further cardiac testing. Follow up as an outpatient: Transition of care appointment with APP 1 week.  Follow-up Dr. Caryl Comes 3 months.  For questions or updates, please contact [place] Please consult [email or web address] for contact info under Cardiology/STEMI.      Signed, Kirk Ruths, MD  08/01/2018, 8:42 AM

## 2018-08-01 NOTE — Progress Notes (Addendum)
Progress Note  Patient Name: Jeffrey [name] Date of Encounter: 08/01/2018  Primary Cardiologist: [name], MD   Subjective   No CP, still very SOB w/ exertion, has to wear O2 24/7, was not using it all the time at home.  Inpatient Medications    Scheduled Meds: . aspirin  324 mg Oral Once  . aspirin EC  81 mg Oral Daily  . atorvastatin  80 mg Oral Daily  . azithromycin  500 mg Oral Daily  . Chlorhexidine Gluconate Cloth  6 each Topical Q0600  . cinacalcet  30 mg Oral Q supper  . clopidogrel  75 mg Oral Daily  . famotidine  20 mg Oral Daily  . heparin  5,000 Units Subcutaneous Q8H  . insulin aspart  0-15 Units Subcutaneous TID WC  . insulin aspart  0-5 Units Subcutaneous QHS  . insulin detemir  50 Units Subcutaneous QHS  . metoprolol tartrate  25 mg Oral BID  . mometasone-formoterol  2 puff Inhalation BID  . pantoprazole  40 mg Oral Daily  . sevelamer carbonate  2,400 mg Oral TID WC  . sodium chloride flush  3 mL Intravenous Q12H  . sodium chloride flush  3 mL Intravenous Q12H   Continuous Infusions: . sodium chloride    . sodium chloride     PRN Meds: sodium chloride, sodium chloride, acetaminophen **OR** acetaminophen, calcium carbonate (dosed in mg elemental calcium), camphor-menthol **AND** hydrOXYzine, docusate sodium, feeding supplement (NEPRO CARB STEADY), guaiFENesin-dextromethorphan, ipratropium-albuterol, nitroGLYCERIN, ondansetron **OR** ondansetron (ZOFRAN) IV, sodium chloride flush, sodium chloride flush, sorbitol, zolpidem   Vital Signs    Vitals:   08/01/18 0400 08/01/18 0500 08/01/18 0719 08/01/18 0725  BP: (!) 96/30 111/62  (!) 180/154  Pulse: (!) 54 (!) 54 (!) 55   Resp: 20 (!) 26 (!) 28   Temp:   98.1 F (36.7 C)   TempSrc:   Oral   SpO2: 97% 95% 96%   Weight:      Height:        Intake/Output Summary (Last 24 hours) at 08/01/2018 0854 Last data filed at 08/01/2018 0325 Gross per 24 hour  Intake 483 ml  Output 200 ml  Net 283 ml    Filed Weights   07/30/18 0943 07/31/18 0508 08/01/18 0300  Weight: 107 kg 108.9 kg 110 kg    Telemetry and CARDIAC CATH    09/05 Atypical atrial flutter, V pacing - Personally Reviewed  CARDIAC CATH: 07/31/2018  Ost 2nd Diag lesion is 70% stenosed.  Previously placed Prox LAD-2 drug eluting stent is widely patent.  Previously placed Prox LAD-1 drug eluting stent is widely patent.  Prox Cx to Mid Cx lesion is 30% stenosed.  Prox RCA to Dist RCA lesion is 100% stenosed.  Ost Cx lesion is 95% stenosed.  Post intervention, there is a 10% residual stenosis.  Scoring balloon angioplasty was performed using a BALLOON [place] 4.00X10.  Balloon angioplasty was performed using a BALLOON [place] EMERGE MR 4.5X12.  1. Severe 2 vessel obstructive CAD    - 95% in stent restenosis of the ostial LCx    - 100% CTO of the RCA 2. Stent in the proximal to mid LAD is patent 3. Successful scoring balloon/PTCA of the ostial LCx Recommend uninterrupted dual antiplatelet therapy with Aspirin 81mg  daily and Clopidogrel 75mg  daily for a minimum of 12 months (ACS - Class I recommendation).   Physical Exam   General: Well developed, well nourished, male in no acute distress Head: Eyes  PERRLA, No xanthomas.   Normocephalic and atraumatic Lungs: Clear bilaterally to auscultation w/ few rales bases. Heart: HRRR S1 S2, without RG. 2/6 SEM. No JVD seen, difficult to assess 2nd body habitus. Abdomen: Bowel sounds are present, abdomen soft and non-tender without masses or  hernias noted. Msk: Normal strength and tone for age. Extremities: No clubbing, cyanosis or edema. R groin cath site w/out ecchymosis or hematoma  Skin:  No rashes or lesions noted. Neuro: Alert and oriented X 3. Psych:  Good affect, responds appropriately  Labs    Chemistry Recent Labs  Lab 07/30/18 0955 07/31/18 0358 08/01/18 0301  NA 136 134* 133*  K 3.7 4.4 4.4  CL 95* 96* 95*  CO2 28 23 24   GLUCOSE 140* 173* 122*   BUN 27* 42* 58*  CREATININE 3.35* 4.54* 5.73*  CALCIUM 9.1 8.5* 8.2*  PROT 6.1*  --   --   ALBUMIN 3.0*  --   --   AST 19  --   --   ALT 14  --   --   ALKPHOS 67  --   --   BILITOT 1.1  --   --   GFRNONAA 16* 11* 8*  GFRAA 19* 13* 10*  ANIONGAP 13 15 14      Hematology Recent Labs  Lab 07/30/18 0946 07/31/18 0358 08/01/18 0301  WBC 9.8 10.1 9.4  RBC 3.02* 3.08* 2.82*  HGB 8.9* 9.0* 8.3*  HCT 27.9* 28.5* 25.9*  MCV 92.4 92.5 91.8  MCH 29.5 29.2 29.4  MCHC 31.9 31.6 32.0  RDW 15.3 15.3 15.5  PLT 233 224 243    Cardiac Enzymes Recent Labs  Lab 07/27/18 0708 07/30/18 1722 07/30/18 2145 07/31/18 0358  TROPONINI 0.23* 2.75* 3.37* 2.63*    Recent Labs  Lab 07/26/18 2354 07/30/18 1009  TROPIPOC 0.21* 1.28*     BNP Recent Labs  Lab 07/27/18 0129 07/30/18 0955  BNP 2,421.8* 3,146.0*      Radiology    Dg Chest Port 1 View  Result Date: 07/30/2018 CLINICAL DATA:  Increasing shortness of breath and chest pain today. EXAM: PORTABLE CHEST 1 VIEW COMPARISON:  Radiographs dated 07/27/2018 and 06/23/2018 FINDINGS: Stable cardiomegaly.  Pacemaker in place.  Aortic atherosclerosis. Interstitial markings are slightly accentuated due to the shallow inspiration. No acute infiltrates or effusions. No acute bone abnormality. IMPRESSION: No acute abnormality. Stable cardiomegaly. Accentuated markings due to a shallow inspiration. Electronically Signed   By: Lorriane Shire M.D.   On: 07/30/2018 11:41    Patient Profile     77 year old male with a past medical history of coronary artery disease, complete heart block status post pacemaker, end-stage renal disease dialysis dependent, diabetes mellitus, history of aortic stenosis status post TAVR, paroxysmal atrial fibrillation not on anticoagulation with non-ST elevation myocardial infarction.    Assessment & Plan     1 non-ST elevation myocardial infarction- - s/p cath w/ DES oCFX, med rx for 100% RCA - on ASA, Plavix. Ck  P2Y12 - continue BB, high-dose statin  2 end-stage renal disease- - HD today  - per IM/Nephrology  3 prior pacemaker - functioning well  4 prior TAVR - minor SEM  5 paroxysmal atrial fibrillation- - cont Metoprolol 25 mg bid - PPM functioning well. - not anticoagulated, will leave to Dr Caryl Comes - hx Rash w/ coumadin, no DOACs 2nd HD   For questions or updates, please contact [place] Please consult [email or web address] for contact info under Cardiology/STEMI.      Signed, Suanne Marker  Barrett, PA-C  08/01/2018, 8:54 AM   See progress notes [facility]

## 2018-08-01 NOTE — Progress Notes (Signed)
CARDIAC REHAB PHASE I   PRE:  Rate/Rhythm: 56 paced  BP:  Sitting: 92/26      SaO2: 100 3L  MODE:  Ambulation: 100 ft 75 peak HR  POST:  Rate/Rhythm: 55 paced  BP:  Sitting: 114/43    SaO2: 100 3L  Pt ambulated 140ft in hallway assist of 2 with gait belt and front wheel walker. Pt and family educated on importance of Plavix, ASA, statin, and NTG. MI book reviewed. Pt given heart healthy and diabetic diets. Reviewed CHF Booklet. Encouraged pt to be as mobile as able, pt states minimal activity at home, and "doesn't like walking". Repeated and reinforced ed with all family members present in the room. Reviewed restrictions. Will refer to CRP II [place], pt unlikely able to attend due to dialysis.  [id number] Rufina Falco, RN BSN 08/01/2018 2:38 PM

## 2018-08-01 NOTE — Progress Notes (Signed)
[facility] Progress Note  Subjective: no c/o, had PTCA to the ISR 95% lesion of prox LCX yesterday. Doing well today on HD, no CP or new SOB.    Vitals:   08/01/18 0900 08/01/18 0930 08/01/18 1000 08/01/18 1030  BP: (!) 144/69 (!) 149/49 (!) 158/39 101/60  Pulse: (!) 54 (!) 54 (!) 55 (!) 58  Resp: (!) 23 20 19 17   Temp:      TempSrc:      SpO2:      Weight:      Height:        Inpatient medications: . aspirin  324 mg Oral Once  . aspirin EC  81 mg Oral Daily  . atorvastatin  80 mg Oral Daily  . azithromycin  500 mg Oral Daily  . Chlorhexidine Gluconate Cloth  6 each Topical Q0600  . cinacalcet  30 mg Oral Q supper  . clopidogrel  75 mg Oral Daily  . famotidine  20 mg Oral Daily  . heparin  5,000 Units Subcutaneous Q8H  . insulin aspart  0-15 Units Subcutaneous TID WC  . insulin aspart  0-5 Units Subcutaneous QHS  . insulin detemir  50 Units Subcutaneous QHS  . metoprolol tartrate  25 mg Oral BID  . mometasone-formoterol  2 puff Inhalation BID  . pantoprazole  40 mg Oral Daily  . sevelamer carbonate  2,400 mg Oral TID WC  . sodium chloride flush  3 mL Intravenous Q12H  . sodium chloride flush  3 mL Intravenous Q12H   . sodium chloride    . sodium chloride     sodium chloride, sodium chloride, acetaminophen **OR** acetaminophen, calcium carbonate (dosed in mg elemental calcium), camphor-menthol **AND** hydrOXYzine, docusate sodium, feeding supplement (NEPRO CARB STEADY), guaiFENesin-dextromethorphan, ipratropium-albuterol, nitroGLYCERIN, ondansetron **OR** ondansetron (ZOFRAN) IV, sodium chloride flush, sodium chloride flush, sorbitol, zolpidem  Iron/TIBC/Ferritin/ %Sat    Component Value Date/Time   IRON 59 01/12/2015 0730   TIBC 227 01/12/2015 0730   FERRITIN 189 01/12/2015 0730   IRONPCTSAT 26 01/12/2015 0730    Exam: General: alert ,elderly obese WM NAD, Calm Heart: RRR, no mur, rub, gal Lungs: occ rhonchi, mostly clear non labored breathing    Abdomen: obese, soft, NT, ND Extremities: Trace pretibial;l edema  Dialysis Access: Pos bruit   Neuro- alert OX3 ,moves all extrem. Appropriate     OP Dialysis:mwf nw 4h 105k/   104.5 kg  2K/ 3.5Ca Hep none LUA AVF Hec 57mcg IV/HD Mircera 60 mcg q 2 wks, last given 07/21/18 (last op hgb 10.5 (8/28) Venofer 50mg  weely  Problems/Plan: 1.  ESRD - HD MWF.  HD today 2. Chest pain / +trop - +PTCA 9/5 to LCX ostial lesion.  3. HTN/volume / ICM  EF 40-50% -  cont Lopressor 25mg  bid. UF 3-4L today 4. 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 Secondary hyperparathyroidism -  hec on hd , binder with meals Renvela 3,and sensipar 30 mg q day  8. DM type 2- per admit  9. Nutrition  - Rena/ ,carb diet,renal vitamin    Kelly Splinter MD [facility] pager [phone]   08/01/2018, 11:27 AM   Recent Labs  Lab 07/30/18 0955 07/31/18 0358 08/01/18 0301  NA 136 134* 133*  K 3.7 4.4 4.4  CL 95* 96* 95*  CO2  28 23 24   GLUCOSE 140* 173* 122*  BUN 27* 42* 58*  CREATININE 3.35* 4.54* 5.73*  CALCIUM 9.1 8.5* 8.2*  ALBUMIN 3.0*  --   --    Recent Labs  Lab 07/30/18 0955  AST 19  ALT 14  ALKPHOS 67  BILITOT 1.1  PROT 6.1*   Recent Labs  Lab 07/27/18 0708  07/31/18 0358 08/01/18 0301  WBC 7.2   < > 10.1 9.4  NEUTROABS 6.5  --   --   --   HGB 9.5*   < > 9.0* 8.3*  HCT 29.7*   < > 28.5* 25.9*  MCV 93.4   < > 92.5 91.8  PLT 187   < > 224 243   < > = values in this interval not displayed.

## 2018-08-05 ENCOUNTER — Encounter (HOSPITAL_COMMUNITY): Payer: Self-pay | Admitting: Cardiology

## 2018-08-06 ENCOUNTER — Encounter: Payer: Self-pay | Admitting: Internal Medicine

## 2018-08-06 ENCOUNTER — Ambulatory Visit (INDEPENDENT_AMBULATORY_CARE_PROVIDER_SITE_OTHER): Payer: Medicare Other | Admitting: Internal Medicine

## 2018-08-06 VITALS — BP 124/68 | HR 55 | Ht 71.0 in | Wt 234.0 lb

## 2018-08-06 DIAGNOSIS — I442 Atrioventricular block, complete: Secondary | ICD-10-CM | POA: Diagnosis not present

## 2018-08-06 DIAGNOSIS — Z950 Presence of cardiac pacemaker: Secondary | ICD-10-CM | POA: Diagnosis not present

## 2018-08-06 DIAGNOSIS — I5022 Chronic systolic (congestive) heart failure: Secondary | ICD-10-CM | POA: Diagnosis not present

## 2018-08-06 DIAGNOSIS — I4891 Unspecified atrial fibrillation: Secondary | ICD-10-CM

## 2018-08-06 NOTE — Patient Instructions (Signed)
Medication Instructions:  Your physician recommends that you continue on your current medications as directed. Please refer to the Current Medication list given to you today.  Labwork: None ordered.  Testing/Procedures: None ordered.  Follow-Up:  3 months with Tommye Standard, PA.  Your physician wants you to follow-up in: One Year with Dr Caryl Comes. You will receive a reminder letter in the mail two months in advance. If you don't receive a letter, please call our office to schedule the follow-up appointment.  Remote monitoring is used to monitor your Pacemaker of ICD from home. This monitoring reduces the number of office visits required to check your device to one time per year. It allows [place] to keep an eye on the functioning of your device to ensure it is working properly. You are scheduled for a device check from home on 10/30/2018. You may send your transmission at any time that day. If you have a wireless device, the transmission will be sent automatically. After your physician reviews your transmission, you will receive a postcard with your next transmission date.    Any Other Special Instructions Will Be Listed Below (If Applicable).  Dr Caryl Comes will be in contact with Dr Lorrene Reid regarding anticoagulation.   If you need a refill on your cardiac medications before your next appointment, please call your pharmacy.

## 2018-08-06 NOTE — Progress Notes (Signed)
Patient Care Team: [name], Dianna Rossetti, MD as PCP - General (Internal Medicine) Deboraha Sprang, MD as PCP - Cardiology (Cardiology) Fleet Contras, MD as Consulting Physician (Nephrology)   HPI  Jeffrey [name] is a 76 y.o. male Seen in follow-up for Biotronik pacemaker implanted initially for intermittent complete heart block and then complete heart block complicating TAVR-sapien 3  He has known coronary disease..>>>"with coronary calcification and total occlusion of the proximal to mid RCA with extensive left-to-right collaterals; 50 - 60% stenosis in the proximal LAD after the first diagonal branch, and 30% mid AV groove circumflex stenoses.         Catheterization 11/15 demonstrated an occluded right coronary artery with left-right collateral LVEF post TAVR 55-60%   DATE TEST EF   6/18 Echo   40 %   5/19 Echo   45-50 %  LAE-severe (57/2 0.4/54)  5/19 LHC   LAD-70%>> stent; circumflex ostial 95%>> stent; RCA-total old  9/19 LHC    circumflex stent was restenosed 95%--0     He is undergoing dialysis and tolerated it reasonably well.  He has had atrial fibrillation and he was started on Coumadin.  This was discontinued because of a rash.  He is on low-dose aspirin and Plavix recently introduced following a non-STEMI (see above)    Late August he was hospitalized for COPD exacerbation.  He was discharged and remains on home oxygen.  O2 sats yesterday off oxygen were in the mid 70s.  He is on inhalers.  Thromboembolic risk factors ( age  -2, HTN-1, TIA/CVA-2, DM-1, Vasc disease -1) for a CHADSVASc Score of 7    Past Medical History:  Diagnosis Date  . Anginal pain ([place])   . Aortic stenosis    a. severe by echo 09/2014  . Atrial fibrillation ([place])    a. not well documented, not on anticoagulation  . CHF (congestive heart failure) ([place])    04/28/17 echo-EF 40%, mod LVH, diastolic dysfunction  . Claustrophobia   . Complete heart block ([place])   . COPD (chronic  obstructive pulmonary disease) ([place])   . Coronary artery disease    a. chronically occluded RCA per cath 09/2014 with collaterals B. cath 05/01/17 chr occ RCA w/collaterals, 60-70% mid LAD,   . CVA (cerebral vascular accident) ([place]) 10/2014   denies residual on 07/11/2015  . ESRD (end stage renal disease) on dialysis [facility])    a. on dialysis; [facility]; MWF, LUE fistula (07/11/2015)  . History of blood transfusion    "related to gallbladder OR"  . History of stomach ulcers   . Hyperlipidemia   . Hypertension   . Iron deficiency anemia   . Myocardial infarction ([place]) 10/2014  . Peripheral vascular disease ([place])   . Pneumonia   . Presence of permanent cardiac pacemaker   . 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 Type II diabetes mellitus ([place])     Past Surgical History:  Procedure Laterality Date  . AV FISTULA PLACEMENT Left 10/19/2014   Procedure: BRACHIOCEPHALIC ARTERIOVENOUS (AV) FISTULA CREATION ;  Surgeon: Conrad Daingerfield, MD;  Location: [place];  Service: Vascular;  Laterality: Left;  . CARDIAC CATHETERIZATION    . CARDIAC CATHETERIZATION N/A 07/22/2015   Procedure: Right/Left Heart Cath and Coronary Angiography;  Surgeon: [name], MD;  Location: [place] CV LAB;  Service: Cardiovascular;  Laterality:  N/A;  . CATARACT EXTRACTION W/ INTRAOCULAR LENS  IMPLANT, BILATERAL Bilateral 1990's  . CHOLECYSTECTOMY OPEN  1980's  . COLONOSCOPY W/ BIOPSIES AND POLYPECTOMY    . CORONARY ANGIOGRAPHY N/A 07/31/2018   Procedure: CORONARY ANGIOGRAPHY;  Surgeon: Martinique, Peter M, MD;  Location: [place] CV LAB;  Service: Cardiovascular;  Laterality: N/A;  . CORONARY ANGIOPLASTY    . CORONARY ATHERECTOMY N/A 04/18/2018   Procedure: CORONARY ATHERECTOMY;  Surgeon: [name], MD;  Location: [place] CV LAB;  Service: Cardiovascular;  Laterality: N/A;  .  CORONARY BALLOON ANGIOPLASTY N/A 07/31/2018   Procedure: CORONARY BALLOON ANGIOPLASTY;  Surgeon: Martinique, Peter M, MD;  Location: [place] CV LAB;  Service: Cardiovascular;  Laterality: N/A;  . CORONARY STENT INTERVENTION N/A 04/18/2018   Procedure: CORONARY STENT INTERVENTION;  Surgeon: [name], MD;  Location: [place] CV LAB;  Service: Cardiovascular;  Laterality: N/A;  . EP IMPLANTABLE DEVICE N/A 07/11/2015   Procedure: Pacemaker Implant;  Surgeon: [name], MD;  Location: [place] CV LAB;  Service: Cardiovascular;  Laterality: N/A;  . ESOPHAGOGASTRODUODENOSCOPY  08/01/2012   Procedure: ESOPHAGOGASTRODUODENOSCOPY (EGD);  Surgeon: [name], MD;  Location: [facility];  Service: Endoscopy;  Laterality: N/A;  . INSERT / REPLACE / REMOVE PACEMAKER  07/11/2015  . INSERTION OF DIALYSIS CATHETER Right 02/02/2015   Procedure: INSERTION OF DIALYSIS CATHETER  RIGHT INTERNAL JUGULAR;  Surgeon: Mal [name], MD;  Location: [place];  Service: Vascular;  Laterality: Right;  . LEFT AND RIGHT HEART CATHETERIZATION WITH CORONARY ANGIOGRAM N/A 09/30/2014   Procedure: LEFT AND RIGHT HEART CATHETERIZATION WITH CORONARY ANGIOGRAM;  Surgeon: [name], MD;  Location: [facility] CATH LAB;  Service: Cardiovascular;  Laterality: N/A;  . LEFT HEART CATH AND CORONARY ANGIOGRAPHY N/A 04/14/2018   Procedure: LEFT HEART CATH AND CORONARY ANGIOGRAPHY;  Surgeon: [name], MD;  Location: [place] CV LAB;  Service: Cardiovascular;  Laterality: N/A;  . TEE WITHOUT CARDIOVERSION N/A 08/02/2015   Procedure: TRANSESOPHAGEAL ECHOCARDIOGRAM (TEE);  Surgeon: [name], MD;  Location: [place];  Service: Open Heart Surgery;  Laterality: N/A;  . TONSILLECTOMY    . TRANSCATHETER AORTIC VALVE REPLACEMENT, TRANSFEMORAL Left 08/02/2015   Procedure: TRANSCATHETER AORTIC VALVE REPLACEMENT, TRANSFEMORAL;  Surgeon: [name], MD;  Location: [place];  Service: Open Heart Surgery;  Laterality: Left;     Current Outpatient Medications  Medication Sig Dispense Refill  . albuterol (PROVENTIL HFA;VENTOLIN HFA) 108 (90 Base) MCG/ACT inhaler Inhale 2 puffs into the lungs every 6 (six) hours as needed for wheezing or shortness of breath. 1 Inhaler 2  . aspirin EC 81 MG tablet Take 1 tablet (81 mg total) by mouth daily. 90 tablet 3  . atorvastatin (LIPITOR) 80 MG tablet Take 1 tablet by mouth daily.    . cinacalcet (SENSIPAR) 30 MG tablet Take 30 mg by mouth daily with supper.    . clopidogrel (PLAVIX) 75 MG tablet Take 1 tablet (75 mg total) by mouth daily. 90 tablet 3  . Fluticasone-Salmeterol (ADVAIR DISKUS) 250-50 MCG/DOSE AEPB Inhale 1 puff into the lungs 2 (two) times daily. 60 each 0  . guaiFENesin-dextromethorphan (ROBITUSSIN DM) 100-10 MG/5ML syrup Take 5 mLs by mouth every 6 (six) hours as needed for cough. 118 mL 0  . insulin detemir (LEVEMIR) 100 UNIT/ML injection Inject 0.5 mLs (50 Units total) into the skin at bedtime. 10 mL 2  . ipratropium-albuterol (DUONEB) 0.5-2.5 (3) MG/3ML SOLN Take 3 mLs by nebulization every 6 (six) hours  as needed. 360 mL 0  . lidocaine-prilocaine (EMLA) cream Apply 1 application topically once as needed (prior to accessing port).     . metoprolol tartrate (LOPRESSOR) 25 MG tablet Take 1 tablet (25 mg total) by mouth 2 (two) times daily. 180 tablet 3  . nitroGLYCERIN (NITROSTAT) 0.4 MG SL tablet Place 1 tablet (0.4 mg total) under the tongue every 5 (five) minutes as needed for chest pain. 30 tablet 12  . pantoprazole (PROTONIX) 40 MG tablet Take 1 tablet (40 mg total) by mouth daily. 30 tablet 1  . ranitidine (ZANTAC) 150 MG tablet Take 150 mg by mouth 2 (two) times daily as needed for heartburn.    . sevelamer carbonate (RENVELA) 800 MG tablet Take 3 tablets (2,400 mg total) by mouth 3 (three) times daily with meals. 270 tablet 0  . Triamcinolone Acetonide (TRIAMCINOLONE 0.1 % CREAM : EUCERIN) CREA Apply 1 application topically 3 (three) times daily as  needed. Apply to arms, back, and other areas with active rash/itching 1 each 1   No current facility-administered medications for this visit.     Allergies  Allergen Reactions  . Byetta 10 Mcg Pen [Exenatide] Diarrhea and Nausea And Vomiting  . Codeine Itching  . Coumadin [Warfarin Sodium] Rash    Review of Systems negative except from HPI and PMH  Physical Exam BP 124/68   Pulse (!) 55   Ht 5\' 11"  (1.803 m)   Wt 234 lb (106.1 kg)   SpO2 94%   BMI 32.64 kg/m  Well developed and nourished wearing oxygen.  HENT normal Neck supple with JVP-flat Wheezes  Regular rate and rhythm, no murmurs or gallops Abd-soft with active BS No Clubbing cyanosis 2+edema Skin-warm and dry A & Oriented  Grossly normal sensory and motor function    ECG ventricular pacing with underlying atrial fibrillation   Assessment and  Plan  Complete heart block    Coronary artery disease two-vessel PCI 5/19 Cutting Balloon 9/19  Pacemaker Biotronik  The patient's device was interrogated.  The device was reprogrammed to minimize heart rate excursion  Aortic stenosis s/p TAVR  Ventricular Tachycardia  End-stage renal disease  Atrial fibrillation-flutter permanent  COPD exacerbation now on home oxygen    We will review with Dr. Krystal Eaton regarding anticoagulation.  Would recommend apixaban.  It is now his impression that Coumadin did not cause the rash.  His COPD is an issue.  He continues to wheeze.  He is on bronchodilators.  His fluid management is going to have to be done through dialysis.  Coronary disease requires DAPT for right now.  After 1 month, would recommend the apixaban as above. No intercurrent Ventricular tachycardia

## 2018-08-07 ENCOUNTER — Telehealth: Payer: Self-pay | Admitting: Internal Medicine

## 2018-08-07 NOTE — Telephone Encounter (Signed)
New Message:    Patient states he hasn't been sleeping should he take a sleeping pill

## 2018-08-07 NOTE — Telephone Encounter (Signed)
Spoke with pt's wife regarding pt's insomnia. She would like something to help him sleep. I have advised her to contact pt's PCP to address this. Pt's wife verbalized understanding and will give them a call.

## 2018-08-11 ENCOUNTER — Telehealth: Payer: Self-pay | Admitting: Internal Medicine

## 2018-08-11 NOTE — Telephone Encounter (Signed)
Patient called requesting if Dr. Caryl Comes had spoke with Dr. Lorrene Reid yet. Advised the patient that when we have information about the anticoagulant out office would give her a call.

## 2018-08-11 NOTE — Telephone Encounter (Signed)
Follow Up:     Wife called and said she thought Dr Caryl Comes was calling a blood thinner,She checked with the pharmacist and said nothing had been called in.j

## 2018-08-13 LAB — CUP PACEART INCLINIC DEVICE CHECK
Brady Statistic RA Percent Paced: 4 %
Brady Statistic RV Percent Paced: 92 %
Date Time Interrogation Session: 20190911194600
Implantable Lead Implant Date: 20160815
Implantable Lead Location: 753860
Implantable Lead Model: 5076
Implantable Pulse Generator Implant Date: 20160815
Lead Channel Impedance Value: 351 Ohm
Lead Channel Impedance Value: 409 Ohm
Lead Channel Pacing Threshold Amplitude: 0.8 V
Lead Channel Sensing Intrinsic Amplitude: 1.1 mV
Lead Channel Sensing Intrinsic Amplitude: 10.5 mV
Lead Channel Setting Pacing Pulse Width: 0.4 ms
MDC IDC LEAD IMPLANT DT: 20160815
MDC IDC LEAD LOCATION: 753859
MDC IDC MSMT LEADCHNL RV PACING THRESHOLD PULSEWIDTH: 0.4 ms
MDC IDC SET LEADCHNL RA PACING AMPLITUDE: 2 V
MDC IDC SET LEADCHNL RV PACING AMPLITUDE: 2.4 V
MDC IDC SET LEADCHNL RV SENSING SENSITIVITY: 2 mV
Pulse Gen Serial Number: 68600861

## 2018-08-13 NOTE — Telephone Encounter (Signed)
LVM for return call. 

## 2018-08-14 NOTE — Telephone Encounter (Signed)
Spoke with pt's wife regarding NOAC for pt. Pt is to stay on DAPT for at least one month post intervention before starting anticoagulant per Dr Caryl Comes. This will be around October 5 time frame. Pt has not followed up with general cardiology in greater than a year status post TAVR and recent intervention. I will discuss with Dr Caryl Comes regarding pt following general cardiology as well as EP.

## 2018-08-19 ENCOUNTER — Telehealth: Payer: Self-pay | Admitting: Internal Medicine

## 2018-08-19 NOTE — Telephone Encounter (Signed)
New Message    Pt c/o medication issue:  1. Name of Medication: n/a  2. How are you currently taking this medication (dosage and times per day)? n/a  3. Are you having a reaction (difficulty breathing--STAT)? n/a  4. What is your medication issue? Pt's daughter is calling, states that Dr. Caryl Comes was suppose to consult with Dr. Buelah Manis on a medication recommendation but they still haven't heard anything. Please call

## 2018-08-19 NOTE — Telephone Encounter (Signed)
Pts daughter understands pt needs to be on DAPT until at least Oct 7 before transitioning to anticoagulation. Pt's daughter inquired about obtaining an order for an oxygen compressor as well. I have referred her to Dr Melvyn Novas, pts pulmonologist. Pt's daughter also understands Dr Caryl Comes would like for pt to follow up with general cardiology given PMH of TAVR, CAD, etc. Per Curt Bears Thompson's last OV note, pt was to follow up with Dr Marlou Porch for general cardiology. I will message Dr Kingsley Plan scheduler to arrange this. Pt's daughter has verbalized understanding and will call with any additional questions.

## 2018-08-26 LAB — CUP PACEART REMOTE DEVICE CHECK
Implantable Lead Implant Date: 20160815
Implantable Lead Implant Date: 20160815
Implantable Lead Location: 753860
Implantable Lead Model: 5076
Implantable Pulse Generator Implant Date: 20160815
MDC IDC LEAD LOCATION: 753859
MDC IDC SESS DTM: 20191001134040
Pulse Gen Model: 394929
Pulse Gen Serial Number: 68600861

## 2018-08-27 ENCOUNTER — Telehealth: Payer: Self-pay | Admitting: Internal Medicine

## 2018-08-27 NOTE — Telephone Encounter (Signed)
New Message    *STAT* If patient is at the pharmacy, call can be transferred to refill team.   1. Which medications need to be refilled? (please list name of each medication and dose if known) clopidogrel (PLAVIX) 75 MG tablet and pantoprazole (PROTONIX) 40 MG tablet  2. Which pharmacy/location (including street and city if local pharmacy) is medication to be sent to? [facility] [place]  3. Do they need a 30 day or 90 day supply? [place]

## 2018-08-27 NOTE — Telephone Encounter (Signed)
Apixaban will therapeutic in his system a few hours after the first dose so you should be good to discontinue DAPT the day he starts Apixaban. Based on his age and weight he would meet criteria for 5mg  BID, however he does have severe kidney disease with Scr 5.73 on last check. Generally we prefer warfarin in this population, but I know Dr. Caryl Comes has used Eliquis in this population previously, so I would confirm what dose he wants with him if possible.

## 2018-08-28 NOTE — Telephone Encounter (Signed)
After verbally speaking with Tana Coast, [facility], she feels it will be better to discuss this with Dr Caryl Comes upon his return to the office regarding Eliquis vs Wafarin given pts renal failure. Pt should remain on DAPT until discussed with Dr Caryl Comes.  Pt's wife is aware of the recommendation and understands I will contact her next week with final decision.

## 2018-08-29 ENCOUNTER — Other Ambulatory Visit: Payer: Self-pay | Admitting: Internal Medicine

## 2018-08-29 MED ORDER — CLOPIDOGREL BISULFATE 75 MG PO TABS: 75.0000 mg | ORAL_TABLET | Freq: Every day | ORAL | 3 refills | Status: DC

## 2018-08-29 MED ORDER — PANTOPRAZOLE SODIUM 40 MG PO TBEC: 40.0000 mg | DELAYED_RELEASE_TABLET | Freq: Every day | ORAL | 3 refills | Status: DC

## 2018-08-29 NOTE — Telephone Encounter (Signed)
Pt's medications were sent to pt's pharmacy as requested. Confirmation received.  

## 2018-09-02 MED ORDER — APIXABAN 5 MG PO TABS: 5.0000 mg | ORAL_TABLET | Freq: Two times a day (BID) | ORAL | 3 refills | Status: DC

## 2018-09-02 MED ORDER — CLOPIDOGREL BISULFATE 75 MG PO TABS: 75.0000 mg | ORAL_TABLET | Freq: Every day | ORAL | 3 refills | Status: DC

## 2018-09-02 NOTE — Telephone Encounter (Signed)
Pt's daughter made aware that pt is to continue plavix with eliquis. He is to stop ASA. She had no additional questions.

## 2018-09-02 NOTE — Telephone Encounter (Signed)
Spoke with pt's wife. Per Dr Caryl Comes, pt should begin Eliquis, 5mg  bid. Pt's wife understands he should take this with meals. She was educated on prolonged bleeding times and the necessity of letting his other providers know he is on this anticoagulant. She understands pt is to stop his ASA and Plavix on the day he begins his Eliquis. She has verbalized understanding and had no additional questions.

## 2018-09-02 NOTE — Addendum Note (Signed)
Addended by: Dollene Primrose on: 09/02/2018 05:52 PM   Modules accepted: Orders

## 2018-09-02 NOTE — Telephone Encounter (Signed)
° °  Daughter [name] would like clarification on instructions given regarding taking  Eliquis and stopping Plavix

## 2018-09-02 NOTE — Telephone Encounter (Signed)
Per Dr Caryl Comes, he will need to double check with Dr Martinique to be sure pt can come off plavix and ASA, beginning Eliquis. For now, pt is to d/c ASA, continue plavix and begin Eliquis.

## 2018-09-02 NOTE — Telephone Encounter (Signed)
He needs to stay on Plavix  Peter Martinique MD, [facility]

## 2018-09-18 ENCOUNTER — Encounter: Payer: Self-pay | Admitting: Nurse Practitioner

## 2018-09-18 ENCOUNTER — Ambulatory Visit (INDEPENDENT_AMBULATORY_CARE_PROVIDER_SITE_OTHER)
Admission: RE | Admit: 2018-09-18 | Discharge: 2018-09-18 | Disposition: A | Discharge status: Home / Self Care | Payer: Medicare Other | Source: Ambulatory Visit | Attending: Nurse Practitioner | Admitting: Nurse Practitioner

## 2018-09-18 ENCOUNTER — Ambulatory Visit: Payer: Medicare Other | Admitting: Nurse Practitioner

## 2018-09-18 VITALS — BP 138/70 | HR 64 | Ht 71.0 in | Wt 236.0 lb

## 2018-09-18 DIAGNOSIS — R05 Cough: Secondary | ICD-10-CM

## 2018-09-18 DIAGNOSIS — J449 Chronic obstructive pulmonary disease, unspecified: Secondary | ICD-10-CM | POA: Diagnosis not present

## 2018-09-18 DIAGNOSIS — R1310 Dysphagia, unspecified: Secondary | ICD-10-CM | POA: Diagnosis not present

## 2018-09-18 DIAGNOSIS — R059 Cough, unspecified: Secondary | ICD-10-CM

## 2018-09-18 MED ORDER — AZITHROMYCIN 250 MG PO TABS: ORAL_TABLET | ORAL | 0 refills | Status: DC

## 2018-09-18 MED ORDER — LORATADINE 10 MG PO TABS: 10.0000 mg | ORAL_TABLET | Freq: Every day | ORAL | 11 refills | Status: DC

## 2018-09-18 MED ORDER — AMOXICILLIN-POT CLAVULANATE 875-125 MG PO TABS: 1.0000 | ORAL_TABLET | Freq: Two times a day (BID) | ORAL | 0 refills | Status: DC

## 2018-09-18 MED ORDER — PREDNISONE 10 MG PO TABS: ORAL_TABLET | ORAL | 0 refills | Status: DC

## 2018-09-18 NOTE — Patient Instructions (Addendum)
Will order augmentin Will order prednisone Will order chest x ray and call with results Samples of mucinex and delsym given Continue O2 continuously Stay active Follow up with Dr. Melvyn Novas in 1-2 months or sooner if needed

## 2018-09-18 NOTE — Assessment & Plan Note (Signed)
Patient Instructions  Will order augmentin Will order prednisone Will order chest x ray and call with results Samples of mucinex and delsym given Continue O2 continuously Stay active Follow up with Dr. Melvyn Novas in 1-2 months or sooner if needed

## 2018-09-18 NOTE — Progress Notes (Signed)
@Patient  ID: Jeffrey [name], male    DOB: March 19, 1941, 77 y.o.   MRN: [id number]  Chief Complaint  Patient presents with  . Cough    with congestion    Referring provider: [name], Dianna Rossetti, MD  HPI 77 year old male with COPD GOLD 0, end stage renal disease on hemodialysis 4 days per week followed by Dr. Melvyn Novas.   Tests:  09/26/2017-spirometry-normal spirometry, ratio 76, FEV1 83%  Imaging:  05/27/2018-chest x-ray- bronchitic changes with minimal right basilar atelectasis  Cardiac:  04/17/2018-echocardiogram-LV ejection fraction 45 to 50%, mild regurgitation of mitral valve  OV 09/18/18 - cough and congestion Patient presents with cough and congestion. He states that symptoms started a couple of weeks ago. Cough is productive of yellow/green sputum. States that symptoms are progressively worsening. Patient uses O2 at 3-4 L [place] continuously. He denies any sinus congestion. He is compliant with current medications. He denies any edema, chest pain, or fever.    Allergies  Allergen Reactions  . Byetta 10 Mcg Pen [Exenatide] Diarrhea and Nausea And Vomiting  . Codeine Itching  . Coumadin [Warfarin Sodium] Rash    Immunization History  Administered Date(s) Administered  . Influenza Split 08/31/2013  . Influenza, High Dose Seasonal PF 11/21/2016, 09/11/2017  . Pneumococcal Conjugate-13 05/27/2015  . Pneumococcal Polysaccharide-23 05/03/2015, 12/05/2016  . Pneumococcal-Unspecified 05/27/2015    Past Medical History:  Diagnosis Date  . Anginal pain ([place])   . Aortic stenosis    a. severe by echo 09/2014  . Atrial fibrillation ([place])    a. not well documented, not on anticoagulation  . CHF (congestive heart failure) ([place])    04/28/17 echo-EF 40%, mod LVH, diastolic dysfunction  . Claustrophobia   . Complete heart block ([place])   . COPD (chronic obstructive pulmonary disease) ([place])   . Coronary artery disease    a. chronically occluded RCA per cath 09/2014 with collaterals B. cath  05/01/17 chr occ RCA w/collaterals, 60-70% mid LAD,   . CVA (cerebral vascular accident) ([place]) 10/2014   denies residual on 07/11/2015  . ESRD (end stage renal disease) on dialysis [facility])    a. on dialysis; [facility]; MWF, LUE fistula (07/11/2015)  . History of blood transfusion    "related to gallbladder OR"  . History of stomach ulcers   . Hyperlipidemia   . Hypertension   . Iron deficiency anemia   . Myocardial infarction ([place]) 10/2014  . Peripheral vascular disease ([place])   . Pneumonia   . Presence of permanent cardiac pacemaker   . 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 Type II diabetes mellitus ([facility])     Tobacco History: Social History   Tobacco Use  Smoking Status Former Smoker  . Packs/day: 2.00  . Years: 32.00  . Pack years: 64.00  . Last attempt to quit: 11/27/1983  . Years since quitting: 34.8  Smokeless Tobacco Never Used   Counseling given: Yes   Outpatient Encounter Medications as of 09/18/2018  Medication Sig  . albuterol (PROVENTIL HFA;VENTOLIN HFA) 108 (90 Base) MCG/ACT inhaler Inhale 2 puffs into the lungs every 6 (six) hours as needed for wheezing or shortness of breath.  [name] apixaban (ELIQUIS) 5 MG TABS tablet Take 1 tablet (5 mg total) by mouth 2 (two) times daily with a meal.  . cinacalcet (SENSIPAR) 30 MG tablet Take 30 mg by mouth daily with supper.  [name]  clopidogrel (PLAVIX) 75 MG tablet Take 1 tablet (75 mg total) by mouth daily.  [name] guaiFENesin-dextromethorphan (ROBITUSSIN DM) 100-10 MG/5ML syrup Take 5 mLs by mouth every 6 (six) hours as needed for cough.  . insulin detemir (LEVEMIR) 100 UNIT/ML injection Inject 0.5 mLs (50 Units total) into the skin at bedtime.  . Insulin Pen Needle 31G X 6 MM MISC USE 1  THREE TIMES DAILY  . ipratropium-albuterol (DUONEB) 0.5-2.5 (3) MG/3ML SOLN Take 3 mLs by nebulization every 6 (six) hours as needed.    . lidocaine-prilocaine (EMLA) cream Apply 1 application topically once as needed (prior to accessing port).   . metoprolol tartrate (LOPRESSOR) 25 MG tablet Take 1 tablet (25 mg total) by mouth 2 (two) times daily.  . nitroGLYCERIN (NITROSTAT) 0.4 MG SL tablet Place 1 tablet (0.4 mg total) under the tongue every 5 (five) minutes as needed for chest pain.  . pantoprazole (PROTONIX) 40 MG tablet Take 1 tablet (40 mg total) by mouth daily.  . ranitidine (ZANTAC) 150 MG tablet Take 150 mg by mouth 2 (two) times daily as needed for heartburn.  . Triamcinolone Acetonide (TRIAMCINOLONE 0.1 % CREAM : EUCERIN) CREA Apply 1 application topically 3 (three) times daily as needed. Apply to arms, back, and other areas with active rash/itching  . [DISCONTINUED] amoxicillin-clavulanate (AUGMENTIN) 875-125 MG tablet Take 1 tablet by mouth 2 (two) times daily.  . Fluticasone-Salmeterol (ADVAIR DISKUS) 250-50 MCG/DOSE AEPB Inhale 1 puff into the lungs 2 (two) times daily.  [name] loratadine (CLARITIN) 10 MG tablet Take 1 tablet (10 mg total) by mouth daily.  . predniSONE (DELTASONE) 10 MG tablet Take 3 tabs for 2 days, then 2 tabs for 2 days, then 1 tab for 2 days, then stop  . triamcinolone cream (KENALOG) 0.1 % APPLY TOPICALLY 3 TIMES DAILY TO ARMS BACK AND OTHER AREAS WITH ACTIVE RASH ITCHING  . [DISCONTINUED] atorvastatin (LIPITOR) 80 MG tablet Take 1 tablet by mouth daily.  . [DISCONTINUED] azithromycin (ZITHROMAX) 250 MG tablet Take 2 tablets (500 mg) on day 1, then take 1 tablet (250 mg) on days 2-5  . [DISCONTINUED] Insulin Detemir (LEVEMIR) 100 UNIT/ML Pen Inject into the skin.   No facility-administered encounter medications on file as of 09/18/2018.      Review of Systems  Review of Systems  Constitutional: Negative.  Negative for chills and fever.  HENT: Negative.  Negative for congestion.   Respiratory: Positive for cough and shortness of breath. Negative for wheezing.   Cardiovascular: Negative.   Negative for chest pain, palpitations and leg swelling.  Gastrointestinal: Negative.   Allergic/Immunologic: Negative.   Neurological: Negative.   Psychiatric/Behavioral: Negative.        Physical Exam  BP 138/70 (BP Location: Right Arm, Patient Position: Sitting, Cuff Size: Normal)   Pulse 64   Ht 5\' 11"  (1.803 m)   Wt 236 lb (107 kg)   SpO2 99% Comment: on 3L of O2  BMI 32.92 kg/m   Wt Readings from Last 5 Encounters:  09/18/18 236 lb (107 kg)  08/06/18 234 lb (106.1 kg)  08/01/18 232 lb 12.9 oz (105.6 kg)  07/28/18 235 lb 0.2 oz (106.6 kg)  06/23/18 236 lb (107 kg)     Physical Exam  Constitutional: He is oriented to person, place, and time. He appears well-developed and well-nourished. No distress.  Cardiovascular: Normal rate and regular rhythm.  Pulmonary/Chest: Effort normal and breath sounds normal. No respiratory distress. He has no wheezes.  Musculoskeletal: He exhibits no edema.  Neurological: He is alert and oriented to person, place, and time.  Skin: Skin is warm and dry.  Psychiatric: He has a normal mood and affect.  Nursing note and vitals reviewed.      Assessment & Plan:   Cough Patient Instructions  Will order augmentin Will order prednisone Will order chest x ray and call with results Samples of mucinex and delsym given Continue O2 continuously Stay active Follow up with Dr. Melvyn Novas in 1-2 months or sooner if needed     COPD GOLD 0 Patient Instructions  Will order augmentin Will order prednisone Will order chest x ray and call with results Samples of mucinex and delsym given Continue O2 continuously Stay active Follow up with Dr. Melvyn Novas in 1-2 months or sooner if needed        Fenton Foy, NP 09/18/2018

## 2018-09-22 NOTE — Progress Notes (Signed)
Chart and office note reviewed in detail  > agree with a/p as outlined    

## 2018-09-23 NOTE — Addendum Note (Signed)
Addended by: Nena Polio on: 09/23/2018 01:53 PM   Modules accepted: Orders

## 2018-09-24 NOTE — Addendum Note (Signed)
Addended by: Parke Poisson E on: 09/24/2018 04:21 PM   Modules accepted: Orders

## 2018-09-26 ENCOUNTER — Other Ambulatory Visit (HOSPITAL_COMMUNITY): Payer: Self-pay | Admitting: Nurse Practitioner

## 2018-09-26 DIAGNOSIS — R1310 Dysphagia, unspecified: Secondary | ICD-10-CM

## 2018-10-02 ENCOUNTER — Ambulatory Visit (HOSPITAL_COMMUNITY)
Admission: RE | Admit: 2018-10-02 | Discharge: 2018-10-02 | Disposition: A | Discharge status: Home / Self Care | Payer: Medicare Other | Source: Ambulatory Visit | Attending: Nurse Practitioner | Admitting: Nurse Practitioner

## 2018-10-02 ENCOUNTER — Ambulatory Visit (HOSPITAL_COMMUNITY): Payer: Medicare Other

## 2018-10-02 ENCOUNTER — Encounter (HOSPITAL_COMMUNITY): Payer: Medicare Other

## 2018-10-02 DIAGNOSIS — R059 Cough, unspecified: Secondary | ICD-10-CM

## 2018-10-02 DIAGNOSIS — R1310 Dysphagia, unspecified: Secondary | ICD-10-CM | POA: Insufficient documentation

## 2018-10-02 DIAGNOSIS — R05 Cough: Secondary | ICD-10-CM

## 2018-10-09 NOTE — Addendum Note (Signed)
Addended by: Nena Polio on: 10/09/2018 03:37 PM   Modules accepted: Orders

## 2018-10-09 NOTE — Progress Notes (Signed)
Test ordered

## 2018-10-09 NOTE — Progress Notes (Signed)
Please order regular barium swallow per recommendations of speech language pathologist.

## 2018-10-16 ENCOUNTER — Ambulatory Visit (HOSPITAL_COMMUNITY)
Admission: RE | Admit: 2018-10-16 | Discharge: 2018-10-16 | Disposition: A | Discharge status: Home / Self Care | Payer: Medicare Other | Source: Ambulatory Visit | Attending: Nurse Practitioner | Admitting: Nurse Practitioner

## 2018-10-16 DIAGNOSIS — R059 Cough, unspecified: Secondary | ICD-10-CM

## 2018-10-16 DIAGNOSIS — K224 Dyskinesia of esophagus: Secondary | ICD-10-CM | POA: Insufficient documentation

## 2018-10-16 DIAGNOSIS — R05 Cough: Secondary | ICD-10-CM | POA: Insufficient documentation

## 2018-10-16 DIAGNOSIS — R1310 Dysphagia, unspecified: Secondary | ICD-10-CM | POA: Insufficient documentation

## 2018-10-21 ENCOUNTER — Ambulatory Visit: Payer: Medicare Other | Admitting: Internal Medicine

## 2018-10-21 NOTE — Progress Notes (Signed)
I will let GI decide if it needs to be done.

## 2018-10-22 ENCOUNTER — Other Ambulatory Visit: Payer: Self-pay | Admitting: General Surgery

## 2018-10-22 DIAGNOSIS — K224 Dyskinesia of esophagus: Secondary | ICD-10-CM

## 2018-10-27 ENCOUNTER — Other Ambulatory Visit (HOSPITAL_COMMUNITY): Payer: Medicare Other

## 2018-10-28 ENCOUNTER — Ambulatory Visit (INDEPENDENT_AMBULATORY_CARE_PROVIDER_SITE_OTHER): Payer: Medicare Other | Admitting: Internal Medicine

## 2018-10-28 ENCOUNTER — Encounter: Payer: Self-pay | Admitting: Internal Medicine

## 2018-10-28 DIAGNOSIS — J9611 Chronic respiratory failure with hypoxia: Secondary | ICD-10-CM | POA: Diagnosis not present

## 2018-10-28 DIAGNOSIS — J449 Chronic obstructive pulmonary disease, unspecified: Secondary | ICD-10-CM | POA: Diagnosis not present

## 2018-10-28 MED ORDER — PREDNISONE 10 MG PO TABS: ORAL_TABLET | ORAL | 0 refills | Status: DC

## 2018-10-28 MED ORDER — BUDESONIDE-FORMOTEROL FUMARATE 80-4.5 MCG/ACT IN AERO: INHALATION_SPRAY | RESPIRATORY_TRACT | 11 refills | Status: DC

## 2018-10-28 MED ORDER — BUDESONIDE-FORMOTEROL FUMARATE 160-4.5 MCG/ACT IN AERO: 2.0000 | INHALATION_SPRAY | Freq: Two times a day (BID) | RESPIRATORY_TRACT | 0 refills | Status: DC

## 2018-10-28 MED ORDER — AMOXICILLIN-POT CLAVULANATE 875-125 MG PO TABS: 1.0000 | ORAL_TABLET | Freq: Two times a day (BID) | ORAL | 0 refills | Status: AC

## 2018-10-28 NOTE — Patient Instructions (Addendum)
Bed blocks x 6-8 inches  GERD (REFLUX)  is an extremely common cause of respiratory symptoms just like yours , many times with no obvious heartburn at all.    It can be treated with medication, but also with lifestyle changes including elevation of the head of your bed (ideally with 6 inch  bed blocks),  Smoking cessation, avoidance of late meals, excessive alcohol, and avoid fatty foods, chocolate, peppermint, colas, red wine, and acidic juices such as orange juice.  NO MINT OR MENTHOL PRODUCTS SO NO COUGH DROPS   USE SUGARLESS CANDY INSTEAD (Jolley ranchers or Stover's or Life Savers) or even ice chips will also do - the key is to swallow to prevent all throat clearing. NO OIL BASED VITAMINS - use powdered substitutes.    Plan A = Automatic = stop advair and start symbicort 160 Take 2 puffs first thing in am and then another 2 puffs about 12 hours later and when it runs out change to symbicort 80 Take 2 puffs first thing in am and then another 2 puffs about 12 hours later.    Work on inhaler technique:  relax and gently blow all the way out then take a nice smooth deep breath back in, triggering the inhaler at same time you start breathing in.  Hold for up to 5 seconds if you can. Blow out thru nose. Rinse and gargle with water when done      Plan B = Backup Only use your albuterol proair)  inhaler as a rescue medication to be used if you can't catch your breath by resting or doing a relaxed purse lip breathing pattern.  - The less you use it, the better it will work when you need it. - Ok to use the inhaler up to 2 puffs  every 4 hours if you must but call for appointment if use goes up over your usual need - Don't leave home without it !!  (think of it like the spare tire for your car)   Plan C = Crisis - only use your albuterol-ipatropium  nebulizer if you first try Plan B and it fails to help > ok to use the nebulizer up to every 4 hours but if start needing it regularly call for  immediate appointment   Continue Pantoprazole (protonix) 40 mg   Take  30-60 min before first meal of the day and Pepcid (famotidine)  20 mg one @  bedtime until return to office - this is the best way to tell whether stomach acid is contributing to your problem  Augmentin 875 mg take one pill twice daily  X 10 days - take at breakfast and supper with large glass of water.  It would help reduce the usual side effects (diarrhea and yeast infections) if you ate cultured yogurt at lunch.   Prednisone 10 mg take  4 each am x 2 days,   2 each am x 2 days,  1 each am x 2 days and stop   Tell the folks at dialysis that you take the protonix before dialysis   Please schedule a follow up office visit in 4 weeks, sooner if needed with pfts on return

## 2018-10-28 NOTE — Progress Notes (Addendum)
Jeffrey [name]    [id number]       Primary Care Physician:[name],[name], MD  Referring Physician: Thurman Coyer, MD [address] [place], [place] [address]  Chief complaint:  Consult for evaluation, management of COPD  HPI: 77-yowm obese s/p smoking cessation 1985  with history of end-stage renal disease on hemodialysis, COPD GOLD 0    diabetes mellitus, aortic stenosis s/p TAVR, ICD implantation. He was admitted in April 2018 for acute respiratory failure, COPD exacerbation in setting of parainfluenza virus. He was treated with Solu-Medrol, doonebs, Mucinex, doxycycline with improvement in symptoms.   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 Smoking history: 60 pack year smoking history. Quit in 1983.     04/16/2017 acute extended ov/Ryken Paschal re:  aecopd / GOLD 0 criteria previously  Chief Complaint  Patient presents with  . Acute Visit    Pt states "lung full of congestion"- increased SOB x 2 months. He states also coughing more and cough was prod last night after he used symbicort with green sputum.  He has only been using symbicort every 4 hours and not using his albuterol inhaler. He uses neb once per day on average.   downhill since transient improvement p d/c from hospital 2 m prior to OV   nausea is 24/7  symb over use (up to every 4 hours) and extremely poor insight into meds  HD mwf  No better with prednisone in past  Cough worse in am's, better p neb, green mucus mostly in ams Sob at rest between treatments and no better before vs after HD  rec Change Zantac 150 mg in am (after dialysis) and one at bedtime  omnicef 300 mg twice daily x 7 days  For cough > mucinex dm 1200 mg every 12 hours as needed and use the flutter valve as much as possible Plan A = Automatic = Symbicort 160 Take 2 puffs first thing in am and then another 2 puffs about 12  hours later.  Plan B = Backup Only use your albuterol (proventil)  Plan C = Crisis -   use your albuterol/ipatropium nebulizer if you first try Plan B and it fails to help > ok to use the nebulizer up to every 4 hours but if start needing it regularly call for immediate appointment See Tammy NP w/in 2 weeks with all your medications   DgEs 10/16/18 Nonspecific esophageal motility disorder. While there is no fixed focal stricture, the mid to distal esophagus appears slightly smaller caliber relative to the proximal thoracic esophagus. The barium tablet sticks in the midesophagus and again in the distal esophagus. May consider further evaluation with Endoscopy > not done    10/28/2018  f/u ov/Vyctoria Dickman re:  Re-establish re sob/ cough/ 02 3lpm 24/7  - worse since summer 2019  Chief Complaint  Patient presents with  . Follow-up   Dyspnea: 10 ft with cane and one person assist  Cough:  Worse at hs lies on R side / can't breath on back x one year whether p HD or not  Sleeping: ok  But  full of congestion in throat in am > sev tsp mucoid  SABA use: proventil once or twice daily helps some 02: 3lpm 24/7    Has hb symptoms despite ppi which he take prior to HD    No obvious day to day or daytime variability  or assoc excess/ purulent sputum or mucus plugs or hemoptysis or cp or chest tightness, subjective wheeze    Sleeping as above  without nocturnal  or early am exacerbation  of respiratory  c/o's or need for noct saba. Also denies any obvious fluctuation of symptoms with weather or environmental changes or other aggravating or alleviating factors except as outlined above   No unusual exposure hx or h/o childhood pna/ asthma or knowledge of premature birth.  Current Allergies, Complete Past Medical History, Past Surgical History, Family History, and Social History were reviewed in [employer] record.  ROS  The following are not active complaints unless  bolded Hoarseness, sore throat, dysphagia, dental problems, itching, sneezing,  nasal congestion or discharge of excess mucus or purulent secretions, ear ache,   fever, chills, sweats, unintended wt loss or wt gain, classically pleuritic or exertional cp,  orthopnea pnd or arm/hand swelling  or leg swelling, presyncope, palpitations, abdominal pain, anorexia, nausea, vomiting, diarrhea  or change in bowel habits or change in bladder habits, change in stools or change in urine, dysuria, hematuria,  rash, arthralgias, visual complaints, headache, numbness, weakness or ataxia or problems with walking or coordination,  change in mood or  memory.        Current Meds  Medication Sig  . albuterol (PROVENTIL HFA;VENTOLIN HFA) 108 (90 Base) MCG/ACT inhaler Inhale 2 puffs into the lungs every 6 (six) hours as needed for wheezing or shortness of breath.  [name] apixaban (ELIQUIS) 5 MG TABS tablet Take 1 tablet (5 mg total) by mouth 2 (two) times daily with a meal.  . cinacalcet (SENSIPAR) 30 MG tablet Take 30 mg by mouth daily with supper.  . clopidogrel (PLAVIX) 75 MG tablet Take 1 tablet (75 mg total) by mouth daily.  [name] guaiFENesin-dextromethorphan (ROBITUSSIN DM) 100-10 MG/5ML syrup Take 5 mLs by mouth every 6 (six) hours as needed for cough.  . insulin detemir (LEVEMIR) 100 UNIT/ML injection Inject 0.5 mLs (50 Units total) into the skin at bedtime.  . Insulin Pen Needle 31G X 6 MM MISC USE 1  THREE TIMES DAILY  . lidocaine-prilocaine (EMLA) cream Apply 1 application topically once as needed (prior to accessing port).   . loratadine (CLARITIN) 10 MG tablet Take 1 tablet (10 mg total) by mouth daily.  . metoprolol tartrate (LOPRESSOR) 25 MG tablet Take 1 tablet (25 mg total) by mouth 2 (two) times daily.  . nitroGLYCERIN (NITROSTAT) 0.4 MG SL tablet Place 1 tablet (0.4 mg total) under the tongue every 5 (five) minutes as needed for chest pain.  . pantoprazole (PROTONIX) 40 MG tablet Take 1 tablet (40 mg total) by  mouth daily.  . Triamcinolone Acetonide (TRIAMCINOLONE 0.1 % CREAM : EUCERIN) CREA Apply 1 application topically 3 (three) times daily as needed. Apply to arms, back, and other areas with active rash/itching  . triamcinolone cream (KENALOG) 0.1 % APPLY TOPICALLY 3 TIMES DAILY TO ARMS BACK AND OTHER AREAS WITH ACTIVE RASH ITCHING               Physical Exam:      Elderly mildly  hoarse wm w/c bound/ nad    10/28/2018      236   04/16/17 230 lb (104.3 kg)  03/27/17 231 lb 12.8 oz (105.1 kg)  03/13/17 231 lb 7.7 oz (105 kg)    Vital signs reviewed - Note on arrival 02 sats  100% on 3lpm  HEENT: Edentulous/ nl turbinates bilaterally, and oropharynx. Nl external ear canals without cough reflex   NECK :  without JVD/Nodes/TM/ nl carotid upstrokes bilaterally   LUNGS: no acc muscle use,  Chest with  Distant  insp/exp rhonchi  bilaterally without cough on insp or exp maneuvers   CV:  RRR  no s3   II/VI SEM  no increase in P2, and Trace sym pitting lower ext edema bilaterally   ABD:  soft and nontender with nl inspiratory excursion in the supine position. No bruits or organomegaly appreciated, bowel sounds nl  MS:    ext warm without deformities, calf tenderness, cyanosis or clubbing No obvious joint restrictions   SKIN: warm and dry without lesions    NEURO:  alert, approp, nl sensorium with  no motor or cerebellar deficits apparent.         I personally reviewed images and agree with radiology impression as follows:  CXR:   09/18/18 No acute abnormality noted.

## 2018-10-29 ENCOUNTER — Encounter: Payer: Self-pay | Admitting: Internal Medicine

## 2018-10-29 DIAGNOSIS — J9611 Chronic respiratory failure with hypoxia: Secondary | ICD-10-CM | POA: Insufficient documentation

## 2018-10-29 NOTE — Assessment & Plan Note (Addendum)
Quit smoking in 1985 PFTs 07/20/15 FEV1 1.71 (61%) ratio  75  DLCO 53%  - Spirometry 09/26/2017  FEV1 2.55 (83%)  Ratio 76 off all rx x months  - 10/28/2018  After extensive coaching inhaler device,  effectiveness =    75% > try sym 160 2bid samples then reduce to 80 2bid due to concern may make hoarsness / upper airway cough worse   DDX of  difficult airways management almost all start with A and  include Adherence, Ace Inhibitors, Acid Reflux, Active Sinus Disease, Alpha 1 Antitripsin deficiency, Anxiety masquerading as Airways dz,  ABPA,  Allergy(esp in young), Aspiration (esp in elderly), Adverse effects of meds,  Active smoking or vaping, A bunch of PE's (a small clot burden can't cause this syndrome unless there is already severe underlying pulm or vascular dz with poor reserve) plus two Bs  = Bronchiectasis and Beta blocker use..and one C= CHF   Adherence is always the initial "prime suspect" and is a multilayered concern that requires a "trust but verify" approach in every patient - starting with knowing how to use medications, especially inhalers, correctly, keeping up with refills and understanding the fundamental difference between maintenance and prns vs those medications only taken for a very short course and then stopped and not refilled.  - see hfa teaching   ? Allergy/ asthma > symb 160 trial / pred x 6 days  ? Active sinus dz > Augmentin 875 mg take one pill twice daily  X 10 days / continue clariton prn   ? Acid (or non-acid) GERD > always difficult to exclude as up to 75% of pts in some series report no assoc GI/ Heartburn symptoms> rec max (24h)  acid suppression and diet restrictions/ reviewed and instructions given in writing.  - ask HD re timing of ppi / add bed blocks  ? A bunch of pe's > unlikely on HD/ eliquis   ? BB effects > unlikely on low dose toprol  ? chf > unlikely if HD not helping but keep cardiac asthma in ddx    >>> f/u in 4 weeks with repeat full  pfts

## 2018-10-29 NOTE — Assessment & Plan Note (Signed)
Adequate control on present rx, reviewed in detail with pt > no change in rx needed  = 3lpm 24/7 for now until sort out underlying problem    I had an extended discussion with the patient reviewing all relevant studies completed to date and  lasting 15 to 20 minutes of a 25 minute visit to re-establish with me   See device teaching which extended face to face time for this visit    Each maintenance medication was reviewed in detail including most importantly the difference between maintenance and prns and under what circumstances the prns are to be triggered using an action plan format that is not reflected in the computer generated alphabetically organized AVS.     Please see AVS for specific instructions unique to this visit that I personally wrote and verbalized to the the pt in detail and then reviewed with pt  by my nurse highlighting any  changes in therapy recommended at today's visit to their plan of care.

## 2018-10-30 ENCOUNTER — Ambulatory Visit (INDEPENDENT_AMBULATORY_CARE_PROVIDER_SITE_OTHER): Payer: Medicare Other

## 2018-10-30 DIAGNOSIS — I442 Atrioventricular block, complete: Secondary | ICD-10-CM

## 2018-10-30 NOTE — Progress Notes (Signed)
Remote pacemaker transmission.   

## 2018-11-05 ENCOUNTER — Emergency Department (HOSPITAL_COMMUNITY)
Admission: EM | Admit: 2018-11-05 | Discharge: 2018-11-05 | Disposition: A | Discharge status: Home / Self Care | Payer: Medicare Other | Attending: Emergency Medicine | Admitting: Emergency Medicine

## 2018-11-05 ENCOUNTER — Emergency Department (HOSPITAL_COMMUNITY): Payer: Medicare Other

## 2018-11-05 ENCOUNTER — Encounter: Payer: Self-pay | Admitting: Cardiology

## 2018-11-05 ENCOUNTER — Other Ambulatory Visit: Payer: Self-pay

## 2018-11-05 ENCOUNTER — Encounter (HOSPITAL_COMMUNITY): Payer: Self-pay | Admitting: Emergency Medicine

## 2018-11-05 DIAGNOSIS — W010XXA Fall on same level from slipping, tripping and stumbling without subsequent striking against object, initial encounter: Secondary | ICD-10-CM | POA: Diagnosis not present

## 2018-11-05 DIAGNOSIS — Y939 Activity, unspecified: Secondary | ICD-10-CM | POA: Diagnosis not present

## 2018-11-05 DIAGNOSIS — Z8673 Personal history of transient ischemic attack (TIA), and cerebral infarction without residual deficits: Secondary | ICD-10-CM | POA: Insufficient documentation

## 2018-11-05 DIAGNOSIS — E785 Hyperlipidemia, unspecified: Secondary | ICD-10-CM | POA: Diagnosis not present

## 2018-11-05 DIAGNOSIS — I5032 Chronic diastolic (congestive) heart failure: Secondary | ICD-10-CM | POA: Diagnosis not present

## 2018-11-05 DIAGNOSIS — W19XXXA Unspecified fall, initial encounter: Secondary | ICD-10-CM

## 2018-11-05 DIAGNOSIS — Z7901 Long term (current) use of anticoagulants: Secondary | ICD-10-CM | POA: Diagnosis not present

## 2018-11-05 DIAGNOSIS — Y998 Other external cause status: Secondary | ICD-10-CM | POA: Diagnosis not present

## 2018-11-05 DIAGNOSIS — N186 End stage renal disease: Secondary | ICD-10-CM | POA: Diagnosis not present

## 2018-11-05 DIAGNOSIS — Z79899 Other long term (current) drug therapy: Secondary | ICD-10-CM | POA: Insufficient documentation

## 2018-11-05 DIAGNOSIS — S0101XA Laceration without foreign body of scalp, initial encounter: Secondary | ICD-10-CM | POA: Diagnosis not present

## 2018-11-05 DIAGNOSIS — I132 Hypertensive heart and chronic kidney disease with heart failure and with stage 5 chronic kidney disease, or end stage renal disease: Secondary | ICD-10-CM | POA: Insufficient documentation

## 2018-11-05 DIAGNOSIS — E1122 Type 2 diabetes mellitus with diabetic chronic kidney disease: Secondary | ICD-10-CM | POA: Insufficient documentation

## 2018-11-05 DIAGNOSIS — I2510 Atherosclerotic heart disease of native coronary artery without angina pectoris: Secondary | ICD-10-CM | POA: Diagnosis not present

## 2018-11-05 DIAGNOSIS — Z7902 Long term (current) use of antithrombotics/antiplatelets: Secondary | ICD-10-CM | POA: Insufficient documentation

## 2018-11-05 DIAGNOSIS — I4891 Unspecified atrial fibrillation: Secondary | ICD-10-CM | POA: Diagnosis not present

## 2018-11-05 DIAGNOSIS — Y92002 Bathroom of unspecified non-institutional (private) residence single-family (private) house as the place of occurrence of the external cause: Secondary | ICD-10-CM | POA: Insufficient documentation

## 2018-11-05 DIAGNOSIS — J449 Chronic obstructive pulmonary disease, unspecified: Secondary | ICD-10-CM | POA: Diagnosis not present

## 2018-11-05 DIAGNOSIS — Z87891 Personal history of nicotine dependence: Secondary | ICD-10-CM | POA: Insufficient documentation

## 2018-11-05 DIAGNOSIS — I252 Old myocardial infarction: Secondary | ICD-10-CM | POA: Insufficient documentation

## 2018-11-05 DIAGNOSIS — Z992 Dependence on renal dialysis: Secondary | ICD-10-CM | POA: Diagnosis not present

## 2018-11-05 DIAGNOSIS — Z95810 Presence of automatic (implantable) cardiac defibrillator: Secondary | ICD-10-CM | POA: Insufficient documentation

## 2018-11-05 DIAGNOSIS — S0990XA Unspecified injury of head, initial encounter: Secondary | ICD-10-CM | POA: Diagnosis present

## 2018-11-05 LAB — CBC WITH DIFFERENTIAL/PLATELET
Abs Immature Granulocytes: 0.11 10*3/uL — ABNORMAL HIGH (ref 0.00–0.07)
Basophils Absolute: 0 10*3/uL (ref 0.0–0.1)
Basophils Relative: 0 %
EOS PCT: 1 %
Eosinophils Absolute: 0.1 10*3/uL (ref 0.0–0.5)
HCT: 30.8 % — ABNORMAL LOW (ref 39.0–52.0)
HEMOGLOBIN: 9.2 g/dL — AB (ref 13.0–17.0)
IMMATURE GRANULOCYTES: 1 %
Lymphocytes Relative: 9 %
Lymphs Abs: 0.9 10*3/uL (ref 0.7–4.0)
MCH: 27.8 pg (ref 26.0–34.0)
MCHC: 29.9 g/dL — ABNORMAL LOW (ref 30.0–36.0)
MCV: 93.1 fL (ref 80.0–100.0)
MONO ABS: 1 10*3/uL (ref 0.1–1.0)
Monocytes Relative: 11 %
Neutro Abs: 7.5 10*3/uL (ref 1.7–7.7)
Neutrophils Relative %: 78 %
Platelets: 210 10*3/uL (ref 150–400)
RBC: 3.31 MIL/uL — ABNORMAL LOW (ref 4.22–5.81)
RDW: 15.9 % — ABNORMAL HIGH (ref 11.5–15.5)
WBC: 9.6 10*3/uL (ref 4.0–10.5)
nRBC: 0 % (ref 0.0–0.2)

## 2018-11-05 LAB — BASIC METABOLIC PANEL
Anion gap: 15 (ref 5–15)
BUN: 46 mg/dL — ABNORMAL HIGH (ref 8–23)
CO2: 24 mmol/L (ref 22–32)
Calcium: 9.3 mg/dL (ref 8.9–10.3)
Chloride: 93 mmol/L — ABNORMAL LOW (ref 98–111)
Creatinine, Ser: 4.29 mg/dL — ABNORMAL HIGH (ref 0.61–1.24)
GFR calc Af Amer: 14 mL/min — ABNORMAL LOW (ref >60)
GFR calc non Af Amer: 12 mL/min — ABNORMAL LOW (ref >60)
Glucose, Bld: 393 mg/dL — ABNORMAL HIGH (ref 70–99)
Potassium: 4.5 mmol/L (ref 3.5–5.1)
Sodium: 132 mmol/L — ABNORMAL LOW (ref 135–145)

## 2018-11-05 MED ORDER — "THROMBI-PAD 3""X3"" EX PADS"
1.0000 | MEDICATED_PAD | Freq: Once | CUTANEOUS | Status: AC
  Administered 2018-11-05: 1 via TOPICAL
  Filled 2018-11-05: qty 1

## 2018-11-05 NOTE — ED Notes (Signed)
Pt verbalized understanding of dc instructions, vss, resp e/u, nad. Family at bedside to escort patient out of ED

## 2018-11-05 NOTE — ED Notes (Signed)
CT made aware patient is ready for transport

## 2018-11-05 NOTE — ED Provider Notes (Signed)
[facility] EMERGENCY DEPARTMENT Provider Note   CSN: [id number] Arrival date & time: 11/05/18  0350     History   Chief Complaint Chief Complaint  Patient presents with  . Fall  . Vascular Access Problem    HPI Jeffrey [name] is a 77 y.o. male.  HPI  77 year old male with history of aortic stenosis, A. fib on Eliquis, CHF, ESRD on hemodialysis comes in with chief complaint of fall.  Patient has ataxic gait however he had to go to the bathroom and had a mechanical fall the night.  [place] backwards and struck his scalp.  He started bleeding and call for help.  Patient also reports that he is having bleeding from his dialysis site.     Past Medical History:  Diagnosis Date  . Anginal pain ([place])   . Aortic stenosis    a. severe by echo 09/2014  . Atrial fibrillation ([place])    a. not well documented, not on anticoagulation  . CHF (congestive heart failure) ([place])    04/28/17 echo-EF 40%, mod LVH, diastolic dysfunction  . Claustrophobia   . Complete heart block ([place])   . COPD (chronic obstructive pulmonary disease) ([place])   . Coronary artery disease    a. chronically occluded RCA per cath 09/2014 with collaterals B. cath 05/01/17 chr occ RCA w/collaterals, 60-70% mid LAD,   . CVA (cerebral vascular accident) ([place]) 10/2014   denies residual on 07/11/2015  . ESRD (end stage renal disease) on dialysis [facility])    a. on dialysis; [facility]; MWF, LUE fistula (07/11/2015)  . History of blood transfusion    "related to gallbladder OR"  . History of stomach ulcers   . Hyperlipidemia   . Hypertension   . Iron deficiency anemia   . Myocardial infarction ([place]) 10/2014  . Peripheral vascular disease ([place])   . Pneumonia   . Presence of permanent cardiac pacemaker   . 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 Type II diabetes mellitus [facility])      Patient Active Problem List   Diagnosis Date Noted  . Chronic respiratory failure with hypoxia ([place]) 10/29/2018  . Respiratory distress 07/27/2018  . GERD (gastroesophageal reflux disease) 06/23/2018  . Cough 06/23/2018  . Chest pain 04/15/2018  . NSTEMI (non-ST elevated myocardial infarction) ([place]) 04/14/2018  . Rash 04/14/2018  . COPD GOLD 0 09/26/2017  . Atrial fibrillation ([place]) [I48.91] 05/21/2017  . Encounter for therapeutic drug monitoring 05/21/2017  . Obesity (BMI 30-39.9) 04/17/2017  . Hyponatremia 03/10/2017  . COPD with acute exacerbation ([place]) 03/10/2017  . Non-healing open wound of left groin 09/05/2015  . Automatic implantable cardioverter-defibrillator in situ 08/08/2015  . 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 Hypervolemia   . Chronic combined systolic and diastolic CHF (congestive heart failure) ([place]) 07/19/2015  . Fluid overload 07/18/2015  . Acute on chronic respiratory failure with hypoxia ([place]) 07/18/2015  . 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/2015  . Atherosclerosis of native coronary artery of native heart with angina pectoris ([place])   . Protein-calorie malnutrition, severe ([place]) 01/14/2015  . ESRD (end stage renal disease) on dialysis ([place]) 01/12/2015  . Mobitz type 1 second degree AV block   . Elevated troponin 09/30/2014  . History of stroke 09/29/2014  . Type 2 diabetes mellitus with chronic kidney disease on chronic dialysis, with long-term current use of insulin (  [place]) 09/29/2014  . CAD (coronary artery disease) 09/29/2014  . Essential hypertension   . History of tobacco use 11/21/2012  . Hyperlipidemia 01/19/2010  . Arthritis, degenerative 01/19/2010  . Allergic rhinitis 12/05/2009  . Idiopathic peripheral neuropathy 01/05/2009  . 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  . Absolute anemia 11/13/2007    Past Surgical History:  Procedure Laterality Date  . AV FISTULA PLACEMENT  Left 10/19/2014   Procedure: BRACHIOCEPHALIC ARTERIOVENOUS (AV) FISTULA CREATION ;  Surgeon: Conrad West Bishop, MD;  Location: [facility];  Service: Vascular;  Laterality: Left;  . CARDIAC CATHETERIZATION    . CARDIAC CATHETERIZATION N/A 07/22/2015   Procedure: Right/Left Heart Cath and Coronary Angiography;  Surgeon: [name], MD;  Location: [facility] CV LAB;  Service: Cardiovascular;  Laterality: N/A;  . CATARACT EXTRACTION W/ INTRAOCULAR LENS  IMPLANT, BILATERAL Bilateral 1990's  . CHOLECYSTECTOMY OPEN  1980's  . COLONOSCOPY W/ BIOPSIES AND POLYPECTOMY    . CORONARY ANGIOGRAPHY N/A 07/31/2018   Procedure: CORONARY ANGIOGRAPHY;  Surgeon: Martinique, Peter M, MD;  Location: [facility] CV LAB;  Service: Cardiovascular;  Laterality: N/A;  . CORONARY ANGIOPLASTY    . CORONARY ATHERECTOMY N/A 04/18/2018   Procedure: CORONARY ATHERECTOMY;  Surgeon: [name], MD;  Location: [facility] CV LAB;  Service: Cardiovascular;  Laterality: N/A;  . CORONARY BALLOON ANGIOPLASTY N/A 07/31/2018   Procedure: CORONARY BALLOON ANGIOPLASTY;  Surgeon: Martinique, Peter M, MD;  Location: [facility] CV LAB;  Service: Cardiovascular;  Laterality: N/A;  . CORONARY STENT INTERVENTION N/A 04/18/2018   Procedure: CORONARY STENT INTERVENTION;  Surgeon: [name], MD;  Location: [place] CV LAB;  Service: Cardiovascular;  Laterality: N/A;  . EP IMPLANTABLE DEVICE N/A 07/11/2015   Procedure: Pacemaker Implant;  Surgeon: [name], MD;  Location: [facility] CV LAB;  Service: Cardiovascular;  Laterality: N/A;  . ESOPHAGOGASTRODUODENOSCOPY  08/01/2012   Procedure: ESOPHAGOGASTRODUODENOSCOPY (EGD);  Surgeon: [name], MD;  Location: [facility];  Service: Endoscopy;  Laterality: N/A;  . INSERT / REPLACE / REMOVE PACEMAKER  07/11/2015  . INSERTION OF DIALYSIS CATHETER Right 02/02/2015   Procedure: INSERTION OF DIALYSIS CATHETER  RIGHT INTERNAL JUGULAR;  Surgeon: Mal [name], MD;  Location: [facility];  Service: Vascular;  Laterality: Right;  . LEFT AND RIGHT HEART CATHETERIZATION WITH CORONARY ANGIOGRAM N/A 09/30/2014   Procedure: LEFT AND RIGHT HEART CATHETERIZATION WITH CORONARY ANGIOGRAM;  Surgeon: [name], MD;  Location: [facility] CATH LAB;  Service: Cardiovascular;  Laterality: N/A;  . LEFT HEART CATH AND CORONARY ANGIOGRAPHY N/A 04/14/2018   Procedure: LEFT HEART CATH AND CORONARY ANGIOGRAPHY;  Surgeon: [name], MD;  Location: [facility] CV LAB;  Service: Cardiovascular;  Laterality: N/A;  . TEE WITHOUT CARDIOVERSION N/A 08/02/2015   Procedure: TRANSESOPHAGEAL ECHOCARDIOGRAM (TEE);  Surgeon: [name], MD;  Location: [facility];  Service: Open Heart Surgery;  Laterality: N/A;  . TONSILLECTOMY    . TRANSCATHETER AORTIC VALVE REPLACEMENT, TRANSFEMORAL Left 08/02/2015   Procedure: TRANSCATHETER AORTIC VALVE REPLACEMENT, TRANSFEMORAL;  Surgeon: [name], MD;  Location: [facility];  Service: Open Heart Surgery;  Laterality: Left;        Home Medications    Prior to Admission medications   Medication Sig Start Date End Date Taking? Authorizing Provider  albuterol (PROVENTIL HFA;VENTOLIN HFA) 108 (90 Base) MCG/ACT inhaler Inhale 2 puffs into the lungs every 6 (six) hours as needed for wheezing or shortness of breath. 04/19/18   Rai, Vernelle Emerald, MD  amoxicillin-clavulanate (AUGMENTIN) 875-125 MG tablet Take 1 tablet by mouth 2 (two) times daily for 10 days. 10/28/18 11/07/18  Tanda Rockers, MD  apixaban (ELIQUIS) 5 MG TABS tablet Take 1 tablet (5 mg total) by mouth 2 (two) times daily with a meal. 09/02/18   Deboraha Sprang, MD  budesonide-formoterol [facility]) 160-4.5 MCG/ACT inhaler Inhale 2 puffs into the lungs 2 (two) times daily. 10/28/18   Tanda Rockers, MD  budesonide-formoterol (SYMBICORT) 80-4.5 MCG/ACT inhaler Take 2 puffs first thing in am and then another 2 puffs about 12 hours later. 10/28/18   Tanda Rockers, MD  cinacalcet (SENSIPAR) 30 MG tablet Take 30 mg by mouth  daily with supper.    [provider]  clopidogrel (PLAVIX) 75 MG tablet Take 1 tablet (75 mg total) by mouth daily. 09/02/18   Martinique, Peter M, MD  guaiFENesin-dextromethorphan (ROBITUSSIN DM) 100-10 MG/5ML syrup Take 5 mLs by mouth every 6 (six) hours as needed for cough. 07/28/18   Arrien, Jimmy Picket, MD  insulin detemir (LEVEMIR) 100 UNIT/ML injection Inject 0.5 mLs (50 Units total) into the skin at bedtime. 04/19/18   Rai, Ripudeep K, MD  Insulin Pen Needle 31G X 6 MM MISC USE 1  THREE TIMES DAILY 08/28/18   [provider]  ipratropium-albuterol (DUONEB) 0.5-2.5 (3) MG/3ML SOLN Take 3 mLs by nebulization every 6 (six) hours as needed. 07/28/18 09/18/18  Arrien, Jimmy Picket, MD  lidocaine-prilocaine (EMLA) cream Apply 1 application topically once as needed (prior to accessing port).     [provider]  loratadine (CLARITIN) 10 MG tablet Take 1 tablet (10 mg total) by mouth daily. 09/18/18   Fenton Foy, NP  metoprolol tartrate (LOPRESSOR) 25 MG tablet Take 1 tablet (25 mg total) by mouth 2 (two) times daily. 05/07/18   Deboraha Sprang, MD  nitroGLYCERIN (NITROSTAT) 0.4 MG SL tablet Place 1 tablet (0.4 mg total) under the tongue every 5 (five) minutes as needed for chest pain. 04/19/18   Rai, Ripudeep K, MD  pantoprazole (PROTONIX) 40 MG tablet Take 1 tablet (40 mg total) by mouth daily. 08/29/18   Deboraha Sprang, MD  predniSONE (DELTASONE) 10 MG tablet Take  4 each am x 2 days,   2 each am x 2 days,  1 each am x 2 days and stop 10/28/18   Tanda Rockers, MD  Triamcinolone Acetonide (TRIAMCINOLONE 0.1 % CREAM : EUCERIN) CREA Apply 1 application topically 3 (three) times daily as needed. Apply to arms, back, and other areas with active rash/itching 04/19/18   Rai, Ripudeep K, MD  triamcinolone cream (KENALOG) 0.1 % APPLY TOPICALLY 3 TIMES DAILY TO ARMS BACK AND OTHER AREAS WITH ACTIVE RASH ITCHING 09/06/18   [provider]    Family History Family History   Problem Relation Age of Onset  . Diabetes Father   . Heart disease Father   . Diabetes Sister   . Alzheimer's disease Mother     Social History Social History   Tobacco Use  . Smoking status: Former Smoker    Packs/day: 2.00    Years: 32.00    Pack years: 64.00    Last attempt to quit: 11/27/1983    Years since quitting: 34.9  . Smokeless tobacco: Never Used  Substance Use Topics  . Alcohol use: Yes    Comment: rare  . Drug use: No     Allergies   [place] 10 mcg pen [exenatide]; Codeine; and Coumadin [warfarin sodium]  Review of Systems Review of Systems  Constitutional: Positive for activity change.  Skin: Positive for wound.  Neurological: Positive for headaches.  Hematological: Bruises/bleeds easily.  All other systems reviewed and are negative.    Physical Exam Updated Vital Signs BP (!) 157/60   Pulse (!) 53   Temp 97.6 F (36.4 C) (Oral)   Resp 17   Ht 5' 11.5" (1.816 m)   Wt 107 kg   SpO2 100%   BMI 32.46 kg/m   Physical Exam  Constitutional: He is oriented to person, place, and time. He appears well-developed.  HENT:  Head: Atraumatic.  Neck: Neck supple.  No midline c-spine tenderness, pt able to turn head to 45 degrees bilaterally without any pain and able to flex neck to the chest and extend without any pain or neurologic symptoms.  Cardiovascular: Normal rate.  Pulmonary/Chest: Effort normal.  Musculoskeletal:  Patient has a 3 cm linear laceration over his occiput.  Head to toe evaluation shows no hematoma, bleeding of the scalp, no facial abrasions, no spine step offs, crepitus of the chest or neck, no tenderness to palpation of the bilateral upper and lower extremities, no gross deformities, no chest tenderness, no pelvic pain.   Neurological: He is alert and oriented to person, place, and time.  Skin: Skin is warm.  Nursing note and vitals reviewed.    ED Treatments / Results  Labs (all labs ordered are listed, but only abnormal  results are displayed) Labs Reviewed  BASIC METABOLIC PANEL - Abnormal; Notable for the following components:      Result Value   Sodium 132 (*)    Chloride 93 (*)    Glucose, Bld 393 (*)    BUN 46 (*)    Creatinine, Ser 4.29 (*)    GFR calc non Af Amer 12 (*)    GFR calc Af Amer 14 (*)    All other components within normal limits  CBC WITH DIFFERENTIAL/PLATELET - Abnormal; Notable for the following components:   RBC 3.31 (*)    Hemoglobin 9.2 (*)    HCT 30.8 (*)    MCHC 29.9 (*)    RDW 15.9 (*)    Abs Immature Granulocytes 0.11 (*)    All other components within normal limits    EKG None  Radiology Ct Head Wo Contrast  Result Date: 11/05/2018 CLINICAL DATA:  77 year old male with head trauma. EXAM: CT HEAD WITHOUT CONTRAST TECHNIQUE: Contiguous axial images were obtained from the base of the skull through the vertex without intravenous contrast. COMPARISON:  Head CT dated 09/28/2014 FINDINGS: Brain: There is mild age-related atrophy and chronic microvascular ischemic changes. There is no acute intracranial hemorrhage. No mass effect or midline shift. No extra-axial fluid collection. Vascular: No hyperdense vessel or unexpected calcification. Skull: Normal. Negative for fracture or focal lesion. Sinuses/Orbits: No acute finding. Other: None IMPRESSION: 1. No acute intracranial hemorrhage. 2. Mild age-related atrophy and chronic microvascular ischemic changes. Electronically Signed   By: Anner Crete M.D.   On: 11/05/2018 05:49    Procedures .Marland KitchenLaceration Repair Date/Time: 11/05/2018 6:48 AM Performed by: Varney Biles, MD Authorized by: Varney Biles, MD   Consent:    Consent obtained:  Verbal   Consent given by:  Patient   Risks discussed:  Infection and pain   Alternatives discussed:  No treatment Anesthesia (see MAR for exact dosages):    Anesthesia method:  None Laceration details:    Location:  Scalp   Scalp location:  Occipital  Length (cm):  3   Depth  (mm):  2 Repair type:    Repair type:  Simple Exploration:    Contaminated: no   Treatment:    Area cleansed with:  Saline   Amount of cleaning:  Standard Skin repair:    Repair method:  Staples   Number of staples:  2 Approximation:    Approximation:  Loose Post-procedure details:    Dressing:  Sterile dressing   Patient tolerance of procedure:  Tolerated well, no immediate complications   (including critical care time)  Medications Ordered in ED Medications  THROMBI-PAD 3"X3" pad 1 each (1 each Topical Given 11/05/18 0516)     Initial Impression / Assessment and Plan / ED Course  I have reviewed the triage vital signs and the nursing notes.  Pertinent labs & imaging results that were available during my care of the patient were reviewed by me and considered in my medical decision making (see chart for details).     Patient comes in with chief complaint of fall and resultant scalp laceration.  The laceration has been repaired with staples. Patient's bleeding is controlled with Surgicel dressing. CT had ordered because he is on blood thinner and it is negative.  C-spine has been cleared clinically.  I have ordered a bedside commode and given patient appropriate outpatient follow-up.  Final Clinical Impressions(s) / ED Diagnoses   Final diagnoses:  Injury of head, initial encounter  Fall, initial encounter    ED Discharge Orders         Ordered    DME Bedside commode     11/05/18 [place], [place], MD 11/05/18 [phone]

## 2018-11-05 NOTE — Discharge Instructions (Addendum)
You are seen in the ER after he had a fall. We have stapled your scalp.  Please see your primary care doctor in 7 to 10 days to remove the staples.  I have emailed our social worker to see if they can help you with the bedside commode.

## 2018-11-05 NOTE — ED Triage Notes (Addendum)
Pt had an unwitnessed fall walking to the bathroom and hit the back of his head on the door case. Pt has a 2 inch lac to the back of his head, bleeding controlled. Pt reports nausea and a 8/10 headache. Pt is a dialysis pt MWF, pt reports having treatments the last two days plus is supposed to go back again this am. Pt reports his fistula has been bleeding since Monday. Pt takes Plavix. Pt wears 3L  O2 baseline. Pt baseline ambulatory status has declined at home to almost wheelchair bound with assistance.

## 2018-11-05 NOTE — ED Notes (Signed)
ED Provider at bedside. 

## 2018-11-09 NOTE — Progress Notes (Deleted)
Cardiology Office Note Date:  11/09/2018  Patient ID:  Jeffrey [name], Jeffrey [name] 1941-02-03, MRN [id number] PCP:  Thurman Coyer, MD  Cardiologist:  Dr. Caryl Comes  ***refresh   Chief Complaint: 3 mo f/u  History of Present Illness: Jeffrey [name] is a 77 y.o. 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  CAD hx:  in May of this year, pt was admitted for NSTEMI. Troponin that admission peaked at 0.53. Cardiac cath on 04/14/18 showed significant multivessel coronary calcification with 3 vessel CAD, and 60% proximal LAD stenosis, 70% calcified stenosis between the first and second diagonal vessel; dominant left circumflex coronary artery with new 90-95% ostial stenosis and diffuse 30% mid stenosis; total chronic mid RCA occlusion. There was left to right collateralization to the distal RCA. CT surgery was consulted for consideration for CABG however he was seen by Dr. Roxy Manns and was felt too high risk for surgical revascularization. Subsequently, he was taken back to the cath lab on 04/18/18 and underwent Successful PTCA/orbital atherectomy/DES x 1 proximal to mid LAD and PTCA/orbital atherectomy/DES x 1 ostium Circumflex. He was placed on DAPT w/ ASA + Plavix.  Readmitted 07/30/18 with recurrent CP, ruled in for NSTEMI. Re-cathed >> w/ DES oCFX, med rx for 100% RCA  He has also been hospitalized of late (Aug 2018) for COPD exacerbation, requires continuous home O2.  He comes in today to be seen for Dr. Caryl Comes.  He last saw him in Sept.  He was in AF, his note mentions plans to discuss Eliquis for a/c with Dr. Lorrene Reid (nephrologist), though stated plans after one month of DAPT to add Plavix (and presumably stop ASA).  Today's visit is a planned 3 month follow up.   *** symptoms *** meds, CM, CAD, ?eliquis *** labs? *** fluid status/dialysis *** bleeding *** refer for Dr. Martinique?  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 Anginal pain ([place])   . Aortic stenosis    a. severe by echo 09/2014  . Atrial fibrillation ([place])    a. not well documented, not on anticoagulation  . CHF (congestive heart failure) ([place])    04/28/17 echo-EF 40%, mod LVH, diastolic dysfunction  . Claustrophobia   . Complete heart block ([place])   . COPD (chronic obstructive pulmonary disease) ([place])   . Coronary artery disease    a. chronically occluded RCA per cath 09/2014 with collaterals B. cath 05/01/17 chr occ RCA w/collaterals, 60-70% mid LAD,   . CVA (cerebral vascular accident) ([place]) 10/2014   denies residual on 07/11/2015  . ESRD (end stage renal disease) on dialysis [facility])    a. on dialysis; [facility]; MWF, LUE fistula (07/11/2015)  . History of blood transfusion    "related to gallbladder OR"  . History of stomach ulcers   . Hyperlipidemia   . Hypertension   . Iron deficiency anemia   . Myocardial infarction ([place]) 10/2014  . Peripheral vascular disease ([place])   . Pneumonia   . Presence of permanent cardiac pacemaker   . 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 Type II diabetes mellitus ([place])     Past Surgical History:  Procedure Laterality Date  . AV FISTULA PLACEMENT Left 10/19/2014  Procedure: BRACHIOCEPHALIC ARTERIOVENOUS (AV) FISTULA CREATION ;  Surgeon: Conrad Troxelville, MD;  Location: [place];  Service: Vascular;  Laterality: Left;  . CARDIAC CATHETERIZATION    . CARDIAC CATHETERIZATION N/A 07/22/2015   Procedure: Right/Left Heart Cath and Coronary Angiography;  Surgeon: [name], MD;  Location: [place] CV LAB;  Service: Cardiovascular;  Laterality: N/A;  . CATARACT EXTRACTION W/ INTRAOCULAR LENS  IMPLANT, BILATERAL Bilateral 1990's  . CHOLECYSTECTOMY OPEN  1980's  . COLONOSCOPY W/  BIOPSIES AND POLYPECTOMY    . CORONARY ANGIOGRAPHY N/A 07/31/2018   Procedure: CORONARY ANGIOGRAPHY;  Surgeon: Martinique, Peter M, MD;  Location: [place] CV LAB;  Service: Cardiovascular;  Laterality: N/A;  . CORONARY ANGIOPLASTY    . CORONARY ATHERECTOMY N/A 04/18/2018   Procedure: CORONARY ATHERECTOMY;  Surgeon: [name], MD;  Location: [place] CV LAB;  Service: Cardiovascular;  Laterality: N/A;  . CORONARY BALLOON ANGIOPLASTY N/A 07/31/2018   Procedure: CORONARY BALLOON ANGIOPLASTY;  Surgeon: Martinique, Peter M, MD;  Location: [place] CV LAB;  Service: Cardiovascular;  Laterality: N/A;  . CORONARY STENT INTERVENTION N/A 04/18/2018   Procedure: CORONARY STENT INTERVENTION;  Surgeon: [name], MD;  Location: [place] CV LAB;  Service: Cardiovascular;  Laterality: N/A;  . EP IMPLANTABLE DEVICE N/A 07/11/2015   Procedure: Pacemaker Implant;  Surgeon: [name], MD;  Location: [place] CV LAB;  Service: Cardiovascular;  Laterality: N/A;  . ESOPHAGOGASTRODUODENOSCOPY  08/01/2012   Procedure: ESOPHAGOGASTRODUODENOSCOPY (EGD);  Surgeon: [name], MD;  Location: [facility];  Service: Endoscopy;  Laterality: N/A;  . INSERT / REPLACE / REMOVE PACEMAKER  07/11/2015  . INSERTION OF DIALYSIS CATHETER Right 02/02/2015   Procedure: INSERTION OF DIALYSIS CATHETER  RIGHT INTERNAL JUGULAR;  Surgeon: Mal [name], MD;  Location: [place];  Service: Vascular;  Laterality: Right;  . LEFT AND RIGHT HEART CATHETERIZATION WITH CORONARY ANGIOGRAM N/A 09/30/2014   Procedure: LEFT AND RIGHT HEART CATHETERIZATION WITH CORONARY ANGIOGRAM;  Surgeon: [name], MD;  Location: [facility] CATH LAB;  Service: Cardiovascular;  Laterality: N/A;  . LEFT HEART CATH AND CORONARY ANGIOGRAPHY N/A 04/14/2018   Procedure: LEFT HEART CATH AND CORONARY ANGIOGRAPHY;  Surgeon: [name], MD;  Location: [place] CV LAB;  Service: Cardiovascular;  Laterality: N/A;  . TEE WITHOUT CARDIOVERSION  N/A 08/02/2015   Procedure: TRANSESOPHAGEAL ECHOCARDIOGRAM (TEE);  Surgeon: [name], MD;  Location: [place];  Service: Open Heart Surgery;  Laterality: N/A;  . TONSILLECTOMY    . TRANSCATHETER AORTIC VALVE REPLACEMENT, TRANSFEMORAL Left 08/02/2015   Procedure: TRANSCATHETER AORTIC VALVE REPLACEMENT, TRANSFEMORAL;  Surgeon: [name], MD;  Location: [place];  Service: Open Heart Surgery;  Laterality: Left;    Current Outpatient Medications  Medication Sig Dispense Refill  . albuterol (PROVENTIL HFA;VENTOLIN HFA) 108 (90 Base) MCG/ACT inhaler Inhale 2 puffs into the lungs every 6 (six) hours as needed for wheezing or shortness of breath. 1 Inhaler 2  . apixaban (ELIQUIS) 5 MG TABS tablet Take 1 tablet (5 mg total) by mouth 2 (two) times daily with a meal. 180 tablet 3  . budesonide-formoterol (SYMBICORT) 160-4.5 MCG/ACT inhaler Inhale 2 puffs into the lungs 2 (two) times daily. 1 Inhaler 0  . budesonide-formoterol (SYMBICORT) 80-4.5 MCG/ACT inhaler Take 2 puffs first thing in am and then another 2 puffs about 12 hours later. 1 Inhaler 11  . cinacalcet (SENSIPAR) 30 MG tablet Take 30 mg by mouth daily with supper.    . clopidogrel (PLAVIX)  75 MG tablet Take 1 tablet (75 mg total) by mouth daily. 90 tablet 3  . guaiFENesin-dextromethorphan (ROBITUSSIN DM) 100-10 MG/5ML syrup Take 5 mLs by mouth every 6 (six) hours as needed for cough. 118 mL 0  . insulin detemir (LEVEMIR) 100 UNIT/ML injection Inject 0.5 mLs (50 Units total) into the skin at bedtime. 10 mL 2  . Insulin Pen Needle 31G X 6 MM MISC USE 1  THREE TIMES DAILY    . ipratropium-albuterol (DUONEB) 0.5-2.5 (3) MG/3ML SOLN Take 3 mLs by nebulization every 6 (six) hours as needed. 360 mL 0  . lidocaine-prilocaine (EMLA) cream Apply 1 application topically once as needed (prior to accessing port).     . loratadine (CLARITIN) 10 MG tablet Take 1 tablet (10 mg total) by mouth daily. 30 tablet 11  . metoprolol tartrate (LOPRESSOR) 25 MG tablet  Take 1 tablet (25 mg total) by mouth 2 (two) times daily. 180 tablet 3  . nitroGLYCERIN (NITROSTAT) 0.4 MG SL tablet Place 1 tablet (0.4 mg total) under the tongue every 5 (five) minutes as needed for chest pain. 30 tablet 12  . pantoprazole (PROTONIX) 40 MG tablet Take 1 tablet (40 mg total) by mouth daily. 90 tablet 3  . predniSONE (DELTASONE) 10 MG tablet Take  4 each am x 2 days,   2 each am x 2 days,  1 each am x 2 days and stop 14 tablet 0  . Triamcinolone Acetonide (TRIAMCINOLONE 0.1 % CREAM : EUCERIN) CREA Apply 1 application topically 3 (three) times daily as needed. Apply to arms, back, and other areas with active rash/itching 1 each 1  . triamcinolone cream (KENALOG) 0.1 % APPLY TOPICALLY 3 TIMES DAILY TO ARMS BACK AND OTHER AREAS WITH ACTIVE RASH ITCHING  1   No current facility-administered medications for this visit.     Allergies:   Byetta 10 mcg pen [exenatide]; Codeine; and Coumadin [warfarin sodium]   Social History:  The patient  reports that he quit smoking about 34 years ago. He has a 64.00 pack-year smoking history. He has never used smokeless tobacco. He reports current alcohol use. He reports that he does not use drugs.   Family History:  The patient's family history includes Alzheimer's disease in his mother; Diabetes in his father and sister; Heart disease in his father.  ROS:  Please see the history of present illness.    All other systems are reviewed and otherwise negative.   PHYSICAL EXAM: *** VS:  There were no vitals taken for this visit. BMI: There is no height or weight on file to calculate BMI. Well nourished, well developed, in no acute distress  HEENT: normocephalic, atraumatic  Neck: no JVD, carotid bruits or masses Cardiac:  *** irreg/RRR; no significant murmurs, no rubs, or gallops Lungs:  *** CTA b/l, no wheezing, rhonchi or rales  Abd: soft, nontender MS: no deformity or *** atrophy Ext: *** no edema  Skin: warm and dry, no rash Neuro:  No gross  deficits appreciated Psych: euthymic mood, full affect  *** PPM site is stable, no tethering or discomfort   EKG:  Not done today PPM interrogation done today and reviewed by myself: ***   07/31/18: LHC/PCI (Dr. Martinique)  Colon Flattery 2nd Diag lesion is 70% stenosed.  Previously placed Prox LAD-2 drug eluting stent is widely patent.  Previously placed Prox LAD-1 drug eluting stent is widely patent.  Prox Cx to Mid Cx lesion is 30% stenosed.  Prox RCA to Dist RCA lesion  is 100% stenosed.  Ost Cx lesion is 95% stenosed.  Post intervention, there is a 10% residual stenosis.  Scoring balloon angioplasty was performed using a BALLOON WOLVERINE 4.00X10.  Balloon angioplasty was performed using a BALLOON [place] EMERGE MR 4.5X12. 1. Severe 2 vessel obstructive CAD    - 95% in stent restenosis of the ostial LCx    - 100% CTO of the RCA 2. Stent in the proximal to mid LAD is patent 3. Successful scoring balloon/PTCA of the ostial LCx  04/18/18: LHC/PCI  Prox LAD-1 lesion is 60% stenosed.  Prox LAD-2 lesion is 70% stenosed.  Ost 2nd Diag lesion is 70% stenosed.  Ost Cx lesion is 95% stenosed.  Prox Cx to Mid Cx lesion is 30% stenosed.  Prox RCA to Dist RCA lesion is 100% stenosed.  A drug-eluting stent was successfully placed using a STENT SYNERGY DES 3X16.  Post intervention, there is a 0% residual stenosis.  A drug-eluting stent was successfully placed using a STENT SYNERGY DES 3X38.  Post intervention, there is a 0% residual stenosis.  Post intervention, there is a 0% residual stenosis. 1. Severe stenosis proximal to mid LAD 2. Successful PTCA/orbital atherectomy/DES x 1 proximal to mid LAD 3. Severe stenosis ostial Circumflex artery 4. Successful PTCA/orbital atherectomy/DES x 1 ostium Circumflex Recommendations: Will continue DAPT with ASA and Plavix for one year.    04/17/18: TTE Study Conclusions - Left ventricle: The cavity size was normal. There was moderate    concentric hypertrophy. Systolic function was mildly reduced. The   estimated ejection fraction was in the range of 45% to 50%. There   is akinesis of the apicalanterior, lateral, inferior, and apical   myocardium. The study is not technically sufficient to allow   evaluation of LV diastolic function. - Aortic valve: Bioprosthetic valve s/p TAVR with normal valve   function. There was mild-moderate perivalvular regurgitation.   Mean gradient (S): 8 mm Hg. Valve area (VTI): 1.25 cm^2. Valve   area (Vmax): 1.08 cm^2. Valve area (Vmean): 1.12 cm^2.   Regurgitation pressure half-time: 367 ms. - Mitral valve: Severely calcified annulus. There was mild   regurgitation. - Left atrium: The atrium was severely dilated. - Pulmonary arteries: Systolic pressure could not be accurately   estimated. - Inferior vena cava: The vessel was dilated. The respirophasic   diameter changes were in the normal range (>= 50%). - Pericardium, extracardiac: A trivial, free-flowing pericardial   effusion was identified circumferential to the heart.   Recent Labs: 04/14/2018: TSH 3.286 07/30/2018: ALT 14; B Natriuretic Peptide 3,146.0 11/05/2018: BUN 46; Creatinine, Ser 4.29; Hemoglobin 9.2; Platelets 210; Potassium 4.5; Sodium 132  04/15/2018: Cholesterol 80; HDL 22; LDL Cholesterol 22; Total CHOL/HDL Ratio 3.6; Triglycerides 179; VLDL 36   Estimated Creatinine Clearance: 17.5 mL/min (A) (by C-G formula based on SCr of 4.29 mg/dL (H)).   Wt Readings from Last 3 Encounters:  11/05/18 236 lb (107 kg)  10/28/18 236 lb (107 kg)  09/18/18 236 lb (107 kg)     Other studies reviewed: Additional studies/records reviewed today include: summarized above  ASSESSMENT AND PLAN:  1. Severe CAD 2. 2-NSTEMI's this year with intervention     CTS consultation, felt to high risk for CABG     ***     *** may benefit from general/interventional cards team in addition to EP  3. PPM     ***  4. *** AFib     CHA2DS2Vasc  is 7     ***  5. HTN     ***  6. Mild CM     Fluid management with HD    ***  7. HLD     ***    Disposition: F/u with ***  Current medicines are reviewed at length with the patient today.  The patient did not have any concerns regarding medicines.***  Signed, Tommye Standard, PA-C 11/09/2018 9:52 AM     [facility] [address] [place] [id number] [phone] (office)  [phone] (fax)

## 2018-11-11 ENCOUNTER — Encounter: Payer: Medicare Other | Admitting: Physician Assistant

## 2018-11-21 ENCOUNTER — Other Ambulatory Visit: Payer: Self-pay

## 2018-11-21 ENCOUNTER — Encounter: Payer: Self-pay | Admitting: Family Medicine

## 2018-11-21 ENCOUNTER — Encounter (HOSPITAL_COMMUNITY): Payer: Self-pay | Admitting: Emergency Medicine

## 2018-11-21 ENCOUNTER — Emergency Department (HOSPITAL_COMMUNITY)
Admission: EM | Admit: 2018-11-21 | Discharge: 2018-11-21 | Disposition: A | Discharge status: Home / Self Care | Payer: Medicare Other | Attending: Emergency Medicine | Admitting: Emergency Medicine

## 2018-11-21 DIAGNOSIS — Z4802 Encounter for removal of sutures: Secondary | ICD-10-CM | POA: Insufficient documentation

## 2018-11-21 NOTE — ED Notes (Signed)
Pt verbalized understanding of discharge paperwork and follow-up care.  °

## 2018-11-21 NOTE — ED Provider Notes (Signed)
[facility] EMERGENCY DEPARTMENT Provider Note   CSN: [id number] Arrival date & time: 11/21/18  1052     History   Chief Complaint Chief Complaint  Patient presents with  . Suture / Staple Removal    HPI Jeffrey [name] is a 77 y.o. male.  77 yo M   The history is provided by the patient.  Suture / Staple Removal  This is a new problem. The current episode started more than 1 week ago. The problem occurs constantly. The problem has not changed since onset.Pertinent negatives include no chest pain, no abdominal pain, no headaches and no shortness of breath. Nothing aggravates the symptoms. Nothing relieves the symptoms. He has tried nothing for the symptoms. The treatment provided no relief.    Past Medical History:  Diagnosis Date  . Anginal pain ([place])   . Aortic stenosis    a. severe by echo 09/2014  . Atrial fibrillation ([place])    a. not well documented, not on anticoagulation  . CHF (congestive heart failure) ([place])    04/28/17 echo-EF 40%, mod LVH, diastolic dysfunction  . Claustrophobia   . Complete heart block ([place])   . COPD (chronic obstructive pulmonary disease) ([place])   . Coronary artery disease    a. chronically occluded RCA per cath 09/2014 with collaterals B. cath 05/01/17 chr occ RCA w/collaterals, 60-70% mid LAD,   . CVA (cerebral vascular accident) ([place]) 10/2014   denies residual on 07/11/2015  . ESRD (end stage renal disease) on dialysis [facility])    a. on dialysis; [facility]; MWF, LUE fistula (07/11/2015)  . History of blood transfusion    "related to gallbladder OR"  . History of stomach ulcers   . Hyperlipidemia   . Hypertension   . Iron deficiency anemia   . Myocardial infarction ([place]) 10/2014  . Peripheral vascular disease ([place])   . Pneumonia   . Presence of permanent cardiac pacemaker   . 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 Type II diabetes mellitus [facility])     Patient Active Problem List   Diagnosis Date Noted  . Chronic respiratory failure with hypoxia ([place]) 10/29/2018  . Respiratory distress 07/27/2018  . GERD (gastroesophageal reflux disease) 06/23/2018  . Cough 06/23/2018  . Chest pain 04/15/2018  . NSTEMI (non-ST elevated myocardial infarction) ([place]) 04/14/2018  . Rash 04/14/2018  . COPD GOLD 0 09/26/2017  . Atrial fibrillation ([place]) [I48.91] 05/21/2017  . Encounter for therapeutic drug monitoring 05/21/2017  . Obesity (BMI 30-39.9) 04/17/2017  . Hyponatremia 03/10/2017  . COPD with acute exacerbation ([place]) 03/10/2017  . Non-healing open wound of left groin 09/05/2015  . Automatic implantable cardioverter-defibrillator in situ 08/08/2015  . 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 Hypervolemia   . Chronic combined systolic and diastolic CHF (congestive heart failure) ([place]) 07/19/2015  . Fluid overload 07/18/2015  . Acute on chronic respiratory failure with hypoxia ([place]) 07/18/2015  . 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/2015  . Atherosclerosis of native coronary artery of native heart with angina pectoris ([place])   . Protein-calorie malnutrition, severe ([place]) 01/14/2015  . ESRD (end stage renal disease) on dialysis ([place]) 01/12/2015  . Mobitz type 1 second degree AV block   . Elevated troponin 09/30/2014  . History of stroke 09/29/2014  . Type 2 diabetes mellitus with chronic kidney disease on chronic dialysis, with long-term current  use of insulin ([place]) 09/29/2014  . CAD (coronary artery disease) 09/29/2014  . Essential hypertension   . History of tobacco use 11/21/2012  . Hyperlipidemia 01/19/2010  . Arthritis, degenerative 01/19/2010  . Allergic rhinitis 12/05/2009  . Idiopathic peripheral neuropathy 01/05/2009  . 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  . Absolute anemia 11/13/2007    Past Surgical  History:  Procedure Laterality Date  . AV FISTULA PLACEMENT Left 10/19/2014   Procedure: BRACHIOCEPHALIC ARTERIOVENOUS (AV) FISTULA CREATION ;  Surgeon: Conrad Newry, MD;  Location: [facility];  Service: Vascular;  Laterality: Left;  . CARDIAC CATHETERIZATION    . CARDIAC CATHETERIZATION N/A 07/22/2015   Procedure: Right/Left Heart Cath and Coronary Angiography;  Surgeon: [name], MD;  Location: [facility] CV LAB;  Service: Cardiovascular;  Laterality: N/A;  . CATARACT EXTRACTION W/ INTRAOCULAR LENS  IMPLANT, BILATERAL Bilateral 1990's  . CHOLECYSTECTOMY OPEN  1980's  . COLONOSCOPY W/ BIOPSIES AND POLYPECTOMY    . CORONARY ANGIOGRAPHY N/A 07/31/2018   Procedure: CORONARY ANGIOGRAPHY;  Surgeon: Martinique, Peter M, MD;  Location: [facility] CV LAB;  Service: Cardiovascular;  Laterality: N/A;  . CORONARY ANGIOPLASTY    . CORONARY ATHERECTOMY N/A 04/18/2018   Procedure: CORONARY ATHERECTOMY;  Surgeon: [name], MD;  Location: [facility] CV LAB;  Service: Cardiovascular;  Laterality: N/A;  . CORONARY BALLOON ANGIOPLASTY N/A 07/31/2018   Procedure: CORONARY BALLOON ANGIOPLASTY;  Surgeon: Martinique, Peter M, MD;  Location: [facility] CV LAB;  Service: Cardiovascular;  Laterality: N/A;  . CORONARY STENT INTERVENTION N/A 04/18/2018   Procedure: CORONARY STENT INTERVENTION;  Surgeon: [name], MD;  Location: [facility] CV LAB;  Service: Cardiovascular;  Laterality: N/A;  . EP IMPLANTABLE DEVICE N/A 07/11/2015   Procedure: Pacemaker Implant;  Surgeon: [name], MD;  Location: [facility] LAB;  Service: Cardiovascular;  Laterality: N/A;  . ESOPHAGOGASTRODUODENOSCOPY  08/01/2012   Procedure: ESOPHAGOGASTRODUODENOSCOPY (EGD);  Surgeon: [name], MD;  Location: [facility];  Service: Endoscopy;  Laterality: N/A;  . INSERT / REPLACE / REMOVE PACEMAKER  07/11/2015  . INSERTION OF DIALYSIS CATHETER Right 02/02/2015   Procedure: INSERTION OF DIALYSIS CATHETER  RIGHT  INTERNAL JUGULAR;  Surgeon: Mal [name], MD;  Location: [facility];  Service: Vascular;  Laterality: Right;  . LEFT AND RIGHT HEART CATHETERIZATION WITH CORONARY ANGIOGRAM N/A 09/30/2014   Procedure: LEFT AND RIGHT HEART CATHETERIZATION WITH CORONARY ANGIOGRAM;  Surgeon: [name], MD;  Location: [facility] CATH LAB;  Service: Cardiovascular;  Laterality: N/A;  . LEFT HEART CATH AND CORONARY ANGIOGRAPHY N/A 04/14/2018   Procedure: LEFT HEART CATH AND CORONARY ANGIOGRAPHY;  Surgeon: [name], MD;  Location: [facility] CV LAB;  Service: Cardiovascular;  Laterality: N/A;  . TEE WITHOUT CARDIOVERSION N/A 08/02/2015   Procedure: TRANSESOPHAGEAL ECHOCARDIOGRAM (TEE);  Surgeon: [name], MD;  Location: [facility];  Service: Open Heart Surgery;  Laterality: N/A;  . TONSILLECTOMY    . TRANSCATHETER AORTIC VALVE REPLACEMENT, TRANSFEMORAL Left 08/02/2015   Procedure: TRANSCATHETER AORTIC VALVE REPLACEMENT, TRANSFEMORAL;  Surgeon: [name], MD;  Location: [facility];  Service: Open Heart Surgery;  Laterality: Left;        Home Medications    Prior to Admission medications   Medication Sig Start Date End Date Taking? Authorizing Provider  albuterol (PROVENTIL HFA;VENTOLIN HFA) 108 (90 Base) MCG/ACT inhaler Inhale 2 puffs into the lungs every 6 (six) hours as needed for wheezing or shortness of breath. 04/19/18   Rai,  Ripudeep K, MD  apixaban (ELIQUIS) 5 MG TABS tablet Take 1 tablet (5 mg total) by mouth 2 (two) times daily with a meal. 09/02/18   Deboraha Sprang, MD  budesonide-formoterol [facility]) 160-4.5 MCG/ACT inhaler Inhale 2 puffs into the lungs 2 (two) times daily. 10/28/18   Tanda Rockers, MD  budesonide-formoterol (SYMBICORT) 80-4.5 MCG/ACT inhaler Take 2 puffs first thing in am and then another 2 puffs about 12 hours later. 10/28/18   Tanda Rockers, MD  cinacalcet (SENSIPAR) 30 MG tablet Take 30 mg by mouth daily with supper.    [provider]  clopidogrel (PLAVIX) 75 MG tablet Take 1  tablet (75 mg total) by mouth daily. 09/02/18   Martinique, Peter M, MD  guaiFENesin-dextromethorphan (ROBITUSSIN DM) 100-10 MG/5ML syrup Take 5 mLs by mouth every 6 (six) hours as needed for cough. 07/28/18   Arrien, Jimmy Picket, MD  insulin detemir (LEVEMIR) 100 UNIT/ML injection Inject 0.5 mLs (50 Units total) into the skin at bedtime. 04/19/18   Rai, Ripudeep K, MD  Insulin Pen Needle 31G X 6 MM MISC USE 1  THREE TIMES DAILY 08/28/18   [provider]  ipratropium-albuterol (DUONEB) 0.5-2.5 (3) MG/3ML SOLN Take 3 mLs by nebulization every 6 (six) hours as needed. 07/28/18 09/18/18  Arrien, Jimmy Picket, MD  lidocaine-prilocaine (EMLA) cream Apply 1 application topically once as needed (prior to accessing port).     [provider]  loratadine (CLARITIN) 10 MG tablet Take 1 tablet (10 mg total) by mouth daily. 09/18/18   Fenton Foy, NP  metoprolol tartrate (LOPRESSOR) 25 MG tablet Take 1 tablet (25 mg total) by mouth 2 (two) times daily. 05/07/18   Deboraha Sprang, MD  nitroGLYCERIN (NITROSTAT) 0.4 MG SL tablet Place 1 tablet (0.4 mg total) under the tongue every 5 (five) minutes as needed for chest pain. 04/19/18   Rai, Ripudeep K, MD  pantoprazole (PROTONIX) 40 MG tablet Take 1 tablet (40 mg total) by mouth daily. 08/29/18   Deboraha Sprang, MD  predniSONE (DELTASONE) 10 MG tablet Take  4 each am x 2 days,   2 each am x 2 days,  1 each am x 2 days and stop 10/28/18   Tanda Rockers, MD  Triamcinolone Acetonide (TRIAMCINOLONE 0.1 % CREAM : EUCERIN) CREA Apply 1 application topically 3 (three) times daily as needed. Apply to arms, back, and other areas with active rash/itching 04/19/18   Rai, Ripudeep K, MD  triamcinolone cream (KENALOG) 0.1 % APPLY TOPICALLY 3 TIMES DAILY TO ARMS BACK AND OTHER AREAS WITH ACTIVE RASH ITCHING 09/06/18   [provider]    Family History Family History  Problem Relation Age of Onset  . Diabetes Father   . Heart disease Father   .  Diabetes Sister   . Alzheimer's disease Mother     Social History Social History   Tobacco Use  . Smoking status: Former Smoker    Packs/day: 2.00    Years: 32.00    Pack years: 64.00    Last attempt to quit: 11/27/1983    Years since quitting: 35.0  . Smokeless tobacco: Never Used  Substance Use Topics  . Alcohol use: Yes    Comment: rare  . Drug use: No     Allergies   Byetta 10 mcg pen [exenatide]; Codeine; and Coumadin [warfarin sodium]   Review of Systems Review of Systems  Constitutional: Negative for chills and fever.  HENT: Negative for congestion and facial swelling.  Eyes: Negative for discharge and visual disturbance.  Respiratory: Negative for shortness of breath.   Cardiovascular: Negative for chest pain and palpitations.  Gastrointestinal: Negative for abdominal pain, diarrhea and vomiting.  Musculoskeletal: Negative for arthralgias and myalgias.  Skin: Positive for wound. Negative for color change and rash.  Neurological: Negative for tremors, syncope and headaches.  Psychiatric/Behavioral: Negative for confusion and dysphoric mood.     Physical Exam Updated Vital Signs BP (!) 128/50   Pulse 68   Temp 98.9 F (37.2 C) (Oral)   Resp 16   Ht 5\' 11"  (1.803 m)   Wt 107 kg   SpO2 98%   BMI 32.92 kg/m   Physical Exam Vitals signs and nursing note reviewed.  Constitutional:      Appearance: He is well-developed.  HENT:     Head: Normocephalic.     Comments: 3cm lac to occiput, clean dry and intact, no noted drainage, no erythema Eyes:     Pupils: Pupils are equal, round, and reactive to light.  Neck:     Musculoskeletal: Normal range of motion and neck supple.     Vascular: No JVD.  Cardiovascular:     Rate and Rhythm: Normal rate and regular rhythm.     Heart sounds: No murmur. No friction rub. No gallop.   Pulmonary:     Effort: No respiratory distress.     Breath sounds: No wheezing.  Abdominal:     General: There is no distension.      Tenderness: There is no guarding or rebound.  Musculoskeletal: Normal range of motion.  Skin:    Coloration: Skin is not pale.     Findings: No rash.  Neurological:     Mental Status: He is alert and oriented to person, place, and time.  Psychiatric:        Behavior: Behavior normal.      ED Treatments / Results  Labs (all labs ordered are listed, but only abnormal results are displayed) Labs Reviewed - No data to display  EKG None  Radiology No results found.  Procedures .Suture Removal Date/Time: 11/21/2018 11:31 AM Performed by: Deno Etienne, DO Authorized by: Deno Etienne, DO   Consent:    Consent obtained:  Verbal   Consent given by:  Patient   Risks discussed:  Bleeding, wound separation and pain   Alternatives discussed:  No treatment, delayed treatment, alternative treatment and observation Location:    Location:  Head/neck   Head/neck location:  Scalp Procedure details:    Wound appearance:  No signs of infection, good wound healing, nonpurulent and nontender   Number of staples removed:  2 Post-procedure details:    Post-removal:  No dressing applied   Patient tolerance of procedure:  Tolerated well, no immediate complications   (including critical care time)  Medications Ordered in ED Medications - No data to display   Initial Impression / Assessment and Plan / ED Course  I have reviewed the triage vital signs and the nursing notes.  Pertinent labs & imaging results that were available during my care of the patient were reviewed by me and considered in my medical decision making (see chart for details).     77 yo M with a cc of need for suture removal.  Wound without concerning exam.  Removed without difficulty.  D/c home.   11:32 AM:  I have discussed the diagnosis/risks/treatment options with the patient and family and believe the pt to be eligible for discharge home to follow-up  with PCP. We also discussed returning to the ED immediately if new  or worsening sx occur. We discussed the sx which are most concerning (e.g., sudden worsening pain, fever, inability to tolerate by mouth) that necessitate immediate return. Medications administered to the patient during their visit and any new prescriptions provided to the patient are listed below.  Medications given during this visit Medications - No data to display    The patient appears reasonably screen and/or stabilized for discharge and I doubt any other medical condition or other [facility] requiring further screening, evaluation, or treatment in the ED at this time prior to discharge.    Final Clinical Impressions(s) / ED Diagnoses   Final diagnoses:  Encounter for staple removal    ED Discharge Orders    None       Deno Etienne, DO 11/21/18 1132

## 2018-11-21 NOTE — ED Notes (Signed)
Pt ready for discharge. In wheelchair and dressed.

## 2018-11-21 NOTE — ED Triage Notes (Signed)
Pt. Stated, Im here for staple removal, back of head 11/05/2018

## 2018-11-25 ENCOUNTER — Ambulatory Visit: Payer: Medicare Other | Admitting: Family Medicine

## 2018-11-27 ENCOUNTER — Ambulatory Visit (INDEPENDENT_AMBULATORY_CARE_PROVIDER_SITE_OTHER): Payer: Medicare Other | Admitting: Internal Medicine

## 2018-11-27 ENCOUNTER — Ambulatory Visit (INDEPENDENT_AMBULATORY_CARE_PROVIDER_SITE_OTHER): Payer: Medicare Other | Admitting: Gastroenterology

## 2018-11-27 ENCOUNTER — Encounter: Payer: Self-pay | Admitting: Internal Medicine

## 2018-11-27 ENCOUNTER — Ambulatory Visit (INDEPENDENT_AMBULATORY_CARE_PROVIDER_SITE_OTHER)
Admission: RE | Admit: 2018-11-27 | Discharge: 2018-11-27 | Disposition: A | Discharge status: Home / Self Care | Payer: Medicare Other | Source: Ambulatory Visit | Attending: Internal Medicine | Admitting: Internal Medicine

## 2018-11-27 ENCOUNTER — Encounter: Payer: Self-pay | Admitting: Gastroenterology

## 2018-11-27 VITALS — BP 100/44 | HR 54 | Ht 68.0 in | Wt 210.0 lb

## 2018-11-27 VITALS — BP 130/50 | HR 52

## 2018-11-27 DIAGNOSIS — Z7901 Long term (current) use of anticoagulants: Secondary | ICD-10-CM

## 2018-11-27 DIAGNOSIS — Z9981 Dependence on supplemental oxygen: Secondary | ICD-10-CM | POA: Diagnosis not present

## 2018-11-27 DIAGNOSIS — R1310 Dysphagia, unspecified: Secondary | ICD-10-CM

## 2018-11-27 DIAGNOSIS — J449 Chronic obstructive pulmonary disease, unspecified: Secondary | ICD-10-CM

## 2018-11-27 DIAGNOSIS — K219 Gastro-esophageal reflux disease without esophagitis: Secondary | ICD-10-CM

## 2018-11-27 DIAGNOSIS — J9611 Chronic respiratory failure with hypoxia: Secondary | ICD-10-CM

## 2018-11-27 DIAGNOSIS — I10 Essential (primary) hypertension: Secondary | ICD-10-CM

## 2018-11-27 LAB — PULMONARY FUNCTION TEST
FEF 25-75 Post: 1.8 L/sec
FEF 25-75 Pre: 1.23 L/sec
FEF2575-%Change-Post: 46 %
FEF2575-%Pred-Post: 97 %
FEF2575-%Pred-Pre: 66 %
FEV1-%CHANGE-POST: 13 %
FEV1-%Pred-Post: 47 %
FEV1-%Pred-Pre: 41 %
FEV1-Post: 1.25 L
FEV1-Pre: 1.11 L
FEV1FVC-%Change-Post: -3 %
FEV1FVC-%Pred-Pre: 117 %
FEV6-%Change-Post: 17 %
FEV6-%Pred-Post: 44 %
FEV6-%Pred-Pre: 37 %
FEV6-POST: 1.54 L
FEV6-Pre: 1.32 L
FEV6FVC-%Pred-Post: 107 %
FEV6FVC-%Pred-Pre: 107 %
FVC-%CHANGE-POST: 17 %
FVC-%Pred-Post: 41 %
FVC-%Pred-Pre: 35 %
FVC-Post: 1.54 L
FVC-Pre: 1.32 L
PRE FEV6/FVC RATIO: 100 %
Post FEV1/FVC ratio: 81 %
Post FEV6/FVC ratio: 100 %
Pre FEV1/FVC ratio: 84 %

## 2018-11-27 MED ORDER — METOPROLOL TARTRATE 25 MG PO TABS: ORAL_TABLET | ORAL | Status: DC

## 2018-11-27 MED ORDER — ALBUTEROL SULFATE HFA 108 (90 BASE) MCG/ACT IN AERS: 2.0000 | INHALATION_SPRAY | RESPIRATORY_TRACT | 11 refills | Status: AC | PRN

## 2018-11-27 MED ORDER — CEFDINIR 300 MG PO CAPS: 300.0000 mg | ORAL_CAPSULE | Freq: Two times a day (BID) | ORAL | 0 refills | Status: DC

## 2018-11-27 MED ORDER — PREDNISONE 10 MG PO TABS: ORAL_TABLET | ORAL | 0 refills | Status: DC

## 2018-11-27 MED ORDER — BUDESONIDE-FORMOTEROL FUMARATE 160-4.5 MCG/ACT IN AERO: 2.0000 | INHALATION_SPRAY | Freq: Two times a day (BID) | RESPIRATORY_TRACT | 0 refills | Status: DC

## 2018-11-27 NOTE — Progress Notes (Addendum)
Jeffrey [name]    [id number]       Primary Care Physician:[name],[name], MD  Referring Physician: Thurman Coyer, MD [address] [place], [place] [address]  Chief complaint:  Consult for evaluation, management of COPD  HPI: 77-yowm obese s/p smoking cessation 1985  with history of end-stage renal disease on hemodialysis, COPD GOLD 0    diabetes mellitus, aortic stenosis s/p TAVR, ICD implantation. He was admitted in April 2018 for acute respiratory failure, COPD exacerbation in setting of parainfluenza virus. He was treated with Solu-Medrol, doonebs, Mucinex, doxycycline with improvement in symptoms.   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 Smoking history: 60 pack year smoking history. Quit in 1983.     04/16/2017 acute extended ov/Jeffrey [name] re:  aecopd / GOLD 0 criteria previously  Chief Complaint  Patient presents with  . Acute Visit    Pt states "lung full of congestion"- increased SOB x 2 months. He states also coughing more and cough was prod last night after he used symbicort with green sputum.  He has only been using symbicort every 4 hours and not using his albuterol inhaler. He uses neb once per day on average.   downhill since transient improvement p d/c from hospital 2 m prior to OV   nausea is 24/7  symb over use (up to every 4 hours) and extremely poor insight into meds  HD mwf  No better with prednisone in past  Cough worse in am's, better p neb, green mucus mostly in ams Sob at rest between treatments and no better before vs after HD  rec Change Zantac 150 mg in am (after dialysis) and one at bedtime  omnicef 300 mg twice daily x 7 days  For cough > mucinex dm 1200 mg every 12 hours as needed and use the flutter valve as much as possible Plan A = Automatic = Symbicort 160 Take 2 puffs first thing in am and then another 2 puffs about 12  hours later.  Plan B = Backup Only use your albuterol (proventil)  Plan C = Crisis -   use your albuterol/ipatropium nebulizer if you first try Plan B and it fails to help > ok to use the nebulizer up to every 4 hours but if start needing it regularly call for immediate appointment See Tammy NP w/in 2 weeks with all your medications   DgEs 10/16/18 Nonspecific esophageal motility disorder. While there is no fixed focal stricture, the mid to distal esophagus appears slightly smaller caliber relative to the proximal thoracic esophagus. The barium tablet sticks in the midesophagus and again in the distal esophagus. May consider further evaluation with Endoscopy > not done    10/28/2018  f/u ov/Jeffrey [name] re:  Re-establish re sob/ cough/ 02 3lpm 24/7  - worse since summer 2019  Chief Complaint  Patient presents with  . Follow-up   Dyspnea: 10 ft with cane and one person assist  Cough:  Worse at hs lies on R side / can't breath on back x one year whether p HD or not  Sleeping: ok  But  full of congestion in throat in am > sev tsp mucoid  SABA use: proventil once or twice daily helps some 02: 3lpm 24/7   Has hb symptoms despite ppi which he take prior to HD  rec Bed blocks x 6-8 inches GERD  Diet  Plan A = Automatic = stop advair and start symbicort 160 Take 2 puffs first thing in am and then another 2 puffs about 12 hours later and when it runs out change to symbicort 80 Take 2 puffs first thing in am and then another 2 puffs about 12 hours later.    11/27/2018  f/u ov/Jeffrey [name] re: GOLD 0 copd  Chief Complaint  Patient presents with  . Follow-up    PFT's done today. Breathing has been worse since the last appt.  He is out of his rescue inhaler and he has not been using.   Dyspnea:  Ok at rest/ more weak than sob with activity  Cough: some green in am x one week Sleeping: 4.5 inch bed clocks SABA use: avg 2 x daily  02: 2-3 liter 24/7    No obvious day to day or daytime variability or  assoc mucus plugs or hemoptysis or cp or chest tightness, subjective wheeze or overt sinus or hb symptoms.    Also denies any obvious fluctuation of symptoms with weather or environmental changes or other aggravating or alleviating factors except as outlined above   No unusual exposure hx or h/o childhood pna/ asthma or knowledge of premature birth.  Current Allergies, Complete Past Medical History, Past Surgical History, Family History, and Social History were reviewed in [employer] record.  ROS  The following are not active complaints unless bolded Hoarseness, sore throat, dysphagia, dental problems, itching, sneezing,  nasal congestion or discharge of excess mucus or purulent secretions, ear ache,   fever, chills, sweats, unintended wt loss or wt gain, classically pleuritic or exertional cp,  orthopnea pnd or arm/hand swelling  or leg swelling, presyncope, palpitations, abdominal pain, anorexia, nausea, vomiting, diarrhea  or change in bowel habits or change in bladder habits, change in stools or change in urine, dysuria, hematuria,  rash, arthralgias, visual complaints, headache, numbness, weakness or ataxia or problems with walking or coordination,  change in mood or  memory.        Current Meds  Medication Sig  . albuterol (PROVENTIL HFA;VENTOLIN HFA) 108 (90 Base) MCG/ACT inhaler Inhale 2 puffs into the lungs every 6 (six) hours as needed for wheezing or shortness of breath.  [name] apixaban (ELIQUIS) 5 MG TABS tablet Take 1 tablet (5 mg total) by mouth 2 (two) times daily with a meal.  . atorvastatin (LIPITOR) 80 MG tablet Take 1 tablet by mouth daily.  . budesonide-formoterol (SYMBICORT) 80-4.5 MCG/ACT inhaler Take 2 puffs first thing in am and then another 2 puffs about 12 hours later.  . cinacalcet (SENSIPAR) 30 MG tablet Take 30 mg by mouth daily with supper.  . clopidogrel (PLAVIX) 75 MG tablet Take 1 tablet (75 mg total) by mouth daily.  . insulin detemir  (LEVEMIR) 100 UNIT/ML injection Inject 0.5 mLs (50 Units total) into the skin at bedtime.  . Insulin Pen Needle 31G X 6 MM MISC USE 1  THREE TIMES DAILY  . lidocaine-prilocaine (EMLA) cream Apply 1 application topically once as needed (prior to accessing port).   . loratadine (CLARITIN) 10 MG tablet Take 1 tablet (10 mg total) by mouth daily.  . metoprolol tartrate (LOPRESSOR) 25 MG tablet Take 1 tablet (25 mg total) by mouth 2 (two) times daily.  . nitroGLYCERIN (NITROSTAT) 0.4 MG SL tablet Place 1 tablet (0.4 mg total) under the tongue every 5 (five) minutes as needed for chest pain.  . OXYGEN Inhale into the lungs. 2-3 lpm 24/7  .  pantoprazole (PROTONIX) 40 MG tablet Take 1 tablet (40 mg total) by mouth daily.  . Triamcinolone Acetonide (TRIAMCINOLONE 0.1 % CREAM : EUCERIN) CREA Apply 1 application topically 3 (three) times daily as needed. Apply to arms, back, and other areas with active rash/itching           Physical Exam:  Elderly wm w/c bound   11/27/2018         210  10/28/2018      236   04/16/17 230 lb (104.3 kg)  03/27/17 231 lb 12.8 oz (105.1 kg)  03/13/17 231 lb 7.7 oz (105 kg)    Vital signs reviewed - Note on arrival 02 sats  96% on 2lpm continuous            HEENT: nl dentition / oropharynx. Nl external ear canals without cough reflex -  Mild bilateral non-specific turbinate edema     NECK :  without JVD/Nodes/TM/ nl carotid upstrokes bilaterally   LUNGS: no acc muscle use,  Mild barrel  contour chest wall with bilateral  coarse insp/exp rhonchi   without cough on insp or exp maneuver and mild  Hyperresonant  to  percussion bilaterally     CV:  RRR  no s3  II-III/VI sem s  increase in P2, and trace sym Bilateral LE pitting edema   ABD:  soft and nontender .No bruits or organomegaly appreciated, bowel sounds nl  MS:   Nl gait/  ext warm without deformities, calf tenderness, cyanosis or clubbing No obvious joint restrictions   SKIN: warm and dry without lesions      NEURO:  alert, approp, nl sensorium with  no motor or cerebellar deficits apparent.    CXR PA and Lateral:   11/27/2018 :    I personally reviewed images and agree with radiology impression as follows:    Cardiomegaly. Moderate vascular congestion. Low lung volumes. No active infiltrates or failure. Worsening aeration from priors.

## 2018-11-27 NOTE — Progress Notes (Signed)
LMTCB

## 2018-11-27 NOTE — Progress Notes (Signed)
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  Patient has had some symptoms of dysphagia for the past 6 months or so. He has intermittent solid food dysphagia, no dysphagia to liquids. This may occur once or twice a week and then he will not have it for a week or 2. He will experience dysphagia in the mid to lower esophagus. He had a barium swallow in November showing slightly smaller caliber esophagus in the mid to lower esophagus where a 13 mm tablet gets stuck in both locations. There was evidence of a nonspecific motility disorder as well. No malignant-appearing changes. He has been on pantoprazole 40 mg once a day for the past 3-4 months for history of reflux. He denies any symptoms of heartburn that bother him. He has occasional nausea but no vomiting. Fairly regular bowel habits, perhaps some looser stools in the past month. He has roughly 1-2 bowel movements a day. No blood in his stools. No significant abdominal pain. He's never had a prior upper endoscopy. He's had a prior colonoscopy in the 1980s, states it was normal and that "he will never have another one". No family history of esophageal or colon cancer that is known.  Of note he's had coronary angiography 07/31/2018 - severe 2 vessel obstructive CAD, stent proximal to the mid LAD patent, PTCA of ostial Lcx. Cardiology recommended uninterrupted aspirin 81mg  and plavix 75mg  for minimum of 12 months  Barium swallow 10/16/2018 - mid to distal esophagus smaller in caliber relative to thoracic esophagus, 68mm tablet sticks in mid and distal esophagus. Nonspecific motility disorder  Past Medical History:  Diagnosis Date  . Anginal pain ([place])   . Aortic stenosis    a. severe by echo 09/2014  . Atrial fibrillation  ([place])    a. not well documented, not on anticoagulation  . CHF (congestive heart failure) ([place])    04/28/17 echo-EF 40%, mod LVH, diastolic dysfunction  . Claustrophobia   . Complete heart block ([place])   . COPD (chronic obstructive pulmonary disease) ([place])   . Coronary artery disease    a. chronically occluded RCA per cath 09/2014 with collaterals B. cath 05/01/17 chr occ RCA w/collaterals, 60-70% mid LAD,   . CVA (cerebral vascular accident) ([place]) 10/2014   denies residual on 07/11/2015  . ESRD (end stage renal disease) on dialysis [facility])    a. on dialysis; [facility]; MWF, LUE fistula (07/11/2015)  . History of blood transfusion    "related to gallbladder OR"  . History of stomach ulcers   . Hyperlipidemia   . Hypertension   . Iron deficiency anemia   . Myocardial infarction ([place]) 10/2014  . Peripheral vascular disease ([place])   . Pneumonia   . Presence of permanent cardiac pacemaker   . 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 Type II diabetes mellitus ([place])      Past Surgical History:  Procedure Laterality Date  . AV FISTULA PLACEMENT Left 10/19/2014   Procedure: BRACHIOCEPHALIC ARTERIOVENOUS (AV) FISTULA CREATION ;  Surgeon: Conrad [place], MD;  Location: [place];  Service: Vascular;  Laterality: Left;  . CARDIAC CATHETERIZATION    .  CARDIAC CATHETERIZATION N/A 07/22/2015   Procedure: Right/Left Heart Cath and Coronary Angiography;  Surgeon: [name], MD;  Location: [place] CV LAB;  Service: Cardiovascular;  Laterality: N/A;  . CATARACT EXTRACTION W/ INTRAOCULAR LENS  IMPLANT, BILATERAL Bilateral 1990's  . CHOLECYSTECTOMY OPEN  1980's  . COLONOSCOPY W/ BIOPSIES AND POLYPECTOMY    . CORONARY ANGIOGRAPHY N/A 07/31/2018   Procedure: CORONARY ANGIOGRAPHY;  Surgeon: Martinique, Peter M, MD;  Location: [place] CV LAB;  Service: Cardiovascular;  Laterality:  N/A;  . CORONARY ANGIOPLASTY    . CORONARY ATHERECTOMY N/A 04/18/2018   Procedure: CORONARY ATHERECTOMY;  Surgeon: [name], MD;  Location: [place] CV LAB;  Service: Cardiovascular;  Laterality: N/A;  . CORONARY BALLOON ANGIOPLASTY N/A 07/31/2018   Procedure: CORONARY BALLOON ANGIOPLASTY;  Surgeon: Martinique, Peter M, MD;  Location: [place] CV LAB;  Service: Cardiovascular;  Laterality: N/A;  . CORONARY STENT INTERVENTION N/A 04/18/2018   Procedure: CORONARY STENT INTERVENTION;  Surgeon: [name], MD;  Location: [place] CV LAB;  Service: Cardiovascular;  Laterality: N/A;  . EP IMPLANTABLE DEVICE N/A 07/11/2015   Procedure: Pacemaker Implant;  Surgeon: [name], MD;  Location: [place] CV LAB;  Service: Cardiovascular;  Laterality: N/A;  . ESOPHAGOGASTRODUODENOSCOPY  08/01/2012   Procedure: ESOPHAGOGASTRODUODENOSCOPY (EGD);  Surgeon: [name], MD;  Location: [facility];  Service: Endoscopy;  Laterality: N/A;  . INSERT / REPLACE / REMOVE PACEMAKER  07/11/2015  . INSERTION OF DIALYSIS CATHETER Right 02/02/2015   Procedure: INSERTION OF DIALYSIS CATHETER  RIGHT INTERNAL JUGULAR;  Surgeon: Mal [name], MD;  Location: [place];  Service: Vascular;  Laterality: Right;  . LEFT AND RIGHT HEART CATHETERIZATION WITH CORONARY ANGIOGRAM N/A 09/30/2014   Procedure: LEFT AND RIGHT HEART CATHETERIZATION WITH CORONARY ANGIOGRAM;  Surgeon: [name], MD;  Location: [facility] CATH LAB;  Service: Cardiovascular;  Laterality: N/A;  . LEFT HEART CATH AND CORONARY ANGIOGRAPHY N/A 04/14/2018   Procedure: LEFT HEART CATH AND CORONARY ANGIOGRAPHY;  Surgeon: [name], MD;  Location: [place] CV LAB;  Service: Cardiovascular;  Laterality: N/A;  . TEE WITHOUT CARDIOVERSION N/A 08/02/2015   Procedure: TRANSESOPHAGEAL ECHOCARDIOGRAM (TEE);  Surgeon: [name], MD;  Location: [place];  Service: Open Heart Surgery;  Laterality: N/A;  . TONSILLECTOMY    . TRANSCATHETER  AORTIC VALVE REPLACEMENT, TRANSFEMORAL Left 08/02/2015   Procedure: TRANSCATHETER AORTIC VALVE REPLACEMENT, TRANSFEMORAL;  Surgeon: [name], MD;  Location: [place];  Service: Open Heart Surgery;  Laterality: Left;   Family History  Problem Relation Age of Onset  . Diabetes Father   . Heart disease Father   . Diabetes Sister   . Alzheimer's disease Mother   . Stroke Mother   . Diabetes Paternal Grandmother   . Alzheimer's disease Maternal Aunt        x 8 Maternal Aunts  . Cancer Maternal Uncle        type unknown   Social History   Tobacco Use  . Smoking status: Former Smoker    Packs/day: 2.00    Years: 32.00    Pack years: 64.00    Last attempt to quit: 11/27/1983    Years since quitting: 35.0  . Smokeless tobacco: Never Used  Substance Use Topics  . Alcohol use: Yes    Comment: rarely  . Drug use: No   Current Outpatient Medications  Medication Sig Dispense Refill  . apixaban (ELIQUIS) 5 MG TABS tablet Take 1 tablet (5 mg  total) by mouth 2 (two) times daily with a meal. 180 tablet 3  . atorvastatin (LIPITOR) 80 MG tablet Take 1 tablet by mouth daily.    . budesonide-formoterol (SYMBICORT) 160-4.5 MCG/ACT inhaler Inhale 2 puffs into the lungs 2 (two) times daily. 1 Inhaler 0  . cinacalcet (SENSIPAR) 30 MG tablet Take 30 mg by mouth daily with supper.    . clopidogrel (PLAVIX) 75 MG tablet Take 1 tablet (75 mg total) by mouth daily. 90 tablet 3  . insulin detemir (LEVEMIR) 100 UNIT/ML injection Inject 0.5 mLs (50 Units total) into the skin at bedtime. 10 mL 2  . Insulin Pen Needle 31G X 6 MM MISC USE 1  THREE TIMES DAILY    . lidocaine-prilocaine (EMLA) cream Apply 1 application topically once as needed (prior to accessing port).     . loratadine (CLARITIN) 10 MG tablet Take 1 tablet (10 mg total) by mouth daily. 30 tablet 11  . metoprolol tartrate (LOPRESSOR) 25 MG tablet One half twice daily    . OXYGEN Inhale into the lungs. 2-3 lpm 24/7    . pantoprazole (PROTONIX) 40  MG tablet Take 1 tablet (40 mg total) by mouth daily. 90 tablet 3  . Triamcinolone Acetonide (TRIAMCINOLONE 0.1 % CREAM : EUCERIN) CREA Apply 1 application topically 3 (three) times daily as needed. Apply to arms, back, and other areas with active rash/itching 1 each 1  . albuterol (PROVENTIL HFA;VENTOLIN HFA) 108 (90 Base) MCG/ACT inhaler Inhale 2 puffs into the lungs every 4 (four) hours as needed for wheezing or shortness of breath. (Patient not taking: Reported on 11/27/2018) 1 Inhaler 11  . cefdinir (OMNICEF) 300 MG capsule Take 1 capsule (300 mg total) by mouth 2 (two) times daily. (Patient not taking: Reported on 11/27/2018) 20 capsule 0  . nitroGLYCERIN (NITROSTAT) 0.4 MG SL tablet Place 1 tablet (0.4 mg total) under the tongue every 5 (five) minutes as needed for chest pain. (Patient not taking: Reported on 11/27/2018) 30 tablet 12  . predniSONE (DELTASONE) 10 MG tablet Take  4 each am x 2 days,   2 each am x 2 days,  1 each am x 2 days and stop (Patient not taking: Reported on 11/27/2018) 14 tablet 0   No current facility-administered medications for this visit.    Allergies  Allergen Reactions  . Byetta 10 Mcg Pen [Exenatide] Diarrhea and Nausea And Vomiting  . Codeine Itching  . Coumadin [Warfarin Sodium] Rash     Review of Systems: All systems reviewed and negative except where noted in HPI.    Ct Head Wo Contrast  Result Date: 11/05/2018 CLINICAL DATA:  78 year old male with head trauma. EXAM: CT HEAD WITHOUT CONTRAST TECHNIQUE: Contiguous axial images were obtained from the base of the skull through the vertex without intravenous contrast. COMPARISON:  Head CT dated 09/28/2014 FINDINGS: Brain: There is mild age-related atrophy and chronic microvascular ischemic changes. There is no acute intracranial hemorrhage. No mass effect or midline shift. No extra-axial fluid collection. Vascular: No hyperdense vessel or unexpected calcification. Skull: Normal. Negative for fracture or focal  lesion. Sinuses/Orbits: No acute finding. Other: None IMPRESSION: 1. No acute intracranial hemorrhage. 2. Mild age-related atrophy and chronic microvascular ischemic changes. Electronically Signed   By: Anner Crete M.D.   On: 11/05/2018 05:49   Lab Results  Component Value Date   WBC 9.6 11/05/2018   HGB 9.2 (L) 11/05/2018   HCT 30.8 (L) 11/05/2018   MCV 93.1 11/05/2018   PLT 210  11/05/2018    Lab Results  Component Value Date   CREATININE 4.29 (H) 11/05/2018   BUN 46 (H) 11/05/2018   NA 132 (L) 11/05/2018   K 4.5 11/05/2018   CL 93 (L) 11/05/2018   CO2 24 11/05/2018    Lab Results  Component Value Date   ALT 14 07/30/2018   AST 19 07/30/2018   ALKPHOS 67 07/30/2018   BILITOT 1.1 07/30/2018      Physical Exam: BP (!) 130/50 (BP Location: Right Arm, Patient Position: Sitting, Cuff Size: Normal)   Pulse (!) 52  Constitutional: Pleasant, male in no acute distress, on oxygen sitting in wheelchair HEENT: Normocephalic and atraumatic.  Neck supple.  Cardiovascular: Normal rate, regular rhythm.  Pulmonary/chest: decreased BS bilaterally, coughing Abdominal: Soft, protuberant, nontender.  There are no masses palpable. No hepatomegaly. Extremities: (+) LE edema Lymphadenopathy: No cervical adenopathy noted. Neurological: Alert and oriented to person place and time. Skin: Skin is warm and dry. No rashes noted. Psychiatric: Normal mood and affect. Behavior is normal.   ASSESSMENT AND PLAN: 78 year old male with numerous comorbidities as outlined above, here for new patient assessment of the following:  Dysphagia / GERD / anticoagulated / oxygen dependant - mild / intermittent solid food dysphagia for the past 6 months. Barium swallow with some slightly smaller caliber lower to mid esophagus but without focal stricturing or any malignant changes, along with some nonspecific dysmotility. I discussed ddx with him. Normally would recommend EGD in this situation with dilation  to further evaluate and treat if amenable. He is much higher than average risk for anesthesia and endoscopy given his co-morbidities. Further, he cannot hold anticoagulation for at least another 9-10 months per cardiology given his angioplasty, thus he is not a candidate for any dilation in the near future. While the barium study shows what is very likely benign changes, cannot rule out malignancy without looking. We discussed the role of diagnostic EGD - this would require anesthesia at the hospital. If he did have a malignancy, he is a very poor surgical candidate. We discussed all of these issues together with the patient and his wife and how he wished to proceed. After this discussion, he does not wish to have any endoscopy unless dilation can be performed. He will chew food well to avoid impaction and minimize symptoms, he should be very careful with meats / chicken, very small bites only. He will monitor for now. He will contact me if he wishes to have an evaluation in the future or when he can hold anticoagulation. If symptoms worsen in the interim would recommend a repeat barium study first to assess for interval changes. He agreed. With plan.   Duluth Cellar, MD [facility]  CC: [name], Dianna Rossetti, MD

## 2018-11-27 NOTE — Patient Instructions (Addendum)
Plan A = Automatic = symbicort 160 Take 2 puffs first thing in am and then another 2 puffs about 12 hours later - if working better call for rx    Work on inhaler technique:  relax and gently blow all the way out then take a nice smooth deep breath back in, triggering the inhaler at same time you start breathing in.  Hold for up to 5 seconds if you can. Blow out thru nose. Rinse and gargle with water when done     Plan B = Backup Only use your albuterol inhaler(proair)  as a rescue medication to be used if you can't catch your breath by resting or doing a relaxed purse lip breathing pattern.  - The less you use it, the better it will work when you need it. - Ok to use the inhaler up to 2 puffs  every 4 hours if you must but call for appointment if use goes up over your usual need - Don't leave home without it !!  (think of it like the spare tire for your car)    Prednisone 10 mg take  4 each am x 2 days,   2 each am x 2 days,  1 each am x 2 days and stop   omnicef 300 mg twice daily x 10 days     Reduce the metaprolol 25 mg to one-half twice daily   Please remember to go to the  x-ray department at the [facility] building (directly across from [facility])  @ [place]  in the basement  for your tests - we will call you with the results when they are available   Please schedule a follow up office visit in 4 weeks, sooner if needed  with all medications /inhalers/ solutions in hand so we can verify exactly what you are taking. This includes all medications from all doctors and over the counters    .

## 2018-11-27 NOTE — Progress Notes (Signed)
Spirometry pre and post done today. 

## 2018-11-28 ENCOUNTER — Encounter: Payer: Self-pay | Admitting: Internal Medicine

## 2018-11-28 NOTE — Assessment & Plan Note (Addendum)
Quit smoking in 1985 PFTs 07/20/15 FEV1 1.71 (61%) ratio  75  DLCO 53%  - Spirometry 09/26/2017  FEV1 2.55 (83%)  Ratio 76 off all rx x months - 10/28/2018  After extensive coaching inhaler device,  effectiveness =    75% > try sym 160 2bid samples then reduce to 80 2bid due to concern may make hoarsness / upper airway cough worse   - PFT's  11/27/2018  FEV1 1.25 (47 % ) ratio 81  p 13 % improvement from saba p sym80 prior to study without any curvature to f/v loop - 11/27/2018  After extensive coaching inhaler device,  effectiveness =   75% hfa/ try back on symb 160 2bid as flare of Bronchitis on the 80 at time ofpfts  Even in the presence of a classic flare of asthmatic bronchitis clinically, we cannot demonstrate any evidence of significant COPD but rather a fully reversible component typical of asthmatic bronchitis superimposed on restrictive changes that are likely to related to body habitus and volume overload chronically.  Clearly based on barium swallow he is also at risk of aspiration and plans to follow-up with Dr. Havery Moros with GI today though options are limited because he cannot come off anticoagulation for dilatation of the esophagus.  He may benefit from higher doses of Symbicort if it does not exacerbate his upper airway problem.  I have given him a two-week sample and trained him on the appropriate use of HFA inhalers which he achieved in a 75% level.   Also will treat with a short course of prednisone and Omnicef for the acute  asthmatic bronchitic component.

## 2018-11-28 NOTE — Assessment & Plan Note (Addendum)
Adequate control on present rx, reviewed in detail with pt > no change in rx needed  = maintain O2 sats greater than 90% at all times with 2 L floor and 3lpm ceiling/ advised

## 2018-11-28 NOTE — Assessment & Plan Note (Signed)
He is over beta blocked at present based on his pulse and blood pressure today so advised him to reduce the dose by half.  This may allow more fluid to be pulled off at dialysis which was also advised based on his chest x-ray today.   I had an extended discussion with the patient/wife  reviewing all relevant studies completed to date and  lasting 15 to 20 minutes of a 25 minute visit    See device teaching which extended face to face time for this visit.  Each maintenance medication was reviewed in detail including emphasizing most importantly the difference between maintenance and prns and under what circumstances the prns are to be triggered using an action plan format that is not reflected in the computer generated alphabetically organized AVS which I have not found useful in most complex patients, especially with respiratory illnesses  Please see AVS for specific instructions unique to this visit that I personally wrote and verbalized to the the pt in detail and then reviewed with pt  by my nurse highlighting any  changes in therapy recommended at today's visit to their plan of care.     F/u in 4 weeks with all meds in hand using a trust but verify approach to confirm accurate Medication  Reconciliation The principal here is that until we are certain that the  patients are doing what we've asked, it makes no sense to ask them to do more.

## 2018-11-29 ENCOUNTER — Inpatient Hospital Stay (HOSPITAL_COMMUNITY)
Admission: EM | Admit: 2018-11-29 | Discharge: 2018-12-06 | DRG: 190 | Disposition: A | Discharge status: Home Health | Payer: Medicare Other | Attending: Family Medicine | Admitting: Family Medicine

## 2018-11-29 ENCOUNTER — Emergency Department (HOSPITAL_COMMUNITY): Payer: Medicare Other

## 2018-11-29 ENCOUNTER — Encounter (HOSPITAL_COMMUNITY): Payer: Self-pay | Admitting: Emergency Medicine

## 2018-11-29 DIAGNOSIS — K219 Gastro-esophageal reflux disease without esophagitis: Secondary | ICD-10-CM | POA: Diagnosis present

## 2018-11-29 DIAGNOSIS — E785 Hyperlipidemia, unspecified: Secondary | ICD-10-CM | POA: Diagnosis present

## 2018-11-29 DIAGNOSIS — M79605 Pain in left leg: Secondary | ICD-10-CM | POA: Diagnosis not present

## 2018-11-29 DIAGNOSIS — J9611 Chronic respiratory failure with hypoxia: Secondary | ICD-10-CM | POA: Diagnosis present

## 2018-11-29 DIAGNOSIS — I2510 Atherosclerotic heart disease of native coronary artery without angina pectoris: Secondary | ICD-10-CM | POA: Diagnosis present

## 2018-11-29 DIAGNOSIS — E1122 Type 2 diabetes mellitus with diabetic chronic kidney disease: Secondary | ICD-10-CM

## 2018-11-29 DIAGNOSIS — J440 Chronic obstructive pulmonary disease with acute lower respiratory infection: Secondary | ICD-10-CM | POA: Diagnosis present

## 2018-11-29 DIAGNOSIS — N2581 Secondary hyperparathyroidism of renal origin: Secondary | ICD-10-CM | POA: Diagnosis present

## 2018-11-29 DIAGNOSIS — Z833 Family history of diabetes mellitus: Secondary | ICD-10-CM

## 2018-11-29 DIAGNOSIS — I132 Hypertensive heart and chronic kidney disease with heart failure and with stage 5 chronic kidney disease, or end stage renal disease: Secondary | ICD-10-CM | POA: Diagnosis present

## 2018-11-29 DIAGNOSIS — Z992 Dependence on renal dialysis: Secondary | ICD-10-CM

## 2018-11-29 DIAGNOSIS — J205 Acute bronchitis due to respiratory syncytial virus: Secondary | ICD-10-CM | POA: Diagnosis present

## 2018-11-29 DIAGNOSIS — I252 Old myocardial infarction: Secondary | ICD-10-CM | POA: Diagnosis not present

## 2018-11-29 DIAGNOSIS — R4702 Dysphasia: Secondary | ICD-10-CM | POA: Diagnosis present

## 2018-11-29 DIAGNOSIS — Z953 Presence of xenogenic heart valve: Secondary | ICD-10-CM

## 2018-11-29 DIAGNOSIS — Z823 Family history of stroke: Secondary | ICD-10-CM

## 2018-11-29 DIAGNOSIS — Z9049 Acquired absence of other specified parts of digestive tract: Secondary | ICD-10-CM

## 2018-11-29 DIAGNOSIS — Z8673 Personal history of transient ischemic attack (TIA), and cerebral infarction without residual deficits: Secondary | ICD-10-CM | POA: Diagnosis not present

## 2018-11-29 DIAGNOSIS — Z794 Long term (current) use of insulin: Secondary | ICD-10-CM

## 2018-11-29 DIAGNOSIS — F40240 Claustrophobia: Secondary | ICD-10-CM | POA: Diagnosis present

## 2018-11-29 DIAGNOSIS — Z87891 Personal history of nicotine dependence: Secondary | ICD-10-CM

## 2018-11-29 DIAGNOSIS — N186 End stage renal disease: Secondary | ICD-10-CM

## 2018-11-29 DIAGNOSIS — Z7901 Long term (current) use of anticoagulants: Secondary | ICD-10-CM | POA: Diagnosis not present

## 2018-11-29 DIAGNOSIS — I249 Acute ischemic heart disease, unspecified: Secondary | ICD-10-CM | POA: Diagnosis not present

## 2018-11-29 DIAGNOSIS — Z8249 Family history of ischemic heart disease and other diseases of the circulatory system: Secondary | ICD-10-CM

## 2018-11-29 DIAGNOSIS — I10 Essential (primary) hypertension: Secondary | ICD-10-CM

## 2018-11-29 DIAGNOSIS — M79604 Pain in right leg: Secondary | ICD-10-CM | POA: Diagnosis not present

## 2018-11-29 DIAGNOSIS — I442 Atrioventricular block, complete: Secondary | ICD-10-CM | POA: Diagnosis present

## 2018-11-29 DIAGNOSIS — I4891 Unspecified atrial fibrillation: Secondary | ICD-10-CM | POA: Diagnosis not present

## 2018-11-29 DIAGNOSIS — Z7951 Long term (current) use of inhaled steroids: Secondary | ICD-10-CM

## 2018-11-29 DIAGNOSIS — R7989 Other specified abnormal findings of blood chemistry: Secondary | ICD-10-CM

## 2018-11-29 DIAGNOSIS — E1151 Type 2 diabetes mellitus with diabetic peripheral angiopathy without gangrene: Secondary | ICD-10-CM | POA: Diagnosis present

## 2018-11-29 DIAGNOSIS — I255 Ischemic cardiomyopathy: Secondary | ICD-10-CM | POA: Diagnosis present

## 2018-11-29 DIAGNOSIS — Z950 Presence of cardiac pacemaker: Secondary | ICD-10-CM

## 2018-11-29 DIAGNOSIS — R079 Chest pain, unspecified: Secondary | ICD-10-CM | POA: Diagnosis present

## 2018-11-29 DIAGNOSIS — Z888 Allergy status to other drugs, medicaments and biological substances status: Secondary | ICD-10-CM

## 2018-11-29 DIAGNOSIS — B974 Respiratory syncytial virus as the cause of diseases classified elsewhere: Secondary | ICD-10-CM | POA: Diagnosis not present

## 2018-11-29 DIAGNOSIS — R071 Chest pain on breathing: Secondary | ICD-10-CM | POA: Diagnosis not present

## 2018-11-29 DIAGNOSIS — E875 Hyperkalemia: Secondary | ICD-10-CM | POA: Diagnosis present

## 2018-11-29 DIAGNOSIS — E1165 Type 2 diabetes mellitus with hyperglycemia: Secondary | ICD-10-CM | POA: Diagnosis present

## 2018-11-29 DIAGNOSIS — J449 Chronic obstructive pulmonary disease, unspecified: Secondary | ICD-10-CM

## 2018-11-29 DIAGNOSIS — D631 Anemia in chronic kidney disease: Secondary | ICD-10-CM | POA: Diagnosis present

## 2018-11-29 DIAGNOSIS — Z9981 Dependence on supplemental oxygen: Secondary | ICD-10-CM

## 2018-11-29 DIAGNOSIS — Z885 Allergy status to narcotic agent status: Secondary | ICD-10-CM

## 2018-11-29 DIAGNOSIS — Z809 Family history of malignant neoplasm, unspecified: Secondary | ICD-10-CM

## 2018-11-29 DIAGNOSIS — I4821 Permanent atrial fibrillation: Secondary | ICD-10-CM | POA: Diagnosis present

## 2018-11-29 DIAGNOSIS — Z7902 Long term (current) use of antithrombotics/antiplatelets: Secondary | ICD-10-CM | POA: Diagnosis not present

## 2018-11-29 DIAGNOSIS — M79609 Pain in unspecified limb: Secondary | ICD-10-CM | POA: Diagnosis not present

## 2018-11-29 DIAGNOSIS — I5043 Acute on chronic combined systolic (congestive) and diastolic (congestive) heart failure: Secondary | ICD-10-CM | POA: Diagnosis present

## 2018-11-29 DIAGNOSIS — M25511 Pain in right shoulder: Secondary | ICD-10-CM

## 2018-11-29 DIAGNOSIS — B338 Other specified viral diseases: Secondary | ICD-10-CM | POA: Diagnosis present

## 2018-11-29 DIAGNOSIS — Z955 Presence of coronary angioplasty implant and graft: Secondary | ICD-10-CM | POA: Diagnosis not present

## 2018-11-29 DIAGNOSIS — R778 Other specified abnormalities of plasma proteins: Secondary | ICD-10-CM

## 2018-11-29 DIAGNOSIS — Z820 Family history of epilepsy and other diseases of the nervous system: Secondary | ICD-10-CM

## 2018-11-29 DIAGNOSIS — E11649 Type 2 diabetes mellitus with hypoglycemia without coma: Secondary | ICD-10-CM

## 2018-11-29 DIAGNOSIS — J441 Chronic obstructive pulmonary disease with (acute) exacerbation: Secondary | ICD-10-CM | POA: Diagnosis present

## 2018-11-29 DIAGNOSIS — K224 Dyskinesia of esophagus: Secondary | ICD-10-CM | POA: Diagnosis present

## 2018-11-29 LAB — I-STAT CG4 LACTIC ACID, ED: Lactic Acid, Venous: 1.05 mmol/L (ref 0.5–1.9)

## 2018-11-29 LAB — CBC WITH DIFFERENTIAL/PLATELET
Abs Immature Granulocytes: 0.02 10*3/uL (ref 0.00–0.07)
Basophils Absolute: 0 10*3/uL (ref 0.0–0.1)
Basophils Relative: 0 %
Eosinophils Absolute: 0 10*3/uL (ref 0.0–0.5)
Eosinophils Relative: 0 %
HCT: 31.4 % — ABNORMAL LOW (ref 39.0–52.0)
Hemoglobin: 9.2 g/dL — ABNORMAL LOW (ref 13.0–17.0)
Immature Granulocytes: 0 %
Lymphocytes Relative: 6 %
Lymphs Abs: 0.3 10*3/uL — ABNORMAL LOW (ref 0.7–4.0)
MCH: 26.4 pg (ref 26.0–34.0)
MCHC: 29.3 g/dL — ABNORMAL LOW (ref 30.0–36.0)
MCV: 90.2 fL (ref 80.0–100.0)
Monocytes Absolute: 0.2 10*3/uL (ref 0.1–1.0)
Monocytes Relative: 3 %
Neutro Abs: 5.6 10*3/uL (ref 1.7–7.7)
Neutrophils Relative %: 91 %
Platelets: 315 10*3/uL (ref 150–400)
RBC: 3.48 MIL/uL — ABNORMAL LOW (ref 4.22–5.81)
RDW: 17.2 % — ABNORMAL HIGH (ref 11.5–15.5)
WBC: 6.2 10*3/uL (ref 4.0–10.5)
nRBC: 0 % (ref 0.0–0.2)

## 2018-11-29 LAB — RESPIRATORY PANEL BY PCR
Adenovirus: NOT DETECTED
Bordetella pertussis: NOT DETECTED
CHLAMYDOPHILA PNEUMONIAE-RVPPCR: NOT DETECTED
Coronavirus 229E: NOT DETECTED
Coronavirus HKU1: NOT DETECTED
Coronavirus NL63: NOT DETECTED
Coronavirus OC43: NOT DETECTED
Influenza A: NOT DETECTED
Influenza B: NOT DETECTED
METAPNEUMOVIRUS-RVPPCR: NOT DETECTED
Mycoplasma pneumoniae: NOT DETECTED
Parainfluenza Virus 1: NOT DETECTED
Parainfluenza Virus 2: NOT DETECTED
Parainfluenza Virus 3: NOT DETECTED
Parainfluenza Virus 4: NOT DETECTED
Respiratory Syncytial Virus: DETECTED — AB
Rhinovirus / Enterovirus: NOT DETECTED

## 2018-11-29 LAB — BASIC METABOLIC PANEL
Anion gap: 11 (ref 5–15)
BUN: 26 mg/dL — ABNORMAL HIGH (ref 8–23)
CO2: 25 mmol/L (ref 22–32)
Calcium: 9.6 mg/dL (ref 8.9–10.3)
Chloride: 95 mmol/L — ABNORMAL LOW (ref 98–111)
Creatinine, Ser: 4.12 mg/dL — ABNORMAL HIGH (ref 0.61–1.24)
GFR calc non Af Amer: 13 mL/min — ABNORMAL LOW (ref >60)
GFR, EST AFRICAN AMERICAN: 15 mL/min — AB (ref >60)
Glucose, Bld: 118 mg/dL — ABNORMAL HIGH (ref 70–99)
Potassium: 5.6 mmol/L — ABNORMAL HIGH (ref 3.5–5.1)
Sodium: 131 mmol/L — ABNORMAL LOW (ref 135–145)

## 2018-11-29 LAB — CBG MONITORING, ED: Glucose-Capillary: 156 mg/dL — ABNORMAL HIGH (ref 70–99)

## 2018-11-29 LAB — TROPONIN I: Troponin I: 0.24 ng/mL (ref <0.03)

## 2018-11-29 LAB — BRAIN NATRIURETIC PEPTIDE: B Natriuretic Peptide: 792.7 pg/mL — ABNORMAL HIGH (ref 0.0–100.0)

## 2018-11-29 MED ORDER — GUAIFENESIN ER 600 MG PO TB12
600.0000 mg | ORAL_TABLET | Freq: Two times a day (BID) | ORAL | Status: DC
  Administered 2018-11-29 – 2018-12-06 (×14): 600 mg via ORAL
  Filled 2018-11-29 (×14): qty 1

## 2018-11-29 MED ORDER — ONDANSETRON HCL 4 MG PO TABS: 4.0000 mg | ORAL_TABLET | Freq: Four times a day (QID) | ORAL | Status: DC | PRN

## 2018-11-29 MED ORDER — INSULIN DETEMIR 100 UNIT/ML ~~LOC~~ SOLN
50.0000 [IU] | Freq: Every day | SUBCUTANEOUS | Status: DC
  Administered 2018-11-29 – 2018-12-05 (×7): 50 [IU] via SUBCUTANEOUS
  Filled 2018-11-29 (×8): qty 0.5

## 2018-11-29 MED ORDER — CINACALCET HCL 30 MG PO TABS
30.0000 mg | ORAL_TABLET | Freq: Every day | ORAL | Status: DC
  Administered 2018-11-30 – 2018-12-06 (×7): 30 mg via ORAL
  Filled 2018-11-29 (×8): qty 1

## 2018-11-29 MED ORDER — ASPIRIN 81 MG PO CHEW
324.0000 mg | CHEWABLE_TABLET | Freq: Once | ORAL | Status: AC
  Administered 2018-11-29: 324 mg via ORAL
  Filled 2018-11-29: qty 4

## 2018-11-29 MED ORDER — METHYLPREDNISOLONE SODIUM SUCC 125 MG IJ SOLR
60.0000 mg | Freq: Two times a day (BID) | INTRAMUSCULAR | Status: DC
  Administered 2018-11-30 – 2018-12-01 (×3): 60 mg via INTRAVENOUS
  Filled 2018-11-29 (×4): qty 2

## 2018-11-29 MED ORDER — SEVELAMER CARBONATE 800 MG PO TABS
1600.0000 mg | ORAL_TABLET | ORAL | Status: DC | PRN
  Filled 2018-11-29: qty 2

## 2018-11-29 MED ORDER — ALBUTEROL SULFATE (2.5 MG/3ML) 0.083% IN NEBU
5.0000 mg | INHALATION_SOLUTION | Freq: Once | RESPIRATORY_TRACT | Status: AC
  Administered 2018-11-29: 5 mg via RESPIRATORY_TRACT
  Filled 2018-11-29: qty 6

## 2018-11-29 MED ORDER — ONDANSETRON HCL 4 MG/2ML IJ SOLN: 4.0000 mg | Freq: Four times a day (QID) | INTRAMUSCULAR | Status: DC | PRN

## 2018-11-29 MED ORDER — ATORVASTATIN CALCIUM 80 MG PO TABS
80.0000 mg | ORAL_TABLET | Freq: Every day | ORAL | Status: DC
  Administered 2018-11-30 – 2018-12-06 (×7): 80 mg via ORAL
  Filled 2018-11-29 (×7): qty 1

## 2018-11-29 MED ORDER — IPRATROPIUM BROMIDE 0.02 % IN SOLN
0.5000 mg | Freq: Once | RESPIRATORY_TRACT | Status: AC
  Administered 2018-11-29: 0.5 mg via RESPIRATORY_TRACT
  Filled 2018-11-29: qty 2.5

## 2018-11-29 MED ORDER — ACETAMINOPHEN 650 MG RE SUPP: 650.0000 mg | Freq: Four times a day (QID) | RECTAL | Status: DC | PRN

## 2018-11-29 MED ORDER — ARFORMOTEROL TARTRATE 15 MCG/2ML IN NEBU
15.0000 ug | INHALATION_SOLUTION | Freq: Two times a day (BID) | RESPIRATORY_TRACT | Status: DC
  Administered 2018-11-29 – 2018-12-06 (×12): 15 ug via RESPIRATORY_TRACT
  Filled 2018-11-29 (×14): qty 2

## 2018-11-29 MED ORDER — PANTOPRAZOLE SODIUM 40 MG PO TBEC
40.0000 mg | DELAYED_RELEASE_TABLET | Freq: Every day | ORAL | Status: DC
  Administered 2018-11-30 – 2018-12-06 (×7): 40 mg via ORAL
  Filled 2018-11-29 (×7): qty 1

## 2018-11-29 MED ORDER — IPRATROPIUM-ALBUTEROL 0.5-2.5 (3) MG/3ML IN SOLN
3.0000 mL | RESPIRATORY_TRACT | Status: DC | PRN
  Administered 2018-12-02 (×2): 3 mL via RESPIRATORY_TRACT
  Filled 2018-11-29 (×2): qty 3

## 2018-11-29 MED ORDER — APIXABAN 5 MG PO TABS
5.0000 mg | ORAL_TABLET | Freq: Two times a day (BID) | ORAL | Status: DC
  Administered 2018-11-30 – 2018-12-06 (×12): 5 mg via ORAL
  Filled 2018-11-29 (×12): qty 1

## 2018-11-29 MED ORDER — SEVELAMER CARBONATE 800 MG PO TABS
4000.0000 mg | ORAL_TABLET | Freq: Three times a day (TID) | ORAL | Status: DC
  Administered 2018-11-30 – 2018-12-03 (×8): 4000 mg via ORAL
  Filled 2018-11-29 (×14): qty 5

## 2018-11-29 MED ORDER — APIXABAN 5 MG PO TABS
5.0000 mg | ORAL_TABLET | Freq: Two times a day (BID) | ORAL | Status: DC
  Filled 2018-11-29: qty 1

## 2018-11-29 MED ORDER — INSULIN ASPART 100 UNIT/ML ~~LOC~~ SOLN
0.0000 [IU] | Freq: Three times a day (TID) | SUBCUTANEOUS | Status: DC
  Administered 2018-11-30: 5 [IU] via SUBCUTANEOUS
  Administered 2018-11-30: 7 [IU] via SUBCUTANEOUS
  Administered 2018-11-30: 9 [IU] via SUBCUTANEOUS
  Administered 2018-12-01: 2 [IU] via SUBCUTANEOUS

## 2018-11-29 MED ORDER — LIDOCAINE-PRILOCAINE 2.5-2.5 % EX CREA: 1.0000 "application " | TOPICAL_CREAM | Freq: Once | CUTANEOUS | Status: DC | PRN

## 2018-11-29 MED ORDER — FENTANYL CITRATE (PF) 100 MCG/2ML IJ SOLN
25.0000 ug | INTRAMUSCULAR | Status: DC | PRN
  Administered 2018-11-29: 25 ug via INTRAVENOUS
  Filled 2018-11-29: qty 2

## 2018-11-29 MED ORDER — ACETAMINOPHEN 325 MG PO TABS
650.0000 mg | ORAL_TABLET | Freq: Four times a day (QID) | ORAL | Status: DC | PRN
  Administered 2018-11-29 – 2018-12-05 (×2): 650 mg via ORAL
  Filled 2018-11-29: qty 2

## 2018-11-29 MED ORDER — SODIUM CHLORIDE 0.9 % IV SOLN
1.0000 g | Freq: Once | INTRAVENOUS | Status: AC
  Administered 2018-11-29: 1 g via INTRAVENOUS
  Filled 2018-11-29: qty 10

## 2018-11-29 MED ORDER — CLOPIDOGREL BISULFATE 75 MG PO TABS
75.0000 mg | ORAL_TABLET | Freq: Every day | ORAL | Status: DC
  Administered 2018-11-30 – 2018-12-06 (×7): 75 mg via ORAL
  Filled 2018-11-29 (×7): qty 1

## 2018-11-29 MED ORDER — BUDESONIDE 0.5 MG/2ML IN SUSP
0.5000 mg | Freq: Two times a day (BID) | RESPIRATORY_TRACT | Status: DC
  Administered 2018-11-30 – 2018-12-06 (×12): 0.5 mg via RESPIRATORY_TRACT
  Filled 2018-11-29 (×14): qty 2

## 2018-11-29 MED ORDER — METHYLPREDNISOLONE SODIUM SUCC 125 MG IJ SOLR
125.0000 mg | Freq: Once | INTRAMUSCULAR | Status: AC
  Administered 2018-11-29: 125 mg via INTRAVENOUS
  Filled 2018-11-29: qty 2

## 2018-11-29 MED ORDER — IPRATROPIUM-ALBUTEROL 0.5-2.5 (3) MG/3ML IN SOLN
3.0000 mL | Freq: Four times a day (QID) | RESPIRATORY_TRACT | Status: DC
  Administered 2018-11-29 – 2018-12-02 (×9): 3 mL via RESPIRATORY_TRACT
  Filled 2018-11-29 (×11): qty 3

## 2018-11-29 MED ORDER — METOPROLOL TARTRATE 12.5 MG HALF TABLET
12.5000 mg | ORAL_TABLET | Freq: Two times a day (BID) | ORAL | Status: DC
  Administered 2018-11-29 – 2018-12-06 (×11): 12.5 mg via ORAL
  Filled 2018-11-29 (×13): qty 1

## 2018-11-29 MED ORDER — FENTANYL CITRATE (PF) 100 MCG/2ML IJ SOLN
50.0000 ug | Freq: Once | INTRAMUSCULAR | Status: AC
  Administered 2018-11-30: 50 ug via INTRAVENOUS

## 2018-11-29 NOTE — ED Notes (Signed)
Pt reports feeling clammy as if low blood sugar. CBG 156.

## 2018-11-29 NOTE — H&P (Addendum)
History and Physical    Jeffrey [name]:[id number] DOB: July 02, 1941 DOA: 11/29/2018  Referring MD/NP/PA: Elnora Morrison, MD PCP: Thurman Coyer, MD  Patient coming from: Home via EMS  Chief Complaint: Cough and fever  I have personally briefly reviewed patient's old medical records in [place]   HPI: Jeffrey [name] is a 78 y.o. male with medical history significant of HTN, HLD, CAD, combined systolic and diastolic CHF, severe AS s/p TAVR, A. 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  Symptoms have been present over the last few weeks.  Fever reportedly checked at home up to 100.2 F.  He reports having a productive cough with yellowish sputum production and wheezing.  Family note patient's unable to get up and move without significant assistance.  He had a full dialysis treatment yesterday, but still reports having lower extremity swelling.  Family noticed that a lady in his center had the flu, but was wearing a mask.  He has been off and on antibiotics and steroids over the last several weeks.  Seen by his pulmonologist Dr. Melvyn Novas 2 days ago, and was started on antibiotics of cefdinir and steroid taper.  Patient reports that he has been utilizing his inhalers and breathing treatments without relief of symptoms.  On this day he was also seen by Dr. Havery Moros of GI for intermittent solid food dysphagia.  Patient underwent recent barium swallow showing slightly smaller caliber of lower to mid esophagus without focal stricturing or malignant changes along with some nonspecific dysmotility.  EGD was deferred due to patient being unable to hold anticoagulation currently.  Associated symptoms of left lower rib /epigastric abdominal pain, poor appetite, leg swelling, severe diarrhea having bowel movements every 2 hours, hallucinating, tremor, and gum pain.  Notes gum pain  similar to previous symptoms with his previous heart attacks.    Last hospitalization was from 07/30/2018-08/01/2018 for NSTEMI.  Patient underwent cardiac catheterization which revealed severe 2 vessel obstructive CAD.  Stents placed proximal to the mid LAD patent, PTCA of ostial Lcx. Cardiology recommended uninterrupted aspirin 81mg  and plavix 75mg  for minimum of 12 months.  Patient was switched to  Plavix and Eliquis.     ED Course: On admission into the emergency department patient was seen to be afebrile, pulse 64-65, respirations 17-23, blood pressure 112/68-119/47, and O2 saturation 97-98% on 3 L of [place] oxygen.  Labs revealed WBC 6.2, hemoglobin 9.2, sodium 131 potassium 4.6, chloride 95, BUN 26, creatinine 4.12, lactic acid 1.05, troponin 0.24, and BNP 792.7.  Chest x-ray showing stable large pericardial silhouette with blunted right costophrenic angle.  Patient had been given Rocephin, 324 mg aspirin to chew, breathing treatments, and return by milligrams of Solu-Medrol.  Cardiology have been consulted.  TRH called to admit.  Review of Systems  Constitutional: Positive for fever and malaise/fatigue.  HENT: Positive for congestion. Negative for sore throat.   Eyes: Negative for photophobia and pain.  Respiratory: Positive for cough, sputum production, shortness of breath and wheezing.   Cardiovascular: Positive for chest pain and leg swelling.  Gastrointestinal: Positive for abdominal pain, diarrhea, heartburn and nausea. Negative for blood in stool.  Genitourinary: Negative for hematuria.       Positive for decreased urine output  Musculoskeletal: Positive for back pain. Negative for falls.  Skin: Negative for itching.  Neurological: Positive for weakness. Negative for loss of  consciousness.  Psychiatric/Behavioral: Positive for depression and hallucinations.    Past Medical History:  Diagnosis Date  . Anginal pain ([place])   . Aortic stenosis    a. severe by echo 09/2014  . Atrial  fibrillation ([place])    a. not well documented, not on anticoagulation  . CHF (congestive heart failure) ([place])    04/28/17 echo-EF 40%, mod LVH, diastolic dysfunction  . Claustrophobia   . Complete heart block ([place])   . COPD (chronic obstructive pulmonary disease) ([place])   . Coronary artery disease    a. chronically occluded RCA per cath 09/2014 with collaterals B. cath 05/01/17 chr occ RCA w/collaterals, 60-70% mid LAD,   . CVA (cerebral vascular accident) ([place]) 10/2014   denies residual on 07/11/2015  . ESRD (end stage renal disease) on dialysis [facility])    a. on dialysis; [facility]; MWF, LUE fistula (07/11/2015)  . History of blood transfusion    "related to gallbladder OR"  . History of stomach ulcers   . Hyperlipidemia   . Hypertension   . Iron deficiency anemia   . Myocardial infarction ([place]) 10/2014  . Peripheral vascular disease ([place])   . Pneumonia   . Presence of permanent cardiac pacemaker   . 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 Type II diabetes mellitus ([place])     Past Surgical History:  Procedure Laterality Date  . AV FISTULA PLACEMENT Left 10/19/2014   Procedure: BRACHIOCEPHALIC ARTERIOVENOUS (AV) FISTULA CREATION ;  Surgeon: Conrad Lake Magdalene, MD;  Location: [place];  Service: Vascular;  Laterality: Left;  . CARDIAC CATHETERIZATION    . CARDIAC CATHETERIZATION N/A 07/22/2015   Procedure: Right/Left Heart Cath and Coronary Angiography;  Surgeon: [name], MD;  Location: [place] CV LAB;  Service: Cardiovascular;  Laterality: N/A;  . CATARACT EXTRACTION W/ INTRAOCULAR LENS  IMPLANT, BILATERAL Bilateral 1990's  . CHOLECYSTECTOMY OPEN  1980's  . COLONOSCOPY W/ BIOPSIES AND POLYPECTOMY    . CORONARY ANGIOGRAPHY N/A 07/31/2018   Procedure: CORONARY ANGIOGRAPHY;  Surgeon: Martinique, Peter M, MD;  Location: [place] [facility] CV LAB;  Service: Cardiovascular;   Laterality: N/A;  . CORONARY ANGIOPLASTY    . CORONARY ATHERECTOMY N/A 04/18/2018   Procedure: CORONARY ATHERECTOMY;  Surgeon: [name], MD;  Location: [place] CV LAB;  Service: Cardiovascular;  Laterality: N/A;  . CORONARY BALLOON ANGIOPLASTY N/A 07/31/2018   Procedure: CORONARY BALLOON ANGIOPLASTY;  Surgeon: Martinique, Peter M, MD;  Location: [place] CV LAB;  Service: Cardiovascular;  Laterality: N/A;  . CORONARY STENT INTERVENTION N/A 04/18/2018   Procedure: CORONARY STENT INTERVENTION;  Surgeon: [name], MD;  Location: [place] CV LAB;  Service: Cardiovascular;  Laterality: N/A;  . EP IMPLANTABLE DEVICE N/A 07/11/2015   Procedure: Pacemaker Implant;  Surgeon: [name], MD;  Location: [place] CV LAB;  Service: Cardiovascular;  Laterality: N/A;  . ESOPHAGOGASTRODUODENOSCOPY  08/01/2012   Procedure: ESOPHAGOGASTRODUODENOSCOPY (EGD);  Surgeon: [name], MD;  Location: [facility];  Service: Endoscopy;  Laterality: N/A;  . INSERT / REPLACE / REMOVE PACEMAKER  07/11/2015  . INSERTION OF DIALYSIS CATHETER Right 02/02/2015   Procedure: INSERTION OF DIALYSIS CATHETER  RIGHT INTERNAL JUGULAR;  Surgeon: Mal [name], MD;  Location: [facility];  Service: Vascular;  Laterality: Right;  . LEFT AND RIGHT HEART CATHETERIZATION WITH CORONARY ANGIOGRAM N/A 09/30/2014   Procedure: LEFT AND RIGHT HEART  CATHETERIZATION WITH CORONARY ANGIOGRAM;  Surgeon: [name], MD;  Location: [facility] CATH LAB;  Service: Cardiovascular;  Laterality: N/A;  . LEFT HEART CATH AND CORONARY ANGIOGRAPHY N/A 04/14/2018   Procedure: LEFT HEART CATH AND CORONARY ANGIOGRAPHY;  Surgeon: [name], MD;  Location: [place] CV LAB;  Service: Cardiovascular;  Laterality: N/A;  . TEE WITHOUT CARDIOVERSION N/A 08/02/2015   Procedure: TRANSESOPHAGEAL ECHOCARDIOGRAM (TEE);  Surgeon: [name], MD;  Location: [place];  Service: Open Heart Surgery;  Laterality: N/A;  . TONSILLECTOMY    .  TRANSCATHETER AORTIC VALVE REPLACEMENT, TRANSFEMORAL Left 08/02/2015   Procedure: TRANSCATHETER AORTIC VALVE REPLACEMENT, TRANSFEMORAL;  Surgeon: [name], MD;  Location: [place];  Service: Open Heart Surgery;  Laterality: Left;     reports that he quit smoking about 35 years ago. He has a 64.00 pack-year smoking history. He has never used smokeless tobacco. He reports current alcohol use. He reports that he does not use drugs.  Allergies  Allergen Reactions  . Byetta 10 Mcg Pen [Exenatide] Diarrhea and Nausea And Vomiting  . Codeine Itching  . Coumadin [Warfarin Sodium] Rash    Family History  Problem Relation Age of Onset  . Diabetes Father   . Heart disease Father   . Diabetes Sister   . Alzheimer's disease Mother   . Stroke Mother   . Diabetes Paternal Grandmother   . Alzheimer's disease Maternal Aunt        x 8 Maternal Aunts  . Cancer Maternal Uncle        type unknown    Prior to Admission medications   Medication Sig Start Date End Date Taking? Authorizing Provider  albuterol (PROVENTIL HFA;VENTOLIN HFA) 108 (90 Base) MCG/ACT inhaler Inhale 2 puffs into the lungs every 4 (four) hours as needed for wheezing or shortness of breath. 11/27/18  Yes Tanda Rockers, MD  apixaban (ELIQUIS) 5 MG TABS tablet Take 1 tablet (5 mg total) by mouth 2 (two) times daily with a meal. 09/02/18  Yes Deboraha Sprang, MD  atorvastatin (LIPITOR) 80 MG tablet Take 1 tablet by mouth daily. 10/29/18  Yes [provider]  budesonide-formoterol (SYMBICORT) 160-4.5 MCG/ACT inhaler Inhale 2 puffs into the lungs 2 (two) times daily. 11/27/18  Yes Tanda Rockers, MD  cefdinir (OMNICEF) 300 MG capsule Take 1 capsule (300 mg total) by mouth 2 (two) times daily. 11/27/18  Yes Tanda Rockers, MD  cinacalcet (SENSIPAR) 30 MG tablet Take 30 mg by mouth daily with supper.   Yes [provider]  clopidogrel (PLAVIX) 75 MG tablet Take 1 tablet (75 mg total) by mouth daily. 09/02/18  Yes Martinique, Peter  M, MD  insulin detemir (LEVEMIR) 100 UNIT/ML injection Inject 0.5 mLs (50 Units total) into the skin at bedtime. 04/19/18  Yes Rai, Ripudeep K, MD  lidocaine-prilocaine (EMLA) cream Apply 1 application topically once as needed (prior to accessing port).    Yes [provider]  metoprolol tartrate (LOPRESSOR) 25 MG tablet One half twice daily Patient taking differently: Take 12.5 mg by mouth 2 (two) times daily.  11/27/18  Yes Tanda Rockers, MD  OXYGEN Inhale into the lungs. 2-3 lpm 24/7   Yes [provider]  pantoprazole (PROTONIX) 40 MG tablet Take 1 tablet (40 mg total) by mouth daily. 08/29/18  Yes Deboraha Sprang, MD  predniSONE (DELTASONE) 10 MG tablet Take  4 each am x 2 days,   2 each am x 2 days,  1 each  am x 2 days and stop 11/27/18  Yes Tanda Rockers, MD  sevelamer carbonate (RENVELA) 800 MG tablet Take 1,600-4,000 mg by mouth See admin instructions. Take 5 tablets by mouth 3 times daily with meals and then 2 tablets twice daily with snacks    Yes [provider]  Insulin Pen Needle 31G X 6 MM MISC USE 1  THREE TIMES DAILY 08/28/18   [provider]  loratadine (CLARITIN) 10 MG tablet Take 1 tablet (10 mg total) by mouth daily. Patient not taking: Reported on 11/29/2018 09/18/18   Fenton Foy, NP  nitroGLYCERIN (NITROSTAT) 0.4 MG SL tablet Place 1 tablet (0.4 mg total) under the tongue every 5 (five) minutes as needed for chest pain. Patient not taking: Reported on 11/27/2018 04/19/18   Rai, Vernelle Emerald, MD  Triamcinolone Acetonide (TRIAMCINOLONE 0.1 % CREAM : EUCERIN) CREA Apply 1 application topically 3 (three) times daily as needed. Apply to arms, back, and other areas with active rash/itching Patient not taking: Reported on 11/29/2018 04/19/18   Mendel Corning, MD    Physical Exam:  Constitutional: Obese male who appears to be ill Vitals:   11/29/18 1820 11/29/18 1823 11/29/18 1900  BP: (!) 119/47  112/68  Pulse: 64  65  Resp: 17  (!) 23  Temp:  98.2 F (36.8 C)    TempSrc: Oral    SpO2: 97%  98%  Weight:  107 kg   Height:  5\' 11"  (1.803 m)    Eyes: PERRL, lids and conjunctivae normal ENMT: Mucous membranes are moist. Posterior pharynx clear of any exudate or lesions.  Neck: normal, supple, no masses, no thyromegaly Respiratory: Mildly tachypneic with coarse breath sounds with expiratory wheezes and rhonchi appreciated of the lower lung fields.  Patient on 3 L nasal cannula oxygen. Cardiovascular: Regular rate and rhythm, no murmurs / rubs / gallops.  2+ pitting lower extremity edema. 2+ pedal pulses. No carotid bruits.  Abdomen: no tenderness, no masses palpated. No hepatosplenomegaly. Bowel sounds positive.  Musculoskeletal: no clubbing / cyanosis. No joint deformity upper and lower extremities. Good ROM, no contractures. Normal muscle tone.  Skin: Pallor present.  Bruising noted of upper extremities.   Neurologic: CN 2-12 grossly intact. Sensation intact, DTR normal. Strength 5/5 in all 4.  Psychiatric: Normal judgment and insight. Alert and oriented x 3.  Depressed mood.     Labs on Admission: I have personally reviewed following labs and imaging studies  CBC: Recent Labs  Lab 11/29/18 1854  WBC 6.2  NEUTROABS 5.6  HGB 9.2*  HCT 31.4*  MCV 90.2  PLT 295   Basic Metabolic Panel: Recent Labs  Lab 11/29/18 1854  NA 131*  K 5.6*  CL 95*  CO2 25  GLUCOSE 118*  BUN 26*  CREATININE 4.12*  CALCIUM 9.6   GFR: Estimated Creatinine Clearance: 18.1 mL/min (A) (by C-G formula based on SCr of 4.12 mg/dL (H)). Liver Function Tests: No results for input(s): AST, ALT, ALKPHOS, BILITOT, PROT, ALBUMIN in the last 168 hours. No results for input(s): LIPASE, AMYLASE in the last 168 hours. No results for input(s): AMMONIA in the last 168 hours. Coagulation Profile: No results for input(s): INR, PROTIME in the last 168 hours. Cardiac Enzymes: Recent Labs  Lab 11/29/18 1854  TROPONINI 0.24*   BNP (last 3 results) No  results for input(s): PROBNP in the last 8760 hours. HbA1C: No results for input(s): HGBA1C in the last 72 hours. CBG: No results for input(s): GLUCAP in  the last 168 hours. Lipid Profile: No results for input(s): CHOL, HDL, LDLCALC, TRIG, CHOLHDL, LDLDIRECT in the last 72 hours. Thyroid Function Tests: No results for input(s): TSH, T4TOTAL, FREET4, T3FREE, THYROIDAB in the last 72 hours. Anemia Panel: No results for input(s): VITAMINB12, FOLATE, FERRITIN, TIBC, IRON, RETICCTPCT in the last 72 hours. Urine analysis:    Component Value Date/Time   COLORURINE YELLOW 06/17/2017 [place] 06/17/2017 0547   LABSPEC 1.010 06/17/2017 0547   PHURINE 6.0 06/17/2017 0547   GLUCOSEU >=500 (A) 06/17/2017 0547   HGBUR SMALL (A) 06/17/2017 0547   BILIRUBINUR NEGATIVE 06/17/2017 0547   KETONESUR NEGATIVE 06/17/2017 0547   PROTEINUR 100 (A) 06/17/2017 0547   UROBILINOGEN 0.2 09/28/2014 2257   NITRITE NEGATIVE 06/17/2017 0547   LEUKOCYTESUR TRACE (A) 06/17/2017 0547   Sepsis Labs: No results found for this or any previous visit (from the past 240 hour(s)).   Radiological Exams on Admission: Dg Chest Portable 1 View  Result Date: 11/29/2018 CLINICAL DATA:  Epigastric pain starting yesterday. Fever. Nausea and vomiting. EXAM: PORTABLE CHEST 1 VIEW COMPARISON:  11/27/2018 FINDINGS: Prominent enlargement of the cardiopericardial silhouette . Aortic valve replacement. Dual lead pacer. Atherosclerotic calcification of the aortic arch. Blunted right costophrenic angle similar to prior. The lungs appear otherwise clear. IMPRESSION: 1. Stable appearance of prominent enlargement of the cardiopericardial silhouette without edema. 2. Blunted right costophrenic angle, possibly from a small right pleural effusion. 3. Aortic valve prosthesis. 4.  Aortic Atherosclerosis (ICD10-I70.0). 5. Dual lead pacer. Electronically Signed   By: Van Clines M.D.   On: 11/29/2018 19:16    EKG:  Independently reviewed.  67 bpm with RBBB and LAFB  Assessment/Plan RSV positive, COPD exacerbation, respiratory failure with hypoxia: Patient presents with worsening cough and shortness of breath.  Found to be actively wheezing but maintaining O2 sats on home 3 L.  Chest x-ray concerning for the possibility of a pneumonia.  Patient was empirically started on antibiotics respiratory virus panel came back positive for RSV. - Admit to a cardiac telemetry bed - Follow-up respiratory virus panel  - Follow-up blood and sputum cultures - Add on procalcitonin - Continuous pulse oximetry with nasal cannula oxygen - DuoNebs 4 times daily and as needed for shortness of breath  - Continue empiric antibiotics Rocephin - Brovana and budesonide nebs  Chest pain/elevated troponin: Chronic.  Patient notes intermittent chest pain initial troponin noted to be 0.24.  Patient was given full dose aspirin.  Suspect that this is secondary to demand in setting of ESRD.  Cardiology was consulted. - Trend troponin  CAD/history of NSTEMI: Patient underwent cardiac catheterization on 07/2018 with 2 vessel obstructive CAD.  Stents placed proximal to the mid LAD patent and PTCA of ostial Lcx. - Continue Plavix and statin  Dysphasia: Patient evaluated with recent barium swallow and noted to have areas of narrowing of the esophagus followed by Dr. Havery Moros.  Unable to do EGD at this time due to need of continuation of anticoagulation - Aspiration precautions - Soft diet(carb modified) - May consider speech therapy consult   Diarrhea: Question secondary to RSV.  With recent antibiotics also question C. difficile - Continue to monitor and check stool studies if needed  ESRD on HD: Patient normally Monday Wednesday Friday dialysis, but had been switched due to the holidays to a Tuesday Thursday Saturday.  Patient's last dialysis session was on 1/3.  Initially found to have mildly elevated potassium 5.6. - Continue  Sensipar and Renvela -  Will need to consult nephrology in a.m. for possible need to have dialysis  Combined systolic and diastolic CHF: Acute on chronic.  BNP elevated at 7.92.7.  Physical exam patient has lower extremity swelling last echocardiogram noted EF of 45 to 50% with akinesis of the apicalanterior, lateral, inferior, and apical myocardium. - Strict I&O's  - Daily weights  A. fib on chronic anticoagulation - Continue Eliquis  Hyperkalemia: Initial potassium elevated at 5.6. - Recheck potassium  Diabetes mellitus type 2 - Hypoglycemia protocol - Continue Levemir at bedtime - CBGs q. before meals and at bedtime  - Sensitive SSI with meals  Anemia chronic disease hemoglobin: Hemoglobin 9.2 which appears near patient's baseline.  Patient denies any reports of bleeding.- Continue to monitor  Hyperlipidemia - Continue atorvastatin  GERD - Continue Protonix  DVT prophylaxis: Lovenox Code Status: Full Family Communication: Discussed plan of care with patient family present bedside Disposition Plan: Likely discharge home in 2 to 3 days Consults called: cards Admission status: Inpatient  Norval Morton MD [facility] Pager [phone]   If 7PM-7AM, please contact night-coverage [email or web address] Password [email or web address]  11/29/2018, 8:22 PM

## 2018-11-29 NOTE — ED Triage Notes (Signed)
Pt arrives via EMS from home with reports of epigastric pain since yesterday, fever for 2-3 days, cough for one month with green sputum, N/V for month. Pt had HD today with full treatment. 200 cc NS given by EMS. Pt states he is on abx but not sure why, his wife takes care of all his medicines. Home O2 of 3L

## 2018-11-29 NOTE — ED Provider Notes (Signed)
[facility] EMERGENCY DEPARTMENT Provider Note   CSN: [id number] Arrival date & time: 11/29/18  1808     History   Chief Complaint Chief Complaint  Patient presents with  . Fever  . Cough    HPI Jeffrey [name] is a 78 y.o. male.  Patient with a history of CHF, heart attack, aortic valve replacement, high blood pressure, stroke, end-stage renal disease on dialysis last dialyzed today full treatment.  Patient was seen the past 2 days for evaluation was started on cefdinir for lung infection.  Today patient had new left anterior lateral chest and rib tenderness, worsening cough productive yellow-green sputum.  Low-grade fever.  Patient on oxygen 3 L at home.  Patient denies any known lung disease.  Patient was placed on prednisone as well.     Past Medical History:  Diagnosis Date  . Anginal pain ([place])   . Aortic stenosis    a. severe by echo 09/2014  . Atrial fibrillation ([place])    a. not well documented, not on anticoagulation  . CHF (congestive heart failure) ([place])    04/28/17 echo-EF 40%, mod LVH, diastolic dysfunction  . Claustrophobia   . Complete heart block ([place])   . COPD (chronic obstructive pulmonary disease) ([place])   . Coronary artery disease    a. chronically occluded RCA per cath 09/2014 with collaterals B. cath 05/01/17 chr occ RCA w/collaterals, 60-70% mid LAD,   . CVA (cerebral vascular accident) ([place]) 10/2014   denies residual on 07/11/2015  . ESRD (end stage renal disease) on dialysis [facility])    a. on dialysis; [place] [facility]; MWF, LUE fistula (07/11/2015)  . History of blood transfusion    "related to gallbladder OR"  . History of stomach ulcers   . Hyperlipidemia   . Hypertension   . Iron deficiency anemia   . Myocardial infarction ([place]) 10/2014  . Peripheral vascular disease ([place])   . Pneumonia   . Presence of permanent cardiac pacemaker   . 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 Type II diabetes mellitus [facility])     Patient Active Problem List   Diagnosis Date Noted  . Chronic respiratory failure with hypoxia ([place]) 10/29/2018  . Respiratory distress 07/27/2018  . GERD (gastroesophageal reflux disease) 06/23/2018  . Cough 06/23/2018  . Chest pain 04/15/2018  . NSTEMI (non-ST elevated myocardial infarction) ([place]) 04/14/2018  . Rash 04/14/2018  . COPD GOLD 0 09/26/2017  . Atrial fibrillation ([place]) [I48.91] 05/21/2017  . Encounter for therapeutic drug monitoring 05/21/2017  . Obesity (BMI 30-39.9) 04/17/2017  . Hyponatremia 03/10/2017  . COPD with acute exacerbation ([place]) 03/10/2017  . Non-healing open wound of left groin 09/05/2015  . Automatic implantable cardioverter-defibrillator in situ 08/08/2015  . 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 Hypervolemia   . Chronic combined systolic and diastolic CHF (congestive heart failure) ([place]) 07/19/2015  . Fluid overload 07/18/2015  . Acute on chronic respiratory failure with hypoxia ([place]) 07/18/2015  . 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/2015  . Atherosclerosis of native coronary artery of native heart with angina pectoris ([name])   . Protein-calorie malnutrition, severe ([place]) 01/14/2015  . ESRD (end stage renal disease) on dialysis ([place]) 01/12/2015  . Mobitz type 1 second degree AV block   . Elevated troponin 09/30/2014  . History of stroke 09/29/2014  . Type 2 diabetes mellitus with chronic  kidney disease on chronic dialysis, with long-term current use of insulin ([place]) 09/29/2014  . CAD (coronary artery disease) 09/29/2014  . Essential hypertension   . History of tobacco use 11/21/2012  . Hyperlipidemia 01/19/2010  . Arthritis, degenerative 01/19/2010  . Allergic rhinitis 12/05/2009  . Idiopathic peripheral neuropathy 01/05/2009  . 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  .  Absolute anemia 11/13/2007    Past Surgical History:  Procedure Laterality Date  . AV FISTULA PLACEMENT Left 10/19/2014   Procedure: BRACHIOCEPHALIC ARTERIOVENOUS (AV) FISTULA CREATION ;  Surgeon: Conrad [place], MD;  Location: [facility];  Service: Vascular;  Laterality: Left;  . CARDIAC CATHETERIZATION    . CARDIAC CATHETERIZATION N/A 07/22/2015   Procedure: Right/Left Heart Cath and Coronary Angiography;  Surgeon: [name], MD;  Location: [facility] CV LAB;  Service: Cardiovascular;  Laterality: N/A;  . CATARACT EXTRACTION W/ INTRAOCULAR LENS  IMPLANT, BILATERAL Bilateral 1990's  . CHOLECYSTECTOMY OPEN  1980's  . COLONOSCOPY W/ BIOPSIES AND POLYPECTOMY    . CORONARY ANGIOGRAPHY N/A 07/31/2018   Procedure: CORONARY ANGIOGRAPHY;  Surgeon: Martinique, Peter M, MD;  Location: [facility] CV LAB;  Service: Cardiovascular;  Laterality: N/A;  . CORONARY ANGIOPLASTY    . CORONARY ATHERECTOMY N/A 04/18/2018   Procedure: CORONARY ATHERECTOMY;  Surgeon: [name], MD;  Location: [facility] CV LAB;  Service: Cardiovascular;  Laterality: N/A;  . CORONARY BALLOON ANGIOPLASTY N/A 07/31/2018   Procedure: CORONARY BALLOON ANGIOPLASTY;  Surgeon: Martinique, Peter M, MD;  Location: [place] [facility] CV LAB;  Service: Cardiovascular;  Laterality: N/A;  . CORONARY STENT INTERVENTION N/A 04/18/2018   Procedure: CORONARY STENT INTERVENTION;  Surgeon: [name], MD;  Location: [facility] CV LAB;  Service: Cardiovascular;  Laterality: N/A;  . EP IMPLANTABLE DEVICE N/A 07/11/2015   Procedure: Pacemaker Implant;  Surgeon: [name], MD;  Location: [facility] [facility] CV LAB;  Service: Cardiovascular;  Laterality: N/A;  . ESOPHAGOGASTRODUODENOSCOPY  08/01/2012   Procedure: ESOPHAGOGASTRODUODENOSCOPY (EGD);  Surgeon: [name], MD;  Location: [facility];  Service: Endoscopy;  Laterality: N/A;  . INSERT / REPLACE / REMOVE PACEMAKER  07/11/2015  . INSERTION OF DIALYSIS CATHETER Right 02/02/2015    Procedure: INSERTION OF DIALYSIS CATHETER  RIGHT INTERNAL JUGULAR;  Surgeon: Mal [name], MD;  Location: [facility];  Service: Vascular;  Laterality: Right;  . LEFT AND RIGHT HEART CATHETERIZATION WITH CORONARY ANGIOGRAM N/A 09/30/2014   Procedure: LEFT AND RIGHT HEART CATHETERIZATION WITH CORONARY ANGIOGRAM;  Surgeon: [name], MD;  Location[facility] C[facility] LAB;  Service: Cardiovascular;  Laterality: N/A;  . LEFT HEART CATH AND CORONARY ANGIOGRAPHY N/A 04/14/2018   Procedure: LEFT HEART CATH AND CORONARY ANGIOGRAPHY;  Surgeon: [name], MD;  Location: [facility] CV LAB;  Service: Cardiovascular;  Laterality: N/A;  . TEE WITHOUT CARDIOVERSION N/A 08/02/2015   Procedure: TRANSESOPHAGEAL ECHOCARDIOGRAM (TEE);  Surgeon: [name], MD;  Location: [facility];  Service: Open Heart Surgery;  Laterality: N/A;  . TONSILLECTOMY    . TRANSCATHETER AORTIC VALVE REPLACEMENT, TRANSFEMORAL Left 08/02/2015   Procedure: TRANSCATHETER AORTIC VALVE REPLACEMENT, TRANSFEMORAL;  Surgeon: [name], MD;  Location: [facility];  Service: Open Heart Surgery;  Laterality: Left;        Home Medications    Prior to Admission medications   Medication Sig Start Date End Date Taking? Authorizing Provider  albuterol (PROVENTIL HFA;VENTOLIN HFA) 108 (90 Base) MCG/ACT inhaler Inhale 2 puffs into the lungs every 4 (four) hours as needed for wheezing  or shortness of breath. 11/27/18  Yes Tanda Rockers, MD  apixaban (ELIQUIS) 5 MG TABS tablet Take 1 tablet (5 mg total) by mouth 2 (two) times daily with a meal. 09/02/18  Yes Deboraha Sprang, MD  atorvastatin (LIPITOR) 80 MG tablet Take 1 tablet by mouth daily. 10/29/18  Yes [provider]  budesonide-formoterol (SYMBICORT) 160-4.5 MCG/ACT inhaler Inhale 2 puffs into the lungs 2 (two) times daily. 11/27/18  Yes Tanda Rockers, MD  cefdinir (OMNICEF) 300 MG capsule Take 1 capsule (300 mg total) by mouth 2 (two) times daily. 11/27/18  Yes Tanda Rockers, MD  cinacalcet  (SENSIPAR) 30 MG tablet Take 30 mg by mouth daily with supper.   Yes [provider]  clopidogrel (PLAVIX) 75 MG tablet Take 1 tablet (75 mg total) by mouth daily. 09/02/18  Yes Martinique, Peter M, MD  insulin detemir (LEVEMIR) 100 UNIT/ML injection Inject 0.5 mLs (50 Units total) into the skin at bedtime. 04/19/18  Yes Rai, Ripudeep K, MD  lidocaine-prilocaine (EMLA) cream Apply 1 application topically once as needed (prior to accessing port).    Yes [provider]  metoprolol tartrate (LOPRESSOR) 25 MG tablet One half twice daily Patient taking differently: Take 12.5 mg by mouth 2 (two) times daily.  11/27/18  Yes Tanda Rockers, MD  OXYGEN Inhale into the lungs. 2-3 lpm 24/7   Yes [provider]  pantoprazole (PROTONIX) 40 MG tablet Take 1 tablet (40 mg total) by mouth daily. 08/29/18  Yes Deboraha Sprang, MD  predniSONE (DELTASONE) 10 MG tablet Take  4 each am x 2 days,   2 each am x 2 days,  1 each am x 2 days and stop 11/27/18  Yes Tanda Rockers, MD  sevelamer carbonate (RENVELA) 800 MG tablet Take 1,600-4,000 mg by mouth See admin instructions. Take 5 tablets by mouth 3 times daily with meals and then 2 tablets twice daily with snacks    Yes [provider]  Insulin Pen Needle 31G X 6 MM MISC USE 1  THREE TIMES DAILY 08/28/18   [provider]  loratadine (CLARITIN) 10 MG tablet Take 1 tablet (10 mg total) by mouth daily. Patient not taking: Reported on 11/29/2018 09/18/18   Fenton Foy, NP  nitroGLYCERIN (NITROSTAT) 0.4 MG SL tablet Place 1 tablet (0.4 mg total) under the tongue every 5 (five) minutes as needed for chest pain. Patient not taking: Reported on 11/27/2018 04/19/18   Rai, Vernelle Emerald, MD  Triamcinolone Acetonide (TRIAMCINOLONE 0.1 % CREAM : EUCERIN) CREA Apply 1 application topically 3 (three) times daily as needed. Apply to arms, back, and other areas with active rash/itching Patient not taking: Reported on 11/29/2018 04/19/18   Mendel Corning, MD    Family History Family History  Problem Relation Age of Onset  . Diabetes Father   . Heart disease Father   . Diabetes Sister   . Alzheimer's disease Mother   . Stroke Mother   . Diabetes Paternal Grandmother   . Alzheimer's disease Maternal Aunt        x 8 Maternal Aunts  . Cancer Maternal Uncle        type unknown    Social History Social History   Tobacco Use  . Smoking status: Former Smoker    Packs/day: 2.00    Years: 32.00    Pack years: 64.00    Last attempt to quit: 11/27/1983    Years since quitting: 35.0  .  Smokeless tobacco: Never Used  Substance Use Topics  . Alcohol use: Yes    Comment: rarely  . Drug use: No     Allergies   Byetta 10 mcg pen [exenatide]; Codeine; and Coumadin [warfarin sodium]   Review of Systems Review of Systems  Constitutional: Positive for appetite change and fever. Negative for chills.  HENT: Negative for congestion.   Eyes: Negative for visual disturbance.  Respiratory: Positive for cough and shortness of breath.   Cardiovascular: Positive for chest pain.  Gastrointestinal: Negative for abdominal pain and vomiting.  Genitourinary: Negative for dysuria and flank pain.  Musculoskeletal: Negative for back pain, neck pain and neck stiffness.  Skin: Negative for rash.  Neurological: Negative for light-headedness and headaches.     Physical Exam Updated Vital Signs BP (!) 116/42   Pulse 71   Temp 98.2 F (36.8 C) (Oral)   Resp (!) 30   Ht 5\' 11"  (1.803 m)   Wt 107 kg   SpO2 97%   BMI 32.92 kg/m   Physical Exam Vitals signs and nursing note reviewed.  Constitutional:      Appearance: He is well-developed.  HENT:     Head: Normocephalic and atraumatic.     Comments: Dry mucous membranes Eyes:     General:        Right eye: No discharge.        Left eye: No discharge.  Neck:     Musculoskeletal: Normal range of motion and neck supple.     Trachea: No tracheal deviation.  Cardiovascular:     Rate and  Rhythm: Normal rate and regular rhythm.  Pulmonary:     Effort: Pulmonary effort is normal.     Breath sounds: Wheezing and rhonchi present.  Abdominal:     General: There is no distension.     Palpations: Abdomen is soft.     Tenderness: There is no abdominal tenderness. There is no guarding.  Musculoskeletal:     Right lower leg: Edema present.     Left lower leg: Edema present.  Skin:    General: Skin is warm.     Findings: No rash.  Neurological:     Mental Status: He is alert and oriented to person, place, and time.      ED Treatments / Results  Labs (all labs ordered are listed, but only abnormal results are displayed) Labs Reviewed  TROPONIN I - Abnormal; Notable for the following components:      Result Value   Troponin I 0.24 (*)    All other components within normal limits  BASIC METABOLIC PANEL - Abnormal; Notable for the following components:   Sodium 131 (*)    Potassium 5.6 (*)    Chloride 95 (*)    Glucose, Bld 118 (*)    BUN 26 (*)    Creatinine, Ser 4.12 (*)    GFR calc non Af Amer 13 (*)    GFR calc Af Amer 15 (*)    All other components within normal limits  CBC WITH DIFFERENTIAL/PLATELET - Abnormal; Notable for the following components:   RBC 3.48 (*)    Hemoglobin 9.2 (*)    HCT 31.4 (*)    MCHC 29.3 (*)    RDW 17.2 (*)    Lymphs Abs 0.3 (*)    All other components within normal limits  BRAIN NATRIURETIC PEPTIDE - Abnormal; Notable for the following components:   B Natriuretic Peptide 792.7 (*)    All other components  within normal limits  RESPIRATORY PANEL BY PCR  URINALYSIS, ROUTINE W REFLEX MICROSCOPIC  I-STAT CG4 LACTIC ACID, ED  I-STAT CG4 LACTIC ACID, ED    EKG EKG Interpretation  Date/Time:  Saturday November 29 2018 18:21:58 EST Ventricular Rate:  67 PR Interval:    QRS Duration: 140 QT Interval:  400 QTC Calculation: 423 R Axis:   -80 Text Interpretation:  Junctional rhythm RBBB and LAFB Abnormal T, consider ischemia,  lateral leads Confirmed by Elnora Morrison [phone]) on 11/29/2018 8:00:15 PM   Radiology Dg Chest Portable 1 View  Result Date: 11/29/2018 CLINICAL DATA:  Epigastric pain starting yesterday. Fever. Nausea and vomiting. EXAM: PORTABLE CHEST 1 VIEW COMPARISON:  11/27/2018 FINDINGS: Prominent enlargement of the cardiopericardial silhouette . Aortic valve replacement. Dual lead pacer. Atherosclerotic calcification of the aortic arch. Blunted right costophrenic angle similar to prior. The lungs appear otherwise clear. IMPRESSION: 1. Stable appearance of prominent enlargement of the cardiopericardial silhouette without edema. 2. Blunted right costophrenic angle, possibly from a small right pleural effusion. 3. Aortic valve prosthesis. 4.  Aortic Atherosclerosis (ICD10-I70.0). 5. Dual lead pacer. Electronically Signed   By: Van Clines M.D.   On: 11/29/2018 19:16    Procedures Procedures (including critical care time)  Medications Ordered in ED Medications  fentaNYL (SUBLIMAZE) injection 25 mcg (has no administration in time range)  aspirin chewable tablet 324 mg (has no administration in time range)  cefTRIAXone (ROCEPHIN) 1 g in sodium chloride 0.9 % 100 mL IVPB (has no administration in time range)  methylPREDNISolone sodium succinate (SOLU-MEDROL) 125 mg/2 mL injection 125 mg (has no administration in time range)  albuterol (PROVENTIL) (2.5 MG/3ML) 0.083% nebulizer solution 5 mg (has no administration in time range)  ipratropium (ATROVENT) nebulizer solution 0.5 mg (has no administration in time range)  albuterol (PROVENTIL) (2.5 MG/3ML) 0.083% nebulizer solution 5 mg (5 mg Nebulization Given 11/29/18 1859)     Initial Impression / Assessment and Plan / ED Course  I have reviewed the triage vital signs and the nursing notes.  Pertinent labs & imaging results that were available during my care of the patient were reviewed by me and considered in my medical decision making (see chart for  details).    Patient with complicated medical history presents with clinically respiratory infection.  Patient on home 3 L nasal cannula.  Patient having productive sputum in the room, wheezing and rales on exam.  Plan for breathing treatment, chest x-ray, blood work and likely admission with worsening symptoms despite outpatient treatment. Resp panel pending.  Patient received repeat nebulizer in the ED for wheezing.  Clinical concern for combination of infectious/COPD.  With productive cough, worsening symptoms, fever at home plan for Rocephin/steroids and admission for further treatment.  Anemia chronic.  Mild elevated potassium, patient will likely need dialysis tomorrow.   Mild troponin elevation, patient has had significant elevation in troponin in the past.  Patient does have complicated medical history and cardiac history.  Plan for aspirin and to follow serial troponins in the hospital.  Discussed with cardiology for consult with plan for medical management.  Discussed with hospitalist. The patients results and plan were reviewed and discussed.   Any x-rays performed were independently reviewed by myself.   Differential diagnosis were considered with the presenting HPI.  Medications  fentaNYL (SUBLIMAZE) injection 25 mcg (has no administration in time range)  aspirin chewable tablet 324 mg (has no administration in time range)  cefTRIAXone (ROCEPHIN) 1 g in sodium chloride 0.9 % 100  mL IVPB (has no administration in time range)  methylPREDNISolone sodium succinate (SOLU-MEDROL) 125 mg/2 mL injection 125 mg (has no administration in time range)  albuterol (PROVENTIL) (2.5 MG/3ML) 0.083% nebulizer solution 5 mg (has no administration in time range)  ipratropium (ATROVENT) nebulizer solution 0.5 mg (has no administration in time range)  albuterol (PROVENTIL) (2.5 MG/3ML) 0.083% nebulizer solution 5 mg (5 mg Nebulization Given 11/29/18 1859)    Vitals:   11/29/18 1945 11/29/18 2000  11/29/18 2015 11/29/18 2030  BP: 99/82 (!) 107/58 (!) 124/99 (!) 116/42  Pulse: 68 71 74 71  Resp: 16 18 (!) 27 (!) 30  Temp:      TempSrc:      SpO2: 100% 98% 99% 97%  Weight:      Height:        Final diagnoses:  Acute chest pain  Troponin level elevated  Acute exacerbation of chronic obstructive pulmonary disease (COPD) (HCC)  ESRD on dialysis ([place])  Hyperkalemia    Admission/ observation were discussed with the admitting physician, patient and/or family and they are comfortable with the plan.    Final Clinical Impressions(s) / ED Diagnoses   Final diagnoses:  Acute chest pain  Troponin level elevated  Acute exacerbation of chronic obstructive pulmonary disease (COPD) ([place])  ESRD on dialysis [facility])  Hyperkalemia    ED Discharge Orders    None       Elnora Morrison, MD 11/29/18 2054

## 2018-11-29 NOTE — Consult Note (Signed)
Cardiology Consult    Patient ID: TERON BLAIS MRN: [id number], DOB/AGE: [birth year]   Admit date: 11/29/2018 Date of Consult: 11/29/2018  Primary Physician: Thurman Coyer, MD Primary Cardiologist: [name], MD Requesting Provider: Eustaquio Boyden    Past Medical History   Past Medical History:  Diagnosis Date  . Anginal pain ([place])   . Aortic stenosis    a. severe by echo 09/2014  . Atrial fibrillation ([place])    a. not well documented, not on anticoagulation  . CHF (congestive heart failure) ([place])    04/28/17 echo-EF 40%, mod LVH, diastolic dysfunction  . Claustrophobia   . Complete heart block ([place])   . COPD (chronic obstructive pulmonary disease) ([place])   . Coronary artery disease    a. chronically occluded RCA per cath 09/2014 with collaterals B. cath 05/01/17 chr occ RCA w/collaterals, 60-70% mid LAD,   . CVA (cerebral vascular accident) ([place]) 10/2014   denies residual on 07/11/2015  . ESRD (end stage renal disease) on dialysis [facility])    a. on dialysis; [facility]; MWF, LUE fistula (07/11/2015)  . History of blood transfusion    "related to gallbladder OR"  . History of stomach ulcers   . Hyperlipidemia   . Hypertension   . Iron deficiency anemia   . Myocardial infarction ([place]) 10/2014  . Peripheral vascular disease ([place])   . Pneumonia   . Presence of permanent cardiac pacemaker   . 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 Type II diabetes mellitus ([place])     Past Surgical History:  Procedure Laterality Date  . AV FISTULA PLACEMENT Left 10/19/2014   Procedure: BRACHIOCEPHALIC ARTERIOVENOUS (AV) FISTULA CREATION ;  Surgeon: Conrad Mescal, MD;  Location: [place];  Service: Vascular;  Laterality: Left;  . CARDIAC CATHETERIZATION    . CARDIAC CATHETERIZATION N/A 07/22/2015   Procedure: Right/Left Heart Cath and Coronary Angiography;  Surgeon: [name], MD;  Location: [place] CV LAB;  Service: Cardiovascular;  Laterality: N/A;  . CATARACT EXTRACTION W/ INTRAOCULAR LENS  IMPLANT, BILATERAL Bilateral 1990's  . CHOLECYSTECTOMY OPEN  1980's  . COLONOSCOPY W/ BIOPSIES AND POLYPECTOMY    . CORONARY ANGIOGRAPHY N/A 07/31/2018   Procedure: CORONARY ANGIOGRAPHY;  Surgeon: Martinique, Peter M, MD;  Location: [place] CV LAB;  Service: Cardiovascular;  Laterality: N/A;  . CORONARY ANGIOPLASTY    . CORONARY ATHERECTOMY N/A 04/18/2018   Procedure: CORONARY ATHERECTOMY;  Surgeon: [name], MD;  Location: [place] CV LAB;  Service: Cardiovascular;  Laterality: N/A;  . CORONARY BALLOON ANGIOPLASTY N/A 07/31/2018   Procedure: CORONARY BALLOON ANGIOPLASTY;  Surgeon: Martinique, Peter M, MD;  Location: [place] CV LAB;  Service: Cardiovascular;  Laterality: N/A;  . CORONARY STENT INTERVENTION N/A 04/18/2018   Procedure: CORONARY STENT INTERVENTION;  Surgeon: [name], MD;  Location: [place] CV LAB;  Service: Cardiovascular;  Laterality: N/A;  . EP IMPLANTABLE DEVICE N/A 07/11/2015   Procedure: Pacemaker Implant;  Surgeon: [name], MD;  Location: [place] CV LAB;  Service: Cardiovascular;  Laterality: N/A;  . ESOPHAGOGASTRODUODENOSCOPY  08/01/2012   Procedure: ESOPHAGOGASTRODUODENOSCOPY (EGD);  Surgeon: [name], MD;  Location: [facility];  Service: Endoscopy;  Laterality: N/A;  . INSERT / REPLACE / REMOVE PACEMAKER  07/11/2015  . INSERTION OF DIALYSIS CATHETER Right 02/02/2015   Procedure: INSERTION OF DIALYSIS CATHETER  RIGHT INTERNAL JUGULAR;  Surgeon: Mal [name], MD;  Location: [place];  Service: Vascular;  Laterality: Right;  . LEFT AND RIGHT HEART CATHETERIZATION WITH CORONARY ANGIOGRAM N/A 09/30/2014   Procedure: LEFT AND RIGHT HEART CATHETERIZATION WITH CORONARY ANGIOGRAM;  Surgeon: [name], MD;  Location: [facility] CATH LAB;  Service: Cardiovascular;  Laterality: N/A;  . LEFT HEART CATH AND  CORONARY ANGIOGRAPHY N/A 04/14/2018   Procedure: LEFT HEART CATH AND CORONARY ANGIOGRAPHY;  Surgeon: [name], MD;  Location: [place] CV LAB;  Service: Cardiovascular;  Laterality: N/A;  . TEE WITHOUT CARDIOVERSION N/A 08/02/2015   Procedure: TRANSESOPHAGEAL ECHOCARDIOGRAM (TEE);  Surgeon: [name], MD;  Location: [place];  Service: Open Heart Surgery;  Laterality: N/A;  . TONSILLECTOMY    . TRANSCATHETER AORTIC VALVE REPLACEMENT, TRANSFEMORAL Left 08/02/2015   Procedure: TRANSCATHETER AORTIC VALVE REPLACEMENT, TRANSFEMORAL;  Surgeon: [name], MD;  Location: [place];  Service: Open Heart Surgery;  Laterality: Left;     Allergies  Allergies  Allergen Reactions  . Byetta 10 Mcg Pen [Exenatide] Diarrhea and Nausea And Vomiting  . Codeine Itching  . Coumadin [Warfarin Sodium] Rash    History of Present Illness    Jeffrey [name] is a 78 y.o. male with medical history significant of HTN, HLD, CAD, combined systolic and diastolic CHF, severe AS s/p TAVR, A. fib not on anticoagulation, PVD, s/p PM, ESRD on HD, DM type II, COPD on home oxygen, and dysphasia  Last hospitalization was from 07/30/2018-08/01/2018 for NSTEMI.  Patient underwent cardiac catheterization which revealed severe 2 vessel obstructive CAD.  Stents placed proximal to the mid LAD patent, PTCA of ostial Lcx. Cardiology recommended uninterrupted aspirin 81mg  and plavix 75mg  for minimum of 12 months. His baseline troponin level appears to be around 0.2-0.3.  He presents with symptoms of shortness of breath chest pain and cough with fever for about a week.  This has been acutely getting worse over the last 24 hours.  Brought in due to concern by family.  Denies any persistent chest pain and denies any chest pain at this time point in the room.  Has been coughing a lot and thinks that this might also be due to strain of the chest wall.  Pain is nonradiating and not worse with pushing on the chest and the left side   ED  Course: On admission into the emergency department patient was seen to be afebrile, pulse 64-65, respirations 17-23, blood pressure 112/68-119/47, and O2 saturation 97-98%.  Labs revealed WBC 6.2, hemoglobin 9.2, sodium 131 potassium 4.6, chloride 95, BUN 26, creatinine 4.12, lactic acid 1.05, troponin 0.24, and BNP 792.7.  Chest x-ray showing stable large pericardial silhouette with blunted right costophrenic angle.  Cardiac History 07/2018 Left heart cath  1. Severe 2 vessel obstructive CAD    - 95% in stent restenosis of the ostial LCx    - 100% CTO of the RCA 2. Stent in the proximal to mid LAD is patent 3. Successful scoring balloon/PTCA of the ostial LCx  5/23019-echo Left ventricle: The cavity size was normal. There was moderate   concentric hypertrophy. Systolic function was mildly reduced. The   estimated ejection fraction was in the range of 45% to 50%. There   is akinesis of the apicalanterior, lateral, inferior, and apical   myocardium. The study is not technically sufficient to allow   evaluation of LV diastolic function. - Aortic valve: Bioprosthetic valve s/p TAVR with normal valve   function. There was mild-moderate perivalvular regurgitation.  Mean gradient (S): 8 mm Hg. Valve area (VTI): 1.25 cm^2. Valve   area (Vmax): 1.08 cm^2. Valve area (Vmean): 1.12 cm^2.   Regurgitation pressure half-time: 367 ms.  Inpatient Medications    . albuterol  5 mg Nebulization Once  . aspirin  324 mg Oral Once  . ipratropium  0.5 mg Nebulization Once  . methylPREDNISolone (SOLU-MEDROL) injection  125 mg Intravenous Once    Family History    Family History  Problem Relation Age of Onset  . Diabetes Father   . Heart disease Father   . Diabetes Sister   . Alzheimer's disease Mother   . Stroke Mother   . Diabetes Paternal Grandmother   . Alzheimer's disease Maternal Aunt        x 8 Maternal Aunts  . Cancer Maternal Uncle        type unknown   He indicated that his mother  is deceased. He indicated that his father is deceased. He indicated that his sister is alive. He indicated that his maternal grandmother is deceased. He indicated that his maternal grandfather is deceased. He indicated that his paternal grandmother is deceased. He indicated that his paternal grandfather is deceased. He indicated that his daughter is alive. He indicated that both of his sons are alive. He indicated that the status of his maternal aunt is unknown. He indicated that the status of his maternal uncle is unknown.   Social History    Social History   Socioeconomic History  . Marital status: Married    Spouse name: Not on file  . Number of children: 3  . Years of education: Not on file  . Highest education level: Not on file  Occupational History  . Occupation: retired  Scientific laboratory technician  . Financial resource strain: Not on file  . Food insecurity:    Worry: Not on file    Inability: Not on file  . Transportation needs:    Medical: Not on file    Non-medical: Not on file  Tobacco Use  . Smoking status: Former Smoker    Packs/day: 2.00    Years: 32.00    Pack years: 64.00    Last attempt to quit: 11/27/1983    Years since quitting: 35.0  . Smokeless tobacco: Never Used  Substance and Sexual Activity  . Alcohol use: Yes    Comment: rarely  . Drug use: No  . Sexual activity: Yes    Birth control/protection: None  Lifestyle  . Physical activity:    Days per week: Not on file    Minutes per session: Not on file  . Stress: Not on file  Relationships  . Social connections:    Talks on phone: Not on file    Gets together: Not on file    Attends religious service: Not on file    Active member of club or organization: Not on file    Attends meetings of clubs or organizations: Not on file    Relationship status: Not on file  . Intimate partner violence:    Fear of current or ex partner: Not on file    Emotionally abused: Not on file    Physically abused: Not on file     Forced sexual activity: Not on file  Other Topics Concern  . Not on file  Social History Narrative  . Not on file     Review of Systems    General:  No chills, fever, night sweats or weight changes.  Cardiovascular:  No chest pain, dyspnea on exertion, edema, orthopnea, palpitations, paroxysmal nocturnal dyspnea. Dermatological: No rash, lesions/masses Respiratory: No cough, dyspnea Urologic: No hematuria, dysuria Abdominal:   No nausea, vomiting, diarrhea, bright red blood per rectum, melena, or hematemesis Neurologic:  No visual changes, wkns, changes in mental status. All other systems reviewed and are otherwise negative except as noted above.  Physical Exam    Blood pressure 112/68, pulse 65, temperature 98.2 F (36.8 C), temperature source Oral, resp. rate (!) 23, height 5\' 11"  (1.803 m), weight 107 kg, SpO2 98 %.  General: Nonrebreather is on Psych: Normal affect. Neuro: Alert and oriented X 3. Moves all extremities spontaneously. HEENT: Normal  Neck: Mild jugular venous distention Lungs:  Resp regular and unlabored, CTA. Heart: RRR no s3, s4, or murmurs. Abdomen: Soft, non-tender, non-distended, BS + x 4.  Extremities: No clubbing, cyanosis or edema. DP/PT/Radials 2+ and equal bilaterally.  Labs    Troponin (Point of Care Test) No results for input(s): TROPIPOC in the last 72 hours. Recent Labs    11/29/18 1854  TROPONINI 0.24*   Lab Results  Component Value Date   WBC 6.2 11/29/2018   HGB 9.2 (L) 11/29/2018   HCT 31.4 (L) 11/29/2018   MCV 90.2 11/29/2018   PLT 315 11/29/2018    Recent Labs  Lab 11/29/18 1854  NA 131*  K 5.6*  CL 95*  CO2 25  BUN 26*  CREATININE 4.12*  CALCIUM 9.6  GLUCOSE 118*   Lab Results  Component Value Date   CHOL 80 04/15/2018   HDL 22 (L) 04/15/2018   LDLCALC 22 04/15/2018   TRIG 179 (H) 04/15/2018   No results found for: [facility]   Radiology Studies    Dg Chest 2 View  Result Date: 11/27/2018 CLINICAL DATA:  Cough  with dyspnea, wheezing and congestion. EXAM: CHEST - 2 VIEW COMPARISON:  09/18/2018. FINDINGS: Cardiomegaly. Calcified tortuous aorta. Dual lead pacer RIGHT subclavian approach. TAVR. Moderate vascular congestion. Low lung volumes. Small BILATERAL effusions. IMPRESSION: Cardiomegaly. Moderate vascular congestion. Low lung volumes. No active infiltrates or failure. Worsening aeration from priors. Electronically Signed   By: Staci Righter M.D.   On: 11/27/2018 16:07   Ct Head Wo Contrast  Result Date: 11/05/2018 CLINICAL DATA:  78 year old male with head trauma. EXAM: CT HEAD WITHOUT CONTRAST TECHNIQUE: Contiguous axial images were obtained from the base of the skull through the vertex without intravenous contrast. COMPARISON:  Head CT dated 09/28/2014 FINDINGS: Brain: There is mild age-related atrophy and chronic microvascular ischemic changes. There is no acute intracranial hemorrhage. No mass effect or midline shift. No extra-axial fluid collection. Vascular: No hyperdense vessel or unexpected calcification. Skull: Normal. Negative for fracture or focal lesion. Sinuses/Orbits: No acute finding. Other: None IMPRESSION: 1. No acute intracranial hemorrhage. 2. Mild age-related atrophy and chronic microvascular ischemic changes. Electronically Signed   By: Anner Crete M.D.   On: 11/05/2018 05:49   Dg Chest Portable 1 View  Result Date: 11/29/2018 CLINICAL DATA:  Epigastric pain starting yesterday. Fever. Nausea and vomiting. EXAM: PORTABLE CHEST 1 VIEW COMPARISON:  11/27/2018 FINDINGS: Prominent enlargement of the cardiopericardial silhouette . Aortic valve replacement. Dual lead pacer. Atherosclerotic calcification of the aortic arch. Blunted right costophrenic angle similar to prior. The lungs appear otherwise clear. IMPRESSION: 1. Stable appearance of prominent enlargement of the cardiopericardial silhouette without edema. 2. Blunted right costophrenic angle, possibly from a small right pleural  effusion. 3. Aortic valve prosthesis. 4.  Aortic Atherosclerosis (ICD10-I70.0). 5. Dual lead  pacer. Electronically Signed   By: Van Clines M.D.   On: 11/29/2018 19:16    ECG & Cardiac Imaging    ECG with sinus rhythm/junctional rhythm.  No evidence of acute ischemia  Assessment & Plan    ACS: Known end-stage renal disease with baseline elevation of troponin.  History of CAD with multiple PCI.  As recently as 2019.  Patient presents with evidence of what appears to be likely viral pneumonia his main problem right now is his respiratory status.  Left-sided chest pain could be due to his persistent coughing for a few days.  However given his known CAD this could also be an exacerbation of his underlying complex coronary artery disease.  Plan -He had his last dose of apixaban this morning.  Would hold off heparin at this time point.  Aspirin ready given.  Can consider starting heparin tomorrow especially if troponins continue to rise. -Troponins -Would avoid intravenous hydration given underlying cardiomyopathy.  He is to be hemodynamic stable at this time point  Signed, Cristina Gong, MD 11/29/2018, 8:37 PM  For questions or updates, please contact   Please consult [email or web address] for contact info under Cardiology/STEMI.

## 2018-11-29 NOTE — ED Notes (Signed)
ED Provider at bedside. 

## 2018-11-30 ENCOUNTER — Encounter (HOSPITAL_COMMUNITY): Payer: Self-pay

## 2018-11-30 ENCOUNTER — Other Ambulatory Visit: Payer: Self-pay

## 2018-11-30 DIAGNOSIS — R778 Other specified abnormalities of plasma proteins: Secondary | ICD-10-CM

## 2018-11-30 DIAGNOSIS — B338 Other specified viral diseases: Secondary | ICD-10-CM | POA: Diagnosis present

## 2018-11-30 DIAGNOSIS — N186 End stage renal disease: Secondary | ICD-10-CM

## 2018-11-30 DIAGNOSIS — R071 Chest pain on breathing: Secondary | ICD-10-CM

## 2018-11-30 DIAGNOSIS — Z992 Dependence on renal dialysis: Secondary | ICD-10-CM

## 2018-11-30 DIAGNOSIS — B974 Respiratory syncytial virus as the cause of diseases classified elsewhere: Secondary | ICD-10-CM | POA: Diagnosis present

## 2018-11-30 DIAGNOSIS — R7989 Other specified abnormal findings of blood chemistry: Secondary | ICD-10-CM

## 2018-11-30 DIAGNOSIS — I4821 Permanent atrial fibrillation: Secondary | ICD-10-CM

## 2018-11-30 LAB — CBC
HCT: 31.2 % — ABNORMAL LOW (ref 39.0–52.0)
Hemoglobin: 9 g/dL — ABNORMAL LOW (ref 13.0–17.0)
MCH: 26.4 pg (ref 26.0–34.0)
MCHC: 28.8 g/dL — ABNORMAL LOW (ref 30.0–36.0)
MCV: 91.5 fL (ref 80.0–100.0)
Platelets: 303 10*3/uL (ref 150–400)
RBC: 3.41 MIL/uL — ABNORMAL LOW (ref 4.22–5.81)
RDW: 17.1 % — ABNORMAL HIGH (ref 11.5–15.5)
WBC: 5.7 10*3/uL (ref 4.0–10.5)
nRBC: 0 % (ref 0.0–0.2)

## 2018-11-30 LAB — RENAL FUNCTION PANEL
Albumin: 2.4 g/dL — ABNORMAL LOW (ref 3.5–5.0)
Anion gap: 16 — ABNORMAL HIGH (ref 5–15)
BUN: 36 mg/dL — ABNORMAL HIGH (ref 8–23)
CO2: 22 mmol/L (ref 22–32)
Calcium: 9.8 mg/dL (ref 8.9–10.3)
Chloride: 95 mmol/L — ABNORMAL LOW (ref 98–111)
Creatinine, Ser: 4.94 mg/dL — ABNORMAL HIGH (ref 0.61–1.24)
GFR calc Af Amer: 12 mL/min — ABNORMAL LOW (ref >60)
GFR calc non Af Amer: 10 mL/min — ABNORMAL LOW (ref >60)
Glucose, Bld: 363 mg/dL — ABNORMAL HIGH (ref 70–99)
Phosphorus: 5.7 mg/dL — ABNORMAL HIGH (ref 2.5–4.6)
Potassium: 5.3 mmol/L — ABNORMAL HIGH (ref 3.5–5.1)
Sodium: 133 mmol/L — ABNORMAL LOW (ref 135–145)

## 2018-11-30 LAB — GLUCOSE, CAPILLARY
Glucose-Capillary: 378 mg/dL — ABNORMAL HIGH (ref 70–99)
Glucose-Capillary: 289 mg/dL — ABNORMAL HIGH (ref 70–99)
Glucose-Capillary: 243 mg/dL — ABNORMAL HIGH (ref 70–99)

## 2018-11-30 LAB — CBG MONITORING, ED: Glucose-Capillary: 331 mg/dL — ABNORMAL HIGH (ref 70–99)

## 2018-11-30 LAB — MRSA PCR SCREENING: MRSA by PCR: NEGATIVE

## 2018-11-30 LAB — TROPONIN I
Troponin I: 0.2 ng/mL (ref <0.03)
Troponin I: 0.16 ng/mL (ref <0.03)
Troponin I: 1.15 ng/mL (ref <0.03)

## 2018-11-30 LAB — PROCALCITONIN: Procalcitonin: 0.54 ng/mL

## 2018-11-30 LAB — EXPECTORATED SPUTUM ASSESSMENT W GRAM STAIN, RFLX TO RESP C

## 2018-11-30 LAB — EXPECTORATED SPUTUM ASSESSMENT W REFEX TO RESP CULTURE

## 2018-11-30 MED ORDER — NITROGLYCERIN 0.4 MG SL SUBL
SUBLINGUAL_TABLET | SUBLINGUAL | Status: AC
  Filled 2018-11-30: qty 3

## 2018-11-30 MED ORDER — NITROGLYCERIN 0.4 MG SL SUBL
0.4000 mg | SUBLINGUAL_TABLET | SUBLINGUAL | Status: DC | PRN
  Administered 2018-11-30 – 2018-12-02 (×6): 0.4 mg via SUBLINGUAL
  Filled 2018-11-30: qty 1

## 2018-11-30 MED ORDER — NITROGLYCERIN 2 % TD OINT
1.0000 [in_us] | TOPICAL_OINTMENT | Freq: Once | TRANSDERMAL | Status: AC
  Administered 2018-11-30: 1 [in_us] via TOPICAL
  Filled 2018-11-30: qty 1

## 2018-11-30 NOTE — Progress Notes (Signed)
Progress Note  Patient Name: Jeffrey [name] Date of Encounter: 11/30/2018  Primary Cardiologist: [name], MD   Subjective   Laying in bed, coughing, very weak.  Has had trouble walking because of weakness.  Denies any current chest pain.  Had some chest discomfort earlier with coughing.  Inpatient Medications    Scheduled Meds: . apixaban  5 mg Oral BID  . arformoterol  15 mcg Nebulization BID  . atorvastatin  80 mg Oral Daily  . budesonide (PULMICORT) nebulizer solution  0.5 mg Nebulization BID  . cinacalcet  30 mg Oral Q supper  . clopidogrel  75 mg Oral Daily  . guaiFENesin  600 mg Oral BID  . insulin aspart  0-9 Units Subcutaneous TID WC  . insulin detemir  50 Units Subcutaneous QHS  . ipratropium-albuterol  3 mL Nebulization QID  . methylPREDNISolone (SOLU-MEDROL) injection  60 mg Intravenous BID  . metoprolol tartrate  12.5 mg Oral BID  . pantoprazole  40 mg Oral Daily  . sevelamer carbonate  4,000 mg Oral TID WC   Continuous Infusions:  PRN Meds: acetaminophen **OR** acetaminophen, fentaNYL (SUBLIMAZE) injection, ipratropium-albuterol, lidocaine-prilocaine, nitroGLYCERIN, ondansetron **OR** ondansetron (ZOFRAN) IV, sevelamer carbonate   Vital Signs    Vitals:   11/30/18 0930 11/30/18 1000 11/30/18 1030 11/30/18 1100  BP: 140/62 (!) 142/62 (!) 141/66 (!) 136/58  Pulse: 64 65 66 (!) 58  Resp: (!) 23 19 (!) 23 17  Temp:      TempSrc:      SpO2: 96% 100% 99% 97%  Weight:      Height:        Intake/Output Summary (Last 24 hours) at 11/30/2018 1214 Last data filed at 11/29/2018 2140 Gross per 24 hour  Intake 100 ml  Output -  Net 100 ml   Filed Weights   11/29/18 1823  Weight: 107 kg    Telemetry    Atrial fibrillation underlying, normal rate, pacemaker- Personally Reviewed  ECG    Atrial fibrillation underlying- Personally Reviewed  Physical Exam   GEN:  Ill-appearing, in bed, coughing no acute distress.   Neck:  Minimal JVD Cardiac: RRR,  soft systolic murmur, no rubs, or gallops.  Respiratory: Clear to auscultation bilaterally. GI: Soft, nontender, non-distended  MS: No edema; No deformity. Neuro:  Nonfocal  Psych: Normal affect   Labs    Chemistry Recent Labs  Lab 11/29/18 1854 11/30/18 0331  NA 131* 133*  K 5.6* 5.3*  CL 95* 95*  CO2 25 22  GLUCOSE 118* 363*  BUN 26* 36*  CREATININE 4.12* 4.94*  CALCIUM 9.6 9.8  ALBUMIN  --  2.4*  GFRNONAA 13* 10*  GFRAA 15* 12*  ANIONGAP 11 16*     Hematology Recent Labs  Lab 11/29/18 1854 11/30/18 0331  WBC 6.2 5.7  RBC 3.48* 3.41*  HGB 9.2* 9.0*  HCT 31.4* 31.2*  MCV 90.2 91.5  MCH 26.4 26.4  MCHC 29.3* 28.8*  RDW 17.2* 17.1*  PLT 315 303    Cardiac Enzymes Recent Labs  Lab 11/29/18 1854 11/29/18 2301 11/30/18 0331  TROPONINI 0.24* 0.20* 0.16*   No results for input(s): TROPIPOC in the last 168 hours.   BNP Recent Labs  Lab 11/29/18 1854  BNP 792.7*     DDimer No results for input(s): DDIMER in the last 168 hours.   Radiology    Dg Chest Portable 1 View  Result Date: 11/29/2018 CLINICAL DATA:  Epigastric pain starting yesterday. Fever. Nausea and vomiting. EXAM:  PORTABLE CHEST 1 VIEW COMPARISON:  11/27/2018 FINDINGS: Prominent enlargement of the cardiopericardial silhouette . Aortic valve replacement. Dual lead pacer. Atherosclerotic calcification of the aortic arch. Blunted right costophrenic angle similar to prior. The lungs appear otherwise clear. IMPRESSION: 1. Stable appearance of prominent enlargement of the cardiopericardial silhouette without edema. 2. Blunted right costophrenic angle, possibly from a small right pleural effusion. 3. Aortic valve prosthesis. 4.  Aortic Atherosclerosis (ICD10-I70.0). 5. Dual lead pacer. Electronically Signed   By: Van Clines M.D.   On: 11/29/2018 19:16    Cardiac Studies     Patient Profile     78 y.o. male with low level troponin increase at baseline secondary to renal disease with CAD  multiple PCI's as recently as 2019.  Currently with upper respiratory infection, pneumonia.  Assessment & Plan    Elevated troponin - Has decreased, this is at his baseline level of low level troponin given his kidney disease.  No further cardiac intervention.  This does increase his overall cardiovascular morbidity and mortality however.  Chest pain is related to upper respiratory infection, cough.  Coronary artery disease - Stable.  Troponins are not indicative of acute coronary syndrome.  07/2018 Left heart cath  1. Severe 2 vessel obstructive CAD - 95% in stent restenosis of the ostial LCx - 100% CTO of the RCA 2. Stent in the proximal to mid LAD is patent 3. Successful scoring balloon/PTCA of the ostial LCx  Pacemaker -Functioning normally.  Atrial fibrillation permanent - No changes.  Eliquis.   Ischemic cardiomyopathy - Mildly reduced EF 45 to 50%.  Be careful with excessive fluid intake.  Aortic stenosis status post TAVR - Mild to moderate perivalvular regurgitation on 2019 echo.  Stable.  Overall, continue to treat underlying pulmonary illness.  [facility] will sign off.   Medication Recommendations: No new recommendations Other recommendations (labs, testing, etc):  none Follow up as an outpatient: As previously scheduled  For questions or updates, please contact [place] Please consult [email or web address] for contact info under        Signed, Candee Furbish, MD  11/30/2018, 12:14 PM

## 2018-11-30 NOTE — ED Notes (Signed)
BFAST TRAY ORDERED 

## 2018-11-30 NOTE — ED Notes (Signed)
Pt given breakfast tray

## 2018-11-30 NOTE — ED Notes (Signed)
Pt called out reporting new onset of central CP. Notes that it is different than recorded L rib cage pain assessed up to this point. New EKG obtained and given to R. Tamala Julian, MD.

## 2018-11-30 NOTE — ED Notes (Signed)
Pt moved to hospital bed for pt's comfort. Slight decr in central CP after application of nitro paste, but not as effective as SL nitro.

## 2018-11-30 NOTE — ED Notes (Signed)
Tray ordered for pt.

## 2018-11-30 NOTE — Progress Notes (Signed)
[facility]                                                                              Patient Demographics  Jeffrey [name], is a 78 y.o. male, DOB - 1940-12-20, KGY:[id number]  Admit date - 11/29/2018   Admitting Physician Norval Morton, MD  Outpatient Primary MD for the patient is [name], Dianna Rossetti, MD  Outpatient specialists:   LOS - 1  days    Chief Complaint  Patient presents with  . Fever  . Cough       Brief summary  Jeffrey [name] is a 78 y.o. male with medical history significant for but not limited to CAD, combined systolic and diastolic CHF, severe AS s/p TAVR, A. 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 PNA  Assessment & Plan    Active Problems:   Type 2 diabetes mellitus with chronic kidney disease on chronic dialysis, with long-term current use of insulin (HCC)   CAD (coronary artery disease)   Essential hypertension   ESRD (end stage renal disease) on dialysis [facility])   Atrial fibrillation (HCC) [I48.91]   Chest pain   Chronic respiratory failure with hypoxia (HCC)   COPD exacerbation (HCC)   RSV (respiratory syncytial virus infection)   Acute COPD with URI/Poss ??PNA  - Admit to a cardiac telemetry bed - Follow-up respiratory virus panel  - Follow-up blood and sputum cultures - Add on procalcitonin - Continuous pulse oximetry with nasal cannula oxygen - DuoNebs 4 times daily and as needed for shortness of breath  - Continue empiric antibiotics Rocephin - Brovana and budesonide nebs  Elevated Troponin Patient noted intermittent chest pain on admit Initial troponin noted to be 0.24.  Suspect demand in setting of ESRD.   Cardiology was consulted - Dr Marlou Porch Trend troponin  ESRD on HD On Monday Wednesday Friday dialysis upon re-clarification today. Consulted nephrology for routine HD  tomorrow  Code Status: Full DVT Prophylaxis:  Lovenox  Family Communication: Discussed in detail with the patient, all imaging results, lab results explained to the patient    Disposition Plan: Home  Time Spent in minutes Home minutes  Procedures:    Consultants:   Nephrology, Dr Carolin Sicks  Antimicrobials:    Medications  Scheduled Meds: . apixaban  5 mg Oral BID  . arformoterol  15 mcg Nebulization BID  . atorvastatin  80 mg Oral Daily  . budesonide (PULMICORT) nebulizer solution  0.5 mg Nebulization BID  . cinacalcet  30 mg Oral Q supper  . clopidogrel  75 mg Oral Daily  . guaiFENesin  600 mg Oral BID  . insulin aspart  0-9 Units Subcutaneous TID WC  . insulin detemir  50 Units Subcutaneous QHS  . ipratropium-albuterol  3 mL Nebulization QID  . methylPREDNISolone (SOLU-MEDROL) injection  60 mg Intravenous BID  . metoprolol tartrate  12.5 mg Oral BID  . pantoprazole  40 mg Oral Daily  . sevelamer carbonate  4,000 mg Oral TID WC   Continuous Infusions:  PRN Meds:.acetaminophen **OR** acetaminophen, fentaNYL (SUBLIMAZE) injection, ipratropium-albuterol, lidocaine-prilocaine, nitroGLYCERIN, ondansetron **OR** ondansetron (ZOFRAN) IV, sevelamer carbonate   Antibiotics   Anti-infectives (From admission, onward)   Start     Dose/Rate Route Frequency Ordered Stop   11/29/18 2015  cefTRIAXone (ROCEPHIN) 1 g in sodium chloride 0.9 % 100 mL IVPB     1 g 200 mL/hr over 30 Minutes Intravenous  Once 11/29/18 2003 11/29/18 2140        Subjective:   Jeffrey [name] was seen and examined today. Patient denies dizziness, chest pain. Saids shortness of breath is better. No acute events overnight.    Objective:   Vitals:   11/30/18 0730 11/30/18 0800 11/30/18 0830 11/30/18 0900  BP: (!) 129/58 (!) 120/56 (!) 144/58 (!) 138/57  Pulse: (!) 58 (!) 56 (!) 56 66  Resp: 16 14 18 19   Temp:      TempSrc:      SpO2: 99% 100% 99% 97%  Weight:      Height:        Intake/Output  Summary (Last 24 hours) at 11/30/2018 1142 Last data filed at 11/29/2018 2140 Gross per 24 hour  Intake 100 ml  Output -  Net 100 ml     Wt Readings from Last 3 Encounters:  11/29/18 107 kg  11/27/18 95.3 kg  11/21/18 107 kg     Exam  General: NAD  HEENT: NCAT,  PERRL,MMM  Neck: SUPPLE, (-) JVD  Cardiovascular: RRR, (-) GALLOP, (-) MURMUR  Respiratory:b/l rhonchi/wheezing  Gastrointestinal: SOFT, (-) DISTENSION, BS(+), (_) TENDERNESS  Ext: (-) CYANOSIS, (-) EDEMA  Neuro: A, OX 3  Skin:(-) RASH  Psych:NORMAL AFFECT/MOOD   Data Reviewed:  I have personally reviewed following labs and imaging studies  Micro Results Recent Results (from the past 240 hour(s))  Respiratory Panel by PCR     Status: Abnormal   Collection Time: 11/29/18  6:54 PM  Result Value Ref Range Status   Adenovirus NOT DETECTED NOT DETECTED Final   Coronavirus 229E NOT DETECTED NOT DETECTED Final   Coronavirus HKU1 NOT DETECTED NOT DETECTED Final   Coronavirus NL63 NOT DETECTED NOT DETECTED Final   Coronavirus OC43 NOT DETECTED NOT DETECTED Final   Metapneumovirus NOT DETECTED NOT DETECTED Final   Rhinovirus / Enterovirus NOT DETECTED NOT DETECTED Final   Influenza A NOT DETECTED NOT DETECTED Final   Influenza B NOT DETECTED NOT DETECTED Final   Parainfluenza Virus 1 NOT DETECTED NOT DETECTED Final   Parainfluenza Virus 2 NOT DETECTED NOT DETECTED Final   Parainfluenza Virus 3 NOT DETECTED NOT DETECTED Final   Parainfluenza Virus 4 NOT DETECTED NOT DETECTED Final   Respiratory Syncytial Virus DETECTED (A) NOT DETECTED Final    Comment: CRITICAL RESULT CALLED TO, READ BACK BY AND VERIFIED WITH: M. TOBIAS,RN 2326 11/29/2018 T. TYSOR    Bordetella pertussis NOT DETECTED NOT DETECTED Final   Chlamydophila pneumoniae NOT DETECTED NOT DETECTED Final   Mycoplasma pneumoniae NOT DETECTED NOT DETECTED Final  Culture, blood (routine x 2) Call MD if unable to obtain prior to antibiotics being given      Status: None (Preliminary result)   Collection Time: 11/30/18 12:37 AM  Result Value Ref Range Status   Specimen Description BLOOD RIGHT WRIST  Final   Special Requests   Final    BOTTLES DRAWN AEROBIC AND ANAEROBIC Blood Culture results may not be optimal due to an inadequate volume of blood received in culture bottles   Culture NO GROWTH < 12  HOURS  Final   Report Status PENDING  Incomplete  Culture, blood (routine x 2) Call MD if unable to obtain prior to antibiotics being given     Status: None (Preliminary result)   Collection Time: 11/30/18 12:40 AM  Result Value Ref Range Status   Specimen Description BLOOD RIGHT HAND  Final   Special Requests   Final    BOTTLES DRAWN AEROBIC AND ANAEROBIC Blood Culture results may not be optimal due to an inadequate volume of blood received in culture bottles   Culture NO GROWTH < 12 HOURS  Final   Report Status PENDING  Incomplete    Radiology Reports Dg Chest 2 View  Result Date: 11/27/2018 CLINICAL DATA:  Cough with dyspnea, wheezing and congestion. EXAM: CHEST - 2 VIEW COMPARISON:  09/18/2018. FINDINGS: Cardiomegaly. Calcified tortuous aorta. Dual lead pacer RIGHT subclavian approach. TAVR. Moderate vascular congestion. Low lung volumes. Small BILATERAL effusions. IMPRESSION: Cardiomegaly. Moderate vascular congestion. Low lung volumes. No active infiltrates or failure. Worsening aeration from priors. Electronically Signed   By: Staci Righter M.D.   On: 11/27/2018 16:07   Ct Head Wo Contrast  Result Date: 11/05/2018 CLINICAL DATA:  78 year old male with head trauma. EXAM: CT HEAD WITHOUT CONTRAST TECHNIQUE: Contiguous axial images were obtained from the base of the skull through the vertex without intravenous contrast. COMPARISON:  Head CT dated 09/28/2014 FINDINGS: Brain: There is mild age-related atrophy and chronic microvascular ischemic changes. There is no acute intracranial hemorrhage. No mass effect or midline shift. No extra-axial fluid  collection. Vascular: No hyperdense vessel or unexpected calcification. Skull: Normal. Negative for fracture or focal lesion. Sinuses/Orbits: No acute finding. Other: None IMPRESSION: 1. No acute intracranial hemorrhage. 2. Mild age-related atrophy and chronic microvascular ischemic changes. Electronically Signed   By: Anner Crete M.D.   On: 11/05/2018 05:49   Dg Chest Portable 1 View  Result Date: 11/29/2018 CLINICAL DATA:  Epigastric pain starting yesterday. Fever. Nausea and vomiting. EXAM: PORTABLE CHEST 1 VIEW COMPARISON:  11/27/2018 FINDINGS: Prominent enlargement of the cardiopericardial silhouette . Aortic valve replacement. Dual lead pacer. Atherosclerotic calcification of the aortic arch. Blunted right costophrenic angle similar to prior. The lungs appear otherwise clear. IMPRESSION: 1. Stable appearance of prominent enlargement of the cardiopericardial silhouette without edema. 2. Blunted right costophrenic angle, possibly from a small right pleural effusion. 3. Aortic valve prosthesis. 4.  Aortic Atherosclerosis (ICD10-I70.0). 5. Dual lead pacer. Electronically Signed   By: Van Clines M.D.   On: 11/29/2018 19:16    Lab Data:  CBC: Recent Labs  Lab 11/29/18 1854 11/30/18 0331  WBC 6.2 5.7  NEUTROABS 5.6  --   HGB 9.2* 9.0*  HCT 31.4* 31.2*  MCV 90.2 91.5  PLT 315 009   Basic Metabolic Panel: Recent Labs  Lab 11/29/18 1854 11/30/18 0331  NA 131* 133*  K 5.6* 5.3*  CL 95* 95*  CO2 25 22  GLUCOSE 118* 363*  BUN 26* 36*  CREATININE 4.12* 4.94*  CALCIUM 9.6 9.8  PHOS  --  5.7*   GFR: Estimated Creatinine Clearance: 15.1 mL/min (A) (by C-G formula based on SCr of 4.94 mg/dL (H)). Liver Function Tests: Recent Labs  Lab 11/30/18 0331  ALBUMIN 2.4*   No results for input(s): LIPASE, AMYLASE in the last 168 hours. No results for input(s): AMMONIA in the last 168 hours. Coagulation Profile: No results for input(s): INR, PROTIME in the last 168  hours. Cardiac Enzymes: Recent Labs  Lab 11/29/18 1854 11/29/18 2301 11/30/18  0331  TROPONINI 0.24* 0.20* 0.16*   BNP (last 3 results) No results for input(s): PROBNP in the last 8760 hours. HbA1C: No results for input(s): HGBA1C in the last 72 hours. CBG: Recent Labs  Lab 11/29/18 2206 11/30/18 0810  GLUCAP 156* 331*   Lipid Profile: No results for input(s): CHOL, HDL, LDLCALC, TRIG, CHOLHDL, LDLDIRECT in the last 72 hours. Thyroid Function Tests: No results for input(s): TSH, T4TOTAL, FREET4, T3FREE, THYROIDAB in the last 72 hours. Anemia Panel: No results for input(s): VITAMINB12, FOLATE, FERRITIN, TIBC, IRON, RETICCTPCT in the last 72 hours. Urine analysis:    Component Value Date/Time   COLORURINE YELLOW 06/17/2017 [place] 06/17/2017 0547   LABSPEC 1.010 06/17/2017 0547   PHURINE 6.0 06/17/2017 0547   GLUCOSEU >=500 (A) 06/17/2017 0547   HGBUR SMALL (A) 06/17/2017 0547   BILIRUBINUR NEGATIVE 06/17/2017 [place] 06/17/2017 0547   PROTEINUR 100 (A) 06/17/2017 0547   UROBILINOGEN 0.2 09/28/2014 2257   NITRITE NEGATIVE 06/17/2017 0547   LEUKOCYTESUR TRACE (A) 06/17/2017 0547     Benito Mccreedy M.D. [facility] 11/30/2018, 11:42 AM  Pager: [phone] Between 7am to 7pm - call Pager - [phone]  After 7pm go to [email or web address] - password [email or web address]  Call night coverage person covering after 7pm

## 2018-11-30 NOTE — Progress Notes (Signed)
PCR swab collected per protocol, lab called and needs another sputum sample will pass that to night shift RN

## 2018-11-30 NOTE — ED Notes (Signed)
Pt remains unable to urinate. States he produces very little urine d/t dialysis. Urinal at bedside.

## 2018-12-01 LAB — RENAL FUNCTION PANEL
ANION GAP: 15 (ref 5–15)
Albumin: 2.5 g/dL — ABNORMAL LOW (ref 3.5–5.0)
BUN: 64 mg/dL — ABNORMAL HIGH (ref 8–23)
CO2: 22 mmol/L (ref 22–32)
Calcium: 9.2 mg/dL (ref 8.9–10.3)
Chloride: 91 mmol/L — ABNORMAL LOW (ref 98–111)
Creatinine, Ser: 5.93 mg/dL — ABNORMAL HIGH (ref 0.61–1.24)
GFR calc Af Amer: 10 mL/min — ABNORMAL LOW (ref >60)
GFR calc non Af Amer: 8 mL/min — ABNORMAL LOW (ref >60)
GLUCOSE: 169 mg/dL — AB (ref 70–99)
Phosphorus: 5.5 mg/dL — ABNORMAL HIGH (ref 2.5–4.6)
Potassium: 5.7 mmol/L — ABNORMAL HIGH (ref 3.5–5.1)
Sodium: 128 mmol/L — ABNORMAL LOW (ref 135–145)

## 2018-12-01 LAB — GLUCOSE, CAPILLARY
GLUCOSE-CAPILLARY: 110 mg/dL — AB (ref 70–99)
Glucose-Capillary: 164 mg/dL — ABNORMAL HIGH (ref 70–99)
Glucose-Capillary: 162 mg/dL — ABNORMAL HIGH (ref 70–99)
Glucose-Capillary: 229 mg/dL — ABNORMAL HIGH (ref 70–99)

## 2018-12-01 LAB — CBC
HCT: 28.8 % — ABNORMAL LOW (ref 39.0–52.0)
Hemoglobin: 8.9 g/dL — ABNORMAL LOW (ref 13.0–17.0)
MCH: 27.6 pg (ref 26.0–34.0)
MCHC: 30.9 g/dL (ref 30.0–36.0)
MCV: 89.2 fL (ref 80.0–100.0)
Platelets: 358 10*3/uL (ref 150–400)
RBC: 3.23 MIL/uL — ABNORMAL LOW (ref 4.22–5.81)
RDW: 17.1 % — ABNORMAL HIGH (ref 11.5–15.5)
WBC: 11.7 10*3/uL — ABNORMAL HIGH (ref 4.0–10.5)
nRBC: 0 % (ref 0.0–0.2)

## 2018-12-01 MED ORDER — SODIUM CHLORIDE 0.9 % IV SOLN: 100.0000 mL | INTRAVENOUS | Status: DC | PRN

## 2018-12-01 MED ORDER — AZITHROMYCIN 500 MG PO TABS
500.0000 mg | ORAL_TABLET | Freq: Every day | ORAL | Status: AC
  Administered 2018-12-01 – 2018-12-05 (×5): 500 mg via ORAL
  Filled 2018-12-01 (×5): qty 1

## 2018-12-01 MED ORDER — CHLORHEXIDINE GLUCONATE CLOTH 2 % EX PADS: 6.0000 | MEDICATED_PAD | Freq: Every day | CUTANEOUS | Status: DC

## 2018-12-01 MED ORDER — INSULIN ASPART 100 UNIT/ML ~~LOC~~ SOLN
0.0000 [IU] | Freq: Three times a day (TID) | SUBCUTANEOUS | Status: DC
  Administered 2018-12-01: 2 [IU] via SUBCUTANEOUS
  Administered 2018-12-02: 3 [IU] via SUBCUTANEOUS
  Administered 2018-12-02: 1 [IU] via SUBCUTANEOUS
  Administered 2018-12-02 – 2018-12-03 (×2): 3 [IU] via SUBCUTANEOUS
  Administered 2018-12-03: 2 [IU] via SUBCUTANEOUS

## 2018-12-01 MED ORDER — METHYLPREDNISOLONE SODIUM SUCC 125 MG IJ SOLR
60.0000 mg | Freq: Three times a day (TID) | INTRAMUSCULAR | Status: DC
  Administered 2018-12-01 – 2018-12-02 (×3): 60 mg via INTRAVENOUS
  Filled 2018-12-01 (×3): qty 2

## 2018-12-01 MED ORDER — CHLORHEXIDINE GLUCONATE CLOTH 2 % EX PADS
6.0000 | MEDICATED_PAD | Freq: Every day | CUTANEOUS | Status: DC
  Administered 2018-12-01: 6 via TOPICAL

## 2018-12-01 MED ORDER — PRO-STAT SUGAR FREE PO LIQD
30.0000 mL | Freq: Two times a day (BID) | ORAL | Status: DC
  Administered 2018-12-01 – 2018-12-02 (×3): 30 mL via ORAL
  Filled 2018-12-01 (×7): qty 30

## 2018-12-01 MED ORDER — INSULIN ASPART 100 UNIT/ML ~~LOC~~ SOLN
0.0000 [IU] | Freq: Every day | SUBCUTANEOUS | Status: DC
  Administered 2018-12-01: 2 [IU] via SUBCUTANEOUS
  Administered 2018-12-02 – 2018-12-05 (×2): 4 [IU] via SUBCUTANEOUS

## 2018-12-01 MED ORDER — DOXERCALCIFEROL 4 MCG/2ML IV SOLN
2.0000 ug | INTRAVENOUS | Status: DC
  Administered 2018-12-03 – 2018-12-05 (×2): 2 ug via INTRAVENOUS
  Filled 2018-12-01 (×2): qty 2

## 2018-12-01 MED ORDER — MELATONIN 3 MG PO TABS
6.0000 mg | ORAL_TABLET | Freq: Every evening | ORAL | Status: DC | PRN
  Administered 2018-12-01 – 2018-12-05 (×5): 6 mg via ORAL
  Filled 2018-12-01 (×7): qty 2

## 2018-12-01 NOTE — Progress Notes (Signed)
Pt currently admitted.

## 2018-12-01 NOTE — Progress Notes (Addendum)
Patient ID: Jeffrey [name], male   DOB: 1941-11-11, 78 y.o.   MRN: [id number]  PROGRESS NOTE    Jeffrey [name]  FGH:[id number] DOB: 05-30-1941 DOA: 11/29/2018 PCP: Thurman Coyer, MD   Brief Narrative:  78 year old male with history of CAD, combined systolic and diastolic CHF, severe left ear status post TAVR, A. 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   Assessment & Plan:   Active Problems:   Type 2 diabetes mellitus with chronic kidney disease on chronic dialysis, with long-term current use of insulin (HCC)   CAD (coronary artery disease)   Essential hypertension   ESRD (end stage renal disease) on dialysis [facility])   Atrial fibrillation (HCC) [I48.91]   Chest pain   Chronic respiratory failure with hypoxia (HCC)   COPD exacerbation (HCC)   RSV (respiratory syncytial virus infection)   Troponin level elevated   COPD exacerbation with probable viral bronchitis in a patient with chronic hypoxic respiratory failure/doubt that patient has bacterial pneumonia -Patient is still wheezing.  Currently on 3 L oxygen via nasal cannula.  Increase Solu-Medrol to 60 mg IV every 8 hours.  Continue duo nebs and Pulmicort.  Add Zithromax.  Discontinue Rocephin since it is unlikely that patient has bacterial pneumonia -[facility]pite virus panel is positive for RSV  Elevated troponin -Troponins did not trend up.  Cardiology evaluation appreciated.  Cardiology has signed off.  No chest pain.  No further work-up indicated  Coronary artery disease -Stable.  Continue metoprolol, Plavix and statin  Permanent atrial fibrillation -Mildly bradycardic.  Continue Eliquis.  Continue metoprolol if blood pressure and heart rate allows  Chronic combined systolic and diastolic heart failure/status post pacemaker -Volume  managed by dialysis.  Strict input and output.  Daily weights  History of aortic stenosis status post TAVR -Stable.  Outpatient follow-up with cardiology  End-stage renal disease on hemodialysis -Consulted nephrology.  Anemia of chronic disease -From end-stage renal disease.  Hemoglobin stable.  Monitor  Mild hyperkalemia -From above.  Monitor.  Diabetes mellitus type 2 with hyperglycemia -Continue Levemir.  Continue CBGs with SSI.   DVT prophylaxis: Eliquis Code Status:   Full Family Communication: None at bedside Disposition Plan: Home pending clinical improvement  Consultants: Called nephrology for consult  Procedures: None  Antimicrobials: Rocephin on 11/29/2018   Subjective: Patient seen and examined at bedside.  He feels very weak and is extremely short of breath with even minimal exertion.  No overnight fever, nausea or vomiting.  Objective: Vitals:   12/01/18 0110 12/01/18 0427 12/01/18 0816 12/01/18 0847  BP: 111/90 (!) 99/51  (!) 98/54  Pulse: (!) 55 (!) 54    Resp: 18 20    Temp: 98 F (36.7 C) 97.8 F (36.6 C)    TempSrc: Oral Oral    SpO2: 100% 98% 96%   Weight:  110 kg    Height:        Intake/Output Summary (Last 24 hours) at 12/01/2018 1009 Last data filed at 12/01/2018 0842 Gross per 24 hour  Intake 440 ml  Output 0 ml  Net 440 ml   Filed Weights   11/29/18 1823 12/01/18 0427  Weight: 107 kg 110 kg    Examination:  General exam: Appears slightly tachypneic.  Elderly male lying on bed. Respiratory system: Bilateral decreased breath sounds at bases with  scattered wheezing and crackles Cardiovascular system: S1 & S2 heard; mild bradycardia Gastrointestinal system: Abdomen is nondistended, soft and nontender. Normal bowel sounds heard. Extremities: No cyanosis, clubbing; trace edema  Data Reviewed: I have personally reviewed following labs and imaging studies  CBC: Recent Labs  Lab 11/29/18 1854 11/30/18 0331  WBC 6.2 5.7  NEUTROABS  5.6  --   HGB 9.2* 9.0*  HCT 31.4* 31.2*  MCV 90.2 91.5  PLT 315 417   Basic Metabolic Panel: Recent Labs  Lab 11/29/18 1854 11/30/18 0331  NA 131* 133*  K 5.6* 5.3*  CL 95* 95*  CO2 25 22  GLUCOSE 118* 363*  BUN 26* 36*  CREATININE 4.12* 4.94*  CALCIUM 9.6 9.8  PHOS  --  5.7*   GFR: Estimated Creatinine Clearance: 15.3 mL/min (A) (by C-G formula based on SCr of 4.94 mg/dL (H)). Liver Function Tests: Recent Labs  Lab 11/30/18 0331  ALBUMIN 2.4*   No results for input(s): LIPASE, AMYLASE in the last 168 hours. No results for input(s): AMMONIA in the last 168 hours. Coagulation Profile: No results for input(s): INR, PROTIME in the last 168 hours. Cardiac Enzymes: Recent Labs  Lab 11/29/18 1854 11/29/18 2301 11/30/18 0331 11/30/18 1118  TROPONINI 0.24* 0.20* 0.16* 1.15*   BNP (last 3 results) No results for input(s): PROBNP in the last 8760 hours. HbA1C: No results for input(s): HGBA1C in the last 72 hours. CBG: Recent Labs  Lab 11/30/18 0810 11/30/18 1343 11/30/18 1656 11/30/18 2057 12/01/18 0731  GLUCAP 331* 378* 289* 243* 164*   Lipid Profile: No results for input(s): CHOL, HDL, LDLCALC, TRIG, CHOLHDL, LDLDIRECT in the last 72 hours. Thyroid Function Tests: No results for input(s): TSH, T4TOTAL, FREET4, T3FREE, THYROIDAB in the last 72 hours. Anemia Panel: No results for input(s): VITAMINB12, FOLATE, FERRITIN, TIBC, IRON, RETICCTPCT in the last 72 hours. Sepsis Labs: Recent Labs  Lab 11/29/18 1908 11/30/18 0037  PROCALCITON  --  0.54  LATICACIDVEN 1.05  --     Recent Results (from the past 240 hour(s))  Respiratory Panel by PCR     Status: Abnormal   Collection Time: 11/29/18  6:54 PM  Result Value Ref Range Status   Adenovirus NOT DETECTED NOT DETECTED Final   Coronavirus 229E NOT DETECTED NOT DETECTED Final   Coronavirus HKU1 NOT DETECTED NOT DETECTED Final   Coronavirus NL63 NOT DETECTED NOT DETECTED Final   Coronavirus OC43 NOT  DETECTED NOT DETECTED Final   Metapneumovirus NOT DETECTED NOT DETECTED Final   Rhinovirus / Enterovirus NOT DETECTED NOT DETECTED Final   Influenza A NOT DETECTED NOT DETECTED Final   Influenza B NOT DETECTED NOT DETECTED Final   Parainfluenza Virus 1 NOT DETECTED NOT DETECTED Final   Parainfluenza Virus 2 NOT DETECTED NOT DETECTED Final   Parainfluenza Virus 3 NOT DETECTED NOT DETECTED Final   Parainfluenza Virus 4 NOT DETECTED NOT DETECTED Final   Respiratory Syncytial Virus DETECTED (A) NOT DETECTED Final    Comment: CRITICAL RESULT CALLED TO, READ BACK BY AND VERIFIED WITH: M. TOBIAS,RN 2326 11/29/2018 T. TYSOR    Bordetella pertussis NOT DETECTED NOT DETECTED Final   Chlamydophila pneumoniae NOT DETECTED NOT DETECTED Final   Mycoplasma pneumoniae NOT DETECTED NOT DETECTED Final  Culture, blood (routine x 2) Call MD if unable to obtain prior to antibiotics being given     Status: None (Preliminary result)   Collection Time: 11/30/18 12:37 AM  Result Value Ref Range Status   Specimen Description BLOOD RIGHT WRIST  Final   Special Requests   Final    BOTTLES DRAWN AEROBIC AND ANAEROBIC Blood Culture results may not be optimal due to an inadequate volume of blood received in culture bottles   Culture NO GROWTH < 24 HOURS  Final   Report Status PENDING  Incomplete  Culture, blood (routine x 2) Call MD if unable to obtain prior to antibiotics being given     Status: None (Preliminary result)   Collection Time: 11/30/18 12:40 AM  Result Value Ref Range Status   Specimen Description BLOOD RIGHT HAND  Final   Special Requests   Final    BOTTLES DRAWN AEROBIC AND ANAEROBIC Blood Culture results may not be optimal due to an inadequate volume of blood received in culture bottles   Culture NO GROWTH < 24 HOURS  Final   Report Status PENDING  Incomplete  Culture, sputum-assessment     Status: None   Collection Time: 11/30/18 11:18 AM  Result Value Ref Range Status   Specimen Description  EXPECTORATED SPUTUM  Final   Special Requests NONE  Final   Sputum evaluation   Final    Sputum specimen not acceptable for testing.  Please recollect.   CRITICAL RESULT CALLED TO, READ BACK BY AND VERIFIED WITH: IDI RN AT [id number] ON [id number] BY SJW Performed at [facility], [place] [address] [place], [place] [address]    Report Status 11/30/2018 FINAL  Final  MRSA PCR Screening     Status: None   Collection Time: 11/30/18  3:40 PM  Result Value Ref Range Status   MRSA by PCR NEGATIVE NEGATIVE Final    Comment:        The GeneXpert MRSA Assay (FDA approved for NASAL specimens only), is one component of a comprehensive MRSA colonization surveillance program. It is not intended to diagnose MRSA infection nor to guide or monitor treatment for MRSA infections. Performed at [facility], [place] [address] [place], [place] [address]          Radiology Studies: Dg Chest Portable 1 View  Result Date: 11/29/2018 CLINICAL DATA:  Epigastric pain starting yesterday. Fever. Nausea and vomiting. EXAM: PORTABLE CHEST 1 VIEW COMPARISON:  11/27/2018 FINDINGS: Prominent enlargement of the cardiopericardial silhouette . Aortic valve replacement. Dual lead pacer. Atherosclerotic calcification of the aortic arch. Blunted right costophrenic angle similar to prior. The lungs appear otherwise clear. IMPRESSION: 1. Stable appearance of prominent enlargement of the cardiopericardial silhouette without edema. 2. Blunted right costophrenic angle, possibly from a small right pleural effusion. 3. Aortic valve prosthesis. 4.  Aortic Atherosclerosis (ICD10-I70.0). 5. Dual lead pacer. Electronically Signed   By: Van Clines M.D.   On: 11/29/2018 19:16        Scheduled Meds: . apixaban  5 mg Oral BID  . arformoterol  15 mcg Nebulization BID  . atorvastatin  80 mg Oral Daily  . budesonide (PULMICORT) nebulizer solution  0.5 mg Nebulization BID  . cinacalcet  30 mg Oral Q supper  . clopidogrel   75 mg Oral Daily  . guaiFENesin  600 mg Oral BID  . insulin aspart  0-9 Units Subcutaneous TID WC  . insulin detemir  50 Units Subcutaneous QHS  . ipratropium-albuterol  3 mL Nebulization QID  . methylPREDNISolone (SOLU-MEDROL) injection  60 mg Intravenous BID  . metoprolol tartrate  12.5 mg Oral BID  . pantoprazole  40 mg Oral Daily  . sevelamer carbonate  4,000 mg Oral TID WC   Continuous Infusions:  LOS: 2 days        Aline August, MD [facility] Pager [phone]  If 7PM-7AM, please contact night-coverage [email or web address] Password [email or web address] 12/01/2018, 10:09 AM

## 2018-12-01 NOTE — Consult Note (Addendum)
[facility] Renal Consultation Note    Indication for Consultation:  Management of ESRD/hemodialysis; anemia, hypertension/volume and secondary hyperparathyroidism  [id number], Dianna Rossetti, MD  HPI: Jeffrey [name] is a 78 y.o. male. 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  Past medical history significant for DM, HLD, HTN, combined systolic and diastolic HF, severe AS s/p TAVR, A fib not on anticoagulation, bradycardia w/ Hx Mobitz 1 - no pacemaker placement, CAD s/p cath, PVD, COPD on home O2 and dysphasia.   Patient seen and examined at bedside.  Presented to the ED due to SOB and CP.  Reports worsening weakness, productive cough w/green sputum, CP, SOB, edema and orthopnea.  Last HD completed as OP on Saturday where 2L were removed and he left 0.5kg under his estimated dry weight.  Believes he may have lost weight recently as his appetite has been decreased over the last week.    Pertinent findings since admission include troponin 1.15, CXR showing pulmonary edema and physical exam findings indicating increased volume.   Patient has been admitted for further evaluation and management.    Past Medical History:  Diagnosis Date  . Anginal pain ([place])   . Aortic stenosis    a. severe by echo 09/2014  . Atrial fibrillation ([place])    a. not well documented, not on anticoagulation  . CHF (congestive heart failure) ([place])    04/28/17 echo-EF 40%, mod LVH, diastolic dysfunction  . Claustrophobia   . Complete heart block ([place])   . COPD (chronic obstructive pulmonary disease) ([place])   . Coronary artery disease    a. chronically occluded RCA per cath 09/2014 with collaterals B. cath 05/01/17 chr occ RCA w/collaterals, 60-70% mid LAD,   . CVA (cerebral vascular accident) ([place]) 10/2014   denies residual on 07/11/2015  . ESRD (end stage renal disease) on dialysis [facility])    a. on dialysis; [facility]; MWF, LUE fistula (07/11/2015)  . History of blood transfusion    "related to gallbladder OR"  .  History of stomach ulcers   . Hyperlipidemia   . Hypertension   . Iron deficiency anemia   . Myocardial infarction ([place]) 10/2014  . Peripheral vascular disease ([place])   . Pneumonia   . Presence of permanent cardiac pacemaker   . 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 Type II diabetes mellitus ([place])    Past Surgical History:  Procedure Laterality Date  . AV FISTULA PLACEMENT Left 10/19/2014   Procedure: BRACHIOCEPHALIC ARTERIOVENOUS (AV) FISTULA CREATION ;  Surgeon: Conrad Pinos Altos, MD;  Location: [place];  Service: Vascular;  Laterality: Left;  . CARDIAC CATHETERIZATION    . CARDIAC CATHETERIZATION N/A 07/22/2015   Procedure: Right/Left Heart Cath and Coronary Angiography;  Surgeon: [name], MD;  Location: [place] CV LAB;  Service: Cardiovascular;  Laterality: N/A;  . CATARACT EXTRACTION W/ INTRAOCULAR LENS  IMPLANT, BILATERAL Bilateral 1990's  . CHOLECYSTECTOMY OPEN  1980's  . COLONOSCOPY W/ BIOPSIES AND POLYPECTOMY    . CORONARY ANGIOGRAPHY N/A 07/31/2018   Procedure: CORONARY ANGIOGRAPHY;  Surgeon: Martinique, Peter M, MD;  Location: [place] CV LAB;  Service: Cardiovascular;  Laterality: N/A;  . CORONARY ANGIOPLASTY    . CORONARY ATHERECTOMY N/A 04/18/2018   Procedure: CORONARY ATHERECTOMY;  Surgeon: [name], MD;  Location: [place] CV LAB;  Service: Cardiovascular;  Laterality: N/A;  . CORONARY  BALLOON ANGIOPLASTY N/A 07/31/2018   Procedure: CORONARY BALLOON ANGIOPLASTY;  Surgeon: Martinique, Peter M, MD;  Location: [place] CV LAB;  Service: Cardiovascular;  Laterality: N/A;  . CORONARY STENT INTERVENTION N/A 04/18/2018   Procedure: CORONARY STENT INTERVENTION;  Surgeon: [name], MD;  Location: [place] CV LAB;  Service: Cardiovascular;  Laterality: N/A;  . EP IMPLANTABLE DEVICE N/A 07/11/2015   Procedure: Pacemaker  Implant;  Surgeon: [name], MD;  Location: [place] CV LAB;  Service: Cardiovascular;  Laterality: N/A;  . ESOPHAGOGASTRODUODENOSCOPY  08/01/2012   Procedure: ESOPHAGOGASTRODUODENOSCOPY (EGD);  Surgeon: [name], MD;  Location: [facility];  Service: Endoscopy;  Laterality: N/A;  . INSERT / REPLACE / REMOVE PACEMAKER  07/11/2015  . INSERTION OF DIALYSIS CATHETER Right 02/02/2015   Procedure: INSERTION OF DIALYSIS CATHETER  RIGHT INTERNAL JUGULAR;  Surgeon: Mal [name], MD;  Location: [place];  Service: Vascular;  Laterality: Right;  . LEFT AND RIGHT HEART CATHETERIZATION WITH CORONARY ANGIOGRAM N/A 09/30/2014   Procedure: LEFT AND RIGHT HEART CATHETERIZATION WITH CORONARY ANGIOGRAM;  Surgeon: [name], MD;  Location: [facility] CATH LAB;  Service: Cardiovascular;  Laterality: N/A;  . LEFT HEART CATH AND CORONARY ANGIOGRAPHY N/A 04/14/2018   Procedure: LEFT HEART CATH AND CORONARY ANGIOGRAPHY;  Surgeon: [name], MD;  Location: [place] [facility];  Service: Cardiovascular;  Laterality: N/A;  . TEE WITHOUT CARDIOVERSION N/A 08/02/2015   Procedure: TRANSESOPHAGEAL ECHOCARDIOGRAM (TEE);  Surgeon: [name], MD;  Location: [place];  Service: Open Heart Surgery;  Laterality: N/A;  . TONSILLECTOMY    . TRANSCATHETER AORTIC VALVE REPLACEMENT, TRANSFEMORAL Left 08/02/2015   Procedure: TRANSCATHETER AORTIC VALVE REPLACEMENT, TRANSFEMORAL;  Surgeon: [name], MD;  Location: [place];  Service: Open Heart Surgery;  Laterality: Left;   Family History  Problem Relation Age of Onset  . Diabetes Father   . Heart disease Father   . Diabetes Sister   . Alzheimer's disease Mother   . Stroke Mother   . Diabetes Paternal Grandmother   . Alzheimer's disease Maternal Aunt        x 8 Maternal Aunts  . Cancer Maternal Uncle        type unknown   Social History:  reports that he quit smoking about 35 years ago. He has a 64.00 pack-year smoking history. He has never used smokeless tobacco. He  reports current alcohol use. He reports that he does not use drugs. Allergies  Allergen Reactions  . Byetta 10 Mcg Pen [Exenatide] Diarrhea and Nausea And Vomiting  . Codeine Itching  . Coumadin [Warfarin Sodium] Rash   Prior to Admission medications   Medication Sig Start Date End Date Taking? Authorizing Provider  albuterol (PROVENTIL HFA;VENTOLIN HFA) 108 (90 Base) MCG/ACT inhaler Inhale 2 puffs into the lungs every 4 (four) hours as needed for wheezing or shortness of breath. 11/27/18  Yes Tanda Rockers, MD  apixaban (ELIQUIS) 5 MG TABS tablet Take 1 tablet (5 mg total) by mouth 2 (two) times daily with a meal. 09/02/18  Yes Deboraha Sprang, MD  atorvastatin (LIPITOR) 80 MG tablet Take 1 tablet by mouth daily. 10/29/18  Yes [provider]  budesonide-formoterol (SYMBICORT) 160-4.5 MCG/ACT inhaler Inhale 2 puffs into the lungs 2 (two) times daily. 11/27/18  Yes Tanda Rockers, MD  cefdinir (OMNICEF) 300 MG capsule Take 1 capsule (300 mg total) by mouth 2 (two) times daily. 11/27/18  Yes Tanda Rockers, MD  cinacalcet (SENSIPAR) 30 MG  tablet Take 30 mg by mouth daily with supper.   Yes [provider]  clopidogrel (PLAVIX) 75 MG tablet Take 1 tablet (75 mg total) by mouth daily. 09/02/18  Yes Martinique, Peter M, MD  insulin detemir (LEVEMIR) 100 UNIT/ML injection Inject 0.5 mLs (50 Units total) into the skin at bedtime. 04/19/18  Yes Rai, Ripudeep K, MD  lidocaine-prilocaine (EMLA) cream Apply 1 application topically once as needed (prior to accessing port).    Yes [provider]  metoprolol tartrate (LOPRESSOR) 25 MG tablet One half twice daily Patient taking differently: Take 12.5 mg by mouth 2 (two) times daily.  11/27/18  Yes Tanda Rockers, MD  OXYGEN Inhale into the lungs. 2-3 lpm 24/7   Yes [provider]  pantoprazole (PROTONIX) 40 MG tablet Take 1 tablet (40 mg total) by mouth daily. 08/29/18  Yes Deboraha Sprang, MD  predniSONE (DELTASONE) 10 MG tablet  Take  4 each am x 2 days,   2 each am x 2 days,  1 each am x 2 days and stop 11/27/18  Yes Tanda Rockers, MD  sevelamer carbonate (RENVELA) 800 MG tablet Take 1,600-4,000 mg by mouth See admin instructions. Take 5 tablets by mouth 3 times daily with meals and then 2 tablets twice daily with snacks    Yes [provider]  Insulin Pen Needle 31G X 6 MM MISC USE 1  THREE TIMES DAILY 08/28/18   [provider]  loratadine (CLARITIN) 10 MG tablet Take 1 tablet (10 mg total) by mouth daily. Patient not taking: Reported on 11/29/2018 09/18/18   Fenton Foy, NP  nitroGLYCERIN (NITROSTAT) 0.4 MG SL tablet Place 1 tablet (0.4 mg total) under the tongue every 5 (five) minutes as needed for chest pain. Patient not taking: Reported on 11/27/2018 04/19/18   Rai, Vernelle Emerald, MD  Triamcinolone Acetonide (TRIAMCINOLONE 0.1 % CREAM : EUCERIN) CREA Apply 1 application topically 3 (three) times daily as needed. Apply to arms, back, and other areas with active rash/itching Patient not taking: Reported on 11/29/2018 04/19/18   Mendel Corning, MD   Current Facility-Administered Medications  Medication Dose Route Frequency Provider Last Rate Last Dose  . 0.9 %  sodium chloride infusion  100 mL Intravenous PRN Penninger, Ria Comment, PA      . 0.9 %  sodium chloride infusion  100 mL Intravenous PRN Penninger, Ria Comment, PA      . acetaminophen (TYLENOL) tablet 650 mg  650 mg Oral Q6H PRN Fuller Plan A, MD   650 mg at 11/29/18 2352   Or  . acetaminophen (TYLENOL) suppository 650 mg  650 mg Rectal Q6H PRN Fuller Plan A, MD      . apixaban (ELIQUIS) tablet 5 mg  5 mg Oral BID Fuller Plan A, MD   5 mg at 12/01/18 0848  . arformoterol (BROVANA) nebulizer solution 15 mcg  15 mcg Nebulization BID Fuller Plan A, MD   15 mcg at 12/01/18 0816  . atorvastatin (LIPITOR) tablet 80 mg  80 mg Oral Daily Tamala Julian, Rondell A, MD   80 mg at 12/01/18 0945  . azithromycin (ZITHROMAX) tablet 500 mg  500 mg Oral Daily Aline August, MD   500 mg at 12/01/18 1212  . budesonide (PULMICORT) nebulizer solution 0.5 mg  0.5 mg Nebulization BID Fuller Plan A, MD   0.5 mg at 12/01/18 0816  . Chlorhexidine Gluconate Cloth 2 % PADS 6 each  6 each Topical Q0600 Penninger, [place], [place]  6 each at 12/01/18 1213  . cinacalcet (SENSIPAR) tablet 30 mg  30 mg Oral Q supper Fuller Plan A, MD   30 mg at 11/30/18 1710  . clopidogrel (PLAVIX) tablet 75 mg  75 mg Oral Daily Fuller Plan A, MD   75 mg at 12/01/18 0847  . fentaNYL (SUBLIMAZE) injection 25 mcg  25 mcg Intravenous Q2H PRN Fuller Plan A, MD   25 mcg at 11/29/18 2115  . guaiFENesin (MUCINEX) 12 hr tablet 600 mg  600 mg Oral BID Fuller Plan A, MD   600 mg at 12/01/18 0847  . insulin aspart (novoLOG) injection 0-5 Units  0-5 Units Subcutaneous QHS Alekh, Kshitiz, MD      . insulin aspart (novoLOG) injection 0-9 Units  0-9 Units Subcutaneous TID WC Aline August, MD   2 Units at 12/01/18 1220  . insulin detemir (LEVEMIR) injection 50 Units  50 Units Subcutaneous QHS Norval Morton, MD   50 Units at 11/30/18 2130  . ipratropium-albuterol (DUONEB) 0.5-2.5 (3) MG/3ML nebulizer solution 3 mL  3 mL Nebulization QID Fuller Plan A, MD   3 mL at 12/01/18 0816  . ipratropium-albuterol (DUONEB) 0.5-2.5 (3) MG/3ML nebulizer solution 3 mL  3 mL Nebulization Q4H PRN Smith, Rondell A, MD      . lidocaine-prilocaine (EMLA) cream 1 application  1 application Topical Once PRN Tamala Julian, Rondell A, MD      . methylPREDNISolone sodium succinate (SOLU-MEDROL) 125 mg/2 mL injection 60 mg  60 mg Intravenous Q8H Alekh, Kshitiz, MD      . metoprolol tartrate (LOPRESSOR) tablet 12.5 mg  12.5 mg Oral BID Tamala Julian, Rondell A, MD   12.5 mg at 11/30/18 2130  . nitroGLYCERIN (NITROSTAT) SL tablet 0.4 mg  0.4 mg Sublingual Q5 min PRN Norval Morton, MD   Stopped at 11/30/18 0159  . ondansetron (ZOFRAN) tablet 4 mg  4 mg Oral Q6H PRN Fuller Plan A, MD       Or  . ondansetron (ZOFRAN) injection 4  mg  4 mg Intravenous Q6H PRN Smith, Rondell A, MD      . pantoprazole (PROTONIX) EC tablet 40 mg  40 mg Oral Daily Tamala Julian, Rondell A, MD   40 mg at 12/01/18 0848  . sevelamer carbonate (RENVELA) tablet 1,600 mg  1,600 mg Oral PRN Fuller Plan A, MD      . sevelamer carbonate (RENVELA) tablet 4,000 mg  4,000 mg Oral TID WC Fuller Plan A, MD   4,000 mg at 12/01/18 1212   Labs: Basic Metabolic Panel: Recent Labs  Lab 11/29/18 1854 11/30/18 0331 12/01/18 1316  NA 131* 133* 128*  K 5.6* 5.3* 5.7*  CL 95* 95* 91*  CO2 25 22 22   GLUCOSE 118* 363* 169*  BUN 26* 36* 64*  CREATININE 4.12* 4.94* 5.93*  CALCIUM 9.6 9.8 9.2  PHOS  --  5.7* 5.5*   Liver Function Tests: Recent Labs  Lab 11/30/18 0331 12/01/18 1316  ALBUMIN 2.4* 2.5*   CBC: Recent Labs  Lab 11/29/18 1854 11/30/18 0331 12/01/18 1316  WBC 6.2 5.7 11.7*  NEUTROABS 5.6  --   --   HGB 9.2* 9.0* 8.9*  HCT 31.4* 31.2* 28.8*  MCV 90.2 91.5 89.2  PLT 315 303 358   Cardiac Enzymes: Recent Labs  Lab 11/29/18 1854 11/29/18 2301 11/30/18 0331 11/30/18 1118  TROPONINI 0.24* 0.20* 0.16* 1.15*   CBG: Recent Labs  Lab 11/30/18 1343 11/30/18 1656 11/30/18 2057 12/01/18 0731 12/01/18 1137  GLUCAP 378*  289* 243* 164* 162*   Studies/Results: Dg Chest Portable 1 View  Result Date: 11/29/2018 CLINICAL DATA:  Epigastric pain starting yesterday. Fever. Nausea and vomiting. EXAM: PORTABLE CHEST 1 VIEW COMPARISON:  11/27/2018 FINDINGS: Prominent enlargement of the cardiopericardial silhouette . Aortic valve replacement. Dual lead pacer. Atherosclerotic calcification of the aortic arch. Blunted right costophrenic angle similar to prior. The lungs appear otherwise clear. IMPRESSION: 1. Stable appearance of prominent enlargement of the cardiopericardial silhouette without edema. 2. Blunted right costophrenic angle, possibly from a small right pleural effusion. 3. Aortic valve prosthesis. 4.  Aortic Atherosclerosis (ICD10-I70.0).  5. Dual lead pacer. Electronically Signed   By: Van Clines M.D.   On: 11/29/2018 19:16    ROS: All others negative except those listed in HPI.   Physical Exam: Vitals:   12/01/18 1400 12/01/18 1430 12/01/18 1500 12/01/18 1530  BP: (!) 139/59 (!) 139/51 (!) 110/52 (!) 142/60  Pulse: (!) 56 (!) 59 64 67  Resp:      Temp:      TempSrc:      SpO2:      Weight:      Height:         General: WDWN, NAD, chronically ill appearing, elderly male Head: NCAT sclera not icteric MMM Neck: Supple. No lymphadenopathy Lungs: BS decreased, diffuse crackles and wheezing throughout. No rhonchi. Breathing is unlabored. Heart: RRR. +4/0 systolic murmur, no rubs or gallops.  Abdomen: soft, nontender, +BS, no guarding, no rebound tenderness  Lower extremities:4+ LE edema, no ischemic changes, or open wounds  Neuro: AAOx3. Moves all extremities spontaneously. Psych:  Responds to questions appropriately with a normal affect. Dialysis Access: LU AVF +b/t  Dialysis Orders:  MWF - [facility]  4hrs, BFR 400, DFR 800,  EDW105kg, 2K/ 3.5Ca  Access: LU AVF  Heparin None Mircera 225 mcg q2wks - last 280mcg on 12/24 Hectorol 2 mcg IV qHD  Assessment/Plan: 1.  Dyspnea 2/2 COPD exacerbation/Volume overload - Plans for HD today and possible extra HD tomorrow. Net UF goal 3L today.  COPD meds per primary.   2. CP/elevated troponin - Improved. Cardiology consulted, suspects CP due to COPD & signed off.  3.  ESRD -  On HD MWF.  HD today per regular schedule.  4.  Hypertension  - BP variable.  Expect improvement post HD.   5.  Anemia of CKD - Hgb 8.9. On max esa, will re-dose on Wed if still inpatient.  6.  Secondary Hyperparathyroidism -  Ca and phos at goal. Continue VDRA and binders. 7.  Nutrition - Renal/Carb modified diet. Vitamins. Prostat.  8. A fib 9. Chronic combined CHF 10. Hx AS s/p TAVR 11. CAD  Jen Mow, PA-C [facility] Pager: [phone] 12/01/2018, 4:20 PM   Pt  seen, examined and agree w A/P as above. Pt looks wet, plan HD today max UF as tolerated.  Extra HD planned for tomorrow.  Kelly Splinter MD [employer] pager [phone]   12/01/2018, 5:26 PM

## 2018-12-02 LAB — CBC WITH DIFFERENTIAL/PLATELET
Abs Immature Granulocytes: 0.04 10*3/uL (ref 0.00–0.07)
Basophils Absolute: 0 10*3/uL (ref 0.0–0.1)
Basophils Relative: 0 %
EOS ABS: 0.2 10*3/uL (ref 0.0–0.5)
Eosinophils Relative: 2 %
HCT: 30.8 % — ABNORMAL LOW (ref 39.0–52.0)
Hemoglobin: 9.3 g/dL — ABNORMAL LOW (ref 13.0–17.0)
Immature Granulocytes: 0 %
Lymphocytes Relative: 2 %
Lymphs Abs: 0.2 10*3/uL — ABNORMAL LOW (ref 0.7–4.0)
MCH: 26.5 pg (ref 26.0–34.0)
MCHC: 30.2 g/dL (ref 30.0–36.0)
MCV: 87.7 fL (ref 80.0–100.0)
Monocytes Absolute: 0.6 10*3/uL (ref 0.1–1.0)
Monocytes Relative: 6 %
Neutro Abs: 8.8 10*3/uL — ABNORMAL HIGH (ref 1.7–7.7)
Neutrophils Relative %: 90 %
Platelets: 322 10*3/uL (ref 150–400)
RBC: 3.51 MIL/uL — ABNORMAL LOW (ref 4.22–5.81)
RDW: 17.1 % — ABNORMAL HIGH (ref 11.5–15.5)
WBC: 9.8 10*3/uL (ref 4.0–10.5)
nRBC: 0 % (ref 0.0–0.2)

## 2018-12-02 LAB — GLUCOSE, CAPILLARY
Glucose-Capillary: 206 mg/dL — ABNORMAL HIGH (ref 70–99)
Glucose-Capillary: 244 mg/dL — ABNORMAL HIGH (ref 70–99)
Glucose-Capillary: 142 mg/dL — ABNORMAL HIGH (ref 70–99)
Glucose-Capillary: 308 mg/dL — ABNORMAL HIGH (ref 70–99)

## 2018-12-02 LAB — BASIC METABOLIC PANEL
Anion gap: 12 (ref 5–15)
BUN: 36 mg/dL — ABNORMAL HIGH (ref 8–23)
CO2: 27 mmol/L (ref 22–32)
Calcium: 8.7 mg/dL — ABNORMAL LOW (ref 8.9–10.3)
Chloride: 92 mmol/L — ABNORMAL LOW (ref 98–111)
Creatinine, Ser: 3.78 mg/dL — ABNORMAL HIGH (ref 0.61–1.24)
GFR calc Af Amer: 17 mL/min — ABNORMAL LOW (ref >60)
GFR calc non Af Amer: 14 mL/min — ABNORMAL LOW (ref >60)
Glucose, Bld: 225 mg/dL — ABNORMAL HIGH (ref 70–99)
Potassium: 4.8 mmol/L (ref 3.5–5.1)
Sodium: 131 mmol/L — ABNORMAL LOW (ref 135–145)

## 2018-12-02 LAB — MAGNESIUM: Magnesium: 2.1 mg/dL (ref 1.7–2.4)

## 2018-12-02 MED ORDER — CHLORHEXIDINE GLUCONATE CLOTH 2 % EX PADS: 6.0000 | MEDICATED_PAD | Freq: Every day | CUTANEOUS | Status: DC

## 2018-12-02 MED ORDER — ALPRAZOLAM 0.25 MG PO TABS: 0.2500 mg | ORAL_TABLET | Freq: Two times a day (BID) | ORAL | Status: DC | PRN

## 2018-12-02 MED ORDER — METHYLPREDNISOLONE SODIUM SUCC 40 MG IJ SOLR
40.0000 mg | Freq: Three times a day (TID) | INTRAMUSCULAR | Status: DC
  Administered 2018-12-02 – 2018-12-03 (×5): 40 mg via INTRAVENOUS
  Filled 2018-12-02 (×5): qty 1

## 2018-12-02 NOTE — Progress Notes (Signed)
Received patient back form Hemodialysis. Patient is alert and oriented, with family at bedside.

## 2018-12-02 NOTE — Progress Notes (Signed)
Patient ID: Jeffrey [name], male   DOB: [birth year], 78 y.o.   MRN: [id number]  PROGRESS NOTE    Jeffrey [name]  TKW:[id number] DOB: May 02, 1941 DOA: 11/29/2018 PCP: Thurman Coyer, MD   Brief Narrative:  78 year old male with history of CAD, combined systolic and diastolic CHF, severe left ear status post TAVR, A. 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   Assessment & Plan:   Active Problems:   Type 2 diabetes mellitus with chronic kidney disease on chronic dialysis, with long-term current use of insulin (HCC)   CAD (coronary artery disease)   Essential hypertension   ESRD (end stage renal disease) on dialysis [facility])   Atrial fibrillation (HCC) [I48.91]   Chest pain   Chronic respiratory failure with hypoxia (HCC)   COPD exacerbation (HCC)   RSV (respiratory syncytial virus infection)   Troponin level elevated   COPD exacerbation with probable viral bronchitis in a patient with chronic hypoxic respiratory failure/doubt that patient has bacterial pneumonia -Wheezing is improving.  Decrease Solu-Medrol to 40 mg IV every 8 hours.  Currently on 2 L oxygen via nasal cannula.  Continue duo nebs and Pulmicort and Zithromax.  Discontinued Rocephin since it is unlikely that patient has bacterial pneumonia -Respiratory virus panel is positive for RSV  Elevated troponin -Troponins did not trend up.  Cardiology evaluation appreciated.  Cardiology has signed off.  -Continue PRN nitroglycerin.  Patient apparently required few doses of nitroglycerin last night for chest pain.  Will monitor.  Outpatient follow-up with cardiology  Coronary artery disease -Stable.  Continue metoprolol, Plavix and statin  Permanent atrial fibrillation -Mildly bradycardic.  Continue Eliquis.  Continue metoprolol if blood  pressure and heart rate allows  Chronic combined systolic and diastolic heart failure/status post pacemaker -Volume managed by dialysis.  Strict input and output.  Daily weights  History of aortic stenosis status post TAVR -Stable.  Outpatient follow-up with cardiology  End-stage renal disease on hemodialysis -Nephrology following.  Dialysis as per nephrology schedule.  Anemia of chronic disease -From end-stage renal disease.  Hemoglobin stable.  Monitor  Mild hyperkalemia -From above.  Monitor.  Diabetes mellitus type 2 with hyperglycemia -Continue Levemir.  Continue CBGs with SSI.  Generalized deconditioning -PT eval.   DVT prophylaxis: Eliquis Code Status:   Full Family Communication: None at bedside Disposition Plan: Home pending clinical improvement in the next 2 to 3 days  Consultants: Cardiology/nephrology Procedures: None  Antimicrobials: Rocephin on 11/29/2018 Zithromax from 12/01/2018 onwards   Subjective: Patient seen and examined at bedside.  He is sleeping on chair.  Wakes up slightly.  Denies current chest pain.  Denies worsening shortness of breath or fever.  Nursing staff reports that patient had few doses of nitroglycerin overnight for chest pain and was very anxious overnight.  Objective: Vitals:   12/02/18 0645 12/02/18 0745 12/02/18 0746 12/02/18 0747  BP: (!) 152/65     Pulse: (!) 54     Resp: 20     Temp:      TempSrc:      SpO2: 98% 93% 93% 93%  Weight:      Height:        Intake/Output Summary (Last 24 hours) at 12/02/2018 0910 Last data filed at 12/02/2018 0000 Gross per 24 hour  Intake 480 ml  Output 2200 ml  Net -1720 ml   Filed Weights   12/01/18 0427 12/01/18 1246 12/01/18 1631  Weight: 110 kg 110.9 kg 108.6 kg    Examination:  General exam: Elderly male sitting on chair.  Sleeping.  Wakes up on calling his name.  Answers some questions. Respiratory system: Bilateral decreased breath sounds at bases with scattered crackles.  Very  minimal wheezing  cardiovascular system: S1 & S2 heard; mildly bradycardic Gastrointestinal system: Abdomen is nondistended, soft and nontender. Normal bowel sounds heard. Extremities: No cyanosis; trace edema  Data Reviewed: I have personally reviewed following labs and imaging studies  CBC: Recent Labs  Lab 11/29/18 1854 11/30/18 0331 12/01/18 1316 12/02/18 0455  WBC 6.2 5.7 11.7* 9.8  NEUTROABS 5.6  --   --  8.8*  HGB 9.2* 9.0* 8.9* 9.3*  HCT 31.4* 31.2* 28.8* 30.8*  MCV 90.2 91.5 89.2 87.7  PLT 315 303 358 814   Basic Metabolic Panel: Recent Labs  Lab 11/29/18 1854 11/30/18 0331 12/01/18 1316 12/02/18 0455  NA 131* 133* 128* 131*  K 5.6* 5.3* 5.7* 4.8  CL 95* 95* 91* 92*  CO2 25 22 22 27   GLUCOSE 118* 363* 169* 225*  BUN 26* 36* 64* 36*  CREATININE 4.12* 4.94* 5.93* 3.78*  CALCIUM 9.6 9.8 9.2 8.7*  MG  --   --   --  2.1  PHOS  --  5.7* 5.5*  --    GFR: Estimated Creatinine Clearance: 19.9 mL/min (A) (by C-G formula based on SCr of 3.78 mg/dL (H)). Liver Function Tests: Recent Labs  Lab 11/30/18 0331 12/01/18 1316  ALBUMIN 2.4* 2.5*   No results for input(s): LIPASE, AMYLASE in the last 168 hours. No results for input(s): AMMONIA in the last 168 hours. Coagulation Profile: No results for input(s): INR, PROTIME in the last 168 hours. Cardiac Enzymes: Recent Labs  Lab 11/29/18 1854 11/29/18 2301 11/30/18 0331 11/30/18 1118  TROPONINI 0.24* 0.20* 0.16* 1.15*   BNP (last 3 results) No results for input(s): PROBNP in the last 8760 hours. HbA1C: No results for input(s): HGBA1C in the last 72 hours. CBG: Recent Labs  Lab 12/01/18 0731 12/01/18 1137 12/01/18 1712 12/01/18 2136 12/02/18 0726  GLUCAP 164* 162* 110* 229* 206*   Lipid Profile: No results for input(s): CHOL, HDL, LDLCALC, TRIG, CHOLHDL, LDLDIRECT in the last 72 hours. Thyroid Function Tests: No results for input(s): TSH, T4TOTAL, FREET4, T3FREE, THYROIDAB in the last 72  hours. Anemia Panel: No results for input(s): VITAMINB12, FOLATE, FERRITIN, TIBC, IRON, RETICCTPCT in the last 72 hours. Sepsis Labs: Recent Labs  Lab 11/29/18 1908 11/30/18 0037  PROCALCITON  --  0.54  LATICACIDVEN 1.05  --     Recent Results (from the past 240 hour(s))  Respiratory Panel by PCR     Status: Abnormal   Collection Time: 11/29/18  6:54 PM  Result Value Ref Range Status   Adenovirus NOT DETECTED NOT DETECTED Final   Coronavirus 229E NOT DETECTED NOT DETECTED Final   Coronavirus HKU1 NOT DETECTED NOT DETECTED Final   Coronavirus NL63 NOT DETECTED NOT DETECTED Final   Coronavirus OC43 NOT DETECTED NOT DETECTED Final   Metapneumovirus NOT DETECTED NOT DETECTED Final   Rhinovirus / Enterovirus NOT DETECTED NOT DETECTED Final   Influenza A NOT DETECTED NOT DETECTED Final   Influenza B NOT DETECTED NOT DETECTED Final   Parainfluenza Virus 1 NOT DETECTED NOT DETECTED Final   Parainfluenza Virus 2 NOT DETECTED NOT DETECTED Final   Parainfluenza Virus 3 NOT DETECTED  NOT DETECTED Final   Parainfluenza Virus 4 NOT DETECTED NOT DETECTED Final   Respiratory Syncytial Virus DETECTED (A) NOT DETECTED Final    Comment: CRITICAL RESULT CALLED TO, READ BACK BY AND VERIFIED WITH: M. TOBIAS,RN 2326 11/29/2018 T. TYSOR    Bordetella pertussis NOT DETECTED NOT DETECTED Final   Chlamydophila pneumoniae NOT DETECTED NOT DETECTED Final   Mycoplasma pneumoniae NOT DETECTED NOT DETECTED Final  Culture, blood (routine x 2) Call MD if unable to obtain prior to antibiotics being given     Status: None (Preliminary result)   Collection Time: 11/30/18 12:37 AM  Result Value Ref Range Status   Specimen Description BLOOD RIGHT WRIST  Final   Special Requests   Final    BOTTLES DRAWN AEROBIC AND ANAEROBIC Blood Culture results may not be optimal due to an inadequate volume of blood received in culture bottles   Culture   Final    NO GROWTH 2 DAYS Performed at [facility], [place]  [address], [place], [place] [address]    Report Status PENDING  Incomplete  Culture, blood (routine x 2) Call MD if unable to obtain prior to antibiotics being given     Status: None (Preliminary result)   Collection Time: 11/30/18 12:40 AM  Result Value Ref Range Status   Specimen Description BLOOD RIGHT HAND  Final   Special Requests   Final    BOTTLES DRAWN AEROBIC AND ANAEROBIC Blood Culture results may not be optimal due to an inadequate volume of blood received in culture bottles   Culture   Final    NO GROWTH 2 DAYS Performed at [facility], [place] [address] [place], [place] [address]    Report Status PENDING  Incomplete  Culture, sputum-assessment     Status: None   Collection Time: 11/30/18 11:18 AM  Result Value Ref Range Status   Specimen Description EXPECTORATED SPUTUM  Final   Special Requests NONE  Final   Sputum evaluation   Final    Sputum specimen not acceptable for testing.  Please recollect.   CRITICAL RESULT CALLED TO, READ BACK BY AND VERIFIED WITH: IDI RN AT [id number] ON [id number] BY SJW Performed at [facility], [place] [address] [place], [place] [address]    Report Status 11/30/2018 FINAL  Final  MRSA PCR Screening     Status: None   Collection Time: 11/30/18  3:40 PM  Result Value Ref Range Status   MRSA by PCR NEGATIVE NEGATIVE Final    Comment:        The GeneXpert MRSA Assay (FDA approved for NASAL specimens only), is one component of a comprehensive MRSA colonization surveillance program. It is not intended to diagnose MRSA infection nor to guide or monitor treatment for MRSA infections. Performed at [facility], [place] [address], [place], [place] [address]          Radiology Studies: No results found.      Scheduled Meds: . apixaban  5 mg Oral BID  . arformoterol  15 mcg Nebulization BID  . atorvastatin  80 mg Oral Daily  . azithromycin  500 mg Oral Daily  . budesonide (PULMICORT) nebulizer solution  0.5 mg Nebulization BID  .  Chlorhexidine Gluconate Cloth  6 each Topical Q0600  . cinacalcet  30 mg Oral Q supper  . clopidogrel  75 mg Oral Daily  . [START ON 12/03/2018] doxercalciferol  2 mcg Intravenous Q M,W,F-HD  . feeding supplement (PRO-STAT SUGAR  FREE 64)  30 mL Oral BID  . guaiFENesin  600 mg Oral BID  . insulin aspart  0-5 Units Subcutaneous QHS  . insulin aspart  0-9 Units Subcutaneous TID WC  . insulin detemir  50 Units Subcutaneous QHS  . ipratropium-albuterol  3 mL Nebulization QID  . methylPREDNISolone (SOLU-MEDROL) injection  60 mg Intravenous Q8H  . metoprolol tartrate  12.5 mg Oral BID  . pantoprazole  40 mg Oral Daily  . sevelamer carbonate  4,000 mg Oral TID WC   Continuous Infusions:   LOS: 3 days        Aline August, MD [facility] Pager [phone]  If 7PM-7AM, please contact night-coverage [email or web address] Password [email or web address] 12/02/2018, 9:10 AM

## 2018-12-02 NOTE — Care Management Important Message (Signed)
Important Message  Patient Details  Name: Jeffrey [name] MRN: [id number] Date of Birth: [birth year]   Medicare Important Message Given:  Yes    Lexx Monte P O'Neill 12/02/2018, 2:20 PM

## 2018-12-02 NOTE — Plan of Care (Signed)

## 2018-12-02 NOTE — Progress Notes (Addendum)
Patients daughter [name]) was asking to speak with case management regarding equipment (wheelchair, lift, shower chair, transportation to Dialysis).  Daughter stated that she will be coming tomorrow around 12 pm to speak with case management.  Chriss Driver CM aware of daughters requests.

## 2018-12-02 NOTE — Progress Notes (Addendum)
[facility] Progress Note  Subjective: feeling / breathing much better, still very weak  Vitals:   12/02/18 0745 12/02/18 0746 12/02/18 0747 12/02/18 1129  BP:      Pulse:      Resp:      Temp:      TempSrc:      SpO2: 93% 93% 93% 98%  Weight:    110.2 kg  Height:        Inpatient medications: . apixaban  5 mg Oral BID  . arformoterol  15 mcg Nebulization BID  . atorvastatin  80 mg Oral Daily  . azithromycin  500 mg Oral Daily  . budesonide (PULMICORT) nebulizer solution  0.5 mg Nebulization BID  . Chlorhexidine Gluconate Cloth  6 each Topical Q0600  . cinacalcet  30 mg Oral Q supper  . clopidogrel  75 mg Oral Daily  . [START ON 12/03/2018] doxercalciferol  2 mcg Intravenous Q M,W,F-HD  . feeding supplement (PRO-STAT SUGAR FREE 64)  30 mL Oral BID  . guaiFENesin  600 mg Oral BID  . insulin aspart  0-5 Units Subcutaneous QHS  . insulin aspart  0-9 Units Subcutaneous TID WC  . insulin detemir  50 Units Subcutaneous QHS  . ipratropium-albuterol  3 mL Nebulization QID  . methylPREDNISolone (SOLU-MEDROL) injection  40 mg Intravenous Q8H  . metoprolol tartrate  12.5 mg Oral BID  . pantoprazole  40 mg Oral Daily  . sevelamer carbonate  4,000 mg Oral TID WC    acetaminophen **OR** acetaminophen, ALPRAZolam, fentaNYL (SUBLIMAZE) injection, ipratropium-albuterol, lidocaine-prilocaine, Melatonin, nitroGLYCERIN, ondansetron **OR** ondansetron (ZOFRAN) IV, sevelamer carbonate  Iron/TIBC/Ferritin/ %Sat    Component Value Date/Time   IRON 59 01/12/2015 0730   TIBC 227 01/12/2015 0730   FERRITIN 189 01/12/2015 0730   IRONPCTSAT 26 01/12/2015 0730    Exam: General: WDWN, NAD, chronically ill appearing, elderly male Head: NCAT sclera not icteric MMM Neck: Supple. No lymphadenopathy Lungs: BS decreased, scattered rales at bases, much better Heart: RRR. +1/0 systolic murmur, no rubs or gallops.  Abdomen: soft, nontender, +BS, no guarding, no rebound tenderness  Lower  extremities:2+ bilat LE edema improving Neuro: AAOx3. Moves all extremities spontaneously. Psych:  Responds to questions appropriately with a normal affect. Dialysis Access: LU AVF +b/t  Dialysis Orders:  MWF NW  4h  400/800   105kg   2K/3.5 bath  LUA AVF  Hep none  M225 every 2wks last 12/24  Hect 2 ug tiw   Assessment: 1. Dyspnea 2. COPD exac 3. Volume overload/ pulm edema 4. ESRD HD MWF 5. HTN 6. 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 Chronic syst CHF 9. AS s/p TAVR 10. Atrial fib 11. CAD 42. Debility may improve w/ UF   Plan: 1. HD again today and tomorrow, get vol down further 2. Lower edw at [place] pager [phone]   12/02/2018, 12:41 PM   Recent Labs  Lab 11/30/18 0331 12/01/18 1316 12/02/18 0455  NA 133* 128* 131*  K 5.3* 5.7* 4.8  CL 95* 91* 92*  CO2 22 22 27   GLUCOSE 363* 169* 225*  BUN 36* 64* 36*  CREATININE 4.94* 5.93* 3.78*  CALCIUM 9.8 9.2 8.7*  PHOS 5.7* 5.5*  --   ALBUMIN 2.4* 2.5*  --    No results for input(s): AST, ALT, ALKPHOS, BILITOT, PROT in the last 168 hours. Recent Labs  Lab 11/29/18 1854  12/01/18 1316 12/02/18 0455  WBC 6.2   < > 11.7* 9.8  NEUTROABS 5.6  --   --  8.8*  HGB 9.2*   < > 8.9* 9.3*  HCT 31.4*   < > 28.8* 30.8*  MCV 90.2   < > 89.2 87.7  PLT 315   < > 358 322   < > = values in this interval not displayed.

## 2018-12-02 NOTE — Care Management (Signed)
1447 12-02-18 CM did go by room to speak with patient in regards to offering choice for DME. Patient was in HD at time of visit. CM did try to call wife and received voicemail. Will continue to monitor for additional transition of care needs. Bethena Roys, RN,BSN Case Manager [phone]

## 2018-12-03 ENCOUNTER — Inpatient Hospital Stay (HOSPITAL_COMMUNITY): Payer: Medicare Other

## 2018-12-03 LAB — GLUCOSE, CAPILLARY
GLUCOSE-CAPILLARY: 189 mg/dL — AB (ref 70–99)
Glucose-Capillary: 173 mg/dL — ABNORMAL HIGH (ref 70–99)
Glucose-Capillary: 151 mg/dL — ABNORMAL HIGH (ref 70–99)
Glucose-Capillary: 174 mg/dL — ABNORMAL HIGH (ref 70–99)
Glucose-Capillary: 230 mg/dL — ABNORMAL HIGH (ref 70–99)

## 2018-12-03 LAB — RENAL FUNCTION PANEL
ALBUMIN: 2.3 g/dL — AB (ref 3.5–5.0)
Anion gap: 11 (ref 5–15)
BUN: 35 mg/dL — ABNORMAL HIGH (ref 8–23)
CO2: 26 mmol/L (ref 22–32)
Calcium: 8.8 mg/dL — ABNORMAL LOW (ref 8.9–10.3)
Chloride: 94 mmol/L — ABNORMAL LOW (ref 98–111)
Creatinine, Ser: 3.06 mg/dL — ABNORMAL HIGH (ref 0.61–1.24)
GFR calc Af Amer: 21 mL/min — ABNORMAL LOW (ref >60)
GFR calc non Af Amer: 18 mL/min — ABNORMAL LOW (ref >60)
Glucose, Bld: 303 mg/dL — ABNORMAL HIGH (ref 70–99)
POTASSIUM: 4.4 mmol/L (ref 3.5–5.1)
Phosphorus: 3.9 mg/dL (ref 2.5–4.6)
Sodium: 131 mmol/L — ABNORMAL LOW (ref 135–145)

## 2018-12-03 LAB — CBC
HCT: 30.4 % — ABNORMAL LOW (ref 39.0–52.0)
Hemoglobin: 9.2 g/dL — ABNORMAL LOW (ref 13.0–17.0)
MCH: 27.1 pg (ref 26.0–34.0)
MCHC: 30.3 g/dL (ref 30.0–36.0)
MCV: 89.4 fL (ref 80.0–100.0)
Platelets: 329 10*3/uL (ref 150–400)
RBC: 3.4 MIL/uL — ABNORMAL LOW (ref 4.22–5.81)
RDW: 17.2 % — ABNORMAL HIGH (ref 11.5–15.5)
WBC: 9.5 10*3/uL (ref 4.0–10.5)
nRBC: 0 % (ref 0.0–0.2)

## 2018-12-03 MED ORDER — DOXERCALCIFEROL 4 MCG/2ML IV SOLN
INTRAVENOUS | Status: AC
  Administered 2018-12-03: 2 ug via INTRAVENOUS
  Filled 2018-12-03: qty 2

## 2018-12-03 MED ORDER — HYDROXYZINE HCL 25 MG PO TABS
25.0000 mg | ORAL_TABLET | Freq: Three times a day (TID) | ORAL | Status: DC | PRN
  Administered 2018-12-03 – 2018-12-06 (×4): 25 mg via ORAL
  Filled 2018-12-03 (×5): qty 1

## 2018-12-03 MED ORDER — IPRATROPIUM-ALBUTEROL 0.5-2.5 (3) MG/3ML IN SOLN
3.0000 mL | Freq: Three times a day (TID) | RESPIRATORY_TRACT | Status: DC
  Administered 2018-12-03 – 2018-12-06 (×9): 3 mL via RESPIRATORY_TRACT
  Filled 2018-12-03 (×10): qty 3

## 2018-12-03 MED ORDER — METHYLPREDNISOLONE SODIUM SUCC 40 MG IJ SOLR
40.0000 mg | Freq: Every day | INTRAMUSCULAR | Status: DC
  Administered 2018-12-04 – 2018-12-05 (×2): 40 mg via INTRAVENOUS
  Filled 2018-12-03 (×2): qty 1

## 2018-12-03 NOTE — Progress Notes (Signed)
PROGRESS NOTE    Jeffrey [name]  JIR:[id number] DOB: 02-Sep-1941 DOA: 11/29/2018 PCP: Thurman Coyer, MD   Brief Narrative: Jeffrey [name] is a 78 y.o. male with history of CAD, combined systolic and diastolic heart failure, aortic stenosis status post TAVR, atrial fibrillation, peripheral vascular disease, ESRD on HD, diabetes mellitus, COPD with chronic respiratory failure on home oxygen.  He presented secondary to cough with fever with concern for pneumonia.  He was on empiric antibiotics with a positive respiratory virus panel for RSV.  Antibiotics were narrowed to accommodate for associated COPD exacerbation.  Improving slowly.   Assessment & Plan:   Active Problems:   Type 2 diabetes mellitus with chronic kidney disease on chronic dialysis, with long-term current use of insulin (HCC)   CAD (coronary artery disease)   Essential hypertension   ESRD (end stage renal disease) on dialysis [facility])   Atrial fibrillation (HCC) [I48.91]   Chest pain   Chronic respiratory failure with hypoxia (HCC)   COPD exacerbation (HCC)   RSV (respiratory syncytial virus infection)   Troponin level elevated   RSV bronchitis Antibiotics discontinued. Supportive care -Mucinex -Flutter valve -Continue Duoneb prn  COPD exacerbation -Decrease to solumedrol daily -Continue Brovana, Duoneb, Pulmicort -Continue Azithromycin  Chronic respiratory failure with hypoxia -Continue 3L via [place]  Dysphagia Question if patient is aspirating. RVP positive for RSV so doubting aspiration bacterial pneumonia. Dysphagia is contributing to patient's increased fluid intake, however, which put patient above dry weight for ESRD. -SLP evaluation  CAD Stable. -Continue metoprolol, plavix and Lipitor  Permanent atrial fibrillation Rate controlled. -Continue Eliquis and metoprolol  Aortic stenosis s/p TAVR Stable  ESRD on HD Nephrology following  Anemia of chronic disease In setting of kidney  disease  Diabetes mellitus, type 2 -Continue Levemir and SSI  Right shoulder weakness Associated discomfort. -Right shoulder x-ray  DVT prophylaxis: Eliquis Code Status:   Code Status: Full Code Family Communication: Wife, son and daughter at bedside Disposition Plan: Discharge to SNF in 24 to 48 hours pending SLP evaluation and symptomatic improvement   Consultants:   Nephrology  Procedures:   HD  Antimicrobials:  Ceftriaxone  Azithromycin    Subjective: Right shoulder weakness with associated discomfort. Some shortness of breath.  Objective: Vitals:   12/03/18 1403 12/03/18 1946 12/03/18 2043 12/03/18 2048  BP:  (!) 116/58    Pulse:  65 64 68  Resp:  20    Temp:  97.7 F (36.5 C)    TempSrc:  Oral    SpO2: 99% 99% 100%   Weight:      Height:        Intake/Output Summary (Last 24 hours) at 12/03/2018 2103 Last data filed at 12/03/2018 2035 Gross per 24 hour  Intake 720 ml  Output 4199 ml  Net -3479 ml   Filed Weights   12/03/18 0424 12/03/18 0715 12/03/18 1121  Weight: 108 kg 108.2 kg 104 kg    Examination:  General exam: Appears calm and comfortable Respiratory system: Wheezing with rhonchi Respiratory effort normal. Cardiovascular system: S1 & S2 heard, RRR. No murmurs, rubs, gallops or clicks. Gastrointestinal system: Abdomen is nondistended, soft and nontender. No organomegaly or masses felt. Normal bowel sounds heard. Central nervous system: Alert and oriented. No focal neurological deficits. Extremities: 2+ pitting edema. No calf tenderness. Right shoulder with 2/5 strength and good passive range of motion. Skin: No cyanosis. No rashes Psychiatry: Judgement and insight appear normal. Mood & affect appropriate.     Data  Reviewed: I have personally reviewed following labs and imaging studies  CBC: Recent Labs  Lab 11/29/18 1854 11/30/18 0331 12/01/18 1316 12/02/18 0455 12/03/18 0715  WBC 6.2 5.7 11.7* 9.8 9.5  NEUTROABS 5.6  --   --   8.8*  --   HGB 9.2* 9.0* 8.9* 9.3* 9.2*  HCT 31.4* 31.2* 28.8* 30.8* 30.4*  MCV 90.2 91.5 89.2 87.7 89.4  PLT 315 303 358 322 759   Basic Metabolic Panel: Recent Labs  Lab 11/29/18 1854 11/30/18 0331 12/01/18 1316 12/02/18 0455 12/03/18 0715  NA 131* 133* 128* 131* 131*  K 5.6* 5.3* 5.7* 4.8 4.4  CL 95* 95* 91* 92* 94*  CO2 25 22 22 27 26   GLUCOSE 118* 363* 169* 225* 303*  BUN 26* 36* 64* 36* 35*  CREATININE 4.12* 4.94* 5.93* 3.78* 3.06*  CALCIUM 9.6 9.8 9.2 8.7* 8.8*  MG  --   --   --  2.1  --   PHOS  --  5.7* 5.5*  --  3.9   GFR: Estimated Creatinine Clearance: 24 mL/min (A) (by C-G formula based on SCr of 3.06 mg/dL (H)). Liver Function Tests: Recent Labs  Lab 11/30/18 0331 12/01/18 1316 12/03/18 0715  ALBUMIN 2.4* 2.5* 2.3*   No results for input(s): LIPASE, AMYLASE in the last 168 hours. No results for input(s): AMMONIA in the last 168 hours. Coagulation Profile: No results for input(s): INR, PROTIME in the last 168 hours. Cardiac Enzymes: Recent Labs  Lab 11/29/18 1854 11/29/18 2301 11/30/18 0331 11/30/18 1118  TROPONINI 0.24* 0.20* 0.16* 1.15*   BNP (last 3 results) No results for input(s): PROBNP in the last 8760 hours. HbA1C: No results for input(s): HGBA1C in the last 72 hours. CBG: Recent Labs  Lab 12/02/18 2120 12/03/18 0913 12/03/18 1211 12/03/18 1336 12/03/18 1723  GLUCAP 308* 173* 151* 174* 230*   Lipid Profile: No results for input(s): CHOL, HDL, LDLCALC, TRIG, CHOLHDL, LDLDIRECT in the last 72 hours. Thyroid Function Tests: No results for input(s): TSH, T4TOTAL, FREET4, T3FREE, THYROIDAB in the last 72 hours. Anemia Panel: No results for input(s): VITAMINB12, FOLATE, FERRITIN, TIBC, IRON, RETICCTPCT in the last 72 hours. Sepsis Labs: Recent Labs  Lab 11/29/18 1908 11/30/18 0037  PROCALCITON  --  0.54  LATICACIDVEN 1.05  --     Recent Results (from the past 240 hour(s))  Respiratory Panel by PCR     Status: Abnormal    Collection Time: 11/29/18  6:54 PM  Result Value Ref Range Status   Adenovirus NOT DETECTED NOT DETECTED Final   Coronavirus 229E NOT DETECTED NOT DETECTED Final   Coronavirus HKU1 NOT DETECTED NOT DETECTED Final   Coronavirus NL63 NOT DETECTED NOT DETECTED Final   Coronavirus OC43 NOT DETECTED NOT DETECTED Final   Metapneumovirus NOT DETECTED NOT DETECTED Final   Rhinovirus / Enterovirus NOT DETECTED NOT DETECTED Final   Influenza A NOT DETECTED NOT DETECTED Final   Influenza B NOT DETECTED NOT DETECTED Final   Parainfluenza Virus 1 NOT DETECTED NOT DETECTED Final   Parainfluenza Virus 2 NOT DETECTED NOT DETECTED Final   Parainfluenza Virus 3 NOT DETECTED NOT DETECTED Final   Parainfluenza Virus 4 NOT DETECTED NOT DETECTED Final   Respiratory Syncytial Virus DETECTED (A) NOT DETECTED Final    Comment: CRITICAL RESULT CALLED TO, READ BACK BY AND VERIFIED WITH: M. TOBIAS,RN 2326 11/29/2018 T. TYSOR    Bordetella pertussis NOT DETECTED NOT DETECTED Final   Chlamydophila pneumoniae NOT DETECTED NOT DETECTED Final  Mycoplasma pneumoniae NOT DETECTED NOT DETECTED Final  Culture, blood (routine x 2) Call MD if unable to obtain prior to antibiotics being given     Status: None (Preliminary result)   Collection Time: 11/30/18 12:37 AM  Result Value Ref Range Status   Specimen Description BLOOD RIGHT WRIST  Final   Special Requests   Final    BOTTLES DRAWN AEROBIC AND ANAEROBIC Blood Culture results may not be optimal due to an inadequate volume of blood received in culture bottles   Culture   Final    NO GROWTH 3 DAYS Performed at [facility], [place] [address] [place], [place] [address]    Report Status PENDING  Incomplete  Culture, blood (routine x 2) Call MD if unable to obtain prior to antibiotics being given     Status: None (Preliminary result)   Collection Time: 11/30/18 12:40 AM  Result Value Ref Range Status   Specimen Description BLOOD RIGHT HAND  Final   Special Requests    Final    BOTTLES DRAWN AEROBIC AND ANAEROBIC Blood Culture results may not be optimal due to an inadequate volume of blood received in culture bottles   Culture   Final    NO GROWTH 3 DAYS Performed at [facility], [place] [address] [place], [place] [address]    Report Status PENDING  Incomplete  Culture, sputum-assessment     Status: None   Collection Time: 11/30/18 11:18 AM  Result Value Ref Range Status   Specimen Description EXPECTORATED SPUTUM  Final   Special Requests NONE  Final   Sputum evaluation   Final    Sputum specimen not acceptable for testing.  Please recollect.   CRITICAL RESULT CALLED TO, READ BACK BY AND VERIFIED WITH: IDI RN AT 3151 ON [id number] BY SJW Performed at [facility], [place] [address] [place], [place] [address]    Report Status 11/30/2018 FINAL  Final  MRSA PCR Screening     Status: None   Collection Time: 11/30/18  3:40 PM  Result Value Ref Range Status   MRSA by PCR NEGATIVE NEGATIVE Final    Comment:        The GeneXpert MRSA Assay (FDA approved for NASAL specimens only), is one component of a comprehensive MRSA colonization surveillance program. It is not intended to diagnose MRSA infection nor to guide or monitor treatment for MRSA infections. Performed at [facility], [place] [address], [place], [place] [address]          Radiology Studies: Dg Shoulder Right  Result Date: 12/03/2018 CLINICAL DATA:  Shoulder pain EXAM: RIGHT SHOULDER - 2+ VIEW COMPARISON:  09/02/2009 FINDINGS: Chronic widening of the [facility] joint. High-riding appearance of the humeral head with decreased subacromial space. Calcific tendinitis. No fracture or dislocation. Right-sided pacing generator IMPRESSION: 1. No acute osseous abnormality 2. High-riding right humeral head suggesting rotator cuff disease. Small amount of calcific tendinitis at the humeral head Electronically Signed   By: Donavan Foil M.D.   On: 12/03/2018 17:37        Scheduled Meds: .  apixaban  5 mg Oral BID  . arformoterol  15 mcg Nebulization BID  . atorvastatin  80 mg Oral Daily  . azithromycin  500 mg Oral Daily  . budesonide (PULMICORT) nebulizer solution  0.5 mg Nebulization BID  . Chlorhexidine Gluconate Cloth  6 each Topical Q0600  . Chlorhexidine Gluconate Cloth  6 each Topical Q0600  . cinacalcet  30  mg Oral Q supper  . clopidogrel  75 mg Oral Daily  . doxercalciferol  2 mcg Intravenous Q M,W,F-HD  . feeding supplement (PRO-STAT SUGAR FREE 64)  30 mL Oral BID  . guaiFENesin  600 mg Oral BID  . insulin aspart  0-5 Units Subcutaneous QHS  . insulin aspart  0-9 Units Subcutaneous TID WC  . insulin detemir  50 Units Subcutaneous QHS  . ipratropium-albuterol  3 mL Nebulization TID  . methylPREDNISolone (SOLU-MEDROL) injection  40 mg Intravenous Q8H  . metoprolol tartrate  12.5 mg Oral BID  . pantoprazole  40 mg Oral Daily  . sevelamer carbonate  4,000 mg Oral TID WC   Continuous Infusions:   LOS: 4 days     Cordelia Poche, MD [facility] 12/03/2018, 9:03 PM  If 7PM-7AM, please contact night-coverage [email or web address]

## 2018-12-03 NOTE — Progress Notes (Signed)
Pt started bleeding from AV fistula site overnight. HD sent supplies to stop bleeding. Bleeding restarted at 0430 repeated process. Will continue to monitor.

## 2018-12-03 NOTE — Plan of Care (Signed)

## 2018-12-03 NOTE — Progress Notes (Signed)
[facility] Progress Note  Subjective: 3 L off yest and on HD now, still over edw  Vitals:   12/03/18 1000 12/03/18 1030 12/03/18 1045 12/03/18 1100  BP: 109/69 (!) 104/56 137/83 (!) 149/122  Pulse: (!) 50 (!) 58 61 88  Resp:      Temp:      TempSrc:      SpO2:      Weight:      Height:        Inpatient medications: . apixaban  5 mg Oral BID  . arformoterol  15 mcg Nebulization BID  . atorvastatin  80 mg Oral Daily  . azithromycin  500 mg Oral Daily  . budesonide (PULMICORT) nebulizer solution  0.5 mg Nebulization BID  . Chlorhexidine Gluconate Cloth  6 each Topical Q0600  . Chlorhexidine Gluconate Cloth  6 each Topical Q0600  . cinacalcet  30 mg Oral Q supper  . clopidogrel  75 mg Oral Daily  . doxercalciferol  2 mcg Intravenous Q M,W,F-HD  . feeding supplement (PRO-STAT SUGAR FREE 64)  30 mL Oral BID  . guaiFENesin  600 mg Oral BID  . insulin aspart  0-5 Units Subcutaneous QHS  . insulin aspart  0-9 Units Subcutaneous TID WC  . insulin detemir  50 Units Subcutaneous QHS  . ipratropium-albuterol  3 mL Nebulization TID  . methylPREDNISolone (SOLU-MEDROL) injection  40 mg Intravenous Q8H  . metoprolol tartrate  12.5 mg Oral BID  . pantoprazole  40 mg Oral Daily  . sevelamer carbonate  4,000 mg Oral TID WC    acetaminophen **OR** acetaminophen, ALPRAZolam, ipratropium-albuterol, lidocaine-prilocaine, Melatonin, nitroGLYCERIN, ondansetron **OR** ondansetron (ZOFRAN) IV, sevelamer carbonate  Iron/TIBC/Ferritin/ %Sat    Component Value Date/Time   IRON 59 01/12/2015 0730   TIBC 227 01/12/2015 0730   FERRITIN 189 01/12/2015 0730   IRONPCTSAT 26 01/12/2015 0730    Exam: General: WDWN, NAD, chronically ill appearing, elderly male Head: NCAT sclera not icteric MMM Neck: Supple. No lymphadenopathy Lungs: BS decreased, clear, no rales or wheezing Heart: RRR. +9/0 systolic murmur, no rubs or gallops.  Abdomen: soft, nontender, +BS, no guarding, no rebound  tenderness  Ext: edema improving , 1+ now Neuro: AAOx3. Moves all extremities spontaneously. Psych:  Responds to questions appropriately with a normal affect. Dialysis Access: LU AVF +b/t  Dialysis Orders:  MWF [facility]  4h  400/800   105kg   2K/3.5 bath  LUA AVF  Hep none  M225 every 2wks last 12/24  Hect 2 ug tiw   Assessment: 1. Dyspnea 2/2 vol overload, improved 2. COPD exac 3. Volume overload/ pulm edema 4. ESRD HD MWF 5. HTN 6. 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 Chronic syst CHF 9. AS s/p TAVR 10. Atrial fib 11. CAD 89. Debility may improve w/ UF   Plan: 1. HD today, UF to dry wt possibly lower dry wt slightly 2. OK for dc after from renal standpoint    Kelly Splinter MD [place] pager [phone]   12/03/2018, 11:30 AM   Recent Labs  Lab 12/01/18 1316 12/02/18 0455 12/03/18 0715  NA 128* 131* 131*  K 5.7* 4.8 4.4  CL 91* 92* 94*  CO2 22 27 26   GLUCOSE 169* 225* 303*  BUN 64* 36* 35*  CREATININE 5.93* 3.78* 3.06*  CALCIUM 9.2 8.7* 8.8*  PHOS 5.5*  --  3.9  ALBUMIN 2.5*  --  2.3*   No results for input(s): AST, ALT, ALKPHOS, BILITOT,  PROT in the last 168 hours. Recent Labs  Lab 11/29/18 1854  12/02/18 0455 12/03/18 0715  WBC 6.2   < > 9.8 9.5  NEUTROABS 5.6  --  8.8*  --   HGB 9.2*   < > 9.3* 9.2*  HCT 31.4*   < > 30.8* 30.4*  MCV 90.2   < > 87.7 89.4  PLT 315   < > 322 329   < > = values in this interval not displayed.

## 2018-12-03 NOTE — Evaluation (Signed)
Physical Therapy Evaluation Patient Details Name: Jeffrey [name] MRN: [id number] DOB: 09/20/1941 Today's Date: 12/03/2018   History of Present Illness  Pt is a 78 y.o. male admitted 11/29/17 with cough and fever; worked up for COPD exacerbation with probable viral bronchitis. PMH includes CAD, CHF, s/p TAVR, afib, pacemaker, ESRD on HD (MWF), DM2, COPD (on 3L O2 baseline).     Clinical Impression  Pt presents with an overall decrease in functional mobility secondary to above. PTA, pt with progressive weakness requiring increased assist from family; was apparently ambulatory with RW four months ago, but physical condition has declined past couple weeks, now requiring maxA for transfers and ADLs. Today, pt unable to stand despite maxA. Increased time spent discussing recommendation for SNF-level therapies; family hopes to take pt home (but still may consider SNF). If to return home, pt will need hoyer lift, wheelchair, hospital bed and max HH services. Will follow acutely to address established goals.   Follow Up Recommendations SNF;Supervision/Assistance - 24 hour(family unsure; if declined, will need max HH services)    Equipment Recommendations  Wheelchair (measurements PT);Hospital bed;Other (comment)(hoyer lift with sling)    Recommendations for Other Services       Precautions / Restrictions Precautions Precautions: Fall Restrictions Weight Bearing Restrictions: No      Mobility  Bed Mobility               General bed mobility comments: Received sitting EOB leaning on HOB elevated on R-side; max-totalA to scoot hips to EOB  Transfers Overall transfer level: Needs assistance               General transfer comment: Unable to stand despite maxA and elevated bed height  Ambulation/Gait                Stairs            Wheelchair Mobility    Modified Rankin (Stroke Patients Only)       Balance Overall balance assessment: Needs assistance    Sitting balance-Leahy Scale: Poor Sitting balance - Comments: R lateral lean onto elevated HOB; able to bring trunk to midline and hold without assist, easily fatigued; fearful of following, modA to correct posterior LOB     Standing balance-Leahy Scale: Zero                               Pertinent Vitals/Pain Pain Assessment: No/denies pain    Home Living Family/patient expects to be discharged to:: Private residence Living Arrangements: Spouse/significant other;Children Available Help at Discharge: Family;Available 24 hours/day Type of Home: House Home Access: Ramped entrance     Home Layout: One level Home Equipment: Walker - 2 wheels;Grab bars - toilet;Grab bars - tub/shower;Cane - single point;Bedside commode;Walker - 4 wheels Additional Comments: home O2    Prior Function Level of Independence: Needs assistance   Gait / Transfers Assistance Needed: Initially, pt ambulatory with RW. Over past couple weeks, has required increasing assist for transfers with RW. Since last week, pt max-totalA due to weakness  ADL's / Homemaking Assistance Needed: Wife assist for all ADLs. Past few days, wife providing bed baths.   Comments: Daughter works as Designer, multimedia at senior living facility and hesitant to have pt d/c to SNF     [employer]        Extremity/Trunk Assessment   Upper Extremity Assessment Upper Extremity Assessment: RUE deficits/detail;LUE deficits/detail RUE Deficits / Details: Decreased grip strength,  difficulty bringing hand to mouth; elbow/shoulder strength functionally <3/5 LUE Deficits / Details: pt reports restricted LUE ROM due to fistula bleeding (currently wrapped); grip strength <3/5    Lower Extremity Assessment Lower Extremity Assessment: RLE deficits/detail;LLE deficits/detail RLE Deficits / Details: R hip flex <3/5, knee flex/ext 3/5, ankle 3/5 LLE Deficits / Details: LLE <3/5 functionally    Cervical / Trunk Assessment Cervical / Trunk  Assessment: Kyphotic  Communication   Communication: HOH  Cognition Arousal/Alertness: Awake/alert Behavior During Therapy: Flat affect Overall Cognitive Status: Within Functional Limits for tasks assessed                                 General Comments: WFL for simple tasks      General Comments General comments (skin integrity, edema, etc.): Wife, daughter and son present during session. Increased time spent discussing home set-up and equipment needs.    Exercises     Assessment/Plan    PT Assessment Patient needs continued PT services  PT Problem List Decreased strength;Decreased range of motion;Decreased activity tolerance;Decreased balance;Decreased mobility;Decreased knowledge of use of DME       PT Treatment Interventions DME instruction;Gait training;Functional mobility training;Therapeutic activities;Therapeutic exercise;Balance training;Patient/family education;Wheelchair mobility training    PT Goals (Current goals can be found in the Care Plan section)  Acute Rehab PT Goals Patient Stated Goal: "Get back to walking" PT Goal Formulation: With patient Time For Goal Achievement: 12/17/18 Potential to Achieve Goals: Fair    Frequency Min 3X/week   Barriers to discharge        Co-evaluation               AM-PAC PT "6 Clicks" Mobility  Outcome Measure Help needed turning from your back to your side while in a flat bed without using bedrails?: A Lot Help needed moving from lying on your back to sitting on the side of a flat bed without using bedrails?: A Lot Help needed moving to and from a bed to a chair (including a wheelchair)?: Total Help needed standing up from a chair using your arms (e.g., wheelchair or bedside chair)?: Total Help needed to walk in hospital room?: Total Help needed climbing 3-5 steps with a railing? : Total 6 Click Score: 8    End of Session   Activity Tolerance: Patient limited by fatigue Patient left: in  bed;with call bell/phone within reach;with bed alarm set Nurse Communication: Mobility status;Need for lift equipment PT Visit Diagnosis: Other abnormalities of gait and mobility (R26.89);Muscle weakness (generalized) (M62.81)    Time: 1521-1550 PT Time Calculation (min) (ACUTE ONLY): 29 min   Charges:   PT Evaluation $PT Eval Moderate Complexity: 1 Mod PT Treatments $Self Care/Home Management: 8-22      Mabeline Caras, PT, DPT Acute Rehabilitation Services  Pager [phone] Office [phone]  Derry Lory 12/03/2018, 4:21 PM

## 2018-12-03 NOTE — Progress Notes (Signed)
Patient was taken to Hemodialysis upon my shift arrival.

## 2018-12-03 NOTE — Clinical Social Work Note (Signed)
Clinical Social Work Assessment  Patient Details  Name: Jeffrey [name] MRN: [id number] Date of Birth: 1941-06-29  Date of referral:  12/03/18               Reason for consult:  Facility Placement, Discharge Planning                Permission sought to share information with:  Facility Sport and exercise psychologist, Family Supports Permission granted to share information::  Yes, Verbal Permission Granted  Name::     Jeffrey [name] and Jeffrey [name], Jeffrey [name]  Agency::     Relationship::  Wife, son, and daughter  Contact Information:  Jeffrey [name]: [phone]: [phone], [name]: [phone]  Housing/Transportation Living arrangements for the past 2 months:  Single Family Home Source of Information:  Patient, Medical Team, Adult Children, Spouse Patient Interpreter Needed:  None Criminal Activity/Legal Involvement Pertinent to Current Situation/Hospitalization:  No - Comment as needed Significant Relationships:  Adult Children, Spouse Lives with:  Spouse Do you feel safe going back to the place where you live?  Yes Need for family participation in patient care:  Yes (Comment)  Care giving concerns:  Transportation to HD.   Social Worker assessment / plan:  CSW met with patient, Wife, son, and daughter at bedside. CSW introduced role and inquired about needs. Patient and family confirmed need for transportation resources to HD. Patient confirmed he lives in [place] but gets HD in [place]. He and his wife recently moved to [place] to be closer to their daughter and they are planning his HD to a [facility] center. CSW provided information for [facility] and [facility]. Patient's wife has been transporting him to HD but she cannot physically assist him anymore. No further concerns. CSW signing off as social work intervention is no longer needed.  Employment status:  Retired Nurse, adult PT Recommendations:  Not assessed at this time Information / Referral to  community resources:  Other (Comment Required)([facility], [facility].)  Patient/Family's Response to care:  Patient and his family agreeable to resources. Patient's family supportive and involved in patient's care. Patient and his family appreciated social work intervention.  Patient/Family's Understanding of and Emotional Response to Diagnosis, Current Treatment, and Prognosis:  Patient and his family have a [name] understanding of the reason for admission and social work consult. Patient and his family appear happy with hospital care.  Emotional Assessment Appearance:  Appears stated age Attitude/Demeanor/Rapport:  Engaged, Gracious Affect (typically observed):  Accepting, Appropriate, Calm, Pleasant Orientation:  Oriented to Self, Oriented to Place, Oriented to  Time, Oriented to Situation Alcohol / Substance use:  Never Used Psych involvement (Current and /or in the community):  No (Comment)  Discharge Needs  Concerns to be addressed:  Care Coordination Readmission within the last 30 days:  No Current discharge risk:  None Barriers to Discharge:  Continued Medical Work up   [name], LCSW 12/03/2018, 2:56 PM

## 2018-12-03 NOTE — Progress Notes (Addendum)
    Durable Medical Equipment  (From admission, onward)         Start     Ordered   12/03/18 1333  For home use only DME Other see comment  Once    Comments:  [place] lift with pad   12/03/18 1333   12/03/18 1327  For home use only DME lightweight manual wheelchair with seat cushion  Once    Comments:  Patient suffers from weakness with COPD which impairs their ability to perform daily activities like walking in the home.  A walker will not resolve issue with performing activities of daily living. A wheelchair will allow patient to safely perform daily activities. Patient is not able to propel themselves in the home using a standard weight wheelchair due to weakness Patient can self propel in a lightweight wheelchair.  Accessories: elevating leg rests (ELRs), wheel locks, extensions and anti-tippers.   12/03/18 [place], PT, DPT Acute Rehabilitation Services  Pager [phone] Office [phone]

## 2018-12-03 NOTE — Care Management Note (Addendum)
Case Management Note  Patient Details  Name: Jeffrey [name] MRN: [id number] Date of Birth: 02/15/1941  Subjective/Objective:  COPD                 Action/Plan: Patient lives at home with spouse; PCP: [name], Dianna Rossetti, MD; has private insurance with [facility] with prescription drug coverage; [facility]; DME - home oxygen through [place]; [place] choice offered, pt/ family chose [facility]; Dan with [facility] called for arrangements.DME ordered as requested - wheelchair, lift chair with pad. CM will continue to follow patient for progression of care.  Expected Discharge Date:    possibly 12/04/2018              Expected Discharge Plan:  [place]  In-House Referral:   SW, transportation issues  Discharge planning Services  CM Consult  HH Arranged:  PT, OT, Nurse's Aide [facility] Agency:  [facility] Status of Service:  In process, will continue to follow  Sherrilyn Rist [phone] 12/03/2018, 1:17 PM

## 2018-12-03 NOTE — Progress Notes (Signed)
PT Cancellation Note  Patient Details Name: Jeffrey [name] MRN: [id number] DOB: 1941/08/26   Cancelled Treatment:    Reason Eval/Treat Not Completed: Patient at procedure or test/unavailable (HD). Will follow-up for PT evaluation as schedule permits.  Mabeline Caras, PT, DPT Acute Rehabilitation Services  Pager [phone] Office [place] 12/03/2018, 10:02 AM

## 2018-12-04 LAB — GLUCOSE, CAPILLARY
Glucose-Capillary: 321 mg/dL — ABNORMAL HIGH (ref 70–99)
Glucose-Capillary: 296 mg/dL — ABNORMAL HIGH (ref 70–99)
Glucose-Capillary: 364 mg/dL — ABNORMAL HIGH (ref 70–99)
Glucose-Capillary: 467 mg/dL — ABNORMAL HIGH (ref 70–99)
Glucose-Capillary: 531 mg/dL (ref 70–99)

## 2018-12-04 MED ORDER — INSULIN ASPART 100 UNIT/ML ~~LOC~~ SOLN
15.0000 [IU] | Freq: Once | SUBCUTANEOUS | Status: AC
  Administered 2018-12-04: 15 [IU] via SUBCUTANEOUS

## 2018-12-04 MED ORDER — CHLORHEXIDINE GLUCONATE CLOTH 2 % EX PADS: 6.0000 | MEDICATED_PAD | Freq: Every day | CUTANEOUS | Status: DC

## 2018-12-04 MED ORDER — INSULIN ASPART 100 UNIT/ML ~~LOC~~ SOLN
0.0000 [IU] | Freq: Three times a day (TID) | SUBCUTANEOUS | Status: DC
  Administered 2018-12-04: 7 [IU] via SUBCUTANEOUS
  Administered 2018-12-04: 9 [IU] via SUBCUTANEOUS
  Administered 2018-12-04: 5 [IU] via SUBCUTANEOUS
  Administered 2018-12-05: 3 [IU] via SUBCUTANEOUS

## 2018-12-04 NOTE — Evaluation (Signed)
Clinical/Bedside Swallow Evaluation Patient Details  Name: Jeffrey [name] MRN: [id number] Date of Birth: 03-10-1941  Today's Date: 12/04/2018 Time: SLP Start Time (ACUTE ONLY): 84 SLP Stop Time (ACUTE ONLY): 1100 SLP Time Calculation (min) (ACUTE ONLY): 40 min  Past Medical History:  Past Medical History:  Diagnosis Date  . Anginal pain ([place])   . Aortic stenosis    a. severe by echo 09/2014  . Atrial fibrillation ([place])    a. not well documented, not on anticoagulation  . CHF (congestive heart failure) ([place])    04/28/17 echo-EF 40%, mod LVH, diastolic dysfunction  . Claustrophobia   . Complete heart block ([place])   . COPD (chronic obstructive pulmonary disease) ([place])   . Coronary artery disease    a. chronically occluded RCA per cath 09/2014 with collaterals B. cath 05/01/17 chr occ RCA w/collaterals, 60-70% mid LAD,   . CVA (cerebral vascular accident) ([place]) 10/2014   denies residual on 07/11/2015  . ESRD (end stage renal disease) on dialysis [facility])    a. on dialysis; [facility]; MWF, LUE fistula (07/11/2015)  . History of blood transfusion    "related to gallbladder OR"  . History of stomach ulcers   . Hyperlipidemia   . Hypertension   . Iron deficiency anemia   . Myocardial infarction ([place]) 10/2014  . Peripheral vascular disease ([place])   . Pneumonia   . Presence of permanent cardiac pacemaker   . 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 Type II diabetes mellitus ([place])    Past Surgical History:  Past Surgical History:  Procedure Laterality Date  . AV FISTULA PLACEMENT Left 10/19/2014   Procedure: BRACHIOCEPHALIC ARTERIOVENOUS (AV) FISTULA CREATION ;  Surgeon: Conrad Capon Bridge, MD;  Location: [facility];  Service: Vascular;  Laterality: Left;  . CARDIAC CATHETERIZATION    . CARDIAC CATHETERIZATION N/A 07/22/2015   Procedure: Right/Left Heart Cath and Coronary  Angiography;  Surgeon: [name], MD;  Location: [facility] CV LAB;  Service: Cardiovascular;  Laterality: N/A;  . CATARACT EXTRACTION W/ INTRAOCULAR LENS  IMPLANT, BILATERAL Bilateral 1990's  . CHOLECYSTECTOMY OPEN  1980's  . COLONOSCOPY W/ BIOPSIES AND POLYPECTOMY    . CORONARY ANGIOGRAPHY N/A 07/31/2018   Procedure: CORONARY ANGIOGRAPHY;  Surgeon: Martinique, Peter M, MD;  Location: [place] CV LAB;  Service: Cardiovascular;  Laterality: N/A;  . CORONARY ANGIOPLASTY    . CORONARY ATHERECTOMY N/A 04/18/2018   Procedure: CORONARY ATHERECTOMY;  Surgeon: [name], MD;  Location: [facility] CV LAB;  Service: Cardiovascular;  Laterality: N/A;  . CORONARY BALLOON ANGIOPLASTY N/A 07/31/2018   Procedure: CORONARY BALLOON ANGIOPLASTY;  Surgeon: Martinique, Peter M, MD;  Location: [facility] CV LAB;  Service: Cardiovascular;  Laterality: N/A;  . CORONARY STENT INTERVENTION N/A 04/18/2018   Procedure: CORONARY STENT INTERVENTION;  Surgeon: [name], MD;  Location: [place] CV LAB;  Service: Cardiovascular;  Laterality: N/A;  . EP IMPLANTABLE DEVICE N/A 07/11/2015   Procedure: Pacemaker Implant;  Surgeon: [name], MD;  Location: [facility] CV LAB;  Service: Cardiovascular;  Laterality: N/A;  . ESOPHAGOGASTRODUODENOSCOPY  08/01/2012   Procedure: ESOPHAGOGASTRODUODENOSCOPY (EGD);  Surgeon: [name], MD;  Location[facility];  Service: Endoscopy;  Laterality: N/A;  . INSERT / REPLACE / REMOVE PACEMAKER  07/11/2015  . INSERTION OF DIALYSIS CATHETER Right 02/02/2015   Procedure: INSERTION OF DIALYSIS CATHETER  RIGHT  INTERNAL JUGULAR;  Surgeon: Mal [name], MD;  Location: [place];  Service: Vascular;  Laterality: Right;  . LEFT AND RIGHT HEART CATHETERIZATION WITH CORONARY ANGIOGRAM N/A 09/30/2014   Procedure: LEFT AND RIGHT HEART CATHETERIZATION WITH CORONARY ANGIOGRAM;  Surgeon: [name], MD;  Location: [facility] CATH LAB;  Service: Cardiovascular;   Laterality: N/A;  . LEFT HEART CATH AND CORONARY ANGIOGRAPHY N/A 04/14/2018   Procedure: LEFT HEART CATH AND CORONARY ANGIOGRAPHY;  Surgeon: [name], MD;  Location: [place] CV LAB;  Service: Cardiovascular;  Laterality: N/A;  . TEE WITHOUT CARDIOVERSION N/A 08/02/2015   Procedure: TRANSESOPHAGEAL ECHOCARDIOGRAM (TEE);  Surgeon: [name], MD;  Location: [facility];  Service: Open Heart Surgery;  Laterality: N/A;  . TONSILLECTOMY    . TRANSCATHETER AORTIC VALVE REPLACEMENT, TRANSFEMORAL Left 08/02/2015   Procedure: TRANSCATHETER AORTIC VALVE REPLACEMENT, TRANSFEMORAL;  Surgeon: [name], MD;  Location: [facility];  Service: Open Heart Surgery;  Laterality: Left;   HPI:  78 year old male admitted 11/29/18 with cough and fever. PMH: HTN, HLD, CAD, combined systolic and diastolic CHF, severe AS s/p TAVR, AFib not on anticoagulation, NSTEMI, PVD, s/p PM, ESRD on HD(M/W/F), DM type II, COPD on home oxygen, and dysphagia. Respiratory panel positive for RSV.    Assessment / Plan / Recommendation Clinical Impression  Pt known to speech therapy services, having undergone MBS in November 2019 (results indicated functional oropharyngeal swallow). Pt presents with intermittent congested, nonproductive cough even prior to po trials. Pt accepted trials of thin liquid, puree, and solid consistencies, and reports being on a regular diet at home. Wife reports pt "gets choked" occasionally when eating. On further questioning, pt confirms the same.   At this point, the cough is present with or without po presentation, he has a history of esophageal dysmotility, and a diagnosis of COPD. Repeat of instrumental examination is recommended to reassess oropharyngeal swallow function and safety. Pt agreed only after wife's encouragement, but stated he did not want to do it today. Will schedule MBS for tomorrow. RN and MD informed.   SLP Visit Diagnosis: Dysphagia, unspecified (R13.10)    Aspiration Risk  Mild  aspiration risk;Moderate aspiration risk    Diet Recommendation Regular;Thin liquid   Liquid Administration via: Cup;Straw Medication Administration: (as tolerated) Supervision: Patient able to self feed Compensations: Minimize environmental distractions;Slow rate;Small sips/bites Postural Changes: Seated upright at 90 degrees;Remain upright for at least 30 minutes after po intake    Other  Recommendations Oral Care Recommendations: Oral care BID   Follow up Recommendations  pending MBS         Prognosis Prognosis for Safe Diet Advancement: Fair      Swallow Study   General Date of Onset: 11/29/18 HPI: 78 year old male admitted 11/29/18 with cough and fever. PMH: HTN, HLD, CAD, combined systolic and diastolic CHF, severe AS s/p TAVR, AFib not on anticoagulation, NSTEMI, PVD, s/p PM, ESRD on HD(M/W/F), DM type II, COPD on home oxygen, and dysphagia. Respiratory panel positive for RSV.  Type of Study: Bedside Swallow Evaluation Previous Swallow Assessment: MBS 10/02/18 - functional swallow. Esophageal stasis. BaSW 10/16/18 - nonspecific dysmotility. Barium table sticks in mid and distal esophagus Diet Prior to this Study: Regular;Thin liquids(renal restrictions) Temperature Spikes Noted: No Respiratory Status: Nasal cannula History of Recent Intubation: No Behavior/Cognition: Alert;Cooperative;Pleasant mood Oral Cavity Assessment: Within Functional Limits Oral Care Completed by SLP: No Oral Cavity - Dentition: Edentulous Vision: Functional for self-feeding Self-Feeding Abilities: Able to feed self Patient  Positioning: Upright in bed Baseline Vocal Quality: Normal Volitional Cough: Congested(nonproductive) Volitional Swallow: Able to elicit    Oral/Motor/Sensory Function Overall Oral Motor/Sensory Function: Within functional limits   Ice Chips Ice chips: Not tested   Thin Liquid Thin Liquid: Within functional limits Presentation: Straw    Nectar Thick Nectar Thick Liquid: Not  tested   Honey Thick Honey Thick Liquid: Not tested   Puree Puree: Within functional limits Presentation: Self Fed;Spoon   Solid     Solid: Within functional limits Presentation: Cora B. Quentin Ore [facility], [place] Speech Language Pathologist [phone]  Shonna Chock 12/04/2018,11:07 AM

## 2018-12-04 NOTE — Progress Notes (Signed)
RT states they will bring pt a flutter valve.

## 2018-12-04 NOTE — Progress Notes (Signed)
Inpatient Diabetes Program Recommendations  AACE/ADA: New Consensus Statement on Inpatient Glycemic Control (2015)  Target Ranges:  Prepandial:   less than 140 mg/dL      Peak postprandial:   less than 180 mg/dL (1-2 hours)      Critically ill patients:  140 - 180 mg/dL   Lab Results  Component Value Date   GLUCAP 296 (H) 12/04/2018   HGBA1C 7.4 (H) 04/14/2018    Review of Glycemic Control Results for Jeffrey [name], Jeffrey [name] (MRN [id number]) as of 12/04/2018 14:04  Ref. Range 12/03/2018 13:36 12/03/2018 17:23 12/03/2018 21:06 12/04/2018 07:42 12/04/2018 11:33  Glucose-Capillary Latest Ref Range: 70 - 99 mg/dL 174 (H) 230 (H) 189 (H) 321 (H) 296 (H)   Diabetes history: DM2 Outpatient Diabetes medications:Levemir 50 units Current orders for Inpatient glycemic control: Levemir 50 units + Novolog sensitive tid + hs 0-5 units  Inpatient Diabetes Program Recommendations:   While on steroids and decrease when steroids decrease: -Increase Novolog correction to moderate tid + hs -Novolog 3 units tid meal coverage if eats 50%  Thank you, Bethena Roys E. Fredrica Capano, RN, MSN, CDE  Diabetes Coordinator Inpatient Glycemic Control Team Team Pager [phone] (8am-5pm) 12/04/2018 2:07 PM

## 2018-12-04 NOTE — Clinical Social Work Note (Signed)
Met with patient, wife, and son to discuss SNF placement. Patient stated his niece just got out of a SNF and had a tough time. Patient stated if he's only going to get an hour of therapy per day, he would prefer to do it at home. RNCM aware. Patient reports panic attacks that have just started occurring since admission.  CSW signing off. Consult again if any other social work needs arise.  Dayton Scrape, [place]

## 2018-12-04 NOTE — Progress Notes (Signed)
PROGRESS NOTE    Jeffrey [name]  YQI:[id number] DOB: 06/13/1941 DOA: 11/29/2018 PCP: Thurman Coyer, MD   Brief Narrative: Jeffrey [name] is a 78 y.o. male with history of CAD, combined systolic and diastolic heart failure, aortic stenosis status post TAVR, atrial fibrillation, peripheral vascular disease, ESRD on HD, diabetes mellitus, COPD with chronic respiratory failure on home oxygen.  He presented secondary to cough with fever with concern for pneumonia.  He was on empiric antibiotics with a positive respiratory virus panel for RSV.  Antibiotics were narrowed to accommodate for associated COPD exacerbation.  Improving slowly.   Assessment & Plan:   Active Problems:   Type 2 diabetes mellitus with chronic kidney disease on chronic dialysis, with long-term current use of insulin (HCC)   CAD (coronary artery disease)   Essential hypertension   ESRD (end stage renal disease) on dialysis [facility])   Atrial fibrillation (HCC) [I48.91]   Chest pain   Chronic respiratory failure with hypoxia (HCC)   COPD exacerbation (HCC)   RSV (respiratory syncytial virus infection)   Troponin level elevated   RSV bronchitis Antibiotics discontinued. Supportive care. Improving but not at baseline. -Mucinex -Flutter valve -Continue Duoneb prn -PT recommendations: SNF (patient declining, so will arrange home health)  COPD exacerbation Improving but not at baseline. -Continue solumedrol daily -Continue Brovana, Duoneb, Pulmicort -Continue Azithromycin  Chronic respiratory failure with hypoxia -Continue 3L via [place]  Dysphagia Question if patient is aspirating. RVP positive for RSV so doubting aspiration bacterial pneumonia. Dysphagia is contributing to patient's increased fluid intake, however, which put patient above dry weight for ESRD. -SLP evaluation: planning MBS  CAD Stable. -Continue metoprolol, plavix and Lipitor  Permanent atrial fibrillation Rate controlled. -Continue Eliquis  and metoprolol  Aortic stenosis s/p TAVR Stable  ESRD on HD Nephrology following  Anemia of chronic disease In setting of kidney disease  Diabetes mellitus, type 2 -Continue Levemir and SSI  Right shoulder weakness Associated discomfort. Shoulder x-ray shows rotator cuff injury. -OT consult  DVT prophylaxis: Eliquis Code Status:   Code Status: Full Code Family Communication: Wife, son at bedside Disposition Plan: Discharge home likely in 24 hours after HD and improvement closer to baseline.   Consultants:   Nephrology  Procedures:   HD  Antimicrobials:  Ceftriaxone  Azithromycin    Subjective: Breathing better but not at baseline.  Objective: Vitals:   12/04/18 0513 12/04/18 0842 12/04/18 0909 12/04/18 1428  BP: 131/64  138/62   Pulse: 60  62   Resp: 20     Temp: 97.7 F (36.5 C)     TempSrc: Oral     SpO2: 94% 94%  95%  Weight: 104.5 kg     Height:        Intake/Output Summary (Last 24 hours) at 12/04/2018 1613 Last data filed at 12/04/2018 0927 Gross per 24 hour  Intake 240 ml  Output 0 ml  Net 240 ml   Filed Weights   12/03/18 0715 12/03/18 1121 12/04/18 0513  Weight: 108.2 kg 104 kg 104.5 kg    Examination:  General exam: Appears calm and comfortable Respiratory system: Diminished breath sounds. Respiratory effort normal. Cardiovascular system: S1 & S2 heard, RRR. No murmurs, rubs, gallops or clicks. Gastrointestinal system: Abdomen is nondistended, soft and nontender. No organomegaly or masses felt. Normal bowel sounds heard. Central nervous system: Alert and oriented. No focal neurological deficits. Extremities: No edema. No calf tenderness Skin: No cyanosis. No rashes Psychiatry: Judgement and insight appear normal. Mood &  affect appropriate.     Data Reviewed: I have personally reviewed following labs and imaging studies  CBC: Recent Labs  Lab 11/29/18 1854 11/30/18 0331 12/01/18 1316 12/02/18 0455 12/03/18 0715  WBC 6.2  5.7 11.7* 9.8 9.5  NEUTROABS 5.6  --   --  8.8*  --   HGB 9.2* 9.0* 8.9* 9.3* 9.2*  HCT 31.4* 31.2* 28.8* 30.8* 30.4*  MCV 90.2 91.5 89.2 87.7 89.4  PLT 315 303 358 322 517   Basic Metabolic Panel: Recent Labs  Lab 11/29/18 1854 11/30/18 0331 12/01/18 1316 12/02/18 0455 12/03/18 0715  NA 131* 133* 128* 131* 131*  K 5.6* 5.3* 5.7* 4.8 4.4  CL 95* 95* 91* 92* 94*  CO2 25 22 22 27 26   GLUCOSE 118* 363* 169* 225* 303*  BUN 26* 36* 64* 36* 35*  CREATININE 4.12* 4.94* 5.93* 3.78* 3.06*  CALCIUM 9.6 9.8 9.2 8.7* 8.8*  MG  --   --   --  2.1  --   PHOS  --  5.7* 5.5*  --  3.9   GFR: Estimated Creatinine Clearance: 24.1 mL/min (A) (by C-G formula based on SCr of 3.06 mg/dL (H)). Liver Function Tests: Recent Labs  Lab 11/30/18 0331 12/01/18 1316 12/03/18 0715  ALBUMIN 2.4* 2.5* 2.3*   No results for input(s): LIPASE, AMYLASE in the last 168 hours. No results for input(s): AMMONIA in the last 168 hours. Coagulation Profile: No results for input(s): INR, PROTIME in the last 168 hours. Cardiac Enzymes: Recent Labs  Lab 11/29/18 1854 11/29/18 2301 11/30/18 0331 11/30/18 1118  TROPONINI 0.24* 0.20* 0.16* 1.15*   BNP (last 3 results) No results for input(s): PROBNP in the last 8760 hours. HbA1C: No results for input(s): HGBA1C in the last 72 hours. CBG: Recent Labs  Lab 12/03/18 1336 12/03/18 1723 12/03/18 2106 12/04/18 0742 12/04/18 1133  GLUCAP 174* 230* 189* 321* 296*   Lipid Profile: No results for input(s): CHOL, HDL, LDLCALC, TRIG, CHOLHDL, LDLDIRECT in the last 72 hours. Thyroid Function Tests: No results for input(s): TSH, T4TOTAL, FREET4, T3FREE, THYROIDAB in the last 72 hours. Anemia Panel: No results for input(s): VITAMINB12, FOLATE, FERRITIN, TIBC, IRON, RETICCTPCT in the last 72 hours. Sepsis Labs: Recent Labs  Lab 11/29/18 1908 11/30/18 0037  PROCALCITON  --  0.54  LATICACIDVEN 1.05  --     Recent Results (from the past 240 hour(s))    Respiratory Panel by PCR     Status: Abnormal   Collection Time: 11/29/18  6:54 PM  Result Value Ref Range Status   Adenovirus NOT DETECTED NOT DETECTED Final   Coronavirus 229E NOT DETECTED NOT DETECTED Final   Coronavirus HKU1 NOT DETECTED NOT DETECTED Final   Coronavirus NL63 NOT DETECTED NOT DETECTED Final   Coronavirus OC43 NOT DETECTED NOT DETECTED Final   Metapneumovirus NOT DETECTED NOT DETECTED Final   Rhinovirus / Enterovirus NOT DETECTED NOT DETECTED Final   Influenza A NOT DETECTED NOT DETECTED Final   Influenza B NOT DETECTED NOT DETECTED Final   Parainfluenza Virus 1 NOT DETECTED NOT DETECTED Final   Parainfluenza Virus 2 NOT DETECTED NOT DETECTED Final   Parainfluenza Virus 3 NOT DETECTED NOT DETECTED Final   Parainfluenza Virus 4 NOT DETECTED NOT DETECTED Final   Respiratory Syncytial Virus DETECTED (A) NOT DETECTED Final    Comment: CRITICAL RESULT CALLED TO, READ BACK BY AND VERIFIED WITH: M. TOBIAS,RN 2326 11/29/2018 T. TYSOR    Bordetella pertussis NOT DETECTED NOT DETECTED Final   Chlamydophila  pneumoniae NOT DETECTED NOT DETECTED Final   Mycoplasma pneumoniae NOT DETECTED NOT DETECTED Final  Culture, blood (routine x 2) Call MD if unable to obtain prior to antibiotics being given     Status: None (Preliminary result)   Collection Time: 11/30/18 12:37 AM  Result Value Ref Range Status   Specimen Description BLOOD RIGHT WRIST  Final   Special Requests   Final    BOTTLES DRAWN AEROBIC AND ANAEROBIC Blood Culture results may not be optimal due to an inadequate volume of blood received in culture bottles   Culture   Final    NO GROWTH 4 DAYS Performed at [facility] [address], [place], [place] [address]    Report Status PENDING  Incomplete  Culture, blood (routine x 2) Call MD if unable to obtain prior to antibiotics being given     Status: None (Preliminary result)   Collection Time: 11/30/18 12:40 AM  Result Value Ref Range Status   Specimen  Description BLOOD RIGHT HAND  Final   Special Requests   Final    BOTTLES DRAWN AEROBIC AND ANAEROBIC Blood Culture results may not be optimal due to an inadequate volume of blood received in culture bottles   Culture   Final    NO GROWTH 4 DAYS Performed at [facility], [place] [address] [place], [place] [address]    Report Status PENDING  Incomplete  Culture, sputum-assessment     Status: None   Collection Time: 11/30/18 11:18 AM  Result Value Ref Range Status   Specimen Description EXPECTORATED SPUTUM  Final   Special Requests NONE  Final   Sputum evaluation   Final    Sputum specimen not acceptable for testing.  Please recollect.   CRITICAL RESULT CALLED TO, READ BACK BY AND VERIFIED WITH: IDI RN AT [id number] ON [id number] BY SJW Performed at [facility], [place] [address], [place], [place] [address]    Report Status 11/30/2018 FINAL  Final  MRSA PCR Screening     Status: None   Collection Time: 11/30/18  3:40 PM  Result Value Ref Range Status   MRSA by PCR NEGATIVE NEGATIVE Final    Comment:        The GeneXpert MRSA Assay (FDA approved for NASAL specimens only), is one component of a comprehensive MRSA colonization surveillance program. It is not intended to diagnose MRSA infection nor to guide or monitor treatment for MRSA infections. Performed at [facility], [place] [address] [place], [place] [address]          Radiology Studies: Dg Shoulder Right  Result Date: 12/03/2018 CLINICAL DATA:  Shoulder pain EXAM: RIGHT SHOULDER - 2+ VIEW COMPARISON:  09/02/2009 FINDINGS: Chronic widening of the [facility] joint. High-riding appearance of the humeral head with decreased subacromial space. Calcific tendinitis. No fracture or dislocation. Right-sided pacing generator IMPRESSION: 1. No acute osseous abnormality 2. High-riding right humeral head suggesting rotator cuff disease. Small amount of calcific tendinitis at the humeral head Electronically Signed   By: Donavan Foil M.D.    On: 12/03/2018 17:37        Scheduled Meds: . apixaban  5 mg Oral BID  . arformoterol  15 mcg Nebulization BID  . atorvastatin  80 mg Oral Daily  . azithromycin  500 mg Oral Daily  . budesonide (PULMICORT) nebulizer solution  0.5 mg Nebulization BID  . [START ON 12/05/2018] Chlorhexidine Gluconate Cloth  6 each Topical Q0600  . cinacalcet  30 mg Oral Q supper  . clopidogrel  75 mg Oral Daily  . doxercalciferol  2 mcg Intravenous Q M,W,F-HD  . feeding supplement (PRO-STAT SUGAR FREE 64)  30 mL Oral BID  . guaiFENesin  600 mg Oral BID  . insulin aspart  0-5 Units Subcutaneous QHS  . insulin aspart  0-9 Units Subcutaneous TID WC  . insulin detemir  50 Units Subcutaneous QHS  . ipratropium-albuterol  3 mL Nebulization TID  . methylPREDNISolone (SOLU-MEDROL) injection  40 mg Intravenous Daily  . metoprolol tartrate  12.5 mg Oral BID  . pantoprazole  40 mg Oral Daily  . sevelamer carbonate  4,000 mg Oral TID WC   Continuous Infusions:   LOS: 5 days     Cordelia Poche, MD [facility] 12/04/2018, 4:13 PM  If 7PM-7AM, please contact night-coverage [email or web address]

## 2018-12-04 NOTE — Progress Notes (Signed)
RN and CNA placed pt in bedside chair per pt request using Maximove per PT recommendation.

## 2018-12-04 NOTE — Progress Notes (Addendum)
[facility] Progress Note  Subjective: 3 L off yest and on HD now, still over edw  Vitals:   12/03/18 2048 12/04/18 0513 12/04/18 0842 12/04/18 0909  BP:  131/64  138/62  Pulse: 68 60  62  Resp:  20    Temp:  97.7 F (36.5 C)    TempSrc:  Oral    SpO2:  94% 94%   Weight:  104.5 kg    Height:        Inpatient medications: . apixaban  5 mg Oral BID  . arformoterol  15 mcg Nebulization BID  . atorvastatin  80 mg Oral Daily  . azithromycin  500 mg Oral Daily  . budesonide (PULMICORT) nebulizer solution  0.5 mg Nebulization BID  . cinacalcet  30 mg Oral Q supper  . clopidogrel  75 mg Oral Daily  . doxercalciferol  2 mcg Intravenous Q M,W,F-HD  . feeding supplement (PRO-STAT SUGAR FREE 64)  30 mL Oral BID  . guaiFENesin  600 mg Oral BID  . insulin aspart  0-5 Units Subcutaneous QHS  . insulin aspart  0-9 Units Subcutaneous TID WC  . insulin detemir  50 Units Subcutaneous QHS  . ipratropium-albuterol  3 mL Nebulization TID  . methylPREDNISolone (SOLU-MEDROL) injection  40 mg Intravenous Daily  . metoprolol tartrate  12.5 mg Oral BID  . pantoprazole  40 mg Oral Daily  . sevelamer carbonate  4,000 mg Oral TID WC    acetaminophen **OR** acetaminophen, hydrOXYzine, ipratropium-albuterol, lidocaine-prilocaine, Melatonin, nitroGLYCERIN, ondansetron **OR** ondansetron (ZOFRAN) IV, sevelamer carbonate  Iron/TIBC/Ferritin/ %Sat    Component Value Date/Time   IRON 59 01/12/2015 0730   TIBC 227 01/12/2015 0730   FERRITIN 189 01/12/2015 0730   IRONPCTSAT 26 01/12/2015 0730    Exam: General: WDWN, NAD, chronically ill appearing, elderly male Head: NCAT sclera not icteric MMM Neck: Supple. No lymphadenopathy Lungs: BS decreased, clear, no rales or wheezing Heart: RRR. +7/7 systolic murmur, no rubs or gallops.  Abdomen: soft, nontender, +BS, no guarding, no rebound tenderness  Ext: edema improving , 1+ now Neuro: AAOx3. Moves all extremities spontaneously. Psych:   Responds to questions appropriately with a normal affect. Dialysis Access: LU AVF +b/t  Dialysis Orders:  MWF NW  4h  400/800   105kg   2K/3.5 bath  LUA AVF  Hep none  M225 every 2wks last 12/24  Hect 2 ug tiw   Assessment: 1. Dyspnea 2/2 vol^^/ RSV bronchitis/ COPD 2. ESRD HD MWF 3. HTN 4. 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 Chronic syst CHF 7. AS s/p TAVR 8. Atrial fib 9. CAD 10. Debility, not improved w/ UF   Plan: 1. At edw but still overloaded, will decrease vol w HD again tomorrow and lower dry wt if tolerates 2. SNF placement per PT rec    Kelly Splinter MD [facility] pager [phone]   12/04/2018, 12:59 PM   Recent Labs  Lab 12/01/18 1316 12/02/18 0455 12/03/18 0715  NA 128* 131* 131*  K 5.7* 4.8 4.4  CL 91* 92* 94*  CO2 22 27 26   GLUCOSE 169* 225* 303*  BUN 64* 36* 35*  CREATININE 5.93* 3.78* 3.06*  CALCIUM 9.2 8.7* 8.8*  PHOS 5.5*  --  3.9  ALBUMIN 2.5*  --  2.3*   No results for input(s): AST, ALT, ALKPHOS, BILITOT, PROT in the last 168 hours. Recent Labs  Lab 11/29/18 1854  12/02/18 0455 12/03/18 0715  WBC 6.2   < >  9.8 9.5  NEUTROABS 5.6  --  8.8*  --   HGB 9.2*   < > 9.3* 9.2*  HCT 31.4*   < > 30.8* 30.4*  MCV 90.2   < > 87.7 89.4  PLT 315   < > 322 329   < > = values in this interval not displayed.

## 2018-12-04 NOTE — Plan of Care (Signed)
  Problem: Education: Goal: Knowledge of General Education information will improve Description: Including pain rating scale, medication(s)/side effects and non-pharmacologic comfort measures Outcome: Progressing   Problem: Health Behavior/Discharge Planning: Goal: Ability to manage health-related needs will improve Outcome: Progressing   Problem: Clinical Measurements: Goal: Ability to maintain clinical measurements within normal limits will improve Outcome: Progressing   Problem: Activity: Goal: Risk for activity intolerance will decrease Outcome: Progressing   Problem: Nutrition: Goal: Adequate nutrition will be maintained Outcome: Progressing   Problem: Safety: Goal: Ability to remain free from injury will improve Outcome: Progressing   Problem: Skin Integrity: Goal: Risk for impaired skin integrity will decrease Outcome: Progressing   

## 2018-12-05 ENCOUNTER — Inpatient Hospital Stay (HOSPITAL_COMMUNITY): Payer: Medicare Other

## 2018-12-05 DIAGNOSIS — M79605 Pain in left leg: Secondary | ICD-10-CM

## 2018-12-05 DIAGNOSIS — M79604 Pain in right leg: Secondary | ICD-10-CM

## 2018-12-05 LAB — GLUCOSE, CAPILLARY
GLUCOSE-CAPILLARY: 314 mg/dL — AB (ref 70–99)
Glucose-Capillary: 438 mg/dL — ABNORMAL HIGH (ref 70–99)
Glucose-Capillary: 340 mg/dL — ABNORMAL HIGH (ref 70–99)
Glucose-Capillary: 114 mg/dL — ABNORMAL HIGH (ref 70–99)
Glucose-Capillary: 82 mg/dL (ref 70–99)
Glucose-Capillary: 221 mg/dL — ABNORMAL HIGH (ref 70–99)

## 2018-12-05 LAB — CULTURE, BLOOD (ROUTINE X 2)
Culture: NO GROWTH
Culture: NO GROWTH

## 2018-12-05 LAB — CBC
HCT: 31.2 % — ABNORMAL LOW (ref 39.0–52.0)
Hemoglobin: 9.6 g/dL — ABNORMAL LOW (ref 13.0–17.0)
MCH: 27 pg (ref 26.0–34.0)
MCHC: 30.8 g/dL (ref 30.0–36.0)
MCV: 87.6 fL (ref 80.0–100.0)
Platelets: 378 10*3/uL (ref 150–400)
RBC: 3.56 MIL/uL — ABNORMAL LOW (ref 4.22–5.81)
RDW: 17 % — ABNORMAL HIGH (ref 11.5–15.5)
WBC: 12.2 10*3/uL — ABNORMAL HIGH (ref 4.0–10.5)
nRBC: 0 % (ref 0.0–0.2)

## 2018-12-05 LAB — RENAL FUNCTION PANEL
Albumin: 2.5 g/dL — ABNORMAL LOW (ref 3.5–5.0)
Anion gap: 14 (ref 5–15)
BUN: 66 mg/dL — ABNORMAL HIGH (ref 8–23)
CO2: 23 mmol/L (ref 22–32)
Calcium: 8.7 mg/dL — ABNORMAL LOW (ref 8.9–10.3)
Chloride: 92 mmol/L — ABNORMAL LOW (ref 98–111)
Creatinine, Ser: 4.05 mg/dL — ABNORMAL HIGH (ref 0.61–1.24)
GFR calc Af Amer: 15 mL/min — ABNORMAL LOW (ref >60)
GFR calc non Af Amer: 13 mL/min — ABNORMAL LOW (ref >60)
Glucose, Bld: 268 mg/dL — ABNORMAL HIGH (ref 70–99)
POTASSIUM: 4 mmol/L (ref 3.5–5.1)
Phosphorus: 3.9 mg/dL (ref 2.5–4.6)
Sodium: 129 mmol/L — ABNORMAL LOW (ref 135–145)

## 2018-12-05 MED ORDER — SEVELAMER CARBONATE 2.4 G PO PACK
4.8000 g | PACK | Freq: Three times a day (TID) | ORAL | Status: DC
  Administered 2018-12-05: 4.8 g via ORAL
  Filled 2018-12-05 (×2): qty 2

## 2018-12-05 MED ORDER — SALINE SPRAY 0.65 % NA SOLN
1.0000 | NASAL | Status: DC | PRN
  Administered 2018-12-06: 1 via NASAL
  Filled 2018-12-05: qty 44

## 2018-12-05 MED ORDER — CHLORHEXIDINE GLUCONATE CLOTH 2 % EX PADS: 6.0000 | MEDICATED_PAD | Freq: Every day | CUTANEOUS | Status: DC

## 2018-12-05 MED ORDER — HYDROCODONE-ACETAMINOPHEN 5-325 MG PO TABS
1.0000 | ORAL_TABLET | Freq: Once | ORAL | Status: AC
  Administered 2018-12-05: 1 via ORAL
  Filled 2018-12-05: qty 1

## 2018-12-05 MED ORDER — DOXERCALCIFEROL 4 MCG/2ML IV SOLN
INTRAVENOUS | Status: AC
  Administered 2018-12-05: 2 ug via INTRAVENOUS
  Filled 2018-12-05: qty 2

## 2018-12-05 MED ORDER — ACETAMINOPHEN 325 MG PO TABS
ORAL_TABLET | ORAL | Status: AC
  Filled 2018-12-05: qty 2

## 2018-12-05 NOTE — Progress Notes (Signed)
PT Cancellation Note  Patient Details Name: Jeffrey [name] MRN: [id number] DOB: 1941/06/04   Cancelled Treatment:    Reason Eval/Treat Not Completed: Patient at procedure or test/unavailable (HD). Will follow-up for PT treatment as schedule permits.  Mabeline Caras, PT, DPT Acute Rehabilitation Services  Pager [phone] Office [place] 12/05/2018, 7:33 AM

## 2018-12-05 NOTE — Progress Notes (Signed)
Inpatient Diabetes Program Recommendations  AACE/ADA: New Consensus Statement on Inpatient Glycemic Control (2015)  Target Ranges:  Prepandial:   less than 140 mg/dL      Peak postprandial:   less than 180 mg/dL (1-2 hours)      Critically ill patients:  140 - 180 mg/dL    Results for Jeffrey [name], Jeffrey [name] (MRN [id number]) as of 12/05/2018 12:05  Ref. Range 12/04/2018 07:42 12/04/2018 11:33 12/04/2018 16:32 12/04/2018 21:33 12/04/2018 23:20 12/05/2018 01:06  Glucose-Capillary Latest Ref Range: 70 - 99 mg/dL 321 (H)  7 units NOVOLOG Given at 9am 296 (H)  5 units NOVOLOG  364 (H)  9 units NOVOLOG  467 (H) 531 (HH)  15 units NOVOLOG +  50 units LEVEMIR 438 (H)   Results for Jeffrey [name], Jeffrey [name] (MRN [id number]) as of 12/05/2018 12:05  Ref. Range 12/05/2018 02:18  Glucose-Capillary Latest Ref Range: 70 - 99 mg/dL 340 (H)    Home DM Meds: Levemir 50 units QHS  Current Orders: Levemir 50 units QHS      Novolog Sensitive Correction Scale/ SSI (0-9 units) TID AC + HS      Solumedrol reduced to 40 mg Daily yesterday (was 40 mg Q8 hours prior).  CBGs running very high likely due to the steroids.  Scheduled to get Dialysis today.     MD- Please consider the following in-hospital insulin adjustments if pt's CBGs continue to remain elevated while getting IV Steroids:  1. Increase Levemir to 55 units QHS (10% increase)  2. Start Novolog Meal Coverage: Novolog 4 units TID with meals   (Please add the following Hold Parameters: Hold if pt eats <50% of meal, Hold if pt NPO)     --Will follow patient during hospitalization--  Wyn Quaker RN, MSN, CDE Diabetes Coordinator Inpatient Glycemic Control Team Team Pager: [phone] (8a-5p)

## 2018-12-05 NOTE — Progress Notes (Addendum)
[facility] Progress Note  Subjective: on HD now, coughing, no new c/o  Vitals:   12/05/18 0930 12/05/18 1000 12/05/18 1030 12/05/18 1110  BP: (!) 98/51 (!) 93/49 (!) 106/49 (!) 121/51  Pulse: 61 61 (!) 56 (!) 55  Resp:    (!) 21  Temp:    (!) 97 F (36.1 C)  TempSrc:    Axillary  SpO2:    100%  Weight:    104.8 kg  Height:        Inpatient medications: . acetaminophen      . apixaban  5 mg Oral BID  . arformoterol  15 mcg Nebulization BID  . atorvastatin  80 mg Oral Daily  . budesonide (PULMICORT) nebulizer solution  0.5 mg Nebulization BID  . cinacalcet  30 mg Oral Q supper  . clopidogrel  75 mg Oral Daily  . doxercalciferol  2 mcg Intravenous Q M,W,F-HD  . feeding supplement (PRO-STAT SUGAR FREE 64)  30 mL Oral BID  . guaiFENesin  600 mg Oral BID  . insulin aspart  0-5 Units Subcutaneous QHS  . insulin aspart  0-9 Units Subcutaneous TID WC  . insulin detemir  50 Units Subcutaneous QHS  . ipratropium-albuterol  3 mL Nebulization TID  . methylPREDNISolone (SOLU-MEDROL) injection  40 mg Intravenous Daily  . metoprolol tartrate  12.5 mg Oral BID  . pantoprazole  40 mg Oral Daily  . sevelamer carbonate  4,000 mg Oral TID WC    acetaminophen **OR** acetaminophen, hydrOXYzine, ipratropium-albuterol, lidocaine-prilocaine, Melatonin, nitroGLYCERIN, ondansetron **OR** ondansetron (ZOFRAN) IV, sevelamer carbonate  Iron/TIBC/Ferritin/ %Sat    Component Value Date/Time   IRON 59 01/12/2015 0730   TIBC 227 01/12/2015 0730   FERRITIN 189 01/12/2015 0730   IRONPCTSAT 26 01/12/2015 0730    Exam: General: WDWN, NAD, chronically ill appearing, elderly male Head: NCAT sclera not icteric MMM Neck: Supple. No lymphadenopathy Lungs: BS decreased, clear, no rales or wheezing Heart: RRR. +9/3 systolic murmur, no rubs or gallops.  Abdomen: soft, nontender, +BS, no guarding, no rebound tenderness  Ext: edema improving , 1+ now Neuro: AAOx3. Moves all extremities  spontaneously. Psych:  Responds to questions appropriately with a normal affect. Dialysis Access: LU AVF +b/t  Dialysis Orders:  MWF NW  4h  400/800   105kg   2K/3.5 bath  LUA AVF  Hep none  M225 every 2wks last 12/24  Hect 2 ug tiw   Assessment: 1. RSV bronchitis 2. COPD not sure if on home O2 3. Vol excess: down to dry today still some edema 4. ESRD HD MWF 5. HTN bp's dropping w/ vol reduction 6. 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 Chronic syst CHF 9. AS s/p TAVR 10. Atrial fib 11. CAD 18. Debility   Plan: 1. HD today on schedule  2. Possible dc home soon    Kelly Splinter MD [place] pager [phone]   12/05/2018, 12:12 PM   Recent Labs  Lab 12/03/18 0715 12/05/18 0655  NA 131* 129*  K 4.4 4.0  CL 94* 92*  CO2 26 23  GLUCOSE 303* 268*  BUN 35* 66*  CREATININE 3.06* 4.05*  CALCIUM 8.8* 8.7*  PHOS 3.9 3.9  ALBUMIN 2.3* 2.5*   No results for input(s): AST, ALT, ALKPHOS, BILITOT, PROT in the last 168 hours. Recent Labs  Lab 11/29/18 1854  12/02/18 0455 12/03/18 0715 12/05/18 0655  WBC 6.2   < > 9.8 9.5 12.2*  NEUTROABS 5.6  --  8.8*  --   --   HGB 9.2*   < > 9.3* 9.2* 9.6*  HCT 31.4*   < > 30.8* 30.4* 31.2*  MCV 90.2   < > 87.7 89.4 87.6  PLT 315   < > 322 329 378   < > = values in this interval not displayed.

## 2018-12-05 NOTE — Progress Notes (Signed)
PROGRESS NOTE    Jeffrey [name]  TDD:[id number] DOB: 1941-02-11 DOA: 11/29/2018 PCP: Thurman Coyer, MD   Brief Narrative: Jeffrey [name] is a 78 y.o. male with history of CAD, combined systolic and diastolic heart failure, aortic stenosis status post TAVR, atrial fibrillation, peripheral vascular disease, ESRD on HD, diabetes mellitus, COPD with chronic respiratory failure on home oxygen.  He presented secondary to cough with fever with concern for pneumonia.  He was on empiric antibiotics with a positive respiratory virus panel for RSV.  Antibiotics were narrowed to accommodate for associated COPD exacerbation.  Improving slowly.   Assessment & Plan:   Active Problems:   Type 2 diabetes mellitus with chronic kidney disease on chronic dialysis, with long-term current use of insulin (HCC)   CAD (coronary artery disease)   Essential hypertension   ESRD (end stage renal disease) on dialysis [facility])   Atrial fibrillation (HCC) [I48.91]   Chest pain   Chronic respiratory failure with hypoxia (HCC)   COPD exacerbation (HCC)   RSV (respiratory syncytial virus infection)   Troponin level elevated   RSV bronchitis Antibiotics discontinued. Supportive care. Improved. -Mucinex -Flutter valve -Continue Duoneb prn -PT recommendations: SNF (patient declining, so will arrange home health)  COPD exacerbation Continues to improve. Significant sputum production today with some blood streaks. -Continue solumedrol daily -Continue Brovana, Duoneb, Pulmicort -Continue Azithromycin  Chronic respiratory failure with hypoxia -Continue 3L via [place]  Dysphagia Question if patient is aspirating. RVP positive for RSV so doubting aspiration bacterial pneumonia. Dysphagia is contributing to patient's increased fluid intake, however, which put patient above dry weight for ESRD. -SLP evaluation: planning MBS  CAD Stable. -Continue metoprolol, plavix and Lipitor  Permanent atrial fibrillation Rate  controlled. -Continue Eliquis and metoprolol  Aortic stenosis s/p TAVR Stable  ESRD on HD Nephrology following. Did not tolerate full HD today secondary to leg cramps -HD tomorrow prior to discharge.  Anemia of chronic disease In setting of kidney disease  Diabetes mellitus, type 2 -Continue Levemir and SSI  Right shoulder weakness Associated discomfort. Shoulder x-ray shows rotator cuff injury. -OT consult  Leg cramping Suspect claudication. -Will obtain ABI, but will need outpatient follow-up -Norco for pain  DVT prophylaxis: Eliquis Code Status:   Code Status: Full Code Family Communication: Wife, son at bedside Disposition Plan: Discharge home likely in 24 hours after HD tomorrow   Consultants:   Nephrology  Procedures:   HD  Antimicrobials:  Ceftriaxone  Azithromycin    Subjective: Severe leg cramping this afternoon after HD. He states this is intermittent and present as an outpatient as well. He takes medication at home which helps but does not know the name.  Objective: Vitals:   12/05/18 1000 12/05/18 1030 12/05/18 1110 12/05/18 1259  BP: (!) 93/49 (!) 106/49 (!) 121/51   Pulse: 61 (!) 56 (!) 55   Resp:   (!) 21   Temp:   (!) 97 F (36.1 C)   TempSrc:   Axillary   SpO2:   100% 99%  Weight:   104.8 kg   Height:        Intake/Output Summary (Last 24 hours) at 12/05/2018 1653 Last data filed at 12/05/2018 1058 Gross per 24 hour  Intake 240 ml  Output 2400 ml  Net -2160 ml   Filed Weights   12/04/18 0513 12/05/18 0639 12/05/18 1110  Weight: 104.5 kg 107.1 kg 104.8 kg    Examination:  General exam: Appears calm and comfortable Respiratory system: Diminished bilaterally with  rhonchi. Respiratory effort normal. Cardiovascular system: S1 & S2 heard, RRR. No murmurs, rubs, gallops or clicks. Cannot feel DP pulses bilaterally Gastrointestinal system: Abdomen is nondistended, soft and nontender. No organomegaly or masses felt. Normal bowel  sounds heard. Central nervous system: Alert and oriented. No focal neurological deficits. Extremities: 2+ LE pitting edema. No calf tenderness Skin: No cyanosis. No rashes Psychiatry: Judgement and insight appear normal. Mood & affect appropriate.    Data Reviewed: I have personally reviewed following labs and imaging studies  CBC: Recent Labs  Lab 11/29/18 1854 11/30/18 0331 12/01/18 1316 12/02/18 0455 12/03/18 0715 12/05/18 0655  WBC 6.2 5.7 11.7* 9.8 9.5 12.2*  NEUTROABS 5.6  --   --  8.8*  --   --   HGB 9.2* 9.0* 8.9* 9.3* 9.2* 9.6*  HCT 31.4* 31.2* 28.8* 30.8* 30.4* 31.2*  MCV 90.2 91.5 89.2 87.7 89.4 87.6  PLT 315 303 358 322 329 833   Basic Metabolic Panel: Recent Labs  Lab 11/30/18 0331 12/01/18 1316 12/02/18 0455 12/03/18 0715 12/05/18 0655  NA 133* 128* 131* 131* 129*  K 5.3* 5.7* 4.8 4.4 4.0  CL 95* 91* 92* 94* 92*  CO2 22 22 27 26 23   GLUCOSE 363* 169* 225* 303* 268*  BUN 36* 64* 36* 35* 66*  CREATININE 4.94* 5.93* 3.78* 3.06* 4.05*  CALCIUM 9.8 9.2 8.7* 8.8* 8.7*  MG  --   --  2.1  --   --   PHOS 5.7* 5.5*  --  3.9 3.9   GFR: Estimated Creatinine Clearance: 18.2 mL/min (A) (by C-G formula based on SCr of 4.05 mg/dL (H)). Liver Function Tests: Recent Labs  Lab 11/30/18 0331 12/01/18 1316 12/03/18 0715 12/05/18 0655  ALBUMIN 2.4* 2.5* 2.3* 2.5*   No results for input(s): LIPASE, AMYLASE in the last 168 hours. No results for input(s): AMMONIA in the last 168 hours. Coagulation Profile: No results for input(s): INR, PROTIME in the last 168 hours. Cardiac Enzymes: Recent Labs  Lab 11/29/18 1854 11/29/18 2301 11/30/18 0331 11/30/18 1118  TROPONINI 0.24* 0.20* 0.16* 1.15*   BNP (last 3 results) No results for input(s): PROBNP in the last 8760 hours. HbA1C: No results for input(s): HGBA1C in the last 72 hours. CBG: Recent Labs  Lab 12/04/18 2320 12/05/18 0106 12/05/18 0218 12/05/18 1146 12/05/18 1213  GLUCAP 531* 438* 340* 82 114*     Lipid Profile: No results for input(s): CHOL, HDL, LDLCALC, TRIG, CHOLHDL, LDLDIRECT in the last 72 hours. Thyroid Function Tests: No results for input(s): TSH, T4TOTAL, FREET4, T3FREE, THYROIDAB in the last 72 hours. Anemia Panel: No results for input(s): VITAMINB12, FOLATE, FERRITIN, TIBC, IRON, RETICCTPCT in the last 72 hours. Sepsis Labs: Recent Labs  Lab 11/29/18 1908 11/30/18 0037  PROCALCITON  --  0.54  LATICACIDVEN 1.05  --     Recent Results (from the past 240 hour(s))  Respiratory Panel by PCR     Status: Abnormal   Collection Time: 11/29/18  6:54 PM  Result Value Ref Range Status   Adenovirus NOT DETECTED NOT DETECTED Final   Coronavirus 229E NOT DETECTED NOT DETECTED Final   Coronavirus HKU1 NOT DETECTED NOT DETECTED Final   Coronavirus NL63 NOT DETECTED NOT DETECTED Final   Coronavirus OC43 NOT DETECTED NOT DETECTED Final   Metapneumovirus NOT DETECTED NOT DETECTED Final   Rhinovirus / Enterovirus NOT DETECTED NOT DETECTED Final   Influenza A NOT DETECTED NOT DETECTED Final   Influenza B NOT DETECTED NOT DETECTED Final   Parainfluenza  Virus 1 NOT DETECTED NOT DETECTED Final   Parainfluenza Virus 2 NOT DETECTED NOT DETECTED Final   Parainfluenza Virus 3 NOT DETECTED NOT DETECTED Final   Parainfluenza Virus 4 NOT DETECTED NOT DETECTED Final   Respiratory Syncytial Virus DETECTED (A) NOT DETECTED Final    Comment: CRITICAL RESULT CALLED TO, READ BACK BY AND VERIFIED WITH: M. TOBIAS,RN 2326 11/29/2018 T. TYSOR    Bordetella pertussis NOT DETECTED NOT DETECTED Final   Chlamydophila pneumoniae NOT DETECTED NOT DETECTED Final   Mycoplasma pneumoniae NOT DETECTED NOT DETECTED Final  Culture, blood (routine x 2) Call MD if unable to obtain prior to antibiotics being given     Status: None   Collection Time: 11/30/18 12:37 AM  Result Value Ref Range Status   Specimen Description BLOOD RIGHT WRIST  Final   Special Requests   Final    BOTTLES DRAWN AEROBIC AND  ANAEROBIC Blood Culture results may not be optimal due to an inadequate volume of blood received in culture bottles   Culture   Final    NO GROWTH 5 DAYS Performed at [facility], [place] [address] [place], [place] [address]    Report Status 12/05/2018 FINAL  Final  Culture, blood (routine x 2) Call MD if unable to obtain prior to antibiotics being given     Status: None   Collection Time: 11/30/18 12:40 AM  Result Value Ref Range Status   Specimen Description BLOOD RIGHT HAND  Final   Special Requests   Final    BOTTLES DRAWN AEROBIC AND ANAEROBIC Blood Culture results may not be optimal due to an inadequate volume of blood received in culture bottles   Culture   Final    NO GROWTH 5 DAYS Performed at [facility], [place] [address], [place], [place] [address]    Report Status 12/05/2018 FINAL  Final  Culture, sputum-assessment     Status: None   Collection Time: 11/30/18 11:18 AM  Result Value Ref Range Status   Specimen Description EXPECTORATED SPUTUM  Final   Special Requests NONE  Final   Sputum evaluation   Final    Sputum specimen not acceptable for testing.  Please recollect.   CRITICAL RESULT CALLED TO, READ BACK BY AND VERIFIED WITH: IDI RN AT 8101 ON [id number] BY SJW Performed at [facility], [place] [address], [place], [place] [address]    Report Status 11/30/2018 FINAL  Final  MRSA PCR Screening     Status: None   Collection Time: 11/30/18  3:40 PM  Result Value Ref Range Status   MRSA by PCR NEGATIVE NEGATIVE Final    Comment:        The GeneXpert MRSA Assay (FDA approved for NASAL specimens only), is one component of a comprehensive MRSA colonization surveillance program. It is not intended to diagnose MRSA infection nor to guide or monitor treatment for MRSA infections. Performed at [facility], [place] [address], [place], [place] [address]          Radiology Studies: Dg Shoulder Right  Result Date: 12/03/2018 CLINICAL DATA:  Shoulder  pain EXAM: RIGHT SHOULDER - 2+ VIEW COMPARISON:  09/02/2009 FINDINGS: Chronic widening of the [facility] joint. High-riding appearance of the humeral head with decreased subacromial space. Calcific tendinitis. No fracture or dislocation. Right-sided pacing generator IMPRESSION: 1. No acute osseous abnormality 2. High-riding right humeral head suggesting rotator cuff disease. Small amount of calcific tendinitis at the humeral head Electronically Signed  By: Donavan Foil M.D.   On: 12/03/2018 17:37        Scheduled Meds: . acetaminophen      . apixaban  5 mg Oral BID  . arformoterol  15 mcg Nebulization BID  . atorvastatin  80 mg Oral Daily  . budesonide (PULMICORT) nebulizer solution  0.5 mg Nebulization BID  . cinacalcet  30 mg Oral Q supper  . clopidogrel  75 mg Oral Daily  . doxercalciferol  2 mcg Intravenous Q M,W,F-HD  . feeding supplement (PRO-STAT SUGAR FREE 64)  30 mL Oral BID  . guaiFENesin  600 mg Oral BID  . insulin aspart  0-5 Units Subcutaneous QHS  . insulin aspart  0-9 Units Subcutaneous TID WC  . insulin detemir  50 Units Subcutaneous QHS  . ipratropium-albuterol  3 mL Nebulization TID  . methylPREDNISolone (SOLU-MEDROL) injection  40 mg Intravenous Daily  . metoprolol tartrate  12.5 mg Oral BID  . pantoprazole  40 mg Oral Daily  . sevelamer carbonate  4.8 g Oral TID WC   Continuous Infusions:   LOS: 6 days     Cordelia Poche, MD [facility] 12/05/2018, 4:53 PM  If 7PM-7AM, please contact night-coverage [email or web address]

## 2018-12-05 NOTE — Progress Notes (Signed)
CM talked to [facility] with Seth [facility]; his wheelchair and hoyar lift with sling will be delivered to the home today; Aneta Mins [phone]

## 2018-12-05 NOTE — Progress Notes (Signed)
Received call that patient is requesting a sliding board at discharge. Brad with [facility] called; Sliding Board will be delivered to the home. CM will continue to follow for progression of care. Mindi Slicker [facility] [phone]

## 2018-12-05 NOTE — Progress Notes (Signed)
Patient is experiencing crampy pain in legs which started in dialysis was reported that treat was stopped per patients request, Tylenol was given, with not relief, offered mustard and a warm compress. Wife at bedside and is concerned about swelling in legs Paged R.Nettey MD for advice.

## 2018-12-05 NOTE — Progress Notes (Signed)
Patient is alert and oriented with wife at bedside, gave a one-time analgesic per MD orders, Wife has expressed being overwhelmed. With patients illness. Asked for a sliding board to help patient to bathroom at home.

## 2018-12-05 NOTE — Progress Notes (Addendum)
OT Cancellation Note  Patient Details Name: Jeffrey [name] MRN: [id number] DOB: January 08, 1941   Cancelled Treatment:    Reason Eval/Treat Not Completed: Pain limiting ability to participate. Pt reports severe pain after HD today, RN aware  Britt Bottom 12/05/2018, 1:27 PM

## 2018-12-05 NOTE — Progress Notes (Signed)
SLP Cancellation Note  Patient Details Name: Jeffrey [name] MRN: [id number] DOB: 1941-07-01   Cancelled treatment:       Reason Eval/Treat Not Completed: Patient declined. Pt reportedly is having significant pain after dialysis today and declined MBS. Will f/u as able to complete swallow study.   [name] 12/05/2018, 2:30 PM   [name], M.A. [place] Acute Environmental education officer [phone] Office [phone]

## 2018-12-05 NOTE — Progress Notes (Addendum)
Pt states he is in severe pain with his legs and feet - RT notified RN that pt and family states that pt has severe pain after dialysis and he does have home regimen of pain medicine when this occurs. Neb tx done. Pt in no distress.

## 2018-12-06 ENCOUNTER — Encounter (HOSPITAL_COMMUNITY): Payer: Medicare Other

## 2018-12-06 ENCOUNTER — Inpatient Hospital Stay (HOSPITAL_COMMUNITY): Payer: Medicare Other

## 2018-12-06 DIAGNOSIS — M79609 Pain in unspecified limb: Secondary | ICD-10-CM

## 2018-12-06 LAB — GLUCOSE, CAPILLARY
Glucose-Capillary: 106 mg/dL — ABNORMAL HIGH (ref 70–99)
Glucose-Capillary: 101 mg/dL — ABNORMAL HIGH (ref 70–99)

## 2018-12-06 LAB — CBC
HCT: 30.8 % — ABNORMAL LOW (ref 39.0–52.0)
Hemoglobin: 9.3 g/dL — ABNORMAL LOW (ref 13.0–17.0)
MCH: 26.3 pg (ref 26.0–34.0)
MCHC: 30.2 g/dL (ref 30.0–36.0)
MCV: 87 fL (ref 80.0–100.0)
NRBC: 0 % (ref 0.0–0.2)
Platelets: 351 10*3/uL (ref 150–400)
RBC: 3.54 MIL/uL — ABNORMAL LOW (ref 4.22–5.81)
RDW: 16.9 % — ABNORMAL HIGH (ref 11.5–15.5)
WBC: 15.8 10*3/uL — ABNORMAL HIGH (ref 4.0–10.5)

## 2018-12-06 LAB — RENAL FUNCTION PANEL
Albumin: 2.3 g/dL — ABNORMAL LOW (ref 3.5–5.0)
Anion gap: 13 (ref 5–15)
BUN: 57 mg/dL — ABNORMAL HIGH (ref 8–23)
CO2: 24 mmol/L (ref 22–32)
Calcium: 8.3 mg/dL — ABNORMAL LOW (ref 8.9–10.3)
Chloride: 94 mmol/L — ABNORMAL LOW (ref 98–111)
Creatinine, Ser: 3.5 mg/dL — ABNORMAL HIGH (ref 0.61–1.24)
GFR calc Af Amer: 18 mL/min — ABNORMAL LOW (ref >60)
GFR calc non Af Amer: 16 mL/min — ABNORMAL LOW (ref >60)
Glucose, Bld: 194 mg/dL — ABNORMAL HIGH (ref 70–99)
POTASSIUM: 4.1 mmol/L (ref 3.5–5.1)
Phosphorus: 3.5 mg/dL (ref 2.5–4.6)
SODIUM: 131 mmol/L — AB (ref 135–145)

## 2018-12-06 MED ORDER — PRO-STAT SUGAR FREE PO LIQD: 30.0000 mL | Freq: Two times a day (BID) | ORAL | 0 refills | Status: DC

## 2018-12-06 MED ORDER — HYDROCODONE-ACETAMINOPHEN 5-325 MG PO TABS: ORAL_TABLET | ORAL | 0 refills | Status: DC

## 2018-12-06 MED ORDER — PREDNISONE 10 MG PO TABS: ORAL_TABLET | ORAL | 0 refills | Status: AC

## 2018-12-06 MED ORDER — GUAIFENESIN ER 600 MG PO TB12: 600.0000 mg | ORAL_TABLET | Freq: Two times a day (BID) | ORAL | 0 refills | Status: AC

## 2018-12-06 MED ORDER — SEVELAMER CARBONATE 2.4 G PO PACK
4.8000 g | PACK | Freq: Three times a day (TID) | ORAL | Status: DC
  Filled 2018-12-06 (×4): qty 2

## 2018-12-06 NOTE — Progress Notes (Signed)
[facility] transport arranged. [facility] will provide transportation to home. Time frame for pickup was not given.

## 2018-12-06 NOTE — Progress Notes (Signed)
Patient gone to [facility] for ABI [place]

## 2018-12-06 NOTE — Progress Notes (Signed)
Patient in HD during shift report.

## 2018-12-06 NOTE — Discharge Instructions (Signed)
Jeffrey [name],  You were in the hospital because of trouble breathing secondary to too much fluid. You received multiple rounds of HD which improved your breathing. You are also being treated for a COPD exacerbation. While you were here, you had significant leg pain with HD which is likely secondary to poor artery blood flow. Please follow-up with a vascular surgeon.

## 2018-12-06 NOTE — Discharge Summary (Signed)
Physician Discharge Summary  Jeffrey [name] VHQ:[id number] DOB: Jan 30, 1941 DOA: 11/29/2018  PCP: Thurman Coyer, MD  Admit date: 11/29/2018 Discharge date: 12/06/2018  Admitted From: Home Disposition: Home  Recommendations for Outpatient Follow-up:  1. Follow up with PCP in 1 week 2. Follow up with vascular surgery 3. Please follow up on the following pending results: None  Home Health: PT, OT, RN, aide Equipment/Devices: [employer] fast streamer, sliding board, manual wheelchair  Discharge Condition: stable CODE STATUS: Full code Diet recommendation: Renal diet   Brief/Interim Summary:  Admission HPI written by Norval Morton, MD   HPI: Jeffrey [name] is a 78 y.o. male with medical history significant of HTN, HLD, CAD, combined systolic and diastolic CHF, severe AS s/p TAVR, A. 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  Symptoms have been present over the last few weeks.  Fever reportedly checked at home up to 100.2 F.  He reports having a productive cough with yellowish sputum production and wheezing.  Family note patient's unable to get up and move without significant assistance.  He had a full dialysis treatment yesterday, but still reports having lower extremity swelling.  Family noticed that a lady in his center had the flu, but was wearing a mask.  He has been off and on antibiotics and steroids over the last several weeks.  Seen by his pulmonologist Dr. Melvyn Novas 2 days ago, and was started on antibiotics of cefdinir and steroid taper.  Patient reports that he has been utilizing his inhalers and breathing treatments without relief of symptoms.  On this day he was also seen by Dr. Havery Moros of GI for intermittent solid food dysphagia.  Patient underwent recent barium swallow showing slightly smaller caliber of lower to mid esophagus without focal stricturing or malignant  changes along with some nonspecific dysmotility.  EGD was deferred due to patient being unable to hold anticoagulation currently.  Associated symptoms of left lower rib /epigastric abdominal pain, poor appetite, leg swelling, severe diarrhea having bowel movements every 2 hours, hallucinating, tremor, and gum pain.  Notes gum pain similar to previous symptoms with his previous heart attacks.    Last hospitalization was from 07/30/2018-08/01/2018 for NSTEMI.  Patient underwent cardiac catheterization which revealed severe 2 vessel obstructive CAD.  Stents placed proximal to the mid LAD patent, PTCA of ostial Lcx. Cardiology recommended uninterrupted aspirin 81mg  and plavix 75mg  for minimum of 12 months.  Patient was switched to  Plavix and Eliquis.   ED Course: On admission into the emergency department patient was seen to be afebrile, pulse 64-65, respirations 17-23, blood pressure 112/68-119/47, and O2 saturation 97-98% on 3 L of  oxygen.  Labs revealed WBC 6.2, hemoglobin 9.2, sodium 131 potassium 4.6, chloride 95, BUN 26, creatinine 4.12, lactic acid 1.05, troponin 0.24, and BNP 792.7.  Chest x-ray showing stable large pericardial silhouette with blunted right costophrenic angle.  Patient had been given Rocephin, 324 mg aspirin to chew, breathing treatments, and return by milligrams of Solu-Medrol.  Cardiology have been consulted.  [facility] called to admit.   Hospital course:  RSV bronchitis Antibiotics discontinued. Supportive care. Improved. PT recommended SNF but patient declined.  COPD exacerbation Continues to improve. Significant sputum production today with some blood streaks. Treated with solumedrol which was transitioned to prednisone on discharge. Completed azithromycin. Continue home bronchodilators.  Chronic respiratory failure with hypoxia Continue 3L via  [place]  Dysphagia Question if patient is aspirating. RVP positive for RSV so doubting aspiration bacterial pneumonia. Dysphagia is  contributing to patient's increased fluid intake, however, which put patient above dry weight for ESRD. Discussed this with patient. Wife will adjust food consistency to allow for easier swallowing. Patient appears to have esophageal dysmotility and would benefit from GI referral as an outpatient.  CAD Stable. Continue metoprolol, plavix and Lipitor  Permanent atrial fibrillation Rate controlled. Continue Eliquis and metoprolol  Aortic stenosis s/p TAVR Stable  ESRD on HD Nephrology following. Did not tolerate full HD today secondary to leg cramps. Had HD today secondary to change in schedule from MWF to TTS.  Anemia of chronic disease In setting of kidney disease  Diabetes mellitus, type 2 Continue Levemir and SSI  Right shoulder weakness Associated discomfort. Shoulder x-ray shows rotator cuff injury. Outpatient follow-up including OT.  Leg cramping Suspect claudication. ABIs unable to be obtained secondary to calcifications. TBIs abnormal. Will put information for patient to follow-up with vascular surgery. Norco x3 doses prescribed for symptoms after hemodialysis.  Discharge Diagnoses:  Active Problems:   Type 2 diabetes mellitus with chronic kidney disease on chronic dialysis, with long-term current use of insulin (HCC)   CAD (coronary artery disease)   Essential hypertension   ESRD (end stage renal disease) on dialysis [facility])   Atrial fibrillation (HCC) [I48.91]   Chest pain   Chronic respiratory failure with hypoxia (HCC)   COPD exacerbation (HCC)   RSV (respiratory syncytial virus infection)   Troponin level elevated    Discharge Instructions  Discharge Instructions    Call MD for:  difficulty breathing, headache or visual disturbances   Complete by:  As directed    Increase activity slowly   Complete by:  As directed      Allergies as of 12/06/2018      Reactions   Byetta 10 Mcg Pen [exenatide] Diarrhea, Nausea And Vomiting   Codeine Itching    Coumadin [warfarin Sodium] Rash      Medication List    STOP taking these medications   cefdinir 300 MG capsule Commonly known as:  OMNICEF     TAKE these medications   albuterol 108 (90 Base) MCG/ACT inhaler Commonly known as:  PROVENTIL HFA;VENTOLIN HFA Inhale 2 puffs into the lungs every 4 (four) hours as needed for wheezing or shortness of breath.   apixaban 5 MG Tabs tablet Commonly known as:  ELIQUIS Take 1 tablet (5 mg total) by mouth 2 (two) times daily with a meal.   atorvastatin 80 MG tablet Commonly known as:  LIPITOR Take 1 tablet by mouth daily.   budesonide-formoterol 160-4.5 MCG/ACT inhaler Commonly known as:  SYMBICORT Inhale 2 puffs into the lungs 2 (two) times daily.   cinacalcet 30 MG tablet Commonly known as:  SENSIPAR Take 30 mg by mouth daily with supper.   clopidogrel 75 MG tablet Commonly known as:  PLAVIX Take 1 tablet (75 mg total) by mouth daily.   feeding supplement (PRO-STAT SUGAR FREE 64) Liqd Take 30 mLs by mouth 2 (two) times daily.   guaiFENesin 600 MG 12 hr tablet Commonly known as:  MUCINEX Take 1 tablet (600 mg total) by mouth 2 (two) times daily for 5 days.   HYDROcodone-acetaminophen 5-325 MG tablet Commonly known as:  NORCO/VICODIN Use as needed after hemodialysis for very severe leg cramps   insulin detemir 100 UNIT/ML injection Commonly known as:  LEVEMIR Inject 0.5 mLs (50 Units total) into the skin  at bedtime.   Insulin Pen Needle 31G X 6 MM Misc USE 1  THREE TIMES DAILY   lidocaine-prilocaine cream Commonly known as:  EMLA Apply 1 application topically once as needed (prior to accessing port).   loratadine 10 MG tablet Commonly known as:  CLARITIN Take 1 tablet (10 mg total) by mouth daily.   metoprolol tartrate 25 MG tablet Commonly known as:  LOPRESSOR One half twice daily What changed:    how much to take  how to take this  when to take this  additional instructions   nitroGLYCERIN 0.4 MG SL  tablet Commonly known as:  NITROSTAT Place 1 tablet (0.4 mg total) under the tongue every 5 (five) minutes as needed for chest pain.   OXYGEN Inhale into the lungs. 2-3 lpm 24/7   pantoprazole 40 MG tablet Commonly known as:  PROTONIX Take 1 tablet (40 mg total) by mouth daily.   predniSONE 10 MG tablet Commonly known as:  DELTASONE Take 4 tablets (40 mg total) by mouth daily with breakfast for 1 day, THEN 2 tablets (20 mg total) daily with breakfast for 2 days, THEN 1 tablet (10 mg total) daily with breakfast for 3 days. Start taking on:  December 07, 2018 What changed:  See the new instructions.   sevelamer carbonate 800 MG tablet Commonly known as:  RENVELA Take 1,600-4,000 mg by mouth See admin instructions. Take 5 tablets by mouth 3 times daily with meals and then 2 tablets twice daily with snacks   triamcinolone 0.1 % cream : eucerin Crea Apply 1 application topically 3 (three) times daily as needed. Apply to arms, back, and other areas with active rash/itching            Durable Medical Equipment  (From admission, onward)         Start     Ordered   12/05/18 1514  For home use only DME Other see comment  Once    Comments:  Sliding board   12/05/18 1514   12/03/18 1656  For home use only DME Other see comment  Once    Comments:  Hoyar lift with sling   12/03/18 1655   12/03/18 1327  For home use only DME lightweight manual wheelchair with seat cushion  Once    Comments:  Patient suffers from weakness with COPD which impairs their ability to perform daily activities like walking in the home.  A wheelchair will not resolve  issue with performing activities of daily living. A wheelchair will allow patient to safely perform daily activities. Patient is not able to propel themselves in the home using a standard weight wheelchair due to weakness Patient can self propel in the lightweight wheelchair.  Accessories: elevating leg rests (ELRs), wheel locks, extensions and  anti-tippers.   12/03/18 [facility] Follow up.   Why:  They will do your home health care at your home Contact information: [place] [address] [phone]        [name], Dianna Rossetti, MD. Schedule an appointment as soon as possible for a visit in 1 week(s).   Specialty:  Internal Medicine Contact information: [address] [place] [address] [phone]        Marty Heck, MD. Schedule an appointment as soon as possible for a visit.   Specialty:  Vascular Surgery Why:  Peripheral artery disease Contact information: [address]  [place] [address] [phone]          Allergies  Allergen Reactions  . Byetta 10 Mcg Pen [Exenatide] Diarrhea and Nausea And Vomiting  . Codeine Itching  . Coumadin [Warfarin Sodium] Rash    Consultations:  Nephrology   Procedures/Studies: Dg Chest 2 View  Result Date: 11/27/2018 CLINICAL DATA:  Cough with dyspnea, wheezing and congestion. EXAM: CHEST - 2 VIEW COMPARISON:  09/18/2018. FINDINGS: Cardiomegaly. Calcified tortuous aorta. Dual lead pacer RIGHT subclavian approach. TAVR. Moderate vascular congestion. Low lung volumes. Small BILATERAL effusions. IMPRESSION: Cardiomegaly. Moderate vascular congestion. Low lung volumes. No active infiltrates or failure. Worsening aeration from priors. Electronically Signed   By: Staci Righter M.D.   On: 11/27/2018 16:07   Dg Shoulder Right  Result Date: 12/03/2018 CLINICAL DATA:  Shoulder pain EXAM: RIGHT SHOULDER - 2+ VIEW COMPARISON:  09/02/2009 FINDINGS: Chronic widening of the [facility] joint. High-riding appearance of the humeral head with decreased subacromial space. Calcific tendinitis. No fracture or dislocation. Right-sided pacing generator IMPRESSION: 1. No acute osseous abnormality 2. High-riding right humeral head suggesting rotator cuff disease. Small amount of calcific tendinitis at the  humeral head Electronically Signed   By: Donavan Foil M.D.   On: 12/03/2018 17:37   Dg Chest Portable 1 View  Result Date: 11/29/2018 CLINICAL DATA:  Epigastric pain starting yesterday. Fever. Nausea and vomiting. EXAM: PORTABLE CHEST 1 VIEW COMPARISON:  11/27/2018 FINDINGS: Prominent enlargement of the cardiopericardial silhouette . Aortic valve replacement. Dual lead pacer. Atherosclerotic calcification of the aortic arch. Blunted right costophrenic angle similar to prior. The lungs appear otherwise clear. IMPRESSION: 1. Stable appearance of prominent enlargement of the cardiopericardial silhouette without edema. 2. Blunted right costophrenic angle, possibly from a small right pleural effusion. 3. Aortic valve prosthesis. 4.  Aortic Atherosclerosis (ICD10-I70.0). 5. Dual lead pacer. Electronically Signed   By: Van Clines M.D.   On: 11/29/2018 19:16   Vas [place] Burnard Bunting With/wo Tbi  Result Date: 12/06/2018 LOWER EXTREMITY DOPPLER STUDY Indications: Pain in legs after dialysis. High Risk         Hypertension, hyperlipidemia, Diabetes, coronary artery Factors:          disease. Other Factors: ESRD on dialysis, CHF, status post TAVR, AFIB, COPD,.  Comparison Study: No prior study on file for comparison Performing Technologist: Sharion Dove RVS  Examination Guidelines: A complete evaluation includes at minimum, Doppler waveform signals and systolic blood pressure reading at the level of bilateral brachial, anterior tibial, and posterior tibial arteries, when vessel segments are accessible. Bilateral testing is considered an integral part of a complete examination. Photoelectric Plethysmograph (PPG) waveforms and toe systolic pressure readings are included as required and additional duplex testing as needed. Limited examinations for reoccurring indications may be performed as noted.  ABI Findings: +---------+------------------+-----+---------+--------+ Right    Rt Pressure (mmHg)IndexWaveform Comment   +---------+------------------+-----+---------+--------+ Brachial 114                    triphasic         +---------+------------------+-----+---------+--------+ PTA      300               2.63 biphasic          +---------+------------------+-----+---------+--------+ DP       300               2.63 biphasic          +---------+------------------+-----+---------+--------+ Reynold Bowen  0.43                   +---------+------------------+-----+---------+--------+ +---------+------------------+-----+--------+----------+ Left     Lt Pressure (mmHg)IndexWaveformComment    +---------+------------------+-----+--------+----------+ Brachial                                restricted +---------+------------------+-----+--------+----------+ PTA      300               2.63 biphasic           +---------+------------------+-----+--------+----------+ DP       300               2.63 biphasic           +---------+------------------+-----+--------+----------+ Great Toe48                0.42                    +---------+------------------+-----+--------+----------+ +-------+-----------+-----------+------------+------------+ ABI/TBIToday's ABIToday's TBIPrevious ABIPrevious TBI +-------+-----------+-----------+------------+------------+ Right  2.63       0.43                                +-------+-----------+-----------+------------+------------+ Left   2.63       0.42                                +-------+-----------+-----------+------------+------------+ Arterial wall calcification precludes accurate ankle pressures and ABIs.  Summary: Right: Resting right ankle-brachial index indicates noncompressible right lower extremity arteries.The right toe-brachial index is abnormal. Left: Resting left ankle-brachial index indicates noncompressible left lower extremity arteries.The left toe-brachial index is abnormal.  *See table(s) above for measurements  and observations.     Preliminary       Subjective: No issues today.  Discharge Exam: Vitals:   12/06/18 1027 12/06/18 1349  BP: 126/85   Pulse: 61   Resp: 16   Temp: 97.8 F (36.6 C)   SpO2: 95% 97%   Vitals:   12/06/18 0915 12/06/18 0938 12/06/18 1027 12/06/18 1349  BP: (!) 111/44 100/74 126/85   Pulse: (!) 57 63 61   Resp:  15 16   Temp:  97.9 F (36.6 C) 97.8 F (36.6 C)   TempSrc:  Oral Oral   SpO2:  99% 95% 97%  Weight:  103.6 kg    Height:        General: Pt is alert, awake, not in acute distress Cardiovascular: RRR, S1/S2 +, no rubs, no gallops Respiratory: CTA bilaterally, no wheezing, no rhonchi Abdominal: Soft, NT, ND, bowel sounds +    The results of significant diagnostics from this hospitalization (including imaging, microbiology, ancillary and laboratory) are listed below for reference.     Microbiology: Recent Results (from the past 240 hour(s))  Respiratory Panel by PCR     Status: Abnormal   Collection Time: 11/29/18  6:54 PM  Result Value Ref Range Status   Adenovirus NOT DETECTED NOT DETECTED Final   Coronavirus 229E NOT DETECTED NOT DETECTED Final   Coronavirus HKU1 NOT DETECTED NOT DETECTED Final   Coronavirus NL63 NOT DETECTED NOT DETECTED Final   Coronavirus OC43 NOT DETECTED NOT DETECTED Final   Metapneumovirus NOT DETECTED NOT DETECTED Final   Rhinovirus / Enterovirus NOT DETECTED NOT DETECTED Final   Influenza A NOT DETECTED NOT DETECTED Final  Influenza B NOT DETECTED NOT DETECTED Final   Parainfluenza Virus 1 NOT DETECTED NOT DETECTED Final   Parainfluenza Virus 2 NOT DETECTED NOT DETECTED Final   Parainfluenza Virus 3 NOT DETECTED NOT DETECTED Final   Parainfluenza Virus 4 NOT DETECTED NOT DETECTED Final   Respiratory Syncytial Virus DETECTED (A) NOT DETECTED Final    Comment: CRITICAL RESULT CALLED TO, READ BACK BY AND VERIFIED WITH: M. TOBIAS,RN 2326 11/29/2018 T. TYSOR    Bordetella pertussis NOT DETECTED NOT DETECTED  Final   Chlamydophila pneumoniae NOT DETECTED NOT DETECTED Final   Mycoplasma pneumoniae NOT DETECTED NOT DETECTED Final  Culture, blood (routine x 2) Call MD if unable to obtain prior to antibiotics being given     Status: None   Collection Time: 11/30/18 12:37 AM  Result Value Ref Range Status   Specimen Description BLOOD RIGHT WRIST  Final   Special Requests   Final    BOTTLES DRAWN AEROBIC AND ANAEROBIC Blood Culture results may not be optimal due to an inadequate volume of blood received in culture bottles   Culture   Final    NO GROWTH 5 DAYS Performed at [facility], [place] [address], [place], [place] [address]    Report Status 12/05/2018 FINAL  Final  Culture, blood (routine x 2) Call MD if unable to obtain prior to antibiotics being given     Status: None   Collection Time: 11/30/18 12:40 AM  Result Value Ref Range Status   Specimen Description BLOOD RIGHT HAND  Final   Special Requests   Final    BOTTLES DRAWN AEROBIC AND ANAEROBIC Blood Culture results may not be optimal due to an inadequate volume of blood received in culture bottles   Culture   Final    NO GROWTH 5 DAYS Performed at [facility], [place] [address] [place], [place] [address]    Report Status 12/05/2018 FINAL  Final  Culture, sputum-assessment     Status: None   Collection Time: 11/30/18 11:18 AM  Result Value Ref Range Status   Specimen Description EXPECTORATED SPUTUM  Final   Special Requests NONE  Final   Sputum evaluation   Final    Sputum specimen not acceptable for testing.  Please recollect.   CRITICAL RESULT CALLED TO, READ BACK BY AND VERIFIED WITH: IDI RN AT [id number] ON [id number] BY SJW Performed at [facility], [place] [address] [place], [place] [address]    Report Status 11/30/2018 FINAL  Final  MRSA PCR Screening     Status: None   Collection Time: 11/30/18  3:40 PM  Result Value Ref Range Status   MRSA by PCR NEGATIVE NEGATIVE Final    Comment:        The GeneXpert MRSA Assay  (FDA approved for NASAL specimens only), is one component of a comprehensive MRSA colonization surveillance program. It is not intended to diagnose MRSA infection nor to guide or monitor treatment for MRSA infections. Performed at [facility], [place] [address] [place], [place] [address]      Labs: BNP (last 3 results) Recent Labs    07/27/18 0129 07/30/18 0955 11/29/18 1854  BNP 2,421.8* 3,146.0* 400.8*   Basic Metabolic Panel: Recent Labs  Lab 11/30/18 0331 12/01/18 1316 12/02/18 0455 12/03/18 0715 12/05/18 0655 12/06/18 0712  NA 133* 128* 131* 131* 129* 131*  K 5.3* 5.7* 4.8 4.4 4.0 4.1  CL 95* 91* 92* 94* 92* 94*  CO2 22 22 27  26 23 24   GLUCOSE 363* 169* 225* 303* 268* 194*  BUN 36* 64* 36* 35* 66* 57*  CREATININE 4.94* 5.93* 3.78* 3.06* 4.05* 3.50*  CALCIUM 9.8 9.2 8.7* 8.8* 8.7* 8.3*  MG  --   --  2.1  --   --   --   PHOS 5.7* 5.5*  --  3.9 3.9 3.5   Liver Function Tests: Recent Labs  Lab 11/30/18 0331 12/01/18 1316 12/03/18 0715 12/05/18 0655 12/06/18 0712  ALBUMIN 2.4* 2.5* 2.3* 2.5* 2.3*   No results for input(s): LIPASE, AMYLASE in the last 168 hours. No results for input(s): AMMONIA in the last 168 hours. CBC: Recent Labs  Lab 11/29/18 1854  12/01/18 1316 12/02/18 0455 12/03/18 0715 12/05/18 0655 12/06/18 0712  WBC 6.2   < > 11.7* 9.8 9.5 12.2* 15.8*  NEUTROABS 5.6  --   --  8.8*  --   --   --   HGB 9.2*   < > 8.9* 9.3* 9.2* 9.6* 9.3*  HCT 31.4*   < > 28.8* 30.8* 30.4* 31.2* 30.8*  MCV 90.2   < > 89.2 87.7 89.4 87.6 87.0  PLT 315   < > 358 322 329 378 351   < > = values in this interval not displayed.   Cardiac Enzymes: Recent Labs  Lab 11/29/18 1854 11/29/18 2301 11/30/18 0331 11/30/18 1118  TROPONINI 0.24* 0.20* 0.16* 1.15*   BNP: Invalid input(s): POCBNP CBG: Recent Labs  Lab 12/05/18 1213 12/05/18 1732 12/05/18 2249 12/06/18 1134 12/06/18 1628  GLUCAP 114* 221* 314* 106* 101*   D-Dimer No results for  input(s): DDIMER in the last 72 hours. Hgb A1c No results for input(s): HGBA1C in the last 72 hours. Lipid Profile No results for input(s): CHOL, HDL, LDLCALC, TRIG, CHOLHDL, LDLDIRECT in the last 72 hours. Thyroid function studies No results for input(s): TSH, T4TOTAL, T3FREE, THYROIDAB in the last 72 hours.  Invalid input(s): FREET3 Anemia work up No results for input(s): VITAMINB12, FOLATE, FERRITIN, TIBC, IRON, RETICCTPCT in the last 72 hours. Urinalysis    Component Value Date/Time   COLORURINE YELLOW 06/17/2017 [place] 06/17/2017 0547   LABSPEC 1.010 06/17/2017 0547   PHURINE 6.0 06/17/2017 0547   GLUCOSEU >=500 (A) 06/17/2017 0547   HGBUR SMALL (A) 06/17/2017 0547   BILIRUBINUR NEGATIVE 06/17/2017 0547   KETONESUR NEGATIVE 06/17/2017 0547   PROTEINUR 100 (A) 06/17/2017 0547   UROBILINOGEN 0.2 09/28/2014 2257   NITRITE NEGATIVE 06/17/2017 0547   LEUKOCYTESUR TRACE (A) 06/17/2017 0547   Sepsis Labs Invalid input(s): PROCALCITONIN,  WBC,  LACTICIDVEN Microbiology Recent Results (from the past 240 hour(s))  Respiratory Panel by PCR     Status: Abnormal   Collection Time: 11/29/18  6:54 PM  Result Value Ref Range Status   Adenovirus NOT DETECTED NOT DETECTED Final   Coronavirus 229E NOT DETECTED NOT DETECTED Final   Coronavirus HKU1 NOT DETECTED NOT DETECTED Final   Coronavirus NL63 NOT DETECTED NOT DETECTED Final   Coronavirus OC43 NOT DETECTED NOT DETECTED Final   Metapneumovirus NOT DETECTED NOT DETECTED Final   Rhinovirus / Enterovirus NOT DETECTED NOT DETECTED Final   Influenza A NOT DETECTED NOT DETECTED Final   Influenza B NOT DETECTED NOT DETECTED Final   Parainfluenza Virus 1 NOT DETECTED NOT DETECTED Final   Parainfluenza Virus 2 NOT DETECTED NOT DETECTED Final   Parainfluenza Virus 3 NOT DETECTED NOT DETECTED Final   Parainfluenza Virus 4 NOT DETECTED NOT DETECTED Final  Respiratory Syncytial Virus DETECTED (A) NOT DETECTED Final     Comment: CRITICAL RESULT CALLED TO, READ BACK BY AND VERIFIED WITH: M. TOBIAS,RN 2326 11/29/2018 T. TYSOR    Bordetella pertussis NOT DETECTED NOT DETECTED Final   Chlamydophila pneumoniae NOT DETECTED NOT DETECTED Final   Mycoplasma pneumoniae NOT DETECTED NOT DETECTED Final  Culture, blood (routine x 2) Call MD if unable to obtain prior to antibiotics being given     Status: None   Collection Time: 11/30/18 12:37 AM  Result Value Ref Range Status   Specimen Description BLOOD RIGHT WRIST  Final   Special Requests   Final    BOTTLES DRAWN AEROBIC AND ANAEROBIC Blood Culture results may not be optimal due to an inadequate volume of blood received in culture bottles   Culture   Final    NO GROWTH 5 DAYS Performed at [facility], [place] [address], [place], [place] [address]    Report Status 12/05/2018 FINAL  Final  Culture, blood (routine x 2) Call MD if unable to obtain prior to antibiotics being given     Status: None   Collection Time: 11/30/18 12:40 AM  Result Value Ref Range Status   Specimen Description BLOOD RIGHT HAND  Final   Special Requests   Final    BOTTLES DRAWN AEROBIC AND ANAEROBIC Blood Culture results may not be optimal due to an inadequate volume of blood received in culture bottles   Culture   Final    NO GROWTH 5 DAYS Performed at [facility], [place] [address], [place], [place] [address]    Report Status 12/05/2018 FINAL  Final  Culture, sputum-assessment     Status: None   Collection Time: 11/30/18 11:18 AM  Result Value Ref Range Status   Specimen Description EXPECTORATED SPUTUM  Final   Special Requests NONE  Final   Sputum evaluation   Final    Sputum specimen not acceptable for testing.  Please recollect.   CRITICAL RESULT CALLED TO, READ BACK BY AND VERIFIED WITH: IDI RN AT [id number] ON [id number] BY SJW Performed at [facility], [place] [address] [place], [place] [address]    Report Status 11/30/2018 FINAL  Final  MRSA PCR Screening     Status:  None   Collection Time: 11/30/18  3:40 PM  Result Value Ref Range Status   MRSA by PCR NEGATIVE NEGATIVE Final    Comment:        The GeneXpert MRSA Assay (FDA approved for NASAL specimens only), is one component of a comprehensive MRSA colonization surveillance program. It is not intended to diagnose MRSA infection nor to guide or monitor treatment for MRSA infections. Performed at [facility], [place] [address] [place], [place] [address]      SIGNED:   Cordelia Poche, MD [facility] 12/06/2018, 4:36 PM

## 2018-12-06 NOTE — Progress Notes (Signed)
VASCULAR LAB PRELIMINARY  PRELIMINARY  PRELIMINARY  PRELIMINARY  ABIs completed.    Preliminary report:  Unable to adequately ascertain ABIs secondary to calcified vessels.  TBIs are abnormal, bilaterally.  Jeffrey [name], RVT 12/06/2018, 3:26 PM

## 2018-12-06 NOTE — Progress Notes (Signed)
[facility] Progress Note  Subjective: on HD now w no new /co  Vitals:   12/06/18 0900 12/06/18 0915 12/06/18 0938 12/06/18 1027  BP: (!) 108/48 (!) 111/44 100/74 126/85  Pulse: (!) 58 (!) 57 63 61  Resp:   15 16  Temp:   97.9 F (36.6 C) 97.8 F (36.6 C)  TempSrc:   Oral Oral  SpO2:   99% (!) 58%  Weight:   103.6 kg   Height:        Inpatient medications: . apixaban  5 mg Oral BID  . arformoterol  15 mcg Nebulization BID  . atorvastatin  80 mg Oral Daily  . budesonide (PULMICORT) nebulizer solution  0.5 mg Nebulization BID  . cinacalcet  30 mg Oral Q supper  . clopidogrel  75 mg Oral Daily  . doxercalciferol  2 mcg Intravenous Q M,W,F-HD  . feeding supplement (PRO-STAT SUGAR FREE 64)  30 mL Oral BID  . guaiFENesin  600 mg Oral BID  . insulin aspart  0-5 Units Subcutaneous QHS  . insulin aspart  0-9 Units Subcutaneous TID WC  . insulin detemir  50 Units Subcutaneous QHS  . ipratropium-albuterol  3 mL Nebulization TID  . methylPREDNISolone (SOLU-MEDROL) injection  40 mg Intravenous Daily  . metoprolol tartrate  12.5 mg Oral BID  . pantoprazole  40 mg Oral Daily  . sevelamer carbonate  4.8 g Oral TID WC    acetaminophen **OR** acetaminophen, hydrOXYzine, ipratropium-albuterol, lidocaine-prilocaine, Melatonin, nitroGLYCERIN, ondansetron **OR** ondansetron (ZOFRAN) IV, sevelamer carbonate, sodium chloride  Iron/TIBC/Ferritin/ %Sat    Component Value Date/Time   IRON 59 01/12/2015 0730   TIBC 227 01/12/2015 0730   FERRITIN 189 01/12/2015 0730   IRONPCTSAT 26 01/12/2015 0730    Exam: Gen: alert, obese WF no distress on HD Neck: Supple. No lymphadenopathy Lungs: BS decreased, clear, no rales or wheezing Heart: RRR. +7/8 systolic murmur, no rubs or gallops.  Abdomen: soft, nontender, +BS, no guarding, no rebound tenderness  Ext: edema improving , 1+ now Neuro: AAOx3. Moves all extremities spontaneously. Dialysis Access: LU AVF +b/t  Dialysis Orders:   MWF NW  4h  400/800   105kg   2K/3.5 bath  LUA AVF  Hep none  M225 every 2wks last 12/24  Hect 2 ug tiw   Assessment: 1. RSV bronchitis 2. COPD ? home O2 3. Vol excess: wt's down sig, under dry wt now slightly  4. ESRD HD MWF 5. HTN BP's down w/ vol down 6. 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 Chronic syst CHF 9. AS s/p TAVR 10. Atrial fib 11. CAD 86. Debility   Plan: 1. Short HD today since he is changing to TTS starting next week 2. OK for dc after HD    Kelly Splinter MD [facility] pager [phone]   12/06/2018, 10:32 AM   Recent Labs  Lab 12/05/18 0655 12/06/18 0712  NA 129* 131*  K 4.0 4.1  CL 92* 94*  CO2 23 24  GLUCOSE 268* 194*  BUN 66* 57*  CREATININE 4.05* 3.50*  CALCIUM 8.7* 8.3*  PHOS 3.9 3.5  ALBUMIN 2.5* 2.3*   No results for input(s): AST, ALT, ALKPHOS, BILITOT, PROT in the last 168 hours. Recent Labs  Lab 11/29/18 1854  12/02/18 0455  12/05/18 0655 12/06/18 0712  WBC 6.2   < > 9.8   < > 12.2* 15.8*  NEUTROABS 5.6  --  8.8*  --   --   --  HGB 9.2*   < > 9.3*   < > 9.6* 9.3*  HCT 31.4*   < > 30.8*   < > 31.2* 30.8*  MCV 90.2   < > 87.7   < > 87.6 87.0  PLT 315   < > 322   < > 378 351   < > = values in this interval not displayed.

## 2018-12-09 ENCOUNTER — Ambulatory Visit: Payer: Medicare Other | Admitting: Family Medicine

## 2018-12-11 NOTE — Progress Notes (Signed)
Received message that patient's PCP has retired per Linna Hoff with [facility]; CM called patient at home, he is not interested in finding a new PCP at this time; Aneta Mins [phone]

## 2018-12-20 LAB — CUP PACEART REMOTE DEVICE CHECK
Date Time Interrogation Session: 20200125070126
Implantable Lead Implant Date: 20160815
Implantable Lead Location: 753859
Implantable Lead Model: 5076
Implantable Lead Model: 5076
Implantable Pulse Generator Implant Date: 20160815
MDC IDC LEAD IMPLANT DT: 20160815
MDC IDC LEAD LOCATION: 753860
Pulse Gen Model: 394929
Pulse Gen Serial Number: 68600861

## 2018-12-24 ENCOUNTER — Telehealth: Payer: Self-pay | Admitting: Internal Medicine

## 2018-12-24 NOTE — Telephone Encounter (Signed)
Called and spoke with patient wife and she stated she was called by our office about test results from the hospital and they were to be forwarded to Dr. Melvyn Novas. She stated she would check her husband's vmail to obtain name of the person that called.  I advised her that she would be called back in a few minutes to allow her time to obtain message information, before calling her back.  Waited 5 minutes before attempted call back.  Unable to reach patient wife Jeffrey [name]. LVMTCB with information discussed above.

## 2018-12-25 ENCOUNTER — Ambulatory Visit: Payer: Medicare Other | Admitting: Internal Medicine

## 2018-12-25 NOTE — Telephone Encounter (Signed)
LVMTCB x 2 for [name] (wife).

## 2018-12-26 ENCOUNTER — Ambulatory Visit: Payer: Medicare Other | Admitting: Internal Medicine

## 2018-12-26 NOTE — Telephone Encounter (Signed)
ATC Patients Wife, Jeffrey [name].  Left message to call back.

## 2018-12-29 NOTE — Telephone Encounter (Signed)
Called and spoke with Patient. CXR results and recommendations from 11/27/18, given.  Understanding stated.  Patients Wife was not home at the time, and left message for her to call back with any questions.

## 2019-01-07 ENCOUNTER — Encounter: Payer: Self-pay | Admitting: Family Medicine

## 2019-01-07 ENCOUNTER — Ambulatory Visit: Payer: Medicare Other | Admitting: Family Medicine

## 2019-01-07 ENCOUNTER — Ambulatory Visit (INDEPENDENT_AMBULATORY_CARE_PROVIDER_SITE_OTHER): Payer: Medicare Other | Admitting: Family Medicine

## 2019-01-07 ENCOUNTER — Telehealth: Payer: Self-pay | Admitting: Family Medicine

## 2019-01-07 VITALS — BP 115/50 | HR 55 | Resp 18 | Ht 71.0 in | Wt 280.8 lb

## 2019-01-07 DIAGNOSIS — I2510 Atherosclerotic heart disease of native coronary artery without angina pectoris: Secondary | ICD-10-CM

## 2019-01-07 DIAGNOSIS — Z7409 Other reduced mobility: Secondary | ICD-10-CM

## 2019-01-07 DIAGNOSIS — R0902 Hypoxemia: Secondary | ICD-10-CM

## 2019-01-07 DIAGNOSIS — Z992 Dependence on renal dialysis: Secondary | ICD-10-CM

## 2019-01-07 DIAGNOSIS — E1159 Type 2 diabetes mellitus with other circulatory complications: Secondary | ICD-10-CM

## 2019-01-07 DIAGNOSIS — I509 Heart failure, unspecified: Secondary | ICD-10-CM

## 2019-01-07 DIAGNOSIS — L24A9 Irritant contact dermatitis due friction or contact with other specified body fluids: Secondary | ICD-10-CM

## 2019-01-07 DIAGNOSIS — H019 Unspecified inflammation of eyelid: Secondary | ICD-10-CM

## 2019-01-07 DIAGNOSIS — R600 Localized edema: Secondary | ICD-10-CM

## 2019-01-07 DIAGNOSIS — Z9981 Dependence on supplemental oxygen: Secondary | ICD-10-CM

## 2019-01-07 DIAGNOSIS — J449 Chronic obstructive pulmonary disease, unspecified: Secondary | ICD-10-CM | POA: Diagnosis not present

## 2019-01-07 DIAGNOSIS — T148XXA Other injury of unspecified body region, initial encounter: Secondary | ICD-10-CM

## 2019-01-07 DIAGNOSIS — N186 End stage renal disease: Secondary | ICD-10-CM

## 2019-01-07 DIAGNOSIS — Z7689 Persons encountering health services in other specified circumstances: Secondary | ICD-10-CM

## 2019-01-07 LAB — GLUCOSE, POCT (MANUAL RESULT ENTRY): POC Glucose: 164 mg/dl — AB (ref 70–99)

## 2019-01-07 MED ORDER — HYDROXYZINE HCL 25 MG PO TABS: 12.5000 mg | ORAL_TABLET | Freq: Two times a day (BID) | ORAL | 1 refills | Status: DC | PRN

## 2019-01-07 MED ORDER — TRIAMCINOLONE 0.1 % CREAM:EUCERIN CREAM 1:1: 1.0000 "application " | TOPICAL_CREAM | Freq: Three times a day (TID) | CUTANEOUS | 1 refills | Status: DC | PRN

## 2019-01-07 MED ORDER — TRIAMCINOLONE ACETONIDE 0.1 % EX CREA: 1.0000 "application " | TOPICAL_CREAM | Freq: Two times a day (BID) | CUTANEOUS | 1 refills | Status: DC

## 2019-01-07 MED ORDER — GUAIFENESIN ER 600 MG PO TB12: 600.0000 mg | ORAL_TABLET | Freq: Two times a day (BID) | ORAL | 1 refills | Status: DC

## 2019-01-07 MED ORDER — INSULIN DETEMIR 100 UNIT/ML ~~LOC~~ SOLN: 48.0000 [IU] | Freq: Every day | SUBCUTANEOUS | 2 refills | Status: DC

## 2019-01-07 MED ORDER — ATORVASTATIN CALCIUM 80 MG PO TABS: 80.0000 mg | ORAL_TABLET | Freq: Every day | ORAL | 3 refills | Status: AC

## 2019-01-07 MED ORDER — HYDROXYZINE HCL 25 MG PO TABS: 12.5000 mg | ORAL_TABLET | Freq: Three times a day (TID) | ORAL | 1 refills | Status: DC | PRN

## 2019-01-07 MED ORDER — CEPHALEXIN 500 MG PO CAPS: 500.0000 mg | ORAL_CAPSULE | Freq: Two times a day (BID) | ORAL | 0 refills | Status: AC

## 2019-01-07 MED ORDER — ALBUTEROL SULFATE (2.5 MG/3ML) 0.083% IN NEBU: 2.5000 mg | INHALATION_SOLUTION | Freq: Four times a day (QID) | RESPIRATORY_TRACT | 1 refills | Status: AC | PRN

## 2019-01-07 NOTE — Telephone Encounter (Signed)
I have refilled atorvastatin. Patient will need to contact cardiology regarding metoprolol as this is a cardiac medication may need adjusting based on recent impatient hospitalization for chest pain.

## 2019-01-07 NOTE — Patient Instructions (Addendum)
Thank you for choosing Primary Care at [facility] to be your medical home!    Jeffrey [name] was seen by [name], FNP today.   Simonne Come Jared's primary care provider is Scot Jun, FNP.   For the best care possible, you should try to see [name], FNP-C whenever you come to the clinic.   We look forward to seeing you again soon!  If you have any questions about your visit today, please call us at [phone] or feel free to reach your primary care provider via [place].     Please contact the dialysis center to request an additional day of dialysis given patient's chronic lower extremity swelling.  If the office has any questions feel free to contact me at [phone].  I am placing an order for advance home care to come out to perform wound care as well as compression wraps to help reduce bilateral lower extremity swelling.    Diuretics are not indicated  given patient's inability to urinate regularly.   For rash and anxiety: Hydroxyzine has been ordered, to take twice daily as needed for anxiety and/or itching. I have also ordered triamcinolone cream to be applied twice daily to the rash. For infection of the eye structure and also for redness which is occurring on the lower legs which may in fact be a skin infection associated with the blistering I have prescribed Keflex 500 mg twice daily for 7 days.    Edema  Edema is when you have too much fluid in your body or under your skin. Edema may make your legs, feet, and ankles swell up. Swelling is also common in looser tissues, like around your eyes. This is a common condition. It gets more common as you get older. There are many possible causes of edema. Eating too much salt (sodium) and being on your feet or sitting for a long time can cause edema in your legs, feet, and ankles. Hot weather may make edema worse. Edema is usually painless. Your skin may look swollen or shiny. Follow these instructions at  home:  Keep the swollen body part raised (elevated) above the level of your heart when you are sitting or lying down.  Do not sit still or stand for a long time.  Do not wear tight clothes. Do not wear garters on your upper legs.  Exercise your legs. This can help the swelling go down.  Wear elastic bandages or support stockings as told by your doctor.  Eat a low-salt (low-sodium) diet to reduce fluid as told by your doctor.  Depending on the cause of your swelling, you may need to limit how much fluid you drink (fluid restriction).  Take over-the-counter and prescription medicines only as told by your doctor. Contact a doctor if:  Treatment is not working.  You have heart, liver, or kidney disease and have symptoms of edema.  You have sudden and unexplained weight gain. Get help right away if:  You have shortness of breath or chest pain.  You cannot breathe when you lie down.  You have pain, redness, or warmth in the swollen areas.  You have heart, liver, or kidney disease and get edema all of a sudden.  You have a fever and your symptoms get worse all of a sudden. Summary  Edema is when you have too much fluid in your body or under your skin.  Edema may make your legs, feet, and ankles swell up. Swelling is also common in looser  tissues, like around your eyes.  Raise (elevate) the swollen body part above the level of your heart when you are sitting or lying down.  Follow your doctor's instructions about diet and how much fluid you can drink (fluid restriction). This information is not intended to replace advice given to you by your health care provider. Make sure you discuss any questions you have with your health care provider. Document Released: 04/30/2008 Document Revised: 11/30/2016 Document Reviewed: 11/30/2016 Elsevier Interactive Patient Education  2019 [employer].

## 2019-01-07 NOTE — Progress Notes (Signed)
Jeffrey [name], is a 78 y.o. male  EXN:[id number]  WHQ:[id number]  DOB - 1941-01-02  CC:  Chief Complaint  Patient presents with  . Establish Care  . Diabetes    CBG 164  . Hypertension  . Hyperlipidemia  . Home Health       HPI: Jeffrey [name] is a 78 y.o. male is here today to establish care.   Jeffrey [name] has History of stroke; Type 2 diabetes mellitus with chronic kidney disease on chronic dialysis, with long-term current use of insulin ([place]); CAD (coronary artery disease); Essential hypertension; Elevated troponin; Mobitz type 1 second degree AV block; ESRD (end stage renal disease) on dialysis ([place]); Protein-calorie malnutrition, severe ([place]); Atherosclerosis of native coronary artery of native heart with angina pectoris ([place]); Mobitz (type) I (Wenckebach's) atrioventricular block; Fluid overload; Acute on chronic respiratory failure with hypoxia ([place]); Chronic combined systolic and diastolic CHF (congestive heart failure) ([place]); Hypervolemia; S/P TAVR (transcatheter aortic valve replacement); Non-healing open wound of left groin; Allergic rhinitis; Absolute anemia; History of tobacco use; Hyperlipidemia; Automatic implantable cardioverter-defibrillator in situ; Idiopathic peripheral neuropathy; ED (erectile dysfunction) of organic origin; Arthritis, degenerative; Hyponatremia; COPD with acute exacerbation ([place]); Obesity (BMI 30-39.9); Atrial fibrillation ([place]) [I48.91]; Encounter for therapeutic drug monitoring; COPD GOLD 0; NSTEMI (non-ST elevated myocardial infarction) ([place]); Rash; Chest pain; GERD (gastroesophageal reflux disease); Cough; Respiratory distress; Chronic respiratory failure with hypoxia (HCC); COPD exacerbation (HCC); RSV (respiratory syncytial virus infection); and Troponin level elevated on their problem list.   Patient presents today accompanied by family with a complaint of multiple worsening chronic comorbid conditions and a recent deterioration in ability to perform ADLs  and mobility. Patient reports that he experienced a fall back in December in which he sustained a head injury.  Since that time he has gradually had diminished ability to ambulate.  He has been confined to a went wheelchair for greater than 1 month.  He is unable to perform weightbearing at all on his legs.  He has severe bilateral lower leg edema which is recently developed multiple blisters and wounds on his lower legs due to fluid overload.  He suffers from end-stage renal disease and has anuria therefore he is not prescribed diuretic therapy.  Patient did not have a PCP as he is retired greater than a year ago and he has been unable to receive a referral for physical therapy and home.  He currently does not have home health services either.  His elderly wife is assisting with his care which is impacting her overall health.  Patient is oxygen dependent and suffers from COPD.  He is also followed by pulmonology.  Has a very extensive cardiovascular history and is followed by cardiology.  He has a pacemaker in place.  Concerns today include requesting a referral for home health services for physical therapy and to assist with wound care management of bilateral legs.  Given current immobility patient has difficulty ambulating to the restroom and is in need of a bedside commode.  He has a walker at home however has very diminished strength in his bilateral lower legs and diminished upper body strength and is unable to bear weight walker. Over the course of the last few weeks he has developed worsening air hunger and associated anxiety with an inability to "catch his breath". Patient is unable to lay down with head elevated as he has the sensation of inability to breath.  Most recent chest x-ray was significant for pulmonary vascular congestion.  Patient currently attends dialysis 3  days/week however due to worsening bilateral lower leg edema he is scheduled this week to have dialysis 4 days.  He is currently  sitting up in a chair in order to facilitate adequate ventilation even with the use of oxygen.Jeffrey [name]  He has had to titrate his oxygen up to 3 to 4 L for comfort when trying to sleep.  On average she has been able to maintain oxygen level at 2 to 3 L. He also complains of left eye redness and drainage gradually worsening over the course of a week.  He has no known recent contacts with anyone with conjunctivitis.  Eyes itchy and matted shut upon awakening.  It drains clear to purulent drainage throughout the day.  Most of the redness is located to the upper eyelid around the eyelashes.  He admits to routinely rubbing his eyes.  Denies any itching or drainage from the right eye.  Patient denies new headaches, chest pain, abdominal pain, nausea, new weakness , numbness or tingling, SOB, edema, or worrisome cough.     Current medications: Current Outpatient Medications:  .  albuterol (PROVENTIL HFA;VENTOLIN HFA) 108 (90 Base) MCG/ACT inhaler, Inhale 2 puffs into the lungs every 4 (four) hours as needed for wheezing or shortness of breath., Disp: 1 Inhaler, Rfl: 11 .  apixaban (ELIQUIS) 5 MG TABS tablet, Take 1 tablet (5 mg total) by mouth 2 (two) times daily with a meal., Disp: 180 tablet, Rfl: 3 .  atorvastatin (LIPITOR) 80 MG tablet, Take 1 tablet by mouth daily., Disp: , Rfl:  .  budesonide-formoterol (SYMBICORT) 160-4.5 MCG/ACT inhaler, Inhale 2 puffs into the lungs 2 (two) times daily., Disp: 1 Inhaler, Rfl: 0 .  cinacalcet (SENSIPAR) 30 MG tablet, Take 30 mg by mouth daily with supper., Disp: , Rfl:  .  clopidogrel (PLAVIX) 75 MG tablet, Take 1 tablet (75 mg total) by mouth daily., Disp: 90 tablet, Rfl: 3 .  insulin detemir (LEVEMIR) 100 UNIT/ML injection, Inject 0.5 mLs (50 Units total) into the skin at bedtime., Disp: 10 mL, Rfl: 2 .  Insulin Pen Needle 31G X 6 MM MISC, USE 1  THREE TIMES DAILY, Disp: , Rfl:  .  lidocaine-prilocaine (EMLA) cream, Apply 1 application topically once as needed (prior to  accessing port). , Disp: , Rfl:  .  loratadine (CLARITIN) 10 MG tablet, Take 10 mg by mouth daily., Disp: , Rfl:  .  metoprolol tartrate (LOPRESSOR) 25 MG tablet, One half twice daily (Patient taking differently: Take 12.5 mg by mouth 2 (two) times daily. ), Disp: , Rfl:  .  nitroGLYCERIN (NITROSTAT) 0.4 MG SL tablet, Place 1 tablet (0.4 mg total) under the tongue every 5 (five) minutes as needed for chest pain., Disp: 30 tablet, Rfl: 12 .  OXYGEN, Inhale into the lungs. 2-3 lpm 24/7, Disp: , Rfl:  .  pantoprazole (PROTONIX) 40 MG tablet, Take 1 tablet (40 mg total) by mouth daily., Disp: 90 tablet, Rfl: 3 .  sevelamer carbonate (RENVELA) 800 MG tablet, Take 1,600-4,000 mg by mouth See admin instructions. Take 5 tablets by mouth 3 times daily with meals and then 2 tablets twice daily with snacks , Disp: , Rfl:  .  Triamcinolone Acetonide (TRIAMCINOLONE 0.1 % CREAM : EUCERIN) CREA, Apply 1 application topically 3 (three) times daily as needed. Apply to arms, back, and other areas with active rash/itching, Disp: 1 each, Rfl: 1   Pertinent family medical history: family history includes Alzheimer's disease in his maternal aunt and mother; Cancer in his  maternal uncle; Diabetes in his father, paternal grandmother, and sister; Heart disease in his father; Stroke in his mother.   Allergies  Allergen Reactions  . Byetta 10 Mcg Pen [Exenatide] Diarrhea and Nausea And Vomiting  . Codeine Itching  . Coumadin [Warfarin Sodium] Rash    Social History   Socioeconomic History  . Marital status: Married    Spouse name: Not on file  . Number of children: 3  . Years of education: Not on file  . Highest education level: Not on file  Occupational History  . Occupation: retired  Scientific laboratory technician  . Financial resource strain: Not on file  . Food insecurity:    Worry: Not on file    Inability: Not on file  . Transportation needs:    Medical: Not on file    Non-medical: Not on file  Tobacco Use  . Smoking  status: Former Smoker    Packs/day: 2.00    Years: 32.00    Pack years: 64.00    Last attempt to quit: 11/27/1983    Years since quitting: 35.1  . Smokeless tobacco: Never Used  Substance and Sexual Activity  . Alcohol use: Yes    Comment: rarely  . Drug use: No  . Sexual activity: Yes    Birth control/protection: None  Lifestyle  . Physical activity:    Days per week: Not on file    Minutes per session: Not on file  . Stress: Not on file  Relationships  . Social connections:    Talks on phone: Not on file    Gets together: Not on file    Attends religious service: Not on file    Active member of club or organization: Not on file    Attends meetings of clubs or organizations: Not on file    Relationship status: Not on file  . Intimate partner violence:    Fear of current or ex partner: Not on file    Emotionally abused: Not on file    Physically abused: Not on file    Forced sexual activity: Not on file  Other Topics Concern  . Not on file  Social History Narrative  . Not on file    Review of Systems: Pertinent negatives listed in HPI  Objective:   Vitals:   01/07/19 1011  BP: (!) 115/50  Pulse: (!) 55  Resp: 18  SpO2: 96%    BP Readings from Last 3 Encounters:  01/07/19 (!) 115/50  12/06/18 126/85  11/27/18 (!) 130/50    Filed Weights   01/07/19 1011  Weight: 280 lb 12.8 oz (127.4 kg)      Physical Exam: Constitutional: Patient appears sickly HENT: Normocephalic, atraumatic, External right and left ear normal. Oropharynx is clear and moist.  Eyes: Left GGY:[id number] scleral erythema. Dried purulent exudate upper border upper lid eyelash normal.  Upper eyelid is erythematous and edematous. Neck: Normal ROM. Neck supple. No JVD. No tracheal deviation CVS: RRR- no murmurs, no gallops auscultated on exam. Pulmonary: Increased effort, nasal cannula with 3 L of oxygen in place, O2 sat 90% at rest, no wheezing present.  Rhonchorous cough noted  throughout exam.  Diminished lung sounds throughout fields. Abdominal: Soft.  Slightly distended.No tenderness Musculoskeletal: Gross bilateral pitting edema, with blistering wounds and weeping present unable to ambulate during exam.  Immobile and wheelchair Neuro: Alert, oriented. Normal coordination bilateral upper extremities. Skin: Skin is warm and dry. No rash noted. Not diaphoretic. No erythema. No pallor. Psychiatric: Normal mood and  affect. Behavior, judgment, thought content normal.  Lab Results (prior encounters)  Lab Results  Component Value Date   WBC 15.8 (H) 12/06/2018   HGB 9.3 (L) 12/06/2018   HCT 30.8 (L) 12/06/2018   MCV 87.0 12/06/2018   PLT 351 12/06/2018   Lab Results  Component Value Date   CREATININE 3.50 (H) 12/06/2018   BUN 57 (H) 12/06/2018   NA 131 (L) 12/06/2018   K 4.1 12/06/2018   CL 94 (L) 12/06/2018   CO2 24 12/06/2018    Lab Results  Component Value Date   HGBA1C 7.4 (H) 04/14/2018       Component Value Date/Time   CHOL 80 04/15/2018 0311   TRIG 179 (H) 04/15/2018 0311   HDL 22 (L) 04/15/2018 0311   CHOLHDL 3.6 04/15/2018 0311   VLDL 36 04/15/2018 0311   LDLCALC 22 04/15/2018 0311        Assessment and plan:  1. Encounter to establish care 2. Type 2 diabetes mellitus with other circulatory complication, unspecified whether long term insulin use (HCC) Well-controlled in the past.  Last A1c was less than 7.  Currently managed with Levemir 45 units once daily.  No changes today.  We will check a CMP and an A1c. - Glucose (CBG) - Comprehensive metabolic panel - Hemoglobin A1c  3. COPD GOLD 0 Encourage consistent use of prescribed inhalers to reduce current symptoms.  Refilled albuterol for nebulizer machine patient has a machine has not been using recently due to no refills of albuterol solution.  Given patient's COPD status and hypoxia will order a bedside commode and have placed a referral for home health services to help with  mobility - DME Bedside commode - Ambulatory referral to [facility]  4. ESRD on dialysis [facility]) - Comprehensive metabolic panel - DME Bedside commode - Ambulatory referral to Home Health  5. Bilateral leg edema -Patient has gross bilateral lower leg edema.  He is in eligible for diuretic therapy given his current status of end-stage renal disease.  He has developed significant blistering of the lower legs given the degree of his bilateral lower leg edema.  He has an extra day of dialysis scheduled this week to remove additional fluid.  I have ordered a home health wound care consult.  Patient would benefit from compression wraps along with wound care management.  I am placing an order for Keflex to treat both eyelid infection along with lower leg infection which is suspicious for cellulitis. - CBC with Differential - DME Bedside commode - Ambulatory referral to [facility]  6. Infected eye lid -Patient appears to have blepharitis. Treating with Keflex 500 mg 3 times daily x7 days.  7. Cardiovascular disease,  - Ambulatory referral to [facility]  8. Immobility - DME Bedside commode - Ambulatory referral to Home Health  9. Congestive heart failure, unspecified HF chronicity, unspecified heart failure type ([place]) - [facility] referral to [facility]  10. Hypoxia - Ambulatory referral to [facility]. Oxygen dependent - Ambulatory referral to Home Health  RTC: 4 weeks BLE edema, hypoxia, and anxiety   A total of 45 minutes spent, greater than 50 % of this time was spent reviewing extensive medical history, evaluating current conditions and needs, counseling and coordination of care.    The patient was given clear instructions to go to ER or return to medical center if symptoms don't improve, worsen or new problems develop. The patient verbalized understanding. The patient was advised  to call and  obtain lab results if they haven't heard anything from out office within 7-10  business days.  [name], FNP Primary Care at [facility] [address] [place] [address] [phone]: [phone]    This note has been created with Dragon speech recognition software and Engineer, materials. Any transcriptional errors are unintentional.

## 2019-01-07 NOTE — Telephone Encounter (Signed)
Patient's wife called requesting for the metoprolol tartrate (LOPRESSOR) 25 MG tablet [[id number]] and the atorvastatin (LIPITOR) 80 MG tablet [[id number]]  To be refilled for the patient and sent to the [facility] on [address], please follow up.

## 2019-01-08 LAB — COMPREHENSIVE METABOLIC PANEL
A/G RATIO: 1.5 (ref 1.2–2.2)
ALT: 8 IU/L (ref 0–44)
AST: 15 IU/L (ref 0–40)
Albumin: 3.7 g/dL (ref 3.7–4.7)
Alkaline Phosphatase: 118 IU/L — ABNORMAL HIGH (ref 39–117)
BUN/Creatinine Ratio: 7 — ABNORMAL LOW (ref 10–24)
BUN: 29 mg/dL — ABNORMAL HIGH (ref 8–27)
Bilirubin Total: 0.8 mg/dL (ref 0.0–1.2)
CALCIUM: 9.5 mg/dL (ref 8.6–10.2)
CO2: 24 mmol/L (ref 20–29)
Chloride: 97 mmol/L (ref 96–106)
Creatinine, Ser: 4.34 mg/dL — ABNORMAL HIGH (ref 0.76–1.27)
GFR calc Af Amer: 14 mL/min/{1.73_m2} — ABNORMAL LOW (ref >59)
GFR, EST NON AFRICAN AMERICAN: 12 mL/min/{1.73_m2} — AB (ref >59)
Globulin, Total: 2.4 g/dL (ref 1.5–4.5)
Glucose: 136 mg/dL — ABNORMAL HIGH (ref 65–99)
Potassium: 5.6 mmol/L — ABNORMAL HIGH (ref 3.5–5.2)
Sodium: 137 mmol/L (ref 134–144)
Total Protein: 6.1 g/dL (ref 6.0–8.5)

## 2019-01-08 LAB — CBC WITH DIFFERENTIAL/PLATELET
Basophils Absolute: 0 10*3/uL (ref 0.0–0.2)
Basos: 1 %
EOS (ABSOLUTE): 0.1 10*3/uL (ref 0.0–0.4)
Eos: 1 %
Hematocrit: 29.6 % — ABNORMAL LOW (ref 37.5–51.0)
Hemoglobin: 9.2 g/dL — ABNORMAL LOW (ref 13.0–17.7)
Immature Grans (Abs): 0.2 10*3/uL — ABNORMAL HIGH (ref 0.0–0.1)
Immature Granulocytes: 2 %
Lymphocytes Absolute: 1.4 10*3/uL (ref 0.7–3.1)
Lymphs: 20 %
MCH: 29 pg (ref 26.6–33.0)
MCHC: 31.1 g/dL — AB (ref 31.5–35.7)
MCV: 93 fL (ref 79–97)
MONOS ABS: 1.3 10*3/uL — AB (ref 0.1–0.9)
Monocytes: 17 %
Neutrophils Absolute: 4.4 10*3/uL (ref 1.4–7.0)
Neutrophils: 59 %
PLATELETS: 211 10*3/uL (ref 150–450)
RBC: 3.17 x10E6/uL — ABNORMAL LOW (ref 4.14–5.80)
RDW: 17.3 % — AB (ref 11.6–15.4)
WBC: 7.3 10*3/uL (ref 3.4–10.8)

## 2019-01-08 LAB — HEMOGLOBIN A1C
Est. average glucose Bld gHb Est-mCnc: 134 mg/dL
Hgb A1c MFr Bld: 6.3 % — ABNORMAL HIGH (ref 4.8–5.6)

## 2019-01-08 NOTE — Progress Notes (Signed)
Scanned office note, demographics & orders to [email or web address]

## 2019-01-08 NOTE — Telephone Encounter (Signed)
Contacted patient's wife to inform her. Had no further questions.

## 2019-01-12 ENCOUNTER — Telehealth: Payer: Self-pay

## 2019-01-12 NOTE — Progress Notes (Signed)
Patient notified of results & recommendations. Expressed understanding. States that his FSBS readings have been in the 160s.  Patient did receive 4 days of dialysis last week.

## 2019-01-12 NOTE — Telephone Encounter (Signed)
Patient states that his eye isn't doing much better. Is still having the itching, matting & watery eyes. Advised patient that provider was out of the office today but he states that it is too much trouble getting out of the house to an urgent care & he would like to wait to hear back from PCP.  Please advise.

## 2019-01-13 ENCOUNTER — Telehealth: Payer: Self-pay

## 2019-01-13 MED ORDER — ERYTHROMYCIN 5 MG/GM OP OINT: 1.0000 "application " | TOPICAL_OINTMENT | Freq: Every day | OPHTHALMIC | 0 refills | Status: DC

## 2019-01-13 MED ORDER — OLOPATADINE HCL 0.1 % OP SOLN: 1.0000 [drp] | Freq: Two times a day (BID) | OPHTHALMIC | 12 refills | Status: DC

## 2019-01-13 NOTE — Telephone Encounter (Signed)
Patient notified of the following information. Expressed understanding. Will give [place] a call back if no better.

## 2019-01-13 NOTE — Telephone Encounter (Signed)
Referral received for home health RN/PT/OT.  Call placed to the patient to inquire if he has a preference for home health agencies. He stated that he has no preference. This CM explained that the agency will ned to be contracted with [facility] and he stated that he understood.  Call placed to [facility], spoke to [facility]. She stated that they are in network with [facility] and she requested that the referral be faxed to them for review.    Referral faxed to [facility] as instructed.

## 2019-01-13 NOTE — Telephone Encounter (Addendum)
Contact patient to advise he should continue a oral antibiotic. I am adding Patanol which are antihistamine eye drops which will hopefully help with eye itching. I am adding erythromycin eye ointment to be applied at bedtime x 10 days. If no improvement, I would recommend follow-up at ophthalmology for further evaluation. He call me if this second round of treatment doesn't improve symptoms.

## 2019-01-20 ENCOUNTER — Telehealth: Payer: Self-pay

## 2019-01-20 NOTE — Telephone Encounter (Signed)
Call placed to [facility], spoke to [place] who confirmed that the home health start of care was 01/18/2019.

## 2019-01-27 ENCOUNTER — Telehealth: Payer: Self-pay | Admitting: Family Medicine

## 2019-01-27 DIAGNOSIS — Z992 Dependence on renal dialysis: Secondary | ICD-10-CM

## 2019-01-27 DIAGNOSIS — N186 End stage renal disease: Secondary | ICD-10-CM

## 2019-01-27 DIAGNOSIS — R6889 Other general symptoms and signs: Secondary | ICD-10-CM

## 2019-01-27 DIAGNOSIS — I25810 Atherosclerosis of coronary artery bypass graft(s) without angina pectoris: Secondary | ICD-10-CM

## 2019-01-27 DIAGNOSIS — R600 Localized edema: Secondary | ICD-10-CM

## 2019-01-27 NOTE — Telephone Encounter (Signed)
Please advise 

## 2019-01-27 NOTE — Telephone Encounter (Signed)
Ok to give verbal orders and they can fax originals.

## 2019-01-27 NOTE — Telephone Encounter (Signed)
Attempted to call back to give verbal orders. Number listed below is a main number. Went through the prompts 3 times & each time was on hold about 8 mins & then the call disconnects.

## 2019-01-27 NOTE — Telephone Encounter (Signed)
Jeffrey [name] from [facility] called requesting verbal orders for occupational therapy 1 time a week for 1 week, 2 times a week for 3 weeks and 1 time a week for two weeks. She's also requesting physical therapy 1 time a week for 1 week, 2 times a week for 2 weeks, and then 1 time a week for 1 week. The last thing she requested was nursing 1 time a week for 1 week. Please follow up.

## 2019-01-28 NOTE — Telephone Encounter (Signed)
Please advise for compression wrap.

## 2019-01-28 NOTE — Telephone Encounter (Signed)
Please give verbal ok for compression wraps

## 2019-01-28 NOTE — Telephone Encounter (Signed)
Jeffrey [name] from [facility] called requesting verbal orders for a compression wrap. Please follow up.  Verified that they had received fax with verbal orders from yesterday 01/27/2019.

## 2019-01-29 ENCOUNTER — Ambulatory Visit (INDEPENDENT_AMBULATORY_CARE_PROVIDER_SITE_OTHER): Payer: Medicare Other | Admitting: *Deleted

## 2019-01-29 DIAGNOSIS — I442 Atrioventricular block, complete: Secondary | ICD-10-CM | POA: Diagnosis not present

## 2019-01-30 NOTE — Telephone Encounter (Signed)
Levada Dy called requesting room recommendations for the patient, requested to speak to the nurse. Please follow up.

## 2019-01-30 NOTE — Telephone Encounter (Signed)
Spoke with Levada Dy. 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  She would like to clean the R ankle wound with normal saline & apply medi-honey in the wound. She would like to use a film dressing for the L ankle wound.  Please advise.

## 2019-01-31 LAB — CUP PACEART REMOTE DEVICE CHECK
Date Time Interrogation Session: 20200307085335
Implantable Lead Implant Date: 20160815
Implantable Lead Implant Date: 20160815
Implantable Lead Location: 753859
Implantable Lead Location: 753860
Implantable Lead Model: 5076
Implantable Lead Model: 5076
Implantable Pulse Generator Implant Date: 20160815
MDC IDC PG SERIAL: 68600861
Pulse Gen Model: 394929

## 2019-02-01 NOTE — Telephone Encounter (Signed)
Verbal orders give per recommendations below. Advise also that patient is being referred to vascular. The ABI was completed after patient complained of post dialysis leg pain and I do not see that study was followed by a vascular specialist. Referral placed.

## 2019-02-01 NOTE — Addendum Note (Signed)
Addended by: Scot Jun on: 02/01/2019 10:35 AM   Modules accepted: Orders

## 2019-02-02 NOTE — Telephone Encounter (Signed)
Called to give Levada Dy the verbal orders. Originals will be faxed as well.

## 2019-02-04 ENCOUNTER — Ambulatory Visit: Payer: Medicare Other | Admitting: Family Medicine

## 2019-02-06 ENCOUNTER — Telehealth: Payer: Self-pay | Admitting: Family Medicine

## 2019-02-06 NOTE — Telephone Encounter (Signed)
I see patient has scheduled latest appointment with me and has not rescheduled.  We can try and schedule an appointment. Contact patient

## 2019-02-06 NOTE — Progress Notes (Signed)
Remote pacemaker transmission.   

## 2019-02-06 NOTE — Telephone Encounter (Signed)
Terry from [facility] called to inform provider that patient has low BP, low heart rate, shortness of breath, and chest pain. Patient refused to go to ER during dialysis but he refused and when told to go to by [facility] he also refused.

## 2019-02-09 ENCOUNTER — Encounter: Payer: Self-pay | Admitting: Cardiology

## 2019-02-09 NOTE — Telephone Encounter (Signed)
Patient scheduled appoitment

## 2019-02-12 ENCOUNTER — Ambulatory Visit: Payer: Medicare Other | Admitting: Family Medicine

## 2019-02-12 NOTE — Telephone Encounter (Signed)
Patient called to cancel appointment because of dialyses, said he would call back to reschedule.

## 2019-02-16 ENCOUNTER — Telehealth: Payer: Self-pay | Admitting: Family Medicine

## 2019-02-16 NOTE — Telephone Encounter (Signed)
Suzi Roots that patient really needs to be seen at the ER. He has had recent low blood pressures, chest pain, and shortness of breath over 1 week ago and is now experiencing nausea, vomiting, and diarrhea, he requires a higher level of care and I recommend ER Verbal orders okay once patient illness has resolved.  [name], FNP

## 2019-02-16 NOTE — Telephone Encounter (Signed)
Called Musella & notified her of the provider's advice. She states that she has had the discussion with patient & he is declining to go to the ED. She states that today was actually patient's last scheduled visit with skilled nursing. He hasn't had any chest pain since last week. He did go to dialysis on Saturday and they started him on Midodrine to raise his low BP readings. She states that she will reach out to patient & family again to try to get them to reconsider the ED visit.  Called Antony Haste & let him know that once illness is resolved PT/OT can be started.

## 2019-02-16 NOTE — Telephone Encounter (Signed)
Antony Haste from [facility] called requesting verbal orders for physical and occupational therapy for 1 time a week for 4 weeks for functional therapy.    Carrie from [facility] also called stating that patient has had vomiting, diarrhea, and has been nauseous for the last 24 hours. The best number to reach [place] at would be [phone]

## 2019-02-19 ENCOUNTER — Other Ambulatory Visit: Payer: Self-pay

## 2019-02-19 ENCOUNTER — Inpatient Hospital Stay (HOSPITAL_COMMUNITY)
Admission: EM | Admit: 2019-02-19 | Discharge: 2019-02-24 | DRG: 637 | Disposition: A | Discharge status: Home Health | Payer: Medicare Other | Attending: Internal Medicine | Admitting: Internal Medicine

## 2019-02-19 ENCOUNTER — Encounter (HOSPITAL_COMMUNITY): Payer: Self-pay | Admitting: Emergency Medicine

## 2019-02-19 DIAGNOSIS — I5043 Acute on chronic combined systolic (congestive) and diastolic (congestive) heart failure: Secondary | ICD-10-CM | POA: Diagnosis present

## 2019-02-19 DIAGNOSIS — Z953 Presence of xenogenic heart valve: Secondary | ICD-10-CM

## 2019-02-19 DIAGNOSIS — L03119 Cellulitis of unspecified part of limb: Secondary | ICD-10-CM

## 2019-02-19 DIAGNOSIS — I350 Nonrheumatic aortic (valve) stenosis: Secondary | ICD-10-CM | POA: Diagnosis present

## 2019-02-19 DIAGNOSIS — E1151 Type 2 diabetes mellitus with diabetic peripheral angiopathy without gangrene: Secondary | ICD-10-CM | POA: Diagnosis present

## 2019-02-19 DIAGNOSIS — L819 Disorder of pigmentation, unspecified: Secondary | ICD-10-CM

## 2019-02-19 DIAGNOSIS — E11628 Type 2 diabetes mellitus with other skin complications: Secondary | ICD-10-CM | POA: Diagnosis present

## 2019-02-19 DIAGNOSIS — I4891 Unspecified atrial fibrillation: Secondary | ICD-10-CM | POA: Diagnosis present

## 2019-02-19 DIAGNOSIS — Z955 Presence of coronary angioplasty implant and graft: Secondary | ICD-10-CM

## 2019-02-19 DIAGNOSIS — G609 Hereditary and idiopathic neuropathy, unspecified: Secondary | ICD-10-CM

## 2019-02-19 DIAGNOSIS — D509 Iron deficiency anemia, unspecified: Secondary | ICD-10-CM | POA: Diagnosis present

## 2019-02-19 DIAGNOSIS — I252 Old myocardial infarction: Secondary | ICD-10-CM

## 2019-02-19 DIAGNOSIS — N2581 Secondary hyperparathyroidism of renal origin: Secondary | ICD-10-CM | POA: Diagnosis present

## 2019-02-19 DIAGNOSIS — I132 Hypertensive heart and chronic kidney disease with heart failure and with stage 5 chronic kidney disease, or end stage renal disease: Secondary | ICD-10-CM | POA: Diagnosis present

## 2019-02-19 DIAGNOSIS — I10 Essential (primary) hypertension: Secondary | ICD-10-CM | POA: Diagnosis present

## 2019-02-19 DIAGNOSIS — Z9842 Cataract extraction status, left eye: Secondary | ICD-10-CM

## 2019-02-19 DIAGNOSIS — D631 Anemia in chronic kidney disease: Secondary | ICD-10-CM

## 2019-02-19 DIAGNOSIS — L03116 Cellulitis of left lower limb: Secondary | ICD-10-CM | POA: Diagnosis present

## 2019-02-19 DIAGNOSIS — I442 Atrioventricular block, complete: Secondary | ICD-10-CM | POA: Diagnosis present

## 2019-02-19 DIAGNOSIS — Z992 Dependence on renal dialysis: Secondary | ICD-10-CM

## 2019-02-19 DIAGNOSIS — Z8673 Personal history of transient ischemic attack (TIA), and cerebral infarction without residual deficits: Secondary | ICD-10-CM

## 2019-02-19 DIAGNOSIS — Z7901 Long term (current) use of anticoagulants: Secondary | ICD-10-CM

## 2019-02-19 DIAGNOSIS — Z87891 Personal history of nicotine dependence: Secondary | ICD-10-CM

## 2019-02-19 DIAGNOSIS — Z952 Presence of prosthetic heart valve: Secondary | ICD-10-CM

## 2019-02-19 DIAGNOSIS — N186 End stage renal disease: Secondary | ICD-10-CM

## 2019-02-19 DIAGNOSIS — I83009 Varicose veins of unspecified lower extremity with ulcer of unspecified site: Secondary | ICD-10-CM

## 2019-02-19 DIAGNOSIS — E1122 Type 2 diabetes mellitus with diabetic chronic kidney disease: Secondary | ICD-10-CM | POA: Diagnosis present

## 2019-02-19 DIAGNOSIS — I959 Hypotension, unspecified: Secondary | ICD-10-CM

## 2019-02-19 DIAGNOSIS — L97909 Non-pressure chronic ulcer of unspecified part of unspecified lower leg with unspecified severity: Secondary | ICD-10-CM

## 2019-02-19 DIAGNOSIS — R7989 Other specified abnormal findings of blood chemistry: Secondary | ICD-10-CM

## 2019-02-19 DIAGNOSIS — I25118 Atherosclerotic heart disease of native coronary artery with other forms of angina pectoris: Secondary | ICD-10-CM | POA: Diagnosis present

## 2019-02-19 DIAGNOSIS — Z79899 Other long term (current) drug therapy: Secondary | ICD-10-CM

## 2019-02-19 DIAGNOSIS — E872 Acidosis: Secondary | ICD-10-CM | POA: Diagnosis present

## 2019-02-19 DIAGNOSIS — F40240 Claustrophobia: Secondary | ICD-10-CM | POA: Diagnosis present

## 2019-02-19 DIAGNOSIS — Z950 Presence of cardiac pacemaker: Secondary | ICD-10-CM

## 2019-02-19 DIAGNOSIS — I5042 Chronic combined systolic (congestive) and diastolic (congestive) heart failure: Secondary | ICD-10-CM | POA: Diagnosis present

## 2019-02-19 DIAGNOSIS — D638 Anemia in other chronic diseases classified elsewhere: Secondary | ICD-10-CM | POA: Diagnosis present

## 2019-02-19 DIAGNOSIS — E11649 Type 2 diabetes mellitus with hypoglycemia without coma: Secondary | ICD-10-CM | POA: Diagnosis present

## 2019-02-19 DIAGNOSIS — Z9049 Acquired absence of other specified parts of digestive tract: Secondary | ICD-10-CM

## 2019-02-19 DIAGNOSIS — Z95810 Presence of automatic (implantable) cardiac defibrillator: Secondary | ICD-10-CM

## 2019-02-19 DIAGNOSIS — I878 Other specified disorders of veins: Secondary | ICD-10-CM | POA: Diagnosis present

## 2019-02-19 DIAGNOSIS — I70209 Unspecified atherosclerosis of native arteries of extremities, unspecified extremity: Secondary | ICD-10-CM

## 2019-02-19 DIAGNOSIS — Z7902 Long term (current) use of antithrombotics/antiplatelets: Secondary | ICD-10-CM

## 2019-02-19 DIAGNOSIS — Z961 Presence of intraocular lens: Secondary | ICD-10-CM | POA: Diagnosis present

## 2019-02-19 DIAGNOSIS — Z9841 Cataract extraction status, right eye: Secondary | ICD-10-CM

## 2019-02-19 DIAGNOSIS — N189 Chronic kidney disease, unspecified: Secondary | ICD-10-CM

## 2019-02-19 DIAGNOSIS — Z794 Long term (current) use of insulin: Secondary | ICD-10-CM

## 2019-02-19 DIAGNOSIS — M7121 Synovial cyst of popliteal space [Baker], right knee: Secondary | ICD-10-CM | POA: Diagnosis present

## 2019-02-19 NOTE — ED Provider Notes (Signed)
[facility] EMERGENCY DEPARTMENT Provider Note   CSN: [id number] Arrival date & time: 02/19/19  2246    History   Chief Complaint Chief Complaint  Patient presents with  . Foot Pain    HPI Jeffrey [name] is a 78 y.o. male.  The history is provided by the patient.  He has history of diabetes, hypertension, hyperlipidemia, end-stage renal disease on hemodialysis, COPD, combined systolic and diastolic heart failure, coronary artery disease, stroke and was told to come in to the ED because of discoloration of his left foot.  He denies any injury or pain.  Discoloration was noted at dialysis today.  Past Medical History:  Diagnosis Date  . Anginal pain ([place])   . Aortic stenosis    a. severe by echo 09/2014  . Atrial fibrillation ([place])    a. not well documented, not on anticoagulation  . CHF (congestive heart failure) ([place])    04/28/17 echo-EF 40%, mod LVH, diastolic dysfunction  . Claustrophobia   . Complete heart block ([place])   . COPD (chronic obstructive pulmonary disease) ([place])   . Coronary artery disease    a. chronically occluded RCA per cath 09/2014 with collaterals B. cath 05/01/17 chr occ RCA w/collaterals, 60-70% mid LAD,   . CVA (cerebral vascular accident) ([place]) 10/2014   denies residual on 07/11/2015  . ESRD (end stage renal disease) on dialysis [facility])    a. on dialysis; [facility]; MWF, LUE fistula (07/11/2015)  . History of blood transfusion    "related to gallbladder OR"  . History of stomach ulcers   . Hyperlipidemia   . Hypertension   . Iron deficiency anemia   . Myocardial infarction ([place]) 10/2014  . Peripheral vascular disease ([place])   . Pneumonia   . Presence of permanent cardiac pacemaker   . 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 Type II diabetes mellitus [facility])     Patient Active Problem List   Diagnosis Date Noted   . RSV (respiratory syncytial virus infection) 11/30/2018  . Troponin level elevated   . COPD exacerbation ([place]) 11/29/2018  . Chronic respiratory failure with hypoxia ([place]) 10/29/2018  . Respiratory distress 07/27/2018  . GERD (gastroesophageal reflux disease) 06/23/2018  . Cough 06/23/2018  . Chest pain 04/15/2018  . NSTEMI (non-ST elevated myocardial infarction) ([place]) 04/14/2018  . Rash 04/14/2018  . COPD GOLD 0 09/26/2017  . Atrial fibrillation ([place]) [I48.91] 05/21/2017  . Encounter for therapeutic drug monitoring 05/21/2017  . Obesity (BMI 30-39.9) 04/17/2017  . Hyponatremia 03/10/2017  . COPD with acute exacerbation ([place]) 03/10/2017  . Non-healing open wound of left groin 09/05/2015  . Automatic implantable cardioverter-defibrillator in situ 08/08/2015  . 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 Hypervolemia   . Chronic combined systolic and diastolic CHF (congestive heart failure) ([place]) 07/19/2015  . Fluid overload 07/18/2015  . Acute on chronic respiratory failure with hypoxia ([place]) 07/18/2015  . 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/2015  . Atherosclerosis of native coronary artery of native heart with angina pectoris ([place])   . Protein-calorie malnutrition, severe ([place]) 01/14/2015  . ESRD (end stage renal disease) on dialysis ([place]) 01/12/2015  . Mobitz type 1 second degree AV block   . Elevated troponin 09/30/2014  . History of stroke 09/29/2014  . Type 2 diabetes mellitus with chronic kidney disease on chronic dialysis, with long-term current use of  insulin ([place]) 09/29/2014  . CAD (coronary artery disease) 09/29/2014  . Essential hypertension   . History of tobacco use 11/21/2012  . Hyperlipidemia 01/19/2010  . Arthritis, degenerative 01/19/2010  . Allergic rhinitis 12/05/2009  . Idiopathic peripheral neuropathy 01/05/2009  . 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  . Absolute anemia 11/13/2007    Past  Surgical History:  Procedure Laterality Date  . AV FISTULA PLACEMENT Left 10/19/2014   Procedure: BRACHIOCEPHALIC ARTERIOVENOUS (AV) FISTULA CREATION ;  Surgeon: Conrad Hobart, MD;  Location: [facility];  Service: Vascular;  Laterality: Left;  . CARDIAC CATHETERIZATION    . CARDIAC CATHETERIZATION N/A 07/22/2015   Procedure: Right/Left Heart Cath and Coronary Angiography;  Surgeon: [name], MD;  Location: [facility] CV LAB;  Service: Cardiovascular;  Laterality: N/A;  . CATARACT EXTRACTION W/ INTRAOCULAR LENS  IMPLANT, BILATERAL Bilateral 1990's  . CHOLECYSTECTOMY OPEN  1980's  . COLONOSCOPY W/ BIOPSIES AND POLYPECTOMY    . CORONARY ANGIOGRAPHY N/A 07/31/2018   Procedure: CORONARY ANGIOGRAPHY;  Surgeon: Martinique, Peter M, MD;  Location: [place] CV LAB;  Service: Cardiovascular;  Laterality: N/A;  . CORONARY ANGIOPLASTY    . CORONARY ATHERECTOMY N/A 04/18/2018   Procedure: CORONARY ATHERECTOMY;  Surgeon: [name], MD;  Location: [place] CV LAB;  Service: Cardiovascular;  Laterality: N/A;  . CORONARY BALLOON ANGIOPLASTY N/A 07/31/2018   Procedure: CORONARY BALLOON ANGIOPLASTY;  Surgeon: Martinique, Peter M, MD;  Location: [facility] CV LAB;  Service: Cardiovascular;  Laterality: N/A;  . CORONARY STENT INTERVENTION N/A 04/18/2018   Procedure: CORONARY STENT INTERVENTION;  Surgeon: [name], MD;  Location: [facility] CV LAB;  Service: Cardiovascular;  Laterality: N/A;  . EP IMPLANTABLE DEVICE N/A 07/11/2015   Procedure: Pacemaker Implant;  Surgeon: [name], MD;  Location: [facility] CV LAB;  Service: Cardiovascular;  Laterality: N/A;  . ESOPHAGOGASTRODUODENOSCOPY  08/01/2012   Procedure: ESOPHAGOGASTRODUODENOSCOPY (EGD);  Surgeon: [name], MD;  Location: [facility];  Service: Endoscopy;  Laterality: N/A;  . INSERT / REPLACE / REMOVE PACEMAKER  07/11/2015  . INSERTION OF DIALYSIS CATHETER Right 02/02/2015   Procedure: INSERTION OF DIALYSIS  CATHETER  RIGHT INTERNAL JUGULAR;  Surgeon: Mal [name], MD;  Location: [facility];  Service: Vascular;  Laterality: Right;  . LEFT AND RIGHT HEART CATHETERIZATION WITH CORONARY ANGIOGRAM N/A 09/30/2014   Procedure: LEFT AND RIGHT HEART CATHETERIZATION WITH CORONARY ANGIOGRAM;  Surgeon: [name], MD;  Location: [facility] CATH LAB;  Service: Cardiovascular;  Laterality: N/A;  . LEFT HEART CATH AND CORONARY ANGIOGRAPHY N/A 04/14/2018   Procedure: LEFT HEART CATH AND CORONARY ANGIOGRAPHY;  Surgeon: [name], MD;  Location: [place] CV LAB;  Service: Cardiovascular;  Laterality: N/A;  . TEE WITHOUT CARDIOVERSION N/A 08/02/2015   Procedure: TRANSESOPHAGEAL ECHOCARDIOGRAM (TEE);  Surgeon: [name], MD;  Location: [facility];  Service: Open Heart Surgery;  Laterality: N/A;  . TONSILLECTOMY    . TRANSCATHETER AORTIC VALVE REPLACEMENT, TRANSFEMORAL Left 08/02/2015   Procedure: TRANSCATHETER AORTIC VALVE REPLACEMENT, TRANSFEMORAL;  Surgeon: [name], MD;  Location: [facility];  Service: Open Heart Surgery;  Laterality: Left;        Home Medications    Prior to Admission medications   Medication Sig Start Date End Date Taking? Authorizing Provider  albuterol (PROVENTIL HFA;VENTOLIN HFA) 108 (90 Base) MCG/ACT inhaler Inhale 2 puffs into the lungs every 4 (four) hours as needed for wheezing or shortness of breath. 11/27/18   Tanda Rockers,  MD  albuterol (PROVENTIL) (2.5 MG/3ML) 0.083% nebulizer solution Take 3 mLs (2.5 mg total) by nebulization every 6 (six) hours as needed for wheezing or shortness of breath. 01/07/19   Scot Jun, FNP  apixaban (ELIQUIS) 5 MG TABS tablet Take 1 tablet (5 mg total) by mouth 2 (two) times daily with a meal. 09/02/18   Deboraha Sprang, MD  atorvastatin (LIPITOR) 80 MG tablet Take 1 tablet (80 mg total) by mouth daily at 6 PM. 01/07/19   Scot Jun, FNP  budesonide-formoterol (SYMBICORT) 160-4.5 MCG/ACT inhaler Inhale 2 puffs into the lungs 2 (two) times  daily. 11/27/18   Tanda Rockers, MD  cinacalcet (SENSIPAR) 30 MG tablet Take 30 mg by mouth daily with supper.    [provider]  clopidogrel (PLAVIX) 75 MG tablet Take 1 tablet (75 mg total) by mouth daily. 09/02/18   Martinique, Peter M, MD  erythromycin ophthalmic ointment Place 1 application into the right eye at bedtime. 01/13/19   Scot Jun, FNP  guaiFENesin (MUCINEX) 600 MG 12 hr tablet Take 1 tablet (600 mg total) by mouth 2 (two) times daily. 01/07/19   Scot Jun, FNP  hydrOXYzine (ATARAX/VISTARIL) 25 MG tablet Take 0.5-1 tablets (12.5-25 mg total) by mouth 2 (two) times daily as needed for itching. 01/07/19   Scot Jun, FNP  insulin detemir (LEVEMIR) 100 UNIT/ML injection Inject 0.48 mLs (48 Units total) into the skin at bedtime. 01/07/19   Scot Jun, FNP  Insulin Pen Needle 31G X 6 MM MISC USE 1  THREE TIMES DAILY 08/28/18   [provider]  lidocaine-prilocaine (EMLA) cream Apply 1 application topically once as needed (prior to accessing port).     [provider]  loratadine (CLARITIN) 10 MG tablet Take 10 mg by mouth daily.    [provider]  metoprolol tartrate (LOPRESSOR) 25 MG tablet One half twice daily Patient taking differently: Take 12.5 mg by mouth 2 (two) times daily.  11/27/18   Tanda Rockers, MD  nitroGLYCERIN (NITROSTAT) 0.4 MG SL tablet Place 1 tablet (0.4 mg total) under the tongue every 5 (five) minutes as needed for chest pain. 04/19/18   Rai, Ripudeep K, MD  olopatadine (PATANOL) 0.1 % ophthalmic solution Place 1 drop into the right eye 2 (two) times daily. 01/13/19   Scot Jun, FNP  OXYGEN Inhale into the lungs. 2-3 lpm 24/7    [provider]  pantoprazole (PROTONIX) 40 MG tablet Take 1 tablet (40 mg total) by mouth daily. 08/29/18   Deboraha Sprang, MD  sevelamer carbonate (RENVELA) 800 MG tablet Take 1,600-4,000 mg by mouth See admin instructions. Take 5 tablets by mouth 3 times daily  with meals and then 2 tablets twice daily with snacks     [provider]  triamcinolone cream (KENALOG) 0.1 % Apply 1 application topically 2 (two) times daily. 01/07/19   Scot Jun, FNP    Family History Family History  Problem Relation Age of Onset  . Diabetes Father   . Heart disease Father   . Diabetes Sister   . Alzheimer's disease Mother   . Stroke Mother   . Diabetes Paternal Grandmother   . Alzheimer's disease Maternal Aunt        x 8 Maternal Aunts  . Cancer Maternal Uncle        type unknown    Social History Social History   Tobacco Use  . Smoking status: Former Smoker  Packs/day: 2.00    Years: 32.00    Pack years: 64.00    Last attempt to quit: 11/27/1983    Years since quitting: 35.2  . Smokeless tobacco: Never Used  Substance Use Topics  . Alcohol use: Yes    Comment: rarely  . Drug use: No     Allergies   Byetta 10 mcg pen [exenatide]; Codeine; and Coumadin [warfarin sodium]   Review of Systems Review of Systems  All other systems reviewed and are negative.    Physical Exam Updated Vital Signs BP (!) 91/47 (BP Location: Right Arm)   Pulse (!) 54   Temp 97.7 F (36.5 C) (Oral)   Resp 16   SpO2 97%   Physical Exam Vitals signs and nursing note reviewed.    78 year old male, resting comfortably and in no acute distress. Vital signs are significant for slow heart rate and low blood pressure. Oxygen saturation is 97%, which is normal. Head is normocephalic and atraumatic. PERRLA, EOMI. Oropharynx is clear. Neck is nontender and supple without adenopathy or JVD. Back is nontender and there is no CVA tenderness. Lungs are clear without rales, wheezes, or rhonchi. Chest is nontender. Heart has regular rate and rhythm with 1-9/6 systolic ejection murmur best heard in the aortic area. Abdomen is soft, flat, nontender without masses or hepatosplenomegaly and peristalsis is normoactive. Extremities have 2+ edema with moderate  venous stasis changes.  There is a bluish discoloration of the second, third, fourth toes on the left with a general appearance of ecchymosis rather than cyanosis.  Capillary refill is slightly delayed to 4 seconds, but is symmetric.  The foot is warm to the touch.  Dorsalis pedis pulses are not palpable but are easily obtained via Doppler bilaterally. Skin is warm and dry without rash. Neurologic: Mental status is normal, cranial nerves are intact, there are no motor deficits.  There is evidence of peripheral neuropathy with absent sensation distal to the mid thigh bilaterally.  ED Treatments / Results  Labs (all labs ordered are listed, but only abnormal results are displayed) Labs Reviewed  BASIC METABOLIC PANEL - Abnormal; Notable for the following components:      Result Value   Chloride 94 (*)    Glucose, Bld 115 (*)    Creatinine, Ser 3.27 (*)    Calcium 8.6 (*)    GFR calc non Af Amer 17 (*)    GFR calc Af Amer 20 (*)    All other components within normal limits  CBC WITH DIFFERENTIAL/PLATELET - Abnormal; Notable for the following components:   RBC 3.60 (*)    Hemoglobin 10.2 (*)    HCT 34.6 (*)    MCHC 29.5 (*)    RDW 17.0 (*)    Monocytes Absolute 1.4 (*)    All other components within normal limits  LACTIC ACID, PLASMA - Abnormal; Notable for the following components:   Lactic Acid, Venous 2.5 (*)    All other components within normal limits  LACTIC ACID, PLASMA - Abnormal; Notable for the following components:   Lactic Acid, Venous 3.3 (*)    All other components within normal limits  CBC WITH DIFFERENTIAL/PLATELET  COMPREHENSIVE METABOLIC PANEL  LACTIC ACID, PLASMA  LACTIC ACID, PLASMA  APTT  HEPARIN LEVEL (UNFRACTIONATED)  PROTIME-INR    Radiology Ct Angio Low Extrem Left W &/or Wo Contrast  Result Date: 02/20/2019 CLINICAL DATA:  Cold left foot, sudden onset, vascular compromise suspected. There is also history of diabetes with left-sided  foot discoloration  becoming worse over 2 months. EXAM: CT ANGIOGRAPHY LOWER LEFT EXTREMITY TECHNIQUE: Arterially timed CTA of the left lower extremity was performed after bolus administration of intravenous contrast. CONTRAST:  145mL OMNIPAQUE IOHEXOL 350 MG/ML SOLN COMPARISON:  None similar FINDINGS: Iliacs: Atherosclerotic calcification without dissection or flow limiting stenosis. Common femoral: Atherosclerosis without flow limiting stenosis or dissection. Superficial femoral artery: Heavily calcified main trunk and multiple orders of downstream branches which are not well evaluated due to the degree of calcified plaque blooming relative to the lumen size. There is up to 70% atheromatous stenosis in the distal SFA due to bulky calcified plaque. No acute occlusion is seen. Popliteal: Heavily calcified wall with diffuse mild to moderate narrowing. Leg: The tibioperoneal trunk and proximal peroneal artery is patent. Below the upper calf vessels are heavily calcified such that luminal density is not confidently measurable. There is no definite flow within branches of the foot. Nonvascular: Fatty left inguinal hernia. Enlarged prostate with bladder wall thickening. Small volume pelvic ascites. Diffuse subcutaneous edema in the left lower extremity without soft tissue gas or abscess. No bony erosion or joint destruction. Advanced knee osteoarthritis. Review of the MIP images confirms the above findings. IMPRESSION: Heavily and diffusely calcified arteries which precludes luminal visualization beyond the upper calf. No acute occlusion is seen. There is extensive moderate to advanced atheromatous narrowing of the SFA and popliteal arteries. Electronically Signed   By: Monte Fantasia M.D.   On: 02/20/2019 04:29   Dg Chest Port 1 View  Result Date: 02/20/2019 CLINICAL DATA:  Initial evaluation for elevated lactate. EXAM: PORTABLE CHEST 1 VIEW COMPARISON:  Prior radiograph from 11/29/2018 FINDINGS: Right-sided pacemaker/AICD in place.  Advanced cardiomegaly, stable. Mediastinal silhouette normal. Aortic atherosclerosis. Lungs mildly hypoinflated. Diffuse pulmonary vascular and interstitial congestion, consistent with mild diffuse pulmonary interstitial edema. No consolidative space opacity. No pneumothorax. Probable trace right pleural effusion. Osseous structures within normal limits. IMPRESSION: 1. Cardiomegaly with mild diffuse pulmonary interstitial edema and small right pleural effusion. 2. Aortic atherosclerosis. Electronically Signed   By: Jeannine Boga M.D.   On: 02/20/2019 03:54   Dg Foot Complete Left  Result Date: 02/20/2019 CLINICAL DATA:  Bruising and discoloration for 2 days. EXAM: LEFT FOOT - COMPLETE 3+ VIEW COMPARISON:  Left foot radiograph 01/13/2009 FINDINGS: Hammertoe deformity of the digits which limits assessment. No fracture, dislocation, or bony destructive change. Midfoot osteoarthritis. Probable remote fracture of the third proximal metatarsal, unchanged in appearance from prior exam. Small plantar calcaneal spur. There are dense vascular calcifications. Soft tissue edema noted about the dorsum of the foot. No soft tissue air. IMPRESSION: 1. Dorsal soft tissue edema.  No acute osseous abnormality. 2. Midfoot osteoarthritis. Hammertoe deformity of the digits limits assessment. Electronically Signed   By: Keith Rake M.D.   On: 02/20/2019 00:25    Procedures Procedures  CRITICAL CARE Performed by: Delora Fuel Total critical care time: 40 minutes Critical care time was exclusive of separately billable procedures and treating other patients. Critical care was necessary to treat or prevent imminent or life-threatening deterioration. Critical care was time spent personally by me on the following activities: development of treatment plan with patient and/or surrogate as well as nursing, discussions with consultants, evaluation of patient's response to treatment, examination of patient, obtaining history  from patient or surrogate, ordering and performing treatments and interventions, ordering and review of laboratory studies, ordering and review of radiographic studies, pulse oximetry and re-evaluation of patient's condition.  Medications Ordered in ED Medications  atorvastatin (LIPITOR) tablet 80 mg (has no administration in time range)  metoprolol tartrate (LOPRESSOR) tablet 12.5 mg (has no administration in time range)  nitroGLYCERIN (NITROSTAT) SL tablet 0.4 mg (has no administration in time range)  hydrOXYzine (ATARAX/VISTARIL) tablet 12.5-25 mg (has no administration in time range)  cinacalcet (SENSIPAR) tablet 30 mg (has no administration in time range)  insulin detemir (LEVEMIR) injection 48 Units (has no administration in time range)  pantoprazole (PROTONIX) EC tablet 40 mg (has no administration in time range)  sevelamer carbonate (RENVELA) tablet 1,600-4,000 mg (has no administration in time range)  clopidogrel (PLAVIX) tablet 75 mg (has no administration in time range)  albuterol (PROVENTIL HFA;VENTOLIN HFA) 108 (90 Base) MCG/ACT inhaler 2 puff (has no administration in time range)  albuterol (PROVENTIL) (2.5 MG/3ML) 0.083% nebulizer solution 2.5 mg (has no administration in time range)  mometasone-formoterol (DULERA) 200-5 MCG/ACT inhaler 2 puff (has no administration in time range)  guaiFENesin (MUCINEX) 12 hr tablet 600 mg (has no administration in time range)  loratadine (CLARITIN) tablet 10 mg (has no administration in time range)  olopatadine (PATANOL) 0.1 % ophthalmic solution 1 drop (has no administration in time range)  acetaminophen (TYLENOL) tablet 650 mg (has no administration in time range)    Or  acetaminophen (TYLENOL) suppository 650 mg (has no administration in time range)  ondansetron (ZOFRAN) tablet 4 mg (has no administration in time range)    Or  ondansetron (ZOFRAN) injection 4 mg (has no administration in time range)  insulin aspart (novoLOG) injection 0-9  Units (has no administration in time range)  sodium chloride 0.9 % bolus 1,000 mL (0 mLs Intravenous Stopped 02/20/19 0435)  iohexol (OMNIPAQUE) 350 MG/ML injection 100 mL (100 mLs Intravenous Contrast Given 02/20/19 0330)     Initial Impression / Assessment and Plan / ED Course  I have reviewed the triage vital signs and the nursing notes.  Pertinent labs & imaging results that were available during my care of the patient were reviewed by me and considered in my medical decision making (see chart for details).  Discoloration of the left foot which I believe is actually bruising rather than vascular compromise.  Will check screening labs and x-ray.  Old records are reviewed, and it is noted that he is status post TAVR and status post permanent pacemaker.  In January, he did have abnormal ABI of 2.63, but it was symmetric.  Labs are significant for known renal failure, and stable anemia which is presumably secondary to renal failure.  Lactic acid level is come back elevated at 2.5.  While this may be at, at least partly, related to renal failure, it is noted on old records that he has had normal lactic acid levels in the recent past.  This is not felt to be due to to sepsis, probably secondary to peripheral vascular disease.  He was given 1 L of normal saline, and lactic acid level actually increased.  Chest x-ray was obtained showing cardiomegaly and probable mild pulmonary edema, even though he was maintaining excellent oxygen saturations.  He is sent for CT angiogram which showed diffuse atherosclerotic disease, but no evidence of any occlusion.  I was hesitant to give additional fluids given physical exam showing evidence of fluid overload and mild pulmonary edema present on chest x-ray.  He continued to be nontoxic in appearance.  Case is discussed with Dr.  Hal Hope of [facility] hospitalist, who agrees to see the patient.  He probably should have vascular surgery consult, although I  do not feel there  are any acute interventions that would be indicated.  Final Clinical Impressions(s) / ED Diagnoses   Final diagnoses:  Elevated lactic acid level  Discoloration of skin of foot  Atherosclerotic peripheral vascular disease (HCC)  End-stage renal disease on hemodialysis (HCC)  Anemia associated with chronic renal failure  Idiopathic peripheral neuropathy    ED Discharge Orders    None       Delora Fuel, MD 49/82/64 [phone]

## 2019-02-19 NOTE — ED Triage Notes (Signed)
Pt sent by doctor, c/o left toe/foot discoloration x 2 days. Unsure of injury, hx diabetes and neuropathy.

## 2019-02-20 ENCOUNTER — Emergency Department (HOSPITAL_COMMUNITY): Payer: Medicare Other

## 2019-02-20 ENCOUNTER — Encounter (HOSPITAL_COMMUNITY): Payer: Medicare Other

## 2019-02-20 ENCOUNTER — Encounter (HOSPITAL_COMMUNITY): Payer: Self-pay | Admitting: Internal Medicine

## 2019-02-20 ENCOUNTER — Observation Stay (HOSPITAL_COMMUNITY): Payer: Medicare Other

## 2019-02-20 DIAGNOSIS — L819 Disorder of pigmentation, unspecified: Secondary | ICD-10-CM | POA: Diagnosis not present

## 2019-02-20 DIAGNOSIS — I4821 Permanent atrial fibrillation: Secondary | ICD-10-CM

## 2019-02-20 DIAGNOSIS — N186 End stage renal disease: Secondary | ICD-10-CM

## 2019-02-20 DIAGNOSIS — Z794 Long term (current) use of insulin: Secondary | ICD-10-CM

## 2019-02-20 DIAGNOSIS — Z992 Dependence on renal dialysis: Secondary | ICD-10-CM | POA: Diagnosis not present

## 2019-02-20 DIAGNOSIS — E1122 Type 2 diabetes mellitus with diabetic chronic kidney disease: Secondary | ICD-10-CM

## 2019-02-20 DIAGNOSIS — M79672 Pain in left foot: Secondary | ICD-10-CM

## 2019-02-20 DIAGNOSIS — M79671 Pain in right foot: Secondary | ICD-10-CM

## 2019-02-20 LAB — CBC WITH DIFFERENTIAL/PLATELET
Abs Immature Granulocytes: 0.03 10*3/uL (ref 0.00–0.07)
Abs Immature Granulocytes: 0.04 10*3/uL (ref 0.00–0.07)
BASOS ABS: 0.1 10*3/uL (ref 0.0–0.1)
Basophils Absolute: 0.1 10*3/uL (ref 0.0–0.1)
Basophils Relative: 1 %
Basophils Relative: 1 %
EOS ABS: 0.3 10*3/uL (ref 0.0–0.5)
Eosinophils Absolute: 0.4 10*3/uL (ref 0.0–0.5)
Eosinophils Relative: 3 %
Eosinophils Relative: 4 %
HCT: 33.7 % — ABNORMAL LOW (ref 39.0–52.0)
HCT: 34.6 % — ABNORMAL LOW (ref 39.0–52.0)
Hemoglobin: 10.2 g/dL — ABNORMAL LOW (ref 13.0–17.0)
Hemoglobin: 10.2 g/dL — ABNORMAL LOW (ref 13.0–17.0)
Immature Granulocytes: 0 %
Immature Granulocytes: 0 %
Lymphocytes Relative: 11 %
Lymphocytes Relative: 12 %
Lymphs Abs: 1.1 10*3/uL (ref 0.7–4.0)
Lymphs Abs: 1.1 10*3/uL (ref 0.7–4.0)
MCH: 28.3 pg (ref 26.0–34.0)
MCH: 29.7 pg (ref 26.0–34.0)
MCHC: 29.5 g/dL — ABNORMAL LOW (ref 30.0–36.0)
MCHC: 30.3 g/dL (ref 30.0–36.0)
MCV: 96.1 fL (ref 80.0–100.0)
MCV: 98.3 fL (ref 80.0–100.0)
Monocytes Absolute: 1.3 10*3/uL — ABNORMAL HIGH (ref 0.1–1.0)
Monocytes Absolute: 1.4 10*3/uL — ABNORMAL HIGH (ref 0.1–1.0)
Monocytes Relative: 14 %
Monocytes Relative: 15 %
NEUTROS ABS: 6.4 10*3/uL (ref 1.7–7.7)
Neutro Abs: 6.7 10*3/uL (ref 1.7–7.7)
Neutrophils Relative %: 68 %
Neutrophils Relative %: 71 %
Platelets: 197 10*3/uL (ref 150–400)
Platelets: 201 10*3/uL (ref 150–400)
RBC: 3.43 MIL/uL — ABNORMAL LOW (ref 4.22–5.81)
RBC: 3.6 MIL/uL — ABNORMAL LOW (ref 4.22–5.81)
RDW: 17 % — ABNORMAL HIGH (ref 11.5–15.5)
RDW: 17.2 % — ABNORMAL HIGH (ref 11.5–15.5)
WBC: 9.3 10*3/uL (ref 4.0–10.5)
WBC: 9.4 10*3/uL (ref 4.0–10.5)
nRBC: 0 % (ref 0.0–0.2)
nRBC: 0 % (ref 0.0–0.2)

## 2019-02-20 LAB — COMPREHENSIVE METABOLIC PANEL
ALT: 17 U/L (ref 0–44)
ANION GAP: 17 — AB (ref 5–15)
AST: 24 U/L (ref 15–41)
Albumin: 2.9 g/dL — ABNORMAL LOW (ref 3.5–5.0)
Alkaline Phosphatase: 92 U/L (ref 38–126)
BUN: 20 mg/dL (ref 8–23)
CO2: 24 mmol/L (ref 22–32)
Calcium: 8.5 mg/dL — ABNORMAL LOW (ref 8.9–10.3)
Chloride: 94 mmol/L — ABNORMAL LOW (ref 98–111)
Creatinine, Ser: 3.59 mg/dL — ABNORMAL HIGH (ref 0.61–1.24)
GFR calc non Af Amer: 15 mL/min — ABNORMAL LOW (ref >60)
GFR, EST AFRICAN AMERICAN: 17 mL/min — AB (ref >60)
Glucose, Bld: 167 mg/dL — ABNORMAL HIGH (ref 70–99)
Potassium: 4.7 mmol/L (ref 3.5–5.1)
Sodium: 135 mmol/L (ref 135–145)
Total Bilirubin: 1.6 mg/dL — ABNORMAL HIGH (ref 0.3–1.2)
Total Protein: 6.3 g/dL — ABNORMAL LOW (ref 6.5–8.1)

## 2019-02-20 LAB — LACTIC ACID, PLASMA
LACTIC ACID, VENOUS: 2.8 mmol/L — AB (ref 0.5–1.9)
Lactic Acid, Venous: 2.5 mmol/L (ref 0.5–1.9)
Lactic Acid, Venous: 3.3 mmol/L (ref 0.5–1.9)
Lactic Acid, Venous: 2.8 mmol/L (ref 0.5–1.9)

## 2019-02-20 LAB — BASIC METABOLIC PANEL
Anion gap: 15 (ref 5–15)
BUN: 16 mg/dL (ref 8–23)
CHLORIDE: 94 mmol/L — AB (ref 98–111)
CO2: 26 mmol/L (ref 22–32)
Calcium: 8.6 mg/dL — ABNORMAL LOW (ref 8.9–10.3)
Creatinine, Ser: 3.27 mg/dL — ABNORMAL HIGH (ref 0.61–1.24)
GFR calc Af Amer: 20 mL/min — ABNORMAL LOW (ref >60)
GFR calc non Af Amer: 17 mL/min — ABNORMAL LOW (ref >60)
Glucose, Bld: 115 mg/dL — ABNORMAL HIGH (ref 70–99)
Potassium: 4.3 mmol/L (ref 3.5–5.1)
Sodium: 135 mmol/L (ref 135–145)

## 2019-02-20 LAB — PROTIME-INR
INR: 2.4 — ABNORMAL HIGH (ref 0.8–1.2)
Prothrombin Time: 25.6 seconds — ABNORMAL HIGH (ref 11.4–15.2)

## 2019-02-20 LAB — APTT: APTT: 51 s — AB (ref 24–36)

## 2019-02-20 LAB — GLUCOSE, CAPILLARY
Glucose-Capillary: 159 mg/dL — ABNORMAL HIGH (ref 70–99)
Glucose-Capillary: 138 mg/dL — ABNORMAL HIGH (ref 70–99)
Glucose-Capillary: 122 mg/dL — ABNORMAL HIGH (ref 70–99)
Glucose-Capillary: 115 mg/dL — ABNORMAL HIGH (ref 70–99)

## 2019-02-20 LAB — HEPARIN LEVEL (UNFRACTIONATED): Heparin Unfractionated: >2.2 IU/mL — ABNORMAL HIGH (ref 0.30–0.70)

## 2019-02-20 LAB — MRSA PCR SCREENING: MRSA by PCR: NEGATIVE

## 2019-02-20 MED ORDER — SEVELAMER CARBONATE 800 MG PO TABS
4000.0000 mg | ORAL_TABLET | Freq: Three times a day (TID) | ORAL | Status: DC
  Administered 2019-02-20 – 2019-02-22 (×6): 4000 mg via ORAL
  Filled 2019-02-20 (×8): qty 5

## 2019-02-20 MED ORDER — APIXABAN 2.5 MG PO TABS
2.5000 mg | ORAL_TABLET | Freq: Two times a day (BID) | ORAL | Status: DC
  Administered 2019-02-20 – 2019-02-24 (×8): 2.5 mg via ORAL
  Filled 2019-02-20 (×8): qty 1

## 2019-02-20 MED ORDER — SODIUM CHLORIDE 0.9 % IV SOLN
1.0000 g | INTRAVENOUS | Status: DC
  Administered 2019-02-20: 1 g via INTRAVENOUS
  Filled 2019-02-20 (×2): qty 1

## 2019-02-20 MED ORDER — INSULIN ASPART 100 UNIT/ML ~~LOC~~ SOLN
0.0000 [IU] | Freq: Three times a day (TID) | SUBCUTANEOUS | Status: DC
  Administered 2019-02-20: 2 [IU] via SUBCUTANEOUS
  Administered 2019-02-20 (×2): 1 [IU] via SUBCUTANEOUS

## 2019-02-20 MED ORDER — PANTOPRAZOLE SODIUM 40 MG PO TBEC
40.0000 mg | DELAYED_RELEASE_TABLET | Freq: Every day | ORAL | Status: DC
  Administered 2019-02-20 – 2019-02-24 (×5): 40 mg via ORAL
  Filled 2019-02-20 (×5): qty 1

## 2019-02-20 MED ORDER — GUAIFENESIN ER 600 MG PO TB12
600.0000 mg | ORAL_TABLET | Freq: Two times a day (BID) | ORAL | Status: DC
  Administered 2019-02-20 – 2019-02-24 (×8): 600 mg via ORAL
  Filled 2019-02-20 (×8): qty 1

## 2019-02-20 MED ORDER — METRONIDAZOLE IN NACL 5-0.79 MG/ML-% IV SOLN
500.0000 mg | Freq: Three times a day (TID) | INTRAVENOUS | Status: DC
  Administered 2019-02-20 – 2019-02-22 (×4): 500 mg via INTRAVENOUS
  Filled 2019-02-20 (×6): qty 100

## 2019-02-20 MED ORDER — SODIUM CHLORIDE 0.9 % IV SOLN: 100.0000 mL | INTRAVENOUS | Status: DC | PRN

## 2019-02-20 MED ORDER — SODIUM CHLORIDE 0.9 % IV BOLUS
1000.0000 mL | Freq: Once | INTRAVENOUS | Status: AC
  Administered 2019-02-20: 1000 mL via INTRAVENOUS

## 2019-02-20 MED ORDER — MIDODRINE HCL 5 MG PO TABS
10.0000 mg | ORAL_TABLET | Freq: Once | ORAL | Status: AC
  Administered 2019-02-20: 10 mg via ORAL

## 2019-02-20 MED ORDER — NITROGLYCERIN 0.4 MG SL SUBL
0.4000 mg | SUBLINGUAL_TABLET | SUBLINGUAL | Status: DC | PRN
  Administered 2019-02-23: 0.4 mg via SUBLINGUAL
  Filled 2019-02-20: qty 1

## 2019-02-20 MED ORDER — ONDANSETRON HCL 4 MG/2ML IJ SOLN: 4.0000 mg | Freq: Four times a day (QID) | INTRAMUSCULAR | Status: DC | PRN

## 2019-02-20 MED ORDER — HYDROXYZINE HCL 25 MG PO TABS
12.5000 mg | ORAL_TABLET | Freq: Two times a day (BID) | ORAL | Status: DC | PRN
  Administered 2019-02-20: 25 mg via ORAL
  Filled 2019-02-20: qty 1

## 2019-02-20 MED ORDER — ALBUTEROL SULFATE HFA 108 (90 BASE) MCG/ACT IN AERS: 2.0000 | INHALATION_SPRAY | RESPIRATORY_TRACT | Status: DC | PRN

## 2019-02-20 MED ORDER — SEVELAMER CARBONATE 800 MG PO TABS: 1600.0000 mg | ORAL_TABLET | Freq: Two times a day (BID) | ORAL | Status: DC | PRN

## 2019-02-20 MED ORDER — INSULIN DETEMIR 100 UNIT/ML ~~LOC~~ SOLN
48.0000 [IU] | Freq: Every day | SUBCUTANEOUS | Status: DC
  Administered 2019-02-20: 48 [IU] via SUBCUTANEOUS
  Filled 2019-02-20: qty 0.48

## 2019-02-20 MED ORDER — APIXABAN 5 MG PO TABS: 5.0000 mg | ORAL_TABLET | Freq: Two times a day (BID) | ORAL | Status: DC

## 2019-02-20 MED ORDER — CHLORHEXIDINE GLUCONATE CLOTH 2 % EX PADS: 6.0000 | MEDICATED_PAD | Freq: Every day | CUTANEOUS | Status: DC

## 2019-02-20 MED ORDER — ALTEPLASE 2 MG IJ SOLR
2.0000 mg | Freq: Once | INTRAMUSCULAR | Status: DC | PRN
  Filled 2019-02-20: qty 2

## 2019-02-20 MED ORDER — OLOPATADINE HCL 0.1 % OP SOLN
1.0000 [drp] | Freq: Two times a day (BID) | OPHTHALMIC | Status: DC
  Administered 2019-02-20 – 2019-02-23 (×4): 1 [drp] via OPHTHALMIC
  Filled 2019-02-20: qty 5

## 2019-02-20 MED ORDER — LIDOCAINE HCL (PF) 1 % IJ SOLN: 5.0000 mL | INTRAMUSCULAR | Status: DC | PRN

## 2019-02-20 MED ORDER — ONDANSETRON HCL 4 MG PO TABS
4.0000 mg | ORAL_TABLET | Freq: Four times a day (QID) | ORAL | Status: DC | PRN
  Filled 2019-02-20: qty 1

## 2019-02-20 MED ORDER — ACETAMINOPHEN 650 MG RE SUPP: 650.0000 mg | Freq: Four times a day (QID) | RECTAL | Status: DC | PRN

## 2019-02-20 MED ORDER — MIDODRINE HCL 5 MG PO TABS
ORAL_TABLET | ORAL | Status: AC
  Administered 2019-02-20: 10 mg via ORAL
  Filled 2019-02-20: qty 2

## 2019-02-20 MED ORDER — PENTAFLUOROPROP-TETRAFLUOROETH EX AERO: 1.0000 "application " | INHALATION_SPRAY | CUTANEOUS | Status: DC | PRN

## 2019-02-20 MED ORDER — ACETAMINOPHEN 325 MG PO TABS
650.0000 mg | ORAL_TABLET | Freq: Four times a day (QID) | ORAL | Status: DC | PRN
  Administered 2019-02-23: 650 mg via ORAL
  Filled 2019-02-20: qty 2

## 2019-02-20 MED ORDER — MOMETASONE FURO-FORMOTEROL FUM 200-5 MCG/ACT IN AERO
2.0000 | INHALATION_SPRAY | Freq: Two times a day (BID) | RESPIRATORY_TRACT | Status: DC
  Administered 2019-02-20 – 2019-02-23 (×6): 2 via RESPIRATORY_TRACT
  Filled 2019-02-20: qty 8.8

## 2019-02-20 MED ORDER — DOXERCALCIFEROL 4 MCG/2ML IV SOLN
1.0000 ug | INTRAVENOUS | Status: DC
  Administered 2019-02-21 – 2019-02-24 (×2): 1 ug via INTRAVENOUS
  Filled 2019-02-20 (×2): qty 2

## 2019-02-20 MED ORDER — LORATADINE 10 MG PO TABS
10.0000 mg | ORAL_TABLET | Freq: Every day | ORAL | Status: DC
  Administered 2019-02-20 – 2019-02-24 (×5): 10 mg via ORAL
  Filled 2019-02-20 (×5): qty 1

## 2019-02-20 MED ORDER — CINACALCET HCL 30 MG PO TABS
30.0000 mg | ORAL_TABLET | Freq: Every day | ORAL | Status: DC
  Administered 2019-02-20 – 2019-02-22 (×3): 30 mg via ORAL
  Filled 2019-02-20 (×3): qty 1

## 2019-02-20 MED ORDER — MIDODRINE HCL 5 MG PO TABS
10.0000 mg | ORAL_TABLET | ORAL | Status: DC
  Administered 2019-02-20 – 2019-02-24 (×3): 10 mg via ORAL
  Filled 2019-02-20 (×2): qty 2

## 2019-02-20 MED ORDER — METOPROLOL TARTRATE 12.5 MG HALF TABLET
12.5000 mg | ORAL_TABLET | Freq: Two times a day (BID) | ORAL | Status: DC
  Administered 2019-02-21: 12.5 mg via ORAL
  Filled 2019-02-20 (×3): qty 1

## 2019-02-20 MED ORDER — CHLORHEXIDINE GLUCONATE CLOTH 2 % EX PADS
6.0000 | MEDICATED_PAD | Freq: Every day | CUTANEOUS | Status: DC
  Administered 2019-02-22: 6 via TOPICAL

## 2019-02-20 MED ORDER — CLOPIDOGREL BISULFATE 75 MG PO TABS
75.0000 mg | ORAL_TABLET | Freq: Every day | ORAL | Status: DC
  Administered 2019-02-20 – 2019-02-24 (×5): 75 mg via ORAL
  Filled 2019-02-20 (×5): qty 1

## 2019-02-20 MED ORDER — LIDOCAINE-PRILOCAINE 2.5-2.5 % EX CREA: 1.0000 "application " | TOPICAL_CREAM | CUTANEOUS | Status: DC | PRN

## 2019-02-20 MED ORDER — ALBUMIN HUMAN 25 % IV SOLN
INTRAVENOUS | Status: AC
  Administered 2019-02-20: 25 g
  Filled 2019-02-20: qty 100

## 2019-02-20 MED ORDER — IOHEXOL 350 MG/ML SOLN
100.0000 mL | Freq: Once | INTRAVENOUS | Status: AC | PRN
  Administered 2019-02-20: 100 mL via INTRAVENOUS

## 2019-02-20 MED ORDER — ATORVASTATIN CALCIUM 80 MG PO TABS
80.0000 mg | ORAL_TABLET | Freq: Every day | ORAL | Status: DC
  Administered 2019-02-20 – 2019-02-23 (×4): 80 mg via ORAL
  Filled 2019-02-20: qty 1
  Filled 2019-02-20: qty 2
  Filled 2019-02-20 (×2): qty 1

## 2019-02-20 MED ORDER — HEPARIN SODIUM (PORCINE) 1000 UNIT/ML DIALYSIS: 1000.0000 [IU] | INTRAMUSCULAR | Status: DC | PRN

## 2019-02-20 MED ORDER — ALBUTEROL SULFATE (2.5 MG/3ML) 0.083% IN NEBU: 2.5000 mg | INHALATION_SOLUTION | RESPIRATORY_TRACT | Status: DC | PRN

## 2019-02-20 NOTE — Progress Notes (Signed)
Pharmacy Antibiotic Note  Jeffrey [name] is a 78 y.o. male admitted on 02/19/2019 with possible diabetic foot infection.  Pharmacy has been consulted for cefepime dosing. Patient with h/o ESRD on HD TTS.   Plan: Cefepime 1gm IV q24h Monitor for de-escalation, LOT and clinical course     Temp (24hrs), Avg:97.7 F (36.5 C), Min:97.6 F (36.4 C), Max:97.7 F (36.5 C)  Recent Labs  Lab 02/20/19 0026 02/20/19 0217 02/20/19 0650  WBC 9.3  --  9.4  CREATININE 3.27*  --  3.59*  LATICACIDVEN 2.5* 3.3* 2.8*    CrCl cannot be calculated (Unknown ideal weight.).    Allergies  Allergen Reactions  . Byetta 10 Mcg Pen [Exenatide] Diarrhea and Nausea And Vomiting  . Codeine Itching  . Coumadin [Warfarin Sodium] Rash   Zykera Abella A. Levada Dy, PharmD, [place] Please utilize Amion for appropriate phone number to reach the unit pharmacist ([place])    02/20/2019 9:08 AM

## 2019-02-20 NOTE — Consult Note (Signed)
[place] Nurse wound consult note This patient does not have an open wound, no consultation needed from the [facility] nurse.   Re consult if needed, will not follow at this time. Thanks  Hurschel Paynter [employer], RN,CWOCN, CNS, [place] [phone])

## 2019-02-20 NOTE — ED Notes (Signed)
2 person assist to use bedside toilet. Pt had large bowel movement, cleaned himself and assisted back into bed.

## 2019-02-20 NOTE — H&P (Signed)
History and Physical    Jeffrey [name]:[id number] DOB: 1941-02-23 DOA: 02/19/2019  PCP: Scot Jun, FNP   Patient coming from: Home.  Chief Complaint: Left lower extremity toe discoloration.  HPI: Jeffrey [name] is a 78 y.o. male with history of CAD status post stenting, ESRD on hemodialysis Tuesday Thursday Saturday, diabetes mellitus type 2, anemia, CHF, pacemaker placement, status post TAVR, A. fib on apixaban presents to the ER with complaints of increasing swelling of the left lower extremity over the last 4 days with discoloration of the middle 3 toes of the left foot over the last 2 days.  Patient has poor sensation below knee and has no pain as such.  Denies any trauma.  Denies fever chills.  Swelling is more on the left side than right.  ED Course: In the ER x-rays show soft tissue swelling.  No definite fractures.  CT left lower extremity arteriography does not show any definite obstruction.  Pulses are dopplerable.  Lactate was elevated which went up after 1 L fluid.  Given the discoloration of fourth and swelling patient admitted for further management and may need vascular input.  Review of Systems: As per HPI, rest all negative.   Past Medical History:  Diagnosis Date  . Anginal pain ([place])   . Aortic stenosis    a. severe by echo 09/2014  . Atrial fibrillation ([place])    a. not well documented, not on anticoagulation  . CHF (congestive heart failure) ([place])    04/28/17 echo-EF 40%, mod LVH, diastolic dysfunction  . Claustrophobia   . Complete heart block ([place])   . COPD (chronic obstructive pulmonary disease) ([place])   . Coronary artery disease    a. chronically occluded RCA per cath 09/2014 with collaterals B. cath 05/01/17 chr occ RCA w/collaterals, 60-70% mid LAD,   . CVA (cerebral vascular accident) ([place]) 10/2014   denies residual on 07/11/2015  . ESRD (end stage renal disease) on dialysis [facility])    a. on dialysis; [facility]; MWF, LUE fistula (07/11/2015)   . History of blood transfusion    "related to gallbladder OR"  . History of stomach ulcers   . Hyperlipidemia   . Hypertension   . Iron deficiency anemia   . Myocardial infarction ([place]) 10/2014  . Peripheral vascular disease ([place])   . Pneumonia   . Presence of permanent cardiac pacemaker   . 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 Type II diabetes mellitus ([place])     Past Surgical History:  Procedure Laterality Date  . AV FISTULA PLACEMENT Left 10/19/2014   Procedure: BRACHIOCEPHALIC ARTERIOVENOUS (AV) FISTULA CREATION ;  Surgeon: Conrad [place], MD;  Location: [place];  Service: Vascular;  Laterality: Left;  . CARDIAC CATHETERIZATION    . CARDIAC CATHETERIZATION N/A 07/22/2015   Procedure: Right/Left Heart Cath and Coronary Angiography;  Surgeon: [name], MD;  Location: [place] CV LAB;  Service: Cardiovascular;  Laterality: N/A;  . CATARACT EXTRACTION W/ INTRAOCULAR LENS  IMPLANT, BILATERAL Bilateral 1990's  . CHOLECYSTECTOMY OPEN  1980's  . COLONOSCOPY W/ BIOPSIES AND POLYPECTOMY    . CORONARY ANGIOGRAPHY N/A 07/31/2018   Procedure: CORONARY ANGIOGRAPHY;  Surgeon: Martinique, Peter M, MD;  Location: [place] CV LAB;  Service: Cardiovascular;  Laterality: N/A;  . CORONARY ANGIOPLASTY    . CORONARY ATHERECTOMY N/A 04/18/2018   Procedure: CORONARY ATHERECTOMY;  Surgeon: [name], MD;  Location: [place] CV LAB;  Service: Cardiovascular;  Laterality: N/A;  . CORONARY BALLOON ANGIOPLASTY N/A 07/31/2018   Procedure: CORONARY BALLOON ANGIOPLASTY;  Surgeon: Martinique, Peter M, MD;  Location: [place] CV LAB;  Service: Cardiovascular;  Laterality: N/A;  . CORONARY STENT INTERVENTION N/A 04/18/2018   Procedure: CORONARY STENT INTERVENTION;  Surgeon: [name], MD;  Location: [place] CV LAB;  Service: Cardiovascular;  Laterality: N/A;   . EP IMPLANTABLE DEVICE N/A 07/11/2015   Procedure: Pacemaker Implant;  Surgeon: [name], MD;  Location: [place] [facility];  Service: Cardiovascular;  Laterality: N/A;  . ESOPHAGOGASTRODUODENOSCOPY  08/01/2012   Procedure: ESOPHAGOGASTRODUODENOSCOPY (EGD);  Surgeon: [name], MD;  Location: [facility];  Service: Endoscopy;  Laterality: N/A;  . INSERT / REPLACE / REMOVE PACEMAKER  07/11/2015  . INSERTION OF DIALYSIS CATHETER Right 02/02/2015   Procedure: INSERTION OF DIALYSIS CATHETER  RIGHT INTERNAL JUGULAR;  Surgeon: Mal [name], MD;  Location: [place];  Service: Vascular;  Laterality: Right;  . LEFT AND RIGHT HEART CATHETERIZATION WITH CORONARY ANGIOGRAM N/A 09/30/2014   Procedure: LEFT AND RIGHT HEART CATHETERIZATION WITH CORONARY ANGIOGRAM;  Surgeon: [name], MD;  Location: [facility] CATH LAB;  Service: Cardiovascular;  Laterality: N/A;  . LEFT HEART CATH AND CORONARY ANGIOGRAPHY N/A 04/14/2018   Procedure: LEFT HEART CATH AND CORONARY ANGIOGRAPHY;  Surgeon: [name], MD;  Location: [place] CV LAB;  Service: Cardiovascular;  Laterality: N/A;  . TEE WITHOUT CARDIOVERSION N/A 08/02/2015   Procedure: TRANSESOPHAGEAL ECHOCARDIOGRAM (TEE);  Surgeon: [name], MD;  Location: [place];  Service: Open Heart Surgery;  Laterality: N/A;  . TONSILLECTOMY    . TRANSCATHETER AORTIC VALVE REPLACEMENT, TRANSFEMORAL Left 08/02/2015   Procedure: TRANSCATHETER AORTIC VALVE REPLACEMENT, TRANSFEMORAL;  Surgeon: [name], MD;  Location: [place];  Service: Open Heart Surgery;  Laterality: Left;     reports that he quit smoking about 35 years ago. He has a 64.00 pack-year smoking history. He has never used smokeless tobacco. He reports current alcohol use. He reports that he does not use drugs.  Allergies  Allergen Reactions  . Byetta 10 Mcg Pen [Exenatide] Diarrhea and Nausea And Vomiting  . Codeine Itching  . Coumadin [Warfarin Sodium] Rash    Family History  Problem Relation Age  of Onset  . Diabetes Father   . Heart disease Father   . Diabetes Sister   . Alzheimer's disease Mother   . Stroke Mother   . Diabetes Paternal Grandmother   . Alzheimer's disease Maternal Aunt        x 8 Maternal Aunts  . Cancer Maternal Uncle        type unknown    Prior to Admission medications   Medication Sig Start Date End Date Taking? Authorizing Provider  albuterol (PROVENTIL HFA;VENTOLIN HFA) 108 (90 Base) MCG/ACT inhaler Inhale 2 puffs into the lungs every 4 (four) hours as needed for wheezing or shortness of breath. 11/27/18   Tanda Rockers, MD  albuterol (PROVENTIL) (2.5 MG/3ML) 0.083% nebulizer solution Take 3 mLs (2.5 mg total) by nebulization every 6 (six) hours as needed for wheezing or shortness of breath. 01/07/19   Scot Jun, FNP  apixaban (ELIQUIS) 5 MG TABS tablet Take 1 tablet (5 mg total) by mouth 2 (two) times daily with a meal. 09/02/18   Deboraha Sprang, MD  atorvastatin (LIPITOR) 80 MG tablet Take 1 tablet (80 mg total) by mouth  daily at 6 PM. 01/07/19   Scot Jun, FNP  budesonide-formoterol [facility]) 160-4.5 MCG/ACT inhaler Inhale 2 puffs into the lungs 2 (two) times daily. 11/27/18   Tanda Rockers, MD  cinacalcet (SENSIPAR) 30 MG tablet Take 30 mg by mouth daily with supper.    [provider]  clopidogrel (PLAVIX) 75 MG tablet Take 1 tablet (75 mg total) by mouth daily. 09/02/18   Martinique, Peter M, MD  erythromycin ophthalmic ointment Place 1 application into the right eye at bedtime. 01/13/19   Scot Jun, FNP  guaiFENesin (MUCINEX) 600 MG 12 hr tablet Take 1 tablet (600 mg total) by mouth 2 (two) times daily. 01/07/19   Scot Jun, FNP  hydrOXYzine (ATARAX/VISTARIL) 25 MG tablet Take 0.5-1 tablets (12.5-25 mg total) by mouth 2 (two) times daily as needed for itching. 01/07/19   Scot Jun, FNP  insulin detemir (LEVEMIR) 100 UNIT/ML injection Inject 0.48 mLs (48 Units total) into the skin at bedtime. 01/07/19    Scot Jun, FNP  Insulin Pen Needle 31G X 6 MM MISC USE 1  THREE TIMES DAILY 08/28/18   [provider]  lidocaine-prilocaine (EMLA) cream Apply 1 application topically once as needed (prior to accessing port).     [provider]  loratadine (CLARITIN) 10 MG tablet Take 10 mg by mouth daily.    [provider]  metoprolol tartrate (LOPRESSOR) 25 MG tablet One half twice daily Patient taking differently: Take 12.5 mg by mouth 2 (two) times daily.  11/27/18   Tanda Rockers, MD  nitroGLYCERIN (NITROSTAT) 0.4 MG SL tablet Place 1 tablet (0.4 mg total) under the tongue every 5 (five) minutes as needed for chest pain. 04/19/18   Rai, Ripudeep K, MD  olopatadine (PATANOL) 0.1 % ophthalmic solution Place 1 drop into the right eye 2 (two) times daily. 01/13/19   Scot Jun, FNP  OXYGEN Inhale into the lungs. 2-3 lpm 24/7    [provider]  pantoprazole (PROTONIX) 40 MG tablet Take 1 tablet (40 mg total) by mouth daily. 08/29/18   Deboraha Sprang, MD  sevelamer carbonate (RENVELA) 800 MG tablet Take 1,600-4,000 mg by mouth See admin instructions. Take 5 tablets by mouth 3 times daily with meals and then 2 tablets twice daily with snacks     [provider]  triamcinolone cream (KENALOG) 0.1 % Apply 1 application topically 2 (two) times daily. 01/07/19   Scot Jun, FNP    Physical Exam: Vitals:   02/20/19 0446 02/20/19 0500 02/20/19 0516 02/20/19 0600  BP: (!) 137/97   98/62  Pulse: (!) 54 61 70 (!) 55  Resp:    16  Temp:      TempSrc:      SpO2: 100% 99% 98% 98%      Constitutional: Moderately built and nourished. Vitals:   02/20/19 0446 02/20/19 0500 02/20/19 0516 02/20/19 0600  BP: (!) 137/97   98/62  Pulse: (!) 54 61 70 (!) 55  Resp:    16  Temp:      TempSrc:      SpO2: 100% 99% 98% 98%   Eyes: Anicteric no pallor. ENMT: No discharge from the ears eyes nose and mouth. Neck: No mass felt.  No neck rigidity.  Respiratory: No rhonchi or crepitations. Cardiovascular: S1-S2 heard. Abdomen: Soft nontender bowel sounds present. Musculoskeletal: Swelling and discoloration of the left lower extremity with discoloration of the middle 3 toes with pulses felt with Doppler.  Skin: Discoloration of the left lower extremity erythematous.  Middle 3 toes of the left foot is discolored with mild bluish hue. Neurologic: Alert awake oriented to time place and person.  Moves all extremities. Psychiatric: Appears normal per normal affect.   Labs on Admission: I have personally reviewed following labs and imaging studies  CBC: Recent Labs  Lab 02/20/19 0026  WBC 9.3  NEUTROABS 6.4  HGB 10.2*  HCT 34.6*  MCV 96.1  PLT 614   Basic Metabolic Panel: Recent Labs  Lab 02/20/19 0026  NA 135  K 4.3  CL 94*  CO2 26  GLUCOSE 115*  BUN 16  CREATININE 3.27*  CALCIUM 8.6*   GFR: CrCl cannot be calculated (Unknown ideal weight.). Liver Function Tests: No results for input(s): AST, ALT, ALKPHOS, BILITOT, PROT, ALBUMIN in the last 168 hours. No results for input(s): LIPASE, AMYLASE in the last 168 hours. No results for input(s): AMMONIA in the last 168 hours. Coagulation Profile: No results for input(s): INR, PROTIME in the last 168 hours. Cardiac Enzymes: No results for input(s): CKTOTAL, CKMB, CKMBINDEX, TROPONINI in the last 168 hours. BNP (last 3 results) No results for input(s): PROBNP in the last 8760 hours. HbA1C: No results for input(s): HGBA1C in the last 72 hours. CBG: No results for input(s): GLUCAP in the last 168 hours. Lipid Profile: No results for input(s): CHOL, HDL, LDLCALC, TRIG, CHOLHDL, LDLDIRECT in the last 72 hours. Thyroid Function Tests: No results for input(s): TSH, T4TOTAL, FREET4, T3FREE, THYROIDAB in the last 72 hours. Anemia Panel: No results for input(s): VITAMINB12, FOLATE, FERRITIN, TIBC, IRON, RETICCTPCT in the last 72 hours. Urine analysis:    Component Value  Date/Time   COLORURINE YELLOW 06/17/2017 [place] 06/17/2017 0547   LABSPEC 1.010 06/17/2017 0547   PHURINE 6.0 06/17/2017 0547   GLUCOSEU >=500 (A) 06/17/2017 0547   HGBUR SMALL (A) 06/17/2017 0547   BILIRUBINUR NEGATIVE 06/17/2017 0547   KETONESUR NEGATIVE 06/17/2017 0547   PROTEINUR 100 (A) 06/17/2017 0547   UROBILINOGEN 0.2 09/28/2014 2257   NITRITE NEGATIVE 06/17/2017 0547   LEUKOCYTESUR TRACE (A) 06/17/2017 0547   Sepsis Labs: @LABRCNTIP (procalcitonin:4,lacticidven:4) )No results found for this or any previous visit (from the past 240 hour(s)).   Radiological Exams on Admission: Ct Angio Low Extrem Left W &/or Wo Contrast  Result Date: 02/20/2019 CLINICAL DATA:  Cold left foot, sudden onset, vascular compromise suspected. There is also history of diabetes with left-sided foot discoloration becoming worse over 2 months. EXAM: CT ANGIOGRAPHY LOWER LEFT EXTREMITY TECHNIQUE: Arterially timed CTA of the left lower extremity was performed after bolus administration of intravenous contrast. CONTRAST:  173mL OMNIPAQUE IOHEXOL 350 MG/ML SOLN COMPARISON:  None similar FINDINGS: Iliacs: Atherosclerotic calcification without dissection or flow limiting stenosis. Common femoral: Atherosclerosis without flow limiting stenosis or dissection. Superficial femoral artery: Heavily calcified main trunk and multiple orders of downstream branches which are not well evaluated due to the degree of calcified plaque blooming relative to the lumen size. There is up to 70% atheromatous stenosis in the distal SFA due to bulky calcified plaque. No acute occlusion is seen. Popliteal: Heavily calcified wall with diffuse mild to moderate narrowing. Leg: The tibioperoneal trunk and proximal peroneal artery is patent. Below the upper calf vessels are heavily calcified such that luminal density is not confidently measurable. There is no definite flow within branches of the foot. Nonvascular: Fatty left  inguinal hernia. Enlarged prostate with bladder wall thickening. Small volume pelvic ascites. Diffuse subcutaneous edema  in the left lower extremity without soft tissue gas or abscess. No bony erosion or joint destruction. Advanced knee osteoarthritis. Review of the MIP images confirms the above findings. IMPRESSION: Heavily and diffusely calcified arteries which precludes luminal visualization beyond the upper calf. No acute occlusion is seen. There is extensive moderate to advanced atheromatous narrowing of the SFA and popliteal arteries. Electronically Signed   By: Monte Fantasia M.D.   On: 02/20/2019 04:29   Dg Chest Port 1 View  Result Date: 02/20/2019 CLINICAL DATA:  Initial evaluation for elevated lactate. EXAM: PORTABLE CHEST 1 VIEW COMPARISON:  Prior radiograph from 11/29/2018 FINDINGS: Right-sided pacemaker/AICD in place. Advanced cardiomegaly, stable. Mediastinal silhouette normal. Aortic atherosclerosis. Lungs mildly hypoinflated. Diffuse pulmonary vascular and interstitial congestion, consistent with mild diffuse pulmonary interstitial edema. No consolidative space opacity. No pneumothorax. Probable trace right pleural effusion. Osseous structures within normal limits. IMPRESSION: 1. Cardiomegaly with mild diffuse pulmonary interstitial edema and small right pleural effusion. 2. Aortic atherosclerosis. Electronically Signed   By: Jeannine Boga M.D.   On: 02/20/2019 03:54   Dg Foot Complete Left  Result Date: 02/20/2019 CLINICAL DATA:  Bruising and discoloration for 2 days. EXAM: LEFT FOOT - COMPLETE 3+ VIEW COMPARISON:  Left foot radiograph 01/13/2009 FINDINGS: Hammertoe deformity of the digits which limits assessment. No fracture, dislocation, or bony destructive change. Midfoot osteoarthritis. Probable remote fracture of the third proximal metatarsal, unchanged in appearance from prior exam. Small plantar calcaneal spur. There are dense vascular calcifications. Soft tissue edema noted  about the dorsum of the foot. No soft tissue air. IMPRESSION: 1. Dorsal soft tissue edema.  No acute osseous abnormality. 2. Midfoot osteoarthritis. Hammertoe deformity of the digits limits assessment. Electronically Signed   By: Keith Rake M.D.   On: 02/20/2019 00:25     Assessment/Plan Principal Problem:   Discoloration of skin of foot Active Problems:   Type 2 diabetes mellitus with chronic kidney disease on chronic dialysis, with long-term current use of insulin (HCC)   Essential hypertension   ESRD (end stage renal disease) on dialysis (HCC)   Chronic combined systolic and diastolic CHF (congestive heart failure) (HCC)   S/P TAVR (transcatheter aortic valve replacement)   Atrial fibrillation (HCC) [I48.91]   Discoloration of skin of multiple sites of lower extremity    1. Left foot middle 3 toes discolored with the swelling of the left leg and erythema -we will check Dopplers of the lower extremity also ABI.  Consult vascular surgery in the morning for further input.  CT arteriography of the left p does not show any obstruction.  Pulses are dopplerable.  Patient is on anticoagulation for A. Fib. 2. A. fib rate controlled at this time.  Has pacemaker.  On anticoagulation since patient may need procedure will keep patient on heparin and hold apixaban.  Rate controlled. 3. CAD status post ending denies any chest pain.  On anticoagulation statin.  And Plavix. 4. ESRD on hemodialysis on Tuesday Thursday and Saturday.  On that patient has received 1 L fluid in the ER for lactic acidosis.  Closely monitor respiratory status may need dialysis earlier.  Presently not hypoxic. 5. Anemia likely from ESRD.  Follow CBC. 6. Lactic acidosis does not look septic.  Will repeat lactate levels.  Closely observe. 7. Status post TAVR. 8. History of CHF volume managed by nephrology.   DVT prophylaxis: Heparin.  Patient takes apixaban. Code Status: Full code. Family Communication: No family at the  bedside. Disposition Plan: Home. Consults called: None.  Admission status: Observation.   Rise Patience MD [facility] Pager [phone].  If 7PM-7AM, please contact night-coverage [email or web address] Password [facility]  02/20/2019, 6:46 AM

## 2019-02-20 NOTE — Progress Notes (Signed)
CRITICAL VALUE ALERT  Critical Value: Lactic Acid: 2.8  Date & Time Notied: 02/20/19 0800  Provider Notified: Sloan Leiter, MD   Orders Received/Actions taken: Sloan Leiter, MD at bedside. MD stated that although B/P is low, patient is asymptomatic and MD feels that patient does not need fluids at this time. Antibiotics are to be ordered by MD. Will continue to monitor.

## 2019-02-20 NOTE — Progress Notes (Signed)
New Admission Note:   Arrival Method: Stretcher Mental Orientation: A&OX4 Telemetry: Initiated Assessment: Completed Skin: See Flowsheets IV: WDL Pain: Denies Safety Measures: Safety Fall Prevention Plan has been given, discussed and signed Admission: To Be Completed Unit Orientation: Patient has been orientated to the room, unit and staff.   Orders have been reviewed and implemented. Will continue to monitor the patient. Call light has been placed within reach and bed alarm has been activated.    Aneta Mins BSN, RN

## 2019-02-20 NOTE — ED Notes (Signed)
ED TO INPATIENT HANDOFF REPORT  ED Nurse Name and Phone #:   Freida Busman  [phone]  S Name/Age/Gender Jeffrey [name] 78 y.o. male Room/Bed: 032C/032C  Code Status   Code Status: Full Code  Home/SNF/Other Home Patient oriented to: self, place, time and situation Is this baseline? Yes   Triage Complete: Triage complete  Chief Complaint Feet swelling,infection   Triage Note Pt sent by doctor, c/o left toe/foot discoloration x 2 days. Unsure of injury, hx diabetes and neuropathy.    Allergies Allergies  Allergen Reactions  . Byetta 10 Mcg Pen [Exenatide] Diarrhea and Nausea And Vomiting  . Codeine Itching  . Coumadin [Warfarin Sodium] Rash    Level of Care/Admitting Diagnosis ED Disposition    ED Disposition Condition [facility]: [place] [100100]  Level of Care: Telemetry Medical [104]  I expect the patient will be discharged within 24 hours: No (not a candidate for 5C-Observation unit)  Diagnosis: Discoloration of skin of multiple sites of lower extremity [664403]  Admitting Physician: Rise Patience [phone]  Attending Physician: Rise Patience [[id number]]  PT Class (Do Not Modify): Observation [104]  PT Acc Code (Do Not Modify): Observation [10022]       B Medical/Surgery History Past Medical History:  Diagnosis Date  . Anginal pain ([place])   . Aortic stenosis    a. severe by echo 09/2014  . Atrial fibrillation ([place])    a. not well documented, not on anticoagulation  . CHF (congestive heart failure) ([place])    04/28/17 echo-EF 40%, mod LVH, diastolic dysfunction  . Claustrophobia   . Complete heart block ([place])   . COPD (chronic obstructive pulmonary disease) ([place])   . Coronary artery disease    a. chronically occluded RCA per cath 09/2014 with collaterals B. cath 05/01/17 chr occ RCA w/collaterals, 60-70% mid LAD,   . CVA (cerebral vascular accident) ([place]) 10/2014   denies residual on 07/11/2015  . ESRD (end stage renal  disease) on dialysis [facility])    a. on dialysis; [facility]; MWF, LUE fistula (07/11/2015)  . History of blood transfusion    "related to gallbladder OR"  . History of stomach ulcers   . Hyperlipidemia   . Hypertension   . Iron deficiency anemia   . Myocardial infarction ([place]) 10/2014  . Peripheral vascular disease ([place])   . Pneumonia   . Presence of permanent cardiac pacemaker   . 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 Type II diabetes mellitus ([place])    Past Surgical History:  Procedure Laterality Date  . AV FISTULA PLACEMENT Left 10/19/2014   Procedure: BRACHIOCEPHALIC ARTERIOVENOUS (AV) FISTULA CREATION ;  Surgeon: Conrad South Whitley, MD;  Location: [place];  Service: Vascular;  Laterality: Left;  . CARDIAC CATHETERIZATION    . CARDIAC CATHETERIZATION N/A 07/22/2015   Procedure: Right/Left Heart Cath and Coronary Angiography;  Surgeon: [name], MD;  Location: [place] CV [facility];  Service: Cardiovascular;  Laterality: N/A;  . CATARACT EXTRACTION W/ INTRAOCULAR LENS  IMPLANT, BILATERAL Bilateral 1990's  . CHOLECYSTECTOMY OPEN  1980's  . COLONOSCOPY W/ BIOPSIES AND POLYPECTOMY    . CORONARY ANGIOGRAPHY N/A 07/31/2018   Procedure: CORONARY ANGIOGRAPHY;  Surgeon: Martinique, Peter M, MD;  Location: [place] [facility];  Service: Cardiovascular;  Laterality: N/A;  . CORONARY ANGIOPLASTY    . CORONARY ATHERECTOMY N/A  04/18/2018   Procedure: CORONARY ATHERECTOMY;  Surgeon: [name], MD;  Location: [place] [facility];  Service: Cardiovascular;  Laterality: N/A;  . CORONARY BALLOON ANGIOPLASTY N/A 07/31/2018   Procedure: CORONARY BALLOON ANGIOPLASTY;  Surgeon: Martinique, Peter M, MD;  Location: [place] [facility];  Service: Cardiovascular;  Laterality: N/A;  . CORONARY STENT INTERVENTION N/A 04/18/2018   Procedure: CORONARY STENT INTERVENTION;  Surgeon: [name], MD;  Location: [place] [facility];  Service: Cardiovascular;  Laterality: N/A;  . EP IMPLANTABLE DEVICE N/A 07/11/2015   Procedure: Pacemaker Implant;  Surgeon: [name], MD;  Location: [place] [facility];  Service: Cardiovascular;  Laterality: N/A;  . ESOPHAGOGASTRODUODENOSCOPY  08/01/2012   Procedure: ESOPHAGOGASTRODUODENOSCOPY (EGD);  Surgeon: [name], MD;  Location: [facility];  Service: Endoscopy;  Laterality: N/A;  . INSERT / REPLACE / REMOVE PACEMAKER  07/11/2015  . INSERTION OF DIALYSIS CATHETER Right 02/02/2015   Procedure: INSERTION OF DIALYSIS CATHETER  RIGHT INTERNAL JUGULAR;  Surgeon: Mal [name], MD;  Location: [place];  Service: Vascular;  Laterality: Right;  . LEFT AND RIGHT HEART CATHETERIZATION WITH CORONARY ANGIOGRAM N/A 09/30/2014   Procedure: LEFT AND RIGHT HEART CATHETERIZATION WITH CORONARY ANGIOGRAM;  Surgeon: [name], MD;  Location: [facility] C[facility];  Service: Cardiovascular;  Laterality: N/A;  . LEFT HEART CATH AND CORONARY ANGIOGRAPHY N/A 04/14/2018   Procedure: LEFT HEART CATH AND CORONARY ANGIOGRAPHY;  Surgeon: [name], MD;  Location: [place] [facility];  Service: Cardiovascular;  Laterality: N/A;  . TEE WITHOUT CARDIOVERSION N/A 08/02/2015   Procedure: TRANSESOPHAGEAL ECHOCARDIOGRAM (TEE);  Surgeon: [name], MD;  Location: [place];  Service: Open Heart Surgery;  Laterality: N/A;  . TONSILLECTOMY    . TRANSCATHETER AORTIC VALVE REPLACEMENT, TRANSFEMORAL Left 08/02/2015   Procedure: TRANSCATHETER AORTIC VALVE REPLACEMENT, TRANSFEMORAL;  Surgeon: [name], MD;  Location: [place];  Service: Open Heart Surgery;  Laterality: Left;     A IV Location/Drains/Wounds Patient Lines/Drains/Airways Status   Active Line/Drains/Airways    Name:   Placement date:   Placement time:   Site:   Days:   Peripheral IV 02/20/19 Right;Upper Forearm   02/20/19    0149    Forearm   less than 1   Fistula / Graft Left Upper arm Arteriovenous fistula    -    -    Upper arm             Intake/Output Last 24 hours  Intake/Output Summary (Last 24 hours) at 02/20/2019 0651 Last data filed at 02/20/2019 0435 Gross per 24 hour  Intake 1000 ml  Output -  Net 1000 ml    Labs/Imaging Results for orders placed or performed during the hospital encounter of 02/19/19 (from the past 48 hour(s))  Basic metabolic panel     Status: Abnormal   Collection Time: 02/20/19 12:26 AM  Result Value Ref Range   Sodium 135 135 - 145 mmol/L   Potassium 4.3 3.5 - 5.1 mmol/L   Chloride 94 (L) 98 - 111 mmol/L   CO2 26 22 - 32 mmol/L   Glucose, Bld 115 (H) 70 - 99 mg/dL   BUN 16 8 - 23 mg/dL   Creatinine, Ser 3.27 (H) 0.61 - 1.24 mg/dL   Calcium 8.6 (L) 8.9 - 10.3 mg/dL   GFR calc non Af Amer 17 (L) >60 mL/min   GFR calc Af Amer 20 (L) >60 mL/min   Anion gap 15 5 - 15  Comment: Performed at [facility], [place] [address] [place], [place] [address]  CBC with Differential     Status: Abnormal   Collection Time: 02/20/19 12:26 AM  Result Value Ref Range   WBC 9.3 4.0 - 10.5 K/uL   RBC 3.60 (L) 4.22 - 5.81 MIL/uL   Hemoglobin 10.2 (L) 13.0 - 17.0 g/dL   HCT 34.6 (L) 39.0 - 52.0 %   MCV 96.1 80.0 - 100.0 fL   MCH 28.3 26.0 - 34.0 pg   MCHC 29.5 (L) 30.0 - 36.0 g/dL   RDW 17.0 (H) 11.5 - 15.5 %   Platelets 197 150 - 400 K/uL   nRBC 0.0 0.0 - 0.2 %   Neutrophils Relative % 68 %   Neutro Abs 6.4 1.7 - 7.7 K/uL   Lymphocytes Relative 12 %   Lymphs Abs 1.1 0.7 - 4.0 K/uL   Monocytes Relative 15 %   Monocytes Absolute 1.4 (H) 0.1 - 1.0 K/uL   Eosinophils Relative 4 %   Eosinophils Absolute 0.4 0.0 - 0.5 K/uL   Basophils Relative 1 %   Basophils Absolute 0.1 0.0 - 0.1 K/uL   Immature Granulocytes 0 %   Abs Immature Granulocytes 0.03 0.00 - 0.07 K/uL    Comment: Performed at [facility], [place] [address] [place], [place] [address]  Lactic acid, plasma     Status: Abnormal   Collection Time: 02/20/19 12:26 AM  Result Value Ref Range    Lactic Acid, Venous 2.5 (HH) 0.5 - 1.9 mmol/L    Comment: CRITICAL RESULT CALLED TO, READ BACK BY AND VERIFIED WITH: Jabria Loos M,RN 02/20/19 0101 WAYK Performed at [facility], [place] [address] [place], [place] [address]   Lactic acid, plasma     Status: Abnormal   Collection Time: 02/20/19  2:17 AM  Result Value Ref Range   Lactic Acid, Venous 3.3 (HH) 0.5 - 1.9 mmol/L    Comment: CRITICAL RESULT CALLED TO, READ BACK BY AND VERIFIED WITH: Amiya Escamilla M,RN 02/20/19 7903 [facility] Performed at [facility], [place] [address], [place], [place] [address]    Ct Angio Low Extrem Left W &/or Wo Contrast  Result Date: 02/20/2019 CLINICAL DATA:  Cold left foot, sudden onset, vascular compromise suspected. There is also history of diabetes with left-sided foot discoloration becoming worse over 2 months. EXAM: CT ANGIOGRAPHY LOWER LEFT EXTREMITY TECHNIQUE: Arterially timed CTA of the left lower extremity was performed after bolus administration of intravenous contrast. CONTRAST:  115mL OMNIPAQUE IOHEXOL 350 MG/ML SOLN COMPARISON:  None similar FINDINGS: [place]: Atherosclerotic calcification without dissection or flow limiting stenosis. Common femoral: Atherosclerosis without flow limiting stenosis or dissection. Superficial femoral artery: Heavily calcified main trunk and multiple orders of downstream branches which are not well evaluated due to the degree of calcified plaque blooming relative to the lumen size. There is up to 70% atheromatous stenosis in the distal SFA due to bulky calcified plaque. No acute occlusion is seen. Popliteal: Heavily calcified wall with diffuse mild to moderate narrowing. Leg: The tibioperoneal trunk and proximal peroneal artery is patent. Below the upper calf vessels are heavily calcified such that luminal density is not confidently measurable. There is no definite flow within branches of the foot. Nonvascular: Fatty left inguinal hernia. Enlarged prostate with bladder wall  thickening. Small volume pelvic ascites. Diffuse subcutaneous edema in the left lower extremity without soft tissue gas or abscess. No bony erosion or joint destruction. Advanced knee osteoarthritis. Review of the  MIP images confirms the above findings. IMPRESSION: Heavily and diffusely calcified arteries which precludes luminal visualization beyond the upper calf. No acute occlusion is seen. There is extensive moderate to advanced atheromatous narrowing of the SFA and popliteal arteries. Electronically Signed   By: Monte Fantasia M.D.   On: 02/20/2019 04:29   Dg Chest Port 1 View  Result Date: 02/20/2019 CLINICAL DATA:  Initial evaluation for elevated lactate. EXAM: PORTABLE CHEST 1 VIEW COMPARISON:  Prior radiograph from 11/29/2018 FINDINGS: Right-sided pacemaker/AICD in place. Advanced cardiomegaly, stable. Mediastinal silhouette normal. Aortic atherosclerosis. Lungs mildly hypoinflated. Diffuse pulmonary vascular and interstitial congestion, consistent with mild diffuse pulmonary interstitial edema. No consolidative space opacity. No pneumothorax. Probable trace right pleural effusion. Osseous structures within normal limits. IMPRESSION: 1. Cardiomegaly with mild diffuse pulmonary interstitial edema and small right pleural effusion. 2. Aortic atherosclerosis. Electronically Signed   By: Jeannine Boga M.D.   On: 02/20/2019 03:54   Dg Foot Complete Left  Result Date: 02/20/2019 CLINICAL DATA:  Bruising and discoloration for 2 days. EXAM: LEFT FOOT - COMPLETE 3+ VIEW COMPARISON:  Left foot radiograph 01/13/2009 FINDINGS: Hammertoe deformity of the digits which limits assessment. No fracture, dislocation, or bony destructive change. Midfoot osteoarthritis. Probable remote fracture of the third proximal metatarsal, unchanged in appearance from prior exam. Small plantar calcaneal spur. There are dense vascular calcifications. Soft tissue edema noted about the dorsum of the foot. No soft tissue air.  IMPRESSION: 1. Dorsal soft tissue edema.  No acute osseous abnormality. 2. Midfoot osteoarthritis. Hammertoe deformity of the digits limits assessment. Electronically Signed   By: Keith Rake M.D.   On: 02/20/2019 00:25    Pending Labs Unresulted Labs (From admission, onward)    Start     Ordered   02/20/19 0646  Lactic acid, plasma  STAT Now then every 3 hours,   R     02/20/19 0646   02/20/19 0645  CBC WITH DIFFERENTIAL  Once,   R     02/20/19 0646   02/20/19 0645  Comprehensive metabolic panel  Once,   R     02/20/19 0646          Vitals/Pain Today's Vitals   02/20/19 0448 02/20/19 0500 02/20/19 0516 02/20/19 0600  BP:    98/62  Pulse:  61 70 (!) 55  Resp:    16  Temp:      TempSrc:      SpO2:  99% 98% 98%  PainSc: 0-No pain       Isolation Precautions No active isolations  Medications Medications  atorvastatin (LIPITOR) tablet 80 mg (has no administration in time range)  metoprolol tartrate (LOPRESSOR) tablet 12.5 mg (has no administration in time range)  nitroGLYCERIN (NITROSTAT) SL tablet 0.4 mg (has no administration in time range)  hydrOXYzine (ATARAX/VISTARIL) tablet 12.5-25 mg (has no administration in time range)  cinacalcet (SENSIPAR) tablet 30 mg (has no administration in time range)  insulin detemir (LEVEMIR) injection 48 Units (has no administration in time range)  pantoprazole (PROTONIX) EC tablet 40 mg (has no administration in time range)  sevelamer carbonate (RENVELA) tablet 1,600-4,000 mg (has no administration in time range)  clopidogrel (PLAVIX) tablet 75 mg (has no administration in time range)  albuterol (PROVENTIL HFA;VENTOLIN HFA) 108 (90 Base) MCG/ACT inhaler 2 puff (has no administration in time range)  albuterol (PROVENTIL) (2.5 MG/3ML) 0.083% nebulizer solution 2.5 mg (has no administration in time range)  mometasone-formoterol (DULERA) 200-5 MCG/ACT inhaler 2 puff (has no administration in time  range)  guaiFENesin (MUCINEX) 12 hr  tablet 600 mg (has no administration in time range)  loratadine (CLARITIN) tablet 10 mg (has no administration in time range)  olopatadine (PATANOL) 0.1 % ophthalmic solution 1 drop (has no administration in time range)  acetaminophen (TYLENOL) tablet 650 mg (has no administration in time range)    Or  acetaminophen (TYLENOL) suppository 650 mg (has no administration in time range)  ondansetron (ZOFRAN) tablet 4 mg (has no administration in time range)    Or  ondansetron (ZOFRAN) injection 4 mg (has no administration in time range)  insulin aspart (novoLOG) injection 0-9 Units (has no administration in time range)  sodium chloride 0.9 % bolus 1,000 mL (0 mLs Intravenous Stopped 02/20/19 0435)  iohexol (OMNIPAQUE) 350 MG/ML injection 100 mL (100 mLs Intravenous Contrast Given 02/20/19 0330)    Mobility non-ambulatory Low fall risk   Focused Assessments Cardiac Assessment Handoff:    Lab Results  Component Value Date   CKTOTAL 100 03/22/2017   TROPONINI 1.15 ([place]) 11/30/2018   No results found for: DDIMER Does the Patient currently have chest pain? No     R Recommendations: See Admitting Provider Note  Report given to:   Additional Notes:   Pt with existing hx of weakness, loss of sensation to BLEs d/t diabetic neuropathy, PVD. Bruising to L foot. Pedal pulses present in both feet with doppler. Dependent edema present, but baseline. Cannot ambulate by self. Two-person assist to bedside commode and from wheelchair. Sleeps in chair at home and strongly dislikes lying in bed, even in high [place]'s. Baseline hypotension with known hx of SBP 80-100 his normal. Gets dialysis on Tues-Thurs-Sat schedule.

## 2019-02-20 NOTE — ED Notes (Signed)
Admitting Provider at bedside. 

## 2019-02-20 NOTE — Procedures (Signed)
I was present at this dialysis session. I have reviewed the session itself and made appropriate changes.   Vital signs in last 24 hours:  Temp:  [97.6 F (36.4 C)-97.7 F (36.5 C)] 97.6 F (36.4 C) (03/27 0809) Pulse Rate:  [53-70] 54 (03/27 0809) Resp:  [14-20] 20 (03/27 0809) BP: (74-137)/(46-97) 74/54 (03/27 0809) SpO2:  [97 %-100 %] 100 % (03/27 0809) Weight change:  There were no vitals filed for this visit.  Recent Labs  Lab 02/20/19 0650  NA 135  K 4.7  CL 94*  CO2 24  GLUCOSE 167*  BUN 20  CREATININE 3.59*  CALCIUM 8.5*    Recent Labs  Lab 02/20/19 0026 02/20/19 0650  WBC 9.3 9.4  NEUTROABS 6.4 6.7  HGB 10.2* 10.2*  HCT 34.6* 33.7*  MCV 96.1 98.3  PLT 197 201    Scheduled Meds: . midodrine      . apixaban  2.5 mg Oral BID  . atorvastatin  80 mg Oral q1800  . Chlorhexidine Gluconate Cloth  6 each Topical Q0600  . cinacalcet  30 mg Oral Q supper  . clopidogrel  75 mg Oral Daily  . [START ON 02/21/2019] doxercalciferol  1 mcg Intravenous Q T,Th,Sa-HD  . guaiFENesin  600 mg Oral BID  . insulin aspart  0-9 Units Subcutaneous TID WC  . insulin detemir  48 Units Subcutaneous QHS  . loratadine  10 mg Oral Daily  . metoprolol tartrate  12.5 mg Oral BID  . [START ON 02/21/2019] midodrine  10 mg Oral Q T,Th,Sa-HD  . midodrine  10 mg Oral Once in dialysis  . mometasone-formoterol  2 puff Inhalation BID  . olopatadine  1 drop Right Eye BID  . pantoprazole  40 mg Oral Daily  . sevelamer carbonate  4,000 mg Oral TID WC   Continuous Infusions: . albumin human    . sodium chloride    . sodium chloride    . ceFEPime (MAXIPIME) IV    . metronidazole 500 mg (02/20/19 1024)   PRN Meds:.sodium chloride, sodium chloride, acetaminophen **OR** acetaminophen, albuterol, alteplase, heparin, hydrOXYzine, lidocaine (PF), lidocaine-prilocaine, nitroGLYCERIN, ondansetron **OR** ondansetron (ZOFRAN) IV, pentafluoroprop-tetrafluoroeth, sevelamer carbonate   Donetta Potts,  MD 02/20/2019, 1:28 PM

## 2019-02-20 NOTE — ED Notes (Signed)
[phone] pts daughter [name] would like a call back for an update of pt

## 2019-02-20 NOTE — ED Notes (Addendum)
Pt insisting on sitting on side of the bed, despite fall risk. States that he's claustrophobic and feels like he's trapped in a cage. Attempt to explain his risk of falling d/t weakness (2-person assist to bedside commode), loss of sensation in LEs, and daughter's report that he fell out of his chair at home that he usually sleeps in. Pt continues to insist on being able to sit on side or at foot of bed.

## 2019-02-20 NOTE — ED Notes (Signed)
Patient transported to CT 

## 2019-02-20 NOTE — Consult Note (Addendum)
[facility] Renal Consultation Note    Indication for Consultation:  Management of ESRD/hemodialysis; anemia, hypertension/volume and secondary hyperparathyroidism PCP:  HPI: Jeffrey [name] is a 78 y.o. 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 PMHx is signifcant for DM, CAD/hx TAVR, prior NSTEMIs, COPD, afib on apixaban PM  He presented to the ED yesterday with a history of loose stools x 1 week, LE swelling x 3 months with worsening LE erythema this past week.  He always sleeps in a recliner because if he is in a bed, it feels like he is smothering.  He wears O2 24/7 and reports he takes 1-2 NTG daily.  He had a rough time on dialysis yesterday and unable to get his fluid off.  He hasn't been eating much lately.  He was evaluated at dialysis yesterday and noted to bluish left lower toes with increasing rubor of LE as well as edema.  He has been unable to get volume off due to low BP. NEt UF yesterday was 2.4 and he was 1.4 above EDW.  Xray in ED showed soft tissue swelling. CT angio  LLE no obstruction. BP was low upon arrival and he was given a liter of fluid BP back down most recently but patient seen in the room after getting of BSC and not symptomatic. Labs this am K 4.7 Na 135 glu 167 alb 2.9 hgb 10.2 WBC 9.4 plts 201 lactic acid 2.8 CXR done upon admission showed mild diffuse pulmonary edema  Past Medical History:  Diagnosis Date  . Anginal pain ([place])   . Aortic stenosis    a. severe by echo 09/2014  . Atrial fibrillation ([place])    a. not well documented, not on anticoagulation  . CHF (congestive heart failure) ([place])    04/28/17 echo-EF 40%, mod LVH, diastolic dysfunction  . Claustrophobia   . Complete heart block ([place])   . COPD (chronic obstructive pulmonary disease) ([place])   . Coronary artery disease    a. chronically occluded RCA per cath 09/2014 with collaterals B. cath 05/01/17 chr occ RCA w/collaterals, 60-70% mid LAD,   . CVA (cerebral vascular accident) ([place])  10/2014   denies residual on 07/11/2015  . ESRD (end stage renal disease) on dialysis [facility])    a. on dialysis; [facility]; MWF, LUE fistula (07/11/2015)  . History of blood transfusion    "related to gallbladder OR"  . History of stomach ulcers   . Hyperlipidemia   . Hypertension   . Iron deficiency anemia   . Myocardial infarction ([place]) 10/2014  . Peripheral vascular disease ([place])   . Pneumonia   . Presence of permanent cardiac pacemaker   . 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 Type II diabetes mellitus ([place])    Past Surgical History:  Procedure Laterality Date  . AV FISTULA PLACEMENT Left 10/19/2014   Procedure: BRACHIOCEPHALIC ARTERIOVENOUS (AV) FISTULA CREATION ;  Surgeon: Conrad [place] Germany, MD;  Location: [place];  Service: Vascular;  Laterality: Left;  . CARDIAC CATHETERIZATION    . CARDIAC CATHETERIZATION N/A 07/22/2015   Procedure: Right/Left Heart Cath and Coronary Angiography;  Surgeon: [name], MD;  Location: [place] CV LAB;  Service: Cardiovascular;  Laterality: N/A;  . CATARACT EXTRACTION W/ INTRAOCULAR LENS  IMPLANT, BILATERAL Bilateral 1990's  . CHOLECYSTECTOMY OPEN  1980's  . COLONOSCOPY W/ BIOPSIES AND POLYPECTOMY    .  CORONARY ANGIOGRAPHY N/A 07/31/2018   Procedure: CORONARY ANGIOGRAPHY;  Surgeon: Martinique, Peter M, MD;  Location: [place] CV LAB;  Service: Cardiovascular;  Laterality: N/A;  . CORONARY ANGIOPLASTY    . CORONARY ATHERECTOMY N/A 04/18/2018   Procedure: CORONARY ATHERECTOMY;  Surgeon: [name], MD;  Location: [place] CV LAB;  Service: Cardiovascular;  Laterality: N/A;  . CORONARY BALLOON ANGIOPLASTY N/A 07/31/2018   Procedure: CORONARY BALLOON ANGIOPLASTY;  Surgeon: Martinique, Peter M, MD;  Location: [place] CV LAB;  Service: Cardiovascular;  Laterality: N/A;  . CORONARY STENT INTERVENTION N/A 04/18/2018    Procedure: CORONARY STENT INTERVENTION;  Surgeon: [name], MD;  Location: [place] CV LAB;  Service: Cardiovascular;  Laterality: N/A;  . EP IMPLANTABLE DEVICE N/A 07/11/2015   Procedure: Pacemaker Implant;  Surgeon: [name], MD;  Location: [place] CV LAB;  Service: Cardiovascular;  Laterality: N/A;  . ESOPHAGOGASTRODUODENOSCOPY  08/01/2012   Procedure: ESOPHAGOGASTRODUODENOSCOPY (EGD);  Surgeon: [name], MD;  Location: [facility];  Service: Endoscopy;  Laterality: N/A;  . INSERT / REPLACE / REMOVE PACEMAKER  07/11/2015  . INSERTION OF DIALYSIS CATHETER Right 02/02/2015   Procedure: INSERTION OF DIALYSIS CATHETER  RIGHT INTERNAL JUGULAR;  Surgeon: Mal [name], MD;  Location: [place];  Service: Vascular;  Laterality: Right;  . LEFT AND RIGHT HEART CATHETERIZATION WITH CORONARY ANGIOGRAM N/A 09/30/2014   Procedure: LEFT AND RIGHT HEART CATHETERIZATION WITH CORONARY ANGIOGRAM;  Surgeon: [name], MD;  Location: [facility] CATH LAB;  Service: Cardiovascular;  Laterality: N/A;  . LEFT HEART CATH AND CORONARY ANGIOGRAPHY N/A 04/14/2018   Procedure: LEFT HEART CATH AND CORONARY ANGIOGRAPHY;  Surgeon: [name], MD;  Location: [place] [facility];  Service: Cardiovascular;  Laterality: N/A;  . TEE WITHOUT CARDIOVERSION N/A 08/02/2015   Procedure: TRANSESOPHAGEAL ECHOCARDIOGRAM (TEE);  Surgeon: [name], MD;  Location: [place];  Service: Open Heart Surgery;  Laterality: N/A;  . TONSILLECTOMY    . TRANSCATHETER AORTIC VALVE REPLACEMENT, TRANSFEMORAL Left 08/02/2015   Procedure: TRANSCATHETER AORTIC VALVE REPLACEMENT, TRANSFEMORAL;  Surgeon: [name], MD;  Location: [place];  Service: Open Heart Surgery;  Laterality: Left;   Family History  Problem Relation Age of Onset  . Diabetes Father   . Heart disease Father   . Diabetes Sister   . Alzheimer's disease Mother   . Stroke Mother   . Diabetes Paternal Grandmother   . Alzheimer's disease Maternal Aunt         x 8 Maternal Aunts  . Cancer Maternal Uncle        type unknown   Social History:  reports that he quit smoking about 35 years ago. He has a 64.00 pack-year smoking history. He has never used smokeless tobacco. He reports current alcohol use. He reports that he does not use drugs. Allergies  Allergen Reactions  . Byetta 10 Mcg Pen [Exenatide] Diarrhea and Nausea And Vomiting  . Codeine Itching  . Coumadin [Warfarin Sodium] Rash   Prior to Admission medications   Medication Sig Start Date End Date Taking? Authorizing Provider  albuterol (PROVENTIL HFA;VENTOLIN HFA) 108 (90 Base) MCG/ACT inhaler Inhale 2 puffs into the lungs every 4 (four) hours as needed for wheezing or shortness of breath. 11/27/18   Tanda Rockers, MD  albuterol (PROVENTIL) (2.5 MG/3ML) 0.083% nebulizer solution Take 3 mLs (2.5 mg total) by nebulization every 6 (six) hours as needed for wheezing or shortness of breath. 01/07/19   Scot Jun, FNP  apixaban (ELIQUIS) 5 MG TABS tablet Take 1 tablet (5 mg total) by mouth 2 (two) times daily with a meal. 09/02/18   Deboraha Sprang, MD  atorvastatin (LIPITOR) 80 MG tablet Take 1 tablet (80 mg total) by mouth daily at 6 PM. 01/07/19   Scot Jun, FNP  budesonide-formoterol (SYMBICORT) 160-4.5 MCG/ACT inhaler Inhale 2 puffs into the lungs 2 (two) times daily. 11/27/18   Tanda Rockers, MD  cinacalcet (SENSIPAR) 30 MG tablet Take 30 mg by mouth daily with supper.    [provider]  clopidogrel (PLAVIX) 75 MG tablet Take 1 tablet (75 mg total) by mouth daily. 09/02/18   Martinique, Peter M, MD  erythromycin ophthalmic ointment Place 1 application into the right eye at bedtime. 01/13/19   Scot Jun, FNP  guaiFENesin (MUCINEX) 600 MG 12 hr tablet Take 1 tablet (600 mg total) by mouth 2 (two) times daily. 01/07/19   Scot Jun, FNP  hydrOXYzine (ATARAX/VISTARIL) 25 MG tablet Take 0.5-1 tablets (12.5-25 mg total) by mouth 2 (two) times daily as needed for  itching. 01/07/19   Scot Jun, FNP  insulin detemir (LEVEMIR) 100 UNIT/ML injection Inject 0.48 mLs (48 Units total) into the skin at bedtime. 01/07/19   Scot Jun, FNP  Insulin Pen Needle 31G X 6 MM MISC USE 1  THREE TIMES DAILY 08/28/18   [provider]  lidocaine-prilocaine (EMLA) cream Apply 1 application topically once as needed (prior to accessing port).     [provider]  loratadine (CLARITIN) 10 MG tablet Take 10 mg by mouth daily.    [provider]  metoprolol tartrate (LOPRESSOR) 25 MG tablet One half twice daily Patient taking differently: Take 12.5 mg by mouth 2 (two) times daily.  11/27/18   Tanda Rockers, MD  nitroGLYCERIN (NITROSTAT) 0.4 MG SL tablet Place 1 tablet (0.4 mg total) under the tongue every 5 (five) minutes as needed for chest pain. 04/19/18   Rai, Ripudeep K, MD  olopatadine (PATANOL) 0.1 % ophthalmic solution Place 1 drop into the right eye 2 (two) times daily. 01/13/19   Scot Jun, FNP  OXYGEN Inhale into the lungs. 2-3 lpm 24/7    [provider]  pantoprazole (PROTONIX) 40 MG tablet Take 1 tablet (40 mg total) by mouth daily. 08/29/18   Deboraha Sprang, MD  sevelamer carbonate (RENVELA) 800 MG tablet Take 1,600-4,000 mg by mouth See admin instructions. Take 5 tablets by mouth 3 times daily with meals and then 2 tablets twice daily with snacks     [provider]  triamcinolone cream (KENALOG) 0.1 % Apply 1 application topically 2 (two) times daily. 01/07/19   Scot Jun, FNP   Current Facility-Administered Medications  Medication Dose Route Frequency Provider Last Rate Last Dose  . acetaminophen (TYLENOL) tablet 650 mg  650 mg Oral Q6H PRN Rise Patience, MD       Or  . acetaminophen (TYLENOL) suppository 650 mg  650 mg Rectal Q6H PRN Rise Patience, MD      . albuterol (PROVENTIL) (2.5 MG/3ML) 0.083% nebulizer solution 2.5 mg  2.5 mg Nebulization Q4H PRN Rise Patience,  MD      . apixaban Arne [place]) tablet 2.5 mg  2.5 mg Oral BID Barb Merino, MD   2.5 mg at 02/20/19 1025  . atorvastatin (LIPITOR) tablet 80 mg  80 mg Oral q1800 Rise Patience, MD      . ceFEPIme (MAXIPIME) 1  g in sodium chloride 0.9 % 100 mL IVPB  1 g Intravenous Q24H Joselyn Glassman A, RPH      . cinacalcet (SENSIPAR) tablet 30 mg  30 mg Oral Q supper Rise Patience, MD      . clopidogrel (PLAVIX) tablet 75 mg  75 mg Oral Daily Rise Patience, MD   75 mg at 02/20/19 1025  . guaiFENesin (MUCINEX) 12 hr tablet 600 mg  600 mg Oral BID Rise Patience, MD   600 mg at 02/20/19 1025  . hydrOXYzine (ATARAX/VISTARIL) tablet 12.5-25 mg  12.5-25 mg Oral BID PRN Rise Patience, MD      . insulin aspart (novoLOG) injection 0-9 Units  0-9 Units Subcutaneous TID WC Rise Patience, MD   2 Units at 02/20/19 [phone]  . insulin detemir (LEVEMIR) injection 48 Units  48 Units Subcutaneous QHS Rise Patience, MD      . loratadine (CLARITIN) tablet 10 mg  10 mg Oral Daily Rise Patience, MD   10 mg at 02/20/19 1025  . metoprolol tartrate (LOPRESSOR) tablet 12.5 mg  12.5 mg Oral BID Rise Patience, MD      . metroNIDAZOLE (FLAGYL) IVPB 500 mg  500 mg Intravenous Q8H Barb Merino, MD 100 mL/hr at 02/20/19 1024 500 mg at 02/20/19 1024  . mometasone-formoterol (DULERA) 200-5 MCG/ACT inhaler 2 puff  2 puff Inhalation BID Rise Patience, MD      . nitroGLYCERIN (NITROSTAT) SL tablet 0.4 mg  0.4 mg Sublingual Q5 min PRN Rise Patience, MD      . olopatadine (PATANOL) 0.1 % ophthalmic solution 1 drop  1 drop Right Eye BID Rise Patience, MD      . ondansetron [facility]) tablet 4 mg  4 mg Oral Q6H PRN Rise Patience, MD       Or  . ondansetron [facility]) injection 4 mg  4 mg Intravenous Q6H PRN Rise Patience, MD      . pantoprazole (PROTONIX) EC tablet 40 mg  40 mg Oral Daily Rise Patience, MD   40 mg at 02/20/19 1025  . sevelamer carbonate  (RENVELA) tablet 1,600 mg  1,600 mg Oral BID BM PRN Rise Patience, MD      . sevelamer carbonate (RENVELA) tablet 4,000 mg  4,000 mg Oral TID WC Rise Patience, MD   4,000 mg at 02/20/19 0841   Labs: Basic Metabolic Panel: Recent Labs  Lab 02/20/19 0026 02/20/19 0650  NA 135 135  K 4.3 4.7  CL 94* 94*  CO2 26 24  GLUCOSE 115* 167*  BUN 16 20  CREATININE 3.27* 3.59*  CALCIUM 8.6* 8.5*   Liver Function Tests: Recent Labs  Lab 02/20/19 0650  AST 24  ALT 17  ALKPHOS 92  BILITOT 1.6*  PROT 6.3*  ALBUMIN 2.9*   No results for input(s): LIPASE, AMYLASE in the last 168 hours. No results for input(s): AMMONIA in the last 168 hours. CBC: Recent Labs  Lab 02/20/19 0026 02/20/19 0650  WBC 9.3 9.4  NEUTROABS 6.4 6.7  HGB 10.2* 10.2*  HCT 34.6* 33.7*  MCV 96.1 98.3  PLT 197 201   Cardiac Enzymes: No results for input(s): CKTOTAL, CKMB, CKMBINDEX, TROPONINI in the last 168 hours. CBG: Recent Labs  Lab 02/20/19 0753  GLUCAP 159*   Iron Studies: No results for input(s): IRON, TIBC, TRANSFERRIN, FERRITIN in the last 72 hours. Studies/Results: Ct Angio Low Extrem Left W &/or Wo Contrast  Result Date: 02/20/2019 CLINICAL DATA:  Cold left foot, sudden onset, vascular compromise suspected. There is also history of diabetes with left-sided foot discoloration becoming worse over 2 months. EXAM: CT ANGIOGRAPHY LOWER LEFT EXTREMITY TECHNIQUE: Arterially timed CTA of the left lower extremity was performed after bolus administration of intravenous contrast. CONTRAST:  155mL OMNIPAQUE IOHEXOL 350 MG/ML SOLN COMPARISON:  None similar FINDINGS: Iliacs: Atherosclerotic calcification without dissection or flow limiting stenosis. Common femoral: Atherosclerosis without flow limiting stenosis or dissection. Superficial femoral artery: Heavily calcified main trunk and multiple orders of downstream branches which are not well evaluated due to the degree of calcified plaque blooming  relative to the lumen size. There is up to 70% atheromatous stenosis in the distal SFA due to bulky calcified plaque. No acute occlusion is seen. Popliteal: Heavily calcified wall with diffuse mild to moderate narrowing. Leg: The tibioperoneal trunk and proximal peroneal artery is patent. Below the upper calf vessels are heavily calcified such that luminal density is not confidently measurable. There is no definite flow within branches of the foot. Nonvascular: Fatty left inguinal hernia. Enlarged prostate with bladder wall thickening. Small volume pelvic ascites. Diffuse subcutaneous edema in the left lower extremity without soft tissue gas or abscess. No bony erosion or joint destruction. Advanced knee osteoarthritis. Review of the MIP images confirms the above findings. IMPRESSION: Heavily and diffusely calcified arteries which precludes luminal visualization beyond the upper calf. No acute occlusion is seen. There is extensive moderate to advanced atheromatous narrowing of the SFA and popliteal arteries. Electronically Signed   By: Monte Fantasia M.D.   On: 02/20/2019 04:29   Dg Chest Port 1 View  Result Date: 02/20/2019 CLINICAL DATA:  Initial evaluation for elevated lactate. EXAM: PORTABLE CHEST 1 VIEW COMPARISON:  Prior radiograph from 11/29/2018 FINDINGS: Right-sided pacemaker/AICD in place. Advanced cardiomegaly, stable. Mediastinal silhouette normal. Aortic atherosclerosis. Lungs mildly hypoinflated. Diffuse pulmonary vascular and interstitial congestion, consistent with mild diffuse pulmonary interstitial edema. No consolidative space opacity. No pneumothorax. Probable trace right pleural effusion. Osseous structures within normal limits. IMPRESSION: 1. Cardiomegaly with mild diffuse pulmonary interstitial edema and small right pleural effusion. 2. Aortic atherosclerosis. Electronically Signed   By: Jeannine Boga M.D.   On: 02/20/2019 03:54   Dg Foot Complete Left  Result Date:  02/20/2019 CLINICAL DATA:  Bruising and discoloration for 2 days. EXAM: LEFT FOOT - COMPLETE 3+ VIEW COMPARISON:  Left foot radiograph 01/13/2009 FINDINGS: Hammertoe deformity of the digits which limits assessment. No fracture, dislocation, or bony destructive change. Midfoot osteoarthritis. Probable remote fracture of the third proximal metatarsal, unchanged in appearance from prior exam. Small plantar calcaneal spur. There are dense vascular calcifications. Soft tissue edema noted about the dorsum of the foot. No soft tissue air. IMPRESSION: 1. Dorsal soft tissue edema.  No acute osseous abnormality. 2. Midfoot osteoarthritis. Hammertoe deformity of the digits limits assessment. Electronically Signed   By: Keith Rake M.D.   On: 02/20/2019 00:25    ROS: As per HPI otherwise negative.  Physical Exam: Vitals:   02/20/19 0600 02/20/19 0630 02/20/19 0700 02/20/19 0809  BP: 98/62  (!) 91/46 (!) 74/54  Pulse: (!) 55 (!) 54 (!) 54 (!) 54  Resp: 16  14 20   Temp:    97.6 F (36.4 C)  TempSrc:    Oral  SpO2: 98% 100% 100% 100%     General: elderly chronically ill male NAD on O2 Head: NCAT sclera not icteric MMM Neck: Supple. PM right upper chest Lungs:  +  rales Breathing is unlabored sitting on side of bed Heart:  Bradycardic - generally in 50s at home HD unit Abdomen: soft NT + BS  Lower extremities: L > R significant LE edema to knees with ^ rubor venous stasis changes Neuro: A & O  X 3. Moves all extremities spontaneously. Psych:  Responds to questions appropriately with a normal affect. Dialysis Access: left upper AVF + bruit  Dialysis Orders: [facility]  TueThuSat, 4 hrs 0 min, Optiflux 200NRe, BFR 400, DFR Manual 800 mL/min, EDW 104 (kg), Dialysate 2.0 K, 2.25 Ca, 1.0 Mg, 100 Dextrose (G2231), Sodium 137 (mEq/L), Bicarb Setting: 35 (mEq/L), UFR Profile: Profile 2, Sodium Model: None, Access: AVFistula Mircera 225 q 2 weeks last 3/24 venofer 50 hectorol 1  Assessment/Plan: 1.  LE  swelling ? Cellulitis L > R vs venous stasis changes- per primary, extra tmt today to try to lower volume - on empiric flagyl and maxipime  2.  ESRD -  TTS - serial HD to lower volume -extra tmt today 3.  Hypertension/volume  - BP limiting UF - will need serial HD to get volume down. Midodrine started mid March for BP support; given his degree of edema/CXR - suspect EDW needs to actually be lowered 4.  Anemia  - hgb 10.2 stable  - not due for ESA this week 5.  Metabolic bone disease -  Continue hectorol /sensipar/renvela 6.  Nutrition renal carb mod diet/vit 7.  Hx CAD/TAVR/afib s/p PM, TAVR, stents - using NTG daily by his report can't lie flat in bed - lower volume  8.  COPD 9.  Loose stools x 1 week - follow -   Myriam Jacobson, PA-C [facility] [phone] 02/20/2019, 11:04 AM    I have seen and examined this patient and agree with plan and assessment in the above note with renal recommendations/intervention highlighted.  Seen on HD.  Plan to UF as tolerated to help with edema. Jeffrey John A Eean Buss,MD 02/20/2019 1:24 PM

## 2019-02-20 NOTE — ED Notes (Signed)
[phone] pts daughter [name] would like a call back for an update of pt

## 2019-02-20 NOTE — ED Notes (Signed)
[phone] pts daughter [name] would like a call back asap on update of pt

## 2019-02-20 NOTE — Progress Notes (Signed)
Patient was seen and examined at the bedside.  I went urgently see the patient with low blood pressure when he arrived to the medical floor. 78 year old gentleman with history of coronary artery disease status post stenting, ESRD on hemodialysis TTS schedule, type 2 diabetes on insulin, chronic anemia, pacemaker, status post TAVR, A. fib on Eliquis presented to the emergency room with 3 to 4 days of lower extremity swelling, dusky induration, right lateral malleoli draining wound.  He does have history of peripheral vascular disease, he does have peripheral neuropathy.  In the emergency room, blood pressures has been low, however inconsistent.  Patient is afebrile.  Does not have any evidence of hemodynamic compromise.  Blood pressures difficult to assess, only using the right arm.  Unable to take on the left arm.  He is sitting in a chair and has no dizziness or lightheadedness.  1.  Bilateral foot discoloration/swelling erythema: CT arteriogram was negative for acute vascular obstruction.  Patient probably had diffuse small vessel disease. Suspect cellulitis with induration and change in skin color.  It is mostly equal bilaterally. Also has minimal secreting wound on the right lateral malleoli. Peripheral pulses are dopplerable. Send for blood cultures.  Diabetic patient on dialysis, will start patient on broad-spectrum IV antibiotics to treat for cellulitis.  Risk of MRSA, Pseudomonas present. I called and discussed case with vascular surgery, they will see patient in follow-up.  Do not anticipate any urgent intervention.  We will continue Plavix and Eliquis that he is taking. ABIs ordered.  2.  Rate controlled A. fib status post pacemaker: Anticoagulated.  3.  Hypertension/lactic acidosis: Patient is asymptomatic with low blood pressure.  Probably erroneous reading.  We will continue to monitor.  He has received 1 L of normal saline in the ER.  4.  ESRD on hemodialysis: Currently stable.  Will  need dialysis tomorrow.  I notified his nephrologist.  Picture taken with patient's permission of his both foot.

## 2019-02-20 NOTE — ED Notes (Signed)
Patient transported to X-ray 

## 2019-02-20 NOTE — Consult Note (Addendum)
Hospital Consult  VASCULAR SURGERY ASSESSMENT & PLAN:   DISCOLORATION OF BOTH FEET: Based on his recent noninvasive assessment I think he has adequate arterial perfusion.  Certainly given his age and debilitated state I would not recommend an aggressive approach.  He does have evidence of underlying chronic venous insufficiency and he would benefit from some mild compression with 4 inch Ace bandages and leg elevation.  No further vascular work-up indicated at this time.  [name], MD, [place] [phone] Office: [phone]   Reason for Consult:  Discoloration of feet Requesting Physician:  Dr. Sloan Leiter MRN #:  [id number]  History of Present Illness: This is a 78 y.o. male seen on hemodialysis who was admitted to the hospital this morning due to 1 week history of edema and discoloration of bilateral lower extremities.  Extensive past medical history significant for CAD with most recent coronary stenting 07/2018, atrial fibrillation with pacemaker and Eliquis, type 2 diabetes mellitus on insulin, and end-stage renal disease on hemodialysis via left arm AV fistula.  He is being seen in consultation for evaluation of the discoloration of his feet.  He is also noted to have a pressure ulceration of right lateral malleolus the patient has not noticed before today.   Chart review demonstrates ABIs performed in January of this year demonstrating noncompressible tibial vessels however showed biphasic waveforms in PT and DP arteries bilaterally.  Work-up since admission also included CT angiogram of aorta and runoff to the left lower extremity.  He appears to have at least 2 vessel runoff to his foot with a patent SFA and popliteal.  Also noted on CTA images is a patent right PTA into his foot.  Patient denies any pain in his feet.  He states he lives with his wife and son and states has not walked over the past 4 months due to a fall with head trauma.  He is taking aspirin and statin per cardiology.   It should also be noted that patient is a poor historian and somnolent on exam needing to be awoken several times.  Past Medical History:  Diagnosis Date   Anginal pain ([place])    Aortic stenosis    a. severe by echo 09/2014   Atrial fibrillation ([place])    a. not well documented, not on anticoagulation   CHF (congestive heart failure) ([place])    04/28/17 echo-EF 40%, mod LVH, diastolic dysfunction   Claustrophobia    Complete heart block (H[facility])    COPD (chronic obstructive pulmonary disease) ([place])    Coronary artery disease    a. chronically occluded RCA per cath 09/2014 with collaterals B. cath 05/01/17 chr occ RCA w/collaterals, 60-70% mid LAD,    CVA (cerebral vascular accident) ([place]) 10/2014   denies residual on 07/11/2015   ESRD (end stage renal disease) on dialysis [facility])    a. 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   29 mm Edwards Sapien 3 transcatheter heart valve placed via open left transfemoral approach   Type II diabetes mellitus ([place])     Past Surgical History:  Procedure Laterality Date  AV FISTULA PLACEMENT Left 10/19/2014   Procedure: BRACHIOCEPHALIC ARTERIOVENOUS (AV) FISTULA CREATION ;  Surgeon: Conrad [place], MD;  Location: [place];  Service: Vascular;  Laterality: Left;   CARDIAC CATHETERIZATION     CARDIAC CATHETERIZATION N/A 07/22/2015   Procedure: Right/Left Heart Cath and Coronary Angiography;  Surgeon: [name], MD;  Location: [place] CV LAB;  Service: Cardiovascular;  Laterality: N/A;   CATARACT EXTRACTION W/ INTRAOCULAR LENS  IMPLANT, BILATERAL Bilateral 1990's    CHOLECYSTECTOMY OPEN  1980's   COLONOSCOPY W/ BIOPSIES AND POLYPECTOMY     CORONARY ANGIOGRAPHY N/A 07/31/2018   Procedure: CORONARY ANGIOGRAPHY;  Surgeon: Martinique, Peter M, MD;  Location: [place] CV [facility];  Service: Cardiovascular;  Laterality: N/A;   CORONARY ANGIOPLASTY     CORONARY ATHERECTOMY N/A 04/18/2018   Procedure: CORONARY ATHERECTOMY;  Surgeon: [name], MD;  Location: [place] CV [facility];  Service: Cardiovascular;  Laterality: N/A;   CORONARY BALLOON ANGIOPLASTY N/A 07/31/2018   Procedure: CORONARY BALLOON ANGIOPLASTY;  Surgeon: Martinique, Peter M, MD;  Location: [place] CV LAB;  Service: Cardiovascular;  Laterality: N/A;   CORONARY STENT INTERVENTION N/A 04/18/2018   Procedure: CORONARY STENT INTERVENTION;  Surgeon: [name], MD;  Location: [place] [facility];  Service: Cardiovascular;  Laterality: N/A;   EP IMPLANTABLE DEVICE N/A 07/11/2015   Procedure: Pacemaker Implant;  Surgeon: [name], MD;  Location: [place] [facility];  Service: Cardiovascular;  Laterality: N/A;   ESOPHAGOGASTRODUODENOSCOPY  08/01/2012   Procedure: ESOPHAGOGASTRODUODENOSCOPY (EGD);  Surgeon: [name], MD;  Location: [facility];  Service: Endoscopy;  Laterality: N/A;   INSERT / REPLACE / REMOVE PACEMAKER  07/11/2015   INSERTION OF DIALYSIS CATHETER Right 02/02/2015   Procedure: INSERTION OF DIALYSIS CATHETER  RIGHT INTERNAL JUGULAR;  Surgeon: Mal [name], MD;  Location: [place];  Service: Vascular;  Laterality: Right;   LEFT AND RIGHT HEART CATHETERIZATION WITH CORONARY ANGIOGRAM N/A 09/30/2014   Procedure: LEFT AND RIGHT HEART CATHETERIZATION WITH CORONARY ANGIOGRAM;  Surgeon: [name], MD;  Location[facility] CATH LAB;  Service: Cardiovascular;  Laterality: N/A;   LEFT HEART CATH AND CORONARY ANGIOGRAPHY N/A 04/14/2018   Procedure: LEFT HEART CATH AND CORONARY ANGIOGRAPHY;  Surgeon: [name], MD;  Location: [place] CV [facility];  Service: Cardiovascular;   Laterality: N/A;   TEE WITHOUT CARDIOVERSION N/A 08/02/2015   Procedure: TRANSESOPHAGEAL ECHOCARDIOGRAM (TEE);  Surgeon: [name], MD;  Location: [place];  Service: Open Heart Surgery;  Laterality: N/A;   TONSILLECTOMY     TRANSCATHETER AORTIC VALVE REPLACEMENT, TRANSFEMORAL Left 08/02/2015   Procedure: TRANSCATHETER AORTIC VALVE REPLACEMENT, TRANSFEMORAL;  Surgeon: [name], MD;  Location: [place];  Service: Open Heart Surgery;  Laterality: Left;    Allergies  Allergen Reactions   Byetta 10 Mcg Pen [Exenatide] Diarrhea and Nausea And Vomiting   Codeine Itching   Coumadin [Warfarin Sodium] Rash    Prior to Admission medications   Medication Sig Start Date End Date Taking? Authorizing Provider  albuterol (PROVENTIL HFA;VENTOLIN HFA) 108 (90 Base) MCG/ACT inhaler Inhale 2 puffs into the lungs every 4 (four) hours as needed for wheezing or shortness of breath. 11/27/18   Tanda Rockers, MD  albuterol (PROVENTIL) (2.5 MG/3ML) 0.083% nebulizer solution Take 3 mLs (2.5 mg total) by nebulization every 6 (six) hours as needed for wheezing or shortness of breath. 01/07/19   Scot Jun, FNP  apixaban (ELIQUIS) 5 MG TABS tablet Take 1 tablet (5 mg  total) by mouth 2 (two) times daily with a meal. 09/02/18   Deboraha Sprang, MD  atorvastatin (LIPITOR) 80 MG tablet Take 1 tablet (80 mg total) by mouth daily at 6 PM. 01/07/19   Scot Jun, FNP  budesonide-formoterol (SYMBICORT) 160-4.5 MCG/ACT inhaler Inhale 2 puffs into the lungs 2 (two) times daily. 11/27/18   Tanda Rockers, MD  cinacalcet (SENSIPAR) 30 MG tablet Take 30 mg by mouth daily with supper.    [provider]  clopidogrel (PLAVIX) 75 MG tablet Take 1 tablet (75 mg total) by mouth daily. 09/02/18   Martinique, Peter M, MD  erythromycin ophthalmic ointment Place 1 application into the right eye at bedtime. 01/13/19   Scot Jun, FNP  guaiFENesin (MUCINEX) 600 MG 12 hr tablet Take 1 tablet (600 mg total) by mouth  2 (two) times daily. 01/07/19   Scot Jun, FNP  hydrOXYzine (ATARAX/VISTARIL) 25 MG tablet Take 0.5-1 tablets (12.5-25 mg total) by mouth 2 (two) times daily as needed for itching. 01/07/19   Scot Jun, FNP  insulin detemir (LEVEMIR) 100 UNIT/ML injection Inject 0.48 mLs (48 Units total) into the skin at bedtime. 01/07/19   Scot Jun, FNP  Insulin Pen Needle 31G X 6 MM MISC USE 1  THREE TIMES DAILY 08/28/18   [provider]  lidocaine-prilocaine (EMLA) cream Apply 1 application topically once as needed (prior to accessing port).     [provider]  loratadine (CLARITIN) 10 MG tablet Take 10 mg by mouth daily.    [provider]  metoprolol tartrate (LOPRESSOR) 25 MG tablet One half twice daily Patient taking differently: Take 12.5 mg by mouth 2 (two) times daily.  11/27/18   Tanda Rockers, MD  nitroGLYCERIN (NITROSTAT) 0.4 MG SL tablet Place 1 tablet (0.4 mg total) under the tongue every 5 (five) minutes as needed for chest pain. 04/19/18   Rai, Ripudeep K, MD  olopatadine (PATANOL) 0.1 % ophthalmic solution Place 1 drop into the right eye 2 (two) times daily. 01/13/19   Scot Jun, FNP  OXYGEN Inhale into the lungs. 2-3 lpm 24/7    [provider]  pantoprazole (PROTONIX) 40 MG tablet Take 1 tablet (40 mg total) by mouth daily. 08/29/18   Deboraha Sprang, MD  sevelamer carbonate (RENVELA) 800 MG tablet Take 1,600-4,000 mg by mouth See admin instructions. Take 5 tablets by mouth 3 times daily with meals and then 2 tablets twice daily with snacks     [provider]  triamcinolone cream (KENALOG) 0.1 % Apply 1 application topically 2 (two) times daily. 01/07/19   Scot Jun, FNP    Social History   Socioeconomic History   Marital status: Married    Spouse name: Not on file   Number of children: 3   Years of education: Not on file   Highest education level: Not on file  Occupational History   Occupation:  retired  Scientist, product/process development strain: Not on file   Food insecurity:    Worry: Not on file    Inability: Not on file   Transportation needs:    Medical: Not on file    Non-medical: Not on file  Tobacco Use   Smoking status: Former Smoker    Packs/day: 2.00    Years: 32.00    Pack years: 64.00    Last attempt to quit: 11/27/1983    Years since quitting: 35.2   Smokeless tobacco: Never  Used  Substance and Sexual Activity   Alcohol use: Yes    Comment: rarely   Drug use: No   Sexual activity: Yes    Birth control/protection: None  Lifestyle   Physical activity:    Days per week: Not on file    Minutes per session: Not on file   Stress: Not on file  Relationships   Social connections:    Talks on phone: Not on file    Gets together: Not on file    Attends religious service: Not on file    Active member of club or organization: Not on file    Attends meetings of clubs or organizations: Not on file    Relationship status: Not on file   Intimate partner violence:    Fear of current or ex partner: Not on file    Emotionally abused: Not on file    Physically abused: Not on file    Forced sexual activity: Not on file  Other Topics Concern   Not on file  Social History Narrative   Not on file     Family History  Problem Relation Age of Onset   Diabetes Father    Heart disease Father    Diabetes Sister    Alzheimer's disease Mother    Stroke Mother    Diabetes Paternal Grandmother    Alzheimer's disease Maternal Aunt        x 8 Maternal Aunts   Cancer Maternal Uncle        type unknown    ROS: Otherwise negative unless mentioned in HPI  Physical Examination  Vitals:   02/20/19 1331 02/20/19 1400  BP: (!) 75/37 (!) 77/35  Pulse: (!) 53 (!) 54  Resp:    Temp:    SpO2:     Body mass index is 32.9 kg/m.  General:  WDWN in NAD Gait: Not observed HENT: WNL, normocephalic Pulmonary: normal non-labored breathing on  [place] Cardiac: regular Abdomen: soft, NT/ND, no masses Skin: without rashes Vascular Exam/Pulses: palpable femoral pulses L >R; R DP and PT brisk by doppler; L DP and PT brisk by doppler Extremities: some low intensity erythema B feet however not impressive; R lateral mallelous early pressure ulcer; discoloration dorsal aspect of L foot but no open wounds or obvious infection Musculoskeletal: no muscle wasting or atrophy  Neurologic: somnolent Psychiatric:  The pt has Normal affect. Lymph:  Unremarkable  CBC    Component Value Date/Time   WBC 9.4 02/20/2019 0650   RBC 3.43 (L) 02/20/2019 0650   HGB 10.2 (L) 02/20/2019 0650   HGB 9.2 (L) 01/07/2019 1108   HCT 33.7 (L) 02/20/2019 0650   HCT 29.6 (L) 01/07/2019 1108   PLT 201 02/20/2019 0650   PLT 211 01/07/2019 1108   MCV 98.3 02/20/2019 0650   MCV 93 01/07/2019 1108   MCH 29.7 02/20/2019 0650   MCHC 30.3 02/20/2019 0650   RDW 17.2 (H) 02/20/2019 0650   RDW 17.3 (H) 01/07/2019 1108   LYMPHSABS 1.1 02/20/2019 0650   LYMPHSABS 1.4 01/07/2019 1108   MONOABS 1.3 (H) 02/20/2019 0650   EOSABS 0.3 02/20/2019 0650   EOSABS 0.1 01/07/2019 1108   BASOSABS 0.1 02/20/2019 0650   BASOSABS 0.0 01/07/2019 1108    BMET    Component Value Date/Time   NA 135 02/20/2019 0650   NA 137 01/07/2019 1108   K 4.7 02/20/2019 0650   CL 94 (L) 02/20/2019 0650   CO2 24 02/20/2019 0650   GLUCOSE 167 (  H) 02/20/2019 0650   BUN 20 02/20/2019 0650   BUN 29 (H) 01/07/2019 1108   CREATININE 3.59 (H) 02/20/2019 0650   CALCIUM 8.5 (L) 02/20/2019 0650   GFRNONAA 15 (L) 02/20/2019 0650   GFRAA 17 (L) 02/20/2019 0650    COAGS: Lab Results  Component Value Date   INR 2.4 (H) 02/20/2019   INR 2.5 12/19/2017   INR 2.2 11/21/2017     Non-Invasive Vascular Imaging:   CTA aorta with LLE runoff  IMPRESSION: Heavily and diffusely calcified arteries which precludes luminal visualization beyond the upper calf. No acute occlusion is seen. There is  extensive moderate to advanced atheromatous narrowing of the SFA and popliteal arteries.   ASSESSMENT/PLAN: This is a 78 y.o. male with discoloration of feet in patient with complicated medical history/nonambulatory  -No acute limb ischemia based on physical exam and imaging studies -CTA demonstrating runoff to left foot via at least 2 tibial vessels with a patent iliac, femoral, SFA, and popliteal artery -I do not believe an ABI study would add any value given that tibial vessels were noncompressible only 2 months prior -Agree with IV antibiotics for now; unsure of utility of angiography given physical exam findings as well as CTA results On-call vascular surgeon Dr. Scot Dock will evaluate the patient later today   Dagoberto Ligas PA-C [facility] [phone]

## 2019-02-21 ENCOUNTER — Observation Stay (HOSPITAL_COMMUNITY): Payer: Medicare Other

## 2019-02-21 DIAGNOSIS — Z952 Presence of prosthetic heart valve: Secondary | ICD-10-CM

## 2019-02-21 DIAGNOSIS — I5043 Acute on chronic combined systolic (congestive) and diastolic (congestive) heart failure: Secondary | ICD-10-CM | POA: Diagnosis present

## 2019-02-21 DIAGNOSIS — I252 Old myocardial infarction: Secondary | ICD-10-CM | POA: Diagnosis not present

## 2019-02-21 DIAGNOSIS — G609 Hereditary and idiopathic neuropathy, unspecified: Secondary | ICD-10-CM

## 2019-02-21 DIAGNOSIS — N186 End stage renal disease: Secondary | ICD-10-CM | POA: Diagnosis present

## 2019-02-21 DIAGNOSIS — M79671 Pain in right foot: Secondary | ICD-10-CM | POA: Diagnosis not present

## 2019-02-21 DIAGNOSIS — I4891 Unspecified atrial fibrillation: Secondary | ICD-10-CM | POA: Diagnosis present

## 2019-02-21 DIAGNOSIS — R7989 Other specified abnormal findings of blood chemistry: Secondary | ICD-10-CM | POA: Diagnosis not present

## 2019-02-21 DIAGNOSIS — I442 Atrioventricular block, complete: Secondary | ICD-10-CM | POA: Diagnosis present

## 2019-02-21 DIAGNOSIS — L03116 Cellulitis of left lower limb: Secondary | ICD-10-CM | POA: Diagnosis present

## 2019-02-21 DIAGNOSIS — N189 Chronic kidney disease, unspecified: Secondary | ICD-10-CM

## 2019-02-21 DIAGNOSIS — E11628 Type 2 diabetes mellitus with other skin complications: Secondary | ICD-10-CM | POA: Diagnosis present

## 2019-02-21 DIAGNOSIS — I25118 Atherosclerotic heart disease of native coronary artery with other forms of angina pectoris: Secondary | ICD-10-CM | POA: Diagnosis present

## 2019-02-21 DIAGNOSIS — M79672 Pain in left foot: Secondary | ICD-10-CM | POA: Diagnosis not present

## 2019-02-21 DIAGNOSIS — Z992 Dependence on renal dialysis: Secondary | ICD-10-CM | POA: Diagnosis not present

## 2019-02-21 DIAGNOSIS — E1122 Type 2 diabetes mellitus with diabetic chronic kidney disease: Secondary | ICD-10-CM | POA: Diagnosis present

## 2019-02-21 DIAGNOSIS — L819 Disorder of pigmentation, unspecified: Secondary | ICD-10-CM | POA: Diagnosis not present

## 2019-02-21 DIAGNOSIS — D638 Anemia in other chronic diseases classified elsewhere: Secondary | ICD-10-CM | POA: Diagnosis not present

## 2019-02-21 DIAGNOSIS — E872 Acidosis: Secondary | ICD-10-CM | POA: Diagnosis present

## 2019-02-21 DIAGNOSIS — I5042 Chronic combined systolic (congestive) and diastolic (congestive) heart failure: Secondary | ICD-10-CM

## 2019-02-21 DIAGNOSIS — R609 Edema, unspecified: Secondary | ICD-10-CM | POA: Diagnosis not present

## 2019-02-21 DIAGNOSIS — E11649 Type 2 diabetes mellitus with hypoglycemia without coma: Secondary | ICD-10-CM | POA: Diagnosis not present

## 2019-02-21 DIAGNOSIS — I132 Hypertensive heart and chronic kidney disease with heart failure and with stage 5 chronic kidney disease, or end stage renal disease: Secondary | ICD-10-CM | POA: Diagnosis present

## 2019-02-21 DIAGNOSIS — E1151 Type 2 diabetes mellitus with diabetic peripheral angiopathy without gangrene: Secondary | ICD-10-CM | POA: Diagnosis present

## 2019-02-21 DIAGNOSIS — I350 Nonrheumatic aortic (valve) stenosis: Secondary | ICD-10-CM | POA: Diagnosis present

## 2019-02-21 DIAGNOSIS — D631 Anemia in chronic kidney disease: Secondary | ICD-10-CM

## 2019-02-21 DIAGNOSIS — D509 Iron deficiency anemia, unspecified: Secondary | ICD-10-CM | POA: Diagnosis present

## 2019-02-21 DIAGNOSIS — M7121 Synovial cyst of popliteal space [Baker], right knee: Secondary | ICD-10-CM | POA: Diagnosis present

## 2019-02-21 DIAGNOSIS — I70209 Unspecified atherosclerosis of native arteries of extremities, unspecified extremity: Secondary | ICD-10-CM

## 2019-02-21 DIAGNOSIS — Z961 Presence of intraocular lens: Secondary | ICD-10-CM | POA: Diagnosis present

## 2019-02-21 DIAGNOSIS — Z955 Presence of coronary angioplasty implant and graft: Secondary | ICD-10-CM | POA: Diagnosis not present

## 2019-02-21 DIAGNOSIS — N2581 Secondary hyperparathyroidism of renal origin: Secondary | ICD-10-CM | POA: Diagnosis present

## 2019-02-21 DIAGNOSIS — F40240 Claustrophobia: Secondary | ICD-10-CM | POA: Diagnosis present

## 2019-02-21 DIAGNOSIS — I878 Other specified disorders of veins: Secondary | ICD-10-CM | POA: Diagnosis present

## 2019-02-21 LAB — GLUCOSE, CAPILLARY
Glucose-Capillary: 52 mg/dL — ABNORMAL LOW (ref 70–99)
Glucose-Capillary: 56 mg/dL — ABNORMAL LOW (ref 70–99)
Glucose-Capillary: 117 mg/dL — ABNORMAL HIGH (ref 70–99)
Glucose-Capillary: 125 mg/dL — ABNORMAL HIGH (ref 70–99)
Glucose-Capillary: 91 mg/dL (ref 70–99)
Glucose-Capillary: 97 mg/dL (ref 70–99)
Glucose-Capillary: 127 mg/dL — ABNORMAL HIGH (ref 70–99)

## 2019-02-21 LAB — C-REACTIVE PROTEIN: CRP: 10.6 mg/dL — ABNORMAL HIGH (ref <1.0)

## 2019-02-21 MED ORDER — SODIUM CHLORIDE 0.9 % IV SOLN: 100.0000 mL | INTRAVENOUS | Status: DC | PRN

## 2019-02-21 MED ORDER — INSULIN DETEMIR 100 UNIT/ML ~~LOC~~ SOLN
38.0000 [IU] | Freq: Every day | SUBCUTANEOUS | Status: DC
  Administered 2019-02-21: 38 [IU] via SUBCUTANEOUS
  Filled 2019-02-21: qty 0.38

## 2019-02-21 MED ORDER — LIDOCAINE HCL (PF) 1 % IJ SOLN: 5.0000 mL | INTRAMUSCULAR | Status: DC | PRN

## 2019-02-21 MED ORDER — PRO-STAT SUGAR FREE PO LIQD
30.0000 mL | Freq: Two times a day (BID) | ORAL | Status: DC
  Administered 2019-02-21 – 2019-02-23 (×4): 30 mL via ORAL
  Filled 2019-02-21 (×6): qty 30

## 2019-02-21 MED ORDER — MIDODRINE HCL 5 MG PO TABS
ORAL_TABLET | ORAL | Status: AC
  Administered 2019-02-21: 10 mg via ORAL
  Filled 2019-02-21: qty 2

## 2019-02-21 MED ORDER — SODIUM CHLORIDE 0.9 % IV SOLN
1.0000 g | INTRAVENOUS | Status: DC
  Administered 2019-02-21: 1 g via INTRAVENOUS
  Filled 2019-02-21: qty 1

## 2019-02-21 MED ORDER — ALTEPLASE 2 MG IJ SOLR: 2.0000 mg | Freq: Once | INTRAMUSCULAR | Status: DC | PRN

## 2019-02-21 MED ORDER — DOXERCALCIFEROL 4 MCG/2ML IV SOLN
INTRAVENOUS | Status: AC
  Filled 2019-02-21: qty 2

## 2019-02-21 MED ORDER — PENTAFLUOROPROP-TETRAFLUOROETH EX AERO: 1.0000 "application " | INHALATION_SPRAY | CUTANEOUS | Status: DC | PRN

## 2019-02-21 MED ORDER — LIDOCAINE-PRILOCAINE 2.5-2.5 % EX CREA: 1.0000 "application " | TOPICAL_CREAM | CUTANEOUS | Status: DC | PRN

## 2019-02-21 NOTE — Progress Notes (Signed)
*  PRELIMINARY RESULTS* Vascular Ultrasound Bilateral lower extremity venous duplex has been completed.  Please see CV Proc tab for preliminary results.  Jeffrey [name] 02/21/2019, 4:40 PM

## 2019-02-21 NOTE — Progress Notes (Addendum)
Progress Note    Jeffrey [name]  GGY:[id number] DOB: Jan 28, 1941  DOA: 02/19/2019 PCP: Scot Jun, FNP    Brief Narrative:   Chief complaint: Discoloration of the feet  Medical records reviewed and are as summarized below:  Jeffrey [name] is an 78 y.o. male  with pmh of CAD s/p stenting, CHF,s/p TAVR,  s/p PM, ESRD on HD T/Th/Sat, DM type II, anemia; who presented with complaints of discoloration of the left foot.  Assessment/Plan:   Principal Problem:   Discoloration of skin of foot Active Problems:   Type 2 diabetes mellitus with chronic kidney disease on chronic dialysis, with long-term current use of insulin (HCC)   Essential hypertension   ESRD (end stage renal disease) on dialysis (HCC)   Chronic combined systolic and diastolic CHF (congestive heart failure) (HCC)   S/P TAVR (transcatheter aortic valve replacement)   Atrial fibrillation (HCC) [I48.91]   Discoloration of skin of multiple sites of lower extremity  Suspect cellulitis: Patient presents with lower extremity discoloration and erythema.  X-rays revealed subcutaneous dorsal soft tissue edema.  Lactic acid noted to be elevated which there was concern for the possibility of infection.  Patient had been started on empiric antibiotics of cefepime and metronidazole.  Blood cultures have remained negative. -Continue cefepime and metronidazole -Check CRP -Follow-up Doppler ultrasound of the lower extremities -Continue to monitor for spreading of possible infection  Lactic acidosis: Acute.  Lactic acid levels trended up to 3.3.  Suspect this could be related with possibility of infection versus poor perfusion given CHF.  -Continue to monitor  Diabetes mellitus type 2 with hyperglycemia: Patient presented with hypoglycemia -Hypoglycemic protocol -Decrease long-acting insulin from 48 units to 38 units  -Continue CBGs q. before meals with sensitive SSI  Peripheral vascular disease: CT arteriogram was negative  for any acute vascular obstruction but did note significant atherosclerotic disease.  Vascular surgery evaluated the patient, but recommended treating with 4' ACE bandages and leg elevation.  -Orders placed for 4" Ace bandage wrap with mild compression -Continue elevation of lower extremity -Continue Plavix and statin   Systolic congestive heart failure: Acute on chronic.  Initial chest x-ray noting mild interstitial edema with right-sided pleural effusion.  Last EF noted to be 45-50% with regional wall abnormalities on 04/17/2018. -Strict ins and outs  -Daily weights  CAD s/p stent: Patient denies any complaints of chest pain. -Continue Plavix and statin   Essential hypertension: Patient currently with low normal blood pressures.  -Continue metoprolol as tolerated  Atrial fibrillation on chronic anticoagulation: Patient appears currently rate controlled. -Continue Eliquis  ESRD on HD: Patient normally dialyzes Tuesday, Thursday, Saturday.  On physical exam patient appears to be fluid overloaded. -Per nephrology  Hyperlipidemia  -Continue atorvastatin  S/p TAVR   Body mass index is 33.15 kg/m.   Family Communication/Anticipated D/C date and plan/Code Status   DVT prophylaxis: Eliquis Code Status: Full Code.  Family Communication: No family present at bedside Disposition Plan: Possible discharge home in 2 to 3 days   Medical Consultants:    Vascular surgery  Nephrology   Anti-Infectives:    Cefepime and metronidazole day 2  Subjective:   Patient feels like his feet are worse today parents.  Objective:    Vitals:   02/21/19 0900 02/21/19 0930 02/21/19 1000 02/21/19 1030  BP: (!) 91/51 (!) 91/51 (!) 109/48 (!) 97/45  Pulse: (!) 57 (!) 55 (!) 59 66  Resp: 15 12 13 16   Temp:  TempSrc:      SpO2:      Weight:        Intake/Output Summary (Last 24 hours) at 02/21/2019 1110 Last data filed at 02/21/2019 0834 Gross per 24 hour  Intake 760 ml  Output  179 ml  Net 581 ml   Filed Weights   02/20/19 1311 02/20/19 1730 02/21/19 0851  Weight: 107 kg 107 kg 107.8 kg    Exam: Constitutional: Obese male in NAD, calm, comfortable Eyes: PERRL, lids and conjunctivae normal ENMT: Mucous membranes are moist. Posterior pharynx clear of any exudate or lesions.  Neck: normal, supple, no masses, no thyromegaly Respiratory: Normal respiratory effort with decreased overall aeration.  Patient on 4 L of nasal cannula.  Positive crackles appreciated in lower lung fields. Cardiovascular: Bradycardic no murmurs / rubs / gallops.  2+ pitting edema to the bilateral lower extremities. 2+ pedal pulses. No carotid bruits.  Abdomen: no tenderness, no masses palpated. No hepatosplenomegaly. Bowel sounds positive.  Musculoskeletal: no clubbing / cyanosis. No joint deformity upper and lower extremities. Good ROM, no contractures. Normal muscle tone.  Skin: dark red discoloration of the left foot no increased warmth noted, Neurologic: CN 2-12 grossly intact. Sensation intact, DTR normal. Strength 5/5 in all 4.  Psychiatric: Normal judgment and insight. Alert and oriented x 3. Normal mood.    Data Reviewed:   I have personally reviewed following labs and imaging studies:  Labs: Labs show the following:   Basic Metabolic Panel: Recent Labs  Lab 02/20/19 0026 02/20/19 0650  NA 135 135  K 4.3 4.7  CL 94* 94*  CO2 26 24  GLUCOSE 115* 167*  BUN 16 20  CREATININE 3.27* 3.59*  CALCIUM 8.6* 8.5*   GFR Estimated Creatinine Clearance: 20.5 mL/min (A) (by C-G formula based on SCr of 3.59 mg/dL (H)). Liver Function Tests: Recent Labs  Lab 02/20/19 0650  AST 24  ALT 17  ALKPHOS 92  BILITOT 1.6*  PROT 6.3*  ALBUMIN 2.9*   No results for input(s): LIPASE, AMYLASE in the last 168 hours. No results for input(s): AMMONIA in the last 168 hours. Coagulation profile Recent Labs  Lab 02/20/19 0701  INR 2.4*    CBC: Recent Labs  Lab 02/20/19 0026  02/20/19 0650  WBC 9.3 9.4  NEUTROABS 6.4 6.7  HGB 10.2* 10.2*  HCT 34.6* 33.7*  MCV 96.1 98.3  PLT 197 201   Cardiac Enzymes: No results for input(s): CKTOTAL, CKMB, CKMBINDEX, TROPONINI in the last 168 hours. BNP (last 3 results) No results for input(s): PROBNP in the last 8760 hours. CBG: Recent Labs  Lab 02/20/19 2256 02/21/19 0633 02/21/19 0701 02/21/19 0829 02/21/19 1010  GLUCAP 115* 56* 52* 117* 125*   D-Dimer: No results for input(s): DDIMER in the last 72 hours. Hgb A1c: No results for input(s): HGBA1C in the last 72 hours. Lipid Profile: No results for input(s): CHOL, HDL, LDLCALC, TRIG, CHOLHDL, LDLDIRECT in the last 72 hours. Thyroid function studies: No results for input(s): TSH, T4TOTAL, T3FREE, THYROIDAB in the last 72 hours.  Invalid input(s): FREET3 Anemia work up: No results for input(s): VITAMINB12, FOLATE, FERRITIN, TIBC, IRON, RETICCTPCT in the last 72 hours. Sepsis Labs: Recent Labs  Lab 02/20/19 0026 02/20/19 0217 02/20/19 0650 02/20/19 0919  WBC 9.3  --  9.4  --   LATICACIDVEN 2.5* 3.3* 2.8* 2.8*    Microbiology Recent Results (from the past 240 hour(s))  MRSA PCR Screening     Status: None   Collection Time:  02/20/19  7:06 PM  Result Value Ref Range Status   MRSA by PCR NEGATIVE NEGATIVE Final    Comment:        The GeneXpert MRSA Assay (FDA approved for NASAL specimens only), is one component of a comprehensive MRSA colonization surveillance program. It is not intended to diagnose MRSA infection nor to guide or monitor treatment for MRSA infections. Performed at [facility], [place] [address], [place], [place] [address]     Procedures and diagnostic studies:  Ct Angio Low Extrem Left W &/or Wo Contrast  Result Date: 02/20/2019 CLINICAL DATA:  Cold left foot, sudden onset, vascular compromise suspected. There is also history of diabetes with left-sided foot discoloration becoming worse over 2 months. EXAM: CT  ANGIOGRAPHY LOWER LEFT EXTREMITY TECHNIQUE: Arterially timed CTA of the left lower extremity was performed after bolus administration of intravenous contrast. CONTRAST:  179mL OMNIPAQUE IOHEXOL 350 MG/ML SOLN COMPARISON:  None similar FINDINGS: Iliacs: Atherosclerotic calcification without dissection or flow limiting stenosis. Common femoral: Atherosclerosis without flow limiting stenosis or dissection. Superficial femoral artery: Heavily calcified main trunk and multiple orders of downstream branches which are not well evaluated due to the degree of calcified plaque blooming relative to the lumen size. There is up to 70% atheromatous stenosis in the distal SFA due to bulky calcified plaque. No acute occlusion is seen. Popliteal: Heavily calcified wall with diffuse mild to moderate narrowing. Leg: The tibioperoneal trunk and proximal peroneal artery is patent. Below the upper calf vessels are heavily calcified such that luminal density is not confidently measurable. There is no definite flow within branches of the foot. Nonvascular: Fatty left inguinal hernia. Enlarged prostate with bladder wall thickening. Small volume pelvic ascites. Diffuse subcutaneous edema in the left lower extremity without soft tissue gas or abscess. No bony erosion or joint destruction. Advanced knee osteoarthritis. Review of the MIP images confirms the above findings. IMPRESSION: Heavily and diffusely calcified arteries which precludes luminal visualization beyond the upper calf. No acute occlusion is seen. There is extensive moderate to advanced atheromatous narrowing of the SFA and popliteal arteries. Electronically Signed   By: Monte Fantasia M.D.   On: 02/20/2019 04:29   Dg Chest Port 1 View  Result Date: 02/20/2019 CLINICAL DATA:  Initial evaluation for elevated lactate. EXAM: PORTABLE CHEST 1 VIEW COMPARISON:  Prior radiograph from 11/29/2018 FINDINGS: Right-sided pacemaker/AICD in place. Advanced cardiomegaly, stable.  Mediastinal silhouette normal. Aortic atherosclerosis. Lungs mildly hypoinflated. Diffuse pulmonary vascular and interstitial congestion, consistent with mild diffuse pulmonary interstitial edema. No consolidative space opacity. No pneumothorax. Probable trace right pleural effusion. Osseous structures within normal limits. IMPRESSION: 1. Cardiomegaly with mild diffuse pulmonary interstitial edema and small right pleural effusion. 2. Aortic atherosclerosis. Electronically Signed   By: Jeannine Boga M.D.   On: 02/20/2019 03:54   Dg Foot Complete Left  Result Date: 02/20/2019 CLINICAL DATA:  Bruising and discoloration for 2 days. EXAM: LEFT FOOT - COMPLETE 3+ VIEW COMPARISON:  Left foot radiograph 01/13/2009 FINDINGS: Hammertoe deformity of the digits which limits assessment. No fracture, dislocation, or bony destructive change. Midfoot osteoarthritis. Probable remote fracture of the third proximal metatarsal, unchanged in appearance from prior exam. Small plantar calcaneal spur. There are dense vascular calcifications. Soft tissue edema noted about the dorsum of the foot. No soft tissue air. IMPRESSION: 1. Dorsal soft tissue edema.  No acute osseous abnormality. 2. Midfoot osteoarthritis. Hammertoe deformity of the digits limits assessment. Electronically Signed   By: Aurther Loft.D.  On: 02/20/2019 00:25    Medications:   . apixaban  2.5 mg Oral BID  . atorvastatin  80 mg Oral q1800  . Chlorhexidine Gluconate Cloth  6 each Topical Q0600  . cinacalcet  30 mg Oral Q supper  . clopidogrel  75 mg Oral Daily  . doxercalciferol      . doxercalciferol  1 mcg Intravenous Q T,Th,Sa-HD  . feeding supplement (PRO-STAT SUGAR FREE 64)  30 mL Oral BID  . guaiFENesin  600 mg Oral BID  . insulin aspart  0-9 Units Subcutaneous TID WC  . insulin detemir  38 Units Subcutaneous QHS  . loratadine  10 mg Oral Daily  . metoprolol tartrate  12.5 mg Oral BID  . midodrine  10 mg Oral Q T,Th,Sa-HD  .  mometasone-formoterol  2 puff Inhalation BID  . olopatadine  1 drop Right Eye BID  . pantoprazole  40 mg Oral Daily  . sevelamer carbonate  4,000 mg Oral TID WC   Continuous Infusions: . sodium chloride    . sodium chloride    . ceFEPime (MAXIPIME) IV    . metronidazole Stopped (02/21/19 0826)     LOS: 0 days   Rondell A Smith  [facility]   *Please refer to [email or web address], password [email or web address] to get updated schedule on who will round on this patient, as hospitalists switch teams weekly. If 7PM-7AM, please contact night-coverage at [email or web address], password [email or web address] for any overnight needs.

## 2019-02-21 NOTE — Care Management Obs Status (Signed)
[facility] NOTIFICATION   Patient Details  Name: Jeffrey [name] MRN: [id number] Date of Birth: 06/20/1941   Medicare Observation Status Notification Given:  Yes    Carles Collet, RN 02/21/2019, 4:06 PM

## 2019-02-21 NOTE — Progress Notes (Addendum)
[facility] Progress Note   Subjective:  Seen in room, in good spirits. Dyspnea improving. VVS consulted for foot discoloration, felt to have adequate perfusion at this time. Denies CP or abd pain. For HD again today.  Objective Vitals:   02/20/19 2259 02/21/19 0537 02/21/19 0743 02/21/19 0834  BP: (!) 97/47 (!) 82/45  (!) 82/40  Pulse: (!) 54 (!) 108  (!) 54  Resp: 18 18  17   Temp: 97.9 F (36.6 C) 97.6 F (36.4 C)  98 F (36.7 C)  TempSrc: Oral Oral  Oral  SpO2: 96% (!) 59% 98% 100%  Weight:       Physical Exam General: Well appearing man, NAD. Wearing nasal oxygen. Heart: RRR; no murmur Lungs: Dull bases; no wheezing Abdomen: soft, non-tender Extremities: 3+ B pitting edema; erythema to B feet, ?bruising overlying L dorsal foot. Dialysis Access:   Additional Objective Labs: Basic Metabolic Panel: Recent Labs  Lab 02/20/19 0026 02/20/19 0650  NA 135 135  K 4.3 4.7  CL 94* 94*  CO2 26 24  GLUCOSE 115* 167*  BUN 16 20  CREATININE 3.27* 3.59*  CALCIUM 8.6* 8.5*   Liver Function Tests: Recent Labs  Lab 02/20/19 0650  AST 24  ALT 17  ALKPHOS 92  BILITOT 1.6*  PROT 6.3*  ALBUMIN 2.9*   CBC: Recent Labs  Lab 02/20/19 0026 02/20/19 0650  WBC 9.3 9.4  NEUTROABS 6.4 6.7  HGB 10.2* 10.2*  HCT 34.6* 33.7*  MCV 96.1 98.3  PLT 197 201   CBG: Recent Labs  Lab 02/20/19 1855 02/20/19 2256 02/21/19 0633 02/21/19 0701 02/21/19 0829  GLUCAP 122* 115* 56* 52* 117*    Studies/Results: Ct Angio Low Extrem Left W &/or Wo Contrast  Result Date: 02/20/2019 CLINICAL DATA:  Cold left foot, sudden onset, vascular compromise suspected. There is also history of diabetes with left-sided foot discoloration becoming worse over 2 months. EXAM: CT ANGIOGRAPHY LOWER LEFT EXTREMITY TECHNIQUE: Arterially timed CTA of the left lower extremity was performed after bolus administration of intravenous contrast. CONTRAST:  171mL OMNIPAQUE IOHEXOL 350 MG/ML SOLN  COMPARISON:  None similar FINDINGS: Iliacs: Atherosclerotic calcification without dissection or flow limiting stenosis. Common femoral: Atherosclerosis without flow limiting stenosis or dissection. Superficial femoral artery: Heavily calcified main trunk and multiple orders of downstream branches which are not well evaluated due to the degree of calcified plaque blooming relative to the lumen size. There is up to 70% atheromatous stenosis in the distal SFA due to bulky calcified plaque. No acute occlusion is seen. Popliteal: Heavily calcified wall with diffuse mild to moderate narrowing. Leg: The tibioperoneal trunk and proximal peroneal artery is patent. Below the upper calf vessels are heavily calcified such that luminal density is not confidently measurable. There is no definite flow within branches of the foot. Nonvascular: Fatty left inguinal hernia. Enlarged prostate with bladder wall thickening. Small volume pelvic ascites. Diffuse subcutaneous edema in the left lower extremity without soft tissue gas or abscess. No bony erosion or joint destruction. Advanced knee osteoarthritis. Review of the MIP images confirms the above findings. IMPRESSION: Heavily and diffusely calcified arteries which precludes luminal visualization beyond the upper calf. No acute occlusion is seen. There is extensive moderate to advanced atheromatous narrowing of the SFA and popliteal arteries. Electronically Signed   By: Monte Fantasia M.D.   On: 02/20/2019 04:29   Dg Chest Port 1 View  Result Date: 02/20/2019 CLINICAL DATA:  Initial evaluation for elevated lactate. EXAM: PORTABLE CHEST 1 VIEW  COMPARISON:  Prior radiograph from 11/29/2018 FINDINGS: Right-sided pacemaker/AICD in place. Advanced cardiomegaly, stable. Mediastinal silhouette normal. Aortic atherosclerosis. Lungs mildly hypoinflated. Diffuse pulmonary vascular and interstitial congestion, consistent with mild diffuse pulmonary interstitial edema. No consolidative  space opacity. No pneumothorax. Probable trace right pleural effusion. Osseous structures within normal limits. IMPRESSION: 1. Cardiomegaly with mild diffuse pulmonary interstitial edema and small right pleural effusion. 2. Aortic atherosclerosis. Electronically Signed   By: Jeannine Boga M.D.   On: 02/20/2019 03:54   Dg Foot Complete Left  Result Date: 02/20/2019 CLINICAL DATA:  Bruising and discoloration for 2 days. EXAM: LEFT FOOT - COMPLETE 3+ VIEW COMPARISON:  Left foot radiograph 01/13/2009 FINDINGS: Hammertoe deformity of the digits which limits assessment. No fracture, dislocation, or bony destructive change. Midfoot osteoarthritis. Probable remote fracture of the third proximal metatarsal, unchanged in appearance from prior exam. Small plantar calcaneal spur. There are dense vascular calcifications. Soft tissue edema noted about the dorsum of the foot. No soft tissue air. IMPRESSION: 1. Dorsal soft tissue edema.  No acute osseous abnormality. 2. Midfoot osteoarthritis. Hammertoe deformity of the digits limits assessment. Electronically Signed   By: Keith Rake M.D.   On: 02/20/2019 00:25   Medications: . ceFEPime (MAXIPIME) IV    . metronidazole Stopped (02/21/19 0826)   . apixaban  2.5 mg Oral BID  . atorvastatin  80 mg Oral q1800  . Chlorhexidine Gluconate Cloth  6 each Topical Q0600  . cinacalcet  30 mg Oral Q supper  . clopidogrel  75 mg Oral Daily  . doxercalciferol  1 mcg Intravenous Q T,Th,Sa-HD  . guaiFENesin  600 mg Oral BID  . insulin aspart  0-9 Units Subcutaneous TID WC  . insulin detemir  38 Units Subcutaneous QHS  . loratadine  10 mg Oral Daily  . metoprolol tartrate  12.5 mg Oral BID  . midodrine  10 mg Oral Q T,Th,Sa-HD  . mometasone-formoterol  2 puff Inhalation BID  . olopatadine  1 drop Right Eye BID  . pantoprazole  40 mg Oral Daily  . sevelamer carbonate  4,000 mg Oral TID WC    Dialysis Orders: TTS [place] 4hr, 200 dialyzer, 400/800, EDW  104kg, 2K/2.25Ca, AVF, UFP #2 - Mircera 225 q 2 weeks (last 3/24) - Venofer 50 IV weekly - Hectoral 55mcg IV q HD  Assessment/Plan: 1. BLE edema:  ? Cellulitis L > R versus venous stasis changes. On empiric Cefepime/Flagyl. S/p extra HD for volume. VVS consulted, no interventional studies planned at this time. 2.  ESRD: S/p extra HD Fri, now back to TTS schedule (HD today) - maximizing UF as tolerated (3.5L goal). 3.  Hypotension/volume: Chronic low BP, on midodrine. UF as tolerated. Likely needs EDW down further than currently assigned. 4.  Anemia: Hgb 10.2, stable. Not due for ESA yet. 5.  Metabolic bone disease: Ca ok, Phos pending. Continue hectorol /sensipar/renvela 6.  Nutrition: Alb low, adding pro-stat supplements. 7.  Hx CAD/TAVR/afib s/p PM, TAVR, stents: Needs volume down. 8.  COPD 9.  Loose stools x 1 week: Follow.  Veneta Penton, PA-C 02/21/2019, 8:53 AM  [facility] Pager: [phone]  I have seen and examined this patient and agree with plan and assessment in the above note with renal recommendations/intervention highlighted.  Will continue to challenge edw due to significant edema. [place]us John A Jeson Camacho,MD 02/21/2019 12:03 PM

## 2019-02-22 ENCOUNTER — Other Ambulatory Visit: Payer: Self-pay

## 2019-02-22 LAB — GLUCOSE, CAPILLARY
Glucose-Capillary: 61 mg/dL — ABNORMAL LOW (ref 70–99)
Glucose-Capillary: 166 mg/dL — ABNORMAL HIGH (ref 70–99)
Glucose-Capillary: 127 mg/dL — ABNORMAL HIGH (ref 70–99)
Glucose-Capillary: 150 mg/dL — ABNORMAL HIGH (ref 70–99)

## 2019-02-22 MED ORDER — MIDODRINE HCL 5 MG PO TABS: 10.0000 mg | ORAL_TABLET | ORAL | Status: DC

## 2019-02-22 MED ORDER — CLINDAMYCIN PHOSPHATE 600 MG/50ML IV SOLN
600.0000 mg | Freq: Three times a day (TID) | INTRAVENOUS | Status: DC
  Administered 2019-02-22: 600 mg via INTRAVENOUS
  Filled 2019-02-22: qty 50

## 2019-02-22 MED ORDER — CLINDAMYCIN HCL 300 MG PO CAPS
300.0000 mg | ORAL_CAPSULE | Freq: Three times a day (TID) | ORAL | Status: DC
  Administered 2019-02-23 – 2019-02-24 (×4): 300 mg via ORAL
  Filled 2019-02-22 (×4): qty 1

## 2019-02-22 MED ORDER — MELATONIN 3 MG PO TABS
9.0000 mg | ORAL_TABLET | Freq: Every evening | ORAL | Status: DC | PRN
  Administered 2019-02-23: 9 mg via ORAL
  Filled 2019-02-22 (×3): qty 3

## 2019-02-22 MED ORDER — CLINDAMYCIN PHOSPHATE 600 MG/50ML IV SOLN
600.0000 mg | Freq: Three times a day (TID) | INTRAVENOUS | Status: AC
  Administered 2019-02-22 – 2019-02-23 (×2): 600 mg via INTRAVENOUS
  Filled 2019-02-22 (×2): qty 50

## 2019-02-22 MED ORDER — INSULIN DETEMIR 100 UNIT/ML ~~LOC~~ SOLN
28.0000 [IU] | Freq: Every day | SUBCUTANEOUS | Status: DC
  Administered 2019-02-22 – 2019-02-23 (×2): 28 [IU] via SUBCUTANEOUS
  Filled 2019-02-22 (×3): qty 0.28

## 2019-02-22 NOTE — Progress Notes (Signed)
   VASCULAR SURGERY ASSESSMENT & PLAN:   CHRONIC VENOUS INSUFFICIENCY: This patient has evidence of chronic venous insufficiency on exam.  I would recommend leg elevation and mild compression therapy.  I have also instructed him to keep his skin well lubricated.  He does not have any evidence of significant arterial insufficiency.  His venous duplex scan showed no evidence of DVT bilaterally.  His normal hemodialysis schedule is Tuesday Thursday Saturday  I have arranged follow-up with me in 3 months.  Vascular surgery will be available as needed.  SUBJECTIVE:   No complaints this morning.  PHYSICAL EXAM:   Vitals:   02/21/19 1700 02/21/19 2039 02/22/19 0533 02/22/19 0802  BP: (!) 86/44 (!) 113/45 120/63   Pulse: (!) 55 (!) 55 (!) 53   Resp: 16 (!) 21 15   Temp: (!) 97.5 F (36.4 C) 97.8 F (36.6 C) (!) 97.5 F (36.4 C)   TempSrc: Oral Oral Oral   SpO2: 100% 97% 96% 98%  Weight:  104 kg     Hyperpigmentation bilaterally consistent with chronic venous insufficiency.  LABS:   VENOUS DUPLEX: I have independently interpreted his venous duplex scan yesterday.  This shows no evidence of deep venous thrombosis bilaterally.  He does have a Baker's cyst on the right.  ARTERIAL DOPPLER STUDY: I have reviewed his arterial Doppler study that was done in January of this year.  The patient had biphasic Doppler signals in the dorsalis pedis and posterior tibial positions bilaterally.  ABIs could not be obtained as the arteries were calcified and noncompressible.  Lab Results  Component Value Date   WBC 9.4 02/20/2019   HGB 10.2 (L) 02/20/2019   HCT 33.7 (L) 02/20/2019   MCV 98.3 02/20/2019   PLT 201 02/20/2019   CBG (last 3)  Recent Labs    02/21/19 1701 02/21/19 2039 02/22/19 0717  GLUCAP 97 127* 61*    PROBLEM LIST:    Principal Problem:   Discoloration of skin of foot Active Problems:   Type 2 diabetes mellitus with chronic kidney disease on chronic dialysis, with  long-term current use of insulin (HCC)   Essential hypertension   ESRD (end stage renal disease) on dialysis (HCC)   Chronic combined systolic and diastolic CHF (congestive heart failure) (HCC)   S/P TAVR (transcatheter aortic valve replacement)   Atrial fibrillation (HCC) [I48.91]   Discoloration of skin of multiple sites of lower extremity   CURRENT MEDS:   . apixaban  2.5 mg Oral BID  . atorvastatin  80 mg Oral q1800  . Chlorhexidine Gluconate Cloth  6 each Topical Q0600  . cinacalcet  30 mg Oral Q supper  . clopidogrel  75 mg Oral Daily  . doxercalciferol  1 mcg Intravenous Q T,Th,Sa-HD  . feeding supplement (PRO-STAT SUGAR FREE 64)  30 mL Oral BID  . guaiFENesin  600 mg Oral BID  . insulin aspart  0-9 Units Subcutaneous TID WC  . insulin detemir  38 Units Subcutaneous QHS  . loratadine  10 mg Oral Daily  . metoprolol tartrate  12.5 mg Oral BID  . midodrine  10 mg Oral Q T,Th,Sa-HD  . mometasone-formoterol  2 puff Inhalation BID  . olopatadine  1 drop Right Eye BID  . pantoprazole  40 mg Oral Daily  . sevelamer carbonate  4,000 mg Oral TID [facility]   [name] Beeper: [phone] Office: [phone] 02/22/2019

## 2019-02-22 NOTE — Discharge Instructions (Signed)

## 2019-02-22 NOTE — Progress Notes (Addendum)
[facility] Progress Note   Subjective:  Seen in room. Says breathing a little better, but still didn't sleep well last night (+orthopnea). No fevers/chills. S/p LE u/s which was negative for DVT. B ankles wrapped loosely with ACE bandages at this time. Lots of blood scattered on his gown and sheets, has been scratching spot on his R upper back - RN notified to help clean him up.  Objective Vitals:   02/21/19 1700 02/21/19 2039 02/22/19 0533 02/22/19 0802  BP: (!) 86/44 (!) 113/45 120/63   Pulse: (!) 55 (!) 55 (!) 53   Resp: 16 (!) 21 15   Temp: (!) 97.5 F (36.4 C) 97.8 F (36.6 C) (!) 97.5 F (36.4 C)   TempSrc: Oral Oral Oral   SpO2: 100% 97% 96% 98%  Weight:  104 kg     Physical Exam General: Well appearing man, NAD. Wearing nasal oxygen. Heart: RRR; no murmur Lungs: Dull bases; no wheezing Abdomen: soft, non-tender Extremities: 3+ B pitting edema; both feet wrapped with ACE today. Dialysis Access: AVF + bruit  Additional Objective Labs: Basic Metabolic Panel: Recent Labs  Lab 02/20/19 0026 02/20/19 0650  NA 135 135  K 4.3 4.7  CL 94* 94*  CO2 26 24  GLUCOSE 115* 167*  BUN 16 20  CREATININE 3.27* 3.59*  CALCIUM 8.6* 8.5*   Liver Function Tests: Recent Labs  Lab 02/20/19 0650  AST 24  ALT 17  ALKPHOS 92  BILITOT 1.6*  PROT 6.3*  ALBUMIN 2.9*   CBC: Recent Labs  Lab 02/20/19 0026 02/20/19 0650  WBC 9.3 9.4  NEUTROABS 6.4 6.7  HGB 10.2* 10.2*  HCT 34.6* 33.7*  MCV 96.1 98.3  PLT 197 201   Blood Culture    Component Value Date/Time   SDES BLOOD RIGHT ANTECUBITAL 02/20/2019 0933   SPECREQUEST  02/20/2019 0933    BOTTLES DRAWN AEROBIC ONLY Blood Culture adequate volume   CULT  02/20/2019 0933    NO GROWTH 1 DAY Performed at [facility], [place] [address], [place], [place] [address]    REPTSTATUS PENDING 02/20/2019 8616   Studies/Results: Vas [place] Lower Extremity Venous (dvt)  Result Date: 02/22/2019  Lower Venous Study  Indications: Edema.  Limitations: Poor ultrasound/tissue interface. Performing Technologist: Antonieta Pert RDMS, RVT  Examination Guidelines: A complete evaluation includes B-mode imaging, spectral Doppler, color Doppler, and power Doppler as needed of all accessible portions of each vessel. Bilateral testing is considered an integral part of a complete examination. Limited examinations for reoccurring indications may be performed as noted.  Right Venous Findings: +---------+---------------+---------+-----------+----------+------------------+          CompressibilityPhasicitySpontaneityPropertiesSummary            +---------+---------------+---------+-----------+----------+------------------+ CFV      Full           Yes      Yes                                     +---------+---------------+---------+-----------+----------+------------------+ SFJ      Full                                                            +---------+---------------+---------+-----------+----------+------------------+ FV Prox  Full                                                            +---------+---------------+---------+-----------+----------+------------------+  FV Mid   Full                                                            +---------+---------------+---------+-----------+----------+------------------+ FV DistalFull                                                            +---------+---------------+---------+-----------+----------+------------------+ PFV      Full                                                            +---------+---------------+---------+-----------+----------+------------------+ POP      Full           Yes      Yes                                     +---------+---------------+---------+-----------+----------+------------------+ PTV                                                   limited                                                                   visualization      +---------+---------------+---------+-----------+----------+------------------+ PERO                                                  limited                                                                  visualization      +---------+---------------+---------+-----------+----------+------------------+ GSV      Full                                                            +---------+---------------+---------+-----------+----------+------------------+  Left Venous Findings: +---------+---------------+---------+-----------+----------+-------+          CompressibilityPhasicitySpontaneityPropertiesSummary +---------+---------------+---------+-----------+----------+-------+ CFV      Full                                                 +---------+---------------+---------+-----------+----------+-------+  SFJ      Full                                                 +---------+---------------+---------+-----------+----------+-------+ FV Prox  Full                                                 +---------+---------------+---------+-----------+----------+-------+ FV Mid   Full                                                 +---------+---------------+---------+-----------+----------+-------+ FV DistalFull                                                 +---------+---------------+---------+-----------+----------+-------+ PFV      Full                                                 +---------+---------------+---------+-----------+----------+-------+ POP      Full                                                 +---------+---------------+---------+-----------+----------+-------+ GSV      Full                                                 +---------+---------------+---------+-----------+----------+-------+ Hypoechoic lesions seen in left groin, ? lymph nodes, largest measuring 3.5 x 2.3 x 1.0cm.  Atherosclerotic changes seen throughout.    Summary: Right: There is no evidence of deep vein thrombosis in the lower extremity. However, portions of this examination were limited- see technologist comments above. A cystic structure is found in the popliteal fossa. Baker's Cyst aprox 2.8x 1.5x 1.6cm Left: There is no evidence of deep vein thrombosis in the lower extremity. However, portions of this examination were limited- see technologist comments above. No cystic structure found in the popliteal fossa. Atherosclerotic changes noted.  *See table(s) above for measurements and observations. Electronically signed by [name] MD on 02/22/2019 at 6:45:11 AM.    Final    Medications: . clindamycin (CLEOCIN) IV 600 mg (02/22/19 0915)   . apixaban  2.5 mg Oral BID  . atorvastatin  80 mg Oral q1800  . Chlorhexidine Gluconate Cloth  6 each Topical Q0600  . cinacalcet  30 mg Oral Q supper  . clopidogrel  75 mg Oral Daily  . doxercalciferol  1 mcg Intravenous Q T,Th,Sa-HD  . feeding supplement (PRO-STAT SUGAR FREE 64)  30 mL Oral BID  . guaiFENesin  600 mg Oral BID  . insulin aspart  0-9 Units Subcutaneous TID WC  .  insulin detemir  38 Units Subcutaneous QHS  . loratadine  10 mg Oral Daily  . metoprolol tartrate  12.5 mg Oral BID  . midodrine  10 mg Oral Q T,Th,Sa-HD  . mometasone-formoterol  2 puff Inhalation BID  . olopatadine  1 drop Right Eye BID  . pantoprazole  40 mg Oral Daily  . sevelamer carbonate  4,000 mg Oral TID WC    Dialysis Orders: TTS [place] 4hr, 200 dialyzer, 400/800, EDW 104kg, 2K/2.25Ca, AVF, UFP #2 - Mircera 225 q 2 weeks (last 3/24) - Venofer 50 IV weekly - Hectoral 19mcg IV q HD  Assessment/Plan: 1. BLE edema:  ? Cellulitis L >R versus venous stasis changes - remains grossly overloaded. On empiric Cefepime/Flagyl. VVS consulted, no interventional studies planned at this time. LE u/s negative for DVT. 2. ESRD: S/p extra HD Fri, then usual Sat HD (TTS). Still  very overloaded - will continue serial HD with next sched for Monday 3/30. 3. Hypotension/volume: Chronic low BP, on midodrine. Got to outpt EDW on Sat but still markedly edematous - suspect his EDW needs to be a good 5kg or more lower (~99kg).  4. Anemia: Hgb 10.2, stable. Not due for ESA yet. 5. Metabolic bone disease: Ca ok, Phos pending.Continue hectorol /sensipar/renvela 6. Nutrition: Alb low, start pro-stat supplements. 7. Hx CAD/TAVR/afib s/p PM, TAVR, stents: Needs volume down. 8. COPD 9. Loose stools x 1 week: Follow. 10.  T2DM: On insulin, per primary. 11.  HFrEF (45-50%)   Veneta Penton, PA-C 02/22/2019, 9:21 AM  [facility] Pager: [phone]  I have seen and examined this patient and agree with plan and assessment in the above note with renal recommendations/intervention highlighted.  Still with edema and plan for another extra session of HD on Monday.  May need Palliative care consult if his hypotension continues to prevent adequate fluid removal with HD.  Broadus John A Jamion Carter,MD 02/22/2019 11:50 AM

## 2019-02-22 NOTE — Progress Notes (Addendum)
Progress Note    Jeffrey [name]  OVF:[id number] DOB: 31-Aug-1941  DOA: 02/19/2019 PCP: Scot Jun, FNP    Brief Narrative:   Chief complaint: Discoloration of the feet  Medical records reviewed and are as summarized below:  Jeffrey [name] is an 78 y.o. male  with pmh of CAD s/p stenting, CHF,s/p TAVR,  s/p PM, ESRD on HD T/Th/Sat, DM type II, anemia; who presented with complaints of discoloration of the left foot.  Assessment/Plan:   Principal Problem:   Discoloration of skin of foot Active Problems:   Type 2 diabetes mellitus with chronic kidney disease on chronic dialysis, with long-term current use of insulin (HCC)   Essential hypertension   ESRD (end stage renal disease) on dialysis (HCC)   Chronic combined systolic and diastolic CHF (congestive heart failure) (HCC)   S/P TAVR (transcatheter aortic valve replacement)   Atrial fibrillation (HCC) [I48.91]   Discoloration of skin of multiple sites of lower extremity  Suspected cellulitis: Patient presents with lower extremity discoloration and erythema.  X-rays revealed subcutaneous dorsal soft tissue edema.  Lactic acid noted to be elevated which there was concern for the possibility of infection with elevated CRP of 10.8.  Patient had been started on empiric antibiotics of cefepime and metronidazole.  Blood cultures have remained negative.  Doppler ultrasound of the lower extremities did not show any signs of a blood clot from but did reveal right Baker's cyst -Discontinue cefepime and metronidazole  -Change antibiotics to clindamycin -Follow-up Doppler ultrasound of the lower extremities -Continue to monitor for spreading of possible infection -Continue elevation of lower extremity  Lactic acidosis: Acute.  Lactic acid levels trended up to 3.3.  Suspect this could be related with possibility of infection versus poor perfusion given CHF.  Last check noted to be 2.8. -Recheck lactic acid level in a.m.  Diabetes  mellitus type 2 with hyperglycemia: Patient continues to have intermittent hypoglycemic episodes.  Home regimen includes 48 units of long-acting insulin at night. -Hypoglycemic protocol -Decrease long-acting insulin from 38 units to 28 units nightly -Continue CBGs q. before meals with sensitive SSI  Peripheral vascular disease: CT arteriogram was negative for any acute vascular obstruction but did note significant atherosclerotic disease.  Vascular surgery evaluated the patient, but recommended treating with 4' ACE bandages and leg elevation.  -Orders placed for 4" Ace bandage wrap with mild compression -Continue elevation of lower extremity -Continue Plavix and statin   Systolic congestive heart failure: Acute on chronic.  Initial chest x-ray noting mild interstitial edema with right-sided pleural effusion.  Last EF noted to be 45-50% with regional wall abnormalities on 04/17/2018. -Strict ins and outs  -Daily weights  CAD s/p stent: Patient denies any complaints of chest pain. -Continue Plavix and statin   Essential hypertension: Patient currently with low normal blood pressures.  -Continue metoprolol as tolerated  Atrial fibrillation on chronic anticoagulation: Patient appears currently rate controlled. -Continue Eliquis  ESRD on HD: Patient normally dialyzes Tuesday, Thursday, Saturday.  On physical exam patient appears to be fluid overloaded.  Patient last dialyzed on 3/28. -Per nephrology next scheduled for 3/30   Anemia of chronic kidney disease: Hemoglobin 10.2 on 3/27 which appears near patient's baseline.  Receiving ESA per nephrology. -Recheck CBC in a.m.  Hyperlipidemia  -Continue atorvastatin  S/p TAVR  Baker's cyst of right leg: Incidental finding on Doppler ultrasound of lower extremity   Body mass index is 31.98 kg/m.   Family Communication/Anticipated D/C date and plan/Code  Status   DVT prophylaxis: Eliquis Code Status: Full Code.  Family Communication:  No family present at bedside Disposition Plan: Possible discharge home in 2 to 3 days   Medical Consultants:    Vascular surgery  Nephrology   Anti-Infectives:    Clindamycin  Subjective:   Patient feels like his feet are worse today parents.  Objective:    Vitals:   02/21/19 2039 02/22/19 0533 02/22/19 0802 02/22/19 0942  BP: (!) 113/45 120/63  (!) 95/59  Pulse: (!) 55 (!) 53  (!) 53  Resp: (!) 21 15  18   Temp: 97.8 F (36.6 C) (!) 97.5 F (36.4 C)  97.6 F (36.4 C)  TempSrc: Oral Oral  Oral  SpO2: 97% 96% 98% 97%  Weight: 104 kg       Intake/Output Summary (Last 24 hours) at 02/22/2019 1118 Last data filed at 02/22/2019 0533 Gross per 24 hour  Intake 680 ml  Output 3500 ml  Net -2820 ml   Filed Weights   02/21/19 0851 02/21/19 1306 02/21/19 2039  Weight: 107.8 kg 104 kg 104 kg    Exam: Constitutional: Obese male in NAD, calm, comfortable Eyes: PERRL, lids and conjunctivae normal ENMT: Mucous membranes are moist. Posterior pharynx clear of any exudate or lesions.  Neck: normal, supple, no masses, no thyromegaly Respiratory: Normal respiratory effort with decreased overall aeration.  Patient on 4 L of nasal cannula.  Positive crackles appreciated in lower lung fields. Cardiovascular: Bradycardic no murmurs / rubs / gallops.  2+ pitting edema to the bilateral lower extremities. 2+ pedal pulses. No carotid bruits.  Abdomen: no tenderness, no masses palpated. No hepatosplenomegaly. Bowel sounds positive.  Musculoskeletal: no clubbing / cyanosis. No joint deformity upper and lower extremities. Good ROM, no contractures. Normal muscle tone.  Skin: dark red discoloration of the left foot with some brighter red erythema noted Neurologic: CN 2-12 grossly intact. Sensation intact, DTR normal. Strength 5/5 in all 4.  Psychiatric: Normal judgment and insight. Alert and oriented x 3. Normal mood.    Data Reviewed:   I have personally reviewed following labs and  imaging studies:  Labs: Labs show the following:   Basic Metabolic Panel: Recent Labs  Lab 02/20/19 0026 02/20/19 0650  NA 135 135  K 4.3 4.7  CL 94* 94*  CO2 26 24  GLUCOSE 115* 167*  BUN 16 20  CREATININE 3.27* 3.59*  CALCIUM 8.6* 8.5*   GFR Estimated Creatinine Clearance: 20.1 mL/min (A) (by C-G formula based on SCr of 3.59 mg/dL (H)). Liver Function Tests: Recent Labs  Lab 02/20/19 0650  AST 24  ALT 17  ALKPHOS 92  BILITOT 1.6*  PROT 6.3*  ALBUMIN 2.9*   No results for input(s): LIPASE, AMYLASE in the last 168 hours. No results for input(s): AMMONIA in the last 168 hours. Coagulation profile Recent Labs  Lab 02/20/19 0701  INR 2.4*    CBC: Recent Labs  Lab 02/20/19 0026 02/20/19 0650  WBC 9.3 9.4  NEUTROABS 6.4 6.7  HGB 10.2* 10.2*  HCT 34.6* 33.7*  MCV 96.1 98.3  PLT 197 201   Cardiac Enzymes: No results for input(s): CKTOTAL, CKMB, CKMBINDEX, TROPONINI in the last 168 hours. BNP (last 3 results) No results for input(s): PROBNP in the last 8760 hours. CBG: Recent Labs  Lab 02/21/19 1010 02/21/19 1349 02/21/19 1701 02/21/19 2039 02/22/19 0717  GLUCAP 125* 91 97 127* 61*   D-Dimer: No results for input(s): DDIMER in the last 72 hours. Hgb A1c: No  results for input(s): HGBA1C in the last 72 hours. Lipid Profile: No results for input(s): CHOL, HDL, LDLCALC, TRIG, CHOLHDL, LDLDIRECT in the last 72 hours. Thyroid function studies: No results for input(s): TSH, T4TOTAL, T3FREE, THYROIDAB in the last 72 hours.  Invalid input(s): FREET3 Anemia work up: No results for input(s): VITAMINB12, FOLATE, FERRITIN, TIBC, IRON, RETICCTPCT in the last 72 hours. Sepsis Labs: Recent Labs  Lab 02/20/19 0026 02/20/19 0217 02/20/19 0650 02/20/19 0919  WBC 9.3  --  9.4  --   LATICACIDVEN 2.5* 3.3* 2.8* 2.8*    Microbiology Recent Results (from the past 240 hour(s))  Culture, blood (routine x 2)     Status: None (Preliminary result)   Collection  Time: 02/20/19  9:20 AM  Result Value Ref Range Status   Specimen Description BLOOD RIGHT ANTECUBITAL  Final   Special Requests   Final    BOTTLES DRAWN AEROBIC ONLY Blood Culture adequate volume   Culture   Final    NO GROWTH 1 DAY Performed at [facility], [address] [place], [place] [address]    Report Status PENDING  Incomplete  Culture, blood (routine x 2)     Status: None (Preliminary result)   Collection Time: 02/20/19  9:33 AM  Result Value Ref Range Status   Specimen Description BLOOD RIGHT ANTECUBITAL  Final   Special Requests   Final    BOTTLES DRAWN AEROBIC ONLY Blood Culture adequate volume   Culture   Final    NO GROWTH 1 DAY Performed at [facility], [place] [address] [place], [place] [address]    Report Status PENDING  Incomplete  MRSA PCR Screening     Status: None   Collection Time: 02/20/19  7:06 PM  Result Value Ref Range Status   MRSA by PCR NEGATIVE NEGATIVE Final    Comment:        The GeneXpert MRSA Assay (FDA approved for NASAL specimens only), is one component of a comprehensive MRSA colonization surveillance program. It is not intended to diagnose MRSA infection nor to guide or monitor treatment for MRSA infections. Performed at [facility], [place] [address], [place], [place] [address]     Procedures and diagnostic studies:  Vas [place] Lower Extremity Venous (dvt)  Result Date: 02/22/2019  Lower Venous Study Indications: Edema.  Limitations: Poor ultrasound/tissue interface. Performing Technologist: Antonieta Pert RDMS, RVT  Examination Guidelines: A complete evaluation includes B-mode imaging, spectral Doppler, color Doppler, and power Doppler as needed of all accessible portions of each vessel. Bilateral testing is considered an integral part of a complete examination. Limited examinations for reoccurring indications may be performed as noted.  Right Venous Findings:  +---------+---------------+---------+-----------+----------+------------------+          CompressibilityPhasicitySpontaneityPropertiesSummary            +---------+---------------+---------+-----------+----------+------------------+ CFV      Full           Yes      Yes                                     +---------+---------------+---------+-----------+----------+------------------+ SFJ      Full                                                            +---------+---------------+---------+-----------+----------+------------------+  FV Prox  Full                                                            +---------+---------------+---------+-----------+----------+------------------+ FV Mid   Full                                                            +---------+---------------+---------+-----------+----------+------------------+ FV DistalFull                                                            +---------+---------------+---------+-----------+----------+------------------+ PFV      Full                                                            +---------+---------------+---------+-----------+----------+------------------+ POP      Full           Yes      Yes                                     +---------+---------------+---------+-----------+----------+------------------+ PTV                                                   limited                                                                  visualization      +---------+---------------+---------+-----------+----------+------------------+ PERO                                                  limited                                                                  visualization      +---------+---------------+---------+-----------+----------+------------------+ GSV      Full                                                             +---------+---------------+---------+-----------+----------+------------------+  Left Venous Findings: +---------+---------------+---------+-----------+----------+-------+          CompressibilityPhasicitySpontaneityPropertiesSummary +---------+---------------+---------+-----------+----------+-------+ CFV      Full                                                 +---------+---------------+---------+-----------+----------+-------+ SFJ      Full                                                 +---------+---------------+---------+-----------+----------+-------+ FV Prox  Full                                                 +---------+---------------+---------+-----------+----------+-------+ FV Mid   Full                                                 +---------+---------------+---------+-----------+----------+-------+ FV DistalFull                                                 +---------+---------------+---------+-----------+----------+-------+ PFV      Full                                                 +---------+---------------+---------+-----------+----------+-------+ POP      Full                                                 +---------+---------------+---------+-----------+----------+-------+ GSV      Full                                                 +---------+---------------+---------+-----------+----------+-------+ Hypoechoic lesions seen in left groin, ? lymph nodes, largest measuring 3.5 x 2.3 x 1.0cm. Atherosclerotic changes seen throughout.    Summary: Right: There is no evidence of deep vein thrombosis in the lower extremity. However, portions of this examination were limited- see technologist comments above. A cystic structure is found in the popliteal fossa. Baker's Cyst aprox 2.8x 1.5x 1.6cm Left: There is no evidence of deep vein thrombosis in the lower extremity. However, portions of this examination were limited- see technologist  comments above. No cystic structure found in the popliteal fossa. Atherosclerotic changes noted.  *See table(s) above for measurements and observations. Electronically signed by [name] MD on 02/22/2019 at 6:45:11 AM.    Final     Medications:   . apixaban  2.5 mg Oral BID  . atorvastatin  80 mg Oral q1800  . Chlorhexidine  Gluconate Cloth  6 each Topical [id number]  . cinacalcet  30 mg Oral Q supper  . clopidogrel  75 mg Oral Daily  . doxercalciferol  1 mcg Intravenous Q T,Th,Sa-HD  . feeding supplement (PRO-STAT SUGAR FREE 64)  30 mL Oral BID  . guaiFENesin  600 mg Oral BID  . insulin aspart  0-9 Units Subcutaneous TID WC  . insulin detemir  38 Units Subcutaneous QHS  . loratadine  10 mg Oral Daily  . metoprolol tartrate  12.5 mg Oral BID  . midodrine  10 mg Oral Q T,Th,Sa-HD  . midodrine  10 mg Oral See admin instructions  . mometasone-formoterol  2 puff Inhalation BID  . olopatadine  1 drop Right Eye BID  . pantoprazole  40 mg Oral Daily  . sevelamer carbonate  4,000 mg Oral TID WC   Continuous Infusions: . clindamycin (CLEOCIN) IV 600 mg (02/22/19 0915)     LOS: 1 day   Jeffrey [name]  [facility]   *Please refer to [email or web address], password [email or web address] to get updated schedule on who will round on this patient, as hospitalists switch teams weekly. If 7PM-7AM, please contact night-coverage at [email or web address], password [email or web address] for any overnight needs.

## 2019-02-23 DIAGNOSIS — I10 Essential (primary) hypertension: Secondary | ICD-10-CM

## 2019-02-23 LAB — RENAL FUNCTION PANEL
Albumin: 3.1 g/dL — ABNORMAL LOW (ref 3.5–5.0)
Anion gap: 18 — ABNORMAL HIGH (ref 5–15)
BUN: 23 mg/dL (ref 8–23)
CO2: 23 mmol/L (ref 22–32)
Calcium: 8.3 mg/dL — ABNORMAL LOW (ref 8.9–10.3)
Chloride: 93 mmol/L — ABNORMAL LOW (ref 98–111)
Creatinine, Ser: 3.78 mg/dL — ABNORMAL HIGH (ref 0.61–1.24)
GFR calc Af Amer: 16 mL/min — ABNORMAL LOW (ref >60)
GFR, EST NON AFRICAN AMERICAN: 14 mL/min — AB (ref >60)
Glucose, Bld: 124 mg/dL — ABNORMAL HIGH (ref 70–99)
Phosphorus: 3.2 mg/dL (ref 2.5–4.6)
Potassium: 3.9 mmol/L (ref 3.5–5.1)
Sodium: 134 mmol/L — ABNORMAL LOW (ref 135–145)

## 2019-02-23 LAB — CBC WITH DIFFERENTIAL/PLATELET
Abs Immature Granulocytes: 0.04 10*3/uL (ref 0.00–0.07)
Basophils Absolute: 0.1 10*3/uL (ref 0.0–0.1)
Basophils Relative: 1 %
Eosinophils Absolute: 0.3 10*3/uL (ref 0.0–0.5)
Eosinophils Relative: 3 %
HCT: 32 % — ABNORMAL LOW (ref 39.0–52.0)
Hemoglobin: 9.9 g/dL — ABNORMAL LOW (ref 13.0–17.0)
Immature Granulocytes: 0 %
Lymphocytes Relative: 16 %
Lymphs Abs: 1.5 10*3/uL (ref 0.7–4.0)
MCH: 29.6 pg (ref 26.0–34.0)
MCHC: 30.9 g/dL (ref 30.0–36.0)
MCV: 95.5 fL (ref 80.0–100.0)
Monocytes Absolute: 1.4 10*3/uL — ABNORMAL HIGH (ref 0.1–1.0)
Monocytes Relative: 15 %
Neutro Abs: 6 10*3/uL (ref 1.7–7.7)
Neutrophils Relative %: 65 %
Platelets: 186 10*3/uL (ref 150–400)
RBC: 3.35 MIL/uL — ABNORMAL LOW (ref 4.22–5.81)
RDW: 17.1 % — AB (ref 11.5–15.5)
WBC: 9.2 10*3/uL (ref 4.0–10.5)
nRBC: 0.9 % — ABNORMAL HIGH (ref 0.0–0.2)

## 2019-02-23 LAB — CBC
HCT: 32.2 % — ABNORMAL LOW (ref 39.0–52.0)
Hemoglobin: 9.7 g/dL — ABNORMAL LOW (ref 13.0–17.0)
MCH: 28.4 pg (ref 26.0–34.0)
MCHC: 30.1 g/dL (ref 30.0–36.0)
MCV: 94.4 fL (ref 80.0–100.0)
PLATELETS: 196 10*3/uL (ref 150–400)
RBC: 3.41 MIL/uL — ABNORMAL LOW (ref 4.22–5.81)
RDW: 17.1 % — ABNORMAL HIGH (ref 11.5–15.5)
WBC: 9.9 10*3/uL (ref 4.0–10.5)
nRBC: 0.7 % — ABNORMAL HIGH (ref 0.0–0.2)

## 2019-02-23 LAB — GLUCOSE, CAPILLARY
Glucose-Capillary: 125 mg/dL — ABNORMAL HIGH (ref 70–99)
Glucose-Capillary: 84 mg/dL (ref 70–99)
Glucose-Capillary: 130 mg/dL — ABNORMAL HIGH (ref 70–99)
Glucose-Capillary: 108 mg/dL — ABNORMAL HIGH (ref 70–99)

## 2019-02-23 LAB — LACTIC ACID, PLASMA: Lactic Acid, Venous: 4.4 mmol/L (ref 0.5–1.9)

## 2019-02-23 MED ORDER — CHLORHEXIDINE GLUCONATE CLOTH 2 % EX PADS: 6.0000 | MEDICATED_PAD | Freq: Every day | CUTANEOUS | Status: DC

## 2019-02-23 MED ORDER — CINACALCET HCL 30 MG PO TABS
30.0000 mg | ORAL_TABLET | ORAL | Status: DC
  Administered 2019-02-24: 30 mg via ORAL
  Filled 2019-02-23: qty 1

## 2019-02-23 MED ORDER — MIDODRINE HCL 5 MG PO TABS
10.0000 mg | ORAL_TABLET | Freq: Once | ORAL | Status: AC
  Administered 2019-02-23: 10 mg via ORAL

## 2019-02-23 MED ORDER — HYDROXYZINE HCL 25 MG PO TABS
12.5000 mg | ORAL_TABLET | Freq: Two times a day (BID) | ORAL | Status: DC | PRN
  Administered 2019-02-24: 25 mg via ORAL
  Filled 2019-02-23: qty 1

## 2019-02-23 MED ORDER — SEVELAMER CARBONATE 800 MG PO TABS
2400.0000 mg | ORAL_TABLET | Freq: Three times a day (TID) | ORAL | Status: DC
  Administered 2019-02-23 – 2019-02-24 (×2): 2400 mg via ORAL
  Filled 2019-02-23 (×4): qty 3

## 2019-02-23 MED ORDER — MIDODRINE HCL 5 MG PO TABS
ORAL_TABLET | ORAL | Status: AC
  Administered 2019-02-23: 10 mg via ORAL
  Filled 2019-02-23: qty 2

## 2019-02-23 NOTE — Procedures (Signed)
   I was present at this dialysis session, have reviewed the session itself and made  appropriate changes Kelly Splinter MD [place] pager [phone]   02/23/2019, 8:44 AM

## 2019-02-23 NOTE — Progress Notes (Signed)
Progress Note    Jeffrey [name]  YTK:[id number] DOB: 1941-07-27  DOA: 02/19/2019 PCP: Scot Jun, FNP    Brief Narrative:   Chief complaint: Discoloration of the feet  Medical records reviewed and are as summarized below:  Jeffrey [name] is an 78 y.o. male  with pmh of CAD s/p stenting, CHF,s/p TAVR,  s/p PM, ESRD on HD T/Th/Sat, DM type II, and anemia; who presented with complaints of discoloration of his left feet concerning for cellulitis.  Assessment/Plan:   Principal Problem:   Discoloration of skin of foot Active Problems:   Type 2 diabetes mellitus with chronic kidney disease on chronic dialysis, with long-term current use of insulin (HCC)   Essential hypertension   ESRD (end stage renal disease) on dialysis (HCC)   Chronic combined systolic and diastolic CHF (congestive heart failure) (HCC)   S/P TAVR (transcatheter aortic valve replacement)   Atrial fibrillation (HCC) [I48.91]   Discoloration of skin of multiple sites of lower extremity  Suspected cellulitis: Patient presents with lower extremity discoloration and erythema.  X-rays revealed subcutaneous dorsal soft tissue edema.  Lactic acid noted to be elevated which there was concern for the possibility of infection with elevated CRP of 10.8 checked on 3/29.  Patient had been started on empiric antibiotics of cefepime and metronidazole.  Blood cultures have remained negative.  Doppler ultrasound of the lower extremities did not show any signs of a blood clot from.  Some improvement in erythema after switching antibiotics on 3/29, from cefepime and metronidazole to clindamycin IV. -Continue clindamycin p.o. -Follow-up Doppler ultrasound of the lower extremities -Continue elevation of lower extremity -Wound care for left toe wound  Lactic acidosis: Acute.  Lactic acid levels trended up as high as 4.4.  Suspect this could be related with possibility of infection versus poor perfusion given patient's fluid  overload status.  Last check noted to be 2.8. -Recheck lactic acid level in a.m.  ESRD on HD: Patient normally dialyzes Tuesday, Thursday, Saturday.  On physical exam patient appears to be fluid overloaded.  Patient last dialyzed on 3/30, but continues to appear overloaded. -Per nephrology next scheduled for 3/31 -Continue renal diet/carb modified with fluid restriction  Systolic congestive heart failure: Acute on chronic.  Admission chest x-ray noting mild interstitial edema with right-sided pleural effusion.  Last EF noted to be 45-50% with regional wall abnormalities on 04/17/2018.  On physical exam patient still with 2+ pitting edema. -Strict ins and outs  -Daily weights  Peripheral vascular disease, venous stasis: CT arteriogram was negative for any acute vascular obstruction but did note significant atherosclerotic disease.  Vascular surgery evaluated the patient, but recommended treating with 4' ACE bandages and leg elevation.  -Continue 4" Ace bandage wrap with mild compression -Continue elevation of lower extremity -Continue Plavix and statin   Diabetes mellitus type 2 with hypoglycemia: No recurrence of hypoglycemia overnight.  Home regimen includes 48 units of long-acting insulin at night. -Hypoglycemic protocol -Continue decreased long-acting insulin of 28 units nightly -Continue CBGs q. before meals with sensitive SSI  CAD s/p stent: Patient denies any complaints of chest pain. -Continue Plavix and statin   Hypotension: Patient continued to have intermittently low blood pressures as low as 87/46 with heart rates in the 54-58.  Suspect that this is adding to hypoperfusion. -Discontinued metoprolol    Atrial fibrillation on chronic anticoagulation: Patient appears currently rate controlled. -Continue Eliquis -Will reevaluate for need of restarting metoprolol  Anemia of chronic kidney disease:  Hemoglobin 10.2 on 3/27 which appears near patient's baseline.  Receiving ESA per  nephrology.  Hemoglobin 9.9 on 3/30. -Continue to monitor as needed  Hyperlipidemia  -Continue atorvastatin  S/p TAVR  Baker's cyst of right leg: Incidental finding on Doppler ultrasound of lower extremity   Body mass index is 32.59 kg/m.   Family Communication/Anticipated D/C date and plan/Code Status   DVT prophylaxis: Eliquis Code Status: Full Code.  Family Communication: No family present at bedside Disposition Plan: Possible discharge home in 2 to 3 days   Medical Consultants:    Vascular surgery  Nephrology   Anti-Infectives:    Clindamycin day 2  Subjective:   Patient feels like his feet feet are slightly improved.  However, patient reports that he is not been able to get up or move.  Objective:    Vitals:   02/23/19 0730 02/23/19 0800 02/23/19 0830 02/23/19 0900  BP: (!) 93/46 (!) 103/55 (!) 120/56 (!) 97/46  Pulse: (!) 54 (!) 55 (!) 56 (!) 58  Resp: 17 (!) 21 (!) 21 18  Temp:      TempSrc:      SpO2:      Weight:      Height:        Intake/Output Summary (Last 24 hours) at 02/23/2019 1021 Last data filed at 02/23/2019 0425 Gross per 24 hour  Intake 600 ml  Output 0 ml  Net 600 ml   Filed Weights   02/22/19 1753 02/22/19 2126 02/23/19 0715  Weight: 104 kg 104 kg 106 kg    Exam: Constitutional: Obese male in NAD, calm, comfortable Eyes: PERRL, lids and conjunctivae normal ENMT: Mucous membranes are moist. Posterior pharynx clear of any exudate or lesions.  Neck: normal, supple, no masses, no thyromegaly Respiratory: Normal respiratory effort with decreased overall aeration.  Patient on 4 L of nasal cannula.  Positive crackles appreciated in lower lung fields. Cardiovascular: Bradycardic no murmurs / rubs / gallops.  2+ pitting edema to the bilateral lower extremities. 2+ pedal pulses. No carotid bruits.  Abdomen: no tenderness, no masses palpated. No hepatosplenomegaly. Bowel sounds positive.  Musculoskeletal: no clubbing / cyanosis. No  joint deformity upper and lower extremities. Good ROM, no contractures. Normal muscle tone.  Skin: dark red discoloration of the left foot suggestive of venous stasis with previously seen erythema resolving.  Second toe with wound present with serosanguineous fluid present. Neurologic: CN 2-12 grossly intact. Sensation intact, DTR normal. Strength 5/5 in all 4.  Psychiatric: Normal judgment and insight. Alert and oriented x 3. Normal mood.    Data Reviewed:   I have personally reviewed following labs and imaging studies:  Labs: Labs show the following:   Basic Metabolic Panel: Recent Labs  Lab 02/20/19 0026 02/20/19 0650 02/23/19 0602  NA 135 135 134*  K 4.3 4.7 3.9  CL 94* 94* 93*  CO2 26 24 23   GLUCOSE 115* 167* 124*  BUN 16 20 23   CREATININE 3.27* 3.59* 3.78*  CALCIUM 8.6* 8.5* 8.3*  PHOS  --   --  3.2   GFR Estimated Creatinine Clearance: 19.3 mL/min (A) (by C-G formula based on SCr of 3.78 mg/dL (H)). Liver Function Tests: Recent Labs  Lab 02/20/19 0650 02/23/19 0602  AST 24  --   ALT 17  --   ALKPHOS 92  --   BILITOT 1.6*  --   PROT 6.3*  --   ALBUMIN 2.9* 3.1*   No results for input(s): LIPASE, AMYLASE in the last  168 hours. No results for input(s): AMMONIA in the last 168 hours. Coagulation profile Recent Labs  Lab 02/20/19 0701  INR 2.4*    CBC: Recent Labs  Lab 02/20/19 0026 02/20/19 0650 02/23/19 0602 02/23/19 0746  WBC 9.3 9.4 9.2 9.9  NEUTROABS 6.4 6.7 6.0  --   HGB 10.2* 10.2* 9.9* 9.7*  HCT 34.6* 33.7* 32.0* 32.2*  MCV 96.1 98.3 95.5 94.4  PLT 197 201 186 196   Cardiac Enzymes: No results for input(s): CKTOTAL, CKMB, CKMBINDEX, TROPONINI in the last 168 hours. BNP (last 3 results) No results for input(s): PROBNP in the last 8760 hours. CBG: Recent Labs  Lab 02/22/19 0717 02/22/19 1138 02/22/19 1605 02/22/19 2127 02/23/19 0652  GLUCAP 61* 166* 127* 150* 125*   D-Dimer: No results for input(s): DDIMER in the last 72 hours.  Hgb A1c: No results for input(s): HGBA1C in the last 72 hours. Lipid Profile: No results for input(s): CHOL, HDL, LDLCALC, TRIG, CHOLHDL, LDLDIRECT in the last 72 hours. Thyroid function studies: No results for input(s): TSH, T4TOTAL, T3FREE, THYROIDAB in the last 72 hours.  Invalid input(s): FREET3 Anemia work up: No results for input(s): VITAMINB12, FOLATE, FERRITIN, TIBC, IRON, RETICCTPCT in the last 72 hours. Sepsis Labs: Recent Labs  Lab 02/20/19 0026 02/20/19 0217 02/20/19 0650 02/20/19 0919 02/23/19 0602 02/23/19 0746  WBC 9.3  --  9.4  --  9.2 9.9  LATICACIDVEN 2.5* 3.3* 2.8* 2.8* 4.4*  --     Microbiology Recent Results (from the past 240 hour(s))  Culture, blood (routine x 2)     Status: None (Preliminary result)   Collection Time: 02/20/19  9:20 AM  Result Value Ref Range Status   Specimen Description BLOOD RIGHT ANTECUBITAL  Final   Special Requests   Final    BOTTLES DRAWN AEROBIC ONLY Blood Culture adequate volume   Culture   Final    NO GROWTH 2 DAYS Performed at [facility], [address], [place], [place] [address]    Report Status PENDING  Incomplete  Culture, blood (routine x 2)     Status: None (Preliminary result)   Collection Time: 02/20/19  9:33 AM  Result Value Ref Range Status   Specimen Description BLOOD RIGHT ANTECUBITAL  Final   Special Requests   Final    BOTTLES DRAWN AEROBIC ONLY Blood Culture adequate volume   Culture   Final    NO GROWTH 2 DAYS Performed at [facility], [place] [address] [place], [place] [address]    Report Status PENDING  Incomplete  MRSA PCR Screening     Status: None   Collection Time: 02/20/19  7:06 PM  Result Value Ref Range Status   MRSA by PCR NEGATIVE NEGATIVE Final    Comment:        The GeneXpert MRSA Assay (FDA approved for NASAL specimens only), is one component of a comprehensive MRSA colonization surveillance program. It is not intended to diagnose MRSA infection nor to guide or  monitor treatment for MRSA infections. Performed at [facility], [place] [address], [place], [place] [address]     Procedures and diagnostic studies:  Vas [place] Lower Extremity Venous (dvt)  Result Date: 02/22/2019  Lower Venous Study Indications: Edema.  Limitations: Poor ultrasound/tissue interface. Performing Technologist: Antonieta Pert RDMS, RVT  Examination Guidelines: A complete evaluation includes B-mode imaging, spectral Doppler, color Doppler, and power Doppler as needed of all accessible portions of each vessel. Bilateral testing is considered an  integral part of a complete examination. Limited examinations for reoccurring indications may be performed as noted.  Right Venous Findings: +---------+---------------+---------+-----------+----------+------------------+          CompressibilityPhasicitySpontaneityPropertiesSummary            +---------+---------------+---------+-----------+----------+------------------+ CFV      Full           Yes      Yes                                     +---------+---------------+---------+-----------+----------+------------------+ SFJ      Full                                                            +---------+---------------+---------+-----------+----------+------------------+ FV Prox  Full                                                            +---------+---------------+---------+-----------+----------+------------------+ FV Mid   Full                                                            +---------+---------------+---------+-----------+----------+------------------+ FV DistalFull                                                            +---------+---------------+---------+-----------+----------+------------------+ PFV      Full                                                            +---------+---------------+---------+-----------+----------+------------------+ POP      Full           Yes       Yes                                     +---------+---------------+---------+-----------+----------+------------------+ PTV                                                   limited  visualization      +---------+---------------+---------+-----------+----------+------------------+ PERO                                                  limited                                                                  visualization      +---------+---------------+---------+-----------+----------+------------------+ GSV      Full                                                            +---------+---------------+---------+-----------+----------+------------------+  Left Venous Findings: +---------+---------------+---------+-----------+----------+-------+          CompressibilityPhasicitySpontaneityPropertiesSummary +---------+---------------+---------+-----------+----------+-------+ CFV      Full                                                 +---------+---------------+---------+-----------+----------+-------+ SFJ      Full                                                 +---------+---------------+---------+-----------+----------+-------+ FV Prox  Full                                                 +---------+---------------+---------+-----------+----------+-------+ FV Mid   Full                                                 +---------+---------------+---------+-----------+----------+-------+ FV DistalFull                                                 +---------+---------------+---------+-----------+----------+-------+ PFV      Full                                                 +---------+---------------+---------+-----------+----------+-------+ POP      Full                                                  +---------+---------------+---------+-----------+----------+-------+ GSV  Full                                                 +---------+---------------+---------+-----------+----------+-------+ Hypoechoic lesions seen in left groin, ? lymph nodes, largest measuring 3.5 x 2.3 x 1.0cm. Atherosclerotic changes seen throughout.    Summary: Right: There is no evidence of deep vein thrombosis in the lower extremity. However, portions of this examination were limited- see technologist comments above. A cystic structure is found in the popliteal fossa. Baker's Cyst aprox 2.8x 1.5x 1.6cm Left: There is no evidence of deep vein thrombosis in the lower extremity. However, portions of this examination were limited- see technologist comments above. No cystic structure found in the popliteal fossa. Atherosclerotic changes noted.  *See table(s) above for measurements and observations. Electronically signed by [name] MD on 02/22/2019 at 6:45:11 AM.    Final     Medications:   . apixaban  2.5 mg Oral BID  . atorvastatin  80 mg Oral q1800  . Chlorhexidine Gluconate Cloth  6 each Topical Q0600  . [START ON 02/24/2019] cinacalcet  30 mg Oral Q T,Th,Sat-1800  . clindamycin  300 mg Oral Q8H  . clopidogrel  75 mg Oral Daily  . doxercalciferol  1 mcg Intravenous Q T,Th,Sa-HD  . feeding supplement (PRO-STAT SUGAR FREE 64)  30 mL Oral BID  . guaiFENesin  600 mg Oral BID  . insulin aspart  0-9 Units Subcutaneous TID WC  . insulin detemir  28 Units Subcutaneous QHS  . loratadine  10 mg Oral Daily  . midodrine  10 mg Oral Q T,Th,Sa-HD  . mometasone-formoterol  2 puff Inhalation BID  . olopatadine  1 drop Right Eye BID  . pantoprazole  40 mg Oral Daily  . sevelamer carbonate  2,400 mg Oral TID WC   Continuous Infusions:none    LOS: 2 days   Keatin Benham A Ronia Hazelett  [facility]   *Please refer to [email or web address], password [email or web address] to get updated schedule on who will round on this patient, as  hospitalists switch teams weekly. If 7PM-7AM, please contact night-coverage at [email or web address], password [email or web address] for any overnight needs.

## 2019-02-23 NOTE — Progress Notes (Addendum)
[facility] Progress Note   Dialysis Orders: TTS [place] 4hr, 200 dialyzer, 400/800, EDW 104kg, 2K/2.25Ca, AVF, UFP #2 - Mircera 225 q 2 weeks (last 3/24) - Venofer 50 IV weekly - Hectoral 57mcg IV q HD  Assessment/Plan: 1. BLE edema:? Cellulitis L >R versusvenous stasis changes - remains grossly overloaded. On empiric Cefepime/Flagyl. VVS consulted, no interventional studies planned at this time. LE u/s negative for DVT.- less erthyma in  LE - needs to elevate/compress 2. ESRD: TTS. Extra tmt today. Still very overloaded - will continue serial HD K 3.9 4 K bath today 3. Hypotension/volume: Chronic low BP, on midodrine. Got to outpt EDW on Sat but still markedly edematous -HD again Tuesday if not d/c. EDW needs to be a good 5kg or more lower (~99kg). BP Stable on HD today - goal 4 L - if d/c - try serial HD at outpatient unit to get volume down. 4. Anemia: Hgb 9.7 stable. Not due for ESA yet. 5. Metabolic bone disease: Ca ok, Phos ok .Continue hectorol /sensipar/renvela 6. Nutrition: Alb low, start pro-stat supplements. 7. Hx CAD/TAVR/afib s/p PM, TAVR, stents: Needs volume down. 8. COPD 9. Loose stools x 1 week: Follow. 10.  T2DM: On insulin, per primary. 11.  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  Anxiety - defer to primary  13.  upper right back dressing bloody drainage - he said it happened from scratching - no heparin HD but is on [place], PA-C [place] [phone] 02/23/2019,8:41 AM  LOS: 2 days   Pt seen, examined and agree w A/P as above.  [facility] 02/23/2019, 5:15 PM    Subjective:   Seen on dialysis. No SOB at rest. Wants something for anxiety - said he had something before but can't find anything mentioned in outpatient HD records or medication list. Feels SOB, chest pounding, legs restless, couldn't sleep.  Objective Vitals:   02/22/19 2126 02/23/19 0715 02/23/19 0723 02/23/19 0730  BP:  (!) 94/47 (!) 87/46 (!) 96/48 (!) 93/46  Pulse: (!) 55 (!) 54 (!) 55 (!) 54  Resp: 20 14 17 17   Temp: (!) 97.5 F (36.4 C)     TempSrc: Oral Oral    SpO2:      Weight: 104 kg 106 kg    Height:       Physical Exam General: NAD on HD Heart: RRR Lungs: clear anteriorly Abdomen: soft NT Extremities: 2-3 + edema erythema to feet - legs wrapped with ACE  Dialysis Access: left AVF   Additional Objective Labs: Basic Metabolic Panel: Recent Labs  Lab 02/20/19 0026 02/20/19 0650 02/23/19 0602  NA 135 135 134*  K 4.3 4.7 3.9  CL 94* 94* 93*  CO2 26 24 23   GLUCOSE 115* 167* 124*  BUN 16 20 23   CREATININE 3.27* 3.59* 3.78*  CALCIUM 8.6* 8.5* 8.3*  PHOS  --   --  3.2   Liver Function Tests: Recent Labs  Lab 02/20/19 0650 02/23/19 0602  AST 24  --   ALT 17  --   ALKPHOS 92  --   BILITOT 1.6*  --   PROT 6.3*  --   ALBUMIN 2.9* 3.1*   No results for input(s): LIPASE, AMYLASE in the last 168 hours. CBC: Recent Labs  Lab 02/20/19 0026 02/20/19 0650 02/23/19 0602 02/23/19 0746  WBC 9.3 9.4 9.2 9.9  NEUTROABS 6.4 6.7 6.0  --   HGB 10.2* 10.2* 9.9* 9.7*  HCT 34.6* 33.7* 32.0*  32.2*  MCV 96.1 98.3 95.5 94.4  PLT 197 201 186 196   Blood Culture    Component Value Date/Time   SDES BLOOD RIGHT ANTECUBITAL 02/20/2019 0933   SPECREQUEST  02/20/2019 0933    BOTTLES DRAWN AEROBIC ONLY Blood Culture adequate volume   CULT  02/20/2019 0933    NO GROWTH 2 DAYS Performed at [facility], [place] [place], [place] [address]    REPTSTATUS PENDING 02/20/2019 8841    Cardiac Enzymes: No results for input(s): CKTOTAL, CKMB, CKMBINDEX, TROPONINI in the last 168 hours. CBG: Recent Labs  Lab 02/22/19 0717 02/22/19 1138 02/22/19 1605 02/22/19 2127 02/23/19 0652  GLUCAP 61* 166* 127* 150* 125*   Iron Studies: No results for input(s): IRON, TIBC, TRANSFERRIN, FERRITIN in the last 72 hours. Lab Results  Component Value Date   INR 2.4 (H) 02/20/2019   INR  2.5 12/19/2017   INR 2.2 11/21/2017   Studies/Results: Vas [place] Lower Extremity Venous (dvt)  Result Date: 02/22/2019  Lower Venous Study Indications: Edema.  Limitations: Poor ultrasound/tissue interface. Performing Technologist: Antonieta Pert RDMS, RVT  Examination Guidelines: A complete evaluation includes B-mode imaging, spectral Doppler, color Doppler, and power Doppler as needed of all accessible portions of each vessel. Bilateral testing is considered an integral part of a complete examination. Limited examinations for reoccurring indications may be performed as noted.  Right Venous Findings: +---------+---------------+---------+-----------+----------+------------------+          CompressibilityPhasicitySpontaneityPropertiesSummary            +---------+---------------+---------+-----------+----------+------------------+ CFV      Full           Yes      Yes                                     +---------+---------------+---------+-----------+----------+------------------+ SFJ      Full                                                            +---------+---------------+---------+-----------+----------+------------------+ FV Prox  Full                                                            +---------+---------------+---------+-----------+----------+------------------+ FV Mid   Full                                                            +---------+---------------+---------+-----------+----------+------------------+ FV DistalFull                                                            +---------+---------------+---------+-----------+----------+------------------+ PFV      Full                                                            +---------+---------------+---------+-----------+----------+------------------+  POP      Full           Yes      Yes                                      +---------+---------------+---------+-----------+----------+------------------+ PTV                                                   limited                                                                  visualization      +---------+---------------+---------+-----------+----------+------------------+ PERO                                                  limited                                                                  visualization      +---------+---------------+---------+-----------+----------+------------------+ GSV      Full                                                            +---------+---------------+---------+-----------+----------+------------------+  Left Venous Findings: +---------+---------------+---------+-----------+----------+-------+          CompressibilityPhasicitySpontaneityPropertiesSummary +---------+---------------+---------+-----------+----------+-------+ CFV      Full                                                 +---------+---------------+---------+-----------+----------+-------+ SFJ      Full                                                 +---------+---------------+---------+-----------+----------+-------+ FV Prox  Full                                                 +---------+---------------+---------+-----------+----------+-------+ FV Mid   Full                                                 +---------+---------------+---------+-----------+----------+-------+  FV DistalFull                                                 +---------+---------------+---------+-----------+----------+-------+ PFV      Full                                                 +---------+---------------+---------+-----------+----------+-------+ POP      Full                                                 +---------+---------------+---------+-----------+----------+-------+ GSV      Full                                                  +---------+---------------+---------+-----------+----------+-------+ Hypoechoic lesions seen in left groin, ? lymph nodes, largest measuring 3.5 x 2.3 x 1.0cm. Atherosclerotic changes seen throughout.    Summary: Right: There is no evidence of deep vein thrombosis in the lower extremity. However, portions of this examination were limited- see technologist comments above. A cystic structure is found in the popliteal fossa. Baker's Cyst aprox 2.8x 1.5x 1.6cm Left: There is no evidence of deep vein thrombosis in the lower extremity. However, portions of this examination were limited- see technologist comments above. No cystic structure found in the popliteal fossa. Atherosclerotic changes noted.  *See table(s) above for measurements and observations. Electronically signed by [name] MD on 02/22/2019 at 6:45:11 AM.    Final    Medications:  . apixaban  2.5 mg Oral BID  . atorvastatin  80 mg Oral q1800  . Chlorhexidine Gluconate Cloth  6 each Topical Q0600  . cinacalcet  30 mg Oral Q supper  . clindamycin  300 mg Oral Q8H  . clopidogrel  75 mg Oral Daily  . doxercalciferol  1 mcg Intravenous Q T,Th,Sa-HD  . feeding supplement (PRO-STAT SUGAR FREE 64)  30 mL Oral BID  . guaiFENesin  600 mg Oral BID  . insulin aspart  0-9 Units Subcutaneous TID WC  . insulin detemir  28 Units Subcutaneous QHS  . loratadine  10 mg Oral Daily  . metoprolol tartrate  12.5 mg Oral BID  . midodrine  10 mg Oral Q T,Th,Sa-HD  . mometasone-formoterol  2 puff Inhalation BID  . olopatadine  1 drop Right Eye BID  . pantoprazole  40 mg Oral Daily  . sevelamer carbonate  4,000 mg Oral TID WC

## 2019-02-23 NOTE — Progress Notes (Signed)
PT Cancellation Note  Patient Details Name: Jeffrey [name] MRN: [id number] DOB: 04-24-1941   Cancelled Treatment:    Reason Eval/Treat Not Completed: Other (comment) Pt refusing to participate, as he just got back from HD and he states "I've had a rough morning." Will follow up as schedule allows.   Leighton Ruff, PT, DPT  Acute Rehabilitation Services  Pager: [phone] Office: [phone]  Rudean Hitt 02/23/2019, 1:01 PM

## 2019-02-24 DIAGNOSIS — Z7901 Long term (current) use of anticoagulants: Secondary | ICD-10-CM

## 2019-02-24 DIAGNOSIS — D638 Anemia in other chronic diseases classified elsewhere: Secondary | ICD-10-CM

## 2019-02-24 DIAGNOSIS — L03119 Cellulitis of unspecified part of limb: Secondary | ICD-10-CM

## 2019-02-24 DIAGNOSIS — I878 Other specified disorders of veins: Secondary | ICD-10-CM | POA: Diagnosis present

## 2019-02-24 DIAGNOSIS — I4891 Unspecified atrial fibrillation: Secondary | ICD-10-CM

## 2019-02-24 DIAGNOSIS — I959 Hypotension, unspecified: Secondary | ICD-10-CM

## 2019-02-24 DIAGNOSIS — E11628 Type 2 diabetes mellitus with other skin complications: Secondary | ICD-10-CM | POA: Diagnosis present

## 2019-02-24 LAB — LACTIC ACID, PLASMA: Lactic Acid, Venous: 3.8 mmol/L (ref 0.5–1.9)

## 2019-02-24 LAB — GLUCOSE, CAPILLARY
Glucose-Capillary: 154 mg/dL — ABNORMAL HIGH (ref 70–99)
Glucose-Capillary: 82 mg/dL (ref 70–99)
Glucose-Capillary: 93 mg/dL (ref 70–99)

## 2019-02-24 MED ORDER — MIDODRINE HCL 5 MG PO TABS
ORAL_TABLET | ORAL | Status: AC
  Administered 2019-02-24: 10 mg via ORAL
  Filled 2019-02-24: qty 2

## 2019-02-24 MED ORDER — MIDODRINE HCL 5 MG PO TABS: 2.5000 mg | ORAL_TABLET | Freq: Three times a day (TID) | ORAL | Status: DC

## 2019-02-24 MED ORDER — ALBUMIN HUMAN 25 % IV SOLN
INTRAVENOUS | Status: AC
  Administered 2019-02-24: 25 g
  Filled 2019-02-24: qty 100

## 2019-02-24 MED ORDER — HYDROXYZINE HCL 25 MG PO TABS: 12.5000 mg | ORAL_TABLET | Freq: Two times a day (BID) | ORAL | Status: DC | PRN

## 2019-02-24 MED ORDER — MIDODRINE HCL 5 MG PO TABS
5.0000 mg | ORAL_TABLET | Freq: Three times a day (TID) | ORAL | Status: DC
  Administered 2019-02-24: 5 mg via ORAL
  Filled 2019-02-24: qty 1

## 2019-02-24 MED ORDER — LORAZEPAM 2 MG/ML IJ SOLN
0.5000 mg | Freq: Two times a day (BID) | INTRAMUSCULAR | Status: DC | PRN
  Administered 2019-02-24: 0.5 mg via INTRAVENOUS
  Filled 2019-02-24: qty 1

## 2019-02-24 MED ORDER — MIDODRINE HCL 2.5 MG PO TABS: 2.5000 mg | ORAL_TABLET | Freq: Three times a day (TID) | ORAL | 0 refills | Status: DC

## 2019-02-24 MED ORDER — DOXERCALCIFEROL 4 MCG/2ML IV SOLN
INTRAVENOUS | Status: AC
  Filled 2019-02-24: qty 2

## 2019-02-24 MED ORDER — ALPRAZOLAM 0.25 MG PO TABS: 0.2500 mg | ORAL_TABLET | Freq: Two times a day (BID) | ORAL | 0 refills | Status: AC | PRN

## 2019-02-24 MED ORDER — HYDROXYZINE HCL 10 MG PO TABS: 10.0000 mg | ORAL_TABLET | Freq: Three times a day (TID) | ORAL | Status: DC | PRN

## 2019-02-24 MED ORDER — METOPROLOL TARTRATE 25 MG PO TABS: 12.5000 mg | ORAL_TABLET | ORAL | Status: DC

## 2019-02-24 MED ORDER — DOXERCALCIFEROL 4 MCG/2ML IV SOLN: 1.0000 ug | INTRAVENOUS | Status: AC

## 2019-02-24 MED ORDER — INSULIN DETEMIR 100 UNIT/ML ~~LOC~~ SOLN: 28.0000 [IU] | Freq: Every day | SUBCUTANEOUS | 0 refills | Status: DC

## 2019-02-24 MED ORDER — APIXABAN 2.5 MG PO TABS: 2.5000 mg | ORAL_TABLET | Freq: Two times a day (BID) | ORAL | 0 refills | Status: DC

## 2019-02-24 MED ORDER — ALPRAZOLAM 0.25 MG PO TABS: 0.2500 mg | ORAL_TABLET | Freq: Two times a day (BID) | ORAL | Status: DC | PRN

## 2019-02-24 MED ORDER — CLINDAMYCIN HCL 300 MG PO CAPS: 300.0000 mg | ORAL_CAPSULE | Freq: Three times a day (TID) | ORAL | 0 refills | Status: DC

## 2019-02-24 NOTE — Progress Notes (Signed)
Received call from [facility] liaison. Patient is active with them for PT and OT. Patient will need HH orders - PT, OT for resumption of care.   Bartholomew Crews, RN Transitions of Care 45M CM [phone]

## 2019-02-24 NOTE — TOC Transition Note (Signed)
Transition of Care [facility]) - CM/SW Discharge Note   Patient Details  Name: Jeffrey [name] MRN: [id number] Date of Birth: August 08, 1941  [facility] CM/SW Contact:  Bartholomew Crews, RN Phone Number: 02/24/2019, 4:19 PM   Clinical Narrative:    Received call from MD about plans for patient to transition home. Requested HH order for PT/OT in order for [facility] to resume therapy. Notified [facility] of plans for transition home.   Final next level of care: [place] Barriers to Discharge: No Barriers Identified   Patient Goals and CMS Choice        Discharge Placement    Home with [facility]                   Discharge Plan and Services                          Social Determinants of Health (SDOH) Interventions     Readmission Risk Interventions No flowsheet data found.

## 2019-02-24 NOTE — Progress Notes (Signed)
Discharged to home with family. Daughter [name] picked patient up. Aware of need to go by pharmacy to pick up medications. All belongings sent with patient. No questions at this time.

## 2019-02-24 NOTE — Discharge Summary (Addendum)
Jeffrey [name], is a 78 y.o. male  DOB 17-Apr-1941  MRN [id number].  Admission date:  02/19/2019  Admitting Physician  Rise Patience, MD  Discharge Date:  02/24/2019   Primary MD  Scot Jun, FNP  Recommendations for primary care physician for things to follow:    Blood pressures   Discharge Diagnosis   Principal Problem:   Cellulitis in diabetic foot [facility]) Active Problems:   Type 2 diabetes mellitus with hypoglycemia without coma ([place])   Essential hypertension   ESRD (end stage renal disease) on dialysis ([place])   Chronic combined systolic and diastolic CHF (congestive heart failure) ([place])   S/P TAVR (transcatheter aortic valve replacement)   Anemia of chronic disease   Atrial fibrillation ([place]) [I48.91]   Discoloration of skin of multiple sites of lower extremity   Chronic anticoagulation   Venous stasis   Hypotension      Past Medical History:  Diagnosis Date   Anginal pain ([place])    Aortic stenosis    a. severe by echo 09/2014   Atrial fibrillation ([place])    a. not well documented, not on anticoagulation   CHF (congestive heart failure) ([place])    04/28/17 echo-EF 40%, mod LVH, diastolic dysfunction   Claustrophobia    Complete heart block (HCC)    COPD (chronic obstructive pulmonary disease) ([place])    Coronary artery disease    a. chronically occluded RCA per cath 09/2014 with collaterals B. cath 05/01/17 chr occ RCA w/collaterals, 60-70% mid LAD,    CVA (cerebral vascular accident) ([place]) 10/2014   denies residual on 07/11/2015   ESRD (end stage renal disease) on dialysis [facility])    a. 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   29 mm Edwards Sapien 3 transcatheter heart valve placed via open left transfemoral approach   Type II diabetes mellitus ([place])     Past Surgical History:  Procedure Laterality Date   AV FISTULA PLACEMENT Left 10/19/2014   Procedure: BRACHIOCEPHALIC ARTERIOVENOUS (AV) FISTULA CREATION ;  Surgeon: Conrad Lester [place], MD;  Location: [facility] OR;  Service: Vascular;  Laterality: Left;   CARDIAC CATHETERIZATION     CARDIAC CATHETERIZATION N/A 07/22/2015   Procedure: Right/Left Heart Cath and Coronary Angiography;  Surgeon: [name], MD;  Location: [place] CV LAB;  Service: Cardiovascular;  Laterality: N/A;   CATARACT EXTRACTION W/ INTRAOCULAR LENS  IMPLANT, BILATERAL Bilateral 1990's   CHOLECYSTECTOMY OPEN  1980's   COLONOSCOPY W/ BIOPSIES AND POLYPECTOMY     CORONARY ANGIOGRAPHY N/A 07/31/2018   Procedure: CORONARY ANGIOGRAPHY;  Surgeon: Martinique, Peter M, MD;  Location:  [place] INVASIVE CV LAB;  Service: Cardiovascular;  Laterality: N/A;   CORONARY ANGIOPLASTY     CORONARY ATHERECTOMY N/A 04/18/2018   Procedure: CORONARY ATHERECTOMY;  Surgeon: [name], MD;  Location: [place] CV LAB;  Service: Cardiovascular;  Laterality: N/A;   CORONARY BALLOON ANGIOPLASTY N/A 07/31/2018   Procedure: CORONARY BALLOON ANGIOPLASTY;  Surgeon: Martinique, Peter M, MD;  Location: [place] CV LAB;  Service: Cardiovascular;  Laterality: N/A;   CORONARY STENT INTERVENTION N/A 04/18/2018   Procedure: CORONARY STENT INTERVENTION;  Surgeon: [name], MD;  Location: [place] CV LAB;  Service: Cardiovascular;  Laterality: N/A;   EP IMPLANTABLE DEVICE N/A 07/11/2015   Procedure: Pacemaker Implant;  Surgeon: [name], MD;  Location: [place] CV LAB;  Service:  Cardiovascular;  Laterality: N/A;   ESOPHAGOGASTRODUODENOSCOPY  08/01/2012   Procedure: ESOPHAGOGASTRODUODENOSCOPY (EGD);  Surgeon: [name], MD;  Location: [facility];  Service: Endoscopy;  Laterality: N/A;   INSERT / REPLACE / REMOVE PACEMAKER  07/11/2015   INSERTION OF DIALYSIS CATHETER Right 02/02/2015   Procedure: INSERTION OF DIALYSIS CATHETER  RIGHT INTERNAL JUGULAR;  Surgeon: Mal [name], MD;  Location: [place];  Service: Vascular;  Laterality: Right;   LEFT AND RIGHT HEART CATHETERIZATION WITH CORONARY ANGIOGRAM N/A 09/30/2014   Procedure: LEFT AND RIGHT HEART CATHETERIZATION WITH CORONARY ANGIOGRAM;  Surgeon: [name], MD;  Location: [facility] CATH LAB;  Service: Cardiovascular;  Laterality: N/A;   LEFT HEART CATH AND CORONARY ANGIOGRAPHY N/A 04/14/2018   Procedure: LEFT HEART CATH AND CORONARY ANGIOGRAPHY;  Surgeon: [name], MD;  Location: [place] CV LAB;  Service: Cardiovascular;  Laterality: N/A;   TEE WITHOUT CARDIOVERSION N/A 08/02/2015   Procedure: TRANSESOPHAGEAL ECHOCARDIOGRAM (TEE);  Surgeon: [name], MD;  Location: [place];  Service: Open Heart Surgery;  Laterality: N/A;   TONSILLECTOMY     TRANSCATHETER AORTIC VALVE REPLACEMENT, TRANSFEMORAL Left 08/02/2015   Procedure: TRANSCATHETER AORTIC VALVE REPLACEMENT, TRANSFEMORAL;  Surgeon: [name], MD;  Location: [place];  Service: Open Heart Surgery;  Laterality: Left;       HPI  from the history and physical done on the day of admission:  Jeffrey [name] is a 78 y.o. male with history of CAD status post stenting, ESRD on hemodialysis Tuesday Thursday Saturday, diabetes mellitus type 2, anemia, CHF, pacemaker placement, status post TAVR, A. fib on apixaban presents to the ER with complaints of increasing swelling of the left lower extremity over the last 4 days with discoloration of the middle 3 toes of the left foot over the last 2 days.  Patient has poor sensation below knee and has no pain as such.   Denies any trauma.  Denies fever chills.  Swelling is more on the left side than right.  ED Course: In the ER x-rays show soft tissue swelling.  No definite fractures.  CT left lower extremity arteriography does not show any definite obstruction.  Pulses are dopplerable.  Lactate was elevated which went up after 1 L fluid.  Given the discoloration of fourth and swelling patient admitted for further management and may need vascular input.    Hospital Course:   1.  Suspected cellulitis of the left lower extremity, venous stasis ulcer: Patient presents with lower extremity discoloration and erythema.  X-rays revealed subcutaneous dorsal soft tissue edema.  Lactic acid was elevated giving concern for possibility of infection along with elevated CRP of 10.8 checked on 3/29.  Patient had been started on empiric  antibiotics of cefepime and metronidazole.  Blood cultures have remained negative.  Doppler ultrasound of the lower extremities did not show any signs of a blood clot from.  Some improvement in erythema after switching antibiotics on 3/29, from cefepime and metronidazole to clindamycin IV. Continue clindamycin p.o. of 5-day course.  Ambulatory referral made for wound care.  2.  Lactic acidosis: A unresolved Lactic acid levels trended up as high as 4.4.  Suspect this could be related with possibility of infection versus poor perfusion given patient's fluid overload status.  Last check noted to be 3.8.  Patient remained afebrile and all other vitals were otherwise noted to be stable.  3.  ESRD on HD: Patient normally dialyzes Tuesday, Thursday, Saturday.  On physical exam patient appears to be fluid overloaded.  Patient last dialyzed on 3/31, but continues to appear overloaded.  Nephrology to arrange for increased dialysis 4 times weekly. -Per nephrology next scheduled for -Continue renal diet/carb modified with fluid restriction  4.  Systolic congestive heart failure: Acute on chronic.  Admission  chest x-ray noting mild interstitial edema with right-sided pleural effusion.  Last EF noted to be 45-50% with regional wall abnormalities on 04/17/2018.  On physical exam patient still with 2+ pitting edema.  5.  Hypotension: Acute on chronic.  Patient continued to have low systolic blood pressures in the 70-80s with heart rates in the 54-58 prior to discharge.  Suspect that hypoperfusion associated with blood pressures contributed to lactic acidosis. Started patient on Midodrine 2.5 mg 3 times daily with meals to help increase blood pressures.  6.  Peripheral vascular disease, venous stasis: CT arteriogram was negative for any acute vascular obstruction but did note significant atherosclerotic disease.  Vascular surgery evaluated the patient, but recommended treating with mild compression using 4' ACE bandages and leg elevation.  He was continue on aspirin and Plavix  7.  Diabetes mellitus type 2 with hypoglycemia: During his hospitalization he had several episodes of low blood sugar readings early in the morning.  Home regimen includes 48 units of long-acting insulin at night.  Patient long-acting insulin was decreased to 48 units nightly and patient was not noted to have any significant low blood glucose readings.  8.  CAD s/p stent: Patient denies any complaints of chest pain. Continued Plavix and statin   9.  Anxiety: Patient reported feeling very anxious at times for which he is unable to sleep..  Patient given a limited supply of low-dose Xanax 0.25 mg tablets to try and help with symptoms until able to follow-up with primary care provider.  10.  Atrial fibrillation on chronic anticoagulation: Patient appears currently rate controlled.  Patient was continued on Eliquis, but dose decreased from 5 mg to 2.5 mg twice daily per pharmacy.  11.  Anemia of chronic kidney disease: Hemoglobin 10.2 on 3/27 which appears near patient's baseline.  Receiving ESA per nephrology.  Hemoglobin 9.9 on  3/30.  12.  Hyperlipidemia: Continue atorvastatin  13.  S/p TAVR  14.  Baker's cyst of right leg: Incidental finding on Doppler ultrasound of lower extremity    Follow UP  Follow-up Information    Scot Jun, FNP. Schedule an appointment as soon as possible for a visit.   Specialty:  Family Medicine Contact information: [place] [address] [phone]        Deboraha Sprang, MD .   Specialty:  Cardiology Contact information: [phone] [address] [place] [address] [phone]  [facility] Follow up.   Why:  physical therapy, occupational therapy, and home health aide           Consults obtained: Nephrology, vascular surgery  Discharge Condition: Fair  Diet and Activity recommendation: See Discharge Instructions below   Discharge Instructions    Ambulatory referral to Wound Clinic   Complete by:  As directed    Venous stasis ulcers with right leg wound and left lower extremity wound with recent admission for cellulitis   Discharge instructions   Complete by:  As directed    Follow with Primary MD Scot Jun, FNP in 1-2 weeks.  You were diagnosed with an infection of the skin of your left foot known as cellulitis.  Complete antibiotics of clindamycin as prescribed.  Please keep wound area wrapped with gauze and changed daily.  A ambulatory referral to the wound care center has been made for continued treatment of wounds.  Nephrology is recommending that your dialysis sessions are increased to 4 times per week, and have notified your dialysis center.  Vascular surgery evaluated you during this hospitalization as well and recommended wrapping legs with mild compression with 4 inch Ace bandages and leg elevation.  In regards to anxiety you will need to follow-up with your primary care provider for further treatment.  You were given a short prescription of low-dose Xanax to try and help with  anxiety symptoms.  Lastly, your blood pressures have been on the low low side during your hospitalization and we are giving you a prescription of Midodrine to take with meals daily to help increase blood pressures.  Continue to take the 10 mg prior to hemodialysis sessions.  Please monitor your blood pressures at home and keep a log of readings.  Follow-up with your primary care provider as adjustments may be needed.   -   ( we routinely change or add medications that can affect your baseline labs and fluid status, therefore we recommend that you get the mentioned basic workup next visit with your PCP, your PCP may decide not to get them or add new tests based on their clinical decision)  Activity: Ambulate with assistance.  Fall precautions advised.  Disposition Home  Diet: renal/carb modified with fluid restriction Fluid restriction: 1200 mL      For Heart failure patients - Check your Weight same time everyday, if you gain over 2 pounds, or you develop in leg swelling, experience more shortness of breath or chest pain, call your Primary doctor immediately. Follow Cardiac Low Salt Diet and 1.5 lit/day fluid restriction.  Special Instructions: If you have smoked or chewed Tobacco  in the last 2 yrs please stop smoking, stop any regular Alcohol  and or any Recreational drug use.  On your next visit with your primary care physician please Get Medicines reviewed and adjusted.  Please request your Scot Jun, FNP to go over all Hospital Tests and Procedure/Radiological results at the follow up, please get all Hospital records sent to your Prim MD by signing hospital release before you go home.  If you experience worsening of your admission symptoms, develop shortness of breath, life threatening emergency, suicidal or homicidal thoughts you must seek medical attention immediately by calling 911 or calling your MD immediately  if symptoms less severe.  You Must read complete  instructions/literature along with all the possible adverse reactions/side effects for all the Medicines you take and that have been prescribed to you. Take any new Medicines after you have  completely understood and accpet all the possible adverse reactions/side effects.   Do not drive, operate heavy machinery, perform activities at heights, swimming or participation in water activities or provide baby sitting services if your were admitted for syncope or siezures until you have seen by Primary MD or a Neurologist and advised to do so again.  Do not drive when taking Pain medications.  Do not take more than prescribed Pain, Sleep and Anxiety Medications  Wear Seat belts while driving.   Please note  You were cared for by a hospitalist during your hospital stay. If you have any questions about your discharge medications or the care you received while you were in the hospital after you are discharged, you can call the unit and asked to speak with the hospitalist on call if the hospitalist that took care of you is not available. Once you are discharged, your primary care physician will handle any further medical issues. Please note that NO REFILLS for any discharge medications will be authorized once you are discharged, as it is imperative that you return to your primary care physician (or establish a relationship with a primary care physician if you do not have one) for your aftercare needs so that they can reassess your need for medications and monitor your lab values.        Discharge Medications     Allergies as of 02/24/2019      Reactions   Byetta 10 Mcg Pen [exenatide] Diarrhea, Nausea And Vomiting   Codeine Itching   Coumadin [warfarin Sodium] Rash   (wife states coumadin was stopped but rash did not disappear 02/21/19)      Medication List    STOP taking these medications   erythromycin ophthalmic ointment   olopatadine 0.1 % ophthalmic solution Commonly known as:  PATANOL    triamcinolone cream 0.1 % Commonly known as:  KENALOG     TAKE these medications   acetaminophen 500 MG tablet Commonly known as:  TYLENOL Take 500 mg by mouth every 6 (six) hours as needed for headache (pain).   albuterol 108 (90 Base) MCG/ACT inhaler Commonly known as:  PROVENTIL HFA;VENTOLIN HFA Inhale 2 puffs into the lungs every 4 (four) hours as needed for wheezing or shortness of breath.   albuterol (2.5 MG/3ML) 0.083% nebulizer solution Commonly known as:  PROVENTIL Take 3 mLs (2.5 mg total) by nebulization every 6 (six) hours as needed for wheezing or shortness of breath.   ALPRAZolam 0.25 MG tablet Commonly known as:  XANAX Take 1 tablet (0.25 mg total) by mouth 2 (two) times daily as needed for anxiety.   apixaban 2.5 MG Tabs tablet Commonly known as:  ELIQUIS Take 1 tablet (2.5 mg total) by mouth 2 (two) times daily. What changed:    medication strength  how much to take  when to take this   atorvastatin 80 MG tablet Commonly known as:  LIPITOR Take 1 tablet (80 mg total) by mouth daily at 6 PM. What changed:  when to take this   budesonide-formoterol 160-4.5 MCG/ACT inhaler Commonly known as:  Symbicort Inhale 2 puffs into the lungs 2 (two) times daily.   cinacalcet 30 MG tablet Commonly known as:  SENSIPAR Take 30 mg by mouth See admin instructions. Take one tablet (30 mg) by mouth on Tuesday, Thursday, Saturday after dialysis   clindamycin 300 MG capsule Commonly known as:  CLEOCIN Take 1 capsule (300 mg total) by mouth 3 (three) times daily.   clopidogrel 75 MG  tablet Commonly known as:  PLAVIX Take 1 tablet (75 mg total) by mouth daily. What changed:  when to take this   doxercalciferol 4 MCG/2ML injection Commonly known as:  HECTOROL Inject 0.5 mLs (1 mcg total) into the vein Every Tuesday,Thursday,and Saturday with dialysis. Start taking on:  February 26, 2019   eucerin cream Apply 1 application topically at bedtime.   guaiFENesin 600 MG  12 hr tablet Commonly known as:  Mucinex Take 1 tablet (600 mg total) by mouth 2 (two) times daily. What changed:    when to take this  reasons to take this   hydrOXYzine 25 MG tablet Commonly known as:  ATARAX/VISTARIL Take 0.5-1 tablets (12.5-25 mg total) by mouth 2 (two) times daily as needed for itching.   insulin detemir 100 UNIT/ML injection Commonly known as:  LEVEMIR Inject 0.28 mLs (28 Units total) into the skin at bedtime. What changed:  how much to take   Insulin Pen Needle 31G X 6 MM Misc at bedtime.   lidocaine-prilocaine cream Commonly known as:  EMLA Apply 1 application topically See admin instructions. Apply topically prior to dialysis (Tuesday, Thursday, Saturday)   loratadine 10 MG tablet Commonly known as:  CLARITIN Take 10 mg by mouth daily as needed for allergies.   Melatonin 5 MG Tabs Take 10 mg by mouth at bedtime as needed (sleep).   metoprolol tartrate 25 MG tablet Commonly known as:  LOPRESSOR Take 0.5 tablets (12.5 mg total) by mouth See admin instructions. Take 1/2 tablet (12.5 mg) by mouth on Sunday, Monday, Wednesday, Friday mornings (non-dialysis days), take 1/2 tablet (12.5 mg) every evening   midodrine 10 MG tablet Commonly known as:  PROAMATINE Take 10 mg by mouth See admin instructions. Take one tablet (10 mg) by mouth 30 minutes before dialysis on Tuesday, Thursday, Saturday What changed:  Another medication with the same name was added. Make sure you understand how and when to take each.   midodrine 2.5 MG tablet Commonly known as:  PROAMATINE Take 1 tablet (2.5 mg total) by mouth 3 (three) times daily with meals. Patient should continue to take 10 mg in the morning prior to hemodialysis sessions. Start taking on:  February 25, 2019 What changed:  You were already taking a medication with the same name, and this prescription was added. Make sure you understand how and when to take each.   nitroGLYCERIN 0.4 MG SL tablet Commonly known  as:  Nitrostat Place 1 tablet (0.4 mg total) under the tongue every 5 (five) minutes as needed for chest pain.   OXYGEN Inhale 2-3 L into the lungs continuous. 2-3 lpm 24/7   pantoprazole 40 MG tablet Commonly known as:  PROTONIX Take 1 tablet (40 mg total) by mouth daily. What changed:  when to take this   sevelamer carbonate 800 MG tablet Commonly known as:  RENVELA Take 1,600-2,400 mg by mouth See admin instructions. Take 3 tablets (2400 mg) by mouth with meals and 2 tablets (1600 mg) with snacks       Major procedures and Radiology Reports - PLEASE review detailed and final reports for all details, in brief -      Ct Angio Low Extrem Left W &/or Wo Contrast  Result Date: 02/20/2019 CLINICAL DATA:  Cold left foot, sudden onset, vascular compromise suspected. There is also history of diabetes with left-sided foot discoloration becoming worse over 2 months. EXAM: CT ANGIOGRAPHY LOWER LEFT EXTREMITY TECHNIQUE: Arterially timed CTA of the left lower extremity was performed  after bolus administration of intravenous contrast. CONTRAST:  130mL OMNIPAQUE IOHEXOL 350 MG/ML SOLN COMPARISON:  None similar FINDINGS: Iliacs: Atherosclerotic calcification without dissection or flow limiting stenosis. Common femoral: Atherosclerosis without flow limiting stenosis or dissection. Superficial femoral artery: Heavily calcified main trunk and multiple orders of downstream branches which are not well evaluated due to the degree of calcified plaque blooming relative to the lumen size. There is up to 70% atheromatous stenosis in the distal SFA due to bulky calcified plaque. No acute occlusion is seen. Popliteal: Heavily calcified wall with diffuse mild to moderate narrowing. Leg: The tibioperoneal trunk and proximal peroneal artery is patent. Below the upper calf vessels are heavily calcified such that luminal density is not confidently measurable. There is no definite flow within branches of the foot.  Nonvascular: Fatty left inguinal hernia. Enlarged prostate with bladder wall thickening. Small volume pelvic ascites. Diffuse subcutaneous edema in the left lower extremity without soft tissue gas or abscess. No bony erosion or joint destruction. Advanced knee osteoarthritis. Review of the MIP images confirms the above findings. IMPRESSION: Heavily and diffusely calcified arteries which precludes luminal visualization beyond the upper calf. No acute occlusion is seen. There is extensive moderate to advanced atheromatous narrowing of the SFA and popliteal arteries. Electronically Signed   By: Monte Fantasia M.D.   On: 02/20/2019 04:29   Dg Chest Port 1 View  Result Date: 02/20/2019 CLINICAL DATA:  Initial evaluation for elevated lactate. EXAM: PORTABLE CHEST 1 VIEW COMPARISON:  Prior radiograph from 11/29/2018 FINDINGS: Right-sided pacemaker/AICD in place. Advanced cardiomegaly, stable. Mediastinal silhouette normal. Aortic atherosclerosis. Lungs mildly hypoinflated. Diffuse pulmonary vascular and interstitial congestion, consistent with mild diffuse pulmonary interstitial edema. No consolidative space opacity. No pneumothorax. Probable trace right pleural effusion. Osseous structures within normal limits. IMPRESSION: 1. Cardiomegaly with mild diffuse pulmonary interstitial edema and small right pleural effusion. 2. Aortic atherosclerosis. Electronically Signed   By: Jeannine Boga M.D.   On: 02/20/2019 03:54   Dg Foot Complete Left  Result Date: 02/20/2019 CLINICAL DATA:  Bruising and discoloration for 2 days. EXAM: LEFT FOOT - COMPLETE 3+ VIEW COMPARISON:  Left foot radiograph 01/13/2009 FINDINGS: Hammertoe deformity of the digits which limits assessment. No fracture, dislocation, or bony destructive change. Midfoot osteoarthritis. Probable remote fracture of the third proximal metatarsal, unchanged in appearance from prior exam. Small plantar calcaneal spur. There are dense vascular calcifications.  Soft tissue edema noted about the dorsum of the foot. No soft tissue air. IMPRESSION: 1. Dorsal soft tissue edema.  No acute osseous abnormality. 2. Midfoot osteoarthritis. Hammertoe deformity of the digits limits assessment. Electronically Signed   By: Keith Rake M.D.   On: 02/20/2019 00:25   Vas [place] Lower Extremity Venous (dvt)  Result Date: 02/22/2019  Lower Venous Study Indications: Edema.  Limitations: Poor ultrasound/tissue interface. Performing Technologist: Antonieta Pert RDMS, RVT  Examination Guidelines: A complete evaluation includes B-mode imaging, spectral Doppler, color Doppler, and power Doppler as needed of all accessible portions of each vessel. Bilateral testing is considered an integral part of a complete examination. Limited examinations for reoccurring indications may be performed as noted.  Right Venous Findings: +---------+---------------+---------+-----------+----------+------------------+            Compressibility Phasicity Spontaneity Properties Summary             +---------+---------------+---------+-----------+----------+------------------+  CFV       Full            Yes       Yes                                        +---------+---------------+---------+-----------+----------+------------------+  SFJ       Full                                                                 +---------+---------------+---------+-----------+----------+------------------+  FV Prox   Full                                                                 +---------+---------------+---------+-----------+----------+------------------+  FV Mid    Full                                                                 +---------+---------------+---------+-----------+----------+------------------+  FV Distal Full                                                                 +---------+---------------+---------+-----------+----------+------------------+  PFV       Full                                                                  +---------+---------------+---------+-----------+----------+------------------+  POP       Full            Yes       Yes                                        +---------+---------------+---------+-----------+----------+------------------+  PTV                                                        limited                                                                         visualization       +---------+---------------+---------+-----------+----------+------------------+  PERO  limited                                                                         visualization       +---------+---------------+---------+-----------+----------+------------------+  GSV       Full                                                                 +---------+---------------+---------+-----------+----------+------------------+  Left Venous Findings: +---------+---------------+---------+-----------+----------+-------+            Compressibility Phasicity Spontaneity Properties Summary  +---------+---------------+---------+-----------+----------+-------+  CFV       Full                                                      +---------+---------------+---------+-----------+----------+-------+  SFJ       Full                                                      +---------+---------------+---------+-----------+----------+-------+  FV Prox   Full                                                      +---------+---------------+---------+-----------+----------+-------+  FV Mid    Full                                                      +---------+---------------+---------+-----------+----------+-------+  FV Distal Full                                                      +---------+---------------+---------+-----------+----------+-------+  PFV       Full                                                       +---------+---------------+---------+-----------+----------+-------+  POP       Full                                                      +---------+---------------+---------+-----------+----------+-------+  GSV       Full                                                      +---------+---------------+---------+-----------+----------+-------+ Hypoechoic lesions seen in left groin, ? lymph nodes, largest measuring 3.5 x 2.3 x 1.0cm. Atherosclerotic changes seen throughout.    Summary: Right: There is no evidence of deep vein thrombosis in the lower extremity. However, portions of this examination were limited- see technologist comments above. A cystic structure is found in the popliteal fossa. Baker's Cyst aprox 2.8x 1.5x 1.6cm Left: There is no evidence of deep vein thrombosis in the lower extremity. However, portions of this examination were limited- see technologist comments above. No cystic structure found in the popliteal fossa. Atherosclerotic changes noted.  *See table(s) above for measurements and observations. Electronically signed by [name] MD on 02/22/2019 at 6:45:11 AM.    Final     Micro Results    Recent Results (from the past 240 hour(s))  Culture, blood (routine x 2)     Status: None (Preliminary result)   Collection Time: 02/20/19  9:20 AM  Result Value Ref Range Status   Specimen Description BLOOD RIGHT ANTECUBITAL  Final   Special Requests   Final    BOTTLES DRAWN AEROBIC ONLY Blood Culture adequate volume   Culture   Final    NO GROWTH 4 DAYS Performed at [facility], [address] [place], [place] [address]    Report Status PENDING  Incomplete  Culture, blood (routine x 2)     Status: None (Preliminary result)   Collection Time: 02/20/19  9:33 AM  Result Value Ref Range Status   Specimen Description BLOOD RIGHT ANTECUBITAL  Final   Special Requests   Final    BOTTLES DRAWN AEROBIC ONLY Blood Culture adequate volume   Culture   Final    NO GROWTH 4  DAYS Performed at [facility], [place] [address] [place], [place] [address]    Report Status PENDING  Incomplete  MRSA PCR Screening     Status: None   Collection Time: 02/20/19  7:06 PM  Result Value Ref Range Status   MRSA by PCR NEGATIVE NEGATIVE Final    Comment:        The GeneXpert MRSA Assay (FDA approved for NASAL specimens only), is one component of a comprehensive MRSA colonization surveillance program. It is not intended to diagnose MRSA infection nor to guide or monitor treatment for MRSA infections. Performed at [facility], [place] [address] [place], [place] [address]        Today   Subjective    Jeffrey [name] today reports that he still is having significant anxiety and has not been able to sleep since over the last few weeks.  Objective   Blood pressure (!) 80/53, pulse (!) 55, temperature 97.6 F (36.4 C), temperature source Oral, resp. rate 18, height 5\' 11"  (1.803 m), weight 101.7 kg, SpO2 100 %.   Intake/Output Summary (Last 24 hours) at 02/24/2019 1730 Last data filed at 02/24/2019 1117 Gross per 24 hour  Intake 0 ml  Output 1956 ml  Net -1956 ml    Exam Constitutional: Obese male in NAD, calm, comfortable Eyes: PERRL, lids and conjunctivae normal ENMT: Mucous membranes are moist. Posterior pharynx clear of  any exudate or lesions.  Neck: normal, supple, no masses, no thyromegaly Respiratory: Normal respiratory effort with decreased overall aeration.  Patient on 4 L of nasal cannula.  Positive crackles appreciated in lower lung fields. Cardiovascular: Bradycardic no murmurs / rubs / gallops.  2+ pitting edema to the bilateral lower extremities. 2+ pedal pulses. No carotid bruits.  Abdomen: no tenderness, no masses palpated. No hepatosplenomegaly. Bowel sounds positive.  Musculoskeletal: no clubbing / cyanosis. No joint deformity upper and lower extremities. Good ROM, no contractures. Normal muscle tone.  Skin: dark red discoloration of  the left foot appears more likely associated with venous stasis at this time.  Second toe with wound present with serosanguineous fluid present. Neurologic: CN 2-12 grossly intact. Sensation intact, DTR normal. Strength 5/5 in all 4.  Psychiatric: Normal judgment and insight. Alert and oriented x 3. Normal mood.      Data Review   CBC w Diff:  Lab Results  Component Value Date   WBC 9.9 02/23/2019   HGB 9.7 (L) 02/23/2019   HGB 9.2 (L) 01/07/2019   HCT 32.2 (L) 02/23/2019   HCT 29.6 (L) 01/07/2019   PLT 196 02/23/2019   PLT 211 01/07/2019   LYMPHOPCT 16 02/23/2019   MONOPCT 15 02/23/2019   EOSPCT 3 02/23/2019   BASOPCT 1 02/23/2019    CMP:  Lab Results  Component Value Date   NA 134 (L) 02/23/2019   NA 137 01/07/2019   K 3.9 02/23/2019   CL 93 (L) 02/23/2019   CO2 23 02/23/2019   BUN 23 02/23/2019   BUN 29 (H) 01/07/2019   CREATININE 3.78 (H) 02/23/2019   PROT 6.3 (L) 02/20/2019   PROT 6.1 01/07/2019   ALBUMIN 3.1 (L) 02/23/2019   ALBUMIN 3.7 01/07/2019   BILITOT 1.6 (H) 02/20/2019   BILITOT 0.8 01/07/2019   ALKPHOS 92 02/20/2019   AST 24 02/20/2019   ALT 17 02/20/2019  .   Total Time in preparing paper work, data evaluation and todays exam - 35 minutes  Norval Morton M.D on 02/24/2019 at 5:30 PM  [place]  [phone]

## 2019-02-24 NOTE — Evaluation (Signed)
Physical Therapy Evaluation Patient Details Name: Jeffrey [name] MRN: [id number] DOB: [birth year] Today's Date: 02/24/2019   History of Present Illness  Jeffrey [name] is a 78 y.o. male with history of CAD status post stenting, ESRD on hemodialysis Tuesday Thursday Saturday, diabetes mellitus type 2, anemia, CHF, pacemaker placement, status post TAVR, A. fib on apixaban presents to the ER with complaints of increasing swelling of the left lower extremity over the last 4 days with discoloration of the middle 3 toes of the left foot over the last 2 days.   Clinical Impression   Pt admitted with above diagnosis. Pt currently with functional limitations due to the deficits listed below (see PT Problem List). Admitted from home, where he gets a considerable amount of assist for mobiltiy and ADLs; Reports he doesn't walk much or far at baseline; dependent on wheelchair for mobility, and assist from wife and daughter for ADLs; Presents with decr functional mobility, decr activity tolerance, generalized weakness; Good tolerance of weight bearing bil feet;  Pt will benefit from skilled PT to increase their independence and safety with mobility to allow discharge to the venue listed below.       Follow Up Recommendations Home health PT;Supervision/Assistance - 24 hour(REcommend re-starting HH serices)    Equipment Recommendations  None recommended by PT(pretty well-equipped)    Recommendations for Other Services       Precautions / Restrictions Precautions Precautions: Fall      Mobility  Bed Mobility               General bed mobility comments: Sitting EOB upon arrival  Transfers Overall transfer level: Needs assistance Equipment used: Rolling walker (2 wheeled) Transfers: Sit to/from Stand Sit to Stand: Mod assist         General transfer comment: mod assist to power up; cues for hand placement  Ambulation/Gait Ambulation/Gait assistance: Jeffrey guard;+2 safety/equipment Gait  Distance (Feet): (pivotal steps bed to chair) Assistive device: Rolling walker (2 wheeled) Gait Pattern/deviations: Shuffle     General Gait Details: Good use of RW for support; fatigues very quickly post HD  Stairs            Wheelchair Mobility    Modified Rankin (Stroke Patients Only)       Balance Overall balance assessment: Needs assistance Sitting-balance support: Feet supported Sitting balance-Leahy Scale: Fair       Standing balance-Leahy Scale: Poor                               Pertinent Vitals/Pain Pain Assessment: Faces Faces Pain Scale: Hurts a little bit Pain Location: feet Pain Intervention(s): Repositioned    Home Living Family/patient expects to be discharged to:: Private residence Living Arrangements: Spouse/significant other;Children Available Help at Discharge: Family;Available 24 hours/day Type of Home: House Home Access: Ramped entrance     Home Layout: One level Home Equipment: Walker - 2 wheels;Grab bars - toilet;Grab bars - tub/shower;Cane - single point;Bedside commode;Walker - 4 wheels Additional Comments: home O2    Prior Function Level of Independence: Needs assistance   Gait / Transfers Assistance Needed: Initially, pt ambulatory with RW. Over past couple weeks, has required increasing assist for transfers with RW. Since last week, pt max-totalA due to weakness  ADL's / Homemaking Assistance Needed: Wife assist for all ADLs. Past few days, wife providing bed baths.   Comments: Daughter works as Designer, multimedia at senior living facility and hesitant to have  pt d/c to SNF     Hand Dominance        Extremity/Trunk Assessment   Upper Extremity Assessment Upper Extremity Assessment: Generalized weakness    Lower Extremity Assessment Lower Extremity Assessment: Generalized weakness(lower LEs erythematous, dressing applied)       Communication   Communication: HOH  Cognition Arousal/Alertness: Awake/alert Behavior  During Therapy: WFL for tasks assessed/performed Overall Cognitive Status: Within Functional Limits for tasks assessed                                 General Comments: for simple mobility      General Comments General comments (skin integrity, edema, etc.): On 3 L supplemental O2, which is his baseline    Exercises     Assessment/Plan    PT Assessment Patient needs continued PT services  PT Problem List Decreased strength;Decreased range of motion;Decreased activity tolerance;Decreased balance;Decreased mobility;Decreased coordination;Decreased knowledge of use of DME;Decreased safety awareness;Decreased knowledge of precautions;Impaired sensation;Cardiopulmonary status limiting activity       PT Treatment Interventions DME instruction;Gait training;Functional mobility training;Therapeutic activities;Therapeutic exercise;Balance training;Patient/family education    PT Goals (Current goals can be found in the Care Plan section)  Acute Rehab PT Goals Patient Stated Goal: Hopes to be home soon; wants to be able t sleep better PT Goal Formulation: With patient Time For Goal Achievement: 03/10/19 Potential to Achieve Goals: Good    Frequency Jeffrey 3X/week   Barriers to discharge        Co-evaluation               AM-PAC PT "6 Clicks" Mobility  Outcome Measure Help needed turning from your back to your side while in a flat bed without using bedrails?: None Help needed moving from lying on your back to sitting on the side of a flat bed without using bedrails?: A Little Help needed moving to and from a bed to a chair (including a wheelchair)?: A Little Help needed standing up from a chair using your arms (e.g., wheelchair or bedside chair)?: A Little Help needed to walk in hospital room?: A Little Help needed climbing 3-5 steps with a railing? : A Lot 6 Click Score: 18    End of Session Equipment Utilized During Treatment: Gait belt Activity Tolerance:  Patient limited by fatigue Patient left: in chair;with call bell/phone within reach;with chair alarm set Nurse Communication: Mobility status PT Visit Diagnosis: Unsteadiness on feet (R26.81);Other abnormalities of gait and mobility (R26.89)    Time: 1405-1430 PT Time Calculation (Jeffrey) (ACUTE ONLY): 25 Jeffrey   Charges:   PT Evaluation $PT Eval Moderate Complexity: 1 Mod PT Treatments $Therapeutic Activity: 8-22 mins        Roney [place], PT  Acute Rehabilitation Services Pager [phone] Office [place] 02/24/2019, 4:03 PM

## 2019-02-24 NOTE — Progress Notes (Signed)
PT Cancellation Note  Patient Details Name: Jeffrey [name] MRN: [id number] DOB: 02-05-1941   Cancelled Treatment:    Reason Eval/Treat Not Completed: Patient at procedure or test/unavailable   Currently in HD;  Roney [place], [place]  Acute Rehabilitation Services Pager [phone] Office [phone]    [place] 02/24/2019, 8:29 AM

## 2019-02-24 NOTE — Progress Notes (Addendum)
[facility] Progress Note   Dialysis Orders: TTS [place] 4hr, 200 dialyzer, 400/800, EDW 104kg, 2K/2.25Ca, AVF, UFP #2 - Mircera 225 q 2 weeks (last 3/24) - Venofer 50 IV weekly - Hectoral 44mcg IV q HD  Assessment/Plan: 1. BLE edema:? Cellulitis L >R versusvenous stasis changes - remains grossly overloaded. On empiric Cefepime/Flagyl. VVS consulted, no interventional studies planned at this time. LE u/s negative for DVT.- less erthyma in  LE - needs to elevate/compress; needs to restrict fluid intake 2. ESRD: TTS - have arranged for 4 day a week dialysis going forward - he has transportation - his problem in part is high IDWG - ok from a renal perspective for d/c today - starting on 4 K - labs pending - may need 3 k bath at d/c 3. Hypotension/volume: Chronic low BP, on midodrine. Below prior EDW pre HD today - will lower EDW for d/c and continue to try to titrate down. 4. Anemia: Not due for ESA yet. hgb 9.7 stable. 5. Metabolic bone disease: Ca ok, Phos ok .Continue hectorol /sensipar/renvela 6. Nutrition: Alb low, start pro-stat supplements. 7. Hx CAD/TAVR/afib s/p PM, TAVR, stents: Needs volume down. 8. COPD 9. Loose stools x 1 week: Follow. 10.  T2DM: On insulin, per primary. 11.  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  Anxiety - defer to primary - not an issue this am.  Myriam Jacobson, PA-C [place] 02/24/2019,8:50 AM  LOS: 3 days   Pt seen, examined and agree w A/P as above. See rec's above, OK for dc from renal standpoint.  [facility] 02/24/2019, 5:30 PM    Subjective:   Seen on dialysis. No SOB at rest.  Objective Vitals:   02/24/19 0717 02/24/19 0730 02/24/19 0800 02/24/19 0830  BP: (!) 78/31 (!) 82/35 (!) 82/36 (!) 78/36  Pulse: (!) 55 (!) 55 (!) 54 (!) 56  Resp: 18 16 (!) 25 (!) 22  Temp:      TempSrc:      SpO2:      Weight:      Height:       Physical Exam General: NAD on HD Heart:  RRR Lungs: clear anteriorly Abdomen: soft NT Extremities: 3 + edema erythema to feet -pitting less dense than before legs wrapped with ACE  Dialysis Access: left AVF   Additional Objective Labs: Basic Metabolic Panel: Recent Labs  Lab 02/20/19 0026 02/20/19 0650 02/23/19 0602  NA 135 135 134*  K 4.3 4.7 3.9  CL 94* 94* 93*  CO2 26 24 23   GLUCOSE 115* 167* 124*  BUN 16 20 23   CREATININE 3.27* 3.59* 3.78*  CALCIUM 8.6* 8.5* 8.3*  PHOS  --   --  3.2   Liver Function Tests: Recent Labs  Lab 02/20/19 0650 02/23/19 0602  AST 24  --   ALT 17  --   ALKPHOS 92  --   BILITOT 1.6*  --   PROT 6.3*  --   ALBUMIN 2.9* 3.1*   No results for input(s): LIPASE, AMYLASE in the last 168 hours. CBC: Recent Labs  Lab 02/20/19 0026 02/20/19 0650 02/23/19 0602 02/23/19 0746  WBC 9.3 9.4 9.2 9.9  NEUTROABS 6.4 6.7 6.0  --   HGB 10.2* 10.2* 9.9* 9.7*  HCT 34.6* 33.7* 32.0* 32.2*  MCV 96.1 98.3 95.5 94.4  PLT 197 201 186 196   Blood Culture    Component Value Date/Time   SDES BLOOD RIGHT ANTECUBITAL 02/20/2019 0933   SPECREQUEST  02/20/2019 0933    BOTTLES DRAWN AEROBIC ONLY Blood Culture adequate volume   CULT  02/20/2019 0933    NO GROWTH 4 DAYS Performed at [facility], [place] [address], [place], [place] [address]    REPTSTATUS PENDING 02/20/2019 4128    Cardiac Enzymes: No results for input(s): CKTOTAL, CKMB, CKMBINDEX, TROPONINI in the last 168 hours. CBG: Recent Labs  Lab 02/23/19 0652 02/23/19 1245 02/23/19 1639 02/23/19 2054 02/24/19 0639  GLUCAP 125* 84 130* 108* 154*   Iron Studies: No results for input(s): IRON, TIBC, TRANSFERRIN, FERRITIN in the last 72 hours. Lab Results  Component Value Date   INR 2.4 (H) 02/20/2019   INR 2.5 12/19/2017   INR 2.2 11/21/2017   Studies/Results: No results found. Medications:  . apixaban  2.5 mg Oral BID  . atorvastatin  80 mg Oral q1800  . Chlorhexidine Gluconate Cloth  6 each Topical Q0600  .  cinacalcet  30 mg Oral Q T,Th,Sat-1800  . clindamycin  300 mg Oral Q8H  . clopidogrel  75 mg Oral Daily  . doxercalciferol  1 mcg Intravenous Q T,Th,Sa-HD  . feeding supplement (PRO-STAT SUGAR FREE 64)  30 mL Oral BID  . guaiFENesin  600 mg Oral BID  . insulin aspart  0-9 Units Subcutaneous TID WC  . insulin detemir  28 Units Subcutaneous QHS  . loratadine  10 mg Oral Daily  . midodrine  10 mg Oral Q T,Th,Sa-HD  . mometasone-formoterol  2 puff Inhalation BID  . olopatadine  1 drop Right Eye BID  . pantoprazole  40 mg Oral Daily  . sevelamer carbonate  2,400 mg Oral TID WC

## 2019-02-25 LAB — CULTURE, BLOOD (ROUTINE X 2)
CULTURE: NO GROWTH
Culture: NO GROWTH
Special Requests: ADEQUATE
Special Requests: ADEQUATE

## 2019-02-26 ENCOUNTER — Telehealth: Payer: Self-pay | Admitting: Family Medicine

## 2019-02-26 NOTE — Telephone Encounter (Signed)
Therapist called stating that pt wants to take precaution and refuses therapy because of covid-19, but wants to continue therapy in 2 weeks, will call back in two weeks.

## 2019-02-26 NOTE — Telephone Encounter (Signed)
FYI

## 2019-02-26 NOTE — Telephone Encounter (Signed)
That's completely ok and reasonable.

## 2019-03-04 ENCOUNTER — Other Ambulatory Visit: Payer: Self-pay | Admitting: Family Medicine

## 2019-03-04 MED ORDER — METOPROLOL TARTRATE 25 MG PO TABS: 12.5000 mg | ORAL_TABLET | ORAL | 1 refills | Status: DC

## 2019-03-04 NOTE — Telephone Encounter (Signed)
Let Jeffrey [name] know that Metoprolol was sent & to continue dose that was started in hospital.

## 2019-03-04 NOTE — Telephone Encounter (Signed)
He is to continue the metoprolol dose of 12.5 mg (1/2 tablet) and medication should be taken  as follows:  Take 1/2 tablet (12.5 mg) by mouth on Sunday, Monday, Wednesday, Friday mornings (non-dialysis days),  Everyday including dialysis days he will take an evening dose of metoprolol 1/2 tablet (12.5 mg).

## 2019-03-04 NOTE — Telephone Encounter (Signed)
Caller Name: [name] (daughter)   Reason for Call: medication request for  ALPRAZolam Duanne Moron) 0.25 MG tablet [[id number]] (she is aware PCP does not fill this medication) metoprolol tartrate (LOPRESSOR) 25 MG tablet [[id number]] (states that cardiologist told them to contatct PCP office)    If this is a medication request: confirm pharmacy  [facility] [address] [address] in [place] call back number: [phone]   Action taken by recipient of request:

## 2019-03-04 NOTE — Telephone Encounter (Signed)
Please advise on Metoprolol dosing. At patient's last visit his Metoprolol dose was 1/2 of 25 mg tablet BID. Since that visit he has been hospitalized & the dose was changed. Can you verify what he should be on?

## 2019-03-18 ENCOUNTER — Ambulatory Visit (INDEPENDENT_AMBULATORY_CARE_PROVIDER_SITE_OTHER): Payer: Medicare Other | Admitting: Vascular Surgery

## 2019-03-18 ENCOUNTER — Other Ambulatory Visit: Payer: Self-pay | Admitting: *Deleted

## 2019-03-18 ENCOUNTER — Other Ambulatory Visit: Payer: Self-pay

## 2019-03-18 ENCOUNTER — Encounter: Payer: Self-pay | Admitting: Vascular Surgery

## 2019-03-18 ENCOUNTER — Ambulatory Visit (HOSPITAL_COMMUNITY)
Admission: RE | Admit: 2019-03-18 | Discharge: 2019-03-18 | Disposition: A | Discharge status: Home / Self Care | Payer: Medicare Other | Source: Ambulatory Visit | Attending: Vascular Surgery | Admitting: Vascular Surgery

## 2019-03-18 ENCOUNTER — Encounter: Payer: Self-pay | Admitting: *Deleted

## 2019-03-18 VITALS — BP 85/46 | HR 55 | Temp 97.9°F | Resp 20 | Ht 71.0 in | Wt 220.0 lb

## 2019-03-18 DIAGNOSIS — I739 Peripheral vascular disease, unspecified: Secondary | ICD-10-CM

## 2019-03-18 DIAGNOSIS — I70262 Atherosclerosis of native arteries of extremities with gangrene, left leg: Secondary | ICD-10-CM | POA: Diagnosis not present

## 2019-03-18 NOTE — Progress Notes (Signed)
REASON FOR CONSULT:    Peripheral vascular disease with nonhealing wound of the foot.  The consult is requested by Dr. Otelia Santee.  ASSESSMENT & PLAN:   CRITICAL LIMB ISCHEMIA: This patient has nonhealing wounds on the dorsum of the left foot.  He has a markedly reduced toe pressure on the left and I think without revascularization certainly these wounds are progressed and he will require a primary amputation.  Given that he is likely not a candidate for prosthesis this would be an above-the-knee amputation.  I have explained to him that I think his only chance for limb salvage would be to proceed with arteriography to see what options he might have for revascularization and hopefully he would be a candidate for an endovascular approach.  I was able to palpate a left femoral pulse but was unable to palpate a right femoral pulse.  This may have been related to having to examine him in the wheelchair.  Obviously if he is not have a right femoral access this is going to make an endovascular approach on the left an issue.   I have reviewed with the patient the indications for arteriography. In addition, I have reviewed the potential complications of arteriography including but not limited to: Bleeding, arterial injury, arterial thrombosis, dye action, renal insufficiency, or other unpredictable medical problems. I have explained to the patient that if we find disease amenable to angioplasty we could potentially address this at the same time. I have discussed the potential complications of angioplasty and stenting, including but not limited to: Bleeding, arterial thrombosis, arterial injury, dissection, or the need for surgical intervention.  He dialyzes on Mondays, Tuesdays, Thursdays, and Saturdays.  Therefore, we will schedule his procedure on a Wednesday or Friday.  We will stop have to stop his Eliquis for 48 hours prior to the procedure.  [name], MD, FACS Beeper [phone] Office:  [phone]   HPI:   Jeffrey [name] is a pleasant 78 y.o. male, who was referred with nonhealing wounds of the left foot.  This is a very pleasant gentleman he states that the best he can tell he had developed significant swelling in both feet and developed some blisters over the dorsum of his left foot which subsequently developed into these wounds that are documented below.  He has had treatment with a honey ointment and the wounds have not improved.  I believe this was done by the home health nurse.  Because of the COVID-19 situation ultimately home health was unable to come for continued follow-up.  He presents now for evaluation for peripheral vascular disease.  His risk factors for peripheral vascular disease include diabetes and tobacco use.  He quit tobacco in 1985.  He denies any history of hypertension, hypercholesterolemia, or family history of premature cardiovascular disease.  He is on dialysis and dialyzes on Mondays, Tuesdays, Thursdays, and Saturdays.  He is on Eliquis.  He is undergone a previous aortic valve replacement.   He has had 4 previous heart attacks.  Most recently about a year ago.  He has a pacemaker and is followed by Dr. Adam Phenix.  He has had aortic valve replacement.  I have reviewed the records that were sent from the kidney center.  Patient was seen by Dr. Otelia Santee and felt to have peripheral vascular disease.  He was noted to have a nonhealing wound on the foot.  Of note he is on Eliquis.  Past Medical History:  Diagnosis Date  . Anginal  pain ([place])   . Aortic stenosis    a. severe by echo 09/2014  . Atrial fibrillation ([place])    a. not well documented, not on anticoagulation  . CHF (congestive heart failure) ([place])    04/28/17 echo-EF 40%, mod LVH, diastolic dysfunction  . Claustrophobia   . Complete heart block ([place])   . COPD (chronic obstructive pulmonary disease) ([place])   . Coronary artery disease    a. chronically occluded RCA per cath 09/2014  with collaterals B. cath 05/01/17 chr occ RCA w/collaterals, 60-70% mid LAD,   . CVA (cerebral vascular accident) ([place]) 10/2014   denies residual on 07/11/2015  . ESRD (end stage renal disease) on dialysis [facility])    a. on dialysis; [facility]; MWF, LUE fistula (07/11/2015)  . History of blood transfusion    "related to gallbladder OR"  . History of stomach ulcers   . Hyperlipidemia   . Hypertension   . Iron deficiency anemia   . Myocardial infarction ([place]) 10/2014  . Peripheral vascular disease ([place])   . Pneumonia   . Presence of permanent cardiac pacemaker   . 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 Type II diabetes mellitus ([facility])     Family History  Problem Relation Age of Onset  . Diabetes Father   . Heart disease Father   . Diabetes Sister   . Alzheimer's disease Mother   . Stroke Mother   . Diabetes Paternal Grandmother   . Alzheimer's disease Maternal Aunt        x 8 Maternal Aunts  . Cancer Maternal Uncle        type unknown    SOCIAL HISTORY: Social History   Socioeconomic History  . Marital status: Married    Spouse name: Not on file  . Number of children: 3  . Years of education: Not on file  . Highest education level: Not on file  Occupational History  . Occupation: retired  Scientific laboratory technician  . Financial resource strain: Not on file  . Food insecurity:    Worry: Not on file    Inability: Not on file  . Transportation needs:    Medical: Not on file    Non-medical: Not on file  Tobacco Use  . Smoking status: Former Smoker    Packs/day: 2.00    Years: 32.00    Pack years: 64.00    Last attempt to quit: 11/27/1983    Years since quitting: 35.3  . Smokeless tobacco: Never Used  Substance and Sexual Activity  . Alcohol use: Yes    Comment: rarely  . Drug use: No  . Sexual activity: Yes    Birth control/protection: None  Lifestyle  .  Physical activity:    Days per week: Not on file    Minutes per session: Not on file  . Stress: Not on file  Relationships  . Social connections:    Talks on phone: Not on file    Gets together: Not on file    Attends religious service: Not on file    Active member of club or organization: Not on file    Attends meetings of clubs or organizations: Not on file    Relationship status: Not on file  . Intimate partner violence:    Fear of current or ex partner: Not on file    Emotionally abused: Not on file  Physically abused: Not on file    Forced sexual activity: Not on file  Other Topics Concern  . Not on file  Social History Narrative  . Not on file    Allergies  Allergen Reactions  . Byetta 10 Mcg Pen [Exenatide] Diarrhea and Nausea And Vomiting  . Codeine Itching  . Coumadin [Warfarin Sodium] Rash    (wife states coumadin was stopped but rash did not disappear 02/21/19)    Current Outpatient Medications  Medication Sig Dispense Refill  . acetaminophen (TYLENOL) 500 MG tablet Take 500 mg by mouth every 6 (six) hours as needed for headache (pain).    [name] albuterol (PROVENTIL HFA;VENTOLIN HFA) 108 (90 Base) MCG/ACT inhaler Inhale 2 puffs into the lungs every 4 (four) hours as needed for wheezing or shortness of breath. 1 Inhaler 11  . albuterol (PROVENTIL) (2.5 MG/3ML) 0.083% nebulizer solution Take 3 mLs (2.5 mg total) by nebulization every 6 (six) hours as needed for wheezing or shortness of breath. 150 mL 1  . ALPRAZolam (XANAX) 0.25 MG tablet Take 1 tablet (0.25 mg total) by mouth 2 (two) times daily as needed for anxiety. 15 tablet 0  . apixaban (ELIQUIS) 2.5 MG TABS tablet Take 1 tablet (2.5 mg total) by mouth 2 (two) times daily. 60 tablet 0  . atorvastatin (LIPITOR) 80 MG tablet Take 1 tablet (80 mg total) by mouth daily at 6 PM. (Patient taking differently: Take 80 mg by mouth every evening. ) 90 tablet 3  . budesonide-formoterol (SYMBICORT) 160-4.5 MCG/ACT inhaler  Inhale 2 puffs into the lungs 2 (two) times daily. 1 Inhaler 0  . cinacalcet (SENSIPAR) 30 MG tablet Take 30 mg by mouth See admin instructions. Take one tablet (30 mg) by mouth on Tuesday, Thursday, Saturday after dialysis    . clindamycin (CLEOCIN) 300 MG capsule Take 1 capsule (300 mg total) by mouth 3 (three) times daily. 7 capsule 0  . clopidogrel (PLAVIX) 75 MG tablet Take 1 tablet (75 mg total) by mouth daily. (Patient taking differently: Take 75 mg by mouth every evening. ) 90 tablet 3  . doxercalciferol (HECTOROL) 4 MCG/2ML injection Inject 0.5 mLs (1 mcg total) into the vein Every Tuesday,Thursday,and Saturday with dialysis.    [name] guaiFENesin (MUCINEX) 600 MG 12 hr tablet Take 1 tablet (600 mg total) by mouth 2 (two) times daily. (Patient taking differently: Take 600 mg by mouth 2 (two) times daily as needed for cough or to loosen phlegm. ) 60 tablet 1  . hydrOXYzine (ATARAX/VISTARIL) 25 MG tablet Take 0.5-1 tablets (12.5-25 mg total) by mouth 2 (two) times daily as needed for itching. 30 tablet 1  . insulin detemir (LEVEMIR) 100 UNIT/ML injection Inject 0.28 mLs (28 Units total) into the skin at bedtime. 10 mL 0  . Insulin Pen Needle 31G X 6 MM MISC at bedtime.     . lidocaine-prilocaine (EMLA) cream Apply 1 application topically See admin instructions. Apply topically prior to dialysis (Tuesday, Thursday, Saturday)    . loratadine (CLARITIN) 10 MG tablet Take 10 mg by mouth daily as needed for allergies.     . Melatonin 5 MG TABS Take 10 mg by mouth at bedtime as needed (sleep).    . metoprolol tartrate (LOPRESSOR) 25 MG tablet Take 0.5 tablets (12.5 mg total) by mouth See admin instructions. Take 1/2 tablet (12.5 mg) by mouth on Sunday, Monday, Wednesday, Friday mornings (non-dialysis days), take 1/2 tablet (12.5 mg) every evening 90 tablet 1  . midodrine (PROAMATINE) 10 MG tablet  Take 10 mg by mouth See admin instructions. Take one tablet (10 mg) by mouth 30 minutes before dialysis on  Tuesday, Thursday, Saturday    . midodrine (PROAMATINE) 2.5 MG tablet Take 1 tablet (2.5 mg total) by mouth 3 (three) times daily with meals. Patient should continue to take 10 mg in the morning prior to hemodialysis sessions. 90 tablet 0  . nitroGLYCERIN (NITROSTAT) 0.4 MG SL tablet Place 1 tablet (0.4 mg total) under the tongue every 5 (five) minutes as needed for chest pain. 30 tablet 12  . OXYGEN Inhale 2-3 L into the lungs continuous. 2-3 lpm 24/7     . pantoprazole (PROTONIX) 40 MG tablet Take 1 tablet (40 mg total) by mouth daily. (Patient taking differently: Take 40 mg by mouth every evening. ) 90 tablet 3  . sevelamer carbonate (RENVELA) 800 MG tablet Take 1,600-2,400 mg by mouth See admin instructions. Take 3 tablets (2400 mg) by mouth with meals and 2 tablets (1600 mg) with snacks    . Skin Protectants, Misc. (EUCERIN) cream Apply 1 application topically at bedtime.     No current facility-administered medications for this visit.     REVIEW OF SYSTEMS:  [X]  denotes positive finding, [ ]  denotes negative finding Cardiac  Comments:  Chest pain or chest pressure: x   Shortness of breath upon exertion: x   Short of breath when lying flat: x   Irregular heart rhythm: x       Vascular    Pain in calf, thigh, or hip brought on by ambulation: x   Pain in feet at night that wakes you up from your sleep:  x   Blood clot in your veins:    Leg swelling:  x       Pulmonary    Oxygen at home: x   Productive cough:     Wheezing:         Neurologic    Sudden weakness in arms or legs:  x   Sudden numbness in arms or legs:  x   Sudden onset of difficulty speaking or slurred speech:    Temporary loss of vision in one eye:     Problems with dizziness:         Gastrointestinal    Blood in stool:     Vomited blood:         Genitourinary    Burning when urinating:     Blood in urine:        Psychiatric    Major depression:  x       Hematologic    Bleeding problems:    Problems  with blood clotting too easily:        Skin    Rashes or ulcers:        Constitutional    Fever or chills:     PHYSICAL EXAM:   Vitals:   03/18/19 1126  BP: (!) 85/46  Pulse: (!) 55  Resp: 20  Temp: 97.9 F (36.6 C)  SpO2: 100%  Weight: 220 lb (99.8 kg)  Height: 5\' 11"  (1.803 m)   Body mass index is 30.68 kg/m.  GENERAL: The patient is a well-nourished male, in no acute distress. The vital signs are documented above. CARDIAC: There is a regular rate and rhythm.  VASCULAR: I do not detect carotid bruits. He was unable to get out of the wheelchair so I had to examine him in the wheelchair.  I was unable to palpate a right femoral  pulse although again this may be related to positioning.  I was able to palpate a left femoral pulse. I was unable to palpate a popliteal or pedal pulses. He has mild swelling bilaterally. He has some lipodermatosclerosis bilaterally. PULMONARY: There is good air exchange bilaterally without wheezing or rales. ABDOMEN: Soft and non-tender with normal pitched bowel sounds.  He is obese and it is difficult to assess for an aneurysm. MUSCULOSKELETAL: There are no major deformities or cyanosis. NEUROLOGIC: No focal weakness or paresthesias are detected. SKIN: He has multiple wounds on the dorsum of his left foot.    PSYCHIATRIC: The patient has a normal affect.  DATA:    ARTERIAL DOPPLER STUDY: I have independently interpreted his arterial Doppler study today.  On the right side he has a monophasic dorsalis pedis and posterior tibial signal.  The arteries are noncompressible.  His toe pressure on the right is 44 mmHg.  On the left side he has a monophasic dorsalis pedis and posterior tibial signal.  The arteries are not compressible.  The toe pressure is 28 mmHg.

## 2019-03-23 ENCOUNTER — Telehealth: Payer: Self-pay | Admitting: Vascular Surgery

## 2019-03-23 NOTE — Telephone Encounter (Signed)
Pt wife called regarding wound care for foot.  I instructed her to wash once daily with soap and water and to put dry gauze between toes to wick away moisture.  She can cover the foot with a dry dressing  Ruta Hinds, MD [facility] [place]: [phone] Pager: [phone]

## 2019-03-27 ENCOUNTER — Encounter (HOSPITAL_COMMUNITY): Payer: Self-pay

## 2019-03-27 ENCOUNTER — Ambulatory Visit (HOSPITAL_COMMUNITY)
Admission: RE | Admit: 2019-03-27 | Discharge: 2019-03-27 | Disposition: A | Discharge status: Home / Self Care | Payer: Medicare Other | Attending: Vascular Surgery | Admitting: Vascular Surgery

## 2019-03-27 ENCOUNTER — Encounter (HOSPITAL_COMMUNITY)
Admission: RE | Disposition: A | Discharge status: Home / Self Care | Payer: Self-pay | Source: Home / Self Care | Attending: Vascular Surgery

## 2019-03-27 ENCOUNTER — Other Ambulatory Visit: Payer: Self-pay

## 2019-03-27 DIAGNOSIS — Z79899 Other long term (current) drug therapy: Secondary | ICD-10-CM | POA: Insufficient documentation

## 2019-03-27 DIAGNOSIS — Z87891 Personal history of nicotine dependence: Secondary | ICD-10-CM | POA: Insufficient documentation

## 2019-03-27 DIAGNOSIS — Z794 Long term (current) use of insulin: Secondary | ICD-10-CM | POA: Insufficient documentation

## 2019-03-27 DIAGNOSIS — N186 End stage renal disease: Secondary | ICD-10-CM | POA: Insufficient documentation

## 2019-03-27 DIAGNOSIS — I5033 Acute on chronic diastolic (congestive) heart failure: Secondary | ICD-10-CM | POA: Diagnosis not present

## 2019-03-27 DIAGNOSIS — I459 Conduction disorder, unspecified: Secondary | ICD-10-CM | POA: Diagnosis not present

## 2019-03-27 DIAGNOSIS — I252 Old myocardial infarction: Secondary | ICD-10-CM | POA: Diagnosis not present

## 2019-03-27 DIAGNOSIS — E785 Hyperlipidemia, unspecified: Secondary | ICD-10-CM | POA: Insufficient documentation

## 2019-03-27 DIAGNOSIS — Z7901 Long term (current) use of anticoagulants: Secondary | ICD-10-CM | POA: Insufficient documentation

## 2019-03-27 DIAGNOSIS — I4891 Unspecified atrial fibrillation: Secondary | ICD-10-CM | POA: Insufficient documentation

## 2019-03-27 DIAGNOSIS — I350 Nonrheumatic aortic (valve) stenosis: Secondary | ICD-10-CM | POA: Insufficient documentation

## 2019-03-27 DIAGNOSIS — Z992 Dependence on renal dialysis: Secondary | ICD-10-CM | POA: Insufficient documentation

## 2019-03-27 DIAGNOSIS — I25119 Atherosclerotic heart disease of native coronary artery with unspecified angina pectoris: Secondary | ICD-10-CM | POA: Insufficient documentation

## 2019-03-27 DIAGNOSIS — E1122 Type 2 diabetes mellitus with diabetic chronic kidney disease: Secondary | ICD-10-CM | POA: Insufficient documentation

## 2019-03-27 DIAGNOSIS — E1151 Type 2 diabetes mellitus with diabetic peripheral angiopathy without gangrene: Secondary | ICD-10-CM | POA: Insufficient documentation

## 2019-03-27 DIAGNOSIS — J449 Chronic obstructive pulmonary disease, unspecified: Secondary | ICD-10-CM | POA: Diagnosis not present

## 2019-03-27 DIAGNOSIS — I132 Hypertensive heart and chronic kidney disease with heart failure and with stage 5 chronic kidney disease, or end stage renal disease: Secondary | ICD-10-CM | POA: Diagnosis not present

## 2019-03-27 DIAGNOSIS — E11621 Type 2 diabetes mellitus with foot ulcer: Secondary | ICD-10-CM | POA: Insufficient documentation

## 2019-03-27 DIAGNOSIS — I70244 Atherosclerosis of native arteries of left leg with ulceration of heel and midfoot: Secondary | ICD-10-CM

## 2019-03-27 DIAGNOSIS — L97529 Non-pressure chronic ulcer of other part of left foot with unspecified severity: Secondary | ICD-10-CM | POA: Insufficient documentation

## 2019-03-27 HISTORY — PX: ABDOMINAL AORTOGRAM W/LOWER EXTREMITY: CATH118223

## 2019-03-27 LAB — POCT I-STAT 4, (NA,K, GLUC, HGB,HCT)
Glucose, Bld: 214 mg/dL — ABNORMAL HIGH (ref 70–99)
HCT: 41 % (ref 39.0–52.0)
Hemoglobin: 13.9 g/dL (ref 13.0–17.0)
Potassium: 5 mmol/L (ref 3.5–5.1)
Sodium: 135 mmol/L (ref 135–145)

## 2019-03-27 LAB — GLUCOSE, CAPILLARY
Glucose-Capillary: 199 mg/dL — ABNORMAL HIGH (ref 70–99)
Glucose-Capillary: 197 mg/dL — ABNORMAL HIGH (ref 70–99)

## 2019-03-27 SURGERY — ABDOMINAL AORTOGRAM W/LOWER EXTREMITY
Anesthesia: LOCAL | Laterality: Bilateral

## 2019-03-27 MED ORDER — SODIUM CHLORIDE 0.9% FLUSH: 3.0000 mL | Freq: Two times a day (BID) | INTRAVENOUS | Status: DC

## 2019-03-27 MED ORDER — SODIUM CHLORIDE 0.9% FLUSH: 3.0000 mL | INTRAVENOUS | Status: DC | PRN

## 2019-03-27 MED ORDER — MIDAZOLAM HCL 2 MG/2ML IJ SOLN
INTRAMUSCULAR | Status: DC | PRN
  Administered 2019-03-27: 0.5 mg via INTRAVENOUS

## 2019-03-27 MED ORDER — HYDRALAZINE HCL 20 MG/ML IJ SOLN: 5.0000 mg | INTRAMUSCULAR | Status: DC | PRN

## 2019-03-27 MED ORDER — SODIUM CHLORIDE 0.9 % IV SOLN: 250.0000 mL | INTRAVENOUS | Status: DC | PRN

## 2019-03-27 MED ORDER — MIDAZOLAM HCL 2 MG/2ML IJ SOLN
INTRAMUSCULAR | Status: AC
  Filled 2019-03-27: qty 2

## 2019-03-27 MED ORDER — ACETAMINOPHEN 325 MG PO TABS: 650.0000 mg | ORAL_TABLET | ORAL | Status: DC | PRN

## 2019-03-27 MED ORDER — ONDANSETRON HCL 4 MG/2ML IJ SOLN: 4.0000 mg | Freq: Four times a day (QID) | INTRAMUSCULAR | Status: DC | PRN

## 2019-03-27 MED ORDER — IODIXANOL 320 MG/ML IV SOLN
INTRAVENOUS | Status: DC | PRN
  Administered 2019-03-27: 12:00:00 125 mL via INTRA_ARTERIAL

## 2019-03-27 MED ORDER — HEPARIN (PORCINE) IN NACL 1000-0.9 UT/500ML-% IV SOLN
INTRAVENOUS | Status: AC
  Filled 2019-03-27: qty 1000

## 2019-03-27 MED ORDER — OXYCODONE HCL 5 MG PO TABS: 5.0000 mg | ORAL_TABLET | ORAL | Status: DC | PRN

## 2019-03-27 MED ORDER — LABETALOL HCL 5 MG/ML IV SOLN: 10.0000 mg | INTRAVENOUS | Status: DC | PRN

## 2019-03-27 MED ORDER — FENTANYL CITRATE (PF) 100 MCG/2ML IJ SOLN
INTRAMUSCULAR | Status: DC | PRN
  Administered 2019-03-27: 25 ug via INTRAVENOUS

## 2019-03-27 MED ORDER — LIDOCAINE HCL (PF) 1 % IJ SOLN
INTRAMUSCULAR | Status: AC
  Filled 2019-03-27: qty 30

## 2019-03-27 MED ORDER — HEPARIN (PORCINE) IN NACL 1000-0.9 UT/500ML-% IV SOLN
INTRAVENOUS | Status: DC | PRN
  Administered 2019-03-27 (×2): 500 mL

## 2019-03-27 MED ORDER — FENTANYL CITRATE (PF) 100 MCG/2ML IJ SOLN
INTRAMUSCULAR | Status: AC
  Filled 2019-03-27: qty 2

## 2019-03-27 MED ORDER — LIDOCAINE HCL (PF) 1 % IJ SOLN
INTRAMUSCULAR | Status: DC | PRN
  Administered 2019-03-27: 15 mL

## 2019-03-27 SURGICAL SUPPLY — 12 items
CATH ANGIO 5F PIGTAIL 65CM (CATHETERS) ×1 IMPLANT
CATH CROSS OVER TEMPO 5F (CATHETERS) ×1 IMPLANT
CATH STRAIGHT 5FR 65CM (CATHETERS) ×1 IMPLANT
KIT MICROPUNCTURE NIT STIFF (SHEATH) ×1 IMPLANT
KIT PV (KITS) ×2 IMPLANT
SHEATH PINNACLE 5F 10CM (SHEATH) ×1 IMPLANT
SHEATH PROBE COVER 6X72 (BAG) ×1 IMPLANT
SYR MEDRAD MARK V 150ML (SYRINGE) ×1 IMPLANT
TRANSDUCER W/STOPCOCK (MISCELLANEOUS) ×2 IMPLANT
TRAY PV CATH (CUSTOM PROCEDURE TRAY) ×2 IMPLANT
WIRE HITORQ VERSACORE ST 145CM (WIRE) ×1 IMPLANT
WIRE ROSEN-J .035X180CM (WIRE) ×1 IMPLANT

## 2019-03-27 NOTE — Op Note (Signed)
PATIENT: Jeffrey [name]      MRN: [id number] DOB: [birth year]    DATE OF PROCEDURE: 03/27/2019  INDICATIONS:    Jeffrey [name] is a 78 y.o. male who developed a blister on the dorsum of his left foot and developed wounds on the toes.  He had evidence of infrainguinal arterial occlusive disease and presents for arteriography and possible intervention.  PROCEDURE:    1.  Conscious sedation 2.  Ultrasound-guided access to the right common femoral artery 3.  Aortogram with bilateral iliac arteriogram 4.  Selective catheterization of the left external iliac artery with left lower extremity runoff 5.  Retrograde right femoral arteriogram with right lower extremity runoff  SURGEON: Judeth Cornfield. Scot Dock, MD, FACS  ANESTHESIA: Local with sedation  EBL: Minimal  TECHNIQUE: The patient was taken to the peripheral vascular lab and was sedated.  The period of conscious sedation was 51 minutes.  During that time period, I was present face-to-face 100% of the time.  The patient was administered half a milligram of Versed and 25 mcg of fentanyl. The patient's heart rate, blood pressure, and oxygen saturation were monitored by the nurse continuously during the procedure.  Both groins were prepped and draped in the usual sterile fashion.  Under ultrasound guidance, after the skin was anesthetized, I cannulated the right common femoral artery with a micropuncture needle and a micropuncture sheath was introduced over a wire.  This was exchanged for a 5 [place] sheath over a Bentson wire.  By ultrasound the femoral artery was patent. A real-time image was obtained and sent to the server.  The pigtail catheter was positioned at the L1 vertebral body and flush aortogram obtained.  The catheter was then positioned above the aortic bifurcation and an oblique iliac projection was obtained.  Next I exchanged the pigtail catheter for a crossover catheter which was positioned into the left common iliac artery.  The  wire was advanced into the external iliac artery and the crossover catheter exchanged for a straight catheter.  Selective left external iliac arteriogram was obtained with left lower extremity runoff.  This catheter was then removed and a retrograde right femoral arteriogram was obtained with right lower extremity runoff.  The patient was then transferred to the holding area for removal of the sheath.  No immediate complications were noted.  FINDINGS:   1.  There are single renal arteries bilaterally with no significant renal artery stenosis.  The infrarenal aorta, bilateral common iliac arteries, bilateral external iliac arteries, and bilateral hypogastric arteries are open. 2. On the left side, which is the side of concern, there is diffuse calcific disease throughout his common femoral, superficial femoral, popliteal, and tibial vessels.  However, the superficial femoral artery, popliteal artery arteries are patent without critical stenosis.  There is diffuse disease in the posterior tibial, peroneal, and anterior tibial arteries but the vessels are patent.  The most diseased artery is the anterior tibial artery which has several areas of severe disease.  These arteries are markedly calcified. 3.  On the right side, the common femoral, deep femoral, and superficial femoral arteries are patent.  There is diffuse calcific disease throughout the superficial femoral artery which has moderate diffuse disease.  The popliteal artery has moderate calcific disease.  There is severe tibial disease on the right.  CLINICAL NOTE: Based on his arteriogram I think he does have reasonable flow to the left foot with no real good options for revascularization.  I would continue with aggressive wound  care and I have discussed this with his wife.  I will see him back in the office to follow his wounds.   [name], MD, FACS [facility]  DATE OF DICTATION:   03/27/2019

## 2019-03-27 NOTE — Discharge Instructions (Signed)
Femoral Site Care °This sheet gives you information about how to care for yourself after your procedure. Your health care provider may also give you more specific instructions. If you have problems or questions, contact your health care provider. °What can I expect after the procedure? °After the procedure, it is common to have: °· Bruising that usually fades within 1-2 weeks. °· Tenderness at the site. °Follow these instructions at home: °Wound care °· Follow instructions from your health care provider about how to take care of your insertion site. Make sure you: °? Wash your hands with soap and water before you change your bandage (dressing). If soap and water are not available, use hand sanitizer. °? Change your dressing as told by your health care provider. °? Leave stitches (sutures), skin glue, or adhesive strips in place. These skin closures may need to stay in place for 2 weeks or longer. If adhesive strip edges start to loosen and curl up, you may trim the loose edges. Do not remove adhesive strips completely unless your health care provider tells you to do that. °· Do not take baths, swim, or use a hot tub until your health care provider approves. °· You may shower 24-48 hours after the procedure or as told by your health care provider. °? Gently wash the site with plain soap and water. °? Pat the area dry with a clean towel. °? Do not rub the site. This may cause bleeding. °· Do not apply powder or lotion to the site. Keep the site clean and dry. °· Check your femoral site every day for signs of infection. Check for: °? Redness, swelling, or pain. °? Fluid or blood. °? Warmth. °? Pus or a bad smell. °Activity °· For the first 2-3 days after your procedure, or as long as directed: °? Avoid climbing stairs as much as possible. °? Do not squat. °· Do not lift anything that is heavier than 10 lb (4.5 kg), or the limit that you are told, until your health care provider says that it is safe. °· Rest as  directed. °? Avoid sitting for a long time without moving. Get up to take short walks every 1-2 hours. °· Do not drive for 24 hours if you were given a medicine to help you relax (sedative). °General instructions °· Take over-the-counter and prescription medicines only as told by your health care provider. °· Keep all follow-up visits as told by your health care provider. This is important. °Contact a health care provider if you have: °· A fever or chills. °· You have redness, swelling, or pain around your insertion site. °Get help right away if: °· The catheter insertion area swells very fast. °· You pass out. °· You suddenly start to sweat or your skin gets clammy. °· The catheter insertion area is bleeding, and the bleeding does not stop when you hold steady pressure on the area. °· The area near or just beyond the catheter insertion site becomes pale, cool, tingly, or numb. °These symptoms may represent a serious problem that is an emergency. Do not wait to see if the symptoms will go away. Get medical help right away. Call your local emergency services (911 in the [place] Do not drive yourself to the hospital. °Summary °· After the procedure, it is common to have bruising that usually fades within 1-2 weeks. °· Check your femoral site every day for signs of infection. °· Do not lift anything that is heavier than 10 lb (4.5 kg), or the   limit that you are told, until your health care provider says that it is safe. °This information is not intended to replace advice given to you by your health care provider. Make sure you discuss any questions you have with your health care provider. °Document Released: 07/16/2014 Document Revised: 11/25/2017 Document Reviewed: 11/25/2017 °Elsevier Interactive Patient Education © 2019 [employer] ° °

## 2019-03-27 NOTE — Progress Notes (Signed)
Site area: Right groin a 5 french arterial sheath was removed by Diamantina Providence RN  Site Prior to Removal:  Level 0  Pressure Applied For 20 MINUTES    Bedrest Beginning at 1300p  Manual:   Yes.    Patient Status During Pull:  stable  Post Pull Groin Site:  Level 0  Post Pull Instructions Given:  Yes.    Post Pull Pulses Present:  Yes.    Dressing Applied:  Yes.    Comments:  VS remain stable

## 2019-03-27 NOTE — Progress Notes (Signed)
Discharge instructions given to wife, Jacqlyn Larsen, via telephone due to [place] restrictions.  All questions answered and Becky verbalized understanding of instructions

## 2019-03-30 ENCOUNTER — Encounter (HOSPITAL_COMMUNITY): Payer: Self-pay | Admitting: Vascular Surgery

## 2019-03-30 ENCOUNTER — Telehealth: Payer: Self-pay | Admitting: Vascular Surgery

## 2019-03-30 NOTE — Telephone Encounter (Signed)
sch appt spk to pt mld ltr 04/29/2019 1020am f/u MD

## 2019-03-30 NOTE — Telephone Encounter (Signed)
-----   Message from Mena Goes, RN sent at 03/27/2019  1:19 PM EDT ----- Regarding: FW: charge and f/u  ----- Message ----- From: Angelia Mould, MD Sent: 03/27/2019  12:10 PM EDT To: [facility] Charge Pool Subject: charge and f/u                                   PROCEDURE:   1.  Conscious sedation 2.  Ultrasound-guided access to the right common femoral artery 3.  Aortogram with bilateral iliac arteriogram 4.  Selective catheterization of the left external iliac artery with left lower extremity runoff 5.  Retrograde right femoral arteriogram with right lower extremity runoff  SURGEON: Judeth Cornfield. Scot Dock, MD, FACS He needs a follow-up visit in 3 to 4 weeks to check on his left foot.  Thank you. CD

## 2019-04-10 ENCOUNTER — Other Ambulatory Visit: Payer: Self-pay

## 2019-04-10 ENCOUNTER — Emergency Department (HOSPITAL_COMMUNITY): Payer: Medicare Other

## 2019-04-10 ENCOUNTER — Inpatient Hospital Stay (HOSPITAL_COMMUNITY)
Admission: EM | Admit: 2019-04-10 | Discharge: 2019-04-18 | DRG: 853 | Disposition: A | Discharge status: Home Health | Payer: Medicare Other | Attending: Internal Medicine | Admitting: Internal Medicine

## 2019-04-10 ENCOUNTER — Encounter (HOSPITAL_COMMUNITY): Payer: Self-pay | Admitting: Emergency Medicine

## 2019-04-10 DIAGNOSIS — Z79899 Other long term (current) drug therapy: Secondary | ICD-10-CM

## 2019-04-10 DIAGNOSIS — R7989 Other specified abnormal findings of blood chemistry: Secondary | ICD-10-CM | POA: Diagnosis present

## 2019-04-10 DIAGNOSIS — K5641 Fecal impaction: Secondary | ICD-10-CM | POA: Diagnosis present

## 2019-04-10 DIAGNOSIS — K219 Gastro-esophageal reflux disease without esophagitis: Secondary | ICD-10-CM | POA: Diagnosis present

## 2019-04-10 DIAGNOSIS — K5903 Drug induced constipation: Secondary | ICD-10-CM | POA: Diagnosis not present

## 2019-04-10 DIAGNOSIS — Z8701 Personal history of pneumonia (recurrent): Secondary | ICD-10-CM

## 2019-04-10 DIAGNOSIS — J9611 Chronic respiratory failure with hypoxia: Secondary | ICD-10-CM | POA: Diagnosis present

## 2019-04-10 DIAGNOSIS — N2581 Secondary hyperparathyroidism of renal origin: Secondary | ICD-10-CM | POA: Diagnosis present

## 2019-04-10 DIAGNOSIS — Z823 Family history of stroke: Secondary | ICD-10-CM

## 2019-04-10 DIAGNOSIS — I878 Other specified disorders of veins: Secondary | ICD-10-CM | POA: Diagnosis present

## 2019-04-10 DIAGNOSIS — I739 Peripheral vascular disease, unspecified: Secondary | ICD-10-CM

## 2019-04-10 DIAGNOSIS — D638 Anemia in other chronic diseases classified elsewhere: Secondary | ICD-10-CM | POA: Diagnosis not present

## 2019-04-10 DIAGNOSIS — S88119A Complete traumatic amputation at level between knee and ankle, unspecified lower leg, initial encounter: Secondary | ICD-10-CM | POA: Diagnosis not present

## 2019-04-10 DIAGNOSIS — I5084 End stage heart failure: Secondary | ICD-10-CM | POA: Diagnosis not present

## 2019-04-10 DIAGNOSIS — I252 Old myocardial infarction: Secondary | ICD-10-CM

## 2019-04-10 DIAGNOSIS — I132 Hypertensive heart and chronic kidney disease with heart failure and with stage 5 chronic kidney disease, or end stage renal disease: Secondary | ICD-10-CM | POA: Diagnosis present

## 2019-04-10 DIAGNOSIS — M869 Osteomyelitis, unspecified: Secondary | ICD-10-CM | POA: Diagnosis not present

## 2019-04-10 DIAGNOSIS — I5032 Chronic diastolic (congestive) heart failure: Secondary | ICD-10-CM | POA: Diagnosis not present

## 2019-04-10 DIAGNOSIS — E118 Type 2 diabetes mellitus with unspecified complications: Secondary | ICD-10-CM | POA: Diagnosis not present

## 2019-04-10 DIAGNOSIS — Z993 Dependence on wheelchair: Secondary | ICD-10-CM

## 2019-04-10 DIAGNOSIS — N186 End stage renal disease: Secondary | ICD-10-CM | POA: Diagnosis present

## 2019-04-10 DIAGNOSIS — Z9842 Cataract extraction status, left eye: Secondary | ICD-10-CM

## 2019-04-10 DIAGNOSIS — E1169 Type 2 diabetes mellitus with other specified complication: Secondary | ICD-10-CM | POA: Diagnosis present

## 2019-04-10 DIAGNOSIS — E1152 Type 2 diabetes mellitus with diabetic peripheral angiopathy with gangrene: Secondary | ICD-10-CM | POA: Diagnosis present

## 2019-04-10 DIAGNOSIS — Z992 Dependence on renal dialysis: Secondary | ICD-10-CM | POA: Diagnosis not present

## 2019-04-10 DIAGNOSIS — R778 Other specified abnormalities of plasma proteins: Secondary | ICD-10-CM | POA: Diagnosis present

## 2019-04-10 DIAGNOSIS — Z951 Presence of aortocoronary bypass graft: Secondary | ICD-10-CM | POA: Diagnosis not present

## 2019-04-10 DIAGNOSIS — I2510 Atherosclerotic heart disease of native coronary artery without angina pectoris: Secondary | ICD-10-CM | POA: Diagnosis present

## 2019-04-10 DIAGNOSIS — Z953 Presence of xenogenic heart valve: Secondary | ICD-10-CM

## 2019-04-10 DIAGNOSIS — I96 Gangrene, not elsewhere classified: Secondary | ICD-10-CM | POA: Diagnosis present

## 2019-04-10 DIAGNOSIS — E1122 Type 2 diabetes mellitus with diabetic chronic kidney disease: Secondary | ICD-10-CM | POA: Diagnosis present

## 2019-04-10 DIAGNOSIS — E785 Hyperlipidemia, unspecified: Secondary | ICD-10-CM | POA: Diagnosis present

## 2019-04-10 DIAGNOSIS — Z9841 Cataract extraction status, right eye: Secondary | ICD-10-CM

## 2019-04-10 DIAGNOSIS — Z833 Family history of diabetes mellitus: Secondary | ICD-10-CM

## 2019-04-10 DIAGNOSIS — D631 Anemia in chronic kidney disease: Secondary | ICD-10-CM | POA: Diagnosis present

## 2019-04-10 DIAGNOSIS — K08109 Complete loss of teeth, unspecified cause, unspecified class: Secondary | ICD-10-CM | POA: Diagnosis present

## 2019-04-10 DIAGNOSIS — E1151 Type 2 diabetes mellitus with diabetic peripheral angiopathy without gangrene: Secondary | ICD-10-CM | POA: Diagnosis present

## 2019-04-10 DIAGNOSIS — I5042 Chronic combined systolic (congestive) and diastolic (congestive) heart failure: Secondary | ICD-10-CM | POA: Diagnosis present

## 2019-04-10 DIAGNOSIS — Z79890 Hormone replacement therapy (postmenopausal): Secondary | ICD-10-CM

## 2019-04-10 DIAGNOSIS — Z885 Allergy status to narcotic agent status: Secondary | ICD-10-CM

## 2019-04-10 DIAGNOSIS — M898X9 Other specified disorders of bone, unspecified site: Secondary | ICD-10-CM | POA: Diagnosis present

## 2019-04-10 DIAGNOSIS — Z7902 Long term (current) use of antithrombotics/antiplatelets: Secondary | ICD-10-CM

## 2019-04-10 DIAGNOSIS — Z87891 Personal history of nicotine dependence: Secondary | ICD-10-CM

## 2019-04-10 DIAGNOSIS — I4891 Unspecified atrial fibrillation: Secondary | ICD-10-CM | POA: Diagnosis not present

## 2019-04-10 DIAGNOSIS — I4821 Permanent atrial fibrillation: Secondary | ICD-10-CM | POA: Diagnosis present

## 2019-04-10 DIAGNOSIS — Z7951 Long term (current) use of inhaled steroids: Secondary | ICD-10-CM

## 2019-04-10 DIAGNOSIS — Z820 Family history of epilepsy and other diseases of the nervous system: Secondary | ICD-10-CM

## 2019-04-10 DIAGNOSIS — Z9981 Dependence on supplemental oxygen: Secondary | ICD-10-CM

## 2019-04-10 DIAGNOSIS — J449 Chronic obstructive pulmonary disease, unspecified: Secondary | ICD-10-CM | POA: Diagnosis present

## 2019-04-10 DIAGNOSIS — Z9049 Acquired absence of other specified parts of digestive tract: Secondary | ICD-10-CM

## 2019-04-10 DIAGNOSIS — F419 Anxiety disorder, unspecified: Secondary | ICD-10-CM | POA: Diagnosis present

## 2019-04-10 DIAGNOSIS — A419 Sepsis, unspecified organism: Principal | ICD-10-CM | POA: Diagnosis present

## 2019-04-10 DIAGNOSIS — Z8711 Personal history of peptic ulcer disease: Secondary | ICD-10-CM

## 2019-04-10 DIAGNOSIS — Z8249 Family history of ischemic heart disease and other diseases of the circulatory system: Secondary | ICD-10-CM

## 2019-04-10 DIAGNOSIS — F40240 Claustrophobia: Secondary | ICD-10-CM | POA: Diagnosis present

## 2019-04-10 DIAGNOSIS — Z1159 Encounter for screening for other viral diseases: Secondary | ICD-10-CM | POA: Diagnosis not present

## 2019-04-10 DIAGNOSIS — I9589 Other hypotension: Secondary | ICD-10-CM | POA: Diagnosis present

## 2019-04-10 DIAGNOSIS — Z95810 Presence of automatic (implantable) cardiac defibrillator: Secondary | ICD-10-CM | POA: Diagnosis present

## 2019-04-10 DIAGNOSIS — I70244 Atherosclerosis of native arteries of left leg with ulceration of heel and midfoot: Secondary | ICD-10-CM | POA: Diagnosis not present

## 2019-04-10 DIAGNOSIS — M86172 Other acute osteomyelitis, left ankle and foot: Secondary | ICD-10-CM | POA: Diagnosis present

## 2019-04-10 DIAGNOSIS — Z794 Long term (current) use of insulin: Secondary | ICD-10-CM

## 2019-04-10 DIAGNOSIS — Z7901 Long term (current) use of anticoagulants: Secondary | ICD-10-CM

## 2019-04-10 DIAGNOSIS — Z955 Presence of coronary angioplasty implant and graft: Secondary | ICD-10-CM

## 2019-04-10 DIAGNOSIS — Z961 Presence of intraocular lens: Secondary | ICD-10-CM | POA: Diagnosis present

## 2019-04-10 DIAGNOSIS — Z8673 Personal history of transient ischemic attack (TIA), and cerebral infarction without residual deficits: Secondary | ICD-10-CM

## 2019-04-10 DIAGNOSIS — Z888 Allergy status to other drugs, medicaments and biological substances status: Secondary | ICD-10-CM

## 2019-04-10 DIAGNOSIS — E119 Type 2 diabetes mellitus without complications: Secondary | ICD-10-CM

## 2019-04-10 LAB — CBC WITH DIFFERENTIAL/PLATELET
Abs Immature Granulocytes: 0.07 10*3/uL (ref 0.00–0.07)
Basophils Absolute: 0.1 10*3/uL (ref 0.0–0.1)
Basophils Relative: 1 %
Eosinophils Absolute: 0.5 10*3/uL (ref 0.0–0.5)
Eosinophils Relative: 5 %
HCT: 36.2 % — ABNORMAL LOW (ref 39.0–52.0)
Hemoglobin: 11.3 g/dL — ABNORMAL LOW (ref 13.0–17.0)
Immature Granulocytes: 1 %
Lymphocytes Relative: 10 %
Lymphs Abs: 1 10*3/uL (ref 0.7–4.0)
MCH: 28.6 pg (ref 26.0–34.0)
MCHC: 31.2 g/dL (ref 30.0–36.0)
MCV: 91.6 fL (ref 80.0–100.0)
Monocytes Absolute: 1.2 10*3/uL — ABNORMAL HIGH (ref 0.1–1.0)
Monocytes Relative: 11 %
Neutro Abs: 7.9 10*3/uL — ABNORMAL HIGH (ref 1.7–7.7)
Neutrophils Relative %: 72 %
Platelets: 215 10*3/uL (ref 150–400)
RBC: 3.95 MIL/uL — ABNORMAL LOW (ref 4.22–5.81)
RDW: 16.5 % — ABNORMAL HIGH (ref 11.5–15.5)
WBC: 10.7 10*3/uL — ABNORMAL HIGH (ref 4.0–10.5)
nRBC: 0 % (ref 0.0–0.2)

## 2019-04-10 LAB — COMPREHENSIVE METABOLIC PANEL
ALT: 18 U/L (ref 0–44)
AST: 26 U/L (ref 15–41)
Albumin: 2.9 g/dL — ABNORMAL LOW (ref 3.5–5.0)
Alkaline Phosphatase: 134 U/L — ABNORMAL HIGH (ref 38–126)
Anion gap: 17 — ABNORMAL HIGH (ref 5–15)
BUN: 26 mg/dL — ABNORMAL HIGH (ref 8–23)
CO2: 24 mmol/L (ref 22–32)
Calcium: 9 mg/dL (ref 8.9–10.3)
Chloride: 91 mmol/L — ABNORMAL LOW (ref 98–111)
Creatinine, Ser: 3.72 mg/dL — ABNORMAL HIGH (ref 0.61–1.24)
GFR calc Af Amer: 17 mL/min — ABNORMAL LOW (ref >60)
GFR calc non Af Amer: 14 mL/min — ABNORMAL LOW (ref >60)
Glucose, Bld: 222 mg/dL — ABNORMAL HIGH (ref 70–99)
Potassium: 5 mmol/L (ref 3.5–5.1)
Sodium: 132 mmol/L — ABNORMAL LOW (ref 135–145)
Total Bilirubin: 1.8 mg/dL — ABNORMAL HIGH (ref 0.3–1.2)
Total Protein: 6.5 g/dL (ref 6.5–8.1)

## 2019-04-10 LAB — SARS CORONAVIRUS 2 BY RT PCR (HOSPITAL ORDER, PERFORMED IN ~~LOC~~ HOSPITAL LAB): SARS Coronavirus 2: NEGATIVE

## 2019-04-10 LAB — SEDIMENTATION RATE: Sed Rate: 15 mm/hr (ref 0–16)

## 2019-04-10 LAB — GLUCOSE, CAPILLARY
Glucose-Capillary: 178 mg/dL — ABNORMAL HIGH (ref 70–99)
Glucose-Capillary: 209 mg/dL — ABNORMAL HIGH (ref 70–99)

## 2019-04-10 LAB — MRSA PCR SCREENING: MRSA by PCR: NEGATIVE

## 2019-04-10 LAB — TROPONIN I: Troponin I: 0.63 ng/mL (ref <0.03)

## 2019-04-10 LAB — LACTIC ACID, PLASMA
Lactic Acid, Venous: 2.1 mmol/L (ref 0.5–1.9)
Lactic Acid, Venous: 2.2 mmol/L (ref 0.5–1.9)

## 2019-04-10 MED ORDER — INSULIN ASPART 100 UNIT/ML ~~LOC~~ SOLN
0.0000 [IU] | Freq: Three times a day (TID) | SUBCUTANEOUS | Status: DC
  Administered 2019-04-11 – 2019-04-12 (×3): 3 [IU] via SUBCUTANEOUS
  Administered 2019-04-12: 2 [IU] via SUBCUTANEOUS
  Administered 2019-04-14 – 2019-04-15 (×2): 3 [IU] via SUBCUTANEOUS
  Administered 2019-04-15: 2 [IU] via SUBCUTANEOUS
  Administered 2019-04-15: 3 [IU] via SUBCUTANEOUS
  Administered 2019-04-16: 2 [IU] via SUBCUTANEOUS
  Administered 2019-04-17 – 2019-04-18 (×4): 3 [IU] via SUBCUTANEOUS

## 2019-04-10 MED ORDER — INSULIN ASPART 100 UNIT/ML ~~LOC~~ SOLN
0.0000 [IU] | Freq: Every day | SUBCUTANEOUS | Status: DC
  Administered 2019-04-10: 2 [IU] via SUBCUTANEOUS

## 2019-04-10 MED ORDER — ACETAMINOPHEN 650 MG RE SUPP: 650.0000 mg | Freq: Four times a day (QID) | RECTAL | Status: DC | PRN

## 2019-04-10 MED ORDER — MOMETASONE FURO-FORMOTEROL FUM 200-5 MCG/ACT IN AERO
2.0000 | INHALATION_SPRAY | Freq: Two times a day (BID) | RESPIRATORY_TRACT | Status: DC
  Administered 2019-04-10 – 2019-04-18 (×11): 2 via RESPIRATORY_TRACT
  Filled 2019-04-10: qty 8.8

## 2019-04-10 MED ORDER — METOPROLOL TARTRATE 12.5 MG HALF TABLET
12.5000 mg | ORAL_TABLET | ORAL | Status: DC
  Administered 2019-04-10 – 2019-04-15 (×5): 12.5 mg via ORAL
  Filled 2019-04-10 (×8): qty 1

## 2019-04-10 MED ORDER — SEVELAMER CARBONATE 800 MG PO TABS
2400.0000 mg | ORAL_TABLET | Freq: Three times a day (TID) | ORAL | Status: DC
  Administered 2019-04-10 – 2019-04-18 (×16): 2400 mg via ORAL
  Filled 2019-04-10 (×19): qty 3

## 2019-04-10 MED ORDER — ACETAMINOPHEN 325 MG PO TABS
650.0000 mg | ORAL_TABLET | Freq: Four times a day (QID) | ORAL | Status: DC | PRN
  Administered 2019-04-16 – 2019-04-17 (×3): 650 mg via ORAL
  Filled 2019-04-10 (×4): qty 2

## 2019-04-10 MED ORDER — CINACALCET HCL 30 MG PO TABS
30.0000 mg | ORAL_TABLET | ORAL | Status: DC
  Administered 2019-04-11 – 2019-04-18 (×3): 30 mg via ORAL
  Filled 2019-04-10 (×5): qty 1

## 2019-04-10 MED ORDER — DOXERCALCIFEROL 4 MCG/2ML IV SOLN
1.0000 ug | INTRAVENOUS | Status: DC
  Administered 2019-04-11 – 2019-04-18 (×3): 1 ug via INTRAVENOUS
  Filled 2019-04-10 (×3): qty 2

## 2019-04-10 MED ORDER — LIDOCAINE-PRILOCAINE 2.5-2.5 % EX CREA: 1.0000 "application " | TOPICAL_CREAM | CUTANEOUS | Status: DC

## 2019-04-10 MED ORDER — ONDANSETRON HCL 4 MG PO TABS: 4.0000 mg | ORAL_TABLET | Freq: Four times a day (QID) | ORAL | Status: DC | PRN

## 2019-04-10 MED ORDER — VANCOMYCIN HCL 10 G IV SOLR
2000.0000 mg | Freq: Once | INTRAVENOUS | Status: AC
  Administered 2019-04-10: 2000 mg via INTRAVENOUS
  Filled 2019-04-10: qty 2000

## 2019-04-10 MED ORDER — SODIUM CHLORIDE 0.9% FLUSH
3.0000 mL | Freq: Two times a day (BID) | INTRAVENOUS | Status: DC
  Administered 2019-04-10 – 2019-04-18 (×11): 3 mL via INTRAVENOUS

## 2019-04-10 MED ORDER — MIDODRINE HCL 5 MG PO TABS
10.0000 mg | ORAL_TABLET | ORAL | Status: DC
  Administered 2019-04-11 – 2019-04-13 (×3): 10 mg via ORAL
  Filled 2019-04-10 (×3): qty 2

## 2019-04-10 MED ORDER — ALBUTEROL SULFATE (2.5 MG/3ML) 0.083% IN NEBU
2.5000 mg | INHALATION_SOLUTION | Freq: Four times a day (QID) | RESPIRATORY_TRACT | Status: DC | PRN
  Administered 2019-04-13: 2.5 mg via RESPIRATORY_TRACT
  Filled 2019-04-10: qty 3

## 2019-04-10 MED ORDER — SODIUM CHLORIDE 0.9 % IV BOLUS
500.0000 mL | Freq: Once | INTRAVENOUS | Status: AC
  Administered 2019-04-10: 500 mL via INTRAVENOUS

## 2019-04-10 MED ORDER — VANCOMYCIN HCL IN DEXTROSE 1-5 GM/200ML-% IV SOLN
1000.0000 mg | INTRAVENOUS | Status: DC
  Administered 2019-04-11: 20:00:00 1000 mg via INTRAVENOUS
  Filled 2019-04-10 (×2): qty 200

## 2019-04-10 MED ORDER — HYDROXYZINE HCL 25 MG PO TABS
12.5000 mg | ORAL_TABLET | Freq: Two times a day (BID) | ORAL | Status: DC | PRN
  Administered 2019-04-11 (×2): 25 mg via ORAL
  Filled 2019-04-10 (×3): qty 1

## 2019-04-10 MED ORDER — PANTOPRAZOLE SODIUM 40 MG PO TBEC
40.0000 mg | DELAYED_RELEASE_TABLET | Freq: Every evening | ORAL | Status: DC
  Administered 2019-04-10 – 2019-04-18 (×7): 40 mg via ORAL
  Filled 2019-04-10 (×8): qty 1

## 2019-04-10 MED ORDER — GUAIFENESIN ER 600 MG PO TB12
600.0000 mg | ORAL_TABLET | Freq: Two times a day (BID) | ORAL | Status: DC | PRN
  Administered 2019-04-14: 600 mg via ORAL
  Filled 2019-04-10: qty 1

## 2019-04-10 MED ORDER — HYDROCODONE-ACETAMINOPHEN 5-325 MG PO TABS
1.0000 | ORAL_TABLET | Freq: Four times a day (QID) | ORAL | Status: DC | PRN
  Administered 2019-04-10 – 2019-04-12 (×5): 1 via ORAL
  Filled 2019-04-10 (×5): qty 1

## 2019-04-10 MED ORDER — MORPHINE SULFATE (PF) 4 MG/ML IV SOLN
4.0000 mg | Freq: Once | INTRAVENOUS | Status: AC
  Administered 2019-04-10: 4 mg via INTRAVENOUS
  Filled 2019-04-10: qty 1

## 2019-04-10 MED ORDER — ONDANSETRON HCL 4 MG/2ML IJ SOLN
4.0000 mg | Freq: Once | INTRAMUSCULAR | Status: AC
  Administered 2019-04-10: 4 mg via INTRAVENOUS
  Filled 2019-04-10: qty 2

## 2019-04-10 MED ORDER — MELATONIN 3 MG PO TABS
6.0000 mg | ORAL_TABLET | Freq: Every evening | ORAL | Status: DC | PRN
  Filled 2019-04-10: qty 3

## 2019-04-10 MED ORDER — MIDODRINE HCL 5 MG PO TABS
2.5000 mg | ORAL_TABLET | Freq: Three times a day (TID) | ORAL | Status: DC
  Administered 2019-04-10 – 2019-04-18 (×21): 2.5 mg via ORAL
  Filled 2019-04-10 (×22): qty 1

## 2019-04-10 MED ORDER — ATORVASTATIN CALCIUM 80 MG PO TABS
80.0000 mg | ORAL_TABLET | Freq: Every evening | ORAL | Status: DC
  Administered 2019-04-10 – 2019-04-18 (×7): 80 mg via ORAL
  Filled 2019-04-10 (×8): qty 1

## 2019-04-10 MED ORDER — SODIUM CHLORIDE 0.9 % IV SOLN
1.0000 g | Freq: Once | INTRAVENOUS | Status: AC
  Administered 2019-04-10: 1 g via INTRAVENOUS
  Filled 2019-04-10: qty 1

## 2019-04-10 MED ORDER — ONDANSETRON HCL 4 MG/2ML IJ SOLN: 4.0000 mg | Freq: Four times a day (QID) | INTRAMUSCULAR | Status: DC | PRN

## 2019-04-10 MED ORDER — LORATADINE 10 MG PO TABS: 10.0000 mg | ORAL_TABLET | Freq: Every day | ORAL | Status: DC | PRN

## 2019-04-10 MED ORDER — SODIUM CHLORIDE 0.9 % IV SOLN
1.0000 g | INTRAVENOUS | Status: DC
  Administered 2019-04-11 – 2019-04-14 (×4): 1 g via INTRAVENOUS
  Filled 2019-04-10 (×5): qty 1

## 2019-04-10 MED ORDER — ALPRAZOLAM 0.25 MG PO TABS
0.2500 mg | ORAL_TABLET | Freq: Two times a day (BID) | ORAL | Status: DC | PRN
  Administered 2019-04-11 – 2019-04-17 (×4): 0.25 mg via ORAL
  Filled 2019-04-10 (×5): qty 1

## 2019-04-10 NOTE — ED Notes (Signed)
Previously RN started antibiotics prior to getting second set of cultures.

## 2019-04-10 NOTE — ED Notes (Signed)
ED TO INPATIENT HANDOFF REPORT  ED Nurse Name and Phone #: Jorene Guest [phone]  S Name/Age/Gender Jeffrey [name] 78 y.o. male Room/Bed: 026C/026C  Code Status   Code Status: Full Code  Home/SNF/Other Home Patient oriented to: self, place, time and situation Is this baseline? Yes   Triage Complete: Triage complete  Chief Complaint L Foot Gangrene  Triage Note Pt is dialysis pt who has left foot wound with eschar and wet gangrene. Pic in chart. Pt had dialysis yesterday   Allergies Allergies  Allergen Reactions  . Byetta 10 Mcg Pen [Exenatide] Diarrhea and Nausea And Vomiting  . Codeine Itching  . Coumadin [Warfarin Sodium] Rash    (wife states coumadin was stopped but rash did not disappear 02/21/19)    Level of Care/Admitting Diagnosis ED Disposition    ED Disposition Condition [facility] Area: [place] [[id number]]  Level of Care: Telemetry Medical [104]  Covid Evaluation: Screening Protocol (No Symptoms)  Diagnosis: Acute osteomyelitis of left foot [facility]) [[id number]]  Admitting Physician: Norval Morton [[id number]]  Attending Physician: Norval Morton [[id number]]  Estimated length of stay: past midnight tomorrow  Certification:: I certify this patient will need inpatient services for at least 2 midnights  PT Class (Do Not Modify): Inpatient [101]  PT Acc Code (Do Not Modify): Private [1]       B Medical/Surgery History Past Medical History:  Diagnosis Date  . Anginal pain ([place])   . Aortic stenosis    a. severe by echo 09/2014  . Atrial fibrillation ([place])    a. not well documented, not on anticoagulation  . CHF (congestive heart failure) ([place])    04/28/17 echo-EF 40%, mod LVH, diastolic dysfunction  . Claustrophobia   . Complete heart block ([place])   . COPD (chronic obstructive pulmonary disease) ([place])   . Coronary artery disease    a. chronically occluded RCA per cath 09/2014 with collaterals B. cath 05/01/17 chr occ RCA  w/collaterals, 60-70% mid LAD,   . CVA (cerebral vascular accident) ([place]) 10/2014   denies residual on 07/11/2015  . ESRD (end stage renal disease) on dialysis [facility])    a. on dialysis; [place]; MWF, LUE fistula (07/11/2015)  . History of blood transfusion    "related to gallbladder OR"  . History of stomach ulcers   . Hyperlipidemia   . Hypertension   . Iron deficiency anemia   . Myocardial infarction ([place]) 10/2014  . Peripheral vascular disease ([place])   . Pneumonia   . Presence of permanent cardiac pacemaker   . 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 Type II diabetes mellitus ([place])    Past Surgical History:  Procedure Laterality Date  . ABDOMINAL AORTOGRAM W/LOWER EXTREMITY Bilateral 03/27/2019   Procedure: ABDOMINAL AORTOGRAM W/LOWER EXTREMITY;  Surgeon: Angelia Mould, MD;  Location: [place] [facility];  Service: Cardiovascular;  Laterality: Bilateral;  . AV FISTULA PLACEMENT Left 10/19/2014   Procedure: BRACHIOCEPHALIC ARTERIOVENOUS (AV) FISTULA CREATION ;  Surgeon: Conrad Ponshewaing, MD;  Location: [place];  Service: Vascular;  Laterality: Left;  . CARDIAC CATHETERIZATION    . CARDIAC CATHETERIZATION N/A 07/22/2015   Procedure: Right/Left Heart Cath and Coronary Angiography;  Surgeon: [name], MD;  Location: [place] [facility];  Service: Cardiovascular;  Laterality: N/A;  . CATARACT EXTRACTION W/ INTRAOCULAR LENS  IMPLANT, BILATERAL Bilateral 1990's  .  CHOLECYSTECTOMY OPEN  1980's  . COLONOSCOPY W/ BIOPSIES AND POLYPECTOMY    . CORONARY ANGIOGRAPHY N/A 07/31/2018   Procedure: CORONARY ANGIOGRAPHY;  Surgeon: Martinique, Peter M, MD;  Location: [place] [facility];  Service: Cardiovascular;  Laterality: N/A;  . CORONARY ANGIOPLASTY    . CORONARY ATHERECTOMY N/A 04/18/2018   Procedure: CORONARY ATHERECTOMY;  Surgeon: [name], MD;   Location: [place] [facility];  Service: Cardiovascular;  Laterality: N/A;  . CORONARY BALLOON ANGIOPLASTY N/A 07/31/2018   Procedure: CORONARY BALLOON ANGIOPLASTY;  Surgeon: Martinique, Peter M, MD;  Location: [place] [facility];  Service: Cardiovascular;  Laterality: N/A;  . CORONARY STENT INTERVENTION N/A 04/18/2018   Procedure: CORONARY STENT INTERVENTION;  Surgeon: [name], MD;  Location: [place] [facility];  Service: Cardiovascular;  Laterality: N/A;  . EP IMPLANTABLE DEVICE N/A 07/11/2015   Procedure: Pacemaker Implant;  Surgeon: [name], MD;  Location: [place] [facility];  Service: Cardiovascular;  Laterality: N/A;  . ESOPHAGOGASTRODUODENOSCOPY  08/01/2012   Procedure: ESOPHAGOGASTRODUODENOSCOPY (EGD);  Surgeon: [name], MD;  Location: [facility];  Service: Endoscopy;  Laterality: N/A;  . INSERT / REPLACE / REMOVE PACEMAKER  07/11/2015  . INSERTION OF DIALYSIS CATHETER Right 02/02/2015   Procedure: INSERTION OF DIALYSIS CATHETER  RIGHT INTERNAL JUGULAR;  Surgeon: Mal [name], MD;  Location: [place];  Service: Vascular;  Laterality: Right;  . LEFT AND RIGHT HEART CATHETERIZATION WITH CORONARY ANGIOGRAM N/A 09/30/2014   Procedure: LEFT AND RIGHT HEART CATHETERIZATION WITH CORONARY ANGIOGRAM;  Surgeon: [name], MD;  Location: [facility] CATH LAB;  Service: Cardiovascular;  Laterality: N/A;  . LEFT HEART CATH AND CORONARY ANGIOGRAPHY N/A 04/14/2018   Procedure: LEFT HEART CATH AND CORONARY ANGIOGRAPHY;  Surgeon: [name], MD;  Location: [place] [facility];  Service: Cardiovascular;  Laterality: N/A;  . TEE WITHOUT CARDIOVERSION N/A 08/02/2015   Procedure: TRANSESOPHAGEAL ECHOCARDIOGRAM (TEE);  Surgeon: [name], MD;  Location: [place];  Service: Open Heart Surgery;  Laterality: N/A;  . TONSILLECTOMY    . TRANSCATHETER AORTIC VALVE REPLACEMENT, TRANSFEMORAL Left 08/02/2015   Procedure: TRANSCATHETER AORTIC VALVE REPLACEMENT, TRANSFEMORAL;  Surgeon: [name], MD;   Location: [place];  Service: Open Heart Surgery;  Laterality: Left;     A IV Location/Drains/Wounds Patient Lines/Drains/Airways Status   Active Line/Drains/Airways    Name:   Placement date:   Placement time:   Site:   Days:   Peripheral IV 02/20/19 Right;Upper Forearm   02/20/19    0149    Forearm   49   Peripheral IV 04/10/19 Right Forearm   04/10/19    1316    Forearm   less than 1   Peripheral IV 04/10/19 Right Hand   04/10/19    1530    Hand   less than 1   Fistula / Graft Left Upper arm Arteriovenous fistula   -    -    Upper arm             Intake/Output Last 24 hours No intake or output data in the 24 hours ending 04/10/19 1609  Labs/Imaging Results for orders placed or performed during the hospital encounter of 04/10/19 (from the past 48 hour(s))  SARS Coronavirus 2 (CEPHEID - Performed in [place] hospital lab), Hosp Order     Status: None   Collection Time: 04/10/19 12:56 PM  Result Value Ref Range   SARS Coronavirus 2 NEGATIVE NEGATIVE    Comment: (NOTE)  If result is NEGATIVE SARS-CoV-2 target nucleic acids are NOT DETECTED. The SARS-CoV-2 RNA is generally detectable in upper and lower  respiratory specimens during the acute phase of infection. The lowest  concentration of SARS-CoV-2 viral copies this assay can detect is 250  copies / mL. A negative result does not preclude SARS-CoV-2 infection  and should not be used as the sole basis for treatment or other  patient management decisions.  A negative result may occur with  improper specimen collection / handling, submission of specimen other  than nasopharyngeal swab, presence of viral mutation(s) within the  areas targeted by this assay, and inadequate number of viral copies  (<250 copies / mL). A negative result must be combined with clinical  observations, patient history, and epidemiological information. If result is POSITIVE SARS-CoV-2 target nucleic acids are DETECTED. The SARS-CoV-2 RNA is generally  detectable in upper and lower  respiratory specimens dur ing the acute phase of infection.  Positive  results are indicative of active infection with SARS-CoV-2.  Clinical  correlation with patient history and other diagnostic information is  necessary to determine patient infection status.  Positive results do  not rule out bacterial infection or co-infection with other viruses. If result is PRESUMPTIVE POSTIVE SARS-CoV-2 nucleic acids MAY BE PRESENT.   A presumptive positive result was obtained on the submitted specimen  and confirmed on repeat testing.  While 2019 novel coronavirus  (SARS-CoV-2) nucleic acids may be present in the submitted sample  additional confirmatory testing may be necessary for epidemiological  and / or clinical management purposes  to differentiate between  SARS-CoV-2 and other Sarbecovirus currently known to infect humans.  If clinically indicated additional testing with an alternate test  methodology [phone]) is advised. The SARS-CoV-2 RNA is generally  detectable in upper and lower respiratory sp ecimens during the acute  phase of infection. The expected result is Negative. Fact Sheet for Patients:  StrictlyIdeas.no Fact Sheet for Healthcare Providers: BankingDealers.co.za This test is not yet approved or cleared by the [place] FDA and has been authorized for detection and/or diagnosis of SARS-CoV-2 by FDA under an Emergency Use Authorization (EUA).  This EUA will remain in effect (meaning this test can be used) for the duration of the COVID-19 declaration under Section 564(b)(1) of the Act, 21 U.S.C. section 360bbb-3(b)(1), unless the authorization is terminated or revoked sooner. Performed at [facility], [place] [address] [place], [place] [address]   Comprehensive metabolic panel     Status: Abnormal   Collection Time: 04/10/19  1:05 PM  Result Value Ref Range   Sodium 132 (L) 135 - 145  mmol/L   Potassium 5.0 3.5 - 5.1 mmol/L   Chloride 91 (L) 98 - 111 mmol/L   CO2 24 22 - 32 mmol/L   Glucose, Bld 222 (H) 70 - 99 mg/dL   BUN 26 (H) 8 - 23 mg/dL   Creatinine, Ser 3.72 (H) 0.61 - 1.24 mg/dL   Calcium 9.0 8.9 - 10.3 mg/dL   Total Protein 6.5 6.5 - 8.1 g/dL   Albumin 2.9 (L) 3.5 - 5.0 g/dL   AST 26 15 - 41 U/L   ALT 18 0 - 44 U/L   Alkaline Phosphatase 134 (H) 38 - 126 U/L   Total Bilirubin 1.8 (H) 0.3 - 1.2 mg/dL   GFR calc non Af Amer 14 (L) >60 mL/min   GFR calc Af Amer 17 (L) >60 mL/min   Anion gap 17 (H) 5 - 15  Comment: Performed at [facility], [place] [address] [place], [place] [address]  Troponin I - Once     Status: Abnormal   Collection Time: 04/10/19  1:05 PM  Result Value Ref Range   Troponin I 0.63 (HH) <0.03 ng/mL    Comment: CRITICAL RESULT CALLED TO, READ BACK BY AND VERIFIED WITHElzie Rings RN [phone] BY A BENNETT Performed at [facility] [address], [place], [place] [address]   CBC with Differential/Platelet     Status: Abnormal   Collection Time: 04/10/19  2:00 PM  Result Value Ref Range   WBC 10.7 (H) 4.0 - 10.5 K/uL   RBC 3.95 (L) 4.22 - 5.81 MIL/uL   Hemoglobin 11.3 (L) 13.0 - 17.0 g/dL   HCT 36.2 (L) 39.0 - 52.0 %   MCV 91.6 80.0 - 100.0 fL   MCH 28.6 26.0 - 34.0 pg   MCHC 31.2 30.0 - 36.0 g/dL   RDW 16.5 (H) 11.5 - 15.5 %   Platelets 215 150 - 400 K/uL   nRBC 0.0 0.0 - 0.2 %   Neutrophils Relative % 72 %   Neutro Abs 7.9 (H) 1.7 - 7.7 K/uL   Lymphocytes Relative 10 %   Lymphs Abs 1.0 0.7 - 4.0 K/uL   Monocytes Relative 11 %   Monocytes Absolute 1.2 (H) 0.1 - 1.0 K/uL   Eosinophils Relative 5 %   Eosinophils Absolute 0.5 0.0 - 0.5 K/uL   Basophils Relative 1 %   Basophils Absolute 0.1 0.0 - 0.1 K/uL   Immature Granulocytes 1 %   Abs Immature Granulocytes 0.07 0.00 - 0.07 K/uL    Comment: Performed at [facility], [place] [address] [place], [place] [address]  Lactic acid, plasma     Status: Abnormal    Collection Time: 04/10/19  2:30 PM  Result Value Ref Range   Lactic Acid, Venous 2.1 (HH) 0.5 - 1.9 mmol/L    Comment: CRITICAL RESULT CALLED TO, READ BACK BY AND VERIFIED WITH: E PRYOR,RN 1507 04/10/2019 D BRADLEY Performed at [facility], [place] [address], [place], [name] [address]    Dg Chest 2 View  Result Date: 04/10/2019 CLINICAL DATA:  Black toes, pain, shortness of breath EXAM: CHEST - 2 VIEW COMPARISON:  02/20/2019 FINDINGS: Cardiomegaly status post aortic valve stent endograft repair. Right chest multi lead pacer. There is minimal, diffuse interstitial pulmonary opacity bilaterally without focal airspace abnormality. The visualized skeletal structures are unremarkable. IMPRESSION: Cardiomegaly with minimal, diffuse interstitial pulmonary opacity, which may reflect minimal edema or chronic interstitial change. There is no focal airspace opacity. Electronically Signed   By: Eddie Candle M.D.   On: 04/10/2019 14:17   Dg Foot Complete Left  Result Date: 04/10/2019 CLINICAL DATA:  Initial evaluation for acute pain, osteomyelitis. EXAM: LEFT FOOT - COMPLETE 3+ VIEW COMPARISON:  None. FINDINGS: No acute fracture or dislocation. Scattered osteoarthritic changes present throughout the midfoot. Subtle progressive osseous erosion at the right fourth distal phalanx as compared to previous exam, suspicious for possible osteomyelitis. Soft tissue ulceration overlies the great toe. Subtle erosive changes at the underlying right first metatarsal head also suspicious for possible osteomyelitis. No other definite osseous erosions. Calcified atherosclerosis. IMPRESSION: 1. Subtle progressive osseous erosion at the right fourth distal phalanx as compared to previous exam, suspicious for osteomyelitis. 2. Soft tissue ulceration overlying the right great toe. Erosive changes involving the underlying right first metatarsal head also suspicious for osteomyelitis. 3. Extensive  atherosclerosis. Electronically  Signed   By: Jeannine Boga M.D.   On: 04/10/2019 14:20    Pending Labs Unresulted Labs (From admission, onward)    Start     Ordered   04/11/19 0500  Hemoglobin A1c  Tomorrow morning,   R     04/10/19 1552   04/11/19 0500  Prealbumin  Tomorrow morning,   R     04/10/19 1552   04/11/19 0500  CBC  Tomorrow morning,   R     04/10/19 1552   04/11/19 9147  Basic metabolic panel  Tomorrow morning,   R     04/10/19 1552   04/10/19 1551  Sedimentation rate  Add-on,   R     04/10/19 1552   04/10/19 1256  CBC with Differential  Once,   STAT     04/10/19 1255   04/10/19 1256  Culture, blood (routine x 2)  BLOOD CULTURE X 2,   STAT     04/10/19 1255   04/10/19 1256  Lactic acid, plasma  Now then every 2 hours,   STAT     04/10/19 1255          Vitals/Pain Today's Vitals   04/10/19 1419 04/10/19 1456 04/10/19 1500 04/10/19 1504  BP: (!) 147/129 (!) 106/46 (!) 95/49 (!) 93/48  Pulse: (!) 58 (!) 52 (!) 55 (!) 55  Resp: 20 20 14 16   Temp:      TempSrc:      SpO2: 100% 100% 97% 100%  Weight: 98 kg     PainSc:        Isolation Precautions No active isolations  Medications Medications  vancomycin (VANCOCIN) 2,000 mg in sodium chloride 0.9 % 500 mL IVPB (2,000 mg Intravenous New Bag/Given 04/10/19 1533)  vancomycin (VANCOCIN) IVPB 1000 mg/200 mL premix (has no administration in time range)  atorvastatin (LIPITOR) tablet 80 mg (has no administration in time range)  metoprolol tartrate (LOPRESSOR) tablet 12.5 mg (has no administration in time range)  midodrine (PROAMATINE) tablet 10 mg (has no administration in time range)  midodrine (PROAMATINE) tablet 2.5 mg (has no administration in time range)  ALPRAZolam (XANAX) tablet 0.25 mg (has no administration in time range)  hydrOXYzine (ATARAX/VISTARIL) tablet 12.5-25 mg (has no administration in time range)  doxercalciferol (HECTOROL) injection 1 mcg (has no administration in time range)  cinacalcet (SENSIPAR) tablet 30 mg (has no  administration in time range)  pantoprazole (PROTONIX) EC tablet 40 mg (has no administration in time range)  sevelamer carbonate (RENVELA) tablet 1,600-2,400 mg (has no administration in time range)  Melatonin TABS 5-10 mg (has no administration in time range)  guaiFENesin (MUCINEX) 12 hr tablet 600 mg (has no administration in time range)  loratadine (CLARITIN) tablet 10 mg (has no administration in time range)  lidocaine-prilocaine (EMLA) cream 1 application (has no administration in time range)  albuterol (PROVENTIL) (2.5 MG/3ML) 0.083% nebulizer solution 2.5 mg (has no administration in time range)  mometasone-formoterol (DULERA) 200-5 MCG/ACT inhaler 2 puff (has no administration in time range)  sodium chloride flush (NS) 0.9 % injection 3 mL (has no administration in time range)  acetaminophen (TYLENOL) tablet 650 mg (has no administration in time range)    Or  acetaminophen (TYLENOL) suppository 650 mg (has no administration in time range)  ondansetron (ZOFRAN) tablet 4 mg (has no administration in time range)    Or  ondansetron (ZOFRAN) injection 4 mg (has no administration in time range)  sodium chloride 0.9 % bolus 500  mL (0 mLs Intravenous Stopped 04/10/19 1451)  morphine 4 MG/ML injection 4 mg (4 mg Intravenous Given 04/10/19 1419)  ondansetron (ZOFRAN) injection 4 mg (4 mg Intravenous Given 04/10/19 1419)  cefTAZidime (FORTAZ) 1 g in sodium chloride 0.9 % 100 mL IVPB (0 g Intravenous Stopped 04/10/19 1511)    Mobility non-ambulatory High fall risk   Focused Assessments Renal Assessment Handoff:  Hemodialysis Schedule: Hemodialysis Schedule: Tuesday/Thursday/Saturday  Last Hemodialysis date and time: Yesterday    Restricted appendage: left arm     R Recommendations: See Admitting Provider Note  Report given to:   Additional Notes: patient reports being unable to walk at home.  He has two people assist him from bed to chair.

## 2019-04-10 NOTE — ED Notes (Signed)
Nurse Navigator communication: Pts wife [name] given an update on admission and room number for the patient.

## 2019-04-10 NOTE — ED Provider Notes (Addendum)
[facility] EMERGENCY DEPARTMENT Provider Note   CSN: [id number] Arrival date & time: 04/10/19  1241    History   Chief Complaint Chief Complaint  Patient presents with   Foot Injury    HPI KRISTJAN DERNER is a 78 y.o. male.     Pt presents to the ED today with left foot pain and redness.  The pt has had problems with his left foot ever since he developed a blister on his foot back in March.  He has PVD and is followed by Dr. Scot Dock.  He had an aortogram with bilateral iliac arteriogram done by Dr. Scot Dock on 5/1.  He had diffuse disease throughout his vessels, but they were patent.  Dr. Scot Dock felt there was reasonable flow to the foot with no good options for revascularization.  He recommended aggressive wound care.  The pt's wife has been cleaning pt's food.  Pt feels like it is getting worse.  Pt is a dialysis patient (Tu,Th,Sat) and has been compliant with dialysis.     Past Medical History:  Diagnosis Date   Anginal pain ([place])    Aortic stenosis    a. severe by echo 09/2014   Atrial fibrillation ([place])    a. not well documented, not on anticoagulation   CHF (congestive heart failure) ([place])    04/28/17 echo-EF 40%, mod LVH, diastolic dysfunction   Claustrophobia    Complete heart block (HCC)    COPD (chronic obstructive pulmonary disease) ([place])    Coronary artery disease    a. chronically occluded RCA per cath 09/2014 with collaterals B. 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   29 mm Edwards Sapien 3 transcatheter heart valve placed via open left transfemoral approach   Type II diabetes mellitus ([place])     Patient Active Problem List   Diagnosis Date Noted   Cellulitis in diabetic foot ([place]) 02/24/2019   Chronic anticoagulation 02/24/2019   Venous stasis 02/24/2019   Hypotension 02/24/2019   Discoloration of skin of foot 02/20/2019   Discoloration of skin of multiple sites of lower extremity 02/20/2019   RSV (respiratory syncytial virus infection) 11/30/2018   Troponin level elevated    COPD exacerbation ([place]) 11/29/2018   Chronic respiratory failure with hypoxia ([place]) 10/29/2018   Respiratory distress 07/27/2018   GERD (gastroesophageal reflux disease) 06/23/2018   Cough 06/23/2018   Chest pain 04/15/2018   NSTEMI (non-ST elevated myocardial infarction) ([place]) 04/14/2018   Rash 04/14/2018   COPD GOLD 0 09/26/2017   Atrial fibrillation ([place]) [I48.91] 05/21/2017   Encounter for therapeutic drug monitoring 05/21/2017   Obesity (BMI 30-39.9) 04/17/2017   Hyponatremia 03/10/2017   COPD with acute exacerbation ([place]) 03/10/2017   Non-healing open wound of left groin 09/05/2015   Automatic implantable cardioverter-defibrillator in situ 08/08/2015   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   Hypervolemia  Chronic combined systolic and diastolic CHF (congestive heart failure) ([place]) 07/19/2015   Fluid overload 07/18/2015   Acute on chronic respiratory failure with hypoxia (HCC) 07/18/2015   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/2015   Atherosclerosis of native coronary artery of native heart with angina pectoris (HCC)    Protein-calorie malnutrition, severe ([place]) 01/14/2015   ESRD (end stage renal disease) on dialysis ([place]) 01/12/2015   Mobitz type 1 second degree AV block     Elevated troponin 09/30/2014   Diabetes type 2, controlled ([place])    History of stroke 09/29/2014   Type 2 diabetes mellitus with hypoglycemia without coma ([place]) 09/29/2014   CAD (coronary artery disease) 09/29/2014   Essential hypertension    History of tobacco use 11/21/2012   Hyperlipidemia 01/19/2010   Arthritis, degenerative 01/19/2010   Allergic rhinitis 12/05/2009   Idiopathic peripheral neuropathy 01/05/2009   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   Anemia of chronic disease 11/13/2007    Past Surgical History:  Procedure Laterality Date   ABDOMINAL AORTOGRAM W/LOWER EXTREMITY Bilateral 03/27/2019   Procedure: ABDOMINAL AORTOGRAM W/LOWER EXTREMITY;  Surgeon: Angelia Mould, MD;  Location: [place] CV LAB;  Service: Cardiovascular;  Laterality: Bilateral;   AV FISTULA PLACEMENT Left 10/19/2014   Procedure: BRACHIOCEPHALIC ARTERIOVENOUS (AV) FISTULA CREATION ;  Surgeon: Conrad [place], MD;  Location: [place];  Service: Vascular;  Laterality: Left;   CARDIAC CATHETERIZATION     CARDIAC CATHETERIZATION N/A 07/22/2015   Procedure: Right/Left Heart Cath and Coronary Angiography;  Surgeon: [name], MD;  Location: [place] [facility];  Service: Cardiovascular;  Laterality: N/A;   CATARACT EXTRACTION W/ INTRAOCULAR LENS  IMPLANT, BILATERAL Bilateral 1990's   CHOLECYSTECTOMY OPEN  1980's   COLONOSCOPY W/ BIOPSIES AND POLYPECTOMY     CORONARY ANGIOGRAPHY N/A 07/31/2018   Procedure: CORONARY ANGIOGRAPHY;  Surgeon: Martinique, Peter M, MD;  Location: [place] CV LAB;  Service: Cardiovascular;  Laterality: N/A;   CORONARY ANGIOPLASTY     CORONARY ATHERECTOMY N/A 04/18/2018   Procedure: CORONARY ATHERECTOMY;  Surgeon: [name], MD;  Location: [place] CV LAB;  Service: Cardiovascular;  Laterality: N/A;   CORONARY BALLOON ANGIOPLASTY N/A 07/31/2018   Procedure: CORONARY BALLOON ANGIOPLASTY;  Surgeon: Martinique, Peter M, MD;   Location: [place] CV LAB;  Service: Cardiovascular;  Laterality: N/A;   CORONARY STENT INTERVENTION N/A 04/18/2018   Procedure: CORONARY STENT INTERVENTION;  Surgeon: [name], MD;  Location: [place] CV LAB;  Service: Cardiovascular;  Laterality: N/A;   EP IMPLANTABLE DEVICE N/A 07/11/2015   Procedure: Pacemaker Implant;  Surgeon: [name], MD;  Location: [place] [facility];  Service: Cardiovascular;  Laterality: N/A;   ESOPHAGOGASTRODUODENOSCOPY  08/01/2012   Procedure: ESOPHAGOGASTRODUODENOSCOPY (EGD);  Surgeon: [name], MD;  Location: [facility];  Service: Endoscopy;  Laterality: N/A;   INSERT / REPLACE / REMOVE PACEMAKER  07/11/2015   INSERTION OF DIALYSIS CATHETER Right 02/02/2015   Procedure: INSERTION OF DIALYSIS CATHETER  RIGHT INTERNAL JUGULAR;  Surgeon: Mal [name], MD;  Location: [place];  Service: Vascular;  Laterality: Right;   LEFT AND RIGHT HEART CATHETERIZATION WITH CORONARY ANGIOGRAM N/A 09/30/2014   Procedure: LEFT AND RIGHT HEART CATHETERIZATION WITH CORONARY ANGIOGRAM;  Surgeon: [name], MD;  Location: [facility] C[facility] LAB;  Service: Cardiovascular;  Laterality: N/A;   LEFT HEART CATH AND CORONARY ANGIOGRAPHY N/A 04/14/2018   Procedure: LEFT HEART CATH AND CORONARY ANGIOGRAPHY;  Surgeon: [name], MD;  Location: [place] [facility];  Service: Cardiovascular;  Laterality: N/A;   TEE WITHOUT CARDIOVERSION N/A 08/02/2015   Procedure: TRANSESOPHAGEAL ECHOCARDIOGRAM (TEE);  Surgeon: [name], MD;  Location: [place];  Service: Open Heart Surgery;  Laterality: N/A;   TONSILLECTOMY     TRANSCATHETER AORTIC VALVE REPLACEMENT, TRANSFEMORAL Left 08/02/2015   Procedure: TRANSCATHETER AORTIC VALVE REPLACEMENT, TRANSFEMORAL;  Surgeon: [name], MD;  Location: [place];  Service: Open Heart Surgery;  Laterality: Left;        Home Medications    Prior to Admission medications   Medication Sig Start Date End Date Taking? Authorizing  Provider  acetaminophen (TYLENOL) 500 MG tablet Take 500 mg by mouth every 6 (six) hours as needed for headache (pain).    [provider]  albuterol (PROVENTIL HFA;VENTOLIN HFA) 108 (90 Base) MCG/ACT inhaler Inhale 2 puffs into the lungs every 4 (four) hours as needed for wheezing or shortness of breath. 11/27/18   Tanda Rockers, MD  albuterol (PROVENTIL) (2.5 MG/3ML) 0.083% nebulizer solution Take 3 mLs (2.5 mg total) by nebulization every 6 (six) hours as needed for wheezing or shortness of breath. 01/07/19   Scot Jun, FNP  ALPRAZolam Duanne Moron) 0.25 MG tablet Take 1 tablet (0.25 mg total) by mouth 2 (two) times daily as needed for anxiety. 02/24/19   Norval Morton, MD  apixaban (ELIQUIS) 2.5 MG TABS tablet Take 1 tablet (2.5 mg total) by mouth 2 (two) times daily. 02/24/19   Norval Morton, MD  atorvastatin (LIPITOR) 80 MG tablet Take 1 tablet (80 mg total) by mouth daily at 6 PM. Patient taking differently: Take 80 mg by mouth every evening.  01/07/19   Scot Jun, FNP  budesonide-formoterol [facility]) 160-4.5 MCG/ACT inhaler Inhale 2 puffs into the lungs 2 (two) times daily. Patient taking differently: Inhale 2 puffs into the lungs 2 (two) times daily as needed (asthma).  11/27/18   Tanda Rockers, MD  cinacalcet (SENSIPAR) 30 MG tablet Take 30 mg by mouth See admin instructions. Take one tablet (30 mg) by mouth on Monday, Tuesday, Thursday, Saturday after dialysis    [provider]  clindamycin (CLEOCIN) 300 MG capsule Take 1 capsule (300 mg total) by mouth 3 (three) times daily. Patient not taking: Reported on 03/26/2019 02/24/19   Norval Morton, MD  clopidogrel (PLAVIX) 75 MG tablet Take 1 tablet (75 mg total) by mouth daily. Patient taking differently: Take 75 mg by mouth every evening.  09/02/18   Martinique, Peter M, MD  doxercalciferol (HECTOROL) 4 MCG/2ML injection Inject 0.5 mLs (1 mcg total) into the vein Every Tuesday,Thursday,and Saturday with  dialysis. Patient taking differently: Inject 1 mcg into the vein See admin instructions. At dialysis 02/26/19   Norval Morton, MD  guaiFENesin (MUCINEX) 600 MG 12 hr tablet Take 1 tablet (600 mg total) by mouth 2 (two) times daily. Patient taking differently: Take 600 mg by mouth 2 (two) times daily as needed for cough or to loosen phlegm.  01/07/19   Scot Jun, FNP  hydrOXYzine (ATARAX/VISTARIL) 25 MG tablet Take 0.5-1 tablets (12.5-25 mg total) by mouth 2 (two) times daily as needed for itching. 01/07/19   Scot Jun, FNP  insulin detemir (LEVEMIR) 100 UNIT/ML injection Inject 0.28 mLs (28 Units total) into the skin at bedtime. 02/24/19   Fuller Plan A, MD  Insulin Pen Needle 31G X 6 MM MISC at bedtime.  08/28/18   [provider]  lidocaine-prilocaine (EMLA) cream Apply  1 application topically See admin instructions. Apply topically prior to dialysis (Monday, Tuesday, Thursday, Saturday)    [provider]  loratadine (CLARITIN) 10 MG tablet Take 10 mg by mouth daily as needed for allergies.     [provider]  Melatonin 5 MG TABS Take 5-10 mg by mouth at bedtime as needed (sleep).     [provider]  metoprolol tartrate (LOPRESSOR) 25 MG tablet Take 0.5 tablets (12.5 mg total) by mouth See admin instructions. Take 1/2 tablet (12.5 mg) by mouth on Sunday, Monday, Wednesday, Friday mornings (non-dialysis days), take 1/2 tablet (12.5 mg) every evening Patient taking differently: Take 12.5 mg by mouth See admin instructions. Take 1/2 tablet (12.5 mg) by mouth on Sunday, Wednesday, Friday mornings (non-dialysis days), take 1/2 tablet (12.5 mg) every evening 03/04/19   Scot Jun, FNP  midodrine (PROAMATINE) 10 MG tablet Take 10 mg by mouth See admin instructions. Take one tablet (10 mg) by mouth 30 minutes before dialysis on Monday Tuesday, Thursday, Saturday 02/05/19   [provider]  midodrine (PROAMATINE) 2.5 MG tablet Take 1 tablet  (2.5 mg total) by mouth 3 (three) times daily with meals. Patient should continue to take 10 mg in the morning prior to hemodialysis sessions. 02/25/19   Norval Morton, MD  nitroGLYCERIN (NITROSTAT) 0.4 MG SL tablet Place 1 tablet (0.4 mg total) under the tongue every 5 (five) minutes as needed for chest pain. 04/19/18   Rai, Ripudeep Raliegh Ip, MD  OXYGEN Inhale 2-3 L into the lungs continuous. 2-3 lpm 24/7     [provider]  pantoprazole (PROTONIX) 40 MG tablet Take 1 tablet (40 mg total) by mouth daily. Patient taking differently: Take 40 mg by mouth every evening.  08/29/18   Deboraha Sprang, MD  sevelamer carbonate (RENVELA) 800 MG tablet Take 1,600-2,400 mg by mouth See admin instructions. Take 3 tablets (2400 mg) by mouth with meals and 2 tablets (1600 mg) with snacks    [provider]  Skin Protectants, Misc. (EUCERIN) cream Apply 1 application topically at bedtime.    [provider]    Family History Family History  Problem Relation Age of Onset   Diabetes Father    Heart disease Father    Diabetes Sister    Alzheimer's disease Mother    Stroke Mother    Diabetes Paternal Grandmother    Alzheimer's disease Maternal Aunt        x 8 Maternal Aunts   Cancer Maternal Uncle        type unknown    Social History Social History   Tobacco Use   Smoking status: Former Smoker    Packs/day: 2.00    Years: 32.00    Pack years: 64.00    Last attempt to quit: 11/27/1983    Years since quitting: 35.3   Smokeless tobacco: Never Used  Substance Use Topics   Alcohol use: Yes    Comment: rarely   Drug use: No     Allergies   Byetta 10 mcg pen [exenatide]; Codeine; and Coumadin [warfarin sodium]   Review of Systems Review of Systems  Musculoskeletal:       Left foot pain  All other systems reviewed and are negative.    Physical Exam Updated Vital Signs BP (!) 147/129 (BP Location: Right Arm)    Pulse (!) 58    Temp 98.2 F (36.8 C)  (Rectal)    Resp 20    Wt 98 kg  SpO2 100%    BMI 30.13 kg/m   Physical Exam Vitals signs and nursing note reviewed.  Constitutional:      Appearance: Normal appearance.  HENT:     Head: Normocephalic and atraumatic.     Right Ear: External ear normal.     Left Ear: External ear normal.     Nose: Nose normal.     Mouth/Throat:     Mouth: Mucous membranes are moist.     Pharynx: Oropharynx is clear.  Eyes:     Extraocular Movements: Extraocular movements intact.     Conjunctiva/sclera: Conjunctivae normal.     Pupils: Pupils are equal, round, and reactive to light.  Neck:     Musculoskeletal: Normal range of motion and neck supple.  Cardiovascular:     Rate and Rhythm: Normal rate and regular rhythm.     Pulses: Normal pulses.     Heart sounds: Normal heart sounds.  Pulmonary:     Effort: Pulmonary effort is normal.     Breath sounds: Normal breath sounds.  Abdominal:     General: Abdomen is flat. Bowel sounds are normal.     Palpations: Abdomen is soft.  Musculoskeletal:     Comments: See picture of left foot.  Good thrill LUE  Skin:    Capillary Refill: Capillary refill takes less than 2 seconds.  Neurological:     General: No focal deficit present.     Mental Status: He is alert and oriented to person, place, and time.  Psychiatric:        Mood and Affect: Mood normal.        Behavior: Behavior normal.          ED Treatments / Results  Labs (all labs ordered are listed, but only abnormal results are displayed) Labs Reviewed  COMPREHENSIVE METABOLIC PANEL - Abnormal; Notable for the following components:      Result Value   Sodium 132 (*)    Chloride 91 (*)    Glucose, Bld 222 (*)    BUN 26 (*)    Creatinine, Ser 3.72 (*)    Albumin 2.9 (*)    Alkaline Phosphatase 134 (*)    Total Bilirubin 1.8 (*)    GFR calc non Af Amer 14 (*)    GFR calc Af Amer 17 (*)    Anion gap 17 (*)    All other components within normal limits  LACTIC ACID, PLASMA -  Abnormal; Notable for the following components:   Lactic Acid, Venous 2.1 (*)    All other components within normal limits  TROPONIN I - Abnormal; Notable for the following components:   Troponin I 0.63 (*)    All other components within normal limits  CBC WITH DIFFERENTIAL/PLATELET - Abnormal; Notable for the following components:   WBC 10.7 (*)    RBC 3.95 (*)    Hemoglobin 11.3 (*)    HCT 36.2 (*)    RDW 16.5 (*)    Neutro Abs 7.9 (*)    Monocytes Absolute 1.2 (*)    All other components within normal limits  SARS CORONAVIRUS 2 (HOSPITAL ORDER, [place] LAB)  CULTURE, BLOOD (ROUTINE X 2)  CULTURE, BLOOD (ROUTINE X 2)  CBC WITH DIFFERENTIAL/PLATELET  LACTIC ACID, PLASMA    EKG EKG Interpretation  Date/Time:  Friday Apr 10 2019 12:58:07 EDT Ventricular Rate:  54 PR Interval:    QRS Duration: 163 QT Interval:  461 QTC Calculation: 437 R Axis:   -  85 Text Interpretation:  Junctional rhythm RBBB and LAFB Abnormal T, consider ischemia, lateral leads No significant change since last tracing Confirmed by Isla Pence [phone]) on 04/10/2019 1:32:20 PM   Radiology Dg Chest 2 View  Result Date: 04/10/2019 CLINICAL DATA:  Black toes, pain, shortness of breath EXAM: CHEST - 2 VIEW COMPARISON:  02/20/2019 FINDINGS: Cardiomegaly status post aortic valve stent endograft repair. Right chest multi lead pacer. There is minimal, diffuse interstitial pulmonary opacity bilaterally without focal airspace abnormality. The visualized skeletal structures are unremarkable. IMPRESSION: Cardiomegaly with minimal, diffuse interstitial pulmonary opacity, which may reflect minimal edema or chronic interstitial change. There is no focal airspace opacity. Electronically Signed   By: Eddie Candle M.D.   On: 04/10/2019 14:17   Dg Foot Complete Left  Result Date: 04/10/2019 CLINICAL DATA:  Initial evaluation for acute pain, osteomyelitis. EXAM: LEFT FOOT - COMPLETE 3+ VIEW  COMPARISON:  None. FINDINGS: No acute fracture or dislocation. Scattered osteoarthritic changes present throughout the midfoot. Subtle progressive osseous erosion at the right fourth distal phalanx as compared to previous exam, suspicious for possible osteomyelitis. Soft tissue ulceration overlies the great toe. Subtle erosive changes at the underlying right first metatarsal head also suspicious for possible osteomyelitis. No other definite osseous erosions. Calcified atherosclerosis. IMPRESSION: 1. Subtle progressive osseous erosion at the right fourth distal phalanx as compared to previous exam, suspicious for osteomyelitis. 2. Soft tissue ulceration overlying the right great toe. Erosive changes involving the underlying right first metatarsal head also suspicious for osteomyelitis. 3. Extensive atherosclerosis. Electronically Signed   By: Jeannine Boga M.D.   On: 04/10/2019 14:20    Procedures Procedures (including critical care time)  Medications Ordered in ED Medications  vancomycin (VANCOCIN) 2,000 mg in sodium chloride 0.9 % 500 mL IVPB (has no administration in time range)  vancomycin (VANCOCIN) IVPB 1000 mg/200 mL premix (has no administration in time range)  sodium chloride 0.9 % bolus 500 mL (0 mLs Intravenous Stopped 04/10/19 1451)  morphine 4 MG/ML injection 4 mg (4 mg Intravenous Given 04/10/19 1419)  ondansetron (ZOFRAN) injection 4 mg (4 mg Intravenous Given 04/10/19 1419)  cefTAZidime (FORTAZ) 1 g in sodium chloride 0.9 % 100 mL IVPB (0 g Intravenous Stopped 04/10/19 1511)     Initial Impression / Assessment and Plan / ED Course  I have reviewed the triage vital signs and the nursing notes.  Pertinent labs & imaging results that were available during my care of the patient were reviewed by me and considered in my medical decision making (see chart for details).  Pt does have gangrene and osteomyelitis on xray.  He is started on abx.  His labs are unremarkable for pt. BP is  slightly low which responded to gentle fluids.  Lactic acid is minimally elevated.  He is afebrile.  Troponin elevation is chronic.    Pt d/w Dr. Tamala Julian (triad) for admission.   CRITICAL CARE Performed by: Isla Pence   Total critical care time: 30 minutes  Critical care time was exclusive of separately billable procedures and treating other patients.  Critical care was necessary to treat or prevent imminent or life-threatening deterioration.  Critical care was time spent personally by me on the following activities: development of treatment plan with patient and/or surrogate as well as nursing, discussions with consultants, evaluation of patient's response to treatment, examination of patient, obtaining history from patient or surrogate, ordering and performing treatments and interventions, ordering and review of laboratory studies, ordering and review of radiographic studies, pulse  oximetry and re-evaluation of patient's condition.  Final Clinical Impressions(s) / ED Diagnoses   Final diagnoses:  Other acute osteomyelitis of left foot ([place])  PVD (peripheral vascular disease) ([place])  ESRD on hemodialysis ([place])  Gangrene of left foot [facility])    ED Discharge Orders    None       Isla Pence, MD 04/10/19 1510    Isla Pence, MD 04/10/19 1511

## 2019-04-10 NOTE — H&P (Addendum)
History and Physical    Jeffrey [name]:[id number] DOB: 22-Sep-1941 DOA: 04/10/2019  Referring MD/NP/PA: Isla Pence, MD PCP: Scot Jun, FNP  Patient coming from: Home  Chief Complaint: Left foot pain  I have personally briefly reviewed patient's old medical records in [place]   HPI: Jeffrey [name] is a 78 y.o. male with medical history significant of CHF, s/p pacemaker placement, s/p TAVR, A. fib on apixaban, CAD s/p stenting, ESRD on HD, DM type 2, PVD, and anemia; who presents with complaints of worsening left foot pain.  Admitted in March for cellulitis of the same foot, but reports that has not been getting better.  At this time he reports that he is wheelchair-bound and his son and wife help him with transfers at home.  He had an abdominal aortogram along with the lower extremities performed by Dr. Scot Dock on 5/1.  Based off of that study he was thought to have reasonable flow to the left foot with real good options for revascularization.  At that time he felt that his toes were similar to now with dark appearance of the second, third, and fourth toe.  He was advised to wash foot daily and keep it clean which he has done.  However, over the last 14 days he has developed progressively worsening pain that he describes as sharp and shooting.  He has been trying to take Tylenol without any relief of symptoms.  Denies having any significant fever, chills, abdominal pain, nausea, vomiting, or diarrhea.  He last had hemodialysis yesterday.   ED Course: On admission to the emergency department patient was noted to be afebrile, pulse 51-60, respirations 14-23, blood pressure 93/48-147/129, and O2 saturations maintained on 2 L nasal cannula oxygen.  Labs revealed WBC 10.7, hemoglobin 11.3, sodium 132, potassium 5, BUN 26, creatinine 3.72, glucose 222, lactic acid 2.1 ->2.2, and troponin  0.63.  X-rays revealed erosive osseous changes of the fourth digit left foot concerning for  osteomyelitis.  Blood cultures were obtained.  Patient was given empiric antibiotics of vancomycin and Fortaz.  Review of Systems  Constitutional: Negative for chills and fever.  HENT: Negative for ear discharge and nosebleeds.   Eyes: Negative for photophobia and pain.  Respiratory: Positive for cough (Chronic). Negative for shortness of breath.   Cardiovascular: Positive for leg swelling. Negative for chest pain and orthopnea.  Gastrointestinal: Negative for abdominal pain, blood in stool, nausea and vomiting.  Genitourinary: Negative for dysuria and hematuria.  Musculoskeletal: Positive for joint pain and myalgias.  Skin:       Positive for skin color change  Neurological: Negative for speech change and loss of consciousness.  Psychiatric/Behavioral: Negative for memory loss and substance abuse.    Past Medical History:  Diagnosis Date  . Anginal pain ([place])   . Aortic stenosis    a. severe by echo 09/2014  . Atrial fibrillation ([place])    a. not well documented, not on anticoagulation  . CHF (congestive heart failure) ([place])    04/28/17 echo-EF 40%, mod LVH, diastolic dysfunction  . Claustrophobia   . Complete heart block ([place])   . COPD (chronic obstructive pulmonary disease) ([place])   . Coronary artery disease    a. chronically occluded RCA per cath 09/2014 with collaterals B. cath 05/01/17 chr occ RCA w/collaterals, 60-70% mid LAD,   . CVA (cerebral vascular accident) ([place]) 10/2014   denies residual on 07/11/2015  . ESRD (end stage renal disease) on dialysis ([place])  a. on dialysis; [facility]; MWF, LUE fistula (07/11/2015)  . History of blood transfusion    "related to gallbladder OR"  . History of stomach ulcers   . Hyperlipidemia   . Hypertension   . Iron deficiency anemia   . Myocardial infarction ([place]) 10/2014  . Peripheral vascular disease ([place])   . Pneumonia   . Presence of permanent cardiac pacemaker   . 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 Type II diabetes mellitus ([place])     Past Surgical History:  Procedure Laterality Date  . ABDOMINAL AORTOGRAM W/LOWER EXTREMITY Bilateral 03/27/2019   Procedure: ABDOMINAL AORTOGRAM W/LOWER EXTREMITY;  Surgeon: Angelia Mould, MD;  Location: [facility];  Service: Cardiovascular;  Laterality: Bilateral;  . AV FISTULA PLACEMENT Left 10/19/2014   Procedure: BRACHIOCEPHALIC ARTERIOVENOUS (AV) FISTULA CREATION ;  Surgeon: Conrad Harristown, MD;  Location: [facility];  Service: Vascular;  Laterality: Left;  . CARDIAC CATHETERIZATION    . CARDIAC CATHETERIZATION N/A 07/22/2015   Procedure: Right/Left Heart Cath and Coronary Angiography;  Surgeon: [name], MD;  Location: [facility];  Service: Cardiovascular;  Laterality: N/A;  . CATARACT EXTRACTION W/ INTRAOCULAR LENS  IMPLANT, BILATERAL Bilateral 1990's  . CHOLECYSTECTOMY OPEN  1980's  . COLONOSCOPY W/ BIOPSIES AND POLYPECTOMY    . CORONARY ANGIOGRAPHY N/A 07/31/2018   Procedure: CORONARY ANGIOGRAPHY;  Surgeon: Martinique, Peter M, MD;  Location: [facility];  Service: Cardiovascular;  Laterality: N/A;  . CORONARY ANGIOPLASTY    . CORONARY ATHERECTOMY N/A 04/18/2018   Procedure: CORONARY ATHERECTOMY;  Surgeon: [name], MD;  Location: [facility];  Service: Cardiovascular;  Laterality: N/A;  . CORONARY BALLOON ANGIOPLASTY N/A 07/31/2018   Procedure: CORONARY BALLOON ANGIOPLASTY;  Surgeon: Martinique, Peter M, MD;  Location: [facility];  Service: Cardiovascular;  Laterality: N/A;  . CORONARY STENT INTERVENTION N/A 04/18/2018   Procedure: CORONARY STENT INTERVENTION;  Surgeon: [name], MD;  Location: [facility];  Service: Cardiovascular;  Laterality: N/A;  . EP IMPLANTABLE DEVICE N/A 07/11/2015   Procedure: Pacemaker Implant;  Surgeon: [name], MD;  Location: [facility];  Service: Cardiovascular;  Laterality: N/A;  . ESOPHAGOGASTRODUODENOSCOPY  08/01/2012   Procedure: ESOPHAGOGASTRODUODENOSCOPY (EGD);  Surgeon: [name], MD;  Location: [facility];  Service: Endoscopy;  Laterality: N/A;  . INSERT / REPLACE / REMOVE PACEMAKER  07/11/2015  . INSERTION OF DIALYSIS CATHETER Right 02/02/2015   Procedure: INSERTION OF DIALYSIS CATHETER  RIGHT INTERNAL JUGULAR;  Surgeon: Mal [name], MD;  Location: [facility];  Service: Vascular;  Laterality: Right;  . LEFT AND RIGHT HEART CATHETERIZATION WITH CORONARY ANGIOGRAM N/A 09/30/2014   Procedure: LEFT AND RIGHT HEART CATHETERIZATION WITH CORONARY ANGIOGRAM;  Surgeon: [name], MD;  Location: [facility];  Service: Cardiovascular;  Laterality: N/A;  . LEFT HEART CATH AND CORONARY ANGIOGRAPHY N/A 04/14/2018   Procedure: LEFT HEART CATH AND CORONARY ANGIOGRAPHY;  Surgeon: [name], MD;  Location: [facility];  Service: Cardiovascular;  Laterality: N/A;  . TEE WITHOUT CARDIOVERSION N/A 08/02/2015   Procedure: TRANSESOPHAGEAL ECHOCARDIOGRAM (TEE);  Surgeon: [name], MD;  Location: [facility];  Service: Open Heart Surgery;  Laterality: N/A;  . TONSILLECTOMY    . TRANSCATHETER AORTIC VALVE REPLACEMENT, TRANSFEMORAL Left 08/02/2015   Procedure: TRANSCATHETER AORTIC VALVE REPLACEMENT,  TRANSFEMORAL;  Surgeon: [name], MD;  Location: [facility];  Service: Open Heart Surgery;  Laterality: Left;     reports that he quit smoking about 35 years ago. He has a 64.00 pack-year smoking history. He has never used smokeless tobacco. He reports current alcohol use. He reports that he does not use drugs.  Allergies  Allergen Reactions  . Byetta 10 Mcg Pen [Exenatide] Diarrhea and Nausea And Vomiting  . Codeine Itching  . Coumadin [Warfarin Sodium] Rash    (wife states coumadin was stopped but rash did not disappear 02/21/19)    Family History  Problem Relation Age of Onset  . Diabetes Father   . Heart disease Father    . Diabetes Sister   . Alzheimer's disease Mother   . Stroke Mother   . Diabetes Paternal Grandmother   . Alzheimer's disease Maternal Aunt        x 8 Maternal Aunts  . Cancer Maternal Uncle        type unknown    Prior to Admission medications   Medication Sig Start Date End Date Taking? Authorizing Provider  acetaminophen (TYLENOL) 500 MG tablet Take 500 mg by mouth every 6 (six) hours as needed for headache (pain).    [provider]  albuterol (PROVENTIL HFA;VENTOLIN HFA) 108 (90 Base) MCG/ACT inhaler Inhale 2 puffs into the lungs every 4 (four) hours as needed for wheezing or shortness of breath. 11/27/18   Tanda Rockers, MD  albuterol (PROVENTIL) (2.5 MG/3ML) 0.083% nebulizer solution Take 3 mLs (2.5 mg total) by nebulization every 6 (six) hours as needed for wheezing or shortness of breath. 01/07/19   Scot Jun, FNP  ALPRAZolam Duanne Moron) 0.25 MG tablet Take 1 tablet (0.25 mg total) by mouth 2 (two) times daily as needed for anxiety. 02/24/19   Norval Morton, MD  apixaban (ELIQUIS) 2.5 MG TABS tablet Take 1 tablet (2.5 mg total) by mouth 2 (two) times daily. 02/24/19   Norval Morton, MD  atorvastatin (LIPITOR) 80 MG tablet Take 1 tablet (80 mg total) by mouth daily at 6 PM. Patient taking differently: Take 80 mg by mouth every evening.  01/07/19   Scot Jun, FNP  budesonide-formoterol [facility]) 160-4.5 MCG/ACT inhaler Inhale 2 puffs into the lungs 2 (two) times daily. Patient taking differently: Inhale 2 puffs into the lungs 2 (two) times daily as needed (asthma).  11/27/18   Tanda Rockers, MD  cinacalcet (SENSIPAR) 30 MG tablet Take 30 mg by mouth See admin instructions. Take one tablet (30 mg) by mouth on Monday, Tuesday, Thursday, Saturday after dialysis    [provider]  clindamycin (CLEOCIN) 300 MG capsule Take 1 capsule (300 mg total) by mouth 3 (three) times daily. Patient not taking: Reported on 03/26/2019 02/24/19   Norval Morton, MD   clopidogrel (PLAVIX) 75 MG tablet Take 1 tablet (75 mg total) by mouth daily. Patient taking differently: Take 75 mg by mouth every evening.  09/02/18   Martinique, Peter M, MD  doxercalciferol (HECTOROL) 4 MCG/2ML injection Inject 0.5 mLs (1 mcg total) into the vein Every Tuesday,Thursday,and Saturday with dialysis. Patient taking differently: Inject 1 mcg into the vein See admin instructions. At dialysis 02/26/19   Norval Morton, MD  guaiFENesin (MUCINEX) 600 MG 12 hr tablet Take 1 tablet (600 mg total) by mouth 2 (two) times daily. Patient taking differently: Take 600 mg by mouth 2 (two) times daily as needed for cough or to loosen phlegm.  01/07/19   Scot Jun, FNP  hydrOXYzine (ATARAX/VISTARIL) 25 MG tablet Take 0.5-1 tablets (12.5-25 mg total) by mouth 2 (two) times daily as needed for itching. 01/07/19   Scot Jun, FNP  insulin detemir (LEVEMIR) 100 UNIT/ML injection Inject 0.28 mLs (28 Units total) into the skin at bedtime. 02/24/19   Fuller Plan A, MD  Insulin Pen Needle 31G X 6 MM MISC at bedtime.  08/28/18   [provider]  lidocaine-prilocaine (EMLA) cream Apply 1 application topically See admin instructions. Apply topically prior to dialysis (Monday, Tuesday, Thursday, Saturday)    [provider]  loratadine (CLARITIN) 10 MG tablet Take 10 mg by mouth daily as needed for allergies.     [provider]  Melatonin 5 MG TABS Take 5-10 mg by mouth at bedtime as needed (sleep).     [provider]  metoprolol tartrate (LOPRESSOR) 25 MG tablet Take 0.5 tablets (12.5 mg total) by mouth See admin instructions. Take 1/2 tablet (12.5 mg) by mouth on Sunday, Monday, Wednesday, Friday mornings (non-dialysis days), take 1/2 tablet (12.5 mg) every evening Patient taking differently: Take 12.5 mg by mouth See admin instructions. Take 1/2 tablet (12.5 mg) by mouth on Sunday, Wednesday, Friday mornings (non-dialysis days), take 1/2 tablet (12.5 mg) every  evening 03/04/19   Scot Jun, FNP  midodrine (PROAMATINE) 10 MG tablet Take 10 mg by mouth See admin instructions. Take one tablet (10 mg) by mouth 30 minutes before dialysis on Monday Tuesday, Thursday, Saturday 02/05/19   [provider]  midodrine (PROAMATINE) 2.5 MG tablet Take 1 tablet (2.5 mg total) by mouth 3 (three) times daily with meals. Patient should continue to take 10 mg in the morning prior to hemodialysis sessions. 02/25/19   Norval Morton, MD  nitroGLYCERIN (NITROSTAT) 0.4 MG SL tablet Place 1 tablet (0.4 mg total) under the tongue every 5 (five) minutes as needed for chest pain. 04/19/18   Rai, Ripudeep Raliegh Ip, MD  OXYGEN Inhale 2-3 L into the lungs continuous. 2-3 lpm 24/7     [provider]  pantoprazole (PROTONIX) 40 MG tablet Take 1 tablet (40 mg total) by mouth daily. Patient taking differently: Take 40 mg by mouth every evening.  08/29/18   Deboraha Sprang, MD  sevelamer carbonate (RENVELA) 800 MG tablet Take 1,600-2,400 mg by mouth See admin instructions. Take 3 tablets (2400 mg) by mouth with meals and 2 tablets (1600 mg) with snacks    [provider]  Skin Protectants, Misc. (EUCERIN) cream Apply 1 application topically at bedtime.    [provider]    Physical Exam:  Constitutional: Elderly obese male NAD, calm, comfortable Vitals:   04/10/19 1419 04/10/19 1456 04/10/19 1500 04/10/19 1504  BP: (!) 147/129 (!) 106/46 (!) 95/49 (!) 93/48  Pulse: (!) 58 (!) 52 (!) 55 (!) 55  Resp: _0 Temp:      TempSrc:      SpO2: 100% 100% 97% 100%  Weight: 98 kg      Eyes: PERRL, lids and conjunctivae normal ENMT: Mucous membranes are moist. Posterior pharynx clear of any exudate or lesions.Normal dentition.  Neck: normal, supple, no masses, no thyromegaly Respiratory: clear to auscultation bilaterally, no wheezing, no crackles. Normal respiratory effort. No accessory muscle use.  Cardiovascular: Bradycardic, no murmurs / rubs /  gallops.  No significant lower extremity edema. 1+ pedal pulses on the left lower extremity. No carotid bruits.  Abdomen: no tenderness, no masses  palpated. No hepatosplenomegaly. Bowel sounds positive.  Musculoskeletal: no clubbing / cyanosis. No joint deformity upper and lower extremities. Good ROM, no contractures. Normal muscle tone.  Skin: Necrotic black appearance of dorsal aspect of 2nd, 3rd, 4th,and part of 5th toe with surrounding erythema Neurologic: CN 2-12 grossly intact. Strength 5/5 in all 4.  Psychiatric: Normal judgment and insight. Alert and oriented x 3. Normal mood.     Labs on Admission: I have personally reviewed following labs and imaging studies  CBC: Recent Labs  Lab 04/10/19 1400  WBC 10.7*  NEUTROABS 7.9*  HGB 11.3*  HCT 36.2*  MCV 91.6  PLT 599   Basic Metabolic Panel: Recent Labs  Lab 04/10/19 1305  NA 132*  K 5.0  CL 91*  CO2 24  GLUCOSE 222*  BUN 26*  CREATININE 3.72*  CALCIUM 9.0   GFR: Estimated Creatinine Clearance: 18.9 mL/min (A) (by C-G formula based on SCr of 3.72 mg/dL (H)). Liver Function Tests: Recent Labs  Lab 04/10/19 1305  AST 26  ALT 18  ALKPHOS 134*  BILITOT 1.8*  PROT 6.5  ALBUMIN 2.9*   No results for input(s): LIPASE, AMYLASE in the last 168 hours. No results for input(s): AMMONIA in the last 168 hours. Coagulation Profile: No results for input(s): INR, PROTIME in the last 168 hours. Cardiac Enzymes: Recent Labs  Lab 04/10/19 1305  TROPONINI 0.63*   BNP (last 3 results) No results for input(s): PROBNP in the last 8760 hours. HbA1C: No results for input(s): HGBA1C in the last 72 hours. CBG: No results for input(s): GLUCAP in the last 168 hours. Lipid Profile: No results for input(s): CHOL, HDL, LDLCALC, TRIG, CHOLHDL, LDLDIRECT in the last 72 hours. Thyroid Function Tests: No results for input(s): TSH, T4TOTAL, FREET4, T3FREE, THYROIDAB in the last 72 hours. Anemia Panel: No results for input(s):  VITAMINB12, FOLATE, FERRITIN, TIBC, IRON, RETICCTPCT in the last 72 hours. Urine analysis:    Component Value Date/Time   COLORURINE YELLOW 06/17/2017 [place] 06/17/2017 0547   LABSPEC 1.010 06/17/2017 0547   PHURINE 6.0 06/17/2017 0547   GLUCOSEU >=500 (A) 06/17/2017 0547   HGBUR SMALL (A) 06/17/2017 0547   BILIRUBINUR NEGATIVE 06/17/2017 0547   KETONESUR NEGATIVE 06/17/2017 0547   PROTEINUR 100 (A) 06/17/2017 0547   UROBILINOGEN 0.2 09/28/2014 2257   NITRITE NEGATIVE 06/17/2017 0547   LEUKOCYTESUR TRACE (A) 06/17/2017 0547   Sepsis Labs: Recent Results (from the past 240 hour(s))  SARS Coronavirus 2 (CEPHEID - Performed in [facility] hospital lab), Hosp Order     Status: None   Collection Time: 04/10/19 12:56 PM  Result Value Ref Range Status   SARS Coronavirus 2 NEGATIVE NEGATIVE Final    Comment: (NOTE) If result is NEGATIVE SARS-CoV-2 target nucleic acids are NOT DETECTED. The SARS-CoV-2 RNA is generally detectable in upper and lower  respiratory specimens during the acute phase of infection. The lowest  concentration of SARS-CoV-2 viral copies this assay can detect is 250  copies / mL. A negative result does not preclude SARS-CoV-2 infection  and should not be used as the sole basis for treatment or other  patient management decisions.  A negative result may occur with  improper specimen collection / handling, submission of specimen other  than nasopharyngeal swab, presence of viral mutation(s) within the  areas targeted by this assay, and inadequate number of viral copies  (<250 copies / mL). A negative result must be combined with clinical  observations, patient history, and  epidemiological information. If result is POSITIVE SARS-CoV-2 target nucleic acids are DETECTED. The SARS-CoV-2 RNA is generally detectable in upper and lower  respiratory specimens dur ing the acute phase of infection.  Positive  results are indicative of active infection with  SARS-CoV-2.  Clinical  correlation with patient history and other diagnostic information is  necessary to determine patient infection status.  Positive results do  not rule out bacterial infection or co-infection with other viruses. If result is PRESUMPTIVE POSTIVE SARS-CoV-2 nucleic acids MAY BE PRESENT.   A presumptive positive result was obtained on the submitted specimen  and confirmed on repeat testing.  While 2019 novel coronavirus  (SARS-CoV-2) nucleic acids may be present in the submitted sample  additional confirmatory testing may be necessary for epidemiological  and / or clinical management purposes  to differentiate between  SARS-CoV-2 and other Sarbecovirus currently known to infect humans.  If clinically indicated additional testing with an alternate test  methodology [phone]) is advised. The SARS-CoV-2 RNA is generally  detectable in upper and lower respiratory sp ecimens during the acute  phase of infection. The expected result is Negative. Fact Sheet for Patients:  StrictlyIdeas.no Fact Sheet for Healthcare Providers: BankingDealers.co.za This test is not yet approved or cleared by the [place] FDA and has been authorized for detection and/or diagnosis of SARS-CoV-2 by FDA under an Emergency Use Authorization (EUA).  This EUA will remain in effect (meaning this test can be used) for the duration of the COVID-19 declaration under Section 564(b)(1) of the Act, 21 U.S.C. section 360bbb-3(b)(1), unless the authorization is terminated or revoked sooner. Performed at [facility], [place] [address], [place], [place] [address]      Radiological Exams on Admission: Dg Chest 2 View  Result Date: 04/10/2019 CLINICAL DATA:  Black toes, pain, shortness of breath EXAM: CHEST - 2 VIEW COMPARISON:  02/20/2019 FINDINGS: Cardiomegaly status post aortic valve stent endograft repair. Right chest multi lead pacer. There is  minimal, diffuse interstitial pulmonary opacity bilaterally without focal airspace abnormality. The visualized skeletal structures are unremarkable. IMPRESSION: Cardiomegaly with minimal, diffuse interstitial pulmonary opacity, which may reflect minimal edema or chronic interstitial change. There is no focal airspace opacity. Electronically Signed   By: Eddie Candle M.D.   On: 04/10/2019 14:17   Dg Foot Complete Left  Result Date: 04/10/2019 CLINICAL DATA:  Initial evaluation for acute pain, osteomyelitis. EXAM: LEFT FOOT - COMPLETE 3+ VIEW COMPARISON:  None. FINDINGS: No acute fracture or dislocation. Scattered osteoarthritic changes present throughout the midfoot. Subtle progressive osseous erosion at the right fourth distal phalanx as compared to previous exam, suspicious for possible osteomyelitis. Soft tissue ulceration overlies the great toe. Subtle erosive changes at the underlying right first metatarsal head also suspicious for possible osteomyelitis. No other definite osseous erosions. Calcified atherosclerosis. IMPRESSION: 1. Subtle progressive osseous erosion at the right fourth distal phalanx as compared to previous exam, suspicious for osteomyelitis. 2. Soft tissue ulceration overlying the right great toe. Erosive changes involving the underlying right first metatarsal head also suspicious for osteomyelitis. 3. Extensive atherosclerosis. Electronically Signed   By: Jeannine Boga M.D.   On: 04/10/2019 14:20    EKG: Independently reviewed.  Junctional rhythm at 54 bpm.  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  Patient presents afebrile, with respirations up to 23, WBC 10.7, and lactic acid 2.1.  X-rays of the left foot showed progressive osseous erosions concerning for osteomyelitis at the fourth distal phalanx.  Given  the gangrenous appearance of the toes question need of amputation.  Vascular surgery was consulted given recent aortogram with  Dr. Scot Dock on 5/1. -Admit to a telemetry bed -Follow-up blood cultures -Check ESR and CRP -Repeat CBC in a.m. -Continue empiric antibiotics of vancomycin and Fortaz per pharmacy -Appreciate vascular surgery consultative services, will follow-up for further recommendation    Permanent atrial fibrillation on chronic anticoagulation, s/p PM: Currently rate controlled.  Patient on medications of Eliquis and metoprolol. -Holding parameters for metoprolol -Hold Eliquis for possible need of surgical procedure  Elevated troponin, CAD: Chronic.  Patient denies any reports of chest pain.  Initial troponin  0.63.  Review of records showed troponin elevated up to 1.15 on 11/30/2018.  -Check troponin in a.m.  ESRD on HD: Patient normally dialyzes on Tuesday, Thursday, Saturday.  Notes his last hemodialysis session was on 5/14. -Nephrology consulted   Diabetes mellitus type 2: At home patient on Levemir 28 units nightly.  Last hemoglobin A1c noted to be 6.3 on 01/07/2019. -Hypoglycemic protocol -Recheck hemoglobin A1c in a.m. -CBGs q. before meals and at bedtime with moderate SSI -Restart home insulin regimen when medically appropriate  Peripheral vascular disease -Plavix currently held due to possible need of surgical procedure  Hypotension: Chronic. Patient notes systolic blood pressures normally will be in the 80s.  Maps currently greater than 65. -Continue midodrine  Anemia of chronic disease: Hemoglobin 11.3 g/dL.  Patient does not report any acute bleeding. -Follow-up repeat CBC in a.m.  COPD, without exacerbation chronic respiratory failure with hypoxia -Continue home 2 L of nasal cannula oxygen -Pharmacy substitution of Dulera for Symbicort -Albuterol nebs as needed for shortness of breath/wheezing  Aortic stenosis s/p TAVR: Stable  Anxiety -Continue Xanax as needed anxiety  DVT prophylaxis: SCDs Code Status: Full Family Communication: No family present at bedside Disposition  Plan: To be determined Consults called: Vascular surgery Admission status: Inpatient  Norval Morton MD [facility] Pager [phone]   If 7PM-7AM, please contact night-coverage [email or web address] Password [facility]  04/10/2019, 3:31 PM

## 2019-04-10 NOTE — Consult Note (Signed)
Hospital Consult    Reason for Consult:  Left foot wound Referring Physician:  ED/Hospitalist MRN #:  [id number]  History of Present Illness: This is a 78 y.o. male with multiple medical problems including aortic stenosis, atrial fibrillation on anticoagulation, heart failure, COPD, coronary artery disease, end-stage renal disease on hemodialysis, diabetes that vascular surgery has been called to evaluate left foot wound.  Patient is well-known to the vascular surgery service and has been previously evaluated by Dr. Scot Dock and underwent an arteriogram of his left leg on 03/27/2019.  Per review patient states he was advised to continue wound care after arteriogram.  Dr. Scot Dock noted that he had calcific disease throughout the left lower extremity however the SFA popliteal were patent without critical stenosis.  He had calcified diffuse disease in his tibials with more disease in the anterior tibial but his peroneal and posterior tibial were open with inline flow into the foot. Patient states he has had progression of his foot wound over the last week.  He was seen in dialysis and ultimately the physician that evaluated him recommended that he go to the ED.  He denies any fevers or chills at home.  States he spends most of his time in a wheelchair but does pivot.   Past Medical History:  Diagnosis Date  . Anginal pain ([place])   . Aortic stenosis    a. severe by echo 09/2014  . Atrial fibrillation ([place])    a. not well documented, not on anticoagulation  . CHF (congestive heart failure) ([place])    04/28/17 echo-EF 40%, mod LVH, diastolic dysfunction  . Claustrophobia   . Complete heart block ([place])   . COPD (chronic obstructive pulmonary disease) ([place])   . Coronary artery disease    a. chronically occluded RCA per cath 09/2014 with collaterals B. cath 05/01/17 chr occ RCA w/collaterals, 60-70% mid LAD,   . CVA (cerebral vascular accident) ([place]) 10/2014   denies residual on 07/11/2015  . ESRD (end stage  renal disease) on dialysis [facility])    a. on dialysis; [facility]; MWF, LUE fistula (07/11/2015)  . History of blood transfusion    "related to gallbladder OR"  . History of stomach ulcers   . Hyperlipidemia   . Hypertension   . Iron deficiency anemia   . Myocardial infarction ([place]) 10/2014  . Peripheral vascular disease ([place])   . Pneumonia   . Presence of permanent cardiac pacemaker   . 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 Type II diabetes mellitus ([place])     Past Surgical History:  Procedure Laterality Date  . ABDOMINAL AORTOGRAM W/LOWER EXTREMITY Bilateral 03/27/2019   Procedure: ABDOMINAL AORTOGRAM W/LOWER EXTREMITY;  Surgeon: Angelia Mould, MD;  Location: [place] [facility];  Service: Cardiovascular;  Laterality: Bilateral;  . AV FISTULA PLACEMENT Left 10/19/2014   Procedure: BRACHIOCEPHALIC ARTERIOVENOUS (AV) FISTULA CREATION ;  Surgeon: Conrad , MD;  Location: [place];  Service: Vascular;  Laterality: Left;  . CARDIAC CATHETERIZATION    . CARDIAC CATHETERIZATION N/A 07/22/2015   Procedure: Right/Left Heart Cath and Coronary Angiography;  Surgeon: [name], MD;  Location: [place] [facility];  Service: Cardiovascular;  Laterality: N/A;  . CATARACT EXTRACTION W/ INTRAOCULAR LENS  IMPLANT, BILATERAL Bilateral 1990's  . CHOLECYSTECTOMY OPEN  1980's  . COLONOSCOPY W/ BIOPSIES AND POLYPECTOMY    . CORONARY ANGIOGRAPHY N/A 07/31/2018  Procedure: CORONARY ANGIOGRAPHY;  Surgeon: Martinique, Peter M, MD;  Location: [place] [facility];  Service: Cardiovascular;  Laterality: N/A;  . CORONARY ANGIOPLASTY    . CORONARY ATHERECTOMY N/A 04/18/2018   Procedure: CORONARY ATHERECTOMY;  Surgeon: [name], MD;  Location: [place] [facility];  Service: Cardiovascular;  Laterality: N/A;  . CORONARY BALLOON ANGIOPLASTY N/A 07/31/2018   Procedure:  CORONARY BALLOON ANGIOPLASTY;  Surgeon: Martinique, Peter M, MD;  Location: [place] [facility];  Service: Cardiovascular;  Laterality: N/A;  . CORONARY STENT INTERVENTION N/A 04/18/2018   Procedure: CORONARY STENT INTERVENTION;  Surgeon: [name], MD;  Location: [place] [facility];  Service: Cardiovascular;  Laterality: N/A;  . EP IMPLANTABLE DEVICE N/A 07/11/2015   Procedure: Pacemaker Implant;  Surgeon: [name], MD;  Location: [place] [facility];  Service: Cardiovascular;  Laterality: N/A;  . ESOPHAGOGASTRODUODENOSCOPY  08/01/2012   Procedure: ESOPHAGOGASTRODUODENOSCOPY (EGD);  Surgeon: [name], MD;  Location: [facility];  Service: Endoscopy;  Laterality: N/A;  . INSERT / REPLACE / REMOVE PACEMAKER  07/11/2015  . INSERTION OF DIALYSIS CATHETER Right 02/02/2015   Procedure: INSERTION OF DIALYSIS CATHETER  RIGHT INTERNAL JUGULAR;  Surgeon: Mal [name], MD;  Location: [place];  Service: Vascular;  Laterality: Right;  . LEFT AND RIGHT HEART CATHETERIZATION WITH CORONARY ANGIOGRAM N/A 09/30/2014   Procedure: LEFT AND RIGHT HEART CATHETERIZATION WITH CORONARY ANGIOGRAM;  Surgeon: [name], MD;  Location: [facility] C[facility];  Service: Cardiovascular;  Laterality: N/A;  . LEFT HEART CATH AND CORONARY ANGIOGRAPHY N/A 04/14/2018   Procedure: LEFT HEART CATH AND CORONARY ANGIOGRAPHY;  Surgeon: [name], MD;  Location: [place] [facility];  Service: Cardiovascular;  Laterality: N/A;  . TEE WITHOUT CARDIOVERSION N/A 08/02/2015   Procedure: TRANSESOPHAGEAL ECHOCARDIOGRAM (TEE);  Surgeon: [name], MD;  Location: [place];  Service: Open Heart Surgery;  Laterality: N/A;  . TONSILLECTOMY    . TRANSCATHETER AORTIC VALVE REPLACEMENT, TRANSFEMORAL Left 08/02/2015   Procedure: TRANSCATHETER AORTIC VALVE REPLACEMENT, TRANSFEMORAL;  Surgeon: [name], MD;  Location: [place];  Service: Open Heart Surgery;  Laterality: Left;    Allergies  Allergen Reactions  . Byetta 10 Mcg Pen  [Exenatide] Diarrhea and Nausea And Vomiting  . Codeine Itching  . Coumadin [Warfarin Sodium] Rash    (wife states coumadin was stopped but rash did not disappear 02/21/19)    Prior to Admission medications   Medication Sig Start Date End Date Taking? Authorizing Provider  acetaminophen (TYLENOL) 500 MG tablet Take 500 mg by mouth every 6 (six) hours as needed for headache (pain).   Yes [provider]  albuterol (PROVENTIL HFA;VENTOLIN HFA) 108 (90 Base) MCG/ACT inhaler Inhale 2 puffs into the lungs every 4 (four) hours as needed for wheezing or shortness of breath. 11/27/18  Yes Tanda Rockers, MD  albuterol (PROVENTIL) (2.5 MG/3ML) 0.083% nebulizer solution Take 3 mLs (2.5 mg total) by nebulization every 6 (six) hours as needed for wheezing or shortness of breath. 01/07/19  Yes Scot Jun, FNP  ALPRAZolam Duanne Moron) 0.25 MG tablet Take 1 tablet (0.25 mg total) by mouth 2 (two) times daily as needed for anxiety. 02/24/19  Yes Norval Morton, MD  apixaban (ELIQUIS) 2.5 MG TABS tablet Take 1 tablet (2.5 mg total) by mouth 2 (two) times daily. 02/24/19  Yes Fuller Plan A, MD  atorvastatin (LIPITOR) 80 MG tablet Take 1 tablet (80 mg total) by mouth daily at 6 PM. Patient taking differently:  Take 80 mg by mouth every evening.  01/07/19  Yes Scot Jun, FNP  budesonide-formoterol [facility]) 160-4.5 MCG/ACT inhaler Inhale 2 puffs into the lungs 2 (two) times daily. Patient taking differently: Inhale 2 puffs into the lungs 2 (two) times daily as needed (asthma).  11/27/18  Yes Tanda Rockers, MD  cinacalcet (SENSIPAR) 30 MG tablet Take 30 mg by mouth See admin instructions. Take one tablet (30 mg) by mouth on Monday, Tuesday, Thursday, Saturday after dialysis   Yes [provider]  clopidogrel (PLAVIX) 75 MG tablet Take 1 tablet (75 mg total) by mouth daily. 09/02/18  Yes Martinique, Peter M, MD  doxercalciferol (HECTOROL) 4 MCG/2ML injection Inject 0.5 mLs (1 mcg total) into  the vein Every Tuesday,Thursday,and Saturday with dialysis. Patient taking differently: Inject 1 mcg into the vein See admin instructions. At dialysis 02/26/19  Yes Norval Morton, MD  guaiFENesin (MUCINEX) 600 MG 12 hr tablet Take 1 tablet (600 mg total) by mouth 2 (two) times daily. Patient taking differently: Take 600 mg by mouth 2 (two) times daily as needed for cough or to loosen phlegm.  01/07/19  Yes Scot Jun, FNP  hydrOXYzine (ATARAX/VISTARIL) 25 MG tablet Take 0.5-1 tablets (12.5-25 mg total) by mouth 2 (two) times daily as needed for itching. 01/07/19  Yes Scot Jun, FNP  insulin detemir (LEVEMIR) 100 UNIT/ML injection Inject 0.28 mLs (28 Units total) into the skin at bedtime. 02/24/19  Yes Smith, Rondell A, MD  Insulin Pen Needle 31G X 6 MM MISC at bedtime.  08/28/18  Yes [provider]  lidocaine-prilocaine (EMLA) cream Apply 1 application topically See admin instructions. Apply topically prior to dialysis (Monday, Tuesday, Thursday, Saturday)   Yes [provider]  loratadine (CLARITIN) 10 MG tablet Take 10 mg by mouth daily.    Yes [provider]  Melatonin 5 MG TABS Take 5-10 mg by mouth at bedtime as needed (sleep).    Yes [provider]  metoprolol tartrate (LOPRESSOR) 25 MG tablet Take 0.5 tablets (12.5 mg total) by mouth See admin instructions. Take 1/2 tablet (12.5 mg) by mouth on Sunday, Monday, Wednesday, Friday mornings (non-dialysis days), take 1/2 tablet (12.5 mg) every evening Patient taking differently: Take 12.5 mg by mouth See admin instructions. Take 1/2 tablet (12.5 mg) by mouth on Sunday, Wednesday, Friday mornings (non-dialysis days), take 1/2 tablet (12.5 mg) every evening 03/04/19  Yes Scot Jun, FNP  midodrine (PROAMATINE) 10 MG tablet Take 10 mg by mouth See admin instructions. Take one tablet (10 mg) by mouth 30 minutes before dialysis on Monday Tuesday, Thursday, Saturday 02/05/19  Yes [provider]  midodrine (PROAMATINE) 2.5 MG tablet Take 1 tablet (2.5 mg total) by mouth 3 (three) times daily with meals. Patient should continue to take 10 mg in the morning prior to hemodialysis sessions. Patient taking differently: Take 2.5 mg by mouth See admin instructions. Three times daily except on the mornings of dialysis. 02/25/19  Yes Fuller Plan A, MD  nitroGLYCERIN (NITROSTAT) 0.4 MG SL tablet Place 1 tablet (0.4 mg total) under the tongue every 5 (five) minutes as needed for chest pain. 04/19/18  Yes Rai, Ripudeep K, MD  OXYGEN Inhale 2-3 L into the lungs continuous. 2-3 lpm 24/7    Yes [provider]  pantoprazole (PROTONIX) 40 MG tablet Take 1 tablet (40 mg total) by mouth daily. Patient taking differently: Take 40 mg by mouth every evening.  08/29/18  Yes Deboraha Sprang,  MD  sevelamer carbonate (RENVELA) 800 MG tablet Take 1,600-2,400 mg by mouth See admin instructions. Take 3 tablets (2400 mg) by mouth with meals and 2 tablets (1600 mg) with snacks   Yes [provider]  Skin Protectants, Misc. (EUCERIN) cream Apply 1 application topically at bedtime.   Yes [provider]    Social History   Socioeconomic History  . Marital status: Married    Spouse name: Not on file  . Number of children: 3  . Years of education: Not on file  . Highest education level: Not on file  Occupational History  . Occupation: retired  Scientific laboratory technician  . Financial resource strain: Not on file  . Food insecurity:    Worry: Not on file    Inability: Not on file  . Transportation needs:    Medical: Not on file    Non-medical: Not on file  Tobacco Use  . Smoking status: Former Smoker    Packs/day: 2.00    Years: 32.00    Pack years: 64.00    Last attempt to quit: 11/27/1983    Years since quitting: 35.3  . Smokeless tobacco: Never Used  Substance and Sexual Activity  . Alcohol use: Yes    Comment: rarely  . Drug use: No  . Sexual activity: Yes    Birth control/protection:  None  Lifestyle  . Physical activity:    Days per week: Not on file    Minutes per session: Not on file  . Stress: Not on file  Relationships  . Social connections:    Talks on phone: Not on file    Gets together: Not on file    Attends religious service: Not on file    Active member of club or organization: Not on file    Attends meetings of clubs or organizations: Not on file    Relationship status: Not on file  . Intimate partner violence:    Fear of current or ex partner: Not on file    Emotionally abused: Not on file    Physically abused: Not on file    Forced sexual activity: Not on file  Other Topics Concern  . Not on file  Social History Narrative  . Not on file     Family History  Problem Relation Age of Onset  . Diabetes Father   . Heart disease Father   . Diabetes Sister   . Alzheimer's disease Mother   . Stroke Mother   . Diabetes Paternal Grandmother   . Alzheimer's disease Maternal Aunt        x 8 Maternal Aunts  . Cancer Maternal Uncle        type unknown    ROS: [x]  Positive   [ ]  Negative   [ ]  All sytems reviewed and are negative  Cardiovascular: []  chest pain/pressure []  palpitations []  SOB lying flat []  DOE []  pain in legs while walking []  pain in legs at rest [x]  pain in legs at night (left) []  non-healing ulcers []  hx of DVT []  swelling in legs  Pulmonary: []  productive cough []  asthma/wheezing []  home O2  Neurologic: []  weakness in []  arms []  legs []  numbness in []  arms []  legs []  hx of CVA []  mini stroke [] difficulty speaking or slurred speech []  temporary loss of vision in one eye []  dizziness  Hematologic: []  hx of cancer []  bleeding problems []  problems with blood clotting easily  Endocrine:   []  diabetes []  thyroid disease  GI []   vomiting blood []  blood in stool  GU: []  CKD/renal failure []  HD--[]  M/W/F or []  T/T/S []  burning with urination []  blood in urine  Psychiatric: []  anxiety []  depression   Musculoskeletal: []  arthritis []  joint pain  Integumentary: []  rashes []  ulcers  Constitutional: []  fever []  chills   Physical Examination  Vitals:   04/10/19 1628 04/10/19 1710  BP:  (!) 90/58  Pulse:    Resp:    Temp: 98.3 F (36.8 C) 98.2 F (36.8 C)  SpO2:  100%   Body mass index is 31.12 kg/m.  General:  NAD Gait: Not observed HENT: WNL, normocephalic Pulmonary: normal non-labored breathing, without Rales, rhonchi,  wheezing Cardiac: regular, without  Murmurs, rubs or gallops Abdomen: soft, NT/ND, no masses Vascular Exam/Pulses:  Right Left  Radial    Ulnar    Femoral 2+ (normal) 2+ (normal)  Popliteal    DP Biphasic Monophasic  PT Biphasic Biphasic   Extremities: Ischemic changes with dry gangrene as documented below in image Musculoskeletal: no muscle wasting or atrophy  Neurologic: A&O X 3; Appropriate Affect ; SENSATION: normal; MOTOR FUNCTION:  moving all extremities equally. Speech is fluent/normal       CBC    Component Value Date/Time   WBC 10.7 (H) 04/10/2019 1400   RBC 3.95 (L) 04/10/2019 1400   HGB 11.3 (L) 04/10/2019 1400   HGB 9.2 (L) 01/07/2019 1108   HCT 36.2 (L) 04/10/2019 1400   HCT 29.6 (L) 01/07/2019 1108   PLT 215 04/10/2019 1400   PLT 211 01/07/2019 1108   MCV 91.6 04/10/2019 1400   MCV 93 01/07/2019 1108   MCH 28.6 04/10/2019 1400   MCHC 31.2 04/10/2019 1400   RDW 16.5 (H) 04/10/2019 1400   RDW 17.3 (H) 01/07/2019 1108   LYMPHSABS 1.0 04/10/2019 1400   LYMPHSABS 1.4 01/07/2019 1108   MONOABS 1.2 (H) 04/10/2019 1400   EOSABS 0.5 04/10/2019 1400   EOSABS 0.1 01/07/2019 1108   BASOSABS 0.1 04/10/2019 1400   BASOSABS 0.0 01/07/2019 1108    BMET    Component Value Date/Time   NA 132 (L) 04/10/2019 1305   NA 137 01/07/2019 1108   K 5.0 04/10/2019 1305   CL 91 (L) 04/10/2019 1305   CO2 24 04/10/2019 1305   GLUCOSE 222 (H) 04/10/2019 1305   BUN 26 (H) 04/10/2019 1305   BUN 29 (H) 01/07/2019 1108   CREATININE  3.72 (H) 04/10/2019 1305   CALCIUM 9.0 04/10/2019 1305   GFRNONAA 14 (L) 04/10/2019 1305   GFRAA 17 (L) 04/10/2019 1305    COAGS: Lab Results  Component Value Date   INR 2.4 (H) 02/20/2019   INR 2.5 12/19/2017   INR 2.2 11/21/2017     Non-Invasive Vascular Imaging:    I reviewed his recent arteriogram and indeed his left SFA and popliteal are heavily calcified but patent without any flow-limiting stenosis.  He has inline flow into the foot via the posterior tibial that is calcified and rather small.  The peroneal appears to be the dominant runoff.  The anterior tibial is also patent but has multilevel high-grade stenosis.   ASSESSMENT/PLAN: This is a 78 y.o. male with multiple medical problems including ESRD and DM as documented above.  He has had progression of tissue loss on his left foot since undergoing arteriogram with Dr. Scot Dock on 03/27/2019.  In evaluating his options, we discussed primary TMA but I think he is at exceedingly high risk for wound failure given his underlying diabetes  and end-stage renal disease.  Given that he spends most of his time in a wheelchair and is nonambulatory except for transfers/pivoting if he wanted to proceed with surgery that would have the lowest risk for complication/wound failure would likely be about above-the-knee amputation.  He will continue to think about his options and Dr. Oneida Alar will see him this weekend.   Marty Heck, MD [facility] Office: [phone] Pager: [place]

## 2019-04-10 NOTE — Progress Notes (Addendum)
Pharmacy Antibiotic Note  Jeffrey [name] is a 78 y.o. male admitted on 04/10/2019 with left found wound.  Pharmacy has been consulted for ceftazidime/vancomycin dosing. Patient has ESRD, outpatient dialysis Tu/Th/Sat, last session 5/14. Afebrile, WBC 10.7.   Plan: Vancomycin 2000 mg IV x1, then 1000 mg IV q dialysis session Ceftazidime 1 g IV q24h  Monitor clinical status, hemodialysis schedule, cultures, length of therapy Vancomycin levels as indicated   Weight: 216 lb 0.8 oz (98 kg)  Temp (24hrs), Avg:98.1 F (36.7 C), Min:97.9 F (36.6 C), Max:98.2 F (36.8 C)  Recent Labs  Lab 04/10/19 1305 04/10/19 1400  WBC  --  10.7*  CREATININE 3.72*  --     Estimated Creatinine Clearance: 18.9 mL/min (A) (by C-G formula based on SCr of 3.72 mg/dL (H)).    Allergies  Allergen Reactions  . Byetta 10 Mcg Pen [Exenatide] Diarrhea and Nausea And Vomiting  . Codeine Itching  . Coumadin [Warfarin Sodium] Rash    (wife states coumadin was stopped but rash did not disappear 02/21/19)    Antimicrobials this admission: Vancomycin 5/15 >> Ceftazidime 5/15 >>  Dose adjustments this admission:   Microbiology results: 5/15 BCx: 5/15 COVID: negative    Thank you for allowing pharmacy to be a part of this patient's care.   Claiborne Billings, PharmD PGY2 Cardiology Pharmacy Resident Please check AMION for all Pharmacist numbers by unit 04/10/2019 2:50 PM

## 2019-04-10 NOTE — Social Work (Signed)
CSW acknowledging consult for access to medications at discharge.  For medication access please consult RN Case Management.   CSW signing off. Please consult if any additional needs arise.  Alayja Armas, MSW, LCSWA [place] Clinical Social Work [phone]     

## 2019-04-10 NOTE — ED Triage Notes (Signed)
Pt is dialysis pt who has left foot wound with eschar and wet gangrene. Pic in chart. Pt had dialysis yesterday

## 2019-04-10 NOTE — ED Notes (Signed)
Nurse Navigator communication: Wife [name] given updates by the patient. She has went home instead of waiting in the parking lot. She would like to be updated with plan of care once we have more information.

## 2019-04-11 LAB — BASIC METABOLIC PANEL
Anion gap: 17 — ABNORMAL HIGH (ref 5–15)
BUN: 33 mg/dL — ABNORMAL HIGH (ref 8–23)
CO2: 26 mmol/L (ref 22–32)
Calcium: 9.2 mg/dL (ref 8.9–10.3)
Chloride: 89 mmol/L — ABNORMAL LOW (ref 98–111)
Creatinine, Ser: 4.47 mg/dL — ABNORMAL HIGH (ref 0.61–1.24)
GFR calc Af Amer: 13 mL/min — ABNORMAL LOW (ref >60)
GFR calc non Af Amer: 12 mL/min — ABNORMAL LOW (ref >60)
Glucose, Bld: 191 mg/dL — ABNORMAL HIGH (ref 70–99)
Potassium: 5.1 mmol/L (ref 3.5–5.1)
Sodium: 132 mmol/L — ABNORMAL LOW (ref 135–145)

## 2019-04-11 LAB — CBC
HCT: 35.9 % — ABNORMAL LOW (ref 39.0–52.0)
Hemoglobin: 11.2 g/dL — ABNORMAL LOW (ref 13.0–17.0)
MCH: 28.4 pg (ref 26.0–34.0)
MCHC: 31.2 g/dL (ref 30.0–36.0)
MCV: 91.1 fL (ref 80.0–100.0)
Platelets: 200 10*3/uL (ref 150–400)
RBC: 3.94 MIL/uL — ABNORMAL LOW (ref 4.22–5.81)
RDW: 16.4 % — ABNORMAL HIGH (ref 11.5–15.5)
WBC: 10.4 10*3/uL (ref 4.0–10.5)
nRBC: 0 % (ref 0.0–0.2)

## 2019-04-11 LAB — PREALBUMIN: Prealbumin: 9.1 mg/dL — ABNORMAL LOW (ref 18–38)

## 2019-04-11 LAB — TROPONIN I: Troponin I: 0.61 ng/mL (ref <0.03)

## 2019-04-11 LAB — APTT: aPTT: 42 seconds — ABNORMAL HIGH (ref 24–36)

## 2019-04-11 LAB — HEMOGLOBIN A1C
Hgb A1c MFr Bld: 8.4 % — ABNORMAL HIGH (ref 4.8–5.6)
Mean Plasma Glucose: 194.38 mg/dL

## 2019-04-11 LAB — HEPARIN LEVEL (UNFRACTIONATED): Heparin Unfractionated: 1.3 IU/mL — ABNORMAL HIGH (ref 0.30–0.70)

## 2019-04-11 LAB — C-REACTIVE PROTEIN: CRP: 10.1 mg/dL — ABNORMAL HIGH (ref <1.0)

## 2019-04-11 LAB — GLUCOSE, CAPILLARY
Glucose-Capillary: 185 mg/dL — ABNORMAL HIGH (ref 70–99)
Glucose-Capillary: 155 mg/dL — ABNORMAL HIGH (ref 70–99)
Glucose-Capillary: 75 mg/dL (ref 70–99)

## 2019-04-11 MED ORDER — DOXERCALCIFEROL 4 MCG/2ML IV SOLN
INTRAVENOUS | Status: AC
  Administered 2019-04-11: 1 ug via INTRAVENOUS
  Filled 2019-04-11: qty 2

## 2019-04-11 MED ORDER — INSULIN DETEMIR 100 UNIT/ML ~~LOC~~ SOLN
18.0000 [IU] | Freq: Every day | SUBCUTANEOUS | Status: DC
  Administered 2019-04-11 – 2019-04-18 (×7): 18 [IU] via SUBCUTANEOUS
  Filled 2019-04-11 (×9): qty 0.18

## 2019-04-11 MED ORDER — VANCOMYCIN HCL IN DEXTROSE 1-5 GM/200ML-% IV SOLN
INTRAVENOUS | Status: AC
  Administered 2019-04-11: 1000 mg via INTRAVENOUS
  Filled 2019-04-11: qty 200

## 2019-04-11 MED ORDER — CHLORHEXIDINE GLUCONATE CLOTH 2 % EX PADS: 6.0000 | MEDICATED_PAD | Freq: Every day | CUTANEOUS | Status: DC

## 2019-04-11 MED ORDER — HEPARIN (PORCINE) 25000 UT/250ML-% IV SOLN
1400.0000 [IU]/h | INTRAVENOUS | Status: DC
  Administered 2019-04-11: 1400 [IU]/h via INTRAVENOUS
  Administered 2019-04-12: 1600 [IU]/h via INTRAVENOUS
  Filled 2019-04-11 (×3): qty 250

## 2019-04-11 NOTE — Progress Notes (Signed)
PROGRESS NOTE    TALEN POSER  XBL:[id number] DOB: 03/18/1941 DOA: 04/10/2019 PCP: Scot Jun, FNP    Brief Narrative:   Jeffrey [name] is a 78 y.o. male with medical history significant of CHF, s/p pacemaker placement, s/p TAVR, A. fib on apixaban, CAD s/p stenting, ESRD on HD, DM type 2, PVD, and anemia; who presents with complaints of worsening left foot pain.  Admitted in March for cellulitis of the same foot, but reports that has not been getting better.  At this time he reports that he is wheelchair-bound and his son and wife help him with transfers at home.  He had an abdominal aortogram along with the lower extremities performed by Dr. Scot Dock on 5/1.  Based off of that study he was thought to have reasonable flow to the left foot with real good options for revascularization.  At that time he felt that his toes were similar to now with dark appearance of the second, third, and fourth toe.  He was advised to wash foot daily and keep it clean which he has done.  However, over the last 14 days he has developed progressively worsening pain that he describes as sharp and shooting.  He has been trying to take Tylenol without any relief of symptoms.  Denies having any significant fever, chills, abdominal pain, nausea, vomiting, or diarrhea.  He last had hemodialysis yesterday.   ED Course: On admission to the emergency department patient was noted to be afebrile, pulse 51-60, respirations 14-23, blood pressure 93/48-147/129, and O2 saturations maintained on 2 L nasal cannula oxygen.  Labs revealed WBC 10.7, hemoglobin 11.3, sodium 132, potassium 5, BUN 26, creatinine 3.72, glucose 222, lactic acid 2.1 ->2.2, and troponin  0.63.  X-rays revealed erosive osseous changes of the fourth digit left foot concerning for osteomyelitis.  Blood cultures were obtained.  Patient was given empiric antibiotics of vancomycin and Fortaz.   Assessment & Plan:   Active Problems:   Diabetes  type 2, controlled ([place])   ESRD (end stage renal disease) on dialysis (H[facility])   Anemia of chronic disease   Automatic implantable cardioverter-defibrillator in situ   Chronic respiratory failure with hypoxia ([facility]CC)   Troponin level elevated   Osteomyelitis ([facility])   Sepsis, present on admission Left foot osteomyelitis with gangrene Patient presenting with progressive left foot pain with associated skin changes.  Noted to have elevated WBC count of 10.7, respirations 23, lactic acid 2.1 on admission.  CRP 10.1. Afebrile.  X-ray left foot notable for progressive osseous erosions concerning for osteomyelitis at the fourth distal phalanx. --Vascular surgery consulted, and following appreciate assistance --Continue empiric antibiotics with vancomycin and Fortaz, pharmacy consulted for assistance in dosing/monitoring --Pain control with Norco --Continue n.p.o. for possible surgical intervention today, waiting vascular surgery evaluation today --Given his comorbidities and immobility status, vascular surgery prefers BKA rather than TMA at this point.  Permanent atrial fibrillation History of PPM placement.  On Eliquis and metoprolol outpatient. --Hold Eliquis in anticipation of surgical intervention, started heparin drip in the perioperative setting given elevated CHADS-VASc score of 6 --Continue rate control with metoprolol --Continue to monitor on telemetry  ESRD on HD [facility] in [facility] on a Tuesday, Thursday, Saturday schedule. --Nephrology consulted for assistance with continued hemodialysis while inpatient  Type 2 diabetes mellitus Patient's home regimen includes Levemir 28 units nightly.  Patient states insulin dose recently reduced by PCP.  Previous hemoglobin A1c 6.3 on 01/07/2019. --Hemoglobin A1c  up to 8.4, poorly controlled --We will restart home Levemir at reduced dose of 18 units daily while NPO --CBGs q. 4hrs while NPO --moderate SSI --Hypoglycemic protocol  Peripheral  vascular disease --Plavix currently held due to possible need of surgical procedure  Hypotension: Chronic.  Patient notes systolic blood pressures normally will be in the 80s.  Maps currently greater than 65. --Continue midodrine  Anemia of chronic disease:  Hemoglobin 11.3 g/dL.  Patient does not report any acute bleeding. --Follow CBC  COPD, without exacerbation chronic respiratory failure with hypoxia --Continue home 2 L of nasal cannula oxygen --Pharmacy substitution of Dulera for Symbicort --Albuterol nebs as needed for shortness of breath/wheezing  Aortic stenosis s/p TAVR: Stable  Anxiety --Continue Xanax as needed anxiety   DVT prophylaxis: SCDs, heparin drip Code Status: Full code Family Communication: None Disposition Plan: Continue inpatient hospitalization, pending surgical intervention, further depending on clinical course   Consultants:   Vascular surgery  Procedures:   None  Antimicrobials:   Vancomycin 5/15>>  Fortaz 5/15>>   Subjective: Patient seen and examined at bedside, complains of pain to the left foot.  Understands that he needs surgical intervention.  Awaiting follow-up with vascular surgery today.  Likely will need BKA rather than TMA given his poor mobility status and significant comorbidities concerning for wound healing.  No other complaints at this time.  Denies headache, no fever/chills/night sweats, no chest pain, no palpitations, no abdominal pain, no issues with bowel/bladder function.  No acute events overnight per nursing staff.  Objective: Vitals:   04/10/19 2108 04/11/19 0445 04/11/19 0850 04/11/19 0854  BP: 107/63 104/62  113/60  Pulse: (!) 56 (!) 56  (!) 58  Resp: 18 18  18   Temp: 98.2 F (36.8 C) 98.2 F (36.8 C)  98.6 F (37 C)  TempSrc: Oral Oral  Oral  SpO2: 99% 100% 100% 100%  Weight:      Height:        Intake/Output Summary (Last 24 hours) at 04/11/2019 1441 Last data filed at 04/11/2019 1300 Gross per  24 hour  Intake 1392.03 ml  Output -  Net 1392.03 ml   Filed Weights   04/10/19 1419 04/10/19 1733  Weight: 98 kg 101.2 kg    Examination:  General exam: Appears calm and comfortable  Respiratory system: Clear to auscultation. Respiratory effort normal.  On 2 L nasal cannula which is his home dose Cardiovascular system: S1 & S2 heard, RRR. No JVD, murmurs, rubs, gallops or clicks. No pedal edema. Gastrointestinal system: Abdomen is nondistended, soft and nontender. No organomegaly or masses felt. Normal bowel sounds heard. Central nervous system: Alert and oriented. No focal neurological deficits. Extremities: Symmetric 5 x 5 power. Skin: Necrotic black appearance of dorsal aspect of 2nd, 3rd, 4th,and part of 5th toe with surrounding erythema Psychiatry: Judgement and insight appear normal. Mood & affect appropriate.     Data Reviewed: I have personally reviewed following labs and imaging studies  CBC: Recent Labs  Lab 04/10/19 1400 04/11/19 0514  WBC 10.7* 10.4  NEUTROABS 7.9*  --   HGB 11.3* 11.2*  HCT 36.2* 35.9*  MCV 91.6 91.1  PLT 215 536   Basic Metabolic Panel: Recent Labs  Lab 04/10/19 1305 04/11/19 0514  NA 132* 132*  K 5.0 5.1  CL 91* 89*  CO2 24 26  GLUCOSE 222* 191*  BUN 26* 33*  CREATININE 3.72* 4.47*  CALCIUM 9.0 9.2   GFR: Estimated Creatinine Clearance: 16 mL/min (A) (by C-G formula  based on SCr of 4.47 mg/dL (H)). Liver Function Tests: Recent Labs  Lab 04/10/19 1305  AST 26  ALT 18  ALKPHOS 134*  BILITOT 1.8*  PROT 6.5  ALBUMIN 2.9*   No results for input(s): LIPASE, AMYLASE in the last 168 hours. No results for input(s): AMMONIA in the last 168 hours. Coagulation Profile: No results for input(s): INR, PROTIME in the last 168 hours. Cardiac Enzymes: Recent Labs  Lab 04/10/19 1305 04/11/19 0514  TROPONINI 0.63* 0.61*   BNP (last 3 results) No results for input(s): PROBNP in the last 8760 hours. HbA1C: Recent Labs     04/11/19 0514  HGBA1C 8.4*   CBG: Recent Labs  Lab 04/10/19 1707 04/10/19 2202 04/11/19 0643 04/11/19 1133  GLUCAP 178* 209* 185* 155*   Lipid Profile: No results for input(s): CHOL, HDL, LDLCALC, TRIG, CHOLHDL, LDLDIRECT in the last 72 hours. Thyroid Function Tests: No results for input(s): TSH, T4TOTAL, FREET4, T3FREE, THYROIDAB in the last 72 hours. Anemia Panel: No results for input(s): VITAMINB12, FOLATE, FERRITIN, TIBC, IRON, RETICCTPCT in the last 72 hours. Sepsis Labs: Recent Labs  Lab 04/10/19 1430 04/10/19 1534  LATICACIDVEN 2.1* 2.2*    Recent Results (from the past 240 hour(s))  SARS Coronavirus 2 (CEPHEID - Performed in [name] hospital lab), Hosp Order     Status: None   Collection Time: 04/10/19 12:56 PM  Result Value Ref Range Status   SARS Coronavirus 2 NEGATIVE NEGATIVE Final    Comment: (NOTE) If result is NEGATIVE SARS-CoV-2 target nucleic acids are NOT DETECTED. The SARS-CoV-2 RNA is generally detectable in upper and lower  respiratory specimens during the acute phase of infection. The lowest  concentration of SARS-CoV-2 viral copies this assay can detect is 250  copies / mL. A negative result does not preclude SARS-CoV-2 infection  and should not be used as the sole basis for treatment or other  patient management decisions.  A negative result may occur with  improper specimen collection / handling, submission of specimen other  than nasopharyngeal swab, presence of viral mutation(s) within the  areas targeted by this assay, and inadequate number of viral copies  (<250 copies / mL). A negative result must be combined with clinical  observations, patient history, and epidemiological information. If result is POSITIVE SARS-CoV-2 target nucleic acids are DETECTED. The SARS-CoV-2 RNA is generally detectable in upper and lower  respiratory specimens dur ing the acute phase of infection.  Positive  results are indicative of active infection with  SARS-CoV-2.  Clinical  correlation with patient history and other diagnostic information is  necessary to determine patient infection status.  Positive results do  not rule out bacterial infection or co-infection with other viruses. If result is PRESUMPTIVE POSTIVE SARS-CoV-2 nucleic acids MAY BE PRESENT.   A presumptive positive result was obtained on the submitted specimen  and confirmed on repeat testing.  While 2019 novel coronavirus  (SARS-CoV-2) nucleic acids may be present in the submitted sample  additional confirmatory testing may be necessary for epidemiological  and / or clinical management purposes  to differentiate between  SARS-CoV-2 and other Sarbecovirus currently known to infect humans.  If clinically indicated additional testing with an alternate test  methodology [phone]) is advised. The SARS-CoV-2 RNA is generally  detectable in upper and lower respiratory sp ecimens during the acute  phase of infection. The expected result is Negative. Fact Sheet for Patients:  StrictlyIdeas.no Fact Sheet for Healthcare Providers: BankingDealers.co.za This test is not yet approved or  cleared by the [place] and has been authorized for detection and/or diagnosis of SARS-CoV-2 by FDA under an Emergency Use Authorization (EUA).  This EUA will remain in effect (meaning this test can be used) for the duration of the COVID-19 declaration under Section 564(b)(1) of the Act, 21 U.S.C. section 360bbb-3(b)(1), unless the authorization is terminated or revoked sooner. Performed at [facility], [place] [address], [place], [place] [address]   Culture, blood (routine x 2)     Status: None (Preliminary result)   Collection Time: 04/10/19  2:04 PM  Result Value Ref Range Status   Specimen Description BLOOD RIGHT ANTECUBITAL  Final   Special Requests   Final    AEROBIC BOTTLE ONLY Blood Culture results may not be optimal due to an  excessive volume of blood received in culture bottles   Culture   Final    NO GROWTH < 24 HOURS Performed at [facility], [place] [address] [place], [place] [address]    Report Status PENDING  Incomplete  MRSA PCR Screening     Status: None   Collection Time: 04/10/19  5:17 PM  Result Value Ref Range Status   MRSA by PCR NEGATIVE NEGATIVE Final    Comment:        The GeneXpert MRSA Assay (FDA approved for NASAL specimens only), is one component of a comprehensive MRSA colonization surveillance program. It is not intended to diagnose MRSA infection nor to guide or monitor treatment for MRSA infections. Performed at [facility], [place] [address] [place], [place] [address]   Culture, blood (routine x 2)     Status: None (Preliminary result)   Collection Time: 04/10/19  5:54 PM  Result Value Ref Range Status   Specimen Description BLOOD RIGHT HAND  Final   Special Requests   Final    BOTTLES DRAWN AEROBIC AND ANAEROBIC Blood Culture adequate volume   Culture   Final    NO GROWTH < 24 HOURS Performed at [facility], [place] [address], [place], [place] [address]    Report Status PENDING  Incomplete         Radiology Studies: Dg Chest 2 View  Result Date: 04/10/2019 CLINICAL DATA:  Black toes, pain, shortness of breath EXAM: CHEST - 2 VIEW COMPARISON:  02/20/2019 FINDINGS: Cardiomegaly status post aortic valve stent endograft repair. Right chest multi lead pacer. There is minimal, diffuse interstitial pulmonary opacity bilaterally without focal airspace abnormality. The visualized skeletal structures are unremarkable. IMPRESSION: Cardiomegaly with minimal, diffuse interstitial pulmonary opacity, which may reflect minimal edema or chronic interstitial change. There is no focal airspace opacity. Electronically Signed   By: Eddie Candle M.D.   On: 04/10/2019 14:17   Dg Foot Complete Left  Result Date: 04/10/2019 CLINICAL DATA:  Initial evaluation for acute pain,  osteomyelitis. EXAM: LEFT FOOT - COMPLETE 3+ VIEW COMPARISON:  None. FINDINGS: No acute fracture or dislocation. Scattered osteoarthritic changes present throughout the midfoot. Subtle progressive osseous erosion at the right fourth distal phalanx as compared to previous exam, suspicious for possible osteomyelitis. Soft tissue ulceration overlies the great toe. Subtle erosive changes at the underlying right first metatarsal head also suspicious for possible osteomyelitis. No other definite osseous erosions. Calcified atherosclerosis. IMPRESSION: 1. Subtle progressive osseous erosion at the right fourth distal phalanx as compared to previous exam, suspicious for osteomyelitis. 2. Soft tissue ulceration overlying the right great toe. Erosive changes involving the underlying right first metatarsal head also suspicious  for osteomyelitis. 3. Extensive atherosclerosis. Electronically Signed   By: Jeannine Boga M.D.   On: 04/10/2019 14:20        Scheduled Meds: . atorvastatin  80 mg Oral QPM  . Chlorhexidine Gluconate Cloth  6 each Topical Q0600  . cinacalcet  30 mg Oral Q T,Th,Sat-1800  . doxercalciferol  1 mcg Intravenous Q T,Th,Sa-HD  . insulin aspart  0-15 Units Subcutaneous TID WC  . insulin aspart  0-5 Units Subcutaneous QHS  . insulin detemir  18 Units Subcutaneous Daily  . lidocaine-prilocaine  1 application Topical See admin instructions  . metoprolol tartrate  12.5 mg Oral 2 times per day on Sun Mon Wed Fri  . midodrine  10 mg Oral Q T,Th,S,Su  . midodrine  2.5 mg Oral TID WC  . mometasone-formoterol  2 puff Inhalation BID  . pantoprazole  40 mg Oral QPM  . sevelamer carbonate  2,400 mg Oral TID WC  . sodium chloride flush  3 mL Intravenous Q12H   Continuous Infusions: . cefTAZidime (FORTAZ)  IV    . heparin 1,400 Units/hr (04/11/19 1156)  . vancomycin       LOS: 1 day    Time spent: 29 minutes     J [place] DO [facility] Pager [phone]  If 7PM-7AM,  please contact night-coverage [email or web address] Password [facility] 04/11/2019, 2:41 PM

## 2019-04-11 NOTE — Consult Note (Signed)
[facility] Renal Consultation Note    Indication for Consultation:  Management of ESRD/hemodialysis; anemia, hypertension/volume and secondary hyperparathyroidism  HPI: Jeffrey [name] is a 78 y.o. male with ESRD on HD MTThS at [facility]. PMH: DM, CAD/hx TAVR, prior NSTEMIs, COPD, afib on apixaban, s/p PM.    Admitted with L foot gangrene. Foot xray showing erosive changes suggesting osteomyelitis. VVS consulted with plans for amputation. Empiric Vanc/Fortaz started. Blood cultures negative today. CXR with minimal edema.  Labs: Na 132, K 5.1 Ca 9.2, Troponin 0.61 WBC 10.4, Hgb 11.4   Due for routine dialysis today. Last HD Thursday. Completed full treatment and left 1.7kg over dry weight. Low BPs often limit UF. Compliant with HD Rx.   Seen and examined in room. Having significant foot pain. Denies fevers, chills, SOB, CP, N/V/D.   Past Medical History:  Diagnosis Date  . Anginal pain ([place])   . Aortic stenosis    a. severe by echo 09/2014  . Atrial fibrillation ([place])    a. not well documented, not on anticoagulation  . CHF (congestive heart failure) ([place])    04/28/17 echo-EF 40%, mod LVH, diastolic dysfunction  . Claustrophobia   . Complete heart block ([place])   . COPD (chronic obstructive pulmonary disease) ([place])   . Coronary artery disease    a. chronically occluded RCA per cath 09/2014 with collaterals B. cath 05/01/17 chr occ RCA w/collaterals, 60-70% mid LAD,   . CVA (cerebral vascular accident) ([place]) 10/2014   denies residual on 07/11/2015  . ESRD (end stage renal disease) on dialysis [facility])    a. on dialysis; [facility]; MWF, LUE fistula (07/11/2015)  . History of blood transfusion    "related to gallbladder OR"  . History of stomach ulcers   . Hyperlipidemia   . Hypertension   . Iron deficiency anemia   . Myocardial infarction ([place]) 10/2014  . Peripheral vascular disease ([place])   . Pneumonia   . Presence of permanent cardiac pacemaker   . 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 Type II diabetes mellitus ([place])    Past Surgical History:  Procedure Laterality Date  . ABDOMINAL AORTOGRAM W/LOWER EXTREMITY Bilateral 03/27/2019   Procedure: ABDOMINAL AORTOGRAM W/LOWER EXTREMITY;  Surgeon: Angelia Mould, MD;  Location: [place] CV LAB;  Service: Cardiovascular;  Laterality: Bilateral;  . AV FISTULA PLACEMENT Left 10/19/2014   Procedure: BRACHIOCEPHALIC ARTERIOVENOUS (AV) FISTULA CREATION ;  Surgeon: Conrad [place], MD;  Location: [place];  Service: Vascular;  Laterality: Left;  . CARDIAC CATHETERIZATION    . CARDIAC CATHETERIZATION N/A 07/22/2015   Procedure: Right/Left Heart Cath and Coronary Angiography;  Surgeon: [name], MD;  Location: [place] CV LAB;  Service: Cardiovascular;  Laterality: N/A;  . CATARACT EXTRACTION W/ INTRAOCULAR LENS  IMPLANT, BILATERAL Bilateral 1990's  . CHOLECYSTECTOMY OPEN  1980's  . COLONOSCOPY W/ BIOPSIES AND POLYPECTOMY    . CORONARY ANGIOGRAPHY N/A 07/31/2018   Procedure: CORONARY ANGIOGRAPHY;  Surgeon: Martinique, Peter M, MD;  Location: [place] CV LAB;  Service: Cardiovascular;  Laterality: N/A;  . CORONARY ANGIOPLASTY    . CORONARY ATHERECTOMY N/A 04/18/2018   Procedure: CORONARY ATHERECTOMY;  Surgeon: [name], MD;  Location: [place] CV LAB;  Service: Cardiovascular;  Laterality: N/A;  . CORONARY BALLOON ANGIOPLASTY N/A 07/31/2018   Procedure: CORONARY BALLOON ANGIOPLASTY;  Surgeon: Martinique, Peter  M, MD;  Location: [place] CV LAB;  Service: Cardiovascular;  Laterality: N/A;  . CORONARY STENT INTERVENTION N/A 04/18/2018   Procedure: CORONARY STENT INTERVENTION;  Surgeon: [name], MD;  Location: [place] CV LAB;  Service: Cardiovascular;  Laterality: N/A;  . EP IMPLANTABLE DEVICE N/A 07/11/2015   Procedure: Pacemaker Implant;   Surgeon: [name], MD;  Location: [place] CV LAB;  Service: Cardiovascular;  Laterality: N/A;  . ESOPHAGOGASTRODUODENOSCOPY  08/01/2012   Procedure: ESOPHAGOGASTRODUODENOSCOPY (EGD);  Surgeon: [name], MD;  Location: [facility];  Service: Endoscopy;  Laterality: N/A;  . INSERT / REPLACE / REMOVE PACEMAKER  07/11/2015  . INSERTION OF DIALYSIS CATHETER Right 02/02/2015   Procedure: INSERTION OF DIALYSIS CATHETER  RIGHT INTERNAL JUGULAR;  Surgeon: Mal [name], MD;  Location: [place];  Service: Vascular;  Laterality: Right;  . LEFT AND RIGHT HEART CATHETERIZATION WITH CORONARY ANGIOGRAM N/A 09/30/2014   Procedure: LEFT AND RIGHT HEART CATHETERIZATION WITH CORONARY ANGIOGRAM;  Surgeon: [name], MD;  Location: [facility] C[facility] LAB;  Service: Cardiovascular;  Laterality: N/A;  . LEFT HEART CATH AND CORONARY ANGIOGRAPHY N/A 04/14/2018   Procedure: LEFT HEART CATH AND CORONARY ANGIOGRAPHY;  Surgeon: [name], MD;  Location: [place] CV LAB;  Service: Cardiovascular;  Laterality: N/A;  . TEE WITHOUT CARDIOVERSION N/A 08/02/2015   Procedure: TRANSESOPHAGEAL ECHOCARDIOGRAM (TEE);  Surgeon: [name], MD;  Location: [place];  Service: Open Heart Surgery;  Laterality: N/A;  . TONSILLECTOMY    . TRANSCATHETER AORTIC VALVE REPLACEMENT, TRANSFEMORAL Left 08/02/2015   Procedure: TRANSCATHETER AORTIC VALVE REPLACEMENT, TRANSFEMORAL;  Surgeon: [name], MD;  Location: [place];  Service: Open Heart Surgery;  Laterality: Left;   Family History  Problem Relation Age of Onset  . Diabetes Father   . Heart disease Father   . Diabetes Sister   . Alzheimer's disease Mother   . Stroke Mother   . Diabetes Paternal Grandmother   . Alzheimer's disease Maternal Aunt        x 8 Maternal Aunts  . Cancer Maternal Uncle        type unknown   Social History:  reports that he quit smoking about 35 years ago. He has a 64.00 pack-year smoking history. He has never used smokeless tobacco. He reports  current alcohol use. He reports that he does not use drugs. Allergies  Allergen Reactions  . Byetta 10 Mcg Pen [Exenatide] Diarrhea and Nausea And Vomiting  . Codeine Itching  . Coumadin [Warfarin Sodium] Rash    (wife states coumadin was stopped but rash did not disappear 02/21/19)   Prior to Admission medications   Medication Sig Start Date End Date Taking? Authorizing Provider  acetaminophen (TYLENOL) 500 MG tablet Take 500 mg by mouth every 6 (six) hours as needed for headache (pain).   Yes [provider]  albuterol (PROVENTIL HFA;VENTOLIN HFA) 108 (90 Base) MCG/ACT inhaler Inhale 2 puffs into the lungs every 4 (four) hours as needed for wheezing or shortness of breath. 11/27/18  Yes Tanda Rockers, MD  albuterol (PROVENTIL) (2.5 MG/3ML) 0.083% nebulizer solution Take 3 mLs (2.5 mg total) by nebulization every 6 (six) hours as needed for wheezing or shortness of breath. 01/07/19  Yes Scot Jun, FNP  ALPRAZolam Duanne Moron) 0.25 MG tablet Take 1 tablet (0.25 mg total) by mouth 2 (two) times daily as needed for anxiety. 02/24/19  Yes Smith, Delbert Phenix, MD  apixaban (ELIQUIS) 2.5 MG TABS tablet Take 1 tablet (  2.5 mg total) by mouth 2 (two) times daily. 02/24/19  Yes Norval Morton, MD  atorvastatin (LIPITOR) 80 MG tablet Take 1 tablet (80 mg total) by mouth daily at 6 PM. Patient taking differently: Take 80 mg by mouth every evening.  01/07/19  Yes Scot Jun, FNP  budesonide-formoterol [facility]) 160-4.5 MCG/ACT inhaler Inhale 2 puffs into the lungs 2 (two) times daily. Patient taking differently: Inhale 2 puffs into the lungs 2 (two) times daily as needed (asthma).  11/27/18  Yes Tanda Rockers, MD  cinacalcet (SENSIPAR) 30 MG tablet Take 30 mg by mouth See admin instructions. Take one tablet (30 mg) by mouth on Monday, Tuesday, Thursday, Saturday after dialysis   Yes [provider]  clopidogrel (PLAVIX) 75 MG tablet Take 1 tablet (75 mg total) by mouth daily.  09/02/18  Yes Martinique, Peter M, MD  doxercalciferol (HECTOROL) 4 MCG/2ML injection Inject 0.5 mLs (1 mcg total) into the vein Every Tuesday,Thursday,and Saturday with dialysis. Patient taking differently: Inject 1 mcg into the vein See admin instructions. At dialysis 02/26/19  Yes Norval Morton, MD  guaiFENesin (MUCINEX) 600 MG 12 hr tablet Take 1 tablet (600 mg total) by mouth 2 (two) times daily. Patient taking differently: Take 600 mg by mouth 2 (two) times daily as needed for cough or to loosen phlegm.  01/07/19  Yes Scot Jun, FNP  hydrOXYzine (ATARAX/VISTARIL) 25 MG tablet Take 0.5-1 tablets (12.5-25 mg total) by mouth 2 (two) times daily as needed for itching. 01/07/19  Yes Scot Jun, FNP  insulin detemir (LEVEMIR) 100 UNIT/ML injection Inject 0.28 mLs (28 Units total) into the skin at bedtime. 02/24/19  Yes Smith, Rondell A, MD  Insulin Pen Needle 31G X 6 MM MISC at bedtime.  08/28/18  Yes [provider]  lidocaine-prilocaine (EMLA) cream Apply 1 application topically See admin instructions. Apply topically prior to dialysis (Monday, Tuesday, Thursday, Saturday)   Yes [provider]  loratadine (CLARITIN) 10 MG tablet Take 10 mg by mouth daily.    Yes [provider]  Melatonin 5 MG TABS Take 5-10 mg by mouth at bedtime as needed (sleep).    Yes [provider]  metoprolol tartrate (LOPRESSOR) 25 MG tablet Take 0.5 tablets (12.5 mg total) by mouth See admin instructions. Take 1/2 tablet (12.5 mg) by mouth on Sunday, Monday, Wednesday, Friday mornings (non-dialysis days), take 1/2 tablet (12.5 mg) every evening Patient taking differently: Take 12.5 mg by mouth See admin instructions. Take 1/2 tablet (12.5 mg) by mouth on Sunday, Wednesday, Friday mornings (non-dialysis days), take 1/2 tablet (12.5 mg) every evening 03/04/19  Yes Scot Jun, FNP  midodrine (PROAMATINE) 10 MG tablet Take 10 mg by mouth See admin instructions. Take one tablet  (10 mg) by mouth 30 minutes before dialysis on Monday Tuesday, Thursday, Saturday 02/05/19  Yes [provider]  midodrine (PROAMATINE) 2.5 MG tablet Take 1 tablet (2.5 mg total) by mouth 3 (three) times daily with meals. Patient should continue to take 10 mg in the morning prior to hemodialysis sessions. Patient taking differently: Take 2.5 mg by mouth See admin instructions. Three times daily except on the mornings of dialysis. 02/25/19  Yes Fuller Plan A, MD  nitroGLYCERIN (NITROSTAT) 0.4 MG SL tablet Place 1 tablet (0.4 mg total) under the tongue every 5 (five) minutes as needed for chest pain. 04/19/18  Yes Rai, Ripudeep K, MD  OXYGEN Inhale 2-3 L into the lungs continuous. 2-3 lpm 24/7  Yes [provider]  pantoprazole (PROTONIX) 40 MG tablet Take 1 tablet (40 mg total) by mouth daily. Patient taking differently: Take 40 mg by mouth every evening.  08/29/18  Yes Deboraha Sprang, MD  sevelamer carbonate (RENVELA) 800 MG tablet Take 1,600-2,400 mg by mouth See admin instructions. Take 3 tablets (2400 mg) by mouth with meals and 2 tablets (1600 mg) with snacks   Yes [provider]  Skin Protectants, Misc. (EUCERIN) cream Apply 1 application topically at bedtime.   Yes [provider]   Current Facility-Administered Medications  Medication Dose Route Frequency Provider Last Rate Last Dose  . acetaminophen (TYLENOL) tablet 650 mg  650 mg Oral Q6H PRN Norval Morton, MD       Or  . acetaminophen (TYLENOL) suppository 650 mg  650 mg Rectal Q6H PRN Smith, Rondell A, MD      . albuterol (PROVENTIL) (2.5 MG/3ML) 0.083% nebulizer solution 2.5 mg  2.5 mg Nebulization Q6H PRN Fuller Plan A, MD      . ALPRAZolam Duanne Moron) tablet 0.25 mg  0.25 mg Oral BID PRN Fuller Plan A, MD   0.25 mg at 04/11/19 0904  . atorvastatin (LIPITOR) tablet 80 mg  80 mg Oral QPM Smith, Rondell A, MD   80 mg at 04/10/19 1805  . cefTAZidime (FORTAZ) 1 g in sodium chloride 0.9 % 100 mL  IVPB  1 g Intravenous Q24H Smith, Rondell A, MD      . cinacalcet (SENSIPAR) tablet 30 mg  30 mg Oral Q T,Th,Sat-1800 Smith, Rondell A, MD      . doxercalciferol (HECTOROL) injection 1 mcg  1 mcg Intravenous Q T,Th,Sa-HD Smith, Rondell A, MD      . guaiFENesin (MUCINEX) 12 hr tablet 600 mg  600 mg Oral BID PRN Smith, Rondell A, MD      . heparin ADULT infusion 100 units/mL (25000 units/220mL sodium chloride 0.45%)  1,400 Units/hr Intravenous Continuous Britt Boozer, RPH      . HYDROcodone-acetaminophen (NORCO/VICODIN) 5-325 MG per tablet 1 tablet  1 tablet Oral Q6H PRN Fuller Plan A, MD   1 tablet at 04/11/19 0824  . hydrOXYzine (ATARAX/VISTARIL) tablet 12.5-25 mg  12.5-25 mg Oral BID PRN Norval Morton, MD   25 mg at 04/11/19 0540  . insulin aspart (novoLOG) injection 0-15 Units  0-15 Units Subcutaneous TID WC Norval Morton, MD   3 Units at 04/11/19 0735  . insulin aspart (novoLOG) injection 0-5 Units  0-5 Units Subcutaneous QHS Norval Morton, MD   2 Units at 04/10/19 2253  . insulin detemir (LEVEMIR) injection 18 Units  18 Units Subcutaneous Daily [place] Eric J, DO      . lidocaine-prilocaine (EMLA) cream 1 application  1 application Topical See admin instructions Tamala Julian, Rondell A, MD      . loratadine (CLARITIN) tablet 10 mg  10 mg Oral Daily PRN Fuller Plan A, MD      . Melatonin TABS 6-9 mg  6-9 mg Oral QHS PRN Fuller Plan A, MD      . metoprolol tartrate (LOPRESSOR) tablet 12.5 mg  12.5 mg Oral 2 times per day on Sun Mon Wed Fri Fuller Plan A, MD   12.5 mg at 04/10/19 2157  . midodrine (PROAMATINE) tablet 10 mg  10 mg Oral Q T,Th,S,Su Smith, Rondell A, MD      . midodrine (PROAMATINE) tablet 2.5 mg  2.5 mg Oral TID WC Smith, Rondell A, MD   2.5 mg  at 04/11/19 0826  . mometasone-formoterol (DULERA) 200-5 MCG/ACT inhaler 2 puff  2 puff Inhalation BID Fuller Plan A, MD   2 puff at 04/11/19 0850  . ondansetron (ZOFRAN) tablet 4 mg  4 mg Oral Q6H PRN Fuller Plan A,  MD       Or  . ondansetron (ZOFRAN) injection 4 mg  4 mg Intravenous Q6H PRN Smith, Rondell A, MD      . pantoprazole (PROTONIX) EC tablet 40 mg  40 mg Oral QPM Smith, Rondell A, MD   40 mg at 04/10/19 1804  . sevelamer carbonate (RENVELA) tablet 2,400 mg  2,400 mg Oral TID WC Smith, Rondell A, MD   2,400 mg at 04/11/19 0827  . sodium chloride flush (NS) 0.9 % injection 3 mL  3 mL Intravenous Q12H Smith, Rondell A, MD   3 mL at 04/10/19 2152  . vancomycin (VANCOCIN) IVPB 1000 mg/200 mL premix  1,000 mg Intravenous Q T,Th,Sa-HD Smith, Rondell A, MD         ROS: As per HPI otherwise negative.  Physical Exam: Vitals:   04/10/19 2108 04/11/19 0445 04/11/19 0850 04/11/19 0854  BP: 107/63 104/62  113/60  Pulse: (!) 56 (!) 56  (!) 58  Resp: 18 18  18   Temp: 98.2 F (36.8 C) 98.2 F (36.8 C)  98.6 F (37 C)  TempSrc: Oral Oral  Oral  SpO2: 99% 100% 100% 100%  Weight:      Height:         General: WDWN elderly male NAD  Head: NCAT sclera not icteric MMM Neck: Supple. No JVD  Lungs: CTA bilaterally without wheezes, rales, or rhonchi. Breathing is unlabored. Heart: RRR soft systolic murmur  Abdomen: soft NT + BS Lower extremities: Ischemic changes/dry gangrene to 1-5th toes left foot.  Neuro: A & O  X 3. Moves all extremities spontaneously. Psych:  Responds to questions appropriately with a normal affect. Dialysis Access: LUE AVF +bruit   Labs: Basic Metabolic Panel: Recent Labs  Lab 04/10/19 1305 04/11/19 0514  NA 132* 132*  K 5.0 5.1  CL 91* 89*  CO2 24 26  GLUCOSE 222* 191*  BUN 26* 33*  CREATININE 3.72* 4.47*  CALCIUM 9.0 9.2   Liver Function Tests: Recent Labs  Lab 04/10/19 1305  AST 26  ALT 18  ALKPHOS 134*  BILITOT 1.8*  PROT 6.5  ALBUMIN 2.9*   No results for input(s): LIPASE, AMYLASE in the last 168 hours. No results for input(s): AMMONIA in the last 168 hours. CBC: Recent Labs  Lab 04/10/19 1400 04/11/19 0514  WBC 10.7* 10.4  NEUTROABS 7.9*  --    HGB 11.3* 11.2*  HCT 36.2* 35.9*  MCV 91.6 91.1  PLT 215 200   Cardiac Enzymes: Recent Labs  Lab 04/10/19 1305 04/11/19 0514  TROPONINI 0.63* 0.61*   CBG: Recent Labs  Lab 04/10/19 1707 04/10/19 2202 04/11/19 0643 04/11/19 1133  GLUCAP 178* 209* 185* 155*   Iron Studies: No results for input(s): IRON, TIBC, TRANSFERRIN, FERRITIN in the last 72 hours. Studies/Results: Dg Chest 2 View  Result Date: 04/10/2019 CLINICAL DATA:  Black toes, pain, shortness of breath EXAM: CHEST - 2 VIEW COMPARISON:  02/20/2019 FINDINGS: Cardiomegaly status post aortic valve stent endograft repair. Right chest multi lead pacer. There is minimal, diffuse interstitial pulmonary opacity bilaterally without focal airspace abnormality. The visualized skeletal structures are unremarkable. IMPRESSION: Cardiomegaly with minimal, diffuse interstitial pulmonary opacity, which may reflect minimal edema or chronic interstitial change.  There is no focal airspace opacity. Electronically Signed   By: Eddie Candle M.D.   On: 04/10/2019 14:17   Dg Foot Complete Left  Result Date: 04/10/2019 CLINICAL DATA:  Initial evaluation for acute pain, osteomyelitis. EXAM: LEFT FOOT - COMPLETE 3+ VIEW COMPARISON:  None. FINDINGS: No acute fracture or dislocation. Scattered osteoarthritic changes present throughout the midfoot. Subtle progressive osseous erosion at the right fourth distal phalanx as compared to previous exam, suspicious for possible osteomyelitis. Soft tissue ulceration overlies the great toe. Subtle erosive changes at the underlying right first metatarsal head also suspicious for possible osteomyelitis. No other definite osseous erosions. Calcified atherosclerosis. IMPRESSION: 1. Subtle progressive osseous erosion at the right fourth distal phalanx as compared to previous exam, suspicious for osteomyelitis. 2. Soft tissue ulceration overlying the right great toe. Erosive changes involving the underlying right first  metatarsal head also suspicious for osteomyelitis. 3. Extensive atherosclerosis. Electronically Signed   By: Jeannine Boga M.D.   On: 04/10/2019 14:20    Dialysis Orders:  [facility] MTThS  4 hours Optiflux 200 400/800  EDW 97.5kg UFP 4 LUE AVF No heparin  Hectorol 1 mcg TIW   Assessment/Plan: 1. 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  Empiric antibiotics/blood cultures per primary.  For amputation per VVS.  2. ESRD -  HD 4x/week for volume. MTTS. For HD today on schedule.  3. HTN/volume  - Chronic hypotension. On midodrine for BP support. UF to EDW as tolerated.  4. Anemia  - Hgb 11.2 Not on ESA as outpatient  5. Metabolic bone disease -  Continue Sensipar/Renvela binder.  6. AFib. S/p PM. On Eliquis  metoprolol. Holding Eliquis for surgery  7. DM Type 2. Hgb A1c 8.4%. Insulin per primary   Lynnda Child PA-C [place] Pager [phone] 04/11/2019, 11:37 AM

## 2019-04-11 NOTE — Progress Notes (Addendum)
ANTICOAGULATION CONSULT NOTE - Initial Consult  Pharmacy Consult for heparin Indication: atrial fibrillation  Allergies  Allergen Reactions  . Byetta 10 Mcg Pen [Exenatide] Diarrhea and Nausea And Vomiting  . Codeine Itching  . Coumadin [Warfarin Sodium] Rash    (wife states coumadin was stopped but rash did not disappear 02/21/19)    Patient Measurements: Height: 5\' 11"  (180.3 cm) Weight: 223 lb 1.7 oz (101.2 kg) IBW/kg (Calculated) : 75.3 Heparin Dosing Weight: 96.2 kg  Vital Signs: Temp: 98.6 F (37 C) (05/16 0854) Temp Source: Oral (05/16 0854) BP: 113/60 (05/16 0854) Pulse Rate: 58 (05/16 0854)  Labs: Recent Labs    04/10/19 1305 04/10/19 1400 04/11/19 0514  HGB  --  11.3* 11.2*  HCT  --  36.2* 35.9*  PLT  --  215 200  CREATININE 3.72*  --  4.47*  TROPONINI 0.63*  --  0.61*    Estimated Creatinine Clearance: 16 mL/min (A) (by C-G formula based on SCr of 4.47 mg/dL (H)).   Medical History: Past Medical History:  Diagnosis Date  . Anginal pain ([place])   . Aortic stenosis    a. severe by echo 09/2014  . Atrial fibrillation ([place])    a. not well documented, not on anticoagulation  . CHF (congestive heart failure) ([place])    04/28/17 echo-EF 40%, mod LVH, diastolic dysfunction  . Claustrophobia   . Complete heart block ([place])   . COPD (chronic obstructive pulmonary disease) ([place])   . Coronary artery disease    a. chronically occluded RCA per cath 09/2014 with collaterals B. cath 05/01/17 chr occ RCA w/collaterals, 60-70% mid LAD,   . CVA (cerebral vascular accident) ([place]) 10/2014   denies residual on 07/11/2015  . ESRD (end stage renal disease) on dialysis [facility])    a. on dialysis; [facility]; MWF, LUE fistula (07/11/2015)  . History of blood transfusion    "related to gallbladder OR"  . History of stomach ulcers   . Hyperlipidemia   . Hypertension   . Iron deficiency anemia   . Myocardial infarction ([place]) 10/2014  . Peripheral vascular disease ([place])   .  Pneumonia   . Presence of permanent cardiac pacemaker   . 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 Type II diabetes mellitus ([place])     Assessment: 62 yoM admitted 5/15 for left foot wound. Patient on apixaban 2.5 mg PO BID (last dose 5/15 AM) PTA for afib. Apixaban on hold now for surgery, given patient's CHADsVASc of 6, will initiate heparin gtt. Baseline Hgb 11.2, HCT 35.9 and pltc 200. Patient's Scr 4.47, but PMH significant for ESRD on HD TTS.  Goal of Therapy:  Heparin level 0.3-0.7 units/ml aPTT 66-102 seconds Monitor platelets by anticoagulation protocol: Yes   Plan:  Start heparin infusion at 1400 units/hr (no bolus) Obtain baseline heparin level and aPTT Check  aPTT level in 8 hours  Check heparin level and CBC daily while on heparin Continue to monitor H&H and platelets  Thank you for allowing pharmacy to be a part of this patient's care.  Leron Croak, PharmD PGY1 Pharmacy Resident Phone: [phone]  Please check AMION for all [place] phone numbers 04/11/2019,11:15 AM

## 2019-04-12 LAB — CBC
HCT: 32.9 % — ABNORMAL LOW (ref 39.0–52.0)
Hemoglobin: 10.5 g/dL — ABNORMAL LOW (ref 13.0–17.0)
MCH: 28.9 pg (ref 26.0–34.0)
MCHC: 31.9 g/dL (ref 30.0–36.0)
MCV: 90.6 fL (ref 80.0–100.0)
Platelets: 195 10*3/uL (ref 150–400)
RBC: 3.63 MIL/uL — ABNORMAL LOW (ref 4.22–5.81)
RDW: 16.2 % — ABNORMAL HIGH (ref 11.5–15.5)
WBC: 10.9 10*3/uL — ABNORMAL HIGH (ref 4.0–10.5)
nRBC: 0 % (ref 0.0–0.2)

## 2019-04-12 LAB — GLUCOSE, CAPILLARY
Glucose-Capillary: 115 mg/dL — ABNORMAL HIGH (ref 70–99)
Glucose-Capillary: 145 mg/dL — ABNORMAL HIGH (ref 70–99)
Glucose-Capillary: 164 mg/dL — ABNORMAL HIGH (ref 70–99)
Glucose-Capillary: 173 mg/dL — ABNORMAL HIGH (ref 70–99)

## 2019-04-12 LAB — HEPARIN LEVEL (UNFRACTIONATED): Heparin Unfractionated: 1.9 IU/mL — ABNORMAL HIGH (ref 0.30–0.70)

## 2019-04-12 LAB — BASIC METABOLIC PANEL
Anion gap: 9 (ref 5–15)
BUN: 14 mg/dL (ref 8–23)
CO2: 32 mmol/L (ref 22–32)
Calcium: 8.7 mg/dL — ABNORMAL LOW (ref 8.9–10.3)
Chloride: 93 mmol/L — ABNORMAL LOW (ref 98–111)
Creatinine, Ser: 2.69 mg/dL — ABNORMAL HIGH (ref 0.61–1.24)
GFR calc Af Amer: 25 mL/min — ABNORMAL LOW (ref >60)
GFR calc non Af Amer: 21 mL/min — ABNORMAL LOW (ref >60)
Glucose, Bld: 114 mg/dL — ABNORMAL HIGH (ref 70–99)
Potassium: 4.1 mmol/L (ref 3.5–5.1)
Sodium: 134 mmol/L — ABNORMAL LOW (ref 135–145)

## 2019-04-12 LAB — APTT
aPTT: >200 seconds (ref 24–36)
aPTT: 62 seconds — ABNORMAL HIGH (ref 24–36)
aPTT: 65 seconds — ABNORMAL HIGH (ref 24–36)
aPTT: 92 seconds — ABNORMAL HIGH (ref 24–36)

## 2019-04-12 MED ORDER — MORPHINE SULFATE (PF) 2 MG/ML IV SOLN
1.0000 mg | INTRAVENOUS | Status: DC | PRN
  Administered 2019-04-12 (×2): 1 mg via INTRAVENOUS
  Filled 2019-04-12 (×3): qty 1

## 2019-04-12 MED ORDER — CHLORHEXIDINE GLUCONATE CLOTH 2 % EX PADS
6.0000 | MEDICATED_PAD | Freq: Every day | CUTANEOUS | Status: DC
  Administered 2019-04-12 – 2019-04-14 (×2): 6 via TOPICAL

## 2019-04-12 MED ORDER — VANCOMYCIN HCL IN DEXTROSE 1-5 GM/200ML-% IV SOLN
1000.0000 mg | INTRAVENOUS | Status: AC
  Administered 2019-04-13: 1000 mg via INTRAVENOUS
  Filled 2019-04-12: qty 200

## 2019-04-12 MED ORDER — HYDROCODONE-ACETAMINOPHEN 5-325 MG PO TABS
2.0000 | ORAL_TABLET | Freq: Four times a day (QID) | ORAL | Status: DC | PRN
  Administered 2019-04-12 – 2019-04-13 (×2): 2 via ORAL
  Filled 2019-04-12 (×2): qty 2

## 2019-04-12 NOTE — TOC Initial Note (Signed)
Transition of Care [facility]) - Initial/Assessment Note    Patient Details  Name: Jeffrey [name] MRN: [id number] Date of Birth: 07/02/1941  Transition of [facility]) CM/SW Contact:    Carles Collet, RN Phone Number: 04/12/2019, 12:14 PM  Clinical Narrative:                Damaris Schooner w patient who deferred call to his wife. SPoke w patient's wife. Patient hhas O2 through [place], [place] and RW at home. He would need 3/1 with drop arm, and hoyer lift if he were to go home. He was heavy assist PTA. We discussed dispo plan and decided to further consider all options after PT evals post op.  Plan for BKA Tuesday, will have PT eval after ward.    Expected Discharge Plan: [place] Barriers to Discharge: Continued Medical Work up   Patient Goals and CMS Choice        Expected Discharge Plan and Services Expected Discharge Plan: [place]                                              Prior Living Arrangements/Services   Lives with:: Spouse              Current home services: DME    Activities of Daily Living Home Assistive Devices/Equipment: [employer] fast streamer, Oxygen, CBG Meter ADL Screening (condition at time of admission) Patient's cognitive ability adequate to safely complete daily activities?: Yes Is the patient deaf or have difficulty hearing?: No Does the patient have difficulty seeing, even when wearing glasses/contacts?: No Does the patient have difficulty concentrating, remembering, or making decisions?: No Patient able to express need for assistance with ADLs?: Yes Does the patient have difficulty dressing or bathing?: Yes Independently performs ADLs?: No Communication: Independent Dressing (OT): Needs assistance Is this a change from baseline?: Pre-admission baseline Grooming: Needs assistance Is this a change from baseline?: Pre-admission baseline Feeding: Independent Bathing: Needs assistance Is this a change from baseline?:  Pre-admission baseline Toileting: Needs assistance Is this a change from baseline?: Pre-admission baseline In/Out Bed: Needs assistance Is this a change from baseline?: Pre-admission baseline Walks in Home: Needs assistance Is this a change from baseline?: Pre-admission baseline Does the patient have difficulty walking or climbing stairs?: Yes Weakness of Legs: Both Weakness of Arms/Hands: None  Permission Sought/Granted                  Emotional Assessment              Admission diagnosis:  PVD (peripheral vascular disease) ([place]) [I73.9] ESRD on hemodialysis ([place]) [N18.6, Z99.2] Gangrene of left foot ([place]) [I96] Other acute osteomyelitis of left foot ([place]) [O67.124] Patient Active Problem List   Diagnosis Date Noted  . Osteomyelitis ([place]) 04/10/2019  . Acute osteomyelitis of left foot ([place]) 04/10/2019  . Cellulitis in diabetic foot ([place]) 02/24/2019  . Chronic anticoagulation 02/24/2019  . Venous stasis 02/24/2019  . Hypotension 02/24/2019  . Discoloration of skin of foot 02/20/2019  . Discoloration of skin of multiple sites of lower extremity 02/20/2019  . RSV (respiratory syncytial virus infection) 11/30/2018  . Troponin level elevated   . COPD exacerbation ([place]) 11/29/2018  . Chronic respiratory failure with hypoxia ([place]) 10/29/2018  . Respiratory distress 07/27/2018  . GERD (gastroesophageal reflux disease) 06/23/2018  . Cough 06/23/2018  .  Chest pain 04/15/2018  . NSTEMI (non-ST elevated myocardial infarction) ([place]) 04/14/2018  . Rash 04/14/2018  . COPD GOLD 0 09/26/2017  . Atrial fibrillation ([place]) [I48.91] 05/21/2017  . Encounter for therapeutic drug monitoring 05/21/2017  . Obesity (BMI 30-39.9) 04/17/2017  . Hyponatremia 03/10/2017  . COPD with acute exacerbation ([place]) 03/10/2017  . Non-healing open wound of left groin 09/05/2015  . Automatic implantable cardioverter-defibrillator in situ 08/08/2015  . S/P TAVR (transcatheter aortic valve  replacement) 08/02/2015  . Hypervolemia   . Chronic combined systolic and diastolic CHF (congestive heart failure) ([place]) 07/19/2015  . Fluid overload 07/18/2015  . Acute on chronic respiratory failure with hypoxia ([place]) 07/18/2015  . 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/2015  . Atherosclerosis of native coronary artery of native heart with angina pectoris ([place])   . Protein-calorie malnutrition, severe ([place]) 01/14/2015  . ESRD (end stage renal disease) on dialysis ([place]) 01/12/2015  . Mobitz type 1 second degree AV block   . Elevated troponin 09/30/2014  . Diabetes type 2, controlled ([place])   . History of stroke 09/29/2014  . Type 2 diabetes mellitus with hypoglycemia without coma ([place]) 09/29/2014  . CAD (coronary artery disease) 09/29/2014  . Essential hypertension   . History of tobacco use 11/21/2012  . Hyperlipidemia 01/19/2010  . Arthritis, degenerative 01/19/2010  . Allergic rhinitis 12/05/2009  . Idiopathic peripheral neuropathy 01/05/2009  . 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  . Anemia of chronic disease 11/13/2007   PCP:  Scot Jun, FNP Pharmacy:   [id number] - [place], [address] [place] [address] Phone: [phone] Fax: [phone]  [facility] - [place], [place] - [address] [place], [address] [place] [address] Phone: [phone] Fax: [phone]     Social Determinants of Health (SDOH) Interventions    Readmission Risk Interventions No flowsheet data found.

## 2019-04-12 NOTE — Progress Notes (Signed)
Subjective:  HD yest- removed 2500- tolerated well - customer service issues- breakfast and coffee was late, upset about leg  Objective Vital signs in last 24 hours: Vitals:   04/11/19 2116 04/11/19 2142 04/12/19 0415 04/12/19 0818  BP: (!) 110/48 100/83 (!) 91/47   Pulse: (!) 56 (!) 106 67   Resp: 14 20 15    Temp: 98.1 F (36.7 C) 97.8 F (36.6 C) 98.5 F (36.9 C)   TempSrc: Oral Oral    SpO2: 100% 100% 91% 96%  Weight: 100.4 kg 100.4 kg    Height:       Weight change: 5.1 kg  Intake/Output Summary (Last 24 hours) at 04/12/2019 0831 Last data filed at 04/12/2019 0600 Gross per 24 hour  Intake 909.47 ml  Output 2511 ml  Net -1601.53 ml    Dialysis Orders:  [place] [facility]  4 hours Optiflux 200 400/800  EDW 97.5kg UFP 4 LUE AVF No heparin  Hectorol 1 mcg TIW   Assessment/ Plan: Pt is a 78 y.o. yo male with ESRD who was admitted on 04/10/2019 with left foot gangrene    1. 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  Empiric antibiotics- fortaz and vanc/blood cultures per primary.  For amputation per VVS, not sure when.  2. ESRD -  HD 4x/week for volume. MTTS. For HD Monday on schedule via AVF.  3. HTN/volume  - Chronic hypotension. On midodrine for BP support. UF to EDW as tolerated. Still over 4. Anemia  - Hgb 11.2 Not on ESA as outpatient - 10.5 today , will watch  5. Metabolic bone disease -  Continue  Hectorol and Sensipar/Renvela binder.  6. AFib. S/p PM. On Eliquis  metoprolol. Holding Eliquis for surgery  7. DM Type 2. Hgb A1c 8.4%. Insulin per primary      [place]: Basic Metabolic Panel: Recent Labs  Lab 04/10/19 1305 04/11/19 0514 04/11/19 2358  NA 132* 132* 134*  K 5.0 5.1 4.1  CL 91* 89* 93*  CO2 24 26 32  GLUCOSE 222* 191* 114*  BUN 26* 33* 14  CREATININE 3.72* 4.47* 2.69*  CALCIUM 9.0 9.2 8.7*   Liver Function Tests: Recent Labs  Lab 04/10/19 1305  AST 26  ALT 18  ALKPHOS 134*  BILITOT 1.8*  PROT 6.5   ALBUMIN 2.9*   No results for input(s): LIPASE, AMYLASE in the last 168 hours. No results for input(s): AMMONIA in the last 168 hours. CBC: Recent Labs  Lab 04/10/19 1400 04/11/19 0514 04/11/19 2358  WBC 10.7* 10.4 10.9*  NEUTROABS 7.9*  --   --   HGB 11.3* 11.2* 10.5*  HCT 36.2* 35.9* 32.9*  MCV 91.6 91.1 90.6  PLT 215 200 195   Cardiac Enzymes: Recent Labs  Lab 04/10/19 1305 04/11/19 0514  TROPONINI 0.63* 0.61*   CBG: Recent Labs  Lab 04/10/19 2202 04/11/19 0643 04/11/19 1133 04/11/19 2142 04/12/19 0719  GLUCAP 209* 185* 155* 75 115*    Iron Studies: No results for input(s): IRON, TIBC, TRANSFERRIN, FERRITIN in the last 72 hours. Studies/Results: Dg Chest 2 View  Result Date: 04/10/2019 CLINICAL DATA:  Black toes, pain, shortness of breath EXAM: CHEST - 2 VIEW COMPARISON:  02/20/2019 FINDINGS: Cardiomegaly status post aortic valve stent endograft repair. Right chest multi lead pacer. There is minimal, diffuse interstitial pulmonary opacity bilaterally without focal airspace abnormality. The visualized skeletal structures are unremarkable. IMPRESSION: Cardiomegaly with minimal, diffuse interstitial pulmonary opacity, which may reflect minimal edema or chronic  interstitial change. There is no focal airspace opacity. Electronically Signed   By: Eddie Candle M.D.   On: 04/10/2019 14:17   Dg Foot Complete Left  Result Date: 04/10/2019 CLINICAL DATA:  Initial evaluation for acute pain, osteomyelitis. EXAM: LEFT FOOT - COMPLETE 3+ VIEW COMPARISON:  None. FINDINGS: No acute fracture or dislocation. Scattered osteoarthritic changes present throughout the midfoot. Subtle progressive osseous erosion at the right fourth distal phalanx as compared to previous exam, suspicious for possible osteomyelitis. Soft tissue ulceration overlies the great toe. Subtle erosive changes at the underlying right first metatarsal head also suspicious for possible osteomyelitis. No other definite  osseous erosions. Calcified atherosclerosis. IMPRESSION: 1. Subtle progressive osseous erosion at the right fourth distal phalanx as compared to previous exam, suspicious for osteomyelitis. 2. Soft tissue ulceration overlying the right great toe. Erosive changes involving the underlying right first metatarsal head also suspicious for osteomyelitis. 3. Extensive atherosclerosis. Electronically Signed   By: Jeannine Boga M.D.   On: 04/10/2019 14:20   Medications: Infusions: . cefTAZidime (FORTAZ)  IV 1 g (04/11/19 2205)  . heparin 1,500 Units/hr (04/12/19 0350)  . vancomycin Stopped (04/11/19 2104)    Scheduled Medications: . atorvastatin  80 mg Oral QPM  . Chlorhexidine Gluconate Cloth  6 each Topical Q0600  . cinacalcet  30 mg Oral Q T,Th,Sat-1800  . doxercalciferol  1 mcg Intravenous Q T,Th,Sa-HD  . insulin aspart  0-15 Units Subcutaneous TID WC  . insulin aspart  0-5 Units Subcutaneous QHS  . insulin detemir  18 Units Subcutaneous Daily  . lidocaine-prilocaine  1 application Topical See admin instructions  . metoprolol tartrate  12.5 mg Oral 2 times per day on Sun Mon Wed Fri  . midodrine  10 mg Oral Q T,Th,S,Su  . midodrine  2.5 mg Oral TID WC  . mometasone-formoterol  2 puff Inhalation BID  . pantoprazole  40 mg Oral QPM  . sevelamer carbonate  2,400 mg Oral TID WC  . sodium chloride flush  3 mL Intravenous Q12H    have reviewed scheduled and prn medications.  Physical Exam: General: sitting up at bedside, multiple complaints  Heart: RRR Lungs: mostly clear Abdomen: soft, non tender Extremities: min edema- left foot wrapped  Dialysis Access: left AVF     04/12/2019,8:31 AM  LOS: 2 days

## 2019-04-12 NOTE — Consult Note (Signed)
Jeffrey [name] Nurse wound consult note Reason for Consult: Left foot wound that has gotten progressively worse over last week.  Patient was seen on Friday evening by Dr. Carlis [name] (Vascular) and by Dr. Oneida Alar (Vascular) this morning. Plan is for left BKA on Tuesday with Dr Jeffrey [name]. 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  Please re-consult if needed. Thanks, Jeffrey Flakes, MSN, RN, [name], Jeffrey [name]  Pager# [phone]

## 2019-04-12 NOTE — Progress Notes (Signed)
PROGRESS NOTE    Jeffrey [name]  [name] DOB: [birth year] DOA: 04/10/2019 PCP: Jeffrey Jun, FNP    Brief Narrative:   Jeffrey [name] is a 78 y.o. male with medical history significant of CHF, s/p pacemaker placement, s/p TAVR, A. fib on apixaban, CAD s/p stenting, ESRD on HD, DM type 2, PVD, and anemia; who presents with complaints of worsening left foot pain.  Admitted in March for cellulitis of the same foot, but reports that has not been getting better.  At this time he reports that he is wheelchair-bound and his son and wife help him with transfers at home.  He had an abdominal aortogram along with the lower extremities performed by Dr. Scot Dock on 5/1.  Based off of that study he was thought to have reasonable flow to the left foot with real good options for revascularization.  At that time he felt that his toes were similar to now with dark appearance of the second, third, and fourth toe.  He was advised to wash foot daily and keep it clean which he has done.  However, over the last 14 days he has developed progressively worsening pain that he describes as sharp and shooting.  He has been trying to take Tylenol without any relief of symptoms.  Denies having any significant fever, chills, abdominal pain, nausea, vomiting, or diarrhea.  He last had hemodialysis yesterday.  ED Course: On admission to the emergency department patient was noted to be afebrile, pulse 51-60, respirations 14-23, blood pressure 93/48-147/129, and O2 saturations maintained on 2 L nasal cannula oxygen.  Labs revealed WBC 10.7, hemoglobin 11.3, sodium 132, potassium 5, BUN 26, creatinine 3.72, glucose 222, lactic acid 2.1 ->2.2, and troponin  0.63.  X-rays revealed erosive osseous changes of the fourth digit left foot concerning for osteomyelitis.  Blood cultures were obtained.  Patient was given empiric antibiotics of vancomycin and Fortaz.   Assessment & Plan:   Active Problems:   Diabetes type  2, controlled ([place])   ESRD (end stage renal disease) on dialysis (HCC)   Anemia of chronic disease   Automatic implantable cardioverter-defibrillator in situ   Chronic respiratory failure with hypoxia (HCC)   Troponin level elevated   Osteomyelitis (HCC)   Sepsis, present on admission Left foot osteomyelitis with gangrene Patient presenting with progressive left foot pain with associated skin changes.  Noted to have elevated WBC count of 10.7, respirations 23, lactic acid 2.1 on admission.  CRP 10.1. Afebrile.  X-ray left foot notable for progressive osseous erosions concerning for osteomyelitis at the fourth distal phalanx. --Vascular surgery consulted, and following appreciate assistance --Empiric antibiotics with vancomycin and Tressie Ellis, pharmacy for assistance in dosing/monitoring --Pain control with Norco and IV morphine as needed --Vascular surgery planning BKA on Tuesday 5/19 --PT/OT evaluation following surgical intervention for discharge needs --Continue supportive care  Permanent atrial fibrillation History of PPM placement.  On Eliquis and metoprolol outpatient. --Hold Eliquis in anticipation of surgical intervention, started heparin drip in the perioperative setting given elevated CHADS-VASc score of 6 --Continue rate control with metoprolol --Continue to monitor on telemetry  ESRD on HD Dialyzes in [place] on a Tuesday, Thursday, Saturday schedule. --Nephrology consulted for assistance with continued hemodialysis while inpatient  Type 2 diabetes mellitus Patient's home regimen includes Levemir 28 units nightly.  Patient states insulin dose recently reduced by PCP.  Previous hemoglobin A1c 6.3 on 01/07/2019. --Hemoglobin A1c up to 8.4, poorly controlled --Levemir at reduced  dose of 18 units daily --CBGs TIDAC --moderate SSI --Hypoglycemic protocol  Peripheral vascular disease --Plavix currently held due to possible need of surgical procedure  Hypotension: Chronic.   Patient notes systolic blood pressures normally will be in the 80s.  Maps currently greater than 65. --Continue midodrine  Anemia of chronic disease:  Hemoglobin 11.3 g/dL.  Patient does not report any acute bleeding. --Follow CBC  COPD, without exacerbation chronic respiratory failure with hypoxia --Continue home 2 L of nasal cannula oxygen --Pharmacy substitution of Dulera for Symbicort --Albuterol nebs as needed for shortness of breath/wheezing  Aortic stenosis s/p TAVR: Stable  Anxiety --Continue Xanax as needed anxiety   DVT prophylaxis: SCDs, heparin drip Code Status: Full code Family Communication: None Disposition Plan: Continue inpatient hospitalization, BKA on Tuesday per vascular surgery.   Consultants:   Vascular surgery  Procedures:   None  Antimicrobials:   Vancomycin 5/15>>  Tressie Ellis 5/15>>   Subjective: Patient seen and examined at bedside, complains of pain to the left foot.  Seen by vascular surgery this morning with plans of BKA on Tuesday.  Requests adjustment of his pain regimen.  No other complaints at this time.  Denies headache, no fever/chills/night sweats, no chest pain, no palpitations, no abdominal pain, no issues with bowel/bladder function.  No acute events overnight per nursing staff.  Objective: Vitals:   04/11/19 2142 04/12/19 0415 04/12/19 0818 04/12/19 1000  BP: 100/83 (!) 91/47  (!) 101/50  Pulse: (!) 106 67  72  Resp: 20 15  16   Temp: 97.8 F (36.6 C) 98.5 F (36.9 C)  98.4 F (36.9 C)  TempSrc: Oral   Oral  SpO2: 100% 91% 96% 98%  Weight: 100.4 kg     Height:        Intake/Output Summary (Last 24 hours) at 04/12/2019 1348 Last data filed at 04/12/2019 0900 Gross per 24 hour  Intake 909.47 ml  Output 2511 ml  Net -1601.53 ml   Filed Weights   04/11/19 1640 04/11/19 2116 04/11/19 2142  Weight: 103.1 kg 100.4 kg 100.4 kg    Examination:  General exam: Appears calm and comfortable  Respiratory system: Clear to  auscultation. Respiratory effort normal.  On 2 L nasal cannula which is his home dose Cardiovascular system: S1 & S2 heard, RRR. No JVD, murmurs, rubs, gallops or clicks. No pedal edema. Gastrointestinal system: Abdomen is nondistended, soft and nontender. No organomegaly or masses felt. Normal bowel sounds heard. Central nervous system: Alert and oriented. No focal neurological deficits. Extremities: Symmetric 5 x 5 power. Skin: Necrotic black appearance of dorsal aspect of 2nd, 3rd, 4th,and part of 5th toe with surrounding erythema Psychiatry: Judgement and insight appear normal. Mood & affect appropriate.     Data Reviewed: I have personally reviewed following labs and imaging studies  CBC: Recent Labs  Lab 04/10/19 1400 04/11/19 0514 04/11/19 2358  WBC 10.7* 10.4 10.9*  NEUTROABS 7.9*  --   --   HGB 11.3* 11.2* 10.5*  HCT 36.2* 35.9* 32.9*  MCV 91.6 91.1 90.6  PLT 215 200 329   Basic Metabolic Panel: Recent Labs  Lab 04/10/19 1305 04/11/19 0514 04/11/19 2358  NA 132* 132* 134*  K 5.0 5.1 4.1  CL 91* 89* 93*  CO2 24 26 32  GLUCOSE 222* 191* 114*  BUN 26* 33* 14  CREATININE 3.72* 4.47* 2.69*  CALCIUM 9.0 9.2 8.7*   GFR: Estimated Creatinine Clearance: 26.4 mL/min (A) (by C-G formula based on SCr of 2.69 mg/dL (H)).  Liver Function Tests: Recent Labs  Lab 04/10/19 1305  AST 26  ALT 18  ALKPHOS 134*  BILITOT 1.8*  PROT 6.5  ALBUMIN 2.9*   No results for input(s): LIPASE, AMYLASE in the last 168 hours. No results for input(s): AMMONIA in the last 168 hours. Coagulation Profile: No results for input(s): INR, PROTIME in the last 168 hours. Cardiac Enzymes: Recent Labs  Lab 04/10/19 1305 04/11/19 0514  TROPONINI 0.63* 0.61*   BNP (last 3 results) No results for input(s): PROBNP in the last 8760 hours. HbA1C: Recent Labs    04/11/19 0514  HGBA1C 8.4*   CBG: Recent Labs  Lab 04/11/19 0643 04/11/19 1133 04/11/19 2142 04/12/19 0719 04/12/19 1129   GLUCAP 185* 155* 75 115* 145*   Lipid Profile: No results for input(s): CHOL, HDL, LDLCALC, TRIG, CHOLHDL, LDLDIRECT in the last 72 hours. Thyroid Function Tests: No results for input(s): TSH, T4TOTAL, FREET4, T3FREE, THYROIDAB in the last 72 hours. Anemia Panel: No results for input(s): VITAMINB12, FOLATE, FERRITIN, TIBC, IRON, RETICCTPCT in the last 72 hours. Sepsis Labs: Recent Labs  Lab 04/10/19 1430 04/10/19 1534  LATICACIDVEN 2.1* 2.2*    Recent Results (from the past 240 hour(s))  SARS Coronavirus 2 (CEPHEID - Performed in [facility] lab), Hosp Order     Status: None   Collection Time: 04/10/19 12:56 PM  Result Value Ref Range Status   SARS Coronavirus 2 NEGATIVE NEGATIVE Final    Comment: (NOTE) If result is NEGATIVE SARS-CoV-2 target nucleic acids are NOT DETECTED. The SARS-CoV-2 RNA is generally detectable in upper and lower  respiratory specimens during the acute phase of infection. The lowest  concentration of SARS-CoV-2 viral copies this assay can detect is 250  copies / mL. A negative result does not preclude SARS-CoV-2 infection  and should not be used as the sole basis for treatment or other  patient management decisions.  A negative result may occur with  improper specimen collection / handling, submission of specimen other  than nasopharyngeal swab, presence of viral mutation(s) within the  areas targeted by this assay, and inadequate number of viral copies  (<250 copies / mL). A negative result must be combined with clinical  observations, patient history, and epidemiological information. If result is POSITIVE SARS-CoV-2 target nucleic acids are DETECTED. The SARS-CoV-2 RNA is generally detectable in upper and lower  respiratory specimens dur ing the acute phase of infection.  Positive  results are indicative of active infection with SARS-CoV-2.  Clinical  correlation with patient history and other diagnostic information is  necessary to  determine patient infection status.  Positive results do  not rule out bacterial infection or co-infection with other viruses. If result is PRESUMPTIVE POSTIVE SARS-CoV-2 nucleic acids MAY BE PRESENT.   A presumptive positive result was obtained on the submitted specimen  and confirmed on repeat testing.  While 2019 novel coronavirus  (SARS-CoV-2) nucleic acids may be present in the submitted sample  additional confirmatory testing may be necessary for epidemiological  and / or clinical management purposes  to differentiate between  SARS-CoV-2 and other Sarbecovirus currently known to infect humans.  If clinically indicated additional testing with an alternate test  methodology [phone]) is advised. The SARS-CoV-2 RNA is generally  detectable in upper and lower respiratory sp ecimens during the acute  phase of infection. The expected result is Negative. Fact Sheet for Patients:  StrictlyIdeas.no Fact Sheet for Healthcare Providers: BankingDealers.co.za This test is not yet approved or cleared by the [place]  States FDA and has been authorized for detection and/or diagnosis of SARS-CoV-2 by FDA under an Emergency Use Authorization (EUA).  This EUA will remain in effect (meaning this test can be used) for the duration of the COVID-19 declaration under Section 564(b)(1) of the Act, 21 U.S.C. section 360bbb-3(b)(1), unless the authorization is terminated or revoked sooner. Performed at [facility], [place] [address], [place], [place] [address]   Culture, blood (routine x 2)     Status: None (Preliminary result)   Collection Time: 04/10/19  2:04 PM  Result Value Ref Range Status   Specimen Description BLOOD RIGHT ANTECUBITAL  Final   Special Requests   Final    AEROBIC BOTTLE ONLY Blood Culture results may not be optimal due to an excessive volume of blood received in culture bottles   Culture   Final    NO GROWTH < 24 HOURS Performed at  [facility], [place] [address], [place], [place] [address]    Report Status PENDING  Incomplete  MRSA PCR Screening     Status: None   Collection Time: 04/10/19  5:17 PM  Result Value Ref Range Status   MRSA by PCR NEGATIVE NEGATIVE Final    Comment:        The GeneXpert MRSA Assay (FDA approved for NASAL specimens only), is one component of a comprehensive MRSA colonization surveillance program. It is not intended to diagnose MRSA infection nor to guide or monitor treatment for MRSA infections. Performed at [facility], [place] [address] [place], [place] [address]   Culture, blood (routine x 2)     Status: None (Preliminary result)   Collection Time: 04/10/19  5:54 PM  Result Value Ref Range Status   Specimen Description BLOOD RIGHT HAND  Final   Special Requests   Final    BOTTLES DRAWN AEROBIC AND ANAEROBIC Blood Culture adequate volume   Culture   Final    NO GROWTH < 24 HOURS Performed at [facility], [place] [address], [place],  [address]    Report Status PENDING  Incomplete         Radiology Studies: Dg Chest 2 View  Result Date: 04/10/2019 CLINICAL DATA:  Black toes, pain, shortness of breath EXAM: CHEST - 2 VIEW COMPARISON:  02/20/2019 FINDINGS: Cardiomegaly status post aortic valve stent endograft repair. Right chest multi lead pacer. There is minimal, diffuse interstitial pulmonary opacity bilaterally without focal airspace abnormality. The visualized skeletal structures are unremarkable. IMPRESSION: Cardiomegaly with minimal, diffuse interstitial pulmonary opacity, which may reflect minimal edema or chronic interstitial change. There is no focal airspace opacity. Electronically Signed   By: Eddie Candle M.D.   On: 04/10/2019 14:17   Dg Foot Complete Left  Result Date: 04/10/2019 CLINICAL DATA:  Initial evaluation for acute pain, osteomyelitis. EXAM: LEFT FOOT - COMPLETE 3+ VIEW COMPARISON:  None. FINDINGS: No acute fracture or dislocation.  Scattered osteoarthritic changes present throughout the midfoot. Subtle progressive osseous erosion at the right fourth distal phalanx as compared to previous exam, suspicious for possible osteomyelitis. Soft tissue ulceration overlies the great toe. Subtle erosive changes at the underlying right first metatarsal head also suspicious for possible osteomyelitis. No other definite osseous erosions. Calcified atherosclerosis. IMPRESSION: 1. Subtle progressive osseous erosion at the right fourth distal phalanx as compared to previous exam, suspicious for osteomyelitis. 2. Soft tissue ulceration overlying the right great toe. Erosive changes involving the underlying right first metatarsal head also suspicious for osteomyelitis. 3. Extensive  atherosclerosis. Electronically Signed   By: Jeannine Boga M.D.   On: 04/10/2019 14:20        Scheduled Meds: . atorvastatin  80 mg Oral QPM  . Chlorhexidine Gluconate Cloth  6 each Topical Q0600  . Chlorhexidine Gluconate Cloth  6 each Topical Q0600  . cinacalcet  30 mg Oral Q T,Th,Sat-1800  . doxercalciferol  1 mcg Intravenous Q T,Th,Sa-HD  . insulin aspart  0-15 Units Subcutaneous TID WC  . insulin aspart  0-5 Units Subcutaneous QHS  . insulin detemir  18 Units Subcutaneous Daily  . lidocaine-prilocaine  1 application Topical See admin instructions  . metoprolol tartrate  12.5 mg Oral 2 times per day on Sun Mon Wed Fri  . midodrine  10 mg Oral Q T,Th,S,Su  . midodrine  2.5 mg Oral TID WC  . mometasone-formoterol  2 puff Inhalation BID  . pantoprazole  40 mg Oral QPM  . sevelamer carbonate  2,400 mg Oral TID WC  . sodium chloride flush  3 mL Intravenous Q12H   Continuous Infusions: . cefTAZidime (FORTAZ)  IV 1 g (04/11/19 2205)  . heparin 1,600 Units/hr (04/12/19 1221)  . vancomycin Stopped (04/11/19 2104)  . [START ON 04/13/2019] vancomycin       LOS: 2 days    Time spent: 29 minutes    Alexandr Oehler J [place] DO [facility] Pager  [phone]  If 7PM-7AM, please contact night-coverage [email or web address] Password [email or web address] 04/12/2019, 1:48 PM

## 2019-04-12 NOTE — Progress Notes (Signed)
[facility] for heparin Indication: atrial fibrillation  Allergies  Allergen Reactions  . Byetta 10 Mcg Pen [Exenatide] Diarrhea and Nausea And Vomiting  . Codeine Itching  . Coumadin [Warfarin Sodium] Rash    (wife states coumadin was stopped but rash did not disappear 02/21/19)   Patient Measurements: Height: 5\' 11"  (180.3 cm) Weight: 221 lb 5.5 oz (100.4 kg) IBW/kg (Calculated) : 75.3 Heparin Dosing Weight: 96.2 kg  Vital Signs: Temp: 98.2 F (36.8 C) (05/17 1621) Temp Source: Oral (05/17 1621) BP: 98/55 (05/17 1621) Pulse Rate: 54 (05/17 1621)  Labs: Recent Labs    04/10/19 1305  04/10/19 1400 04/11/19 0514  04/11/19 1351 04/11/19 2358 04/12/19 0216 04/12/19 1125 04/12/19 2003  HGB  --    < > 11.3* 11.2*  --   --  10.5*  --   --   --   HCT  --   --  36.2* 35.9*  --   --  32.9*  --   --   --   PLT  --   --  215 200  --   --  195  --   --   --   APTT  --   --   --   --    < > 42* >200* 62* 65* 92*  HEPARINUNFRC  --   --   --   --   --  1.30* 1.90*  --   --   --   CREATININE 3.72*  --   --  4.47*  --   --  2.69*  --   --   --   TROPONINI 0.63*  --   --  0.61*  --   --   --   --   --   --    < > = values in this interval not displayed.   Estimated Creatinine Clearance: 26.4 mL/min (A) (by C-G formula based on SCr of 2.69 mg/dL (H)).  Assessment: 83 yoM admitted 5/15 for left foot wound. PMH afib on Eliquis PTA (last dose 5/15 AM), currently on hold for surgery. Pharmacy consulted to dose IV heparin.   aPTT now within goal range at 92 following rate increase this AM. Hgb 10.5, pltc WNL stable. No bleeding noted.  Goal of Therapy:  Heparin level 0.3-0.7 units/ml aPTT 66-102 seconds Monitor platelets by anticoagulation protocol: Yes   Plan: Continue heparin gtt at 1600 units/hr  Recheck aPTT with AM labs Daily aPTT/heparin level, CBC Monitor s/sx of bleeding  Thank you for allowing pharmacy to be a part of this patient's  care.  Mila Merry Gerarda Fraction, PharmD, [place] PGY2 Infectious Diseases Pharmacy Resident Phone: [phone] 04/12/2019,8:52 PM

## 2019-04-12 NOTE — Progress Notes (Addendum)
ANTICOAGULATION CONSULT NOTE - Initial Consult  Pharmacy Consult for heparin Indication: atrial fibrillation  Allergies  Allergen Reactions  . Byetta 10 Mcg Pen [Exenatide] Diarrhea and Nausea And Vomiting  . Codeine Itching  . Coumadin [Warfarin Sodium] Rash    (wife states coumadin was stopped but rash did not disappear 02/21/19)    Patient Measurements: Height: 5\' 11"  (180.3 cm) Weight: 221 lb 5.5 oz (100.4 kg) IBW/kg (Calculated) : 75.3 Heparin Dosing Weight: 96.2 kg  Vital Signs: Temp: 98.4 F (36.9 C) (05/17 1000) Temp Source: Oral (05/17 1000) BP: 101/50 (05/17 1000) Pulse Rate: 72 (05/17 1000)  Labs: Recent Labs    04/10/19 1305  04/10/19 1400 04/11/19 0514  04/11/19 1351 04/11/19 2358 04/12/19 0216 04/12/19 1125  HGB  --    < > 11.3* 11.2*  --   --  10.5*  --   --   HCT  --   --  36.2* 35.9*  --   --  32.9*  --   --   PLT  --   --  215 200  --   --  195  --   --   APTT  --   --   --   --    < > 42* >200* 62* 65*  HEPARINUNFRC  --   --   --   --   --  1.30* 1.90*  --   --   CREATININE 3.72*  --   --  4.47*  --   --  2.69*  --   --   TROPONINI 0.63*  --   --  0.61*  --   --   --   --   --    < > = values in this interval not displayed.    Estimated Creatinine Clearance: 26.4 mL/min (A) (by C-G formula based on SCr of 2.69 mg/dL (H)).   Medical History: Past Medical History:  Diagnosis Date  . Anginal pain ([place])   . Aortic stenosis    a. severe by echo 09/2014  . Atrial fibrillation ([place])    a. not well documented, not on anticoagulation  . CHF (congestive heart failure) ([place])    04/28/17 echo-EF 40%, mod LVH, diastolic dysfunction  . Claustrophobia   . Complete heart block ([place])   . COPD (chronic obstructive pulmonary disease) ([place])   . Coronary artery disease    a. chronically occluded RCA per cath 09/2014 with collaterals B. cath 05/01/17 chr occ RCA w/collaterals, 60-70% mid LAD,   . CVA (cerebral vascular accident) ([place]) 10/2014   denies residual  on 07/11/2015  . ESRD (end stage renal disease) on dialysis [facility])    a. on dialysis; [facility]; MWF, LUE fistula (07/11/2015)  . History of blood transfusion    "related to gallbladder OR"  . History of stomach ulcers   . Hyperlipidemia   . Hypertension   . Iron deficiency anemia   . Myocardial infarction ([place]) 10/2014  . Peripheral vascular disease ([place])   . Pneumonia   . Presence of permanent cardiac pacemaker   . 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 Type II diabetes mellitus ([place])     Assessment: 54 yoM admitted 5/15 for left foot wound. Patient on apixaban 2.5 mg PO BID (last dose 5/15 AM) PTA for afib. Apixaban on hold now for surgery, given patient's CHADsVASc of 6, will initiate  heparin gtt.  Patient's Scr 4.47, but PMH significant for ESRD on HD TTS.  Patient's aPTT still remains slightly subtherapeutic at 65 (goal 66-102) on 1500 units/hr. No signs of bleeding or bruising per RN.  Goal of Therapy:  Heparin level 0.3-0.7 units/ml aPTT 66-102 seconds Monitor platelets by anticoagulation protocol: Yes   Plan:  Increase heparin infusion to 1600 units/hr  Check  aPTT level in 8 hours  Check heparin level and CBC daily while on heparin Continue to monitor H&H and platelets  Thank you for allowing pharmacy to be a part of this patient's care.  Leron Croak, PharmD PGY1 Pharmacy Resident Phone: [phone]  Please check AMION for all [facility] phone numbers 04/12/2019,11:53 AM

## 2019-04-12 NOTE — Progress Notes (Signed)
Pt seen by Dr Carlis Abbott on 5/15.  He discussed options of amputation with pt.  Prior agram shows unreconstructable disease.  Pt would prefer BKA rather than AKA.  Will schedule for left BKA on Tuesday with Dr Scot Dock Dr Scot Dock will discuss with pt and wife final plan tomorrow. Continue heparin bridging for now for afib will restart oral anticoagulation post op  On MTTS HD schedule will need to schedule around this  Discussed plan with pt today  Ruta Hinds, MD [facility]: [phone] Pager: [phone]

## 2019-04-13 LAB — CBC
HCT: 36.3 % — ABNORMAL LOW (ref 39.0–52.0)
Hemoglobin: 11.3 g/dL — ABNORMAL LOW (ref 13.0–17.0)
MCH: 28.5 pg (ref 26.0–34.0)
MCHC: 31.1 g/dL (ref 30.0–36.0)
MCV: 91.7 fL (ref 80.0–100.0)
Platelets: 231 10*3/uL (ref 150–400)
RBC: 3.96 MIL/uL — ABNORMAL LOW (ref 4.22–5.81)
RDW: 16.3 % — ABNORMAL HIGH (ref 11.5–15.5)
WBC: 9.7 10*3/uL (ref 4.0–10.5)
nRBC: 0 % (ref 0.0–0.2)

## 2019-04-13 LAB — APTT
aPTT: 135 seconds — ABNORMAL HIGH (ref 24–36)
aPTT: 81 seconds — ABNORMAL HIGH (ref 24–36)

## 2019-04-13 LAB — BASIC METABOLIC PANEL
Anion gap: 15 (ref 5–15)
BUN: 26 mg/dL — ABNORMAL HIGH (ref 8–23)
CO2: 25 mmol/L (ref 22–32)
Calcium: 9.3 mg/dL (ref 8.9–10.3)
Chloride: 89 mmol/L — ABNORMAL LOW (ref 98–111)
Creatinine, Ser: 4.08 mg/dL — ABNORMAL HIGH (ref 0.61–1.24)
GFR calc Af Amer: 15 mL/min — ABNORMAL LOW (ref >60)
GFR calc non Af Amer: 13 mL/min — ABNORMAL LOW (ref >60)
Glucose, Bld: 114 mg/dL — ABNORMAL HIGH (ref 70–99)
Potassium: 5.3 mmol/L — ABNORMAL HIGH (ref 3.5–5.1)
Sodium: 129 mmol/L — ABNORMAL LOW (ref 135–145)

## 2019-04-13 LAB — GLUCOSE, CAPILLARY
Glucose-Capillary: 138 mg/dL — ABNORMAL HIGH (ref 70–99)
Glucose-Capillary: 101 mg/dL — ABNORMAL HIGH (ref 70–99)
Glucose-Capillary: 106 mg/dL — ABNORMAL HIGH (ref 70–99)

## 2019-04-13 LAB — HEPARIN LEVEL (UNFRACTIONATED)
Heparin Unfractionated: 0.84 IU/mL — ABNORMAL HIGH (ref 0.30–0.70)
Heparin Unfractionated: 0.52 IU/mL (ref 0.30–0.70)

## 2019-04-13 MED ORDER — DOXERCALCIFEROL 4 MCG/2ML IV SOLN
INTRAVENOUS | Status: AC
  Administered 2019-04-13: 1 ug via INTRAVENOUS
  Filled 2019-04-13: qty 2

## 2019-04-13 MED ORDER — CEFAZOLIN SODIUM-DEXTROSE 1-4 GM/50ML-% IV SOLN: 1.0000 g | INTRAVENOUS | Status: DC

## 2019-04-13 MED ORDER — MIDODRINE HCL 5 MG PO TABS
ORAL_TABLET | ORAL | Status: AC
  Filled 2019-04-13: qty 2

## 2019-04-13 MED ORDER — MIDODRINE HCL 5 MG PO TABS
10.0000 mg | ORAL_TABLET | ORAL | Status: DC
  Administered 2019-04-16 – 2019-04-18 (×2): 10 mg via ORAL
  Filled 2019-04-13 (×2): qty 2

## 2019-04-13 MED ORDER — HEPARIN (PORCINE) 25000 UT/250ML-% IV SOLN
1400.0000 [IU]/h | INTRAVENOUS | Status: DC
  Administered 2019-04-13 (×2): 1400 [IU]/h via INTRAVENOUS
  Filled 2019-04-13 (×2): qty 250

## 2019-04-13 MED ORDER — ZOLPIDEM TARTRATE 5 MG PO TABS
5.0000 mg | ORAL_TABLET | Freq: Once | ORAL | Status: AC
  Administered 2019-04-13: 5 mg via ORAL
  Filled 2019-04-13: qty 1

## 2019-04-13 MED ORDER — VANCOMYCIN HCL IN DEXTROSE 1-5 GM/200ML-% IV SOLN
INTRAVENOUS | Status: AC
  Administered 2019-04-13: 1000 mg via INTRAVENOUS
  Filled 2019-04-13: qty 200

## 2019-04-13 NOTE — H&P (View-Only) (Signed)
   VASCULAR SURGERY ASSESSMENT & PLAN:   NONHEALING WOUNDS LEFT LEG: The patient is agreeable to proceed with left below the knee amputation tomorrow.  I have discussed the procedure and potential complications with the patient.  I have also talked to his wife over the phone.  They are agreeable to proceed with surgery tomorrow.  ANTICOAGULATION: I have written to stop his heparin at 5 AM.  His surgery is scheduled at 7:30.  He was on Eliquis prior to this admission.  This can be resumed postoperatively.  Check INR in a.m.  SUBJECTIVE:   No complaints.  He is currently on dialysis.  PHYSICAL EXAM:   Vitals:   04/13/19 0800 04/13/19 0830 04/13/19 0900 04/13/19 0930  BP: (!) 101/50 (!) 109/51 (!) 103/47 (!) 100/45  Pulse: 61 (!) 54 (!) 55 (!) 56  Resp: (!) 22 16    Temp:      TempSrc:      SpO2:      Weight:      Height:       No change to the wounds of the left leg.  LABS:   Lab Results  Component Value Date   WBC 9.7 04/13/2019   HGB 11.3 (L) 04/13/2019   HCT 36.3 (L) 04/13/2019   MCV 91.7 04/13/2019   PLT 231 04/13/2019   Lab Results  Component Value Date   CREATININE 4.08 (H) 04/13/2019   Lab Results  Component Value Date   INR 2.4 (H) 02/20/2019   CBG (last 3)  Recent Labs    04/12/19 1129 04/12/19 1621 04/12/19 2231  GLUCAP 145* 164* 173*    PROBLEM LIST:    Active Problems:   Diabetes type 2, controlled ([place])   ESRD (end stage renal disease) on dialysis (HCC)   Anemia of chronic disease   Automatic implantable cardioverter-defibrillator in situ   Chronic respiratory failure with hypoxia (HCC)   Troponin level elevated   Osteomyelitis (HCC)   CURRENT MEDS:   . atorvastatin  80 mg Oral QPM  . Chlorhexidine Gluconate Cloth  6 each Topical Q0600  . Chlorhexidine Gluconate Cloth  6 each Topical Q0600  . cinacalcet  30 mg Oral Q T,Th,Sat-1800  . doxercalciferol      . doxercalciferol  1 mcg Intravenous Q T,Th,Sa-HD  . insulin aspart  0-15 Units  Subcutaneous TID WC  . insulin aspart  0-5 Units Subcutaneous QHS  . insulin detemir  18 Units Subcutaneous Daily  . lidocaine-prilocaine  1 application Topical See admin instructions  . metoprolol tartrate  12.5 mg Oral 2 times per day on Sun Mon Wed Fri  . [START ON 04/14/2019] midodrine  10 mg Oral Once per day on Mon Tue Thu Sat  . midodrine  2.5 mg Oral TID WC  . mometasone-formoterol  2 puff Inhalation BID  . pantoprazole  40 mg Oral QPM  . sevelamer carbonate  2,400 mg Oral TID WC  . sodium chloride flush  3 mL Intravenous Q12H    [name] Beeper: [phone] Office: [phone] 04/13/2019

## 2019-04-13 NOTE — Progress Notes (Signed)
Subjective:  Seen on HD - BKA now planned for Tuesday  Objective Vital signs in last 24 hours: Vitals:   04/13/19 0742 04/13/19 0747 04/13/19 0800 04/13/19 0830  BP: (!) 102/45 (!) 105/47 (!) 101/50 (!) 109/51  Pulse: (!) 54 (!) 56 61 (!) 54  Resp: 17 18 (!) 22 16  Temp:      TempSrc:      SpO2:      Weight:      Height:       Weight change:   Intake/Output Summary (Last 24 hours) at 04/13/2019 0843 Last data filed at 04/13/2019 4944 Gross per 24 hour  Intake 932.31 ml  Output 0 ml  Net 932.31 ml    Dialysis Orders:  [facility]  4 hours Optiflux 200 400/800  EDW 97.5kg UFP 4 LUE AVF No heparin  Hectorol 1 mcg TIW   Assessment/ Plan: Pt is a 78 y.o. yo male with ESRD who was admitted on 04/10/2019 with left foot gangrene    1. 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  Empiric antibiotics- fortaz and vanc/blood cultures per primary.  For amputation per [facility]VS- plan is for BKA on Tuesday  2. ESRD -  HD 4x/week for volume. MTTS.  HD today on schedule via AVF. Will plan next for Wed off schedule given going to OR tomorrow  3. HTN/volume  - Chronic hypotension. On midodrine for BP support. UF to EDW as tolerated. Still over EDW 4. Anemia  - Hgb 11.3 Not on ESA as outpatient - 11.3 today , will watch  5. Metabolic bone disease -  Continue  Hectorol and Sensipar/Renvela binder.  6. AFib. S/p PM. On Eliquis  metoprolol. Holding Eliquis for surgery  7. DM Type 2. Hgb A1c 8.4%. Insulin per primary      Jeffrey [name]    Labs: Basic Metabolic Panel: Recent Labs  Lab 04/11/19 0514 04/11/19 2358 04/13/19 0332  NA 132* 134* 129*  K 5.1 4.1 5.3*  CL 89* 93* 89*  CO2 26 32 25  GLUCOSE 191* 114* 114*  BUN 33* 14 26*  CREATININE 4.47* 2.69* 4.08*  CALCIUM 9.2 8.7* 9.3   Liver Function Tests: Recent Labs  Lab 04/10/19 1305  AST 26  ALT 18  ALKPHOS 134*  BILITOT 1.8*  PROT 6.5  ALBUMIN 2.9*   No results for input(s): LIPASE, AMYLASE in the last  168 hours. No results for input(s): AMMONIA in the last 168 hours. CBC: Recent Labs  Lab 04/10/19 1400 04/11/19 0514 04/11/19 2358 04/13/19 0332  WBC 10.7* 10.4 10.9* 9.7  NEUTROABS 7.9*  --   --   --   HGB 11.3* 11.2* 10.5* 11.3*  HCT 36.2* 35.9* 32.9* 36.3*  MCV 91.6 91.1 90.6 91.7  PLT 215 200 195 231   Cardiac Enzymes: Recent Labs  Lab 04/10/19 1305 04/11/19 0514  TROPONINI 0.63* 0.61*   CBG: Recent Labs  Lab 04/11/19 2142 04/12/19 0719 04/12/19 1129 04/12/19 1621 04/12/19 2231  GLUCAP 75 115* 145* 164* 173*    Iron Studies: No results for input(s): IRON, TIBC, TRANSFERRIN, FERRITIN in the last 72 hours. Studies/Results: No results found. Medications: Infusions: . cefTAZidime (FORTAZ)  IV 1 g (04/12/19 2006)  . heparin 1,400 Units/hr (04/13/19 0831)  . vancomycin Stopped (04/11/19 2104)  . vancomycin      Scheduled Medications: . atorvastatin  80 mg Oral QPM  . Chlorhexidine Gluconate Cloth  6 each Topical Q0600  . Chlorhexidine Gluconate Cloth  6 each Topical  [id number]  . cinacalcet  30 mg Oral Q T,Th,Sat-1800  . doxercalciferol  1 mcg Intravenous Q T,Th,Sa-HD  . insulin aspart  0-15 Units Subcutaneous TID WC  . insulin aspart  0-5 Units Subcutaneous QHS  . insulin detemir  18 Units Subcutaneous Daily  . lidocaine-prilocaine  1 application Topical See admin instructions  . metoprolol tartrate  12.5 mg Oral 2 times per day on Sun Mon Wed Fri  . [START ON 04/14/2019] midodrine  10 mg Oral Once per day on Mon Tue Thu Sat  . midodrine  2.5 mg Oral TID WC  . mometasone-formoterol  2 puff Inhalation BID  . pantoprazole  40 mg Oral QPM  . sevelamer carbonate  2,400 mg Oral TID WC  . sodium chloride flush  3 mL Intravenous Q12H    have reviewed scheduled and prn medications.  Physical Exam: General: better disposition today  Heart: RRR Lungs: mostly clear Abdomen: soft, non tender Extremities: min edema- left foot wrapped  Dialysis Access: left AVF      04/13/2019,8:43 AM  LOS: 3 days

## 2019-04-13 NOTE — Progress Notes (Signed)
PROGRESS NOTE    Jeffrey [name]  MOL:[id number] DOB: [birth year] DOA: 04/10/2019 PCP: Scot Jun, FNP    Brief Narrative:   Jeffrey [name] is a 78 y.o. male with medical history significant of CHF, s/p pacemaker placement, s/p TAVR, A. fib on apixaban, CAD s/p stenting, ESRD on HD, DM type 2, PVD, and anemia; who presents with complaints of worsening left foot pain.  Admitted in March for cellulitis of the same foot, but reports that has not been getting better.  At this time he reports that he is wheelchair-bound and his son and wife help him with transfers at home.  He had an abdominal aortogram along with the lower extremities performed by Dr. Scot Dock on 5/1.  Based off of that study he was thought to have reasonable flow to the left foot with real good options for revascularization.  At that time he felt that his toes were similar to now with dark appearance of the second, third, and fourth toe.  He was advised to wash foot daily and keep it clean which he has done.  However, over the last 14 days he has developed progressively worsening pain that he describes as sharp and shooting.  He has been trying to take Tylenol without any relief of symptoms.  Denies having any significant fever, chills, abdominal pain, nausea, vomiting, or diarrhea.  He last had hemodialysis yesterday.  ED Course: On admission to the emergency department patient was noted to be afebrile, pulse 51-60, respirations 14-23, blood pressure 93/48-147/129, and O2 saturations maintained on 2 L nasal cannula oxygen.  Labs revealed WBC 10.7, hemoglobin 11.3, sodium 132, potassium 5, BUN 26, creatinine 3.72, glucose 222, lactic acid 2.1 ->2.2, and troponin  0.63.  X-rays revealed erosive osseous changes of the fourth digit left foot concerning for osteomyelitis.  Blood cultures were obtained.  Patient was given empiric antibiotics of vancomycin and Fortaz.   Assessment & Plan:   Active Problems:   Diabetes type  2, controlled ([place])   ESRD (end stage renal disease) on dialysis (HCC)   Anemia of chronic disease   Automatic implantable cardioverter-defibrillator in situ   Chronic respiratory failure with hypoxia (HCC)   Troponin level elevated   Osteomyelitis (HCC)   Sepsis, present on admission Left foot osteomyelitis with gangrene Patient presenting with progressive left foot pain with associated skin changes.  Noted to have elevated WBC count of 10.7, respirations 23, lactic acid 2.1 on admission.  CRP 10.1. Afebrile.  X-ray left foot notable for progressive osseous erosions concerning for osteomyelitis at the fourth distal phalanx. --Vascular surgery consulted, and following appreciate assistance --Empiric antibiotics with vancomycin and Tressie Ellis, pharmacy for assistance in dosing/monitoring --Pain control with Norco and IV morphine as needed --Vascular surgery planning BKA on Tuesday 5/19 --PT/OT evaluation following surgical intervention for discharge needs --Continue supportive care  Permanent atrial fibrillation History of PPM placement.  On Eliquis and metoprolol outpatient. --Hold Eliquis in anticipation of surgical intervention --heparin drip in the perioperative setting given elevated CHADS-VASc score of 6, pharmacy consulted --Continue rate control with metoprolol --Continue to monitor on telemetry  ESRD on HD [facility] in [place] on a Tuesday, Thursday, Saturday schedule. --Nephrology consulted for assistance with continued hemodialysis while inpatient  Type 2 diabetes mellitus Patient's home regimen includes Levemir 28 units nightly.  Patient states insulin dose recently reduced by PCP.  Previous hemoglobin A1c 6.3 on 01/07/2019. --Hemoglobin A1c up to 8.4, poorly controlled --Levemir at  reduced dose of 18 units daily --CBGs TIDAC --moderate SSI --Hypoglycemic protocol  Peripheral vascular disease --Plavix currently held due to possible need of surgical procedure   Hypotension: Chronic  Patient notes systolic blood pressures normally will be in the 80s.  Maps currently greater than 65. --Continue midodrine  Anemia of chronic disease:  Hemoglobin 11.3 g/dL.  Patient does not report any acute bleeding. --Follow CBC  COPD, without exacerbation chronic respiratory failure with hypoxia --Continue home 2 L of nasal cannula oxygen --Pharmacy substitution of Dulera for Symbicort --Albuterol nebs as needed for shortness of breath/wheezing  Aortic stenosis s/p TAVR: Stable  Anxiety --Continue Xanax as needed anxiety   DVT prophylaxis: SCDs, heparin drip Code Status: Full code Family Communication: None Disposition Plan: Continue inpatient hospitalization, BKA on Tuesday per vascular surgery.   Consultants:   Vascular surgery  Procedures:   None  Antimicrobials:   Vancomycin 5/15>>  Tressie Ellis 5/15>>   Subjective:  Patient seen following return from hemodialysis this morning.  Resting comfortably.  States pain to foot much improved with pain medication.  Vascular surgery planning BKA tomorrow.  No other complaints at this time. Denies headache, no fever/chills/night sweats, no chest pain, no palpitations, no abdominal pain, no issues with bowel/bladder function.  No acute events overnight per nursing staff.  Objective: Vitals:   04/13/19 1030 04/13/19 1100 04/13/19 1130 04/13/19 1143  BP: (!) 114/45 (!) 95/45 (!) 98/43 (!) 114/46  Pulse: (!) 52 60 (!) 56 62  Resp:    17  Temp:    97.8 F (36.6 C)  TempSrc:    Oral  SpO2:    97%  Weight:    99.2 kg  Height:        Intake/Output Summary (Last 24 hours) at 04/13/2019 1352 Last data filed at 04/13/2019 1143 Gross per 24 hour  Intake 452.31 ml  Output 3000 ml  Net -2547.69 ml   Filed Weights   04/11/19 2142 04/13/19 0735 04/13/19 1143  Weight: 100.4 kg 102.6 kg 99.2 kg    Examination:  General exam: Appears calm and comfortable  Respiratory system: Clear to auscultation.  Respiratory effort normal.  On 2 L nasal cannula which is his home dose Cardiovascular system: S1 & S2 heard, RRR. No JVD, murmurs, rubs, gallops or clicks. No pedal edema. Gastrointestinal system: Abdomen is nondistended, soft and nontender. No organomegaly or masses felt. Normal bowel sounds heard. Central nervous system: Alert and oriented. No focal neurological deficits. Extremities: Symmetric 5 x 5 power. Skin: Necrotic black appearance of dorsal aspect of 2nd, 3rd, 4th,and part of 5th toe with surrounding erythema Psychiatry: Judgement and insight appear normal. Mood & affect appropriate.     Data Reviewed: I have personally reviewed following labs and imaging studies  CBC: Recent Labs  Lab 04/10/19 1400 04/11/19 0514 04/11/19 2358 04/13/19 0332  WBC 10.7* 10.4 10.9* 9.7  NEUTROABS 7.9*  --   --   --   HGB 11.3* 11.2* 10.5* 11.3*  HCT 36.2* 35.9* 32.9* 36.3*  MCV 91.6 91.1 90.6 91.7  PLT 215 200 195 710   Basic Metabolic Panel: Recent Labs  Lab 04/10/19 1305 04/11/19 0514 04/11/19 2358 04/13/19 0332  NA 132* 132* 134* 129*  K 5.0 5.1 4.1 5.3*  CL 91* 89* 93* 89*  CO2 24 26 32 25  GLUCOSE 222* 191* 114* 114*  BUN 26* 33* 14 26*  CREATININE 3.72* 4.47* 2.69* 4.08*  CALCIUM 9.0 9.2 8.7* 9.3   GFR: Estimated Creatinine Clearance: 17.3  mL/min (A) (by C-G formula based on SCr of 4.08 mg/dL (H)). Liver Function Tests: Recent Labs  Lab 04/10/19 1305  AST 26  ALT 18  ALKPHOS 134*  BILITOT 1.8*  PROT 6.5  ALBUMIN 2.9*   No results for input(s): LIPASE, AMYLASE in the last 168 hours. No results for input(s): AMMONIA in the last 168 hours. Coagulation Profile: No results for input(s): INR, PROTIME in the last 168 hours. Cardiac Enzymes: Recent Labs  Lab 04/10/19 1305 04/11/19 0514  TROPONINI 0.63* 0.61*   BNP (last 3 results) No results for input(s): PROBNP in the last 8760 hours. HbA1C: Recent Labs    04/11/19 0514  HGBA1C 8.4*   CBG: Recent Labs   Lab 04/12/19 0719 04/12/19 1129 04/12/19 1621 04/12/19 2231 04/13/19 1231  GLUCAP 115* 145* 164* 173* 138*   Lipid Profile: No results for input(s): CHOL, HDL, LDLCALC, TRIG, CHOLHDL, LDLDIRECT in the last 72 hours. Thyroid Function Tests: No results for input(s): TSH, T4TOTAL, FREET4, T3FREE, THYROIDAB in the last 72 hours. Anemia Panel: No results for input(s): VITAMINB12, FOLATE, FERRITIN, TIBC, IRON, RETICCTPCT in the last 72 hours. Sepsis Labs: Recent Labs  Lab 04/10/19 1430 04/10/19 1534  LATICACIDVEN 2.1* 2.2*    Recent Results (from the past 240 hour(s))  SARS Coronavirus 2 (CEPHEID - Performed in [place] hospital lab), Hosp Order     Status: None   Collection Time: 04/10/19 12:56 PM  Result Value Ref Range Status   SARS Coronavirus 2 NEGATIVE NEGATIVE Final    Comment: (NOTE) If result is NEGATIVE SARS-CoV-2 target nucleic acids are NOT DETECTED. The SARS-CoV-2 RNA is generally detectable in upper and lower  respiratory specimens during the acute phase of infection. The lowest  concentration of SARS-CoV-2 viral copies this assay can detect is 250  copies / mL. A negative result does not preclude SARS-CoV-2 infection  and should not be used as the sole basis for treatment or other  patient management decisions.  A negative result may occur with  improper specimen collection / handling, submission of specimen other  than nasopharyngeal swab, presence of viral mutation(s) within the  areas targeted by this assay, and inadequate number of viral copies  (<250 copies / mL). A negative result must be combined with clinical  observations, patient history, and epidemiological information. If result is POSITIVE SARS-CoV-2 target nucleic acids are DETECTED. The SARS-CoV-2 RNA is generally detectable in upper and lower  respiratory specimens dur ing the acute phase of infection.  Positive  results are indicative of active infection with SARS-CoV-2.  Clinical   correlation with patient history and other diagnostic information is  necessary to determine patient infection status.  Positive results do  not rule out bacterial infection or co-infection with other viruses. If result is PRESUMPTIVE POSTIVE SARS-CoV-2 nucleic acids MAY BE PRESENT.   A presumptive positive result was obtained on the submitted specimen  and confirmed on repeat testing.  While 2019 novel coronavirus  (SARS-CoV-2) nucleic acids may be present in the submitted sample  additional confirmatory testing may be necessary for epidemiological  and / or clinical management purposes  to differentiate between  SARS-CoV-2 and other Sarbecovirus currently known to infect humans.  If clinically indicated additional testing with an alternate test  methodology [phone]) is advised. The SARS-CoV-2 RNA is generally  detectable in upper and lower respiratory sp ecimens during the acute  phase of infection. The expected result is Negative. Fact Sheet for Patients:  StrictlyIdeas.no Fact Sheet for Healthcare Providers:  BankingDealers.co.za This test is not yet approved or cleared by the [place] and has been authorized for detection and/or diagnosis of SARS-CoV-2 by FDA under an Emergency Use Authorization (EUA).  This EUA will remain in effect (meaning this test can be used) for the duration of the COVID-19 declaration under Section 564(b)(1) of the Act, 21 U.S.C. section 360bbb-3(b)(1), unless the authorization is terminated or revoked sooner. Performed at [facility], [place] [address], [place], [place] [address]   Culture, blood (routine x 2)     Status: None (Preliminary result)   Collection Time: 04/10/19  2:04 PM  Result Value Ref Range Status   Specimen Description BLOOD RIGHT ANTECUBITAL  Final   Special Requests   Final    AEROBIC BOTTLE ONLY Blood Culture results may not be optimal due to an excessive volume of blood  received in culture bottles   Culture   Final    NO GROWTH 3 DAYS Performed at [facility], [place] [address] [place], [place] [address]    Report Status PENDING  Incomplete  MRSA PCR Screening     Status: None   Collection Time: 04/10/19  5:17 PM  Result Value Ref Range Status   MRSA by PCR NEGATIVE NEGATIVE Final    Comment:        The GeneXpert MRSA Assay (FDA approved for NASAL specimens only), is one component of a comprehensive MRSA colonization surveillance program. It is not intended to diagnose MRSA infection nor to guide or monitor treatment for MRSA infections. Performed at [facility], [place] [address], [place], [place] [address]   Culture, blood (routine x 2)     Status: None (Preliminary result)   Collection Time: 04/10/19  5:54 PM  Result Value Ref Range Status   Specimen Description BLOOD RIGHT HAND  Final   Special Requests   Final    BOTTLES DRAWN AEROBIC AND ANAEROBIC Blood Culture adequate volume   Culture   Final    NO GROWTH 3 DAYS Performed at [facility], [place] [address] [place],  [address]    Report Status PENDING  Incomplete         Radiology Studies: No results found.      Scheduled Meds: . atorvastatin  80 mg Oral QPM  . Chlorhexidine Gluconate Cloth  6 each Topical Q0600  . Chlorhexidine Gluconate Cloth  6 each Topical Q0600  . cinacalcet  30 mg Oral Q T,Th,Sat-1800  . doxercalciferol  1 mcg Intravenous Q T,Th,Sa-HD  . insulin aspart  0-15 Units Subcutaneous TID WC  . insulin aspart  0-5 Units Subcutaneous QHS  . insulin detemir  18 Units Subcutaneous Daily  . lidocaine-prilocaine  1 application Topical See admin instructions  . metoprolol tartrate  12.5 mg Oral 2 times per day on Sun Mon Wed Fri  . [START ON 04/14/2019] midodrine  10 mg Oral Once per day on Mon Tue Thu Sat  . midodrine  2.5 mg Oral TID WC  . mometasone-formoterol  2 puff Inhalation BID  . pantoprazole  40 mg Oral QPM  . sevelamer  carbonate  2,400 mg Oral TID WC  . sodium chloride flush  3 mL Intravenous Q12H   Continuous Infusions: . cefTAZidime (FORTAZ)  IV 1 g (04/12/19 2006)  . heparin 1,400 Units/hr (04/13/19 0831)  . vancomycin Stopped (04/11/19 2104)     LOS: 3 days    Time spent: 29 minutes    Donnamarie Poag  [place] [facility] Pager [phone]  If 7PM-7AM, please contact night-coverage [email or web address] Password [email or web address] 04/13/2019, 1:52 PM

## 2019-04-13 NOTE — Progress Notes (Signed)
Patient complained of SOB and prn Albuterol was given. Pt verbalized Urelief of SOB. Patient had Oxygen level of 96, RR of 18, Bp 99/39 and HR 59 at this time and Schorr, NP text paged. Orders taken in [place]. Will continue to monitor.

## 2019-04-13 NOTE — Progress Notes (Signed)
   04/13/19 1700  Clinical Encounter Type  Visited With Patient;Health care provider  Visit Type Initial;Psychological support;Pre-op  Referral From Nurse  Spiritual Encounters  Spiritual Needs Emotional  Stress Factors  Patient Stress Factors Exhausted;Loss of control;Major life changes   Met w/ pt at referral of [facility] [place].  Pt is having BKA; pt was "dreading" it.  Michela Pitcher he has been on his own since 78 yo.  Has been married to wife for 9 yrs; "don't know why she puts up with me."  Pt kept dozing off while conversing.  Said he had been resting plenty, but after a few times I stepped out so he could sleep.  Myra Gianotti resident, [phone]

## 2019-04-13 NOTE — Progress Notes (Signed)
[place] for heparin Indication: atrial fibrillation  Allergies  Allergen Reactions  . Byetta 10 Mcg Pen [Exenatide] Diarrhea and Nausea And Vomiting  . Codeine Itching  . Coumadin [Warfarin Sodium] Rash    (wife states coumadin was stopped but rash did not disappear 02/21/19)   Patient Measurements: Height: 5\' 11"  (180.3 cm) Weight: 221 lb 5.5 oz (100.4 kg) IBW/kg (Calculated) : 75.3 Heparin Dosing Weight: 96.2 kg  Vital Signs: Temp: 98.6 F (37 C) (05/18 0457) Temp Source: Oral (05/18 0457) BP: 95/50 (05/18 0457) Pulse Rate: 56 (05/18 0457)  Labs: Recent Labs    04/10/19 1305  04/11/19 0514  04/11/19 1351 04/11/19 2358  04/12/19 1125 04/12/19 2003 04/13/19 0332  HGB  --    < > 11.2*  --   --  10.5*  --   --   --  11.3*  HCT  --    < > 35.9*  --   --  32.9*  --   --   --  36.3*  PLT  --    < > 200  --   --  195  --   --   --  231  APTT  --   --   --    < > 42* >200*   < > 65* 92* 135*  HEPARINUNFRC  --   --   --   --  1.30* 1.90*  --   --   --  0.84*  CREATININE 3.72*  --  4.47*  --   --  2.69*  --   --   --  4.08*  TROPONINI 0.63*  --  0.61*  --   --   --   --   --   --   --    < > = values in this interval not displayed.   Estimated Creatinine Clearance: 17.4 mL/min (A) (by C-G formula based on SCr of 4.08 mg/dL (H)).  Assessment: 48 yoM admitted 5/15 for left foot wound. PMH afib on Eliquis PTA (last dose 5/15 AM), currently on hold for surgery. Pharmacy consulted to dose IV heparin.   APTT supratherapeutic at 135 seconds, and heparin level 0.84  Goal of Therapy:  Heparin level 0.3-0.7 units/ml aPTT 66-102 seconds Monitor platelets by anticoagulation protocol: Yes   Plan: Decrease heparin gtt to 1400 units/hr F/u 8 hour aPTT/HL  Bertis Ruddy, PharmD Clinical Pharmacist Please check AMION for all [place] numbers 04/13/2019 7:17 AM

## 2019-04-13 NOTE — Progress Notes (Addendum)
[place] for heparin Indication: atrial fibrillation  Allergies  Allergen Reactions  . Byetta 10 Mcg Pen [Exenatide] Diarrhea and Nausea And Vomiting  . Codeine Itching  . Coumadin [Warfarin Sodium] Rash    (wife states coumadin was stopped but rash did not disappear 02/21/19)   Patient Measurements: Height: 5\' 11"  (180.3 cm) Weight: 218 lb 11.1 oz (99.2 kg) IBW/kg (Calculated) : 75.3 Heparin Dosing Weight: 96.2 kg  Vital Signs: Temp: 97.8 F (36.6 C) (05/18 1143) Temp Source: Oral (05/18 1143) BP: 114/46 (05/18 1143) Pulse Rate: 62 (05/18 1143)  Labs: Recent Labs    04/11/19 0514  04/11/19 2358  04/12/19 2003 04/13/19 0332 04/13/19 1519  HGB 11.2*  --  10.5*  --   --  11.3*  --   HCT 35.9*  --  32.9*  --   --  36.3*  --   PLT 200  --  195  --   --  231  --   APTT  --    < > >200*   < > 92* 135* 81*  HEPARINUNFRC  --    < > 1.90*  --   --  0.84* 0.52  CREATININE 4.47*  --  2.69*  --   --  4.08*  --   TROPONINI 0.61*  --   --   --   --   --   --    < > = values in this interval not displayed.   Estimated Creatinine Clearance: 17.3 mL/min (A) (by C-G formula based on SCr of 4.08 mg/dL (H)).  Assessment: 22 yoM admitted 5/15 for left foot wound. PMH afib on Eliquis PTA (last dose 5/15 AM), currently on hold for surgery. Pharmacy consulted to dose IV heparin.   APTT therapeutic at 81 seconds, and heparin level 0.52  Heparin drip to be held at 0500 on 5/19 for surgery. Stop date in.   Goal of Therapy:  Heparin level 0.3-0.7 units/ml aPTT 66-102 seconds Monitor platelets by anticoagulation protocol: Yes   Plan: Continue heparin gtt at 1400 units/hr F/u 8 hour confirmatory HL F/u restart of anticoagulation after surgery.   Staisha Winiarski A. Levada Dy, PharmD, [place] Please utilize Amion for appropriate phone number to reach the unit pharmacist ([place])   04/13/2019 4:32 PM

## 2019-04-13 NOTE — Progress Notes (Addendum)
   VASCULAR SURGERY ASSESSMENT & PLAN:   NONHEALING WOUNDS LEFT LEG: The patient is agreeable to proceed with left below the knee amputation tomorrow.  I have discussed the procedure and potential complications with the patient.  I have also talked to his wife over the phone.  They are agreeable to proceed with surgery tomorrow.  ANTICOAGULATION: I have written to stop his heparin at 5 AM.  His surgery is scheduled at 7:30.  He was on Eliquis prior to this admission.  This can be resumed postoperatively.  Check INR in a.m.  SUBJECTIVE:   No complaints.  He is currently on dialysis.  PHYSICAL EXAM:   Vitals:   04/13/19 0800 04/13/19 0830 04/13/19 0900 04/13/19 0930  BP: (!) 101/50 (!) 109/51 (!) 103/47 (!) 100/45  Pulse: 61 (!) 54 (!) 55 (!) 56  Resp: (!) 22 16    Temp:      TempSrc:      SpO2:      Weight:      Height:       No change to the wounds of the left leg.  LABS:   Lab Results  Component Value Date   WBC 9.7 04/13/2019   HGB 11.3 (L) 04/13/2019   HCT 36.3 (L) 04/13/2019   MCV 91.7 04/13/2019   PLT 231 04/13/2019   Lab Results  Component Value Date   CREATININE 4.08 (H) 04/13/2019   Lab Results  Component Value Date   INR 2.4 (H) 02/20/2019   CBG (last 3)  Recent Labs    04/12/19 1129 04/12/19 1621 04/12/19 2231  GLUCAP 145* 164* 173*    PROBLEM LIST:    Active Problems:   Diabetes type 2, controlled ([place])   ESRD (end stage renal disease) on dialysis ([facility])   Anemia of chronic disease   Automatic implantable cardioverter-defibrillator in situ   Chronic respiratory failure with hypoxia (H[facility])   Troponin level elevated   Osteomyelitis ([facility])   CURRENT MEDS:   . atorvastatin  80 mg Oral QPM  . Chlorhexidine Gluconate Cloth  6 each Topical Q0600  . Chlorhexidine Gluconate Cloth  6 each Topical Q0600  . cinacalcet  30 mg Oral Q T,Th,Sat-1800  . doxercalciferol      . doxercalciferol  1 mcg Intravenous Q T,Th,Sa-HD  . insulin aspart  0-15 Units  Subcutaneous TID WC  . insulin aspart  0-5 Units Subcutaneous QHS  . insulin detemir  18 Units Subcutaneous Daily  . lidocaine-prilocaine  1 application Topical See admin instructions  . metoprolol tartrate  12.5 mg Oral 2 times per day on Sun Mon Wed Fri  . [START ON 04/14/2019] midodrine  10 mg Oral Once per day on Mon Tue Thu Sat  . midodrine  2.5 mg Oral TID WC  . mometasone-formoterol  2 puff Inhalation BID  . pantoprazole  40 mg Oral QPM  . sevelamer carbonate  2,400 mg Oral TID WC  . sodium chloride flush  3 mL Intravenous Q12H    [name] Beeper: [phone] Office: [phone] 04/13/2019

## 2019-04-13 NOTE — Procedures (Signed)
Patient was seen on dialysis and the procedure was supervised.  BFR 400  Via AVF BP is  97/51.   Patient appears to be tolerating treatment well  Jeffrey [name] 04/13/2019

## 2019-04-13 NOTE — Progress Notes (Signed)
Called to patient room per RN stating that patient was SOB, RRT came soon after call. On arrival noted patient getting ready to sip some tea on a 2L [place]. Patient states that he is not SOB at this time. Patient is talking in full unbroken sentences, no signs of respiratory distress/SOB, accessory muscle use, or respiratory compromise noted at this time. Patient states that he is claustrophobic. Patient may have some anxiety. BBS to auscultation reveals Clear apical aeration and decrease aeration in the bilateral bases. RN at bedside. Patient is clinically stable at this time.

## 2019-04-14 ENCOUNTER — Inpatient Hospital Stay (HOSPITAL_COMMUNITY): Payer: Medicare Other | Admitting: Certified Registered Nurse Anesthetist

## 2019-04-14 ENCOUNTER — Encounter (HOSPITAL_COMMUNITY)
Admission: EM | Disposition: A | Discharge status: Home Health | Payer: Medicare Other | Source: Home / Self Care | Attending: Internal Medicine

## 2019-04-14 DIAGNOSIS — I70244 Atherosclerosis of native arteries of left leg with ulceration of heel and midfoot: Secondary | ICD-10-CM

## 2019-04-14 HISTORY — PX: AMPUTATION: SHX166

## 2019-04-14 LAB — BASIC METABOLIC PANEL
Anion gap: 13 (ref 5–15)
BUN: 17 mg/dL (ref 8–23)
CO2: 28 mmol/L (ref 22–32)
Calcium: 9.6 mg/dL (ref 8.9–10.3)
Chloride: 95 mmol/L — ABNORMAL LOW (ref 98–111)
Creatinine, Ser: 2.97 mg/dL — ABNORMAL HIGH (ref 0.61–1.24)
GFR calc Af Amer: 22 mL/min — ABNORMAL LOW (ref >60)
GFR calc non Af Amer: 19 mL/min — ABNORMAL LOW (ref >60)
Glucose, Bld: 124 mg/dL — ABNORMAL HIGH (ref 70–99)
Potassium: 4.2 mmol/L (ref 3.5–5.1)
Sodium: 136 mmol/L (ref 135–145)

## 2019-04-14 LAB — CBC
HCT: 34.4 % — ABNORMAL LOW (ref 39.0–52.0)
Hemoglobin: 10.8 g/dL — ABNORMAL LOW (ref 13.0–17.0)
MCH: 28.9 pg (ref 26.0–34.0)
MCHC: 31.4 g/dL (ref 30.0–36.0)
MCV: 92 fL (ref 80.0–100.0)
Platelets: 201 10*3/uL (ref 150–400)
RBC: 3.74 MIL/uL — ABNORMAL LOW (ref 4.22–5.81)
RDW: 16.5 % — ABNORMAL HIGH (ref 11.5–15.5)
WBC: 10.9 10*3/uL — ABNORMAL HIGH (ref 4.0–10.5)
nRBC: 0 % (ref 0.0–0.2)

## 2019-04-14 LAB — GLUCOSE, CAPILLARY
Glucose-Capillary: 102 mg/dL — ABNORMAL HIGH (ref 70–99)
Glucose-Capillary: 109 mg/dL — ABNORMAL HIGH (ref 70–99)
Glucose-Capillary: 112 mg/dL — ABNORMAL HIGH (ref 70–99)
Glucose-Capillary: 153 mg/dL — ABNORMAL HIGH (ref 70–99)
Glucose-Capillary: 160 mg/dL — ABNORMAL HIGH (ref 70–99)

## 2019-04-14 LAB — SURGICAL PCR SCREEN
MRSA, PCR: NEGATIVE
Staphylococcus aureus: NEGATIVE

## 2019-04-14 LAB — PROTIME-INR
INR: 1.7 — ABNORMAL HIGH (ref 0.8–1.2)
Prothrombin Time: 19.8 seconds — ABNORMAL HIGH (ref 11.4–15.2)

## 2019-04-14 LAB — HEPARIN LEVEL (UNFRACTIONATED): Heparin Unfractionated: 0.45 IU/mL (ref 0.30–0.70)

## 2019-04-14 SURGERY — AMPUTATION BELOW KNEE
Anesthesia: Choice | Laterality: Left

## 2019-04-14 MED ORDER — ONDANSETRON HCL 4 MG/2ML IJ SOLN
INTRAMUSCULAR | Status: DC | PRN
  Administered 2019-04-14: 4 mg via INTRAVENOUS

## 2019-04-14 MED ORDER — SODIUM CHLORIDE 0.9 % IV SOLN
INTRAVENOUS | Status: DC | PRN
  Administered 2019-04-14: 08:00:00 60 ug/min via INTRAVENOUS

## 2019-04-14 MED ORDER — PHENYLEPHRINE HCL-NACL 10-0.9 MG/250ML-% IV SOLN
INTRAVENOUS | Status: AC
  Filled 2019-04-14: qty 500

## 2019-04-14 MED ORDER — HYDROCODONE-ACETAMINOPHEN 5-325 MG PO TABS
1.0000 | ORAL_TABLET | ORAL | Status: DC | PRN
  Administered 2019-04-14 – 2019-04-15 (×4): 2 via ORAL
  Filled 2019-04-14 (×4): qty 2

## 2019-04-14 MED ORDER — ONDANSETRON HCL 4 MG/2ML IJ SOLN
INTRAMUSCULAR | Status: AC
  Filled 2019-04-14: qty 2

## 2019-04-14 MED ORDER — SUCCINYLCHOLINE CHLORIDE 20 MG/ML IJ SOLN
INTRAMUSCULAR | Status: DC | PRN
  Administered 2019-04-14: 120 mg via INTRAVENOUS

## 2019-04-14 MED ORDER — VANCOMYCIN HCL IN DEXTROSE 1-5 GM/200ML-% IV SOLN
1000.0000 mg | INTRAVENOUS | Status: DC
  Filled 2019-04-14: qty 200

## 2019-04-14 MED ORDER — LIDOCAINE 2% (20 MG/ML) 5 ML SYRINGE
INTRAMUSCULAR | Status: DC | PRN
  Administered 2019-04-14: 60 mg via INTRAVENOUS
  Administered 2019-04-14: 40 mg via INTRAVENOUS

## 2019-04-14 MED ORDER — 0.9 % SODIUM CHLORIDE (POUR BTL) OPTIME
TOPICAL | Status: DC | PRN
  Administered 2019-04-14: 1000 mL

## 2019-04-14 MED ORDER — FENTANYL CITRATE (PF) 100 MCG/2ML IJ SOLN
25.0000 ug | INTRAMUSCULAR | Status: DC | PRN
  Administered 2019-04-14 (×2): 25 ug via INTRAVENOUS

## 2019-04-14 MED ORDER — FENTANYL CITRATE (PF) 100 MCG/2ML IJ SOLN
INTRAMUSCULAR | Status: AC
  Filled 2019-04-14: qty 2

## 2019-04-14 MED ORDER — LIDOCAINE 2% (20 MG/ML) 5 ML SYRINGE
INTRAMUSCULAR | Status: AC
  Filled 2019-04-14: qty 5

## 2019-04-14 MED ORDER — SODIUM CHLORIDE 0.9 % IV SOLN
INTRAVENOUS | Status: DC | PRN
  Administered 2019-04-14: 08:00:00 via INTRAVENOUS

## 2019-04-14 MED ORDER — FENTANYL CITRATE (PF) 250 MCG/5ML IJ SOLN
INTRAMUSCULAR | Status: DC | PRN
  Administered 2019-04-14 (×2): 50 ug via INTRAVENOUS

## 2019-04-14 MED ORDER — PROPOFOL 10 MG/ML IV BOLUS
INTRAVENOUS | Status: AC
  Filled 2019-04-14: qty 20

## 2019-04-14 MED ORDER — FENTANYL CITRATE (PF) 250 MCG/5ML IJ SOLN
INTRAMUSCULAR | Status: AC
  Filled 2019-04-14: qty 5

## 2019-04-14 MED ORDER — DEXAMETHASONE SODIUM PHOSPHATE 10 MG/ML IJ SOLN
INTRAMUSCULAR | Status: AC
  Filled 2019-04-14: qty 1

## 2019-04-14 MED ORDER — DEXAMETHASONE SODIUM PHOSPHATE 10 MG/ML IJ SOLN
INTRAMUSCULAR | Status: DC | PRN
  Administered 2019-04-14: 4 mg via INTRAVENOUS

## 2019-04-14 MED ORDER — BACITRACIN ZINC 500 UNIT/GM EX OINT
TOPICAL_OINTMENT | CUTANEOUS | Status: AC
  Filled 2019-04-14: qty 28.35

## 2019-04-14 MED ORDER — HEPARIN (PORCINE) 25000 UT/250ML-% IV SOLN
1400.0000 [IU]/h | INTRAVENOUS | Status: AC
  Administered 2019-04-14 – 2019-04-15 (×2): 1400 [IU]/h via INTRAVENOUS
  Filled 2019-04-14 (×2): qty 250

## 2019-04-14 MED ORDER — PROPOFOL 10 MG/ML IV BOLUS
INTRAVENOUS | Status: DC | PRN
  Administered 2019-04-14: 20 mg via INTRAVENOUS
  Administered 2019-04-14: 30 mg via INTRAVENOUS
  Administered 2019-04-14: 120 mg via INTRAVENOUS
  Administered 2019-04-14: 30 mg via INTRAVENOUS

## 2019-04-14 MED ORDER — BACITRACIN ZINC 500 UNIT/GM EX OINT
TOPICAL_OINTMENT | CUTANEOUS | Status: DC | PRN
  Administered 2019-04-14: 1 via TOPICAL

## 2019-04-14 MED ORDER — MORPHINE SULFATE (PF) 2 MG/ML IV SOLN
2.0000 mg | INTRAVENOUS | Status: DC | PRN
  Administered 2019-04-14 (×2): 2 mg via INTRAVENOUS
  Administered 2019-04-15: 4 mg via INTRAVENOUS
  Administered 2019-04-15 – 2019-04-16 (×3): 2 mg via INTRAVENOUS
  Filled 2019-04-14 (×2): qty 1
  Filled 2019-04-14 (×2): qty 2
  Filled 2019-04-14 (×3): qty 1

## 2019-04-14 MED ORDER — ONDANSETRON HCL 4 MG/2ML IJ SOLN: 4.0000 mg | Freq: Once | INTRAMUSCULAR | Status: DC | PRN

## 2019-04-14 MED ORDER — VANCOMYCIN VARIABLE DOSE PER UNSTABLE RENAL FUNCTION (PHARMACIST DOSING): Status: DC

## 2019-04-14 MED ORDER — PHENYLEPHRINE 40 MCG/ML (10ML) SYRINGE FOR IV PUSH (FOR BLOOD PRESSURE SUPPORT)
PREFILLED_SYRINGE | INTRAVENOUS | Status: DC | PRN
  Administered 2019-04-14 (×2): 80 ug via INTRAVENOUS

## 2019-04-14 SURGICAL SUPPLY — 54 items
BANDAGE ACE 4X5 VEL STRL LF (GAUZE/BANDAGES/DRESSINGS) ×4 IMPLANT
BANDAGE ELASTIC 4 VELCRO ST LF (GAUZE/BANDAGES/DRESSINGS) ×1 IMPLANT
BANDAGE ESMARK 6X9 LF (GAUZE/BANDAGES/DRESSINGS) ×1 IMPLANT
BLADE SAW RECIP 87.9 MT (BLADE) ×2 IMPLANT
BNDG CMPR 9X6 STRL LF SNTH (GAUZE/BANDAGES/DRESSINGS) ×1
BNDG COHESIVE 6X5 TAN STRL LF (GAUZE/BANDAGES/DRESSINGS) ×2 IMPLANT
BNDG ESMARK 6X9 LF (GAUZE/BANDAGES/DRESSINGS) ×2
BNDG GAUZE ELAST 4 BULKY (GAUZE/BANDAGES/DRESSINGS) ×3 IMPLANT
CANISTER SUCT 3000ML PPV (MISCELLANEOUS) ×4 IMPLANT
CLIP VESOCCLUDE MED 6/CT (CLIP) IMPLANT
COVER SURGICAL LIGHT HANDLE (MISCELLANEOUS) ×2 IMPLANT
COVER WAND RF STERILE (DRAPES) ×2 IMPLANT
CUFF TOURN SGL QUICK 34 (TOURNIQUET CUFF) ×2
CUFF TOURNIQUET SINGLE 24IN (TOURNIQUET CUFF) IMPLANT
CUFF TOURNIQUET SINGLE 34IN LL (TOURNIQUET CUFF) IMPLANT
CUFF TOURNIQUET SINGLE 44IN (TOURNIQUET CUFF) IMPLANT
CUFF TRNQT CYL 34X4.125X (TOURNIQUET CUFF) IMPLANT
DRAIN CHANNEL 19F RND (DRAIN) IMPLANT
DRAPE HALF SHEET 40X57 (DRAPES) ×2 IMPLANT
DRAPE ORTHO SPLIT 77X108 STRL (DRAPES) ×4
DRAPE SURG ORHT 6 SPLT 77X108 (DRAPES) ×2 IMPLANT
DRAPE U-SHAPE 47X51 STRL (DRAPES) ×2 IMPLANT
DRSG ADAPTIC 3X8 NADH LF (GAUZE/BANDAGES/DRESSINGS) ×2 IMPLANT
ELECT REM PT RETURN 9FT ADLT (ELECTROSURGICAL) ×2
ELECTRODE REM PT RTRN 9FT ADLT (ELECTROSURGICAL) ×1 IMPLANT
EVACUATOR SILICONE 100CC (DRAIN) IMPLANT
GAUZE SPONGE 4X4 12PLY STRL (GAUZE/BANDAGES/DRESSINGS) ×4 IMPLANT
GAUZE SPONGE 4X4 12PLY STRL LF (GAUZE/BANDAGES/DRESSINGS) ×1 IMPLANT
GLOVE BIO SURGEON STRL SZ7.5 (GLOVE) ×2 IMPLANT
GLOVE BIOGEL PI IND STRL 8 (GLOVE) ×1 IMPLANT
GLOVE BIOGEL PI INDICATOR 8 (GLOVE) ×1
GOWN STRL REUS W/ TWL LRG LVL3 (GOWN DISPOSABLE) ×3 IMPLANT
GOWN STRL REUS W/TWL LRG LVL3 (GOWN DISPOSABLE) ×6
KIT BASIN OR (CUSTOM PROCEDURE TRAY) ×2 IMPLANT
KIT TURNOVER KIT B (KITS) ×2 IMPLANT
NS IRRIG 1000ML POUR BTL (IV SOLUTION) ×2 IMPLANT
PACK GENERAL/GYN (CUSTOM PROCEDURE TRAY) ×2 IMPLANT
PAD ARMBOARD 7.5X6 YLW CONV (MISCELLANEOUS) ×4 IMPLANT
RASP HELIOCORDIAL MED (MISCELLANEOUS) IMPLANT
SPONGE LAP 18X18 RF (DISPOSABLE) ×1 IMPLANT
STAPLER VISISTAT (STAPLE) ×2 IMPLANT
STOCKINETTE IMPERVIOUS LG (DRAPES) ×2 IMPLANT
SUT ETHILON 3 0 PS 1 (SUTURE) IMPLANT
SUT SILK 0 TIES 10X30 (SUTURE) ×2 IMPLANT
SUT SILK 2 0 (SUTURE) ×2
SUT SILK 2 0 SH CR/8 (SUTURE) ×3 IMPLANT
SUT SILK 2-0 18XBRD TIE 12 (SUTURE) ×1 IMPLANT
SUT SILK 3 0 (SUTURE) ×2
SUT SILK 3-0 18XBRD TIE 12 (SUTURE) ×1 IMPLANT
SUT VIC AB 2-0 CT1 18 (SUTURE) ×3 IMPLANT
TAPE CLOTH SURG 4X10 WHT LF (GAUZE/BANDAGES/DRESSINGS) ×1 IMPLANT
TOWEL GREEN STERILE (TOWEL DISPOSABLE) ×3 IMPLANT
UNDERPAD 30X30 (UNDERPADS AND DIAPERS) ×2 IMPLANT
WATER STERILE IRR 1000ML POUR (IV SOLUTION) ×2 IMPLANT

## 2019-04-14 NOTE — Evaluation (Signed)
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 PMh includes CAD, ESRD on HD TTS, DM, CHF, pacemaker, s/p TAVR, A-fib.  Clinical Impression  Patient presents with decreased arousal, pain, impaired AROM/sensation, post surgical deficits and impaired mobility s/p above. Pt is w/c bound and requires assist for transfers PTA. His wife assists with ADLs PTA. Today, pt confused after surgery worried that his family was being held hostage. He tolerated bed mobility and sitting balance with Min-Max A for support/safety. Instructed pt in there ex. Left residual limb is lacking full knee extension. Noted to have bleeding through bandage. Education re: phantom limb pain, positioning etc. Would benefit from SNF to maximize independence and mobility prior to return home. Will follow acutely.    Follow Up Recommendations SNF;Supervision for mobility/OOB;Supervision/Assistance - 24 hour    Equipment Recommendations  None recommended by PT    Recommendations for Other Services       Precautions / Restrictions Precautions Precautions: Fall Restrictions Weight Bearing Restrictions: No      Mobility  Bed Mobility Overal bed mobility: Needs Assistance Bed Mobility: Rolling;Sidelying to Sit;Sit to Sidelying Rolling: Mod assist Sidelying to sit: Max assist;HOB elevated     Sit to sidelying: Max assist;HOB elevated General bed mobility comments: Step by step cues for technique, able to reach for rail, assist with trunk and to scoot bottom to EOB; assist to bring LEs into bed to return to supine.  Transfers                 General transfer comment: Deferred as pt with decreased arousal.  Ambulation/Gait                Stairs            Wheelchair Mobility    Modified  Rankin (Stroke Patients Only)       Balance Overall balance assessment: Needs assistance Sitting-balance support: Single extremity supported(foot supported) Sitting balance-Leahy Scale: Poor Sitting balance - Comments: Able to sit with close min guard -Mod A for sitting balance; decreased arousal at times. Postural control: Right lateral lean                                   Pertinent Vitals/Pain Pain Assessment: 0-10 Pain Score: 8  Pain Location: left residual limb Pain Descriptors / Indicators: Burning;Sore Pain Intervention(s): Monitored during session;Other (comment)    Home Living Family/patient expects to be discharged to:: Private residence Living Arrangements: Spouse/significant other;Children(son) Available Help at Discharge: Family;Available 24 hours/day Type of Home: House Home Access: Ramped entrance     Home Layout: One level Home Equipment: Walker - 2 wheels;Grab bars - toilet;Grab bars - tub/shower;Cane - single point;Bedside commode;Walker - 4 wheels      Prior Function Level of Independence: Needs assistance   Gait / Transfers Assistance Needed: Reports needing assist with transfers; propels w/c at home. Has not walked in 4 months  ADL's / Homemaking Assistance Needed: Wife assist for all ADLs. Past few days, wife providing bed baths.         Hand Dominance        Extremity/Trunk Assessment   Upper Extremity Assessment Upper Extremity Assessment: Defer to OT evaluation    Lower Extremity Assessment Lower  Extremity Assessment: LLE deficits/detail;RLE deficits/detail RLE Sensation: decreased light touch(foot) LLE Deficits / Details: left residual limb with some bleeding. Lacking full knee extension. LLE Sensation: decreased light touch LLE Coordination: decreased fine motor;decreased gross motor    Cervical / Trunk Assessment Cervical / Trunk Assessment: Kyphotic  Communication   Communication: HOH  Cognition  Arousal/Alertness: Lethargic;Suspect due to medications Behavior During Therapy: [facility] for tasks assessed/performed Overall Cognitive Status: Impaired/Different from baseline                                 General Comments: "I am not hallucinating" as pt talking about his family being held hostage and he called the colonel at the [employer] to try to get them rescued. Thinks he is at [facility]. But then when asked again , A&Ox3.      General Comments General comments (skin integrity, edema, etc.): on 3L/min 02 which is baseline    Exercises Amputee Exercises Quad Sets: Left;AROM;5 reps;Supine Hip ABduction/ADduction: AAROM;Left;5 reps;Supine Knee Flexion: AROM;Left;5 reps;Seated Knee Extension: AROM;Left;5 reps;Supine   Assessment/Plan    PT Assessment Patient needs continued PT services  PT Problem List Decreased strength;Decreased range of motion;Decreased activity tolerance;Decreased balance;Decreased mobility;Decreased knowledge of use of DME;Decreased safety awareness;Decreased knowledge of precautions;Impaired sensation;Cardiopulmonary status limiting activity;Decreased cognition;Decreased skin integrity       PT Treatment Interventions DME instruction;Gait training;Functional mobility training;Therapeutic activities;Therapeutic exercise;Balance training;Patient/family education;Wheelchair mobility training;Cognitive remediation    PT Goals (Current goals can be found in the Care Plan section)  Acute Rehab PT Goals Patient Stated Goal: to go to rehab PT Goal Formulation: With patient Time For Goal Achievement: 04/28/19 Potential to Achieve Goals: Fair    Frequency Min 3X/week   Barriers to discharge        Co-evaluation               AM-PAC PT "6 Clicks" Mobility  Outcome Measure Help needed turning from your back to your side while in a flat bed without using bedrails?: A Little Help needed moving from lying on your back to sitting on the  side of a flat bed without using bedrails?: A Lot Help needed moving to and from a bed to a chair (including a wheelchair)?: A Lot Help needed standing up from a chair using your arms (e.g., wheelchair or bedside chair)?: Total Help needed to walk in hospital room?: Total Help needed climbing 3-5 steps with a railing? : Total 6 Click Score: 10    End of Session   Activity Tolerance: Patient limited by lethargy Patient left: in bed;with call bell/phone within reach;with bed alarm set Nurse Communication: Mobility status PT Visit Diagnosis: Pain;Muscle weakness (generalized) (M62.81) Pain - Right/Left: Left Pain - part of body: Leg    Time: 7026-3785 PT Time Calculation (min) (ACUTE ONLY): 27 min   Charges:   PT Evaluation $PT Eval Moderate Complexity: 1 Mod PT Treatments $Therapeutic Activity: 8-22 mins        Wray Kearns, PT, DPT Acute Rehabilitation Services Pager [phone] Office [place] 04/14/2019, 2:36 PM

## 2019-04-14 NOTE — Transfer of Care (Signed)
Immediate Anesthesia Transfer of Care Note  Patient: Jeffrey [name]  Procedure(s) Performed: AMPUTATION BELOW KNEE (Left )  Patient Location: PACU  Anesthesia Type:General  Level of Consciousness: drowsy  Airway & Oxygen Therapy: Patient Spontanous Breathing and Patient connected to nasal cannula oxygen  Post-op Assessment: Report given to RN  Post vital signs: Reviewed and stable  Last Vitals:  Vitals Value Taken Time  BP 96/47 04/14/2019  9:15 AM  Temp    Pulse 61 04/14/2019  9:16 AM  Resp 12 04/14/2019  9:16 AM  SpO2 97 % 04/14/2019  9:16 AM  Vitals shown include unvalidated device data.  Last Pain:  Vitals:   04/14/19 0542  TempSrc: Oral  PainSc:          Complications: No apparent anesthesia complications

## 2019-04-14 NOTE — Anesthesia Preprocedure Evaluation (Signed)
Anesthesia Evaluation  Patient identified by MRN, date of birth, ID band Patient awake    Reviewed: Allergy & Precautions, NPO status , Patient's Chart, lab work & pertinent test results  Airway Mallampati: II  TM Distance: >3 FB Neck ROM: Full    Dental  (+) Edentulous Upper, Edentulous Lower   Pulmonary former smoker,    breath sounds clear to auscultation       Cardiovascular hypertension,  Rhythm:Regular Rate:Normal     Neuro/Psych    GI/Hepatic   Endo/Other  diabetes  Renal/GU      Musculoskeletal   Abdominal   Peds  Hematology   Anesthesia Other Findings   Reproductive/Obstetrics                             Anesthesia Physical Anesthesia Plan  ASA: III  Anesthesia Plan: General   Post-op Pain Management:    Induction: Intravenous  PONV Risk Score and Plan: Ondansetron  Airway Management Planned: Oral ETT  Additional Equipment:   Intra-op Plan:   Post-operative Plan: Extubation in OR  Informed Consent: I have reviewed the patients History and Physical, chart, labs and discussed the procedure including the risks, benefits and alternatives for the proposed anesthesia with the patient or authorized representative who has indicated his/her understanding and acceptance.       Plan Discussed with: CRNA and Anesthesiologist  Anesthesia Plan Comments:         Anesthesia Quick Evaluation

## 2019-04-14 NOTE — Progress Notes (Signed)
Pharmacy Antibiotic Note  Jeffrey [name] is a 78 y.o. male admitted on 04/10/2019 with L-foot wound now s/p BKA 5/19.  Pharmacy has been consulted for Vancomycin and Ceftazidime dosing.  The patient is ERSD and if currently off-schedule. Next HD planned for Wed, 5/20 - Vancomycin dose adjusted. Ceftazidime dose remains appropriate. Now that the patient is s/p BKA - consider d/cing antibiotics 24h post-op.   Plan: - Vancomycin 1g x 1 dose post HD on Wed, 5/20 - Continue Ceftazidime 1g IV every 24 hours - Consider d/cing antibiotics after 24 hours post-op coverage - Will continue to follow HD schedule/duration, culture results, LOT, and antibiotic de-escalation plans   Height: 5\' 11"  (180.3 cm) Weight: 216 lb 0.8 oz (98 kg) IBW/kg (Calculated) : 75.3  Temp (24hrs), Avg:97.9 F (36.6 C), Min:97.4 F (36.3 C), Max:98.3 F (36.8 C)  Recent Labs  Lab 04/10/19 1305 04/10/19 1400 04/10/19 1430 04/10/19 1534 04/11/19 0514 04/11/19 2358 04/13/19 0332 04/14/19 0000  WBC  --  10.7*  --   --  10.4 10.9* 9.7 10.9*  CREATININE 3.72*  --   --   --  4.47* 2.69* 4.08* 2.97*  LATICACIDVEN  --   --  2.1* 2.2*  --   --   --   --     Estimated Creatinine Clearance: 23.7 mL/min (A) (by C-G formula based on SCr of 2.97 mg/dL (H)).    Allergies  Allergen Reactions  . Byetta 10 Mcg Pen [Exenatide] Diarrhea and Nausea And Vomiting  . Codeine Itching  . Coumadin [Warfarin Sodium] Rash    (wife states coumadin was stopped but rash did not disappear 02/21/19)    Antimicrobials this admission: Vancomycin 5/15 >> Ceftazidime 5/15  >>  Microbiology results: 5/15 Bcx: ngtd 5/15 COVID: negative  5/15 MRSA PCR negative  Thank you for allowing pharmacy to be a part of this patient's care.  Alycia Rossetti, PharmD, BCPS Clinical Pharmacist Clinical phone for 04/14/2019: [phone] 04/14/2019 1:30 PM   **Pharmacist phone directory can now be found on [email or web address] ([email or web address]).  Listed under [place].

## 2019-04-14 NOTE — Progress Notes (Signed)
[place] for heparin Indication: atrial fibrillation  Allergies  Allergen Reactions  . Byetta 10 Mcg Pen [Exenatide] Diarrhea and Nausea And Vomiting  . Codeine Itching  . Coumadin [Warfarin Sodium] Rash    (wife states coumadin was stopped but rash did not disappear 02/21/19)   Patient Measurements: Height: 5\' 11"  (180.3 cm) Weight: 218 lb 11.1 oz (99.2 kg) IBW/kg (Calculated) : 75.3 Heparin Dosing Weight: 96.2 kg  Vital Signs: Temp: 97.9 F (36.6 C) (05/18 2045) Temp Source: Oral (05/18 2045) BP: 93/44 (05/18 2045) Pulse Rate: 54 (05/18 2045)  Labs: Recent Labs    04/11/19 0514  04/11/19 2358  04/12/19 2003 04/13/19 0332 04/13/19 1519 04/13/19 2352 04/14/19 0000  HGB 11.2*  --  10.5*  --   --  11.3*  --   --  10.8*  HCT 35.9*  --  32.9*  --   --  36.3*  --   --  34.4*  PLT 200  --  195  --   --  231  --   --  201  APTT  --    < > >200*   < > 92* 135* 81*  --   --   LABPROT  --   --   --   --   --   --   --  19.8*  --   INR  --   --   --   --   --   --   --  1.7*  --   HEPARINUNFRC  --    < > 1.90*  --   --  0.84* 0.52 0.45  --   CREATININE 4.47*  --  2.69*  --   --  4.08*  --   --   --   TROPONINI 0.61*  --   --   --   --   --   --   --   --    < > = values in this interval not displayed.   Estimated Creatinine Clearance: 17.3 mL/min (A) (by C-G formula based on SCr of 4.08 mg/dL (H)).  Assessment: 50 yoM admitted 5/15 for left foot wound. PMH afib on Eliquis PTA (last dose 5/15 AM), currently on hold for surgery. Pharmacy consulted to dose IV heparin.   Heparin level therapeutic at 0.45  Heparin drip to be held at 0500 on 5/19 for surgery. Stop date in.   Goal of Therapy:  Heparin level 0.3-0.7 units/ml aPTT 66-102 seconds Monitor platelets by anticoagulation protocol: Yes   Plan: Continue heparin gtt at 1400 units/hr F/u s/p surgery  Bertis Ruddy, PharmD Clinical Pharmacist Please check AMION for all [place] numbers 04/14/2019 12:28 AM

## 2019-04-14 NOTE — Progress Notes (Signed)
[place] for heparin Indication: atrial fibrillation  Allergies  Allergen Reactions  . Byetta 10 Mcg Pen [Exenatide] Diarrhea and Nausea And Vomiting  . Codeine Itching  . Coumadin [Warfarin Sodium] Rash    (wife states coumadin was stopped but rash did not disappear 02/21/19)   Patient Measurements: Height: 5\' 11"  (180.3 cm) Weight: 216 lb 0.8 oz (98 kg) IBW/kg (Calculated) : 75.3 Heparin Dosing Weight: 96.2 kg  Vital Signs: Temp: 97.7 F (36.5 C) (05/19 1100) Temp Source: Oral (05/19 1100) BP: 109/56 (05/19 1100) Pulse Rate: 65 (05/19 1100)  Labs: Recent Labs    04/11/19 2358  04/12/19 2003 04/13/19 0332 04/13/19 1519 04/13/19 2352 04/14/19 0000  HGB 10.5*  --   --  11.3*  --   --  10.8*  HCT 32.9*  --   --  36.3*  --   --  34.4*  PLT 195  --   --  231  --   --  201  APTT >200*   < > 92* 135* 81*  --   --   LABPROT  --   --   --   --   --  19.8*  --   INR  --   --   --   --   --  1.7*  --   HEPARINUNFRC 1.90*  --   --  0.84* 0.52 0.45  --   CREATININE 2.69*  --   --  4.08*  --   --  2.97*   < > = values in this interval not displayed.   Estimated Creatinine Clearance: 23.7 mL/min (A) (by C-G formula based on SCr of 2.97 mg/dL (H)).  Assessment: 71 yoM admitted 5/15 for left foot wound. PMH afib on Eliquis PTA (last dose 5/15 AM), currently on hold for surgery. Pharmacy consulted to dose IV heparin.   The patient is now s/p BKA with plans to resume Heparin at 1700 today per MD request. Will resume at the previous rate without a bolus. Per discussion with RN - patient bleeding through dressing. RN will discuss with VVS if the start time should be pushed back.   Goal of Therapy:  Heparin level 0.3-0.7 units/ml aPTT 66-102 seconds Monitor platelets by anticoagulation protocol: Yes   Plan: - Resume Heparin at a rate of 1400 units/hr (14 ml/hr) today starting at 1700 per MD request - Will monitor bleeding at dressing site -  Will follow-up with aPTT/HL in 8 hours after restarting drip  Thank you for allowing pharmacy to be a part of this patient's care.  Alycia Rossetti, PharmD, BCPS Clinical Pharmacist Clinical phone for 04/14/2019: [phone] 04/14/2019 1:20 PM   **Pharmacist phone directory can now be found on [email or web address] ([email or web address]).  Listed under [place].

## 2019-04-14 NOTE — Anesthesia Procedure Notes (Signed)
Procedure Name: Intubation Performed by: Milford Cage, CRNA Pre-anesthesia Checklist: Patient identified, Emergency Drugs available, Suction available and Patient being monitored Patient Re-evaluated:Patient Re-evaluated prior to induction Oxygen Delivery Method: Circle System Utilized Preoxygenation: Pre-oxygenation with 100% oxygen Induction Type: IV induction and Rapid sequence Laryngoscope Size: Mac and 3 Grade View: Grade I Tube type: Oral Tube size: 7.0 mm Number of attempts: 1 Airway Equipment and Method: Stylet and Oral airway Placement Confirmation: ETT inserted through vocal cords under direct vision,  positive ETCO2 and breath sounds checked- equal and bilateral Secured at: 22 cm Tube secured with: Tape Dental Injury: Teeth and Oropharynx as per pre-operative assessment

## 2019-04-14 NOTE — Anesthesia Postprocedure Evaluation (Signed)
Anesthesia Post Note  Patient: Jeffrey [name]  Procedure(s) Performed: AMPUTATION BELOW KNEE (Left )     Patient location during evaluation: PACU Anesthesia Type: General Level of consciousness: awake and alert Pain management: pain level controlled Vital Signs Assessment: post-procedure vital signs reviewed and stable Respiratory status: spontaneous breathing, nonlabored ventilation, respiratory function stable and patient connected to nasal cannula oxygen Cardiovascular status: blood pressure returned to baseline and stable Postop Assessment: no apparent nausea or vomiting Anesthetic complications: no    Last Vitals:  Vitals:   04/14/19 1045 04/14/19 1100  BP:  (!) 109/56  Pulse: 66 65  Resp: 16 16  Temp: 36.7 C 36.5 C  SpO2: 98% 100%    Last Pain:  Vitals:   04/14/19 1100  TempSrc: Oral  PainSc:                  Jo Booze COKER

## 2019-04-14 NOTE — Progress Notes (Signed)
PROGRESS NOTE    [name]  JAS:[id number] DOB: 1941/11/08 DOA: 04/10/2019 PCP: Scot Jun, FNP    Brief Narrative:   Jeffrey [name] is a 78 y.o. male with medical history significant of CHF, s/p pacemaker placement, s/p TAVR, A. fib on apixaban, CAD s/p stenting, ESRD on HD, DM type 2, PVD, and anemia; who presents with complaints of worsening left foot pain.  Admitted in March for cellulitis of the same foot, but reports that has not been getting better.  At this time he reports that he is wheelchair-bound and his son and wife help him with transfers at home.  He had an abdominal aortogram along with the lower extremities performed by Dr. Scot Dock on 5/1.  Based off of that study he was thought to have reasonable flow to the left foot with real good options for revascularization.  At that time he felt that his toes were similar to now with dark appearance of the second, third, and fourth toe.  He was advised to wash foot daily and keep it clean which he has done.  However, over the last 14 days he has developed progressively worsening pain that he describes as sharp and shooting.  He has been trying to take Tylenol without any relief of symptoms.  Denies having any significant fever, chills, abdominal pain, nausea, vomiting, or diarrhea.  He last had hemodialysis yesterday.  ED Course: On admission to the emergency department patient was noted to be afebrile, pulse 51-60, respirations 14-23, blood pressure 93/48-147/129, and O2 saturations maintained on 2 L nasal cannula oxygen.  Labs revealed WBC 10.7, hemoglobin 11.3, sodium 132, potassium 5, BUN 26, creatinine 3.72, glucose 222, lactic acid 2.1 ->2.2, and troponin  0.63.  X-rays revealed erosive osseous changes of the fourth digit left foot concerning for osteomyelitis.  Blood cultures were obtained.  Patient was given empiric antibiotics of vancomycin and Fortaz.   Assessment & Plan:   Active Problems:   Diabetes type  2, controlled ([place])   ESRD (end stage renal disease) on dialysis (HCC)   Anemia of chronic disease   Automatic implantable cardioverter-defibrillator in situ   Chronic respiratory failure with hypoxia (HCC)   Troponin level elevated   Osteomyelitis (HCC)   Sepsis, present on admission Left foot osteomyelitis with gangrene Patient presenting with progressive left foot pain with associated skin changes.  Noted to have elevated WBC count of 10.7, respirations 23, lactic acid 2.1 on admission.  CRP 10.1. Afebrile.  X-ray left foot notable for progressive osseous erosions concerning for osteomyelitis at the fourth distal phalanx. 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 Scot Dock --continue empiric antibiotics with vancomycin and Tressie Ellis for another 24h following surgery, then can likely discontinue as we have source control now following amputation, pharmacy for assistance in dosing/monitoring --Pain control with Norco and IV morphine as needed --PT/OT evaluation following surgical intervention for discharge needs --Continue supportive care  Permanent atrial fibrillation History of PPM placement.  On Eliquis and metoprolol outpatient. --Hold Eliquis in anticipation of surgical intervention --heparin drip in the perioperative setting given elevated CHADS-VASc score of 6, pharmacy consulted --Continue rate control with metoprolol --Continue to monitor on telemetry  ESRD on HD [facility] in [place] on a Tuesday, Thursday, Saturday schedule. --Nephrology consulted for assistance with continued hemodialysis while inpatient  Type 2 diabetes mellitus Patient's home regimen includes Levemir 28 units nightly.  Patient states insulin dose recently reduced  by PCP.  Previous hemoglobin A1c 6.3 on 01/07/2019. --Hemoglobin A1c up to 8.4, poorly controlled --Levemir at reduced dose of 18 units daily --CBGs TIDAC --moderate SSI --Hypoglycemic protocol   Peripheral vascular disease --Plavix currently held due to possible need of surgical procedure  Hypotension: Chronic  Patient notes systolic blood pressures normally will be in the 80s.  Maps currently greater than 65. --Continue midodrine  Anemia of chronic disease:  Hemoglobin 11.3 g/dL.  Patient does not report any acute bleeding. --Follow CBC  COPD, without exacerbation chronic respiratory failure with hypoxia --Continue home 2 L of nasal cannula oxygen --Pharmacy substitution of Dulera for Symbicort --Albuterol nebs as needed for shortness of breath/wheezing  Aortic stenosis s/p TAVR: Stable  Anxiety --Continue Xanax as needed anxiety   DVT prophylaxis: SCDs, heparin drip Code Status: Full code Family Communication: None Disposition Plan: PT/OT eval for d/c needs following BKA today, likley will need HH vs SNF on discharge   Consultants:   Vascular surgery  Procedures:   None  Antimicrobials:   Vancomycin 5/15>>  Tressie Ellis 5/15>>   Subjective:  Patient seen following return from PACU.  Underwent BKA today of his left lower extremity.  Reports pain and requests pain medication.  Appears slightly confused/groggy.  No other complaints at this time. Denies headache, no fever/chills/night sweats, no chest pain, no palpitations, no abdominal pain, no issues with bowel/bladder function.  No acute events overnight per nursing staff.  Objective: Vitals:   04/14/19 1019 04/14/19 1034 04/14/19 1045 04/14/19 1100  BP: (!) 91/48 (!) 106/49  (!) 109/56  Pulse:   66 65  Resp:   16 16  Temp:   98 F (36.7 C) 97.7 F (36.5 C)  TempSrc:    Oral  SpO2:   98% 100%  Weight:      Height:        Intake/Output Summary (Last 24 hours) at 04/14/2019 1152 Last data filed at 04/14/2019 1000 Gross per 24 hour  Intake 809.71 ml  Output 500 ml  Net 309.71 ml   Filed Weights   04/13/19 0735 04/13/19 1143 04/13/19 2045  Weight: 102.6 kg 99.2 kg 98 kg    Examination:   General exam: Appears calm and comfortable  Respiratory system: Clear to auscultation. Respiratory effort normal.  On 2 L nasal cannula which is his home dose Cardiovascular system: S1 & S2 heard, RRR. No JVD, murmurs, rubs, gallops or clicks. No pedal edema. Gastrointestinal system: Abdomen is nondistended, soft and nontender. No organomegaly or masses felt. Normal bowel sounds heard. Central nervous system: Alert and oriented. No focal neurological deficits. Extremities: Symmetric 5 x 5 power. Skin: Left lower extremity BKA noted, dressing/ace wrap in place with some bloody drainage noted on bandage. Psychiatry: Judgement and insight appear normal. Mood & affect appropriate.     Data Reviewed: I have personally reviewed following labs and imaging studies  CBC: Recent Labs  Lab 04/10/19 1400 04/11/19 0514 04/11/19 2358 04/13/19 0332 04/14/19 0000  WBC 10.7* 10.4 10.9* 9.7 10.9*  NEUTROABS 7.9*  --   --   --   --   HGB 11.3* 11.2* 10.5* 11.3* 10.8*  HCT 36.2* 35.9* 32.9* 36.3* 34.4*  MCV 91.6 91.1 90.6 91.7 92.0  PLT 215 200 195 231 341   Basic Metabolic Panel: Recent Labs  Lab 04/10/19 1305 04/11/19 0514 04/11/19 2358 04/13/19 0332 04/14/19 0000  NA 132* 132* 134* 129* 136  K 5.0 5.1 4.1 5.3* 4.2  CL 91* 89* 93* 89* 95*  CO2 24 26 32 25 28  GLUCOSE 222* 191* 114* 114* 124*  BUN 26* 33* 14 26* 17  CREATININE 3.72* 4.47* 2.69* 4.08* 2.97*  CALCIUM 9.0 9.2 8.7* 9.3 9.6   GFR: Estimated Creatinine Clearance: 23.7 mL/min (A) (by C-G formula based on SCr of 2.97 mg/dL (H)). Liver Function Tests: Recent Labs  Lab 04/10/19 1305  AST 26  ALT 18  ALKPHOS 134*  BILITOT 1.8*  PROT 6.5  ALBUMIN 2.9*   No results for input(s): LIPASE, AMYLASE in the last 168 hours. No results for input(s): AMMONIA in the last 168 hours. Coagulation Profile: Recent Labs  Lab 04/13/19 2352  INR 1.7*   Cardiac Enzymes: Recent Labs  Lab 04/10/19 1305 04/11/19 0514  TROPONINI  0.63* 0.61*   BNP (last 3 results) No results for input(s): PROBNP in the last 8760 hours. HbA1C: No results for input(s): HGBA1C in the last 72 hours. CBG: Recent Labs  Lab 04/13/19 1617 04/13/19 2143 04/14/19 0542 04/14/19 0917 04/14/19 1134  GLUCAP 101* 106* 102* 109* 112*   Lipid Profile: No results for input(s): CHOL, HDL, LDLCALC, TRIG, CHOLHDL, LDLDIRECT in the last 72 hours. Thyroid Function Tests: No results for input(s): TSH, T4TOTAL, FREET4, T3FREE, THYROIDAB in the last 72 hours. Anemia Panel: No results for input(s): VITAMINB12, FOLATE, FERRITIN, TIBC, IRON, RETICCTPCT in the last 72 hours. Sepsis Labs: Recent Labs  Lab 04/10/19 1430 04/10/19 1534  LATICACIDVEN 2.1* 2.2*    Recent Results (from the past 240 hour(s))  SARS Coronavirus 2 (CEPHEID - Performed in [place] hospital lab), Hosp Order     Status: None   Collection Time: 04/10/19 12:56 PM  Result Value Ref Range Status   SARS Coronavirus 2 NEGATIVE NEGATIVE Final    Comment: (NOTE) If result is NEGATIVE SARS-CoV-2 target nucleic acids are NOT DETECTED. The SARS-CoV-2 RNA is generally detectable in upper and lower  respiratory specimens during the acute phase of infection. The lowest  concentration of SARS-CoV-2 viral copies this assay can detect is 250  copies / mL. A negative result does not preclude SARS-CoV-2 infection  and should not be used as the sole basis for treatment or other  patient management decisions.  A negative result may occur with  improper specimen collection / handling, submission of specimen other  than nasopharyngeal swab, presence of viral mutation(s) within the  areas targeted by this assay, and inadequate number of viral copies  (<250 copies / mL). A negative result must be combined with clinical  observations, patient history, and epidemiological information. If result is POSITIVE SARS-CoV-2 target nucleic acids are DETECTED. The SARS-CoV-2 RNA is generally  detectable in upper and lower  respiratory specimens dur ing the acute phase of infection.  Positive  results are indicative of active infection with SARS-CoV-2.  Clinical  correlation with patient history and other diagnostic information is  necessary to determine patient infection status.  Positive results do  not rule out bacterial infection or co-infection with other viruses. If result is PRESUMPTIVE POSTIVE SARS-CoV-2 nucleic acids MAY BE PRESENT.   A presumptive positive result was obtained on the submitted specimen  and confirmed on repeat testing.  While 2019 novel coronavirus  (SARS-CoV-2) nucleic acids may be present in the submitted sample  additional confirmatory testing may be necessary for epidemiological  and / or clinical management purposes  to differentiate between  SARS-CoV-2 and other Sarbecovirus currently known to infect humans.  If clinically indicated additional testing with an alternate test  methodology [id number])  is advised. The SARS-CoV-2 RNA is generally  detectable in upper and lower respiratory sp ecimens during the acute  phase of infection. The expected result is Negative. Fact Sheet for Patients:  StrictlyIdeas.no Fact Sheet for Healthcare Providers: BankingDealers.co.za This test is not yet approved or cleared by the [place] FDA and has been authorized for detection and/or diagnosis of SARS-CoV-2 by FDA under an Emergency Use Authorization (EUA).  This EUA will remain in effect (meaning this test can be used) for the duration of the COVID-19 declaration under Section 564(b)(1) of the Act, 21 U.S.C. section 360bbb-3(b)(1), unless the authorization is terminated or revoked sooner. Performed at [facility], [place] [address] [place], [place] [address]   Culture, blood (routine x 2)     Status: None (Preliminary result)   Collection Time: 04/10/19  2:04 PM  Result Value Ref Range Status    Specimen Description BLOOD RIGHT ANTECUBITAL  Final   Special Requests   Final    AEROBIC BOTTLE ONLY Blood Culture results may not be optimal due to an excessive volume of blood received in culture bottles   Culture   Final    NO GROWTH 4 DAYS Performed at [facility], [place] [address], [place], [place] [address]    Report Status PENDING  Incomplete  MRSA PCR Screening     Status: None   Collection Time: 04/10/19  5:17 PM  Result Value Ref Range Status   MRSA by PCR NEGATIVE NEGATIVE Final    Comment:        The GeneXpert MRSA Assay (FDA approved for NASAL specimens only), is one component of a comprehensive MRSA colonization surveillance program. It is not intended to diagnose MRSA infection nor to guide or monitor treatment for MRSA infections. Performed at [facility], [place] [address], [place], [place] [address]   Culture, blood (routine x 2)     Status: None (Preliminary result)   Collection Time: 04/10/19  5:54 PM  Result Value Ref Range Status   Specimen Description BLOOD RIGHT HAND  Final   Special Requests   Final    BOTTLES DRAWN AEROBIC AND ANAEROBIC Blood Culture adequate volume   Culture   Final    NO GROWTH 4 DAYS Performed at [facility], [place] [address], [place], [place] [address]    Report Status PENDING  Incomplete  Surgical pcr screen     Status: None   Collection Time: 04/14/19  5:46 AM  Result Value Ref Range Status   MRSA, PCR NEGATIVE NEGATIVE Final   Staphylococcus aureus NEGATIVE NEGATIVE Final    Comment: (NOTE) The Xpert SA Assay (FDA approved for NASAL specimens in patients 35 years of age and older), is one component of a comprehensive surveillance program. It is not intended to diagnose infection nor to guide or monitor treatment. Performed at [facility], [place] [address], [place], [place] [address]          Radiology Studies: No results found.      Scheduled Meds: . atorvastatin  80 mg Oral QPM  .  Chlorhexidine Gluconate Cloth  6 each Topical Q0600  . Chlorhexidine Gluconate Cloth  6 each Topical Q0600  . cinacalcet  30 mg Oral Q T,Th,Sat-1800  . doxercalciferol  1 mcg Intravenous Q T,Th,Sa-HD  . fentaNYL      . insulin aspart  0-15 Units Subcutaneous TID WC  . insulin aspart  0-5 Units Subcutaneous QHS  . insulin  detemir  18 Units Subcutaneous Daily  . lidocaine-prilocaine  1 application Topical See admin instructions  . metoprolol tartrate  12.5 mg Oral 2 times per day on Sun Mon Wed Fri  . midodrine  10 mg Oral Once per day on Mon Tue Thu Sat  . midodrine  2.5 mg Oral TID WC  . mometasone-formoterol  2 puff Inhalation BID  . pantoprazole  40 mg Oral QPM  . sevelamer carbonate  2,400 mg Oral TID WC  . sodium chloride flush  3 mL Intravenous Q12H   Continuous Infusions: . cefTAZidime (FORTAZ)  IV 1 g (04/13/19 2224)  . vancomycin Stopped (04/11/19 2104)     LOS: 4 days    Time spent: 29 minutes    Barry Faircloth J [place] DO [facility] Pager [phone]  If 7PM-7AM, please contact night-coverage [email or web address] Password [facility] 04/14/2019, 11:52 AM

## 2019-04-14 NOTE — Progress Notes (Signed)
Called to evaluate blood on dressing  Ace wrap with blood staining on the medial and lateral sides. Dressing removed. WOund inspected.  NO active bleeding.  Ace wrap, kerlix, and ABD pad replaced. OK to start heparin back tonight as scheduled   Wells BRabham

## 2019-04-14 NOTE — Op Note (Signed)
    NAME: [name]    MRN: [id number] DOB: [birth year]    DATE OF OPERATION: 04/14/2019  PREOP DIAGNOSIS:    Nonhealing wound left foot with non-unreconstructable vascular disease  POSTOP DIAGNOSIS:    Same  PROCEDURE:    Left below the knee amputation  SURGEON: Judeth Cornfield. Scot Dock, MD, FACS  ASSIST: Arlee Muslim, PA  ANESTHESIA: General  EBL: 100 cc  INDICATIONS:    Jeffrey [name] is a 78 y.o. male who presented with a nonhealing wound of the left foot.  He had undergone an arteriogram and had no options for revascularization.  FINDINGS:   Good bleeding at the below the knee level.  TECHNIQUE:   The patient was taken to the operating room and received a general anesthetic.  The left leg was prepped and draped in the usual sterile fashion.  A tourniquet had been placed on the upper thigh.  The circumference in the limb was measured 10 cm distal to the tibial tuberosity and two thirds at this distance was used to mark the anterior skin flap.  The long posterior flap of equal length was marked.  The leg was exsanguinated with an Esmarch bandage and the tourniquet inflated to 300 mmHg.  Under tourniquet control, the incision was carried down to the skin subcutaneous tissue and muscle.  The tourniquet was not effective because of his severe calcific disease.  Therefore the dissection was done with electrocautery.  The dissection was carried down to the tibia and fibula which were dissected free circumferentially.  The periosteum was elevated and the bone divided proximal to the level of skin division.  The anterior aspect of the tibia was beveled.  The arteries and veins were individually suture ligated with 2-0 silk ties.  Additional hemostasis was obtained using electrocautery and 2-0 silk ties.  The wound was irrigated with saline.  The fascial layer was closed with interrupted 2-0 Vicryl's.  The skin was closed with staples.  Sterile dressing was applied.  The patient  tolerated the procedure well was transferred to the recovery room in stable condition.  All needle and sponge counts were correct.  [name], MD, FACS [facility]  DATE OF DICTATION:   04/14/2019

## 2019-04-14 NOTE — Interval H&P Note (Signed)
History and Physical Interval Note:  04/14/2019 7:29 AM  Jeffrey [name]  has presented today for surgery, with the diagnosis of nonviable tissue left leg.  The various methods of treatment have been discussed with the patient and family. After consideration of risks, benefits and other options for treatment, the patient has consented to  Procedure(s): AMPUTATION BELOW KNEE (Left) as a surgical intervention.  The patient's history has been reviewed, patient examined, no change in status, stable for surgery.  I have reviewed the patient's chart and labs.  Questions were answered to the patient's satisfaction.     [name]

## 2019-04-15 ENCOUNTER — Encounter (HOSPITAL_COMMUNITY): Payer: Self-pay | Admitting: Vascular Surgery

## 2019-04-15 DIAGNOSIS — R7989 Other specified abnormal findings of blood chemistry: Secondary | ICD-10-CM

## 2019-04-15 DIAGNOSIS — Z794 Long term (current) use of insulin: Secondary | ICD-10-CM

## 2019-04-15 DIAGNOSIS — M869 Osteomyelitis, unspecified: Secondary | ICD-10-CM

## 2019-04-15 DIAGNOSIS — Z992 Dependence on renal dialysis: Secondary | ICD-10-CM

## 2019-04-15 DIAGNOSIS — E118 Type 2 diabetes mellitus with unspecified complications: Secondary | ICD-10-CM

## 2019-04-15 DIAGNOSIS — D638 Anemia in other chronic diseases classified elsewhere: Secondary | ICD-10-CM

## 2019-04-15 DIAGNOSIS — N186 End stage renal disease: Secondary | ICD-10-CM

## 2019-04-15 DIAGNOSIS — S88119A Complete traumatic amputation at level between knee and ankle, unspecified lower leg, initial encounter: Secondary | ICD-10-CM

## 2019-04-15 DIAGNOSIS — Z95810 Presence of automatic (implantable) cardiac defibrillator: Secondary | ICD-10-CM

## 2019-04-15 DIAGNOSIS — J9611 Chronic respiratory failure with hypoxia: Secondary | ICD-10-CM

## 2019-04-15 LAB — HEPARIN LEVEL (UNFRACTIONATED)
Heparin Unfractionated: 0.34 IU/mL (ref 0.30–0.70)
Heparin Unfractionated: 0.41 IU/mL (ref 0.30–0.70)

## 2019-04-15 LAB — CBC
HCT: 33.2 % — ABNORMAL LOW (ref 39.0–52.0)
Hemoglobin: 10.4 g/dL — ABNORMAL LOW (ref 13.0–17.0)
MCH: 28.6 pg (ref 26.0–34.0)
MCHC: 31.3 g/dL (ref 30.0–36.0)
MCV: 91.2 fL (ref 80.0–100.0)
Platelets: 219 10*3/uL (ref 150–400)
RBC: 3.64 MIL/uL — ABNORMAL LOW (ref 4.22–5.81)
RDW: 16.6 % — ABNORMAL HIGH (ref 11.5–15.5)
WBC: 10.6 10*3/uL — ABNORMAL HIGH (ref 4.0–10.5)
nRBC: 0 % (ref 0.0–0.2)

## 2019-04-15 LAB — GLUCOSE, CAPILLARY
Glucose-Capillary: 163 mg/dL — ABNORMAL HIGH (ref 70–99)
Glucose-Capillary: 160 mg/dL — ABNORMAL HIGH (ref 70–99)
Glucose-Capillary: 146 mg/dL — ABNORMAL HIGH (ref 70–99)
Glucose-Capillary: 123 mg/dL — ABNORMAL HIGH (ref 70–99)

## 2019-04-15 LAB — BASIC METABOLIC PANEL
Anion gap: 19 — ABNORMAL HIGH (ref 5–15)
BUN: 36 mg/dL — ABNORMAL HIGH (ref 8–23)
CO2: 20 mmol/L — ABNORMAL LOW (ref 22–32)
Calcium: 10.3 mg/dL (ref 8.9–10.3)
Chloride: 94 mmol/L — ABNORMAL LOW (ref 98–111)
Creatinine, Ser: 4.2 mg/dL — ABNORMAL HIGH (ref 0.61–1.24)
GFR calc Af Amer: 14 mL/min — ABNORMAL LOW (ref >60)
GFR calc non Af Amer: 12 mL/min — ABNORMAL LOW (ref >60)
Glucose, Bld: 170 mg/dL — ABNORMAL HIGH (ref 70–99)
Potassium: 5.4 mmol/L — ABNORMAL HIGH (ref 3.5–5.1)
Sodium: 133 mmol/L — ABNORMAL LOW (ref 135–145)

## 2019-04-15 LAB — CULTURE, BLOOD (ROUTINE X 2)
Culture: NO GROWTH
Culture: NO GROWTH
Special Requests: ADEQUATE

## 2019-04-15 LAB — APTT
aPTT: 70 seconds — ABNORMAL HIGH (ref 24–36)
aPTT: 81 seconds — ABNORMAL HIGH (ref 24–36)

## 2019-04-15 MED ORDER — HEPARIN SODIUM (PORCINE) 1000 UNIT/ML DIALYSIS: 2000.0000 [IU] | INTRAMUSCULAR | Status: DC | PRN

## 2019-04-15 MED ORDER — APIXABAN 2.5 MG PO TABS
2.5000 mg | ORAL_TABLET | Freq: Two times a day (BID) | ORAL | Status: DC
  Administered 2019-04-15 – 2019-04-18 (×7): 2.5 mg via ORAL
  Filled 2019-04-15 (×7): qty 1

## 2019-04-15 MED ORDER — CHLORHEXIDINE GLUCONATE CLOTH 2 % EX PADS
6.0000 | MEDICATED_PAD | Freq: Every day | CUTANEOUS | Status: DC
  Administered 2019-04-15: 6 via TOPICAL

## 2019-04-15 NOTE — Progress Notes (Signed)
Physical Therapy Treatment Patient Details Name: Jeffrey [name] MRN: [id number] DOB: 07-Apr-1941 Today's Date: 04/15/2019    History of Present Illness Jeffrey [name] is a 78 y.o. male with history of CAD status post stenting, ESRD on hemodialysis Tuesday Thursday Saturday, diabetes mellitus type 2, anemia, CHF, pacemaker placement, status post TAVR, A. fib on apixaban presents to the ER with complaints of increasing swelling of the left lower extremity over the last 4 days with discoloration of the middle 3 toes of the left foot over the last 2 days.     PT Comments    Patient seen s/p L BKA, requiring max A for bed mobility and unable to stand with +2 assistance today. Worked on lateral scooting along head of bed, fatigues quickly. Updated recs to SNF given patients new presentation.     Follow Up Recommendations  SNF     Equipment Recommendations  None recommended by PT(pretty well-equipped)    Recommendations for Other Services       Precautions / Restrictions Precautions Precautions: Fall Restrictions Weight Bearing Restrictions: No    Mobility  Bed Mobility Overal bed mobility: Needs Assistance Bed Mobility: Rolling;Sidelying to Sit;Sit to Sidelying   Sidelying to sit: Max assist;HOB elevated     Sit to sidelying: Max assist;HOB elevated General bed mobility comments: max A of 2 to assist up, cues for using bed rails with BUE, support of trunk.   Transfers Overall transfer level: Needs assistance Equipment used: Rolling walker (2 wheeled) Transfers: Sit to/from Stand Sit to Stand: Mod assist         General transfer comment: defered lateral or squat pivot transfer to chair due to patients fatigue. patinet with lateral scooting on slight decline EOB for majority of session. Mod A needed towards end due to fatigue.   Ambulation/Gait                 Stairs             Wheelchair Mobility    Modified Rankin (Stroke Patients Only)        Balance Overall balance assessment: Needs assistance Sitting-balance support: Feet supported Sitting balance-Leahy Scale: Fair       Standing balance-Leahy Scale: Poor                              Cognition Arousal/Alertness: Awake/alert Behavior During Therapy: WFL for tasks assessed/performed Overall Cognitive Status: Within Functional Limits for tasks assessed                                 General Comments: for simple mobility      Exercises      General Comments        Pertinent Vitals/Pain Pain Assessment: Faces Faces Pain Scale: Hurts little more Pain Location: left residual limb    Home Living                      Prior Function            PT Goals (current goals can now be found in the care plan section) Acute Rehab PT Goals Patient Stated Goal: Hopes to be home soon; wants to be able t sleep better PT Goal Formulation: With patient Time For Goal Achievement: 03/10/19 Potential to Achieve Goals: Good    Frequency    Min 3X/week  PT Plan      Co-evaluation PT/OT/SLP Co-Evaluation/Treatment: Yes Reason for Co-Treatment: To address functional/ADL transfers PT goals addressed during session: Mobility/safety with mobility;Balance;Proper use of DME;Strengthening/ROM        AM-PAC PT "6 Clicks" Mobility   Outcome Measure  Help needed turning from your back to your side while in a flat bed without using bedrails?: None Help needed moving from lying on your back to sitting on the side of a flat bed without using bedrails?: A Little Help needed moving to and from a bed to a chair (including a wheelchair)?: A Little Help needed standing up from a chair using your arms (e.g., wheelchair or bedside chair)?: A Little Help needed to walk in hospital room?: A Little Help needed climbing 3-5 steps with a railing? : A Lot 6 Click Score: 18    End of Session Equipment Utilized During Treatment: Gait  belt Activity Tolerance: Patient limited by fatigue Patient left: in chair;with call bell/phone within reach;with chair alarm set Nurse Communication: Mobility status PT Visit Diagnosis: Unsteadiness on feet (R26.81);Other abnormalities of gait and mobility (R26.89)     Time: 2248-2500 PT Time Calculation (min) (ACUTE ONLY): 25 min  Charges:  $Therapeutic Activity: 8-22 mins                    Reinaldo Berber, PT, DPT Acute Rehabilitation Services Pager: [phone] Office: [phone]     Reinaldo Berber 04/15/2019, 11:51 AM

## 2019-04-15 NOTE — Progress Notes (Addendum)
[facility] [place]  Subjective  - Patient states he is comfortable and the his pain is currently controlled.   Objective (!) 116/58 62 98.4 F (36.9 C) (Oral) 18 100%  Intake/Output Summary (Last 24 hours) at 04/15/2019 0832 Last data filed at 04/15/2019 0500 Gross per 24 hour  Intake 1167.05 ml  Output 500 ml  Net 667.05 ml   Left LE dressing clean and dry Thigh above dressing is warm   Assessment/Planning: POD # 1 left BKA  Dressing clean and dry and pain level controlled with PO medications. Plan to change dressing tomorrow.  Jeffrey [name] 04/15/2019 8:32 AM --  Laboratory Lab Results: Recent Labs    04/14/19 0000 04/15/19 0418  WBC 10.9* 10.6*  HGB 10.8* 10.4*  HCT 34.4* 33.2*  PLT 201 219   BMET Recent Labs    04/14/19 0000 04/15/19 0418  NA 136 133*  K 4.2 5.4*  CL 95* 94*  CO2 28 20*  GLUCOSE 124* 170*  BUN 17 36*  CREATININE 2.97* 4.20*  CALCIUM 9.6 10.3    COAG Lab Results  Component Value Date   INR 1.7 (H) 04/13/2019   INR 2.4 (H) 02/20/2019   INR 2.5 12/19/2017   No results found for: PTT  I have independently interviewed and examined patient and agree with PA assessment and plan above. Dressing down tomorrow. Restart eliquis for afib.  Cledith Abdou C. Donzetta Matters, MD  [facility] [place] Office: [phone] Pager: [phone]

## 2019-04-15 NOTE — Progress Notes (Signed)
ANTICOAGULATION CONSULT NOTE - Follow Up Consult  Pharmacy Consult for heparin Indication: atrial fibrillation  Labs: Recent Labs    04/13/19 0332 04/13/19 1519 04/13/19 2352 04/14/19 0000 04/15/19 0418  HGB 11.3*  --   --  10.8* 10.4*  HCT 36.3*  --   --  34.4* 33.2*  PLT 231  --   --  201 219  APTT 135* 81*  --   --  70*  LABPROT  --   --  19.8*  --   --   INR  --   --  1.7*  --   --   HEPARINUNFRC 0.84* 0.52 0.45  --  0.34  CREATININE 4.08*  --   --  2.97*  --     Assessment/Plan:  78yo male therapeutic on heparin after resumed. Will continue gtt at current rate and confirm stable with additional level.   Wynona Neat, PharmD, BCPS  04/15/2019,5:10 AM

## 2019-04-15 NOTE — Progress Notes (Signed)
PROGRESS NOTE  Jeffrey [name] MVH:[id number] DOB: 08-May-1941 DOA: 04/10/2019 PCP: Scot Jun, FNP   LOS: 5 days   Brief narrative:  Jeffrey [name] a 78 y.o.malewith medical history significant ofCHF,s/ppacemaker placement, s/pTAVR, A. fib on apixaban, CADs/pstent, ESRD onHD,DMtype 2, PVD,andanemia; who presented to the hospital with complaints of worsening left foot pain.Admitted in March for cellulitis of the same foot, but reported that has not been getting better. At this time, he reports that he is wheelchair-bound and his son and wife help him with transfers at home. He hadan abdominal aortogram along with thelower extremities performed by Dr. Scot Dock on 5/1. Based off of that study, he was thought to have reasonable flow to the left foot with real good options for revascularization. At that time, he felt that his toes were similarto nowwith dark appearance of the second,third,and fourth toe. He was advised to wash foot daily and keep it clean which he has done. However, over the  14 days prior to presentation, he developed progressively worsening pain.  On admission to the emergency department, patient was noted to be afebrile, pulse 51-60, respirations14-23, blood pressure 93/48-147/129, and O2 saturations maintained on 2 L nasal cannula oxygen. Labs revealed WBC 10.7, hemoglobin 11.3, sodium 132, potassium 5, BUN 26, creatinine 3.72, glucose 222, lactic acid 2.1 ->2.2,and troponin 0.63. X-rays revealed erosive osseous changes of the fourth digit left foot concerning for osteomyelitis. Blood cultures were obtained. Patient was given empiric antibiotics of vancomycin and Fortaz.  Subjective: Patient feels much better with the pain.  Status post left below-knee amputation.  Denies any nausea vomiting or diarrhea.  Denies cough, shortness of breath or chest pain.  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 Scot Dock.  Patient was on vancomycin and Fortaz.  Will discontinue antibiotics in the absence of bacteremia and source of infection has been controlled.  Continue physical therapy, occupational therapy evaluation.  He is afebrile at this time.  WBC is 10.6 today.  Blood cultures are negative in 4 days.  Permanent atrial fibrillation status post PPM.  On Eliquis and metoprolol as outpatient. History of PPM placement.  Currently on heparin drip.  Will resume anticoagulation if it is okay with vascular surgery.  ESRD on HD Continue hemodialysis Tuesday, Thursday, Saturday schedule. Nephrology on board.   Type 2 diabetes mellitus Patient's home regimen includes Levemir 28 units nightly.  Patient states insulin dose recently reduced by PCP.  Hemoglobin A1c 6.3 on 01/07/2019.  On reduced dose of Levemir here in the hospital with sliding scale insulin.  Hemoglobin A1c up to 8.4 this time.  Peripheral vascular disease Plavix currently on hold.  Will resume it was okay with vascular surgery.  Chronic hypotension. Continue Midrin.  Anemia of chronic disease:  Hemoglobin of 10.4.  No evidence of bleeding.  COPD, without exacerbation, chronic respiratory failure with hypoxia On nasal cannula oxygen at 2 L/min.  Continue nebulizers.  Aortic stenosiss/p TAVR:Stable  Anxiety --Continue Xanax as needed anxiety  VTE Prophylaxis: Heparin drip  Code Status: Full code  Family Communication: None  Disposition Plan: Skilled nursing facility, PT OT evaluation   Consultants:  Nephrology, vascular  surgery   Procedures: Left BKA 5/19 by Dr. Scot Dock.   Antibiotics:  Anti-infectives (From admission, onward)   Start     Dose/Rate Route Frequency Ordered Stop   04/15/19 1200  vancomycin (VANCOCIN) IVPB 1000 mg/200 mL premix     1,000 mg 200 mL/hr over 60 Minutes Intravenous Every Wed (Hemodialysis) 04/14/19 1317 04/22/19 1159   04/14/19 1317  vancomycin variable dose per unstable renal function (pharmacist dosing)      Does not apply See admin instructions 04/14/19 1317     04/14/19 0000  ceFAZolin (ANCEF) IVPB 1 g/50 mL premix  Status:  Discontinued    Note to Pharmacy:  Send with pt to OR   1 g 100 mL/hr over 30 Minutes Intravenous On call 04/13/19 0758 04/13/19 0758   04/13/19 1200  vancomycin (VANCOCIN) IVPB 1000 mg/200 mL premix     1,000 mg 200 mL/hr over 60 Minutes Intravenous Every Mon (Hemodialysis) 04/12/19 1232 04/13/19 1233   04/11/19 2000  vancomycin (VANCOCIN) IVPB 1000 mg/200 mL premix  Status:  Discontinued     1,000 mg 200 mL/hr over 60 Minutes Intravenous Every T-Th-Sa (Hemodialysis) 04/10/19 1449 04/14/19 1317   04/11/19 2000  cefTAZidime (FORTAZ) 1 g in sodium chloride 0.9 % 100 mL IVPB     1 g 200 mL/hr over 30 Minutes Intravenous Every 24 hours 04/10/19 1621     04/10/19 1500  vancomycin (VANCOCIN) 2,000 mg in sodium chloride 0.9 % 500 mL IVPB     2,000 mg 250 mL/hr over 120 Minutes Intravenous  Once 04/10/19 1449 04/10/19 1733   04/10/19 1445  cefTAZidime (FORTAZ) 1 g in sodium chloride 0.9 % 100 mL IVPB     1 g 200 mL/hr over 30 Minutes Intravenous  Once 04/10/19 1431 04/10/19 1511      Objective: Vitals:   04/15/19 0841 04/15/19 0940  BP:  (!) 83/41  Pulse:  60  Resp:  18  Temp:  (!) 97.5 F (36.4 C)  SpO2: 100% 99%    Intake/Output Summary (Last 24 hours) at 04/15/2019 1228 Last data filed at 04/15/2019 0900 Gross per 24 hour  Intake 1067.05 ml  Output 0 ml  Net 1067.05 ml   Filed Weights   04/13/19 1143 04/13/19 2045 04/14/19 2048  Weight: 99.2 kg 98 kg 97.6 kg   Body mass index is 30.01 kg/m.    Physical Exam: GENERAL: Patient is alert awake and oriented. Not in obvious distress. HENT: No scleral pallor or icterus. Pupils equally reactive to light. Oral mucosa is moist NECK: is supple, no palpable thyroid enlargement. CHEST: Clear to auscultation. No crackles or wheezes. Non tender on palpation. Diminished breath sounds bilaterally. CVS: S1 and S2 heard, no murmur. Regular rate and rhythm. No pericardial rub. ABDOMEN: Soft, non-tender, bowel sounds are present. No palpable hepato-splenomegaly. EXTREMITIES: Left lower extremity BKA, Ace wrap in place. CNS: Cranial nerves are intact. No focal motor or sensory deficits. SKIN: warm and dry  Data Review: I have personally reviewed the following laboratory data and studies,  CBC: Recent Labs  Lab 04/10/19 1400 04/11/19 0514 04/11/19 2358 04/13/19 0332 04/14/19 0000 04/15/19 0418  WBC 10.7* 10.4 10.9* 9.7 10.9* 10.6*  NEUTROABS 7.9*  --   --   --   --   --   HGB 11.3* 11.2* 10.5* 11.3* 10.8* 10.4*  HCT 36.2* 35.9* 32.9* 36.3* 34.4* 33.2*  MCV 91.6 91.1 90.6 91.7 92.0 91.2  PLT 215 200 195 231 201 814   Basic Metabolic  Panel: Recent Labs  Lab 04/11/19 0514 04/11/19 2358 04/13/19 0332 04/14/19 0000 04/15/19 0418  NA 132* 134* 129* 136 133*  K 5.1 4.1 5.3* 4.2 5.4*  CL 89* 93* 89* 95* 94*  CO2 26 32 25 28 20*  GLUCOSE 191* 114* 114* 124* 170*  BUN 33* 14 26* 17 36*  CREATININE 4.47* 2.69* 4.08* 2.97* 4.20*  CALCIUM 9.2 8.7* 9.3 9.6 10.3   Liver Function Tests: Recent Labs  Lab 04/10/19 1305  AST 26  ALT 18  ALKPHOS 134*  BILITOT 1.8*  PROT 6.5  ALBUMIN 2.9*   No results for input(s): LIPASE, AMYLASE in the last 168 hours. No results for input(s): AMMONIA in the last 168 hours. Cardiac Enzymes: Recent Labs  Lab 04/10/19 1305 04/11/19 0514  TROPONINI 0.63* 0.61*   BNP (last 3 results) Recent Labs    07/27/18 0129 07/30/18 0955 11/29/18 1854  BNP 2,421.8* 3,146.0* 792.7*    ProBNP (last 3  results) No results for input(s): PROBNP in the last 8760 hours.  CBG: Recent Labs  Lab 04/14/19 0917 04/14/19 1134 04/14/19 1611 04/14/19 2135 04/15/19 0657  GLUCAP 109* 112* 153* 160* 163*   Recent Results (from the past 240 hour(s))  SARS Coronavirus 2 (CEPHEID - Performed in [place] hospital lab), Hosp Order     Status: None   Collection Time: 04/10/19 12:56 PM  Result Value Ref Range Status   SARS Coronavirus 2 NEGATIVE NEGATIVE Final    Comment: (NOTE) If result is NEGATIVE SARS-CoV-2 target nucleic acids are NOT DETECTED. The SARS-CoV-2 RNA is generally detectable in upper and lower  respiratory specimens during the acute phase of infection. The lowest  concentration of SARS-CoV-2 viral copies this assay can detect is 250  copies / mL. A negative result does not preclude SARS-CoV-2 infection  and should not be used as the sole basis for treatment or other  patient management decisions.  A negative result may occur with  improper specimen collection / handling, submission of specimen other  than nasopharyngeal swab, presence of viral mutation(s) within the  areas targeted by this assay, and inadequate number of viral copies  (<250 copies / mL). A negative result must be combined with clinical  observations, patient history, and epidemiological information. If result is POSITIVE SARS-CoV-2 target nucleic acids are DETECTED. The SARS-CoV-2 RNA is generally detectable in upper and lower  respiratory specimens dur ing the acute phase of infection.  Positive  results are indicative of active infection with SARS-CoV-2.  Clinical  correlation with patient history and other diagnostic information is  necessary to determine patient infection status.  Positive results do  not rule out bacterial infection or co-infection with other viruses. If result is PRESUMPTIVE POSTIVE SARS-CoV-2 nucleic acids MAY BE PRESENT.   A presumptive positive result was obtained on the submitted  specimen  and confirmed on repeat testing.  While 2019 novel coronavirus  (SARS-CoV-2) nucleic acids may be present in the submitted sample  additional confirmatory testing may be necessary for epidemiological  and / or clinical management purposes  to differentiate between  SARS-CoV-2 and other Sarbecovirus currently known to infect humans.  If clinically indicated additional testing with an alternate test  methodology [phone]) is advised. The SARS-CoV-2 RNA is generally  detectable in upper and lower respiratory sp ecimens during the acute  phase of infection. The expected result is Negative. Fact Sheet for Patients:  StrictlyIdeas.no Fact Sheet for Healthcare Providers: BankingDealers.co.za This test is not yet approved or cleared  by the [place] and has been authorized for detection and/or diagnosis of SARS-CoV-2 by FDA under an Emergency Use Authorization (EUA).  This EUA will remain in effect (meaning this test can be used) for the duration of the COVID-19 declaration under Section 564(b)(1) of the Act, 21 U.S.C. section 360bbb-3(b)(1), unless the authorization is terminated or revoked sooner. Performed at [facility], [place] [address] [place], [place] [address]   Culture, blood (routine x 2)     Status: None (Preliminary result)   Collection Time: 04/10/19  2:04 PM  Result Value Ref Range Status   Specimen Description BLOOD RIGHT ANTECUBITAL  Final   Special Requests   Final    AEROBIC BOTTLE ONLY Blood Culture results may not be optimal due to an excessive volume of blood received in culture bottles   Culture   Final    NO GROWTH 4 DAYS Performed at [facility], [place] [address] [place], [place] [address]    Report Status PENDING  Incomplete  MRSA PCR Screening     Status: None   Collection Time: 04/10/19  5:17 PM  Result Value Ref Range Status   MRSA by PCR NEGATIVE NEGATIVE Final    Comment:         The GeneXpert MRSA Assay (FDA approved for NASAL specimens only), is one component of a comprehensive MRSA colonization surveillance program. It is not intended to diagnose MRSA infection nor to guide or monitor treatment for MRSA infections. Performed at [facility], [place] [address] [place], [place] [address]   Culture, blood (routine x 2)     Status: None (Preliminary result)   Collection Time: 04/10/19  5:54 PM  Result Value Ref Range Status   Specimen Description BLOOD RIGHT HAND  Final   Special Requests   Final    BOTTLES DRAWN AEROBIC AND ANAEROBIC Blood Culture adequate volume   Culture   Final    NO GROWTH 4 DAYS Performed at [facility], [place] [address] [place], [place] [address]    Report Status PENDING  Incomplete  Surgical pcr screen     Status: None   Collection Time: 04/14/19  5:46 AM  Result Value Ref Range Status   MRSA, PCR NEGATIVE NEGATIVE Final   Staphylococcus aureus NEGATIVE NEGATIVE Final    Comment: (NOTE) The Xpert SA Assay (FDA approved for NASAL specimens in patients 44 years of age and older), is one component of a comprehensive surveillance program. It is not intended to diagnose infection nor to guide or monitor treatment. Performed at [facility], [place] [address], [place], [place] [address]      Studies: No results found.  Scheduled Meds: . atorvastatin  80 mg Oral QPM  . Chlorhexidine Gluconate Cloth  6 each Topical Q0600  . cinacalcet  30 mg Oral Q T,Th,Sat-1800  . doxercalciferol  1 mcg Intravenous Q T,Th,Sa-HD  . insulin aspart  0-15 Units Subcutaneous TID WC  . insulin aspart  0-5 Units Subcutaneous QHS  . insulin detemir  18 Units Subcutaneous Daily  . lidocaine-prilocaine  1 application Topical See admin instructions  . metoprolol tartrate  12.5 mg Oral 2 times per day on Sun Mon Wed Fri  . midodrine  10 mg Oral Once per day on Mon Tue Thu Sat  . midodrine  2.5 mg Oral TID WC  . mometasone-formoterol  2  puff Inhalation BID  . pantoprazole  40 mg Oral QPM  .  sevelamer carbonate  2,400 mg Oral TID WC  . sodium chloride flush  3 mL Intravenous Q12H  . vancomycin variable dose per unstable renal function (pharmacist dosing)   Does not apply See admin instructions    Continuous Infusions: . cefTAZidime (FORTAZ)  IV 1 g (04/14/19 1939)  . heparin 1,400 Units/hr (04/14/19 1845)  . vancomycin       Flora Lipps, MD  [facility] 04/15/2019

## 2019-04-15 NOTE — Progress Notes (Addendum)
[facility] Progress Note   Subjective:  Not seen or dialyzed yesterday - was in OR for large part of day having L BKA. Seen in room this morning - says leg pain was awful last night, much better today. No CP.   Objective Vitals:   04/14/19 2048 04/15/19 0421 04/15/19 0841 04/15/19 0940  BP: (!) 109/54 (!) 116/58  (!) 83/41  Pulse: (!) 59 62  60  Resp: 19 18  18   Temp: 98.4 F (36.9 C) 98.4 F (36.9 C)  (!) 97.5 F (36.4 C)  TempSrc: Oral Oral  Oral  SpO2: 99% 100% 100% 99%  Weight: 97.6 kg     Height:       Physical Exam General: Well appearing man, NAD. Wearing nasal oxygen Heart: RRR; no murmur Lungs: CTA anteriorly Abdomen: soft, non-tender Extremities: Trace RLE edema; L BKA bandaged Dialysis Access: L AVF + bruit  Additional Objective Labs: Basic Metabolic Panel: Recent Labs  Lab 04/13/19 0332 04/14/19 0000 04/15/19 0418  NA 129* 136 133*  K 5.3* 4.2 5.4*  CL 89* 95* 94*  CO2 25 28 20*  GLUCOSE 114* 124* 170*  BUN 26* 17 36*  CREATININE 4.08* 2.97* 4.20*  CALCIUM 9.3 9.6 10.3   Liver Function Tests: Recent Labs  Lab 04/10/19 1305  AST 26  ALT 18  ALKPHOS 134*  BILITOT 1.8*  PROT 6.5  ALBUMIN 2.9*   CBC: Recent Labs  Lab 04/10/19 1400 04/11/19 0514 04/11/19 2358 04/13/19 0332 04/14/19 0000 04/15/19 0418  WBC 10.7* 10.4 10.9* 9.7 10.9* 10.6*  NEUTROABS 7.9*  --   --   --   --   --   HGB 11.3* 11.2* 10.5* 11.3* 10.8* 10.4*  HCT 36.2* 35.9* 32.9* 36.3* 34.4* 33.2*  MCV 91.6 91.1 90.6 91.7 92.0 91.2  PLT 215 200 195 231 201 219   Blood Culture    Component Value Date/Time   SDES BLOOD RIGHT HAND 04/10/2019 1754   SPECREQUEST  04/10/2019 1754    BOTTLES DRAWN AEROBIC AND ANAEROBIC Blood Culture adequate volume   CULT  04/10/2019 1754    NO GROWTH 4 DAYS Performed at [facility], [place] [address], [place], [place] [address]    REPTSTATUS PENDING 04/10/2019 1754   Cardiac Enzymes: Recent Labs  Lab 04/10/19 1305  04/11/19 0514  TROPONINI 0.63* 0.61*   CBG: Recent Labs  Lab 04/14/19 0917 04/14/19 1134 04/14/19 1611 04/14/19 2135 04/15/19 0657  GLUCAP 109* 112* 153* 160* 163*   Medications: . cefTAZidime (FORTAZ)  IV 1 g (04/14/19 1939)  . heparin 1,400 Units/hr (04/14/19 1845)  . vancomycin     . atorvastatin  80 mg Oral QPM  . Chlorhexidine Gluconate Cloth  6 each Topical Q0600  . cinacalcet  30 mg Oral Q T,Th,Sat-1800  . doxercalciferol  1 mcg Intravenous Q T,Th,Sa-HD  . insulin aspart  0-15 Units Subcutaneous TID WC  . insulin aspart  0-5 Units Subcutaneous QHS  . insulin detemir  18 Units Subcutaneous Daily  . lidocaine-prilocaine  1 application Topical See admin instructions  . metoprolol tartrate  12.5 mg Oral 2 times per day on Sun Mon Wed Fri  . midodrine  10 mg Oral Once per day on Mon Tue Thu Sat  . midodrine  2.5 mg Oral TID WC  . mometasone-formoterol  2 puff Inhalation BID  . pantoprazole  40 mg Oral QPM  . sevelamer carbonate  2,400 mg Oral TID WC  . sodium chloride flush  3 mL Intravenous Q12H  . vancomycin variable dose per unstable renal function (pharmacist dosing)   Does not apply See admin instructions   Dialysis Orders: [facility]  4 hours Optiflux 200 400/800 EDW 97.5kg UFP 4 LUE AVF No heparin  Hectorol 1 mcg TIW  Assessment/ Plan: 78 y.o. male with ESRD who was admitted on 04/10/2019 with L foot gangrene.  1. L foot gangrene/osteomyelitis: BCx negative, on Vanc/Ceftaz. 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 Pain control per primary. 2. ESRD: On HD 4x/week for volume (MTTS) - missed HD yesterday d/t surgery - for HD later today, then back to usual schedule. No heparin. 3. HypoTN/volume: Chronic hypotension. On midodrine for BP support. UF to EDW as tolerated. 4. Anemia: Hgb 10.4, likely will have further post-op drop. Follow. 5. Metabolic bone disease: Continue  Hectorol and Sensipar/Renvela binder.  6. Afib: S/p PM. On Eliquis metoprolol. Holding Eliquis for  surgery  7. DM Type 2: Hgb A1c 8.4%. Insulin per primary   Veneta Penton, PA-C 04/15/2019, 10:54 AM  [facility] Pager: [phone]  Pt seen, examined and agree w A/P as above. HD today off schedule, and doesn't need 4 HD / wk schedule while here we can just do 3 / wk.   Kelly Splinter  MD 04/15/2019, 3:41 PM

## 2019-04-15 NOTE — Progress Notes (Signed)
Occupational Therapy Evaluation Patient Details Name: Jeffrey [name] MRN: [id number] DOB: [birth year] Today's Date: 04/15/2019    History of Present Illness Jeffrey [name] is a 78 y.o. male with history of CAD status post stenting, ESRD on hemodialysis Tuesday Thursday Saturday, diabetes mellitus type 2, anemia, CHF, pacemaker placement, status post TAVR, A. fib on apixaban presents to the ER with complaints of increasing swelling of the left lower extremity over the last 4 days with discoloration of the middle 3 toes of the left foot over the last 2 days.    Clinical Impression   PTA, pt was living at home with his wife, and reports he was independent with ADL/IADL and functional mobility. Pt currently requires modA+2 for functional mobility and setupA for upper body ADL and modA for LB ADL.  Due to decline in current level of function, pt would benefit from acute OT to address established goals to facilitate safe D/C to venue listed below. At this time, recommend SNF follow-up. Will continue to follow acutely.     Follow Up Recommendations  SNF;Supervision/Assistance - 24 hour    Equipment Recommendations  3 in 1 bedside commode    Recommendations for Other Services       Precautions / Restrictions Precautions Precautions: Fall Restrictions Weight Bearing Restrictions: No      Mobility Bed Mobility Overal bed mobility: Needs Assistance Bed Mobility: Rolling;Sidelying to Sit;Sit to Sidelying Rolling: Mod assist Sidelying to sit: Max assist;HOB elevated     Sit to sidelying: Max assist;HOB elevated General bed mobility comments: max A of 2 to assist up, cues for using bed rails with BUE, support of trunk.   Transfers Overall transfer level: Needs assistance Equipment used: Rolling walker (2 wheeled) Transfers: Sit to/from Stand Sit to Stand: Mod assist         General transfer comment: defered lateral or squat pivot transfer to chair due to patients fatigue. patient  with lateral scooting on slight decline EOB for majority of session. Mod A needed towards end due to fatigue.     Balance Overall balance assessment: Needs assistance Sitting-balance support: Feet supported Sitting balance-Leahy Scale: Fair Sitting balance - Comments: Able to sit with close min guard -Mod A for sitting balance; decreased arousal at times. Postural control: Right lateral lean   Standing balance-Leahy Scale: Poor                             ADL either performed or assessed with clinical judgement   ADL Overall ADL's : Needs assistance/impaired Eating/Feeding: Set up;Sitting   Grooming: Set up;Sitting   Upper Body Bathing: Set up;Sitting   Lower Body Bathing: Moderate assistance;+2 for physical assistance;Sit to/from stand   Upper Body Dressing : Minimal assistance;Sitting   Lower Body Dressing: Moderate assistance Lower Body Dressing Details (indicate cue type and reason): pt unable to reach foot necessary to complete dressing Toilet Transfer: +2 for physical assistance;Moderate assistance Toilet Transfer Details (indicate cue type and reason): did not attempt this session         Functional mobility during ADLs: +2 for physical assistance;Moderate assistance       Vision         Perception     Praxis      Pertinent Vitals/Pain Pain Assessment: Faces Faces Pain Scale: Hurts little more Pain Location: left residual limb Pain Descriptors / Indicators: Burning;Sore Pain Intervention(s): Limited activity within patient's tolerance;Monitored during session  Hand Dominance Right   Extremity/Trunk Assessment Upper Extremity Assessment Upper Extremity Assessment: Generalized weakness   Lower Extremity Assessment Lower Extremity Assessment: Defer to PT evaluation RLE Sensation: (foot)   Cervical / Trunk Assessment Cervical / Trunk Assessment: Kyphotic   Communication Communication Communication: HOH   Cognition  Arousal/Alertness: Awake/alert Behavior During Therapy: WFL for tasks assessed/performed Overall Cognitive Status: Within Functional Limits for tasks assessed                                 General Comments: for simple mobility;pt verbalized understanding of phantom limb pain. reports no feelings to date   General Comments  pt on 3lnc;DoE 3/4 during functional mobility;pt required frequent rest breaks    Exercises     Shoulder Instructions      Home Living Family/patient expects to be discharged to:: Private residence Living Arrangements: Spouse/significant other;Children(son) Available Help at Discharge: Family;Available 24 hours/day Type of Home: House Home Access: Ramped entrance     Home Layout: One level     Bathroom Shower/Tub: Teacher, early years/pre: Standard     Home Equipment: Walker - 2 wheels;Grab bars - toilet;Grab bars - tub/shower;Cane - single point;Bedside commode;Walker - 4 wheels   Additional Comments: home O2      Prior Functioning/Environment Level of Independence: Needs assistance  Gait / Transfers Assistance Needed: Reports needing assist with transfers; propels w/c at home. Has not walked in 4 months ADL's / Homemaking Assistance Needed: Wife assist for all ADLs. Past few days, wife providing bed baths.    Comments: Daughter works as Designer, multimedia at senior living facility and hesitant to have pt d/c to SNF        OT Problem List: Decreased strength;Decreased activity tolerance;Impaired balance (sitting and/or standing);Decreased safety awareness;Decreased knowledge of use of DME or AE;Pain      OT Treatment/Interventions: Self-care/ADL training;Therapeutic exercise;Energy conservation;DME and/or AE instruction;Therapeutic activities;Patient/family education;Balance training    OT Goals(Current goals can be found in the care plan section) Acute Rehab OT Goals Patient Stated Goal: to stand up  OT Goal Formulation: With  patient Time For Goal Achievement: 04/29/19 Potential to Achieve Goals: Good ADL Goals Pt Will Perform Grooming: with modified independence;standing Pt Will Perform Upper Body Bathing: with modified independence;sitting;standing Pt Will Perform Lower Body Dressing: with modified independence;sit to/from stand Pt Will Transfer to Toilet: with modified independence;ambulating  OT Frequency: Min 2X/week   Barriers to D/C: Decreased caregiver support  unsure if pt has appropriate physical support available at home       Co-evaluation PT/OT/SLP Co-Evaluation/Treatment: Yes Reason for Co-Treatment: To address functional/ADL transfers PT goals addressed during session: Mobility/safety with mobility;Balance;Proper use of DME;Strengthening/ROM OT goals addressed during session: ADL's and self-care      AM-PAC OT "6 Clicks" Daily Activity     Outcome Measure Help from another person eating meals?: None Help from another person taking care of personal grooming?: A Little Help from another person toileting, which includes using toliet, bedpan, or urinal?: A Lot Help from another person bathing (including washing, rinsing, drying)?: A Lot Help from another person to put on and taking off regular upper body clothing?: A Little Help from another person to put on and taking off regular lower body clothing?: A Lot 6 Click Score: 16   End of Session Equipment Utilized During Treatment: Gait belt;Rolling walker;Oxygen Nurse Communication: Mobility status  Activity Tolerance: Patient tolerated treatment well Patient left:  in bed;with call bell/phone within reach;with bed alarm set  OT Visit Diagnosis: Unsteadiness on feet (R26.81);Other abnormalities of gait and mobility (R26.89);Muscle weakness (generalized) (M62.81);Pain Pain - Right/Left: Left Pain - part of body: Leg                Time: 1982-4299 OT Time Calculation (min): 25 min Charges:  OT General Charges $OT Visit: 1 Visit OT  Evaluation $OT Eval Moderate Complexity: [place] OTR/L Acute Rehabilitation Services Office: [place] 04/15/2019, 12:09 PM

## 2019-04-15 NOTE — Progress Notes (Addendum)
[facility] for heparin>>apixaban Indication: atrial fibrillation  Allergies  Allergen Reactions  . Byetta 10 Mcg Pen [Exenatide] Diarrhea and Nausea And Vomiting  . Codeine Itching  . Coumadin [Warfarin Sodium] Rash    (wife states coumadin was stopped but rash did not disappear 02/21/19)   Patient Measurements: Height: 5\' 11"  (180.3 cm) Weight: 215 lb 2.7 oz (97.6 kg) IBW/kg (Calculated) : 75.3 Heparin Dosing Weight: 96.2 kg  Vital Signs: Temp: 97.5 F (36.4 C) (05/20 0940) Temp Source: Oral (05/20 0940) BP: 83/41 (05/20 0940) Pulse Rate: 60 (05/20 0940)  Labs: Recent Labs    04/13/19 0332 04/13/19 1519 04/13/19 2352 04/14/19 0000 04/15/19 0418 04/15/19 1053  HGB 11.3*  --   --  10.8* 10.4*  --   HCT 36.3*  --   --  34.4* 33.2*  --   PLT 231  --   --  201 219  --   APTT 135* 81*  --   --  70* 81*  LABPROT  --   --  19.8*  --   --   --   INR  --   --  1.7*  --   --   --   HEPARINUNFRC 0.84* 0.52 0.45  --  0.34 0.41  CREATININE 4.08*  --   --  2.97* 4.20*  --    Estimated Creatinine Clearance: 16.7 mL/min (A) (by C-G formula based on SCr of 4.2 mg/dL (H)).  Assessment: 55 yoM admitted 5/15 for left foot wound. PMH afib on Eliquis PTA (last dose 5/15 AM), currently on hold for surgery. Pharmacy consulted to dose IV heparin.   The patient is now s/p BKA. APTT 81, HL 0.41 both are therapeutic   Goal of Therapy:  Heparin level 0.3-0.7 units/ml aPTT 66-102 seconds Monitor platelets by anticoagulation protocol: Yes   Plan: - Continune Heparin at a rate of 1400 units/hr (14 ml/hr)  - Will monitor bleeding at dressing site - Daily Heparin level now that correlating with APTT  Khyri Hinzman A. Levada Dy, PharmD, [place] Please utilize Amion for appropriate phone number to reach the unit pharmacist ([place])  Addendum:  Received consult to change heparin to apixaban per home. Patient on apixaban 2.5mg  PO  BID PTA. Dose appropriate for age and renal function. Will restart at PTA dose.  Plan:   D/C heparin Start apixaban 2.5mg  BID  Lashell Moffitt A. Levada Dy, PharmD, [place] Please utilize Amion for appropriate phone number to reach the unit pharmacist ([place])

## 2019-04-16 LAB — CBC
HCT: 29.7 % — ABNORMAL LOW (ref 39.0–52.0)
Hemoglobin: 9.5 g/dL — ABNORMAL LOW (ref 13.0–17.0)
MCH: 29 pg (ref 26.0–34.0)
MCHC: 32 g/dL (ref 30.0–36.0)
MCV: 90.5 fL (ref 80.0–100.0)
Platelets: 191 10*3/uL (ref 150–400)
RBC: 3.28 MIL/uL — ABNORMAL LOW (ref 4.22–5.81)
RDW: 16.7 % — ABNORMAL HIGH (ref 11.5–15.5)
WBC: 9.7 10*3/uL (ref 4.0–10.5)
nRBC: 0.2 % (ref 0.0–0.2)

## 2019-04-16 LAB — MAGNESIUM: Magnesium: 1.9 mg/dL (ref 1.7–2.4)

## 2019-04-16 LAB — BASIC METABOLIC PANEL
Anion gap: 13 (ref 5–15)
BUN: 18 mg/dL (ref 8–23)
CO2: 29 mmol/L (ref 22–32)
Calcium: 9.3 mg/dL (ref 8.9–10.3)
Chloride: 93 mmol/L — ABNORMAL LOW (ref 98–111)
Creatinine, Ser: 2.78 mg/dL — ABNORMAL HIGH (ref 0.61–1.24)
GFR calc Af Amer: 24 mL/min — ABNORMAL LOW (ref >60)
GFR calc non Af Amer: 21 mL/min — ABNORMAL LOW (ref >60)
Glucose, Bld: 133 mg/dL — ABNORMAL HIGH (ref 70–99)
Potassium: 3.9 mmol/L (ref 3.5–5.1)
Sodium: 135 mmol/L (ref 135–145)

## 2019-04-16 LAB — GLUCOSE, CAPILLARY
Glucose-Capillary: 119 mg/dL — ABNORMAL HIGH (ref 70–99)
Glucose-Capillary: 134 mg/dL — ABNORMAL HIGH (ref 70–99)
Glucose-Capillary: 182 mg/dL — ABNORMAL HIGH (ref 70–99)

## 2019-04-16 MED ORDER — OXYCODONE-ACETAMINOPHEN 5-325 MG PO TABS
1.0000 | ORAL_TABLET | ORAL | Status: DC | PRN
  Administered 2019-04-16: 1 via ORAL
  Filled 2019-04-16: qty 1

## 2019-04-16 MED ORDER — CYCLOBENZAPRINE HCL 5 MG PO TABS
5.0000 mg | ORAL_TABLET | Freq: Three times a day (TID) | ORAL | Status: DC | PRN
  Administered 2019-04-17: 5 mg via ORAL
  Filled 2019-04-16: qty 1

## 2019-04-16 MED ORDER — HYDROMORPHONE HCL 1 MG/ML IJ SOLN
0.5000 mg | INTRAMUSCULAR | Status: DC | PRN
  Administered 2019-04-16 – 2019-04-17 (×4): 0.5 mg via INTRAVENOUS
  Filled 2019-04-16 (×3): qty 1

## 2019-04-16 MED ORDER — HYDROMORPHONE HCL 1 MG/ML IJ SOLN
INTRAMUSCULAR | Status: AC
  Administered 2019-04-16: 0.5 mg via INTRAVENOUS
  Filled 2019-04-16: qty 0.5

## 2019-04-16 MED ORDER — MIDODRINE HCL 5 MG PO TABS
ORAL_TABLET | ORAL | Status: AC
  Administered 2019-04-16: 10 mg via ORAL
  Filled 2019-04-16: qty 2

## 2019-04-16 NOTE — Progress Notes (Addendum)
Patient BP 84/62. HR 55. Jeannette Corpus notified. No new orders at this time just continue to monitor.

## 2019-04-16 NOTE — Progress Notes (Addendum)
  Progress Note    04/16/2019 11:59 AM 2 Days Post-Op  Subjective:  Some pain in stump today   Vitals:   04/16/19 0858 04/16/19 1020  BP:  (!) 94/53  Pulse:  60  Resp:  18  Temp:  97.8 F (36.6 C)  SpO2: 92% 98%    Physical Exam: Incisions:  L BKA incision c/d/i   CBC    Component Value Date/Time   WBC 9.7 04/16/2019 0425   RBC 3.28 (L) 04/16/2019 0425   HGB 9.5 (L) 04/16/2019 0425   HGB 9.2 (L) 01/07/2019 1108   HCT 29.7 (L) 04/16/2019 0425   HCT 29.6 (L) 01/07/2019 1108   PLT 191 04/16/2019 0425   PLT 211 01/07/2019 1108   MCV 90.5 04/16/2019 0425   MCV 93 01/07/2019 1108   MCH 29.0 04/16/2019 0425   MCHC 32.0 04/16/2019 0425   RDW 16.7 (H) 04/16/2019 0425   RDW 17.3 (H) 01/07/2019 1108   LYMPHSABS 1.0 04/10/2019 1400   LYMPHSABS 1.4 01/07/2019 1108   MONOABS 1.2 (H) 04/10/2019 1400   EOSABS 0.5 04/10/2019 1400   EOSABS 0.1 01/07/2019 1108   BASOSABS 0.1 04/10/2019 1400   BASOSABS 0.0 01/07/2019 1108    BMET    Component Value Date/Time   NA 135 04/16/2019 0425   NA 137 01/07/2019 1108   K 3.9 04/16/2019 0425   CL 93 (L) 04/16/2019 0425   CO2 29 04/16/2019 0425   GLUCOSE 133 (H) 04/16/2019 0425   BUN 18 04/16/2019 0425   BUN 29 (H) 01/07/2019 1108   CREATININE 2.78 (H) 04/16/2019 0425   CALCIUM 9.3 04/16/2019 0425   GFRNONAA 21 (L) 04/16/2019 0425   GFRAA 24 (L) 04/16/2019 0425    INR    Component Value Date/Time   INR 1.7 (H) 04/13/2019 2352     Intake/Output Summary (Last 24 hours) at 04/16/2019 1159 Last data filed at 04/16/2019 0900 Gross per 24 hour  Intake 384.85 ml  Output 300 ml  Net 84.85 ml     Assessment/Plan:  78 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 I agree with the PA note as documented above. L BKA healing without issue.  Pain better controlled now.  IV dilaudid ordered PRN.  OK to restart Eliquis.  Marty Heck, MD [facility] [place] Office: [phone] Pager: [phone]

## 2019-04-16 NOTE — Progress Notes (Signed)
[facility] Progress Note   Subjective:  No c/o's today   Objective Vitals:   04/16/19 0134 04/16/19 0455 04/16/19 0832 04/16/19 0858  BP: (!) 90/42 (!) 88/39 (!) 85/62   Pulse: (!) 54 (!) 58    Resp:  15    Temp:  98.5 F (36.9 C)    TempSrc:      SpO2:  92%  92%  Weight:      Height:       Physical Exam General: Well appearing man, NAD. Wearing nasal oxygen Heart: RRR; no murmur Lungs: CTA anteriorly Abdomen: soft, non-tender Extremities: Trace RLE edema; L BKA bandaged Dialysis Access: L AVF + bruit  Dialysis Orders: [facility] MTThS  4h    F200   400/800    97.5kg   P4   LUE AVF   Hep none Hectorol 1 mcg TIW  Assessment/ Plan: 78 y.o. male with ESRD who was admitted on 04/10/2019 with L foot gangrene.  1. L foot gangrene/osteomyelitis: BCx negative, on Vanc/Ceftaz. 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 Pain control per primary. 2. ESRD: On HD 4x/week for volume (MTTS), had HD Mon and Wed this week. Plan HD today back on sched.  3. HypoTN/volume: Chronic hypotension. On midodrine for BP support. Will need lower edw by 2-3kg after BKA.  Keep SBP > 80 4. Anemia: Hb stable postop 11 today. Follow. 5. Metabolic bone disease: Continue  Hectorol and Sensipar/Renvela binder.  6. Afib: S/p PM. On Eliquis metoprolol. Holding Eliquis for surgery  7. DM Type 2: Hgb A1c 8.4%. Insulin per primary    Kelly Splinter  MD 04/16/2019, 10:10 AM       Additional Objective Labs: Basic Metabolic Panel: Recent Labs  Lab 04/14/19 0000 04/15/19 0418 04/16/19 0425  NA 136 133* 135  K 4.2 5.4* 3.9  CL 95* 94* 93*  CO2 28 20* 29  GLUCOSE 124* 170* 133*  BUN 17 36* 18  CREATININE 2.97* 4.20* 2.78*  CALCIUM 9.6 10.3 9.3   Liver Function Tests: Recent Labs  Lab 04/10/19 1305  AST 26  ALT 18  ALKPHOS 134*  BILITOT 1.8*  PROT 6.5  ALBUMIN 2.9*   CBC: Recent Labs  Lab 04/10/19 1400  04/11/19 2358 04/13/19 0332 04/14/19 0000 04/15/19 0418 04/16/19 0425  WBC 10.7*    < > 10.9* 9.7 10.9* 10.6* 9.7  NEUTROABS 7.9*  --   --   --   --   --   --   HGB 11.3*   < > 10.5* 11.3* 10.8* 10.4* 9.5*  HCT 36.2*   < > 32.9* 36.3* 34.4* 33.2* 29.7*  MCV 91.6   < > 90.6 91.7 92.0 91.2 90.5  PLT 215   < > 195 231 201 219 191   < > = values in this interval not displayed.   Blood Culture    Component Value Date/Time   SDES BLOOD RIGHT HAND 04/10/2019 1754   SPECREQUEST  04/10/2019 1754    BOTTLES DRAWN AEROBIC AND ANAEROBIC Blood Culture adequate volume   CULT  04/10/2019 1754    NO GROWTH 5 DAYS Performed at [facility], [place] [address], [place], [place] [address]    REPTSTATUS 04/15/2019 FINAL 04/10/2019 1754   Cardiac Enzymes: Recent Labs  Lab 04/10/19 1305 04/11/19 0514  TROPONINI 0.63* 0.61*   CBG: Recent Labs  Lab 04/15/19 0657 04/15/19 1114 04/15/19 1629 04/15/19 2319 04/16/19 0709  GLUCAP 163* 160* 146* 123* 119*   Medications:  . apixaban  2.5 mg Oral BID  . atorvastatin  80 mg Oral QPM  . Chlorhexidine Gluconate Cloth  6 each Topical Q0600  . cinacalcet  30 mg Oral Q T,Th,Sat-1800  . doxercalciferol  1 mcg Intravenous Q T,Th,Sa-HD  . insulin aspart  0-15 Units Subcutaneous TID WC  . insulin aspart  0-5 Units Subcutaneous QHS  . insulin detemir  18 Units Subcutaneous Daily  . lidocaine-prilocaine  1 application Topical See admin instructions  . metoprolol tartrate  12.5 mg Oral 2 times per day on Sun Mon Wed Fri  . midodrine  10 mg Oral Once per day on Mon Tue Thu Sat  . midodrine  2.5 mg Oral TID WC  . mometasone-formoterol  2 puff Inhalation BID  . pantoprazole  40 mg Oral QPM  . sevelamer carbonate  2,400 mg Oral TID WC  . sodium chloride flush  3 mL Intravenous Q12H

## 2019-04-16 NOTE — Progress Notes (Signed)
PROGRESS NOTE  Jeffrey [name] KTG:[id number] DOB: [birth year] DOA: 04/10/2019 PCP: Scot Jun, FNP   LOS: 6 days   Brief narrative:  Jeffrey [name] a 78 y.o.malewith medical history significant ofCHF,s/ppacemaker placement, s/pTAVR, A. fib on apixaban, CADs/pstent, ESRD onHD,DMtype 2, PVD,andanemia; who presented to the hospital with complaints of worsening left foot pain.Admitted in March for cellulitis of the same foot, but reported that has not been getting better. At this time, he reports that he is wheelchair-bound and his son and wife help him with transfers at home. He hadan abdominal aortogram along with thelower extremities performed by Dr. Scot Dock on 5/1. Based off of that study, he was thought to have reasonable flow to the left foot with real good options for revascularization. At that time, he felt that his toes were similarto nowwith dark appearance of the second,third,and fourth toe. He was advised to wash foot daily and keep it clean which he has done. However, over the  14 days prior to presentation, he developed progressively worsening pain.  On admission to the emergency department, patient was noted to be afebrile, pulse 51-60, respirations14-23, blood pressure 93/48-147/129, and O2 saturations maintained on 2 L nasal cannula oxygen. Labs revealed WBC 10.7, hemoglobin 11.3, sodium 132, potassium 5, BUN 26, creatinine 3.72, glucose 222, lactic acid 2.1 ->2.2,and troponin 0.63. X-rays revealed erosive osseous changes of the fourth digit left foot concerning for osteomyelitis. Blood cultures were obtained. Patient was given empiric antibiotics of vancomycin and Fortaz.  Subjective: Patient complains of severe pain at the amputated site today.  Denies any chest pain palpitation shortness of breath cough or fever.    Assessment/Plan:  Active Problems:   Diabetes type 2, controlled (HCC)   ESRD (end stage renal disease) on dialysis (HCC)    Anemia of chronic disease   Automatic implantable cardioverter-defibrillator in situ   Chronic respiratory failure with hypoxia (HCC)   Troponin level elevated   Osteomyelitis (HCC)  Severe pain at the amputation site .  Status post left below-knee amputation.  Change morphine to Dilaudid, change Norco to oxycodone today.  Will closely monitor.  Sepsis likely secondary to left foot osteomyelitis with gangrene s/p Left BKA 5/19 by Dr. Scot Dock.  Patient was on vancomycin and Fortaz.  Currently off antibiotic since the source has been controlled and there is no bacteremia.  Continue physical therapy, occupational therapy.  WBC is 9.7 today.  Blood cultures are negative in 4 days.  T-max of 98.5 F.  Patient will need to follow-up with vascular surgery as outpatient in 4 to 6 weeks for staple removal.  Vascular surgery office to arrange for this follow-up.  Permanent atrial fibrillation status post PPM.  On Eliquis and metoprolol as outpatient. History of PPM placement.  Patient has been started back on anticoagulation with Eliquis.  ESRD on HD Continue hemodialysis Tuesday, Thursday, Saturday schedule. Nephrology on board.   Type 2 diabetes mellitus Patient's home regimen includes Levemir 28 units nightly.  Patient states insulin dose recently reduced by PCP.  Hemoglobin A1c 6.3 on 01/07/2019.   Hemoglobin A1c up to 8.4 this time.  On reduced dose of Levemir here in the hospital with sliding scale insulin.  Glycemic control is adequate.  Peripheral vascular disease Plavix currently on hold.  Will resume today.  Chronic hypotension. Continue Midrin.  Patient is asymptomatic  Anemia of chronic disease:  Hemoglobin of 9.5 today.  Will monitor.  COPD, without exacerbation, chronic respiratory failure with hypoxia On nasal cannula oxygen at  2 L/min.  Continue nebulizers.  Aortic stenosiss/p TAVR:Stable  Anxiety --Continue Xanax as needed anxiety  VTE Prophylaxis: Eliquis   Code Status: Full code  Family Communication: I spoke with the patient's wife on the phone today and updated her about the clinical condition of the patient.  Patient's wife stated that she would not want her husband to be discharged to a skilled nursing facility but would want inpatient rehab.  Disposition Plan: Skilled nursing facility as per PT, focus on pain control today.  PT OT to continue.  Probably home with home health if family decides against a skilled nursing facility.   Consultants:  Nephrology, vascular surgery   Procedures: Left BKA 5/19 by Dr. Scot Dock.   Antibiotics: None  Anti-infectives (From admission, onward)   Start     Dose/Rate Route Frequency Ordered Stop   04/15/19 1200  vancomycin (VANCOCIN) IVPB 1000 mg/200 mL premix  Status:  Discontinued     1,000 mg 200 mL/hr over 60 Minutes Intravenous Every Wed (Hemodialysis) 04/14/19 1317 04/15/19 1403   04/14/19 1317  vancomycin variable dose per unstable renal function (pharmacist dosing)  Status:  Discontinued      Does not apply See admin instructions 04/14/19 1317 04/15/19 1403   04/14/19 0000  ceFAZolin (ANCEF) IVPB 1 g/50 mL premix  Status:  Discontinued    Note to Pharmacy:  Send with pt to OR   1 g 100 mL/hr over 30 Minutes Intravenous On call 04/13/19 0758 04/13/19 0758   04/13/19 1200  vancomycin (VANCOCIN) IVPB 1000 mg/200 mL premix     1,000 mg 200 mL/hr over 60 Minutes Intravenous Every Mon (Hemodialysis) 04/12/19 1232 04/13/19 1233   04/11/19 2000  vancomycin (VANCOCIN) IVPB 1000 mg/200 mL premix  Status:  Discontinued     1,000 mg 200 mL/hr over 60 Minutes Intravenous Every T-Th-Sa (Hemodialysis) 04/10/19 1449 04/14/19 1317   04/11/19 2000  cefTAZidime (FORTAZ) 1 g in sodium chloride 0.9 % 100 mL IVPB  Status:  Discontinued     1 g 200 mL/hr over 30 Minutes Intravenous Every 24 hours 04/10/19 1621 04/15/19 1403   04/10/19 1500  vancomycin (VANCOCIN) 2,000 mg in sodium chloride 0.9 % 500 mL IVPB      2,000 mg 250 mL/hr over 120 Minutes Intravenous  Once 04/10/19 1449 04/10/19 1733   04/10/19 1445  cefTAZidime (FORTAZ) 1 g in sodium chloride 0.9 % 100 mL IVPB     1 g 200 mL/hr over 30 Minutes Intravenous  Once 04/10/19 1431 04/10/19 1511      Objective: Vitals:   04/16/19 0858 04/16/19 1020  BP:  (!) 94/53  Pulse:  60  Resp:  18  Temp:  97.8 F (36.6 C)  SpO2: 92% 98%    Intake/Output Summary (Last 24 hours) at 04/16/2019 1404 Last data filed at 04/16/2019 0900 Gross per 24 hour  Intake 384.85 ml  Output 300 ml  Net 84.85 ml   Filed Weights   04/14/19 2048 04/15/19 1820 04/15/19 2245  Weight: 97.6 kg 98.7 kg 98.5 kg   Body mass index is 30.29 kg/m.   Physical Exam: General:  Average built, mild distress due to pain in the left BKA site.  On nasal cannula oxygen HENT: Normocephalic, pupils equally reacting to light and accommodation.  No scleral pallor or icterus noted. Oral mucosa is moist.  Chest:  Clear breath sounds.  Diminished breath sounds bilaterally. No crackles or wheezes.  CVS: S1 &S2 heard. No murmur.  Regular rate and rhythm.  Abdomen: Soft, nontender, nondistended.  Bowel sounds are heard.  Liver is not palpable, no abdominal mass palpated Extremities: Left lower extremity below-knee amputation, Ace wrap in place.  Trace right lower extremity edema.  Left upper extremity AV fistula with positive bruit Psych: Alert, awake and oriented, normal mood CNS:  No cranial nerve deficits.  Power equal in all extremities.  No sensory deficits noted.  No cerebellar signs.   Skin: Warm and dry.  Left below-knee amputation on ace wrap   Data Review: I have personally reviewed the following laboratory data and studies,  CBC: Recent Labs  Lab 04/10/19 1400  04/11/19 2358 04/13/19 0332 04/14/19 0000 04/15/19 0418 04/16/19 0425  WBC 10.7*   < > 10.9* 9.7 10.9* 10.6* 9.7  NEUTROABS 7.9*  --   --   --   --   --   --   HGB 11.3*   < > 10.5* 11.3* 10.8* 10.4* 9.5*   HCT 36.2*   < > 32.9* 36.3* 34.4* 33.2* 29.7*  MCV 91.6   < > 90.6 91.7 92.0 91.2 90.5  PLT 215   < > 195 231 201 219 191   < > = values in this interval not displayed.   Basic Metabolic Panel: Recent Labs  Lab 04/11/19 2358 04/13/19 0332 04/14/19 0000 04/15/19 0418 04/16/19 0425  NA 134* 129* 136 133* 135  K 4.1 5.3* 4.2 5.4* 3.9  CL 93* 89* 95* 94* 93*  CO2 32 25 28 20* 29  GLUCOSE 114* 114* 124* 170* 133*  BUN 14 26* 17 36* 18  CREATININE 2.69* 4.08* 2.97* 4.20* 2.78*  CALCIUM 8.7* 9.3 9.6 10.3 9.3  MG  --   --   --   --  1.9   Liver Function Tests: Recent Labs  Lab 04/10/19 1305  AST 26  ALT 18  ALKPHOS 134*  BILITOT 1.8*  PROT 6.5  ALBUMIN 2.9*   No results for input(s): LIPASE, AMYLASE in the last 168 hours. No results for input(s): AMMONIA in the last 168 hours. Cardiac Enzymes: Recent Labs  Lab 04/10/19 1305 04/11/19 0514  TROPONINI 0.63* 0.61*   BNP (last 3 results) Recent Labs    07/27/18 0129 07/30/18 0955 11/29/18 1854  BNP 2,421.8* 3,146.0* 792.7*    ProBNP (last 3 results) No results for input(s): PROBNP in the last 8760 hours.  CBG: Recent Labs  Lab 04/15/19 1114 04/15/19 1629 04/15/19 2319 04/16/19 0709 04/16/19 1124  GLUCAP 160* 146* 123* 119* 134*   Recent Results (from the past 240 hour(s))  SARS Coronavirus 2 (CEPHEID - Performed in [facility]), Hosp Order     Status: None   Collection Time: 04/10/19 12:56 PM  Result Value Ref Range Status   SARS Coronavirus 2 NEGATIVE NEGATIVE Final    Comment: (NOTE) If result is NEGATIVE SARS-CoV-2 target nucleic acids are NOT DETECTED. The SARS-CoV-2 RNA is generally detectable in upper and lower  respiratory specimens during the acute phase of infection. The lowest  concentration of SARS-CoV-2 viral copies this assay can detect is 250  copies / mL. A negative result does not preclude SARS-CoV-2 infection  and should not be used as the sole basis for treatment or other   patient management decisions.  A negative result may occur with  improper specimen collection / handling, submission of specimen other  than nasopharyngeal swab, presence of viral mutation(s) within the  areas targeted by this assay, and inadequate number of viral copies  (<250 copies /  mL). A negative result must be combined with clinical  observations, patient history, and epidemiological information. If result is POSITIVE SARS-CoV-2 target nucleic acids are DETECTED. The SARS-CoV-2 RNA is generally detectable in upper and lower  respiratory specimens dur ing the acute phase of infection.  Positive  results are indicative of active infection with SARS-CoV-2.  Clinical  correlation with patient history and other diagnostic information is  necessary to determine patient infection status.  Positive results do  not rule out bacterial infection or co-infection with other viruses. If result is PRESUMPTIVE POSTIVE SARS-CoV-2 nucleic acids MAY BE PRESENT.   A presumptive positive result was obtained on the submitted specimen  and confirmed on repeat testing.  While 2019 novel coronavirus  (SARS-CoV-2) nucleic acids may be present in the submitted sample  additional confirmatory testing may be necessary for epidemiological  and / or clinical management purposes  to differentiate between  SARS-CoV-2 and other Sarbecovirus currently known to infect humans.  If clinically indicated additional testing with an alternate test  methodology [id number]) is advised. The SARS-CoV-2 RNA is generally  detectable in upper and lower respiratory sp ecimens during the acute  phase of infection. The expected result is Negative. Fact Sheet for Patients:  StrictlyIdeas.no Fact Sheet for Healthcare Providers: BankingDealers.co.za This test is not yet approved or cleared by the [place] FDA and has been authorized for detection and/or diagnosis of SARS-CoV-2 by  FDA under an Emergency Use Authorization (EUA).  This EUA will remain in effect (meaning this test can be used) for the duration of the COVID-19 declaration under Section 564(b)(1) of the Act, 21 U.S.C. section 360bbb-3(b)(1), unless the authorization is terminated or revoked sooner. Performed at [facility], [place] [address] [place], [place] [address]   Culture, blood (routine x 2)     Status: None   Collection Time: 04/10/19  2:04 PM  Result Value Ref Range Status   Specimen Description BLOOD RIGHT ANTECUBITAL  Final   Special Requests   Final    AEROBIC BOTTLE ONLY Blood Culture results may not be optimal due to an excessive volume of blood received in culture bottles   Culture   Final    NO GROWTH 5 DAYS Performed at [facility], [place] [address] [place], [place] [address]    Report Status 04/15/2019 FINAL  Final  MRSA PCR Screening     Status: None   Collection Time: 04/10/19  5:17 PM  Result Value Ref Range Status   MRSA by PCR NEGATIVE NEGATIVE Final    Comment:        The GeneXpert MRSA Assay (FDA approved for NASAL specimens only), is one component of a comprehensive MRSA colonization surveillance program. It is not intended to diagnose MRSA infection nor to guide or monitor treatment for MRSA infections. Performed at [facility], [place] [address], [place], [place] [address]   Culture, blood (routine x 2)     Status: None   Collection Time: 04/10/19  5:54 PM  Result Value Ref Range Status   Specimen Description BLOOD RIGHT HAND  Final   Special Requests   Final    BOTTLES DRAWN AEROBIC AND ANAEROBIC Blood Culture adequate volume   Culture   Final    NO GROWTH 5 DAYS Performed at [facility], [place] [address] [place], [place] [address]    Report Status 04/15/2019 FINAL  Final  Surgical pcr screen     Status: None   Collection  Time: 04/14/19  5:46 AM  Result Value Ref Range Status   MRSA, PCR NEGATIVE NEGATIVE Final   Staphylococcus  aureus NEGATIVE NEGATIVE Final    Comment: (NOTE) The Xpert SA Assay (FDA approved for NASAL specimens in patients 58 years of age and older), is one component of a comprehensive surveillance program. It is not intended to diagnose infection nor to guide or monitor treatment. Performed at [facility], [place] [address], [place], [place] [address]      Studies: No results found.  Scheduled Meds: . apixaban  2.5 mg Oral BID  . atorvastatin  80 mg Oral QPM  . Chlorhexidine Gluconate Cloth  6 each Topical Q0600  . cinacalcet  30 mg Oral Q T,Th,Sat-1800  . doxercalciferol  1 mcg Intravenous Q T,Th,Sa-HD  . insulin aspart  0-15 Units Subcutaneous TID WC  . insulin aspart  0-5 Units Subcutaneous QHS  . insulin detemir  18 Units Subcutaneous Daily  . lidocaine-prilocaine  1 application Topical See admin instructions  . metoprolol tartrate  12.5 mg Oral 2 times per day on Sun Mon Wed Fri  . midodrine  10 mg Oral Once per day on Mon Tue Thu Sat  . midodrine  2.5 mg Oral TID WC  . mometasone-formoterol  2 puff Inhalation BID  . pantoprazole  40 mg Oral QPM  . sevelamer carbonate  2,400 mg Oral TID WC  . sodium chloride flush  3 mL Intravenous Q12H    Continuous Infusions:    Jeffrey Lipps, MD  [facility] 04/16/2019

## 2019-04-17 ENCOUNTER — Inpatient Hospital Stay (HOSPITAL_COMMUNITY): Payer: Medicare Other

## 2019-04-17 LAB — GLUCOSE, CAPILLARY
Glucose-Capillary: 157 mg/dL — ABNORMAL HIGH (ref 70–99)
Glucose-Capillary: 170 mg/dL — ABNORMAL HIGH (ref 70–99)
Glucose-Capillary: 165 mg/dL — ABNORMAL HIGH (ref 70–99)
Glucose-Capillary: 125 mg/dL — ABNORMAL HIGH (ref 70–99)

## 2019-04-17 MED ORDER — HYDROMORPHONE HCL 1 MG/ML IJ SOLN
0.5000 mg | INTRAMUSCULAR | Status: DC | PRN
  Administered 2019-04-18: 0.5 mg via INTRAVENOUS

## 2019-04-17 MED ORDER — CHLORHEXIDINE GLUCONATE CLOTH 2 % EX PADS
6.0000 | MEDICATED_PAD | Freq: Every day | CUTANEOUS | Status: DC
  Administered 2019-04-17: 6 via TOPICAL

## 2019-04-17 MED ORDER — OXYCODONE-ACETAMINOPHEN 5-325 MG PO TABS
1.0000 | ORAL_TABLET | ORAL | Status: DC | PRN
  Administered 2019-04-18: 2 via ORAL
  Filled 2019-04-17: qty 2

## 2019-04-17 MED ORDER — CLOPIDOGREL BISULFATE 75 MG PO TABS
75.0000 mg | ORAL_TABLET | Freq: Every day | ORAL | Status: DC
  Administered 2019-04-17 – 2019-04-18 (×2): 75 mg via ORAL
  Filled 2019-04-17 (×2): qty 1

## 2019-04-17 MED ORDER — DARBEPOETIN ALFA 60 MCG/0.3ML IJ SOSY
60.0000 ug | PREFILLED_SYRINGE | INTRAMUSCULAR | Status: DC
  Administered 2019-04-18: 17:00:00 60 ug via INTRAVENOUS
  Filled 2019-04-17: qty 0.3

## 2019-04-17 NOTE — Progress Notes (Cosign Needed)
[facility] Progress Note   Subjective:  C/o leg pain  Objective Vitals:   04/16/19 1850 04/16/19 2243 04/17/19 0632 04/17/19 0855  BP: (!) 89/39 (!) 157/129 (!) 102/51 (!) 88/44  Pulse: (!) 55 (!) 55 (!) 58 (!) 54  Resp: 12 19 18 16   Temp: 97.6 F (36.4 C) 97.6 F (36.4 C) 98.8 F (37.1 C) 97.6 F (36.4 C)  TempSrc: Oral Oral Oral Oral  SpO2: 96% 100% 98% 100%  Weight: 94.6 kg 95 kg    Height:       Physical Exam General: Well appearing man, NAD. Wearing nasal oxygen (chronic at home too) Heart:bradycarida - 50s  Lungs: CTA anteriorly Abdomen: soft, non-tender Extremities: Trace RLE edema; L BKA bandaged Dialysis Access: L AVF + bruit  Dialysis Orders: [facility]  4h    F200   400/800    97.5kg   P4   LUE AVF   Hep none Hectorol 1 mcg TIW  Assessment/ Plan: 78 y.o. male with ESRD who was admitted on 04/10/2019 with L foot gangrene.  1. L foot gangrene/osteomyelitis: BCx negative, on Vanc/Ceftaz. 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 Pain control per primary. 2. ESRD: On HD 4x/week for volume (MTTS), had HD Mon and Wed Thursday - next HD Sat no heparin HD  3. HypoTN/volume: Chronic hypotension. On midodrine for BP support. Will need lower edw by 2-3kg after BKA.  Keep SBP > 80 - net UF 300 Wed 2.5 L Thursday - post wt 94.6 - will have lower edw for d/c due to amputation.- fluid intake issue not as a great an issue in hospital  4. Anemia: Hb declining post op  9.5  - resume ESA ARanesp 60 5. Metabolic bone disease: Continue  Hectorol and Sensipar/Renvela binder.  6. Afib: S/p PM. On Eliquis metoprolol. Holding Eliquis for surgery  7. DM Type 2: Hgb A1c 8.4%. Insulin per primary    Amalia Hailey, PA-C 04/17/2019, 11:22  AM       Additional Objective Labs: Basic Metabolic Panel: Recent Labs  Lab 04/14/19 0000 04/15/19 0418 04/16/19 0425  NA 136 133* 135  K 4.2 5.4* 3.9  CL 95* 94* 93*  CO2 28 20* 29  GLUCOSE 124* 170* 133*  BUN 17 36* 18   CREATININE 2.97* 4.20* 2.78*  CALCIUM 9.6 10.3 9.3   Liver Function Tests: Recent Labs  Lab 04/10/19 1305  AST 26  ALT 18  ALKPHOS 134*  BILITOT 1.8*  PROT 6.5  ALBUMIN 2.9*   CBC: Recent Labs  Lab 04/10/19 1400  04/11/19 2358 04/13/19 0332 04/14/19 0000 04/15/19 0418 04/16/19 0425  WBC 10.7*   < > 10.9* 9.7 10.9* 10.6* 9.7  NEUTROABS 7.9*  --   --   --   --   --   --   HGB 11.3*   < > 10.5* 11.3* 10.8* 10.4* 9.5*  HCT 36.2*   < > 32.9* 36.3* 34.4* 33.2* 29.7*  MCV 91.6   < > 90.6 91.7 92.0 91.2 90.5  PLT 215   < > 195 231 201 219 191   < > = values in this interval not displayed.   Blood Culture    Component Value Date/Time   SDES BLOOD RIGHT HAND 04/10/2019 1754   SPECREQUEST  04/10/2019 1754    BOTTLES DRAWN AEROBIC AND ANAEROBIC Blood Culture adequate volume   CULT  04/10/2019 1754    NO GROWTH 5 DAYS Performed at [facility], [place] [address],  [place], [place] [address]    REPTSTATUS 04/15/2019 FINAL 04/10/2019 1754   Cardiac Enzymes: Recent Labs  Lab 04/10/19 1305 04/11/19 0514  TROPONINI 0.63* 0.61*   CBG: Recent Labs  Lab 04/15/19 2319 04/16/19 0709 04/16/19 1124 04/16/19 2245 04/17/19 0659  GLUCAP 123* 119* 134* 182* 157*   Medications:  . apixaban  2.5 mg Oral BID  . atorvastatin  80 mg Oral QPM  . Chlorhexidine Gluconate Cloth  6 each Topical Q0600  . cinacalcet  30 mg Oral Q T,Th,Sat-1800  . clopidogrel  75 mg Oral Daily  . doxercalciferol  1 mcg Intravenous Q T,Th,Sa-HD  . insulin aspart  0-15 Units Subcutaneous TID WC  . insulin aspart  0-5 Units Subcutaneous QHS  . insulin detemir  18 Units Subcutaneous Daily  . lidocaine-prilocaine  1 application Topical See admin instructions  . metoprolol tartrate  12.5 mg Oral 2 times per day on Sun Mon Wed Fri  . midodrine  10 mg Oral Once per day on Mon Tue Thu Sat  . midodrine  2.5 mg Oral TID WC  . mometasone-formoterol  2 puff Inhalation BID  . pantoprazole  40 mg Oral QPM  .  sevelamer carbonate  2,400 mg Oral TID WC  . sodium chloride flush  3 mL Intravenous Q12H

## 2019-04-17 NOTE — Progress Notes (Signed)
PROGRESS NOTE  SHOWN DISSINGER EHU:[id number] DOB: 03-Mar-1941 DOA: 04/10/2019 PCP: Scot Jun, FNP   LOS: 7 days   Brief narrative:  Jeffrey [name] a 78 y.o.malewith medical history significant ofCHF,s/ppacemaker placement, s/pTAVR, A. fib on apixaban, CADs/pstent, ESRD onHD,DMtype 2, PVD,andanemia; who presented to the hospital with complaints of worsening left foot pain.Admitted in March for cellulitis of the same foot, but reported that has not been getting better. At this time, he reports that he is wheelchair-bound and his son and wife help him with transfers at home. He hadan abdominal aortogram along with thelower extremities performed by Dr. Scot Dock on 5/1. Based off of that study, he was thought to have reasonable flow to the left foot with real good options for revascularization. At that time, he felt that his toes were similarto nowwith dark appearance of the second,third,and fourth toe. He was advised to wash foot daily and keep it clean which he has done. However, over the  14 days prior to presentation, he developed progressively worsening pain.  On admission to the emergency department, patient was noted to be afebrile, pulse 51-60, respirations14-23, blood pressure 93/48-147/129, and O2 saturations maintained on 2 L nasal cannula oxygen. Labs revealed WBC 10.7, hemoglobin 11.3, sodium 132, potassium 5, BUN 26, creatinine 3.72, glucose 222, lactic acid 2.1 ->2.2,and troponin 0.63. X-rays revealed erosive osseous changes of the fourth digit left foot concerning for osteomyelitis. Blood cultures were obtained. Patient was given empiric antibiotics of vancomycin and Fortaz.  Subjective: Patient still complains of the pain at the amputation site..  Denies any nausea, vomiting or shortness of breath.  No fever chills or rigor.  Assessment/Plan:  Active Problems:   Diabetes type 2, controlled (HCC)   ESRD (end stage renal disease) on dialysis  (HCC)   Anemia of chronic disease   Automatic implantable cardioverter-defibrillator in situ   Chronic respiratory failure with hypoxia (HCC)   Troponin level elevated   Osteomyelitis (HCC)  Severe pain at the amputation site .  Status post left below-knee amputation.  Changed morphine to Dilaudid and Norco to oxycodone  yesterday but patient still complains of pain..  Will closely monitor.  Will adjust oral medications.  Sepsis likely secondary to left foot osteomyelitis with gangrene s/p Left BKA 5/19 by Dr. Scot Dock.  Patient was on vancomycin and Fortaz.  Currently off antibiotic since the source has been controlled and there is no bacteremia.  Continue physical therapy, occupational therapy.  Afebrile.  Blood cultures are negative in 4 days.Patient will need to follow-up with vascular surgery as outpatient in 4 to 6 weeks for staple removal.  Vascular surgery office to arrange for this follow-up.  Permanent atrial fibrillation status post PPM.  On Eliquis and metoprolol as outpatient. History of PPM placement.  Has been started on Eliquis.  ESRD on HD Continue hemodialysis Tuesday, Thursday, Saturday schedule. Nephrology on board.   Type 2 diabetes mellitus Patient's home regimen includes Levemir 28 units nightly.  Patient states insulin dose recently reduced by PCP.  Hemoglobin A1c 6.3 on 01/07/2019.   Hemoglobin A1c up to 8.4 this time.  On reduced dose of Levemir here in the hospital with sliding scale insulin.  Glycemic control is adequate.  Peripheral vascular disease Plavix currently on hold.  Will resume today.  Chronic hypotension. Continue Midrin.  Patient is asymptomatic  Anemia of chronic disease:  No active bleeding.  Will check hemoglobin in a.m.  COPD, without exacerbation, chronic respiratory failure with hypoxia On nasal cannula oxygen  at 2 L/min.  Continue nebulizers.  Aortic stenosiss/p TAVR:Stable  Anxiety --Continue Xanax as needed anxiety  VTE  Prophylaxis: Eliquis  Code Status: Full code  Family Communication: Updated the patient's wife yesterday.  Disposition Plan: Skilled nursing facility as per PT recommendations but the patient and the patient's wife want CIR as the first twice.  If patient would not qualify for CIR, would consider home with home health.  Spoke at the interdisciplinary meeting today.  Consultants:  Nephrology, vascular surgery   Procedures: Left BKA 5/19 by Dr. Scot Dock.   Antibiotics: None  Anti-infectives (From admission, onward)   Start     Dose/Rate Route Frequency Ordered Stop   04/15/19 1200  vancomycin (VANCOCIN) IVPB 1000 mg/200 mL premix  Status:  Discontinued     1,000 mg 200 mL/hr over 60 Minutes Intravenous Every Wed (Hemodialysis) 04/14/19 1317 04/15/19 1403   04/14/19 1317  vancomycin variable dose per unstable renal function (pharmacist dosing)  Status:  Discontinued      Does not apply See admin instructions 04/14/19 1317 04/15/19 1403   04/14/19 0000  ceFAZolin (ANCEF) IVPB 1 g/50 mL premix  Status:  Discontinued    Note to Pharmacy:  Send with pt to OR   1 g 100 mL/hr over 30 Minutes Intravenous On call 04/13/19 0758 04/13/19 0758   04/13/19 1200  vancomycin (VANCOCIN) IVPB 1000 mg/200 mL premix     1,000 mg 200 mL/hr over 60 Minutes Intravenous Every Mon (Hemodialysis) 04/12/19 1232 04/13/19 1233   04/11/19 2000  vancomycin (VANCOCIN) IVPB 1000 mg/200 mL premix  Status:  Discontinued     1,000 mg 200 mL/hr over 60 Minutes Intravenous Every T-Th-Sa (Hemodialysis) 04/10/19 1449 04/14/19 1317   04/11/19 2000  cefTAZidime (FORTAZ) 1 g in sodium chloride 0.9 % 100 mL IVPB  Status:  Discontinued     1 g 200 mL/hr over 30 Minutes Intravenous Every 24 hours 04/10/19 1621 04/15/19 1403   04/10/19 1500  vancomycin (VANCOCIN) 2,000 mg in sodium chloride 0.9 % 500 mL IVPB     2,000 mg 250 mL/hr over 120 Minutes Intravenous  Once 04/10/19 1449 04/10/19 1733   04/10/19 1445  cefTAZidime  (FORTAZ) 1 g in sodium chloride 0.9 % 100 mL IVPB     1 g 200 mL/hr over 30 Minutes Intravenous  Once 04/10/19 1431 04/10/19 1511     Objective: Vitals:   04/17/19 0632 04/17/19 0855  BP: (!) 102/51 (!) 88/44  Pulse: (!) 58 (!) 54  Resp: 18 16  Temp: 98.8 F (37.1 C) 97.6 F (36.4 C)  SpO2: 98% 100%    Intake/Output Summary (Last 24 hours) at 04/17/2019 1056 Last data filed at 04/17/2019 0852 Gross per 24 hour  Intake 360 ml  Output 2556 ml  Net -2196 ml   Filed Weights   04/16/19 1510 04/16/19 1850 04/16/19 2243  Weight: 97.5 kg 94.6 kg 95 kg   Body mass index is 29.21 kg/m.   Physical Exam: General:  Average built, not in obvious distress, on nasal cannula oxygen HENT: Normocephalic, pupils equally reacting to light and accommodation.  No scleral pallor or icterus noted. Oral mucosa is moist.  Chest:  Clear breath sounds.  Diminished breath sounds bilaterally. No crackles or wheezes.  CVS: S1 &S2 heard. No murmur.  Regular rate and rhythm. Abdomen: Soft, nontender, nondistended.  Bowel sounds are heard.  Liver is not palpable, no abdominal mass palpated Extremities: Left lower extremity below-knee amputation.  Ace wrap in place.  Trace right lower extremity edema.  Left upper extremity AV fistula with positive bruit Psych: Alert, awake and oriented, normal mood CNS:  No cranial nerve deficits.  Power equal in all extremities.  No sensory deficits noted.  No cerebellar signs.   Skin: Warm and dry.  Left BKA on Ace wrap  Data Review: I have personally reviewed the following laboratory data and studies,  CBC: Recent Labs  Lab 04/10/19 1400  04/11/19 2358 04/13/19 0332 04/14/19 0000 04/15/19 0418 04/16/19 0425  WBC 10.7*   < > 10.9* 9.7 10.9* 10.6* 9.7  NEUTROABS 7.9*  --   --   --   --   --   --   HGB 11.3*   < > 10.5* 11.3* 10.8* 10.4* 9.5*  HCT 36.2*   < > 32.9* 36.3* 34.4* 33.2* 29.7*  MCV 91.6   < > 90.6 91.7 92.0 91.2 90.5  PLT 215   < > 195 231 201 219 191    < > = values in this interval not displayed.   Basic Metabolic Panel: Recent Labs  Lab 04/11/19 2358 04/13/19 0332 04/14/19 0000 04/15/19 0418 04/16/19 0425  NA 134* 129* 136 133* 135  K 4.1 5.3* 4.2 5.4* 3.9  CL 93* 89* 95* 94* 93*  CO2 32 25 28 20* 29  GLUCOSE 114* 114* 124* 170* 133*  BUN 14 26* 17 36* 18  CREATININE 2.69* 4.08* 2.97* 4.20* 2.78*  CALCIUM 8.7* 9.3 9.6 10.3 9.3  MG  --   --   --   --  1.9   Liver Function Tests: Recent Labs  Lab 04/10/19 1305  AST 26  ALT 18  ALKPHOS 134*  BILITOT 1.8*  PROT 6.5  ALBUMIN 2.9*   No results for input(s): LIPASE, AMYLASE in the last 168 hours. No results for input(s): AMMONIA in the last 168 hours. Cardiac Enzymes: Recent Labs  Lab 04/10/19 1305 04/11/19 0514  TROPONINI 0.63* 0.61*   BNP (last 3 results) Recent Labs    07/27/18 0129 07/30/18 0955 11/29/18 1854  BNP 2,421.8* 3,146.0* 792.7*    ProBNP (last 3 results) No results for input(s): PROBNP in the last 8760 hours.  CBG: Recent Labs  Lab 04/15/19 2319 04/16/19 0709 04/16/19 1124 04/16/19 2245 04/17/19 0659  GLUCAP 123* 119* 134* 182* 157*   Recent Results (from the past 240 hour(s))  SARS Coronavirus 2 (CEPHEID - Performed in [place] hospital lab), Hosp Order     Status: None   Collection Time: 04/10/19 12:56 PM  Result Value Ref Range Status   SARS Coronavirus 2 NEGATIVE NEGATIVE Final    Comment: (NOTE) If result is NEGATIVE SARS-CoV-2 target nucleic acids are NOT DETECTED. The SARS-CoV-2 RNA is generally detectable in upper and lower  respiratory specimens during the acute phase of infection. The lowest  concentration of SARS-CoV-2 viral copies this assay can detect is 250  copies / mL. A negative result does not preclude SARS-CoV-2 infection  and should not be used as the sole basis for treatment or other  patient management decisions.  A negative result may occur with  improper specimen collection / handling, submission of  specimen other  than nasopharyngeal swab, presence of viral mutation(s) within the  areas targeted by this assay, and inadequate number of viral copies  (<250 copies / mL). A negative result must be combined with clinical  observations, patient history, and epidemiological information. If result is POSITIVE SARS-CoV-2 target nucleic acids are DETECTED. The SARS-CoV-2 RNA is  generally detectable in upper and lower  respiratory specimens dur ing the acute phase of infection.  Positive  results are indicative of active infection with SARS-CoV-2.  Clinical  correlation with patient history and other diagnostic information is  necessary to determine patient infection status.  Positive results do  not rule out bacterial infection or co-infection with other viruses. If result is PRESUMPTIVE POSTIVE SARS-CoV-2 nucleic acids MAY BE PRESENT.   A presumptive positive result was obtained on the submitted specimen  and confirmed on repeat testing.  While 2019 novel coronavirus  (SARS-CoV-2) nucleic acids may be present in the submitted sample  additional confirmatory testing may be necessary for epidemiological  and / or clinical management purposes  to differentiate between  SARS-CoV-2 and other Sarbecovirus currently known to infect humans.  If clinically indicated additional testing with an alternate test  methodology [phone]) is advised. The SARS-CoV-2 RNA is generally  detectable in upper and lower respiratory sp ecimens during the acute  phase of infection. The expected result is Negative. Fact Sheet for Patients:  StrictlyIdeas.no Fact Sheet for Healthcare Providers: BankingDealers.co.za This test is not yet approved or cleared by the [place] FDA and has been authorized for detection and/or diagnosis of SARS-CoV-2 by FDA under an Emergency Use Authorization (EUA).  This EUA will remain in effect (meaning this test can be used) for the  duration of the COVID-19 declaration under Section 564(b)(1) of the Act, 21 U.S.C. section 360bbb-3(b)(1), unless the authorization is terminated or revoked sooner. Performed at [facility], [place] [address], [place], [place] [address]   Culture, blood (routine x 2)     Status: None   Collection Time: 04/10/19  2:04 PM  Result Value Ref Range Status   Specimen Description BLOOD RIGHT ANTECUBITAL  Final   Special Requests   Final    AEROBIC BOTTLE ONLY Blood Culture results may not be optimal due to an excessive volume of blood received in culture bottles   Culture   Final    NO GROWTH 5 DAYS Performed at [facility], [place] [address] [place], [place] [address]    Report Status 04/15/2019 FINAL  Final  MRSA PCR Screening     Status: None   Collection Time: 04/10/19  5:17 PM  Result Value Ref Range Status   MRSA by PCR NEGATIVE NEGATIVE Final    Comment:        The GeneXpert MRSA Assay (FDA approved for NASAL specimens only), is one component of a comprehensive MRSA colonization surveillance program. It is not intended to diagnose MRSA infection nor to guide or monitor treatment for MRSA infections. Performed at [facility], [place] [address], [place], [place] [address]   Culture, blood (routine x 2)     Status: None   Collection Time: 04/10/19  5:54 PM  Result Value Ref Range Status   Specimen Description BLOOD RIGHT HAND  Final   Special Requests   Final    BOTTLES DRAWN AEROBIC AND ANAEROBIC Blood Culture adequate volume   Culture   Final    NO GROWTH 5 DAYS Performed at [facility], [place] [address], [place], [place] [address]    Report Status 04/15/2019 FINAL  Final  Surgical pcr screen     Status: None   Collection Time: 04/14/19  5:46 AM  Result Value Ref Range Status   MRSA, PCR NEGATIVE NEGATIVE Final   Staphylococcus aureus NEGATIVE NEGATIVE Final    Comment: (NOTE)  The Xpert SA Assay (FDA approved for NASAL specimens in patients 55  years of age and older), is one component of a comprehensive surveillance program. It is not intended to diagnose infection nor to guide or monitor treatment. Performed at [facility], [place] [address] [place], [place] [address]      Studies: No results found.  Scheduled Meds: . apixaban  2.5 mg Oral BID  . atorvastatin  80 mg Oral QPM  . Chlorhexidine Gluconate Cloth  6 each Topical Q0600  . cinacalcet  30 mg Oral Q T,Th,Sat-1800  . doxercalciferol  1 mcg Intravenous Q T,Th,Sa-HD  . insulin aspart  0-15 Units Subcutaneous TID WC  . insulin aspart  0-5 Units Subcutaneous QHS  . insulin detemir  18 Units Subcutaneous Daily  . lidocaine-prilocaine  1 application Topical See admin instructions  . metoprolol tartrate  12.5 mg Oral 2 times per day on Sun Mon Wed Fri  . midodrine  10 mg Oral Once per day on Mon Tue Thu Sat  . midodrine  2.5 mg Oral TID WC  . mometasone-formoterol  2 puff Inhalation BID  . pantoprazole  40 mg Oral QPM  . sevelamer carbonate  2,400 mg Oral TID WC  . sodium chloride flush  3 mL Intravenous Q12H    Continuous Infusions:    Flora Lipps, MD  [facility] 04/17/2019

## 2019-04-17 NOTE — Progress Notes (Signed)
Physical Therapy Treatment Patient Details Name: Jeffrey [name] MRN: [id number] DOB: June 17, 1941 Today's Date: 04/17/2019    History of Present Illness Jeffrey [name] is a 78 y.o. male with history of CAD status post stenting, ESRD on hemodialysis Tuesday Thursday Saturday, diabetes mellitus type 2, anemia, CHF, pacemaker placement, status post TAVR, A. fib on apixaban presents to the ER with complaints of increasing swelling of the left lower extremity over the last 4 days with discoloration of the middle 3 toes of the left foot over the last 2 days.     PT Comments    Pt agreeable to therapy, cont to work on bed mobility, lateral scooting in bed. Pt with limited tolerance and fatigues quickly. Cont to rec SNF.     Follow Up Recommendations  SNF;Supervision/Assistance - 24 hour     Equipment Recommendations  None recommended by PT    Recommendations for Other Services       Precautions / Restrictions Precautions Precautions: Fall Restrictions Weight Bearing Restrictions: No    Mobility  Bed Mobility Overal bed mobility: Needs Assistance Bed Mobility: Rolling;Sidelying to Sit;Sit to Sidelying Rolling: Mod assist Sidelying to sit: HOB elevated;Mod assist     Sit to sidelying: HOB elevated;Mod assist General bed mobility comments: mod A of 2 to assist up, cues for using bed rails with BUE, support of trunk.   Transfers Overall transfer level: Needs assistance Equipment used: Rolling walker (2 wheeled) Transfers: Sit to/from Stand Sit to Stand: Mod assist         General transfer comment: defered lateral or squat pivot transfer to chair due to patients fatigue. patient with lateral scooting on slight decline EOB for majority of session. Mod A needed towards end due to fatigue.   Ambulation/Gait                 Stairs             Wheelchair Mobility    Modified Rankin (Stroke Patients Only)       Balance Overall balance assessment: Needs  assistance Sitting-balance support: Feet supported Sitting balance-Leahy Scale: Fair Sitting balance - Comments: Able to sit with close min guard -Mod A for sitting balance; decreased arousal at times. Postural control: Right lateral lean   Standing balance-Leahy Scale: Poor                              Cognition Arousal/Alertness: Awake/alert Behavior During Therapy: WFL for tasks assessed/performed Overall Cognitive Status: Within Functional Limits for tasks assessed                                 General Comments: for simple mobility;pt verbalized understanding of phantom limb pain. reports no feelings to date      Exercises      General Comments        Pertinent Vitals/Pain Pain Assessment: Faces Faces Pain Scale: Hurts little more Pain Location: left residual limb Pain Descriptors / Indicators: Burning;Sore Pain Intervention(s): Limited activity within patient's tolerance;Monitored during session;Repositioned    Home Living                      Prior Function            PT Goals (current goals can now be found in the care plan section) Acute Rehab PT Goals Patient Stated Goal: to  stand up  PT Goal Formulation: With patient Potential to Achieve Goals: Good Progress towards PT goals: Progressing toward goals    Frequency    Min 3X/week      PT Plan Current plan remains appropriate    Co-evaluation PT/OT/SLP Co-Evaluation/Treatment: Yes Reason for Co-Treatment: For patient/therapist safety PT goals addressed during session: Balance;Mobility/safety with mobility;Strengthening/ROM        AM-PAC PT "6 Clicks" Mobility   Outcome Measure  Help needed turning from your back to your side while in a flat bed without using bedrails?: None Help needed moving from lying on your back to sitting on the side of a flat bed without using bedrails?: A Little Help needed moving to and from a bed to a chair (including a  wheelchair)?: A Little Help needed standing up from a chair using your arms (e.g., wheelchair or bedside chair)?: A Little Help needed to walk in hospital room?: A Little Help needed climbing 3-5 steps with a railing? : A Lot 6 Click Score: 18    End of Session Equipment Utilized During Treatment: Gait belt Activity Tolerance: Patient limited by fatigue Patient left: in chair;with call bell/phone within reach;with chair alarm set Nurse Communication: Mobility status PT Visit Diagnosis: Unsteadiness on feet (R26.81);Other abnormalities of gait and mobility (R26.89) Pain - Right/Left: Left Pain - part of body: Leg     Time: 2620-3559 PT Time Calculation (min) (ACUTE ONLY): 23 min  Charges:  $Therapeutic Activity: 8-22 mins                    Reinaldo Berber, PT, DPT Acute Rehabilitation Services Pager: [phone] Office: [place] 04/17/2019, 10:48 AM

## 2019-04-17 NOTE — Progress Notes (Signed)
Occupational Therapy Treatment Patient Details Name: Jeffrey [name] MRN: [id number] DOB: [birth year] Today's Date: 04/17/2019    History of present illness Jeffrey [name] is a 78 y.o. male with history of CAD status post stenting, ESRD on hemodialysis Tuesday Thursday Saturday, diabetes mellitus type 2, anemia, CHF, pacemaker placement, status post TAVR, A. fib on apixaban presents to the ER with complaints of increasing swelling of the left lower extremity over the last 4 days with discoloration of the middle 3 toes of the left foot over the last 2 days.    OT comments  Pt. Seen for skilled therapy session with PT/OT.  Focus of session was bed mobility in preparation for increasing functional mobility when pt. Ready to attempt sit/stand. Pt. Limited by c/o fatigue.  Max encouragement for participation and completion of task ie; scooting towards [place].   Follow Up Recommendations  SNF;Supervision/Assistance - 24 hour    Equipment Recommendations  3 in 1 bedside commode    Recommendations for Other Services      Precautions / Restrictions Precautions Precautions: Fall       Mobility Bed Mobility Overal bed mobility: Needs Assistance Bed Mobility: Rolling;Sidelying to Sit;Sit to Sidelying Rolling: Mod assist;+2 for physical assistance Sidelying to sit: Max assist;HOB elevated;+2 for physical assistance;Mod assist     Sit to sidelying: Max assist General bed mobility comments: max A of 2 to assist up, cues for using bed rails with BUE, support of trunk.   Transfers Overall transfer level: Needs assistance               General transfer comment: rolling left/right for increasing independence with bed mobility, in conjunction with lateral scoots towards hob in preparation for sit/stand. pt. limited by c/o fatigue    Balance                                           ADL either performed or assessed with clinical judgement   ADL Overall ADL's : Needs  assistance/impaired                                     Functional mobility during ADLs: +2 for physical assistance;Moderate assistance General ADL Comments: pt. requires assistance secondary to poor trunk control and pain from recent amputation     Vision       Perception     Praxis      Cognition Arousal/Alertness: Awake/alert Behavior During Therapy: WFL for tasks assessed/performed Overall Cognitive Status: Within Functional Limits for tasks assessed                                          Exercises     Shoulder Instructions       General Comments      Pertinent Vitals/ Pain       Pain Assessment: Faces Faces Pain Scale: Hurts little more Pain Location: left residual limb Pain Descriptors / Indicators: Aching Pain Intervention(s): Limited activity within patient's tolerance;Monitored during session;Repositioned  Home Living  Prior Functioning/Environment              Frequency  Min 2X/week        Progress Toward Goals  OT Goals(current goals can now be found in the care plan section)  Progress towards OT goals: Progressing toward goals     Plan      Co-evaluation    PT/OT/SLP Co-Evaluation/Treatment: Yes Reason for Co-Treatment: To address functional/ADL transfers;For patient/therapist safety PT goals addressed during session: Mobility/safety with mobility        AM-PAC OT "6 Clicks" Daily Activity     Outcome Measure   Help from another person eating meals?: None Help from another person taking care of personal grooming?: A Little Help from another person toileting, which includes using toliet, bedpan, or urinal?: A Lot Help from another person bathing (including washing, rinsing, drying)?: A Lot Help from another person to put on and taking off regular upper body clothing?: A Little Help from another person to put on and taking off regular  lower body clothing?: A Lot 6 Click Score: 16    End of Session    OT Visit Diagnosis: Unsteadiness on feet (R26.81);Other abnormalities of gait and mobility (R26.89);Muscle weakness (generalized) (M62.81);Pain Pain - Right/Left: Left Pain - part of body: Leg   Activity Tolerance Patient limited by fatigue   Patient Left in bed;with call bell/phone within reach;with bed alarm set   Nurse Communication          Time: 3383-2919 OT Time Calculation (min): 20 min  Charges: OT General Charges $OT Visit: 1 Visit OT Treatments $Self Care/Home Management : 8-22 mins  Janice Coffin, COTA/L 04/17/2019, 10:33 AM

## 2019-04-17 NOTE — Progress Notes (Signed)
Inpatient Rehabilitation Admissions Coordinator  Inpatient Rehab Consult received. I met with patient at the bedside for rehabilitation assessment. Patient not at a level for the intensity of an inpt rehab admission at this time. PT an OT both states needing max encouragement to participate as well as fatigues easily with poor tolerance. I recommend SNF rehab which patient states refusal. Wants to go home with [facility] and support of wife and 80 year old son. I have concerns to attend outpatient hemodialysis treatments at this level. I met with RN CM, Wendi, and she is aware. We will sign off at this time.  Danne Baxter, RN, MSN Rehab Admissions Coordinator [phone] 04/17/2019 11:28 AM

## 2019-04-17 NOTE — Progress Notes (Signed)
[facility] Progress Note   Subjective:  C/o leg pain  Objective Vitals:   04/16/19 1850 04/16/19 2243 04/17/19 0632 04/17/19 0855  BP: (!) 89/39 (!) 157/129 (!) 102/51 (!) 88/44  Pulse: (!) 55 (!) 55 (!) 58 (!) 54  Resp: 12 19 18 16   Temp: 97.6 F (36.4 C) 97.6 F (36.4 C) 98.8 F (37.1 C) 97.6 F (36.4 C)  TempSrc: Oral Oral Oral Oral  SpO2: 96% 100% 98% 100%  Weight: 94.6 kg 95 kg    Height:       Physical Exam General: Well appearing man, NAD. Wearing nasal oxygen (chronic at home too) Heart: bradycarida - 50s  Lungs: CTA anteriorly Abdomen: soft, non-tender Extremities: Trace RLE edema; L BKA bandaged Dialysis Access: L AVF + bruit  Dialysis Orders: [facility]  4h    F200   400/800    97.5kg   P4   LUE AVF   Hep none Hectorol 1 mcg TIW  Assessment/ Plan: 78 y.o. male with ESRD who was admitted on 04/10/2019 with L foot gangrene.  1. L foot gangrene/osteomyelitis: BCx negative, on Vanc/Ceftaz. 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 Pain control per primary. 2. ESRD: On HD 4x/week for volume (MTTS), had HD Mon and Wed Thursday - next HD Sat no heparin HD  3. HypoTN/volume: Chronic hypotension. On midodrine for BP support. Will need lower edw by 2-3kg after BKA.  Keep SBP > 80 - net UF 300 Wed 2.5 L Thursday - post wt 94.6 - will have lower edw for d/c due to amputation.- fluid intake issue not as a great an issue in hospital  4. Anemia: Hb declining post op  9.5  - resume ESA ARanesp 60 5. Metabolic bone disease: Continue  Hectorol and Sensipar/Renvela binder.  6. Afib: S/p PM. On Eliquis metoprolol. Holding Eliquis for surgery  7. DM Type 2: Hgb A1c 8.4%. Insulin per primary    Amalia Hailey, PA-C 04/17/2019, 11:22  AM  Pt seen, examined and agree w A/P as above.  Kelly Splinter  MD 04/17/2019, 12:57 PM        Additional Objective Labs: Basic Metabolic Panel: Recent Labs  Lab 04/14/19 0000 04/15/19 0418 04/16/19 0425  NA 136 133* 135  K 4.2  5.4* 3.9  CL 95* 94* 93*  CO2 28 20* 29  GLUCOSE 124* 170* 133*  BUN 17 36* 18  CREATININE 2.97* 4.20* 2.78*  CALCIUM 9.6 10.3 9.3   Liver Function Tests: Recent Labs  Lab 04/10/19 1305  AST 26  ALT 18  ALKPHOS 134*  BILITOT 1.8*  PROT 6.5  ALBUMIN 2.9*   CBC: Recent Labs  Lab 04/10/19 1400  04/11/19 2358 04/13/19 0332 04/14/19 0000 04/15/19 0418 04/16/19 0425  WBC 10.7*   < > 10.9* 9.7 10.9* 10.6* 9.7  NEUTROABS 7.9*  --   --   --   --   --   --   HGB 11.3*   < > 10.5* 11.3* 10.8* 10.4* 9.5*  HCT 36.2*   < > 32.9* 36.3* 34.4* 33.2* 29.7*  MCV 91.6   < > 90.6 91.7 92.0 91.2 90.5  PLT 215   < > 195 231 201 219 191   < > = values in this interval not displayed.   Blood Culture    Component Value Date/Time   SDES BLOOD RIGHT HAND 04/10/2019 1754   SPECREQUEST  04/10/2019 1754    BOTTLES DRAWN AEROBIC AND ANAEROBIC Blood Culture adequate volume   CULT  04/10/2019 1754    NO GROWTH 5 DAYS Performed at [facility], [place] [address] [place], [place] [address]    REPTSTATUS 04/15/2019 FINAL 04/10/2019 1754   Cardiac Enzymes: Recent Labs  Lab 04/10/19 1305 04/11/19 0514  TROPONINI 0.63* 0.61*   CBG: Recent Labs  Lab 04/16/19 0709 04/16/19 1124 04/16/19 2245 04/17/19 0659 04/17/19 1132  GLUCAP 119* 134* 182* 157* 170*   Medications:  . apixaban  2.5 mg Oral BID  . atorvastatin  80 mg Oral QPM  . Chlorhexidine Gluconate Cloth  6 each Topical Q0600  . cinacalcet  30 mg Oral Q T,Th,Sat-1800  . clopidogrel  75 mg Oral Daily  . [START ON 04/18/2019] darbepoetin (ARANESP) injection - DIALYSIS  60 mcg Intravenous Q Sat-HD  . doxercalciferol  1 mcg Intravenous Q T,Th,Sa-HD  . insulin aspart  0-15 Units Subcutaneous TID WC  . insulin aspart  0-5 Units Subcutaneous QHS  . insulin detemir  18 Units Subcutaneous Daily  . lidocaine-prilocaine  1 application Topical See admin instructions  . metoprolol tartrate  12.5 mg Oral 2 times per day on Sun Mon Wed  Fri  . midodrine  10 mg Oral Once per day on Mon Tue Thu Sat  . midodrine  2.5 mg Oral TID WC  . mometasone-formoterol  2 puff Inhalation BID  . pantoprazole  40 mg Oral QPM  . sevelamer carbonate  2,400 mg Oral TID WC  . sodium chloride flush  3 mL Intravenous Q12H

## 2019-04-17 NOTE — Progress Notes (Signed)
   VASCULAR SURGERY ASSESSMENT & PLAN:   POD 3: Left BKA - His left below the knee amputation site is healing nicely.  He is not straightening his knee completely and I have instructed him that he needs to be able to straighten his knee to get a prosthesis.  This also puts him at higher risk for getting a pressure sore.  This reason I have ordered a knee immobilizer.  SUBJECTIVE:   Has been confused at times but currently is oriented.  PHYSICAL EXAM:   Vitals:   04/16/19 1850 04/16/19 2243 04/17/19 0632 04/17/19 0855  BP: (!) 89/39 (!) 157/129 (!) 102/51 (!) 88/44  Pulse: (!) 55 (!) 55 (!) 58 (!) 54  Resp: 12 19 18 16   Temp: 97.6 F (36.4 C) 97.6 F (36.4 C) 98.8 F (37.1 C) 97.6 F (36.4 C)  TempSrc: Oral Oral Oral Oral  SpO2: 96% 100% 98% 100%  Weight: 94.6 kg 95 kg    Height:       I changed his dressing this morning.  His left below the knee amputation site is healing nicely. He does have a slight knee contracture.  LABS:   Lab Results  Component Value Date   WBC 9.7 04/16/2019   HGB 9.5 (L) 04/16/2019   HCT 29.7 (L) 04/16/2019   MCV 90.5 04/16/2019   PLT 191 04/16/2019   Lab Results  Component Value Date   CREATININE 2.78 (H) 04/16/2019   Lab Results  Component Value Date   INR 1.7 (H) 04/13/2019   CBG (last 3)  Recent Labs    04/16/19 1124 04/16/19 2245 04/17/19 0659  GLUCAP 134* 182* 157*    PROBLEM LIST:    Active Problems:   Diabetes type 2, controlled (HCC)   ESRD (end stage renal disease) on dialysis (HCC)   Anemia of chronic disease   Automatic implantable cardioverter-defibrillator in situ   Chronic respiratory failure with hypoxia (HCC)   Troponin level elevated   Osteomyelitis (HCC)   CURRENT MEDS:   . apixaban  2.5 mg Oral BID  . atorvastatin  80 mg Oral QPM  . Chlorhexidine Gluconate Cloth  6 each Topical Q0600  . cinacalcet  30 mg Oral Q T,Th,Sat-1800  . doxercalciferol  1 mcg Intravenous Q T,Th,Sa-HD  . insulin aspart  0-15  Units Subcutaneous TID WC  . insulin aspart  0-5 Units Subcutaneous QHS  . insulin detemir  18 Units Subcutaneous Daily  . lidocaine-prilocaine  1 application Topical See admin instructions  . metoprolol tartrate  12.5 mg Oral 2 times per day on Sun Mon Wed Fri  . midodrine  10 mg Oral Once per day on Mon Tue Thu Sat  . midodrine  2.5 mg Oral TID WC  . mometasone-formoterol  2 puff Inhalation BID  . pantoprazole  40 mg Oral QPM  . sevelamer carbonate  2,400 mg Oral TID WC  . sodium chloride flush  3 mL Intravenous Q12H    [name] Beeper: [phone] Office: [phone] 04/17/2019'

## 2019-04-17 NOTE — Progress Notes (Signed)
Mr. Binion daughter called and stated, that her father is to not sign anything without her or his wife's permission.   Farley Ly RN

## 2019-04-17 NOTE — Care Management Important Message (Signed)
Important Message  Patient Details  Name: Jeffrey [name] MRN: [id number] Date of Birth: Mar 15, 1941   Medicare Important Message Given:  Yes    [name] 04/17/2019, 3:10 PM

## 2019-04-18 ENCOUNTER — Emergency Department (HOSPITAL_COMMUNITY)
Admission: EM | Admit: 2019-04-18 | Discharge: 2019-04-19 | Disposition: A | Discharge status: Home / Self Care | Payer: Medicare Other | Attending: Emergency Medicine | Admitting: Emergency Medicine

## 2019-04-18 ENCOUNTER — Encounter (HOSPITAL_COMMUNITY): Payer: Self-pay

## 2019-04-18 DIAGNOSIS — I739 Peripheral vascular disease, unspecified: Secondary | ICD-10-CM | POA: Insufficient documentation

## 2019-04-18 DIAGNOSIS — I5032 Chronic diastolic (congestive) heart failure: Secondary | ICD-10-CM | POA: Insufficient documentation

## 2019-04-18 DIAGNOSIS — Z992 Dependence on renal dialysis: Secondary | ICD-10-CM | POA: Insufficient documentation

## 2019-04-18 DIAGNOSIS — I4891 Unspecified atrial fibrillation: Secondary | ICD-10-CM | POA: Insufficient documentation

## 2019-04-18 DIAGNOSIS — Z79899 Other long term (current) drug therapy: Secondary | ICD-10-CM | POA: Insufficient documentation

## 2019-04-18 DIAGNOSIS — Z87891 Personal history of nicotine dependence: Secondary | ICD-10-CM | POA: Insufficient documentation

## 2019-04-18 DIAGNOSIS — N186 End stage renal disease: Secondary | ICD-10-CM | POA: Insufficient documentation

## 2019-04-18 DIAGNOSIS — I252 Old myocardial infarction: Secondary | ICD-10-CM | POA: Insufficient documentation

## 2019-04-18 DIAGNOSIS — Z951 Presence of aortocoronary bypass graft: Secondary | ICD-10-CM | POA: Insufficient documentation

## 2019-04-18 DIAGNOSIS — E1122 Type 2 diabetes mellitus with diabetic chronic kidney disease: Secondary | ICD-10-CM | POA: Insufficient documentation

## 2019-04-18 DIAGNOSIS — K5903 Drug induced constipation: Secondary | ICD-10-CM | POA: Insufficient documentation

## 2019-04-18 DIAGNOSIS — I5084 End stage heart failure: Secondary | ICD-10-CM | POA: Insufficient documentation

## 2019-04-18 DIAGNOSIS — I132 Hypertensive heart and chronic kidney disease with heart failure and with stage 5 chronic kidney disease, or end stage renal disease: Secondary | ICD-10-CM | POA: Insufficient documentation

## 2019-04-18 LAB — GLUCOSE, CAPILLARY
Glucose-Capillary: 183 mg/dL — ABNORMAL HIGH (ref 70–99)
Glucose-Capillary: 100 mg/dL — ABNORMAL HIGH (ref 70–99)

## 2019-04-18 LAB — MAGNESIUM: Magnesium: 2 mg/dL (ref 1.7–2.4)

## 2019-04-18 LAB — BASIC METABOLIC PANEL
Anion gap: 11 (ref 5–15)
BUN: 24 mg/dL — ABNORMAL HIGH (ref 8–23)
CO2: 28 mmol/L (ref 22–32)
Calcium: 9.8 mg/dL (ref 8.9–10.3)
Chloride: 95 mmol/L — ABNORMAL LOW (ref 98–111)
Creatinine, Ser: 3.76 mg/dL — ABNORMAL HIGH (ref 0.61–1.24)
GFR calc Af Amer: 17 mL/min — ABNORMAL LOW (ref >60)
GFR calc non Af Amer: 14 mL/min — ABNORMAL LOW (ref >60)
Glucose, Bld: 163 mg/dL — ABNORMAL HIGH (ref 70–99)
Potassium: 4.3 mmol/L (ref 3.5–5.1)
Sodium: 134 mmol/L — ABNORMAL LOW (ref 135–145)

## 2019-04-18 LAB — CBC
HCT: 32.9 % — ABNORMAL LOW (ref 39.0–52.0)
Hemoglobin: 10.3 g/dL — ABNORMAL LOW (ref 13.0–17.0)
MCH: 28.9 pg (ref 26.0–34.0)
MCHC: 31.3 g/dL (ref 30.0–36.0)
MCV: 92.4 fL (ref 80.0–100.0)
Platelets: 172 10*3/uL (ref 150–400)
RBC: 3.56 MIL/uL — ABNORMAL LOW (ref 4.22–5.81)
RDW: 17.1 % — ABNORMAL HIGH (ref 11.5–15.5)
WBC: 9.9 10*3/uL (ref 4.0–10.5)
nRBC: 1.2 % — ABNORMAL HIGH (ref 0.0–0.2)

## 2019-04-18 MED ORDER — DOCUSATE SODIUM 100 MG PO CAPS: 100.0000 mg | ORAL_CAPSULE | Freq: Every day | ORAL | 2 refills | Status: DC | PRN

## 2019-04-18 MED ORDER — DOXERCALCIFEROL 4 MCG/2ML IV SOLN
INTRAVENOUS | Status: AC
  Administered 2019-04-18: 1 ug via INTRAVENOUS
  Filled 2019-04-18: qty 2

## 2019-04-18 MED ORDER — OXYCODONE-ACETAMINOPHEN 5-325 MG PO TABS: 1.0000 | ORAL_TABLET | Freq: Four times a day (QID) | ORAL | 0 refills | Status: AC | PRN

## 2019-04-18 MED ORDER — ACETAMINOPHEN 500 MG PO TABS: 500.0000 mg | ORAL_TABLET | Freq: Four times a day (QID) | ORAL | Status: AC | PRN

## 2019-04-18 MED ORDER — INSULIN DETEMIR 100 UNIT/ML ~~LOC~~ SOLN: 20.0000 [IU] | Freq: Every day | SUBCUTANEOUS | Status: DC

## 2019-04-18 MED ORDER — HYDROMORPHONE HCL 1 MG/ML IJ SOLN
INTRAMUSCULAR | Status: AC
  Administered 2019-04-18: 0.5 mg via INTRAVENOUS
  Filled 2019-04-18: qty 0.5

## 2019-04-18 MED ORDER — DARBEPOETIN ALFA 60 MCG/0.3ML IJ SOSY
PREFILLED_SYRINGE | INTRAMUSCULAR | Status: AC
  Administered 2019-04-18: 60 ug via INTRAVENOUS
  Filled 2019-04-18: qty 0.3

## 2019-04-18 NOTE — Progress Notes (Signed)
   VASCULAR SURGERY ASSESSMENT & PLAN:   POD 4 - L BKA:  I looked at the wound yesterday and so far looks good.  I have written for daily dressing changes.  He is doing a much better job of keeping the knee straight today.  Not a candidate for CIR. SNF recommended which he refuses.  He wants to go home with physical therapy at home.  ESRD: He does dialysis 4 times a week because of volume issues. (MTTS).  ANTICOAGULATION: He is on Eliquis for atrial fibrillation.  SUBJECTIVE:   No complaints this morning.  He wants to go home.  He wants to do therapy at home.  PHYSICAL EXAM:   Vitals:   04/17/19 2104 04/18/19 0429 04/18/19 0601 04/18/19 0741  BP: (!) 99/53 (!) 83/48 (!) 116/53   Pulse: (!) 55 64 (!) 55   Resp:      Temp: 97.6 F (36.4 C) (!) 97.5 F (36.4 C)    TempSrc: Oral Oral    SpO2: 98% 91% 100% 100%  Weight: 103.9 kg     Height:       Dressing on his left BKA is dry.  LABS:   Lab Results  Component Value Date   WBC 9.9 04/18/2019   HGB 10.3 (L) 04/18/2019   HCT 32.9 (L) 04/18/2019   MCV 92.4 04/18/2019   PLT 172 04/18/2019   Lab Results  Component Value Date   CREATININE 3.76 (H) 04/18/2019   Lab Results  Component Value Date   INR 1.7 (H) 04/13/2019   CBG (last 3)  Recent Labs    04/17/19 1643 04/17/19 2129 04/18/19 0655  GLUCAP 165* 125* 183*    PROBLEM LIST:    Active Problems:   Diabetes type 2, controlled ([facility])   ESRD (end stage renal disease) on dialysis (HCC)   Anemia of chronic disease   Automatic implantable cardioverter-defibrillator in situ   Chronic respiratory failure with hypoxia (HCC)   Troponin level elevated   Osteomyelitis (HCC)   CURRENT MEDS:   . apixaban  2.5 mg Oral BID  . atorvastatin  80 mg Oral QPM  . Chlorhexidine Gluconate Cloth  6 each Topical Q0600  . cinacalcet  30 mg Oral Q T,Th,Sat-1800  . clopidogrel  75 mg Oral Daily  . darbepoetin (ARANESP) injection - DIALYSIS  60 mcg Intravenous Q Sat-HD  .  doxercalciferol  1 mcg Intravenous Q T,Th,Sa-HD  . insulin aspart  0-15 Units Subcutaneous TID WC  . insulin aspart  0-5 Units Subcutaneous QHS  . insulin detemir  18 Units Subcutaneous Daily  . lidocaine-prilocaine  1 application Topical See admin instructions  . metoprolol tartrate  12.5 mg Oral 2 times per day on Sun Mon Wed Fri  . midodrine  10 mg Oral Once per day on Mon Tue Thu Sat  . midodrine  2.5 mg Oral TID WC  . mometasone-formoterol  2 puff Inhalation BID  . pantoprazole  40 mg Oral QPM  . sevelamer carbonate  2,400 mg Oral TID WC  . sodium chloride flush  3 mL Intravenous Q12H    [name] Beeper: [phone] Office: [phone] 04/18/2019

## 2019-04-18 NOTE — TOC Transition Note (Signed)
Transition of Care [facility]) - CM/SW Discharge Note   Patient Details  Name: ONEILL BAIS MRN: [id number] Date of Birth: 04-Mar-1941  [facility] CM/SW Contact:  Carles Collet, RN Phone Number: 04/18/2019, 11:49 AM   Clinical Narrative:   Damaris Schooner w patient who deferred to wife. She states they would like to DC to home. Confirms DME from previous note. Orders placed to 3/1 w drop arm and hoyer, requested [employer] to deliver to house. Wife states patient active w [facility]. Notified Corene Cornea of DC plan for today. Daughter will transport home and CM verified she will bring home O2 for transport and that they will be able to transport from car to inside home vis home [facility] and home ramps to get in. No other CM needs identified.     Final next level of care: [place] Barriers to Discharge: No Barriers Identified   Patient Goals and CMS Choice Patient states their goals for this hospitalization and ongoing recovery are:: to return home CMS Medicare.gov Compare Post Acute Care list provided to:: Patient Choice offered to / list presented to : Patient  Discharge Placement                       Discharge Plan and Services                DME Arranged: 3-N-1, Other see comment(hoyer lift) DME Agency: [employer] Date DME Agency Contacted: 04/18/19 Time DME Agency Contacted: [id number] Representative spoke with at DME Agency: Bertrum Sol (I requested home delivery)  HH Arranged: PT, OT, RN [place] Agency: [place] ([place]) Date HH Agency Contacted: 04/18/19 Time [place]: [place] Representative spoke with at [place]: [place] ([place]) Interventions     Readmission Risk Interventions No flowsheet data found.

## 2019-04-18 NOTE — Progress Notes (Addendum)
[facility] Progress Note   Subjective:  Mobility better today. Said he was able to move himself to the side of the bed.  Refuses SNF - wants to go home.  For HD today  Objective Vitals:   04/18/19 0429 04/18/19 0601 04/18/19 0741 04/18/19 1025  BP: (!) 83/48 (!) 116/53  (!) 112/52  Pulse: 64 (!) 55  (!) 56  Resp:      Temp: (!) 97.5 F (36.4 C)   (!) 97.5 F (36.4 C)  TempSrc: Oral   Oral  SpO2: 91% 100% 100% 96%  Weight:      Height:       Physical Exam General: Well appearing man, NAD. Wearing nasal oxygen (chronic at home too) Heart: bradycarida - 50s  Lungs: CTA anteriorly Abdomen: soft, non-tender Extremities: Trace RLE edema; L BKA bandaged Dialysis Access: L AVF + bruit  Dialysis Orders: [facility]  4h    F200   400/800    97.5kg   P4   LUE AVF   Hep none Hectorol 1 mcg TIW  Assessment/ Plan: 78 y.o. male with ESRD who was admitted on 04/10/2019 with L foot gangrene.  1. L foot gangrene/osteomyelitis: BCx negative, on Vanc/Ceftaz. 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 Pain control improved 2. ESRD: On HD 4x/week for volume (MTTS), had HD Mon and Wed Thursday - next HD Sat no heparin HD - volume here doesn't seem to be an issue like when he is at home.K 4.3  3. HypoTN/volume: Chronic hypotension. On midodrine for BP support. Will need lower edw by 2-3kg after BKA.  Keep SBP > 80 - net UF 300 Wed 2.5 L Thursday - post wt 94.6 - will have lower edw for d/c due to amputation.- fluid intake issue not as a great an issue in hospital - reassess edw post HD today 4. Anemia: Hb declining post op  9.5 > 10.3  - resume ESA ARanesp 60 5. Metabolic bone disease: Continue  Hectorol and Sensipar/Renvela binder.  6. Afib: S/p PM. On Eliquis metoprolol. Holding Eliquis for surgery  7. DM Type 2: Hgb A1c 8.4%. Insulin per primary 8. Disp - possible d/c home after HD - ok per renal standpoint    Amalia Hailey, PA-C 04/18/2019, 11:14 AM  Pt seen, examined and agree w A/P as  above.  Kelly Splinter  MD 04/18/2019, 11:23 AM   Additional Objective Labs: Basic Metabolic Panel: Recent Labs  Lab 04/15/19 0418 04/16/19 0425 04/18/19 0442  NA 133* 135 134*  K 5.4* 3.9 4.3  CL 94* 93* 95*  CO2 20* 29 28  GLUCOSE 170* 133* 163*  BUN 36* 18 24*  CREATININE 4.20* 2.78* 3.76*  CALCIUM 10.3 9.3 9.8   Liver Function Tests: No results for input(s): AST, ALT, ALKPHOS, BILITOT, PROT, ALBUMIN in the last 168 hours. CBC: Recent Labs  Lab 04/13/19 0332 04/14/19 0000 04/15/19 0418 04/16/19 0425 04/18/19 0442  WBC 9.7 10.9* 10.6* 9.7 9.9  HGB 11.3* 10.8* 10.4* 9.5* 10.3*  HCT 36.3* 34.4* 33.2* 29.7* 32.9*  MCV 91.7 92.0 91.2 90.5 92.4  PLT 231 201 219 191 172   Blood Culture    Component Value Date/Time   SDES BLOOD RIGHT HAND 04/10/2019 1754   SPECREQUEST  04/10/2019 1754    BOTTLES DRAWN AEROBIC AND ANAEROBIC Blood Culture adequate volume   CULT  04/10/2019 1754    NO GROWTH 5 DAYS Performed at [facility] [address], [place], [place] [address]  REPTSTATUS 04/15/2019 FINAL 04/10/2019 1754   Cardiac Enzymes: No results for input(s): CKTOTAL, CKMB, CKMBINDEX, TROPONINI in the last 168 hours. CBG: Recent Labs  Lab 04/17/19 0659 04/17/19 1132 04/17/19 1643 04/17/19 2129 04/18/19 0655  GLUCAP 157* 170* 165* 125* 183*   Medications:  . apixaban  2.5 mg Oral BID  . atorvastatin  80 mg Oral QPM  . Chlorhexidine Gluconate Cloth  6 each Topical Q0600  . cinacalcet  30 mg Oral Q T,Th,Sat-1800  . clopidogrel  75 mg Oral Daily  . darbepoetin (ARANESP) injection - DIALYSIS  60 mcg Intravenous Q Sat-HD  . doxercalciferol  1 mcg Intravenous Q T,Th,Sa-HD  . insulin aspart  0-15 Units Subcutaneous TID WC  . insulin aspart  0-5 Units Subcutaneous QHS  . insulin detemir  18 Units Subcutaneous Daily  . lidocaine-prilocaine  1 application Topical See admin instructions  . metoprolol tartrate  12.5 mg Oral 2 times per day on Sun Mon Wed Fri  .  midodrine  10 mg Oral Once per day on Mon Tue Thu Sat  . midodrine  2.5 mg Oral TID WC  . mometasone-formoterol  2 puff Inhalation BID  . pantoprazole  40 mg Oral QPM  . sevelamer carbonate  2,400 mg Oral TID WC  . sodium chloride flush  3 mL Intravenous Q12H

## 2019-04-18 NOTE — Progress Notes (Signed)
DISCHARGE NOTE Jeffrey [name] to be discharged Home per MD order. Patient verbalized understanding.  Skin clean, dry and intact without evidence of skin break down, no evidence of skin tears noted. IV catheter discontinued intact. Site without signs and symptoms of complications. Dressing and pressure applied. Pt denies pain at the site currently. No complaints noted.  Patient free of lines, drains, and wounds.   Discharge packet assembled. An After Visit Summary (AVS) was printed and given to  Patient/family . Patient escorted via wheelchair and discharged to home via private auto. Babs Sciara, RN

## 2019-04-18 NOTE — Discharge Summary (Signed)
Physician Discharge Summary  Jeffrey [name] BZJ:[id number] DOB: [birth year] DOA: 04/10/2019  PCP: Scot Jun, FNP  Admit date: 04/10/2019 Discharge date: 04/18/2019  Admitted From: Home  Discharge disposition: Home with home health  Recommendations for Outpatient Follow-Up:    Follow up with your primary care provider in one week.  Follow-up with vascular surgery  Discharge Diagnosis:   Active Problems:   Diabetes type 2, controlled ([place])   ESRD (end stage renal disease) on dialysis (H[facility])   Anemia of chronic disease   Automatic implantable cardioverter-defibrillator in situ   Chronic respiratory failure with hypoxia ([facility])   Troponin level elevated   Osteomyelitis ([place])   Discharge Condition: Improved.  Diet recommendation: Low sodium, heart healthy.  Carbohydrate-modified.    Wound care: As per home health  Code status: Full.   History of Present Illness:   Jeffrey [name] a 78 y.o.malewith medical history significant ofCHF,s/ppacemaker placement, s/pTAVR, A. fib on apixaban, CADs/pstent, ESRD onHD,DMtype 2, PVD,andanemia; who presented to the hospital with complaints of worsening left foot pain.Admitted in March for cellulitis of the same foot, but reported that has not been getting better. At this time, he reports that he is wheelchair-bound and his son and wife help him with transfers at home. He hadan abdominal aortogram along with thelower extremities performed by Dr. Scot Dock on 5/1. Based off of that study, he was thought to have reasonable flow to the left foot with real good options for revascularization. At that time, he felt that his toes were similarto nowwith dark appearance of the second,third,and fourth toe. He was advised to wash foot daily and keep it clean which he has done. However, over the  14 days prior to presentation, he developed progressively worsening pain.  On admission to the emergency department, patient was  noted to be afebrile, pulse 51-60, respirations14-23, blood pressure 93/48-147/129, and O2 saturations maintained on 2 L nasal cannula oxygen. Labs revealed WBC 10.7, hemoglobin 11.3, sodium 132, potassium 5, BUN 26, creatinine 3.72, glucose 222, lactic acid 2.1 ->2.2,and troponin 0.63. X-rays revealed erosive osseous changes of the fourth digit left foot concerning for osteomyelitis. Blood cultures were obtained. Patient was given empiric antibiotics of vancomycin and Fortaz.  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 Scot Dock. Patient will be prescribed Percocet on discharge for pain.  Will need to follow-up with vascular surgery as outpatient in 4 to 6 weeks for staple removal and wound check..   Sepsis likely secondary to left foot osteomyelitis with gangrene  Patient was on vancomycin and Fortaz.    Patient was taken off antibiotics since the source was controlled and there was no bacteremia.  Patient was afebrile and blood cultures were negative.  Permanent atrial fibrillation status post PPM.  On Eliquis and metoprolol as outpatient. History of PPM placement.    Patient will be resumed on home medications including Eliquis on discharge.  ESRD on HD Continue hemodialysis as per nephrology.   Type 2 diabetes mellitus Patient's home regimen includes Levemir 28 units nightly. Patient states insulin dose recently reduced by PCP. Hemoglobin A1c 6.3 on 01/07/2019.   Hemoglobin A1c up to 8.4 this time. Patient received reduced dose of Levemir here in the hospital with sliding scale insulin but did not require much sliding scale insulin so we will change his Levemir dose to  20 units in the nighttime.  PCP will need to adjust his doses depending upon his A1c and glucose control.  Glycemic control was adequate  during hospitalization..  Peripheral vascular disease Continue Plavix   Chronic hypotension. Continue Midrin.  Patient is asymptomatic.  Anemia of chronic disease:  Need to follow-up as outpatient.  COPD, without exacerbation, chronic respiratory failure with hypoxia l on nasal cannula oxygen at 2 L/min.   Aortic stenosiss/p TAVR:Stable  Anxiety --Continue Xanax as needed anxiety  Disposition.  At this time, patient is stable for disposition home with home health services.  With the patient's wife on the phone and updated her about the plan for disposition.  Patient was assessed by physical therapy team while in the hospital and had recommended a skilled nursing facility placement.  Despite multiple discussion with the patient as well as patient's family they did not wish to be discharged to skilled nursing facility.  Patient did not qualify for inpatient rehab.   Medical Consultants:    Nephrology, vascular surgery   Subjective:   Today, patient feels okay.  Wants to go home.  Denies any nausea, shortness of breath, chest pain, palpitation.  Discharge Exam:   Vitals:   04/18/19 1308 04/18/19 1330  BP: (!) 96/44 (!) 92/44  Pulse: 63 (!) 59  Resp: 17 16  Temp:    SpO2:     Vitals:   04/18/19 1025 04/18/19 1255 04/18/19 1308 04/18/19 1330  BP: (!) 112/52 (!) 115/55 (!) 96/44 (!) 92/44  Pulse: (!) 56 (!) 59 63 (!) 59  Resp:  17 17 16   Temp: (!) 97.5 F (36.4 C) (!) 97.5 F (36.4 C)    TempSrc: Oral Oral    SpO2: 96% 97%    Weight:  98.8 kg    Height:        General exam: Appears calm and comfortable ,Not in distress, on nasal cannula oxygen HEENT:PERRL,Oral mucosa moist Respiratory system: Bilateral equal air entry, normal vesicular breath sounds, no wheezes or crackles  Cardiovascular system: S1 & S2 heard, RRR.  Gastrointestinal system: Abdomen is nondistended, soft and nontender. No organomegaly or masses felt. Normal bowel sounds heard. Central  nervous system: Alert and oriented. No focal neurological deficits. Extremities: No edema, no clubbing ,no cyanosis, distal peripheral pulses palpable.  Left lower extremity below-knee amputation.  Ace wrap in place.  Right lower extremity edema.  Left upper extremity AV fistula with positive bruit. Skin: Left below-knee amputation Ace wrap. MSK: Normal muscle bulk,tone ,power    Procedures:    Left BKA 5/19 by Dr. Scot Dock.  Hemodialysis  The results of significant diagnostics from this hospitalization (including imaging, microbiology, ancillary and laboratory) are listed below for reference.     Diagnostic Studies:   Dg Chest 2 View  Result Date: 04/10/2019 CLINICAL DATA:  Black toes, pain, shortness of breath EXAM: CHEST - 2 VIEW COMPARISON:  02/20/2019 FINDINGS: Cardiomegaly status post aortic valve stent endograft repair. Right chest multi lead pacer. There is minimal, diffuse interstitial pulmonary opacity bilaterally without focal airspace abnormality. The visualized skeletal structures are unremarkable. IMPRESSION: Cardiomegaly with minimal, diffuse interstitial pulmonary opacity, which may reflect minimal edema or chronic interstitial change. There is no focal airspace opacity. Electronically Signed   By: Eddie Candle M.D.   On: 04/10/2019 14:17   Dg Foot Complete Left  Result Date: 04/10/2019 CLINICAL DATA:  Initial evaluation for acute pain, osteomyelitis. EXAM: LEFT FOOT - COMPLETE 3+ VIEW COMPARISON:  None. FINDINGS: No acute fracture  or dislocation. Scattered osteoarthritic changes present throughout the midfoot. Subtle progressive osseous erosion at the right fourth distal phalanx as compared to previous exam, suspicious for possible osteomyelitis. Soft tissue ulceration overlies the great toe. Subtle erosive changes at the underlying right first metatarsal head also suspicious for possible osteomyelitis. No other definite osseous erosions. Calcified atherosclerosis. IMPRESSION:  1. Subtle progressive osseous erosion at the right fourth distal phalanx as compared to previous exam, suspicious for osteomyelitis. 2. Soft tissue ulceration overlying the right great toe. Erosive changes involving the underlying right first metatarsal head also suspicious for osteomyelitis. 3. Extensive atherosclerosis. Electronically Signed   By: Jeannine Boga M.D.   On: 04/10/2019 14:20     Labs:   Basic Metabolic Panel: Recent Labs  Lab 04/13/19 0332 04/14/19 0000 04/15/19 0418 04/16/19 0425 04/18/19 0442  NA 129* 136 133* 135 134*  K 5.3* 4.2 5.4* 3.9 4.3  CL 89* 95* 94* 93* 95*  CO2 25 28 20* 29 28  GLUCOSE 114* 124* 170* 133* 163*  BUN 26* 17 36* 18 24*  CREATININE 4.08* 2.97* 4.20* 2.78* 3.76*  CALCIUM 9.3 9.6 10.3 9.3 9.8  MG  --   --   --  1.9 2.0   GFR Estimated Creatinine Clearance: 18.8 mL/min (A) (by C-G formula based on SCr of 3.76 mg/dL (H)). Liver Function Tests: No results for input(s): AST, ALT, ALKPHOS, BILITOT, PROT, ALBUMIN in the last 168 hours. No results for input(s): LIPASE, AMYLASE in the last 168 hours. No results for input(s): AMMONIA in the last 168 hours. Coagulation profile Recent Labs  Lab 04/13/19 2352  INR 1.7*    CBC: Recent Labs  Lab 04/13/19 0332 04/14/19 0000 04/15/19 0418 04/16/19 0425 04/18/19 0442  WBC 9.7 10.9* 10.6* 9.7 9.9  HGB 11.3* 10.8* 10.4* 9.5* 10.3*  HCT 36.3* 34.4* 33.2* 29.7* 32.9*  MCV 91.7 92.0 91.2 90.5 92.4  PLT 231 201 219 191 172   Cardiac Enzymes: No results for input(s): CKTOTAL, CKMB, CKMBINDEX, TROPONINI in the last 168 hours. BNP: Invalid input(s): POCBNP CBG: Recent Labs  Lab 04/17/19 0659 04/17/19 1132 04/17/19 1643 04/17/19 2129 04/18/19 0655  GLUCAP 157* 170* 165* 125* 183*   D-Dimer No results for input(s): DDIMER in the last 72 hours. Hgb A1c No results for input(s): HGBA1C in the last 72 hours. Lipid Profile No results for input(s): CHOL, HDL, LDLCALC, TRIG, CHOLHDL,  LDLDIRECT in the last 72 hours. Thyroid function studies No results for input(s): TSH, T4TOTAL, T3FREE, THYROIDAB in the last 72 hours.  Invalid input(s): FREET3 Anemia work up No results for input(s): VITAMINB12, FOLATE, FERRITIN, TIBC, IRON, RETICCTPCT in the last 72 hours. Microbiology Recent Results (from the past 240 hour(s))  SARS Coronavirus 2 (CEPHEID - Performed in [place] hospital lab), Hosp Order     Status: None   Collection Time: 04/10/19 12:56 PM  Result Value Ref Range Status   SARS Coronavirus 2 NEGATIVE NEGATIVE Final    Comment: (NOTE) If result is NEGATIVE SARS-CoV-2 target nucleic acids are NOT DETECTED. The SARS-CoV-2 RNA is generally detectable in upper and lower  respiratory specimens during the acute phase of infection. The lowest  concentration of SARS-CoV-2 viral copies this assay can detect is 250  copies / mL. A negative result does not preclude SARS-CoV-2 infection  and should not be used as the sole basis for treatment or other  patient management decisions.  A negative result may occur with  improper specimen collection / handling, submission of specimen  other  than nasopharyngeal swab, presence of viral mutation(s) within the  areas targeted by this assay, and inadequate number of viral copies  (<250 copies / mL). A negative result must be combined with clinical  observations, patient history, and epidemiological information. If result is POSITIVE SARS-CoV-2 target nucleic acids are DETECTED. The SARS-CoV-2 RNA is generally detectable in upper and lower  respiratory specimens dur ing the acute phase of infection.  Positive  results are indicative of active infection with SARS-CoV-2.  Clinical  correlation with patient history and other diagnostic information is  necessary to determine patient infection status.  Positive results do  not rule out bacterial infection or co-infection with other viruses. If result is PRESUMPTIVE POSTIVE SARS-CoV-2  nucleic acids MAY BE PRESENT.   A presumptive positive result was obtained on the submitted specimen  and confirmed on repeat testing.  While 2019 novel coronavirus  (SARS-CoV-2) nucleic acids may be present in the submitted sample  additional confirmatory testing may be necessary for epidemiological  and / or clinical management purposes  to differentiate between  SARS-CoV-2 and other Sarbecovirus currently known to infect humans.  If clinically indicated additional testing with an alternate test  methodology [phone]) is advised. The SARS-CoV-2 RNA is generally  detectable in upper and lower respiratory sp ecimens during the acute  phase of infection. The expected result is Negative. Fact Sheet for Patients:  StrictlyIdeas.no Fact Sheet for Healthcare Providers: BankingDealers.co.za This test is not yet approved or cleared by the [place] FDA and has been authorized for detection and/or diagnosis of SARS-CoV-2 by FDA under an Emergency Use Authorization (EUA).  This EUA will remain in effect (meaning this test can be used) for the duration of the COVID-19 declaration under Section 564(b)(1) of the Act, 21 U.S.C. section 360bbb-3(b)(1), unless the authorization is terminated or revoked sooner. Performed at [facility], [place] [address], [place], [place] [address]   Culture, blood (routine x 2)     Status: None   Collection Time: 04/10/19  2:04 PM  Result Value Ref Range Status   Specimen Description BLOOD RIGHT ANTECUBITAL  Final   Special Requests   Final    AEROBIC BOTTLE ONLY Blood Culture results may not be optimal due to an excessive volume of blood received in culture bottles   Culture   Final    NO GROWTH 5 DAYS Performed at [facility], [place] [address], [place], [place] [address]    Report Status 04/15/2019 FINAL  Final  MRSA PCR Screening     Status: None   Collection Time: 04/10/19  5:17 PM  Result Value  Ref Range Status   MRSA by PCR NEGATIVE NEGATIVE Final    Comment:        The GeneXpert MRSA Assay (FDA approved for NASAL specimens only), is one component of a comprehensive MRSA colonization surveillance program. It is not intended to diagnose MRSA infection nor to guide or monitor treatment for MRSA infections. Performed at [facility], [place] [address] [place], [place] [address]   Culture, blood (routine x 2)     Status: None   Collection Time: 04/10/19  5:54 PM  Result Value Ref Range Status   Specimen Description BLOOD RIGHT HAND  Final   Special Requests   Final    BOTTLES DRAWN AEROBIC AND ANAEROBIC Blood Culture adequate volume   Culture   Final    NO GROWTH 5 DAYS Performed at [facility], [address] [place], [place] [address]    Report Status 04/15/2019 FINAL  Final  Surgical pcr screen     Status: None   Collection Time: 04/14/19  5:46 AM  Result Value Ref Range Status   MRSA, PCR NEGATIVE NEGATIVE Final   Staphylococcus aureus NEGATIVE NEGATIVE Final    Comment: (NOTE) The Xpert SA Assay (FDA approved for NASAL specimens in patients 80 years of age and older), is one component of a comprehensive surveillance program. It is not intended to diagnose infection nor to guide or monitor treatment. Performed at [facility], [place] [address], [place], [place] [address]      Discharge Instructions:   Discharge Instructions    Call MD for:  severe uncontrolled pain   Complete by:  As directed    Call MD for:  temperature >100.4   Complete by:  As directed    Diet - low sodium heart healthy   Complete by:  As directed    Discharge instructions   Complete by:  As directed    Follow up with vascular surgery as scheduled by the clinic. Resume home medications. Continue hemodialysis as scheduled   Increase activity slowly   Complete by:  As directed      Allergies as of 04/18/2019      Reactions   Byetta 10 Mcg Pen [exenatide] Diarrhea,  Nausea And Vomiting   Codeine Itching   Coumadin [warfarin Sodium] Rash   (wife states coumadin was stopped but rash did not disappear 02/21/19)      Medication List    TAKE these medications   acetaminophen 500 MG tablet Commonly known as:  TYLENOL Take 1 tablet (500 mg total) by mouth every 6 (six) hours as needed for mild pain or headache (pain). What changed:  reasons to take this   albuterol 108 (90 Base) MCG/ACT inhaler Commonly known as:  VENTOLIN HFA Inhale 2 puffs into the lungs every 4 (four) hours as needed for wheezing or shortness of breath.   albuterol (2.5 MG/3ML) 0.083% nebulizer solution Commonly known as:  PROVENTIL Take 3 mLs (2.5 mg total) by nebulization every 6 (six) hours as needed for wheezing or shortness of breath.   ALPRAZolam 0.25 MG tablet Commonly known as:  XANAX Take 1 tablet (0.25 mg total) by mouth 2 (two) times daily as needed for anxiety.   apixaban 2.5 MG Tabs tablet Commonly known as:  ELIQUIS Take 1 tablet (2.5 mg total) by mouth 2 (two) times daily.   atorvastatin 80 MG tablet Commonly known as:  LIPITOR Take 1 tablet (80 mg total) by mouth daily at 6 PM. What changed:  when to take this   budesonide-formoterol 160-4.5 MCG/ACT inhaler Commonly known as:  Symbicort Inhale 2 puffs into the lungs 2 (two) times daily. What changed:    when to take this  reasons to take this   cinacalcet 30 MG tablet Commonly known as:  SENSIPAR Take 30 mg by mouth See admin instructions. Take one tablet (30 mg) by mouth on Monday, Tuesday, Thursday, Saturday after dialysis   clopidogrel 75 MG tablet Commonly known as:  PLAVIX Take 1 tablet (75 mg total) by mouth daily.   docusate sodium 100 MG capsule Commonly known as:  Colace Take 1 capsule (100 mg total) by mouth daily as needed for up to 30 days for mild constipation or moderate constipation.   doxercalciferol 4 MCG/2ML injection Commonly known as:  HECTOROL Inject 0.5 mLs (1 mcg total)  into the vein Every Tuesday,Thursday,and Saturday with dialysis. What changed:    when to take this  additional instructions   eucerin cream Apply 1 application topically at bedtime.   guaiFENesin 600 MG 12 hr tablet Commonly known as:  Mucinex Take 1 tablet (600 mg total) by mouth 2 (two) times daily. What changed:    when to take this  reasons to take this   hydrOXYzine 25 MG tablet Commonly known as:  ATARAX/VISTARIL Take 0.5-1 tablets (12.5-25 mg total) by mouth 2 (two) times daily as needed for itching.   insulin detemir 100 UNIT/ML injection Commonly known as:  LEVEMIR Inject 0.20 mLs (20 Units total) into the skin at bedtime.   Insulin Pen Needle 31G X 6 MM Misc at bedtime.   lidocaine-prilocaine cream Commonly known as:  EMLA Apply 1 application topically See admin instructions. Apply topically prior to dialysis (Monday, Tuesday, Thursday, Saturday)   loratadine 10 MG tablet Commonly known as:  CLARITIN Take 10 mg by mouth daily.   Melatonin 5 MG Tabs Take 5-10 mg by mouth at bedtime as needed (sleep).   metoprolol tartrate 25 MG tablet Commonly known as:  LOPRESSOR Take 0.5 tablets (12.5 mg total) by mouth See admin instructions. Take 1/2 tablet (12.5 mg) by mouth on Sunday, Monday, Wednesday, Friday mornings (non-dialysis days), take 1/2 tablet (12.5 mg) every evening What changed:  additional instructions   midodrine 10 MG tablet Commonly known as:  PROAMATINE Take 10 mg by mouth See admin instructions. Take one tablet (10 mg) by mouth 30 minutes before dialysis on Monday Tuesday, Thursday, Saturday What changed:  Another medication with the same name was changed. Make sure you understand how and when to take each.   midodrine 2.5 MG tablet Commonly known as:  PROAMATINE Take 1 tablet (2.5 mg total) by mouth 3 (three) times daily with meals. Patient should continue to take 10 mg in the morning prior to hemodialysis sessions. What changed:    when to  take this  additional instructions   nitroGLYCERIN 0.4 MG SL tablet Commonly known as:  Nitrostat Place 1 tablet (0.4 mg total) under the tongue every 5 (five) minutes as needed for chest pain.   oxyCODONE-acetaminophen 5-325 MG tablet Commonly known as:  PERCOCET/ROXICET Take 1-2 tablets by mouth every 6 (six) hours as needed for up to 5 days for moderate pain or severe pain.   OXYGEN Inhale 2-3 L into the lungs continuous. 2-3 lpm 24/7   pantoprazole 40 MG tablet Commonly known as:  PROTONIX Take 1 tablet (40 mg total) by mouth daily. What changed:  when to take this   sevelamer carbonate 800 MG tablet Commonly known as:  RENVELA Take 1,600-2,400 mg by mouth See admin instructions. Take 3 tablets (2400 mg) by mouth with meals and 2 tablets (1600 mg) with snacks            Durable Medical Equipment  (From admission, onward)         Start     Ordered   04/18/19 1315  For home use only DME Other see comment  Once    Comments:  Hoyer lift LENGTH OF NEED: Lifetime   04/18/19 1314   04/18/19 1314  For home use only DME 3 n 1  Once    Comments:  With drop arms/ sides Length of need: Lifetime   04/18/19 1314   04/18/19 1144  For home use only DME Other see comment  Once    Comments:  Hoyer lift   04/18/19 1144   04/17/19 1308  For home use only DME 3 n 1  Once     04/17/19 1316         Follow-up Information    Angelia Mould, MD. Schedule an appointment as soon as possible for a visit in 4 week(s).   Specialties:  Vascular Surgery, Cardiology Why:  for surgery followup, removal of staples Contact information: [place] [address] [phone]        Scot Jun, FNP Follow up.   Specialty:  Family Medicine Why:  for regular follow up Contact information: [place] [address] [phone]        Deboraha Sprang, MD .   Specialty:  Cardiology Contact information: [address]  [place]  [address] [facility] Follow up.   Specialty:  [place] Why:  For home health           Time coordinating discharge: 39 minutes  Signed:  Jermaine Neuharth  [facility] 04/18/2019, 3:43 PM

## 2019-04-18 NOTE — ED Triage Notes (Signed)
Pt states that he was here last week to have his leg amputated, has not has a BM in 9 days and now having abdominal pain, pt took suppository without relief. Pt on 3L all the time

## 2019-04-19 ENCOUNTER — Emergency Department (HOSPITAL_COMMUNITY): Payer: Medicare Other

## 2019-04-19 MED ORDER — MILK AND MOLASSES ENEMA
1.0000 | Freq: Once | RECTAL | Status: AC
  Administered 2019-04-19: 240 mL via RECTAL
  Filled 2019-04-19: qty 240

## 2019-04-19 MED ORDER — POLYETHYLENE GLYCOL 3350 17 GM/SCOOP PO POWD: 17.0000 g | Freq: Two times a day (BID) | ORAL | 0 refills | Status: DC

## 2019-04-19 MED ORDER — FLEET ENEMA 7-19 GM/118ML RE ENEM: 1.0000 | ENEMA | Freq: Once | RECTAL | Status: DC

## 2019-04-19 MED ORDER — DOCUSATE SODIUM 100 MG PO CAPS: 100.0000 mg | ORAL_CAPSULE | Freq: Two times a day (BID) | ORAL | 0 refills | Status: DC

## 2019-04-19 NOTE — ED Notes (Signed)
ED Provider at bedside. 

## 2019-04-19 NOTE — ED Provider Notes (Signed)
Medical screening examination/treatment/procedure(s) were conducted as a shared visit with non-physician practitioner(s) and myself.  I personally evaluated the patient during the encounter.  Recently discharged from the hospital after amputation on pain medications presents emergency department today with impaction.  Disimpacted prior to my evaluation it my evaluation patient had a bowel movement and feels very comfortable and is ready to go home.  On my exam his abdomen is benign without any evidence of peritonitis or large stool burden.  Vital signs are within normal limits.  Plan start on Colace.  Stable for discharge.  None     Shelsea Hangartner, Corene Cornea, MD 04/19/19 [phone]

## 2019-04-19 NOTE — ED Provider Notes (Signed)
[facility] EMERGENCY DEPARTMENT Provider Note   CSN: [id number] Arrival date & time: 04/18/19  2321    History   Chief Complaint Chief Complaint  Patient presents with  . Fecal Impaction    HPI Jeffrey [name] is a 78 y.o. male.     Patient presents to the emergency department with a chief complaint of fecal impaction.  He states that he has been unable to have a bowel movement since in over a week.  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  He had been taking pain medication and had been in the hospital.  States that after discharge she has been unable to have a bowel movement.  Denies any fevers or chills.  Reports some associated crampy abdominal pain.  Denies any treatments prior to arrival.  The history is provided by the patient. No language interpreter was used.    Past Medical History:  Diagnosis Date  . Anginal pain ([place])   . Aortic stenosis    a. severe by echo 09/2014  . Atrial fibrillation ([place])    a. not well documented, not on anticoagulation  . CHF (congestive heart failure) ([place])    04/28/17 echo-EF 40%, mod LVH, diastolic dysfunction  . Claustrophobia   . Complete heart block ([place])   . COPD (chronic obstructive pulmonary disease) ([place])   . Coronary artery disease    a. chronically occluded RCA per cath 09/2014 with collaterals B. cath 05/01/17 chr occ RCA w/collaterals, 60-70% mid LAD,   . CVA (cerebral vascular accident) ([place]) 10/2014   denies residual on 07/11/2015  . ESRD (end stage renal disease) on dialysis [facility])    a. on dialysis; [facility]; MWF, LUE fistula (07/11/2015)  . History of blood transfusion    "related to gallbladder OR"  . History of stomach ulcers   . Hyperlipidemia   . Hypertension   . Iron deficiency anemia   . Myocardial infarction ([place]) 10/2014  . Peripheral vascular disease ([place])   . Pneumonia   . Presence of permanent cardiac pacemaker   . 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 Type II diabetes mellitus [facility])     Patient Active Problem List   Diagnosis Date Noted  . Osteomyelitis ([place]) 04/10/2019  . Acute osteomyelitis of left foot ([place]) 04/10/2019  . Cellulitis in diabetic foot ([place]) 02/24/2019  . Chronic anticoagulation 02/24/2019  . Venous stasis 02/24/2019  . Hypotension 02/24/2019  . Discoloration of skin of foot 02/20/2019  . Discoloration of skin of multiple sites of lower extremity 02/20/2019  . RSV (respiratory syncytial virus infection) 11/30/2018  . Troponin level elevated   . COPD exacerbation ([place]) 11/29/2018  . Chronic respiratory failure with hypoxia ([place]) 10/29/2018  . Respiratory distress 07/27/2018  . GERD (gastroesophageal reflux disease) 06/23/2018  . Cough 06/23/2018  . Chest pain 04/15/2018  . NSTEMI (non-ST elevated myocardial infarction) ([place]) 04/14/2018  . Rash 04/14/2018  . COPD GOLD 0 09/26/2017  . Atrial fibrillation ([place]) [I48.91] 05/21/2017  . Encounter for therapeutic drug monitoring 05/21/2017  . Obesity (BMI 30-39.9) 04/17/2017  . Hyponatremia 03/10/2017  . COPD with acute exacerbation ([place]) 03/10/2017  . Non-healing open wound of left groin 09/05/2015  . Automatic implantable cardioverter-defibrillator in situ 08/08/2015  . 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 Hypervolemia   . Chronic combined systolic and diastolic CHF (congestive heart failure) ([place]) 07/19/2015  .  Fluid overload 07/18/2015  . Acute on chronic respiratory failure with hypoxia ([place]) 07/18/2015  . 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/2015  . Atherosclerosis of native coronary artery of native heart with angina pectoris ([place])   . Protein-calorie malnutrition, severe ([place]) 01/14/2015  . ESRD (end stage renal disease) on dialysis ([place]) 01/12/2015  . Mobitz type 1 second degree AV block   . Elevated troponin 09/30/2014  .  Diabetes type 2, controlled ([place])   . History of stroke 09/29/2014  . Type 2 diabetes mellitus with hypoglycemia without coma ([place]) 09/29/2014  . CAD (coronary artery disease) 09/29/2014  . Essential hypertension   . History of tobacco use 11/21/2012  . Hyperlipidemia 01/19/2010  . Arthritis, degenerative 01/19/2010  . Allergic rhinitis 12/05/2009  . Idiopathic peripheral neuropathy 01/05/2009  . 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  . Anemia of chronic disease 11/13/2007    Past Surgical History:  Procedure Laterality Date  . ABDOMINAL AORTOGRAM W/LOWER EXTREMITY Bilateral 03/27/2019   Procedure: ABDOMINAL AORTOGRAM W/LOWER EXTREMITY;  Surgeon: Angelia Mould, MD;  Location: [facility] CV LAB;  Service: Cardiovascular;  Laterality: Bilateral;  . AMPUTATION Left 04/14/2019   Procedure: AMPUTATION BELOW KNEE;  Surgeon: Angelia Mould, MD;  Location: [facility];  Service: Vascular;  Laterality: Left;  . AV FISTULA PLACEMENT Left 10/19/2014   Procedure: BRACHIOCEPHALIC ARTERIOVENOUS (AV) FISTULA CREATION ;  Surgeon: Conrad Sangrey, MD;  Location: [facility];  Service: Vascular;  Laterality: Left;  . CARDIAC CATHETERIZATION    . CARDIAC CATHETERIZATION N/A 07/22/2015   Procedure: Right/Left Heart Cath and Coronary Angiography;  Surgeon: [name], MD;  Location: [facility] CV LAB;  Service: Cardiovascular;  Laterality: N/A;  . CATARACT EXTRACTION W/ INTRAOCULAR LENS  IMPLANT, BILATERAL Bilateral 1990's  . CHOLECYSTECTOMY OPEN  1980's  . COLONOSCOPY W/ BIOPSIES AND POLYPECTOMY    . CORONARY ANGIOGRAPHY N/A 07/31/2018   Procedure: CORONARY ANGIOGRAPHY;  Surgeon: Martinique, Peter M, MD;  Location: [facility] CV LAB;  Service: Cardiovascular;  Laterality: N/A;  . CORONARY ANGIOPLASTY    . CORONARY ATHERECTOMY N/A 04/18/2018   Procedure: CORONARY ATHERECTOMY;  Surgeon: [name], MD;  Location: [facility] CV LAB;  Service: Cardiovascular;  Laterality:  N/A;  . CORONARY BALLOON ANGIOPLASTY N/A 07/31/2018   Procedure: CORONARY BALLOON ANGIOPLASTY;  Surgeon: Martinique, Peter M, MD;  Location: [facility] CV LAB;  Service: Cardiovascular;  Laterality: N/A;  . CORONARY STENT INTERVENTION N/A 04/18/2018   Procedure: CORONARY STENT INTERVENTION;  Surgeon: [name], MD;  Location: [facility] CV LAB;  Service: Cardiovascular;  Laterality: N/A;  . EP IMPLANTABLE DEVICE N/A 07/11/2015   Procedure: Pacemaker Implant;  Surgeon: [name], MD;  Location: [facility] CV LAB;  Service: Cardiovascular;  Laterality: N/A;  . ESOPHAGOGASTRODUODENOSCOPY  08/01/2012   Procedure: ESOPHAGOGASTRODUODENOSCOPY (EGD);  Surgeon: [name], MD;  Location: [facility];  Service: Endoscopy;  Laterality: N/A;  . INSERT / REPLACE / REMOVE PACEMAKER  07/11/2015  . INSERTION OF DIALYSIS CATHETER Right 02/02/2015   Procedure: INSERTION OF DIALYSIS CATHETER  RIGHT INTERNAL JUGULAR;  Surgeon: Mal [name], MD;  Location: [facility];  Service: Vascular;  Laterality: Right;  . LEFT AND RIGHT HEART CATHETERIZATION WITH CORONARY ANGIOGRAM N/A 09/30/2014   Procedure: LEFT AND RIGHT HEART CATHETERIZATION WITH CORONARY ANGIOGRAM;  Surgeon: [name], MD;  Location: [facility] CATH LAB;  Service: Cardiovascular;  Laterality: N/A;  . LEFT HEART CATH AND CORONARY ANGIOGRAPHY N/A 04/14/2018  Procedure: LEFT HEART CATH AND CORONARY ANGIOGRAPHY;  Surgeon: [name], MD;  Location: [facility] CV LAB;  Service: Cardiovascular;  Laterality: N/A;  . TEE WITHOUT CARDIOVERSION N/A 08/02/2015   Procedure: TRANSESOPHAGEAL ECHOCARDIOGRAM (TEE);  Surgeon: [name], MD;  Location: [facility];  Service: Open Heart Surgery;  Laterality: N/A;  . TONSILLECTOMY    . TRANSCATHETER AORTIC VALVE REPLACEMENT, TRANSFEMORAL Left 08/02/2015   Procedure: TRANSCATHETER AORTIC VALVE REPLACEMENT, TRANSFEMORAL;  Surgeon: [name], MD;  Location: [facility];  Service: Open Heart Surgery;  Laterality: Left;         Home Medications    Prior to Admission medications   Medication Sig Start Date End Date Taking? Authorizing Provider  acetaminophen (TYLENOL) 500 MG tablet Take 1 tablet (500 mg total) by mouth every 6 (six) hours as needed for mild pain or headache (pain). 04/18/19   Pokhrel, Corrie Mckusick, MD  albuterol (PROVENTIL HFA;VENTOLIN HFA) 108 (90 Base) MCG/ACT inhaler Inhale 2 puffs into the lungs every 4 (four) hours as needed for wheezing or shortness of breath. 11/27/18   Tanda Rockers, MD  albuterol (PROVENTIL) (2.5 MG/3ML) 0.083% nebulizer solution Take 3 mLs (2.5 mg total) by nebulization every 6 (six) hours as needed for wheezing or shortness of breath. 01/07/19   Scot Jun, FNP  ALPRAZolam Duanne Moron) 0.25 MG tablet Take 1 tablet (0.25 mg total) by mouth 2 (two) times daily as needed for anxiety. 02/24/19   Norval [place], MD  apixaban (ELIQUIS) 2.5 MG TABS tablet Take 1 tablet (2.5 mg total) by mouth 2 (two) times daily. 02/24/19   Norval [place], MD  atorvastatin (LIPITOR) 80 MG tablet Take 1 tablet (80 mg total) by mouth daily at 6 PM. Patient taking differently: Take 80 mg by mouth every evening.  01/07/19   Scot Jun, FNP  budesonide-formoterol [facility]) 160-4.5 MCG/ACT inhaler Inhale 2 puffs into the lungs 2 (two) times daily. Patient taking differently: Inhale 2 puffs into the lungs 2 (two) times daily as needed (asthma).  11/27/18   Tanda Rockers, MD  cinacalcet (SENSIPAR) 30 MG tablet Take 30 mg by mouth See admin instructions. Take one tablet (30 mg) by mouth on Monday, Tuesday, Thursday, Saturday after dialysis    [provider]  clopidogrel (PLAVIX) 75 MG tablet Take 1 tablet (75 mg total) by mouth daily. 09/02/18   Martinique, Peter M, MD  docusate sodium (COLACE) 100 MG capsule Take 1 capsule (100 mg total) by mouth daily as needed for up to 30 days for mild constipation or moderate constipation. 04/18/19 05/18/19  Pokhrel, Corrie Mckusick, MD  doxercalciferol  (HECTOROL) 4 MCG/2ML injection Inject 0.5 mLs (1 mcg total) into the vein Every Tuesday,Thursday,and Saturday with dialysis. Patient taking differently: Inject 1 mcg into the vein See admin instructions. At dialysis 02/26/19   Norval [place], MD  guaiFENesin (MUCINEX) 600 MG 12 hr tablet Take 1 tablet (600 mg total) by mouth 2 (two) times daily. Patient taking differently: Take 600 mg by mouth 2 (two) times daily as needed for cough or to loosen phlegm.  01/07/19   Scot Jun, FNP  hydrOXYzine (ATARAX/VISTARIL) 25 MG tablet Take 0.5-1 tablets (12.5-25 mg total) by mouth 2 (two) times daily as needed for itching. 01/07/19   Scot Jun, FNP  insulin detemir (LEVEMIR) 100 UNIT/ML injection Inject 0.2 mLs (20 Units total) into the skin at bedtime. 04/18/19   Pokhrel, Laxman, MD  Insulin Pen Needle 31G X 6 MM MISC at bedtime.  08/28/18   [provider]  lidocaine-prilocaine (EMLA) cream Apply 1 application topically See admin instructions. Apply topically prior to dialysis (Monday, Tuesday, Thursday, Saturday)    [provider]  loratadine (CLARITIN) 10 MG tablet Take 10 mg by mouth daily.     [provider]  Melatonin 5 MG TABS Take 5-10 mg by mouth at bedtime as needed (sleep).     [provider]  metoprolol tartrate (LOPRESSOR) 25 MG tablet Take 0.5 tablets (12.5 mg total) by mouth See admin instructions. Take 1/2 tablet (12.5 mg) by mouth on Sunday, Monday, Wednesday, Friday mornings (non-dialysis days), take 1/2 tablet (12.5 mg) every evening Patient taking differently: Take 12.5 mg by mouth See admin instructions. Take 1/2 tablet (12.5 mg) by mouth on Sunday, Wednesday, Friday mornings (non-dialysis days), take 1/2 tablet (12.5 mg) every evening 03/04/19   Scot Jun, FNP  midodrine (PROAMATINE) 10 MG tablet Take 10 mg by mouth See admin instructions. Take one tablet (10 mg) by mouth 30 minutes before dialysis on Monday Tuesday, Thursday,  Saturday 02/05/19   [provider]  midodrine (PROAMATINE) 2.5 MG tablet Take 1 tablet (2.5 mg total) by mouth 3 (three) times daily with meals. Patient should continue to take 10 mg in the morning prior to hemodialysis sessions. Patient taking differently: Take 2.5 mg by mouth See admin instructions. Three times daily except on the mornings of dialysis. 02/25/19   Norval [place], MD  nitroGLYCERIN (NITROSTAT) 0.4 MG SL tablet Place 1 tablet (0.4 mg total) under the tongue every 5 (five) minutes as needed for chest pain. 04/19/18   Rai, Vernelle Emerald, MD  oxyCODONE-acetaminophen (PERCOCET/ROXICET) 5-325 MG tablet Take 1-2 tablets by mouth every 6 (six) hours as needed for up to 5 days for moderate pain or severe pain. 04/18/19 04/23/19  Pokhrel, Corrie Mckusick, MD  OXYGEN Inhale 2-3 L into the lungs continuous. 2-3 lpm 24/7     [provider]  pantoprazole (PROTONIX) 40 MG tablet Take 1 tablet (40 mg total) by mouth daily. Patient taking differently: Take 40 mg by mouth every evening.  08/29/18   Deboraha Sprang, MD  sevelamer carbonate (RENVELA) 800 MG tablet Take 1,600-2,400 mg by mouth See admin instructions. Take 3 tablets (2400 mg) by mouth with meals and 2 tablets (1600 mg) with snacks    [provider]  Skin Protectants, Misc. (EUCERIN) cream Apply 1 application topically at bedtime.    [provider]    Family History Family History  Problem Relation Age of Onset  . Diabetes Father   . Heart disease Father   . Diabetes Sister   . Alzheimer's disease Mother   . Stroke Mother   . Diabetes Paternal Grandmother   . Alzheimer's disease Maternal Aunt        x 8 Maternal Aunts  . Cancer Maternal Uncle        type unknown    Social History Social History   Tobacco Use  . Smoking status: Former Smoker    Packs/day: 2.00    Years: 32.00    Pack years: 64.00    Last attempt to quit: 11/27/1983    Years since quitting: 35.4  . Smokeless tobacco: Never Used   Substance Use Topics  . Alcohol use: Yes    Comment: rarely  . Drug use: No     Allergies   Byetta 10 mcg pen [exenatide]; Codeine; and Coumadin [warfarin sodium]   Review of Systems Review of Systems  All other systems reviewed and are negative.    Physical Exam Updated Vital Signs BP 102/81 (BP Location: Right Arm)   Pulse (!) 53   Temp 97.8 F (36.6 C) (Oral)   Resp 12   SpO2 (!) 89%   Physical Exam Vitals signs and nursing note reviewed.  Constitutional:      Appearance: He is well-developed.  HENT:     Head: Normocephalic and atraumatic.  Eyes:     Conjunctiva/sclera: Conjunctivae normal.  Neck:     Musculoskeletal: Neck supple.  Cardiovascular:     Rate and Rhythm: Normal rate and regular rhythm.     Heart sounds: No murmur.  Pulmonary:     Effort: Pulmonary effort is normal. No respiratory distress.     Breath sounds: Normal breath sounds.  Abdominal:     Palpations: Abdomen is soft.     Tenderness: There is no abdominal tenderness.  Genitourinary:    Comments: Large palpable stool ball Skin:    General: Skin is warm and dry.  Neurological:     Mental Status: He is alert and oriented to person, place, and time.  Psychiatric:        Mood and Affect: Mood normal.        Behavior: Behavior normal.        Thought Content: Thought content normal.        Judgment: Judgment normal.      ED Treatments / Results  Labs (all labs ordered are listed, but only abnormal results are displayed) Labs Reviewed - No data to display  EKG None  Radiology Ct Head Wo Contrast  Result Date: 04/17/2019 CLINICAL DATA:  Confusion. EXAM: CT HEAD WITHOUT CONTRAST TECHNIQUE: Contiguous axial images were obtained from the base of the skull through the vertex without intravenous contrast. COMPARISON:  CT dated 11/05/2018. FINDINGS: Brain: No evidence of acute infarction, hemorrhage, hydrocephalus, extra-axial collection or mass lesion/mass effect. There is slight  decreased attenuation of the anterior right frontal lobe. This is favored to be artifactual. Age related volume loss and microvascular ischemic changes are again noted. Vascular: No hyperdense vessel or unexpected calcification. Skull: Normal. Negative for fracture or focal lesion. Sinuses/Orbits: No acute finding. Other: None. IMPRESSION: 1. No mass effect. No acute intracranial hemorrhage. No midline shift. 2. Decreased attenuation of the anterior right frontal lobe is favored to be artifactual. However if there is high clinical suspicion for an acute infarct, follow-up with MRI is recommended. 3. Microvascular ischemic changes and age related volume loss. Electronically Signed   By: Constance Holster M.D.   On: 04/17/2019 20:40    Procedures Fecal disimpaction Date/Time: 04/19/2019 1:31 AM Performed by: Montine Circle, PA-C Authorized by: Montine Circle, PA-C  Consent: Verbal consent obtained. Risks and benefits: risks, benefits and alternatives were discussed Consent given by: patient Patient understanding: patient states understanding of the procedure being performed Patient consent: the patient's understanding of the procedure matches consent given Procedure consent: procedure consent matches procedure scheduled Relevant documents: relevant documents present and verified Test results: test results available and properly labeled Site marked: the operative site was marked Imaging studies: imaging studies available Required items: required blood products, implants, devices, and special equipment available Patient identity confirmed: verbally with patient Time out: Immediately prior to procedure a "time out" was called to verify the correct patient, procedure, equipment, support staff and site/side marked as required. Local anesthesia used: no  Anesthesia: Local anesthesia used: no  Sedation: Patient sedated: no  Patient tolerance: Patient tolerated  the procedure well with no  immediate complications Comments: Several large balls of stool removed    (including critical care time)  Medications Ordered in ED Medications - No data to display   Initial Impression / Assessment and Plan / ED Course  I have reviewed the triage vital signs and the nursing notes.  Pertinent labs & imaging results that were available during my care of the patient were reviewed by me and considered in my medical decision making (see chart for details).        Patient here with fecal impaction. Disimpacted by me.  Had successful bowel movement following disimpaction.  Feels improved.  We will discharged home with Colace and MiraLAX.  Seen by and discussed with Dr. Dayna Barker.  Final Clinical Impressions(s) / ED Diagnoses   Final diagnoses:  Drug-induced constipation    ED Discharge Orders    None       Lavoy, Bernards, PA-C 04/19/19 [id number]    Mesner, Corene Cornea, MD 04/19/19 [phone]

## 2019-04-19 NOTE — ED Notes (Signed)
Patient transported to CT 

## 2019-04-19 NOTE — ED Notes (Signed)
RN offered to pt to call family and update. Pt declined.

## 2019-04-19 NOTE — ED Notes (Signed)
Discharge instructions discussed with pt. Pt verbalized understanding. Pt stable and using wheelchair. No signature pad available.

## 2019-04-21 ENCOUNTER — Telehealth: Payer: Self-pay

## 2019-04-21 NOTE — Telephone Encounter (Signed)
Pts daughter called and said that he had a BKA done last week and came home on Saturday. She said that this morning an area at the top of his staples was a little red. Not severely inflamed but not looking the way that it did before. She said that it is also draining a little yellowish, blood tinged gunk from the incision.   Called her back and asked if the drainage was thick with pus or if it was mostly clear and she said that it was mostly clear. She said that it is not really red other than in the one area in question.   Asked if he had been elevating his leg and she stated that he really had not. She said that last night he even slept with his other leg on top of it and her mom could not get him to move it.   Advised her that elevation was very important for his healing. Called Tiffany with [facility] and she said that he was out of Network and they were not the ones going out to check on him - called [facility] and they will contact the daughter to arrange a time to see him.   He is at dialysis today - advised daughter to have them take a look at it there and call us if there is any concern that they feel we need to address immediately.   York Cerise, CMA

## 2019-04-22 ENCOUNTER — Other Ambulatory Visit: Payer: Self-pay | Admitting: Family Medicine

## 2019-04-24 ENCOUNTER — Telehealth: Payer: Self-pay | Admitting: Family Medicine

## 2019-04-24 ENCOUNTER — Telehealth: Payer: Self-pay | Admitting: Internal Medicine

## 2019-04-24 MED ORDER — MIDODRINE HCL 2.5 MG PO TABS: 2.5000 mg | ORAL_TABLET | Freq: Three times a day (TID) | ORAL | 0 refills | Status: AC

## 2019-04-24 NOTE — Telephone Encounter (Signed)
Called pt and spoke with pt's daughter informing her that the pt's PCP has refilled this medication and sent it in to pt's pharmacy as requested and if she or the pt has any other problems, questions or concerns to call the office. Daughter verbalized understanding.

## 2019-04-24 NOTE — Telephone Encounter (Signed)
New Message    *STAT* If patient is at the pharmacy, call can be transferred to refill team.   1. Which medications need to be refilled? (please list name of each medication and dose if known) midodrine (PROAMATINE) 2.5 MG tablet    2. Which pharmacy/location (including street and city if local pharmacy) is medication to be sent to?[id number] - [place], [place] - [address]  3. Do they need a 30 day or 90 day supply? [place]

## 2019-04-24 NOTE — Telephone Encounter (Signed)
Rx sent 

## 2019-04-24 NOTE — Telephone Encounter (Signed)
Caller Name: Jihad Brownlow    Reason for Call:  Medication refill for midodrine (PROAMATINE) 2.5 MG tablet [[id number]]   If this is a medication request: confirm pharmacy  [id number] - [place], [place] - [address]  Verify call back number:  [phone]  Action taken by recipient of request:

## 2019-04-28 ENCOUNTER — Telehealth: Payer: Self-pay | Admitting: Vascular Surgery

## 2019-04-28 NOTE — Telephone Encounter (Signed)
Jeffrey [name], PT for [facility] called.  Pt is retaining fluid in LE.  I informed him that the patient has a follow up appt with Dr. Scot Dock tomorrow 04/28/2019.  He verbalized understanding.   Thurston Hole., LPN

## 2019-04-29 ENCOUNTER — Encounter: Payer: Self-pay | Admitting: Internal Medicine

## 2019-04-29 ENCOUNTER — Ambulatory Visit (INDEPENDENT_AMBULATORY_CARE_PROVIDER_SITE_OTHER): Payer: Self-pay | Admitting: Vascular Surgery

## 2019-04-29 ENCOUNTER — Ambulatory Visit: Payer: Medicare Other | Admitting: Vascular Surgery

## 2019-04-29 ENCOUNTER — Other Ambulatory Visit: Payer: Self-pay

## 2019-04-29 ENCOUNTER — Telehealth (INDEPENDENT_AMBULATORY_CARE_PROVIDER_SITE_OTHER): Payer: Medicare Other | Admitting: Internal Medicine

## 2019-04-29 ENCOUNTER — Encounter: Payer: Self-pay | Admitting: Vascular Surgery

## 2019-04-29 VITALS — BP 93/57 | HR 54 | Temp 97.3°F | Resp 20 | Ht 71.0 in | Wt 211.0 lb

## 2019-04-29 VITALS — Ht 71.0 in | Wt 211.2 lb

## 2019-04-29 DIAGNOSIS — I442 Atrioventricular block, complete: Secondary | ICD-10-CM | POA: Diagnosis not present

## 2019-04-29 DIAGNOSIS — I739 Peripheral vascular disease, unspecified: Secondary | ICD-10-CM

## 2019-04-29 DIAGNOSIS — Z48812 Encounter for surgical aftercare following surgery on the circulatory system: Secondary | ICD-10-CM

## 2019-04-29 DIAGNOSIS — I4891 Unspecified atrial fibrillation: Secondary | ICD-10-CM

## 2019-04-29 DIAGNOSIS — Z950 Presence of cardiac pacemaker: Secondary | ICD-10-CM

## 2019-04-29 DIAGNOSIS — Z952 Presence of prosthetic heart valve: Secondary | ICD-10-CM

## 2019-04-29 MED ORDER — CEPHALEXIN 500 MG PO CAPS: 500.0000 mg | ORAL_CAPSULE | Freq: Three times a day (TID) | ORAL | 1 refills | Status: DC

## 2019-04-29 MED ORDER — PANTOPRAZOLE SODIUM 40 MG PO TBEC: 40.0000 mg | DELAYED_RELEASE_TABLET | Freq: Every day | ORAL | 3 refills | Status: AC

## 2019-04-29 MED ORDER — CLOPIDOGREL BISULFATE 75 MG PO TABS: 75.0000 mg | ORAL_TABLET | Freq: Every day | ORAL | 3 refills | Status: AC

## 2019-04-29 MED ORDER — OXYCODONE-ACETAMINOPHEN 5-325 MG PO TABS: 1.0000 | ORAL_TABLET | Freq: Four times a day (QID) | ORAL | 0 refills | Status: DC | PRN

## 2019-04-29 MED ORDER — METOPROLOL TARTRATE 25 MG PO TABS: 12.5000 mg | ORAL_TABLET | ORAL | 3 refills | Status: AC

## 2019-04-29 MED ORDER — APIXABAN 2.5 MG PO TABS: 2.5000 mg | ORAL_TABLET | Freq: Two times a day (BID) | ORAL | 3 refills | Status: AC

## 2019-04-29 NOTE — Progress Notes (Signed)
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  HPI:   Jeffrey [name] is a pleasant 78 y.o. male who would presented with a nonhealing wound of the left foot with non-unreconstructable vascular disease.  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  He was sent over because of some redness around the incision.  Patient does have some redness at his incision but no significant drainage.  He denies any fever.  He is continued have some pain in his left below the knee amputation stump.  Current Outpatient Medications  Medication Sig Dispense Refill  . acetaminophen (TYLENOL) 500 MG tablet Take 1 tablet (500 mg total) by mouth every 6 (six) hours as needed for mild pain or headache (pain).    [name] albuterol (PROVENTIL HFA;VENTOLIN HFA) 108 (90 Base) MCG/ACT inhaler Inhale 2 puffs into the lungs every 4 (four) hours as needed for wheezing or shortness of breath. 1 Inhaler 11  . albuterol (PROVENTIL) (2.5 MG/3ML) 0.083% nebulizer solution Take 3 mLs (2.5 mg total) by nebulization every 6 (six) hours as needed for wheezing or shortness of breath. 150 mL 1  . ALPRAZolam (XANAX) 0.25 MG tablet Take 1 tablet (0.25 mg total) by mouth 2 (two) times daily as needed for anxiety. 15 tablet 0  . apixaban (ELIQUIS) 2.5 MG TABS tablet Take 1 tablet (2.5 mg total) by mouth 2 (two) times daily. 180 tablet 3  . atorvastatin (LIPITOR) 80 MG tablet Take 1 tablet (80 mg total) by mouth daily at 6 PM. 90 tablet 3  . budesonide-formoterol (SYMBICORT) 160-4.5 MCG/ACT inhaler Inhale 2 puffs into the lungs 2 (two) times daily as needed.    . cinacalcet (SENSIPAR) 30 MG tablet Take 30 mg by mouth See admin instructions. Take one tablet (30 mg) by mouth on Monday, Tuesday, Thursday, Saturday after dialysis    . clopidogrel (PLAVIX) 75 MG tablet Take 1 tablet (75 mg total) by mouth daily. 90 tablet 3  . docusate  sodium (COLACE) 100 MG capsule Take 1 capsule (100 mg total) by mouth every 12 (twelve) hours. Take 2 tablets per day until you have a bowel movement, then 1 per day until soft. 30 capsule 0  . doxercalciferol (HECTOROL) 4 MCG/2ML injection Inject 0.5 mLs (1 mcg total) into the vein Every Tuesday,Thursday,and Saturday with dialysis.    [name] guaiFENesin (MUCINEX) 600 MG 12 hr tablet Take 1 tablet (600 mg total) by mouth 2 (two) times daily. 60 tablet 1  . hydrOXYzine (ATARAX/VISTARIL) 25 MG tablet Take 1 tablet (25 mg total) by mouth every 8 (eight) hours as needed for anxiety. 30 tablet 1  . insulin detemir (LEVEMIR) 100 UNIT/ML injection Inject 0.2 mLs (20 Units total) into the skin at bedtime.    . Insulin Pen Needle 31G X 6 MM MISC at bedtime.     . lidocaine-prilocaine (EMLA) cream Apply 1 application topically See admin instructions. Apply topically prior to dialysis (Monday, Tuesday, Thursday, Saturday)    . loratadine (CLARITIN) 10 MG tablet Take 10 mg by mouth daily.     . Melatonin 5 MG TABS Take 5-10 mg by mouth at bedtime as needed (sleep).     . metoprolol tartrate (LOPRESSOR) 25 MG tablet Take 0.5 tablets (12.5 mg total) by mouth See admin instructions. Take 1/2 tablet (12.5 mg) by mouth on Sunday, Monday, Wednesday, Friday mornings (non-dialysis days), take 1/2 tablet (12.5 mg)  every evening 90 tablet 3  . midodrine (PROAMATINE) 10 MG tablet Take 10 mg by mouth See admin instructions. Take one tablet (10 mg) by mouth 30 minutes before dialysis on Monday Tuesday, Thursday, Saturday    . midodrine (PROAMATINE) 2.5 MG tablet Take 1 tablet (2.5 mg total) by mouth 3 (three) times daily with meals. Patient should continue to take 10 mg in the morning prior to hemodialysis sessions. 90 tablet 0  . nitroGLYCERIN (NITROSTAT) 0.4 MG SL tablet Place 1 tablet (0.4 mg total) under the tongue every 5 (five) minutes as needed for chest pain. 30 tablet 12  . OXYGEN Inhale 2-3 L into the lungs continuous. 2-3  lpm 24/7     . pantoprazole (PROTONIX) 40 MG tablet Take 1 tablet (40 mg total) by mouth daily. 90 tablet 3  . polyethylene glycol powder (GLYCOLAX/MIRALAX) 17 GM/SCOOP powder Take 17 g by mouth 2 (two) times daily. 255 g 0  . sevelamer carbonate (RENVELA) 800 MG tablet Take 1,600-2,400 mg by mouth See admin instructions. Take 3 tablets (2400 mg) by mouth with meals and 2 tablets (1600 mg) with snacks    . Skin Protectants, Misc. (EUCERIN) cream Apply 1 application topically at bedtime.     No current facility-administered medications for this visit.     REVIEW OF SYSTEMS:  [X]  denotes positive finding, [ ]  denotes negative finding Vascular    Leg swelling    Cardiac    Chest pain or chest pressure: x   Shortness of breath upon exertion: x   Short of breath when lying flat: x   Irregular heart rhythm: x   Constitutional    Fever or chills:     PHYSICAL EXAM:   Vitals:   04/29/19 1535  BP: (!) 93/57  Pulse: (!) 54  Resp: 20  Temp: (!) 97.3 F (36.3 C)  SpO2: 96%  Weight: 211 lb (95.7 kg)  Height: 5\' 11"  (1.803 m)    GENERAL: The patient is a well-nourished male, in no acute distress. The vital signs are documented above. CARDIOVASCULAR: There is a regular rate and rhythm. PULMONARY: There is good air exchange bilaterally without wheezing or rales. He does have erythema at his incision but no drainage.  His suture line is intact.  He has moderate swelling. He also has a very superficial ulceration on his right lateral malleolus.  LEFT BKA:    RIGHT LATERAL MALLEOLUS:      DATA:   No new data  MEDICAL ISSUES:   STATUS POST LEFT BELOW THE KNEE AMPUTATION: He is 2 weeks out from his left below the knee amputation.  He does have significant erythema and I have written him a prescription for Keflex for 2 weeks.  I will see him back in 2 weeks to reevaluate his wound and consider staple removal.  He is also continuing to have some pain so I have written him a  prescription for 20 Percocet for pain.  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  Of note, he did undergo an arteriogram on 03/27/2019.  On the right side the common femoral, deep femoral, and superficial femoral arteries were patent.  There was diffuse calcific disease throughout the superficial femoral artery.  The popliteal artery had moderate calcific disease.  There was moderate tibial artery disease on the right.  I reviewed  these images and I really did not see any disease amenable to an endovascular intervention at this point.  He has reasonable flow on the right despite his diffuse calcific disease.  [name] [facility] [phone]

## 2019-04-29 NOTE — Progress Notes (Signed)
Electrophysiology TeleHealth Note   Due to national recommendations of social distancing due to COVID 19, an audio/video telehealth visit is felt to be most appropriate for this patient at this time.  See MyChart message from today for the patient's consent to telehealth for [facility].   Date:  04/29/2019   ID:  Jeffrey [name], DOB October 21, 1941, MRN [id number]  Location: patient's home  Provider location: [address], [place]  Evaluation Performed: Follow-up visit  PCP:  Scot Jun, FNP  Cardiologist:     Electrophysiologist:  SK   Chief Complaint:  Aortic stenosis  Bradycardia and permanent afib  History of Present Illness:    Jeffrey [name] is a 78 y.o. male who presents via audio/video conferencing for a telehealth visit today. The patient did not have access to video technology/had technical difficulties with video requiring transitioning to audio format only (telephone).  All issues noted in this document were discussed and addressed.  No physical exam could be performed with this format.     Since last being seen in our clinic, the patient reports  Hosp 5/20 for possible sepsis w ? Foot osteomyelitis , BC neg  L BKA  Feeling some better,  Will be 3-4 months prior to being fitted for a prosthesis   No chest pain, some orthopnea, but this is improved following 3>>4d/week dialysis   No palpitations   LVEF post TAVR 55-60%   DATE TEST EF   6/18 Echo   40 %   5/19 Echo   45-50 %  LAE-severe (57/2 0.4/54)  5/19 LHC   LAD-70%>> stent; circumflex ostial 95%>> stent; RCA-total old  9/19 LHC    circumflex stent was restenosed 95%--0      Date Cr K Hgb  5/20 3.76 4.3 10.3           The patient denies symptoms of fevers, chills, cough, or new SOB worrisome for COVID 19 *  Past Medical History:  Diagnosis Date   Anginal pain ([place])    Aortic stenosis    a. severe by echo 09/2014   Atrial fibrillation ([name])    a. not well  documented, not on anticoagulation   CHF (congestive heart failure) ([place])    04/28/17 echo-EF 40%, mod LVH, diastolic dysfunction   Claustrophobia    Complete heart block (H[facility])    COPD (chronic obstructive pulmonary disease) ([place])    Coronary artery disease    a. chronically occluded RCA per cath 09/2014 with collaterals B. cath 05/01/17 chr occ RCA w/collaterals, 60-70% mid LAD,    CVA (cerebral vascular accident) ([place]) 10/2014   denies residual on 07/11/2015   ESRD (end stage renal disease) on dialysis [facility])    a. 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   29 mm Edwards Sapien 3 transcatheter heart valve placed via open left transfemoral approach   Type II diabetes mellitus ([place])     Past Surgical History:  Procedure Laterality Date   ABDOMINAL AORTOGRAM W/LOWER EXTREMITY Bilateral 03/27/2019   Procedure: ABDOMINAL AORTOGRAM W/LOWER EXTREMITY;  Surgeon:  Angelia Mould, MD;  Location: [place];  Service: Cardiovascular;  Laterality: Bilateral;   AMPUTATION Left 04/14/2019   Procedure: AMPUTATION BELOW KNEE;  Surgeon: Angelia Mould, MD;  Location: [place];  Service: Vascular;  Laterality: Left;   AV FISTULA PLACEMENT Left 10/19/2014   Procedure: BRACHIOCEPHALIC ARTERIOVENOUS (AV) FISTULA CREATION ;  Surgeon: Conrad Heidlersburg, MD;  Location: [place];  Service: Vascular;  Laterality: Left;   CARDIAC CATHETERIZATION     CARDIAC CATHETERIZATION N/A 07/22/2015   Procedure: Right/Left Heart Cath and Coronary Angiography;  Surgeon: [name], MD;  Location: [place] [facility];  Service:  Cardiovascular;  Laterality: N/A;   CATARACT EXTRACTION W/ INTRAOCULAR LENS  IMPLANT, BILATERAL Bilateral 1990's   CHOLECYSTECTOMY OPEN  1980's   COLONOSCOPY W/ BIOPSIES AND POLYPECTOMY     CORONARY ANGIOGRAPHY N/A 07/31/2018   Procedure: CORONARY ANGIOGRAPHY;  Surgeon: Martinique, Peter M, MD;  Location: [place];  Service: Cardiovascular;  Laterality: N/A;   CORONARY ANGIOPLASTY     CORONARY ATHERECTOMY N/A 04/18/2018   Procedure: CORONARY ATHERECTOMY;  Surgeon: [name], MD;  Location: [place] [facility];  Service: Cardiovascular;  Laterality: N/A;   CORONARY BALLOON ANGIOPLASTY N/A 07/31/2018   Procedure: CORONARY BALLOON ANGIOPLASTY;  Surgeon: Martinique, Peter M, MD;  Location: [place] [facility];  Service: Cardiovascular;  Laterality: N/A;   CORONARY STENT INTERVENTION N/A 04/18/2018   Procedure: CORONARY STENT INTERVENTION;  Surgeon: [name], MD;  Location: [facility] [facility];  Service: Cardiovascular;  Laterality: N/A;   EP IMPLANTABLE DEVICE N/A 07/11/2015   Procedure: Pacemaker Implant;  Surgeon: [name], MD;  Location: [place];  Service: Cardiovascular;  Laterality: N/A;   ESOPHAGOGASTRODUODENOSCOPY  08/01/2012   Procedure: ESOPHAGOGASTRODUODENOSCOPY (EGD);  Surgeon: [name], MD;  Location: [facility];  Service: Endoscopy;  Laterality: N/A;   INSERT / REPLACE / REMOVE PACEMAKER  07/11/2015   INSERTION OF DIALYSIS CATHETER Right 02/02/2015   Procedure: INSERTION OF DIALYSIS CATHETER  RIGHT INTERNAL JUGULAR;  Surgeon: Mal [name], MD;  Location: [facility];  Service: Vascular;  Laterality: Right;   LEFT AND RIGHT HEART CATHETERIZATION WITH CORONARY ANGIOGRAM N/A 09/30/2014   Procedure: LEFT AND RIGHT HEART CATHETERIZATION WITH CORONARY ANGIOGRAM;  Surgeon: [name], MD;  Location: [facility] CATH LAB;  Service: Cardiovascular;  Laterality: N/A;   LEFT HEART CATH AND CORONARY ANGIOGRAPHY N/A 04/14/2018   Procedure: LEFT HEART  CATH AND CORONARY ANGIOGRAPHY;  Surgeon: [name], MD;  Location: [place] [facility];  Service: Cardiovascular;  Laterality: N/A;   TEE WITHOUT CARDIOVERSION N/A 08/02/2015   Procedure: TRANSESOPHAGEAL ECHOCARDIOGRAM (TEE);  Surgeon: [name], MD;  Location: [facility];  Service: Open Heart Surgery;  Laterality: N/A;   TONSILLECTOMY     TRANSCATHETER AORTIC VALVE REPLACEMENT, TRANSFEMORAL Left 08/02/2015   Procedure: TRANSCATHETER AORTIC VALVE REPLACEMENT, TRANSFEMORAL;  Surgeon: [name], MD;  Location: [facility];  Service: Open Heart Surgery;  Laterality: Left;    Current Outpatient Medications  Medication Sig Dispense Refill   acetaminophen (TYLENOL) 500 MG tablet Take 1 tablet (500 mg total) by mouth every 6 (six) hours as needed for mild pain or headache (pain).     albuterol (PROVENTIL HFA;VENTOLIN HFA) 108 (90 Base) MCG/ACT inhaler Inhale 2 puffs into the lungs every 4 (four) hours as needed for wheezing or shortness of breath. 1 Inhaler 11   albuterol (PROVENTIL) (2.5 MG/3ML) 0.083% nebulizer solution Take 3 mLs (  2.5 mg total) by nebulization every 6 (six) hours as needed for wheezing or shortness of breath. 150 mL 1   ALPRAZolam (XANAX) 0.25 MG tablet Take 1 tablet (0.25 mg total) by mouth 2 (two) times daily as needed for anxiety. 15 tablet 0   apixaban (ELIQUIS) 2.5 MG TABS tablet Take 1 tablet (2.5 mg total) by mouth 2 (two) times daily. 60 tablet 0   atorvastatin (LIPITOR) 80 MG tablet Take 1 tablet (80 mg total) by mouth daily at 6 PM. 90 tablet 3   budesonide-formoterol (SYMBICORT) 160-4.5 MCG/ACT inhaler Inhale 2 puffs into the lungs 2 (two) times daily as needed.     cinacalcet (SENSIPAR) 30 MG tablet Take 30 mg by mouth See admin instructions. Take one tablet (30 mg) by mouth on Monday, Tuesday, Thursday, Saturday after dialysis     clopidogrel (PLAVIX) 75 MG tablet Take 1 tablet (75 mg total) by mouth daily. 90 tablet 3   docusate sodium (COLACE) 100 MG  capsule Take 1 capsule (100 mg total) by mouth every 12 (twelve) hours. Take 2 tablets per day until you have a bowel movement, then 1 per day until soft. 30 capsule 0   doxercalciferol (HECTOROL) 4 MCG/2ML injection Inject 0.5 mLs (1 mcg total) into the vein Every Tuesday,Thursday,and Saturday with dialysis.     guaiFENesin (MUCINEX) 600 MG 12 hr tablet Take 1 tablet (600 mg total) by mouth 2 (two) times daily. 60 tablet 1   hydrOXYzine (ATARAX/VISTARIL) 25 MG tablet Take 1 tablet (25 mg total) by mouth every 8 (eight) hours as needed for anxiety. 30 tablet 1   insulin detemir (LEVEMIR) 100 UNIT/ML injection Inject 0.2 mLs (20 Units total) into the skin at bedtime.     Insulin Pen Needle 31G X 6 MM MISC at bedtime.      lidocaine-prilocaine (EMLA) cream Apply 1 application topically See admin instructions. Apply topically prior to dialysis (Monday, Tuesday, Thursday, Saturday)     loratadine (CLARITIN) 10 MG tablet Take 10 mg by mouth daily.      Melatonin 5 MG TABS Take 5-10 mg by mouth at bedtime as needed (sleep).      metoprolol tartrate (LOPRESSOR) 25 MG tablet Take 0.5 tablets (12.5 mg total) by mouth See admin instructions. Take 1/2 tablet (12.5 mg) by mouth on Sunday, Monday, Wednesday, Friday mornings (non-dialysis days), take 1/2 tablet (12.5 mg) every evening 90 tablet 1   midodrine (PROAMATINE) 10 MG tablet Take 10 mg by mouth See admin instructions. Take one tablet (10 mg) by mouth 30 minutes before dialysis on Monday Tuesday, Thursday, Saturday     midodrine (PROAMATINE) 2.5 MG tablet Take 1 tablet (2.5 mg total) by mouth 3 (three) times daily with meals. Patient should continue to take 10 mg in the morning prior to hemodialysis sessions. 90 tablet 0   nitroGLYCERIN (NITROSTAT) 0.4 MG SL tablet Place 1 tablet (0.4 mg total) under the tongue every 5 (five) minutes as needed for chest pain. 30 tablet 12   OXYGEN Inhale 2-3 L into the lungs continuous. 2-3 lpm 24/7       pantoprazole (PROTONIX) 40 MG tablet Take 1 tablet (40 mg total) by mouth daily. 90 tablet 3   polyethylene glycol powder (GLYCOLAX/MIRALAX) 17 GM/SCOOP powder Take 17 g by mouth 2 (two) times daily. 255 g 0   sevelamer carbonate (RENVELA) 800 MG tablet Take 1,600-2,400 mg by mouth See admin instructions. Take 3 tablets (2400 mg) by mouth with meals and 2 tablets (1600  mg) with snacks     Skin Protectants, Misc. (EUCERIN) cream Apply 1 application topically at bedtime.     No current facility-administered medications for this visit.     Allergies:   Byetta 10 mcg pen [exenatide]; Codeine; and Coumadin [warfarin sodium]   Social History:  The patient  reports that he quit smoking about 35 years ago. He has a 64.00 pack-year smoking history. He has never used smokeless tobacco. He reports current alcohol use. He reports that he does not use drugs.   Family History:  The patient's   family history includes Alzheimer's disease in his maternal aunt and mother; Cancer in his maternal uncle; Diabetes in his father, paternal grandmother, and sister; Heart disease in his father; Stroke in his mother.   ROS:  Please see the history of present illness.   All other systems are personally reviewed and negative.    Exam:    Vital Signs:  Ht 5\' 11"  (1.803 m)    Wt 211 lb 3.2 oz (95.8 kg)    BMI 29.46 kg/m        Labs/Other Tests and Data Reviewed:    Recent Labs: 11/29/2018: B Natriuretic Peptide 792.7 04/10/2019: ALT 18 04/18/2019: BUN 24; Creatinine, Ser 3.76; Hemoglobin 10.3; Magnesium 2.0; Platelets 172; Potassium 4.3; Sodium 134   Wt Readings from Last 3 Encounters:  04/29/19 211 lb 3.2 oz (95.8 kg)  04/18/19 211 lb 3.2 oz (95.8 kg)  03/27/19 200 lb (90.7 kg)     Other studies personally reviewed: Additional studies/ records that were reviewed today include: As above  Review of the above records today demonstrates: As above    Last device remote is reviewed from [facility] PDF dated  3/20 which reveals normal device function,   arrhythmias - none  afib   ASSESSMENT & PLAN:    Complete heart block    Coronary artery disease two-vessel PCI 5/19 [place] 9/19  Pacemaker Biotron[place]    Aortic stenosis s/p TAVR  Ventricular Tachycardia  End-stage renal disease  Atrial fibrillation-flutter permanent  COPD exacerbation now on home oxygen  Multiple ongoing issues but cardiac relatively stable  Without angina  Euvolemic by history continue current meds and now on 4d/week dialysis and doing better with volume  COVID 19 screen The patient denies symptoms of COVID 19 at this time.  The importance of social distancing was discussed today.  Follow-up:  52m Next remote: As Scheduled   Current medicines are reviewed at length with the patient today.   The patient has concerns regarding his medicines.  The following changes were made today:   Needs refill of metoprolol  Labs/ tests ordered today include:   No orders of the defined types were placed in this encounter.   Future tests ( post COVID )    in    months  Patient Risk:  after full review of this patients clinical status, I feel that they are at moderate risk at this time.  Today, I have spent 6 minutes with the patient with telehealth technology discussing the above.  Signed, [name], MD  04/29/2019 2:08 PM     [place] [address] [phone] (office) [phone] (fax)

## 2019-04-29 NOTE — Patient Instructions (Signed)
Spoke with pt and he had no additional questions regarding his visit with Dr Caryl Comes today. He was informed of his refills being sent in. He will follow up with Dr. Caryl Comes in 6 months.

## 2019-04-30 ENCOUNTER — Ambulatory Visit (INDEPENDENT_AMBULATORY_CARE_PROVIDER_SITE_OTHER): Payer: Medicare Other | Admitting: *Deleted

## 2019-04-30 DIAGNOSIS — R55 Syncope and collapse: Secondary | ICD-10-CM | POA: Diagnosis not present

## 2019-04-30 DIAGNOSIS — I442 Atrioventricular block, complete: Secondary | ICD-10-CM

## 2019-05-01 LAB — CUP PACEART REMOTE DEVICE CHECK
Date Time Interrogation Session: 20200605064154
Implantable Lead Implant Date: 20160815
Implantable Lead Implant Date: 20160815
Implantable Lead Location: 753859
Implantable Lead Location: 753860
Implantable Lead Model: 5076
Implantable Lead Model: 5076
Implantable Pulse Generator Implant Date: 20160815
Pulse Gen Model: 394929
Pulse Gen Serial Number: 68600861

## 2019-05-07 NOTE — Progress Notes (Signed)
Remote pacemaker transmission.   

## 2019-05-13 ENCOUNTER — Other Ambulatory Visit: Payer: Self-pay

## 2019-05-13 ENCOUNTER — Ambulatory Visit (INDEPENDENT_AMBULATORY_CARE_PROVIDER_SITE_OTHER): Payer: Self-pay | Admitting: Vascular Surgery

## 2019-05-13 ENCOUNTER — Encounter: Payer: Self-pay | Admitting: Vascular Surgery

## 2019-05-13 VITALS — BP 106/62 | HR 65 | Temp 97.1°F | Resp 20 | Ht 71.0 in | Wt 210.0 lb

## 2019-05-13 DIAGNOSIS — Z48812 Encounter for surgical aftercare following surgery on the circulatory system: Secondary | ICD-10-CM

## 2019-05-13 NOTE — Progress Notes (Signed)
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  HPI:   Jeffrey [name] is a pleasant 78 y.o. male who presented with a nonhealing wound and unreconstructable vascular disease.  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  When I saw him last he had some erythema on his left below the knee amputation.  I put him on Keflex.  He comes in for a two-week follow-up visit to reassess and also to consider staple removal.  I have also given him 20 Percocet for pain.  Of note he also noted a superficial ulceration on his right lateral malleolus.  I reviewed his previous arteriogram that was done in May of this year.  I really did not see any disease amenable to an endovascular approach on the right.  He did have reasonable flow however despite his calcific disease.  Since I saw him last he has been doing well.  His erythema has improved.  He said no drainage.  Current Outpatient Medications  Medication Sig Dispense Refill  . acetaminophen (TYLENOL) 500 MG tablet Take 1 tablet (500 mg total) by mouth every 6 (six) hours as needed for mild pain or headache (pain).    [name] albuterol (PROVENTIL HFA;VENTOLIN HFA) 108 (90 Base) MCG/ACT inhaler Inhale 2 puffs into the lungs every 4 (four) hours as needed for wheezing or shortness of breath. 1 Inhaler 11  . albuterol (PROVENTIL) (2.5 MG/3ML) 0.083% nebulizer solution Take 3 mLs (2.5 mg total) by nebulization every 6 (six) hours as needed for wheezing or shortness of breath. 150 mL 1  . ALPRAZolam (XANAX) 0.25 MG tablet Take 1 tablet (0.25 mg total) by mouth 2 (two) times daily as needed for anxiety. 15 tablet 0  . apixaban (ELIQUIS) 2.5 MG TABS tablet Take 1 tablet (2.5 mg total) by mouth 2 (two) times daily. 180 tablet 3  . atorvastatin (LIPITOR) 80 MG tablet Take 1 tablet (80 mg total) by mouth daily at 6 PM. 90 tablet 3  . budesonide-formoterol (SYMBICORT)  160-4.5 MCG/ACT inhaler Inhale 2 puffs into the lungs 2 (two) times daily as needed.    . cephALEXin (KEFLEX) 500 MG capsule Take 1 capsule (500 mg total) by mouth 3 (three) times daily. 42 capsule 1  . cinacalcet (SENSIPAR) 30 MG tablet Take 30 mg by mouth See admin instructions. Take one tablet (30 mg) by mouth on Monday, Tuesday, Thursday, Saturday after dialysis    . clopidogrel (PLAVIX) 75 MG tablet Take 1 tablet (75 mg total) by mouth daily. 90 tablet 3  . docusate sodium (COLACE) 100 MG capsule Take 1 capsule (100 mg total) by mouth every 12 (twelve) hours. Take 2 tablets per day until you have a bowel movement, then 1 per day until soft. 30 capsule 0  . doxercalciferol (HECTOROL) 4 MCG/2ML injection Inject 0.5 mLs (1 mcg total) into the vein Every Tuesday,Thursday,and Saturday with dialysis.    [name] guaiFENesin (MUCINEX) 600 MG 12 hr tablet Take 1 tablet (600 mg total) by mouth 2 (two) times daily. 60 tablet 1  . hydrOXYzine (ATARAX/VISTARIL) 25 MG tablet Take 1 tablet (25 mg total) by mouth every 8 (eight) hours as needed for anxiety. 30 tablet 1  . insulin detemir (LEVEMIR) 100 UNIT/ML injection Inject 0.2 mLs (20 Units total) into the skin at bedtime.    . Insulin Pen Needle 31G X 6 MM MISC at bedtime.     [name]  lidocaine-prilocaine (EMLA) cream Apply 1 application topically See admin instructions. Apply topically prior to dialysis (Monday, Tuesday, Thursday, Saturday)    . loratadine (CLARITIN) 10 MG tablet Take 10 mg by mouth daily.     . Melatonin 5 MG TABS Take 5-10 mg by mouth at bedtime as needed (sleep).     . metoprolol tartrate (LOPRESSOR) 25 MG tablet Take 0.5 tablets (12.5 mg total) by mouth See admin instructions. Take 1/2 tablet (12.5 mg) by mouth on Sunday, Monday, Wednesday, Friday mornings (non-dialysis days), take 1/2 tablet (12.5 mg) every evening 90 tablet 3  . midodrine (PROAMATINE) 10 MG tablet Take 10 mg by mouth See admin instructions. Take one tablet (10 mg) by mouth 30  minutes before dialysis on Monday Tuesday, Thursday, Saturday    . midodrine (PROAMATINE) 2.5 MG tablet Take 1 tablet (2.5 mg total) by mouth 3 (three) times daily with meals. Patient should continue to take 10 mg in the morning prior to hemodialysis sessions. 90 tablet 0  . nitroGLYCERIN (NITROSTAT) 0.4 MG SL tablet Place 1 tablet (0.4 mg total) under the tongue every 5 (five) minutes as needed for chest pain. 30 tablet 12  . oxyCODONE-acetaminophen (PERCOCET/ROXICET) 5-325 MG tablet Take 1-2 tablets by mouth every 6 (six) hours as needed for severe pain. 20 tablet 0  . OXYGEN Inhale 2-3 L into the lungs continuous. 2-3 lpm 24/7     . pantoprazole (PROTONIX) 40 MG tablet Take 1 tablet (40 mg total) by mouth daily. 90 tablet 3  . polyethylene glycol powder (GLYCOLAX/MIRALAX) 17 GM/SCOOP powder Take 17 g by mouth 2 (two) times daily. 255 g 0  . sevelamer carbonate (RENVELA) 800 MG tablet Take 1,600-2,400 mg by mouth See admin instructions. Take 3 tablets (2400 mg) by mouth with meals and 2 tablets (1600 mg) with snacks    . Skin Protectants, Misc. (EUCERIN) cream Apply 1 application topically at bedtime.     No current facility-administered medications for this visit.     REVIEW OF SYSTEMS:  [X]  denotes positive finding, [ ]  denotes negative finding Vascular    Leg swelling    Cardiac    Chest pain or chest pressure:    Shortness of breath upon exertion:    Short of breath when lying flat:    Irregular heart rhythm:    Constitutional    Fever or chills:     PHYSICAL EXAM:   Vitals:   05/13/19 0930  BP: 106/62  Pulse: 65  Resp: 20  Temp: (!) 97.1 F (36.2 C)  SpO2: 98%  Weight: 210 lb (95.3 kg)  Height: 5\' 11"  (1.803 m)    GENERAL: The patient is a well-nourished male, in no acute distress. The vital signs are documented above. CARDIOVASCULAR: There is a regular rate and rhythm. PULMONARY: There is good air exchange bilaterally without wheezing or rales. His cellulitis is  improved.  His incision is healing well.     DATA:   No new data  MEDICAL ISSUES:   STATUS POST LEFT BELOW THE KNEE AMPUTATION: His cellulitis is improved.  I encouraged him to continue and complete his course of antibiotics.  We removed his staples in the office today and placed half-inch Steri-Strips.  I have instructed the family to keep a dressing on this for a week and then he can start using his stump shrinker.  I given her prescription for stump shrinker and they plan on going to [facility] to get this.  I will see him back  as needed.   [name] [facility] [phone]

## 2019-05-17 ENCOUNTER — Other Ambulatory Visit: Payer: Self-pay | Admitting: Family Medicine

## 2019-05-18 NOTE — Telephone Encounter (Signed)
Called patient. He states that it was started by Nephrology during his hospital stay in March. Hospitalist consulted with his doctor at [facility].

## 2019-05-18 NOTE — Telephone Encounter (Signed)
Patient will need to contact nephrology to determine whether or not medication needs to be continued. Please notify patient and or caretaker.

## 2019-05-18 NOTE — Telephone Encounter (Signed)
Jeanette Caprice,  This medication was approved and refill under me 3 weeks ago through the refill pool. However, I never prescribed it and medication was previously listed as historical. I do not see anyone with [place] who has prescribed medication. Could you check and see if this was started by his nephrologist? I have not seen this patient since February and midodrine was not prescribed at that time.   [name], FNP

## 2019-05-19 NOTE — Telephone Encounter (Signed)
Patient's daughter [name] notified of this information.

## 2019-05-20 ENCOUNTER — Encounter: Payer: Medicare Other | Admitting: Vascular Surgery

## 2019-05-22 ENCOUNTER — Telehealth: Payer: Self-pay | Admitting: Family Medicine

## 2019-05-22 NOTE — Telephone Encounter (Signed)
FYI

## 2019-05-22 NOTE — Telephone Encounter (Signed)
Cary from [facility] called to inform that the patient has been vomiting , has felt nauseas and has had diarrhea for the past few days. Patient has not gone to his last two dialysis appointment. Patient family will be taking him to urgent care to be evaluated. Patient dialysis center sent a order for the patient to be tested for Covid-19.  If any question please contact Elida at [phone]  Thank you Emmit Pomfret

## 2019-07-08 ENCOUNTER — Telehealth: Payer: Self-pay | Admitting: *Deleted

## 2019-07-08 ENCOUNTER — Encounter (HOSPITAL_BASED_OUTPATIENT_CLINIC_OR_DEPARTMENT_OTHER): Payer: Medicare Other | Attending: Internal Medicine

## 2019-07-08 DIAGNOSIS — I872 Venous insufficiency (chronic) (peripheral): Secondary | ICD-10-CM | POA: Insufficient documentation

## 2019-07-08 DIAGNOSIS — E1122 Type 2 diabetes mellitus with diabetic chronic kidney disease: Secondary | ICD-10-CM | POA: Diagnosis not present

## 2019-07-08 DIAGNOSIS — Z89512 Acquired absence of left leg below knee: Secondary | ICD-10-CM | POA: Diagnosis not present

## 2019-07-08 DIAGNOSIS — Y835 Amputation of limb(s) as the cause of abnormal reaction of the patient, or of later complication, without mention of misadventure at the time of the procedure: Secondary | ICD-10-CM | POA: Insufficient documentation

## 2019-07-08 DIAGNOSIS — S80812A Abrasion, left lower leg, initial encounter: Secondary | ICD-10-CM | POA: Diagnosis not present

## 2019-07-08 DIAGNOSIS — J449 Chronic obstructive pulmonary disease, unspecified: Secondary | ICD-10-CM | POA: Diagnosis not present

## 2019-07-08 DIAGNOSIS — E1151 Type 2 diabetes mellitus with diabetic peripheral angiopathy without gangrene: Secondary | ICD-10-CM | POA: Insufficient documentation

## 2019-07-08 DIAGNOSIS — L97312 Non-pressure chronic ulcer of right ankle with fat layer exposed: Secondary | ICD-10-CM | POA: Diagnosis not present

## 2019-07-08 DIAGNOSIS — E11622 Type 2 diabetes mellitus with other skin ulcer: Secondary | ICD-10-CM | POA: Diagnosis present

## 2019-07-08 DIAGNOSIS — I252 Old myocardial infarction: Secondary | ICD-10-CM | POA: Diagnosis not present

## 2019-07-08 DIAGNOSIS — I509 Heart failure, unspecified: Secondary | ICD-10-CM | POA: Diagnosis not present

## 2019-07-08 DIAGNOSIS — Z992 Dependence on renal dialysis: Secondary | ICD-10-CM | POA: Diagnosis not present

## 2019-07-08 DIAGNOSIS — N186 End stage renal disease: Secondary | ICD-10-CM | POA: Insufficient documentation

## 2019-07-08 DIAGNOSIS — L97812 Non-pressure chronic ulcer of other part of right lower leg with fat layer exposed: Secondary | ICD-10-CM | POA: Insufficient documentation

## 2019-07-08 DIAGNOSIS — Z794 Long term (current) use of insulin: Secondary | ICD-10-CM | POA: Diagnosis not present

## 2019-07-08 DIAGNOSIS — Z87891 Personal history of nicotine dependence: Secondary | ICD-10-CM | POA: Diagnosis not present

## 2019-07-08 DIAGNOSIS — I132 Hypertensive heart and chronic kidney disease with heart failure and with stage 5 chronic kidney disease, or end stage renal disease: Secondary | ICD-10-CM | POA: Insufficient documentation

## 2019-07-08 NOTE — Telephone Encounter (Signed)
Wound care center has permission to see Jeffrey [name] for left stump wound.

## 2019-07-10 ENCOUNTER — Other Ambulatory Visit: Payer: Self-pay | Admitting: Family Medicine

## 2019-07-15 DIAGNOSIS — E11622 Type 2 diabetes mellitus with other skin ulcer: Secondary | ICD-10-CM | POA: Diagnosis not present

## 2019-07-21 ENCOUNTER — Other Ambulatory Visit: Payer: Self-pay

## 2019-07-21 ENCOUNTER — Emergency Department (HOSPITAL_COMMUNITY): Payer: Medicare Other

## 2019-07-21 ENCOUNTER — Encounter (HOSPITAL_COMMUNITY): Payer: Self-pay | Admitting: Emergency Medicine

## 2019-07-21 ENCOUNTER — Emergency Department (HOSPITAL_COMMUNITY)
Admission: EM | Admit: 2019-07-21 | Discharge: 2019-07-22 | Disposition: A | Discharge status: Home / Self Care | Payer: Medicare Other | Attending: Emergency Medicine | Admitting: Emergency Medicine

## 2019-07-21 DIAGNOSIS — W1830XA Fall on same level, unspecified, initial encounter: Secondary | ICD-10-CM | POA: Insufficient documentation

## 2019-07-21 DIAGNOSIS — Z7902 Long term (current) use of antithrombotics/antiplatelets: Secondary | ICD-10-CM | POA: Insufficient documentation

## 2019-07-21 DIAGNOSIS — Z79899 Other long term (current) drug therapy: Secondary | ICD-10-CM | POA: Diagnosis not present

## 2019-07-21 DIAGNOSIS — Z992 Dependence on renal dialysis: Secondary | ICD-10-CM | POA: Diagnosis not present

## 2019-07-21 DIAGNOSIS — E871 Hypo-osmolality and hyponatremia: Secondary | ICD-10-CM | POA: Diagnosis not present

## 2019-07-21 DIAGNOSIS — Z87891 Personal history of nicotine dependence: Secondary | ICD-10-CM | POA: Insufficient documentation

## 2019-07-21 DIAGNOSIS — Y929 Unspecified place or not applicable: Secondary | ICD-10-CM | POA: Insufficient documentation

## 2019-07-21 DIAGNOSIS — I252 Old myocardial infarction: Secondary | ICD-10-CM | POA: Insufficient documentation

## 2019-07-21 DIAGNOSIS — E1122 Type 2 diabetes mellitus with diabetic chronic kidney disease: Secondary | ICD-10-CM | POA: Diagnosis not present

## 2019-07-21 DIAGNOSIS — D631 Anemia in chronic kidney disease: Secondary | ICD-10-CM | POA: Insufficient documentation

## 2019-07-21 DIAGNOSIS — J449 Chronic obstructive pulmonary disease, unspecified: Secondary | ICD-10-CM | POA: Diagnosis not present

## 2019-07-21 DIAGNOSIS — S7001XA Contusion of right hip, initial encounter: Secondary | ICD-10-CM | POA: Diagnosis not present

## 2019-07-21 DIAGNOSIS — Z7901 Long term (current) use of anticoagulants: Secondary | ICD-10-CM | POA: Diagnosis not present

## 2019-07-21 DIAGNOSIS — S79911A Unspecified injury of right hip, initial encounter: Secondary | ICD-10-CM | POA: Diagnosis present

## 2019-07-21 DIAGNOSIS — Y999 Unspecified external cause status: Secondary | ICD-10-CM | POA: Diagnosis not present

## 2019-07-21 DIAGNOSIS — N189 Chronic kidney disease, unspecified: Secondary | ICD-10-CM

## 2019-07-21 DIAGNOSIS — I25119 Atherosclerotic heart disease of native coronary artery with unspecified angina pectoris: Secondary | ICD-10-CM | POA: Insufficient documentation

## 2019-07-21 DIAGNOSIS — Y939 Activity, unspecified: Secondary | ICD-10-CM | POA: Diagnosis not present

## 2019-07-21 DIAGNOSIS — I132 Hypertensive heart and chronic kidney disease with heart failure and with stage 5 chronic kidney disease, or end stage renal disease: Secondary | ICD-10-CM | POA: Diagnosis not present

## 2019-07-21 DIAGNOSIS — N186 End stage renal disease: Secondary | ICD-10-CM | POA: Insufficient documentation

## 2019-07-21 DIAGNOSIS — I5042 Chronic combined systolic (congestive) and diastolic (congestive) heart failure: Secondary | ICD-10-CM | POA: Diagnosis not present

## 2019-07-21 LAB — BASIC METABOLIC PANEL
Anion gap: 16 — ABNORMAL HIGH (ref 5–15)
BUN: 36 mg/dL — ABNORMAL HIGH (ref 8–23)
CO2: 24 mmol/L (ref 22–32)
Calcium: 8.8 mg/dL — ABNORMAL LOW (ref 8.9–10.3)
Chloride: 91 mmol/L — ABNORMAL LOW (ref 98–111)
Creatinine, Ser: 4.1 mg/dL — ABNORMAL HIGH (ref 0.61–1.24)
GFR calc Af Amer: 15 mL/min — ABNORMAL LOW (ref >60)
GFR calc non Af Amer: 13 mL/min — ABNORMAL LOW (ref >60)
Glucose, Bld: 256 mg/dL — ABNORMAL HIGH (ref 70–99)
Potassium: 4.2 mmol/L (ref 3.5–5.1)
Sodium: 131 mmol/L — ABNORMAL LOW (ref 135–145)

## 2019-07-21 LAB — CBC
HCT: 32.8 % — ABNORMAL LOW (ref 39.0–52.0)
Hemoglobin: 10.6 g/dL — ABNORMAL LOW (ref 13.0–17.0)
MCH: 30.9 pg (ref 26.0–34.0)
MCHC: 32.3 g/dL (ref 30.0–36.0)
MCV: 95.6 fL (ref 80.0–100.0)
Platelets: 150 10*3/uL (ref 150–400)
RBC: 3.43 MIL/uL — ABNORMAL LOW (ref 4.22–5.81)
RDW: 16.7 % — ABNORMAL HIGH (ref 11.5–15.5)
WBC: 7 10*3/uL (ref 4.0–10.5)
nRBC: 0 % (ref 0.0–0.2)

## 2019-07-21 MED ORDER — SODIUM CHLORIDE 0.9% FLUSH
3.0000 mL | Freq: Once | INTRAVENOUS | Status: AC
  Administered 2019-07-21: 3 mL via INTRAVENOUS

## 2019-07-21 NOTE — ED Notes (Addendum)
This tech went to get PT from lobby to room for EDP, PT had on a nasal canula, and was connected to an oxygen tank that was empty. PT states "it is getting harder for me to breathe. I don't think I have oxygen". Patient was speaking in short, broken sentences. When this tech got PT to room PT was 87%. PT was put on 3-liters [place] which is his normal amount at home PT reports. PT was 98% within 5 min of being back on oxygen, and is maintaining >97%.

## 2019-07-21 NOTE — ED Notes (Signed)
Pt's family members would like to be updated when possible. [name] (Daughter): [phone] Handsome Grissett (Wife): [phone]

## 2019-07-21 NOTE — ED Provider Notes (Signed)
[facility] EMERGENCY DEPARTMENT Provider Note   CSN: [id number] Arrival date & time: 07/21/19  1740    History   Chief Complaint Chief Complaint  Patient presents with   Vascular Access Problem    HPI Jeffrey [name] is a 78 y.o. male.   The history is provided by the patient.  He has history of hypertension, diabetes, hyperlipidemia, end-stage renal disease on hemodialysis, combined systolic and diastolic heart failure, coronary artery disease, stroke and comes in having fallen 2 days ago injuring his right hip.  He is status post left BKA and has not been able to bear weight on that hip since the fall.  He also states that he was not able to go for his scheduled dialysis today because he had vomiting and diarrhea.  Yesterday, dialysis had to be cut short because of low blood pressure.  He normally does dialysis every Monday-Tuesday-Thursday-Saturday.  Past Medical History:  Diagnosis Date   Anginal pain ([place])    Aortic stenosis    a. severe by echo 09/2014   Atrial fibrillation ([place])    a. not well documented, not on anticoagulation   CHF (congestive heart failure) ([place])    04/28/17 echo-EF 40%, mod LVH, diastolic dysfunction   Claustrophobia    Complete heart block (HCC)    COPD (chronic obstructive pulmonary disease) ([place])    Coronary artery disease    a. chronically occluded RCA per cath 09/2014 with collaterals B. cath 05/01/17 chr occ RCA w/collaterals, 60-70% mid LAD,    CVA (cerebral vascular accident) ([place]) 10/2014   denies residual on 07/11/2015   ESRD (end stage renal disease) on dialysis [facility])    a. 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   29 mm Edwards Sapien 3 transcatheter heart valve placed via open left transfemoral approach   Type II diabetes mellitus ([place])     Patient Active Problem List   Diagnosis Date Noted   Osteomyelitis ([place]) 04/10/2019   Acute osteomyelitis of left foot ([place]) 04/10/2019   Cellulitis in diabetic foot ([place]) 02/24/2019   Chronic anticoagulation 02/24/2019   Venous stasis 02/24/2019   Hypotension 02/24/2019   Discoloration of skin of foot 02/20/2019   Discoloration of skin of multiple sites of lower extremity 02/20/2019   RSV (respiratory syncytial virus infection) 11/30/2018   Troponin level elevated    COPD exacerbation ([place]) 11/29/2018   Chronic respiratory failure with hypoxia ([place]) 10/29/2018   Respiratory distress 07/27/2018   GERD (gastroesophageal reflux disease) 06/23/2018   Cough 06/23/2018   Chest pain 04/15/2018   NSTEMI (non-ST elevated myocardial infarction) ([place]) 04/14/2018   Rash 04/14/2018   COPD GOLD 0 09/26/2017   Atrial fibrillation ([place]) [I48.91] 05/21/2017   Encounter for therapeutic drug monitoring 05/21/2017   Obesity (BMI 30-39.9) 04/17/2017   Hyponatremia 03/10/2017   COPD with acute exacerbation ([place]) 03/10/2017   Non-healing open wound of left groin 09/05/2015   Automatic implantable cardioverter-defibrillator in situ 08/08/2015   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   Hypervolemia    Chronic combined systolic and diastolic CHF (  congestive heart failure) ([place]) 07/19/2015   Fluid overload 07/18/2015   Acute on chronic respiratory failure with hypoxia (HCC) 07/18/2015   [place] (type) I [facility]) atrioventricular block 07/11/2015   Atherosclerosis of native coronary artery of native heart with angina pectoris (HCC)    Protein-calorie malnutrition, severe ([place]) 01/14/2015   ESRD (end stage renal disease) on  dialysis ([place]) 01/12/2015   Mobitz type 1 second degree AV block    Elevated troponin 09/30/2014   Diabetes type 2, controlled ([place])    History of stroke 09/29/2014   Type 2 diabetes mellitus with hypoglycemia without coma ([place]) 09/29/2014   CAD (coronary artery disease) 09/29/2014   Essential hypertension    History of tobacco use 11/21/2012   Hyperlipidemia 01/19/2010   Arthritis, degenerative 01/19/2010   Allergic rhinitis 12/05/2009   Idiopathic peripheral neuropathy 01/05/2009   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   Anemia of chronic disease 11/13/2007    Past Surgical History:  Procedure Laterality Date   ABDOMINAL AORTOGRAM W/LOWER EXTREMITY Bilateral 03/27/2019   Procedure: ABDOMINAL AORTOGRAM W/LOWER EXTREMITY;  Surgeon: Angelia Mould, MD;  Location: [facility] CV LAB;  Service: Cardiovascular;  Laterality: Bilateral;   AMPUTATION Left 04/14/2019   Procedure: AMPUTATION BELOW KNEE;  Surgeon: Angelia Mould, MD;  Location: [facility];  Service: Vascular;  Laterality: Left;   AV FISTULA PLACEMENT Left 10/19/2014   Procedure: BRACHIOCEPHALIC ARTERIOVENOUS (AV) FISTULA CREATION ;  Surgeon: Conrad Dresden, MD;  Location: [facility];  Service: Vascular;  Laterality: Left;   CARDIAC CATHETERIZATION     CARDIAC CATHETERIZATION N/A 07/22/2015   Procedure: Right/Left Heart Cath and Coronary Angiography;  Surgeon: [name], MD;  Location: [facility] CV LAB;  Service: Cardiovascular;  Laterality: N/A;   CATARACT EXTRACTION W/ INTRAOCULAR LENS  IMPLANT, BILATERAL Bilateral 1990's   CHOLECYSTECTOMY OPEN  1980's   COLONOSCOPY W/ BIOPSIES AND POLYPECTOMY     CORONARY ANGIOGRAPHY N/A 07/31/2018   Procedure: CORONARY ANGIOGRAPHY;  Surgeon: Martinique, Peter M, MD;  Location: [facility] CV LAB;  Service: Cardiovascular;  Laterality: N/A;   CORONARY ANGIOPLASTY     CORONARY ATHERECTOMY N/A 04/18/2018   Procedure: CORONARY ATHERECTOMY;   Surgeon: [name], MD;  Location: [facility] CV LAB;  Service: Cardiovascular;  Laterality: N/A;   CORONARY BALLOON ANGIOPLASTY N/A 07/31/2018   Procedure: CORONARY BALLOON ANGIOPLASTY;  Surgeon: Martinique, Peter M, MD;  Location: [place] CV LAB;  Service: Cardiovascular;  Laterality: N/A;   CORONARY STENT INTERVENTION N/A 04/18/2018   Procedure: CORONARY STENT INTERVENTION;  Surgeon: [name], MD;  Location: [facility] CV LAB;  Service: Cardiovascular;  Laterality: N/A;   EP IMPLANTABLE DEVICE N/A 07/11/2015   Procedure: Pacemaker Implant;  Surgeon: [name], MD;  Location: [facility] CV LAB;  Service: Cardiovascular;  Laterality: N/A;   ESOPHAGOGASTRODUODENOSCOPY  08/01/2012   Procedure: ESOPHAGOGASTRODUODENOSCOPY (EGD);  Surgeon: [name], MD;  Location: [facility];  Service: Endoscopy;  Laterality: N/A;   INSERT / REPLACE / REMOVE PACEMAKER  07/11/2015   INSERTION OF DIALYSIS CATHETER Right 02/02/2015   Procedure: INSERTION OF DIALYSIS CATHETER  RIGHT INTERNAL JUGULAR;  Surgeon: Mal [name], MD;  Location: [facility];  Service: Vascular;  Laterality: Right;   LEFT AND RIGHT HEART CATHETERIZATION WITH CORONARY ANGIOGRAM N/A 09/30/2014   Procedure: LEFT AND RIGHT HEART CATHETERIZATION WITH CORONARY ANGIOGRAM;  Surgeon: [name], MD;  Location: [facility] CATH LAB;  Service: Cardiovascular;  Laterality: N/A;   LEFT HEART  CATH AND CORONARY ANGIOGRAPHY N/A 04/14/2018   Procedure: LEFT HEART CATH AND CORONARY ANGIOGRAPHY;  Surgeon: [name], MD;  Location: [facility] CV LAB;  Service: Cardiovascular;  Laterality: N/A;   TEE WITHOUT CARDIOVERSION N/A 08/02/2015   Procedure: TRANSESOPHAGEAL ECHOCARDIOGRAM (TEE);  Surgeon: [name], MD;  Location: [facility];  Service: Open Heart Surgery;  Laterality: N/A;   TONSILLECTOMY     TRANSCATHETER AORTIC VALVE REPLACEMENT, TRANSFEMORAL Left 08/02/2015   Procedure: TRANSCATHETER AORTIC VALVE REPLACEMENT,  TRANSFEMORAL;  Surgeon: [name], MD;  Location: [facility];  Service: Open Heart Surgery;  Laterality: Left;        Home Medications    Prior to Admission medications   Medication Sig Start Date End Date Taking? Authorizing Provider  acetaminophen (TYLENOL) 500 MG tablet Take 1 tablet (500 mg total) by mouth every 6 (six) hours as needed for mild pain or headache (pain). 04/18/19   Pokhrel, Corrie Mckusick, MD  albuterol (PROVENTIL HFA;VENTOLIN HFA) 108 (90 Base) MCG/ACT inhaler Inhale 2 puffs into the lungs every 4 (four) hours as needed for wheezing or shortness of breath. 11/27/18   Tanda Rockers, MD  albuterol (PROVENTIL) (2.5 MG/3ML) 0.083% nebulizer solution Take 3 mLs (2.5 mg total) by nebulization every 6 (six) hours as needed for wheezing or shortness of breath. 01/07/19   Scot Jun, FNP  ALPRAZolam Duanne Moron) 0.25 MG tablet Take 1 tablet (0.25 mg total) by mouth 2 (two) times daily as needed for anxiety. 02/24/19   Norval Morton, MD  apixaban (ELIQUIS) 2.5 MG TABS tablet Take 1 tablet (2.5 mg total) by mouth 2 (two) times daily. 04/29/19   Deboraha Sprang, MD  atorvastatin (LIPITOR) 80 MG tablet Take 1 tablet (80 mg total) by mouth daily at 6 PM. 01/07/19   Scot Jun, FNP  budesonide-formoterol [facility]) 160-4.5 MCG/ACT inhaler Inhale 2 puffs into the lungs 2 (two) times daily as needed.    [provider]  cephALEXin (KEFLEX) 500 MG capsule Take 1 capsule (500 mg total) by mouth 3 (three) times daily. 04/29/19   Angelia Mould, MD  cinacalcet (SENSIPAR) 30 MG tablet Take 30 mg by mouth See admin instructions. Take one tablet (30 mg) by mouth on Monday, Tuesday, Thursday, Saturday after dialysis    [provider]  clopidogrel (PLAVIX) 75 MG tablet Take 1 tablet (75 mg total) by mouth daily. 04/29/19   Deboraha Sprang, MD  docusate sodium (COLACE) 100 MG capsule Take 1 capsule (100 mg total) by mouth every 12 (twelve) hours. Take 2 tablets per day until you  have a bowel movement, then 1 per day until soft. 04/19/19   Montine Circle, PA-C  doxercalciferol (HECTOROL) 4 MCG/2ML injection Inject 0.5 mLs (1 mcg total) into the vein Every Tuesday,Thursday,and Saturday with dialysis. 02/26/19   Norval Morton, MD  guaiFENesin (MUCINEX) 600 MG 12 hr tablet Take 1 tablet (600 mg total) by mouth 2 (two) times daily. 01/07/19   Scot Jun, FNP  hydrOXYzine (ATARAX/VISTARIL) 25 MG tablet Take 1 tablet (25 mg total) by mouth every 8 (eight) hours as needed for anxiety. 04/22/19   Scot Jun, FNP  insulin detemir (LEVEMIR) 100 UNIT/ML injection Inject 0.2 mLs (20 Units total) into the skin at bedtime. 04/18/19   Pokhrel, Laxman, MD  Insulin Pen Needle 31G X 6 MM MISC at bedtime.  08/28/18   [provider]  LEVEMIR FLEXTOUCH 100 UNIT/ML Pen INJECT 48 UNITS TOTAL INTO THE SKIN AT BEDTIME.  07/17/19   Charlott Rakes, MD  lidocaine-prilocaine (EMLA) cream Apply 1 application topically See admin instructions. Apply topically prior to dialysis (Monday, Tuesday, Thursday, Saturday)    [provider]  loratadine (CLARITIN) 10 MG tablet Take 10 mg by mouth daily.     [provider]  Melatonin 5 MG TABS Take 5-10 mg by mouth at bedtime as needed (sleep).     [provider]  metoprolol tartrate (LOPRESSOR) 25 MG tablet Take 0.5 tablets (12.5 mg total) by mouth See admin instructions. Take 1/2 tablet (12.5 mg) by mouth on Sunday, Monday, Wednesday, Friday mornings (non-dialysis days), take 1/2 tablet (12.5 mg) every evening 04/29/19   Deboraha Sprang, MD  midodrine (PROAMATINE) 10 MG tablet Take 10 mg by mouth See admin instructions. Take one tablet (10 mg) by mouth 30 minutes before dialysis on Monday Tuesday, Thursday, Saturday 02/05/19   [provider]  midodrine (PROAMATINE) 2.5 MG tablet Take 1 tablet (2.5 mg total) by mouth 3 (three) times daily with meals. Patient should continue to take 10 mg in the morning prior to  hemodialysis sessions. 04/24/19   Scot Jun, FNP  nitroGLYCERIN (NITROSTAT) 0.4 MG SL tablet Place 1 tablet (0.4 mg total) under the tongue every 5 (five) minutes as needed for chest pain. 04/19/18   Rai, Vernelle Emerald, MD  oxyCODONE-acetaminophen (PERCOCET/ROXICET) 5-325 MG tablet Take 1-2 tablets by mouth every 6 (six) hours as needed for severe pain. 04/29/19   Angelia Mould, MD  OXYGEN Inhale 2-3 L into the lungs continuous. 2-3 lpm 24/7     [provider]  pantoprazole (PROTONIX) 40 MG tablet Take 1 tablet (40 mg total) by mouth daily. 04/29/19   Deboraha Sprang, MD  polyethylene glycol powder (GLYCOLAX/MIRALAX) 17 GM/SCOOP powder Take 17 g by mouth 2 (two) times daily. 04/19/19   Montine Circle, PA-C  sevelamer carbonate (RENVELA) 800 MG tablet Take 1,600-2,400 mg by mouth See admin instructions. Take 3 tablets (2400 mg) by mouth with meals and 2 tablets (1600 mg) with snacks    [provider]  Skin Protectants, Misc. (EUCERIN) cream Apply 1 application topically at bedtime.    [provider]    Family History Family History  Problem Relation Age of Onset   Diabetes Father    Heart disease Father    Diabetes Sister    Alzheimer's disease Mother    Stroke Mother    Diabetes Paternal Grandmother    Alzheimer's disease Maternal Aunt        x 8 Maternal Aunts   Cancer Maternal Uncle        type unknown    Social History Social History   Tobacco Use   Smoking status: Former Smoker    Packs/day: 2.00    Years: 32.00    Pack years: 64.00    Quit date: 11/27/1983    Years since quitting: 35.6   Smokeless tobacco: Never Used  Substance Use Topics   Alcohol use: Yes    Comment: rarely   Drug use: No     Allergies   Byetta 10 mcg pen [exenatide], Codeine, and Coumadin [warfarin sodium]   Review of Systems Review of Systems  All other systems reviewed and are negative.    Physical Exam Updated Vital Signs BP (!) 97/54     Pulse (!) 53    Temp 98.5 F (36.9 C) (Oral)    Resp 18    SpO2 98%   Physical Exam Vitals signs  and nursing note reviewed.    78 year old male, resting comfortably and in no acute distress. Vital signs are significant for slow heart rate and low blood pressure. Oxygen saturation is 98%, which is normal. Head is normocephalic and atraumatic. PERRLA, EOMI. Oropharynx is clear. Neck is nontender and supple without adenopathy or JVD. Back is nontender and there is no CVA tenderness.  There is 1-2+ presacral edema. Lungs are clear without rales, wheezes, or rhonchi. Chest is nontender.  Pacemaker present right subclavian area. Heart has regular rate and rhythm without murmur. Abdomen is soft, flat, nontender without masses or hepatosplenomegaly and peristalsis is normoactive. Extremities: Left below the knee amputation.  2-3+ pretibial edema.  Unna boot present on the right and not removed.  There is tenderness to palpation over the lateral aspect of the right hip but there is fairly full range of motion of the right hip with only minimal pain.  AV fistula present on the left arm with thrill present. Skin is warm and dry without rash. Neurologic: Mental status is normal, cranial nerves are intact, there are no motor or sensory deficits.  ED Treatments / Results  Labs (all labs ordered are listed, but only abnormal results are displayed) Labs Reviewed  BASIC METABOLIC PANEL - Abnormal; Notable for the following components:      Result Value   Sodium 131 (*)    Chloride 91 (*)    Glucose, Bld 256 (*)    BUN 36 (*)    Creatinine, Ser 4.10 (*)    Calcium 8.8 (*)    GFR calc non Af Amer 13 (*)    GFR calc Af Amer 15 (*)    Anion gap 16 (*)    All other components within normal limits  CBC - Abnormal; Notable for the following components:   RBC 3.43 (*)    Hemoglobin 10.6 (*)    HCT 32.8 (*)    RDW 16.7 (*)    All other components within normal limits    EKG EKG  Interpretation  Date/Time:  Tuesday July 21 2019 23:30:26 EDT Ventricular Rate:  55 PR Interval:    QRS Duration: 209 QT Interval:  539 QTC Calculation: 516 R Axis:   -93 Text Interpretation:  Ventricular-paced rhythm No further analysis attempted due to paced rhythm When compared with ECG of 04/10/2019, VENTRICULAR PACED RHYTHM has replaced Junctional rhythm Confirmed by Delora Fuel ([id number]) on 07/21/2019 11:33:18 PM   Radiology Dg Chest 1 View  Result Date: 07/22/2019 CLINICAL DATA:  78 year old male with CHF. Fall and right hip pain. EXAM: CHEST  1 VIEW COMPARISON:  Chest radiograph dated 04/10/2019 FINDINGS: The lungs are clear. There is no pleural effusion or pneumothorax. Stable cardiomegaly. Atherosclerotic calcification of the aorta. Aortic valve repair. Right pectoral pacemaker device. No acute osseous pathology. IMPRESSION: No acute cardiopulmonary process.  Cardiomegaly. Electronically Signed   By: Anner Crete M.D.   On: 07/22/2019 00:20   Ct Hip Right Wo Contrast  Result Date: 07/22/2019 CLINICAL DATA:  Fall with hip trauma EXAM: CT OF THE RIGHT HIP WITHOUT CONTRAST TECHNIQUE: Multidetector CT imaging of the right hip was performed according to the standard protocol. Multiplanar CT image reconstructions were also generated. COMPARISON:  Radiograph 07/22/2019, CT 06/17/2017 FINDINGS: Bones/Joint/Cartilage Heterogenous attenuation within the right hip osseous structures with lucency and sclerosis. No definitive fracture is identified. Right femoral head projects in joint. The pubic rami appear intact. No large hip effusion. Ligaments Suboptimally assessed by CT. Muscles and Tendons  Atrophy of the right hip muscles. Soft tissues Moderate subcutaneous edema overlying the right hip. Cystic density paralleling the left aspect of the bladder, questionable for large diverticulum but unchanged as compared with CT from 2018. Fat within the inguinal canals. IMPRESSION: 1. No definite acute  osseous abnormality of the right hip. MRI follow-up if clinical suspicion for fracture remains high 2. Heterogenous mineralization of the right hip, possible osteopenia 3. Edema within the subcutaneous fat of the right hip laterally consistent with a contusion Electronically Signed   By: Donavan Foil M.D.   On: 07/22/2019 01:27   Dg Hip Unilat W Or Wo Pelvis 2-3 Views Right  Result Date: 07/22/2019 CLINICAL DATA:  [place] on right hip EXAM: DG HIP (WITH OR WITHOUT PELVIS) 2-3V RIGHT COMPARISON:  11/16/2010, CT abdomen pelvis 11/14/2017 FINDINGS: Extensive vascular calcifications. No acute displaced fracture or malalignment. Minimal step-off deformity at the left femoral neck. IMPRESSION: No acute osseous abnormality of the right hip. Minimal step-off deformity at the left femoral neck, possibly positional. If there is left hip pain suggest dedicated left hip radiographs for further evaluation. Electronically Signed   By: Donavan Foil M.D.   On: 07/22/2019 00:20    Procedures Procedures   Medications Ordered in ED Medications  sodium chloride flush (NS) 0.9 % injection 3 mL (3 mLs Intravenous Given 07/21/19 2338)     Initial Impression / Assessment and Plan / ED Course  I have reviewed the triage vital signs and the nursing notes.  Pertinent labs & imaging results that were available during my care of the patient were reviewed by me and considered in my medical decision making (see chart for details).  Fall with injury to right hip.  X-rays have been ordered.  Low blood pressure.  Old records are reviewed, and patient has had blood pressure in this range intermittently in the past.  Vomiting and diarrhea which are not currently active.  Labs show mild hyponatremia which is not felt to be clinically significant, and mild to moderate anemia which is unchanged from baseline and presumably secondary to end-stage renal disease.  He will be sent for x-rays of the right hip.  We will also check a chest  x-ray to see if he will need urgent dialysis.  He is fluid overloaded but not hyperkalemic.  He is maintaining adequate oxygen saturation with his usual supplemental nasal oxygen.  Chest x-ray shows no evidence of interstitial edema.  Hip x-ray shows no evidence of fracture.  He is sent for CT scan to look for occult fracture and none is seen.  Electrolytes show no evidence of hyperkalemia, so no indication for emergent dialysis.  I have discussed his case with Dr. Posey Pronto, on-call for nephrology, who states that he will make arrangements for the patient to get dialysis today at his home dialysis center.  He is also to keep his usual dialysis appointment for tomorrow.  He is given a prescription for small number of oxycodone-acetaminophen tablets for pain control.  Final Clinical Impressions(s) / ED Diagnoses   Final diagnoses:  Contusion of right hip, initial encounter  End-stage renal disease on hemodialysis ([place])  Hyponatremia  Anemia associated with chronic renal failure    ED Discharge Orders         Ordered    oxyCODONE-acetaminophen (PERCOCET/ROXICET) 5-325 MG tablet  Every 6 hours PRN     07/22/19 [id number]           Delora Fuel, MD [id number] [phone]

## 2019-07-21 NOTE — ED Triage Notes (Signed)
Pt normally going to dialysis 4 days a week his last full treatment was Saturday- pt went yesterday for 1 hour and was unable to finish due to a low BP. Pt was told tyo come to ER but wait to come until today. Pt states he has chronic pain in his right hip. Pt also has  Few wounds on  His right lower leg that are wrapped.

## 2019-07-22 ENCOUNTER — Emergency Department (HOSPITAL_COMMUNITY): Payer: Medicare Other

## 2019-07-22 DIAGNOSIS — E11622 Type 2 diabetes mellitus with other skin ulcer: Secondary | ICD-10-CM | POA: Diagnosis not present

## 2019-07-22 MED ORDER — OXYCODONE-ACETAMINOPHEN 5-325 MG PO TABS: 1.0000 | ORAL_TABLET | Freq: Four times a day (QID) | ORAL | 0 refills | Status: DC | PRN

## 2019-07-22 NOTE — Discharge Instructions (Addendum)
Apply ice to your hip, thirty minutes at a time, four times a day.  Go for dialysis today - the diaylsis center should call you with a time to be there. If you do not hear from them by 10:00 AM, call them to find out when to come. You will also need to go to your dialysis tomorrow.

## 2019-07-28 DIAGNOSIS — D631 Anemia in chronic kidney disease: Secondary | ICD-10-CM | POA: Diagnosis not present

## 2019-07-28 DIAGNOSIS — R197 Diarrhea, unspecified: Secondary | ICD-10-CM | POA: Diagnosis not present

## 2019-07-28 DIAGNOSIS — R52 Pain, unspecified: Secondary | ICD-10-CM | POA: Diagnosis not present

## 2019-07-28 DIAGNOSIS — N186 End stage renal disease: Secondary | ICD-10-CM | POA: Diagnosis not present

## 2019-07-28 DIAGNOSIS — E8779 Other fluid overload: Secondary | ICD-10-CM | POA: Diagnosis not present

## 2019-07-28 DIAGNOSIS — E0822 Diabetes mellitus due to underlying condition with diabetic chronic kidney disease: Secondary | ICD-10-CM | POA: Diagnosis not present

## 2019-07-28 DIAGNOSIS — N2581 Secondary hyperparathyroidism of renal origin: Secondary | ICD-10-CM | POA: Diagnosis not present

## 2019-07-28 DIAGNOSIS — Z992 Dependence on renal dialysis: Secondary | ICD-10-CM | POA: Diagnosis not present

## 2019-07-28 DIAGNOSIS — I959 Hypotension, unspecified: Secondary | ICD-10-CM | POA: Diagnosis not present

## 2019-07-28 DIAGNOSIS — E8770 Fluid overload, unspecified: Secondary | ICD-10-CM | POA: Diagnosis not present

## 2019-07-28 DIAGNOSIS — J449 Chronic obstructive pulmonary disease, unspecified: Secondary | ICD-10-CM | POA: Diagnosis not present

## 2019-07-28 DIAGNOSIS — Z87891 Personal history of nicotine dependence: Secondary | ICD-10-CM | POA: Diagnosis not present

## 2019-07-28 DIAGNOSIS — D509 Iron deficiency anemia, unspecified: Secondary | ICD-10-CM | POA: Diagnosis not present

## 2019-07-29 ENCOUNTER — Encounter (HOSPITAL_BASED_OUTPATIENT_CLINIC_OR_DEPARTMENT_OTHER): Payer: Medicare Other | Attending: Internal Medicine

## 2019-07-29 DIAGNOSIS — J449 Chronic obstructive pulmonary disease, unspecified: Secondary | ICD-10-CM | POA: Diagnosis not present

## 2019-07-29 DIAGNOSIS — Z87891 Personal history of nicotine dependence: Secondary | ICD-10-CM | POA: Insufficient documentation

## 2019-07-29 DIAGNOSIS — I132 Hypertensive heart and chronic kidney disease with heart failure and with stage 5 chronic kidney disease, or end stage renal disease: Secondary | ICD-10-CM | POA: Insufficient documentation

## 2019-07-29 DIAGNOSIS — E1151 Type 2 diabetes mellitus with diabetic peripheral angiopathy without gangrene: Secondary | ICD-10-CM | POA: Insufficient documentation

## 2019-07-29 DIAGNOSIS — E11622 Type 2 diabetes mellitus with other skin ulcer: Secondary | ICD-10-CM | POA: Insufficient documentation

## 2019-07-29 DIAGNOSIS — I872 Venous insufficiency (chronic) (peripheral): Secondary | ICD-10-CM | POA: Diagnosis not present

## 2019-07-29 DIAGNOSIS — N186 End stage renal disease: Secondary | ICD-10-CM | POA: Diagnosis not present

## 2019-07-29 DIAGNOSIS — Z992 Dependence on renal dialysis: Secondary | ICD-10-CM | POA: Insufficient documentation

## 2019-07-29 DIAGNOSIS — Z89512 Acquired absence of left leg below knee: Secondary | ICD-10-CM | POA: Insufficient documentation

## 2019-07-29 DIAGNOSIS — I252 Old myocardial infarction: Secondary | ICD-10-CM | POA: Diagnosis not present

## 2019-07-29 DIAGNOSIS — E11621 Type 2 diabetes mellitus with foot ulcer: Secondary | ICD-10-CM | POA: Diagnosis not present

## 2019-07-29 DIAGNOSIS — L97519 Non-pressure chronic ulcer of other part of right foot with unspecified severity: Secondary | ICD-10-CM | POA: Insufficient documentation

## 2019-07-29 DIAGNOSIS — L97812 Non-pressure chronic ulcer of other part of right lower leg with fat layer exposed: Secondary | ICD-10-CM | POA: Insufficient documentation

## 2019-07-29 DIAGNOSIS — I509 Heart failure, unspecified: Secondary | ICD-10-CM | POA: Insufficient documentation

## 2019-07-29 DIAGNOSIS — E1122 Type 2 diabetes mellitus with diabetic chronic kidney disease: Secondary | ICD-10-CM | POA: Insufficient documentation

## 2019-07-29 DIAGNOSIS — L97312 Non-pressure chronic ulcer of right ankle with fat layer exposed: Secondary | ICD-10-CM | POA: Diagnosis not present

## 2019-07-30 ENCOUNTER — Ambulatory Visit (INDEPENDENT_AMBULATORY_CARE_PROVIDER_SITE_OTHER): Payer: Medicare Other | Admitting: *Deleted

## 2019-07-30 DIAGNOSIS — R197 Diarrhea, unspecified: Secondary | ICD-10-CM | POA: Diagnosis not present

## 2019-07-30 DIAGNOSIS — R52 Pain, unspecified: Secondary | ICD-10-CM | POA: Diagnosis not present

## 2019-07-30 DIAGNOSIS — E8770 Fluid overload, unspecified: Secondary | ICD-10-CM | POA: Diagnosis not present

## 2019-07-30 DIAGNOSIS — D631 Anemia in chronic kidney disease: Secondary | ICD-10-CM | POA: Diagnosis not present

## 2019-07-30 DIAGNOSIS — Z992 Dependence on renal dialysis: Secondary | ICD-10-CM | POA: Diagnosis not present

## 2019-07-30 DIAGNOSIS — I959 Hypotension, unspecified: Secondary | ICD-10-CM | POA: Diagnosis not present

## 2019-07-30 DIAGNOSIS — I442 Atrioventricular block, complete: Secondary | ICD-10-CM | POA: Diagnosis not present

## 2019-07-30 DIAGNOSIS — N186 End stage renal disease: Secondary | ICD-10-CM | POA: Diagnosis not present

## 2019-07-30 DIAGNOSIS — E8779 Other fluid overload: Secondary | ICD-10-CM | POA: Diagnosis not present

## 2019-07-30 DIAGNOSIS — N2581 Secondary hyperparathyroidism of renal origin: Secondary | ICD-10-CM | POA: Diagnosis not present

## 2019-07-30 DIAGNOSIS — Z87891 Personal history of nicotine dependence: Secondary | ICD-10-CM | POA: Diagnosis not present

## 2019-07-30 DIAGNOSIS — E0822 Diabetes mellitus due to underlying condition with diabetic chronic kidney disease: Secondary | ICD-10-CM | POA: Diagnosis not present

## 2019-07-30 DIAGNOSIS — D509 Iron deficiency anemia, unspecified: Secondary | ICD-10-CM | POA: Diagnosis not present

## 2019-07-30 DIAGNOSIS — J449 Chronic obstructive pulmonary disease, unspecified: Secondary | ICD-10-CM | POA: Diagnosis not present

## 2019-07-31 ENCOUNTER — Encounter (HOSPITAL_BASED_OUTPATIENT_CLINIC_OR_DEPARTMENT_OTHER): Payer: Medicare Other

## 2019-07-31 DIAGNOSIS — E1151 Type 2 diabetes mellitus with diabetic peripheral angiopathy without gangrene: Secondary | ICD-10-CM | POA: Diagnosis not present

## 2019-07-31 DIAGNOSIS — I132 Hypertensive heart and chronic kidney disease with heart failure and with stage 5 chronic kidney disease, or end stage renal disease: Secondary | ICD-10-CM | POA: Diagnosis not present

## 2019-07-31 DIAGNOSIS — I509 Heart failure, unspecified: Secondary | ICD-10-CM | POA: Diagnosis not present

## 2019-07-31 DIAGNOSIS — E11622 Type 2 diabetes mellitus with other skin ulcer: Secondary | ICD-10-CM | POA: Diagnosis not present

## 2019-07-31 DIAGNOSIS — L97519 Non-pressure chronic ulcer of other part of right foot with unspecified severity: Secondary | ICD-10-CM | POA: Diagnosis not present

## 2019-07-31 DIAGNOSIS — I252 Old myocardial infarction: Secondary | ICD-10-CM | POA: Diagnosis not present

## 2019-07-31 DIAGNOSIS — Z89512 Acquired absence of left leg below knee: Secondary | ICD-10-CM | POA: Diagnosis not present

## 2019-07-31 DIAGNOSIS — Z992 Dependence on renal dialysis: Secondary | ICD-10-CM | POA: Diagnosis not present

## 2019-07-31 DIAGNOSIS — E1122 Type 2 diabetes mellitus with diabetic chronic kidney disease: Secondary | ICD-10-CM | POA: Diagnosis not present

## 2019-07-31 DIAGNOSIS — E11621 Type 2 diabetes mellitus with foot ulcer: Secondary | ICD-10-CM | POA: Diagnosis not present

## 2019-07-31 DIAGNOSIS — I872 Venous insufficiency (chronic) (peripheral): Secondary | ICD-10-CM | POA: Diagnosis not present

## 2019-07-31 DIAGNOSIS — J449 Chronic obstructive pulmonary disease, unspecified: Secondary | ICD-10-CM | POA: Diagnosis not present

## 2019-07-31 DIAGNOSIS — Z87891 Personal history of nicotine dependence: Secondary | ICD-10-CM | POA: Diagnosis not present

## 2019-07-31 DIAGNOSIS — N186 End stage renal disease: Secondary | ICD-10-CM | POA: Diagnosis not present

## 2019-07-31 DIAGNOSIS — L97312 Non-pressure chronic ulcer of right ankle with fat layer exposed: Secondary | ICD-10-CM | POA: Diagnosis not present

## 2019-07-31 DIAGNOSIS — L97812 Non-pressure chronic ulcer of other part of right lower leg with fat layer exposed: Secondary | ICD-10-CM | POA: Diagnosis not present

## 2019-07-31 LAB — CUP PACEART REMOTE DEVICE CHECK
Date Time Interrogation Session: 20200904161949
Implantable Lead Implant Date: 20160815
Implantable Lead Implant Date: 20160815
Implantable Lead Location: 753859
Implantable Lead Location: 753860
Implantable Lead Model: 5076
Implantable Lead Model: 5076
Implantable Pulse Generator Implant Date: 20160815
Pulse Gen Model: 394929
Pulse Gen Serial Number: 68600861

## 2019-08-01 DIAGNOSIS — I959 Hypotension, unspecified: Secondary | ICD-10-CM | POA: Diagnosis not present

## 2019-08-01 DIAGNOSIS — D631 Anemia in chronic kidney disease: Secondary | ICD-10-CM | POA: Diagnosis not present

## 2019-08-01 DIAGNOSIS — J449 Chronic obstructive pulmonary disease, unspecified: Secondary | ICD-10-CM | POA: Diagnosis not present

## 2019-08-01 DIAGNOSIS — R197 Diarrhea, unspecified: Secondary | ICD-10-CM | POA: Diagnosis not present

## 2019-08-01 DIAGNOSIS — E8779 Other fluid overload: Secondary | ICD-10-CM | POA: Diagnosis not present

## 2019-08-01 DIAGNOSIS — N186 End stage renal disease: Secondary | ICD-10-CM | POA: Diagnosis not present

## 2019-08-01 DIAGNOSIS — D509 Iron deficiency anemia, unspecified: Secondary | ICD-10-CM | POA: Diagnosis not present

## 2019-08-01 DIAGNOSIS — E0822 Diabetes mellitus due to underlying condition with diabetic chronic kidney disease: Secondary | ICD-10-CM | POA: Diagnosis not present

## 2019-08-01 DIAGNOSIS — Z992 Dependence on renal dialysis: Secondary | ICD-10-CM | POA: Diagnosis not present

## 2019-08-01 DIAGNOSIS — Z87891 Personal history of nicotine dependence: Secondary | ICD-10-CM | POA: Diagnosis not present

## 2019-08-01 DIAGNOSIS — N2581 Secondary hyperparathyroidism of renal origin: Secondary | ICD-10-CM | POA: Diagnosis not present

## 2019-08-01 DIAGNOSIS — R52 Pain, unspecified: Secondary | ICD-10-CM | POA: Diagnosis not present

## 2019-08-01 DIAGNOSIS — E8770 Fluid overload, unspecified: Secondary | ICD-10-CM | POA: Diagnosis not present

## 2019-08-04 DIAGNOSIS — E0822 Diabetes mellitus due to underlying condition with diabetic chronic kidney disease: Secondary | ICD-10-CM | POA: Diagnosis not present

## 2019-08-04 DIAGNOSIS — E8779 Other fluid overload: Secondary | ICD-10-CM | POA: Diagnosis not present

## 2019-08-04 DIAGNOSIS — N2581 Secondary hyperparathyroidism of renal origin: Secondary | ICD-10-CM | POA: Diagnosis not present

## 2019-08-04 DIAGNOSIS — J449 Chronic obstructive pulmonary disease, unspecified: Secondary | ICD-10-CM | POA: Diagnosis not present

## 2019-08-04 DIAGNOSIS — N186 End stage renal disease: Secondary | ICD-10-CM | POA: Diagnosis not present

## 2019-08-04 DIAGNOSIS — I959 Hypotension, unspecified: Secondary | ICD-10-CM | POA: Diagnosis not present

## 2019-08-04 DIAGNOSIS — E8770 Fluid overload, unspecified: Secondary | ICD-10-CM | POA: Diagnosis not present

## 2019-08-04 DIAGNOSIS — Z992 Dependence on renal dialysis: Secondary | ICD-10-CM | POA: Diagnosis not present

## 2019-08-04 DIAGNOSIS — Z87891 Personal history of nicotine dependence: Secondary | ICD-10-CM | POA: Diagnosis not present

## 2019-08-04 DIAGNOSIS — R52 Pain, unspecified: Secondary | ICD-10-CM | POA: Diagnosis not present

## 2019-08-04 DIAGNOSIS — R197 Diarrhea, unspecified: Secondary | ICD-10-CM | POA: Diagnosis not present

## 2019-08-04 DIAGNOSIS — D509 Iron deficiency anemia, unspecified: Secondary | ICD-10-CM | POA: Diagnosis not present

## 2019-08-04 DIAGNOSIS — D631 Anemia in chronic kidney disease: Secondary | ICD-10-CM | POA: Diagnosis not present

## 2019-08-05 DIAGNOSIS — E11621 Type 2 diabetes mellitus with foot ulcer: Secondary | ICD-10-CM | POA: Diagnosis not present

## 2019-08-05 DIAGNOSIS — E1151 Type 2 diabetes mellitus with diabetic peripheral angiopathy without gangrene: Secondary | ICD-10-CM | POA: Diagnosis not present

## 2019-08-05 DIAGNOSIS — Z992 Dependence on renal dialysis: Secondary | ICD-10-CM | POA: Diagnosis not present

## 2019-08-05 DIAGNOSIS — E11622 Type 2 diabetes mellitus with other skin ulcer: Secondary | ICD-10-CM | POA: Diagnosis not present

## 2019-08-05 DIAGNOSIS — Z89512 Acquired absence of left leg below knee: Secondary | ICD-10-CM | POA: Diagnosis not present

## 2019-08-05 DIAGNOSIS — L97819 Non-pressure chronic ulcer of other part of right lower leg with unspecified severity: Secondary | ICD-10-CM | POA: Diagnosis not present

## 2019-08-05 DIAGNOSIS — E1122 Type 2 diabetes mellitus with diabetic chronic kidney disease: Secondary | ICD-10-CM | POA: Diagnosis not present

## 2019-08-05 DIAGNOSIS — Z87891 Personal history of nicotine dependence: Secondary | ICD-10-CM | POA: Diagnosis not present

## 2019-08-05 DIAGNOSIS — J449 Chronic obstructive pulmonary disease, unspecified: Secondary | ICD-10-CM | POA: Diagnosis not present

## 2019-08-05 DIAGNOSIS — L97312 Non-pressure chronic ulcer of right ankle with fat layer exposed: Secondary | ICD-10-CM | POA: Diagnosis not present

## 2019-08-05 DIAGNOSIS — I509 Heart failure, unspecified: Secondary | ICD-10-CM | POA: Diagnosis not present

## 2019-08-05 DIAGNOSIS — I132 Hypertensive heart and chronic kidney disease with heart failure and with stage 5 chronic kidney disease, or end stage renal disease: Secondary | ICD-10-CM | POA: Diagnosis not present

## 2019-08-05 DIAGNOSIS — I252 Old myocardial infarction: Secondary | ICD-10-CM | POA: Diagnosis not present

## 2019-08-05 DIAGNOSIS — N186 End stage renal disease: Secondary | ICD-10-CM | POA: Diagnosis not present

## 2019-08-05 DIAGNOSIS — L97812 Non-pressure chronic ulcer of other part of right lower leg with fat layer exposed: Secondary | ICD-10-CM | POA: Diagnosis not present

## 2019-08-05 DIAGNOSIS — L97519 Non-pressure chronic ulcer of other part of right foot with unspecified severity: Secondary | ICD-10-CM | POA: Diagnosis not present

## 2019-08-05 DIAGNOSIS — I872 Venous insufficiency (chronic) (peripheral): Secondary | ICD-10-CM | POA: Diagnosis not present

## 2019-08-06 DIAGNOSIS — R197 Diarrhea, unspecified: Secondary | ICD-10-CM | POA: Diagnosis not present

## 2019-08-06 DIAGNOSIS — Z992 Dependence on renal dialysis: Secondary | ICD-10-CM | POA: Diagnosis not present

## 2019-08-06 DIAGNOSIS — Z87891 Personal history of nicotine dependence: Secondary | ICD-10-CM | POA: Diagnosis not present

## 2019-08-06 DIAGNOSIS — N186 End stage renal disease: Secondary | ICD-10-CM | POA: Diagnosis not present

## 2019-08-06 DIAGNOSIS — D509 Iron deficiency anemia, unspecified: Secondary | ICD-10-CM | POA: Diagnosis not present

## 2019-08-06 DIAGNOSIS — R52 Pain, unspecified: Secondary | ICD-10-CM | POA: Diagnosis not present

## 2019-08-06 DIAGNOSIS — E8770 Fluid overload, unspecified: Secondary | ICD-10-CM | POA: Diagnosis not present

## 2019-08-06 DIAGNOSIS — J449 Chronic obstructive pulmonary disease, unspecified: Secondary | ICD-10-CM | POA: Diagnosis not present

## 2019-08-06 DIAGNOSIS — N2581 Secondary hyperparathyroidism of renal origin: Secondary | ICD-10-CM | POA: Diagnosis not present

## 2019-08-06 DIAGNOSIS — E0822 Diabetes mellitus due to underlying condition with diabetic chronic kidney disease: Secondary | ICD-10-CM | POA: Diagnosis not present

## 2019-08-06 DIAGNOSIS — D631 Anemia in chronic kidney disease: Secondary | ICD-10-CM | POA: Diagnosis not present

## 2019-08-06 DIAGNOSIS — E8779 Other fluid overload: Secondary | ICD-10-CM | POA: Diagnosis not present

## 2019-08-06 DIAGNOSIS — I959 Hypotension, unspecified: Secondary | ICD-10-CM | POA: Diagnosis not present

## 2019-08-07 DIAGNOSIS — J441 Chronic obstructive pulmonary disease with (acute) exacerbation: Secondary | ICD-10-CM | POA: Diagnosis not present

## 2019-08-07 DIAGNOSIS — R269 Unspecified abnormalities of gait and mobility: Secondary | ICD-10-CM | POA: Diagnosis not present

## 2019-08-07 DIAGNOSIS — M6281 Muscle weakness (generalized): Secondary | ICD-10-CM | POA: Diagnosis not present

## 2019-08-07 DIAGNOSIS — R062 Wheezing: Secondary | ICD-10-CM | POA: Diagnosis not present

## 2019-08-08 DIAGNOSIS — E8779 Other fluid overload: Secondary | ICD-10-CM | POA: Diagnosis not present

## 2019-08-08 DIAGNOSIS — E0822 Diabetes mellitus due to underlying condition with diabetic chronic kidney disease: Secondary | ICD-10-CM | POA: Diagnosis not present

## 2019-08-08 DIAGNOSIS — R197 Diarrhea, unspecified: Secondary | ICD-10-CM | POA: Diagnosis not present

## 2019-08-08 DIAGNOSIS — D631 Anemia in chronic kidney disease: Secondary | ICD-10-CM | POA: Diagnosis not present

## 2019-08-08 DIAGNOSIS — I959 Hypotension, unspecified: Secondary | ICD-10-CM | POA: Diagnosis not present

## 2019-08-08 DIAGNOSIS — R52 Pain, unspecified: Secondary | ICD-10-CM | POA: Diagnosis not present

## 2019-08-08 DIAGNOSIS — D509 Iron deficiency anemia, unspecified: Secondary | ICD-10-CM | POA: Diagnosis not present

## 2019-08-08 DIAGNOSIS — E8770 Fluid overload, unspecified: Secondary | ICD-10-CM | POA: Diagnosis not present

## 2019-08-08 DIAGNOSIS — N186 End stage renal disease: Secondary | ICD-10-CM | POA: Diagnosis not present

## 2019-08-08 DIAGNOSIS — N2581 Secondary hyperparathyroidism of renal origin: Secondary | ICD-10-CM | POA: Diagnosis not present

## 2019-08-08 DIAGNOSIS — Z87891 Personal history of nicotine dependence: Secondary | ICD-10-CM | POA: Diagnosis not present

## 2019-08-08 DIAGNOSIS — J449 Chronic obstructive pulmonary disease, unspecified: Secondary | ICD-10-CM | POA: Diagnosis not present

## 2019-08-08 DIAGNOSIS — Z992 Dependence on renal dialysis: Secondary | ICD-10-CM | POA: Diagnosis not present

## 2019-08-10 DIAGNOSIS — E8779 Other fluid overload: Secondary | ICD-10-CM | POA: Diagnosis not present

## 2019-08-10 DIAGNOSIS — I959 Hypotension, unspecified: Secondary | ICD-10-CM | POA: Diagnosis not present

## 2019-08-10 DIAGNOSIS — R52 Pain, unspecified: Secondary | ICD-10-CM | POA: Diagnosis not present

## 2019-08-10 DIAGNOSIS — N2581 Secondary hyperparathyroidism of renal origin: Secondary | ICD-10-CM | POA: Diagnosis not present

## 2019-08-10 DIAGNOSIS — D509 Iron deficiency anemia, unspecified: Secondary | ICD-10-CM | POA: Diagnosis not present

## 2019-08-10 DIAGNOSIS — J449 Chronic obstructive pulmonary disease, unspecified: Secondary | ICD-10-CM | POA: Diagnosis not present

## 2019-08-10 DIAGNOSIS — R197 Diarrhea, unspecified: Secondary | ICD-10-CM | POA: Diagnosis not present

## 2019-08-10 DIAGNOSIS — Z87891 Personal history of nicotine dependence: Secondary | ICD-10-CM | POA: Diagnosis not present

## 2019-08-10 DIAGNOSIS — N186 End stage renal disease: Secondary | ICD-10-CM | POA: Diagnosis not present

## 2019-08-10 DIAGNOSIS — E0822 Diabetes mellitus due to underlying condition with diabetic chronic kidney disease: Secondary | ICD-10-CM | POA: Diagnosis not present

## 2019-08-10 DIAGNOSIS — Z992 Dependence on renal dialysis: Secondary | ICD-10-CM | POA: Diagnosis not present

## 2019-08-10 DIAGNOSIS — D631 Anemia in chronic kidney disease: Secondary | ICD-10-CM | POA: Diagnosis not present

## 2019-08-10 DIAGNOSIS — E8770 Fluid overload, unspecified: Secondary | ICD-10-CM | POA: Diagnosis not present

## 2019-08-11 DIAGNOSIS — E0822 Diabetes mellitus due to underlying condition with diabetic chronic kidney disease: Secondary | ICD-10-CM | POA: Diagnosis not present

## 2019-08-11 DIAGNOSIS — I959 Hypotension, unspecified: Secondary | ICD-10-CM | POA: Diagnosis not present

## 2019-08-11 DIAGNOSIS — R52 Pain, unspecified: Secondary | ICD-10-CM | POA: Diagnosis not present

## 2019-08-11 DIAGNOSIS — E8779 Other fluid overload: Secondary | ICD-10-CM | POA: Diagnosis not present

## 2019-08-11 DIAGNOSIS — Z87891 Personal history of nicotine dependence: Secondary | ICD-10-CM | POA: Diagnosis not present

## 2019-08-11 DIAGNOSIS — R197 Diarrhea, unspecified: Secondary | ICD-10-CM | POA: Diagnosis not present

## 2019-08-11 DIAGNOSIS — E8770 Fluid overload, unspecified: Secondary | ICD-10-CM | POA: Diagnosis not present

## 2019-08-11 DIAGNOSIS — N186 End stage renal disease: Secondary | ICD-10-CM | POA: Diagnosis not present

## 2019-08-11 DIAGNOSIS — Z992 Dependence on renal dialysis: Secondary | ICD-10-CM | POA: Diagnosis not present

## 2019-08-11 DIAGNOSIS — D509 Iron deficiency anemia, unspecified: Secondary | ICD-10-CM | POA: Diagnosis not present

## 2019-08-11 DIAGNOSIS — D631 Anemia in chronic kidney disease: Secondary | ICD-10-CM | POA: Diagnosis not present

## 2019-08-11 DIAGNOSIS — N2581 Secondary hyperparathyroidism of renal origin: Secondary | ICD-10-CM | POA: Diagnosis not present

## 2019-08-11 DIAGNOSIS — J449 Chronic obstructive pulmonary disease, unspecified: Secondary | ICD-10-CM | POA: Diagnosis not present

## 2019-08-12 DIAGNOSIS — L97812 Non-pressure chronic ulcer of other part of right lower leg with fat layer exposed: Secondary | ICD-10-CM | POA: Diagnosis not present

## 2019-08-12 DIAGNOSIS — T8789 Other complications of amputation stump: Secondary | ICD-10-CM | POA: Diagnosis not present

## 2019-08-12 DIAGNOSIS — L97312 Non-pressure chronic ulcer of right ankle with fat layer exposed: Secondary | ICD-10-CM | POA: Diagnosis not present

## 2019-08-13 ENCOUNTER — Encounter: Payer: Self-pay | Admitting: Cardiology

## 2019-08-13 DIAGNOSIS — J449 Chronic obstructive pulmonary disease, unspecified: Secondary | ICD-10-CM | POA: Diagnosis not present

## 2019-08-13 DIAGNOSIS — E8770 Fluid overload, unspecified: Secondary | ICD-10-CM | POA: Diagnosis not present

## 2019-08-13 DIAGNOSIS — E0822 Diabetes mellitus due to underlying condition with diabetic chronic kidney disease: Secondary | ICD-10-CM | POA: Diagnosis not present

## 2019-08-13 DIAGNOSIS — I959 Hypotension, unspecified: Secondary | ICD-10-CM | POA: Diagnosis not present

## 2019-08-13 DIAGNOSIS — I87312 Chronic venous hypertension (idiopathic) with ulcer of left lower extremity: Secondary | ICD-10-CM | POA: Diagnosis not present

## 2019-08-13 DIAGNOSIS — R197 Diarrhea, unspecified: Secondary | ICD-10-CM | POA: Diagnosis not present

## 2019-08-13 DIAGNOSIS — D509 Iron deficiency anemia, unspecified: Secondary | ICD-10-CM | POA: Diagnosis not present

## 2019-08-13 DIAGNOSIS — R52 Pain, unspecified: Secondary | ICD-10-CM | POA: Diagnosis not present

## 2019-08-13 DIAGNOSIS — Z992 Dependence on renal dialysis: Secondary | ICD-10-CM | POA: Diagnosis not present

## 2019-08-13 DIAGNOSIS — N186 End stage renal disease: Secondary | ICD-10-CM | POA: Diagnosis not present

## 2019-08-13 DIAGNOSIS — D631 Anemia in chronic kidney disease: Secondary | ICD-10-CM | POA: Diagnosis not present

## 2019-08-13 DIAGNOSIS — N2581 Secondary hyperparathyroidism of renal origin: Secondary | ICD-10-CM | POA: Diagnosis not present

## 2019-08-13 DIAGNOSIS — E8779 Other fluid overload: Secondary | ICD-10-CM | POA: Diagnosis not present

## 2019-08-13 DIAGNOSIS — Z87891 Personal history of nicotine dependence: Secondary | ICD-10-CM | POA: Diagnosis not present

## 2019-08-13 NOTE — Progress Notes (Signed)
Remote pacemaker transmission.   

## 2019-08-14 ENCOUNTER — Emergency Department (HOSPITAL_COMMUNITY): Payer: Medicare Other

## 2019-08-14 ENCOUNTER — Encounter (HOSPITAL_COMMUNITY): Payer: Self-pay | Admitting: Emergency Medicine

## 2019-08-14 ENCOUNTER — Inpatient Hospital Stay (HOSPITAL_COMMUNITY)
Admission: EM | Admit: 2019-08-14 | Discharge: 2019-08-20 | DRG: 291 | Disposition: A | Discharge status: Home / Self Care | Payer: Medicare Other | Attending: Family Medicine | Admitting: Family Medicine

## 2019-08-14 ENCOUNTER — Encounter: Payer: Medicare Other | Admitting: Nurse Practitioner

## 2019-08-14 ENCOUNTER — Other Ambulatory Visit: Payer: Self-pay

## 2019-08-14 DIAGNOSIS — Z833 Family history of diabetes mellitus: Secondary | ICD-10-CM

## 2019-08-14 DIAGNOSIS — E785 Hyperlipidemia, unspecified: Secondary | ICD-10-CM | POA: Diagnosis present

## 2019-08-14 DIAGNOSIS — Z9981 Dependence on supplemental oxygen: Secondary | ICD-10-CM

## 2019-08-14 DIAGNOSIS — Y92019 Unspecified place in single-family (private) house as the place of occurrence of the external cause: Secondary | ICD-10-CM

## 2019-08-14 DIAGNOSIS — I340 Nonrheumatic mitral (valve) insufficiency: Secondary | ICD-10-CM | POA: Diagnosis not present

## 2019-08-14 DIAGNOSIS — I878 Other specified disorders of veins: Secondary | ICD-10-CM | POA: Diagnosis present

## 2019-08-14 DIAGNOSIS — M25551 Pain in right hip: Secondary | ICD-10-CM | POA: Diagnosis present

## 2019-08-14 DIAGNOSIS — Z792 Long term (current) use of antibiotics: Secondary | ICD-10-CM

## 2019-08-14 DIAGNOSIS — Z87891 Personal history of nicotine dependence: Secondary | ICD-10-CM

## 2019-08-14 DIAGNOSIS — Z89612 Acquired absence of left leg above knee: Secondary | ICD-10-CM

## 2019-08-14 DIAGNOSIS — Z955 Presence of coronary angioplasty implant and graft: Secondary | ICD-10-CM

## 2019-08-14 DIAGNOSIS — R55 Syncope and collapse: Secondary | ICD-10-CM | POA: Diagnosis present

## 2019-08-14 DIAGNOSIS — J9601 Acute respiratory failure with hypoxia: Secondary | ICD-10-CM

## 2019-08-14 DIAGNOSIS — I2582 Chronic total occlusion of coronary artery: Secondary | ICD-10-CM | POA: Diagnosis present

## 2019-08-14 DIAGNOSIS — E875 Hyperkalemia: Secondary | ICD-10-CM | POA: Diagnosis present

## 2019-08-14 DIAGNOSIS — Z23 Encounter for immunization: Secondary | ICD-10-CM

## 2019-08-14 DIAGNOSIS — E041 Nontoxic single thyroid nodule: Secondary | ICD-10-CM | POA: Diagnosis not present

## 2019-08-14 DIAGNOSIS — Z7902 Long term (current) use of antithrombotics/antiplatelets: Secondary | ICD-10-CM

## 2019-08-14 DIAGNOSIS — Z952 Presence of prosthetic heart valve: Secondary | ICD-10-CM

## 2019-08-14 DIAGNOSIS — I120 Hypertensive chronic kidney disease with stage 5 chronic kidney disease or end stage renal disease: Secondary | ICD-10-CM | POA: Diagnosis not present

## 2019-08-14 DIAGNOSIS — Z950 Presence of cardiac pacemaker: Secondary | ICD-10-CM

## 2019-08-14 DIAGNOSIS — E1151 Type 2 diabetes mellitus with diabetic peripheral angiopathy without gangrene: Secondary | ICD-10-CM | POA: Diagnosis present

## 2019-08-14 DIAGNOSIS — I2510 Atherosclerotic heart disease of native coronary artery without angina pectoris: Secondary | ICD-10-CM | POA: Diagnosis present

## 2019-08-14 DIAGNOSIS — I953 Hypotension of hemodialysis: Secondary | ICD-10-CM | POA: Diagnosis present

## 2019-08-14 DIAGNOSIS — I480 Paroxysmal atrial fibrillation: Secondary | ICD-10-CM | POA: Diagnosis not present

## 2019-08-14 DIAGNOSIS — Z7901 Long term (current) use of anticoagulants: Secondary | ICD-10-CM

## 2019-08-14 DIAGNOSIS — Z89512 Acquired absence of left leg below knee: Secondary | ICD-10-CM | POA: Diagnosis not present

## 2019-08-14 DIAGNOSIS — J449 Chronic obstructive pulmonary disease, unspecified: Secondary | ICD-10-CM | POA: Diagnosis not present

## 2019-08-14 DIAGNOSIS — R0602 Shortness of breath: Secondary | ICD-10-CM

## 2019-08-14 DIAGNOSIS — I959 Hypotension, unspecified: Secondary | ICD-10-CM | POA: Diagnosis present

## 2019-08-14 DIAGNOSIS — Z7189 Other specified counseling: Secondary | ICD-10-CM

## 2019-08-14 DIAGNOSIS — I871 Compression of vein: Secondary | ICD-10-CM | POA: Diagnosis not present

## 2019-08-14 DIAGNOSIS — E1165 Type 2 diabetes mellitus with hyperglycemia: Secondary | ICD-10-CM | POA: Diagnosis not present

## 2019-08-14 DIAGNOSIS — T82858A Stenosis of vascular prosthetic devices, implants and grafts, initial encounter: Secondary | ICD-10-CM | POA: Diagnosis not present

## 2019-08-14 DIAGNOSIS — Z823 Family history of stroke: Secondary | ICD-10-CM

## 2019-08-14 DIAGNOSIS — R4020 Unspecified coma: Secondary | ICD-10-CM | POA: Diagnosis not present

## 2019-08-14 DIAGNOSIS — R0902 Hypoxemia: Secondary | ICD-10-CM | POA: Diagnosis not present

## 2019-08-14 DIAGNOSIS — Z953 Presence of xenogenic heart valve: Secondary | ICD-10-CM | POA: Diagnosis not present

## 2019-08-14 DIAGNOSIS — Z9114 Patient's other noncompliance with medication regimen: Secondary | ICD-10-CM

## 2019-08-14 DIAGNOSIS — I5021 Acute systolic (congestive) heart failure: Secondary | ICD-10-CM | POA: Diagnosis not present

## 2019-08-14 DIAGNOSIS — I132 Hypertensive heart and chronic kidney disease with heart failure and with stage 5 chronic kidney disease, or end stage renal disease: Secondary | ICD-10-CM | POA: Diagnosis not present

## 2019-08-14 DIAGNOSIS — Z9049 Acquired absence of other specified parts of digestive tract: Secondary | ICD-10-CM

## 2019-08-14 DIAGNOSIS — R4182 Altered mental status, unspecified: Secondary | ICD-10-CM | POA: Diagnosis present

## 2019-08-14 DIAGNOSIS — I9589 Other hypotension: Secondary | ICD-10-CM | POA: Diagnosis not present

## 2019-08-14 DIAGNOSIS — I5043 Acute on chronic combined systolic (congestive) and diastolic (congestive) heart failure: Secondary | ICD-10-CM | POA: Diagnosis not present

## 2019-08-14 DIAGNOSIS — W19XXXA Unspecified fall, initial encounter: Secondary | ICD-10-CM | POA: Diagnosis present

## 2019-08-14 DIAGNOSIS — Z515 Encounter for palliative care: Secondary | ICD-10-CM | POA: Diagnosis not present

## 2019-08-14 DIAGNOSIS — I252 Old myocardial infarction: Secondary | ICD-10-CM

## 2019-08-14 DIAGNOSIS — N186 End stage renal disease: Secondary | ICD-10-CM | POA: Diagnosis present

## 2019-08-14 DIAGNOSIS — Z8711 Personal history of peptic ulcer disease: Secondary | ICD-10-CM

## 2019-08-14 DIAGNOSIS — Z794 Long term (current) use of insulin: Secondary | ICD-10-CM

## 2019-08-14 DIAGNOSIS — Z992 Dependence on renal dialysis: Secondary | ICD-10-CM

## 2019-08-14 DIAGNOSIS — N189 Chronic kidney disease, unspecified: Secondary | ICD-10-CM | POA: Diagnosis not present

## 2019-08-14 DIAGNOSIS — I5042 Chronic combined systolic (congestive) and diastolic (congestive) heart failure: Secondary | ICD-10-CM | POA: Diagnosis present

## 2019-08-14 DIAGNOSIS — I4821 Permanent atrial fibrillation: Secondary | ICD-10-CM | POA: Diagnosis not present

## 2019-08-14 DIAGNOSIS — I361 Nonrheumatic tricuspid (valve) insufficiency: Secondary | ICD-10-CM | POA: Diagnosis not present

## 2019-08-14 DIAGNOSIS — I951 Orthostatic hypotension: Secondary | ICD-10-CM | POA: Diagnosis present

## 2019-08-14 DIAGNOSIS — I472 Ventricular tachycardia: Secondary | ICD-10-CM | POA: Diagnosis not present

## 2019-08-14 DIAGNOSIS — D631 Anemia in chronic kidney disease: Secondary | ICD-10-CM | POA: Diagnosis not present

## 2019-08-14 DIAGNOSIS — R404 Transient alteration of awareness: Secondary | ICD-10-CM | POA: Diagnosis not present

## 2019-08-14 DIAGNOSIS — J9621 Acute and chronic respiratory failure with hypoxia: Secondary | ICD-10-CM | POA: Diagnosis not present

## 2019-08-14 DIAGNOSIS — F40240 Claustrophobia: Secondary | ICD-10-CM | POA: Diagnosis present

## 2019-08-14 DIAGNOSIS — I255 Ischemic cardiomyopathy: Secondary | ICD-10-CM | POA: Diagnosis present

## 2019-08-14 DIAGNOSIS — I442 Atrioventricular block, complete: Secondary | ICD-10-CM | POA: Diagnosis not present

## 2019-08-14 DIAGNOSIS — E1122 Type 2 diabetes mellitus with diabetic chronic kidney disease: Secondary | ICD-10-CM | POA: Diagnosis present

## 2019-08-14 DIAGNOSIS — Z79899 Other long term (current) drug therapy: Secondary | ICD-10-CM

## 2019-08-14 DIAGNOSIS — Z20828 Contact with and (suspected) exposure to other viral communicable diseases: Secondary | ICD-10-CM | POA: Diagnosis present

## 2019-08-14 DIAGNOSIS — N2581 Secondary hyperparathyroidism of renal origin: Secondary | ICD-10-CM | POA: Diagnosis present

## 2019-08-14 DIAGNOSIS — I5023 Acute on chronic systolic (congestive) heart failure: Secondary | ICD-10-CM | POA: Diagnosis not present

## 2019-08-14 DIAGNOSIS — Z8249 Family history of ischemic heart disease and other diseases of the circulatory system: Secondary | ICD-10-CM

## 2019-08-14 DIAGNOSIS — Z8673 Personal history of transient ischemic attack (TIA), and cerebral infarction without residual deficits: Secondary | ICD-10-CM

## 2019-08-14 DIAGNOSIS — IMO0002 Reserved for concepts with insufficient information to code with codable children: Secondary | ICD-10-CM | POA: Diagnosis present

## 2019-08-14 DIAGNOSIS — Z820 Family history of epilepsy and other diseases of the nervous system: Secondary | ICD-10-CM

## 2019-08-14 DIAGNOSIS — Z885 Allergy status to narcotic agent status: Secondary | ICD-10-CM

## 2019-08-14 DIAGNOSIS — Z7951 Long term (current) use of inhaled steroids: Secondary | ICD-10-CM

## 2019-08-14 DIAGNOSIS — E8889 Other specified metabolic disorders: Secondary | ICD-10-CM | POA: Diagnosis present

## 2019-08-14 DIAGNOSIS — Z888 Allergy status to other drugs, medicaments and biological substances status: Secondary | ICD-10-CM

## 2019-08-14 DIAGNOSIS — R197 Diarrhea, unspecified: Secondary | ICD-10-CM | POA: Diagnosis present

## 2019-08-14 DIAGNOSIS — R54 Age-related physical debility: Secondary | ICD-10-CM | POA: Diagnosis present

## 2019-08-14 DIAGNOSIS — L8990 Pressure ulcer of unspecified site, unspecified stage: Secondary | ICD-10-CM | POA: Insufficient documentation

## 2019-08-14 DIAGNOSIS — E118 Type 2 diabetes mellitus with unspecified complications: Secondary | ICD-10-CM | POA: Diagnosis not present

## 2019-08-14 LAB — SARS CORONAVIRUS 2 BY RT PCR (HOSPITAL ORDER, PERFORMED IN ~~LOC~~ HOSPITAL LAB): SARS Coronavirus 2: NEGATIVE

## 2019-08-14 LAB — COMPREHENSIVE METABOLIC PANEL
ALT: 20 U/L (ref 0–44)
AST: 33 U/L (ref 15–41)
Albumin: 3 g/dL — ABNORMAL LOW (ref 3.5–5.0)
Alkaline Phosphatase: 135 U/L — ABNORMAL HIGH (ref 38–126)
Anion gap: 18 — ABNORMAL HIGH (ref 5–15)
BUN: 35 mg/dL — ABNORMAL HIGH (ref 8–23)
CO2: 19 mmol/L — ABNORMAL LOW (ref 22–32)
Calcium: 8.7 mg/dL — ABNORMAL LOW (ref 8.9–10.3)
Chloride: 97 mmol/L — ABNORMAL LOW (ref 98–111)
Creatinine, Ser: 5.19 mg/dL — ABNORMAL HIGH (ref 0.61–1.24)
GFR calc Af Amer: 11 mL/min — ABNORMAL LOW (ref >60)
GFR calc non Af Amer: 10 mL/min — ABNORMAL LOW (ref >60)
Glucose, Bld: 219 mg/dL — ABNORMAL HIGH (ref 70–99)
Potassium: 6.1 mmol/L — ABNORMAL HIGH (ref 3.5–5.1)
Sodium: 134 mmol/L — ABNORMAL LOW (ref 135–145)
Total Bilirubin: 2 mg/dL — ABNORMAL HIGH (ref 0.3–1.2)
Total Protein: 6.4 g/dL — ABNORMAL LOW (ref 6.5–8.1)

## 2019-08-14 LAB — POCT I-STAT EG7
Acid-base deficit: 6 mmol/L — ABNORMAL HIGH (ref 0.0–2.0)
Bicarbonate: 19.5 mmol/L — ABNORMAL LOW (ref 20.0–28.0)
Calcium, Ion: 0.97 mmol/L — ABNORMAL LOW (ref 1.15–1.40)
HCT: 38 % — ABNORMAL LOW (ref 39.0–52.0)
Hemoglobin: 12.9 g/dL — ABNORMAL LOW (ref 13.0–17.0)
O2 Saturation: 99 %
Potassium: 5.8 mmol/L — ABNORMAL HIGH (ref 3.5–5.1)
Sodium: 133 mmol/L — ABNORMAL LOW (ref 135–145)
TCO2: 21 mmol/L — ABNORMAL LOW (ref 22–32)
pCO2, Ven: 36.5 mmHg — ABNORMAL LOW (ref 44.0–60.0)
pH, Ven: 7.336 (ref 7.250–7.430)
pO2, Ven: 152 mmHg — ABNORMAL HIGH (ref 32.0–45.0)

## 2019-08-14 LAB — CBC WITH DIFFERENTIAL/PLATELET
Abs Immature Granulocytes: 0.03 10*3/uL (ref 0.00–0.07)
Basophils Absolute: 0 10*3/uL (ref 0.0–0.1)
Basophils Relative: 1 %
Eosinophils Absolute: 0.3 10*3/uL (ref 0.0–0.5)
Eosinophils Relative: 4 %
HCT: 37.9 % — ABNORMAL LOW (ref 39.0–52.0)
Hemoglobin: 11.9 g/dL — ABNORMAL LOW (ref 13.0–17.0)
Immature Granulocytes: 0 %
Lymphocytes Relative: 14 %
Lymphs Abs: 1 10*3/uL (ref 0.7–4.0)
MCH: 30.9 pg (ref 26.0–34.0)
MCHC: 31.4 g/dL (ref 30.0–36.0)
MCV: 98.4 fL (ref 80.0–100.0)
Monocytes Absolute: 0.9 10*3/uL (ref 0.1–1.0)
Monocytes Relative: 12 %
Neutro Abs: 4.7 10*3/uL (ref 1.7–7.7)
Neutrophils Relative %: 69 %
Platelets: 172 10*3/uL (ref 150–400)
RBC: 3.85 MIL/uL — ABNORMAL LOW (ref 4.22–5.81)
RDW: 16 % — ABNORMAL HIGH (ref 11.5–15.5)
WBC: 6.9 10*3/uL (ref 4.0–10.5)
nRBC: 0 % (ref 0.0–0.2)

## 2019-08-14 LAB — TROPONIN I (HIGH SENSITIVITY): Troponin I (High Sensitivity): 48 ng/L — ABNORMAL HIGH (ref <18)

## 2019-08-14 LAB — BRAIN NATRIURETIC PEPTIDE: B Natriuretic Peptide: 1710.9 pg/mL — ABNORMAL HIGH (ref 0.0–100.0)

## 2019-08-14 MED ORDER — SODIUM ZIRCONIUM CYCLOSILICATE 10 G PO PACK
10.0000 g | PACK | Freq: Every day | ORAL | Status: DC
  Administered 2019-08-14: 10 g via ORAL
  Filled 2019-08-14 (×2): qty 1

## 2019-08-14 MED ORDER — DEXTROSE 50 % IV SOLN
1.0000 | Freq: Once | INTRAVENOUS | Status: AC
  Administered 2019-08-14: 50 mL via INTRAVENOUS

## 2019-08-14 MED ORDER — DEXTROSE 50 % IV SOLN
INTRAVENOUS | Status: AC
  Filled 2019-08-14: qty 50

## 2019-08-14 MED ORDER — ALBUTEROL SULFATE HFA 108 (90 BASE) MCG/ACT IN AERS
4.0000 | INHALATION_SPRAY | Freq: Once | RESPIRATORY_TRACT | Status: AC
  Administered 2019-08-14: 22:00:00 4 via RESPIRATORY_TRACT
  Filled 2019-08-14: qty 6.7

## 2019-08-14 MED ORDER — INSULIN ASPART 100 UNIT/ML ~~LOC~~ SOLN
0.0000 [IU] | SUBCUTANEOUS | Status: DC
  Administered 2019-08-15: 2 [IU] via SUBCUTANEOUS
  Administered 2019-08-15: 3 [IU] via SUBCUTANEOUS

## 2019-08-14 MED ORDER — CALCIUM GLUCONATE-NACL 1-0.675 GM/50ML-% IV SOLN
1.0000 g | Freq: Once | INTRAVENOUS | Status: AC
  Administered 2019-08-14: 22:00:00 1000 mg via INTRAVENOUS
  Filled 2019-08-14: qty 50

## 2019-08-14 MED ORDER — IOHEXOL 350 MG/ML SOLN
75.0000 mL | Freq: Once | INTRAVENOUS | Status: AC | PRN
  Administered 2019-08-14: 75 mL via INTRAVENOUS

## 2019-08-14 MED ORDER — INSULIN ASPART 100 UNIT/ML IV SOLN
5.0000 [IU] | Freq: Once | INTRAVENOUS | Status: AC
  Administered 2019-08-14: 5 [IU] via INTRAVENOUS

## 2019-08-14 MED ORDER — SODIUM BICARBONATE 8.4 % IV SOLN
50.0000 meq | Freq: Once | INTRAVENOUS | Status: AC
  Administered 2019-08-14: 50 meq via INTRAVENOUS
  Filled 2019-08-14 (×3): qty 50

## 2019-08-14 NOTE — ED Triage Notes (Signed)
Pt brought to ED by [facility] for c/o hypotension, SOB and low O2 saturation, HD pt, last treatment yesterday with no problem, pt got a procedure on his fistula to remove a blood clot. Pt was found lethargic by daughter around 18:30, pt is O2 home dependent at home on 2 L found by EMS to be in the low 70 -80%, placed on NRM up to 100%, initial BP 80/70, 330 mL NS bolus given and BP up to 98/64 V paced on the monitor on the 50's cbg 275

## 2019-08-14 NOTE — H&P (Addendum)
NAME:  Jeffrey [name], MRN:  [id number], DOB:  01/26/1941, LOS: 0 ADMISSION DATE:  08/14/2019, CONSULTATION DATE:  08/14/19 REFERRING MD:  tegeler - EM, CHIEF COMPLAINT:  SOB CP  Brief History   78 yo M with intermittent SOB and chest pain, in ED on NRB. Hx ESRD with T/R/Sa HD; fistula recently clotted but per patient is patient and intended for use Saturday.  PCCM consulted for admission, ? iHD cath placement if CRRT needed this evening.   History of present illness   78 yo M PMH  ESRD (M/T/R/Sa HD), CVA, CAD, CHF, s/p TAVR, A fib, DM II, who presents to ED 9/18 after noted by daughter to exhibit decreased level of consciousness. Daughter reports that the patient had several "episodes" of SOB and intermittent chest pain. During one such episode, he exhibited AMS, decreased level of consciousness and daughter dispatched EMS. Patient transported to ED, placed on NRB. Denies productive cough, rhinitis, wheezing. Denies fever, chills, nightsweats. Chest pain is mild 3/10 intermittent without identifiable factors which improve or worsen pain. Denies weight loss, n/v/d/c.   Patient noted to be hyperkalemic 6.1 for which bicarb, insulin/dextrose, albuterol ordered.   Per patient and daughter, patient has not missed a dialysis session. Thursday at dialysis, 4L reportedly removed. Patient and daughter reference recent blood clot in fistula. Reportedly, this was resolved and patient/daughter state that fistula is intended for use tomorrow at regularly scheduled dialysis.   A CT angio chest has been ordered to r/o PE but not yet completed. Nephrology has been consulted but not yet seen.   Patient's SBP have been soft in ED, 80s-100. PCCM consulted for evaluation of possible admission, iHD catheter placement.   Past Medical History  Angina Aortic Stenosis CHF Claustrophobia CAD CVA COPD ESRD Gastric Ulcer HLD HTN MI PVD S/p Pacemaker S/p TAVR DM II   Significant Hospital Events   9/18>  present to ED   Consults:  Nephrology PCCM  Procedures:    Significant Diagnostic Tests:  9/19 CT angio chest>>>  Micro Data:  9/18 SARS CoV2> neg  Antimicrobials:  na  Interim history/subjective:  Blood pressure and mentation have improved in ED   Objective   Blood pressure (!) 87/49, pulse (!) 57, temperature 98.8 F (37.1 C), temperature source Rectal, resp. rate 15, height 5\' 11"  (1.803 m), weight 95.3 kg, SpO2 93 %.        Intake/Output Summary (Last 24 hours) at 08/15/2019 0014 Last data filed at 08/14/2019 2307 Gross per 24 hour  Intake 50 ml  Output -  Net 50 ml   Filed Weights   08/14/19 2008  Weight: 95.3 kg    Examination: General: Chronically ill appearing older adult M, seated on edge of bed HENT: NCAT. NRB in place. Mild scleral icterus. Trachea midline   Lungs: Diminished bib Cardiovascular: Bradycardic rate. S1s2. Cap refill < 3 sec BUE Abdomen: Soft, round, scarring from prior abdominal surgeries apparent. + bowel sounds  Extremities: L BKA. LUE AVF.  Neuro: AAO x3, following commands, PERRL  GU: defer Skin: Scattered petechiae on chest, abdomen, scattered scabbed wounds on chest.    Resolved Hospital Problem list     Assessment & Plan:   Acute Hypoxic Respiratory Failure -CXR without overt pulmonary edema, does reveal cardiomegaly and vascular congestion -CT angio chest pending to r/o PE-- thought process to r/o in setting of recent AVF clot -Hx COPD -- does not have features of AECOPD P Follow up CT angio chest. If PE will  consult pharmacy for anticoagulation  Titrate Supplemental O2 for SpO2 for 88-92% BDs  ESRD -T/R/Sa Dialysis. LUE AVF, recently clotted. Patient and daughter state clot was "removed and they stented it." Per patient/daughter, LUE planned for use for 9/19.  Hyperkalemia -Given insulin dextrose bicarb albuterol lokelma P Nephrology has been consulted, appreciate recs RE dialysis, evaluation of fistula Continue   midodrine  Check BMP AM   Hypotension Patient mentation is appropriate with SBP 80-100  P Midodrine as above Continue tele MAP goal > 60 if mentation intact   CHF CAD A fib on home eliquis HLD P Continue Tele Continue ASA, plavix, eliquis Hold statin  Hold metop   Claustrophobia  P Encourage positioning for patient comfort and anxiety reduction  Home Atarax  DM P  SSI    Best practice:  Diet: NPO  Pain/Anxiety/Delirium protocol (if indicated): na VAP protocol (if indicated): na DVT prophylaxis: SCD  GI prophylaxis: pepcid  Glucose control: SSI  Mobility: Assist  Code Status: Full  Family Communication: Daughter and patient updated Disposition: Admit to ICU   Labs   CBC: Recent Labs  Lab 08/14/19 2016 08/14/19 2046  WBC 6.9  --   NEUTROABS 4.7  --   HGB 11.9* 12.9*  HCT 37.9* 38.0*  MCV 98.4  --   PLT 172  --     Basic Metabolic Panel: Recent Labs  Lab 08/14/19 2016 08/14/19 2046  NA 134* 133*  K 6.1* 5.8*  CL 97*  --   CO2 19*  --   GLUCOSE 219*  --   BUN 35*  --   CREATININE 5.19*  --   CALCIUM 8.7*  --    GFR: Estimated Creatinine Clearance: 13.4 mL/min (A) (by C-G formula based on SCr of 5.19 mg/dL (H)). Recent Labs  Lab 08/14/19 2016  WBC 6.9    Liver Function Tests: Recent Labs  Lab 08/14/19 2016  AST 33  ALT 20  ALKPHOS 135*  BILITOT 2.0*  PROT 6.4*  ALBUMIN 3.0*   No results for input(s): LIPASE, AMYLASE in the last 168 hours. No results for input(s): AMMONIA in the last 168 hours.  ABG    Component Value Date/Time   PHART 7.473 (H) 05/01/2017 1339   PCO2ART 41.1 05/01/2017 1339   PO2ART 78.0 (L) 05/01/2017 1339   HCO3 19.5 (L) 08/14/2019 2046   TCO2 21 (L) 08/14/2019 2046   ACIDBASEDEF 6.0 (H) 08/14/2019 2046   O2SAT 99.0 08/14/2019 2046     Coagulation Profile: No results for input(s): INR, PROTIME in the last 168 hours.  Cardiac Enzymes: No results for input(s): CKTOTAL, CKMB, CKMBINDEX, TROPONINI in  the last 168 hours.  HbA1C: Hgb A1c MFr Bld  Date/Time Value Ref Range Status  04/11/2019 05:14 AM 8.4 (H) 4.8 - 5.6 % Final    Comment:    (NOTE) Pre diabetes:          5.7%-6.4% Diabetes:              >6.4% Glycemic control for   <7.0% adults with diabetes   01/07/2019 11:08 AM 6.3 (H) 4.8 - 5.6 % Final    Comment:             Prediabetes: 5.7 - 6.4          Diabetes: >6.4          Glycemic control for adults with diabetes: <7.0     CBG: No results for input(s): GLUCAP in the last 168 hours.  Review  of Systems:   As per HPI   Past Medical History  He,  has a past medical history of Anginal pain ([place]), Aortic stenosis, Atrial fibrillation ([place]), CHF (congestive heart failure) ([place]), Claustrophobia, Complete heart block ([place]), COPD (chronic obstructive pulmonary disease) ([place]), Coronary artery disease, CVA (cerebral vascular accident) ([place]) (10/2014), ESRD (end stage renal disease) on dialysis [facility]), History of blood transfusion, History of stomach ulcers, Hyperlipidemia, Hypertension, Iron deficiency anemia, Myocardial infarction ([place]) (10/2014), Peripheral vascular disease ([place]), Pneumonia, Presence of permanent cardiac pacemaker, 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   Surgical History    Past Surgical History:  Procedure Laterality Date  . ABDOMINAL AORTOGRAM W/LOWER EXTREMITY Bilateral 03/27/2019   Procedure: ABDOMINAL AORTOGRAM W/LOWER EXTREMITY;  Surgeon: Angelia Mould, MD;  Location: [place] [facility];  Service: Cardiovascular;  Laterality: Bilateral;  . AMPUTATION Left 04/14/2019   Procedure: AMPUTATION BELOW KNEE;  Surgeon: Angelia Mould, MD;  Location: [place];  Service: Vascular;  Laterality: Left;  . AV FISTULA PLACEMENT Left 10/19/2014   Procedure: BRACHIOCEPHALIC ARTERIOVENOUS (AV) FISTULA CREATION ;  Surgeon: Conrad Reynolds, MD;  Location: [place];  Service: Vascular;  Laterality: Left;  .  CARDIAC CATHETERIZATION    . CARDIAC CATHETERIZATION N/A 07/22/2015   Procedure: Right/Left Heart Cath and Coronary Angiography;  Surgeon: [name], MD;  Location: [place] [facility];  Service: Cardiovascular;  Laterality: N/A;  . CATARACT EXTRACTION W/ INTRAOCULAR LENS  IMPLANT, BILATERAL Bilateral 1990's  . CHOLECYSTECTOMY OPEN  1980's  . COLONOSCOPY W/ BIOPSIES AND POLYPECTOMY    . CORONARY ANGIOGRAPHY N/A 07/31/2018   Procedure: CORONARY ANGIOGRAPHY;  Surgeon: Martinique, Peter M, MD;  Location: [place] [facility];  Service: Cardiovascular;  Laterality: N/A;  . CORONARY ANGIOPLASTY    . CORONARY ATHERECTOMY N/A 04/18/2018   Procedure: CORONARY ATHERECTOMY;  Surgeon: [name], MD;  Location: [place] [facility];  Service: Cardiovascular;  Laterality: N/A;  . CORONARY BALLOON ANGIOPLASTY N/A 07/31/2018   Procedure: CORONARY BALLOON ANGIOPLASTY;  Surgeon: Martinique, Peter M, MD;  Location: [place] [facility];  Service: Cardiovascular;  Laterality: N/A;  . CORONARY STENT INTERVENTION N/A 04/18/2018   Procedure: CORONARY STENT INTERVENTION;  Surgeon: [name], MD;  Location: [place] [facility];  Service: Cardiovascular;  Laterality: N/A;  . EP IMPLANTABLE DEVICE N/A 07/11/2015   Procedure: Pacemaker Implant;  Surgeon: [name], MD;  Location: [place] [facility];  Service: Cardiovascular;  Laterality: N/A;  . ESOPHAGOGASTRODUODENOSCOPY  08/01/2012   Procedure: ESOPHAGOGASTRODUODENOSCOPY (EGD);  Surgeon: [name], MD;  Location: [facility];  Service: Endoscopy;  Laterality: N/A;  . INSERT / REPLACE / REMOVE PACEMAKER  07/11/2015  . INSERTION OF DIALYSIS CATHETER Right 02/02/2015   Procedure: INSERTION OF DIALYSIS CATHETER  RIGHT INTERNAL JUGULAR;  Surgeon: Mal [name], MD;  Location: [place];  Service: Vascular;  Laterality: Right;  . LEFT AND RIGHT HEART CATHETERIZATION WITH CORONARY ANGIOGRAM N/A 09/30/2014   Procedure: LEFT AND RIGHT HEART  CATHETERIZATION WITH CORONARY ANGIOGRAM;  Surgeon: [name], MD;  Location: [facility];  Service: Cardiovascular;  Laterality: N/A;  . LEFT HEART CATH AND CORONARY ANGIOGRAPHY N/A 04/14/2018   Procedure: LEFT HEART CATH AND CORONARY ANGIOGRAPHY;  Surgeon: [name], MD;  Location: [place] [facility];  Service: Cardiovascular;  Laterality: N/A;  . TEE WITHOUT CARDIOVERSION N/A 08/02/2015   Procedure: TRANSESOPHAGEAL ECHOCARDIOGRAM (TEE);  Surgeon: [name], MD;  Location:  [place] [facility];  Service: Open Heart Surgery;  Laterality: N/A;  . TONSILLECTOMY    . TRANSCATHETER AORTIC VALVE REPLACEMENT, TRANSFEMORAL Left 08/02/2015   Procedure: TRANSCATHETER AORTIC VALVE REPLACEMENT, TRANSFEMORAL;  Surgeon: [name], MD;  Location: [place];  Service: Open Heart Surgery;  Laterality: Left;     Social History   reports that he quit smoking about 35 years ago. He has a 64.00 pack-year smoking history. He has never used smokeless tobacco. He reports current alcohol use. He reports that he does not use drugs.   Family History   His family history includes Alzheimer's disease in his maternal aunt and mother; Cancer in his maternal uncle; Diabetes in his father, paternal grandmother, and sister; Heart disease in his father; Stroke in his mother.   Allergies Allergies  Allergen Reactions  . Byetta 10 Mcg Pen [Exenatide] Diarrhea and Nausea And Vomiting  . Codeine Itching  . Coumadin [Warfarin Sodium] Rash    (wife states coumadin was stopped but rash did not disappear 02/21/19)     Home Medications  Prior to Admission medications   Medication Sig Start Date End Date Taking? Authorizing Provider  acetaminophen (TYLENOL) 500 MG tablet Take 1 tablet (500 mg total) by mouth every 6 (six) hours as needed for mild pain or headache (pain). 04/18/19   Pokhrel, Corrie Mckusick, MD  albuterol (PROVENTIL HFA;VENTOLIN HFA) 108 (90 Base) MCG/ACT inhaler Inhale 2 puffs into the lungs every 4 (four) hours as needed for  wheezing or shortness of breath. 11/27/18   Tanda Rockers, MD  albuterol (PROVENTIL) (2.5 MG/3ML) 0.083% nebulizer solution Take 3 mLs (2.5 mg total) by nebulization every 6 (six) hours as needed for wheezing or shortness of breath. 01/07/19   Scot Jun, FNP  ALPRAZolam Duanne Moron) 0.25 MG tablet Take 1 tablet (0.25 mg total) by mouth 2 (two) times daily as needed for anxiety. 02/24/19   Norval Morton, MD  apixaban (ELIQUIS) 2.5 MG TABS tablet Take 1 tablet (2.5 mg total) by mouth 2 (two) times daily. 04/29/19   Deboraha Sprang, MD  atorvastatin (LIPITOR) 80 MG tablet Take 1 tablet (80 mg total) by mouth daily at 6 PM. 01/07/19   Scot Jun, FNP  budesonide-formoterol [facility]) 160-4.5 MCG/ACT inhaler Inhale 2 puffs into the lungs 2 (two) times daily as needed.    [provider]  cephALEXin (KEFLEX) 500 MG capsule Take 1 capsule (500 mg total) by mouth 3 (three) times daily. 04/29/19   Angelia Mould, MD  cinacalcet (SENSIPAR) 30 MG tablet Take 30 mg by mouth See admin instructions. Take one tablet (30 mg) by mouth on Monday, Tuesday, Thursday, Saturday after dialysis    [provider]  clopidogrel (PLAVIX) 75 MG tablet Take 1 tablet (75 mg total) by mouth daily. 04/29/19   Deboraha Sprang, MD  docusate sodium (COLACE) 100 MG capsule Take 1 capsule (100 mg total) by mouth every 12 (twelve) hours. Take 2 tablets per day until you have a bowel movement, then 1 per day until soft. 04/19/19   Montine Circle, PA-C  doxercalciferol (HECTOROL) 4 MCG/2ML injection Inject 0.5 mLs (1 mcg total) into the vein Every Tuesday,Thursday,and Saturday with dialysis. 02/26/19   Norval Morton, MD  guaiFENesin (MUCINEX) 600 MG 12 hr tablet Take 1 tablet (600 mg total) by mouth 2 (two) times daily. 01/07/19   Scot Jun, FNP  hydrOXYzine (ATARAX/VISTARIL) 25 MG tablet Take 1 tablet (25 mg total) by mouth every 8 (eight) hours as  needed for anxiety. 04/22/19   Scot Jun,  FNP  insulin detemir (LEVEMIR) 100 UNIT/ML injection Inject 0.2 mLs (20 Units total) into the skin at bedtime. 04/18/19   Pokhrel, Laxman, MD  Insulin Pen Needle 31G X 6 MM MISC at bedtime.  08/28/18   [provider]  LEVEMIR FLEXTOUCH 100 UNIT/ML Pen INJECT 48 UNITS TOTAL INTO THE SKIN AT BEDTIME. 07/17/19   Charlott Rakes, MD  lidocaine-prilocaine (EMLA) cream Apply 1 application topically See admin instructions. Apply topically prior to dialysis (Monday, Tuesday, Thursday, Saturday)    [provider]  loratadine (CLARITIN) 10 MG tablet Take 10 mg by mouth daily.     [provider]  Melatonin 5 MG TABS Take 5-10 mg by mouth at bedtime as needed (sleep).     [provider]  metoprolol tartrate (LOPRESSOR) 25 MG tablet Take 0.5 tablets (12.5 mg total) by mouth See admin instructions. Take 1/2 tablet (12.5 mg) by mouth on Sunday, Monday, Wednesday, Friday mornings (non-dialysis days), take 1/2 tablet (12.5 mg) every evening 04/29/19   Deboraha Sprang, MD  midodrine (PROAMATINE) 10 MG tablet Take 10 mg by mouth See admin instructions. Take one tablet (10 mg) by mouth 30 minutes before dialysis on Monday Tuesday, Thursday, Saturday 02/05/19   [provider]  midodrine (PROAMATINE) 2.5 MG tablet Take 1 tablet (2.5 mg total) by mouth 3 (three) times daily with meals. Patient should continue to take 10 mg in the morning prior to hemodialysis sessions. 04/24/19   Scot Jun, FNP  nitroGLYCERIN (NITROSTAT) 0.4 MG SL tablet Place 1 tablet (0.4 mg total) under the tongue every 5 (five) minutes as needed for chest pain. 04/19/18   Rai, Vernelle Emerald, MD  oxyCODONE-acetaminophen (PERCOCET/ROXICET) 5-325 MG tablet Take 1-2 tablets by mouth every 6 (six) hours as needed for severe pain. [id number]   Delora Fuel, MD  OXYGEN Inhale 2-3 L into the lungs continuous. 2-3 lpm 24/7     [provider]  pantoprazole (PROTONIX) 40 MG tablet Take 1 tablet (40 mg total) by  mouth daily. 04/29/19   Deboraha Sprang, MD  polyethylene glycol powder (GLYCOLAX/MIRALAX) 17 GM/SCOOP powder Take 17 g by mouth 2 (two) times daily. 04/19/19   Montine Circle, PA-C  sevelamer carbonate (RENVELA) 800 MG tablet Take 1,600-2,400 mg by mouth See admin instructions. Take 3 tablets (2400 mg) by mouth with meals and 2 tablets (1600 mg) with snacks    [provider]  Skin Protectants, Misc. (EUCERIN) cream Apply 1 application topically at bedtime.    [provider]     Critical care time: 35 min    I personally evaluated patient at bedside, discussed critical care plan with NP Bowser as reflected above and spent over 35 minutes in evaluation, planning and discussing case with patient and daughter.  Eliseo Gum MSN, AGACNP-BC [place] [id number] If no answer, [id number] 08/15/2019, 12:41 AM

## 2019-08-14 NOTE — ED Notes (Signed)
[employer] representative to be call for pacemaker interrogation.

## 2019-08-14 NOTE — ED Provider Notes (Signed)
At signout, f/u on PE study Consult nephrology to determine ICU vs Medicine admission    Ripley Fraise, MD 08/14/19 2357

## 2019-08-14 NOTE — ED Notes (Signed)
Pt is very restless and anxious asking to be sit on the site of the bed, pt oriented that because his BP it will not be a good idea to sit him to the side of the bed, resident consulted and she states that she is ok with the pt sitting on the site of the bed if the daughter is at the bedside. Pt sitting on the site of the bed with daughter on the site of the bed.

## 2019-08-14 NOTE — ED Notes (Signed)
Pt's blood sugar 219 on arrival order for D50 amp gotten from the resident for the hyperkalemia, Dr. Sherry Ruffing consulted and he is ok to give this medication at this time for the hyperkalemia.

## 2019-08-14 NOTE — ED Provider Notes (Signed)
[facility] EMERGENCY DEPARTMENT Provider Note   CSN: [id number] Arrival date & time: 08/14/19  1956     History   Chief Complaint Chief Complaint  Patient presents with   Shortness of Breath   low blood pressure    HPI Jeffrey [name] is a 78 y.o. male with a past medical history of CHF, complete heart block status post pacemaker, ESRD on dialysis, COPD on 2 L [place] at baseline, PVD status post left BKA who presents to the emergency department with shortness of breath and chest pain.  Patient reports he had a normal full session of dialysis yesterday.  Patient reports he was having trouble with his fistula today and had a clot removed from it today.  Patient reports feeling his normal self after the procedure but then started having episodes of shortness of breath associated with chest pain and some altered mental status at home.  Daughter reports patient was more difficult to arouse and appeared to be in respiratory distress.  EMS reports patient had a SPO2 in the 70s to low 80s at home and they placed him on a nonrebreather mask, patient was also found to be hypotensive with EMS.  Patient reports he is not currently having chest pain.  Patient reports he is on Eliquis and most recently took a dose this morning.  Patient denies any fever, cough, congestion, nausea, vomiting.  Patient reports some diarrhea which has been ongoing for multiple weeks.  Patient reports he does not make urine.     The history is provided by the patient and a relative.    Past Medical History:  Diagnosis Date   Anginal pain ([place])    Aortic stenosis    a. severe by echo 09/2014   Atrial fibrillation ([place])    a. not well documented, not on anticoagulation   CHF (congestive heart failure) ([place])    04/28/17 echo-EF 40%, mod LVH, diastolic dysfunction   Claustrophobia    Complete heart block (HCC)    COPD (chronic obstructive pulmonary disease) ([place])    Coronary artery disease    a.  chronically occluded RCA per cath 09/2014 with collaterals B. cath 05/01/17 chr occ RCA w/collaterals, 60-70% mid LAD,    CVA (cerebral vascular accident) ([place]) 10/2014   denies residual on 07/11/2015   ESRD (end stage renal disease) on dialysis [facility])    a. 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   29 mm Edwards Sapien 3 transcatheter heart valve placed via open left transfemoral approach   Type II diabetes mellitus ([place])     Patient Active Problem List   Diagnosis Date Noted   Acute respiratory failure with hypoxia ([place]) 08/15/2019   Osteomyelitis ([place]) 04/10/2019   Acute osteomyelitis of left foot ([place]) 04/10/2019   Cellulitis in diabetic foot ([place]) 02/24/2019   Chronic anticoagulation 02/24/2019   Venous stasis 02/24/2019   Hypotension 02/24/2019   Discoloration of skin of foot 02/20/2019   Discoloration of skin of multiple sites of lower extremity 02/20/2019   RSV (respiratory syncytial virus infection) 11/30/2018   Troponin level  elevated    COPD exacerbation ([place]) 11/29/2018   Chronic respiratory failure with hypoxia (HCC) 10/29/2018   Respiratory distress 07/27/2018   GERD (gastroesophageal reflux disease) 06/23/2018   Cough 06/23/2018   Chest pain 04/15/2018   NSTEMI (non-ST elevated myocardial infarction) ([place]) 04/14/2018   Rash 04/14/2018   COPD GOLD 0 09/26/2017   Atrial fibrillation ([place]) [I48.91] 05/21/2017   Encounter for therapeutic drug monitoring 05/21/2017   Obesity (BMI 30-39.9) 04/17/2017   Hyponatremia 03/10/2017   COPD with acute exacerbation ([place]) 03/10/2017     Non-healing open wound of left groin 09/05/2015   Automatic implantable cardioverter-defibrillator in situ 08/08/2015   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   Hypervolemia    Chronic combined systolic and diastolic CHF (congestive heart failure) ([place]) 07/19/2015   Fluid overload 07/18/2015   Acute on chronic respiratory failure with hypoxia ([place]) 07/18/2015   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/2015   Atherosclerosis of native coronary artery of native heart with angina pectoris ([place])    Protein-calorie malnutrition, severe ([place]) 01/14/2015   ESRD (end stage renal disease) on dialysis ([place]) 01/12/2015   Mobitz type 1 second degree AV block    Elevated troponin 09/30/2014   Diabetes type 2, controlled ([place])    History of stroke 09/29/2014   Type 2 diabetes mellitus with hypoglycemia without coma ([place]) 09/29/2014   CAD (coronary artery disease) 09/29/2014   Essential hypertension    History of tobacco use 11/21/2012   Hyperlipidemia 01/19/2010   Arthritis, degenerative 01/19/2010   Allergic rhinitis 12/05/2009   Idiopathic peripheral neuropathy 01/05/2009   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   Anemia of chronic disease 11/13/2007    Past Surgical History:  Procedure Laterality Date   ABDOMINAL AORTOGRAM W/LOWER EXTREMITY Bilateral 03/27/2019   Procedure: ABDOMINAL AORTOGRAM W/LOWER EXTREMITY;  Surgeon: Angelia Mould, MD;  Location: [facility];  Service: Cardiovascular;  Laterality: Bilateral;   AMPUTATION Left 04/14/2019   Procedure: AMPUTATION BELOW KNEE;  Surgeon: Angelia Mould, MD;  Location: [facility];  Service: Vascular;  Laterality: Left;   AV FISTULA PLACEMENT Left 10/19/2014   Procedure: BRACHIOCEPHALIC ARTERIOVENOUS (AV) FISTULA CREATION ;  Surgeon: Conrad Lake [place], MD;  Location: [facility];  Service: Vascular;  Laterality: Left;   CARDIAC CATHETERIZATION      CARDIAC CATHETERIZATION N/A 07/22/2015   Procedure: Right/Left Heart Cath and Coronary Angiography;  Surgeon: [name], MD;  Location: [facility];  Service: Cardiovascular;  Laterality: N/A;   CATARACT EXTRACTION W/ INTRAOCULAR LENS  IMPLANT, BILATERAL Bilateral 1990's   CHOLECYSTECTOMY OPEN  1980's   COLONOSCOPY W/ BIOPSIES AND POLYPECTOMY     CORONARY ANGIOGRAPHY N/A 07/31/2018   Procedure: CORONARY ANGIOGRAPHY;  Surgeon: Martinique, Peter M, MD;  Location: [facility];  Service: Cardiovascular;  Laterality: N/A;   CORONARY ANGIOPLASTY     CORONARY ATHERECTOMY N/A 04/18/2018   Procedure: CORONARY ATHERECTOMY;  Surgeon: [name], MD;  Location: [facility];  Service: Cardiovascular;  Laterality: N/A;   CORONARY BALLOON ANGIOPLASTY N/A 07/31/2018   Procedure: CORONARY BALLOON ANGIOPLASTY;  Surgeon: Martinique, Peter M, MD;  Location: [facility];  Service: Cardiovascular;  Laterality: N/A;   CORONARY STENT INTERVENTION N/A 04/18/2018   Procedure: CORONARY STENT INTERVENTION;  Surgeon: [name], MD;  Location: [facility];  Service: Cardiovascular;  Laterality: N/A;   EP IMPLANTABLE DEVICE N/A 07/11/2015   Procedure: Pacemaker Implant;  Surgeon: Will Hassell Done  Camnitz, MD;  Location: [facility];  Service: Cardiovascular;  Laterality: N/A;   ESOPHAGOGASTRODUODENOSCOPY  08/01/2012   Procedure: ESOPHAGOGASTRODUODENOSCOPY (EGD);  Surgeon: [name], MD;  Location: [facility];  Service: Endoscopy;  Laterality: N/A;   INSERT / REPLACE / REMOVE PACEMAKER  07/11/2015   INSERTION OF DIALYSIS CATHETER Right 02/02/2015   Procedure: INSERTION OF DIALYSIS CATHETER  RIGHT INTERNAL JUGULAR;  Surgeon: Mal [name], MD;  Location: [facility];  Service: Vascular;  Laterality: Right;   LEFT AND RIGHT HEART CATHETERIZATION WITH CORONARY ANGIOGRAM N/A 09/30/2014   Procedure: LEFT AND RIGHT HEART CATHETERIZATION WITH CORONARY ANGIOGRAM;   Surgeon: [name], MD;  Location[facility] C[facility] LAB;  Service: Cardiovascular;  Laterality: N/A;   LEFT HEART CATH AND CORONARY ANGIOGRAPHY N/A 04/14/2018   Procedure: LEFT HEART CATH AND CORONARY ANGIOGRAPHY;  Surgeon: [name], MD;  Location: [facility];  Service: Cardiovascular;  Laterality: N/A;   TEE WITHOUT CARDIOVERSION N/A 08/02/2015   Procedure: TRANSESOPHAGEAL ECHOCARDIOGRAM (TEE);  Surgeon: [name], MD;  Location: [facility];  Service: Open Heart Surgery;  Laterality: N/A;   TONSILLECTOMY     TRANSCATHETER AORTIC VALVE REPLACEMENT, TRANSFEMORAL Left 08/02/2015   Procedure: TRANSCATHETER AORTIC VALVE REPLACEMENT, TRANSFEMORAL;  Surgeon: [name], MD;  Location: [facility];  Service: Open Heart Surgery;  Laterality: Left;        Home Medications    Prior to Admission medications   Medication Sig Start Date End Date Taking? Authorizing Provider  acetaminophen (TYLENOL) 500 MG tablet Take 1 tablet (500 mg total) by mouth every 6 (six) hours as needed for mild pain or headache (pain). 04/18/19   Pokhrel, Corrie Mckusick, MD  albuterol (PROVENTIL HFA;VENTOLIN HFA) 108 (90 Base) MCG/ACT inhaler Inhale 2 puffs into the lungs every 4 (four) hours as needed for wheezing or shortness of breath. 11/27/18   Tanda Rockers, MD  albuterol (PROVENTIL) (2.5 MG/3ML) 0.083% nebulizer solution Take 3 mLs (2.5 mg total) by nebulization every 6 (six) hours as needed for wheezing or shortness of breath. 01/07/19   Scot Jun, FNP  ALPRAZolam Duanne Moron) 0.25 MG tablet Take 1 tablet (0.25 mg total) by mouth 2 (two) times daily as needed for anxiety. 02/24/19   Norval Morton, MD  apixaban (ELIQUIS) 2.5 MG TABS tablet Take 1 tablet (2.5 mg total) by mouth 2 (two) times daily. 04/29/19   Deboraha Sprang, MD  atorvastatin (LIPITOR) 80 MG tablet Take 1 tablet (80 mg total) by mouth daily at 6 PM. 01/07/19   Scot Jun, FNP  budesonide-formoterol [facility]) 160-4.5 MCG/ACT inhaler Inhale 2 puffs  into the lungs 2 (two) times daily as needed.    [provider]  cephALEXin (KEFLEX) 500 MG capsule Take 1 capsule (500 mg total) by mouth 3 (three) times daily. 04/29/19   Angelia Mould, MD  cinacalcet (SENSIPAR) 30 MG tablet Take 30 mg by mouth See admin instructions. Take one tablet (30 mg) by mouth on Monday, Tuesday, Thursday, Saturday after dialysis    [provider]  clopidogrel (PLAVIX) 75 MG tablet Take 1 tablet (75 mg total) by mouth daily. 04/29/19   Deboraha Sprang, MD  docusate sodium (COLACE) 100 MG capsule Take 1 capsule (100 mg total) by mouth every 12 (twelve) hours. Take 2 tablets per day until you have a bowel movement, then 1 per day until soft. 04/19/19   Montine Circle, PA-C  doxercalciferol (HECTOROL) 4 MCG/2ML injection Inject 0.5 mLs (1 mcg  total) into the vein Every Tuesday,Thursday,and Saturday with dialysis. 02/26/19   Norval Morton, MD  guaiFENesin (MUCINEX) 600 MG 12 hr tablet Take 1 tablet (600 mg total) by mouth 2 (two) times daily. 01/07/19   Scot Jun, FNP  hydrOXYzine (ATARAX/VISTARIL) 25 MG tablet Take 1 tablet (25 mg total) by mouth every 8 (eight) hours as needed for anxiety. 04/22/19   Scot Jun, FNP  insulin detemir (LEVEMIR) 100 UNIT/ML injection Inject 0.2 mLs (20 Units total) into the skin at bedtime. 04/18/19   Pokhrel, Laxman, MD  Insulin Pen Needle 31G X 6 MM MISC at bedtime.  08/28/18   [provider]  LEVEMIR FLEXTOUCH 100 UNIT/ML Pen INJECT 48 UNITS TOTAL INTO THE SKIN AT BEDTIME. 07/17/19   Charlott Rakes, MD  lidocaine-prilocaine (EMLA) cream Apply 1 application topically See admin instructions. Apply topically prior to dialysis (Monday, Tuesday, Thursday, Saturday)    [provider]  loratadine (CLARITIN) 10 MG tablet Take 10 mg by mouth daily.     [provider]  Melatonin 5 MG TABS Take 5-10 mg by mouth at bedtime as needed (sleep).     [provider]  metoprolol  tartrate (LOPRESSOR) 25 MG tablet Take 0.5 tablets (12.5 mg total) by mouth See admin instructions. Take 1/2 tablet (12.5 mg) by mouth on Sunday, Monday, Wednesday, Friday mornings (non-dialysis days), take 1/2 tablet (12.5 mg) every evening 04/29/19   Deboraha Sprang, MD  midodrine (PROAMATINE) 10 MG tablet Take 10 mg by mouth See admin instructions. Take one tablet (10 mg) by mouth 30 minutes before dialysis on Monday Tuesday, Thursday, Saturday 02/05/19   [provider]  midodrine (PROAMATINE) 2.5 MG tablet Take 1 tablet (2.5 mg total) by mouth 3 (three) times daily with meals. Patient should continue to take 10 mg in the morning prior to hemodialysis sessions. 04/24/19   Scot Jun, FNP  nitroGLYCERIN (NITROSTAT) 0.4 MG SL tablet Place 1 tablet (0.4 mg total) under the tongue every 5 (five) minutes as needed for chest pain. 04/19/18   Rai, Vernelle Emerald, MD  oxyCODONE-acetaminophen (PERCOCET/ROXICET) 5-325 MG tablet Take 1-2 tablets by mouth every 6 (six) hours as needed for severe pain. [id number]   Delora Fuel, MD  OXYGEN Inhale 2-3 L into the lungs continuous. 2-3 lpm 24/7     [provider]  pantoprazole (PROTONIX) 40 MG tablet Take 1 tablet (40 mg total) by mouth daily. 04/29/19   Deboraha Sprang, MD  polyethylene glycol powder (GLYCOLAX/MIRALAX) 17 GM/SCOOP powder Take 17 g by mouth 2 (two) times daily. 04/19/19   Montine Circle, PA-C  sevelamer carbonate (RENVELA) 800 MG tablet Take 1,600-2,400 mg by mouth See admin instructions. Take 3 tablets (2400 mg) by mouth with meals and 2 tablets (1600 mg) with snacks    [provider]  Skin Protectants, Misc. (EUCERIN) cream Apply 1 application topically at bedtime.    [provider]    Family History Family History  Problem Relation Age of Onset   Diabetes Father    Heart disease Father    Diabetes Sister    Alzheimer's disease Mother    Stroke Mother    Diabetes Paternal Grandmother    Alzheimer's  disease Maternal Aunt        x 8 Maternal Aunts   Cancer Maternal Uncle        type unknown    Social History Social History   Tobacco Use   Smoking status: Former Smoker  Packs/day: 2.00    Years: 32.00    Pack years: 8.00    Quit date: 11/27/1983    Years since quitting: 35.7   Smokeless tobacco: Never Used  Substance Use Topics   Alcohol use: Yes    Comment: rarely   Drug use: No     Allergies   Byetta 10 mcg pen [exenatide], Codeine, and Coumadin [warfarin sodium]   Review of Systems Review of Systems  Constitutional: Negative for fever.  HENT: Negative for congestion.   Eyes: Negative for visual disturbance.  Respiratory: Positive for chest tightness and shortness of breath. Negative for wheezing.   Cardiovascular: Positive for chest pain and leg swelling.  Gastrointestinal: Positive for diarrhea. Negative for abdominal pain, nausea and vomiting.  Genitourinary:       Does not make urine  Musculoskeletal: Negative for neck stiffness.  Neurological: Negative for headaches.  Psychiatric/Behavioral: Positive for confusion.     Physical Exam Updated Vital Signs BP (!) 82/49    Pulse (!) 54    Temp 98.8 F (37.1 C) (Rectal)    Resp 15    Ht 5\' 11"  (1.803 m)    Wt 95.3 kg    SpO2 100%    BMI 29.30 kg/m   Physical Exam Constitutional:      Appearance: He is ill-appearing. He is not diaphoretic.  HENT:     Head: Normocephalic and atraumatic.     Right Ear: External ear normal.     Left Ear: External ear normal.     Nose: Nose normal.     Mouth/Throat:     Mouth: Mucous membranes are moist.     Pharynx: Oropharynx is clear.  Eyes:     Pupils: Pupils are equal, round, and reactive to light.  Neck:     Musculoskeletal: Neck supple.  Cardiovascular:     Rate and Rhythm: Regular rhythm. Bradycardia present.  Pulmonary:     Effort: Tachypnea, accessory muscle usage and respiratory distress present.     Breath sounds: No stridor. No wheezing or  rhonchi.     Comments: Increased respiratory effort Abdominal:     Palpations: Abdomen is soft.     Tenderness: There is no abdominal tenderness.  Musculoskeletal:     Right lower leg: Edema present.     Comments: L BKA, palpable thrill in left upper extremity AV fistula  Skin:    General: Skin is warm and dry.  Neurological:     General: No focal deficit present.     Mental Status: He is alert and oriented to person, place, and time.     Cranial Nerves: No cranial nerve deficit.  Psychiatric:        Mood and Affect: Mood is anxious.      ED Treatments / Results  Labs (all labs ordered are listed, but only abnormal results are displayed) Labs Reviewed  CBC WITH DIFFERENTIAL/PLATELET - Abnormal; Notable for the following components:      Result Value   RBC 3.85 (*)    Hemoglobin 11.9 (*)    HCT 37.9 (*)    RDW 16.0 (*)    All other components within normal limits  BRAIN NATRIURETIC PEPTIDE - Abnormal; Notable for the following components:   B Natriuretic Peptide 1,710.9 (*)    All other components within normal limits  COMPREHENSIVE METABOLIC PANEL - Abnormal; Notable for the following components:   Sodium 134 (*)    Potassium 6.1 (*)    Chloride 97 (*)  CO2 19 (*)    Glucose, Bld 219 (*)    BUN 35 (*)    Creatinine, Ser 5.19 (*)    Calcium 8.7 (*)    Total Protein 6.4 (*)    Albumin 3.0 (*)    Alkaline Phosphatase 135 (*)    Total Bilirubin 2.0 (*)    GFR calc non Af Amer 10 (*)    GFR calc Af Amer 11 (*)    Anion gap 18 (*)    All other components within normal limits  POCT I-STAT EG7 - Abnormal; Notable for the following components:   pCO2, Ven 36.5 (*)    pO2, Ven 152.0 (*)    Bicarbonate 19.5 (*)    TCO2 21 (*)    Acid-base deficit 6.0 (*)    Sodium 133 (*)    Potassium 5.8 (*)    Calcium, Ion 0.97 (*)    HCT 38.0 (*)    Hemoglobin 12.9 (*)    All other components within normal limits  CBG MONITORING, ED - Abnormal; Notable for the following  components:   Glucose-Capillary 141 (*)    All other components within normal limits  TROPONIN I (HIGH SENSITIVITY) - Abnormal; Notable for the following components:   Troponin I (High Sensitivity) 48 (*)    All other components within normal limits  TROPONIN I (HIGH SENSITIVITY) - Abnormal; Notable for the following components:   Troponin I (High Sensitivity) 49 (*)    All other components within normal limits  SARS CORONAVIRUS 2 (HOSPITAL ORDER, [facility])  CULTURE, BLOOD (ROUTINE X 2)  CULTURE, BLOOD (ROUTINE X 2)  BLOOD GAS, VENOUS  HEMOGLOBIN A1C  COMPREHENSIVE METABOLIC PANEL  PHOSPHORUS  MAGNESIUM  PROCALCITONIN  CORTISOL  CBC  BASIC METABOLIC PANEL    EKG Ventricularly paced rhythm  Radiology Ct Angio Chest Pe W And/or Wo Contrast  Result Date: 08/15/2019 CLINICAL DATA:  Shortness of breath EXAM: CT ANGIOGRAPHY CHEST WITH CONTRAST TECHNIQUE: Multidetector CT imaging of the chest was performed using the standard protocol during bolus administration of intravenous contrast. Multiplanar CT image reconstructions and MIPs were obtained to evaluate the vascular anatomy. CONTRAST:  81mL OMNIPAQUE IOHEXOL 350 MG/ML SOLN COMPARISON:  CT dated 07/21/2015. FINDINGS: Cardiovascular: Evaluation is severely limited by contrast bolus timing. Given this limitation, no large centrally located pulmonary embolus was detected. Detection of smaller segmental and subsegmental pulmonary emboli is significantly limited. The main pulmonary artery is dilated measuring approximately 4.5 cm in diameter.The cardiac size is enlarged. The patient is status post prior TAVR. Coronary artery calcifications and thoracic aortic calcifications are noted. There are multiple chest wall collaterals suggestion stenosis of the right brachiocephalic vein likely secondary to the presence of a right-sided dual chamber pacemaker. There is reflux of contrast into the IVC. Mediastinum/Nodes: --No  mediastinal or hilar lymphadenopathy. --No axillary lymphadenopathy. --No supraclavicular lymphadenopathy. --there is a left-sided thyroid nodule measuring approximately 2.7 cm. --The esophagus is unremarkable Lungs/Pleura: There is atelectasis at the right lung base. There is a trace right-sided pleural effusion. There is no focal infiltrate. Upper Abdomen: There is a 2.3 cm left-sided adrenal nodule which has decreased in size from prior CT in 2016. The patient is status post prior cholecystectomy. Musculoskeletal: No chest wall abnormality. No acute or significant osseous findings. Review of the MIP images confirms the above findings. IMPRESSION: 1. The study is limited by contrast bolus timing. Given this limitation, no large centrally located pulmonary embolus was detected. Detection of smaller  segmental and subsegmental pulmonary emboli is significantly limited. 2. Dilated main pulmonary artery which can be seen in patients with elevated pulmonary artery pressures. 3. Cardiomegaly with reflux of contrast into the IVC, suggesting right heart failure. 4. Trace right pleural effusion. 5. Left-sided thyroid nodule measuring approximately 2.7 cm. Further evaluation with a nonemergent outpatient thyroid ultrasound is recommended. Aortic Atherosclerosis (ICD10-I70.0). Electronically Signed   By: Constance Holster M.D.   On: 08/15/2019 00:47   Dg Chest Portable 1 View  Result Date: 08/14/2019 CLINICAL DATA:  Shortness of breath EXAM: PORTABLE CHEST 1 VIEW COMPARISON:  07/21/2019 FINDINGS: Right-sided pacing device as before. Enlarged cardiomediastinal silhouette with vascular congestion. Aortic atherosclerosis. No pleural effusion or pneumothorax. IMPRESSION: Cardiomegaly with vascular congestion. Electronically Signed   By: Donavan Foil M.D.   On: 08/14/2019 20:29    Procedures Procedures (including critical care time)  Medications Ordered in ED Medications  sodium zirconium cyclosilicate (LOKELMA)  packet 10 g (10 g Oral Given 08/14/19 2147)  dextrose 50 % solution (  Canceled Entry 08/14/19 2308)  insulin aspart (novoLOG) injection 0-15 Units (2 Units Subcutaneous Given 08/15/19 0059)  midodrine (PROAMATINE) tablet 10 mg (has no administration in time range)  albuterol (VENTOLIN HFA) 108 (90 Base) MCG/ACT inhaler 2 puff (has no administration in time range)  fluticasone furoate-vilanterol (BREO ELLIPTA) 200-25 MCG/INH 1 puff (has no administration in time range)  hydrOXYzine (ATARAX/VISTARIL) tablet 25 mg (has no administration in time range)  apixaban (ELIQUIS) tablet 2.5 mg (has no administration in time range)  clopidogrel (PLAVIX) tablet 75 mg (has no administration in time range)  insulin aspart (novoLOG) injection 5 Units (5 Units Intravenous Given 08/14/19 2158)    And  dextrose 50 % solution 50 mL (50 mLs Intravenous Given 08/14/19 2208)  albuterol (VENTOLIN HFA) 108 (90 Base) MCG/ACT inhaler 4 puff (4 puffs Inhalation Given 08/14/19 2148)  calcium gluconate 1 g/ 50 mL sodium chloride IVPB ( Intravenous Stopped 08/14/19 2302)  sodium bicarbonate injection 50 mEq (50 mEq Intravenous Given 08/14/19 2307)  iohexol (OMNIPAQUE) 350 MG/ML injection 75 mL (75 mLs Intravenous Contrast Given 08/14/19 2356)     Initial Impression / Assessment and Plan / ED Course  I have reviewed the triage vital signs and the nursing notes.  Pertinent labs & imaging results that were available during my care of the patient were reviewed by me and considered in my medical decision making (see chart for details).       Given report of recent clot removed from patient's AV fistula and sudden onset hypoxia concern for possible pulmonary embolism.  Patient's SPO2 in the mid to high 90s on 15 L nonrebreather mask.  Patient also appears volume up on exam and based on patient's chest x-ray.  Patient's pacemaker was interrogated.  Patient was found to be hyperkalemic and given albuterol puffs, insulin and dextrose,  calcium gluconate, and lokelma.  Nephrology was consulted who reports patient will need dialysis versus CRRT tonight depending on CT PE findings and blood pressures.  Patient had been given 300 cc of IV fluid with EMS, will hold off on further IV fluids as patient appears volume overloaded.  Critical care was consulted for admission and evaluated patient in the emergency department and recommended patient's level of care will depend on whether or not he requires CRRT.  CT PE study currently in process.  Patient care transferred to Dr. Christy Gentles at approximately 12 AM, please see his note for ultimate disposition.  Plan to follow-up CT PE  study and reengage nephrology for dialysis versus CRRT recommendations which will determine ICU versus stepdown unit level of care.  Patient was seen and plan discussed with Dr. Sherry Ruffing.  Final Clinical Impressions(s) / ED Diagnoses   Final diagnoses:  SOB (shortness of breath)  Hyperkalemia  ESRD (end stage renal disease) [facility])  Hypoxia    ED Discharge Orders    None       Betsey Amen, MD 08/15/19 0127    Tegeler, Gwenyth Allegra, MD 08/15/19 [phone]

## 2019-08-15 ENCOUNTER — Inpatient Hospital Stay (HOSPITAL_COMMUNITY): Payer: Medicare Other

## 2019-08-15 DIAGNOSIS — I5021 Acute systolic (congestive) heart failure: Secondary | ICD-10-CM | POA: Diagnosis not present

## 2019-08-15 DIAGNOSIS — Z89512 Acquired absence of left leg below knee: Secondary | ICD-10-CM | POA: Diagnosis not present

## 2019-08-15 DIAGNOSIS — E1151 Type 2 diabetes mellitus with diabetic peripheral angiopathy without gangrene: Secondary | ICD-10-CM | POA: Diagnosis present

## 2019-08-15 DIAGNOSIS — J9621 Acute and chronic respiratory failure with hypoxia: Secondary | ICD-10-CM | POA: Diagnosis present

## 2019-08-15 DIAGNOSIS — E118 Type 2 diabetes mellitus with unspecified complications: Secondary | ICD-10-CM | POA: Diagnosis not present

## 2019-08-15 DIAGNOSIS — I442 Atrioventricular block, complete: Secondary | ICD-10-CM | POA: Diagnosis not present

## 2019-08-15 DIAGNOSIS — I361 Nonrheumatic tricuspid (valve) insufficiency: Secondary | ICD-10-CM | POA: Diagnosis not present

## 2019-08-15 DIAGNOSIS — J449 Chronic obstructive pulmonary disease, unspecified: Secondary | ICD-10-CM | POA: Diagnosis present

## 2019-08-15 DIAGNOSIS — I5023 Acute on chronic systolic (congestive) heart failure: Secondary | ICD-10-CM | POA: Diagnosis not present

## 2019-08-15 DIAGNOSIS — I340 Nonrheumatic mitral (valve) insufficiency: Secondary | ICD-10-CM | POA: Diagnosis not present

## 2019-08-15 DIAGNOSIS — J9601 Acute respiratory failure with hypoxia: Secondary | ICD-10-CM | POA: Diagnosis not present

## 2019-08-15 DIAGNOSIS — I9589 Other hypotension: Secondary | ICD-10-CM | POA: Diagnosis not present

## 2019-08-15 DIAGNOSIS — I5042 Chronic combined systolic (congestive) and diastolic (congestive) heart failure: Secondary | ICD-10-CM | POA: Diagnosis not present

## 2019-08-15 DIAGNOSIS — N2581 Secondary hyperparathyroidism of renal origin: Secondary | ICD-10-CM | POA: Diagnosis not present

## 2019-08-15 DIAGNOSIS — D631 Anemia in chronic kidney disease: Secondary | ICD-10-CM | POA: Diagnosis present

## 2019-08-15 DIAGNOSIS — Z515 Encounter for palliative care: Secondary | ICD-10-CM | POA: Diagnosis not present

## 2019-08-15 DIAGNOSIS — Z20828 Contact with and (suspected) exposure to other viral communicable diseases: Secondary | ICD-10-CM | POA: Diagnosis present

## 2019-08-15 DIAGNOSIS — E1122 Type 2 diabetes mellitus with diabetic chronic kidney disease: Secondary | ICD-10-CM | POA: Diagnosis not present

## 2019-08-15 DIAGNOSIS — Z9981 Dependence on supplemental oxygen: Secondary | ICD-10-CM | POA: Diagnosis not present

## 2019-08-15 DIAGNOSIS — W19XXXA Unspecified fall, initial encounter: Secondary | ICD-10-CM | POA: Diagnosis present

## 2019-08-15 DIAGNOSIS — E041 Nontoxic single thyroid nodule: Secondary | ICD-10-CM | POA: Diagnosis present

## 2019-08-15 DIAGNOSIS — E875 Hyperkalemia: Secondary | ICD-10-CM | POA: Diagnosis not present

## 2019-08-15 DIAGNOSIS — I4821 Permanent atrial fibrillation: Secondary | ICD-10-CM | POA: Diagnosis not present

## 2019-08-15 DIAGNOSIS — E785 Hyperlipidemia, unspecified: Secondary | ICD-10-CM | POA: Diagnosis present

## 2019-08-15 DIAGNOSIS — I2510 Atherosclerotic heart disease of native coronary artery without angina pectoris: Secondary | ICD-10-CM | POA: Diagnosis present

## 2019-08-15 DIAGNOSIS — R0602 Shortness of breath: Secondary | ICD-10-CM | POA: Diagnosis not present

## 2019-08-15 DIAGNOSIS — I132 Hypertensive heart and chronic kidney disease with heart failure and with stage 5 chronic kidney disease, or end stage renal disease: Secondary | ICD-10-CM | POA: Diagnosis present

## 2019-08-15 DIAGNOSIS — Z953 Presence of xenogenic heart valve: Secondary | ICD-10-CM | POA: Diagnosis not present

## 2019-08-15 DIAGNOSIS — I5043 Acute on chronic combined systolic (congestive) and diastolic (congestive) heart failure: Secondary | ICD-10-CM | POA: Diagnosis present

## 2019-08-15 DIAGNOSIS — R55 Syncope and collapse: Secondary | ICD-10-CM | POA: Diagnosis not present

## 2019-08-15 DIAGNOSIS — I120 Hypertensive chronic kidney disease with stage 5 chronic kidney disease or end stage renal disease: Secondary | ICD-10-CM | POA: Diagnosis not present

## 2019-08-15 DIAGNOSIS — Y92019 Unspecified place in single-family (private) house as the place of occurrence of the external cause: Secondary | ICD-10-CM | POA: Diagnosis not present

## 2019-08-15 DIAGNOSIS — N186 End stage renal disease: Secondary | ICD-10-CM | POA: Diagnosis not present

## 2019-08-15 DIAGNOSIS — Z7189 Other specified counseling: Secondary | ICD-10-CM | POA: Diagnosis not present

## 2019-08-15 DIAGNOSIS — E1165 Type 2 diabetes mellitus with hyperglycemia: Secondary | ICD-10-CM | POA: Diagnosis present

## 2019-08-15 DIAGNOSIS — I480 Paroxysmal atrial fibrillation: Secondary | ICD-10-CM | POA: Diagnosis not present

## 2019-08-15 DIAGNOSIS — I472 Ventricular tachycardia: Secondary | ICD-10-CM | POA: Diagnosis present

## 2019-08-15 DIAGNOSIS — I878 Other specified disorders of veins: Secondary | ICD-10-CM | POA: Diagnosis present

## 2019-08-15 DIAGNOSIS — Z992 Dependence on renal dialysis: Secondary | ICD-10-CM | POA: Diagnosis not present

## 2019-08-15 DIAGNOSIS — I951 Orthostatic hypotension: Secondary | ICD-10-CM | POA: Diagnosis present

## 2019-08-15 DIAGNOSIS — Z23 Encounter for immunization: Secondary | ICD-10-CM | POA: Diagnosis not present

## 2019-08-15 LAB — BASIC METABOLIC PANEL
Anion gap: 16 — ABNORMAL HIGH (ref 5–15)
BUN: 39 mg/dL — ABNORMAL HIGH (ref 8–23)
CO2: 24 mmol/L (ref 22–32)
Calcium: 9.2 mg/dL (ref 8.9–10.3)
Chloride: 96 mmol/L — ABNORMAL LOW (ref 98–111)
Creatinine, Ser: 5.34 mg/dL — ABNORMAL HIGH (ref 0.61–1.24)
GFR calc Af Amer: 11 mL/min — ABNORMAL LOW (ref >60)
GFR calc non Af Amer: 9 mL/min — ABNORMAL LOW (ref >60)
Glucose, Bld: 145 mg/dL — ABNORMAL HIGH (ref 70–99)
Potassium: 6.2 mmol/L — ABNORMAL HIGH (ref 3.5–5.1)
Sodium: 136 mmol/L (ref 135–145)

## 2019-08-15 LAB — COMPREHENSIVE METABOLIC PANEL
ALT: 22 U/L (ref 0–44)
AST: 36 U/L (ref 15–41)
Albumin: 3.2 g/dL — ABNORMAL LOW (ref 3.5–5.0)
Alkaline Phosphatase: 138 U/L — ABNORMAL HIGH (ref 38–126)
Anion gap: 17 — ABNORMAL HIGH (ref 5–15)
BUN: 38 mg/dL — ABNORMAL HIGH (ref 8–23)
CO2: 23 mmol/L (ref 22–32)
Calcium: 9 mg/dL (ref 8.9–10.3)
Chloride: 95 mmol/L — ABNORMAL LOW (ref 98–111)
Creatinine, Ser: 5.27 mg/dL — ABNORMAL HIGH (ref 0.61–1.24)
GFR calc Af Amer: 11 mL/min — ABNORMAL LOW (ref >60)
GFR calc non Af Amer: 9 mL/min — ABNORMAL LOW (ref >60)
Glucose, Bld: 146 mg/dL — ABNORMAL HIGH (ref 70–99)
Potassium: 5.8 mmol/L — ABNORMAL HIGH (ref 3.5–5.1)
Sodium: 135 mmol/L (ref 135–145)
Total Bilirubin: 2.1 mg/dL — ABNORMAL HIGH (ref 0.3–1.2)
Total Protein: 6.4 g/dL — ABNORMAL LOW (ref 6.5–8.1)

## 2019-08-15 LAB — GLUCOSE, CAPILLARY
Glucose-Capillary: 153 mg/dL — ABNORMAL HIGH (ref 70–99)
Glucose-Capillary: 109 mg/dL — ABNORMAL HIGH (ref 70–99)
Glucose-Capillary: 87 mg/dL (ref 70–99)
Glucose-Capillary: 150 mg/dL — ABNORMAL HIGH (ref 70–99)
Glucose-Capillary: 138 mg/dL — ABNORMAL HIGH (ref 70–99)

## 2019-08-15 LAB — ECHOCARDIOGRAM COMPLETE
Height: 71 in
Weight: 3358.05 oz

## 2019-08-15 LAB — CORTISOL: Cortisol, Plasma: 23.1 ug/dL

## 2019-08-15 LAB — PHOSPHORUS: Phosphorus: 6.8 mg/dL — ABNORMAL HIGH (ref 2.5–4.6)

## 2019-08-15 LAB — CBC
HCT: 35.1 % — ABNORMAL LOW (ref 39.0–52.0)
Hemoglobin: 11.4 g/dL — ABNORMAL LOW (ref 13.0–17.0)
MCH: 30.9 pg (ref 26.0–34.0)
MCHC: 32.5 g/dL (ref 30.0–36.0)
MCV: 95.1 fL (ref 80.0–100.0)
Platelets: 185 10*3/uL (ref 150–400)
RBC: 3.69 MIL/uL — ABNORMAL LOW (ref 4.22–5.81)
RDW: 16.2 % — ABNORMAL HIGH (ref 11.5–15.5)
WBC: 7.1 10*3/uL (ref 4.0–10.5)
nRBC: 0 % (ref 0.0–0.2)

## 2019-08-15 LAB — TROPONIN I (HIGH SENSITIVITY): Troponin I (High Sensitivity): 49 ng/L — ABNORMAL HIGH (ref <18)

## 2019-08-15 LAB — CBG MONITORING, ED: Glucose-Capillary: 141 mg/dL — ABNORMAL HIGH (ref 70–99)

## 2019-08-15 LAB — HEMOGLOBIN A1C
Hgb A1c MFr Bld: 9.3 % — ABNORMAL HIGH (ref 4.8–5.6)
Mean Plasma Glucose: 220.21 mg/dL

## 2019-08-15 LAB — MRSA PCR SCREENING: MRSA by PCR: NEGATIVE

## 2019-08-15 LAB — MAGNESIUM: Magnesium: 1.8 mg/dL (ref 1.7–2.4)

## 2019-08-15 LAB — PROCALCITONIN: Procalcitonin: 0.3 ng/mL

## 2019-08-15 MED ORDER — SODIUM CHLORIDE 0.9 % IV SOLN: 100.0000 mL | INTRAVENOUS | Status: DC | PRN

## 2019-08-15 MED ORDER — LIDOCAINE-PRILOCAINE 2.5-2.5 % EX CREA: 1.0000 "application " | TOPICAL_CREAM | CUTANEOUS | Status: DC | PRN

## 2019-08-15 MED ORDER — PENTAFLUOROPROP-TETRAFLUOROETH EX AERO: 1.0000 "application " | INHALATION_SPRAY | CUTANEOUS | Status: DC | PRN

## 2019-08-15 MED ORDER — CLOPIDOGREL BISULFATE 75 MG PO TABS
75.0000 mg | ORAL_TABLET | Freq: Every day | ORAL | Status: DC
  Administered 2019-08-15 – 2019-08-20 (×6): 75 mg via ORAL
  Filled 2019-08-15 (×6): qty 1

## 2019-08-15 MED ORDER — HEPARIN SODIUM (PORCINE) 1000 UNIT/ML DIALYSIS: 1000.0000 [IU] | INTRAMUSCULAR | Status: DC | PRN

## 2019-08-15 MED ORDER — APIXABAN 2.5 MG PO TABS
2.5000 mg | ORAL_TABLET | Freq: Two times a day (BID) | ORAL | Status: DC
  Administered 2019-08-15 – 2019-08-20 (×11): 2.5 mg via ORAL
  Filled 2019-08-15 (×12): qty 1

## 2019-08-15 MED ORDER — LIDOCAINE HCL (PF) 1 % IJ SOLN: 5.0000 mL | INTRAMUSCULAR | Status: DC | PRN

## 2019-08-15 MED ORDER — VANCOMYCIN 50 MG/ML ORAL SOLUTION
125.0000 mg | Freq: Four times a day (QID) | ORAL | Status: DC
  Administered 2019-08-15 – 2019-08-16 (×4): 125 mg via ORAL
  Filled 2019-08-15 (×5): qty 2.5

## 2019-08-15 MED ORDER — INSULIN ASPART 100 UNIT/ML ~~LOC~~ SOLN
0.0000 [IU] | Freq: Three times a day (TID) | SUBCUTANEOUS | Status: DC
  Administered 2019-08-15: 17:00:00 1 [IU] via SUBCUTANEOUS
  Administered 2019-08-16: 3 [IU] via SUBCUTANEOUS
  Administered 2019-08-16 (×2): 1 [IU] via SUBCUTANEOUS
  Administered 2019-08-17: 2 [IU] via SUBCUTANEOUS
  Administered 2019-08-18: 3 [IU] via SUBCUTANEOUS
  Administered 2019-08-18: 1 [IU] via SUBCUTANEOUS
  Administered 2019-08-19: 3 [IU] via SUBCUTANEOUS
  Administered 2019-08-19 – 2019-08-20 (×3): 2 [IU] via SUBCUTANEOUS

## 2019-08-15 MED ORDER — APIXABAN 2.5 MG PO TABS: 2.5000 mg | ORAL_TABLET | Freq: Two times a day (BID) | ORAL | Status: DC

## 2019-08-15 MED ORDER — ALBUMIN HUMAN 25 % IV SOLN
25.0000 g | Freq: Once | INTRAVENOUS | Status: AC
  Administered 2019-08-15: 08:00:00 25 g via INTRAVENOUS

## 2019-08-15 MED ORDER — ALTEPLASE 2 MG IJ SOLR
2.0000 mg | Freq: Once | INTRAMUSCULAR | Status: DC | PRN
  Filled 2019-08-15: qty 2

## 2019-08-15 MED ORDER — ORAL CARE MOUTH RINSE
15.0000 mL | Freq: Two times a day (BID) | OROMUCOSAL | Status: DC
  Administered 2019-08-15 – 2019-08-20 (×11): 15 mL via OROMUCOSAL

## 2019-08-15 MED ORDER — VANCOMYCIN HCL 125 MG PO CAPS: 125.0000 mg | ORAL_CAPSULE | Freq: Four times a day (QID) | ORAL | Status: DC

## 2019-08-15 MED ORDER — MORPHINE SULFATE (PF) 2 MG/ML IV SOLN
2.0000 mg | Freq: Once | INTRAVENOUS | Status: AC
  Administered 2019-08-15: 03:00:00 2 mg via INTRAVENOUS
  Filled 2019-08-15: qty 1

## 2019-08-15 MED ORDER — FLUTICASONE FUROATE-VILANTEROL 200-25 MCG/INH IN AEPB
1.0000 | INHALATION_SPRAY | Freq: Every day | RESPIRATORY_TRACT | Status: DC
  Administered 2019-08-16 – 2019-08-20 (×4): 1 via RESPIRATORY_TRACT
  Filled 2019-08-15: qty 28

## 2019-08-15 MED ORDER — HYDROXYZINE HCL 25 MG PO TABS
25.0000 mg | ORAL_TABLET | Freq: Three times a day (TID) | ORAL | Status: DC | PRN
  Administered 2019-08-15 – 2019-08-17 (×2): 25 mg via ORAL
  Filled 2019-08-15 (×2): qty 1

## 2019-08-15 MED ORDER — MIDODRINE HCL 5 MG PO TABS
10.0000 mg | ORAL_TABLET | Freq: Three times a day (TID) | ORAL | Status: DC
  Administered 2019-08-15 – 2019-08-16 (×6): 10 mg via ORAL
  Filled 2019-08-15 (×6): qty 2

## 2019-08-15 MED ORDER — ALBUTEROL SULFATE (2.5 MG/3ML) 0.083% IN NEBU: 3.0000 mL | INHALATION_SOLUTION | RESPIRATORY_TRACT | Status: DC | PRN

## 2019-08-15 MED ORDER — INSULIN ASPART 100 UNIT/ML ~~LOC~~ SOLN
0.0000 [IU] | Freq: Every day | SUBCUTANEOUS | Status: DC
  Administered 2019-08-16 – 2019-08-19 (×3): 2 [IU] via SUBCUTANEOUS

## 2019-08-15 MED ORDER — ENOXAPARIN SODIUM 30 MG/0.3ML ~~LOC~~ SOLN: 30.0000 mg | SUBCUTANEOUS | Status: DC

## 2019-08-15 MED ORDER — CHLORHEXIDINE GLUCONATE CLOTH 2 % EX PADS
6.0000 | MEDICATED_PAD | Freq: Every day | CUTANEOUS | Status: DC
  Administered 2019-08-15: 04:00:00 6 via TOPICAL

## 2019-08-15 MED ORDER — CLOPIDOGREL BISULFATE 75 MG PO TABS: 75.0000 mg | ORAL_TABLET | Freq: Every day | ORAL | Status: DC

## 2019-08-15 MED ORDER — ALBUMIN HUMAN 25 % IV SOLN
INTRAVENOUS | Status: AC
  Administered 2019-08-15: 25 g via INTRAVENOUS
  Filled 2019-08-15: qty 100

## 2019-08-15 NOTE — ED Notes (Signed)
ED TO INPATIENT HANDOFF REPORT  ED Nurse Name and Phone #:   S Name/Age/Gender Jeffrey [name] 78 y.o. male Room/Bed: 016C/016C  Code Status   Code Status: Full Code  Home/SNF/Other Dc home AO x 4   Triage Complete: Triage complete  Chief Complaint Low sats; Hypotensive; Decreased LOC  Triage Note Pt brought to ED by [facility] for c/o hypotension, SOB and low O2 saturation, HD pt, last treatment yesterday with no problem, pt got a procedure on his fistula to remove a blood clot. Pt was found lethargic by daughter around 18:30, pt is O2 home dependent at home on 2 L found by EMS to be in the low 70 -80%, placed on NRM up to 100%, initial BP 80/70, 330 mL NS bolus given and BP up to 98/64 V paced on the monitor on the 50's cbg 275   Allergies Allergies  Allergen Reactions  . Byetta 10 Mcg Pen [Exenatide] Diarrhea and Nausea And Vomiting  . Codeine Itching  . Coumadin [Warfarin Sodium] Rash    (wife states coumadin was stopped but rash did not disappear 02/21/19)    Level of Care/Admitting Diagnosis ED Disposition    ED Disposition Condition [facility] Area: [place] [[id number]]  Level of Care: ICU [6]  Covid Evaluation: Confirmed COVID Negative  Diagnosis: Acute respiratory failure with hypoxia [facility]) [id number]  Admitting Physician: Shellia Cleverly [id number]  Attending Physician: Shellia Cleverly [phone]  Estimated length of stay: past midnight tomorrow  Certification:: I certify this patient will need inpatient services for at least 2 midnights  PT Class (Do Not Modify): Inpatient [101]  PT Acc Code (Do Not Modify): Private [1]       B Medical/Surgery History Past Medical History:  Diagnosis Date  . Anginal pain ([place])   . Aortic stenosis    a. severe by echo 09/2014  . Atrial fibrillation ([place])    a. not well documented, not on anticoagulation  . CHF (congestive heart failure) ([place])    04/28/17 echo-EF 40%, mod LVH, diastolic  dysfunction  . Claustrophobia   . Complete heart block ([place])   . COPD (chronic obstructive pulmonary disease) ([place])   . Coronary artery disease    a. chronically occluded RCA per cath 09/2014 with collaterals B. cath 05/01/17 chr occ RCA w/collaterals, 60-70% mid LAD,   . CVA (cerebral vascular accident) ([place]) 10/2014   denies residual on 07/11/2015  . ESRD (end stage renal disease) on dialysis [facility])    a. on dialysis; [place]; MWF, LUE fistula (07/11/2015)  . History of blood transfusion    "related to gallbladder OR"  . History of stomach ulcers   . Hyperlipidemia   . Hypertension   . Iron deficiency anemia   . Myocardial infarction ([place]) 10/2014  . Peripheral vascular disease ([place])   . Pneumonia   . Presence of permanent cardiac pacemaker   . 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 Type II diabetes mellitus ([place])    Past Surgical History:  Procedure Laterality Date  . ABDOMINAL AORTOGRAM W/LOWER EXTREMITY Bilateral 03/27/2019   Procedure: ABDOMINAL AORTOGRAM W/LOWER EXTREMITY;  Surgeon: Angelia Mould, MD;  Location: [place] [facility];  Service: Cardiovascular;  Laterality: Bilateral;  . AMPUTATION Left 04/14/2019   Procedure: AMPUTATION BELOW KNEE;  Surgeon: Angelia Mould, MD;  Location: [place];  Service: Vascular;  Laterality: Left;  . AV FISTULA PLACEMENT Left 10/19/2014   Procedure: BRACHIOCEPHALIC ARTERIOVENOUS (AV) FISTULA CREATION ;  Surgeon: Conrad [place], MD;  Location: [place];  Service: Vascular;  Laterality: Left;  . CARDIAC CATHETERIZATION    . CARDIAC CATHETERIZATION N/A 07/22/2015   Procedure: Right/Left Heart Cath and Coronary Angiography;  Surgeon: [name], MD;  Location: [place] [facility];  Service: Cardiovascular;  Laterality: N/A;  . CATARACT EXTRACTION W/ INTRAOCULAR LENS  IMPLANT, BILATERAL Bilateral 1990's  .  CHOLECYSTECTOMY OPEN  1980's  . COLONOSCOPY W/ BIOPSIES AND POLYPECTOMY    . CORONARY ANGIOGRAPHY N/A 07/31/2018   Procedure: CORONARY ANGIOGRAPHY;  Surgeon: Martinique, Peter M, MD;  Location: [place] [facility];  Service: Cardiovascular;  Laterality: N/A;  . CORONARY ANGIOPLASTY    . CORONARY ATHERECTOMY N/A 04/18/2018   Procedure: CORONARY ATHERECTOMY;  Surgeon: [name], MD;  Location: [place] [facility];  Service: Cardiovascular;  Laterality: N/A;  . CORONARY BALLOON ANGIOPLASTY N/A 07/31/2018   Procedure: CORONARY BALLOON ANGIOPLASTY;  Surgeon: Martinique, Peter M, MD;  Location: [place] [facility];  Service: Cardiovascular;  Laterality: N/A;  . CORONARY STENT INTERVENTION N/A 04/18/2018   Procedure: CORONARY STENT INTERVENTION;  Surgeon: [name], MD;  Location: [place] [facility];  Service: Cardiovascular;  Laterality: N/A;  . EP IMPLANTABLE DEVICE N/A 07/11/2015   Procedure: Pacemaker Implant;  Surgeon: [name], MD;  Location: [place] [facility];  Service: Cardiovascular;  Laterality: N/A;  . ESOPHAGOGASTRODUODENOSCOPY  08/01/2012   Procedure: ESOPHAGOGASTRODUODENOSCOPY (EGD);  Surgeon: [name], MD;  Location: [facility];  Service: Endoscopy;  Laterality: N/A;  . INSERT / REPLACE / REMOVE PACEMAKER  07/11/2015  . INSERTION OF DIALYSIS CATHETER Right 02/02/2015   Procedure: INSERTION OF DIALYSIS CATHETER  RIGHT INTERNAL JUGULAR;  Surgeon: Mal [name], MD;  Location: [place];  Service: Vascular;  Laterality: Right;  . LEFT AND RIGHT HEART CATHETERIZATION WITH CORONARY ANGIOGRAM N/A 09/30/2014   Procedure: LEFT AND RIGHT HEART CATHETERIZATION WITH CORONARY ANGIOGRAM;  Surgeon: [name], MD;  Location: [facility] C[facility];  Service: Cardiovascular;  Laterality: N/A;  . LEFT HEART CATH AND CORONARY ANGIOGRAPHY N/A 04/14/2018   Procedure: LEFT HEART CATH AND CORONARY ANGIOGRAPHY;  Surgeon: [name], MD;  Location: [place] [facility];  Service: Cardiovascular;   Laterality: N/A;  . TEE WITHOUT CARDIOVERSION N/A 08/02/2015   Procedure: TRANSESOPHAGEAL ECHOCARDIOGRAM (TEE);  Surgeon: [name], MD;  Location: [place];  Service: Open Heart Surgery;  Laterality: N/A;  . TONSILLECTOMY    . TRANSCATHETER AORTIC VALVE REPLACEMENT, TRANSFEMORAL Left 08/02/2015   Procedure: TRANSCATHETER AORTIC VALVE REPLACEMENT, TRANSFEMORAL;  Surgeon: [name], MD;  Location: [place];  Service: Open Heart Surgery;  Laterality: Left;     A IV Location/Drains/Wounds Patient Lines/Drains/Airways Status   Active Line/Drains/Airways    Name:   Placement date:   Placement time:   Site:   Days:   Peripheral IV 08/14/19 Right Antecubital   08/14/19    2159    Antecubital   1   Fistula / Graft Left Upper arm Arteriovenous fistula   -    -    Upper arm      Incision (Closed) 04/14/19 Leg Left   04/14/19    0839     123          Intake/Output Last 24 hours  Intake/Output Summary (Last 24 hours) at 08/15/2019 0205 Last data filed  at 08/14/2019 2307 Gross per 24 hour  Intake 50 ml  Output -  Net 50 ml    Labs/Imaging Results for orders placed or performed during the hospital encounter of 08/14/19 (from the past 48 hour(s))  CBC with Differential     Status: Abnormal   Collection Time: 08/14/19  8:16 PM  Result Value Ref Range   WBC 6.9 4.0 - 10.5 K/uL   RBC 3.85 (L) 4.22 - 5.81 MIL/uL   Hemoglobin 11.9 (L) 13.0 - 17.0 g/dL   HCT 37.9 (L) 39.0 - 52.0 %   MCV 98.4 80.0 - 100.0 fL   MCH 30.9 26.0 - 34.0 pg   MCHC 31.4 30.0 - 36.0 g/dL   RDW 16.0 (H) 11.5 - 15.5 %   Platelets 172 150 - 400 K/uL   nRBC 0.0 0.0 - 0.2 %   Neutrophils Relative % 69 %   Neutro Abs 4.7 1.7 - 7.7 K/uL   Lymphocytes Relative 14 %   Lymphs Abs 1.0 0.7 - 4.0 K/uL   Monocytes Relative 12 %   Monocytes Absolute 0.9 0.1 - 1.0 K/uL   Eosinophils Relative 4 %   Eosinophils Absolute 0.3 0.0 - 0.5 K/uL   Basophils Relative 1 %   Basophils Absolute 0.0 0.0 - 0.1 K/uL   Immature Granulocytes 0  %   Abs Immature Granulocytes 0.03 0.00 - 0.07 K/uL    Comment: Performed at [facility], [address] [place], [place] [address]  Brain natriuretic peptide     Status: Abnormal   Collection Time: 08/14/19  8:16 PM  Result Value Ref [place]   B Natriuretic Peptide 1,710.9 (H) 0.0 - 100.0 pg/mL    Comment: Performed at [facility] [address], [place], [place] [address]  Comprehensive metabolic panel     Status: Abnormal   Collection Time: 08/14/19  8:16 PM  Result Value Ref Range   Sodium 134 (L) 135 - 145 mmol/L   Potassium 6.1 (H) 3.5 - 5.1 mmol/L   Chloride 97 (L) 98 - 111 mmol/L   CO2 19 (L) 22 - 32 mmol/L   Glucose, Bld 219 (H) 70 - 99 mg/dL   BUN 35 (H) 8 - 23 mg/dL   Creatinine, Ser 5.19 (H) 0.61 - 1.24 mg/dL   Calcium 8.7 (L) 8.9 - 10.3 mg/dL   Total Protein 6.4 (L) 6.5 - 8.1 g/dL   Albumin 3.0 (L) 3.5 - 5.0 g/dL   AST 33 15 - 41 U/L   ALT 20 0 - 44 U/L   Alkaline Phosphatase 135 (H) 38 - 126 U/L   Total Bilirubin 2.0 (H) 0.3 - 1.2 mg/dL   GFR calc non Af Amer 10 (L) >60 mL/min   GFR calc Af Amer 11 (L) >60 mL/min   Anion gap 18 (H) 5 - 15    Comment: Performed at [facility], [place] [address] [place], [place] [address]  Troponin I (High Sensitivity)     Status: Abnormal   Collection Time: 08/14/19  8:16 PM  Result Value Ref Range   Troponin I (High Sensitivity) 48 (H) <18 ng/L    Comment: (NOTE) Elevated high sensitivity troponin I (hsTnI) values and significant  changes across serial measurements may suggest ACS but many other  chronic and acute conditions are known to elevate hsTnI results.  Refer to the "Links" section for chest pain algorithms and additional  guidance. Performed at [facility], [place] [address]  [place], [place] [address]   SARS Coronavirus 2 [facility] order, Performed in [facility] hospital lab) Nasopharyngeal Nasopharyngeal Swab     Status: None   Collection Time: 08/14/19  8:16 PM   Specimen: Nasopharyngeal  Swab  Result Value Ref Range   SARS Coronavirus 2 NEGATIVE NEGATIVE    Comment: (NOTE) If result is NEGATIVE SARS-CoV-2 target nucleic acids are NOT DETECTED. The SARS-CoV-2 RNA is generally detectable in upper and lower  respiratory specimens during the acute phase of infection. The lowest  concentration of SARS-CoV-2 viral copies this assay can detect is 250  copies / mL. A negative result does not preclude SARS-CoV-2 infection  and should not be used as the sole basis for treatment or other  patient management decisions.  A negative result may occur with  improper specimen collection / handling, submission of specimen other  than nasopharyngeal swab, presence of viral mutation(s) within the  areas targeted by this assay, and inadequate number of viral copies  (<250 copies / mL). A negative result must be combined with clinical  observations, patient history, and epidemiological information. If result is POSITIVE SARS-CoV-2 target nucleic acids are DETECTED. The SARS-CoV-2 RNA is generally detectable in upper and lower  respiratory specimens dur ing the acute phase of infection.  Positive  results are indicative of active infection with SARS-CoV-2.  Clinical  correlation with patient history and other diagnostic information is  necessary to determine patient infection status.  Positive results do  not rule out bacterial infection or co-infection with other viruses. If result is PRESUMPTIVE POSTIVE SARS-CoV-2 nucleic acids MAY BE PRESENT.   A presumptive positive result was obtained on the submitted specimen  and confirmed on repeat testing.  While 2019 novel coronavirus  (SARS-CoV-2) nucleic acids may be present in the submitted sample  additional confirmatory testing may be necessary for epidemiological  and / or clinical management purposes  to differentiate between  SARS-CoV-2 and other Sarbecovirus currently known to infect humans.  If clinically indicated additional testing  with an alternate test  methodology [phone]) is advised. The SARS-CoV-2 RNA is generally  detectable in upper and lower respiratory sp ecimens during the acute  phase of infection. The expected result is Negative. Fact Sheet for Patients:  StrictlyIdeas.no Fact Sheet for Healthcare Providers: BankingDealers.co.za This test is not yet approved or cleared by the [place] FDA and has been authorized for detection and/or diagnosis of SARS-CoV-2 by FDA under an Emergency Use Authorization (EUA).  This EUA will remain in effect (meaning this test can be used) for the duration of the COVID-19 declaration under Section 564(b)(1) of the Act, 21 U.S.C. section 360bbb-3(b)(1), unless the authorization is terminated or revoked sooner. Performed at [facility], [place] [address], [place], [place] [address]   POCT I-Stat EG7     Status: Abnormal   Collection Time: 08/14/19  8:46 PM  Result Value Ref Range   pH, Ven 7.336 7.250 - 7.430   pCO2, Ven 36.5 (L) 44.0 - 60.0 mmHg   pO2, Ven 152.0 (H) 32.0 - 45.0 mmHg   Bicarbonate 19.5 (L) 20.0 - 28.0 mmol/L   TCO2 21 (L) 22 - 32 mmol/L   O2 Saturation 99.0 %   Acid-base deficit 6.0 (H) 0.0 - 2.0 mmol/L   Sodium 133 (L) 135 - 145 mmol/L   Potassium 5.8 (H) 3.5 - 5.1 mmol/L   Calcium, Ion 0.97 (L) 1.15 - 1.40 mmol/L   HCT 38.0 (L) 39.0 - 52.0 %  Hemoglobin 12.9 (L) 13.0 - 17.0 g/dL   Patient temperature HIDE    Sample type VENOUS   Troponin I (High Sensitivity)     Status: Abnormal   Collection Time: 08/14/19 10:10 PM  Result Value Ref Range   Troponin I (High Sensitivity) 49 (H) <18 ng/L    Comment: (NOTE) Elevated high sensitivity troponin I (hsTnI) values and significant  changes across serial measurements may suggest ACS but many other  chronic and acute conditions are known to elevate hsTnI results.  Refer to the "Links" section for chest pain algorithms and additional   guidance. Performed at [facility], [place] [address], [place], [place] [address]   CBG monitoring, ED     Status: Abnormal   Collection Time: 08/15/19 12:17 AM  Result Value Ref Range   Glucose-Capillary 141 (H) 70 - 99 mg/dL  Hemoglobin A1c     Status: Abnormal   Collection Time: 08/15/19  1:44 AM  Result Value Ref Range   Hgb A1c MFr Bld 9.3 (H) 4.8 - 5.6 %    Comment: (NOTE) Pre diabetes:          5.7%-6.4% Diabetes:              >6.4% Glycemic control for   <7.0% adults with diabetes    Mean Plasma Glucose 220.21 mg/dL    Comment: Performed at [facility] [address], [place], [place] [address]   Ct Angio Chest Pe W And/or Wo Contrast  Result Date: 08/15/2019 CLINICAL DATA:  Shortness of breath EXAM: CT ANGIOGRAPHY CHEST WITH CONTRAST TECHNIQUE: Multidetector CT imaging of the chest was performed using the standard protocol during bolus administration of intravenous contrast. Multiplanar CT image reconstructions and MIPs were obtained to evaluate the vascular anatomy. CONTRAST:  53mL OMNIPAQUE IOHEXOL 350 MG/ML SOLN COMPARISON:  CT dated 07/21/2015. FINDINGS: Cardiovascular: Evaluation is severely limited by contrast bolus timing. Given this limitation, no large centrally located pulmonary embolus was detected. Detection of smaller segmental and subsegmental pulmonary emboli is significantly limited. The main pulmonary artery is dilated measuring approximately 4.5 cm in diameter.The cardiac size is enlarged. The patient is status post prior TAVR. Coronary artery calcifications and thoracic aortic calcifications are noted. There are multiple chest wall collaterals suggestion stenosis of the right brachiocephalic vein likely secondary to the presence of a right-sided dual chamber pacemaker. There is reflux of contrast into the IVC. Mediastinum/Nodes: --No mediastinal or hilar lymphadenopathy. --No axillary lymphadenopathy. --No supraclavicular lymphadenopathy. --there is a  left-sided thyroid nodule measuring approximately 2.7 cm. --The esophagus is unremarkable Lungs/Pleura: There is atelectasis at the right lung base. There is a trace right-sided pleural effusion. There is no focal infiltrate. Upper Abdomen: There is a 2.3 cm left-sided adrenal nodule which has decreased in size from prior CT in 2016. The patient is status post prior cholecystectomy. Musculoskeletal: No chest wall abnormality. No acute or significant osseous findings. Review of the MIP images confirms the above findings. IMPRESSION: 1. The study is limited by contrast bolus timing. Given this limitation, no large centrally located pulmonary embolus was detected. Detection of smaller segmental and subsegmental pulmonary emboli is significantly limited. 2. Dilated main pulmonary artery which can be seen in patients with elevated pulmonary artery pressures. 3. Cardiomegaly with reflux of contrast into the IVC, suggesting right heart failure. 4. Trace right pleural effusion. 5. Left-sided thyroid nodule measuring approximately 2.7 cm. Further evaluation with a nonemergent outpatient thyroid ultrasound is recommended. Aortic Atherosclerosis (ICD10-I70.0). Electronically Signed  By: Constance Holster M.D.   On: 08/15/2019 00:47   Dg Chest Portable 1 View  Result Date: 08/14/2019 CLINICAL DATA:  Shortness of breath EXAM: PORTABLE CHEST 1 VIEW COMPARISON:  07/21/2019 FINDINGS: Right-sided pacing device as before. Enlarged cardiomediastinal silhouette with vascular congestion. Aortic atherosclerosis. No pleural effusion or pneumothorax. IMPRESSION: Cardiomegaly with vascular congestion. Electronically Signed   By: Donavan Foil M.D.   On: 08/14/2019 20:29    Pending Labs Unresulted Labs (From admission, onward)    Start     Ordered   08/15/19 0500  CBC  Tomorrow morning,   R     08/15/19 0055   08/15/19 [id number]  Basic metabolic panel  Tomorrow morning,   R     08/15/19 0055   08/15/19 0055  Cortisol  ONCE - STAT,    STAT     08/15/19 0055   08/15/19 0054  Comprehensive metabolic panel  Once,   STAT     08/15/19 0055   08/15/19 0054  Phosphorus  Once,   STAT     08/15/19 0055   08/15/19 0054  Magnesium  Once,   STAT     08/15/19 0055   08/15/19 0054  Procalcitonin  Once,   STAT     08/15/19 0055   08/14/19 2012  Blood gas, venous  Once,   STAT     08/14/19 2011   08/14/19 2011  Blood culture (routine x 2)  BLOOD CULTURE X 2,   STAT     08/14/19 2011   Signed and Held  Renal function panel  Once,   R     Signed and Held   Signed and Held  CBC  Once,   R     Signed and Held          Vitals/Pain Today's Vitals   08/14/19 2330 08/14/19 2345 08/15/19 0015 08/15/19 0100  BP: (!) 75/64 (!) 87/49 (!) 82/49 (!) 86/45  Pulse: (!) 53 (!) 57 (!) 54 (!) 56  Resp: 15   15  Temp:      TempSrc:      SpO2: 100% 93% 100% 100%  Weight:      Height:      PainSc:        Isolation Precautions No active isolations  Medications Medications  sodium zirconium cyclosilicate (LOKELMA) packet 10 g (10 g Oral Given 08/14/19 2147)  dextrose 50 % solution (  Canceled Entry 08/14/19 2308)  insulin aspart (novoLOG) injection 0-15 Units (2 Units Subcutaneous Given 08/15/19 0059)  midodrine (PROAMATINE) tablet 10 mg (has no administration in time range)  albuterol (PROVENTIL) (2.5 MG/3ML) 0.083% nebulizer solution 3 mL (has no administration in time range)  fluticasone furoate-vilanterol (BREO ELLIPTA) 200-25 MCG/INH 1 puff (has no administration in time range)  hydrOXYzine (ATARAX/VISTARIL) tablet 25 mg (has no administration in time range)  apixaban (ELIQUIS) tablet 2.5 mg (has no administration in time range)  clopidogrel (PLAVIX) tablet 75 mg (has no administration in time range)  Chlorhexidine Gluconate Cloth 2 % PADS 6 each (has no administration in time range)  pentafluoroprop-tetrafluoroeth (GEBAUERS) aerosol 1 application (has no administration in time range)  lidocaine (PF) (XYLOCAINE) 1 % injection 5  mL (has no administration in time range)  lidocaine-prilocaine (EMLA) cream 1 application (has no administration in time range)  0.9 %  sodium chloride infusion (has no administration in time range)  0.9 %  sodium chloride infusion (has no administration in time range)  insulin aspart (novoLOG)  injection 5 Units (5 Units Intravenous Given 08/14/19 2158)    And  dextrose 50 % solution 50 mL (50 mLs Intravenous Given 08/14/19 2208)  albuterol (VENTOLIN HFA) 108 (90 Base) MCG/ACT inhaler 4 puff (4 puffs Inhalation Given 08/14/19 2148)  calcium gluconate 1 g/ 50 mL sodium chloride IVPB ( Intravenous Stopped 08/14/19 2302)  sodium bicarbonate injection 50 mEq (50 mEq Intravenous Given 08/14/19 2307)  iohexol (OMNIPAQUE) 350 MG/ML injection 75 mL (75 mLs Intravenous Contrast Given 08/14/19 2356)    Mobility One assist Moderate fall risk   Focused Assessments Pt AO x 4, Vpace on the monitor very hypotensive, admitted for acute respiratory failure, pt is 100% on a NRM, SPO2 goes down the 80's on [place] O2, pt refusing to be on bed only want to sit on the side  of the bed, daughter on the bedside for fall prevention. Vascular congestion noticed on the chest xr and CT.   R Recommendations: See Admitting Provider Note  Report given to:   Additional Notes:

## 2019-08-15 NOTE — Consult Note (Signed)
Reason for Consult: To manage dialysis and dialysis related needs Referring Physician: Dr. Tegler/ Dr Vincent [name] is an 78 y.o. male.  HPI: Pt is an 30M with ESRD on HD, Aortic stenosis s/p TAVR, Afib, PAD, s/p L BKA who is now seen in consultation at the request of EDP/ PCCM for provision of HD and mangement of ESRD.  Pt had a fistulogram yesterday during which an 80% inflow stenosis was treated.  That evening he developed chest pain and hypoxia.  Went to ED.  Initially on NRB, CXR wet.  K 6.1 and 5.8 on Istat.  Medically treated.  CTA negative for PE although contrast not optimally timed.  In this setting we are asked to see.    Pt dialyzes 4 days a week d/t vol overload and chronic hypotension.  He takes midodrine before HD.  Signs off rx early.  KT/V have been inadequate.  Currently pt feeling better.  Sitting up in bed.  Breathing better.    Dialyzes at [place] 95.5 kg F200 dialyzer HD Bath 2K 2.25 Ca bath, No heparin Access LUE AVF Hectorol 1 mg q rx Venofer 50 mg q week No ESA   Past Medical History:  Diagnosis Date  . Anginal pain ([place])   . Aortic stenosis    a. severe by echo 09/2014  . Atrial fibrillation ([place])    a. not well documented, not on anticoagulation  . CHF (congestive heart failure) ([place])    04/28/17 echo-EF 40%, mod LVH, diastolic dysfunction  . Claustrophobia   . Complete heart block ([place])   . COPD (chronic obstructive pulmonary disease) ([place])   . Coronary artery disease    a. chronically occluded RCA per cath 09/2014 with collaterals B. cath 05/01/17 chr occ RCA w/collaterals, 60-70% mid LAD,   . CVA (cerebral vascular accident) ([place]) 10/2014   denies residual on 07/11/2015  . ESRD (end stage renal disease) on dialysis [facility])    a. on dialysis; [facility]; MWF, LUE fistula (07/11/2015)  . History of blood transfusion    "related to gallbladder OR"  . History of stomach ulcers   . Hyperlipidemia   . Hypertension   . Iron  deficiency anemia   . Myocardial infarction ([place]) 10/2014  . Peripheral vascular disease ([place])   . Pneumonia   . Presence of permanent cardiac pacemaker   . 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 Type II diabetes mellitus ([place])     Past Surgical History:  Procedure Laterality Date  . ABDOMINAL AORTOGRAM W/LOWER EXTREMITY Bilateral 03/27/2019   Procedure: ABDOMINAL AORTOGRAM W/LOWER EXTREMITY;  Surgeon: Angelia Mould, MD;  Location: [facility];  Service: Cardiovascular;  Laterality: Bilateral;  . AMPUTATION Left 04/14/2019   Procedure: AMPUTATION BELOW KNEE;  Surgeon: Angelia Mould, MD;  Location: [facility];  Service: Vascular;  Laterality: Left;  . AV FISTULA PLACEMENT Left 10/19/2014   Procedure: BRACHIOCEPHALIC ARTERIOVENOUS (AV) FISTULA CREATION ;  Surgeon: Conrad Prairie Farm, MD;  Location: [facility];  Service: Vascular;  Laterality: Left;  . CARDIAC CATHETERIZATION    . CARDIAC CATHETERIZATION N/A 07/22/2015   Procedure: Right/Left Heart Cath and Coronary Angiography;  Surgeon: [name], MD;  Location: [facility];  Service: Cardiovascular;  Laterality: N/A;  . CATARACT EXTRACTION W/ INTRAOCULAR LENS  IMPLANT, BILATERAL Bilateral 1990's  . CHOLECYSTECTOMY OPEN  1980's  . COLONOSCOPY W/ BIOPSIES AND POLYPECTOMY    . CORONARY ANGIOGRAPHY N/A 07/31/2018   Procedure: CORONARY ANGIOGRAPHY;  Surgeon: Martinique, Peter M, MD;  Location: [facility];  Service: Cardiovascular;  Laterality: N/A;  . CORONARY ANGIOPLASTY    . CORONARY ATHERECTOMY N/A 04/18/2018   Procedure: CORONARY ATHERECTOMY;  Surgeon: [name], MD;  Location: [facility];  Service: Cardiovascular;  Laterality: N/A;  . CORONARY BALLOON ANGIOPLASTY N/A 07/31/2018   Procedure: CORONARY BALLOON ANGIOPLASTY;  Surgeon: Martinique, Peter M, MD;  Location: [facility];  Service: Cardiovascular;  Laterality: N/A;  . CORONARY STENT INTERVENTION N/A 04/18/2018   Procedure: CORONARY STENT INTERVENTION;  Surgeon: [name], MD;  Location: [facility];  Service: Cardiovascular;  Laterality: N/A;  . EP IMPLANTABLE DEVICE N/A 07/11/2015   Procedure: Pacemaker Implant;  Surgeon: [name], MD;  Location: [facility];  Service: Cardiovascular;  Laterality: N/A;  . ESOPHAGOGASTRODUODENOSCOPY  08/01/2012   Procedure: ESOPHAGOGASTRODUODENOSCOPY (EGD);  Surgeon: [name], MD;  Location[facility];  Service: Endoscopy;  Laterality: N/A;  . INSERT / REPLACE / REMOVE PACEMAKER  07/11/2015  . INSERTION OF DIALYSIS CATHETER Right 02/02/2015   Procedure: INSERTION OF DIALYSIS CATHETER  RIGHT INTERNAL JUGULAR;  Surgeon: Mal [name], MD;  Location: [facility];  Service: Vascular;  Laterality: Right;  . LEFT AND RIGHT HEART CATHETERIZATION WITH CORONARY ANGIOGRAM N/A 09/30/2014   Procedure: LEFT AND RIGHT HEART CATHETERIZATION WITH CORONARY ANGIOGRAM;  Surgeon: [name], MD;  Location: [facility];  Service: Cardiovascular;  Laterality: N/A;  . LEFT HEART CATH AND CORONARY ANGIOGRAPHY N/A 04/14/2018   Procedure: LEFT HEART CATH AND CORONARY ANGIOGRAPHY;  Surgeon: [name], MD;  Location: [facility];  Service: Cardiovascular;  Laterality: N/A;  . TEE WITHOUT CARDIOVERSION N/A 08/02/2015   Procedure: TRANSESOPHAGEAL ECHOCARDIOGRAM (TEE);  Surgeon: [name], MD;  Location: [facility];  Service: Open Heart Surgery;  Laterality: N/A;  . TONSILLECTOMY    . TRANSCATHETER AORTIC VALVE REPLACEMENT, TRANSFEMORAL Left 08/02/2015   Procedure: TRANSCATHETER AORTIC VALVE REPLACEMENT, TRANSFEMORAL;  Surgeon: [name], MD;  Location: [facility];  Service: Open Heart Surgery;  Laterality: Left;    Family History  Problem Relation Age of Onset  . Diabetes Father   . Heart disease Father   . Diabetes Sister   . Alzheimer's disease Mother    . Stroke Mother   . Diabetes Paternal Grandmother   . Alzheimer's disease Maternal Aunt        x 8 Maternal Aunts  . Cancer Maternal Uncle        type unknown    Social History:  reports that he quit smoking about 35 years ago. He has a 64.00 pack-year smoking history. He has never used smokeless tobacco. He reports current alcohol use. He reports that he does not use drugs.  Allergies:  Allergies  Allergen Reactions  . Byetta 10 Mcg Pen [Exenatide] Diarrhea and Nausea And Vomiting  . Codeine Itching  . Coumadin [Warfarin Sodium] Rash    (wife states coumadin was stopped but rash did not disappear 02/21/19)    Medications:  Scheduled: . apixaban  2.5 mg Oral BID  . Chlorhexidine Gluconate Cloth  6 each Topical Q0600  . clopidogrel  75 mg Oral Daily  . dextrose      . fluticasone furoate-vilanterol  1 puff Inhalation Daily  . insulin aspart  0-15 Units Subcutaneous Q4H  .  mouth rinse  15 mL Mouth Rinse BID  . midodrine  10 mg Oral TID WC  . sodium zirconium cyclosilicate  10 g Oral Daily     Results for orders placed or performed during the hospital encounter of 08/14/19 (from the past 48 hour(s))  CBC with Differential     Status: Abnormal   Collection Time: 08/14/19  8:16 PM  Result Value Ref Range   WBC 6.9 4.0 - 10.5 K/uL   RBC 3.85 (L) 4.22 - 5.81 MIL/uL   Hemoglobin 11.9 (L) 13.0 - 17.0 g/dL   HCT 37.9 (L) 39.0 - 52.0 %   MCV 98.4 80.0 - 100.0 fL   MCH 30.9 26.0 - 34.0 pg   MCHC 31.4 30.0 - 36.0 g/dL   RDW 16.0 (H) 11.5 - 15.5 %   Platelets 172 150 - 400 K/uL   nRBC 0.0 0.0 - 0.2 %   Neutrophils Relative % 69 %   Neutro Abs 4.7 1.7 - 7.7 K/uL   Lymphocytes Relative 14 %   Lymphs Abs 1.0 0.7 - 4.0 K/uL   Monocytes Relative 12 %   Monocytes Absolute 0.9 0.1 - 1.0 K/uL   Eosinophils Relative 4 %   Eosinophils Absolute 0.3 0.0 - 0.5 K/uL   Basophils Relative 1 %   Basophils Absolute 0.0 0.0 - 0.1 K/uL   Immature Granulocytes 0 %   Abs Immature Granulocytes  0.03 0.00 - 0.07 K/uL    Comment: Performed at [facility], [address], [place], [place] [address]  Brain natriuretic peptide     Status: Abnormal   Collection Time: 08/14/19  8:16 PM  Result Value Ref Range   B Natriuretic Peptide 1,710.9 (H) 0.0 - 100.0 pg/mL    Comment: Performed at [facility] [address], [place], [place] [address]  Comprehensive metabolic panel     Status: Abnormal   Collection Time: 08/14/19  8:16 PM  Result Value Ref Range   Sodium 134 (L) 135 - 145 mmol/L   Potassium 6.1 (H) 3.5 - 5.1 mmol/L   Chloride 97 (L) 98 - 111 mmol/L   CO2 19 (L) 22 - 32 mmol/L   Glucose, Bld 219 (H) 70 - 99 mg/dL   BUN 35 (H) 8 - 23 mg/dL   Creatinine, Ser 5.19 (H) 0.61 - 1.24 mg/dL   Calcium 8.7 (L) 8.9 - 10.3 mg/dL   Total Protein 6.4 (L) 6.5 - 8.1 g/dL   Albumin 3.0 (L) 3.5 - 5.0 g/dL   AST 33 15 - 41 U/L   ALT 20 0 - 44 U/L   Alkaline Phosphatase 135 (H) 38 - 126 U/L   Total Bilirubin 2.0 (H) 0.3 - 1.2 mg/dL   GFR calc non Af Amer 10 (L) >60 mL/min   GFR calc Af Amer 11 (L) >60 mL/min   Anion gap 18 (H) 5 - 15    Comment: Performed at [facility], [place] [address] [place], [place] [address]  Troponin I (High Sensitivity)     Status: Abnormal   Collection Time: 08/14/19  8:16 PM  Result Value Ref Range   Troponin I (High Sensitivity) 48 (H) <18 ng/L    Comment: (NOTE) Elevated high sensitivity troponin I (hsTnI) values and significant  changes across serial measurements may suggest ACS but many other  chronic and acute conditions are known to elevate hsTnI results.  Refer to the "Links" section for chest pain algorithms and additional  guidance. Performed  at [facility], [place] [address], [place], [place] [address]   SARS Coronavirus 2 [facility] order, Performed in [facility] hospital lab) Nasopharyngeal Nasopharyngeal Swab     Status: None   Collection Time: 08/14/19  8:16 PM   Specimen: Nasopharyngeal Swab  Result Value Ref Range    SARS Coronavirus 2 NEGATIVE NEGATIVE    Comment: (NOTE) If result is NEGATIVE SARS-CoV-2 target nucleic acids are NOT DETECTED. The SARS-CoV-2 RNA is generally detectable in upper and lower  respiratory specimens during the acute phase of infection. The lowest  concentration of SARS-CoV-2 viral copies this assay can detect is 250  copies / mL. A negative result does not preclude SARS-CoV-2 infection  and should not be used as the sole basis for treatment or other  patient management decisions.  A negative result may occur with  improper specimen collection / handling, submission of specimen other  than nasopharyngeal swab, presence of viral mutation(s) within the  areas targeted by this assay, and inadequate number of viral copies  (<250 copies / mL). A negative result must be combined with clinical  observations, patient history, and epidemiological information. If result is POSITIVE SARS-CoV-2 target nucleic acids are DETECTED. The SARS-CoV-2 RNA is generally detectable in upper and lower  respiratory specimens dur ing the acute phase of infection.  Positive  results are indicative of active infection with SARS-CoV-2.  Clinical  correlation with patient history and other diagnostic information is  necessary to determine patient infection status.  Positive results do  not rule out bacterial infection or co-infection with other viruses. If result is PRESUMPTIVE POSTIVE SARS-CoV-2 nucleic acids MAY BE PRESENT.   A presumptive positive result was obtained on the submitted specimen  and confirmed on repeat testing.  While 2019 novel coronavirus  (SARS-CoV-2) nucleic acids may be present in the submitted sample  additional confirmatory testing may be necessary for epidemiological  and / or clinical management purposes  to differentiate between  SARS-CoV-2 and other Sarbecovirus currently known to infect humans.  If clinically indicated additional testing with an alternate test   methodology [phone]) is advised. The SARS-CoV-2 RNA is generally  detectable in upper and lower respiratory sp ecimens during the acute  phase of infection. The expected result is Negative. Fact Sheet for Patients:  StrictlyIdeas.no Fact Sheet for Healthcare Providers: BankingDealers.co.za This test is not yet approved or cleared by the [place] FDA and has been authorized for detection and/or diagnosis of SARS-CoV-2 by FDA under an Emergency Use Authorization (EUA).  This EUA will remain in effect (meaning this test can be used) for the duration of the COVID-19 declaration under Section 564(b)(1) of the Act, 21 U.S.C. section 360bbb-3(b)(1), unless the authorization is terminated or revoked sooner. Performed at [facility], [place] [address], [place], [place] [address]   POCT I-Stat EG7     Status: Abnormal   Collection Time: 08/14/19  8:46 PM  Result Value Ref Range   pH, Ven 7.336 7.250 - 7.430   pCO2, Ven 36.5 (L) 44.0 - 60.0 mmHg   pO2, Ven 152.0 (H) 32.0 - 45.0 mmHg   Bicarbonate 19.5 (L) 20.0 - 28.0 mmol/L   TCO2 21 (L) 22 - 32 mmol/L   O2 Saturation 99.0 %   Acid-base deficit 6.0 (H) 0.0 - 2.0 mmol/L   Sodium 133 (L) 135 - 145 mmol/L   Potassium 5.8 (H) 3.5 - 5.1 mmol/L   Calcium, Ion 0.97 (L) 1.15 - 1.40 mmol/L  HCT 38.0 (L) 39.0 - 52.0 %   Hemoglobin 12.9 (L) 13.0 - 17.0 g/dL   Patient temperature HIDE    Sample type VENOUS   Troponin I (High Sensitivity)     Status: Abnormal   Collection Time: 08/14/19 10:10 PM  Result Value Ref Range   Troponin I (High Sensitivity) 49 (H) <18 ng/L    Comment: (NOTE) Elevated high sensitivity troponin I (hsTnI) values and significant  changes across serial measurements may suggest ACS but many other  chronic and acute conditions are known to elevate hsTnI results.  Refer to the "Links" section for chest pain algorithms and additional  guidance. Performed at [facility], [place] [address], [place], [place] [address]   CBG monitoring, ED     Status: Abnormal   Collection Time: 08/15/19 12:17 AM  Result Value Ref Range   Glucose-Capillary 141 (H) 70 - 99 mg/dL    Ct Angio Chest Pe W And/or Wo Contrast  Result Date: 08/15/2019 CLINICAL DATA:  Shortness of breath EXAM: CT ANGIOGRAPHY CHEST WITH CONTRAST TECHNIQUE: Multidetector CT imaging of the chest was performed using the standard protocol during bolus administration of intravenous contrast. Multiplanar CT image reconstructions and MIPs were obtained to evaluate the vascular anatomy. CONTRAST:  26mL OMNIPAQUE IOHEXOL 350 MG/ML SOLN COMPARISON:  CT dated 07/21/2015. FINDINGS: Cardiovascular: Evaluation is severely limited by contrast bolus timing. Given this limitation, no large centrally located pulmonary embolus was detected. Detection of smaller segmental and subsegmental pulmonary emboli is significantly limited. The main pulmonary artery is dilated measuring approximately 4.5 cm in diameter.The cardiac size is enlarged. The patient is status post prior TAVR. Coronary artery calcifications and thoracic aortic calcifications are noted. There are multiple chest wall collaterals suggestion stenosis of the right brachiocephalic vein likely secondary to the presence of a right-sided dual chamber pacemaker. There is reflux of contrast into the IVC. Mediastinum/Nodes: --No mediastinal or hilar lymphadenopathy. --No axillary lymphadenopathy. --No supraclavicular lymphadenopathy. --there is a left-sided thyroid nodule measuring approximately 2.7 cm. --The esophagus is unremarkable Lungs/Pleura: There is atelectasis at the right lung base. There is a trace right-sided pleural effusion. There is no focal infiltrate. Upper Abdomen: There is a 2.3 cm left-sided adrenal nodule which has decreased in size from prior CT in 2016. The patient is status post prior cholecystectomy. Musculoskeletal: No chest wall abnormality. No acute  or significant osseous findings. Review of the MIP images confirms the above findings. IMPRESSION: 1. The study is limited by contrast bolus timing. Given this limitation, no large centrally located pulmonary embolus was detected. Detection of smaller segmental and subsegmental pulmonary emboli is significantly limited. 2. Dilated main pulmonary artery which can be seen in patients with elevated pulmonary artery pressures. 3. Cardiomegaly with reflux of contrast into the IVC, suggesting right heart failure. 4. Trace right pleural effusion. 5. Left-sided thyroid nodule measuring approximately 2.7 cm. Further evaluation with a nonemergent outpatient thyroid ultrasound is recommended. Aortic Atherosclerosis (ICD10-I70.0). Electronically Signed   By: Constance Holster M.D.   On: 08/15/2019 00:47   Dg Chest Portable 1 View  Result Date: 08/14/2019 CLINICAL DATA:  Shortness of breath EXAM: PORTABLE CHEST 1 VIEW COMPARISON:  07/21/2019 FINDINGS: Right-sided pacing device as before. Enlarged cardiomediastinal silhouette with vascular congestion. Aortic atherosclerosis. No pleural effusion or pneumothorax. IMPRESSION: Cardiomegaly with vascular congestion. Electronically Signed   By: Donavan Foil M.D.   On: 08/14/2019 20:29    ROS: all other systems reviewed and are negative except as per HPI Blood  pressure (!) 82/49, pulse (!) 54, temperature 98.8 F (37.1 C), temperature source Rectal, resp. rate 15, height 5\' 11"  (1.803 m), weight 95.3 kg, SpO2 100 %. .  GEN: nad, sitting up in bed HEENT EOMI PERRL NECK + JVD PULM coarse bilaterally CV irregular ABD soft, nontender EXT s/-p L BKA, R leg with blisters which are wrapped. 2+ dependent edema legs/ thighs/ sacrum NEURO AAO x 3 nonfocal SKIN no rashes/ lesions  ACCESS: LUE AVF + T/B, suture still in place  Assessment/Plan: 1 SOB/ hypoxia: CTA without PE although contrast bolus not timed exactly right.  Sig contribution from volume overload.  Dialysis  now.  CXR wet.  No antibiotics at present.    2 ESRD: MTTS [place] 2K bath.  Will dialyze starting with albumin and midodrine on board too.  He needs to stay full time and control fluids better as OP 3 Hypotension: on midodrine 10 TID, can go up to 15 TID if needed 4. Anemia of ESRD: no ESA 5. Metabolic Bone Disease: hectorol 1 mcg with rx, continue binders 6.  Afib: on Eliquis 7.  PAD: s/p L BKA, has R leg that is followed by wound care.   8.  Recent fistulogram: sutures to be removed after rx today  Jeffrey [name] 08/15/2019, 1:30 AM

## 2019-08-15 NOTE — Consult Note (Signed)
[facility] Nurse wound consult note Reason for Consult:Left stump wound Wound type:surgical Pressure Injury POA: N/A Wound [id number] approximated with dry peeling skin at periphery Drainage (amount, consistency, odor) none Periwound:See above Dressing procedure/placement/frequency:I will provide Nursing with guidance for the care of the left stump wound using xeroform gauze as a wound contact layer for its astringent and antimicrobial properties. Daily changes are indicated.  Right LE wounds are being treated by a boot and topical care initiated by the outpatient [facility] and are changed weekly on Wednesdays. [facility] nurse will see this extremity at another time, on or before Wednesday.  [facility] nursing team will follow, and will remain available to this patient, the nursing and medical teams.   Thanks, Maudie Flakes, MSN, RN, [place], Arther Abbott  Pager# [phone]

## 2019-08-15 NOTE — Progress Notes (Signed)
   NAME:  Jeffrey [name], MRN:  [id number], DOB:  1941-07-27, LOS: 0 ADMISSION DATE:  08/14/2019, CONSULTATION DATE:  08/14/19 REFERRING MD:  ED, CHIEF COMPLAINT:  SOB, CP   Brief History   78 yo M with intermittent SOB and chest pain, in ED on NRB. Hx ESRD with T/R/Sa HD; fistula recently clotted but per patient is patent and intended for use Saturday.  PCCM consulted for admission due to possible need for CRRT.    Past Medical History  ESRD on HD M/T/R/S Aortic stenosis s/p TAVR Afib PAD s/p L BKA   Significant Hospital Events   9/18>admitted   Consults:  Nephrology   Procedures:    Significant Diagnostic Tests:  9/19 CT angio chest > negative for PE  Micro Data:  n/a  Antimicrobials:  n/a   Interim history/subjective:  Jeffrey [name] was evaluated this morning while on HD. He is feeling much better. Denies any chest pain, shortness of breath, n/v. Requesting to eat.   Objective   Blood pressure (!) 84/56, pulse (!) 56, temperature 97.7 F (36.5 C), temperature source Oral, resp. rate 20, height 5\' 11"  (1.803 m), weight 96 kg, SpO2 (!) 88 %.        Intake/Output Summary (Last 24 hours) at 08/15/2019 0905 Last data filed at 08/14/2019 2307 Gross per 24 hour  Intake 50 ml  Output -  Net 50 ml   Filed Weights   08/14/19 2008 08/15/19 0404 08/15/19 0715  Weight: 95.3 kg 97.6 kg 96 kg    Examination: General: awake, alert, pleasant gentleman sitting up in bed in NAD HENT: PEERL, EOMI Lungs: diminished bilaterally Cardiovascular: irregular rhythm  Abdomen: soft, non-distended, non-tender Extremities: L BKA, RLE wrapped with dressings; 1+ pitting edema Neuro: A&Ox3; answers questions and follows commands appropriately   Resolved Hospital Problem list     Assessment & Plan:  Acute hypoxic respiratory failure - in the setting of volume overload in ESRD - stable on 5L HFNC; wean as able to maintain sats >92%  - should improve with HD - CT chest was negative  for PE  ESRD Hyperkalemia - received medical therapy in the ED, followed by HD early this morning - appreciate nephrology following - dialyzes 4x per weed due to hypotension and chronic volume overload due to early sign off and poor fluid restriction   Hypotension - chronic - mentation normal - continue home midodrine   Afib CAD HFrEF - continue aspirin, plavix, eliquis - holding home metop and statin  Best practice:  Diet: CM, renal  Pain/Anxiety/Delirium protocol (if indicated): n/a VAP protocol (if indicated): no DVT prophylaxis: heparin Glucose control: SSI Mobility: BR Code Status: Full Family Communication: will call family to update  Disposition: ICU  Delice Bison, DO PGY-2 08/15/19, 11:07 am

## 2019-08-15 NOTE — Progress Notes (Signed)
  Echocardiogram 2D Echocardiogram has been performed.  Jeffrey [name] 08/15/2019, 3:16 PM

## 2019-08-15 NOTE — ED Provider Notes (Signed)
CT study negative Critical care will admit D/w dr Hollie Salk with nephrology They will plan to dialyze    Ripley Fraise, MD 08/15/19 0121

## 2019-08-15 NOTE — Procedures (Signed)
Patient seen and examined on Hemodialysis. BP (!) 83/46   Pulse (!) 54   Temp (!) 97.1 F (36.2 C) (Axillary)   Resp 13   Ht 5\' 11"  (1.803 m)   Wt 97.6 kg   SpO2 100%   BMI 30.01 kg/m   QB 400 mL/ min via LUE AVF, UF goal 2.5L   Tolerating treatment without complaints at this time.   Madelon Lips MD [facility] pgr [phone] 7:49 AM

## 2019-08-16 DIAGNOSIS — N186 End stage renal disease: Secondary | ICD-10-CM

## 2019-08-16 DIAGNOSIS — I5023 Acute on chronic systolic (congestive) heart failure: Secondary | ICD-10-CM

## 2019-08-16 LAB — RENAL FUNCTION PANEL
Albumin: 3 g/dL — ABNORMAL LOW (ref 3.5–5.0)
Anion gap: 16 — ABNORMAL HIGH (ref 5–15)
BUN: 22 mg/dL (ref 8–23)
CO2: 23 mmol/L (ref 22–32)
Calcium: 9 mg/dL (ref 8.9–10.3)
Chloride: 94 mmol/L — ABNORMAL LOW (ref 98–111)
Creatinine, Ser: 3.6 mg/dL — ABNORMAL HIGH (ref 0.61–1.24)
GFR calc Af Amer: 17 mL/min — ABNORMAL LOW (ref >60)
GFR calc non Af Amer: 15 mL/min — ABNORMAL LOW (ref >60)
Glucose, Bld: 156 mg/dL — ABNORMAL HIGH (ref 70–99)
Phosphorus: 5.4 mg/dL — ABNORMAL HIGH (ref 2.5–4.6)
Potassium: 4.6 mmol/L (ref 3.5–5.1)
Sodium: 133 mmol/L — ABNORMAL LOW (ref 135–145)

## 2019-08-16 LAB — CBC
HCT: 34.7 % — ABNORMAL LOW (ref 39.0–52.0)
Hemoglobin: 11.4 g/dL — ABNORMAL LOW (ref 13.0–17.0)
MCH: 30.9 pg (ref 26.0–34.0)
MCHC: 32.9 g/dL (ref 30.0–36.0)
MCV: 94 fL (ref 80.0–100.0)
Platelets: 187 10*3/uL (ref 150–400)
RBC: 3.69 MIL/uL — ABNORMAL LOW (ref 4.22–5.81)
RDW: 16.4 % — ABNORMAL HIGH (ref 11.5–15.5)
WBC: 8.2 10*3/uL (ref 4.0–10.5)
nRBC: 0 % (ref 0.0–0.2)

## 2019-08-16 LAB — MAGNESIUM: Magnesium: 1.8 mg/dL (ref 1.7–2.4)

## 2019-08-16 LAB — GLUCOSE, CAPILLARY
Glucose-Capillary: 132 mg/dL — ABNORMAL HIGH (ref 70–99)
Glucose-Capillary: 148 mg/dL — ABNORMAL HIGH (ref 70–99)
Glucose-Capillary: 227 mg/dL — ABNORMAL HIGH (ref 70–99)
Glucose-Capillary: 230 mg/dL — ABNORMAL HIGH (ref 70–99)

## 2019-08-16 MED ORDER — MIDODRINE HCL 5 MG PO TABS
10.0000 mg | ORAL_TABLET | ORAL | Status: DC
  Administered 2019-08-17: 07:00:00 10 mg via ORAL
  Filled 2019-08-16: qty 2

## 2019-08-16 MED ORDER — MIDODRINE HCL 5 MG PO TABS: 10.0000 mg | ORAL_TABLET | ORAL | Status: DC

## 2019-08-16 MED ORDER — MIDODRINE HCL 5 MG PO TABS
2.5000 mg | ORAL_TABLET | Freq: Three times a day (TID) | ORAL | Status: DC
  Administered 2019-08-17 – 2019-08-18 (×3): 2.5 mg via ORAL
  Filled 2019-08-16 (×4): qty 1

## 2019-08-16 MED ORDER — MIDODRINE HCL 5 MG PO TABS
10.0000 mg | ORAL_TABLET | ORAL | Status: DC
  Administered 2019-08-17: 09:00:00 10 mg via ORAL

## 2019-08-16 MED ORDER — CHLORHEXIDINE GLUCONATE CLOTH 2 % EX PADS
6.0000 | MEDICATED_PAD | Freq: Every day | CUTANEOUS | Status: DC
  Administered 2019-08-18: 6 via TOPICAL

## 2019-08-16 MED ORDER — MIDODRINE HCL 5 MG PO TABS
10.0000 mg | ORAL_TABLET | ORAL | Status: DC
  Administered 2019-08-18: 10 mg via ORAL
  Filled 2019-08-16: qty 2

## 2019-08-16 MED ORDER — INFLUENZA VAC A&B SA ADJ QUAD 0.5 ML IM PRSY
0.5000 mL | PREFILLED_SYRINGE | INTRAMUSCULAR | Status: AC
  Administered 2019-08-17: 0.5 mL via INTRAMUSCULAR
  Filled 2019-08-16: qty 0.5

## 2019-08-16 MED ORDER — PNEUMOCOCCAL VAC POLYVALENT 25 MCG/0.5ML IJ INJ
0.5000 mL | INJECTION | INTRAMUSCULAR | Status: DC
  Filled 2019-08-16: qty 0.5

## 2019-08-16 NOTE — Progress Notes (Signed)
[facility] Progress Note  Subjective: pt's breathing is "much better", no further syncope here.  Up in chair and back to baseline.  Only 850 cc UF on HD yest due to hypotension I suspect. BP's are poor in the 80's and 90's.  At home takes midodrine pre and during HD 10mg  x 2.   Vitals:   08/16/19 1500 08/16/19 1530 08/16/19 1600 08/16/19 1630  BP: (!) 97/47  (!) 97/54   Pulse: (!) 55 (!) 55 (!) 54 (!) 53  Resp: 19 (!) 25 17 (!) 23  Temp:      TempSrc:      SpO2: 100% 99% 100% 100%  Weight:      Height:        Inpatient medications: . apixaban  2.5 mg Oral BID  . Chlorhexidine Gluconate Cloth  6 each Topical Q0600  . clopidogrel  75 mg Oral Daily  . fluticasone furoate-vilanterol  1 puff Inhalation Daily  . [START ON 08/17/2019] influenza vaccine adjuvanted  0.5 mL Intramuscular Tomorrow-1000  . insulin aspart  0-5 Units Subcutaneous QHS  . insulin aspart  0-9 Units Subcutaneous TID WC  . mouth rinse  15 mL Mouth Rinse BID  . midodrine  10 mg Oral TID WC  . [START ON 08/17/2019] pneumococcal 23 valent vaccine  0.5 mL Intramuscular Tomorrow-1000   . sodium chloride    . sodium chloride     sodium chloride, sodium chloride, albuterol, alteplase, heparin, hydrOXYzine, lidocaine (PF), lidocaine-prilocaine, pentafluoroprop-tetrafluoroeth    Exam: GEN: nad, sitting up in bed HEENT EOMI PERRL NECK + JVD PULM coarse bilaterally CV irregular ABD soft, nontender EXT s/-p L BKA, R leg with blisters which are wrapped. 1- 2+ dependent edema legs/ thighs/ sacrum NEURO AAO x 3 nonfocal SKIN no rashes/ lesions  ACCESS: LUE AVF + T/B    Dialysis: [facility] MTTS   95.5kg   F200  2/2.25 bath  Hep none  LUE AVF    Hectorol 1 mg q rx   Venofer 50 mg q week   No ESA  Assessment/ Plan: 1 SOB/ hypoxia: Sig contribution from volume overload, which is a recurrent problem due to signing off early.  CXR wet on admission. Had HD Sat but only 1L UF prob due to not getting pre and  intra midodrine along w/ low BP's.  No antibiotics at present. Nonetheless looking better today. Is 3 kg up, will hope to get more vol off w/ HD tomorrow. Have rewritten his midodrine as taken at home.  2 ESRD: MTTS [place] 2K bath.  Marginal HD compliance.  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 Metabolic Bone Disease: hectorol 1 mcg with rx, continue binders 6.  Afib: on Eliquis 7.  PAD: s/p L BKA, has R leg that is followed by wound care.   8.  Recent fistulogram: sutures should be removed here prior to [employer] 08/16/2019, 5:42 PM  Iron/TIBC/Ferritin/ %Sat    Component Value Date/Time   IRON 59 01/12/2015 0730   TIBC 227 01/12/2015 0730   FERRITIN 189 01/12/2015 0730   IRONPCTSAT 26 01/12/2015 0730   Recent Labs  Lab 08/16/19 0417  NA 133*  K 4.6  CL 94*  CO2 23  GLUCOSE 156*  BUN 22  CREATININE 3.60*  CALCIUM 9.0  PHOS 5.4*  ALBUMIN 3.0*   Recent Labs  Lab 08/15/19  0144  AST 36  ALT 22  ALKPHOS 138*  BILITOT 2.1*  PROT 6.4*   Recent Labs  Lab 08/16/19 0417  WBC 8.2  HGB 11.4*  HCT 34.7*  PLT 187

## 2019-08-16 NOTE — Evaluation (Signed)
Physical Therapy Evaluation Patient Details Name: Jeffrey [name] MRN: [id number] DOB: 1941/03/25 Today's Date: 08/16/2019   History of Present Illness  Pt is an 78 y.o. male admitted 08/14/19 with SOB and chest pain requiring NRB in ED. Worked up for volume overload and hyperkalemia. CT negative for PE. PMH includes PAD s/p L AKA, ESRD (HD MTRS), aortic stenosis s/p TAVR, afib.    Clinical Impression  Pt presents with an overall decrease in functional mobility secondary to above. PTA, pt lives at home with wife and son, family assists with ADLs and mobility, using hoyer lift for all transfers as pt with difficulty using RLE to assist. Today, pt was transferred to recliner with RN via [place], declining attempt at scooting/pivot transfers with PT due to fatigue. Reviewed amputee precautions, positioning, and therex. Wife and pt interested in SNF-level therapies, but worry about COVID-19 pandemic. They will also need extended time with hoyer lift (report [place] services were going to take from home since "time was up"). Pt would benefit from continued acute PT services to maximize functional mobility and independence prior to d/c with SNF.     Follow Up Recommendations SNF;Supervision for mobility/OOB    Equipment Recommendations  Hoyer lift (report HH was going to take lift because "time was up"), home oxygen (requesting a different option for portable O2 as their tanks run out too quickly for trips to appointments)   Recommendations for Other Services       Precautions / Restrictions Precautions Precautions: Fall;Other (comment) Precaution Comments: L BKA, low BP Restrictions Weight Bearing Restrictions: Yes LLE Weight Bearing: Non weight bearing      Mobility  Bed Mobility               General bed mobility comments: Received sitting in recliner; transferred with RN using maxisky lift  Transfers                 General transfer comment: Pt declined attempt at  scooting/pivot transfer on RLE due to fatigue; BP in recliner down to 89/69, pt asymptomatic (RN aware)  Ambulation/Gait                Stairs            Wheelchair Mobility    Modified Rankin (Stroke Patients Only)       Balance Overall balance assessment: Needs assistance   Sitting balance-Leahy Scale: Fair                                       Pertinent Vitals/Pain Pain Assessment: No/denies pain    Home Living Family/patient expects to be discharged to:: Private residence Living Arrangements: Spouse/significant other;Children Available Help at Discharge: Family;Available 24 hours/day Type of Home: House Home Access: Ramped entrance     Home Layout: One level Home Equipment: Walker - 2 wheels;Grab bars - toilet;Grab bars - tub/shower;Cane - single point;Bedside commode;Walker - 4 wheels;Other (comment)(hoyer lift) Additional Comments: home O2    Prior Function Level of Independence: Needs assistance   Gait / Transfers Assistance Needed: Harrel Lemon lift for all transfers. Sleeps in recliner. Assist to push w/c. Pt reports just recently able to start bearing weight through RLE to help with scooting.  ADL's / Homemaking Assistance Needed: Wife assist for all ADLs, including bed baths. Hoyer lift for transfer to [facility]  Comments: Pt did not d/c to SNF after amputation in 03/2019, went straight  home with assist from family     Hand Dominance        Extremity/Trunk Assessment   Upper Extremity Assessment Upper Extremity Assessment: Generalized weakness    Lower Extremity Assessment Lower Extremity Assessment: Generalized weakness;LLE deficits/detail LLE Deficits / Details: previous BKA; hip and knee functionally at least 3/5, good knee ROM       Communication   Communication: HOH  Cognition Arousal/Alertness: Awake/alert Behavior During Therapy: WFL for tasks assessed/performed Overall Cognitive Status: Within Functional Limits for tasks  assessed                                 General Comments: WFL for simple tasks, not formally assessed      General Comments General comments (skin integrity, edema, etc.): Wife present and supportive. Requesting extension of time for keeping hoyer lift at home; also requesting different portable O2 option since tanks run out too quickly    Exercises Amputee Exercises Hip ABduction/ADduction: AROM;Left;Seated Hip Flexion/Marching: AROM;Left;Seated Knee Flexion: AROM;Left;Seated Knee Extension: AROM;Left;Seated   Assessment/Plan    PT Assessment Patient needs continued PT services  PT Problem List Decreased strength;Decreased activity tolerance;Decreased balance;Decreased mobility;Cardiopulmonary status limiting activity       PT Treatment Interventions DME instruction;Functional mobility training;Therapeutic activities;Therapeutic exercise;Balance training;Patient/family education;Wheelchair mobility training    PT Goals (Current goals can be found in the Care Plan section)  Acute Rehab PT Goals Patient Stated Goal: Interested in pursuing SNF after this admission (unlike last time), but worried about COVID PT Goal Formulation: With patient/family Time For Goal Achievement: 08/30/19 Potential to Achieve Goals: Good    Frequency Min 3X/week   Barriers to discharge        Co-evaluation               AM-PAC PT "6 Clicks" Mobility  Outcome Measure Help needed turning from your back to your side while in a flat bed without using bedrails?: A Lot Help needed moving from lying on your back to sitting on the side of a flat bed without using bedrails?: A Lot Help needed moving to and from a bed to a chair (including a wheelchair)?: A Lot Help needed standing up from a chair using your arms (e.g., wheelchair or bedside chair)?: Total Help needed to walk in hospital room?: Total Help needed climbing 3-5 steps with a railing? : Total 6 Click Score: 9    End  of Session Equipment Utilized During Treatment: Oxygen Activity Tolerance: Patient limited by fatigue Patient left: in chair;with call bell/phone within reach;with family/visitor present Nurse Communication: Mobility status;Need for lift equipment PT Visit Diagnosis: Other abnormalities of gait and mobility (R26.89);Muscle weakness (generalized) (M62.81)    Time: [id number] PT Time Calculation (min) (ACUTE ONLY): 20 min   Charges:   PT Evaluation $PT Eval Moderate Complexity: 1 Mod     Mabeline Caras, PT, DPT Acute Rehabilitation Services  Pager [phone] Office [place] 08/16/2019, 2:36 PM

## 2019-08-16 NOTE — Progress Notes (Signed)
   NAME:  Jeffrey [name], MRN:  [id number], DOB:  July 29, 1941, LOS: 1 ADMISSION DATE:  08/14/2019, CONSULTATION DATE:  08/14/19 REFERRING MD:  ED, CHIEF COMPLAINT:  SOB, CP   Brief History   78 yo M with intermittent SOB and chest pain, in ED on NRB. Hx ESRD with T/R/Sa HD; fistula recently clotted but per patient is patent and intended for use Saturday.  PCCM consulted for admission due to possible need for CRRT.   Past Medical History  ESRD on HD M/T/R/S Aortic stenosis s/p TAVR Afib PAD s/p L BKA   Significant Hospital Events   9/18>admitted to ICU for closer monitoring while undergoing emergent dialysis    Consults:  Nephrology  Procedures:  n/a  Significant Diagnostic Tests:  9/19 CT angio chest > negative for PE  Micro Data:  n/a  Antimicrobials:  n/a   Interim history/subjective:  Jeffrey [name] was seen and evaluated at bedside. He states he had a couple of panic attacks overnight, but otherwise did ok. He has not had recurrent of chest pain or dyspnea. Has a good appetite. No abdominal pain or nausea.   Objective   Blood pressure (!) 73/44, pulse (!) 55, temperature 97.8 F (36.6 C), temperature source Oral, resp. rate 13, height 5\' 11"  (1.803 m), weight 98.3 kg, SpO2 100 %.        Intake/Output Summary (Last 24 hours) at 08/16/2019 0741 Last data filed at 08/15/2019 1800 Gross per 24 hour  Intake 680 ml  Output 854 ml  Net -174 ml   Filed Weights   08/15/19 0715 08/15/19 1130 08/16/19 0500  Weight: 96 kg 95.2 kg 98.3 kg    Examination: General: awake, alert, pleasant gentleman sitting up in bed in NAD HENT: PEERL, EOMI Lungs: coarse throughout without crackles or wheezes Cardiovascular: irregular rhythm Abdomen: BS+; soft, non-tender Extremities: s/p L BKA, right leg wrapped  Neuro: A&Ox3; answers questions and follows commands appropriately   Resolved Hospital Problem list     Assessment & Plan:  Acute on chronic hypoxic respiratory failure - in  the setting of volume overload in ESRD; CT chest negative for PE - stable on 5L HFNC; wean to home 3L as able to maintain sats >92%   ESRD Hyperkalemia - dialyzes 4x per week due to hypotension and chronic volume overload due to early sign off and poor fluid restriction at home - K stable after HD yesterday  - appreciate nephrology following   Hypotension - chronic - mentation normal - continue home midodrine   Afib CAD HFrEF - echo 9/19 with decline in LVEF since prior study (25-30%) - follows with Dr. Caryl Comes - continue home meds   Best practice:  Diet: renal, CM Pain/Anxiety/Delirium protocol (if indicated): n/a VAP protocol (if indicated): no DVT prophylaxis: heparin Glucose control: SSI Mobility: Bed to chair  Code Status: Full Family Communication: will update wife at bedside  Disposition: patient stable for transfer out of ICU   Clois Treanor D, DO PGY-2 08/16/19, 8:48 am

## 2019-08-17 DIAGNOSIS — I5021 Acute systolic (congestive) heart failure: Secondary | ICD-10-CM | POA: Diagnosis present

## 2019-08-17 DIAGNOSIS — J9621 Acute and chronic respiratory failure with hypoxia: Secondary | ICD-10-CM

## 2019-08-17 DIAGNOSIS — Z950 Presence of cardiac pacemaker: Secondary | ICD-10-CM

## 2019-08-17 DIAGNOSIS — Z89512 Acquired absence of left leg below knee: Secondary | ICD-10-CM

## 2019-08-17 DIAGNOSIS — I480 Paroxysmal atrial fibrillation: Secondary | ICD-10-CM

## 2019-08-17 DIAGNOSIS — N186 End stage renal disease: Secondary | ICD-10-CM

## 2019-08-17 DIAGNOSIS — I442 Atrioventricular block, complete: Secondary | ICD-10-CM

## 2019-08-17 DIAGNOSIS — Z992 Dependence on renal dialysis: Secondary | ICD-10-CM

## 2019-08-17 DIAGNOSIS — I9589 Other hypotension: Secondary | ICD-10-CM

## 2019-08-17 LAB — RENAL FUNCTION PANEL
Albumin: 3 g/dL — ABNORMAL LOW (ref 3.5–5.0)
Anion gap: 18 — ABNORMAL HIGH (ref 5–15)
BUN: 33 mg/dL — ABNORMAL HIGH (ref 8–23)
CO2: 22 mmol/L (ref 22–32)
Calcium: 9.2 mg/dL (ref 8.9–10.3)
Chloride: 92 mmol/L — ABNORMAL LOW (ref 98–111)
Creatinine, Ser: 4.48 mg/dL — ABNORMAL HIGH (ref 0.61–1.24)
GFR calc Af Amer: 13 mL/min — ABNORMAL LOW (ref >60)
GFR calc non Af Amer: 12 mL/min — ABNORMAL LOW (ref >60)
Glucose, Bld: 143 mg/dL — ABNORMAL HIGH (ref 70–99)
Phosphorus: 6 mg/dL — ABNORMAL HIGH (ref 2.5–4.6)
Potassium: 4.6 mmol/L (ref 3.5–5.1)
Sodium: 132 mmol/L — ABNORMAL LOW (ref 135–145)

## 2019-08-17 LAB — CBC
HCT: 36.4 % — ABNORMAL LOW (ref 39.0–52.0)
Hemoglobin: 11.5 g/dL — ABNORMAL LOW (ref 13.0–17.0)
MCH: 29.8 pg (ref 26.0–34.0)
MCHC: 31.6 g/dL (ref 30.0–36.0)
MCV: 94.3 fL (ref 80.0–100.0)
Platelets: 217 10*3/uL (ref 150–400)
RBC: 3.86 MIL/uL — ABNORMAL LOW (ref 4.22–5.81)
RDW: 16.5 % — ABNORMAL HIGH (ref 11.5–15.5)
WBC: 10.1 10*3/uL (ref 4.0–10.5)
nRBC: 0 % (ref 0.0–0.2)

## 2019-08-17 LAB — GLUCOSE, CAPILLARY
Glucose-Capillary: 138 mg/dL — ABNORMAL HIGH (ref 70–99)
Glucose-Capillary: 101 mg/dL — ABNORMAL HIGH (ref 70–99)
Glucose-Capillary: 190 mg/dL — ABNORMAL HIGH (ref 70–99)
Glucose-Capillary: 197 mg/dL — ABNORMAL HIGH (ref 70–99)

## 2019-08-17 MED ORDER — PANTOPRAZOLE SODIUM 40 MG PO TBEC
40.0000 mg | DELAYED_RELEASE_TABLET | Freq: Every day | ORAL | Status: DC
  Administered 2019-08-17 – 2019-08-20 (×4): 40 mg via ORAL
  Filled 2019-08-17 (×4): qty 1

## 2019-08-17 MED ORDER — ATORVASTATIN CALCIUM 80 MG PO TABS
80.0000 mg | ORAL_TABLET | Freq: Every day | ORAL | Status: DC
  Administered 2019-08-17 – 2019-08-19 (×3): 80 mg via ORAL
  Filled 2019-08-17 (×2): qty 2
  Filled 2019-08-17: qty 1

## 2019-08-17 MED ORDER — DOXERCALCIFEROL 4 MCG/2ML IV SOLN
1.0000 ug | INTRAVENOUS | Status: DC
  Filled 2019-08-17 (×2): qty 2

## 2019-08-17 MED ORDER — SEVELAMER CARBONATE 800 MG PO TABS
1600.0000 mg | ORAL_TABLET | Freq: Three times a day (TID) | ORAL | Status: DC
  Administered 2019-08-17 – 2019-08-20 (×8): 1600 mg via ORAL
  Filled 2019-08-17 (×11): qty 2

## 2019-08-17 MED ORDER — ACETAMINOPHEN 325 MG PO TABS
650.0000 mg | ORAL_TABLET | Freq: Four times a day (QID) | ORAL | Status: DC | PRN
  Administered 2019-08-17 – 2019-08-20 (×2): 650 mg via ORAL
  Filled 2019-08-17: qty 2

## 2019-08-17 MED ORDER — CHLORHEXIDINE GLUCONATE CLOTH 2 % EX PADS: 6.0000 | MEDICATED_PAD | Freq: Every day | CUTANEOUS | Status: DC

## 2019-08-17 MED ORDER — MIDODRINE HCL 5 MG PO TABS
ORAL_TABLET | ORAL | Status: AC
  Administered 2019-08-17: 10 mg via ORAL
  Filled 2019-08-17: qty 2

## 2019-08-17 NOTE — Discharge Instructions (Signed)
Information on my medicine - ELIQUIS (apixaban)  This medication education was reviewed with me or my healthcare representative as part of my discharge preparation.  The pharmacist that spoke with me during my hospital stay was:  Robertson Colclough Z  Wren Pryce,, RPH  Why was Eliquis prescribed for you? Eliquis was prescribed for you to reduce the risk of forming blood clots that can cause a stroke if you have a medical condition called atrial fibrillation (a type of irregular heartbeat) OR to reduce the risk of a blood clots forming after orthopedic surgery.  What do You need to know about Eliquis ? Take your Eliquis TWICE DAILY - one tablet in the morning and one tablet in the evening with or without food.  It would be best to take the doses about the same time each day.  If you have difficulty swallowing the tablet whole please discuss with your pharmacist how to take the medication safely.  Take Eliquis exactly as prescribed by your doctor and DO NOT stop taking Eliquis without talking to the doctor who prescribed the medication.  Stopping may increase your risk of developing a new clot or stroke.  Refill your prescription before you run out.  After discharge, you should have regular check-up appointments with your healthcare provider that is prescribing your Eliquis.  In the future your dose may need to be changed if your kidney function or weight changes by a significant amount or as you get older.  What do you do if you miss a dose? If you miss a dose, take it as soon as you remember on the same day and resume taking twice daily.  Do not take more than one dose of ELIQUIS at the same time.  Important Safety Information A possible side effect of Eliquis is bleeding. You should call your healthcare provider right away if you experience any of the following: ? Bleeding from an injury or your nose that does not stop. ? Unusual colored urine (red or dark brown) or unusual colored stools (red or  black). ? Unusual bruising for unknown reasons. ? A serious fall or if you hit your head (even if there is no bleeding).  Some medicines may interact with Eliquis and might increase your risk of bleeding or clotting while on Eliquis. To help avoid this, consult your healthcare provider or pharmacist prior to using any new prescription or non-prescription medications, including herbals, vitamins, non-steroidal anti-inflammatory drugs (NSAIDs) and supplements.  This website has more information on Eliquis (apixaban): [email or web address].

## 2019-08-17 NOTE — Progress Notes (Signed)
Patient remained in chair all night. He stated he does not wish to get in the bed and that at home he sleeps in his recliner. Educated patient on importance of shifting his weight while in the chair as well as elevating his legs. Patient did not want legs elevated.  Patient refused bath tonight. He wishes for his bath to be done on day shift when his wife is here to help.   VS stable throughout night. BP soft when patient deep asleep.

## 2019-08-17 NOTE — Progress Notes (Signed)
PROGRESS NOTE    Jeffrey MASCIARELLI  [id number] DOB: [birth year] DOA: 08/14/2019 PCP: Scot Jun, FNP   Brief Narrative:  78 yo WM PMHx CVA, chronic respiratory failure/COPD on 2 L O2 via [place] at home, complete heart block s/p pacemaker, chronic systolic and diastolic CHF (EF [id number]), s/p TAVR, CAD, HTN, PVD, HLD, ESRD on HD M/W/F, s/p LEFT BKA, Diabetes type 2 uncontrolled with complication  with intermittent SOB and chest pain, in ED on NRB. fistula recently clotted but per patient is patient and intended for use Saturday.  PCCM consulted for admission, ? iHD cath placement if CRRT needed this evening.     Subjective: A/O x4, negative CP, negative abdominal pain, negative N/V.  Patient states he passed out wife found him in the bathroom called EMS does not remember anything until he was in the ambulance.  States his regular HD days are on T/TH/SAT, believes his base weight needs to be adjusted   Assessment & Plan:   Active Problems:   Acute respiratory failure with hypoxia (HCC)   End-stage renal disease on hemodialysis (HCC)   Chronic hypotension   Acute systolic CHF (congestive heart failure) (HCC)   AF (paroxysmal atrial fibrillation) (HCC)   Complete heart block (HCC)   Presence of permanent cardiac pacemaker   S/P BKA (below knee amputation), left (HCC)   Acute on chronic hypoxic respiratory failure - in the setting of volume overload in ESRD; CT chest negative for PE - stable on 5L HFNC; wean to home 3L as able to maintain sats >92%  ESRD on HD T/TH/SAT? - Nephrology plans on performing HD in the a.m. - Continue HD per nephrology  Hyperkalemia -dialyzes 4x per week due to hypotension and chronic volume overload due to early sign off and poor fluid restrictionat home - K stable after HD yesterday   Hypotension - chronic - mentation normal - continue home midodrine 10 mg on HD days  Acute systolic CHF - echo [id number] with decline in LVEF since prior study  (25-30%) - follows with Dr. Caryl Comes - continue home meds   Paroxysmal atrial fibrillation -See CHF -Eliquis  Complete heart block - S/p pacer  CAD  Left BKA -Stable    DVT prophylaxis: Eliquis Code Status: Full Family Communication: None Disposition Plan: TBD   Consultants:  Nephrology PCCM    Procedures/Significant Events:  9/19 echocardiogram;Left Ventricle: Left ventricular ejection fraction, by visual estimation, is 25 to 30%. The left ventricle has severely decreased function. There is no left ventricular hypertrophy. Moderately increased left ventricular size. Spectral Doppler shows  Indeterminate diastolic filling due to E-A fusion pattern of LV diastolic filling.    LV Wall Scoring: The basal and mid inferolateral wall and basal inferior segment are akinetic. The entire apex, entire anterior septum, mid and apical inferior wall, and basal inferoseptal segment are hypokinetic. All remaining scored segments are normal.  Right Ventricle: The right ventricular size is moderately enlarged. No increase in right ventricular wall thickness. Global RV systolic function is has moderately reduced systolic function. The tricuspid regurgitant velocity is 3.15 m/s, and with an  assumed right atrial pressure of 15 mmHg, the estimated right ventricular systolic pressure is moderately elevated at 54.7 mmHg.  Left Atrium: Left atrial size was moderately dilated.  Right Atrium: Right atrial size was moderately dilated  Pericardium: There is no evidence of pericardial effusion.  Mitral Valve: The mitral valve is degenerative in appearance. There is mild thickening of the mitral valve leaflet(s). Moderate mitral  annular calcification. Moderate mitral valve regurgitation.  Tricuspid Valve: The tricuspid valve is grossly normal. Tricuspid valve regurgitation moderate by color flow Doppler.  Aortic Valve: The aortic valve has been repaired/replaced. Aortic valve  regurgitation is trivial by color flow Doppler. Aortic valve mean gradient measures 8.0 mmHg. Aortic valve peak gradient measures 16.8 mmHg. Aortic valve area, by VTI measures 1.47  cm. 58mm Edwards Sapien bioprosthetic, stented aortic valve (TAVR) valve is present in the aortic position. 29 mm Edwards Sapien 3 (TAVR) in aortic position.  Pulmonic Valve: The pulmonic valve was not well visualized. Pulmonic valve regurgitation is not visualized by color flow Doppler.  Aorta: The aortic root is normal in size and structure.  Venous: The inferior vena cava is dilated in size with less than 50% respiratory variability, suggesting right atrial pressure of 15 mmHg.  IAS/Shunts: No atrial level shunt detected by color flow Doppler.   Additional Comments: A pacer wire is visualized.   I have personally reviewed and interpreted all radiology studies and my findings are as above.  VENTILATOR SETTINGS:    Cultures   Antimicrobials:    Devices    LINES / TUBES:      Continuous Infusions:   Objective: Vitals:   08/17/19 1730 08/17/19 1800 08/17/19 1900 08/17/19 1943  BP:  (!) 84/40 (!) 88/39   Pulse: (!) 56 61 (!) 58   Resp: 18 (!) 21 18   Temp:    98.4 F (36.9 C)  TempSrc:    Oral  SpO2: 99% (!) 88% 99%   Weight:      Height:        Intake/Output Summary (Last 24 hours) at 08/17/2019 1947 Last data filed at 08/17/2019 1700 Gross per 24 hour  Intake 750 ml  Output 2556 ml  Net -1806 ml   Filed Weights   08/16/19 0500 08/17/19 0630 08/17/19 1137  Weight: 98.3 kg 99.3 kg 96.3 kg    Examination:  General: A/O x4, positive acute on chronic respiratory distress Eyes: negative scleral hemorrhage, negative anisocoria, negative icterus ENT: Negative Runny nose, negative gingival bleeding, Neck:  Negative scars, masses, torticollis, lymphadenopathy, JVD Lungs: Clear to auscultation bilaterally without wheezes or crackles Cardiovascular: Regular rate and rhythm  without murmur gallop or rub normal S1 and S2 Abdomen: Obese, negative abdominal pain, nondistended, positive soft, bowel sounds, no rebound, no ascites, no appreciable mass Extremities: LEFT BKA stable, RLE wrapped in gauze, +1 DP plus PT pulse.   Skin: Negative rashes, lesions, ulcers Psychiatric:  Negative depression, negative anxiety, negative fatigue, negative mania  Central nervous system:  Cranial nerves II through XII intact, tongue/uvula midline, all extremities muscle strength 5/5, sensation intact throughout, negative dysarthria, negative expressive aphasia, negative receptive aphasia.  .     Data Reviewed: Care during the described time interval was provided by me .  I have reviewed this patient's available data, including medical history, events of note, physical examination, and all test results as part of my evaluation.   CBC: Recent Labs  Lab 08/14/19 2016 08/14/19 2046 08/15/19 0425 08/16/19 0417 08/17/19 0727  WBC 6.9  --  7.1 8.2 10.1  NEUTROABS 4.7  --   --   --   --   HGB 11.9* 12.9* 11.4* 11.4* 11.5*  HCT 37.9* 38.0* 35.1* 34.7* 36.4*  MCV 98.4  --  95.1 94.0 94.3  PLT 172  --  185 187 [id number]   Basic Metabolic Panel: Recent Labs  Lab 08/14/19 2016 08/14/19 2046 08/15/19 0144  08/15/19 0425 08/16/19 0417 08/17/19 0727  NA 134* 133* 135 136 133* 132*  K 6.1* 5.8* 5.8* 6.2* 4.6 4.6  CL 97*  --  95* 96* 94* 92*  CO2 19*  --  23 24 23 22   GLUCOSE 219*  --  146* 145* 156* 143*  BUN 35*  --  38* 39* 22 33*  CREATININE 5.19*  --  5.27* 5.34* 3.60* 4.48*  CALCIUM 8.7*  --  9.0 9.2 9.0 9.2  MG  --   --  1.8  --  1.8  --   PHOS  --   --  6.8*  --  5.4* 6.0*   GFR: Estimated Creatinine Clearance: 15.6 mL/min (A) (by C-G formula based on SCr of 4.48 mg/dL (H)). Liver Function Tests: Recent Labs  Lab 08/14/19 2016 08/15/19 0144 08/16/19 0417 08/17/19 0727  AST 33 36  --   --   ALT 20 22  --   --   ALKPHOS 135* 138*  --   --   BILITOT 2.0* 2.1*  --   --    PROT 6.4* 6.4*  --   --   ALBUMIN 3.0* 3.2* 3.0* 3.0*   No results for input(s): LIPASE, AMYLASE in the last 168 hours. No results for input(s): AMMONIA in the last 168 hours. Coagulation Profile: No results for input(s): INR, PROTIME in the last 168 hours. Cardiac Enzymes: No results for input(s): CKTOTAL, CKMB, CKMBINDEX, TROPONINI in the last 168 hours. BNP (last 3 results) No results for input(s): PROBNP in the last 8760 hours. HbA1C: Recent Labs    08/15/19 0144  HGBA1C 9.3*   CBG: Recent Labs  Lab 08/16/19 1718 08/16/19 2211 08/17/19 0639 08/17/19 1203 08/17/19 1622  GLUCAP 227* 230* 138* 101* 190*   Lipid Profile: No results for input(s): CHOL, HDL, LDLCALC, TRIG, CHOLHDL, LDLDIRECT in the last 72 hours. Thyroid Function Tests: No results for input(s): TSH, T4TOTAL, FREET4, T3FREE, THYROIDAB in the last 72 hours. Anemia Panel: No results for input(s): VITAMINB12, FOLATE, FERRITIN, TIBC, IRON, RETICCTPCT in the last 72 hours. Urine analysis:    Component Value Date/Time   COLORURINE YELLOW 06/17/2017 [place] 06/17/2017 0547   LABSPEC 1.010 06/17/2017 0547   PHURINE 6.0 06/17/2017 0547   GLUCOSEU >=500 (A) 06/17/2017 0547   HGBUR SMALL (A) 06/17/2017 0547   BILIRUBINUR NEGATIVE 06/17/2017 0547   KETONESUR NEGATIVE 06/17/2017 0547   PROTEINUR 100 (A) 06/17/2017 0547   UROBILINOGEN 0.2 09/28/2014 2257   NITRITE NEGATIVE 06/17/2017 0547   LEUKOCYTESUR TRACE (A) 06/17/2017 0547   Sepsis Labs: @LABRCNTIP (procalcitonin:4,lacticidven:4)  ) Recent Results (from the past 240 hour(s))  Blood culture (routine x 2)     Status: None (Preliminary result)   Collection Time: 08/14/19  8:04 PM   Specimen: BLOOD RIGHT HAND  Result Value Ref Range Status   Specimen Description BLOOD RIGHT HAND  Final   Special Requests   Final    BOTTLES DRAWN AEROBIC AND ANAEROBIC Blood Culture results may not be optimal due to an inadequate volume of blood received  in culture bottles   Culture   Final    NO GROWTH 3 DAYS Performed at [facility], [place] [address], [place], [place] [address]    Report Status PENDING  Incomplete  SARS Coronavirus 2 [facility] order, Performed in [facility] hospital lab) Nasopharyngeal Nasopharyngeal Swab     Status: None   Collection Time: 08/14/19  8:16 PM   Specimen: Nasopharyngeal Swab  Result Value Ref Range Status   SARS Coronavirus 2 NEGATIVE NEGATIVE Final    Comment: (NOTE) If result is NEGATIVE SARS-CoV-2 target nucleic acids are NOT DETECTED. The SARS-CoV-2 RNA is generally detectable in upper and lower  respiratory specimens during the acute phase of infection. The lowest  concentration of SARS-CoV-2 viral copies this assay can detect is 250  copies / mL. A negative result does not preclude SARS-CoV-2 infection  and should not be used as the sole basis for treatment or other  patient management decisions.  A negative result may occur with  improper specimen collection / handling, submission of specimen other  than nasopharyngeal swab, presence of viral mutation(s) within the  areas targeted by this assay, and inadequate number of viral copies  (<250 copies / mL). A negative result must be combined with clinical  observations, patient history, and epidemiological information. If result is POSITIVE SARS-CoV-2 target nucleic acids are DETECTED. The SARS-CoV-2 RNA is generally detectable in upper and lower  respiratory specimens dur ing the acute phase of infection.  Positive  results are indicative of active infection with SARS-CoV-2.  Clinical  correlation with patient history and other diagnostic information is  necessary to determine patient infection status.  Positive results do  not rule out bacterial infection or co-infection with other viruses. If result is PRESUMPTIVE POSTIVE SARS-CoV-2 nucleic acids MAY BE PRESENT.   A presumptive positive result was obtained on the submitted specimen    and confirmed on repeat testing.  While 2019 novel coronavirus  (SARS-CoV-2) nucleic acids may be present in the submitted sample  additional confirmatory testing may be necessary for epidemiological  and / or clinical management purposes  to differentiate between  SARS-CoV-2 and other Sarbecovirus currently known to infect humans.  If clinically indicated additional testing with an alternate test  methodology [phone]) is advised. The SARS-CoV-2 RNA is generally  detectable in upper and lower respiratory sp ecimens during the acute  phase of infection. The expected result is Negative. Fact Sheet for Patients:  StrictlyIdeas.no Fact Sheet for Healthcare Providers: BankingDealers.co.za This test is not yet approved or cleared by the [place] FDA and has been authorized for detection and/or diagnosis of SARS-CoV-2 by FDA under an Emergency Use Authorization (EUA).  This EUA will remain in effect (meaning this test can be used) for the duration of the COVID-19 declaration under Section 564(b)(1) of the Act, 21 U.S.C. section 360bbb-3(b)(1), unless the authorization is terminated or revoked sooner. Performed at [facility], [place] [address], [place], [place] [address]   Blood culture (routine x 2)     Status: None (Preliminary result)   Collection Time: 08/15/19  4:25 AM   Specimen: BLOOD  Result Value Ref Range Status   Specimen Description BLOOD RIGHT ANTECUBITAL  Final   Special Requests   Final    BOTTLES DRAWN AEROBIC AND ANAEROBIC Blood Culture adequate volume   Culture   Final    NO GROWTH 2 DAYS Performed at [facility], [place] [address] [place], [place] [address]    Report Status PENDING  Incomplete  MRSA PCR Screening     Status: None   Collection Time: 08/15/19  5:00 AM   Specimen: Nasopharyngeal  Result Value Ref Range Status   MRSA by PCR NEGATIVE NEGATIVE Final    Comment:        The GeneXpert MRSA  Assay (FDA approved for NASAL specimens only), is one component of a comprehensive MRSA colonization surveillance  program. It is not intended to diagnose MRSA infection nor to guide or monitor treatment for MRSA infections. Performed at [facility], [place] [address], [place], [place] [address]          Radiology Studies: No results found.      Scheduled Meds:  apixaban  2.5 mg Oral BID   atorvastatin  80 mg Oral q1800   Chlorhexidine Gluconate Cloth  6 each Topical Q0600   Chlorhexidine Gluconate Cloth  6 each Topical Q0600   clopidogrel  75 mg Oral Daily   doxercalciferol  1 mcg Intravenous Q M,W,F-HD   fluticasone furoate-vilanterol  1 puff Inhalation Daily   insulin aspart  0-5 Units Subcutaneous QHS   insulin aspart  0-9 Units Subcutaneous TID WC   mouth rinse  15 mL Mouth Rinse BID   midodrine  10 mg Oral Q Mon   And   midodrine  10 mg Oral Q Mon-HD   midodrine  10 mg Oral Q M,W,F   And   [START ON 08/18/2019] midodrine  10 mg Oral Q T,Th,Sa-HD   midodrine  2.5 mg Oral TID WC   pantoprazole  40 mg Oral Daily   pneumococcal 23 valent vaccine  0.5 mL Intramuscular Tomorrow-1000   sevelamer carbonate  1,600 mg Oral TID WC   Continuous Infusions:   LOS: 2 days   The patient is critically ill with multiple organ systems failure and requires high complexity decision making for assessment and support, frequent evaluation and titration of therapies, application of advanced monitoring technologies and extensive interpretation of multiple databases. Critical Care Time devoted to patient care services described in this note  Time spent: 40 minutes     Jelena Malicoat, Geraldo Docker, MD [facility] Pager [phone]  If 7PM-7AM, please contact night-coverage [email or web address] Password [facility] 08/17/2019, 7:47 PM

## 2019-08-17 NOTE — Progress Notes (Signed)
Pt put back in bed via lift. Patient transported to dialysis without incident. 10mg  midodrine dose given pre dialysis per order. CBG level normal no coverage given pre dialysis.

## 2019-08-17 NOTE — Progress Notes (Signed)
[facility] NEPHROLOGY PROGRESS NOTE  Assessment/ Plan: Pt is a 78 y.o. yo male ESRD on HD, AS s/p TAVR, left BKA, chronic hypertension admitted with hypoxia and chest pain.  Dialysis: [facility]   95.5kg   F200  2/2.25 bath  Hep none  LUE AVF    Hectorol 1 mg q rx   Venofer 50 mg q week   No ESA  #SOB/hypoxia due to volume overload.  This is a recurrent problem complicated by hypotension.  Plan for midodrine before and during dialysis.  Goal UF around 2.5 L.  # ESRD: Plan for next dialysis tomorrow.  Monitor lab.  # Anemia: Hemoglobin acceptable.  No ESA.  # Secondary hyperparathyroidism: resume Hectorol and binders.  #Hypotension/volume: Continue midodrine.  Has chronic low blood pressure.  #Dialysis Access: Recent fistulogram.  #PAD status post left BKA   Subjective: Seen and examined in dialysis unit.  Has low blood pressure.  Denies headache, dizziness, chest pain or shortness of breath.  Tolerating well so far. Objective Vital signs in last 24 hours: Vitals:   08/17/19 0708 08/17/19 0718 08/17/19 0730 08/17/19 0830  BP: (!) 81/46 (!) 86/45 (!) 79/42 (!) 81/44  Pulse: (!) 55 (!) 55 (!) 55 (!) 55  Resp: 12     Temp: 97.7 F (36.5 C)     TempSrc: Oral     SpO2: 100%     Weight:      Height:       Weight change: 3.3 kg  Intake/Output Summary (Last 24 hours) at 08/17/2019 0851 Last data filed at 08/16/2019 2200 Gross per 24 hour  Intake 630 ml  Output -  Net 630 ml       Labs: Basic Metabolic Panel: Recent Labs  Lab 08/15/19 0144 08/15/19 0425 08/16/19 0417 08/17/19 0727  NA 135 136 133* 132*  K 5.8* 6.2* 4.6 4.6  CL 95* 96* 94* 92*  CO2 23 24 23 22   GLUCOSE 146* 145* 156* 143*  BUN 38* 39* 22 33*  CREATININE 5.27* 5.34* 3.60* 4.48*  CALCIUM 9.0 9.2 9.0 9.2  PHOS 6.8*  --  5.4* 6.0*   Liver Function Tests: Recent Labs  Lab 08/14/19 2016 08/15/19 0144 08/16/19 0417 08/17/19 0727  AST 33 36  --   --   ALT 20 22  --   --    ALKPHOS 135* 138*  --   --   BILITOT 2.0* 2.1*  --   --   PROT 6.4* 6.4*  --   --   ALBUMIN 3.0* 3.2* 3.0* 3.0*   No results for input(s): LIPASE, AMYLASE in the last 168 hours. No results for input(s): AMMONIA in the last 168 hours. CBC: Recent Labs  Lab 08/14/19 2016  08/15/19 0425 08/16/19 0417 08/17/19 0727  WBC 6.9  --  7.1 8.2 10.1  NEUTROABS 4.7  --   --   --   --   HGB 11.9*   < > 11.4* 11.4* 11.5*  HCT 37.9*   < > 35.1* 34.7* 36.4*  MCV 98.4  --  95.1 94.0 94.3  PLT 172  --  185 187 217   < > = values in this interval not displayed.   Cardiac Enzymes: No results for input(s): CKTOTAL, CKMB, CKMBINDEX, TROPONINI in the last 168 hours. CBG: Recent Labs  Lab 08/16/19 0757 08/16/19 1130 08/16/19 1718 08/16/19 2211 08/17/19 0639  GLUCAP 132* 148* 227* 230* 138*    Iron Studies: No results for input(s): IRON,  TIBC, TRANSFERRIN, FERRITIN in the last 72 hours. Studies/Results: No results found.  Medications: Infusions:   Scheduled Medications: . midodrine      . apixaban  2.5 mg Oral BID  . Chlorhexidine Gluconate Cloth  6 each Topical Q0600  . clopidogrel  75 mg Oral Daily  . fluticasone furoate-vilanterol  1 puff Inhalation Daily  . influenza vaccine adjuvanted  0.5 mL Intramuscular Tomorrow-1000  . insulin aspart  0-5 Units Subcutaneous QHS  . insulin aspart  0-9 Units Subcutaneous TID WC  . mouth rinse  15 mL Mouth Rinse BID  . midodrine  10 mg Oral Q Mon   And  . midodrine  10 mg Oral Q Mon-HD  . midodrine  10 mg Oral Q M,W,F   And  . [START ON 08/18/2019] midodrine  10 mg Oral Q T,Th,Sa-HD  . midodrine  2.5 mg Oral TID WC  . pneumococcal 23 valent vaccine  0.5 mL Intramuscular Tomorrow-1000    have reviewed scheduled and prn medications.  Physical Exam: General:NAD, comfortable Heart: Irregular heart rate, s1s2 nl Lungs: Bibasal coarse breath sound, no wheezing Abdomen:soft, Non-tender, non-distended Extremities: Left BKA, has dependent  edema Dialysis Access: Left upper extremity AV fistula Skin no rash or ulcer.  Dron Prasad Bhandari 08/17/2019,8:51 AM  LOS: 2 days  Pager: ID:[phone]

## 2019-08-18 DIAGNOSIS — R55 Syncope and collapse: Secondary | ICD-10-CM | POA: Diagnosis present

## 2019-08-18 DIAGNOSIS — Z952 Presence of prosthetic heart valve: Secondary | ICD-10-CM

## 2019-08-18 DIAGNOSIS — E118 Type 2 diabetes mellitus with unspecified complications: Secondary | ICD-10-CM

## 2019-08-18 DIAGNOSIS — E1165 Type 2 diabetes mellitus with hyperglycemia: Secondary | ICD-10-CM | POA: Diagnosis present

## 2019-08-18 DIAGNOSIS — IMO0002 Reserved for concepts with insufficient information to code with codable children: Secondary | ICD-10-CM | POA: Diagnosis present

## 2019-08-18 DIAGNOSIS — E041 Nontoxic single thyroid nodule: Secondary | ICD-10-CM | POA: Diagnosis present

## 2019-08-18 LAB — CBC
HCT: 34.3 % — ABNORMAL LOW (ref 39.0–52.0)
Hemoglobin: 11.3 g/dL — ABNORMAL LOW (ref 13.0–17.0)
MCH: 30.8 pg (ref 26.0–34.0)
MCHC: 32.9 g/dL (ref 30.0–36.0)
MCV: 93.5 fL (ref 80.0–100.0)
Platelets: 169 10*3/uL (ref 150–400)
RBC: 3.67 MIL/uL — ABNORMAL LOW (ref 4.22–5.81)
RDW: 16.7 % — ABNORMAL HIGH (ref 11.5–15.5)
WBC: 8.5 10*3/uL (ref 4.0–10.5)
nRBC: 0 % (ref 0.0–0.2)

## 2019-08-18 LAB — LIPID PANEL
Cholesterol: 60 mg/dL (ref 0–200)
HDL: 17 mg/dL — ABNORMAL LOW (ref >40)
LDL Cholesterol: 26 mg/dL (ref 0–99)
Total CHOL/HDL Ratio: 3.5 RATIO
Triglycerides: 84 mg/dL (ref <150)
VLDL: 17 mg/dL (ref 0–40)

## 2019-08-18 LAB — GLUCOSE, CAPILLARY
Glucose-Capillary: 144 mg/dL — ABNORMAL HIGH (ref 70–99)
Glucose-Capillary: 205 mg/dL — ABNORMAL HIGH (ref 70–99)
Glucose-Capillary: 241 mg/dL — ABNORMAL HIGH (ref 70–99)

## 2019-08-18 LAB — BASIC METABOLIC PANEL
Anion gap: 11 (ref 5–15)
BUN: 20 mg/dL (ref 8–23)
CO2: 27 mmol/L (ref 22–32)
Calcium: 8.6 mg/dL — ABNORMAL LOW (ref 8.9–10.3)
Chloride: 94 mmol/L — ABNORMAL LOW (ref 98–111)
Creatinine, Ser: 3.47 mg/dL — ABNORMAL HIGH (ref 0.61–1.24)
GFR calc Af Amer: 18 mL/min — ABNORMAL LOW (ref >60)
GFR calc non Af Amer: 16 mL/min — ABNORMAL LOW (ref >60)
Glucose, Bld: 225 mg/dL — ABNORMAL HIGH (ref 70–99)
Potassium: 3.6 mmol/L (ref 3.5–5.1)
Sodium: 132 mmol/L — ABNORMAL LOW (ref 135–145)

## 2019-08-18 LAB — TSH: TSH: 5.976 u[IU]/mL — ABNORMAL HIGH (ref 0.350–4.500)

## 2019-08-18 LAB — MAGNESIUM: Magnesium: 1.8 mg/dL (ref 1.7–2.4)

## 2019-08-18 MED ORDER — ALBUMIN HUMAN 25 % IV SOLN
INTRAVENOUS | Status: AC
  Administered 2019-08-18: 25 g
  Filled 2019-08-18: qty 100

## 2019-08-18 MED ORDER — MIDODRINE HCL 5 MG PO TABS
10.0000 mg | ORAL_TABLET | Freq: Three times a day (TID) | ORAL | Status: DC
  Administered 2019-08-18 – 2019-08-20 (×6): 10 mg via ORAL
  Filled 2019-08-18 (×5): qty 2

## 2019-08-18 MED ORDER — MIDODRINE HCL 5 MG PO TABS: 10.0000 mg | ORAL_TABLET | Freq: Three times a day (TID) | ORAL | Status: DC

## 2019-08-18 MED ORDER — MIDODRINE HCL 5 MG PO TABS
ORAL_TABLET | ORAL | Status: AC
  Filled 2019-08-18: qty 2

## 2019-08-18 NOTE — TOC Initial Note (Signed)
Transition of Care (TOC) - Initial/Assessment Note    Patient Details  Name: Jeffrey [name] MRN: [id number] Date of Birth: 04/08/1941  Transition of Care (TOC) CM/SW Contact:    Carol L Watson, LCSW Phone Number: 08/18/2019, 3:06 PM  Clinical Narrative:                   Expected Discharge Plan: Home w Home Health Services Barriers to Discharge: Continued Medical Work up   Patient Goals and CMS Choice  CSW met with patient and wife Becky at bedside to complete assessment. Patient was adamant that he will not go to a SNF for rehab that he will go home and continue using the home health services he is receiving. Patient requested that he keep his current hoyer lift and oxygen supplies. Patient provided permission for CSW to contact his wife for further questions.  Contact was made with [employer] for DME needs. TOC staff will secure updated HH services prior to discharge.    Expected Discharge Plan and Services Expected Discharge Plan: Home w Home Health Services     Post Acute Care Choice: Home Health, Durable Medical Equipment Living arrangements for the past 2 months: Single Family Home                 DME Arranged: Oxygen DME Agency: [employer] Date DME Agency Contacted: 08/18/19 Time DME Agency Contacted: [phone] Representative spoke with at DME Agency: Zack            Prior Living Arrangements/Services Living arrangements for the past 2 months: Single Family Home Lives with:: Spouse Patient language and need for interpreter reviewed:: No Do you feel safe going back to the place where you live?: Yes      Need for Family Participation in Patient Care: Yes (Comment) Care giver support system in place?: Yes (comment) Current home services: DME, Home PT Criminal Activity/Legal Involvement Pertinent to Current Situation/Hospitalization: No - Comment as needed  Activities of Daily Living      Permission Sought/Granted   Permission granted to share information with :  Yes, Verbal Permission Granted           Permission granted to share info w Contact Information: Becky, [phone]  Emotional Assessment Appearance:: Developmentally appropriate Attitude/Demeanor/Rapport: Engaged, Gracious Affect (typically observed): Calm, Appropriate Orientation: : Oriented to Place, Oriented to  Time, Oriented to Situation, Oriented to Self Alcohol / Substance Use: Never Used Psych Involvement: No (comment)  Admission diagnosis:  Hyperkalemia [E87.5] SOB (shortness of breath) [R06.02] Hypoxia [R09.02] ESRD (end stage renal disease) (HCC) [N18.6] Patient Active Problem List   Diagnosis Date Noted  . Diabetes mellitus type 2, uncontrolled, with complications (HCC) 08/18/2019  . Syncope and collapse 08/18/2019  . H/O aortic valve replacement 08/18/2019  . Thyroid nodule 08/18/2019  . End-stage renal disease on hemodialysis (HCC) 08/17/2019  . Chronic hypotension 08/17/2019  . Acute systolic CHF (congestive heart failure) (HCC) 08/17/2019  . AF (paroxysmal atrial fibrillation) (HCC) 08/17/2019  . Complete heart block (HCC) 08/17/2019  . Presence of permanent cardiac pacemaker 08/17/2019  . S/P BKA (below knee amputation), left (HCC) 08/17/2019  . Acute respiratory failure with hypoxia (HCC) 08/15/2019  . Osteomyelitis (HCC) 04/10/2019  . Acute osteomyelitis of left foot (HCC) 04/10/2019  . Cellulitis in diabetic foot (HCC) 02/24/2019  . Chronic anticoagulation 02/24/2019  . Venous stasis 02/24/2019  . Hypotension 02/24/2019  . Discoloration of skin of foot 02/20/2019  . Discoloration of skin of multiple sites of lower extremity 02/20/2019  .   RSV (respiratory syncytial virus infection) 11/30/2018  . Troponin level elevated   . COPD exacerbation ([place]) 11/29/2018  . Chronic respiratory failure with hypoxia ([place]) 10/29/2018  . Respiratory distress 07/27/2018  . GERD (gastroesophageal reflux disease) 06/23/2018  . Cough 06/23/2018  . Chest pain 04/15/2018   . NSTEMI (non-ST elevated myocardial infarction) ([place]) 04/14/2018  . Rash 04/14/2018  . COPD GOLD 0 09/26/2017  . Atrial fibrillation ([place]) [I48.91] 05/21/2017  . Encounter for therapeutic drug monitoring 05/21/2017  . Obesity (BMI 30-39.9) 04/17/2017  . Hyponatremia 03/10/2017  . COPD with acute exacerbation ([place]) 03/10/2017  . Non-healing open wound of left groin 09/05/2015  . Automatic implantable cardioverter-defibrillator in situ 08/08/2015  . 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 Hypervolemia   . Chronic combined systolic and diastolic CHF (congestive heart failure) ([place]) 07/19/2015  . Fluid overload 07/18/2015  . Acute on chronic respiratory failure with hypoxia ([place]) 07/18/2015  . 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/2015  . Atherosclerosis of native coronary artery of native heart with angina pectoris ([place])   . Protein-calorie malnutrition, severe ([place]) 01/14/2015  . ESRD (end stage renal disease) on dialysis ([place]) 01/12/2015  . Mobitz type 1 second degree AV block   . Elevated troponin 09/30/2014  . Diabetes type 2, controlled ([place])   . History of stroke 09/29/2014  . Type 2 diabetes mellitus with hypoglycemia without coma ([place]) 09/29/2014  . CAD (coronary artery disease) 09/29/2014  . Essential hypertension   . History of tobacco use 11/21/2012  . Hyperlipidemia 01/19/2010  . Arthritis, degenerative 01/19/2010  . Allergic rhinitis 12/05/2009  . Idiopathic peripheral neuropathy 01/05/2009  . 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  . Anemia of chronic disease 11/13/2007   PCP:  Scot Jun, FNP Pharmacy:   [id number] [place] [place]Phone: [phone]: [phone] [facility] [place] [address]Phone: [phone]:  [phone]    Social Determinants of Health (SDOH) Interventions    Readmission Risk Interventions No flowsheet data found.

## 2019-08-18 NOTE — Consult Note (Addendum)
Cardiology Consultation:   Patient ID: Jeffrey [name] MRN: [id number]; DOB: 1941-03-07  Admit date: 08/14/2019 Date of Consult: 08/18/2019  Primary Care Provider: Scot Jun, FNP Primary Cardiologist: [name], MD  Primary Electrophysiologist:  None    Patient Profile:   Jeffrey [name] is a 78 y.o. male with a history of multivessel CAD s/p PTCA/orbital atherectomy/DES of mid LAD and ostium circumflex and chronically occluded RCA on cath in 03/2018 with in-stent restenosis of ostium circumflex in 07/2018 requiring balloon/PTCA, severe aortic stenosis s/p TAVR in 2016, PAD s/p left below knee amputation in 03/2019 for nonhealing left foot wound, chronic combined CHF, persistent atrial fibrillation on Eliquis, complete heart blocker s/p PPM, ESRD on MWF, prior CVA, hypertension, hyperlipidemia, type 2 diabetes mellitus, who is being seen today for the evaluation of reduced EF at the request of Dr. Sherral Hammers.  History of Present Illness:   [name] is a 78 year old male with the above history who is followed by Dr. Caryl Comes. Patient has known CAD with chronically occluded RCA going back to 2015. Patient admitted for NSTEMI in 03/2018 and found to have severe multivessel CAD with chronically occluded RCA, 95% stenosis of ostial CX, 60-70% of proximal LAD, 70% stenosis of ostial 2nd Diagonal, and 30% stenosis of proximal to mid CX. He underwent successful PTCA/orbital atherectomy/DES of both mis LAD and ostium circumflex. Echo at that time showed LVEF of 45-50% with akinesis of the apicalanterior, lateral, inferior, and apical myocardium   Patient was discharge on dual antiplatelet therapy with Aspirin and Plavix. Patient had in-stent restenosis of ostium circumflex stent in 07/2018 and underwent successful balloon/PTCA.   Patient most recently seen by Dr. Caryl Comes for telehealth visit on 04/29/2019 at which time he was doing well from a cardiac standpoint. Device check on 04/30/2019 showed atrial  fibrillation burden of 100%.   Patient presented to the ED on 08/14/2019 via EMS for decreased level of consciousness.  When EMS arrived, patient hypotensive with BP of 80/70 and hypoxic with O2 sats in the 70-80%. Patient was placed on a non-rebreather and was given fluid bolus with some improvement. In the ED, EKG showed v-paced rhythm. Labs showed WBC 6.9, Hgb 11.9, Plts 172, Na 134, K 6.1, Glucose 219, BUN 35, Scr 5.19, AST 33, ALT 20, Alk Phos 135, Total Bili 2.0. BNP 1,710.9. High-sensitivity troponin 48 >> 49. Chest x-ray showed cardiomegaly with vascular congestion. Chest CTA showed no large centrally located PE.  Patient admitted for acute on chronic hypoxic respiratory failure in the setting of volume overload and further evaluation of altered mental/collapse/questionable syncope.  Echo this admission showed LVEF of 25 to 30%.  RV also noted to be moderately enlarged with moderately reduced systolic function.  See full report below.  Cardiology was consulted for further evaluation of reduced EF.  At the time of this evaluation, patient sitting comfortably upright in bed.  Mental status has improved.  Wife also at bedside.  Wife reports that on day of presentation patient was using the bedside commode.  Wife went to help lift the patient patient noted that he did not feel well and instructed wife to call 911 which is unusual for him because he does not like coming to the hospital.  Wife then states that patient fell backwards, his eyes rolled back in his head, and his tongue turned downward.  Wife questions whether he had a seizure but denies any seizure-like movements.  Wife also reports that patient was clammy and  unresponsive until fire department arrived.  Patient was noted to be very hypotensive upon their arrival as stated above.  Patient states that he felt lightheaded before this event and felt like he was being "smuthered."  He also reports some central chest pressure that radiated to his  back and gums before this episode.  He has had intermittent episodes of similar pain every few days for a few months now.  No clear palpitations but on pulse ox machine earlier in the day patient reported briefly had heart rates in the 200s.  Patient states he has felt very weak and often feels like he could pass out for the past couple of months.  Patient also notes shortness of breath but states it is been stable for the past 3 to 6 months.  He does report orthopnea and PND.  He has to sleep in a recliner.  Of note, patient has history of hypotension with dialysis.  He is on midodrine 2.5 mg 3 times daily and takes 10 mg a morning prior to dialysis and then reports reportedly received another 10 mg during dialysis.  Heart Pathway Score:     Past Medical History:  Diagnosis Date   Anginal pain ([place])    Aortic stenosis    a. severe by echo 09/2014   Atrial fibrillation ([place])    a. not well documented, not on anticoagulation   CHF (congestive heart failure) ([place])    04/28/17 echo-EF 40%, mod LVH, diastolic dysfunction   Claustrophobia    Complete heart block (H[facility])    COPD (chronic obstructive pulmonary disease) ([place])    Coronary artery disease    a. chronically occluded RCA per cath 09/2014 with collaterals B. cath 05/01/17 chr occ RCA w/collaterals, 60-70% mid LAD,    CVA (cerebral vascular accident) ([place]) 10/2014   denies residual on 07/11/2015   ESRD (end stage renal disease) on dialysis [facility])    a. 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   29 mm Edwards Sapien 3 transcatheter heart valve placed via open left  transfemoral approach   Type II diabetes mellitus ([place])     Past Surgical History:  Procedure Laterality Date   ABDOMINAL AORTOGRAM W/LOWER EXTREMITY Bilateral 03/27/2019   Procedure: ABDOMINAL AORTOGRAM W/LOWER EXTREMITY;  Surgeon: Angelia Mould, MD;  Location: [facility];  Service: Cardiovascular;  Laterality: Bilateral;   AMPUTATION Left 04/14/2019   Procedure: AMPUTATION BELOW KNEE;  Surgeon: Angelia Mould, MD;  Location: [facility];  Service: Vascular;  Laterality: Left;   AV FISTULA PLACEMENT Left 10/19/2014   Procedure: BRACHIOCEPHALIC ARTERIOVENOUS (AV) FISTULA CREATION ;  Surgeon: Conrad [place]own, MD;  Location: [facility];  Service: Vascular;  Laterality: Left;   CARDIAC CATHETERIZATION     CARDIAC CATHETERIZATION N/A 07/22/2015   Procedure: Right/Left Heart Cath and Coronary Angiography;  Surgeon: [name], MD;  Location: [facility];  Service: Cardiovascular;  Laterality: N/A;   CATARACT EXTRACTION W/ INTRAOCULAR LENS  IMPLANT, BILATERAL Bilateral 1990's   CHOLECYSTECTOMY OPEN  1980's   COLONOSCOPY W/ BIOPSIES AND POLYPECTOMY  CORONARY ANGIOGRAPHY N/A 07/31/2018   Procedure: CORONARY ANGIOGRAPHY;  Surgeon: Martinique, Peter M, MD;  Location: [facility];  Service: Cardiovascular;  Laterality: N/A;   CORONARY ANGIOPLASTY     CORONARY ATHERECTOMY N/A 04/18/2018   Procedure: CORONARY ATHERECTOMY;  Surgeon: [name], MD;  Location: [facility];  Service: Cardiovascular;  Laterality: N/A;   CORONARY BALLOON ANGIOPLASTY N/A 07/31/2018   Procedure: CORONARY BALLOON ANGIOPLASTY;  Surgeon: Martinique, Peter M, MD;  Location: [place] [facility];  Service: Cardiovascular;  Laterality: N/A;   CORONARY STENT INTERVENTION N/A 04/18/2018   Procedure: CORONARY STENT INTERVENTION;  Surgeon: [name], MD;  Location: [facility];  Service: Cardiovascular;  Laterality: N/A;   EP IMPLANTABLE DEVICE N/A 07/11/2015    Procedure: Pacemaker Implant;  Surgeon: [name], MD;  Location: [facility];  Service: Cardiovascular;  Laterality: N/A;   ESOPHAGOGASTRODUODENOSCOPY  08/01/2012   Procedure: ESOPHAGOGASTRODUODENOSCOPY (EGD);  Surgeon: [name], MD;  Location: [facility];  Service: Endoscopy;  Laterality: N/A;   INSERT / REPLACE / REMOVE PACEMAKER  07/11/2015   INSERTION OF DIALYSIS CATHETER Right 02/02/2015   Procedure: INSERTION OF DIALYSIS CATHETER  RIGHT INTERNAL JUGULAR;  Surgeon: Mal [name], MD;  Location: [facility];  Service: Vascular;  Laterality: Right;   LEFT AND RIGHT HEART CATHETERIZATION WITH CORONARY ANGIOGRAM N/A 09/30/2014   Procedure: LEFT AND RIGHT HEART CATHETERIZATION WITH CORONARY ANGIOGRAM;  Surgeon: [name], MD;  Location: [facility];  Service: Cardiovascular;  Laterality: N/A;   LEFT HEART CATH AND CORONARY ANGIOGRAPHY N/A 04/14/2018   Procedure: LEFT HEART CATH AND CORONARY ANGIOGRAPHY;  Surgeon: [name], MD;  Location: [facility];  Service: Cardiovascular;  Laterality: N/A;   TEE WITHOUT CARDIOVERSION N/A 08/02/2015   Procedure: TRANSESOPHAGEAL ECHOCARDIOGRAM (TEE);  Surgeon: [name], MD;  Location: [facility];  Service: Open Heart Surgery;  Laterality: N/A;   TONSILLECTOMY     TRANSCATHETER AORTIC VALVE REPLACEMENT, TRANSFEMORAL Left 08/02/2015   Procedure: TRANSCATHETER AORTIC VALVE REPLACEMENT, TRANSFEMORAL;  Surgeon: [name], MD;  Location: [facility];  Service: Open Heart Surgery;  Laterality: Left;     Home Medications:  Prior to Admission medications   Medication Sig Start Date End Date Taking? Authorizing Provider  acetaminophen (TYLENOL) 500 MG tablet Take 1 tablet (500 mg total) by mouth every 6 (six) hours as needed for mild pain or headache (pain). Patient taking differently: Take 1,000 mg by mouth every 6 (six) hours as needed for headache (pain).  04/18/19  Yes Pokhrel, Laxman, MD  albuterol (PROVENTIL HFA;VENTOLIN HFA) 108  (90 Base) MCG/ACT inhaler Inhale 2 puffs into the lungs every 4 (four) hours as needed for wheezing or shortness of breath. 11/27/18  Yes Tanda Rockers, MD  albuterol (PROVENTIL) (2.5 MG/3ML) 0.083% nebulizer solution Take 3 mLs (2.5 mg total) by nebulization every 6 (six) hours as needed for wheezing or shortness of breath. 01/07/19  Yes Scot Jun, FNP  apixaban (ELIQUIS) 2.5 MG TABS tablet Take 1 tablet (2.5 mg total) by mouth 2 (two) times daily. 04/29/19  Yes Deboraha Sprang, MD  atorvastatin (LIPITOR) 80 MG tablet Take 1 tablet (80 mg total) by mouth daily at 6 PM. 01/07/19  Yes Scot Jun, FNP  B Complex-C-Zn-Folic Acid (DIALYVITE 258 WITH ZINC) 0.8 MG TABS Take 1 tablet by mouth daily. 05/13/19  Yes [provider]  budesonide-formoterol (SYMBICORT) 160-4.5 MCG/ACT inhaler Inhale 2 puffs into the  lungs 2 (two) times daily as needed.   Yes [provider]  cinacalcet (SENSIPAR) 30 MG tablet Take 30 mg by mouth See admin instructions. Take one tablet (30 mg) by mouth on Monday, Tuesday, Thursday, Saturday after dialysis   Yes [provider]  clopidogrel (PLAVIX) 75 MG tablet Take 1 tablet (75 mg total) by mouth daily. 04/29/19  Yes Deboraha Sprang, MD  doxercalciferol (HECTOROL) 4 MCG/2ML injection Inject 0.5 mLs (1 mcg total) into the vein Every Tuesday,Thursday,and Saturday with dialysis. 02/26/19  Yes Norval Morton, MD  hydrOXYzine (ATARAX/VISTARIL) 25 MG tablet Take 1 tablet (25 mg total) by mouth every 8 (eight) hours as needed for anxiety. 04/22/19  Yes Scot Jun, FNP  Insulin Pen Needle 31G X 6 MM MISC at bedtime.  08/28/18  Yes [provider]  LEVEMIR FLEXTOUCH 100 UNIT/ML Pen INJECT 48 UNITS TOTAL INTO THE SKIN AT BEDTIME. Patient taking differently: Inject 18 Units into the skin at bedtime.  07/17/19  Yes Charlott Rakes, MD  lidocaine-prilocaine (EMLA) cream Apply 1 application topically as needed (dialysis days).   Yes [provider]  loperamide (IMODIUM) 2 MG capsule Take 2 mg by mouth as needed. 02/26/19 02/25/20 Yes [provider]  loratadine (CLARITIN) 10 MG tablet Take 10 mg by mouth daily.    Yes [provider]  Melatonin 5 MG TABS Take 5-10 mg by mouth at bedtime as needed (sleep).    Yes [provider]  metoprolol tartrate (LOPRESSOR) 25 MG tablet Take 0.5 tablets (12.5 mg total) by mouth See admin instructions. Take 1/2 tablet (12.5 mg) by mouth on Sunday, Monday, Wednesday, Friday mornings (non-dialysis days), take 1/2 tablet (12.5 mg) every evening Patient taking differently: Take 12.5 mg by mouth See admin instructions. Take 1/2 tablet (12.5 mg) by mouth on Sunday,, Wednesday, Friday mornings (non-dialysis days), take 1/2 tablet (12.5 mg) every evening 04/29/19  Yes Deboraha Sprang, MD  midodrine (PROAMATINE) 10 MG tablet Take 10 mg by mouth See admin instructions. Take one tablet (10 mg) by mouth 30 minutes before dialysis on Monday, Tuesday, Thursday, Saturday 02/05/19  Yes [provider]  midodrine (PROAMATINE) 2.5 MG tablet Take 1 tablet (2.5 mg total) by mouth 3 (three) times daily with meals. Patient should continue to take 10 mg in the morning prior to hemodialysis sessions. Patient taking differently: Take 2.5 mg by mouth See admin instructions. Take one tablet (2.5 mg) by mouth three times daily on Sunday, Wednesday and Friday; take one tablet (2.5 mg) twice daily on Monday, Tuesday, Thursday, Saturday (after dialysis and in the evening) . Patient should continue to take 10 mg tablet in the morning prior to hemodialysis sessions. 04/24/19  Yes Scot Jun, FNP  mirtazapine (REMERON) 15 MG tablet Take 7.5 mg by mouth at bedtime. For appetite/sleep 06/04/19  Yes [provider]  nitroGLYCERIN (NITROSTAT) 0.4 MG SL tablet Place 1 tablet (0.4 mg total) under the tongue every 5 (five) minutes as needed for chest pain. 04/19/18  Yes Rai, Ripudeep K, MD    oxyCODONE-acetaminophen (PERCOCET/ROXICET) 5-325 MG tablet Take 1-2 tablets by mouth every 6 (six) hours as needed for severe pain. 7/78/24  Yes Delora Fuel, MD  OXYGEN Inhale 3 L into the lungs continuous. 2-3 lpm 24/7    Yes [provider]  pantoprazole (PROTONIX) 40 MG tablet Take 1 tablet (40 mg total) by mouth daily. 04/29/19  Yes Deboraha Sprang, MD  sevelamer carbonate (RENVELA) 800 MG tablet  Take 1,600-2,400 mg by mouth See admin instructions. Take 3 tablets (2400 mg) by mouth with meals and 2 tablets (1600 mg) with snacks   Yes [provider]  vancomycin (VANCOCIN) 125 MG capsule Take 125 mg by mouth every 6 (six) hours.   Yes [provider]  ALPRAZolam (XANAX) 0.25 MG tablet Take 1 tablet (0.25 mg total) by mouth 2 (two) times daily as needed for anxiety. Patient not taking: Reported on 08/15/2019 02/24/19   Norval Morton, MD  insulin detemir (LEVEMIR) 100 UNIT/ML injection Inject 0.2 mLs (20 Units total) into the skin at bedtime. Patient not taking: Reported on 08/15/2019 04/18/19   Pokhrel, Corrie Mckusick, MD  polyethylene glycol powder (GLYCOLAX/MIRALAX) 17 GM/SCOOP powder Take 17 g by mouth 2 (two) times daily. Patient not taking: Reported on 08/15/2019 04/19/19   Montine Circle, PA-C    Inpatient Medications: Scheduled Meds:  apixaban  2.5 mg Oral BID   atorvastatin  80 mg Oral q1800   Chlorhexidine Gluconate Cloth  6 each Topical Q0600   clopidogrel  75 mg Oral Daily   doxercalciferol  1 mcg Intravenous Q M,W,F-HD   fluticasone furoate-vilanterol  1 puff Inhalation Daily   insulin aspart  0-5 Units Subcutaneous QHS   insulin aspart  0-9 Units Subcutaneous TID WC   mouth rinse  15 mL Mouth Rinse BID   midodrine  10 mg Oral TID WC   pantoprazole  40 mg Oral Daily   pneumococcal 23 valent vaccine  0.5 mL Intramuscular Tomorrow-1000   sevelamer carbonate  1,600 mg Oral TID WC   Continuous Infusions:  PRN Meds: acetaminophen, albuterol,  hydrOXYzine  Allergies:    Allergies  Allergen Reactions   Byetta 10 Mcg Pen [Exenatide] Diarrhea and Nausea And Vomiting   Telmisartan Other (See Comments)    Unknown reaction - reported by [facility]   Codeine Itching    Minor reaction   Coumadin [Warfarin Sodium] Rash    (wife states coumadin was stopped but rash did not disappear 02/21/19)    Social History:   Social History   Socioeconomic History   Marital status: Married    Spouse name: Not on file   Number of children: 3   Years of education: Not on file   Highest education level: Not on file  Occupational History   Occupation: retired  Scientist, product/process development strain: Not on file   Food insecurity    Worry: Not on file    Inability: Not on Lexicographer needs    Medical: Not on file    Non-medical: Not on file  Tobacco Use   Smoking status: Former Smoker    Packs/day: 2.00    Years: 32.00    Pack years: 64.00    Quit date: 11/27/1983    Years since quitting: 35.7   Smokeless tobacco: Never Used  Substance and Sexual Activity   Alcohol use: Yes    Comment: rarely   Drug use: No   Sexual activity: Yes    Birth control/protection: None  Lifestyle   Physical activity    Days per week: Not on file    Minutes per session: Not on file   Stress: Not on file  Relationships   Social connections    Talks on phone: Not on file    Gets together: Not on file    Attends religious service: Not on file    Active member of club or organization: Not on file  Attends meetings of clubs or organizations: Not on file    Relationship status: Not on file   Intimate partner violence    Fear of current or ex partner: Not on file    Emotionally abused: Not on file    Physically abused: Not on file    Forced sexual activity: Not on file  Other Topics Concern   Not on file  Social History Narrative   Not on file    Family History:    Family History  Problem Relation Age of  Onset   Diabetes Father    Heart disease Father    Diabetes Sister    Alzheimer's disease Mother    Stroke Mother    Diabetes Paternal Grandmother    Alzheimer's disease Maternal Aunt        x 8 Maternal Aunts   Cancer Maternal Uncle        type unknown     ROS:  Please see the history of present illness.  Review of Systems  Constitutional: Positive for chills (always has chills), diaphoresis and malaise/fatigue. Negative for fever.  Respiratory: Positive for shortness of breath. Negative for cough and sputum production.   Cardiovascular: Positive for chest pain, orthopnea, leg swelling and PND. Negative for palpitations.  Gastrointestinal: Negative for blood in stool, nausea and vomiting.  Genitourinary: Negative for hematuria.  Musculoskeletal: Negative for myalgias.  Neurological: Positive for dizziness (lightheaded) and loss of consciousness.   All other ROS reviewed and negative.     Physical Exam/Data:   Vitals:   08/18/19 1116 08/18/19 1230 08/18/19 1300 08/18/19 1330  BP: (!) 80/37  (!) 90/40   Pulse: (!) 55 (!) 129 (!) 128 (!) 55  Resp:  (!) _0 Temp: 97.9 F (36.6 C)     TempSrc: Oral     SpO2: 100% (!) 88% 99% 100%  Weight: 96.1 kg     Height:        Intake/Output Summary (Last 24 hours) at 08/18/2019 1516 Last data filed at 08/18/2019 1200 Gross per 24 hour  Intake 735 ml  Output 2052 ml  Net -1317 ml   Last 3 Weights 08/18/2019 08/18/2019 08/18/2019  Weight (lbs) 211 lb 13.8 oz 218 lb 0.6 oz 218 lb 0.6 oz  Weight (kg) 96.1 kg 98.9 kg 98.9 kg     Body mass index is 29.55 kg/m.  General: 78 y.o. male resting comfortably in no acute distress. HEENT: Normocephalic and atraumatic. No scleral icterus. Neck: Supple. No carotid bruits. No JVD. Heart: RRR. Distinct S1 and S2. Possible soft systolic murmur along lower sternal border. No rubs or gallops. Radial pulses soft but present. Lungs: No increased work of breathing. Clear to ausculation  bilaterally. No wheezes, rhonchi, or rales.  Abdomen: Soft, non-distended, and non-tender to palpation. Bowel sounds present. MSK: Generalized weakness. Moves all extremities freely. Extremities: S/p left below knee amputation. Right lower extremity wrapped. Hyperpigmentation of right lower extremity with chronic ulcer.  Skin: Warm and dry.  Neuro: Alert and oriented x3. No focal deficits. Psych: Normal affect. Responds appropriately.   EKG:  The EKG was personally reviewed and demonstrates:  Ventricular paced rhythm with rate of 55 bpm. Unable to really assess ST/T changes. Telemetry:  Telemetry was personally reviewed and demonstrates:  V paced rhythm with PVCs (some triplets).  Relevant CV Studies:  Echocardiogram 08/15/2019: 1. Left ventricular ejection fraction, by visual estimation, is 25 to 30%. The left ventricle has severely decreased function. Moderately increased left ventricular  size. There is no left ventricular hypertrophy. There has been a decline in LVEF  compared to prior study.  2. Multiple segmental abnormalities exist. See findings.  3. Indeterminate diastolic filling due to E-A fusion pattern of LV diastolic filling.  4. Global right ventricle has moderately reduced systolic function.The right ventricular size is moderately enlarged. No increase in right ventricular wall thickness.  5. Left atrial size was moderately dilated.  6. Right atrial size was moderately dilated.  7. Moderate mitral annular calcification.  8. The mitral valve is degenerative. Moderate mitral valve regurgitation.  9. The tricuspid valve is grossly normal. Tricuspid valve regurgitation moderate. 10. Aortic valve mean gradient measures 8.0 mmHg. 11. Aortic valve regurgitation is trivial by color flow Doppler. 12. 29 mm Edwards Sapien 3 (TAVR) in aortic position. No obvious perivalvular leak noted. 13. The pulmonic valve was not well visualized. Pulmonic valve regurgitation is not visualized by  color flow Doppler. 14. Moderately elevated pulmonary artery systolic pressure. 15. The tricuspid regurgitant velocity is 3.15 m/s, and with an assumed right atrial pressure of 15 mmHg, the estimated right ventricular systolic pressure is moderately elevated at 54.7 mmHg. 16. A pacer wire is visualized. 17. The inferior vena cava is dilated in size with <50% respiratory variability, suggesting right atrial pressure of 15 mmHg._0 _______________  Coronary Angiogram 07/31/2018:  Colon Flattery 2nd Diag lesion is 70% stenosed.  Previously placed Prox LAD-2 drug eluting stent is widely patent.  Previously placed Prox LAD-1 drug eluting stent is widely patent.  Prox Cx to Mid Cx lesion is 30% stenosed.  Prox RCA to Dist RCA lesion is 100% stenosed.  Ost Cx lesion is 95% stenosed.  Post intervention, there is a 10% residual stenosis.  Scoring balloon angioplasty was performed using a BALLOON [place] 4.00X10.  Balloon angioplasty was performed using a BALLOON [place] EMERGE MR 4.5X12.   1. Severe 2 vessel obstructive CAD    - 95% in stent restenosis of the ostial LCx    - 100% CTO of the RCA 2. Stent in the proximal to mid LAD is patent 3. Successful scoring balloon/PTCA of the ostial LCx  Laboratory Data:  High Sensitivity Troponin:   Recent Labs  Lab 08/14/19 2016 08/14/19 2210  TROPONINIHS 48* 49*     Chemistry Recent Labs  Lab 08/16/19 0417 08/17/19 0727 08/18/19 0335  NA 133* 132* 132*  K 4.6 4.6 3.6  CL 94* 92* 94*  CO2 _1 GLUCOSE 156* 143* 225*  BUN 22 33* 20  CREATININE 3.60* 4.48* 3.47*  CALCIUM 9.0 9.2 8.6*  GFRNONAA 15* 12* 16*  GFRAA 17* 13* 18*  ANIONGAP 16* 18* 11    Recent Labs  Lab 08/14/19 2016 08/15/19 0144 08/16/19 0417 08/17/19 0727  PROT 6.4* 6.4*  --   --   ALBUMIN 3.0* 3.2* 3.0* 3.0*  AST 33 36  --   --   ALT 20 22  --   --   ALKPHOS 135* 138*  --   --   BILITOT 2.0* 2.1*  --   --    Hematology Recent Labs  Lab 08/16/19 0417  08/17/19 0727 08/18/19 0335  WBC 8.2 10.1 8.5  RBC 3.69* 3.86* 3.67*  HGB 11.4* 11.5* 11.3*  HCT 34.7* 36.4* 34.3*  MCV 94.0 94.3 93.5  MCH 30.9 29.8 30.8  MCHC 32.9 31.6 32.9  RDW 16.4* 16.5* 16.7*  PLT 187 217 169   BNP Recent Labs  Lab 08/14/19 2016  BNP 1,710.9*  DDimer No results for input(s): DDIMER in the last 168 hours.   Radiology/Studies:  Ct Angio Chest Pe W And/or Wo Contrast  Result Date: 08/15/2019 CLINICAL DATA:  Shortness of breath EXAM: CT ANGIOGRAPHY CHEST WITH CONTRAST TECHNIQUE: Multidetector CT imaging of the chest was performed using the standard protocol during bolus administration of intravenous contrast. Multiplanar CT image reconstructions and MIPs were obtained to evaluate the vascular anatomy. CONTRAST:  37m OMNIPAQUE IOHEXOL 350 MG/ML SOLN COMPARISON:  CT dated 07/21/2015. FINDINGS: Cardiovascular: Evaluation is severely limited by contrast bolus timing. Given this limitation, no large centrally located pulmonary embolus was detected. Detection of smaller segmental and subsegmental pulmonary emboli is significantly limited. The main pulmonary artery is dilated measuring approximately 4.5 cm in diameter.The cardiac size is enlarged. The patient is status post prior TAVR. Coronary artery calcifications and thoracic aortic calcifications are noted. There are multiple chest wall collaterals suggestion stenosis of the right brachiocephalic vein likely secondary to the presence of a right-sided dual chamber pacemaker. There is reflux of contrast into the IVC. Mediastinum/Nodes: --No mediastinal or hilar lymphadenopathy. --No axillary lymphadenopathy. --No supraclavicular lymphadenopathy. --there is a left-sided thyroid nodule measuring approximately 2.7 cm. --The esophagus is unremarkable Lungs/Pleura: There is atelectasis at the right lung base. There is a trace right-sided pleural effusion. There is no focal infiltrate. Upper Abdomen: There is a 2.3 cm left-sided  adrenal nodule which has decreased in size from prior CT in 2016. The patient is status post prior cholecystectomy. Musculoskeletal: No chest wall abnormality. No acute or significant osseous findings. Review of the MIP images confirms the above findings. IMPRESSION: 1. The study is limited by contrast bolus timing. Given this limitation, no large centrally located pulmonary embolus was detected. Detection of smaller segmental and subsegmental pulmonary emboli is significantly limited. 2. Dilated main pulmonary artery which can be seen in patients with elevated pulmonary artery pressures. 3. Cardiomegaly with reflux of contrast into the IVC, suggesting right heart failure. 4. Trace right pleural effusion. 5. Left-sided thyroid nodule measuring approximately 2.7 cm. Further evaluation with a nonemergent outpatient thyroid ultrasound is recommended. Aortic Atherosclerosis (ICD10-I70.0). Electronically Signed   By: CConstance HolsterM.D.   On: 08/15/2019 00:47   Dg Chest Portable 1 View  Result Date: 08/14/2019 CLINICAL DATA:  Shortness of breath EXAM: PORTABLE CHEST 1 VIEW COMPARISON:  07/21/2019 FINDINGS: Right-sided pacing device as before. Enlarged cardiomediastinal silhouette with vascular congestion. Aortic atherosclerosis. No pleural effusion or pneumothorax. IMPRESSION: Cardiomegaly with vascular congestion. Electronically Signed   By: KDonavan FoilM.D.   On: 08/14/2019 20:29    Assessment and Plan:   Acute on Chronic Combined CHF  - Patient presented with acute on chronic respiratory failure with hypoxia (felt to be secondary to volume overload in setting of ESRD which is a recurrent problem complicated by hypotension) and syncopal episode. - BNP 1,710.9 - Chest x-ray showed cardiomegaly with vascular congestion. - Echo this admission showed LVEF of 25-30% (down from 45-50% in 03/2018). See full report above. - Does not appear significantly volume overloaded on exam. Volume status managed with  dialysis.  - Cause of newly reduced EF included ischemia or pacing-induced cardiomyopathy. Will start by interrogating device tomorrow.  Syncopal Episode - Patient had reported witnessed syncopal episode. Patient reports feeling lightheadedness and weak with some chest pressure.  - EKG and telemetry shows v-paced rhythm with some PVCs (a few triplets). - With newly reduced, question whether this could be a ventricular tachycardia. Patient did note brief episode of hearts  in the 200's on pulse ox machine at home a couple of hours prior to syncopal episode. Differential also includes ischemia, vasovagal, hypoxia, and hypotension. - Patient has PPM so will have this interrogated tomorrow.  Persistent Atrial Fibrillation/Complete Heart Block s/p PPM - Device interrogation from 04/2019 showed atrial burden of 100%.  - Telemetry shows ventricular paced rhythm with PVCs (some triplets). - Continue anticoagulation with Eliquis 2.61m twice daily.  Chest Pain with Known CAD - Patient reports episode of substernal chest pressure that radiated to back and gums prior to syncopal episode. Also reports similar episodes of this every few days for the last couple of months. Patient has known multivessel CAD s/p PTCA/orbital atherectomy/DES of mid LAD and ostium circumflex and chronically occluded RCA on cath in 03/2018 with in-stent restenosis of ostium circumflex in 07/2018 requiring balloon/PTCA. - Given ventricular paced rhythm on EKG, difficult to assess for any ST/T changes. - Troponin mildly elevated and flat at 48 >>49. Not consistent with ACS. - May need additional ischemic work-up pending device interrogation. - Continue Plavix and statin. Not on Aspirin due to need for Eliquis.  S/p TAVR in 2016 - No obvious perivalvular leak noted on Echo this admission.  Hypotensive - Patient chronically hypotensive. On Midodrine 2.581mthree times daily and then takes 10 mg on mornings prior to dialysis and then  receives another 10 mg during dialysis. - BP soft but stable at 97/78.  ESRD on Hemodialysis  - Patient recently had thrombectomy of fistula. - Currently on M/T/Th dialysis schedule.  - Requires midodrine for hypotension as stated above. - Management per primary team.  Hyperkalemia - Potassium 6.1 on admission. Improved with dialysis.  Otherwise, per primary team.    For questions or updates, please contact [facility] consult [email or web address] for contact info under     Signed, CaDarreld McleanPA-C  08/18/2019 3:16 PM

## 2019-08-18 NOTE — Progress Notes (Addendum)
Jeffrey [name]  [id number] DOB: 24-Mar-1941 DOA: 08/14/2019 PCP: Scot Jun, FNP   Brief Narrative:  78 yo WM PMHx CVA, chronic respiratory failure/COPD on 2 L O2 via [place] at home, complete heart block s/p pacemaker, chronic systolic and diastolic CHF (EF [id number]), s/p TAVR, CAD, HTN, PVD, HLD, ESRD on HD M/W/F, s/p LEFT BKA, Diabetes type 2 uncontrolled with complication  with intermittent SOB and chest pain, in ED on NRB. fistula recently clotted but per patient is patient and intended for use Saturday.  PCCM consulted for admission, ? iHD cath placement if CRRT needed this evening.     Subjective: 9/22 A/O x4, negative CP, negative S OB, negative abdominal pain, negative N/V.  States has been trying to get into see cardiologist for~8 months, Without success.  States if he is going to die would rather just go home.   Assessment & Plan:   Active Problems:   Acute respiratory failure with hypoxia (HCC)   End-stage renal disease on hemodialysis (HCC)   Chronic hypotension   Acute systolic CHF (congestive heart failure) (HCC)   AF (paroxysmal atrial fibrillation) (HCC)   Complete heart block (HCC)   Presence of permanent cardiac pacemaker   S/P BKA (below knee amputation), left (HCC)  Syncope and collapse - Currently asymptomatic. - DDX in this patient vasovagal, arrhythmia,NSTEMI, hypoxia, hypotension CVA. - PE ruled out by CTA chest - Thyroid nodule 2.7 cm.  TSH pending  Acute on chronic hypoxic respiratory failure - in the setting of volume overload in ESRD; CT chest negative for PE - stable on 5L HFNC; wean to home 3L as able to maintain sats [id number]  Acute systolic CHF - echo [id number] with decline in LVEF since prior study (25-30%) - follows with Dr. Caryl Comes -Eliquis 2.5 mg BID -Plavix 75 mg daily -9/22 change to midodrine 10 mg TID - 9/22 consulted cardiology given patient's significant decline in EF over the last 12 months.  Did patient become  hypotensive vs arrhythmia -Strict in and out -3.0 L -Daily weight -We will await cardiology recommendations  Paroxysmal atrial fibrillation -See CHF  Complete heart block - S/p pacer  CAD  Aortic valve replacement -76mm Edwards Sapien bioprosthetic, stented aortic valve (TAVR) valve is present  Chronic hypertension - See CHF  Thyroid nodule - See syncope and collapse  ESRD on HD T/TH/SAT? - Nephrology plans on performing HD in the a.m. - Continue HD per nephrology  Hyperkalemia -dialyzes 4x per week due to hypotension and chronic volume overload due to early sign off and poor fluid restrictionat home - K stable after HD yesterday   Left BKA -Stable  Diabetes type 2 uncontrolled with complication - [id number] Hemoglobin A1c= 9.3 -Sensitive SSI - Lipid panel pending  Goals of care - 9/22 palliative care consult: Per staff patient noncompliant with medications, O2.  Per patient if he is going to die he would like to be at home to die.  Evaluate for code changed to DNR   DVT prophylaxis: Eliquis Code Status: Full Family Communication: None Disposition Plan: TBD   Consultants:  Nephrology PCCM Cardiology consult pending   Procedures/Significant Events:  9/18 CTA chest PE protocol:-Negative PE (within limits of study) -Dilated main pulmonary artery which can be seen in patients with elevated pulmonary artery pressures. -Cardiomegaly with reflux of contrast into the IVC, suggesting right heart failure. -Left-sided thyroid nodule measuring approximately 2.7 cm. Further evaluation with a nonemergent outpatient thyroid ultrasound is recommended. 9/19 echocardiogram;Left  Ventricle: EF =25 to 30%. severely decreased function.- Moderately increased left ventricular size.  -Spectral Doppler shows  Indeterminate diastolic filling due to E-A fusion pattern of LV diastolic filling. -LV Wall Scoring: The basal and mid inferolateral wall and basal inferior segment are  akinetic. The entire apex, entire anterior septum, mid and apical inferior wall, and basal inferoseptal segment are hypokinetic. All remaining scored segments are normal. -Right Ventricle:  moderately enlarged.-estimated right ventricular systolic pressure is moderately elevated at 54.7 mmHg. -Left Atrium: moderately dilated. -Right Atrium: moderately dilated -Mitral Valve:  Moderate mitral valve regurgitation. -Tricuspid Valve: regurgitation moderate by color flow Doppler. -Aortic Valve: Aortic valve has been repaired/replaced. 59mm Edwards Sapien bioprosthetic, stented aortic valve (TAVR) valve is present in the aortic position. 29 mm Edwards Sapien 3 (TAVR) in aortic position. -Venous: The inferior vena cava is dilated in size with less than 50% respiratory variability, suggesting right atrial pressure of 15 mmHg.   I have personally reviewed and interpreted all radiology studies and my findings are as above.  VENTILATOR SETTINGS:    Cultures   Antimicrobials:    Devices    LINES / TUBES:      Continuous Infusions:   Objective: Vitals:   08/18/19 0427 08/18/19 0500 08/18/19 0600 08/18/19 0700  BP:  (!) 86/52 (!) 84/69 (!) 85/46  Pulse:  (!) 55 71 (!) 54  Resp:  (!) 7 14 15   Temp: 98.2 F (36.8 C)     TempSrc: Oral     SpO2:  98% (!) 88% 99%  Weight:  98.2 kg    Height:        Intake/Output Summary (Last 24 hours) at 08/18/2019 0746 Last data filed at 08/17/2019 2300 Gross per 24 hour  Intake 840 ml  Output 2556 ml  Net -1716 ml   Filed Weights   08/17/19 0630 08/17/19 1137 08/18/19 0500  Weight: 99.3 kg 96.3 kg 98.2 kg   Physical Exam:  General: A/O x4, positive acute on chronic respiratory distress Eyes: negative scleral hemorrhage, negative anisocoria, negative icterus ENT: Negative Runny nose, negative gingival bleeding, Neck:  Negative scars, masses, torticollis, lymphadenopathy, JVD Lungs: Clear to auscultation bilaterally without wheezes or  crackles Cardiovascular: Regular rate and rhythm without murmur gallop or rub normal S1 and S2 Abdomen: Obese, negative abdominal pain, nondistended, positive soft, bowel sounds, no rebound, no ascites, no appreciable mass Extremities: LEFT BKA stable, RLE wrapped in gauze, +1 DP/PT pulse, positive bilateral upper extremity edema  Skin: RLE venous stasis ulcers chronic Psychiatric:  Negative depression, negative anxiety, negative fatigue, negative mania  Central nervous system:  Cranial nerves II through XII intact, tongue/uvula midline, all extremities muscle strength 5/5, sensation intact throughout, negative dysarthria, negative expressive aphasia, negative receptive aphasia. .     Data Reviewed: Care during the described time interval was provided by me .  I have reviewed this patient's available data, including medical history, events of note, physical examination, and all test results as part of my evaluation.   CBC: Recent Labs  Lab 08/14/19 2016 08/14/19 2046 08/15/19 0425 08/16/19 0417 08/17/19 0727 08/18/19 0335  WBC 6.9  --  7.1 8.2 10.1 8.5  NEUTROABS 4.7  --   --   --   --   --   HGB 11.9* 12.9* 11.4* 11.4* 11.5* 11.3*  HCT 37.9* 38.0* 35.1* 34.7* 36.4* 34.3*  MCV 98.4  --  95.1 94.0 94.3 93.5  PLT 172  --  185 187 217 [id number]   Basic Metabolic  Panel: Recent Labs  Lab 08/15/19 0144 08/15/19 0425 08/16/19 0417 08/17/19 0727 08/18/19 0335  NA 135 136 133* 132* 132*  K 5.8* 6.2* 4.6 4.6 3.6  CL 95* 96* 94* 92* 94*  CO2 23 24 23 22 27   GLUCOSE 146* 145* 156* 143* 225*  BUN 38* 39* 22 33* 20  CREATININE 5.27* 5.34* 3.60* 4.48* 3.47*  CALCIUM 9.0 9.2 9.0 9.2 8.6*  MG 1.8  --  1.8  --  1.8  PHOS 6.8*  --  5.4* 6.0*  --    GFR: Estimated Creatinine Clearance: 20.3 mL/min (A) (by C-G formula based on SCr of 3.47 mg/dL (H)). Liver Function Tests: Recent Labs  Lab 08/14/19 2016 08/15/19 0144 08/16/19 0417 08/17/19 0727  AST 33 36  --   --   ALT 20 22  --   --     ALKPHOS 135* 138*  --   --   BILITOT 2.0* 2.1*  --   --   PROT 6.4* 6.4*  --   --   ALBUMIN 3.0* 3.2* 3.0* 3.0*   No results for input(s): LIPASE, AMYLASE in the last 168 hours. No results for input(s): AMMONIA in the last 168 hours. Coagulation Profile: No results for input(s): INR, PROTIME in the last 168 hours. Cardiac Enzymes: No results for input(s): CKTOTAL, CKMB, CKMBINDEX, TROPONINI in the last 168 hours. BNP (last 3 results) No results for input(s): PROBNP in the last 8760 hours. HbA1C: No results for input(s): HGBA1C in the last 72 hours. CBG: Recent Labs  Lab 08/16/19 2211 08/17/19 0639 08/17/19 1203 08/17/19 1622 08/17/19 2207  GLUCAP 230* 138* 101* 190* 197*   Lipid Profile: No results for input(s): CHOL, HDL, LDLCALC, TRIG, CHOLHDL, LDLDIRECT in the last 72 hours. Thyroid Function Tests: No results for input(s): TSH, T4TOTAL, FREET4, T3FREE, THYROIDAB in the last 72 hours. Anemia Panel: No results for input(s): VITAMINB12, FOLATE, FERRITIN, TIBC, IRON, RETICCTPCT in the last 72 hours. Urine analysis:    Component Value Date/Time   COLORURINE YELLOW 06/17/2017 [place] 06/17/2017 0547   LABSPEC 1.010 06/17/2017 0547   PHURINE 6.0 06/17/2017 0547   GLUCOSEU >=500 (A) 06/17/2017 0547   HGBUR SMALL (A) 06/17/2017 0547   BILIRUBINUR NEGATIVE 06/17/2017 0547   KETONESUR NEGATIVE 06/17/2017 0547   PROTEINUR 100 (A) 06/17/2017 0547   UROBILINOGEN 0.2 09/28/2014 2257   NITRITE NEGATIVE 06/17/2017 0547   LEUKOCYTESUR TRACE (A) 06/17/2017 0547   Sepsis Labs: @LABRCNTIP (procalcitonin:4,lacticidven:4)  ) Recent Results (from the past 240 hour(s))  Blood culture (routine x 2)     Status: None (Preliminary result)   Collection Time: 08/14/19  8:04 PM   Specimen: BLOOD RIGHT HAND  Result Value Ref Range Status   Specimen Description BLOOD RIGHT HAND  Final   Special Requests   Final    BOTTLES DRAWN AEROBIC AND ANAEROBIC Blood Culture results  may not be optimal due to an inadequate volume of blood received in culture bottles   Culture   Final    NO GROWTH 3 DAYS Performed at [facility], [place] [address], [place], [place] [address]    Report Status PENDING  Incomplete  SARS Coronavirus 2 [facility] order, Performed in [facility] hospital lab) Nasopharyngeal Nasopharyngeal Swab     Status: None   Collection Time: 08/14/19  8:16 PM   Specimen: Nasopharyngeal Swab  Result Value Ref Range Status   SARS Coronavirus 2 NEGATIVE NEGATIVE Final    Comment: (NOTE) If result  is NEGATIVE SARS-CoV-2 target nucleic acids are NOT DETECTED. The SARS-CoV-2 RNA is generally detectable in upper and lower  respiratory specimens during the acute phase of infection. The lowest  concentration of SARS-CoV-2 viral copies this assay can detect is 250  copies / mL. A negative result does not preclude SARS-CoV-2 infection  and should not be used as the sole basis for treatment or other  patient management decisions.  A negative result may occur with  improper specimen collection / handling, submission of specimen other  than nasopharyngeal swab, presence of viral mutation(s) within the  areas targeted by this assay, and inadequate number of viral copies  (<250 copies / mL). A negative result must be combined with clinical  observations, patient history, and epidemiological information. If result is POSITIVE SARS-CoV-2 target nucleic acids are DETECTED. The SARS-CoV-2 RNA is generally detectable in upper and lower  respiratory specimens dur ing the acute phase of infection.  Positive  results are indicative of active infection with SARS-CoV-2.  Clinical  correlation with patient history and other diagnostic information is  necessary to determine patient infection status.  Positive results do  not rule out bacterial infection or co-infection with other viruses. If result is PRESUMPTIVE POSTIVE SARS-CoV-2 nucleic acids MAY BE PRESENT.   A  presumptive positive result was obtained on the submitted specimen  and confirmed on repeat testing.  While 2019 novel coronavirus  (SARS-CoV-2) nucleic acids may be present in the submitted sample  additional confirmatory testing may be necessary for epidemiological  and / or clinical management purposes  to differentiate between  SARS-CoV-2 and other Sarbecovirus currently known to infect humans.  If clinically indicated additional testing with an alternate test  methodology [id number]) is advised. The SARS-CoV-2 RNA is generally  detectable in upper and lower respiratory sp ecimens during the acute  phase of infection. The expected result is Negative. Fact Sheet for Patients:  StrictlyIdeas.no Fact Sheet for Healthcare Providers: BankingDealers.co.za This test is not yet approved or cleared by the [place] FDA and has been authorized for detection and/or diagnosis of SARS-CoV-2 by FDA under an Emergency Use Authorization (EUA).  This EUA will remain in effect (meaning this test can be used) for the duration of the COVID-19 declaration under Section 564(b)(1) of the Act, 21 U.S.C. section 360bbb-3(b)(1), unless the authorization is terminated or revoked sooner. Performed at [facility], [place] [address], [place], [place] [address]   Blood culture (routine x 2)     Status: None (Preliminary result)   Collection Time: 08/15/19  4:25 AM   Specimen: BLOOD  Result Value Ref Range Status   Specimen Description BLOOD RIGHT ANTECUBITAL  Final   Special Requests   Final    BOTTLES DRAWN AEROBIC AND ANAEROBIC Blood Culture adequate volume   Culture   Final    NO GROWTH 2 DAYS Performed at [facility], [place] [address] [place],  [address]    Report Status PENDING  Incomplete  MRSA PCR Screening     Status: None   Collection Time: 08/15/19  5:00 AM   Specimen: Nasopharyngeal  Result Value Ref Range Status   MRSA by PCR  NEGATIVE NEGATIVE Final    Comment:        The GeneXpert MRSA Assay (FDA approved for NASAL specimens only), is one component of a comprehensive MRSA colonization surveillance program. It is not intended to diagnose MRSA infection nor to guide or monitor treatment for MRSA infections. Performed at [facility]  [facility], [place] [address] [place], [place] [address]          Radiology Studies: No results found.      Scheduled Meds:  apixaban  2.5 mg Oral BID   atorvastatin  80 mg Oral q1800   Chlorhexidine Gluconate Cloth  6 each Topical Q0600   clopidogrel  75 mg Oral Daily   doxercalciferol  1 mcg Intravenous Q M,W,F-HD   fluticasone furoate-vilanterol  1 puff Inhalation Daily   insulin aspart  0-5 Units Subcutaneous QHS   insulin aspart  0-9 Units Subcutaneous TID WC   mouth rinse  15 mL Mouth Rinse BID   midodrine  10 mg Oral Q Mon   And   midodrine  10 mg Oral Q Mon-HD   midodrine  10 mg Oral Q M,W,F   And   midodrine  10 mg Oral Q T,Th,Sa-HD   midodrine  2.5 mg Oral TID WC   pantoprazole  40 mg Oral Daily   pneumococcal 23 valent vaccine  0.5 mL Intramuscular Tomorrow-1000   sevelamer carbonate  1,600 mg Oral TID WC   Continuous Infusions:   LOS: 3 days   The patient is critically ill with multiple organ systems failure and requires high complexity decision making for assessment and support, frequent evaluation and titration of therapies, application of advanced monitoring technologies and extensive interpretation of multiple databases. Critical Care Time devoted to patient care services described in this note  Time spent: 40 minutes     Lindzee Gouge, Geraldo Docker, MD [facility] Pager [phone]  If 7PM-7AM, please contact night-coverage [email or web address] Password [facility] 08/18/2019, 7:46 AM

## 2019-08-18 NOTE — Progress Notes (Signed)
[facility] NEPHROLOGY PROGRESS NOTE  Assessment/ Plan: Pt is a 78 y.o. yo male ESRD on HD, AS s/p TAVR, left BKA, chronic hypertension admitted with hypoxia and chest pain.  Dialysis: [facility] MTTS   95.5kg   F200  2/2.25 bath  Hep none  LUE AVF    Jeffrey [name] 1 mg q rx   Venofer 50 mg q week   No ESA  #SOB/hypoxia due to volume overload.  This is a recurrent problem complicated by hypotension. Receiving midodrine before and during dialysis.  Goal UF around 2.5 L.  # ESRD: doing MTTS schedule. HD yesterday and today. Next HD on Thursday.   Monitor lab.  # Anemia: Hemoglobin acceptable.  No ESA.  # Secondary hyperparathyroidism: resume Jeffrey [name] and binders.  #Hypotension/volume: Continue midodrine.  Has chronic low blood pressure.  #Dialysis Access: Recent fistulogram.  #PAD status post left BKA   Subjective: Seen and examined in dialysis unit.  Hypotensive, chronic issue.  Denies headache, dizziness, chest pain or shortness of breath.  Tolerating well so far. Objective Vital signs in last 24 hours: Vitals:   08/18/19 0700 08/18/19 0705 08/18/19 0747 08/18/19 0824  BP: (!) 85/46 (!) 87/45 (!) 83/42 (!) 71/36  Pulse: (!) 54 (!) 54 (!) 54 (!) 54  Resp: 15 18 18    Temp:  (!) 97.5 F (36.4 C)    TempSrc:  Oral    SpO2: 99% 100% 100% 99%  Weight:  98.9 kg 98.9 kg   Height:       Weight change: -3 kg  Intake/Output Summary (Last 24 hours) at 08/18/2019 0841 Last data filed at 08/17/2019 2300 Gross per 24 hour  Intake 840 ml  Output 2556 ml  Net -1716 ml       Labs: Basic Metabolic Panel: Recent Labs  Lab 08/15/19 0144  08/16/19 0417 08/17/19 0727 08/18/19 0335  NA 135   < > 133* 132* 132*  K 5.8*   < > 4.6 4.6 3.6  CL 95*   < > 94* 92* 94*  CO2 23   < > 23 22 27   GLUCOSE 146*   < > 156* 143* 225*  BUN 38*   < > 22 33* 20  CREATININE 5.27*   < > 3.60* 4.48* 3.47*  CALCIUM 9.0   < > 9.0 9.2 8.6*  PHOS 6.8*  --  5.4* 6.0*  --    < > = values in  this interval not displayed.   Liver Function Tests: Recent Labs  Lab 08/14/19 2016 08/15/19 0144 08/16/19 0417 08/17/19 0727  AST 33 36  --   --   ALT 20 22  --   --   ALKPHOS 135* 138*  --   --   BILITOT 2.0* 2.1*  --   --   PROT 6.4* 6.4*  --   --   ALBUMIN 3.0* 3.2* 3.0* 3.0*   No results for input(s): LIPASE, AMYLASE in the last 168 hours. No results for input(s): AMMONIA in the last 168 hours. CBC: Recent Labs  Lab 08/14/19 2016  08/15/19 0425 08/16/19 0417 08/17/19 0727 08/18/19 0335  WBC 6.9  --  7.1 8.2 10.1 8.5  NEUTROABS 4.7  --   --   --   --   --   HGB 11.9*   < > 11.4* 11.4* 11.5* 11.3*  HCT 37.9*   < > 35.1* 34.7* 36.4* 34.3*  MCV 98.4  --  95.1 94.0 94.3 93.5  PLT 172  --  185 187 217 169   < > = values in this interval not displayed.   Cardiac Enzymes: No results for input(s): CKTOTAL, CKMB, CKMBINDEX, TROPONINI in the last 168 hours. CBG: Recent Labs  Lab 08/16/19 2211 08/17/19 0639 08/17/19 1203 08/17/19 1622 08/17/19 2207  GLUCAP 230* 138* 101* 190* 197*    Iron Studies: No results for input(s): IRON, TIBC, TRANSFERRIN, FERRITIN in the last 72 hours. Studies/Results: No results found.  Medications: Infusions:   Scheduled Medications: . apixaban  2.5 mg Oral BID  . atorvastatin  80 mg Oral q1800  . Chlorhexidine Gluconate Cloth  6 each Topical Q0600  . clopidogrel  75 mg Oral Daily  . doxercalciferol  1 mcg Intravenous Q M,W,F-HD  . fluticasone furoate-vilanterol  1 puff Inhalation Daily  . insulin aspart  0-5 Units Subcutaneous QHS  . insulin aspart  0-9 Units Subcutaneous TID WC  . mouth rinse  15 mL Mouth Rinse BID  . midodrine  10 mg Oral Q Mon   And  . midodrine  10 mg Oral Q Mon-HD  . midodrine  10 mg Oral Q M,W,F   And  . midodrine  10 mg Oral Q T,Th,Sa-HD  . midodrine  2.5 mg Oral TID WC  . pantoprazole  40 mg Oral Daily  . pneumococcal 23 valent vaccine  0.5 mL Intramuscular Tomorrow-1000  . sevelamer carbonate   1,600 mg Oral TID WC    have reviewed scheduled and prn medications.  Physical Exam: unchanged General:NAD, comfortable Heart: Irregular heart rate, s1s2 nl Lungs: Bibasal coarse breath sound, no wheezing Abdomen:soft, Non-tender, non-distended Extremities: Left BKA, has dependent edema Dialysis Access: Left upper extremity AV fistula Skin no rash or ulcer.  Jeffrey [name] Jeffrey [name] 08/18/2019,8:41 AM  LOS: 3 days  Pager: ID:[phone]

## 2019-08-18 NOTE — Progress Notes (Signed)
PT Cancellation Note  Patient Details Name: Jeffrey [name] MRN: [id number] DOB: 09/26/1941   Cancelled Treatment:    Reason Eval/Treat Not Completed: Fatigue/lethargy limiting ability to participate, patient sitting on EOB with family. Declines to move to recliner. Wife asking about mechanical lift and need for portable O2. Informed her that case management is aware.    Claretha Cooper 08/18/2019, 3:09 PM [place] Pager [phone] Office [phone]

## 2019-08-18 NOTE — Procedures (Signed)
Patient was seen on dialysis and the procedure was supervised.  BFR 400  Via AVF BP is  [id number] systolic.   Patient appears to be tolerating treatment well  Thierry Dobosz Tanna Furry 08/18/2019

## 2019-08-19 DIAGNOSIS — Z7189 Other specified counseling: Secondary | ICD-10-CM

## 2019-08-19 DIAGNOSIS — I5042 Chronic combined systolic (congestive) and diastolic (congestive) heart failure: Secondary | ICD-10-CM

## 2019-08-19 DIAGNOSIS — Z515 Encounter for palliative care: Secondary | ICD-10-CM

## 2019-08-19 DIAGNOSIS — N186 End stage renal disease: Secondary | ICD-10-CM

## 2019-08-19 DIAGNOSIS — L8990 Pressure ulcer of unspecified site, unspecified stage: Secondary | ICD-10-CM | POA: Insufficient documentation

## 2019-08-19 DIAGNOSIS — I4821 Permanent atrial fibrillation: Secondary | ICD-10-CM

## 2019-08-19 LAB — CBC
HCT: 34.5 % — ABNORMAL LOW (ref 39.0–52.0)
Hemoglobin: 11.1 g/dL — ABNORMAL LOW (ref 13.0–17.0)
MCH: 30.7 pg (ref 26.0–34.0)
MCHC: 32.2 g/dL (ref 30.0–36.0)
MCV: 95.3 fL (ref 80.0–100.0)
Platelets: 164 10*3/uL (ref 150–400)
RBC: 3.62 MIL/uL — ABNORMAL LOW (ref 4.22–5.81)
RDW: 17 % — ABNORMAL HIGH (ref 11.5–15.5)
WBC: 7 10*3/uL (ref 4.0–10.5)
nRBC: 0 % (ref 0.0–0.2)

## 2019-08-19 LAB — BASIC METABOLIC PANEL
Anion gap: 12 (ref 5–15)
BUN: 14 mg/dL (ref 8–23)
CO2: 27 mmol/L (ref 22–32)
Calcium: 9.2 mg/dL (ref 8.9–10.3)
Chloride: 95 mmol/L — ABNORMAL LOW (ref 98–111)
Creatinine, Ser: 2.92 mg/dL — ABNORMAL HIGH (ref 0.61–1.24)
GFR calc Af Amer: 22 mL/min — ABNORMAL LOW (ref >60)
GFR calc non Af Amer: 19 mL/min — ABNORMAL LOW (ref >60)
Glucose, Bld: 190 mg/dL — ABNORMAL HIGH (ref 70–99)
Potassium: 3.4 mmol/L — ABNORMAL LOW (ref 3.5–5.1)
Sodium: 134 mmol/L — ABNORMAL LOW (ref 135–145)

## 2019-08-19 LAB — GLUCOSE, CAPILLARY
Glucose-Capillary: 186 mg/dL — ABNORMAL HIGH (ref 70–99)
Glucose-Capillary: 156 mg/dL — ABNORMAL HIGH (ref 70–99)
Glucose-Capillary: 215 mg/dL — ABNORMAL HIGH (ref 70–99)
Glucose-Capillary: 201 mg/dL — ABNORMAL HIGH (ref 70–99)

## 2019-08-19 LAB — CULTURE, BLOOD (ROUTINE X 2): Culture: NO GROWTH

## 2019-08-19 LAB — MAGNESIUM: Magnesium: 1.9 mg/dL (ref 1.7–2.4)

## 2019-08-19 MED ORDER — CHLORHEXIDINE GLUCONATE CLOTH 2 % EX PADS
6.0000 | MEDICATED_PAD | Freq: Every day | CUTANEOUS | Status: DC
  Administered 2019-08-19: 11:00:00 6 via TOPICAL

## 2019-08-19 NOTE — Progress Notes (Signed)
PT Cancellation Note  Patient Details Name: Jeffrey [name] MRN: [id number] DOB: [birth year]   Cancelled Treatment:    Reason Eval/Treat Not Completed: Other (comment)(Pt sitting EOB on arrival. Refuses OOB.  Pts wife requesting hoyer lift for home use. )Need to determine if PT is indicated in hospital as pt appears to be functioning at baseline.     Godfrey Pick Kazi Montoro 08/19/2019, 12:12 PM Tiffine Henigan,PT Acute Rehabilitation Services Pager:  [phone]  Office:  [phone]

## 2019-08-19 NOTE — Progress Notes (Signed)
PROGRESS NOTE    Jeffrey [name]  [id number] DOB: Aug 09, 1941 DOA: 08/14/2019 PCP: Scot Jun, FNP   Brief Narrative:    78 yo M WITH PMH  ESRD (M/T/T/Sa HD), CVA, CAD, CHF, s/p TAVR, A fib, DM II, chronic respiratory failure/COPD on 2 L home oxygen, left BKA who presented to ED 9/18 after noted by daughter to exhibit decreased level of consciousness. Daughter reported that the patient had several "episodes" of SOB and intermittent chest pain. During one such episode, he exhibited AMS, decreased level of consciousness and daughter dispatched EMS. Patient transported to ED, placed on NRB. Denied productive cough, rhinitis, wheezing, fever, chills, nightsweats. Chest pain was mild 3/10 intermittent without identifiable factors which improve or worsen pain.  Patient noted to be hyperkalemic 6.1 for which bicarb, insulin/dextrose, albuterol given.  He had not missed any dialysis session. Patient and daughter reference recent blood clot in fistula. Reportedly, this was resolved.  CT chest angiogram was ordered which ruled out PE.  It showed left-sided thyroid nodule measuring 2.7 cm. Patient's SBP while soft in ED, 80s-100. PCCM consulted for admission and nephrology was consulted and CRRT was started.   Assessment & Plan:   Active Problems:   Chronic combined systolic and diastolic CHF (congestive heart failure) (HCC)   Hypotension   Acute respiratory failure with hypoxia (HCC)   End-stage renal disease on hemodialysis (HCC)   Chronic hypotension   Acute systolic CHF (congestive heart failure) (HCC)   AF (paroxysmal atrial fibrillation) (HCC)   Complete heart block (HCC)   Presence of permanent cardiac pacemaker   S/P BKA (below knee amputation), left (HCC)   Diabetes mellitus type 2, uncontrolled, with complications (HCC)   Syncope and collapse   H/O aortic valve replacement   Thyroid nodule   Pressure injury of skin  Syncope and collapse - Currently asymptomatic.  This was  likely secondary to cardiac or vasovagal. - PE ruled out by CTA chest -   Acute on chronic hypoxic respiratory failure - in the setting of volume overload in ESRD; CT chest negative for PE -Back on 2 L of nasal collagenosis home oxygen.  Acute systolic CHF/paroxysmal atrial fibrillation/history of complete heart block/CAD - echo 9/19 with decline in LVEF since prior study (25-30%).  Prior echo last year showed 45% ejection fraction. follows with Dr. Caryl Comes. Eliquis 2.5 mg BID and Plavix 75 p.o. daily.  Cardiology was consulted on 08/18/2019.  Pacemaker was interrogated that showed atrial fibrillation with no other changes.  He remains in rate controlled atrial fibrillation.  Does not have acute exacerbation of CHF.  His CHF is likely due to being in atrial fibrillation.  Cardiology is thinking of possible cardioversion.  Aortic valve replacement -60mm Edwards Sapien bioprosthetic, stented aortic valve (TAVR) valve is present  Chronic hypotension - Remains on midodrine  Thyroid nodule - Thyroid nodule 2.7 cm.  Slightly elevated.  Will check T3-T4.  ESRD on HD T/TH/SAT? - Management per nephrology.  Appreciate their help.  Hyperkalemia Resolved.  Left BKA -Stable  Diabetes type 2 uncontrolled with complication - [id number] Hemoglobin A1c= 9.3 Controlled.  Continue SSI.  Goals of care - 9/22 palliative care consult: Perative care consulted on 08/18/2019.  DVT prophylaxis: Eliquis Code Status: Full Family Communication: None present at bedside. Disposition Plan: TBD   Consultants:  Nephrology PCCM Cardiology   Procedures/Significant Events:  9/18 CTA chest PE protocol:-Negative PE (within limits of study) -Dilated main pulmonary artery which can be seen in patients with elevated  pulmonary artery pressures. -Cardiomegaly with reflux of contrast into the IVC, suggesting right heart failure. -Left-sided thyroid nodule measuring approximately 2.7 cm. Further evaluation with a  nonemergent outpatient thyroid ultrasound is recommended. 9/19 echocardiogram;Left Ventricle: EF =25 to 30%. severely decreased function.- Moderately increased left ventricular size.  -Spectral Doppler shows  Indeterminate diastolic filling due to E-A fusion pattern of LV diastolic filling. -LV Wall Scoring: The basal and mid inferolateral wall and basal inferior segment are akinetic. The entire apex, entire anterior septum, mid and apical inferior wall, and basal inferoseptal segment are hypokinetic. All remaining scored segments are normal. -Right Ventricle:  moderately enlarged.-estimated right ventricular systolic pressure is moderately elevated at 54.7 mmHg. -Left Atrium: moderately dilated. -Right Atrium: moderately dilated -Mitral Valve:  Moderate mitral valve regurgitation. -Tricuspid Valve: regurgitation moderate by color flow Doppler. -Aortic Valve: Aortic valve has been repaired/replaced. 19mm Edwards Sapien bioprosthetic, stented aortic valve (TAVR) valve is present in the aortic position. 29 mm Edwards Sapien 3 (TAVR) in aortic position. -Venous: The inferior vena cava is dilated in size with less than 50% respiratory variability, suggesting right atrial pressure of 15 mmHg.  Subjective: Patient seen and examined in ICU.  He was sitting at the edge of the bed.  Did not have any complaint.  Denied any chest pain, palpitation or shortness of breath.  Was asking when he can go home.  Objective: Vitals:   08/19/19 0754 08/19/19 0800 08/19/19 0900 08/19/19 1000  BP:  (!) 83/59 (!) 96/51 (!) 83/64  Pulse:  (!) 42 (!) 58 (!) 55  Resp:  16 20 (!) 22  Temp: 98.2 F (36.8 C)     TempSrc: Oral     SpO2:  (!) 88% 97% 98%  Weight:      Height:        Intake/Output Summary (Last 24 hours) at 08/19/2019 1129 Last data filed at 08/19/2019 1000 Gross per 24 hour  Intake 855 ml  Output -  Net 855 ml   Filed Weights   08/18/19 0747 08/18/19 1116 08/19/19 0500  Weight: 98.9 kg 96.1  kg 96.2 kg   Physical Exam:  General exam: Appears calm and comfortable  Respiratory system: Fine crackles at the bases bilaterally. Respiratory effort normal. Cardiovascular system: S1 & S2 heard, RRR. No JVD, murmurs, rubs, gallops or clicks.  +1 pitting edema bilateral lower extremity. Gastrointestinal system: Abdomen is nondistended, soft and nontender. No organomegaly or masses felt. Normal bowel sounds heard. Central nervous system: Alert and oriented. No focal neurological deficits. Extremities: Symmetric 5 x 5 power. Skin: No rashes, lesions or ulcers.  Psychiatry: Judgement and insight appear normal. Mood & affect appropriate.   CBC: Recent Labs  Lab 08/14/19 2016  08/15/19 0425 08/16/19 0417 08/17/19 0727 08/18/19 0335 08/19/19 0530  WBC 6.9  --  7.1 8.2 10.1 8.5 7.0  NEUTROABS 4.7  --   --   --   --   --   --   HGB 11.9*   < > 11.4* 11.4* 11.5* 11.3* 11.1*  HCT 37.9*   < > 35.1* 34.7* 36.4* 34.3* 34.5*  MCV 98.4  --  95.1 94.0 94.3 93.5 95.3  PLT 172  --  185 187 217 169 164   < > = values in this interval not displayed.   Basic Metabolic Panel: Recent Labs  Lab 08/15/19 0144 08/15/19 0425 08/16/19 0417 08/17/19 0727 08/18/19 0335 08/19/19 0530  NA 135 136 133* 132* 132* 134*  K 5.8* 6.2* 4.6 4.6 3.6  3.4*  CL 95* 96* 94* 92* 94* 95*  CO2 23 24 23 22 27 27   GLUCOSE 146* 145* 156* 143* 225* 190*  BUN 38* 39* 22 33* 20 14  CREATININE 5.27* 5.34* 3.60* 4.48* 3.47* 2.92*  CALCIUM 9.0 9.2 9.0 9.2 8.6* 9.2  MG 1.8  --  1.8  --  1.8 1.9  PHOS 6.8*  --  5.4* 6.0*  --   --    GFR: Estimated Creatinine Clearance: 23.9 mL/min (A) (by C-G formula based on SCr of 2.92 mg/dL (H)). Liver Function Tests: Recent Labs  Lab 08/14/19 2016 08/15/19 0144 08/16/19 0417 08/17/19 0727  AST 33 36  --   --   ALT 20 22  --   --   ALKPHOS 135* 138*  --   --   BILITOT 2.0* 2.1*  --   --   PROT 6.4* 6.4*  --   --   ALBUMIN 3.0* 3.2* 3.0* 3.0*   No results for input(s):  LIPASE, AMYLASE in the last 168 hours. No results for input(s): AMMONIA in the last 168 hours. Coagulation Profile: No results for input(s): INR, PROTIME in the last 168 hours. Cardiac Enzymes: No results for input(s): CKTOTAL, CKMB, CKMBINDEX, TROPONINI in the last 168 hours. BNP (last 3 results) No results for input(s): PROBNP in the last 8760 hours. HbA1C: No results for input(s): HGBA1C in the last 72 hours. CBG: Recent Labs  Lab 08/17/19 2207 08/18/19 1151 08/18/19 1648 08/18/19 2202 08/19/19 0750  GLUCAP 197* 144* 205* 241* 186*   Lipid Profile: Recent Labs    08/18/19 1459  CHOL 60  HDL 17*  LDLCALC 26  TRIG 84  CHOLHDL 3.5   Thyroid Function Tests: Recent Labs    08/18/19 1459  TSH 5.976*   Anemia Panel: No results for input(s): VITAMINB12, FOLATE, FERRITIN, TIBC, IRON, RETICCTPCT in the last 72 hours. Urine analysis:    Component Value Date/Time   COLORURINE YELLOW 06/17/2017 [place] 06/17/2017 0547   LABSPEC 1.010 06/17/2017 0547   PHURINE 6.0 06/17/2017 0547   GLUCOSEU >=500 (A) 06/17/2017 0547   HGBUR SMALL (A) 06/17/2017 0547   BILIRUBINUR NEGATIVE 06/17/2017 0547   KETONESUR NEGATIVE 06/17/2017 0547   PROTEINUR 100 (A) 06/17/2017 0547   UROBILINOGEN 0.2 09/28/2014 2257   NITRITE NEGATIVE 06/17/2017 0547   LEUKOCYTESUR TRACE (A) 06/17/2017 0547   Sepsis Labs: @LABRCNTIP (procalcitonin:4,lacticidven:4)  ) Recent Results (from the past 240 hour(s))  Blood culture (routine x 2)     Status: None (Preliminary result)   Collection Time: 08/14/19  8:04 PM   Specimen: BLOOD RIGHT HAND  Result Value Ref Range Status   Specimen Description BLOOD RIGHT HAND  Final   Special Requests   Final    BOTTLES DRAWN AEROBIC AND ANAEROBIC Blood Culture results may not be optimal due to an inadequate volume of blood received in culture bottles   Culture   Final    NO GROWTH 4 DAYS Performed at [facility], [place] [address]  [place], [place] [address]    Report Status PENDING  Incomplete  SARS Coronavirus 2 [facility] order, Performed in [facility] hospital lab) Nasopharyngeal Nasopharyngeal Swab     Status: None   Collection Time: 08/14/19  8:16 PM   Specimen: Nasopharyngeal Swab  Result Value Ref Range Status   SARS Coronavirus 2 NEGATIVE NEGATIVE Final    Comment: (NOTE) If result is NEGATIVE SARS-CoV-2 target nucleic acids are NOT DETECTED. The SARS-CoV-2 RNA  is generally detectable in upper and lower  respiratory specimens during the acute phase of infection. The lowest  concentration of SARS-CoV-2 viral copies this assay can detect is 250  copies / mL. A negative result does not preclude SARS-CoV-2 infection  and should not be used as the sole basis for treatment or other  patient management decisions.  A negative result may occur with  improper specimen collection / handling, submission of specimen other  than nasopharyngeal swab, presence of viral mutation(s) within the  areas targeted by this assay, and inadequate number of viral copies  (<250 copies / mL). A negative result must be combined with clinical  observations, patient history, and epidemiological information. If result is POSITIVE SARS-CoV-2 target nucleic acids are DETECTED. The SARS-CoV-2 RNA is generally detectable in upper and lower  respiratory specimens dur ing the acute phase of infection.  Positive  results are indicative of active infection with SARS-CoV-2.  Clinical  correlation with patient history and other diagnostic information is  necessary to determine patient infection status.  Positive results do  not rule out bacterial infection or co-infection with other viruses. If result is PRESUMPTIVE POSTIVE SARS-CoV-2 nucleic acids MAY BE PRESENT.   A presumptive positive result was obtained on the submitted specimen  and confirmed on repeat testing.  While 2019 novel coronavirus  (SARS-CoV-2) nucleic acids may be present in the  submitted sample  additional confirmatory testing may be necessary for epidemiological  and / or clinical management purposes  to differentiate between  SARS-CoV-2 and other Sarbecovirus currently known to infect humans.  If clinically indicated additional testing with an alternate test  methodology [phone]) is advised. The SARS-CoV-2 RNA is generally  detectable in upper and lower respiratory sp ecimens during the acute  phase of infection. The expected result is Negative. Fact Sheet for Patients:  StrictlyIdeas.no Fact Sheet for Healthcare Providers: BankingDealers.co.za This test is not yet approved or cleared by the [place] FDA and has been authorized for detection and/or diagnosis of SARS-CoV-2 by FDA under an Emergency Use Authorization (EUA).  This EUA will remain in effect (meaning this test can be used) for the duration of the COVID-19 declaration under Section 564(b)(1) of the Act, 21 U.S.C. section 360bbb-3(b)(1), unless the authorization is terminated or revoked sooner. Performed at [facility], [place] [address], [place], [place] [address]   Blood culture (routine x 2)     Status: None (Preliminary result)   Collection Time: 08/15/19  4:25 AM   Specimen: BLOOD  Result Value Ref Range Status   Specimen Description BLOOD RIGHT ANTECUBITAL  Final   Special Requests   Final    BOTTLES DRAWN AEROBIC AND ANAEROBIC Blood Culture adequate volume   Culture   Final    NO GROWTH 3 DAYS Performed at [facility], [place] [address], [place], [place] [address]    Report Status PENDING  Incomplete  MRSA PCR Screening     Status: None   Collection Time: 08/15/19  5:00 AM   Specimen: Nasopharyngeal  Result Value Ref Range Status   MRSA by PCR NEGATIVE NEGATIVE Final    Comment:        The GeneXpert MRSA Assay (FDA approved for NASAL specimens only), is one component of a comprehensive MRSA colonization surveillance  program. It is not intended to diagnose MRSA infection nor to guide or monitor treatment for MRSA infections. Performed at [facility], [place] [address], [place], [place] [address]  Radiology Studies: No results found.      Scheduled Meds: . apixaban  2.5 mg Oral BID  . atorvastatin  80 mg Oral q1800  . Chlorhexidine Gluconate Cloth  6 each Topical Q0600  . Chlorhexidine Gluconate Cloth  6 each Topical Q0600  . clopidogrel  75 mg Oral Daily  . doxercalciferol  1 mcg Intravenous Q M,W,F-HD  . fluticasone furoate-vilanterol  1 puff Inhalation Daily  . insulin aspart  0-5 Units Subcutaneous QHS  . insulin aspart  0-9 Units Subcutaneous TID WC  . mouth rinse  15 mL Mouth Rinse BID  . midodrine  10 mg Oral TID WC  . pantoprazole  40 mg Oral Daily  . pneumococcal 23 valent vaccine  0.5 mL Intramuscular Tomorrow-1000  . sevelamer carbonate  1,600 mg Oral TID WC   Continuous Infusions:   LOS: 4 days   Darliss Cheney, MD [facility]  If 7PM-7AM, please contact night-coverage [email or web address] Password [facility] 08/19/2019, 11:29 AM

## 2019-08-19 NOTE — Progress Notes (Signed)
Inpatient Diabetes Program Recommendations  AACE/ADA: New Consensus Statement on Inpatient Glycemic Control   Target Ranges:  Prepandial:   less than 140 mg/dL      Peak postprandial:   less than 180 mg/dL (1-2 hours)      Critically ill patients:  140 - 180 mg/dL   Results for Jeffrey [name], Jeffrey [name] (MRN [id number]) as of 08/19/2019 10:13  Ref. Range 08/18/2019 11:51 08/18/2019 16:48 08/18/2019 22:02 08/19/2019 07:50  Glucose-Capillary Latest Ref Range: 70 - 99 mg/dL 144 (H) 205 (H) 241 (H) 186 (H)   Review of Glycemic Control  Diabetes history: DM2 Outpatient Diabetes medications: Levemir 18 units QHS Current orders for Inpatient glycemic control: Novolog 0-9 units TID with meals, Novolog 0-5 units QHS  Inpatient Diabetes Program Recommendations:   Insulin-Meal Coverage: Please consider ordering Novolog 2 units TID with meals for meal coverage if patient eats at least 50% of meals.  Thanks, Barnie Alderman, RN, MSN, CDE Diabetes Coordinator Inpatient Diabetes Program [phone] (Team Pager from 8am to 5pm)

## 2019-08-19 NOTE — Progress Notes (Signed)
DAILY PROGRESS NOTE   Patient Name: Jeffrey [name] Date of Encounter: 08/19/2019 Cardiologist: [name], MD  Chief Complaint   Breathing better  Patient Profile   Jeffrey [name] is a 78 y.o. male with a history of multivessel CAD s/p PTCA/orbital atherectomy/DES of mid LAD and ostium circumflex and chronically occluded RCA on cath in 03/2018 with in-stent restenosis of ostium circumflex in 07/2018 requiring balloon/PTCA, severe aortic stenosis s/p TAVR in 2016, PAD s/p left below knee amputation in 03/2019 for nonhealing left foot wound, chronic combined CHF, persistent atrial fibrillation on Eliquis, complete heart blocker s/p PPM, ESRD on MWF, prior CVA, hypertension, hyperlipidemia, type 2 diabetes mellitus, who is being seen today for the evaluation of reduced EF at the request of Dr. Sherral Hammers.  Subjective   Noted to have PVC's overnight. He has diuresed 1.4L negative- now 2.4L total negative. Pacemaker interrogation is in progress. Will have EP evaluate decline in LVEF, ?related to PPM - is he having sustained VT as cause of presyncope/syncope? Creatinine improved today from 3.47 to 2.92.   Objective   Vitals:   08/19/19 0411 08/19/19 0500 08/19/19 0600 08/19/19 0754  BP:  (!) 84/48 (!) 90/54   Pulse:  (!) 55 (!) 54   Resp:  (!) 24 13   Temp: 97.9 F (36.6 C)   98.2 F (36.8 C)  TempSrc: Oral   Oral  SpO2:  97% 100%   Weight:  96.2 kg    Height:        Intake/Output Summary (Last 24 hours) at 08/19/2019 [id number] Last data filed at 08/18/2019 2000 Gross per 24 hour  Intake 615 ml  Output 2052 ml  Net -1437 ml   Filed Weights   08/18/19 0747 08/18/19 1116 08/19/19 0500  Weight: 98.9 kg 96.1 kg 96.2 kg    Physical Exam   General appearance: alert and no distress Neck: JVD - 3 cm above sternal notch, no carotid bruit and thyroid not enlarged, symmetric, no tenderness/mass/nodules Lungs: diminished breath sounds bilaterally Heart: regular rate and rhythm Abdomen:  soft, non-tender; bowel sounds normal; no masses,  no organomegaly Extremities: 1+ RLE edema, 2 venous ulcers, s/p L BKA Pulses: 1+ RLE pulse Skin: Skin color, texture, turgor normal. No rashes or lesions Neurologic: Grossly normal Psych: Pleasant  Inpatient Medications    Scheduled Meds: . apixaban  2.5 mg Oral BID  . atorvastatin  80 mg Oral q1800  . Chlorhexidine Gluconate Cloth  6 each Topical Q0600  . clopidogrel  75 mg Oral Daily  . doxercalciferol  1 mcg Intravenous Q M,W,F-HD  . fluticasone furoate-vilanterol  1 puff Inhalation Daily  . insulin aspart  0-5 Units Subcutaneous QHS  . insulin aspart  0-9 Units Subcutaneous TID WC  . mouth rinse  15 mL Mouth Rinse BID  . midodrine  10 mg Oral TID WC  . pantoprazole  40 mg Oral Daily  . pneumococcal 23 valent vaccine  0.5 mL Intramuscular Tomorrow-1000  . sevelamer carbonate  1,600 mg Oral TID WC    Continuous Infusions:   PRN Meds: acetaminophen, albuterol, hydrOXYzine   Labs   Results for orders placed or performed during the hospital encounter of 08/14/19 (from the past 48 hour(s))  Glucose, capillary     Status: Abnormal   Collection Time: 08/17/19 12:03 PM  Result Value Ref Range   Glucose-Capillary 101 (H) 70 - 99 mg/dL  Glucose, capillary     Status: Abnormal   Collection Time: 08/17/19  4:22  PM  Result Value Ref Range   Glucose-Capillary 190 (H) 70 - 99 mg/dL  Glucose, capillary     Status: Abnormal   Collection Time: 08/17/19 10:07 PM  Result Value Ref Range   Glucose-Capillary 197 (H) 70 - 99 mg/dL  Basic metabolic panel     Status: Abnormal   Collection Time: 08/18/19  3:35 AM  Result Value Ref Range   Sodium 132 (L) 135 - 145 mmol/L   Potassium 3.6 3.5 - 5.1 mmol/L    Comment: NO VISIBLE HEMOLYSIS   Chloride 94 (L) 98 - 111 mmol/L   CO2 27 22 - 32 mmol/L   Glucose, Bld 225 (H) 70 - 99 mg/dL   BUN 20 8 - 23 mg/dL   Creatinine, Ser 3.47 (H) 0.61 - 1.24 mg/dL   Calcium 8.6 (L) 8.9 - 10.3 mg/dL    GFR calc non Af Amer 16 (L) >60 mL/min   GFR calc Af Amer 18 (L) >60 mL/min   Anion gap 11 5 - 15    Comment: Performed at [facility], [place] [address], [place], [place] [address]  Magnesium     Status: None   Collection Time: 08/18/19  3:35 AM  Result Value Ref Range   Magnesium 1.8 1.7 - 2.4 mg/dL    Comment: Performed at [facility] [address] [place], [place] [address]  CBC     Status: Abnormal   Collection Time: 08/18/19  3:35 AM  Result Value Ref Range   WBC 8.5 4.0 - 10.5 K/uL   RBC 3.67 (L) 4.22 - 5.81 MIL/uL   Hemoglobin 11.3 (L) 13.0 - 17.0 g/dL   HCT 34.3 (L) 39.0 - 52.0 %   MCV 93.5 80.0 - 100.0 fL   MCH 30.8 26.0 - 34.0 pg   MCHC 32.9 30.0 - 36.0 g/dL   RDW 16.7 (H) 11.5 - 15.5 %   Platelets 169 150 - 400 K/uL   nRBC 0.0 0.0 - 0.2 %    Comment: Performed at [facility], [place] [address] [place], [place] [address]  Glucose, capillary     Status: Abnormal   Collection Time: 08/18/19 11:51 AM  Result Value Ref Range   Glucose-Capillary 144 (H) 70 - 99 mg/dL  Lipid panel     Status: Abnormal   Collection Time: 08/18/19  2:59 PM  Result Value Ref Range   Cholesterol 60 0 - 200 mg/dL   Triglycerides 84 <150 mg/dL   HDL 17 (L) >40 mg/dL   Total CHOL/HDL Ratio 3.5 RATIO   VLDL 17 0 - 40 mg/dL   LDL Cholesterol 26 0 - 99 mg/dL    Comment:        Total Cholesterol/HDL:CHD Risk Coronary Heart Disease Risk Table                     Men   Women  1/2 Average Risk   3.4   3.3  Average Risk       5.0   4.4  2 X Average Risk   9.6   7.1  3 X Average Risk  23.4   11.0        Use the calculated Patient Ratio above and the CHD Risk Table to determine the patient's CHD Risk.        ATP III CLASSIFICATION (LDL):  <100     mg/dL   Optimal  100-129  mg/dL   Near or Above  Optimal  130-159  mg/dL   Borderline  160-189  mg/dL   High  >190     mg/dL   Very High Performed at [facility] [address], [place], [place]  [address]   TSH     Status: Abnormal   Collection Time: 08/18/19  2:59 PM  Result Value Ref Range   TSH 5.976 (H) 0.350 - 4.500 uIU/mL    Comment: Performed by a 3rd Generation assay with a functional sensitivity of <=0.01 uIU/mL. Performed at [facility], [place] [address] [place], [place] [address]   Glucose, capillary     Status: Abnormal   Collection Time: 08/18/19  4:48 PM  Result Value Ref Range   Glucose-Capillary 205 (H) 70 - 99 mg/dL  Glucose, capillary     Status: Abnormal   Collection Time: 08/18/19 10:02 PM  Result Value Ref Range   Glucose-Capillary 241 (H) 70 - 99 mg/dL   Comment 1 Notify RN   Basic metabolic panel     Status: Abnormal   Collection Time: 08/19/19  5:30 AM  Result Value Ref Range   Sodium 134 (L) 135 - 145 mmol/L   Potassium 3.4 (L) 3.5 - 5.1 mmol/L   Chloride 95 (L) 98 - 111 mmol/L   CO2 27 22 - 32 mmol/L   Glucose, Bld 190 (H) 70 - 99 mg/dL   BUN 14 8 - 23 mg/dL   Creatinine, Ser 2.92 (H) 0.61 - 1.24 mg/dL   Calcium 9.2 8.9 - 10.3 mg/dL   GFR calc non Af Amer 19 (L) >60 mL/min   GFR calc Af Amer 22 (L) >60 mL/min   Anion gap 12 5 - 15    Comment: Performed at [facility] [address] [place], [place] [address]  Magnesium     Status: None   Collection Time: 08/19/19  5:30 AM  Result Value Ref Range   Magnesium 1.9 1.7 - 2.4 mg/dL    Comment: Performed at [facility] [address] [place], [place] [address]  CBC     Status: Abnormal   Collection Time: 08/19/19  5:30 AM  Result Value Ref Range   WBC 7.0 4.0 - 10.5 K/uL   RBC 3.62 (L) 4.22 - 5.81 MIL/uL   Hemoglobin 11.1 (L) 13.0 - 17.0 g/dL   HCT 34.5 (L) 39.0 - 52.0 %   MCV 95.3 80.0 - 100.0 fL   MCH 30.7 26.0 - 34.0 pg   MCHC 32.2 30.0 - 36.0 g/dL   RDW 17.0 (H) 11.5 - 15.5 %   Platelets 164 150 - 400 K/uL   nRBC 0.0 0.0 - 0.2 %    Comment: Performed at [facility], [place] [address] [place], [place] [address]  Glucose, capillary     Status: Abnormal    Collection Time: 08/19/19  7:50 AM  Result Value Ref Range   Glucose-Capillary 186 (H) 70 - 99 mg/dL    ECG   N/A  Telemetry   V-paced rhythm with PVC's - Personally Reviewed  Radiology    No results found.  Cardiac Studies   N/A  Assessment   1. Active Problems: 2.   Chronic combined systolic and diastolic CHF (congestive heart failure) ([place]) 3.   Hypotension 4.   Acute respiratory failure with hypoxia ([facility]) 5.   End-stage renal disease on hemodialysis ([place]) 6.   Chronic hypotension 7.   Acute systolic CHF (congestive heart failure) ([place]) 8.  AF (paroxysmal atrial fibrillation) ([place]) 9.   Complete heart block ([place]) 10.   Presence of permanent cardiac pacemaker 11.   S/P BKA (below knee amputation), left ([facility]) 12.   Diabetes mellitus type 2, uncontrolled, with complications ([place]) 13.   Syncope and collapse 14.   H/O aortic valve replacement 15.   Thyroid nodule 16.   Plan   1. Appreciate pacer interrogation today- he is 100% afib since last check on 08/06/2018 - has been anticoagulated on Eliquis consistently. No high V rates or NSVT episodes associated with syncope, therefore, suspect it was orthostatic. His worsening cardiomyopathy may be related to afib- he would likely have improved cardiac output if we could establish sinus rhythm. I think we should pursue cardioversion. Will ask EP to see for recommendations.   Time Spent Directly with Patient:  I have spent a total of 35 minutes with the patient reviewing hospital notes, telemetry, EKGs, labs and examining the patient as well as establishing an assessment and plan that was discussed personally with the patient.  > 50% of time was spent in direct patient care.  Length of Stay:  LOS: 4 days   Pixie Casino, MD, [facility], [place] Director of the [facility] Lipid Disorders &  Cardiovascular Risk Reduction Clinic Diplomate of the American Board of Clinical Lipidology Attending  Cardiologist  Direct Dial: [phone]  Fax: [phone]  Website:  [email or web address]  Nadean Corwin Hilty 08/19/2019, 9:25 AM

## 2019-08-19 NOTE — Progress Notes (Signed)
CSW received notification from [facility] and [facility] that they are not able to accept this patient to provide home health services. CSW will continue to attempt to secure [facility] services prior to patient discharging.  Madilyn Fireman, MSW, LCSW-A Clinical Social Worker   Transitions of [facility]   Medical ICU [phone]

## 2019-08-19 NOTE — Progress Notes (Signed)
Pt transfer: pt is alert and oriented to self and place. He denies any pain at the moment, on 2L of O2, respirations even and unlabored. Pt oriented to room and staff. Call light within reach and wife is at the bedside.   MD Pahwani notified via secure chat that pt was on enteric precautions and that there was not stool result or collection in chart. Per MD, to DC the enteric precautions.    Paulla Fore, RN

## 2019-08-19 NOTE — Consult Note (Signed)
Consultation Note Date: 08/19/2019   Patient Name: Jeffrey [name]  DOB: 1941/07/14  MRN: [id number]  Age / Sex: 78 y.o., male  PCP: Scot Jun, FNP Referring Physician: Darliss Cheney, MD  Reason for Consultation: Establishing goals of care  HPI/Patient Profile: 78 y.o. male  with past medical history of ESRD on HD, COPD on home O2 at 2L, CVA, CAD, CHF (most recent ECHO 25-35% ECHO), DM II with L BKA, admitted on 08/14/2019 after having syncope at home after bowel movement per his and spouses report. He has been found during this admission to have orthostatic hypotension, worsened EF. Pacemaker interrogation has shown him to be in a fib for over a year with cardiomyopathy.    EP consulted- do not feel he is good candidate for cardioversion or ICD. Palliative medicine consulted for [place] and code status.   Clinical Assessment and Goals of Care:  I have reviewed medical records including EPIC notes, labs and imaging, assessed the patient and then met at the bedside along with the patient and his spouse to discuss diagnosis prognosis, GOC, EOL wishes, disposition and options.  I introduced Palliative Medicine as specialized medical care for people living with serious illness. It focuses on providing relief from the symptoms and stress of a serious illness. The goal is to improve quality of life for both the patient and the family.  We discussed a brief life review of the patient. He is retired from Architect and truck driving. He is married to his spouse for over 42 years. They have several children.   As far as functional and nutritional status - he requires hoyer lift at home for transfers from bed to chair. Requires assistance for all care. He has good appetite.   We discussed his current illness and what it means in the larger context of his on-going co-morbidities.  Natural disease trajectory and  expectations at EOL were discussed. Wisam states he would rather spend more time at home than in the hospital. We reviewed the overall trajectory of heart failure and how it is progressively worsened by ESRD. We also discussed his tolerance for HD. He notes that sometimes he has to sign off early due to having leg cramps, and becoming SOB. We discussed this cycle of signing off early- worsening his heart- which causes him to not tolerate dialysis making him feel the need to sign off early.  I attempted to elicit values and goals of care important to the patient. Spending time with his family is most important. He has not seen his son who lives in [place] in a long while.    The difference between aggressive medical intervention and comfort care was explained in light of the patient's goals of care.   Advanced directives, concepts specific to code status, artifical feeding and hydration, and rehospitalization were considered and discussed.   Hospice and Palliative Care services outpatient were explained and offered.  Questions and concerns were addressed.  Hard Choices booklet left for review. The family was encouraged to call  with questions or concerns.   Primary Decision Maker PATIENT    SUMMARY OF RECOMMENDATIONS -GOC- patient wishes to continue HD and other life prolonging measures for now including rehospitalization- however, he and spouse plan to discuss with his children his illnesses and comorbidities- I encouraged him to consider at what point he feels that medical interventions (including HD) are improving his quality of life- and at what point these interventions are possibly prolonging suffering -Advanced directives- this is his first discussion regarding Advanced Directives or consideration of code status- he states he needs to discuss this with his spouse and his children- he notes that his children do not know how sick he actually is -Code status- patient is considering change of  code status but wishes to discuss this with his children- Hard Choices book was left for his review  -He agrees to outpatient Palliative Medicine f/u at home upon discharge -PMT provider will followup with him on Thursday if he is still admitted  Prognosis:    Unable to determine  Discharge Planning: To Be Determined  Primary Diagnoses: Present on Admission: . Acute respiratory failure with hypoxia ([place]) . Chronic hypotension . Acute systolic CHF (congestive heart failure) ([place]) . AF (paroxysmal atrial fibrillation) ([place]) . Complete heart block ([place]) . Presence of permanent cardiac pacemaker . Diabetes mellitus type 2, uncontrolled, with complications ([place]) . Chronic combined systolic and diastolic CHF (congestive heart failure) ([place]) . Syncope and collapse . Hypotension . Thyroid nodule   I have reviewed the medical record, interviewed the patient and family, and examined the patient. The following aspects are pertinent.  Past Medical History:  Diagnosis Date  . Anginal pain ([place])   . Aortic stenosis    a. severe by echo 09/2014  . Atrial fibrillation ([place])    a. not well documented, not on anticoagulation  . CHF (congestive heart failure) ([place])    04/28/17 echo-EF 40%, mod LVH, diastolic dysfunction  . Claustrophobia   . Complete heart block ([place])   . COPD (chronic obstructive pulmonary disease) ([place])   . Coronary artery disease    a. chronically occluded RCA per cath 09/2014 with collaterals B. cath 05/01/17 chr occ RCA w/collaterals, 60-70% mid LAD,   . CVA (cerebral vascular accident) ([place]) 10/2014   denies residual on 07/11/2015  . ESRD (end stage renal disease) on dialysis [facility])    a. on dialysis; [facility]; MWF, LUE fistula (07/11/2015)  . History of blood transfusion    "related to gallbladder OR"  . History of stomach ulcers   . Hyperlipidemia   . Hypertension   . Iron deficiency anemia   . Myocardial infarction ([place]) 10/2014  . Peripheral vascular  disease ([place])   . Pneumonia   . Presence of permanent cardiac pacemaker   . 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 Type II diabetes mellitus ([place])    Social History   Socioeconomic History  . Marital status: Married    Spouse name: Not on file  . Number of children: 3  . Years of education: Not on file  . Highest education level: Not on file  Occupational History  . Occupation: retired  Scientific laboratory technician  . Financial resource strain: Not on file  . Food insecurity    Worry: Not on file    Inability: Not on file  . Transportation needs    Medical: Not on file  Non-medical: Not on file  Tobacco Use  . Smoking status: Former Smoker    Packs/day: 2.00    Years: 32.00    Pack years: 64.00    Quit date: 11/27/1983    Years since quitting: 35.7  . Smokeless tobacco: Never Used  Substance and Sexual Activity  . Alcohol use: Yes    Comment: rarely  . Drug use: No  . Sexual activity: Yes    Birth control/protection: None  Lifestyle  . Physical activity    Days per week: Not on file    Minutes per session: Not on file  . Stress: Not on file  Relationships  . Social Herbalist on phone: Not on file    Gets together: Not on file    Attends religious service: Not on file    Active member of club or organization: Not on file    Attends meetings of clubs or organizations: Not on file    Relationship status: Not on file  Other Topics Concern  . Not on file  Social History Narrative  . Not on file   Family History  Problem Relation Age of Onset  . Diabetes Father   . Heart disease Father   . Diabetes Sister   . Alzheimer's disease Mother   . Stroke Mother   . Diabetes Paternal Grandmother   . Alzheimer's disease Maternal Aunt        x 8 Maternal Aunts  . Cancer Maternal Uncle        type unknown   Scheduled Meds: . apixaban  2.5 mg Oral BID  .  atorvastatin  80 mg Oral q1800  . Chlorhexidine Gluconate Cloth  6 each Topical Q0600  . Chlorhexidine Gluconate Cloth  6 each Topical Q0600  . clopidogrel  75 mg Oral Daily  . doxercalciferol  1 mcg Intravenous Q M,W,F-HD  . fluticasone furoate-vilanterol  1 puff Inhalation Daily  . insulin aspart  0-5 Units Subcutaneous QHS  . insulin aspart  0-9 Units Subcutaneous TID WC  . mouth rinse  15 mL Mouth Rinse BID  . midodrine  10 mg Oral TID WC  . pantoprazole  40 mg Oral Daily  . pneumococcal 23 valent vaccine  0.5 mL Intramuscular Tomorrow-1000  . sevelamer carbonate  1,600 mg Oral TID WC   Continuous Infusions: PRN Meds:.acetaminophen, albuterol, hydrOXYzine Medications Prior to Admission:  Prior to Admission medications   Medication Sig Start Date End Date Taking? Authorizing Provider  acetaminophen (TYLENOL) 500 MG tablet Take 1 tablet (500 mg total) by mouth every 6 (six) hours as needed for mild pain or headache (pain). Patient taking differently: Take 1,000 mg by mouth every 6 (six) hours as needed for headache (pain).  04/18/19  Yes Pokhrel, Laxman, MD  albuterol (PROVENTIL HFA;VENTOLIN HFA) 108 (90 Base) MCG/ACT inhaler Inhale 2 puffs into the lungs every 4 (four) hours as needed for wheezing or shortness of breath. 11/27/18  Yes Tanda Rockers, MD  albuterol (PROVENTIL) (2.5 MG/3ML) 0.083% nebulizer solution Take 3 mLs (2.5 mg total) by nebulization every 6 (six) hours as needed for wheezing or shortness of breath. 01/07/19  Yes Scot Jun, FNP  apixaban (ELIQUIS) 2.5 MG TABS tablet Take 1 tablet (2.5 mg total) by mouth 2 (two) times daily. 04/29/19  Yes Deboraha Sprang, MD  atorvastatin (LIPITOR) 80 MG tablet Take 1 tablet (80 mg total) by mouth daily at 6 PM. 01/07/19  Yes Scot Jun, FNP  B Complex-C-Zn-Folic  Acid (DIALYVITE 800 WITH ZINC) 0.8 MG TABS Take 1 tablet by mouth daily. 05/13/19  Yes [provider]  budesonide-formoterol (SYMBICORT) 160-4.5 MCG/ACT  inhaler Inhale 2 puffs into the lungs 2 (two) times daily as needed.   Yes [provider]  cinacalcet (SENSIPAR) 30 MG tablet Take 30 mg by mouth See admin instructions. Take one tablet (30 mg) by mouth on Monday, Tuesday, Thursday, Saturday after dialysis   Yes [provider]  clopidogrel (PLAVIX) 75 MG tablet Take 1 tablet (75 mg total) by mouth daily. 04/29/19  Yes Deboraha Sprang, MD  doxercalciferol (HECTOROL) 4 MCG/2ML injection Inject 0.5 mLs (1 mcg total) into the vein Every Tuesday,Thursday,and Saturday with dialysis. 02/26/19  Yes Norval Morton, MD  hydrOXYzine (ATARAX/VISTARIL) 25 MG tablet Take 1 tablet (25 mg total) by mouth every 8 (eight) hours as needed for anxiety. 04/22/19  Yes Scot Jun, FNP  Insulin Pen Needle 31G X 6 MM MISC at bedtime.  08/28/18  Yes [provider]  LEVEMIR FLEXTOUCH 100 UNIT/ML Pen INJECT 48 UNITS TOTAL INTO THE SKIN AT BEDTIME. Patient taking differently: Inject 18 Units into the skin at bedtime.  07/17/19  Yes Charlott Rakes, MD  lidocaine-prilocaine (EMLA) cream Apply 1 application topically as needed (dialysis days).   Yes [provider]  loperamide (IMODIUM) 2 MG capsule Take 2 mg by mouth as needed. 02/26/19 02/25/20 Yes [provider]  loratadine (CLARITIN) 10 MG tablet Take 10 mg by mouth daily.    Yes [provider]  Melatonin 5 MG TABS Take 5-10 mg by mouth at bedtime as needed (sleep).    Yes [provider]  metoprolol tartrate (LOPRESSOR) 25 MG tablet Take 0.5 tablets (12.5 mg total) by mouth See admin instructions. Take 1/2 tablet (12.5 mg) by mouth on Sunday, Monday, Wednesday, Friday mornings (non-dialysis days), take 1/2 tablet (12.5 mg) every evening Patient taking differently: Take 12.5 mg by mouth See admin instructions. Take 1/2 tablet (12.5 mg) by mouth on Sunday,, Wednesday, Friday mornings (non-dialysis days), take 1/2 tablet (12.5 mg) every evening 04/29/19  Yes Deboraha Sprang, MD  midodrine (PROAMATINE) 10 MG tablet Take 10 mg by mouth See admin instructions. Take one tablet (10 mg) by mouth 30 minutes before dialysis on Monday, Tuesday, Thursday, Saturday 02/05/19  Yes [provider]  midodrine (PROAMATINE) 2.5 MG tablet Take 1 tablet (2.5 mg total) by mouth 3 (three) times daily with meals. Patient should continue to take 10 mg in the morning prior to hemodialysis sessions. Patient taking differently: Take 2.5 mg by mouth See admin instructions. Take one tablet (2.5 mg) by mouth three times daily on Sunday, Wednesday and Friday; take one tablet (2.5 mg) twice daily on Monday, Tuesday, Thursday, Saturday (after dialysis and in the evening) . Patient should continue to take 10 mg tablet in the morning prior to hemodialysis sessions. 04/24/19  Yes Scot Jun, FNP  mirtazapine (REMERON) 15 MG tablet Take 7.5 mg by mouth at bedtime. For appetite/sleep 06/04/19  Yes [provider]  nitroGLYCERIN (NITROSTAT) 0.4 MG SL tablet Place 1 tablet (0.4 mg total) under the tongue every 5 (five) minutes as needed for chest pain. 04/19/18  Yes Rai, Ripudeep K, MD  oxyCODONE-acetaminophen (PERCOCET/ROXICET) 5-325 MG tablet Take 1-2 tablets by mouth every 6 (six) hours as needed for severe pain. 2/99/24  Yes Delora Fuel, MD  OXYGEN Inhale 3 L into the lungs continuous. 2-3 lpm 24/7    Yes [provider]  pantoprazole (PROTONIX) 40 MG tablet Take 1 tablet (40 mg total) by mouth daily. 04/29/19  Yes Deboraha Sprang, MD  sevelamer carbonate (RENVELA) 800 MG tablet Take 1,600-2,400 mg by mouth See admin instructions. Take 3 tablets (2400 mg) by mouth with meals and 2 tablets (1600 mg) with snacks   Yes [provider]  vancomycin (VANCOCIN) 125 MG capsule Take 125 mg by mouth every 6 (six) hours.   Yes [provider]  ALPRAZolam (XANAX) 0.25 MG tablet Take 1 tablet (0.25 mg total) by mouth 2 (two) times daily as needed for anxiety.  Patient not taking: Reported on 08/15/2019 02/24/19   Norval Morton, MD  insulin detemir (LEVEMIR) 100 UNIT/ML injection Inject 0.2 mLs (20 Units total) into the skin at bedtime. Patient not taking: Reported on 08/15/2019 04/18/19   Pokhrel, Corrie Mckusick, MD  polyethylene glycol powder (GLYCOLAX/MIRALAX) 17 GM/SCOOP powder Take 17 g by mouth 2 (two) times daily. Patient not taking: Reported on 08/15/2019 04/19/19   Montine Circle, PA-C   Allergies  Allergen Reactions  . Byetta 10 Mcg Pen [Exenatide] Diarrhea and Nausea And Vomiting  . Telmisartan Other (See Comments)    Unknown reaction - reported by [facility]  . Codeine Itching    Minor reaction  . Coumadin [Warfarin Sodium] Rash    (wife states coumadin was stopped but rash did not disappear 02/21/19)   Review of Systems  Constitutional: Positive for activity change and fatigue.  Respiratory: Positive for shortness of breath.   Skin: Positive for wound.    Physical Exam Vitals signs and nursing note reviewed.  Constitutional:      General: He is not in acute distress.    Appearance: He is normal weight. He is not ill-appearing.  Cardiovascular:     Rate and Rhythm: Normal rate. Rhythm irregular.  Pulmonary:     Effort: Pulmonary effort is normal.  Neurological:     Mental Status: He is alert.     Motor: Weakness present.  Psychiatric:        Mood and Affect: Mood normal.        Thought Content: Thought content normal.     Vital Signs: BP (!) 112/59 (BP Location: Right Arm)   Pulse (!) 55   Temp (!) 97.1 F (36.2 C) (Axillary)   Resp (!) 27   Ht '5\' 11"'  (1.803 m)   Wt 96.2 kg   SpO2 98%   BMI 29.58 kg/m  Pain Scale: 0-10   Pain Score: 0-No pain   SpO2: SpO2: 98 % O2 Device:SpO2: 98 % O2 Flow Rate: .O2 Flow Rate (L/min): 2 L/min  IO: Intake/output summary:   Intake/Output Summary (Last 24 hours) at 08/19/2019 1508 Last data filed at 08/19/2019 1000 Gross per 24 hour  Intake 480 ml  Output -  Net 480 ml     LBM: Last BM Date: 08/18/19 Baseline Weight: Weight: 95.3 kg Most recent weight: Weight: 96.2 kg     Palliative Assessment/Data: PPS: 40%     Thank you for this consult. Palliative medicine will continue to follow and assist as needed.   Time In: 1330 Time Out: 1545 Time Total: 75 mins Greater than 50%  of this time was spent counseling and coordinating care related to the above assessment and plan.  Signed by: Mariana Kaufman, AGNP-C Palliative Medicine    Please contact Palliative Medicine Team phone at [phone] for questions and concerns.  For individual provider: See Shea Evans

## 2019-08-19 NOTE — Progress Notes (Signed)
PT has daily dressing change orders for LBKA. Wife is in the room and complains that we are not doing a good job of changing the dressing and states that wound looks worse and that she wants to change it herself. PT agrees with spouse and refused dressing change by RN.

## 2019-08-19 NOTE — Progress Notes (Signed)
[facility] NEPHROLOGY PROGRESS NOTE  Assessment/ Plan: Pt is a 78 y.o. yo male ESRD on HD, AS s/p TAVR, left BKA, chronic hypertension admitted with hypoxia and chest pain.  Dialysis: [facility] MTTS   95.5kg   F200  2/2.25 bath  Hep none  LUE AVF    Hectorol 1 mg q rx   Venofer 50 mg q week   No ESA  #SOB/hypoxia due to volume overload.  This is a recurrent problem complicated by hypotension. Receiving midodrine before and during dialysis.  Looks much better now.  # ESRD: doing MTTS schedule. HD yesterday with 2 L UF.  Plan for next dialysis tomorrow.  # Anemia: Hemoglobin acceptable.  No ESA.  # Secondary hyperparathyroidism: resume Hectorol and binders.  Add phosphorus level  #Hypotension/volume: Continue midodrine.  Has chronic low blood pressure.  #Dialysis Access: Recent fistulogram.  #PAD status post left BKA   Subjective: Seen and examined in ICU.  He feels good.  Denies nausea, vomiting, chest pain, shortness of breath.  Plan for dialysis tomorrow.  Discussed with bedside nurse.  Objective Vital signs in last 24 hours: Vitals:   08/19/19 0411 08/19/19 0500 08/19/19 0600 08/19/19 0754  BP:  (!) 84/48 (!) 90/54   Pulse:  (!) 55 (!) 54   Resp:  (!) 24 13   Temp: 97.9 F (36.6 C)   98.2 F (36.8 C)  TempSrc: Oral   Oral  SpO2:  97% 100%   Weight:  96.2 kg    Height:       Weight change: 2.6 kg  Intake/Output Summary (Last 24 hours) at 08/19/2019 0934 Last data filed at 08/18/2019 2000 Gross per 24 hour  Intake 615 ml  Output 2052 ml  Net -1437 ml       Labs: Basic Metabolic Panel: Recent Labs  Lab 08/15/19 0144  08/16/19 0417 08/17/19 0727 08/18/19 0335 08/19/19 0530  NA 135   < > 133* 132* 132* 134*  K 5.8*   < > 4.6 4.6 3.6 3.4*  CL 95*   < > 94* 92* 94* 95*  CO2 23   < > 23 22 27 27   GLUCOSE 146*   < > 156* 143* 225* 190*  BUN 38*   < > 22 33* 20 14  CREATININE 5.27*   < > 3.60* 4.48* 3.47* 2.92*  CALCIUM 9.0   < > 9.0 9.2 8.6*  9.2  PHOS 6.8*  --  5.4* 6.0*  --   --    < > = values in this interval not displayed.   Liver Function Tests: Recent Labs  Lab 08/14/19 2016 08/15/19 0144 08/16/19 0417 08/17/19 0727  AST 33 36  --   --   ALT 20 22  --   --   ALKPHOS 135* 138*  --   --   BILITOT 2.0* 2.1*  --   --   PROT 6.4* 6.4*  --   --   ALBUMIN 3.0* 3.2* 3.0* 3.0*   No results for input(s): LIPASE, AMYLASE in the last 168 hours. No results for input(s): AMMONIA in the last 168 hours. CBC: Recent Labs  Lab 08/14/19 2016  08/15/19 0425 08/16/19 0417 08/17/19 0727 08/18/19 0335 08/19/19 0530  WBC 6.9  --  7.1 8.2 10.1 8.5 7.0  NEUTROABS 4.7  --   --   --   --   --   --   HGB 11.9*   < > 11.4* 11.4* 11.5* 11.3* 11.1*  HCT 37.9*   < > 35.1* 34.7* 36.4* 34.3* 34.5*  MCV 98.4  --  95.1 94.0 94.3 93.5 95.3  PLT 172  --  185 187 217 169 164   < > = values in this interval not displayed.   Cardiac Enzymes: No results for input(s): CKTOTAL, CKMB, CKMBINDEX, TROPONINI in the last 168 hours. CBG: Recent Labs  Lab 08/17/19 2207 08/18/19 1151 08/18/19 1648 08/18/19 2202 08/19/19 0750  GLUCAP 197* 144* 205* 241* 186*    Iron Studies: No results for input(s): IRON, TIBC, TRANSFERRIN, FERRITIN in the last 72 hours. Studies/Results: No results found.  Medications: Infusions:   Scheduled Medications: . apixaban  2.5 mg Oral BID  . atorvastatin  80 mg Oral q1800  . Chlorhexidine Gluconate Cloth  6 each Topical Q0600  . clopidogrel  75 mg Oral Daily  . doxercalciferol  1 mcg Intravenous Q M,W,F-HD  . fluticasone furoate-vilanterol  1 puff Inhalation Daily  . insulin aspart  0-5 Units Subcutaneous QHS  . insulin aspart  0-9 Units Subcutaneous TID WC  . mouth rinse  15 mL Mouth Rinse BID  . midodrine  10 mg Oral TID WC  . pantoprazole  40 mg Oral Daily  . pneumococcal 23 valent vaccine  0.5 mL Intramuscular Tomorrow-1000  . sevelamer carbonate  1,600 mg Oral TID WC    have reviewed scheduled and  prn medications.  Physical Exam: unchanged General:NAD, comfortable Heart: Irregular heart rate, s1s2 nl Lungs: Clear bilateral, no wheezing Abdomen:soft, Non-tender, non-distended Extremities: Left BKA, has dependent edema Dialysis Access: Left upper extremity AV fistula Neurology: Alert, awake and following commands  Dron Tanna Furry 08/19/2019,9:34 AM  LOS: 4 days  Pager: ID:[phone]

## 2019-08-19 NOTE — Consult Note (Signed)
Cardiology Consultation:   Patient ID: Jeffrey [name] MRN: [id number]; DOB: 01/26/1941  Admit date: 08/14/2019 Date of Consult: 08/19/2019  Primary Care Provider: Scot Jun, FNP Primary Cardiologist: [name], MD     Patient Profile:   Jeffrey [name] is a 78 y.o. male with a hx of CHB w/PPM, CAD (chronically occluded RCA going back to 2015. Patient admitted for NSTEMI in 03/2018 and found to have severe multivessel CAD with chronically occluded RCA, 95% stenosis of ostial CX, 60-70% of proximal LAD, 70% stenosis of ostial 2nd Diagonal, and 30% stenosis of proximal to mid CX. 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 Debara Pickett.  History of Present Illness:   Jeffrey [name] was admitted to [facility] 08/14/2019, in review of notes, EMS was called with lethargy, decreased LOC, EMS arrived, patient hypotensive with BP of 80/70 and hypoxic with O2 sats in the 70-80%. Patient was placed on a non-rebreather and was given fluid bolus with some improvement. In the ED, EKG showed v-paced rhythm. Labs showed WBC 6.9, Hgb 11.9, Plts 172, Na 134, K 6.1, Glucose 219, BUN 35, Scr 5.19, AST 33, ALT 20, Alk Phos 135, Total Bili 2.0. BNP 1,710.9. High-sensitivity troponin 48 >> 49. Chest x-ray showed cardiomegaly with vascular congestion. Chest CTA showed no large centrally located PE.  He was admitted for management of volume OL and for possible syncope.  His w/u has noted his LVEF further reduced to 25-30% (from 45-50% in May 2019). Cardiology was consulted.  They elicited reports that earlier in the day  of his admission by his pulse Ox HR were reported to be 200's.  EP is asked to see with concerns if RV pacing may be etiology of worsening LVEF, +/- his AFib as well, consideration of device upgrade to CRT.   Device information: Biotronik dual chamber PPM, implanted 07/11/15  Device interrogation done 08/18/2019 Battery and lead measurements are good 100% AFib in the last 12 months (known to be permanent) No HVR episodes 98% VP  Heart Pathway Score:     Past Medical History:  Diagnosis Date  . Anginal pain ([place])   . Aortic stenosis    a. severe by echo 09/2014  . Atrial fibrillation ([place])    a. not well documented, not on anticoagulation  . CHF (congestive heart failure) ([place])    04/28/17 echo-EF 40%, mod LVH, diastolic dysfunction  . Claustrophobia   . Complete heart block ([place])   . COPD (chronic obstructive pulmonary disease) ([place])   . Coronary artery disease    a. chronically occluded RCA per cath 09/2014 with collaterals B. cath 05/01/17 chr occ RCA w/collaterals, 60-70% mid LAD,   . CVA (cerebral vascular accident) ([place]) 10/2014   denies residual on 07/11/2015  . ESRD (end stage renal disease) on dialysis [facility])    a. on dialysis; [place]; MWF, LUE fistula (07/11/2015)  . History of blood transfusion    "related to gallbladder OR"  . History of stomach ulcers   . Hyperlipidemia   . Hypertension   . Iron deficiency anemia   . Myocardial  infarction ([place]) 10/2014  . Peripheral vascular disease ([place])   . Pneumonia   . Presence of permanent cardiac pacemaker   . 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 Type II diabetes mellitus ([place])     Past Surgical History:  Procedure Laterality Date  . ABDOMINAL AORTOGRAM W/LOWER EXTREMITY Bilateral 03/27/2019   Procedure: ABDOMINAL AORTOGRAM W/LOWER EXTREMITY;  Surgeon: Angelia Mould, MD;  Location: [place] CV LAB;  Service: Cardiovascular;  Laterality: Bilateral;  . AMPUTATION Left 04/14/2019   Procedure: AMPUTATION BELOW KNEE;  Surgeon: Angelia Mould, MD;  Location: [place];  Service: Vascular;  Laterality: Left;  . AV FISTULA PLACEMENT Left 10/19/2014   Procedure: BRACHIOCEPHALIC ARTERIOVENOUS (AV) FISTULA CREATION ;  Surgeon: Conrad Luther, MD;  Location: [place];  Service: Vascular;  Laterality: Left;  . CARDIAC CATHETERIZATION    . CARDIAC CATHETERIZATION N/A 07/22/2015   Procedure: Right/Left Heart Cath and Coronary Angiography;  Surgeon: [name], MD;  Location: [place] CV LAB;  Service: Cardiovascular;  Laterality: N/A;  . CATARACT EXTRACTION W/ INTRAOCULAR LENS  IMPLANT, BILATERAL Bilateral 1990's  . CHOLECYSTECTOMY OPEN  1980's  . COLONOSCOPY W/ BIOPSIES AND POLYPECTOMY    . CORONARY ANGIOGRAPHY N/A 07/31/2018   Procedure: CORONARY ANGIOGRAPHY;  Surgeon: Martinique, Peter M, MD;  Location: [place] CV LAB;  Service: Cardiovascular;  Laterality: N/A;  . CORONARY ANGIOPLASTY    . CORONARY ATHERECTOMY N/A 04/18/2018   Procedure: CORONARY ATHERECTOMY;  Surgeon: [name], MD;  Location: [place] CV LAB;  Service: Cardiovascular;  Laterality: N/A;  . CORONARY BALLOON ANGIOPLASTY N/A 07/31/2018   Procedure: CORONARY BALLOON ANGIOPLASTY;  Surgeon: Martinique, Peter M, MD;  Location: [place] CV LAB;  Service: Cardiovascular;  Laterality: N/A;  . CORONARY STENT INTERVENTION N/A 04/18/2018   Procedure: CORONARY STENT INTERVENTION;  Surgeon: [name], MD;  Location: [place] CV LAB;  Service: Cardiovascular;  Laterality: N/A;  . EP IMPLANTABLE DEVICE N/A 07/11/2015   Procedure: Pacemaker Implant;  Surgeon: Christphor Groft Meredith Leeds, MD;  Location: [place] CV LAB;  Service: Cardiovascular;  Laterality: N/A;  . ESOPHAGOGASTRODUODENOSCOPY  08/01/2012   Procedure: ESOPHAGOGASTRODUODENOSCOPY (EGD);  Surgeon: [name], MD;  Location: [facility];   Service: Endoscopy;  Laterality: N/A;  . INSERT / REPLACE / REMOVE PACEMAKER  07/11/2015  . INSERTION OF DIALYSIS CATHETER Right 02/02/2015   Procedure: INSERTION OF DIALYSIS CATHETER  RIGHT INTERNAL JUGULAR;  Surgeon: Mal [name], MD;  Location: [place];  Service: Vascular;  Laterality: Right;  . LEFT AND RIGHT HEART CATHETERIZATION WITH CORONARY ANGIOGRAM N/A 09/30/2014   Procedure: LEFT AND RIGHT HEART CATHETERIZATION WITH CORONARY ANGIOGRAM;  Surgeon: [name], MD;  Location: [facility] CATH LAB;  Service: Cardiovascular;  Laterality: N/A;  . LEFT HEART CATH AND CORONARY ANGIOGRAPHY N/A 04/14/2018   Procedure: LEFT HEART CATH AND CORONARY ANGIOGRAPHY;  Surgeon: [name], MD;  Location: [place] CV LAB;  Service: Cardiovascular;  Laterality: N/A;  . TEE WITHOUT CARDIOVERSION N/A 08/02/2015   Procedure: TRANSESOPHAGEAL ECHOCARDIOGRAM (TEE);  Surgeon: [name], MD;  Location: [place];  Service: Open Heart Surgery;  Laterality: N/A;  . TONSILLECTOMY    . TRANSCATHETER AORTIC VALVE REPLACEMENT, TRANSFEMORAL Left 08/02/2015   Procedure: TRANSCATHETER AORTIC VALVE REPLACEMENT, TRANSFEMORAL;  Surgeon: [name], MD;  Location: [place];  Service: Open Heart Surgery;  Laterality: Left;  Home Medications:  Prior to Admission medications   Medication Sig Start Date End Date Taking? Authorizing Provider  acetaminophen (TYLENOL) 500 MG tablet Take 1 tablet (500 mg total) by mouth every 6 (six) hours as needed for mild pain or headache (pain). Patient taking differently: Take 1,000 mg by mouth every 6 (six) hours as needed for headache (pain).  04/18/19  Yes Pokhrel, Laxman, MD  albuterol (PROVENTIL HFA;VENTOLIN HFA) 108 (90 Base) MCG/ACT inhaler Inhale 2 puffs into the lungs every 4 (four) hours as needed for wheezing or shortness of breath. 11/27/18  Yes Tanda Rockers, MD  albuterol (PROVENTIL) (2.5 MG/3ML) 0.083% nebulizer solution Take 3 mLs (2.5 mg total) by nebulization every 6 (six) hours  as needed for wheezing or shortness of breath. 01/07/19  Yes Scot Jun, FNP  apixaban (ELIQUIS) 2.5 MG TABS tablet Take 1 tablet (2.5 mg total) by mouth 2 (two) times daily. 04/29/19  Yes Deboraha Sprang, MD  atorvastatin (LIPITOR) 80 MG tablet Take 1 tablet (80 mg total) by mouth daily at 6 PM. 01/07/19  Yes Scot Jun, FNP  B Complex-C-Zn-Folic Acid (DIALYVITE 169 WITH ZINC) 0.8 MG TABS Take 1 tablet by mouth daily. 05/13/19  Yes [provider]  budesonide-formoterol (SYMBICORT) 160-4.5 MCG/ACT inhaler Inhale 2 puffs into the lungs 2 (two) times daily as needed.   Yes [provider]  cinacalcet (SENSIPAR) 30 MG tablet Take 30 mg by mouth See admin instructions. Take one tablet (30 mg) by mouth on Monday, Tuesday, Thursday, Saturday after dialysis   Yes [provider]  clopidogrel (PLAVIX) 75 MG tablet Take 1 tablet (75 mg total) by mouth daily. 04/29/19  Yes Deboraha Sprang, MD  doxercalciferol (HECTOROL) 4 MCG/2ML injection Inject 0.5 mLs (1 mcg total) into the vein Every Tuesday,Thursday,and Saturday with dialysis. 02/26/19  Yes Norval Morton, MD  hydrOXYzine (ATARAX/VISTARIL) 25 MG tablet Take 1 tablet (25 mg total) by mouth every 8 (eight) hours as needed for anxiety. 04/22/19  Yes Scot Jun, FNP  Insulin Pen Needle 31G X 6 MM MISC at bedtime.  08/28/18  Yes [provider]  LEVEMIR FLEXTOUCH 100 UNIT/ML Pen INJECT 48 UNITS TOTAL INTO THE SKIN AT BEDTIME. Patient taking differently: Inject 18 Units into the skin at bedtime.  07/17/19  Yes Charlott Rakes, MD  lidocaine-prilocaine (EMLA) cream Apply 1 application topically as needed (dialysis days).   Yes [provider]  loperamide (IMODIUM) 2 MG capsule Take 2 mg by mouth as needed. 02/26/19 02/25/20 Yes [provider]  loratadine (CLARITIN) 10 MG tablet Take 10 mg by mouth daily.    Yes [provider]  Melatonin 5 MG TABS Take 5-10 mg by mouth at bedtime as needed  (sleep).    Yes [provider]  metoprolol tartrate (LOPRESSOR) 25 MG tablet Take 0.5 tablets (12.5 mg total) by mouth See admin instructions. Take 1/2 tablet (12.5 mg) by mouth on Sunday, Monday, Wednesday, Friday mornings (non-dialysis days), take 1/2 tablet (12.5 mg) every evening Patient taking differently: Take 12.5 mg by mouth See admin instructions. Take 1/2 tablet (12.5 mg) by mouth on Sunday,, Wednesday, Friday mornings (non-dialysis days), take 1/2 tablet (12.5 mg) every evening 04/29/19  Yes Deboraha Sprang, MD  midodrine (PROAMATINE) 10 MG tablet Take 10 mg by mouth See admin instructions. Take one tablet (10 mg) by mouth 30 minutes before dialysis on Monday, Tuesday, Thursday, Saturday 02/05/19  Yes [provider]  midodrine (PROAMATINE) 2.5 MG tablet  Take 1 tablet (2.5 mg total) by mouth 3 (three) times daily with meals. Patient should continue to take 10 mg in the morning prior to hemodialysis sessions. Patient taking differently: Take 2.5 mg by mouth See admin instructions. Take one tablet (2.5 mg) by mouth three times daily on Sunday, Wednesday and Friday; take one tablet (2.5 mg) twice daily on Monday, Tuesday, Thursday, Saturday (after dialysis and in the evening) . Patient should continue to take 10 mg tablet in the morning prior to hemodialysis sessions. 04/24/19  Yes Scot Jun, FNP  mirtazapine (REMERON) 15 MG tablet Take 7.5 mg by mouth at bedtime. For appetite/sleep 06/04/19  Yes [provider]  nitroGLYCERIN (NITROSTAT) 0.4 MG SL tablet Place 1 tablet (0.4 mg total) under the tongue every 5 (five) minutes as needed for chest pain. 04/19/18  Yes Rai, Ripudeep K, MD  oxyCODONE-acetaminophen (PERCOCET/ROXICET) 5-325 MG tablet Take 1-2 tablets by mouth every 6 (six) hours as needed for severe pain. 2/29/79  Yes Delora Fuel, MD  OXYGEN Inhale 3 L into the lungs continuous. 2-3 lpm 24/7    Yes [provider]  pantoprazole (PROTONIX) 40 MG tablet  Take 1 tablet (40 mg total) by mouth daily. 04/29/19  Yes Deboraha Sprang, MD  sevelamer carbonate (RENVELA) 800 MG tablet Take 1,600-2,400 mg by mouth See admin instructions. Take 3 tablets (2400 mg) by mouth with meals and 2 tablets (1600 mg) with snacks   Yes [provider]  vancomycin (VANCOCIN) 125 MG capsule Take 125 mg by mouth every 6 (six) hours.   Yes [provider]  ALPRAZolam (XANAX) 0.25 MG tablet Take 1 tablet (0.25 mg total) by mouth 2 (two) times daily as needed for anxiety. Patient not taking: Reported on 08/15/2019 02/24/19   Norval Morton, MD  insulin detemir (LEVEMIR) 100 UNIT/ML injection Inject 0.2 mLs (20 Units total) into the skin at bedtime. Patient not taking: Reported on 08/15/2019 04/18/19   Pokhrel, Corrie Mckusick, MD  polyethylene glycol powder (GLYCOLAX/MIRALAX) 17 GM/SCOOP powder Take 17 g by mouth 2 (two) times daily. Patient not taking: Reported on 08/15/2019 04/19/19   Montine Circle, PA-C    Inpatient Medications: Scheduled Meds: . apixaban  2.5 mg Oral BID  . atorvastatin  80 mg Oral q1800  . Chlorhexidine Gluconate Cloth  6 each Topical Q0600  . Chlorhexidine Gluconate Cloth  6 each Topical Q0600  . clopidogrel  75 mg Oral Daily  . doxercalciferol  1 mcg Intravenous Q M,W,F-HD  . fluticasone furoate-vilanterol  1 puff Inhalation Daily  . insulin aspart  0-5 Units Subcutaneous QHS  . insulin aspart  0-9 Units Subcutaneous TID WC  . mouth rinse  15 mL Mouth Rinse BID  . midodrine  10 mg Oral TID WC  . pantoprazole  40 mg Oral Daily  . pneumococcal 23 valent vaccine  0.5 mL Intramuscular Tomorrow-1000  . sevelamer carbonate  1,600 mg Oral TID WC   Continuous Infusions:  PRN Meds: acetaminophen, albuterol, hydrOXYzine  Allergies:    Allergies  Allergen Reactions  . Byetta 10 Mcg Pen [Exenatide] Diarrhea and Nausea And Vomiting  . Telmisartan Other (See Comments)    Unknown reaction - reported by [facility]  . Codeine Itching    Minor  reaction  . Coumadin [Warfarin Sodium] Rash    (wife states coumadin was stopped but rash did not disappear 02/21/19)    Social History:   Social History   Socioeconomic History  . Marital status: Married  Spouse name: Not on file  . Number of children: 3  . Years of education: Not on file  . Highest education level: Not on file  Occupational History  . Occupation: retired  Scientific laboratory technician  . Financial resource strain: Not on file  . Food insecurity    Worry: Not on file    Inability: Not on file  . Transportation needs    Medical: Not on file    Non-medical: Not on file  Tobacco Use  . Smoking status: Former Smoker    Packs/day: 2.00    Years: 32.00    Pack years: 64.00    Quit date: 11/27/1983    Years since quitting: 35.7  . Smokeless tobacco: Never Used  Substance and Sexual Activity  . Alcohol use: Yes    Comment: rarely  . Drug use: No  . Sexual activity: Yes    Birth control/protection: None  Lifestyle  . Physical activity    Days per week: Not on file    Minutes per session: Not on file  . Stress: Not on file  Relationships  . Social Herbalist on phone: Not on file    Gets together: Not on file    Attends religious service: Not on file    Active member of club or organization: Not on file    Attends meetings of clubs or organizations: Not on file    Relationship status: Not on file  . Intimate partner violence    Fear of current or ex partner: Not on file    Emotionally abused: Not on file    Physically abused: Not on file    Forced sexual activity: Not on file  Other Topics Concern  . Not on file  Social History Narrative  . Not on file    Family History:   Family History  Problem Relation Age of Onset  . Diabetes Father   . Heart disease Father   . Diabetes Sister   . Alzheimer's disease Mother   . Stroke Mother   . Diabetes Paternal Grandmother   . Alzheimer's disease Maternal Aunt        x 8 Maternal Aunts  . Cancer Maternal  Uncle        type unknown     ROS:  Please see the history of present illness.  All other ROS reviewed and negative.     Physical Exam/Data:   Vitals:   08/19/19 0900 08/19/19 1000 08/19/19 1139 08/19/19 1200  BP: (!) 96/51 (!) 83/64  (!) 112/59  Pulse: (!) 58 (!) 55  (!) 55  Resp: 20 (!) 22  (!) 27  Temp:   (!) 97.1 F (36.2 C)   TempSrc:   Axillary   SpO2: 97% 98%  98%  Weight:      Height:        Intake/Output Summary (Last 24 hours) at 08/19/2019 1242 Last data filed at 08/19/2019 1000 Gross per 24 hour  Intake 720 ml  Output -  Net 720 ml   Last 3 Weights 08/19/2019 08/18/2019 08/18/2019  Weight (lbs) 212 lb 1.3 oz 211 lb 13.8 oz 218 lb 0.6 oz  Weight (kg) 96.2 kg 96.1 kg 98.9 kg     Body mass index is 29.58 kg/m.  General:  Well nourished, well developed, in no acute distress HEENT: normal Lymph: no adenopathy Neck: no JVD Endocrine:  No thryomegaly Vascular: No carotid bruits; FA pulses 2+ bilaterally without bruits  Cardiac:  normal  S1, S2; RRR; no murmur  Lungs:  clear to auscultation bilaterally, no wheezing, rhonchi or rales  Abd: soft, nontender, no hepatomegaly  Ext: no edema Musculoskeletal:  L BKA, sores on right foot Skin: warm and dry  Neuro:  CNs 2-12 intact, no focal abnormalities noted Psych:  Normal affect   EKG:  The EKG was personally reviewed and demonstrates:    V paced, looks AF underlying  Telemetry:  Telemetry was personally reviewed and demonstrates:  V paced, AF  Relevant CV Studies:  08/15/2019: TTE IMPRESSIONS  1. Left ventricular ejection fraction, by visual estimation, is 25 to 30%. The left ventricle has severely decreased function. Moderately increased left ventricular size. There is no left ventricular hypertrophy. There has been a decline in LVEF  compared to prior study.  2. Multiple segmental abnormalities exist. See findings.  3. Indeterminate diastolic filling due to E-A fusion pattern of LV diastolic filling.  4.  Global right ventricle has moderately reduced systolic function.The right ventricular size is moderately enlarged. No increase in right ventricular wall thickness.  5. Left atrial size was moderately dilated.  6. Right atrial size was moderately dilated.  7. Moderate mitral annular calcification.  8. The mitral valve is degenerative. Moderate mitral valve regurgitation.  9. The tricuspid valve is grossly normal. Tricuspid valve regurgitation moderate. 10. Aortic valve mean gradient measures 8.0 mmHg. 11. Aortic valve regurgitation is trivial by color flow Doppler. 12. 29 mm Edwards Sapien 3 (TAVR) in aortic position. No obvious perivalvular leak noted. 13. The pulmonic valve was not well visualized. Pulmonic valve regurgitation is not visualized by color flow Doppler. 14. Moderately elevated pulmonary artery systolic pressure. 15. The tricuspid regurgitant velocity is 3.15 m/s, and with an assumed right atrial pressure of 15 mmHg, the estimated right ventricular systolic pressure is moderately elevated at 54.7 mmHg. 16. A pacer wire is visualized. 17. The inferior vena cava is dilated in size with <50% respiratory variability, suggesting right atrial pressure of 15 mmHg.   07/31/18: LHC  Ost 2nd Diag lesion is 70% stenosed.  Previously placed Prox LAD-2 drug eluting stent is widely patent.  Previously placed Prox LAD-1 drug eluting stent is widely patent.  Prox Cx to Mid Cx lesion is 30% stenosed.  Prox RCA to Dist RCA lesion is 100% stenosed.  Ost Cx lesion is 95% stenosed.  Post intervention, there is a 10% residual stenosis.  Scoring balloon angioplasty was performed using a BALLOON [place] 4.00X10.  Balloon angioplasty was performed using a BALLOON [place] EMERGE MR 4.5X12.   1. Severe 2 vessel obstructive CAD    - 95% in stent restenosis of the ostial LCx    - 100% CTO of the RCA 2. Stent in the proximal to mid LAD is patent 3. Successful scoring balloon/PTCA of the ostial  LCx  Recommend uninterrupted dual antiplatelet therapy with Aspirin 36m daily and Clopidogrel 793mdaily for a minimum of 12 months (ACS - Class I recommendation).  04/17/18: TTE Study Conclusions - Left ventricle: The cavity size was normal. There was moderate   concentric hypertrophy. Systolic function was mildly reduced. The   estimated ejection fraction was in the range of 45% to 50%. There   is akinesis of the apicalanterior, lateral, inferior, and apical   myocardium. The study is not technically sufficient to allow   evaluation of LV diastolic function. - Aortic valve: Bioprosthetic valve s/p TAVR with normal valve   function. There was mild-moderate perivalvular regurgitation.   Mean gradient (S): 8  mm Hg. Valve area (VTI): 1.25 cm^2. Valve   area (Vmax): 1.08 cm^2. Valve area (Vmean): 1.12 cm^2.   Regurgitation pressure half-time: 367 ms. - Mitral valve: Severely calcified annulus. There was mild   regurgitation. - Left atrium: The atrium was severely dilated. - Pulmonary arteries: Systolic pressure could not be accurately   estimated. - Inferior vena cava: The vessel was dilated. The respirophasic   diameter changes were in the normal range (>= 50%). - Pericardium, extracardiac: A trivial, free-flowing pericardial   effusion was identified circumferential to the heart.  Laboratory Data:  High Sensitivity Troponin:   Recent Labs  Lab 08/14/19 2016 08/14/19 2210  TROPONINIHS 48* 49*     Chemistry Recent Labs  Lab 08/17/19 0727 08/18/19 0335 08/19/19 0530  NA 132* 132* 134*  K 4.6 3.6 3.4*  CL 92* 94* 95*  CO2 _0 GLUCOSE 143* 225* 190*  BUN 33* 20 14  CREATININE 4.48* 3.47* 2.92*  CALCIUM 9.2 8.6* 9.2  GFRNONAA 12* 16* 19*  GFRAA 13* 18* 22*  ANIONGAP 18* 11 12    Recent Labs  Lab 08/14/19 2016 08/15/19 0144 08/16/19 0417 08/17/19 0727  PROT 6.4* 6.4*  --   --   ALBUMIN 3.0* 3.2* 3.0* 3.0*  AST 33 36  --   --   ALT 20 22  --   --    ALKPHOS 135* 138*  --   --   BILITOT 2.0* 2.1*  --   --    Hematology Recent Labs  Lab 08/17/19 0727 08/18/19 0335 08/19/19 0530  WBC 10.1 8.5 7.0  RBC 3.86* 3.67* 3.62*  HGB 11.5* 11.3* 11.1*  HCT 36.4* 34.3* 34.5*  MCV 94.3 93.5 95.3  MCH 29.8 30.8 30.7  MCHC 31.6 32.9 32.2  RDW 16.5* 16.7* 17.0*  PLT 217 169 164   BNP Recent Labs  Lab 08/14/19 2016  BNP 1,710.9*    DDimer No results for input(s): DDIMER in the last 168 hours.   Radiology/Studies:  No results found.  Assessment and Plan:   1. PPM (implant indication was CHB)     Device interrogation notes intact function/measurements are good     Battery with 70% battery life remaining  2. AFib     Dr. Caryl Comes has described his AF as permanent     He is 100% AFib since 08/06/18      CHA2DS2Vasc is 7, on Eliquis, renally dosed     LA is described as mod dilated by his echo, 33m     I don't think DCCV would be successful or long term solution  3. Worsening LVEF     Etiology could be his RV pacing     He also has severe CAD  4. Syncope     Persistently hypotensive and agree, this likely the etiology given no HVR/VT noted by his device        Upgrade of his PPM to CRT P may be a consideration though given he is on dialysis this would be at very high infection risk, (noting he has had infection issues/non-healing foot wound in the past that required BKA)   Given PPM dependence, AVR in place, and frail state, endocarditis/bacteremia would be particularly awful for him  Given advanced age and no VT to explain his syncope, not certain that we should/need to consider ICD Dr. KCaryl Comeshas mentioned VT in his notes though this appears to have been back in 2017 and was NSVT   Dr. CCurt Bears  Duane Trias see later today      For questions or updates, please contact [facility] Please consult [email or web address] for contact info under     Signed, Baldwin Jamaica, PA-C  08/19/2019 12:42 PM  I have seen and examined this  patient with Tommye Standard.  Agree with above, note added to reflect my findings.  On exam, RRR, no murmurs, lungs clear. Patient with permanent AF and a pacemaker for complete heart block. At this point, since he has been in AF for the last 1.5 years at least, he would likely not be able to convert and stay in sinus rhythm. Would not attempt cardioversion at this time. He also has a pacemaker and now had an EF of 20-25%. He had syncope but no high V rates seen on his device and this this is not arrhythmogenic. Would hold off on any conversation about device upgrade at this time as he has nonhealing wounds on his legs and would be an extremely high infection risk.    Tiny Rietz M. Glenette Bookwalter MD 08/19/2019 4:24 PM

## 2019-08-19 NOTE — Consult Note (Addendum)
[place] Nurse wound consult note Reason for Consult: chronic, nonhealing wounds on the RLE. Managed by the outpatient [facility] with weekly changes. Wound type: mixed etiology, venous and arterial insufficiency Pressure Injury POA: N/A Measurement: Right medial LE:  4cm x 3.4cm partial thickness pink, moist ulceration with 0.1cm depth. There is a darkened center in the wound today measuring 1cm x 1.6cm. Moderate amount exudate (dried) on old dressing. Right lateral malleolus: 2cm x 1cm raised area with 1cm x 0.8cm x 0.2cm in center. Red base. Small amount exudate Right foot, second digit:  0.5cm round eschar. Dry. Right lateral LE: 2cm x 0.6cm x 0.1cm partial thickness ulcer, red, moist, no exudate Wound bed: As described above Drainage (amount, consistency, odor) As described above Periwound: ruddy with hemosiderin staining consistent with wounds of mixed etiology Dressing procedure/placement/frequency: Currently dressings are being changed once weekly; I will convert to a daily change while in house and patient can resume his care with the outpatient [facility] after discharge.  We do not use a Manuka honey product in house so I will substitute a silver hydrofiber (another antimicrobial) in its place.  [place] nursing team will not follow, but will remain available to this patient, the nursing and medical teams.  Please re-consult if needed. Thanks, Maudie Flakes, MSN, RN, [place], Arther Abbott  Pager# [phone]

## 2019-08-20 LAB — CULTURE, BLOOD (ROUTINE X 2)
Culture: NO GROWTH
Special Requests: ADEQUATE

## 2019-08-20 LAB — MAGNESIUM: Magnesium: 1.9 mg/dL (ref 1.7–2.4)

## 2019-08-20 LAB — CBC
HCT: 34.4 % — ABNORMAL LOW (ref 39.0–52.0)
Hemoglobin: 11.2 g/dL — ABNORMAL LOW (ref 13.0–17.0)
MCH: 30.7 pg (ref 26.0–34.0)
MCHC: 32.6 g/dL (ref 30.0–36.0)
MCV: 94.2 fL (ref 80.0–100.0)
Platelets: 167 10*3/uL (ref 150–400)
RBC: 3.65 MIL/uL — ABNORMAL LOW (ref 4.22–5.81)
RDW: 17.1 % — ABNORMAL HIGH (ref 11.5–15.5)
WBC: 7.2 10*3/uL (ref 4.0–10.5)
nRBC: 0 % (ref 0.0–0.2)

## 2019-08-20 LAB — BASIC METABOLIC PANEL
Anion gap: 16 — ABNORMAL HIGH (ref 5–15)
BUN: 22 mg/dL (ref 8–23)
CO2: 24 mmol/L (ref 22–32)
Calcium: 9.2 mg/dL (ref 8.9–10.3)
Chloride: 92 mmol/L — ABNORMAL LOW (ref 98–111)
Creatinine, Ser: 4.09 mg/dL — ABNORMAL HIGH (ref 0.61–1.24)
GFR calc Af Amer: 15 mL/min — ABNORMAL LOW (ref >60)
GFR calc non Af Amer: 13 mL/min — ABNORMAL LOW (ref >60)
Glucose, Bld: 179 mg/dL — ABNORMAL HIGH (ref 70–99)
Potassium: 3.9 mmol/L (ref 3.5–5.1)
Sodium: 132 mmol/L — ABNORMAL LOW (ref 135–145)

## 2019-08-20 LAB — GLUCOSE, CAPILLARY: Glucose-Capillary: 163 mg/dL — ABNORMAL HIGH (ref 70–99)

## 2019-08-20 LAB — PHOSPHORUS: Phosphorus: 4.6 mg/dL (ref 2.5–4.6)

## 2019-08-20 MED ORDER — ACETAMINOPHEN 325 MG PO TABS
ORAL_TABLET | ORAL | Status: AC
  Filled 2019-08-20: qty 2

## 2019-08-20 MED ORDER — MIDODRINE HCL 5 MG PO TABS
ORAL_TABLET | ORAL | Status: AC
  Filled 2019-08-20: qty 2

## 2019-08-20 NOTE — Progress Notes (Signed)
DAILY PROGRESS NOTE   Patient Name: Jeffrey [name] Date of Encounter: 08/20/2019 Cardiologist: [name], MD  Chief Complaint   Decreased EF  Patient Profile   Jeffrey [name] is a 78 y.o. male with a history of multivessel CAD s/p PTCA/orbital atherectomy/DES of mid LAD and ostium circumflex and chronically occluded RCA on cath in 03/2018 with in-stent restenosis of ostium circumflex in 07/2018 requiring balloon/PTCA, severe aortic stenosis s/p TAVR in 2016, PAD s/p left below knee amputation in 03/2019 for nonhealing left foot wound, chronic combined CHF, persistent atrial fibrillation on Eliquis, complete heart blocker s/p PPM, ESRD on MWF, prior CVA, hypertension, hyperlipidemia, type 2 diabetes mellitus, who is being seen today for the evaluation of reduced EF at the request of Dr. Sherral Hammers.  Subjective   Breathing is ok, no chest pain He wonders if his problems are due to progression of CAD, wonders if he needs a cath. After talking to NP last pm (does not realize she was from [place]), he says he just wants to go home and be with his family, including 36-week old great-granddaughter. "I'm not ready to go (die)....but if they can't do anything for me, I just want to go home."  Objective   Vitals:   08/19/19 2059 08/20/19 0610 08/20/19 0835 08/20/19 0922  BP: (!) 88/48 (!) 93/22    Pulse: (!) 54 (!) 56    Resp: 19 18    Temp: 98 F (36.7 C) 98 F (36.7 C)  98 F (36.7 C)  TempSrc:  Oral  Oral  SpO2: 97% 98% 97% 98%  Weight:      Height:        Intake/Output Summary (Last 24 hours) at 08/20/2019 0956 Last data filed at 08/20/2019 [id number] Gross per 24 hour  Intake 360 ml  Output 0 ml  Net 360 ml   Filed Weights   08/18/19 0747 08/18/19 1116 08/19/19 0500  Weight: 98.9 kg 96.1 kg 96.2 kg    Physical Exam   General appearance: alert and no distress Neck: JVD - not seen cm above sternal notch, no JVD and thyroid not enlarged, symmetric, no  tenderness/mass/nodules Lungs: diminished breath sounds bibasilar and bilaterally Heart: regular rate and rhythm Abdomen: soft, non-tender; bowel sounds normal; no masses,  no organomegaly Extremities: s/p L BKA, RLE has wrap in place, not disturbed Pulses: wrap covers RLE pulse but cap refill WNL Skin: Skin color, texture, turgor normal. No rashes or lesions Neurologic: Grossly normal no focal deficits  Inpatient Medications    Scheduled Meds: . apixaban  2.5 mg Oral BID  . atorvastatin  80 mg Oral q1800  . Chlorhexidine Gluconate Cloth  6 each Topical Q0600  . Chlorhexidine Gluconate Cloth  6 each Topical Q0600  . clopidogrel  75 mg Oral Daily  . doxercalciferol  1 mcg Intravenous Q M,W,F-HD  . fluticasone furoate-vilanterol  1 puff Inhalation Daily  . insulin aspart  0-5 Units Subcutaneous QHS  . insulin aspart  0-9 Units Subcutaneous TID WC  . mouth rinse  15 mL Mouth Rinse BID  . midodrine  10 mg Oral TID WC  . pantoprazole  40 mg Oral Daily  . pneumococcal 23 valent vaccine  0.5 mL Intramuscular Tomorrow-1000  . sevelamer carbonate  1,600 mg Oral TID WC    Continuous Infusions:   PRN Meds: acetaminophen, albuterol, hydrOXYzine   Labs   Results for orders placed or performed during the hospital encounter of 08/14/19 (from the past 48 hour(s))  Glucose, capillary     Status: Abnormal   Collection Time: 08/18/19 11:51 AM  Result Value Ref Range   Glucose-Capillary 144 (H) 70 - 99 mg/dL  Lipid panel     Status: Abnormal   Collection Time: 08/18/19  2:59 PM  Result Value Ref Range   Cholesterol 60 0 - 200 mg/dL   Triglycerides 84 <150 mg/dL   HDL 17 (L) >40 mg/dL   Total CHOL/HDL Ratio 3.5 RATIO   VLDL 17 0 - 40 mg/dL   LDL Cholesterol 26 0 - 99 mg/dL    Comment:        Total Cholesterol/HDL:CHD Risk Coronary Heart Disease Risk Table                     Men   Women  1/2 Average Risk   3.4   3.3  Average Risk       5.0   4.4  2 X Average Risk   9.6   7.1  3 X  Average Risk  23.4   11.0        Use the calculated Patient Ratio above and the CHD Risk Table to determine the patient's CHD Risk.        ATP III CLASSIFICATION (LDL):  <100     mg/dL   Optimal  100-129  mg/dL   Near or Above                    Optimal  130-159  mg/dL   Borderline  160-189  mg/dL   High  >190     mg/dL   Very High Performed at [facility] [address] [place], [place] [address]   TSH     Status: Abnormal   Collection Time: 08/18/19  2:59 PM  Result Value Ref Range   TSH 5.976 (H) 0.350 - 4.500 uIU/mL    Comment: Performed by a 3rd Generation assay with a functional sensitivity of <=0.01 uIU/mL. Performed at [facility], [place] [address] [place], [place] [address]   Glucose, capillary     Status: Abnormal   Collection Time: 08/18/19  4:48 PM  Result Value Ref Range   Glucose-Capillary 205 (H) 70 - 99 mg/dL  Glucose, capillary     Status: Abnormal   Collection Time: 08/18/19 10:02 PM  Result Value Ref Range   Glucose-Capillary 241 (H) 70 - 99 mg/dL   Comment 1 Notify RN   Basic metabolic panel     Status: Abnormal   Collection Time: 08/19/19  5:30 AM  Result Value Ref Range   Sodium 134 (L) 135 - 145 mmol/L   Potassium 3.4 (L) 3.5 - 5.1 mmol/L   Chloride 95 (L) 98 - 111 mmol/L   CO2 27 22 - 32 mmol/L   Glucose, Bld 190 (H) 70 - 99 mg/dL   BUN 14 8 - 23 mg/dL   Creatinine, Ser 2.92 (H) 0.61 - 1.24 mg/dL   Calcium 9.2 8.9 - 10.3 mg/dL   GFR calc non Af Amer 19 (L) >60 mL/min   GFR calc Af Amer 22 (L) >60 mL/min   Anion gap 12 5 - 15    Comment: Performed at [facility] [address] [place], [place] [address]  Magnesium     Status: None   Collection Time: 08/19/19  5:30 AM  Result Value Ref Range   Magnesium 1.9 1.7 - 2.4 mg/dL    Comment: Performed  at [facility], [place] [address] [place], [place] [address]  CBC     Status: Abnormal   Collection Time: 08/19/19  5:30 AM  Result Value Ref Range   WBC 7.0 4.0 - 10.5  K/uL   RBC 3.62 (L) 4.22 - 5.81 MIL/uL   Hemoglobin 11.1 (L) 13.0 - 17.0 g/dL   HCT 34.5 (L) 39.0 - 52.0 %   MCV 95.3 80.0 - 100.0 fL   MCH 30.7 26.0 - 34.0 pg   MCHC 32.2 30.0 - 36.0 g/dL   RDW 17.0 (H) 11.5 - 15.5 %   Platelets 164 150 - 400 K/uL   nRBC 0.0 0.0 - 0.2 %    Comment: Performed at [facility], [place] [address] [place], [place] [address]  Glucose, capillary     Status: Abnormal   Collection Time: 08/19/19  7:50 AM  Result Value Ref Range   Glucose-Capillary 186 (H) 70 - 99 mg/dL  Glucose, capillary     Status: Abnormal   Collection Time: 08/19/19 11:36 AM  Result Value Ref Range   Glucose-Capillary 156 (H) 70 - 99 mg/dL  Glucose, capillary     Status: Abnormal   Collection Time: 08/19/19  4:35 PM  Result Value Ref Range   Glucose-Capillary 215 (H) 70 - 99 mg/dL  Glucose, capillary     Status: Abnormal   Collection Time: 08/19/19  8:57 PM  Result Value Ref Range   Glucose-Capillary 201 (H) 70 - 99 mg/dL  Basic metabolic panel     Status: Abnormal   Collection Time: 08/20/19  5:32 AM  Result Value Ref Range   Sodium 132 (L) 135 - 145 mmol/L   Potassium 3.9 3.5 - 5.1 mmol/L   Chloride 92 (L) 98 - 111 mmol/L   CO2 24 22 - 32 mmol/L   Glucose, Bld 179 (H) 70 - 99 mg/dL   BUN 22 8 - 23 mg/dL   Creatinine, Ser 4.09 (H) 0.61 - 1.24 mg/dL    Comment: DELTA CHECK NOTED   Calcium 9.2 8.9 - 10.3 mg/dL   GFR calc non Af Amer 13 (L) >60 mL/min   GFR calc Af Amer 15 (L) >60 mL/min   Anion gap 16 (H) 5 - 15    Comment: Performed at [facility], [place] [address], [place], [place] [address]  Magnesium     Status: None   Collection Time: 08/20/19  5:32 AM  Result Value Ref Range   Magnesium 1.9 1.7 - 2.4 mg/dL    Comment: Performed at [facility] [address], [place], [place] [address]  CBC     Status: Abnormal   Collection Time: 08/20/19  5:32 AM  Result Value Ref Range   WBC 7.2 4.0 - 10.5 K/uL   RBC 3.65 (L) 4.22 - 5.81 MIL/uL   Hemoglobin 11.2  (L) 13.0 - 17.0 g/dL   HCT 34.4 (L) 39.0 - 52.0 %   MCV 94.2 80.0 - 100.0 fL   MCH 30.7 26.0 - 34.0 pg   MCHC 32.6 30.0 - 36.0 g/dL   RDW 17.1 (H) 11.5 - 15.5 %   Platelets 167 150 - 400 K/uL   nRBC 0.0 0.0 - 0.2 %    Comment: Performed at [facility], [place] [address], [place], [place] [address]  Glucose, capillary     Status: Abnormal   Collection Time: 08/20/19  6:57 AM  Result Value Ref Range   Glucose-Capillary 163 (  H) 70 - 99 mg/dL    ECG   N/A  Telemetry   Afib, V pacing, occ intrinsic beats, occ PVCs - Personally Reviewed  Radiology    No results found.  Cardiac Studies   ECHO: 08/15/2019  1. Left ventricular ejection fraction, by visual estimation, is 25 to 30%. The left ventricle has severely decreased function. Moderately increased left ventricular size. There is no left ventricular hypertrophy. There has been a decline in LVEF compared to prior study.  2. Multiple segmental abnormalities exist. The basal and mid inferolateral wall and basal inferior segment are akinetic. The entire apex, entire anterior septum, mid and apical inferior wall, and basal inferoseptal segment are hypokinetic. All remaining scored segments are normal.  3. Indeterminate diastolic filling due to E-A fusion pattern of LV diastolic filling.  4. Global right ventricle has moderately reduced systolic function.The right ventricular size is moderately enlarged. No increase in right ventricular wall thickness.  5. Left atrial size was moderately dilated.  6. Right atrial size was moderately dilated.  7. Moderate mitral annular calcification.  8. The mitral valve is degenerative. Moderate mitral valve regurgitation.  9. The tricuspid valve is grossly normal. Tricuspid valve regurgitation moderate. 10. Aortic valve mean gradient measures 8.0 mmHg. 11. Aortic valve regurgitation is trivial by color flow Doppler. 12. 29 mm Edwards Sapien 3 (TAVR) in aortic position. No obvious perivalvular  leak noted. 13. The pulmonic valve was not well visualized. Pulmonic valve regurgitation is not visualized by color flow Doppler. 14. Moderately elevated pulmonary artery systolic pressure. 15. The tricuspid regurgitant velocity is 3.15 m/s, and with an assumed right atrial pressure of 15 mmHg, the estimated right ventricular systolic pressure is moderately elevated at 54.7 mmHg. 16. A pacer wire is visualized. 17. The inferior vena cava is dilated in size with <50% respiratory variability, suggesting right atrial pressure of 15 mmHg.  Assessment   Active Problems:   Chronic combined systolic and diastolic CHF (congestive heart failure) (HCC)   Hypotension   Acute respiratory failure with hypoxia (HCC)   End-stage renal disease on hemodialysis (HCC)   Chronic hypotension   Acute systolic CHF (congestive heart failure) (HCC)   AF (paroxysmal atrial fibrillation) (HCC)   Complete heart block (HCC)   Presence of permanent cardiac pacemaker   S/P BKA (below knee amputation), left (HCC)   Diabetes mellitus type 2, uncontrolled, with complications (HCC)   Syncope and collapse   H/O aortic valve replacement   Thyroid nodule   Pressure injury of skin   ESRD (end stage renal disease) ([place])   Advanced care planning/counseling discussion   Goals of care, counseling/discussion   Palliative care by specialist   Plan   1. Perm Afib: - seen by EP, no procedures are appropriate right now due to ongoing infection. - also, Dr Curt Bears feels likelihood of maintaining SR is low, so will leave pt in Afib  2. LVD:  - could be ICM or NICM, BP precludes BB and ACE/ARB/Entresto. - Pt wonders if sx are from CAD, Pt had 70% Diag and 90% mCFX at cath 07/2018 plus chronically occluded RCA. MD advise if any other testing appropriate - he is on high-dose statin and Plavix - no appropriate for device upgrade/BiV  3. Syncope - pacer interrogation w/ no explanation for syncope - likely orthostatic    Length of Stay:  LOS: 5 days   Rosaria Ferries, PA-C 08/20/2019 9:56 AM Beeper [phone]

## 2019-08-20 NOTE — Progress Notes (Addendum)
PT Cancellation Note  Patient Details Name: Jeffrey [name] MRN: [id number] DOB: [birth year]   Cancelled Treatment:    Reason Eval/Treat Not Completed: Patient at procedure or test/unavailable - patient at HD  Spoke with CM, Wendi this morning regarding patient needing Hoyer lift at home. I see in notes that this has been ordered. Patient will have all equipment needs to return home with assistance from family. Patient to be discharged today. Will not require PT follow up here at this time.      Lenord Fralix 08/20/2019, 2:01 PM

## 2019-08-20 NOTE — Consult Note (Signed)
[place] Nurse wound follow up Wound type: Surgical (left AKA) Patient seen on Sunday for this wound, the contralateral LE was seen yesterday per the patient wishes.  Patient is followed by the outpatient [facility] when in community; will return to that site of care if able post discharge.  The [facility] is using Medihoney, a topical antimicrobial that is changed twice weekly.  We do not carry that product in house.  An alternate antimicrobial, xeroform gauze, has been ordered with daily dressing changes. The patient was transferred from ICU yesterday to the floor and his wife is not pleased with the topical care. Yesterday she preferred to perform the wound care herself My recommendations are: 1.  Continue the present POC and return to the preferred dressing upon discharge to home 2.  Allow the patient's wife to perform care if desired 3.  Consult with [facility] known to patient (or their NP) for assessment and further recommendations  The patient's declining health status impacting perfusion is noted.   I have requested that a photo of the wound be taken for the EMR  by the Attending hospitalist via the Secure Chat feature of the record.  [place] nursing team will not follow, but will remain available to this patient, the nursing and medical teams.  Please re-consult if needed. Thanks, Maudie Flakes, MSN, RN, [place], Arther Abbott  Pager# [phone]

## 2019-08-20 NOTE — Discharge Summary (Signed)
Physician Discharge Summary  Jeffrey [name] [id number] DOB: 1941/02/27 DOA: 08/14/2019  PCP: Scot Jun, FNP  Admit date: 08/14/2019 Discharge date: 08/20/2019  Admitted From: Home Disposition: Home  Recommendations for Outpatient Follow-up:  1. Follow up with PCP in 1-2 weeks (please check patient's T3 and T4 since he has elevated TSH in the hospital) 2. Follow with cardiology 3. Follow with outpatient hemodialysis 4. Please obtain BMP/CBC in one week 5. Please follow up on the following pending results:  Home Health: Yes Equipment/Devices: Oxygen concentrator  Discharge Condition: Stable CODE STATUS: Full code Diet recommendation: Cardiac  Subjective: Seen and examined.  He has no complaints.  He states that he is back to his baseline and wants to go home today.  Brief/Interim Summary: 78 yo M WITH PMH ESRD (M/T/T/Sa HD), CVA, CAD, CHF, s/p TAVR, A fib, DM II, chronic respiratory failure/COPD on 2 L home oxygen, left BKA who presented to ED 9/18 after noted by daughter to exhibit decreased level of consciousness. Daughter reported that the patient had several "episodes" of SOB and intermittent chest pain. During one such episode, he exhibited AMS, decreased level of consciousness and daughter dispatched EMS. Patient transported to ED, placed on NRB. Denied productive cough, rhinitis, wheezing, fever, chills, nightsweats. Chest pain was mild 3/10 intermittent without identifiable factors which improve or worsen pain. Patient noted to be hyperkalemic 6.1 for which bicarb, insulin/dextrose, albuterol given.  He had not missed any dialysis session. Patient and daughter reference recent blood clot in fistula. Reportedly, this was resolved.  CT chest angiogram was ordered which ruled out PE.  It showed left-sided thyroid nodule measuring 2.7 cm. Patient's SBP while soft in ED, 80s-100. PCCM consulted for admission and nephrology was consulted and CRRT was started and subsequently  patient's respiratory status improved and he was back to 2 L of nasal oxygen which is his baseline so patient was transferred to medical floor and was transferred under [facility] care on 08/19/2019.  Since there was also a concern of possible syncope so cardiology was consulted.  Patient's pacemaker was interrogated and he was found to be in atrial fibrillation throughout since more than a year.  He had couple of episodes of NSVT but no persistent SVT.  Initially cardiology was thinking about possible cardioversion but then they decided not to do that and instead recommended continuing his home medications.  He was seen by wound care for his left stump wound.  He was also seen by PMT but patient decided to continue aggressive care, hemodialysis and admission to the hospital if needed and also did not want to change his CODE STATUS to DNR.  When seen this morning, patient was sitting at the edge of the bed and wanted to go home immediately.  He was then convinced to have his dialysis and be seen by cardiology before going to home.  Daughter called me and asked me several questions regarding his A. fib and cardiac plans.  I referred her to cardiologist and also called patient's primary nurse and conveyed the message.  Patient is being discharged in stable condition today.  Of note, patient had 2.7 cm nodule and his TSH was slightly elevated at 5.97.  I ordered T3-T4 however patient refused blood draw for this.  Discharge Diagnoses:  Active Problems:   Chronic combined systolic and diastolic CHF (congestive heart failure) (HCC)   Hypotension   Acute respiratory failure with hypoxia (HCC)   End-stage renal disease on hemodialysis (HCC)   Chronic hypotension  Acute systolic CHF (congestive heart failure) (HCC)   AF (paroxysmal atrial fibrillation) (HCC)   Complete heart block (HCC)   Presence of permanent cardiac pacemaker   S/P BKA (below knee amputation), left (HCC)   Diabetes mellitus type 2, uncontrolled,  with complications (HCC)   Syncope and collapse   H/O aortic valve replacement   Thyroid nodule   Pressure injury of skin   ESRD (end stage renal disease) ([place])   Advanced care planning/counseling discussion   Goals of care, counseling/discussion   Palliative care by specialist    Discharge Instructions  Discharge Instructions    Discharge patient   Complete by: As directed    Discharge disposition: 06-Home-Health Care Svc   Discharge patient date: 08/20/2019     Allergies as of 08/20/2019      Reactions   Byetta 10 Mcg Pen [exenatide] Diarrhea, Nausea And Vomiting   Telmisartan Other (See Comments)   Unknown reaction - reported by [facility]   Codeine Itching   Minor reaction   Coumadin [warfarin Sodium] Rash   (wife states coumadin was stopped but rash did not disappear 02/21/19)      Medication List    STOP taking these medications   vancomycin 125 MG capsule Commonly known as: VANCOCIN     TAKE these medications   acetaminophen 500 MG tablet Commonly known as: TYLENOL Take 1 tablet (500 mg total) by mouth every 6 (six) hours as needed for mild pain or headache (pain). What changed:   how much to take  reasons to take this   albuterol 108 (90 Base) MCG/ACT inhaler Commonly known as: VENTOLIN HFA Inhale 2 puffs into the lungs every 4 (four) hours as needed for wheezing or shortness of breath.   albuterol (2.5 MG/3ML) 0.083% nebulizer solution Commonly known as: PROVENTIL Take 3 mLs (2.5 mg total) by nebulization every 6 (six) hours as needed for wheezing or shortness of breath.   ALPRAZolam 0.25 MG tablet Commonly known as: XANAX Take 1 tablet (0.25 mg total) by mouth 2 (two) times daily as needed for anxiety.   apixaban 2.5 MG Tabs tablet Commonly known as: ELIQUIS Take 1 tablet (2.5 mg total) by mouth 2 (two) times daily.   atorvastatin 80 MG tablet Commonly known as: LIPITOR Take 1 tablet (80 mg total) by mouth daily at 6 PM.    budesonide-formoterol 160-4.5 MCG/ACT inhaler Commonly known as: SYMBICORT Inhale 2 puffs into the lungs 2 (two) times daily as needed.   cinacalcet 30 MG tablet Commonly known as: SENSIPAR Take 30 mg by mouth See admin instructions. Take one tablet (30 mg) by mouth on Monday, Tuesday, Thursday, Saturday after dialysis   clopidogrel 75 MG tablet Commonly known as: PLAVIX Take 1 tablet (75 mg total) by mouth daily.   DIALYVITE 800 WITH ZINC 0.8 MG Tabs Take 1 tablet by mouth daily.   doxercalciferol 4 MCG/2ML injection Commonly known as: HECTOROL Inject 0.5 mLs (1 mcg total) into the vein Every Tuesday,Thursday,and Saturday with dialysis.   hydrOXYzine 25 MG tablet Commonly known as: ATARAX/VISTARIL Take 1 tablet (25 mg total) by mouth every 8 (eight) hours as needed for anxiety.   insulin detemir 100 UNIT/ML injection Commonly known as: LEVEMIR Inject 0.2 mLs (20 Units total) into the skin at bedtime. What changed: Another medication with the same name was changed. Make sure you understand how and when to take each.   Levemir FlexTouch 100 UNIT/ML Pen Generic drug: Insulin Detemir INJECT 48 UNITS TOTAL INTO THE SKIN  AT BEDTIME. What changed: See the new instructions.   Insulin Pen Needle 31G X 6 MM Misc at bedtime.   lidocaine-prilocaine cream Commonly known as: EMLA Apply 1 application topically as needed (dialysis days).   loperamide 2 MG capsule Commonly known as: IMODIUM Take 2 mg by mouth as needed.   loratadine 10 MG tablet Commonly known as: CLARITIN Take 10 mg by mouth daily.   Melatonin 5 MG Tabs Take 5-10 mg by mouth at bedtime as needed (sleep).   metoprolol tartrate 25 MG tablet Commonly known as: LOPRESSOR Take 0.5 tablets (12.5 mg total) by mouth See admin instructions. Take 1/2 tablet (12.5 mg) by mouth on Sunday, Monday, Wednesday, Friday mornings (non-dialysis days), take 1/2 tablet (12.5 mg) every evening What changed: additional  instructions   midodrine 10 MG tablet Commonly known as: PROAMATINE Take 10 mg by mouth See admin instructions. Take one tablet (10 mg) by mouth 30 minutes before dialysis on Monday, Tuesday, Thursday, Saturday What changed: Another medication with the same name was changed. Make sure you understand how and when to take each.   midodrine 2.5 MG tablet Commonly known as: PROAMATINE Take 1 tablet (2.5 mg total) by mouth 3 (three) times daily with meals. Patient should continue to take 10 mg in the morning prior to hemodialysis sessions. What changed:   when to take this  additional instructions   mirtazapine 15 MG tablet Commonly known as: REMERON Take 7.5 mg by mouth at bedtime. For appetite/sleep   nitroGLYCERIN 0.4 MG SL tablet Commonly known as: Nitrostat Place 1 tablet (0.4 mg total) under the tongue every 5 (five) minutes as needed for chest pain.   oxyCODONE-acetaminophen 5-325 MG tablet Commonly known as: PERCOCET/ROXICET Take 1-2 tablets by mouth every 6 (six) hours as needed for severe pain.   OXYGEN Inhale 3 L into the lungs continuous. 2-3 lpm 24/7   pantoprazole 40 MG tablet Commonly known as: PROTONIX Take 1 tablet (40 mg total) by mouth daily.   polyethylene glycol powder 17 GM/SCOOP powder Commonly known as: GLYCOLAX/MIRALAX Take 17 g by mouth 2 (two) times daily.   sevelamer carbonate 800 MG tablet Commonly known as: RENVELA Take 1,600-2,400 mg by mouth See admin instructions. Take 3 tablets (2400 mg) by mouth with meals and 2 tablets (1600 mg) with snacks            Durable Medical Equipment  (From admission, onward)         Start     Ordered   08/20/19 1325  For home use only DME oxygen  Once    Comments: Patient needs portable oxygen concentrator and evaluation for portable oxygen concentrator.  Question Answer Comment  Length of Need Lifetime   Mode or (Route) Nasal cannula   Liters per Minute 3   Frequency Continuous (stationary and  portable oxygen unit needed)   Oxygen conserving device Yes   Oxygen delivery system Gas      08/20/19 1329   08/20/19 1230  For home use only DME Hospital bed  Once    Question Answer Comment  Length of Need Lifetime   The above medical condition requires: Patient requires the ability to reposition frequently   Bed type Semi-electric   Hoyer Lift Yes      09 /24/20 1230         Follow-up Information    Scot Jun, FNP Follow up in 1 week(s).   Specialty: Family Medicine Contact information: [place]  [place] [address] [phone]        Deboraha Sprang, MD .   Specialty: Cardiology Contact information: [phone] [address] [address] [facility] Follow up.   Why: [employer] is the provider for the hospital bed and hoyer lift.  Contact information: [place] [id number] [address] [facility] Follow up.   Why: Respiratory therapist at [facility] will contact you about getting portable concentrator Contact information: [place] [address] [phone]          Allergies  Allergen Reactions  . Byetta 10 Mcg Pen [Exenatide] Diarrhea and Nausea And Vomiting  . Telmisartan Other (See Comments)    Unknown reaction - reported by [facility]  . Codeine Itching    Minor reaction  . Coumadin [Warfarin Sodium] Rash    (wife states coumadin was stopped but rash did not disappear 02/21/19)    Consultations: Cardiology/nephrology/palliative care   Procedures/Studies: Dg Chest 1 View  Result Date: 07/22/2019 CLINICAL DATA:  78 year old male with CHF. Fall and right hip pain. EXAM: CHEST  1 VIEW COMPARISON:  Chest radiograph dated 04/10/2019 FINDINGS: The lungs are clear. There is no pleural effusion or pneumothorax. Stable cardiomegaly. Atherosclerotic calcification of the aorta. Aortic valve repair. Right pectoral pacemaker device.  No acute osseous pathology. IMPRESSION: No acute cardiopulmonary process.  Cardiomegaly. Electronically Signed   By: Anner Crete M.D.   On: 07/22/2019 00:20   Ct Angio Chest Pe W And/or Wo Contrast  Result Date: 08/15/2019 CLINICAL DATA:  Shortness of breath EXAM: CT ANGIOGRAPHY CHEST WITH CONTRAST TECHNIQUE: Multidetector CT imaging of the chest was performed using the standard protocol during bolus administration of intravenous contrast. Multiplanar CT image reconstructions and MIPs were obtained to evaluate the vascular anatomy. CONTRAST:  6mL OMNIPAQUE IOHEXOL 350 MG/ML SOLN COMPARISON:  CT dated 07/21/2015. FINDINGS: Cardiovascular: Evaluation is severely limited by contrast bolus timing. Given this limitation, no large centrally located pulmonary embolus was detected. Detection of smaller segmental and subsegmental pulmonary emboli is significantly limited. The main pulmonary artery is dilated measuring approximately 4.5 cm in diameter.The cardiac size is enlarged. The patient is status post prior TAVR. Coronary artery calcifications and thoracic aortic calcifications are noted. There are multiple chest wall collaterals suggestion stenosis of the right brachiocephalic vein likely secondary to the presence of a right-sided dual chamber pacemaker. There is reflux of contrast into the IVC. Mediastinum/Nodes: --No mediastinal or hilar lymphadenopathy. --No axillary lymphadenopathy. --No supraclavicular lymphadenopathy. --there is a left-sided thyroid nodule measuring approximately 2.7 cm. --The esophagus is unremarkable Lungs/Pleura: There is atelectasis at the right lung base. There is a trace right-sided pleural effusion. There is no focal infiltrate. Upper Abdomen: There is a 2.3 cm left-sided adrenal nodule which has decreased in size from prior CT in 2016. The patient is status post prior cholecystectomy. Musculoskeletal: No chest wall abnormality. No acute or significant osseous findings. Review of  the MIP images confirms the above findings. IMPRESSION: 1. The study is limited by contrast bolus timing. Given this limitation, no large centrally located pulmonary embolus was detected. Detection of smaller segmental and subsegmental pulmonary emboli is significantly limited. 2. Dilated main pulmonary artery which can be seen in patients with elevated pulmonary artery pressures. 3. Cardiomegaly with reflux of contrast into the IVC, suggesting right heart failure. 4. Trace right pleural effusion. 5. Left-sided thyroid nodule measuring  approximately 2.7 cm. Further evaluation with a nonemergent outpatient thyroid ultrasound is recommended. Aortic Atherosclerosis (ICD10-I70.0). Electronically Signed   By: Constance Holster M.D.   On: 08/15/2019 00:47   Ct Hip Right Wo Contrast  Result Date: 07/22/2019 CLINICAL DATA:  Fall with hip trauma EXAM: CT OF THE RIGHT HIP WITHOUT CONTRAST TECHNIQUE: Multidetector CT imaging of the right hip was performed according to the standard protocol. Multiplanar CT image reconstructions were also generated. COMPARISON:  Radiograph 07/22/2019, CT 06/17/2017 FINDINGS: Bones/Joint/Cartilage Heterogenous attenuation within the right hip osseous structures with lucency and sclerosis. No definitive fracture is identified. Right femoral head projects in joint. The pubic rami appear intact. No large hip effusion. Ligaments Suboptimally assessed by CT. Muscles and Tendons Atrophy of the right hip muscles. Soft tissues Moderate subcutaneous edema overlying the right hip. Cystic density paralleling the left aspect of the bladder, questionable for large diverticulum but unchanged as compared with CT from 2018. Fat within the inguinal canals. IMPRESSION: 1. No definite acute osseous abnormality of the right hip. MRI follow-up if clinical suspicion for fracture remains high 2. Heterogenous mineralization of the right hip, possible osteopenia 3. Edema within the subcutaneous fat of the right hip  laterally consistent with a contusion Electronically Signed   By: Donavan Foil M.D.   On: 07/22/2019 01:27   Dg Chest Portable 1 View  Result Date: 08/14/2019 CLINICAL DATA:  Shortness of breath EXAM: PORTABLE CHEST 1 VIEW COMPARISON:  07/21/2019 FINDINGS: Right-sided pacing device as before. Enlarged cardiomediastinal silhouette with vascular congestion. Aortic atherosclerosis. No pleural effusion or pneumothorax. IMPRESSION: Cardiomegaly with vascular congestion. Electronically Signed   By: Donavan Foil M.D.   On: 08/14/2019 20:29   Dg Hip Unilat W Or Wo Pelvis 2-3 Views Right  Result Date: 07/22/2019 CLINICAL DATA:  [place] on right hip EXAM: DG HIP (WITH OR WITHOUT PELVIS) 2-3V RIGHT COMPARISON:  11/16/2010, CT abdomen pelvis 11/14/2017 FINDINGS: Extensive vascular calcifications. No acute displaced fracture or malalignment. Minimal step-off deformity at the left femoral neck. IMPRESSION: No acute osseous abnormality of the right hip. Minimal step-off deformity at the left femoral neck, possibly positional. If there is left hip pain suggest dedicated left hip radiographs for further evaluation. Electronically Signed   By: Donavan Foil M.D.   On: 07/22/2019 00:20      Discharge Exam: Vitals:   08/20/19 1230 08/20/19 1300  BP: (!) 86/40 (!) 80/42  Pulse: (!) 54 (!) 54  Resp:    Temp:    SpO2:     Vitals:   08/20/19 1130 08/20/19 1200 08/20/19 1230 08/20/19 1300  BP: (!) 87/41 (!) 80/42 (!) 86/40 (!) 80/42  Pulse: (!) 55 (!) 56 (!) 54 (!) 54  Resp: 13 16    Temp:      TempSrc:      SpO2: 100% 100%    Weight:      Height:        General: Pt is alert, awake, not in acute distress Cardiovascular: RRR, S1/S2 +, no rubs, no gallops Respiratory: CTA bilaterally, no wheezing, no rhonchi Abdominal: Soft, NT, ND, bowel sounds + Extremities: Left BKA    The results of significant diagnostics from this hospitalization (including imaging, microbiology, ancillary and laboratory) are  listed below for reference.     Microbiology: Recent Results (from the past 240 hour(s))  Blood culture (routine x 2)     Status: None   Collection Time: 08/14/19  8:04 PM   Specimen: BLOOD RIGHT HAND  Result  Value Ref Range Status   Specimen Description BLOOD RIGHT HAND  Final   Special Requests   Final    BOTTLES DRAWN AEROBIC AND ANAEROBIC Blood Culture results may not be optimal due to an inadequate volume of blood received in culture bottles   Culture   Final    NO GROWTH 5 DAYS Performed at [facility], [place] [address], [place], [place] [address]    Report Status 08/19/2019 FINAL  Final  SARS Coronavirus 2 [facility] order, Performed in [facility] hospital lab) Nasopharyngeal Nasopharyngeal Swab     Status: None   Collection Time: 08/14/19  8:16 PM   Specimen: Nasopharyngeal Swab  Result Value Ref Range Status   SARS Coronavirus 2 NEGATIVE NEGATIVE Final    Comment: (NOTE) If result is NEGATIVE SARS-CoV-2 target nucleic acids are NOT DETECTED. The SARS-CoV-2 RNA is generally detectable in upper and lower  respiratory specimens during the acute phase of infection. The lowest  concentration of SARS-CoV-2 viral copies this assay can detect is 250  copies / mL. A negative result does not preclude SARS-CoV-2 infection  and should not be used as the sole basis for treatment or other  patient management decisions.  A negative result may occur with  improper specimen collection / handling, submission of specimen other  than nasopharyngeal swab, presence of viral mutation(s) within the  areas targeted by this assay, and inadequate number of viral copies  (<250 copies / mL). A negative result must be combined with clinical  observations, patient history, and epidemiological information. If result is POSITIVE SARS-CoV-2 target nucleic acids are DETECTED. The SARS-CoV-2 RNA is generally detectable in upper and lower  respiratory specimens dur ing the acute phase of infection.   Positive  results are indicative of active infection with SARS-CoV-2.  Clinical  correlation with patient history and other diagnostic information is  necessary to determine patient infection status.  Positive results do  not rule out bacterial infection or co-infection with other viruses. If result is PRESUMPTIVE POSTIVE SARS-CoV-2 nucleic acids MAY BE PRESENT.   A presumptive positive result was obtained on the submitted specimen  and confirmed on repeat testing.  While 2019 novel coronavirus  (SARS-CoV-2) nucleic acids may be present in the submitted sample  additional confirmatory testing may be necessary for epidemiological  and / or clinical management purposes  to differentiate between  SARS-CoV-2 and other Sarbecovirus currently known to infect humans.  If clinically indicated additional testing with an alternate test  methodology [phone]) is advised. The SARS-CoV-2 RNA is generally  detectable in upper and lower respiratory sp ecimens during the acute  phase of infection. The expected result is Negative. Fact Sheet for Patients:  StrictlyIdeas.no Fact Sheet for Healthcare Providers: BankingDealers.co.za This test is not yet approved or cleared by the [place] FDA and has been authorized for detection and/or diagnosis of SARS-CoV-2 by FDA under an Emergency Use Authorization (EUA).  This EUA will remain in effect (meaning this test can be used) for the duration of the COVID-19 declaration under Section 564(b)(1) of the Act, 21 U.S.C. section 360bbb-3(b)(1), unless the authorization is terminated or revoked sooner. Performed at [facility], [place] [address] [place], [place] [address]   Blood culture (routine x 2)     Status: None   Collection Time: 08/15/19  4:25 AM   Specimen: BLOOD  Result Value Ref Range Status   Specimen Description BLOOD RIGHT ANTECUBITAL  Final   Special Requests   Final  BOTTLES DRAWN  AEROBIC AND ANAEROBIC Blood Culture adequate volume   Culture   Final    NO GROWTH 5 DAYS Performed at [facility], [place] [address], [place], [place] [address]    Report Status 08/20/2019 FINAL  Final  MRSA PCR Screening     Status: None   Collection Time: 08/15/19  5:00 AM   Specimen: Nasopharyngeal  Result Value Ref Range Status   MRSA by PCR NEGATIVE NEGATIVE Final    Comment:        The GeneXpert MRSA Assay (FDA approved for NASAL specimens only), is one component of a comprehensive MRSA colonization surveillance program. It is not intended to diagnose MRSA infection nor to guide or monitor treatment for MRSA infections. Performed at [facility], [place] [address], [place], [place] [address]      Labs: BNP (last 3 results) Recent Labs    11/29/18 1854 08/14/19 2016  BNP 792.7* [id number]*   Basic Metabolic Panel: Recent Labs  Lab 08/15/19 0144  08/16/19 0417 08/17/19 0727 08/18/19 0335 08/19/19 0530 08/20/19 0532  NA 135   < > 133* 132* 132* 134* 132*  K 5.8*   < > 4.6 4.6 3.6 3.4* 3.9  CL 95*   < > 94* 92* 94* 95* 92*  CO2 23   < > 23 22 27 27 24   GLUCOSE 146*   < > 156* 143* 225* 190* 179*  BUN 38*   < > 22 33* 20 14 22   CREATININE 5.27*   < > 3.60* 4.48* 3.47* 2.92* 4.09*  CALCIUM 9.0   < > 9.0 9.2 8.6* 9.2 9.2  MG 1.8  --  1.8  --  1.8 1.9 1.9  PHOS 6.8*  --  5.4* 6.0*  --   --  4.6   < > = values in this interval not displayed.   Liver Function Tests: Recent Labs  Lab 08/14/19 2016 08/15/19 0144 08/16/19 0417 08/17/19 0727  AST 33 36  --   --   ALT 20 22  --   --   ALKPHOS 135* 138*  --   --   BILITOT 2.0* 2.1*  --   --   PROT 6.4* 6.4*  --   --   ALBUMIN 3.0* 3.2* 3.0* 3.0*   No results for input(s): LIPASE, AMYLASE in the last 168 hours. No results for input(s): AMMONIA in the last 168 hours. CBC: Recent Labs  Lab 08/14/19 2016  08/16/19 0417 08/17/19 0727 08/18/19 0335 08/19/19 0530 08/20/19 0532  WBC 6.9   < > 8.2 10.1  8.5 7.0 7.2  NEUTROABS 4.7  --   --   --   --   --   --   HGB 11.9*   < > 11.4* 11.5* 11.3* 11.1* 11.2*  HCT 37.9*   < > 34.7* 36.4* 34.3* 34.5* 34.4*  MCV 98.4   < > 94.0 94.3 93.5 95.3 94.2  PLT 172   < > 187 217 169 164 167   < > = values in this interval not displayed.   Cardiac Enzymes: No results for input(s): CKTOTAL, CKMB, CKMBINDEX, TROPONINI in the last 168 hours. BNP: Invalid input(s): POCBNP CBG: Recent Labs  Lab 08/19/19 0750 08/19/19 1136 08/19/19 1635 08/19/19 2057 08/20/19 0657  GLUCAP 186* 156* 215* 201* 163*   D-Dimer No results for input(s): DDIMER in the last 72 hours. Hgb A1c No results for input(s): HGBA1C in the last 72 hours. Lipid Profile Recent  Labs    08/18/19 1459  CHOL 60  HDL 17*  LDLCALC 26  TRIG 84  CHOLHDL 3.5   Thyroid function studies Recent Labs    08/18/19 1459  TSH 5.976*   Anemia work up No results for input(s): VITAMINB12, FOLATE, FERRITIN, TIBC, IRON, RETICCTPCT in the last 72 hours. Urinalysis    Component Value Date/Time   COLORURINE YELLOW 06/17/2017 [place] 06/17/2017 0547   LABSPEC 1.010 06/17/2017 0547   PHURINE 6.0 06/17/2017 0547   GLUCOSEU >=500 (A) 06/17/2017 0547   HGBUR SMALL (A) 06/17/2017 0547   BILIRUBINUR NEGATIVE 06/17/2017 0547   KETONESUR NEGATIVE 06/17/2017 0547   PROTEINUR 100 (A) 06/17/2017 0547   UROBILINOGEN 0.2 09/28/2014 2257   NITRITE NEGATIVE 06/17/2017 0547   LEUKOCYTESUR TRACE (A) 06/17/2017 0547   Sepsis Labs Invalid input(s): PROCALCITONIN,  WBC,  LACTICIDVEN Microbiology Recent Results (from the past 240 hour(s))  Blood culture (routine x 2)     Status: None   Collection Time: 08/14/19  8:04 PM   Specimen: BLOOD RIGHT HAND  Result Value Ref Range Status   Specimen Description BLOOD RIGHT HAND  Final   Special Requests   Final    BOTTLES DRAWN AEROBIC AND ANAEROBIC Blood Culture results may not be optimal due to an inadequate volume of blood received in  culture bottles   Culture   Final    NO GROWTH 5 DAYS Performed at [facility], [place] [address] [place], [place] [address]    Report Status 08/19/2019 FINAL  Final  SARS Coronavirus 2 [facility] order, Performed in [facility] hospital lab) Nasopharyngeal Nasopharyngeal Swab     Status: None   Collection Time: 08/14/19  8:16 PM   Specimen: Nasopharyngeal Swab  Result Value Ref Range Status   SARS Coronavirus 2 NEGATIVE NEGATIVE Final    Comment: (NOTE) If result is NEGATIVE SARS-CoV-2 target nucleic acids are NOT DETECTED. The SARS-CoV-2 RNA is generally detectable in upper and lower  respiratory specimens during the acute phase of infection. The lowest  concentration of SARS-CoV-2 viral copies this assay can detect is 250  copies / mL. A negative result does not preclude SARS-CoV-2 infection  and should not be used as the sole basis for treatment or other  patient management decisions.  A negative result may occur with  improper specimen collection / handling, submission of specimen other  than nasopharyngeal swab, presence of viral mutation(s) within the  areas targeted by this assay, and inadequate number of viral copies  (<250 copies / mL). A negative result must be combined with clinical  observations, patient history, and epidemiological information. If result is POSITIVE SARS-CoV-2 target nucleic acids are DETECTED. The SARS-CoV-2 RNA is generally detectable in upper and lower  respiratory specimens dur ing the acute phase of infection.  Positive  results are indicative of active infection with SARS-CoV-2.  Clinical  correlation with patient history and other diagnostic information is  necessary to determine patient infection status.  Positive results do  not rule out bacterial infection or co-infection with other viruses. If result is PRESUMPTIVE POSTIVE SARS-CoV-2 nucleic acids MAY BE PRESENT.   A presumptive positive result was obtained on the submitted specimen   and confirmed on repeat testing.  While 2019 novel coronavirus  (SARS-CoV-2) nucleic acids may be present in the submitted sample  additional confirmatory testing may be necessary for epidemiological  and / or clinical management purposes  to differentiate between  SARS-CoV-2 and other Sarbecovirus  currently known to infect humans.  If clinically indicated additional testing with an alternate test  methodology [phone]) is advised. The SARS-CoV-2 RNA is generally  detectable in upper and lower respiratory sp ecimens during the acute  phase of infection. The expected result is Negative. Fact Sheet for Patients:  StrictlyIdeas.no Fact Sheet for Healthcare Providers: BankingDealers.co.za This test is not yet approved or cleared by the [place] FDA and has been authorized for detection and/or diagnosis of SARS-CoV-2 by FDA under an Emergency Use Authorization (EUA).  This EUA will remain in effect (meaning this test can be used) for the duration of the COVID-19 declaration under Section 564(b)(1) of the Act, 21 U.S.C. section 360bbb-3(b)(1), unless the authorization is terminated or revoked sooner. Performed at [facility], [place] [address], [place], [place] [address]   Blood culture (routine x 2)     Status: None   Collection Time: 08/15/19  4:25 AM   Specimen: BLOOD  Result Value Ref Range Status   Specimen Description BLOOD RIGHT ANTECUBITAL  Final   Special Requests   Final    BOTTLES DRAWN AEROBIC AND ANAEROBIC Blood Culture adequate volume   Culture   Final    NO GROWTH 5 DAYS Performed at [facility], [place] [address] [place], [place] [address]    Report Status 08/20/2019 FINAL  Final  MRSA PCR Screening     Status: None   Collection Time: 08/15/19  5:00 AM   Specimen: Nasopharyngeal  Result Value Ref Range Status   MRSA by PCR NEGATIVE NEGATIVE Final    Comment:        The GeneXpert MRSA Assay (FDA approved  for NASAL specimens only), is one component of a comprehensive MRSA colonization surveillance program. It is not intended to diagnose MRSA infection nor to guide or monitor treatment for MRSA infections. Performed at [facility], [place] [address] [place], [place] [address]      Time coordinating discharge: Over 30 minutes  SIGNED:   Darliss Cheney, MD  [facility] 08/20/2019, 1:58 PM Pager [phone]  If 7PM-7AM, please contact night-coverage [email or web address] Password [email or web address]

## 2019-08-20 NOTE — Progress Notes (Addendum)
DISCHARGE NOTE  Jeffrey [name] to be discharged Home tv per MD order. Patient verbalized understanding.  Skin clean, dry and intact without evidence of skin break down, no evidence of skin tears noted. IV catheter discontinued intact. Site without signs and symptoms of complications. Dressing and pressure applied. Pt denies pain at the site currently. No complaints noted.  Patient free of lines, drains, and wounds.   Discharge packet assembled. An After Visit Summary (AVS) was printed and given to the patient/daughter. Patient on home O2 at 3L nasal cannula, escorted via wheelchair and discharged to home via private auto.   Babs Sciara, RN

## 2019-08-20 NOTE — Progress Notes (Signed)
[facility] NEPHROLOGY PROGRESS NOTE  Assessment/ Plan: Pt is a 78 y.o. yo male ESRD on HD, AS s/p TAVR, left BKA, chronic hypertension admitted with hypoxia and chest pain.  Dialysis: [facility] MTTS   95.5kg   F200  2/2.25 bath  Hep none  LUE AVF    Hectorol 1 mg q rx   Venofer 50 mg q week   No ESA  #SOB/hypoxia due to volume overload.  This is a recurrent problem complicated by hypotension. Receiving midodrine before and during dialysis.  He is clinically much improved.  # ESRD: doing MTTS schedule.  Plan for dialysis today.  Postdialysis weight 96.2 KG.  # Anemia: Hemoglobin acceptable.  No ESA.  # Secondary hyperparathyroidism: resume Hectorol and binders.  Check phosphorus level.  #Hypotension/volume: Continue midodrine.  Has chronic low blood pressure.  #Dialysis Access: Recent fistulogram.  #PAD status post left BKA   Subjective: Seen and examined.  He feels good and eager to go home today.  Denies nausea, vomiting, chest pain, shortness of breath.  On nasal cannula 2 L.  Objective Vital signs in last 24 hours: Vitals:   08/19/19 2059 08/20/19 0610 08/20/19 0835 08/20/19 0922  BP: (!) 88/48 (!) 93/22    Pulse: (!) 54 (!) 56    Resp: 19 18    Temp: 98 F (36.7 C) 98 F (36.7 C)  98 F (36.7 C)  TempSrc:  Oral  Oral  SpO2: 97% 98% 97% 98%  Weight:      Height:       Weight change:   Intake/Output Summary (Last 24 hours) at 08/20/2019 0926 Last data filed at 08/20/2019 0610 Gross per 24 hour  Intake 360 ml  Output 0 ml  Net 360 ml       Labs: Basic Metabolic Panel: Recent Labs  Lab 08/15/19 0144  08/16/19 0417 08/17/19 0727 08/18/19 0335 08/19/19 0530 08/20/19 0532  NA 135   < > 133* 132* 132* 134* 132*  K 5.8*   < > 4.6 4.6 3.6 3.4* 3.9  CL 95*   < > 94* 92* 94* 95* 92*  CO2 23   < > 23 22 27 27 24   GLUCOSE 146*   < > 156* 143* 225* 190* 179*  BUN 38*   < > 22 33* 20 14 22   CREATININE 5.27*   < > 3.60* 4.48* 3.47* 2.92* 4.09*   CALCIUM 9.0   < > 9.0 9.2 8.6* 9.2 9.2  PHOS 6.8*  --  5.4* 6.0*  --   --   --    < > = values in this interval not displayed.   Liver Function Tests: Recent Labs  Lab 08/14/19 2016 08/15/19 0144 08/16/19 0417 08/17/19 0727  AST 33 36  --   --   ALT 20 22  --   --   ALKPHOS 135* 138*  --   --   BILITOT 2.0* 2.1*  --   --   PROT 6.4* 6.4*  --   --   ALBUMIN 3.0* 3.2* 3.0* 3.0*   No results for input(s): LIPASE, AMYLASE in the last 168 hours. No results for input(s): AMMONIA in the last 168 hours. CBC: Recent Labs  Lab 08/14/19 2016  08/16/19 0417 08/17/19 0727 08/18/19 0335 08/19/19 0530 08/20/19 0532  WBC 6.9   < > 8.2 10.1 8.5 7.0 7.2  NEUTROABS 4.7  --   --   --   --   --   --  HGB 11.9*   < > 11.4* 11.5* 11.3* 11.1* 11.2*  HCT 37.9*   < > 34.7* 36.4* 34.3* 34.5* 34.4*  MCV 98.4   < > 94.0 94.3 93.5 95.3 94.2  PLT 172   < > 187 217 169 164 167   < > = values in this interval not displayed.   Cardiac Enzymes: No results for input(s): CKTOTAL, CKMB, CKMBINDEX, TROPONINI in the last 168 hours. CBG: Recent Labs  Lab 08/19/19 0750 08/19/19 1136 08/19/19 1635 08/19/19 2057 08/20/19 0657  GLUCAP 186* 156* 215* 201* 163*    Iron Studies: No results for input(s): IRON, TIBC, TRANSFERRIN, FERRITIN in the last 72 hours. Studies/Results: No results found.  Medications: Infusions:   Scheduled Medications: . apixaban  2.5 mg Oral BID  . atorvastatin  80 mg Oral q1800  . Chlorhexidine Gluconate Cloth  6 each Topical Q0600  . Chlorhexidine Gluconate Cloth  6 each Topical Q0600  . clopidogrel  75 mg Oral Daily  . doxercalciferol  1 mcg Intravenous Q M,W,F-HD  . fluticasone furoate-vilanterol  1 puff Inhalation Daily  . insulin aspart  0-5 Units Subcutaneous QHS  . insulin aspart  0-9 Units Subcutaneous TID WC  . mouth rinse  15 mL Mouth Rinse BID  . midodrine  10 mg Oral TID WC  . pantoprazole  40 mg Oral Daily  . pneumococcal 23 valent vaccine  0.5 mL  Intramuscular Tomorrow-1000  . sevelamer carbonate  1,600 mg Oral TID WC    have reviewed scheduled and prn medications.  Physical Exam: unchanged General:NAD, comfortable, pleasant Heart: Irregular heart rate, s1s2 nl Lungs: Clear bilateral, no wheezing Abdomen:soft, Non-tender, non-distended Extremities: Left BKA, no edema. Dialysis Access: Left upper extremity AV fistula Neurology: Alert, awake and following commands  Emilly Lavey Tanna Furry 08/20/2019,9:26 AM  LOS: 5 days  Pager: [phone]

## 2019-08-20 NOTE — TOC Transition Note (Signed)
Transition of Care [facility]) - CM/SW Discharge Note   Patient Details  Name: RAINER SAYARATH MRN: [id number] Date of Birth: [birth year]  [facility]) CM/SW Contact:  Bartholomew Crews, RN Phone Number: [phone] 08/20/2019, 2:20 PM   Clinical Narrative:    Damaris Schooner with [name] on the phone. Arrangements have been made for hospital bed to be delivered to the home tomorrow by [facility]. Family still wants to bring patient home today after hemodialysis. [facility] contacted about portable oxygen and order signed by MD. [place] contacted for community palliative care - advised that daughter is the point of contact. Bedside RN aware of arrangements and will notify family when ready to discharge. No further TOC needs identified at this time.    Final next level of care: Home/Self Care Barriers to Discharge: No Barriers Identified   Patient Goals and CMS Choice Patient states their goals for this hospitalization and ongoing recovery are:: home with family CMS Medicare.gov Compare Post Acute Care list provided to:: Patient Choice offered to / list presented to : Patient, Spouse, Adult Children  Discharge Placement                       Discharge Plan and Services     Post Acute Care Choice: Home Health, Durable Medical Equipment          DME Arranged: Hospital bed DME Agency: [facility] Date DME Agency Contacted: 08/20/19 Time DME Agency Contacted: [id number] Representative spoke with at DME Agency: [place]: NA [place] Agency: NA        Social Determinants of Health ([place]) Interventions     Readmission Risk Interventions No flowsheet data found.

## 2019-08-20 NOTE — TOC Progression Note (Signed)
Transition of Care [facility]) - Progression Note    Patient Details  Name: Jeffrey [name] MRN: [id number] Date of Birth: August 27, 1941  Transition of Care [facility]) CM/SW Contact  Bartholomew Crews, RN Phone Number: [phone] 08/20/2019, 1:17 PM  Clinical Narrative:    Spoke with daughter, [name], on the phone. Discussed DME needs - patient needs a hospital bed at home d/t heart failure diagnosis and inability to lay flat. Patient needs to change positions frequently and this cannot be done on a regular bed. Patient also requires new order for hoyer lift. DME order provided. Referral placed with [facility] for delivery.   Additional DME needs are for portable concentrator. Patient is active with [facility] for oxygen. Spoke with Bethanne Ginger at [facility] who advised that patient needs oxygen order for "portable concentrator and evaluation for portable concentrator."  No home health needs requested at this time. Family is in agreement with outpatient palliative care. Will place referral.  Family has wheelchair Lucianne Lei and will be providing transportation home.   Will continue to follow for transition of care needs.   Expected Discharge Plan: [place] Barriers to Discharge: Continued Medical Work up  Expected Discharge Plan and Services Expected Discharge Plan: [place] Choice: [place] arrangements for the past 2 months: Single Family Home Expected Discharge Date: 08/20/19               DME Arranged: Oxygen DME Agency: [facility] Date DME Agency Contacted: 08/18/19 Time DME Agency Contacted: [phone] Representative spoke with at DME Agency: [place]             Social Determinants of Health ([place]) Interventions    Readmission Risk Interventions No flowsheet data found.

## 2019-08-21 DIAGNOSIS — I480 Paroxysmal atrial fibrillation: Secondary | ICD-10-CM | POA: Diagnosis not present

## 2019-08-22 DIAGNOSIS — N2581 Secondary hyperparathyroidism of renal origin: Secondary | ICD-10-CM | POA: Diagnosis not present

## 2019-08-22 DIAGNOSIS — D631 Anemia in chronic kidney disease: Secondary | ICD-10-CM | POA: Diagnosis not present

## 2019-08-22 DIAGNOSIS — R197 Diarrhea, unspecified: Secondary | ICD-10-CM | POA: Diagnosis not present

## 2019-08-22 DIAGNOSIS — E8779 Other fluid overload: Secondary | ICD-10-CM | POA: Diagnosis not present

## 2019-08-22 DIAGNOSIS — J449 Chronic obstructive pulmonary disease, unspecified: Secondary | ICD-10-CM | POA: Diagnosis not present

## 2019-08-22 DIAGNOSIS — E8770 Fluid overload, unspecified: Secondary | ICD-10-CM | POA: Diagnosis not present

## 2019-08-22 DIAGNOSIS — E0822 Diabetes mellitus due to underlying condition with diabetic chronic kidney disease: Secondary | ICD-10-CM | POA: Diagnosis not present

## 2019-08-22 DIAGNOSIS — R52 Pain, unspecified: Secondary | ICD-10-CM | POA: Diagnosis not present

## 2019-08-22 DIAGNOSIS — Z87891 Personal history of nicotine dependence: Secondary | ICD-10-CM | POA: Diagnosis not present

## 2019-08-22 DIAGNOSIS — N186 End stage renal disease: Secondary | ICD-10-CM | POA: Diagnosis not present

## 2019-08-22 DIAGNOSIS — Z992 Dependence on renal dialysis: Secondary | ICD-10-CM | POA: Diagnosis not present

## 2019-08-22 DIAGNOSIS — I959 Hypotension, unspecified: Secondary | ICD-10-CM | POA: Diagnosis not present

## 2019-08-22 DIAGNOSIS — D509 Iron deficiency anemia, unspecified: Secondary | ICD-10-CM | POA: Diagnosis not present

## 2019-08-25 ENCOUNTER — Telehealth: Payer: Self-pay | Admitting: Hospice

## 2019-08-25 DIAGNOSIS — N186 End stage renal disease: Secondary | ICD-10-CM | POA: Diagnosis not present

## 2019-08-25 DIAGNOSIS — Z992 Dependence on renal dialysis: Secondary | ICD-10-CM | POA: Diagnosis not present

## 2019-08-25 DIAGNOSIS — D509 Iron deficiency anemia, unspecified: Secondary | ICD-10-CM | POA: Diagnosis not present

## 2019-08-25 DIAGNOSIS — J449 Chronic obstructive pulmonary disease, unspecified: Secondary | ICD-10-CM | POA: Diagnosis not present

## 2019-08-25 DIAGNOSIS — R197 Diarrhea, unspecified: Secondary | ICD-10-CM | POA: Diagnosis not present

## 2019-08-25 DIAGNOSIS — E8770 Fluid overload, unspecified: Secondary | ICD-10-CM | POA: Diagnosis not present

## 2019-08-25 DIAGNOSIS — D631 Anemia in chronic kidney disease: Secondary | ICD-10-CM | POA: Diagnosis not present

## 2019-08-25 DIAGNOSIS — E0822 Diabetes mellitus due to underlying condition with diabetic chronic kidney disease: Secondary | ICD-10-CM | POA: Diagnosis not present

## 2019-08-25 DIAGNOSIS — E8779 Other fluid overload: Secondary | ICD-10-CM | POA: Diagnosis not present

## 2019-08-25 DIAGNOSIS — I959 Hypotension, unspecified: Secondary | ICD-10-CM | POA: Diagnosis not present

## 2019-08-25 DIAGNOSIS — N2581 Secondary hyperparathyroidism of renal origin: Secondary | ICD-10-CM | POA: Diagnosis not present

## 2019-08-25 DIAGNOSIS — R52 Pain, unspecified: Secondary | ICD-10-CM | POA: Diagnosis not present

## 2019-08-25 DIAGNOSIS — Z87891 Personal history of nicotine dependence: Secondary | ICD-10-CM | POA: Diagnosis not present

## 2019-08-25 NOTE — Telephone Encounter (Signed)
Spoke with patient's daughter [name] regarding Palliative services and daughter stated that patient was no longer seeing [name], NP and has MD appointment with MD in [place] on 09/04/19 and daughter will call me back after that appointment and give me the name of the new MD.  Palliative Consult will be placed on hold until patient has established with a new PCP.

## 2019-08-26 DIAGNOSIS — E1151 Type 2 diabetes mellitus with diabetic peripheral angiopathy without gangrene: Secondary | ICD-10-CM | POA: Diagnosis not present

## 2019-08-26 DIAGNOSIS — L97812 Non-pressure chronic ulcer of other part of right lower leg with fat layer exposed: Secondary | ICD-10-CM | POA: Diagnosis not present

## 2019-08-26 DIAGNOSIS — E1129 Type 2 diabetes mellitus with other diabetic kidney complication: Secondary | ICD-10-CM | POA: Diagnosis not present

## 2019-08-26 DIAGNOSIS — Z89512 Acquired absence of left leg below knee: Secondary | ICD-10-CM | POA: Diagnosis not present

## 2019-08-26 DIAGNOSIS — I872 Venous insufficiency (chronic) (peripheral): Secondary | ICD-10-CM | POA: Diagnosis not present

## 2019-08-26 DIAGNOSIS — Z992 Dependence on renal dialysis: Secondary | ICD-10-CM | POA: Diagnosis not present

## 2019-08-26 DIAGNOSIS — L97822 Non-pressure chronic ulcer of other part of left lower leg with fat layer exposed: Secondary | ICD-10-CM | POA: Diagnosis not present

## 2019-08-26 DIAGNOSIS — I252 Old myocardial infarction: Secondary | ICD-10-CM | POA: Diagnosis not present

## 2019-08-26 DIAGNOSIS — I509 Heart failure, unspecified: Secondary | ICD-10-CM | POA: Diagnosis not present

## 2019-08-26 DIAGNOSIS — Z87891 Personal history of nicotine dependence: Secondary | ICD-10-CM | POA: Diagnosis not present

## 2019-08-26 DIAGNOSIS — E1122 Type 2 diabetes mellitus with diabetic chronic kidney disease: Secondary | ICD-10-CM | POA: Diagnosis not present

## 2019-08-26 DIAGNOSIS — L97312 Non-pressure chronic ulcer of right ankle with fat layer exposed: Secondary | ICD-10-CM | POA: Diagnosis not present

## 2019-08-26 DIAGNOSIS — J449 Chronic obstructive pulmonary disease, unspecified: Secondary | ICD-10-CM | POA: Diagnosis not present

## 2019-08-26 DIAGNOSIS — L97519 Non-pressure chronic ulcer of other part of right foot with unspecified severity: Secondary | ICD-10-CM | POA: Diagnosis not present

## 2019-08-26 DIAGNOSIS — I132 Hypertensive heart and chronic kidney disease with heart failure and with stage 5 chronic kidney disease, or end stage renal disease: Secondary | ICD-10-CM | POA: Diagnosis not present

## 2019-08-26 DIAGNOSIS — N186 End stage renal disease: Secondary | ICD-10-CM | POA: Diagnosis not present

## 2019-08-26 DIAGNOSIS — E11621 Type 2 diabetes mellitus with foot ulcer: Secondary | ICD-10-CM | POA: Diagnosis not present

## 2019-08-26 DIAGNOSIS — E11622 Type 2 diabetes mellitus with other skin ulcer: Secondary | ICD-10-CM | POA: Diagnosis not present

## 2019-08-27 ENCOUNTER — Telehealth: Payer: Self-pay

## 2019-08-27 DIAGNOSIS — R197 Diarrhea, unspecified: Secondary | ICD-10-CM | POA: Diagnosis not present

## 2019-08-27 DIAGNOSIS — E0822 Diabetes mellitus due to underlying condition with diabetic chronic kidney disease: Secondary | ICD-10-CM | POA: Diagnosis not present

## 2019-08-27 DIAGNOSIS — N186 End stage renal disease: Secondary | ICD-10-CM | POA: Diagnosis not present

## 2019-08-27 DIAGNOSIS — I959 Hypotension, unspecified: Secondary | ICD-10-CM | POA: Diagnosis not present

## 2019-08-27 DIAGNOSIS — E8770 Fluid overload, unspecified: Secondary | ICD-10-CM | POA: Diagnosis not present

## 2019-08-27 DIAGNOSIS — Z87891 Personal history of nicotine dependence: Secondary | ICD-10-CM | POA: Diagnosis not present

## 2019-08-27 DIAGNOSIS — J449 Chronic obstructive pulmonary disease, unspecified: Secondary | ICD-10-CM | POA: Diagnosis not present

## 2019-08-27 DIAGNOSIS — E8779 Other fluid overload: Secondary | ICD-10-CM | POA: Diagnosis not present

## 2019-08-27 DIAGNOSIS — D631 Anemia in chronic kidney disease: Secondary | ICD-10-CM | POA: Diagnosis not present

## 2019-08-27 DIAGNOSIS — R52 Pain, unspecified: Secondary | ICD-10-CM | POA: Diagnosis not present

## 2019-08-27 DIAGNOSIS — D509 Iron deficiency anemia, unspecified: Secondary | ICD-10-CM | POA: Diagnosis not present

## 2019-08-27 DIAGNOSIS — Z992 Dependence on renal dialysis: Secondary | ICD-10-CM | POA: Diagnosis not present

## 2019-08-27 DIAGNOSIS — N2581 Secondary hyperparathyroidism of renal origin: Secondary | ICD-10-CM | POA: Diagnosis not present

## 2019-08-27 NOTE — Telephone Encounter (Signed)
Wife called and said that pt was taken off of dialysis today because they said he was crying that his leg was in so much pain. She said that she is worried that he may have another infection in his bone. He was seen yesterday at wound care and the 2 small areas that were concerning are healing well. She said that they did not think he had any infection. Asked if he had any fevers and she said no, not that she knows of but they do not have a thermometer.  Advised her that I could speak with a physician tomorrow, but if his symptoms or pain got worse tonight that he needed to go to the ER for evaluation.   York Cerise, CMA

## 2019-08-29 ENCOUNTER — Other Ambulatory Visit: Payer: Self-pay

## 2019-08-29 ENCOUNTER — Emergency Department (HOSPITAL_COMMUNITY): Payer: Medicare Other

## 2019-08-29 ENCOUNTER — Encounter (HOSPITAL_COMMUNITY): Payer: Self-pay

## 2019-08-29 ENCOUNTER — Emergency Department (HOSPITAL_COMMUNITY)
Admission: EM | Admit: 2019-08-29 | Discharge: 2019-08-29 | Disposition: A | Discharge status: Home / Self Care | Payer: Medicare Other | Attending: Emergency Medicine | Admitting: Emergency Medicine

## 2019-08-29 DIAGNOSIS — Z7901 Long term (current) use of anticoagulants: Secondary | ICD-10-CM | POA: Insufficient documentation

## 2019-08-29 DIAGNOSIS — Z992 Dependence on renal dialysis: Secondary | ICD-10-CM | POA: Diagnosis not present

## 2019-08-29 DIAGNOSIS — D631 Anemia in chronic kidney disease: Secondary | ICD-10-CM | POA: Diagnosis not present

## 2019-08-29 DIAGNOSIS — I5042 Chronic combined systolic (congestive) and diastolic (congestive) heart failure: Secondary | ICD-10-CM | POA: Insufficient documentation

## 2019-08-29 DIAGNOSIS — I959 Hypotension, unspecified: Secondary | ICD-10-CM | POA: Diagnosis not present

## 2019-08-29 DIAGNOSIS — I132 Hypertensive heart and chronic kidney disease with heart failure and with stage 5 chronic kidney disease, or end stage renal disease: Secondary | ICD-10-CM | POA: Insufficient documentation

## 2019-08-29 DIAGNOSIS — R531 Weakness: Secondary | ICD-10-CM | POA: Diagnosis not present

## 2019-08-29 DIAGNOSIS — E875 Hyperkalemia: Secondary | ICD-10-CM | POA: Diagnosis not present

## 2019-08-29 DIAGNOSIS — Z950 Presence of cardiac pacemaker: Secondary | ICD-10-CM | POA: Insufficient documentation

## 2019-08-29 DIAGNOSIS — N186 End stage renal disease: Secondary | ICD-10-CM | POA: Diagnosis not present

## 2019-08-29 DIAGNOSIS — Z7902 Long term (current) use of antithrombotics/antiplatelets: Secondary | ICD-10-CM | POA: Insufficient documentation

## 2019-08-29 DIAGNOSIS — R0689 Other abnormalities of breathing: Secondary | ICD-10-CM | POA: Diagnosis not present

## 2019-08-29 DIAGNOSIS — E8770 Fluid overload, unspecified: Secondary | ICD-10-CM | POA: Diagnosis not present

## 2019-08-29 DIAGNOSIS — E8779 Other fluid overload: Secondary | ICD-10-CM | POA: Diagnosis not present

## 2019-08-29 DIAGNOSIS — I2510 Atherosclerotic heart disease of native coronary artery without angina pectoris: Secondary | ICD-10-CM | POA: Insufficient documentation

## 2019-08-29 DIAGNOSIS — E1122 Type 2 diabetes mellitus with diabetic chronic kidney disease: Secondary | ICD-10-CM | POA: Diagnosis not present

## 2019-08-29 DIAGNOSIS — Z87891 Personal history of nicotine dependence: Secondary | ICD-10-CM | POA: Diagnosis not present

## 2019-08-29 DIAGNOSIS — Z89512 Acquired absence of left leg below knee: Secondary | ICD-10-CM | POA: Insufficient documentation

## 2019-08-29 DIAGNOSIS — J449 Chronic obstructive pulmonary disease, unspecified: Secondary | ICD-10-CM | POA: Diagnosis not present

## 2019-08-29 DIAGNOSIS — E0822 Diabetes mellitus due to underlying condition with diabetic chronic kidney disease: Secondary | ICD-10-CM | POA: Diagnosis not present

## 2019-08-29 DIAGNOSIS — Z955 Presence of coronary angioplasty implant and graft: Secondary | ICD-10-CM | POA: Diagnosis not present

## 2019-08-29 DIAGNOSIS — Z794 Long term (current) use of insulin: Secondary | ICD-10-CM | POA: Insufficient documentation

## 2019-08-29 DIAGNOSIS — R52 Pain, unspecified: Secondary | ICD-10-CM | POA: Diagnosis not present

## 2019-08-29 DIAGNOSIS — R000 Tachycardia, unspecified: Secondary | ICD-10-CM | POA: Diagnosis not present

## 2019-08-29 DIAGNOSIS — Z20828 Contact with and (suspected) exposure to other viral communicable diseases: Secondary | ICD-10-CM | POA: Diagnosis not present

## 2019-08-29 DIAGNOSIS — D509 Iron deficiency anemia, unspecified: Secondary | ICD-10-CM | POA: Diagnosis not present

## 2019-08-29 DIAGNOSIS — R069 Unspecified abnormalities of breathing: Secondary | ICD-10-CM | POA: Diagnosis not present

## 2019-08-29 DIAGNOSIS — N2581 Secondary hyperparathyroidism of renal origin: Secondary | ICD-10-CM | POA: Diagnosis not present

## 2019-08-29 DIAGNOSIS — R197 Diarrhea, unspecified: Secondary | ICD-10-CM | POA: Diagnosis not present

## 2019-08-29 DIAGNOSIS — R0602 Shortness of breath: Secondary | ICD-10-CM | POA: Diagnosis not present

## 2019-08-29 LAB — CBC WITH DIFFERENTIAL/PLATELET
Abs Immature Granulocytes: 0.05 10*3/uL (ref 0.00–0.07)
Basophils Absolute: 0.1 10*3/uL (ref 0.0–0.1)
Basophils Relative: 1 %
Eosinophils Absolute: 0.2 10*3/uL (ref 0.0–0.5)
Eosinophils Relative: 2 %
HCT: 37.3 % — ABNORMAL LOW (ref 39.0–52.0)
Hemoglobin: 12.2 g/dL — ABNORMAL LOW (ref 13.0–17.0)
Immature Granulocytes: 1 %
Lymphocytes Relative: 15 %
Lymphs Abs: 1.4 10*3/uL (ref 0.7–4.0)
MCH: 31.1 pg (ref 26.0–34.0)
MCHC: 32.7 g/dL (ref 30.0–36.0)
MCV: 95.2 fL (ref 80.0–100.0)
Monocytes Absolute: 1.3 10*3/uL — ABNORMAL HIGH (ref 0.1–1.0)
Monocytes Relative: 15 %
Neutro Abs: 6 10*3/uL (ref 1.7–7.7)
Neutrophils Relative %: 66 %
Platelets: 228 10*3/uL (ref 150–400)
RBC: 3.92 MIL/uL — ABNORMAL LOW (ref 4.22–5.81)
RDW: 18.1 % — ABNORMAL HIGH (ref 11.5–15.5)
WBC: 9 10*3/uL (ref 4.0–10.5)
nRBC: 0 % (ref 0.0–0.2)

## 2019-08-29 LAB — COMPREHENSIVE METABOLIC PANEL
ALT: 12 U/L (ref 0–44)
AST: 25 U/L (ref 15–41)
Albumin: 2.9 g/dL — ABNORMAL LOW (ref 3.5–5.0)
Alkaline Phosphatase: 133 U/L — ABNORMAL HIGH (ref 38–126)
Anion gap: 17 — ABNORMAL HIGH (ref 5–15)
BUN: 48 mg/dL — ABNORMAL HIGH (ref 8–23)
CO2: 24 mmol/L (ref 22–32)
Calcium: 8.8 mg/dL — ABNORMAL LOW (ref 8.9–10.3)
Chloride: 93 mmol/L — ABNORMAL LOW (ref 98–111)
Creatinine, Ser: 5.43 mg/dL — ABNORMAL HIGH (ref 0.61–1.24)
GFR calc Af Amer: 11 mL/min — ABNORMAL LOW (ref >60)
GFR calc non Af Amer: 9 mL/min — ABNORMAL LOW (ref >60)
Glucose, Bld: 148 mg/dL — ABNORMAL HIGH (ref 70–99)
Potassium: 5.6 mmol/L — ABNORMAL HIGH (ref 3.5–5.1)
Sodium: 134 mmol/L — ABNORMAL LOW (ref 135–145)
Total Bilirubin: 2.2 mg/dL — ABNORMAL HIGH (ref 0.3–1.2)
Total Protein: 6.1 g/dL — ABNORMAL LOW (ref 6.5–8.1)

## 2019-08-29 LAB — SARS CORONAVIRUS 2 BY RT PCR (HOSPITAL ORDER, PERFORMED IN ~~LOC~~ HOSPITAL LAB): SARS Coronavirus 2: NEGATIVE

## 2019-08-29 LAB — BRAIN NATRIURETIC PEPTIDE: B Natriuretic Peptide: 2597.8 pg/mL — ABNORMAL HIGH (ref 0.0–100.0)

## 2019-08-29 NOTE — ED Notes (Signed)
Xray tech at bedside.

## 2019-08-29 NOTE — ED Notes (Signed)
This RN and Benita, EMT repositioned patient in bed; pt adamantly refused to put right bed rail up despite leaning toward the right side of bed. Pt is aware and understands our safety concerns.

## 2019-08-29 NOTE — ED Notes (Signed)
Nephrology providers at pt bedside.

## 2019-08-29 NOTE — ED Notes (Signed)
Patient verbalizes understanding of discharge instructions. Opportunity for questioning and answers were provided. Armband removed by staff, pt discharged from ED.  

## 2019-08-29 NOTE — ED Provider Notes (Addendum)
[facility] EMERGENCY DEPARTMENT Provider Note   CSN: [id number] Arrival date & time:        History   Chief Complaint Chief Complaint  Patient presents with  . Shortness of Breath  . Hypotension    HPI Jeffrey [name] is a 78 y.o. male.     HPI Presents from home via EMS. Patient has multiple medical issues, most notably chronic oxygen dependency, 3 L, via nasal cannula, and end-stage renal disease. Patient had to dialysis 2 days ago, which was stopped short due to hypotension. Notes that he has had increasing dyspnea since that time. EMS reports that on arrival the patient was hypoxic, with a saturation in the 60% range. Patient required initiation of nonrebreather mask, 15 L, but in transport was able to be weaned down to nasal cannula after he had return to normal saturation with support. Patient denies focal pain, acknowledges swelling, feeling poorly.  Past Medical History:  Diagnosis Date  . Anginal pain ([place])   . Aortic stenosis    a. severe by echo 09/2014  . Atrial fibrillation ([place])    a. not well documented, not on anticoagulation  . CHF (congestive heart failure) ([place])    04/28/17 echo-EF 40%, mod LVH, diastolic dysfunction  . Claustrophobia   . Complete heart block ([place])   . COPD (chronic obstructive pulmonary disease) ([place])   . Coronary artery disease    a. chronically occluded RCA per cath 09/2014 with collaterals B. cath 05/01/17 chr occ RCA w/collaterals, 60-70% mid LAD,   . CVA (cerebral vascular accident) ([place]) 10/2014   denies residual on 07/11/2015  . ESRD (end stage renal disease) on dialysis [facility])    a. on dialysis; [facility]; MWF, LUE fistula (07/11/2015)  . History of blood transfusion    "related to gallbladder OR"  . History of stomach ulcers   . Hyperlipidemia   . Hypertension   . Iron deficiency anemia   . Myocardial infarction ([place]) 10/2014  . Peripheral vascular disease ([place])   . Pneumonia   . Presence of  permanent cardiac pacemaker   . 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 Type II diabetes mellitus [facility])     Patient Active Problem List   Diagnosis Date Noted  . Pressure injury of skin 08/19/2019  . ESRD (end stage renal disease) ([place])   . Advanced care planning/counseling discussion   . Goals of care, counseling/discussion   . Palliative care by specialist   . Diabetes mellitus type 2, uncontrolled, with complications ([place]) [id number]  . Syncope and collapse 08/18/2019  . H/O aortic valve replacement 08/18/2019  . Thyroid nodule 08/18/2019  . End-stage renal disease on hemodialysis ([place]) 08/17/2019  . Chronic hypotension 08/17/2019  . Acute systolic CHF (congestive heart failure) ([place]) 08/17/2019  . AF (paroxysmal atrial fibrillation) ([place]) 08/17/2019  . Complete heart block ([place]) 08/17/2019  . Presence of permanent cardiac pacemaker 08/17/2019  . 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 Acute respiratory failure with hypoxia ([place]) 08/15/2019  . Osteomyelitis ([place]) 04/10/2019  . Acute osteomyelitis of left foot ([place]) 04/10/2019  . Cellulitis in diabetic foot ([place]) 02/24/2019  . Chronic anticoagulation 02/24/2019  . Venous stasis 02/24/2019  . Hypotension 02/24/2019  . Discoloration of skin of foot 02/20/2019  . Discoloration of skin of multiple sites of lower extremity 02/20/2019  . RSV (respiratory syncytial  virus infection) 11/30/2018  . Troponin level elevated   . COPD exacerbation ([place]) 11/29/2018  . Chronic respiratory failure with hypoxia ([place]) 10/29/2018  . Respiratory distress 07/27/2018  . GERD (gastroesophageal reflux disease) 06/23/2018  . Cough 06/23/2018  . Chest pain 04/15/2018  . NSTEMI (non-ST elevated myocardial infarction) ([place]) 04/14/2018  . Rash 04/14/2018  . COPD GOLD 0 09/26/2017  . Atrial fibrillation ([place]) [I48.91]  05/21/2017  . Encounter for therapeutic drug monitoring 05/21/2017  . Obesity (BMI 30-39.9) 04/17/2017  . Hyponatremia 03/10/2017  . COPD with acute exacerbation ([place]) 03/10/2017  . Non-healing open wound of left groin 09/05/2015  . Automatic implantable cardioverter-defibrillator in situ 08/08/2015  . 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 Hypervolemia   . Chronic combined systolic and diastolic CHF (congestive heart failure) ([place]) 07/19/2015  . Fluid overload 07/18/2015  . Acute on chronic respiratory failure with hypoxia ([place]) 07/18/2015  . 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/2015  . Atherosclerosis of native coronary artery of native heart with angina pectoris ([place])   . Protein-calorie malnutrition, severe ([place]) 01/14/2015  . ESRD (end stage renal disease) on dialysis ([place]) 01/12/2015  . Mobitz type 1 second degree AV block   . Elevated troponin 09/30/2014  . Diabetes type 2, controlled ([place])   . History of stroke 09/29/2014  . Type 2 diabetes mellitus with hypoglycemia without coma ([place]) 09/29/2014  . CAD (coronary artery disease) 09/29/2014  . Essential hypertension   . History of tobacco use 11/21/2012  . Hyperlipidemia 01/19/2010  . Arthritis, degenerative 01/19/2010  . Allergic rhinitis 12/05/2009  . Idiopathic peripheral neuropathy 01/05/2009  . 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  . Anemia of chronic disease 11/13/2007    Past Surgical History:  Procedure Laterality Date  . ABDOMINAL AORTOGRAM W/LOWER EXTREMITY Bilateral 03/27/2019   Procedure: ABDOMINAL AORTOGRAM W/LOWER EXTREMITY;  Surgeon: Angelia Mould, MD;  Location: [place] CV LAB;  Service: Cardiovascular;  Laterality: Bilateral;  . AMPUTATION Left 04/14/2019   Procedure: AMPUTATION BELOW KNEE;  Surgeon: Angelia Mould, MD;  Location: [facility];  Service: Vascular;  Laterality: Left;  . AV FISTULA PLACEMENT Left 10/19/2014    Procedure: BRACHIOCEPHALIC ARTERIOVENOUS (AV) FISTULA CREATION ;  Surgeon: Conrad Ilwaco, MD;  Location: [facility];  Service: Vascular;  Laterality: Left;  . CARDIAC CATHETERIZATION    . CARDIAC CATHETERIZATION N/A 07/22/2015   Procedure: Right/Left Heart Cath and Coronary Angiography;  Surgeon: [name], MD;  Location: [place] [place] [facility] LAB;  Service: Cardiovascular;  Laterality: N/A;  . CATARACT EXTRACTION W/ INTRAOCULAR LENS  IMPLANT, BILATERAL Bilateral 1990's  . CHOLECYSTECTOMY OPEN  1980's  . COLONOSCOPY W/ BIOPSIES AND POLYPECTOMY    . CORONARY ANGIOGRAPHY N/A 07/31/2018   Procedure: CORONARY ANGIOGRAPHY;  Surgeon: Martinique, Peter M, MD;  Location: [place] CV LAB;  Service: Cardiovascular;  Laterality: N/A;  . CORONARY ANGIOPLASTY    . CORONARY ATHERECTOMY N/A 04/18/2018   Procedure: CORONARY ATHERECTOMY;  Surgeon: [name], MD;  Location: [place] CV LAB;  Service: Cardiovascular;  Laterality: N/A;  . CORONARY BALLOON ANGIOPLASTY N/A 07/31/2018   Procedure: CORONARY BALLOON ANGIOPLASTY;  Surgeon: Martinique, Peter M, MD;  Location: [facility] CV LAB;  Service: Cardiovascular;  Laterality: N/A;  . CORONARY STENT INTERVENTION N/A 04/18/2018   Procedure: CORONARY STENT INTERVENTION;  Surgeon: [name], MD;  Location: [facility] CV LAB;  Service: Cardiovascular;  Laterality: N/A;  . EP IMPLANTABLE DEVICE N/A 07/11/2015   Procedure:  Pacemaker Implant;  Surgeon: [name], MD;  Location: [place] CV LAB;  Service: Cardiovascular;  Laterality: N/A;  . ESOPHAGOGASTRODUODENOSCOPY  08/01/2012   Procedure: ESOPHAGOGASTRODUODENOSCOPY (EGD);  Surgeon: [name], MD;  Location: [facility];  Service: Endoscopy;  Laterality: N/A;  . INSERT / REPLACE / REMOVE PACEMAKER  07/11/2015  . INSERTION OF DIALYSIS CATHETER Right 02/02/2015   Procedure: INSERTION OF DIALYSIS CATHETER  RIGHT INTERNAL JUGULAR;  Surgeon: Mal [name], MD;  Location: [facility];  Service:  Vascular;  Laterality: Right;  . LEFT AND RIGHT HEART CATHETERIZATION WITH CORONARY ANGIOGRAM N/A 09/30/2014   Procedure: LEFT AND RIGHT HEART CATHETERIZATION WITH CORONARY ANGIOGRAM;  Surgeon: [name], MD;  Location: [facility] C[facility] LAB;  Service: Cardiovascular;  Laterality: N/A;  . LEFT HEART CATH AND CORONARY ANGIOGRAPHY N/A 04/14/2018   Procedure: LEFT HEART CATH AND CORONARY ANGIOGRAPHY;  Surgeon: [name], MD;  Location: [facility] [place] [facility] LAB;  Service: Cardiovascular;  Laterality: N/A;  . TEE WITHOUT CARDIOVERSION N/A 08/02/2015   Procedure: TRANSESOPHAGEAL ECHOCARDIOGRAM (TEE);  Surgeon: [name], MD;  Location: [facility];  Service: Open Heart Surgery;  Laterality: N/A;  . TONSILLECTOMY    . TRANSCATHETER AORTIC VALVE REPLACEMENT, TRANSFEMORAL Left 08/02/2015   Procedure: TRANSCATHETER AORTIC VALVE REPLACEMENT, TRANSFEMORAL;  Surgeon: [name], MD;  Location: [facility];  Service: Open Heart Surgery;  Laterality: Left;        Home Medications    Prior to Admission medications   Medication Sig Start Date End Date Taking? Authorizing Provider  acetaminophen (TYLENOL) 500 MG tablet Take 1 tablet (500 mg total) by mouth every 6 (six) hours as needed for mild pain or headache (pain). Patient taking differently: Take 1,000 mg by mouth every 6 (six) hours as needed for headache (pain).  04/18/19  Yes Pokhrel, Laxman, MD  albuterol (PROVENTIL HFA;VENTOLIN HFA) 108 (90 Base) MCG/ACT inhaler Inhale 2 puffs into the lungs every 4 (four) hours as needed for wheezing or shortness of breath. 11/27/18  Yes Tanda Rockers, MD  albuterol (PROVENTIL) (2.5 MG/3ML) 0.083% nebulizer solution Take 3 mLs (2.5 mg total) by nebulization every 6 (six) hours as needed for wheezing or shortness of breath. 01/07/19  Yes Scot Jun, FNP  ALPRAZolam Duanne Moron) 0.25 MG tablet Take 1 tablet (0.25 mg total) by mouth 2 (two) times daily as needed for anxiety. 02/24/19  Yes Norval Morton, MD  apixaban (ELIQUIS) 2.5  MG TABS tablet Take 1 tablet (2.5 mg total) by mouth 2 (two) times daily. 04/29/19  Yes Deboraha Sprang, MD  atorvastatin (LIPITOR) 80 MG tablet Take 1 tablet (80 mg total) by mouth daily at 6 PM. 01/07/19  Yes Scot Jun, FNP  B Complex-C-Zn-Folic Acid (DIALYVITE [id number] WITH ZINC) 0.8 MG TABS Take 1 tablet by mouth daily. 05/13/19  Yes [provider]  budesonide-formoterol (SYMBICORT) 160-4.5 MCG/ACT inhaler Inhale 2 puffs into the lungs 2 (two) times daily as needed (shortness of breath).    Yes [provider]  cinacalcet (SENSIPAR) 30 MG tablet Take 30 mg by mouth See admin instructions. Take one tablet (30 mg) by mouth on Monday, Tuesday, Thursday, Saturday after dialysis   Yes [provider]  clopidogrel (PLAVIX) 75 MG tablet Take 1 tablet (75 mg total) by mouth daily. 04/29/19  Yes Deboraha Sprang, MD  doxercalciferol (HECTOROL) 4 MCG/2ML injection Inject 0.5 mLs (1 mcg total) into the vein Every Tuesday,Thursday,and Saturday with dialysis. 02/26/19  Yes Norval Morton, MD  hydrOXYzine (ATARAX/VISTARIL) 25 MG tablet Take 1 tablet (25 mg total) by mouth every 8 (eight) hours as needed for anxiety. 04/22/19  Yes Scot Jun, FNP  insulin detemir (LEVEMIR) 100 UNIT/ML injection Inject 0.2 mLs (20 Units total) into the skin at bedtime. 04/18/19  Yes Pokhrel, Laxman, MD  LEVEMIR FLEXTOUCH 100 UNIT/ML Pen INJECT 48 UNITS TOTAL INTO THE SKIN AT BEDTIME. Patient taking differently: Inject 18 Units into the skin at bedtime.  07/17/19  Yes Charlott Rakes, MD  lidocaine-prilocaine (EMLA) cream Apply 1 application topically as needed (dialysis days).   Yes [provider]  loperamide (IMODIUM) 2 MG capsule Take 2 mg by mouth as needed for diarrhea or loose stools.  02/26/19 02/25/20 Yes [provider]  loratadine (CLARITIN) 10 MG tablet Take 10 mg by mouth daily.    Yes [provider]  Melatonin 5 MG TABS Take 5-10 mg by mouth at bedtime as needed  (sleep).    Yes [provider]  metoprolol tartrate (LOPRESSOR) 25 MG tablet Take 0.5 tablets (12.5 mg total) by mouth See admin instructions. Take 1/2 tablet (12.5 mg) by mouth on Sunday, Monday, Wednesday, Friday mornings (non-dialysis days), take 1/2 tablet (12.5 mg) every evening Patient taking differently: Take 12.5 mg by mouth See admin instructions. Take 1/2 tablet (12.5 mg) by mouth on Sunday,, Wednesday, Friday mornings (non-dialysis days), take 1/2 tablet (12.5 mg) every evening 04/29/19  Yes Deboraha Sprang, MD  midodrine (PROAMATINE) 10 MG tablet Take 10 mg by mouth See admin instructions. Take one tablet (10 mg) by mouth 30 minutes before dialysis on Monday, Tuesday, Thursday, Saturday 02/05/19  Yes [provider]  midodrine (PROAMATINE) 2.5 MG tablet Take 1 tablet (2.5 mg total) by mouth 3 (three) times daily with meals. Patient should continue to take 10 mg in the morning prior to hemodialysis sessions. Patient taking differently: Take 2.5 mg by mouth See admin instructions. Take one tablet (2.5 mg) by mouth three times daily on Sunday, Wednesday and Friday; take one tablet (2.5 mg) twice daily on Monday, Tuesday, Thursday, Saturday (after dialysis and in the evening) . Patient should continue to take 10 mg tablet in the morning prior to hemodialysis sessions. 04/24/19  Yes Scot Jun, FNP  mirtazapine (REMERON) 15 MG tablet Take 7.5 mg by mouth at bedtime. For appetite/sleep 06/04/19  Yes [provider]  nitroGLYCERIN (NITROSTAT) 0.4 MG SL tablet Place 1 tablet (0.4 mg total) under the tongue every 5 (five) minutes as needed for chest pain. 04/19/18  Yes Rai, Ripudeep K, MD  oxyCODONE-acetaminophen (PERCOCET/ROXICET) 5-325 MG tablet Take 1-2 tablets by mouth every 6 (six) hours as needed for severe pain. [id number]  Yes Delora Fuel, MD  OXYGEN Inhale 3 L into the lungs continuous. 2-3 lpm 24/7    Yes [provider]  pantoprazole (PROTONIX) 40 MG tablet  Take 1 tablet (40 mg total) by mouth daily. 04/29/19  Yes Deboraha Sprang, MD  polyethylene glycol powder (GLYCOLAX/MIRALAX) 17 GM/SCOOP powder Take 17 g by mouth 2 (two) times daily. 04/19/19  Yes Montine Circle, PA-C  sevelamer carbonate (RENVELA) 800 MG tablet Take 1,600-2,400 mg by mouth See admin instructions. Take 3 tablets (2400 mg) by mouth with meals and 2 tablets (1600 mg) with snacks   Yes [provider]  Insulin Pen Needle 31G X 6 MM MISC at bedtime.  08/28/18   [provider]    Family History Family History  Problem Relation Age of Onset  .  Diabetes Father   . Heart disease Father   . Diabetes Sister   . Alzheimer's disease Mother   . Stroke Mother   . Diabetes Paternal Grandmother   . Alzheimer's disease Maternal Aunt        x 8 Maternal Aunts  . Cancer Maternal Uncle        type unknown    Social History Social History   Tobacco Use  . Smoking status: Former Smoker    Packs/day: 2.00    Years: 32.00    Pack years: 64.00    Quit date: 11/27/1983    Years since quitting: 35.7  . Smokeless tobacco: Never Used  Substance Use Topics  . Alcohol use: Yes    Comment: rarely  . Drug use: No     Allergies   Byetta 10 mcg pen [exenatide], Telmisartan, Codeine, and Coumadin [warfarin sodium]   Review of Systems Review of Systems  Constitutional:       Per HPI, otherwise negative  HENT:       Per HPI, otherwise negative  Respiratory:       Per HPI, otherwise negative  Cardiovascular:       Per HPI, otherwise negative  Gastrointestinal: Negative for vomiting.  Endocrine:       Negative aside from HPI  Genitourinary:       Neg aside from HPI   Musculoskeletal:       Left below the knee amputation  Skin: Negative.   Allergic/Immunologic: Positive for immunocompromised state.  Neurological: Positive for weakness. Negative for syncope.     Physical Exam Updated Vital Signs BP (!) 104/57   Pulse (!) 54   Temp (!) 97.5 F (36.4 C)  (Oral)   Resp 15   SpO2 100%   Physical Exam Vitals signs and nursing note reviewed.  Constitutional:      Appearance: He is well-developed. He is ill-appearing and diaphoretic.     Comments: Ill-appearing adult male awake and alert  HENT:     Head: Normocephalic and atraumatic.  Eyes:     Conjunctiva/sclera: Conjunctivae normal.  Cardiovascular:     Rate and Rhythm: Tachycardia present. Rhythm irregular.  Pulmonary:     Breath sounds: Rhonchi present.  Musculoskeletal:     Comments: Prior left AKA  Skin:    General: Skin is warm.  Neurological:     Mental Status: He is alert and oriented to person, place, and time.      ED Treatments / Results  Labs (all labs ordered are listed, but only abnormal results are displayed) Labs Reviewed  COMPREHENSIVE METABOLIC PANEL - Abnormal; Notable for the following components:      Result Value   Sodium 134 (*)    Potassium 5.6 (*)    Chloride 93 (*)    Glucose, Bld 148 (*)    BUN 48 (*)    Creatinine, Ser 5.43 (*)    Calcium 8.8 (*)    Total Protein 6.1 (*)    Albumin 2.9 (*)    Alkaline Phosphatase 133 (*)    Total Bilirubin 2.2 (*)    GFR calc non Af Amer 9 (*)    GFR calc Af Amer 11 (*)    Anion gap 17 (*)    All other components within normal limits  CBC WITH DIFFERENTIAL/PLATELET - Abnormal; Notable for the following components:   RBC 3.92 (*)    Hemoglobin 12.2 (*)    HCT 37.3 (*)    RDW 18.1 (*)  Monocytes Absolute 1.3 (*)    All other components within normal limits  BRAIN NATRIURETIC PEPTIDE - Abnormal; Notable for the following components:   B Natriuretic Peptide 2,597.8 (*)    All other components within normal limits  SARS CORONAVIRUS 2 (HOSPITAL ORDER, [place] [facility])    EKG EKG Interpretation  Date/Time:  Saturday August 29 2019 08:47:37 EDT Ventricular Rate:  55 PR Interval:    QRS Duration: 234 QT Interval:  549 QTC Calculation: 526 R Axis:   -80 Text Interpretation:   VENTRICULAR PACED RHYTHM Artifact Abnormal ECG Confirmed by Carmin Muskrat [phone]) on 08/29/2019 9:05:41 AM   Radiology Dg Chest Port 1 View  Result Date: 08/29/2019 CLINICAL DATA:  Shortness of breath EXAM: PORTABLE CHEST 1 VIEW COMPARISON:  08/15/2019 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 size is enlarged. Aortic calcifications are noted. There is no pneumothorax. No acute osseous abnormality. There is a trace right-sided pleural effusion. IMPRESSION: No active disease. Electronically Signed   By: Constance Holster M.D.   On: 08/29/2019 09:38    Procedures Procedures (including critical care time)  Medications Ordered in ED Medications - No data to display   Initial Impression / Assessment and Plan / ED Course  I have reviewed the triage vital signs and the nursing notes.  Pertinent labs & imaging results that were available during my care of the patient were reviewed by me and considered in my medical decision making (see chart for details).        I discussed the patient's case with our nephrology colleagues given the patient's recent dialysis, while here, in the ICU. With concern for fluid overload status, respiratory compromise at home, elevated BNP, right-sided effusion, the patient will be admitted, go to dialysis.  10:51 AM Elderly male with multiple medical issues including dialysis dependency, presents from home with weakness, there was in respiratory distress, but has improved by the time of ED arrival. However, with recent incomplete dialysis session, hyperkalemia,, fluid overloaded status, patient will require admission for further monitoring, management. No early evidence for concurrent new disease, though COVID test is pending on admission.  11:21 AM I had another conversation with the patient, and his wife. He is apparently already enrolled in palliative services, but is hesitant to enroll with hospice, he is equivocal about  designating himself DNR. However, the patient is adamant that he does not want to stay in the hospital, does not want to be dialyzed here today, wants to go home. Patient has capacity to make this decision. He notes that he is aware his health status is declining, states that he wants to enjoy as much time as possible at home, without additional hospitalizations. Both the patient's wife, and daughter, via telephone were present throughout these conversations, and per his request, he was discharged, to follow-up with dialysis as an outpatient, possibly even this afternoon.  Final Clinical Impressions(s) / ED Diagnoses   Final diagnoses:  Shortness of breath  Hyperkalemia     Carmin Muskrat, MD 08/29/19 1122

## 2019-08-29 NOTE — Discharge Instructions (Signed)
Please be sure to call your dialysis center, and either have a session today or Monday.  As we discussed, palliative and hospice services are both appropriate considerations, and you may consider having a conversation about these with your physician later this week.

## 2019-08-29 NOTE — ED Triage Notes (Signed)
Pt arrives with [facility] from home c/o increased SHOB and hypotension. Upon EMS arrival, pt's O2 sat was 76% on 4L. Pt was placed on 15L non-rebreather with O2 sat was 100%. Pt is a dialysis pt; last tx was on Thursday; pt was able to complete only 1.5 hours out of 4 hours due to [facility]. Pt normally wears 3L O2 at home. Pt was hypotensive with EMS, initially 80/40; pt was given 250 ml fluid bolus.; BP improved to 101/59.

## 2019-08-29 NOTE — ED Notes (Signed)
ED Provider at bedside. 

## 2019-08-31 DIAGNOSIS — N2581 Secondary hyperparathyroidism of renal origin: Secondary | ICD-10-CM | POA: Diagnosis not present

## 2019-08-31 DIAGNOSIS — E8770 Fluid overload, unspecified: Secondary | ICD-10-CM | POA: Diagnosis not present

## 2019-08-31 DIAGNOSIS — R52 Pain, unspecified: Secondary | ICD-10-CM | POA: Diagnosis not present

## 2019-08-31 DIAGNOSIS — J449 Chronic obstructive pulmonary disease, unspecified: Secondary | ICD-10-CM | POA: Diagnosis not present

## 2019-08-31 DIAGNOSIS — N186 End stage renal disease: Secondary | ICD-10-CM | POA: Diagnosis not present

## 2019-08-31 DIAGNOSIS — D631 Anemia in chronic kidney disease: Secondary | ICD-10-CM | POA: Diagnosis not present

## 2019-08-31 DIAGNOSIS — E0822 Diabetes mellitus due to underlying condition with diabetic chronic kidney disease: Secondary | ICD-10-CM | POA: Diagnosis not present

## 2019-08-31 DIAGNOSIS — D509 Iron deficiency anemia, unspecified: Secondary | ICD-10-CM | POA: Diagnosis not present

## 2019-08-31 DIAGNOSIS — R197 Diarrhea, unspecified: Secondary | ICD-10-CM | POA: Diagnosis not present

## 2019-08-31 DIAGNOSIS — Z992 Dependence on renal dialysis: Secondary | ICD-10-CM | POA: Diagnosis not present

## 2019-08-31 DIAGNOSIS — E8779 Other fluid overload: Secondary | ICD-10-CM | POA: Diagnosis not present

## 2019-08-31 DIAGNOSIS — I959 Hypotension, unspecified: Secondary | ICD-10-CM | POA: Diagnosis not present

## 2019-08-31 DIAGNOSIS — Z87891 Personal history of nicotine dependence: Secondary | ICD-10-CM | POA: Diagnosis not present

## 2019-09-01 DIAGNOSIS — D509 Iron deficiency anemia, unspecified: Secondary | ICD-10-CM | POA: Diagnosis not present

## 2019-09-01 DIAGNOSIS — I959 Hypotension, unspecified: Secondary | ICD-10-CM | POA: Diagnosis not present

## 2019-09-01 DIAGNOSIS — N2581 Secondary hyperparathyroidism of renal origin: Secondary | ICD-10-CM | POA: Diagnosis not present

## 2019-09-01 DIAGNOSIS — E0822 Diabetes mellitus due to underlying condition with diabetic chronic kidney disease: Secondary | ICD-10-CM | POA: Diagnosis not present

## 2019-09-01 DIAGNOSIS — D631 Anemia in chronic kidney disease: Secondary | ICD-10-CM | POA: Diagnosis not present

## 2019-09-01 DIAGNOSIS — E8770 Fluid overload, unspecified: Secondary | ICD-10-CM | POA: Diagnosis not present

## 2019-09-01 DIAGNOSIS — R197 Diarrhea, unspecified: Secondary | ICD-10-CM | POA: Diagnosis not present

## 2019-09-01 DIAGNOSIS — E8779 Other fluid overload: Secondary | ICD-10-CM | POA: Diagnosis not present

## 2019-09-01 DIAGNOSIS — J449 Chronic obstructive pulmonary disease, unspecified: Secondary | ICD-10-CM | POA: Diagnosis not present

## 2019-09-01 DIAGNOSIS — Z992 Dependence on renal dialysis: Secondary | ICD-10-CM | POA: Diagnosis not present

## 2019-09-01 DIAGNOSIS — Z87891 Personal history of nicotine dependence: Secondary | ICD-10-CM | POA: Diagnosis not present

## 2019-09-01 DIAGNOSIS — N186 End stage renal disease: Secondary | ICD-10-CM | POA: Diagnosis not present

## 2019-09-01 DIAGNOSIS — R52 Pain, unspecified: Secondary | ICD-10-CM | POA: Diagnosis not present

## 2019-09-02 ENCOUNTER — Other Ambulatory Visit: Payer: Self-pay

## 2019-09-02 ENCOUNTER — Encounter (HOSPITAL_BASED_OUTPATIENT_CLINIC_OR_DEPARTMENT_OTHER): Payer: Medicare Other | Attending: Physician Assistant | Admitting: Physician Assistant

## 2019-09-02 DIAGNOSIS — Z87891 Personal history of nicotine dependence: Secondary | ICD-10-CM | POA: Diagnosis not present

## 2019-09-02 DIAGNOSIS — I87312 Chronic venous hypertension (idiopathic) with ulcer of left lower extremity: Secondary | ICD-10-CM | POA: Diagnosis not present

## 2019-09-02 DIAGNOSIS — N186 End stage renal disease: Secondary | ICD-10-CM | POA: Diagnosis not present

## 2019-09-02 DIAGNOSIS — X58XXXA Exposure to other specified factors, initial encounter: Secondary | ICD-10-CM | POA: Diagnosis not present

## 2019-09-02 DIAGNOSIS — I252 Old myocardial infarction: Secondary | ICD-10-CM | POA: Diagnosis not present

## 2019-09-02 DIAGNOSIS — E1122 Type 2 diabetes mellitus with diabetic chronic kidney disease: Secondary | ICD-10-CM | POA: Diagnosis not present

## 2019-09-02 DIAGNOSIS — I509 Heart failure, unspecified: Secondary | ICD-10-CM | POA: Diagnosis not present

## 2019-09-02 DIAGNOSIS — S31501A Unspecified open wound of unspecified external genital organs, male, initial encounter: Secondary | ICD-10-CM | POA: Diagnosis not present

## 2019-09-02 DIAGNOSIS — L97812 Non-pressure chronic ulcer of other part of right lower leg with fat layer exposed: Secondary | ICD-10-CM | POA: Insufficient documentation

## 2019-09-02 DIAGNOSIS — Z89512 Acquired absence of left leg below knee: Secondary | ICD-10-CM | POA: Insufficient documentation

## 2019-09-02 DIAGNOSIS — E1151 Type 2 diabetes mellitus with diabetic peripheral angiopathy without gangrene: Secondary | ICD-10-CM | POA: Insufficient documentation

## 2019-09-02 DIAGNOSIS — E11622 Type 2 diabetes mellitus with other skin ulcer: Secondary | ICD-10-CM | POA: Insufficient documentation

## 2019-09-02 DIAGNOSIS — J449 Chronic obstructive pulmonary disease, unspecified: Secondary | ICD-10-CM | POA: Insufficient documentation

## 2019-09-02 DIAGNOSIS — I872 Venous insufficiency (chronic) (peripheral): Secondary | ICD-10-CM | POA: Insufficient documentation

## 2019-09-02 DIAGNOSIS — S31829A Unspecified open wound of left buttock, initial encounter: Secondary | ICD-10-CM | POA: Insufficient documentation

## 2019-09-02 DIAGNOSIS — L97519 Non-pressure chronic ulcer of other part of right foot with unspecified severity: Secondary | ICD-10-CM | POA: Diagnosis not present

## 2019-09-02 DIAGNOSIS — I132 Hypertensive heart and chronic kidney disease with heart failure and with stage 5 chronic kidney disease, or end stage renal disease: Secondary | ICD-10-CM | POA: Diagnosis not present

## 2019-09-02 DIAGNOSIS — L97312 Non-pressure chronic ulcer of right ankle with fat layer exposed: Secondary | ICD-10-CM | POA: Insufficient documentation

## 2019-09-02 NOTE — Progress Notes (Addendum)
YAMEN, HAITH ([id number]) Visit Report for 09/02/2019 Chief Complaint Document Details Patient Name: Date of Service: Jeffrey [name], Jeffrey [name] 09/02/2019 11:00 AM Medical Record [id number] Patient Account Number: [id number] Date of Birth/Sex: Treating RN: June 28, 1941 (78 y.o. [name] Primary Care Provider: [name] Other Clinician: Referring Provider: Treating Provider/Extender:[name], [name], [name]: 8 Information Obtained from: Patient Chief Complaint Bilateral LE Ulcers Electronic Signature(s) Signed: 09/02/2019 11:09:50 AM By: Worthy Keeler PA-C Entered By: Worthy Keeler on 09/02/2019 11:09:50 -------------------------------------------------------------------------------- HPI Details Patient Name: Date of Service: Jeffrey [name], Jeffrey [name] 09/02/2019 11:00 AM Medical Record [id number] Patient Account Number: [id number] Date of Birth/Sex: Treating RN: 05/15/1941 (78 y.o. [name] Primary Care Provider: [name] Other Clinician: Referring Provider: Treating Provider/Extender:[name], [name], [name]: 8 History of Present Illness HPI Description: 07/08/2019 on evaluation today patient presents for initial evaluation in clinic concerning issues that began about 3 weeks ago he states. He recently underwent a left BKA to the under the care of Dr. Doren Custard and this was on Apr 14, 2019. Subsequently he developed an area of what appears to be abrasion just below this amputation site which they feel occurred as a result of utilization of the shrinker. With that being said the good news is this does not really appear to be doing too poorly at all. The bigger issue is that he has multiple ulcers of the right lower extremity where 2 of the areas appear to be more blisters and in fact 1 of them we did not even count as it does not appear to be an open wound at all. The other site is on the lateral  malleolus and I believe this may be not just venous related but potentially some pressure component to it as well. Nonetheless it does sound like he sleeps in his chair with his leg up on a stool and subsequently this causes some pressure to the lateral portion of his ankle as it does press on the stool his wife tells me. He is not able to lay flat such as in the bed or even reclined back in his recliner due to issues with his breathing. 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 He also has congestive heart failure. Currently they have been using meta honey on the wounds until they ran out prior to coming here. His most recent hemoglobin A1c at dialysis was 7.2. 07/15/2019 upon evaluation today patient appears to be doing overall quite poorly well with regard to his left distal stump ulcer. This has healed they have not been using the shrinker and fortunately this healed quite nicely. She did not try the foam underneath the shrinker she was afraid that it would cause too much moisture. This is his daughter. Subsequently in regard to the right lower extremity patient actually has been having to change this twice a day most days. Unfortunately he has a lot of drainage and the dressings that are putting on just do not seem to be lasting. Nonetheless I feel like we may need to initiate some kind of compression although we have to be very cautious with this based on his arterial study which shows that he does have a TBI of 0.43 this is not in the best range obviously. 07/22/2019 on evaluation today  patient appears to be doing decently well with regard to his wounds. I do not see any evidence of active infection at this time which is good news. Fortunately there is no signs of worsening and  in fact his wounds actually appear to be doing better which is great news. No fevers, chills, nausea, vomiting, or diarrhea. 07/29/19 on evaluation today patient actually appears to be doing quite well with regard to his wounds currently on the right lateral leg and right medial leg. With that being said the right lateral malleolus did have some Slough noted upon evaluation today this is gonna need to be addressed by way of sharp debridement. With that being said I feel like that we can appropriately debride this without complication which is good news. He has some discomfort but nothing too significant. 08/05/2019 on evaluation today patient actually appears to be doing quite well with regard to his left medial lower extremity ulcer and his lateral lower extremity ulcer is completely healed. With that being said he is having some issues still with the right lateral ankle I think this is simply due to the fact that he is constantly putting pressure on this when he sleeping at night or even when he is in the dialysis chair for that reason during the day. Nonetheless I believe that he would benefit from having more appropriate offloading possibly even a Prevalon boot could be of benefit to him. 08/12/2019 on evaluation today patient appears to be doing better in regard to his right lower extremity ulcers in general which is good news. Unfortunately in regard to his left lower extremity he appears to be doing more poorly today at this time. In fact the area that we were concerned about we were trying to protect last week is opened up into a true open wound from the blister that is shown up. He also has a second blister on the distal aspect of his stump which is open at this point in time just draining a little bit but not completely cleared away as far as the blistered skin is concerned. Subsequently we will get to keep an eye on this that may need to be removed or come off itself by next week we shall  see. 08/26/2019 on evaluation today patient actually was in the hospital for a period of time from 08/14/2019 through 08/20/2019. Unfortunately he had several episodes of shortness of breath with intermittent chest pain and altered mental status with decreased level of consciousness and EMS was called. He was evaluated in the hospital where throughout the hospital course he had multiple issues including some trouble with NSVT but no persistent SVT. Cardiology consider cardioversion at one point but then did not. Apparently wound care did see him for his ulcers on his lower extremities although the patient states he really was not impressed with what they were doing. He does have follow-up appointments with cardiology upon discharge as well as continuing with dialysis and seen his primary care provider in 1 to 2 weeks due to his T3 and T4 levels since he did have an elevated TSH in the hospital. His wounds today appear to be doing fairly well and in fact the left stump wounds are dramatically improved compared to when I last saw him. On the right the original wounds seem to be doing slightly better although he does have a new wound on his toe unfortunately. This is the right second toe. 09/02/2019 on evaluation today patient appears to be doing roughly about  the same in regard to his wounds in general. He has not really made significant improvements but all in all things appear to be fairly stable which is good news. Unfortunately he was in dialysis when his blood pressure and oxygen saturation dropped recently he ended up going to the hospital. He was seen in the emergency department where on 08/29/2019 he was told by the physician who was caring for him and his wife was told as well that they should call hospice in as he was not likely to live much longer. Nonetheless that is something that his wife and daughter state they do not want to do obviously under hospice he tells me the biggest issue is he is  not allowed to have dialysis which again would lead to his imminent demise. Nonetheless at this point he wants to try it as much as possible enjoy the remaining time that he has with his family which I can respect. In the meantime were trying to manage his wounds as much as we can and as well as we can though again I am not sure how likely he will be to heal everything. Electronic Signature(s) Signed: 09/02/2019 12:43:41 PM By: Worthy Keeler PA-C Entered By: Worthy Keeler on 09/02/2019 12:43:41 -------------------------------------------------------------------------------- Physical Exam Details Patient Name: Date of Service: Jeffrey [name], Jeffrey [name] 09/02/2019 11:00 AM Medical Record [id number] Patient Account Number: [id number] Date of Birth/Sex: Treating RN: Nov 28, 1940 (78 y.o. [name] Primary Care Provider: [name] Other Clinician: Referring Provider: Treating Provider/Extender:[name], [name], [name]: 8 Constitutional Well-nourished and well-hydrated in no acute distress. Respiratory normal breathing without difficulty. clear to auscultation bilaterally. Cardiovascular regular rate and rhythm with normal S1, S2. Psychiatric this patient is able to make decisions and demonstrates good insight into disease process. Alert and Oriented x 3. pleasant and cooperative. Notes Upon inspection today patient's wound bed showed signs at most locations of fairly good granulation the right lateral ankle still has some slough noted and there was eschar over the distal portion of his toe on the right as well. Otherwise his wounds seem to be doing fairly okay in my opinion. He still has significant edema but again they can only take off some much fluid in dialysis due to his blood pressure dropping. Electronic Signature(s) Signed: 09/02/2019 12:44:23 PM By: Worthy Keeler PA-C Entered By: Worthy Keeler on 09/02/2019  12:44:23 -------------------------------------------------------------------------------- Physician Orders Details Patient Name: Date of Service: Jeffrey [name], Jeffrey [name] 09/02/2019 11:00 AM Medical Record [id number] Patient Account Number: [id number] Date of Birth/Sex: Treating RN: [birth year] (78 y.o. [name] Primary Care Provider: [name] Other Clinician: Referring Provider: Treating Provider/Extender:[name], [name], [name]: 8 Verbal / Phone Orders: No Diagnosis Coding ICD-10 Coding Code Description I87.2 Venous insufficiency (chronic) (peripheral) I73.9 Peripheral vascular disease, unspecified L97.312 Non-pressure chronic ulcer of right ankle with fat layer exposed L97.812 Non-pressure chronic ulcer of other part of right lower leg with fat layer exposed Z89.512 Acquired absence of left leg below knee T81.31XA Disruption of external operation (surgical) wound, not elsewhere classified, initial encounter E11.622 Type 2 diabetes mellitus with other skin ulcer N18.6 End stage renal disease Z99.2 Dependence on renal dialysis Follow-up Appointments Return Appointment in 1 week. Dressing Change Frequency Wound #1 Right,Lateral Malleolus Do not change entire dressing for one week. Wound #4 Right,Medial Lower Leg Do not change entire dressing for one week. Wound #5 Amputation Site - Below Knee Change Dressing every other day. Wound #6  Right,Lateral Lower Leg Do not change entire dressing for one week. Wound #7 Right Toe Second Change Dressing every other day. Skin Barriers/Peri-Wound Care Barrier cream - mix with TCA cream to lower part of right lower leg TCA Cream or Ointment Wound Cleansing Wound #5 Amputation Site - Below Knee Clean wound with Wound Cleanser Wound #7 Right Toe Second Clean wound with Wound Cleanser Wound #1 Right,Lateral Malleolus May shower with protection. Primary Wound Dressing Wound #1 Right,Lateral  Malleolus Calcium Alginate with Silver Wound #4 Right,Medial Lower Leg Calcium Alginate with Silver Wound #5 Amputation Site - Below Knee Calcium Alginate with Silver Wound #6 Right,Lateral Lower Leg Calcium Alginate with Silver Wound #7 Right Toe Second Calcium Alginate with Silver Secondary Dressing Wound #1 Right,Lateral Malleolus Dry Gauze Wound #4 Right,Medial Lower Leg Dry Gauze Kerramax Wound #5 Amputation Site - Below Knee ABD pad Wound #6 Right,Lateral Lower Leg Dry Gauze ABD pad Wound #7 Right Toe Second Kerlix/Rolled Gauze Dry Gauze Edema Control Kerlix and Coban - Right Lower Extremity - do not wrap too tight Elevate legs to the level of the heart or above for 30 minutes daily and/or when sitting, a frequency of: - throughout the day Other: - stump shrinker or ace wrap to left stump to hold dressing in place Off-Loading Other: - do not let ankle roll out to the side, no pressure on right lateral ankle, use piece of thick foam with hole cut out for ankle especially at night Electronic Signature(s) Signed: 09/03/2019 12:09:12 PM By: Baruch Gouty RN, BSN Signed: 09/06/2019 10:37:03 PM By: Worthy Keeler PA-C Entered By: Baruch Gouty on 09/02/2019 12:37:42 -------------------------------------------------------------------------------- Problem List Details Patient Name: Date of Service: Jeffrey [name] 09/02/2019 11:00 AM Medical Record [id number] Patient Account Number: [id number] Date of Birth/Sex: Treating RN: Jul 30, 1941 (78 y.o. [name] Primary Care Provider: [name] Other Clinician: Referring Provider: Treating Provider/Extender:[name], [name], [name]: 8 Active Problems ICD-10 Evaluated Encounter Code Description Active Date Today Diagnosis I87.2 Venous insufficiency (chronic) (peripheral) 07/08/2019 No Yes I73.9 Peripheral vascular disease, unspecified 07/08/2019 No Yes L97.312  Non-pressure chronic ulcer of right ankle with fat layer 07/08/2019 No Yes exposed L97.812 Non-pressure chronic ulcer of other part of right lower 07/08/2019 No Yes leg with fat layer exposed Z89.512 Acquired absence of left leg below knee 07/08/2019 No Yes T81.31XA Disruption of external operation (surgical) wound, not 07/08/2019 No Yes elsewhere classified, initial encounter E11.622 Type 2 diabetes mellitus with other skin ulcer 07/08/2019 No Yes N18.6 End stage renal disease 07/08/2019 No Yes Z99.2 Dependence on renal dialysis 07/08/2019 No Yes Inactive Problems Resolved Problems Electronic Signature(s) Signed: 09/02/2019 11:09:43 AM By: Worthy Keeler PA-C Entered By: Worthy Keeler on 09/02/2019 11:09:43 -------------------------------------------------------------------------------- Progress Note Details Patient Name: Date of Service: Jeffrey [name] 09/02/2019 11:00 AM Medical Record [id number] Patient Account Number: [id number] Date of Birth/Sex: Treating RN: Feb 14, 1941 (78 y.o. [name] Primary Care Provider: [name] Other Clinician: Referring Provider: Treating Provider/Extender:[name], [name], [name]: 8 Subjective Chief Complaint Information obtained from Patient Bilateral LE Ulcers History of Present Illness (HPI) 07/08/2019 on evaluation today patient presents for initial evaluation in clinic concerning issues that began about 3 weeks ago he states. He recently underwent a left BKA to the under the care of Dr. Doren Custard and this was on Apr 14, 2019. Subsequently he developed an area of what appears to be abrasion just below this amputation site which they feel occurred as a  result of utilization of the shrinker. With that being said the good news is this does not really appear to be doing too poorly at all. The bigger issue is that he has multiple ulcers of the right lower extremity where 2 of the areas appear to be more  blisters and in fact 1 of them we did not even count as it does not appear to be an open wound at all. The other site is on the lateral malleolus and I believe this may be not just venous related but potentially some pressure component to it as well. Nonetheless it does sound like he sleeps in his chair with his leg up on a stool and subsequently this causes some pressure to the lateral portion of his ankle as it does press on the stool his wife tells me. He is not able to lay flat such as in the bed or even reclined back in his recliner due to issues with his breathing. 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 He also has congestive heart failure. Currently they have been using meta honey on the wounds until they ran out prior to coming here. His most recent hemoglobin A1c at dialysis was 7.2. 07/15/2019 upon evaluation today patient appears to be doing overall quite poorly well with regard to his left distal stump ulcer. This has healed they have not been using the shrinker and fortunately this healed quite nicely. She did not try the foam underneath the shrinker she was afraid that it would cause too much moisture. This is his daughter. Subsequently in regard to the right lower extremity patient actually has been having to change this twice a day most days. Unfortunately he has a lot of drainage and the dressings that are putting on just do not seem to be lasting. Nonetheless I feel like we may need to initiate some kind of compression although we have to be very cautious with this based on his arterial study which shows that he does have a TBI of 0.43 this is not in the best range obviously. 07/22/2019 on evaluation today patient appears to be doing decently well with regard to his  wounds. I do not see any evidence of active infection at this time which is good news. Fortunately there is no signs of worsening and in fact his wounds actually appear to be doing better which is great news. No fevers, chills, nausea, vomiting, or diarrhea. 07/29/19 on evaluation today patient actually appears to be doing quite well with regard to his wounds currently on the right lateral leg and right medial leg. With that being said the right lateral malleolus did have some Slough noted upon evaluation today this is gonna need to be addressed by way of sharp debridement. With that being said I feel like that we can appropriately debride this without complication which is good news. He has some discomfort but nothing too significant. 08/05/2019 on evaluation today patient actually appears to be doing quite well with regard to his left medial lower extremity ulcer and his lateral lower extremity ulcer is completely healed. With that being said he is having some issues still with the right lateral ankle I think this is simply due to the fact that he is constantly putting pressure on this when he sleeping at night or even  when he is in the dialysis chair for that reason during the day. Nonetheless I believe that he would benefit from having more appropriate offloading possibly even a Prevalon boot could be of benefit to him. 08/12/2019 on evaluation today patient appears to be doing better in regard to his right lower extremity ulcers in general which is good news. Unfortunately in regard to his left lower extremity he appears to be doing more poorly today at this time. In fact the area that we were concerned about we were trying to protect last week is opened up into a true open wound from the blister that is shown up. He also has a second blister on the distal aspect of his stump which is open at this point in time just draining a little bit but not completely cleared away as far as the blistered skin  is concerned. Subsequently we will get to keep an eye on this that may need to be removed or come off itself by next week we shall see. 08/26/2019 on evaluation today patient actually was in the hospital for a period of time from 08/14/2019 through 08/20/2019. Unfortunately he had several episodes of shortness of breath with intermittent chest pain and altered mental status with decreased level of consciousness and EMS was called. He was evaluated in the hospital where throughout the hospital course he had multiple issues including some trouble with NSVT but no persistent SVT. Cardiology consider cardioversion at one point but then did not. Apparently wound care did see him for his ulcers on his lower extremities although the patient states he really was not impressed with what they were doing. He does have follow-up appointments with cardiology upon discharge as well as continuing with dialysis and seen his primary care provider in 1 to 2 weeks due to his T3 and T4 levels since he did have an elevated TSH in the hospital. His wounds today appear to be doing fairly well and in fact the left stump wounds are dramatically improved compared to when I last saw him. On the right the original wounds seem to be doing slightly better although he does have a new wound on his toe unfortunately. This is the right second toe. 09/02/2019 on evaluation today patient appears to be doing roughly about the same in regard to his wounds in general. He has not really made significant improvements but all in all things appear to be fairly stable which is good news. Unfortunately he was in dialysis when his blood pressure and oxygen saturation dropped recently he ended up going to the hospital. He was seen in the emergency department where on 08/29/2019 he was told by the physician who was caring for him and his wife was told as well that they should call hospice in as he was not likely to live much longer. Nonetheless that  is something that his wife and daughter state they do not want to do obviously under hospice he tells me the biggest issue is he is not allowed to have dialysis which again would lead to his imminent demise. Nonetheless at this point he wants to try it as much as possible enjoy the remaining time that he has with his family which I can respect. In the meantime were trying to manage his wounds as much as we can and as well as we can though again I am not sure how likely he will be to heal everything. Patient History Unable to Obtain Patient History due to Altered Mental Status.  Information obtained from Patient. Family History Diabetes - Father, No family history of Cancer, Heart Disease, Hereditary Spherocytosis, Hypertension, Kidney Disease, Lung Disease, Seizures, Stroke, Thyroid Problems, Tuberculosis. Social History Former smoker, Marital Status - Married, Alcohol Use - Rarely, Drug Use - No History, Caffeine Use - Never. Medical History Eyes Patient has history of Cataracts Denies history of Glaucoma, Optic Neuritis Ear/Nose/Mouth/Throat Denies history of Chronic sinus problems/congestion, Middle ear problems Hematologic/Lymphatic Denies history of Anemia, Hemophilia, Human Immunodeficiency Virus, Lymphedema, Sickle Cell Disease Respiratory Patient has history of Chronic Obstructive Pulmonary Disease (COPD) Denies history of Aspiration, Asthma, Pneumothorax, Sleep Apnea, Tuberculosis Cardiovascular Patient has history of Congestive Heart Failure, Hypertension, Myocardial Infarction, Peripheral Venous Disease Denies history of Angina, Arrhythmia, Coronary Artery Disease, Deep Vein Thrombosis, Hypotension, Peripheral Arterial Disease, Phlebitis, Vasculitis Gastrointestinal Denies history of Cirrhosis , Colitis, Crohnoos, Hepatitis A, Hepatitis B, Hepatitis C Endocrine Patient has history of Type II Diabetes Denies history of Type I Diabetes Genitourinary Patient has history of  End Stage Renal Disease Immunological Denies history of Lupus Erythematosus, Raynaudoos, Scleroderma Integumentary (Skin) Denies history of History of Burn Musculoskeletal Patient has history of Osteomyelitis - left foot Denies history of Gout, Rheumatoid Arthritis, Osteoarthritis Neurologic Denies history of Dementia, Neuropathy, Quadriplegia, Paraplegia, Seizure Disorder Oncologic Denies history of Received Chemotherapy, Received Radiation Psychiatric Denies history of Anorexia/bulimia, Confinement Anxiety Hospitalization/Surgery History - 08/14/2019 [place]- faint/ decreased LOC/ chest pain. Medical And Surgical History Notes Genitourinary dialysis Review of Systems (ROS) Respiratory Denies complaints or symptoms of Chronic or frequent coughs, Shortness of Breath. Cardiovascular Denies complaints or symptoms of Chest pain. Psychiatric Denies complaints or symptoms of Claustrophobia, Suicidal. Objective Constitutional Well-nourished and well-hydrated in no acute distress. Vitals Time Taken: 11:51 AM, Height: 71 in, Weight: 209 lbs, BMI: 29.1, Temperature: 97.8 F, Pulse: 56 bpm, Respiratory Rate: 18 breaths/min, Blood Pressure: 70/50 mmHg. Respiratory normal breathing without difficulty. clear to auscultation bilaterally. Cardiovascular regular rate and rhythm with normal S1, S2. Psychiatric this patient is able to make decisions and demonstrates good insight into disease process. Alert and Oriented x 3. pleasant and cooperative. General Notes: Upon inspection today patient's wound bed showed signs at most locations of fairly good granulation the right lateral ankle still has some slough noted and there was eschar over the distal portion of his toe on the right as well. Otherwise his wounds seem to be doing fairly okay in my opinion. He still has significant edema but again they can only take off some much fluid in dialysis due to his blood pressure  dropping. Integumentary (Hair, Skin) Wound #1 status is Open. Original cause of wound was Gradually Appeared. The wound is located on the Right,Lateral Malleolus. The wound measures 1cm length x 1.3cm width x 0.2cm depth; 1.021cm^2 area and 0.204cm^3 volume. There is Fat Layer (Subcutaneous Tissue) Exposed exposed. There is no tunneling or undermining noted. There is a small amount of serosanguineous drainage noted. The wound margin is distinct with the outline attached to the wound base. There is medium (34-66%) pink granulation within the wound bed. There is a medium (34-66%) amount of necrotic tissue within the wound bed including Adherent Slough. Wound #4 status is Open. Original cause of wound was Blister. The wound is located on the Right,Medial Lower Leg. The wound measures 3.5cm length x 3.3cm width x 0.1cm depth; 9.071cm^2 area and 0.907cm^3 volume. There is Fat Layer (Subcutaneous Tissue) Exposed exposed. There is no tunneling or undermining noted. There is a medium amount of serosanguineous drainage noted. The  wound margin is distinct with the outline attached to the wound base. There is large (67-100%) red granulation within the wound bed. There is a small (1-33%) amount of necrotic tissue within the wound bed including Adherent Slough. Wound #5 status is Open. Original cause of wound was Gradually Appeared. The wound is located on the Amputation Site - Below Knee. The wound measures 3cm length x 8cm width x 0.1cm depth; 18.85cm^2 area and 1.885cm^3 volume. There is Fat Layer (Subcutaneous Tissue) Exposed exposed. There is no tunneling or undermining noted. There is a small amount of serosanguineous drainage noted. The wound margin is distinct with the outline attached to the wound base. There is large (67-100%) granulation within the wound bed. There is no necrotic tissue within the wound bed. Wound #6 status is Open. Original cause of wound was Gradually Appeared. The wound is  located on the Right,Lateral Lower Leg. The wound measures 15cm length x 4cm width x 0.1cm depth; 47.124cm^2 area and 4.712cm^3 volume. There is Fat Layer (Subcutaneous Tissue) Exposed exposed. There is no tunneling or undermining noted. There is a small amount of serosanguineous drainage noted. The wound margin is distinct with the outline attached to the wound base. There is large (67-100%) red granulation within the wound bed. There is no necrotic tissue within the wound bed. Wound #7 status is Open. Original cause of wound was Gradually Appeared. The wound is located on the Right Toe Second. The wound measures 1cm length x 1cm width x 0.1cm depth; 0.785cm^2 area and 0.079cm^3 volume. There is no tunneling or undermining noted. There is a medium amount of serosanguineous drainage noted. The wound margin is distinct with the outline attached to the wound base. There is medium (34-66%) red, pink granulation within the wound bed. There is a medium (34-66%) amount of necrotic tissue within the wound bed including Eschar and Adherent Slough. 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 Dressing Change Frequency: Wound #1 Right,Lateral Malleolus: Do not change entire dressing for one week. Wound #4 Right,Medial Lower Leg: Do not change entire dressing for one week. Wound #5 Amputation Site - Below Knee: Change Dressing every other day. Wound #6 Right,Lateral Lower Leg: Do not  change entire dressing for one week. Wound #7 Right Toe Second: Change Dressing every other day. Skin Barriers/Peri-Wound Care: Barrier cream - mix with TCA cream to lower part of right lower leg TCA Cream or Ointment Wound Cleansing: Wound #5 Amputation Site - Below Knee: Clean wound with Wound Cleanser Wound #7 Right Toe Second: Clean wound with Wound Cleanser Wound #1 Right,Lateral Malleolus: May shower with protection. Primary Wound Dressing: Wound #1 Right,Lateral Malleolus: Calcium Alginate with Silver Wound #4 Right,Medial Lower Leg: Calcium Alginate with Silver Wound #5 Amputation Site - Below Knee: Calcium Alginate with Silver Wound #6 Right,Lateral Lower Leg: Calcium Alginate with Silver Wound #7 Right Toe Second: Calcium Alginate with Silver Secondary Dressing: Wound #1 Right,Lateral Malleolus: Dry Gauze Wound #4 Right,Medial Lower Leg: Dry Gauze [place] Wound #5 Amputation Site - Below Knee: ABD pad Wound #6 Right,Lateral Lower Leg: Dry Gauze ABD pad Wound #  7 Right Toe Second: Kerlix/Rolled Gauze Dry Gauze Edema Control: Kerlix and Coban - Right Lower Extremity - do not wrap too tight Elevate legs to the level of the heart or above for 30 minutes daily and/or when sitting, a frequency of: - throughout the day Other: - stump shrinker or ace wrap to left stump to hold dressing in place Off-Loading: Other: - do not let ankle roll out to the side, no pressure on right lateral ankle, use piece of thick foam with hole cut out for ankle especially at night Per 1 my suggestion at this time is can be that we continue with silver alginate dressings to all wound locations he is in agreement with the plan. We will use a light Kerlix and Coban wrap for the right lower extremity again he does not have good blood flow but this has helped him at least to some degree. 2. Were also going to continue to utilize the shrinker for the left stump which does help with some of  the edema here as well and has help that to improve to some degree. 3. As far as his wounds are concerned I explained again today that I am not sure we will be able to get everything to completely heal but we will continue to manage and help him as best we can for the time being until he either heals or no longer requires or services. We will see patient back for reevaluation in 1 week here in the clinic. If anything worsens or changes patient will contact our office for additional recommendations. Electronic Signature(s) Signed: 09/02/2019 12:45:06 PM By: Worthy Keeler PA-C Entered By: Worthy Keeler on 09/02/2019 12:45:05 -------------------------------------------------------------------------------- HxROS Details Patient Name: Date of Service: Jeffrey [name], Jeffrey [name] 09/02/2019 11:00 AM Medical Record [id number] Patient Account Number: [id number] Date of Birth/Sex: Treating RN: 1941/11/18 (78 y.o. [name] Primary Care Provider: [name] Other Clinician: Referring Provider: Treating Provider/Extender:[name], [name], [name]: 8 Unable to Obtain Patient History due to Altered Mental Status Information Obtained From Patient Respiratory Complaints and Symptoms: Negative for: Chronic or frequent coughs; Shortness of Breath Medical History: Positive for: Chronic Obstructive Pulmonary Disease (COPD) Negative for: Aspiration; Asthma; Pneumothorax; Sleep Apnea; Tuberculosis Cardiovascular Complaints and Symptoms: Negative for: Chest pain Medical History: Positive for: Congestive Heart Failure; Hypertension; Myocardial Infarction; Peripheral Venous Disease Negative for: Angina; Arrhythmia; Coronary Artery Disease; Deep Vein Thrombosis; Hypotension; Peripheral Arterial Disease; Phlebitis; Vasculitis Psychiatric Complaints and Symptoms: Negative for: Claustrophobia; Suicidal Medical History: Negative for: Anorexia/bulimia; Confinement  Anxiety Eyes Medical History: Positive for: Cataracts Negative for: Glaucoma; Optic Neuritis Ear/Nose/Mouth/Throat Medical History: Negative for: Chronic sinus problems/congestion; Middle ear problems Hematologic/Lymphatic Medical History: Negative for: Anemia; Hemophilia; Human Immunodeficiency Virus; Lymphedema; Sickle Cell Disease Gastrointestinal Medical History: Negative for: Cirrhosis ; Colitis; Crohns; Hepatitis A; Hepatitis B; Hepatitis C Endocrine Medical History: Positive for: Type II Diabetes Negative for: Type I Diabetes Time with diabetes: 55 Treated with: Insulin Blood sugar tested every day: No Genitourinary Medical History: Positive for: End Stage Renal Disease Past Medical History Notes: dialysis Immunological Medical History: Negative for: Lupus Erythematosus; Raynauds; Scleroderma Integumentary (Skin) Medical History: Negative for: History of Burn Musculoskeletal Medical History: Positive for: Osteomyelitis - left foot Negative for: Gout; Rheumatoid Arthritis; Osteoarthritis Neurologic Medical History: Negative for: Dementia; Neuropathy; Quadriplegia; Paraplegia; Seizure Disorder Oncologic Medical History: Negative for: Received Chemotherapy; Received Radiation HBO Extended History Items Eyes: Cataracts Immunizations Pneumococcal Vaccine: Received Pneumococcal Vaccination: No Implantable Devices Yes Hospitalization / Surgery  History Type of Hospitalization/Surgery 08/14/2019 [place]- faint/ decreased LOC/ chest pain Family and Social History Cancer: No; Diabetes: Yes - Father; Heart Disease: No; Hereditary Spherocytosis: No; Hypertension: No; Kidney Disease: No; Lung Disease: No; Seizures: No; Stroke: No; Thyroid Problems: No; Tuberculosis: No; Former smoker; Marital Status - Married; Alcohol Use: Rarely; Drug Use: No History; Caffeine Use: Never; Financial Concerns: No; Food, Clothing or Shelter Needs: No; Support System Lacking: No;  Transportation Concerns: No Physician Affirmation I have reviewed and agree with the above information. Electronic Signature(s) Signed: 09/03/2019 12:09:12 PM By: Baruch Gouty RN, BSN Signed: 09/06/2019 10:37:03 PM By: Worthy Keeler PA-C Entered By: Worthy Keeler on 09/02/2019 12:44:08 -------------------------------------------------------------------------------- SuperBill Details Patient Name: Date of Service: Jeffrey [name], Jeffrey [name] 09/02/2019 Medical Record [id number] Patient Account Number: [id number] Date of Birth/Sex: Treating RN: [birth year] (78 y.o. [name] Primary Care Provider: [name] Other Clinician: Referring Provider: Treating Provider/Extender:[name], [name], [name]: 8 Diagnosis Coding ICD-10 Codes Code Description I87.2 Venous insufficiency (chronic) (peripheral) I73.9 Peripheral vascular disease, unspecified L97.312 Non-pressure chronic ulcer of right ankle with fat layer exposed L97.812 Non-pressure chronic ulcer of other part of right lower leg with fat layer exposed Z89.512 Acquired absence of left leg below knee T81.31XA Disruption of external operation (surgical) wound, not elsewhere classified, initial encounter E11.622 Type 2 diabetes mellitus with other skin ulcer N18.6 End stage renal disease Z99.2 Dependence on renal dialysis Facility Procedures CPT4 Code: [id number] Description: [id number] - WOUND CARE VISIT-LEV 5 EST PT Modifier: Quantity: 1 Physician Procedures Electronic Signature(s) Signed: 09/02/2019 12:45:22 PM By: Worthy Keeler PA-C Entered By: Worthy Keeler on 09/02/2019 12:45:22

## 2019-09-03 DIAGNOSIS — R52 Pain, unspecified: Secondary | ICD-10-CM | POA: Diagnosis not present

## 2019-09-03 DIAGNOSIS — D509 Iron deficiency anemia, unspecified: Secondary | ICD-10-CM | POA: Diagnosis not present

## 2019-09-03 DIAGNOSIS — E0822 Diabetes mellitus due to underlying condition with diabetic chronic kidney disease: Secondary | ICD-10-CM | POA: Diagnosis not present

## 2019-09-03 DIAGNOSIS — N186 End stage renal disease: Secondary | ICD-10-CM | POA: Diagnosis not present

## 2019-09-03 DIAGNOSIS — E8779 Other fluid overload: Secondary | ICD-10-CM | POA: Diagnosis not present

## 2019-09-03 DIAGNOSIS — Z992 Dependence on renal dialysis: Secondary | ICD-10-CM | POA: Diagnosis not present

## 2019-09-03 DIAGNOSIS — Z87891 Personal history of nicotine dependence: Secondary | ICD-10-CM | POA: Diagnosis not present

## 2019-09-03 DIAGNOSIS — R197 Diarrhea, unspecified: Secondary | ICD-10-CM | POA: Diagnosis not present

## 2019-09-03 DIAGNOSIS — D631 Anemia in chronic kidney disease: Secondary | ICD-10-CM | POA: Diagnosis not present

## 2019-09-03 DIAGNOSIS — J449 Chronic obstructive pulmonary disease, unspecified: Secondary | ICD-10-CM | POA: Diagnosis not present

## 2019-09-03 DIAGNOSIS — I959 Hypotension, unspecified: Secondary | ICD-10-CM | POA: Diagnosis not present

## 2019-09-03 DIAGNOSIS — N2581 Secondary hyperparathyroidism of renal origin: Secondary | ICD-10-CM | POA: Diagnosis not present

## 2019-09-03 DIAGNOSIS — E8770 Fluid overload, unspecified: Secondary | ICD-10-CM | POA: Diagnosis not present

## 2019-09-04 DIAGNOSIS — I4891 Unspecified atrial fibrillation: Secondary | ICD-10-CM | POA: Diagnosis not present

## 2019-09-04 DIAGNOSIS — Z9181 History of falling: Secondary | ICD-10-CM | POA: Diagnosis not present

## 2019-09-04 DIAGNOSIS — Z950 Presence of cardiac pacemaker: Secondary | ICD-10-CM | POA: Diagnosis not present

## 2019-09-05 DIAGNOSIS — Z87891 Personal history of nicotine dependence: Secondary | ICD-10-CM | POA: Diagnosis not present

## 2019-09-05 DIAGNOSIS — E8779 Other fluid overload: Secondary | ICD-10-CM | POA: Diagnosis not present

## 2019-09-05 DIAGNOSIS — R52 Pain, unspecified: Secondary | ICD-10-CM | POA: Diagnosis not present

## 2019-09-05 DIAGNOSIS — I959 Hypotension, unspecified: Secondary | ICD-10-CM | POA: Diagnosis not present

## 2019-09-05 DIAGNOSIS — J449 Chronic obstructive pulmonary disease, unspecified: Secondary | ICD-10-CM | POA: Diagnosis not present

## 2019-09-05 DIAGNOSIS — D509 Iron deficiency anemia, unspecified: Secondary | ICD-10-CM | POA: Diagnosis not present

## 2019-09-05 DIAGNOSIS — N186 End stage renal disease: Secondary | ICD-10-CM | POA: Diagnosis not present

## 2019-09-05 DIAGNOSIS — E8770 Fluid overload, unspecified: Secondary | ICD-10-CM | POA: Diagnosis not present

## 2019-09-05 DIAGNOSIS — R197 Diarrhea, unspecified: Secondary | ICD-10-CM | POA: Diagnosis not present

## 2019-09-05 DIAGNOSIS — Z992 Dependence on renal dialysis: Secondary | ICD-10-CM | POA: Diagnosis not present

## 2019-09-05 DIAGNOSIS — D631 Anemia in chronic kidney disease: Secondary | ICD-10-CM | POA: Diagnosis not present

## 2019-09-05 DIAGNOSIS — N2581 Secondary hyperparathyroidism of renal origin: Secondary | ICD-10-CM | POA: Diagnosis not present

## 2019-09-05 DIAGNOSIS — E0822 Diabetes mellitus due to underlying condition with diabetic chronic kidney disease: Secondary | ICD-10-CM | POA: Diagnosis not present

## 2019-09-06 DIAGNOSIS — R269 Unspecified abnormalities of gait and mobility: Secondary | ICD-10-CM | POA: Diagnosis not present

## 2019-09-06 DIAGNOSIS — R062 Wheezing: Secondary | ICD-10-CM | POA: Diagnosis not present

## 2019-09-06 DIAGNOSIS — J441 Chronic obstructive pulmonary disease with (acute) exacerbation: Secondary | ICD-10-CM | POA: Diagnosis not present

## 2019-09-06 DIAGNOSIS — M6281 Muscle weakness (generalized): Secondary | ICD-10-CM | POA: Diagnosis not present

## 2019-09-07 DIAGNOSIS — D509 Iron deficiency anemia, unspecified: Secondary | ICD-10-CM | POA: Diagnosis not present

## 2019-09-07 DIAGNOSIS — R197 Diarrhea, unspecified: Secondary | ICD-10-CM | POA: Diagnosis not present

## 2019-09-07 DIAGNOSIS — Z992 Dependence on renal dialysis: Secondary | ICD-10-CM | POA: Diagnosis not present

## 2019-09-07 DIAGNOSIS — J449 Chronic obstructive pulmonary disease, unspecified: Secondary | ICD-10-CM | POA: Diagnosis not present

## 2019-09-07 DIAGNOSIS — E8770 Fluid overload, unspecified: Secondary | ICD-10-CM | POA: Diagnosis not present

## 2019-09-07 DIAGNOSIS — N2581 Secondary hyperparathyroidism of renal origin: Secondary | ICD-10-CM | POA: Diagnosis not present

## 2019-09-07 DIAGNOSIS — E0822 Diabetes mellitus due to underlying condition with diabetic chronic kidney disease: Secondary | ICD-10-CM | POA: Diagnosis not present

## 2019-09-07 DIAGNOSIS — D631 Anemia in chronic kidney disease: Secondary | ICD-10-CM | POA: Diagnosis not present

## 2019-09-07 DIAGNOSIS — N186 End stage renal disease: Secondary | ICD-10-CM | POA: Diagnosis not present

## 2019-09-07 DIAGNOSIS — E8779 Other fluid overload: Secondary | ICD-10-CM | POA: Diagnosis not present

## 2019-09-07 DIAGNOSIS — I959 Hypotension, unspecified: Secondary | ICD-10-CM | POA: Diagnosis not present

## 2019-09-07 DIAGNOSIS — R52 Pain, unspecified: Secondary | ICD-10-CM | POA: Diagnosis not present

## 2019-09-07 DIAGNOSIS — Z87891 Personal history of nicotine dependence: Secondary | ICD-10-CM | POA: Diagnosis not present

## 2019-09-08 ENCOUNTER — Telehealth: Payer: Self-pay

## 2019-09-08 DIAGNOSIS — Z87891 Personal history of nicotine dependence: Secondary | ICD-10-CM | POA: Diagnosis not present

## 2019-09-08 DIAGNOSIS — N186 End stage renal disease: Secondary | ICD-10-CM | POA: Diagnosis not present

## 2019-09-08 DIAGNOSIS — L539 Erythematous condition, unspecified: Secondary | ICD-10-CM | POA: Diagnosis not present

## 2019-09-08 DIAGNOSIS — R0989 Other specified symptoms and signs involving the circulatory and respiratory systems: Secondary | ICD-10-CM | POA: Diagnosis not present

## 2019-09-08 DIAGNOSIS — I517 Cardiomegaly: Secondary | ICD-10-CM | POA: Diagnosis not present

## 2019-09-08 DIAGNOSIS — N19 Unspecified kidney failure: Secondary | ICD-10-CM | POA: Diagnosis not present

## 2019-09-08 DIAGNOSIS — I700 Atherosclerosis of aorta: Secondary | ICD-10-CM | POA: Diagnosis not present

## 2019-09-08 DIAGNOSIS — R2243 Localized swelling, mass and lump, lower limb, bilateral: Secondary | ICD-10-CM | POA: Diagnosis not present

## 2019-09-08 DIAGNOSIS — Z992 Dependence on renal dialysis: Secondary | ICD-10-CM | POA: Diagnosis not present

## 2019-09-08 DIAGNOSIS — M7989 Other specified soft tissue disorders: Secondary | ICD-10-CM | POA: Diagnosis not present

## 2019-09-08 DIAGNOSIS — I519 Heart disease, unspecified: Secondary | ICD-10-CM | POA: Diagnosis not present

## 2019-09-08 DIAGNOSIS — R600 Localized edema: Secondary | ICD-10-CM | POA: Diagnosis not present

## 2019-09-08 NOTE — Telephone Encounter (Signed)
Phone call placed to patient's wife to confirm that Palliative order received from new PCP and to reschedule visit that is scheduled for 09/11/2019. Wife shared that patient is on the way to the hospital due to arm swelling and the inability for the dialysis center to remove fluid. Will follow up with patient's wife to check in

## 2019-09-08 NOTE — Telephone Encounter (Signed)
Spoke with Dr. Greggory Keen nurse to verify that Dr. Helene Kelp is PCP and that it is ok for Palliative Care to be involved. Per Anderson Malta, nurse for Dr. Helene Kelp, patient was seen on Friday and PCP is in agreement with Palliative Care.

## 2019-09-09 ENCOUNTER — Encounter (HOSPITAL_BASED_OUTPATIENT_CLINIC_OR_DEPARTMENT_OTHER): Payer: Medicare Other | Admitting: Physician Assistant

## 2019-09-09 ENCOUNTER — Other Ambulatory Visit: Payer: Self-pay

## 2019-09-09 DIAGNOSIS — R600 Localized edema: Secondary | ICD-10-CM | POA: Diagnosis not present

## 2019-09-09 DIAGNOSIS — L97312 Non-pressure chronic ulcer of right ankle with fat layer exposed: Secondary | ICD-10-CM | POA: Diagnosis not present

## 2019-09-09 DIAGNOSIS — E8779 Other fluid overload: Secondary | ICD-10-CM | POA: Diagnosis not present

## 2019-09-09 DIAGNOSIS — D631 Anemia in chronic kidney disease: Secondary | ICD-10-CM | POA: Diagnosis not present

## 2019-09-09 DIAGNOSIS — L97519 Non-pressure chronic ulcer of other part of right foot with unspecified severity: Secondary | ICD-10-CM | POA: Diagnosis not present

## 2019-09-09 DIAGNOSIS — M7989 Other specified soft tissue disorders: Secondary | ICD-10-CM | POA: Diagnosis not present

## 2019-09-09 DIAGNOSIS — T8789 Other complications of amputation stump: Secondary | ICD-10-CM | POA: Diagnosis not present

## 2019-09-09 DIAGNOSIS — I252 Old myocardial infarction: Secondary | ICD-10-CM | POA: Diagnosis not present

## 2019-09-09 DIAGNOSIS — Z992 Dependence on renal dialysis: Secondary | ICD-10-CM | POA: Diagnosis not present

## 2019-09-09 DIAGNOSIS — E8770 Fluid overload, unspecified: Secondary | ICD-10-CM | POA: Diagnosis not present

## 2019-09-09 DIAGNOSIS — I509 Heart failure, unspecified: Secondary | ICD-10-CM | POA: Diagnosis not present

## 2019-09-09 DIAGNOSIS — N2581 Secondary hyperparathyroidism of renal origin: Secondary | ICD-10-CM | POA: Diagnosis not present

## 2019-09-09 DIAGNOSIS — I872 Venous insufficiency (chronic) (peripheral): Secondary | ICD-10-CM | POA: Diagnosis not present

## 2019-09-09 DIAGNOSIS — I132 Hypertensive heart and chronic kidney disease with heart failure and with stage 5 chronic kidney disease, or end stage renal disease: Secondary | ICD-10-CM | POA: Diagnosis not present

## 2019-09-09 DIAGNOSIS — Z89512 Acquired absence of left leg below knee: Secondary | ICD-10-CM | POA: Diagnosis not present

## 2019-09-09 DIAGNOSIS — Z87891 Personal history of nicotine dependence: Secondary | ICD-10-CM | POA: Diagnosis not present

## 2019-09-09 DIAGNOSIS — E1151 Type 2 diabetes mellitus with diabetic peripheral angiopathy without gangrene: Secondary | ICD-10-CM | POA: Diagnosis not present

## 2019-09-09 DIAGNOSIS — R52 Pain, unspecified: Secondary | ICD-10-CM | POA: Diagnosis not present

## 2019-09-09 DIAGNOSIS — N186 End stage renal disease: Secondary | ICD-10-CM | POA: Diagnosis not present

## 2019-09-09 DIAGNOSIS — D509 Iron deficiency anemia, unspecified: Secondary | ICD-10-CM | POA: Diagnosis not present

## 2019-09-09 DIAGNOSIS — E11622 Type 2 diabetes mellitus with other skin ulcer: Secondary | ICD-10-CM | POA: Diagnosis not present

## 2019-09-09 DIAGNOSIS — L97812 Non-pressure chronic ulcer of other part of right lower leg with fat layer exposed: Secondary | ICD-10-CM | POA: Diagnosis not present

## 2019-09-09 DIAGNOSIS — E0822 Diabetes mellitus due to underlying condition with diabetic chronic kidney disease: Secondary | ICD-10-CM | POA: Diagnosis not present

## 2019-09-09 DIAGNOSIS — S31829A Unspecified open wound of left buttock, initial encounter: Secondary | ICD-10-CM | POA: Diagnosis not present

## 2019-09-09 DIAGNOSIS — I959 Hypotension, unspecified: Secondary | ICD-10-CM | POA: Diagnosis not present

## 2019-09-09 DIAGNOSIS — E1122 Type 2 diabetes mellitus with diabetic chronic kidney disease: Secondary | ICD-10-CM | POA: Diagnosis not present

## 2019-09-09 DIAGNOSIS — R197 Diarrhea, unspecified: Secondary | ICD-10-CM | POA: Diagnosis not present

## 2019-09-09 DIAGNOSIS — J449 Chronic obstructive pulmonary disease, unspecified: Secondary | ICD-10-CM | POA: Diagnosis not present

## 2019-09-09 NOTE — Progress Notes (Signed)
GISCARD, NOUN ([id number]) Visit Report for 09/09/2019 Chief Complaint Document Details Patient Name: Date of Service: Jeffrey [name], Jeffrey [name] 09/09/2019 2:00 PM Medical Record [id number] Patient Account Number: [id number] Date of Birth/Sex: Treating RN: [birth year] (78 y.o. Jeffrey [name] Primary Care Provider: [name] Other Clinician: Referring Provider: Treating Provider/Extender:[name], [name], [name]: 9 Information Obtained from: Patient Chief Complaint Bilateral LE Ulcers Electronic Signature(s) Signed: 09/09/2019 6:05:26 PM By: Worthy Keeler PA-C Entered By: Worthy Keeler on 09/09/2019 15:02:24 -------------------------------------------------------------------------------- HPI Details Patient Name: Date of Service: Jeffrey [name] 09/09/2019 2:00 PM Medical Record [id number] Patient Account Number: [id number] Date of Birth/Sex: Treating RN: 26-Nov-1941 (78 y.o. Jeffrey [name] Primary Care Provider: [name] Other Clinician: Referring Provider: Treating Provider/Extender:[name], [name], [name]: 9 History of Present Illness HPI Description: 07/08/2019 on evaluation today patient presents for initial evaluation in clinic concerning issues that began about 3 weeks ago he states. He recently underwent a left BKA to the under the care of Dr. Doren Custard and this was on Apr 14, 2019. Subsequently he developed an area of what appears to be abrasion just below this amputation site which they feel occurred as a result of utilization of the shrinker. With that being said the good news is this does not really appear to be doing too poorly at all. The bigger issue is that he has multiple ulcers of the right lower extremity where 2 of the areas appear to be more blisters and in fact 1 of them we did not even count as it does not appear to be an open wound at all. The other site is on the lateral  malleolus and I believe this may be not just venous related but potentially some pressure component to it as well. Nonetheless it does sound like he sleeps in his chair with his leg up on a stool and subsequently this causes some pressure to the lateral portion of his ankle as it does press on the stool his wife tells me. He is not able to lay flat such as in the bed or even reclined back in his recliner due to issues with his breathing. 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 He also has congestive heart failure. Currently they have been using meta honey on the wounds until they ran out prior to coming here. His most recent hemoglobin A1c at dialysis was 7.2. 07/15/2019 upon evaluation today patient appears to be doing overall quite poorly well with regard to his left distal stump ulcer. This has healed they have not been using the shrinker and fortunately this healed quite nicely. She did not try the foam underneath the shrinker she was afraid that it would cause too much moisture. This is his daughter. Subsequently in regard to the right lower extremity patient actually has been having to change this twice a day most days. Unfortunately he has a lot of drainage and the dressings that are putting on just do not seem to be lasting. Nonetheless I feel like we may need to initiate some kind of compression although we have to be very cautious with this based on his arterial study which shows that he does have a TBI of 0.43 this is not in the best range obviously. 07/22/2019 on evaluation today  patient appears to be doing decently well with regard to his wounds. I do not see any evidence of active infection at this time which is good news. Fortunately there is no signs of worsening and  in fact his wounds actually appear to be doing better which is great news. No fevers, chills, nausea, vomiting, or diarrhea. 07/29/19 on evaluation today patient actually appears to be doing quite well with regard to his wounds currently on the right lateral leg and right medial leg. With that being said the right lateral malleolus did have some Slough noted upon evaluation today this is gonna need to be addressed by way of sharp debridement. With that being said I feel like that we can appropriately debride this without complication which is good news. He has some discomfort but nothing too significant. 08/05/2019 on evaluation today patient actually appears to be doing quite well with regard to his left medial lower extremity ulcer and his lateral lower extremity ulcer is completely healed. With that being said he is having some issues still with the right lateral ankle I think this is simply due to the fact that he is constantly putting pressure on this when he sleeping at night or even when he is in the dialysis chair for that reason during the day. Nonetheless I believe that he would benefit from having more appropriate offloading possibly even a Prevalon boot could be of benefit to him. 08/12/2019 on evaluation today patient appears to be doing better in regard to his right lower extremity ulcers in general which is good news. Unfortunately in regard to his left lower extremity he appears to be doing more poorly today at this time. In fact the area that we were concerned about we were trying to protect last week is opened up into a true open wound from the blister that is shown up. He also has a second blister on the distal aspect of his stump which is open at this point in time just draining a little bit but not completely cleared away as far as the blistered skin is concerned. Subsequently we will get to keep an eye on this that may need to be removed or come off itself by next week we shall  see. 08/26/2019 on evaluation today patient actually was in the hospital for a period of time from 08/14/2019 through 08/20/2019. Unfortunately he had several episodes of shortness of breath with intermittent chest pain and altered mental status with decreased level of consciousness and EMS was called. He was evaluated in the hospital where throughout the hospital course he had multiple issues including some trouble with NSVT but no persistent SVT. Cardiology consider cardioversion at one point but then did not. Apparently wound care did see him for his ulcers on his lower extremities although the patient states he really was not impressed with what they were doing. He does have follow-up appointments with cardiology upon discharge as well as continuing with dialysis and seen his primary care provider in 1 to 2 weeks due to his T3 and T4 levels since he did have an elevated TSH in the hospital. His wounds today appear to be doing fairly well and in fact the left stump wounds are dramatically improved compared to when I last saw him. On the right the original wounds seem to be doing slightly better although he does have a new wound on his toe unfortunately. This is the right second toe. 09/02/2019 on evaluation today patient appears to be doing roughly about  the same in regard to his wounds in general. He has not really made significant improvements but all in all things appear to be fairly stable which is good news. Unfortunately he was in dialysis when his blood pressure and oxygen saturation dropped recently he ended up going to the hospital. He was seen in the emergency department where on 08/29/2019 he was told by the physician who was caring for him and his wife was told as well that they should call hospice in as he was not likely to live much longer. Nonetheless that is something that his wife and daughter state they do not want to do obviously under hospice he tells me the biggest issue is he is  not allowed to have dialysis which again would lead to his imminent demise. Nonetheless at this point he wants to try it as much as possible enjoy the remaining time that he has with his family which I can respect. In the meantime were trying to manage his wounds as much as we can and as well as we can though again I am not sure how likely he will be to heal everything. 09/09/2019 on evaluation today patient appears to be doing better in my opinion in regard to pre-much all of his wounds on the lower extremity. There does not appear to be any signs of active infection at this time which is good news. No fevers, chills, nausea, vomiting, or diarrhea. Overall I think that things are doing about as well as we can expect at this point. He did have a lot of fluid pulled off today and his right arm is extremely swollen that is uncomfortable for him he is most concerned about that at this time. Electronic Signature(s) Signed: 09/09/2019 6:05:26 PM By: Worthy Keeler PA-C Entered By: Worthy Keeler on 09/09/2019 15:15:31 -------------------------------------------------------------------------------- Physical Exam Details Patient Name: Date of Service: Jeffrey [name], Jeffrey [name] 09/09/2019 2:00 PM Medical Record [id number] Patient Account Number: [id number] Date of Birth/Sex: Treating RN: 1941/01/26 (78 y.o. Jeffrey [name] Primary Care Provider: [name] Other Clinician: Referring Provider: Treating Provider/Extender:[name], [name], [name]: 9 Constitutional Well-nourished and well-hydrated in no acute distress. Respiratory normal breathing without difficulty. clear to auscultation bilaterally. Cardiovascular regular rate and rhythm with normal S1, S2. Psychiatric this patient is able to make decisions and demonstrates good insight into disease process. Alert and Oriented x 3. pleasant and cooperative. Notes Upon evaluation today patient's wounds  actually showed signs of fairly good granulation at most wound locations which is great news. He still is having issues with wound opening areas and so on with regard to his lower extremities but again this is not nearly as bad as it was in the past. Overall I am extremely pleased all things considering. I think we are trying to maintain and if at all possible improve his wounds although again he has a lot of other medical issues going on at this time. Electronic Signature(s) Signed: 09/09/2019 6:05:26 PM By: Worthy Keeler PA-C Entered By: Worthy Keeler on 09/09/2019 15:16:13 -------------------------------------------------------------------------------- Physician Orders Details Patient Name: Date of Service: Jeffrey [name], Jeffrey [name] 09/09/2019 2:00 PM Medical Record [id number] Patient Account Number: [id number] Date of Birth/Sex: Treating RN: [birth year] (78 y.o. Jeffrey [name] Primary Care Provider: [name] Other Clinician: Referring Provider: Treating Provider/Extender:[name], [name], [name]: 9 Verbal / Phone Orders: No Diagnosis Coding ICD-10 Coding Code Description I87.2 Venous insufficiency (chronic) (peripheral) I73.9 Peripheral vascular disease, unspecified L97.312  Non-pressure chronic ulcer of right ankle with fat layer exposed L97.812 Non-pressure chronic ulcer of other part of right lower leg with fat layer exposed Z89.512 Acquired absence of left leg below knee T81.31XA Disruption of external operation (surgical) wound, not elsewhere classified, initial encounter E11.622 Type 2 diabetes mellitus with other skin ulcer N18.6 End stage renal disease Z99.2 Dependence on renal dialysis Follow-up Appointments Return Appointment in 1 week. Dressing Change Frequency Wound #1 Right,Lateral Malleolus Do not change entire dressing for one week. Wound #4 Right,Medial Lower Leg Do not change entire dressing for one week. Wound #5  Amputation Site - Below Knee Change Dressing every other day. Wound #6 Right,Lateral Lower Leg Do not change entire dressing for one week. Wound #7 Right Toe Second Change Dressing every other day. Skin Barriers/Peri-Wound Care Barrier cream - mix with TCA cream to lower part of right lower leg TCA Cream or Ointment Wound Cleansing Wound #1 Right,Lateral Malleolus May shower with protection. Wound #5 Amputation Site - Below Knee Clean wound with Wound Cleanser Wound #7 Right Toe Second Clean wound with Wound Cleanser Primary Wound Dressing Wound #1 Right,Lateral Malleolus Calcium Alginate with Silver Wound #4 Right,Medial Lower Leg Calcium Alginate with Silver Wound #5 Amputation Site - Below Knee Calcium Alginate with Silver Wound #6 Right,Lateral Lower Leg Calcium Alginate with Silver Wound #7 Right Toe Second Calcium Alginate with Silver Secondary Dressing Wound #1 Right,Lateral Malleolus Dry Gauze Wound #4 Right,Medial Lower Leg Dry Gauze Kerramax Wound #5 Amputation Site - Below Knee ABD pad Wound #6 Right,Lateral Lower Leg Dry Gauze ABD pad Wound #7 Right Toe Second Kerlix/Rolled Gauze Dry Gauze Edema Control Kerlix and Coban - Right Lower Extremity - do not wrap too tight Elevate legs to the level of the heart or above for 30 minutes daily and/or when sitting, a frequency of: - throughout the day Other: - stump shrinker or ace wrap to left stump to hold dressing in place Off-Loading Other: - do not let ankle roll out to the side, no pressure on right lateral ankle, use piece of thick foam with hole cut out for ankle especially at night Electronic Signature(s) Signed: 09/09/2019 5:58:48 PM By: Deon Pilling Signed: 09/09/2019 6:05:26 PM By: Worthy Keeler PA-C Entered By: Deon Pilling on 09/09/2019 15:05:22 -------------------------------------------------------------------------------- Problem List Details Patient Name: Date of Service: Jeffrey [name] 09/09/2019 2:00 PM Medical Record [id number] Patient Account Number: [id number] Date of Birth/Sex: Treating RN: 03-29-1941 (78 y.o. Jeffrey [name] Primary Care Provider: [name] Other Clinician: Referring Provider: Treating Provider/Extender:[name], [name], [name]: 9 Active Problems ICD-10 Evaluated Encounter Code Description Active Date Today Diagnosis I87.2 Venous insufficiency (chronic) (peripheral) 07/08/2019 No Yes I73.9 Peripheral vascular disease, unspecified 07/08/2019 No Yes L97.312 Non-pressure chronic ulcer of right ankle with fat layer 07/08/2019 No Yes exposed L97.812 Non-pressure chronic ulcer of other part of right lower 07/08/2019 No Yes leg with fat layer exposed Z89.512 Acquired absence of left leg below knee 07/08/2019 No Yes T81.31XA Disruption of external operation (surgical) wound, not 07/08/2019 No Yes elsewhere classified, initial encounter E11.622 Type 2 diabetes mellitus with other skin ulcer 07/08/2019 No Yes N18.6 End stage renal disease 07/08/2019 No Yes Z99.2 Dependence on renal dialysis 07/08/2019 No Yes Inactive Problems Resolved Problems Electronic Signature(s) Signed: 09/09/2019 6:05:26 PM By: Worthy Keeler PA-C Entered By: Worthy Keeler on 09/09/2019 15:02:19 -------------------------------------------------------------------------------- Progress Note Details Patient Name: Date of Service: Jeffrey [name] 09/09/2019 2:00 PM Medical Record [id number] Patient Account  Number: [id number] Date of Birth/Sex: Treating RN: 1941-07-30 (78 y.o. Jeffrey [name] Primary Care Provider: [name] Other Clinician: Referring Provider: Treating Provider/Extender:[name], [name], [name]: 9 Subjective Chief Complaint Information obtained from Patient Bilateral LE Ulcers History of Present Illness (HPI) 07/08/2019 on evaluation today patient presents for initial  evaluation in clinic concerning issues that began about 3 weeks ago he states. He recently underwent a left BKA to the under the care of Dr. Doren Custard and this was on Apr 14, 2019. Subsequently he developed an area of what appears to be abrasion just below this amputation site which they feel occurred as a result of utilization of the shrinker. With that being said the good news is this does not really appear to be doing too poorly at all. The bigger issue is that he has multiple ulcers of the right lower extremity where 2 of the areas appear to be more blisters and in fact 1 of them we did not even count as it does not appear to be an open wound at all. The other site is on the lateral malleolus and I believe this may be not just venous related but potentially some pressure component to it as well. Nonetheless it does sound like he sleeps in his chair with his leg up on a stool and subsequently this causes some pressure to the lateral portion of his ankle as it does press on the stool his wife tells me. He is not able to lay flat such as in the bed or even reclined back in his recliner due to issues with his breathing. 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 He also has congestive heart failure. Currently they have been using meta honey on the wounds until they ran out prior to coming here. His most recent hemoglobin A1c at dialysis was 7.2. 07/15/2019 upon evaluation today patient appears to be doing overall quite poorly well with regard to his left distal stump ulcer. This has healed they have not been using the shrinker and fortunately this healed quite nicely. She did not try the foam underneath the shrinker she was afraid that it would cause too much moisture. This is his  daughter. Subsequently in regard to the right lower extremity patient actually has been having to change this twice a day most days. Unfortunately he has a lot of drainage and the dressings that are putting on just do not seem to be lasting. Nonetheless I feel like we may need to initiate some kind of compression although we have to be very cautious with this based on his arterial study which shows that he does have a TBI of 0.43 this is not in the best range obviously. 07/22/2019 on evaluation today patient appears to be doing decently well with regard to his wounds. I do not see any evidence of active infection at this time which is good news. Fortunately there is no signs of worsening and in fact his wounds actually appear to be doing better which is great news. No fevers, chills, nausea, vomiting, or diarrhea. 07/29/19 on evaluation today patient actually appears to be doing quite well with regard to his wounds currently on the right lateral leg and right medial leg. With that being said the right lateral malleolus did have some Slough noted  upon evaluation today this is gonna need to be addressed by way of sharp debridement. With that being said I feel like that we can appropriately debride this without complication which is good news. He has some discomfort but nothing too significant. 08/05/2019 on evaluation today patient actually appears to be doing quite well with regard to his left medial lower extremity ulcer and his lateral lower extremity ulcer is completely healed. With that being said he is having some issues still with the right lateral ankle I think this is simply due to the fact that he is constantly putting pressure on this when he sleeping at night or even when he is in the dialysis chair for that reason during the day. Nonetheless I believe that he would benefit from having more appropriate offloading possibly even a Prevalon boot could be of benefit to him. 08/12/2019 on  evaluation today patient appears to be doing better in regard to his right lower extremity ulcers in general which is good news. Unfortunately in regard to his left lower extremity he appears to be doing more poorly today at this time. In fact the area that we were concerned about we were trying to protect last week is opened up into a true open wound from the blister that is shown up. He also has a second blister on the distal aspect of his stump which is open at this point in time just draining a little bit but not completely cleared away as far as the blistered skin is concerned. Subsequently we will get to keep an eye on this that may need to be removed or come off itself by next week we shall see. 08/26/2019 on evaluation today patient actually was in the hospital for a period of time from 08/14/2019 through 08/20/2019. Unfortunately he had several episodes of shortness of breath with intermittent chest pain and altered mental status with decreased level of consciousness and EMS was called. He was evaluated in the hospital where throughout the hospital course he had multiple issues including some trouble with NSVT but no persistent SVT. Cardiology consider cardioversion at one point but then did not. Apparently wound care did see him for his ulcers on his lower extremities although the patient states he really was not impressed with what they were doing. He does have follow-up appointments with cardiology upon discharge as well as continuing with dialysis and seen his primary care provider in 1 to 2 weeks due to his T3 and T4 levels since he did have an elevated TSH in the hospital. His wounds today appear to be doing fairly well and in fact the left stump wounds are dramatically improved compared to when I last saw him. On the right the original wounds seem to be doing slightly better although he does have a new wound on his toe unfortunately. This is the right second toe. 09/02/2019 on  evaluation today patient appears to be doing roughly about the same in regard to his wounds in general. He has not really made significant improvements but all in all things appear to be fairly stable which is good news. Unfortunately he was in dialysis when his blood pressure and oxygen saturation dropped recently he ended up going to the hospital. He was seen in the emergency department where on 08/29/2019 he was told by the physician who was caring for him and his wife was told as well that they should call hospice in as he was not likely to live much longer. Nonetheless that is  something that his wife and daughter state they do not want to do obviously under hospice he tells me the biggest issue is he is not allowed to have dialysis which again would lead to his imminent demise. Nonetheless at this point he wants to try it as much as possible enjoy the remaining time that he has with his family which I can respect. In the meantime were trying to manage his wounds as much as we can and as well as we can though again I am not sure how likely he will be to heal everything. 09/09/2019 on evaluation today patient appears to be doing better in my opinion in regard to pre-much all of his wounds on the lower extremity. There does not appear to be any signs of active infection at this time which is good news. No fevers, chills, nausea, vomiting, or diarrhea. Overall I think that things are doing about as well as we can expect at this point. He did have a lot of fluid pulled off today and his right arm is extremely swollen that is uncomfortable for him he is most concerned about that at this time. Patient History Unable to Obtain Patient History due to Altered Mental Status. Information obtained from Patient. Family History Diabetes - Father, No family history of Cancer, Heart Disease, Hereditary Spherocytosis, Hypertension, Kidney Disease, Lung Disease, Seizures, Stroke, Thyroid Problems,  Tuberculosis. Social History Former smoker, Marital Status - Married, Alcohol Use - Rarely, Drug Use - No History, Caffeine Use - Never. Medical History Eyes Patient has history of Cataracts Denies history of Glaucoma, Optic Neuritis Ear/Nose/Mouth/Throat Denies history of Chronic sinus problems/congestion, Middle ear problems Hematologic/Lymphatic Denies history of Anemia, Hemophilia, Human Immunodeficiency Virus, Lymphedema, Sickle Cell Disease Respiratory Patient has history of Chronic Obstructive Pulmonary Disease (COPD) Denies history of Aspiration, Asthma, Pneumothorax, Sleep Apnea, Tuberculosis Cardiovascular Patient has history of Congestive Heart Failure, Hypertension, Myocardial Infarction, Peripheral Venous Disease Denies history of Angina, Arrhythmia, Coronary Artery Disease, Deep Vein Thrombosis, Hypotension, Peripheral Arterial Disease, Phlebitis, Vasculitis Gastrointestinal Denies history of Cirrhosis , Colitis, Crohnoos, Hepatitis A, Hepatitis B, Hepatitis C Endocrine Patient has history of Type II Diabetes Denies history of Type I Diabetes Genitourinary Patient has history of End Stage Renal Disease Immunological Denies history of Lupus Erythematosus, Raynaudoos, Scleroderma Integumentary (Skin) Denies history of History of Burn Musculoskeletal Patient has history of Osteomyelitis - left foot Denies history of Gout, Rheumatoid Arthritis, Osteoarthritis Neurologic Denies history of Dementia, Neuropathy, Quadriplegia, Paraplegia, Seizure Disorder Oncologic Denies history of Received Chemotherapy, Received Radiation Psychiatric Denies history of Anorexia/bulimia, Confinement Anxiety Hospitalization/Surgery History - 08/14/2019 [place]- faint/ decreased LOC/ chest pain. Medical And Surgical History Notes Genitourinary dialysis Review of Systems (ROS) Constitutional Symptoms (General Health) Denies complaints or symptoms of Fatigue, Fever, Chills, Marked  Weight Change. Respiratory Denies complaints or symptoms of Chronic or frequent coughs, Shortness of Breath. Cardiovascular Denies complaints or symptoms of Chest pain. Psychiatric Denies complaints or symptoms of Claustrophobia, Suicidal. Objective Constitutional Well-nourished and well-hydrated in no acute distress. Vitals Time Taken: 2:33 PM, Height: 71 in, Weight: 209 lbs, BMI: 29.1, Temperature: 97.5 F, Pulse: 76 bpm, Respiratory Rate: 18 breaths/min, Blood Pressure: 105/70 mmHg. Respiratory normal breathing without difficulty. clear to auscultation bilaterally. Cardiovascular regular rate and rhythm with normal S1, S2. Psychiatric this patient is able to make decisions and demonstrates good insight into disease process. Alert and Oriented x 3. pleasant and cooperative. General Notes: Upon evaluation today patient's wounds actually showed signs of fairly good granulation at most  wound locations which is great news. He still is having issues with wound opening areas and so on with regard to his lower extremities but again this is not nearly as bad as it was in the past. Overall I am extremely pleased all things considering. I think we are trying to maintain and if at all possible improve his wounds although again he has a lot of other medical issues going on at this time. Integumentary (Hair, Skin) Wound #1 status is Open. Original cause of wound was Gradually Appeared. The wound is located on the Right,Lateral Malleolus. The wound measures 1.2cm length x 1cm width x 0.3cm depth; 0.942cm^2 area and 0.283cm^3 volume. There is Fat Layer (Subcutaneous Tissue) Exposed exposed. There is no tunneling or undermining noted. There is a small amount of serosanguineous drainage noted. The wound margin is distinct with the outline attached to the wound base. There is medium (34-66%) pink granulation within the wound bed. There is a medium (34-66%) amount of necrotic tissue within the wound  bed including Adherent Slough. Wound #4 status is Open. Original cause of wound was Blister. The wound is located on the Right,Medial Lower Leg. The wound measures 3cm length x 3cm width x 0.1cm depth; 7.069cm^2 area and 0.707cm^3 volume. There is Fat Layer (Subcutaneous Tissue) Exposed exposed. There is no tunneling or undermining noted. There is a medium amount of serosanguineous drainage noted. The wound margin is distinct with the outline attached to the wound base. There is large (67-100%) red granulation within the wound bed. There is a small (1-33%) amount of necrotic tissue within the wound bed including Adherent Slough. Wound #5 status is Open. Original cause of wound was Gradually Appeared. The wound is located on the Amputation Site - Below Knee. The wound measures 0.5cm length x 2.6cm width x 0.1cm depth; 1.021cm^2 area and 0.102cm^3 volume. There is Fat Layer (Subcutaneous Tissue) Exposed exposed. There is no tunneling or undermining noted. There is a small amount of serosanguineous drainage noted. The wound margin is distinct with the outline attached to the wound base. There is large (67-100%) granulation within the wound bed. There is no necrotic tissue within the wound bed. Wound #6 status is Open. Original cause of wound was Gradually Appeared. The wound is located on the Right,Lateral Lower Leg. The wound measures 11.5cm length x 5cm width x 0.1cm depth; 45.16cm^2 area and 4.516cm^3 volume. There is Fat Layer (Subcutaneous Tissue) Exposed exposed. There is no tunneling or undermining noted. There is a medium amount of serosanguineous drainage noted. The wound margin is distinct with the outline attached to the wound base. There is large (67-100%) red granulation within the wound bed. There is a small (1-33%) amount of necrotic tissue within the wound bed including Adherent Slough. Wound #7 status is Open. Original cause of wound was Gradually Appeared. The wound is located on  the Right Toe Second. The wound measures 1.1cm length x 1.2cm width x 0.1cm depth; 1.037cm^2 area and 0.104cm^3 volume. There is no tunneling or undermining noted. There is a small amount of serosanguineous drainage noted. The wound margin is distinct with the outline attached to the wound base. There is no granulation within the wound bed. There is a large (67-100%) amount of necrotic tissue within the wound bed including Eschar and Adherent Slough. 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 Dressing Change Frequency: Wound #1 Right,Lateral Malleolus: Do not change entire dressing for one week. Wound #4 Right,Medial Lower Leg: Do not change entire dressing for one week. Wound #5 Amputation Site - Below Knee: Change Dressing every other day. Wound #6 Right,Lateral Lower Leg: Do not change entire dressing for one week. Wound #7 Right Toe Second: Change Dressing every other day. Skin Barriers/Peri-Wound Care: Barrier cream - mix with TCA cream to lower part of right lower leg TCA Cream or Ointment Wound Cleansing: Wound #1 Right,Lateral Malleolus: May shower with protection. Wound #5 Amputation Site - Below Knee: Clean wound with Wound Cleanser Wound #7 Right Toe Second: Clean wound with Wound Cleanser Primary Wound Dressing: Wound #1 Right,Lateral Malleolus: Calcium Alginate with Silver Wound #4 Right,Medial Lower Leg: Calcium Alginate with Silver Wound #5  Amputation Site - Below Knee: Calcium Alginate with Silver Wound #6 Right,Lateral Lower Leg: Calcium Alginate with Silver Wound #7 Right Toe Second: Calcium Alginate with Silver Secondary Dressing: Wound #1 Right,Lateral Malleolus: Dry Gauze Wound #4 Right,Medial Lower Leg: Dry Gauze [place] Wound #5 Amputation Site - Below Knee: ABD pad Wound #6 Right,Lateral Lower Leg: Dry Gauze ABD pad Wound #7 Right Toe Second: Kerlix/Rolled Gauze Dry Gauze Edema Control: Kerlix and Coban - Right Lower Extremity - do not wrap too tight Elevate legs to the level of the heart or above for 30 minutes daily and/or when sitting, a frequency of: - throughout the day Other: - stump shrinker or ace wrap to left stump to hold dressing in place Off-Loading: Other: - do not let ankle roll out to the side, no pressure on right lateral ankle, use piece of thick foam with hole cut out for ankle especially at night 1 my suggestion currently is going to be that we continue with the current wound care measures which includes the silver alginate dressing which we are using to all wound locations at this time. That seems to be doing well and I am pleased in this regard. 2. We will continue with the very light Kerlix and Covan wrap to the right lower extremity that seems to be helping he is using his stump shrinker on the right which also seems to be helping at this time. 3. As much as he can I do think the patient needs to elevate his legs to help with edema control. We will see patient back for reevaluation in 1 week here in the clinic. If anything worsens or changes patient will contact our office for additional recommendations. Electronic Signature(s) Signed: 09/09/2019 6:05:26 PM By: Worthy Keeler PA-C Entered By: Worthy Keeler on 09/09/2019 15:16:58 -------------------------------------------------------------------------------- HxROS Details Patient Name: Date of Service: Jeffrey [name]  09/09/2019 2:00 PM Medical Record [id number] Patient Account Number: [id number] Date of Birth/Sex: Treating RN: February 17, 1941 (78 y.o. Jeffrey [name] Primary Care Provider: [name] Other Clinician: Referring Provider: Treating Provider/Extender:[name], [name], [name]: 9 Unable to Obtain Patient History due to Altered Mental Status Information Obtained From Patient Constitutional Symptoms (General Health) Complaints and Symptoms: Negative for: Fatigue; Fever; Chills; Marked Weight Change Respiratory Complaints and Symptoms: Negative for: Chronic or frequent coughs; Shortness of Breath Medical History: Positive for: Chronic Obstructive Pulmonary Disease (COPD) Negative for: Aspiration; Asthma; Pneumothorax;  Sleep Apnea; Tuberculosis Cardiovascular Complaints and Symptoms: Negative for: Chest pain Medical History: Positive for: Congestive Heart Failure; Hypertension; Myocardial Infarction; Peripheral Venous Disease Negative for: Angina; Arrhythmia; Coronary Artery Disease; Deep Vein Thrombosis; Hypotension; Peripheral Arterial Disease; Phlebitis; Vasculitis Psychiatric Complaints and Symptoms: Negative for: Claustrophobia; Suicidal Medical History: Negative for: Anorexia/bulimia; Confinement Anxiety Eyes Medical History: Positive for: Cataracts Negative for: Glaucoma; Optic Neuritis Ear/Nose/Mouth/Throat Medical History: Negative for: Chronic sinus problems/congestion; Middle ear problems Hematologic/Lymphatic Medical History: Negative for: Anemia; Hemophilia; Human Immunodeficiency Virus; Lymphedema; Sickle Cell Disease Gastrointestinal Medical History: Negative for: Cirrhosis ; Colitis; Crohns; Hepatitis A; Hepatitis B; Hepatitis C Endocrine Medical History: Positive for: Type II Diabetes Negative for: Type I Diabetes Time with diabetes: 51 Treated with: Insulin Blood sugar tested every day: No Genitourinary Medical  History: Positive for: End Stage Renal Disease Past Medical History Notes: dialysis Immunological Medical History: Negative for: Lupus Erythematosus; Raynauds; Scleroderma Integumentary (Skin) Medical History: Negative for: History of Burn Musculoskeletal Medical History: Positive for: Osteomyelitis - left foot Negative for: Gout; Rheumatoid Arthritis; Osteoarthritis Neurologic Medical History: Negative for: Dementia; Neuropathy; Quadriplegia; Paraplegia; Seizure Disorder Oncologic Medical History: Negative for: Received Chemotherapy; Received Radiation HBO Extended History Items Eyes: Cataracts Immunizations Pneumococcal Vaccine: Received Pneumococcal Vaccination: No Implantable Devices Yes Hospitalization / Surgery History Type of Hospitalization/Surgery 08/14/2019 [place]- faint/ decreased LOC/ chest pain Family and Social History Cancer: No; Diabetes: Yes - Father; Heart Disease: No; Hereditary Spherocytosis: No; Hypertension: No; Kidney Disease: No; Lung Disease: No; Seizures: No; Stroke: No; Thyroid Problems: No; Tuberculosis: No; Former smoker; Marital Status - Married; Alcohol Use: Rarely; Drug Use: No History; Caffeine Use: Never; Financial Concerns: No; Food, Clothing or Shelter Needs: No; Support System Lacking: No; Transportation Concerns: No Physician Affirmation I have reviewed and agree with the above information. Electronic Signature(s) Signed: 09/09/2019 5:58:48 PM By: Deon Pilling Signed: 09/09/2019 6:05:26 PM By: Worthy Keeler PA-C Entered By: Worthy Keeler on 09/09/2019 15:16:01 -------------------------------------------------------------------------------- SuperBill Details Patient Name: Date of Service: Jeffrey [name], Jeffrey [name] 09/09/2019 Medical Record [id number] Patient Account Number: [id number] Date of Birth/Sex: Treating RN: 1941/03/30 (78 y.o. Jeffrey [name] Primary Care Provider: [name] Other Clinician: Referring  Provider: Treating Provider/Extender:[name], [name], [name]: 9 Diagnosis Coding ICD-10 Codes Code Description I87.2 Venous insufficiency (chronic) (peripheral) I73.9 Peripheral vascular disease, unspecified L97.312 Non-pressure chronic ulcer of right ankle with fat layer exposed L97.812 Non-pressure chronic ulcer of other part of right lower leg with fat layer exposed Z89.512 Acquired absence of left leg below knee T81.31XA Disruption of external operation (surgical) wound, not elsewhere classified, initial encounter E11.622 Type 2 diabetes mellitus with other skin ulcer N18.6 End stage renal disease Z99.2 Dependence on renal dialysis Facility Procedures The patient participates with Medicare or their insurance follows the Medicare Facility Guidelines: CPT4 Code Description Modifier Quantity [id number] [phone] - WOUND CARE VISIT-LEV 5 EST PT 1 Physician Procedures CPT4 Code Description: [id number] - WC PHYS LEVEL 4 - EST PT ICD-10 Diagnosis Description I87.2 Venous insufficiency (chronic) (peripheral) I73.9 Peripheral vascular disease, unspecified L97.312 Non-pressure chronic ulcer of right ankle with fat laye  L97.812 Non-pressure chronic ulcer of other part of right lower Modifier: r exposed leg with fat la Quantity: 1 yer exposed Electronic Signature(s) Signed: 09/09/2019 6:05:26 PM By: Worthy Keeler PA-C Entered By: Worthy Keeler on 09/09/2019 15:17:09

## 2019-09-10 DIAGNOSIS — E0822 Diabetes mellitus due to underlying condition with diabetic chronic kidney disease: Secondary | ICD-10-CM | POA: Diagnosis not present

## 2019-09-10 DIAGNOSIS — I959 Hypotension, unspecified: Secondary | ICD-10-CM | POA: Diagnosis not present

## 2019-09-10 DIAGNOSIS — R52 Pain, unspecified: Secondary | ICD-10-CM | POA: Diagnosis not present

## 2019-09-10 DIAGNOSIS — J449 Chronic obstructive pulmonary disease, unspecified: Secondary | ICD-10-CM | POA: Diagnosis not present

## 2019-09-10 DIAGNOSIS — D631 Anemia in chronic kidney disease: Secondary | ICD-10-CM | POA: Diagnosis not present

## 2019-09-10 DIAGNOSIS — R197 Diarrhea, unspecified: Secondary | ICD-10-CM | POA: Diagnosis not present

## 2019-09-10 DIAGNOSIS — Z992 Dependence on renal dialysis: Secondary | ICD-10-CM | POA: Diagnosis not present

## 2019-09-10 DIAGNOSIS — D509 Iron deficiency anemia, unspecified: Secondary | ICD-10-CM | POA: Diagnosis not present

## 2019-09-10 DIAGNOSIS — N186 End stage renal disease: Secondary | ICD-10-CM | POA: Diagnosis not present

## 2019-09-10 DIAGNOSIS — E8779 Other fluid overload: Secondary | ICD-10-CM | POA: Diagnosis not present

## 2019-09-10 DIAGNOSIS — E8770 Fluid overload, unspecified: Secondary | ICD-10-CM | POA: Diagnosis not present

## 2019-09-10 DIAGNOSIS — Z87891 Personal history of nicotine dependence: Secondary | ICD-10-CM | POA: Diagnosis not present

## 2019-09-10 DIAGNOSIS — N2581 Secondary hyperparathyroidism of renal origin: Secondary | ICD-10-CM | POA: Diagnosis not present

## 2019-09-11 ENCOUNTER — Encounter: Payer: Medicare Other | Admitting: Nurse Practitioner

## 2019-09-11 ENCOUNTER — Other Ambulatory Visit: Payer: Medicare Other | Admitting: Hospice

## 2019-09-11 ENCOUNTER — Other Ambulatory Visit: Payer: Self-pay

## 2019-09-11 DIAGNOSIS — Z515 Encounter for palliative care: Secondary | ICD-10-CM | POA: Diagnosis not present

## 2019-09-11 NOTE — Progress Notes (Signed)
[facility] Consult Note Telephone: [phone]  Fax: [phone]  PATIENT NAME: Jeffrey [name] DOB: 10-May-1941 MRN: [id number]  PRIMARY CARE PROVIDER:   Scot Jun, FNP  REFERRING PROVIDER: [facility] RESPONSIBLE PARTY:   Flemming [place] [phone]  TELEHEALTH VISIT STATEMENT Due to the COVID-19 crisis, this visit was done via telephone from my office. It was initiated and consented to by this patient and/or family.   RECOMMENDATIONS/PLAN:  1. Advance Care Planning/Goals of Care:  Visit consisted of building trust and discussions on Palliative Medicine as specialized medical care for people living with serious illness, aimed at facilitating advance care plan, symptoms relief and establishing goals of care. Patient is a full code. [name] said she has taken care of patient for years and is not about to give up on him. Therapeutic listening and validation provided. Goals include to maximize quality of life and symptom management. 2. Symptom management: End stage renal disease. Patient does not urinate at home. Ongoing dialysis Mon Tue Thur and Sat. Spouse reported Patient with swelling, edema to right arm. He was seen in the ED 09/08/2019 and was given Doxycycline for it. Education provided to take it for the 10 complete days even if the arm feels better/improves. Elevation of extremity also encouraged.  3. Follow up Palliative Care Visit: Palliative care will continue to follow for goals of care clarification and symptom management.   I spent 40 minutes providing this consultation, from 10.00am to 10.40am. More than 50% of the time in this consultation was spent on coordinating advance care communication.   HISTORY OF PRESENT ILLNESS:  Jeffrey [name] is a 78 y.o. year old male with multiple medical problems including End stage renal disease, CHF CAD HTN left foot amputation, Afib Type 2 DM. Palliative Care was asked to  help address goals of care.   CODE STATUS: Full  PPS: 30% HOSPICE ELIGIBILITY/DIAGNOSIS: TBD  PAST MEDICAL HISTORY:  Past Medical History:  Diagnosis Date  . Anginal pain ([place])   . Aortic stenosis    a. severe by echo 09/2014  . Atrial fibrillation ([place])    a. not well documented, not on anticoagulation  . CHF (congestive heart failure) ([place])    04/28/17 echo-EF 40%, mod LVH, diastolic dysfunction  . Claustrophobia   . Complete heart block ([place])   . COPD (chronic obstructive pulmonary disease) ([place])   . Coronary artery disease    a. chronically occluded RCA per cath 09/2014 with collaterals B. cath 05/01/17 chr occ RCA w/collaterals, 60-70% mid LAD,   . CVA (cerebral vascular accident) ([place]) 10/2014   denies residual on 07/11/2015  . ESRD (end stage renal disease) on dialysis [facility])    a. on dialysis; [facility]; MWF, LUE fistula (07/11/2015)  . History of blood transfusion    "related to gallbladder OR"  . History of stomach ulcers   . Hyperlipidemia   . Hypertension   . Iron deficiency anemia   . Myocardial infarction ([place]) 10/2014  . Peripheral vascular disease ([place])   . Pneumonia   . Presence of permanent cardiac pacemaker   . 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 Type II diabetes mellitus ([place])     SOCIAL HX:  Social History   Tobacco Use  . Smoking status: Former Smoker    Packs/day: 2.00    Years: 32.00  Pack years: 6.00    Quit date: 11/27/1983    Years since quitting: 35.8  . Smokeless tobacco: Never Used  Substance Use Topics  . Alcohol use: Yes    Comment: rarely    ALLERGIES:  Allergies  Allergen Reactions  . Byetta 10 Mcg Pen [Exenatide] Diarrhea and Nausea And Vomiting  . Telmisartan Other (See Comments)    Unknown reaction - reported by [facility]  . Codeine Itching    Minor reaction  . Coumadin [Warfarin Sodium] Rash     (wife states coumadin was stopped but rash did not disappear 02/21/19)     PERTINENT MEDICATIONS:  Outpatient Encounter Medications as of 09/11/2019  Medication Sig  . acetaminophen (TYLENOL) 500 MG tablet Take 1 tablet (500 mg total) by mouth every 6 (six) hours as needed for mild pain or headache (pain). (Patient taking differently: Take 1,000 mg by mouth every 6 (six) hours as needed for headache (pain). )  . albuterol (PROVENTIL HFA;VENTOLIN HFA) 108 (90 Base) MCG/ACT inhaler Inhale 2 puffs into the lungs every 4 (four) hours as needed for wheezing or shortness of breath.  [name] albuterol (PROVENTIL) (2.5 MG/3ML) 0.083% nebulizer solution Take 3 mLs (2.5 mg total) by nebulization every 6 (six) hours as needed for wheezing or shortness of breath.  . ALPRAZolam (XANAX) 0.25 MG tablet Take 1 tablet (0.25 mg total) by mouth 2 (two) times daily as needed for anxiety.  [name] apixaban (ELIQUIS) 2.5 MG TABS tablet Take 1 tablet (2.5 mg total) by mouth 2 (two) times daily.  [name] atorvastatin (LIPITOR) 80 MG tablet Take 1 tablet (80 mg total) by mouth daily at 6 PM.  . B Complex-C-Zn-Folic Acid (DIALYVITE [id number] WITH ZINC) 0.8 MG TABS Take 1 tablet by mouth daily.  . budesonide-formoterol (SYMBICORT) 160-4.5 MCG/ACT inhaler Inhale 2 puffs into the lungs 2 (two) times daily as needed (shortness of breath).   . cinacalcet (SENSIPAR) 30 MG tablet Take 30 mg by mouth See admin instructions. Take one tablet (30 mg) by mouth on Monday, Tuesday, Thursday, Saturday after dialysis  . clopidogrel (PLAVIX) 75 MG tablet Take 1 tablet (75 mg total) by mouth daily.  [name] doxercalciferol (HECTOROL) 4 MCG/2ML injection Inject 0.5 mLs (1 mcg total) into the vein Every Tuesday,Thursday,and Saturday with dialysis.  . hydrOXYzine (ATARAX/VISTARIL) 25 MG tablet Take 1 tablet (25 mg total) by mouth every 8 (eight) hours as needed for anxiety.  . insulin detemir (LEVEMIR) 100 UNIT/ML injection Inject 0.2 mLs (20 Units total) into the skin at  bedtime.  . Insulin Pen Needle 31G X 6 MM MISC at bedtime.   [name] LEVEMIR FLEXTOUCH 100 UNIT/ML Pen INJECT 48 UNITS TOTAL INTO THE SKIN AT BEDTIME. (Patient taking differently: Inject 18 Units into the skin at bedtime. )  . lidocaine-prilocaine (EMLA) cream Apply 1 application topically as needed (dialysis days).  [name] loperamide (IMODIUM) 2 MG capsule Take 2 mg by mouth as needed for diarrhea or loose stools.   [name] loratadine (CLARITIN) 10 MG tablet Take 10 mg by mouth daily.   . Melatonin 5 MG TABS Take 5-10 mg by mouth at bedtime as needed (sleep).   . metoprolol tartrate (LOPRESSOR) 25 MG tablet Take 0.5 tablets (12.5 mg total) by mouth See admin instructions. Take 1/2 tablet (12.5 mg) by mouth on Sunday, Monday, Wednesday, Friday mornings (non-dialysis days), take 1/2 tablet (12.5 mg) every evening (Patient taking differently: Take 12.5 mg by mouth See admin instructions. Take 1/2 tablet (12.5 mg) by mouth on Sunday,, Wednesday,  Friday mornings (non-dialysis days), take 1/2 tablet (12.5 mg) every evening)  . midodrine (PROAMATINE) 10 MG tablet Take 10 mg by mouth See admin instructions. Take one tablet (10 mg) by mouth 30 minutes before dialysis on Monday, Tuesday, Thursday, Saturday  . midodrine (PROAMATINE) 2.5 MG tablet Take 1 tablet (2.5 mg total) by mouth 3 (three) times daily with meals. Patient should continue to take 10 mg in the morning prior to hemodialysis sessions. (Patient taking differently: Take 2.5 mg by mouth See admin instructions. Take one tablet (2.5 mg) by mouth three times daily on Sunday, Wednesday and Friday; take one tablet (2.5 mg) twice daily on Monday, Tuesday, Thursday, Saturday (after dialysis and in the evening) . Patient should continue to take 10 mg tablet in the morning prior to hemodialysis sessions.)  . mirtazapine (REMERON) 15 MG tablet Take 7.5 mg by mouth at bedtime. For appetite/sleep  . nitroGLYCERIN (NITROSTAT) 0.4 MG SL tablet Place 1 tablet (0.4 mg total) under the  tongue every 5 (five) minutes as needed for chest pain.  [name] oxyCODONE-acetaminophen (PERCOCET/ROXICET) 5-325 MG tablet Take 1-2 tablets by mouth every 6 (six) hours as needed for severe pain.  . OXYGEN Inhale 3 L into the lungs continuous. 2-3 lpm 24/7   . pantoprazole (PROTONIX) 40 MG tablet Take 1 tablet (40 mg total) by mouth daily.  . polyethylene glycol powder (GLYCOLAX/MIRALAX) 17 GM/SCOOP powder Take 17 g by mouth 2 (two) times daily.  . sevelamer carbonate (RENVELA) 800 MG tablet Take 1,600-2,400 mg by mouth See admin instructions. Take 3 tablets (2400 mg) by mouth with meals and 2 tablets (1600 mg) with snacks   No facility-administered encounter medications on file as of 09/11/2019.     Teodoro Spray, NP

## 2019-09-12 DIAGNOSIS — E8770 Fluid overload, unspecified: Secondary | ICD-10-CM | POA: Diagnosis not present

## 2019-09-12 DIAGNOSIS — E0822 Diabetes mellitus due to underlying condition with diabetic chronic kidney disease: Secondary | ICD-10-CM | POA: Diagnosis not present

## 2019-09-12 DIAGNOSIS — I959 Hypotension, unspecified: Secondary | ICD-10-CM | POA: Diagnosis not present

## 2019-09-12 DIAGNOSIS — J449 Chronic obstructive pulmonary disease, unspecified: Secondary | ICD-10-CM | POA: Diagnosis not present

## 2019-09-12 DIAGNOSIS — R52 Pain, unspecified: Secondary | ICD-10-CM | POA: Diagnosis not present

## 2019-09-12 DIAGNOSIS — N186 End stage renal disease: Secondary | ICD-10-CM | POA: Diagnosis not present

## 2019-09-12 DIAGNOSIS — Z87891 Personal history of nicotine dependence: Secondary | ICD-10-CM | POA: Diagnosis not present

## 2019-09-12 DIAGNOSIS — R197 Diarrhea, unspecified: Secondary | ICD-10-CM | POA: Diagnosis not present

## 2019-09-12 DIAGNOSIS — D509 Iron deficiency anemia, unspecified: Secondary | ICD-10-CM | POA: Diagnosis not present

## 2019-09-12 DIAGNOSIS — E8779 Other fluid overload: Secondary | ICD-10-CM | POA: Diagnosis not present

## 2019-09-12 DIAGNOSIS — D631 Anemia in chronic kidney disease: Secondary | ICD-10-CM | POA: Diagnosis not present

## 2019-09-12 DIAGNOSIS — N2581 Secondary hyperparathyroidism of renal origin: Secondary | ICD-10-CM | POA: Diagnosis not present

## 2019-09-12 DIAGNOSIS — Z992 Dependence on renal dialysis: Secondary | ICD-10-CM | POA: Diagnosis not present

## 2019-09-14 DIAGNOSIS — E8779 Other fluid overload: Secondary | ICD-10-CM | POA: Diagnosis not present

## 2019-09-14 DIAGNOSIS — Z992 Dependence on renal dialysis: Secondary | ICD-10-CM | POA: Diagnosis not present

## 2019-09-14 DIAGNOSIS — E0822 Diabetes mellitus due to underlying condition with diabetic chronic kidney disease: Secondary | ICD-10-CM | POA: Diagnosis not present

## 2019-09-14 DIAGNOSIS — E8770 Fluid overload, unspecified: Secondary | ICD-10-CM | POA: Diagnosis not present

## 2019-09-14 DIAGNOSIS — R197 Diarrhea, unspecified: Secondary | ICD-10-CM | POA: Diagnosis not present

## 2019-09-14 DIAGNOSIS — R52 Pain, unspecified: Secondary | ICD-10-CM | POA: Diagnosis not present

## 2019-09-14 DIAGNOSIS — I959 Hypotension, unspecified: Secondary | ICD-10-CM | POA: Diagnosis not present

## 2019-09-14 DIAGNOSIS — J449 Chronic obstructive pulmonary disease, unspecified: Secondary | ICD-10-CM | POA: Diagnosis not present

## 2019-09-14 DIAGNOSIS — N2581 Secondary hyperparathyroidism of renal origin: Secondary | ICD-10-CM | POA: Diagnosis not present

## 2019-09-14 DIAGNOSIS — D631 Anemia in chronic kidney disease: Secondary | ICD-10-CM | POA: Diagnosis not present

## 2019-09-14 DIAGNOSIS — Z87891 Personal history of nicotine dependence: Secondary | ICD-10-CM | POA: Diagnosis not present

## 2019-09-14 DIAGNOSIS — D509 Iron deficiency anemia, unspecified: Secondary | ICD-10-CM | POA: Diagnosis not present

## 2019-09-14 DIAGNOSIS — N186 End stage renal disease: Secondary | ICD-10-CM | POA: Diagnosis not present

## 2019-09-15 DIAGNOSIS — R197 Diarrhea, unspecified: Secondary | ICD-10-CM | POA: Diagnosis not present

## 2019-09-15 DIAGNOSIS — D631 Anemia in chronic kidney disease: Secondary | ICD-10-CM | POA: Diagnosis not present

## 2019-09-15 DIAGNOSIS — R52 Pain, unspecified: Secondary | ICD-10-CM | POA: Diagnosis not present

## 2019-09-15 DIAGNOSIS — N186 End stage renal disease: Secondary | ICD-10-CM | POA: Diagnosis not present

## 2019-09-15 DIAGNOSIS — N2581 Secondary hyperparathyroidism of renal origin: Secondary | ICD-10-CM | POA: Diagnosis not present

## 2019-09-15 DIAGNOSIS — Z992 Dependence on renal dialysis: Secondary | ICD-10-CM | POA: Diagnosis not present

## 2019-09-15 DIAGNOSIS — I959 Hypotension, unspecified: Secondary | ICD-10-CM | POA: Diagnosis not present

## 2019-09-15 DIAGNOSIS — E8770 Fluid overload, unspecified: Secondary | ICD-10-CM | POA: Diagnosis not present

## 2019-09-15 DIAGNOSIS — Z87891 Personal history of nicotine dependence: Secondary | ICD-10-CM | POA: Diagnosis not present

## 2019-09-15 DIAGNOSIS — D509 Iron deficiency anemia, unspecified: Secondary | ICD-10-CM | POA: Diagnosis not present

## 2019-09-15 DIAGNOSIS — J449 Chronic obstructive pulmonary disease, unspecified: Secondary | ICD-10-CM | POA: Diagnosis not present

## 2019-09-15 DIAGNOSIS — E8779 Other fluid overload: Secondary | ICD-10-CM | POA: Diagnosis not present

## 2019-09-15 DIAGNOSIS — E0822 Diabetes mellitus due to underlying condition with diabetic chronic kidney disease: Secondary | ICD-10-CM | POA: Diagnosis not present

## 2019-09-16 ENCOUNTER — Encounter (HOSPITAL_BASED_OUTPATIENT_CLINIC_OR_DEPARTMENT_OTHER): Payer: Medicare Other | Admitting: Physician Assistant

## 2019-09-16 ENCOUNTER — Other Ambulatory Visit: Payer: Self-pay

## 2019-09-16 DIAGNOSIS — L97512 Non-pressure chronic ulcer of other part of right foot with fat layer exposed: Secondary | ICD-10-CM | POA: Diagnosis not present

## 2019-09-16 DIAGNOSIS — I252 Old myocardial infarction: Secondary | ICD-10-CM | POA: Diagnosis not present

## 2019-09-16 DIAGNOSIS — J449 Chronic obstructive pulmonary disease, unspecified: Secondary | ICD-10-CM | POA: Diagnosis not present

## 2019-09-16 DIAGNOSIS — I872 Venous insufficiency (chronic) (peripheral): Secondary | ICD-10-CM | POA: Diagnosis not present

## 2019-09-16 DIAGNOSIS — E11622 Type 2 diabetes mellitus with other skin ulcer: Secondary | ICD-10-CM | POA: Diagnosis not present

## 2019-09-16 DIAGNOSIS — L97519 Non-pressure chronic ulcer of other part of right foot with unspecified severity: Secondary | ICD-10-CM | POA: Diagnosis not present

## 2019-09-16 DIAGNOSIS — E1151 Type 2 diabetes mellitus with diabetic peripheral angiopathy without gangrene: Secondary | ICD-10-CM | POA: Diagnosis not present

## 2019-09-16 DIAGNOSIS — L97812 Non-pressure chronic ulcer of other part of right lower leg with fat layer exposed: Secondary | ICD-10-CM | POA: Diagnosis not present

## 2019-09-16 DIAGNOSIS — E1122 Type 2 diabetes mellitus with diabetic chronic kidney disease: Secondary | ICD-10-CM | POA: Diagnosis not present

## 2019-09-16 DIAGNOSIS — S31829A Unspecified open wound of left buttock, initial encounter: Secondary | ICD-10-CM | POA: Diagnosis not present

## 2019-09-16 DIAGNOSIS — N186 End stage renal disease: Secondary | ICD-10-CM | POA: Diagnosis not present

## 2019-09-16 DIAGNOSIS — L97312 Non-pressure chronic ulcer of right ankle with fat layer exposed: Secondary | ICD-10-CM | POA: Diagnosis not present

## 2019-09-16 DIAGNOSIS — Z87891 Personal history of nicotine dependence: Secondary | ICD-10-CM | POA: Diagnosis not present

## 2019-09-16 DIAGNOSIS — I509 Heart failure, unspecified: Secondary | ICD-10-CM | POA: Diagnosis not present

## 2019-09-16 DIAGNOSIS — Z89512 Acquired absence of left leg below knee: Secondary | ICD-10-CM | POA: Diagnosis not present

## 2019-09-16 DIAGNOSIS — I132 Hypertensive heart and chronic kidney disease with heart failure and with stage 5 chronic kidney disease, or end stage renal disease: Secondary | ICD-10-CM | POA: Diagnosis not present

## 2019-09-16 DIAGNOSIS — T8189XA Other complications of procedures, not elsewhere classified, initial encounter: Secondary | ICD-10-CM | POA: Diagnosis not present

## 2019-09-16 NOTE — Progress Notes (Signed)
MIKLE, STERNBERG ([id number]) Visit Report for 09/16/2019 Chief Complaint Document Details Patient Name: Date of Service: [name], Jeffrey 09/16/2019 2:00 PM Medical Record [id number] Patient Account Number: [id number] Date of Birth/Sex: Treating RN: Apr 27, 1941 (78 y.o. [name] Primary Care Provider: [name] Other Clinician: Referring Provider: Treating Provider/Extender:[name], [name], [name]: 10 Information Obtained from: Patient Chief Complaint Bilateral LE Ulcers Electronic Signature(s) Signed: 09/16/2019 6:13:43 PM By: Worthy Keeler PA-C Entered By: Worthy Keeler on 09/16/2019 15:01:08 -------------------------------------------------------------------------------- HPI Details Patient Name: Date of Service: Jeffrey, BORQUEZ 09/16/2019 2:00 PM Medical Record [name] Patient Account Number: [id number] Date of Birth/Sex: Treating RN: 1941-02-08 (78 y.o. [name] Primary Care Provider: [name] Other Clinician: Referring Provider: Treating Provider/Extender:[name], [name], [name]: 10 History of Present Illness HPI Description: 07/08/2019 on evaluation today patient presents for initial evaluation in clinic concerning issues that began about 3 weeks ago he states. He recently underwent a left BKA to the under the care of Dr. Doren Custard and this was on Apr 14, 2019. Subsequently he developed an area of what appears to be abrasion just below this amputation site which they feel occurred as a result of utilization of the shrinker. With that being said the good news is this does not really appear to be doing too poorly at all. The bigger issue is that he has multiple ulcers of the right lower extremity where 2 of the areas appear to be more blisters and in fact 1 of them we did not even count as it does not appear to be an open wound at all. The other site is on the lateral  malleolus and I believe this may be not just venous related but potentially some pressure component to it as well. Nonetheless it does sound like he sleeps in his chair with his leg up on a stool and subsequently this causes some pressure to the lateral portion of his ankle as it does press on the stool his wife tells me. He is not able to lay flat such as in the bed or even reclined back in his recliner due to issues with his breathing. 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 He also has congestive heart failure. Currently they have been using meta honey on the wounds until they ran out prior to coming here. His most recent hemoglobin A1c at dialysis was 7.2. 07/15/2019 upon evaluation today patient appears to be doing overall quite poorly well with regard to his left distal stump ulcer. This has healed they have not been using the shrinker and fortunately this healed quite nicely. She did not try the foam underneath the shrinker she was afraid that it would cause too much moisture. This is his daughter. Subsequently in regard to the right lower extremity patient actually has been having to change this twice a day most days. Unfortunately he has a lot of drainage and the dressings that are putting on just do not seem to be lasting. Nonetheless I feel like we may need to initiate some kind of compression although we have to be very cautious with this based on his arterial study which shows that he does have a TBI of 0.43 this is not in the best range obviously. 07/22/2019 on evaluation today  patient appears to be doing decently well with regard to his wounds. I do not see any evidence of active infection at this time which is good news. Fortunately there is no signs of worsening and  in fact his wounds actually appear to be doing better which is great news. No fevers, chills, nausea, vomiting, or diarrhea. 07/29/19 on evaluation today patient actually appears to be doing quite well with regard to his wounds currently on the right lateral leg and right medial leg. With that being said the right lateral malleolus did have some Slough noted upon evaluation today this is gonna need to be addressed by way of sharp debridement. With that being said I feel like that we can appropriately debride this without complication which is good news. He has some discomfort but nothing too significant. 08/05/2019 on evaluation today patient actually appears to be doing quite well with regard to his left medial lower extremity ulcer and his lateral lower extremity ulcer is completely healed. With that being said he is having some issues still with the right lateral ankle I think this is simply due to the fact that he is constantly putting pressure on this when he sleeping at night or even when he is in the dialysis chair for that reason during the day. Nonetheless I believe that he would benefit from having more appropriate offloading possibly even a Prevalon boot could be of benefit to him. 08/12/2019 on evaluation today patient appears to be doing better in regard to his right lower extremity ulcers in general which is good news. Unfortunately in regard to his left lower extremity he appears to be doing more poorly today at this time. In fact the area that we were concerned about we were trying to protect last week is opened up into a true open wound from the blister that is shown up. He also has a second blister on the distal aspect of his stump which is open at this point in time just draining a little bit but not completely cleared away as far as the blistered skin is concerned. Subsequently we will get to keep an eye on this that may need to be removed or come off itself by next week we shall  see. 08/26/2019 on evaluation today patient actually was in the hospital for a period of time from 08/14/2019 through 08/20/2019. Unfortunately he had several episodes of shortness of breath with intermittent chest pain and altered mental status with decreased level of consciousness and EMS was called. He was evaluated in the hospital where throughout the hospital course he had multiple issues including some trouble with NSVT but no persistent SVT. Cardiology consider cardioversion at one point but then did not. Apparently wound care did see him for his ulcers on his lower extremities although the patient states he really was not impressed with what they were doing. He does have follow-up appointments with cardiology upon discharge as well as continuing with dialysis and seen his primary care provider in 1 to 2 weeks due to his T3 and T4 levels since he did have an elevated TSH in the hospital. His wounds today appear to be doing fairly well and in fact the left stump wounds are dramatically improved compared to when I last saw him. On the right the original wounds seem to be doing slightly better although he does have a new wound on his toe unfortunately. This is the right second toe. 09/02/2019 on evaluation today patient appears to be doing roughly about  the same in regard to his wounds in general. He has not really made significant improvements but all in all things appear to be fairly stable which is good news. Unfortunately he was in dialysis when his blood pressure and oxygen saturation dropped recently he ended up going to the hospital. He was seen in the emergency department where on 08/29/2019 he was told by the physician who was caring for him and his wife was told as well that they should call hospice in as he was not likely to live much longer. Nonetheless that is something that his wife and daughter state they do not want to do obviously under hospice he tells me the biggest issue is he is  not allowed to have dialysis which again would lead to his imminent demise. Nonetheless at this point he wants to try it as much as possible enjoy the remaining time that he has with his family which I can respect. In the meantime were trying to manage his wounds as much as we can and as well as we can though again I am not sure how likely he will be to heal everything. 09/09/2019 on evaluation today patient appears to be doing better in my opinion in regard to pre-much all of his wounds on the lower extremity. There does not appear to be any signs of active infection at this time which is good news. No fevers, chills, nausea, vomiting, or diarrhea. Overall I think that things are doing about as well as we can expect at this point. He did have a lot of fluid pulled off today and his right arm is extremely swollen that is uncomfortable for him he is most concerned about that at this time. 09/16/2019 on evaluation today patient appears to be doing well with regard to some of his wounds although he has new wounds in other locations. This includes his right third toe which unfortunately is cracked underneath and appears to be bleeding quite proficiently. We were able to get this stopped today but nonetheless that is definitely not a good sign I am not sure exactly what happened here he really does not know either. He also has new wounds on the gluteal region and the perineum specifically as well as the ischium. Unfortunately these are quite painful at this point. Overall again he seems to be doing better in some regards and worse than others. Electronic Signature(s) Signed: 09/16/2019 6:13:43 PM By: Worthy Keeler PA-C Entered By: Worthy Keeler on 09/16/2019 15:48:19 -------------------------------------------------------------------------------- Physical Exam Details Patient Name: Date of Service: [name], Jeffrey 09/16/2019 2:00 PM Medical Record [id number] Patient Account Number:  [id number] Date of Birth/Sex: Treating RN: [birth year] (78 y.o. [name] Primary Care Provider: [name] Other Clinician: Referring Provider: Treating Provider/Extender:[name], [name], [name]: 39 Constitutional Well-nourished and well-hydrated in no acute distress. Respiratory normal breathing without difficulty. clear to auscultation bilaterally. Cardiovascular regular rate and rhythm with normal S1, S2. Psychiatric this patient is able to make decisions and demonstrates good insight into disease process. Alert and Oriented x 3. pleasant and cooperative. Notes Patient's wound bed currently showed signs of good epithelization in some locations especially over the lateral portion of his right leg which is doing much better the ankle ulcer on the right lateral area seems to be doing better the left distal stump appears to also be doing better. The medial ankle on the right is doing a little bit better. Unfortunately he has new wounds on the  third toe as well as the continued wound on the second toe and this is on his right foot. And then of course he has a new wounds in the perineum and ischial region at this time. Unfortunately he spends most of his day sitting and often in the same position despite the fact we discussed this previous he is really not able to lay down unfortunately. This puts him in a bind so to speak. Electronic Signature(s) Signed: 09/16/2019 6:13:43 PM By: Worthy Keeler PA-C Entered By: Worthy Keeler on 09/16/2019 15:51:00 -------------------------------------------------------------------------------- Physician Orders Details Patient Name: Date of Service: KEMAL, AMORES 09/16/2019 2:00 PM Medical Record [id number] Patient Account Number: [id number] Date of Birth/Sex: Treating RN: Oct 09, 1941 (78 y.o. [name] Primary Care Provider: [name] Other Clinician: Referring Provider: Treating  Provider/Extender:[name], [name], [name]: 10 Verbal / Phone Orders: No Diagnosis Coding ICD-10 Coding Code Description I87.2 Venous insufficiency (chronic) (peripheral) I73.9 Peripheral vascular disease, unspecified L97.312 Non-pressure chronic ulcer of right ankle with fat layer exposed L97.812 Non-pressure chronic ulcer of other part of right lower leg with fat layer exposed Z89.512 Acquired absence of left leg below knee T81.31XA Disruption of external operation (surgical) wound, not elsewhere classified, initial encounter E11.622 Type 2 diabetes mellitus with other skin ulcer N18.6 End stage renal disease Z99.2 Dependence on renal dialysis Follow-up Appointments Return Appointment in 2 weeks. - Wednesday Nurse Visit: - 1 week for rewrap Dressing Change Frequency Wound #1 Right,Lateral Malleolus Do not change entire dressing for one week. - all right leg wounds Wound #10 Right,Plantar Toe Third Change Dressing every other day. Wound #5 Amputation Site - Below Knee Change Dressing every other day. Wound #7 Right Toe Second Change Dressing every other day. Wound #8 Left Ischium Change dressing every day. Wound #9 Perineum Change dressing every day. Skin Barriers/Peri-Wound Care Barrier cream - mix with TCA cream to lower part of right lower leg TCA Cream or Ointment Wound Cleansing Wound #1 Right,Lateral Malleolus May shower with protection. Wound #5 Amputation Site - Below Knee Clean wound with Wound Cleanser Wound #7 Right Toe Second Clean wound with Wound Cleanser Primary Wound Dressing Wound #1 Right,Lateral Malleolus Calcium Alginate with Silver Wound #4 Right,Medial Lower Leg Calcium Alginate with Silver Wound #5 Amputation Site - Below Knee Calcium Alginate with Silver Wound #7 Right Toe Second Calcium Alginate with Silver Wound #10 Right,Plantar Toe Third Calcium Alginate with Silver Wound #8 Left Ischium Calcium Alginate  with Silver Wound #9 Perineum Other: - Thick layer of zinc oxide paste (barrier paste/cream) Secondary Dressing Wound #1 Right,Lateral Malleolus Dry Gauze Wound #10 Right,Plantar Toe Third Kerlix/Rolled Gauze Dry Gauze Wound #4 Right,Medial Lower Leg Dry Gauze ABD pad Wound #5 Amputation Site - Below Knee ABD pad Wound #7 Right Toe Second Kerlix/Rolled Gauze Dry Gauze Wound #8 Left Ischium Foam Border - or bordered gauze Edema Control Kerlix and Coban - Right Lower Extremity - do not wrap too tight Elevate legs to the level of the heart or above for 30 minutes daily and/or when sitting, a frequency of: - throughout the day Other: - stump shrinker or ace wrap to left stump to hold dressing in place Off-Loading Other: - do not let ankle roll out to the side, no pressure on right lateral ankle, use piece of thick foam with hole cut out for ankle especially at night Electronic Signature(s) Signed: 09/16/2019 6:13:43 PM By: Worthy Keeler PA-C Signed: 09/16/2019 6:50:57 PM By: [name] RN,  BSN Entered By: [name] on 09/16/2019 15:44:24 -------------------------------------------------------------------------------- Problem List Details Patient Name: Date of Service: DANTRE, YEARWOOD 09/16/2019 2:00 PM Medical Record [id number] Patient Account Number: [id number] Date of Birth/Sex: Treating RN: [birth year] (78 y.o. 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 with fat layer 07/08/2019 No Yes exposed L97.812 Non-pressure chronic ulcer of other part of right  lower 07/08/2019 No Yes leg with fat layer exposed Z89.512 Acquired absence of left leg below knee 07/08/2019 No Yes T81.31XA Disruption of external operation (surgical) wound, not 07/08/2019 No Yes elsewhere classified, initial encounter E11.622 Type 2 diabetes mellitus with other skin ulcer 07/08/2019 No Yes N18.6 End stage renal disease 07/08/2019 No Yes Z99.2 Dependence on renal dialysis 07/08/2019 No Yes Inactive Problems Resolved Problems Electronic Signature(s) Signed: 09/16/2019 6:13:43 PM By: Worthy Keeler PA-C Entered By: Worthy Keeler on 09/16/2019 15:01:03 -------------------------------------------------------------------------------- Progress Note Details Patient Name: Date of Service: [name] 09/16/2019 2:00 PM Medical Record [id number] Patient Account Number: [id number] Date of Birth/Sex: Treating RN: 10-23-1941 (78 y.o. [name] Primary Care Provider: [name] Other Clinician: Referring Provider: Treating Provider/Extender:[name], [name], [name]: 10 Subjective Chief Complaint Information obtained from Patient Bilateral LE Ulcers History of Present Illness (HPI) 07/08/2019 on evaluation today patient presents for initial evaluation in clinic concerning issues that began about 3 weeks ago he states. He recently underwent a left BKA to the under the care of Dr. Doren Custard and this was on Apr 14, 2019. Subsequently he developed an area of what appears to be abrasion just below this amputation site which they feel occurred as a result of utilization of the shrinker. With that being said the good news is this does not really appear to be doing too poorly at all. The bigger issue is that he has multiple ulcers of the right lower extremity where 2 of the areas appear to be more blisters and in fact 1 of them we did not even count as it does not appear to be an open wound at all. The other site is on the lateral  malleolus and I believe this may be not just venous related but potentially some pressure component to it as well. Nonetheless it does sound like he sleeps in his chair with his leg up on a stool and subsequently this causes some pressure to the lateral portion of his ankle as it does press on the stool his wife tells me. He is not able to lay flat such as in the bed or even reclined back in his recliner due to issues with his breathing. 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 He also has congestive heart failure. Currently they have been using meta honey on the wounds until they ran out prior to coming here. His most recent hemoglobin A1c at dialysis was 7.2. 07/15/2019 upon evaluation today patient appears to be doing overall quite poorly well with regard to his left distal stump ulcer. This has healed they have not been using the shrinker and  fortunately this healed quite nicely. She did not try the foam underneath the shrinker she was afraid that it would cause too much moisture. This is his daughter. Subsequently in regard to the right lower extremity patient actually has been having to change this twice a day most days. Unfortunately he has a lot of drainage and the dressings that are putting on just do not seem to be lasting. Nonetheless I feel like we may need to initiate some kind of compression although we have to be very cautious with this based on his arterial study which shows that he does have a TBI of 0.43 this is not in the best range obviously. 07/22/2019 on evaluation today patient appears to be doing decently well with regard to his wounds. I do not see any evidence of active infection at this time which is good news. Fortunately there is no signs of worsening and  in fact his wounds actually appear to be doing better which is great news. No fevers, chills, nausea, vomiting, or diarrhea. 07/29/19 on evaluation today patient actually appears to be doing quite well with regard to his wounds currently on the right lateral leg and right medial leg. With that being said the right lateral malleolus did have some Slough noted upon evaluation today this is gonna need to be addressed by way of sharp debridement. With that being said I feel like that we can appropriately debride this without complication which is good news. He has some discomfort but nothing too significant. 08/05/2019 on evaluation today patient actually appears to be doing quite well with regard to his left medial lower extremity ulcer and his lateral lower extremity ulcer is completely healed. With that being said he is having some issues still with the right lateral ankle I think this is simply due to the fact that he is constantly putting pressure on this when he sleeping at night or even when he is in the dialysis chair for that reason during the day. Nonetheless I believe that he would benefit from having more appropriate offloading possibly even a Prevalon boot could be of benefit to him. 08/12/2019 on evaluation today patient appears to be doing better in regard to his right lower extremity ulcers in general which is good news. Unfortunately in regard to his left lower extremity he appears to be doing more poorly today at this time. In fact the area that we were concerned about we were trying to protect last week is opened up into a true open wound from the blister that is shown up. He also has a second blister on the distal aspect of his stump which is open at this point in time just draining a little bit but not completely cleared away as far as the blistered skin is concerned. Subsequently we will get to keep an eye on this that may need to be removed or come off itself by next week we shall  see. 08/26/2019 on evaluation today patient actually was in the hospital for a period of time from 08/14/2019 through 08/20/2019. Unfortunately he had several episodes of shortness of breath with intermittent chest pain and altered mental status with decreased level of consciousness and EMS was called. He was evaluated in the hospital where throughout the hospital course he had multiple issues including some trouble with NSVT but no persistent SVT. Cardiology consider cardioversion at one point but then did not. Apparently wound care did see him for his ulcers on his lower extremities although the patient states  he really was not impressed with what they were doing. He does have follow-up appointments with cardiology upon discharge as well as continuing with dialysis and seen his primary care provider in 1 to 2 weeks due to his T3 and T4 levels since he did have an elevated TSH in the hospital. His wounds today appear to be doing fairly well and in fact the left stump wounds are dramatically improved compared to when I last saw him. On the right the original wounds seem to be doing slightly better although he does have a new wound on his toe unfortunately. This is the right second toe. 09/02/2019 on evaluation today patient appears to be doing roughly about the same in regard to his wounds in general. He has not really made significant improvements but all in all things appear to be fairly stable which is good news. Unfortunately he was in dialysis when his blood pressure and oxygen saturation dropped recently he ended up going to the hospital. He was seen in the emergency department where on 08/29/2019 he was told by the physician who was caring for him and his wife was told as well that they should call hospice in as he was not likely to live much longer. Nonetheless that is something that his wife and daughter state they do not want to do obviously under hospice he tells me the biggest issue is he is  not allowed to have dialysis which again would lead to his imminent demise. Nonetheless at this point he wants to try it as much as possible enjoy the remaining time that he has with his family which I can respect. In the meantime were trying to manage his wounds as much as we can and as well as we can though again I am not sure how likely he will be to heal everything. 09/09/2019 on evaluation today patient appears to be doing better in my opinion in regard to pre-much all of his wounds on the lower extremity. There does not appear to be any signs of active infection at this time which is good news. No fevers, chills, nausea, vomiting, or diarrhea. Overall I think that things are doing about as well as we can expect at this point. He did have a lot of fluid pulled off today and his right arm is extremely swollen that is uncomfortable for him he is most concerned about that at this time. 09/16/2019 on evaluation today patient appears to be doing well with regard to some of his wounds although he has new wounds in other locations. This includes his right third toe which unfortunately is cracked underneath and appears to be bleeding quite proficiently. We were able to get this stopped today but nonetheless that is definitely not a good sign I am not sure exactly what happened here he really does not know either. He also has new wounds on the gluteal region and the perineum specifically as well as the ischium. Unfortunately these are quite painful at this point. Overall again he seems to be doing better in some regards and worse than others. Patient History Unable to Obtain Patient History due to Altered Mental Status. Information obtained from Patient. Family History Diabetes - Father, No family history of Cancer, Heart Disease, Hereditary Spherocytosis, Hypertension, Kidney Disease, Lung Disease, Seizures, Stroke, Thyroid Problems, Tuberculosis. Social History Former smoker, Marital Status -  Married, Alcohol Use - Rarely, Drug Use - No History, Caffeine Use - Never. Medical History Eyes Patient has history of Cataracts Denies  history of Glaucoma, Optic Neuritis Ear/Nose/Mouth/Throat Denies history of Chronic sinus problems/congestion, Middle ear problems Hematologic/Lymphatic Denies history of Anemia, Hemophilia, Human Immunodeficiency Virus, Lymphedema, Sickle Cell Disease Respiratory Patient has history of Chronic Obstructive Pulmonary Disease (COPD) Denies history of Aspiration, Asthma, Pneumothorax, Sleep Apnea, Tuberculosis Cardiovascular Patient has history of Congestive Heart Failure, Hypertension, Myocardial Infarction, Peripheral Venous Disease Denies history of Angina, Arrhythmia, Coronary Artery Disease, Deep Vein Thrombosis, Hypotension, Peripheral Arterial Disease, Phlebitis, Vasculitis Gastrointestinal Denies history of Cirrhosis , Colitis, Crohnoos, Hepatitis A, Hepatitis B, Hepatitis C Endocrine Patient has history of Type II Diabetes Denies history of Type I Diabetes Genitourinary Patient has history of End Stage Renal Disease Immunological Denies history of Lupus Erythematosus, Raynaudoos, Scleroderma Integumentary (Skin) Denies history of History of Burn Musculoskeletal Patient has history of Osteomyelitis - left foot Denies history of Gout, Rheumatoid Arthritis, Osteoarthritis Neurologic Denies history of Dementia, Neuropathy, Quadriplegia, Paraplegia, Seizure Disorder Oncologic Denies history of Received Chemotherapy, Received Radiation Psychiatric Denies history of Anorexia/bulimia, Confinement Anxiety Hospitalization/Surgery History - 08/14/2019 [place]- faint/ decreased LOC/ chest pain. Medical And Surgical History Notes Genitourinary dialysis Review of Systems (ROS) Constitutional Symptoms (General Health) Denies complaints or symptoms of Fatigue, Fever, Chills, Marked Weight Change. Respiratory Denies complaints or symptoms of  Chronic or frequent coughs, Shortness of Breath. Cardiovascular Denies complaints or symptoms of Chest pain. Psychiatric Denies complaints or symptoms of Claustrophobia, Suicidal. Objective Constitutional Well-nourished and well-hydrated in no acute distress. Vitals Time Taken: 3:15 PM, Height: 71 in, Weight: 209 lbs, BMI: 29.1, Temperature: 98.3 F, Pulse: 76 bpm, Respiratory Rate: 18 breaths/min, Blood Pressure: 105/70 mmHg. Respiratory normal breathing without difficulty. clear to auscultation bilaterally. Cardiovascular regular rate and rhythm with normal S1, S2. Psychiatric this patient is able to make decisions and demonstrates good insight into disease process. Alert and Oriented x 3. pleasant and cooperative. General Notes: Patient's wound bed currently showed signs of good epithelization in some locations especially over the lateral portion of his right leg which is doing much better the ankle ulcer on the right lateral area seems to be doing better the left distal stump appears to also be doing better. The medial ankle on the right is doing a little bit better. Unfortunately he has new wounds on the third toe as well as the continued wound on the second toe and this is on his right foot. And then of course he has a new wounds in the perineum and ischial region at this time. Unfortunately he spends most of his day sitting and often in the same position despite the fact we discussed this previous he is really not able to lay down unfortunately. This puts him in a bind so to speak. Integumentary (Hair, Skin) Wound #1 status is Open. Original cause of wound was Gradually Appeared. The wound is located on the Right,Lateral Malleolus. The wound measures 1cm length x 0.8cm width x 0.1cm depth; 0.628cm^2 area and 0.063cm^3 volume. There is Fat Layer (Subcutaneous Tissue) Exposed exposed. There is no tunneling or undermining noted. There is a small amount of serosanguineous drainage  noted. The wound margin is distinct with the outline attached to the wound base. There is medium (34-66%) pink granulation within the wound bed. There is a medium (34-66%) amount of necrotic tissue within the wound bed including Adherent Slough. Wound #10 status is Open. Original cause of wound was Gradually Appeared. The wound is located on the Right,Plantar Toe Third. The wound measures 0.2cm length x 1cm width x 0.2cm depth; 0.157cm^2 area and  0.031cm^3 volume. There is Fat Layer (Subcutaneous Tissue) Exposed exposed. There is no tunneling or undermining noted. There is a medium amount of sanguinous drainage noted. The wound margin is flat and intact. There is large (67-100%) red granulation within the wound bed. There is no necrotic tissue within the wound bed. Wound #4 status is Open. Original cause of wound was Blister. The wound is located on the Right,Medial Lower Leg. The wound measures 2.2cm length x 2.9cm width x 0.1cm depth; 5.011cm^2 area and 0.501cm^3 volume. There is Fat Layer (Subcutaneous Tissue) Exposed exposed. There is no tunneling or undermining noted. There is a medium amount of serosanguineous drainage noted. The wound margin is distinct with the outline attached to the wound base. There is large (67-100%) red granulation within the wound bed. There is a small (1-33%) amount of necrotic tissue within the wound bed including Adherent Slough. Wound #5 status is Open. Original cause of wound was Gradually Appeared. The wound is located on the Amputation Site - Below Knee. The wound measures 1cm length x 2.1cm width x 0.1cm depth; 1.649cm^2 area and 0.165cm^3 volume. There is Fat Layer (Subcutaneous Tissue) Exposed exposed. There is no tunneling or undermining noted. There is a small amount of serosanguineous drainage noted. The wound margin is distinct with the outline attached to the wound base. There is large (67-100%) red granulation within the wound bed. There is  no necrotic tissue within the wound bed. Wound #6 status is Healed - Epithelialized. Original cause of wound was Gradually Appeared. The wound is located on the Right,Lateral Lower Leg. The wound measures 0cm length x 0cm width x 0cm depth; 0cm^2 area and 0cm^3 volume. There is no tunneling or undermining noted. There is a none present amount of drainage noted. There is no granulation within the wound bed. There is no necrotic tissue within the wound bed. Wound #7 status is Open. Original cause of wound was Gradually Appeared. The wound is located on the Right Toe Second. The wound measures 1cm length x 1.2cm width x 0.1cm depth; 0.942cm^2 area and 0.094cm^3 volume. Wound #8 status is Open. Original cause of wound was Gradually Appeared. The wound is located on the Left Ischium. The wound measures 1.5cm length x 1.5cm width x 0.1cm depth; 1.767cm^2 area and 0.177cm^3 volume. There is Fat Layer (Subcutaneous Tissue) Exposed exposed. There is no tunneling or undermining noted. There is a medium amount of serosanguineous drainage noted. There is large (67-100%) red granulation within the wound bed. There is a small (1-33%) amount of necrotic tissue within the wound bed including Adherent Slough. Wound #9 status is Open. Original cause of wound was Gradually Appeared. The wound is located on the Perineum. The wound measures 4.5cm length x 1cm width x 0.3cm depth; 3.534cm^2 area and 1.06cm^3 volume. There is Fat Layer (Subcutaneous Tissue) Exposed exposed. There is no tunneling or undermining noted. There is a medium amount of serosanguineous drainage noted. There is medium (34-66%) red granulation within the wound bed. There is a medium (34-66%) amount of necrotic tissue within the wound bed including Adherent Slough. 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 - Wednesday Nurse Visit: - 1 week for rewrap Dressing Change Frequency: Wound #1 Right,Lateral Malleolus: Do not change entire dressing for one week. - all right leg wounds Wound #10 Right,Plantar Toe Third: Change Dressing every other day. Wound #5 Amputation Site - Below Knee: Change Dressing every other day. Wound #7 Right Toe Second: Change Dressing every other day. Wound #8 Left Ischium: Change dressing every day. Wound #9 Perineum: Change dressing every day. Skin Barriers/Peri-Wound Care: Barrier cream - mix with TCA cream to lower part of right lower leg TCA Cream or Ointment Wound Cleansing: Wound #1 Right,Lateral Malleolus: May shower with protection. Wound #5 Amputation Site - Below Knee: Clean wound with Wound Cleanser Wound #7 Right Toe Second: Clean wound with Wound Cleanser Primary Wound Dressing: Wound #1 Right,Lateral Malleolus: Calcium Alginate with Silver Wound #4 Right,Medial Lower Leg: Calcium Alginate with Silver Wound #5 Amputation Site - Below Knee: Calcium Alginate with Silver Wound #7 Right Toe Second: Calcium Alginate with Silver Wound #10 Right,Plantar Toe Third: Calcium Alginate with Silver Wound #8 Left Ischium: Calcium Alginate with Silver Wound #9 Perineum: Other: - Thick layer of zinc oxide paste (barrier paste/cream) Secondary Dressing: Wound #1 Right,Lateral Malleolus: Dry Gauze Wound #10 Right,Plantar Toe Third: Kerlix/Rolled Gauze Dry Gauze Wound #4 Right,Medial Lower Leg: Dry Gauze ABD pad Wound #5 Amputation Site - Below Knee: ABD pad Wound  #7 Right Toe Second: Kerlix/Rolled Gauze Dry Gauze Wound #8 Left Ischium: Foam Border - or bordered gauze Edema Control: Kerlix and Coban - Right Lower Extremity - do not wrap too tight Elevate legs to the level of the heart or above for 30 minutes daily and/or when sitting, a frequency of: - throughout the day Other: - stump shrinker or ace wrap to left stump to hold dressing in place Off-Loading: Other: - do not let ankle roll out to the side, no pressure on right lateral ankle, use piece of thick foam with hole cut out for ankle especially at night 1. I would recommend currently that we go ahead and continue with the silver alginate dressing really to all wound locations aside from the perineurium were were not to be able to put a dressing on this area here we will get a use Desitin which I think is the best thing to do. 2. I do believe that continue with the light compression on the right lower extremity is helping and we will continue as such. 3. With regard to offloading he needs to as much as possible attempt to roll and offload in order to prevent this from worsening I explained that is how this began and if he does not change things this will definitely worsen. He voiced understanding his daughter was present with him today which I believe was the first time I met her but nonetheless she is one of his helpers/caregivers as well. We will see patient back for reevaluation in 2 weeks here in the clinic. If anything worsens or changes patient will contact our office for additional recommendations. Electronic Signature(s) Signed: 09/16/2019 6:13:43 PM By: Worthy Keeler PA-C Entered By: Worthy Keeler on 09/16/2019 15:52:28 -------------------------------------------------------------------------------- HxROS Details Patient Name: Date of Service: Jeffrey, [name] 09/16/2019 2:00 PM Medical Record [id number] Patient Account Number: [id number] Date of Birth/Sex: Treating  RN: July 29, 1941 (78 y.o. [name] Primary Care Provider: [name] Other Clinician: Referring Provider: Treating Provider/Extender:[name], [name], [name]: 10 Unable to Obtain Patient History due to  Altered Mental Status Information Obtained From Patient Constitutional Symptoms (General Health) Complaints and Symptoms: Negative for: Fatigue; Fever; Chills; Marked Weight Change Respiratory Complaints and Symptoms: Negative for: Chronic or frequent coughs; Shortness of Breath Medical History: Positive for: Chronic Obstructive Pulmonary Disease (COPD) Negative for: Aspiration; Asthma; Pneumothorax; Sleep Apnea; Tuberculosis Cardiovascular Complaints and Symptoms: Negative for: Chest pain Medical History: Positive for: Congestive Heart Failure; Hypertension; Myocardial Infarction; Peripheral Venous Disease Negative for: Angina; Arrhythmia; Coronary Artery Disease; Deep Vein Thrombosis; Hypotension; Peripheral Arterial Disease; Phlebitis; Vasculitis Psychiatric Complaints and Symptoms: Negative for: Claustrophobia; Suicidal Medical History: Negative for: Anorexia/bulimia; Confinement Anxiety Eyes Medical History: Positive for: Cataracts Negative for: Glaucoma; Optic Neuritis Ear/Nose/Mouth/Throat Medical History: Negative for: Chronic sinus problems/congestion; Middle ear problems Hematologic/Lymphatic Medical History: Negative for: Anemia; Hemophilia; Human Immunodeficiency Virus; Lymphedema; Sickle Cell Disease Gastrointestinal Medical History: Negative for: Cirrhosis ; Colitis; Crohns; Hepatitis A; Hepatitis B; Hepatitis C Endocrine Medical History: Positive for: Type II Diabetes Negative for: Type I Diabetes Time with diabetes: 73 Treated with: Insulin Blood sugar tested every day: No Genitourinary Medical History: Positive for: End Stage Renal Disease Past Medical History Notes: dialysis Immunological Medical  History: Negative for: Lupus Erythematosus; Raynauds; Scleroderma Integumentary (Skin) Medical History: Negative for: History of Burn Musculoskeletal Medical History: Positive for: Osteomyelitis - left foot Negative for: Gout; Rheumatoid Arthritis; Osteoarthritis Neurologic Medical History: Negative for: Dementia; Neuropathy; Quadriplegia; Paraplegia; Seizure Disorder Oncologic Medical History: Negative for: Received Chemotherapy; Received Radiation HBO Extended History Items Eyes: Cataracts Immunizations Pneumococcal Vaccine: Received Pneumococcal Vaccination: No Implantable Devices Yes Hospitalization / Surgery History Type of Hospitalization/Surgery 08/14/2019 [name]- faint/ decreased LOC/ chest pain Family and Social History Cancer: No; Diabetes: Yes - Father; Heart Disease: No; Hereditary Spherocytosis: No; Hypertension: No; Kidney Disease: No; Lung Disease: No; Seizures: No; Stroke: No; Thyroid Problems: No; Tuberculosis: No; Former smoker; Marital Status - Married; Alcohol Use: Rarely; Drug Use: No History; Caffeine Use: Never; Financial Concerns: No; Food, Clothing or Shelter Needs: No; Support System Lacking: No; Transportation Concerns: No Physician Affirmation I have reviewed and agree with the above information. Electronic Signature(s) Signed: 09/16/2019 6:13:43 PM By: Worthy Keeler PA-C Signed: 09/16/2019 6:50:57 PM By: [name] RN, BSN Entered By: Worthy Keeler on 09/16/2019 15:49:05 -------------------------------------------------------------------------------- [place] Details Patient Name: Date of Service: Jeffrey, [name] 09/16/2019 Medical Record [id number] Patient Account Number: [id number] Date of Birth/Sex: Treating RN: [birth year] (78 y.o. [name] Primary Care Provider: [name] Other Clinician: Referring Provider: Treating Provider/Extender:[name], [name], [name]: 10 Diagnosis  Coding ICD-10 Codes Code Description I87.2 Venous insufficiency (chronic) (peripheral) I73.9 Peripheral vascular disease, unspecified L97.312 Non-pressure chronic ulcer of right ankle with fat layer exposed L97.812 Non-pressure chronic ulcer of other part of right lower leg with fat layer exposed Z89.512 Acquired absence of left leg below knee T81.31XA Disruption of external operation (surgical) wound, not elsewhere classified, initial encounter E11.622 Type 2 diabetes mellitus with other skin ulcer N18.6 End stage renal disease Z99.2 Dependence on renal dialysis Facility Procedures The patient participates with Medicare or their insurance follows the Medicare Facility Guidelines: CPT4 Code Description Modifier Quantity [id number] [phone] - WOUND CARE VISIT-LEV 5 EST PT 1 Physician Procedures CPT4 Code Description: [id number] - WC PHYS LEVEL 4 - EST PT ICD-10 Diagnosis Description I87.2 Venous insufficiency (chronic) (peripheral) I73.9 Peripheral vascular disease, unspecified L97.312 Non-pressure chronic ulcer of right ankle with fat laye  L97.812 Non-pressure chronic ulcer of other part of right lower Modifier: r exposed leg with fat  la Quantity: 1 yer exposed Electronic Signature(s) Signed: 09/16/2019 6:13:43 PM By: Worthy Keeler PA-C Signed: 09/16/2019 6:50:57 PM By: [name] RN, BSN Entered By: [name] on 09/16/2019 17:36:38

## 2019-09-17 DIAGNOSIS — R197 Diarrhea, unspecified: Secondary | ICD-10-CM | POA: Diagnosis not present

## 2019-09-17 DIAGNOSIS — J449 Chronic obstructive pulmonary disease, unspecified: Secondary | ICD-10-CM | POA: Diagnosis not present

## 2019-09-17 DIAGNOSIS — N186 End stage renal disease: Secondary | ICD-10-CM | POA: Diagnosis not present

## 2019-09-17 DIAGNOSIS — D631 Anemia in chronic kidney disease: Secondary | ICD-10-CM | POA: Diagnosis not present

## 2019-09-17 DIAGNOSIS — Z992 Dependence on renal dialysis: Secondary | ICD-10-CM | POA: Diagnosis not present

## 2019-09-17 DIAGNOSIS — R52 Pain, unspecified: Secondary | ICD-10-CM | POA: Diagnosis not present

## 2019-09-17 DIAGNOSIS — Z87891 Personal history of nicotine dependence: Secondary | ICD-10-CM | POA: Diagnosis not present

## 2019-09-17 DIAGNOSIS — E0822 Diabetes mellitus due to underlying condition with diabetic chronic kidney disease: Secondary | ICD-10-CM | POA: Diagnosis not present

## 2019-09-17 DIAGNOSIS — N2581 Secondary hyperparathyroidism of renal origin: Secondary | ICD-10-CM | POA: Diagnosis not present

## 2019-09-17 DIAGNOSIS — E8770 Fluid overload, unspecified: Secondary | ICD-10-CM | POA: Diagnosis not present

## 2019-09-17 DIAGNOSIS — D509 Iron deficiency anemia, unspecified: Secondary | ICD-10-CM | POA: Diagnosis not present

## 2019-09-17 DIAGNOSIS — I87312 Chronic venous hypertension (idiopathic) with ulcer of left lower extremity: Secondary | ICD-10-CM | POA: Diagnosis not present

## 2019-09-17 DIAGNOSIS — I959 Hypotension, unspecified: Secondary | ICD-10-CM | POA: Diagnosis not present

## 2019-09-17 DIAGNOSIS — E8779 Other fluid overload: Secondary | ICD-10-CM | POA: Diagnosis not present

## 2019-09-19 DIAGNOSIS — E8779 Other fluid overload: Secondary | ICD-10-CM | POA: Diagnosis not present

## 2019-09-19 DIAGNOSIS — R52 Pain, unspecified: Secondary | ICD-10-CM | POA: Diagnosis not present

## 2019-09-19 DIAGNOSIS — N186 End stage renal disease: Secondary | ICD-10-CM | POA: Diagnosis not present

## 2019-09-19 DIAGNOSIS — J449 Chronic obstructive pulmonary disease, unspecified: Secondary | ICD-10-CM | POA: Diagnosis not present

## 2019-09-19 DIAGNOSIS — N2581 Secondary hyperparathyroidism of renal origin: Secondary | ICD-10-CM | POA: Diagnosis not present

## 2019-09-19 DIAGNOSIS — Z87891 Personal history of nicotine dependence: Secondary | ICD-10-CM | POA: Diagnosis not present

## 2019-09-19 DIAGNOSIS — I959 Hypotension, unspecified: Secondary | ICD-10-CM | POA: Diagnosis not present

## 2019-09-19 DIAGNOSIS — R197 Diarrhea, unspecified: Secondary | ICD-10-CM | POA: Diagnosis not present

## 2019-09-19 DIAGNOSIS — E8770 Fluid overload, unspecified: Secondary | ICD-10-CM | POA: Diagnosis not present

## 2019-09-19 DIAGNOSIS — D631 Anemia in chronic kidney disease: Secondary | ICD-10-CM | POA: Diagnosis not present

## 2019-09-19 DIAGNOSIS — D509 Iron deficiency anemia, unspecified: Secondary | ICD-10-CM | POA: Diagnosis not present

## 2019-09-19 DIAGNOSIS — Z992 Dependence on renal dialysis: Secondary | ICD-10-CM | POA: Diagnosis not present

## 2019-09-19 DIAGNOSIS — E0822 Diabetes mellitus due to underlying condition with diabetic chronic kidney disease: Secondary | ICD-10-CM | POA: Diagnosis not present

## 2019-09-20 DIAGNOSIS — I480 Paroxysmal atrial fibrillation: Secondary | ICD-10-CM | POA: Diagnosis not present

## 2019-09-21 DIAGNOSIS — R52 Pain, unspecified: Secondary | ICD-10-CM | POA: Diagnosis not present

## 2019-09-21 DIAGNOSIS — E8779 Other fluid overload: Secondary | ICD-10-CM | POA: Diagnosis not present

## 2019-09-21 DIAGNOSIS — N186 End stage renal disease: Secondary | ICD-10-CM | POA: Diagnosis not present

## 2019-09-21 DIAGNOSIS — D631 Anemia in chronic kidney disease: Secondary | ICD-10-CM | POA: Diagnosis not present

## 2019-09-21 DIAGNOSIS — D509 Iron deficiency anemia, unspecified: Secondary | ICD-10-CM | POA: Diagnosis not present

## 2019-09-21 DIAGNOSIS — Z87891 Personal history of nicotine dependence: Secondary | ICD-10-CM | POA: Diagnosis not present

## 2019-09-21 DIAGNOSIS — J449 Chronic obstructive pulmonary disease, unspecified: Secondary | ICD-10-CM | POA: Diagnosis not present

## 2019-09-21 DIAGNOSIS — E0822 Diabetes mellitus due to underlying condition with diabetic chronic kidney disease: Secondary | ICD-10-CM | POA: Diagnosis not present

## 2019-09-21 DIAGNOSIS — Z992 Dependence on renal dialysis: Secondary | ICD-10-CM | POA: Diagnosis not present

## 2019-09-21 DIAGNOSIS — E8770 Fluid overload, unspecified: Secondary | ICD-10-CM | POA: Diagnosis not present

## 2019-09-21 DIAGNOSIS — I959 Hypotension, unspecified: Secondary | ICD-10-CM | POA: Diagnosis not present

## 2019-09-21 DIAGNOSIS — R197 Diarrhea, unspecified: Secondary | ICD-10-CM | POA: Diagnosis not present

## 2019-09-21 DIAGNOSIS — N2581 Secondary hyperparathyroidism of renal origin: Secondary | ICD-10-CM | POA: Diagnosis not present

## 2019-09-22 DIAGNOSIS — D631 Anemia in chronic kidney disease: Secondary | ICD-10-CM | POA: Diagnosis not present

## 2019-09-22 DIAGNOSIS — J449 Chronic obstructive pulmonary disease, unspecified: Secondary | ICD-10-CM | POA: Diagnosis not present

## 2019-09-22 DIAGNOSIS — D509 Iron deficiency anemia, unspecified: Secondary | ICD-10-CM | POA: Diagnosis not present

## 2019-09-22 DIAGNOSIS — R197 Diarrhea, unspecified: Secondary | ICD-10-CM | POA: Diagnosis not present

## 2019-09-22 DIAGNOSIS — E8770 Fluid overload, unspecified: Secondary | ICD-10-CM | POA: Diagnosis not present

## 2019-09-22 DIAGNOSIS — R52 Pain, unspecified: Secondary | ICD-10-CM | POA: Diagnosis not present

## 2019-09-22 DIAGNOSIS — I959 Hypotension, unspecified: Secondary | ICD-10-CM | POA: Diagnosis not present

## 2019-09-22 DIAGNOSIS — N186 End stage renal disease: Secondary | ICD-10-CM | POA: Diagnosis not present

## 2019-09-22 DIAGNOSIS — E8779 Other fluid overload: Secondary | ICD-10-CM | POA: Diagnosis not present

## 2019-09-22 DIAGNOSIS — N2581 Secondary hyperparathyroidism of renal origin: Secondary | ICD-10-CM | POA: Diagnosis not present

## 2019-09-22 DIAGNOSIS — Z87891 Personal history of nicotine dependence: Secondary | ICD-10-CM | POA: Diagnosis not present

## 2019-09-22 DIAGNOSIS — Z992 Dependence on renal dialysis: Secondary | ICD-10-CM | POA: Diagnosis not present

## 2019-09-22 DIAGNOSIS — E0822 Diabetes mellitus due to underlying condition with diabetic chronic kidney disease: Secondary | ICD-10-CM | POA: Diagnosis not present

## 2019-09-23 ENCOUNTER — Other Ambulatory Visit: Payer: Self-pay

## 2019-09-23 ENCOUNTER — Ambulatory Visit (HOSPITAL_BASED_OUTPATIENT_CLINIC_OR_DEPARTMENT_OTHER): Payer: Medicare Other | Admitting: Physician Assistant

## 2019-09-23 ENCOUNTER — Encounter (HOSPITAL_BASED_OUTPATIENT_CLINIC_OR_DEPARTMENT_OTHER): Payer: Medicare Other | Admitting: Physician Assistant

## 2019-09-23 DIAGNOSIS — Z87891 Personal history of nicotine dependence: Secondary | ICD-10-CM | POA: Diagnosis not present

## 2019-09-23 DIAGNOSIS — E1151 Type 2 diabetes mellitus with diabetic peripheral angiopathy without gangrene: Secondary | ICD-10-CM | POA: Diagnosis not present

## 2019-09-23 DIAGNOSIS — L97812 Non-pressure chronic ulcer of other part of right lower leg with fat layer exposed: Secondary | ICD-10-CM | POA: Diagnosis not present

## 2019-09-23 DIAGNOSIS — E11622 Type 2 diabetes mellitus with other skin ulcer: Secondary | ICD-10-CM | POA: Diagnosis not present

## 2019-09-23 DIAGNOSIS — I132 Hypertensive heart and chronic kidney disease with heart failure and with stage 5 chronic kidney disease, or end stage renal disease: Secondary | ICD-10-CM | POA: Diagnosis not present

## 2019-09-23 DIAGNOSIS — S31829A Unspecified open wound of left buttock, initial encounter: Secondary | ICD-10-CM | POA: Diagnosis not present

## 2019-09-23 DIAGNOSIS — L97312 Non-pressure chronic ulcer of right ankle with fat layer exposed: Secondary | ICD-10-CM | POA: Diagnosis not present

## 2019-09-23 DIAGNOSIS — L97519 Non-pressure chronic ulcer of other part of right foot with unspecified severity: Secondary | ICD-10-CM | POA: Diagnosis not present

## 2019-09-23 DIAGNOSIS — I252 Old myocardial infarction: Secondary | ICD-10-CM | POA: Diagnosis not present

## 2019-09-23 DIAGNOSIS — Z89512 Acquired absence of left leg below knee: Secondary | ICD-10-CM | POA: Diagnosis not present

## 2019-09-23 DIAGNOSIS — I872 Venous insufficiency (chronic) (peripheral): Secondary | ICD-10-CM | POA: Diagnosis not present

## 2019-09-23 DIAGNOSIS — I509 Heart failure, unspecified: Secondary | ICD-10-CM | POA: Diagnosis not present

## 2019-09-23 DIAGNOSIS — J449 Chronic obstructive pulmonary disease, unspecified: Secondary | ICD-10-CM | POA: Diagnosis not present

## 2019-09-23 DIAGNOSIS — N186 End stage renal disease: Secondary | ICD-10-CM | POA: Diagnosis not present

## 2019-09-23 DIAGNOSIS — E1122 Type 2 diabetes mellitus with diabetic chronic kidney disease: Secondary | ICD-10-CM | POA: Diagnosis not present

## 2019-09-24 DIAGNOSIS — N2581 Secondary hyperparathyroidism of renal origin: Secondary | ICD-10-CM | POA: Diagnosis not present

## 2019-09-24 DIAGNOSIS — N186 End stage renal disease: Secondary | ICD-10-CM | POA: Diagnosis not present

## 2019-09-24 DIAGNOSIS — I959 Hypotension, unspecified: Secondary | ICD-10-CM | POA: Diagnosis not present

## 2019-09-24 DIAGNOSIS — E0822 Diabetes mellitus due to underlying condition with diabetic chronic kidney disease: Secondary | ICD-10-CM | POA: Diagnosis not present

## 2019-09-24 DIAGNOSIS — R197 Diarrhea, unspecified: Secondary | ICD-10-CM | POA: Diagnosis not present

## 2019-09-24 DIAGNOSIS — E8779 Other fluid overload: Secondary | ICD-10-CM | POA: Diagnosis not present

## 2019-09-24 DIAGNOSIS — Z87891 Personal history of nicotine dependence: Secondary | ICD-10-CM | POA: Diagnosis not present

## 2019-09-24 DIAGNOSIS — D631 Anemia in chronic kidney disease: Secondary | ICD-10-CM | POA: Diagnosis not present

## 2019-09-24 DIAGNOSIS — D509 Iron deficiency anemia, unspecified: Secondary | ICD-10-CM | POA: Diagnosis not present

## 2019-09-24 DIAGNOSIS — E8770 Fluid overload, unspecified: Secondary | ICD-10-CM | POA: Diagnosis not present

## 2019-09-24 DIAGNOSIS — Z992 Dependence on renal dialysis: Secondary | ICD-10-CM | POA: Diagnosis not present

## 2019-09-24 DIAGNOSIS — J449 Chronic obstructive pulmonary disease, unspecified: Secondary | ICD-10-CM | POA: Diagnosis not present

## 2019-09-24 DIAGNOSIS — R52 Pain, unspecified: Secondary | ICD-10-CM | POA: Diagnosis not present

## 2019-09-26 DIAGNOSIS — Z992 Dependence on renal dialysis: Secondary | ICD-10-CM | POA: Diagnosis not present

## 2019-09-26 DIAGNOSIS — N186 End stage renal disease: Secondary | ICD-10-CM | POA: Diagnosis not present

## 2019-09-26 DIAGNOSIS — E1129 Type 2 diabetes mellitus with other diabetic kidney complication: Secondary | ICD-10-CM | POA: Diagnosis not present

## 2019-09-28 DIAGNOSIS — Z87891 Personal history of nicotine dependence: Secondary | ICD-10-CM | POA: Diagnosis not present

## 2019-09-28 DIAGNOSIS — D631 Anemia in chronic kidney disease: Secondary | ICD-10-CM | POA: Diagnosis not present

## 2019-09-28 DIAGNOSIS — E8770 Fluid overload, unspecified: Secondary | ICD-10-CM | POA: Diagnosis not present

## 2019-09-28 DIAGNOSIS — D509 Iron deficiency anemia, unspecified: Secondary | ICD-10-CM | POA: Diagnosis not present

## 2019-09-28 DIAGNOSIS — R52 Pain, unspecified: Secondary | ICD-10-CM | POA: Diagnosis not present

## 2019-09-28 DIAGNOSIS — Z992 Dependence on renal dialysis: Secondary | ICD-10-CM | POA: Diagnosis not present

## 2019-09-28 DIAGNOSIS — E8779 Other fluid overload: Secondary | ICD-10-CM | POA: Diagnosis not present

## 2019-09-28 DIAGNOSIS — E0822 Diabetes mellitus due to underlying condition with diabetic chronic kidney disease: Secondary | ICD-10-CM | POA: Diagnosis not present

## 2019-09-28 DIAGNOSIS — R197 Diarrhea, unspecified: Secondary | ICD-10-CM | POA: Diagnosis not present

## 2019-09-28 DIAGNOSIS — J449 Chronic obstructive pulmonary disease, unspecified: Secondary | ICD-10-CM | POA: Diagnosis not present

## 2019-09-28 DIAGNOSIS — I959 Hypotension, unspecified: Secondary | ICD-10-CM | POA: Diagnosis not present

## 2019-09-28 DIAGNOSIS — N186 End stage renal disease: Secondary | ICD-10-CM | POA: Diagnosis not present

## 2019-09-28 DIAGNOSIS — N2581 Secondary hyperparathyroidism of renal origin: Secondary | ICD-10-CM | POA: Diagnosis not present

## 2019-09-29 DIAGNOSIS — E8779 Other fluid overload: Secondary | ICD-10-CM | POA: Diagnosis not present

## 2019-09-29 DIAGNOSIS — E0822 Diabetes mellitus due to underlying condition with diabetic chronic kidney disease: Secondary | ICD-10-CM | POA: Diagnosis not present

## 2019-09-29 DIAGNOSIS — N186 End stage renal disease: Secondary | ICD-10-CM | POA: Diagnosis not present

## 2019-09-29 DIAGNOSIS — R52 Pain, unspecified: Secondary | ICD-10-CM | POA: Diagnosis not present

## 2019-09-29 DIAGNOSIS — N2581 Secondary hyperparathyroidism of renal origin: Secondary | ICD-10-CM | POA: Diagnosis not present

## 2019-09-29 DIAGNOSIS — D631 Anemia in chronic kidney disease: Secondary | ICD-10-CM | POA: Diagnosis not present

## 2019-09-29 DIAGNOSIS — J449 Chronic obstructive pulmonary disease, unspecified: Secondary | ICD-10-CM | POA: Diagnosis not present

## 2019-09-29 DIAGNOSIS — Z87891 Personal history of nicotine dependence: Secondary | ICD-10-CM | POA: Diagnosis not present

## 2019-09-29 DIAGNOSIS — Z992 Dependence on renal dialysis: Secondary | ICD-10-CM | POA: Diagnosis not present

## 2019-09-29 DIAGNOSIS — E8770 Fluid overload, unspecified: Secondary | ICD-10-CM | POA: Diagnosis not present

## 2019-09-29 DIAGNOSIS — R197 Diarrhea, unspecified: Secondary | ICD-10-CM | POA: Diagnosis not present

## 2019-09-29 DIAGNOSIS — D509 Iron deficiency anemia, unspecified: Secondary | ICD-10-CM | POA: Diagnosis not present

## 2019-09-29 DIAGNOSIS — I959 Hypotension, unspecified: Secondary | ICD-10-CM | POA: Diagnosis not present

## 2019-09-30 ENCOUNTER — Other Ambulatory Visit: Payer: Self-pay

## 2019-09-30 ENCOUNTER — Encounter (HOSPITAL_BASED_OUTPATIENT_CLINIC_OR_DEPARTMENT_OTHER): Payer: Medicare Other | Attending: Physician Assistant | Admitting: Physician Assistant

## 2019-09-30 DIAGNOSIS — E1122 Type 2 diabetes mellitus with diabetic chronic kidney disease: Secondary | ICD-10-CM | POA: Diagnosis not present

## 2019-09-30 DIAGNOSIS — Z794 Long term (current) use of insulin: Secondary | ICD-10-CM | POA: Diagnosis not present

## 2019-09-30 DIAGNOSIS — E11621 Type 2 diabetes mellitus with foot ulcer: Secondary | ICD-10-CM | POA: Insufficient documentation

## 2019-09-30 DIAGNOSIS — Z89512 Acquired absence of left leg below knee: Secondary | ICD-10-CM | POA: Insufficient documentation

## 2019-09-30 DIAGNOSIS — L97822 Non-pressure chronic ulcer of other part of left lower leg with fat layer exposed: Secondary | ICD-10-CM | POA: Diagnosis not present

## 2019-09-30 DIAGNOSIS — I252 Old myocardial infarction: Secondary | ICD-10-CM | POA: Insufficient documentation

## 2019-09-30 DIAGNOSIS — L98492 Non-pressure chronic ulcer of skin of other sites with fat layer exposed: Secondary | ICD-10-CM | POA: Insufficient documentation

## 2019-09-30 DIAGNOSIS — I132 Hypertensive heart and chronic kidney disease with heart failure and with stage 5 chronic kidney disease, or end stage renal disease: Secondary | ICD-10-CM | POA: Insufficient documentation

## 2019-09-30 DIAGNOSIS — I509 Heart failure, unspecified: Secondary | ICD-10-CM | POA: Insufficient documentation

## 2019-09-30 DIAGNOSIS — E11622 Type 2 diabetes mellitus with other skin ulcer: Secondary | ICD-10-CM | POA: Diagnosis not present

## 2019-09-30 DIAGNOSIS — N186 End stage renal disease: Secondary | ICD-10-CM | POA: Diagnosis not present

## 2019-09-30 DIAGNOSIS — L97512 Non-pressure chronic ulcer of other part of right foot with fat layer exposed: Secondary | ICD-10-CM | POA: Insufficient documentation

## 2019-09-30 DIAGNOSIS — E1151 Type 2 diabetes mellitus with diabetic peripheral angiopathy without gangrene: Secondary | ICD-10-CM | POA: Insufficient documentation

## 2019-09-30 DIAGNOSIS — Y835 Amputation of limb(s) as the cause of abnormal reaction of the patient, or of later complication, without mention of misadventure at the time of the procedure: Secondary | ICD-10-CM | POA: Diagnosis not present

## 2019-09-30 DIAGNOSIS — L98412 Non-pressure chronic ulcer of buttock with fat layer exposed: Secondary | ICD-10-CM | POA: Insufficient documentation

## 2019-09-30 DIAGNOSIS — E1136 Type 2 diabetes mellitus with diabetic cataract: Secondary | ICD-10-CM | POA: Insufficient documentation

## 2019-09-30 DIAGNOSIS — L97312 Non-pressure chronic ulcer of right ankle with fat layer exposed: Secondary | ICD-10-CM | POA: Diagnosis not present

## 2019-09-30 DIAGNOSIS — T8781 Dehiscence of amputation stump: Secondary | ICD-10-CM | POA: Insufficient documentation

## 2019-09-30 DIAGNOSIS — J449 Chronic obstructive pulmonary disease, unspecified: Secondary | ICD-10-CM | POA: Insufficient documentation

## 2019-09-30 DIAGNOSIS — Z992 Dependence on renal dialysis: Secondary | ICD-10-CM | POA: Insufficient documentation

## 2019-09-30 DIAGNOSIS — I872 Venous insufficiency (chronic) (peripheral): Secondary | ICD-10-CM | POA: Insufficient documentation

## 2019-09-30 DIAGNOSIS — L97812 Non-pressure chronic ulcer of other part of right lower leg with fat layer exposed: Secondary | ICD-10-CM | POA: Diagnosis not present

## 2019-09-30 NOTE — Progress Notes (Signed)
THADDIUS, NEALE ([id number]) Visit Report for 09/30/2019 Chief Complaint Document Details Patient Name: Date of Service: CAYDE, MASLEY 09/30/2019 10:15 AM Medical Record [id number] Patient Account Number: [id number] Date of Birth/Sex: Treating RN: 07/30/1941 (78 y.o. [name] Primary Care Provider: [name] Other Clinician: Referring Provider: Treating Provider/Extender:[name], [name], [name]: 12 Information Obtained from: Patient Chief Complaint Bilateral LE Ulcers Electronic Signature(s) Signed: 09/30/2019 6:48:11 PM By: Worthy Keeler PA-C Entered By: Worthy Keeler on 09/30/2019 11:05:25 -------------------------------------------------------------------------------- HPI Details Patient Name: Date of Service: BRETLEY, MENDILLO 09/30/2019 10:15 AM Medical Record [id number] Patient Account Number: [id number] Date of Birth/Sex: Treating RN: Mar 24, 1941 (78 y.o. [name] Primary Care Provider: [name] Other Clinician: Referring Provider: Treating Provider/Extender:[name], [name], [name]: 12 History of Present Illness HPI Description: 07/08/2019 on evaluation today patient presents for initial evaluation in clinic concerning issues that began about 3 weeks ago he states. He recently underwent a left BKA to the under the care of Dr. Doren Custard and this was on Apr 14, 2019. Subsequently he developed an area of what appears to be abrasion just below this amputation site which they feel occurred as a result of utilization of the shrinker. With that being said the good news is this does not really appear to be doing too poorly at all. The bigger issue is that he has multiple ulcers of the right lower extremity where 2 of the areas appear to be more blisters and in fact 1 of them we did not even count as it does not appear to be an open wound at all. The other site is on the lateral  malleolus and I believe this may be not just venous related but potentially some pressure component to it as well. Nonetheless it does sound like he sleeps in his chair with his leg up on a stool and subsequently this causes some pressure to the lateral portion of his ankle as it does press on the stool his wife tells me. He is not able to lay flat such as in the bed or even reclined back in his recliner due to issues with his breathing. 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 He also has congestive heart failure. Currently they have been using meta honey on the wounds until they ran out prior to coming here. His most recent hemoglobin A1c at dialysis was 7.2. 07/15/2019 upon evaluation today patient appears to be doing overall quite poorly well with regard to his left distal stump ulcer. This has healed they have not been using the shrinker and fortunately this healed quite nicely. She did not try the foam underneath the shrinker she was afraid that it would cause too much moisture. This is his daughter. Subsequently in regard to the right lower extremity patient actually has been having to change this twice a day most days. Unfortunately he has a lot of drainage and the dressings that are putting on just do not seem to be lasting. Nonetheless I feel like we may need to initiate some kind of compression although we have to be very cautious with this based on his arterial study which shows that he does have a TBI of 0.43 this is not in the best range obviously. 07/22/2019 on evaluation today  patient appears to be doing decently well with regard to his wounds. I do not see any evidence of active infection at this time which is good news. Fortunately there is no signs of worsening and  in fact his wounds actually appear to be doing better which is great news. No fevers, chills, nausea, vomiting, or diarrhea. 07/29/19 on evaluation today patient actually appears to be doing quite well with regard to his wounds currently on the right lateral leg and right medial leg. With that being said the right lateral malleolus did have some Slough noted upon evaluation today this is gonna need to be addressed by way of sharp debridement. With that being said I feel like that we can appropriately debride this without complication which is good news. He has some discomfort but nothing too significant. 08/05/2019 on evaluation today patient actually appears to be doing quite well with regard to his left medial lower extremity ulcer and his lateral lower extremity ulcer is completely healed. With that being said he is having some issues still with the right lateral ankle I think this is simply due to the fact that he is constantly putting pressure on this when he sleeping at night or even when he is in the dialysis chair for that reason during the day. Nonetheless I believe that he would benefit from having more appropriate offloading possibly even a Prevalon boot could be of benefit to him. 08/12/2019 on evaluation today patient appears to be doing better in regard to his right lower extremity ulcers in general which is good news. Unfortunately in regard to his left lower extremity he appears to be doing more poorly today at this time. In fact the area that we were concerned about we were trying to protect last week is opened up into a true open wound from the blister that is shown up. He also has a second blister on the distal aspect of his stump which is open at this point in time just draining a little bit but not completely cleared away as far as the blistered skin is concerned. Subsequently we will get to keep an eye on this that may need to be removed or come off itself by next week we shall  see. 08/26/2019 on evaluation today patient actually was in the hospital for a period of time from 08/14/2019 through 08/20/2019. Unfortunately he had several episodes of shortness of breath with intermittent chest pain and altered mental status with decreased level of consciousness and EMS was called. He was evaluated in the hospital where throughout the hospital course he had multiple issues including some trouble with NSVT but no persistent SVT. Cardiology consider cardioversion at one point but then did not. Apparently wound care did see him for his ulcers on his lower extremities although the patient states he really was not impressed with what they were doing. He does have follow-up appointments with cardiology upon discharge as well as continuing with dialysis and seen his primary care provider in 1 to 2 weeks due to his T3 and T4 levels since he did have an elevated TSH in the hospital. His wounds today appear to be doing fairly well and in fact the left stump wounds are dramatically improved compared to when I last saw him. On the right the original wounds seem to be doing slightly better although he does have a new wound on his toe unfortunately. This is the right second toe. 09/02/2019 on evaluation today patient appears to be doing roughly about  the same in regard to his wounds in general. He has not really made significant improvements but all in all things appear to be fairly stable which is good news. Unfortunately he was in dialysis when his blood pressure and oxygen saturation dropped recently he ended up going to the hospital. He was seen in the emergency department where on 08/29/2019 he was told by the physician who was caring for him and his wife was told as well that they should call hospice in as he was not likely to live much longer. Nonetheless that is something that his wife and daughter state they do not want to do obviously under hospice he tells me the biggest issue is he is  not allowed to have dialysis which again would lead to his imminent demise. Nonetheless at this point he wants to try it as much as possible enjoy the remaining time that he has with his family which I can respect. In the meantime were trying to manage his wounds as much as we can and as well as we can though again I am not sure how likely he will be to heal everything. 09/09/2019 on evaluation today patient appears to be doing better in my opinion in regard to pre-much all of his wounds on the lower extremity. There does not appear to be any signs of active infection at this time which is good news. No fevers, chills, nausea, vomiting, or diarrhea. Overall I think that things are doing about as well as we can expect at this point. He did have a lot of fluid pulled off today and his right arm is extremely swollen that is uncomfortable for him he is most concerned about that at this time. 09/16/2019 on evaluation today patient appears to be doing well with regard to some of his wounds although he has new wounds in other locations. This includes his right third toe which unfortunately is cracked underneath and appears to be bleeding quite proficiently. We were able to get this stopped today but nonetheless that is definitely not a good sign I am not sure exactly what happened here he really does not know either. He also has new wounds on the gluteal region and the perineum specifically as well as the ischium. Unfortunately these are quite painful at this point. Overall again he seems to be doing better in some regards and worse than others. 09/30/2019 on evaluation today patient actually appears to be doing fairly well with regard to his wounds. He has been tolerating the dressing changes without complication. Most of the wounds are measuring at least the same if not a little better across the board and in regard to his toes they seem to be drying up quite a bit and the area underneath where he was  bleeding so much on the plantar aspect of his toe actually is not bleeding as much today though I was very cautious not to pull or try to stretch it too much as that obviously would likely start the bleeding all over again. Electronic Signature(s) Signed: 09/30/2019 6:48:11 PM By: Worthy Keeler PA-C Entered By: Worthy Keeler on 09/30/2019 11:48:42 -------------------------------------------------------------------------------- Physical Exam Details Patient Name: Date of Service: DARCY, SCHLEPP 09/30/2019 10:15 AM Medical Record [id number] Patient Account Number: [id number] Date of Birth/Sex: Treating RN: Apr 21, 1941 (78 y.o. [name] Primary Care Provider: [name] Other Clinician: Referring Provider: Treating Provider/Extender:[name], [name], [name]: 12 Constitutional Chronically ill appearing but in no apparent acute  distress. Respiratory normal breathing without difficulty. clear to auscultation bilaterally. Cardiovascular regular rate and rhythm with normal S1, S2. Psychiatric this patient is able to make decisions and demonstrates good insight into disease process. Alert and Oriented x 3. pleasant and cooperative. Notes Upon evaluation today patient's wound bed actually showed signs of good granulation at this time there does not appear to be any evidence of active infection overall I am fairly pleased with the appearance of most of his wounds. In fact they appear to be either smaller or at least maintaining at this point which really is the main goal. He is aware and we have discussed before that I am not really certain that working to get to the point of healing especially in light of his arterial flow as well as in light of his overall health condition at this point which is not very good. Electronic Signature(s) Signed: 09/30/2019 6:48:11 PM By: Worthy Keeler PA-C Entered By: Worthy Keeler on 09/30/2019  11:49:22 -------------------------------------------------------------------------------- Physician Orders Details Patient Name: Date of Service: LORRIS, WASMUND 09/30/2019 10:15 AM Medical Record [id number] Patient Account Number: [id number] Date of Birth/Sex: Treating RN: 03-29-1941 (78 y.o. [name] Primary Care Provider: [name] Other Clinician: Referring Provider: Treating Provider/Extender:[name], [name], [name]: 63 Verbal / Phone Orders: No Diagnosis Coding ICD-10 Coding Code Description I87.2 Venous insufficiency (chronic) (peripheral) I73.9 Peripheral vascular disease, unspecified L97.312 Non-pressure chronic ulcer of right ankle with fat layer exposed L97.812 Non-pressure chronic ulcer of other part of right lower leg with fat layer exposed Z89.512 Acquired absence of left leg below knee T81.31XA Disruption of external operation (surgical) wound, not elsewhere classified, initial encounter E11.622 Type 2 diabetes mellitus with other skin ulcer N18.6 End stage renal disease Z99.2 Dependence on renal dialysis Follow-up Appointments Return Appointment in 1 week. Dressing Change Frequency Wound #1 Right,Lateral Malleolus Do not change entire dressing for one week. - all right leg wounds Wound #10 Right,Plantar Toe Third Change Dressing every other day. Wound #5 Amputation Site - Below Knee Change Dressing every other day. Wound #7 Right Toe Second Change Dressing every other day. Wound #8 Left Ischium Change dressing every day. Wound #9 Perineum Change dressing every day. Skin Barriers/Peri-Wound Care TCA Cream or Ointment - to right lower leg Wound Cleansing Wound #1 Right,Lateral Malleolus May shower with protection. Wound #5 Amputation Site - Below Knee Clean wound with Wound Cleanser Wound #7 Right Toe Second Clean wound with Wound Cleanser Primary Wound Dressing Wound #1 Right,Lateral  Malleolus Calcium Alginate with Silver Wound #10 Right,Plantar Toe Third Calcium Alginate with Silver Wound #4 Right,Medial Lower Leg Calcium Alginate with Silver Wound #5 Amputation Site - Below Knee Calcium Alginate with Silver Wound #7 Right Toe Second Calcium Alginate with Silver Wound #8 Left Ischium Calcium Alginate with Silver Wound #9 Perineum Other: - Thick layer of zinc oxide paste (barrier paste/cream) Secondary Dressing Wound #1 Right,Lateral Malleolus Dry Gauze Wound #10 Right,Plantar Toe Third Kerlix/Rolled Gauze Dry Gauze Wound #4 Right,Medial Lower Leg Dry Gauze ABD pad Wound #5 Amputation Site - Below Knee ABD pad Wound #7 Right Toe Second Kerlix/Rolled Gauze Dry Gauze Wound #8 Left Ischium Foam Border - or bordered gauze Edema Control Kerlix and Coban - Right Lower Extremity - do not wrap too tight Elevate legs to the level of the heart or above for 30 minutes daily and/or when sitting, a frequency of: - throughout the day Other: - stump shrinker or ace wrap to left  stump to hold dressing in place Off-Loading Other: - do not let ankle roll out to the side, no pressure on right lateral ankle, use piece of thick foam with hole cut out for ankle especially at night [place] Signature(s) Signed: 09/30/2019 6:42:58 PM By: Baruch Gouty RN, BSN Signed: 09/30/2019 6:48:11 PM By: Worthy Keeler PA-C Entered By: Baruch Gouty on 09/30/2019 11:49:17 -------------------------------------------------------------------------------- Problem List Details Patient Name: Date of Service: [name] 09/30/2019 10:15 AM Medical Record [id number] Patient Account Number: [id number] Date of Birth/Sex: Treating RN: Sep 14, 1941 (78 y.o. [name] Primary Care Provider: [name] Other Clinician: Referring Provider: Treating Provider/Extender:[name], [name], [name] Weeks in  Treatment: 12 Active Problems ICD-10 Evaluated Encounter Code Description Active Date Today Diagnosis I87.2 Venous insufficiency (chronic) (peripheral) 07/08/2019 No Yes I73.9 Peripheral vascular disease, unspecified 07/08/2019 No Yes L97.312 Non-pressure chronic ulcer of right ankle with fat layer 07/08/2019 No Yes exposed L97.812 Non-pressure chronic ulcer of other part of right lower 07/08/2019 No Yes leg with fat layer exposed Z89.512 Acquired absence of left leg below knee 07/08/2019 No Yes T81.31XA Disruption of external operation (surgical) wound, not 07/08/2019 No Yes elsewhere classified, initial encounter E11.622 Type 2 diabetes mellitus with other skin ulcer 07/08/2019 No Yes N18.6 End stage renal disease 07/08/2019 No Yes Z99.2 Dependence on renal dialysis 07/08/2019 No Yes Inactive Problems Resolved Problems Electronic Signature(s) Signed: 09/30/2019 6:48:11 PM By: Worthy Keeler PA-C Entered By: Worthy Keeler on 09/30/2019 11:05:21 -------------------------------------------------------------------------------- Progress Note Details Patient Name: Date of Service: HEROLD, SLATES 09/30/2019 10:15 AM Medical Record [id number] Patient Account Number: [id number] Date of Birth/Sex: Treating RN: 1940-12-23 (78 y.o. [name] Primary Care Provider: [name] Other Clinician: Referring Provider: Treating Provider/Extender:[name], [name], [name]: 12 Subjective Chief Complaint Information obtained from Patient Bilateral LE Ulcers History of Present Illness (HPI) 07/08/2019 on evaluation today patient presents for initial evaluation in clinic concerning issues that began about 3 weeks ago he states. He recently underwent a left BKA to the under the care of Dr. Doren Custard and this was on Apr 14, 2019. Subsequently he developed an area of what appears to be abrasion just below this amputation site which they feel occurred as a result  of utilization of the shrinker. With that being said the good news is this does not really appear to be doing too poorly at all. The bigger issue is that he has multiple ulcers of the right lower extremity where 2 of the areas appear to be more blisters and in fact 1 of them we did not even count as it does not appear to be an open wound at all. The other site is on the lateral malleolus and I believe this may be not just venous related but potentially some pressure component to it as well. Nonetheless it does sound like he sleeps in his chair with his leg up on a stool and subsequently this causes some pressure to the lateral portion of his ankle as it does press on the stool his wife tells me. He is not able to lay flat such as in the bed or even reclined back in his recliner due to issues with his breathing. 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 He also has congestive heart failure. Currently they have been using meta honey on the wounds until they ran out prior to coming here. His most recent hemoglobin A1c at dialysis was 7.2. 07/15/2019 upon evaluation today patient appears to be doing overall quite poorly well with regard to his left distal stump ulcer. This has healed they have not been using the shrinker and fortunately this healed quite nicely. She did not try the foam underneath the shrinker she was afraid that it would cause too much moisture. This is his daughter. Subsequently in regard to the right lower extremity patient actually has been having to change this twice a day most days. Unfortunately he has a lot of drainage and the dressings that are putting on just do not seem to be lasting. Nonetheless I feel like we may need to initiate some kind of compression  although we have to be very cautious with this based on his arterial study which shows that he does have a TBI of 0.43 this is not in the best range obviously. 07/22/2019 on evaluation today patient appears to be doing decently well with regard to his wounds. I do not see any evidence of active infection at this time which is good news. Fortunately there is no signs of worsening and in fact his wounds actually appear to be doing better which is great news. No fevers, chills, nausea, vomiting, or diarrhea. 07/29/19 on evaluation today patient actually appears to be doing quite well with regard to his wounds currently on the right lateral leg and right medial leg. With that being said the right lateral malleolus did have some Slough noted upon evaluation today this is gonna need to be addressed by way of sharp debridement. With that being said I feel like that we can appropriately debride this without complication which is good news. He has some discomfort but nothing too significant. 08/05/2019 on evaluation today patient actually appears to be doing quite well with regard to his left medial lower extremity ulcer and his lateral lower extremity ulcer is completely healed. With that being said he is having some issues still with the right lateral ankle I think this is simply due to the fact that he is constantly putting pressure on this when he sleeping at night or even when he is in the dialysis chair for that reason during the day. Nonetheless I believe that he would benefit from having more appropriate offloading possibly even a Prevalon boot could be of benefit to him. 08/12/2019 on evaluation today patient appears to be doing better in regard to his right lower extremity ulcers in general which is good news. Unfortunately in regard to his left lower extremity he appears to be doing more poorly today at this time. In fact the area that we were concerned about we were trying to protect last week is  opened up into a true open wound from the blister that is shown up. He also has a second blister on the distal aspect of his stump which is open at this point in time just draining a little bit but not completely cleared away as far as the blistered skin is concerned. Subsequently we will get to keep an eye on this that may need to be removed or come off itself by next week we shall see. 08/26/2019 on evaluation today patient actually was in the hospital for a period of time from 08/14/2019 through 08/20/2019. Unfortunately he had several episodes of shortness of breath with intermittent  chest pain and altered mental status with decreased level of consciousness and EMS was called. He was evaluated in the hospital where throughout the hospital course he had multiple issues including some trouble with NSVT but no persistent SVT. Cardiology consider cardioversion at one point but then did not. Apparently wound care did see him for his ulcers on his lower extremities although the patient states he really was not impressed with what they were doing. He does have follow-up appointments with cardiology upon discharge as well as continuing with dialysis and seen his primary care provider in 1 to 2 weeks due to his T3 and T4 levels since he did have an elevated TSH in the hospital. His wounds today appear to be doing fairly well and in fact the left stump wounds are dramatically improved compared to when I last saw him. On the right the original wounds seem to be doing slightly better although he does have a new wound on his toe unfortunately. This is the right second toe. 09/02/2019 on evaluation today patient appears to be doing roughly about the same in regard to his wounds in general. He has not really made significant improvements but all in all things appear to be fairly stable which is good news. Unfortunately he was in dialysis when his blood pressure and oxygen saturation dropped recently he ended up  going to the hospital. He was seen in the emergency department where on 08/29/2019 he was told by the physician who was caring for him and his wife was told as well that they should call hospice in as he was not likely to live much longer. Nonetheless that is something that his wife and daughter state they do not want to do obviously under hospice he tells me the biggest issue is he is not allowed to have dialysis which again would lead to his imminent demise. Nonetheless at this point he wants to try it as much as possible enjoy the remaining time that he has with his family which I can respect. In the meantime were trying to manage his wounds as much as we can and as well as we can though again I am not sure how likely he will be to heal everything. 09/09/2019 on evaluation today patient appears to be doing better in my opinion in regard to pre-much all of his wounds on the lower extremity. There does not appear to be any signs of active infection at this time which is good news. No fevers, chills, nausea, vomiting, or diarrhea. Overall I think that things are doing about as well as we can expect at this point. He did have a lot of fluid pulled off today and his right arm is extremely swollen that is uncomfortable for him he is most concerned about that at this time. 09/16/2019 on evaluation today patient appears to be doing well with regard to some of his wounds although he has new wounds in other locations. This includes his right third toe which unfortunately is cracked underneath and appears to be bleeding quite proficiently. We were able to get this stopped today but nonetheless that is definitely not a good sign I am not sure exactly what happened here he really does not know either. He also has new wounds on the gluteal region and the perineum specifically as well as the ischium. Unfortunately these are quite painful at this point. Overall again he seems to be doing better in some regards  and worse than others. 09/30/2019 on evaluation today  patient actually appears to be doing fairly well with regard to his wounds. He has been tolerating the dressing changes without complication. Most of the wounds are measuring at least the same if not a little better across the board and in regard to his toes they seem to be drying up quite a bit and the area underneath where he was bleeding so much on the plantar aspect of his toe actually is not bleeding as much today though I was very cautious not to pull or try to stretch it too much as that obviously would likely start the bleeding all over again. Patient History Unable to Obtain Patient History due to Altered Mental Status. Information obtained from Patient. Family History Diabetes - Father, No family history of Cancer, Heart Disease, Hereditary Spherocytosis, Hypertension, Kidney Disease, Lung Disease, Seizures, Stroke, Thyroid Problems, Tuberculosis. Social History Former smoker, Marital Status - Married, Alcohol Use - Rarely, Drug Use - No History, Caffeine Use - Never. Medical History Eyes Patient has history of Cataracts Denies history of Glaucoma, Optic Neuritis Ear/Nose/Mouth/Throat Denies history of Chronic sinus problems/congestion, Middle ear problems Hematologic/Lymphatic Denies history of Anemia, Hemophilia, Human Immunodeficiency Virus, Lymphedema, Sickle Cell Disease Respiratory Patient has history of Chronic Obstructive Pulmonary Disease (COPD) Denies history of Aspiration, Asthma, Pneumothorax, Sleep Apnea, Tuberculosis Cardiovascular Patient has history of Congestive Heart Failure, Hypertension, Myocardial Infarction, Peripheral Venous Disease Denies history of Angina, Arrhythmia, Coronary Artery Disease, Deep Vein Thrombosis, Hypotension, Peripheral Arterial Disease, Phlebitis, Vasculitis Gastrointestinal Denies history of Cirrhosis , Colitis, Crohnoos, Hepatitis A, Hepatitis B, Hepatitis  C Endocrine Patient has history of Type II Diabetes Denies history of Type I Diabetes Genitourinary Patient has history of End Stage Renal Disease Immunological Denies history of Lupus Erythematosus, Raynaudoos, Scleroderma Integumentary (Skin) Denies history of History of Burn Musculoskeletal Patient has history of Osteomyelitis - left foot Denies history of Gout, Rheumatoid Arthritis, Osteoarthritis Neurologic Denies history of Dementia, Neuropathy, Quadriplegia, Paraplegia, Seizure Disorder Oncologic Denies history of Received Chemotherapy, Received Radiation Psychiatric Denies history of Anorexia/bulimia, Confinement Anxiety Hospitalization/Surgery History - 08/14/2019 [place]- faint/ decreased LOC/ chest pain. Medical And Surgical History Notes Genitourinary dialysis Review of Systems (ROS) Constitutional Symptoms (General Health) Denies complaints or symptoms of Fatigue, Fever, Chills, Marked Weight Change. Respiratory Denies complaints or symptoms of Chronic or frequent coughs, Shortness of Breath. Cardiovascular Denies complaints or symptoms of Chest pain. Psychiatric Denies complaints or symptoms of Claustrophobia, Suicidal. Objective Constitutional Chronically ill appearing but in no apparent acute distress. Vitals Time Taken: 10:55 AM, Height: 71 in, Weight: 209 lbs, BMI: 29.1, Temperature: 97.5 F, Pulse: 55 bpm, Respiratory Rate: 16 breaths/min, Blood Pressure: 83/43 mmHg, Capillary Blood Glucose: 134 mg/dl. General Notes: CBG per patient Respiratory normal breathing without difficulty. clear to auscultation bilaterally. Cardiovascular regular rate and rhythm with normal S1, S2. Psychiatric this patient is able to make decisions and demonstrates good insight into disease process. Alert and Oriented x 3. pleasant and cooperative. General Notes: Upon evaluation today patient's wound bed actually showed signs of good granulation at this time there does not  appear to be any evidence of active infection overall I am fairly pleased with the appearance of most of his wounds. In fact they appear to be either smaller or at least maintaining at this point which really is the main goal. He is aware and we have discussed before that I am not really certain that working to get to the point of healing especially in light of his arterial flow as well as in  light of his overall health condition at this point which is not very good. Integumentary (Hair, Skin) Wound #1 status is Open. Original cause of wound was Gradually Appeared. The wound is located on the Right,Lateral Malleolus. The wound measures 0.8cm length x 0.7cm width x 0.2cm depth; 0.44cm^2 area and 0.088cm^3 volume. There is Fat Layer (Subcutaneous Tissue) Exposed exposed. There is no tunneling or undermining noted. There is a small amount of serosanguineous drainage noted. The wound margin is distinct with the outline attached to the wound base. There is medium (34-66%) pink granulation within the wound bed. There is a medium (34-66%) amount of necrotic tissue within the wound bed including Adherent Slough. Wound #10 status is Open. Original cause of wound was Gradually Appeared. The wound is located on the Right,Plantar Toe Third. The wound measures 0.7cm length x 1.5cm width x 0.2cm depth; 0.825cm^2 area and 0.165cm^3 volume. There is Fat Layer (Subcutaneous Tissue) Exposed exposed. There is no tunneling or undermining noted. There is a medium amount of sanguinous drainage noted. The wound margin is flat and intact. There is large (67-100%) red, friable granulation within the wound bed. There is no necrotic tissue within the wound bed. Wound #4 status is Open. Original cause of wound was Blister. The wound is located on the Right,Medial Lower Leg. The wound measures 1.7cm length x 2.8cm width x 0.1cm depth; 3.738cm^2 area and 0.374cm^3 volume. There is Fat Layer (Subcutaneous Tissue) Exposed  exposed. There is no tunneling or undermining noted. There is a small amount of serosanguineous drainage noted. The wound margin is distinct with the outline attached to the wound base. There is large (67-100%) red granulation within the wound bed. There is a small (1-33%) amount of necrotic tissue within the wound bed including Adherent Slough. Wound #5 status is Open. Original cause of wound was Gradually Appeared. The wound is located on the Amputation Site - Below Knee. The wound measures 0.5cm length x 2cm width x 0.1cm depth; 0.785cm^2 area and 0.079cm^3 volume. There is Fat Layer (Subcutaneous Tissue) Exposed exposed. There is no tunneling or undermining noted. There is a small amount of serosanguineous drainage noted. The wound margin is distinct with the outline attached to the wound base. There is large (67-100%) red granulation within the wound bed. There is no necrotic tissue within the wound bed. Wound #7 status is Open. Original cause of wound was Gradually Appeared. The wound is located on the Right Toe Second. The wound measures 1cm length x 1cm width x 0.1cm depth; 0.785cm^2 area and 0.079cm^3 volume. There is Fat Layer (Subcutaneous Tissue) Exposed exposed. There is no tunneling or undermining noted. There is a small amount of serosanguineous drainage noted. The wound margin is distinct with the outline attached to the wound base. There is small (1-33%) pink granulation within the wound bed. There is a large (67-100%) amount of necrotic tissue within the wound bed including Eschar and Adherent Slough. Wound #8 status is Open. Original cause of wound was Gradually Appeared. The wound is located on the Left Ischium. The wound measures 0.3cm length x 0.7cm width x 0.1cm depth; 0.165cm^2 area and 0.016cm^3 volume. There is Fat Layer (Subcutaneous Tissue) Exposed exposed. There is no tunneling or undermining noted. There is a medium amount of serosanguineous drainage noted. There is  large (67-100%) red granulation within the wound bed. There is a small (1-33%) amount of necrotic tissue within the wound bed including Adherent Slough. Wound #9 status is Open. Original cause of wound was Gradually Appeared.  The wound is located on the Perineum. The wound measures 0.5cm length x 0.4cm width x 0.1cm depth; 0.157cm^2 area and 0.016cm^3 volume. There is Fat Layer (Subcutaneous Tissue) Exposed exposed. There is no tunneling or undermining noted. There is a medium amount of serosanguineous drainage noted. There is medium (34-66%) red granulation within the wound bed. There is a medium (34-66%) amount of necrotic tissue within the wound bed including Adherent Slough. 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 Dressing Change Frequency: Wound #1 Right,Lateral Malleolus: Do not change entire dressing for one week. - all right leg wounds Wound #10 Right,Plantar Toe Third: Change Dressing every other day. Wound #5 Amputation Site - Below Knee: Change Dressing every other day. Wound #7 Right Toe Second: Change Dressing every other day. Wound #8 Left Ischium: Change dressing every day. Wound #9 Perineum: Change dressing every day. Skin Barriers/Peri-Wound Care: TCA Cream or Ointment - to right lower leg Wound Cleansing: Wound #1 Right,Lateral Malleolus: May shower with protection. Wound #5 Amputation Site - Below Knee: Clean wound with Wound Cleanser Wound #7  Right Toe Second: Clean wound with Wound Cleanser Primary Wound Dressing: Wound #1 Right,Lateral Malleolus: Calcium Alginate with Silver Wound #10 Right,Plantar Toe Third: Calcium Alginate with Silver Wound #4 Right,Medial Lower Leg: Calcium Alginate with Silver Wound #5 Amputation Site - Below Knee: Calcium Alginate with Silver Wound #7 Right Toe Second: Calcium Alginate with Silver Wound #8 Left Ischium: Calcium Alginate with Silver Wound #9 Perineum: Other: - Thick layer of zinc oxide paste (barrier paste/cream) Secondary Dressing: Wound #1 Right,Lateral Malleolus: Dry Gauze Wound #10 Right,Plantar Toe Third: Kerlix/Rolled Gauze Dry Gauze Wound #4 Right,Medial Lower Leg: Dry Gauze ABD pad Wound #5 Amputation Site - Below Knee: ABD pad Wound #7 Right Toe Second: Kerlix/Rolled Gauze Dry Gauze Wound #8 Left Ischium: Foam Border - or bordered gauze Edema Control: Kerlix and Coban - Right Lower Extremity - do not wrap too tight Elevate legs to the level of the heart or above for 30 minutes daily and/or when sitting, a frequency of: - throughout the day Other: - stump shrinker or ace wrap to left stump to hold dressing in place Off-Loading: Other: - do not let ankle roll out to the side, no pressure on right lateral ankle, use piece of thick foam with hole cut out for ankle especially at night Home Health: Admit to [facility] 1. I would recommend that we continue with silver alginate to the wound areas at all locations except for the perineum we will do a thick layer of zinc oxide as it is just not a very good area to be able to put a dressing as it is right now. 2. We will do a light Kerlix and Coban to the right lower extremity. 3. We will continue with the stump shrinker to hold the dressing in place for the left distal stump area. 4. I do recommend continued offloading as we discussed previous. We will see patient back for reevaluation in 1 week  here in the clinic. If anything worsens or changes patient will contact  our office for additional recommendations. We are going to attempt to obtain home health for the patient as well. Electronic Signature(s) Signed: 09/30/2019 6:48:11 PM By: Worthy Keeler PA-C Entered By: Worthy Keeler on 09/30/2019 11:50:19 -------------------------------------------------------------------------------- HxROS Details Patient Name: Date of Service: SAHIL, SUM 09/30/2019 10:15 AM Medical Record [id number] Patient Account Number: [id number] Date of Birth/Sex: Treating RN: 12/08/1940 (78 y.o. [name] Primary Care Provider: [name] Other Clinician: Referring Provider: Treating Provider/Extender:[name], [name], [name]: 12 Unable to Obtain Patient History due to Altered Mental Status Information Obtained From Patient Constitutional Symptoms (General Health) Complaints and Symptoms: Negative for: Fatigue; Fever; Chills; Marked Weight Change Respiratory Complaints and Symptoms: Negative for: Chronic or frequent coughs; Shortness of Breath Medical History: Positive for: Chronic Obstructive Pulmonary Disease (COPD) Negative for: Aspiration; Asthma; Pneumothorax; Sleep Apnea; Tuberculosis Cardiovascular Complaints and Symptoms: Negative for: Chest pain Medical History: Positive for: Congestive Heart Failure; Hypertension; Myocardial Infarction; Peripheral Venous Disease Negative for: Angina; Arrhythmia; Coronary Artery Disease; Deep Vein Thrombosis; Hypotension; Peripheral Arterial Disease; Phlebitis; Vasculitis Psychiatric Complaints and Symptoms: Negative for: Claustrophobia; Suicidal Medical History: Negative for: Anorexia/bulimia; Confinement Anxiety Eyes Medical History: Positive for: Cataracts Negative for: Glaucoma; Optic Neuritis Ear/Nose/Mouth/Throat Medical History: Negative for: Chronic sinus problems/congestion; Middle  ear problems Hematologic/Lymphatic Medical History: Negative for: Anemia; Hemophilia; Human Immunodeficiency Virus; Lymphedema; Sickle Cell Disease Gastrointestinal Medical History: Negative for: Cirrhosis ; Colitis; Crohns; Hepatitis A; Hepatitis B; Hepatitis C Endocrine Medical History: Positive for: Type II Diabetes Negative for: Type I Diabetes Time with diabetes: 53 Treated with: Insulin Blood sugar tested every day: No Genitourinary Medical History: Positive for: End Stage Renal Disease Past Medical History Notes: dialysis Immunological Medical History: Negative for: Lupus Erythematosus; Raynauds; Scleroderma Integumentary (Skin) Medical History: Negative for: History of Burn Musculoskeletal Medical History: Positive for: Osteomyelitis - left foot Negative for: Gout; Rheumatoid Arthritis; Osteoarthritis Neurologic Medical History: Negative for: Dementia; Neuropathy; Quadriplegia; Paraplegia; Seizure Disorder Oncologic Medical History: Negative for: Received Chemotherapy; Received Radiation HBO Extended History Items Eyes: Cataracts Immunizations Pneumococcal Vaccine: Received Pneumococcal Vaccination: No Implantable Devices Yes Hospitalization / Surgery History Type of Hospitalization/Surgery 08/14/2019 [place]- faint/ decreased LOC/ chest pain Family and Social History Cancer: No; Diabetes: Yes - Father; Heart Disease: No; Hereditary Spherocytosis: No; Hypertension: No; Kidney Disease: No; Lung Disease: No; Seizures: No; Stroke: No; Thyroid Problems: No; Tuberculosis: No; Former smoker; Marital Status - Married; Alcohol Use: Rarely; Drug Use: No History; Caffeine Use: Never; Financial Concerns: No; Food, Clothing or Shelter Needs: No; Support System Lacking: No; Transportation Concerns: No Physician Affirmation I have reviewed and agree with the above information. Electronic Signature(s) Signed: 09/30/2019 6:42:58 PM By: Baruch Gouty RN, BSN Signed:  09/30/2019 6:48:11 PM By: Worthy Keeler PA-C Entered By: Worthy Keeler on 09/30/2019 11:49:02 -------------------------------------------------------------------------------- SuperBill Details Patient Name: Date of Service: ADYSON, BUZEK 09/30/2019 Medical Record [id number] Patient Account Number: [id number] Date of Birth/Sex: Treating RN: [birth year] (78 y.o. [name] Primary Care Provider: [name] Other Clinician: Referring Provider: Treating Provider/Extender:[name], [name], [name]: 12 Diagnosis Coding ICD-10 Codes Code Description I87.2 Venous insufficiency (chronic) (peripheral) I73.9 Peripheral vascular disease, unspecified L97.312 Non-pressure chronic ulcer of right ankle with fat layer exposed L97.812 Non-pressure chronic ulcer of other part of right lower leg with fat layer exposed Z89.512 Acquired absence of left leg below knee T81.31XA Disruption of external operation (surgical) wound, not elsewhere classified, initial encounter E11.622 Type 2 diabetes mellitus with other skin  ulcer N18.6 End stage renal disease Z99.2 Dependence on renal dialysis Facility Procedures CPT4 Code: [id number] Description: [id number] - WOUND CARE VISIT-LEV 5 EST PT Modifier: Quantity: 1 Physician Procedures CPT4 Code Description: [id number] - WC PHYS LEVEL 4 - EST PT ICD-10 Diagnosis Description I87.2 Venous insufficiency (chronic) (peripheral) I73.9 Peripheral vascular disease, unspecified L97.312 Non-pressure chronic ulcer of right ankle with fat laye  L97.812 Non-pressure chronic ulcer of other part of right lower Modifier: r exposed leg with fat la Quantity: 1 yer exposed Electronic Signature(s) Signed: 09/30/2019 6:42:58 PM By: Baruch Gouty RN, BSN Signed: 09/30/2019 6:48:11 PM By: Worthy Keeler PA-C Entered By: Baruch Gouty on 09/30/2019 11:57:13

## 2019-09-30 NOTE — Progress Notes (Signed)
[name], [name] ([id number]) Visit Report for 09/23/2019 SuperBill Details Patient Name: Date of Service: Jeffrey [name], Jeffrey [name] 09/23/2019 Medical Record [id number] Patient Account Number: [id number] Date of Birth/Sex: Treating RN: 1941/03/20 (77 y.o. [name] Primary Care Provider: [name] Other Clinician: Referring Provider: Treating Provider/Extender:[name], [name], [name]: 11 Diagnosis Coding ICD-10 Codes Code Description I87.2 Venous insufficiency (chronic) (peripheral) I73.9 Peripheral vascular disease, unspecified L97.312 Non-pressure chronic ulcer of right ankle with fat layer exposed L97.812 Non-pressure chronic ulcer of other part of right lower leg with fat layer exposed Z89.512 Acquired absence of left leg below knee T81.31XA Disruption of external operation (surgical) wound, not elsewhere classified, initial encounter E11.622 Type 2 diabetes mellitus with other skin ulcer N18.6 End stage renal disease Z99.2 Dependence on renal dialysis Facility Procedures CPT4 Code Description Modifier Quantity [id number] - WOUND CARE VISIT-LEV 4 EST PT 1 Electronic Signature(s) Signed: 09/23/2019 1:37:03 PM By: Baruch Gouty RN, BSN Signed: 09/30/2019 6:48:11 PM By: Worthy Keeler PA-C Entered By: Baruch Gouty on 09/23/2019 13:35:18

## 2019-10-01 DIAGNOSIS — E8779 Other fluid overload: Secondary | ICD-10-CM | POA: Diagnosis not present

## 2019-10-01 DIAGNOSIS — R197 Diarrhea, unspecified: Secondary | ICD-10-CM | POA: Diagnosis not present

## 2019-10-01 DIAGNOSIS — R52 Pain, unspecified: Secondary | ICD-10-CM | POA: Diagnosis not present

## 2019-10-01 DIAGNOSIS — N2581 Secondary hyperparathyroidism of renal origin: Secondary | ICD-10-CM | POA: Diagnosis not present

## 2019-10-01 DIAGNOSIS — I959 Hypotension, unspecified: Secondary | ICD-10-CM | POA: Diagnosis not present

## 2019-10-01 DIAGNOSIS — N186 End stage renal disease: Secondary | ICD-10-CM | POA: Diagnosis not present

## 2019-10-01 DIAGNOSIS — E0822 Diabetes mellitus due to underlying condition with diabetic chronic kidney disease: Secondary | ICD-10-CM | POA: Diagnosis not present

## 2019-10-01 DIAGNOSIS — Z87891 Personal history of nicotine dependence: Secondary | ICD-10-CM | POA: Diagnosis not present

## 2019-10-01 DIAGNOSIS — E8770 Fluid overload, unspecified: Secondary | ICD-10-CM | POA: Diagnosis not present

## 2019-10-01 DIAGNOSIS — D631 Anemia in chronic kidney disease: Secondary | ICD-10-CM | POA: Diagnosis not present

## 2019-10-01 DIAGNOSIS — J449 Chronic obstructive pulmonary disease, unspecified: Secondary | ICD-10-CM | POA: Diagnosis not present

## 2019-10-01 DIAGNOSIS — Z992 Dependence on renal dialysis: Secondary | ICD-10-CM | POA: Diagnosis not present

## 2019-10-01 DIAGNOSIS — D509 Iron deficiency anemia, unspecified: Secondary | ICD-10-CM | POA: Diagnosis not present

## 2019-10-02 DIAGNOSIS — T8131XA Disruption of external operation (surgical) wound, not elsewhere classified, initial encounter: Secondary | ICD-10-CM | POA: Diagnosis not present

## 2019-10-02 DIAGNOSIS — E11622 Type 2 diabetes mellitus with other skin ulcer: Secondary | ICD-10-CM | POA: Diagnosis not present

## 2019-10-03 DIAGNOSIS — Z992 Dependence on renal dialysis: Secondary | ICD-10-CM | POA: Diagnosis not present

## 2019-10-03 DIAGNOSIS — D509 Iron deficiency anemia, unspecified: Secondary | ICD-10-CM | POA: Diagnosis not present

## 2019-10-03 DIAGNOSIS — N186 End stage renal disease: Secondary | ICD-10-CM | POA: Diagnosis not present

## 2019-10-03 DIAGNOSIS — J449 Chronic obstructive pulmonary disease, unspecified: Secondary | ICD-10-CM | POA: Diagnosis not present

## 2019-10-03 DIAGNOSIS — D631 Anemia in chronic kidney disease: Secondary | ICD-10-CM | POA: Diagnosis not present

## 2019-10-03 DIAGNOSIS — R197 Diarrhea, unspecified: Secondary | ICD-10-CM | POA: Diagnosis not present

## 2019-10-03 DIAGNOSIS — N2581 Secondary hyperparathyroidism of renal origin: Secondary | ICD-10-CM | POA: Diagnosis not present

## 2019-10-03 DIAGNOSIS — E8770 Fluid overload, unspecified: Secondary | ICD-10-CM | POA: Diagnosis not present

## 2019-10-03 DIAGNOSIS — Z87891 Personal history of nicotine dependence: Secondary | ICD-10-CM | POA: Diagnosis not present

## 2019-10-03 DIAGNOSIS — E8779 Other fluid overload: Secondary | ICD-10-CM | POA: Diagnosis not present

## 2019-10-03 DIAGNOSIS — R52 Pain, unspecified: Secondary | ICD-10-CM | POA: Diagnosis not present

## 2019-10-03 DIAGNOSIS — I959 Hypotension, unspecified: Secondary | ICD-10-CM | POA: Diagnosis not present

## 2019-10-03 DIAGNOSIS — E0822 Diabetes mellitus due to underlying condition with diabetic chronic kidney disease: Secondary | ICD-10-CM | POA: Diagnosis not present

## 2019-10-05 DIAGNOSIS — J449 Chronic obstructive pulmonary disease, unspecified: Secondary | ICD-10-CM | POA: Diagnosis not present

## 2019-10-05 DIAGNOSIS — Z87891 Personal history of nicotine dependence: Secondary | ICD-10-CM | POA: Diagnosis not present

## 2019-10-05 DIAGNOSIS — R52 Pain, unspecified: Secondary | ICD-10-CM | POA: Diagnosis not present

## 2019-10-05 DIAGNOSIS — E0822 Diabetes mellitus due to underlying condition with diabetic chronic kidney disease: Secondary | ICD-10-CM | POA: Diagnosis not present

## 2019-10-05 DIAGNOSIS — T8131XA Disruption of external operation (surgical) wound, not elsewhere classified, initial encounter: Secondary | ICD-10-CM | POA: Diagnosis not present

## 2019-10-05 DIAGNOSIS — E8770 Fluid overload, unspecified: Secondary | ICD-10-CM | POA: Diagnosis not present

## 2019-10-05 DIAGNOSIS — N186 End stage renal disease: Secondary | ICD-10-CM | POA: Diagnosis not present

## 2019-10-05 DIAGNOSIS — N2581 Secondary hyperparathyroidism of renal origin: Secondary | ICD-10-CM | POA: Diagnosis not present

## 2019-10-05 DIAGNOSIS — I959 Hypotension, unspecified: Secondary | ICD-10-CM | POA: Diagnosis not present

## 2019-10-05 DIAGNOSIS — D509 Iron deficiency anemia, unspecified: Secondary | ICD-10-CM | POA: Diagnosis not present

## 2019-10-05 DIAGNOSIS — R197 Diarrhea, unspecified: Secondary | ICD-10-CM | POA: Diagnosis not present

## 2019-10-05 DIAGNOSIS — E8779 Other fluid overload: Secondary | ICD-10-CM | POA: Diagnosis not present

## 2019-10-05 DIAGNOSIS — Z992 Dependence on renal dialysis: Secondary | ICD-10-CM | POA: Diagnosis not present

## 2019-10-05 DIAGNOSIS — D631 Anemia in chronic kidney disease: Secondary | ICD-10-CM | POA: Diagnosis not present

## 2019-10-05 DIAGNOSIS — E11622 Type 2 diabetes mellitus with other skin ulcer: Secondary | ICD-10-CM | POA: Diagnosis not present

## 2019-10-05 NOTE — Progress Notes (Signed)
Jeffrey [name], Jeffrey [name] ([id number]) Visit Report for 09/30/2019 Arrival Information Details Patient Name: Date of Service: Jeffrey [name], Jeffrey [name] 09/30/2019 10:15 AM Medical Record [name] Patient Account Number: [id number] Date of Birth/Sex: Treating RN: 1941-10-29 (78 y.o. Jeffrey [name] Primary Care [place]: [name] Other Clinician: Referring [name]: Treating Patsy Varma/Extender:[name], [name], [name]: 12 Visit Information History Since Last Visit Added or deleted any medications: No Patient Arrived: Wheel Chair Any new allergies or adverse reactions: No Arrival Time: 10:47 Had a fall or experienced change in No Accompanied By: son activities of daily living that may affect Transfer Assistance: None risk of falls: Patient Identification Verified: Yes Signs or symptoms of abuse/neglect since last No Secondary Verification Process Yes visito Completed: Hospitalized since last visit: No Patient Requires Transmission- No Implantable device outside of the clinic excluding No Based Precautions: cellular tissue based products placed in the center Patient Has Alerts: Yes since last visit: Patient Alerts: Patient on Blood Pain Present Now: Yes Thinner R ABI = .44, R TBI= .43 Electronic Signature(s) Signed: 10/05/2019 5:46:53 PM By: [name] RN, BSN Entered By: [name] on 09/30/2019 10:47:54 -------------------------------------------------------------------------------- Clinic Level of Care Assessment Details Patient Name: Date of Service: Jeffrey [name] 09/30/2019 10:15 AM Medical Record [name] Patient Account Number: [id number] Date of Birth/Sex: Treating RN: 14-Apr-1941 (78 y.o. Jeffrey [name] Primary Care Theola Cuellar: [name] Other Clinician: Referring [name]: Treating Hollie Bartus/Extender:[name], [name], [name]: 12 Clinic Level of Care Assessment Items TOOL 4  Quantity Score '[]'  - Use when only an EandM is performed on FOLLOW-UP visit 0 ASSESSMENTS - Nursing Assessment / Reassessment X - Reassessment of Co-morbidities (includes updates in patient status) 1 10 X - Reassessment of Adherence to Treatment Plan 1 5 ASSESSMENTS - Wound and Skin Assessment / Reassessment '[]'  - Simple Wound Assessment / Reassessment - one wound 0 X - Complex Wound Assessment / Reassessment - multiple wounds 7 5 '[]'  - Dermatologic / Skin Assessment (not related to wound area) 0 ASSESSMENTS - Focused Assessment X - Circumferential Edema Measurements - multi extremities 1 5 '[]'  - Nutritional Assessment / Counseling / Intervention 0 X - Lower Extremity Assessment (monofilament, tuning fork, pulses) 1 5 '[]'  - Peripheral Arterial Disease Assessment (using hand held doppler) 0 ASSESSMENTS - Ostomy and/or Continence Assessment and Care '[]'  - Incontinence Assessment and Management 0 '[]'  - Ostomy Care Assessment and Management (repouching, etc.) 0 PROCESS - Coordination of Care X - Simple Patient / Family Education for ongoing care 1 15 '[]'  - Complex (extensive) Patient / Family Education for ongoing care 0 X - Staff obtains Programmer, systems, Records, Test Results / Process Orders 1 10 X - Staff telephones HHA, Nursing Homes / Clarify orders / etc 1 10 '[]'  - Routine Transfer to another Facility (non-emergent condition) 0 '[]'  - Routine Hospital Admission (non-emergent condition) 0 '[]'  - New Admissions / Biomedical engineer / Ordering NPWT, Apligraf, etc. 0 '[]'  - Emergency Hospital Admission (emergent condition) 0 X - Simple Discharge Coordination 1 10 '[]'  - Complex (extensive) Discharge Coordination 0 PROCESS - Special Needs '[]'  - Pediatric / Minor Patient Management 0 '[]'  - Isolation Patient Management 0 '[]'  - Hearing / Language / Visual special needs 0 '[]'  - Assessment of Community assistance (transportation, D/C planning, etc.) 0 '[]'  - Additional assistance / Altered mentation 0 '[]'  - Support  Surface(s) Assessment (bed, cushion, seat, etc.) 0 INTERVENTIONS - Wound Cleansing / Measurement '[]'  - Simple Wound Cleansing - one wound 0 X -  Complex Wound Cleansing - multiple wounds 7 5 X - Wound Imaging (photographs - any number of wounds) 1 5 '[]'  - Wound Tracing (instead of photographs) 0 '[]'  - Simple Wound Measurement - one wound 0 X - Complex Wound Measurement - multiple wounds 7 5 INTERVENTIONS - Wound Dressings X - Small Wound Dressing one or multiple wounds 7 10 '[]'  - Medium Wound Dressing one or multiple wounds 0 '[]'  - Large Wound Dressing one or multiple wounds 0 X - Application of Medications - topical 1 5 '[]'  - Application of Medications - injection 0 INTERVENTIONS - Miscellaneous '[]'  - External ear exam 0 '[]'  - Specimen Collection (cultures, biopsies, blood, body fluids, etc.) 0 '[]'  - Specimen(s) / Culture(s) sent or taken to Lab for analysis 0 '[]'  - Patient Transfer (multiple staff / Civil Service fast streamer / Similar devices) 0 '[]'  - Simple Staple / Suture removal (25 or less) 0 '[]'  - Complex Staple / Suture removal (26 or more) 0 '[]'  - Hypo / Hyperglycemic Management (close monitor of Blood Glucose) 0 '[]'  - Ankle / Brachial Index (ABI) - do not check if billed separately 0 X - Vital Signs 1 5 Has the patient been seen at the hospital within the last three years: Yes Total Score: 260 Level Of Care: New/Established - Level 5 Electronic Signature(s) Signed: 09/30/2019 6:42:58 PM By: Baruch Gouty RN, BSN Entered By: Baruch Gouty on 09/30/2019 11:56:25 -------------------------------------------------------------------------------- Encounter Discharge Information Details Patient Name: Date of Service: Jeffrey [name] 09/30/2019 10:15 AM Medical Record [id number] Patient Account Number: [id number] Date of Birth/Sex: Treating RN: 1941-09-10 (78 y.o. Jeffrey [name] Primary Care [name]: [name] Other Clinician: Referring [name]: Treating [name]/Extender:[name],  [name], [name]: 12 Encounter Discharge Information Items Discharge Condition: Stable Ambulatory Status: Wheelchair Discharge Destination: Home Transportation: Private Auto Accompanied By: son Schedule Follow-up Appointment: Yes Clinical Summary of Care: Electronic Signature(s) Signed: 09/30/2019 6:30:51 PM By: Deon Pilling Entered By: Deon Pilling on 09/30/2019 15:13:02 -------------------------------------------------------------------------------- Lower Extremity Assessment Details Patient Name: Date of Service: Jeffrey [name], Jeffrey [name] 09/30/2019 10:15 AM Medical Record [id number] Patient Account Number: [id number] Date of Birth/Sex: Treating RN: 03-26-1941 (78 y.o. Jeffrey [name] Primary Care Nazia Rhines: [name] Other Clinician: Referring Nataya Bastedo: Treating Darilyn Storbeck/Extender:[name], [name], [name]: 12 Edema Assessment Assessed: [Left: No] [Right: No] Edema: [Left: Ye] [Right: s] Calf Left: Right: Point of Measurement: 27 cm From Medial Instep cm 41 cm Ankle Left: Right: Point of Measurement: 10 cm From Medial Instep cm 23 cm Electronic Signature(s) Signed: 10/05/2019 5:46:53 PM By: [name] RN, BSN Entered By: [name] on 09/30/2019 10:59:00 -------------------------------------------------------------------------------- [place] Details Patient Name: Date of Service: Jeffrey [name] 09/30/2019 10:15 AM Medical Record [id number] Patient Account Number: [id number] Date of Birth/Sex: Treating RN: 12-Sep-1941 (78 y.o. Jeffrey [name] Primary Care Dru Laurel: [name] Other Clinician: Referring Monchel Pollitt: Treating Gergory Biello/Extender:[name], [name], [name]: 12 Active Inactive Wound/Skin Impairment Nursing Diagnoses: Impaired tissue integrity Knowledge deficit related to ulceration/compromised skin  integrity Goals: Patient/caregiver will verbalize understanding of skin care regimen Date Initiated: 07/08/2019 Target Resolution Date: 10/28/2019 Goal Status: Active Ulcer/skin breakdown will have a volume reduction of 30% by week 4 Date Initiated: 07/08/2019 Date Inactivated: 08/05/2019 Target Resolution Date: 08/05/2019 Goal Status: Met Ulcer/skin breakdown will have a volume reduction of 50% by week 8 Date Initiated: 08/05/2019 Date Inactivated: 09/02/2019 Target Resolution Date: 09/04/2019 Unmet Reason: multiple Goal Status: Unmet comorbidities Ulcer/skin breakdown will have a volume reduction  of 80% by week 12 Date Initiated: 09/02/2019 Date Inactivated: 09/30/2019 Target Resolution Date: 09/30/2019 Unmet Reason: multiple Goal Status: Unmet comorbidities Interventions: Assess patient/caregiver ability to obtain necessary supplies Assess patient/caregiver ability to perform ulcer/skin care regimen upon admission and as needed Assess ulceration(s) every visit Treatment Activities: Skin care regimen initiated : 07/08/2019 Topical wound management initiated : 07/08/2019 Notes: Electronic Signature(s) Signed: 09/30/2019 6:42:58 PM By: Baruch Gouty RN, BSN Entered By: Baruch Gouty on 09/30/2019 11:37:40 -------------------------------------------------------------------------------- Pain Assessment Details Patient Name: Date of Service: Jeffrey [name] 09/30/2019 10:15 AM Medical Record [id number] Patient Account Number: [id number] Date of Birth/Sex: Treating RN: [birth year] (78 y.o. Jeffrey [name] Primary Care Antonis Lor: [name] Other Clinician: Referring Trellis Guirguis: Treating Lavina Resor/Extender:[name], [name], [name]: 12 Active Problems Location of Pain Severity and Description of Pain Patient Has Paino Yes Site Locations Pain Location: Pain in Ulcers With Dressing Change: Yes Duration of the Pain. Constant / Intermittento  Intermittent Rate the pain. Current Pain Level: 4 Character of Pain Describe the Pain: Dull Pain Management and Medication Current Pain Management: Medication: No Cold Application: No Rest: No Massage: No Activity: No T.E.N.S.: No Heat Application: No Leg drop or elevation: No Is the Current Pain Management Adequate: Adequate How does your wound impact your activities of daily livingo Sleep: No Bathing: No Appetite: No Relationship With Others: No Bladder Continence: No Emotions: No Bowel Continence: No Work: No Toileting: No Drive: No Dressing: No Hobbies: No Electronic Signature(s) Signed: 10/05/2019 5:46:53 PM By: [name] RN, BSN Entered By: [name] on 09/30/2019 10:58:32 -------------------------------------------------------------------------------- Patient/Caregiver Education Details Patient Name: Jeffrey [name] 11/4/2020andnbsp10:15 Date of Service: AM Medical Record [id number] Number: Patient Account Number: [id number] Treating RN: [birth year] (78 y.o. Baruch Gouty Date of Birth/Gender: M) Other Clinician: Primary Care Physician: [name] Treating Worthy Keeler Referring Physician: Physician/Extender: Clearence Cheek in Treatment: 12 Education Assessment Education Provided To: Patient Education Topics Provided Pressure: Methods: Explain/Verbal Responses: Reinforcements needed, State content correctly Wound/Skin Impairment: Methods: Explain/Verbal Responses: Reinforcements needed, State content correctly Electronic Signature(s) Signed: 09/30/2019 6:42:58 PM By: Baruch Gouty RN, BSN Entered By: Baruch Gouty on 09/30/2019 11:38:13 -------------------------------------------------------------------------------- Wound Assessment Details Patient Name: Date of Service: KIMM, UNGARO 09/30/2019 10:15 AM Medical Record [id number] Patient Account Number: [id number] Date of Birth/Sex: Treating  RN: 09-15-1941 (78 y.o. Jeffrey [name] Primary Care Sina Sumpter: [name] Other Clinician: Referring Gissella Niblack: Treating Roshanna Cimino/Extender:[name], [name], [name]: 12 Wound Status Wound Number: 1 Primary Venous Leg Ulcer Etiology: Wound Location: Right Malleolus - Lateral Wound Open Wounding Event: Gradually Appeared Status: Date Acquired: 06/26/2019 Comorbid Cataracts, Chronic Obstructive Pulmonary Weeks Of Treatment: 12 History: Disease (COPD), Congestive Heart Failure, Clustered Wound: No Hypertension, Myocardial Infarction, Peripheral Venous Disease, Type II Diabetes, End Stage Renal Disease, Osteomyelitis Photos Wound Measurements Length: (cm) 0.8 % Reduct Width: (cm) 0.7 % Reduct Depth: (cm) 0.2 Epitheli Area: (cm) 0.44 Tunneli Volume: (cm) 0.088 Undermi Wound Description Full Thickness Without Exposed Support Foul Od Classification: Structures Slough/ Wound Distinct, outline attached Margin: Exudate Small Amount: Exudate Serosanguineous Type: Exudate red, brown Color: Wound Bed Granulation Amount: Medium (34-66%) Granulation Quality: Pink Fascia E Necrotic Amount: Medium (34-66%) Fat Laye Necrotic Quality: Adherent Slough Tendon E Muscle E Joint Ex Bone Exp or After Cleansing: No Fibrino Yes Exposed Structure xposed: No r (Subcutaneous Tissue) Exposed: Yes xposed: No xposed: No posed: No osed: No ion in Area: -24.6% ion in Volume: -151.4% alization: None ng: No ning: No Treatment  Notes Wound #1 (Right, Lateral Malleolus) 1. Cleanse With Wound Cleanser Soap and water 2. Periwound Care TCA Ointment 3. Primary Dressing Applied Calcium Alginate Ag 4. Secondary Dressing Dry Gauze 6. Support Layer Holiday representative) Signed: 10/05/2019 4:06:15 PM By: Mikeal Hawthorne EMT/HBOT Signed: 10/05/2019 5:46:53 PM By: [name] RN, BSN Entered By: Mikeal Hawthorne on 10/05/2019  09:14:07 -------------------------------------------------------------------------------- Wound Assessment Details Patient Name: Date of Service: Jeffrey [name], Jeffrey [name] 09/30/2019 10:15 AM Medical Record [id number] Patient Account Number: [id number] Date of Birth/Sex: Treating RN: Jan 14, 1941 (78 y.o. Jeffrey [name] Primary Care Jerriyah Louis: [name] Other Clinician: Referring Lafe Clerk: Treating Shell Yandow/Extender:[name], [name], [name]: 12 Wound Status Wound Number: 10 Primary Diabetic Wound/Ulcer of the Lower Extremity Etiology: Wound Location: Right Toe Third - Plantar Wound Open Wounding Event: Gradually Appeared Status: Date Acquired: 09/16/2019 Comorbid Cataracts, Chronic Obstructive Pulmonary Weeks Of Treatment: 2 History: Disease (COPD), Congestive Heart Failure, Clustered Wound: No Hypertension, Myocardial Infarction, Peripheral Venous Disease, Type II Diabetes, End Stage Renal Disease, Osteomyelitis Photos Wound Measurements Length: (cm) 0.7 Width: (cm) 1.5 Depth: (cm) 0.2 Area: (cm) 0.825 Volume: (cm) 0.165 Wound Description Classification: Grade 1 Wound Margin: Flat and Intact Exudate Amount: Medium Exudate Type: Sanguinous Exudate Color: red Wound Bed Granulation Amount: Large (67-100%) Granulation Quality: Red, Friable Necrotic Amount: None Present (0%) After Cleansing: No brino No Exposed Structure osed: No (Subcutaneous Tissue) Exposed: Yes osed: No osed: No ed: No d: No % Reduction in Area: -425.5% % Reduction in Volume: -432.3% Epithelialization: None Tunneling: No Undermining: No Foul Odor Slough/Fi Fascia Exp Fat Layer Tendon Exp Muscle Exp Joint Expos Bone Expose Treatment Notes Wound #10 (Right, Plantar Toe Third) 1. Cleanse With Wound Cleanser Soap and water 3. Primary Dressing Applied Calcium Alginate Ag 4. Secondary Dressing Dry Gauze Roll Gauze 5. Secured With [employer]) Signed: 10/05/2019 4:06:15 PM By: Mikeal Hawthorne EMT/HBOT Signed: 10/05/2019 5:46:53 PM By: [name] RN, BSN Entered By: Mikeal Hawthorne on 10/05/2019 09:13:11 -------------------------------------------------------------------------------- Wound Assessment Details Patient Name: Date of Service: Jeffrey [name], Jeffrey [name] 09/30/2019 10:15 AM Medical Record [id number] Patient Account Number: [id number] Date of Birth/Sex: Treating RN: October 20, 1941 (78 y.o. Jeffrey [name] Primary Care Argel Pablo: [name] Other Clinician: Referring Steve Youngberg: Treating Carita Sollars/Extender:[name], [name], [name]: 12 Wound Status Wound Number: 4 Primary Venous Leg Ulcer Etiology: Wound Location: Right Lower Leg - Medial Wound Open Wounding Event: Blister Status: Date Acquired: 07/10/2019 Comorbid Cataracts, Chronic Obstructive Pulmonary Weeks Of Treatment: 11 History: Disease (COPD), Congestive Heart Failure, Clustered Wound: No Hypertension, Myocardial Infarction, Peripheral Venous Disease, Type II Diabetes, End Stage Renal Disease, Osteomyelitis Photos Wound Measurements Length: (cm) 1.7 % Reduct Width: (cm) 2.8 % Reduct Depth: (cm) 0.1 Epitheli Area: (cm) 3.738 Tunneli Volume: (cm) 0.374 Undermi Wound Description Classification: Full Thickness Without Exposed Support Foul Od Structures Slough/ Wound Distinct, outline attached Margin: Exudate Small Amount: Exudate Serosanguineous Type: Exudate red, brown Color: Wound Bed Granulation Amount: Large (67-100%) Granulation Quality: Red Fascia Necrotic Amount: Small (1-33%) Fat Lay Necrotic Quality: Adherent Slough Tendon Muscle Joint E Bone Ex or After Cleansing: No Fibrino Yes Exposed Structure Exposed: No er (Subcutaneous Tissue) Exposed: Yes Exposed: No Exposed: No xposed: No posed: No ion in Area: 41.2% ion in Volume: 41.2% alization: Small (1-33%) ng:  No ning: No Treatment Notes Wound #4 (Right, Medial Lower Leg) 1. Cleanse With Wound Cleanser Soap and water 2. Periwound Care TCA Ointment 3. Primary Dressing Applied Calcium Alginate Ag 4. Secondary  Dressing Dry Gauze 6. Support Layer Holiday representative) Signed: 10/05/2019 4:06:15 PM By: Mikeal Hawthorne EMT/HBOT Signed: 10/05/2019 5:46:53 PM By: [name] RN, BSN Entered By: Mikeal Hawthorne on 10/05/2019 09:12:26 -------------------------------------------------------------------------------- Wound Assessment Details Patient Name: Date of Service: JABORI, HENEGAR 09/30/2019 10:15 AM Medical Record [id number] Patient Account Number: [id number] Date of Birth/Sex: Treating RN: 13-Mar-1941 (78 y.o. 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 Secondary Inflammatory Wounding Event: Gradually Appeared Etiology: Date Acquired: 08/12/2019 Wound Open Weeks Of Treatment: 7 Status: Clustered Wound: Yes Comorbid Cataracts, Chronic Obstructive Pulmonary History: Disease (COPD), Congestive Heart Failure, Hypertension, Myocardial Infarction, Peripheral Venous Disease, Type II Diabetes, End Stage Renal Disease, Osteomyelitis Photos Wound Measurements Length: (cm) 0.5 % Reduction i Width: (cm) 2 % Reduction i Depth: (cm) 0.1 Epithelializa Clustered Quantity: 2 Tunneling: Area: (cm) 0.785 Undermining: Volume: (cm) 0.079 Wound Description Classification: Partial Thickness Wound Margin: Distinct, outline attached Exudate Amount: Small Exudate Type: Serosanguineous Exudate Color: red, brown Wound Bed Granulation Amount: Large (67-100%) Granulation Quality: Red Necrotic Amount: None Present (0%) Foul  Odor After Cleansing: No Slough/Fibrino No Exposed Structure Fascia Exposed: No Fat Layer (Subcutaneous Tissue) Exposed: Yes Tendon Exposed: No Muscle Exposed: No Joint Exposed: No Bone Exposed: No n Area: 91.7% n Volume: 91.6% tion: Medium (34-66%) No No Treatment Notes 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 Cleanse With Wound Cleanser 3. Primary Dressing Applied Calcium Alginate Ag 4. Secondary Dressing ABD Pad Notes shrinker Electronic Signature(s) Signed: 10/05/2019 4:06:15 PM By: Mikeal Hawthorne EMT/HBOT Signed: 10/05/2019 5:46:53 PM By: [name] RN, BSN Entered By: Mikeal Hawthorne on 10/05/2019 09:13:33 -------------------------------------------------------------------------------- Wound Assessment Details Patient Name: Date of Service: Jeffrey [name], Jeffrey [name] 09/30/2019 10:15 AM Medical Record [id number] Patient Account Number: [id number] Date of Birth/Sex: Treating RN: 1941-08-04 (78 y.o. Jeffrey [name] Primary Care Glendon Dunwoody: [name] Other Clinician: Referring Luvern Mischke: Treating Kijana Estock/Extender:[name], [name], [name]: 12 Wound Status Wound Number: 7 Primary Diabetic Wound/Ulcer of the Lower Extremity Etiology: Wound Location: Right Toe Second Wound Open Wounding Event: Gradually Appeared Status: Date Acquired: 08/14/2019 Comorbid Cataracts, Chronic Obstructive Pulmonary Weeks Of Treatment: 5 History: Disease (COPD), Congestive Heart Failure, Clustered Wound: No Hypertension, Myocardial Infarction, Peripheral Venous Disease, Type II Diabetes, End Stage Renal Disease, Osteomyelitis Photos Wound Measurements Length: (cm) 1 Width: (cm) 1 Depth: (cm) 0.1 Area: (cm) 0.785 Volume: (cm) 0.079 Wound Description Classification: Grade 2 Wound Margin: Distinct, outline attached Exudate Amount: Small Exudate Type: Serosanguineous Exudate Color: red, brown Wound Bed Granulation Amount: Small  (1-33%) Granulation Quality: Pink Necrotic Amount: Large (67-100%) Necrotic Quality: Eschar, Adherent Slough r After Cleansing: No ibrino Yes Exposed Structure sed: No Subcutaneous Tissue) Exposed: Yes sed: No sed: No ed: No d: No % Reduction in Area: -11% % Reduction in Volume: 44% Epithelialization: None Tunneling: No Undermining: No Foul Odo Slough/F Fascia Expo Fat Layer ( Tendon Expo Muscle Expo Joint Expos Bone Expose Treatment Notes Wound #7 (Right Toe Second) 1. Cleanse With Wound Cleanser Soap and water 3. Primary Dressing Applied Calcium Alginate Ag 4. Secondary Dressing Dry Gauze Roll Gauze 5. Secured With [employer]) Signed: 10/05/2019 4:06:15 PM By: Mikeal Hawthorne EMT/HBOT Signed: 10/05/2019 5:46:53 PM By: [name] RN, BSN Entered By: Mikeal Hawthorne on 10/05/2019 09:12:46 -------------------------------------------------------------------------------- Wound Assessment Details Patient Name: Date of Service: Jeffrey [name],  Jeffrey [name] 09/30/2019 10:15 AM Medical Record [id number] Patient Account Number: [id number] Date of Birth/Sex: Treating RN: 12/31/1940 (78 y.o. Jeffrey [name] Primary Care Torien Ramroop: [name] Other Clinician: Referring Khamia Stambaugh: Treating Audwin Semper/Extender:[name], [name], [name]: 12 Wound Status Wound Number: 8 Primary Pressure Ulcer Etiology: Wound Location: Left Ischium Wound Open Wounding Event: Gradually Appeared Status: Date Acquired: 09/16/2019 Comorbid Cataracts, Chronic Obstructive Pulmonary Weeks Of Treatment: 2 History: Disease (COPD), Congestive Heart Failure, Clustered Wound: No Hypertension, Myocardial Infarction, Peripheral Venous Disease, Type II Diabetes, End Stage Renal Disease, Osteomyelitis Photos Wound Measurements Length: (cm) 0.3 Width: (cm) 0.7 Depth: (cm) 0.1 Area: (cm) 0.165 Volume: (cm) 0.016 Wound  Description Classification: Category/Stage II Exudate Amount: Medium Exudate Type: Serosanguineous Exudate Color: red, brown Wound Bed Granulation Amount: Large (67-100%) Granulation Quality: Red Necrotic Amount: Small (1-33%) Necrotic Quality: Adherent Slough fter Cleansing: No ino No Exposed Structure ed: No ubcutaneous Tissue) Exposed: Yes ed: No ed: No d: No : No % Reduction in Area: 90.7% % Reduction in Volume: 91% Epithelialization: None Tunneling: No Undermining: No Foul Odor A Slough/Fibr Fascia Expos Fat Layer (S Tendon Expos Muscle Expos Joint Expose Bone Exposed Treatment Notes Wound #8 (Left Ischium) 1. Cleanse With Wound Cleanser 2. Periwound Care Skin Prep 3. Primary Dressing Applied Calcium Alginate Ag 4. Secondary Dressing Foam Border Dressing 5. Secured With [employer]) Signed: 10/05/2019 4:06:15 PM By: Mikeal Hawthorne EMT/HBOT Signed: 10/05/2019 5:46:53 PM By: [name] RN, BSN Entered By: Mikeal Hawthorne on 10/05/2019 09:12:02 -------------------------------------------------------------------------------- Wound Assessment Details Patient Name: Date of Service: Jeffrey [name], Jeffrey [name] 09/30/2019 10:15 AM Medical Record [id number] Patient Account Number: [id number] Date of Birth/Sex: Treating RN: Jan 11, 1941 (78 y.o. Jeffrey [name] Primary Care Samanth Mirkin: [name] Other Clinician: Referring Riven Mabile: Treating Kingdom Vanzanten/Extender:[name], [name], [name]: 12 Wound Status Wound Number: 9 Primary Pressure Ulcer Etiology: Wound Location: Perineum Wound Open Wounding Event: Gradually Appeared Status: Date Acquired: 09/16/2019 Comorbid Cataracts, Chronic Obstructive Pulmonary Weeks Of Treatment: 2 History: Disease (COPD), Congestive Heart Failure, Clustered Wound: No Hypertension, Myocardial Infarction, Peripheral Venous Disease, Type II Diabetes, End Stage Renal  Disease, Osteomyelitis Photos Wound Measurements Length: (cm) 0.5 Width: (cm) 0.4 Depth: (cm) 0.1 Area: (cm) 0.157 Volume: (cm) 0.016 Wound Description Classification: Category/Stage II Exudate Amount: Medium Exudate Type: Serosanguineous Exudate Color: red, brown Wound Bed Granulation Amount: Medium (34-66%) Granulation Quality: Red Necrotic Amount: Medium (34-66%) Necrotic Quality: Adherent Slough After Cleansing: No rino Yes Exposed Structure osed: No (Subcutaneous Tissue) Exposed: Yes osed: No osed: No sed: No ed: No % Reduction in Area: 95.6% % Reduction in Volume: 98.5% Epithelialization: None Tunneling: No Undermining: No Foul Odor Slough/Fib Fascia Exp Fat Layer Tendon Exp Muscle Exp Joint Expo Bone Expos Treatment Notes Wound #9 (Perineum) 1. Cleanse With Wound Cleanser 3. Primary Dressing Applied Other primary dressing (specifiy in notes) Notes primary dressing zinc oxide. Electronic Signature(s) Signed: 10/05/2019 4:06:15 PM By: Mikeal Hawthorne EMT/HBOT Signed: 10/05/2019 5:46:53 PM By: [name] RN, BSN Entered By: Mikeal Hawthorne on 10/05/2019 09:04:16 -------------------------------------------------------------------------------- [place] Details Patient Name: Date of Service: Jeffrey [name], Jeffrey [name] 09/30/2019 10:15 AM Medical Record [id number] Patient Account Number: [id number] Date of Birth/Sex: Treating RN: 1941/10/19 (78 y.o. Jeffrey [name] Primary Care Jamyson Jirak: [name] Other Clinician: Referring Railynn Ballo: Treating Delbra Zellars/Extender:[name], [name], [name]: 12 Vital Signs Time Taken: 10:55 Temperature (F): 97.5 Height (in): 71 Pulse (bpm): 55 Weight (lbs): 209 Respiratory Rate (breaths/min): 16 Body Mass Index (BMI): 29.1  Blood Pressure (mmHg): 83/43 Capillary Blood Glucose (mg/dl): 134 Reference Range: 80 - 120 mg / dl Notes CBG per patient Electronic Signature(s) Signed:  10/05/2019 5:46:53 PM By: [name] RN, BSN Entered By: [name] on 09/30/2019 10:56:13

## 2019-10-06 DIAGNOSIS — D631 Anemia in chronic kidney disease: Secondary | ICD-10-CM | POA: Diagnosis not present

## 2019-10-06 DIAGNOSIS — E0822 Diabetes mellitus due to underlying condition with diabetic chronic kidney disease: Secondary | ICD-10-CM | POA: Diagnosis not present

## 2019-10-06 DIAGNOSIS — Z87891 Personal history of nicotine dependence: Secondary | ICD-10-CM | POA: Diagnosis not present

## 2019-10-06 DIAGNOSIS — E8770 Fluid overload, unspecified: Secondary | ICD-10-CM | POA: Diagnosis not present

## 2019-10-06 DIAGNOSIS — R197 Diarrhea, unspecified: Secondary | ICD-10-CM | POA: Diagnosis not present

## 2019-10-06 DIAGNOSIS — E8779 Other fluid overload: Secondary | ICD-10-CM | POA: Diagnosis not present

## 2019-10-06 DIAGNOSIS — R52 Pain, unspecified: Secondary | ICD-10-CM | POA: Diagnosis not present

## 2019-10-06 DIAGNOSIS — J449 Chronic obstructive pulmonary disease, unspecified: Secondary | ICD-10-CM | POA: Diagnosis not present

## 2019-10-06 DIAGNOSIS — D509 Iron deficiency anemia, unspecified: Secondary | ICD-10-CM | POA: Diagnosis not present

## 2019-10-06 DIAGNOSIS — N186 End stage renal disease: Secondary | ICD-10-CM | POA: Diagnosis not present

## 2019-10-06 DIAGNOSIS — Z992 Dependence on renal dialysis: Secondary | ICD-10-CM | POA: Diagnosis not present

## 2019-10-06 DIAGNOSIS — N2581 Secondary hyperparathyroidism of renal origin: Secondary | ICD-10-CM | POA: Diagnosis not present

## 2019-10-06 DIAGNOSIS — I959 Hypotension, unspecified: Secondary | ICD-10-CM | POA: Diagnosis not present

## 2019-10-07 ENCOUNTER — Encounter (HOSPITAL_BASED_OUTPATIENT_CLINIC_OR_DEPARTMENT_OTHER): Payer: Medicare Other | Admitting: Physician Assistant

## 2019-10-07 ENCOUNTER — Other Ambulatory Visit: Payer: Self-pay

## 2019-10-07 ENCOUNTER — Other Ambulatory Visit: Payer: Medicare Other | Admitting: Hospice

## 2019-10-07 DIAGNOSIS — J449 Chronic obstructive pulmonary disease, unspecified: Secondary | ICD-10-CM | POA: Diagnosis not present

## 2019-10-07 DIAGNOSIS — Z992 Dependence on renal dialysis: Secondary | ICD-10-CM | POA: Diagnosis not present

## 2019-10-07 DIAGNOSIS — I252 Old myocardial infarction: Secondary | ICD-10-CM | POA: Diagnosis not present

## 2019-10-07 DIAGNOSIS — Z515 Encounter for palliative care: Secondary | ICD-10-CM

## 2019-10-07 DIAGNOSIS — E1122 Type 2 diabetes mellitus with diabetic chronic kidney disease: Secondary | ICD-10-CM | POA: Diagnosis not present

## 2019-10-07 DIAGNOSIS — E11621 Type 2 diabetes mellitus with foot ulcer: Secondary | ICD-10-CM | POA: Diagnosis not present

## 2019-10-07 DIAGNOSIS — N186 End stage renal disease: Secondary | ICD-10-CM | POA: Diagnosis not present

## 2019-10-07 DIAGNOSIS — L97822 Non-pressure chronic ulcer of other part of left lower leg with fat layer exposed: Secondary | ICD-10-CM | POA: Diagnosis not present

## 2019-10-07 DIAGNOSIS — L98412 Non-pressure chronic ulcer of buttock with fat layer exposed: Secondary | ICD-10-CM | POA: Diagnosis not present

## 2019-10-07 DIAGNOSIS — E1151 Type 2 diabetes mellitus with diabetic peripheral angiopathy without gangrene: Secondary | ICD-10-CM | POA: Diagnosis not present

## 2019-10-07 DIAGNOSIS — I132 Hypertensive heart and chronic kidney disease with heart failure and with stage 5 chronic kidney disease, or end stage renal disease: Secondary | ICD-10-CM | POA: Diagnosis not present

## 2019-10-07 DIAGNOSIS — L98492 Non-pressure chronic ulcer of skin of other sites with fat layer exposed: Secondary | ICD-10-CM | POA: Diagnosis not present

## 2019-10-07 DIAGNOSIS — E11622 Type 2 diabetes mellitus with other skin ulcer: Secondary | ICD-10-CM | POA: Diagnosis not present

## 2019-10-07 DIAGNOSIS — I872 Venous insufficiency (chronic) (peripheral): Secondary | ICD-10-CM | POA: Diagnosis not present

## 2019-10-07 DIAGNOSIS — L97312 Non-pressure chronic ulcer of right ankle with fat layer exposed: Secondary | ICD-10-CM | POA: Diagnosis not present

## 2019-10-07 DIAGNOSIS — L97512 Non-pressure chronic ulcer of other part of right foot with fat layer exposed: Secondary | ICD-10-CM | POA: Diagnosis not present

## 2019-10-07 DIAGNOSIS — I509 Heart failure, unspecified: Secondary | ICD-10-CM | POA: Diagnosis not present

## 2019-10-07 DIAGNOSIS — T8131XA Disruption of external operation (surgical) wound, not elsewhere classified, initial encounter: Secondary | ICD-10-CM | POA: Diagnosis not present

## 2019-10-07 DIAGNOSIS — T8781 Dehiscence of amputation stump: Secondary | ICD-10-CM | POA: Diagnosis not present

## 2019-10-07 DIAGNOSIS — Z794 Long term (current) use of insulin: Secondary | ICD-10-CM | POA: Diagnosis not present

## 2019-10-07 DIAGNOSIS — L97812 Non-pressure chronic ulcer of other part of right lower leg with fat layer exposed: Secondary | ICD-10-CM | POA: Diagnosis not present

## 2019-10-07 DIAGNOSIS — Z89512 Acquired absence of left leg below knee: Secondary | ICD-10-CM | POA: Diagnosis not present

## 2019-10-07 DIAGNOSIS — E1136 Type 2 diabetes mellitus with diabetic cataract: Secondary | ICD-10-CM | POA: Diagnosis not present

## 2019-10-07 NOTE — Progress Notes (Signed)
[facility] Consult Note Telephone: [phone]  Fax: [phone]  PATIENT NAME: Jeffrey [name] DOB: 1941-04-15 MRN: [id number]  PRIMARY CARE PROVIDER:  [facility] Phys[facility].  Ronita Hipps. MD REFERRING PROVIDER: [facility] Family Physician.  Ronita Hipps. MD RESPONSIBLE PARTY:   Anzar Barnett [id number]   TELEHEALTH VISIT STATEMENT Due to the COVID-19 crisis, this visit was done via telephone from my office. It was initiated and consented to by this patient and/or family.  RECOMMENDATIONS/PLAN:   Advance Care Planning/Goals of Care: Patient remains a full code at this time.  Goals of care include to maximize quality of life and symptom management. Symptom management: Continues on Dialysis M T RS for ESRD; procedure well tolerated.  Follow up: Palliative care will continue to follow patient for goals of care clarification and symptom management. Cellulitis to R arm was resolved with completion of Doxycycline. He continues to see wound Doctor for his leg wound and sacral wound. Spouse said Wound Doctor seen today. Encouraged use of breathing treatments as ordered for management of COPD.  I spent 20  minutes providing this consultation, from 11.15am to 11.35am. More than 50% of the time in this consultation was spent on coordinating communication  HISTORY OF PRESENT ILLNESS:  Jeffrey [name] is a 78 y.o. year old male with multiple medical problems including End stage renal disease, CHF CAD COPD HTN left foot amputation, Afib Type 2 DM. Palliative Care was asked to help address goals of care.  CODE STATUS: Full  PPS: 40% HOSPICE ELIGIBILITY/DIAGNOSIS: TBD  PAST MEDICAL HISTORY:  Past Medical History:  Diagnosis Date  . Anginal pain ([place])   . Aortic stenosis    a. severe by echo 09/2014  . Atrial fibrillation ([place])    a. not well documented, not on anticoagulation  . CHF (congestive heart failure) ([place])    04/28/17 echo-EF  40%, mod LVH, diastolic dysfunction  . Claustrophobia   . Complete heart block ([place])   . COPD (chronic obstructive pulmonary disease) ([place])   . Coronary artery disease    a. chronically occluded RCA per cath 09/2014 with collaterals B. cath 05/01/17 chr occ RCA w/collaterals, 60-70% mid LAD,   . CVA (cerebral vascular accident) ([place]) 10/2014   denies residual on 07/11/2015  . ESRD (end stage renal disease) on dialysis [facility])    a. on dialysis; [facility]; MWF, LUE fistula (07/11/2015)  . History of blood transfusion    "related to gallbladder OR"  . History of stomach ulcers   . Hyperlipidemia   . Hypertension   . Iron deficiency anemia   . Myocardial infarction ([place]) 10/2014  . Peripheral vascular disease ([place])   . Pneumonia   . Presence of permanent cardiac pacemaker   . 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 Type II diabetes mellitus ([place])     SOCIAL HX:  Social History   Tobacco Use  . Smoking status: Former Smoker    Packs/day: 2.00    Years: 32.00    Pack years: 64.00    Quit date: 11/27/1983    Years since quitting: 35.8  . Smokeless tobacco: Never Used  Substance Use Topics  . Alcohol use: Yes    Comment: rarely    ALLERGIES:  Allergies  Allergen Reactions  . Byetta 10 Mcg Pen [Exenatide] Diarrhea and Nausea And Vomiting  .  Telmisartan Other (See Comments)    Unknown reaction - reported by [facility]  . Codeine Itching    Minor reaction  . Coumadin [Warfarin Sodium] Rash    (wife states coumadin was stopped but rash did not disappear 02/21/19)     PERTINENT MEDICATIONS:  Outpatient Encounter Medications as of 10/07/2019  Medication Sig  . acetaminophen (TYLENOL) 500 MG tablet Take 1 tablet (500 mg total) by mouth every 6 (six) hours as needed for mild pain or headache (pain). (Patient taking differently: Take 1,000 mg by mouth every 6 (six)  hours as needed for headache (pain). )  . albuterol (PROVENTIL HFA;VENTOLIN HFA) 108 (90 Base) MCG/ACT inhaler Inhale 2 puffs into the lungs every 4 (four) hours as needed for wheezing or shortness of breath.  [name] albuterol (PROVENTIL) (2.5 MG/3ML) 0.083% nebulizer solution Take 3 mLs (2.5 mg total) by nebulization every 6 (six) hours as needed for wheezing or shortness of breath.  . ALPRAZolam (XANAX) 0.25 MG tablet Take 1 tablet (0.25 mg total) by mouth 2 (two) times daily as needed for anxiety.  [name] apixaban (ELIQUIS) 2.5 MG TABS tablet Take 1 tablet (2.5 mg total) by mouth 2 (two) times daily.  [name] atorvastatin (LIPITOR) 80 MG tablet Take 1 tablet (80 mg total) by mouth daily at 6 PM.  . B Complex-C-Zn-Folic Acid (DIALYVITE [id number] WITH ZINC) 0.8 MG TABS Take 1 tablet by mouth daily.  . budesonide-formoterol (SYMBICORT) 160-4.5 MCG/ACT inhaler Inhale 2 puffs into the lungs 2 (two) times daily as needed (shortness of breath).   . cinacalcet (SENSIPAR) 30 MG tablet Take 30 mg by mouth See admin instructions. Take one tablet (30 mg) by mouth on Monday, Tuesday, Thursday, Saturday after dialysis  . clopidogrel (PLAVIX) 75 MG tablet Take 1 tablet (75 mg total) by mouth daily.  [name] doxercalciferol (HECTOROL) 4 MCG/2ML injection Inject 0.5 mLs (1 mcg total) into the vein Every Tuesday,Thursday,and Saturday with dialysis.  . hydrOXYzine (ATARAX/VISTARIL) 25 MG tablet Take 1 tablet (25 mg total) by mouth every 8 (eight) hours as needed for anxiety.  . insulin detemir (LEVEMIR) 100 UNIT/ML injection Inject 0.2 mLs (20 Units total) into the skin at bedtime.  . Insulin Pen Needle 31G X 6 MM MISC at bedtime.   [name] LEVEMIR FLEXTOUCH 100 UNIT/ML Pen INJECT 48 UNITS TOTAL INTO THE SKIN AT BEDTIME. (Patient taking differently: Inject 18 Units into the skin at bedtime. )  . lidocaine-prilocaine (EMLA) cream Apply 1 application topically as needed (dialysis days).  [name] loperamide (IMODIUM) 2 MG capsule Take 2 mg by mouth as needed  for diarrhea or loose stools.   [name] loratadine (CLARITIN) 10 MG tablet Take 10 mg by mouth daily.   . Melatonin 5 MG TABS Take 5-10 mg by mouth at bedtime as needed (sleep).   . metoprolol tartrate (LOPRESSOR) 25 MG tablet Take 0.5 tablets (12.5 mg total) by mouth See admin instructions. Take 1/2 tablet (12.5 mg) by mouth on Sunday, Monday, Wednesday, Friday mornings (non-dialysis days), take 1/2 tablet (12.5 mg) every evening (Patient taking differently: Take 12.5 mg by mouth See admin instructions. Take 1/2 tablet (12.5 mg) by mouth on Sunday,, Wednesday, Friday mornings (non-dialysis days), take 1/2 tablet (12.5 mg) every evening)  . midodrine (PROAMATINE) 10 MG tablet Take 10 mg by mouth See admin instructions. Take one tablet (10 mg) by mouth 30 minutes before dialysis on Monday, Tuesday, Thursday, Saturday  . midodrine (PROAMATINE) 2.5 MG tablet Take 1 tablet (2.5 mg total) by mouth 3 (three)  times daily with meals. Patient should continue to take 10 mg in the morning prior to hemodialysis sessions. (Patient taking differently: Take 2.5 mg by mouth See admin instructions. Take one tablet (2.5 mg) by mouth three times daily on Sunday, Wednesday and Friday; take one tablet (2.5 mg) twice daily on Monday, Tuesday, Thursday, Saturday (after dialysis and in the evening) . Patient should continue to take 10 mg tablet in the morning prior to hemodialysis sessions.)  . mirtazapine (REMERON) 15 MG tablet Take 7.5 mg by mouth at bedtime. For appetite/sleep  . nitroGLYCERIN (NITROSTAT) 0.4 MG SL tablet Place 1 tablet (0.4 mg total) under the tongue every 5 (five) minutes as needed for chest pain.  [name] oxyCODONE-acetaminophen (PERCOCET/ROXICET) 5-325 MG tablet Take 1-2 tablets by mouth every 6 (six) hours as needed for severe pain.  . OXYGEN Inhale 3 L into the lungs continuous. 2-3 lpm 24/7   . pantoprazole (PROTONIX) 40 MG tablet Take 1 tablet (40 mg total) by mouth daily.  . polyethylene glycol powder  (GLYCOLAX/MIRALAX) 17 GM/SCOOP powder Take 17 g by mouth 2 (two) times daily.  . sevelamer carbonate (RENVELA) 800 MG tablet Take 1,600-2,400 mg by mouth See admin instructions. Take 3 tablets (2400 mg) by mouth with meals and 2 tablets (1600 mg) with snacks   No facility-administered encounter medications on file as of 10/07/2019.     Teodoro Spray, NP

## 2019-10-07 NOTE — Progress Notes (Signed)
Jeffrey [name], Jeffrey [name] ([id number]) Visit Report for 10/07/2019 Chief Complaint Document Details Patient Name: Date of Service: Jeffrey [name], Jeffrey [name] 10/07/2019 10:15 AM Medical Record [id number] Patient Account Number: [id number] Date of Birth/Sex: Treating RN: Jan 22, 1941 (78 y.o. Jeffrey [name] Primary Care Provider: Molli [name] Other Clinician: Referring Provider: Treating Provider/Extender:[name], Jeffrey [name], Jeffrey [name] in Treatment: 13 Information Obtained from: Patient Chief Complaint Bilateral LE Ulcers Electronic Signature(s) Signed: 10/07/2019 5:02:14 PM By: Worthy Keeler PA-C Entered By: Worthy Keeler on 10/07/2019 11:04:21 -------------------------------------------------------------------------------- HPI Details Patient Name: Date of Service: Jeffrey [name], Jeffrey [name] 10/07/2019 10:15 AM Medical Record [id number] Patient Account Number: [id number] Date of Birth/Sex: Treating RN: Oct 13, 1941 (78 y.o. Jeffrey [name] Primary Care Provider: Molli [name] Other Clinician: Referring Provider: Treating Provider/Extender:[name], Jeffrey [name], Jeffrey [name] in Treatment: 13 History of Present Illness HPI Description: 07/08/2019 on evaluation today patient presents for initial evaluation in clinic concerning issues that began about 3 [name] ago he states. He recently underwent a left BKA to the under the care of Dr. Doren [name] and this was on Apr 14, 2019. Subsequently he developed an area of what appears to be abrasion just below this amputation site which they feel occurred as a result of utilization of the shrinker. With that being said the good news is this does not really appear to be doing too poorly at all. The bigger issue is that he has multiple ulcers of the right lower extremity where 2 of the areas appear to be more blisters and in fact 1 of them we did not even count as it does not appear to be an open wound at all. The other site is on the  lateral malleolus and I believe this may be not just venous related but potentially some pressure component to it as well. Nonetheless it does sound like he sleeps in his chair with his leg up on a stool and subsequently this causes some pressure to the lateral portion of his ankle as it does press on the stool his wife tells me. He is not able to lay flat such as in the bed or even reclined back in his recliner due to issues with his breathing. 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 He also has congestive heart failure. Currently they have been using meta honey on the wounds until they ran out prior to coming here. His most recent hemoglobin A1c at dialysis was 7.2. 07/15/2019 upon evaluation today patient appears to be doing overall quite poorly well with regard to his left distal stump ulcer. This has healed they have not been using the shrinker and fortunately this healed quite nicely. She did not try the foam underneath the shrinker she was afraid that it would cause too much moisture. This is his daughter. Subsequently in regard to the right lower extremity patient actually has been having to change this twice a day most days. Unfortunately he has a lot of drainage and the dressings that are putting on just do not seem to be lasting. Nonetheless I feel like we may need to initiate some kind of compression although we have to be very cautious with this based on his arterial study which shows that he does have a TBI of 0.43 this is not in the best range obviously. 07/22/2019 on evaluation today  patient appears to be doing decently well with regard to his wounds. I do not see any evidence of active infection at this time which is good news. Fortunately there is no signs of  worsening and in fact his wounds actually appear to be doing better which is great news. No fevers, chills, nausea, vomiting, or diarrhea. 07/29/19 on evaluation today patient actually appears to be doing quite well with regard to his wounds currently on the right lateral leg and right medial leg. With that being said the right lateral malleolus did have some Slough noted upon evaluation today this is gonna need to be addressed by way of sharp debridement. With that being said I feel like that we can appropriately debride this without complication which is good news. He has some discomfort but nothing too significant. 08/05/2019 on evaluation today patient actually appears to be doing quite well with regard to his left medial lower extremity ulcer and his lateral lower extremity ulcer is completely healed. With that being said he is having some issues still with the right lateral ankle I think this is simply due to the fact that he is constantly putting pressure on this when he sleeping at night or even when he is in the dialysis chair for that reason during the day. Nonetheless I believe that he would benefit from having more appropriate offloading possibly even a Prevalon boot could be of benefit to him. 08/12/2019 on evaluation today patient appears to be doing better in regard to his right lower extremity ulcers in general which is good news. Unfortunately in regard to his left lower extremity he appears to be doing more poorly today at this time. In fact the area that we were concerned about we were trying to protect last week is opened up into a true open wound from the blister that is shown up. He also has a second blister on the distal aspect of his stump which is open at this point in time just draining a little bit but not completely cleared away as far as the blistered skin is concerned. Subsequently we will get to keep an eye on this that may need to be removed or come off itself by next  week we shall see. 08/26/2019 on evaluation today patient actually was in the hospital for a period of time from 08/14/2019 through 08/20/2019. Unfortunately he had several episodes of shortness of breath with intermittent chest pain and altered mental status with decreased level of consciousness and EMS was called. He was evaluated in the hospital where throughout the hospital course he had multiple issues including some trouble with NSVT but no persistent SVT. Cardiology consider cardioversion at one point but then did not. Apparently wound care did see him for his ulcers on his lower extremities although the patient states he really was not impressed with what they were doing. He does have follow-up appointments with cardiology upon discharge as well as continuing with dialysis and seen his primary care provider in 1 to 2 [name] due to his T3 and T4 levels since he did have an elevated TSH in the hospital. His wounds today appear to be doing fairly well and in fact the left stump wounds are dramatically improved compared to when I last saw him. On the right the original wounds seem to be doing slightly better although he does have a new wound on his toe unfortunately. This is the right second toe. 09/02/2019 on evaluation today patient appears to be doing roughly about  the same in regard to his wounds in general. He has not really made significant improvements but all in all things appear to be fairly stable which is good news. Unfortunately he was in dialysis when his blood pressure and oxygen saturation dropped recently he ended up going to the hospital. He was seen in the emergency department where on 08/29/2019 he was told by the physician who was caring for him and his wife was told as well that they should call hospice in as he was not likely to live much longer. Nonetheless that is something that his wife and daughter state they do not want to do obviously under hospice he tells me the biggest  issue is he is not allowed to have dialysis which again would lead to his imminent demise. Nonetheless at this point he wants to try it as much as possible enjoy the remaining time that he has with his family which I can respect. In the meantime were trying to manage his wounds as much as we can and as well as we can though again I am not sure how likely he will be to heal everything. 09/09/2019 on evaluation today patient appears to be doing better in my opinion in regard to pre-much all of his wounds on the lower extremity. There does not appear to be any signs of active infection at this time which is good news. No fevers, chills, nausea, vomiting, or diarrhea. Overall I think that things are doing about as well as we can expect at this point. He did have a lot of fluid pulled off today and his right arm is extremely swollen that is uncomfortable for him he is most concerned about that at this time. 09/16/2019 on evaluation today patient appears to be doing well with regard to some of his wounds although he has new wounds in other locations. This includes his right third toe which unfortunately is cracked underneath and appears to be bleeding quite proficiently. We were able to get this stopped today but nonetheless that is definitely not a good sign I am not sure exactly what happened here he really does not know either. He also has new wounds on the gluteal region and the perineum specifically as well as the ischium. Unfortunately these are quite painful at this point. Overall again he seems to be doing better in some regards and worse than others. 09/30/2019 on evaluation today patient actually appears to be doing fairly well with regard to his wounds. He has been tolerating the dressing changes without complication. Most of the wounds are measuring at least the same if not a little better across the board and in regard to his toes they seem to be drying up quite a bit and the area underneath  where he was bleeding so much on the plantar aspect of his toe actually is not bleeding as much today though I was very cautious not to pull or try to stretch it too much as that obviously would likely start the bleeding all over again. 10/07/2019 on evaluation today patient actually appears to be doing a little better in regard to most of his wounds at this point in my opinion. He has been tolerating the dressing changes without complication. Fortunately there is no signs of active infection at this point. No fevers, chills, nausea, vomiting, or diarrhea. Electronic Signature(s) Signed: 10/07/2019 5:02:14 PM By: Worthy Keeler PA-C Entered By: Worthy Keeler on 10/07/2019 11:42:18 -------------------------------------------------------------------------------- Physical Exam Details Patient Name: Date of  Service: Jeffrey [name], Jeffrey [name] 10/07/2019 10:15 AM Medical Record [id number] Patient Account Number: [id number] Date of Birth/Sex: Treating RN: [birth year] (78 y.o. Jeffrey [name] Primary Care Provider: Molli [name] Other Clinician: Referring Provider: Treating Provider/Extender:[name], Jeffrey [name], Jeffrey [name] in Treatment: 18 Constitutional Well-nourished and well-hydrated in no acute distress. Respiratory normal breathing without difficulty. clear to auscultation bilaterally. Cardiovascular regular rate and rhythm with normal S1, S2. Psychiatric this patient is able to make decisions and demonstrates good insight into disease process. Alert and Oriented x 3. pleasant and cooperative. Notes Patient's wound bed currently showed signs of good granulation at this time and overall he seems to be doing well at pretty much every location at this point. Even his ankle where he does not have as much granulation but still this is not as large nor showing as much necrotic tissue as has been noted in the past. Overall though slow I feel like he is making a little bit of  progress here in the area which is good news. Electronic Signature(s) Signed: 10/07/2019 5:02:14 PM By: Worthy Keeler PA-C Entered By: Worthy Keeler on 10/07/2019 11:43:27 -------------------------------------------------------------------------------- Physician Orders Details Patient Name: Date of Service: Jeffrey [name], Jeffrey [name] 10/07/2019 10:15 AM Medical Record [id number] Patient Account Number: [id number] Date of Birth/Sex: Treating RN: 05-01-1941 (78 y.o. Jeffrey [name] Primary Care Provider: Molli [name] Other Clinician: Referring Provider: Treating Provider/Extender:[name], Jeffrey [name], Jeffrey [name] in Treatment: 43 Verbal / Phone Orders: No Diagnosis Coding ICD-10 Coding Code Description I87.2 Venous insufficiency (chronic) (peripheral) I73.9 Peripheral vascular disease, unspecified L97.312 Non-pressure chronic ulcer of right ankle with fat layer exposed L97.812 Non-pressure chronic ulcer of other part of right lower leg with fat layer exposed Z89.512 Acquired absence of left leg below knee T81.31XA Disruption of external operation (surgical) wound, not elsewhere classified, initial encounter E11.622 Type 2 diabetes mellitus with other skin ulcer N18.6 End stage renal disease Z99.2 Dependence on renal dialysis Follow-up Appointments Return Appointment in 1 week. Dressing Change Frequency Wound #1 Right,Lateral Malleolus Do not change entire dressing for one week. - all right leg wounds Wound #10 Right,Plantar Toe Third Change Dressing every other day. Wound #7 Right Toe Second Change Dressing every other day. Wound #8 Left Ischium Change dressing every day. Wound #9 Perineum Change dressing every day. Skin Barriers/Peri-Wound Care Moisturizing lotion - to leg and foot under wrap Wound Cleansing Wound #1 Right,Lateral Malleolus May shower with protection. Wound #10 Right,Plantar Toe Third Clean wound with Wound Cleanser Wound #7 Right Toe  Second Clean wound with Wound Cleanser Wound #8 Left Ischium Clean wound with Wound Cleanser Wound #9 Perineum Clean wound with Wound Cleanser Primary Wound Dressing Wound #1 Right,Lateral Malleolus Calcium Alginate with Silver Wound #10 Right,Plantar Toe Third Calcium Alginate with Silver Wound #4 Right,Medial Lower Leg Calcium Alginate with Silver Wound #7 Right Toe Second Calcium Alginate with Silver Wound #8 Left Ischium Calcium Alginate with Silver Wound #9 Perineum Other: - Thick layer of zinc oxide paste (barrier paste/cream) Secondary Dressing Wound #1 Right,Lateral Malleolus Dry Gauze Wound #10 Right,Plantar Toe Third Kerlix/Rolled Gauze Dry Gauze Wound #4 Right,Medial Lower Leg Dry Gauze ABD pad Wound #7 Right Toe Second Kerlix/Rolled Gauze Dry Gauze Wound #8 Left Ischium Foam Border - or bordered gauze Edema Control Kerlix and Coban - Right Lower Extremity - do not wrap too tight Elevate legs to the level of the heart or above for 30 minutes daily and/or when sitting, a frequency of: - throughout the day Other: -  stump shrinker daily Off-Loading Other: - do not let ankle roll out to the side, no pressure on right lateral ankle, use piece of thick foam with hole cut out for ankle especially at night [place] to [place] for [place] Signature(s) Signed: 10/07/2019 5:02:14 PM By: Worthy Keeler PA-C Signed: 10/07/2019 5:49:26 PM By: Baruch Gouty RN, BSN Entered By: Baruch Gouty on 10/07/2019 11:43:20 -------------------------------------------------------------------------------- Problem List Details Patient Name: Date of Service: Jeffrey [name] 10/07/2019 10:15 AM Medical Record [id number] Patient Account Number: [id number] Date of Birth/Sex: Treating RN: 1941/08/14 (78 y.o. Jeffrey [name] Primary Care Provider: Molli [name] Other Clinician: Referring Provider: Treating Provider/Extender:[name],  Jeffrey [name], Jeffrey [name] in Treatment: 13 Active Problems ICD-10 Evaluated Encounter Code Description Active Date Today Diagnosis I87.2 Venous insufficiency (chronic) (peripheral) 07/08/2019 No Yes I73.9 Peripheral vascular disease, unspecified 07/08/2019 No Yes L97.312 Non-pressure chronic ulcer of right ankle with fat layer 07/08/2019 No Yes exposed L97.812 Non-pressure chronic ulcer of other part of right lower 07/08/2019 No Yes leg with fat layer exposed Z89.512 Acquired absence of left leg below knee 07/08/2019 No Yes T81.31XA Disruption of external operation (surgical) wound, not 07/08/2019 No Yes elsewhere classified, initial encounter E11.622 Type 2 diabetes mellitus with other skin ulcer 07/08/2019 No Yes N18.6 End stage renal disease 07/08/2019 No Yes Z99.2 Dependence on renal dialysis 07/08/2019 No Yes Inactive Problems Resolved Problems Electronic Signature(s) Signed: 10/07/2019 5:02:14 PM By: Worthy Keeler PA-C Entered By: Worthy Keeler on 10/07/2019 11:04:16 -------------------------------------------------------------------------------- Progress Note Details Patient Name: Date of Service: Jeffrey [name], Jeffrey [name] 10/07/2019 10:15 AM Medical Record [id number] Patient Account Number: [id number] Date of Birth/Sex: Treating RN: June 24, 1941 (78 y.o. Jeffrey [name] Primary Care Provider: Molli [name] Other Clinician: Referring Provider: Treating Provider/Extender:[name], Jeffrey [name], Jeffrey [name] in Treatment: 13 Subjective Chief Complaint Information obtained from Patient Bilateral LE Ulcers History of Present Illness (HPI) 07/08/2019 on evaluation today patient presents for initial evaluation in clinic concerning issues that began about 3 [name] ago he states. He recently underwent a left BKA to the under the care of Dr. Doren [name] and this was on Apr 14, 2019. Subsequently he developed an area of what appears to be abrasion just below this amputation site  which they feel occurred as a result of utilization of the shrinker. With that being said the good news is this does not really appear to be doing too poorly at all. The bigger issue is that he has multiple ulcers of the right lower extremity where 2 of the areas appear to be more blisters and in fact 1 of them we did not even count as it does not appear to be an open wound at all. The other site is on the lateral malleolus and I believe this may be not just venous related but potentially some pressure component to it as well. Nonetheless it does sound like he sleeps in his chair with his leg up on a stool and subsequently this causes some pressure to the lateral portion of his ankle as it does press on the stool his wife tells me. He is not able to lay flat such as in the bed or even reclined back in his recliner due to issues with his breathing. 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 He also  has congestive heart failure. Currently they have been using meta honey on the wounds until they ran out prior to coming here. His most recent hemoglobin A1c at dialysis was 7.2. 07/15/2019 upon evaluation today patient appears to be doing overall quite poorly well with regard to his left distal stump ulcer. This has healed they have not been using the shrinker and fortunately this healed quite nicely. She did not try the foam underneath the shrinker she was afraid that it would cause too much moisture. This is his daughter. Subsequently in regard to the right lower extremity patient actually has been having to change this twice a day most days. Unfortunately he has a lot of drainage and the dressings that are putting on just do not seem to be lasting. Nonetheless I feel like we may need to  initiate some kind of compression although we have to be very cautious with this based on his arterial study which shows that he does have a TBI of 0.43 this is not in the best range obviously. 07/22/2019 on evaluation today patient appears to be doing decently well with regard to his wounds. I do not see any evidence of active infection at this time which is good news. Fortunately there is no signs of worsening and in fact his wounds actually appear to be doing better which is great news. No fevers, chills, nausea, vomiting, or diarrhea. 07/29/19 on evaluation today patient actually appears to be doing quite well with regard to his wounds currently on the right lateral leg and right medial leg. With that being said the right lateral malleolus did have some Slough noted upon evaluation today this is gonna need to be addressed by way of sharp debridement. With that being said I feel like that we can appropriately debride this without complication which is good news. He has some discomfort but nothing too significant. 08/05/2019 on evaluation today patient actually appears to be doing quite well with regard to his left medial lower extremity ulcer and his lateral lower extremity ulcer is completely healed. With that being said he is having some issues still with the right lateral ankle I think this is simply due to the fact that he is constantly putting pressure on this when he sleeping at night or even when he is in the dialysis chair for that reason during the day. Nonetheless I believe that he would benefit from having more appropriate offloading possibly even a Prevalon boot could be of benefit to him. 08/12/2019 on evaluation today patient appears to be doing better in regard to his right lower extremity ulcers in general which is good news. Unfortunately in regard to his left lower extremity he appears to be doing more poorly today at this time. In fact the area that we were concerned about we were  trying to protect last week is opened up into a true open wound from the blister that is shown up. He also has a second blister on the distal aspect of his stump which is open at this point in time just draining a little bit but not completely cleared away as far as the blistered skin is concerned. Subsequently we will get to keep an eye on this that may need to be removed or come off itself by next week we shall see. 08/26/2019 on evaluation today patient actually was in the hospital for a period of time from 08/14/2019 through 08/20/2019. Unfortunately he had several episodes of shortness of breath with intermittent chest pain and  altered mental status with decreased level of consciousness and EMS was called. He was evaluated in the hospital where throughout the hospital course he had multiple issues including some trouble with NSVT but no persistent SVT. Cardiology consider cardioversion at one point but then did not. Apparently wound care did see him for his ulcers on his lower extremities although the patient states he really was not impressed with what they were doing. He does have follow-up appointments with cardiology upon discharge as well as continuing with dialysis and seen his primary care provider in 1 to 2 [name] due to his T3 and T4 levels since he did have an elevated TSH in the hospital. His wounds today appear to be doing fairly well and in fact the left stump wounds are dramatically improved compared to when I last saw him. On the right the original wounds seem to be doing slightly better although he does have a new wound on his toe unfortunately. This is the right second toe. 09/02/2019 on evaluation today patient appears to be doing roughly about the same in regard to his wounds in general. He has not really made significant improvements but all in all things appear to be fairly stable which is good news. Unfortunately he was in dialysis when his blood pressure and oxygen saturation  dropped recently he ended up going to the hospital. He was seen in the emergency department where on 08/29/2019 he was told by the physician who was caring for him and his wife was told as well that they should call hospice in as he was not likely to live much longer. Nonetheless that is something that his wife and daughter state they do not want to do obviously under hospice he tells me the biggest issue is he is not allowed to have dialysis which again would lead to his imminent demise. Nonetheless at this point he wants to try it as much as possible enjoy the remaining time that he has with his family which I can respect. In the meantime were trying to manage his wounds as much as we can and as well as we can though again I am not sure how likely he will be to heal everything. 09/09/2019 on evaluation today patient appears to be doing better in my opinion in regard to pre-much all of his wounds on the lower extremity. There does not appear to be any signs of active infection at this time which is good news. No fevers, chills, nausea, vomiting, or diarrhea. Overall I think that things are doing about as well as we can expect at this point. He did have a lot of fluid pulled off today and his right arm is extremely swollen that is uncomfortable for him he is most concerned about that at this time. 09/16/2019 on evaluation today patient appears to be doing well with regard to some of his wounds although he has new wounds in other locations. This includes his right third toe which unfortunately is cracked underneath and appears to be bleeding quite proficiently. We were able to get this stopped today but nonetheless that is definitely not a good sign I am not sure exactly what happened here he really does not know either. He also has new wounds on the gluteal region and the perineum specifically as well as the ischium. Unfortunately these are quite painful at this point. Overall again he seems to be  doing better in some regards and worse than others. 09/30/2019 on evaluation today patient actually appears  to be doing fairly well with regard to his wounds. He has been tolerating the dressing changes without complication. Most of the wounds are measuring at least the same if not a little better across the board and in regard to his toes they seem to be drying up quite a bit and the area underneath where he was bleeding so much on the plantar aspect of his toe actually is not bleeding as much today though I was very cautious not to pull or try to stretch it too much as that obviously would likely start the bleeding all over again. 10/07/2019 on evaluation today patient actually appears to be doing a little better in regard to most of his wounds at this point in my opinion. He has been tolerating the dressing changes without complication. Fortunately there is no signs of active infection at this point. No fevers, chills, nausea, vomiting, or diarrhea. Patient History Unable to Obtain Patient History due to Altered Mental Status. Information obtained from Patient. Family History Diabetes - Father, No family history of Cancer, Heart Disease, Hereditary Spherocytosis, Hypertension, Kidney Disease, Lung Disease, Seizures, Stroke, Thyroid Problems, Tuberculosis. Social History Former smoker, Marital Status - Married, Alcohol Use - Rarely, Drug Use - No History, Caffeine Use - Never. Medical History Eyes Patient has history of Cataracts Denies history of Glaucoma, Optic Neuritis Ear/Nose/Mouth/Throat Denies history of Chronic sinus problems/congestion, Middle ear problems Hematologic/Lymphatic Denies history of Anemia, Hemophilia, Human Immunodeficiency Virus, Lymphedema, Sickle Cell Disease Respiratory Patient has history of Chronic Obstructive Pulmonary Disease (COPD) Denies history of Aspiration, Asthma, Pneumothorax, Sleep Apnea, Tuberculosis Cardiovascular Patient has history of  Congestive Heart Failure, Hypertension, Myocardial Infarction, Peripheral Venous Disease Denies history of Angina, Arrhythmia, Coronary Artery Disease, Deep Vein Thrombosis, Hypotension, Peripheral Arterial Disease, Phlebitis, Vasculitis Gastrointestinal Denies history of Cirrhosis , Colitis, Crohnoos, Hepatitis A, Hepatitis B, Hepatitis C Endocrine Patient has history of Type II Diabetes Denies history of Type I Diabetes Genitourinary Patient has history of End Stage Renal Disease Immunological Denies history of Lupus Erythematosus, Raynaudoos, Scleroderma Integumentary (Skin) Denies history of History of Burn Musculoskeletal Patient has history of Osteomyelitis - left foot Denies history of Gout, Rheumatoid Arthritis, Osteoarthritis Neurologic Denies history of Dementia, Neuropathy, Quadriplegia, Paraplegia, Seizure Disorder Oncologic Denies history of Received Chemotherapy, Received Radiation Psychiatric Denies history of Anorexia/bulimia, Confinement Anxiety Hospitalization/Surgery History - 08/14/2019 [place]- faint/ decreased LOC/ chest pain. Medical And Surgical History Notes Genitourinary dialysis Review of Systems (ROS) Respiratory Denies complaints or symptoms of Chronic or frequent coughs, Shortness of Breath. Cardiovascular Denies complaints or symptoms of Chest pain. Psychiatric Denies complaints or symptoms of Claustrophobia, Suicidal. Objective Constitutional Well-nourished and well-hydrated in no acute distress. Vitals Time Taken: 11:00 AM, Height: 71 in, Weight: 209 lbs, BMI: 29.1, Temperature: 97.6 F, Pulse: 78 bpm, Respiratory Rate: 18 breaths/min, Blood Pressure: 91/37 mmHg. Respiratory normal breathing without difficulty. clear to auscultation bilaterally. Cardiovascular regular rate and rhythm with normal S1, S2. Psychiatric this patient is able to make decisions and demonstrates good insight into disease process. Alert and Oriented x  3. pleasant and cooperative. General Notes: Patient's wound bed currently showed signs of good granulation at this time and overall he seems to be doing well at pretty much every location at this point. Even his ankle where he does not have as much granulation but still this is not as large nor showing as much necrotic tissue as has been noted in the past. Overall though slow I feel like he is making a little bit  of progress here in the area which is good news. Integumentary (Hair, Skin) Wound #1 status is Open. Original cause of wound was Gradually Appeared. The wound is located on the Right,Lateral Malleolus. The wound measures 1cm length x 0.6cm width x 0.2cm depth; 0.471cm^2 area and 0.094cm^3 volume. There is Fat Layer (Subcutaneous Tissue) Exposed exposed. There is no tunneling or undermining noted. There is a small amount of serosanguineous drainage noted. The wound margin is distinct with the outline attached to the wound base. There is medium (34-66%) pink granulation within the wound bed. There is a medium (34-66%) amount of necrotic tissue within the wound bed including Adherent Slough. Wound #10 status is Open. Original cause of wound was Gradually Appeared. The wound is located on the Right,Plantar Toe Third. The wound measures 0.7cm length x 1.5cm width x 0.2cm depth; 0.825cm^2 area and 0.165cm^3 volume. There is Fat Layer (Subcutaneous Tissue) Exposed exposed. There is no tunneling or undermining noted. There is a medium amount of sanguinous drainage noted. The wound margin is flat and intact. There is large (67-100%) red, friable granulation within the wound bed. There is no necrotic tissue within the wound bed. Wound #4 status is Open. Original cause of wound was Blister. The wound is located on the Right,Medial Lower Leg. The wound measures 1.5cm length x 4cm width x 0.2cm depth; 4.712cm^2 area and 0.942cm^3 volume. There is Fat Layer (Subcutaneous Tissue) Exposed exposed.  There is no tunneling or undermining noted. There is a small amount of serosanguineous drainage noted. The wound margin is distinct with the outline attached to the wound base. There is large (67-100%) red granulation within the wound bed. There is a small (1-33%) amount of necrotic tissue within the wound bed including Adherent Slough. Wound #5 status is Healed - Epithelialized. Original cause of wound was Gradually Appeared. The wound is located on the Amputation Site - Below Knee. The wound measures 0cm length x 0cm width x 0cm depth; 0cm^2 area and 0cm^3 volume. There is Fat Layer (Subcutaneous Tissue) Exposed exposed. There is no tunneling or undermining noted. There is a small amount of serosanguineous drainage noted. The wound margin is distinct with the outline attached to the wound base. There is large (67-100%) red granulation within the wound bed. There is no necrotic tissue within the wound bed. Wound #7 status is Open. Original cause of wound was Gradually Appeared. The wound is located on the Right Toe Second. The wound measures 2cm length x 1.8cm width x 0.1cm depth; 2.827cm^2 area and 0.283cm^3 volume. There is Fat Layer (Subcutaneous Tissue) Exposed exposed. There is no tunneling or undermining noted. There is a small amount of serosanguineous drainage noted. The wound margin is distinct with the outline attached to the wound base. There is small (1-33%) pink granulation within the wound bed. There is a large (67-100%) amount of necrotic tissue within the wound bed including Eschar and Adherent Slough. Wound #8 status is Open. Original cause of wound was Gradually Appeared. The wound is located on the Left Ischium. The wound measures 0.1cm length x 0.1cm width x 0.1cm depth; 0.008cm^2 area and 0.001cm^3 volume. There is Fat Layer (Subcutaneous Tissue) Exposed exposed. There is no tunneling or undermining noted. There is a medium amount of serosanguineous drainage noted. There is  large (67-100%) red granulation within the wound bed. There is a small (1-33%) amount of necrotic tissue within the wound bed including Adherent Slough. Wound #9 status is Open. Original cause of wound was Gradually Appeared. The wound  is located on the Perineum. The wound measures 0.2cm length x 0.2cm width x 0.1cm depth; 0.031cm^2 area and 0.003cm^3 volume. There is Fat Layer (Subcutaneous Tissue) Exposed exposed. There is no tunneling or undermining noted. There is a medium amount of serosanguineous drainage noted. There is medium (34-66%) red granulation within the wound bed. There is a medium (34-66%) amount of necrotic tissue within the wound bed including Adherent Slough. 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 Dressing Change Frequency: Wound #1 Right,Lateral Malleolus: Do not change entire dressing for one week. - all right leg wounds Wound #10 Right,Plantar Toe Third: Change Dressing every other day. Wound #7 Right Toe Second: Change Dressing every other day. Wound #8 Left Ischium: Change dressing every day. Wound #9 Perineum: Change dressing every day. Skin Barriers/Peri-Wound Care: Moisturizing lotion - to leg and foot under wrap Wound Cleansing: Wound #1 Right,Lateral Malleolus: May shower with protection. Wound #10 Right,Plantar Toe Third: Clean wound with Wound Cleanser Wound #7 Right Toe Second: Clean wound with Wound Cleanser Wound #8 Left  Ischium: Clean wound with Wound Cleanser Wound #9 Perineum: Clean wound with Wound Cleanser Primary Wound Dressing: Wound #1 Right,Lateral Malleolus: Calcium Alginate with Silver Wound #10 Right,Plantar Toe Third: Calcium Alginate with Silver Wound #4 Right,Medial Lower Leg: Calcium Alginate with Silver Wound #7 Right Toe Second: Calcium Alginate with Silver Wound #8 Left Ischium: Calcium Alginate with Silver Wound #9 Perineum: Other: - Thick layer of zinc oxide paste (barrier paste/cream) Secondary Dressing: Wound #1 Right,Lateral Malleolus: Dry Gauze Wound #10 Right,Plantar Toe Third: Kerlix/Rolled Gauze Dry Gauze Wound #4 Right,Medial Lower Leg: Dry Gauze ABD pad Wound #7 Right Toe Second: Kerlix/Rolled Gauze Dry Gauze Wound #8 Left Ischium: Foam Border - or bordered gauze Edema Control: Kerlix and Coban - Right Lower Extremity - do not wrap too tight Elevate legs to the level of the heart or above for 30 minutes daily and/or when sitting, a frequency of: - throughout the day Other: - stump shrinker daily Off-Loading: Other: - do not let ankle roll out to the side, no pressure on right lateral ankle, use piece of thick foam with hole cut out for ankle especially at night Home Health: Admit to [facility] 1. My suggestion at this time is good to be that we go ahead and initiate treatment with the continuation of a silver alginate dressing at this point. The main issues that I see really are his toes wear or try to keep this is dry as possible to prevent infection both of the toes on the right, the second, and the third, both are at risk for infection and having further complication. Thus what we are trying to avoid. 2. With regard to his left stump this mainly needs the stump shrinker at this point I do not see anything truly open I do not think he needs any additional treatment as far as managing is concerned to be honest. 3. We will continue with  the very light Kerlix and Coban wrap to the right lower extremity which seems to be doing well for him.  We will see patient back for reevaluation in 1 week here in the clinic. If anything worsens or changes patient will contact our office for additional recommendations. Electronic Signature(s) Signed: 10/07/2019 5:02:14 PM By: Worthy Keeler PA-C Entered By: Worthy Keeler on 10/07/2019 11:44:04 -------------------------------------------------------------------------------- HxROS Details Patient Name: Date of Service: Jeffrey [name], Jeffrey [name] 10/07/2019 10:15 AM Medical Record [id number] Patient Account Number: [id number] Date of Birth/Sex: Treating RN: 1941/02/05 (78 y.o. Jeffrey [name] Primary Care Provider: Molli [name] Other Clinician: Referring Provider: Treating Provider/Extender:[name], Jeffrey [name], Jeffrey [name] in Treatment: 57 Unable to Obtain Patient History due to Altered Mental Status Information Obtained From Patient Respiratory Complaints and Symptoms: Negative for: Chronic or frequent coughs; Shortness of Breath Medical History: Positive for: Chronic Obstructive Pulmonary Disease (COPD) Negative for: Aspiration; Asthma; Pneumothorax; Sleep Apnea; Tuberculosis Cardiovascular Complaints and Symptoms: Negative for: Chest pain Medical History: Positive for: Congestive Heart Failure; Hypertension; Myocardial Infarction; Peripheral Venous Disease Negative for: Angina; Arrhythmia; Coronary Artery Disease; Deep Vein Thrombosis; Hypotension; Peripheral Arterial Disease; Phlebitis; Vasculitis Psychiatric Complaints and Symptoms: Negative for: Claustrophobia; Suicidal Medical History: Negative for: Anorexia/bulimia; Confinement Anxiety Eyes Medical History: Positive for: Cataracts Negative for: Glaucoma; Optic Neuritis Ear/Nose/Mouth/Throat Medical History: Negative for: Chronic sinus problems/congestion; Middle ear  problems Hematologic/Lymphatic Medical History: Negative for: Anemia; Hemophilia; Human Immunodeficiency Virus; Lymphedema; Sickle Cell Disease Gastrointestinal Medical History: Negative for: Cirrhosis ; Colitis; Crohns; Hepatitis A; Hepatitis B; Hepatitis C Endocrine Medical History: Positive for: Type II Diabetes Negative for: Type I Diabetes Time with diabetes: 22 Treated with: Insulin Blood sugar tested every day: No Genitourinary Medical History: Positive for: End Stage Renal Disease Past Medical History Notes: dialysis Immunological Medical History: Negative for: Lupus Erythematosus; Raynauds; Scleroderma Integumentary (Skin) Medical History: Negative for: History of Burn Musculoskeletal Medical History: Positive for: Osteomyelitis - left foot Negative for: Gout; Rheumatoid Arthritis; Osteoarthritis Neurologic Medical History: Negative for: Dementia; Neuropathy; Quadriplegia; Paraplegia; Seizure Disorder Oncologic Medical History: Negative for: Received Chemotherapy; Received Radiation HBO Extended History Items Eyes: Cataracts Immunizations Pneumococcal Vaccine: Received Pneumococcal Vaccination: No Implantable Devices Yes Hospitalization / Surgery History Type of Hospitalization/Surgery 08/14/2019 [place]- faint/ decreased LOC/ chest pain Family and Social History Cancer: No; Diabetes: Yes - Father; Heart Disease: No; Hereditary Spherocytosis: No; Hypertension: No; Kidney Disease: No; Lung Disease: No; Seizures: No; Stroke: No; Thyroid Problems: No; Tuberculosis: No; Former smoker; Marital Status - Married; Alcohol Use: Rarely; Drug Use: No History; Caffeine Use: Never; Financial Concerns: No; Food, Clothing or Shelter Needs: No; Support System Lacking: No; Transportation Concerns: No Physician Affirmation I have reviewed and agree with the above information. Electronic Signature(s) Signed: 10/07/2019 5:02:14 PM By: Worthy Keeler PA-C Signed:  10/07/2019 5:49:26 PM By: Baruch Gouty RN, BSN Entered By: Worthy Keeler on 10/07/2019 11:42:47 -------------------------------------------------------------------------------- SuperBill Details Patient Name: Date of Service: Jeffrey [name], Jeffrey [name] 10/07/2019 Medical Record [id number] Patient Account Number: [id number] Date of Birth/Sex: Treating RN: [birth year] (78 y.o. Jeffrey [name] Primary Care Provider: Molli [name] Other Clinician: Referring Provider: Treating Provider/Extender:[name], Jeffrey [name], Jeffrey [name] in Treatment: 13 Diagnosis Coding ICD-10 Codes Code Description I87.2 Venous insufficiency (chronic) (peripheral) I73.9 Peripheral vascular disease, unspecified L97.312 Non-pressure chronic ulcer of right ankle with fat layer exposed L97.812 Non-pressure chronic ulcer of other part of right lower leg with fat layer exposed Z89.512 Acquired absence of left leg below knee T81.31XA Disruption of external operation (surgical) wound, not elsewhere classified, initial encounter E11.622 Type 2 diabetes mellitus with other skin ulcer N18.6 End stage renal disease Z99.2  Dependence on renal dialysis Facility Procedures CPT4 Code: [id number] Description: [id number] - WOUND CARE VISIT-LEV 5 EST PT 1 Modifier: Quantity: Electronic Signature(s) Signed: 10/07/2019 5:02:14 PM By: Worthy Keeler PA-C Signed: 10/07/2019 5:49:26 PM By: Baruch Gouty RN, BSN Entered By: Baruch Gouty on 10/07/2019 11:44:32

## 2019-10-08 DIAGNOSIS — R52 Pain, unspecified: Secondary | ICD-10-CM | POA: Diagnosis not present

## 2019-10-08 DIAGNOSIS — D631 Anemia in chronic kidney disease: Secondary | ICD-10-CM | POA: Diagnosis not present

## 2019-10-08 DIAGNOSIS — E8779 Other fluid overload: Secondary | ICD-10-CM | POA: Diagnosis not present

## 2019-10-08 DIAGNOSIS — E8770 Fluid overload, unspecified: Secondary | ICD-10-CM | POA: Diagnosis not present

## 2019-10-08 DIAGNOSIS — D509 Iron deficiency anemia, unspecified: Secondary | ICD-10-CM | POA: Diagnosis not present

## 2019-10-08 DIAGNOSIS — N186 End stage renal disease: Secondary | ICD-10-CM | POA: Diagnosis not present

## 2019-10-08 DIAGNOSIS — E0822 Diabetes mellitus due to underlying condition with diabetic chronic kidney disease: Secondary | ICD-10-CM | POA: Diagnosis not present

## 2019-10-08 DIAGNOSIS — I959 Hypotension, unspecified: Secondary | ICD-10-CM | POA: Diagnosis not present

## 2019-10-08 DIAGNOSIS — Z87891 Personal history of nicotine dependence: Secondary | ICD-10-CM | POA: Diagnosis not present

## 2019-10-08 DIAGNOSIS — N2581 Secondary hyperparathyroidism of renal origin: Secondary | ICD-10-CM | POA: Diagnosis not present

## 2019-10-08 DIAGNOSIS — R197 Diarrhea, unspecified: Secondary | ICD-10-CM | POA: Diagnosis not present

## 2019-10-08 DIAGNOSIS — J449 Chronic obstructive pulmonary disease, unspecified: Secondary | ICD-10-CM | POA: Diagnosis not present

## 2019-10-08 DIAGNOSIS — Z992 Dependence on renal dialysis: Secondary | ICD-10-CM | POA: Diagnosis not present

## 2019-10-10 DIAGNOSIS — I959 Hypotension, unspecified: Secondary | ICD-10-CM | POA: Diagnosis not present

## 2019-10-10 DIAGNOSIS — E8770 Fluid overload, unspecified: Secondary | ICD-10-CM | POA: Diagnosis not present

## 2019-10-10 DIAGNOSIS — E0822 Diabetes mellitus due to underlying condition with diabetic chronic kidney disease: Secondary | ICD-10-CM | POA: Diagnosis not present

## 2019-10-10 DIAGNOSIS — Z87891 Personal history of nicotine dependence: Secondary | ICD-10-CM | POA: Diagnosis not present

## 2019-10-10 DIAGNOSIS — N2581 Secondary hyperparathyroidism of renal origin: Secondary | ICD-10-CM | POA: Diagnosis not present

## 2019-10-10 DIAGNOSIS — J449 Chronic obstructive pulmonary disease, unspecified: Secondary | ICD-10-CM | POA: Diagnosis not present

## 2019-10-10 DIAGNOSIS — E8779 Other fluid overload: Secondary | ICD-10-CM | POA: Diagnosis not present

## 2019-10-10 DIAGNOSIS — D631 Anemia in chronic kidney disease: Secondary | ICD-10-CM | POA: Diagnosis not present

## 2019-10-10 DIAGNOSIS — R52 Pain, unspecified: Secondary | ICD-10-CM | POA: Diagnosis not present

## 2019-10-10 DIAGNOSIS — Z992 Dependence on renal dialysis: Secondary | ICD-10-CM | POA: Diagnosis not present

## 2019-10-10 DIAGNOSIS — R197 Diarrhea, unspecified: Secondary | ICD-10-CM | POA: Diagnosis not present

## 2019-10-10 DIAGNOSIS — D509 Iron deficiency anemia, unspecified: Secondary | ICD-10-CM | POA: Diagnosis not present

## 2019-10-10 DIAGNOSIS — N186 End stage renal disease: Secondary | ICD-10-CM | POA: Diagnosis not present

## 2019-10-13 DIAGNOSIS — J449 Chronic obstructive pulmonary disease, unspecified: Secondary | ICD-10-CM | POA: Diagnosis not present

## 2019-10-13 DIAGNOSIS — E8779 Other fluid overload: Secondary | ICD-10-CM | POA: Diagnosis not present

## 2019-10-13 DIAGNOSIS — N2581 Secondary hyperparathyroidism of renal origin: Secondary | ICD-10-CM | POA: Diagnosis not present

## 2019-10-13 DIAGNOSIS — Z87891 Personal history of nicotine dependence: Secondary | ICD-10-CM | POA: Diagnosis not present

## 2019-10-13 DIAGNOSIS — R197 Diarrhea, unspecified: Secondary | ICD-10-CM | POA: Diagnosis not present

## 2019-10-13 DIAGNOSIS — E8770 Fluid overload, unspecified: Secondary | ICD-10-CM | POA: Diagnosis not present

## 2019-10-13 DIAGNOSIS — N186 End stage renal disease: Secondary | ICD-10-CM | POA: Diagnosis not present

## 2019-10-13 DIAGNOSIS — I959 Hypotension, unspecified: Secondary | ICD-10-CM | POA: Diagnosis not present

## 2019-10-13 DIAGNOSIS — E0822 Diabetes mellitus due to underlying condition with diabetic chronic kidney disease: Secondary | ICD-10-CM | POA: Diagnosis not present

## 2019-10-13 DIAGNOSIS — Z992 Dependence on renal dialysis: Secondary | ICD-10-CM | POA: Diagnosis not present

## 2019-10-13 DIAGNOSIS — R52 Pain, unspecified: Secondary | ICD-10-CM | POA: Diagnosis not present

## 2019-10-13 DIAGNOSIS — D509 Iron deficiency anemia, unspecified: Secondary | ICD-10-CM | POA: Diagnosis not present

## 2019-10-13 DIAGNOSIS — D631 Anemia in chronic kidney disease: Secondary | ICD-10-CM | POA: Diagnosis not present

## 2019-10-14 ENCOUNTER — Encounter (HOSPITAL_BASED_OUTPATIENT_CLINIC_OR_DEPARTMENT_OTHER): Payer: Medicare Other | Admitting: Physician Assistant

## 2019-10-14 ENCOUNTER — Other Ambulatory Visit: Payer: Self-pay

## 2019-10-14 DIAGNOSIS — I252 Old myocardial infarction: Secondary | ICD-10-CM | POA: Diagnosis not present

## 2019-10-14 DIAGNOSIS — N186 End stage renal disease: Secondary | ICD-10-CM | POA: Diagnosis not present

## 2019-10-14 DIAGNOSIS — L97812 Non-pressure chronic ulcer of other part of right lower leg with fat layer exposed: Secondary | ICD-10-CM | POA: Diagnosis not present

## 2019-10-14 DIAGNOSIS — L98492 Non-pressure chronic ulcer of skin of other sites with fat layer exposed: Secondary | ICD-10-CM | POA: Diagnosis not present

## 2019-10-14 DIAGNOSIS — I132 Hypertensive heart and chronic kidney disease with heart failure and with stage 5 chronic kidney disease, or end stage renal disease: Secondary | ICD-10-CM | POA: Diagnosis not present

## 2019-10-14 DIAGNOSIS — L97512 Non-pressure chronic ulcer of other part of right foot with fat layer exposed: Secondary | ICD-10-CM | POA: Diagnosis not present

## 2019-10-14 DIAGNOSIS — L97312 Non-pressure chronic ulcer of right ankle with fat layer exposed: Secondary | ICD-10-CM | POA: Diagnosis not present

## 2019-10-14 DIAGNOSIS — E1151 Type 2 diabetes mellitus with diabetic peripheral angiopathy without gangrene: Secondary | ICD-10-CM | POA: Diagnosis not present

## 2019-10-14 DIAGNOSIS — Z992 Dependence on renal dialysis: Secondary | ICD-10-CM | POA: Diagnosis not present

## 2019-10-14 DIAGNOSIS — Z794 Long term (current) use of insulin: Secondary | ICD-10-CM | POA: Diagnosis not present

## 2019-10-14 DIAGNOSIS — E1136 Type 2 diabetes mellitus with diabetic cataract: Secondary | ICD-10-CM | POA: Diagnosis not present

## 2019-10-14 DIAGNOSIS — T8781 Dehiscence of amputation stump: Secondary | ICD-10-CM | POA: Diagnosis not present

## 2019-10-14 DIAGNOSIS — E11622 Type 2 diabetes mellitus with other skin ulcer: Secondary | ICD-10-CM | POA: Diagnosis not present

## 2019-10-14 DIAGNOSIS — L97316 Non-pressure chronic ulcer of right ankle with bone involvement without evidence of necrosis: Secondary | ICD-10-CM | POA: Diagnosis not present

## 2019-10-14 DIAGNOSIS — E1122 Type 2 diabetes mellitus with diabetic chronic kidney disease: Secondary | ICD-10-CM | POA: Diagnosis not present

## 2019-10-14 DIAGNOSIS — I509 Heart failure, unspecified: Secondary | ICD-10-CM | POA: Diagnosis not present

## 2019-10-14 DIAGNOSIS — E11621 Type 2 diabetes mellitus with foot ulcer: Secondary | ICD-10-CM | POA: Diagnosis not present

## 2019-10-14 DIAGNOSIS — I872 Venous insufficiency (chronic) (peripheral): Secondary | ICD-10-CM | POA: Diagnosis not present

## 2019-10-14 DIAGNOSIS — J449 Chronic obstructive pulmonary disease, unspecified: Secondary | ICD-10-CM | POA: Diagnosis not present

## 2019-10-14 DIAGNOSIS — Z89512 Acquired absence of left leg below knee: Secondary | ICD-10-CM | POA: Diagnosis not present

## 2019-10-14 DIAGNOSIS — L89619 Pressure ulcer of right heel, unspecified stage: Secondary | ICD-10-CM | POA: Diagnosis not present

## 2019-10-14 DIAGNOSIS — L98412 Non-pressure chronic ulcer of buttock with fat layer exposed: Secondary | ICD-10-CM | POA: Diagnosis not present

## 2019-10-14 NOTE — Progress Notes (Signed)
LORAIN, WHORTON ([id number]) Visit Report for 10/14/2019 Chief Complaint Document Details Patient Name: Date of Service: Jeffrey [name], Jeffrey [name] 10/14/2019 10:45 AM Medical Record [id number] Patient Account Number: [id number] Date of Birth/Sex: Treating RN: [birth year] (78 y.o. [name] Primary Care Provider: [name] Other Clinician: Referring Provider: Treating Provider/Extender:[name], [name], [name]: 14 Information Obtained from: Patient Chief Complaint Bilateral LE Ulcers Electronic Signature(s) Signed: 10/14/2019 5:11:42 PM By: Worthy Keeler PA-C Entered By: Worthy Keeler on 10/14/2019 11:27:40 -------------------------------------------------------------------------------- HPI Details Patient Name: Date of Service: Jeffrey [name], Jeffrey [name] 10/14/2019 10:45 AM Medical Record [id number] Patient Account Number: [id number] Date of Birth/Sex: Treating RN: 04-Jul-1941 (78 y.o. [name] Primary Care Provider: [name] Other Clinician: Referring Provider: Treating Provider/Extender:[name], [name], [name]: 14 History of Present Illness HPI Description: 07/08/2019 on evaluation today patient presents for initial evaluation in clinic concerning issues that began about 3 weeks ago he states. He recently underwent a left BKA to the under the care of Dr. Doren Custard and this was on Apr 14, 2019. Subsequently he developed an area of what appears to be abrasion just below this amputation site which they feel occurred as a result of utilization of the shrinker. With that being said the good news is this does not really appear to be doing too poorly at all. The bigger issue is that he has multiple ulcers of the right lower extremity where 2 of the areas appear to be more blisters and in fact 1 of them we did not even count as it does not appear to be an open wound at all. The other site is on the  lateral malleolus and I believe this may be not just venous related but potentially some pressure component to it as well. Nonetheless it does sound like he sleeps in his chair with his leg up on a stool and subsequently this causes some pressure to the lateral portion of his ankle as it does press on the stool his wife tells me. He is not able to lay flat such as in the bed or even reclined back in his recliner due to issues with his breathing. 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 He also has congestive heart failure. Currently they have been using meta honey on the wounds until they ran out prior to coming here. His most recent hemoglobin A1c at dialysis was 7.2. 07/15/2019 upon evaluation today patient appears to be doing overall quite poorly well with regard to his left distal stump ulcer. This has healed they have not been using the shrinker and fortunately this healed quite nicely. She did not try the foam underneath the shrinker she was afraid that it would cause too much moisture. This is his daughter. Subsequently in regard to the right lower extremity patient actually has been having to change this twice a day most days. Unfortunately he has a lot of drainage and the dressings that are putting on just do not seem to be lasting. Nonetheless I feel like we may need to initiate some kind of compression although we have to be very cautious with this based on his arterial study which shows that he does have a TBI of 0.43 this is not in the best range obviously. 07/22/2019 on evaluation today  patient appears to be doing decently well with regard to his wounds. I do not see any evidence of active infection at this time which is good news. Fortunately there is no signs of  worsening and in fact his wounds actually appear to be doing better which is great news. No fevers, chills, nausea, vomiting, or diarrhea. 07/29/19 on evaluation today patient actually appears to be doing quite well with regard to his wounds currently on the right lateral leg and right medial leg. With that being said the right lateral malleolus did have some Slough noted upon evaluation today this is gonna need to be addressed by way of sharp debridement. With that being said I feel like that we can appropriately debride this without complication which is good news. He has some discomfort but nothing too significant. 08/05/2019 on evaluation today patient actually appears to be doing quite well with regard to his left medial lower extremity ulcer and his lateral lower extremity ulcer is completely healed. With that being said he is having some issues still with the right lateral ankle I think this is simply due to the fact that he is constantly putting pressure on this when he sleeping at night or even when he is in the dialysis chair for that reason during the day. Nonetheless I believe that he would benefit from having more appropriate offloading possibly even a Prevalon boot could be of benefit to him. 08/12/2019 on evaluation today patient appears to be doing better in regard to his right lower extremity ulcers in general which is good news. Unfortunately in regard to his left lower extremity he appears to be doing more poorly today at this time. In fact the area that we were concerned about we were trying to protect last week is opened up into a true open wound from the blister that is shown up. He also has a second blister on the distal aspect of his stump which is open at this point in time just draining a little bit but not completely cleared away as far as the blistered skin is concerned. Subsequently we will get to keep an eye on this that may need to be removed or come off itself by next  week we shall see. 08/26/2019 on evaluation today patient actually was in the hospital for a period of time from 08/14/2019 through 08/20/2019. Unfortunately he had several episodes of shortness of breath with intermittent chest pain and altered mental status with decreased level of consciousness and EMS was called. He was evaluated in the hospital where throughout the hospital course he had multiple issues including some trouble with NSVT but no persistent SVT. Cardiology consider cardioversion at one point but then did not. Apparently wound care did see him for his ulcers on his lower extremities although the patient states he really was not impressed with what they were doing. He does have follow-up appointments with cardiology upon discharge as well as continuing with dialysis and seen his primary care provider in 1 to 2 weeks due to his T3 and T4 levels since he did have an elevated TSH in the hospital. His wounds today appear to be doing fairly well and in fact the left stump wounds are dramatically improved compared to when I last saw him. On the right the original wounds seem to be doing slightly better although he does have a new wound on his toe unfortunately. This is the right second toe. 09/02/2019 on evaluation today patient appears to be doing roughly about  the same in regard to his wounds in general. He has not really made significant improvements but all in all things appear to be fairly stable which is good news. Unfortunately he was in dialysis when his blood pressure and oxygen saturation dropped recently he ended up going to the hospital. He was seen in the emergency department where on 08/29/2019 he was told by the physician who was caring for him and his wife was told as well that they should call hospice in as he was not likely to live much longer. Nonetheless that is something that his wife and daughter state they do not want to do obviously under hospice he tells me the biggest  issue is he is not allowed to have dialysis which again would lead to his imminent demise. Nonetheless at this point he wants to try it as much as possible enjoy the remaining time that he has with his family which I can respect. In the meantime were trying to manage his wounds as much as we can and as well as we can though again I am not sure how likely he will be to heal everything. 09/09/2019 on evaluation today patient appears to be doing better in my opinion in regard to pre-much all of his wounds on the lower extremity. There does not appear to be any signs of active infection at this time which is good news. No fevers, chills, nausea, vomiting, or diarrhea. Overall I think that things are doing about as well as we can expect at this point. He did have a lot of fluid pulled off today and his right arm is extremely swollen that is uncomfortable for him he is most concerned about that at this time. 09/16/2019 on evaluation today patient appears to be doing well with regard to some of his wounds although he has new wounds in other locations. This includes his right third toe which unfortunately is cracked underneath and appears to be bleeding quite proficiently. We were able to get this stopped today but nonetheless that is definitely not a good sign I am not sure exactly what happened here he really does not know either. He also has new wounds on the gluteal region and the perineum specifically as well as the ischium. Unfortunately these are quite painful at this point. Overall again he seems to be doing better in some regards and worse than others. 09/30/2019 on evaluation today patient actually appears to be doing fairly well with regard to his wounds. He has been tolerating the dressing changes without complication. Most of the wounds are measuring at least the same if not a little better across the board and in regard to his toes they seem to be drying up quite a bit and the area underneath  where he was bleeding so much on the plantar aspect of his toe actually is not bleeding as much today though I was very cautious not to pull or try to stretch it too much as that obviously would likely start the bleeding all over again. 10/07/2019 on evaluation today patient actually appears to be doing a little better in regard to most of his wounds at this point in my opinion. He has been tolerating the dressing changes without complication. Fortunately there is no signs of active infection at this point. No fevers, chills, nausea, vomiting, or diarrhea. 10/14/2019 on evaluation today patient appears to be doing well without complications. Fortunately there is no signs of overall significant worsening of his wounds although he does  not seem to be showing quite as much improvement as obviously we like to see but again the main goal is to try to keep things under control not necessarily complete resolution of all of his wounds. The worst areas in the perianal region where he does have a wound will using zinc over this but obviously that is very painful for him. He does tell me that he is having chest pains today for which she is taking nitro this is something that happens very frequently. He does see his doctors regularly as well. Obviously he knows if he gets worse to go to the ER ASAP. Electronic Signature(s) Signed: 10/14/2019 5:11:42 PM By: Worthy Keeler PA-C Entered By: Worthy Keeler on 10/14/2019 12:09:08 -------------------------------------------------------------------------------- Physical Exam Details Patient Name: Date of Service: Jeffrey [name], Jeffrey [name] 10/14/2019 10:45 AM Medical Record [id number] Patient Account Number: [id number] Date of Birth/Sex: Treating RN: February 17, 1941 (78 y.o. [name] Primary Care Provider: [name] Other Clinician: Referring Provider: Treating Provider/Extender:[name], [name], [name]:  34 Constitutional Well-nourished and well-hydrated in no acute distress. Respiratory normal breathing without difficulty. clear to auscultation bilaterally. Cardiovascular regular rate and rhythm with normal S1, S2. Psychiatric this patient is able to make decisions and demonstrates good insight into disease process. Alert and Oriented x 3. pleasant and cooperative. Notes Patient's wound bed currently showed signs of in most locations maintaining in some areas like the medial ankle region it is doing a little better although for the most part were just maintaining at this point. Fortunately there is no evidence of active infection currently which is good news. He does have bone exposed in the base of the ankle ulcer unfortunately. Electronic Signature(s) Signed: 10/14/2019 5:11:42 PM By: Worthy Keeler PA-C Entered By: Worthy Keeler on 10/14/2019 12:10:18 -------------------------------------------------------------------------------- Physician Orders Details Patient Name: Date of Service: Jeffrey [name], Jeffrey [name] 10/14/2019 10:45 AM Medical Record [id number] Patient Account Number: [id number] Date of Birth/Sex: Treating RN: 01/26/1941 (78 y.o. [name] Primary Care Provider: [name] Other Clinician: Referring Provider: Treating Provider/Extender:[name], [name], [name]: 72 Verbal / Phone Orders: No Diagnosis Coding ICD-10 Coding Code Description I87.2 Venous insufficiency (chronic) (peripheral) I73.9 Peripheral vascular disease, unspecified L97.312 Non-pressure chronic ulcer of right ankle with fat layer exposed L97.812 Non-pressure chronic ulcer of other part of right lower leg with fat layer exposed Z89.512 Acquired absence of left leg below knee T81.31XA Disruption of external operation (surgical) wound, not elsewhere classified, initial encounter E11.622 Type 2 diabetes mellitus with other skin ulcer N18.6 End stage renal  disease Z99.2 Dependence on renal dialysis Follow-up Appointments Return Appointment in 2 weeks. - with Margarita Grizzle Nurse Visit: - 1 week Dressing Change Frequency Wound #1 Right,Lateral Malleolus Do not change entire dressing for one week. - all right leg wounds Wound #11 Right Calcaneus Do not change entire dressing for one week. - all right leg wounds Wound #10 Right,Plantar Toe Third Change Dressing every other day. Wound #7 Right Toe Second Change Dressing every other day. Wound #9 Perineum Change dressing every day. Skin Barriers/Peri-Wound Care Moisturizing lotion - to leg and foot under wrap Wound Cleansing Wound #1 Right,Lateral Malleolus May shower with protection. Wound #11 Right Calcaneus May shower with protection. Wound #10 Right,Plantar Toe Third Clean wound with Wound Cleanser Wound #7 Right Toe Second Clean wound with Wound Cleanser Wound #9 Perineum Clean wound with Wound Cleanser Primary Wound Dressing Wound #1 Right,Lateral Malleolus Calcium Alginate with Silver Wound #11  Right Calcaneus Calcium Alginate with Silver Wound #10 Right,Plantar Toe Third Calcium Alginate with Silver Wound #4 Right,Medial Lower Leg Calcium Alginate with Silver Wound #7 Right Toe Second Calcium Alginate with Silver Wound #9 Perineum Other: - Thick layer of zinc oxide paste (barrier paste/cream) Secondary Dressing Wound #1 Right,Lateral Malleolus Dry Gauze Wound #10 Right,Plantar Toe Third Kerlix/Rolled Gauze Dry Gauze Wound #4 Right,Medial Lower Leg Dry Gauze ABD pad Wound #7 Right Toe Second Kerlix/Rolled Gauze Dry Gauze Wound #11 Right Calcaneus Foam - to cushion Dry Gauze Edema Control Kerlix and Coban - Right Lower Extremity - do not wrap too tight Elevate legs to the level of the heart or above for 30 minutes daily and/or when sitting, a frequency of: - throughout the day Other: - stump shrinker daily Off-Loading Other: - do not let ankle roll out to the side,  no pressure on right lateral ankle, use piece of thick foam with hole cut out for ankle especially at night [place] [facility] [place] [facility] [place] Signature(s) Signed: 10/14/2019 5:11:42 PM By: Worthy Keeler PA-C Signed: 10/14/2019 5:42:21 PM By: Baruch Gouty RN, BSN Entered By: Baruch Gouty on 10/14/2019 12:09:22 -------------------------------------------------------------------------------- Problem List Details Patient Name: Date of Service: Jeffrey [name], Jeffrey [name] 10/14/2019 10:45 AM Medical Record [id number] Patient Account Number: [id number] Date of Birth/Sex: Treating RN: 1941/03/23 (78 y.o. [name] Primary Care Provider: [name] Other Clinician: Referring Provider: Treating Provider/Extender:[name], [name], [name]: 14 Active Problems ICD-10 Evaluated Encounter Code Description Active Date Today Diagnosis I87.2 Venous insufficiency (chronic) (peripheral) 07/08/2019 No Yes I73.9 Peripheral vascular disease, unspecified 07/08/2019 No Yes L97.312 Non-pressure chronic ulcer of right ankle with fat layer 07/08/2019 No Yes exposed L97.812 Non-pressure chronic ulcer of other part of right lower 07/08/2019 No Yes leg with fat layer exposed Z89.512 Acquired absence of left leg below knee 07/08/2019 No Yes T81.31XA Disruption of external operation (surgical) wound, not 07/08/2019 No Yes elsewhere classified, initial encounter E11.622 Type 2 diabetes mellitus with other skin ulcer 07/08/2019 No Yes N18.6 End stage renal disease 07/08/2019 No Yes Z99.2 Dependence on renal dialysis 07/08/2019 No Yes Inactive Problems Resolved Problems Electronic Signature(s) Signed: 10/14/2019 5:11:42 PM By: Worthy Keeler PA-C Entered By: Worthy Keeler on 10/14/2019 11:27:36 -------------------------------------------------------------------------------- Progress Note Details Patient Name: Date of Service: Jeffrey [name], Jeffrey [name] 10/14/2019 10:45 AM Medical Record [id number] Patient Account Number: [id number] Date of Birth/Sex: Treating RN: [birth year] (78 y.o. [name] Primary Care Provider: [name] Other Clinician: Referring Provider: Treating Provider/Extender:[name], [name], [name]: 14 Subjective Chief Complaint Information obtained from Patient Bilateral LE Ulcers History of Present Illness (HPI) 07/08/2019 on evaluation today patient presents for initial evaluation in clinic concerning issues that began about 3 weeks ago he states. He recently underwent a left BKA to the under the care of Dr. Doren Custard and this was on Apr 14, 2019. Subsequently he developed an area of what appears to be abrasion just below this amputation site which they feel occurred as a result of utilization of the shrinker. With that being said the good news is this does not really appear to be doing too poorly at all. The bigger issue is that he has multiple ulcers of the right lower extremity where 2 of the areas appear to be more blisters and in fact 1 of them we did not even count as it does not appear to be an open wound at all. The other site  is on the lateral malleolus and I believe this may be not just venous related but potentially some pressure component to it as well. Nonetheless it does sound like he sleeps in his chair with his leg up on a stool and subsequently this causes some pressure to the lateral portion of his ankle as it does press on the stool his wife tells me. He is not able to lay flat such as in the bed or even reclined back in his recliner due to issues with his breathing. 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 He also has congestive heart  failure. Currently they have been using meta honey on the wounds until they ran out prior to coming here. His most recent hemoglobin A1c at dialysis was 7.2. 07/15/2019 upon evaluation today patient appears to be doing overall quite poorly well with regard to his left distal stump ulcer. This has healed they have not been using the shrinker and fortunately this healed quite nicely. She did not try the foam underneath the shrinker she was afraid that it would cause too much moisture. This is his daughter. Subsequently in regard to the right lower extremity patient actually has been having to change this twice a day most days. Unfortunately he has a lot of drainage and the dressings that are putting on just do not seem to be lasting. Nonetheless I feel like we may need to initiate some kind of compression although we have to be very cautious with this based on his arterial study which shows that he does have a TBI of 0.43 this is not in the best range obviously. 07/22/2019 on evaluation today patient appears to be doing decently well with regard to his wounds. I do not see any evidence of active infection at this time which is good news. Fortunately there is no signs of worsening and in fact his wounds actually appear to be doing better which is great news. No fevers, chills, nausea, vomiting, or diarrhea. 07/29/19 on evaluation today patient actually appears to be doing quite well with regard to his wounds currently on the right lateral leg and right medial leg. With that being said the right lateral malleolus did have some Slough noted upon evaluation today this is gonna need to be addressed by way of sharp debridement. With that being said I feel like that we can appropriately debride this without complication which is good news. He has some discomfort but nothing too significant. 08/05/2019 on evaluation today patient actually appears to be doing quite well with regard to his left medial  lower extremity ulcer and his lateral lower extremity ulcer is completely healed. With that being said he is having some issues still with the right lateral ankle I think this is simply due to the fact that he is constantly putting pressure on this when he sleeping at night or even when he is in the dialysis chair for that reason during the day. Nonetheless I believe that he would benefit from having more appropriate offloading possibly even a Prevalon boot could be of benefit to him. 08/12/2019 on evaluation today patient appears to be doing better in regard to his right lower extremity ulcers in general which is good news. Unfortunately in regard to his left lower extremity he appears to be doing more poorly today  at this time. In fact the area that we were concerned about we were trying to protect last week is opened up into a true open wound from the blister that is shown up. He also has a second blister on the distal aspect of his stump which is open at this point in time just draining a little bit but not completely cleared away as far as the blistered skin is concerned. Subsequently we will get to keep an eye on this that may need to be removed or come off itself by next week we shall see. 08/26/2019 on evaluation today patient actually was in the hospital for a period of time from 08/14/2019 through 08/20/2019. Unfortunately he had several episodes of shortness of breath with intermittent chest pain and altered mental status with decreased level of consciousness and EMS was called. He was evaluated in the hospital where throughout the hospital course he had multiple issues including some trouble with NSVT but no persistent SVT. Cardiology consider cardioversion at one point but then did not. Apparently wound care did see him for his ulcers on his lower extremities although the patient states he really was not impressed with what they were doing. He does have follow-up appointments with  cardiology upon discharge as well as continuing with dialysis and seen his primary care provider in 1 to 2 weeks due to his T3 and T4 levels since he did have an elevated TSH in the hospital. His wounds today appear to be doing fairly well and in fact the left stump wounds are dramatically improved compared to when I last saw him. On the right the original wounds seem to be doing slightly better although he does have a new wound on his toe unfortunately. This is the right second toe. 09/02/2019 on evaluation today patient appears to be doing roughly about the same in regard to his wounds in general. He has not really made significant improvements but all in all things appear to be fairly stable which is good news. Unfortunately he was in dialysis when his blood pressure and oxygen saturation dropped recently he ended up going to the hospital. He was seen in the emergency department where on 08/29/2019 he was told by the physician who was caring for him and his wife was told as well that they should call hospice in as he was not likely to live much longer. Nonetheless that is something that his wife and daughter state they do not want to do obviously under hospice he tells me the biggest issue is he is not allowed to have dialysis which again would lead to his imminent demise. Nonetheless at this point he wants to try it as much as possible enjoy the remaining time that he has with his family which I can respect. In the meantime were trying to manage his wounds as much as we can and as well as we can though again I am not sure how likely he will be to heal everything. 09/09/2019 on evaluation today patient appears to be doing better in my opinion in regard to pre-much all of his wounds on the lower extremity. There does not appear to be any signs of active infection at this time which is good news. No fevers, chills, nausea, vomiting, or diarrhea. Overall I think that things are doing about as well as  we can expect at this point. He did have a lot of fluid pulled off today and his right arm is extremely swollen that is uncomfortable for  him he is most concerned about that at this time. 09/16/2019 on evaluation today patient appears to be doing well with regard to some of his wounds although he has new wounds in other locations. This includes his right third toe which unfortunately is cracked underneath and appears to be bleeding quite proficiently. We were able to get this stopped today but nonetheless that is definitely not a good sign I am not sure exactly what happened here he really does not know either. He also has new wounds on the gluteal region and the perineum specifically as well as the ischium. Unfortunately these are quite painful at this point. Overall again he seems to be doing better in some regards and worse than others. 09/30/2019 on evaluation today patient actually appears to be doing fairly well with regard to his wounds. He has been tolerating the dressing changes without complication. Most of the wounds are measuring at least the same if not a little better across the board and in regard to his toes they seem to be drying up quite a bit and the area underneath where he was bleeding so much on the plantar aspect of his toe actually is not bleeding as much today though I was very cautious not to pull or try to stretch it too much as that obviously would likely start the bleeding all over again. 10/07/2019 on evaluation today patient actually appears to be doing a little better in regard to most of his wounds at this point in my opinion. He has been tolerating the dressing changes without complication. Fortunately there is no signs of active infection at this point. No fevers, chills, nausea, vomiting, or diarrhea. 10/14/2019 on evaluation today patient appears to be doing well without complications. Fortunately there is no signs of overall significant worsening of his wounds  although he does not seem to be showing quite as much improvement as obviously we like to see but again the main goal is to try to keep things under control not necessarily complete resolution of all of his wounds. The worst areas in the perianal region where he does have a wound will using zinc over this but obviously that is very painful for him. He does tell me that he is having chest pains today for which she is taking nitro this is something that happens very frequently. He does see his doctors regularly as well. Obviously he knows if he gets worse to go to the ER ASAP. Patient History Unable to Obtain Patient History due to Altered Mental Status. Information obtained from Patient. Family History Diabetes - Father, No family history of Cancer, Heart Disease, Hereditary Spherocytosis, Hypertension, Kidney Disease, Lung Disease, Seizures, Stroke, Thyroid Problems, Tuberculosis. Social History Former smoker, Marital Status - Married, Alcohol Use - Rarely, Drug Use - No History, Caffeine Use - Never. Medical History Eyes Patient has history of Cataracts Denies history of Glaucoma, Optic Neuritis Ear/Nose/Mouth/Throat Denies history of Chronic sinus problems/congestion, Middle ear problems Hematologic/Lymphatic Denies history of Anemia, Hemophilia, Human Immunodeficiency Virus, Lymphedema, Sickle Cell Disease Respiratory Patient has history of Chronic Obstructive Pulmonary Disease (COPD) Denies history of Aspiration, Asthma, Pneumothorax, Sleep Apnea, Tuberculosis Cardiovascular Patient has history of Congestive Heart Failure, Hypertension, Myocardial Infarction, Peripheral Venous Disease Denies history of Angina, Arrhythmia, Coronary Artery Disease, Deep Vein Thrombosis, Hypotension, Peripheral Arterial Disease, Phlebitis, Vasculitis Gastrointestinal Denies history of Cirrhosis , Colitis, Crohnoos, Hepatitis A, Hepatitis B, Hepatitis C Endocrine Patient has history of Type II  Diabetes Denies history of Type I Diabetes  Genitourinary Patient has history of End Stage Renal Disease Immunological Denies history of Lupus Erythematosus, Raynaudoos, Scleroderma Integumentary (Skin) Denies history of History of Burn Musculoskeletal Patient has history of Osteomyelitis - left foot Denies history of Gout, Rheumatoid Arthritis, Osteoarthritis Neurologic Denies history of Dementia, Neuropathy, Quadriplegia, Paraplegia, Seizure Disorder Oncologic Denies history of Received Chemotherapy, Received Radiation Psychiatric Denies history of Anorexia/bulimia, Confinement Anxiety Hospitalization/Surgery History - 08/14/2019 [place]- faint/ decreased LOC/ chest pain. Medical And Surgical History Notes Genitourinary dialysis Review of Systems (ROS) Constitutional Symptoms (General Health) Denies complaints or symptoms of Fatigue, Fever, Chills, Marked Weight Change. Respiratory Denies complaints or symptoms of Chronic or frequent coughs, Shortness of Breath. Cardiovascular Denies complaints or symptoms of Chest pain. Psychiatric Denies complaints or symptoms of Claustrophobia, Suicidal. Objective Constitutional Well-nourished and well-hydrated in no acute distress. Vitals Time Taken: 11:14 AM, Height: 71 in, Weight: 209 lbs, BMI: 29.1, Temperature: 98.0 F, Pulse: 54 bpm, Respiratory Rate: 18 breaths/min, Blood Pressure: 92/45 mmHg, Capillary Blood Glucose: 147 mg/dl. General Notes: glucose per pt report Respiratory normal breathing without difficulty. clear to auscultation bilaterally. Cardiovascular regular rate and rhythm with normal S1, S2. Psychiatric this patient is able to make decisions and demonstrates good insight into disease process. Alert and Oriented x 3. pleasant and cooperative. General Notes: Patient's wound bed currently showed signs of in most locations maintaining in some areas like the medial ankle region it is doing a little better although  for the most part were just maintaining at this point. Fortunately there is no evidence of active infection currently which is good news. He does have bone exposed in the base of the ankle ulcer unfortunately. Integumentary (Hair, Skin) Wound #1 status is Open. Original cause of wound was Gradually Appeared. The wound is located on the Right,Lateral Malleolus. The wound measures 0.8cm length x 0.8cm width x 0.4cm depth; 0.503cm^2 area and 0.201cm^3 volume. There is bone and Fat Layer (Subcutaneous Tissue) Exposed exposed. There is no tunneling or undermining noted. There is a medium amount of serosanguineous drainage noted. The wound margin is distinct with the outline attached to the wound base. There is small (1-33%) pink granulation within the wound bed. There is a large (67-100%) amount of necrotic tissue within the wound bed including Adherent Slough. Wound #10 status is Open. Original cause of wound was Gradually Appeared. The wound is located on the Right,Plantar Toe Third. The wound measures 1.5cm length x 1.5cm width x 0.1cm depth; 1.767cm^2 area and 0.177cm^3 volume. There is Fat Layer (Subcutaneous Tissue) Exposed exposed. There is no tunneling or undermining noted. There is a medium amount of serosanguineous drainage noted. The wound margin is flat and intact. There is no granulation within the wound bed. There is a large (67-100%) amount of necrotic tissue within the wound bed including Adherent Slough. Wound #11 status is Open. Original cause of wound was Pressure Injury. The wound is located on the Right Calcaneus. The wound measures 0.7cm length x 1.4cm width x 0.2cm depth; 0.77cm^2 area and 0.154cm^3 volume. There is no tunneling or undermining noted. There is a medium amount of serosanguineous drainage noted. The wound margin is flat and intact. There is no granulation within the wound bed. There is a large (67-100%) amount of necrotic tissue within the wound bed including  Adherent Slough. Wound #4 status is Open. Original cause of wound was Blister. The wound is located on the Right,Medial Lower Leg. The wound measures 2.4cm length x 2.8cm width x 0.1cm depth; 5.278cm^2 area and 0.528cm^3  volume. There is Fat Layer (Subcutaneous Tissue) Exposed exposed. There is no tunneling or undermining noted. There is a medium amount of serosanguineous drainage noted. The wound margin is distinct with the outline attached to the wound base. There is medium (34-66%) pink granulation within the wound bed. There is a medium (34-66%) amount of necrotic tissue within the wound bed including Adherent Slough. Wound #7 status is Open. Original cause of wound was Gradually Appeared. The wound is located on the Right Toe Second. The wound measures 1.5cm length x 1.8cm width x 0.1cm depth; 2.121cm^2 area and 0.212cm^3 volume. There is Fat Layer (Subcutaneous Tissue) Exposed exposed. There is no tunneling or undermining noted. There is a small amount of serosanguineous drainage noted. The wound margin is distinct with the outline attached to the wound base. There is no granulation within the wound bed. There is a large (67-100%) amount of necrotic tissue within the wound bed including Eschar and Adherent Slough. Wound #8 status is Healed - Epithelialized. Original cause of wound was Gradually Appeared. The wound is located on the Left Ischium. The wound measures 0cm length x 0cm width x 0cm depth; 0cm^2 area and 0cm^3 volume. There is no tunneling or undermining noted. There is a none present amount of drainage noted. The wound margin is flat and intact. There is no granulation within the wound bed. There is no necrotic tissue within the wound bed. Wound #9 status is Open. Original cause of wound was Gradually Appeared. The wound is located on the Perineum. The wound measures 1.5cm length x 0.4cm width x 0.1cm depth; 0.471cm^2 area and 0.047cm^3 volume. There is Fat Layer (Subcutaneous  Tissue) Exposed exposed. There is no tunneling or undermining noted. There is a medium amount of serosanguineous drainage noted. The wound margin is flat and intact. There is large (67-100%) red granulation within the wound bed. There is no necrotic tissue within the wound bed. 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 - with Margarita Grizzle Nurse Visit: - 1 week Dressing Change Frequency: Wound #1 Right,Lateral Malleolus: Do not change entire dressing for one week. - all right leg wounds Wound #11 Right Calcaneus: Do not change entire dressing for one week. - all right leg wounds Wound #10 Right,Plantar Toe Third: Change Dressing every other day. Wound #7 Right Toe Second: Change Dressing every other day. Wound #9 Perineum: Change dressing every day. Skin Barriers/Peri-Wound Care: Moisturizing lotion - to leg and foot under wrap Wound Cleansing: Wound #1 Right,Lateral Malleolus: May shower with protection. Wound #11 Right Calcaneus: May shower with protection. Wound #10 Right,Plantar Toe Third: Clean wound with Wound Cleanser Wound #7 Right Toe Second: Clean wound with Wound Cleanser Wound #9 Perineum: Clean wound with Wound Cleanser Primary Wound Dressing: Wound #1 Right,Lateral Malleolus: Calcium Alginate with Silver Wound #11 Right Calcaneus: Calcium Alginate with Silver Wound #10 Right,Plantar Toe Third: Calcium Alginate with Silver Wound #4 Right,Medial Lower  Leg: Calcium Alginate with Silver Wound #7 Right Toe Second: Calcium Alginate with Silver Wound #9 Perineum: Other: - Thick layer of zinc oxide paste (barrier paste/cream) Secondary Dressing: Wound #1  Right,Lateral Malleolus: Dry Gauze Wound #10 Right,Plantar Toe Third: Kerlix/Rolled Gauze Dry Gauze Wound #4 Right,Medial Lower Leg: Dry Gauze ABD pad Wound #7 Right Toe Second: Kerlix/Rolled Gauze Dry Gauze Wound #11 Right Calcaneus: Foam - to cushion Dry Gauze Edema Control: Kerlix and Coban - Right Lower Extremity - do not wrap too tight Elevate legs to the level of the heart or above for 30 minutes daily and/or when sitting, a frequency of: - throughout the day Other: - stump shrinker daily Off-Loading: Other: - do not let ankle roll out to the side, no pressure on right lateral ankle, use piece of thick foam with hole cut out for ankle especially at night Home Health: Admit to [facility] 1. My suggestion at this time is good to be that we go ahead and continue to monitor the patient situation as we have been doing. Obviously our main goal here is to maintain and try to prevent severe infection not necessarily to be able to heal these wounds especially in light of the poor arterial flow into the right lower extremity. 2. We will continue with silver alginate dressings to all wound locations. 3. I think is far as the pressure is concerned getting to his right leg I do recommend he try to elevate this and protect it from being pushed on by anything such as a stool or even in the bed although I do not think he sleeps in the bed still at this point. 4. Were trying to get him into home health with regard to dressing changes but so far have not been able to do this. He qualifies for hospice care but unfortunately they would not allow him to continue with his dialysis as such. For that reason they have declined hospice care. We will see patient back for  reevaluation in 2 weeks here in the clinic. If anything worsens or changes patient will contact our office for additional recommendations. . Electronic Signature(s) Signed: 10/14/2019 5:11:42 PM By: Worthy Keeler PA-C Entered By: Worthy Keeler on 10/14/2019 12:11:41 -------------------------------------------------------------------------------- HxROS Details Patient Name: Date of Service: Jeffrey [name], Jeffrey [name] 10/14/2019 10:45 AM Medical Record [id number] Patient Account Number: [id number] Date of Birth/Sex: Treating RN: 08-02-1941 (78 y.o. [name] Primary Care Provider: [name] Other Clinician: Referring Provider: Treating Provider/Extender:[name], [name], [name]: 51 Unable to Obtain Patient History due to Altered Mental Status Information Obtained From Patient Constitutional Symptoms (General Health) Complaints and Symptoms: Negative for: Fatigue; Fever; Chills; Marked Weight Change Respiratory Complaints and Symptoms: Negative for: Chronic or frequent coughs; Shortness of Breath Medical History: Positive for: Chronic Obstructive Pulmonary Disease (COPD) Negative for: Aspiration; Asthma; Pneumothorax; Sleep Apnea; Tuberculosis Cardiovascular Complaints and Symptoms: Negative for: Chest pain Medical History: Positive for: Congestive Heart Failure; Hypertension; Myocardial Infarction; Peripheral Venous Disease Negative for: Angina; Arrhythmia; Coronary Artery Disease; Deep Vein Thrombosis; Hypotension; Peripheral Arterial Disease; Phlebitis; Vasculitis Psychiatric Complaints and Symptoms: Negative for: Claustrophobia; Suicidal Medical History: Negative for: Anorexia/bulimia; Confinement Anxiety Eyes Medical History: Positive for: Cataracts Negative for: Glaucoma; Optic Neuritis Ear/Nose/Mouth/Throat Medical History: Negative for: Chronic sinus problems/congestion; Middle ear  problems Hematologic/Lymphatic Medical History: Negative for: Anemia; Hemophilia; Human Immunodeficiency Virus; Lymphedema; Sickle Cell Disease Gastrointestinal Medical History: Negative for: Cirrhosis ; Colitis; Crohns; Hepatitis A; Hepatitis B; Hepatitis C Endocrine Medical History: Positive for: Type II Diabetes Negative for: Type I Diabetes Time with diabetes: 6 Treated with: Insulin Blood sugar tested every day: No Genitourinary Medical History: Positive for: End  Stage Renal Disease Past Medical History Notes: dialysis Immunological Medical History: Negative for: Lupus Erythematosus; Raynauds; Scleroderma Integumentary (Skin) Medical History: Negative for: History of Burn Musculoskeletal Medical History: Positive for: Osteomyelitis - left foot Negative for: Gout; Rheumatoid Arthritis; Osteoarthritis Neurologic Medical History: Negative for: Dementia; Neuropathy; Quadriplegia; Paraplegia; Seizure Disorder Oncologic Medical History: Negative for: Received Chemotherapy; Received Radiation HBO Extended History Items Eyes: Cataracts Immunizations Pneumococcal Vaccine: Received Pneumococcal Vaccination: No Implantable Devices Yes Hospitalization / Surgery History Type of Hospitalization/Surgery 08/14/2019 [place]- faint/ decreased LOC/ chest pain Family and Social History Cancer: No; Diabetes: Yes - Father; Heart Disease: No; Hereditary Spherocytosis: No; Hypertension: No; Kidney Disease: No; Lung Disease: No; Seizures: No; Stroke: No; Thyroid Problems: No; Tuberculosis: No; Former smoker; Marital Status - Married; Alcohol Use: Rarely; Drug Use: No History; Caffeine Use: Never; Financial Concerns: No; Food, Clothing or Shelter Needs: No; Support System Lacking: No; Transportation Concerns: No Physician Affirmation I have reviewed and agree with the above information. Electronic Signature(s) Signed: 10/14/2019 5:11:42 PM By: Worthy Keeler PA-C Signed:  10/14/2019 5:42:21 PM By: Baruch Gouty RN, BSN Entered By: Worthy Keeler on 10/14/2019 12:09:33 -------------------------------------------------------------------------------- SuperBill Details Patient Name: Date of Service: NHAT, LEIBMAN 10/14/2019 Medical Record [id number] Patient Account Number: [id number] Date of Birth/Sex: Treating RN: Aug 13, 1941 (78 y.o. [name] Primary Care Provider: [name] Other Clinician: Referring Provider: Treating Provider/Extender:[name], [name], [name]: 14 Diagnosis Coding ICD-10 Codes Code Description I87.2 Venous insufficiency (chronic) (peripheral) I73.9 Peripheral vascular disease, unspecified L97.312 Non-pressure chronic ulcer of right ankle with fat layer exposed L97.812 Non-pressure chronic ulcer of other part of right lower leg with fat layer exposed Z89.512 Acquired absence of left leg below knee T81.31XA Disruption of external operation (surgical) wound, not elsewhere classified, initial encounter E11.622 Type 2 diabetes mellitus with other skin ulcer N18.6 End stage renal disease Z99.2 Dependence on renal dialysis Facility Procedures CPT4 Code: [id number] Description: [id number] - WOUND CARE VISIT-LEV 5 EST PT Modifier: Quantity: 1 Physician Procedures CPT4 Code Description: [id number] - WC PHYS LEVEL 4 - EST PT ICD-10 Diagnosis Description I87.2 Venous insufficiency (chronic) (peripheral) I73.9 Peripheral vascular disease, unspecified L97.312 Non-pressure chronic ulcer of right ankle with fat layer  L97.812 Non-pressure chronic ulcer of other part of right lower Modifier: exposed leg with fat lay Quantity: 1 er exposed Electronic Signature(s) Signed: 10/14/2019 5:11:42 PM By: Worthy Keeler PA-C Entered By: Worthy Keeler on 10/14/2019 12:14:14

## 2019-10-15 DIAGNOSIS — E8770 Fluid overload, unspecified: Secondary | ICD-10-CM | POA: Diagnosis not present

## 2019-10-15 DIAGNOSIS — R197 Diarrhea, unspecified: Secondary | ICD-10-CM | POA: Diagnosis not present

## 2019-10-15 DIAGNOSIS — Z992 Dependence on renal dialysis: Secondary | ICD-10-CM | POA: Diagnosis not present

## 2019-10-15 DIAGNOSIS — R52 Pain, unspecified: Secondary | ICD-10-CM | POA: Diagnosis not present

## 2019-10-15 DIAGNOSIS — E8779 Other fluid overload: Secondary | ICD-10-CM | POA: Diagnosis not present

## 2019-10-15 DIAGNOSIS — N186 End stage renal disease: Secondary | ICD-10-CM | POA: Diagnosis not present

## 2019-10-15 DIAGNOSIS — D509 Iron deficiency anemia, unspecified: Secondary | ICD-10-CM | POA: Diagnosis not present

## 2019-10-15 DIAGNOSIS — N2581 Secondary hyperparathyroidism of renal origin: Secondary | ICD-10-CM | POA: Diagnosis not present

## 2019-10-15 DIAGNOSIS — I959 Hypotension, unspecified: Secondary | ICD-10-CM | POA: Diagnosis not present

## 2019-10-15 DIAGNOSIS — J449 Chronic obstructive pulmonary disease, unspecified: Secondary | ICD-10-CM | POA: Diagnosis not present

## 2019-10-15 DIAGNOSIS — Z87891 Personal history of nicotine dependence: Secondary | ICD-10-CM | POA: Diagnosis not present

## 2019-10-15 DIAGNOSIS — E0822 Diabetes mellitus due to underlying condition with diabetic chronic kidney disease: Secondary | ICD-10-CM | POA: Diagnosis not present

## 2019-10-15 DIAGNOSIS — D631 Anemia in chronic kidney disease: Secondary | ICD-10-CM | POA: Diagnosis not present

## 2019-10-17 DIAGNOSIS — E0822 Diabetes mellitus due to underlying condition with diabetic chronic kidney disease: Secondary | ICD-10-CM | POA: Diagnosis not present

## 2019-10-17 DIAGNOSIS — I959 Hypotension, unspecified: Secondary | ICD-10-CM | POA: Diagnosis not present

## 2019-10-17 DIAGNOSIS — N186 End stage renal disease: Secondary | ICD-10-CM | POA: Diagnosis not present

## 2019-10-17 DIAGNOSIS — Z992 Dependence on renal dialysis: Secondary | ICD-10-CM | POA: Diagnosis not present

## 2019-10-17 DIAGNOSIS — E8779 Other fluid overload: Secondary | ICD-10-CM | POA: Diagnosis not present

## 2019-10-17 DIAGNOSIS — J449 Chronic obstructive pulmonary disease, unspecified: Secondary | ICD-10-CM | POA: Diagnosis not present

## 2019-10-17 DIAGNOSIS — D631 Anemia in chronic kidney disease: Secondary | ICD-10-CM | POA: Diagnosis not present

## 2019-10-17 DIAGNOSIS — D509 Iron deficiency anemia, unspecified: Secondary | ICD-10-CM | POA: Diagnosis not present

## 2019-10-17 DIAGNOSIS — E8770 Fluid overload, unspecified: Secondary | ICD-10-CM | POA: Diagnosis not present

## 2019-10-17 DIAGNOSIS — Z87891 Personal history of nicotine dependence: Secondary | ICD-10-CM | POA: Diagnosis not present

## 2019-10-17 DIAGNOSIS — N2581 Secondary hyperparathyroidism of renal origin: Secondary | ICD-10-CM | POA: Diagnosis not present

## 2019-10-17 DIAGNOSIS — R52 Pain, unspecified: Secondary | ICD-10-CM | POA: Diagnosis not present

## 2019-10-17 DIAGNOSIS — R197 Diarrhea, unspecified: Secondary | ICD-10-CM | POA: Diagnosis not present

## 2019-10-19 DIAGNOSIS — I959 Hypotension, unspecified: Secondary | ICD-10-CM | POA: Diagnosis not present

## 2019-10-19 DIAGNOSIS — D509 Iron deficiency anemia, unspecified: Secondary | ICD-10-CM | POA: Diagnosis not present

## 2019-10-19 DIAGNOSIS — E0822 Diabetes mellitus due to underlying condition with diabetic chronic kidney disease: Secondary | ICD-10-CM | POA: Diagnosis not present

## 2019-10-19 DIAGNOSIS — D631 Anemia in chronic kidney disease: Secondary | ICD-10-CM | POA: Diagnosis not present

## 2019-10-19 DIAGNOSIS — N2581 Secondary hyperparathyroidism of renal origin: Secondary | ICD-10-CM | POA: Diagnosis not present

## 2019-10-19 DIAGNOSIS — Z992 Dependence on renal dialysis: Secondary | ICD-10-CM | POA: Diagnosis not present

## 2019-10-19 DIAGNOSIS — J449 Chronic obstructive pulmonary disease, unspecified: Secondary | ICD-10-CM | POA: Diagnosis not present

## 2019-10-19 DIAGNOSIS — N186 End stage renal disease: Secondary | ICD-10-CM | POA: Diagnosis not present

## 2019-10-19 DIAGNOSIS — E8770 Fluid overload, unspecified: Secondary | ICD-10-CM | POA: Diagnosis not present

## 2019-10-19 DIAGNOSIS — E8779 Other fluid overload: Secondary | ICD-10-CM | POA: Diagnosis not present

## 2019-10-19 DIAGNOSIS — R52 Pain, unspecified: Secondary | ICD-10-CM | POA: Diagnosis not present

## 2019-10-19 DIAGNOSIS — Z87891 Personal history of nicotine dependence: Secondary | ICD-10-CM | POA: Diagnosis not present

## 2019-10-19 DIAGNOSIS — E11622 Type 2 diabetes mellitus with other skin ulcer: Secondary | ICD-10-CM | POA: Diagnosis not present

## 2019-10-19 DIAGNOSIS — R197 Diarrhea, unspecified: Secondary | ICD-10-CM | POA: Diagnosis not present

## 2019-10-19 DIAGNOSIS — T8131XA Disruption of external operation (surgical) wound, not elsewhere classified, initial encounter: Secondary | ICD-10-CM | POA: Diagnosis not present

## 2019-10-19 NOTE — Progress Notes (Signed)
Jeffrey, [name] ([id number]) Visit Report for 10/14/2019 Arrival Information Details Patient Name: Date of Service: Jeffrey [name], Jeffrey [name] 10/14/2019 10:45 AM Medical Record [id number] Patient Account Number: [id number] Date of Birth/Sex: Treating RN: 1941-01-05 (78 y.o. Jeffrey [name] Primary Care Provider: [name] Other Clinician: Referring Provider: Treating Provider/Extender:[name], [name], [name]: 14 Visit Information History Since Last Visit Added or deleted any medications: No Patient Arrived: Wheel Chair Any new allergies or adverse reactions: No Arrival Time: 11:12 Had a fall or experienced change in No Accompanied By: son activities of daily living that may affect Transfer Assistance: Harrel Lemon Lift risk of falls: Patient Identification Verified: Yes Signs or symptoms of abuse/neglect since last No Secondary Verification Process Yes visito Completed: Hospitalized since last visit: No Patient Requires Transmission- No Implantable device outside of the clinic excluding No Based Precautions: cellular tissue based products placed in the center Patient Has Alerts: Yes since last visit: Patient Alerts: Patient on Blood Has Dressing in Place as Prescribed: Yes Thinner Pain Present Now: Yes R ABI = .44, R TBI= .43 Electronic Signature(s) Signed: 10/19/2019 2:18:43 PM By: [name] RN, BSN Entered By: [name] on 10/14/2019 11:13:13 -------------------------------------------------------------------------------- Clinic Level of Care Assessment Details Patient Name: Date of Service: Jeffrey [name], Jeffrey [name] 10/14/2019 10:45 AM Medical Record [id number] Patient Account Number: [id number] Date of Birth/Sex: Treating RN: 02-15-1941 (78 y.o. Jeffrey [name] Primary Care Provider: [name] Other Clinician: Referring Provider: Treating Provider/Extender:[name], [name], [name]:  14 Clinic Level of Care Assessment Items TOOL 4 Quantity Score [] - Use when only an EandM is performed on FOLLOW-UP visit 0 ASSESSMENTS - Nursing Assessment / Reassessment X - Reassessment of Co-morbidities (includes updates in patient status) 1 10 X - Reassessment of Adherence to Treatment Plan 1 5 ASSESSMENTS - Wound and Skin Assessment / Reassessment [] - Simple Wound Assessment / Reassessment - one wound 0 X - Complex Wound Assessment / Reassessment - multiple wounds 6 5 [] - Dermatologic / Skin Assessment (not related to wound area) 0 ASSESSMENTS - Focused Assessment X - Circumferential Edema Measurements - multi extremities 1 5 [] - Nutritional Assessment / Counseling / Intervention 0 X - Lower Extremity Assessment (monofilament, tuning fork, pulses) 1 5 [] - Peripheral Arterial Disease Assessment (using hand held doppler) 0 ASSESSMENTS - Ostomy and/or Continence Assessment and Care [] - Incontinence Assessment and Management 0 [] - Ostomy Care Assessment and Management (repouching, etc.) 0 PROCESS - Coordination of Care X - Simple Patient / Family Education for ongoing care 1 15 [] - Complex (extensive) Patient / Family Education for ongoing care 0 X - Staff obtains Programmer, systems, Records, Test Results / Process Orders 1 10 X - Staff telephones HHA, Nursing Homes / Clarify orders / etc 1 10 [] - Routine Transfer to another Facility (non-emergent condition) 0 [] - Routine Hospital Admission (non-emergent condition) 0 [] - New Admissions / Biomedical engineer / Ordering NPWT, Apligraf, etc. 0 [] - Emergency Hospital Admission (emergent condition) 0 X - Simple Discharge Coordination 1 10 [] - Complex (extensive) Discharge Coordination 0 PROCESS - Special Needs [] - Pediatric / Minor Patient Management 0 [] - Isolation Patient Management 0 [] - Hearing / Language / Visual special needs 0 [] - Assessment of Community assistance (transportation, D/C planning, etc.) 0 [] -  Additional assistance / Altered mentation 0 [] - Support Surface(s) Assessment (bed, cushion, seat, etc.) 0 INTERVENTIONS - Wound Cleansing / Measurement [] - Simple  Wound Cleansing - one wound 0 X - Complex Wound Cleansing - multiple wounds 6 5 X - Wound Imaging (photographs - any number of wounds) 1 5 [] - Wound Tracing (instead of photographs) 0 [] - Simple Wound Measurement - one wound 0 X - Complex Wound Measurement - multiple wounds 6 5 INTERVENTIONS - Wound Dressings X - Small Wound Dressing one or multiple wounds 4 10 X - Medium Wound Dressing one or multiple wounds 1 15 [] - Large Wound Dressing one or multiple wounds 0 X - Application of Medications - topical 1 5 [] - Application of Medications - injection 0 INTERVENTIONS - Miscellaneous [] - External ear exam 0 [] - Specimen Collection (cultures, biopsies, blood, body fluids, etc.) 0 [] - Specimen(s) / Culture(s) sent or taken to Lab for analysis 0 [] - Patient Transfer (multiple staff / Civil Service fast streamer / Similar devices) 0 [] - Simple Staple / Suture removal (25 or less) 0 [] - Complex Staple / Suture removal (26 or more) 0 [] - Hypo / Hyperglycemic Management (close monitor of Blood Glucose) 0 [] - Ankle / Brachial Index (ABI) - do not check if billed separately 0 X - Vital Signs 1 5 Has the patient been seen at the hospital within the last three years: Yes Total Score: 230 Level Of Care: New/Established - Level 5 Electronic Signature(s) Signed: 10/14/2019 5:42:21 PM By: Baruch Gouty RN, BSN Entered By: Baruch Gouty on 10/14/2019 12:13:13 -------------------------------------------------------------------------------- Complex / Palliative Patient Assessment Details Patient Name: Date of Service: Jeffrey [name], Jeffrey [name] 10/14/2019 10:45 AM Medical Record [id number] Patient Account Number: [id number] Date of Birth/Sex: 1941/08/11 (78 y.o. M) Treating RN: [name] Primary Care [name]: [name] Other  Clinician: Referring [name]: Treating [name]/Extender:[name], [name], [name]: 14 Palliative Management Criteria Complex Wound Management Criteria Patient has remarkable or complex co-morbidities requiring medications or treatments that extend wound healing times. Examples: Diabetes mellitus with chronic renal failure or end stage renal disease requiring dialysis Advanced or poorly controlled rheumatoid arthritis Diabetes mellitus and end stage chronic obstructive pulmonary disease Active cancer with current chemo- or radiation therapy Type 2 Diabetes, ESRD on Dialysis, A-Fib, CHF Care Approach Wound Care Plan: Complex Wound Management Electronic Signature(s) Signed: 10/18/2019 11:33:26 PM By: Worthy Keeler PA-C Signed: 10/19/2019 2:18:43 PM By: [name] RN, BSN Entered By: [name] on 10/16/2019 11:30:45 -------------------------------------------------------------------------------- Encounter Discharge Information Details Patient Name: Date of Service: Jeffrey [name], Jeffrey [name] 10/14/2019 10:45 AM Medical Record [id number] Patient Account Number: [id number] Date of Birth/Sex: Treating RN: June 23, 1941 (78 y.o. [name] Primary Care Symphoni Helbling: [name] Other Clinician: Referring Agnieszka Newhouse: Treating Hamzeh Tall/Extender:[name], [name], [name]: 14 Encounter Discharge Information Items Discharge Condition: Stable Ambulatory Status: Wheelchair Discharge Destination: Home Transportation: Private Auto Accompanied By: son Schedule Follow-up Appointment: Yes Clinical Summary of Care: Electronic Signature(s) Signed: 10/14/2019 5:18:27 PM By: Deon Pilling Entered By: Deon Pilling on 10/14/2019 13:09:09 -------------------------------------------------------------------------------- Lower Extremity Assessment Details Patient Name: Date of Service: Jeffrey [name], Jeffrey [name] 10/14/2019 10:45 AM Medical  Record [id number] Patient Account Number: [id number] Date of Birth/Sex: Treating RN: 12/29/1940 (78 y.o. Jeffrey [name] Primary Care Malene Blaydes: [name] Other Clinician: Referring Deya Bigos: Treating Skylie Hiott/Extender:[name], [name], [name]: 14 Edema Assessment Assessed: [Left: No] [Right: No] Edema: [Left: N] [Right: o] Calf Left: Right: Point of Measurement: 27 cm From Medial Instep cm 40 cm Ankle Left: Right: Point of Measurement: 10 cm From Medial Instep cm 24 cm Vascular  Assessment Pulses: Dorsalis Pedis Palpable: [Right:No] Electronic Signature(s) Signed: 10/19/2019 2:18:43 PM By: [name] RN, BSN Entered By: [name] on 10/14/2019 11:38:20 -------------------------------------------------------------------------------- [place] Details Patient Name: Date of Service: Jeffrey [name], Jeffrey [name] 10/14/2019 10:45 AM Medical Record [id number] Patient Account Number: [id number] Date of Birth/Sex: Treating RN: 1941/05/25 (78 y.o. Jeffrey [name] Primary Care Nevia Henkin: [name] Other Clinician: Referring Jayanth Szczesniak: Treating Elverta Dimiceli/Extender:[name], [name], [name]: 14 Active Inactive Pressure Nursing Diagnoses: Knowledge deficit related to causes and risk factors for pressure ulcer development Goals: Patient will remain free of pressure ulcers Date Initiated: 07/08/2019 Target Resolution Date: 11/04/2019 Goal Status: Active Interventions: Assess: immobility, friction, shearing, incontinence upon admission and as needed Treatment Activities: Pressure reduction/relief device ordered : 07/08/2019 Notes: Wound/Skin Impairment Nursing Diagnoses: Impaired tissue integrity Knowledge deficit related to ulceration/compromised skin integrity Goals: Patient/caregiver will verbalize understanding of skin care regimen Date Initiated: 07/08/2019 Target Resolution Date:  10/28/2019 Goal Status: Active Ulcer/skin breakdown will have a volume reduction of 30% by week 4 Date Initiated: 07/08/2019 Date Inactivated: 08/05/2019 Target Resolution Date: 08/05/2019 Goal Status: Met Ulcer/skin breakdown will have a volume reduction of 50% by week 8 Date Initiated: 08/05/2019 Date Inactivated: 09/02/2019 Target Resolution Date: 09/04/2019 Unmet Reason: multiple Goal Status: Unmet comorbidities Ulcer/skin breakdown will have a volume reduction of 80% by week 12 Date Initiated: 09/02/2019 Date Inactivated: 09/30/2019 Target Resolution Date: 09/30/2019 Unmet Reason: multiple Goal Status: Unmet comorbidities Interventions: Assess patient/caregiver ability to obtain necessary supplies Assess patient/caregiver ability to perform ulcer/skin care regimen upon admission and as needed Assess ulceration(s) every visit Treatment Activities: Skin care regimen initiated : 07/08/2019 Topical wound management initiated : 07/08/2019 Notes: Electronic Signature(s) Signed: 10/14/2019 5:42:21 PM By: Baruch Gouty RN, BSN Entered By: Baruch Gouty on 10/14/2019 12:01:06 -------------------------------------------------------------------------------- Pain Assessment Details Patient Name: Date of Service: Jeffrey [name], Jeffrey [name] 10/14/2019 10:45 AM Medical Record [id number] Patient Account Number: [id number] Date of Birth/Sex: Treating RN: [birth year] (78 y.o. Jeffrey [name] Primary Care Oswald Pott: [name] Other Clinician: Referring Jeaneane Adamec: Treating Georgann Bramble/Extender:[name], [name], [name]: 14 Active Problems Location of Pain Severity and Description of Pain Patient Has Paino Yes Site Locations With Dressing Change: Yes Rate the pain. Current Pain Level: 2 Character of Pain Describe the Pain: Dull Pain Management and Medication Current Pain Management: Medication: Yes Cold Application: No Rest: No Massage: No Activity:  No T.E.N.S.: No Heat Application: No Leg drop or elevation: No Is the Current Pain Management Adequate: Adequate How does your wound impact your activities of daily livingo Sleep: No Bathing: No Appetite: No Relationship With Others: No Bladder Continence: No Emotions: No Bowel Continence: No Work: No Toileting: No Drive: No Dressing: No Hobbies: No Electronic Signature(s) Signed: 10/19/2019 2:18:43 PM By: [name] RN, BSN Entered By: [name] on 10/14/2019 11:21:37 -------------------------------------------------------------------------------- Patient/Caregiver Education Details Patient Name: [name] 11/18/2020andnbsp10:45 Date of Service: AM Medical Record [id number] Number: Patient Account Number: [id number] Treating RN: Date of Birth/Gender: [birth year] (78 y.o. Jeffrey [name]) Other Clinician: Primary Care Treating HARRIS, Orson Slick Physician: Physician/Extender: Referring Physician: Clearence Cheek in Treatment: 14 Education Assessment Education Provided To: Patient Education Topics Provided Pressure: Methods: Explain/Verbal Responses: State content correctly Venous: Methods: Explain/Verbal Responses: Reinforcements needed, State content correctly Wound/Skin Impairment: Methods: Explain/Verbal Responses: Reinforcements needed, State content correctly Electronic Signature(s) Signed: 10/14/2019 5:42:21 PM By: Baruch Gouty RN, BSN Entered By: Baruch Gouty on 10/14/2019 12:01:46 -------------------------------------------------------------------------------- Wound Assessment Details Patient Name: Date of Service:  MAREK, Jeffrey [name] 10/14/2019 10:45 AM Medical Record [id number] Patient Account Number: [id number] Date of Birth/Sex: Treating RN: 1941-08-25 (78 y.o. Jeffrey [name] Primary Care Tayla Panozzo: [name] Other Clinician: Referring Brande Uncapher: Treating Jorell Agne/Extender:[name],  [name], [name]: 14 Wound Status Wound Number: 1 Primary Venous Leg Ulcer Etiology: Wound Location: Right Malleolus - Lateral Wound Open Wounding Event: Gradually Appeared Status: Status: Date Acquired: 06/26/2019 Comorbid Cataracts, Chronic Obstructive Pulmonary Weeks Of Treatment: 14 History: Disease (COPD), Congestive Heart Failure, Clustered Wound: No Hypertension, Myocardial Infarction, Peripheral Venous Disease, Type II Diabetes, End Stage Renal Disease, Osteomyelitis Wound Measurements Length: (cm) 0.8 % Reduction Width: (cm) 0.8 % Reduction Depth: (cm) 0.4 Epitheliali Area: (cm) 0.503 Tunneling: Volume: (cm) 0.201 Underminin Wound Description Classification: Full Thickness With Exposed Support Foul Odor Structures Slough/Fib Wound Distinct, outline attached Margin: Exudate Medium Amount: Exudate Type: Serosanguineous Exudate red, brown Color: Wound Bed Granulation Amount: Small (1-33%) Granulation Quality: Pink Fascia Expo Necrotic Amount: Large (67-100%) Fat Layer ( Necrotic Quality: Adherent Slough Tendon Expo Muscle Expo Joint Expos Bone Expose After Cleansing: No rino Yes Exposed Structure sed: No Subcutaneous Tissue) Exposed: Yes sed: No sed: No ed: No d: Yes in Area: -42.5% in Volume: -474.3% zation: None No g: No Treatment Notes Wound #1 (Right, Lateral Malleolus) 1. Cleanse With Wound Cleanser Soap and water 2. Periwound Care Moisturizing lotion 3. Primary Dressing Applied Calcium Alginate Ag 4. Secondary Dressing Dry Gauze 6. Support Layer Applied Kerlix/Coban Notes netting. Electronic Signature(s) Signed: 10/19/2019 2:18:43 PM By: [name] RN, BSN Entered By: [name] on 10/14/2019 11:36:41 -------------------------------------------------------------------------------- Wound Assessment Details Patient Name: Date of Service: JEDEDIAH, NODA 10/14/2019 10:45 AM Medical Record  [id number] Patient Account Number: [id number] Date of Birth/Sex: Treating RN: 05-03-1941 (78 y.o. Jeffrey [name] Primary Care Simmie Camerer: [name] Other Clinician: Referring Stephfon Bovey: Treating Iman Reinertsen/Extender:[name], [name], [name]: 14 Wound Status Wound Number: 10 Primary Diabetic Wound/Ulcer of the Lower Extremity Etiology: Wound Location: Right Toe Third - Plantar Wound Open Wounding Event: Gradually Appeared Status: Date Acquired: 09/16/2019 Comorbid Cataracts, Chronic Obstructive Pulmonary Weeks Of Treatment: 4 History: Disease (COPD), Congestive Heart Failure, Clustered Wound: No Hypertension, Myocardial Infarction, Peripheral Venous Disease, Type II Diabetes, End Stage Renal Disease, Osteomyelitis Wound Measurements Length: (cm) 1.5 % Reduction in A Width: (cm) 1.5 % Reduction in V Depth: (cm) 0.1 Epithelializatio Area: (cm) 1.767 Tunneling: Volume: (cm) 0.177 Undermining: Wound Description Classification: Grade 1 Foul Odor After Wound Margin: Flat and Intact Slough/Fibrino Exudate Amount: Medium Exudate Type: Serosanguineous Exudate Color: red, brown Wound Bed Granulation Amount: None Present (0%) Necrotic Amount: Large (67-100%) Fascia Exposed: Necrotic Quality: Adherent Slough Fat Layer (Subcu Tendon Exposed: Muscle Exposed: Joint Exposed: Bone Exposed: Cleansing: No No Exposed Structure No taneous Tissue) Exposed: Yes No No No No rea: -1025.5% olume: -471% n: None No No Treatment Notes Wound #10 (Right, Plantar Toe Third) 1. Cleanse With Wound Cleanser 3. Primary Dressing Applied Calcium Alginate Ag 4. Secondary Dressing Dry Gauze Roll Gauze 5. Secured With [facility] [facility] [employer]) Signed: 10/19/2019 2:18:43 PM By: [name] RN, BSN Entered By: [name] on 10/14/2019 11:36:58 -------------------------------------------------------------------------------- Wound  Assessment Details Patient Name: Date of Service: CHILD, Jeffrey [name] 10/14/2019 10:45 AM Medical Record [id number] Patient Account Number: [id number] Date of Birth/Sex: Treating RN: Mar 24, 1941 (78 y.o. Jeffrey [name] Primary Care Laquia Rosano: [name] Other Clinician: Referring Davon Abdelaziz: Treating Kenroy Timberman/Extender:[name], [name], [name]: 14 Wound Status Wound Number: 11 Primary Diabetic  Wound/Ulcer of the Lower Extremity Etiology: Wound Location: Right Calcaneus Wound Open Wounding Event: Pressure Injury Status: Date Acquired: 10/14/2019 Comorbid Cataracts, Chronic Obstructive Pulmonary Weeks Of Treatment: 0 History: Disease (COPD), Congestive Heart Failure, Clustered Wound: No Hypertension, Myocardial Infarction, Peripheral Venous Disease, Type II Diabetes, End Stage Renal Disease, Osteomyelitis Wound Measurements Length: (cm) 0.7 Width: (cm) 1.4 Depth: (cm) 0.2 Area: (cm) 0.77 Volume: (cm) 0.154 Wound Description Classification: Unable to visualize wound bed Wound Margin: Flat and Intact Exudate Amount: Medium Exudate Type: Serosanguineous Exudate Color: red, brown Wound Bed Granulation Amount: None Present (0%) Necrotic Amount: Large (67-100%) Necrotic Quality: Adherent Slough fter Cleansing: No ino Yes Exposed Structure ed: No ubcutaneous Tissue) Exposed: No ed: No ed: No d: No : No % Reduction in Area: % Reduction in Volume: Epithelialization: None Tunneling: No Undermining: No Foul Odor A Slough/Fibr Fascia Expos Fat Layer (S Tendon Expos Muscle Expos Joint Expose Bone Exposed Treatment Notes Wound #11 (Right Calcaneus) 1. Cleanse With Wound Cleanser Soap and water 2. Periwound Care Moisturizing lotion 3. Primary Dressing Applied Calcium Alginate Ag 4. Secondary Dressing Dry Gauze 6. Support Layer Applied Kerlix/Coban Notes netting. Electronic Signature(s) Signed: 10/19/2019 2:18:43 PM  By: [name] RN, BSN Entered By: [name] on 10/14/2019 11:41:52 -------------------------------------------------------------------------------- Wound Assessment Details Patient Name: Date of Service: Jeffrey [name], Jeffrey [name] 10/14/2019 10:45 AM Medical Record [name] Patient Account Number: [id number] Date of Birth/Sex: Treating RN: 1941-06-19 (78 y.o. Jeffrey [name] Primary Care Helton Oleson: [name] Other Clinician: Referring Keigen Caddell: Treating Cylee Dattilo/Extender:[name], [name], [name]: 14 Wound Status Wound Number: 4 Primary Venous Leg Ulcer Etiology: Wound Location: Right Lower Leg - Medial Wound Open Wounding Event: Blister Status: Date Acquired: 07/10/2019 Comorbid Cataracts, Chronic Obstructive Pulmonary Weeks Of Treatment: 13 History: Disease (COPD), Congestive Heart Failure, Clustered Wound: No Hypertension, Myocardial Infarction, Peripheral Venous Disease, Type II Diabetes, End Stage Renal Disease, Osteomyelitis Wound Measurements Length: (cm) 2.4 % Reduc Width: (cm) 2.8 % Reduc Depth: (cm) 0.1 Epithel Area: (cm) 5.278 Tunnel Volume: (cm) 0.528 Underm Wound Description Classification: Full Thickness Without Exposed Support Foul O Structures Slough Wound Distinct, outline attached Margin: Exudate Medium Amount: Exudate Serosanguineous Type: Exudate Exudate red, brown Color: Wound Bed Granulation Amount: Medium (34-66%) Granulation Quality: Pink Fascia Expos Necrotic Amount: Medium (34-66%) Fat Layer (S Necrotic Quality: Adherent Slough Tendon Expos Muscle Expos Joint Expose Bone Exposed dor After Cleansing: No /Fibrino Yes Exposed Structure ed: No ubcutaneous Tissue) Exposed: Yes ed: No ed: No d: No : No tion in Area: 17% tion in Volume: 17% ialization: Small (1-33%) ing: No ining: No Treatment Notes Wound #4 (Right, Medial Lower Leg) 1. Cleanse With Wound Cleanser Soap and water 2.  Periwound Care Moisturizing lotion 3. Primary Dressing Applied Calcium Alginate Ag 4. Secondary Dressing Dry Gauze 6. Support Layer Applied Kerlix/Coban Notes netting. Electronic Signature(s) Signed: 10/19/2019 2:18:43 PM By: [name] RN, BSN Entered By: [name] on 10/14/2019 11:37:14 -------------------------------------------------------------------------------- Wound Assessment Details Patient Name: Date of Service: Jeffrey [name], Jeffrey [name] 10/14/2019 10:45 AM Medical Record [id number] Patient Account Number: [id number] Date of Birth/Sex: Treating RN: 08-Jun-1941 (78 y.o. Jeffrey [name] Primary Care Ethelmae Ringel: [name] Other Clinician: Referring Apryll Hinkle: Treating Keion Neels/Extender:[name], [name], [name]: 14 Wound Status Wound Number: 7 Primary Diabetic Wound/Ulcer of the Lower Extremity Etiology: Wound Location: Right Toe Second Wound Open Wounding Event: Gradually Appeared Status: Date Acquired: 08/14/2019 Comorbid Cataracts, Chronic Obstructive Pulmonary Weeks Of Treatment: 7 History: Disease (COPD), Congestive Heart Failure, Clustered  Wound: No Hypertension, Myocardial Infarction, Peripheral Venous Disease, Type II Diabetes, End Stage Renal Disease, Osteomyelitis Wound Measurements Length: (cm) 1.5 % Reductio Width: (cm) 1.8 % Reductio Depth: (cm) 0.1 Epithelial Area: (cm) 2.121 Tunneling Volume: (cm) 0.212 Undermini Wound Description Classification: Grade 2 Wound Margin: Distinct, outline attached Exudate Amount: Small Exudate Type: Serosanguineous Exudate Color: red, brown Wound Bed Granulation Amount: None Present (0%) Necrotic Amount: Large (67-100%) Necrotic Quality: Eschar, Adherent Slough Foul Odor After Cleansing: No Slough/Fibrino Yes Exposed Structure Fascia Exposed: No Fat Layer (Subcutaneous Tissue) Exposed: Yes Tendon Exposed: No Muscle Exposed: No Joint Exposed: No Bone Exposed:  No n in Area: -200% n in Volume: -50.4% ization: None : No ng: No Treatment Notes Wound #7 (Right Toe Second) 1. Cleanse With Wound Cleanser 3. Primary Dressing Applied Calcium Alginate Ag 4. Secondary Dressing Dry Gauze Roll Gauze 5. Secured With [facility] [facility] [employer]) Signed: 10/19/2019 2:18:43 PM By: [name] RN, BSN Entered By: [name] on 10/14/2019 11:37:29 -------------------------------------------------------------------------------- Wound Assessment Details Patient Name: Date of Service: Jeffrey [name], Jeffrey [name] 10/14/2019 10:45 AM Medical Record [id number] Patient Account Number: [id number] Date of Birth/Sex: Treating RN: 1941/10/20 (78 y.o. Jeffrey [name] Primary Care Provider: [name] Other Clinician: Referring Provider: Treating Provider/Extender:[name], [name], [name]: 14 Wound Status Wound Number: 8 Primary Pressure Ulcer Etiology: Wound Location: Left Ischium Wound Healed - Epithelialized Wounding Event: Gradually Appeared Status: Date Acquired: 09/16/2019 Comorbid Cataracts, Chronic Obstructive Pulmonary Weeks Of Treatment: 4 History: Disease (COPD), Congestive Heart Failure, Clustered Wound: No Hypertension, Myocardial Infarction, Peripheral Venous Disease, Type II Diabetes, End Stage Renal Disease, Osteomyelitis Wound Measurements Length: (cm) 0 % Reduction Width: (cm) 0 % Reduction Depth: (cm) 0 Epithelializ Area: (cm) 0 Tunneling: Volume: (cm) 0 Undermining Wound Description Classification: Category/Stage II Foul Odor A Wound Margin: Flat and Intact Slough/Fibr Exudate Amount: None Present Wound Bed Granulation Amount: None Present (0%) Necrotic Amount: None Present (0%) Fascia Expos Fat Layer (S Tendon Expos Muscle Expos Joint Expose Bone Exposed fter Cleansing: No ino No Exposed Structure ed: No ubcutaneous Tissue) Exposed: No ed: No ed: No d: No :  No in Area: 100% in Volume: 100% ation: Large (67-100%) No : No Electronic Signature(s) Signed: 10/19/2019 2:18:43 PM By: [name] RN, BSN Entered By: [name] on 10/14/2019 11:37:45 -------------------------------------------------------------------------------- Wound Assessment Details Patient Name: Date of Service: [name] 10/14/2019 10:45 AM Medical Record [id number] Patient Account Number: [id number] Date of Birth/Sex: Treating RN: [birth year] (78 y.o. 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 History: Disease (COPD), Congestive Heart Failure, Clustered Wound: No Hypertension, Myocardial Infarction, Peripheral Venous Disease, Type II Diabetes, End Stage Renal Disease, Osteomyelitis Wound Measurements Length: (cm) 1.5 % Reduct Width: (cm) 0.4 % Reduct Depth: (cm) 0.1 Epitheli Area: (cm) 0.471 Tunneli Volume: (cm) 0.047 Undermi Wound Description Classification: Category/Stage II Wound Margin: Flat and Intact Exudate Amount: Medium Exudate Type: Serosanguineous Exudate Color: red, brown Wound Bed Granulation Amount: Large (67-100%) Granulation Quality: Red Necrotic Amount: None Present (0%) Foul Odor After Cleansing: No Slough/Fibrino No Exposed Structure Fascia Exposed: No Fat Layer (Subcutaneous Tissue) Exposed: Yes Tendon Exposed: No Muscle Exposed: No Joint Exposed: No Bone Exposed: No ion in Area: 86.7% ion in Volume: 95.6%  alization: Small (1-33%) ng: No ning: No Treatment Notes Wound #9 (Perineum) Notes primary dressing zinc oxide. Electronic  Signature(s) Signed: 10/19/2019 2:18:43 PM By: [name] RN, BSN Entered By: [name] on 10/14/2019 11:38:03 -------------------------------------------------------------------------------- [place] Details Patient Name: Date of Service: DEANGLO, HISSONG 10/14/2019 10:45 AM Medical Record [id number] Patient Account Number: [id number] Date of Birth/Sex: Treating RN: [birth year] (78 y.o. Jeffrey [name] Primary Care Provider: [name] Other Clinician: Referring Provider: Treating Provider/Extender:[name], [name], [name]: 14 Vital Signs Time Taken: 11:14 Temperature (F): 98.0 Height (in): 71 Pulse (bpm): 54 Weight (lbs): 209 Respiratory Rate (breaths/min): 18 Body Mass Index (BMI): 29.1 Blood Pressure (mmHg): 92/45 Capillary Blood Glucose (mg/dl): 147 Reference Range: 80 - 120 mg / dl Notes glucose per pt report Electronic Signature(s) Signed: 10/19/2019 2:18:43 PM By: [name] RN, BSN Entered By: [name] on 10/14/2019 11:21:15

## 2019-10-20 DIAGNOSIS — Z87891 Personal history of nicotine dependence: Secondary | ICD-10-CM | POA: Diagnosis not present

## 2019-10-20 DIAGNOSIS — E8779 Other fluid overload: Secondary | ICD-10-CM | POA: Diagnosis not present

## 2019-10-20 DIAGNOSIS — N2581 Secondary hyperparathyroidism of renal origin: Secondary | ICD-10-CM | POA: Diagnosis not present

## 2019-10-20 DIAGNOSIS — J449 Chronic obstructive pulmonary disease, unspecified: Secondary | ICD-10-CM | POA: Diagnosis not present

## 2019-10-20 DIAGNOSIS — E0822 Diabetes mellitus due to underlying condition with diabetic chronic kidney disease: Secondary | ICD-10-CM | POA: Diagnosis not present

## 2019-10-20 DIAGNOSIS — Z992 Dependence on renal dialysis: Secondary | ICD-10-CM | POA: Diagnosis not present

## 2019-10-20 DIAGNOSIS — E8770 Fluid overload, unspecified: Secondary | ICD-10-CM | POA: Diagnosis not present

## 2019-10-20 DIAGNOSIS — I959 Hypotension, unspecified: Secondary | ICD-10-CM | POA: Diagnosis not present

## 2019-10-20 DIAGNOSIS — D509 Iron deficiency anemia, unspecified: Secondary | ICD-10-CM | POA: Diagnosis not present

## 2019-10-20 DIAGNOSIS — N186 End stage renal disease: Secondary | ICD-10-CM | POA: Diagnosis not present

## 2019-10-20 DIAGNOSIS — D631 Anemia in chronic kidney disease: Secondary | ICD-10-CM | POA: Diagnosis not present

## 2019-10-20 DIAGNOSIS — R197 Diarrhea, unspecified: Secondary | ICD-10-CM | POA: Diagnosis not present

## 2019-10-20 DIAGNOSIS — R52 Pain, unspecified: Secondary | ICD-10-CM | POA: Diagnosis not present

## 2019-10-21 ENCOUNTER — Emergency Department (HOSPITAL_COMMUNITY): Payer: Medicare Other

## 2019-10-21 ENCOUNTER — Encounter (HOSPITAL_BASED_OUTPATIENT_CLINIC_OR_DEPARTMENT_OTHER): Payer: Medicare Other

## 2019-10-21 ENCOUNTER — Other Ambulatory Visit: Payer: Self-pay

## 2019-10-21 ENCOUNTER — Encounter (HOSPITAL_COMMUNITY): Payer: Self-pay

## 2019-10-21 ENCOUNTER — Emergency Department (HOSPITAL_COMMUNITY)
Admission: EM | Admit: 2019-10-21 | Discharge: 2019-10-21 | Discharge status: Against Medical Advice | Payer: Medicare Other | Source: Home / Self Care | Attending: Emergency Medicine | Admitting: Emergency Medicine

## 2019-10-21 DIAGNOSIS — E785 Hyperlipidemia, unspecified: Secondary | ICD-10-CM | POA: Diagnosis not present

## 2019-10-21 DIAGNOSIS — M869 Osteomyelitis, unspecified: Secondary | ICD-10-CM | POA: Diagnosis not present

## 2019-10-21 DIAGNOSIS — J690 Pneumonitis due to inhalation of food and vomit: Secondary | ICD-10-CM | POA: Diagnosis not present

## 2019-10-21 DIAGNOSIS — L97311 Non-pressure chronic ulcer of right ankle limited to breakdown of skin: Secondary | ICD-10-CM | POA: Diagnosis not present

## 2019-10-21 DIAGNOSIS — E1122 Type 2 diabetes mellitus with diabetic chronic kidney disease: Secondary | ICD-10-CM | POA: Insufficient documentation

## 2019-10-21 DIAGNOSIS — R001 Bradycardia, unspecified: Secondary | ICD-10-CM | POA: Diagnosis not present

## 2019-10-21 DIAGNOSIS — L97511 Non-pressure chronic ulcer of other part of right foot limited to breakdown of skin: Secondary | ICD-10-CM | POA: Diagnosis not present

## 2019-10-21 DIAGNOSIS — J90 Pleural effusion, not elsewhere classified: Secondary | ICD-10-CM | POA: Diagnosis not present

## 2019-10-21 DIAGNOSIS — L97516 Non-pressure chronic ulcer of other part of right foot with bone involvement without evidence of necrosis: Secondary | ICD-10-CM | POA: Diagnosis not present

## 2019-10-21 DIAGNOSIS — E11649 Type 2 diabetes mellitus with hypoglycemia without coma: Secondary | ICD-10-CM | POA: Diagnosis not present

## 2019-10-21 DIAGNOSIS — Z7901 Long term (current) use of anticoagulants: Secondary | ICD-10-CM | POA: Insufficient documentation

## 2019-10-21 DIAGNOSIS — N186 End stage renal disease: Secondary | ICD-10-CM | POA: Insufficient documentation

## 2019-10-21 DIAGNOSIS — I9589 Other hypotension: Secondary | ICD-10-CM | POA: Diagnosis not present

## 2019-10-21 DIAGNOSIS — L03115 Cellulitis of right lower limb: Secondary | ICD-10-CM

## 2019-10-21 DIAGNOSIS — Z87891 Personal history of nicotine dependence: Secondary | ICD-10-CM | POA: Insufficient documentation

## 2019-10-21 DIAGNOSIS — I10 Essential (primary) hypertension: Secondary | ICD-10-CM | POA: Diagnosis not present

## 2019-10-21 DIAGNOSIS — J9621 Acute and chronic respiratory failure with hypoxia: Secondary | ICD-10-CM | POA: Diagnosis not present

## 2019-10-21 DIAGNOSIS — Z79899 Other long term (current) drug therapy: Secondary | ICD-10-CM | POA: Insufficient documentation

## 2019-10-21 DIAGNOSIS — I4821 Permanent atrial fibrillation: Secondary | ICD-10-CM | POA: Diagnosis not present

## 2019-10-21 DIAGNOSIS — N2581 Secondary hyperparathyroidism of renal origin: Secondary | ICD-10-CM | POA: Diagnosis not present

## 2019-10-21 DIAGNOSIS — Z03818 Encounter for observation for suspected exposure to other biological agents ruled out: Secondary | ICD-10-CM | POA: Diagnosis not present

## 2019-10-21 DIAGNOSIS — Z794 Long term (current) use of insulin: Secondary | ICD-10-CM | POA: Insufficient documentation

## 2019-10-21 DIAGNOSIS — I2510 Atherosclerotic heart disease of native coronary artery without angina pectoris: Secondary | ICD-10-CM | POA: Insufficient documentation

## 2019-10-21 DIAGNOSIS — L089 Local infection of the skin and subcutaneous tissue, unspecified: Secondary | ICD-10-CM

## 2019-10-21 DIAGNOSIS — Z5320 Procedure and treatment not carried out because of patient's decision for unspecified reasons: Secondary | ICD-10-CM | POA: Insufficient documentation

## 2019-10-21 DIAGNOSIS — Z992 Dependence on renal dialysis: Secondary | ICD-10-CM | POA: Insufficient documentation

## 2019-10-21 DIAGNOSIS — I132 Hypertensive heart and chronic kidney disease with heart failure and with stage 5 chronic kidney disease, or end stage renal disease: Secondary | ICD-10-CM | POA: Insufficient documentation

## 2019-10-21 DIAGNOSIS — E1152 Type 2 diabetes mellitus with diabetic peripheral angiopathy with gangrene: Secondary | ICD-10-CM | POA: Diagnosis not present

## 2019-10-21 DIAGNOSIS — I5042 Chronic combined systolic (congestive) and diastolic (congestive) heart failure: Secondary | ICD-10-CM | POA: Diagnosis not present

## 2019-10-21 DIAGNOSIS — R918 Other nonspecific abnormal finding of lung field: Secondary | ICD-10-CM | POA: Diagnosis not present

## 2019-10-21 DIAGNOSIS — I4510 Unspecified right bundle-branch block: Secondary | ICD-10-CM | POA: Diagnosis not present

## 2019-10-21 DIAGNOSIS — I442 Atrioventricular block, complete: Secondary | ICD-10-CM | POA: Diagnosis not present

## 2019-10-21 DIAGNOSIS — E1169 Type 2 diabetes mellitus with other specified complication: Secondary | ICD-10-CM | POA: Diagnosis not present

## 2019-10-21 DIAGNOSIS — I70261 Atherosclerosis of native arteries of extremities with gangrene, right leg: Secondary | ICD-10-CM | POA: Diagnosis not present

## 2019-10-21 DIAGNOSIS — I5022 Chronic systolic (congestive) heart failure: Secondary | ICD-10-CM | POA: Insufficient documentation

## 2019-10-21 LAB — COMPREHENSIVE METABOLIC PANEL
ALT: 15 U/L (ref 0–44)
AST: 35 U/L (ref 15–41)
Albumin: 2.4 g/dL — ABNORMAL LOW (ref 3.5–5.0)
Alkaline Phosphatase: 146 U/L — ABNORMAL HIGH (ref 38–126)
Anion gap: 14 (ref 5–15)
BUN: 25 mg/dL — ABNORMAL HIGH (ref 8–23)
CO2: 27 mmol/L (ref 22–32)
Calcium: 8 mg/dL — ABNORMAL LOW (ref 8.9–10.3)
Chloride: 92 mmol/L — ABNORMAL LOW (ref 98–111)
Creatinine, Ser: 4.19 mg/dL — ABNORMAL HIGH (ref 0.61–1.24)
GFR calc Af Amer: 15 mL/min — ABNORMAL LOW (ref >60)
GFR calc non Af Amer: 13 mL/min — ABNORMAL LOW (ref >60)
Glucose, Bld: 127 mg/dL — ABNORMAL HIGH (ref 70–99)
Potassium: 4.3 mmol/L (ref 3.5–5.1)
Sodium: 133 mmol/L — ABNORMAL LOW (ref 135–145)
Total Bilirubin: 2.9 mg/dL — ABNORMAL HIGH (ref 0.3–1.2)
Total Protein: 6.5 g/dL (ref 6.5–8.1)

## 2019-10-21 LAB — CBC WITH DIFFERENTIAL/PLATELET
Abs Immature Granulocytes: 0.06 10*3/uL (ref 0.00–0.07)
Basophils Absolute: 0.1 10*3/uL (ref 0.0–0.1)
Basophils Relative: 1 %
Eosinophils Absolute: 0.1 10*3/uL (ref 0.0–0.5)
Eosinophils Relative: 1 %
HCT: 36.5 % — ABNORMAL LOW (ref 39.0–52.0)
Hemoglobin: 11.7 g/dL — ABNORMAL LOW (ref 13.0–17.0)
Immature Granulocytes: 1 %
Lymphocytes Relative: 10 %
Lymphs Abs: 1.1 10*3/uL (ref 0.7–4.0)
MCH: 30.4 pg (ref 26.0–34.0)
MCHC: 32.1 g/dL (ref 30.0–36.0)
MCV: 94.8 fL (ref 80.0–100.0)
Monocytes Absolute: 1.2 10*3/uL — ABNORMAL HIGH (ref 0.1–1.0)
Monocytes Relative: 11 %
Neutro Abs: 8.4 10*3/uL — ABNORMAL HIGH (ref 1.7–7.7)
Neutrophils Relative %: 76 %
Platelets: 182 10*3/uL (ref 150–400)
RBC: 3.85 MIL/uL — ABNORMAL LOW (ref 4.22–5.81)
RDW: 18 % — ABNORMAL HIGH (ref 11.5–15.5)
WBC: 10.9 10*3/uL — ABNORMAL HIGH (ref 4.0–10.5)
nRBC: 0 % (ref 0.0–0.2)

## 2019-10-21 LAB — PROTIME-INR
INR: 2.3 — ABNORMAL HIGH (ref 0.8–1.2)
Prothrombin Time: 25.2 seconds — ABNORMAL HIGH (ref 11.4–15.2)

## 2019-10-21 LAB — LACTIC ACID, PLASMA: Lactic Acid, Venous: 1.8 mmol/L (ref 0.5–1.9)

## 2019-10-21 MED ORDER — SODIUM CHLORIDE 0.9 % IV SOLN
1.0000 g | INTRAVENOUS | Status: DC
  Filled 2019-10-21: qty 1

## 2019-10-21 MED ORDER — DOXYCYCLINE HYCLATE 100 MG PO CAPS: 100.0000 mg | ORAL_CAPSULE | Freq: Two times a day (BID) | ORAL | 0 refills | Status: DC

## 2019-10-21 MED ORDER — SODIUM CHLORIDE 0.9 % IV SOLN
1.0000 g | Freq: Once | INTRAVENOUS | Status: AC
  Administered 2019-10-21: 1 g via INTRAVENOUS
  Filled 2019-10-21: qty 1

## 2019-10-21 MED ORDER — AMOXICILLIN-POT CLAVULANATE 500-125 MG PO TABS: 1.0000 | ORAL_TABLET | Freq: Two times a day (BID) | ORAL | 0 refills | Status: DC

## 2019-10-21 MED ORDER — VANCOMYCIN HCL 10 G IV SOLR
2000.0000 mg | Freq: Once | INTRAVENOUS | Status: AC
  Administered 2019-10-21: 18:00:00 2000 mg via INTRAVENOUS
  Filled 2019-10-21: qty 2000

## 2019-10-21 MED ORDER — VANCOMYCIN VARIABLE DOSE PER UNSTABLE RENAL FUNCTION (PHARMACIST DOSING): Status: DC

## 2019-10-21 MED ORDER — OXYCODONE-ACETAMINOPHEN 5-325 MG PO TABS
1.0000 | ORAL_TABLET | Freq: Once | ORAL | Status: AC
  Administered 2019-10-21: 18:00:00 1 via ORAL
  Filled 2019-10-21: qty 1

## 2019-10-21 NOTE — ED Triage Notes (Signed)
Pt sent by wound care clinic for further evaluation of right foot infection. Malodorous drainage noted to second and third toe. Foot is very red, edematous and hot to touch. Pt receives dialysis M/T/TH/F. Pt on 3L [place] all the time. Hypotensive in triage 85/49 but states that is his normal.

## 2019-10-21 NOTE — Progress Notes (Signed)
Pharmacy Antibiotic Note  Jeffrey [name] is a 78 y.o. male admitted on 10/21/2019 after being sent by wound care clinic for evaluation of right foot infection with malodorous drainage and erythema.  Pharmacy has been consulted for vancomycin and cefepime dosing for osteomyelitis. Foot x-ray on 10/21/2019 found questionable osteolysis at tip of second distal phalanx potentially osteomyelitis. 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 Patient reports he does not receive antibiotics with HD. WBC 10.9. Afebrile.   Plan: Cefepime 1g IV q24h (administered after HD on HD days) Vancomycin 2000mg  x1 loading dose  Vancomycin 1000mg  after HD on HD days Follow up results of MRI if obtained Monitor hemodialysis schedule, culture/sensitivites, and clinical progression      Temp (24hrs), Avg:98.7 F (37.1 C), Min:98.7 F (37.1 C), Max:98.7 F (37.1 C)  Recent Labs  Lab 10/21/19 1543 10/21/19 1544  WBC 10.9*  --   CREATININE 4.19*  --   LATICACIDVEN  --  1.8    CrCl cannot be calculated (Unknown ideal weight.).    Allergies  Allergen Reactions  . Byetta 10 Mcg Pen [Exenatide] Diarrhea and Nausea And Vomiting  . Telmisartan Other (See Comments)    Unknown reaction - reported by [facility]  . Codeine Itching    Minor reaction  . Coumadin [Warfarin Sodium] Rash    (wife states coumadin was stopped but rash did not disappear 02/21/19)    Antimicrobials this admission: Vancomycin 11/25 >> Cefepime 11/25 >>  Dose adjustments this admission: N/A  Microbiology results: 11/25 BCx: ordered  Thank you for allowing pharmacy to be a part of this patient's care.  Cristela Felt, PharmD PGY1 Pharmacy Resident [employer]: [phone]  10/21/2019 5:48 PM

## 2019-10-21 NOTE — ED Notes (Signed)
Discharge instructions discussed with Pt and wife. Pt verbalized understanding that he is leaving AMA and states he will come back tomorrow. Pt stable and leaving via WC.

## 2019-10-21 NOTE — ED Notes (Signed)
Pt's BP 92/52, pt states this is his baseline as his SBP stays in 80s-90s. Dr. Langston Masker notified.

## 2019-10-21 NOTE — ED Provider Notes (Signed)
[facility] EMERGENCY DEPARTMENT Provider Note   CSN: [id number] Arrival date & time: 10/21/19  1510     History   Chief Complaint Chief Complaint  Patient presents with  . Wound Infection    HPI Jeffrey [name] is a 78 y.o. 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  Patient was referred into the emergency department from the wound clinic after concerned he may have an infection in his right foot during his weekly office visit today.  He states he has developed redness and pain in his right foot, with erythema extending up to the midshin, over the course of the past 7 days.  He believes he has an ulcer under the third right toe which developed in the past week.  He denies any fevers or chills.  He denies any recent antibiotic use.  He has not missed any dialysis sessions, and states he has not missed any in the past 6 years.  He reports that he is on midodrine for chronic hypotension, but only takes this prior to dialysis on his dialysis days.  He lives with his wife and son who have no viral infectious type symptoms.  He reports entire family was tested negative for Covid recently.     HPI  Past Medical History:  Diagnosis Date  . Anginal pain ([place])   . Aortic stenosis    a. severe by echo 09/2014  . Atrial fibrillation ([place])    a. not well documented, not on anticoagulation  . CHF (congestive heart failure) ([place])    04/28/17 echo-EF 40%, mod LVH, diastolic dysfunction  . Claustrophobia   . Complete heart block ([place])   . COPD (chronic obstructive pulmonary disease) ([place])   . Coronary artery disease    a. chronically occluded RCA per cath 09/2014 with collaterals B.  cath 05/01/17 chr occ RCA w/collaterals, 60-70% mid LAD,   . CVA (cerebral vascular accident) ([place]) 10/2014   denies residual on 07/11/2015  . ESRD (end stage renal disease) on dialysis [facility])    a. on dialysis; [facility]; MWF, LUE fistula (07/11/2015)  . History of blood transfusion    "related to gallbladder OR"  . History of stomach ulcers   . Hyperlipidemia   . Hypertension   . Iron deficiency anemia   . Myocardial infarction ([place]) 10/2014  . Peripheral vascular disease ([place])   . Pneumonia   . Presence of permanent cardiac pacemaker   . 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 Type II diabetes mellitus [facility])     Patient Active Problem List   Diagnosis Date Noted  . Pressure injury of skin 08/19/2019  . ESRD (end stage renal disease) ([place])   . Advanced care planning/counseling discussion   . Goals of care, counseling/discussion   . Palliative care by specialist   . Diabetes mellitus type 2, uncontrolled, with complications ([place]) [id number]  . Syncope and collapse 08/18/2019  . H/O aortic valve replacement 08/18/2019  . Thyroid nodule 08/18/2019  . End-stage renal disease on hemodialysis ([place]) 08/17/2019  . Chronic hypotension 08/17/2019  . Acute systolic CHF (congestive heart failure) ([place]) 08/17/2019  .  AF (paroxysmal atrial fibrillation) ([place]) 08/17/2019  . Complete heart block ([place]) 08/17/2019  . Presence of permanent cardiac pacemaker 08/17/2019  . S/P BKA (below knee amputation), left ([place]) 08/17/2019  . Acute respiratory failure with hypoxia ([place]) 08/15/2019  . Osteomyelitis ([place]) 04/10/2019  . Acute osteomyelitis of left foot ([place]) 04/10/2019  . Cellulitis in diabetic foot ([place]) 02/24/2019  . Chronic anticoagulation 02/24/2019  . Venous stasis 02/24/2019  . Hypotension 02/24/2019  . Discoloration of skin of foot 02/20/2019  . Discoloration of skin  of multiple sites of lower extremity 02/20/2019  . RSV (respiratory syncytial virus infection) 11/30/2018  . Troponin level elevated   . COPD exacerbation ([place]) 11/29/2018  . Chronic respiratory failure with hypoxia ([place]) 10/29/2018  . Respiratory distress 07/27/2018  . GERD (gastroesophageal reflux disease) 06/23/2018  . Cough 06/23/2018  . Chest pain 04/15/2018  . NSTEMI (non-ST elevated myocardial infarction) ([place]) 04/14/2018  . Rash 04/14/2018  . COPD GOLD 0 09/26/2017  . Atrial fibrillation ([place]) [I48.91] 05/21/2017  . Encounter for therapeutic drug monitoring 05/21/2017  . Obesity (BMI 30-39.9) 04/17/2017  . Hyponatremia 03/10/2017  . COPD with acute exacerbation ([place]) 03/10/2017  . Non-healing open wound of left groin 09/05/2015  . Automatic implantable cardioverter-defibrillator in situ 08/08/2015  . 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 Hypervolemia   . Chronic combined systolic and diastolic CHF (congestive heart failure) ([place]) 07/19/2015  . Fluid overload 07/18/2015  . Acute on chronic respiratory failure with hypoxia ([place]) 07/18/2015  . 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/2015  . Atherosclerosis of native coronary artery of native heart with angina pectoris ([place])   . Protein-calorie malnutrition, severe ([place]) 01/14/2015  . ESRD (end stage renal disease) on dialysis ([name]) 01/12/2015  . Mobitz type 1 second degree AV block   . Elevated troponin 09/30/2014  . Diabetes type 2, controlled ([place])   . History of stroke 09/29/2014  . Type 2 diabetes mellitus with hypoglycemia without coma ([place]) 09/29/2014  . CAD (coronary artery disease) 09/29/2014  . Essential hypertension   . History of tobacco use 11/21/2012  . Hyperlipidemia 01/19/2010  . Arthritis, degenerative 01/19/2010  . Allergic rhinitis 12/05/2009  . Idiopathic peripheral neuropathy 01/05/2009  . 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  . Anemia of  chronic disease 11/13/2007    Past Surgical History:  Procedure Laterality Date  . ABDOMINAL AORTOGRAM W/LOWER EXTREMITY Bilateral 03/27/2019   Procedure: ABDOMINAL AORTOGRAM W/LOWER EXTREMITY;  Surgeon: Angelia Mould, MD;  Location: [facility];  Service: Cardiovascular;  Laterality: Bilateral;  . AMPUTATION Left 04/14/2019   Procedure: AMPUTATION BELOW KNEE;  Surgeon: Angelia Mould, MD;  Location: [facility];  Service: Vascular;  Laterality: Left;  . AV FISTULA PLACEMENT Left 10/19/2014   Procedure: BRACHIOCEPHALIC ARTERIOVENOUS (AV) FISTULA CREATION ;  Surgeon: Conrad Garrison, MD;  Location: [facility];  Service: Vascular;  Laterality: Left;  . CARDIAC CATHETERIZATION    . CARDIAC CATHETERIZATION N/A 07/22/2015   Procedure: Right/Left Heart Cath and Coronary Angiography;  Surgeon: [name], MD;  Location: [facility];  Service: Cardiovascular;  Laterality: N/A;  . CATARACT EXTRACTION W/ INTRAOCULAR LENS  IMPLANT, BILATERAL Bilateral 1990's  . CHOLECYSTECTOMY OPEN  1980's  . COLONOSCOPY W/ BIOPSIES AND POLYPECTOMY    . CORONARY ANGIOGRAPHY N/A 07/31/2018   Procedure: CORONARY ANGIOGRAPHY;  Surgeon: Martinique, Peter M, MD;  Location: [facility];  Service: Cardiovascular;  Laterality: N/A;  . CORONARY ANGIOPLASTY    .  CORONARY ATHERECTOMY N/A 04/18/2018   Procedure: CORONARY ATHERECTOMY;  Surgeon: [name], MD;  Location: [facility];  Service: Cardiovascular;  Laterality: N/A;  . CORONARY BALLOON ANGIOPLASTY N/A 07/31/2018   Procedure: CORONARY BALLOON ANGIOPLASTY;  Surgeon: Martinique, Peter M, MD;  Location: [facility];  Service: Cardiovascular;  Laterality: N/A;  . CORONARY STENT INTERVENTION N/A 04/18/2018   Procedure: CORONARY STENT INTERVENTION;  Surgeon: [name], MD;  Location: [facility] CV LAB;  Service: Cardiovascular;  Laterality: N/A;  . EP IMPLANTABLE DEVICE N/A 07/11/2015   Procedure: Pacemaker Implant;   Surgeon: [name], MD;  Location: [facility];  Service: Cardiovascular;  Laterality: N/A;  . ESOPHAGOGASTRODUODENOSCOPY  08/01/2012   Procedure: ESOPHAGOGASTRODUODENOSCOPY (EGD);  Surgeon: [name], MD;  Location: [facility];  Service: Endoscopy;  Laterality: N/A;  . INSERT / REPLACE / REMOVE PACEMAKER  07/11/2015  . INSERTION OF DIALYSIS CATHETER Right 02/02/2015   Procedure: INSERTION OF DIALYSIS CATHETER  RIGHT INTERNAL JUGULAR;  Surgeon: Mal [name], MD;  Location: [facility];  Service: Vascular;  Laterality: Right;  . LEFT AND RIGHT HEART CATHETERIZATION WITH CORONARY ANGIOGRAM N/A 09/30/2014   Procedure: LEFT AND RIGHT HEART CATHETERIZATION WITH CORONARY ANGIOGRAM;  Surgeon: [name], MD;  Location[facility];  Service: Cardiovascular;  Laterality: N/A;  . LEFT HEART CATH AND CORONARY ANGIOGRAPHY N/A 04/14/2018   Procedure: LEFT HEART CATH AND CORONARY ANGIOGRAPHY;  Surgeon: [name], MD;  Location: [facility];  Service: Cardiovascular;  Laterality: N/A;  . TEE WITHOUT CARDIOVERSION N/A 08/02/2015   Procedure: TRANSESOPHAGEAL ECHOCARDIOGRAM (TEE);  Surgeon: [name], MD;  Location: [facility];  Service: Open Heart Surgery;  Laterality: N/A;  . TONSILLECTOMY    . TRANSCATHETER AORTIC VALVE REPLACEMENT, TRANSFEMORAL Left 08/02/2015   Procedure: TRANSCATHETER AORTIC VALVE REPLACEMENT, TRANSFEMORAL;  Surgeon: [name], MD;  Location: [facility];  Service: Open Heart Surgery;  Laterality: Left;        Home Medications    Prior to Admission medications   Medication Sig Start Date End Date Taking? Authorizing Provider  acetaminophen (TYLENOL) 500 MG tablet Take 1 tablet (500 mg total) by mouth every 6 (six) hours as needed for mild pain or headache (pain). Patient taking differently: Take 1,000 mg by mouth every 6 (six) hours as needed for headache (pain).  04/18/19  Yes Pokhrel, Laxman, MD  albuterol (PROVENTIL) (2.5 MG/3ML) 0.083% nebulizer solution Take  3 mLs (2.5 mg total) by nebulization every 6 (six) hours as needed for wheezing or shortness of breath. 01/07/19  Yes Scot Jun, FNP  apixaban (ELIQUIS) 2.5 MG TABS tablet Take 1 tablet (2.5 mg total) by mouth 2 (two) times daily. 04/29/19  Yes Deboraha Sprang, MD  atorvastatin (LIPITOR) 80 MG tablet Take 1 tablet (80 mg total) by mouth daily at 6 PM. 01/07/19  Yes Scot Jun, FNP  cinacalcet (SENSIPAR) 30 MG tablet Take 30 mg by mouth See admin instructions. Take one tablet (30 mg) by mouth on Monday, Tuesday, Thursday, Saturday after dialysis   Yes [provider]  clopidogrel (PLAVIX) 75 MG tablet Take 1 tablet (75 mg total) by mouth daily. 04/29/19  Yes Deboraha Sprang, MD  doxercalciferol (HECTOROL) 4 MCG/2ML injection Inject 0.5 mLs (1 mcg total) into the vein Every Tuesday,Thursday,and Saturday with dialysis. 02/26/19  Yes Smith, Rondell A, MD  insulin detemir (LEVEMIR) 100 UNIT/ML injection Inject 0.2 mLs (20 Units total) into the skin  at bedtime. 04/18/19  Yes Pokhrel, Laxman, MD  Insulin Pen Needle 31G X 6 MM MISC at bedtime.  08/28/18  Yes [provider]  lidocaine-prilocaine (EMLA) cream Apply 1 application topically as needed (dialysis days).   Yes [provider]  loperamide (IMODIUM) 2 MG capsule Take 2 mg by mouth as needed for diarrhea or loose stools.  02/26/19 02/25/20 Yes [provider]  loratadine (CLARITIN) 10 MG tablet Take 10 mg by mouth daily.    Yes [provider]  metoprolol tartrate (LOPRESSOR) 25 MG tablet Take 0.5 tablets (12.5 mg total) by mouth See admin instructions. Take 1/2 tablet (12.5 mg) by mouth on Sunday, Monday, Wednesday, Friday mornings (non-dialysis days), take 1/2 tablet (12.5 mg) every evening Patient taking differently: Take by mouth See admin instructions. Take 1/2 tablet (12.5 mg) by mouth on Sunday,, Wednesday, Friday mornings (non-dialysis days), take 1/2 tablet (12.5 mg) every evening 04/29/19  Yes Deboraha Sprang, MD  midodrine (PROAMATINE) 10 MG tablet Take 10 mg by mouth See admin instructions. Take one tablet (10 mg) by mouth 30 minutes before dialysis on Monday, Tuesday, Thursday, Saturday 02/05/19  Yes [provider]  midodrine (PROAMATINE) 2.5 MG tablet Take 1 tablet (2.5 mg total) by mouth 3 (three) times daily with meals. Patient should continue to take 10 mg in the morning prior to hemodialysis sessions. Patient taking differently: Take 2.5 mg by mouth See admin instructions. Take one tablet (2.5 mg) by mouth three times daily on Sunday, Wednesday and Friday; take one tablet (2.5 mg) twice daily on Monday, Tuesday, Thursday, Saturday (after dialysis and in the evening) . Patient should continue to take 10 mg tablet in the morning prior to hemodialysis sessions. 04/24/19  Yes Scot Jun, FNP  nitroGLYCERIN (NITROSTAT) 0.4 MG SL tablet Place 1 tablet (0.4 mg total) under the tongue every 5 (five) minutes as needed for chest pain. 04/19/18  Yes Rai, Ripudeep K, MD  OXYGEN Inhale 3 L into the lungs continuous. 2-3 lpm 24/7    Yes [provider]  pantoprazole (PROTONIX) 40 MG tablet Take 1 tablet (40 mg total) by mouth daily. 04/29/19  Yes Deboraha Sprang, MD  sevelamer carbonate (RENVELA) 800 MG tablet Take 1,600-2,400 mg by mouth See admin instructions. Take 3 tablets (2400 mg) by mouth with meals and 2 tablets (1600 mg) with snacks   Yes [provider]  albuterol (PROVENTIL HFA;VENTOLIN HFA) 108 (90 Base) MCG/ACT inhaler Inhale 2 puffs into the lungs every 4 (four) hours as needed for wheezing or shortness of breath. 11/27/18   Tanda Rockers, MD  ALPRAZolam Duanne Moron) 0.25 MG tablet Take 1 tablet (0.25 mg total) by mouth 2 (two) times daily as needed for anxiety. 02/24/19   Norval Morton, MD  amoxicillin-clavulanate (AUGMENTIN) 500-125 MG tablet Take 1 tablet (500 mg total) by mouth 2 (two) times daily for 7 days. 10/22/19 10/29/19  Wyvonnia Dusky, MD  B  Complex-C-Zn-Folic Acid (DIALYVITE Q000111Q WITH ZINC) 0.8 MG TABS Take 1 tablet by mouth daily. 05/13/19   [provider]  budesonide-formoterol (SYMBICORT) 160-4.5 MCG/ACT inhaler Inhale 2 puffs into the lungs 2 (two) times daily as needed (shortness of breath).     [provider]  doxycycline (VIBRAMYCIN) 100 MG capsule Take 1 capsule (100 mg total) by mouth 2 (two) times daily for 7 days. 10/22/19 10/29/19  Wyvonnia Dusky, MD  hydrOXYzine (ATARAX/VISTARIL) 25 MG tablet Take 1 tablet (25 mg total) by mouth every  8 (eight) hours as needed for anxiety. Patient not taking: Reported on 10/21/2019 04/22/19   Scot Jun, FNP  LEVEMIR FLEXTOUCH 100 UNIT/ML Pen INJECT 48 UNITS TOTAL INTO THE SKIN AT BEDTIME. Patient taking differently: Inject 18 Units into the skin at bedtime.  07/17/19   Charlott Rakes, MD  Melatonin 5 MG TABS Take 5-10 mg by mouth at bedtime as needed (sleep).     [provider]  mirtazapine (REMERON) 15 MG tablet Take 7.5 mg by mouth at bedtime. For appetite/sleep 06/04/19   [provider]  oxyCODONE-acetaminophen (PERCOCET/ROXICET) 5-325 MG tablet Take 1-2 tablets by mouth every 6 (six) hours as needed for severe pain. Patient not taking: Reported on Q000111Q [id number]   Delora Fuel, MD  polyethylene glycol powder [facility]) 17 GM/SCOOP powder Take 17 g by mouth 2 (two) times daily. Patient not taking: Reported on 10/21/2019 04/19/19   Montine Circle, PA-C    Family History Family History  Problem Relation Age of Onset  . Diabetes Father   . Heart disease Father   . Diabetes Sister   . Alzheimer's disease Mother   . Stroke Mother   . Diabetes Paternal Grandmother   . Alzheimer's disease Maternal Aunt        x 8 Maternal Aunts  . Cancer Maternal Uncle        type unknown    Social History Social History   Tobacco Use  . Smoking status: Former Smoker    Packs/day: 2.00    Years: 32.00    Pack years: 64.00    Quit  date: 11/27/1983    Years since quitting: 35.9  . Smokeless tobacco: Never Used  Substance Use Topics  . Alcohol use: Yes    Comment: rarely  . Drug use: No     Allergies   Byetta 10 mcg pen [exenatide], Telmisartan, Codeine, and Coumadin [warfarin sodium]   Review of Systems Review of Systems  Constitutional: Negative for chills and fever.  Respiratory: Negative for cough and shortness of breath.   Cardiovascular: Negative for chest pain and palpitations.  Musculoskeletal: Positive for arthralgias and myalgias.  Skin: Positive for rash and wound.  Neurological: Negative for syncope and light-headedness.  Psychiatric/Behavioral: Negative for agitation and confusion.  All other systems reviewed and are negative.    Physical Exam Updated Vital Signs BP 91/73   Pulse (!) 56   Temp 98.7 F (37.1 C) (Oral)   Resp 20   Ht 5\' 11"  (1.803 m)   Wt 107 kg   SpO2 100%   BMI 32.92 kg/m   Physical Exam Vitals signs and nursing note reviewed.  Constitutional:      Appearance: He is well-developed.  HENT:     Head: Normocephalic and atraumatic.  Eyes:     Conjunctiva/sclera: Conjunctivae normal.  Neck:     Musculoskeletal: Neck supple.  Cardiovascular:     Rate and Rhythm: Normal rate and regular rhythm.     Pulses: Normal pulses.  Pulmonary:     Effort: Pulmonary effort is normal. No respiratory distress.  Musculoskeletal:     Comments: Left lower leg amputation Right lower extremity with warmth and erythema Ulceration on plantar surface of 3rd toe See photo  Skin:    General: Skin is warm and dry.  Neurological:     General: No focal deficit present.     Mental Status: He is alert and oriented to person, place, and time.          ED  Treatments / Results  Labs (all labs ordered are listed, but only abnormal results are displayed) Labs Reviewed  COMPREHENSIVE METABOLIC PANEL - Abnormal; Notable for the following components:      Result Value   Sodium 133  (*)    Chloride 92 (*)    Glucose, Bld 127 (*)    BUN 25 (*)    Creatinine, Ser 4.19 (*)    Calcium 8.0 (*)    Albumin 2.4 (*)    Alkaline Phosphatase 146 (*)    Total Bilirubin 2.9 (*)    GFR calc non Af Amer 13 (*)    GFR calc Af Amer 15 (*)    All other components within normal limits  CBC WITH DIFFERENTIAL/PLATELET - Abnormal; Notable for the following components:   WBC 10.9 (*)    RBC 3.85 (*)    Hemoglobin 11.7 (*)    HCT 36.5 (*)    RDW 18.0 (*)    Neutro Abs 8.4 (*)    Monocytes Absolute 1.2 (*)    All other components within normal limits  PROTIME-INR - Abnormal; Notable for the following components:   Prothrombin Time 25.2 (*)    INR 2.3 (*)    All other components within normal limits  CULTURE, BLOOD (ROUTINE X 2)  CULTURE, BLOOD (ROUTINE X 2)  LACTIC ACID, PLASMA    EKG EKG Interpretation  Date/Time:  Wednesday October 21 2019 16:08:46 EST Ventricular Rate:  59 PR Interval:    QRS Duration: 170 QT Interval:  489 QTC Calculation: 485 R Axis:   -126 Text Interpretation: Junctional rhythm Right bundle branch block No STEMI Confirmed by Octaviano Glow [phone]) on 10/21/2019 4:34:20 PM   Radiology Dg Foot Complete Right  Result Date: 10/21/2019 CLINICAL DATA:  Right foot infection. EXAM: RIGHT FOOT COMPLETE - 3+ VIEW COMPARISON:  None. FINDINGS: Questionable osteolysis at the tip of second distal phalanx. No acute fracture or dislocation. Joint spaces are preserved. Mild dorsal midfoot degenerative spurring. Bone mineralization is normal. Plantar enthesophyte. No subcutaneous emphysema. Diffuse atherosclerotic vascular calcification. IMPRESSION: 1. Questionable osteolysis at the tip of the second distal phalanx, potentially reflecting osteomyelitis. Consider MRI for further evaluation. Electronically Signed   By: Titus Dubin M.D.   On: 10/21/2019 16:58    Procedures Procedures (including critical care time)  Medications Ordered in ED Medications   oxyCODONE-acetaminophen (PERCOCET/ROXICET) 5-325 MG per tablet 1 tablet (1 tablet Oral Given 10/21/19 1757)  ceFEPIme (MAXIPIME) 1 g in sodium chloride 0.9 % 100 mL IVPB (0 g Intravenous Stopped 10/21/19 2225)  vancomycin (VANCOCIN) 2,000 mg in sodium chloride 0.9 % 500 mL IVPB (0 mg Intravenous Stopped 10/21/19 2140)     Initial Impression / Assessment and Plan / ED Course  I have reviewed the triage vital signs and the nursing notes.  Pertinent labs & imaging results that were available during my care of the patient were reviewed by me and considered in my medical decision making (see chart for details).   78 yo male w/ significant pmhx as noted above presenting from wound clinic with warmth, erythematous right lower extremity and ulceration of the right 3rd toe, concerning for diabetic foot ulcer vs. Osteomyelitis vs. Cellulitis   Hypotensive on arrival but mentating well, and tells me this is his baseline BP No other SIRS criteria, I do not believe he is septic based on this initial presentation.  Infectious labs and blood cx ordered at intake - reasonable to check these  Xray of right foot Suspect  he will need IV antibiotics and possible admission for MRI  Clinical Course as of Oct 21 1020  Wed Oct 21, 2019  1744 I spoke to both the patient and his wife at the bedside.  I explained the likely diagnosis of osteomyelitis, my concerns that this may develop into sepsis if left untreated.  I recommended IV antibiotics, hospitalization, and likely MRI to characterize the foot wound.  However, the patient states he does not wish to stay in the hospital tonight.  He reports it is extremely important for him to be at home tomorrow with his grandchildren for Thanksgiving.  He understands that going home on oral antibiotics would not be a substitute for IV antibiotics, that the infection may get worse and he could decompensate quickly and die.  Given this fact, will give him 1 round of IV  antibiotics here and then discharge him home with p.o. antibiotics.  I strongly urged him to return to the hospital soon as possible for reevaluation.  He said he would do so likely tomorrow night or else Friday.   [MT]    Clinical Course User Index [MT] Cortnie Ringel, Carola Rhine, MD   This note was dictated using dragon dictation software.  Please be aware that there may be minor translation errors as a result of this oral dictation   Final Clinical Impressions(s) / ED Diagnoses   Final diagnoses:  Infection of right foot  Cellulitis of right foot    ED Discharge Orders         Ordered    amoxicillin-clavulanate (AUGMENTIN) 500-125 MG tablet  2 times daily     10/21/19 2137    doxycycline (VIBRAMYCIN) 100 MG capsule  2 times daily     10/21/19 2137           Wyvonnia Dusky, MD 10/22/19 1021

## 2019-10-21 NOTE — Discharge Instructions (Signed)
You were seen in the ER today for concerning infection in your right foot.  You had blood work and an x-ray, which showed signs of possible bone infection or osteomyelitis.  I explained to you the risk of this infection, including sepsis, or bloodstream infection, which can quickly lead to death.  I explained the importance of staying in the hospital for IV antibiotics as well as a possible MRI scan of your foot.  However you told me did not was in the hospital and wished to leave at this time.  I explained that you would be leaving [facility].  We gave you a round of IV antibiotics while here in the ER.  I also prescribed you antibiotic pills to take.  However these are not a substitute for IV medications.  You should return to the emergency department for reevaluation as soon as possible.

## 2019-10-22 ENCOUNTER — Emergency Department (HOSPITAL_COMMUNITY): Payer: Medicare Other

## 2019-10-22 ENCOUNTER — Inpatient Hospital Stay (HOSPITAL_COMMUNITY)
Admission: EM | Admit: 2019-10-22 | Discharge: 2019-10-30 | DRG: 239 | Disposition: A | Discharge status: Home Health | Payer: Medicare Other | Attending: Internal Medicine | Admitting: Internal Medicine

## 2019-10-22 ENCOUNTER — Other Ambulatory Visit: Payer: Self-pay

## 2019-10-22 DIAGNOSIS — Z7902 Long term (current) use of antithrombotics/antiplatelets: Secondary | ICD-10-CM

## 2019-10-22 DIAGNOSIS — J90 Pleural effusion, not elsewhere classified: Secondary | ICD-10-CM | POA: Diagnosis present

## 2019-10-22 DIAGNOSIS — I5042 Chronic combined systolic (congestive) and diastolic (congestive) heart failure: Secondary | ICD-10-CM | POA: Diagnosis present

## 2019-10-22 DIAGNOSIS — J9621 Acute and chronic respiratory failure with hypoxia: Secondary | ICD-10-CM | POA: Diagnosis not present

## 2019-10-22 DIAGNOSIS — E785 Hyperlipidemia, unspecified: Secondary | ICD-10-CM | POA: Diagnosis present

## 2019-10-22 DIAGNOSIS — N186 End stage renal disease: Secondary | ICD-10-CM

## 2019-10-22 DIAGNOSIS — L89150 Pressure ulcer of sacral region, unstageable: Secondary | ICD-10-CM | POA: Diagnosis not present

## 2019-10-22 DIAGNOSIS — L97516 Non-pressure chronic ulcer of other part of right foot with bone involvement without evidence of necrosis: Secondary | ICD-10-CM | POA: Diagnosis present

## 2019-10-22 DIAGNOSIS — Z79899 Other long term (current) drug therapy: Secondary | ICD-10-CM

## 2019-10-22 DIAGNOSIS — L97311 Non-pressure chronic ulcer of right ankle limited to breakdown of skin: Secondary | ICD-10-CM | POA: Diagnosis present

## 2019-10-22 DIAGNOSIS — I739 Peripheral vascular disease, unspecified: Secondary | ICD-10-CM | POA: Diagnosis not present

## 2019-10-22 DIAGNOSIS — D631 Anemia in chronic kidney disease: Secondary | ICD-10-CM | POA: Diagnosis not present

## 2019-10-22 DIAGNOSIS — J449 Chronic obstructive pulmonary disease, unspecified: Secondary | ICD-10-CM | POA: Diagnosis not present

## 2019-10-22 DIAGNOSIS — M869 Osteomyelitis, unspecified: Secondary | ICD-10-CM | POA: Diagnosis not present

## 2019-10-22 DIAGNOSIS — I442 Atrioventricular block, complete: Secondary | ICD-10-CM | POA: Diagnosis not present

## 2019-10-22 DIAGNOSIS — R059 Cough, unspecified: Secondary | ICD-10-CM

## 2019-10-22 DIAGNOSIS — Z8673 Personal history of transient ischemic attack (TIA), and cerebral infarction without residual deficits: Secondary | ICD-10-CM

## 2019-10-22 DIAGNOSIS — D509 Iron deficiency anemia, unspecified: Secondary | ICD-10-CM | POA: Diagnosis not present

## 2019-10-22 DIAGNOSIS — E1152 Type 2 diabetes mellitus with diabetic peripheral angiopathy with gangrene: Principal | ICD-10-CM | POA: Diagnosis present

## 2019-10-22 DIAGNOSIS — E8779 Other fluid overload: Secondary | ICD-10-CM | POA: Diagnosis not present

## 2019-10-22 DIAGNOSIS — Z87891 Personal history of nicotine dependence: Secondary | ICD-10-CM | POA: Diagnosis not present

## 2019-10-22 DIAGNOSIS — J9811 Atelectasis: Secondary | ICD-10-CM | POA: Diagnosis not present

## 2019-10-22 DIAGNOSIS — I252 Old myocardial infarction: Secondary | ICD-10-CM

## 2019-10-22 DIAGNOSIS — G8918 Other acute postprocedural pain: Secondary | ICD-10-CM | POA: Diagnosis not present

## 2019-10-22 DIAGNOSIS — M86171 Other acute osteomyelitis, right ankle and foot: Secondary | ICD-10-CM | POA: Diagnosis not present

## 2019-10-22 DIAGNOSIS — E11649 Type 2 diabetes mellitus with hypoglycemia without coma: Secondary | ICD-10-CM | POA: Diagnosis present

## 2019-10-22 DIAGNOSIS — L03115 Cellulitis of right lower limb: Secondary | ICD-10-CM

## 2019-10-22 DIAGNOSIS — L03116 Cellulitis of left lower limb: Secondary | ICD-10-CM | POA: Diagnosis not present

## 2019-10-22 DIAGNOSIS — J690 Pneumonitis due to inhalation of food and vomit: Secondary | ICD-10-CM | POA: Diagnosis not present

## 2019-10-22 DIAGNOSIS — Z9049 Acquired absence of other specified parts of digestive tract: Secondary | ICD-10-CM

## 2019-10-22 DIAGNOSIS — E669 Obesity, unspecified: Secondary | ICD-10-CM | POA: Diagnosis present

## 2019-10-22 DIAGNOSIS — I9589 Other hypotension: Secondary | ICD-10-CM | POA: Diagnosis present

## 2019-10-22 DIAGNOSIS — R001 Bradycardia, unspecified: Secondary | ICD-10-CM | POA: Diagnosis not present

## 2019-10-22 DIAGNOSIS — Z6832 Body mass index (BMI) 32.0-32.9, adult: Secondary | ICD-10-CM

## 2019-10-22 DIAGNOSIS — I2510 Atherosclerotic heart disease of native coronary artery without angina pectoris: Secondary | ICD-10-CM | POA: Diagnosis present

## 2019-10-22 DIAGNOSIS — Z992 Dependence on renal dialysis: Secondary | ICD-10-CM | POA: Diagnosis not present

## 2019-10-22 DIAGNOSIS — N2581 Secondary hyperparathyroidism of renal origin: Secondary | ICD-10-CM | POA: Diagnosis present

## 2019-10-22 DIAGNOSIS — L97511 Non-pressure chronic ulcer of other part of right foot limited to breakdown of skin: Secondary | ICD-10-CM | POA: Diagnosis present

## 2019-10-22 DIAGNOSIS — Z950 Presence of cardiac pacemaker: Secondary | ICD-10-CM | POA: Diagnosis not present

## 2019-10-22 DIAGNOSIS — E1122 Type 2 diabetes mellitus with diabetic chronic kidney disease: Secondary | ICD-10-CM | POA: Diagnosis not present

## 2019-10-22 DIAGNOSIS — E782 Mixed hyperlipidemia: Secondary | ICD-10-CM | POA: Diagnosis not present

## 2019-10-22 DIAGNOSIS — I4821 Permanent atrial fibrillation: Secondary | ICD-10-CM | POA: Diagnosis present

## 2019-10-22 DIAGNOSIS — E1129 Type 2 diabetes mellitus with other diabetic kidney complication: Secondary | ICD-10-CM | POA: Diagnosis not present

## 2019-10-22 DIAGNOSIS — Z953 Presence of xenogenic heart valve: Secondary | ICD-10-CM

## 2019-10-22 DIAGNOSIS — L89152 Pressure ulcer of sacral region, stage 2: Secondary | ICD-10-CM | POA: Diagnosis present

## 2019-10-22 DIAGNOSIS — Z9981 Dependence on supplemental oxygen: Secondary | ICD-10-CM

## 2019-10-22 DIAGNOSIS — Z833 Family history of diabetes mellitus: Secondary | ICD-10-CM

## 2019-10-22 DIAGNOSIS — I120 Hypertensive chronic kidney disease with stage 5 chronic kidney disease or end stage renal disease: Secondary | ICD-10-CM | POA: Diagnosis not present

## 2019-10-22 DIAGNOSIS — Z888 Allergy status to other drugs, medicaments and biological substances status: Secondary | ICD-10-CM

## 2019-10-22 DIAGNOSIS — J189 Pneumonia, unspecified organism: Secondary | ICD-10-CM | POA: Diagnosis not present

## 2019-10-22 DIAGNOSIS — R197 Diarrhea, unspecified: Secondary | ICD-10-CM | POA: Diagnosis not present

## 2019-10-22 DIAGNOSIS — E8889 Other specified metabolic disorders: Secondary | ICD-10-CM | POA: Diagnosis present

## 2019-10-22 DIAGNOSIS — I70261 Atherosclerosis of native arteries of extremities with gangrene, right leg: Secondary | ICD-10-CM | POA: Diagnosis present

## 2019-10-22 DIAGNOSIS — R52 Pain, unspecified: Secondary | ICD-10-CM | POA: Diagnosis not present

## 2019-10-22 DIAGNOSIS — J9611 Chronic respiratory failure with hypoxia: Secondary | ICD-10-CM | POA: Diagnosis present

## 2019-10-22 DIAGNOSIS — I959 Hypotension, unspecified: Secondary | ICD-10-CM | POA: Diagnosis not present

## 2019-10-22 DIAGNOSIS — E1169 Type 2 diabetes mellitus with other specified complication: Secondary | ICD-10-CM | POA: Diagnosis not present

## 2019-10-22 DIAGNOSIS — E8770 Fluid overload, unspecified: Secondary | ICD-10-CM | POA: Diagnosis not present

## 2019-10-22 DIAGNOSIS — J410 Simple chronic bronchitis: Secondary | ICD-10-CM | POA: Diagnosis not present

## 2019-10-22 DIAGNOSIS — R05 Cough: Secondary | ICD-10-CM | POA: Diagnosis not present

## 2019-10-22 DIAGNOSIS — Z8711 Personal history of peptic ulcer disease: Secondary | ICD-10-CM

## 2019-10-22 DIAGNOSIS — Z823 Family history of stroke: Secondary | ICD-10-CM

## 2019-10-22 DIAGNOSIS — F329 Major depressive disorder, single episode, unspecified: Secondary | ICD-10-CM | POA: Diagnosis present

## 2019-10-22 DIAGNOSIS — I480 Paroxysmal atrial fibrillation: Secondary | ICD-10-CM | POA: Diagnosis not present

## 2019-10-22 DIAGNOSIS — Z89512 Acquired absence of left leg below knee: Secondary | ICD-10-CM

## 2019-10-22 DIAGNOSIS — R918 Other nonspecific abnormal finding of lung field: Secondary | ICD-10-CM | POA: Diagnosis not present

## 2019-10-22 DIAGNOSIS — Z20828 Contact with and (suspected) exposure to other viral communicable diseases: Secondary | ICD-10-CM | POA: Diagnosis present

## 2019-10-22 DIAGNOSIS — Z885 Allergy status to narcotic agent status: Secondary | ICD-10-CM

## 2019-10-22 DIAGNOSIS — Z9861 Coronary angioplasty status: Secondary | ICD-10-CM

## 2019-10-22 DIAGNOSIS — Z03818 Encounter for observation for suspected exposure to other biological agents ruled out: Secondary | ICD-10-CM | POA: Diagnosis not present

## 2019-10-22 DIAGNOSIS — I25119 Atherosclerotic heart disease of native coronary artery with unspecified angina pectoris: Secondary | ICD-10-CM | POA: Diagnosis not present

## 2019-10-22 DIAGNOSIS — Z8249 Family history of ischemic heart disease and other diseases of the circulatory system: Secondary | ICD-10-CM

## 2019-10-22 DIAGNOSIS — F419 Anxiety disorder, unspecified: Secondary | ICD-10-CM | POA: Diagnosis present

## 2019-10-22 DIAGNOSIS — E0822 Diabetes mellitus due to underlying condition with diabetic chronic kidney disease: Secondary | ICD-10-CM | POA: Diagnosis not present

## 2019-10-22 DIAGNOSIS — R0602 Shortness of breath: Secondary | ICD-10-CM | POA: Diagnosis not present

## 2019-10-22 DIAGNOSIS — I10 Essential (primary) hypertension: Secondary | ICD-10-CM | POA: Diagnosis not present

## 2019-10-22 DIAGNOSIS — I96 Gangrene, not elsewhere classified: Secondary | ICD-10-CM | POA: Diagnosis not present

## 2019-10-22 DIAGNOSIS — Z7901 Long term (current) use of anticoagulants: Secondary | ICD-10-CM

## 2019-10-22 LAB — CBC WITH DIFFERENTIAL/PLATELET
Abs Immature Granulocytes: 0.09 10*3/uL — ABNORMAL HIGH (ref 0.00–0.07)
Basophils Absolute: 0.1 10*3/uL (ref 0.0–0.1)
Basophils Relative: 0 %
Eosinophils Absolute: 0.2 10*3/uL (ref 0.0–0.5)
Eosinophils Relative: 1 %
HCT: 32.1 % — ABNORMAL LOW (ref 39.0–52.0)
Hemoglobin: 10.6 g/dL — ABNORMAL LOW (ref 13.0–17.0)
Immature Granulocytes: 1 %
Lymphocytes Relative: 7 %
Lymphs Abs: 0.9 10*3/uL (ref 0.7–4.0)
MCH: 30.3 pg (ref 26.0–34.0)
MCHC: 33 g/dL (ref 30.0–36.0)
MCV: 91.7 fL (ref 80.0–100.0)
Monocytes Absolute: 1.4 10*3/uL — ABNORMAL HIGH (ref 0.1–1.0)
Monocytes Relative: 11 %
Neutro Abs: 9.8 10*3/uL — ABNORMAL HIGH (ref 1.7–7.7)
Neutrophils Relative %: 80 %
Platelets: 191 10*3/uL (ref 150–400)
RBC: 3.5 MIL/uL — ABNORMAL LOW (ref 4.22–5.81)
RDW: 18.2 % — ABNORMAL HIGH (ref 11.5–15.5)
WBC: 12.5 10*3/uL — ABNORMAL HIGH (ref 4.0–10.5)
nRBC: 0 % (ref 0.0–0.2)

## 2019-10-22 LAB — LACTIC ACID, PLASMA
Lactic Acid, Venous: 2.1 mmol/L (ref 0.5–1.9)
Lactic Acid, Venous: 1 mmol/L (ref 0.5–1.9)

## 2019-10-22 LAB — COMPREHENSIVE METABOLIC PANEL
ALT: 17 U/L (ref 0–44)
AST: 39 U/L (ref 15–41)
Albumin: 1.9 g/dL — ABNORMAL LOW (ref 3.5–5.0)
Alkaline Phosphatase: 126 U/L (ref 38–126)
Anion gap: 11 (ref 5–15)
BUN: 27 mg/dL — ABNORMAL HIGH (ref 8–23)
CO2: 27 mmol/L (ref 22–32)
Calcium: 7.9 mg/dL — ABNORMAL LOW (ref 8.9–10.3)
Chloride: 94 mmol/L — ABNORMAL LOW (ref 98–111)
Creatinine, Ser: 4.09 mg/dL — ABNORMAL HIGH (ref 0.61–1.24)
GFR calc Af Amer: 15 mL/min — ABNORMAL LOW (ref >60)
GFR calc non Af Amer: 13 mL/min — ABNORMAL LOW (ref >60)
Glucose, Bld: 150 mg/dL — ABNORMAL HIGH (ref 70–99)
Potassium: 4.1 mmol/L (ref 3.5–5.1)
Sodium: 132 mmol/L — ABNORMAL LOW (ref 135–145)
Total Bilirubin: 2.3 mg/dL — ABNORMAL HIGH (ref 0.3–1.2)
Total Protein: 5.4 g/dL — ABNORMAL LOW (ref 6.5–8.1)

## 2019-10-22 LAB — APTT: aPTT: 53 seconds — ABNORMAL HIGH (ref 24–36)

## 2019-10-22 LAB — HEMOGLOBIN A1C
Hgb A1c MFr Bld: 6.8 % — ABNORMAL HIGH (ref 4.8–5.6)
Mean Plasma Glucose: 148.46 mg/dL

## 2019-10-22 LAB — PROTIME-INR
INR: 3.2 — ABNORMAL HIGH (ref 0.8–1.2)
Prothrombin Time: 32.3 seconds — ABNORMAL HIGH (ref 11.4–15.2)

## 2019-10-22 MED ORDER — ATORVASTATIN CALCIUM 80 MG PO TABS
80.0000 mg | ORAL_TABLET | Freq: Every day | ORAL | Status: DC
  Administered 2019-10-23 – 2019-10-28 (×6): 80 mg via ORAL
  Filled 2019-10-22 (×7): qty 1

## 2019-10-22 MED ORDER — MIDODRINE HCL 5 MG PO TABS
2.5000 mg | ORAL_TABLET | ORAL | Status: DC
  Administered 2019-10-23 (×2): 2.5 mg via ORAL
  Filled 2019-10-22 (×3): qty 1

## 2019-10-22 MED ORDER — MIDODRINE HCL 5 MG PO TABS
10.0000 mg | ORAL_TABLET | ORAL | Status: DC
  Administered 2019-10-24 – 2019-10-29 (×3): 10 mg via ORAL
  Filled 2019-10-22 (×4): qty 2

## 2019-10-22 MED ORDER — VANCOMYCIN HCL IN DEXTROSE 1-5 GM/200ML-% IV SOLN
1000.0000 mg | Freq: Once | INTRAVENOUS | Status: AC
  Administered 2019-10-22: 1000 mg via INTRAVENOUS
  Filled 2019-10-22: qty 200

## 2019-10-22 MED ORDER — CINACALCET HCL 30 MG PO TABS
30.0000 mg | ORAL_TABLET | ORAL | Status: DC
  Administered 2019-10-24 – 2019-10-27 (×3): 30 mg via ORAL
  Filled 2019-10-22 (×3): qty 1

## 2019-10-22 MED ORDER — MIDODRINE HCL 5 MG PO TABS
2.5000 mg | ORAL_TABLET | ORAL | Status: DC
  Administered 2019-10-24 – 2019-10-27 (×3): 2.5 mg via ORAL
  Filled 2019-10-22 (×4): qty 1

## 2019-10-22 MED ORDER — ALPRAZOLAM 0.25 MG PO TABS
0.2500 mg | ORAL_TABLET | Freq: Two times a day (BID) | ORAL | Status: DC | PRN
  Administered 2019-10-23: 0.25 mg via ORAL
  Filled 2019-10-22: qty 1

## 2019-10-22 MED ORDER — INSULIN DETEMIR 100 UNIT/ML ~~LOC~~ SOLN
20.0000 [IU] | Freq: Every day | SUBCUTANEOUS | Status: DC
  Administered 2019-10-23 (×2): 20 [IU] via SUBCUTANEOUS
  Filled 2019-10-22 (×3): qty 0.2

## 2019-10-22 MED ORDER — ACETAMINOPHEN 325 MG PO TABS
650.0000 mg | ORAL_TABLET | Freq: Four times a day (QID) | ORAL | Status: DC | PRN
  Administered 2019-10-23 – 2019-10-24 (×2): 650 mg via ORAL
  Filled 2019-10-22 (×2): qty 2

## 2019-10-22 MED ORDER — VANCOMYCIN VARIABLE DOSE PER UNSTABLE RENAL FUNCTION (PHARMACIST DOSING): Status: DC

## 2019-10-22 MED ORDER — SODIUM CHLORIDE 0.9% FLUSH
3.0000 mL | Freq: Two times a day (BID) | INTRAVENOUS | Status: DC
  Administered 2019-10-23 – 2019-10-30 (×13): 3 mL via INTRAVENOUS

## 2019-10-22 MED ORDER — ACETAMINOPHEN 650 MG RE SUPP: 650.0000 mg | Freq: Four times a day (QID) | RECTAL | Status: DC | PRN

## 2019-10-22 MED ORDER — SODIUM CHLORIDE 0.9 % IV SOLN
2.0000 g | Freq: Once | INTRAVENOUS | Status: AC
  Administered 2019-10-22: 2 g via INTRAVENOUS
  Filled 2019-10-22: qty 20

## 2019-10-22 MED ORDER — ALBUTEROL SULFATE (2.5 MG/3ML) 0.083% IN NEBU
3.0000 mL | INHALATION_SOLUTION | RESPIRATORY_TRACT | Status: DC | PRN
  Administered 2019-10-28: 3 mL via RESPIRATORY_TRACT
  Filled 2019-10-22: qty 3

## 2019-10-22 MED ORDER — INSULIN ASPART 100 UNIT/ML ~~LOC~~ SOLN
0.0000 [IU] | Freq: Three times a day (TID) | SUBCUTANEOUS | Status: DC
  Administered 2019-10-25 – 2019-10-29 (×5): 1 [IU] via SUBCUTANEOUS

## 2019-10-22 MED ORDER — SODIUM CHLORIDE 0.9 % IV SOLN
2.0000 g | INTRAVENOUS | Status: DC
  Administered 2019-10-23 – 2019-10-28 (×6): 2 g via INTRAVENOUS
  Filled 2019-10-22 (×6): qty 20

## 2019-10-22 MED ORDER — MOMETASONE FURO-FORMOTEROL FUM 200-5 MCG/ACT IN AERO
2.0000 | INHALATION_SPRAY | Freq: Two times a day (BID) | RESPIRATORY_TRACT | Status: DC
  Administered 2019-10-23 – 2019-10-30 (×14): 2 via RESPIRATORY_TRACT
  Filled 2019-10-22: qty 8.8

## 2019-10-22 NOTE — ED Notes (Signed)
Report attempted 

## 2019-10-22 NOTE — H&P (Signed)
History and Physical    Jeffrey [name] [id number] DOB: [birth year] DOA: 10/22/2019  PCP: Ronita Hipps, MD  Patient coming from: Home  I have personally briefly reviewed patient's old medical records in [place]  Chief Complaint: Right foot wound  HPI: Jeffrey [name] is a 78 y.o. male with medical history significant for ESRD on [facility] HD, CAD s/p PCI, AS s/p TAVR, CHB s/p PPM, permanent A. 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  Patient has a history of peripheral vascular disease with chronic right foot wound for which he has been following with wound care regularly.  Patient states that 2 days ago he began to develop erythema at his right foot which has been progressing outpatient.  He was seen in the ED yesterday (10/21/2019) for evaluation.  X-ray of the right foot showed possible osteomyelitis of the second distal phalanx.  Patient was given IV vancomycin and cefepime while in the ED.  EDP discussed with him the recommendation for admission for further IV antibiotics and management however patient declined admission as it was important for him to be home for Thanksgiving.  He was given a prescription for doxycycline and Augmentin which has been filled but he has not yet started.  Patient went to his usual dialysis session today and return to the ED for further management of his right foot wound and cellulitis.  Patient reports chronic shortness of breath which is unchanged requiring 3 L supplemental O2 at all times.  He denies any chest pain, abdominal pain, fevers.  ED Course:  Initial vitals showed BP 80/33, pulse 54, RR 16, temp 98.1 Fahrenheit, SPO2 98% on 3 L of home  O2 via .  Labs notable for WBC 12.5, hemoglobin 10.6, platelets 191,000, sodium 132, potassium 4.1, bicarb 27, BUN 27, creatinine 4.09, serum glucose 150, lactic acid 2.1 >> 1.0.  Blood cultures were obtained and pending.  SARS-CoV-2 PCR test was ordered and pending.  Portable chest x-ray showed prior TAVR changes, right-sided pacemaker in place, and possible right-sided pleural effusion.  Patient was started on IV vancomycin and ceftriaxone in the hospital service was consulted for further evaluation management.  Review of Systems: All systems reviewed and are negative except as documented in history of present illness above.   Past Medical History:  Diagnosis Date   Anginal pain ([place])    Aortic stenosis    a. severe by echo 09/2014   Atrial fibrillation ([place])    a. not well documented, not on anticoagulation   CHF (congestive heart failure) ([place])    04/28/17 echo-EF 40%, mod LVH, diastolic dysfunction   Claustrophobia    Complete heart block (HCC)    COPD (chronic obstructive pulmonary disease) ([place])    Coronary artery disease    a. chronically occluded RCA per cath 09/2014 with collaterals B. cath 05/01/17 chr occ RCA w/collaterals, 60-70% mid LAD,    CVA (cerebral vascular accident) ([place]) 10/2014   denies residual on 07/11/2015   ESRD (end stage renal disease) on dialysis [facility])    a. 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   29 mm Edwards Sapien 3 transcatheter heart valve placed via open left transfemoral approach   Type II diabetes mellitus ([place])     Past Surgical History:  Procedure  Laterality Date   ABDOMINAL AORTOGRAM W/LOWER EXTREMITY Bilateral 03/27/2019   Procedure: ABDOMINAL AORTOGRAM W/LOWER EXTREMITY;  Surgeon: Angelia Mould, MD;  Location: [place] CV LAB;  Service: Cardiovascular;  Laterality: Bilateral;   AMPUTATION Left 04/14/2019   Procedure: AMPUTATION BELOW KNEE;  Surgeon: Angelia Mould, MD;  Location: [place];  Service: Vascular;  Laterality: Left;   AV FISTULA PLACEMENT Left 10/19/2014   Procedure: BRACHIOCEPHALIC ARTERIOVENOUS (AV) FISTULA CREATION ;  Surgeon: Conrad [place] Ranch, MD;  Location: [place];  Service: Vascular;  Laterality: Left;   CARDIAC CATHETERIZATION     CARDIAC CATHETERIZATION N/A 07/22/2015   Procedure: Right/Left Heart Cath and Coronary Angiography;  Surgeon: [name], MD;  Location: [place] CV LAB;  Service: Cardiovascular;  Laterality: N/A;   CATARACT EXTRACTION W/ INTRAOCULAR LENS  IMPLANT, BILATERAL Bilateral 1990's   CHOLECYSTECTOMY OPEN  1980's   COLONOSCOPY W/ BIOPSIES AND POLYPECTOMY     CORONARY ANGIOGRAPHY N/A 07/31/2018   Procedure: CORONARY ANGIOGRAPHY;  Surgeon: Martinique, Peter M, MD;  Location: [place] CV LAB;  Service: Cardiovascular;  Laterality: N/A;   CORONARY ANGIOPLASTY     CORONARY ATHERECTOMY N/A 04/18/2018   Procedure: CORONARY ATHERECTOMY;  Surgeon: [name], MD;  Location: [place] CV LAB;  Service: Cardiovascular;  Laterality: N/A;   CORONARY BALLOON ANGIOPLASTY N/A 07/31/2018   Procedure: CORONARY BALLOON ANGIOPLASTY;  Surgeon: Martinique, Peter M, MD;  Location: [place] CV LAB;  Service: Cardiovascular;  Laterality: N/A;   CORONARY STENT INTERVENTION N/A 04/18/2018   Procedure: CORONARY STENT INTERVENTION;  Surgeon: [name], MD;  Location: [place] CV LAB;  Service: Cardiovascular;  Laterality: N/A;   EP IMPLANTABLE DEVICE N/A 07/11/2015   Procedure: Pacemaker Implant;  Surgeon: [name], MD;  Location: [place] CV LAB;   Service: Cardiovascular;  Laterality: N/A;   ESOPHAGOGASTRODUODENOSCOPY  08/01/2012   Procedure: ESOPHAGOGASTRODUODENOSCOPY (EGD);  Surgeon: [name], MD;  Location: [facility];  Service: Endoscopy;  Laterality: N/A;   INSERT / REPLACE / REMOVE PACEMAKER  07/11/2015   INSERTION OF DIALYSIS CATHETER Right 02/02/2015   Procedure: INSERTION OF DIALYSIS CATHETER  RIGHT INTERNAL JUGULAR;  Surgeon: Mal [name], MD;  Location: [place];  Service: Vascular;  Laterality: Right;   LEFT AND RIGHT HEART CATHETERIZATION WITH CORONARY ANGIOGRAM N/A 09/30/2014   Procedure: LEFT AND RIGHT HEART CATHETERIZATION WITH CORONARY ANGIOGRAM;  Surgeon: [name], MD;  Location: [facility] CATH LAB;  Service: Cardiovascular;  Laterality: N/A;   LEFT HEART CATH AND CORONARY ANGIOGRAPHY N/A 04/14/2018   Procedure: LEFT HEART CATH AND CORONARY ANGIOGRAPHY;  Surgeon: [name], MD;  Location: [place] CV LAB;  Service: Cardiovascular;  Laterality: N/A;   TEE WITHOUT CARDIOVERSION N/A 08/02/2015   Procedure: TRANSESOPHAGEAL ECHOCARDIOGRAM (TEE);  Surgeon: [name], MD;  Location: [place];  Service: Open Heart Surgery;  Laterality: N/A;   TONSILLECTOMY     TRANSCATHETER AORTIC VALVE REPLACEMENT, TRANSFEMORAL Left 08/02/2015   Procedure: TRANSCATHETER AORTIC VALVE REPLACEMENT, TRANSFEMORAL;  Surgeon: [name],  MD;  Location: [facility];  Service: Open Heart Surgery;  Laterality: Left;    Social History:  reports that he quit smoking about 35 years ago. He has a 64.00 pack-year smoking history. He has never used smokeless tobacco. He reports current alcohol use. He reports that he does not use drugs.  Allergies  Allergen Reactions   Byetta 10 Mcg Pen [Exenatide] Diarrhea and Nausea And Vomiting   Telmisartan Other (See Comments)    Unknown reaction - reported by [facility]   Codeine Itching    Minor reaction   Coumadin [Warfarin Sodium] Rash    (wife states coumadin was stopped but rash did not disappear  02/21/19)    Family History  Problem Relation Age of Onset   Diabetes Father    Heart disease Father    Diabetes Sister    Alzheimer's disease Mother    Stroke Mother    Diabetes Paternal Grandmother    Alzheimer's disease Maternal Aunt        x 8 Maternal Aunts   Cancer Maternal Uncle        type unknown     Prior to Admission medications   Medication Sig Start Date End Date Taking? Authorizing Provider  acetaminophen (TYLENOL) 500 MG tablet Take 1 tablet (500 mg total) by mouth every 6 (six) hours as needed for mild pain or headache (pain). Patient taking differently: Take 1,000 mg by mouth every 6 (six) hours as needed for headache (pain).  04/18/19  Yes Pokhrel, Laxman, MD  albuterol (PROVENTIL HFA;VENTOLIN HFA) 108 (90 Base) MCG/ACT inhaler Inhale 2 puffs into the lungs every 4 (four) hours as needed for wheezing or shortness of breath. 11/27/18  Yes Tanda Rockers, MD  albuterol (PROVENTIL) (2.5 MG/3ML) 0.083% nebulizer solution Take 3 mLs (2.5 mg total) by nebulization every 6 (six) hours as needed for wheezing or shortness of breath. 01/07/19  Yes Scot Jun, FNP  ALPRAZolam Duanne Moron) 0.25 MG tablet Take 1 tablet (0.25 mg total) by mouth 2 (two) times daily as needed for anxiety. Patient taking differently: Take 0.25 mg by mouth 2 (two) times daily as needed (severe anxiety/panic attacks).  02/24/19  Yes Fuller Plan A, MD  amoxicillin-clavulanate (AUGMENTIN) 500-125 MG tablet Take 1 tablet (500 mg total) by mouth 2 (two) times daily for 7 days. 10/22/19 10/29/19 Yes Trifan, Carola Rhine, MD  apixaban (ELIQUIS) 2.5 MG TABS tablet Take 1 tablet (2.5 mg total) by mouth 2 (two) times daily. 04/29/19  Yes Deboraha Sprang, MD  atorvastatin (LIPITOR) 80 MG tablet Take 1 tablet (80 mg total) by mouth daily at 6 PM. 01/07/19  Yes Scot Jun, FNP  budesonide-formoterol [facility]) 160-4.5 MCG/ACT inhaler Inhale 2 puffs into the lungs 2 (two) times daily as needed (shortness of  breath).    Yes [provider]  cinacalcet (SENSIPAR) 30 MG tablet Take 30 mg by mouth See admin instructions. Take one tablet (30 mg) by mouth on Monday, Tuesday, Thursday, Saturday after dialysis   Yes [provider]  clopidogrel (PLAVIX) 75 MG tablet Take 1 tablet (75 mg total) by mouth daily. 04/29/19  Yes Deboraha Sprang, MD  doxycycline (VIBRAMYCIN) 100 MG capsule Take 1 capsule (100 mg total) by mouth 2 (two) times daily for 7 days. 10/22/19 10/29/19 Yes Trifan, Carola Rhine, MD  LEVEMIR FLEXTOUCH 100 UNIT/ML Pen INJECT 48 UNITS TOTAL INTO THE SKIN AT BEDTIME. Patient taking differently: Inject 20 Units into the skin at bedtime.  07/17/19  Yes Newlin,  Enobong, MD  lidocaine-prilocaine (EMLA) cream Apply 1 application topically See admin instructions. Apply to port access prior to dialysis on Monday, Tuesday, Thursday, Saturday   Yes [provider]  loperamide (IMODIUM) 2 MG capsule Take 2 mg by mouth 4 (four) times daily as needed for diarrhea or loose stools.  02/26/19 02/25/20 Yes [provider]  loratadine (CLARITIN) 10 MG tablet Take 10 mg by mouth daily.    Yes [provider]  metoprolol tartrate (LOPRESSOR) 25 MG tablet Take 0.5 tablets (12.5 mg total) by mouth See admin instructions. Take 1/2 tablet (12.5 mg) by mouth on Sunday, Monday, Wednesday, Friday mornings (non-dialysis days), take 1/2 tablet (12.5 mg) every evening Patient taking differently: Take 12.5 mg by mouth See admin instructions. Take 1/2 tablet (12.5 mg) by mouth on Sunday, Wednesday, Friday mornings (non-dialysis days), take 1/2 tablet (12.5 mg) every evening 04/29/19  Yes Deboraha Sprang, MD  midodrine (PROAMATINE) 10 MG tablet Take 10 mg by mouth See admin instructions. Take one tablet (10 mg) by mouth 30 minutes before dialysis on Monday, Tuesday, Thursday, Saturday 02/05/19  Yes [provider]  midodrine (PROAMATINE) 2.5 MG tablet Take 1 tablet (2.5 mg total) by mouth 3  (three) times daily with meals. Patient should continue to take 10 mg in the morning prior to hemodialysis sessions. Patient taking differently: Take 2.5 mg by mouth See admin instructions. Take one tablet (2.5 mg) by mouth three times daily on Sunday, Wednesday, Friday (non-dialysis days), take one tablet (2.5 mg) after dialysis on Monday, Tuesday, Thursday, Saturday (continue to take 10 mg on Monday, Tuesday, Thursday, Saturday before dialysis) 04/24/19  Yes Scot Jun, FNP  nitroGLYCERIN (NITROSTAT) 0.4 MG SL tablet Place 1 tablet (0.4 mg total) under the tongue every 5 (five) minutes as needed for chest pain. 04/19/18  Yes Rai, Ripudeep K, MD  OXYGEN Inhale 3 L into the lungs continuous.    Yes [provider]  pantoprazole (PROTONIX) 40 MG tablet Take 1 tablet (40 mg total) by mouth daily. 04/29/19  Yes Deboraha Sprang, MD  sevelamer carbonate (RENVELA) 800 MG tablet Take 2,400 mg by mouth See admin instructions. Take three tablets (2400 mg) by mouth once or twice daily with meals   Yes [provider]  doxercalciferol (HECTOROL) 4 MCG/2ML injection Inject 0.5 mLs (1 mcg total) into the vein Every Tuesday,Thursday,and Saturday with dialysis. 02/26/19   Norval Morton, MD  hydrOXYzine (ATARAX/VISTARIL) 25 MG tablet Take 1 tablet (25 mg total) by mouth every 8 (eight) hours as needed for anxiety. Patient not taking: Reported on 10/21/2019 04/22/19   Scot Jun, FNP  insulin detemir (LEVEMIR) 100 UNIT/ML injection Inject 0.2 mLs (20 Units total) into the skin at bedtime. Patient not taking: Reported on 10/22/2019 04/18/19   Flora Lipps, MD  Insulin Pen Needle 31G X 6 MM MISC at bedtime.  08/28/18   [provider]  oxyCODONE-acetaminophen (PERCOCET/ROXICET) 5-325 MG tablet Take 1-2 tablets by mouth every 6 (six) hours as needed for severe pain. Patient not taking: Reported on [id number]   Delora Fuel, MD  polyethylene glycol powder [facility])  17 GM/SCOOP powder Take 17 g by mouth 2 (two) times daily. Patient not taking: Reported on 10/21/2019 04/19/19   Montine Circle, PA-C    Physical Exam: Vitals:   10/22/19 2200 10/22/19 2215 10/22/19 2230 10/22/19 2245  BP: (!) 89/46 (!) 84/48 (!) 89/48 (!) 89/53  Pulse: (!) 54 (!) 54    Resp: 14  12 12 (!) 26  Temp:      TempSrc:      SpO2: 100% 100%    Weight:      Height:        Constitutional: Chronically ill-appearing man resting supine in bed with head elevated, calm, appears tired but comfortable Eyes: PERRL, lids and conjunctivae normal ENMT: Mucous membranes are moist. Posterior pharynx clear of any exudate or lesions. Neck: normal, supple, no masses. Respiratory: clear to auscultation bilaterally, no wheezing, no crackles. Normal respiratory effort. No accessory muscle use.  Cardiovascular: Irregularly irregular, mild bradycardia, no murmurs.  +1 pitting edema RLE.  PPM in place right chest wall.  LUE fistula with palpable thrill. Abdomen: no tenderness, no masses palpated. No hepatosplenomegaly. Bowel sounds positive.  Musculoskeletal: S/p left BKA, no contractions, ROM intact in upper extremities and RLE skin: Erythema of RLE from foot to halfway up the shin, necrotic appearing distal right second toe, nonulcerated blistering of third right second toe Neurologic: CN 2-12 grossly intact. Sensation intact, Strength 5/5 upper extremities. Psychiatric: Normal judgment and insight. Alert and oriented x 3. Normal mood.        Labs on Admission: I have personally reviewed following labs and imaging studies  CBC: Recent Labs  Lab 10/21/19 1543 10/22/19 1931  WBC 10.9* 12.5*  NEUTROABS 8.4* 9.8*  HGB 11.7* 10.6*  HCT 36.5* 32.1*  MCV 94.8 91.7  PLT 182 [id number]   Basic Metabolic Panel: Recent Labs  Lab 10/21/19 1543 10/22/19 1931  NA 133* 132*  K 4.3 4.1  CL 92* 94*  CO2 27 27  GLUCOSE 127* 150*  BUN 25* 27*  CREATININE 4.19* 4.09*  CALCIUM 8.0* 7.9*    GFR: Estimated Creatinine Clearance: 17.9 mL/min (A) (by C-G formula based on SCr of 4.09 mg/dL (H)). Liver Function Tests: Recent Labs  Lab 10/21/19 1543 10/22/19 1931  AST 35 39  ALT 15 17  ALKPHOS 146* 126  BILITOT 2.9* 2.3*  PROT 6.5 5.4*  ALBUMIN 2.4* 1.9*   No results for input(s): LIPASE, AMYLASE in the last 168 hours. No results for input(s): AMMONIA in the last 168 hours. Coagulation Profile: Recent Labs  Lab 10/21/19 1543 10/22/19 1931  INR 2.3* 3.2*   Cardiac Enzymes: No results for input(s): CKTOTAL, CKMB, CKMBINDEX, TROPONINI in the last 168 hours. BNP (last 3 results) No results for input(s): PROBNP in the last 8760 hours. HbA1C: No results for input(s): HGBA1C in the last 72 hours. CBG: No results for input(s): GLUCAP in the last 168 hours. Lipid Profile: No results for input(s): CHOL, HDL, LDLCALC, TRIG, CHOLHDL, LDLDIRECT in the last 72 hours. Thyroid Function Tests: No results for input(s): TSH, T4TOTAL, FREET4, T3FREE, THYROIDAB in the last 72 hours. Anemia Panel: No results for input(s): VITAMINB12, FOLATE, FERRITIN, TIBC, IRON, RETICCTPCT in the last 72 hours. Urine analysis:    Component Value Date/Time   COLORURINE YELLOW 06/17/2017 [place] 06/17/2017 0547   LABSPEC 1.010 06/17/2017 0547   PHURINE 6.0 06/17/2017 0547   GLUCOSEU >=500 (A) 06/17/2017 0547   HGBUR SMALL (A) 06/17/2017 0547   BILIRUBINUR NEGATIVE 06/17/2017 0547   KETONESUR NEGATIVE 06/17/2017 0547   PROTEINUR 100 (A) 06/17/2017 0547   UROBILINOGEN 0.2 09/28/2014 2257   NITRITE NEGATIVE 06/17/2017 0547   LEUKOCYTESUR TRACE (A) 06/17/2017 0547    Radiological Exams on Admission: Dg Chest Port 1 View  Result Date: 10/22/2019 CLINICAL DATA:  End-stage renal disease with hypertension. EXAM: PORTABLE CHEST 1 VIEW COMPARISON:  09/08/2019 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 size remains enlarged. Aortic calcifications  are noted. The lung bases are only partially visualized, however there appears to be a right-sided pleural effusion with a possible right lower lobe airspace opacity. The lung apices are suboptimally evaluated secondary to patient positioning. There is no pneumothorax. The patient is status post prior TAVR. IMPRESSION: 1. Limited study as detailed above. 2. Probable right-sided pleural effusion. There is a right lower lung zone airspace opacity which may represent atelectasis or infiltrate. 3. Persistent cardiomegaly. Electronically Signed   By: Constance Holster M.D.   On: 10/22/2019 19:45   Dg Foot Complete Right  Result Date: 10/21/2019 CLINICAL DATA:  Right foot infection. EXAM: RIGHT FOOT COMPLETE - 3+ VIEW COMPARISON:  None. FINDINGS: Questionable osteolysis at the tip of second distal phalanx. No acute fracture or dislocation. Joint spaces are preserved. Mild dorsal midfoot degenerative spurring. Bone mineralization is normal. Plantar enthesophyte. No subcutaneous emphysema. Diffuse atherosclerotic vascular calcification. IMPRESSION: 1. Questionable osteolysis at the tip of the second distal phalanx, potentially reflecting osteomyelitis. Consider MRI for further evaluation. Electronically Signed   By: Titus Dubin M.D.   On: 10/21/2019 16:58    EKG: Independently reviewed. A. fib with V paced complexes, rate 55.  Not significantly changed when compared to prior.  Assessment/Plan Principal Problem:   Osteomyelitis of right foot (HCC) Active Problems:   ESRD (end stage renal disease) on dialysis [facility])   Atherosclerosis of native coronary artery of native heart with angina pectoris (HCC)   Chronic combined systolic and diastolic CHF (congestive heart failure) (HCC)   Hyperlipidemia   COPD (chronic obstructive pulmonary disease) (HCC)   Chronic respiratory failure with hypoxia (HCC)   Chronic hypotension   Complete heart block (HCC)   Presence of permanent cardiac pacemaker   PVD  (peripheral vascular disease) (HCC)  TZION MCADEN is a 78 y.o. male with medical history significant for ESRD on [facility] HD, CAD s/p PCI, AS s/p TAVR, CHB s/p PPM, permanent A. 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   Cellulitis of right foot with osteomyelitis of third distal phalanx Peripheral vascular disease s/p left BKA: Reported new cellulitis of right lower extremity with chronic necrotic appearing distal second toe wound x-ray findings suggestive of possible osteomyelitis of the third right toe.  Follows with vascular surgery, Dr. Scot Dock as an outpatient. -Continue IV vancomycin and ceftriaxone -MRI right foot not ordered due to patient's reported significant claustrophobia and PPM in place, unclear if MRI compatible -Can consider CT imaging for further evaluation -Will need vascular surgery consultation for further recommendations regarding definitive management -Will hold home Eliquis and Plavix for now in the event he will need/is considered a candidate for surgical intervention -Follow-up blood cultures  ESRD on MTThS HD: Reported attending usual dialysis today.  Currently no emergent indication for dialysis.  Will need nephrology consultation in a.m. to resume dialysis as scheduled.  Chronic combined systolic and diastolic CHF: Chronically short of breath with possible right pleural effusion pitting edema of RLE.  Does not appear to be decompensated at this time.  Further volume control with dialysis.  Will hold home Lopressor for now as he is hypotensive on admission.  Permanent atrial fibrillation Complete heart block s/p PPM: Remains in atrial fibrillation with V paced  complexes.  Rate controlled and borderline bradycardia.  Holding Lopressor and Eliquis as above for now.  COPD/chronic respiratory failure: Chronic and stable on home 3 L supplemental O2 via [place].  Continue no N as needed albuterol.  Chronic hypotension: Requiring midodrine, will continue home midodrine regimen.  CAD s/p PCI: Chronic and appears to be stable.  Holding Lopressor and Plavix for now as above.  Insulin-dependent type 2 diabetes: Continue home Levemir and very sensitive SSI.  A1c is 6.8%.  Hyperlipidemia: Continue atorvastatin.  Anxiety: Continue as needed Xanax.  DVT prophylaxis: SCD Code Status: Full code, confirmed with patient.  He is following with palliative care as an outpatient. Family Communication: Discussed with patient Disposition Plan: Pending clinical progress Consults called: None Admission status: Admit - It is my clinical opinion that admission to INPATIENT is reasonable and necessary because of the expectation that this patient will require hospital care that crosses at least 2 midnights to treat this condition based on the medical complexity of the problems presented.  Given the aforementioned information, the predictability of an adverse outcome is felt to be significant.     Zada Finders MD [facility]  If 7PM-7AM, please contact night-coverage [email or web address]  10/22/2019, 10:55 PM

## 2019-10-22 NOTE — Progress Notes (Signed)
Pharmacy Antibiotic Note  Jeffrey [name] is a 78 y.o. male presenting on 10/22/2019 with foot wound infection, questionable osteomyelitis. Pharmacy has been consulted for vancomycin dosing. Patient received loading dose of vancomycin last night but then left AMA (discharged with Augmentin and doxy, did not take any doses today). ESRD on HD 4x weekly (MTThF) PTA - patient did reportedly go to HD today outpatient prior to returning to the ER. Also has 1x dose of ceftriaxone ordered per EDP.  Plan: Continue vancomycin 1g IV qHD - will schedule 1st maintenance dose for now, as patient did HD session today PTA Ceftriaxone 2g IV x 1 per EDP - f/u if to continue Monitor clinical progress, c/s, abx plan/LOT Pre-HD vancomycin level as indicated F/u HD schedule/tolerance inpatient to schedule further vancomycin maintenance doses      Temp (24hrs), Avg:98.1 F (36.7 C), Min:98.1 F (36.7 C), Max:98.1 F (36.7 C)  Recent Labs  Lab 10/21/19 1543 10/21/19 1544  WBC 10.9*  --   CREATININE 4.19*  --   LATICACIDVEN  --  1.8    Estimated Creatinine Clearance: 17.5 mL/min (A) (by C-G formula based on SCr of 4.19 mg/dL (H)).    Allergies  Allergen Reactions  . Byetta 10 Mcg Pen [Exenatide] Diarrhea and Nausea And Vomiting  . Telmisartan Other (See Comments)    Unknown reaction - reported by [facility]  . Codeine Itching    Minor reaction  . Coumadin [Warfarin Sodium] Rash    (wife states coumadin was stopped but rash did not disappear 02/21/19)    Elicia Lamp, PharmD, BCPS Please check AMION for all [facility] contact numbers Clinical Pharmacist 10/22/2019 7:55 PM

## 2019-10-22 NOTE — ED Triage Notes (Addendum)
Pt here after leaving ama last night during workup for R foot infection. Pt has not taken any abx today. Wife accompanies pt and sts he went to dialysis today and he has been "out of it" since he got back home.

## 2019-10-22 NOTE — ED Provider Notes (Signed)
[facility] EMERGENCY DEPARTMENT Provider Note   CSN: [id number] Arrival date & time: 10/22/19  1853     History   Chief Complaint No chief complaint on file.   HPI Jeffrey [name] is a 78 y.o. male.     The history is provided by the patient, the spouse and medical records. No language interpreter was used.   Jeffrey [name] is a 78 y.o. male who presents to the Emergency Department complaining of leg infection. He presents to the emergency department accompanied by his wife complaining of infection to the right leg. 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 When he went to the wound center on Wednesday of this week his dressing was taken down and his leg was noted to be very erythematous and there were changes to the third toe and he was referred to the emergency department for further evaluation. Using the emergency department and it was recommended that he be admitted for IV antibiotics and patient refused at that time. He stated that he had things to do. He went home and he did have dialysis today and read presents to the emergency department for IV antibiotics. He was unable to fill the prescription given in the emergency department yesterday due to the pharmacy being close today. He does have a history of chronic hypotension, particularly on dialysis days. He denies any fevers, nausea, vomiting, abdominal pain. He does complain of pain, redness and swelling to the right foot. Past Medical History:  Diagnosis Date  . Anginal pain ([place])   . Aortic stenosis    a. severe by echo 09/2014  . Atrial fibrillation ([place])    a. not well documented, not on anticoagulation  . CHF (congestive heart failure) ([place])    04/28/17 echo-EF 40%, mod LVH, diastolic dysfunction  . Claustrophobia   . Complete heart block ([place])   . COPD (chronic obstructive pulmonary disease) ([place])   . Coronary artery disease     a. chronically occluded RCA per cath 09/2014 with collaterals B. cath 05/01/17 chr occ RCA w/collaterals, 60-70% mid LAD,   . CVA (cerebral vascular accident) ([place]) 10/2014   denies residual on 07/11/2015  . ESRD (end stage renal disease) on dialysis [facility])    a. on dialysis; [facility]; MWF, LUE fistula (07/11/2015)  . History of blood transfusion    "related to gallbladder OR"  . History of stomach ulcers   . Hyperlipidemia   . Hypertension   . Iron deficiency anemia   . Myocardial infarction ([place]) 10/2014  . Peripheral vascular disease ([place])   . Pneumonia   . Presence of permanent cardiac pacemaker   . 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 Type II diabetes mellitus [facility])     Patient Active Problem List   Diagnosis Date Noted  . 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 PVD (peripheral vascular disease) ([place]) 10/22/2019  . Pressure injury of skin 08/19/2019  . ESRD (end stage renal disease) ([place])   . Advanced care planning/counseling discussion   . Goals of care, counseling/discussion   . Palliative care by specialist   . Diabetes mellitus type 2, uncontrolled, with complications ([place]) 19/62/2297  . Syncope and collapse 08/18/2019  . H/O aortic valve replacement 08/18/2019  . Thyroid nodule 08/18/2019  .  End-stage renal disease on hemodialysis ([place]) 08/17/2019  . Chronic hypotension 08/17/2019  . Acute systolic CHF (congestive heart failure) ([place]) 08/17/2019  . AF (paroxysmal atrial fibrillation) ([place]) 08/17/2019  . Complete heart block ([place]) 08/17/2019  . Presence of permanent cardiac pacemaker 08/17/2019  . 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 Acute respiratory failure with hypoxia ([place]) 08/15/2019  . Osteomyelitis ([place]) 04/10/2019  . Acute osteomyelitis of left foot ([place]) 04/10/2019  . Cellulitis in diabetic foot ([place])  02/24/2019  . Chronic anticoagulation 02/24/2019  . Venous stasis 02/24/2019  . Hypotension 02/24/2019  . Discoloration of skin of foot 02/20/2019  . Discoloration of skin of multiple sites of lower extremity 02/20/2019  . RSV (respiratory syncytial virus infection) 11/30/2018  . Troponin level elevated   . COPD exacerbation ([place]) 11/29/2018  . Chronic respiratory failure with hypoxia ([place]) 10/29/2018  . Respiratory distress 07/27/2018  . GERD (gastroesophageal reflux disease) 06/23/2018  . Cough 06/23/2018  . Chest pain 04/15/2018  . NSTEMI (non-ST elevated myocardial infarction) ([place]) 04/14/2018  . Rash 04/14/2018  . COPD (chronic obstructive pulmonary disease) ([place]) 09/26/2017  . Atrial fibrillation ([place]) [I48.91] 05/21/2017  . Encounter for therapeutic drug monitoring 05/21/2017  . Obesity (BMI 30-39.9) 04/17/2017  . Hyponatremia 03/10/2017  . COPD with acute exacerbation ([place]) 03/10/2017  . Non-healing open wound of left groin 09/05/2015  . Automatic implantable cardioverter-defibrillator in situ 08/08/2015  . 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 Hypervolemia   . Chronic combined systolic and diastolic CHF (congestive heart failure) ([place]) 07/19/2015  . Fluid overload 07/18/2015  . Acute on chronic respiratory failure with hypoxia ([place]) 07/18/2015  . 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/2015  . Atherosclerosis of native coronary artery of native heart with angina pectoris ([place])   . Protein-calorie malnutrition, severe ([place]) 01/14/2015  . ESRD (end stage renal disease) on dialysis ([place]) 01/12/2015  . Mobitz type 1 second degree AV block   . Elevated troponin 09/30/2014  . Diabetes type 2, controlled ([place])   . History of stroke 09/29/2014  . Type 2 diabetes mellitus with hypoglycemia without coma ([place]) 09/29/2014  . CAD (coronary artery disease) 09/29/2014  . Essential hypertension   . History of tobacco use 11/21/2012  .  Hyperlipidemia 01/19/2010  . Arthritis, degenerative 01/19/2010  . Allergic rhinitis 12/05/2009  . Idiopathic peripheral neuropathy 01/05/2009  . 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  . Anemia of chronic disease 11/13/2007    Past Surgical History:  Procedure Laterality Date  . ABDOMINAL AORTOGRAM W/LOWER EXTREMITY Bilateral 03/27/2019   Procedure: ABDOMINAL AORTOGRAM W/LOWER EXTREMITY;  Surgeon: Angelia Mould, MD;  Location: [facility] CV LAB;  Service: Cardiovascular;  Laterality: Bilateral;  . AMPUTATION Left 04/14/2019   Procedure: AMPUTATION BELOW KNEE;  Surgeon: Angelia Mould, MD;  Location: [facility];  Service: Vascular;  Laterality: Left;  . AV FISTULA PLACEMENT Left 10/19/2014   Procedure: BRACHIOCEPHALIC ARTERIOVENOUS (AV) FISTULA CREATION ;  Surgeon: Conrad Milltown, MD;  Location: [facility];  Service: Vascular;  Laterality: Left;  . CARDIAC CATHETERIZATION    . CARDIAC CATHETERIZATION N/A 07/22/2015   Procedure: Right/Left Heart Cath and Coronary Angiography;  Surgeon: [name], MD;  Location: [facility];  Service: Cardiovascular;  Laterality: N/A;  . CATARACT EXTRACTION W/ INTRAOCULAR LENS  IMPLANT, BILATERAL Bilateral 1990's  . CHOLECYSTECTOMY OPEN  1980's  . COLONOSCOPY W/ BIOPSIES AND POLYPECTOMY    . CORONARY ANGIOGRAPHY N/A 07/31/2018  Procedure: CORONARY ANGIOGRAPHY;  Surgeon: Martinique, Peter M, MD;  Location: [facility];  Service: Cardiovascular;  Laterality: N/A;  . CORONARY ANGIOPLASTY    . CORONARY ATHERECTOMY N/A 04/18/2018   Procedure: CORONARY ATHERECTOMY;  Surgeon: [name], MD;  Location: [facility] CV LAB;  Service: Cardiovascular;  Laterality: N/A;  . CORONARY BALLOON ANGIOPLASTY N/A 07/31/2018   Procedure: CORONARY BALLOON ANGIOPLASTY;  Surgeon: Martinique, Peter M, MD;  Location: [facility] CV LAB;  Service: Cardiovascular;  Laterality: N/A;  . CORONARY STENT INTERVENTION N/A 04/18/2018    Procedure: CORONARY STENT INTERVENTION;  Surgeon: [name], MD;  Location: [facility] CV LAB;  Service: Cardiovascular;  Laterality: N/A;  . EP IMPLANTABLE DEVICE N/A 07/11/2015   Procedure: Pacemaker Implant;  Surgeon: [name], MD;  Location: [facility] LAB;  Service: Cardiovascular;  Laterality: N/A;  . ESOPHAGOGASTRODUODENOSCOPY  08/01/2012   Procedure: ESOPHAGOGASTRODUODENOSCOPY (EGD);  Surgeon: [name], MD;  Location: [facility];  Service: Endoscopy;  Laterality: N/A;  . INSERT / REPLACE / REMOVE PACEMAKER  07/11/2015  . INSERTION OF DIALYSIS CATHETER Right 02/02/2015   Procedure: INSERTION OF DIALYSIS CATHETER  RIGHT INTERNAL JUGULAR;  Surgeon: Mal [name], MD;  Location: [facility];  Service: Vascular;  Laterality: Right;  . LEFT AND RIGHT HEART CATHETERIZATION WITH CORONARY ANGIOGRAM N/A 09/30/2014   Procedure: LEFT AND RIGHT HEART CATHETERIZATION WITH CORONARY ANGIOGRAM;  Surgeon: [name], MD;  Location: [facility] CATH LAB;  Service: Cardiovascular;  Laterality: N/A;  . LEFT HEART CATH AND CORONARY ANGIOGRAPHY N/A 04/14/2018   Procedure: LEFT HEART CATH AND CORONARY ANGIOGRAPHY;  Surgeon: [name], MD;  Location: [facility] CV LAB;  Service: Cardiovascular;  Laterality: N/A;  . TEE WITHOUT CARDIOVERSION N/A 08/02/2015   Procedure: TRANSESOPHAGEAL ECHOCARDIOGRAM (TEE);  Surgeon: [name], MD;  Location: [facility];  Service: Open Heart Surgery;  Laterality: N/A;  . TONSILLECTOMY    . TRANSCATHETER AORTIC VALVE REPLACEMENT, TRANSFEMORAL Left 08/02/2015   Procedure: TRANSCATHETER AORTIC VALVE REPLACEMENT, TRANSFEMORAL;  Surgeon: [name], MD;  Location: [facility];  Service: Open Heart Surgery;  Laterality: Left;        Home Medications    Prior to Admission medications   Medication Sig Start Date End Date Taking? Authorizing Provider  acetaminophen (TYLENOL) 500 MG tablet Take 1 tablet (500 mg total) by mouth every 6 (six) hours as needed for mild  pain or headache (pain). Patient taking differently: Take 1,000 mg by mouth every 6 (six) hours as needed for headache (pain).  04/18/19  Yes Pokhrel, Laxman, MD  albuterol (PROVENTIL HFA;VENTOLIN HFA) 108 (90 Base) MCG/ACT inhaler Inhale 2 puffs into the lungs every 4 (four) hours as needed for wheezing or shortness of breath. 11/27/18  Yes Tanda Rockers, MD  albuterol (PROVENTIL) (2.5 MG/3ML) 0.083% nebulizer solution Take 3 mLs (2.5 mg total) by nebulization every 6 (six) hours as needed for wheezing or shortness of breath. 01/07/19  Yes Scot Jun, FNP  ALPRAZolam Duanne Moron) 0.25 MG tablet Take 1 tablet (0.25 mg total) by mouth 2 (two) times daily as needed for anxiety. Patient taking differently: Take 0.25 mg by mouth 2 (two) times daily as needed (severe anxiety/panic attacks).  02/24/19  Yes Norval Morton, MD  apixaban (ELIQUIS) 2.5 MG TABS tablet Take 1 tablet (2.5 mg total) by mouth 2 (two) times daily. 04/29/19  Yes Deboraha Sprang, MD  atorvastatin (LIPITOR) 80 MG tablet Take 1 tablet (80 mg total) by  mouth daily at 6 PM. 01/07/19  Yes Scot Jun, FNP  budesonide-formoterol [facility]) 160-4.5 MCG/ACT inhaler Inhale 2 puffs into the lungs 2 (two) times daily as needed (shortness of breath).    Yes [provider]  cinacalcet (SENSIPAR) 30 MG tablet Take 30 mg by mouth See admin instructions. Take one tablet (30 mg) by mouth on Monday, Tuesday, Thursday, Saturday after dialysis   Yes [provider]  clopidogrel (PLAVIX) 75 MG tablet Take 1 tablet (75 mg total) by mouth daily. 04/29/19  Yes Deboraha Sprang, MD  LEVEMIR FLEXTOUCH 100 UNIT/ML Pen INJECT 48 UNITS TOTAL INTO THE SKIN AT BEDTIME. Patient taking differently: Inject 20 Units into the skin at bedtime.  07/17/19  Yes Charlott Rakes, MD  lidocaine-prilocaine (EMLA) cream Apply 1 application topically See admin instructions. Apply to port access prior to dialysis on Monday, Tuesday, Thursday, Saturday   Yes  [provider]  loperamide (IMODIUM) 2 MG capsule Take 2 mg by mouth 4 (four) times daily as needed for diarrhea or loose stools.  02/26/19 02/25/20 Yes [provider]  loratadine (CLARITIN) 10 MG tablet Take 10 mg by mouth daily.    Yes [provider]  metoprolol tartrate (LOPRESSOR) 25 MG tablet Take 0.5 tablets (12.5 mg total) by mouth See admin instructions. Take 1/2 tablet (12.5 mg) by mouth on Sunday, Monday, Wednesday, Friday mornings (non-dialysis days), take 1/2 tablet (12.5 mg) every evening Patient taking differently: Take 12.5 mg by mouth See admin instructions. Take 1/2 tablet (12.5 mg) by mouth on Sunday, Wednesday, Friday mornings (non-dialysis days), take 1/2 tablet (12.5 mg) every evening 04/29/19  Yes Deboraha Sprang, MD  midodrine (PROAMATINE) 10 MG tablet Take 10 mg by mouth See admin instructions. Take one tablet (10 mg) by mouth 30 minutes before dialysis on Monday, Tuesday, Thursday, Saturday 02/05/19  Yes [provider]  midodrine (PROAMATINE) 2.5 MG tablet Take 1 tablet (2.5 mg total) by mouth 3 (three) times daily with meals. Patient should continue to take 10 mg in the morning prior to hemodialysis sessions. Patient taking differently: Take 2.5 mg by mouth See admin instructions. Take one tablet (2.5 mg) by mouth three times daily on Sunday, Wednesday, Friday (non-dialysis days), take one tablet (2.5 mg) after dialysis on Monday, Tuesday, Thursday, Saturday (continue to take 10 mg on Monday, Tuesday, Thursday, Saturday before dialysis) 04/24/19  Yes Scot Jun, FNP  nitroGLYCERIN (NITROSTAT) 0.4 MG SL tablet Place 1 tablet (0.4 mg total) under the tongue every 5 (five) minutes as needed for chest pain. 04/19/18  Yes Rai, Ripudeep K, MD  OXYGEN Inhale 3 L into the lungs continuous.    Yes [provider]  pantoprazole (PROTONIX) 40 MG tablet Take 1 tablet (40 mg total) by mouth daily. 04/29/19  Yes Deboraha Sprang, MD  sevelamer  carbonate (RENVELA) 800 MG tablet Take 2,400 mg by mouth See admin instructions. Take three tablets (2400 mg) by mouth once or twice daily with meals   Yes [provider]  doxercalciferol (HECTOROL) 4 MCG/2ML injection Inject 0.5 mLs (1 mcg total) into the vein Every Tuesday,Thursday,and Saturday with dialysis. 02/26/19   Fuller Plan A, MD  Insulin Pen Needle 31G X 6 MM MISC at bedtime.  08/28/18   [provider]    Family History Family History  Problem Relation Age of Onset  . Diabetes Father   . Heart disease Father   . Diabetes Sister   . Alzheimer's disease Mother   .  Stroke Mother   . Diabetes Paternal Grandmother   . Alzheimer's disease Maternal Aunt        x 8 Maternal Aunts  . Cancer Maternal Uncle        type unknown    Social History Social History   Tobacco Use  . Smoking status: Former Smoker    Packs/day: 2.00    Years: 32.00    Pack years: 64.00    Quit date: 11/27/1983    Years since quitting: 35.9  . Smokeless tobacco: Never Used  Substance Use Topics  . Alcohol use: Yes    Comment: rarely  . Drug use: No     Allergies   Byetta 10 mcg pen [exenatide], Telmisartan, Codeine, and Coumadin [warfarin sodium]   Review of Systems Review of Systems  All other systems reviewed and are negative.    Physical Exam Updated Vital Signs BP (!) 80/52   Pulse (!) 54   Temp 98.1 F (36.7 C) (Oral)   Resp 17   Ht '5\' 11"'  (1.803 m)   Wt 107 kg   SpO2 100%   BMI 32.92 kg/m   Physical Exam Vitals signs and nursing note reviewed.  Constitutional:      Appearance: He is well-developed.     Comments: Chronically ill appearing  HENT:     Head: Normocephalic and atraumatic.  Cardiovascular:     Rate and Rhythm: Normal rate and regular rhythm.  Pulmonary:     Effort: Pulmonary effort is normal. No respiratory distress.  Abdominal:     Palpations: Abdomen is soft.     Tenderness: There is no abdominal tenderness. There is no guarding or  rebound.  Musculoskeletal:     Comments: Left lower extremity BKA. Right lower extremity with moderate edema. There is marked erythema to the foot and lower leg. Trace pedal pulses in the right foot. There is purulence to the third toe and there are some somewhat necrotic changes to the second toe. 2+ femoral pulses bilaterally.  Skin:    General: Skin is warm and dry.  Neurological:     Mental Status: He is alert and oriented to person, place, and time.  Psychiatric:        Behavior: Behavior normal.      ED Treatments / Results  Labs (all labs ordered are listed, but only abnormal results are displayed) Labs Reviewed  LACTIC ACID, PLASMA - Abnormal; Notable for the following components:      Result Value   Lactic Acid, Venous 2.1 (*)    All other components within normal limits  COMPREHENSIVE METABOLIC PANEL - Abnormal; Notable for the following components:   Sodium 132 (*)    Chloride 94 (*)    Glucose, Bld 150 (*)    BUN 27 (*)    Creatinine, Ser 4.09 (*)    Calcium 7.9 (*)    Total Protein 5.4 (*)    Albumin 1.9 (*)    Total Bilirubin 2.3 (*)    GFR calc non Af Amer 13 (*)    GFR calc Af Amer 15 (*)    All other components within normal limits  CBC WITH DIFFERENTIAL/PLATELET - Abnormal; Notable for the following components:   WBC 12.5 (*)    RBC 3.50 (*)    Hemoglobin 10.6 (*)    HCT 32.1 (*)    RDW 18.2 (*)    Neutro Abs 9.8 (*)    Monocytes Absolute 1.4 (*)    Abs Immature Granulocytes 0.09 (*)  All other components within normal limits  APTT - Abnormal; Notable for the following components:   aPTT 53 (*)    All other components within normal limits  PROTIME-INR - Abnormal; Notable for the following components:   Prothrombin Time 32.3 (*)    INR 3.2 (*)    All other components within normal limits  CULTURE, BLOOD (ROUTINE X 2)  CULTURE, BLOOD (ROUTINE X 2)  SARS CORONAVIRUS 2 (TAT 6-24 HRS)  LACTIC ACID, PLASMA  CBC  RENAL FUNCTION PANEL  HEMOGLOBIN  A1C    EKG EKG Interpretation  Date/Time:  Thursday October 22 2019 21:57:18 EST Ventricular Rate:  55 PR Interval:    QRS Duration: 241 QT Interval:  581 QTC Calculation: 556 R Axis:   -74 Text Interpretation: Atrial fibrillation Ventricular premature complex Left bundle branch block Confirmed by Quintella Reichert [phone]) on 10/22/2019 10:06:21 PM   Radiology Dg Chest Port 1 View  Result Date: 10/22/2019 CLINICAL DATA:  End-stage renal disease with hypertension. EXAM: PORTABLE CHEST 1 VIEW COMPARISON:  09/08/2019 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 size remains enlarged. Aortic calcifications are noted. The lung bases are only partially visualized, however there appears to be a right-sided pleural effusion with a possible right lower lobe airspace opacity. The lung apices are suboptimally evaluated secondary to patient positioning. There is no pneumothorax. The patient is status post prior TAVR. IMPRESSION: 1. Limited study as detailed above. 2. Probable right-sided pleural effusion. There is a right lower lung zone airspace opacity which may represent atelectasis or infiltrate. 3. Persistent cardiomegaly. Electronically Signed   By: Constance Holster M.D.   On: 10/22/2019 19:45   Dg Foot Complete Right  Result Date: 10/21/2019 CLINICAL DATA:  Right foot infection. EXAM: RIGHT FOOT COMPLETE - 3+ VIEW COMPARISON:  None. FINDINGS: Questionable osteolysis at the tip of second distal phalanx. No acute fracture or dislocation. Joint spaces are preserved. Mild dorsal midfoot degenerative spurring. Bone mineralization is normal. Plantar enthesophyte. No subcutaneous emphysema. Diffuse atherosclerotic vascular calcification. IMPRESSION: 1. Questionable osteolysis at the tip of the second distal phalanx, potentially reflecting osteomyelitis. Consider MRI for further evaluation. Electronically Signed   By: Titus Dubin M.D.   On: 10/21/2019 16:58     Procedures Procedures (including critical care time)  Medications Ordered in ED Medications  vancomycin variable dose per unstable renal function (pharmacist dosing) (has no administration in time range)  sodium chloride flush (NS) 0.9 % injection 3 mL (has no administration in time range)  acetaminophen (TYLENOL) tablet 650 mg (has no administration in time range)    Or  acetaminophen (TYLENOL) suppository 650 mg (has no administration in time range)  cefTRIAXone (ROCEPHIN) 2 g in sodium chloride 0.9 % 100 mL IVPB (has no administration in time range)  insulin detemir (LEVEMIR) injection 20 Units (has no administration in time range)  insulin aspart (novoLOG) injection 0-6 Units (has no administration in time range)  albuterol (PROVENTIL) (2.5 MG/3ML) 0.083% nebulizer solution 3 mL (has no administration in time range)  ALPRAZolam (XANAX) tablet 0.25 mg (has no administration in time range)  atorvastatin (LIPITOR) tablet 80 mg (has no administration in time range)  mometasone-formoterol (DULERA) 200-5 MCG/ACT inhaler 2 puff (has no administration in time range)  cinacalcet (SENSIPAR) tablet 30 mg (has no administration in time range)  midodrine (PROAMATINE) tablet 10 mg (has no administration in time range)  midodrine (PROAMATINE) tablet 2.5 mg (has no administration in time range)  vancomycin (VANCOCIN) IVPB 1000  mg/200 mL premix (0 mg Intravenous Stopped 10/22/19 2250)  cefTRIAXone (ROCEPHIN) 2 g in sodium chloride 0.9 % 100 mL IVPB (0 g Intravenous Stopped 10/22/19 2138)     Initial Impression / Assessment and Plan / ED Course  I have reviewed the triage vital signs and the nursing notes.  Pertinent labs & imaging results that were available during my care of the patient were reviewed by me and considered in my medical decision making (see chart for details).        Patient here for evaluation of redness and swelling to his right lower extremity. He has cellulitis on  examination with purulence and necrotic changes to the right third toe. He has a history of ESR D and chronic hypotension. He was treated with broad-spectrum antibiotics on ED evaluation. IV fluid resuscitation was withheld given his end-stage renal disease and baseline hypotension. On multiple rechecks during his ED stay he is mentating well and well perfused despite his hypotension. Hospitalist consulted for admission for further treatment.  Final Clinical Impressions(s) / ED Diagnoses   Final diagnoses:  Cellulitis of right lower extremity    ED Discharge Orders    None       Quintella Reichert, MD 10/23/19 0007

## 2019-10-22 NOTE — ED Notes (Signed)
Date and time results received: 10/22/19 2150 (use smartphrase ".now" to insert current time)  Test: Lactic Critical Value: 2.1  Name of Provider Notified: Dr. Ralene Bathe

## 2019-10-23 ENCOUNTER — Encounter (HOSPITAL_COMMUNITY): Payer: Self-pay | Admitting: General Practice

## 2019-10-23 DIAGNOSIS — M869 Osteomyelitis, unspecified: Secondary | ICD-10-CM

## 2019-10-23 DIAGNOSIS — Z992 Dependence on renal dialysis: Secondary | ICD-10-CM

## 2019-10-23 DIAGNOSIS — L89150 Pressure ulcer of sacral region, unstageable: Secondary | ICD-10-CM

## 2019-10-23 DIAGNOSIS — I96 Gangrene, not elsewhere classified: Secondary | ICD-10-CM

## 2019-10-23 DIAGNOSIS — Z950 Presence of cardiac pacemaker: Secondary | ICD-10-CM

## 2019-10-23 DIAGNOSIS — I5042 Chronic combined systolic (congestive) and diastolic (congestive) heart failure: Secondary | ICD-10-CM

## 2019-10-23 DIAGNOSIS — N186 End stage renal disease: Secondary | ICD-10-CM

## 2019-10-23 LAB — RENAL FUNCTION PANEL
Albumin: 1.8 g/dL — ABNORMAL LOW (ref 3.5–5.0)
Anion gap: 12 (ref 5–15)
BUN: 29 mg/dL — ABNORMAL HIGH (ref 8–23)
CO2: 27 mmol/L (ref 22–32)
Calcium: 7.7 mg/dL — ABNORMAL LOW (ref 8.9–10.3)
Chloride: 95 mmol/L — ABNORMAL LOW (ref 98–111)
Creatinine, Ser: 4.18 mg/dL — ABNORMAL HIGH (ref 0.61–1.24)
GFR calc Af Amer: 15 mL/min — ABNORMAL LOW (ref >60)
GFR calc non Af Amer: 13 mL/min — ABNORMAL LOW (ref >60)
Glucose, Bld: 149 mg/dL — ABNORMAL HIGH (ref 70–99)
Phosphorus: 4.3 mg/dL (ref 2.5–4.6)
Potassium: 4 mmol/L (ref 3.5–5.1)
Sodium: 134 mmol/L — ABNORMAL LOW (ref 135–145)

## 2019-10-23 LAB — SARS CORONAVIRUS 2 (TAT 6-24 HRS): SARS Coronavirus 2: NEGATIVE

## 2019-10-23 LAB — GLUCOSE, CAPILLARY
Glucose-Capillary: 136 mg/dL — ABNORMAL HIGH (ref 70–99)
Glucose-Capillary: 149 mg/dL — ABNORMAL HIGH (ref 70–99)

## 2019-10-23 LAB — CBC
HCT: 31.6 % — ABNORMAL LOW (ref 39.0–52.0)
Hemoglobin: 10.4 g/dL — ABNORMAL LOW (ref 13.0–17.0)
MCH: 29.9 pg (ref 26.0–34.0)
MCHC: 32.9 g/dL (ref 30.0–36.0)
MCV: 90.8 fL (ref 80.0–100.0)
Platelets: 150 10*3/uL (ref 150–400)
RBC: 3.48 MIL/uL — ABNORMAL LOW (ref 4.22–5.81)
RDW: 18.1 % — ABNORMAL HIGH (ref 11.5–15.5)
WBC: 12.8 10*3/uL — ABNORMAL HIGH (ref 4.0–10.5)
nRBC: 0 % (ref 0.0–0.2)

## 2019-10-23 LAB — APTT
aPTT: >200 seconds (ref 24–36)
aPTT: 81 seconds — ABNORMAL HIGH (ref 24–36)

## 2019-10-23 LAB — HEPARIN LEVEL (UNFRACTIONATED): Heparin Unfractionated: >2.2 IU/mL — ABNORMAL HIGH (ref 0.30–0.70)

## 2019-10-23 MED ORDER — HEPARIN (PORCINE) 25000 UT/250ML-% IV SOLN
750.0000 [IU]/h | INTRAVENOUS | Status: DC
  Administered 2019-10-23: 1400 [IU]/h via INTRAVENOUS
  Administered 2019-10-24: 1250 [IU]/h via INTRAVENOUS
  Administered 2019-10-25: 700 [IU]/h via INTRAVENOUS
  Filled 2019-10-23 (×3): qty 250

## 2019-10-23 MED ORDER — CHLORHEXIDINE GLUCONATE CLOTH 2 % EX PADS
6.0000 | MEDICATED_PAD | Freq: Every day | CUTANEOUS | Status: DC
  Administered 2019-10-24 – 2019-10-25 (×2): 6 via TOPICAL

## 2019-10-23 MED ORDER — HYDROMORPHONE HCL 1 MG/ML IJ SOLN
0.5000 mg | INTRAMUSCULAR | Status: DC | PRN
  Administered 2019-10-23 – 2019-10-27 (×8): 0.5 mg via INTRAVENOUS
  Filled 2019-10-23 (×7): qty 0.5

## 2019-10-23 MED ORDER — SEVELAMER CARBONATE 800 MG PO TABS
3200.0000 mg | ORAL_TABLET | Freq: Three times a day (TID) | ORAL | Status: DC
  Administered 2019-10-23 – 2019-10-30 (×13): 3200 mg via ORAL
  Filled 2019-10-23 (×16): qty 4

## 2019-10-23 MED ORDER — DOXERCALCIFEROL 4 MCG/2ML IV SOLN
1.0000 ug | INTRAVENOUS | Status: DC
  Filled 2019-10-23: qty 2

## 2019-10-23 MED ORDER — MIDODRINE HCL 5 MG PO TABS
5.0000 mg | ORAL_TABLET | ORAL | Status: DC
  Administered 2019-10-25 – 2019-10-30 (×8): 5 mg via ORAL
  Filled 2019-10-23 (×8): qty 1

## 2019-10-23 NOTE — Progress Notes (Signed)
MEWS/VS Documentation      10/23/2019 0446 10/23/2019 0700 10/23/2019 0825 10/23/2019 0924   MEWS Score:  2  2  2  2    MEWS Score Color:  Yellow  Yellow  Yellow  Yellow   Resp:  -  -  18  16   Pulse:  64  -  65  (!) 55   BP:  -  -  -  (!) 72/38   Temp:  -  -  -  98.2 F (36.8 C)   O2 Device:  Nasal Cannula  -  Nasal Cannula  Nasal Cannula   O2 Flow Rate (L/min):  4 L/min  -  4 L/min  -     No acute change. Patient has chronic low BP and is asymptomatic. Doctors are aware.

## 2019-10-23 NOTE — Progress Notes (Signed)
ANTICOAGULATION CONSULT NOTE - Initial Consult  Pharmacy Consult for heparin Indication: atrial fibrillation  Allergies  Allergen Reactions  . Byetta 10 Mcg Pen [Exenatide] Diarrhea and Nausea And Vomiting  . Telmisartan Other (See Comments)    Unknown reaction - reported by [facility]  . Codeine Itching    Minor reaction  . Coumadin [Warfarin Sodium] Rash    (wife states coumadin was stopped but rash did not disappear 02/21/19)    Patient Measurements: Height: 5\' 11"  (180.3 cm) Weight: 236 lb (107 kg) IBW/kg (Calculated) : 75.3 Heparin Dosing Weight: 100kg  Vital Signs: Temp: 98.1 F (36.7 C) (11/27 0445) Temp Source: Oral (11/27 0445) BP: 76/48 (11/27 0445) Pulse Rate: 64 (11/27 0446)  Labs: Recent Labs    10/21/19 1543 10/22/19 1931 10/23/19 0347  HGB 11.7* 10.6* 10.4*  HCT 36.5* 32.1* 31.6*  PLT 182 191 150  APTT  --  53*  --   LABPROT 25.2* 32.3*  --   INR 2.3* 3.2*  --   CREATININE 4.19* 4.09* 4.18*    Estimated Creatinine Clearance: 17.5 mL/min (A) (by C-G formula based on SCr of 4.18 mg/dL (H)).   Medical History: Past Medical History:  Diagnosis Date  . Anginal pain ([place])   . Aortic stenosis    a. severe by echo 09/2014  . Atrial fibrillation ([place])    a. not well documented, not on anticoagulation  . CHF (congestive heart failure) ([place])    04/28/17 echo-EF 40%, mod LVH, diastolic dysfunction  . Claustrophobia   . Complete heart block ([place])   . COPD (chronic obstructive pulmonary disease) ([place])   . Coronary artery disease    a. chronically occluded RCA per cath 09/2014 with collaterals B. cath 05/01/17 chr occ RCA w/collaterals, 60-70% mid LAD,   . CVA (cerebral vascular accident) ([place]) 10/2014   denies residual on 07/11/2015  . ESRD (end stage renal disease) on dialysis [facility])    a. on dialysis; [facility]; MWF, LUE fistula (07/11/2015)  . History of blood transfusion    "related to gallbladder OR"  . History of stomach ulcers   .  Hyperlipidemia   . Hypertension   . Iron deficiency anemia   . Myocardial infarction ([place]) 10/2014  . Peripheral vascular disease ([place])   . Pneumonia   . Presence of permanent cardiac pacemaker   . 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 Type II diabetes mellitus (HCC)     Medications:  Medications Prior to Admission  Medication Sig Dispense Refill Last Dose  . acetaminophen (TYLENOL) 500 MG tablet Take 1 tablet (500 mg total) by mouth every 6 (six) hours as needed for mild pain or headache (pain). (Patient taking differently: Take 1,000 mg by mouth every 6 (six) hours as needed for headache (pain). )   few days ago  . albuterol (PROVENTIL HFA;VENTOLIN HFA) 108 (90 Base) MCG/ACT inhaler Inhale 2 puffs into the lungs every 4 (four) hours as needed for wheezing or shortness of breath. 1 Inhaler 11 few weeks ago  . albuterol (PROVENTIL) (2.5 MG/3ML) 0.083% nebulizer solution Take 3 mLs (2.5 mg total) by nebulization every 6 (six) hours as needed for wheezing or shortness of breath. 150 mL 1 few days ago  . ALPRAZolam (XANAX) 0.25 MG tablet Take 1 tablet (0.25 mg total) by mouth 2 (two) times daily as needed for anxiety. (Patient taking differently: Take 0.25 mg  by mouth 2 (two) times daily as needed (severe anxiety/panic attacks). ) 15 tablet 0 few weeks ago  . apixaban (ELIQUIS) 2.5 MG TABS tablet Take 1 tablet (2.5 mg total) by mouth 2 (two) times daily. 180 tablet 3 10/22/2019 at 1800  . atorvastatin (LIPITOR) 80 MG tablet Take 1 tablet (80 mg total) by mouth daily at 6 PM. 90 tablet 3 10/22/2019 at pm  . budesonide-formoterol (SYMBICORT) 160-4.5 MCG/ACT inhaler Inhale 2 puffs into the lungs 2 (two) times daily as needed (shortness of breath).    unknown  . cinacalcet (SENSIPAR) 30 MG tablet Take 30 mg by mouth See admin instructions. Take one tablet (30 mg) by mouth on Monday,  Tuesday, Thursday, Saturday after dialysis   10/22/2019 at pm  . clopidogrel (PLAVIX) 75 MG tablet Take 1 tablet (75 mg total) by mouth daily. 90 tablet 3 10/22/2019 at am  . LEVEMIR FLEXTOUCH 100 UNIT/ML Pen INJECT 48 UNITS TOTAL INTO THE SKIN AT BEDTIME. (Patient taking differently: Inject 20 Units into the skin at bedtime. ) 15 mL 0 10/21/2019 at pm  . lidocaine-prilocaine (EMLA) cream Apply 1 application topically See admin instructions. Apply to port access prior to dialysis on Monday, Tuesday, Thursday, Saturday   10/22/2019 at am  . loperamide (IMODIUM) 2 MG capsule Take 2 mg by mouth 4 (four) times daily as needed for diarrhea or loose stools.    10/22/2019 at Unknown time  . loratadine (CLARITIN) 10 MG tablet Take 10 mg by mouth daily.    10/22/2019 at am  . metoprolol tartrate (LOPRESSOR) 25 MG tablet Take 0.5 tablets (12.5 mg total) by mouth See admin instructions. Take 1/2 tablet (12.5 mg) by mouth on Sunday, Monday, Wednesday, Friday mornings (non-dialysis days), take 1/2 tablet (12.5 mg) every evening (Patient taking differently: Take 12.5 mg by mouth See admin instructions. Take 1/2 tablet (12.5 mg) by mouth on Sunday, Wednesday, Friday mornings (non-dialysis days), take 1/2 tablet (12.5 mg) every evening) 90 tablet 3 10/22/2019 at 1800  . midodrine (PROAMATINE) 10 MG tablet Take 10 mg by mouth See admin instructions. Take one tablet (10 mg) by mouth 30 minutes before dialysis on Monday, Tuesday, Thursday, Saturday   10/22/2019 at am  . midodrine (PROAMATINE) 2.5 MG tablet Take 1 tablet (2.5 mg total) by mouth 3 (three) times daily with meals. Patient should continue to take 10 mg in the morning prior to hemodialysis sessions. (Patient taking differently: Take 2.5 mg by mouth See admin instructions. Take one tablet (2.5 mg) by mouth three times daily on Sunday, Wednesday, Friday (non-dialysis days), take one tablet (2.5 mg) after dialysis on Monday, Tuesday, Thursday, Saturday (continue to  take 10 mg on Monday, Tuesday, Thursday, Saturday before dialysis)) 90 tablet 0 10/22/2019 at pm  . nitroGLYCERIN (NITROSTAT) 0.4 MG SL tablet Place 1 tablet (0.4 mg total) under the tongue every 5 (five) minutes as needed for chest pain. 30 tablet 12 10/20/2019  . OXYGEN Inhale 3 L into the lungs continuous.    10/22/2019 at Unknown time  . pantoprazole (PROTONIX) 40 MG tablet Take 1 tablet (40 mg total) by mouth daily. 90 tablet 3 10/22/2019 at am  . sevelamer carbonate (RENVELA) 800 MG tablet Take 2,400 mg by mouth See admin instructions. Take three tablets (2400 mg) by mouth once or twice daily with meals   10/20/2019  . doxercalciferol (HECTOROL) 4 MCG/2ML injection Inject 0.5 mLs (1 mcg total) into the vein Every Tuesday,Thursday,and Saturday with dialysis.   unknown  . Insulin  Pen Needle 31G X 6 MM MISC at bedtime.       Scheduled:  . atorvastatin  80 mg Oral q1800  . [START ON 10/24/2019] cinacalcet  30 mg Oral Once per day on Mon Tue Thu Sat  . insulin aspart  0-6 Units Subcutaneous TID WC  . insulin detemir  20 Units Subcutaneous QHS  . [START ON 10/24/2019] midodrine  10 mg Oral Once per day on Mon Tue Thu Sat  . midodrine  2.5 mg Oral 3 times per day on Sun Wed Fri  . [START ON 10/24/2019] midodrine  2.5 mg Oral Once per day on Mon Tue Thu Sat  . mometasone-formoterol  2 puff Inhalation BID  . sodium chloride flush  3 mL Intravenous Q12H  . vancomycin variable dose per unstable renal function (pharmacist dosing)   Does not apply See admin instructions   Infusions:  . cefTRIAXone (ROCEPHIN)  IV      Assessment: 78yo male admitted w/ cellulitis and osteomyelitis of right foot, on Eliquis PTA for Afib (last dose 11/26 1800) > Eliquis on hold for possible surgical intervention, to begin heparin bridge.  Goal of Therapy:  Heparin level 0.3-0.7 units/ml aPTT 66-102 seconds Monitor platelets by anticoagulation protocol: Yes   Plan:  Will begin heparin gtt at 1400 units/hr (was  previously therapeutic at this rate) and monitor heparin levels, aPTT (while anti-Xa affected by Eliquis), and CBC.  Wynona Neat, PharmD, BCPS  10/23/2019,7:31 AM

## 2019-10-23 NOTE — Progress Notes (Addendum)
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  Initially given Vanco/cefepime in ED but declined admission to go home for Thanksgiving, went for HD on Thursday and return to ED on 11/26 for further management.  Nephrology consulted for HD and vascular surgery consulted.  A & P   Principal Problem:   Osteomyelitis of right foot (HCC) Active Problems:   ESRD (end stage renal disease) on dialysis [facility])   Atherosclerosis of native coronary artery of native heart with angina pectoris (HCC)   Chronic combined systolic and diastolic CHF (congestive heart failure) (HCC)   Hyperlipidemia   COPD (chronic obstructive pulmonary disease) (HCC)   Chronic respiratory failure with hypoxia (HCC)   Chronic hypotension   Complete heart block (HCC)   Presence of permanent cardiac pacemaker   PVD (peripheral vascular disease) (HCC)   Unstageable pressure ulcer of sacral region ([place])   Suspected osteomyelitis of the second distal phalanx of right foot with overlying cellulitis Follows with Dr. Scot Dock, VVS-vascular surgery No MRI as patient has PPM Leukocytosis (12.8) -Continue vancomycin/ceftriaxone -Vascular surgery consulted: Recommending  amputation on Monday however patient is currently hoping to keep it is leg.  Will readdress with patient in a.m. -Follow-up cultures -Dilaudid 0.5 mg every 4 hours as needed for severe pain in dialysis patient  ESRD on HD (MTThS) Creatinine 4.18 -Neurology on board: HD tomorrow per regular schedule  Chronic hypotension with volume overload SBP in 70s despite low-dose midodrine today -HD tomorrow -Increase midodrine 5 mg 3 times daily Sunday Wednesday Friday -Consider Florinef  Atrial fibrillation with bradycardia Complete heart block status post PPM CHA2DS2-VASc: 7, chronically on Eliquis TSH borderline elevated, possible subclinical hypothyroidism -Hold Eliquis -Heparin bridge to procedure, pharmacy to manage -Telemetry -Repeat TSH  COPD with chronic hypoxic respiratory failure on 3 L nasal cannula at baseline On 4 L nasal cannula currently -Titrate oxygen as needed -Continue nebulizers  Sacral Pressure ulcer noted by nursing on presentation -Wound care  Diabetes HA1C 6.8 -Continue Levemir 20 units nightly and NovoLog sliding scale  CAD status post PCI Stable -Hold Lopressor and Plavix for now  Hyperlipidemia Stable -Continue atorvastatin  Anxiety -Continue as needed Xanax  DVT prophylaxis: Heparin drip Diet: Renal, carb modified, fluid restriction Family Communication: No family at bedside Disposition Plan: Pending clinical stability  Consultants  Vascular surgery Nephrology  Procedures  None  Antibiotics   Anti-infectives (From admission, onward)   Start     Dose/Rate Route Frequency Ordered Stop   10/23/19 1800  cefTRIAXone (ROCEPHIN) 2 g in sodium chloride 0.9 % 100 mL IVPB     2 g 200 mL/hr over 30 Minutes Intravenous Every 24 hours 10/22/19 2323     11 /27/20 0000  vancomycin  variable dose per unstable renal function (pharmacist dosing)      Does not apply See admin instructions 10/22/19 1958     10/22/19 1945  vancomycin (VANCOCIN) IVPB 1000  mg/200 mL premix     1,000 mg 200 mL/hr over 60 Minutes Intravenous  Once 10/22/19 1931 10/22/19 2250   10/22/19 1945  cefTRIAXone (ROCEPHIN) 2 g in sodium chloride 0.9 % 100 mL IVPB     2 g 200 mL/hr over 30 Minutes Intravenous  Once 10/22/19 1931 10/22/19 2138           Subjective   Patient seen and examined at bedside no acute distress resting comfortably.  States that he has been having worsening pain in his right foot over the past several days.  States his wife noted some drainage over the past day or so.  Wishes to keep this leg and to try and avoid amputation.  Complaining of pain in his right lower extremity which is intermittent  States he has been very inactive at home recently since his recent amputation and uses a hoyer lift at home with the help of his son and wife whom he lives with.   Objective   Vitals:   10/23/19 0446 10/23/19 0825 10/23/19 0924 10/23/19 1231  BP:   (!) 72/38 (!) 74/47  Pulse: 64 65 (!) 55 (!) 54  Resp:  18 16 18   Temp:   98.2 F (36.8 C) (!) 97.4 F (36.3 C)  TempSrc:   Oral Oral  SpO2: 100% 100% 98% 95%  Weight:      Height:        Intake/Output Summary (Last 24 hours) at 10/23/2019 1654 Last data filed at 10/23/2019 1543 Gross per 24 hour  Intake 480.41 ml  Output --  Net 480.41 ml   Filed Weights   10/22/19 2050  Weight: 107 kg    Examination:  Physical Exam Vitals signs and nursing note reviewed.  Constitutional:      Appearance: He is obese.     Comments: Appears chronically ill  HENT:     Head: Normocephalic.     Nose:     Comments: Nasal cannula Eyes:     Extraocular Movements: Extraocular movements intact.  Neck:     Musculoskeletal: No neck rigidity.  Cardiovascular:     Rate and Rhythm: Bradycardia present.     Comments: Paced on telemetry Pulmonary:     Effort: Pulmonary effort is normal.     Breath sounds: Normal breath sounds.  Abdominal:     General: Abdomen is flat.     Palpations: Abdomen is  soft.  Musculoskeletal:     Comments: Status post left BKA Right lower extremity erythematous with gangrenous changes of right second digit  Neurological:     Mental Status: Mental status is at baseline.  Psychiatric:        Mood and Affect: Mood normal.        Behavior: Behavior normal.     Data Reviewed: I have personally reviewed following labs and imaging studies  CBC: Recent Labs  Lab 10/21/19 1543 10/22/19 1931 10/23/19 0347  WBC 10.9* 12.5* 12.8*  NEUTROABS 8.4* 9.8*  --   HGB 11.7* 10.6* 10.4*  HCT 36.5* 32.1* 31.6*  MCV 94.8 91.7 90.8  PLT 182 191 [id number]   Basic Metabolic Panel: Recent Labs  Lab 10/21/19 1543 10/22/19 1931 10/23/19 0347  NA 133* 132* 134*  K 4.3 4.1 4.0  CL 92* 94* 95*  CO2 27  27 27  GLUCOSE 127* 150* 149*  BUN 25* 27* 29*  CREATININE 4.19* 4.09* 4.18*  CALCIUM 8.0* 7.9* 7.7*  PHOS  --   --  4.3   GFR: Estimated Creatinine Clearance: 17.5 mL/min (A) (by C-G formula based on SCr of 4.18 mg/dL (H)). Liver Function Tests: Recent Labs  Lab 10/21/19 1543 10/22/19 1931 10/23/19 0347  AST 35 39  --   ALT 15 17  --   ALKPHOS 146* 126  --   BILITOT 2.9* 2.3*  --   PROT 6.5 5.4*  --   ALBUMIN 2.4* 1.9* 1.8*   No results for input(s): LIPASE, AMYLASE in the last 168 hours. No results for input(s): AMMONIA in the last 168 hours. Coagulation Profile: Recent Labs  Lab 10/21/19 1543 10/22/19 1931  INR 2.3* 3.2*   Cardiac Enzymes: No results for input(s): CKTOTAL, CKMB, CKMBINDEX, TROPONINI in the last 168 hours. BNP (last 3 results) No results for input(s): PROBNP in the last 8760 hours. HbA1C: Recent Labs    10/22/19 1931  HGBA1C 6.8*   CBG: Recent Labs  Lab 10/23/19 0029 10/23/19 0755  GLUCAP 149* 136*   Lipid Profile: No results for input(s): CHOL, HDL, LDLCALC, TRIG, CHOLHDL, LDLDIRECT in the last 72 hours. Thyroid Function Tests: No results for input(s): TSH, T4TOTAL, FREET4, T3FREE, THYROIDAB in the last 72  hours. Anemia Panel: No results for input(s): VITAMINB12, FOLATE, FERRITIN, TIBC, IRON, RETICCTPCT in the last 72 hours. Sepsis Labs: Recent Labs  Lab 10/21/19 1544 10/22/19 1931 10/22/19 2158  LATICACIDVEN 1.8 2.1* 1.0    Recent Results (from the past 240 hour(s))  Culture, blood (Routine x 2)     Status: None (Preliminary result)   Collection Time: 10/21/19  4:08 PM   Specimen: BLOOD RIGHT ARM  Result Value Ref Range Status   Specimen Description BLOOD RIGHT ARM UPPER  Final   Special Requests   Final    BOTTLES DRAWN AEROBIC ONLY Blood Culture adequate volume   Culture   Final    NO GROWTH 2 DAYS Performed at [facility], [place] [address] [place], [place] [address]    Report Status PENDING  Incomplete  Blood Culture (routine x 2)     Status: None (Preliminary result)   Collection Time: 10/22/19  8:36 PM   Specimen: BLOOD RIGHT WRIST  Result Value Ref Range Status   Specimen Description BLOOD RIGHT WRIST  Final   Special Requests   Final    BOTTLES DRAWN AEROBIC AND ANAEROBIC Blood Culture adequate volume   Culture   Final    NO GROWTH < 24 HOURS Performed at [facility], [place] [address], [place], [place] [address]    Report Status PENDING  Incomplete  Culture, blood (Routine x 2)     Status: None (Preliminary result)   Collection Time: 10/22/19  9:58 PM   Specimen: BLOOD RIGHT HAND  Result Value Ref Range Status   Specimen Description BLOOD RIGHT HAND POST VANC/ROCEPHIN  Final   Special Requests   Final    BOTTLES DRAWN AEROBIC AND ANAEROBIC Blood Culture adequate volume   Culture   Final    NO GROWTH < 24 HOURS Performed at [facility], [place] [address] [place], [place] [address]    Report Status PENDING  Incomplete  SARS CORONAVIRUS 2 (TAT 6-24 HRS) Nasopharyngeal Nasopharyngeal Swab     Status: None   Collection Time: 10/22/19 10:55 PM   Specimen: Nasopharyngeal Swab  Result  Value Ref Range Status   SARS Coronavirus 2 NEGATIVE NEGATIVE  Final    Comment: (NOTE) SARS-CoV-2 target nucleic acids are NOT DETECTED. The SARS-CoV-2 RNA is generally detectable in upper and lower respiratory specimens during the acute phase of infection. Negative results do not preclude SARS-CoV-2 infection, do not rule out co-infections with other pathogens, and should not be used as the sole basis for treatment or other patient management decisions. Negative results must be combined with clinical observations, patient history, and epidemiological information. The expected result is Negative. Fact Sheet for Patients: SugarRoll.be Fact Sheet for Healthcare Providers: https://www.woods-mathews.com/ This test is not yet approved or cleared by the [place] FDA and  has been authorized for detection and/or diagnosis of SARS-CoV-2 by FDA under an Emergency Use Authorization (EUA). This EUA will remain  in effect (meaning this test can be used) for the duration of the COVID-19 declaration under Section 56 4(b)(1) of the Act, 21 U.S.C. section 360bbb-3(b)(1), unless the authorization is terminated or revoked sooner. Performed at [facility], [place] [address] [place], [place] [address]          Radiology Studies: Dg Chest Port 1 View  Result Date: 10/22/2019 CLINICAL DATA:  End-stage renal disease with hypertension. EXAM: PORTABLE CHEST 1 VIEW COMPARISON:  09/08/2019 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 size remains enlarged. Aortic calcifications are noted. The lung bases are only partially visualized, however there appears to be a right-sided pleural effusion with a possible right lower lobe airspace opacity. The lung apices are suboptimally evaluated secondary to patient positioning. There is no pneumothorax. The patient is status post prior TAVR. IMPRESSION: 1. Limited study as detailed above. 2. Probable right-sided pleural effusion. There is a right  lower lung zone airspace opacity which may represent atelectasis or infiltrate. 3. Persistent cardiomegaly. Electronically Signed   By: Constance Holster M.D.   On: 10/22/2019 19:45        Scheduled Meds:  atorvastatin  80 mg Oral q1800   [START ON 10/24/2019] Chlorhexidine Gluconate Cloth  6 each Topical Q0600   [START ON 10/24/2019] cinacalcet  30 mg Oral Once per day on Mon Tue Thu Sat   [START ON 10/24/2019] doxercalciferol  1 mcg Intravenous Q T,Th,Sa-HD   insulin aspart  0-6 Units Subcutaneous TID WC   insulin detemir  20 Units Subcutaneous QHS   [START ON 10/24/2019] midodrine  10 mg Oral Once per day on Mon Tue Thu Sat   midodrine  2.5 mg Oral 3 times per day on Sun Wed Fri   [START ON 10/24/2019] midodrine  2.5 mg Oral Once per day on Mon Tue Thu Sat   mometasone-formoterol  2 puff Inhalation BID   sevelamer carbonate  3,200 mg Oral TID WC   sodium chloride flush  3 mL Intravenous Q12H   vancomycin variable dose per unstable renal function (pharmacist dosing)   Does not apply See admin instructions   Continuous Infusions:  cefTRIAXone (ROCEPHIN)  IV     heparin 1,400 Units/hr (10/23/19 0816)     LOS: 1 day    Time spent: 30 minutes with over 50% of the time coordinating the patient's care    Harold Hedge, DO [facility] Pager [phone]  If 7PM-7AM, please contact night-coverage [email or web address] Password [facility] [place] 10/23/2019, 4:54 PM

## 2019-10-23 NOTE — Progress Notes (Signed)
ANTICOAGULATION CONSULT NOTE - Follow Up Consult  Pharmacy Consult for Heparin Indication: atrial fibrillation  Allergies  Allergen Reactions  . Byetta 10 Mcg Pen [Exenatide] Diarrhea and Nausea And Vomiting  . Telmisartan Other (See Comments)    Unknown reaction - reported by [facility]  . Codeine Itching    Minor reaction  . Coumadin [Warfarin Sodium] Rash    (wife states coumadin was stopped but rash did not disappear 02/21/19)    Patient Measurements: Height: 5\' 11"  (180.3 cm) Weight: 236 lb (107 kg) IBW/kg (Calculated) : 75.3 Heparin Dosing Weight:    Vital Signs: Temp: 97.3 F (36.3 C) (11/27 1730) Temp Source: Axillary (11/27 1730) BP: 86/51 (11/27 1730) Pulse Rate: 54 (11/27 1231)  Labs: Recent Labs    10/21/19 1543 10/22/19 1931 10/23/19 0347 10/23/19 1553 10/23/19 1846  HGB 11.7* 10.6* 10.4*  --   --   HCT 36.5* 32.1* 31.6*  --   --   PLT 182 191 150  --   --   APTT  --  53*  --  >200* 81*  LABPROT 25.2* 32.3*  --   --   --   INR 2.3* 3.2*  --   --   --   HEPARINUNFRC  --   --   --  >2.20*  --   CREATININE 4.19* 4.09* 4.18*  --   --     Estimated Creatinine Clearance: 17.5 mL/min (A) (by C-G formula based on SCr of 4.18 mg/dL (H)).   Assessment:  Anticoag: Eliquis PTA for Afib (LD 11/26) held awaiting possible surgery. Heparin drip to bridge.   -  ([id number]) RN noted aptt >200, MD ordered infusion to be put on hold. This was a lab draw error. - (1846) aPTT 81 after being off 1.5 hrs.  Goal of Therapy:  aPTT 66-102 seconds Monitor platelets by anticoagulation protocol: Yes   Plan:  Decrease previous IV heparin infusion rate to 1250 units/hr Recheck aPTT in AM. Daily HL, aPTT, and CBC   Jaya Lapka S. Alford Highland, PharmD, BCPS Clinical Staff Pharmacist  Eilene Ghazi [place]illing[place] 10/23/2019,7:38 PM

## 2019-10-23 NOTE — Consult Note (Signed)
[facility] Renal Consultation Note    Indication for Consultation:  Management of ESRD/hemodialysis, anemia, hypertension/volume, and secondary hyperparathyroidism  HPI: Jeffrey [name] is a 78 y.o. male with a history of ESRD on dialysis, CAD, complete heart block with a pacemaker, a. Fib and s/p TAVR, who presented to the ED on the evening on 11/25 with wound infection of his R foot. He was referred to the ED by his wound car clinic for worsening pain and redness of the leg. Admission was recommended for osteomyelitis but patient decided to go home for Thanksgiving. He was given one dose of vancomycin and cefepime and discharged on oral antibiotics. He presented to HD the next morning and ran an abbreviated treatment of about 2 hours. He then returned to the ED on 11/26 after Thanksgiving dinner for further treatment. At that time, BP 80/52, pulse 54, SpO2 100%, K+ 4.0, BUN 29, Cr 4.18, Ca 7.7, alb 1.8, WBC 12.8, and Hgb 10.4. He is on O2 3L at baseline. He was started on IV vancomycin and ceftriaxone. Vascular was consulted. Patient reports he strongly prefers to try conservative treatment first and not amputate.  Patient dialyzes Monday, Tuesday, Thursday, Saturday. Reports 4x week dialysis is due to hypovolemia. He has chronic low blood pressure which limits UF. He  Reports shortness of breath is about at baseline, but he has had slightly worse dyspnea with exertion over the past week. Reports he always has shortness of breath when laying flat. He denies CP, palpitations, abdominal pain, N/V/D, fever, chills and dizziness.   Past Medical History:  Diagnosis Date  . Anginal pain ([place])   . Aortic stenosis    a. severe by echo 09/2014  . Atrial fibrillation ([place])    a. not well documented, not on anticoagulation  . CHF (congestive heart failure) ([place])    04/28/17 echo-EF 40%, mod LVH, diastolic dysfunction  . Claustrophobia   . Complete heart block ([place])   . COPD (chronic  obstructive pulmonary disease) ([place])   . Coronary artery disease    a. chronically occluded RCA per cath 09/2014 with collaterals B. cath 05/01/17 chr occ RCA w/collaterals, 60-70% mid LAD,   . CVA (cerebral vascular accident) ([place]) 10/2014   denies residual on 07/11/2015  . ESRD (end stage renal disease) on dialysis [facility])    a. on dialysis; [place]; MWF, LUE fistula (07/11/2015)  . History of blood transfusion    "related to gallbladder OR"  . History of stomach ulcers   . Hyperlipidemia   . Hypertension   . Iron deficiency anemia   . Myocardial infarction ([place]) 10/2014  . Peripheral vascular disease ([place])   . Pneumonia   . Presence of permanent cardiac pacemaker   . 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 Type II diabetes mellitus ([place])    Past Surgical History:  Procedure Laterality Date  . ABDOMINAL AORTOGRAM W/LOWER EXTREMITY Bilateral 03/27/2019   Procedure: ABDOMINAL AORTOGRAM W/LOWER EXTREMITY;  Surgeon: Angelia Mould, MD;  Location: [place] CV LAB;  Service: Cardiovascular;  Laterality: Bilateral;  . AMPUTATION Left 04/14/2019   Procedure: AMPUTATION BELOW KNEE;  Surgeon: Angelia Mould, MD;  Location: [place];  Service: Vascular;  Laterality: Left;  . AV FISTULA PLACEMENT Left 10/19/2014   Procedure: BRACHIOCEPHALIC ARTERIOVENOUS (AV) FISTULA CREATION ;  Surgeon: Conrad Glen Jean, MD;  Location: [place];  Service: Vascular;  Laterality: Left;  . CARDIAC CATHETERIZATION    . CARDIAC CATHETERIZATION N/A 07/22/2015   Procedure: Right/Left Heart Cath and Coronary Angiography;  Surgeon: [name], MD;  Location: [place] CV LAB;  Service: Cardiovascular;  Laterality: N/A;  . CATARACT EXTRACTION W/ INTRAOCULAR LENS  IMPLANT, BILATERAL Bilateral 1990's  . CHOLECYSTECTOMY OPEN  1980's  . COLONOSCOPY W/ BIOPSIES AND POLYPECTOMY    .  CORONARY ANGIOGRAPHY N/A 07/31/2018   Procedure: CORONARY ANGIOGRAPHY;  Surgeon: Martinique, Peter M, MD;  Location: [place] CV LAB;  Service: Cardiovascular;  Laterality: N/A;  . CORONARY ANGIOPLASTY    . CORONARY ATHERECTOMY N/A 04/18/2018   Procedure: CORONARY ATHERECTOMY;  Surgeon: [name], MD;  Location: [place] CV LAB;  Service: Cardiovascular;  Laterality: N/A;  . CORONARY BALLOON ANGIOPLASTY N/A 07/31/2018   Procedure: CORONARY BALLOON ANGIOPLASTY;  Surgeon: Martinique, Peter M, MD;  Location: [place] CV LAB;  Service: Cardiovascular;  Laterality: N/A;  . CORONARY STENT INTERVENTION N/A 04/18/2018   Procedure: CORONARY STENT INTERVENTION;  Surgeon: [name], MD;  Location: [place] CV LAB;  Service: Cardiovascular;  Laterality: N/A;  . EP IMPLANTABLE DEVICE N/A 07/11/2015   Procedure: Pacemaker Implant;  Surgeon: [name], MD;  Location: [place] [facility];  Service: Cardiovascular;  Laterality: N/A;  . ESOPHAGOGASTRODUODENOSCOPY  08/01/2012   Procedure: ESOPHAGOGASTRODUODENOSCOPY (EGD);  Surgeon: [name], MD;  Location: [facility];  Service: Endoscopy;  Laterality: N/A;  . INSERT / REPLACE / REMOVE PACEMAKER  07/11/2015  . INSERTION OF DIALYSIS CATHETER Right 02/02/2015   Procedure: INSERTION OF DIALYSIS CATHETER  RIGHT INTERNAL JUGULAR;  Surgeon: Mal [name], MD;  Location: [place];  Service: Vascular;  Laterality: Right;  . LEFT AND RIGHT HEART CATHETERIZATION WITH CORONARY ANGIOGRAM N/A 09/30/2014   Procedure: LEFT AND RIGHT HEART CATHETERIZATION WITH CORONARY ANGIOGRAM;  Surgeon: [name], MD;  Location: [facility] C[facility];  Service: Cardiovascular;  Laterality: N/A;  . LEFT HEART CATH AND CORONARY ANGIOGRAPHY N/A 04/14/2018   Procedure: LEFT HEART CATH AND CORONARY ANGIOGRAPHY;  Surgeon: [name], MD;  Location: [place] CV LAB;  Service: Cardiovascular;  Laterality: N/A;  . TEE WITHOUT CARDIOVERSION N/A 08/02/2015   Procedure:  TRANSESOPHAGEAL ECHOCARDIOGRAM (TEE);  Surgeon: [name], MD;  Location: [place];  Service: Open Heart Surgery;  Laterality: N/A;  . TONSILLECTOMY    . TRANSCATHETER AORTIC VALVE REPLACEMENT, TRANSFEMORAL Left 08/02/2015   Procedure: TRANSCATHETER AORTIC VALVE REPLACEMENT, TRANSFEMORAL;  Surgeon: [name], MD;  Location: [place];  Service: Open Heart Surgery;  Laterality: Left;   Family History  Problem Relation Age of Onset  . Diabetes Father   . Heart disease Father   . Diabetes Sister   . Alzheimer's disease Mother   . Stroke Mother   . Diabetes Paternal Grandmother   . Alzheimer's disease Maternal Aunt        x 8 Maternal Aunts  . Cancer Maternal Uncle        type unknown   Social History:  reports that he quit smoking about 35 years ago. He has a 64.00 pack-year smoking history. He has never used smokeless tobacco. He reports current alcohol use. He reports that he does not use drugs.  ROS: As per HPI otherwise negative. Physical Exam: Vitals:   10/23/19 0445 10/23/19 0446 10/23/19 0825 10/23/19 0924  BP: (!) 76/48   (!) 72/38  Pulse: (!) 54 64 65 (!) 55  Resp: 18  18 16  Temp: 98.1 F (36.7 C)   98.2 F (36.8 C)  TempSrc: Oral   Oral  SpO2:  100% 100% 98%  Weight:      Height:         General: Well developed, chronically ill appearing male, alert and in NAD Head: Normocephalic, atraumatic, sclera non-icteric, mucus membranes are moist. Neck: JVD not elevated. Lungs: Clear bilaterally to auscultation without wheezes, rales, or rhonchi. On O2 via nasal cannula Heart:  Irregularly irregular with normal S1, S2. No murmurs, rubs, or gallops appreciated. Abdomen: Soft, non-tender, non-distended with normoactive bowel sounds. No rebound/guarding. No obvious abdominal masses. Lower extremities: Erythema extending up to R mid calf with necrotic appearing lesion on second toe. 1+ pitting edema. L BKA stump with trace edema.  Neuro: Alert and oriented X 3. Moves all  extremities spontaneously. Psych:  Responds to questions appropriately with a normal affect. Dialysis Access: LUE AVF + bruit  Allergies  Allergen Reactions  . Byetta 10 Mcg Pen [Exenatide] Diarrhea and Nausea And Vomiting  . Telmisartan Other (See Comments)    Unknown reaction - reported by [facility]  . Codeine Itching    Minor reaction  . Coumadin [Warfarin Sodium] Rash    (wife states coumadin was stopped but rash did not disappear 02/21/19)   Prior to Admission medications   Medication Sig Start Date End Date Taking? Authorizing Provider  acetaminophen (TYLENOL) 500 MG tablet Take 1 tablet (500 mg total) by mouth every 6 (six) hours as needed for mild pain or headache (pain). Patient taking differently: Take 1,000 mg by mouth every 6 (six) hours as needed for headache (pain).  04/18/19  Yes Pokhrel, L[place], MD  albuterol (PROVENTIL HFA;VENTOLIN HFA) 108 (90 Base) MCG/ACT inhaler Inhale 2 puffs into the lungs every 4 (four) hours as needed for wheezing or shortness of breath. 11/27/18  Yes Tanda Rockers, MD  albuterol (PROVENTIL) (2.5 MG/3ML) 0.083% nebulizer solution Take 3 mLs (2.5 mg total) by nebulization every 6 (six) hours as needed for wheezing or shortness of breath. 01/07/19  Yes Scot Jun, FNP  ALPRAZolam Duanne Moron) 0.25 MG tablet Take 1 tablet (0.25 mg total) by mouth 2 (two) times daily as needed for anxiety. Patient taking differently: Take 0.25 mg by mouth 2 (two) times daily as needed (severe anxiety/panic attacks).  02/24/19  Yes Norval Morton, MD  apixaban (ELIQUIS) 2.5 MG TABS tablet Take 1 tablet (2.5 mg total) by mouth 2 (two) times daily. 04/29/19  Yes Deboraha Sprang, MD  atorvastatin (LIPITOR) 80 MG tablet Take 1 tablet (80 mg total) by mouth daily at 6 PM. 01/07/19  Yes Scot Jun, FNP  budesonide-formoterol [facility]) 160-4.5 MCG/ACT inhaler Inhale 2 puffs into the lungs 2 (two) times daily as needed (shortness of breath).    Yes [provider]   cinacalcet (SENSIPAR) 30 MG tablet Take 30 mg by mouth See admin instructions. Take one tablet (30 mg) by mouth on Monday, Tuesday, Thursday, Saturday after dialysis   Yes [provider]  clopidogrel (PLAVIX) 75 MG tablet Take 1 tablet (75 mg total) by mouth daily. 04/29/19  Yes Deboraha Sprang, MD  LEVEMIR FLEXTOUCH 100 UNIT/ML Pen INJECT 48 UNITS TOTAL INTO THE SKIN AT BEDTIME. Patient taking differently: Inject 20 Units into the skin at bedtime.  07/17/19  Yes Charlott Rakes, MD  lidocaine-prilocaine (EMLA) cream Apply 1 application topically See admin instructions. Apply to port access prior to dialysis on Monday, Tuesday, Thursday, Saturday   Yes [provider]  loperamide (IMODIUM) 2 MG capsule Take 2 mg by mouth 4 (four) times daily as needed for diarrhea or loose stools.  02/26/19 02/25/20 Yes [provider]  loratadine (CLARITIN) 10 MG tablet Take 10 mg by mouth daily.    Yes [provider]  metoprolol tartrate (LOPRESSOR) 25 MG tablet Take 0.5 tablets (12.5 mg total) by mouth See admin instructions. Take 1/2 tablet (12.5 mg) by mouth on Sunday, Monday, Wednesday, Friday mornings (non-dialysis days), take 1/2 tablet (12.5 mg) every evening Patient taking differently: Take 12.5 mg by mouth See admin instructions. Take 1/2 tablet (12.5 mg) by mouth on Sunday, Wednesday, Friday mornings (non-dialysis days), take 1/2 tablet (12.5 mg) every evening 04/29/19  Yes Deboraha Sprang, MD  midodrine (PROAMATINE) 10 MG tablet Take 10 mg by mouth See admin instructions. Take one tablet (10 mg) by mouth 30 minutes before dialysis on Monday, Tuesday, Thursday, Saturday 02/05/19  Yes [provider]  midodrine (PROAMATINE) 2.5 MG tablet Take 1 tablet (2.5 mg total) by mouth 3 (three) times daily with meals. Patient should continue to take 10 mg in the morning prior to hemodialysis sessions. Patient taking differently: Take 2.5 mg by mouth See admin instructions. Take one  tablet (2.5 mg) by mouth three times daily on Sunday, Wednesday, Friday (non-dialysis days), take one tablet (2.5 mg) after dialysis on Monday, Tuesday, Thursday, Saturday (continue to take 10 mg on Monday, Tuesday, Thursday, Saturday before dialysis) 04/24/19  Yes Scot Jun, FNP  nitroGLYCERIN (NITROSTAT) 0.4 MG SL tablet Place 1 tablet (0.4 mg total) under the tongue every 5 (five) minutes as needed for chest pain. 04/19/18  Yes Rai, Ripudeep K, MD  OXYGEN Inhale 3 L into the lungs continuous.    Yes [provider]  pantoprazole (PROTONIX) 40 MG tablet Take 1 tablet (40 mg total) by mouth daily. 04/29/19  Yes Deboraha Sprang, MD  sevelamer carbonate (RENVELA) 800 MG tablet Take 2,400 mg by mouth See admin instructions. Take three tablets (2400 mg) by mouth once or twice daily with meals   Yes [provider]  doxercalciferol (HECTOROL) 4 MCG/2ML injection Inject 0.5 mLs (1 mcg total) into the vein Every Tuesday,Thursday,and Saturday with dialysis. 02/26/19   Fuller Plan A, MD  Insulin Pen Needle 31G X 6 MM MISC at bedtime.  08/28/18   [provider]   Current Facility-Administered Medications  Medication Dose Route Frequency Provider Last Rate Last Dose  . acetaminophen (TYLENOL) tablet 650 mg  650 mg Oral Q6H PRN Lenore Cordia, MD   650 mg at 10/23/19 [id number]   Or  . acetaminophen (TYLENOL) suppository 650 mg  650 mg Rectal Q6H PRN Zada Finders R, MD      . albuterol (PROVENTIL) (2.5 MG/3ML) 0.083% nebulizer solution 3 mL  3 mL Inhalation Q4H PRN Lenore Cordia, MD      . ALPRAZolam Duanne Moron) tablet 0.25 mg  0.25 mg Oral BID PRN Zada Finders R, MD   0.25 mg at 10/23/19 0801  . atorvastatin (LIPITOR) tablet 80 mg  80 mg Oral q1800 Zada Finders R, MD      . cefTRIAXone (ROCEPHIN) 2 g in sodium chloride 0.9 % 100 mL IVPB  2 g Intravenous Q24H Lenore Cordia, MD      . Derrill Memo ON 10/24/2019] cinacalcet (SENSIPAR) tablet 30 mg  30 mg Oral Once per day on Mon Tue Thu  Sat Lenore Cordia, MD      . heparin  ADULT infusion 100 units/mL (25000 units/25mL sodium chloride 0.45%)  1,400 Units/hr Intravenous Continuous Laren Everts, RPH 14 mL/hr at 10/23/19 0816 1,400 Units/hr at 10/23/19 0816  . insulin aspart (novoLOG) injection 0-6 Units  0-6 Units Subcutaneous TID WC Patel, Vishal R, MD      . insulin detemir (LEVEMIR) injection 20 Units  20 Units Subcutaneous QHS Lenore Cordia, MD   20 Units at 10/23/19 0047  . [START ON 10/24/2019] midodrine (PROAMATINE) tablet 10 mg  10 mg Oral Once per day on Mon Tue Thu Sat Zada Finders R, MD      . midodrine (PROAMATINE) tablet 2.5 mg  2.5 mg Oral 3 times per day on Sun Wed Fri Zada Finders R, MD   2.5 mg at 10/23/19 [id number]  . [START ON 10/24/2019] midodrine (PROAMATINE) tablet 2.5 mg  2.5 mg Oral Once per day on Mon Tue Thu Sat Zada Finders R, MD      . mometasone-formoterol [facility]) 200-5 MCG/ACT inhaler 2 puff  2 puff Inhalation BID Lenore Cordia, MD   2 puff at 10/23/19 [id number]  . sodium chloride flush (NS) 0.9 % injection 3 mL  3 mL Intravenous Q12H Zada Finders R, MD   3 mL at 10/23/19 0820  . vancomycin variable dose per unstable renal function (pharmacist dosing)   Does not apply See admin instructions Romona Curls [facility]       Labs: Basic Metabolic Panel: Recent Labs  Lab 10/21/19 1543 10/22/19 1931 10/23/19 0347  NA 133* 132* 134*  K 4.3 4.1 4.0  CL 92* 94* 95*  CO2 27 27 27   GLUCOSE 127* 150* 149*  BUN 25* 27* 29*  CREATININE 4.19* 4.09* 4.18*  CALCIUM 8.0* 7.9* 7.7*  PHOS  --   --  4.3   Liver Function Tests: Recent Labs  Lab 10/21/19 1543 10/22/19 1931 10/23/19 0347  AST 35 39  --   ALT 15 17  --   ALKPHOS 146* 126  --   BILITOT 2.9* 2.3*  --   PROT 6.5 5.4*  --   ALBUMIN 2.4* 1.9* 1.8*   No results for input(s): LIPASE, AMYLASE in the last 168 hours. No results for input(s): AMMONIA in the last 168 hours. CBC: Recent Labs  Lab 10/21/19 1543 10/22/19 1931 10/23/19 0347  WBC  10.9* 12.5* 12.8*  NEUTROABS 8.4* 9.8*  --   HGB 11.7* 10.6* 10.4*  HCT 36.5* 32.1* 31.6*  MCV 94.8 91.7 90.8  PLT 182 191 150   Cardiac Enzymes: No results for input(s): CKTOTAL, CKMB, CKMBINDEX, TROPONINI in the last 168 hours. CBG: Recent Labs  Lab 10/23/19 0029 10/23/19 0755  GLUCAP 149* 136*   Iron Studies: No results for input(s): IRON, TIBC, TRANSFERRIN, FERRITIN in the last 72 hours. Studies/Results: Dg Chest Port 1 View  Result Date: 10/22/2019 CLINICAL DATA:  End-stage renal disease with hypertension. EXAM: PORTABLE CHEST 1 VIEW COMPARISON:  09/08/2019 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 size remains enlarged. Aortic calcifications are noted. The lung bases are only partially visualized, however there appears to be a right-sided pleural effusion with a possible right lower lobe airspace opacity. The lung apices are suboptimally evaluated secondary to patient positioning. There is no pneumothorax. The patient is status post prior TAVR. IMPRESSION: 1. Limited study as detailed above. 2. Probable right-sided pleural effusion. There is a right lower lung zone airspace opacity which may represent atelectasis or infiltrate. 3. Persistent cardiomegaly. Electronically Signed  By: Constance Holster M.D.   On: 10/22/2019 19:45   Dg Foot Complete Right  Result Date: 10/21/2019 CLINICAL DATA:  Right foot infection. EXAM: RIGHT FOOT COMPLETE - 3+ VIEW COMPARISON:  None. FINDINGS: Questionable osteolysis at the tip of second distal phalanx. No acute fracture or dislocation. Joint spaces are preserved. Mild dorsal midfoot degenerative spurring. Bone mineralization is normal. Plantar enthesophyte. No subcutaneous emphysema. Diffuse atherosclerotic vascular calcification. IMPRESSION: 1. Questionable osteolysis at the tip of the second distal phalanx, potentially reflecting osteomyelitis. Consider MRI for further evaluation. Electronically Signed   By:  Titus Dubin M.D.   On: 10/21/2019 16:58    Dialysis Orders: Center: [facility]  on [place]. Time: 4 hours, 200NRe, BFR 400, BFR 800, EDW 97kg, 2K/2.25 Ca, UF profile 2 Hectorol 1 mcg IV q HD Mircera 41mcg IV q 2 weeks- last dose 11/24 No heparin  Assessment/Plan: 1.  Cellulitis/osteomyelitis L foot: On empiric antibiotics. Patient expresses that he would like to avoid amputation if at all possible. Per primary/vascular.  2.  ESRD:  MTTS. Received abbreviated treatment yesterday. Slightly volume overloaded, hypotensive, no increased O2 requirement. K+ 4.0. Will plan HD tomorrow per regular schedule.  3.  Hypertension/volume: Chronically hypotensive. Some edema on exam but no increased O2 requirement. Continue midodrine 2.5 mg PO TID and 10mg  before dialysis. If bed weight is accurate, he is 10kg above EDW, however yesterday he was 3.7kg above post-HD. UF as tolerated with dialysis.  4.  Anemia: Hemoglobin 10.4. Not due for ESA yet. 5.  Metabolic bone disease: Corrected calcium 9.5. Continue hectorol. Phos 4.3. Will resume renvela. 6.  Nutrition:  Renal/carb modified diet with fluid restrictions.  7. Permanent a. Fib/ heart block: rate controlled/slightly bradycardic. Has pacemaker. Per primary 8. COPD: On O2 3L at baseline. Of note, patient reports he does not have COPD. 9. T2DM: Insulin per primary.   Anice Paganini, PA-C 10/23/2019, 12:02 PM  [facility] Pager: [phone]

## 2019-10-23 NOTE — Consult Note (Addendum)
[facility] NOTE   MRN : [id number]  Reason for Consult:  Right foot wound. Referring Physician: Dr. Zada Finders  History of Present Illness: 78 y/o male admitted with right foot wound He has a history of PAD s/p left BKA.  He was seen in the ED yesterday (10/21/2019) for evaluation.  X-ray of the right foot showed possible osteomyelitis of the second distal phalanx.  Patient was given IV vancomycin and cefepime while in the ED.  He states his lateral malleolus and heel wounds are healing, but the toe wounds are not.  He developed the lower leg erythema on Wednesday after the wound center MD removed his dressing.  He wants to keep his leg if at all possible.  He is not weight bearing on the right LE, he is transported via lift at home.    He is followed by Dr. Scot Dock.  03/27/2019 with noted a superficial ulceration on his right lateral malleolus.  Angiogram demonstrated On the right side, the common femoral, deep femoral, and superficial femoral arteries are patent.  There is diffuse calcific disease throughout the superficial femoral artery which has moderate diffuse disease.  The popliteal artery has moderate calcific disease.  There is severe tibial disease on the right.  Past medical history includes:  ESRD on [facility] HD, CAD s/p PCI, AS s/p TAVR, CHB s/p PPM, permanent A. 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     Current Facility-Administered Medications  Medication Dose Route Frequency Provider Last Rate Last Dose  . acetaminophen (TYLENOL) tablet 650 mg  650 mg Oral Q6H PRN Lenore Cordia, MD   650 mg at 10/23/19 [id number]   Or  . acetaminophen (TYLENOL) suppository 650 mg  650 mg Rectal Q6H PRN Zada Finders R,  MD      . albuterol (PROVENTIL) (2.5 MG/3ML) 0.083% nebulizer solution 3 mL  3 mL Inhalation Q4H PRN Lenore Cordia, MD      . ALPRAZolam Duanne Moron) tablet 0.25 mg  0.25 mg Oral BID PRN Zada Finders R, MD   0.25 mg at 10/23/19 0801  . atorvastatin (LIPITOR) tablet 80 mg  80 mg Oral q1800 Zada Finders R, MD      . cefTRIAXone (ROCEPHIN) 2 g in sodium chloride 0.9 % 100 mL IVPB  2 g Intravenous Q24H Lenore Cordia, MD      . Derrill Memo ON 10/24/2019] cinacalcet (SENSIPAR) tablet 30 mg  30 mg Oral Once per day on Mon Tue Thu Sat Zada Finders R, MD      . heparin ADULT infusion 100 units/mL (25000 units/298mL sodium chloride 0.45%)  1,400 Units/hr Intravenous Continuous Laren Everts, RPH 14 mL/hr at 10/23/19 0816 1,400 Units/hr at 10/23/19 0816  . insulin aspart (novoLOG) injection 0-6 Units  0-6 Units Subcutaneous TID WC Patel, Vishal R, MD      . insulin detemir (LEVEMIR) injection 20 Units  20 Units Subcutaneous QHS Lenore Cordia, MD   20 Units at 10/23/19 0047  . [START ON 10/24/2019] midodrine (PROAMATINE) tablet 10 mg  10 mg Oral Once per day on Mon Tue Thu Sat Zada Finders R, MD      . midodrine (PROAMATINE) tablet 2.5 mg  2.5 mg Oral 3 times per day on Sun Wed  Ludwig Clarks Zada Finders R, MD   2.5 mg at 10/23/19 [id number]  . [START ON 10/24/2019] midodrine (PROAMATINE) tablet 2.5 mg  2.5 mg Oral Once per day on Mon Tue Thu Sat Zada Finders R, MD      . mometasone-formoterol [facility]) 200-5 MCG/ACT inhaler 2 puff  2 puff Inhalation BID Lenore Cordia, MD   2 puff at 10/23/19 [id number]  . sodium chloride flush (NS) 0.9 % injection 3 mL  3 mL Intravenous Q12H Zada Finders R, MD   3 mL at 10/23/19 0820  . vancomycin variable dose per unstable renal function (pharmacist dosing)   Does not apply See admin instructions Romona Curls, RPH        Pt meds include: Statin :No Betablocker: No ASA: No Other anticoagulants/antiplatelets: Eliquis  Past Medical History:  Diagnosis Date  . Anginal pain ([place])   .  Aortic stenosis    a. severe by echo 09/2014  . Atrial fibrillation ([place])    a. not well documented, not on anticoagulation  . CHF (congestive heart failure) ([place])    04/28/17 echo-EF 40%, mod LVH, diastolic dysfunction  . Claustrophobia   . Complete heart block ([place])   . COPD (chronic obstructive pulmonary disease) ([place])   . Coronary artery disease    a. chronically occluded RCA per cath 09/2014 with collaterals B. cath 05/01/17 chr occ RCA w/collaterals, 60-70% mid LAD,   . CVA (cerebral vascular accident) ([place]) 10/2014   denies residual on 07/11/2015  . ESRD (end stage renal disease) on dialysis [facility])    a. on dialysis; [place] [facility]; MWF, LUE fistula (07/11/2015)  . History of blood transfusion    "related to gallbladder OR"  . History of stomach ulcers   . Hyperlipidemia   . Hypertension   . Iron deficiency anemia   . Myocardial infarction ([place]) 10/2014  . Peripheral vascular disease ([place])   . Pneumonia   . Presence of permanent cardiac pacemaker   . 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 Type II diabetes mellitus ([place])     Past Surgical History:  Procedure Laterality Date  . ABDOMINAL AORTOGRAM W/LOWER EXTREMITY Bilateral 03/27/2019   Procedure: ABDOMINAL AORTOGRAM W/LOWER EXTREMITY;  Surgeon: Angelia Mould, MD;  Location: [place] [facility];  Service: Cardiovascular;  Laterality: Bilateral;  . AMPUTATION Left 04/14/2019   Procedure: AMPUTATION BELOW KNEE;  Surgeon: Angelia Mould, MD;  Location: [place];  Service: Vascular;  Laterality: Left;  . AV FISTULA PLACEMENT Left 10/19/2014   Procedure: BRACHIOCEPHALIC ARTERIOVENOUS (AV) FISTULA CREATION ;  Surgeon: Conrad Waveland, MD;  Location: [place];  Service: Vascular;  Laterality: Left;  . CARDIAC CATHETERIZATION    . CARDIAC CATHETERIZATION N/A 07/22/2015   Procedure: Right/Left Heart Cath and Coronary  Angiography;  Surgeon: [name], MD;  Location: [place];  Service: Cardiovascular;  Laterality: N/A;  . CATARACT EXTRACTION W/ INTRAOCULAR LENS  IMPLANT, BILATERAL Bilateral 1990's  . CHOLECYSTECTOMY OPEN  1980's  . COLONOSCOPY W/ BIOPSIES AND POLYPECTOMY    . CORONARY ANGIOGRAPHY N/A 07/31/2018   Procedure: CORONARY ANGIOGRAPHY;  Surgeon: Martinique, Peter M, MD;  Location: [facility];  Service: Cardiovascular;  Laterality: N/A;  . CORONARY ANGIOPLASTY    . CORONARY ATHERECTOMY N/A 04/18/2018   Procedure: CORONARY ATHERECTOMY;  Surgeon: [name], MD;  Location: [facility];  Service:  Cardiovascular;  Laterality: N/A;  . CORONARY BALLOON ANGIOPLASTY N/A 07/31/2018   Procedure: CORONARY BALLOON ANGIOPLASTY;  Surgeon: Martinique, Peter M, MD;  Location: [place] [facility];  Service: Cardiovascular;  Laterality: N/A;  . CORONARY STENT INTERVENTION N/A 04/18/2018   Procedure: CORONARY STENT INTERVENTION;  Surgeon: [name], MD;  Location: [place] [facility];  Service: Cardiovascular;  Laterality: N/A;  . EP IMPLANTABLE DEVICE N/A 07/11/2015   Procedure: Pacemaker Implant;  Surgeon: [name], MD;  Location: [place];  Service: Cardiovascular;  Laterality: N/A;  . ESOPHAGOGASTRODUODENOSCOPY  08/01/2012   Procedure: ESOPHAGOGASTRODUODENOSCOPY (EGD);  Surgeon: [name], MD;  Location: [facility];  Service: Endoscopy;  Laterality: N/A;  . INSERT / REPLACE / REMOVE PACEMAKER  07/11/2015  . INSERTION OF DIALYSIS CATHETER Right 02/02/2015   Procedure: INSERTION OF DIALYSIS CATHETER  RIGHT INTERNAL JUGULAR;  Surgeon: Mal [name], MD;  Location: [facility];  Service: Vascular;  Laterality: Right;  . LEFT AND RIGHT HEART CATHETERIZATION WITH CORONARY ANGIOGRAM N/A 09/30/2014   Procedure: LEFT AND RIGHT HEART CATHETERIZATION WITH CORONARY ANGIOGRAM;  Surgeon: [name], MD;  Location: [facility] CATH LAB;  Service: Cardiovascular;   Laterality: N/A;  . LEFT HEART CATH AND CORONARY ANGIOGRAPHY N/A 04/14/2018   Procedure: LEFT HEART CATH AND CORONARY ANGIOGRAPHY;  Surgeon: [name], MD;  Location: [facility];  Service: Cardiovascular;  Laterality: N/A;  . TEE WITHOUT CARDIOVERSION N/A 08/02/2015   Procedure: TRANSESOPHAGEAL ECHOCARDIOGRAM (TEE);  Surgeon: [name], MD;  Location: [facility];  Service: Open Heart Surgery;  Laterality: N/A;  . TONSILLECTOMY    . TRANSCATHETER AORTIC VALVE REPLACEMENT, TRANSFEMORAL Left 08/02/2015   Procedure: TRANSCATHETER AORTIC VALVE REPLACEMENT, TRANSFEMORAL;  Surgeon: [name], MD;  Location: [place];  Service: Open Heart Surgery;  Laterality: Left;    Social History Social History   Tobacco Use  . Smoking status: Former Smoker    Packs/day: 2.00    Years: 32.00    Pack years: 64.00    Quit date: 11/27/1983    Years since quitting: 35.9  . Smokeless tobacco: Never Used  Substance Use Topics  . Alcohol use: Yes    Comment: rarely  . Drug use: No    Family History Family History  Problem Relation Age of Onset  . Diabetes Father   . Heart disease Father   . Diabetes Sister   . Alzheimer's disease Mother   . Stroke Mother   . Diabetes Paternal Grandmother   . Alzheimer's disease Maternal Aunt        x 8 Maternal Aunts  . Cancer Maternal Uncle        type unknown    Allergies  Allergen Reactions  . Byetta 10 Mcg Pen [Exenatide] Diarrhea and Nausea And Vomiting  . Telmisartan Other (See Comments)    Unknown reaction - reported by [facility]  . Codeine Itching    Minor reaction  . Coumadin [Warfarin Sodium] Rash    (wife states coumadin was stopped but rash did not disappear 02/21/19)     REVIEW OF SYSTEMS  General: [ ]  Weight loss, [ ]  Fever, [ ]  chills Neurologic: [ ]  Dizziness, [ ]  Blackouts, [ ]  Seizure [ ]  Stroke, [ ]  "Mini stroke", [ ]  Slurred speech, [ ]  Temporary blindness; [ ]  weakness in arms or legs, [ ]  Hoarseness [ ]  Dysphagia Cardiac:  [ ]  Chest pain/pressure, [ ]  Shortness of breath at rest [ ]  Shortness of breath  with exertion, [ ]  Atrial fibrillation or irregular heartbeat  Vascular: [ ]  Pain in legs with walking, [ ]  Pain in legs at rest, [ ]  Pain in legs at night,  [ ]  Non-healing ulcer, [ ]  Blood clot in vein/DVT,   Pulmonary: [ ]  Home oxygen, [ ]  Productive cough, [ ]  Coughing up blood, [ ]  Asthma,  [ ]  Wheezing [ ]  COPD Musculoskeletal:  [ ]  Arthritis, [ ]  Low back pain, [ ]  Joint pain Hematologic: [ ]  Easy Bruising, [ ]  Anemia; [ ]  Hepatitis Gastrointestinal: [ ]  Blood in stool, [ ]  Gastroesophageal Reflux/heartburn, Urinary: [ ]  chronic Kidney disease, [ ]  on HD - [ ]  MWF or [ ]  TTHS, [ ]  Burning with urination, [ ]  Difficulty urinating Skin: [ ]  Rashes, [ ]  Wounds Psychological: [ ]  Anxiety, [ ]  Depression  Physical Examination Vitals:   10/23/19 0445 10/23/19 0446 10/23/19 0825 10/23/19 0924  BP: (!) 76/48   (!) 72/38  Pulse: (!) 54 64 65 (!) 55  Resp: 18  18 16   Temp: 98.1 F (36.7 C)   98.2 F (36.8 C)  TempSrc: Oral   Oral  SpO2:  100% 100% 98%  Weight:      Height:       Body mass index is 32.92 kg/m.  General:  WDWN in NAD Gait: Normal HENT: WNL Eyes: Pupils equal Pulmonary: normal non-labored breathing , without Rales, rhonchi,  wheezing Cardiac: RRR, without  Murmurs, rubs or gallops; No carotid bruits Abdomen: soft, NT, no masses Skin:right LE erythema Heel  ateral malleolus  Right foot toes    Vascular Exam/Pulses:Healed left BKA, right leg without pedal pulses, no palpable popliteal pulse.  Weak right femoral pulse.  Intact active range of motion toes and ankle right foot.   Musculoskeletal: no muscle wasting or atrophy; no edema  Neurologic: A&O X 3; Appropriate Affect ;  SENSATION: normal; MOTOR FUNCTION: 5/5 Symmetric Speech is fluent/normal   Significant Diagnostic Studies: CBC Lab Results  Component Value Date   WBC 12.8 (H) 10/23/2019   HGB 10.4 (L) 10/23/2019    HCT 31.6 (L) 10/23/2019   MCV 90.8 10/23/2019   PLT 150 10/23/2019    BMET    Component Value Date/Time   NA 134 (L) 10/23/2019 0347   NA 137 01/07/2019 1108   K 4.0 10/23/2019 0347   CL 95 (L) 10/23/2019 0347   CO2 27 10/23/2019 0347   GLUCOSE 149 (H) 10/23/2019 0347   BUN 29 (H) 10/23/2019 0347   BUN 29 (H) 01/07/2019 1108   CREATININE 4.18 (H) 10/23/2019 0347   CALCIUM 7.7 (L) 10/23/2019 0347   GFRNONAA 13 (L) 10/23/2019 0347   GFRAA 15 (L) 10/23/2019 0347   Estimated Creatinine Clearance: 17.5 mL/min (A) (by C-G formula based on SCr of 4.18 mg/dL (H)).  COAG Lab Results  Component Value Date   INR 3.2 (H) 10/22/2019   INR 2.3 (H) 10/21/2019   INR 1.7 (H) 04/13/2019     Non-Invasive Vascular Imaging:  03/27/2019 FINDINGS:   1.  There are single renal arteries bilaterally with no significant renal artery stenosis.  The infrarenal aorta, bilateral common iliac arteries, bilateral external iliac arteries, and bilateral hypogastric arteries are open. 2. On the left side, which is the side of concern, there is diffuse calcific disease throughout his common femoral, superficial femoral, popliteal, and tibial vessels.  However, the superficial femoral artery, popliteal artery arteries are patent without critical stenosis.  There is diffuse disease  in the posterior tibial, peroneal, and anterior tibial arteries but the vessels are patent.  The most diseased artery is the anterior tibial artery which has several areas of severe disease.  These arteries are markedly calcified. 3.  On the right side, the common femoral, deep femoral, and superficial femoral arteries are patent.  There is diffuse calcific disease throughout the superficial femoral artery which has moderate diffuse disease.  The popliteal artery has moderate calcific disease.  There is severe tibial disease on the right.  CLINICAL NOTE: Based on his arteriogram I think he does have reasonable flow to the left foot  with no real good options for revascularization.    ASSESSMENT/PLAN:  Sever tibial disease without frank occlusion based on the last angiogram.  He has progressive wounds.  The lateral malleolus has not shown much change, but he now has multiple foot ulcers and cellulitis in the lower leg.  Evidence of gangrene on the digits and possible osteolysis at the tip of the second distal phalanx.  He states he does not use the right LE for weight bearing activities he uses a [employer] fast streamer at home for transfers.  He denise fever and chills. He wants to do everything possible to save his leg at this time.     Roxy Horseman 10/23/2019 11:17 AM   I have examined the patient, reviewed and agree with above.  I reviewed the actual arteriogram films from the Mar 27, 2019 arteriogram.  Patient had similar pattern of disease in his right and left leg.  Had irregularity but patency throughout his superficial femoral arteries, popliteal arteries and proximal tibial vessels.  Actually had patency of his anterior tibial, peroneal and posterior tibial on the right down to the ankle.  Severe foot disease.  Patient had a chronic lateral malleolus wound and is now had progressive ischemic changes in his foot.  Reports that he is having more severe pain as well and reports that this is like a severe tooth ache.  He does have an easily palpable femoral pulse.  I do not palpate a popliteal pulse.  I had a long discussion with the patient and his wife present.  He does not do any weightbearing on his right foot.  He does not stand to transfer.  He is in a [place] lift from bed to chair at home and also at dialysis and also in the wound center.  I feel that he has no option other than amputation.  He is still not willing to agree to this.  I explained that indications would be progressive pain which she is having and also progressive tissue loss which is also present.  Also discussed this case with Dr.Dickson.  Recommend  proceeding with right leg amputation on Monday if patient agrees.  Agree with continued heparin for now.  Can resume Eliquis following surgery  Curt Jews, MD 10/23/2019 2:29 PM

## 2019-10-23 NOTE — Progress Notes (Signed)
Critical high aPTT of greater than 200. MD Neysa Bonito notified. Heparin drip put on hold and redraw of aPTT ordered.

## 2019-10-23 NOTE — Progress Notes (Addendum)
Call received from Pharmacist regarding Heparin infusion-restarted at this time @12 .37ml/hr.  0235 BG 62, 2 cups of grape juice given, will recheck BG in 15 minutes.Pt is alert and verbally responsive, able to follow commands.  0250 BG 65, updated CN on pt condition and gave pt grape juice with 4 packs of sugar, will recheck blood glucose in 15 min.  0315 BG 62, paged Dr Kennon Holter and will give glucose gel and recheck BG in 15 min.  0324 Glucose gel given at this time.  0340 Blood glucose 93 will recheck in 1 hr.  0427 Pharmacist called and gave new rate change to Heparin infusion which is now @ 11 ml/hr.  0440 Blood glucose 94, pt given CHG bath  with bed pad and gown change, resting quietly after care given.  0656 Report given to Alex Gardener, Dialysis Tech.

## 2019-10-24 ENCOUNTER — Encounter (HOSPITAL_COMMUNITY): Payer: Self-pay

## 2019-10-24 LAB — CBC
HCT: 36.1 % — ABNORMAL LOW (ref 39.0–52.0)
Hemoglobin: 11.7 g/dL — ABNORMAL LOW (ref 13.0–17.0)
MCH: 30.3 pg (ref 26.0–34.0)
MCHC: 32.4 g/dL (ref 30.0–36.0)
MCV: 93.5 fL (ref 80.0–100.0)
Platelets: 191 10*3/uL (ref 150–400)
RBC: 3.86 MIL/uL — ABNORMAL LOW (ref 4.22–5.81)
RDW: 18.4 % — ABNORMAL HIGH (ref 11.5–15.5)
WBC: 12.7 10*3/uL — ABNORMAL HIGH (ref 4.0–10.5)
nRBC: 0 % (ref 0.0–0.2)

## 2019-10-24 LAB — BASIC METABOLIC PANEL
Anion gap: 18 — ABNORMAL HIGH (ref 5–15)
BUN: 35 mg/dL — ABNORMAL HIGH (ref 8–23)
CO2: 24 mmol/L (ref 22–32)
Calcium: 8.4 mg/dL — ABNORMAL LOW (ref 8.9–10.3)
Chloride: 94 mmol/L — ABNORMAL LOW (ref 98–111)
Creatinine, Ser: 4.89 mg/dL — ABNORMAL HIGH (ref 0.61–1.24)
GFR calc Af Amer: 12 mL/min — ABNORMAL LOW (ref >60)
GFR calc non Af Amer: 10 mL/min — ABNORMAL LOW (ref >60)
Glucose, Bld: 60 mg/dL — ABNORMAL LOW (ref 70–99)
Potassium: 5.1 mmol/L (ref 3.5–5.1)
Sodium: 136 mmol/L (ref 135–145)

## 2019-10-24 LAB — APTT
aPTT: 122 seconds — ABNORMAL HIGH (ref 24–36)
aPTT: 188 seconds (ref 24–36)
aPTT: 107 seconds — ABNORMAL HIGH (ref 24–36)

## 2019-10-24 LAB — TSH: TSH: 4.329 u[IU]/mL (ref 0.350–4.500)

## 2019-10-24 LAB — HEPARIN LEVEL (UNFRACTIONATED): Heparin Unfractionated: >2.2 IU/mL — ABNORMAL HIGH (ref 0.30–0.70)

## 2019-10-24 LAB — GLUCOSE, CAPILLARY
Glucose-Capillary: 93 mg/dL (ref 70–99)
Glucose-Capillary: 107 mg/dL — ABNORMAL HIGH (ref 70–99)
Glucose-Capillary: 65 mg/dL — ABNORMAL LOW (ref 70–99)
Glucose-Capillary: 114 mg/dL — ABNORMAL HIGH (ref 70–99)

## 2019-10-24 LAB — VANCOMYCIN, RANDOM: Vancomycin Rm: 21

## 2019-10-24 MED ORDER — GLUCOSE 40 % PO GEL
ORAL | Status: AC
  Administered 2019-10-24: 03:00:00
  Filled 2019-10-24: qty 1

## 2019-10-24 MED ORDER — DOXERCALCIFEROL 4 MCG/2ML IV SOLN
INTRAVENOUS | Status: AC
  Filled 2019-10-24: qty 2

## 2019-10-24 MED ORDER — MIDODRINE HCL 5 MG PO TABS
ORAL_TABLET | ORAL | Status: AC
  Filled 2019-10-24: qty 2

## 2019-10-24 MED ORDER — VANCOMYCIN HCL IN DEXTROSE 1-5 GM/200ML-% IV SOLN
1000.0000 mg | INTRAVENOUS | Status: DC
  Administered 2019-10-24 – 2019-10-27 (×2): 1000 mg via INTRAVENOUS
  Filled 2019-10-24 (×3): qty 200

## 2019-10-24 MED ORDER — VANCOMYCIN HCL IN DEXTROSE 1-5 GM/200ML-% IV SOLN
1000.0000 mg | INTRAVENOUS | Status: DC
  Filled 2019-10-24: qty 200

## 2019-10-24 MED ORDER — INSULIN DETEMIR 100 UNIT/ML ~~LOC~~ SOLN
10.0000 [IU] | Freq: Every day | SUBCUTANEOUS | Status: DC
  Administered 2019-10-26 – 2019-10-29 (×4): 10 [IU] via SUBCUTANEOUS
  Filled 2019-10-24 (×7): qty 0.1

## 2019-10-24 MED ORDER — DEXTROSE 50 % IV SOLN
1.0000 | Freq: Once | INTRAVENOUS | Status: AC
  Administered 2019-10-24: 50 mL via INTRAVENOUS
  Filled 2019-10-24: qty 50

## 2019-10-24 NOTE — Progress Notes (Signed)
[facility] Progress Note   Subjective:   Seen on HD. No new concerns. Denies SOB, CP, palpitations, abdominal pain, N/V/D. Leg pain controlled. Systolic BP in the [id number].   Objective Vitals:   10/24/19 0237 10/24/19 0300 10/24/19 0700 10/24/19 0756  BP: 93/63   (!) 93/57  Pulse: (!) 55     Resp: (!) 22 20  15   Temp: 97.6 F (36.4 C)   97.7 F (36.5 C)  TempSrc: Axillary   Axillary  SpO2: 96%   96%  Weight:   99.8 kg   Height:       Physical Exam General: Well developed, chronically ill appearing male. Alert and in NAD Heart: Regular rhythm, slightly bradycardic. No murmurs, rubs or gallops auscultated Lungs: CTA anteriorly without wheezing, rhonchi or rales. On O2 via nasal cannula Abdomen: Soft, non-tender, non-distended. +BS Extremities: Erythema extending up to R mid calf with necrotic appearing lesion on second toe. 1+ pitting edema. L BKA stump with trace edema.  Dialysis Access: LUE AVF + bruit  Additional Objective Labs: Basic Metabolic Panel: Recent Labs  Lab 10/22/19 1931 10/23/19 0347 10/24/19 0209  NA 132* 134* 136  K 4.1 4.0 5.1  CL 94* 95* 94*  CO2 27 27 24   GLUCOSE 150* 149* 60*  BUN 27* 29* 35*  CREATININE 4.09* 4.18* 4.89*  CALCIUM 7.9* 7.7* 8.4*  PHOS  --  4.3  --    Liver Function Tests: Recent Labs  Lab 10/21/19 1543 10/22/19 1931 10/23/19 0347  AST 35 39  --   ALT 15 17  --   ALKPHOS 146* 126  --   BILITOT 2.9* 2.3*  --   PROT 6.5 5.4*  --   ALBUMIN 2.4* 1.9* 1.8*   No results for input(s): LIPASE, AMYLASE in the last 168 hours. CBC: Recent Labs  Lab 10/21/19 1543 10/22/19 1931 10/23/19 0347 10/24/19 0209  WBC 10.9* 12.5* 12.8* 12.7*  NEUTROABS 8.4* 9.8*  --   --   HGB 11.7* 10.6* 10.4* 11.7*  HCT 36.5* 32.1* 31.6* 36.1*  MCV 94.8 91.7 90.8 93.5  PLT 182 191 150 191   Blood Culture    Component Value Date/Time   SDES BLOOD RIGHT HAND POST VANC/ROCEPHIN 10/22/2019 2158   SPECREQUEST  10/22/2019 2158   BOTTLES DRAWN AEROBIC AND ANAEROBIC Blood Culture adequate volume   CULT  10/22/2019 2158    NO GROWTH < 24 HOURS Performed at [facility] [address], [place], [place] [address]    REPTSTATUS PENDING 10/22/2019 2158    Cardiac Enzymes: No results for input(s): CKTOTAL, CKMB, CKMBINDEX, TROPONINI in the last 168 hours. CBG: Recent Labs  Lab 10/23/19 0029 10/23/19 0755  GLUCAP 149* 136*   Iron Studies: No results for input(s): IRON, TIBC, TRANSFERRIN, FERRITIN in the last 72 hours. @lablastinr3 @ Studies/Results: Dg Chest Port 1 View  Result Date: 10/22/2019 CLINICAL DATA:  End-stage renal disease with hypertension. EXAM: PORTABLE CHEST 1 VIEW COMPARISON:  09/08/2019 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 size remains enlarged. Aortic calcifications are noted. The lung bases are only partially visualized, however there appears to be a right-sided pleural effusion with a possible right lower lobe airspace opacity. The lung apices are suboptimally evaluated secondary to patient positioning. There is no pneumothorax. The patient is status post prior TAVR. IMPRESSION: 1. Limited study as detailed above. 2. Probable right-sided pleural effusion. There is a right lower lung zone airspace opacity which may represent atelectasis  or infiltrate. 3. Persistent cardiomegaly. Electronically Signed   By: Constance Holster M.D.   On: 10/22/2019 19:45   Medications: . cefTRIAXone (ROCEPHIN)  IV 2 g (10/23/19 1725)  . heparin 1,100 Units/hr (10/24/19 0440)  . vancomycin    . [START ON 10/26/2019] vancomycin     . atorvastatin  80 mg Oral q1800  . Chlorhexidine Gluconate Cloth  6 each Topical Q0600  . cinacalcet  30 mg Oral Once per day on Mon Tue Thu Sat  . dextrose  1 ampule Intravenous Once  . doxercalciferol  1 mcg Intravenous Q T,Th,Sa-HD  . insulin aspart  0-6 Units Subcutaneous TID WC  . insulin detemir  10 Units Subcutaneous QHS  . midodrine   10 mg Oral Once per day on Mon Tue Thu Sat  . midodrine  2.5 mg Oral Once per day on Mon Tue Thu Sat  . [START ON 10/25/2019] midodrine  5 mg Oral 3 times per day on Sun Wed Fri  . mometasone-formoterol  2 puff Inhalation BID  . sevelamer carbonate  3,200 mg Oral TID WC  . sodium chloride flush  3 mL Intravenous Q12H  . vancomycin variable dose per unstable renal function (pharmacist dosing)   Does not apply See admin instructions    Dialysis Orders: Center: [facility]  on [place]. Time: 4 hours, 200NRe, BFR 400, BFR 800, EDW 97kg, 2K/2.25 Ca, UF profile 2 Hectorol 1 mcg IV q HD Mircera 61mcg IV q 2 weeks- last dose 11/24 No heparin  Assessment/Plan: 1.  Cellulitis/osteomyelitis L foot: On ceftriaxone and vancomycin. Patient expresses that he would like to avoid amputation if at all possible. Per primary/vascular.  2.  ESRD:  MTTS. Received abbreviated treatment 11/26. Slightly volume overloaded, hypotensive, no increased O2 requirement. K+ 4.0. Continue MTTS schedule.  3.  Hypertension/volume: Chronically hypotensive. Some edema on exam but no increased O2 requirement. Continue midodrine 2.5 mg PO TID and 10mg  before dialysis. 2.8kg above EDW by weights this AM. UF as tolerated with dialysis.  4.  Anemia: Hemoglobin 11.7. No ESA indicated.  5.  Metabolic bone disease: Corrected calcium 10.2, will hold hectorol. Continue sensipar. Phos 4.3. Continue renvela.  6.  Nutrition:  Renal/carb modified diet with fluid restrictions.  7. Permanent a. Fib/ heart block: rate controlled/slightly bradycardic. Has pacemaker. Per primary 8. COPD: On O2 3L at baseline. Of note, patient reports he does not have COPD. 9. T2DM: Insulin per primary.   Anice Paganini, PA-C 10/24/2019, 8:44 AM  [facility] Pager: [phone]

## 2019-10-24 NOTE — Progress Notes (Signed)
Patient ID: Jeffrey [name], male   DOB: [birth year], 78 y.o.   MRN: [id number] Resting comfortably.  Reports pain in his foot. No change in gangrenous changes in his toes. Again discussed recommendation of amputation on Monday.  He seems to be agreeing to this.  His wife is not present.

## 2019-10-24 NOTE — Progress Notes (Signed)
ANTICOAGULATION CONSULT NOTE - Follow Up Consult  Pharmacy Consult for heparin Indication: atrial fibrillation  Labs: Recent Labs    10/21/19 1543  10/22/19 1931 10/23/19 0347 10/23/19 1553 10/23/19 1846 10/24/19 0209  HGB 11.7*  --  10.6* 10.4*  --   --  11.7*  HCT 36.5*  --  32.1* 31.6*  --   --  36.1*  PLT 182  --  191 150  --   --  191  APTT  --    < > 53*  --  >200* 81* 122*  LABPROT 25.2*  --  32.3*  --   --   --   --   INR 2.3*  --  3.2*  --   --   --   --   HEPARINUNFRC  --   --   --   --  >2.20*  --  >2.20*  CREATININE 4.19*  --  4.09* 4.18*  --   --  4.89*   < > = values in this interval not displayed.    Assessment: 78yo male supratherapeutic on heparin after resumed at lower rate; no gtt issues or signs of bleeding per RN.  Goal of Therapy:  Heparin level 66-102 units/ml   Plan:  Will decrease heparin gtt by 1-2 units/kg/hr to 1100 units/hr and check PTT in 8 hours.    Wynona Neat, PharmD, BCPS  10/24/2019,4:34 AM

## 2019-10-24 NOTE — Progress Notes (Signed)
Pharmacy Antibiotic Note  Jeffrey [name] is a 78 y.o. male presenting on 10/22/2019 with foot wound infection, questionable osteomyelitis. Pharmacy has been consulted for vancomycin dosing. Patient received loading dose of vancomycin 11/25 at night but then left AMA (discharged with Augmentin and doxy, did not take any doses today). 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   Today, pre-HD vancomycin level was 21, at goal (15-25). Patient is afebrile. WBC count stable ~12. Day #4 of antimicrobial therapy.   11/26 BCx x2: NG <24hr  Vancomycin 11/25 >> Ceftriaxone 11/26>> Cefepime x1 11/25   Plan: - Schedule maintenance dose of 1000 mg IV qHD MTThS - Obtain pre-HD vancomycin levels as indicated; this patient may require reduced dose based on issues with tolerating UF - Ceftriaxone 2g IV q24hr per MD - F/u HD tolerance, c/s, antibiotic length of therapy   Height: 5\' 11"  (180.3 cm) Weight: 220 lb 0.3 oz (99.8 kg) IBW/kg (Calculated) : 75.3  Temp (24hrs), Avg:97.6 F (36.4 C), Min:97.3 F (36.3 C), Max:98.2 F (36.8 C)  Recent Labs  Lab 10/21/19 1543 10/21/19 1544 10/22/19 1931 10/22/19 2158 10/23/19 0347 10/24/19 0209  WBC 10.9*  --  12.5*  --  12.8* 12.7*  CREATININE 4.19*  --  4.09*  --  4.18* 4.89*  LATICACIDVEN  --  1.8 2.1* 1.0  --   --   VANCORANDOM  --   --   --   --   --  21    Estimated Creatinine Clearance: 14.5 mL/min (A) (by C-G formula based on SCr of 4.89 mg/dL (H)).    Allergies  Allergen Reactions  . Byetta 10 Mcg Pen [Exenatide] Diarrhea and Nausea And Vomiting  . Telmisartan Other (See Comments)    Unknown reaction - reported by [facility]  . Codeine Itching    Minor reaction  . Coumadin [Warfarin Sodium] Rash    (wife states coumadin was stopped but rash did not disappear 02/21/19)    Agnes Lawrence, PharmD PGY1 Pharmacy Resident

## 2019-10-24 NOTE — Progress Notes (Addendum)
2115 Notified provider that pt blood sugar is 65, will administer D50 amp and hold Levimir at this time and recheck blood sugar in 15 minutes.  2145 Blood sugar recheck 114. Will cont to monitor, call bell within reach.  2212 Pt c/o pain to RLE, Dilaudid 0.5mg  IVP given at this time, B/P 94/62, will cont to monitor.  0338 Blood glucose recheck-113, resting quietly in bed with no acute distress noted, will cont to monitor, call bell within reach.

## 2019-10-24 NOTE — Progress Notes (Signed)
ANTICOAGULATION CONSULT NOTE - Follow Up Consult  Pharmacy Consult for Heparin Indication: atrial fibrillation  Allergies  Allergen Reactions  . Byetta 10 Mcg Pen [Exenatide] Diarrhea and Nausea And Vomiting  . Telmisartan Other (See Comments)    Unknown reaction - reported by [facility]  . Codeine Itching    Minor reaction  . Coumadin [Warfarin Sodium] Rash    (wife states coumadin was stopped but rash did not disappear 02/21/19)    Patient Measurements: Height: 5\' 11"  (180.3 cm) Weight: 219 lb 2.2 oz (99.4 kg) IBW/kg (Calculated) : 75.3 Heparin Dosing Weight:    Vital Signs: Temp: 98.1 F (36.7 C) (11/28 1243) Temp Source: Oral (11/28 1243) BP: 86/55 (11/28 1606) Pulse Rate: 54 (11/28 1151)  Labs: Recent Labs    10/22/19 1931 10/23/19 0347 10/23/19 1553  10/24/19 0209 10/24/19 0848 10/24/19 1801  HGB 10.6* 10.4*  --   --  11.7*  --   --   HCT 32.1* 31.6*  --   --  36.1*  --   --   PLT 191 150  --   --  191  --   --   APTT 53*  --  >200*   < > 122* 188* 107*  LABPROT 32.3*  --   --   --   --   --   --   INR 3.2*  --   --   --   --   --   --   HEPARINUNFRC  --   --  >2.20*  --  >2.20*  --   --   CREATININE 4.09* 4.18*  --   --  4.89*  --   --    < > = values in this interval not displayed.    Estimated Creatinine Clearance: 14.5 mL/min (A) (by C-G formula based on SCr of 4.89 mg/dL (H)).   Assessment: 78 y/o M on Eliquis PTA for atrial fibrillation. Last dose 11/26. Pharmacy has been consulted for heparin dosing in preparation of amputation surgery on Monday.   APTT this evening still remain supratherapeutic at 107, on 800 units/hr. No s/sx of bleeding or infusion issues. Heparin is infusing in R forearm and level was drawn from R hand due to restricted access.  Goal of Therapy:  aPTT 66-102 seconds Monitor platelets by anticoagulation protocol: Yes   Plan:  - Decrease heparin rate to 700 units/hr  - Obtain daily anti-Xa level, aPTT, and CBC while on  heparin - Continue to monitor aPTT until anti-Xa and aPTT levels correlate - Monitor for signs and symptoms of bleeding  Antonietta Jewel, PharmD, BCCCP Clinical Pharmacist  Phone: [phone]  Please check AMION for all [place] phone numbers After 10:00 PM, call [place] [phone]

## 2019-10-24 NOTE — Progress Notes (Signed)
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  Initially given Vanco/cefepime in ED but declined admission to go home for Thanksgiving, went for HD on Thursday and return to ED on 11/26 for further management.  Nephrology consulted for HD and vascular surgery consulted.  A & P   Principal Problem:   Osteomyelitis of right foot (HCC) Active Problems:   ESRD (end stage renal disease) on dialysis [facility])   Atherosclerosis of native coronary artery of native heart with angina pectoris (HCC)   Chronic combined systolic and diastolic CHF (congestive heart failure) (HCC)   Hyperlipidemia   COPD (chronic obstructive pulmonary disease) (HCC)   Chronic respiratory failure with hypoxia (HCC)   Chronic hypotension   Complete heart block (HCC)   Presence of permanent cardiac pacemaker   PVD (peripheral vascular disease) (HCC)   Unstageable pressure ulcer of sacral region ([place])   Suspected osteomyelitis of the second distal phalanx of right foot with overlying cellulitis Follows with Dr. Scot Dock, VVS-vascular surgery No MRI as patient has PPM Leukocytosis stable -Continue vancomycin/ceftriaxone -Vascular surgery consulted: Recommending  amputation on Monday however patient is currently hoping to keep it is leg.  Patient is to discuss with his wife when she comes in the hospital tomorrow.  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 APTT elevated -Hold Eliquis -Continue Heparin bridge to procedure, pharmacy adjusting -Telemetry   COPD with chronic hypoxic respiratory failure on 3 L nasal cannula at baseline -Titrate oxygen as needed -Continue nebulizers  Sacral Pressure ulcer noted by nursing on presentation -Wound care  Diabetes with hypoglycemia in setting of insulin and renal insufficiency HA1C 6.8 Glucose 60 this a.m., received dextrose x1 -Decrease Levemir to 10 units at nighttime and continue sliding scale  CAD status post PCI Stable -Hold Lopressor and Plavix for now  Hyperlipidemia Stable -Continue atorvastatin  Anxiety -Continue as needed Xanax  DVT prophylaxis: Heparin drip Diet: Renal, carb modified, fluid restriction Family Communication: No family at bedside Disposition Plan: Pending clinical stability  Consultants  Vascular surgery Nephrology  Procedures  None  Antibiotics   Anti-infectives (From admission, onward)   Start     Dose/Rate Route Frequency Ordered Stop   10/26/19 1200  vancomycin (VANCOCIN) IVPB 1000 mg/200 mL premix     1,000 mg 200 mL/hr over 60 Minutes Intravenous Every Mon (Hemodialysis) 10/24/19 0721     10/24/19 1200  vancomycin (  VANCOCIN) IVPB 1000 mg/200 mL premix     1,000 mg 200 mL/hr over 60 Minutes Intravenous Every T-Th-Sa (Hemodialysis) 10/24/19 0721     10/23/19 1800  cefTRIAXone  (ROCEPHIN) 2 g in sodium chloride 0.9 % 100 mL IVPB     2 g 200 mL/hr over 30 Minutes Intravenous Every 24 hours 10/22/19 2323     10/23/19 0000  vancomycin variable dose per unstable renal function (pharmacist dosing)      Does not apply See admin instructions 10/22/19 1958     10/22/19 1945  vancomycin (VANCOCIN) IVPB 1000 mg/200 mL premix     1,000 mg 200 mL/hr over 60 Minutes Intravenous  Once 10/22/19 1931 10/22/19 2250   10/22/19 1945  cefTRIAXone (ROCEPHIN) 2 g in sodium chloride 0.9 % 100 mL IVPB     2 g 200 mL/hr over 30 Minutes Intravenous  Once 10/22/19 1931 10/22/19 2138           Subjective   Patient seen in HD.  States he feels well today and slept well.  No complaints at this time.  Objective   Vitals:   10/24/19 0930 10/24/19 1000 10/24/19 1151 10/24/19 1243  BP: (!) 93/40 (!) 90/48 (!) 93/49 (!) 87/49  Pulse: (!) 52 (!) 55 (!) 54   Resp:   16 20  Temp:   98.4 F (36.9 C) 98.1 F (36.7 C)  TempSrc:   Oral Oral  SpO2:    95%  Weight:   99.4 kg   Height:        Intake/Output Summary (Last 24 hours) at 10/24/2019 1320 Last data filed at 10/23/2019 2218 Gross per 24 hour  Intake 243.41 ml  Output --  Net 243.41 ml   Filed Weights   10/24/19 0700 10/24/19 0828 10/24/19 1151  Weight: 99.8 kg 100.8 kg 99.4 kg    Examination:  Physical Exam Vitals signs and nursing note reviewed.  Constitutional:      Appearance: Normal appearance.  HENT:     Head: Normocephalic and atraumatic.     Nose: Nose normal.     Mouth/Throat:     Mouth: Mucous membranes are moist.  Eyes:     Extraocular Movements: Extraocular movements intact.  Neck:     Musculoskeletal: Normal range of motion. No neck rigidity.  Cardiovascular:     Rate and Rhythm: Normal rate and regular rhythm.  Pulmonary:     Effort: Pulmonary effort is normal.     Breath sounds: Normal breath sounds.  Abdominal:     General: Abdomen is flat.     Palpations: Abdomen is soft.   Musculoskeletal:     Comments: Status post left lower extremity amputation Right foot with gangrenous changes on second digit, erythematous skin.  Nontender  Neurological:     Mental Status: He is alert. Mental status is at baseline.  Psychiatric:        Mood and Affect: Mood normal.        Behavior: Behavior normal.     Data Reviewed: I have personally reviewed following labs and imaging studies  CBC: Recent Labs  Lab 10/21/19 1543 10/22/19 1931 10/23/19 0347 10/24/19 0209  WBC 10.9* 12.5* 12.8* 12.7*  NEUTROABS 8.4* 9.8*  --   --   HGB 11.7* 10.6* 10.4* 11.7*  HCT 36.5* 32.1* 31.6* 36.1*  MCV 94.8 91.7 90.8 93.5  PLT 182 191 150 [id number]   Basic Metabolic Panel: Recent Labs  Lab 10/21/19 1543 10/22/19 1931 10/23/19 0347 10/24/19 [id number]  NA 133* 132* 134* 136  K 4.3 4.1 4.0 5.1  CL 92* 94* 95* 94*  CO2 27 27 27 24   GLUCOSE 127* 150* 149* 60*  BUN 25* 27* 29* 35*  CREATININE 4.19* 4.09* 4.18* 4.89*  CALCIUM 8.0* 7.9* 7.7* 8.4*  PHOS  --   --  4.3  --    GFR: Estimated Creatinine Clearance: 14.5 mL/min (A) (by C-G formula based on SCr of 4.89 mg/dL (H)). Liver Function Tests: Recent Labs  Lab 10/21/19 1543 10/22/19 1931 10/23/19 0347  AST 35 39  --   ALT 15 17  --   ALKPHOS 146* 126  --   BILITOT 2.9* 2.3*  --   PROT 6.5 5.4*  --   ALBUMIN 2.4* 1.9* 1.8*   No results for input(s): LIPASE, AMYLASE in the last 168 hours. No results for input(s): AMMONIA in the last 168 hours. Coagulation Profile: Recent Labs  Lab 10/21/19 1543 10/22/19 1931  INR 2.3* 3.2*   Cardiac Enzymes: No results for input(s): CKTOTAL, CKMB, CKMBINDEX, TROPONINI in the last 168 hours. BNP (last 3 results) No results for input(s): PROBNP in the last 8760 hours. HbA1C: Recent Labs    10/22/19 1931  HGBA1C 6.8*   CBG: Recent Labs  Lab 10/23/19 0029 10/23/19 0755 10/24/19 1255  GLUCAP 149* 136* 93   Lipid Profile: No results for input(s): CHOL, HDL, LDLCALC, TRIG,  CHOLHDL, LDLDIRECT in the last 72 hours. Thyroid Function Tests: Recent Labs    10/24/19 0209  TSH 4.329   Anemia Panel: No results for input(s): VITAMINB12, FOLATE, FERRITIN, TIBC, IRON, RETICCTPCT in the last 72 hours. Sepsis Labs: Recent Labs  Lab 10/21/19 1544 10/22/19 1931 10/22/19 2158  LATICACIDVEN 1.8 2.1* 1.0    Recent Results (from the past 240 hour(s))  Culture, blood (Routine x 2)     Status: None (Preliminary result)   Collection Time: 10/21/19  4:08 PM   Specimen: BLOOD RIGHT ARM  Result Value Ref Range Status   Specimen Description BLOOD RIGHT ARM UPPER  Final   Special Requests   Final    BOTTLES DRAWN AEROBIC ONLY Blood Culture adequate volume   Culture   Final    NO GROWTH 2 DAYS Performed at [facility], [place] [address] [place], [place] [address]    Report Status PENDING  Incomplete  Blood Culture (routine x 2)     Status: None (Preliminary result)   Collection Time: 10/22/19  8:36 PM   Specimen: BLOOD RIGHT WRIST  Result Value Ref Range Status   Specimen Description BLOOD RIGHT WRIST  Final   Special Requests   Final    BOTTLES DRAWN AEROBIC AND ANAEROBIC Blood Culture adequate volume   Culture   Final    NO GROWTH < 24 HOURS Performed at [facility], [place] [address], [place], [place] [address]    Report Status PENDING  Incomplete  Culture, blood (Routine x 2)     Status: None (Preliminary result)   Collection Time: 10/22/19  9:58 PM   Specimen: BLOOD RIGHT HAND  Result Value Ref Range Status   Specimen Description BLOOD RIGHT HAND POST VANC/ROCEPHIN  Final   Special Requests   Final    BOTTLES DRAWN AEROBIC AND ANAEROBIC Blood Culture adequate volume   Culture   Final    NO GROWTH < 24 HOURS Performed at [facility], [place] [address] [place], [place] [address]    Report Status PENDING  Incomplete  SARS CORONAVIRUS 2 (TAT 6-24 HRS) Nasopharyngeal Nasopharyngeal Swab     Status: None   Collection Time: 10/22/19 10:55 PM    Specimen: Nasopharyngeal Swab  Result Value Ref Range Status   SARS Coronavirus 2 NEGATIVE NEGATIVE Final    Comment: (NOTE) SARS-CoV-2 target nucleic acids are NOT DETECTED. The SARS-CoV-2 RNA is generally detectable in upper and lower respiratory specimens during the acute phase of infection. Negative results do not preclude SARS-CoV-2 infection, do not rule out co-infections with other pathogens, and should not be used as the sole basis for treatment or other patient management decisions. Negative results must be combined with clinical observations, patient history, and epidemiological information. The expected result is Negative. Fact Sheet for Patients: SugarRoll.be Fact Sheet for Healthcare Providers: https://www.woods-mathews.com/ This test is not yet approved or cleared by the [place] FDA and  has been authorized for detection and/or diagnosis of SARS-CoV-2 by FDA under an Emergency Use Authorization (EUA). This EUA will remain  in effect (meaning this test can be used) for the duration of the COVID-19 declaration under Section 56 4(b)(1) of the Act, 21 U.S.C. section 360bbb-3(b)(1), unless the authorization is terminated or revoked sooner. Performed at [facility], [place] [address] [place], [place] [address]          Radiology Studies: Dg Chest Port 1 View  Result Date: 10/22/2019 CLINICAL DATA:  End-stage renal disease with hypertension. EXAM: PORTABLE CHEST 1 VIEW COMPARISON:  09/08/2019 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 size remains enlarged. Aortic calcifications are noted. The lung bases are only partially visualized, however there appears to be a right-sided pleural effusion with a possible right lower lobe airspace opacity. The lung apices are suboptimally evaluated secondary to patient positioning. There is no pneumothorax. The patient is status post prior TAVR.  IMPRESSION: 1. Limited study as detailed above. 2. Probable right-sided pleural effusion. There is a right lower lung zone airspace opacity which may represent atelectasis or infiltrate. 3. Persistent cardiomegaly. Electronically Signed   By: Constance Holster M.D.   On: 10/22/2019 19:45        Scheduled Meds:  atorvastatin  80 mg Oral q1800   Chlorhexidine Gluconate Cloth  6 each Topical Q0600   cinacalcet  30 mg Oral Once per day on Mon Tue Thu Sat   dextrose  1 ampule Intravenous Once   insulin aspart  0-6 Units Subcutaneous TID WC   insulin detemir  10 Units Subcutaneous QHS   midodrine  10 mg Oral Once per day on Mon Tue Thu Sat   midodrine  2.5 mg Oral Once per day on Mon Tue Thu Sat   [START ON 10/25/2019] midodrine  5 mg Oral 3 times per day on Sun Wed Fri   mometasone-formoterol  2 puff Inhalation BID   sevelamer carbonate  3,200 mg Oral TID WC   sodium chloride flush  3 mL Intravenous Q12H   vancomycin variable dose per unstable renal function (pharmacist dosing)   Does not apply See admin instructions   Continuous Infusions:  cefTRIAXone (ROCEPHIN)  IV 2 g (10/23/19 1725)   heparin 800 Units/hr (10/24/19 1000)   vancomycin     [START ON 10/26/2019] vancomycin       LOS: 2 days    Time spent: 25 minutes with over 50% of the time coordinating the patient's care    Harold Hedge, DO [facility] Pager [phone]  If 7PM-7AM, please contact night-coverage [email or web address] Password  [email or web address] 10/24/2019, 1:20 PM

## 2019-10-24 NOTE — Progress Notes (Signed)
ANTICOAGULATION CONSULT NOTE - Follow Up Consult  Pharmacy Consult for Heparin Indication: atrial fibrillation  Allergies  Allergen Reactions  . Byetta 10 Mcg Pen [Exenatide] Diarrhea and Nausea And Vomiting  . Telmisartan Other (See Comments)    Unknown reaction - reported by [facility]  . Codeine Itching    Minor reaction  . Coumadin [Warfarin Sodium] Rash    (wife states coumadin was stopped but rash did not disappear 02/21/19)    Patient Measurements: Height: 5\' 11"  (180.3 cm) Weight: 222 lb 3.6 oz (100.8 kg) IBW/kg (Calculated) : 75.3 Heparin Dosing Weight:    Vital Signs: Temp: 97.9 F (36.6 C) (11/28 0828) Temp Source: Oral (11/28 0828) BP: 90/48 (11/28 1000) Pulse Rate: 55 (11/28 1000)  Labs: Recent Labs    10/21/19 1543  10/22/19 1931 10/23/19 0347 10/23/19 1553 10/23/19 1846 10/24/19 0209 10/24/19 0848  HGB 11.7*  --  10.6* 10.4*  --   --  11.7*  --   HCT 36.5*  --  32.1* 31.6*  --   --  36.1*  --   PLT 182  --  191 150  --   --  191  --   APTT  --    < > 53*  --  >200* 81* 122* 188*  LABPROT 25.2*  --  32.3*  --   --   --   --   --   INR 2.3*  --  3.2*  --   --   --   --   --   HEPARINUNFRC  --   --   --   --  >2.20*  --  >2.20*  --   CREATININE 4.19*  --  4.09* 4.18*  --   --  4.89*  --    < > = values in this interval not displayed.    Estimated Creatinine Clearance: 14.6 mL/min (A) (by C-G formula based on SCr of 4.89 mg/dL (H)).   Assessment: 78 y/o M on Eliquis PTA for atrial fibrillation. Last dose 11/26. Pharmacy has been consulted for heparin dosing in preparation of amputation surgery on Monday. APTT this morning was supratherapeutic at 188 on 1100 units/hr (drawn ~4 hours early after decreasing rate from previous supratherapeutic aPTT of 122).   Hgb and platelets have been stable. Verified with the RN that aPTT was not drawn from the same place heparin was infusing. No signs or symptoms of bleeding per the RN.   Goal of Therapy:  aPTT  66-102 seconds Monitor platelets by anticoagulation protocol: Yes   Plan:  - Decrease heparin rate from 1100 units/hr to 800 units/hr  - Check aPTT in 8 hours at 1800  - Obtain daily anti-Xa level, aPTT, and CBC while on heparin - Continue to monitor aPTT until anti-Xa and aPTT levels correlate - Monitor for signs and symptoms of bleeding  Agnes Lawrence, PharmD PGY1 Pharmacy Resident

## 2019-10-25 LAB — GLUCOSE, CAPILLARY
Glucose-Capillary: 113 mg/dL — ABNORMAL HIGH (ref 70–99)
Glucose-Capillary: 92 mg/dL (ref 70–99)
Glucose-Capillary: 111 mg/dL — ABNORMAL HIGH (ref 70–99)
Glucose-Capillary: 161 mg/dL — ABNORMAL HIGH (ref 70–99)
Glucose-Capillary: 136 mg/dL — ABNORMAL HIGH (ref 70–99)

## 2019-10-25 LAB — CBC
HCT: 34.6 % — ABNORMAL LOW (ref 39.0–52.0)
Hemoglobin: 11.3 g/dL — ABNORMAL LOW (ref 13.0–17.0)
MCH: 30.1 pg (ref 26.0–34.0)
MCHC: 32.7 g/dL (ref 30.0–36.0)
MCV: 92.3 fL (ref 80.0–100.0)
Platelets: 190 10*3/uL (ref 150–400)
RBC: 3.75 MIL/uL — ABNORMAL LOW (ref 4.22–5.81)
RDW: 18.3 % — ABNORMAL HIGH (ref 11.5–15.5)
WBC: 11.4 10*3/uL — ABNORMAL HIGH (ref 4.0–10.5)
nRBC: 0 % (ref 0.0–0.2)

## 2019-10-25 LAB — HEPARIN LEVEL (UNFRACTIONATED): Heparin Unfractionated: 1 IU/mL — ABNORMAL HIGH (ref 0.30–0.70)

## 2019-10-25 LAB — APTT
aPTT: 47 seconds — ABNORMAL HIGH (ref 24–36)
aPTT: 66 seconds — ABNORMAL HIGH (ref 24–36)

## 2019-10-25 MED ORDER — CHLORHEXIDINE GLUCONATE CLOTH 2 % EX PADS
6.0000 | MEDICATED_PAD | Freq: Every day | CUTANEOUS | Status: DC
  Administered 2019-10-25 – 2019-10-28 (×3): 6 via TOPICAL

## 2019-10-25 NOTE — Progress Notes (Addendum)
ANTICOAGULATION CONSULT NOTE - Follow Up Consult  Pharmacy Consult for Heparin Indication: atrial fibrillation  Allergies  Allergen Reactions  . Byetta 10 Mcg Pen [Exenatide] Diarrhea and Nausea And Vomiting  . Telmisartan Other (See Comments)    Unknown reaction - reported by [facility]  . Codeine Itching    Minor reaction  . Coumadin [Warfarin Sodium] Rash    (wife states coumadin was stopped but rash did not disappear 02/21/19)    Patient Measurements: Height: 5\' 11"  (180.3 cm) Weight: 220 lb 3.8 oz (99.9 kg) IBW/kg (Calculated) : 75.3 Heparin Dosing Weight:    Vital Signs: Temp: 98.2 F (36.8 C) (11/29 0338) Temp Source: Axillary (11/29 0338) BP: 104/59 (11/29 0338) Pulse Rate: 58 (11/29 0924)  Labs: Recent Labs    10/22/19 1931 10/23/19 0347 10/23/19 1553  10/24/19 0209 10/24/19 0848 10/24/19 1801  HGB 10.6* 10.4*  --   --  11.7*  --   --   HCT 32.1* 31.6*  --   --  36.1*  --   --   PLT 191 150  --   --  191  --   --   APTT 53*  --  >200*   < > 122* 188* 107*  LABPROT 32.3*  --   --   --   --   --   --   INR 3.2*  --   --   --   --   --   --   HEPARINUNFRC  --   --  >2.20*  --  >2.20*  --   --   CREATININE 4.09* 4.18*  --   --  4.89*  --   --    < > = values in this interval not displayed.    Estimated Creatinine Clearance: 14.5 mL/min (A) (by C-G formula based on SCr of 4.89 mg/dL (H)).   Assessment: 78 y/o M on Eliquis PTA for atrial fibrillation. Last dose 11/26. Pharmacy has been consulted for heparin dosing in preparation of amputation surgery on Monday.   APTT yesterday evening was still supratherapeutic at 107, on 800 units/hr. No s/sx of bleeding or infusion issues. Heparin is infusing in R forearm and level was drawn from R hand due to restricted access. Rate was further decreased to 700 units/hr.   Labs were supposed to be drawn this morning to assess aPTT, anti-Xa level, and CBC, but patient refused. I spoke with Dr. Neysa Bonito who stated he would  like to continue the heparin IV infusion at the current rate and that he would follow up with the patient today to determine the next steps. There are no signs/symptoms of bleeding per the RN. The patient is scheduled to undergo right leg amputation tomorrow morning in which heparin will be held.   12:50 pm: Called nurse again to see about any updates. Dr. Neysa Bonito went into the room and asked if we could get one more stick. Patient agreed, but the phlebotomists/lab have been unreachable to come collect per the [place], Therapist, sports. She turned the heparin infusion off assuming lab would come collect soon. It has been off for approximately 30 minutes. I asked her to turn the heparin infusion back on for now, and I will order a STAT aPTT, anti-Xa, and CBC. Called the phlebotomist [facility]) to ensure they were aware to collect. Levels will likely be off due infusion being temporarily off.   Labs collected 13:37: aPTT 47, CBC stable, HL 1. Likely low d/t infusion being off.   Goal of  Therapy:  aPTT 66-102 seconds Monitor platelets by anticoagulation protocol: Yes   Plan:  - Continue heparin rate 700 units/hr for now - F/u plan to continue heparin per Dr. Neysa Bonito - Try to get aPTT at 2100 - F/u daily anti-Xa level, aPTT, and CBC  - Continue to monitor aPTT until anti-Xa and aPTT levels correlate - Monitor for signs and symptoms of bleeding  Agnes Lawrence, PharmD PGY1 Pharmacy Resident

## 2019-10-25 NOTE — Progress Notes (Addendum)
Patient ID: Jeffrey [name], male   DOB: [birth year], 78 y.o.   MRN: [id number]  Progress Note    10/25/2019 9:01 AM * No surgery date entered *  Subjective: Reports severe pain in his right foot through the night.   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  CBC    Component Value Date/Time   WBC 12.7 (H) 10/24/2019 0209   RBC 3.86 (L) 10/24/2019 0209   HGB 11.7 (L) 10/24/2019 0209   HGB 9.2 (L) 01/07/2019 1108   HCT 36.1 (L) 10/24/2019 0209   HCT 29.6 (L) 01/07/2019 1108   PLT 191 10/24/2019 0209   PLT 211 01/07/2019 1108   MCV 93.5 10/24/2019 0209   MCV 93 01/07/2019 1108   MCH 30.3 10/24/2019 0209   MCHC 32.4 10/24/2019 0209   RDW 18.4 (H) 10/24/2019 0209   RDW 17.3 (H) 01/07/2019 1108   LYMPHSABS 0.9 10/22/2019 1931   LYMPHSABS 1.4 01/07/2019 1108   MONOABS 1.4 (H) 10/22/2019 1931   EOSABS 0.2 10/22/2019 1931   EOSABS 0.1 01/07/2019 1108   BASOSABS 0.1 10/22/2019 1931   BASOSABS 0.0 01/07/2019 1108    BMET    Component Value Date/Time   NA 136 10/24/2019 0209   NA 137 01/07/2019 1108   K 5.1 10/24/2019 0209   CL 94 (L) 10/24/2019 0209   CO2 24 10/24/2019 0209   GLUCOSE 60 (L) 10/24/2019 0209   BUN 35 (H) 10/24/2019 0209   BUN 29 (H) 01/07/2019 1108   CREATININE 4.89 (H) 10/24/2019 0209   CALCIUM 8.4 (L) 10/24/2019 0209   GFRNONAA 10 (L) 10/24/2019 0209   GFRAA 12 (L) 10/24/2019 0209    INR    Component Value Date/Time   INR 3.2 (H) 10/22/2019 1931     Intake/Output Summary (Last 24 hours) at 10/25/2019 0901 Last data filed at 10/24/2019 2133 Gross per 24 hour  Intake 221.27 ml  Output 1877 ml  Net -1655.73 ml     Assessment/Plan:  78 y.o. male patient now willing to accept amputation.  Reports severe pain with some relief with Dilaudid.  Will plan right leg  amputation with Dr. Scot Dock tomorrow. Discussed above-knee versus below-knee.  He reports that he prefers below-knee amputation.  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  Will allow final decision regarding amputation level to be determined between Dr. Scot Dock and the patient tomorrow  I spoke directly with the patient's nurse this morning.  Explained that he will be going for surgery first thing in the morning and cannot go to dialysis first thing in the morning.  She also stated there is been some difficulty with blood draws and his refusal for further sticks.  His nurse will let the hospitalist service know about this issue.    Rosetta Posner, MD FACS [facility] [phone] 10/25/2019 9:01 AM

## 2019-10-25 NOTE — Progress Notes (Signed)
ANTICOAGULATION CONSULT NOTE - Follow Up Consult  Pharmacy Consult for Heparin Indication: atrial fibrillation  Allergies  Allergen Reactions  . Byetta 10 Mcg Pen [Exenatide] Diarrhea and Nausea And Vomiting  . Telmisartan Other (See Comments)    Unknown reaction - reported by [facility]  . Codeine Itching    Minor reaction  . Coumadin [Warfarin Sodium] Rash    (wife states coumadin was stopped but rash did not disappear 02/21/19)    Patient Measurements: Height: 5\' 11"  (180.3 cm) Weight: 220 lb 3.8 oz (99.9 kg) IBW/kg (Calculated) : 75.3 Heparin Dosing Weight:    Vital Signs: Temp: 98.2 F (36.8 C) (11/29 2009)  Labs: Recent Labs    10/23/19 0347 10/23/19 1553  10/24/19 0209  10/24/19 1801 10/25/19 1331 10/25/19 2052  HGB 10.4*  --   --  11.7*  --   --  11.3*  --   HCT 31.6*  --   --  36.1*  --   --  34.6*  --   PLT 150  --   --  191  --   --  190  --   APTT  --  >200*   < > 122*   < > 107* 47* 66*  HEPARINUNFRC  --  >2.20*  --  >2.20*  --   --  1.00*  --   CREATININE 4.18*  --   --  4.89*  --   --   --   --    < > = values in this interval not displayed.    Estimated Creatinine Clearance: 14.5 mL/min (A) (by C-G formula based on SCr of 4.89 mg/dL (H)).   Assessment: 78 y/o M on Eliquis PTA for atrial fibrillation. Last dose 11/26. Pharmacy has been consulted for heparin dosing in preparation of amputation surgery on Monday.   APTT this evening came back therapeutic at 66, on low end of goal. CBC stable. No s/sx of bleeding or infusion issues. Was supratherapeutic at 800 units/hr likely given to infusion site and restricted access.  Goal of Therapy:  aPTT 66-102 seconds Monitor platelets by anticoagulation protocol: Yes   Plan:  - Increase heparin rate slightly to 750 units/hr - F/u plan to continue heparin per Dr. Neysa Bonito - F/u daily anti-Xa level, aPTT, and CBC  - Continue to monitor aPTT until anti-Xa and aPTT levels correlate - Monitor for signs and  symptoms of bleeding  Antonietta Jewel, PharmD, BCCCP Clinical Pharmacist  Phone: [phone]  Please check AMION for all [place] phone numbers After 10:00 PM, call [place] [phone]

## 2019-10-25 NOTE — Progress Notes (Signed)
[facility] Progress Note   Subjective:   Pt seen in room. Plan for surgery in the AM, will schedule HD around this. Almost 3kg above EDW. Denies SOB any different than his usual. He has not tried to lay flat but reports orthopnea at baseline. Denies cough, CP, palpitations, dizziness, abdominal pain, N/V.   Objective Vitals:   10/24/19 2055 10/24/19 2349 10/25/19 0338 10/25/19 0924  BP: 94/62 (!) 96/56 (!) 104/59   Pulse: 60 60 (!) 57 (!) 58  Resp: 20 20 14 11   Temp: 98 F (36.7 C) 98.5 F (36.9 C) 98.2 F (36.8 C)   TempSrc: Oral Oral Axillary   SpO2: 96% 90% 97% 92%  Weight:   99.9 kg   Height:       Physical Exam General: Well developed, chronically ill appearing male. Alert and in NAD Heart: Regular rhythm, slightly bradycardic. No murmurs, rubs or gallops auscultated Lungs: Expiratory wheeze auscultated RLL. No rhonchi or rales auscultated. On O2 via nasal cannula Abdomen: Soft, non-tender, non-distended. +BS Extremities: Erythema extending up to R mid calf with necrotic appearing lesion on second toe. L BKA stump with 1+ edema Dialysis Access: LUE AVF + thrill  Additional Objective Labs: Basic Metabolic Panel: Recent Labs  Lab 10/22/19 1931 10/23/19 0347 10/24/19 0209  NA 132* 134* 136  K 4.1 4.0 5.1  CL 94* 95* 94*  CO2 27 27 24   GLUCOSE 150* 149* 60*  BUN 27* 29* 35*  CREATININE 4.09* 4.18* 4.89*  CALCIUM 7.9* 7.7* 8.4*  PHOS  --  4.3  --    Liver Function Tests: Recent Labs  Lab 10/21/19 1543 10/22/19 1931 10/23/19 0347  AST 35 39  --   ALT 15 17  --   ALKPHOS 146* 126  --   BILITOT 2.9* 2.3*  --   PROT 6.5 5.4*  --   ALBUMIN 2.4* 1.9* 1.8*   CBC: Recent Labs  Lab 10/21/19 1543 10/22/19 1931 10/23/19 0347 10/24/19 0209  WBC 10.9* 12.5* 12.8* 12.7*  NEUTROABS 8.4* 9.8*  --   --   HGB 11.7* 10.6* 10.4* 11.7*  HCT 36.5* 32.1* 31.6* 36.1*  MCV 94.8 91.7 90.8 93.5  PLT 182 191 150 191   Blood Culture    Component Value  Date/Time   SDES BLOOD RIGHT HAND POST VANC/ROCEPHIN 10/22/2019 2158   SPECREQUEST  10/22/2019 2158    BOTTLES DRAWN AEROBIC AND ANAEROBIC Blood Culture adequate volume   CULT  10/22/2019 2158    NO GROWTH 2 DAYS Performed at [facility], [place] [address], [place], [place] [address]    REPTSTATUS PENDING 10/22/2019 2158    CBG: Recent Labs  Lab 10/24/19 1656 10/24/19 2107 10/24/19 2148 10/25/19 0338 10/25/19 0814  GLUCAP 107* 65* 114* 113* 92   Medications: . cefTRIAXone (ROCEPHIN)  IV 2 g (10/24/19 1758)  . heparin 700 Units/hr (10/24/19 1934)  . vancomycin 1,000 mg (10/24/19 1511)  . [START ON 10/26/2019] vancomycin     . atorvastatin  80 mg Oral q1800  . Chlorhexidine Gluconate Cloth  6 each Topical Q0600  . cinacalcet  30 mg Oral Once per day on Mon Tue Thu Sat  . insulin aspart  0-6 Units Subcutaneous TID WC  . insulin detemir  10 Units Subcutaneous QHS  . midodrine  10 mg Oral Once per day on Mon Tue Thu Sat  . midodrine  2.5 mg Oral Once per day on Mon Tue Thu Sat  . midodrine  5  mg Oral 3 times per day on Sun Wed Fri  . mometasone-formoterol  2 puff Inhalation BID  . sevelamer carbonate  3,200 mg Oral TID WC  . sodium chloride flush  3 mL Intravenous Q12H  . vancomycin variable dose per unstable renal function (pharmacist dosing)   Does not apply See admin instructions    [facility] Time: 4 hours, 200NRe, BFR 400, BFR 800, EDW 97kg, 2K/2.25 Ca, UF profile 2 Hectorol 1 mcg IV q HD Mircera 52mcg IV q 2 weeks- last dose 11/24 No heparin  Assessment/Plan: 1. Cellulitis/osteomyelitis L foot:On ceftriaxone and vancomycin. Planned for amputation tomorrow. Per primary/vascular.  2. ESRD:MTTS. Received HD yesterday with net UF about 1.9L. No updated labs today- refused further attempts, hospitalist aware.  3. Hypertension/volume:Chronically hypotensive. Some edema on exam but no increased O2 requirement.  Continue midodrine 2.5 mg PO TID and 10mg  before dialysis. 2.9kg above EDW by weights this AM. UF as tolerated with dialysis.Very important to limit fluids.  4. Anemia:Last hemoglobin 11.7. No ESA indicated.  5. Metabolic bone disease:Corrected calcium 10.2, will hold hectorol. Continue sensipar. Phos 4.3. Continue renvela.  6. Nutrition:Renal/carb modified diet with fluid restrictions.  7. Permanent a. Fib/ heart block:rate controlled/slightly bradycardic. Has pacemaker. Per primary 8. COPD: On O2 3L at baseline. + expiratory wheeze, states he just had a breathing treatment. 9. T2DM: Insulin per primary.   Anice Paganini, PA-C 10/25/2019, 10:09 AM  [facility] Pager: [phone]

## 2019-10-25 NOTE — Progress Notes (Signed)
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  Initially given Vanco/cefepime in ED but declined admission to go home for Thanksgiving, went for HD on Thursday and return to ED on 11/26 for further management.  Nephrology consulted for HD and vascular surgery consulted.  Plan for right lower extremity amputation AKA versus BKA 11/30 with Dr. Scot Dock  A & P   Principal Problem:   Osteomyelitis of right foot ([place]) Active Problems:   ESRD (end stage renal disease) on dialysis [facility])   Atherosclerosis of native coronary artery of native heart with angina pectoris (HCC)   Chronic combined systolic and diastolic CHF (congestive heart failure) (HCC)   Hyperlipidemia   COPD (chronic obstructive pulmonary disease) (HCC)   Chronic respiratory failure with hypoxia (HCC)   Chronic hypotension   Complete heart block (HCC)   Presence of permanent cardiac pacemaker   PVD (peripheral vascular disease) (HCC)   Unstageable pressure ulcer of sacral region ([place])   Suspected osteomyelitis of the second distal phalanx of right foot with overlying cellulitis Follows with Dr. Scot Dock, VVS-vascular surgery No MRI as patient has PPM Leukocytosis  improving -Continue vancomycin/ceftriaxone -Vascular surgery consulted:Plan for right lower extremity amputation AKA versus BKA 11/30 with Dr. Scot Dock -Follow-up cultures -Dilaudid 0.5 mg every 4 hours as needed for severe pain in dialysis patient -PT/OT -Social work -Palliative care consulted as patient has multiple chronic comorbidities now with second amputation.  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  Possibly atelectasis versus or volume overload.  On baseline O2. -Continue nebs  Chronic hypotension with volume overload -Continue midodrine   Atrial fibrillation with bradycardia Complete heart block status post PPM Dynamically stable CHA2DS2-VASc: 7, chronically on Eliquis TSH unremarkable. APTT elevated.  Patient initially refusing labs but agreed after I talked to him -Hold Eliquis -Continue Heparin bridge to procedure, pharmacy adjusting -Telemetry   COPD with chronic hypoxic respiratory failure on 3 L nasal cannula at baseline -Titrate oxygen as needed -Continue nebulizers  Sacral Pressure ulcer noted by nursing on presentation -Wound care  Diabetes with hypoglycemia in setting of insulin and renal insufficiency HA1C 6.8 Glucose 60 this a.m., received dextrose x1 -Decrease Levemir to 10 units at nighttime and continue sliding scale  CAD status post PCI Stable -Hold Lopressor and Plavix for now  Hyperlipidemia Stable -Continue atorvastatin  Anxiety -Continue as needed Xanax  DVT prophylaxis: Heparin drip Diet: Renal, carb modified, fluid restriction Family Communication: Discussed with wife at bedside Disposition Plan: Pending clinical stability.  Will  consult social work since patient lives with his wife and son who take care of  him daily but now that he is having another amputation he will likely need extra help.    Consultants  Vascular surgery Nephrology  Procedures  None  Antibiotics   Anti-infectives (From admission, onward)   Start     Dose/Rate Route Frequency Ordered Stop   10/26/19 1200  vancomycin (VANCOCIN) IVPB 1000 mg/200 mL premix     1,000 mg 200 mL/hr over 60 Minutes Intravenous Every Mon (Hemodialysis) 10/24/19 0721     10/24/19 1200  vancomycin (VANCOCIN) IVPB 1000 mg/200 mL premix     1,000 mg 200 mL/hr over 60 Minutes Intravenous Every T-Th-Sa (Hemodialysis) 10/24/19 0721     10/23/19 1800  cefTRIAXone (ROCEPHIN) 2 g in sodium chloride 0.9 % 100 mL IVPB     2 g 200 mL/hr over 30 Minutes Intravenous Every 24 hours 10/22/19 2323     10/23/19 0000  vancomycin variable dose per unstable renal function (pharmacist dosing)      Does not apply See admin instructions 10/22/19 1958     10/22/19 1945  vancomycin (VANCOCIN) IVPB 1000 mg/200 mL premix     1,000 mg 200 mL/hr over 60 Minutes Intravenous  Once 10/22/19 1931 10/22/19 2250   10/22/19 1945  cefTRIAXone (ROCEPHIN) 2 g in sodium chloride 0.9 % 100 mL IVPB     2 g 200 mL/hr over 30 Minutes Intravenous  Once 10/22/19 1931 10/22/19 2138           Subjective   Patient seen at bedside today.  He is understandably very upset about his upcoming surgery tomorrow but understands the need for the surgery.  He is concerned for his family as his wife and son, whom he lives with, take care of him and help him with ADLs and he is concerned about giving more pressure to them with his second amputation.  He has wondering what his options may be.  I discussed that I will consult social work and PT/OT for further recommendations.  States his pain is well controlled.  Denies any chest pain or shortness of breath, nausea or vomiting.  Denies any other complaints.  Objective   Vitals:   10/24/19 2055 10/24/19 2349 10/25/19 0338 10/25/19 0924  BP:  94/62 (!) 96/56 (!) 104/59   Pulse: 60 60 (!) 57 (!) 58  Resp: 20 20 14 11   Temp: 98 F (36.7 C) 98.5 F (36.9 C) 98.2 F (36.8 C)   TempSrc: Oral Oral Axillary   SpO2: 96% 90% 97% 92%  Weight:   99.9 kg   Height:        Intake/Output Summary (Last 24 hours) at 10/25/2019 1530 Last data filed at 10/24/2019 2133 Gross per 24 hour  Intake 3 ml  Output --  Net 3 ml   Filed Weights   10/24/19 0828 10/24/19 1151 10/25/19 0338  Weight: 100.8 kg 99.4 kg 99.9 kg    Examination:  Physical Exam Vitals signs and nursing note reviewed.  Constitutional:      Appearance: He is obese.     Comments: Chronically ill  HENT:     Head: Normocephalic and atraumatic.  Eyes:     Extraocular Movements: Extraocular movements intact.  Cardiovascular:     Rate and Rhythm: Normal rate and regular rhythm.  Pulmonary:     Effort: Pulmonary effort is normal.     Breath sounds: Normal breath sounds. No wheezing or rales.  Comments: On nasal cannula Abdominal:     General: Abdomen is flat.     Palpations: Abdomen is soft.  Musculoskeletal:     Comments: Left BKA Right lower extremity with erythema, 1+ pitting edema and noted infection as previous  Neurological:     General: No focal deficit present.     Mental Status: He is alert. Mental status is at baseline.  Psychiatric:        Mood and Affect: Mood is depressed.        Behavior: Behavior normal.        Thought Content: Thought content does not include suicidal plan.     Data Reviewed: I have personally reviewed following labs and imaging studies  CBC: Recent Labs  Lab 10/21/19 1543 10/22/19 1931 10/23/19 0347 10/24/19 0209 10/25/19 1331  WBC 10.9* 12.5* 12.8* 12.7* 11.4*  NEUTROABS 8.4* 9.8*  --   --   --   HGB 11.7* 10.6* 10.4* 11.7* 11.3*  HCT 36.5* 32.1* 31.6* 36.1* 34.6*  MCV 94.8 91.7 90.8 93.5 92.3  PLT 182 191 150 191 [id number]   Basic Metabolic Panel: Recent Labs  Lab 10/21/19 1543 10/22/19 1931 10/23/19 0347  10/24/19 0209  NA 133* 132* 134* 136  K 4.3 4.1 4.0 5.1  CL 92* 94* 95* 94*  CO2 27 27 27 24   GLUCOSE 127* 150* 149* 60*  BUN 25* 27* 29* 35*  CREATININE 4.19* 4.09* 4.18* 4.89*  CALCIUM 8.0* 7.9* 7.7* 8.4*  PHOS  --   --  4.3  --    GFR: Estimated Creatinine Clearance: 14.5 mL/min (A) (by C-G formula based on SCr of 4.89 mg/dL (H)). Liver Function Tests: Recent Labs  Lab 10/21/19 1543 10/22/19 1931 10/23/19 0347  AST 35 39  --   ALT 15 17  --   ALKPHOS 146* 126  --   BILITOT 2.9* 2.3*  --   PROT 6.5 5.4*  --   ALBUMIN 2.4* 1.9* 1.8*   No results for input(s): LIPASE, AMYLASE in the last 168 hours. No results for input(s): AMMONIA in the last 168 hours. Coagulation Profile: Recent Labs  Lab 10/21/19 1543 10/22/19 1931  INR 2.3* 3.2*   Cardiac Enzymes: No results for input(s): CKTOTAL, CKMB, CKMBINDEX, TROPONINI in the last 168 hours. BNP (last 3 results) No results for input(s): PROBNP in the last 8760 hours. HbA1C: Recent Labs    10/22/19 1931  HGBA1C 6.8*   CBG: Recent Labs  Lab 10/24/19 2107 10/24/19 2148 10/25/19 0338 10/25/19 0814 10/25/19 1213  GLUCAP 65* 114* 113* 92 111*   Lipid Profile: No results for input(s): CHOL, HDL, LDLCALC, TRIG, CHOLHDL, LDLDIRECT in the last 72 hours. Thyroid Function Tests: Recent Labs    10/24/19 0209  TSH 4.329   Anemia Panel: No results for input(s): VITAMINB12, FOLATE, FERRITIN, TIBC, IRON, RETICCTPCT in the last 72 hours. Sepsis Labs: Recent Labs  Lab 10/21/19 1544 10/22/19 1931 10/22/19 2158  LATICACIDVEN 1.8 2.1* 1.0    Recent Results (from the past 240 hour(s))  Culture, blood (Routine x 2)     Status: None (Preliminary result)   Collection Time: 10/21/19  4:08 PM   Specimen: BLOOD RIGHT ARM  Result Value Ref Range Status   Specimen Description BLOOD RIGHT ARM UPPER  Final   Special Requests   Final    BOTTLES DRAWN AEROBIC ONLY Blood Culture adequate volume   Culture   Final    NO GROWTH 4  DAYS Performed at [facility]  Lab, [address] [place], [place] [address]    Report Status PENDING  Incomplete  Blood Culture (routine x 2)     Status: None (Preliminary result)   Collection Time: 10/22/19  8:36 PM   Specimen: BLOOD RIGHT WRIST  Result Value Ref Range Status   Specimen Description BLOOD RIGHT WRIST  Final   Special Requests   Final    BOTTLES DRAWN AEROBIC AND ANAEROBIC Blood Culture adequate volume   Culture   Final    NO GROWTH 3 DAYS Performed at [facility], [place] [address] [place], [place] [address]    Report Status PENDING  Incomplete  Culture, blood (Routine x 2)     Status: None (Preliminary result)   Collection Time: 10/22/19  9:58 PM   Specimen: BLOOD RIGHT HAND  Result Value Ref Range Status   Specimen Description BLOOD RIGHT HAND POST VANC/ROCEPHIN  Final   Special Requests   Final    BOTTLES DRAWN AEROBIC AND ANAEROBIC Blood Culture adequate volume   Culture   Final    NO GROWTH 3 DAYS Performed at [facility], [place] [address], [place], [place] [address]    Report Status PENDING  Incomplete  SARS CORONAVIRUS 2 (TAT 6-24 HRS) Nasopharyngeal Nasopharyngeal Swab     Status: None   Collection Time: 10/22/19 10:55 PM   Specimen: Nasopharyngeal Swab  Result Value Ref Range Status   SARS Coronavirus 2 NEGATIVE NEGATIVE Final    Comment: (NOTE) SARS-CoV-2 target nucleic acids are NOT DETECTED. The SARS-CoV-2 RNA is generally detectable in upper and lower respiratory specimens during the acute phase of infection. Negative results do not preclude SARS-CoV-2 infection, do not rule out co-infections with other pathogens, and should not be used as the sole basis for treatment or other patient management decisions. Negative results must be combined with clinical observations, patient history, and epidemiological information. The expected result is Negative. Fact Sheet for Patients: SugarRoll.be Fact Sheet  for Healthcare Providers: https://www.woods-mathews.com/ This test is not yet approved or cleared by the [place] FDA and  has been authorized for detection and/or diagnosis of SARS-CoV-2 by FDA under an Emergency Use Authorization (EUA). This EUA will remain  in effect (meaning this test can be used) for the duration of the COVID-19 declaration under Section 56 4(b)(1) of the Act, 21 U.S.C. section 360bbb-3(b)(1), unless the authorization is terminated or revoked sooner. Performed at [facility], [place] [address] [place], [place] [address]          Radiology Studies: No results found.      Scheduled Meds:  atorvastatin  80 mg Oral q1800   Chlorhexidine Gluconate Cloth  6 each Topical Q0600   cinacalcet  30 mg Oral Once per day on Mon Tue Thu Sat   insulin aspart  0-6 Units Subcutaneous TID WC   insulin detemir  10 Units Subcutaneous QHS   midodrine  10 mg Oral Once per day on Mon Tue Thu Sat   midodrine  2.5 mg Oral Once per day on Mon Tue Thu Sat   midodrine  5 mg Oral 3 times per day on Sun Wed Fri   mometasone-formoterol  2 puff Inhalation BID   sevelamer carbonate  3,200 mg Oral TID WC   sodium chloride flush  3 mL Intravenous Q12H   vancomycin variable dose per unstable renal function (pharmacist dosing)   Does not apply See admin instructions   Continuous Infusions:  cefTRIAXone (ROCEPHIN)  IV  2 g (10/24/19 1758)   heparin 700 Units/hr (10/25/19 1306)   vancomycin 1,000 mg (10/24/19 1511)   [START ON 10/26/2019] vancomycin       LOS: 3 days    Time spent: 30 minutes with over 50% of the time coordinating the patient's care    Harold Hedge, DO [facility] Pager [phone]  If 7PM-7AM, please contact night-coverage [email or web address] Password [email or web address] 10/25/2019, 3:30 PM

## 2019-10-26 ENCOUNTER — Encounter (HOSPITAL_COMMUNITY)
Admission: EM | Disposition: A | Discharge status: Home Health | Payer: Self-pay | Source: Home / Self Care | Attending: Internal Medicine

## 2019-10-26 ENCOUNTER — Inpatient Hospital Stay (HOSPITAL_COMMUNITY): Payer: Medicare Other | Admitting: Certified Registered"

## 2019-10-26 ENCOUNTER — Encounter: Payer: Medicare Other | Admitting: Internal Medicine

## 2019-10-26 DIAGNOSIS — N186 End stage renal disease: Secondary | ICD-10-CM | POA: Diagnosis not present

## 2019-10-26 DIAGNOSIS — E1129 Type 2 diabetes mellitus with other diabetic kidney complication: Secondary | ICD-10-CM | POA: Diagnosis not present

## 2019-10-26 DIAGNOSIS — Z992 Dependence on renal dialysis: Secondary | ICD-10-CM | POA: Diagnosis not present

## 2019-10-26 HISTORY — PX: AMPUTATION: SHX166

## 2019-10-26 LAB — CBC
HCT: 32 % — ABNORMAL LOW (ref 39.0–52.0)
HCT: 29.9 % — ABNORMAL LOW (ref 39.0–52.0)
Hemoglobin: 10.7 g/dL — ABNORMAL LOW (ref 13.0–17.0)
Hemoglobin: 9.7 g/dL — ABNORMAL LOW (ref 13.0–17.0)
MCH: 30.6 pg (ref 26.0–34.0)
MCH: 30 pg (ref 26.0–34.0)
MCHC: 33.4 g/dL (ref 30.0–36.0)
MCHC: 32.4 g/dL (ref 30.0–36.0)
MCV: 91.4 fL (ref 80.0–100.0)
MCV: 92.6 fL (ref 80.0–100.0)
Platelets: 156 10*3/uL (ref 150–400)
Platelets: 161 10*3/uL (ref 150–400)
RBC: 3.5 MIL/uL — ABNORMAL LOW (ref 4.22–5.81)
RBC: 3.23 MIL/uL — ABNORMAL LOW (ref 4.22–5.81)
RDW: 18.4 % — ABNORMAL HIGH (ref 11.5–15.5)
RDW: 18.3 % — ABNORMAL HIGH (ref 11.5–15.5)
WBC: 12.3 10*3/uL — ABNORMAL HIGH (ref 4.0–10.5)
WBC: 10.3 10*3/uL (ref 4.0–10.5)
nRBC: 0 % (ref 0.0–0.2)
nRBC: 0 % (ref 0.0–0.2)

## 2019-10-26 LAB — PROTIME-INR
INR: 2 — ABNORMAL HIGH (ref 0.8–1.2)
INR: 1.5 — ABNORMAL HIGH (ref 0.8–1.2)
Prothrombin Time: 22.7 seconds — ABNORMAL HIGH (ref 11.4–15.2)
Prothrombin Time: 18.3 seconds — ABNORMAL HIGH (ref 11.4–15.2)

## 2019-10-26 LAB — GLUCOSE, CAPILLARY
Glucose-Capillary: 104 mg/dL — ABNORMAL HIGH (ref 70–99)
Glucose-Capillary: 94 mg/dL (ref 70–99)
Glucose-Capillary: 62 mg/dL — ABNORMAL LOW (ref 70–99)
Glucose-Capillary: 62 mg/dL — ABNORMAL LOW (ref 70–99)
Glucose-Capillary: 65 mg/dL — ABNORMAL LOW (ref 70–99)
Glucose-Capillary: 93 mg/dL (ref 70–99)
Glucose-Capillary: 94 mg/dL (ref 70–99)
Glucose-Capillary: 187 mg/dL — ABNORMAL HIGH (ref 70–99)
Glucose-Capillary: 116 mg/dL — ABNORMAL HIGH (ref 70–99)
Glucose-Capillary: 191 mg/dL — ABNORMAL HIGH (ref 70–99)
Glucose-Capillary: 150 mg/dL — ABNORMAL HIGH (ref 70–99)
Glucose-Capillary: 142 mg/dL — ABNORMAL HIGH (ref 70–99)

## 2019-10-26 LAB — BASIC METABOLIC PANEL
Anion gap: 15 (ref 5–15)
BUN: 30 mg/dL — ABNORMAL HIGH (ref 8–23)
CO2: 25 mmol/L (ref 22–32)
Calcium: 8.1 mg/dL — ABNORMAL LOW (ref 8.9–10.3)
Chloride: 92 mmol/L — ABNORMAL LOW (ref 98–111)
Creatinine, Ser: 4.77 mg/dL — ABNORMAL HIGH (ref 0.61–1.24)
GFR calc Af Amer: 12 mL/min — ABNORMAL LOW (ref >60)
GFR calc non Af Amer: 11 mL/min — ABNORMAL LOW (ref >60)
Glucose, Bld: 122 mg/dL — ABNORMAL HIGH (ref 70–99)
Potassium: 4.1 mmol/L (ref 3.5–5.1)
Sodium: 132 mmol/L — ABNORMAL LOW (ref 135–145)

## 2019-10-26 LAB — TYPE AND SCREEN
ABO/RH(D): O NEG
Antibody Screen: NEGATIVE

## 2019-10-26 LAB — APTT
aPTT: >200 seconds (ref 24–36)
aPTT: 45 seconds — ABNORMAL HIGH (ref 24–36)

## 2019-10-26 LAB — CULTURE, BLOOD (ROUTINE X 2)
Culture: NO GROWTH
Special Requests: ADEQUATE

## 2019-10-26 LAB — MAGNESIUM: Magnesium: 1.5 mg/dL — ABNORMAL LOW (ref 1.7–2.4)

## 2019-10-26 LAB — HEPARIN LEVEL (UNFRACTIONATED): Heparin Unfractionated: 2.2 IU/mL — ABNORMAL HIGH (ref 0.30–0.70)

## 2019-10-26 SURGERY — AMPUTATION, ABOVE KNEE
Anesthesia: General | Site: Knee | Laterality: Right

## 2019-10-26 MED ORDER — SUCCINYLCHOLINE CHLORIDE 20 MG/ML IJ SOLN
INTRAMUSCULAR | Status: DC | PRN
  Administered 2019-10-26: 80 mg via INTRAVENOUS

## 2019-10-26 MED ORDER — ROCURONIUM BROMIDE 50 MG/5ML IV SOSY
PREFILLED_SYRINGE | INTRAVENOUS | Status: DC | PRN
  Administered 2019-10-26: 30 mg via INTRAVENOUS

## 2019-10-26 MED ORDER — BACITRACIN ZINC 500 UNIT/GM EX OINT
TOPICAL_OINTMENT | CUTANEOUS | Status: DC | PRN
  Administered 2019-10-26: 1 via TOPICAL

## 2019-10-26 MED ORDER — EPHEDRINE 5 MG/ML INJ
INTRAVENOUS | Status: AC
  Filled 2019-10-26: qty 10

## 2019-10-26 MED ORDER — PHENYLEPHRINE 40 MCG/ML (10ML) SYRINGE FOR IV PUSH (FOR BLOOD PRESSURE SUPPORT)
PREFILLED_SYRINGE | INTRAVENOUS | Status: DC | PRN
  Administered 2019-10-26: 80 ug via INTRAVENOUS
  Administered 2019-10-26: 120 ug via INTRAVENOUS
  Administered 2019-10-26 (×2): 200 ug via INTRAVENOUS

## 2019-10-26 MED ORDER — DARBEPOETIN ALFA 60 MCG/0.3ML IJ SOSY
60.0000 ug | PREFILLED_SYRINGE | INTRAMUSCULAR | Status: DC
  Administered 2019-10-27: 15:00:00 60 ug via INTRAVENOUS
  Filled 2019-10-26: qty 0.3

## 2019-10-26 MED ORDER — FENTANYL CITRATE (PF) 100 MCG/2ML IJ SOLN
25.0000 ug | INTRAMUSCULAR | Status: DC | PRN
  Administered 2019-10-26: 25 ug via INTRAVENOUS
  Administered 2019-10-26: 50 ug via INTRAVENOUS

## 2019-10-26 MED ORDER — SENNA 8.6 MG PO TABS
1.0000 | ORAL_TABLET | Freq: Every day | ORAL | Status: DC
  Administered 2019-10-26 – 2019-10-30 (×5): 8.6 mg via ORAL
  Filled 2019-10-26 (×5): qty 1

## 2019-10-26 MED ORDER — PROPOFOL 10 MG/ML IV BOLUS
INTRAVENOUS | Status: DC | PRN
  Administered 2019-10-26: 100 mg via INTRAVENOUS

## 2019-10-26 MED ORDER — HYDROMORPHONE HCL 1 MG/ML IJ SOLN
0.5000 mg | Freq: Once | INTRAMUSCULAR | Status: AC
  Administered 2019-10-26: 0.5 mg via INTRAVENOUS
  Filled 2019-10-26: qty 0.5

## 2019-10-26 MED ORDER — SODIUM CHLORIDE 0.9 % IV SOLN
INTRAVENOUS | Status: DC | PRN
  Administered 2019-10-26: 11:00:00 via INTRAVENOUS

## 2019-10-26 MED ORDER — PHENYLEPHRINE 40 MCG/ML (10ML) SYRINGE FOR IV PUSH (FOR BLOOD PRESSURE SUPPORT)
PREFILLED_SYRINGE | INTRAVENOUS | Status: AC
  Filled 2019-10-26: qty 10

## 2019-10-26 MED ORDER — FENTANYL CITRATE (PF) 250 MCG/5ML IJ SOLN
INTRAMUSCULAR | Status: AC
  Filled 2019-10-26: qty 5

## 2019-10-26 MED ORDER — HEPARIN (PORCINE) 25000 UT/250ML-% IV SOLN: 700.0000 [IU]/h | INTRAVENOUS | Status: DC

## 2019-10-26 MED ORDER — SODIUM CHLORIDE 0.9 % IV BOLUS
500.0000 mL | Freq: Once | INTRAVENOUS | Status: AC
  Administered 2019-10-26: 500 mL via INTRAVENOUS

## 2019-10-26 MED ORDER — LIDOCAINE 2% (20 MG/ML) 5 ML SYRINGE
INTRAMUSCULAR | Status: AC
  Filled 2019-10-26: qty 5

## 2019-10-26 MED ORDER — SODIUM CHLORIDE 0.9 % IV BOLUS: 500.0000 mL | Freq: Once | INTRAVENOUS | Status: DC

## 2019-10-26 MED ORDER — FENTANYL CITRATE (PF) 100 MCG/2ML IJ SOLN
INTRAMUSCULAR | Status: DC | PRN
  Administered 2019-10-26: 100 ug via INTRAVENOUS

## 2019-10-26 MED ORDER — ACETAMINOPHEN 500 MG PO TABS
1000.0000 mg | ORAL_TABLET | Freq: Three times a day (TID) | ORAL | Status: DC
  Administered 2019-10-26 – 2019-10-29 (×9): 1000 mg via ORAL
  Filled 2019-10-26 (×9): qty 2

## 2019-10-26 MED ORDER — MIDAZOLAM HCL 2 MG/2ML IJ SOLN
INTRAMUSCULAR | Status: AC
  Filled 2019-10-26: qty 2

## 2019-10-26 MED ORDER — POLYETHYLENE GLYCOL 3350 17 G PO PACK
17.0000 g | PACK | Freq: Every day | ORAL | Status: DC
  Administered 2019-10-26 – 2019-10-30 (×5): 17 g via ORAL
  Filled 2019-10-26 (×5): qty 1

## 2019-10-26 MED ORDER — LIDOCAINE 2% (20 MG/ML) 5 ML SYRINGE
INTRAMUSCULAR | Status: DC | PRN
  Administered 2019-10-26: 60 mg via INTRAVENOUS

## 2019-10-26 MED ORDER — HYDROMORPHONE HCL 1 MG/ML IJ SOLN
INTRAMUSCULAR | Status: AC
  Filled 2019-10-26: qty 1

## 2019-10-26 MED ORDER — SUGAMMADEX SODIUM 200 MG/2ML IV SOLN
INTRAVENOUS | Status: DC | PRN
  Administered 2019-10-26: 200 mg via INTRAVENOUS

## 2019-10-26 MED ORDER — FENTANYL CITRATE (PF) 100 MCG/2ML IJ SOLN
INTRAMUSCULAR | Status: AC
  Filled 2019-10-26: qty 2

## 2019-10-26 MED ORDER — BACITRACIN ZINC 500 UNIT/GM EX OINT
TOPICAL_OINTMENT | CUTANEOUS | Status: AC
  Filled 2019-10-26: qty 28.35

## 2019-10-26 MED ORDER — EPHEDRINE SULFATE-NACL 50-0.9 MG/10ML-% IV SOSY
PREFILLED_SYRINGE | INTRAVENOUS | Status: DC | PRN
  Administered 2019-10-26: 10 mg via INTRAVENOUS
  Administered 2019-10-26 (×2): 15 mg via INTRAVENOUS
  Administered 2019-10-26: 10 mg via INTRAVENOUS

## 2019-10-26 MED ORDER — PROPOFOL 10 MG/ML IV BOLUS
INTRAVENOUS | Status: AC
  Filled 2019-10-26: qty 20

## 2019-10-26 MED ORDER — ROCURONIUM BROMIDE 10 MG/ML (PF) SYRINGE
PREFILLED_SYRINGE | INTRAVENOUS | Status: AC
  Filled 2019-10-26: qty 10

## 2019-10-26 MED ORDER — OXYCODONE HCL 5 MG PO TABS
5.0000 mg | ORAL_TABLET | Freq: Four times a day (QID) | ORAL | Status: DC | PRN
  Administered 2019-10-26 – 2019-10-27 (×2): 5 mg via ORAL
  Filled 2019-10-26 (×2): qty 1

## 2019-10-26 MED ORDER — DEXAMETHASONE SODIUM PHOSPHATE 10 MG/ML IJ SOLN
INTRAMUSCULAR | Status: DC | PRN
  Administered 2019-10-26: 10 mg via INTRAVENOUS

## 2019-10-26 MED ORDER — ONDANSETRON HCL 4 MG/2ML IJ SOLN
INTRAMUSCULAR | Status: DC | PRN
  Administered 2019-10-26: 4 mg via INTRAVENOUS

## 2019-10-26 MED ORDER — OXYCODONE-ACETAMINOPHEN 5-325 MG PO TABS
1.0000 | ORAL_TABLET | ORAL | Status: DC | PRN
  Administered 2019-10-26: 1 via ORAL
  Filled 2019-10-26: qty 1

## 2019-10-26 MED ORDER — MIDODRINE HCL 5 MG PO TABS
10.0000 mg | ORAL_TABLET | Freq: Once | ORAL | Status: AC
  Administered 2019-10-26: 10 mg via ORAL

## 2019-10-26 MED ORDER — ONDANSETRON HCL 4 MG/2ML IJ SOLN: 4.0000 mg | Freq: Once | INTRAMUSCULAR | Status: DC | PRN

## 2019-10-26 SURGICAL SUPPLY — 56 items
BANDAGE ESMARK 6X9 LF (GAUZE/BANDAGES/DRESSINGS) ×1 IMPLANT
BLADE SAW RECIP 87.9 MT (BLADE) ×3 IMPLANT
BNDG CMPR 9X6 STRL LF SNTH (GAUZE/BANDAGES/DRESSINGS) ×1
BNDG COHESIVE 6X5 TAN STRL LF (GAUZE/BANDAGES/DRESSINGS) ×3 IMPLANT
BNDG ELASTIC 4X5.8 VLCR STR LF (GAUZE/BANDAGES/DRESSINGS) ×4 IMPLANT
BNDG ESMARK 6X9 LF (GAUZE/BANDAGES/DRESSINGS) ×3
BNDG GAUZE ELAST 4 BULKY (GAUZE/BANDAGES/DRESSINGS) ×3 IMPLANT
CANISTER SUCT 3000ML PPV (MISCELLANEOUS) ×3 IMPLANT
CLIP VESOCCLUDE MED 6/CT (CLIP) ×4 IMPLANT
COVER SURGICAL LIGHT HANDLE (MISCELLANEOUS) ×3 IMPLANT
COVER WAND RF STERILE (DRAPES) ×3 IMPLANT
CUFF TOURN SGL QUICK 18X4 (TOURNIQUET CUFF) IMPLANT
CUFF TOURN SGL QUICK 24 (TOURNIQUET CUFF) ×3
CUFF TOURN SGL QUICK 34 (TOURNIQUET CUFF)
CUFF TOURN SGL QUICK 42 (TOURNIQUET CUFF) IMPLANT
CUFF TRNQT CYL 24X4X16.5-23 (TOURNIQUET CUFF) IMPLANT
CUFF TRNQT CYL 34X4.125X (TOURNIQUET CUFF) IMPLANT
DRAIN CHANNEL 15F RND FF W/TCR (WOUND CARE) ×2 IMPLANT
DRAIN CHANNEL 19F RND (DRAIN) IMPLANT
DRAPE HALF SHEET 40X57 (DRAPES) ×1 IMPLANT
DRAPE ORTHO SPLIT 77X108 STRL (DRAPES) ×6
DRAPE SURG ORHT 6 SPLT 77X108 (DRAPES) ×2 IMPLANT
DRAPE U-SHAPE 47X51 STRL (DRAPES) ×3 IMPLANT
DRSG ADAPTIC 3X8 NADH LF (GAUZE/BANDAGES/DRESSINGS) ×3 IMPLANT
ELECT CAUTERY BLADE 6.4 (BLADE) ×3 IMPLANT
ELECT REM PT RETURN 9FT ADLT (ELECTROSURGICAL) ×3
ELECTRODE REM PT RTRN 9FT ADLT (ELECTROSURGICAL) ×1 IMPLANT
EVACUATOR SILICONE 100CC (DRAIN) ×2 IMPLANT
GAUZE SPONGE 4X4 12PLY STRL (GAUZE/BANDAGES/DRESSINGS) ×3 IMPLANT
GAUZE SPONGE 4X4 12PLY STRL LF (GAUZE/BANDAGES/DRESSINGS) ×2 IMPLANT
GLOVE BIO SURGEON STRL SZ7.5 (GLOVE) ×3 IMPLANT
GLOVE BIOGEL PI IND STRL 8 (GLOVE) ×1 IMPLANT
GLOVE BIOGEL PI INDICATOR 8 (GLOVE) ×2
GOWN STRL REUS W/ TWL LRG LVL3 (GOWN DISPOSABLE) ×3 IMPLANT
GOWN STRL REUS W/TWL LRG LVL3 (GOWN DISPOSABLE) ×9
KIT BASIN OR (CUSTOM PROCEDURE TRAY) ×3 IMPLANT
KIT TURNOVER KIT B (KITS) ×3 IMPLANT
NS IRRIG 1000ML POUR BTL (IV SOLUTION) ×3 IMPLANT
PACK GENERAL/GYN (CUSTOM PROCEDURE TRAY) ×3 IMPLANT
PAD ARMBOARD 7.5X6 YLW CONV (MISCELLANEOUS) ×6 IMPLANT
RASP HELIOCORDIAL MED (MISCELLANEOUS) IMPLANT
SPONGE LAP 18X18 RF (DISPOSABLE) ×2 IMPLANT
STAPLER VISISTAT (STAPLE) ×3 IMPLANT
STOCKINETTE IMPERVIOUS LG (DRAPES) ×3 IMPLANT
SUT ETHILON 3 0 PS 1 (SUTURE) ×2 IMPLANT
SUT SILK 0 TIES 10X30 (SUTURE) ×3 IMPLANT
SUT SILK 2 0 (SUTURE) ×3
SUT SILK 2 0 SH CR/8 (SUTURE) ×5 IMPLANT
SUT SILK 2-0 18XBRD TIE 12 (SUTURE) ×1 IMPLANT
SUT SILK 3 0 (SUTURE) ×3
SUT SILK 3-0 18XBRD TIE 12 (SUTURE) ×1 IMPLANT
SUT VIC AB 2-0 CT1 18 (SUTURE) ×7 IMPLANT
TOWEL GREEN STERILE (TOWEL DISPOSABLE) ×6 IMPLANT
TOWEL GREEN STERILE FF (TOWEL DISPOSABLE) ×3 IMPLANT
UNDERPAD 30X30 (UNDERPADS AND DIAPERS) ×3 IMPLANT
WATER STERILE IRR 1000ML POUR (IV SOLUTION) ×3 IMPLANT

## 2019-10-26 NOTE — Transfer of Care (Signed)
Immediate Anesthesia Transfer of Care Note  Patient: KABREN PENALVER  Procedure(s) Performed: RIGHT BELOW KNEE AMPUTATION (Right Knee)  Patient Location: PACU  Anesthesia Type:General  Level of Consciousness: drowsy and patient cooperative  Airway & Oxygen Therapy: Patient Spontanous Breathing and Patient connected to nasal cannula oxygen  Post-op Assessment: Report given to RN and Post -op Vital signs reviewed and stable  Post vital signs: Reviewed and stable  Last Vitals:  Vitals Value Taken Time  BP 96/53 10/26/19 1212  Temp    Pulse 67 10/26/19 1213  Resp 22 10/26/19 1213  SpO2 96 % 10/26/19 1213  Vitals shown include unvalidated device data.  Last Pain:  Vitals:   10/26/19 0746  TempSrc: Oral  PainSc:       Patients Stated Pain Goal: 4 ([id number])  Complications: No apparent anesthesia complications

## 2019-10-26 NOTE — Op Note (Signed)
    NAME: Jeffrey [name]    MRN: [id number] DOB: 08-06-1941    DATE OF OPERATION: 10/26/2019  PREOP DIAGNOSIS:    Gangrene right foot  POSTOP DIAGNOSIS:    Same  PROCEDURE:    Right below the knee amputation  SURGEON: Judeth Cornfield. Scot Dock, MD  ASSIST: Laurence Slate, PA  ANESTHESIA: General  EBL: 150 cc  INDICATIONS:    Jeffrey [name] is a 78 y.o. male who presents with a nonhealing gangrenous wound of the right foot and was not a candidate for revascularization.  Amputation was recommended.  He felt strongly about trying below the knee.  I explained there was a 15 to 20% chance of nonhealing however he was willing to proceed.  FINDINGS:   There was good bleeding at this level with no signs of infection.  TECHNIQUE:   The patient was taken to the operating room and received a general anesthetic.  The right leg was prepped and draped in the usual sterile fashion.  Tourniquet had been placed on the upper thigh.  The circumference of the limb was measured 10 cm distal to the tibial tuberosity and two thirds of this distance was used to mark the anterior skin flap.  A long posterior flap of equal length was marked.  The leg was exsanguinated with an Esmarch bandage.  The tourniquet was inflated to 300 mmHg.  Under tourniquet control incision was carried down to the skin, subcutaneous tissue, muscle and fascia.  The tibia and fibula were dissected free circumferentially.  The bone was divided proximal to the level of skin division in the anterior aspect of the tibia was beveled.  The fibula was divided.  The arteries and veins were individually suture ligated with 2-0 silk ties.  The tourniquet was then released.  Additional hemostasis was obtained using electrocautery and 2-0 silk ties.  Hemostasis was obtained in the wound.  The edges of the bone were rasped.  The wound was irrigated.  The fascial layer was closed with interrupted 2-0 Vicryl's.  I did place a 15 Blake drain.  The skin  was closed with staples.  A sterile dressing was applied.  Patient tolerated the procedure well was transferred to recovery room in stable condition.  All needle and sponge counts were correct.  [name], MD, FACS [facility]  DATE OF DICTATION:   10/26/2019

## 2019-10-26 NOTE — Progress Notes (Signed)
Patient's wife is allowed to visit early today in lieu of patient's amputation today. This was discussed with nursing and floor secretary yesterday.   Marva Panda, DO

## 2019-10-26 NOTE — Progress Notes (Addendum)
Palliative note:   Thank you for this consult.  Consult for Palliative medicine was received for "may need outpatient Palliative follow up".  I have reviewed chart and note that patient is currently being followed by [facility] for Palliative medicine.  Discussed with attending MD and will cancel inpatient consult for now.   Please feel free to reconsult if patient declines during admission and [place] discussion is needed.   Mariana Kaufman, AGNP-C Palliative Medicine  Please call Palliative Medicine team phone with any questions [phone]. For individual providers please see AMION.   No charge

## 2019-10-26 NOTE — Progress Notes (Signed)
ANTICOAGULATION CONSULT NOTE - Follow Up Consult  Pharmacy Consult for Heparin Indication: atrial fibrillation  Allergies  Allergen Reactions  . Byetta 10 Mcg Pen [Exenatide] Diarrhea and Nausea And Vomiting  . Telmisartan Other (See Comments)    Unknown reaction - reported by [facility]  . Codeine Itching    Minor reaction  . Coumadin [Warfarin Sodium] Rash    (wife states coumadin was stopped but rash did not disappear 02/21/19)    Patient Measurements: Height: 5\' 11"  (180.3 cm) Weight: 220 lb 3.8 oz (99.9 kg) IBW/kg (Calculated) : 75.3 Heparin Dosing Weight:    Vital Signs: Temp: 97.8 F (36.6 C) (11/30 0746) Temp Source: Oral (11/30 0746) BP: 110/90 (11/30 0746) Pulse Rate: 56 (11/30 0746)  Labs: Recent Labs    10/24/19 0209  10/25/19 1331 10/25/19 2052 10/26/19 0730  HGB 11.7*  --  11.3*  --  10.7*  HCT 36.1*  --  34.6*  --  32.0*  PLT 191  --  190  --  156  APTT 122*   < > 47* 66* >200*  LABPROT  --   --   --   --  22.7*  INR  --   --   --   --  2.0*  HEPARINUNFRC >2.20*  --  1.00*  --  2.20*  CREATININE 4.89*  --   --   --  4.77*   < > = values in this interval not displayed.    Estimated Creatinine Clearance: 14.9 mL/min (A) (by C-G formula based on SCr of 4.77 mg/dL (H)).   Assessment: 78 y/o M on Eliquis PTA for atrial fibrillation. Last dose 11/26. Pharmacy has been consulted 11/27 for heparin dosing in preparation of amputation surgery on Monday 10/26/19.   Today's AM aPTT > 200 on 750 units/hr, drawn at 07:30 prior to going to OR. Supratherapeutic aPTT.  I do not know how/where lab was drawn. I am concerned that it is possible lab draw error, if drawn above where heparin infusing. Previously noted on 11/28 that aPTT was supratherapeutic  (188 sec) at 800 units/hr likely given to infusion site and restricted access. Today's RN unaware of site of lab draw this AM.  No IV issues or  bleeding noted per RN report. Last PM , aPTT was 66 <47 on slightly  lower  rate 700 ut/hr, thus this mornings aPTT possibly lower than resulted value.  Heparin was to be stopped prior to surgery today. Patient now in OR for R BKA vs AKA.   Will follow up post op for anticoagulation plan.   Goal of Therapy:  aPTT 66-102 seconds Monitor platelets by anticoagulation protocol: Yes   Plan:  - Will f/u post op for anticoagulation plan, ? Resume IV heparin or oral Eliquis.  - F/u daily anti-Xa level, aPTT, and CBC if heparin drip restarted post op - Continue to monitor aPTT until anti-Xa and aPTT levels correlate - Monitor for signs and symptoms of bleeding  Nicole Cella, RPh Clinical Pharmacist [phone] Please check AMION for all [place] phone numbers After 10:00 PM, call [place]

## 2019-10-26 NOTE — Progress Notes (Signed)
Report given to oncoming nurse to hold heparin drip due to bloody output from JP drainage and notify surgical team and monitor JP drainage output.

## 2019-10-26 NOTE — Anesthesia Procedure Notes (Signed)
Procedure Name: Intubation Date/Time: 10/26/2019 10:42 AM Performed by: Lance Coon, CRNA Pre-anesthesia Checklist: Patient identified, Emergency Drugs available, Suction available, Patient being monitored and Timeout performed Patient Re-evaluated:Patient Re-evaluated prior to induction Oxygen Delivery Method: Circle System Utilized Preoxygenation: Pre-oxygenation with 100% oxygen Induction Type: IV induction Ventilation: Mask ventilation without difficulty Laryngoscope Size: Miller and 3 Grade View: Grade I Tube type: Oral Tube size: 7.5 mm Number of attempts: 1 Airway Equipment and Method: Stylet and Oral airway Placement Confirmation: ETT inserted through vocal cords under direct vision,  positive ETCO2 and breath sounds checked- equal and bilateral Secured at: 21 cm Tube secured with: Tape Dental Injury: Teeth and Oropharynx as per pre-operative assessment

## 2019-10-26 NOTE — Progress Notes (Signed)
[facility] Progress Note   Subjective: Seen this morning while in route to surgery (R BKA). Denies CP or dyspnea.  Objective Vitals:   10/26/19 0010 10/26/19 0414 10/26/19 0720 10/26/19 0746  BP:    110/90  Pulse:    (!) 56  Resp:    14  Temp: 97.6 F (36.4 C) 97.6 F (36.4 C)  97.8 F (36.6 C)  TempSrc:    Oral  SpO2:   100% 100%  Weight:      Height:       Physical Exam General: Chronically ill appearing man, NAD Heart: RRR; 2/6 murmur Lungs: CTA anteriorly Abdomen: soft, non-tender Extremities: RLE with diffuse erythema; L BKA with + stump edmea Dialysis Access: LUE AVF + thrill  Additional Objective Labs: Basic Metabolic Panel: Recent Labs  Lab 10/23/19 0347 10/24/19 0209 10/26/19 0730  NA 134* 136 132*  K 4.0 5.1 4.1  CL 95* 94* 92*  CO2 27 24 25   GLUCOSE 149* 60* 122*  BUN 29* 35* 30*  CREATININE 4.18* 4.89* 4.77*  CALCIUM 7.7* 8.4* 8.1*  PHOS 4.3  --   --    Liver Function Tests: Recent Labs  Lab 10/21/19 1543 10/22/19 1931 10/23/19 0347  AST 35 39  --   ALT 15 17  --   ALKPHOS 146* 126  --   BILITOT 2.9* 2.3*  --   PROT 6.5 5.4*  --   ALBUMIN 2.4* 1.9* 1.8*   CBC: Recent Labs  Lab 10/21/19 1543 10/22/19 1931 10/23/19 0347 10/24/19 0209 10/25/19 1331 10/26/19 0730  WBC 10.9* 12.5* 12.8* 12.7* 11.4* 12.3*  NEUTROABS 8.4* 9.8*  --   --   --   --   HGB 11.7* 10.6* 10.4* 11.7* 11.3* 10.7*  HCT 36.5* 32.1* 31.6* 36.1* 34.6* 32.0*  MCV 94.8 91.7 90.8 93.5 92.3 91.4  PLT 182 191 150 191 190 156   Blood Culture    Component Value Date/Time   SDES BLOOD RIGHT HAND POST VANC/ROCEPHIN 10/22/2019 2158   SPECREQUEST  10/22/2019 2158    BOTTLES DRAWN AEROBIC AND ANAEROBIC Blood Culture adequate volume   CULT  10/22/2019 2158    NO GROWTH 4 DAYS Performed at [facility], [place] [address] [place], [place] [address]    REPTSTATUS PENDING 10/22/2019 2158   Medications: . [MAR Hold] cefTRIAXone (ROCEPHIN)  IV 2 g  (10/25/19 1652)  . [MAR Hold] vancomycin 1,000 mg (10/24/19 1511)  . [MAR Hold] vancomycin     . [MAR Hold] atorvastatin  80 mg Oral q1800  . [MAR Hold] Chlorhexidine Gluconate Cloth  6 each Topical Q0600  . [MAR Hold] cinacalcet  30 mg Oral Once per day on Mon Tue Thu Sat  . [MAR Hold] insulin aspart  0-6 Units Subcutaneous TID WC  . [MAR Hold] insulin detemir  10 Units Subcutaneous QHS  . [MAR Hold] midodrine  10 mg Oral Once per day on Mon Tue Thu Sat  . [MAR Hold] midodrine  2.5 mg Oral Once per day on Mon Tue Thu Sat  . [MAR Hold] midodrine  5 mg Oral 3 times per day on Sun Wed Fri  . [MAR Hold] mometasone-formoterol  2 puff Inhalation BID  . [MAR Hold] sevelamer carbonate  3,200 mg Oral TID WC  . [MAR Hold] sodium chloride flush  3 mL Intravenous Q12H  . [MAR Hold] vancomycin variable dose per unstable renal function (pharmacist dosing)   Does not apply See admin instructions  Dialysis Orders: [facility]S at [facility] 4 hours, 200NRe, BFR 400, BFR 800, EDW 97kg, 2K/2.25 Ca, UF profile 2, no heparin - Hectorol 1 mcg IV q HD - Mircera 62mcg IV q 2 weeks- last dose 11/24   Assessment/Plan: 1. Cellulitis/osteomyelitis L foot:OnVanc/Ceftriaxone. Vascular surgery taking him for R BKA today. 2. ESRD:Continue HD perMTTS schedule - HD today after surgery if able. No heparin. 3. Hypertension/volume:Chronically hypotensive. Some edema on exam but no increased O2 requirement. Continue midodrine 2.5 mg PO TID and 10mg  before dialysis. 4. Anemia:Hgb 10.7 - anticipating post-op drop. Will order Aranesp 60mg  for tomorrow. 5. Metabolic bone disease:Corrected Ca slightly high, holding hectorol.Continue sensipar.Phos stable, continue home Renvela. 6. Nutrition:Renal/carb modified diet with fluid restrictions.  7. Permanent A-Fib/ heart block:rate controlled/slightly bradycardic. Has pacemaker.  8. COPD: On O2 3L at baseline. 9. T2DM: Insulin per primary.  Veneta Penton, PA-C 10/26/2019, 11:03 AM  [facility] Pager: [phone]

## 2019-10-26 NOTE — Progress Notes (Signed)
ANTICOAGULATION CONSULT NOTE - Follow Up Consult  Pharmacy Consult for Heparin Indication: atrial fibrillation  Allergies  Allergen Reactions  . Byetta 10 Mcg Pen [Exenatide] Diarrhea and Nausea And Vomiting  . Telmisartan Other (See Comments)    Unknown reaction - reported by [facility]  . Codeine Itching    Minor reaction  . Coumadin [Warfarin Sodium] Rash    (wife states coumadin was stopped but rash did not disappear 02/21/19)    Patient Measurements: Height: 5\' 11"  (180.3 cm) Weight: 220 lb 3.8 oz (99.9 kg) IBW/kg (Calculated) : 75.3 Heparin Dosing Weight:    Vital Signs: Temp: 97.6 F (36.4 C) (11/30 1311) Temp Source: Oral (11/30 1311) BP: 94/47 (11/30 1311) Pulse Rate: 66 (11/30 1311)  Labs: Recent Labs    10/24/19 0209  10/25/19 1331 10/25/19 2052 10/26/19 0730  HGB 11.7*  --  11.3*  --  10.7*  HCT 36.1*  --  34.6*  --  32.0*  PLT 191  --  190  --  156  APTT 122*   < > 47* 66* >200*  LABPROT  --   --   --   --  22.7*  INR  --   --   --   --  2.0*  HEPARINUNFRC >2.20*  --  1.00*  --  2.20*  CREATININE 4.89*  --   --   --  4.77*   < > = values in this interval not displayed.    Estimated Creatinine Clearance: 14.9 mL/min (A) (by C-G formula based on SCr of 4.77 mg/dL (H)).   Assessment: 78 y/o M on Eliquis PTA for atrial fibrillation. Last dose 11/26. Pharmacy has been consulted 11/27 for heparin dosing in preparation of amputation surgery on Monday 10/26/19.   Today's AM aPTT > 200 on 750 units/hr, drawn at 07:30 prior to going to OR. Supratherapeutic aPTT.  I do not know how/where lab was drawn. I am concerned that it is possible lab draw error, if drawn above where heparin infusing. Previously noted on 11/28 that aPTT was supratherapeutic  (188 sec) at 800 units/hr likely given to infusion site and restricted access. Today's RN unaware of site of lab draw this AM.  No IV issues or  bleeding noted per RN report. Last PM , aPTT was 66 <47 on slightly  lower  rate 700 ut/hr, thus this mornings aPTT possibly lower than resulted value.  Heparin stopped prior to surgery today.  Patient now s/p R BKA 10/26/19.  Pharmacy consulted post op to restart IV heparin infusion at 20:00 tonight. Will restart IV heparin at lower rate of 700 units/hr.    Goal of Therapy:  aPTT 66-102 seconds Monitor platelets by anticoagulation protocol: Yes   Plan:  - At 20:00 resume IV heparin infusion 700 units/hr  - F/u 8 hour  anti-Xa level, aPTT, and CBC , then daily.  - Continue to monitor aPTT until anti-Xa and aPTT levels correlate - Monitor for signs and symptoms of bleeding  Nicole Cella, RPh Clinical Pharmacist [phone] Please check AMION for all [place] phone numbers After 10:00 PM, call [place]

## 2019-10-26 NOTE — Progress Notes (Addendum)
Patient surgical team on call MD. Jeffrey [name] was notify of bloody JP  drainage of 60 ml after surgery. On call surgical team gave verbal orders for 500 cc bolus, CBC, PT, INR and to hold heparin drip for tonight order place. vitals taken  Temp 97.4, BP 93/34, HR 66, rr 18, oxygen sat 93 % on 4L N/C.

## 2019-10-26 NOTE — Care Management Important Message (Signed)
Important Message  Patient Details  Name: Jeffrey [name] MRN: [id number] Date of Birth: 19-Oct-1941   Medicare Important Message Given:  Yes     [name] [address] 10/26/2019, 3:56 PM

## 2019-10-26 NOTE — Progress Notes (Signed)
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  Initially given Vanco/cefepime in ED but declined admission to go home for Thanksgiving, went for HD on Thursday and return to ED on 11/26 for further management.  Nephrology consulted for HD and vascular surgery consulted.  Status post right BKA 11/30 with Dr. Scot Dock  A & P   Principal Problem:   Osteomyelitis of right foot ([place]) Active Problems:   ESRD (end stage renal disease) on dialysis [facility])   Atherosclerosis of native coronary artery of native heart with angina pectoris ([place])   Chronic combined systolic and diastolic CHF (congestive heart failure) (H[facility])   Hyperlipidemia   COPD (chronic obstructive pulmonary disease) (HCC)   Chronic respiratory failure with hypoxia (H[facility])   Chronic hypotension   Complete heart block (H[facility])   Presence of permanent cardiac pacemaker   PVD (peripheral vascular disease) (HCC)   Unstageable pressure ulcer of sacral region [facility])   Suspected osteomyelitis of the second distal phalanx of right foot with overlying cellulitis, status post right BKA 11/30 with Dr. Scot Dock, POD 0 Completed 4 days Vanco/Zosyn prior to amputation.  With severe postop  pain today -Continue vancomycin/ceftriaxone -Dilaudid 0.5 mg every 4 hours as needed for severe pain in dialysis patient -PT/OT -Social work -Palliative care consulted as patient has multiple chronic comorbidities now with second amputation.  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 -Hold Eliquis, continue heparin for now -Telemetry   COPD with chronic hypoxic respiratory failure on 3 L nasal cannula at baseline -Titrate oxygen as needed -Continue nebulizers  Sacral Pressure ulcer noted by nursing on presentation -Wound care  Diabetes with hypoglycemia in setting of insulin and renal insufficiency HA1C 6.8 Glucose 60 this a.m., received dextrose x1 -Decrease Levemir to 10 units at nighttime and continue sliding scale  CAD status post PCI Stable -Hold Lopressor and Plavix for now  Hyperlipidemia Stable -Continue atorvastatin  Anxiety -Continue as needed Xanax  DVT prophylaxis: Heparin drip Diet: Renal, carb modified, fluid restriction Family Communication: Discussed with wife at bedside Disposition Plan: Pending clinical stability.    Consultants  Vascular surgery Nephrology  Procedures  None  Antibiotics   Anti-infectives (From admission, onward)   Start     Dose/Rate Route Frequency Ordered Stop   10/26/19 1200  vancomycin (VANCOCIN) IVPB 1000 mg/200 mL premix     1,000 mg 200 mL/hr over 60 Minutes Intravenous Every Mon (Hemodialysis) 10/24/19 [id number]  10/24/19 1200  vancomycin (VANCOCIN) IVPB 1000 mg/200 mL premix     1,000 mg 200 mL/hr over 60 Minutes Intravenous Every T-Th-Sa (Hemodialysis)  10/24/19 0721     10/23/19 1800  cefTRIAXone (ROCEPHIN) 2 g in sodium chloride 0.9 % 100 mL IVPB     2 g 200 mL/hr over 30 Minutes Intravenous Every 24 hours 10/22/19 2323     10/23/19 0000  vancomycin variable dose per unstable renal function (pharmacist dosing)      Does not apply See admin instructions 10/22/19 1958     10/22/19 1945  vancomycin (VANCOCIN) IVPB 1000 mg/200 mL premix     1,000 mg 200 mL/hr over 60 Minutes Intravenous  Once 10/22/19 1931 10/22/19 2250   10/22/19 1945  cefTRIAXone (ROCEPHIN) 2 g in sodium chloride 0.9 % 100 mL IVPB     2 g 200 mL/hr over 30 Minutes Intravenous  Once 10/22/19 1931 10/22/19 2138           Subjective   Patient evaluated at bedside post BKA this afternoon.  He is complaining of severe postop pain in his right lower extremity.  Wife at bedside states that he is not going for dialysis today but will likely go tomorrow.  I have not discussed with nephrology as of yet.  Otherwise he denies any other complaints.  Objective   Vitals:   10/26/19 1212 10/26/19 1226 10/26/19 1243 10/26/19 1311  BP: (!) 96/53 (!) 96/49 (!) 94/53 (!) 94/47  Pulse: 69 69 66 66  Resp: 17 18 19 18   Temp: 97.8 F (36.6 C)   97.6 F (36.4 C)  TempSrc:    Oral  SpO2: 95% 98% 100% 95%  Weight:      Height:        Intake/Output Summary (Last 24 hours) at 10/26/2019 1528 Last data filed at 10/26/2019 1246 Gross per 24 hour  Intake 801 ml  Output 205 ml  Net 596 ml   Filed Weights   10/24/19 0828 10/24/19 1151 10/25/19 0338  Weight: 100.8 kg 99.4 kg 99.9 kg    Examination:  Physical Exam Vitals signs and nursing note reviewed.  Constitutional:      Comments: Appears in significant pain  HENT:     Head: Normocephalic and atraumatic.  Cardiovascular:     Rate and Rhythm: Normal rate and regular rhythm.  Pulmonary:     Effort: Pulmonary effort is normal. No respiratory distress.     Breath sounds: Normal breath sounds.  Abdominal:     General:  Abdomen is flat. There is no distension.  Musculoskeletal:     Comments: Status post bilateral lower extremity amputation  Neurological:     Mental Status: He is alert.  Psychiatric:        Attention and Perception: Attention normal.        Mood and Affect: Mood is anxious.     Data Reviewed: I have personally reviewed following labs and imaging studies  CBC: Recent Labs  Lab 10/21/19 1543 10/22/19 1931 10/23/19 0347 10/24/19 0209 10/25/19 1331 10/26/19 0730  WBC 10.9* 12.5* 12.8* 12.7* 11.4* 12.3*  NEUTROABS 8.4* 9.8*  --   --   --   --   HGB 11.7* 10.6* 10.4* 11.7* 11.3* 10.7*  HCT 36.5* 32.1* 31.6* 36.1* 34.6* 32.0*  MCV 94.8 91.7 90.8 93.5 92.3 91.4  PLT 182 191 150 191 190 [id number]   Basic Metabolic Panel: Recent Labs  Lab 10/21/19 1543 10/22/19 1931 10/23/19 0347 10/24/19 0209 10/26/19  0730  NA 133* 132* 134* 136 132*  K 4.3 4.1 4.0 5.1 4.1  CL 92* 94* 95* 94* 92*  CO2 27 27 27 24 25   GLUCOSE 127* 150* 149* 60* 122*  BUN 25* 27* 29* 35* 30*  CREATININE 4.19* 4.09* 4.18* 4.89* 4.77*  CALCIUM 8.0* 7.9* 7.7* 8.4* 8.1*  MG  --   --   --   --  1.5*  PHOS  --   --  4.3  --   --    GFR: Estimated Creatinine Clearance: 14.9 mL/min (A) (by C-G formula based on SCr of 4.77 mg/dL (H)). Liver Function Tests: Recent Labs  Lab 10/21/19 1543 10/22/19 1931 10/23/19 0347  AST 35 39  --   ALT 15 17  --   ALKPHOS 146* 126  --   BILITOT 2.9* 2.3*  --   PROT 6.5 5.4*  --   ALBUMIN 2.4* 1.9* 1.8*   No results for input(s): LIPASE, AMYLASE in the last 168 hours. No results for input(s): AMMONIA in the last 168 hours. Coagulation Profile: Recent Labs  Lab 10/21/19 1543 10/22/19 1931 10/26/19 0730  INR 2.3* 3.2* 2.0*   Cardiac Enzymes: No results for input(s): CKTOTAL, CKMB, CKMBINDEX, TROPONINI in the last 168 hours. BNP (last 3 results) No results for input(s): PROBNP in the last 8760 hours. HbA1C: No results for input(s): HGBA1C in the last 72 hours. CBG:  Recent Labs  Lab 10/25/19 1213 10/25/19 1710 10/25/19 2101 10/26/19 0745 10/26/19 1227  GLUCAP 111* 161* 136* 116* 191*   Lipid Profile: No results for input(s): CHOL, HDL, LDLCALC, TRIG, CHOLHDL, LDLDIRECT in the last 72 hours. Thyroid Function Tests: Recent Labs    10/24/19 0209  TSH 4.329   Anemia Panel: No results for input(s): VITAMINB12, FOLATE, FERRITIN, TIBC, IRON, RETICCTPCT in the last 72 hours. Sepsis Labs: Recent Labs  Lab 10/21/19 1544 10/22/19 1931 10/22/19 2158  LATICACIDVEN 1.8 2.1* 1.0    Recent Results (from the past 240 hour(s))  Culture, blood (Routine x 2)     Status: None   Collection Time: 10/21/19  4:08 PM   Specimen: BLOOD RIGHT ARM  Result Value Ref Range Status   Specimen Description BLOOD RIGHT ARM UPPER  Final   Special Requests   Final    BOTTLES DRAWN AEROBIC ONLY Blood Culture adequate volume   Culture   Final    NO GROWTH 5 DAYS Performed at [facility], [place] [address] [place], [place] [address]    Report Status 10/26/2019 FINAL  Final  Blood Culture (routine x 2)     Status: None (Preliminary result)   Collection Time: 10/22/19  8:36 PM   Specimen: BLOOD RIGHT WRIST  Result Value Ref Range Status   Specimen Description BLOOD RIGHT WRIST  Final   Special Requests   Final    BOTTLES DRAWN AEROBIC AND ANAEROBIC Blood Culture adequate volume   Culture   Final    NO GROWTH 4 DAYS Performed at [facility], [place] [address], [place], [place] [address]    Report Status PENDING  Incomplete  Culture, blood (Routine x 2)     Status: None (Preliminary result)   Collection Time: 10/22/19  9:58 PM   Specimen: BLOOD RIGHT HAND  Result Value Ref Range Status   Specimen Description BLOOD RIGHT HAND POST VANC/ROCEPHIN  Final   Special Requests   Final    BOTTLES DRAWN AEROBIC AND ANAEROBIC Blood Culture adequate volume   Culture  Final    NO GROWTH 4 DAYS Performed at [facility], [place] [address] [place], [place]  [address]    Report Status PENDING  Incomplete  SARS CORONAVIRUS 2 (TAT 6-24 HRS) Nasopharyngeal Nasopharyngeal Swab     Status: None   Collection Time: 10/22/19 10:55 PM   Specimen: Nasopharyngeal Swab  Result Value Ref Range Status   SARS Coronavirus 2 NEGATIVE NEGATIVE Final    Comment: (NOTE) SARS-CoV-2 target nucleic acids are NOT DETECTED. The SARS-CoV-2 RNA is generally detectable in upper and lower respiratory specimens during the acute phase of infection. Negative results do not preclude SARS-CoV-2 infection, do not rule out co-infections with other pathogens, and should not be used as the sole basis for treatment or other patient management decisions. Negative results must be combined with clinical observations, patient history, and epidemiological information. The expected result is Negative. Fact Sheet for Patients: SugarRoll.be Fact Sheet for Healthcare Providers: https://www.woods-mathews.com/ This test is not yet approved or cleared by the [place] FDA and  has been authorized for detection and/or diagnosis of SARS-CoV-2 by FDA under an Emergency Use Authorization (EUA). This EUA will remain  in effect (meaning this test can be used) for the duration of the COVID-19 declaration under Section 56 4(b)(1) of the Act, 21 U.S.C. section 360bbb-3(b)(1), unless the authorization is terminated or revoked sooner. Performed at [facility], [place] [address], [place], [place] [address]          Radiology Studies: No results found.      Scheduled Meds: . atorvastatin  80 mg Oral q1800  . Chlorhexidine Gluconate Cloth  6 each Topical Q0600  . cinacalcet  30 mg Oral Once per day on Mon Tue Thu Sat  . [START ON 10/27/2019] darbepoetin (ARANESP) injection - DIALYSIS  60 mcg Intravenous Q Tue-HD  . insulin aspart  0-6 Units Subcutaneous TID WC  . insulin detemir  10 Units Subcutaneous QHS  . midodrine  10 mg Oral Once per  day on Mon Tue Thu Sat  . midodrine  2.5 mg Oral Once per day on Mon Tue Thu Sat  . midodrine  5 mg Oral 3 times per day on Sun Wed Fri  . mometasone-formoterol  2 puff Inhalation BID  . sevelamer carbonate  3,200 mg Oral TID WC  . sodium chloride flush  3 mL Intravenous Q12H  . vancomycin variable dose per unstable renal function (pharmacist dosing)   Does not apply See admin instructions   Continuous Infusions: . cefTRIAXone (ROCEPHIN)  IV 2 g (10/25/19 1652)  . heparin    . vancomycin 1,000 mg (10/24/19 1511)  . vancomycin       LOS: 4 days    Time spent: 25 minutes with over 50% of the time coordinating the patient's care    Harold Hedge, DO [facility] Pager [phone]  If 7PM-7AM, please contact night-coverage [email or web address] Password [email or web address] 10/26/2019, 3:28 PM

## 2019-10-26 NOTE — Progress Notes (Addendum)
Levemir insulin 10 units held after verification d/t NPO status at 12 midnight and report that pt did not eat well at dinner with upcoming surgery in a.m. Blood glucose 136 at this time.

## 2019-10-26 NOTE — Anesthesia Preprocedure Evaluation (Addendum)
Anesthesia Evaluation  Patient identified by MRN, date of birth, ID band Patient awake    Reviewed: Allergy & Precautions, NPO status , Patient's Chart, lab work & pertinent test results  Airway Mallampati: II  TM Distance: >3 FB Neck ROM: Full    Dental  (+) Edentulous Upper, Edentulous Lower   Pulmonary COPD,  COPD inhaler and oxygen dependent, former smoker,    Pulmonary exam normal breath sounds clear to auscultation       Cardiovascular hypertension, Pt. on home beta blockers + angina + CAD, + Past MI, + Peripheral Vascular Disease and +CHF  Normal cardiovascular exam+ dysrhythmias Atrial Fibrillation + pacemaker + Cardiac Defibrillator + Valvular Problems/Murmurs AS  Rhythm:Regular Rate:Normal  ECG: a-fib, rate 55  ECHO:  1. Left ventricular ejection fraction, by visual estimation, is 25 to 30%. The left ventricle has severely decreased function. Moderately increased left ventricular size. There is no left ventricular hypertrophy. There has been a decline in LVEF  compared to prior study.  2. Multiple segmental abnormalities exist. See findings.  3. Indeterminate diastolic filling due to E-A fusion pattern of LV diastolic filling.  4. Global right ventricle has moderately reduced systolic function.The right ventricular size is moderately enlarged. No increase in right ventricular wall thickness.  5. Left atrial size was moderately dilated.  6. Right atrial size was moderately dilated.  7. Moderate mitral annular calcification.  8. The mitral valve is degenerative. Moderate mitral valve regurgitation.  9. The tricuspid valve is grossly normal. Tricuspid valve regurgitation moderate. 10. Aortic valve mean gradient measures 8.0 mmHg. 11. Aortic valve regurgitation is trivial by color flow Doppler. 12. 29 mm Edwards Sapien 3 (TAVR) in aortic position. No obvious perivalvular leak noted. 13. The pulmonic valve was not well  visualized. Pulmonic valve regurgitation is not visualized by color flow Doppler. 14. Moderately elevated pulmonary artery systolic pressure. 15. The tricuspid regurgitant velocity is 3.15 m/s, and with an assumed right atrial pressure of 15 mmHg, the estimated right ventricular systolic pressure is moderately elevated at 54.7 mmHg. 16. A pacer wire is visualized. 17. The inferior vena cava is dilated in size with <50% respiratory variability, suggesting right atrial pressure of 15 mmHg.   Neuro/Psych Anxiety  Neuromuscular disease CVA, No Residual Symptoms    GI/Hepatic negative GI ROS, Neg liver ROS,   Endo/Other  diabetes, Insulin Dependent  Renal/GU ESRF and DialysisRenal disease     Musculoskeletal  (+) Arthritis ,   Abdominal (+) + obese,   Peds  Hematology  (+) anemia , HLD   Anesthesia Other Findings Day of surgery medications reviewed with the patient.  Reproductive/Obstetrics                            Anesthesia Physical Anesthesia Plan  ASA: IV  Anesthesia Plan: General   Post-op Pain Management:    Induction: Intravenous  PONV Risk Score and Plan: 2 and Ondansetron, Dexamethasone and Treatment may vary due to age or medical condition  Airway Management Planned: Oral ETT  Additional Equipment:   Intra-op Plan:   Post-operative Plan: Possible Post-op intubation/ventilation  Informed Consent: I have reviewed the patients History and Physical, chart, labs and discussed the procedure including the risks, benefits and alternatives for the proposed anesthesia with the patient or authorized representative who has indicated his/her understanding and acceptance.       Plan Discussed with: CRNA  Anesthesia Plan Comments:  Anesthesia Quick Evaluation  

## 2019-10-26 NOTE — Anesthesia Postprocedure Evaluation (Signed)
Anesthesia Post Note  Patient: SAIED BOLDT  Procedure(s) Performed: RIGHT BELOW KNEE AMPUTATION (Right Knee)     Patient location during evaluation: PACU Anesthesia Type: General Level of consciousness: awake Pain management: pain level controlled Vital Signs Assessment: post-procedure vital signs reviewed and stable Respiratory status: spontaneous breathing, nonlabored ventilation, respiratory function stable and patient connected to nasal cannula oxygen Cardiovascular status: blood pressure returned to baseline and stable Postop Assessment: no apparent nausea or vomiting Anesthetic complications: no    Last Vitals:  Vitals:   10/26/19 1243 10/26/19 1311  BP: (!) 94/53 (!) 94/47  Pulse: 66 66  Resp: 19 18  Temp:  36.4 C  SpO2: 100% 95%    Last Pain:  Vitals:   10/26/19 1311  TempSrc: Oral  PainSc:                  Ryan P Ellender

## 2019-10-27 ENCOUNTER — Encounter (HOSPITAL_COMMUNITY): Payer: Self-pay | Admitting: Vascular Surgery

## 2019-10-27 DIAGNOSIS — F329 Major depressive disorder, single episode, unspecified: Secondary | ICD-10-CM

## 2019-10-27 DIAGNOSIS — G8918 Other acute postprocedural pain: Secondary | ICD-10-CM

## 2019-10-27 LAB — BPAM FFP
Blood Product Expiration Date: 202011302359
Blood Product Expiration Date: 202012042359
ISSUE DATE / TIME: 202011300953
ISSUE DATE / TIME: 202011300953
Unit Type and Rh: 6200
Unit Type and Rh: 6200

## 2019-10-27 LAB — GLUCOSE, CAPILLARY
Glucose-Capillary: 188 mg/dL — ABNORMAL HIGH (ref 70–99)
Glucose-Capillary: 178 mg/dL — ABNORMAL HIGH (ref 70–99)
Glucose-Capillary: 142 mg/dL — ABNORMAL HIGH (ref 70–99)
Glucose-Capillary: 130 mg/dL — ABNORMAL HIGH (ref 70–99)

## 2019-10-27 LAB — CBC
HCT: 31 % — ABNORMAL LOW (ref 39.0–52.0)
Hemoglobin: 10.1 g/dL — ABNORMAL LOW (ref 13.0–17.0)
MCH: 30.1 pg (ref 26.0–34.0)
MCHC: 32.6 g/dL (ref 30.0–36.0)
MCV: 92.5 fL (ref 80.0–100.0)
Platelets: 184 10*3/uL (ref 150–400)
RBC: 3.35 MIL/uL — ABNORMAL LOW (ref 4.22–5.81)
RDW: 18.4 % — ABNORMAL HIGH (ref 11.5–15.5)
WBC: 7.6 10*3/uL (ref 4.0–10.5)
nRBC: 0 % (ref 0.0–0.2)

## 2019-10-27 LAB — CULTURE, BLOOD (ROUTINE X 2)
Culture: NO GROWTH
Culture: NO GROWTH
Special Requests: ADEQUATE
Special Requests: ADEQUATE

## 2019-10-27 LAB — BASIC METABOLIC PANEL
Anion gap: 21 — ABNORMAL HIGH (ref 5–15)
BUN: 35 mg/dL — ABNORMAL HIGH (ref 8–23)
CO2: 20 mmol/L — ABNORMAL LOW (ref 22–32)
Calcium: 8.4 mg/dL — ABNORMAL LOW (ref 8.9–10.3)
Chloride: 94 mmol/L — ABNORMAL LOW (ref 98–111)
Creatinine, Ser: 5.22 mg/dL — ABNORMAL HIGH (ref 0.61–1.24)
GFR calc Af Amer: 11 mL/min — ABNORMAL LOW (ref >60)
GFR calc non Af Amer: 10 mL/min — ABNORMAL LOW (ref >60)
Glucose, Bld: 158 mg/dL — ABNORMAL HIGH (ref 70–99)
Potassium: 5 mmol/L (ref 3.5–5.1)
Sodium: 135 mmol/L (ref 135–145)

## 2019-10-27 LAB — PREPARE FRESH FROZEN PLASMA
Unit division: 0
Unit division: 0

## 2019-10-27 MED ORDER — DARBEPOETIN ALFA 60 MCG/0.3ML IJ SOSY
PREFILLED_SYRINGE | INTRAMUSCULAR | Status: AC
  Administered 2019-10-27: 60 ug via INTRAVENOUS
  Filled 2019-10-27: qty 0.3

## 2019-10-27 MED ORDER — CYCLOBENZAPRINE HCL 5 MG PO TABS
5.0000 mg | ORAL_TABLET | Freq: Once | ORAL | Status: DC
  Filled 2019-10-27: qty 1

## 2019-10-27 MED ORDER — PRO-STAT SUGAR FREE PO LIQD
30.0000 mL | Freq: Two times a day (BID) | ORAL | Status: DC
  Administered 2019-10-27 – 2019-10-30 (×7): 30 mL via ORAL
  Filled 2019-10-27 (×6): qty 30

## 2019-10-27 MED ORDER — ALBUMIN HUMAN 25 % IV SOLN
25.0000 g | Freq: Once | INTRAVENOUS | Status: AC
  Administered 2019-10-27: 13:00:00 25 g via INTRAVENOUS

## 2019-10-27 MED ORDER — VANCOMYCIN HCL IN DEXTROSE 1-5 GM/200ML-% IV SOLN
INTRAVENOUS | Status: AC
  Administered 2019-10-27: 1000 mg via INTRAVENOUS
  Filled 2019-10-27: qty 200

## 2019-10-27 MED ORDER — HYDROMORPHONE HCL 1 MG/ML IJ SOLN
INTRAMUSCULAR | Status: AC
  Administered 2019-10-27: 0.5 mg via INTRAVENOUS
  Filled 2019-10-27: qty 0.5

## 2019-10-27 MED ORDER — ALBUMIN HUMAN 25 % IV SOLN
INTRAVENOUS | Status: AC
  Administered 2019-10-27: 25 g via INTRAVENOUS
  Filled 2019-10-27: qty 100

## 2019-10-27 MED ORDER — HYDROMORPHONE HCL 1 MG/ML IJ SOLN
0.5000 mg | INTRAMUSCULAR | Status: DC | PRN
  Administered 2019-10-27 – 2019-10-28 (×3): 0.5 mg via INTRAVENOUS
  Filled 2019-10-27 (×2): qty 0.5

## 2019-10-27 MED ORDER — APIXABAN 2.5 MG PO TABS
2.5000 mg | ORAL_TABLET | Freq: Two times a day (BID) | ORAL | Status: DC
  Administered 2019-10-27 – 2019-10-30 (×6): 2.5 mg via ORAL
  Filled 2019-10-27 (×6): qty 1

## 2019-10-27 MED ORDER — NEPRO/CARBSTEADY PO LIQD
237.0000 mL | Freq: Two times a day (BID) | ORAL | Status: DC
  Administered 2019-10-27: 17:00:00 237 mL via ORAL

## 2019-10-27 NOTE — Evaluation (Signed)
Physical Therapy Evaluation Patient Details Name: Jeffrey [name] MRN: [id number] DOB: Dec 05, 1940 Today's Date: 10/27/2019   History of Present Illness  78 year old male presents on 11/26 for worsening R foot wound with Xray showing osteomyelitis of R foot, pt s/p R BKA. 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  Clinical Impression  Pt presents with an overall decrease in functional mobility secondary to above. PTA, pt required assist for all mobility. Pt slept in recliner and his wife assisted him to w/c and BSC with use of hoyer lift. Pt uses manual w/c to get to/from dialysis, requires assist to propel w/c. Educ on precautions, positioning, therex, and importance of mobility. Today, pt able to perform rolling in each direction with mod assist +2 for pericare, transferred supine<>sitting EOB with max-total assist and tolerated x 4 min of sitting balance with supervision assist . Therapist discussed return home with patient and his wife (on speaker phone), wife reports she is already assisting him with hoyer lift and is able to provide needed assist. Pt would benefit from continued acute PT services to maximize functional mobility and independence prior to d/c home with home health services in order to progress functional mobility and work on strength, ROM and sitting balance.     Follow Up Recommendations Home health PT;Supervision/Assistance - 24 hour    Equipment Recommendations  None recommended by PT    Recommendations for Other Services       Precautions / Restrictions Precautions Precautions: Fall Precaution Comments: B BKA Restrictions Weight Bearing Restrictions: Yes RLE Weight Bearing: Non weight bearing      Mobility  Bed Mobility Overal bed mobility: Needs Assistance Bed Mobility: Rolling;Supine to Sit;Sit to Supine Rolling: +2 for  physical assistance;Max assist   Supine to sit: Total assist Sit to supine: Max assist   General bed mobility comments: pt performed rolling this session with max assist +2 in each direction while NT present to perform pericare/bathing total assist. Pt performed supine<>sitting EOB this session with max-total assist from therapist  Transfers Overall transfer level: Needs assistance               General transfer comment: did not perform transfer today, hoyer lift total assist transfer for baseline functioning  Ambulation/Gait Ambulation/Gait assistance: (N/A - B BKA)              Stairs            Wheelchair Mobility    Modified Rankin (Stroke Patients Only)       Balance Overall balance assessment: Needs assistance Sitting-balance support: Bilateral upper extremity supported Sitting balance-Leahy Scale: Poor Sitting balance - Comments: mod assist for sitting balance in bed with B UE support, pt with posterior lean, cues and facilitation for anterior weightshift Postural control: Posterior lean   Standing balance-Leahy Scale: (N/A)                               Pertinent Vitals/Pain Pain Assessment: Faces Faces Pain Scale: Hurts even more Pain Location: R distal lower extremity Pain Descriptors / Indicators: Aching;Guarding;Grimacing Pain Intervention(s): Limited activity within patient's tolerance;Monitored during session;Repositioned    Home Living Family/patient expects to be discharged to:: Private residence Living Arrangements: Spouse/significant other;Children Available Help at Discharge: Family;Available 24 hours/day Type of Home: House Home Access: Ramped  entrance     Home Layout: One level Home Equipment: Walker - 2 wheels;Grab bars - toilet;Grab bars - tub/shower;Cane - single point;Bedside commode;Walker - 4 wheels;Other (comment)(hoyer lift) Additional Comments: home O2    Prior Function Level of Independence: Needs  assistance   Gait / Transfers Assistance Needed: Harrel Lemon lift for all transfers. Sleeps in recliner. Assist to push w/c.  ADL's / Homemaking Assistance Needed: Wife assist for all ADLs, including bed baths. Hoyer lift for transfer to [facility]        Hand Dominance   Dominant Hand: Right    Extremity/Trunk Assessment   Upper Extremity Assessment Upper Extremity Assessment: Defer to OT evaluation    Lower Extremity Assessment Lower Extremity Assessment: RLE deficits/detail;LLE deficits/detail;Generalized weakness RLE Deficits / Details: limited hip extension and knee extension ROM with tight hamstrings/hip flexors. able to lift LE against gravity in supine RLE: Unable to fully assess due to pain RLE Sensation: history of peripheral neuropathy LLE Deficits / Details: limited hip extension and knee extension ROM with tight hamstrings/hip flexors. able to lift LE against gravity in supine and hold against minimal resistance LLE Sensation: history of peripheral neuropathy    Cervical / Trunk Assessment Cervical / Trunk Assessment: Kyphotic;Lordotic  Communication   Communication: HOH  Cognition Arousal/Alertness: Awake/alert Behavior During Therapy: WFL for tasks assessed/performed Overall Cognitive Status: History of cognitive impairments - at baseline                                        General Comments General comments (skin integrity, edema, etc.): Therapist re applied ace wrap this session to R distal limb for adequate compression    Exercises     Assessment/Plan    PT Assessment Patient needs continued PT services  PT Problem List Decreased strength;Decreased mobility;Decreased safety awareness;Decreased coordination;Decreased range of motion;Decreased activity tolerance;Decreased skin integrity;Decreased balance;Impaired sensation;Pain       PT Treatment Interventions DME instruction;Therapeutic activities;Therapeutic exercise;Patient/family  education;Balance training;Wheelchair mobility training;Functional mobility training    PT Goals (Current goals can be found in the Care Plan section)  Acute Rehab PT Goals Patient Stated Goal: "go home" PT Goal Formulation: With patient/family Time For Goal Achievement: 11/10/19 Potential to Achieve Goals: Good    Frequency Min 3X/week   Barriers to discharge        Co-evaluation               AM-PAC PT "6 Clicks" Mobility  Outcome Measure Help needed turning from your back to your side while in a flat bed without using bedrails?: A Lot Help needed moving from lying on your back to sitting on the side of a flat bed without using bedrails?: Total Help needed moving to and from a bed to a chair (including a wheelchair)?: Total Help needed standing up from a chair using your arms (e.g., wheelchair or bedside chair)?: Total Help needed to walk in hospital room?: Total Help needed climbing 3-5 steps with a railing? : Total 6 Click Score: 7    End of Session   Activity Tolerance: Patient tolerated treatment well;Patient limited by pain Patient left: in bed;with bed alarm set;with call bell/phone within reach Nurse Communication: Need for lift equipment;Mobility status PT Visit Diagnosis: Muscle weakness (generalized) (M62.81);Other abnormalities of gait and mobility (R26.89);Pain Pain - Right/Left: Right Pain - part of body: Leg    Time: [id number] PT Time Calculation (min) (ACUTE ONLY):  26 min   Charges:   PT Evaluation $PT Eval Moderate Complexity: 1 Mod PT Treatments $Therapeutic Activity: 8-22 mins        Netta Corrigan, PT, DPT, CSRS Acute Rehab Office [place] 10/27/2019, 10:15 AM

## 2019-10-27 NOTE — Progress Notes (Signed)
[facility] Progress Note   Subjective:  Seen in room. Says last night was "rough" d/t pain and anxiety (per nursing notes). Denies CP/dyspnea. Wearing nasal O2. We skipped his HD yesterday d/t post-op pain - will be coming for dialysis this afternoon.  Objective Vitals:   10/27/19 0423 10/27/19 0424 10/27/19 0508 10/27/19 0752  BP:      Pulse:      Resp: 12 13 15    Temp:    (!) 97.4 F (36.3 C)  TempSrc:    Axillary  SpO2:  98%    Weight:      Height:       Physical Exam General: Chronically ill appearing man, NAD Heart: RRR; 2/6 murmur Lungs: CTA anteriorly Abdomen: soft, non-tender Extremities: L BKA with trace stump edema. R BKA bandaged/dry - JP drain with bloody output. Dialysis Access: LUE AVF + thrill  Additional Objective Labs: Basic Metabolic Panel: Recent Labs  Lab 10/23/19 0347 10/24/19 0209 10/26/19 0730 10/27/19 0104  NA 134* 136 132* 135  K 4.0 5.1 4.1 5.0  CL 95* 94* 92* 94*  CO2 27 24 25  20*  GLUCOSE 149* 60* 122* 158*  BUN 29* 35* 30* 35*  CREATININE 4.18* 4.89* 4.77* 5.22*  CALCIUM 7.7* 8.4* 8.1* 8.4*  PHOS 4.3  --   --   --    Liver Function Tests: Recent Labs  Lab 10/21/19 1543 10/22/19 1931 10/23/19 0347  AST 35 39  --   ALT 15 17  --   ALKPHOS 146* 126  --   BILITOT 2.9* 2.3*  --   PROT 6.5 5.4*  --   ALBUMIN 2.4* 1.9* 1.8*   CBC: Recent Labs  Lab 10/21/19 1543 10/22/19 1931  10/24/19 0209 10/25/19 1331 10/26/19 0730 10/26/19 1907 10/27/19 0104  WBC 10.9* 12.5*   < > 12.7* 11.4* 12.3* 10.3 7.6  NEUTROABS 8.4* 9.8*  --   --   --   --   --   --   HGB 11.7* 10.6*   < > 11.7* 11.3* 10.7* 9.7* 10.1*  HCT 36.5* 32.1*   < > 36.1* 34.6* 32.0* 29.9* 31.0*  MCV 94.8 91.7   < > 93.5 92.3 91.4 92.6 92.5  PLT 182 191   < > 191 190 156 161 184   < > = values in this interval not displayed.   Blood Culture    Component Value Date/Time   SDES BLOOD RIGHT HAND POST VANC/ROCEPHIN 10/22/2019 2158   SPECREQUEST   10/22/2019 2158    BOTTLES DRAWN AEROBIC AND ANAEROBIC Blood Culture adequate volume   CULT  10/22/2019 2158    NO GROWTH 5 DAYS Performed at [facility], [place] [address], [place], [place] [address]    REPTSTATUS 10/27/2019 FINAL 10/22/2019 2158   Medications: . cefTRIAXone (ROCEPHIN)  IV 2 g (10/26/19 1728)  . vancomycin 1,000 mg (10/24/19 1511)  . vancomycin     . acetaminophen  1,000 mg Oral TID  . atorvastatin  80 mg Oral q1800  . Chlorhexidine Gluconate Cloth  6 each Topical Q0600  . cinacalcet  30 mg Oral Once per day on Mon Tue Thu Sat  . darbepoetin (ARANESP) injection - DIALYSIS  60 mcg Intravenous Q Tue-HD  . insulin aspart  0-6 Units Subcutaneous TID WC  . insulin detemir  10 Units Subcutaneous QHS  . midodrine  10 mg Oral Once per day on Mon Tue Thu Sat  . midodrine  2.5 mg Oral Once  per day on Mon Tue Thu Sat  . midodrine  5 mg Oral 3 times per day on Sun Wed Fri  . mometasone-formoterol  2 puff Inhalation BID  . polyethylene glycol  17 g Oral Daily  . senna  1 tablet Oral Daily  . sevelamer carbonate  3,200 mg Oral TID WC  . sodium chloride flush  3 mL Intravenous Q12H  . vancomycin variable dose per unstable renal function (pharmacist dosing)   Does not apply See admin instructions    Dialysis Orders: MTTS at [facility] 4 hours, 200NRe, BFR 400, BFR 800, EDW 97kg, 2K/2.25 Ca, UF profile 2, no heparin - Hectorol 1 mcg IV q HD - Mircera 73mcg IV q 2 weeks- last dose 11/24   Assessment/Plan: 1. 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 OnVanc/Ceftriaxone - to continue for 3-5 more days. 2. ESRD:Usual MTTS schedule - held HD on 11/20 d/t pain - for dialysis today. No heparin. 3. Hypertension/volume:Chronically hypotensive. Some edema on exam but no increased O2 requirement. Continue midodrine 2.5 mg PO TID and 10mg  before dialysis. 4. Anemia:Hgb down to 9.7 post-op. Will give Aranesp 60mg   today. 5. Metabolic bone disease:Corrected Ca slightly high, holding hectorol.Continue sensipar.Phos stable, continue home Renvela. 6. Nutrition:Renal/carb modified diet with fluid restrictions. Adding pro-stat + Nepro. 7. Permanent A-Fib/ heart block:rate controlled/slightly bradycardic. Has pacemaker.  8. COPD: On O2 3L at baseline. 9.  T2DM: Insulin per primary.   Veneta Penton, PA-C 10/27/2019, 10:10 AM  [employer] Pager: [phone]

## 2019-10-27 NOTE — Progress Notes (Signed)
Pt. sbp low as documneted with HD tx. Veneta Penton PA made aware orders for albumin 25g x1. Keep sbp>80. Pt. stable

## 2019-10-27 NOTE — Progress Notes (Signed)
Patient's BP 70/35. Dewaine Oats, NP notified. Patient was given dilaudid two hours prior for severe pain at his surgical site. BP possibly r/t earlier dose. NP advised to take pressure again in an hour. Will continue to monitor.

## 2019-10-27 NOTE — Progress Notes (Addendum)
  Progress Note  VASCULAR SURGERY ASSESSMENT & PLAN:   POD 1 - RIGHT AKA: His dressing is dry.  We will change his dressing tomorrow.  His JP drained 76/30 cc for the last 2 shifts.  This can probably come out tomorrow.  ANTICOAGULATION: He is on IV heparin.  He is oral anticoagulant can be started today.  ID: He is on Rocephin and vancomycin.  He will likely need at least 3-5 more days of intravenous antibiotics given the extent of his cellulitis.  Jeffrey Mayo, MD Office: [phone]   10/27/2019 9:34 AM 1 Day Post-Op  Subjective:  Says he's having some pain   Afebrile  Vitals:   10/27/19 0508 10/27/19 0752  BP:    Pulse:    Resp: 15   Temp:  (!) 97.4 F (36.3 C)  SpO2:      Physical Exam: Incisions:  Dressing in tact and clean    CBC    Component Value Date/Time   WBC 7.6 10/27/2019 0104   RBC 3.35 (L) 10/27/2019 0104   HGB 10.1 (L) 10/27/2019 0104   HGB 9.2 (L) 01/07/2019 1108   HCT 31.0 (L) 10/27/2019 0104   HCT 29.6 (L) 01/07/2019 1108   PLT 184 10/27/2019 0104   PLT 211 01/07/2019 1108   MCV 92.5 10/27/2019 0104   MCV 93 01/07/2019 1108   MCH 30.1 10/27/2019 0104   MCHC 32.6 10/27/2019 0104   RDW 18.4 (H) 10/27/2019 0104   RDW 17.3 (H) 01/07/2019 1108   LYMPHSABS 0.9 10/22/2019 1931   LYMPHSABS 1.4 01/07/2019 1108   MONOABS 1.4 (H) 10/22/2019 1931   EOSABS 0.2 10/22/2019 1931   EOSABS 0.1 01/07/2019 1108   BASOSABS 0.1 10/22/2019 1931   BASOSABS 0.0 01/07/2019 1108    BMET    Component Value Date/Time   NA 135 10/27/2019 0104   NA 137 01/07/2019 1108   K 5.0 10/27/2019 0104   CL 94 (L) 10/27/2019 0104   CO2 20 (L) 10/27/2019 0104   GLUCOSE 158 (H) 10/27/2019 0104   BUN 35 (H) 10/27/2019 0104   BUN 29 (H) 01/07/2019 1108   CREATININE 5.22 (H) 10/27/2019 0104   CALCIUM 8.4 (L) 10/27/2019 0104   GFRNONAA 10 (L) 10/27/2019 0104   GFRAA 11 (L) 10/27/2019 0104    INR    Component Value Date/Time   INR 1.5 (H) 10/26/2019 2016     Intake/Output Summary (Last 24 hours) at 10/27/2019 0934 Last data filed at 10/27/2019 0300 Gross per 24 hour  Intake 798 ml  Output 306 ml  Net 492 ml     Assessment/Plan:  78 y.o. male is s/p right below knee amputation  1 Day Post-Op  -pt dressing is dry and in tact.  PT in room and states she just rewrapped the ace.  Some bloody drainage on kerlix but not on ace.  Will take dressing off tomorrow.  -JP drain with 105cc since surgery. Will leave one more day.      Jeffrey Locket, PA-C [facility] [phone] 10/27/2019 9:34 AM

## 2019-10-27 NOTE — Progress Notes (Signed)
Patient became anxious during the night and experienced some claustrophobia in the bed. Wanted to have his legs hang over the edge, but unable to given his BKA surgery yesterday.  Increasingly agitated. Patient called his wife and she attempted to calm him down over the phone. Suggested to sit with him. Clarise Cruz RN playing music and talking with him. Using the bed position functions somewhat helpful. Avoiding xanax prn at the moment for concern for low BP.

## 2019-10-27 NOTE — Significant Event (Signed)
Patient s/p right BKA. Notified on BP 82/37. Hemoglobin stable, no excessive bleeding noted to surgical site. Given 500 ml bolus with improvement to 91/45. Patient states has chronic hypotension systolic typically 0000000. Takes midodrine. Patient asymptomatic. Will monitor for signs of bleeding from surgical site.

## 2019-10-27 NOTE — Progress Notes (Signed)
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  Initially given Vanco/cefepime in ED but declined admission to go home for Thanksgiving, went for HD on Thursday and return to ED on 11/26 for further management.  Nephrology consulted for HD and vascular surgery consulted.  Status post right BKA 11/30 with Dr. Scot Dock  A & P   Principal Problem:   Osteomyelitis of right foot ([place]) Active Problems:   ESRD (end stage renal disease) on dialysis [facility])   Atherosclerosis of native coronary artery of native heart with angina pectoris ([place])   Chronic combined systolic and diastolic CHF (congestive heart failure) (HCC)   Hyperlipidemia   COPD (chronic obstructive pulmonary disease) (HCC)   Chronic respiratory failure with hypoxia (HCC)   Chronic hypotension   Complete heart block (HCC)   Presence of permanent cardiac pacemaker   PVD (peripheral vascular disease) (HCC)   Unstageable pressure ulcer of sacral region [facility])   Suspected osteomyelitis of the second distal phalanx of right foot with overlying cellulitis, status post right BKA 11/30 with Dr. Scot Dock, POD 1 Completed 4 days Vanco/Zosyn prior to amputation.  Continues with severe  postop pain today -Continue vancomycin/ceftriaxone -I would try to stay away from oxycodone and morphine given that he is ESRD on HD and these meds can build up significantly in renal failure.  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 -Okay to restart Eliquis per surgery -Telemetry   COPD with chronic hypoxic respiratory failure on 3 L nasal cannula at baseline -Titrate oxygen as needed -Continue nebulizers  Sacral Pressure ulcer noted by nursing on presentation -Wound care  Diabetes with hypoglycemia in setting of insulin and renal insufficiency Hypoglycemia resolved, stable HA1C 6.8 -Continue Levemir to 10 units at nighttime and continue sliding scale  CAD status post PCI Stable -Hold Lopressor and Plavix for now  Hyperlipidemia Stable -Continue atorvastatin  Anxiety and depression -Continue as needed Xanax -Psych consult, appreciate recommendations for medication  DVT prophylaxis: Eliquis Diet: Renal, carb modified, fluid restriction Family Communication: No family at bedside Disposition Plan: Pending clinical stability.  Home health at discharge  Consultants  Vascular surgery Nephrology  Procedures  None  Antibiotics   Anti-infectives (From admission, onward)   Start  Dose/Rate Route Frequency Ordered Stop   10/26/19 1200  vancomycin (VANCOCIN) IVPB 1000 mg/200 mL premix     1,000 mg 200 mL/hr over 60 Minutes  Intravenous Every Mon (Hemodialysis) 10/24/19 0721     10/24/19 1200  vancomycin (VANCOCIN) IVPB 1000 mg/200 mL premix     1,000 mg 200 mL/hr over 60 Minutes Intravenous Every T-Th-Sa (Hemodialysis) 10/24/19 0721     10/23/19 1800  cefTRIAXone (ROCEPHIN) 2 g in sodium chloride 0.9 % 100 mL IVPB     2 g 200 mL/hr over 30 Minutes Intravenous Every 24 hours 10/22/19 2323     10/23/19 0000  vancomycin variable dose per unstable renal function (pharmacist dosing)      Does not apply See admin instructions 10/22/19 1958     10/22/19 1945  vancomycin (VANCOCIN) IVPB 1000 mg/200 mL premix     1,000 mg 200 mL/hr over 60 Minutes Intravenous  Once 10/22/19 1931 10/22/19 2250   10/22/19 1945  cefTRIAXone (ROCEPHIN) 2 g in sodium chloride 0.9 % 100 mL IVPB     2 g 200 mL/hr over 30 Minutes Intravenous  Once 10/22/19 1931 10/22/19 2138           Subjective   Patient seen at bedside this morning severe pain and discomfort in his right lower extremity post surgery yesterday.  He was given oxycodone before my arrival.  He was also complaining that he was stuck on his right side and could not problem sulfa properly.  I asked the nurse to come help me set him up straight which were successfully able to do.  Once patient calm down he was able to rest for a while before dialysis without needing further pain meds.  He continues to cry out wishes he could just die due to his severe pain.  Denies other complaints  Objective   Vitals:   10/27/19 1530 10/27/19 1600 10/27/19 1617 10/27/19 1653  BP: (!) 105/39 (!) 100/56 (!) 93/58 (!) 103/47  Pulse: 78 79 (!) 55 66  Resp:   18 20  Temp:   97.6 F (36.4 C) 98.3 F (36.8 C)  TempSrc:   Oral Oral  SpO2:   96% 100%  Weight:   102 kg 100.9 kg  Height:        Intake/Output Summary (Last 24 hours) at 10/27/2019 1742 Last data filed at 10/27/2019 1617 Gross per 24 hour  Intake 120 ml  Output 1290 ml  Net -1170 ml   Filed Weights   10/27/19 1215  10/27/19 1617 10/27/19 1653  Weight: 102.2 kg 102 kg 100.9 kg    Examination:  Physical Exam Vitals signs and nursing note reviewed.  Constitutional:      General: He is in acute distress.     Comments: Appears chronically In severe distress secondary to pain lying on his right side  HENT:     Head: Normocephalic and atraumatic.  Eyes:     Extraocular Movements: Extraocular movements intact.  Cardiovascular:     Rate and Rhythm: Normal rate.     Comments: Paced Pulmonary:     Effort: Pulmonary effort is normal.     Breath sounds: Normal breath sounds.     Comments: On oxygen satting well Abdominal:     General: Abdomen is flat.     Palpations: Abdomen is soft.  Musculoskeletal:     Comments: Status post bilateral BKA right lower extremity with dressing clean dry and intact with serosanguineous fluid draining  Right upper extremity  with edema  Neurological:     Mental Status: He is alert and oriented to person, place, and time.  Psychiatric:        Mood and Affect: Mood is anxious and depressed.        Behavior: Behavior is not aggressive. Behavior is cooperative.     Data Reviewed: I have personally reviewed following labs and imaging studies  CBC: Recent Labs  Lab 10/21/19 1543 10/22/19 1931  10/24/19 0209 10/25/19 1331 10/26/19 0730 10/26/19 1907 10/27/19 0104  WBC 10.9* 12.5*   < > 12.7* 11.4* 12.3* 10.3 7.6  NEUTROABS 8.4* 9.8*  --   --   --   --   --   --   HGB 11.7* 10.6*   < > 11.7* 11.3* 10.7* 9.7* 10.1*  HCT 36.5* 32.1*   < > 36.1* 34.6* 32.0* 29.9* 31.0*  MCV 94.8 91.7   < > 93.5 92.3 91.4 92.6 92.5  PLT 182 191   < > 191 190 156 161 184   < > = values in this interval not displayed.   Basic Metabolic Panel: Recent Labs  Lab 10/22/19 1931 10/23/19 0347 10/24/19 0209 10/26/19 0730 10/27/19 0104  NA 132* 134* 136 132* 135  K 4.1 4.0 5.1 4.1 5.0  CL 94* 95* 94* 92* 94*  CO2 27 27 24 25  20*  GLUCOSE 150* 149* 60* 122* 158*  BUN 27* 29* 35*  30* 35*  CREATININE 4.09* 4.18* 4.89* 4.77* 5.22*  CALCIUM 7.9* 7.7* 8.4* 8.1* 8.4*  MG  --   --   --  1.5*  --   PHOS  --  4.3  --   --   --    GFR: Estimated Creatinine Clearance: 13.6 mL/min (A) (by C-G formula based on SCr of 5.22 mg/dL (H)). Liver Function Tests: Recent Labs  Lab 10/21/19 1543 10/22/19 1931 10/23/19 0347  AST 35 39  --   ALT 15 17  --   ALKPHOS 146* 126  --   BILITOT 2.9* 2.3*  --   PROT 6.5 5.4*  --   ALBUMIN 2.4* 1.9* 1.8*   No results for input(s): LIPASE, AMYLASE in the last 168 hours. No results for input(s): AMMONIA in the last 168 hours. Coagulation Profile: Recent Labs  Lab 10/21/19 1543 10/22/19 1931 10/26/19 0730 10/26/19 2016  INR 2.3* 3.2* 2.0* 1.5*   Cardiac Enzymes: No results for input(s): CKTOTAL, CKMB, CKMBINDEX, TROPONINI in the last 168 hours. BNP (last 3 results) No results for input(s): PROBNP in the last 8760 hours. HbA1C: No results for input(s): HGBA1C in the last 72 hours. CBG: Recent Labs  Lab 10/26/19 1713 10/26/19 2103 10/27/19 0749 10/27/19 1152 10/27/19 1655  GLUCAP 150* 142* 188* 178* 142*   Lipid Profile: No results for input(s): CHOL, HDL, LDLCALC, TRIG, CHOLHDL, LDLDIRECT in the last 72 hours. Thyroid Function Tests: No results for input(s): TSH, T4TOTAL, FREET4, T3FREE, THYROIDAB in the last 72 hours. Anemia Panel: No results for input(s): VITAMINB12, FOLATE, FERRITIN, TIBC, IRON, RETICCTPCT in the last 72 hours. Sepsis Labs: Recent Labs  Lab 10/21/19 1544 10/22/19 1931 10/22/19 2158  LATICACIDVEN 1.8 2.1* 1.0    Recent Results (from the past 240 hour(s))  Culture, blood (Routine x 2)     Status: None   Collection Time: 10/21/19  4:08 PM   Specimen: BLOOD RIGHT ARM  Result Value Ref Range Status   Specimen Description BLOOD RIGHT ARM UPPER  Final   Special Requests   Final  BOTTLES DRAWN AEROBIC ONLY Blood Culture adequate volume   Culture   Final    NO GROWTH 5 DAYS Performed at [facility], [place] [address], [place], [place] [address]    Report Status 10/26/2019 FINAL  Final  Blood Culture (routine x 2)     Status: None   Collection Time: 10/22/19  8:36 PM   Specimen: BLOOD RIGHT WRIST  Result Value Ref Range Status   Specimen Description BLOOD RIGHT WRIST  Final   Special Requests   Final    BOTTLES DRAWN AEROBIC AND ANAEROBIC Blood Culture adequate volume   Culture   Final    NO GROWTH 5 DAYS Performed at [facility], [place] [address] [place], [place] [address]    Report Status 10/27/2019 FINAL  Final  Culture, blood (Routine x 2)     Status: None   Collection Time: 10/22/19  9:58 PM   Specimen: BLOOD RIGHT HAND  Result Value Ref Range Status   Specimen Description BLOOD RIGHT HAND POST VANC/ROCEPHIN  Final   Special Requests   Final    BOTTLES DRAWN AEROBIC AND ANAEROBIC Blood Culture adequate volume   Culture   Final    NO GROWTH 5 DAYS Performed at [facility], [address], [place], [place] [address]    Report Status 10/27/2019 FINAL  Final  SARS CORONAVIRUS 2 (TAT 6-24 HRS) Nasopharyngeal Nasopharyngeal Swab     Status: None   Collection Time: 10/22/19 10:55 PM   Specimen: Nasopharyngeal Swab  Result Value Ref Range Status   SARS Coronavirus 2 NEGATIVE NEGATIVE Final    Comment: (NOTE) SARS-CoV-2 target nucleic acids are NOT DETECTED. The SARS-CoV-2 RNA is generally detectable in upper and lower respiratory specimens during the acute phase of infection. Negative results do not preclude SARS-CoV-2 infection, do not rule out co-infections with other pathogens, and should not be used as the sole basis for treatment or other patient management decisions. Negative results must be combined with clinical observations, patient history, and epidemiological information. The expected result is Negative. Fact Sheet for Patients: SugarRoll.be Fact Sheet for Healthcare Providers:  https://www.woods-mathews.com/ This test is not yet approved or cleared by the [place] FDA and  has been authorized for detection and/or diagnosis of SARS-CoV-2 by FDA under an Emergency Use Authorization (EUA). This EUA will remain  in effect (meaning this test can be used) for the duration of the COVID-19 declaration under Section 56 4(b)(1) of the Act, 21 U.S.C. section 360bbb-3(b)(1), unless the authorization is terminated or revoked sooner. Performed at [facility], [place] [address], [place], [place] [address]          Radiology Studies: No results found.      Scheduled Meds: . acetaminophen  1,000 mg Oral TID  . atorvastatin  80 mg Oral q1800  . Chlorhexidine Gluconate Cloth  6 each Topical Q0600  . cinacalcet  30 mg Oral Once per day on Mon Tue Thu Sat  . darbepoetin (ARANESP) injection - DIALYSIS  60 mcg Intravenous Q Tue-HD  . feeding supplement (NEPRO CARB STEADY)  237 mL Oral BID BM  . feeding supplement (PRO-STAT SUGAR FREE 64)  30 mL Oral BID  . insulin aspart  0-6 Units Subcutaneous TID WC  . insulin detemir  10 Units Subcutaneous QHS  . midodrine  10 mg Oral Once per day on Mon Tue Thu Sat  . midodrine  2.5 mg Oral Once per day on Mon Tue  Thu Sat  . midodrine  5 mg Oral 3 times per day on Sun Wed Fri  . mometasone-formoterol  2 puff Inhalation BID  . polyethylene glycol  17 g Oral Daily  . senna  1 tablet Oral Daily  . sevelamer carbonate  3,200 mg Oral TID WC  . sodium chloride flush  3 mL Intravenous Q12H  . vancomycin variable dose per unstable renal function (pharmacist dosing)   Does not apply See admin instructions   Continuous Infusions: . cefTRIAXone (ROCEPHIN)  IV 2 g (10/27/19 1715)  . vancomycin Stopped (10/27/19 1617)  . vancomycin       LOS: 5 days    Time spent: 30 minutes with over 50% of the time coordinating the patient's care    Harold Hedge, DO [facility] Pager [phone]  If 7PM-7AM,  please contact night-coverage [email or web address] Password [facility] 10/27/2019, 5:42 PM

## 2019-10-28 ENCOUNTER — Ambulatory Visit (HOSPITAL_BASED_OUTPATIENT_CLINIC_OR_DEPARTMENT_OTHER): Payer: Medicare Other | Admitting: Physician Assistant

## 2019-10-28 ENCOUNTER — Inpatient Hospital Stay (HOSPITAL_COMMUNITY): Payer: Medicare Other

## 2019-10-28 DIAGNOSIS — J9621 Acute and chronic respiratory failure with hypoxia: Secondary | ICD-10-CM

## 2019-10-28 LAB — BASIC METABOLIC PANEL
Anion gap: 18 — ABNORMAL HIGH (ref 5–15)
BUN: 22 mg/dL (ref 8–23)
CO2: 23 mmol/L (ref 22–32)
Calcium: 8.3 mg/dL — ABNORMAL LOW (ref 8.9–10.3)
Chloride: 96 mmol/L — ABNORMAL LOW (ref 98–111)
Creatinine, Ser: 3.41 mg/dL — ABNORMAL HIGH (ref 0.61–1.24)
GFR calc Af Amer: 19 mL/min — ABNORMAL LOW (ref >60)
GFR calc non Af Amer: 16 mL/min — ABNORMAL LOW (ref >60)
Glucose, Bld: 137 mg/dL — ABNORMAL HIGH (ref 70–99)
Potassium: 4 mmol/L (ref 3.5–5.1)
Sodium: 137 mmol/L (ref 135–145)

## 2019-10-28 LAB — CBC
HCT: 28.1 % — ABNORMAL LOW (ref 39.0–52.0)
Hemoglobin: 9.3 g/dL — ABNORMAL LOW (ref 13.0–17.0)
MCH: 30.3 pg (ref 26.0–34.0)
MCHC: 33.1 g/dL (ref 30.0–36.0)
MCV: 91.5 fL (ref 80.0–100.0)
Platelets: 143 10*3/uL — ABNORMAL LOW (ref 150–400)
RBC: 3.07 MIL/uL — ABNORMAL LOW (ref 4.22–5.81)
RDW: 18.8 % — ABNORMAL HIGH (ref 11.5–15.5)
WBC: 7.9 10*3/uL (ref 4.0–10.5)
nRBC: 0 % (ref 0.0–0.2)

## 2019-10-28 LAB — SURGICAL PATHOLOGY

## 2019-10-28 LAB — GLUCOSE, CAPILLARY
Glucose-Capillary: 135 mg/dL — ABNORMAL HIGH (ref 70–99)
Glucose-Capillary: 141 mg/dL — ABNORMAL HIGH (ref 70–99)
Glucose-Capillary: 152 mg/dL — ABNORMAL HIGH (ref 70–99)
Glucose-Capillary: 142 mg/dL — ABNORMAL HIGH (ref 70–99)
Glucose-Capillary: 146 mg/dL — ABNORMAL HIGH (ref 70–99)

## 2019-10-28 LAB — MRSA PCR SCREENING: MRSA by PCR: NEGATIVE

## 2019-10-28 LAB — APTT: aPTT: 44 seconds — ABNORMAL HIGH (ref 24–36)

## 2019-10-28 MED ORDER — CHLORHEXIDINE GLUCONATE CLOTH 2 % EX PADS
6.0000 | MEDICATED_PAD | Freq: Every day | CUTANEOUS | Status: DC
  Administered 2019-10-29 – 2019-10-30 (×2): 6 via TOPICAL

## 2019-10-28 MED ORDER — HYDROMORPHONE HCL 1 MG/ML IJ SOLN
0.5000 mg | INTRAMUSCULAR | Status: DC | PRN
  Administered 2019-10-29 – 2019-10-30 (×2): 0.5 mg via INTRAVENOUS
  Filled 2019-10-28: qty 1
  Filled 2019-10-28: qty 0.5

## 2019-10-28 MED ORDER — HYDROMORPHONE HCL 1 MG/ML IJ SOLN
0.5000 mg | Freq: Four times a day (QID) | INTRAMUSCULAR | Status: DC | PRN
  Administered 2019-10-28: 0.5 mg via INTRAVENOUS
  Filled 2019-10-28: qty 0.5

## 2019-10-28 MED ORDER — METHOCARBAMOL 500 MG PO TABS
750.0000 mg | ORAL_TABLET | Freq: Three times a day (TID) | ORAL | Status: DC | PRN
  Administered 2019-10-28 – 2019-10-29 (×2): 750 mg via ORAL
  Filled 2019-10-28 (×2): qty 1

## 2019-10-28 MED ORDER — LORAZEPAM 2 MG/ML IJ SOLN
0.5000 mg | Freq: Once | INTRAMUSCULAR | Status: DC | PRN
  Filled 2019-10-28: qty 1

## 2019-10-28 NOTE — Progress Notes (Addendum)
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  Initially given Vanco/cefepime in ED but declined admission to go home for Thanksgiving, went for HD on Thursday and return to ED on 11/26 for further management.  Nephrology consulted for HD and vascular surgery consulted.  Status post right BKA 11/30 with Dr. Scot Dock  A & P   Principal Problem:   Osteomyelitis of right foot ([place]) Active Problems:   ESRD (end stage renal disease) on dialysis [facility])   Atherosclerosis of native coronary artery of native heart with angina pectoris ([place])   Chronic combined systolic and diastolic CHF (congestive heart failure) (HCC)   Hyperlipidemia   COPD (chronic obstructive pulmonary disease) (HCC)   Chronic respiratory failure with hypoxia (HCC)   Chronic hypotension   Complete heart block (HCC)   Presence of permanent cardiac pacemaker   PVD (peripheral vascular disease) (HCC)   Unstageable pressure ulcer of sacral region [facility])   Suspected osteomyelitis of the second distal phalanx of right foot with overlying cellulitis, status post right BKA 11/30 with Dr. Scot Dock, POD 2 Day 7/10 antibiotics, currently vancomycin/Rocephin  Postop pain  improved  -Continue IV antibiotics for total 10 days (5 days total post procedure) -Avoid oxycodone and morphine given that he is ESRD on HD and these meds can build up significantly in renal failure.   -Dilaudid 0.5 mg every 4 hours as needed for severe pain -PT/OT recommending home health PT -Follow up with palliative outpatient  Acute hypoxic respiratory failure secondary to right pleural effusion SPO2 mid 80s while lying flat on 4 L nasal cannula, resolved when sitting upright.  Chest x-ray this a.m. with right pleural effusion and adjacent atelectasis/consolidation. Likely volume overload and less likely pneumonia Afebrile without leukocytosis however on antibiotics as above.   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 -Renally dosed Eliquis restarted -Telemetry   COPD with acute on chronic chronic hypoxic respiratory failure on 3 L nasal cannula at baseline As above -Titrate oxygen as needed -Continue nebulizers  Sacral Pressure ulcer noted by nursing on presentation -Wound care  Diabetes with hypoglycemia in setting of insulin and renal insufficiency Hypoglycemia resolved, stable HA1C 6.8 -Continue Levemir to 10 units at nighttime and continue sliding scale  CAD status post PCI Stable -Hold Lopressor and Plavix for now  Hyperlipidemia Stable -Continue atorvastatin  Anxiety and depression -Continue as needed Xanax -Psych  consult, appreciate recommendations for medication  DVT prophylaxis: Eliquis Diet: Renal, carb modified, fluid restriction Family  Communication: Discussed with wife at bedside Disposition Plan: Pending clinical stability.  Home health at discharge.  Possible discharge tomorrow after HD.  Barriers to discharge are pain management and respiratory status  Consultants  Vascular surgery Nephrology  Procedures  None  Antibiotics   Anti-infectives (From admission, onward)   Start     Dose/Rate Route Frequency Ordered Stop   10/26/19 1200  vancomycin (VANCOCIN) IVPB 1000 mg/200 mL premix     1,000 mg 200 mL/hr over 60 Minutes Intravenous Every Mon (Hemodialysis) 10/24/19 0721     10/24/19 1200  vancomycin (VANCOCIN) IVPB 1000 mg/200 mL premix     1,000 mg 200 mL/hr over 60 Minutes Intravenous Every T-Th-Sa (Hemodialysis) 10/24/19 0721     10/23/19 1800  cefTRIAXone (ROCEPHIN) 2 g in sodium chloride 0.9 % 100 mL IVPB     2 g 200 mL/hr over 30 Minutes Intravenous Every 24 hours 10/22/19 2323     10/23/19 0000  vancomycin variable dose per unstable renal function (pharmacist dosing)      Does not apply See admin instructions 10/22/19 1958     10/22/19 1945  vancomycin (VANCOCIN) IVPB 1000 mg/200 mL premix     1,000 mg 200 mL/hr over 60 Minutes Intravenous  Once 10/22/19 1931 10/22/19 2250   10/22/19 1945  cefTRIAXone (ROCEPHIN) 2 g in sodium chloride 0.9 % 100 mL IVPB     2 g 200 mL/hr over 30 Minutes Intravenous  Once 10/22/19 1931 10/22/19 2138           Subjective   Upon arrival to the room, patient was sleeping comfortably upon look to have creased work of breathing, SPO2 mid 80s.  He was arousable easily to verbal stimuli.  Bedside nurse help me set him up and he had resolution of his hypoxia.  Admits his pain is significantly improved today.  Denies any complaints  Objective   Vitals:   10/28/19 0800 10/28/19 0820 10/28/19 1200 10/28/19 1204  BP: (!) 102/44  (!) 96/52   Pulse: 65  65   Resp: 20 17 14    Temp: 97.7 F (36.5 C)   98.3 F (36.8 C)  TempSrc: Axillary   Axillary  SpO2: 93% 96% 95%    Weight:      Height:        Intake/Output Summary (Last 24 hours) at 10/28/2019 1438 Last data filed at 10/28/2019 1300 Gross per 24 hour  Intake 1328.22 ml  Output 1174 ml  Net 154.22 ml   Filed Weights   10/27/19 1617 10/27/19 1653 10/28/19 0444  Weight: 102 kg 100.9 kg 98.4 kg    Examination:  Physical Exam Vitals signs and nursing note reviewed.  Constitutional:      General: He is in acute distress.     Appearance: He is obese.  HENT:     Head: Normocephalic.     Mouth/Throat:     Mouth: Mucous membranes are moist.  Cardiovascular:     Rate and Rhythm: Normal rate.     Comments: Paced rhythm Pulmonary:     Effort: Respiratory distress present.     Breath sounds: Rales present. No wheezing.  Abdominal:     General: Abdomen is flat.     Palpations: Abdomen is soft.  Musculoskeletal:     Comments: Status post bilateral lower extremity BKA, right lower extremity with dressing clean dry and intact  Neurological:  General: No focal deficit present.     Mental Status: He is alert and oriented to person, place, and time. Mental status is at baseline.  Psychiatric:        Mood and Affect: Mood normal.        Behavior: Behavior normal.     Data Reviewed: I have personally reviewed following labs and imaging studies  CBC: Recent Labs  Lab 10/21/19 1543 10/22/19 1931  10/25/19 1331 10/26/19 0730 10/26/19 1907 10/27/19 0104 10/28/19 0213  WBC 10.9* 12.5*   < > 11.4* 12.3* 10.3 7.6 7.9  NEUTROABS 8.4* 9.8*  --   --   --   --   --   --   HGB 11.7* 10.6*   < > 11.3* 10.7* 9.7* 10.1* 9.3*  HCT 36.5* 32.1*   < > 34.6* 32.0* 29.9* 31.0* 28.1*  MCV 94.8 91.7   < > 92.3 91.4 92.6 92.5 91.5  PLT 182 191   < > 190 156 161 184 143*   < > = values in this interval not displayed.   Basic Metabolic Panel: Recent Labs  Lab 10/23/19 0347 10/24/19 0209 10/26/19 0730 10/27/19 0104 10/28/19 0213  NA 134* 136 132* 135 137  K 4.0 5.1 4.1 5.0 4.0  CL 95* 94* 92* 94*  96*  CO2 27 24 25  20* 23  GLUCOSE 149* 60* 122* 158* 137*  BUN 29* 35* 30* 35* 22  CREATININE 4.18* 4.89* 4.77* 5.22* 3.41*  CALCIUM 7.7* 8.4* 8.1* 8.4* 8.3*  MG  --   --  1.5*  --   --   PHOS 4.3  --   --   --   --    GFR: Estimated Creatinine Clearance: 20.7 mL/min (A) (by C-G formula based on SCr of 3.41 mg/dL (H)). Liver Function Tests: Recent Labs  Lab 10/21/19 1543 10/22/19 1931 10/23/19 0347  AST 35 39  --   ALT 15 17  --   ALKPHOS 146* 126  --   BILITOT 2.9* 2.3*  --   PROT 6.5 5.4*  --   ALBUMIN 2.4* 1.9* 1.8*   No results for input(s): LIPASE, AMYLASE in the last 168 hours. No results for input(s): AMMONIA in the last 168 hours. Coagulation Profile: Recent Labs  Lab 10/21/19 1543 10/22/19 1931 10/26/19 0730 10/26/19 2016  INR 2.3* 3.2* 2.0* 1.5*   Cardiac Enzymes: No results for input(s): CKTOTAL, CKMB, CKMBINDEX, TROPONINI in the last 168 hours. BNP (last 3 results) No results for input(s): PROBNP in the last 8760 hours. HbA1C: No results for input(s): HGBA1C in the last 72 hours. CBG: Recent Labs  Lab 10/27/19 1152 10/27/19 1655 10/27/19 2012 10/28/19 0758 10/28/19 1203  GLUCAP 178* 142* 130* 135* 141*   Lipid Profile: No results for input(s): CHOL, HDL, LDLCALC, TRIG, CHOLHDL, LDLDIRECT in the last 72 hours. Thyroid Function Tests: No results for input(s): TSH, T4TOTAL, FREET4, T3FREE, THYROIDAB in the last 72 hours. Anemia Panel: No results for input(s): VITAMINB12, FOLATE, FERRITIN, TIBC, IRON, RETICCTPCT in the last 72 hours. Sepsis Labs: Recent Labs  Lab 10/21/19 1544 10/22/19 1931 10/22/19 2158  LATICACIDVEN 1.8 2.1* 1.0    Recent Results (from the past 240 hour(s))  Culture, blood (Routine x 2)     Status: None   Collection Time: 10/21/19  4:08 PM   Specimen: BLOOD RIGHT ARM  Result Value Ref Range Status   Specimen Description BLOOD RIGHT ARM UPPER  Final   Special Requests   Final  BOTTLES DRAWN AEROBIC ONLY Blood Culture  adequate volume   Culture   Final    NO GROWTH 5 DAYS Performed at [facility], [place] [address], [place], [place] [address]    Report Status 10/26/2019 FINAL  Final  Blood Culture (routine x 2)     Status: None   Collection Time: 10/22/19  8:36 PM   Specimen: BLOOD RIGHT WRIST  Result Value Ref Range Status   Specimen Description BLOOD RIGHT WRIST  Final   Special Requests   Final    BOTTLES DRAWN AEROBIC AND ANAEROBIC Blood Culture adequate volume   Culture   Final    NO GROWTH 5 DAYS Performed at [facility], [place] [address] [place], [place] [address]    Report Status 10/27/2019 FINAL  Final  Culture, blood (Routine x 2)     Status: None   Collection Time: 10/22/19  9:58 PM   Specimen: BLOOD RIGHT HAND  Result Value Ref Range Status   Specimen Description BLOOD RIGHT HAND POST VANC/ROCEPHIN  Final   Special Requests   Final    BOTTLES DRAWN AEROBIC AND ANAEROBIC Blood Culture adequate volume   Culture   Final    NO GROWTH 5 DAYS Performed at [facility], [place] [address], [place], [place] [address]    Report Status 10/27/2019 FINAL  Final  SARS CORONAVIRUS 2 (TAT 6-24 HRS) Nasopharyngeal Nasopharyngeal Swab     Status: None   Collection Time: 10/22/19 10:55 PM   Specimen: Nasopharyngeal Swab  Result Value Ref Range Status   SARS Coronavirus 2 NEGATIVE NEGATIVE Final    Comment: (NOTE) SARS-CoV-2 target nucleic acids are NOT DETECTED. The SARS-CoV-2 RNA is generally detectable in upper and lower respiratory specimens during the acute phase of infection. Negative results do not preclude SARS-CoV-2 infection, do not rule out co-infections with other pathogens, and should not be used as the sole basis for treatment or other patient management decisions. Negative results must be combined with clinical observations, patient history, and epidemiological information. The expected result is Negative. Fact Sheet for Patients:  SugarRoll.be Fact Sheet for Healthcare Providers: https://www.woods-mathews.com/ This test is not yet approved or cleared by the [place] FDA and  has been authorized for detection and/or diagnosis of SARS-CoV-2 by FDA under an Emergency Use Authorization (EUA). This EUA will remain  in effect (meaning this test can be used) for the duration of the COVID-19 declaration under Section 56 4(b)(1) of the Act, 21 U.S.C. section 360bbb-3(b)(1), unless the authorization is terminated or revoked sooner. Performed at [facility], [place] [address], [place], [place] [address]          Radiology Studies: Dg Chest Port 1 View  Result Date: 10/28/2019 CLINICAL DATA:  Cough EXAM: PORTABLE CHEST 1 VIEW COMPARISON:  October 22, 2019 FINDINGS: Low lung volumes. Right pleural effusion and adjacent atelectasis/consolidation. Mild left basilar atelectasis. Chronic interstitial prominence. Stable cardiomediastinal contours. 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 IMPRESSION: Right pleural effusion with adjacent atelectasis/consolidation. Mild left basilar atelectasis. Cardiomegaly. Electronically Signed   By: Macy Mis M.D.   On: 10/28/2019 10:53        Scheduled Meds: . acetaminophen  1,000 mg Oral TID  . apixaban  2.5 mg Oral BID  . atorvastatin  80 mg Oral q1800  . [START ON 10/29/2019] Chlorhexidine Gluconate Cloth  6 each Topical Q0600  . cinacalcet  30 mg Oral Once per day  on Mon Tue Thu Sat  . darbepoetin (ARANESP) injection - DIALYSIS  60 mcg Intravenous Q Tue-HD  . feeding supplement (NEPRO CARB STEADY)  237 mL Oral BID BM  . feeding supplement (PRO-STAT SUGAR FREE 64)  30 mL Oral BID  . insulin aspart  0-6 Units Subcutaneous TID WC  . insulin detemir  10 Units Subcutaneous QHS  . midodrine  10 mg Oral Once per day on Mon Tue Thu Sat  . midodrine  2.5 mg Oral Once per day on Mon Tue  Thu Sat  . midodrine  5 mg Oral 3 times per day on Sun Wed Fri  . mometasone-formoterol  2 puff Inhalation BID  . polyethylene glycol  17 g Oral Daily  . senna  1 tablet Oral Daily  . sevelamer carbonate  3,200 mg Oral TID WC  . sodium chloride flush  3 mL Intravenous Q12H  . vancomycin variable dose per unstable renal function (pharmacist dosing)   Does not apply See admin instructions   Continuous Infusions: . cefTRIAXone (ROCEPHIN)  IV 2 g (10/27/19 1715)  . vancomycin Stopped (10/27/19 1617)  . vancomycin       LOS: 6 days    Time spent: 33 minutes with over 50% of the time coordinating the patient's care    Harold Hedge, DO [facility] Pager [phone]  If 7PM-7AM, please contact night-coverage [email or web address] Password [facility] 10/28/2019, 2:38 PM

## 2019-10-28 NOTE — Consult Note (Signed)
Telepsych Consultation   Reason for Consult: "Depression, expressing wish to die" Referring Physician:  Dr Neysa Bonito Location of Patient: [facility] [id number]  Location of Provider: [facility]  Patient Identification: Jeffrey [name] MRN:  [id number] Principal Diagnosis: Osteomyelitis of right foot [facility]) Diagnosis:  Principal Problem:   Osteomyelitis of right foot ([place]) Active Problems:   ESRD (end stage renal disease) on dialysis [facility])   Atherosclerosis of native coronary artery of native heart with angina pectoris ([place])   Chronic combined systolic and diastolic CHF (congestive heart failure) ([place])   Hyperlipidemia   COPD (chronic obstructive pulmonary disease) ([place])   Chronic respiratory failure with hypoxia (HCC)   Chronic hypotension   Complete heart block ([place])   Presence of permanent cardiac pacemaker   PVD (peripheral vascular disease) ([place])   Unstageable pressure ulcer of sacral region [facility])   Total Time spent with patient: 30 minutes  Subjective:   Jeffrey [name] is a 78 y.o. male patient admitted with left BKA and suicidal ideation. Patient alert and oriented, answers appropriately. Patient denies suicidal and homicidal ideations. Patient denies history of self-harm. Patient states "I love myself too much, I have never thought about anything like that." Patient denies paranoia and hallucinations. Patient denies history of psychiatric treatment. Patient insightful, "my mood was low when I had to come here for this but I got over it."  Patient lives with wife and adult son. Patient states "my son is a great help to me."  Case discussed with Dr Dwyane Dee.  HPI: Patient admitted for left BKA, suicidal ideations identified.    Past Psychiatric History: Denies  Risk to Self:   Risk to Others:   Prior Inpatient Therapy:   Prior Outpatient Therapy:    Past Medical History:  Past Medical History:  Diagnosis Date  . Anginal pain ([place])   . Aortic stenosis    a.  severe by echo 09/2014  . Atrial fibrillation ([place])    a. not well documented, not on anticoagulation  . CHF (congestive heart failure) ([place])    04/28/17 echo-EF 40%, mod LVH, diastolic dysfunction  . Claustrophobia   . Complete heart block ([place])   . COPD (chronic obstructive pulmonary disease) ([place])   . Coronary artery disease    a. chronically occluded RCA per cath 09/2014 with collaterals B. cath 05/01/17 chr occ RCA w/collaterals, 60-70% mid LAD,   . CVA (cerebral vascular accident) ([place]) 10/2014   denies residual on 07/11/2015  . ESRD (end stage renal disease) on dialysis [facility])    a. on dialysis; [place]; MWF, LUE fistula (07/11/2015)  . History of blood transfusion    "related to gallbladder OR"  . History of stomach ulcers   . Hyperlipidemia   . Hypertension   . Iron deficiency anemia   . Myocardial infarction ([place]) 10/2014  . Peripheral vascular disease ([place])   . Pneumonia   . Presence of permanent cardiac pacemaker   . 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 Type II diabetes mellitus ([place])     Past Surgical History:  Procedure Laterality Date  . ABDOMINAL AORTOGRAM W/LOWER EXTREMITY Bilateral 03/27/2019   Procedure: ABDOMINAL AORTOGRAM W/LOWER EXTREMITY;  Surgeon: Angelia Mould, MD;  Location: [place] CV LAB;  Service: Cardiovascular;  Laterality: Bilateral;  . AMPUTATION Left 04/14/2019   Procedure: AMPUTATION BELOW KNEE;  Surgeon: Angelia Mould, MD;  Location: [place] OR;  Service: Vascular;  Laterality: Left;  . AMPUTATION Right 10/26/2019   Procedure: RIGHT BELOW KNEE AMPUTATION;  Surgeon: Angelia Mould, MD;  Location: [place];  Service: Vascular;  Laterality: Right;  . AV FISTULA PLACEMENT Left 10/19/2014   Procedure: BRACHIOCEPHALIC ARTERIOVENOUS (AV) FISTULA CREATION ;  Surgeon: Conrad Edgeley, MD;  Location: [place];  Service:  Vascular;  Laterality: Left;  . CARDIAC CATHETERIZATION    . CARDIAC CATHETERIZATION N/A 07/22/2015   Procedure: Right/Left Heart Cath and Coronary Angiography;  Surgeon: [name], MD;  Location: [place] CV LAB;  Service: Cardiovascular;  Laterality: N/A;  . CATARACT EXTRACTION W/ INTRAOCULAR LENS  IMPLANT, BILATERAL Bilateral 1990's  . CHOLECYSTECTOMY OPEN  1980's  . COLONOSCOPY W/ BIOPSIES AND POLYPECTOMY    . CORONARY ANGIOGRAPHY N/A 07/31/2018   Procedure: CORONARY ANGIOGRAPHY;  Surgeon: Martinique, Peter M, MD;  Location: [place] CV LAB;  Service: Cardiovascular;  Laterality: N/A;  . CORONARY ANGIOPLASTY    . CORONARY ATHERECTOMY N/A 04/18/2018   Procedure: CORONARY ATHERECTOMY;  Surgeon: [name], MD;  Location: [place] CV LAB;  Service: Cardiovascular;  Laterality: N/A;  . CORONARY BALLOON ANGIOPLASTY N/A 07/31/2018   Procedure: CORONARY BALLOON ANGIOPLASTY;  Surgeon: Martinique, Peter M, MD;  Location: [place] CV LAB;  Service: Cardiovascular;  Laterality: N/A;  . CORONARY STENT INTERVENTION N/A 04/18/2018   Procedure: CORONARY STENT INTERVENTION;  Surgeon: [name], MD;  Location: [place] CV LAB;  Service: Cardiovascular;  Laterality: N/A;  . EP IMPLANTABLE DEVICE N/A 07/11/2015   Procedure: Pacemaker Implant;  Surgeon: [name], MD;  Location: [place] [facility];  Service: Cardiovascular;  Laterality: N/A;  . ESOPHAGOGASTRODUODENOSCOPY  08/01/2012   Procedure: ESOPHAGOGASTRODUODENOSCOPY (EGD);  Surgeon: [name], MD;  Location[facility];  Service: Endoscopy;  Laterality: N/A;  . INSERT / REPLACE / REMOVE PACEMAKER  07/11/2015  . INSERTION OF DIALYSIS CATHETER Right 02/02/2015   Procedure: INSERTION OF DIALYSIS CATHETER  RIGHT INTERNAL JUGULAR;  Surgeon: Mal [name], MD;  Location: [place];  Service: Vascular;  Laterality: Right;  . LEFT AND RIGHT HEART CATHETERIZATION WITH CORONARY ANGIOGRAM N/A 09/30/2014   Procedure:  LEFT AND RIGHT HEART CATHETERIZATION WITH CORONARY ANGIOGRAM;  Surgeon: [name], MD;  Location: [facility] C[facility] LAB;  Service: Cardiovascular;  Laterality: N/A;  . LEFT HEART CATH AND CORONARY ANGIOGRAPHY N/A 04/14/2018   Procedure: LEFT HEART CATH AND CORONARY ANGIOGRAPHY;  Surgeon: [name], MD;  Location: [place] CV LAB;  Service: Cardiovascular;  Laterality: N/A;  . TEE WITHOUT CARDIOVERSION N/A 08/02/2015   Procedure: TRANSESOPHAGEAL ECHOCARDIOGRAM (TEE);  Surgeon: [name], MD;  Location: [place];  Service: Open Heart Surgery;  Laterality: N/A;  . TONSILLECTOMY    . TRANSCATHETER AORTIC VALVE REPLACEMENT, TRANSFEMORAL Left 08/02/2015   Procedure: TRANSCATHETER AORTIC VALVE REPLACEMENT, TRANSFEMORAL;  Surgeon: [name], MD;  Location: [place];  Service: Open Heart Surgery;  Laterality: Left;   Family History:  Family History  Problem Relation Age of Onset  . Diabetes Father   . Heart disease Father   . Diabetes Sister   . Alzheimer's disease Mother   . Stroke Mother   . Diabetes Paternal Grandmother   . Alzheimer's disease Maternal Aunt        x 8 Maternal Aunts  . Cancer Maternal Uncle        type unknown   Family Psychiatric  History: Denies Social History:  Social History  Substance and Sexual Activity  Alcohol Use Yes   Comment: rarely     Social History   Substance and Sexual Activity  Drug Use No    Social History   Socioeconomic History  . Marital status: Married    Spouse name: Not on file  . Number of children: 3  . Years of education: Not on file  . Highest education level: Not on file  Occupational History  . Occupation: retired  Scientific laboratory technician  . Financial resource strain: Not on file  . Food insecurity    Worry: Not on file    Inability: Not on file  . Transportation needs    Medical: Not on file    Non-medical: Not on file  Tobacco Use  . Smoking status: Former Smoker    Packs/day: 2.00    Years: 32.00    Pack years: 64.00     Quit date: 11/27/1983    Years since quitting: 35.9  . Smokeless tobacco: Never Used  Substance and Sexual Activity  . Alcohol use: Yes    Comment: rarely  . Drug use: No  . Sexual activity: Yes    Birth control/protection: None  Lifestyle  . Physical activity    Days per week: Not on file    Minutes per session: Not on file  . Stress: Not on file  Relationships  . Social Herbalist on phone: Not on file    Gets together: Not on file    Attends religious service: Not on file    Active member of club or organization: Not on file    Attends meetings of clubs or organizations: Not on file    Relationship status: Not on file  Other Topics Concern  . Not on file  Social History Narrative  . Not on file   Additional Social History:    Allergies:   Allergies  Allergen Reactions  . Byetta 10 Mcg Pen [Exenatide] Diarrhea and Nausea And Vomiting  . Telmisartan Other (See Comments)    Unknown reaction - reported by [facility]  . Codeine Itching    Minor reaction  . Coumadin [Warfarin Sodium] Rash    (wife states coumadin was stopped but rash did not disappear 02/21/19)    Labs:  Results for orders placed or performed during the hospital encounter of 10/22/19 (from the past 48 hour(s))  Glucose, capillary     Status: Abnormal   Collection Time: 10/26/19  5:13 PM  Result Value Ref Range   Glucose-Capillary 150 (H) 70 - 99 mg/dL  CBC     Status: Abnormal   Collection Time: 10/26/19  7:07 PM  Result Value Ref Range   WBC 10.3 4.0 - 10.5 K/uL   RBC 3.23 (L) 4.22 - 5.81 MIL/uL   Hemoglobin 9.7 (L) 13.0 - 17.0 g/dL   HCT 29.9 (L) 39.0 - 52.0 %   MCV 92.6 80.0 - 100.0 fL   MCH 30.0 26.0 - 34.0 pg   MCHC 32.4 30.0 - 36.0 g/dL   RDW 18.3 (H) 11.5 - 15.5 %   Platelets 161 150 - 400 K/uL   nRBC 0.0 0.0 - 0.2 %    Comment: Performed at [facility], [address], [place], [place] [address]  Protime-INR     Status: Abnormal   Collection Time: 10/26/19  8:16 PM   Result Value Ref Range   Prothrombin Time 18.3 (H) 11.4 - 15.2 seconds   INR 1.5 (H) 0.8 - 1.2  Comment: (NOTE) INR goal varies based on device and disease states. Performed at [facility], [place] [address] [place], [place] [address]   APTT     Status: Abnormal   Collection Time: 10/26/19  8:16 PM  Result Value Ref Range   aPTT 45 (H) 24 - 36 seconds    Comment:        IF BASELINE aPTT IS ELEVATED, SUGGEST PATIENT RISK ASSESSMENT BE USED TO DETERMINE APPROPRIATE ANTICOAGULANT THERAPY. Performed at [facility], [place] [address] [place], [place] [address]   Glucose, capillary     Status: Abnormal   Collection Time: 10/26/19  9:03 PM  Result Value Ref Range   Glucose-Capillary 142 (H) 70 - 99 mg/dL  AM BMP     Status: Abnormal   Collection Time: 10/27/19  1:04 AM  Result Value Ref Range   Sodium 135 135 - 145 mmol/L   Potassium 5.0 3.5 - 5.1 mmol/L   Chloride 94 (L) 98 - 111 mmol/L   CO2 20 (L) 22 - 32 mmol/L   Glucose, Bld 158 (H) 70 - 99 mg/dL   BUN 35 (H) 8 - 23 mg/dL   Creatinine, Ser 5.22 (H) 0.61 - 1.24 mg/dL   Calcium 8.4 (L) 8.9 - 10.3 mg/dL   GFR calc non Af Amer 10 (L) >60 mL/min   GFR calc Af Amer 11 (L) >60 mL/min   Anion gap 21 (H) 5 - 15    Comment: Performed at [facility] [address], [place], [place] [address]  CBC     Status: Abnormal   Collection Time: 10/27/19  1:04 AM  Result Value Ref Range   WBC 7.6 4.0 - 10.5 K/uL   RBC 3.35 (L) 4.22 - 5.81 MIL/uL   Hemoglobin 10.1 (L) 13.0 - 17.0 g/dL   HCT 31.0 (L) 39.0 - 52.0 %   MCV 92.5 80.0 - 100.0 fL   MCH 30.1 26.0 - 34.0 pg   MCHC 32.6 30.0 - 36.0 g/dL   RDW 18.4 (H) 11.5 - 15.5 %   Platelets 184 150 - 400 K/uL   nRBC 0.0 0.0 - 0.2 %    Comment: Performed at [facility], [place] [address] [place], [place] [address]  Glucose, capillary     Status: Abnormal   Collection Time: 10/27/19  7:49 AM  Result Value Ref Range   Glucose-Capillary 188 (H) 70 - 99 mg/dL  Glucose,  capillary     Status: Abnormal   Collection Time: 10/27/19 11:52 AM  Result Value Ref Range   Glucose-Capillary 178 (H) 70 - 99 mg/dL  Glucose, capillary     Status: Abnormal   Collection Time: 10/27/19  4:55 PM  Result Value Ref Range   Glucose-Capillary 142 (H) 70 - 99 mg/dL  Glucose, capillary     Status: Abnormal   Collection Time: 10/27/19  8:12 PM  Result Value Ref Range   Glucose-Capillary 130 (H) 70 - 99 mg/dL  APTT     Status: Abnormal   Collection Time: 10/28/19  2:13 AM  Result Value Ref Range   aPTT 44 (H) 24 - 36 seconds    Comment:        IF BASELINE aPTT IS ELEVATED, SUGGEST PATIENT RISK ASSESSMENT BE USED TO DETERMINE APPROPRIATE ANTICOAGULANT THERAPY. Performed at [facility], [place] [address], [place], [place] [address]   CBC     Status: Abnormal   Collection Time: 10/28/19  2:13  AM  Result Value Ref Range   WBC 7.9 4.0 - 10.5 K/uL   RBC 3.07 (L) 4.22 - 5.81 MIL/uL   Hemoglobin 9.3 (L) 13.0 - 17.0 g/dL   HCT 28.1 (L) 39.0 - 52.0 %   MCV 91.5 80.0 - 100.0 fL   MCH 30.3 26.0 - 34.0 pg   MCHC 33.1 30.0 - 36.0 g/dL   RDW 18.8 (H) 11.5 - 15.5 %   Platelets 143 (L) 150 - 400 K/uL   nRBC 0.0 0.0 - 0.2 %    Comment: Performed at [facility] [address], [place], [place] [address]  AM BMP     Status: Abnormal   Collection Time: 10/28/19  2:13 AM  Result Value Ref Range   Sodium 137 135 - 145 mmol/L   Potassium 4.0 3.5 - 5.1 mmol/L   Chloride 96 (L) 98 - 111 mmol/L   CO2 23 22 - 32 mmol/L   Glucose, Bld 137 (H) 70 - 99 mg/dL   BUN 22 8 - 23 mg/dL   Creatinine, Ser 3.41 (H) 0.61 - 1.24 mg/dL    Comment: DELTA CHECK NOTED   Calcium 8.3 (L) 8.9 - 10.3 mg/dL   GFR calc non Af Amer 16 (L) >60 mL/min   GFR calc Af Amer 19 (L) >60 mL/min   Anion gap 18 (H) 5 - 15    Comment: Performed at [facility] [address], [place], [place] [address]  Glucose, capillary     Status: Abnormal   Collection Time: 10/28/19  7:58 AM  Result Value Ref  Range   Glucose-Capillary 135 (H) 70 - 99 mg/dL  Glucose, capillary     Status: Abnormal   Collection Time: 10/28/19 12:03 PM  Result Value Ref Range   Glucose-Capillary 141 (H) 70 - 99 mg/dL    Medications:  Current Facility-Administered Medications  Medication Dose Route Frequency Provider Last Rate Last Dose  . acetaminophen (TYLENOL) tablet 1,000 mg  1,000 mg Oral TID Harold Hedge, MD   1,000 mg at 10/28/19 1440  . albuterol (PROVENTIL) (2.5 MG/3ML) 0.083% nebulizer solution 3 mL  3 mL Inhalation Q4H PRN Ulyses Amor, PA-C      . ALPRAZolam Duanne Moron) tablet 0.25 mg  0.25 mg Oral BID PRN Ulyses Amor, PA-C   0.25 mg at 10/23/19 0801  . apixaban (ELIQUIS) tablet 2.5 mg  2.5 mg Oral BID Harold Hedge, MD   2.5 mg at 10/28/19 0846  . atorvastatin (LIPITOR) tablet 80 mg  80 mg Oral q1800 Ulyses Amor, PA-C   80 mg at 10/27/19 1707  . cefTRIAXone (ROCEPHIN) 2 g in sodium chloride 0.9 % 100 mL IVPB  2 g Intravenous Q24H Laurence Slate M, PA-C 200 mL/hr at 10/27/19 1715 2 g at 10/27/19 1715  . [START ON 10/29/2019] Chlorhexidine Gluconate Cloth 2 % PADS 6 each  6 each Topical Q0600 Loren Racer, PA-C      . cinacalcet (SENSIPAR) tablet 30 mg  30 mg Oral Once per day on Mon Tue Thu Sat Ulyses Amor, PA-C   30 mg at 10/27/19 1707  . Darbepoetin Alfa (ARANESP) injection 60 mcg  60 mcg Intravenous Q Jolyne Loa, PA-C   60 mcg at 10/27/19 1517  . feeding supplement (NEPRO CARB STEADY) liquid 237 mL  237 mL Oral BID BM Loren Racer, PA-C   237 mL at 10/27/19 1713  . feeding supplement (PRO-STAT SUGAR FREE  64) liquid 30 mL  30 mL Oral BID Loren Racer, PA-C   30 mL at 10/28/19 0846  . HYDROmorphone (DILAUDID) injection 0.5 mg  0.5 mg Intravenous Q4H PRN Harold Hedge, MD   0.5 mg at 10/28/19 0845  . insulin aspart (novoLOG) injection 0-6 Units  0-6 Units Subcutaneous TID WC Ulyses Amor, PA-C   Stopped at 10/28/19 [phone]  . insulin detemir (LEVEMIR) injection 10  Units  10 Units Subcutaneous QHS Ulyses Amor, [place]   10 Units at 10/27/19 2302  . midodrine (PROAMATINE) tablet 10 mg  10 mg Oral Once per day on Mon Tue Thu Sat Ulyses Amor, PA-C   10 mg at 10/27/19 1020  . midodrine (PROAMATINE) tablet 2.5 mg  2.5 mg Oral Once per day on Mon Tue Thu Sat Ulyses Amor, PA-C   2.5 mg at 10/27/19 1708  . midodrine (PROAMATINE) tablet 5 mg  5 mg Oral 3 times per day on Sun Wed Fri Ulyses Amor, PA-C   5 mg at 10/28/19 1213  . mometasone-formoterol (DULERA) 200-5 MCG/ACT inhaler 2 puff  2 puff Inhalation BID Ulyses Amor, PA-C   2 puff at 10/28/19 0820  . polyethylene glycol (MIRALAX / GLYCOLAX) packet 17 g  17 g Oral Daily Harold Hedge, MD   17 g at 10/28/19 0845  . senna (SENOKOT) tablet 8.6 mg  1 tablet Oral Daily Harold Hedge, MD   8.6 mg at 10/28/19 0846  . sevelamer carbonate (RENVELA) tablet 3,200 mg  3,200 mg Oral TID WC Ulyses Amor, PA-C   3,200 mg at 10/28/19 0846  . sodium chloride flush (NS) 0.9 % injection 3 mL  3 mL Intravenous Q12H Laurence Slate M, PA-C   3 mL at 10/28/19 0847  . vancomycin (VANCOCIN) IVPB 1000 mg/200 mL premix  1,000 mg Intravenous Q T,Th,Sa-HD Ulyses Amor, PA-C   Stopped at 10/27/19 1617  . vancomycin (VANCOCIN) IVPB 1000 mg/200 mL premix  1,000 mg Intravenous Q Mon-HD Collins, Emma M, PA-C      . vancomycin variable dose per unstable renal function (pharmacist dosing)   Does not apply See admin instructions Ulyses Amor, PA-C        Musculoskeletal: Strength & Muscle Tone: within normal limits Gait & Station: unable to stand Patient leans: N/A  Psychiatric Specialty Exam: Physical Exam  Nursing note and vitals reviewed. Constitutional: He is oriented to person, place, and time. He appears well-developed.  HENT:  Head: Normocephalic.  Cardiovascular: Normal rate.  Respiratory: Effort normal.  Neurological: He is alert and oriented to person, place, and time.  Psychiatric: He has a normal mood  and affect. His behavior is normal. Judgment and thought content normal.    Review of Systems  Constitutional: Negative.   HENT: Negative.   Eyes: Negative.   Respiratory: Negative.   Cardiovascular: Negative.   Gastrointestinal: Negative.   Genitourinary: Negative.   Musculoskeletal: Negative.   Skin: Negative.   Neurological: Negative.   Endo/Heme/Allergies: Negative.     Blood pressure (!) 96/52, pulse 65, temperature 98.3 F (36.8 C), temperature source Axillary, resp. rate 14, height 5\' 11"  (1.803 m), weight 98.4 kg, SpO2 95 %.Body mass index is 30.26 kg/m.  General Appearance: Casual  Eye Contact:  Good  Speech:  Clear and Coherent and Normal Rate  Volume:  Normal  Mood:  Depressed  Affect:  Appropriate and Congruent  Thought Process:  Coherent, Goal Directed and Descriptions of  Associations: Intact  Orientation:  Full (Time, Place, and Person)  Thought Content:  WDL and Logical  Suicidal Thoughts:  No  Homicidal Thoughts:  No  Memory:  Immediate;   Good Recent;   Good Remote;   Good  Judgement:  Fair  Insight:  Fair  Psychomotor Activity:  Normal  Concentration:  Concentration: Good and Attention Span: Good  Recall:  Good  Fund of Knowledge:  Good  Language:  Good  Akathisia:  No  Handed:  Right  AIMS (if indicated):     Assets:  Communication Skills Desire for Improvement Financial Resources/Insurance Housing Social Support  ADL's:  Impaired  Cognition:  WNL  Sleep:        Treatment Plan Summary: Plan cleared by psychiatry  Disposition: No evidence of imminent risk to self or others at present.   Patient does not meet criteria for psychiatric inpatient admission. Supportive therapy provided about ongoing stressors. Discussed crisis plan, support from social network, calling 911, coming to the Emergency Department, and calling Suicide Hotline.  This service was provided via telemedicine using a 2-way, interactive audio and video technology.  Names  of all persons participating in this telemedicine service and their role in this encounter. Name: [name] Role: patient  Name: Letitia Libra Role: [place], [place] 10/28/2019 3:13 PM

## 2019-10-28 NOTE — Progress Notes (Signed)
[facility] Progress Note   Subjective:  Seen in room. Denies CP or dyspnea. Pain better today - had a rough dialysis yesterday - + pain and hyptension - only 1.1L UF. Per vascular surgery note - may be able to d/c to SNF today?  Objective Vitals:   10/28/19 0400 10/28/19 0444 10/28/19 0800 10/28/19 0820  BP: (!) 116/92  (!) 102/44   Pulse: 60  65   Resp: 17  20 17   Temp:   97.7 F (36.5 C)   TempSrc:   Axillary   SpO2: 96%  93% 96%  Weight:  98.4 kg    Height:       Physical Exam General:Chronically ill appearing man, NAD Heart:RRR; 2/6 murmur Lungs:CTA anteriorly Abdomen:soft, non-tender Extremities:L BKA without edema, R BKA bandaged/dry without edema Dialysis Access:LUE AVF + thrill  Additional Objective Labs: Basic Metabolic Panel: Recent Labs  Lab 10/23/19 0347  10/26/19 0730 10/27/19 0104 10/28/19 0213  NA 134*   < > 132* 135 137  K 4.0   < > 4.1 5.0 4.0  CL 95*   < > 92* 94* 96*  CO2 27   < > 25 20* 23  GLUCOSE 149*   < > 122* 158* 137*  BUN 29*   < > 30* 35* 22  CREATININE 4.18*   < > 4.77* 5.22* 3.41*  CALCIUM 7.7*   < > 8.1* 8.4* 8.3*  PHOS 4.3  --   --   --   --    < > = values in this interval not displayed.   Liver Function Tests: Recent Labs  Lab 10/21/19 1543 10/22/19 1931 10/23/19 0347  AST 35 39  --   ALT 15 17  --   ALKPHOS 146* 126  --   BILITOT 2.9* 2.3*  --   PROT 6.5 5.4*  --   ALBUMIN 2.4* 1.9* 1.8*   CBC: Recent Labs  Lab 10/21/19 1543 10/22/19 1931  10/25/19 1331 10/26/19 0730 10/26/19 1907 10/27/19 0104 10/28/19 0213  WBC 10.9* 12.5*   < > 11.4* 12.3* 10.3 7.6 7.9  NEUTROABS 8.4* 9.8*  --   --   --   --   --   --   HGB 11.7* 10.6*   < > 11.3* 10.7* 9.7* 10.1* 9.3*  HCT 36.5* 32.1*   < > 34.6* 32.0* 29.9* 31.0* 28.1*  MCV 94.8 91.7   < > 92.3 91.4 92.6 92.5 91.5  PLT 182 191   < > 190 156 161 184 143*   < > = values in this interval not displayed.   Medications: . cefTRIAXone (ROCEPHIN)  IV  2 g (10/27/19 1715)  . vancomycin Stopped (10/27/19 1617)  . vancomycin     . acetaminophen  1,000 mg Oral TID  . apixaban  2.5 mg Oral BID  . atorvastatin  80 mg Oral q1800  . Chlorhexidine Gluconate Cloth  6 each Topical Q0600  . cinacalcet  30 mg Oral Once per day on Mon Tue Thu Sat  . darbepoetin (ARANESP) injection - DIALYSIS  60 mcg Intravenous Q Tue-HD  . feeding supplement (NEPRO CARB STEADY)  237 mL Oral BID BM  . feeding supplement (PRO-STAT SUGAR FREE 64)  30 mL Oral BID  . insulin aspart  0-6 Units Subcutaneous TID WC  . insulin detemir  10 Units Subcutaneous QHS  . midodrine  10 mg Oral Once per day on Mon Tue Thu Sat  . midodrine  2.5 mg Oral Once  per day on Mon Tue Thu Sat  . midodrine  5 mg Oral 3 times per day on Sun Wed Fri  . mometasone-formoterol  2 puff Inhalation BID  . polyethylene glycol  17 g Oral Daily  . senna  1 tablet Oral Daily  . sevelamer carbonate  3,200 mg Oral TID WC  . sodium chloride flush  3 mL Intravenous Q12H  . vancomycin variable dose per unstable renal function (pharmacist dosing)   Does not apply See admin instructions    Dialysis Orders: [place] 4 hours, 200NRe, BFR 400, BFR 800, EDW 97kg, 2K/2.25 Ca, UF profile 2, no heparin -Hectorol 1 mcg IV q HD -Mircera 17mcg IV q 2 weeks- last dose 11/24  Assessment/Plan: 1. 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 OnVanc/Ceftriaxone - to continue for 3-5 more days, but can be oral per vascular surgery note. 2. ESRD:Usual MTTSschedule - next 12/3. 3. Hypertension/volume:Chronically hypotensive - on midodrine. 4. Anemia:Hgb down to 9.7 post-op. Aranesp 60mg  given [id number]. 5. Metabolic bone disease:CorrectedCa slightly high, holdinghectorol.Continue sensipar.Phosstable, continue home Renvela. 6. Nutrition:Renal/carb modified diet with fluid restrictions. Added pro-stat + Nepro. 7. PermanentA-Fib/ heart block:rate  controlled/slightly bradycardic. Has pacemaker.  8. COPD: On O2 3L at baseline. 9.  T2DM: Insulin per primary.   Veneta Penton, PA-C 10/28/2019, 9:36 AM  [facility] Pager: [phone]

## 2019-10-28 NOTE — Discharge Instructions (Signed)

## 2019-10-28 NOTE — Progress Notes (Signed)
Occupational Therapy Evaluation Patient Details Name: Jeffrey [name] MRN: [id number] DOB: 06-02-1941 Today's Date: 10/28/2019    History of Present Illness 78 year old male presents on 11/26 for worsening R foot wound with Xray showing osteomyelitis of R foot, pt s/p R BKA. 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   Clinical Impression   PTA, pt was living at home with his wife, and required assistance from his wife to transfer to w/c and [facility] with use of hoyer lift. He reports his wife assists with ADL/IADL. Pt currently requires maxA+2 to progress to EOB and was able to tolerate sitting EOB about 5 min with modA to maxA for support. Pt reports he was able to sit unsupported at baseline, per PT note 12/1, pt was able to sit EOB with supervision. Based on pt's physical limitations and cognitive limitations impacting safety and independence with ADL/IADL and functional mobility he would benefit from SNF level therapies prior to returning home. Will continue to follow acutely.     Follow Up Recommendations  SNF    Equipment Recommendations  Hospital bed(defer to next venue)    Recommendations for Other Services       Precautions / Restrictions Precautions Precautions: Fall Precaution Comments: B BKA Restrictions Weight Bearing Restrictions: Yes RLE Weight Bearing: Non weight bearing      Mobility Bed Mobility Overal bed mobility: Needs Assistance Bed Mobility: Rolling;Supine to Sit;Sit to Supine Rolling: +2 for physical assistance;Max assist   Supine to sit: Total assist Sit to supine: Max assist;+2 for physical assistance   General bed mobility comments: maxA+2 for rolling to reposition in bed;pt required maxA+2-totaA+2 to progress to EOB  Transfers                 General transfer comment: did not perform transfer today,  hoyer lift total assist transfer for baseline functioning    Balance Overall balance assessment: Needs assistance Sitting-balance support: Bilateral upper extremity supported Sitting balance-Leahy Scale: Zero Sitting balance - Comments: modA with intermittent maxA for sitting EOB;pt with posterior lean, cues and facilitation for anterior weight shift and pelvic tilt Postural control: Posterior lean   Standing balance-Leahy Scale: (N/A)                             ADL either performed or assessed with clinical judgement   ADL Overall ADL's : Needs assistance/impaired Eating/Feeding: Set up;Sitting   Grooming: Minimal assistance;Sitting Grooming Details (indicate cue type and reason): in supported sitting Upper Body Bathing: Moderate assistance   Lower Body Bathing: Maximal assistance   Upper Body Dressing : Moderate assistance   Lower Body Dressing: Maximal assistance   Toilet Transfer: Maximal assistance;+2 for physical assistance Toilet Transfer Details (indicate cue type and reason): maxA to roll in bed Toileting- Clothing Manipulation and Hygiene: Total assistance       Functional mobility during ADLs: Maximal assistance;+2 for physical assistance;+2 for safety/equipment General ADL Comments: pt able to tolerate sitting EOB about 4 min with modA-maxA for support      Vision Patient Visual Report: No change from baseline       Perception     Praxis      Pertinent Vitals/Pain Pain Assessment: Faces Faces Pain Scale: Hurts even more Pain Location: R distal lower extremity Pain Descriptors / Indicators: Aching;Guarding;Grimacing Pain  Intervention(s): Limited activity within patient's tolerance;Monitored during session     Hand Dominance Right   Extremity/Trunk Assessment Upper Extremity Assessment Upper Extremity Assessment: Generalized weakness   Lower Extremity Assessment Lower Extremity Assessment: Defer to PT evaluation RLE Deficits /  Details: limited hip extension and knee extension ROM with tight hamstrings/hip flexors. able to lift LE against gravity in supine RLE: Unable to fully assess due to pain RLE Sensation: history of peripheral neuropathy LLE Deficits / Details: limited hip extension and knee extension ROM with tight hamstrings/hip flexors. able to lift LE against gravity in supine and hold against minimal resistance LLE Sensation: history of peripheral neuropathy   Cervical / Trunk Assessment Cervical / Trunk Assessment: Kyphotic;Lordotic   Communication Communication Communication: HOH   Cognition Arousal/Alertness: Awake/alert Behavior During Therapy: WFL for tasks assessed/performed Overall Cognitive Status: No family/caregiver present to determine baseline cognitive functioning Area of Impairment: Attention;Memory;Following commands;Awareness;Problem solving                   Current Attention Level: Sustained Memory: Decreased recall of precautions;Decreased short-term memory Following Commands: Follows one step commands with increased time;Follows multi-step commands inconsistently   Awareness: Emergent Problem Solving: Slow processing;Decreased initiation;Difficulty sequencing;Requires verbal cues;Requires tactile cues General Comments: pt perseverating on having tray table infront of him with phones on them, despite frequent redirection to therapy session;pt required vc for sequencing progression to EOB   General Comments  pt on 3lnc throughout session, unclear waveform;IV started bleeding during session, RN alerted and arrived to attend to IV    Exercises     Shoulder Instructions      Home Living Family/patient expects to be discharged to:: Private residence Living Arrangements: Spouse/significant other;Children Available Help at Discharge: Family;Available 24 hours/day Type of Home: House Home Access: Ramped entrance     Home Layout: One level     Bathroom Shower/Tub:  Teacher, early years/pre: Standard     Home Equipment: Walker - 2 wheels;Grab bars - toilet;Grab bars - tub/shower;Cane - single point;Bedside commode;Walker - 4 wheels;Other (comment)(hoyer lift)   Additional Comments: home O2      Prior Functioning/Environment Level of Independence: Needs assistance  Gait / Transfers Assistance Needed: Harrel Lemon lift for all transfers. Sleeps in recliner. Assist to push w/c. ADL's / Homemaking Assistance Needed: Wife assist for all ADLs, including bed baths. Hoyer lift for transfer to [facility]            OT Problem List: Decreased strength;Decreased range of motion;Decreased activity tolerance;Impaired balance (sitting and/or standing);Decreased cognition;Decreased safety awareness;Decreased knowledge of precautions;Cardiopulmonary status limiting activity;Obesity;Pain      OT Treatment/Interventions: Self-care/ADL training;Neuromuscular education;DME and/or AE instruction;Therapeutic activities;Cognitive remediation/compensation;Patient/family education;Balance training;Energy conservation;Therapeutic exercise    OT Goals(Current goals can be found in the care plan section) Acute Rehab OT Goals Patient Stated Goal: "go home" OT Goal Formulation: With patient Time For Goal Achievement: 11/11/19 Potential to Achieve Goals: Fair ADL Goals Pt Will Perform Grooming: with set-up;sitting Pt Will Perform Upper Body Dressing: with min assist Pt Will Perform Lower Body Dressing: with mod assist;sitting/lateral leans Additional ADL Goal #1: Pt will tolerate sitting EOB for 5 min in preparation for ADL. Additional ADL Goal #2: Pt will progress to EOB with modA in preparation for ADL.  OT Frequency: Min 2X/week   Barriers to D/C: Decreased caregiver support  pt reports he lives with his wife       Co-evaluation  AM-PAC OT "6 Clicks" Daily Activity     Outcome Measure Help from another person eating meals?: A Little Help from  another person taking care of personal grooming?: A Lot Help from another person toileting, which includes using toliet, bedpan, or urinal?: Total Help from another person bathing (including washing, rinsing, drying)?: A Lot Help from another person to put on and taking off regular upper body clothing?: A Lot Help from another person to put on and taking off regular lower body clothing?: Total 6 Click Score: 11   End of Session Equipment Utilized During Treatment: Oxygen Nurse Communication: Mobility status(IV bleeding)  Activity Tolerance: Patient tolerated treatment well;Patient limited by fatigue Patient left: in bed;with call bell/phone within reach;with bed alarm set  OT Visit Diagnosis: Unsteadiness on feet (R26.81);Other abnormalities of gait and mobility (R26.89);Muscle weakness (generalized) (M62.81);Other symptoms and signs involving cognitive function;Pain Pain - Right/Left: Right Pain - part of body: Leg                Time: [id number] OT Time Calculation (min): 30 min Charges:  OT General Charges $OT Visit: 1 Visit OT Evaluation $OT Eval Moderate Complexity: 1 Mod OT Treatments $Self Care/Home Management : 8-22 mins  Dorinda Hill OTR/L Acute Rehabilitation Services Office: [phone]   Wyn Forster 10/28/2019, 3:28 PM

## 2019-10-28 NOTE — TOC Initial Note (Signed)
Transition of Care [facility]) - Initial/Assessment Note    Patient Details  Name: Jeffrey [name] MRN: [id number] Date of Birth: 1941-06-12  Transition of Care [facility]) CM/SW Contact:    Sharin Mons, RN Phone Number: 10/28/2019, 2:42 PM  Clinical Narrative:       Presented with R foot wound, hx of  ESRD on MTThS HD, CAD s/p PCI, AS s/p TAVR, CHB s/p PPM, permanent A. fib on Eliquis, PVD s/p left BKA , COPD/chronic respiratory failure on 3 L O2 via [place], CHF,DM, chronic hypotension, hyperlipidemia,CVA.               S/p  RIGHT BELOW KNEE AMPUTATION (Right Knee), 11/30.  From home with wife. No DME needs per pt's wife. PTA referral already  made with [facility] for home health services ( PT, RN). HH resumption orders in place and signed per MD.  JACQUAN VIARS (Spouse)      [phone]       Per wife she will provide transportation to home when medically ready. TOC team will continue to monitor for TOC needs.  Expected Discharge Plan: [place]     Patient Goals and CMS Choice   CMS Medicare.gov Compare Post Acute Care list provided to:: Patient Choice offered to / list presented to : Patient  Expected Discharge Plan and Services Expected Discharge Plan: [place]   Discharge Planning Services: CM Consult   Living arrangements for the past 2 months: Single Family Home                           HH Arranged: PT, RN [facility] Agency: [place] ([place]) Date HH Agency Contacted: 10/28/19 Time [place]: [phone] Representative spoke with at [place]: Butch Penny  Prior Living Arrangements/Services Living arrangements for the past 2 months: [place] with:: Spouse Patient language and need for interpreter reviewed:: Yes Do you feel safe going back to the place where you live?: Yes      Need for Family Participation in Patient Care: Yes (Comment) Care giver support system in place?: Yes (comment)   Criminal  Activity/Legal Involvement Pertinent to Current Situation/Hospitalization: No - Comment as needed  Activities of Daily Living Home Assistive Devices/Equipment: Bedside commode/3-in-1, CBG Meter, [employer] fast streamer, Shower chair with back ADL Screening (condition at time of admission) Patient's cognitive ability adequate to safely complete daily activities?: Yes Is the patient deaf or have difficulty hearing?: No Does the patient have difficulty seeing, even when wearing glasses/contacts?: No Does the patient have difficulty concentrating, remembering, or making decisions?: No Patient able to express need for assistance with ADLs?: Yes Does the patient have difficulty dressing or bathing?: Yes Independently performs ADLs?: No Communication: Independent Dressing (OT): Needs assistance Is this a change from baseline?: Pre-admission baseline Feeding: Independent Bathing: Needs assistance Is this a change from baseline?: Pre-admission baseline Toileting: Needs assistance Is this a change from baseline?: Pre-admission baseline In/Out Bed: Needs assistance Is this a change from baseline?: Pre-admission baseline Walks in Home: Needs assistance Is this a change from baseline?: Pre-admission baseline Does the patient have difficulty walking or climbing stairs?: Yes Weakness of Legs: Both Weakness of Arms/Hands: None  Permission Sought/Granted Permission sought to share information with : Case Manager Permission granted to share information with : Yes, Verbal Permission Granted  Share Information with NAME: SLATEN KILDOW (Spouse)[phone]  Emotional Assessment Appearance:: Appears stated age Attitude/Demeanor/Rapport: Gracious Affect (typically observed): Accepting Orientation: : Oriented to Self, Oriented to Place, Oriented to  Time, Oriented to Situation Alcohol / Substance Use: Not Applicable Psych Involvement: No (comment)  Admission diagnosis:  Cellulitis of right lower  extremity [L03.115] Patient Active Problem List   Diagnosis Date Noted  . Unstageable pressure ulcer of sacral region ([place]) 10/23/2019  . Osteomyelitis of right foot ([place]) 10/22/2019  . PVD (peripheral vascular disease) ([place]) 10/22/2019  . Pressure injury of skin 08/19/2019  . ESRD (end stage renal disease) ([place])   . Advanced care planning/counseling discussion   . Goals of care, counseling/discussion   . Palliative care by specialist   . Diabetes mellitus type 2, uncontrolled, with complications ([place]) [id number]  . Syncope and collapse 08/18/2019  . H/O aortic valve replacement 08/18/2019  . Thyroid nodule 08/18/2019  . End-stage renal disease on hemodialysis ([place]) 08/17/2019  . Chronic hypotension 08/17/2019  . Acute systolic CHF (congestive heart failure) ([place]) 08/17/2019  . AF (paroxysmal atrial fibrillation) ([place]) 08/17/2019  . Complete heart block ([place]) 08/17/2019  . Presence of permanent cardiac pacemaker 08/17/2019  . S/P BKA (below knee amputation), left ([place]) 08/17/2019  . Acute respiratory failure with hypoxia ([place]) 08/15/2019  . Osteomyelitis ([place]) 04/10/2019  . Acute osteomyelitis of left foot ([place]) 04/10/2019  . Cellulitis in diabetic foot ([place]) 02/24/2019  . Chronic anticoagulation 02/24/2019  . Venous stasis 02/24/2019  . Hypotension 02/24/2019  . Discoloration of skin of foot 02/20/2019  . Discoloration of skin of multiple sites of lower extremity 02/20/2019  . RSV (respiratory syncytial virus infection) 11/30/2018  . Troponin level elevated   . COPD exacerbation ([place]) 11/29/2018  . Chronic respiratory failure with hypoxia ([place]) 10/29/2018  . Respiratory distress 07/27/2018  . GERD (gastroesophageal reflux disease) 06/23/2018  . Cough 06/23/2018  . Chest pain 04/15/2018  . NSTEMI (non-ST elevated myocardial infarction) ([place]) 04/14/2018  . Rash 04/14/2018  . COPD (chronic obstructive pulmonary disease) ([place]) 09/26/2017  . Atrial fibrillation ([place]) [I48.91]  05/21/2017  . Encounter for therapeutic drug monitoring 05/21/2017  . Obesity (BMI 30-39.9) 04/17/2017  . Hyponatremia 03/10/2017  . COPD with acute exacerbation ([place]) 03/10/2017  . Non-healing open wound of left groin 09/05/2015  . Automatic implantable cardioverter-defibrillator in situ 08/08/2015  . 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 Hypervolemia   . Chronic combined systolic and diastolic CHF (congestive heart failure) ([place]) 07/19/2015  . Fluid overload 07/18/2015  . Acute on chronic respiratory failure with hypoxia ([place]) 07/18/2015  . 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/2015  . Atherosclerosis of native coronary artery of native heart with angina pectoris ([place])   . Protein-calorie malnutrition, severe ([place]) 01/14/2015  . ESRD (end stage renal disease) on dialysis ([place]) 01/12/2015  . Mobitz type 1 second degree AV block   . Elevated troponin 09/30/2014  . Diabetes type 2, controlled ([place])   . History of stroke 09/29/2014  . Type 2 diabetes mellitus with hypoglycemia without coma ([place]) 09/29/2014  . CAD (coronary artery disease) 09/29/2014  . Essential hypertension   . History of tobacco use 11/21/2012  . Hyperlipidemia 01/19/2010  . Arthritis, degenerative 01/19/2010  . Allergic rhinitis 12/05/2009  . Idiopathic peripheral neuropathy 01/05/2009  . 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  . Anemia of chronic disease 11/13/2007   PCP:  Ronita Hipps, MD Pharmacy:   [id number] - [place], [place] - [address] [place]  [place] [address] Phone: [phone] Fax: [phone]  [facility] - [place], [place] - [address] [place], [place] [address] Phone: [phone] Fax: [phone]     Social Determinants of Health (SDOH) Interventions    Readmission Risk Interventions No flowsheet data found.

## 2019-10-28 NOTE — Plan of Care (Signed)

## 2019-10-28 NOTE — Progress Notes (Signed)
VASCULAR SURGERY ASSESSMENT & PLAN:    POD 2 - RIGHT AKA:  I changed his dressing this morning and removed his JP drain.  The wound is healing well.  Begin daily dressing changes.  ANTICOAGULATION: He is back on his Eliquis.  ID: He is on Rocephin and vancomycin.    The cellulitis is markedly improved. At this point I think he can be switched to po antibiotics if he goes home.    DISPOSITION: Physical therapy has recommended home health physical therapy.  I think this is very reasonable.  He is very anxious to go home.  He can be discharged from my standpoint as long as someone can change his dressing daily (dry 4 x 4's Kerlix and 4 inch Ace daily).  [name], MD Office: [phone]   SUBJECTIVE:   He wants to go home.  PHYSICAL EXAM:   Vitals:   10/27/19 2100 10/27/19 2311 10/28/19 0011 10/28/19 0400  BP:  (!) 104/46 (!) 107/46 (!) 116/92  Pulse:   62 60  Resp: 16 14  17   Temp:   98 F (36.7 C)   TempSrc:   Axillary   SpO2:  96% 96% 96%  Weight:      Height:       I inspected his right below the knee amputation which is healing nicely.  The cellulitis is markedly improved.  I removed his JP drain.   LABS:   Lab Results  Component Value Date   WBC 7.9 10/28/2019   HGB 9.3 (L) 10/28/2019   HCT 28.1 (L) 10/28/2019   MCV 91.5 10/28/2019   PLT 143 (L) 10/28/2019   Lab Results  Component Value Date   CREATININE 3.41 (H) 10/28/2019   Lab Results  Component Value Date   INR 1.5 (H) 10/26/2019   CBG (last 3)  Recent Labs    10/27/19 1152 10/27/19 1655 10/27/19 2012  GLUCAP 178* 142* 130*    PROBLEM LIST:    Principal Problem:   Osteomyelitis of right foot ([place]) Active Problems:   ESRD (end stage renal disease) on dialysis ([place])   Atherosclerosis of native coronary artery of native heart with angina pectoris (HCC)   Chronic combined systolic and diastolic CHF (congestive heart failure) (HCC)   Hyperlipidemia   COPD (chronic obstructive  pulmonary disease) (HCC)   Chronic respiratory failure with hypoxia (HCC)   Chronic hypotension   Complete heart block (HCC)   Presence of permanent cardiac pacemaker   PVD (peripheral vascular disease) (HCC)   Unstageable pressure ulcer of sacral region ([place])   CURRENT MEDS:   . acetaminophen  1,000 mg Oral TID  . apixaban  2.5 mg Oral BID  . atorvastatin  80 mg Oral q1800  . Chlorhexidine Gluconate Cloth  6 each Topical Q0600  . cinacalcet  30 mg Oral Once per day on Mon Tue Thu Sat  . darbepoetin (ARANESP) injection - DIALYSIS  60 mcg Intravenous Q Tue-HD  . feeding supplement (NEPRO CARB STEADY)  237 mL Oral BID BM  . feeding supplement (PRO-STAT SUGAR FREE 64)  30 mL Oral BID  . insulin aspart  0-6 Units Subcutaneous TID WC  . insulin detemir  10 Units Subcutaneous QHS  . midodrine  10 mg Oral Once per day on Mon Tue Thu Sat  . midodrine  2.5 mg Oral Once per day on Mon Tue Thu Sat  . midodrine  5 mg Oral 3 times per day on Sun Wed Fri  . mometasone-formoterol  2 puff Inhalation BID  . polyethylene glycol  17 g Oral Daily  . senna  1 tablet Oral Daily  . sevelamer carbonate  3,200 mg Oral TID WC  . sodium chloride flush  3 mL Intravenous Q12H  . vancomycin variable dose per unstable renal function (pharmacist dosing)   Does not apply See admin instructions    [name] Office: [id number] 10/28/2019

## 2019-10-29 ENCOUNTER — Ambulatory Visit (INDEPENDENT_AMBULATORY_CARE_PROVIDER_SITE_OTHER): Payer: Medicare Other | Admitting: *Deleted

## 2019-10-29 DIAGNOSIS — I442 Atrioventricular block, complete: Secondary | ICD-10-CM

## 2019-10-29 DIAGNOSIS — J690 Pneumonitis due to inhalation of food and vomit: Secondary | ICD-10-CM

## 2019-10-29 LAB — CUP PACEART REMOTE DEVICE CHECK
Battery Remaining Percentage: 65 %
Brady Statistic RA Percent Paced: 0 %
Brady Statistic RV Percent Paced: 88 %
Date Time Interrogation Session: 20201203073744
Implantable Lead Implant Date: 20160815
Implantable Lead Implant Date: 20160815
Implantable Lead Location: 753859
Implantable Lead Location: 753860
Implantable Lead Model: 5076
Implantable Lead Model: 5076
Implantable Pulse Generator Implant Date: 20160815
Lead Channel Impedance Value: 371 Ohm
Lead Channel Impedance Value: 429 Ohm
Lead Channel Sensing Intrinsic Amplitude: 0.8 mV
Lead Channel Sensing Intrinsic Amplitude: 12.3 mV
Lead Channel Setting Pacing Amplitude: 2 V
Lead Channel Setting Pacing Amplitude: 2.4 V
Lead Channel Setting Pacing Pulse Width: 0.4 ms
Lead Channel Setting Sensing Sensitivity: 2 mV
Pulse Gen Model: 394929
Pulse Gen Serial Number: 68600861

## 2019-10-29 LAB — CBC
HCT: 30.2 % — ABNORMAL LOW (ref 39.0–52.0)
Hemoglobin: 9.9 g/dL — ABNORMAL LOW (ref 13.0–17.0)
MCH: 30.4 pg (ref 26.0–34.0)
MCHC: 32.8 g/dL (ref 30.0–36.0)
MCV: 92.6 fL (ref 80.0–100.0)
Platelets: 166 10*3/uL (ref 150–400)
RBC: 3.26 MIL/uL — ABNORMAL LOW (ref 4.22–5.81)
RDW: 19 % — ABNORMAL HIGH (ref 11.5–15.5)
WBC: 12.4 10*3/uL — ABNORMAL HIGH (ref 4.0–10.5)
nRBC: 0 % (ref 0.0–0.2)

## 2019-10-29 LAB — MAGNESIUM: Magnesium: 2 mg/dL (ref 1.7–2.4)

## 2019-10-29 LAB — BASIC METABOLIC PANEL
Anion gap: 16 — ABNORMAL HIGH (ref 5–15)
BUN: 34 mg/dL — ABNORMAL HIGH (ref 8–23)
CO2: 27 mmol/L (ref 22–32)
Calcium: 8.4 mg/dL — ABNORMAL LOW (ref 8.9–10.3)
Chloride: 95 mmol/L — ABNORMAL LOW (ref 98–111)
Creatinine, Ser: 4.18 mg/dL — ABNORMAL HIGH (ref 0.61–1.24)
GFR calc Af Amer: 15 mL/min — ABNORMAL LOW (ref >60)
GFR calc non Af Amer: 13 mL/min — ABNORMAL LOW (ref >60)
Glucose, Bld: 172 mg/dL — ABNORMAL HIGH (ref 70–99)
Potassium: 4.1 mmol/L (ref 3.5–5.1)
Sodium: 138 mmol/L (ref 135–145)

## 2019-10-29 LAB — GLUCOSE, CAPILLARY
Glucose-Capillary: 170 mg/dL — ABNORMAL HIGH (ref 70–99)
Glucose-Capillary: 160 mg/dL — ABNORMAL HIGH (ref 70–99)
Glucose-Capillary: 118 mg/dL — ABNORMAL HIGH (ref 70–99)

## 2019-10-29 MED ORDER — SODIUM CHLORIDE 0.9 % IV SOLN
3.0000 g | Freq: Every day | INTRAVENOUS | Status: DC
  Administered 2019-10-29 – 2019-10-30 (×2): 3 g via INTRAVENOUS
  Filled 2019-10-29: qty 3
  Filled 2019-10-29: qty 8

## 2019-10-29 MED ORDER — DOXYCYCLINE HYCLATE 100 MG PO TABS
100.0000 mg | ORAL_TABLET | Freq: Two times a day (BID) | ORAL | Status: DC
  Administered 2019-10-29 – 2019-10-30 (×3): 100 mg via ORAL
  Filled 2019-10-29 (×3): qty 1

## 2019-10-29 MED ORDER — CALCIUM CARBONATE ANTACID 500 MG PO CHEW
400.0000 mg | CHEWABLE_TABLET | Freq: Once | ORAL | Status: AC | PRN
  Administered 2019-10-29: 16:00:00 400 mg via ORAL

## 2019-10-29 MED ORDER — CALCIUM CARBONATE ANTACID 500 MG PO CHEW: 200.0000 mg | CHEWABLE_TABLET | Freq: Once | ORAL | Status: DC | PRN

## 2019-10-29 MED ORDER — CALCIUM CARBONATE ANTACID 500 MG PO CHEW
CHEWABLE_TABLET | ORAL | Status: AC
  Administered 2019-10-29: 400 mg via ORAL
  Filled 2019-10-29: qty 2

## 2019-10-29 NOTE — Progress Notes (Signed)
   VASCULAR SURGERY ASSESSMENT & PLAN:   POD 3 - RIGHT AKA:His right AKA is healing nicely.  Continue daily dressing changes.  ANTICOAGULATION:He is on Eliquis.  DISPOSITION: Possibly home today after dialysis with home health physical therapy.  I will arrange follow-up in the office for staple removal in 3 to 4 weeks.   SUBJECTIVE:   Wants to go home.  PHYSICAL EXAM:   Vitals:   10/29/19 0200 10/29/19 0456 10/29/19 0720 10/29/19 0825  BP: 93/69  108/60   Pulse:      Resp: 17  (!) 23 18  Temp:  97.7 F (36.5 C) 97.9 F (36.6 C)   TempSrc:  Oral Oral   SpO2: 95%   96%  Weight:      Height:       His right AKA is healing nicely.  No erythema or drainage.  LABS:   Lab Results  Component Value Date   WBC 12.4 (H) 10/29/2019   HGB 9.9 (L) 10/29/2019   HCT 30.2 (L) 10/29/2019   MCV 92.6 10/29/2019   PLT 166 10/29/2019   Lab Results  Component Value Date   CREATININE 4.18 (H) 10/29/2019   Lab Results  Component Value Date   INR 1.5 (H) 10/26/2019   CBG (last 3)  Recent Labs    10/28/19 2015 10/28/19 2243 10/29/19 0820  GLUCAP 142* 146* 170*    PROBLEM LIST:    Principal Problem:   Osteomyelitis of right foot ([place]) Active Problems:   ESRD (end stage renal disease) on dialysis ([place])   Atherosclerosis of native coronary artery of native heart with angina pectoris (HCC)   Chronic combined systolic and diastolic CHF (congestive heart failure) (HCC)   Hyperlipidemia   COPD (chronic obstructive pulmonary disease) (HCC)   Chronic respiratory failure with hypoxia (HCC)   Chronic hypotension   Complete heart block (HCC)   Presence of permanent cardiac pacemaker   PVD (peripheral vascular disease) (HCC)   Unstageable pressure ulcer of sacral region ([place])   CURRENT MEDS:   . acetaminophen  1,000 mg Oral TID  . apixaban  2.5 mg Oral BID  . atorvastatin  80 mg Oral q1800  . Chlorhexidine Gluconate Cloth  6 each Topical Q0600  . cinacalcet  30 mg Oral  Once per day on Mon Tue Thu Sat  . darbepoetin (ARANESP) injection - DIALYSIS  60 mcg Intravenous Q Tue-HD  . feeding supplement (NEPRO CARB STEADY)  237 mL Oral BID BM  . feeding supplement (PRO-STAT SUGAR FREE 64)  30 mL Oral BID  . insulin aspart  0-6 Units Subcutaneous TID WC  . insulin detemir  10 Units Subcutaneous QHS  . midodrine  10 mg Oral Once per day on Mon Tue Thu Sat  . midodrine  2.5 mg Oral Once per day on Mon Tue Thu Sat  . midodrine  5 mg Oral 3 times per day on Sun Wed Fri  . mometasone-formoterol  2 puff Inhalation BID  . polyethylene glycol  17 g Oral Daily  . senna  1 tablet Oral Daily  . sevelamer carbonate  3,200 mg Oral TID WC  . sodium chloride flush  3 mL Intravenous Q12H  . vancomycin variable dose per unstable renal function (pharmacist dosing)   Does not apply See admin instructions    [name] Office: [id number] 10/29/2019

## 2019-10-29 NOTE — Evaluation (Signed)
Clinical/Bedside Swallow Evaluation Patient Details  Name: Jeffrey [name] MRN: [id number] Date of Birth: 23-Jan-1941  Today's Date: 10/29/2019 Time: SLP Start Time (ACUTE ONLY): 1010 SLP Stop Time (ACUTE ONLY): 1035 SLP Time Calculation (min) (ACUTE ONLY): 25 min  Past Medical History:  Past Medical History:  Diagnosis Date  . Anginal pain ([place])   . Aortic stenosis    a. severe by echo 09/2014  . Atrial fibrillation ([place])    a. not well documented, not on anticoagulation  . CHF (congestive heart failure) ([place])    04/28/17 echo-EF 40%, mod LVH, diastolic dysfunction  . Claustrophobia   . Complete heart block ([place])   . COPD (chronic obstructive pulmonary disease) ([place])   . Coronary artery disease    a. chronically occluded RCA per cath 09/2014 with collaterals B. cath 05/01/17 chr occ RCA w/collaterals, 60-70% mid LAD,   . CVA (cerebral vascular accident) ([place]) 10/2014   denies residual on 07/11/2015  . ESRD (end stage renal disease) on dialysis [facility])    a. on dialysis; [facility]; MWF, LUE fistula (07/11/2015)  . History of blood transfusion    "related to gallbladder OR"  . History of stomach ulcers   . Hyperlipidemia   . Hypertension   . Iron deficiency anemia   . Myocardial infarction ([place]) 10/2014  . Peripheral vascular disease ([place])   . Pneumonia   . Presence of permanent cardiac pacemaker   . 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 Type II diabetes mellitus ([place])    Past Surgical History:  Past Surgical History:  Procedure Laterality Date  . ABDOMINAL AORTOGRAM W/LOWER EXTREMITY Bilateral 03/27/2019   Procedure: ABDOMINAL AORTOGRAM W/LOWER EXTREMITY;  Surgeon: Angelia Mould, MD;  Location: [place] [facility];  Service: Cardiovascular;  Laterality: Bilateral;  . AMPUTATION Left 04/14/2019   Procedure: AMPUTATION BELOW KNEE;  Surgeon: Angelia Mould, MD;  Location: [place];  Service: Vascular;  Laterality: Left;  . AMPUTATION Right 10/26/2019   Procedure: RIGHT BELOW KNEE AMPUTATION;  Surgeon: Angelia Mould, MD;  Location: [place];  Service: Vascular;  Laterality: Right;  . AV FISTULA PLACEMENT Left 10/19/2014   Procedure: BRACHIOCEPHALIC ARTERIOVENOUS (AV) FISTULA CREATION ;  Surgeon: Conrad Gurnee, MD;  Location: [place];  Service: Vascular;  Laterality: Left;  . CARDIAC CATHETERIZATION    . CARDIAC CATHETERIZATION N/A 07/22/2015   Procedure: Right/Left Heart Cath and Coronary Angiography;  Surgeon: [name], MD;  Location: [place] [facility];  Service: Cardiovascular;  Laterality: N/A;  . CATARACT EXTRACTION W/ INTRAOCULAR LENS  IMPLANT, BILATERAL Bilateral 1990's  . CHOLECYSTECTOMY OPEN  1980's  . COLONOSCOPY W/ BIOPSIES AND POLYPECTOMY    . CORONARY ANGIOGRAPHY N/A 07/31/2018   Procedure: CORONARY ANGIOGRAPHY;  Surgeon: Martinique, Peter M, MD;  Location: [place] [facility];  Service: Cardiovascular;  Laterality: N/A;  . CORONARY ANGIOPLASTY    . CORONARY ATHERECTOMY N/A 04/18/2018   Procedure: CORONARY ATHERECTOMY;  Surgeon: [name], MD;  Location: [place] [facility];  Service: Cardiovascular;  Laterality: N/A;  . CORONARY BALLOON ANGIOPLASTY N/A 07/31/2018   Procedure: CORONARY BALLOON ANGIOPLASTY;  Surgeon: Martinique, Peter M, MD;  Location: [place] [facility];  Service: Cardiovascular;  Laterality: N/A;  . CORONARY STENT INTERVENTION N/A 04/18/2018   Procedure: CORONARY STENT INTERVENTION;  Surgeon: [name], MD;  Location: [place] [facility];  Service: Cardiovascular;  Laterality: N/A;  . EP IMPLANTABLE DEVICE N/A 07/11/2015   Procedure: Pacemaker Implant;  Surgeon: [name], MD;  Location: [place] [facility];  Service: Cardiovascular;  Laterality: N/A;  . ESOPHAGOGASTRODUODENOSCOPY  08/01/2012   Procedure: ESOPHAGOGASTRODUODENOSCOPY (EGD);  Surgeon: [name], MD;   Location: [facility];  Service: Endoscopy;  Laterality: N/A;  . INSERT / REPLACE / REMOVE PACEMAKER  07/11/2015  . INSERTION OF DIALYSIS CATHETER Right 02/02/2015   Procedure: INSERTION OF DIALYSIS CATHETER  RIGHT INTERNAL JUGULAR;  Surgeon: Mal [name], MD;  Location: [place];  Service: Vascular;  Laterality: Right;  . LEFT AND RIGHT HEART CATHETERIZATION WITH CORONARY ANGIOGRAM N/A 09/30/2014   Procedure: LEFT AND RIGHT HEART CATHETERIZATION WITH CORONARY ANGIOGRAM;  Surgeon: [name], MD;  Location: [facility];  Service: Cardiovascular;  Laterality: N/A;  . LEFT HEART CATH AND CORONARY ANGIOGRAPHY N/A 04/14/2018   Procedure: LEFT HEART CATH AND CORONARY ANGIOGRAPHY;  Surgeon: [name], MD;  Location: [place] [facility];  Service: Cardiovascular;  Laterality: N/A;  . TEE WITHOUT CARDIOVERSION N/A 08/02/2015   Procedure: TRANSESOPHAGEAL ECHOCARDIOGRAM (TEE);  Surgeon: [name], MD;  Location: [place];  Service: Open Heart Surgery;  Laterality: N/A;  . TONSILLECTOMY    . TRANSCATHETER AORTIC VALVE REPLACEMENT, TRANSFEMORAL Left 08/02/2015   Procedure: TRANSCATHETER AORTIC VALVE REPLACEMENT, TRANSFEMORAL;  Surgeon: [name], MD;  Location: [place];  Service: Open Heart Surgery;  Laterality: Left;   HPI:  78 y.o. male admitted 10/22/2019 from home with right foot wound. 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 This couldreflect pneumonia.   Assessment / Plan / Recommendation Clinical Impression  Pt presents with adequate oral motor strength and function. He is edentulous, and  does not have dentures. Pt denies difficulty swallowing, and tolerance of regular solids and thin liquids "if it's good". Pt accepted trials of thin liquid, puree, and solid textures. No immediate cough response noted after po trials, however, several minutes after po presentations had ended, pt exhibited slight dry cough, due to dry cracker per pt report.   Pt was seen for BSE in January 2020, with recommendation for MBS to objectively assess swallow function and safety. Pt declined MBS at that time, and declined again today. Pt has multiple comorbidities which raise risk for aspiration. CXR indicates "diffuse airspace disease". If not discharged today, SLP will follow up at bedside to assess diet tolerance and continue education. MBS continues to be recommended, however pt declines to participate.    SLP Visit Diagnosis: Dysphagia, unspecified (R13.10)    Aspiration Risk  Moderate aspiration risk    Diet Recommendation Regular;Thin liquid   Liquid Administration via: Cup;Straw Medication Administration: (as tolerated) Supervision: Intermittent supervision to cue for compensatory strategies;Staff to assist with self feeding Compensations: Slow rate;Small sips/bites Postural Changes: Seated upright at 90 degrees    Other  Recommendations Oral Care Recommendations: Oral care BID   Follow up Recommendations 24 hour supervision/assistance      Frequency and Duration min 1 x/week  1 week;2 weeks       Prognosis Prognosis  for Safe Diet Advancement: Good      Swallow Study   General Date of Onset: 10/22/19 HPI: 78 y.o. male admitted 10/22/2019 from home with right foot wound. 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 This couldreflect pneumonia. Type of Study: Bedside Swallow Evaluation Previous Swallow Assessment: BSE 11/2018 - reg/thin. MBS recommended but not completed. Diet Prior to this Study: Regular;Thin liquids Temperature Spikes Noted: No Respiratory Status: Nasal cannula History of Recent Intubation: No Behavior/Cognition: Alert;Cooperative;Requires cueing Oral Cavity Assessment: Within Functional Limits Oral Care Completed by SLP: No Oral Cavity - Dentition: Edentulous Vision: Functional for self-feeding Self-Feeding Abilities: Needs assist;Needs set up Patient Positioning: Upright in bed Baseline Vocal Quality: Normal Volitional Cough: Strong Volitional Swallow: Able to elicit    Oral/Motor/Sensory Function     Ice Chips Ice chips: Not tested   Thin Liquid Thin Liquid: Within functional limits Presentation: Straw    Nectar Thick Nectar Thick Liquid: Not tested   Honey Thick Honey Thick Liquid: Not tested   Puree Puree: Within functional limits Presentation: Spoon   Solid     Solid: Within functional limits Presentation: [place], [place], [place] Pathologist Office: [phone] Pager: [phone]  Shonna Chock 10/29/2019,10:46 AM

## 2019-10-29 NOTE — Progress Notes (Signed)
Jeffrey [name] ([id number]) Visit Report for 10/07/2019 Arrival Information Details Patient Name: Date of Service: Jeffrey [name], Jeffrey [name] 10/07/2019 10:15 AM Medical Record [name] Patient Account Number: [id number] Date of Birth/Sex: Treating RN: [birth year] (78 y.o. [name] Primary Care Kerie Badger: Kennith Maes Other Clinician: Referring Kimyatta Lecy: Treating Sharmin Foulk/Extender:[name], [name], [name]: 13 Visit Information History Since Last Visit Added or deleted any medications: No Patient Arrived: Wheel Chair Any new allergies or adverse reactions: No Arrival Time: 10:48 Had a fall or experienced change in No Accompanied By: son activities of daily living that may affect Transfer Assistance: Harrel Lemon Lift risk of falls: Patient Identification Verified: Yes Signs or symptoms of abuse/neglect since last No Secondary Verification Process Yes visito Completed: Hospitalized since last visit: No Patient Requires Transmission- No Implantable device outside of the clinic excluding No Based Precautions: cellular tissue based products placed in the center Patient Has Alerts: Yes since last visit: Patient Alerts: Patient on Blood Has Dressing in Place as Prescribed: Yes Thinner Has Compression in Place as Prescribed: Yes R ABI = .44, R Pain Present Now: Yes TBI= .43 Electronic Signature(s) Signed: 10/12/2019 5:59:11 PM By: [name] RN, BSN Entered By: [name] on 10/07/2019 10:49:07 -------------------------------------------------------------------------------- Clinic Level of Care Assessment Details Patient Name: Date of Service: Jeffrey [name] 10/07/2019 10:15 AM Medical Record [id number] Patient Account Number: [id number] Date of Birth/Sex: Treating RN: 1941/10/02 (78 y.o. [name] Primary Care Damere Brandenburg: Kennith Maes Other Clinician: Referring Eland Lamantia: Treating Fleurette Woolbright/Extender:[name],  [name], [name]: 13 Clinic Level of Care Assessment Items TOOL 4 Quantity Score '[]'  - Use when only an EandM is performed on FOLLOW-UP visit 0 ASSESSMENTS - Nursing Assessment / Reassessment X - Reassessment of Co-morbidities (includes updates in patient status) 1 10 X - Reassessment of Adherence to Treatment Plan 1 5 ASSESSMENTS - Wound and Skin Assessment / Reassessment '[]'  - Simple Wound Assessment / Reassessment - one wound 0 X - Complex Wound Assessment / Reassessment - multiple wounds 6 5 '[]'  - Dermatologic / Skin Assessment (not related to wound area) 0 ASSESSMENTS - Focused Assessment '[]'  - Circumferential Edema Measurements - multi extremities 0 '[]'  - Nutritional Assessment / Counseling / Intervention 0 X - Lower Extremity Assessment (monofilament, tuning fork, pulses) 1 5 '[]'  - Peripheral Arterial Disease Assessment (using hand held doppler) 0 ASSESSMENTS - Ostomy and/or Continence Assessment and Care '[]'  - Incontinence Assessment and Management 0 '[]'  - Ostomy Care Assessment and Management (repouching, etc.) 0 PROCESS - Coordination of Care X - Simple Patient / Family Education for ongoing care 1 15 '[]'  - Complex (extensive) Patient / Family Education for ongoing care 0 X - Staff obtains Programmer, systems, Records, Test Results / Process Orders 1 10 X - Staff telephones HHA, Nursing Homes / Clarify orders / etc 1 10 '[]'  - Routine Transfer to another Facility (non-emergent condition) 0 '[]'  - Routine Hospital Admission (non-emergent condition) 0 '[]'  - New Admissions / Biomedical engineer / Ordering NPWT, Apligraf, etc. 0 '[]'  - Emergency Hospital Admission (emergent condition) 0 X - Simple Discharge Coordination 1 10 '[]'  - Complex (extensive) Discharge Coordination 0 PROCESS - Special Needs '[]'  - Pediatric / Minor Patient Management 0 '[]'  - Isolation Patient Management 0 '[]'  - Hearing / Language / Visual special needs 0 '[]'  - Assessment of Community assistance  (transportation, D/C planning, etc.) 0 '[]'  - Additional assistance / Altered mentation 0 '[]'  - Support Surface(s) Assessment (bed, cushion, seat, etc.) 0 INTERVENTIONS - Wound  Cleansing / Measurement '[]'  - Simple Wound Cleansing - one wound 0 X - Complex Wound Cleansing - multiple wounds 6 5 X - Wound Imaging (photographs - any number of wounds) 1 5 '[]'  - Wound Tracing (instead of photographs) 0 '[]'  - Simple Wound Measurement - one wound 0 X - Complex Wound Measurement - multiple wounds 6 5 INTERVENTIONS - Wound Dressings X - Small Wound Dressing one or multiple wounds 4 10 X - Medium Wound Dressing one or multiple wounds 1 15 '[]'  - Large Wound Dressing one or multiple wounds 0 X - Application of Medications - topical 1 5 '[]'  - Application of Medications - injection 0 INTERVENTIONS - Miscellaneous '[]'  - External ear exam 0 '[]'  - Specimen Collection (cultures, biopsies, blood, body fluids, etc.) 0 '[]'  - Specimen(s) / Culture(s) sent or taken to Lab for analysis 0 '[]'  - Patient Transfer (multiple staff / Civil Service fast streamer / Similar devices) 0 '[]'  - Simple Staple / Suture removal (25 or less) 0 '[]'  - Complex Staple / Suture removal (26 or more) 0 '[]'  - Hypo / Hyperglycemic Management (close monitor of Blood Glucose) 0 '[]'  - Ankle / Brachial Index (ABI) - do not check if billed separately 0 X - Vital Signs 1 5 Has the patient been seen at the hospital within the last three years: Yes Total Score: 225 Level Of Care: New/Established - Level 5 Electronic Signature(s) Signed: 10/07/2019 5:49:26 PM By: Baruch Gouty RN, BSN Entered By: Baruch Gouty on 10/07/2019 11:44:06 -------------------------------------------------------------------------------- Encounter Discharge Information Details Patient Name: Date of Service: Jeffrey [name] 10/07/2019 10:15 AM Medical Record [id number] Patient Account Number: [id number] Date of Birth/Sex: Treating RN: Apr 19, 1941 (78 y.o. [name] Primary Care  Damarion Mendizabal: Kennith Maes Other Clinician: Referring Jagger Demonte: Treating Adriene Knipfer/Extender:[name], [name], [name]: 4 Encounter Discharge Information Items Discharge Condition: Stable Ambulatory Status: Wheelchair Discharge Destination: Home Transportation: Private Auto Accompanied By: son Schedule Follow-up Appointment: Yes Clinical Summary of Care: Electronic Signature(s) Signed: 10/07/2019 5:42:00 PM By: Deon Pilling Entered By: Deon Pilling on 10/07/2019 12:25:52 -------------------------------------------------------------------------------- Lower Extremity Assessment Details Patient Name: Date of Service: Jeffrey [name], Jeffrey [name] 10/07/2019 10:15 AM Medical Record [id number] Patient Account Number: [id number] Date of Birth/Sex: Treating RN: January 10, 1941 (78 y.o. [name] Primary Care Iram Astorino: Kennith Maes Other Clinician: Referring Shaquisha Wynn: Treating Deshante Cassell/Extender:[name], [name], [name]: 13 Edema Assessment Assessed: [Left: No] [Right: No] Edema: [Left: Ye] [Right: s] Calf Left: Right: Point of Measurement: 27 cm From Medial Instep cm 40 cm Ankle Left: Right: Point of Measurement: 10 cm From Medial Instep cm 24 cm Electronic Signature(s) Signed: 10/12/2019 5:59:11 PM By: [name] RN, BSN Entered By: [name] on 10/07/2019 11:19:07 -------------------------------------------------------------------------------- Webb Details Patient Name: Date of Service: Jeffrey [name] 10/07/2019 10:15 AM Medical Record [id number] Patient Account Number: [id number] Date of Birth/Sex: Treating RN: 07/16/1941 (78 y.o. [name] Primary Care Siearra Amberg: Kennith Maes Other Clinician: Referring Hawraa Stambaugh: Treating Sneha Willig/Extender:[name], [name], [name]: 45 Active Inactive Pressure Nursing Diagnoses: Knowledge deficit related to causes  and risk factors for pressure ulcer development Goals: Patient will remain free of pressure ulcers Date Initiated: 07/08/2019 Target Resolution Date: 11/04/2019 Goal Status: Active Interventions: Assess: immobility, friction, shearing, incontinence upon admission and as needed Treatment Activities: Pressure reduction/relief device ordered : 07/08/2019 Notes: Wound/Skin Impairment Nursing Diagnoses: Impaired tissue integrity Knowledge deficit related to ulceration/compromised skin integrity Goals: Patient/caregiver will verbalize understanding of skin care regimen Date Initiated: 07/08/2019  Target Resolution Date: 10/28/2019 Goal Status: Active Ulcer/skin breakdown will have a volume reduction of 30% by week 4 Date Initiated: 07/08/2019 Date Inactivated: 08/05/2019 Target Resolution Date: 08/05/2019 Goal Status: Met Ulcer/skin breakdown will have a volume reduction of 50% by week 8 Date Initiated: 08/05/2019 Date Inactivated: 09/02/2019 Target Resolution Date: 09/04/2019 Unmet Reason: multiple Goal Status: Unmet comorbidities Ulcer/skin breakdown will have a volume reduction of 80% by week 12 Date Initiated: 09/02/2019 Date Inactivated: 09/30/2019 Target Resolution Date: 09/30/2019 Unmet Reason: multiple Goal Status: Unmet Goal Status: Unmet comorbidities Interventions: Assess patient/caregiver ability to obtain necessary supplies Assess patient/caregiver ability to perform ulcer/skin care regimen upon admission and as needed Assess ulceration(s) every visit Treatment Activities: Skin care regimen initiated : 07/08/2019 Topical wound management initiated : 07/08/2019 Notes: Electronic Signature(s) Signed: 10/07/2019 5:49:26 PM By: Baruch Gouty RN, BSN Entered By: Baruch Gouty on 10/07/2019 11:36:34 -------------------------------------------------------------------------------- Pain Assessment Details Patient Name: Date of Service: Jeffrey [name] 10/07/2019 10:15 AM Medical  Record [id number] Patient Account Number: [id number] Date of Birth/Sex: Treating RN: 08/31/1941 (78 y.o. [name] Primary Care Reesha Debes: Kennith Maes Other Clinician: Referring Berlin Mokry: Treating Berdella Bacot/Extender:[name], [name], [name]: 51 Active Problems Location of Pain Severity and Description of Pain Patient Has Paino Yes Site Locations With Dressing Change: Yes Duration of the Pain. Constant / Intermittento Constant Rate the pain. Current Pain Level: 8 Worst Pain Level: 10 Least Pain Level: 2 Character of Pain Describe the Pain: Aching, Burning Pain Management and Medication Current Pain Management: Medication: No Cold Application: No Rest: No Massage: No Activity: No T.E.N.S.: No Heat Application: No Leg drop or elevation: No Is the Current Pain Management Adequate: Inadequate How does your wound impact your activities of daily livingo Sleep: No Bathing: No Appetite: No Relationship With Others: No Bladder Continence: No Emotions: No Bowel Continence: No Work: No Toileting: No Drive: No Dressing: No Hobbies: No Electronic Signature(s) Signed: 10/12/2019 5:59:11 PM By: [name] RN, BSN Entered By: [name] on 10/07/2019 11:01:14 -------------------------------------------------------------------------------- Patient/Caregiver Education Details Patient Name: Jeffrey [name] 11/11/2020andnbsp10:15 Date of Service: AM Medical Record [id number] Number: Patient Account Number: [id number] Treating RN: Date of Birth/Gender: 23-Dec-1940 (78 y.o. [name]) Other Clinician: Primary Care Treating Arvin Collard Physician: Physician/Extender: Referring Physician: Clearence Cheek in Treatment: 13 Education Assessment Education Provided To: Patient Education Topics Provided Pressure: Methods: Explain/Verbal Responses: Reinforcements needed, State content  correctly Venous: Methods: Explain/Verbal Responses: Reinforcements needed, State content correctly Wound/Skin Impairment: Methods: Explain/Verbal Responses: Reinforcements needed, State content correctly Electronic Signature(s) Signed: 10/07/2019 5:49:26 PM By: Baruch Gouty RN, BSN Entered By: Baruch Gouty on 10/07/2019 11:37:05 -------------------------------------------------------------------------------- Wound Assessment Details Patient Name: Date of Service: Jeffrey [name], Jeffrey [name] 10/07/2019 10:15 AM Medical Record [id number] Patient Account Number: [id number] Date of Birth/Sex: Treating RN: 1941-09-12 (78 y.o. [name] Primary Care Kiyla Ringler: Kennith Maes Other Clinician: Referring Sugar Vanzandt: Treating Tayron Hunnell/Extender:[name], [name], [name]: 13 Wound Status Wound Number: 1 Primary Venous Leg Ulcer Etiology: Wound Location: Right Malleolus - Lateral Wound Open Wounding Event: Gradually Appeared Status: Date Acquired: 06/26/2019 Comorbid Cataracts, Chronic Obstructive Pulmonary Weeks Of Treatment: 13 History: Disease (COPD), Congestive Heart Failure, Clustered Wound: No Hypertension, Myocardial Infarction, Peripheral Venous Disease, Type II Diabetes, End Stage Renal Disease, Osteomyelitis Photos Wound Measurements Length: (cm) 1 % Reduc Width: (cm) 0.6 % Reduc Depth: (cm) 0.2 Epithel Area: (cm) 0.471 Tunnel Volume: (cm) 0.094 Underm Wound Description Full Thickness Without Exposed Support Foul O Classification:  Structures Slough Wound Distinct, outline attached Margin: Exudate Small Amount: Exudate Serosanguineous Type: Exudate red, brown Color: Wound Bed Granulation Amount: Medium (34-66%) Granulation Quality: Pink Fascia Necrotic Amount: Medium (34-66%) Fat Lay Necrotic Quality: Adherent Slough Tendon Muscle Exp Joint Expo Bone Expos dor After Cleansing: No /Fibrino Yes Exposed Structure Exposed:  No er (Subcutaneous Tissue) Exposed: Yes Exposed: No osed: No sed: No ed: No tion in Area: -33.4% tion in Volume: -168.6% ialization: None ing: No ining: No Electronic Signature(s) Signed: 10/09/2019 4:02:07 PM By: Mikeal Hawthorne EMT/HBOT Signed: 10/12/2019 5:59:11 PM By: [name] RN, BSN Entered By: Mikeal Hawthorne on 10/09/2019 10:52:54 -------------------------------------------------------------------------------- Wound Assessment Details Patient Name: Date of Service: Jeffrey [name], Jeffrey [name] 10/07/2019 10:15 AM Medical Record [id number] Patient Account Number: [id number] Date of Birth/Sex: Treating RN: Nov 09, 1941 (78 y.o. [name] Primary Care Alwin Lanigan: Kennith Maes Other Clinician: Referring Annajulia Lewing: Treating Nicha Hemann/Extender:[name], [name], [name]: 13 Wound Status Wound Number: 10 Primary Diabetic Wound/Ulcer of the Lower Extremity Etiology: Wound Location: Right Toe Third - Plantar Wound Open Wounding Event: Gradually Appeared Status: Date Acquired: 09/16/2019 Comorbid Cataracts, Chronic Obstructive Pulmonary Weeks Of Treatment: 3 History: Disease (COPD), Congestive Heart Failure, Clustered Wound: No Hypertension, Myocardial Infarction, Peripheral Venous Disease, Type II Diabetes, End Stage Renal Disease, Osteomyelitis Photos Wound Measurements Length: (cm) 0.7 Width: (cm) 1.5 Depth: (cm) 0.2 Area: (cm) 0.825 Volume: (cm) 0.165 Wound Description Classification: Grade 1 Wound Margin: Flat and Intact Exudate Amount: Medium Exudate Type: Sanguinous Exudate Color: red Wound Bed Granulation Amount: Large (67-100%) Granulation Quality: Red, Friable Necrotic Amount: None Present (0%) After Cleansing: No brino No Exposed Structure Exposed: No er (Subcutaneous Tissue) Exposed: Yes Exposed: No Exposed: No xposed: No posed: No % Reduction in Area: -425.5% % Reduction in Volume: -432.3% Epithelialization:  None Tunneling: No Undermining: No Foul Odor Slough/Fi Fascia Fat Lay Tendon Muscle Joint E Bone Ex Electronic Signature(s) Signed: 10/09/2019 4:02:07 PM By: Mikeal Hawthorne EMT/HBOT Signed: 10/12/2019 5:59:11 PM By: [name] RN, BSN Entered By: Mikeal Hawthorne on 10/09/2019 10:53:30 -------------------------------------------------------------------------------- Wound Assessment Details Patient Name: Date of Service: Jeffrey [name], Jeffrey [name] 10/07/2019 10:15 AM Medical Record [id number] Patient Account Number: [id number] Date of Birth/Sex: Treating RN: 03/12/1941 (78 y.o. [name] Primary Care Tacoya Altizer: Kennith Maes Other Clinician: Referring Quantez Schnyder: Treating Kendi Defalco/Extender:[name], [name], [name]: 13 Wound Status Wound Number: 4 Primary Venous Leg Ulcer Etiology: Wound Location: Right Lower Leg - Medial Wound Open Wounding Event: Blister Status: Date Acquired: 07/10/2019 Comorbid Cataracts, Chronic Obstructive Pulmonary Weeks Of Treatment: 12 History: Disease (COPD), Congestive Heart Failure, Clustered Wound: No Hypertension, Myocardial Infarction, Peripheral Venous Disease, Type II Diabetes, End Stage Renal Disease, Osteomyelitis Wound Measurements Length: (cm) 1.5 % Reduc Width: (cm) 4 % Reduc Depth: (cm) 0.2 Epithel Area: (cm) 4.712 Tunnel Volume: (cm) 0.942 Underm Wound Description Classification: Full Thickness Without Exposed Support Foul O Structures Slough Wound Distinct, outline attached Margin: Exudate Small Amount: Exudate Serosanguineous Type: Exudate Exudate red, brown Color: Wound Bed Granulation Amount: Large (67-100%) Granulation Quality: Red Fascia Expos Necrotic Amount: Small (1-33%) Fat Layer (S Necrotic Quality: Adherent Slough Tendon Expos Muscle Expos Joint Expose Bone Exposed dor After Cleansing: No /Fibrino Yes Exposed Structure ed: No ubcutaneous Tissue) Exposed: Yes ed:  No ed: No d: No : No tion in Area: 25.9% tion in Volume: -48.1% ialization: Small (1-33%) ing: No ining: No Electronic Signature(s) Signed: 10/12/2019 5:59:11 PM By: [name] RN, BSN Entered By: [name] on 10/07/2019 11:17:37 --------------------------------------------------------------------------------  Wound Assessment Details Patient Name: Date of Service: Jeffrey [name], Jeffrey [name] 10/07/2019 10:15 AM Medical Record [id number] Patient Account Number: [id number] Date of Birth/Sex: Treating RN: 22-Aug-1941 (78 y.o. 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 Secondary Inflammatory Wounding Event: Gradually Appeared Etiology: Date Acquired: 08/12/2019 Wound Healed - Epithelialized Weeks Of Treatment: 8 Status: Clustered Wound: Yes Comorbid Cataracts, Chronic Obstructive Pulmonary History: Disease (COPD), Congestive Heart Failure, Hypertension, Myocardial Infarction, Peripheral Venous Disease, Type II Diabetes, End Stage Renal Disease, Osteomyelitis Photos Wound Measurements Length: (cm) 0 % Reduction Width: (cm) 0 % Reduction Depth: (cm) 0 Epitheli Clustered Quantity: 2 Tunnelin Area: (cm) 0 Undermi Volume: (cm) 0 Wound Description Classification: Partial Thickness Wound Margin: Distinct, outline attached Exudate Amount: Small Exudate Type: Serosanguineous Exudate Color: red, brown Wound Bed Granulation Amount: Large (67-100%) Granulation Quality: Red Necrotic Amount: None Present (0%) Foul Odor After Cleansing: No Slough/Fibrino No Exposed Structure Fascia Exposed: No Fat Layer (Subcutaneous Tissue) Exposed: Yes Tendon Exposed: No Muscle Exposed: No Joint Exposed: No Bone Exposed: No in Area: 100% in  Volume: 100% alization: Medium (34-66%) g: No ning: No Electronic Signature(s) Signed: 10/13/2019 4:00:22 PM By: Mikeal Hawthorne EMT/HBOT Signed: 10/29/2019 10:53:45 AM By: Baruch Gouty RN, BSN Previous Signature: 10/07/2019 5:49:26 PM Version By: Baruch Gouty RN, BSN Entered By: Mikeal Hawthorne on 10/12/2019 08:48:11 -------------------------------------------------------------------------------- Wound Assessment Details Patient Name: Date of Service: Jeffrey [name], Jeffrey [name] 10/07/2019 10:15 AM Medical Record [id number] Patient Account Number: [id number] Date of Birth/Sex: Treating RN: 1941-06-19 (78 y.o. [name] Primary Care Anay Walter: Kennith Maes Other Clinician: Referring Marketa Midkiff: Treating Gwendola Hornaday/Extender:[name], [name], [name]: 13 Wound Status Wound Number: 7 Primary Diabetic Wound/Ulcer of the Lower Extremity Etiology: Wound Location: Right Toe Second Wound Open Wounding Event: Gradually Appeared Status: Date Acquired: 08/14/2019 Comorbid Cataracts, Chronic Obstructive Pulmonary Weeks Of Treatment: 6 History: Disease (COPD), Congestive Heart Failure, Clustered Wound: No Hypertension, Myocardial Infarction, Peripheral Venous Disease, Type II Diabetes, End Stage Renal Disease, Osteomyelitis Photos Wound Measurements Length: (cm) 2 % Reducti Width: (cm) 1.8 % Reducti Depth: (cm) 0.1 Epithelia Area: (cm) 2.827 Tunnelin Volume: (cm) 0.283 Undermin Wound Description Classification: Grade 2 Wound Margin: Distinct, outline attached Exudate Amount: Small Exudate Type: Serosanguineous Exudate Color: red, brown Wound Bed Granulation Amount: Small (1-33%) Granulation Quality: Pink Necrotic Amount: Large (67-100%) Necrotic Quality: Eschar, Adherent Slough Foul Odor After Cleansing: No Slough/Fibrino Yes Exposed Structure Fascia Exposed: No Fat Layer (Subcutaneous Tissue) Exposed: Yes Tendon Exposed: No Muscle Exposed:  No Joint Exposed: No Bone Exposed: No on in Area: -299.9% on in Volume: -100.7% lization: None g: No ing: No Electronic Signature(s) Signed: 10/09/2019 4:02:07 PM By: Mikeal Hawthorne EMT/HBOT Signed: 10/12/2019 5:59:11 PM By: [name] RN, BSN Entered By: Mikeal Hawthorne on 10/09/2019 10:53:55 -------------------------------------------------------------------------------- Wound Assessment Details Patient Name: Date of Service: Jeffrey [name], Jeffrey [name] 10/07/2019 10:15 AM Medical Record [id number] Patient Account Number: [id number] Date of Birth/Sex: Treating RN: [birth year] (77 y.o. [name] Primary Care Sharyn Brilliant: Kennith Maes Other Clinician: Referring Lemonte Al: Treating Beda Dula/Extender:[name], [name], [name]: 13 Wound Status Wound Number: 8 Primary Pressure Ulcer Etiology: Wound Location: Left Ischium Wound Open Wounding Event: Gradually Appeared Status: Status: Date Acquired: 09/16/2019 Comorbid Cataracts, Chronic Obstructive Pulmonary Weeks Of Treatment: 3 History: Disease (COPD), Congestive Heart Failure, Clustered Wound: No Hypertension, Myocardial Infarction,  Peripheral Venous Disease, Type II Diabetes, End Stage Renal Disease, Osteomyelitis Photos Wound Measurements Length: (cm) 0.1 Width: (cm) 0.1 Depth: (cm) 0.1 Area: (cm) 0.008 Volume: (cm) 0.001 Wound Description Classification: Category/Stage II Exudate Amount: Medium Exudate Type: Serosanguineous Exudate Color: red, brown Wound Bed Granulation Amount: Large (67-100%) Granulation Quality: Red Necrotic Amount: Small (1-33%) Necrotic Quality: Adherent Slough fter Cleansing: No ino No Exposed Structure ed: No ubcutaneous Tissue) Exposed: Yes ed: No ed: No d: No : No % Reduction in Area: 99.5% % Reduction in Volume: 99.4% Epithelialization: None Tunneling: No Undermining: No Foul Odor A Slough/Fibr Fascia Expos Fat Layer (S Tendon  Expos Muscle Expos Joint Expose Bone Exposed Electronic Signature(s) Signed: 10/09/2019 4:02:07 PM By: Mikeal Hawthorne EMT/HBOT Signed: 10/12/2019 5:59:11 PM By: [name] RN, BSN Entered By: Mikeal Hawthorne on 10/09/2019 10:54:50 -------------------------------------------------------------------------------- Wound Assessment Details Patient Name: Date of Service: Jeffrey [name], Jeffrey [name] 10/07/2019 10:15 AM Medical Record [id number] Patient Account Number: [id number] Date of Birth/Sex: Treating RN: 10/01/1941 (78 y.o. [name] Primary Care Elvis Boot: Kennith Maes Other Clinician: Referring Emrik Erhard: Treating Brieanna Nau/Extender:[name], [name], [name]: 13 Wound Status Wound Number: 9 Primary Pressure Ulcer Etiology: Wound Location: Perineum Wound Open Wounding Event: Gradually Appeared Status: Date Acquired: 09/16/2019 Comorbid Cataracts, Chronic Obstructive Pulmonary Weeks Of Treatment: 3 History: Disease (COPD), Congestive Heart Failure, Clustered Wound: No Hypertension, Myocardial Infarction, Peripheral Venous Disease, Type II Diabetes, End Stage Renal Disease, Osteomyelitis Photos Wound Measurements Length: (cm) 0.2 Width: (cm) 0.2 Depth: (cm) 0.1 Area: (cm) 0.031 Volume: (cm) 0.003 Wound Description Classification: Category/Stage II Exudate Amount: Medium Exudate Type: Serosanguineous Exudate Color: red, brown Wound Bed Granulation Amount: Medium (34-66%) Granulation Quality: Red Necrotic Amount: Medium (34-66%) Necrotic Quality: Adherent Slough After Cleansing: No brino Yes Exposed Structure osed: No (Subcutaneous Tissue) Exposed: Yes osed: No osed: No sed: No ed: No % Reduction in Area: 99.1% % Reduction in Volume: 99.7% Epithelialization: None Tunneling: No Undermining: No Foul Odor Slough/Fi Fascia Exp Fat Layer Tendon Exp Muscle Exp Joint Expo Bone Expos Electronic Signature(s) Signed:  10/09/2019 4:02:07 PM By: Mikeal Hawthorne EMT/HBOT Signed: 10/12/2019 5:59:11 PM By: [name] RN, BSN Entered By: Mikeal Hawthorne on 10/09/2019 10:55:12 -------------------------------------------------------------------------------- Anton Ruiz Details Patient Name: Date of Service: Jeffrey [name] 10/07/2019 10:15 AM Medical Record [id number] Patient Account Number: [id number] Date of Birth/Sex: Treating RN: 22-Mar-1941 (78 y.o. [name] Primary Care Aliannah Holstrom: Kennith Maes Other Clinician: Referring Hazley Dezeeuw: Treating Marialena Wollen/Extender:[name], [name], [name]: 13 Vital Signs Time Taken: 11:00 Temperature (F): 97.6 Height (in): 71 Pulse (bpm): 78 Weight (lbs): 209 Respiratory Rate (breaths/min): 18 Body Mass Index (BMI): 29.1 Blood Pressure (mmHg): 91/37 Reference Range: 80 - 120 mg / dl Electronic Signature(s) Signed: 10/12/2019 5:59:11 PM By: [name] RN, BSN Entered By: [name] on 10/07/2019 11:00:21

## 2019-10-29 NOTE — Progress Notes (Signed)
Updated patient's wife and daughter over the phone. They are in agreement with the plan.   Marva Panda, DO

## 2019-10-29 NOTE — Progress Notes (Signed)
Pt was seen for mobility on bed to sit and balance, requiring max assist to reposition and talked with him about trying to stay more upright.  His O2 sat was 75% and alarming the telemetry when PT arrived, and by the end of the session was in 90's with better midline posture.  He is only sitting with max assist balance control, but is aware enough to know when he is listing.  Even so is not reporting to the staff that he is unbalanced and requires help.  Follow acutely for these needs, strengthening core and increasing awareness of his instability in sitting to improve O2 control.  10/29/19 1400  PT Visit Information  Last PT Received On 10/29/19  Assistance Needed +2  History of Present Illness 78 year old male presents on 11/26 for worsening R foot wound with Xray showing osteomyelitis of R foot, pt s/p R BKA. 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  Subjective Data  Subjective states he is hoping to go home soon  Patient Stated Goal "go home"  Precautions  Precautions Fall  Precaution Comments B BKA, watch HR  Restrictions  Weight Bearing Restrictions Yes  RLE Weight Bearing NWB  Pain Assessment  Pain Assessment Faces  Faces Pain Scale 4  Pain Location RUE and LE  Pain Descriptors / Indicators Operative site guarding;Guarding  Pain Intervention(s) Monitored during session;Repositioned  Cognition  Arousal/Alertness Awake/alert;Lethargic  Behavior During Therapy WFL for tasks assessed/performed  Overall Cognitive Status Within Functional Limits for tasks assessed  Area of Impairment Safety/judgement;Following commands;Awareness  Current Attention Level Selective  Memory Decreased short-term memory;Decreased recall of precautions  Following Commands Follows one step commands inconsistently;Follows one step commands with increased time   Safety/Judgement Decreased awareness of deficits  Awareness Intellectual  Problem Solving Slow processing;Requires verbal cues  General Comments pt was on side in bed with O2 sats down and alarming, has no recall of how to correct posture  Bed Mobility  Overal bed mobility Needs Assistance  Bed Mobility Rolling;Supine to Sit  Rolling Max assist  Supine to sit Total assist  General bed mobility comments pt cannot initiate any movement to sit up  Transfers  General transfer comment remained sitting on bed  Balance  Overall balance assessment Needs assistance  Sitting-balance support Bilateral upper extremity supported  Sitting balance-Leahy Scale Poor  Sitting balance - Comments requires full help to forward shift trunk, with tendency to recline into extension  Postural control Posterior lean  Standing balance-Leahy Scale  (B BK with no prosthetics)  General Comments  General comments (skin integrity, edema, etc.) 3L O2  PT - End of Session  Equipment Utilized During Treatment Oxygen  Activity Tolerance Treatment limited secondary to medical complications (Comment)  Patient left in bed;with call bell/phone within reach;with bed alarm set  Nurse Communication Need for lift equipment;Mobility status   PT - Assessment/Plan  PT Plan Current plan remains appropriate  PT Visit Diagnosis Muscle weakness (generalized) (M62.81);Other abnormalities of gait and mobility (R26.89);Pain  Pain - Right/Left Right  Pain - part of body Arm;Leg  PT Frequency (ACUTE ONLY) Min 3X/week  Follow Up Recommendations Home health PT;Supervision/Assistance - 24 hour  PT equipment None recommended by PT  AM-PAC PT "6 Clicks" Mobility Outcome Measure (Version 2)  Help needed turning from your back to your side while in a flat bed without using bedrails?  2  Help needed moving from lying on your back to sitting on the side of a flat bed without using bedrails? 2  Help needed moving to and from a bed to a chair  (including a wheelchair)? 1  Help needed standing up from a chair using your arms (e.g., wheelchair or bedside chair)? 1  Help needed to walk in hospital room? 1  Help needed climbing 3-5 steps with a railing?  1  6 Click Score 8  Consider Recommendation of Discharge To: CIR/SNF/LTACH  PT Goal Progression  Progress towards PT goals Progressing toward goals  PT Time Calculation  PT Start Time (ACUTE ONLY) [id number]  PT Stop Time (ACUTE ONLY) 1003  PT Time Calculation (min) (ACUTE ONLY) 25 min  PT General Charges  $$ ACUTE PT VISIT 1 Visit  PT Treatments  $Therapeutic Activity 23-37 mins    Mee Hives, PT MS Acute Rehab Dept. Number: [place] [facility] [place]

## 2019-10-29 NOTE — Progress Notes (Signed)
[facility] Progress Note   Subjective:  Seen in room - feeling better today. Doesn't really remember much regarding regarding saying that he wanted today - mood improved today. He is hoping to d/c after HD today if possible.  Objective Vitals:   10/29/19 0456 10/29/19 0720 10/29/19 0825 10/29/19 0900  BP:  108/60  (!) 103/59  Pulse:    76  Resp:  (!) 23 18 (!) 21  Temp: 97.7 F (36.5 C) 97.9 F (36.6 C)    TempSrc: Oral Oral    SpO2:   96%   Weight:      Height:       Physical Exam General:Chronically ill appearing man, NAD Heart:RRR; 2/6 murmur Lungs:CTA anteriorly Abdomen:soft, non-tender Extremities:L BKA without edema, R BKA bandaged/dry without edema Dialysis Access:LUE AVF + thrill  Additional Objective Labs: Basic Metabolic Panel: Recent Labs  Lab 10/23/19 0347  10/27/19 0104 10/28/19 0213 10/29/19 0301  NA 134*   < > 135 137 138  K 4.0   < > 5.0 4.0 4.1  CL 95*   < > 94* 96* 95*  CO2 27   < > 20* 23 27  GLUCOSE 149*   < > 158* 137* 172*  BUN 29*   < > 35* 22 34*  CREATININE 4.18*   < > 5.22* 3.41* 4.18*  CALCIUM 7.7*   < > 8.4* 8.3* 8.4*  PHOS 4.3  --   --   --   --    < > = values in this interval not displayed.   Liver Function Tests: Recent Labs  Lab 10/22/19 1931 10/23/19 0347  AST 39  --   ALT 17  --   ALKPHOS 126  --   BILITOT 2.3*  --   PROT 5.4*  --   ALBUMIN 1.9* 1.8*   CBC: Recent Labs  Lab 10/22/19 1931  10/26/19 0730 10/26/19 1907 10/27/19 0104 10/28/19 0213 10/29/19 0301  WBC 12.5*   < > 12.3* 10.3 7.6 7.9 12.4*  NEUTROABS 9.8*  --   --   --   --   --   --   HGB 10.6*   < > 10.7* 9.7* 10.1* 9.3* 9.9*  HCT 32.1*   < > 32.0* 29.9* 31.0* 28.1* 30.2*  MCV 91.7   < > 91.4 92.6 92.5 91.5 92.6  PLT 191   < > 156 161 184 143* 166   < > = values in this interval not displayed.   CBG: Recent Labs  Lab 10/28/19 1203 10/28/19 1620 10/28/19 2015 10/28/19 2243 10/29/19 0820  GLUCAP 141* 152* 142* 146*  170*   Studies/Results: Dg Chest 1 View  Result Date: 10/28/2019 CLINICAL DATA:  Shortness of breath EXAM: CHEST  1 VIEW COMPARISON:  10/28/2019 FINDINGS: Right pacer remains in place, unchanged. Prior aortic valve repair. Cardiomegaly. Tortuous, calcified aorta. Diffuse airspace disease throughout the right lung is similar to prior study. No confluent opacity on the left. Small right effusion. No acute bony abnormality. Study is limited by patient rotation. IMPRESSION: Diffuse airspace disease and volume loss on the right. This could reflect pneumonia. No change since prior study. Cardiomegaly. Electronically Signed   By: Rolm Baptise M.D.   On: 10/28/2019 19:59   Dg Chest Port 1 View  Result Date: 10/28/2019 CLINICAL DATA:  Cough EXAM: PORTABLE CHEST 1 VIEW COMPARISON:  October 22, 2019 FINDINGS: Low lung volumes. Right pleural effusion and adjacent atelectasis/consolidation. Mild left basilar atelectasis. Chronic interstitial prominence. Stable cardiomediastinal  contours. 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 IMPRESSION: Right pleural effusion with adjacent atelectasis/consolidation. Mild left basilar atelectasis. Cardiomegaly. Electronically Signed   By: Macy Mis M.D.   On: 10/28/2019 10:53   Medications: . ampicillin-sulbactam (UNASYN) IV    . vancomycin Stopped (10/27/19 1617)  . vancomycin     . acetaminophen  1,000 mg Oral TID  . apixaban  2.5 mg Oral BID  . atorvastatin  80 mg Oral q1800  . Chlorhexidine Gluconate Cloth  6 each Topical Q0600  . cinacalcet  30 mg Oral Once per day on Mon Tue Thu Sat  . darbepoetin (ARANESP) injection - DIALYSIS  60 mcg Intravenous Q Tue-HD  . feeding supplement (NEPRO CARB STEADY)  237 mL Oral BID BM  . feeding supplement (PRO-STAT SUGAR FREE 64)  30 mL Oral BID  . insulin aspart  0-6 Units Subcutaneous TID WC  . insulin detemir  10 Units Subcutaneous QHS  . midodrine  10 mg Oral Once per day  on Mon Tue Thu Sat  . midodrine  2.5 mg Oral Once per day on Mon Tue Thu Sat  . midodrine  5 mg Oral 3 times per day on Sun Wed Fri  . mometasone-formoterol  2 puff Inhalation BID  . polyethylene glycol  17 g Oral Daily  . senna  1 tablet Oral Daily  . sevelamer carbonate  3,200 mg Oral TID WC  . sodium chloride flush  3 mL Intravenous Q12H  . vancomycin variable dose per unstable renal function (pharmacist dosing)   Does not apply See admin instructions    Dialysis Orders: [facility] 4 hours, 200NRe, BFR 400, BFR 800, EDW 97kg, 2K/2.25 Ca, UF profile 2, no heparin -Hectorol 1 mcg IV q HD -Mircera 78mcg IV q 2 weeks- last dose 11/24  Assessment/Plan: 1. Cellulitis/osteomyelitis L foot:S/p R BKA 11/20 by vascular surgery.OnVanc/Ceftriaxone- changed today to Vanc/Unasyn - WBC up slightly. 2. ESRD:UsualMTTSschedule - HD today. 3. Hypertension/volume:Chronically hypotensive - on midodrine. R pleural effusion - UF as tolerated. 4. Anemia:Hgb9.9. Aranesp 60mg given [id number]. 5. Metabolic bone disease:CorrectedCa slightly high, holdinghectorol.Continue sensipar.Phosstable, continue home Renvela. 6. Nutrition:Renal/carb modified diet with fluid restrictions.Continue pro-stat + Nepro. 7. PermanentA-Fib/ heart block:rate controlled/slightly bradycardic. Has pacemaker.  8. COPD: On O2 3L at baseline. 9.T2DM: Insulin per primary.  Veneta Penton, PA-C 10/29/2019, 10:13 AM  [employer] Pager: [phone]

## 2019-10-29 NOTE — Progress Notes (Signed)
Pharmacy Antibiotic Note  Jeffrey [name] is a 78 y.o. 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  Pharmacy has been consulted for Vancomycin dosing.  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 - Afebrile. WBC 7.9>>12.4 today.  11/28: Pre HD level 21.  11/26 Vancomycin >> 11/26 Ceftriaxone >>.12/3 12/3 Unasyn >>  11/26 BCx: negative 11/26: COVID negative  Plan: - Scheduled vanc 1g IV qHD MTThS - Rocephin changed to Unasyn 3g IV q 24h (dose ok for ESRD) - No Vanco levels as discharge imminent with completion of course   Height: 5\' 11"  (180.3 cm) Weight: 220 lb 10.9 oz (100.1 kg) IBW/kg (Calculated) : 75.3  Temp (24hrs), Avg:98.1 F (36.7 C), Min:97.7 F (36.5 C), Max:98.3 F (36.8 C)  Recent Labs  Lab 10/22/19 1931 10/22/19 2158  10/24/19 0209  10/26/19 0730 10/26/19 1907 10/27/19 0104 10/28/19 0213 10/29/19 0301  WBC 12.5*  --    < > 12.7*   < > 12.3* 10.3 7.6 7.9 12.4*  CREATININE 4.09*  --    < > 4.89*  --  4.77*  --  5.22* 3.41* 4.18*  LATICACIDVEN 2.1* 1.0  --   --   --   --   --   --   --   --   VANCORANDOM  --   --   --  21  --   --   --   --   --   --    < > = values in this interval not displayed.    Estimated Creatinine Clearance: 17 mL/min (A) (by C-G formula based on SCr of 4.18 mg/dL (H)).    Allergies  Allergen Reactions  . Byetta 10 Mcg Pen [Exenatide] Diarrhea and Nausea And Vomiting  . Telmisartan Other (See Comments)    Unknown reaction - reported by [facility]  . Codeine Itching    Minor reaction  . Coumadin [Warfarin Sodium] Rash    (wife states coumadin was stopped but rash did not disappear 02/21/19)    Adriel Kessen S. Alford Highland, PharmD, BCPS Clinical Staff Pharmacist Eilene Ghazi [place] 10/29/2019 9:10 AM

## 2019-10-29 NOTE — Progress Notes (Signed)
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  Initially given Vanco/cefepime in ED but declined admission to go home for Thanksgiving, went for HD on Thursday and return to ED on 11/26 for further management.  Nephrology consulted for HD and vascular surgery consulted.  Status post right BKA 11/30 with Dr. Scot Dock  A & P   Principal Problem:   Osteomyelitis of right foot ([place]) Active Problems:   ESRD (end stage renal disease) on dialysis [facility])   Atherosclerosis of native coronary artery of native heart with angina pectoris ([place])   Chronic combined systolic and diastolic CHF (congestive heart failure) (HCC)   Hyperlipidemia   COPD (chronic obstructive pulmonary disease) (HCC)   Chronic respiratory failure with hypoxia (HCC)   Chronic hypotension   Complete heart block (HCC)   Presence of permanent cardiac pacemaker   PVD (peripheral vascular disease) (HCC)   Unstageable pressure ulcer of sacral region ([place])    Acute hypoxic respiratory failure secondary to right pleural effusion, concern for developing aspiration pneumonia Acute hypoxia resolved, WBC 7.9-> 12.4 today despite vancomycin/ceftriaxone, afebrile but on  scheduled Tylenol Chest x-ray 12/2 concerning for right lower lobe pneumonia  MRSA nares negative SLP recommending modified barium swallow however patient refusing -Discontinue vancomycin and Rocephin given developing aspiration pneumonia and negative MRSA nares and change to better anaerobic coverage with unasyn and cover for atypicals with doxycycline (prolonged QT, hold azithromycin) -this plan was discussed with ID -If improvement with above regimen, can discharge on PO augmentin for total 7 days and doxycycline for total 3 days starting from 12/3 -Discontinue Tylenol to evaluate for fever  -Aspiration precautions -SLP on board  Suspected osteomyelitis of the second distal phalanx of right foot with overlying cellulitis, status post right BKA 11/30 with Dr. Scot Dock, POD 3 Day 8 antibiotics, currently vancomycin/Rocephin however now with leukocytosis Pain regimen: Tylenol 1000 mg 3 times daily, Robaxin added last night with improvement, Dilaudid 0.5 mg every 4 hours as needed (did not tolerate every 6 hours.) -Antibiotic changes as above -Discontinue tylenol as above -Continue Robaxin and Dilaudid. Avoid oxycodone and morphine given that he is ESRD on HD and these meds can build up significantly in renal failure.   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 -Renally dosed Eliquis restarted -Telemetry   COPD with acute on chronic chronic hypoxic respiratory failure on  3 L nasal cannula at baseline As above -Titrate oxygen as needed -Continue nebulizers   Sacral Pressure ulcer noted by nursing on presentation -Wound care  Diabetes with hypoglycemia in setting of insulin and renal insufficiency Hypoglycemia resolved, stable HA1C 6.8 -Continue Levemir to 10 units at nighttime and continue sliding scale  CAD status post PCI Stable -Hold Lopressor and Plavix for now  Hyperlipidemia Stable -Continue atorvastatin  Anxiety and depression Reported: "I just want to die" -Xanax changed to Ativan last night as patient was acutely anxious from pain but did not require benzo -Cleared by psych, no imminent risk  DVT prophylaxis: Eliquis Diet: Renal, carb modified, fluid restriction Family Communication: Wife did not answer phone Disposition Plan: Pending clinical stability.  Home health at discharge.  Barriers to discharge are developing aspiration pneumonia as above  Consultants  Vascular surgery Nephrology  Procedures  Status post right BKA 11/30  Antibiotics   Anti-infectives (From admission, onward)   Start     Dose/Rate Route Frequency Ordered Stop   10/29/19 1000  Ampicillin-Sulbactam (UNASYN) 3 g in sodium chloride 0.9 % 100 mL IVPB    Note to Pharmacy: Unasyn 3 g IV q24h for ESRD on HD   3 g 200 mL/hr over 30 Minutes Intravenous Daily 10/29/19 0737     10/26/19 1200  vancomycin (VANCOCIN) IVPB 1000 mg/200 mL premix  Status:  Discontinued     1,000 mg 200 mL/hr over 60 Minutes Intravenous Every Mon (Hemodialysis) 10/24/19 0721 10/29/19 1144   10/24/19 1200  vancomycin (VANCOCIN) IVPB 1000 mg/200 mL premix  Status:  Discontinued     1,000 mg 200 mL/hr over 60 Minutes Intravenous Every T-Th-Sa (Hemodialysis) 10/24/19 0721 10/29/19 1144   10/23/19 1800  cefTRIAXone (ROCEPHIN) 2 g in sodium chloride 0.9 % 100 mL IVPB  Status:  Discontinued     2 g 200 mL/hr over 30 Minutes Intravenous Every 24 hours 10/22/19 2323 10/29/19 0732   10/23/19 0000  vancomycin variable dose per unstable renal function (pharmacist dosing)  Status:   Discontinued      Does not apply See admin instructions 10/22/19 1958 10/29/19 1144   10/22/19 1945  vancomycin (VANCOCIN) IVPB 1000 mg/200 mL premix     1,000 mg 200 mL/hr over 60 Minutes Intravenous  Once 10/22/19 1931 10/22/19 2250   10/22/19 1945  cefTRIAXone (ROCEPHIN) 2 g in sodium chloride 0.9 % 100 mL IVPB     2 g 200 mL/hr over 30 Minutes Intravenous  Once 10/22/19 1931 10/22/19 2138           Subjective   Patient seen and examined at bedside sitting upright and resting comfortably.  States his pain is significantly improved.  Denies any complaints at this time.  He wants to go home today.   Objective   Vitals:   10/29/19 0456 10/29/19 0720 10/29/19 0825 10/29/19 0900  BP:  108/60  (!) 103/59  Pulse:    76  Resp:  (!) 23 18 (!) 21  Temp: 97.7 F (36.5 C) 97.9 F (36.6 C)    TempSrc: Oral Oral    SpO2:   96%   Weight:      Height:        Intake/Output Summary (Last 24 hours) at 10/29/2019 1145 Last data filed at 10/28/2019 1700 Gross per 24 hour  Intake 120 ml  Output -  Net 120 ml   Filed Weights   10/27/19 1653 10/28/19 0444 10/28/19 1520  Weight: 100.9 kg 98.4 kg 100.1  kg    Examination:  Physical Exam Vitals signs and nursing note reviewed.  Constitutional:      Appearance: He is obese.     Comments: Chronically ill-appearing  HENT:     Head: Normocephalic and atraumatic.     Mouth/Throat:     Mouth: Mucous membranes are moist.  Eyes:     Extraocular Movements: Extraocular movements intact.  Pulmonary:     Effort: Pulmonary effort is normal. No respiratory distress.     Comments: Right basilar rales Abdominal:     General: Abdomen is flat.     Palpations: Abdomen is soft.  Musculoskeletal:     Comments: Status post bilateral BKA  Neurological:     Mental Status: He is alert.  Psychiatric:     Comments: Poor insight     Data Reviewed: I have personally reviewed following labs and imaging studies  CBC: Recent Labs  Lab 10/22/19  1931  10/26/19 0730 10/26/19 1907 10/27/19 0104 10/28/19 0213 10/29/19 0301  WBC 12.5*   < > 12.3* 10.3 7.6 7.9 12.4*  NEUTROABS 9.8*  --   --   --   --   --   --   HGB 10.6*   < > 10.7* 9.7* 10.1* 9.3* 9.9*  HCT 32.1*   < > 32.0* 29.9* 31.0* 28.1* 30.2*  MCV 91.7   < > 91.4 92.6 92.5 91.5 92.6  PLT 191   < > 156 161 184 143* 166   < > = values in this interval not displayed.   Basic Metabolic Panel: Recent Labs  Lab 10/23/19 0347 10/24/19 0209 10/26/19 0730 10/27/19 0104 10/28/19 0213 10/29/19 0301  NA 134* 136 132* 135 137 138  K 4.0 5.1 4.1 5.0 4.0 4.1  CL 95* 94* 92* 94* 96* 95*  CO2 27 24 25  20* 23 27  GLUCOSE 149* 60* 122* 158* 137* 172*  BUN 29* 35* 30* 35* 22 34*  CREATININE 4.18* 4.89* 4.77* 5.22* 3.41* 4.18*  CALCIUM 7.7* 8.4* 8.1* 8.4* 8.3* 8.4*  MG  --   --  1.5*  --   --  2.0  PHOS 4.3  --   --   --   --   --    GFR: Estimated Creatinine Clearance: 17 mL/min (A) (by C-G formula based on SCr of 4.18 mg/dL (H)). Liver Function Tests: Recent Labs  Lab 10/22/19 1931 10/23/19 0347  AST 39  --   ALT 17  --   ALKPHOS 126  --   BILITOT 2.3*  --   PROT 5.4*  --   ALBUMIN 1.9* 1.8*   No results for input(s): LIPASE, AMYLASE in the last 168 hours. No results for input(s): AMMONIA in the last 168 hours. Coagulation Profile: Recent Labs  Lab 10/22/19 1931 10/26/19 0730 10/26/19 2016  INR 3.2* 2.0* 1.5*   Cardiac Enzymes: No results for input(s): CKTOTAL, CKMB, CKMBINDEX, TROPONINI in the last 168 hours. BNP (last 3 results) No results for input(s): PROBNP in the last 8760 hours. HbA1C: No results for input(s): HGBA1C in the last 72 hours. CBG: Recent Labs  Lab 10/28/19 1203 10/28/19 1620 10/28/19 2015 10/28/19 2243 10/29/19 0820  GLUCAP 141* 152* 142* 146* 170*   Lipid Profile: No results for input(s): CHOL, HDL, LDLCALC, TRIG, CHOLHDL, LDLDIRECT in the last 72 hours. Thyroid Function Tests: No results for input(s): TSH, T4TOTAL, FREET4,  T3FREE, THYROIDAB in the last 72 hours. Anemia Panel: No results for input(s): VITAMINB12, FOLATE, FERRITIN, TIBC, IRON, RETICCTPCT  in the last 72 hours. Sepsis Labs: Recent Labs  Lab 10/22/19 1931 10/22/19 2158  LATICACIDVEN 2.1* 1.0    Recent Results (from the past 240 hour(s))  Culture, blood (Routine x 2)     Status: None   Collection Time: 10/21/19  4:08 PM   Specimen: BLOOD RIGHT ARM  Result Value Ref Range Status   Specimen Description BLOOD RIGHT ARM UPPER  Final   Special Requests   Final    BOTTLES DRAWN AEROBIC ONLY Blood Culture adequate volume   Culture   Final    NO GROWTH 5 DAYS Performed at [facility], [address], [place], [place] [address]    Report Status 10/26/2019 FINAL  Final  Blood Culture (routine x 2)     Status: None   Collection Time: 10/22/19  8:36 PM   Specimen: BLOOD RIGHT WRIST  Result Value Ref Range Status   Specimen Description BLOOD RIGHT WRIST  Final   Special Requests   Final    BOTTLES DRAWN AEROBIC AND ANAEROBIC Blood Culture adequate volume   Culture   Final    NO GROWTH 5 DAYS Performed at [facility], [place] [address], [place], [place] [address]    Report Status 10/27/2019 FINAL  Final  Culture, blood (Routine x 2)     Status: None   Collection Time: 10/22/19  9:58 PM   Specimen: BLOOD RIGHT HAND  Result Value Ref Range Status   Specimen Description BLOOD RIGHT HAND POST VANC/ROCEPHIN  Final   Special Requests   Final    BOTTLES DRAWN AEROBIC AND ANAEROBIC Blood Culture adequate volume   Culture   Final    NO GROWTH 5 DAYS Performed at [facility], [place] [address], [place], [place] [address]    Report Status 10/27/2019 FINAL  Final  SARS CORONAVIRUS 2 (TAT 6-24 HRS) Nasopharyngeal Nasopharyngeal Swab     Status: None   Collection Time: 10/22/19 10:55 PM   Specimen: Nasopharyngeal Swab  Result Value Ref Range Status   SARS Coronavirus 2 NEGATIVE NEGATIVE Final    Comment: (NOTE) SARS-CoV-2 target  nucleic acids are NOT DETECTED. The SARS-CoV-2 RNA is generally detectable in upper and lower respiratory specimens during the acute phase of infection. Negative results do not preclude SARS-CoV-2 infection, do not rule out co-infections with other pathogens, and should not be used as the sole basis for treatment or other patient management decisions. Negative results must be combined with clinical observations, patient history, and epidemiological information. The expected result is Negative. Fact Sheet for Patients: SugarRoll.be Fact Sheet for Healthcare Providers: https://www.woods-mathews.com/ This test is not yet approved or cleared by the [place] FDA and  has been authorized for detection and/or diagnosis of SARS-CoV-2 by FDA under an Emergency Use Authorization (EUA). This EUA will remain  in effect (meaning this test can be used) for the duration of the COVID-19 declaration under Section 56 4(b)(1) of the Act, 21 U.S.C. section 360bbb-3(b)(1), unless the authorization is terminated or revoked sooner. Performed at [facility], [place] [address], [place],  [address]   MRSA PCR Screening     Status: None   Collection Time: 10/28/19  2:51 PM   Specimen: Nasopharyngeal  Result Value Ref Range Status   MRSA by PCR NEGATIVE NEGATIVE Final    Comment:        The GeneXpert MRSA Assay (FDA approved for NASAL specimens only), is one component of a comprehensive MRSA colonization surveillance  program. It is not intended to diagnose MRSA infection nor to guide or monitor treatment for MRSA infections. Performed at [facility], [place] [address] [place],  [address]          Radiology Studies: Dg Chest 1 View  Result Date: 10/28/2019 CLINICAL DATA:  Shortness of breath EXAM: CHEST  1 VIEW COMPARISON:  10/28/2019 FINDINGS: Right pacer remains in place, unchanged. Prior aortic valve repair. Cardiomegaly.  Tortuous, calcified aorta. Diffuse airspace disease throughout the right lung is similar to prior study. No confluent opacity on the left. Small right effusion. No acute bony abnormality. Study is limited by patient rotation. IMPRESSION: Diffuse airspace disease and volume loss on the right. This could reflect pneumonia. No change since prior study. Cardiomegaly. Electronically Signed   By: Rolm Baptise M.D.   On: 10/28/2019 19:59   Dg Chest Port 1 View  Result Date: 10/28/2019 CLINICAL DATA:  Cough EXAM: PORTABLE CHEST 1 VIEW COMPARISON:  October 22, 2019 FINDINGS: Low lung volumes. Right pleural effusion and adjacent atelectasis/consolidation. Mild left basilar atelectasis. Chronic interstitial prominence. Stable cardiomediastinal contours. 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 IMPRESSION: Right pleural effusion with adjacent atelectasis/consolidation. Mild left basilar atelectasis. Cardiomegaly. Electronically Signed   By: Macy Mis M.D.   On: 10/28/2019 10:53        Scheduled Meds: . acetaminophen  1,000 mg Oral TID  . apixaban  2.5 mg Oral BID  . atorvastatin  80 mg Oral q1800  . Chlorhexidine Gluconate Cloth  6 each Topical Q0600  . cinacalcet  30 mg Oral Once per day on Mon Tue Thu Sat  . darbepoetin (ARANESP) injection - DIALYSIS  60 mcg Intravenous Q Tue-HD  . feeding supplement (NEPRO CARB STEADY)  237 mL Oral BID BM  . feeding supplement (PRO-STAT SUGAR FREE 64)  30 mL Oral BID  . insulin aspart  0-6 Units Subcutaneous TID WC  . insulin detemir  10 Units Subcutaneous QHS  . midodrine  10 mg Oral Once per day on Mon Tue Thu Sat  . midodrine  2.5 mg Oral Once per day on Mon Tue Thu Sat  . midodrine  5 mg Oral 3 times per day on Sun Wed Fri  . mometasone-formoterol  2 puff Inhalation BID  . polyethylene glycol  17 g Oral Daily  . senna  1 tablet Oral Daily  . sevelamer carbonate  3,200 mg Oral TID WC  . sodium chloride  flush  3 mL Intravenous Q12H   Continuous Infusions: . ampicillin-sulbactam (UNASYN) IV 3 g (10/29/19 1051)     LOS: 7 days    Time spent: 30 minutes with over 50% of the time coordinating the patient's care    Harold Hedge, DO [facility] Pager [phone]  If 7PM-7AM, please contact night-coverage [email or web address] Password [email or web address] 10/29/2019, 11:45 AM

## 2019-10-30 ENCOUNTER — Inpatient Hospital Stay (HOSPITAL_COMMUNITY): Payer: Medicare Other

## 2019-10-30 DIAGNOSIS — J9611 Chronic respiratory failure with hypoxia: Secondary | ICD-10-CM

## 2019-10-30 DIAGNOSIS — N186 End stage renal disease: Secondary | ICD-10-CM

## 2019-10-30 DIAGNOSIS — M86171 Other acute osteomyelitis, right ankle and foot: Secondary | ICD-10-CM

## 2019-10-30 DIAGNOSIS — I25119 Atherosclerotic heart disease of native coronary artery with unspecified angina pectoris: Secondary | ICD-10-CM

## 2019-10-30 DIAGNOSIS — I5042 Chronic combined systolic (congestive) and diastolic (congestive) heart failure: Secondary | ICD-10-CM

## 2019-10-30 DIAGNOSIS — I9589 Other hypotension: Secondary | ICD-10-CM

## 2019-10-30 DIAGNOSIS — J410 Simple chronic bronchitis: Secondary | ICD-10-CM

## 2019-10-30 DIAGNOSIS — Z992 Dependence on renal dialysis: Secondary | ICD-10-CM

## 2019-10-30 DIAGNOSIS — E782 Mixed hyperlipidemia: Secondary | ICD-10-CM

## 2019-10-30 DIAGNOSIS — I739 Peripheral vascular disease, unspecified: Secondary | ICD-10-CM

## 2019-10-30 LAB — BASIC METABOLIC PANEL
Anion gap: 18 — ABNORMAL HIGH (ref 5–15)
BUN: 21 mg/dL (ref 8–23)
CO2: 22 mmol/L (ref 22–32)
Calcium: 8.5 mg/dL — ABNORMAL LOW (ref 8.9–10.3)
Chloride: 95 mmol/L — ABNORMAL LOW (ref 98–111)
Creatinine, Ser: 3.04 mg/dL — ABNORMAL HIGH (ref 0.61–1.24)
GFR calc Af Amer: 21 mL/min — ABNORMAL LOW (ref >60)
GFR calc non Af Amer: 18 mL/min — ABNORMAL LOW (ref >60)
Glucose, Bld: 117 mg/dL — ABNORMAL HIGH (ref 70–99)
Potassium: 3.6 mmol/L (ref 3.5–5.1)
Sodium: 135 mmol/L (ref 135–145)

## 2019-10-30 LAB — CBC
HCT: 33.5 % — ABNORMAL LOW (ref 39.0–52.0)
Hemoglobin: 10.7 g/dL — ABNORMAL LOW (ref 13.0–17.0)
MCH: 30.2 pg (ref 26.0–34.0)
MCHC: 31.9 g/dL (ref 30.0–36.0)
MCV: 94.6 fL (ref 80.0–100.0)
Platelets: 175 10*3/uL (ref 150–400)
RBC: 3.54 MIL/uL — ABNORMAL LOW (ref 4.22–5.81)
RDW: 19.3 % — ABNORMAL HIGH (ref 11.5–15.5)
WBC: 11.8 10*3/uL — ABNORMAL HIGH (ref 4.0–10.5)
nRBC: 0.3 % — ABNORMAL HIGH (ref 0.0–0.2)

## 2019-10-30 LAB — GLUCOSE, CAPILLARY
Glucose-Capillary: 79 mg/dL (ref 70–99)
Glucose-Capillary: 81 mg/dL (ref 70–99)

## 2019-10-30 LAB — MAGNESIUM: Magnesium: 1.9 mg/dL (ref 1.7–2.4)

## 2019-10-30 MED ORDER — NEPRO/CARBSTEADY PO LIQD: 237.0000 mL | Freq: Two times a day (BID) | ORAL | 0 refills | Status: AC

## 2019-10-30 MED ORDER — DOXYCYCLINE HYCLATE 100 MG PO TABS: 100.0000 mg | ORAL_TABLET | Freq: Two times a day (BID) | ORAL | 0 refills | Status: DC

## 2019-10-30 MED ORDER — DOXYCYCLINE HYCLATE 100 MG PO TABS: 100.0000 mg | ORAL_TABLET | Freq: Two times a day (BID) | ORAL | 0 refills | Status: AC

## 2019-10-30 MED ORDER — PRO-STAT SUGAR FREE PO LIQD: 30.0000 mL | Freq: Two times a day (BID) | ORAL | 0 refills | Status: AC

## 2019-10-30 MED ORDER — AMOXICILLIN-POT CLAVULANATE 875-125 MG PO TABS: 1.0000 | ORAL_TABLET | Freq: Two times a day (BID) | ORAL | 0 refills | Status: AC

## 2019-10-30 MED ORDER — NEPRO/CARBSTEADY PO LIQD: 237.0000 mL | Freq: Two times a day (BID) | ORAL | 0 refills | Status: DC

## 2019-10-30 MED ORDER — PRO-STAT SUGAR FREE PO LIQD: 30.0000 mL | Freq: Two times a day (BID) | ORAL | 0 refills | Status: DC

## 2019-10-30 NOTE — TOC Transition Note (Addendum)
Transition of Care [facility]) - CM/SW Discharge Note   Patient Details  Name: Jeffrey [name] MRN: [id number] Date of Birth: Jul 22, 1941  [facility] CM/SW Contact:  Sharin Mons, RN Phone Number: 11/20/2019, 11:17 AM   Clinical Narrative:    Presented with R foot wound, hx of  ESRD onMTThSHD, CAD s/p PCI, ASs/p TAVR, CHB s/p PPM, permanent A. fib on Eliquis, PVD s/p left BKA , COPD/chronic respiratory failure on 3L O2 via [place], CHF,DM,chronic hypotension, hyperlipidemia,CVA.               S/p  RIGHT BELOW KNEE AMPUTATION (Right Knee), 11/30.  Pt will transition to home today with wife and son. Pt and wife both adamantly declines SNF. Wife states she and son will care for pt. Jeffrey [name] (Spouse) Jeffrey [name] [facility])    [phone]     Pt with have home health services provided by [facility], [facility] within 48 hrs post d/c. Wife states she and her son will provide transportation to home and will bring portable oxygen tank for ride home.  Final next level of care: Home w Home Health Services Barriers to Discharge: No Barriers Identified   Patient Goals and CMS Choice   CMS Medicare.gov Compare Post Acute Care list provided to:: Patient Choice offered to / list presented to : Patient  Discharge Placement                       Discharge Plan and Services   Discharge Planning Services: CM Consult     HH Arranged: PT, RN, NA,SW [place]: [place] ([place]) Date [place]: 10/28/19 Time [place]: [phone] Representative spoke with at [place]: [place] ([place]) Interventions     Readmission Risk Interventions No flowsheet data found.

## 2019-10-30 NOTE — Discharge Summary (Signed)
Physician Discharge Summary  KADON RAUCH [id number] DOB: December 25, 1940 DOA: 10/22/2019  PCP: Ronita Hipps, MD  Admit date: 10/22/2019 Discharge date: 11/01/2019  Admitted From: Home Disposition: Home with [facility]  Recommendations for Outpatient Follow-up:  1. Follow up with PCP in 1-2 weeks, Follow up with Vascular Surgery in 3-4 weeks as recommended 2. Please obtain BMP/CBC in one week   Home Health:Yes Equipment/Devices: No new devices, patient and his wife reported having equipment at home.  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  Initially given Vanco/cefepime in ED but declined admission to go home for Thanksgiving, went for HD on Thursday and return to ED on 11/26 for further management.  Nephrology consulted for HD and vascular surgery consulted.  Status post right BKA 11/30 with Dr. Lurline Idol course: Acute hypoxic respiratory failure secondary to right pleural effusion, concern for developing aspiration pneumonia Acute hypoxia resolved, WBC 7.9-> 12.4 improved with antibiotics.  chest x-ray 12/2 concerning for right lower lobe pneumonia  MRSA nares negative SLP recommending modified barium swallow however patient refused.   -Discontinued vancomycin and Rocephin given developing aspiration pneumonia and negative MRSA nares and change to better anaerobic coverage with unasyn and cover for atypicals with doxycycline (prolonged QT, hold azithromycin) -this plan was discussed with ID -Patient was treated with Unasyn and doxycycline.  He will be transition to Augmentin x7 days and doxycycline x 3days after discharge.  Suspected osteomyelitis of the second distal phalanx of right foot with overlying cellulitis, status post right BKA 11/30 with Dr. Scot Dock, POD 3 -His pain is well controlled. Continue Tylenol as needed.  -Avoid oxycodone and morphine given that he is ESRD on HD and these meds can build up significantly in renal failure.   -PT/OT recommending home health PT which was ordered.  -Follow up with palliative outpatient  Right upper extremity edema -Improved. 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   Expiratory wheeze Resolved   Chronic hypotension with volume overload Stable -keep SBP > 80 -Continue midodrine   Atrial fibrillation with bradycardia Complete heart block status post PPM hemoynamically stable CHA2DS2-VASc: 7, chronically on Eliquis TSH unremarkable. -Renally dosed Eliquis restarted    COPD with acute on chronic chronic hypoxic respiratory failure on 3 L nasal cannula at baseline As above Completed treatment  Sacral Pressure ulcer noted by nursing on presentation -Stage I and II wounds present on admission.  -wound care to be continued at home with foam dressings and or Desitin per wound care home health nurse.   Diabetes with hypoglycemia in setting of insulin and renal insufficiency Hypoglycemia resolved, stable HA1C 6.8 -Continue his home regimen  CAD status post PCI Stable -Resume his  home medications including  Plavix  Hyperlipidemia Stable -Continue atorvastatin  Anxiety and depression Reported: "I just want to die" -Xanax changed to Ativan as patient was acutely anxious from pain but did not require benzo -Cleared by psych, no imminent risk -He will resume his home medication  DVT prophylaxis: Eliquis Diet: Renal Family Communication:  Wife over the phone Disposition Plan:  Home with home health services Consultants  Vascular surgery Nephrology  Procedures  Status post right BKA 11/30  Antibiotics   Received vancomycin and Rocephin. Was treated with Unasyn and doxycycline.  Continue p.o. Augmentin and doxycycline after discharge  Discharge Diagnoses:  Principal Problem:   Osteomyelitis of right foot ([place]) Active Problems:   ESRD (end stage renal disease) on dialysis [facility])   Atherosclerosis of native coronary artery of native heart with angina pectoris (HCC)   Chronic combined systolic and diastolic CHF (congestive heart failure) (HCC)   Hyperlipidemia   COPD (chronic obstructive pulmonary disease) (HCC)   Chronic respiratory failure with hypoxia (HCC)   Chronic hypotension   Complete heart block (HCC)   Presence of permanent cardiac pacemaker   PVD (peripheral vascular disease) (HCC)   Unstageable pressure ulcer of sacral region [facility])    Discharge Instructions  Discharge Instructions    Call MD for:   Complete by: As directed    Follow up with the Vascular Surgery Team as previously discussed   Diet - low sodium heart healthy   Complete by: As directed    Increase activity slowly   Complete by: As directed      Allergies as of 11/01/2019      Reactions   Byetta 10 Mcg Pen [exenatide] Diarrhea, Nausea And Vomiting   Telmisartan Other (See Comments)   Unknown reaction - reported by [facility]   Codeine Itching   Minor reaction   Coumadin [warfarin Sodium] Rash   (wife states coumadin was stopped but rash did not disappear 02/21/19)      Medication List     TAKE these medications   acetaminophen 500 MG tablet Commonly known as: TYLENOL Take 1 tablet (500 mg total) by mouth every 6 (six) hours as needed for mild pain or headache (pain). What changed:   how much to take  reasons to take this   albuterol 108 (90 Base) MCG/ACT inhaler Commonly known as: VENTOLIN HFA Inhale 2 puffs into the lungs every 4 (four) hours as needed for wheezing or shortness of breath.   albuterol (2.5 MG/3ML) 0.083% nebulizer solution Commonly known as: PROVENTIL Take 3 mLs (2.5 mg total) by nebulization every 6 (six) hours as needed for wheezing or shortness of breath.   ALPRAZolam 0.25 MG tablet Commonly known as: XANAX Take 1 tablet (0.25 mg total) by mouth 2 (two) times daily as needed for anxiety. What changed: reasons to take this   amoxicillin-clavulanate 875-125 MG tablet Commonly known as: Augmentin Take 1 tablet by mouth every 12 (twelve) hours for 7 days.   apixaban 2.5 MG Tabs tablet Commonly known as: ELIQUIS Take 1 tablet (2.5 mg total) by mouth 2 (two) times daily.   atorvastatin 80 MG tablet Commonly known as: LIPITOR Take 1 tablet (80 mg total) by mouth daily at 6 PM.   budesonide-formoterol 160-4.5 MCG/ACT inhaler Commonly known as: SYMBICORT Inhale 2 puffs into the lungs 2 (two) times daily as needed (shortness of breath).   cinacalcet 30 MG tablet Commonly known as: SENSIPAR Take 30 mg by mouth See admin instructions. Take one tablet (30 mg)  by mouth on Monday, Tuesday, Thursday, Saturday after dialysis   clopidogrel 75 MG tablet Commonly known as: PLAVIX Take 1 tablet (75 mg total) by mouth daily.   doxercalciferol 4 MCG/2ML injection Commonly known as: HECTOROL Inject 0.5 mLs (1 mcg total) into the vein Every Tuesday,Thursday,and Saturday with dialysis.   doxycycline 100 MG tablet Commonly known as: VIBRA-TABS Take 1 tablet (100 mg total) by mouth every 12 (twelve) hours for 2 days.   feeding supplement (NEPRO CARB  STEADY) Liqd Take 237 mLs by mouth 2 (two) times daily between meals.   feeding supplement (PRO-STAT SUGAR FREE 64) Liqd Take 30 mLs by mouth 2 (two) times daily.   Insulin Pen Needle 31G X 6 MM Misc at bedtime.   Levemir FlexTouch 100 UNIT/ML Pen Generic drug: Insulin Detemir INJECT 48 UNITS TOTAL INTO THE SKIN AT BEDTIME. What changed: See the new instructions.   lidocaine-prilocaine cream Commonly known as: EMLA Apply 1 application topically See admin instructions. Apply to port access prior to dialysis on Monday, Tuesday, Thursday, Saturday   loperamide 2 MG capsule Commonly known as: IMODIUM Take 2 mg by mouth 4 (four) times daily as needed for diarrhea or loose stools.   loratadine 10 MG tablet Commonly known as: CLARITIN Take 10 mg by mouth daily.   metoprolol tartrate 25 MG tablet Commonly known as: LOPRESSOR Take 0.5 tablets (12.5 mg total) by mouth See admin instructions. Take 1/2 tablet (12.5 mg) by mouth on Sunday, Monday, Wednesday, Friday mornings (non-dialysis days), take 1/2 tablet (12.5 mg) every evening What changed: additional instructions   midodrine 10 MG tablet Commonly known as: PROAMATINE Take 10 mg by mouth See admin instructions. Take one tablet (10 mg) by mouth 30 minutes before dialysis on Monday, Tuesday, Thursday, Saturday What changed: Another medication with the same name was changed. Make sure you understand how and when to take each.   midodrine 2.5 MG tablet Commonly known as: PROAMATINE Take 1 tablet (2.5 mg total) by mouth 3 (three) times daily with meals. Patient should continue to take 10 mg in the morning prior to hemodialysis sessions. What changed:   when to take this  additional instructions   nitroGLYCERIN 0.4 MG SL tablet Commonly known as: Nitrostat Place 1 tablet (0.4 mg total) under the tongue every 5 (five) minutes as needed for chest pain.   OXYGEN Inhale 3 L into the lungs continuous.   pantoprazole 40 MG  tablet Commonly known as: PROTONIX Take 1 tablet (40 mg total) by mouth daily.   sevelamer carbonate 800 MG tablet Commonly known as: RENVELA Take 2,400 mg by mouth See admin instructions. Take three tablets (2400 mg) by mouth once or twice daily with meals      Follow-up Information    [facility] Follow up.   Why: home health services arranged       Angelia Mould, MD Follow up in 4 week(s).   Specialties: Vascular Surgery, Cardiology Contact information: [address] [place] [address] [phone]          Allergies  Allergen Reactions  . Byetta 10 Mcg Pen [Exenatide] Diarrhea and Nausea And Vomiting  . Telmisartan Other (See Comments)    Unknown reaction - reported by [facility]  . Codeine Itching    Minor reaction  . Coumadin [Warfarin Sodium] Rash    (wife states coumadin was stopped but rash did not disappear 02/21/19)     Consultations:Nephrology. Vascular Surgery    Procedures/Studies: Dg Chest 1 View  Result Date: 10/28/2019 CLINICAL DATA:  Shortness of breath EXAM: CHEST  1 VIEW COMPARISON:  10/28/2019 FINDINGS: Right pacer remains in place, unchanged. Prior aortic valve repair. Cardiomegaly. Tortuous, calcified aorta. Diffuse airspace disease throughout the right lung is similar to prior study. No confluent opacity on the left. Small right effusion. No acute bony abnormality. Study is limited by patient rotation. IMPRESSION: Diffuse airspace disease and volume loss on the right. This could reflect pneumonia. No change since prior study. Cardiomegaly. Electronically Signed   By: Rolm Baptise M.D.   On: 10/28/2019 19:59   Dg Chest Port 1 View  Result Date: 10/27/2019 CLINICAL DATA:  Pneumonia. EXAM: PORTABLE CHEST 1 VIEW COMPARISON:  October 28, 2019. FINDINGS: Stable cardiomegaly. Atherosclerosis of thoracic aorta is noted. No pneumothorax is noted. Right-sided pacemaker is unchanged. Stable right lung opacity is noted concerning for  pneumonia or atelectasis with associated effusion. Mild left basilar atelectasis is noted. Status post transcatheter aortic valve repair. Bony thorax is unremarkable. IMPRESSION: Aortic atherosclerosis. Stable right lung opacity is noted concerning for pneumonia or atelectasis with associated effusion. Mild left basilar atelectasis is noted. Electronically Signed   By: Marijo Conception M.D.   On: 11/08/2019 08:13   Dg Chest Port 1 View  Result Date: 10/28/2019 CLINICAL DATA:  Cough EXAM: PORTABLE CHEST 1 VIEW COMPARISON:  October 22, 2019 FINDINGS: Low lung volumes. Right pleural effusion and adjacent atelectasis/consolidation. Mild left basilar atelectasis. Chronic interstitial prominence. Stable cardiomediastinal contours. 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 IMPRESSION: Right pleural effusion with adjacent atelectasis/consolidation. Mild left basilar atelectasis. Cardiomegaly. Electronically Signed   By: Macy Mis M.D.   On: 10/28/2019 10:53   Dg Chest Port 1 View  Result Date: 10/22/2019 CLINICAL DATA:  End-stage renal disease with hypertension. EXAM: PORTABLE CHEST 1 VIEW COMPARISON:  09/08/2019 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 size remains enlarged. Aortic calcifications are noted. The lung bases are only partially visualized, however there appears to be a right-sided pleural effusion with a possible right lower lobe airspace opacity. The lung apices are suboptimally evaluated secondary to patient positioning. There is no pneumothorax. The patient is status post prior TAVR. IMPRESSION: 1. Limited study as detailed above. 2. Probable right-sided pleural effusion. There is a right lower lung zone airspace opacity which may represent atelectasis or infiltrate. 3. Persistent cardiomegaly. Electronically Signed   By: Constance Holster M.D.   On: 10/22/2019 19:45   Dg Foot Complete Right  Result Date:  10/21/2019 CLINICAL DATA:  Right foot infection. EXAM: RIGHT FOOT COMPLETE - 3+ VIEW COMPARISON:  None. FINDINGS: Questionable osteolysis at the tip of second distal phalanx. No acute fracture or dislocation. Joint spaces are preserved. Mild dorsal midfoot degenerative spurring. Bone mineralization is normal. Plantar enthesophyte. No subcutaneous emphysema. Diffuse atherosclerotic vascular calcification. IMPRESSION: 1. Questionable osteolysis at the tip of the second distal phalanx, potentially reflecting osteomyelitis. Consider MRI for further evaluation. Electronically Signed   By: Titus Dubin M.D.   On: 10/21/2019 16:58    (Echo, Carotid, EGD, Colonoscopy, ERCP)    Subjective:   Discharge Exam: Vitals:   10/27/2019 0833 11/10/2019 1201  BP:  100/69  Pulse:  73  Resp:  20  Temp: 97.6 F (36.4 C) 98.4 F (36.9 C)  SpO2:  100%   Vitals:   11/08/2019 0700 10/29/2019 0815 11/25/2019 0833 11/23/2019 1201  BP:  (!) 106/59  100/69  Pulse:  76  73  Resp:  (!) 21  20  Temp:   97.6 F (36.4 C) 98.4 F (36.9 C)  TempSrc:   Oral Oral  SpO2:  95%  100%  Weight: 96 kg     Height:        General: Pt is alert, awake, not in acute distress Cardiovascular: RRR, S1/S2 + Respiratory: CTA bilaterally, no wheezing, no rhonchi Abdominal: Soft, NT, ND Extremities: bilateral BKA with right stump covered in dressing that is c/d/i   The results of significant diagnostics from this hospitalization (including imaging, microbiology, ancillary and laboratory) are listed below for reference.     Microbiology: Recent Results (from the past 240 hour(s))  Culture, blood (Routine x 2)     Status: None   Collection Time: 10/21/19  4:08 PM   Specimen: BLOOD RIGHT ARM  Result Value Ref Range Status   Specimen Description BLOOD RIGHT ARM UPPER  Final   Special Requests   Final    BOTTLES DRAWN AEROBIC ONLY Blood Culture adequate volume   Culture   Final    NO GROWTH 5 DAYS Performed at [facility], [address], [place], [place] [address]    Report Status 10/26/2019 FINAL  Final  Blood Culture (routine x 2)     Status: None   Collection Time: 10/22/19  8:36 PM   Specimen: BLOOD RIGHT WRIST  Result Value Ref Range Status   Specimen Description BLOOD RIGHT WRIST  Final   Special Requests   Final    BOTTLES DRAWN AEROBIC AND ANAEROBIC Blood Culture adequate volume   Culture   Final    NO GROWTH 5 DAYS Performed at [facility], [place] [address] [place], [place] [address]    Report Status 10/27/2019 FINAL  Final  Culture, blood (Routine x 2)     Status: None   Collection Time: 10/22/19  9:58 PM   Specimen: BLOOD RIGHT HAND  Result Value Ref Range Status   Specimen Description BLOOD RIGHT HAND POST VANC/ROCEPHIN  Final   Special Requests   Final    BOTTLES DRAWN AEROBIC AND ANAEROBIC Blood Culture adequate volume   Culture   Final    NO GROWTH 5 DAYS Performed at [facility], [place] [address], [place],  [address]    Report Status 10/27/2019 FINAL  Final  SARS CORONAVIRUS 2 (TAT 6-24 HRS) Nasopharyngeal Nasopharyngeal Swab     Status: None   Collection Time: 10/22/19 10:55 PM   Specimen: Nasopharyngeal Swab  Result Value Ref Range Status   SARS Coronavirus 2 NEGATIVE NEGATIVE Final    Comment: (NOTE) SARS-CoV-2 target nucleic acids are NOT DETECTED. The SARS-CoV-2 RNA is generally detectable in upper and lower respiratory specimens during the acute phase of infection. Negative results do not preclude SARS-CoV-2 infection, do not rule out co-infections with other pathogens, and should not be used as the sole basis for treatment or other patient management decisions. Negative results must be combined with clinical observations, patient history, and epidemiological information. The expected result is Negative. Fact Sheet for Patients: SugarRoll.be Fact Sheet for Healthcare  Providers: https://www.woods-mathews.com/ This test is not yet approved or cleared by the [place] FDA and  has been authorized for detection and/or diagnosis of SARS-CoV-2 by FDA under an Emergency Use Authorization (EUA). This EUA will remain  in effect (meaning this test can be used) for the duration of the COVID-19 declaration under Section 56 4(b)(1) of the Act, 21 U.S.C. section 360bbb-3(b)(1), unless  the authorization is terminated or revoked sooner. Performed at [facility], [place] [address] [place], [place] [address]   MRSA PCR Screening     Status: None   Collection Time: 10/28/19  2:51 PM   Specimen: Nasopharyngeal  Result Value Ref Range Status   MRSA by PCR NEGATIVE NEGATIVE Final    Comment:        The GeneXpert MRSA Assay (FDA approved for NASAL specimens only), is one component of a comprehensive MRSA colonization surveillance program. It is not intended to diagnose MRSA infection nor to guide or monitor treatment for MRSA infections. Performed at [facility], [place] [address], [place], [place] [address]      Labs: BNP (last 3 results) Recent Labs    11/29/18 1854 08/14/19 2016 08/29/19 0852  BNP 792.7* 1,710.9* [id number]*   Basic Metabolic Panel: Recent Labs  Lab 10/26/19 0730 10/27/19 0104 10/28/19 0213 10/29/19 0301 11/14/2019 0201  NA 132* 135 137 138 135  K 4.1 5.0 4.0 4.1 3.6  CL 92* 94* 96* 95* 95*  CO2 25 20* 23 27 22   GLUCOSE 122* 158* 137* 172* 117*  BUN 30* 35* 22 34* 21  CREATININE 4.77* 5.22* 3.41* 4.18* 3.04*  CALCIUM 8.1* 8.4* 8.3* 8.4* 8.5*  MG 1.5*  --   --  2.0 1.9   Liver Function Tests: No results for input(s): AST, ALT, ALKPHOS, BILITOT, PROT, ALBUMIN in the last 168 hours. No results for input(s): LIPASE, AMYLASE in the last 168 hours. No results for input(s): AMMONIA in the last 168 hours. CBC: Recent Labs  Lab 10/26/19 1907 10/27/19 0104 10/28/19 0213 10/29/19 0301 11/06/2019 0201  WBC  10.3 7.6 7.9 12.4* 11.8*  HGB 9.7* 10.1* 9.3* 9.9* 10.7*  HCT 29.9* 31.0* 28.1* 30.2* 33.5*  MCV 92.6 92.5 91.5 92.6 94.6  PLT 161 184 143* 166 175   Cardiac Enzymes: No results for input(s): CKTOTAL, CKMB, CKMBINDEX, TROPONINI in the last 168 hours. BNP: Invalid input(s): POCBNP CBG: Recent Labs  Lab 10/29/19 0820 10/29/19 1152 10/29/19 1953 11/15/2019 0747 10/29/2019 1158  GLUCAP 170* 160* 118* 79 81   D-Dimer No results for input(s): DDIMER in the last 72 hours. Hgb A1c No results for input(s): HGBA1C in the last 72 hours. Lipid Profile No results for input(s): CHOL, HDL, LDLCALC, TRIG, CHOLHDL, LDLDIRECT in the last 72 hours. Thyroid function studies No results for input(s): TSH, T4TOTAL, T3FREE, THYROIDAB in the last 72 hours.  Invalid input(s): FREET3 Anemia work up No results for input(s): VITAMINB12, FOLATE, FERRITIN, TIBC, IRON, RETICCTPCT in the last 72 hours. Urinalysis    Component Value Date/Time   COLORURINE YELLOW 06/17/2017 [place] 06/17/2017 0547   LABSPEC 1.010 06/17/2017 0547   PHURINE 6.0 06/17/2017 0547   GLUCOSEU >=500 (A) 06/17/2017 0547   HGBUR SMALL (A) 06/17/2017 0547   BILIRUBINUR NEGATIVE 06/17/2017 0547   KETONESUR NEGATIVE 06/17/2017 0547   PROTEINUR 100 (A) 06/17/2017 0547   UROBILINOGEN 0.2 09/28/2014 2257   NITRITE NEGATIVE 06/17/2017 0547   LEUKOCYTESUR TRACE (A) 06/17/2017 0547   Sepsis Labs Invalid input(s): PROCALCITONIN,  WBC,  LACTICIDVEN Microbiology Recent Results (from the past 240 hour(s))  Culture, blood (Routine x 2)     Status: None   Collection Time: 10/21/19  4:08 PM   Specimen: BLOOD RIGHT ARM  Result Value Ref Range Status   Specimen Description BLOOD RIGHT ARM UPPER  Final   Special Requests   Final    BOTTLES  DRAWN AEROBIC ONLY Blood Culture adequate volume   Culture   Final    NO GROWTH 5 DAYS Performed at [facility], [place] [address], [place], [place] [address]    Report Status  10/26/2019 FINAL  Final  Blood Culture (routine x 2)     Status: None   Collection Time: 10/22/19  8:36 PM   Specimen: BLOOD RIGHT WRIST  Result Value Ref Range Status   Specimen Description BLOOD RIGHT WRIST  Final   Special Requests   Final    BOTTLES DRAWN AEROBIC AND ANAEROBIC Blood Culture adequate volume   Culture   Final    NO GROWTH 5 DAYS Performed at [facility], [place] [address], [place], [place] [address]    Report Status 10/27/2019 FINAL  Final  Culture, blood (Routine x 2)     Status: None   Collection Time: 10/22/19  9:58 PM   Specimen: BLOOD RIGHT HAND  Result Value Ref Range Status   Specimen Description BLOOD RIGHT HAND POST VANC/ROCEPHIN  Final   Special Requests   Final    BOTTLES DRAWN AEROBIC AND ANAEROBIC Blood Culture adequate volume   Culture   Final    NO GROWTH 5 DAYS Performed at [facility], [place] [address] [place], [place] [address]    Report Status 10/27/2019 FINAL  Final  SARS CORONAVIRUS 2 (TAT 6-24 HRS) Nasopharyngeal Nasopharyngeal Swab     Status: None   Collection Time: 10/22/19 10:55 PM   Specimen: Nasopharyngeal Swab  Result Value Ref Range Status   SARS Coronavirus 2 NEGATIVE NEGATIVE Final    Comment: (NOTE) SARS-CoV-2 target nucleic acids are NOT DETECTED. The SARS-CoV-2 RNA is generally detectable in upper and lower respiratory specimens during the acute phase of infection. Negative results do not preclude SARS-CoV-2 infection, do not rule out co-infections with other pathogens, and should not be used as the sole basis for treatment or other patient management decisions. Negative results must be combined with clinical observations, patient history, and epidemiological information. The expected result is Negative. Fact Sheet for Patients: SugarRoll.be Fact Sheet for Healthcare Providers: https://www.woods-mathews.com/ This test is not yet approved or cleared by the [place]  FDA and  has been authorized for detection and/or diagnosis of SARS-CoV-2 by FDA under an Emergency Use Authorization (EUA). This EUA will remain  in effect (meaning this test can be used) for the duration of the COVID-19 declaration under Section 56 4(b)(1) of the Act, 21 U.S.C. section 360bbb-3(b)(1), unless the authorization is terminated or revoked sooner. Performed at [facility], [place] [address] [place], [place] [address]   MRSA PCR Screening     Status: None   Collection Time: 10/28/19  2:51 PM   Specimen: Nasopharyngeal  Result Value Ref Range Status   MRSA by PCR NEGATIVE NEGATIVE Final    Comment:        The GeneXpert MRSA Assay (FDA approved for NASAL specimens only), is one component of a comprehensive MRSA colonization surveillance program. It is not intended to diagnose MRSA infection nor to guide or monitor treatment for MRSA infections. Performed at [facility], [place] [address], [place], [place] [address]      Time coordinating discharge: Over 32 minutes  SIGNED:   Blain Pais, MD  [facility] 11/11/2019, 12:41 PM   If 7PM-7AM, please contact night-coverage [email or web address] Password [email or web address]

## 2019-10-30 NOTE — Progress Notes (Signed)
   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  Continue daily dressing changes.  ANTICOAGULATION:He is on Eliquis.  DISPOSITION: Possibly home today.  I will arrange follow-up in the office for staple removal in 3 to 4 weeks.   SUBJECTIVE:   Wants to go home.  Wants to sign out AMA.  PHYSICAL EXAM:   Vitals:   10/29/19 2127 11/20/2019 0000 11/16/2019 0400 11/15/2019 0700  BP:  (!) 83/43 (!) 122/52   Pulse:  74 73   Resp:   12   Temp: 98 F (36.7 C) 98.1 F (36.7 C) 98.2 F (36.8 C)   TempSrc:  Oral Oral   SpO2:  94% 90%   Weight:    96 kg  Height:       I inspected his right below the knee amputation and this is healing nicely.  LABS:   Lab Results  Component Value Date   WBC 11.8 (H) 10/29/2019   HGB 10.7 (L) 11/18/2019   HCT 33.5 (L) 11/08/2019   MCV 94.6 11/10/2019   PLT 175 11/14/2019   Lab Results  Component Value Date   CREATININE 3.04 (H) 11/17/2019   Lab Results  Component Value Date   INR 1.5 (H) 10/26/2019   CBG (last 3)  Recent Labs    10/29/19 0820 10/29/19 1152 10/29/19 1953  GLUCAP 170* 160* 118*    PROBLEM LIST:    Principal Problem:   Osteomyelitis of right foot ([place]) Active Problems:   ESRD (end stage renal disease) on dialysis ([place])   Atherosclerosis of native coronary artery of native heart with angina pectoris (HCC)   Chronic combined systolic and diastolic CHF (congestive heart failure) (HCC)   Hyperlipidemia   COPD (chronic obstructive pulmonary disease) (HCC)   Chronic respiratory failure with hypoxia (HCC)   Chronic hypotension   Complete heart block (HCC)   Presence of permanent cardiac pacemaker   PVD (peripheral vascular disease) (HCC)   Unstageable pressure ulcer of sacral region ([place])   CURRENT MEDS:   . apixaban  2.5 mg Oral BID  . atorvastatin  80 mg Oral q1800  . Chlorhexidine Gluconate Cloth  6 each Topical Q0600  . cinacalcet  30 mg Oral Once per day on Mon  Tue Thu Sat  . darbepoetin (ARANESP) injection - DIALYSIS  60 mcg Intravenous Q Tue-HD  . doxycycline  100 mg Oral Q12H  . feeding supplement (NEPRO CARB STEADY)  237 mL Oral BID BM  . feeding supplement (PRO-STAT SUGAR FREE 64)  30 mL Oral BID  . insulin aspart  0-6 Units Subcutaneous TID WC  . insulin detemir  10 Units Subcutaneous QHS  . midodrine  10 mg Oral Once per day on Mon Tue Thu Sat  . midodrine  2.5 mg Oral Once per day on Mon Tue Thu Sat  . midodrine  5 mg Oral 3 times per day on Sun Wed Fri  . mometasone-formoterol  2 puff Inhalation BID  . polyethylene glycol  17 g Oral Daily  . senna  1 tablet Oral Daily  . sevelamer carbonate  3,200 mg Oral TID WC  . sodium chloride flush  3 mL Intravenous Q12H    [name] Office: [phone] 11/26/2019

## 2019-10-30 NOTE — Progress Notes (Signed)
Nsg Discharge Note  Patient discharged home with continued home health. Wife Jeffrey [name] present for discharge instructions. Patient left with own wheelchair and oxygen tank. Cellphone/charger returned to wife.  Admit Date:  10/22/2019 Discharge date: 11/17/2019   Jeffrey [name] to be D/C'd Home per MD order.  AVS completed.  Copy for chart, and copy for patient signed, and dated. Patient/caregiver able to verbalize understanding.  Discharge Medication: Allergies as of 11/02/2019      Reactions   Byetta 10 Mcg Pen [exenatide] Diarrhea, Nausea And Vomiting   Telmisartan Other (See Comments)   Unknown reaction - reported by [facility]   Codeine Itching   Minor reaction   Coumadin [warfarin Sodium] Rash   (wife states coumadin was stopped but rash did not disappear 02/21/19)      Medication List    TAKE these medications   acetaminophen 500 MG tablet Commonly known as: TYLENOL Take 1 tablet (500 mg total) by mouth every 6 (six) hours as needed for mild pain or headache (pain). What changed:   how much to take  reasons to take this   albuterol 108 (90 Base) MCG/ACT inhaler Commonly known as: VENTOLIN HFA Inhale 2 puffs into the lungs every 4 (four) hours as needed for wheezing or shortness of breath.   albuterol (2.5 MG/3ML) 0.083% nebulizer solution Commonly known as: PROVENTIL Take 3 mLs (2.5 mg total) by nebulization every 6 (six) hours as needed for wheezing or shortness of breath.   ALPRAZolam 0.25 MG tablet Commonly known as: XANAX Take 1 tablet (0.25 mg total) by mouth 2 (two) times daily as needed for anxiety. What changed: reasons to take this   amoxicillin-clavulanate 875-125 MG tablet Commonly known as: Augmentin Take 1 tablet by mouth every 12 (twelve) hours for 7 days.   apixaban 2.5 MG Tabs tablet Commonly known as: ELIQUIS Take 1 tablet (2.5 mg total) by mouth 2 (two) times daily.   atorvastatin 80 MG tablet Commonly known as: LIPITOR Take 1 tablet (80 mg  total) by mouth daily at 6 PM.   budesonide-formoterol 160-4.5 MCG/ACT inhaler Commonly known as: SYMBICORT Inhale 2 puffs into the lungs 2 (two) times daily as needed (shortness of breath).   cinacalcet 30 MG tablet Commonly known as: SENSIPAR Take 30 mg by mouth See admin instructions. Take one tablet (30 mg) by mouth on Monday, Tuesday, Thursday, Saturday after dialysis   clopidogrel 75 MG tablet Commonly known as: PLAVIX Take 1 tablet (75 mg total) by mouth daily.   doxercalciferol 4 MCG/2ML injection Commonly known as: HECTOROL Inject 0.5 mLs (1 mcg total) into the vein Every Tuesday,Thursday,and Saturday with dialysis.   doxycycline 100 MG tablet Commonly known as: VIBRA-TABS Take 1 tablet (100 mg total) by mouth every 12 (twelve) hours for 2 days.   feeding supplement (NEPRO CARB STEADY) Liqd Take 237 mLs by mouth 2 (two) times daily between meals.   feeding supplement (PRO-STAT SUGAR FREE 64) Liqd Take 30 mLs by mouth 2 (two) times daily.   Insulin Pen Needle 31G X 6 MM Misc at bedtime.   Levemir FlexTouch 100 UNIT/ML Pen Generic drug: Insulin Detemir INJECT 48 UNITS TOTAL INTO THE SKIN AT BEDTIME. What changed: See the new instructions.   lidocaine-prilocaine cream Commonly known as: EMLA Apply 1 application topically See admin instructions. Apply to port access prior to dialysis on Monday, Tuesday, Thursday, Saturday   loperamide 2 MG capsule Commonly known as: IMODIUM Take 2 mg by mouth 4 (four) times daily as needed  for diarrhea or loose stools.   loratadine 10 MG tablet Commonly known as: CLARITIN Take 10 mg by mouth daily.   metoprolol tartrate 25 MG tablet Commonly known as: LOPRESSOR Take 0.5 tablets (12.5 mg total) by mouth See admin instructions. Take 1/2 tablet (12.5 mg) by mouth on Sunday, Monday, Wednesday, Friday mornings (non-dialysis days), take 1/2 tablet (12.5 mg) every evening What changed: additional instructions   midodrine 10 MG  tablet Commonly known as: PROAMATINE Take 10 mg by mouth See admin instructions. Take one tablet (10 mg) by mouth 30 minutes before dialysis on Monday, Tuesday, Thursday, Saturday What changed: Another medication with the same name was changed. Make sure you understand how and when to take each.   midodrine 2.5 MG tablet Commonly known as: PROAMATINE Take 1 tablet (2.5 mg total) by mouth 3 (three) times daily with meals. Patient should continue to take 10 mg in the morning prior to hemodialysis sessions. What changed:   when to take this  additional instructions   nitroGLYCERIN 0.4 MG SL tablet Commonly known as: Nitrostat Place 1 tablet (0.4 mg total) under the tongue every 5 (five) minutes as needed for chest pain.   OXYGEN Inhale 3 L into the lungs continuous.   pantoprazole 40 MG tablet Commonly known as: PROTONIX Take 1 tablet (40 mg total) by mouth daily.   sevelamer carbonate 800 MG tablet Commonly known as: RENVELA Take 2,400 mg by mouth See admin instructions. Take three tablets (2400 mg) by mouth once or twice daily with meals       Discharge Assessment: Vitals:   11/12/2019 0833 11/23/2019 1201  BP:  100/69  Pulse:  73  Resp:  20  Temp: 97.6 F (36.4 C) 98.4 F (36.9 C)  SpO2:  100%   Skin clean, dry and intact without evidence of skin break down, no evidence of skin tears noted. IV catheter discontinued intact. Site without signs and symptoms of complications - no redness or edema noted at insertion site, patient denies c/o pain - only slight tenderness at site.  Dressing with slight pressure applied.  D/c Instructions-Education: Discharge instructions given to patient/family with verbalized understanding. D/c education completed with patient/family including follow up instructions, medication list, d/c activities limitations if indicated, with other d/c instructions as indicated by MD - patient able to verbalize understanding, all questions fully  answered. Patient instructed to return to ED, call 911, or call MD for any changes in condition.  Patient escorted via [place], and D/C home via private auto.  Erasmo Leventhal, RN 11/25/2019 3:08 PM

## 2019-10-30 NOTE — Progress Notes (Signed)
Occupational Therapy Treatment Patient Details Name: Jeffrey [name] MRN: [id number] DOB: [birth year] Today's Date: 11/23/2019    History of present illness 78 year old male presents on 11/26 for worsening R foot wound with Xray showing osteomyelitis of R foot, pt s/p R BKA. 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   OT comments  Patient semi-supine in bed upon arrival. Patient states he is sitting on a bed pan and its hurting his buttock. Attempt to have patient roll to check however patient is max to total dependence to attempt rolling to his R side. No bed pan in place at this time, patient having difficulty remembering nursing staff already removed the bed pan. Patient require max to near total dependence with repositioning in bed as patient leans to R side and unable to reposition himself. Patient participate in B UE exercises in bed requiring AAROM to reach end range. Patient require min A to complete drinking from cup due to difficulty raising arms up to mouth.    Follow Up Recommendations  SNF;Supervision/Assistance - 24 hour;Other (comment)((patient and family adamant bring patient home with [name]))    Equipment Recommendations  Other (comment)(defer to next venue)       Precautions / Restrictions Precautions Precautions: Fall Precaution Comments: B BKA, watch HR Restrictions Weight Bearing Restrictions: Yes RLE Weight Bearing: Non weight bearing       Mobility Bed Mobility Overal bed mobility: Needs Assistance Bed Mobility: Rolling Rolling: Total assist;Max assist         General bed mobility comments: max A x2 to scoot up in bed, max A for repositioning as patient tends to lean to his R side  Transfers Overall transfer level: Needs assistance               General transfer comment: did not perform transfer today, hoyer  lift total A transfer at baseline     Balance Overall balance assessment: Needs assistance     Sitting balance - Comments: unable at this time patient is very weak, unable to assist with rolling and did not have additional assistance to attempt sitting upright                                    ADL either performed or assessed with clinical judgement   ADL Overall ADL's : Needs assistance/impaired Eating/Feeding: Minimal assistance;Bed level Eating/Feeding Details (indicate cue type and reason): patient struggling to lift UEs to reach cup with straw to his mouth requiring min A with support distally to complete task                                                   Cognition Arousal/Alertness: Awake/alert Behavior During Therapy: WFL for tasks assessed/performed Overall Cognitive Status: History of cognitive impairments - at baseline                                 General Comments: patient adamant about going home however does not appear to have appropriate insight to the extent of his deficits and that his is total care  Exercises Exercises: General Upper Extremity General Exercises - Upper Extremity Shoulder Flexion: AAROM;Both;10 reps;Supine Elbow Flexion: AAROM;Both;10 reps;Supine Elbow Extension: AAROM;10 reps;Both;Supine Digit Composite Flexion: AAROM;Both;10 reps;Supine Composite Extension: AAROM;Both;10 reps;Supine      General Comments significant swelling in R UE     Pertinent Vitals/ Pain       Pain Assessment: Faces Faces Pain Scale: Hurts little more Pain Location: B UEs Pain Descriptors / Indicators: Sore;Tiring Pain Intervention(s): Limited activity within patient's tolerance;Monitored during session;Repositioned         Frequency  Min 2X/week        Progress Toward Goals  OT Goals(current goals can now be found in the care plan section)  Progress towards OT goals: Progressing toward  goals  Acute Rehab OT Goals Patient Stated Goal: "go home" OT Goal Formulation: With patient Time For Goal Achievement: 11/11/19 Potential to Achieve Goals: Fair ADL Goals Pt Will Perform Grooming: with set-up;sitting Pt Will Perform Upper Body Dressing: with min assist;sitting Pt Will Perform Lower Body Dressing: with mod assist;sitting/lateral leans Additional ADL Goal #1: Pt will tolerate sitting EOB for 5 min in preparation for ADL. Additional ADL Goal #2: Pt will progress to EOB with modA in preparation for ADL.  Plan Discharge plan remains appropriate       AM-PAC OT "6 Clicks" Daily Activity     Outcome Measure   Help from another person eating meals?: A Little Help from another person taking care of personal grooming?: A Lot Help from another person toileting, which includes using toliet, bedpan, or urinal?: Total Help from another person bathing (including washing, rinsing, drying)?: A Lot Help from another person to put on and taking off regular upper body clothing?: A Lot Help from another person to put on and taking off regular lower body clothing?: Total 6 Click Score: 11    End of Session Equipment Utilized During Treatment: Oxygen  OT Visit Diagnosis: Other abnormalities of gait and mobility (R26.89);Muscle weakness (generalized) (M62.81)   Activity Tolerance Patient limited by fatigue   Patient Left in bed;with call bell/phone within reach;with bed alarm set   Nurse Communication Mobility status        Time: [id number] OT Time Calculation (min): 23 min  Charges: OT General Charges $OT Visit: 1 Visit OT Treatments $Therapeutic Activity: 8-22 mins $Therapeutic Exercise: 8-22 mins  Jeffrey [name] OT OT office: [place] 11/25/2019, 12:12 PM

## 2019-10-30 NOTE — Progress Notes (Signed)
  Speech Language Pathology Treatment: Dysphagia  Patient Details Name: Jeffrey [name] MRN: [id number] DOB: 30-Jul-1941 Today's Date: 11/19/2019 Time: 2194-7125 SLP Time Calculation (min) (ACUTE ONLY): 10 min  Assessment / Plan / Recommendation Clinical Impression  F/u for further diagnostic treatment.  Swallowing and mechanics of eating appear to be at baseline.  There is adequate mastication despite absence of teeth; brisk swallow response; no s/s of aspiration.  There is adequate synchrony between swallowing and breathing.  CXr today shows stable right lung opacity concerning for pna or atx.  During last two sessions, there have been no overt indications of a dysphagia-related aspiration.  Recommend continuing current diet; there are no further acute SLP needs. Our service will sign off.   HPI HPI: 78 y.o. male admitted 10/22/2019 from home with right foot wound. 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 fib on Eliquis, PVD s/p left BKA (03/2019) with chronic right foot wound, COPD/chronic respiratory failure with hypoxia on 3 L O2 via Jeffrey [name], chronic combined systolic/diastolic CHF (EF 27-12% by TTE 08/15/19), IDT2DM, chronic hypotension requiring midodrine, hyperlipidemia, history of CVA, and anxiety XR = Diffuse airspace disease and volume loss on the right. This couldreflect pneumonia.      SLP Plan  All goals met       Recommendations  Diet recommendations: Regular;Thin liquid Liquids provided via: Cup;Straw Medication Administration: Whole meds with liquid Supervision: Patient able to self feed Postural Changes and/or Swallow Maneuvers: Seated upright 90 degrees                Oral Care Recommendations: Oral care BID SLP Visit Diagnosis: Dysphagia, unspecified (R13.10) Plan: All goals met       GO                Juan Quam Laurice 11/21/2019, 11:28 AM  Estill Bamberg L. Tivis Ringer, [place] Office number [phone] Pager [phone]

## 2019-11-03 NOTE — Progress Notes (Signed)
MURL, NAKAZAWA ([id number]) Visit Report for 10/21/2019 SuperBill Details Patient Name: Date of Service: Jeffrey [name], Jeffrey [name] 10/21/2019 Medical Record [id number] Patient Account Number: [id number] Date of Birth/Sex: Treating RN: [birth year] (78 y.o. Jerilynn Mages) Carlene Coria Primary Care Provider: Kennith Maes Other Clinician: Referring Provider: Treating Provider/Extender:[name], [name], [name]: 15 Diagnosis Coding ICD-10 Codes Code Description I87.2 Venous insufficiency (chronic) (peripheral) I73.9 Peripheral vascular disease, unspecified L97.312 Non-pressure chronic ulcer of right ankle with fat layer exposed L97.812 Non-pressure chronic ulcer of other part of right lower leg with fat layer exposed Z89.512 Acquired absence of left leg below knee T81.31XA Disruption of external operation (surgical) wound, not elsewhere classified, initial encounter E11.622 Type 2 diabetes mellitus with other skin ulcer N18.6 End stage renal disease Z99.2 Dependence on renal dialysis Facility Procedures CPT4 Code Description Modifier Quantity [id number] - WOUND CARE VISIT-LEV 4 EST PT 1 Electronic Signature(s) Signed: 10/21/2019 4:52:33 PM By: Worthy Keeler PA-C Signed: 11/03/2019 2:55:30 PM By: Carlene Coria RN Entered By: Carlene Coria on 10/21/2019 13:56:33

## 2019-11-03 NOTE — Progress Notes (Signed)
SELMA, BRYER ([id number]) Visit Report for 09/23/2019 Arrival Information Details Patient Name: Date of Service: Jeffrey [name], Jeffrey [name] 09/23/2019 12:30 PM Medical Record [id number] Patient Account Number: [id number] Date of Birth/Sex: Treating RN: 1941-07-05 (78 y.o. M) Primary Care Jeffrey [name]: Kennith Maes Other Clinician: Referring Alfio Loescher: Treating Jeffrey [name]/Extender:Jeffrey [name] Weeks in Treatment: 11 Visit Information History Since Last Visit Added or deleted any medications: No Patient Arrived: Wheel Chair Any new allergies or adverse reactions: No Arrival Time: 12:23 Had a fall or experienced change in No Accompanied By: son activities of daily living that may affect Transfer Assistance: None risk of falls: Patient Identification Verified: Yes Signs or symptoms of abuse/neglect since last No Secondary Verification Process Yes visito Completed: Hospitalized since last visit: No Patient Requires Transmission- No Implantable device outside of the clinic excluding No Based Precautions: cellular tissue based products placed in the center Patient Has Alerts: Yes since last visit: Patient Alerts: Patient on Blood Has Dressing in Place as Prescribed: Yes Thinner Pain Present Now: No R ABI = .44, R TBI= .43 Electronic Signature(s) Signed: 11/03/2019 3:02:15 PM By: Sandre Kitty Entered By: Sandre Kitty on 09/23/2019 12:23:49 -------------------------------------------------------------------------------- Clinic Level of Care Assessment Details Patient Name: Date of Service: Jeffrey [name], Jeffrey [name] 09/23/2019 12:30 PM Medical Record [id number] Patient Account Number: [id number] Date of Birth/Sex: Treating RN: 06-05-1941 (78 y.o. [name] Primary Care Jeffrey [name]: Kennith Maes Other Clinician: Referring Jeffrey [name]: Treating Jeffrey [name]/Extender:Jeffrey [name] Weeks in Treatment: 11 Clinic Level of Care Assessment  Items TOOL 4 Quantity Score []  - Use when only an EandM is performed on FOLLOW-UP visit 0 ASSESSMENTS - Nursing Assessment / Reassessment X - Reassessment of Co-morbidities (includes updates in patient status) 1 10 X - Reassessment of Adherence to Treatment Plan 1 5 ASSESSMENTS - Wound and Skin Assessment / Reassessment []  - Simple Wound Assessment / Reassessment - one wound 0 X - Complex Wound Assessment / Reassessment - multiple wounds 5 5 []  - Dermatologic / Skin Assessment (not related to wound area) 0 ASSESSMENTS - Focused Assessment []  - Circumferential Edema Measurements - multi extremities 0 []  - Nutritional Assessment / Counseling / Intervention 0 []  - Lower Extremity Assessment (monofilament, tuning fork, pulses) 0 []  - Peripheral Arterial Disease Assessment (using hand held doppler) 0 ASSESSMENTS - Ostomy and/or Continence Assessment and Care []  - Incontinence Assessment and Management 0 []  - Ostomy Care Assessment and Management (repouching, etc.) 0 PROCESS - Coordination of Care X - Simple Patient / Family Education for ongoing care 1 15 []  - Complex (extensive) Patient / Family Education for ongoing care 0 X - Staff obtains Programmer, systems, Records, Test Results / Process Orders 1 10 []  - Staff telephones HHA, Nursing Homes / Clarify orders / etc 0 []  - Routine Transfer to another Facility (non-emergent condition) 0 []  - Routine Hospital Admission (non-emergent condition) 0 []  - New Admissions / Biomedical engineer / Ordering NPWT, Apligraf, etc. 0 []  - Emergency Hospital Admission (emergent condition) 0 X - Simple Discharge Coordination 1 10 []  - Complex (extensive) Discharge Coordination 0 PROCESS - Special Needs []  - Pediatric / Minor Patient Management 0 []  - Isolation Patient Management 0 []  - Hearing / Language / Visual special needs 0 []  - Assessment of Community assistance (transportation, D/C planning, etc.) 0 []  - Additional assistance / Altered mentation 0 []   - Support Surface(s) Assessment (bed, cushion, seat, etc.) 0 INTERVENTIONS - Wound Cleansing / Measurement []  - Simple Wound Cleansing - one wound 0 X -  Complex Wound Cleansing - multiple wounds 5 5 []  - Wound Imaging (photographs - any number of wounds) 0 []  - Wound Tracing (instead of photographs) 0 []  - Simple Wound Measurement - one wound 0 []  - Complex Wound Measurement - multiple wounds 0 INTERVENTIONS - Wound Dressings X - Small Wound Dressing one or multiple wounds 2 10 X - Medium Wound Dressing one or multiple wounds 1 15 []  - Large Wound Dressing one or multiple wounds 0 X - Application of Medications - topical 1 5 []  - Application of Medications - injection 0 INTERVENTIONS - Miscellaneous []  - External ear exam 0 []  - Specimen Collection (cultures, biopsies, blood, body fluids, etc.) 0 []  - Specimen(s) / Culture(s) sent or taken to Lab for analysis 0 []  - Patient Transfer (multiple staff / Civil Service fast streamer / Similar devices) 0 []  - Simple Staple / Suture removal (25 or less) 0 []  - Complex Staple / Suture removal (26 or more) 0 []  - Hypo / Hyperglycemic Management (close monitor of Blood Glucose) 0 []  - Ankle / Brachial Index (ABI) - do not check if billed separately 0 X - Vital Signs 1 5 Has the patient been seen at the hospital within the last three years: Yes Total Score: 145 Level Of Care: New/Established - Level 4 Electronic Signature(s) Signed: 09/23/2019 1:37:03 PM By: Baruch Gouty RN, BSN Entered By: Baruch Gouty on 09/23/2019 13:32:52 -------------------------------------------------------------------------------- Encounter Discharge Information Details Patient Name: Date of Service: Jeffrey [name] 09/23/2019 12:30 PM Medical Record [id number] Patient Account Number: [id number] Date of Birth/Sex: Treating RN: May 05, 1941 (78 y.o. [name] Primary Care Rossy Virag: Kennith Maes Other Clinician: Referring Jeffrey [name]: Treating  Jeffrey [name]/Extender:Jeffrey [name] Weeks in Treatment: 2 Encounter Discharge Information Items Discharge Condition: Stable Ambulatory Status: Wheelchair Discharge Destination: Home Transportation: Private Auto Accompanied By: son Schedule Follow-up Appointment: Yes Clinical Summary of Care: Patient Declined Electronic Signature(s) Signed: 09/23/2019 1:37:03 PM By: Baruch Gouty RN, BSN Entered By: Baruch Gouty on 09/23/2019 13:35:09 -------------------------------------------------------------------------------- Patient/Caregiver Education Details Patient Name: Jeffrey [name] 10/28/2020andnbsp12:30 Date of Service: PM Medical Record [id number] Number: Patient Account Number: [id number] Treating RN: Date of Birth/Gender: 17-Dec-1940 (78 y.o. [name]) Other Clinician: Primary Care Physician:Holt, Abagail Kitchens Treating Worthy Keeler Referring Physician: Physician/Extender: Clearence Cheek in Treatment: 11 Education Assessment Education Provided To: Patient Education Topics Provided Wound/Skin Impairment: Methods: Explain/Verbal Responses: Reinforcements needed, State content correctly Electronic Signature(s) Signed: 09/23/2019 1:37:03 PM By: Baruch Gouty RN, BSN Entered By: Baruch Gouty on 09/23/2019 13:34:53 -------------------------------------------------------------------------------- Wound Assessment Details Patient Name: Date of Service: Jeffrey [name], Jeffrey [name] 09/23/2019 12:30 PM Medical Record [id number] Patient Account Number: [id number] Date of Birth/Sex: Treating RN: 1941/07/16 (78 y.o. [name] Primary Care Dutchess Crosland: Kennith Maes Other Clinician: Referring Keigan Tafoya: Treating Janecia Palau/Extender:Jeffrey [name] Weeks in Treatment: 11 Wound Status Wound Number: 1 Primary Venous Leg Ulcer Etiology: Wound Location: Right Malleolus - Lateral Wound Open Wounding Event: Gradually  Appeared Status: Date Acquired: 06/26/2019 Comorbid Cataracts, Chronic Obstructive Pulmonary Weeks Of Treatment: 11 History: Disease (COPD), Congestive Heart Failure, Clustered Wound: No Hypertension, Myocardial Infarction, Peripheral Venous Disease, Type II Diabetes, End Stage Renal Disease, Osteomyelitis Wound Measurements Length: (cm) 1 % Reduc Width: (cm) 0.8 % Reduc Depth: (cm) 0.1 Epithel Area: (cm) 0.628 Tunnel Volume: (cm) 0.063 Underm Wound Description Classification: Full Thickness Without Exposed Support Foul O Structures Slough Wound Distinct, outline attached Margin: Exudate Small Amount: Exudate Serosanguineous Type: Exudate red, brown Color: Wound Bed Granulation Amount: Medium (34-66%) Granulation  Quality: Pink Fascia Necrotic Amount: Medium (34-66%) Fat Lay Necrotic Quality: Adherent Slough Tendon Muscle Joint E Bone Ex dor After Cleansing: No /Fibrino Yes Exposed Structure Exposed: No er (Subcutaneous Tissue) Exposed: Yes Exposed: No Exposed: No xposed: No posed: No tion in Area: -77.9% tion in Volume: -80% ialization: None ing: No ining: No Electronic Signature(s) Signed: 09/23/2019 1:37:03 PM By: Baruch Gouty RN, BSN Entered By: Baruch Gouty on 09/23/2019 13:30:33 -------------------------------------------------------------------------------- Wound Assessment Details Patient Name: Date of Service: Jeffrey [name] 09/23/2019 12:30 PM Medical Record [id number] Patient Account Number: [id number] Date of Birth/Sex: Treating RN: 04-Aug-1941 (78 y.o. [name] Primary Care Bladyn Tipps: Kennith Maes Other Clinician: Referring Britni Driscoll: Treating Nara Paternoster/Extender:Jeffrey [name] Weeks in Treatment: 11 Wound Status Wound Number: 10 Primary Diabetic Wound/Ulcer of the Lower Extremity Etiology: Wound Location: Right Toe Third - Plantar Wound Open Wounding Event: Gradually Appeared Status: Date  Acquired: 09/16/2019 Comorbid Cataracts, Chronic Obstructive Pulmonary Weeks Of Treatment: 1 History: Disease (COPD), Congestive Heart Failure, Clustered Wound: No Hypertension, Myocardial Infarction, Peripheral Venous Disease, Type II Diabetes, End Stage Renal Disease, Osteomyelitis Wound Measurements Length: (cm) 0.5 Width: (cm) 1 Depth: (cm) 0.2 Area: (cm) 0.393 Volume: (cm) 0.079 Wound Description Classification: Grade 1 Wound Margin: Flat and Intact Exudate Amount: Medium Exudate Type: Sanguinous Exudate Color: red Wound Bed Granulation Amount: Large (67-100%) Granulation Quality: Red, Friable Necrotic Amount: None Present (0%) After Cleansing: No rino No Exposed Structure osed: No (Subcutaneous Tissue) Exposed: Yes osed: No osed: No sed: No ed: No % Reduction in Area: -150.3% % Reduction in Volume: -154.8% Epithelialization: None Tunneling: No Undermining: No Foul Odor Slough/Fib Fascia Exp Fat Layer Tendon Exp Muscle Exp Joint Expo Bone Expos Electronic Signature(s) Signed: 09/23/2019 1:37:03 PM By: Baruch Gouty RN, BSN Entered By: Baruch Gouty on 09/23/2019 13:30:49 -------------------------------------------------------------------------------- Wound Assessment Details Patient Name: Date of Service: Jeffrey [name], Jeffrey [name] 09/23/2019 12:30 PM Medical Record [id number] Patient Account Number: [id number] Date of Birth/Sex: Treating RN: 05-25-1941 (78 y.o. [name] Primary Care Arcelia Pals: Kennith Maes Other Clinician: Referring Lasharon Dunivan: Treating Nickolas Chalfin/Extender:Jeffrey [name] Weeks in Treatment: 11 Wound Status Wound Number: 4 Primary Venous Leg Ulcer Etiology: Wound Location: Right Lower Leg - Medial Wound Open Wounding Event: Blister Status: Date Acquired: 07/10/2019 Comorbid Cataracts, Chronic Obstructive Pulmonary Weeks Of Treatment: 10 History: Disease (COPD), Congestive Heart Failure, Clustered  Wound: No Hypertension, Myocardial Infarction, Peripheral Venous Disease, Type II Diabetes, End Stage Renal Disease, Osteomyelitis Wound Measurements Length: (cm) 2.2 % Reduc Width: (cm) 2.9 % Reduc Depth: (cm) 0.1 Epithel Area: (cm) 5.011 Tunnel Volume: (cm) 0.501 Underm Wound Description Full Thickness Without Exposed Support Foul O Classification: Structures Slough Wound Distinct, outline attached Margin: Exudate Small Amount: Exudate Serosanguineous Type: Exudate red, brown Color: Wound Bed Granulation Amount: Large (67-100%) Granulation Quality: Red Fascia Necrotic Amount: Small (1-33%) Fat Lay Necrotic Quality: Adherent Slough Tendon Muscle Joint E Bone Ex dor After Cleansing: No /Fibrino Yes Exposed Structure Exposed: No er (Subcutaneous Tissue) Exposed: Yes Exposed: No Exposed: No xposed: No posed: No tion in Area: 21.2% tion in Volume: 21.2% ialization: Small (1-33%) ing: No ining: No Electronic Signature(s) Signed: 09/23/2019 1:37:03 PM By: Baruch Gouty RN, BSN Entered By: Baruch Gouty on 09/23/2019 13:31:12 -------------------------------------------------------------------------------- Wound Assessment Details Patient Name: Date of Service: Jeffrey [name] 09/23/2019 12:30 PM Medical Record [id number] Patient Account Number: [id number] Date of Birth/Sex: Treating RN: 07-31-1941 (78 y.o. 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 Secondary Inflammatory Wounding Event: Gradually Appeared Etiology: Date Acquired: 08/12/2019 Wound Open Weeks Of Treatment: 6 Status: Clustered Wound: Yes Comorbid Cataracts, Chronic Obstructive Pulmonary History: Disease (COPD), Congestive Heart  Failure, Hypertension, Myocardial Infarction, Peripheral Venous Disease, Type II Diabetes, End Stage Renal Disease, Osteomyelitis Wound Measurements Length: (cm) 1 % Reductio Width: (cm) 2.1 % Reductio Depth: (cm) 0.1 Epithelial Clustered Quantity: 2 Tunneling: Area: (cm) 1.649 Undermini Volume: (cm) 0.165 Wound Description Classification: Partial Thickness Wound Margin: Distinct, outline attached Exudate Amount: Small Exudate Type: Serosanguineous Exudate Color: red, brown Wound Bed Granulation Amount: Large (67-100%) Granulation Quality: Red Necrotic Amount: None Present (0%) Foul Odor After Cleansing: No Slough/Fibrino No Exposed Structure Fascia Exposed: No Fat Layer (Subcutaneous Tissue) Exposed: Yes Tendon Exposed: No Muscle Exposed: No Joint Exposed: No Bone Exposed: No n in Area: 82.5% n in Volume: 82.5% ization: Medium (34-66%) No ng: No Electronic Signature(s) Signed: 09/23/2019 1:37:03 PM By: Baruch Gouty RN, BSN Entered By: Baruch Gouty on 09/23/2019 13:31:27 -------------------------------------------------------------------------------- Wound Assessment Details Patient Name: Date of Service: Jeffrey [name] 09/23/2019 12:30 PM Medical Record [id number] Patient Account Number: [id number] Date of Birth/Sex: Treating RN: 08-12-1941 (78 y.o. [name] Primary Care Jamichael Knotts: Kennith Maes Other Clinician: Referring Kaydon Creedon: Treating Nyima Vanacker/Extender:Jeffrey [name] Weeks in Treatment: 11 Wound Status Wound Number: 7 Primary Diabetic Wound/Ulcer of the Lower Extremity Etiology: Wound Location: Right Toe Second Wound Open Wounding Event: Gradually Appeared Status: Date Acquired: 08/14/2019 Comorbid Cataracts, Chronic Obstructive Pulmonary Weeks Of Treatment: 4 History: Disease (COPD), Congestive Heart Failure, Clustered Wound: No Hypertension, Myocardial Infarction, Peripheral Venous Disease, Type II  Diabetes, End Stage Renal Disease, Osteomyelitis Wound Measurements Length: (cm) 1 % Reductio Width: (cm) 1.2 % Reductio Depth: (cm) 0.1 Epithelial Area: (cm) 0.942 Tunneling Volume: (cm) 0.094 Undermini Wound Description Classification: Grade 2 Wound Margin: Distinct, outline attached Exudate Amount: Small Exudate Type: Serosanguineous Exudate Color: red, brown Wound Bed Granulation Amount: Small (1-33%) Granulation Quality: Pink Necrotic Amount: Large (67-100%) Necrotic Quality: Eschar, Adherent Slough Foul Odor After Cleansing: No Slough/Fibrino Yes Exposed Structure Fascia Exposed: No Fat Layer (Subcutaneous Tissue) Exposed: Yes Tendon Exposed: No Muscle Exposed: No Joint Exposed: No Bone Exposed: No n in Area: -33.2% n in Volume: 33.3% ization: None : No ng: No Electronic Signature(s) Signed: 09/23/2019 1:37:03 PM By: Baruch Gouty RN, BSN Entered By: Baruch Gouty on 09/23/2019 13:31:45 -------------------------------------------------------------------------------- Vitals Details Patient Name: Date of Service: Jeffrey [name] 09/23/2019 12:30 PM Medical Record [id number] Patient Account Number: [id number] Date of Birth/Sex: Treating RN: 05-Feb-1941 (78 y.o. M) Primary Care Farron Lafond: Kennith Maes Other Clinician: Referring Verita Kuroda: Treating Andersson Larrabee/Extender:Jeffrey [name] Weeks in Treatment: 11 Vital Signs Time Taken: 12:23 Temperature (F): 98.3 Height (in): 71 Pulse (bpm): 76 Weight (lbs): 209 Respiratory Rate (breaths/min): 18 Body Mass Index (BMI): 29.1 Blood Pressure (mmHg): 105/70 Reference Range: 80 - 120 mg / dl Electronic Signature(s) Signed: 11/03/2019 3:02:15 PM By: Sandre Kitty Entered By: Sandre Kitty on 09/23/2019 12:24:03

## 2019-11-03 NOTE — Progress Notes (Signed)
ELIAKIM, PUZON ([id number]) Visit Report for 10/21/2019 Arrival Information Details Patient Name: Date of Service: Jeffrey [name], Jeffrey [name] 10/21/2019 1:00 PM Medical Record [id number] Patient Account Number: [id number] Date of Birth/Sex: Treating RN: 04/01/1941 (78 y.o. Jeffrey [name]) Carlene Coria Primary Care Emiley Digiacomo: Kennith Maes Other Clinician: Referring Tenea Sens: Treating Jacinda Kanady/Extender:[name], [name], [name]: 15 Visit Information History Since Last Visit All ordered tests and consults were completed: No Patient Arrived: Wheel Chair Added or deleted any medications: No Arrival Time: 13:30 Any new allergies or adverse reactions: No Accompanied By: son Had a fall or experienced change in No Transfer Assistance: Civil Service fast streamer activities of daily living that may affect Patient Identification Verified: Yes risk of falls: Secondary Verification Process Yes Signs or symptoms of abuse/neglect since last No Completed: visito Patient Requires Transmission- No Hospitalized since last visit: No Based Precautions: Implantable device outside of the clinic excluding No Patient Has Alerts: Yes cellular tissue based products placed in the center Patient Alerts: Patient on Blood since last visit: Thinner Has Dressing in Place as Prescribed: Yes R ABI = .44, R Has Compression in Place as Prescribed: Yes TBI= .43 Pain Present Now: Yes Electronic Signature(s) Signed: 11/03/2019 2:55:30 PM By: Carlene Coria RN Entered By: Carlene Coria on 10/21/2019 13:45:33 -------------------------------------------------------------------------------- Clinic Level of Care Assessment Details Patient Name: Date of Service: Jeffrey [name], Jeffrey [name] 10/21/2019 1:00 PM Medical Record [id number] Patient Account Number: [id number] Date of Birth/Sex: Treating RN: [birth year] (78 y.o. Jeffrey [name]) Carlene Coria Primary Care Dock Baccam: Kennith Maes Other Clinician: Referring Anihya Tuma: Treating  Zara Wendt/Extender:[name], [name], [name]: 15 Clinic Level of Care Assessment Items TOOL 4 Quantity Score X - Use when only an EandM is performed on FOLLOW-UP visit 1 0 ASSESSMENTS - Nursing Assessment / Reassessment X - Reassessment of Co-morbidities (includes updates in patient status) 1 10 X - Reassessment of Adherence to Treatment Plan 1 5 ASSESSMENTS - Wound and Skin Assessment / Reassessment []  - Simple Wound Assessment / Reassessment - one wound 0 X - Complex Wound Assessment / Reassessment - multiple wounds 5 5 []  - Dermatologic / Skin Assessment (not related to wound area) 0 ASSESSMENTS - Focused Assessment []  - Circumferential Edema Measurements - multi extremities 0 []  - Nutritional Assessment / Counseling / Intervention 0 []  - Lower Extremity Assessment (monofilament, tuning fork, pulses) 0 []  - Peripheral Arterial Disease Assessment (using hand held doppler) 0 ASSESSMENTS - Ostomy and/or Continence Assessment and Care []  - Incontinence Assessment and Management 0 []  - Ostomy Care Assessment and Management (repouching, etc.) 0 PROCESS - Coordination of Care X - Simple Patient / Family Education for ongoing care 1 15 []  - Complex (extensive) Patient / Family Education for ongoing care 0 []  - Staff obtains Programmer, systems, Records, Test Results / Process Orders 0 []  - Staff telephones HHA, Nursing Homes / Clarify orders / etc 0 []  - Routine Transfer to another Facility (non-emergent condition) 0 []  - Routine Hospital Admission (non-emergent condition) 0 []  - New Admissions / Biomedical engineer / Ordering NPWT, Apligraf, etc. 0 X - Emergency Hospital Admission (emergent condition) 1 20 []  - Simple Discharge Coordination 0 X - Complex (extensive) Discharge Coordination 1 15 PROCESS - Special Needs []  - Pediatric / Minor Patient Management 0 []  - Isolation Patient Management 0 []  - Hearing / Language / Visual special needs 0 []  - Assessment of  Community assistance (transportation, D/C planning, etc.) 0 []  - Additional assistance / Altered mentation 0 []  - Support Surface(s) Assessment (bed, cushion,  seat, etc.) 0 INTERVENTIONS - Wound Cleansing / Measurement []  - Simple Wound Cleansing - one wound 0 X - Complex Wound Cleansing - multiple wounds 5 5 []  - Wound Imaging (photographs - any number of wounds) 0 []  - Wound Tracing (instead of photographs) 0 []  - Simple Wound Measurement - one wound 0 []  - Complex Wound Measurement - multiple wounds 0 INTERVENTIONS - Wound Dressings []  - Small Wound Dressing one or multiple wounds 0 []  - Medium Wound Dressing one or multiple wounds 0 []  - Large Wound Dressing one or multiple wounds 0 []  - Application of Medications - topical 0 []  - Application of Medications - injection 0 INTERVENTIONS - Miscellaneous []  - External ear exam 0 []  - Specimen Collection (cultures, biopsies, blood, body fluids, etc.) 0 []  - Specimen(s) / Culture(s) sent or taken to Lab for analysis 0 []  - Patient Transfer (multiple staff / Civil Service fast streamer / Similar devices) 0 []  - Simple Staple / Suture removal (25 or less) 0 []  - Complex Staple / Suture removal (26 or more) 0 []  - Hypo / Hyperglycemic Management (close monitor of Blood Glucose) 0 []  - Ankle / Brachial Index (ABI) - do not check if billed separately 0 X - Vital Signs 1 5 Has the patient been seen at the hospital within the last three years: Yes Total Score: 120 Level Of Care: New/Established - Level 4 Electronic Signature(s) Signed: 11/03/2019 2:55:30 PM By: Carlene Coria RN Entered By: Carlene Coria on 10/21/2019 13:55:22 -------------------------------------------------------------------------------- Encounter Discharge Information Details Patient Name: Date of Service: Jeffrey [name] 10/21/2019 1:00 PM Medical Record [id number] Patient Account Number: [id number] Date of Birth/Sex: Treating RN: January 07, 1941 (78 y.o. Jeffrey [name] Primary Care  Lawrence Roldan: Kennith Maes Other Clinician: Referring Bathsheba Durrett: Treating Swetha Rayle/Extender:[name], [name], [name]: 15 Encounter Discharge Information Items Discharge Condition: Unstable Ambulatory Status: Wheelchair Discharge Destination: Emergency Room Transportation: Private Auto Accompanied By: son Schedule Follow-up Appointment: Yes Clinical Summary of Care: Patient Declined Electronic Signature(s) Signed: 11/03/2019 2:55:30 PM By: Carlene Coria RN Entered By: Carlene Coria on 10/21/2019 13:56:24 -------------------------------------------------------------------------------- Patient/Caregiver Education Details Patient Name: Jeffrey [name] 11/25/2020andnbsp1:00 Date of Service: PM Medical Record [id number] Number: Patient Account Number: [id number] Treating RN: Date of Birth/Gender: [birth year] (78 y.o. Jeffrey [name]) Other Clinician: Primary Care Physician: Kennith Maes Treating Worthy Keeler Referring Physician: Physician/Extender: Clearence Cheek in Treatment: 15 Education Assessment Education Provided To: Patient Education Topics Provided Pain: Methods: Explain/Verbal Responses: State content correctly Electronic Signature(s) Signed: 11/03/2019 2:55:30 PM By: Carlene Coria RN Entered By: Carlene Coria on 10/21/2019 13:51:40 -------------------------------------------------------------------------------- [place] Details Patient Name: Date of Service: Jeffrey [name] 10/21/2019 1:00 PM Medical Record [id number] Patient Account Number: [id number] Date of Birth/Sex: Treating RN: 13-Jan-1941 (78 y.o. Jeffrey [name]) Carlene Coria Primary Care Rakim Moone: Kennith Maes Other Clinician: Referring Melesio Madara: Treating Dejia Ebron/Extender:[name], [name], [name]: 15 Vital Signs Time Taken: 13:31 Temperature (F): 98.6 Height (in): 71 Pulse (bpm): 55 Weight (lbs): 209 Respiratory Rate (breaths/min): 18 Body Mass  Index (BMI): 29.1 Blood Pressure (mmHg): 97/51 Reference Range: 80 - 120 mg / dl Notes patient complaints of pain in lower leg times 1 week, rates at 10/10 , removed dressing noted lower leg warm, red, swollen, pain, noted 2nd and 3rd toe white/grey on anterior side, purple/black on posterior side, swollen, foul odor. lower leg washed with soap and water, applied plain gauze dressing and secured with tape. instructed patient/son/wife on need to go to the emergency room now,  for evaluation. Son to take to emergency room at this time . Electronic Signature(s) Signed: 11/03/2019 2:55:30 PM By: Carlene Coria RN Entered By: Carlene Coria on 10/21/2019 13:51:15

## 2019-11-03 NOTE — Progress Notes (Signed)
IWAO, SHAMBLIN ([id number]) Visit Report for 09/02/2019 Arrival Information Details Patient Name: Date of Service: Jeffrey [name], Jeffrey [name] 09/02/2019 11:00 AM Medical Record [id number] Patient Account Number: [id number] Date of Birth/Sex: Treating RN: 15-Dec-1940 (78 y.o. Jerilynn Mages) Carlene Coria Primary Care Saki Legore: Kennith Maes Other Clinician: Referring Phyllis Whitefield: Treating Yarden Hillis/Extender:[name], [name], [name]: 8 Visit Information History Since Last Visit All ordered tests and consults were completed: No Patient Arrived: Wheel Chair Added or deleted any medications: No Arrival Time: 11:51 Any new allergies or adverse reactions: No Accompanied By: son Had a fall or experienced change in No Transfer Assistance: None activities of daily living that may affect Patient Identification Verified: Yes risk of falls: Secondary Verification Process Yes Signs or symptoms of abuse/neglect since last No Completed: visito Patient Requires Transmission- No Hospitalized since last visit: No Based Precautions: Implantable device outside of the clinic excluding No Patient Has Alerts: Yes cellular tissue based products placed in the center Patient Alerts: Patient on Blood since last visit: Thinner Has Dressing in Place as Prescribed: Yes R ABI = .44, R Pain Present Now: No TBI= .43 Electronic Signature(s) Signed: 11/03/2019 2:59:38 PM By: Carlene Coria RN Entered By: Carlene Coria on 09/02/2019 11:51:47 -------------------------------------------------------------------------------- Clinic Level of Care Assessment Details Patient Name: Date of Service: Jeffrey [name], Jeffrey [name] 09/02/2019 11:00 AM Medical Record [id number] Patient Account Number: [id number] Date of Birth/Sex: Treating RN: 20-Sep-1941 (78 y.o. Jeffrey [name] Primary Care Stark Aguinaga: Kennith Maes Other Clinician: Referring Jaydah Stahle: Treating Rheana Casebolt/Extender:[name], [name],  [name]: 8 Clinic Level of Care Assessment Items TOOL 4 Quantity Score '[]'  - Use when only an EandM is performed on FOLLOW-UP visit 0 ASSESSMENTS - Nursing Assessment / Reassessment X - Reassessment of Co-morbidities (includes updates in patient status) 1 10 X - Reassessment of Adherence to Treatment Plan 1 5 ASSESSMENTS - Wound and Skin Assessment / Reassessment '[]'  - Simple Wound Assessment / Reassessment - one wound 0 X - Complex Wound Assessment / Reassessment - multiple wounds 4 5 '[]'  - Dermatologic / Skin Assessment (not related to wound area) 0 ASSESSMENTS - Focused Assessment '[]'  - Circumferential Edema Measurements - multi extremities 0 '[]'  - Nutritional Assessment / Counseling / Intervention 0 X - Lower Extremity Assessment (monofilament, tuning fork, pulses) 1 5 '[]'  - Peripheral Arterial Disease Assessment (using hand held doppler) 0 ASSESSMENTS - Ostomy and/or Continence Assessment and Care '[]'  - Incontinence Assessment and Management 0 '[]'  - Ostomy Care Assessment and Management (repouching, etc.) 0 PROCESS - Coordination of Care X - Simple Patient / Family Education for ongoing care 1 15 '[]'  - Complex (extensive) Patient / Family Education for ongoing care 0 X - Staff obtains Programmer, systems, Records, Test Results / Process Orders 1 10 '[]'  - Staff telephones HHA, Nursing Homes / Clarify orders / etc 0 '[]'  - Routine Transfer to another Facility (non-emergent condition) 0 '[]'  - Routine Hospital Admission (non-emergent condition) 0 '[]'  - New Admissions / Biomedical engineer / Ordering NPWT, Apligraf, etc. 0 '[]'  - Emergency Hospital Admission (emergent condition) 0 X - Simple Discharge Coordination 1 10 '[]'  - Complex (extensive) Discharge Coordination 0 PROCESS - Special Needs '[]'  - Pediatric / Minor Patient Management 0 '[]'  - Isolation Patient Management 0 '[]'  - Hearing / Language / Visual special needs 0 '[]'  - Assessment of Community assistance (transportation, D/C  planning, etc.) 0 '[]'  - Additional assistance / Altered mentation 0 '[]'  - Support Surface(s) Assessment (bed, cushion, seat, etc.) 0 INTERVENTIONS - Wound Cleansing /  Measurement '[]'  - Simple Wound Cleansing - one wound 0 X - Complex Wound Cleansing - multiple wounds 4 5 X - Wound Imaging (photographs - any number of wounds) 1 5 '[]'  - Wound Tracing (instead of photographs) 0 '[]'  - Simple Wound Measurement - one wound 0 X - Complex Wound Measurement - multiple wounds 4 5 INTERVENTIONS - Wound Dressings X - Small Wound Dressing one or multiple wounds 2 10 X - Medium Wound Dressing one or multiple wounds 1 15 '[]'  - Large Wound Dressing one or multiple wounds 0 X - Application of Medications - topical 1 5 '[]'  - Application of Medications - injection 0 INTERVENTIONS - Miscellaneous '[]'  - External ear exam 0 '[]'  - Specimen Collection (cultures, biopsies, blood, body fluids, etc.) 0 '[]'  - Specimen(s) / Culture(s) sent or taken to Lab for analysis 0 '[]'  - Patient Transfer (multiple staff / Civil Service fast streamer / Similar devices) 0 '[]'  - Simple Staple / Suture removal (25 or less) 0 '[]'  - Complex Staple / Suture removal (26 or more) 0 '[]'  - Hypo / Hyperglycemic Management (close monitor of Blood Glucose) 0 '[]'  - Ankle / Brachial Index (ABI) - do not check if billed separately 0 X - Vital Signs 1 5 Has the patient been seen at the hospital within the last three years: Yes Total Score: 165 Level Of Care: New/Established - Level 5 Electronic Signature(s) Signed: 09/03/2019 12:09:12 PM By: Baruch Gouty RN, BSN Entered By: Baruch Gouty on 09/02/2019 12:42:22 -------------------------------------------------------------------------------- Encounter Discharge Information Details Patient Name: Date of Service: Jeffrey [name] 09/02/2019 11:00 AM Medical Record [id number] Patient Account Number: [id number] Date of Birth/Sex: Treating RN: February 08, 1941 (78 y.o. [name] Primary Care Alexsa Flaum: Kennith Maes Other Clinician: Referring Josep Luviano: Treating Ericia Moxley/Extender:[name], [name], [name]: 8 Encounter Discharge Information Items Discharge Condition: Stable Ambulatory Status: Wheelchair Discharge Destination: Home Transportation: Private Auto Accompanied By: family member Schedule Follow-up Appointment: Yes Clinical Summary of Care: Electronic Signature(s) Signed: 09/02/2019 6:07:42 PM By: Deon Pilling Entered By: Deon Pilling on 09/02/2019 12:57:45 -------------------------------------------------------------------------------- Lower Extremity Assessment Details Patient Name: Date of Service: Jeffrey [name], Jeffrey [name] 09/02/2019 11:00 AM Medical Record [id number] Patient Account Number: [id number] Date of Birth/Sex: Treating RN: March 06, 1941 (78 y.o. Oval Linsey Primary Care Hortensia Duffin: Kennith Maes Other Clinician: Referring Osman Calzadilla: Treating Leanne Sisler/Extender:[name], [name], [name]: 8 Edema Assessment Assessed: [Left: No] [Right: No] Edema: [Left: Ye] [Right: s] Calf Left: Right: Point of Measurement: 27 cm From Medial Instep cm 40 cm Ankle Left: Right: Point of Measurement: 10 cm From Medial Instep cm 24 cm Electronic Signature(s) Signed: 11/03/2019 2:59:38 PM By: Carlene Coria RN Entered By: Carlene Coria on 09/02/2019 12:26:51 -------------------------------------------------------------------------------- [place] Details Patient Name: Date of Service: Jeffrey [name] 09/02/2019 11:00 AM Medical Record [id number] Patient Account Number: [id number] Date of Birth/Sex: Treating RN: [birth year] (78 y.o. Jeffrey [name] Primary Care Edeline Greening: Kennith Maes Other Clinician: Referring Senan Urey: Treating Lyndsie Wallman/Extender:[name], [name], [name]: 8 Active Inactive Wound/Skin Impairment Nursing Diagnoses: Impaired tissue integrity Knowledge deficit  related to ulceration/compromised skin integrity Goals: Patient/caregiver will verbalize understanding of skin care regimen Date Initiated: 07/08/2019 Target Resolution Date: 09/30/2019 Goal Status: Active Ulcer/skin breakdown will have a volume reduction of 30% by week 4 Date Initiated: 07/08/2019 Date Inactivated: 08/05/2019 Target Resolution Date: 08/05/2019 Goal Status: Met Ulcer/skin breakdown will have a volume reduction of 50% by week 8 Date Initiated: 08/05/2019 Date Inactivated: 09/02/2019 Target Resolution Date: 09/04/2019 Unmet  Reason: multiple Goal Status: Unmet comorbidities Ulcer/skin breakdown will have a volume reduction of 80% by week 12 Date Initiated: 09/02/2019 Target Resolution Date: 09/30/2019 Goal Status: Active Interventions: Assess patient/caregiver ability to obtain necessary supplies Assess patient/caregiver ability to perform ulcer/skin care regimen upon admission and as needed Assess ulceration(s) every visit Treatment Activities: Skin care regimen initiated : 07/08/2019 Topical wound management initiated : 07/08/2019 Notes: Electronic Signature(s) Signed: 09/03/2019 12:09:12 PM By: Baruch Gouty RN, BSN Entered By: Baruch Gouty on 09/02/2019 12:31:00 -------------------------------------------------------------------------------- Pain Assessment Details Patient Name: Date of Service: Jeffrey [name] 09/02/2019 11:00 AM Medical Record [id number] Patient Account Number: [id number] Date of Birth/Sex: Treating RN: [birth year] (78 y.o. Oval Linsey Primary Care Blessin Kanno: Kennith Maes Other Clinician: Referring Martie Muhlbauer: Treating Charels Stambaugh/Extender:[name], [name], [name]: 8 Active Problems Location of Pain Severity and Description of Pain Patient Has Paino No Site Locations Pain Management and Medication Current Pain Management: Electronic Signature(s) Signed: 11/03/2019 2:59:38 PM By: Carlene Coria RN Entered By: Carlene Coria on 09/02/2019 11:52:40 -------------------------------------------------------------------------------- Patient/Caregiver Education Details Patient Name: Jeffrey [name] 10/7/2020andnbsp11:00 Date of Service: AM Medical Record [id number] Number: Patient Account Number: [id number] Treating RN: Date of Birth/Gender: 1941-08-19 (78 y.o. Jeffrey [name]) Other Clinician: Primary Care Physician: Kennith Maes Treating Worthy Keeler Referring Physician: Physician/Extender: Clearence Cheek in Treatment: 8 Education Assessment Education Provided To: Patient Education Topics Provided Tissue Oxygenation: Methods: Explain/Verbal Responses: Reinforcements needed, State content correctly Venous: Methods: Explain/Verbal Responses: Reinforcements needed, State content correctly Wound/Skin Impairment: Methods: Explain/Verbal Responses: Reinforcements needed, State content correctly Electronic Signature(s) Signed: 09/03/2019 12:09:12 PM By: Baruch Gouty RN, BSN Entered By: Baruch Gouty on 09/02/2019 12:31:34 -------------------------------------------------------------------------------- Wound Assessment Details Patient Name: Date of Service: Jeffrey [name] 09/02/2019 11:00 AM Medical Record [id number] Patient Account Number: [id number] Date of Birth/Sex: Treating RN: [birth year] (78 y.o. Jerilynn Mages) Carlene Coria Primary Care Ciaira Natividad: Kennith Maes Other Clinician: Referring Kenzley Ke: Treating Tajay Muzzy/Extender:[name], [name], [name]: 8 Wound Status Wound Number: 1 Primary Venous Leg Ulcer Etiology: Wound Location: Right Malleolus - Lateral Wound Open Wounding Event: Gradually Appeared Status: Date Acquired: 06/26/2019 Comorbid Cataracts, Chronic Obstructive Pulmonary Weeks Of Treatment: 8 History: Disease (COPD), Congestive Heart Failure, Clustered Wound: No Hypertension, Myocardial Infarction, Peripheral Venous Disease, Type  II Diabetes, End Stage Renal Disease, Osteomyelitis Photos Wound Measurements Length: (cm) 1 % Reduct Width: (cm) 1.3 % Reduct Depth: (cm) 0.2 Epitheli Area: (cm) 1.021 Tunneli Volume: (cm) 0.204 Undermi Wound Description Full Thickness Without Exposed Support Foul Od Classification: Structures Slough/ Wound Distinct, outline attached Margin: Exudate Small Amount: Exudate Serosanguineous Type: Exudate red, brown Color: Wound Bed Granulation Amount: Medium (34-66%) Granulation Quality: Pink Fascia Necrotic Amount: Medium (34-66%) Fat Lay Necrotic Quality: Adherent Slough Tendon Muscle Joint E Bone Ex or After Cleansing: No Fibrino Yes Exposed Structure Exposed: No er (Subcutaneous Tissue) Exposed: Yes Exposed: No Exposed: No xposed: No posed: No ion in Area: -189.2% ion in Volume: -482.9% alization: None ng: No ning: No Electronic Signature(s) Signed: 09/03/2019 1:38:24 PM By: Mikeal Hawthorne EMT/HBOT Signed: 11/03/2019 2:59:38 PM By: Carlene Coria RN Entered By: Mikeal Hawthorne on 09/03/2019 11:37:26 -------------------------------------------------------------------------------- Wound Assessment Details Patient Name: Date of Service: Jeffrey [name] 09/02/2019 11:00 AM Medical Record [id number] Patient Account Number: [id number] Date of Birth/Sex: Treating RN: 1941-03-13 (78 y.o. Oval Linsey Primary Care Primus Gritton: Kennith Maes Other Clinician: Referring Rosibel Giacobbe: Treating Geran Haithcock/Extender:[name], [name], [name]: 8 Wound Status Wound Number: 4 Primary Venous Leg Ulcer  Etiology: Wound Location: Right Lower Leg - Medial Wound Open Wounding Event: Blister Status: Date Acquired: 07/10/2019 Comorbid Cataracts, Chronic Obstructive Pulmonary Weeks Of Treatment: 7 History: Disease (COPD), Congestive Heart Failure, Clustered Wound: No Hypertension, Myocardial Infarction, Peripheral Venous Disease, Type II  Diabetes, End Stage Renal Disease, Osteomyelitis Photos Wound Measurements Length: (cm) 3.5 % Reduct Width: (cm) 3.3 % Reduct Depth: (cm) 0.1 Epitheli Area: (cm) 9.071 Tunneli Volume: (cm) 0.907 Undermi Wound Description Classification: Full Thickness Without Exposed Support Foul Od Structures Slough/ Wound Distinct, outline attached Margin: Exudate Medium Amount: Exudate Serosanguineous Type: Exudate red, brown Color: Wound Bed Granulation Amount: Large (67-100%) Granulation Quality: Red Fascia Necrotic Amount: Small (1-33%) Fat Lay Necrotic Quality: Adherent Slough Tendon Muscle Joint E Bone Ex or After Cleansing: No Fibrino Yes Exposed Structure Exposed: No er (Subcutaneous Tissue) Exposed: Yes Exposed: No Exposed: No xposed: No posed: No ion in Area: -42.6% ion in Volume: -42.6% alization: None ng: No ning: No Electronic Signature(s) Signed: 09/03/2019 1:38:24 PM By: Mikeal Hawthorne EMT/HBOT Signed: 11/03/2019 2:59:38 PM By: Carlene Coria RN Entered By: Mikeal Hawthorne on 09/03/2019 11:37:04 -------------------------------------------------------------------------------- Wound Assessment Details Patient Name: Date of Service: Jeffrey [name] 09/02/2019 11:00 AM Medical Record [id number] Patient Account Number: [id number] Date of Birth/Sex: Treating RN: July 11, 1941 (78 y.o. 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 Secondary Inflammatory Wounding Event: Gradually Appeared Etiology: Date Acquired: 08/12/2019 Wound Open Weeks Of Treatment: 3 Status: Clustered Wound: Yes Comorbid Cataracts, Chronic Obstructive Pulmonary History: Disease (COPD), Congestive Heart Failure, Hypertension, Myocardial  Infarction, Peripheral Venous Disease, Type II Diabetes, End Stage Renal Disease, Osteomyelitis Wound Measurements Length: (cm) 3 % Reducti Width: (cm) 8 % Reducti Depth: (cm) 0.1 Epithelia Clustered Quantity: 2 Tunneling Area: (cm) 18.85 Undermin Volume: (cm) 1.885 Wound Description Classification: Partial Thickness Wound Margin: Distinct, outline attached Exudate Amount: Small Exudate Type: Serosanguineous Exudate Color: red, brown Wound Bed Granulation Amount: Large (67-100%) Necrotic Amount: None Present (0%) Foul Odor After Cleansing: No Slough/Fibrino No Exposed Structure Fascia Exposed: No Fat Layer (Subcutaneous Tissue) Exposed: Yes Tendon Exposed: No Muscle Exposed: No Joint Exposed: No Bone Exposed: No on in Area: -100% on in Volume: -100.1% lization: None : No ing: No Electronic Signature(s) Signed: 11/03/2019 2:59:38 PM By: Carlene Coria RN Entered By: Carlene Coria on 09/02/2019 12:06:10 -------------------------------------------------------------------------------- Wound Assessment Details Patient Name: Date of Service: Jeffrey [name], Jeffrey [name] 09/02/2019 11:00 AM Medical Record [id number] Patient Account Number: [id number] Date of Birth/Sex: Treating RN: 1941/10/13 (78 y.o. Jerilynn Mages) Carlene Coria Primary Care Milayna Rotenberg: Kennith Maes Other Clinician: Referring Oza Oberle: Treating Janee Ureste/Extender:[name], [name], [name]: 8 Wound Status Wound Number: 6 Primary Venous Leg Ulcer Etiology: Wound Location: Right, Lateral Lower Leg Wound Open Wounding Event: Gradually Appeared Status: Date Acquired: 08/12/2019 Comorbid Cataracts, Chronic Obstructive Pulmonary Weeks Of Treatment: 3 History: Disease (COPD), Congestive Heart Failure, Clustered Wound: No Hypertension, Myocardial Infarction, Peripheral Venous Disease, Type II Diabetes, End Stage Renal Disease, Osteomyelitis Wound Measurements Length: (cm) 15 % Reduct Width: (cm) 4  % Reduct Depth: (cm) 0.1 Epitheli Area: (cm) 47.124 Tunneli Volume: (cm) 4.712 Undermi Wound Description Classification: Full Thickness Without Exposed Support Foul Od Structures Slough/ Wound Distinct, outline attached Margin: Exudate Small Amount: Exudate Serosanguineous Type: Exudate red, brown Color: Wound Bed Granulation Amount: Large (67-100%) Granulation Quality: Red Fascia Necrotic Amount: None Present (0%)  Fat Lay Tendon Muscle Joint E Bone Ex or After Cleansing: No Fibrino No Exposed Structure Exposed: No er (Subcutaneous Tissue) Exposed: Yes Exposed: No Exposed: No xposed: No posed: No ion in Area: -2899.6% ion in Volume: -2901.3% alization: Large (67-100%) ng: No ning: No Electronic Signature(s) Signed: 11/03/2019 2:59:38 PM By: Carlene Coria RN Entered By: Carlene Coria on 09/02/2019 12:27:57 -------------------------------------------------------------------------------- Wound Assessment Details Patient Name: Date of Service: Jeffrey [name], Jeffrey [name] 09/02/2019 11:00 AM Medical Record [id number] Patient Account Number: [id number] Date of Birth/Sex: Treating RN: [birth year] (78 y.o. Jerilynn Mages) Carlene Coria Primary Care Nathanel Tallman: Kennith Maes Other Clinician: Referring Kaylyne Axton: Treating Navayah Sok/Extender:[name], [name], [name]: 8 Wound Status Wound Number: 7 Primary Diabetic Wound/Ulcer of the Lower Extremity Etiology: Wound Location: Right Toe Second Wound Open Wounding Event: Gradually Appeared Status: Date Acquired: 08/14/2019 Comorbid Cataracts, Chronic Obstructive Pulmonary Weeks Of Treatment: 1 History: Disease (COPD), Congestive Heart Failure, Clustered Wound: No Hypertension, Myocardial Infarction, Peripheral Venous Disease, Type II Diabetes, End Stage Renal Disease, Osteomyelitis Wound Measurements Length: (cm) 1 Width: (cm) 1 Depth: (cm) 0.1 Area: (cm) 0.785 Volume: (cm) 0.079 Wound  Description Classification: Grade 2 Wound Margin: Distinct, outline attached Exudate Amount: Medium Exudate Type: Serosanguineous Exudate Color: red, brown Wound Bed Granulation Amount: Medium (34-66%) Granulation Quality: Red, Pink Necrotic Amount: Medium (34-66%) Necrotic Quality: Eschar, Adherent Slough After Cleansing: No brino Yes Exposed Structure osed: No (Subcutaneous Tissue) Exposed: No osed: No osed: No sed: No ed: No % Reduction in Area: -11% % Reduction in Volume: 44% Epithelialization: None Tunneling: No Undermining: No Foul Odor Slough/Fi Fascia Exp Fat Layer Tendon Exp Muscle Exp Joint Expo Bone Expos Electronic Signature(s) Signed: 11/03/2019 2:59:38 PM By: Carlene Coria RN Entered By: Carlene Coria on 09/02/2019 12:06:26 -------------------------------------------------------------------------------- Vitals Details Patient Name: Date of Service: Jeffrey [name] 09/02/2019 11:00 AM Medical Record [id number] Patient Account Number: [id number] Date of Birth/Sex: Treating RN: 1941/08/04 (78 y.o. Jerilynn Mages) Carlene Coria Primary Care Lukah Goswami: Kennith Maes Other Clinician: Referring Avonna Iribe: Treating Deajah Erkkila/Extender:[name], [name], [name]: 8 Vital Signs Time Taken: 11:51 Temperature (F): 97.8 Height (in): 71 Pulse (bpm): 56 Weight (lbs): 209 Respiratory Rate (breaths/min): 18 Body Mass Index (BMI): 29.1 Blood Pressure (mmHg): 70/50 Reference Range: 80 - 120 mg / dl Electronic Signature(s) Signed: 11/03/2019 2:59:38 PM By: Carlene Coria RN Entered By: Carlene Coria on 09/02/2019 11:52:18

## 2019-11-03 NOTE — Progress Notes (Signed)
Jeffrey [name], Jeffrey [name] ([id number]) Visit Report for 09/16/2019 Arrival Information Details Patient Name: Date of Service: Jeffrey [name], Jeffrey [name] 09/16/2019 2:00 PM Medical Record [id number] Patient Account Number: [id number] Date of Birth/Sex: Treating RN: Jul 07, 1941 (78 y.o. Jeffrey [name] Primary Care Jeffrey [name]: Jeffrey [name] Other Clinician: Referring Jeffrey [name]: Treating Jeffrey [name]/Extender:Jeffrey [name], Jeffrey [name], Jeffrey [name] in Treatment: 10 Visit Information History Since Last Visit Added or deleted any medications: No Patient Arrived: Wheel Chair Any new allergies or adverse reactions: No Arrival Time: 15:14 Had a fall or experienced change in No Accompanied By: daughter activities of daily living that may affect Transfer Assistance: None risk of falls: Patient Identification Verified: Yes Signs or symptoms of abuse/neglect since last No Secondary Verification Process Yes visito Completed: Hospitalized since last visit: No Patient Requires Transmission- No Implantable device outside of the clinic excluding No Based Precautions: cellular tissue based products placed in the center Patient Has Alerts: Yes since last visit: Patient Alerts: Patient on Blood Has Dressing in Place as Prescribed: Yes Thinner Pain Present Now: Yes R ABI = .44, R TBI= .43 Electronic Signature(s) Signed: 11/03/2019 3:02:15 PM By: Sandre Kitty Entered By: Sandre Kitty on 09/16/2019 15:15:06 -------------------------------------------------------------------------------- Clinic Level of Care Assessment Details Patient Name: Date of Service: Jeffrey [name], Jeffrey [name] 09/16/2019 2:00 PM Medical Record [id number] Patient Account Number: [id number] Date of Birth/Sex: Treating RN: 03-31-1941 (78 y.o. Jeffrey [name] Primary Care Jeffrey [name]: Jeffrey [name] Other Clinician: Referring Jeffrey [name]: Treating [name]/Extender:Jeffrey [name], Jeffrey [name], Jeffrey [name] in Treatment: 10 Clinic Level of  Care Assessment Items TOOL 4 Quantity Score X - Use when only an EandM is performed on FOLLOW-UP visit 1 0 ASSESSMENTS - Nursing Assessment / Reassessment X - Reassessment of Co-morbidities (includes updates in patient status) 1 10 X - Reassessment of Adherence to Treatment Plan 1 5 ASSESSMENTS - Wound and Skin Assessment / Reassessment '[]'  - Simple Wound Assessment / Reassessment - one wound 0 X - Complex Wound Assessment / Reassessment - multiple wounds 7 5 '[]'  - Dermatologic / Skin Assessment (not related to wound area) 0 ASSESSMENTS - Focused Assessment '[]'  - Circumferential Edema Measurements - multi extremities 0 '[]'  - Nutritional Assessment / Counseling / Intervention 0 X - Lower Extremity Assessment (monofilament, tuning fork, pulses) 1 5 '[]'  - Peripheral Arterial Disease Assessment (using hand held doppler) 0 ASSESSMENTS - Ostomy and/or Continence Assessment and Care '[]'  - Incontinence Assessment and Management 0 '[]'  - Ostomy Care Assessment and Management (repouching, etc.) 0 PROCESS - Coordination of Care X - Simple Patient / Family Education for ongoing care 1 15 '[]'  - Complex (extensive) Patient / Family Education for ongoing care 0 X - Staff obtains Programmer, systems, Records, Test Results / Process Orders 1 10 '[]'  - Staff telephones HHA, Nursing Homes / Clarify orders / etc 0 '[]'  - Routine Transfer to another Facility (non-emergent condition) 0 '[]'  - Routine Hospital Admission (non-emergent condition) 0 '[]'  - New Admissions / Biomedical engineer / Ordering NPWT, Apligraf, etc. 0 '[]'  - Emergency Hospital Admission (emergent condition) 0 X - Simple Discharge Coordination 1 10 '[]'  - Complex (extensive) Discharge Coordination 0 PROCESS - Special Needs '[]'  - Pediatric / Minor Patient Management 0 '[]'  - Isolation Patient Management 0 '[]'  - Hearing / Language / Visual special needs 0 '[]'  - Assessment of Community assistance (transportation, D/C planning, etc.) 0 '[]'  - Additional assistance /  Altered mentation 0 '[]'  - Support Surface(s) Assessment (bed, cushion, seat, etc.) 0 INTERVENTIONS - Wound Cleansing / Measurement '[]'  - Simple Wound Cleansing - one  wound 0 X - Complex Wound Cleansing - multiple wounds 7 5 X - Wound Imaging (photographs - any number of wounds) 1 5 '[]'  - Wound Tracing (instead of photographs) 0 '[]'  - Simple Wound Measurement - one wound 0 X - Complex Wound Measurement - multiple wounds 7 5 INTERVENTIONS - Wound Dressings X - Small Wound Dressing one or multiple wounds 3 10 '[]'  - Medium Wound Dressing one or multiple wounds 0 X - Large Wound Dressing one or multiple wounds 1 20 X - Application of Medications - topical 1 5 '[]'  - Application of Medications - injection 0 INTERVENTIONS - Miscellaneous '[]'  - External ear exam 0 '[]'  - Specimen Collection (cultures, biopsies, blood, body fluids, etc.) 0 '[]'  - Specimen(s) / Culture(s) sent or taken to Lab for analysis 0 X - Patient Transfer (multiple staff / Civil Service fast streamer / Similar devices) 1 10 '[]'  - Simple Staple / Suture removal (25 or less) 0 '[]'  - Complex Staple / Suture removal (26 or more) 0 '[]'  - Hypo / Hyperglycemic Management (close monitor of Blood Glucose) 0 '[]'  - Ankle / Brachial Index (ABI) - do not check if billed separately 0 X - Vital Signs 1 5 Has the patient been seen at the hospital within the last three years: Yes Total Score: 235 Level Of Care: New/Established - Level 5 Electronic Signature(s) Signed: 09/16/2019 6:50:57 PM By: Jeffrey [name] Entered By: Jeffrey Hurst on 09/16/2019 17:36:05 -------------------------------------------------------------------------------- Encounter Discharge Information Details Patient Name: Date of Service: Jeffrey [name] 09/16/2019 2:00 PM Medical Record [id number] Patient Account Number: [id number] Date of Birth/Sex: Treating RN: February 23, 1941 (78 y.o. Jeffrey [name] Primary Care Jeffrey [name]: Jeffrey [name] Other Clinician: Referring Jeffrey [name]:  Treating Jeffrey [name]/Extender:Jeffrey [name], Jeffrey [name], Jeffrey [name] in Treatment: 10 Encounter Discharge Information Items Discharge Condition: Stable Ambulatory Status: Wheelchair Discharge Destination: Home Transportation: Private Auto Accompanied By: daughter Schedule Follow-up Appointment: Yes Clinical Summary of Care: Electronic Signature(s) Signed: 09/16/2019 6:26:11 PM By: Deon Pilling Entered By: Deon Pilling on 09/16/2019 17:25:20 -------------------------------------------------------------------------------- Lower Extremity Assessment Details Patient Name: Date of Service: Jeffrey [name], Jeffrey [name] 09/16/2019 2:00 PM Medical Record [id number] Patient Account Number: [id number] Date of Birth/Sex: Treating RN: 12/02/1940 (78 y.o. Jerilynn Mages) Carlene Coria Primary Care Tressia Labrum: Jeffrey [name] Other Clinician: Referring Fischer Halley: Treating Carrin Vannostrand/Extender:Jeffrey [name], Jeffrey [name], Jeffrey [name] in Treatment: 10 Edema Assessment Assessed: [Left: No] [Right: No] Edema: [Left: Ye] [Right: s] Calf Left: Right: Point of Measurement: 27 cm From Medial Instep cm 41 cm Ankle Left: Right: Point of Measurement: 10 cm From Medial Instep cm 23 cm Electronic Signature(s) Signed: 11/03/2019 3:01:15 PM By: Carlene Coria RN Entered By: Carlene Coria on 09/16/2019 15:19:22 -------------------------------------------------------------------------------- Multi-Disciplinary Care Plan Details Patient Name: Date of Service: Jeffrey [name] 09/16/2019 2:00 PM Medical Record [id number] Patient Account Number: [id number] Date of Birth/Sex: Treating RN: 1941-09-01 (78 y.o. Jeffrey [name] Primary Care Saleah Rishel: Jeffrey [name] Other Clinician: Referring Moria Brophy: Treating Shyler Hamill/Extender:Jeffrey [name], Jeffrey [name], Jeffrey [name] in Treatment: 10 Active Inactive Wound/Skin Impairment Nursing Diagnoses: Impaired tissue integrity Knowledge deficit related to ulceration/compromised skin  integrity Goals: Patient/caregiver will verbalize understanding of skin care regimen Date Initiated: 07/08/2019 Target Resolution Date: 09/30/2019 Goal Status: Active Ulcer/skin breakdown will have a volume reduction of 30% by week 4 Date Initiated: 07/08/2019 Date Inactivated: 08/05/2019 Target Resolution Date: 08/05/2019 Goal Status: Met Ulcer/skin breakdown will have a volume reduction of 50% by week 8 Date Initiated: 08/05/2019 Date Inactivated: 09/02/2019 Target Resolution Date: 09/04/2019 Unmet Reason: multiple Goal Status: Unmet comorbidities Ulcer/skin breakdown  will have a volume reduction of 80% by week 12 Date Initiated: 09/02/2019 Target Resolution Date: 09/30/2019 Goal Status: Active Interventions: Assess patient/caregiver ability to obtain necessary supplies Assess patient/caregiver ability to perform ulcer/skin care regimen upon admission and as needed Assess ulceration(s) every visit Treatment Activities: Skin care regimen initiated : 07/08/2019 Topical wound management initiated : 07/08/2019 Notes: Electronic Signature(s) Signed: 09/16/2019 6:50:57 PM By: Jeffrey [name] Entered By: Jeffrey Hurst on 09/16/2019 17:34:50 -------------------------------------------------------------------------------- Pain Assessment Details Patient Name: Date of Service: Jeffrey [name] 09/16/2019 2:00 PM Medical Record [id number] Patient Account Number: [id number] Date of Birth/Sex: Treating RN: [birth year] (78 y.o. Jeffrey [name] Primary Care Eudelia Hiltunen: Jeffrey [name] Other Clinician: Referring Ugochukwu Chichester: Treating Darril Patriarca/Extender:Jeffrey [name], Jeffrey [name], Jeffrey [name] in Treatment: 10 Active Problems Location of Pain Severity and Description of Pain Patient Has Paino Yes Site Locations Rate the pain. Current Pain Level: 9 Pain Management and Medication Current Pain Management: Electronic Signature(s) Signed: 09/16/2019 6:50:57 PM By: Jeffrey [name] Signed:  11/03/2019 3:02:15 PM By: Sandre Kitty Entered By: Sandre Kitty on 09/16/2019 15:15:57 -------------------------------------------------------------------------------- Patient/Caregiver Education Details Patient Name: Jeffrey [name] 10/21/2020andnbsp2:00 Date of Service: PM Medical Record [id number] Number: Patient Account Number: [id number] Treating RN: Date of Birth/Gender: 1941-01-28 (78 y.o. Jeffrey [name]) Other Clinician: Primary Care Physician: Jeffrey [name] Treating Worthy Keeler Referring Physician: Physician/Extender: Clearence Cheek in Treatment: 10 Education Assessment Education Provided To: Patient Education Topics Provided Pressure: Methods: Explain/Verbal Responses: State content correctly Wound/Skin Impairment: Methods: Explain/Verbal Responses: State content correctly Electronic Signature(s) Signed: 09/16/2019 6:50:57 PM By: Jeffrey [name] Entered By: Jeffrey Hurst on 09/16/2019 17:35:08 -------------------------------------------------------------------------------- Wound Assessment Details Patient Name: Date of Service: Jeffrey [name] 09/16/2019 2:00 PM Medical Record [id number] Patient Account Number: [id number] Date of Birth/Sex: Treating RN: Nov 23, 1941 (78 y.o. Jeffrey [name] Primary Care Saloma Cadena: Jeffrey [name] Other Clinician: Referring Sorayah Schrodt: Treating Thresia Ramanathan/Extender:Jeffrey [name], Jeffrey [name], Jeffrey [name] in Treatment: 10 Wound Status Wound Number: 1 Primary Venous Leg Ulcer Etiology: Wound Location: Right Malleolus - Lateral Wound Open Wounding Event: Gradually Appeared Status: Date Acquired: 06/26/2019 Comorbid Cataracts, Chronic Obstructive Pulmonary [name] Of Treatment: 10 History: Disease (COPD), Congestive Heart Failure, Clustered Wound: No Hypertension, Myocardial Infarction, Peripheral Venous Disease, Type II Diabetes, End Stage Renal Disease, Osteomyelitis Photos Wound  Measurements Length: (cm) 1 % Reducti Width: (cm) 0.8 % Reduct Depth: (cm) 0.1 Epitheli Area: (cm) 0.628 Tunneli Volume: (cm) 0.063 Undermi Wound Description Classification: Full Thickness Without Exposed Support Foul Od Structures Slough/ Wound Distinct, outline attached Margin: Exudate Small Amount: Exudate Serosanguineous Type: Exudate red, brown Color: Wound Bed Granulation Amount: Medium (34-66%) Granulation Quality: Pink Fascia Necrotic Amount: Medium (34-66%) Fat Lay Necrotic Quality: Adherent Slough Tendon Muscle Joint E Bone Ex or After Cleansing: No Fibrino Yes Exposed Structure Exposed: No er (Subcutaneous Tissue) Exposed: Yes Exposed: No Exposed: No xposed: No posed: No on in Area: -77.9% ion in Volume: -80% alization: None ng: No ning: No Electronic Signature(s) Signed: 09/17/2019 4:25:07 PM By: Mikeal Hawthorne EMT/HBOT Signed: 09/18/2019 5:59:58 PM By: Jeffrey [name] Previous Signature: 09/16/2019 6:50:57 PM Version By: Jeffrey [name] Entered By: Mikeal Hawthorne on 09/17/2019 13:59:00 -------------------------------------------------------------------------------- Wound Assessment Details Patient Name: Date of Service: Jeffrey [name] 09/16/2019 2:00 PM Medical Record [id number] Patient Account Number: [id number] Date of Birth/Sex: Treating RN: 24-May-1941 (78 y.o. Jeffrey [name] Primary Care Milbern Doescher: Jeffrey [name] Other Clinician: Referring Tavone Caesar: Treating Trinidad Petron/Extender:Jeffrey [name], Jeffrey [name], Jeffrey [name] in Treatment: 10 Wound Status Wound Number:  10 Primary Diabetic Wound/Ulcer of the Lower Extremity Etiology: Wound Location: Right Toe Third - Plantar Wound Open Wounding Event: Gradually Appeared Status: Date Acquired: 09/16/2019 Comorbid Cataracts, Chronic Obstructive Pulmonary [name] Of Treatment: 0 History: Disease (COPD), Congestive Heart Failure, Clustered Wound: No Hypertension, Myocardial  Infarction, Peripheral Venous Disease, Type II Diabetes, End Stage Renal Disease, Osteomyelitis Photos Wound Measurements Length: (cm) 0.2 % Reducti Width: (cm) 1 % Reducti Depth: (cm) 0.2 Epithelia Area: (cm) 0.157 Tunnelin Volume: (cm) 0.031 Undermin Wound Description Classification: Grade 1 Wound Margin: Flat and Intact Exudate Amount: Medium Exudate Type: Sanguinous Exudate Color: red Wound Bed Granulation Amount: Large (67-100%) Granulation Quality: Red Necrotic Amount: None Present (0%) Foul Odor After Cleansing: No Slough/Fibrino No Exposed Structure Fascia Exposed: No Fat Layer (Subcutaneous Tissue) Exposed: Yes Tendon Exposed: No Muscle Exposed: No Joint Exposed: No Bone Exposed: No on in Area: 0% on in Volume: 0% lization: None g: No ing: No Electronic Signature(s) Signed: 09/17/2019 4:25:07 PM By: Mikeal Hawthorne EMT/HBOT Signed: 09/18/2019 5:59:58 PM By: Jeffrey [name] Previous Signature: 09/16/2019 6:50:57 PM Version By: Jeffrey [name] Entered By: Mikeal Hawthorne on 09/17/2019 14:03:46 -------------------------------------------------------------------------------- Wound Assessment Details Patient Name: Date of Service: Jeffrey [name] 09/16/2019 2:00 PM Medical Record [id number] Patient Account Number: [id number] Date of Birth/Sex: Treating RN: [birth year] (78 y.o. Jeffrey [name] Primary Care Ardis Lawley: Jeffrey [name] Other Clinician: Referring Giovanni Biby: Treating Laiylah Roettger/Extender:Jeffrey [name], Jeffrey [name], Jeffrey [name] in Treatment: 10 Wound Status Wound Number: 4 Primary Venous Leg Ulcer Etiology: Wound Location: Right Lower Leg - Medial Wound Open Wounding Event: Blister Wounding Event: Blister Status: Date Acquired: 07/10/2019 Comorbid Cataracts, Chronic Obstructive Pulmonary [name] Of Treatment: 9 History: Disease (COPD), Congestive Heart Failure, Clustered Wound: No Hypertension, Myocardial Infarction, Peripheral  Venous Disease, Type II Diabetes, End Stage Renal Disease, Osteomyelitis Photos Wound Measurements Length: (cm) 2.2 % Reduct Width: (cm) 2.9 % Reduct Depth: (cm) 0.1 Epitheli Area: (cm) 5.011 Tunneli Volume: (cm) 0.501 Undermi Wound Description Classification: Full Thickness Without Exposed Support Foul Od Structures Slough/ Wound Distinct, outline attached Margin: Exudate Medium Amount: Exudate Serosanguineous Type: Exudate red, brown Color: Wound Bed Granulation Amount: Large (67-100%) Granulation Quality: Red Fascia E Necrotic Amount: Small (1-33%) Fat Laye Necrotic Quality: Adherent Slough Tendon E Muscle E Joint Ex Bone Exp or After Cleansing: No Fibrino Yes Exposed Structure xposed: No r (Subcutaneous Tissue) Exposed: Yes xposed: No xposed: No posed: No osed: No ion in Area: 21.2% ion in Volume: 21.2% alization: None ng: No ning: No Electronic Signature(s) Signed: 09/17/2019 4:25:07 PM By: Mikeal Hawthorne EMT/HBOT Signed: 09/18/2019 5:59:58 PM By: Jeffrey [name] Previous Signature: 09/16/2019 6:50:57 PM Version By: Jeffrey [name] Entered By: Mikeal Hawthorne on 09/17/2019 14:01:31 -------------------------------------------------------------------------------- Wound Assessment Details Patient Name: Date of Service: Jeffrey [name] 09/16/2019 2:00 PM Medical Record [id number] Patient Account Number: [id number] Date of Birth/Sex: Treating RN: 10/17/1941 (78 y.o. 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 Secondary Inflammatory Wounding Event: Gradually Appeared Etiology: Date Acquired: 08/12/2019 Wound Open [name] Of Treatment: 5 Status: Clustered Wound: Yes Comorbid Cataracts, Chronic  Obstructive Pulmonary History: Disease (COPD), Congestive Heart Failure, Hypertension, Myocardial Infarction, Peripheral Venous Disease, Type II Diabetes, End Stage Renal Disease, Osteomyelitis Photos Wound Measurements Length: (cm) 1 % Reduction i Width: (cm) 2.1 % Reduction i Depth: (cm) 0.1 Epithelializa  Clustered Quantity: 2 Tunneling: Area: (cm) 1.649 Undermining: Volume: (cm) 0.165 Wound Description Classification: Partial Thickness Foul Odor Af Wound Margin: Distinct, outline attached Slough/Fibri Exudate Amount: Small Exudate Type: Serosanguineous Exudate Color: red, brown Wound Bed Granulation Amount: Large (67-100%) Granulation Quality: Red Fascia Expos Necrotic Amount: None Present (0%) Fat Layer (S Tendon Expos Muscle Expos Joint Expose Bone Exposed Electronic Signature(s) Signed: 09/17/2019 4:25:07 PM By: Mikeal Hawthorne EMT/HBOT Signed: 09/18/2019 5:59:58 PM By: Jeffrey [name] Previous Signature: 09/16/2019 6:50:57 PM Version By: Tedra Coupe Entered By: Mikeal Hawthorne on 10/22 ter Cleansing: No no No Exposed Structure ed: No ubcutaneous Tissue) Exposed: Yes ed: No ed: No d: No : No ara RN, [name] /2020 13:58:38 n Area: 82.5% n Volume: 82.5% tion: Medium (34-66%) No No -------------------------------------------------------------------------------- Wound Assessment Details Patient Name: Date of Service: Jeffrey [name], Jeffrey [name] 09/16/2019 2:00 PM Medical Record [id number] Patient Account Number: [id number] Date of Birth/Sex: Treating RN: [birth year] (78 y.o. Jeffrey [name] Primary Care Evanna Washinton: Jeffrey [name] Other Clinician: Referring Nakeesha Bowler: Treating Kamirah Shugrue/Extender:Jeffrey [name], Jeffrey [name], Jeffrey [name] in Treatment: 10 Wound Status Wound Number: 6 Primary Venous Leg Ulcer Etiology: Wound Location: Right Lower Leg - Lateral Wound Healed - Epithelialized Wounding Event: Gradually Appeared Status: Date Acquired:  08/12/2019 Comorbid Cataracts, Chronic Obstructive Pulmonary [name] Of Treatment: 5 History: Disease (COPD), Congestive Heart Failure, Clustered Wound: No Hypertension, Myocardial Infarction, Peripheral Venous Disease, Type II Diabetes, End Stage Renal Disease, Osteomyelitis Photos Wound Measurements Length: (cm) 0 % Reduct Width: (cm) 0 % Reduct Depth: (cm) 0 Epitheli Area: (cm) 0 Tunneli Volume: (cm) 0 Undermi Wound Description Full Thickness Without Exposed Support Foul Od Classification: Structures Slough/ Exudate None Present Amount: Wound Bed Granulation Amount: None Present (0%) Necrotic Amount: None Present (0%) Fascia Fat Layer ( Tendon Expo Muscle Expo Joint Expos Bone Expose or After Cleansing: No Fibrino No Exposed Structure Exposed: No Subcutaneous Tissue) Exposed: No sed: No sed: No ed: No d: No ion in Area: 100% ion in Volume: 100% alization: Large (67-100%) ng: No ning: No Electronic Signature(s) Signed: 09/17/2019 4:25:07 PM By: Mikeal Hawthorne EMT/HBOT Signed: 09/18/2019 5:59:58 PM By: Jeffrey [name] Previous Signature: 09/16/2019 6:50:57 PM Version By: Jeffrey [name] Entered By: Mikeal Hawthorne on 09/17/2019 14:01:58 -------------------------------------------------------------------------------- Wound Assessment Details Patient Name: Date of Service: Jeffrey [name] 09/16/2019 2:00 PM Medical Record [id number] Patient Account Number: [id number] Date of Birth/Sex: Treating RN: May 30, 1941 (78 y.o. Jeffrey [name] Primary Care Garren Greenman: Jeffrey [name] Other Clinician: Referring Aniko Finnigan: Treating Kaz Auld/Extender:Jeffrey [name], Jeffrey [name], Jeffrey [name] in Treatment: 10 Wound Status Wound Number: 7 Primary Diabetic Wound/Ulcer of the Lower Extremity Etiology: Wound Location: Right Toe Second Wound Open Wounding Event: Gradually Appeared Status: Date Acquired: 08/14/2019 Comorbid Cataracts, Chronic Obstructive  Pulmonary [name] Of Treatment: 3 History: Disease (COPD), Congestive Heart Failure, Clustered Wound: No Hypertension, Myocardial Infarction, Peripheral Venous Disease, Type II Diabetes, End Stage Renal Disease, Osteomyelitis Photos Wound Measurements Length: (cm) 1 Width: (cm) 1.2 Depth: (cm) 0.1 Area: (cm) 0.942 Volume: (cm) 0.094 Wound Description Classification: Grade 2 Wound Margin: Distinct, outline attached Exudate Amount: Small Exudate Type: Serosanguineous Exudate Color: red, brown Wound Bed Granulation Amount: None Present (0%) Necrotic Amount: Large (67-100%) Necrotic Quality: Eschar, Adherent Slough After Cleansing: No rino Yes Exposed Structure sed: No Subcutaneous Tissue) Exposed: No sed: No sed: No ed: No d: No % Reduction in Area: -33.2% % Reduction in Volume: 33.3% Epithelialization: None Foul Odor Slough/Fib Fascia Expo Fat Layer ( Tendon Expo Muscle Expo Joint Expos  Bone Expose Electronic Signature(s) Signed: 09/17/2019 4:25:07 PM By: Mikeal Hawthorne EMT/HBOT Signed: 09/18/2019 5:59:58 PM By: Jeffrey [name] Previous Signature: 09/16/2019 6:50:57 PM Version By: Jeffrey [name] Entered By: Mikeal Hawthorne on 09/17/2019 14:02:20 -------------------------------------------------------------------------------- Wound Assessment Details Patient Name: Date of Service: Jeffrey [name], Jeffrey [name] 09/16/2019 2:00 PM Medical Record [id number] Patient Account Number: [id number] Date of Birth/Sex: Treating RN: 1941-07-09 (78 y.o. Jeffrey [name] Primary Care Farhaan Mabee: Jeffrey [name] Other Clinician: Referring Sadako Cegielski: Treating Benyamin Jeff/Extender:Jeffrey [name], Jeffrey [name], Jeffrey [name] in Treatment: 10 Wound Status Wound Number: 8 Primary Pressure Ulcer Etiology: Wound Location: Left Ischium Wound Open Wounding Event: Gradually Appeared Status: Date Acquired: 09/16/2019 Comorbid Cataracts, Chronic Obstructive Pulmonary [name] Of  Treatment: 0 History: Disease (COPD), Congestive Heart Failure, Clustered Wound: No Hypertension, Myocardial Infarction, Peripheral Venous Disease, Type II Diabetes, End Stage Renal Disease, Osteomyelitis Photos Wound Measurements Length: (cm) 1.5 Width: (cm) 1.5 Depth: (cm) 0.1 Area: (cm) 1.767 Volume: (cm) 0.177 Wound Description Classification: Category/Stage II Exudate Amount: Medium Exudate Type: Serosanguineous Exudate Color: red, brown Wound Bed Granulation Amount: Large (67-100%) Granulation Quality: Red Necrotic Amount: Small (1-33%) Necrotic Quality: Adherent Slough After Cleansing: No rino No Exposed Structure sed: No Subcutaneous Tissue) Exposed: Yes sed: No sed: No ed: No d: No % Reduction in Area: 0% % Reduction in Volume: 0% Epithelialization: None Tunneling: No Undermining: No Foul Odor Slough/Fib Fascia Expo Fat Layer ( Tendon Expo Muscle Expo Joint Expos Bone Expose Electronic Signature(s) Signed: 09/17/2019 4:25:07 PM By: Mikeal Hawthorne EMT/HBOT Signed: 09/18/2019 5:59:58 PM By: Jeffrey [name] Previous Signature: 09/16/2019 6:50:57 PM Version By: Jeffrey [name] Entered By: Mikeal [place] on 09/17/2019 14:02:42 -------------------------------------------------------------------------------- Wound Assessment Details Patient Name: Date of Service: Jeffrey [name] 09/16/2019 2:00 PM Medical Record [id number] Patient Account Number: [id number] Date of Birth/Sex: Treating RN: 1941/07/27 (78 y.o. Jeffrey [name] Primary Care Tashanna Dolin: Jeffrey [name] Other Clinician: Referring Rosabel Sermeno: Treating Anaria Kroner/Extender:Jeffrey [name], Jeffrey [name], Jeffrey [name] in Treatment: 10 Wound Status Wound Number: 9 Primary Pressure Ulcer Etiology: Wound Location: Perineum Wound Open Wounding Event: Gradually Appeared Status: Date Acquired: 09/16/2019 Comorbid Cataracts, Chronic Obstructive Pulmonary [name] Of Treatment:  0 History: Disease (COPD), Congestive Heart Failure, Clustered Wound: No Hypertension, Myocardial Infarction, Peripheral Venous Disease, Type II Diabetes, End Stage Renal Disease, Osteomyelitis Photos Wound Measurements Length: (cm) 4.5 Width: (cm) 1 Depth: (cm) 0.3 Area: (cm) 3.534 Volume: (cm) 1.06 Wound Description Classification: Category/Stage II Exudate Amount: Medium Exudate Type: Serosanguineous Exudate Color: red, brown Wound Bed Granulation Amount: Medium (34-66%) Granulation Quality: Red Necrotic Amount: Medium (34-66%) Necrotic Quality: Adherent Slough After Cleansing: No rino Yes Exposed Structure sed: No Subcutaneous Tissue) Exposed: Yes sed: No sed: No ed: No d: No % Reduction in Area: 0% % Reduction in Volume: 0% Epithelialization: None Tunneling: No Undermining: No Foul Odor Slough/Fib Fascia Expo Fat Layer ( Tendon Expo Muscle Expo Joint Expos Bone Expose Electronic Signature(s) Signed: 09/17/2019 4:25:07 PM By: Mikeal Hawthorne EMT/HBOT Signed: 09/18/2019 5:59:58 PM By: Jeffrey [name] Previous Signature: 09/16/2019 6:50:57 PM Version By: Jeffrey [name] Entered By: Mikeal Hawthorne on 09/17/2019 14:03:04 -------------------------------------------------------------------------------- [place] Details Patient Name: Date of Service: Jeffrey [name] 09/16/2019 2:00 PM Medical Record [id number] Patient Account Number: [id number] Date of Birth/Sex: Treating RN: 20-May-1941 (78 y.o. Jeffrey [name] Primary Care Yul Diana: Jeffrey [name] Other Clinician: Referring Aika Brzoska: Treating Deray Dawes/Extender:Jeffrey [name], Jeffrey [name], Jeffrey [name] in Treatment: 10 Vital Signs Time Taken: 15:15 Temperature (F): 98.3 Height (in): 71 Pulse (bpm): 76 Weight (  lbs): 209 Respiratory Rate (breaths/min): 18 Body Mass Index (BMI): 29.1 Blood Pressure (mmHg): 105/70 Reference Range: 80 - 120 mg / dl Electronic Signature(s) Signed:  11/03/2019 3:02:15 PM By: Sandre Kitty Entered By: Sandre Kitty on 09/16/2019 15:15:47

## 2019-11-03 NOTE — Progress Notes (Signed)
Jeffrey [name], Jeffrey [name] ([id number]) Visit Report for 09/09/2019 Arrival Information Details Patient Name: Date of Service: Jeffrey [name], Jeffrey [name] 09/09/2019 2:00 PM Medical Record [id number] Patient Account Number: [id number] Date of Birth/Sex: Treating RN: 10-31-1941 (78 y.o. Jeffrey [name]) Carlene Coria Primary Care Phoenix Riesen: Kennith Maes Other Clinician: Referring Tanea Moga: Treating Bao Bazen/Extender:[name], [name], [name]: 9 Visit Information History Since Last Visit All ordered tests and consults were completed: No Patient Arrived: Wheel Chair Added or deleted any medications: No Arrival Time: 14:33 Any new allergies or adverse reactions: No Accompanied By: son Had a fall or experienced change in No Transfer Assistance: None activities of daily living that may affect Patient Identification Verified: Yes risk of falls: Secondary Verification Process Yes Signs or symptoms of abuse/neglect since last No Completed: visito Patient Requires Transmission- No Hospitalized since last visit: No Based Precautions: Implantable device outside of the clinic excluding No Patient Has Alerts: Yes cellular tissue based products placed in the center Patient Alerts: Patient on Blood since last visit: Thinner Has Dressing in Place as Prescribed: Yes R ABI = .44, R Has Compression in Place as Prescribed: Yes TBI= .43 Pain Present Now: No Electronic Signature(s) Signed: 11/03/2019 3:01:15 PM By: Carlene Coria RN Entered By: Carlene Coria on 09/09/2019 14:33:26 -------------------------------------------------------------------------------- Clinic Level of Care Assessment Details Patient Name: Date of Service: [name], [name] 09/09/2019 2:00 PM Medical Record [id number] Patient Account Number: [id number] Date of Birth/Sex: Treating RN: 16-Oct-1941 (78 y.o. Jeffrey [name] Primary Care Enisa Runyan: Kennith Maes Other Clinician: Referring Ondria Oswald: Treating  Alexxander Kurt/Extender:[name], [name], [name]: 9 Clinic Level of Care Assessment Items TOOL 4 Quantity Score X - Use when only an EandM is performed on FOLLOW-UP visit 1 0 ASSESSMENTS - Nursing Assessment / Reassessment X - Reassessment of Co-morbidities (includes updates in patient status) 1 10 X - Reassessment of Adherence to Treatment Plan 1 5 ASSESSMENTS - Wound and Skin Assessment / Reassessment _0  - Simple Wound Assessment / Reassessment - one wound 0 X - Complex Wound Assessment / Reassessment - multiple wounds 5 5 X - Dermatologic / Skin Assessment (not related to wound area) 1 10 ASSESSMENTS - Focused Assessment X - Circumferential Edema Measurements - multi extremities 1 5 X - Nutritional Assessment / Counseling / Intervention 1 10 _1  - Lower Extremity Assessment (monofilament, tuning fork, pulses) 0 _2  - Peripheral Arterial Disease Assessment (using hand held doppler) 0 ASSESSMENTS - Ostomy and/or Continence Assessment and Care _3  - Incontinence Assessment and Management 0 _4  - Ostomy Care Assessment and Management (repouching, etc.) 0 PROCESS - Coordination of Care _5  - Simple Patient / Family Education for ongoing care 0 X - Complex (extensive) Patient / Family Education for ongoing care 1 20 X - Staff obtains Programmer, systems, Records, Test Results / Process Orders 1 10 X - Staff telephones HHA, Nursing Homes / Clarify orders / etc 1 10 _6  - Routine Transfer to another Facility (non-emergent condition) 0 _7  - Routine Hospital Admission (non-emergent condition) 0 _8  - New Admissions / Biomedical engineer / Ordering NPWT, Apligraf, etc. 0 _9  - Emergency Hospital Admission (emergent condition) 0 _10  - Simple Discharge Coordination 0 X - Complex (extensive) Discharge Coordination 1 15 PROCESS - Special Needs _11  - Pediatric / Minor Patient Management 0 _12  - Isolation Patient Management 0 _13  - Hearing / Language / Visual special needs 0 _14  - Assessment  of Community assistance (transportation, D/C planning, etc.) 0 _15  - Additional assistance / Altered mentation 0 _16  - Support Surface(s)  Assessment (bed, cushion, seat, etc.) 0 INTERVENTIONS - Wound Cleansing / Measurement _0  - Simple Wound Cleansing - one wound 0 X - Complex Wound Cleansing - multiple wounds 5 5 X - Wound Imaging (photographs - any number of wounds) 1 5 _1  - Wound Tracing (instead of photographs) 0 _2  - Simple Wound Measurement - one wound 0 X - Complex Wound Measurement - multiple wounds 5 5 INTERVENTIONS - Wound Dressings X - Small Wound Dressing one or multiple wounds 1 10 X - Medium Wound Dressing one or multiple wounds 1 15 X - Large Wound Dressing one or multiple wounds 1 20 <EXHBZJIRCVELFYBO>_1<\/BPZWCHENIDPOEUMP>_5  - Application of Medications - topical 0 <TIRWERXVQMGQQPYP>_9<\/JKDTOIZTIWPYKDXI>_3  - Application of Medications - injection 0 INTERVENTIONS - Miscellaneous _5  - External ear exam 0 _6  - Specimen Collection (cultures, biopsies, blood, body fluids, etc.) 0 _7  - Specimen(s) / Culture(s) sent or taken to Lab for analysis 0 _8  - Patient Transfer (multiple staff / Civil Service fast streamer / Similar devices) 0 _9  - Simple Staple / Suture removal (25 or less) 0 _10  - Complex Staple / Suture removal (26 or more) 0 _11  - Hypo / Hyperglycemic Management (close monitor of Blood Glucose) 0 _12  - Ankle / Brachial Index (ABI) - do not check if billed separately 0 X - Vital Signs 1 5 Has the patient been seen at the hospital within the last three years: Yes Total Score: 225 Level Of Care: New/Established - Level 5 Electronic Signature(s) Signed: 09/09/2019 5:58:48 PM By: Deon Pilling Entered By: Deon Pilling on 09/09/2019 15:06:10 -------------------------------------------------------------------------------- Encounter Discharge Information Details Patient Name: Date of Service: Jeffrey [name] 09/09/2019 2:00 PM Medical Record [id number] Patient Account Number: [id number] Date of Birth/Sex: Treating RN: September 26, 1941 (78 y.o. Marvis Repress Primary Care Mckinzi Eriksen: Kennith Maes Other Clinician: Referring Rashard Ryle: Treating Nova Evett/Extender:[name], [name], [name]: 9 Encounter Discharge Information Items Discharge Condition: Stable Ambulatory Status: Wheelchair Discharge Destination: Home Transportation: Private Auto Accompanied By: caregiver Schedule Follow-up Appointment: Yes Clinical Summary of Care: Patient Declined Electronic Signature(s) Signed: 09/09/2019 5:37:20 PM By: Kela Millin Entered By: Kela Millin on 09/09/2019 15:27:34 -------------------------------------------------------------------------------- Lower Extremity Assessment Details Patient Name: Date of Service: Jeffrey [name], Jeffrey [name] 09/09/2019 2:00 PM Medical Record [id number] Patient Account Number: [id number] Date of Birth/Sex: Treating RN: [birth year] (78 y.o. Oval Linsey Primary Care Natara Monfort: Kennith Maes Other Clinician: Referring Marrisa Kimber: Treating Abenezer Odonell/Extender:[name], [name], [name]: 9 Edema Assessment Assessed: [Left: No] [Right: Yes] Edema: [Left: Ye] [Right: s] Calf Left: Right: Point of Measurement: 27 cm From Medial Instep cm 40 cm Ankle Left: Right: Point of Measurement: 10 cm From Medial Instep cm 23 cm Electronic Signature(s) Signed: 11/03/2019 3:01:15 PM By: Carlene Coria RN Entered By: Carlene Coria on 09/09/2019 14:34:18 -------------------------------------------------------------------------------- Multi-Disciplinary Care Plan Details Patient Name: Date of Service: Jeffrey [name] 09/09/2019 2:00 PM Medical Record [id number] Patient Account Number: [id number] Date of Birth/Sex: Treating RN: 16-Oct-1941 (78 y.o. Jeffrey [name] Primary Care Sherrilyn Nairn: Kennith Maes Other Clinician: Referring Shakesha Soltau: Treating Rasheem Figiel/Extender:[name], [name], [name]: 9 Active Inactive Wound/Skin Impairment Nursing  Diagnoses: Impaired tissue integrity Knowledge deficit related to ulceration/compromised skin integrity Goals: Patient/caregiver will verbalize understanding of skin care regimen Date Initiated: 07/08/2019 Target Resolution Date: 09/30/2019 Goal Status: Active Ulcer/skin breakdown will have a volume reduction of 30% by week 4 Date Initiated: 07/08/2019 Date Inactivated: 08/05/2019 Target Resolution Date: 08/05/2019 Goal Status: Met Ulcer/skin breakdown will have a volume reduction of 50% by week 8 Date  Initiated: 08/05/2019 Date Inactivated: 09/02/2019 Target Resolution Date: 09/04/2019 Unmet Reason: multiple Goal Status: Unmet comorbidities Ulcer/skin breakdown will have a volume reduction of 80% by week 12 Date Initiated: 09/02/2019 Target Resolution Date: 09/30/2019 Goal Status: Active Interventions: Assess patient/caregiver ability to obtain necessary supplies Assess patient/caregiver ability to perform ulcer/skin care regimen upon admission and as needed Assess ulceration(s) every visit Treatment Activities: Skin care regimen initiated : 07/08/2019 Topical wound management initiated : 07/08/2019 Notes: Electronic Signature(s) Signed: 09/09/2019 5:58:48 PM By: Deon Pilling Entered By: Deon Pilling on 09/09/2019 15:04:33 -------------------------------------------------------------------------------- Pain Assessment Details Patient Name: Date of Service: Jeffrey [name], Jeffrey [name] 09/09/2019 2:00 PM Medical Record [id number] Patient Account Number: [id number] Date of Birth/Sex: Treating RN: 1940-12-26 (78 y.o. Jeffrey [name]) Carlene Coria Primary Care Amaryah Mallen: Kennith Maes Other Clinician: Referring Trajon Rosete: Treating Graig Hessling/Extender:[name], [name], [name]: 9 Active Problems Location of Pain Severity and Description of Pain Patient Has Paino No Site Locations Pain Management and Medication Current Pain Management: Electronic Signature(s) Signed: 11/03/2019  3:01:15 PM By: Carlene Coria RN Entered By: Carlene Coria on 09/09/2019 14:33:58 -------------------------------------------------------------------------------- Patient/Caregiver Education Details Patient Name: Jeffrey [name] 10/14/2020andnbsp2:00 Date of Service: PM Medical Record [id number] Number: Patient Account Number: [id number] Treating RN: Apr 26, 1941 (78 y.o. Deon Pilling Date of Birth/Gender: M) Other Clinician: Primary Care Physician: Kennith Maes Treating Worthy Keeler Referring Physician: Physician/Extender: Clearence Cheek in Treatment: 9 Education Assessment Education Provided To: Patient and Caregiver Education Topics Provided Wound/Skin Impairment: Handouts: Skin Care Do's and Dont's Methods: Explain/Verbal Responses: Reinforcements needed Electronic Signature(s) Signed: 09/09/2019 5:58:48 PM By: Deon Pilling Entered By: Deon Pilling on 09/09/2019 15:04:54 -------------------------------------------------------------------------------- Wound Assessment Details Patient Name: Date of Service: Jeffrey [name], Jeffrey [name] 09/09/2019 2:00 PM Medical Record [id number] Patient Account Number: [id number] Date of Birth/Sex: Treating RN: December 05, 1940 (78 y.o. Jeffrey [name]) Carlene Coria Primary Care Kamariya Blevens: Kennith Maes Other Clinician: Referring Tharon Bomar: Treating Nalu Troublefield/Extender:[name], [name], [name]: 9 Wound Status Wound Number: 1 Primary Venous Leg Ulcer Etiology: Wound Location: Right Malleolus - Lateral Wound Open Wounding Event: Gradually Appeared Status: Date Acquired: 06/26/2019 Comorbid Cataracts, Chronic Obstructive Pulmonary Weeks Of Treatment: 9 History: Disease (COPD), Congestive Heart Failure, Clustered Wound: No Hypertension, Myocardial Infarction, Peripheral Venous Disease, Type II Diabetes, End Stage Renal Disease, Osteomyelitis Photos Wound Measurements Length: (cm) 1.2 % Reductio Width: (cm) 1 %  Reductio Depth: (cm) 0.3 Epithelial Area: (cm) 0.942 Tunneling Volume: (cm) 0.283 Undermini Wound Description Classification: Full Thickness Without Exposed Support Foul Od Structures Slough/ Wound Distinct, outline attached Margin: Exudate Small Amount: Exudate Serosanguineous Type: Exudate red, brown Color: Wound Bed Granulation Amount: Medium (34-66%) Granulation Quality: Pink Fascia Necrotic Amount: Medium (34-66%) Fat Lay Necrotic Quality: Adherent Slough Tendon Muscle Joint E Bone Ex or After Cleansing: No Fibrino Yes Exposed Structure Exposed: No er (Subcutaneous Tissue) Exposed: Yes Exposed: No Exposed: No xposed: No posed: No n in Area: -166.9% n in Volume: -708.6% ization: None : No ng: No Electronic Signature(s) Signed: 09/15/2019 1:32:19 PM By: Mikeal Hawthorne EMT/HBOT Signed: 11/03/2019 3:01:15 PM By: Carlene Coria RN Entered By: Mikeal Hawthorne on 09/10/2019 08:46:54 -------------------------------------------------------------------------------- Wound Assessment Details Patient Name: Date of Service: Jeffrey [name] 09/09/2019 2:00 PM Medical Record [id number] Patient Account Number: [id number] Date of Birth/Sex: Treating RN: 1941-07-16 (78 y.o. Oval Linsey Primary Care Gurveer Colucci: Kennith Maes Other Clinician: Referring Stellarose Cerny: Treating Malick Netz/Extender:[name], [name], [name]: 9 Wound Status Wound Number: 4 Primary Venous Leg Ulcer Etiology: Wound Location: Right Lower Leg - Medial  Wound Open Wounding Event: Blister Status: Date Acquired: 07/10/2019 Comorbid Cataracts, Chronic Obstructive Pulmonary Weeks Of Treatment: 8 History: Disease (COPD), Congestive Heart Failure, Clustered Wound: No Hypertension, Myocardial Infarction, Peripheral Venous Disease, Type II Diabetes, End Stage Renal Disease, Osteomyelitis Photos Wound Measurements Length: (cm) 3 % Reduct Width: (cm) 3 % Reduct Depth:  (cm) 0.1 Epitheli Area: (cm) 7.069 Tunneli Volume: (cm) 0.707 Undermi Wound Description Classification: Full Thickness Without Exposed Support Structures Wound Distinct, outline attached Margin: Exudate Medium Amount: Exudate Serosanguineous Type: Exudate red, brown Color: Wound Bed Granulation Amount: Large (67-100%) Granulation Quality: Red Necrotic Amount: Small (1-33%) Necrotic Quality: Adherent Slough Foul Odor After Cleansing: No Slough/Fibrino Yes Exposed Structure Fascia Exposed: No Fat Layer (Subcutaneous Tissue) Exposed: Yes Tendon Exposed: No Muscle Exposed: No Joint Exposed: No Bone Exposed: No ion in Area: -11.1% ion in Volume: -11.2% alization: None ng: No ning: No Electronic Signature(s) Signed: 09/15/2019 1:32:19 PM By: Mikeal Hawthorne EMT/HBOT Signed: 11/03/2019 3:01:15 PM By: Carlene Coria RN Entered By: Mikeal Hawthorne on 09/10/2019 08:45:22 -------------------------------------------------------------------------------- Wound Assessment Details Patient Name: Date of Service: Jeffrey [name] 09/09/2019 2:00 PM Medical Record [id number] Patient Account Number: [id number] Date of Birth/Sex: Treating RN: [birth year] (78 y.o. 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 Secondary Inflammatory Wounding Event: Gradually Appeared Etiology: Date Acquired: 08/12/2019 Wound Open Weeks Of Treatment: 4 Status: Clustered Wound: Yes Comorbid Cataracts, Chronic Obstructive Pulmonary History: Disease (COPD), Congestive Heart Failure, Hypertension, Myocardial Infarction, Peripheral Venous Disease, Type II Diabetes, End Stage Renal Disease, Osteomyelitis Photos Wound Measurements Length: (cm) 0.5 %  Reduction Width: (cm) 2.6 % Reduction Depth: (cm) 0.1 Epitheliali Clustered Quantity: 2 Tunneling: Area: (cm) 1.021 Underminin Volume: (cm) 0.102 Wound Description Classification: Partial Thickness Wound Margin: Distinct, outline attached Exudate Amount: Small Exudate Type: Serosanguineous Exudate Color: red, brown Wound Bed Granulation Amount: Large (67-100%) Necrotic Amount: None Present (0%) Foul Odor After Cleansing: No Slough/Fibrino No Exposed Structure Fascia Exposed: No Fat Layer (Subcutaneous Tissue) Exposed: Yes Tendon Exposed: No Muscle Exposed: No Joint Exposed: No Bone Exposed: No in Area: 89.2% in Volume: 89.2% zation: Medium (34-66%) No g: No Electronic Signature(s) Signed: 09/15/2019 1:32:19 PM By: Mikeal Hawthorne EMT/HBOT Signed: 11/03/2019 3:01:15 PM By: Carlene Coria RN Entered By: Mikeal Hawthorne on 09/10/2019 08:47:11 -------------------------------------------------------------------------------- Wound Assessment Details Patient Name: Date of Service: Jeffrey [name] 09/09/2019 2:00 PM Medical Record [id number] Patient Account Number: [id number] Date of Birth/Sex: Treating RN: 1941-05-11 (78 y.o. Jeffrey [name]) Carlene Coria Primary Care Kyrene Longan: Kennith Maes Other Clinician: Referring Nara Paternoster: Treating Mykelti Goldenstein/Extender:[name], [name], [name]: 9 Wound Status Wound Number: 6 Primary Venous Leg Ulcer Etiology: Wound Location: Right Lower Leg - Lateral Wound Open Wounding Event: Gradually Appeared Status: Date Acquired: 08/12/2019 Comorbid Cataracts, Chronic Obstructive Pulmonary Weeks Of Treatment: 4 History: Disease (COPD), Congestive Heart Failure, Clustered Wound: No Hypertension, Myocardial Infarction, Peripheral Venous Disease, Type II Diabetes, End Stage Renal Disease, Osteomyelitis Photos Wound Measurements Length: (cm) 11.5 % Reduct Width: (cm) 5 % Reduct Depth: (cm) 0.1 Epitheli Area: (cm) 45.16  Tunneli Volume: (cm) 4.516 Undermi Wound Description Classification: Full Thickness Without Exposed Support Foul Od Structures Slough/ Wound Distinct, outline attached Margin: Exudate Medium Amount: Exudate Serosanguineous Type: Exudate red, brown Color: Wound Bed Granulation Amount: Large (67-100%) Granulation Quality: Red Fascia Necrotic Amount: Small (1-33%) Fat Lay Necrotic Quality: Adherent  Slough Tendon Muscle Expos Joint Expose Bone Exposed or After Cleansing: No Fibrino Yes Exposed Structure Exposed: No er (Subcutaneous Tissue) Exposed: Yes Exposed: No ed: No d: No : No ion in Area: -2774.6% ion in Volume: -2776.4% alization: Large (67-100%) ng: No ning: No Electronic Signature(s) Signed: 09/15/2019 1:32:19 PM By: Mikeal Hawthorne EMT/HBOT Signed: 11/03/2019 3:01:15 PM By: Carlene Coria RN Entered By: Mikeal Hawthorne on 09/10/2019 08:46:37 -------------------------------------------------------------------------------- Wound Assessment Details Patient Name: Date of Service: Jeffrey [name] 09/09/2019 2:00 PM Medical Record [id number] Patient Account Number: [id number] Date of Birth/Sex: Treating RN: 18-Apr-1941 (78 y.o. Jeffrey [name]) Carlene Coria Primary Care Young Brim: Kennith Maes Other Clinician: Referring Elisabel Hanover: Treating Brynlynn Walko/Extender:[name], [name], [name]: 9 Wound Status Wound Number: 7 Primary Diabetic Wound/Ulcer of the Lower Extremity Etiology: Wound Location: Right Toe Second Wound Open Wounding Event: Gradually Appeared Status: Date Acquired: 08/14/2019 Comorbid Cataracts, Chronic Obstructive Pulmonary Weeks Of Treatment: 2 History: Disease (COPD), Congestive Heart Failure, Clustered Wound: No Hypertension, Myocardial Infarction, Peripheral Venous Disease, Type II Diabetes, End Stage Renal Disease, Osteomyelitis Photos Wound Measurements Length: (cm) 1.1 % Reduction Width: (cm) 1.2 % Reduction Depth:  (cm) 0.1 Epithelializ Area: (cm) 1.037 Tunneling: Volume: (cm) 0.104 Undermining Wound Description Classification: Grade 2 Wound Margin: Distinct, outline attached Exudate Amount: Small Exudate Type: Serosanguineous Exudate Color: red, brown Wound Bed Granulation Amount: None Present (0%) Necrotic Amount: Large (67-100%) Necrotic Quality: Eschar, Adherent Slough Foul Odor After Cleansing: No Slough/Fibrino Yes Exposed Structure Fascia Exposed: No Fat Layer (Subcutaneous Tissue) Exposed: No Tendon Exposed: No Muscle Exposed: No Joint Exposed: No Bone Exposed: No in Area: -46.7% in Volume: 26.2% ation: None No : No Electronic Signature(s) Signed: 09/15/2019 1:32:19 PM By: Mikeal Hawthorne EMT/HBOT Signed: 11/03/2019 3:01:15 PM By: Carlene Coria RN Entered By: Mikeal Hawthorne on 09/10/2019 08:45:42 -------------------------------------------------------------------------------- Vitals Details Patient Name: Date of Service: Jeffrey [name] 09/09/2019 2:00 PM Medical Record [id number] Patient Account Number: [id number] Date of Birth/Sex: Treating RN: 1941-09-06 (78 y.o. Oval Linsey Primary Care Zykia Walla: Kennith Maes Other Clinician: Referring Kary Colaizzi: Treating Lorien Shingler/Extender:[name], [name], [name]: 9 Vital Signs Time Taken: 14:33 Temperature (F): 97.5 Height (in): 71 Pulse (bpm): 76 Weight (lbs): 209 Respiratory Rate (breaths/min): 18 Body Mass Index (BMI): 29.1 Blood Pressure (mmHg): 105/70 Reference Range: 80 - 120 mg / dl Electronic Signature(s) Signed: 11/03/2019 3:01:15 PM By: Carlene Coria RN Entered By: Carlene Coria on 09/09/2019 14:33:47

## 2019-11-23 ENCOUNTER — Telehealth: Payer: Self-pay | Admitting: Emergency Medicine

## 2019-11-23 NOTE — Telephone Encounter (Signed)
Wife called and reported patient is deceased. Condolences given. Patient date of death 11/13/19. Wife reports she had contacted office to notify of death. No note in Epic. Patient discontinued in [place].

## 2019-11-23 NOTE — Telephone Encounter (Signed)
LMOM. Alert for no communication with [employer] monitor for 21 days.

## 2019-11-24 NOTE — Progress Notes (Signed)
PPM remote 

## 2019-11-27 DEATH — deceased

## 2020-01-05 IMAGING — DX DG CHEST 1V PORT
1 series · 1 of 1 positions shown · non-contrast
Comparison: 08/15/2019

CLINICAL DATA: Shortness of breath

EXAM:
PORTABLE CHEST 1 VIEW

[chest ap]
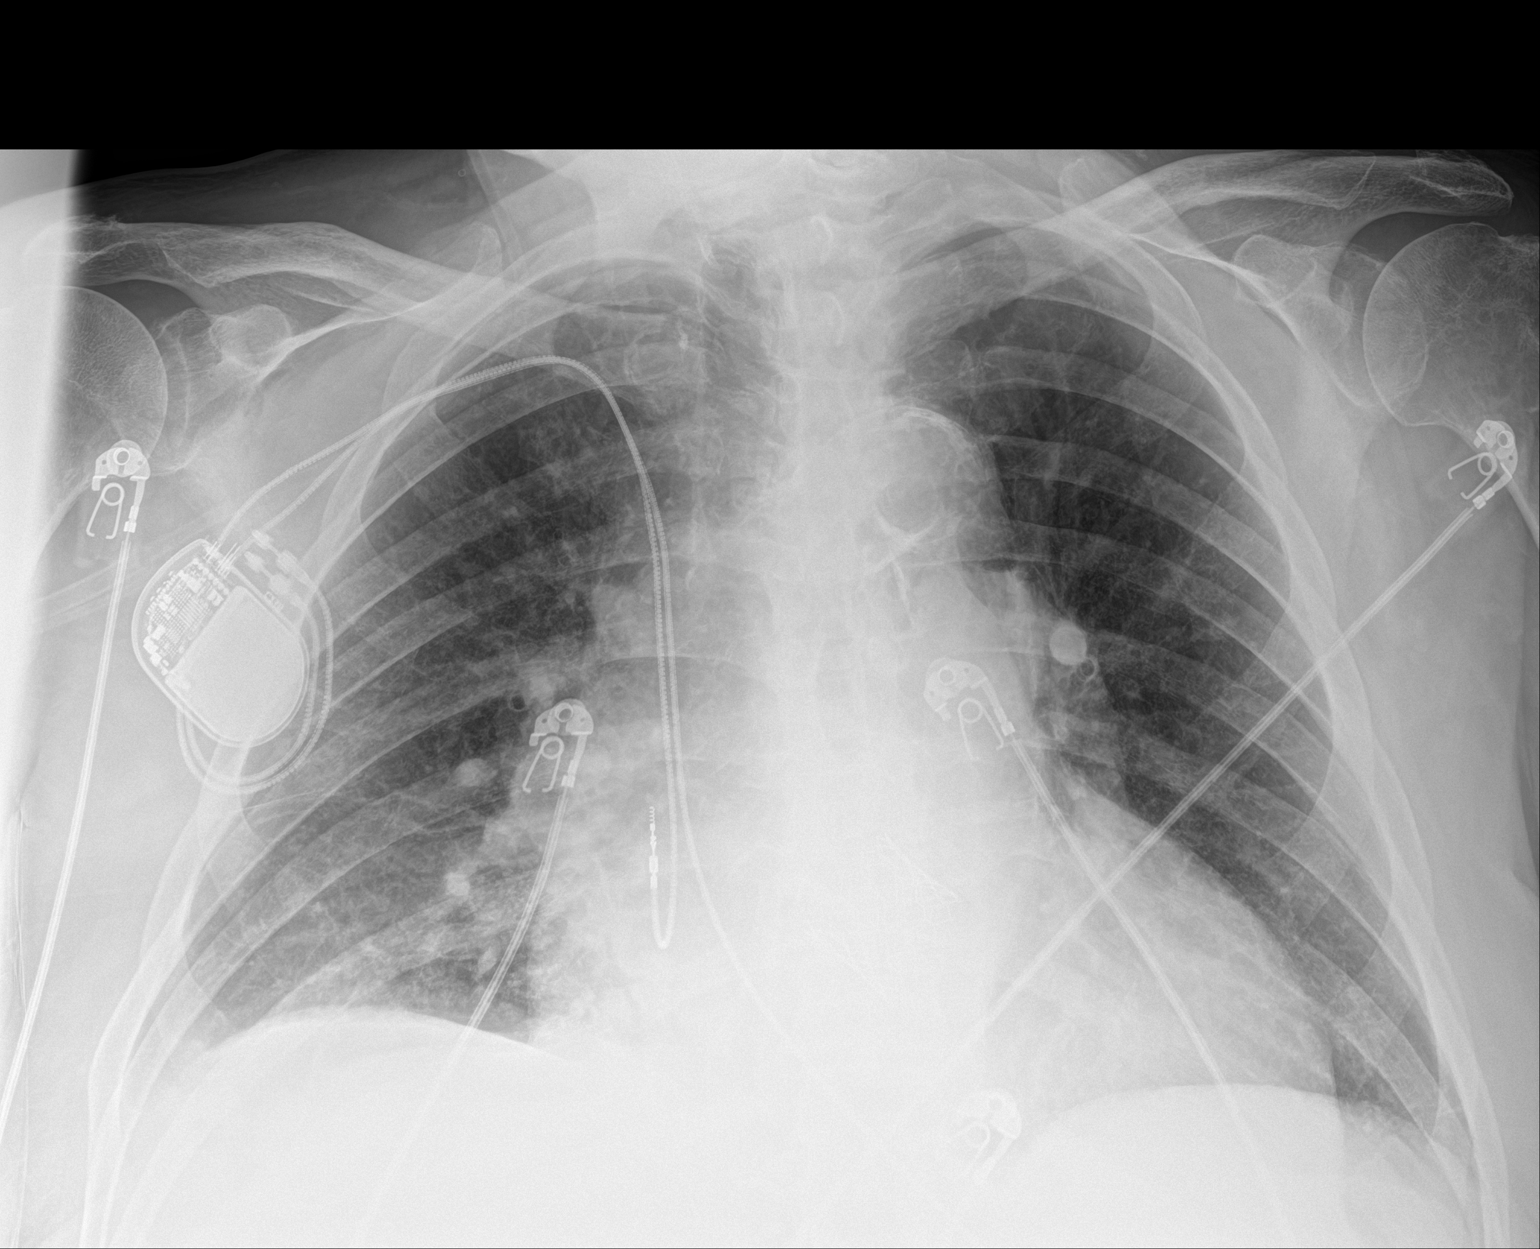

[1 of 1 positions shown; findings below may reference images not displayed]

FINDINGS: There is a dual chamber right-sided pacemaker in place. The heart
size is enlarged. Aortic calcifications are noted. There is no
pneumothorax. No acute osseous abnormality. There is a trace
right-sided pleural effusion.
IMPRESSION: No active disease.

## 2020-01-28 NOTE — Telephone Encounter (Signed)
Called and spoke to his wife-- unfortunate events-- discharged and died that night-- she was concerned about his having had a stroke but was told "he was discharged"
